# Patient Record
Sex: Male | Born: 1990 | Race: White | Hispanic: No | Marital: Single | Smoking: Current every day smoker
Health system: Southern US, Community
[De-identification: ages and names within clinical notes are randomized; demographics above are authoritative.]

## PROBLEM LIST (undated history)

## (undated) ENCOUNTER — Ambulatory Visit (HOSPITAL_COMMUNITY): Admission: EM | Payer: MEDICAID

## (undated) ENCOUNTER — Ambulatory Visit (HOSPITAL_COMMUNITY): Payer: 59

## (undated) DIAGNOSIS — K2920 Alcoholic gastritis without bleeding: Secondary | ICD-10-CM

## (undated) DIAGNOSIS — F329 Major depressive disorder, single episode, unspecified: Secondary | ICD-10-CM

## (undated) DIAGNOSIS — R443 Hallucinations, unspecified: Secondary | ICD-10-CM

## (undated) DIAGNOSIS — F32A Depression, unspecified: Secondary | ICD-10-CM

## (undated) DIAGNOSIS — F1010 Alcohol abuse, uncomplicated: Secondary | ICD-10-CM

## (undated) DIAGNOSIS — F259 Schizoaffective disorder, unspecified: Secondary | ICD-10-CM

## (undated) DIAGNOSIS — F319 Bipolar disorder, unspecified: Secondary | ICD-10-CM

## (undated) DIAGNOSIS — F1910 Other psychoactive substance abuse, uncomplicated: Secondary | ICD-10-CM

## (undated) DIAGNOSIS — K047 Periapical abscess without sinus: Secondary | ICD-10-CM

## (undated) DIAGNOSIS — I10 Essential (primary) hypertension: Secondary | ICD-10-CM

---
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
## 2012-10-10 ENCOUNTER — Encounter (HOSPITAL_COMMUNITY): Payer: Self-pay | Admitting: *Deleted

## 2012-10-10 ENCOUNTER — Emergency Department (HOSPITAL_COMMUNITY)
Admission: EM | Admit: 2012-10-10 | Discharge: 2012-10-10 | Disposition: A | Discharge status: Home / Self Care | Payer: Self-pay | Attending: Emergency Medicine | Admitting: Emergency Medicine

## 2012-10-10 DIAGNOSIS — K047 Periapical abscess without sinus: Secondary | ICD-10-CM | POA: Insufficient documentation

## 2012-10-10 DIAGNOSIS — F17200 Nicotine dependence, unspecified, uncomplicated: Secondary | ICD-10-CM | POA: Insufficient documentation

## 2012-10-10 MED ORDER — PENICILLIN V POTASSIUM 500 MG PO TABS: 500.0000 mg | ORAL_TABLET | Freq: Three times a day (TID) | ORAL | Status: DC

## 2012-10-10 NOTE — ED Notes (Addendum)
Left side of upper lip hard and swollen for 2-3 days. Airway patent. A&Ox4, ambulatory, stable. Pt reports hx of abscesses to gums on both sides of mouth.

## 2012-10-10 NOTE — ED Notes (Signed)
Reports having left upper lip swelling x 2 days, thinks its related to dental abscess. Airway is intact.

## 2012-10-10 NOTE — ED Provider Notes (Signed)
Medical screening examination/treatment/procedure(s) were performed by non-physician practitioner and as supervising physician I was immediately available for consultation/collaboration.  [name]. Rubin Payor, MD 10/10/12 [phone]

## 2012-10-10 NOTE — ED Provider Notes (Signed)
History   This chart was scribed for non-physician practitioner working with Mark Hammond. Mark Payor, MD by Mark Hammond, ED Scribe. This patient was seen in room TR08C/TR08C and the patient's care was started at 3:06 PM.    CSN: [id number]  Arrival date & time 10/10/12  1349   First MD Initiated Contact with Patient 10/10/12 1501      Chief Complaint  Patient presents with  . Oral Swelling    The history is provided by the patient. No language interpreter was used.   Mark Hammond is a 22 y.o. male who presents to the Emergency Department complaining of 2.5 days of upper lip swelling with one month of recurrent "abscess bubbles" that have previously been drained but recently they have not been able to drain.  Pt has not seen a dentist and has not tried any antibiotics.  Pt denies constant pain but states area is tender to touch.  No fever.  Pt has Codeine allergy.  Pt is a current everyday smoker but denies alcohol use.   History reviewed. No pertinent past medical history.  History reviewed. No pertinent past surgical history.  History reviewed. No pertinent family history.  History  Substance Use Topics  . Smoking status: Current Every Day Smoker    Types: Cigarettes  . Smokeless tobacco: Not on file  . Alcohol Use: No      Review of Systems  Constitutional: Negative for fever.  HENT: Positive for dental problem.     Allergies  Codeine  Home Medications  No current outpatient prescriptions on file.  BP 118/68  Pulse 73  Temp 97.8 F (36.6 C) (Oral)  Resp 18  SpO2 100%  Physical Exam  Nursing note and vitals reviewed. Constitutional: He is oriented to person, place, and time. He appears well-developed and well-nourished. No distress.  HENT:  Head: Normocephalic and atraumatic.  Mouth/Throat:         Mild swelling to upper lip, very poor dentition throughout, upper left rear molar has multiple carries, no signs of peritonsillar abscess, ludwig's angina.  Airway is  intact.  Eyes: Conjunctivae normal are normal.  Neck: Neck supple. No tracheal deviation present.  Cardiovascular: Normal rate.   Pulmonary/Chest: Effort normal. No respiratory distress.  Musculoskeletal: Normal range of motion.  Neurological: He is alert and oriented to person, place, and time.  Skin: Skin is warm and dry.  Psychiatric: He has a normal mood and affect. His behavior is normal.    ED Course  Procedures (including critical care time) DIAGNOSTIC STUDIES: Oxygen Saturation is 100% on room air, normal by my interpretation.    COORDINATION OF CARE: 3:09 PM- Patient informed of clinical course, understands medical decision-making process, and agrees with plan.  Discussed prescribing penicillin here and using ibuprofen/tylenol for pain management.  Discussed follow-up with dentist.      1. Dental abscess       MDM    I personally performed the services described in this documentation, which was scribed in my presence. The recorded information has been reviewed and is accurate.         Roxy Horseman, PA-C 10/10/12 2107

## 2012-11-07 ENCOUNTER — Encounter (HOSPITAL_COMMUNITY): Payer: Self-pay

## 2012-11-07 ENCOUNTER — Emergency Department (HOSPITAL_COMMUNITY)
Admission: EM | Admit: 2012-11-07 | Discharge: 2012-11-07 | Disposition: A | Discharge status: Home / Self Care | Payer: Self-pay | Attending: Emergency Medicine | Admitting: Emergency Medicine

## 2012-11-07 DIAGNOSIS — F17200 Nicotine dependence, unspecified, uncomplicated: Secondary | ICD-10-CM | POA: Insufficient documentation

## 2012-11-07 DIAGNOSIS — R599 Enlarged lymph nodes, unspecified: Secondary | ICD-10-CM | POA: Insufficient documentation

## 2012-11-07 DIAGNOSIS — R590 Localized enlarged lymph nodes: Secondary | ICD-10-CM

## 2012-11-07 DIAGNOSIS — K029 Dental caries, unspecified: Secondary | ICD-10-CM | POA: Insufficient documentation

## 2012-11-07 HISTORY — DX: Periapical abscess without sinus: K04.7

## 2012-11-07 MED ORDER — IBUPROFEN 400 MG PO TABS
600.0000 mg | ORAL_TABLET | Freq: Once | ORAL | Status: AC
  Administered 2012-11-07: 600 mg via ORAL
  Filled 2012-11-07: qty 2

## 2012-11-07 NOTE — ED Provider Notes (Signed)
History     CSN: [id number]  Arrival date & time 11/07/12  0118   First MD Initiated Contact with Patient 11/07/12 0130      Chief Complaint  Patient presents with  . Lymphadenopathy    (Consider location/radiation/quality/duration/timing/severity/associated sxs/prior treatment) HPIAdam Hammond is a 22 y.o. male presents to the emergency department for a small "bump" on the underside of his mandible. Patient was seen here in the ER 10/10/12 for a dental abscess and completed a course of penicillin. Patient denies any shortness of breath, difficulty breathing, swelling in his tongue or throat. Patient says he has mild sore throat when he swallows, and is mild, nonradiating irritation when he pushes on the bump under his jaw. He has no other medical history. He says he's not been able to followup with a dentist due to cost. Patient has not taken anything for the mild irritation    Past Medical History  Diagnosis Date  . Dental abscess     History reviewed. No pertinent past surgical history.  No family history on file.  History  Substance Use Topics  . Smoking status: Current Every Day Smoker    Types: Cigarettes  . Smokeless tobacco: Not on file  . Alcohol Use: No      Review of Systems At least 10pt or greater review of systems completed and are negative except where specified in the HPI.  Allergies  Codeine  Home Medications  No current outpatient prescriptions on file.  BP 120/69  Pulse 102  Temp 98.7 F (37.1 C) (Oral)  Resp 15  SpO2 98%  Physical Exam  Nursing notes reviewed.  Electronic medical record reviewed. VITAL SIGNS:   Filed Vitals:   11/07/12 0126  BP: 120/69  Pulse: 102  Temp: 98.7 F (37.1 C)  TempSrc: Oral  Resp: 15  SpO2: 98%   CONSTITUTIONAL: Awake, oriented, appears non-toxic. Smells of marijuana HENT: Atraumatic, normocephalic, oral mucosa pink and moist, airway patent. Dental caries. #30 is broken off at the gumline, no swelling of  the airway, no abscess, no Ludwig's angina, no RPA, no PTA. Nares patent without drainage. External ears normal. EYES: Conjunctiva clear, EOMI, PERRLA NECK: Trachea midline, non-tender, supple. Mild mostly right-sided submandibular shotty, mildly tender lymphadenopathy CARDIOVASCULAR: Normal heart rate, Normal rhythm, No murmurs, rubs, gallops PULMONARY/CHEST: Clear to auscultation, no rhonchi, wheezes, or rales. Symmetrical breath sounds. Non-tender. ABDOMINAL: Non-distended, soft, non-tender - no rebound or guarding.  BS normal. NEUROLOGIC: Non-focal, moving all four extremities, no gross sensory or motor deficits. EXTREMITIES: No clubbing, cyanosis, or edema SKIN: Warm, Dry, No erythema, No rash  ED Course  Procedures (including critical care time)  Labs Reviewed - No data to display No results found.   1. Lymphadenopathy, submandibular   2. Dental caries       MDM  Mark Hammond is a 22 y.o. male presents with some shotty lymphadenopathy in the submandibular nodes, patient has poor dentition and needs some dental work. Does not appear to have an abscess at this time, he just completed a course of penicillin-I. do not think he needs further antibiotic treatment. Besides a mildly elevated pulse of 102-patient's vital signs are within normal limits, he is afebrile and nontoxic.  Does not appear to have any severe or serious ENT infection. Patient will be given resources to both the [facility] as well as mother private dental clinic that operates for a reduced cost.  Patient to use ibuprofen as needed for discomfort, he says his  pain is mild and has not taken anything for it.  I explained the diagnosis and have given explicit precautions to return to the ER including difficulty breathing, mouth swelling, throat or tongue swelling, fevers or chills or any other new or worsening symptoms. The patient understands and accepts the medical plan as it's been dictated and I have  answered their questions. Discharge instructions concerning home care given.  The patient is STABLE and is discharged to home in good condition.         Jones Skene, MD 11/07/12 [phone]

## 2012-11-07 NOTE — ED Notes (Signed)
Pt presents with c/o a "knot" to left neck (?swollen lymph node) x 1 day. Was seen here 10/10/12 for dental abscess. Rx PCN in which pt stated he completed. Pt endorses " a little bit" of neck pain. No fevers, sweats or chills.

## 2013-02-03 ENCOUNTER — Encounter (HOSPITAL_COMMUNITY): Payer: Self-pay | Admitting: *Deleted

## 2013-02-03 ENCOUNTER — Emergency Department (HOSPITAL_COMMUNITY): Payer: Self-pay

## 2013-02-03 ENCOUNTER — Emergency Department (HOSPITAL_COMMUNITY)
Admission: EM | Admit: 2013-02-03 | Discharge: 2013-02-03 | Disposition: A | Discharge status: Home / Self Care | Payer: Self-pay | Attending: Emergency Medicine | Admitting: Emergency Medicine

## 2013-02-03 DIAGNOSIS — R0789 Other chest pain: Secondary | ICD-10-CM | POA: Insufficient documentation

## 2013-02-03 DIAGNOSIS — J40 Bronchitis, not specified as acute or chronic: Secondary | ICD-10-CM

## 2013-02-03 DIAGNOSIS — F17200 Nicotine dependence, unspecified, uncomplicated: Secondary | ICD-10-CM | POA: Insufficient documentation

## 2013-02-03 DIAGNOSIS — R0602 Shortness of breath: Secondary | ICD-10-CM | POA: Insufficient documentation

## 2013-02-03 DIAGNOSIS — Z8719 Personal history of other diseases of the digestive system: Secondary | ICD-10-CM | POA: Insufficient documentation

## 2013-02-03 MED ORDER — ALBUTEROL SULFATE HFA 108 (90 BASE) MCG/ACT IN AERS
2.0000 | INHALATION_SPRAY | RESPIRATORY_TRACT | Status: DC | PRN
  Administered 2013-02-03: 2 via RESPIRATORY_TRACT
  Filled 2013-02-03: qty 6.7

## 2013-02-03 NOTE — ED Provider Notes (Signed)
History  This chart was scribed for non-physician practitioner Renne Crigler, PA-C working with Nelia Shi, MD, by Candelaria Stagers, ED Scribe. This patient was seen in room WTR5/WTR5 and the patient's care was started at 5:15 PM   CSN: [id number]  Arrival date & time 02/03/13  1647   First MD Initiated Contact with Patient 02/03/13 1708      Chief Complaint  Patient presents with  . Cough  . Chest Pain  . Shortness of Breath     The history is provided by the patient. No language interpreter was used.   Mark Hammond is a 22 y.o. male who presents to the Emergency Department complaining of sudden onset of intermittent chest pain that started a little over a week ago to the left chest and mid chest.  Pt has also experienced SOB.  Pain moves around and is in different places at different times. He has no h/o asthma.  Pt smokes and has experienced a cough related to smoking.  He has taken nothing for the pain.  Nothing seems to make the sx better or worse.  He is anxious about what it could be. He denies leg swelling, recent travel, or recent surgery.  He has no h/o blood clots.  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 Cocaine does not seem to bring on the pain. Pain is not made worse with deep breathing. The onset of this condition was acute. The course is intermittent. Aggravating factors: none. Alleviating factors: none.     Past Medical History  Diagnosis Date  . Dental abscess     History reviewed. No pertinent past surgical history.  No family history on file.  History  Substance Use Topics  . Smoking status: Current Every Day Smoker    Types: Cigarettes  . Smokeless tobacco: Never Used  . Alcohol Use: No      Review of Systems  Constitutional: Negative for fever and diaphoresis.  HENT: Negative for neck pain.   Eyes: Negative for redness.  Respiratory: Positive for cough (associated with smoking) and shortness of breath.    Cardiovascular: Positive for chest pain. Negative for palpitations and leg swelling.  Gastrointestinal: Negative for nausea, vomiting and abdominal pain.  Genitourinary: Negative for dysuria.  Musculoskeletal: Negative for back pain.  Skin: Negative for rash.  Neurological: Negative for syncope and light-headedness.    Allergies  Codeine  Home Medications  No current outpatient prescriptions on file.  BP 116/70  Pulse 78  Temp(Src) 98.5 F (36.9 C) (Oral)  Resp 16  SpO2 97%  Physical Exam  Nursing note and vitals reviewed. Constitutional: He is oriented to person, place, and time. He appears well-developed and well-nourished. No distress.  HENT:  Head: Normocephalic and atraumatic.  Mouth/Throat: Mucous membranes are normal. Mucous membranes are not dry.  Eyes: Conjunctivae and EOM are normal.  Neck: Trachea normal and normal range of motion. Neck supple. Normal carotid pulses and no JVD present. No muscular tenderness present. Carotid bruit is not present. No tracheal deviation present.  Cardiovascular: Normal rate, regular rhythm, S1 normal, S2 normal, normal heart sounds and intact distal pulses.  Exam reveals no distant heart sounds and no decreased pulses.   No murmur heard. Pulmonary/Chest: Effort normal. No respiratory distress. He has wheezes (mild expiratory). He has no rales. He exhibits no tenderness.  Abdominal: Soft. Normal aorta and bowel sounds are normal. There is no tenderness. There is no rebound and no guarding.  Musculoskeletal: Normal range of motion. He exhibits no edema.  Neurological: He is alert and oriented to person, place, and time.  Skin: Skin is warm and dry. He is not diaphoretic. No cyanosis. No pallor.  Psychiatric: He has a normal mood and affect. His behavior is normal.    ED Course  Procedures   DIAGNOSTIC STUDIES: Oxygen Saturation is 97% on room air, normal by my interpretation.    COORDINATION OF CARE:  5:19 PM Discussed course  of care with pt which includes chest xray.  Pt understands and agrees.    Labs Reviewed - No data to display Dg Chest 2 View  02/03/2013  *RADIOLOGY REPORT*  Clinical Data: Left chest pain.  CHEST - 2 VIEW  Comparison: None.  Findings: Heart and mediastinal contours are within normal limits. No focal opacities or effusions.  No acute bony abnormality.  Mild hyperinflation and peribronchial thickening.  IMPRESSION:  Mild bronchitic changes.   Original Report Authenticated By: Charlett Nose, M.D.      1. Bronchitis      Date: 02/03/2013  Rate: 71  Rhythm: normal sinus rhythm  QRS Axis: normal  Intervals: normal  ST/T Wave abnormalities: normal  Conduction Disutrbances:none  Narrative Interpretation:   Old EKG Reviewed: none available  Patient seen and examined. Work-up initiated.   Vital signs reviewed and are as follows: Filed Vitals:   02/03/13 1919  BP: 121/74  Pulse: 67  Temp:   Resp: 18   Results reviewed by myself, patient informed.   Counseled on neg side effects of cocaine use and cardiac risks. Pt urged to stop.   Patient was counseled to return with severe chest pain, especially if the pain is crushing or pressure-like and spreads to the arms, back, neck, or jaw, or if they have sweating, nausea, or shortness of breath with the pain. They were encouraged to call 911 with these symptoms.   They were also told to return if their chest pain gets worse and does not go away with rest, they have an attack of chest pain lasting longer than usual despite rest and treatment with the medications their caregiver has prescribed, if they wake from sleep with chest pain or shortness of breath, if they feel dizzy or faint, if they have chest pain not typical of their usual pain, or if they have any other emergent concerns regarding their health.  The patient verbalized understanding and agreed.   Patient counseled on use of albuterol HFA.  Told to use 1-2 puffs q 4 hours as needed for  SOB.    MDM  Chest pain: likely 2/2 bronchitis, smoking. Pain is atypical for cardiac. I do not feel this is cocaine-induced given details of history. EKG reassuring. No elevation in troponin in patient that is nearly 24 hours out from last use of cocaine. Will treat for bronchitis. Patient appears well, non-toxic, no respiratory distress. Patient counseled to d/c substance use.   I personally performed the services described in this documentation, which was scribed in my presence. The recorded information has been reviewed and is accurate.       Renne Crigler, PA-C 02/03/13 2005

## 2013-02-03 NOTE — ED Notes (Signed)
Pt states for the past week and half has had intermittent sharp chest pains, states switches sides at times, has had some cough and congestion and shortness of breath, denies n/v, denies dizziness.

## 2013-02-04 NOTE — ED Provider Notes (Signed)
Medical screening examination/treatment/procedure(s) were performed by non-physician practitioner and as supervising physician I was immediately available for consultation/collaboration.   Nelia Shi, MD 02/04/13 [phone]

## 2014-05-14 ENCOUNTER — Encounter (HOSPITAL_COMMUNITY): Payer: Self-pay | Admitting: Emergency Medicine

## 2014-05-14 ENCOUNTER — Emergency Department (HOSPITAL_COMMUNITY)
Admission: EM | Admit: 2014-05-14 | Discharge: 2014-05-14 | Disposition: A | Discharge status: Home / Self Care | Payer: Self-pay | Attending: Emergency Medicine | Admitting: Emergency Medicine

## 2014-05-14 DIAGNOSIS — F17200 Nicotine dependence, unspecified, uncomplicated: Secondary | ICD-10-CM | POA: Insufficient documentation

## 2014-05-14 DIAGNOSIS — Z8719 Personal history of other diseases of the digestive system: Secondary | ICD-10-CM | POA: Insufficient documentation

## 2014-05-14 DIAGNOSIS — R21 Rash and other nonspecific skin eruption: Secondary | ICD-10-CM | POA: Insufficient documentation

## 2014-05-14 MED ORDER — HYDROCORTISONE 2.5 % EX LOTN: TOPICAL_LOTION | Freq: Two times a day (BID) | CUTANEOUS | Status: DC

## 2014-05-14 NOTE — ED Provider Notes (Signed)
CSN: [id number]     Arrival date & time 05/14/14  1656 History  This chart was scribed for a non-physician practitioner, Santiago GladHeather Amiliana Foutz, PA-C, working with Toy CookeyMegan Docherty, MD by Julian HyMorgan Graham, ED Scribe. The patient was seen in WTR7/WTR7. The patient's care was started at 6:36 PM.    Chief Complaint  Patient presents with  . Rash   The history is provided by the patient. No language interpreter was used.   HPI Comments: Mark Hammond is a 23 y.o. male who presents to the Emergency Department complaining of new, gradually worsening rash in the axilla bilaterally onset 3 days ago. Pt reports associated itching, burning when touched, and mild redness at the rash sites. Pt states this the first time he's had these symptoms. Pt reports he switched to fragrance free laundry detergent, fragrance free soap and has stopped using deodorant to relieve his symptoms with no resolve. Pt reports he had recently switched to using Old Spice deodorant before his symptoms began. Pt reports using Lamisil Defense and Lotrimin Ultra with no relief. Pt denies drainage from the rash. Pt also denies fever, chills, nausea or vomiting.   Past Medical History  Diagnosis Date  . Dental abscess    History reviewed. No pertinent past surgical history. No family history on file. History  Substance Use Topics  . Smoking status: Current Every Day Smoker    Types: Cigarettes  . Smokeless tobacco: Never Used  . Alcohol Use: No    Review of Systems  Constitutional: Negative for chills.  Neurological: Negative for numbness.   Allergies  Codeine  Home Medications   Prior to Admission medications   Not on File   Triage Vitals: BP 102/63  Pulse 92  Temp(Src) 98.5 F (36.9 C) (Oral)  Resp 16  SpO2 100% Physical Exam  Nursing note and vitals reviewed. Constitutional: He appears well-developed and well-nourished. No distress.  HENT:  Head: Normocephalic and atraumatic.  Mouth/Throat: Oropharynx is clear and  moist.  Neck: Normal range of motion. Neck supple.  Cardiovascular: Normal rate, regular rhythm and normal heart sounds.   Pulmonary/Chest: Effort normal and breath sounds normal. He has no wheezes.  Musculoskeletal: Normal range of motion. He exhibits no edema.  Neurological: He is alert.  Skin: Rash noted.  Erythematous, papular, well-circumcised of the axilla bilaterally    ED Course  Procedures (including critical care time) DIAGNOSTIC STUDIES: Oxygen Saturation is 100% on RA, normal by my interpretation.    COORDINATION OF CARE: 6:43 PM- Patient informed of current plan for treatment and evaluation and agrees with plan at this time.  MDM   Final diagnoses:  None   Patient with an erythematous papular rash of the axilla bilaterally.  Suspect Contact Dermatitis.  Patient given Rx for Hydrocortisone and instructed to stop using the deodorant    Santiago GladHeather Dameir Gentzler, PA-C 05/14/14 2017

## 2014-05-14 NOTE — ED Notes (Signed)
Per pt/friend-has rash under both arms-used antifungal spray and now it is worse

## 2014-05-14 NOTE — Discharge Instructions (Signed)

## 2014-05-15 NOTE — ED Provider Notes (Signed)
Medical screening examination/treatment/procedure(s) were performed by non-physician practitioner and as supervising physician I was immediately available for consultation/collaboration.   Megan Docherty, MD 05/15/14 0005 

## 2015-01-17 ENCOUNTER — Emergency Department (HOSPITAL_COMMUNITY)
Admission: EM | Admit: 2015-01-17 | Discharge: 2015-01-17 | Disposition: A | Discharge status: Home / Self Care | Payer: Self-pay | Attending: Emergency Medicine | Admitting: Emergency Medicine

## 2015-01-17 ENCOUNTER — Encounter (HOSPITAL_COMMUNITY): Payer: Self-pay | Admitting: *Deleted

## 2015-01-17 DIAGNOSIS — S30850A Superficial foreign body of lower back and pelvis, initial encounter: Secondary | ICD-10-CM | POA: Insufficient documentation

## 2015-01-17 DIAGNOSIS — Z8719 Personal history of other diseases of the digestive system: Secondary | ICD-10-CM | POA: Insufficient documentation

## 2015-01-17 DIAGNOSIS — Y999 Unspecified external cause status: Secondary | ICD-10-CM | POA: Insufficient documentation

## 2015-01-17 DIAGNOSIS — Z7982 Long term (current) use of aspirin: Secondary | ICD-10-CM | POA: Insufficient documentation

## 2015-01-17 DIAGNOSIS — Y929 Unspecified place or not applicable: Secondary | ICD-10-CM | POA: Insufficient documentation

## 2015-01-17 DIAGNOSIS — Y939 Activity, unspecified: Secondary | ICD-10-CM | POA: Insufficient documentation

## 2015-01-17 DIAGNOSIS — W458XXA Other foreign body or object entering through skin, initial encounter: Secondary | ICD-10-CM | POA: Insufficient documentation

## 2015-01-17 DIAGNOSIS — Z720 Tobacco use: Secondary | ICD-10-CM | POA: Insufficient documentation

## 2015-01-17 DIAGNOSIS — T148XXA Other injury of unspecified body region, initial encounter: Secondary | ICD-10-CM

## 2015-01-17 MED ORDER — BACITRACIN ZINC 500 UNIT/GM EX OINT: 1.0000 "application " | TOPICAL_OINTMENT | Freq: Two times a day (BID) | CUTANEOUS | Status: DC

## 2015-01-17 MED ORDER — LIDOCAINE-EPINEPHRINE (PF) 2 %-1:200000 IJ SOLN
10.0000 mL | Freq: Once | INTRAMUSCULAR | Status: AC
  Administered 2015-01-17: 10 mL
  Filled 2015-01-17: qty 20

## 2015-01-17 NOTE — Discharge Instructions (Signed)
Sliver Removal °You have had a sliver (splinter) removed. This has caused a wound that extends through some or all layers of the skin and possibly into the subcutaneous tissue. This is the tissue just beneath the skin. Because these wounds can not be cleaned well, it is necessary to watch closely for infection. °AFTER THE PROCEDURE  °If a cut (incision) was necessary to remove this, it may have been repaired for you by your caregiver either with suturing, stapling, or adhesive strips. These keep together the skin edges and allow better and faster healing. °HOME CARE INSTRUCTIONS  °· A dressing may have been applied. This may be changed once per day or as instructed. If the dressing sticks, it may be soaked off with a gauze pad or clean cloth that has been dampened with soapy water or hydrogen peroxide. °· It is difficult to remove all slivers or foreign bodies as they may break or splinter into smaller pieces. Be aware that your body will work to remove the foreign substance. That is, the foreign body may work itself out of the wound. That is normal. °· Watch for signs of infection and notify your caregiver if you suspect a sliver or foreign body remains in the wound. °· You may have received a recommendation to follow up with your physician or a specialist. It is very important to call for or keep follow-up appointments in order to avoid infection or other complications. °· Only take over-the-counter or prescription medicines for pain, discomfort, or fever as directed by your caregiver. °· If antibiotics were prescribed, be sure to finish all of the medicine. °If you did not receive a tetanus shot today because you did not recall when your last one was given, check with your caregiver in the next day or two during follow up to determine if one is needed. °SEEK MEDICAL CARE IF:  °· The area around the wound has new or worsening redness or tenderness. °· Pus is coming from the wound °· There is a foul smell from the  wound or dressing °· The edges of a wound that had been repaired break open °SEEK IMMEDIATE MEDICAL CARE IF:  °· Red streaks are coming from the wound °· An unexplained oral temperature above 102° F (38.9° C) develops. °Document Released: 09/16/2000 Document Revised: 12/12/2011 Document Reviewed: 05/05/2008 °Exit[employer]® Patient Information ©2015 [employer]. This information is not intended to replace advice given to you by your health care provider. Make sure you discuss any questions you have with your health care provider. ° °Wood Splinters °Wood splinters need to be removed because they can cause skin irritation and infection. If they are close to the surface, splinters can usually be removed easily. Deep splinters may be hard to locate and need treatment by a surgeon. °SPLINTER REMOVAL °Removal of splinters by your caregiver is considered a surgical procedure.  °· The area is carefully cleaned. You may require a small amount of anesthesia (medicine injected near the splinter to numb the tissue and lessen pain). After the splinter is removed, the area will be cleaned again. A bandage is applied. °· If your splinter is under a fingernail or toenail, then a small section of the nail may need to be removed. As long as the splinter did not extend to the base of the nail, the nail usually grows back normally. °· A splinter that is deeper, more contaminated, or that gets near a structure such as a bone, nerve or blood vessel may need to be   removed by a surgeon. °· You may need special X-rays or scans if the splinter is hard to locate. °· Every attempt is made to remove the entire splinter. However, small particles may remain. Tell your caregiver if you feel that a part of the splinter was left behind. °HOME CARE INSTRUCTIONS  °· Keep the injured area high up (elevated). °· Use the injured area as little as possible. °· Keep the injured area clean and dry. Follow any directions from your caregiver. °· Keep any  follow-up or wound check appointments. °You might need a tetanus shot now if: °· You have no idea when you had the last one. °· You have never had a tetanus shot before. °· The injured area had dirt in it. °Even if you have already removed the splinter, call your caregiver to get a tetanus shot if you need one.  °If you need a tetanus shot, and you decide not to get one, there is a rare chance of getting tetanus. Sickness from tetanus can be serious. If you did get a tetanus shot, your arm may swell, get red and warm to the touch at the shot site. This is common and not a problem. °SEEK MEDICAL CARE IF:  °· A splinter has been removed, but you are not better in a day or two. °· You develop a temperature. °· Signs of infection develop such as: °¨ Redness, swelling or pus around the wound. °¨ Red streaks spreading back from your wound towards your body. °Document Released: 10/27/2004 Document Revised: 02/03/2014 Document Reviewed: 09/29/2008 °ExitCare® Patient Information ©2015 [employer]. This information is not intended to replace advice given to you by your health care provider. Make sure you discuss any questions you have with your health care provider. ° °

## 2015-01-17 NOTE — ED Notes (Signed)
Pt. Left with all belongings and refused wheelchair 
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
## 2015-01-17 NOTE — ED Notes (Signed)
Suture cart at the bedside.  
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
## 2015-01-27 ENCOUNTER — Emergency Department (HOSPITAL_COMMUNITY): Payer: Self-pay

## 2015-01-27 ENCOUNTER — Encounter (HOSPITAL_COMMUNITY): Payer: Self-pay | Admitting: Family Medicine

## 2015-01-27 ENCOUNTER — Emergency Department (HOSPITAL_COMMUNITY)
Admission: EM | Admit: 2015-01-27 | Discharge: 2015-01-27 | Disposition: A | Discharge status: Home / Self Care | Payer: Self-pay | Attending: Emergency Medicine | Admitting: Emergency Medicine

## 2015-01-27 DIAGNOSIS — Z8719 Personal history of other diseases of the digestive system: Secondary | ICD-10-CM | POA: Insufficient documentation

## 2015-01-27 DIAGNOSIS — F1210 Cannabis abuse, uncomplicated: Secondary | ICD-10-CM | POA: Insufficient documentation

## 2015-01-27 DIAGNOSIS — Z7952 Long term (current) use of systemic steroids: Secondary | ICD-10-CM | POA: Insufficient documentation

## 2015-01-27 DIAGNOSIS — F1510 Other stimulant abuse, uncomplicated: Secondary | ICD-10-CM | POA: Insufficient documentation

## 2015-01-27 DIAGNOSIS — Z720 Tobacco use: Secondary | ICD-10-CM | POA: Insufficient documentation

## 2015-01-27 DIAGNOSIS — R079 Chest pain, unspecified: Secondary | ICD-10-CM | POA: Insufficient documentation

## 2015-01-27 DIAGNOSIS — Z792 Long term (current) use of antibiotics: Secondary | ICD-10-CM | POA: Insufficient documentation

## 2015-01-27 DIAGNOSIS — Z7982 Long term (current) use of aspirin: Secondary | ICD-10-CM | POA: Insufficient documentation

## 2015-01-27 LAB — CBC WITH DIFFERENTIAL/PLATELET
BASOS ABS: 0.1 10*3/uL (ref 0.0–0.1)
Basophils Relative: 1 % (ref 0–1)
EOS ABS: 0.1 10*3/uL (ref 0.0–0.7)
EOS PCT: 1 % (ref 0–5)
HCT: 42.4 % (ref 39.0–52.0)
Hemoglobin: 15 g/dL (ref 13.0–17.0)
LYMPHS ABS: 2.3 10*3/uL (ref 0.7–4.0)
LYMPHS PCT: 27 % (ref 12–46)
MCH: 31.3 pg (ref 26.0–34.0)
MCHC: 35.4 g/dL (ref 30.0–36.0)
MCV: 88.3 fL (ref 78.0–100.0)
MONO ABS: 0.6 10*3/uL (ref 0.1–1.0)
MONOS PCT: 8 % (ref 3–12)
NEUTROS PCT: 63 % (ref 43–77)
Neutro Abs: 5.4 10*3/uL (ref 1.7–7.7)
Platelets: 244 10*3/uL (ref 150–400)
RBC: 4.8 MIL/uL (ref 4.22–5.81)
RDW: 13 % (ref 11.5–15.5)
WBC: 8.5 10*3/uL (ref 4.0–10.5)

## 2015-01-27 LAB — RAPID URINE DRUG SCREEN, HOSP PERFORMED
AMPHETAMINES: POSITIVE — AB
BARBITURATES: NOT DETECTED
Benzodiazepines: NOT DETECTED
Cocaine: NOT DETECTED
Opiates: NOT DETECTED
Tetrahydrocannabinol: POSITIVE — AB

## 2015-01-27 LAB — ETHANOL: Alcohol, Ethyl (B): <5 mg/dL (ref 0–9)

## 2015-01-27 LAB — BASIC METABOLIC PANEL
Anion gap: 3 — ABNORMAL LOW (ref 5–15)
BUN: 9 mg/dL (ref 6–23)
CALCIUM: 8.4 mg/dL (ref 8.4–10.5)
CO2: 26 mmol/L (ref 19–32)
CREATININE: 0.69 mg/dL (ref 0.50–1.35)
Chloride: 105 mmol/L (ref 96–112)
GLUCOSE: 110 mg/dL — AB (ref 70–99)
Potassium: 3.6 mmol/L (ref 3.5–5.1)
SODIUM: 134 mmol/L — AB (ref 135–145)

## 2015-01-27 LAB — I-STAT TROPONIN, ED: TROPONIN I, POC: 0 ng/mL (ref 0.00–0.08)

## 2015-01-27 NOTE — ED Notes (Signed)
MD at bedside. 

## 2015-01-27 NOTE — ED Notes (Signed)
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 Patient was given VERSED 5mg  IV enroute to help calm patient down. Patient denies SI/HI. Complains of chest pain with a heart as high as 160bpm.

## 2015-01-27 NOTE — ED Notes (Signed)
Bed: RESB Expected date:  Expected time:  Means of arrival:  Comments: EMS 13M "Tripping on Meth"

## 2015-01-27 NOTE — Discharge Instructions (Signed)
Chest Pain (Nonspecific) °It is often hard to give a specific diagnosis for the cause of chest pain. There is always a chance that your pain could be related to something serious, such as a heart attack or a blood clot in the lungs. You need to follow up with your health care provider for further evaluation. °CAUSES  °· Heartburn. °· Pneumonia or bronchitis. °· Anxiety or stress. °· Inflammation around your heart (pericarditis) or lung (pleuritis or pleurisy). °· A blood clot in the lung. °· A collapsed lung (pneumothorax). It can develop suddenly on its own (spontaneous pneumothorax) or from trauma to the chest. °· Shingles infection (herpes zoster virus). °The chest wall is composed of bones, muscles, and cartilage. Any of these can be the source of the pain. °· The bones can be bruised by injury. °· The muscles or cartilage can be strained by coughing or overwork. °· The cartilage can be affected by inflammation and become sore (costochondritis). °DIAGNOSIS  °Lab tests or other studies may be needed to find the cause of your pain. Your health care provider may have you take a test called an ambulatory electrocardiogram (ECG). An ECG records your heartbeat patterns over a 24-hour period. You may also have other tests, such as: °· Transthoracic echocardiogram (TTE). During echocardiography, sound waves are used to evaluate how blood flows through your heart. °· Transesophageal echocardiogram (TEE). °· Cardiac monitoring. This allows your health care provider to monitor your heart rate and rhythm in real time. °· Holter monitor. This is a portable device that records your heartbeat and can help diagnose heart arrhythmias. It allows your health care provider to track your heart activity for several days, if needed. °· Stress tests by exercise or by giving medicine that makes the heart beat faster. °TREATMENT  °· Treatment depends on what may be causing your chest pain. Treatment may include: °¨ Acid blockers for  heartburn. °¨ Anti-inflammatory medicine. °¨ Pain medicine for inflammatory conditions. °¨ Antibiotics if an infection is present. °· You may be advised to change lifestyle habits. This includes stopping smoking and avoiding alcohol, caffeine, and chocolate. °· You may be advised to keep your head raised (elevated) when sleeping. This reduces the chance of acid going backward from your stomach into your esophagus. °Most of the time, nonspecific chest pain will improve within 2-3 days with rest and mild pain medicine.  °HOME CARE INSTRUCTIONS  °· If antibiotics were prescribed, take them as directed. Finish them even if you start to feel better. °· For the next few days, avoid physical activities that bring on chest pain. Continue physical activities as directed. °· Do not use any tobacco products, including cigarettes, chewing tobacco, or electronic cigarettes. °· Avoid drinking alcohol. °· Only take medicine as directed by your health care provider. °· Follow your health care provider's suggestions for further testing if your chest pain does not go away. °· Keep any follow-up appointments you made. If you do not go to an appointment, you could develop lasting (chronic) problems with pain. If there is any problem keeping an appointment, call to reschedule. °SEEK MEDICAL CARE IF:  °· Your chest pain does not go away, even after treatment. °· You have a rash with blisters on your chest. °· You have a fever. °SEEK IMMEDIATE MEDICAL CARE IF:  °· You have increased chest pain or pain that spreads to your arm, neck, jaw, back, or abdomen. °· You have shortness of breath. °· You have an increasing cough, or you cough   up blood.  You have severe back or abdominal pain.  You feel nauseous or vomit.  You have severe weakness.  You faint.  You have chills. This is an emergency. Do not wait to see if the pain will go away. Get medical help at once. Call your local emergency services (911 in U.[place].). Do not drive  yourself to the hospital. MAKE SURE YOU:   Understand these instructions.  Will watch your condition.  Will get help right away if you are not doing well or get worse. Document Released: 06/29/2005 Document Revised: 09/24/2013 Document Reviewed: 04/24/2008 [facility] Patient Information 2015 [place], [place]. This information is not intended to replace advice given to you by your health care provider. Make sure you discuss any questions you have with your health care provider. Stimulant Use Disorder-Amphetamines  Amphetamines are one of a group of powerful drugs known as stimulants. Amphetamines have a number of medical uses, including the treatment of a daytime sleepiness disorder due to narcolepsy or sleep apnea, attention deficit hyperactivity disorder, and chronic fatigue syndrome. However, amphetamines also are often misused because of the effects they produce. These effects include:  A feeling of extreme pleasure (euphoria).  Alertness.  Increased attention.  High energy.  Loss of appetite for weight loss. Common street names for these drugs include speed and crank. Amphetamines are taken by mouth, crushed and snorted, or dissolved in water and injected. Stimulants are addictive because they activate regions of the brain that are responsible for producing both the pleasurable sensation of "reward" and psychological dependence. Together, these actions account for loss of control and the rapid development of drug dependence. This means you will become ill without the drug (withdrawal) and need to keep using it to function.  Stimulant use disorder is use of stimulants that disrupts your daily life. It disrupts relationships with family and friends and how you do your job. Amphetamines increase blood pressure and heart rate. Use can lead to heart attack or stroke. Use can also cause death from irregular heart rate, seizures, or dangerously high body temperature. SIGNS AND SYMPTOMS  Symptoms  of stimulant use disorder with amphetamines include:  Use of amphetamines in larger amounts or over a longer period than intended.  Unsuccessful attempts to cut down or control amphetamine use.  A lot of time spent obtaining, using, or recovering from the effects of amphetamines.  A strong desire or urge to use amphetamines (craving).  Continued use of amphetamines in spite of major problems at work, school, or home because of use.  Continued use of amphetamines in spite of relationship problems because of use.  Giving up or cutting down on important life activities because of amphetamine use.  Use of amphetamines over and over in situations when it is physically hazardous, such as driving a car.  Continued use of amphetamines in spite of a physical problem that is likely related to amphetamine use. Physical problems can include:  Unintended weight loss.  High blood pressure.  Chest pain.  Infections such as human immunodeficiency virus and hepatitis (from injecting amphetamines).  Continued use of amphetamines in spite of mental problems that are likely related to use. Mental problems can include:  Anxiety.  Sleep problems.  Schizophrenia-like symptoms.  Depression.  Bipolar mood swings.  Violent behavior.  Need to use more and more amphetamines to get the same effect, or lessened effect over time with use of the same amount (tolerance).  Having withdrawal symptoms when amphetamine use is stopped, or using amphetamines  to reduce or avoid withdrawal symptoms. Withdrawal symptoms include:  Depressed mood.  Low energy or restlessness.  Bad dreams.  Too little or too much sleep.  Increased appetite. DIAGNOSIS  Stimulant use disorder is diagnosed by your health care provider. You may be asked questions about your amphetamine use and how it affects your life. A physical exam may be done. A drug screen may be ordered. You may be referred to a mental health  professional. The diagnosis of stimulant use disorder requires two or more symptoms within 12 months. The type of stimulant use disorder you have depends on the number of signs and symptoms you have. The type may be:  Mild. Two or three signs and symptoms.  Moderate. Four or five signs and symptoms.  Severe. Six or more signs and symptoms. TREATMENT  The treatment for most problems related to stimulant use disorder with amphetamines may be divided into two types:  Short-term medical treatment. This helps to preserve life and prevent or minimize damage from physical or mental problems related to use.  Long-term substance abuse treatment. This focuses on recovery from use disorder. It is provided by mental health professionals who have training in substance use disorders. It is usually a combination of counseling, support groups, and nonaddictive medicines that can reduce cravings or block the effects of amphetamines. HOME CARE INSTRUCTIONS   Take medicines only as directed by your health care provider.  Identify the people and activities that trigger your amphetamine use and avoid them.  Keep all follow-up visits as directed by your health care provider. SEEK MEDICAL CARE IF:  Your symptoms get worse or you relapse.  You are not able to take medicines as directed. SEEK IMMEDIATE MEDICAL CARE IF:   You have serious thoughts about hurting yourself or others.  You have a seizure, chest pain, sudden weakness, or loss of speech or vision. FOR MORE INFORMATION  National Institute on Drug Abuse: http://www.price-smith.com/  Substance Abuse and Mental Health Services Administration: SkateOasis.com.pt Document Released: 09/13/2001 Document Revised: 02/03/2014 Document Reviewed: 10/02/2013 [facility] Patient Information 2015 [place], [place]. This information is not intended to replace advice given to you by your health care provider. Make sure you discuss any questions you have with your health care  provider.   Emergency Department Resource Guide 1) Find a Doctor and Pay Out of Pocket Although you won't have to find out who is covered by your insurance plan, it is a good idea to ask around and get recommendations. You will then need to call the office and see if the doctor you have chosen will accept you as a new patient and what types of options they offer for patients who are self-pay. Some doctors offer discounts or will set up payment plans for their patients who do not have insurance, but you will need to ask so you aren't surprised when you get to your appointment.  2) Contact Your Local Health Department Not all health departments have doctors that can see patients for sick visits, but many do, so it is worth a call to see if yours does. If you don't know where your local health department is, you can check in your phone book. The CDC also has a tool to help you locate your state's health department, and many state websites also have listings of all of their local health departments.  3) Find a Walk-in Clinic If your illness is not likely to be very severe or complicated, you may want to try a walk  in clinic. These are popping up all over the country in pharmacies, drugstores, and shopping centers. They're usually staffed by nurse practitioners or physician assistants that have been trained to treat common illnesses and complaints. They're usually fairly quick and inexpensive. However, if you have serious medical issues or chronic medical problems, these are probably not your best option.  No Primary Care Doctor: - Call Health Connect at  [phone] - they can help you locate a primary care doctor that  accepts your insurance, provides certain services, etc. - Physician Referral Service- [phone]  Chronic Pain Problems: Organization         Address  Phone   Notes  [facility]  [phone] Patients need to be referred by their primary care doctor.    Medication Assistance: Organization         Address  Phone   Notes  [facility] Medication [facility] [address] [place] [place], [place] [address] [phone] --Must be a resident of [facility] -- Must have NO insurance coverage whatsoever (no Medicaid/ Medicare, etc.) -- The pt. MUST have a primary care doctor that directs their care regularly and follows them in the community   Med[facility]ist  [phone]   [employer]  [phone]    Agencies that provide inexpensive medical care: Organization         Address  Phone   Notes  [facility]  [phone]   [facility]    [phone]   [facility] [address] [place], [place] [address] [phone]   [facility] [address] [place] [phone]   Planned Parenthood    [phone]   [facility]    [phone]   [facility]  [address], [place] Phone:  [phone], Fax:  [phone] Hours of Operation:  9 am - 6 pm, M-F.  Also accepts Medicaid/Medicare and self-pay.  [facility] for Children  [address], [place] Phone: [phone], Fax: [phone]. Hours of Operation:  8:30 am - 5:30 pm, M-F.  Also accepts Medicaid and self-pay.  [facility] [place] [address] [place] Phone: [phone]   [facility] [address] Natasha Bence [place], [place] [phone], Ext. [phone] Mondays & Thursdays: 7-9 AM.  First 15 patients are seen on a first come, first serve basis.    Medicaid-accepting [facility] Providers:  Organization         Address  Phone   Notes  [facility] [address], [place] [phone] Also accepts self-pay patients.  [facility] [address] Laurell Josephs [place], [place]  [phone]   [facility] [address], [place] [phone]   [facility] [address] [place] [phone]   Renaye Rakers [address], [place]   [phone] Only accepts [place] patients after they have their name applied to their card.   Self-Pay (no insurance) in [facility]:  Organization         Address  Phone   Notes  Sickle Cell Patients, [facility] [address] [place], [place] [phone]   [facility] Care [address] [place], [place] [phone]   [facility] [place]  [address], [place] [phone]   [facility]/Dr. Osei-Bonsu  [address] [place] [address], [place] or [address], [place] [phone] Phone number for both [place] and [place] locations is the same.  [facility] [address], [place] [phone]) [phone]   [facility] [place] [address] [place] [address], [place] or [address] [phone]) [phone]   [facility] [address], [place] [phone]) [phone], phone; [phone], fax Sees patients 1st and 3rd Saturday of every month.  Must not qualify for public or private insurance (i.e. Medicaid, Medicare, [place] Health Choice, Veterans' Benefits)  Household income should be no more than 200% of the poverty level The clinic cannot treat you if you are pregnant or think you are pregnant  Sexually transmitted diseases are not treated at the clinic.    Dental Care: Organization         Address  Phone  Notes  [facility] [address], [place] [phone] Accepts children up to age 24 who are enrolled in [place] or [place] Health Choice; pregnant women with a Medicaid card; and children who have applied for Medicaid or [place] Health Choice, but were declined, whose parents can pay a reduced fee at time of service.    [facility] [place]  [address], [place] [phone] Accepts children up to age 24 who are enrolled in [place] or [place] Health Choice; pregnant women with a Medicaid card; and children who have applied for Medicaid or [place] Health Choice, but were declined, whose parents can pay a reduced fee at time of service.  [facility]  [address], [place] [phone] Patients are seen by appointment only. Walk-ins are not accepted. [facility] will see patients 418 years of age and older. Monday - Tuesday (8am-5pm) Most Wednesdays (8:30-5pm) $30 per visit, cash only  [facility]  [address], [facility] [phone] Patients are seen by appointment only. Walk-ins are not accepted. [facility] will see patients 24 years of age and older. One Wednesday Evening (Monthly: Volunteer Based).  $30 per visit, cash only  [employer] [facility]  [phone]) [phone] for adults; Children under age 834, call Graduate Pediatric Dentistry at [phone]. Children aged 274-14, please call [phone]) [phone] to request a pediatric application.  Dental services are provided in all areas of dental care including fillings, crowns and bridges, complete and partial dentures, implants, gum treatment, root canals, and extractions. Preventive care is also provided. Treatment is provided to both adults and children. Patients are selected via a lottery and there is often a waiting list.   [facility] [address], [place]  [phone] [email or web address]   [facility] [address], [place], [place] [phone], Ext. [phone] Second and Fourth Thursday of each month, opens at 6:30 AM; Clinic ends at 9 AM.  Patients are seen on a first-come first-served basis, and a limited number are seen during each clinic.   [facility]  [address], [place], [place] [phone])  [phone]   Eligibility Requirements You must have lived in [place], [place], or [place] counties for at least the last three months.   You cannot be eligible for state or federal sponsored healthcare  insurance, including [employer], [place], or [employer].   You generally cannot be eligible for healthcare insurance through your employer.    How to apply: Eligibility screenings are held every Tuesday and Wednesday afternoon from 1:00 pm until 4:00 pm. You do not need an appointment for the interview!  [facility] [address] [place], [place] [phone]   [facility] Health Department  [phone]   [facility] Health Department  [phone]   [facility]  [phone]    Behavioral Health Resources in the Community: Intensive Outpatient Programs Organization         Address  Phone  Notes  [facility] Services [address], [place], [place] [phone]   [facility] [address], [place], [place] [phone]   [facility] [address], [place], [place]  [phone]   [facility] Mental Health [address]  [place], [place] [phone] or [phone]   Substance Abuse Resources Organization         Address  Phone  Notes  [facility]  [phone]   [facility]  [phone]   [facility] [place]  [phone]   Floydene Flock  [phone]   Residential & Outpatient Substance Abuse Program  [phone]   Psychological Services Organization         Address  Phone  Notes  [facility] Behavioral Health  [phone]   [facility] Services  [phone]   [facility] Mental Health [address] [place] [phone] or [phone]    Mobile Crisis Teams Organization         Address  Phone  Notes  [facility], Mobile Crisis Care Unit  [phone]   [facility]  [address] [place], [place] [phone]   Doristine Locks [address] [place] [phone]    Self-Help/Support Groups Organization         Address  Phone             Notes  [facility] [place] - variety of support groups  [phone]- I7437963 Call for more information  Narcotics Anonymous (NA), [facility] [address], [employer] [place]  2 meetings at this location   Statistician         Address  Phone  Notes  [facility] [address],    [place]  [phone]   [facility]  [address] [place] [address], [place], [place] [phone]   [facility] [address] [place], [place] [phone] Admissions: 8am-3pm M-F  [facility] [address],    [place], [place] [phone]   [facility] [address] [place], [place], [place] [phone]   [facility] [address],  [place], [place] [phone]   [facility] - Intensive Outpatient [address] Laurell Josephs [address], [place], [place] [phone]   [facility] ([facility] [address]  [place], [place] [phone] or [phone]   [facility] ([facility]) [address], [place], [place] [phone] Accepts Medicaid  [facility] [address],  [place] [phone] Substance Abuse/Addiction Treatment   [facility] Organization         Address  Phone  Notes  [facility]  [phone]   Angie Fava, PhD [address] [place], [place]   [phone] or [phone])  [phone]   [facility]   [address] [place], [place] [phone]   [facility] Recovery [address] [place], [place] [phone] Insurance/Medicaid/sponsorship through [facility] and Families [address]                                    [place], [place] [phone]  Therapy/tele-psych/case  [facility] [address]   [place], [place] [phone]    Dr. Lolly Mustache  [phone]   [facility]  [employer] [facility] Dept. 1) [address] [place] 2) [address], [place] 3)  [address], [place] [phone]  [phone]   [facility] Child Abuse Hotline 5038032972 or [phone] (After Hours)

## 2015-01-27 NOTE — ED Provider Notes (Signed)
CSN: [id number]     Arrival date & time 01/27/15  0308 History   First MD Initiated Contact with Patient 01/27/15 0310     Chief Complaint  Patient presents with  . Drug Problem     (Consider location/radiation/quality/duration/timing/severity/associated sxs/prior Treatment) Patient is a 24 y.o. male presenting with chest pain.  Chest Pain Pain location:  Substernal area Pain quality: sharp   Pain radiates to:  Does not radiate Pain radiates to the back: no   Pain severity:  Moderate Onset quality:  Gradual Duration:  5 hours Timing:  Constant Progression:  Unchanged Chronicity:  New Context comment:  After using crystal meth Relieved by:  Nothing Worsened by:  Nothing tried Associated symptoms: no abdominal pain, no nausea, no shortness of breath and not vomiting     Past Medical History  Diagnosis Date  . Dental abscess    History reviewed. No pertinent past surgical history. History reviewed. No pertinent family history. History  Substance Use Topics  . Smoking status: Current Every Day Smoker -- 1.00 packs/day    Types: Cigarettes  . Smokeless tobacco: Never Used  . Alcohol Use: Yes     Comment: Drinks 3-4 times a week. Last drink: Last night    Review of Systems  Respiratory: Negative for shortness of breath.   Cardiovascular: Positive for chest pain.  Gastrointestinal: Negative for nausea, vomiting and abdominal pain.  All other systems reviewed and are negative.     Allergies  Codeine  Home Medications   Prior to Admission medications   Medication Sig Start Date End Date Taking? Authorizing Provider  aspirin 325 MG tablet Take 650 mg by mouth every 6 (six) hours as needed for headache.    Historical Provider, MD  bacitracin ointment Apply 1 application topically 2 (two) times daily. Patient not taking: Reported on 01/27/2015 01/17/15   Antony Madura, PA-C  hydrocortisone 2.5 % lotion Apply topically 2 (two) times daily. Patient not taking: Reported on  01/17/2015 05/14/14   Heather Laisure, PA-C   BP 139/76 mmHg  Pulse 100  Temp(Src) 98.8 F (37.1 C) (Oral)  Resp 18  Ht  (1.626 m)  Wt 135 lb (61.236 kg)  BMI 23.16 kg/m2  SpO2 100% Physical Exam  Constitutional: He is oriented to person, place, and time. He appears well-developed and well-nourished.  HENT:  Head: Normocephalic and atraumatic.  Eyes: Conjunctivae and EOM are normal.  Neck: Normal range of motion. Neck supple.  Cardiovascular: Normal rate, regular rhythm and normal heart sounds.   Pulmonary/Chest: Effort normal and breath sounds normal. No respiratory distress.  Abdominal: He exhibits no distension. There is no tenderness. There is no rebound and no guarding.  Musculoskeletal: Normal range of motion.  Neurological: He is alert and oriented to person, place, and time.  Skin: Skin is warm and dry.  Vitals reviewed.   ED Course  Procedures (including critical care time) Labs Review Labs Reviewed  BASIC METABOLIC PANEL - Abnormal; Notable for the following:    Sodium 134 (*)    Glucose, Bld 110 (*)    Anion gap 3 (*)    All other components within normal limits  URINE RAPID DRUG SCREEN (HOSP PERFORMED) - Abnormal; Notable for the following:    Amphetamines POSITIVE (*)    Tetrahydrocannabinol POSITIVE (*)    All other components within normal limits  CBC WITH DIFFERENTIAL/PLATELET  ETHANOL  Rosezena Sensor, ED    Imaging Review Dg Chest 2 View  01/27/2015   CLINICAL  DATA:  Acute chest pain.  EXAM: CHEST  2 VIEW  COMPARISON:  02/03/2013  FINDINGS: The cardiomediastinal silhouette is unremarkable.  There is no evidence of focal airspace disease, pulmonary edema, suspicious pulmonary nodule/mass, pleural effusion, or pneumothorax. No acute bony abnormalities are identified.  IMPRESSION: No active cardiopulmonary disease.   Electronically Signed   By: Harmon PierJeffrey  Hu M.D.   On: 01/27/2015 04:36     EKG Interpretation   Date/Time:  Tuesday January 27 2015  03:29:52 EDT Ventricular Rate:  120 PR Interval:  163 QRS Duration: 81 QT Interval:  322 QTC Calculation: 455 R Axis:   71 Text Interpretation:  Sinus tachycardia Borderline T wave abnormalities  SINCE LAST TRACING HEART RATE HAS INCREASED Confirmed by Mirian MoGentry, Matthew  [phone] on 01/27/2015 4:42:46 AM      MDM   Final diagnoses:  Chest pain, unspecified chest pain type  Methamphetamine abuse    24 y.o. male without pertinent PMH presents with chest pain after using meth.  On arrival pt with vitals and physical exam as above.  Pt intoxicated.    Wu unremarkable.  Pt sobered, denies HI/SI, hallucinations.  No further pain.  HR normalized.  Given resources to quit meth and mj.  No etoh abuse.  He agrees with plan to fu as outpt.    I have reviewed all laboratory and imaging studies if ordered as above  1. Chest pain, unspecified chest pain type   2. Methamphetamine abuse         Mirian MoMatthew Gentry, MD 01/27/15 [phone]

## 2015-01-27 NOTE — ED Notes (Signed)
Pt denies SI and HI

## 2015-01-27 NOTE — ED Notes (Signed)
Patient transported to X-ray 

## 2015-01-27 NOTE — ED Notes (Signed)
Pt states he took one shot of vodka, smoke some Marijuana, and took crystal meth

## 2017-01-31 ENCOUNTER — Encounter (HOSPITAL_COMMUNITY): Payer: Self-pay | Admitting: Emergency Medicine

## 2017-01-31 ENCOUNTER — Emergency Department (HOSPITAL_COMMUNITY)
Admission: EM | Admit: 2017-01-31 | Discharge: 2017-01-31 | Disposition: A | Discharge status: Home / Self Care | Payer: Self-pay | Attending: Emergency Medicine | Admitting: Emergency Medicine

## 2017-01-31 DIAGNOSIS — F1910 Other psychoactive substance abuse, uncomplicated: Secondary | ICD-10-CM | POA: Insufficient documentation

## 2017-01-31 DIAGNOSIS — F17210 Nicotine dependence, cigarettes, uncomplicated: Secondary | ICD-10-CM | POA: Insufficient documentation

## 2017-01-31 DIAGNOSIS — Z7982 Long term (current) use of aspirin: Secondary | ICD-10-CM | POA: Insufficient documentation

## 2017-01-31 DIAGNOSIS — Z79899 Other long term (current) drug therapy: Secondary | ICD-10-CM | POA: Insufficient documentation

## 2017-01-31 LAB — COMPREHENSIVE METABOLIC PANEL
ALBUMIN: 4.4 g/dL (ref 3.5–5.0)
ALT: 25 U/L (ref 17–63)
AST: 28 U/L (ref 15–41)
Alkaline Phosphatase: 85 U/L (ref 38–126)
Anion gap: 10 (ref 5–15)
BILIRUBIN TOTAL: 0.7 mg/dL (ref 0.3–1.2)
BUN: 6 mg/dL (ref 6–20)
CHLORIDE: 102 mmol/L (ref 101–111)
CO2: 29 mmol/L (ref 22–32)
Calcium: 9.5 mg/dL (ref 8.9–10.3)
Creatinine, Ser: 0.74 mg/dL (ref 0.61–1.24)
GFR calc Af Amer: >60 mL/min (ref >60)
GFR calc non Af Amer: >60 mL/min (ref >60)
GLUCOSE: 101 mg/dL — AB (ref 65–99)
POTASSIUM: 3.3 mmol/L — AB (ref 3.5–5.1)
Sodium: 141 mmol/L (ref 135–145)
Total Protein: 7.2 g/dL (ref 6.5–8.1)

## 2017-01-31 LAB — ETHANOL: Alcohol, Ethyl (B): <5 mg/dL (ref <5)

## 2017-01-31 LAB — CBC WITH DIFFERENTIAL/PLATELET
Basophils Absolute: 0.1 10*3/uL (ref 0.0–0.1)
Basophils Relative: 1 %
Eosinophils Absolute: 0.1 10*3/uL (ref 0.0–0.7)
Eosinophils Relative: 1 %
HCT: 44.2 % (ref 39.0–52.0)
HEMOGLOBIN: 15.6 g/dL (ref 13.0–17.0)
Lymphocytes Relative: 19 %
Lymphs Abs: 2.3 10*3/uL (ref 0.7–4.0)
MCH: 31.2 pg (ref 26.0–34.0)
MCHC: 35.3 g/dL (ref 30.0–36.0)
MCV: 88.4 fL (ref 78.0–100.0)
MONO ABS: 0.7 10*3/uL (ref 0.1–1.0)
MONOS PCT: 5 %
NEUTROS ABS: 8.9 10*3/uL — AB (ref 1.7–7.7)
Neutrophils Relative %: 74 %
Platelets: 264 10*3/uL (ref 150–400)
RBC: 5 MIL/uL (ref 4.22–5.81)
RDW: 12.9 % (ref 11.5–15.5)
WBC: 12.1 10*3/uL — ABNORMAL HIGH (ref 4.0–10.5)

## 2017-01-31 LAB — RAPID URINE DRUG SCREEN, HOSP PERFORMED
AMPHETAMINES: POSITIVE — AB
BARBITURATES: NOT DETECTED
Benzodiazepines: NOT DETECTED
Cocaine: NOT DETECTED
OPIATES: NOT DETECTED
TETRAHYDROCANNABINOL: POSITIVE — AB

## 2017-01-31 LAB — URINALYSIS, ROUTINE W REFLEX MICROSCOPIC
BILIRUBIN URINE: NEGATIVE
Glucose, UA: NEGATIVE mg/dL
HGB URINE DIPSTICK: NEGATIVE
Ketones, ur: NEGATIVE mg/dL
Leukocytes, UA: NEGATIVE
Nitrite: NEGATIVE
PH: 7 (ref 5.0–8.0)
Protein, ur: NEGATIVE mg/dL
SPECIFIC GRAVITY, URINE: 1.002 — AB (ref 1.005–1.030)

## 2017-01-31 LAB — LIPASE, BLOOD: Lipase: 25 U/L (ref 11–51)

## 2017-01-31 MED ORDER — ONDANSETRON HCL 4 MG/2ML IJ SOLN
4.0000 mg | Freq: Once | INTRAMUSCULAR | Status: AC
  Administered 2017-01-31: 4 mg via INTRAVENOUS
  Filled 2017-01-31: qty 2

## 2017-01-31 MED ORDER — SODIUM CHLORIDE 0.9 % IV BOLUS (SEPSIS)
1000.0000 mL | Freq: Once | INTRAVENOUS | Status: AC
  Administered 2017-01-31: 1000 mL via INTRAVENOUS

## 2017-01-31 NOTE — ED Triage Notes (Signed)
Pt brought in by EMS after pt has been using meth for several years but states he has been on a binge for the past week and a half  Pt states he has been drinking and smoking marijuana as well  Pt states he has headache, abd pain, nausea, and vomiting  Last used meth last night around 11pm and last drank about 4pm yesterday  Pt states he needs treatment for his sxs and wants to look into detox

## 2017-01-31 NOTE — ED Notes (Signed)
Patient is alert and oriented x3.  He was given DC instructions and follow up visit instructions.  Patient gave verbal understanding.  He was DC ambulatory under his own power to home.  V/S stable.  He was not showing any signs of distress on DC 

## 2017-01-31 NOTE — Discharge Instructions (Signed)
Follow-up with drug and alcohol treatment centers. The resource guide for these facilities as been provided in this discharge summary for you to call and make these arrangements.

## 2017-01-31 NOTE — ED Provider Notes (Signed)
[facility]-EMERGENCY DEPT Provider Note   CSN: [id number] Arrival date & time: 01/31/17  0301     History   Chief Complaint Chief Complaint  Patient presents with  . Addiction Problem    HPI Mark Hammond is a 26 y.o. male.  Patient is a 26 year old with no significant past medical history. He presents for evaluation of headache, nausea, abdominal pain, body aches, chills, and feeling sweaty. This is been ongoing for the past several days. He has a history of methamphetamine abuse. He also reports using marijuana and alcohol. He believes he may be withdrawing and is requesting information regarding detox and treatment for substance abuse.   The history is provided by the patient.    Past Medical History:  Diagnosis Date  . Dental abscess     There are no active problems to display for this patient.   History reviewed. No pertinent surgical history.     Home Medications    Prior to Admission medications   Medication Sig Start Date End Date Taking? Authorizing Provider  aspirin 325 MG tablet Take 650 mg by mouth every 6 (six) hours as needed for headache.    Historical Provider, MD  bacitracin ointment Apply 1 application topically 2 (two) times daily. Patient not taking: Reported on 01/27/2015 01/17/15   Antony Madura, PA-C  hydrocortisone 2.5 % lotion Apply topically 2 (two) times daily. Patient not taking: Reported on 01/17/2015 05/14/14   Santiago Glad, PA-C    Family History Family History  Problem Relation Age of Onset  . Stroke Other     Social History Social History  Substance Use Topics  . Smoking status: Current Every Day Smoker    Packs/day: 1.00    Types: Cigarettes  . Smokeless tobacco: Never Used  . Alcohol use Yes     Comment: Drinks 3-4 times a week. Last drink: Last night     Allergies   Codeine   Review of Systems Review of Systems  All other systems reviewed and are negative.    Physical Exam Updated Vital Signs BP (!) 143/91 (BP  Location: Right Arm)   Pulse (!) 117   Temp 97.8 F (36.6 C) (Oral)   Resp 18   Ht  (1.651 m)   Wt 125 lb (56.7 kg)   SpO2 100%   BMI 20.80 kg/m   Physical Exam  Constitutional: He is oriented to person, place, and time. He appears well-developed and well-nourished. No distress.  HENT:  Head: Normocephalic and atraumatic.  Mouth/Throat: Oropharynx is clear and moist.  Eyes: EOM are normal. Pupils are equal, round, and reactive to light.  Neck: Normal range of motion. Neck supple.  Cardiovascular: Normal rate and regular rhythm.  Exam reveals no friction rub.   No murmur heard. Pulmonary/Chest: Effort normal and breath sounds normal. No respiratory distress. He has no wheezes. He has no rales.  Abdominal: Soft. Bowel sounds are normal. He exhibits no distension. There is no tenderness.  Musculoskeletal: Normal range of motion. He exhibits no edema.  Neurological: He is alert and oriented to person, place, and time. No cranial nerve deficit. He exhibits normal muscle tone. Coordination normal.  Skin: Skin is warm and dry. He is not diaphoretic.  Nursing note and vitals reviewed.    ED Treatments / Results  Labs (all labs ordered are listed, but only abnormal results are displayed) Labs Reviewed  COMPREHENSIVE METABOLIC PANEL  LIPASE, BLOOD  CBC WITH DIFFERENTIAL/PLATELET  ETHANOL  URINALYSIS, ROUTINE W REFLEX MICROSCOPIC  RAPID URINE DRUG SCREEN, HOSP PERFORMED    EKG  EKG Interpretation None       Radiology No results found.  Procedures Procedures (including critical care time)  Medications Ordered in ED Medications  sodium chloride 0.9 % bolus 1,000 mL (not administered)  ondansetron (ZOFRAN) injection 4 mg (not administered)     Initial Impression / Assessment and Plan / ED Course  I have reviewed the triage vital signs and the nursing notes.  Pertinent labs & imaging results that were available during my care of the patient were reviewed by me  and considered in my medical decision making (see chart for details).  Patient presents here with multiple complaints that he attributes to possible drug withdrawal. He admits to daily use of amphetamines and marijuana, as well as alcohol.  Workup was performed to rule out a medical cause of these symptoms. His laboratory studies are all essentially unremarkable and urinalysis is here. He does have a positive drug screen for amphetamines and marijuana.  I see no indication for medical admission and believe he is medically cleared for outpatient drug treatment. He will be given the resource guide for substance abuse.  Final Clinical Impressions(s) / ED Diagnoses   Final diagnoses:  None    New Prescriptions New Prescriptions   No medications on file     Geoffery Lyons, MD 01/31/17 [phone]

## 2017-03-14 ENCOUNTER — Emergency Department (HOSPITAL_COMMUNITY)
Admission: EM | Admit: 2017-03-14 | Discharge: 2017-03-14 | Disposition: A | Discharge status: Home / Self Care | Payer: Self-pay | Attending: Emergency Medicine | Admitting: Emergency Medicine

## 2017-03-14 ENCOUNTER — Encounter (HOSPITAL_COMMUNITY): Payer: Self-pay

## 2017-03-14 DIAGNOSIS — K0889 Other specified disorders of teeth and supporting structures: Secondary | ICD-10-CM | POA: Insufficient documentation

## 2017-03-14 DIAGNOSIS — F17210 Nicotine dependence, cigarettes, uncomplicated: Secondary | ICD-10-CM | POA: Insufficient documentation

## 2017-03-14 DIAGNOSIS — Z79899 Other long term (current) drug therapy: Secondary | ICD-10-CM | POA: Insufficient documentation

## 2017-03-14 MED ORDER — PENICILLIN V POTASSIUM 500 MG PO TABS: 500.0000 mg | ORAL_TABLET | Freq: Two times a day (BID) | ORAL | 0 refills | Status: DC

## 2017-03-14 MED ORDER — TRAMADOL HCL 50 MG PO TABS: 50.0000 mg | ORAL_TABLET | Freq: Four times a day (QID) | ORAL | 0 refills | Status: DC | PRN

## 2017-03-14 NOTE — ED Provider Notes (Signed)
[facility]-EMERGENCY DEPT Provider Note   CSN: [id number] Arrival date & time: 03/14/17  1840  By signing my name below, I, Diona Browner, attest that this documentation has been prepared under the direction and in the presence of Melburn Hake, PA-C. Electronically Signed: Diona Browner, ED Scribe. 03/14/17. 7:11 PM.  History   Chief Complaint Chief Complaint  Patient presents with  . Dental Pain    HPI Mark Hammond is a 26 y.o. male who presents to the Emergency Department complaining of intermittent diffuse left sided dental pain for the last couple of weeks. Pt reports having dental issues for the last couple of years. Associated sx include swelling. Can open mouth without difficulty. He has taken aleve and ibuprofen without relief. Pt denies fever, neck or facial swelling, vomiting, and drainage.   The history is provided by the patient. No language interpreter was used.    Past Medical History:  Diagnosis Date  . Dental abscess     There are no active problems to display for this patient.   History reviewed. No pertinent surgical history.     Home Medications    Prior to Admission medications   Medication Sig Start Date End Date Taking? Authorizing Provider  bacitracin ointment Apply 1 application topically 2 (two) times daily. Patient not taking: Reported on 01/27/2015 01/17/15   Antony Madura, PA-C  hydrocortisone 2.5 % lotion Apply topically 2 (two) times daily. Patient not taking: Reported on 01/17/2015 05/14/14   Santiago Glad, PA-C  penicillin v potassium (VEETID) 500 MG tablet Take 1 tablet (500 mg total) by mouth 2 (two) times daily. 03/14/17   Barrett Henle, PA-C  traMADol (ULTRAM) 50 MG tablet Take 1 tablet (50 mg total) by mouth every 6 (six) hours as needed. 03/14/17   Barrett Henle, PA-C    Family History Family History  Problem Relation Age of Onset  . Stroke Other     Social History Social History  Substance Use Topics  .  Smoking status: Current Every Day Smoker    Packs/day: 1.00    Types: Cigarettes  . Smokeless tobacco: Never Used  . Alcohol use Yes     Comment: Drinks 3-4 times a week. Last drink: Last night     Allergies   Codeine   Review of Systems Review of Systems  Constitutional: Negative for fever.  HENT: Positive for dental problem. Negative for facial swelling.        +Mouth swelling.  Gastrointestinal: Negative for vomiting.     Physical Exam Updated Vital Signs BP (!) 145/95 (BP Location: Right Arm)   Pulse 98   Temp 98.7 F (37.1 C) (Oral)   Resp 19   Ht 5\' 5"  (1.651 m)   Wt 58.4 kg (128 lb 12.8 oz)   SpO2 100%   BMI 21.43 kg/m   Physical Exam  Constitutional: He is oriented to person, place, and time. He appears well-developed and well-nourished.  HENT:  Head: Normocephalic and atraumatic.  Mouth/Throat: Uvula is midline, oropharynx is clear and moist and mucous membranes are normal. No oropharyngeal exudate, posterior oropharyngeal edema, posterior oropharyngeal erythema or tonsillar abscesses. No tonsillar exudate.    Very poor dentition throughout with multiple decaying teeth with large cavities. No facial or neck swelling. No trismus or drooling. Pt tolerates secretion.   Eyes: Conjunctivae and EOM are normal. Right eye exhibits no discharge. Left eye exhibits no discharge. No scleral icterus.  Neck: Normal range of motion. Neck supple.  Cardiovascular: Normal rate, regular  rhythm, normal heart sounds and intact distal pulses.   Pulmonary/Chest: Effort normal.  Neurological: He is alert and oriented to person, place, and time.  Skin: Skin is warm and dry.  Nursing note and vitals reviewed.    ED Treatments / Results  DIAGNOSTIC STUDIES: Oxygen Saturation is 100% on RA, normal by my interpretation.   COORDINATION OF CARE: 7:11 PM-Discussed next steps with pt. Pt verbalized understanding and is agreeable with the plan.   Labs (all labs ordered are  listed, but only abnormal results are displayed) Labs Reviewed - No data to display  EKG  EKG Interpretation None       Radiology No results found.  Procedures Procedures (including critical care time)  Medications Ordered in ED Medications - No data to display   Initial Impression / Assessment and Plan / ED Course  I have reviewed the triage vital signs and the nursing notes.  Pertinent labs & imaging results that were available during my care of the patient were reviewed by me and considered in my medical decision making (see chart for details).     Patient with toothache.  No gross abscess.  Exam unconcerning for Ludwig's angina or spread of infection.  Will treat with penicillin and pain medicine.  Urged patient to follow-up with dentist.     Final Clinical Impressions(s) / ED Diagnoses   Final diagnoses:  Pain, dental    New Prescriptions New Prescriptions   PENICILLIN V POTASSIUM (VEETID) 500 MG TABLET    Take 1 tablet (500 mg total) by mouth 2 (two) times daily.   TRAMADOL (ULTRAM) 50 MG TABLET    Take 1 tablet (50 mg total) by mouth every 6 (six) hours as needed.   I personally performed the services described in this documentation, which was scribed in my presence. The recorded information has been reviewed and is accurate.     Barrett Henleadeau, Shereka Lafortune Elizabeth, PA-C 03/14/17 1929    Mancel BaleWentz, Elliott, MD 03/14/17 [phone]

## 2017-03-14 NOTE — Discharge Instructions (Signed)
Take medications as prescribed. You may also take 600 mg ibuprofen 4 times daily as needed for pain relief. I recommend eating prior to taking ibuprofen to prevent gastrointestinal side effects. You may apply ice to affected area for 15-20 minutes 3-4 times daily to help with pain. °Follow-up with one of the dental clinics listed below for further management of your dental pain. °Return to the emergency department if symptoms worsen or new onset of fever, headache, neck stiffness, facial/neck swelling, unable to open jaw, unable to swallow resulting in drooling, difficulty breathing, drainage, unable to tolerate fluids.  ° °[employer] °[employer] °[facility] °[address] °[place], [place] [address] °Phone [phone] ° °[facility] [employer] [facility] [place], [place], exemplifies the [employer] School?s vision to improve the health and quality of life of all [place] by creating leaders with a passion to care for the underserved and by leading the nation in community-based, service learning oral health education. ° °We are committed to offering comprehensive general dental services for adults, children and special needs patients in a safe, caring and professional setting. ° ° °Appointments: Our clinic is open Monday through Friday 8:00 a.m. until 5:00 p.m. The amount of time scheduled for an appointment depends on the patient?s specific needs. We ask that you keep your appointed time for care or provide 24-hour notice of all appointment changes. Parents or legal guardians must accompany minor children. °  °Payment for Services: Medicaid and other insurance plans are welcome. Payment for services is due when services are rendered and may be made by cash or credit card. If you have dental insurance, we will assist you with your claim submission. °   °Emergencies:   Emergency services will be provided Monday through Friday on a walk-in basis.  Please arrive early for emergency services. After hours emergency services will be provided for patients of record as required. °  °Services:  °Comprehensive General Dentistry °Children?s Dentistry °Oral Surgery - Extractions °Root Canals °Sealants and Tooth Colored Fillings °Crowns and Bridges °Dentures and Partial Dentures °Implant Services °Periodontal Services and Cleanings °Cosmetic Tooth Whitening °Digital Radiography °3-D/[facility] Imaging  °

## 2017-03-14 NOTE — ED Triage Notes (Signed)
Patient presents with intermittent, diffuse dental pain, starting "years ago." Patient reports the pain "moves from the right side of my mouth to the left side of my mouth." Patient reports pain primarily to bilateral lower molars. Patient reports approx 30 min ago, taking an "aleve and crushing it up and putting it on my back left molar to stop it from hurting." Patient denies seeing a dentist.  Patient denies fevers. Patient reports being able to chew and swallow food without difficulty.

## 2017-04-26 ENCOUNTER — Emergency Department (HOSPITAL_COMMUNITY): Payer: Self-pay

## 2017-04-26 ENCOUNTER — Encounter (HOSPITAL_COMMUNITY): Payer: Self-pay

## 2017-04-26 ENCOUNTER — Emergency Department (HOSPITAL_COMMUNITY)
Admission: EM | Admit: 2017-04-26 | Discharge: 2017-04-26 | Disposition: A | Discharge status: Home / Self Care | Payer: Self-pay | Attending: Emergency Medicine | Admitting: Emergency Medicine

## 2017-04-26 DIAGNOSIS — N50811 Right testicular pain: Secondary | ICD-10-CM

## 2017-04-26 DIAGNOSIS — F17210 Nicotine dependence, cigarettes, uncomplicated: Secondary | ICD-10-CM | POA: Insufficient documentation

## 2017-04-26 DIAGNOSIS — Z79899 Other long term (current) drug therapy: Secondary | ICD-10-CM | POA: Insufficient documentation

## 2017-04-26 LAB — URINALYSIS, ROUTINE W REFLEX MICROSCOPIC
BACTERIA UA: NONE SEEN
Bilirubin Urine: NEGATIVE
Glucose, UA: NEGATIVE mg/dL
HGB URINE DIPSTICK: NEGATIVE
Ketones, ur: NEGATIVE mg/dL
LEUKOCYTES UA: NEGATIVE
NITRITE: NEGATIVE
PH: 7 (ref 5.0–8.0)
Protein, ur: NEGATIVE mg/dL
SPECIFIC GRAVITY, URINE: 1.003 — AB (ref 1.005–1.030)
SQUAMOUS EPITHELIAL / LPF: NONE SEEN
WBC, UA: NONE SEEN WBC/hpf (ref 0–5)

## 2017-04-26 MED ORDER — LIDOCAINE HCL 1 % IJ SOLN
INTRAMUSCULAR | Status: AC
  Administered 2017-04-26: 20 mL
  Filled 2017-04-26: qty 20

## 2017-04-26 MED ORDER — KETOROLAC TROMETHAMINE 30 MG/ML IJ SOLN
30.0000 mg | Freq: Once | INTRAMUSCULAR | Status: AC
  Administered 2017-04-26: 30 mg via INTRAMUSCULAR
  Filled 2017-04-26: qty 1

## 2017-04-26 MED ORDER — AZITHROMYCIN 250 MG PO TABS
1000.0000 mg | ORAL_TABLET | Freq: Once | ORAL | Status: AC
  Administered 2017-04-26: 1000 mg via ORAL
  Filled 2017-04-26: qty 4

## 2017-04-26 MED ORDER — CEFTRIAXONE SODIUM 250 MG IJ SOLR
250.0000 mg | Freq: Once | INTRAMUSCULAR | Status: AC
  Administered 2017-04-26: 250 mg via INTRAMUSCULAR
  Filled 2017-04-26: qty 250

## 2017-04-26 MED ORDER — IBUPROFEN 600 MG PO TABS: 600.0000 mg | ORAL_TABLET | Freq: Four times a day (QID) | ORAL | 0 refills | Status: DC | PRN

## 2017-04-26 NOTE — Discharge Instructions (Signed)
Please read and follow all provided instructions.  Your diagnoses today include:  1. Right testicular pain     Tests performed today include: Vital signs. See below for your results today.   Medications prescribed:  Take as prescribed   Home care instructions:  Follow any educational materials contained in this packet.  Follow-up instructions: Please follow-up with Urology for further evaluation of symptoms and treatment   Return instructions:  Please return to the Emergency Department if you do not get better, if you get worse, or new symptoms OR  - Fever (temperature greater than 101.60F)  - Bleeding that does not stop with holding pressure to the area    -Severe pain (please note that you may be more sore the day after your accident)  - Chest Pain  - Difficulty breathing  - Severe nausea or vomiting  - Inability to tolerate food and liquids  - Passing out  - Skin becoming red around your wounds  - Change in mental status (confusion or lethargy)  - New numbness or weakness    Please return if you have any other emergent concerns.  Additional Information:  Your vital signs today were: BP 119/78 (BP Location: Left Arm)    Pulse 83    Temp 97.6 F (36.4 C) (Oral)    Resp 18    SpO2 99%  If your blood pressure (BP) was elevated above 135/85 this visit, please have this repeated by your doctor within one month. ---------------

## 2017-04-26 NOTE — ED Triage Notes (Signed)
Pt complains of an acute sharp pain through his right testicle last night about 2300 He states that now it's a constant ache When the pain first happened he had urinary hesitation Pt denies any discharge

## 2017-04-26 NOTE — ED Notes (Signed)
Bed: WTR5 Expected date:  Expected time:  Means of arrival:  Comments: 

## 2017-04-26 NOTE — ED Provider Notes (Signed)
[facility]-EMERGENCY DEPT Provider Note   CSN: [id number] Arrival date & time: 04/26/17  0405     History   Chief Complaint Chief Complaint  Patient presents with  . Testicle Pain    HPI Mark Hammond is a 26 y.o. male.  HPI  26 y.o. male, presents to the Emergency Department today due to sudden onset right testicular pain around 2300. States pain sharp sensation and rates 5/10 intermittently. Rates pain 2/10 currently. No swelling. Worse with sitting. Improved with standing. No penile discharge. No dysuria. Pt sexually active with one partner. No fevers. No abdominal pain. No other symptoms noted. .   Past Medical History:  Diagnosis Date  . Dental abscess     There are no active problems to display for this patient.   History reviewed. No pertinent surgical history.     Home Medications    Prior to Admission medications   Medication Sig Start Date End Date Taking? Authorizing Provider  bacitracin ointment Apply 1 application topically 2 (two) times daily. Patient not taking: Reported on 01/27/2015 01/17/15   Antony MaduraHumes, Kelly, PA-C  hydrocortisone 2.5 % lotion Apply topically 2 (two) times daily. Patient not taking: Reported on 01/17/2015 05/14/14   Santiago GladLaisure, Heather, PA-C  penicillin v potassium (VEETID) 500 MG tablet Take 1 tablet (500 mg total) by mouth 2 (two) times daily. 03/14/17   Barrett HenleNadeau, Nicole Elizabeth, PA-C  traMADol (ULTRAM) 50 MG tablet Take 1 tablet (50 mg total) by mouth every 6 (six) hours as needed. 03/14/17   Barrett HenleNadeau, Nicole Elizabeth, PA-C    Family History Family History  Problem Relation Age of Onset  . Stroke Other     Social History Social History  Substance Use Topics  . Smoking status: Current Every Day Smoker    Packs/day: 1.00    Types: Cigarettes  . Smokeless tobacco: Never Used  . Alcohol use Yes     Comment: Drinks 3-4 times a week. Last drink: Last night     Allergies   Codeine   Review of Systems Review of Systems ROS reviewed and all  are negative for acute change except as noted in the HPI.  Physical Exam Updated Vital Signs BP 119/78 (BP Location: Left Arm)   Pulse 83   Temp 97.6 F (36.4 C) (Oral)   Resp 18   SpO2 99%   Physical Exam  Constitutional: He is oriented to person, place, and time. Vital signs are normal. He appears well-developed and well-nourished.  NAD  HENT:  Head: Normocephalic and atraumatic.  Right Ear: Hearing normal.  Left Ear: Hearing normal.  Eyes: Pupils are equal, round, and reactive to light. Conjunctivae and EOM are normal.  Neck: Normal range of motion. Neck supple.  Cardiovascular: Normal rate, regular rhythm, normal heart sounds and intact distal pulses.   Pulmonary/Chest: Effort normal.  Abdominal: Soft. Bowel sounds are normal. There is no tenderness. There is no rigidity, no rebound, no guarding, no CVA tenderness, no tenderness at McBurney's point and negative Murphy's sign. Hernia confirmed negative in the right inguinal area and confirmed negative in the left inguinal area.  Genitourinary: Cremasteric reflex is present. Right testis shows tenderness. Right testis shows no swelling. Left testis shows no swelling and no tenderness. No penile erythema or penile tenderness. No discharge found.  Genitourinary Comments: Chaperone present. Right testicle without obvious swelling. Cremasteric reflex present. TTP below right testicle along epididymis. No palpable hernia.     Musculoskeletal: Normal range of motion.  Neurological: He is alert and  oriented to person, place, and time.  Skin: Skin is warm and dry.  Psychiatric: He has a normal mood and affect. His speech is normal and behavior is normal. Thought content normal.  Nursing note and vitals reviewed.  ED Treatments / Results  Labs (all labs ordered are listed, but only abnormal results are displayed) Labs Reviewed  URINALYSIS, ROUTINE W REFLEX MICROSCOPIC - Abnormal; Notable for the following:       Result Value   Color,  Urine STRAW (*)    Specific Gravity, Urine 1.003 (*)    All other components within normal limits  GC/CHLAMYDIA PROBE AMP ([place]) NOT [facility]   EKG  EKG Interpretation None      Radiology No results found.  Procedures Procedures (including critical care time)  Medications Ordered in ED Medications  cefTRIAXone (ROCEPHIN) injection 250 mg (not administered)  azithromycin (ZITHROMAX) tablet 1,000 mg (not administered)  ketorolac (TORADOL) 30 MG/ML injection 30 mg (30 mg Intramuscular Given 04/26/17 0549)    Initial Impression / Assessment and Plan / ED Course  I have reviewed the triage vital signs and the nursing notes.  Pertinent labs & imaging results that were available during my care of the patient were reviewed by me and considered in my medical decision making (see chart for details).  Final Clinical Impressions(s) / ED Diagnoses  {I have reviewed and evaluated the relevant laboratory values. {I have reviewed and evaluated the relevant imaging studies.  {I have reviewed the relevant previous healthcare records.  {I obtained HPI from historian.   ED Course:  Assessment: Pt is a 26 y.o. male presents to the Emergency Department today due to sudden onset right testicular pain around 2300. States pain sharp sensation and rates 5/10 intermittently. Rates pain 2/10 currently. No swelling. Worse with sitting. Improved with standing. No penile discharge. No dysuria. Pt sexually active with one partner. No fevers. No abdominal pain.  On exam, pt in NAD. Nontoxic/nonseptic appearing. VSS. Afebrile. Lungs CTA. Heart RRR. Abdomen nontender soft. GU exam Right testicle without obvious swelling. Cremasteric reflex present. TTP below right testicle along epididymis. No palpable hernia. Concern for torsion, but less likely. US unremarkable. No torsion. No epidydymitis. UA unremarkable. GC obtained. Given analgesa in ED. Plan is to DC home with follow up to Urology. Given Rocephin and  Azithro in ED for STI prophylaxis. Discussed with supervising physician who agrees. Counseled to avoid sexual contact x 1 week. Will DC home with Doxy. At time of discharge, Patient is in no acute distress. Vital Signs are stable. Patient is able to ambulate. Patient able to tolerate PO.   Disposition/Plan:  DC Home Additional Verbal discharge instructions given and discussed with patient.  Pt Instructed to f/u with Urology in the next week for evaluation and treatment of symptoms. Return precautions given Pt acknowledges and agrees with plan  Supervising Physician Nicanor AlconPalumbo, April, MD  Final diagnoses:  Right testicular pain    New Prescriptions New Prescriptions   No medications on file     Wilber BihariMohr, Laken Rog, PA-C 04/26/17 [id number]    Palumbo, April, MD 04/27/17 [id number]

## 2017-05-02 ENCOUNTER — Other Ambulatory Visit: Payer: Self-pay

## 2017-05-02 ENCOUNTER — Encounter (HOSPITAL_COMMUNITY): Payer: Self-pay | Admitting: Emergency Medicine

## 2017-05-02 ENCOUNTER — Emergency Department (HOSPITAL_COMMUNITY)
Admission: EM | Admit: 2017-05-02 | Discharge: 2017-05-02 | Disposition: A | Discharge status: Home / Self Care | Payer: Self-pay | Attending: Emergency Medicine | Admitting: Emergency Medicine

## 2017-05-02 ENCOUNTER — Emergency Department (HOSPITAL_COMMUNITY): Payer: Self-pay

## 2017-05-02 DIAGNOSIS — F17210 Nicotine dependence, cigarettes, uncomplicated: Secondary | ICD-10-CM | POA: Insufficient documentation

## 2017-05-02 DIAGNOSIS — R0789 Other chest pain: Secondary | ICD-10-CM | POA: Insufficient documentation

## 2017-05-02 LAB — BASIC METABOLIC PANEL
ANION GAP: 7 (ref 5–15)
BUN: 10 mg/dL (ref 6–20)
CALCIUM: 9.5 mg/dL (ref 8.9–10.3)
CHLORIDE: 103 mmol/L (ref 101–111)
CO2: 29 mmol/L (ref 22–32)
CREATININE: 0.84 mg/dL (ref 0.61–1.24)
Glucose, Bld: 105 mg/dL — ABNORMAL HIGH (ref 65–99)
Potassium: 3.6 mmol/L (ref 3.5–5.1)
Sodium: 139 mmol/L (ref 135–145)

## 2017-05-02 LAB — CBC
HEMATOCRIT: 44.9 % (ref 39.0–52.0)
Hemoglobin: 15.8 g/dL (ref 13.0–17.0)
MCH: 30.7 pg (ref 26.0–34.0)
MCHC: 35.2 g/dL (ref 30.0–36.0)
MCV: 87.4 fL (ref 78.0–100.0)
PLATELETS: 262 10*3/uL (ref 150–400)
RBC: 5.14 MIL/uL (ref 4.22–5.81)
RDW: 12.5 % (ref 11.5–15.5)
WBC: 11.9 10*3/uL — ABNORMAL HIGH (ref 4.0–10.5)

## 2017-05-02 LAB — I-STAT TROPONIN, ED: Troponin i, poc: 0 ng/mL (ref 0.00–0.08)

## 2017-05-02 NOTE — ED Notes (Signed)
Pt departed in NAD, refused use of wheelchair.  

## 2017-05-02 NOTE — ED Notes (Signed)
ED Provider at bedside. 

## 2017-05-02 NOTE — ED Triage Notes (Signed)
Pt reports that he is experiencing left sided chest pain.  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 Pt reports he has been up for over 24 hours.    Denies n/v/d/sob/weakenss and such.

## 2017-05-02 NOTE — Discharge Instructions (Signed)
You can take ibuprofen 400 mg 4 times a day as needed for pain. Recheck if you get pain that is constant and lasts more then 30 minutes at at time, or if you get short of breath. If you want help with substance abuse, look at the resource guide.

## 2017-05-02 NOTE — ED Provider Notes (Signed)
[facility]-EMERGENCY DEPT Provider Note   CSN: [id number] Arrival date & time: 05/02/17  0012  Time seen 05:25 AM   History   Chief Complaint Chief Complaint  Patient presents with  . Chest Pain    HPI Mark Hammond is a 26 y.o. male.  HPI  patient presents emergency department complaining of chest pain that started about 4 days ago. He points to the left lateral chest wall area as to the location. He states the pain is intermittent and only last a few seconds. He states sometimes he has shortness of breath with it. He denies cough, fever, or known fall or injury. He states he's not having pain currently however the last time was about 10 minutes ago. Please note the patient states he wants to sleep and not be bothered. He denies street drug use to me however he did report to triage that he has been using meth and polys and has been drinking. Patient was offered mental health evaluation for substance abuse however he refused. He states he doesn't need help.  PCP none  Past Medical History:  Diagnosis Date  . Dental abscess     There are no active problems to display for this patient.   History reviewed. No pertinent surgical history.     Home Medications    Prior to Admission medications   Medication Sig Start Date End Date Taking? Authorizing Provider  bacitracin ointment Apply 1 application topically 2 (two) times daily. Patient not taking: Reported on 01/27/2015 01/17/15   Antony Madura, PA-C  hydrocortisone 2.5 % lotion Apply topically 2 (two) times daily. Patient not taking: Reported on 01/17/2015 05/14/14   Santiago Glad, PA-C  ibuprofen (ADVIL,MOTRIN) 600 MG tablet Take 1 tablet (600 mg total) by mouth every 6 (six) hours as needed. Patient not taking: Reported on 05/02/2017 04/26/17   Audry Pili, PA-C  penicillin v potassium (VEETID) 500 MG tablet Take 1 tablet (500 mg total) by mouth 2 (two) times daily. Patient not taking: Reported on 04/26/2017 03/14/17   Barrett Henle, PA-C  traMADol (ULTRAM) 50 MG tablet Take 1 tablet (50 mg total) by mouth every 6 (six) hours as needed. Patient not taking: Reported on 04/26/2017 03/14/17   Barrett Henle, PA-C    Family History Family History  Problem Relation Age of Onset  . Stroke Other     Social History Social History  Substance Use Topics  . Smoking status: Current Every Day Smoker    Packs/day: 1.00    Types: Cigarettes  . Smokeless tobacco: Never Used  . Alcohol use Yes     Comment: Drinks 3-4 times a week. Last drink: Last night  + street drugs   Allergies   Codeine   Review of Systems Review of Systems  All other systems reviewed and are negative.    Physical Exam Updated Vital Signs BP 117/77   Pulse 61   Temp 98.6 F (37 C) (Oral)   Resp 18   Ht 5\' 4"  (1.626 m)   Wt 58.1 kg (128 lb)   SpO2 100%   BMI 21.97 kg/m   Vital signs normal    Physical Exam  Constitutional: He is oriented to person, place, and time. He appears well-developed and well-nourished.  Non-toxic appearance. He does not appear ill. No distress.  HENT:  Head: Normocephalic and atraumatic.  Right Ear: External ear normal.  Left Ear: External ear normal.  Nose: Nose normal. No mucosal edema or rhinorrhea.  Mouth/Throat: Oropharynx is  clear and moist and mucous membranes are normal. No dental abscesses or uvula swelling.  Eyes: Pupils are equal, round, and reactive to light. Conjunctivae and EOM are normal.  Neck: Normal range of motion and full passive range of motion without pain. Neck supple.  Cardiovascular: Normal rate, regular rhythm and normal heart sounds.  Exam reveals no gallop and no friction rub.   No murmur heard. Pulmonary/Chest: Effort normal and breath sounds normal. No respiratory distress. He has no wheezes. He has no rhonchi. He has no rales. He exhibits no tenderness and no crepitus.    Area of pain  Abdominal: Soft. Normal appearance and bowel sounds are normal. He  exhibits no distension. There is no tenderness. There is no rebound and no guarding.  Musculoskeletal: Normal range of motion. He exhibits no edema or tenderness.  Moves all extremities well.   Neurological: He is alert and oriented to person, place, and time. He has normal strength. No cranial nerve deficit.  Skin: Skin is warm, dry and intact. No rash noted. No erythema. No pallor.  Psychiatric: He has a normal mood and affect. His speech is normal and behavior is normal. His mood appears not anxious.  Nursing note and vitals reviewed.    ED Treatments / Results  Labs (all labs ordered are listed, but only abnormal results are displayed) Results for orders placed or performed during the hospital encounter of 05/02/17  Basic metabolic panel  Result Value Ref Range   Sodium 139 135 - 145 mmol/L   Potassium 3.6 3.5 - 5.1 mmol/L   Chloride 103 101 - 111 mmol/L   CO2 29 22 - 32 mmol/L   Glucose, Bld 105 (H) 65 - 99 mg/dL   BUN 10 6 - 20 mg/dL   Creatinine, Ser 1.610.84 0.61 - 1.24 mg/dL   Calcium 9.5 8.9 - 09.610.3 mg/dL   GFR calc non Af Amer >60 >60 mL/min   GFR calc Af Amer >60 >60 mL/min   Anion gap 7 5 - 15  CBC  Result Value Ref Range   WBC 11.9 (H) 4.0 - 10.5 K/uL   RBC 5.14 4.22 - 5.81 MIL/uL   Hemoglobin 15.8 13.0 - 17.0 g/dL   HCT 04.544.9 40.939.0 - 81.152.0 %   MCV 87.4 78.0 - 100.0 fL   MCH 30.7 26.0 - 34.0 pg   MCHC 35.2 30.0 - 36.0 g/dL   RDW 91.412.5 78.211.5 - 95.615.5 %   Platelets 262 150 - 400 K/uL  I-stat troponin, ED  Result Value Ref Range   Troponin i, poc 0.00 0.00 - 0.08 ng/mL   Comment 3            Laboratory interpretation all normal          EKG  EKG Interpretation  Date/Time:  Tuesday May 02 2017 00:11:10 EDT Ventricular Rate:  102 PR Interval:  132 QRS Duration: 86 QT Interval:  342 QTC Calculation: 445 R Axis:   84 Text Interpretation:  Sinus tachycardia Otherwise normal ECG No significant change since last tracing 27 Jan 2015 Confirmed by Devoria AlbeKnapp, Kaysee Hergert  ([id number]) on 05/02/2017 5:31:23 AM       Radiology Dg Chest 2 View  Result Date: 05/02/2017 CLINICAL DATA:  26 year old male with left-sided chest pain. EXAM: CHEST  2 VIEW COMPARISON:  Chest radiograph dated 01/27/2015 FINDINGS: The heart size and mediastinal contours are within normal limits. Both lungs are clear. The visualized skeletal structures are unremarkable. IMPRESSION: No active cardiopulmonary disease. Electronically Signed  By: Elgie CollardArash  Radparvar M.D.   On: 05/02/2017 00:47     [place] Scrotum  [place] Art/ven Flow Abd Pelv Doppler  Result Date: 04/26/2017 CLINICAL DATA:  Acute right testicle painIMPRESSION: Normal scrotal contents. No testicular mass or torsion. No abnormal scrotal fluid collections. Electronically Signed   By: Ellery Plunkaniel R Mitchell M.D.   On: 04/26/2017 06:40   Procedures Procedures (including critical care time)  Medications Ordered in ED Medications - No data to display   Initial Impression / Assessment and Plan / ED Course  I have reviewed the triage vital signs and the nursing notes.  Pertinent labs & imaging results that were available during my care of the patient were reviewed by me and considered in my medical decision making (see chart for details).  Patient is having very atypical chest pain that is very fleeting in nature. He was advised to be rechecked if he gets constant pain, fever, or shortness of breath.   Final Clinical Impressions(s) / ED Diagnoses   Final diagnoses:  Atypical chest pain    New Prescriptions OTC ibuprofen  Plan discharge  Devoria AlbeIva Whittaker Lenis, MD, Concha PyoFACEP    Josian Lanese, MD 05/02/17 [phone]

## 2017-09-13 ENCOUNTER — Emergency Department (HOSPITAL_COMMUNITY)
Admission: EM | Admit: 2017-09-13 | Discharge: 2017-09-13 | Disposition: A | Discharge status: Home / Self Care | Payer: Self-pay | Attending: Emergency Medicine | Admitting: Emergency Medicine

## 2017-09-13 ENCOUNTER — Encounter (HOSPITAL_COMMUNITY): Payer: Self-pay

## 2017-09-13 ENCOUNTER — Other Ambulatory Visit: Payer: Self-pay

## 2017-09-13 DIAGNOSIS — K644 Residual hemorrhoidal skin tags: Secondary | ICD-10-CM | POA: Insufficient documentation

## 2017-09-13 DIAGNOSIS — F17210 Nicotine dependence, cigarettes, uncomplicated: Secondary | ICD-10-CM | POA: Insufficient documentation

## 2017-09-13 DIAGNOSIS — Z885 Allergy status to narcotic agent status: Secondary | ICD-10-CM | POA: Insufficient documentation

## 2017-09-13 MED ORDER — TRAMADOL HCL 50 MG PO TABS: 50.0000 mg | ORAL_TABLET | Freq: Four times a day (QID) | ORAL | 0 refills | Status: DC | PRN

## 2017-09-13 MED ORDER — HYDROCORTISONE 2.5 % RE CREA: TOPICAL_CREAM | RECTAL | 0 refills | Status: DC

## 2017-09-13 NOTE — ED Provider Notes (Signed)
[facility]   CSN: [id number] Arrival date & time: 09/13/17  1121     History   Chief Complaint Chief Complaint  Patient presents with  . Abscess    HPI Mark Hammond is a 26 y.o. male.  HPI   26 year old male presents today with a 4-day history of rectal pain.  Patient notes that after trying to have a hard bowel movement the day after he noted a lump to his right rectum.  He notes exquisite pain and tenderness to palpation.  He denies any fever, chills, nausea or vomiting.  Patient reports a history of the same.  Patient reports using aspirin without significant improvement in his symptoms.  Patient notes the size of the bump has improved over the last several days.  Past Medical History:  Diagnosis Date  . Dental abscess     There are no active problems to display for this patient.   History reviewed. No pertinent surgical history.     Home Medications    Prior to Admission medications   Medication Sig Start Date End Date Taking? Authorizing Provider  bacitracin ointment Apply 1 application topically 2 (two) times daily. Patient not taking: Reported on 01/27/2015 01/17/15   Antony MaduraHumes, Kelly, PA-C  hydrocortisone (ANUSOL-HC) 2.5 % rectal cream Apply rectally 2 times daily 09/13/17   Najee Manninen, Tinnie GensJeffrey, PA-C  hydrocortisone 2.5 % lotion Apply topically 2 (two) times daily. Patient not taking: Reported on 01/17/2015 05/14/14   Santiago GladLaisure, Heather, PA-C  ibuprofen (ADVIL,MOTRIN) 600 MG tablet Take 1 tablet (600 mg total) by mouth every 6 (six) hours as needed. Patient not taking: Reported on 05/02/2017 04/26/17   Audry PiliMohr, Tyler, PA-C  penicillin v potassium (VEETID) 500 MG tablet Take 1 tablet (500 mg total) by mouth 2 (two) times daily. Patient not taking: Reported on 04/26/2017 03/14/17   Barrett HenleNadeau, Nicole Elizabeth, PA-C  traMADol (ULTRAM) 50 MG tablet Take 1 tablet (50 mg total) by mouth every 6 (six) hours as needed. 09/13/17   Eyvonne MechanicHedges,  Dion Parrow, PA-C    Family History Family History  Problem Relation Age of Onset  . Stroke Other     Social History Social History   Tobacco Use  . Smoking status: Current Every Day Smoker    Packs/day: 1.00    Types: Cigarettes  . Smokeless tobacco: Never Used  Substance Use Topics  . Alcohol use: Yes    Comment: Drinks 3-4 times a week. Last drink: 2 days  . Drug use: Yes    Types: Marijuana, Methamphetamines    Comment: last used meth 1 week ago     Allergies   Codeine   Review of Systems Review of Systems  All other systems reviewed and are negative.    Physical Exam Updated Vital Signs BP (!) 150/94 (BP Location: Right Arm)   Pulse 77   Temp 99.2 F (37.3 C) (Oral)   Resp 16   Ht 5\' 5"  (1.651 m)   Wt 59 kg (130 lb)   SpO2 99%   BMI 21.63 kg/m   Physical Exam  Constitutional: He is oriented to person, place, and time. He appears well-developed and well-nourished.  HENT:  Head: Normocephalic and atraumatic.  Eyes: Conjunctivae are normal. Pupils are equal, round, and reactive to light. Right eye exhibits no discharge. Left eye exhibits no discharge. No scleral icterus.  Neck: Normal range of motion. No JVD present. No tracheal deviation present.  Pulmonary/Chest: Effort normal. No stridor.  Genitourinary:  Genitourinary  Comments: 0.5 cm hemorrhoid to the 5 o'clock position of the rectum; no surrounding redness or erythema is no internal hemorrhoids, masses or tenderness  Neurological: He is alert and oriented to person, place, and time. Coordination normal.  Psychiatric: He has a normal mood and affect. His behavior is normal. Judgment and thought content normal.  Nursing note and vitals reviewed.    ED Treatments / Results  Labs (all labs ordered are listed, but only abnormal results are displayed) Labs Reviewed - No data to display  EKG  EKG Interpretation None       Radiology No results found.  Procedures Procedures (including  critical care time)  Medications Ordered in ED Medications - No data to display   Initial Impression / Assessment and Plan / ED Course  I have reviewed the triage vital signs and the nursing notes.  Pertinent labs & imaging results that were available during my care of the patient were reviewed by me and considered in my medical decision making (see chart for details).      Final Clinical Impressions(s) / ED Diagnoses   Final diagnoses:  External hemorrhoid    Labs:   Imaging:  Consults:  Therapeutics:  Discharge Meds:   Assessment/Plan: Patient's presentation is most consistent with external hemorrhoid.  No signs of abscess or infection, no comp gating features.  Patient reports symptoms are improving.  He will be given a short course of pain medication, Anusol, encouraged to return immediately if symptoms worsen, follow-up with general surgery if they persist.      ED Discharge Orders        Ordered    hydrocortisone (ANUSOL-HC) 2.5 % rectal cream     09/13/17 1255    traMADol (ULTRAM) 50 MG tablet  Every 6 hours PRN     09/13/17 1255       Eyvonne MechanicHedges, Janelle Spellman, PA-C 09/13/17 1256    Bethann BerkshireZammit, Joseph, MD 09/14/17 1035

## 2017-09-13 NOTE — Discharge Instructions (Signed)
Please read attached information. If you experience any new or worsening signs or symptoms please return to the emergency room for evaluation. Please follow-up with your primary care provider or specialist as discussed. Please use medication prescribed only as directed and discontinue taking if you have any concerning signs or symptoms.   °

## 2017-09-13 NOTE — ED Triage Notes (Signed)
PT reports abscess to left of rectum x 4 days. Pt denies drainage, or known fever at home but reports chills. NAD VSS

## 2019-02-22 ENCOUNTER — Emergency Department (HOSPITAL_COMMUNITY): Payer: Self-pay

## 2019-02-22 ENCOUNTER — Other Ambulatory Visit: Payer: Self-pay

## 2019-02-22 ENCOUNTER — Emergency Department (HOSPITAL_COMMUNITY)
Admission: EM | Admit: 2019-02-22 | Discharge: 2019-02-22 | Disposition: A | Discharge status: Other Acute Inpatient Hospital | Payer: Self-pay | Attending: Emergency Medicine | Admitting: Emergency Medicine

## 2019-02-22 ENCOUNTER — Encounter (HOSPITAL_COMMUNITY): Payer: Self-pay | Admitting: Emergency Medicine

## 2019-02-22 DIAGNOSIS — T2102XA Burn of unspecified degree of abdominal wall, initial encounter: Secondary | ICD-10-CM | POA: Insufficient documentation

## 2019-02-22 DIAGNOSIS — R440 Auditory hallucinations: Secondary | ICD-10-CM | POA: Insufficient documentation

## 2019-02-22 DIAGNOSIS — Z23 Encounter for immunization: Secondary | ICD-10-CM | POA: Insufficient documentation

## 2019-02-22 DIAGNOSIS — R441 Visual hallucinations: Secondary | ICD-10-CM | POA: Insufficient documentation

## 2019-02-22 DIAGNOSIS — X76XXXA Intentional self-harm by smoke, fire and flames, initial encounter: Secondary | ICD-10-CM | POA: Insufficient documentation

## 2019-02-22 DIAGNOSIS — R45851 Suicidal ideations: Secondary | ICD-10-CM | POA: Insufficient documentation

## 2019-02-22 DIAGNOSIS — Z915 Personal history of self-harm: Secondary | ICD-10-CM | POA: Insufficient documentation

## 2019-02-22 DIAGNOSIS — T300 Burn of unspecified body region, unspecified degree: Secondary | ICD-10-CM

## 2019-02-22 DIAGNOSIS — F17210 Nicotine dependence, cigarettes, uncomplicated: Secondary | ICD-10-CM | POA: Insufficient documentation

## 2019-02-22 DIAGNOSIS — F1420 Cocaine dependence, uncomplicated: Secondary | ICD-10-CM | POA: Insufficient documentation

## 2019-02-22 DIAGNOSIS — F1220 Cannabis dependence, uncomplicated: Secondary | ICD-10-CM | POA: Insufficient documentation

## 2019-02-22 DIAGNOSIS — IMO0002 Reserved for concepts with insufficient information to code with codable children: Secondary | ICD-10-CM

## 2019-02-22 DIAGNOSIS — Y999 Unspecified external cause status: Secondary | ICD-10-CM | POA: Insufficient documentation

## 2019-02-22 DIAGNOSIS — Y9389 Activity, other specified: Secondary | ICD-10-CM | POA: Insufficient documentation

## 2019-02-22 DIAGNOSIS — Y929 Unspecified place or not applicable: Secondary | ICD-10-CM | POA: Insufficient documentation

## 2019-02-22 DIAGNOSIS — F1910 Other psychoactive substance abuse, uncomplicated: Secondary | ICD-10-CM

## 2019-02-22 DIAGNOSIS — R1032 Left lower quadrant pain: Secondary | ICD-10-CM | POA: Insufficient documentation

## 2019-02-22 LAB — CBC WITH DIFFERENTIAL/PLATELET
Abs Immature Granulocytes: 0.03 10*3/uL (ref 0.00–0.07)
Basophils Absolute: 0.1 10*3/uL (ref 0.0–0.1)
Basophils Relative: 1 %
Eosinophils Absolute: 0 10*3/uL (ref 0.0–0.5)
Eosinophils Relative: 0 %
HCT: 45.7 % (ref 39.0–52.0)
Hemoglobin: 16 g/dL (ref 13.0–17.0)
Immature Granulocytes: 0 %
Lymphocytes Relative: 24 %
Lymphs Abs: 2.5 10*3/uL (ref 0.7–4.0)
MCH: 32.2 pg (ref 26.0–34.0)
MCHC: 35 g/dL (ref 30.0–36.0)
MCV: 92 fL (ref 80.0–100.0)
Monocytes Absolute: 0.8 10*3/uL (ref 0.1–1.0)
Monocytes Relative: 8 %
Neutro Abs: 6.7 10*3/uL (ref 1.7–7.7)
Neutrophils Relative %: 67 %
Platelets: 247 10*3/uL (ref 150–400)
RBC: 4.97 MIL/uL (ref 4.22–5.81)
RDW: 12.7 % (ref 11.5–15.5)
WBC: 10 10*3/uL (ref 4.0–10.5)
nRBC: 0 % (ref 0.0–0.2)

## 2019-02-22 LAB — ACETAMINOPHEN LEVEL: Acetaminophen (Tylenol), Serum: <10 ug/mL — ABNORMAL LOW (ref 10–30)

## 2019-02-22 LAB — RAPID URINE DRUG SCREEN, HOSP PERFORMED
Amphetamines: NOT DETECTED
Barbiturates: NOT DETECTED
Benzodiazepines: NOT DETECTED
Cocaine: POSITIVE — AB
Opiates: NOT DETECTED
Tetrahydrocannabinol: POSITIVE — AB

## 2019-02-22 LAB — URINALYSIS, ROUTINE W REFLEX MICROSCOPIC
Bilirubin Urine: NEGATIVE
Glucose, UA: NEGATIVE mg/dL
Hgb urine dipstick: NEGATIVE
Ketones, ur: 20 mg/dL — AB
Leukocytes,Ua: NEGATIVE
Nitrite: NEGATIVE
Protein, ur: NEGATIVE mg/dL
Specific Gravity, Urine: 1.021 (ref 1.005–1.030)
pH: 6 (ref 5.0–8.0)

## 2019-02-22 LAB — ETHANOL: Alcohol, Ethyl (B): <10 mg/dL (ref <10)

## 2019-02-22 LAB — BASIC METABOLIC PANEL
Anion gap: 11 (ref 5–15)
BUN: 9 mg/dL (ref 6–20)
CO2: 24 mmol/L (ref 22–32)
Calcium: 9 mg/dL (ref 8.9–10.3)
Chloride: 103 mmol/L (ref 98–111)
Creatinine, Ser: 0.78 mg/dL (ref 0.61–1.24)
GFR calc Af Amer: >60 mL/min (ref >60)
GFR calc non Af Amer: >60 mL/min (ref >60)
Glucose, Bld: 105 mg/dL — ABNORMAL HIGH (ref 70–99)
Potassium: 3.2 mmol/L — ABNORMAL LOW (ref 3.5–5.1)
Sodium: 138 mmol/L (ref 135–145)

## 2019-02-22 LAB — SALICYLATE LEVEL: Salicylate Lvl: <7 mg/dL (ref 2.8–30.0)

## 2019-02-22 MED ORDER — POTASSIUM CHLORIDE CRYS ER 20 MEQ PO TBCR
40.0000 meq | EXTENDED_RELEASE_TABLET | Freq: Once | ORAL | Status: AC
  Administered 2019-02-22: 40 meq via ORAL
  Filled 2019-02-22: qty 2

## 2019-02-22 MED ORDER — BACITRACIN ZINC 500 UNIT/GM EX OINT
TOPICAL_OINTMENT | Freq: Two times a day (BID) | CUTANEOUS | Status: DC
  Administered 2019-02-22: 1 via TOPICAL
  Filled 2019-02-22: qty 0.9

## 2019-02-22 MED ORDER — TETANUS-DIPHTH-ACELL PERTUSSIS 5-2.5-18.5 LF-MCG/0.5 IM SUSP
0.5000 mL | Freq: Once | INTRAMUSCULAR | Status: AC
  Administered 2019-02-22: 0.5 mL via INTRAMUSCULAR
  Filled 2019-02-22: qty 0.5

## 2019-02-22 MED ORDER — BACITRACIN ZINC 500 UNIT/GM EX OINT: 1.0000 "application " | TOPICAL_OINTMENT | Freq: Two times a day (BID) | CUTANEOUS | 0 refills | Status: DC

## 2019-02-22 NOTE — ED Notes (Signed)
Pt alert and oriented. Pt denies any pain or discomfort at this time . Pt denies SI and HI. Pt denies c/o of Auditory and visual hallucinations. Pt reports that the hallucinations has stopped and gets really bad when he out in the streets. Pt safe will continue to monitor.
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
## 2019-02-22 NOTE — ED Notes (Signed)
Pt changing into paper scrubs

## 2019-02-22 NOTE — ED Notes (Signed)
Bed: [facility] Expected date:  Expected time:  Means of arrival:  Comments: 

## 2019-02-22 NOTE — Progress Notes (Signed)
Telepsych cart not working, ED attempting to locate iPad to complete assessment.  Princess Bruins, MSW, LCSW Therapeutic Triage Specialist  [phone]

## 2019-02-22 NOTE — ED Notes (Signed)
ED Provider at bedside. 

## 2019-02-22 NOTE — Discharge Instructions (Addendum)
Apply bacitracin twice daily to the wounds on your abdomen.   You may take tylenol and motrin for the pain in your back and for your jaw pain. The CT scan of you face did not show any fractures to the bones in your face.   Please follow-up with the resources that were provided for you to discharge paperwork.  Return the emergency department for new or worsening symptoms including suicidal thoughts, homicidal thoughts, hallucinations or any signs of infection to the wounds on your stomach.

## 2019-02-22 NOTE — Progress Notes (Signed)
Mark Conn, NP states the pt does not meet criteria for inpt tx. Pt has been recommended to follow up with OPT providers in order to establish ongoing MH treatment and SA treatment. TTS faxed resources to [phone] as requested. EDP Couture, Cortni S, PA-C and [place], [place] L, RN have been advised and in agreement with disposition.   Princess Bruins, MSW, LCSW Therapeutic Triage Specialist  [phone]
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
## 2019-02-22 NOTE — ED Notes (Signed)
Pt alert and oriented. Pt discharge instructions reviewed with patient. Pt verbalizes an understanding of instructions. Pt calm and cooperative. Pt walked out to the main ed waiting area.

## 2019-02-22 NOTE — BH Assessment (Addendum)
[facility] Assessment Progress Note Consult was noted at 1436. This writer attempted several times to contact [facility] staff to assist with putting tele-psych in patient's room.  Attempts to contact staff were not successful until 15:30. Tele-psych was noted at that time not to be connecting (message continued to say call ended although call never was connected). This Clinical research associate contacted IT to report issue at 15:45 at which time while IT was on the phone, machine connected without picture. This Clinical research associate informed IT that another office would be utilized to try to attempt. This Clinical research associate and [facility] NP attempted several times from provider office unsuccessful. This Clinical research associate contacted IT back at 1610 hours to report issue. The camera is not turing on.  IT contacted.  Incident number [id number].
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
## 2019-03-03 ENCOUNTER — Emergency Department (HOSPITAL_COMMUNITY): Payer: Self-pay

## 2019-03-03 ENCOUNTER — Other Ambulatory Visit: Payer: Self-pay

## 2019-03-03 ENCOUNTER — Encounter (HOSPITAL_COMMUNITY): Payer: Self-pay | Admitting: Emergency Medicine

## 2019-03-03 ENCOUNTER — Emergency Department (HOSPITAL_COMMUNITY)
Admission: EM | Admit: 2019-03-03 | Discharge: 2019-03-03 | Disposition: A | Discharge status: Home / Self Care | Payer: Self-pay | Attending: Emergency Medicine | Admitting: Emergency Medicine

## 2019-03-03 DIAGNOSIS — F17210 Nicotine dependence, cigarettes, uncomplicated: Secondary | ICD-10-CM | POA: Insufficient documentation

## 2019-03-03 DIAGNOSIS — S022XXA Fracture of nasal bones, initial encounter for closed fracture: Secondary | ICD-10-CM | POA: Insufficient documentation

## 2019-03-03 DIAGNOSIS — Y929 Unspecified place or not applicable: Secondary | ICD-10-CM | POA: Insufficient documentation

## 2019-03-03 DIAGNOSIS — Y999 Unspecified external cause status: Secondary | ICD-10-CM | POA: Insufficient documentation

## 2019-03-03 DIAGNOSIS — R110 Nausea: Secondary | ICD-10-CM | POA: Insufficient documentation

## 2019-03-03 DIAGNOSIS — Y939 Activity, unspecified: Secondary | ICD-10-CM | POA: Insufficient documentation

## 2019-03-03 LAB — CBC WITH DIFFERENTIAL/PLATELET
Abs Immature Granulocytes: 0.03 10*3/uL (ref 0.00–0.07)
Basophils Absolute: 0.1 10*3/uL (ref 0.0–0.1)
Basophils Relative: 1 %
Eosinophils Absolute: 0.1 10*3/uL (ref 0.0–0.5)
Eosinophils Relative: 1 %
HCT: 46.8 % (ref 39.0–52.0)
Hemoglobin: 16.1 g/dL (ref 13.0–17.0)
Immature Granulocytes: 0 %
Lymphocytes Relative: 31 %
Lymphs Abs: 3.1 10*3/uL (ref 0.7–4.0)
MCH: 31.2 pg (ref 26.0–34.0)
MCHC: 34.4 g/dL (ref 30.0–36.0)
MCV: 90.7 fL (ref 80.0–100.0)
Monocytes Absolute: 0.7 10*3/uL (ref 0.1–1.0)
Monocytes Relative: 7 %
Neutro Abs: 5.9 10*3/uL (ref 1.7–7.7)
Neutrophils Relative %: 60 %
Platelets: 294 10*3/uL (ref 150–400)
RBC: 5.16 MIL/uL (ref 4.22–5.81)
RDW: 12.5 % (ref 11.5–15.5)
WBC: 9.8 10*3/uL (ref 4.0–10.5)
nRBC: 0 % (ref 0.0–0.2)

## 2019-03-03 LAB — COMPREHENSIVE METABOLIC PANEL
ALT: 37 U/L (ref 0–44)
AST: 43 U/L — ABNORMAL HIGH (ref 15–41)
Albumin: 4.3 g/dL (ref 3.5–5.0)
Alkaline Phosphatase: 93 U/L (ref 38–126)
Anion gap: 10 (ref 5–15)
BUN: 5 mg/dL — ABNORMAL LOW (ref 6–20)
CO2: 25 mmol/L (ref 22–32)
Calcium: 8.7 mg/dL — ABNORMAL LOW (ref 8.9–10.3)
Chloride: 105 mmol/L (ref 98–111)
Creatinine, Ser: 0.7 mg/dL (ref 0.61–1.24)
GFR calc Af Amer: >60 mL/min (ref >60)
GFR calc non Af Amer: >60 mL/min (ref >60)
Glucose, Bld: 149 mg/dL — ABNORMAL HIGH (ref 70–99)
Potassium: 3.5 mmol/L (ref 3.5–5.1)
Sodium: 140 mmol/L (ref 135–145)
Total Bilirubin: 0.6 mg/dL (ref 0.3–1.2)
Total Protein: 7.5 g/dL (ref 6.5–8.1)

## 2019-03-03 LAB — URINALYSIS, ROUTINE W REFLEX MICROSCOPIC
Bilirubin Urine: NEGATIVE
Glucose, UA: NEGATIVE mg/dL
Hgb urine dipstick: NEGATIVE
Ketones, ur: NEGATIVE mg/dL
Leukocytes,Ua: NEGATIVE
Nitrite: NEGATIVE
Protein, ur: NEGATIVE mg/dL
Specific Gravity, Urine: 1.014 (ref 1.005–1.030)
pH: 9 — ABNORMAL HIGH (ref 5.0–8.0)

## 2019-03-03 LAB — LIPASE, BLOOD: Lipase: 24 U/L (ref 11–51)

## 2019-03-03 MED ORDER — SODIUM CHLORIDE 0.9% FLUSH
3.0000 mL | Freq: Once | INTRAVENOUS | Status: AC
  Administered 2019-03-03: 3 mL via INTRAVENOUS

## 2019-03-03 MED ORDER — ONDANSETRON 4 MG PO TBDP
4.0000 mg | ORAL_TABLET | Freq: Once | ORAL | Status: AC | PRN
  Administered 2019-03-03: 4 mg via ORAL
  Filled 2019-03-03: qty 1

## 2019-03-03 MED ORDER — ONDANSETRON HCL 4 MG/2ML IJ SOLN
4.0000 mg | Freq: Once | INTRAMUSCULAR | Status: AC
  Administered 2019-03-03: 4 mg via INTRAVENOUS
  Filled 2019-03-03: qty 2

## 2019-03-03 MED ORDER — SODIUM CHLORIDE 0.9 % IV BOLUS
1000.0000 mL | Freq: Once | INTRAVENOUS | Status: AC
  Administered 2019-03-03: 1000 mL via INTRAVENOUS

## 2019-03-03 MED ORDER — ONDANSETRON 8 MG PO TBDP: 8.0000 mg | ORAL_TABLET | Freq: Three times a day (TID) | ORAL | 0 refills | Status: DC | PRN

## 2019-03-03 NOTE — ED Notes (Signed)
ED Provider at bedside. 
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
## 2019-03-05 ENCOUNTER — Emergency Department (HOSPITAL_COMMUNITY): Payer: Self-pay

## 2019-03-05 ENCOUNTER — Emergency Department (HOSPITAL_COMMUNITY)
Admission: EM | Admit: 2019-03-05 | Discharge: 2019-03-06 | Disposition: A | Discharge status: Other Acute Inpatient Hospital | Payer: Self-pay | Attending: Emergency Medicine | Admitting: Emergency Medicine

## 2019-03-05 ENCOUNTER — Other Ambulatory Visit: Payer: Self-pay

## 2019-03-05 ENCOUNTER — Encounter (HOSPITAL_COMMUNITY): Payer: Self-pay

## 2019-03-05 DIAGNOSIS — R45851 Suicidal ideations: Secondary | ICD-10-CM | POA: Insufficient documentation

## 2019-03-05 DIAGNOSIS — R443 Hallucinations, unspecified: Secondary | ICD-10-CM

## 2019-03-05 DIAGNOSIS — R440 Auditory hallucinations: Secondary | ICD-10-CM | POA: Insufficient documentation

## 2019-03-05 DIAGNOSIS — F1010 Alcohol abuse, uncomplicated: Secondary | ICD-10-CM | POA: Insufficient documentation

## 2019-03-05 DIAGNOSIS — F332 Major depressive disorder, recurrent severe without psychotic features: Secondary | ICD-10-CM | POA: Insufficient documentation

## 2019-03-05 DIAGNOSIS — R509 Fever, unspecified: Secondary | ICD-10-CM | POA: Insufficient documentation

## 2019-03-05 DIAGNOSIS — Z20828 Contact with and (suspected) exposure to other viral communicable diseases: Secondary | ICD-10-CM | POA: Insufficient documentation

## 2019-03-05 DIAGNOSIS — F17210 Nicotine dependence, cigarettes, uncomplicated: Secondary | ICD-10-CM | POA: Insufficient documentation

## 2019-03-05 LAB — CBC
HCT: 47.3 % (ref 39.0–52.0)
Hemoglobin: 16.6 g/dL (ref 13.0–17.0)
MCH: 31.9 pg (ref 26.0–34.0)
MCHC: 35.1 g/dL (ref 30.0–36.0)
MCV: 91 fL (ref 80.0–100.0)
Platelets: 390 10*3/uL (ref 150–400)
RBC: 5.2 MIL/uL (ref 4.22–5.81)
RDW: 12.5 % (ref 11.5–15.5)
WBC: 14.9 10*3/uL — ABNORMAL HIGH (ref 4.0–10.5)
nRBC: 0 % (ref 0.0–0.2)

## 2019-03-05 LAB — ACETAMINOPHEN LEVEL: Acetaminophen (Tylenol), Serum: <10 ug/mL — ABNORMAL LOW (ref 10–30)

## 2019-03-05 LAB — COMPREHENSIVE METABOLIC PANEL
ALT: 33 U/L (ref 0–44)
AST: 46 U/L — ABNORMAL HIGH (ref 15–41)
Albumin: 4.3 g/dL (ref 3.5–5.0)
Alkaline Phosphatase: 100 U/L (ref 38–126)
Anion gap: 18 — ABNORMAL HIGH (ref 5–15)
BUN: 5 mg/dL — ABNORMAL LOW (ref 6–20)
CO2: 18 mmol/L — ABNORMAL LOW (ref 22–32)
Calcium: 8.8 mg/dL — ABNORMAL LOW (ref 8.9–10.3)
Chloride: 101 mmol/L (ref 98–111)
Creatinine, Ser: 1.06 mg/dL (ref 0.61–1.24)
GFR calc Af Amer: >60 mL/min (ref >60)
GFR calc non Af Amer: >60 mL/min (ref >60)
Glucose, Bld: 122 mg/dL — ABNORMAL HIGH (ref 70–99)
Potassium: 3.1 mmol/L — ABNORMAL LOW (ref 3.5–5.1)
Sodium: 137 mmol/L (ref 135–145)
Total Bilirubin: 1 mg/dL (ref 0.3–1.2)
Total Protein: 7.8 g/dL (ref 6.5–8.1)

## 2019-03-05 LAB — ETHANOL: Alcohol, Ethyl (B): 171 mg/dL — ABNORMAL HIGH (ref <10)

## 2019-03-05 LAB — SALICYLATE LEVEL: Salicylate Lvl: <7 mg/dL (ref 2.8–30.0)

## 2019-03-05 MED ORDER — ZIPRASIDONE MESYLATE 20 MG IM SOLR
20.0000 mg | Freq: Once | INTRAMUSCULAR | Status: AC
  Administered 2019-03-05: 20 mg via INTRAMUSCULAR
  Filled 2019-03-05: qty 20

## 2019-03-05 MED ORDER — SODIUM CHLORIDE 0.9 % IV BOLUS
1000.0000 mL | Freq: Once | INTRAVENOUS | Status: AC
  Administered 2019-03-06: 1000 mL via INTRAVENOUS

## 2019-03-05 NOTE — ED Notes (Signed)
IVC paperwork filled out by [facility] and Jeanette Caprice, PA faxed to magistrate by Diplomatic Services operational officer at this time
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
## 2019-03-05 NOTE — ED Notes (Signed)
Verbal order received for 4 pt restraints per [place], [name]
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
## 2019-03-05 NOTE — ED Notes (Signed)
Per staffing, sitter available at 2300

## 2019-03-05 NOTE — ED Notes (Addendum)
Original IVC paperwork placed in red folder, three copies on pt folder

## 2019-03-05 NOTE — ED Notes (Signed)
XR at bedside

## 2019-03-05 NOTE — ED Notes (Signed)
Sitter present at bedside.
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
## 2019-03-05 NOTE — ED Notes (Addendum)
Pt changed into purple scrubs, belongings removed ( 1 bag), pt wanded by security, valuables with security

## 2019-03-06 ENCOUNTER — Inpatient Hospital Stay (HOSPITAL_COMMUNITY)
Admission: AD | Admit: 2019-03-06 | Discharge: 2019-03-09 | DRG: 885 | Disposition: A | Discharge status: Home / Self Care | Payer: No Typology Code available for payment source | Source: Intra-hospital | Attending: Psychiatry | Admitting: Psychiatry

## 2019-03-06 ENCOUNTER — Encounter (HOSPITAL_COMMUNITY): Payer: Self-pay

## 2019-03-06 ENCOUNTER — Other Ambulatory Visit: Payer: Self-pay

## 2019-03-06 ENCOUNTER — Other Ambulatory Visit: Payer: Self-pay | Admitting: Behavioral Health

## 2019-03-06 DIAGNOSIS — F17210 Nicotine dependence, cigarettes, uncomplicated: Secondary | ICD-10-CM | POA: Diagnosis present

## 2019-03-06 DIAGNOSIS — F1210 Cannabis abuse, uncomplicated: Secondary | ICD-10-CM | POA: Diagnosis present

## 2019-03-06 DIAGNOSIS — F1420 Cocaine dependence, uncomplicated: Secondary | ICD-10-CM | POA: Diagnosis present

## 2019-03-06 DIAGNOSIS — Z915 Personal history of self-harm: Secondary | ICD-10-CM

## 2019-03-06 DIAGNOSIS — S0031XA Abrasion of nose, initial encounter: Secondary | ICD-10-CM | POA: Diagnosis present

## 2019-03-06 DIAGNOSIS — R45851 Suicidal ideations: Secondary | ICD-10-CM | POA: Diagnosis present

## 2019-03-06 DIAGNOSIS — F329 Major depressive disorder, single episode, unspecified: Secondary | ICD-10-CM | POA: Diagnosis not present

## 2019-03-06 DIAGNOSIS — F1020 Alcohol dependence, uncomplicated: Secondary | ICD-10-CM | POA: Diagnosis present

## 2019-03-06 DIAGNOSIS — Y906 Blood alcohol level of 120-199 mg/100 ml: Secondary | ICD-10-CM | POA: Diagnosis present

## 2019-03-06 DIAGNOSIS — F333 Major depressive disorder, recurrent, severe with psychotic symptoms: Secondary | ICD-10-CM | POA: Diagnosis present

## 2019-03-06 DIAGNOSIS — Z885 Allergy status to narcotic agent status: Secondary | ICD-10-CM

## 2019-03-06 LAB — BASIC METABOLIC PANEL
Anion gap: 11 (ref 5–15)
BUN: <5 mg/dL — ABNORMAL LOW (ref 6–20)
CO2: 24 mmol/L (ref 22–32)
Calcium: 7.9 mg/dL — ABNORMAL LOW (ref 8.9–10.3)
Chloride: 103 mmol/L (ref 98–111)
Creatinine, Ser: 0.77 mg/dL (ref 0.61–1.24)
GFR calc Af Amer: >60 mL/min (ref >60)
GFR calc non Af Amer: >60 mL/min (ref >60)
Glucose, Bld: 132 mg/dL — ABNORMAL HIGH (ref 70–99)
Potassium: 3.2 mmol/L — ABNORMAL LOW (ref 3.5–5.1)
Sodium: 138 mmol/L (ref 135–145)

## 2019-03-06 LAB — URINALYSIS, ROUTINE W REFLEX MICROSCOPIC
Bilirubin Urine: NEGATIVE
Glucose, UA: NEGATIVE mg/dL
Hgb urine dipstick: NEGATIVE
Ketones, ur: 20 mg/dL — AB
Leukocytes,Ua: NEGATIVE
Nitrite: NEGATIVE
Protein, ur: NEGATIVE mg/dL
Specific Gravity, Urine: 1.012 (ref 1.005–1.030)
pH: 5 (ref 5.0–8.0)

## 2019-03-06 LAB — LACTIC ACID, PLASMA: Lactic Acid, Venous: 1.9 mmol/L (ref 0.5–1.9)

## 2019-03-06 LAB — SARS CORONAVIRUS 2 BY RT PCR (HOSPITAL ORDER, PERFORMED IN ~~LOC~~ HOSPITAL LAB): SARS Coronavirus 2: NEGATIVE

## 2019-03-06 LAB — RAPID URINE DRUG SCREEN, HOSP PERFORMED
Amphetamines: NOT DETECTED
Barbiturates: NOT DETECTED
Benzodiazepines: NOT DETECTED
Cocaine: POSITIVE — AB
Opiates: NOT DETECTED
Tetrahydrocannabinol: POSITIVE — AB

## 2019-03-06 LAB — OSMOLALITY: Osmolality: 319 mOsm/kg — ABNORMAL HIGH (ref 275–295)

## 2019-03-06 MED ORDER — CHLORDIAZEPOXIDE HCL 25 MG PO CAPS
25.0000 mg | ORAL_CAPSULE | Freq: Every day | ORAL | Status: AC
  Administered 2019-03-09: 07:00:00 25 mg via ORAL
  Filled 2019-03-06: qty 1

## 2019-03-06 MED ORDER — ACETAMINOPHEN 325 MG PO TABS
650.0000 mg | ORAL_TABLET | Freq: Four times a day (QID) | ORAL | Status: DC | PRN
  Administered 2019-03-06 – 2019-03-09 (×3): 650 mg via ORAL
  Filled 2019-03-06 (×3): qty 2

## 2019-03-06 MED ORDER — LOPERAMIDE HCL 2 MG PO CAPS: 2.0000 mg | ORAL_CAPSULE | ORAL | Status: DC | PRN

## 2019-03-06 MED ORDER — CHLORDIAZEPOXIDE HCL 25 MG PO CAPS: 25.0000 mg | ORAL_CAPSULE | Freq: Four times a day (QID) | ORAL | Status: DC | PRN

## 2019-03-06 MED ORDER — VITAMIN B-1 100 MG PO TABS
100.0000 mg | ORAL_TABLET | Freq: Every day | ORAL | Status: DC
  Administered 2019-03-07 – 2019-03-09 (×3): 100 mg via ORAL
  Filled 2019-03-06 (×5): qty 1

## 2019-03-06 MED ORDER — HYDROXYZINE HCL 25 MG PO TABS
25.0000 mg | ORAL_TABLET | Freq: Four times a day (QID) | ORAL | Status: DC | PRN
  Administered 2019-03-07: 25 mg via ORAL

## 2019-03-06 MED ORDER — CHLORDIAZEPOXIDE HCL 25 MG PO CAPS
25.0000 mg | ORAL_CAPSULE | Freq: Four times a day (QID) | ORAL | Status: DC | PRN
  Administered 2019-03-06: 25 mg via ORAL
  Filled 2019-03-06: qty 1

## 2019-03-06 MED ORDER — ONDANSETRON 4 MG PO TBDP
4.0000 mg | ORAL_TABLET | Freq: Four times a day (QID) | ORAL | Status: DC | PRN
  Administered 2019-03-07: 4 mg via ORAL
  Filled 2019-03-06: qty 1

## 2019-03-06 MED ORDER — THIAMINE HCL 100 MG/ML IJ SOLN: 100.0000 mg | Freq: Once | INTRAMUSCULAR | Status: DC

## 2019-03-06 MED ORDER — ACETAMINOPHEN 650 MG RE SUPP
650.0000 mg | Freq: Once | RECTAL | Status: AC
  Administered 2019-03-06: 01:00:00 650 mg via RECTAL
  Filled 2019-03-06: qty 1

## 2019-03-06 MED ORDER — CHLORDIAZEPOXIDE HCL 25 MG PO CAPS
25.0000 mg | ORAL_CAPSULE | ORAL | Status: DC
  Administered 2019-03-08: 25 mg via ORAL
  Filled 2019-03-06 (×2): qty 1

## 2019-03-06 MED ORDER — HYDROXYZINE HCL 25 MG PO TABS
25.0000 mg | ORAL_TABLET | Freq: Four times a day (QID) | ORAL | Status: DC | PRN
  Administered 2019-03-08: 25 mg via ORAL
  Filled 2019-03-06 (×3): qty 1
  Filled 2019-03-06: qty 10

## 2019-03-06 MED ORDER — SODIUM CHLORIDE 0.9 % IV BOLUS
1000.0000 mL | Freq: Once | INTRAVENOUS | Status: AC
  Administered 2019-03-06: 1000 mL via INTRAVENOUS

## 2019-03-06 MED ORDER — FLUOXETINE HCL 20 MG PO CAPS
20.0000 mg | ORAL_CAPSULE | Freq: Every day | ORAL | Status: DC
  Administered 2019-03-06 – 2019-03-09 (×4): 20 mg via ORAL
  Filled 2019-03-06 (×2): qty 7
  Filled 2019-03-06 (×4): qty 1

## 2019-03-06 MED ORDER — POTASSIUM CHLORIDE CRYS ER 20 MEQ PO TBCR
40.0000 meq | EXTENDED_RELEASE_TABLET | Freq: Once | ORAL | Status: AC
  Administered 2019-03-06: 10:00:00 40 meq via ORAL
  Filled 2019-03-06: qty 2

## 2019-03-06 MED ORDER — CHLORDIAZEPOXIDE HCL 25 MG PO CAPS
25.0000 mg | ORAL_CAPSULE | Freq: Four times a day (QID) | ORAL | Status: AC
  Administered 2019-03-06: 25 mg via ORAL
  Filled 2019-03-06: qty 1

## 2019-03-06 MED ORDER — CARIPRAZINE HCL 1.5 MG PO CAPS
1.5000 mg | ORAL_CAPSULE | Freq: Every day | ORAL | Status: DC
  Administered 2019-03-06 – 2019-03-09 (×4): 1.5 mg via ORAL
  Filled 2019-03-06 (×5): qty 1
  Filled 2019-03-06 (×2): qty 7

## 2019-03-06 MED ORDER — VITAMIN B-1 100 MG PO TABS
100.0000 mg | ORAL_TABLET | Freq: Every day | ORAL | Status: DC
  Administered 2019-03-06: 100 mg via ORAL
  Filled 2019-03-06 (×3): qty 1

## 2019-03-06 MED ORDER — FOLIC ACID 1 MG PO TABS
1.0000 mg | ORAL_TABLET | Freq: Every day | ORAL | Status: DC
  Administered 2019-03-06 – 2019-03-09 (×4): 1 mg via ORAL
  Filled 2019-03-06 (×7): qty 1

## 2019-03-06 MED ORDER — TRAZODONE HCL 50 MG PO TABS
50.0000 mg | ORAL_TABLET | Freq: Every evening | ORAL | Status: DC | PRN
  Administered 2019-03-06 – 2019-03-07 (×2): 50 mg via ORAL
  Filled 2019-03-06 (×2): qty 1
  Filled 2019-03-06: qty 7
  Filled 2019-03-06: qty 1

## 2019-03-06 MED ORDER — ADULT MULTIVITAMIN W/MINERALS CH
1.0000 | ORAL_TABLET | Freq: Every day | ORAL | Status: DC
  Administered 2019-03-06 – 2019-03-09 (×4): 1 via ORAL
  Filled 2019-03-06 (×6): qty 1

## 2019-03-06 MED ORDER — CHLORDIAZEPOXIDE HCL 25 MG PO CAPS
25.0000 mg | ORAL_CAPSULE | Freq: Three times a day (TID) | ORAL | Status: AC
  Administered 2019-03-07 (×2): 25 mg via ORAL
  Filled 2019-03-06 (×2): qty 1

## 2019-03-06 MED ORDER — ALUM & MAG HYDROXIDE-SIMETH 200-200-20 MG/5ML PO SUSP: 30.0000 mL | ORAL | Status: DC | PRN

## 2019-03-06 MED ORDER — ACETAMINOPHEN 650 MG RE SUPP: 650.0000 mg | Freq: Once | RECTAL | Status: DC

## 2019-03-06 MED ORDER — ACETAMINOPHEN 325 MG PO TABS
650.0000 mg | ORAL_TABLET | Freq: Once | ORAL | Status: AC
  Administered 2019-03-06: 650 mg via ORAL
  Filled 2019-03-06: qty 2
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
## 2019-03-06 NOTE — Progress Notes (Signed)
Pt accepted to [facility], Bed 306-2  Denzil Magnuson, NP is the accepting provider.  Landry Mellow, MD is the attending provider.  Call report to  [phone]  @ [facility] ED notified.   Pt is Voluntary.  Pt may be transported by [facility]  Pt scheduled  to arrive at [facility] as soon as transport can be arranged.
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
## 2019-03-06 NOTE — ED Notes (Signed)
[employer] at bedside for transport.  Belongings and paperwork given to officers.
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
## 2019-03-06 NOTE — Tx Team (Signed)
Initial Treatment Plan 03/06/2019 5:37 PM Mark Hammond HER:[id number]    PATIENT STRESSORS: Medication change or noncompliance Substance abuse   PATIENT STRENGTHS: Communication skills General fund of knowledge   PATIENT IDENTIFIED PROBLEMS: I need help with my                      DISCHARGE CRITERIA:  Improved stabilization in mood, thinking, and/or behavior  PRELIMINARY DISCHARGE PLAN: Return to previous living arrangement  PATIENT/FAMILY INVOLVEMENT: This treatment plan has been presented to and reviewed with the patient, Mark Hammond, and/or family member. The patient and family have been given the opportunity to ask questions and make suggestions.  Jerrye Bushy, RN 03/06/2019, 5:37 PM

## 2019-03-06 NOTE — BH Assessment (Signed)
Tele Assessment Note   Patient Name: Mark Hammond MRN: [id number] Referring Physician: Dr. Charm Barges Location of Patient: [facility] Location of Provider: Behavioral Health TTS Department  Rachit Fladger is an 28 y.o. male. Pt reports SI with a plan to either cut his throat or overdose. Pt denies HI and AVH. Pt reports 1 previous SI attempt last week. Pt reports 1 previous hospitalization when "I was a kid." Pt reports polysubstance about. Per Pt he uses crack cocaine, alcohol, and prescription drugs. Pt denies current outpatient treatment.   Garlan Fillers, NP recommends inpatient treatment.   Diagnosis:  F33.2; polysubstance abuse  Past Medical History:  Past Medical History:  Diagnosis Date  . Dental abscess     History reviewed. No pertinent surgical history.  Family History:  Family History  Problem Relation Age of Onset  . Stroke Other     Social History:  reports that he has been smoking cigarettes. He has been smoking about 1.00 pack per day. He has never used smokeless tobacco. He reports current alcohol use. He reports current drug use. Drugs: Marijuana and Methamphetamines.  Additional Social History:  Alcohol / Drug Use Pain Medications: please see mar Prescriptions: please see mar Over the Counter: please see mar History of alcohol / drug use?: Yes Longest period of sobriety (when/how long): unknown Negative Consequences of Use: Financial, Legal, Personal relationships, Work / School Substance #1 Name of Substance 1: crack cocaine 1 - Age of First Use: unknown 1 - Amount (size/oz): unknown 1 - Frequency: unknown 1 - Duration: unknown 1 - Last Use / Amount: unknown Substance #2 Name of Substance 2: alcohol 2 - Age of First Use: unknown 2 - Amount (size/oz): unknown 2 - Frequency: unknown 2 - Duration: unknown 2 - Last Use / Amount: unknown  CIWA: CIWA-Ar BP: 131/79 Pulse Rate: 95 COWS:    Allergies:  Allergies  Allergen Reactions  . Codeine Other (See Comments)     Patient states parents told him he was allergic at a young age.    Home Medications: (Not in a hospital admission)   OB/GYN Status:  No LMP for male patient.  General Assessment Data Location of Assessment: [facility] ED TTS Assessment: In system Is this a Tele or Face-to-Face Assessment?: Tele Assessment Is this an Initial Assessment or a Re-assessment for this encounter?: Initial Assessment Patient Accompanied by:: N/A Language Other than English: No Living Arrangements: Other (Comment) What gender do you identify as?: Male Marital status: Single Maiden name: NA Pregnancy Status: No Living Arrangements: Spouse/significant other Can pt return to current living arrangement?: Yes Admission Status: Involuntary Petitioner: ED Attending Is patient capable of signing voluntary admission?: No Referral Source: Self/Family/Friend Insurance type: NA     Crisis Care Plan Living Arrangements: Spouse/significant other Legal Guardian: Other:(self) Name of Psychiatrist: NA Name of Therapist: NA  Education Status Is patient currently in school?: No Is the patient employed, unemployed or receiving disability?: Unemployed  Risk to self with the past 6 months Suicidal Ideation: Yes-Currently Present Has patient been a risk to self within the past 6 months prior to admission? : No Suicidal Intent: Yes-Currently Present Has patient had any suicidal intent within the past 6 months prior to admission? : No Is patient at risk for suicide?: Yes Suicidal Plan?: Yes-Currently Present Has patient had any suicidal plan within the past 6 months prior to admission? : No Specify Current Suicidal Plan: to cut his throat or overdose Access to Means: Yes Specify Access to Suicidal Means: access to medication  and knife What has been your use of drugs/alcohol within the last 12 months?: cocaine, alcohol Previous Attempts/Gestures: Yes How many times?: 1 Other Self Harm Risks: NA Triggers for Past  Attempts: None known Intentional Self Injurious Behavior: None Comment - Self Injurious Behavior: NA Family Suicide History: No Recent stressful life event(s): (SA) Persecutory voices/beliefs?: No Depression: Yes Depression Symptoms: Tearfulness, Isolating, Loss of interest in usual pleasures, Feeling worthless/self pity, Feeling angry/irritable Substance abuse history and/or treatment for substance abuse?: Yes Suicide prevention information given to non-admitted patients: Not applicable  Risk to Others within the past 6 months Homicidal Ideation: No Does patient have any lifetime risk of violence toward others beyond the six months prior to admission? : No Thoughts of Harm to Others: No Current Homicidal Intent: No Current Homicidal Plan: No Access to Homicidal Means: No Identified Victim: NA History of harm to others?: No Assessment of Violence: None Noted Violent Behavior Description: NA Does patient have access to weapons?: No Criminal Charges Pending?: Yes Describe Pending Criminal Charges: trespassing Does patient have a court date: Yes Court Date: 03/11/19 Is patient on probation?: No  Psychosis Hallucinations: None noted Delusions: None noted  Mental Status Report Appearance/Hygiene: Unremarkable Eye Contact: Fair Motor Activity: Freedom of movement Speech: Logical/coherent Level of Consciousness: Alert Mood: Depressed Affect: Depressed Anxiety Level: None Thought Processes: Relevant, Coherent Judgement: Impaired Orientation: Person, Place, Time, Situation Obsessive Compulsive Thoughts/Behaviors: None  Cognitive Functioning Concentration: Normal Memory: Recent Intact, Remote Intact Is patient IDD: No Insight: Fair Impulse Control: Fair Appetite: Fair Have you had any weight changes? : No Change Sleep: No Change Total Hours of Sleep: 8 Vegetative Symptoms: None  ADLScreening [facility] Assessment Services) Patient's cognitive ability adequate to safely  complete daily activities?: Yes Patient able to express need for assistance with ADLs?: Yes Independently performs ADLs?: Yes (appropriate for developmental age)  Prior Inpatient Therapy Prior Inpatient Therapy: Yes Prior Therapy Dates: unknown Prior Therapy [place]ilty/[place] Reason for Treatment: substance abuse  Prior Outpatient Therapy Prior Outpatient Therapy: No Does patient have an ACCT team?: No Does patient have Intensive In-House Services?  : No Does patient have [facility] services? : No Does patient have P4CC services?: No  ADL Screening (condition at time of admission) Patient's cognitive ability adequate to safely complete daily activities?: Yes Is the patient deaf or have difficulty hearing?: No Does the patient have difficulty seeing, even when wearing glasses/contacts?: No Does the patient have difficulty concentrating, remembering, or making decisions?: No Patient able to express need for assistance with ADLs?: Yes Independently performs ADLs?: Yes (appropriate for developmental age) Does the patient have difficulty walking or climbing stairs?: No       Abuse/Neglect Assessment (Assessment to be complete while patient is alone) Abuse/Neglect Assessment Can Be Completed: Yes Physical Abuse: Denies Verbal Abuse: Denies Sexual Abuse: Denies Exploitation of patient/patient's resources: Denies     Merchant navy officerAdvance Directives (For Healthcare) Does Patient Have a Medical Advance Directive?: No Would patient like information on creating a medical advance directive?: No - Patient declined          Disposition:  Disposition Initial Assessment Completed for this Encounter: Yes  This service was provided via telemedicine using a 2-way, interactive audio and video technology.  Names of all persons participating in this telemedicine service and their role in this encounter. Name: [id number] Role: NP  Name:  Role:   Name:  Role:   Name:  Role:      Emmit PomfretLevette,Jaianna Nicoll D 03/06/2019 7:33 AM

## 2019-03-06 NOTE — Progress Notes (Signed)
[facility] Group Notes:  (Nursing/MHT/Case Management/Adjunct)  Date:  03/06/2019  Time:  2030  Type of Therapy:  wrap up group  Participation Level:  Active  Participation Quality:  Appropriate, Attentive, Sharing and Supportive  Affect:  Blunted and Depressed  Cognitive:  Appropriate  Insight:  Improving  Engagement in Group:  Engaged  Modes of Intervention:  Clarification, Education and Support  Summary of Progress/Problems:Pt said that it is good that he woke today and he is grateful for his life. Pt reports wanting to change his decision making and his faith.   Johann Capers S 03/06/2019, 11:24 PM

## 2019-03-06 NOTE — ED Notes (Signed)
Breakfast ordered 
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
## 2019-03-06 NOTE — ED Notes (Signed)
Restraints removed, patient cooperative and calm.
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
## 2019-03-07 LAB — LIPID PANEL
Cholesterol: 131 mg/dL (ref 0–200)
HDL: 66 mg/dL (ref >40)
LDL Cholesterol: 41 mg/dL (ref 0–99)
Total CHOL/HDL Ratio: 2 RATIO
Triglycerides: 119 mg/dL (ref <150)
VLDL: 24 mg/dL (ref 0–40)

## 2019-03-07 LAB — TSH: TSH: 0.529 u[IU]/mL (ref 0.350–4.500)

## 2019-03-07 LAB — HEMOGLOBIN A1C
Hgb A1c MFr Bld: 5.1 % (ref 4.8–5.6)
Mean Plasma Glucose: 99.67 mg/dL

## 2019-03-07 NOTE — Progress Notes (Signed)
Patient ID: Mark Hammond, male   DOB: 1991/09/12, 28 y.o.   MRN: [id number]   [facility] LCSW Group Therapy Note  Date/Time: 03/07/2019 @ 1:30pm  Type of Therapy/Topic:  Group Therapy:  Feelings about Diagnosis  Participation Level:  Did Not Attend   Mood: Did not attend   Description of Group:    This group will allow patients to explore their thoughts and feelings about diagnoses they have received. Patients will be guided to explore their level of understanding and acceptance of these diagnoses. Facilitator will encourage patients to process their thoughts and feelings about the reactions of others to their diagnosis, and will guide patients in identifying ways to discuss their diagnosis with significant others in their lives. This group will be process-oriented, with patients participating in exploration of their own experiences as well as giving and receiving support and challenge from other group members.   Therapeutic Goals: 1. Patient will demonstrate understanding of diagnosis as evidence by identifying two or more symptoms of the disorder:  2. Patient will be able to express two feelings regarding the diagnosis 3. Patient will demonstrate ability to communicate their needs through discussion and/or role plays  Summary of Patient Progress:   Pt did not attend.     Therapeutic Modalities:   Cognitive Behavioral Therapy Brief Therapy Feelings Identification   Stephannie Peters, LCSW

## 2019-03-07 NOTE — Progress Notes (Signed)
D: Patient denies SI, HI or visual hallucinations this evening. Patient presents as animated and anxious but pleasant and cooperative.  Pt. States that he has had a good day but became tearful when speaking about his "dreams" from last night.  Pt. States that they are scary, vivid and he believes are related to his "religious beliefs".  Pt. States that he also experiences auditory hallucinations "all the time" but states that they are "positive messages". Pt. Is visualized in the dayroom with some interaction.     A: Patient given emotional support from RN. Patient encouraged to come to staff with concerns and/or questions. Patient's medication routine continued. Patient's orders and plan of care reviewed.   R: Patient remains appropriate and cooperative. Will continue to monitor patient q15 minutes for safety.

## 2019-03-07 NOTE — Progress Notes (Signed)
[facility] MD Progress Note  03/07/2019 9:23 AM Mark Hammond  MRN:  [id number] Subjective:  Patient is in bed no eye contact and minimally engaged in the interview process but tells me that he is not overmedicated and believes this medications and in particular, his detox medications are adequate.  At any rate he is less engaged today his affect is constricted he denies wanting to harm self or others he denies cravings tremors or withdrawals, he may be simply crashing after his binging on cocaine/cannabis/alcohol Principal Problem: Alcohol and cocaine dependence he is reporting depressive symptoms Diagnosis: Active Problems:   MDD (major depressive disorder)  Total Time spent with patient: 20 minutes  Past Psychiatric History: as above  Past Medical History:  Past Medical History:  Diagnosis Date  . Dental abscess    History reviewed. No pertinent surgical history. Family History:  Family History  Problem Relation Age of Onset  . Stroke Other    Family Psychiatric  History: neg Social History:  Social History   Substance and Sexual Activity  Alcohol Use Yes   Comment: Drinks 3-4 times a week. Last drink: 2 days     Social History   Substance and Sexual Activity  Drug Use Yes  . Types: Marijuana, Methamphetamines   Comment: last used meth 1 week ago    Social History   Socioeconomic History  . Marital status: Single    Spouse name: Not on file  . Number of children: Not on file  . Years of education: Not on file  . Highest education level: Not on file  Occupational History  . Not on file  Social Needs  . Financial resource strain: Not on file  . Food insecurity:    Worry: Not on file    Inability: Not on file  . Transportation needs:    Medical: Not on file    Non-medical: Not on file  Tobacco Use  . Smoking status: Current Every Day Smoker    Packs/day: 1.00    Types: Cigarettes  . Smokeless tobacco: Never Used  Substance and Sexual Activity  . Alcohol use: Yes    Comment: Drinks 3-4 times a week. Last drink: 2 days  . Drug use: Yes    Types: Marijuana, Methamphetamines    Comment: last used meth 1 week ago  . Sexual activity: Not on file  Lifestyle  . Physical activity:    Days per week: Not on file    Minutes per session: Not on file  . Stress: Not on file  Relationships  . Social connections:    Talks on phone: Not on file    Gets together: Not on file    Attends religious service: Not on file    Active member of club or organization: Not on file    Attends meetings of clubs or organizations: Not on file    Relationship status: Not on file  Other Topics Concern  . Not on file  Social History Narrative  . Not on file   Additional Social History:                         Sleep: Good  Appetite:  Good  Current Medications: Current Facility-Administered Medications  Medication Dose Route Frequency Provider Last Rate Last Dose  . acetaminophen (TYLENOL) tablet 650 mg  650 mg Oral Q6H PRN Antonieta Pert, MD   650 mg at 03/06/19 2138  . alum & mag hydroxide-simeth (MAALOX/MYLANTA) 200-200-20 MG/5ML  suspension 30 mL  30 mL Oral Q4H PRN Antonieta Pert, MD      . cariprazine Leafy Kindle) capsule 1.5 mg  1.5 mg Oral Daily Malvin Johns, MD   1.5 mg at 03/07/19 0752  . chlordiazePOXIDE (LIBRIUM) capsule 25 mg  25 mg Oral Q6H PRN Malvin Johns, MD      . chlordiazePOXIDE (LIBRIUM) capsule 25 mg  25 mg Oral TID Malvin Johns, MD   25 mg at 03/07/19 0752   Followed by  . [START ON 03/08/2019] chlordiazePOXIDE (LIBRIUM) capsule 25 mg  25 mg Oral Nicki Guadalajara, MD       Followed by  . [START ON 03/09/2019] chlordiazePOXIDE (LIBRIUM) capsule 25 mg  25 mg Oral Daily Malvin Johns, MD      . FLUoxetine (PROZAC) capsule 20 mg  20 mg Oral Daily Malvin Johns, MD   20 mg at 03/07/19 0752  . folic acid (FOLVITE) tablet 1 mg  1 mg Oral Daily Antonieta Pert, MD   1 mg at 03/07/19 1610  . hydrOXYzine (ATARAX/VISTARIL) tablet 25 mg  25 mg  Oral Q6H PRN Antonieta Pert, MD      . hydrOXYzine (ATARAX/VISTARIL) tablet 25 mg  25 mg Oral Q6H PRN Malvin Johns, MD      . loperamide (IMODIUM) capsule 2-4 mg  2-4 mg Oral PRN Malvin Johns, MD      . multivitamin with minerals tablet 1 tablet  1 tablet Oral Daily Malvin Johns, MD   1 tablet at 03/07/19 [phone]  . ondansetron (ZOFRAN-ODT) disintegrating tablet 4 mg  4 mg Oral Q6H PRN Malvin Johns, MD      . thiamine (B-1) injection 100 mg  100 mg Intramuscular Once Malvin Johns, MD      . thiamine (VITAMIN B-1) tablet 100 mg  100 mg Oral Daily Malvin Johns, MD   100 mg at 03/07/19 0752  . traZODone (DESYREL) tablet 50 mg  50 mg Oral QHS PRN Antonieta Pert, MD   50 mg at 03/06/19 2137    Lab Results:  Results for orders placed or performed during the hospital encounter of 03/05/19 (from the past 48 hour(s))  Comprehensive metabolic panel     Status: Abnormal   Collection Time: 03/05/19 10:02 PM  Result Value Ref Range   Sodium 137 135 - 145 mmol/L   Potassium 3.1 (L) 3.5 - 5.1 mmol/L   Chloride 101 98 - 111 mmol/L   CO2 18 (L) 22 - 32 mmol/L   Glucose, Bld 122 (H) 70 - 99 mg/dL   BUN 5 (L) 6 - 20 mg/dL   Creatinine, Ser 5.40 0.61 - 1.24 mg/dL   Calcium 8.8 (L) 8.9 - 10.3 mg/dL   Total Protein 7.8 6.5 - 8.1 g/dL   Albumin 4.3 3.5 - 5.0 g/dL   AST 46 (H) 15 - 41 U/L   ALT 33 0 - 44 U/L   Alkaline Phosphatase 100 38 - 126 U/L   Total Bilirubin 1.0 0.3 - 1.2 mg/dL   GFR calc non Af Amer >60 >60 mL/min   GFR calc Af Amer >60 >60 mL/min   Anion gap 18 (H) 5 - 15    Comment: Performed at [facility], [address] [place], [place] [address]  Ethanol     Status: Abnormal   Collection Time: 03/05/19 10:02 PM  Result Value Ref Range   Alcohol, Ethyl (B) 171 (H) <10 mg/dL    Comment: (NOTE) Lowest detectable limit  for serum alcohol is 10 mg/dL. For medical purposes only. Performed at [facility], [address] [place], [place] [address]   Salicylate level      Status: None   Collection Time: 03/05/19 10:02 PM  Result Value Ref Range   Salicylate Lvl <7.0 2.8 - 30.0 mg/dL    Comment: Performed at [facility], [address] [place], [place] [address]  Acetaminophen level     Status: Abnormal   Collection Time: 03/05/19 10:02 PM  Result Value Ref Range   Acetaminophen (Tylenol), Serum <10 (L) 10 - 30 ug/mL    Comment: (NOTE) Therapeutic concentrations vary significantly. A range of 10-30 ug/mL  may be an effective concentration for many patients. However, some  are best treated at concentrations outside of this range. Acetaminophen concentrations >150 ug/mL at 4 hours after ingestion  and >50 ug/mL at 12 hours after ingestion are often associated with  toxic reactions. Performed at [facility], [address] [place], [place] [address]   cbc     Status: Abnormal   Collection Time: 03/05/19 10:02 PM  Result Value Ref Range   WBC 14.9 (H) 4.0 - 10.5 K/uL   RBC 5.20 4.22 - 5.81 MIL/uL   Hemoglobin 16.6 13.0 - 17.0 g/dL   HCT 91.4 78.2 - 95.6 %   MCV 91.0 80.0 - 100.0 fL   MCH 31.9 26.0 - 34.0 pg   MCHC 35.1 30.0 - 36.0 g/dL   RDW 21.3 08.6 - 57.8 %   Platelets 390 150 - 400 K/uL   nRBC 0.0 0.0 - 0.2 %    Comment: Performed at [facility], [address] [place], [place] [address]  Lactic acid, plasma     Status: None   Collection Time: 03/05/19 11:05 PM  Result Value Ref Range   Lactic Acid, Venous 1.9 0.5 - 1.9 mmol/L    Comment: Performed at [facility], [address], [place], [place] [address]  SARS Coronavirus 2 (CEPHEID - Performed in [facility] hospital lab), Hosp Order     Status: None   Collection Time: 03/05/19 11:16 PM  Result Value Ref Range   SARS Coronavirus 2 NEGATIVE NEGATIVE    Comment: (NOTE) If result is NEGATIVE SARS-CoV-2 target nucleic acids are NOT DETECTED. The SARS-CoV-2 RNA is generally detectable in upper and lower  respiratory specimens during the acute phase of  infection. The lowest  concentration of SARS-CoV-2 viral copies this assay can detect is 250  copies / mL. A negative result does not preclude SARS-CoV-2 infection  and should not be used as the sole basis for treatment or other  patient management decisions.  A negative result may occur with  improper specimen collection / handling, submission of specimen other  than nasopharyngeal swab, presence of viral mutation(s) within the  areas targeted by this assay, and inadequate number of viral copies  (<250 copies / mL). A negative result must be combined with clinical  observations, patient history, and epidemiological information. If result is POSITIVE SARS-CoV-2 target nucleic acids are DETECTED. The SARS-CoV-2 RNA is generally detectable in upper and lower  respiratory specimens dur ing the acute phase of infection.  Positive  results are indicative of active infection with SARS-CoV-2.  Clinical  correlation with patient history and other diagnostic information is  necessary to determine patient infection status.  Positive results do  not rule out bacterial infection or co-infection with other viruses. If result  is PRESUMPTIVE POSTIVE SARS-CoV-2 nucleic acids MAY BE PRESENT.   A presumptive positive result was obtained on the submitted specimen  and confirmed on repeat testing.  While 2019 novel coronavirus  (SARS-CoV-2) nucleic acids may be present in the submitted sample  additional confirmatory testing may be necessary for epidemiological  and / or clinical management purposes  to differentiate between  SARS-CoV-2 and other Sarbecovirus currently known to infect humans.  If clinically indicated additional testing with an alternate test  methodology [phone]([id number]) is advised. The SARS-CoV-2 RNA is generally  detectable in upper and lower respiratory sp ecimens during the acute  phase of infection. The expected result is Negative. Fact Sheet for Patients:   BoilerBrush.com.cyhttps://www.fda.gov/media/136312/download Fact Sheet for Healthcare Providers: https://Mark.com/https://www.fda.gov/media/136313/download This test is not yet approved or cleared by the [place] FDA and has been authorized for detection and/or diagnosis of SARS-CoV-2 by FDA under an Emergency Use Authorization (EUA).  This EUA will remain in effect (meaning this test can be used) for the duration of the COVID-19 declaration under Section 564(b)(1) of the Act, 21 U.S.C. section 360bbb-3(b)(1), unless the authorization is terminated or revoked sooner. Performed at [facility], [address] [place], [place] [address]   Osmolality     Status: Abnormal   Collection Time: 03/05/19 11:30 PM  Result Value Ref Range   Osmolality 319 (H) 275 - 295 mOsm/kg    Comment: Performed at [facility], [address] [place], [place] [address]  Rapid urine drug screen (hospital performed)     Status: Abnormal   Collection Time: 03/06/19  2:44 AM  Result Value Ref Range   Opiates NONE DETECTED NONE DETECTED   Cocaine POSITIVE (A) NONE DETECTED   Benzodiazepines NONE DETECTED NONE DETECTED   Amphetamines NONE DETECTED NONE DETECTED   Tetrahydrocannabinol POSITIVE (A) NONE DETECTED   Barbiturates NONE DETECTED NONE DETECTED    Comment: (NOTE) DRUG SCREEN FOR MEDICAL PURPOSES ONLY.  IF CONFIRMATION IS NEEDED FOR ANY PURPOSE, NOTIFY LAB WITHIN 5 DAYS. LOWEST DETECTABLE LIMITS FOR URINE DRUG SCREEN Drug Class                     Cutoff (ng/mL) Amphetamine and metabolites    1000 Barbiturate and metabolites    200 Benzodiazepine                 200 Tricyclics and metabolites     300 Opiates and metabolites        300 Cocaine and metabolites        300 THC                            50 Performed at [facility], [address] [place], [place] [address]   Basic metabolic panel     Status: Abnormal   Collection Time: 03/06/19  5:10 AM  Result Value Ref Range   Sodium 138 135 - 145  mmol/L   Potassium 3.2 (L) 3.5 - 5.1 mmol/L   Chloride 103 98 - 111 mmol/L   CO2 24 22 - 32 mmol/L   Glucose, Bld 132 (H) 70 - 99 mg/dL   BUN <5 (L) 6 - 20 mg/dL   Creatinine, Ser 2.950.77 0.61 - 1.24 mg/dL   Calcium 7.9 (L) 8.9 - 10.3 mg/dL   GFR calc non Af Amer >60 >60 mL/min   GFR calc Af Amer >60 >60 mL/min   Anion  gap 11 5 - 15    Comment: Performed at [facility], [address] [place], [place] [address]    Blood Alcohol level:  Lab Results  Component Value Date   ETH 171 (H) 03/05/2019   ETH <10 02/22/2019    Metabolic Disorder Labs: No results found for: HGBA1C, MPG No results found for: PROLACTIN No results found for: CHOL, TRIG, HDL, CHOLHDL, VLDL, LDLCALC  Physical Findings: AIMS: Facial and Oral Movements Muscles of Facial Expression: None, normal Lips and Perioral Area: None, normal Jaw: None, normal Tongue: None, normal,Extremity Movements Upper (arms, wrists, hands, fingers): None, normal Lower (legs, knees, ankles, toes): None, normal, Trunk Movements Neck, shoulders, hips: None, normal, Overall Severity Severity of abnormal movements (highest score from questions above): None, normal Incapacitation due to abnormal movements: None, normal Patient's awareness of abnormal movements (rate only patient's report): No Awareness, Dental Status Current problems with teeth and/or dentures?: Yes Does patient usually wear dentures?: No  CIWA:  CIWA-Ar Total: 0 COWS:     Musculoskeletal: Strength & Muscle Tone: within normal limits Gait & Station: normal Patient leans: N/A  Psychiatric Specialty Exam: Physical Exam  ROS  Blood pressure (!) 135/99, pulse 70, temperature 98.7 F (37.1 C), temperature source Oral, resp. rate 18, height 5\' 5"  (1.651 m), weight 61.2 kg, SpO2 100 %.Body mass index is 22.47 kg/m.  General Appearance: Casual  Eye Contact:  Absent  Speech:  Garbled and Slurred  Volume:  Decreased  Mood:  Dysphoric  Affect:  Congruent and  Constricted  Thought Process:  Linear and Descriptions of Associations: Tangential  Orientation:  Full (Time, Place, and Person)  Thought Content:  Logical and Tangential  Suicidal Thoughts:  No  Homicidal Thoughts:  No  Memory:  Immediate;   Fair  Judgement:  Fair  Insight:  Fair  Psychomotor Activity:  Normal  Concentration:  Concentration: Fair  Recall:  [employer] of Knowledge:  Fair  Language:  Fair  Akathisia:  Negative  Handed:  Right  AIMS (if indicated):     Assets:  Desire for Improvement  ADL's:  Intact  Cognition:  WNL  Sleep:  Number of Hours: 3.75     Treatment Plan Summary: Daily contact with patient to assess and evaluate symptoms and progress in treatment, Medication management and Plan Continue current cognitive and rehab based therapies no change in detox regimen or antidepressants  Laurita Peron, MD 03/07/2019, 9:23 AM

## 2019-03-07 NOTE — Plan of Care (Signed)
Progress note  Pt found in bed; compliant with medication administration. Pt denies any physical problems or pain, but does appear anxious and depressed. Pt provided support and encouragement. Pt denies si/hi/ah/vh and verbally agrees to approach staff if these become apparent or before harming himself/others while at [facility]. Pt given medication per protocol and standing orders. Q31m safety checks implemented and continued. Pt safe on the unit. Will continue to monitor.   Pt progressing in the following metrics  Problem: Education: Goal: Knowledge of [place] General Education information/materials will improve Outcome: Progressing Goal: Emotional status will improve Outcome: Progressing Goal: Mental status will improve Outcome: Progressing Goal: Verbalization of understanding the information provided will improve Outcome: Progressing

## 2019-03-08 MED ORDER — TRAZODONE HCL 100 MG PO TABS
100.0000 mg | ORAL_TABLET | Freq: Every evening | ORAL | Status: DC | PRN
  Administered 2019-03-08: 100 mg via ORAL
  Filled 2019-03-08: qty 1

## 2019-03-08 MED ORDER — CLONIDINE HCL 0.1 MG PO TABS
0.1000 mg | ORAL_TABLET | Freq: Two times a day (BID) | ORAL | Status: DC
  Administered 2019-03-08 – 2019-03-09 (×2): 0.1 mg via ORAL
  Filled 2019-03-08 (×6): qty 1

## 2019-03-08 NOTE — Progress Notes (Signed)
[facility] MD Progress Note  03/08/2019 11:41 AM Mark Hammond  MRN:  [id number] Subjective:   Patient is in bed does not really engage much still but he denies current thoughts of harming himself he thinks the meds are helpful reports no cravings tremors or withdrawal symptoms does however request discharge tomorrow I think that is possible but we will continue current regimen until then Principal Problem: Dependence on alcohol cocaine abuse cannabis abuse related instability Diagnosis: Active Problems:   MDD (major depressive disorder)  Total Time spent with patient: 20 minutes  Past Medical History:  Past Medical History:  Diagnosis Date  . Dental abscess    History reviewed. No pertinent surgical history. Family History:  Family History  Problem Relation Age of Onset  . Stroke Other    Family Psychiatric  History: neg Social History:  Social History   Substance and Sexual Activity  Alcohol Use Yes   Comment: Drinks 3-4 times a week. Last drink: 2 days     Social History   Substance and Sexual Activity  Drug Use Yes  . Types: Marijuana, Methamphetamines   Comment: last used meth 1 week ago    Social History   Socioeconomic History  . Marital status: Single    Spouse name: Not on file  . Number of children: Not on file  . Years of education: Not on file  . Highest education level: Not on file  Occupational History  . Not on file  Social Needs  . Financial resource strain: Not on file  . Food insecurity:    Worry: Not on file    Inability: Not on file  . Transportation needs:    Medical: Not on file    Non-medical: Not on file  Tobacco Use  . Smoking status: Current Every Day Smoker    Packs/day: 1.00    Types: Cigarettes  . Smokeless tobacco: Never Used  Substance and Sexual Activity  . Alcohol use: Yes    Comment: Drinks 3-4 times a week. Last drink: 2 days  . Drug use: Yes    Types: Marijuana, Methamphetamines    Comment: last used meth 1 week ago  . Sexual  activity: Not on file  Lifestyle  . Physical activity:    Days per week: Not on file    Minutes per session: Not on file  . Stress: Not on file  Relationships  . Social connections:    Talks on phone: Not on file    Gets together: Not on file    Attends religious service: Not on file    Active member of club or organization: Not on file    Attends meetings of clubs or organizations: Not on file    Relationship status: Not on file  Other Topics Concern  . Not on file  Social History Narrative  . Not on file   Additional Social History:                         Sleep: Good  Appetite:  Good  Current Medications: Current Facility-Administered Medications  Medication Dose Route Frequency Provider Last Rate Last Dose  . acetaminophen (TYLENOL) tablet 650 mg  650 mg Oral Q6H PRN Antonieta Pert, MD   650 mg at 03/06/19 2138  . alum & mag hydroxide-simeth (MAALOX/MYLANTA) 200-200-20 MG/5ML suspension 30 mL  30 mL Oral Q4H PRN Antonieta Pert, MD      . cariprazine (VRAYLAR) capsule 1.5 mg  1.5  mg Oral Daily Malvin Johns, MD   1.5 mg at 03/08/19 4481  . chlordiazePOXIDE (LIBRIUM) capsule 25 mg  25 mg Oral Q6H PRN Malvin Johns, MD      . chlordiazePOXIDE (LIBRIUM) capsule 25 mg  25 mg Oral Nicki Guadalajara, MD   25 mg at 03/08/19 8563   Followed by  . [START ON 03/09/2019] chlordiazePOXIDE (LIBRIUM) capsule 25 mg  25 mg Oral Daily Malvin Johns, MD      . cloNIDine (CATAPRES) tablet 0.1 mg  0.1 mg Oral BID Malvin Johns, MD      . FLUoxetine (PROZAC) capsule 20 mg  20 mg Oral Daily Malvin Johns, MD   20 mg at 03/08/19 1497  . folic acid (FOLVITE) tablet 1 mg  1 mg Oral Daily Antonieta Pert, MD   1 mg at 03/08/19 0263  . hydrOXYzine (ATARAX/VISTARIL) tablet 25 mg  25 mg Oral Q6H PRN Antonieta Pert, MD      . loperamide (IMODIUM) capsule 2-4 mg  2-4 mg Oral PRN Malvin Johns, MD      . multivitamin with minerals tablet 1 tablet  1 tablet Oral Daily Malvin Johns, MD    1 tablet at 03/08/19 [phone]  . ondansetron (ZOFRAN-ODT) disintegrating tablet 4 mg  4 mg Oral Q6H PRN Malvin Johns, MD   4 mg at 03/07/19 1755  . thiamine (B-1) injection 100 mg  100 mg Intramuscular Once Malvin Johns, MD      . thiamine (VITAMIN B-1) tablet 100 mg  100 mg Oral Daily Malvin Johns, MD   100 mg at 03/08/19 8502  . traZODone (DESYREL) tablet 50 mg  50 mg Oral QHS PRN Antonieta Pert, MD   50 mg at 03/07/19 2206    Lab Results:  Results for orders placed or performed during the hospital encounter of 03/06/19 (from the past 48 hour(s))  Hemoglobin A1c     Status: None   Collection Time: 03/07/19  6:40 PM  Result Value Ref Range   Hgb A1c MFr Bld 5.1 4.8 - 5.6 %    Comment: (NOTE) Pre diabetes:          5.7%-6.4% Diabetes:              >6.4% Glycemic control for   <7.0% adults with diabetes    Mean Plasma Glucose 99.67 mg/dL    Comment: Performed at [facility], [address], [place], [place] [address]  Lipid panel     Status: None   Collection Time: 03/07/19  6:40 PM  Result Value Ref Range   Cholesterol 131 0 - 200 mg/dL   Triglycerides 878 <676 mg/dL   HDL 66 >72 mg/dL   Total CHOL/HDL Ratio 2.0 RATIO   VLDL 24 0 - 40 mg/dL   LDL Cholesterol 41 0 - 99 mg/dL    Comment:        Total Cholesterol/HDL:CHD Risk Coronary Heart Disease Risk Table                     Men   Women  1/2 Average Risk   3.4   3.3  Average Risk       5.0   4.4  2 X Average Risk   9.6   7.1  3 X Average Risk  23.4   11.0        Use the calculated Patient Ratio above and the CHD Risk Table to determine the patient's CHD Risk.  ATP III CLASSIFICATION (LDL):  <100     mg/dL   Optimal  [phone]  mg/dL   Near or Above                    Optimal  130-159  mg/dL   Borderline  [phone]  mg/dL   High  >811>190     mg/dL   Very High Performed at [facility], [address] [place], [place] [address]   TSH     Status: None   Collection Time: 03/07/19  6:40 PM   Result Value Ref Range   TSH 0.529 0.350 - 4.500 uIU/mL    Comment: Performed by a 3rd Generation assay with a functional sensitivity of <=0.01 uIU/mL. Performed at [facility], [address] [place], [place] [address]     Blood Alcohol level:  Lab Results  Component Value Date   ETH 171 (H) 03/05/2019   ETH <10 02/22/2019    Metabolic Disorder Labs: Lab Results  Component Value Date   HGBA1C 5.1 03/07/2019   MPG 99.67 03/07/2019   No results found for: PROLACTIN Lab Results  Component Value Date   CHOL 131 03/07/2019   TRIG 119 03/07/2019   HDL 66 03/07/2019   CHOLHDL 2.0 03/07/2019   VLDL 24 03/07/2019   LDLCALC 41 03/07/2019    Physical Findings: AIMS: Facial and Oral Movements Muscles of Facial Expression: None, normal Lips and Perioral Area: None, normal Jaw: None, normal Tongue: None, normal,Extremity Movements Upper (arms, wrists, hands, fingers): None, normal Lower (legs, knees, ankles, toes): None, normal, Trunk Movements Neck, shoulders, hips: None, normal, Overall Severity Severity of abnormal movements (highest score from questions above): None, normal Incapacitation due to abnormal movements: None, normal Patient's awareness of abnormal movements (rate only patient's report): No Awareness, Dental Status Current problems with teeth and/or dentures?: Yes Does patient usually wear dentures?: No  CIWA:  CIWA-Ar Total: 1 COWS:     Musculoskeletal: Strength & Muscle Tone: within normal limits Gait & Station: normal Patient leans: N/A  Psychiatric Specialty Exam: Physical Exam  ROS  Blood pressure (!) 144/127, pulse 70, temperature 98.1 F (36.7 C), temperature source Oral, resp. rate 16, height 5\' 5"  (1.651 m), weight 61.2 kg, SpO2 100 %.Body mass index is 22.47 kg/m.  General Appearance: Disheveled  Eye Contact:  Minimal  Speech:  Slow  Volume:  Decreased  Mood:  Dysphoric  Affect:  Congruent  Thought Process:  Goal  Directed  Orientation:  Full (Time, Place, and Person)  Thought Content:  Tangential  Suicidal Thoughts:  No  Homicidal Thoughts:  No  Memory:  Immediate;   Good  Judgement:  Fair  Insight:  Fair  Psychomotor Activity:  EPS-neg  Concentration:  Concentration: Fair  Recall:  Fair  Fund of Knowledge:  Fair  Language:  Fair  Akathisia:  Negative  Handed:  Right  AIMS (if indicated):     Assets:  Physical Health Social Support  ADL's:  Intact  Cognition:  WNL  Sleep:  Number of Hours: 6     Treatment Plan Summary: Daily contact with patient to assess and evaluate symptoms and progress in treatment and Medication management continue current detox regimen probable discharge Saturday no change in meds or precautions, patient does not really engage much in cognitive therapy offers is little to work with but again no acute dangerousness  Lilton Pare, MD 03/08/2019, 11:41 AM

## 2019-03-08 NOTE — Plan of Care (Signed)
  Problem: Education: Goal: Knowledge of [facility] Education information/materials will improve Outcome: Progressing   Problem: Activity: Goal: Interest or engagement in activities will improve Outcome: Progressing   Problem: Health Behavior/Discharge Planning: Goal: Compliance with treatment plan for underlying cause of condition will improve Outcome: Progressing   Problem: Physical Regulation: Goal: Ability to maintain clinical measurements within normal limits will improve Outcome: Progressing   Problem: Safety: Goal: Periods of time without injury will increase Outcome: Progressing   
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
## 2019-03-08 NOTE — Progress Notes (Signed)
D: Pt was in dayroom upon initial approach.  Pt presents with depressed, anxious affect and mood.  Pt reports passive SI without a plan.  He verbally contracts for safety.  He reports AH, stating he is "still hearing voices, non-stop chat when I lay down."  Pt complains of jaw pain of 6/10.  Pt has been visible in milieu with few peer interactions.  Pt reports he had difficulty sleeping last night.   A: Introduced self to pt.  Met with pt 1:1.  Actively listened to pt and offered support and encouragement.  Medications offered per order.  PRN medication administered for sleep, pain, and anxiety.  Q15 minute safety checks in place.  R: Pt is safe on the unit.  Pt is compliant with medications except for Librium, which he declined to take.  Pt verbally contracts for safety.  Will continue to monitor and assess.

## 2019-03-08 NOTE — Progress Notes (Signed)
Recreation Therapy Notes  Date:  6.5.20 Time: 0930 Location: [address] Dayroom  Group Topic: Stress Management  Goal Area(s) Addresses:  Patient will identify positive stress management techniques. Patient will identify benefits of using stress management post d/c.  Intervention: Stress Management  Activity :  Meditation.  LRT introduced the stress management technique of meditation.  LRT played a meditation that guided patients on being resilient in the face of adversity.  Education:  Stress Management, Discharge Planning.   Education Outcome: Acknowledges Education  Clinical Observations/Feedback:  Pt did not attend group session.    Caroll Rancher, LRT/CTRS         Caroll Rancher A 03/08/2019 11:49 AM

## 2019-03-09 DIAGNOSIS — F329 Major depressive disorder, single episode, unspecified: Secondary | ICD-10-CM

## 2019-03-09 MED ORDER — FLUOXETINE HCL 20 MG PO CAPS: 20.0000 mg | ORAL_CAPSULE | Freq: Every day | ORAL | 0 refills | Status: DC

## 2019-03-09 MED ORDER — TRAZODONE HCL 100 MG PO TABS
100.0000 mg | ORAL_TABLET | Freq: Every evening | ORAL | Status: DC | PRN
  Filled 2019-03-09: qty 7

## 2019-03-09 MED ORDER — TRAZODONE HCL 100 MG PO TABS: 100.0000 mg | ORAL_TABLET | Freq: Every evening | ORAL | 0 refills | Status: DC | PRN

## 2019-03-09 MED ORDER — CARIPRAZINE HCL 1.5 MG PO CAPS: 1.5000 mg | ORAL_CAPSULE | Freq: Every day | ORAL | 0 refills | Status: DC

## 2019-03-09 MED ORDER — HYDROXYZINE HCL 25 MG PO TABS: 25.0000 mg | ORAL_TABLET | Freq: Four times a day (QID) | ORAL | 0 refills | Status: DC | PRN

## 2019-03-09 NOTE — [facility] Group Notes (Signed)
[facility] Group Notes:  (Nursing/MHT/Case Management/Adjunct)  Date:  03/09/2019  Time: 200 PM Type of Therapy:  Nurse Education  Participation Level:  Did Not Attend  Waymond Cera 03/09/2019, 6:12 PM

## 2019-03-09 NOTE — [facility] Group Notes (Signed)
[facility] LCSW Group Therapy Note  03/09/2019  10:00-11:00AM  Type of Therapy and Topic:  Group Therapy - Accepting We Are All Damaged People  Participation Level:  Active   Description of Group:  Patients in this group were asked to share whether they feel that they are "damaged" and explain their responses.  A song entitled "Damaged People" was then played, followed by a discussion of the relevance/relatedness of this song to each patient.   The conclusion of the group was that our goal as humans does not need to be perfection, but rather growth.  Insights among group members were shared, including that it is easy to point the fingers at others as being damaged, but actually we need to realize that we also are flawed humans with problems to overcome.  The group concluded with an emphasis on how this is ultimately a message of hope that we face struggles like every other person in the world, and that we are not alone.  Therapeutic Goals: 1)  introduce the concept of pain and hardship being universal  2)  connect emotionally to a musical message and to other group members  3)  identify the patient's current beliefs about their own broken methods of resolving their life problems to date, specifically related to this hospitalization  4)  allow time and space for patients to vent their pain and receive support from other patients  5)  elicit hope that arises from realizing we are not alone in our human struggles   Summary of Patient Progress:  The patient expressed that he does feel he is "damaged," because he relies so heavily on other people's opinions and tries to please other people.  He stated that is not going to change, and that he will continue to try to please everyone.  We briefly discussed how it is potentially unhealthy to do what everyone wants, because the things they ask could be harmful.  He talked about relying on God a great deal.  Therapeutic Modalities:   Motivational  Interviewing Activity  Maretta Los  03/09/2019 11:13 AM

## 2019-03-09 NOTE — Progress Notes (Signed)
[facility] NOVEL CORONAVIRUS (COVID-19) DAILY CHECK-OFF SYMPTOMS - answer yes or no to each - every day NO YES  Have you had a fever in the past 24 hours?  . Fever (Temp > 37.80C / 100F) X   Have you had any of these symptoms in the past 24 hours? . New Cough .  Sore Throat  .  Shortness of Breath .  Difficulty Breathing .  Unexplained Body Aches   X   Have you had any one of these symptoms in the past 24 hours not related to allergies?   . Runny Nose .  Nasal Congestion .  Sneezing   X   If you have had runny nose, nasal congestion, sneezing in the past 24 hours, has it worsened?  X   EXPOSURES - check yes or no X   Have you traveled outside the state in the past 14 days?  X   Have you been in contact with someone with a confirmed diagnosis of COVID-19 or PUI in the past 14 days without wearing appropriate PPE?  X   Have you been living in the same home as a person with confirmed diagnosis of COVID-19 or a PUI (household contact)?    X   Have you been diagnosed with COVID-19?    X              What to do next: Answered NO to all: Answered YES to anything:   Proceed with unit schedule Follow the [facility] Inpatient Flowsheet.   

## 2019-03-09 NOTE — [facility] Suicide Risk Assessment (Signed)
[facility] Discharge Suicide Risk Assessment   Principal Problem: <principal problem not specified> Discharge Diagnoses: Active Problems:   MDD (major depressive disorder)   Total Time spent with patient: 15 minutes  Musculoskeletal: Strength & Muscle Tone: within normal limits Gait & Station: normal Patient leans: N/A  Psychiatric Specialty Exam: Review of Systems  All other systems reviewed and are negative.   Blood pressure 121/78, pulse 64, temperature 98.4 F (36.9 C), temperature source Oral, resp. rate 16, height 5\' 5"  (1.651 m), weight 61.2 kg, SpO2 100 %.Body mass index is 22.47 kg/m.  General Appearance: Casual  Eye Contact::  Good  Speech:  Normal Rate409  Volume:  Normal  Mood:  Euthymic  Affect:  Congruent  Thought Process:  Coherent and Descriptions of Associations: Intact  Orientation:  Full (Time, Place, and Person)  Thought Content:  Logical  Suicidal Thoughts:  No  Homicidal Thoughts:  No  Memory:  Immediate;   Fair Recent;   Fair Remote;   Fair  Judgement:  Fair  Insight:  Fair  Psychomotor Activity:  Normal  Concentration:  Good  Recall:  [employer] of Knowledge:Fair  Language: Fair  Akathisia:  Negative  Handed:  Right  AIMS (if indicated):     Assets:  Desire for Improvement Resilience  Sleep:  Number of Hours: 6.25  Cognition: WNL  ADL's:  Intact   Mental Status Per Nursing Assessment::   On Admission:  Suicidal ideation indicated by patient  Demographic Factors:  Male, Low socioeconomic status and Unemployed  Loss Factors: NA  Historical Factors: Impulsivity  Risk Reduction Factors:   Positive coping skills or problem solving skills  Continued Clinical Symptoms:  Depression:   Comorbid alcohol abuse/dependence Impulsivity Alcohol/Substance Abuse/Dependencies  Cognitive Features That Contribute To Risk:  None    Suicide Risk:  Minimal: No identifiable suicidal ideation.  Patients presenting with no risk factors but with  morbid ruminations; may be classified as minimal risk based on the severity of the depressive symptoms  Follow-up Information    [facility] Follow up on 03/13/2019.   Why:  Hospital follow up appointment is Wednesday, 6/10 a 1:30p. Please wear a mask during your appointment.   Contact information: [address] [place] [address] [phone]           Plan Of Care/Follow-up recommendations:  Activity:  ad lib  Sharma Covert, MD 03/09/2019, 11:15 AM
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
## 2019-03-09 NOTE — Progress Notes (Signed)
Pt is adamant that he is ready to go home and " get back to my life". He completed his daily assessment and on htis he wrote he denied SI today and he rated his,depression, hopelessness and anxiety " 4/5/4/", respectively. He is given his dc instrucitons by this Probation officer, they are reviewed with him and he states verbal understanding and willingness to comply. He is given a [place] bus ticket for transportation home. He is given cc of dc instrucitons ( SRA, AVS, SSP and transition record) as welll as 30 day prescription NRF for his continuing meds and 7day supply of meds from pharmacy. Alll belonigiings inhis locker are returned to him and he si escorted to bdg entrance ambulatoryand shown how to find bus station. Pt dc'd per MD order.

## 2019-03-09 NOTE — Progress Notes (Signed)
  [facility] Adult Case Management Discharge Plan :  Will you be returning to the same living situation after discharge:  No.  Going to stay with a friend in a hotel At discharge, do you have transportation home?: No.  Bus pass provided Do you have the ability to pay for your medications: No.  Assistance needed from community organization  Release of information consent forms completed and turned in to Medical Records by CSW.   Patient to Follow up at: Follow-up Information    [facility] Follow up on 03/13/2019.   Why:  Hospital follow up appointment is Wednesday, 6/10 a 1:30p. Please wear a mask during your appointment.   Contact information: [address] [place] [address] [phone]           Next level of care provider has access to [place] and Suicide Prevention discussed: Yes,  with girlfriend  Have you used any form of tobacco in the last 30 days? (Cigarettes, Smokeless Tobacco, Cigars, and/or Pipes): No  Has patient been referred to the Quitline?: N/A patient is not a smoker  Patient has been referred for addiction treatment: Yes  Maretta Los, LCSW 03/09/2019, 11:32 AM

## 2019-03-09 NOTE — [facility] Suicide Risk Assessment (Signed)
[facility] INPATIENT:  Family/Significant Other Suicide Prevention Education  Suicide Prevention Education:  Education Completed; [place] [phone],  (name of family member/significant other) has been identified by the patient as the family member/significant other with whom the patient will be residing, and identified as the person(s) who will aid the patient in the event of a mental health crisis (suicidal ideations/suicide attempt).  With written consent from the patient, the family member/significant other has been provided the following suicide prevention education, prior to the and/or following the discharge of the patient.  Girlfriend stated she is very familiar with the information included in the typical suicide prevention education phone call, because she has gone through these situations herself and has gone through them with patient in the last 2 years they have been together.  She interrupted several times to say she already knew this.  CSW emphasized the information about proactively asking patient if he is feeling like hurting himself in the even that she starts to see warning signs again.  She stated patient will not be living with her at discharge, but will be coming to get his possessions.  The suicide prevention education provided includes the following:  Suicide risk factors  Suicide prevention and interventions  National Suicide Hotline telephone number  [facility] assessment telephone number  [facility] Emergency Assistance [place] and/or Residential Mobile Crisis Unit telephone number  Request made of family/significant other to:  Remove weapons (e.g., guns, rifles, knives), all items previously/currently identified as safety concern.    Remove drugs/medications (over-the-counter, prescriptions, illicit drugs), all items previously/currently identified as a safety concern.  The family member/significant other verbalizes understanding  of the suicide prevention education information provided.  The family member/significant other agrees to remove the items of safety concern listed above.  Berlin Hun Grossman-Orr 03/09/2019, 11:27 AM

## 2019-03-11 ENCOUNTER — Encounter (HOSPITAL_COMMUNITY): Payer: Self-pay | Admitting: Emergency Medicine

## 2019-03-11 ENCOUNTER — Emergency Department (HOSPITAL_COMMUNITY)
Admission: EM | Admit: 2019-03-11 | Discharge: 2019-03-11 | Disposition: A | Discharge status: Home / Self Care | Payer: Self-pay | Attending: Emergency Medicine | Admitting: Emergency Medicine

## 2019-03-11 ENCOUNTER — Emergency Department (HOSPITAL_COMMUNITY): Admission: EM | Admit: 2019-03-11 | Discharge: 2019-03-11 | Discharge status: Against Medical Advice | Payer: Self-pay

## 2019-03-11 ENCOUNTER — Other Ambulatory Visit: Payer: Self-pay

## 2019-03-11 DIAGNOSIS — F17210 Nicotine dependence, cigarettes, uncomplicated: Secondary | ICD-10-CM | POA: Insufficient documentation

## 2019-03-11 DIAGNOSIS — Z760 Encounter for issue of repeat prescription: Secondary | ICD-10-CM | POA: Insufficient documentation

## 2019-03-11 MED ORDER — CARIPRAZINE HCL 1.5 MG PO CAPS: 1.5000 mg | ORAL_CAPSULE | Freq: Every day | ORAL | 0 refills | Status: DC

## 2019-03-11 MED ORDER — FLUOXETINE HCL 20 MG PO CAPS: 20.0000 mg | ORAL_CAPSULE | Freq: Every day | ORAL | 0 refills | Status: DC

## 2019-03-11 MED ORDER — TRAZODONE HCL 100 MG PO TABS: 100.0000 mg | ORAL_TABLET | Freq: Every evening | ORAL | 0 refills | Status: DC | PRN

## 2019-03-11 MED ORDER — HYDROXYZINE HCL 25 MG PO TABS: 25.0000 mg | ORAL_TABLET | Freq: Four times a day (QID) | ORAL | 0 refills | Status: DC | PRN

## 2019-03-11 NOTE — ED Notes (Signed)
Bed: [facility] Expected date:  Expected time:  Means of arrival:  Comments: 

## 2019-03-11 NOTE — ED Notes (Signed)
Writer called pt to be triaged without response.  Writer noticed pt is laying across chair sleeping in lobby.  Writer went to lobby to try to wake patient to bring him into triage.  Pt answered when tapped on foot and name called, but pt did not move.  RN notified.
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
## 2019-03-11 NOTE — Discharge Instructions (Addendum)
Please pick up your prescriptions at the pharmacy listed on your discharge paperwork.   Please follow up with [facility].   Return to the ER for new or worsening symptoms.

## 2019-03-11 NOTE — ED Triage Notes (Signed)
Pt presents for medication refill but would not disclose what medication needed refilling.

## 2019-03-18 ENCOUNTER — Emergency Department (HOSPITAL_COMMUNITY)
Admission: EM | Admit: 2019-03-18 | Discharge: 2019-03-18 | Disposition: A | Discharge status: Home / Self Care | Payer: Self-pay | Attending: Emergency Medicine | Admitting: Emergency Medicine

## 2019-03-18 ENCOUNTER — Encounter (HOSPITAL_COMMUNITY): Payer: Self-pay

## 2019-03-18 ENCOUNTER — Other Ambulatory Visit: Payer: Self-pay

## 2019-03-18 DIAGNOSIS — F250 Schizoaffective disorder, bipolar type: Secondary | ICD-10-CM | POA: Insufficient documentation

## 2019-03-18 DIAGNOSIS — F10929 Alcohol use, unspecified with intoxication, unspecified: Secondary | ICD-10-CM | POA: Insufficient documentation

## 2019-03-18 DIAGNOSIS — F1310 Sedative, hypnotic or anxiolytic abuse, uncomplicated: Secondary | ICD-10-CM | POA: Diagnosis present

## 2019-03-18 DIAGNOSIS — Y906 Blood alcohol level of 120-199 mg/100 ml: Secondary | ICD-10-CM | POA: Insufficient documentation

## 2019-03-18 DIAGNOSIS — E876 Hypokalemia: Secondary | ICD-10-CM | POA: Insufficient documentation

## 2019-03-18 DIAGNOSIS — R440 Auditory hallucinations: Secondary | ICD-10-CM

## 2019-03-18 DIAGNOSIS — F1419 Cocaine abuse with unspecified cocaine-induced disorder: Secondary | ICD-10-CM | POA: Insufficient documentation

## 2019-03-18 HISTORY — DX: Schizoaffective disorder, unspecified: F25.9

## 2019-03-18 HISTORY — DX: Depression, unspecified: F32.A

## 2019-03-18 HISTORY — DX: Bipolar disorder, unspecified: F31.9

## 2019-03-18 LAB — COMPREHENSIVE METABOLIC PANEL
ALT: 32 U/L (ref 0–44)
AST: 47 U/L — ABNORMAL HIGH (ref 15–41)
Albumin: 3.7 g/dL (ref 3.5–5.0)
Alkaline Phosphatase: 86 U/L (ref 38–126)
Anion gap: 11 (ref 5–15)
BUN: 6 mg/dL (ref 6–20)
CO2: 24 mmol/L (ref 22–32)
Calcium: 8.6 mg/dL — ABNORMAL LOW (ref 8.9–10.3)
Chloride: 106 mmol/L (ref 98–111)
Creatinine, Ser: 0.78 mg/dL (ref 0.61–1.24)
GFR calc Af Amer: >60 mL/min (ref >60)
GFR calc non Af Amer: >60 mL/min (ref >60)
Glucose, Bld: 125 mg/dL — ABNORMAL HIGH (ref 70–99)
Potassium: 3 mmol/L — ABNORMAL LOW (ref 3.5–5.1)
Sodium: 141 mmol/L (ref 135–145)
Total Bilirubin: 0.4 mg/dL (ref 0.3–1.2)
Total Protein: 7 g/dL (ref 6.5–8.1)

## 2019-03-18 LAB — RAPID URINE DRUG SCREEN, HOSP PERFORMED
Amphetamines: NOT DETECTED
Barbiturates: NOT DETECTED
Benzodiazepines: POSITIVE — AB
Cocaine: POSITIVE — AB
Opiates: NOT DETECTED
Tetrahydrocannabinol: NOT DETECTED

## 2019-03-18 LAB — CBC WITH DIFFERENTIAL/PLATELET
Abs Immature Granulocytes: 0.04 10*3/uL (ref 0.00–0.07)
Basophils Absolute: 0.1 10*3/uL (ref 0.0–0.1)
Basophils Relative: 1 %
Eosinophils Absolute: 0.1 10*3/uL (ref 0.0–0.5)
Eosinophils Relative: 1 %
HCT: 44.3 % (ref 39.0–52.0)
Hemoglobin: 15.1 g/dL (ref 13.0–17.0)
Immature Granulocytes: 0 %
Lymphocytes Relative: 27 %
Lymphs Abs: 2.8 10*3/uL (ref 0.7–4.0)
MCH: 32 pg (ref 26.0–34.0)
MCHC: 34.1 g/dL (ref 30.0–36.0)
MCV: 93.9 fL (ref 80.0–100.0)
Monocytes Absolute: 0.7 10*3/uL (ref 0.1–1.0)
Monocytes Relative: 7 %
Neutro Abs: 6.6 10*3/uL (ref 1.7–7.7)
Neutrophils Relative %: 64 %
Platelets: 296 10*3/uL (ref 150–400)
RBC: 4.72 MIL/uL (ref 4.22–5.81)
RDW: 13.1 % (ref 11.5–15.5)
WBC: 10.3 10*3/uL (ref 4.0–10.5)
nRBC: 0 % (ref 0.0–0.2)

## 2019-03-18 LAB — ACETAMINOPHEN LEVEL: Acetaminophen (Tylenol), Serum: <10 ug/mL — ABNORMAL LOW (ref 10–30)

## 2019-03-18 LAB — LIPASE, BLOOD: Lipase: 29 U/L (ref 11–51)

## 2019-03-18 LAB — ETHANOL: Alcohol, Ethyl (B): 181 mg/dL — ABNORMAL HIGH (ref <10)

## 2019-03-18 MED ORDER — DIPHENHYDRAMINE HCL 50 MG/ML IJ SOLN
50.0000 mg | Freq: Once | INTRAMUSCULAR | Status: AC
  Administered 2019-03-18: 50 mg via INTRAMUSCULAR
  Filled 2019-03-18: qty 1

## 2019-03-18 MED ORDER — STERILE WATER FOR INJECTION IJ SOLN
INTRAMUSCULAR | Status: AC
  Administered 2019-03-18: 02:00:00
  Filled 2019-03-18: qty 10

## 2019-03-18 MED ORDER — ACETAMINOPHEN 325 MG PO TABS: 650.0000 mg | ORAL_TABLET | ORAL | Status: DC | PRN

## 2019-03-18 MED ORDER — NICOTINE 21 MG/24HR TD PT24
21.0000 mg | MEDICATED_PATCH | Freq: Every day | TRANSDERMAL | Status: DC
  Administered 2019-03-18: 21 mg via TRANSDERMAL
  Filled 2019-03-18: qty 1

## 2019-03-18 MED ORDER — CARIPRAZINE HCL 1.5 MG PO CAPS
1.5000 mg | ORAL_CAPSULE | Freq: Every day | ORAL | Status: DC
  Administered 2019-03-18: 1.5 mg via ORAL
  Filled 2019-03-18: qty 1

## 2019-03-18 MED ORDER — ZIPRASIDONE MESYLATE 20 MG IM SOLR
20.0000 mg | Freq: Once | INTRAMUSCULAR | Status: AC
  Administered 2019-03-18: 20 mg via INTRAMUSCULAR
  Filled 2019-03-18: qty 20

## 2019-03-18 MED ORDER — FLUOXETINE HCL 20 MG PO CAPS
20.0000 mg | ORAL_CAPSULE | Freq: Every day | ORAL | Status: DC
  Administered 2019-03-18: 20 mg via ORAL
  Filled 2019-03-18: qty 1

## 2019-03-18 MED ORDER — TRAZODONE HCL 100 MG PO TABS: 100.0000 mg | ORAL_TABLET | Freq: Every evening | ORAL | Status: DC | PRN

## 2019-03-18 MED ORDER — ALUM & MAG HYDROXIDE-SIMETH 200-200-20 MG/5ML PO SUSP: 30.0000 mL | Freq: Four times a day (QID) | ORAL | Status: DC | PRN

## 2019-03-18 MED ORDER — POTASSIUM CHLORIDE CRYS ER 20 MEQ PO TBCR
40.0000 meq | EXTENDED_RELEASE_TABLET | Freq: Every day | ORAL | Status: DC
  Administered 2019-03-18: 40 meq via ORAL
  Filled 2019-03-18: qty 2

## 2019-03-18 MED ORDER — LORAZEPAM 2 MG/ML IJ SOLN
2.0000 mg | Freq: Once | INTRAMUSCULAR | Status: AC
  Administered 2019-03-18: 2 mg via INTRAMUSCULAR
  Filled 2019-03-18: qty 1

## 2019-03-18 MED ORDER — HYDROXYZINE HCL 25 MG PO TABS: 25.0000 mg | ORAL_TABLET | Freq: Four times a day (QID) | ORAL | Status: DC | PRN

## 2019-03-18 NOTE — ED Notes (Signed)
Pt ambulatory without difficulty at discharge 

## 2019-03-18 NOTE — ED Notes (Signed)
Pt asleep at this time

## 2019-03-18 NOTE — BH Assessment (Signed)
Received TTS consult request. Per RN, Pt received medication and is currently unable to participate in assessment. [facility] will call TTS at [phone] when Pt is ready.   Evelena Peat, [facility], [facility], [facility] Triage Specialist [phone]
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
## 2019-03-18 NOTE — ED Notes (Signed)
When asked how the pt was feeling he responded, "like crap, they're still talking to me."

## 2019-03-18 NOTE — BH Assessment (Signed)
[facility] Assessment Progress Note  Per Jacqueline Norman, DO, this pt does not require psychiatric hospitalization at this time.  [facility] is [facility] be discharged from [facility] with recommendation to follow up with [facility].  This has been included in pt's discharge instructions.  Pt's nurse has been notified.  Xena Propst, MA Triage Specialist [phone]     
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
## 2019-03-18 NOTE — Discharge Instructions (Signed)
For your mental health needs, you are advised to follow up with [facility].  Call them at your earliest opportunity to schedule an intake appointment: ° °     [facility] °     [address] °     [place], [place] [address] °     [phone] °     Crisis number: [phone] °

## 2019-03-18 NOTE — Patient Outreach (Signed)
ED Peer Support Specialist Patient Intake (Complete at intake & 30-60 Day Follow-up)  Name: Mark Hammond  MRN: [id number]  Age: 28 y.o.   Date of Admission: 03/18/2019  Intake: Initial Comments:      Primary Reason Admitted:Alcoholic intoxication with complication ([place])   Lab values: Alcohol/ETOH: Positive Positive UDS? No Amphetamines: No Barbiturates: No Benzodiazepines: No Cocaine: No Opiates: No Cannabinoids: No  Demographic information: Gender: Male Ethnicity: Other (comment)([place]) Marital Status: Single Insurance Status: Uninsured/Self-pay Ecologist (Work Neurosurgeon, Physicist, medical, etc.: No Lives with: Alone Living situation: Homeless  Reported Patient History: Patient reported health conditions: Depression, Bipolar disorder Patient aware of HIV and hepatitis status: No  In past year, has patient visited ED for any reason? No  Number of ED visits:    Reason(s) for visit:    In past year, has patient been hospitalized for any reason? No  Number of hospitalizations:    Reason(s) for hospitalization:    In past year, has patient been arrested? Yes  Number of arrests:    Reason(s) for arrest: distruction of property  In past year, has patient been incarcerated? No  Number of incarcerations:    Reason(s) for incarceration:    In past year, has patient received medication-assisted treatment? No  In past year, patient received the following treatments: Other (comment)([facility])  In past year, has patient received any harm reduction services? No  Did this include any of the following?    In past year, has patient received care from a mental health provider for diagnosis other than SUD? No  In past year, is this first time patient has overdosed? No  Number of past overdoses:    In past year, is this first time patient has been hospitalized for an overdose? No  Number of hospitalizations for overdose(s):     Is patient currently receiving treatment for a mental health diagnosis? No  Patient reports experiencing difficulty participating in SUD treatment: No    Most important reason(s) for this difficulty?    Has patient received prior services for treatment? No  In past, patient has received services from following agencies:    Plan of Care:  Suggested follow up at these agencies/treatment centers:    Other information: CPSS met with Pt and was made aware that Pt wants to go to [facility]. CPSS was made aware that Pt has been to [facility] previously an that he feels that it was useful for him. Pt stated that he wants to get stable and be able to stop drinking. CPSS talked with Pt about attending a treatment facility and attending AA groups Pt stated that he wants to be placed in [facility].      Aaron Edelman [place] Hippert, [place]  03/18/2019 12:42 PM
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
## 2019-03-18 NOTE — ED Notes (Addendum)
Pt's shoulders have a rash that is red and bumpy.  Pt states he has slept on concrete the past few nights, and not in any grassy or wooded areas.  MD made aware

## 2019-04-02 ENCOUNTER — Other Ambulatory Visit: Payer: Self-pay

## 2019-04-02 ENCOUNTER — Emergency Department (HOSPITAL_COMMUNITY)
Admission: EM | Admit: 2019-04-02 | Discharge: 2019-04-04 | Disposition: A | Discharge status: Home / Self Care | Payer: Self-pay | Attending: Emergency Medicine | Admitting: Emergency Medicine

## 2019-04-02 ENCOUNTER — Emergency Department (HOSPITAL_COMMUNITY): Payer: Self-pay

## 2019-04-02 ENCOUNTER — Encounter (HOSPITAL_COMMUNITY): Payer: Self-pay

## 2019-04-02 DIAGNOSIS — F251 Schizoaffective disorder, depressive type: Secondary | ICD-10-CM | POA: Insufficient documentation

## 2019-04-02 DIAGNOSIS — F17210 Nicotine dependence, cigarettes, uncomplicated: Secondary | ICD-10-CM | POA: Insufficient documentation

## 2019-04-02 DIAGNOSIS — R45851 Suicidal ideations: Secondary | ICD-10-CM | POA: Insufficient documentation

## 2019-04-02 DIAGNOSIS — Z20828 Contact with and (suspected) exposure to other viral communicable diseases: Secondary | ICD-10-CM | POA: Insufficient documentation

## 2019-04-02 LAB — COMPREHENSIVE METABOLIC PANEL
ALT: 32 U/L (ref 0–44)
AST: 43 U/L — ABNORMAL HIGH (ref 15–41)
Albumin: 3.2 g/dL — ABNORMAL LOW (ref 3.5–5.0)
Alkaline Phosphatase: 73 U/L (ref 38–126)
Anion gap: 11 (ref 5–15)
BUN: 6 mg/dL (ref 6–20)
CO2: 25 mmol/L (ref 22–32)
Calcium: 7.9 mg/dL — ABNORMAL LOW (ref 8.9–10.3)
Chloride: 108 mmol/L (ref 98–111)
Creatinine, Ser: 0.81 mg/dL (ref 0.61–1.24)
GFR calc Af Amer: >60 mL/min (ref >60)
GFR calc non Af Amer: >60 mL/min (ref >60)
Glucose, Bld: 104 mg/dL — ABNORMAL HIGH (ref 70–99)
Potassium: 3.4 mmol/L — ABNORMAL LOW (ref 3.5–5.1)
Sodium: 144 mmol/L (ref 135–145)
Total Bilirubin: 0.6 mg/dL (ref 0.3–1.2)
Total Protein: 6 g/dL — ABNORMAL LOW (ref 6.5–8.1)

## 2019-04-02 LAB — CBC
HCT: 43.3 % (ref 39.0–52.0)
Hemoglobin: 14.5 g/dL (ref 13.0–17.0)
MCH: 31.3 pg (ref 26.0–34.0)
MCHC: 33.5 g/dL (ref 30.0–36.0)
MCV: 93.5 fL (ref 80.0–100.0)
Platelets: 262 10*3/uL (ref 150–400)
RBC: 4.63 MIL/uL (ref 4.22–5.81)
RDW: 13.9 % (ref 11.5–15.5)
WBC: 6.7 10*3/uL (ref 4.0–10.5)
nRBC: 0 % (ref 0.0–0.2)

## 2019-04-02 LAB — ACETAMINOPHEN LEVEL: Acetaminophen (Tylenol), Serum: <10 ug/mL — ABNORMAL LOW (ref 10–30)

## 2019-04-02 LAB — ETHANOL: Alcohol, Ethyl (B): 231 mg/dL — ABNORMAL HIGH (ref <10)

## 2019-04-02 LAB — RAPID URINE DRUG SCREEN, HOSP PERFORMED
Amphetamines: NOT DETECTED
Barbiturates: NOT DETECTED
Benzodiazepines: POSITIVE — AB
Cocaine: POSITIVE — AB
Opiates: NOT DETECTED
Tetrahydrocannabinol: POSITIVE — AB

## 2019-04-02 LAB — SALICYLATE LEVEL: Salicylate Lvl: <7 mg/dL (ref 2.8–30.0)
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
## 2019-04-02 NOTE — ED Notes (Signed)
IVC paperwork - Copy faxed to [facility] - Copy sent to Medical Records - ALL 3 sets on clipboard.

## 2019-04-02 NOTE — ED Notes (Signed)
Pt wanded and cleared by security.  
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
## 2019-04-02 NOTE — ED Notes (Signed)
Snack provided

## 2019-04-02 NOTE — ED Notes (Signed)
Offered pt snack, declined by pt 

## 2019-04-02 NOTE — ED Notes (Signed)
Ordered bfast 

## 2019-04-02 NOTE — ED Provider Notes (Signed)
[place] EMERGENCY DEPARTMENT Provider Note   CSN: [id number] Arrival date & time: 04/02/19  0050     History   Chief Complaint Chief Complaint  Patient presents with  . Suicidal    HPI Mark Hammond is a 28 y.o. male.     Patient with past medical history remarkable for bipolar, schizoaffective, presents to the emergency department after having been sedated by EMS and brought in by [facility] for banging his head on the ground and on the police car as well as gesturing like he was going to slit his throat.  Level 5 caveat applies secondary to psychiatric condition.  Given versed and haldol by EMS.  The history is provided by the patient. No language interpreter was used.    Past Medical History:  Diagnosis Date  . Bipolar 1 disorder ([place])   . Dental abscess   . Depression   . Schizo affective schizophrenia [facility])     Patient Active Problem List   Diagnosis Date Noted  . 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 No pertinent surgical history.      Home Medications    Prior to Admission medications   Medication Sig Start Date End Date Taking? Authorizing Provider  cariprazine (VRAYLAR) capsule Take 1 capsule (1.5 mg total) by mouth daily. Patient not taking: Reported on 03/18/2019 03/11/19   Couture, Cortni S, PA-C  FLUoxetine (PROZAC) 20 MG capsule Take 1 capsule (20 mg total) by mouth daily. Patient not taking: Reported on 03/18/2019 03/11/19   Couture, Cortni S, PA-C  hydrOXYzine (ATARAX/VISTARIL) 25 MG tablet Take 1 tablet (25 mg total) by mouth every 6 (six) hours as needed for anxiety. Patient not taking: Reported on 03/18/2019 03/11/19   Couture, Cortni S, PA-C  traZODone (DESYREL) 100 MG tablet Take 1 tablet (100 mg total) by mouth at bedtime as needed and may repeat dose one time if needed for sleep. Patient not taking: Reported on 03/18/2019 03/11/19   Couture, Cortni S, PA-C    Family History Family History   Problem Relation Age of Onset  . Stroke Other     Social History Social History   Tobacco Use  . Smoking status: Current Every Day Smoker    Packs/day: 1.00    Types: Cigarettes  . Smokeless tobacco: Never Used  Substance Use Topics  . Alcohol use: Yes    Comment: Drinks 3-4 times a week. Last drink: 2 days  . Drug use: Yes    Types: Marijuana, Methamphetamines    Comment: last used meth 1 week ago     Allergies   Codeine   Review of Systems Review of Systems  Unable to perform ROS: Psychiatric disorder     Physical Exam Updated Vital Signs BP (!) 100/58   Pulse 83   Temp 98.8 F (37.1 C) (Oral)   Resp 15   SpO2 98%   Physical Exam Vitals signs and nursing note reviewed.  Constitutional:      Appearance: He is well-developed.  HENT:     Head: Normocephalic and atraumatic.  Eyes:     Conjunctiva/sclera: Conjunctivae normal.  Neck:     Musculoskeletal: Neck supple.  Cardiovascular:     Rate and Rhythm: Normal rate and regular rhythm.     Heart sounds: No murmur.  Pulmonary:     Effort: Pulmonary effort is normal. No respiratory distress.     Breath sounds: Normal breath sounds.  Abdominal:     Palpations:  Abdomen is soft.     Tenderness: There is no abdominal tenderness.  Skin:    General: Skin is warm and dry.  Neurological:     Comments: Unable to assess, sedated  Psychiatric:     Comments: Unable to assess, sedated      ED Treatments / Results  Labs (all labs ordered are listed, but only abnormal results are displayed) Labs Reviewed  RAPID URINE DRUG SCREEN, HOSP PERFORMED - Abnormal; Notable for the following components:      Result Value   Cocaine POSITIVE (*)    Benzodiazepines POSITIVE (*)    Tetrahydrocannabinol POSITIVE (*)    All other components within normal limits  ETHANOL - Abnormal; Notable for the following components:   Alcohol, Ethyl (B) 231 (*)    All other components within normal limits  ACETAMINOPHEN LEVEL -  Abnormal; Notable for the following components:   Acetaminophen (Tylenol), Serum <10 (*)    All other components within normal limits  COMPREHENSIVE METABOLIC PANEL - Abnormal; Notable for the following components:   Potassium 3.4 (*)    Glucose, Bld 104 (*)    Calcium 7.9 (*)    Total Protein 6.0 (*)    Albumin 3.2 (*)    AST 43 (*)    All other components within normal limits  SALICYLATE LEVEL  CBC    EKG    Radiology Ct Head Wo Contrast  Result Date: 04/02/2019 CLINICAL DATA:  28 y/o M; Head trauma, minor, GCS>=13, low clinical risk, initial exam. EXAM: CT HEAD WITHOUT CONTRAST TECHNIQUE: Contiguous axial images were obtained from the base of the skull through the vertex without intravenous contrast. COMPARISON:  None. FINDINGS: Brain: No evidence of acute infarction, hemorrhage, hydrocephalus, extra-axial collection or mass lesion/mass effect. Vascular: No hyperdense vessel or unexpected calcification. Skull: Chronic fractures of the nasal bones with mild leftward displacement. No calvarial fracture Sinuses/Orbits: No acute finding. Other: None. IMPRESSION: Negative CT of the head. Electronically Signed   By: Mitzi HansenLance  Furusawa-Stratton M.D.   On: 04/02/2019 02:15    Procedures Procedures (including critical care time)  Medications Ordered in ED Medications - No data to display   Initial Impression / Assessment and Plan / ED Course  I have reviewed the triage vital signs and the nursing notes.  Pertinent labs & imaging results that were available during my care of the patient were reviewed by me and considered in my medical decision making (see chart for details).       Patient brought in by [employer].  Found hitting head on ground and police care.  Gesturing that he was going to slit his throat.  Given Versed and Haldol by EMS.  ETOH on board.    TTS consult pending.  Final Clinical Impressions(s) / ED Diagnoses   Final diagnoses:  None    ED Discharge Orders    None        Roxy HorsemanBrowning, Yousuf Ager, PA-C 04/02/19 0604    Ward, Layla MawKristen N, DO 04/02/19 [id number]

## 2019-04-02 NOTE — ED Notes (Signed)
Pt taking po fluids and tolerating well. Walking in room. Calm and cooperative but states. The schizophrenia is messig with me.Endorses hearing and seeing "things".

## 2019-04-02 NOTE — ED Notes (Signed)
Dinner tray ordered.

## 2019-04-02 NOTE — ED Notes (Signed)
Pt woke up, ambulated to the bathroom.  Calm and cooperative at this time.

## 2019-04-02 NOTE — Progress Notes (Signed)
Pt meets inpatient criteria per Mordecai Maes, NP. Referral information has been sent to the following hospitals for review:  [place]   [facility]  [place]  Disposition will continue to follow for inpatient placement needs.   Audree Camel, LCSW, [place] Disposition [facility]/TTS [phone]

## 2019-04-02 NOTE — ED Notes (Signed)
Called for an officer to come serve papers

## 2019-04-03 LAB — SARS CORONAVIRUS 2 BY RT PCR (HOSPITAL ORDER, PERFORMED IN ~~LOC~~ HOSPITAL LAB): SARS Coronavirus 2: NEGATIVE

## 2019-04-03 NOTE — ED Notes (Signed)
Diet was ordered for Lunch. 

## 2019-04-03 NOTE — ED Provider Notes (Signed)
Pt sleeping.  Rn and sitter report no concerns.    [name], [place] 04/03/19 1120    [name], MD 04/04/19 [phone]

## 2019-04-03 NOTE — ED Notes (Signed)
Patient given a snack and drink. 

## 2019-04-03 NOTE — ED Notes (Signed)
Patient given lunch tray and is going to take a shower

## 2019-04-03 NOTE — ED Notes (Signed)
Pt now had 2 phone calls today.

## 2019-04-04 ENCOUNTER — Other Ambulatory Visit: Payer: Self-pay

## 2019-04-04 DIAGNOSIS — R440 Auditory hallucinations: Secondary | ICD-10-CM

## 2019-04-04 DIAGNOSIS — R45851 Suicidal ideations: Secondary | ICD-10-CM

## 2019-04-04 DIAGNOSIS — F19188 Other psychoactive substance abuse with other psychoactive substance-induced disorder: Secondary | ICD-10-CM

## 2019-04-04 DIAGNOSIS — R441 Visual hallucinations: Secondary | ICD-10-CM

## 2019-04-04 MED ORDER — HYDROXYZINE HCL 25 MG PO TABS: 25.0000 mg | ORAL_TABLET | Freq: Four times a day (QID) | ORAL | 0 refills | Status: DC | PRN

## 2019-04-04 MED ORDER — CARIPRAZINE HCL 1.5 MG PO CAPS: 1.5000 mg | ORAL_CAPSULE | Freq: Every day | ORAL | 0 refills | Status: DC

## 2019-04-04 MED ORDER — FLUOXETINE HCL 20 MG PO CAPS: 20.0000 mg | ORAL_CAPSULE | Freq: Every day | ORAL | 0 refills | Status: DC

## 2019-04-04 MED ORDER — TRAZODONE HCL 100 MG PO TABS: 100.0000 mg | ORAL_TABLET | Freq: Every evening | ORAL | 0 refills | Status: DC | PRN

## 2019-04-04 NOTE — Discharge Instructions (Signed)
Resume taking your medications as prescribed.  Please follow-up with [facility] for further evaluation and management of your mental health.  Please return to the emergency department if you develop any new or worsening symptoms including active suicidal thoughts.

## 2019-04-04 NOTE — ED Notes (Signed)
bfast ordered 

## 2019-04-04 NOTE — ED Notes (Signed)
Re-TTS being performed.  

## 2019-04-04 NOTE — Consult Note (Signed)
Telepsych Consultation   Reason for Consult:  SI and AVH Referring Physician:  EDP  [facility]  [phone] [facility]  Patient Identification: Mark Hammond MRN:  [id number] Principal Diagnosis: <principal problem not specified> Diagnosis:  Active Problems:   * No active hospital problems. *   Total Time spent with patient: 15 minutes  Subjective: " I feel much better now."   HPI: Mark Rudddam Parekh is a 28 y.o. male patient requiring psychiatric consultation following reports of SI and AVH per chart review. Patient was taken to the ED after being found at the corner store by police. Per chart review, police found him beating his head against the wall & burning self with cigarette to chest. Patient states he vaguely remember the incident. He did acknowledge that he was having SI and experiencing AVH. [facility]. He does have a history of alcohol, marijuana, and methamphetamine abuse per chart review. He admits to abusing these substances and his UDS was positive for cocaine and THC on 04/02/2019. During this evaluation, patient is alert and oriented x3, calm and cooperative. He denies SI, HI or AVH. He states that he did not picked up his discharge medications [facility] so he has been off his medications for several weeks. He was discharged on Vraylar 1.5mg  and Prozac 20 mg without adverse effects and I discussed the need to re-start these medications. He has agreed although stated he needed a prescription. I spoke with ED provider Wilford CornerAlexander Law and recommended a prescription for his home medications be provided. Patient states [facility] and I have encouraged this follow-up/ At this time, patient is being psychiatrically cleared.      Past Psychiatric History: Bipolar, depression, schizoaffective disorder, polysubstance abuse.   Risk to Self: Suicidal Ideation: Yes-Currently  Present Suicidal Intent: Yes-Currently Present("Failed attempts") Is patient at risk for suicide?: Yes Suicidal Plan?: Yes-Currently Present Specify Current Suicidal Plan: cut myself Access to Means: Yes What has been your use of drugs/alcohol within the last 12 months?: daily How many times?: 2 Other Self Harm Risks: f support, homeless Triggers for Past Attempts: None known Intentional Self Injurious Behavior: Cutting, Burning, Bruising Risk to Others: Homicidal Ideation: No Thoughts of Harm to Others: No-Not Currently Present/Within Last 6 Months Current Homicidal Intent: No Current Homicidal Plan: No Access to Homicidal Means: No Identified Victim: none History of harm to others?: No Assessment of Violence: None Noted Violent Behavior Description: none noted Does patient have access to weapons?: No Criminal Charges Pending?: Yes Describe Pending Criminal Charges: (destruction of property & tresspassing) Does patient have a court date: Yes Court Date: 04/26/19 Prior Inpatient Therapy: Prior Inpatient Therapy: Yes Prior Therapy Dates: 03/2019 [facility] Reason for Treatment: mental health Prior Outpatient Therapy: Prior Outpatient Therapy: No Does patient have an ACCT team?: No Does patient have Intensive In-House Services?  : No Does patient have [facility] services? : No Does patient have P4CC services?: No  Past Medical History:  Past Medical History:  Diagnosis Date  . Bipolar 1 disorder (HCC)   . Dental abscess   . Depression   . Schizo affective schizophrenia (HCC)    History reviewed. No pertinent surgical history. Family History:  Family History  Problem Relation Age of Onset  . Stroke Other    Family Psychiatric  History: States depression runs in the family Social History:  Social History   Substance and  Sexual Activity  Alcohol Use Yes   Comment: Drinks 3-4 times a week. Last drink: 2 days     Social History   Substance  and Sexual Activity  Drug Use Yes  . Types: Marijuana, Methamphetamines   Comment: last used meth 1 week ago    Social History   Socioeconomic History  . Marital status: Single    Spouse name: Not on file  . Number of children: Not on file  . Years of education: Not on file  . Highest education level: Not on file  Occupational History  . Not on file  Social Needs  . Financial resource strain: Not on file  . Food insecurity    Worry: Not on file    Inability: Not on file  . Transportation needs    Medical: Not on file    Non-medical: Not on file  Tobacco Use  . Smoking status: Current Every Day Smoker    Packs/day: 1.00    Types: Cigarettes  . Smokeless tobacco: Never Used  Substance and Sexual Activity  . Alcohol use: Yes    Comment: Drinks 3-4 times a week. Last drink: 2 days  . Drug use: Yes    Types: Marijuana, Methamphetamines    Comment: last used meth 1 week ago  . Sexual activity: Not on file  Lifestyle  . Physical activity    Days per week: Not on file    Minutes per session: Not on file  . Stress: Not on file  Relationships  . Social Musicianconnections    Talks on phone: Not on file    Gets together: Not on file    Attends religious service: Not on file    Active member of club or organization: Not on file    Attends meetings of clubs or organizations: Not on file    Relationship status: Not on file  Other Topics Concern  . Not on file  Social History Narrative  . Not on file   Additional Social History:    Allergies:   Allergies  Allergen Reactions  . Codeine Other (See Comments)    Patient states parents told him he was allergic at a young age.    Labs:  Results for orders placed or performed during the hospital encounter of 04/02/19 (from the past 48 hour(s))  SARS Coronavirus 2 (CEPHEID - Performed in [facility] hospital lab), Hosp Order     Status: None   Collection Time: 04/02/19 11:54 PM   Specimen: Nasopharyngeal Swab  Result Value Ref  Range   SARS Coronavirus 2 NEGATIVE NEGATIVE    Comment: (NOTE) If result is NEGATIVE SARS-CoV-2 target nucleic acids are NOT DETECTED. The SARS-CoV-2 RNA is generally detectable in upper and lower  respiratory specimens during the acute phase of infection. The lowest  concentration of SARS-CoV-2 viral copies this assay can detect is 250  copies / mL. A negative result does not preclude SARS-CoV-2 infection  and should not be used as the sole basis for treatment or other  patient management decisions.  A negative result may occur with  improper specimen collection / handling, submission of specimen other  than nasopharyngeal swab, presence of viral mutation(s) within the  areas targeted by this assay, and inadequate number of viral copies  (<250 copies / mL). A negative result must be combined with clinical  observations, patient history, and epidemiological information. If result is POSITIVE SARS-CoV-2 target nucleic acids are DETECTED. The SARS-CoV-2 RNA is generally detectable in upper and  lower  respiratory specimens dur ing the acute phase of infection.  Positive  results are indicative of active infection with SARS-CoV-2.  Clinical  correlation with patient history and other diagnostic information is  necessary to determine patient infection status.  Positive results do  not rule out bacterial infection or co-infection with other viruses. If result is PRESUMPTIVE POSTIVE SARS-CoV-2 nucleic acids MAY BE PRESENT.   A presumptive positive result was obtained on the submitted specimen  and confirmed on repeat testing.  While 2019 novel coronavirus  (SARS-CoV-2) nucleic acids may be present in the submitted sample  additional confirmatory testing may be necessary for epidemiological  and / or clinical management purposes  to differentiate between  SARS-CoV-2 and other Sarbecovirus currently known to infect humans.  If clinically indicated additional testing with an alternate test   methodology [id number]) is advised. The SARS-CoV-2 RNA is generally  detectable in upper and lower respiratory sp ecimens during the acute  phase of infection. The expected result is Negative. Fact Sheet for Patients:  StrictlyIdeas.no Fact Sheet for Healthcare Providers: BankingDealers.co.za This test is not yet approved or cleared by the [place] FDA and has been authorized for detection and/or diagnosis of SARS-CoV-2 by FDA under an Emergency Use Authorization (EUA).  This EUA will remain in effect (meaning this test can be used) for the duration of the COVID-19 declaration under Section 564(b)(1) of the Act, 21 U.S.C. section 360bbb-3(b)(1), unless the authorization is terminated or revoked sooner. Performed at [facility], [place] [address] [place], [place] [address]     Medications:  No current facility-administered medications for this encounter.    Current Outpatient Medications  Medication Sig Dispense Refill  . cariprazine (VRAYLAR) capsule Take 1 capsule (1.5 mg total) by mouth daily. (Patient not taking: Reported on 03/18/2019) 30 capsule 0  . FLUoxetine (PROZAC) 20 MG capsule Take 1 capsule (20 mg total) by mouth daily. (Patient not taking: Reported on 03/18/2019) 30 capsule 0  . hydrOXYzine (ATARAX/VISTARIL) 25 MG tablet Take 1 tablet (25 mg total) by mouth every 6 (six) hours as needed for anxiety. (Patient not taking: Reported on 03/18/2019) 30 tablet 0  . traZODone (DESYREL) 100 MG tablet Take 1 tablet (100 mg total) by mouth at bedtime as needed and may repeat dose one time if needed for sleep. (Patient not taking: Reported on 03/18/2019) 30 tablet 0    Musculoskeletal: Unable to access as evaluation via telepsych   Psychiatric Specialty Exam: Physical Exam  Nursing note and vitals reviewed. Constitutional: He is oriented to person, place, and time.  Neurological: He is alert and oriented to person, place, and  time.    Review of Systems  Psychiatric/Behavioral: Positive for substance abuse. Negative for depression, hallucinations, memory loss and suicidal ideas. The patient is not nervous/anxious and does not have insomnia.   All other systems reviewed and are negative.   Blood pressure (!) 106/52, pulse (!) 52, temperature 98.6 F (37 C), resp. rate 18, SpO2 100 %.There is no height or weight on file to calculate BMI.  General Appearance: Fairly Groomed  Eye Contact:  Good  Speech:  Clear and Coherent and Normal Rate  Volume:  Normal  Mood:  " I feel better."  Affect:  Appropriate  Thought Process:  Coherent and Descriptions of Associations: Intact  Orientation:  Full (Time, Place, and Person)  Thought Content:  WDL  Suicidal Thoughts:  No  Homicidal Thoughts:  No  Memory:  Immediate;   Fair Recent;  Fair  Judgement:  Fair  Insight:  Fair  Psychomotor Activity:  Normal  Concentration:  Concentration: Fair and Attention Span: Fair  Recall:  F[employer]Fair  Fund of Knowledge:  Fair  Language:  Good  Akathisia:  Negative  Handed:  Right  AIMS (if indicated):     Assets:  Communication Skills Desire for Improvement Resilience  ADL's:  Intact  Cognition:  WNL  Sleep:        Treatment Plan Summary: Daily contact with patient to assess and evaluate symptoms and progress in treatment  Disposition: No evidence of imminent risk to self or others at present.   Patient does not meet criteria for psychiatric inpatient admission. Reccommended to continue follow-up with his outpatient team..  Recommended to restart medications as noted above.    EDP Wilford CornerAlexander Law updated on current disposition.   This service was provided via telemedicine using a 2-way, interactive audio and video technology.  Names of all persons participating in this telemedicine service and their role in this encounter. Name: Denzil MagnusonLaShunda Jeanann Balinski  Role: FNP-C  Name: Adair PatterAdam Holden Role: Patient    Denzil MagnusonLaShunda Bernardette Waldron, NP 04/04/2019  10:16 AM

## 2019-04-04 NOTE — ED Notes (Signed)
Pt calling family member/friend from phone at nurses' desk notifying of d/c.

## 2019-04-04 NOTE — ED Provider Notes (Signed)
Patient initially sleeping on my evaluation.  Reassessment by behavioral health today after initial evaluation recommending inpatient treatment.  10:06 AM I spoke with Mark Maes, NP who reports now patient is psychiatrically clear.  He denies SI, HI, AVH.  He does request refills of his home medications.  10:24 AM I spoke with patient and he states he is feeling better.  He denies any SI, HI.  He reports he is hearing some voices, however they are at a minimum.  Patient will be discharged with new prescriptions for his home medications.       Frederica Kuster, PA-C 04/04/19 1026    Lacretia Leigh, MD 04/07/19 [phone]

## 2019-04-04 NOTE — ED Notes (Addendum)
IVC rescinded by Dr Johnney Killian- Copy faxed to [place] sent to Medical Records - Original placed in folder for [employer]. ALL belongings - 2 labeled belongings bags & 1 valuables envelope - returned to pt. Pt signed verifying all items present. Pt given paper blue scrub top shirt as requested d/t no shirt.

## 2019-08-24 ENCOUNTER — Emergency Department (HOSPITAL_COMMUNITY): Payer: Self-pay

## 2019-08-24 ENCOUNTER — Emergency Department (HOSPITAL_COMMUNITY)
Admission: EM | Admit: 2019-08-24 | Discharge: 2019-08-24 | Disposition: A | Discharge status: Home / Self Care | Payer: Self-pay | Attending: Emergency Medicine | Admitting: Emergency Medicine

## 2019-08-24 ENCOUNTER — Encounter (HOSPITAL_COMMUNITY): Payer: Self-pay | Admitting: Emergency Medicine

## 2019-08-24 ENCOUNTER — Other Ambulatory Visit: Payer: Self-pay

## 2019-08-24 DIAGNOSIS — F1910 Other psychoactive substance abuse, uncomplicated: Secondary | ICD-10-CM

## 2019-08-24 DIAGNOSIS — Z20828 Contact with and (suspected) exposure to other viral communicable diseases: Secondary | ICD-10-CM | POA: Insufficient documentation

## 2019-08-24 DIAGNOSIS — F1020 Alcohol dependence, uncomplicated: Secondary | ICD-10-CM

## 2019-08-24 DIAGNOSIS — F1419 Cocaine abuse with unspecified cocaine-induced disorder: Secondary | ICD-10-CM | POA: Insufficient documentation

## 2019-08-24 DIAGNOSIS — F1520 Other stimulant dependence, uncomplicated: Secondary | ICD-10-CM

## 2019-08-24 DIAGNOSIS — F329 Major depressive disorder, single episode, unspecified: Secondary | ICD-10-CM

## 2019-08-24 DIAGNOSIS — Y929 Unspecified place or not applicable: Secondary | ICD-10-CM | POA: Insufficient documentation

## 2019-08-24 DIAGNOSIS — F32A Depression, unspecified: Secondary | ICD-10-CM

## 2019-08-24 DIAGNOSIS — Y939 Activity, unspecified: Secondary | ICD-10-CM | POA: Insufficient documentation

## 2019-08-24 DIAGNOSIS — F1010 Alcohol abuse, uncomplicated: Secondary | ICD-10-CM

## 2019-08-24 DIAGNOSIS — F10288 Alcohol dependence with other alcohol-induced disorder: Secondary | ICD-10-CM | POA: Insufficient documentation

## 2019-08-24 DIAGNOSIS — S0990XA Unspecified injury of head, initial encounter: Secondary | ICD-10-CM | POA: Insufficient documentation

## 2019-08-24 DIAGNOSIS — X58XXXA Exposure to other specified factors, initial encounter: Secondary | ICD-10-CM | POA: Insufficient documentation

## 2019-08-24 DIAGNOSIS — Y999 Unspecified external cause status: Secondary | ICD-10-CM | POA: Insufficient documentation

## 2019-08-24 DIAGNOSIS — F1310 Sedative, hypnotic or anxiolytic abuse, uncomplicated: Secondary | ICD-10-CM | POA: Diagnosis present

## 2019-08-24 DIAGNOSIS — R440 Auditory hallucinations: Secondary | ICD-10-CM

## 2019-08-24 DIAGNOSIS — F17210 Nicotine dependence, cigarettes, uncomplicated: Secondary | ICD-10-CM | POA: Insufficient documentation

## 2019-08-24 DIAGNOSIS — Z79899 Other long term (current) drug therapy: Secondary | ICD-10-CM | POA: Insufficient documentation

## 2019-08-24 LAB — CBC
HCT: 53.4 % — ABNORMAL HIGH (ref 39.0–52.0)
Hemoglobin: 18.4 g/dL — ABNORMAL HIGH (ref 13.0–17.0)
MCH: 31.3 pg (ref 26.0–34.0)
MCHC: 34.5 g/dL (ref 30.0–36.0)
MCV: 90.8 fL (ref 80.0–100.0)
Platelets: 288 10*3/uL (ref 150–400)
RBC: 5.88 MIL/uL — ABNORMAL HIGH (ref 4.22–5.81)
RDW: 13 % (ref 11.5–15.5)
WBC: 12.7 10*3/uL — ABNORMAL HIGH (ref 4.0–10.5)
nRBC: 0 % (ref 0.0–0.2)

## 2019-08-24 LAB — COMPREHENSIVE METABOLIC PANEL
ALT: 29 U/L (ref 0–44)
AST: 33 U/L (ref 15–41)
Albumin: 5 g/dL (ref 3.5–5.0)
Alkaline Phosphatase: 96 U/L (ref 38–126)
Anion gap: 13 (ref 5–15)
BUN: 8 mg/dL (ref 6–20)
CO2: 24 mmol/L (ref 22–32)
Calcium: 8.9 mg/dL (ref 8.9–10.3)
Chloride: 104 mmol/L (ref 98–111)
Creatinine, Ser: 0.78 mg/dL (ref 0.61–1.24)
GFR calc Af Amer: >60 mL/min (ref >60)
GFR calc non Af Amer: >60 mL/min (ref >60)
Glucose, Bld: 103 mg/dL — ABNORMAL HIGH (ref 70–99)
Potassium: 3.3 mmol/L — ABNORMAL LOW (ref 3.5–5.1)
Sodium: 141 mmol/L (ref 135–145)
Total Bilirubin: 0.7 mg/dL (ref 0.3–1.2)
Total Protein: 8.4 g/dL — ABNORMAL HIGH (ref 6.5–8.1)

## 2019-08-24 LAB — SALICYLATE LEVEL: Salicylate Lvl: <7 mg/dL (ref 2.8–30.0)

## 2019-08-24 LAB — ETHANOL: Alcohol, Ethyl (B): 245 mg/dL — ABNORMAL HIGH (ref <10)

## 2019-08-24 LAB — ACETAMINOPHEN LEVEL: Acetaminophen (Tylenol), Serum: <10 ug/mL — ABNORMAL LOW (ref 10–30)

## 2019-08-24 LAB — POC SARS CORONAVIRUS 2 AG -  ED: SARS Coronavirus 2 Ag: NEGATIVE

## 2019-08-24 LAB — LIPASE, BLOOD: Lipase: 19 U/L (ref 11–51)

## 2019-08-24 MED ORDER — LORAZEPAM 2 MG/ML IJ SOLN: 0.0000 mg | Freq: Two times a day (BID) | INTRAMUSCULAR | Status: DC

## 2019-08-24 MED ORDER — ONDANSETRON HCL 4 MG/2ML IJ SOLN
4.0000 mg | Freq: Once | INTRAMUSCULAR | Status: AC
  Administered 2019-08-24: 12:00:00 4 mg via INTRAVENOUS
  Filled 2019-08-24: qty 2

## 2019-08-24 MED ORDER — ONDANSETRON HCL 4 MG PO TABS: 4.0000 mg | ORAL_TABLET | Freq: Three times a day (TID) | ORAL | Status: DC | PRN

## 2019-08-24 MED ORDER — POTASSIUM CHLORIDE CRYS ER 20 MEQ PO TBCR
40.0000 meq | EXTENDED_RELEASE_TABLET | Freq: Once | ORAL | Status: AC
  Administered 2019-08-24: 40 meq via ORAL
  Filled 2019-08-24: qty 2

## 2019-08-24 MED ORDER — SODIUM CHLORIDE 0.9 % IV BOLUS
1000.0000 mL | Freq: Once | INTRAVENOUS | Status: AC
  Administered 2019-08-24: 06:00:00 1000 mL via INTRAVENOUS

## 2019-08-24 MED ORDER — HYDROXYZINE HCL 25 MG PO TABS: 25.0000 mg | ORAL_TABLET | Freq: Four times a day (QID) | ORAL | Status: DC | PRN

## 2019-08-24 MED ORDER — THIAMINE HCL 100 MG/ML IJ SOLN: 100.0000 mg | Freq: Every day | INTRAMUSCULAR | Status: DC

## 2019-08-24 MED ORDER — CARIPRAZINE HCL 1.5 MG PO CAPS
1.5000 mg | ORAL_CAPSULE | Freq: Every day | ORAL | Status: DC
  Administered 2019-08-24: 1.5 mg via ORAL
  Filled 2019-08-24: qty 1

## 2019-08-24 MED ORDER — LORAZEPAM 1 MG PO TABS: 0.0000 mg | ORAL_TABLET | Freq: Four times a day (QID) | ORAL | Status: DC

## 2019-08-24 MED ORDER — LORAZEPAM 1 MG PO TABS: 0.0000 mg | ORAL_TABLET | Freq: Two times a day (BID) | ORAL | Status: DC

## 2019-08-24 MED ORDER — ONDANSETRON HCL 4 MG/2ML IJ SOLN
4.0000 mg | Freq: Once | INTRAMUSCULAR | Status: AC
  Administered 2019-08-24: 4 mg via INTRAVENOUS
  Filled 2019-08-24: qty 2

## 2019-08-24 MED ORDER — FLUOXETINE HCL 20 MG PO CAPS
20.0000 mg | ORAL_CAPSULE | Freq: Every day | ORAL | Status: DC
  Administered 2019-08-24: 20 mg via ORAL
  Filled 2019-08-24: qty 1

## 2019-08-24 MED ORDER — TRAZODONE HCL 100 MG PO TABS: 100.0000 mg | ORAL_TABLET | Freq: Every evening | ORAL | Status: DC | PRN

## 2019-08-24 MED ORDER — LORAZEPAM 2 MG/ML IJ SOLN
0.0000 mg | Freq: Four times a day (QID) | INTRAMUSCULAR | Status: DC
  Administered 2019-08-24: 13:00:00 1 mg via INTRAVENOUS
  Filled 2019-08-24: qty 1

## 2019-08-24 MED ORDER — SODIUM CHLORIDE 0.9 % IV BOLUS
1000.0000 mL | Freq: Once | INTRAVENOUS | Status: AC
  Administered 2019-08-24: 12:00:00 1000 mL via INTRAVENOUS

## 2019-08-24 MED ORDER — VITAMIN B-1 100 MG PO TABS
100.0000 mg | ORAL_TABLET | Freq: Every day | ORAL | Status: DC
  Administered 2019-08-24: 100 mg via ORAL
  Filled 2019-08-24: qty 1

## 2019-08-24 MED ORDER — ONDANSETRON HCL 4 MG PO TABS: 4.0000 mg | ORAL_TABLET | Freq: Once | ORAL | Status: DC

## 2019-08-24 NOTE — ED Triage Notes (Signed)
Arrives via EMS, patient is loudly dry heaving in triage, he bit the inside of his lip so there is slight blood in his sputum he is dry heaving. Patient has been smoking crack and has schizophrenia.

## 2019-08-24 NOTE — ED Notes (Signed)
Patient stated he wanted to be admitted and checked out by psych. He says he hears voices in his head that tell him to drink too much alcohol, and to harm other people.

## 2019-08-24 NOTE — Patient Outreach (Signed)
[facility]PSS met with the patient in order to provide substance use recovery support and provide information for substance use recovery resources. Patient reports a history of poly substance use with alcohol and crack cocaine use. Patient is interested in getting connected to residential substance use treatment resources. CPSS talked to the patient about available substance use treatment resources in the [place] area as well as provided the patient with a residential substance use treatment center list. CPSS also provided the patient with additional substance use recovery resource information including NA meeting list, flier for the [facility] outpatient dual diagnosis treatment services, and CPSS contact information. CPSS strongly encouraged the patient to follow up with CPSS if needed for further help with [facility]PSS substance use recovery outreach services.
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
## 2019-08-24 NOTE — Discharge Instructions (Signed)
Return to the emergency department for any new or worsening symptoms.  Resource guide included in discharge papers. Please seek outpatient treatment for alcohol and drug abuse
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
## 2019-09-05 ENCOUNTER — Ambulatory Visit
Admission: EM | Admit: 2019-09-05 | Discharge: 2019-09-05 | Disposition: A | Discharge status: Home / Self Care | Payer: Self-pay | Attending: Physician Assistant | Admitting: Physician Assistant

## 2019-09-05 ENCOUNTER — Other Ambulatory Visit: Payer: Self-pay

## 2019-09-05 DIAGNOSIS — M545 Low back pain, unspecified: Secondary | ICD-10-CM

## 2019-09-05 DIAGNOSIS — R21 Rash and other nonspecific skin eruption: Secondary | ICD-10-CM

## 2019-09-05 LAB — POCT URINALYSIS DIP (MANUAL ENTRY)
Bilirubin, UA: NEGATIVE
Blood, UA: NEGATIVE
Glucose, UA: NEGATIVE mg/dL
Ketones, POC UA: NEGATIVE mg/dL
Leukocytes, UA: NEGATIVE
Nitrite, UA: NEGATIVE
Protein Ur, POC: NEGATIVE mg/dL
Spec Grav, UA: 1.02 (ref 1.010–1.025)
Urobilinogen, UA: 0.2 E.U./dL
pH, UA: 7 (ref 5.0–8.0)

## 2019-09-05 MED ORDER — CLOTRIMAZOLE-BETAMETHASONE 1-0.05 % EX CREA: TOPICAL_CREAM | CUTANEOUS | 1 refills | Status: DC

## 2019-09-05 NOTE — ED Triage Notes (Signed)
Pt c/o rash across upper chest and bilateral upper shoulder/arm areas x 2wks. Pt also c/o bilateral flank pain for over 2wks, worse when drinking lots of fluid and when laying on that side the pain is worse.

## 2019-09-05 NOTE — ED Provider Notes (Signed)
[facility]    CSN: [id number] Arrival date & time: 09/05/19  7673      History   Chief Complaint Chief Complaint  Patient presents with  . Rash    HPI Mark Hammond is a 28 y.o. male.   28 year old male comes in for multiple complaints.   1. 2 week history of rash to the upper chest, bilateral upper shoulder/arms. Area is itching in sensation, worse after hot shower. States first started on the right chest, but has spread. Denies spreading erythema, warmth, fever. Denies pain. Denies URI symptoms such as cough, congestion, sore throat. Has not tried anything for the symptoms.  2.  2-week history of bilateral flank pain.  This is worse when drinking lots of fluids, but also with movements.  Denies urinary frequency, dysuria, hematuria.  Denies fever, chills, body aches.  Denies abdominal pain, nausea, vomiting, diarrhea.  Has not tried anything for the symptoms.     Past Medical History:  Diagnosis Date  . Bipolar 1 disorder ([place])   . Dental abscess   . Depression   . Schizo affective schizophrenia [facility])     Patient Active Problem List   Diagnosis Date Noted  . Methamphetamine use disorder, severe ([place]) 08/24/2019  . Alcohol use disorder, severe, dependence ([place]) 08/24/2019  . 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 No pertinent surgical history.     Home Medications    Prior to Admission medications   Medication Sig Start Date End Date Taking? Authorizing Provider  clotrimazole-betamethasone (LOTRISONE) cream Apply to affected area 2 times daily for 14 days, or until rash resolves. 09/05/19   Tasia Catchings, Demesha Boorman V, PA-C  traZODone (DESYREL) 100 MG tablet Take 1 tablet (100 mg total) by mouth at bedtime as needed and may repeat dose one time if needed for sleep. 04/04/19   Frederica Kuster, PA-C    Family History Family History  Problem Relation Age of Onset  . Stroke Other     Social History Social History    Tobacco Use  . Smoking status: Current Every Day Smoker    Packs/day: 1.00    Types: Cigarettes  . Smokeless tobacco: Never Used  Substance Use Topics  . Alcohol use: Yes    Comment: Drinks 3-4 times a week. Last drink: 2 days  . Drug use: Not Currently    Comment: last used meth 1 week ago     Allergies   Codeine   Review of Systems Review of Systems  Reason unable to perform ROS: See HPI as above.     Physical Exam Triage Vital Signs ED Triage Vitals [09/05/19 0953]  Enc Vitals Group     BP 120/72     Pulse Rate 65     Resp 16     Temp 98.6 F (37 C)     Temp Source Oral     SpO2 98 %     Weight      Height      Head Circumference      Peak Flow      Pain Score 4     Pain Loc      Pain Edu?      Excl. in [place]?    No data found.  Updated Vital Signs BP 120/72 (BP Location: Left Arm)   Pulse 65   Temp 98.6 F (37 C) (Oral)   Resp 16   SpO2 98%   Physical Exam Constitutional:  General: He is not in acute distress.    Appearance: He is well-developed. He is not diaphoretic.  HENT:     Head: Normocephalic and atraumatic.  Eyes:     Conjunctiva/sclera: Conjunctivae normal.     Pupils: Pupils are equal, round, and reactive to light.  Cardiovascular:     Rate and Rhythm: Normal rate and regular rhythm.     Heart sounds: Normal heart sounds. No murmur. No friction rub. No gallop.   Pulmonary:     Effort: Pulmonary effort is normal. No accessory muscle usage or respiratory distress.     Breath sounds: Normal breath sounds. No stridor. No decreased breath sounds, wheezing, rhonchi or rales.  Chest:     Comments: Numerous circular rash with central clearing to bilateral chest, anterior upper shoulder. No erythema, warmth. No tenderness to palpation. No rash seen to the skin folds.  Musculoskeletal:     Comments: No tenderness on palpation of the spinous processes. Tenderness to palpation of bilateral lumbar back diffusely. Full range of motion of back  and hips. Strength normal and equal bilaterally. Sensation intact and equal bilaterally.  Skin:    General: Skin is warm and dry.  Neurological:     Mental Status: He is alert and oriented to person, place, and time.      UC Treatments / Results  Labs (all labs ordered are listed, but only abnormal results are displayed) Labs Reviewed  POCT URINALYSIS DIP (MANUAL ENTRY) - Normal    EKG   Radiology No results found.  Procedures Procedures (including critical care time)  Medications Ordered in UC Medications - No data to display  Initial Impression / Assessment and Plan / UC Course  I have reviewed the triage vital signs and the nursing notes.  Pertinent labs & imaging results that were available during my care of the patient were reviewed by me and considered in my medical decision making (see chart for details).    1. Rash ?tinea vs eczema.  At this time, patient to start Lotrisone as directed.  Avoid new hygiene product changes.  Return precautions given.  Patient expresses understanding and agrees to plan.  2.  Acute bilateral low back pain without sciatica No normal signs on exam.  Urine without infection, hematuria.  NSAIDs, ice compress, rest.  Return precautions given.  Patient expresses understanding and agrees to plan.  Final Clinical Impressions(s) / UC Diagnoses   Final diagnoses:  Rash  Acute bilateral low back pain without sciatica   ED Prescriptions    Medication Sig Dispense Auth. Provider   clotrimazole-betamethasone (LOTRISONE) cream Apply to affected area 2 times daily for 14 days, or until rash resolves. 45 g Belinda Fisher, PA-C     PDMP not reviewed this encounter.   Belinda Fisher, PA-C 09/05/19 1409

## 2019-09-05 NOTE — Discharge Instructions (Signed)
Start lotrisone as directed for the next 2 weeks. Avoid new hygiene product changes. Avoid itching, you can take over the counter allergy medicine if needed. Follow up with PCP for further evaluation if symptoms continues.  Urine without infection, blood. Start ibuprofen 800mg  three times a day for back pain. Follow up with PCP if symptoms not improving.

## 2019-10-07 ENCOUNTER — Other Ambulatory Visit: Payer: Self-pay

## 2019-10-07 ENCOUNTER — Emergency Department (HOSPITAL_COMMUNITY)
Admission: EM | Admit: 2019-10-07 | Discharge: 2019-10-07 | Disposition: A | Discharge status: Home / Self Care | Payer: Self-pay | Attending: Emergency Medicine | Admitting: Emergency Medicine

## 2019-10-07 ENCOUNTER — Encounter (HOSPITAL_COMMUNITY): Payer: Self-pay | Admitting: Emergency Medicine

## 2019-10-07 DIAGNOSIS — F259 Schizoaffective disorder, unspecified: Secondary | ICD-10-CM | POA: Insufficient documentation

## 2019-10-07 DIAGNOSIS — F1419 Cocaine abuse with unspecified cocaine-induced disorder: Secondary | ICD-10-CM | POA: Insufficient documentation

## 2019-10-07 DIAGNOSIS — F1520 Other stimulant dependence, uncomplicated: Secondary | ICD-10-CM | POA: Diagnosis present

## 2019-10-07 DIAGNOSIS — R45851 Suicidal ideations: Secondary | ICD-10-CM

## 2019-10-07 DIAGNOSIS — F1020 Alcohol dependence, uncomplicated: Secondary | ICD-10-CM | POA: Diagnosis present

## 2019-10-07 DIAGNOSIS — R440 Auditory hallucinations: Secondary | ICD-10-CM

## 2019-10-07 DIAGNOSIS — R4689 Other symptoms and signs involving appearance and behavior: Secondary | ICD-10-CM

## 2019-10-07 DIAGNOSIS — F17210 Nicotine dependence, cigarettes, uncomplicated: Secondary | ICD-10-CM | POA: Insufficient documentation

## 2019-10-07 DIAGNOSIS — Z20822 Contact with and (suspected) exposure to covid-19: Secondary | ICD-10-CM | POA: Insufficient documentation

## 2019-10-07 DIAGNOSIS — F1310 Sedative, hypnotic or anxiolytic abuse, uncomplicated: Secondary | ICD-10-CM | POA: Diagnosis present

## 2019-10-07 DIAGNOSIS — F329 Major depressive disorder, single episode, unspecified: Secondary | ICD-10-CM | POA: Diagnosis present

## 2019-10-07 DIAGNOSIS — Z23 Encounter for immunization: Secondary | ICD-10-CM | POA: Insufficient documentation

## 2019-10-07 LAB — COMPREHENSIVE METABOLIC PANEL
ALT: 24 U/L (ref 0–44)
AST: 31 U/L (ref 15–41)
Albumin: 4.3 g/dL (ref 3.5–5.0)
Alkaline Phosphatase: 90 U/L (ref 38–126)
Anion gap: 14 (ref 5–15)
BUN: 10 mg/dL (ref 6–20)
CO2: 23 mmol/L (ref 22–32)
Calcium: 8.5 mg/dL — ABNORMAL LOW (ref 8.9–10.3)
Chloride: 106 mmol/L (ref 98–111)
Creatinine, Ser: 0.78 mg/dL (ref 0.61–1.24)
GFR calc Af Amer: >60 mL/min (ref >60)
GFR calc non Af Amer: >60 mL/min (ref >60)
Glucose, Bld: 107 mg/dL — ABNORMAL HIGH (ref 70–99)
Potassium: 3.5 mmol/L (ref 3.5–5.1)
Sodium: 143 mmol/L (ref 135–145)
Total Bilirubin: 0.8 mg/dL (ref 0.3–1.2)
Total Protein: 7.6 g/dL (ref 6.5–8.1)

## 2019-10-07 LAB — RAPID URINE DRUG SCREEN, HOSP PERFORMED
Amphetamines: NOT DETECTED
Barbiturates: NOT DETECTED
Benzodiazepines: NOT DETECTED
Cocaine: POSITIVE — AB
Opiates: NOT DETECTED
Tetrahydrocannabinol: POSITIVE — AB

## 2019-10-07 LAB — CBC WITH DIFFERENTIAL/PLATELET
Abs Immature Granulocytes: 0.1 10*3/uL — ABNORMAL HIGH (ref 0.00–0.07)
Basophils Absolute: 0.1 10*3/uL (ref 0.0–0.1)
Basophils Relative: 1 %
Eosinophils Absolute: 0 10*3/uL (ref 0.0–0.5)
Eosinophils Relative: 0 %
HCT: 47 % (ref 39.0–52.0)
Hemoglobin: 16.3 g/dL (ref 13.0–17.0)
Immature Granulocytes: 1 %
Lymphocytes Relative: 25 %
Lymphs Abs: 2.5 10*3/uL (ref 0.7–4.0)
MCH: 31.9 pg (ref 26.0–34.0)
MCHC: 34.7 g/dL (ref 30.0–36.0)
MCV: 92 fL (ref 80.0–100.0)
Monocytes Absolute: 0.6 10*3/uL (ref 0.1–1.0)
Monocytes Relative: 6 %
Neutro Abs: 6.6 10*3/uL (ref 1.7–7.7)
Neutrophils Relative %: 67 %
Platelets: 261 10*3/uL (ref 150–400)
RBC: 5.11 MIL/uL (ref 4.22–5.81)
RDW: 12.4 % (ref 11.5–15.5)
WBC: 9.9 10*3/uL (ref 4.0–10.5)
nRBC: 0 % (ref 0.0–0.2)

## 2019-10-07 LAB — ACETAMINOPHEN LEVEL: Acetaminophen (Tylenol), Serum: <10 ug/mL — ABNORMAL LOW (ref 10–30)

## 2019-10-07 LAB — SALICYLATE LEVEL: Salicylate Lvl: <7 mg/dL — ABNORMAL LOW (ref 7.0–30.0)

## 2019-10-07 LAB — ETHANOL: Alcohol, Ethyl (B): 208 mg/dL — ABNORMAL HIGH (ref <10)

## 2019-10-07 LAB — CBG MONITORING, ED: Glucose-Capillary: 114 mg/dL — ABNORMAL HIGH (ref 70–99)

## 2019-10-07 LAB — SARS CORONAVIRUS 2 (TAT 6-24 HRS): SARS Coronavirus 2: NEGATIVE

## 2019-10-07 MED ORDER — TETANUS-DIPHTH-ACELL PERTUSSIS 5-2.5-18.5 LF-MCG/0.5 IM SUSP
0.5000 mL | Freq: Once | INTRAMUSCULAR | Status: AC
  Administered 2019-10-07: 0.5 mL via INTRAMUSCULAR
  Filled 2019-10-07: qty 0.5
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
## 2019-10-07 NOTE — ED Notes (Signed)
TTS clinician attempted to contact telemachine multiple times after speaking with nurse, no answer. Called Quitta, Consulting civil engineer, informed clinician to move to next patient. TTS Clinician will attempt assessment at later time.
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
## 2019-10-07 NOTE — Discharge Instructions (Signed)
For your behavioral health needs you are advised to follow up with [facility].  New patients are seen at their walk-in clinic.  Walk-in hours are Monday - Friday from 8:30 am - 12:00 pm, and from 1:00 pm - 2:30 pm.  Walk-in patients are seen on a first come, first served basis, so try to arrive as early as possible for the best chance of being seen the same day:       [facility]      [address]      [place], [place] [address]      [phone] 

## 2019-10-07 NOTE — BH Assessment (Signed)
BHH Assessment Progress Note  Per Nelly Rout, MD, this pt does not require psychiatric hospitalization at this time.  Pt is to be discharged from [facility] with recommendation to follow up with [facility].  This has been included in pt's discharge instructions.  Pt would also benefit from seeing Peer Support Specialists; they will be asked to speak to pt.  Pt's nurse has been notified.  Doylene Canning, MA Triage Specialist [phone]

## 2019-10-07 NOTE — BHH Suicide Risk Assessment (Cosign Needed)
Suicide Risk Assessment  Discharge Assessment   [facility] Discharge Suicide Risk Assessment   Principal Problem: Cocaine abuse with cocaine-induced disorder [facility]) Discharge Diagnoses: Principal Problem:   Cocaine abuse with cocaine-induced disorder ([place]) Active Problems:   MDD (major depressive disorder)   Methamphetamine use disorder, severe (HCC)   Alcohol use disorder, severe, dependence ([place])   Total Time spent with patient: 30 minutes  Musculoskeletal: Strength & Muscle Tone: within normal limits Gait & Station: normal Patient leans: N/A  Psychiatric Specialty Exam:   Blood pressure 104/67, pulse 61, temperature 97.6 F (36.4 C), temperature source Oral, resp. rate 16, height 5\' 5"  (1.651 m), weight 62.6 kg, SpO2 92 %.Body mass index is 22.96 kg/m.  General Appearance: Casual  Eye Contact::  Good  Speech:  Clear and Coherent and Normal Rate409  Volume:  Normal  Mood:  "Good. Better." Appropriate  Affect:  Appropriate and Congruent  Thought Process:  Coherent, Goal Directed and Descriptions of Associations: Intact  Orientation:  Full (Time, Place, and Person)  Thought Content:  WDL  Suicidal Thoughts:  No  Homicidal Thoughts:  No  Memory:  Immediate;   Good Recent;   Good  Judgement:  Intact  Insight:  Present  Psychomotor Activity:  Normal  Concentration:  Good  Recall:  Good  Fund of Knowledge:Good  Language: Good  Akathisia:  No  Handed:  Right  AIMS (if indicated):     Assets:  Communication Skills Desire for Improvement Resilience Social Support  Sleep:     Cognition: WNL  ADL's:  Intact   Mental Status Per Nursing Assessment::   On Admission:    Mark Hammond, 29 y.o., male patient seen via tele psych by this provider, Dr. Dwyane Dee; and chart reviewed on 10/07/19.  On evaluation Mark Hammond reports that he is feeling better this morning.  States he was intoxicated last night and that he does have a drinking problem and would like rehab information and  outpatient substance use services.  Discussed with patient referring to peers support to assist with resources understanding voiced.  Patient denies suicidal/self-harm/homicidal ideation, psychosis, paranoia.  Patient states he is living in [place] with his family.  During evaluation Mark Hammond is alert/oriented x 4; calm/cooperative; and mood is congruent with affect.  He does not appear to be responding to internal/external stimuli or delusional thoughts.  Patient denies suicidal/self-harm/homicidal ideation, psychosis, and paranoia.  Patient answered question appropriately.     Demographic Factors:  Male and Caucasian  Loss Factors: NA  Historical Factors: NA  Risk Reduction Factors:   Religious beliefs about death and Living with another person, especially a relative  Continued Clinical Symptoms:  Alcohol/Substance Abuse/Dependencies  Cognitive Features That Contribute To Risk:  None    Suicide Risk:  Minimal: No identifiable suicidal ideation.  Patients presenting with no risk factors but with morbid ruminations; may be classified as minimal risk based on the severity of the depressive symptoms    Plan Of Care/Follow-up recommendations:  Activity:  As tolerated Diet:  Heart healthy    Discharge Instructions     For your behavioral health needs you are advised to follow up with [facility] [place].  New patients are seen at their walk-in clinic.  Walk-in hours are Monday - Friday from 8:30 am - 12:00 pm, and from 1:00 pm - 2:30 pm.  Walk-in patients are seen on a first come, first served basis, so try to arrive as early as possible for the best chance of being  seen the same day:       [facility]      [address]      [place], [place] [address]      [phone]    Disposition: Patient psychiatrically cleared No evidence of imminent risk to self or others at present.   Patient does not meet criteria for psychiatric inpatient  admission. Supportive therapy provided about ongoing stressors. Discussed crisis plan, support from social network, calling 911, coming to the Emergency Department, and calling Suicide Hotline.   Baneen Wieseler, NP 10/07/2019, 10:10 AM

## 2019-10-22 ENCOUNTER — Ambulatory Visit: Admission: EM | Admit: 2019-10-22 | Discharge: 2019-10-22 | Discharge status: Absent without leave | Payer: Self-pay

## 2019-10-22 ENCOUNTER — Encounter (HOSPITAL_COMMUNITY): Payer: Self-pay | Admitting: Emergency Medicine

## 2019-10-22 ENCOUNTER — Other Ambulatory Visit: Payer: Self-pay

## 2019-10-22 ENCOUNTER — Emergency Department (HOSPITAL_COMMUNITY)
Admission: EM | Admit: 2019-10-22 | Discharge: 2019-10-22 | Discharge status: Against Medical Advice | Payer: Self-pay | Attending: Emergency Medicine | Admitting: Emergency Medicine

## 2019-10-22 DIAGNOSIS — Z5321 Procedure and treatment not carried out due to patient leaving prior to being seen by health care provider: Secondary | ICD-10-CM | POA: Insufficient documentation

## 2019-10-22 NOTE — ED Triage Notes (Signed)
Pt states that he wants to go bc of the wait time. Pt gave stickers back to this RN.

## 2019-10-22 NOTE — ED Triage Notes (Signed)
Pt reports been doing drugs and drinking and not taking care of himself like he should. Reports that having back pains, bilat leg pains, needs medications refilled. Reports had sob after doing 1.2 gram of meth during the night.

## 2019-10-24 ENCOUNTER — Encounter (HOSPITAL_COMMUNITY): Payer: Self-pay | Admitting: Emergency Medicine

## 2019-10-24 ENCOUNTER — Other Ambulatory Visit: Payer: Self-pay

## 2019-10-24 ENCOUNTER — Emergency Department (HOSPITAL_COMMUNITY)
Admission: EM | Admit: 2019-10-24 | Discharge: 2019-10-24 | Disposition: A | Discharge status: Home / Self Care | Payer: Self-pay | Attending: Emergency Medicine | Admitting: Emergency Medicine

## 2019-10-24 DIAGNOSIS — F1914 Other psychoactive substance abuse with psychoactive substance-induced mood disorder: Secondary | ICD-10-CM

## 2019-10-24 DIAGNOSIS — F17210 Nicotine dependence, cigarettes, uncomplicated: Secondary | ICD-10-CM | POA: Insufficient documentation

## 2019-10-24 DIAGNOSIS — F1994 Other psychoactive substance use, unspecified with psychoactive substance-induced mood disorder: Secondary | ICD-10-CM | POA: Diagnosis present

## 2019-10-24 DIAGNOSIS — F259 Schizoaffective disorder, unspecified: Secondary | ICD-10-CM | POA: Insufficient documentation

## 2019-10-24 DIAGNOSIS — Z046 Encounter for general psychiatric examination, requested by authority: Secondary | ICD-10-CM | POA: Insufficient documentation

## 2019-10-24 DIAGNOSIS — Z20822 Contact with and (suspected) exposure to covid-19: Secondary | ICD-10-CM | POA: Insufficient documentation

## 2019-10-24 DIAGNOSIS — R45851 Suicidal ideations: Secondary | ICD-10-CM

## 2019-10-24 LAB — COMPREHENSIVE METABOLIC PANEL WITH GFR
ALT: 57 U/L — ABNORMAL HIGH (ref 0–44)
AST: 86 U/L — ABNORMAL HIGH (ref 15–41)
Albumin: 4.4 g/dL (ref 3.5–5.0)
Alkaline Phosphatase: 98 U/L (ref 38–126)
Anion gap: 13 (ref 5–15)
BUN: 8 mg/dL (ref 6–20)
CO2: 25 mmol/L (ref 22–32)
Calcium: 9.1 mg/dL (ref 8.9–10.3)
Chloride: 101 mmol/L (ref 98–111)
Creatinine, Ser: 0.85 mg/dL (ref 0.61–1.24)
GFR calc Af Amer: >60 mL/min
GFR calc non Af Amer: >60 mL/min
Glucose, Bld: 105 mg/dL — ABNORMAL HIGH (ref 70–99)
Potassium: 3.6 mmol/L (ref 3.5–5.1)
Sodium: 139 mmol/L (ref 135–145)
Total Bilirubin: 0.9 mg/dL (ref 0.3–1.2)
Total Protein: 7.6 g/dL (ref 6.5–8.1)

## 2019-10-24 LAB — RAPID URINE DRUG SCREEN, HOSP PERFORMED
Amphetamines: NOT DETECTED
Barbiturates: NOT DETECTED
Benzodiazepines: NOT DETECTED
Cocaine: POSITIVE — AB
Opiates: NOT DETECTED
Tetrahydrocannabinol: POSITIVE — AB

## 2019-10-24 LAB — CBC
HCT: 48.6 % (ref 39.0–52.0)
Hemoglobin: 17.6 g/dL — ABNORMAL HIGH (ref 13.0–17.0)
MCH: 32.7 pg (ref 26.0–34.0)
MCHC: 36.2 g/dL — ABNORMAL HIGH (ref 30.0–36.0)
MCV: 90.3 fL (ref 80.0–100.0)
Platelets: 260 10*3/uL (ref 150–400)
RBC: 5.38 MIL/uL (ref 4.22–5.81)
RDW: 12.7 % (ref 11.5–15.5)
WBC: 10.9 10*3/uL — ABNORMAL HIGH (ref 4.0–10.5)
nRBC: 0 % (ref 0.0–0.2)

## 2019-10-24 LAB — ETHANOL: Alcohol, Ethyl (B): 126 mg/dL — ABNORMAL HIGH (ref <10)

## 2019-10-24 LAB — ACETAMINOPHEN LEVEL: Acetaminophen (Tylenol), Serum: <10 ug/mL — ABNORMAL LOW (ref 10–30)

## 2019-10-24 LAB — RESPIRATORY PANEL BY RT PCR (FLU A&B, COVID)
Influenza A by PCR: NEGATIVE
Influenza B by PCR: NEGATIVE
SARS Coronavirus 2 by RT PCR: NEGATIVE

## 2019-10-24 LAB — SALICYLATE LEVEL: Salicylate Lvl: <7 mg/dL — ABNORMAL LOW (ref 7.0–30.0)

## 2019-10-24 MED ORDER — OLANZAPINE 5 MG PO TBDP
10.0000 mg | ORAL_TABLET | Freq: Three times a day (TID) | ORAL | Status: DC | PRN
  Filled 2019-10-24: qty 2

## 2019-10-24 MED ORDER — ZIPRASIDONE MESYLATE 20 MG IM SOLR: 20.0000 mg | INTRAMUSCULAR | Status: DC | PRN

## 2019-10-24 NOTE — Progress Notes (Signed)
Pt has been psychiatrically cleared per Reola Calkins, NP. Pt is requesting residential substance abuse treatment. He is not interested in [facility] as it's, "too far away". CSW contacted [facility] in [place]. He is now on their residential waiting list and will need to call them daily to check on bed availability. CSW also contacted [facility] in [place], [place]. This agency has a month long waiting list for their residential program. Pt's referral information was provided.  Information on these agencies, including [facility], was faxed to [facility] ED for pt to review.   Wells Guiles, LCSW, LCAS Disposition CSW [facility] BHH/TTS [phone]

## 2019-10-24 NOTE — ED Notes (Signed)
TTS at present in room 48, ambulatory to room

## 2019-10-24 NOTE — ED Notes (Signed)
Lunch Tray Ordered @ 1033. 

## 2019-10-24 NOTE — Discharge Instructions (Addendum)
Follow-up per behavioral health recommendations. 

## 2019-10-24 NOTE — Consult Note (Signed)
Telepsych Consultation   Reason for Consult:  Polysubstance abuse Referring Physician:  EDP Location of Patient:  Location of Provider: Behavioral Health TTS Department  Patient Identification: Noland Pizano MRN:  [id number] Principal Diagnosis: Substance induced mood disorder (HCC) Diagnosis:  Principal Problem:   Substance induced mood disorder (HCC)   Total Time spent with patient: 30 minutes  Subjective:   Sawyer Mentzer is a 29 y.o. male patient reports that he came to the emergency room because he feels that his substance abuse is getting out of control when asked taking over him.  He reports that he has been using meth, heroin, cocaine, marijuana, and alcohol.  Patient reports that he knows that he needs to get help and he feels that his substance use makes him feel unsafe about himself avoiding drugs when he leaves.  Patient states that the major concern for him is to get into a residential treatment facility to get the help that he needs.  Patient states that currently he is homeless.  Patient denies any suicidal or homicidal ideations and denies any hallucinations at the time.  HPI: Per EDP:  29 y.o. male.Patient presents to the emergency department for evaluation of suicidal ideation.  Patient endorses feeling unsafe at home.  A reports polysubstance drug abuse including alcohol abuse.  He feels that he is unsafe because he has been contemplating overdosing  Patient is seen by this provider via telepsych.  Patient is pleasant, calm, and cooperative.  Patient does not appear to be depressed nor agitated.  Patient does appear to be slightly anxious during the evaluation.  Patient has continued to deny any suicidal or homicidal ideations and denies any hallucinations.  Patient continues to have concerns about his substance use and is requesting treatment for it.  Patient was offered during rescue mission and patient refused and stated that it was too far away.  TTS social worker is working on  assisting with placement to other facilities but most of them have reported back as being full or having to be placed on a waiting list.  At this time the patient does not meet inpatient psychiatric treatment criteria and is psychiatrically cleared.  If TTS social worker does not find placement for this patient directly from the hospital will provide the patient with outpatient resources for treatment.  I have consulted with Dr. Lucianne Muss.  I have notified Dr. Jodi Mourning about the recommendations for discharge and follow-up appointments.  Past Psychiatric History: Polysubstance abuse, MDD  Risk to Self: Suicidal Ideation: Yes-Currently Present Suicidal Intent: Yes-Currently Present Is patient at risk for suicide?: Yes Suicidal Plan?: Yes-Currently Present Specify Current Suicidal Plan: overdose Access to Means: Yes Specify Access to Suicidal Means: drugs What has been your use of drugs/alcohol within the last 12 months?: meth, cocaine, heroin, cannabis How many times?: (pt denies) Triggers for Past Attempts: Hallucinations Intentional Self Injurious Behavior: Cutting Comment - Self Injurious Behavior: pt reports cutting himself on his stomach 1 month ago Risk to Others: Homicidal Ideation: No-Not Currently/Within Last 6 Months Thoughts of Harm to Others: No-Not Currently Present/Within Last 6 Months Current Homicidal Intent: No-Not Currently/Within Last 6 Months Current Homicidal Plan: No Access to Homicidal Means: No History of harm to others?: No Assessment of Violence: None Noted Does patient have access to weapons?: No Criminal Charges Pending?: No Does patient have a court date: Yes Court Date: 11/01/19 Prior Inpatient Therapy: Prior Inpatient Therapy: Yes Prior Therapy Dates: 2020 Prior Therapy Facilty/Provider(s): unknown Reason for Treatment: MH Prior Outpatient Therapy:  Prior Outpatient Therapy: No Does patient have an ACCT team?: No Does patient have Intensive In-House Services?  :  No Does patient have [facility] services? : No Does patient have P4CC services?: No  Past Medical History:  Past Medical History:  Diagnosis Date  . Bipolar 1 disorder (HCC)   . Dental abscess   . Depression   . Schizo affective schizophrenia (HCC)    History reviewed. No pertinent surgical history. Family History:  Family History  Problem Relation Age of Onset  . Stroke Other    Family Psychiatric  History: None reported Social History:  Social History   Substance and Sexual Activity  Alcohol Use Yes   Comment: Drinks 3-4 times a week. Last drink: 2 days     Social History   Substance and Sexual Activity  Drug Use Not Currently   Comment: last used meth 1 week ago    Social History   Socioeconomic History  . Marital status: Single    Spouse name: Not on file  . Number of children: Not on file  . Years of education: Not on file  . Highest education level: Not on file  Occupational History  . Not on file  Tobacco Use  . Smoking status: Current Every Day Smoker    Packs/day: 1.00    Types: Cigarettes  . Smokeless tobacco: Never Used  Substance and Sexual Activity  . Alcohol use: Yes    Comment: Drinks 3-4 times a week. Last drink: 2 days  . Drug use: Not Currently    Comment: last used meth 1 week ago  . Sexual activity: Not on file  Other Topics Concern  . Not on file  Social History Narrative  . Not on file   Social Determinants of Health   Financial Resource Strain:   . Difficulty of Paying Living Expenses: Not on file  Food Insecurity:   . Worried About Programme researcher, broadcasting/film/videounning Out of Food in the Last Year: Not on file  . Ran Out of Food in the Last Year: Not on file  Transportation Needs:   . Lack of Transportation (Medical): Not on file  . Lack of Transportation (Non-Medical): Not on file  Physical Activity:   . Days of Exercise per Week: Not on file  . Minutes of Exercise per Session: Not on file  Stress:   . Feeling of Stress : Not on file  Social  Connections:   . Frequency of Communication with Friends and Family: Not on file  . Frequency of Social Gatherings with Friends and Family: Not on file  . Attends Religious Services: Not on file  . Active Member of Clubs or Organizations: Not on file  . Attends BankerClub or Organization Meetings: Not on file  . Marital Status: Not on file   Additional Social History:    Allergies:   Allergies  Allergen Reactions  . Codeine Other (See Comments)    Patient states parents told him he was allergic at a young age.    Labs:  Results for orders placed or performed during the hospital encounter of 10/24/19 (from the past 48 hour(s))  Rapid urine drug screen (hospital performed)     Status: Abnormal   Collection Time: 10/24/19 12:33 AM  Result Value Ref Range   Opiates NONE DETECTED NONE DETECTED   Cocaine POSITIVE (A) NONE DETECTED   Benzodiazepines NONE DETECTED NONE DETECTED   Amphetamines NONE DETECTED NONE DETECTED   Tetrahydrocannabinol POSITIVE (A) NONE DETECTED   Barbiturates NONE DETECTED NONE  DETECTED    Comment: (NOTE) DRUG SCREEN FOR MEDICAL PURPOSES ONLY.  IF CONFIRMATION IS NEEDED FOR ANY PURPOSE, NOTIFY LAB WITHIN 5 DAYS. LOWEST DETECTABLE LIMITS FOR URINE DRUG SCREEN Drug Class                     Cutoff (ng/mL) Amphetamine and metabolites    1000 Barbiturate and metabolites    200 Benzodiazepine                 200 Tricyclics and metabolites     300 Opiates and metabolites        300 Cocaine and metabolites        300 THC                            50 Performed at [facility], [address] [place], [place] [address]   Comprehensive metabolic panel     Status: Abnormal   Collection Time: 10/24/19 12:37 AM  Result Value Ref Range   Sodium 139 135 - 145 mmol/L   Potassium 3.6 3.5 - 5.1 mmol/L   Chloride 101 98 - 111 mmol/L   CO2 25 22 - 32 mmol/L   Glucose, Bld 105 (H) 70 - 99 mg/dL   BUN 8 6 - 20 mg/dL   Creatinine, Ser 6.78 0.61 - 1.24 mg/dL    Calcium 9.1 8.9 - 93.8 mg/dL   Total Protein 7.6 6.5 - 8.1 g/dL   Albumin 4.4 3.5 - 5.0 g/dL   AST 86 (H) 15 - 41 U/L   ALT 57 (H) 0 - 44 U/L   Alkaline Phosphatase 98 38 - 126 U/L   Total Bilirubin 0.9 0.3 - 1.2 mg/dL   GFR calc non Af Amer >60 >60 mL/min   GFR calc Af Amer >60 >60 mL/min   Anion gap 13 5 - 15    Comment: Performed at [facility], [address] [place], [place] [address]  Ethanol     Status: Abnormal   Collection Time: 10/24/19 12:37 AM  Result Value Ref Range   Alcohol, Ethyl (B) 126 (H) <10 mg/dL    Comment: (NOTE) Lowest detectable limit for serum alcohol is 10 mg/dL. For medical purposes only. Performed at [facility], [address] [place], [place] [address]   Salicylate level     Status: Abnormal   Collection Time: 10/24/19 12:37 AM  Result Value Ref Range   Salicylate Lvl <7.0 (L) 7.0 - 30.0 mg/dL    Comment: Performed at [facility], [address], [place], [place] [address]  Acetaminophen level     Status: Abnormal   Collection Time: 10/24/19 12:37 AM  Result Value Ref Range   Acetaminophen (Tylenol), Serum <10 (L) 10 - 30 ug/mL    Comment: (NOTE) Therapeutic concentrations vary significantly. A range of 10-30 ug/mL  may be an effective concentration for many patients. However, some  are best treated at concentrations outside of this range. Acetaminophen concentrations >150 ug/mL at 4 hours after ingestion  and >50 ug/mL at 12 hours after ingestion are often associated with  toxic reactions. Performed at [facility], [address] [place], [place] [address]   cbc     Status: Abnormal   Collection Time: 10/24/19 12:37 AM  Result Value Ref Range   WBC 10.9 (H) 4.0 - 10.5 K/uL   RBC 5.38 4.22 - 5.81 MIL/uL  Hemoglobin 17.6 (H) 13.0 - 17.0 g/dL   HCT 48.6 39.0 - 52.0 %   MCV 90.3 80.0 - 100.0 fL   MCH 32.7 26.0 - 34.0 pg   MCHC 36.2 (H) 30.0 - 36.0 g/dL   RDW 12.7 11.5 - 15.5 %   Platelets 260 150 - 400 K/uL    nRBC 0.0 0.0 - 0.2 %    Comment: Performed at [facility] [address] [place], [place] [address]  Respiratory Panel by RT PCR (Flu A&B, Covid) - Nasopharyngeal Swab     Status: None   Collection Time: 10/24/19  6:07 AM   Specimen: Nasopharyngeal Swab  Result Value Ref Range   SARS Coronavirus 2 by RT PCR NEGATIVE NEGATIVE    Comment: (NOTE) SARS-CoV-2 target nucleic acids are NOT DETECTED. The SARS-CoV-2 RNA is generally detectable in upper respiratoy specimens during the acute phase of infection. The lowest concentration of SARS-CoV-2 viral copies this assay can detect is 131 copies/mL. A negative result does not preclude SARS-Cov-2 infection and should not be used as the sole basis for treatment or other patient management decisions. A negative result may occur with  improper specimen collection/handling, submission of specimen other than nasopharyngeal swab, presence of viral mutation(s) within the areas targeted by this assay, and inadequate number of viral copies (<131 copies/mL). A negative result must be combined with clinical observations, patient history, and epidemiological information. The expected result is Negative. Fact Sheet for Patients:  PinkCheek.be Fact Sheet for Healthcare Providers:  GravelBags.it This test is not yet ap proved or cleared by the [place] FDA and  has been authorized for detection and/or diagnosis of SARS-CoV-2 by FDA under an Emergency Use Authorization (EUA). This EUA will remain  in effect (meaning this test can be used) for the duration of the COVID-19 declaration under Section 564(b)(1) of the Act, 21 U.S.C. section 360bbb-3(b)(1), unless the authorization is terminated or revoked sooner.    Influenza A by PCR NEGATIVE NEGATIVE   Influenza B by PCR NEGATIVE NEGATIVE    Comment: (NOTE) The Xpert Xpress SARS-CoV-2/FLU/RSV assay is intended as an aid in  the  diagnosis of influenza from Nasopharyngeal swab specimens and  should not be used as a sole basis for treatment. Nasal washings and  aspirates are unacceptable for Xpert Xpress SARS-CoV-2/FLU/RSV  testing. Fact Sheet for Patients: PinkCheek.be Fact Sheet for Healthcare Providers: GravelBags.it This test is not yet approved or cleared by the [place] FDA and  has been authorized for detection and/or diagnosis of SARS-CoV-2 by  FDA under an Emergency Use Authorization (EUA). This EUA will remain  in effect (meaning this test can be used) for the duration of the  Covid-19 declaration under Section 564(b)(1) of the Act, 21  U.S.C. section 360bbb-3(b)(1), unless the authorization is  terminated or revoked. Performed at [facility], [address], [place], [place] [address]     Medications:  Current Facility-Administered Medications  Medication Dose Route Frequency Provider Last Rate Last Admin  . OLANZapine zydis (ZYPREXA) disintegrating tablet 10 mg  10 mg Oral Q8H PRN Pollina, Gwenyth Allegra, MD       And  . ziprasidone (GEODON) injection 20 mg  20 mg Intramuscular PRN Pollina, Gwenyth Allegra, MD       Current Outpatient Medications  Medication Sig Dispense Refill  . traZODone (DESYREL) 100 MG tablet Take 1 tablet (100 mg total) by mouth at bedtime as needed and may repeat dose one time if needed  for sleep. (Patient not taking: Reported on 10/24/2019) 30 tablet 0    Musculoskeletal: Strength & Muscle Tone: within normal limits Gait & Station: normal Patient leans: N/A  Psychiatric Specialty Exam: Physical Exam  Nursing note and vitals reviewed. Constitutional: He is oriented to person, place, and time. He appears well-developed and well-nourished.  Cardiovascular: Normal rate.  Respiratory: Effort normal.  Musculoskeletal:        General: Normal range of motion.  Neurological: He is alert and oriented to  person, place, and time.  Skin: Skin is warm.    Review of Systems  Constitutional: Negative.   HENT: Negative.   Eyes: Negative.   Respiratory: Negative.   Cardiovascular: Negative.   Gastrointestinal: Negative.   Genitourinary: Negative.   Musculoskeletal: Negative.   Skin: Negative.   Neurological: Negative.   Psychiatric/Behavioral: The patient is nervous/anxious.     Blood pressure 124/87, pulse (!) 108, temperature 98.5 F (36.9 C), temperature source Oral, resp. rate 18, SpO2 97 %.There is no height or weight on file to calculate BMI.  General Appearance: Casual  Eye Contact:  Good  Speech:  Clear and Coherent and Normal Rate  Volume:  Normal  Mood:  Anxious  Affect:  Congruent  Thought Process:  Coherent and Descriptions of Associations: Intact  Orientation:  Full (Time, Place, and Person)  Thought Content:  WDL  Suicidal Thoughts:  No  Homicidal Thoughts:  No  Memory:  Immediate;   Good Recent;   Good Remote;   Good  Judgement:  Fair  Insight:  Fair  Psychomotor Activity:  Normal  Concentration:  Concentration: Good  Recall:  Good  Fund of Knowledge:  Good  Language:  Good  Akathisia:  No  Handed:  Right  AIMS (if indicated):     Assets:  Communication Skills Desire for Improvement Physical Health  ADL's:  Intact  Cognition:  WNL  Sleep:        Treatment Plan Summary: Offered [employer] and patient refused  TTS staff provided other options for residential substance abuse treatment  Disposition: No evidence of imminent risk to self or others at present.   Patient does not meet criteria for psychiatric inpatient admission.  This service was provided via telemedicine using a 2-way, interactive audio and video technology.  Names of all persons participating in this telemedicine service and their role in this encounter. Name: [name] Role: Patient  Name: Reola Calkins NP Role: Provider  Name:  Role:   Name:  Role:     Maryfrances Bunnell, FNP 10/24/2019 1:17 PM

## 2019-10-24 NOTE — ED Provider Notes (Signed)
[name] EMERGENCY DEPARTMENT Provider Note   CSN: [id number] Arrival date & time: 10/24/19  0021     History Chief Complaint  Patient presents with  . Suicidal    Mark Hammond is a 29 y.o. male.  Patient presents to the emergency department for evaluation of suicidal ideation.  Patient endorses feeling unsafe at home.  A reports polysubstance drug abuse including alcohol abuse.  He feels that he is unsafe because he has been contemplating overdosing.        Past Medical History:  Diagnosis Date  . Bipolar 1 disorder (HCC)   . Dental abscess   . Depression   . Schizo affective schizophrenia [facility])     Patient Active Problem List   Diagnosis Date Noted  . 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 Alcohol use disorder, severe, dependence (HCC) 08/24/2019  . 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 MDD (major depressive disorder) 03/06/2019    History reviewed. No pertinent surgical history.     Family History  Problem Relation Age of Onset  . Stroke Other     Social History   Tobacco Use  . Smoking status: Current Every Day Smoker    Packs/day: 1.00    Types: Cigarettes  . Smokeless tobacco: Never Used  Substance Use Topics  . Alcohol use: Yes    Comment: Drinks 3-4 times a week. Last drink: 2 days  . Drug use: Not Currently    Comment: last used meth 1 week ago    Home Medications Prior to Admission medications   Medication Sig Start Date End Date Taking? Authorizing Provider  traZODone (DESYREL) 100 MG tablet Take 1 tablet (100 mg total) by mouth at bedtime as needed and may repeat dose one time if needed for sleep. 04/04/19   Emi Holes, PA-C    Allergies    Codeine  Review of Systems   Review of Systems  Psychiatric/Behavioral: Positive for suicidal ideas.  All other systems reviewed and are negative.   Physical Exam Updated Vital Signs BP 124/87 (BP Location: Right Arm)    Pulse (!) 108   Temp 98.5 F (36.9 C) (Oral)   Resp 18   SpO2 97%   Physical Exam Vitals and nursing note reviewed.  Constitutional:      General: He is not in acute distress.    Appearance: Normal appearance. He is well-developed.  HENT:     Head: Normocephalic and atraumatic.     Right Ear: Hearing normal.     Left Ear: Hearing normal.     Nose: Nose normal.  Eyes:     Conjunctiva/sclera: Conjunctivae normal.     Pupils: Pupils are equal, round, and reactive to light.  Cardiovascular:     Rate and Rhythm: Regular rhythm.     Heart sounds: S1 normal and S2 normal. No murmur. No friction rub. No gallop.   Pulmonary:     Effort: Pulmonary effort is normal. No respiratory distress.     Breath sounds: Normal breath sounds.  Chest:     Chest wall: No tenderness.  Abdominal:     General: Bowel sounds are normal.     Palpations: Abdomen is soft.     Tenderness: There is no abdominal tenderness. There is no guarding or rebound. Negative signs include Murphy's sign and McBurney's sign.     Hernia: No hernia is present.  Musculoskeletal:        General: Normal range of motion.  Cervical back: Normal range of motion and neck supple.  Skin:    General: Skin is warm and dry.     Findings: No rash.  Neurological:     Mental Status: He is alert and oriented to person, place, and time.     GCS: GCS eye subscore is 4. GCS verbal subscore is 5. GCS motor subscore is 6.     Cranial Nerves: No cranial nerve deficit.     Sensory: No sensory deficit.     Coordination: Coordination normal.  Psychiatric:        Speech: Speech normal.        Behavior: Behavior is withdrawn.        Thought Content: Thought content includes suicidal ideation.     ED Results / Procedures / Treatments   Labs (all labs ordered are listed, but only abnormal results are displayed) Labs Reviewed  COMPREHENSIVE METABOLIC PANEL - Abnormal; Notable for the following components:      Result Value   Glucose,  Bld 105 (*)    AST 86 (*)    ALT 57 (*)    All other components within normal limits  ETHANOL - Abnormal; Notable for the following components:   Alcohol, Ethyl (B) 126 (*)    All other components within normal limits  SALICYLATE LEVEL - Abnormal; Notable for the following components:   Salicylate Lvl <8.6 (*)    All other components within normal limits  ACETAMINOPHEN LEVEL - Abnormal; Notable for the following components:   Acetaminophen (Tylenol), Serum <10 (*)    All other components within normal limits  CBC - Abnormal; Notable for the following components:   WBC 10.9 (*)    Hemoglobin 17.6 (*)    MCHC 36.2 (*)    All other components within normal limits  RAPID URINE DRUG SCREEN, HOSP PERFORMED - Abnormal; Notable for the following components:   Cocaine POSITIVE (*)    Tetrahydrocannabinol POSITIVE (*)    All other components within normal limits  RESPIRATORY PANEL BY RT PCR (FLU A&B, COVID)    EKG None  Radiology No results found.  Procedures Procedures (including critical care time)  Medications Ordered in ED Medications - No data to display  ED Course  I have reviewed the triage vital signs and the nursing notes.  Pertinent labs & imaging results that were available during my care of the patient were reviewed by me and considered in my medical decision making (see chart for details).    MDM Rules/Calculators/A&P                      Patient with history of schizoaffective schizophrenia and polysubstance drug abuse presents to the emergency department stating that he is a behavioral health evaluation because of hallucinations and suicidal ideation.  Patient will require psychiatric eval. Final Clinical Impression(s) / ED Diagnoses Final diagnoses:  Suicidal ideation    Rx / DC Orders ED Discharge Orders    None       Donnalyn Juran, Gwenyth Allegra, MD 10/24/19 [phone]

## 2019-10-24 NOTE — ED Notes (Signed)
After TTS- pt stated that he did not want to go to [facility] when it was offered as a possibility for drig treatment. Pt states he has a court date on 11/01/2019

## 2019-10-24 NOTE — ED Notes (Signed)
Pt's belongings placed in locker #4 in purple.
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
## 2019-10-24 NOTE — ED Triage Notes (Signed)
Patient reports suicidal ideation with hallucinations this week , he did not disclose his plan of suicide at triage ,  patient added low back pain / body aches this evening .

## 2019-10-27 ENCOUNTER — Emergency Department (HOSPITAL_COMMUNITY)
Admission: EM | Admit: 2019-10-27 | Discharge: 2019-10-28 | Disposition: A | Discharge status: Other Acute Inpatient Hospital | Payer: Self-pay | Attending: Emergency Medicine | Admitting: Emergency Medicine

## 2019-10-27 ENCOUNTER — Other Ambulatory Visit: Payer: Self-pay

## 2019-10-27 ENCOUNTER — Encounter (HOSPITAL_COMMUNITY): Payer: Self-pay | Admitting: Emergency Medicine

## 2019-10-27 DIAGNOSIS — Z20822 Contact with and (suspected) exposure to covid-19: Secondary | ICD-10-CM | POA: Insufficient documentation

## 2019-10-27 DIAGNOSIS — F1020 Alcohol dependence, uncomplicated: Secondary | ICD-10-CM | POA: Insufficient documentation

## 2019-10-27 DIAGNOSIS — F250 Schizoaffective disorder, bipolar type: Secondary | ICD-10-CM | POA: Insufficient documentation

## 2019-10-27 DIAGNOSIS — F1420 Cocaine dependence, uncomplicated: Secondary | ICD-10-CM | POA: Insufficient documentation

## 2019-10-27 DIAGNOSIS — F17210 Nicotine dependence, cigarettes, uncomplicated: Secondary | ICD-10-CM | POA: Insufficient documentation

## 2019-10-27 DIAGNOSIS — T50992A Poisoning by other drugs, medicaments and biological substances, intentional self-harm, initial encounter: Secondary | ICD-10-CM | POA: Insufficient documentation

## 2019-10-27 DIAGNOSIS — T50902A Poisoning by unspecified drugs, medicaments and biological substances, intentional self-harm, initial encounter: Secondary | ICD-10-CM

## 2019-10-27 DIAGNOSIS — F1220 Cannabis dependence, uncomplicated: Secondary | ICD-10-CM | POA: Insufficient documentation

## 2019-10-27 LAB — RAPID URINE DRUG SCREEN, HOSP PERFORMED
Amphetamines: NOT DETECTED
Barbiturates: NOT DETECTED
Benzodiazepines: NOT DETECTED
Cocaine: POSITIVE — AB
Opiates: NOT DETECTED
Tetrahydrocannabinol: NOT DETECTED

## 2019-10-27 LAB — RESPIRATORY PANEL BY RT PCR (FLU A&B, COVID)
Influenza A by PCR: NEGATIVE
Influenza B by PCR: NEGATIVE
SARS Coronavirus 2 by RT PCR: NEGATIVE

## 2019-10-27 LAB — POC SARS CORONAVIRUS 2 AG -  ED: SARS Coronavirus 2 Ag: NEGATIVE

## 2019-10-27 MED ORDER — NALOXONE HCL 2 MG/2ML IJ SOSY
1.0000 mg | PREFILLED_SYRINGE | Freq: Once | INTRAMUSCULAR | Status: AC
  Administered 2019-10-27: 1 mg via INTRAVENOUS
  Filled 2019-10-27: qty 2

## 2019-10-27 NOTE — Progress Notes (Signed)
I revisited pt after taking care of another matter. He said he remembers taking something that he described as a 1/2 of 10 of cocaine. He said the next thing he knew he woke up after ambulance was called and he was told he passed out. He said he didn't want to live and when in further discussion, he said he doesn't thinks he doesn't want to live until something happens him like last night. He said he happened quickly. He said he wants to get help. He said he thinks he can leave the drugs alone but he has to have alcohol which leads to other wanting more drugs. He said he didn't want to live after he and his girlfriend broke up a couple weeks ago. Pt said he looks at [place] (hospital staff) and see that we have it all together and says how easy it is for [place] and he said he gets a little jealous. We talked about how his life could be w/o drugs or alcohol and that it is possible for him to do.  He said he doesn't have a place to live and he doesn't family and that it is a little scary.  Pt was appreciative of visit and encouragement. I invited him to ask for a Chaplain if we can provide further assistance. Please page [phone] if needed. Chaplain Elmarie Shiley, [facility]   10/27/19 1856  Clinical Encounter Type  Visited With Patient

## 2019-10-27 NOTE — ED Notes (Signed)
Pt provided sprite and sandwich. Pt promptly had an episode of emesis.

## 2019-10-27 NOTE — ED Triage Notes (Addendum)
Pt BIB [facility] for drug overdose reported by bystanders. Pt was found to be unresponsive with pinpoint pupils and agonal breathing per EMS on arrival. Pt is homeless and was seen by a friend to be stumbling and unresponsive. Pt states that he uses crack and ETOH. EMS gave 1 mg Narcan IN and pt became responsive with emesis. Pt was given 4 mg Zofran IV for nausea. Pt arrives sleepy with vomiting. He is in no distress.
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
## 2019-10-27 NOTE — ED Triage Notes (Signed)
Pt O2 dropped to 88%. Pt awakened and sats returned to 95% RA. Dr. Freida Busman notified

## 2019-10-27 NOTE — ED Provider Notes (Signed)
[facility] DEPT Provider Note   CSN: [id number] Arrival date & time: 10/27/19  1628     History Chief Complaint  Patient presents with  . Drug Overdose    Mark Hammond is a 29 y.o. male.  29 year old male presents with emesis after receiving Narcan.  Patient has a long history of polysubstance abuse and admits to drinking alcohol, use methamphetamines.  Was found today with staggering gait and pinpoint pupils.  EMS was called and gave patient Narcan which greatly improve the patient's level of consciousness.  He then had emesis afterwards.  Patient states at this time feels tremulous and has body aches.  No abdominal discomfort.  No fever or chills.        Past Medical History:  Diagnosis Date  . Bipolar 1 disorder ([place])   . Dental abscess   . Depression   . Schizo affective schizophrenia [facility])     Patient Active Problem List   Diagnosis Date Noted  . Substance induced mood disorder ([place]) 10/24/2019  . Methamphetamine use disorder, severe ([place]) 08/24/2019  . Alcohol use disorder, severe, dependence ([place]) 08/24/2019  . 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 MDD (major depressive disorder) 03/06/2019    No past surgical history on file.     Family History  Problem Relation Age of Onset  . Stroke Other     Social History   Tobacco Use  . Smoking status: Current Every Day Smoker    Packs/day: 1.00    Types: Cigarettes  . Smokeless tobacco: Never Used  Substance Use Topics  . Alcohol use: Yes    Comment: Drinks 3-4 times a week. Last drink: 2 days  . Drug use: Not Currently    Comment: last used meth 1 week ago    Home Medications Prior to Admission medications   Medication Sig Start Date End Date Taking? Authorizing Provider  traZODone (DESYREL) 100 MG tablet Take 1 tablet (100 mg total) by mouth at bedtime as needed and may repeat dose one time if needed for sleep. Patient not taking: Reported on  10/24/2019 04/04/19   Frederica Kuster, PA-C    Allergies    Codeine  Review of Systems   Review of Systems  All other systems reviewed and are negative.   Physical Exam Updated Vital Signs BP 117/78 (BP Location: Right Arm)   Pulse 88   Temp 97.8 F (36.6 C) (Oral)   Resp 16   SpO2 95%   Physical Exam Vitals and nursing note reviewed.  Constitutional:      General: He is not in acute distress.    Appearance: Normal appearance. He is well-developed. He is not toxic-appearing.  HENT:     Head: Normocephalic and atraumatic.  Eyes:     General: Lids are normal.     Conjunctiva/sclera: Conjunctivae normal.     Pupils: Pupils are equal, round, and reactive to light.  Neck:     Thyroid: No thyroid mass.     Trachea: No tracheal deviation.  Cardiovascular:     Rate and Rhythm: Normal rate and regular rhythm.     Heart sounds: Normal heart sounds. No murmur. No gallop.   Pulmonary:     Effort: Pulmonary effort is normal. No respiratory distress.     Breath sounds: Normal breath sounds. No stridor. No decreased breath sounds, wheezing, rhonchi or rales.  Abdominal:     General: Bowel sounds are normal. There is no distension.  Palpations: Abdomen is soft.     Tenderness: There is no abdominal tenderness. There is no rebound.  Musculoskeletal:        General: No tenderness. Normal range of motion.     Cervical back: Normal range of motion and neck supple.  Skin:    General: Skin is warm and dry.     Findings: No abrasion or rash.  Neurological:     Mental Status: He is alert and oriented to person, place, and time.     GCS: GCS eye subscore is 4. GCS verbal subscore is 5. GCS motor subscore is 6.     Cranial Nerves: No cranial nerve deficit.     Sensory: No sensory deficit.  Psychiatric:        Speech: Speech normal.        Behavior: Behavior normal.     ED Results / Procedures / Treatments   Labs (all labs ordered are listed, but only abnormal results are  displayed) Labs Reviewed - No data to display  EKG None  Radiology No results found.  Procedures Procedures (including critical care time)  Medications Ordered in ED Medications - No data to display  ED Course  I have reviewed the triage vital signs and the nursing notes.  Pertinent labs & imaging results that were available during my care of the patient were reviewed by me and considered in my medical decision making (see chart for details).    MDM Rules/Calculators/A&P                     Patient required additional dose of Narcan here and he is now awake and alert x4.  Patient with questionable suicidal ideations as patient expresses harm to hurt himself without definitive plan.  Patient states he is very frustrated with his substance abuse.  Suspect there is secondary gain here.  We will have TTS see.  8:42 PM Patient seen by TTS and they accepted the patient in transfer over to behavioral health pending Covid test as well as a UDS Final Clinical Impression(s) / ED Diagnoses Final diagnoses:  None    Rx / DC Orders ED Discharge Orders    None       Lorre Nick, MD 10/27/19 2042

## 2019-10-27 NOTE — Progress Notes (Signed)
Pt was lying down w/his jacket over his eyes when I saw him in the hallway. He said he wants help for his addiction. When asked he said he had not spoken with a doctor yet. He said he just wants help. I told him I would let staff know he wants help.  Please page if additional assistance is needed. Chaplain Elmarie Shiley, MDiv   10/27/19 1800  Clinical Encounter Type  Visited With Patient

## 2019-10-28 ENCOUNTER — Encounter (HOSPITAL_COMMUNITY): Payer: Self-pay | Admitting: Psychiatry

## 2019-10-28 ENCOUNTER — Inpatient Hospital Stay (HOSPITAL_COMMUNITY)
Admission: AD | Admit: 2019-10-28 | Discharge: 2019-10-30 | DRG: 885 | Disposition: A | Discharge status: Home / Self Care | Payer: Federal, State, Local not specified - Other | Source: Intra-hospital | Attending: Psychiatry | Admitting: Psychiatry

## 2019-10-28 DIAGNOSIS — R945 Abnormal results of liver function studies: Secondary | ICD-10-CM | POA: Diagnosis present

## 2019-10-28 DIAGNOSIS — Z653 Problems related to other legal circumstances: Secondary | ICD-10-CM

## 2019-10-28 DIAGNOSIS — F1120 Opioid dependence, uncomplicated: Secondary | ICD-10-CM | POA: Diagnosis not present

## 2019-10-28 DIAGNOSIS — F250 Schizoaffective disorder, bipolar type: Principal | ICD-10-CM | POA: Diagnosis present

## 2019-10-28 DIAGNOSIS — Z818 Family history of other mental and behavioral disorders: Secondary | ICD-10-CM | POA: Diagnosis not present

## 2019-10-28 DIAGNOSIS — Z560 Unemployment, unspecified: Secondary | ICD-10-CM

## 2019-10-28 DIAGNOSIS — F1920 Other psychoactive substance dependence, uncomplicated: Secondary | ICD-10-CM

## 2019-10-28 DIAGNOSIS — Z823 Family history of stroke: Secondary | ICD-10-CM

## 2019-10-28 DIAGNOSIS — F17210 Nicotine dependence, cigarettes, uncomplicated: Secondary | ICD-10-CM | POA: Diagnosis present

## 2019-10-28 DIAGNOSIS — F1123 Opioid dependence with withdrawal: Secondary | ICD-10-CM | POA: Diagnosis present

## 2019-10-28 DIAGNOSIS — F1994 Other psychoactive substance use, unspecified with psychoactive substance-induced mood disorder: Secondary | ICD-10-CM | POA: Diagnosis not present

## 2019-10-28 DIAGNOSIS — Z885 Allergy status to narcotic agent status: Secondary | ICD-10-CM | POA: Diagnosis not present

## 2019-10-28 DIAGNOSIS — F5105 Insomnia due to other mental disorder: Secondary | ICD-10-CM | POA: Diagnosis present

## 2019-10-28 DIAGNOSIS — R45851 Suicidal ideations: Secondary | ICD-10-CM | POA: Diagnosis present

## 2019-10-28 DIAGNOSIS — Z590 Homelessness: Secondary | ICD-10-CM

## 2019-10-28 MED ORDER — CARIPRAZINE HCL 1.5 MG PO CAPS
1.5000 mg | ORAL_CAPSULE | Freq: Every day | ORAL | Status: DC
  Administered 2019-10-29: 1.5 mg via ORAL
  Filled 2019-10-28 (×2): qty 1

## 2019-10-28 MED ORDER — ADULT MULTIVITAMIN W/MINERALS CH
1.0000 | ORAL_TABLET | Freq: Every day | ORAL | Status: DC
  Administered 2019-10-28 – 2019-10-30 (×3): 1 via ORAL
  Filled 2019-10-28 (×4): qty 1

## 2019-10-28 MED ORDER — ADULT MULTIVITAMIN W/MINERALS CH
1.0000 | ORAL_TABLET | Freq: Every day | ORAL | Status: DC
  Administered 2019-10-28: 1 via ORAL
  Filled 2019-10-28 (×3): qty 1

## 2019-10-28 MED ORDER — ENSURE ENLIVE PO LIQD
237.0000 mL | Freq: Two times a day (BID) | ORAL | Status: DC
  Administered 2019-10-28 – 2019-10-30 (×3): 237 mL via ORAL

## 2019-10-28 MED ORDER — TRAZODONE HCL 50 MG PO TABS
50.0000 mg | ORAL_TABLET | Freq: Every evening | ORAL | Status: DC | PRN
  Administered 2019-10-28: 50 mg via ORAL
  Filled 2019-10-28: qty 1

## 2019-10-28 MED ORDER — ALUM & MAG HYDROXIDE-SIMETH 200-200-20 MG/5ML PO SUSP: 30.0000 mL | ORAL | Status: DC | PRN

## 2019-10-28 MED ORDER — HYDROXYZINE HCL 25 MG PO TABS: 25.0000 mg | ORAL_TABLET | Freq: Three times a day (TID) | ORAL | Status: DC | PRN

## 2019-10-28 MED ORDER — THIAMINE HCL 100 MG PO TABS
100.0000 mg | ORAL_TABLET | Freq: Every day | ORAL | Status: DC
  Administered 2019-10-29 – 2019-10-30 (×2): 100 mg via ORAL
  Filled 2019-10-28 (×3): qty 1

## 2019-10-28 MED ORDER — LORAZEPAM 1 MG PO TABS: 1.0000 mg | ORAL_TABLET | Freq: Four times a day (QID) | ORAL | Status: DC | PRN

## 2019-10-28 MED ORDER — THIAMINE HCL 100 MG/ML IJ SOLN
100.0000 mg | Freq: Once | INTRAMUSCULAR | Status: AC
  Administered 2019-10-28: 100 mg via INTRAMUSCULAR
  Filled 2019-10-28: qty 2

## 2019-10-28 MED ORDER — HYDROXYZINE HCL 25 MG PO TABS
25.0000 mg | ORAL_TABLET | Freq: Four times a day (QID) | ORAL | Status: DC | PRN
  Filled 2019-10-28: qty 1

## 2019-10-28 MED ORDER — FLUOXETINE HCL 10 MG PO CAPS
10.0000 mg | ORAL_CAPSULE | Freq: Every day | ORAL | Status: DC
  Administered 2019-10-28 – 2019-10-30 (×3): 10 mg via ORAL
  Filled 2019-10-28 (×4): qty 1

## 2019-10-28 MED ORDER — LOPERAMIDE HCL 2 MG PO CAPS: 2.0000 mg | ORAL_CAPSULE | ORAL | Status: DC | PRN

## 2019-10-28 MED ORDER — TRAZODONE HCL 100 MG PO TABS: 100.0000 mg | ORAL_TABLET | Freq: Every evening | ORAL | Status: DC | PRN

## 2019-10-28 MED ORDER — ONDANSETRON 4 MG PO TBDP: 4.0000 mg | ORAL_TABLET | Freq: Four times a day (QID) | ORAL | Status: DC | PRN

## 2019-10-28 MED ORDER — MAGNESIUM HYDROXIDE 400 MG/5ML PO SUSP: 30.0000 mL | Freq: Every day | ORAL | Status: DC | PRN

## 2019-10-28 MED ORDER — ACETAMINOPHEN 325 MG PO TABS: 650.0000 mg | ORAL_TABLET | Freq: Four times a day (QID) | ORAL | Status: DC | PRN

## 2019-10-28 NOTE — BHH Group Notes (Signed)
LCSW Group Therapy Note 10/28/2019 1:47 PM  Type of Therapy and Topic: Group Therapy: Overcoming Obstacles  Participation Level: Did Not Attend  Description of Group:  In this group patients will be encouraged to explore what they see as obstacles to their own wellness and recovery. They will be guided to discuss their thoughts, feelings, and behaviors related to these obstacles. The group will process together ways to cope with barriers, with attention given to specific choices patients can make. Each patient will be challenged to identify changes they are motivated to make in order to overcome their obstacles. This group will be process-oriented, with patients participating in exploration of their own experiences as well as giving and receiving support and challenge from other group members.  Therapeutic Goals: 1. Patient will identify personal and current obstacles as they relate to admission. 2. Patient will identify barriers that currently interfere with their wellness or overcoming obstacles.  3. Patient will identify feelings, thought process and behaviors related to these barriers. 4. Patient will identify two changes they are willing to make to overcome these obstacles:   Summary of Patient Progress  Invited, chose not to attend.   Therapeutic Modalities:  Cognitive Behavioral Therapy Solution Focused Therapy Motivational Interviewing Relapse Prevention Therapy   Alcario Drought Clinical Social Worker

## 2019-10-28 NOTE — Progress Notes (Signed)
Admission Note:  Patient alert, oriented and ambulatory on admission. Patient admitted for SI with a plan. Patient denies SI/HI on admission. Patient voices AH but not where he is frighten. Patient admission completed. Patient oriented to unit and unit rules. All consents signed. Patient and belongings searched and no contraband found. Patient skin assessed and there was noted bruising to his right forehead and bruising and redness to  bilateral arms. Patient has multiple tattoos and skin is intact. Patient given a meal and fluids. Patient provided support and encouragement. Q 15 minute checks in progress and patient remains safe on unit.

## 2019-10-28 NOTE — [facility] Suicide Risk Assessment (Signed)
[facility] Admission Suicide Risk Assessment   Nursing information obtained from:  Patient Demographic factors:  Male, Unemployed Current Mental Status:  Self-harm thoughts Loss Factors:  Financial problems / change in socioeconomic status Historical Factors:  Prior suicide attempts Risk Reduction Factors:  Living with another person, especially a relative  Total Time spent with patient: 45 minutes Principal Problem: Polysubstance Dependence, Substance Induced Mood Disorder, Substance Induced Psychosis Diagnosis:  Active Problems:   Schizoaffective disorder, bipolar type (HCC)  Subjective Data:   Continued Clinical Symptoms:    The "Alcohol Use Disorders Identification Test", Guidelines for Use in Primary Care, Second Edition.  World Pharmacologist [facility]). Score between 0-7:  no or low risk or alcohol related problems. Score between 8-15:  moderate risk of alcohol related problems. Score between 16-19:  high risk of alcohol related problems. Score 20 or above:  warrants further diagnostic evaluation for alcohol dependence and treatment.   CLINICAL FACTORS:  21, single, homeless, currently unemployed . Reports upcoming court date later this month. Patient had presented to ED on 1/21 reporting worsening substance abuse. At the time was medically cleared and discharged. Returned to ED via EMS after being found unresponsive by bystanders , with agonal breathing and pinpoint pupils, requiring Narcan.  Patient states he has fragmented memories of event and states " last thing I remember was hitting the drug". He reported feeling suicidal recently, with thoughts of overdosing on drugs. He reports he has been feeling depressed over the last few weeks.Reports neuro-vegetative symptoms to include poor sleep, poor appetite, low energy level. He also reports intermittent auditory hallucinations ( hears voice telling him things such as  " do more drugs", " take a shower") , and fleeting visual  hallucinations he describes as shadows . History of prior psychiatric admissions, most recently here at [facility] in June 2020, at the time was admitted for depression, suicidal ideations, substance abuse, was discharged on Vraylar, Prozac, Trazodone. States he has been off his psychiatric medications for about two months  Denies history of suicidal attempts. Attributes depression at least in part to substance abuse . He reports history of substance use disorder. States that his substance of choice had been cocaine , but more recently has been using heroin, methamphetamine intermittently . He also reports he has been drinking regularly/daily , and that alcohol intake has tended to increase over recent weeks. Has been drinking up to 3-4 40 ounce beers per day.  Last used 2 days ago.  1/21 BAL 126, UDS positive for cocaine Denies medical illnesses, Allergic to Codeine , smokes 1/2 PPD  . Denies history of WDL ( or other) seizures .   Dx- Polysubstance Dependence ( Cocaine, Alcohol , Opiates ). 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 Patient reports that medications he had been prescribed at discharge during his prior admission in June were helpful and well tolerated , without side effects ( Vraylar, Trazodone, Prozac).  Start Vraylar 1.5 mgrs QDAY, Prozac 10 mgrs QDAY, Trazodone 50 mgrs QHS PRN  Start Ativan PRN for alcohol WDL symptoms as per CIWA protocol  He is currently not endorsing symptoms of opiate WDL and reports was not using heroin daily. 1/24 UDS negative for opiates . Will not start opiate WDL protocol at this time.  Check HgbA1C, Lipid Panel. EKG to monitor QTc     Musculoskeletal: Strength & Muscle Tone: within normal limits Gait & Station: normal Patient leans:  N/A  Psychiatric Specialty Exam: Physical Exam  Review of Systems no headache, no chest pain, no shortness of breath,  no cough, no vomiting   Blood pressure 122/77, pulse (!) 57, height 5\' 5"  (1.651 m), weight 60.1 kg.Body mass index is 22.05 kg/m.  General Appearance: Fairly Groomed  Eye Contact:  Fair  Speech:  Normal Rate  Volume:  Normal  Mood:  reports mood is improving but still feels depressed, anxious  Affect:  congruent , vaguely anxious  Thought Process:  Linear and Descriptions of Associations: Intact  Orientation:  Full (Time, Place, and Person)  Thought Content:  reports recent but no current hallucinations. Currently does not appear internally preoccupied, no delusions are currently expressed   Suicidal Thoughts:  No denies suicidal or self injurious ideations at this time and contracts for safety on unit , denies homicidal or violent ideations  Homicidal Thoughts:  No  Memory:  recent and remote grossly intact   Judgement:  Fair  Insight:  Fair  Psychomotor Activity:  Normal no current tremors or diaphoresis, presents calm and in no acute distress at this time  Concentration:  Concentration: Good and Attention Span: Good  Recall:  Good  Fund of Knowledge:  Good  Language:  Good  Akathisia:  Negative  Handed:  Right  AIMS (if indicated):     Assets:  Communication Skills Desire for Improvement Resilience  ADL's:  Intact  Cognition:  WNL  Sleep:  Number of Hours: 2.75      COGNITIVE FEATURES THAT CONTRIBUTE TO RISK:  Closed-mindedness and Loss of executive function    SUICIDE RISK:   Moderate:  Frequent suicidal ideation with limited intensity, and duration, some specificity in terms of plans, no associated intent, good self-control, limited dysphoria/symptomatology, some risk factors present, and identifiable protective factors, including available and accessible social support.  PLAN OF CARE: Patient will be admitted to inpatient psychiatric unit for stabilization and safety. Will provide and encourage milieu participation. Provide medication management and maked adjustments  as needed. Will order medication to address withdrawal symptoms if needed .  Will follow daily.    I certify that inpatient services furnished can reasonably be expected to improve the patient's condition.   , MD 10/28/2019, 2:54 PM

## 2019-10-28 NOTE — Progress Notes (Signed)
   10/28/19 2149  Psych Admission Type (Psych Patients Only)  Admission Status Voluntary  Psychosocial Assessment  Patient Complaints Anxiety;Substance abuse  Eye Contact Fair  Facial Expression Anxious  Affect Anxious  Speech Logical/coherent  Interaction Assertive  Motor Activity Other (Comment) (WNL)  Appearance/Hygiene Unremarkable  Behavior Characteristics Cooperative;Anxious  Mood Anxious  Aggressive Behavior  Effect No apparent injury  Thought Process  Coherency WDL  Content WDL  Delusions None reported or observed  Perception WDL  Hallucination None reported or observed  Judgment WDL  Confusion None  Danger to Self  Current suicidal ideation? Denies  Agreement Not to Harm Self Yes  Description of Agreement verbal  Danger to Others  Danger to Others None reported or observed   Pt denies SI, HI, AVH and pain. Pt contracts for safety.

## 2019-10-28 NOTE — Progress Notes (Signed)
NUTRITION ASSESSMENT  Pt identified as at risk on the Malnutrition Screen Tool  INTERVENTION: 1. Supplements: Ensure Enlive po BID, each supplement provides 350 kcal and 20 grams of protein 2. Multivitamin with minerals daily  NUTRITION DIAGNOSIS: Unintentional weight loss related to sub-optimal intake as evidenced by pt report.   Goal: Pt to meet >/= 90% of their estimated nutrition needs.  Monitor:  PO intake  Assessment:  Pt admitted with substance abuse. PTA pt reports using ETOH, crack cocaine, methamphetamines and heroin. Pt has poor appetite. Per weight records, pt has lost 13 lbs since 08/24/19 (8% wt loss x 2 months, significant for time frame).  RD to order Ensure supplements and daily MVI.  Height: Ht Readings from Last 1 Encounters:  10/28/19 5\' 5"  (1.651 m)    Weight: Wt Readings from Last 1 Encounters:  10/28/19 60.1 kg    Weight Hx: Wt Readings from Last 10 Encounters:  10/28/19 60.1 kg  10/07/19 62.6 kg  08/24/19 65.8 kg  03/18/19 59 kg  03/11/19 61.2 kg  03/06/19 61.2 kg  02/22/19 61.2 kg  09/13/17 59 kg  05/02/17 58.1 kg  03/14/17 58.4 kg    BMI:  Body mass index is 22.05 kg/m. Pt meets criteria for normal based on current BMI.  Estimated Nutritional Needs: Kcal: 25-30 kcal/kg Protein: > 1 gram protein/kg Fluid: 1 ml/kcal  Diet Order:  Diet Order            Diet regular Room service appropriate? Yes; Fluid consistency: Thin  Diet effective now             Pt is also offered choice of unit snacks mid-morning and mid-afternoon.  Pt is eating as desired.   Lab results and medications reviewed.   05/14/17, MS, RD, LDN Inpatient Clinical Dietitian Pager: [phone] After Hours Pager: [phone]

## 2019-10-28 NOTE — H&P (Addendum)
Psychiatric Admission Assessment Adult  Patient Identification: Mark Hammond MRN:  [id number] Date of Evaluation:  10/28/2019 Chief Complaint:  Schizoaffective disorder, bipolar type ([place]) [F25.0] Principal Diagnosis: <principal problem not specified> Diagnosis:  Active Problems:   Schizoaffective disorder, bipolar type ([place])   Polysubstance (including opioids) dependence, daily use ([place])  History of Present Illness:  From MD's admission SRA: 28, single, homeless, currently unemployed . Reports upcoming court date later this month. Patient had presented to ED on 1/21 reporting worsening substance abuse. At the time was medically cleared and discharged. Returned to ED via EMS after being found unresponsive by bystanders, with agonal breathing and pinpoint pupils, requiring Narcan.  Patient states he has fragmented memories of event and states " last thing I remember was hitting the drug". He reported feeling suicidal recently, with thoughts of overdosing on drugs. He reports he has been feeling depressed over the last few weeks. Reports neuro-vegetative symptoms to include poor sleep, poor appetite, low energy level. He also reports intermittent auditory hallucinations ( hears voice telling him things such as  " do more drugs", " take a shower") , and fleeting visual hallucinations he describes as shadows . He reports history of substance use disorder. States that his substance of choice had been cocaine , but more recently has been using heroin, methamphetamine intermittently . He also reports he has been drinking regularly/daily , and that alcohol intake has tended to increase over recent weeks. Has been drinking up to 3-4 40 ounce beers per day.  Last used 2 days ago.  1/21 BAL 126, UDS positive for cocaine Denies medical illnesses, Allergic to Codeine , smokes 1/2 PPD  . Denies history of WDL ( or other) seizures .   Associated Signs/Symptoms: Depression Symptoms:  depressed  mood, anhedonia, insomnia, fatigue, suicidal thoughts with specific plan, decreased appetite, (Hypo) Manic Symptoms:  denies Anxiety Symptoms:  Excessive Worry, Psychotic Symptoms:  Hallucinations: Auditory Command:  to do drugs PTSD Symptoms: Negative Total Time spent with patient: 30 minutes  Past Psychiatric History: History of prior psychiatric admissions, most recently here at [facility] in June 2020, at the time was admitted for depression, suicidal ideations, substance abuse, was discharged on Vraylar, Prozac, Trazodone. States he has been off his psychiatric medications for about two months. Denies history of suicidal attempts. Attributes depression at least in part to substance abuse .  Is the patient at risk to self? Yes.    Has the patient been a risk to self in the past 6 months? No.  Has the patient been a risk to self within the distant past? Yes.    Is the patient a risk to others? No.  Has the patient been a risk to others in the past 6 months? No.  Has the patient been a risk to others within the distant past? No.   Prior Inpatient Therapy:   Prior Outpatient Therapy:    Alcohol Screening:   Substance Abuse History in the last 12 months:  Yes.   Consequences of Substance Abuse: Medical Consequences:  overdose requiring Narcan Legal Consequences:  multiple charges (destruction of property, trespassing, open container) with upcoming court date on 29th Family Consequences:  thrown out by girlfriend- homelessness Withdrawal Symptoms:   Cramps Tremors Previous Psychotropic Medications: Yes  Psychological Evaluations: No  Past Medical History:  Past Medical History:  Diagnosis Date  . Bipolar 1 disorder ([place])   . Dental abscess   . Depression   . Schizo affective schizophrenia ([place])    History  reviewed. No pertinent surgical history. Family History:  Family History  Problem Relation Age of Onset  . Stroke Other    Family Psychiatric  History: Family history of  depression. Tobacco Screening:   Social History:  Social History   Substance and Sexual Activity  Alcohol Use Yes   Comment: Drinks 3-4 times a week. Last drink: 2 days     Social History   Substance and Sexual Activity  Drug Use Not Currently   Comment: last used meth 1 week ago    Additional Social History:                           Allergies:   Allergies  Allergen Reactions  . Codeine Other (See Comments)    Patient states parents told him he was allergic at a young age.   Lab Results:  Results for orders placed or performed during the hospital encounter of 10/27/19 (from the past 48 hour(s))  Rapid urine drug screen (hospital performed)     Status: Abnormal   Collection Time: 10/27/19  8:42 PM  Result Value Ref Range   Opiates NONE DETECTED NONE DETECTED   Cocaine POSITIVE (A) NONE DETECTED   Benzodiazepines NONE DETECTED NONE DETECTED   Amphetamines NONE DETECTED NONE DETECTED   Tetrahydrocannabinol NONE DETECTED NONE DETECTED   Barbiturates NONE DETECTED NONE DETECTED    Comment: (NOTE) DRUG SCREEN FOR MEDICAL PURPOSES ONLY.  IF CONFIRMATION IS NEEDED FOR ANY PURPOSE, NOTIFY LAB WITHIN 5 DAYS. LOWEST DETECTABLE LIMITS FOR URINE DRUG SCREEN Drug Class                     Cutoff (ng/mL) Amphetamine and metabolites    1000 Barbiturate and metabolites    200 Benzodiazepine                 885 Tricyclics and metabolites     300 Opiates and metabolites        300 Cocaine and metabolites        300 THC                            50 Performed at [facility], [place] [address], [place], [place] [address]   Respiratory Panel by RT PCR (Flu A&B, Covid) - Nasopharyngeal Swab     Status: None   Collection Time: 10/27/19  9:05 PM   Specimen: Nasopharyngeal Swab  Result Value Ref Range   SARS Coronavirus 2 by RT PCR NEGATIVE NEGATIVE    Comment: (NOTE) SARS-CoV-2 target nucleic acids are NOT DETECTED. The SARS-CoV-2 RNA is generally  detectable in upper respiratoy specimens during the acute phase of infection. The lowest concentration of SARS-CoV-2 viral copies this assay can detect is 131 copies/mL. A negative result does not preclude SARS-Cov-2 infection and should not be used as the sole basis for treatment or other patient management decisions. A negative result may occur with  improper specimen collection/handling, submission of specimen other than nasopharyngeal swab, presence of viral mutation(s) within the areas targeted by this assay, and inadequate number of viral copies (<131 copies/mL). A negative result must be combined with clinical observations, patient history, and epidemiological information. The expected result is Negative. Fact Sheet for Patients:  PinkCheek.be Fact Sheet for Healthcare Providers:  GravelBags.it This test is not yet ap proved or cleared by the [place] and  has been authorized for  detection and/or diagnosis of SARS-CoV-2 by FDA under an Emergency Use Authorization (EUA). This EUA will remain  in effect (meaning this test can be used) for the duration of the COVID-19 declaration under Section 564(b)(1) of the Act, 21 U.S.C. section 360bbb-3(b)(1), unless the authorization is terminated or revoked sooner.    Influenza A by PCR NEGATIVE NEGATIVE   Influenza B by PCR NEGATIVE NEGATIVE    Comment: (NOTE) The Xpert Xpress SARS-CoV-2/FLU/RSV assay is intended as an aid in  the diagnosis of influenza from Nasopharyngeal swab specimens and  should not be used as a sole basis for treatment. Nasal washings and  aspirates are unacceptable for Xpert Xpress SARS-CoV-2/FLU/RSV  testing. Fact Sheet for Patients: PinkCheek.be Fact Sheet for Healthcare Providers: GravelBags.it This test is not yet approved or cleared by the [place] FDA and  has been authorized for  detection and/or diagnosis of SARS-CoV-2 by  FDA under an Emergency Use Authorization (EUA). This EUA will remain  in effect (meaning this test can be used) for the duration of the  Covid-19 declaration under Section 564(b)(1) of the Act, 21  U.S.C. section 360bbb-3(b)(1), unless the authorization is  terminated or revoked. Performed at [facility], [place] [address], [place], [place] [address]   POC SARS Coronavirus 2 Ag-ED - Nasal Swab (BD Veritor Kit)     Status: None   Collection Time: 10/27/19  9:28 PM  Result Value Ref Range   SARS Coronavirus 2 Ag NEGATIVE NEGATIVE    Comment: (NOTE) SARS-CoV-2 antigen NOT DETECTED.  Negative results are presumptive.  Negative results do not preclude SARS-CoV-2 infection and should not be used as the sole basis for treatment or other patient management decisions, including infection  control decisions, particularly in the presence of clinical signs and  symptoms consistent with COVID-19, or in those who have been in contact with the virus.  Negative results must be combined with clinical observations, patient history, and epidemiological information. The expected result is Negative. Fact Sheet for Patients: PodPark.tn Fact Sheet for Healthcare Providers: GiftContent.is This test is not yet approved or cleared by the [place] FDA and  has been authorized for detection and/or diagnosis of SARS-CoV-2 by FDA under an Emergency Use Authorization (EUA).  This EUA will remain in effect (meaning this test can be used) for the duration of  the COVID-19 de claration under Section 564(b)(1) of the Act, 21 U.S.C. section 360bbb-3(b)(1), unless the authorization is terminated or revoked sooner.     Blood Alcohol level:  Lab Results  Component Value Date   ETH 126 (H) 10/24/2019   ETH 208 (H) 19/14/7829    Metabolic Disorder Labs:  Lab Results  Component Value Date    HGBA1C 5.1 03/07/2019   MPG 99.67 03/07/2019   No results found for: PROLACTIN Lab Results  Component Value Date   CHOL 131 03/07/2019   TRIG 119 03/07/2019   HDL 66 03/07/2019   CHOLHDL 2.0 03/07/2019   VLDL 24 03/07/2019   LDLCALC 41 03/07/2019    Current Medications: Current Facility-Administered Medications  Medication Dose Route Frequency Provider Last Rate Last Admin  . alum & mag hydroxide-simeth (MAALOX/MYLANTA) 200-200-20 MG/5ML suspension 30 mL  30 mL Oral Q4H PRN Deloria Lair, NP      . Derrill Memo ON 10/29/2019] cariprazine (VRAYLAR) capsule 1.5 mg  1.5 mg Oral Daily Jeyda Siebel A, MD      . feeding supplement (ENSURE ENLIVE) (ENSURE ENLIVE) liquid 237 mL  237 mL Oral  BID BM Zamaya Rapaport, Myer Peer, MD   237 mL at 10/28/19 1018  . FLUoxetine (PROZAC) capsule 10 mg  10 mg Oral Daily Wassim Kirksey, Myer Peer, MD      . hydrOXYzine (ATARAX/VISTARIL) tablet 25 mg  25 mg Oral Q6H PRN Onofrio Klemp, Myer Peer, MD      . LORazepam (ATIVAN) tablet 1 mg  1 mg Oral Q6H PRN Kayleigh Broadwell, Myer Peer, MD      . magnesium hydroxide (MILK OF MAGNESIA) suspension 30 mL  30 mL Oral Daily PRN Deloria Lair, NP      . multivitamin with minerals tablet 1 tablet  1 tablet Oral Daily Guage Efferson A, MD      . thiamine (B-1) injection 100 mg  100 mg Intramuscular Once Sorin Frimpong, Myer Peer, MD      . Derrill Memo ON 10/29/2019] thiamine tablet 100 mg  100 mg Oral Daily Philmore Lepore, Myer Peer, MD      . traZODone (DESYREL) tablet 50 mg  50 mg Oral QHS PRN,MR X 1 Marrissa Dai, Myer Peer, MD       PTA Medications: Medications Prior to Admission  Medication Sig Dispense Refill Last Dose  . traZODone (DESYREL) 100 MG tablet Take 1 tablet (100 mg total) by mouth at bedtime as needed and may repeat dose one time if needed for sleep. (Patient not taking: Reported on 10/24/2019) 30 tablet 0     Musculoskeletal: Strength & Muscle Tone: within normal limits Gait & Station: normal Patient leans: N/A  Psychiatric Specialty Exam: Physical  Exam  Nursing note and vitals reviewed. Constitutional: He is oriented to person, place, and time. He appears well-developed and well-nourished.  Cardiovascular: Normal rate.  Respiratory: Effort normal.  Neurological: He is alert and oriented to person, place, and time.    Review of Systems  Constitutional: Negative.   Respiratory: Negative for cough and shortness of breath.   Cardiovascular: Negative for chest pain.  Gastrointestinal: Positive for nausea. Negative for diarrhea and vomiting.  Neurological: Negative for tremors and headaches.  Psychiatric/Behavioral: Positive for dysphoric mood, hallucinations and sleep disturbance. Negative for agitation, behavioral problems, self-injury and suicidal ideas. The patient is nervous/anxious. The patient is not hyperactive.     Blood pressure 122/77, pulse (!) 57, height '5\' 5"'  (1.651 m), weight 60.1 kg.Body mass index is 22.05 kg/m.  General Appearance: Fairly Groomed  Eye Contact:  Fair  Speech:  Normal Rate  Volume:  Normal  Mood:  Anxious  Affect:  Congruent  Thought Process:  Coherent  Orientation:  Full (Time, Place, and Person)  Thought Content:  Logical  Suicidal Thoughts:  No  Homicidal Thoughts:  No  Memory:  Immediate;   Fair Recent;   Fair Remote;   Fair  Judgement:  Fair  Insight:  Fair  Psychomotor Activity:  Normal  Concentration:  Concentration: Fair and Attention Span: Fair  Recall:  [employer] of Knowledge:  Fair  Language:  Fair  Akathisia:  No  Handed:  Right  AIMS (if indicated):     Assets:  Communication Skills Desire for Improvement Resilience Social Support  ADL's:  Intact  Cognition:  WNL  Sleep:  Number of Hours: 2.75    Treatment Plan Summary: Daily contact with patient to assess and evaluate symptoms and progress in treatment and Medication management   Inpatient hospitalization.  See MD's admission SRA for medication management.  Patient will participate in the therapeutic group  milieu.  Discharge disposition in progress.   Observation Level/Precautions:  15  minute checks  Laboratory:   a1c lipid panel TSH  Psychotherapy:  Group therapy  Medications:  See MAR  Consultations:  PRN  Discharge Concerns:  Safety and stabilization  Estimated LOS: 3-5 days  Other:     Physician Treatment Plan for Primary Diagnosis: <principal problem not specified> Long Term Goal(s): Improvement in symptoms so as ready for discharge  Short Term Goals: Ability to identify changes in lifestyle to reduce recurrence of condition will improve, Ability to verbalize feelings will improve and Ability to disclose and discuss suicidal ideas  Physician Treatment Plan for Secondary Diagnosis: Active Problems:   Schizoaffective disorder, bipolar type ([facility])   Polysubstance (including opioids) dependence, daily use ([place])  Long Term Goal(s): Improvement in symptoms so as ready for discharge  Short Term Goals: Ability to demonstrate self-control will improve and Ability to identify and develop effective coping behaviors will improve  I certify that inpatient services furnished can reasonably be expected to improve the patient's condition.    Connye Burkitt, NP 1/25/20213:53 PM   I have discussed case with NP and have met with patient  Agree with NP note and assessment  28, single, homeless, currently unemployed . Reports upcoming court date later this month. Patient had presented to ED on 1/21 reporting worsening substance abuse. At the time was medically cleared and discharged. Returned to ED via EMS after being found unresponsive by bystanders , with agonal breathing and pinpoint pupils, requiring Narcan.  Patient states he has fragmented memories of event and states " last thing I remember was hitting the drug". He reported feeling suicidal recently, with thoughts of overdosing on drugs. He reports he has been feeling depressed over the last few weeks.Reports neuro-vegetative symptoms to  include poor sleep, poor appetite, low energy level. He also reports intermittent auditory hallucinations ( hears voice telling him things such as  " do more drugs", " take a shower") , and fleeting visual hallucinations he describes as shadows . History of prior psychiatric admissions, most recently here at [facility] in June 2020, at the time was admitted for depression, suicidal ideations, substance abuse, was discharged on Vraylar, Prozac, Trazodone. States he has been off his psychiatric medications for about two months  Denies history of suicidal attempts. Attributes depression at least in part to substance abuse . He reports history of substance use disorder. States that his substance of choice had been cocaine , but more recently has been using heroin, methamphetamine intermittently . He also reports he has been drinking regularly/daily , and that alcohol intake has tended to increase over recent weeks. Has been drinking up to 3-4 40 ounce beers per day.  Last used 2 days ago.  1/21 BAL 126, UDS positive for cocaine Denies medical illnesses, Allergic to Codeine , smokes 1/2 PPD  . Denies history of WDL ( or other) seizures .   Dx- Polysubstance Dependence ( Cocaine, Alcohol , Opiates ). 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 Patient reports that medications he had been prescribed at discharge during his prior admission in June were helpful and well tolerated , without side effects ( Vraylar, Trazodone, Prozac).  Start Vraylar 1.5 mgrs QDAY, Prozac 10 mgrs QDAY, Trazodone 50 mgrs QHS PRN  Start Ativan PRN for alcohol WDL symptoms as per CIWA protocol  He is currently not endorsing symptoms of opiate WDL and reports was not using heroin daily. 1/24 UDS negative for opiates .  Will not start opiate WDL protocol at this time.  Check HgbA1C, Lipid Panel. EKG to monitor QTc

## 2019-10-28 NOTE — Progress Notes (Signed)
Recreation Therapy Notes  Date:  1.25.21 Time: 0930 Location: 300 Hall Group Room  Group Topic: Stress Management  Goal Area(s) Addresses:  Patient will identify positive stress management techniques. Patient will identify benefits of using stress management post d/c.  Intervention: Stress Management  Activity :  Meditation.  LRT played a meditation that focused on the making most of your day and exploring the possibilities each moment holds.  Patients were to listen and follow along as meditation played to engage in group.   Education:  Stress Management, Discharge Planning.   Education Outcome: Acknowledges Education  Clinical Observations/Feedback: Pt did not attend group activity.    Caroll Rancher, LRT/CTRS         Caroll Rancher A 10/28/2019 10:49 AM

## 2019-10-28 NOTE — ED Notes (Signed)
This RN attempted to call report x1  

## 2019-10-28 NOTE — Tx Team (Signed)
Interdisciplinary Treatment and Diagnostic Plan Update  10/28/2019 Time of Session: 9:20am Mark Hammond MRN: [id number]  Principal Diagnosis: <principal problem not specified>  Secondary Diagnoses: Active Problems:   Schizoaffective disorder, bipolar type ([place])   Current Medications:  Current Facility-Administered Medications  Medication Dose Route Frequency Provider Last Rate Last Admin  . acetaminophen (TYLENOL) tablet 650 mg  650 mg Oral Q6H PRN Dixon, [name] M, NP      . alum & mag hydroxide-simeth (MAALOX/MYLANTA) 200-200-20 MG/5ML suspension 30 mL  30 mL Oral Q4H PRN Dixon, [name] M, NP      . feeding supplement (ENSURE ENLIVE) (ENSURE ENLIVE) liquid 237 mL  237 mL Oral BID BM Cobos, Myer Peer, MD   237 mL at 10/28/19 1018  . hydrOXYzine (ATARAX/VISTARIL) tablet 25 mg  25 mg Oral TID PRN Deloria Lair, NP      . magnesium hydroxide (MILK OF MAGNESIA) suspension 30 mL  30 mL Oral Daily PRN Deloria Lair, NP      . multivitamin with minerals tablet 1 tablet  1 tablet Oral Daily Cobos, Myer Peer, MD   1 tablet at 10/28/19 1000  . traZODone (DESYREL) tablet 100 mg  100 mg Oral QHS PRN,MR X 1 Dixon, [name] M, NP       PTA Medications: Medications Prior to Admission  Medication Sig Dispense Refill Last Dose  . traZODone (DESYREL) 100 MG tablet Take 1 tablet (100 mg total) by mouth at bedtime as needed and may repeat dose one time if needed for sleep. (Patient not taking: Reported on 10/24/2019) 30 tablet 0     Patient Stressors: Financial difficulties Occupational concerns Substance abuse  Patient Strengths: Capable of independent living General fund of knowledge Motivation for treatment/growth  Treatment Modalities: Medication Management, Group therapy, Case management,  1 to 1 session with clinician, Psychoeducation, Recreational therapy.   Physician Treatment Plan for Primary Diagnosis: <principal problem not specified> Long Term Goal(s):     Short Term Goals:     Medication Management: Evaluate patient's response, side effects, and tolerance of medication regimen.  Therapeutic Interventions: 1 to 1 sessions, Unit Group sessions and Medication administration.  Evaluation of Outcomes: Not Met  Physician Treatment Plan for Secondary Diagnosis: Active Problems:   Schizoaffective disorder, bipolar type ([place])  Long Term Goal(s):     Short Term Goals:       Medication Management: Evaluate patient's response, side effects, and tolerance of medication regimen.  Therapeutic Interventions: 1 to 1 sessions, Unit Group sessions and Medication administration.  Evaluation of Outcomes: Not Met   RN Treatment Plan for Primary Diagnosis: <principal problem not specified> Long Term Goal(s): Knowledge of disease and therapeutic regimen to maintain health will improve  Short Term Goals: Ability to demonstrate self-control, Ability to participate in decision making will improve, Ability to verbalize feelings will improve, Ability to disclose and discuss suicidal ideas, Ability to identify and develop effective coping behaviors will improve and Compliance with prescribed medications will improve  Medication Management: RN will administer medications as ordered by provider, will assess and evaluate patient's response and provide education to patient for prescribed medication. RN will report any adverse and/or side effects to prescribing provider.  Therapeutic Interventions: 1 on 1 counseling sessions, Psychoeducation, Medication administration, Evaluate responses to treatment, Monitor vital signs and CBGs as ordered, Perform/monitor CIWA, COWS, AIMS and Fall Risk screenings as ordered, Perform wound care treatments as ordered.  Evaluation of Outcomes: Not Met   LCSW Treatment Plan for Primary Diagnosis: <principal  problem not specified> Long Term Goal(s): Safe transition to appropriate next level of care at discharge, Engage patient in therapeutic group  addressing interpersonal concerns.  Short Term Goals: Engage patient in aftercare planning with referrals and resources  Therapeutic Interventions: Assess for all discharge needs, 1 to 1 time with Social worker, Explore available resources and support systems, Assess for adequacy in community support network, Educate family and significant other(s) on suicide prevention, Complete Psychosocial Assessment, Interpersonal group therapy.  Evaluation of Outcomes: Not Met   Progress in Treatment: Attending groups: No. New to unit  Participating in groups: No. Taking medication as prescribed: Yes. Toleration medication: Yes. Family/Significant other contact made: No, will contact:  if patient consents to collateral contacts Patient understands diagnosis: Yes. Discussing patient identified problems/goals with staff: Yes. Medical problems stabilized or resolved: Yes. Denies suicidal/homicidal ideation: Yes. Issues/concerns per patient self-inventory: No. Other:   New problem(s) identified: None   New Short Term/Long Term Goal(s): Detox, medication stabilization, elimination of SI thoughts, development of comprehensive mental wellness plan.    Patient Goals:  "to detox and get the proper meds"  Discharge Plan or Barriers: Patient recently admitted. CSW will continue to follow and assess for appropriate referrals and possible discharge planning.    Reason for Continuation of Hospitalization: Anxiety Depression Medication stabilization Suicidal ideation Withdrawal symptoms  Estimated Length of Stay: 3-5 days   Attendees: Patient: Mark Hammond  10/28/2019 10:26 AM  Physician: Dr. Neita Garnet, MD 10/28/2019 10:26 AM  Nursing: Vladimir Faster.Viona Gilmore, RN 10/28/2019 10:26 AM  RN Care Manager: 10/28/2019 10:26 AM  Social Worker: Radonna Ricker, LCSW 10/28/2019 10:26 AM  Recreational Therapist:  10/28/2019 10:26 AM  Other: Harriett Sine, NP 10/28/2019 10:26 AM  Other:  10/28/2019 10:26 AM  Other: 10/28/2019  10:26 AM    Scribe for Treatment Team: Marylee Floras, [place] 10/28/2019 10:26 AM

## 2019-10-28 NOTE — Tx Team (Signed)
Initial Treatment Plan 10/28/2019 4:34 AM [name] SMM:[id number]    PATIENT STRESSORS: Financial difficulties Occupational concerns Substance abuse   PATIENT STRENGTHS: Capable of independent living General fund of knowledge Motivation for treatment/growth   PATIENT IDENTIFIED PROBLEMS: "Suicide"  :anxiety"  "depression"                 DISCHARGE CRITERIA:  Adequate post-discharge living arrangements Improved stabilization in mood, thinking, and/or behavior Motivation to continue treatment in a less acute level of care  PRELIMINARY DISCHARGE PLAN: Outpatient therapy  PATIENT/FAMILY INVOLVEMENT: This treatment plan has been presented to and reviewed with the patient, Sloan Takagi.  The patient and family have been given the opportunity to ask questions and make suggestions.  Curly Rim, RN 10/28/2019, 4:34 AM

## 2019-10-29 DIAGNOSIS — F250 Schizoaffective disorder, bipolar type: Principal | ICD-10-CM

## 2019-10-29 LAB — HEMOGLOBIN A1C
Hgb A1c MFr Bld: 5.1 % (ref 4.8–5.6)
Mean Plasma Glucose: 99.67 mg/dL

## 2019-10-29 LAB — TSH: TSH: 1.1 u[IU]/mL (ref 0.350–4.500)

## 2019-10-29 LAB — LIPID PANEL
Cholesterol: 134 mg/dL (ref 0–200)
HDL: 60 mg/dL (ref >40)
LDL Cholesterol: 53 mg/dL (ref 0–99)
Total CHOL/HDL Ratio: 2.2 RATIO
Triglycerides: 107 mg/dL (ref <150)
VLDL: 21 mg/dL (ref 0–40)

## 2019-10-29 MED ORDER — CLONIDINE HCL 0.1 MG PO TABS: 0.1000 mg | ORAL_TABLET | Freq: Every day | ORAL | Status: DC

## 2019-10-29 MED ORDER — DICYCLOMINE HCL 20 MG PO TABS: 20.0000 mg | ORAL_TABLET | Freq: Four times a day (QID) | ORAL | Status: DC | PRN

## 2019-10-29 MED ORDER — QUETIAPINE FUMARATE 100 MG PO TABS
100.0000 mg | ORAL_TABLET | Freq: Every day | ORAL | Status: DC
  Administered 2019-10-29: 100 mg via ORAL
  Filled 2019-10-29 (×2): qty 1

## 2019-10-29 MED ORDER — ONDANSETRON 4 MG PO TBDP: 8.0000 mg | ORAL_TABLET | Freq: Three times a day (TID) | ORAL | Status: DC | PRN

## 2019-10-29 MED ORDER — CLONIDINE HCL 0.1 MG PO TABS
0.1000 mg | ORAL_TABLET | ORAL | Status: DC
  Filled 2019-10-29: qty 1

## 2019-10-29 MED ORDER — NAPROXEN 500 MG PO TABS: 500.0000 mg | ORAL_TABLET | Freq: Two times a day (BID) | ORAL | Status: DC | PRN

## 2019-10-29 MED ORDER — CLONIDINE HCL 0.1 MG PO TABS
0.1000 mg | ORAL_TABLET | Freq: Four times a day (QID) | ORAL | Status: DC
  Administered 2019-10-29 – 2019-10-30 (×2): 0.1 mg via ORAL
  Filled 2019-10-29 (×8): qty 1

## 2019-10-29 MED ORDER — LOPERAMIDE HCL 2 MG PO CAPS: 2.0000 mg | ORAL_CAPSULE | ORAL | Status: DC | PRN

## 2019-10-29 MED ORDER — METHOCARBAMOL 500 MG PO TABS: 500.0000 mg | ORAL_TABLET | Freq: Three times a day (TID) | ORAL | Status: DC | PRN

## 2019-10-29 NOTE — Progress Notes (Signed)
Adult Psychoeducational Group Note  Date:  10/29/2019 Time:  9:26 PM  Group Topic/Focus:  Wrap-Up Group:   The focus of this group is to help patients review their daily goal of treatment and discuss progress on daily workbooks.  Participation Level:  Active  Participation Quality:  Appropriate  Affect:  Appropriate  Cognitive:  Appropriate  Insight: Appropriate  Engagement in Group:  Engaged  Modes of Intervention:  Discussion  Additional Comments:  Patient attended group and said that  His day was a 10 out of 10. His goal for to was to discuss his discharge plan with the physician and he did.   Jeral Zick W Darriona Dehaas 10/29/2019, 9:26 PM

## 2019-10-29 NOTE — Progress Notes (Signed)
Patient would like to talk to MD tomorrow 10/30/2019 about discharge home.  Patient stated he is no longer having withdrawal symptoms.  Has court date on Friday 11/01/2019.

## 2019-10-29 NOTE — Progress Notes (Signed)
[facility] MD Progress Note  10/29/2019 1:16 PM Zeeshan Korte  MRN:  [id number] Subjective: Patient is a 29 year old male who presented to the [facility] emergency department on 10/24/2019 with a complaint that addiction was taking over his life.  He had developed suicidal ideation secondary to this and was requesting admission for treatment.  Objective: Patient is seen and examined.  Patient is a 29 year old male with the above-stated past psychiatric history who is seen in follow-up.  He reported feeling better today.  He stated he was not having auditory hallucinations.  He stated his withdrawal symptoms were decreasing.  He denied any suicidal ideation.  He also has a previous psychiatric history significant for schizoaffective disorder; bipolar type.  He is currently on the clonidine detox protocol, fluoxetine, lorazepam for withdrawal symptoms, Vraylar and trazodone.  The patient stated that when he was discharged from the hospital before on Vraylar he was unable to afford this medication, and we discussed possible switching of these medicines.  His vital signs are stable, he is afebrile.  His most recent CIWA was 0 this morning.  He slept 5.25 hours last night.  Review of his laboratories showed elevated liver function enzymes with an AST of 86 and an ALT of 57.  His white blood cell count was mildly elevated on admission at 10.9.  His blood alcohol on admission was 126, cocaine was positive.  No other drugs were in his system.  TSH was normal, hemoglobin A1c was normal.  His EKG showed a sinus bradycardia with normal QTc interval.  Principal Problem: <principal problem not specified> Diagnosis: Active Problems:   Schizoaffective disorder, bipolar type (HCC)   Polysubstance (including opioids) dependence, daily use (HCC)  Total Time spent with patient: 20 minutes  Past Psychiatric History: See admission H&P  Past Medical History:  Past Medical History:  Diagnosis Date  . Bipolar 1  disorder ([place])   . Dental abscess   . Depression   . Schizo affective schizophrenia ([place])    History reviewed. No pertinent surgical history. Family History:  Family History  Problem Relation Age of Onset  . Stroke Other    Family Psychiatric  History: See admission H&P Social History:  Social History   Substance and Sexual Activity  Alcohol Use Yes   Comment: Drinks 3-4 times a week. Last drink: 2 days     Social History   Substance and Sexual Activity  Drug Use Not Currently   Comment: last used meth 1 week ago    Social History   Socioeconomic History  . Marital status: Single    Spouse name: Not on file  . Number of children: Not on file  . Years of education: Not on file  . Highest education level: Not on file  Occupational History  . Not on file  Tobacco Use  . Smoking status: Current Every Day Smoker    Packs/day: 1.00    Types: Cigarettes  . Smokeless tobacco: Never Used  Substance and Sexual Activity  . Alcohol use: Yes    Comment: Drinks 3-4 times a week. Last drink: 2 days  . Drug use: Not Currently    Comment: last used meth 1 week ago  . Sexual activity: Not on file  Other Topics Concern  . Not on file  Social History Narrative  . Not on file   Social Determinants of Health   Financial Resource Strain:   . Difficulty of Paying Living Expenses: Not on file  Food Insecurity:   .  Worried About Programme researcher, broadcasting/film/video in the Last Year: Not on file  . Ran Out of Food in the Last Year: Not on file  Transportation Needs:   . Lack of Transportation (Medical): Not on file  . Lack of Transportation (Non-Medical): Not on file  Physical Activity:   . Days of Exercise per Week: Not on file  . Minutes of Exercise per Session: Not on file  Stress:   . Feeling of Stress : Not on file  Social Connections:   . Frequency of Communication with Friends and Family: Not on file  . Frequency of Social Gatherings with Friends and Family: Not on file  . Attends  Religious Services: Not on file  . Active Member of Clubs or Organizations: Not on file  . Attends Banker Meetings: Not on file  . Marital Status: Not on file   Additional Social History:                         Sleep: Good  Appetite:  Fair  Current Medications: Current Facility-Administered Medications  Medication Dose Route Frequency Provider Last Rate Last Admin  . alum & mag hydroxide-simeth (MAALOX/MYLANTA) 200-200-20 MG/5ML suspension 30 mL  30 mL Oral Q4H PRN Dixon, [name] M, NP      . cloNIDine (CATAPRES) tablet 0.1 mg  0.1 mg Oral QID Antonieta Pert, MD       Followed by  . [START ON 10/31/2019] cloNIDine (CATAPRES) tablet 0.1 mg  0.1 mg Oral BH-qamhs Yolani Vo, Marlane Mingle, MD       Followed by  . [START ON 11/02/2019] cloNIDine (CATAPRES) tablet 0.1 mg  0.1 mg Oral QAC breakfast Antonieta Pert, MD      . dicyclomine (BENTYL) tablet 20 mg  20 mg Oral Q6H PRN Antonieta Pert, MD      . feeding supplement (ENSURE ENLIVE) (ENSURE ENLIVE) liquid 237 mL  237 mL Oral BID BM Cobos, Rockey Situ, MD   237 mL at 10/28/19 1500  . FLUoxetine (PROZAC) capsule 10 mg  10 mg Oral Daily Cobos, Rockey Situ, MD   10 mg at 10/29/19 0753  . hydrOXYzine (ATARAX/VISTARIL) tablet 25 mg  25 mg Oral Q6H PRN Cobos, Rockey Situ, MD      . loperamide (IMODIUM) capsule 2-4 mg  2-4 mg Oral PRN Antonieta Pert, MD      . LORazepam (ATIVAN) tablet 1 mg  1 mg Oral Q6H PRN Cobos, Rockey Situ, MD      . magnesium hydroxide (MILK OF MAGNESIA) suspension 30 mL  30 mL Oral Daily PRN Dixon, [name] M, NP      . methocarbamol (ROBAXIN) tablet 500 mg  500 mg Oral Q8H PRN Antonieta Pert, MD      . multivitamin with minerals tablet 1 tablet  1 tablet Oral Daily Cobos, Rockey Situ, MD   1 tablet at 10/29/19 0753  . naproxen (NAPROSYN) tablet 500 mg  500 mg Oral BID PRN Antonieta Pert, MD      . ondansetron (ZOFRAN-ODT) disintegrating tablet 8 mg  8 mg Oral Q8H PRN Antonieta Pert,  MD      . QUEtiapine (SEROQUEL) tablet 100 mg  100 mg Oral QHS Antonieta Pert, MD      . thiamine tablet 100 mg  100 mg Oral Daily Cobos, Rockey Situ, MD   100 mg at 10/29/19 0753  . traZODone (DESYREL) tablet 50 mg  50  mg Oral QHS PRN,MR X 1 Cobos, Rockey Situ, MD   50 mg at 10/28/19 2047    Lab Results:  Results for orders placed or performed during the hospital encounter of 10/28/19 (from the past 48 hour(s))  Lipid panel     Status: None   Collection Time: 10/29/19  6:27 AM  Result Value Ref Range   Cholesterol 134 0 - 200 mg/dL   Triglycerides 347 <425 mg/dL   HDL 60 >95 mg/dL   Total CHOL/HDL Ratio 2.2 RATIO   VLDL 21 0 - 40 mg/dL   LDL Cholesterol 53 0 - 99 mg/dL    Comment:        Total Cholesterol/HDL:CHD Risk Coronary Heart Disease Risk Table                     Men   Women  1/2 Average Risk   3.4   3.3  Average Risk       5.0   4.4  2 X Average Risk   9.6   7.1  3 X Average Risk  23.4   11.0        Use the calculated Patient Ratio above and the CHD Risk Table to determine the patient's CHD Risk.        ATP III CLASSIFICATION (LDL):  <100     mg/dL   Optimal  638-756  mg/dL   Near or Above                    Optimal  130-159  mg/dL   Borderline  433-295  mg/dL   High  >188     mg/dL   Very High Performed at [facility], [address] [place], [place] [address]   Hemoglobin A1c     Status: None   Collection Time: 10/29/19  6:27 AM  Result Value Ref Range   Hgb A1c MFr Bld 5.1 4.8 - 5.6 %    Comment: (NOTE) Pre diabetes:          5.7%-6.4% Diabetes:              >6.4% Glycemic control for   <7.0% adults with diabetes    Mean Plasma Glucose 99.67 mg/dL    Comment: Performed at [facility], [address] [place], [place] [address]  TSH     Status: None   Collection Time: 10/29/19  6:27 AM  Result Value Ref Range   TSH 1.100 0.350 - 4.500 uIU/mL    Comment: Performed by a 3rd Generation assay with a functional sensitivity of  <=0.01 uIU/mL. Performed at [facility], [address], [place], [place] [address]     Blood Alcohol level:  Lab Results  Component Value Date   ETH 126 (H) 10/24/2019   ETH 208 (H) 10/07/2019    Metabolic Disorder Labs: Lab Results  Component Value Date   HGBA1C 5.1 10/29/2019   MPG 99.67 10/29/2019   MPG 99.67 03/07/2019   No results found for: PROLACTIN Lab Results  Component Value Date   CHOL 134 10/29/2019   TRIG 107 10/29/2019   HDL 60 10/29/2019   CHOLHDL 2.2 10/29/2019   VLDL 21 10/29/2019   LDLCALC 53 10/29/2019   LDLCALC 41 03/07/2019    Physical Findings: AIMS:  , ,  ,  ,    CIWA:  CIWA-Ar Total: 0 COWS:     Musculoskeletal: Strength & Muscle Tone: within normal limits  Gait & Station: normal Patient leans: N/A  Psychiatric Specialty Exam: Physical Exam  Nursing note and vitals reviewed. Constitutional: He is oriented to person, place, and time. He appears well-developed and well-nourished.  HENT:  Head: Normocephalic and atraumatic.  Respiratory: Effort normal.  Neurological: He is alert and oriented to person, place, and time.    Review of Systems  Blood pressure (!) 117/93, pulse 63, height 5\' 5"  (1.651 m), weight 60.1 kg.Body mass index is 22.05 kg/m.  General Appearance: Disheveled  Eye Contact:  Fair  Speech:  Normal Rate  Volume:  Normal  Mood:  Anxious  Affect:  Congruent  Thought Process:  Coherent and Descriptions of Associations: Intact  Orientation:  Full (Time, Place, and Person)  Thought Content:  Logical  Suicidal Thoughts:  No  Homicidal Thoughts:  No  Memory:  Immediate;   Fair Recent;   Fair Remote;   Fair  Judgement:  Intact  Insight:  Fair  Psychomotor Activity:  Increased  Concentration:  Concentration: Fair and Attention Span: Fair  Recall:  of Knowledge:  Fair  Language:  Fair  Akathisia:  Negative  Handed:  Right  AIMS (if indicated):     Assets:  Desire for  Improvement Resilience  ADL's:  Intact  Cognition:  WNL  Sleep:  Number of Hours: 5.25     Treatment Plan Summary: Daily contact with patient to assess and evaluate symptoms and progress in treatment, Medication management and Plan : Patient is seen and examined.  Patient is a 29 year old male with the above-stated past psychiatric history who is seen in follow-up.  Diagnosis: #1 schizoaffective disorder; bipolar type, #2 polysubstance dependence, #3 abnormal liver function enzymes  Patient is seen in follow-up.  He is slowly improving.  He did admit that trying to get 26 was very difficult for him, and we are going to switch him to Seroquel.  Given his sleep disturbance we will start at 100 mg p.o. nightly and titrate during the course the hospitalization.  No other changes in his medications at this time.  With regard to his substance abuse treatment he is not requesting any discharge follow-up at this point.  His plan is to return to his girlfriend's after discharge.  1.  Continue clonidine detox protocol. 2.  Continue fluoxetine 10 mg p.o. daily for depression and anxiety. 3.  Continue hydroxyzine 25 mg p.o. every 6 hours as needed anxiety. 4.  Continue lorazepam 1 mg p.o. every 6 hours as needed a CIWA greater than 10. 5.  Stop Vraylar. 6.  Start Seroquel 100 mg p.o. nightly and titrate for psychosis and mood stability. 7.  Continue thiamine 100 mg p.o. daily for nutritional supplementation. 8.  Continue trazodone 50 mg p.o. nightly as needed insomnia. 9.  Repeat liver function enzymes in a.m. tomorrow. 10.  Disposition planning-in progress.  Leafy Kindle, MD 10/29/2019, 1:16 PM

## 2019-10-29 NOTE — Progress Notes (Signed)
Patient denies SI, HI and AVH this shift.  Patient reports a desire to go into long term rehab for his cocaine use.  Patient had no incidents of behavioral dyscontrol this shift.   Assess patient for safety.  Offer medications as prescribed, engage patient in 1:1 staff talks.   Continue to monitor as planned.  Patient able to contract for safety.

## 2019-10-29 NOTE — Progress Notes (Signed)
Recreation Therapy Notes  Animal-Assisted Activity (AAA) Program Checklist/Progress Notes Patient Eligibility Criteria Checklist & Daily Group note for Rec Tx Intervention  Date: 1.26.21 Time: 1430 Location: [address] Dayroom   AAA/T Program Assumption of Risk Form signed by Patient/ or Parent Legal Guardian  YES   Patient is free of allergies or sever asthma  YES   Patient reports no fear of animals  YES   Patient reports no history of cruelty to animals  YES   Patient understands his/her participation is voluntary  YES  Patient washes hands before animal contact  YES   Patient washes hands after animal contact  YES    Education: Hand Washing, Appropriate Animal Interaction   Education Outcome: Acknowledges understanding/In group clarification offered/Needs additional education.   Clinical Observations/Feedback: Pt did not attend group activity.    Mark Hammond, LRT/CTRS         Mark Hammond 10/29/2019 3:29 PM 

## 2019-10-29 NOTE — Progress Notes (Signed)
   10/29/19 2138  Psych Admission Type (Psych Patients Only)  Admission Status Voluntary  Psychosocial Assessment  Patient Complaints Anxiety  Eye Contact Fair  Facial Expression Anxious  Affect Anxious  Speech Logical/coherent  Interaction Assertive  Motor Activity Other (Comment) (WNL)  Appearance/Hygiene Unremarkable  Behavior Characteristics Cooperative;Anxious  Aggressive Behavior  Effect No apparent injury  Thought Process  Coherency WDL  Content WDL  Delusions None reported or observed  Perception WDL  Hallucination None reported or observed  Judgment WDL  Confusion None  Danger to Self  Current suicidal ideation? Denies  Agreement Not to Harm Self Yes  Description of Agreement verbal  Danger to Others  Danger to Others None reported or observed   Pt denies SI, HI, AVH, pain. Started on clonidine protocol. Denies any withdrawal symptoms. Educated on clonidine and how it relates to HR and blood pressure. Pt contracts for safety.
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
## 2019-10-29 NOTE — [facility] Suicide Risk Assessment (Signed)
[facility] INPATIENT:  Family/Significant Other Suicide Prevention Education  Suicide Prevention Education:  Patient Refusal for Family/Significant Other Suicide Prevention Education: The patient Mark Hammond has refused to provide written consent for family/significant other to be provided Family/Significant Other Suicide Prevention Education during admission and/or prior to discharge.  Physician notified.  SPE completed with patient, as patient refused to consent to family contact. SPI pamphlet provided to pt and pt was encouraged to share information with support network, ask questions, and talk about any concerns relating to SPE. Patient denies access to guns/firearms and verbalized understanding of information provided. Mobile Crisis information also provided to patient.    [name] 10/29/2019, 11:23 AM

## 2019-10-30 MED ORDER — ADULT MULTIVITAMIN W/MINERALS CH: 1.0000 | ORAL_TABLET | Freq: Every day | ORAL | Status: DC

## 2019-10-30 MED ORDER — QUETIAPINE FUMARATE 200 MG PO TABS
200.0000 mg | ORAL_TABLET | Freq: Every day | ORAL | Status: DC
  Filled 2019-10-30: qty 1

## 2019-10-30 MED ORDER — FLUOXETINE HCL 10 MG PO CAPS: 10.0000 mg | ORAL_CAPSULE | Freq: Every day | ORAL | 0 refills | Status: DC

## 2019-10-30 MED ORDER — QUETIAPINE FUMARATE 200 MG PO TABS: 200.0000 mg | ORAL_TABLET | Freq: Every day | ORAL | 0 refills | Status: DC

## 2019-10-30 NOTE — Discharge Summary (Signed)
Physician Discharge Summary Note  Patient:  Mark Hammond is an 29 y.o., male MRN:  [id number] DOB:  08/03/1991 Patient phone:  [phone] (home)  Patient address:   [place] [address],  Total Time spent with patient: 15 minutes  Date of Admission:  10/28/2019 Date of Discharge: 10/30/19  Reason for Admission: drug overdose  Principal Problem: <principal problem not specified> Discharge Diagnoses: Active Problems:   Schizoaffective disorder, bipolar type (HCC)   Polysubstance (including opioids) dependence, daily use [facility])   Past Psychiatric History: History of prior psychiatric admissions, most recently here at [facility] in June 2020, at the time was admitted for depression, suicidal ideations, substance abuse, was discharged on Vraylar, Prozac, Trazodone. States he has been off his psychiatric medications for about two months. Denies history of suicidal attempts. Attributes depression at least in part to substance abuse .  Past Medical History:  Past Medical History:  Diagnosis Date  . Bipolar 1 disorder ([place])   . Dental abscess   . Depression   . Schizo affective schizophrenia ([place])    History reviewed. No pertinent surgical history. Family History:  Family History  Problem Relation Age of Onset  . Stroke Other    Family Psychiatric  History: Family history of depression. Social History:  Social History   Substance and Sexual Activity  Alcohol Use Yes   Comment: Drinks 3-4 times a week. Last drink: 2 days     Social History   Substance and Sexual Activity  Drug Use Not Currently   Comment: last used meth 1 week ago    Social History   Socioeconomic History  . Marital status: Single    Spouse name: Not on file  . Number of children: Not on file  . Years of education: Not on file  . Highest education level: Not on file  Occupational History  . Not on file  Tobacco Use  . Smoking status: Current Every Day Smoker    Packs/day: 1.00     Types: Cigarettes  . Smokeless tobacco: Never Used  Substance and Sexual Activity  . Alcohol use: Yes    Comment: Drinks 3-4 times a week. Last drink: 2 days  . Drug use: Not Currently    Comment: last used meth 1 week ago  . Sexual activity: Not on file  Other Topics Concern  . Not on file  Social History Narrative  . Not on file   Social Determinants of Health   Financial Resource Strain:   . Difficulty of Paying Living Expenses: Not on file  Food Insecurity:   . Worried About Charity fundraiser in the Last Year: Not on file  . Ran Out of Food in the Last Year: Not on file  Transportation Needs:   . Lack of Transportation (Medical): Not on file  . Lack of Transportation (Non-Medical): Not on file  Physical Activity:   . Days of Exercise per Week: Not on file  . Minutes of Exercise per Session: Not on file  Stress:   . Feeling of Stress : Not on file  Social Connections:   . Frequency of Communication with Friends and Family: Not on file  . Frequency of Social Gatherings with Friends and Family: Not on file  . Attends Religious Services: Not on file  . Active Member of Clubs or Organizations: Not on file  . Attends Archivist Meetings: Not on file  . Marital Status: Not on [facility]  Course:  From MD's admission SRA: 28, single, homeless, currently unemployed . Reports upcoming court date later this month. Patient had presented to ED on 1/21 reporting worsening substance abuse. At the time was medically cleared and discharged. Returned to ED via EMS after being found unresponsive by bystanders, with agonal breathing and pinpoint pupils, requiring Narcan. Patient states he has fragmented memories of event and states " last thing I remember was hitting the drug". He reported feeling suicidal recently, with thoughts of overdosing on drugs. He reports he has been feeling depressed over the last few weeks. Reports neuro-vegetative symptoms to include poor sleep,  poor appetite, low energy level. He also reports intermittent auditory hallucinations ( hears voice telling him things such as " do more drugs", " take a shower") , and fleeting visual hallucinations he describes as shadows . He reports history of substance use disorder. States that his substance of choice had been cocaine , but more recently has been using heroin, methamphetamineintermittently. He also reports he has been drinking regularly/daily , and that alcohol intake has tended to increase over recent weeks. Has been drinking up to 3-4 40 ounce beers per day. Last used 2 days ago. 1/21 BAL 126, UDS positive for cocaine. Denies medical illnesses, Allergic to Codeine , smokes 1/2 PPD . Denies history of WDL ( or other) seizures .   Mr. Mark Hammond was admitted after drug overdose as described above. He reported suicidal ideation on admission. UDS positive for cocaine and THC. Admission BAL 126. He remained on the [facility] unit for two days. CIWA protocol was started with Ativan PRN CIWA>10 for alcohol withdrawal. Clonidine COWS protocol was started for opioid withdrawal. Prozac and Seroquel were started. He participated in group therapy on the unit. He responded well to treatment with no adverse effects reported. He has shown improved mood, affect, sleep, and interaction. He is requesting discharge. He denies any SI/HI/AVH and contracts for safety. Denies withdrawal symptoms. He is discharging on the medications listed below. He declines referrals for follow-up. Patient is provided with prescriptions and medication samples upon discharge. He is discharging home with bus pass.   Physical Findings: AIMS:  , ,  ,  ,    CIWA:  CIWA-Ar Total: 1 COWS:  COWS Total Score: 1  Musculoskeletal: Strength & Muscle Tone: within normal limits Gait & Station: normal Patient leans: N/A  Psychiatric Specialty Exam: Physical Exam  Nursing note and vitals reviewed. Constitutional: He is oriented to person, place, and  time. He appears well-developed and well-nourished.  Cardiovascular: Normal rate.  Respiratory: Effort normal.  Neurological: He is alert and oriented to person, place, and time.    Review of Systems  Constitutional: Negative.   Respiratory: Negative for shortness of breath and stridor.   Gastrointestinal: Negative for nausea and vomiting.  Neurological: Negative for tremors and headaches.  Psychiatric/Behavioral: Negative for agitation, behavioral problems, dysphoric mood, hallucinations, self-injury, sleep disturbance and suicidal ideas. The patient is not nervous/anxious and is not hyperactive.     Blood pressure 116/78, pulse 61, temperature (!) 97.5 F (36.4 C), height 5\' 5"  (1.651 m), weight 60.1 kg, SpO2 100 %.Body mass index is 22.05 kg/m.  See MD's discharge SRA       Has this patient used any form of tobacco in the last 30 days? (Cigarettes, Smokeless Tobacco, Cigars, and/or Pipes)  No  Blood Alcohol level:  Lab Results  Component Value Date   ETH 126 (H) 10/24/2019   ETH 208 (H) 10/07/2019  Metabolic Disorder Labs:  Lab Results  Component Value Date   HGBA1C 5.1 10/29/2019   MPG 99.67 10/29/2019   MPG 99.67 03/07/2019   No results found for: PROLACTIN Lab Results  Component Value Date   CHOL 134 10/29/2019   TRIG 107 10/29/2019   HDL 60 10/29/2019   CHOLHDL 2.2 10/29/2019   VLDL 21 10/29/2019   LDLCALC 53 10/29/2019   LDLCALC 41 03/07/2019    See Psychiatric Specialty Exam and Suicide Risk Assessment completed by Attending Physician prior to discharge.  Discharge destination:  Home  Is patient on multiple antipsychotic therapies at discharge:  No   Has Patient had three or more failed trials of antipsychotic monotherapy by history:  No  Recommended Plan for Multiple Antipsychotic Therapies: NA  Discharge Instructions    Discharge instructions   Complete by: As directed    Patient is instructed to take all prescribed medications as  recommended. Report any side effects or adverse reactions to your outpatient psychiatrist. Patient is instructed to abstain from alcohol and illegal drugs while on prescription medications. In the event of worsening symptoms, patient is instructed to call the crisis hotline, 911, or go to the nearest emergency department for evaluation and treatment.     Allergies as of 10/30/2019      Reactions   Codeine Other (See Comments)   Patient states parents told him he was allergic at a young age.      Medication List    STOP taking these medications   traZODone 100 MG tablet Commonly known as: DESYREL     TAKE these medications     Indication  FLUoxetine 10 MG capsule Commonly known as: PROZAC Take 1 capsule (10 mg total) by mouth daily. Start taking on: October 31, 2019  Indication: Depression   multivitamin with minerals Tabs tablet Take 1 tablet by mouth daily. Start taking on: October 31, 2019  Indication: Supplementation   QUEtiapine 200 MG tablet Commonly known as: SEROQUEL Take 1 tablet (200 mg total) by mouth at bedtime.  Indication: Psychosis      Follow-up Information    PATIENT REFUSED/DECLINED REFERRALS Follow up.   Why: PATIENT REFUSED/DECLINED REFERRALS Contact information: PATIENT REFUSED/DECLINED REFERRALS          Follow-up recommendations: Activity as tolerated. Diet as recommended by primary care physician. Keep all scheduled follow-up appointments as recommended.   Comments:   Patient is instructed to take all prescribed medications as recommended. Report any side effects or adverse reactions to your outpatient psychiatrist. Patient is instructed to abstain from alcohol and illegal drugs while on prescription medications. In the event of worsening symptoms, patient is instructed to call the crisis hotline, 911, or go to the nearest emergency department for evaluation and treatment.  Signed: Aldean Baker, NP 10/30/2019, 9:53 AM

## 2019-10-30 NOTE — Plan of Care (Signed)
Discharge note  Patient verbalizes readiness for discharge. Follow up plan explained, AVS, Transition record and SRA given. Prescriptions and teaching provided. Belongings returned and signed for. Suicide safety plan completed and signed. Patient verbalizes understanding. Patient denies SI/HI and assures this Clinical research associate they will seek assistance should that change. Patient discharged to lobby where pt was instructed on where to get on city bus. Bus passes provided. Problem: Education: Goal: Ability to state activities that reduce stress will improve Outcome: Adequate for Discharge   Problem: Coping: Goal: Ability to identify and develop effective coping behavior will improve Outcome: Adequate for Discharge   Problem: Self-Concept: Goal: Ability to identify factors that promote anxiety will improve Outcome: Adequate for Discharge   Problem: Education: Goal: Knowledge of the prescribed therapeutic regimen will improve Outcome: Adequate for Discharge   Problem: Coping: Goal: Coping ability will improve Outcome: Adequate for Discharge Goal: Will verbalize feelings Outcome: Adequate for Discharge   Problem: Health Behavior/Discharge Planning: Goal: Ability to make decisions will improve Outcome: Adequate for Discharge Goal: Compliance with therapeutic regimen will improve Outcome: Adequate for Discharge   Problem: Role Relationship: Goal: Will demonstrate positive changes in social behaviors and relationships Outcome: Adequate for Discharge   Problem: Safety: Goal: Ability to disclose and discuss suicidal ideas will improve Outcome: Adequate for Discharge   Problem: Education: Goal: Knowledge of [facility] Education information/materials will improve Outcome: Adequate for Discharge Goal: Emotional status will improve Outcome: Adequate for Discharge Goal: Mental status will improve Outcome: Adequate for Discharge Goal: Verbalization of understanding the information  provided will improve Outcome: Adequate for Discharge   Problem: Activity: Goal: Interest or engagement in activities will improve Outcome: Adequate for Discharge Goal: Sleeping patterns will improve Outcome: Adequate for Discharge   Problem: Coping: Goal: Ability to verbalize frustrations and anger appropriately will improve Outcome: Adequate for Discharge Goal: Ability to demonstrate self-control will improve Outcome: Adequate for Discharge   Problem: Health Behavior/Discharge Planning: Goal: Identification of resources available to assist in meeting health care needs will improve Outcome: Adequate for Discharge Goal: Compliance with treatment plan for underlying cause of condition will improve Outcome: Adequate for Discharge   Problem: Physical Regulation: Goal: Ability to maintain clinical measurements within normal limits will improve Outcome: Adequate for Discharge   Problem: Safety: Goal: Periods of time without injury will increase Outcome: Adequate for Discharge   Problem: Education: Goal: Ability to make informed decisions regarding treatment will improve Outcome: Adequate for Discharge   Problem: Health Behavior/Discharge Planning: Goal: Identification of resources available to assist in meeting health care needs will improve Outcome: Adequate for Discharge   Problem: Self-Concept: Goal: Ability to disclose and discuss suicidal ideas will improve Outcome: Adequate for Discharge Goal: Will verbalize positive feelings about self Outcome: Adequate for Discharge

## 2019-10-30 NOTE — Progress Notes (Signed)
  [facility] Adult Case Management Discharge Plan :  Will you be returning to the same living situation after discharge:  Yes,  patient reports he is returning home with his girlfriend At discharge, do you have transportation home?: Yes,  patient requested bus passes Do you have the ability to pay for your medications: No.  Release of information consent forms completed and in the chart;  Patient's signature needed at discharge.  Patient to Follow up at: Follow-up Information    PATIENT REFUSED/DECLINED REFERRALS Follow up.   Why: PATIENT REFUSED/DECLINED REFERRALS Contact information: PATIENT REFUSED/DECLINED REFERRALS          Next level of care provider has access to [facility] Link:yes  Safety Planning and Suicide Prevention discussed: Yes,  with the patient     Has patient been referred to the Quitline?: Patient refused referral  Patient has been referred for addiction treatment: Pt. refused referral  [name], LCSWA 10/30/2019, 9:35 AM

## 2019-10-30 NOTE — [facility] Suicide Risk Assessment (Signed)
[facility] Discharge Suicide Risk Assessment   Principal Problem: <principal problem not specified> Discharge Diagnoses: Active Problems:   Schizoaffective disorder, bipolar type (HCC)   Polysubstance (including opioids) dependence, daily use (HCC)   Total Time spent with patient: 15 minutes  Musculoskeletal: Strength & Muscle Tone: within normal limits Gait & Station: normal Patient leans: N/A  Psychiatric Specialty Exam: Review of Systems  All other systems reviewed and are negative.   Blood pressure 116/78, pulse 61, temperature (!) 97.5 F (36.4 C), height 5\' 5"  (1.651 m), weight 60.1 kg, SpO2 100 %.Body mass index is 22.05 kg/m.  General Appearance: Casual  Eye Contact::  Fair  Speech:  Normal Rate409  Volume:  Normal  Mood:  Euthymic  Affect:  Congruent  Thought Process:  Coherent and Descriptions of Associations: Intact  Orientation:  Full (Time, Place, and Person)  Thought Content:  Logical  Suicidal Thoughts:  No  Homicidal Thoughts:  No  Memory:  Immediate;   Fair Recent;   Fair Remote;   Fair  Judgement:  Intact  Insight:  Fair  Psychomotor Activity:  Normal  Concentration:  Fair  Recall:  [phone] of Knowledge:Good  Language: Good  Akathisia:  Negative  Handed:  Right  AIMS (if indicated):     Assets:  Desire for Improvement Resilience  Sleep:  Number of Hours: 6.75  Cognition: WNL  ADL's:  Intact   Mental Status Per Nursing Assessment::   On Admission:  Self-harm thoughts  Demographic Factors:  Male, Caucasian, Low socioeconomic status and Unemployed  Loss Factors: NA  Historical Factors: Impulsivity  Risk Reduction Factors:   NA  Continued Clinical Symptoms:  Bipolar Disorder:   Mixed State Alcohol/Substance Abuse/Dependencies  Cognitive Features That Contribute To Risk:  None    Suicide Risk:  Minimal: No identifiable suicidal ideation.  Patients presenting with no risk factors but with morbid ruminations; may be classified as  minimal risk based on the severity of the depressive symptoms    Plan Of Care/Follow-up recommendations:  Activity:  ad lib  [phone], MD 10/30/2019, 9:02 AM

## 2019-11-07 ENCOUNTER — Other Ambulatory Visit: Payer: Self-pay

## 2019-11-07 ENCOUNTER — Emergency Department (HOSPITAL_COMMUNITY)
Admission: EM | Admit: 2019-11-07 | Discharge: 2019-11-07 | Disposition: A | Discharge status: Home / Self Care | Payer: Self-pay | Attending: Emergency Medicine | Admitting: Emergency Medicine

## 2019-11-07 ENCOUNTER — Encounter (HOSPITAL_COMMUNITY): Payer: Self-pay | Admitting: Emergency Medicine

## 2019-11-07 ENCOUNTER — Emergency Department (HOSPITAL_COMMUNITY): Payer: Self-pay

## 2019-11-07 DIAGNOSIS — F319 Bipolar disorder, unspecified: Secondary | ICD-10-CM | POA: Insufficient documentation

## 2019-11-07 DIAGNOSIS — Z79899 Other long term (current) drug therapy: Secondary | ICD-10-CM | POA: Insufficient documentation

## 2019-11-07 DIAGNOSIS — F259 Schizoaffective disorder, unspecified: Secondary | ICD-10-CM | POA: Insufficient documentation

## 2019-11-07 DIAGNOSIS — F17210 Nicotine dependence, cigarettes, uncomplicated: Secondary | ICD-10-CM | POA: Insufficient documentation

## 2019-11-07 DIAGNOSIS — F1910 Other psychoactive substance abuse, uncomplicated: Secondary | ICD-10-CM | POA: Insufficient documentation

## 2019-11-07 DIAGNOSIS — R079 Chest pain, unspecified: Secondary | ICD-10-CM | POA: Insufficient documentation

## 2019-11-07 LAB — CBC
HCT: 46.9 % (ref 39.0–52.0)
Hemoglobin: 16.5 g/dL (ref 13.0–17.0)
MCH: 32.6 pg (ref 26.0–34.0)
MCHC: 35.2 g/dL (ref 30.0–36.0)
MCV: 92.7 fL (ref 80.0–100.0)
Platelets: 277 10*3/uL (ref 150–400)
RBC: 5.06 MIL/uL (ref 4.22–5.81)
RDW: 13 % (ref 11.5–15.5)
WBC: 10.3 10*3/uL (ref 4.0–10.5)
nRBC: 0 % (ref 0.0–0.2)

## 2019-11-07 LAB — RAPID URINE DRUG SCREEN, HOSP PERFORMED
Amphetamines: POSITIVE — AB
Barbiturates: NOT DETECTED
Benzodiazepines: NOT DETECTED
Cocaine: POSITIVE — AB
Opiates: NOT DETECTED
Tetrahydrocannabinol: POSITIVE — AB

## 2019-11-07 LAB — TROPONIN I (HIGH SENSITIVITY)
Troponin I (High Sensitivity): <2 ng/L (ref <18)
Troponin I (High Sensitivity): <2 ng/L (ref <18)

## 2019-11-07 LAB — BASIC METABOLIC PANEL
Anion gap: 13 (ref 5–15)
BUN: 6 mg/dL (ref 6–20)
CO2: 24 mmol/L (ref 22–32)
Calcium: 8.6 mg/dL — ABNORMAL LOW (ref 8.9–10.3)
Chloride: 99 mmol/L (ref 98–111)
Creatinine, Ser: 0.81 mg/dL (ref 0.61–1.24)
GFR calc Af Amer: >60 mL/min (ref >60)
GFR calc non Af Amer: >60 mL/min (ref >60)
Glucose, Bld: 95 mg/dL (ref 70–99)
Potassium: 3.6 mmol/L (ref 3.5–5.1)
Sodium: 136 mmol/L (ref 135–145)

## 2019-11-07 LAB — ETHANOL: Alcohol, Ethyl (B): <10 mg/dL (ref <10)

## 2019-11-07 LAB — D-DIMER, QUANTITATIVE: D-Dimer, Quant: 0.56 ug/mL-FEU — ABNORMAL HIGH (ref 0.00–0.50)

## 2019-11-07 MED ORDER — IOHEXOL 350 MG/ML SOLN
100.0000 mL | Freq: Once | INTRAVENOUS | Status: AC | PRN
  Administered 2019-11-07: 54 mL via INTRAVENOUS

## 2019-11-07 MED ORDER — SODIUM CHLORIDE (PF) 0.9 % IJ SOLN
INTRAMUSCULAR | Status: AC
  Filled 2019-11-07: qty 50

## 2019-11-07 NOTE — ED Notes (Signed)
This RN went to take VS on patient but could not find patient in room. All of patient's belongings including cell phone and clothes were missing from room. Security notified that patient may have left and has IV intact. MD aware.

## 2019-11-07 NOTE — Discharge Instructions (Signed)
For your behavioral health needs you are advised to follow up with [facility].  New patients are seen at their walk-in clinic.  Walk-in hours are Monday - Friday from 8:30 am - 12:00 pm, and from 1:00 pm - 2:30 pm.  Walk-in patients are seen on a first come, first served basis, so try to arrive as early as possible for the best chance of being seen the same day:       [facility]      [address]      [place], [place] [address]      [phone] 

## 2019-11-07 NOTE — ED Notes (Signed)
Patient transported to CT 

## 2019-11-07 NOTE — Patient Outreach (Signed)
[facility] tried meeting with the patient in order to provide substance use recovery support and provide information for substance use recovery resources, but [facility] was unable to locate the patient with the patient not being in his room. CPSS left information for substance use recovery resources at beside including a residential/outpatient substance use treatment center list, online/in-person NA meeting list, [facility] vacancy list/flier detailing [facility], and [facility]. [facility] also provided written instructions on how to follow up with [facility] if needed for further help with getting connected to substance use recovery resources. 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 UDS was positive for cocaine, amphetamines, and THC. EtOH test was negative. Per TTS assessment on 11/07/19, patient denied active THC and cocaine use even though the patient test positive on his UDS for cocaine and THC.

## 2019-11-07 NOTE — BH Assessment (Signed)
Assessment Note  Mark Hammond is an 29 y.o. male. Patient with history of Bipolar I Disorder, Depression, Schizoaffective/Schizophrenia presented to [facility] with complaints of chest pain, substance use, HI, and hallucinations. Counselor attempted to assess him but he refused stating, "I'm not answering any [place] questions". Patient grabbed cell phone and proceeded to play on the phone ignoring this counselor. Several additional attempts were made to assess patient but he continued to remain on his phone ignoring the counselor. ED staff was called and asked to remove cell phone in hopes that patient would participate in the assessment. Cell phone was never removed. However, counselor attempted for a second time and he agreed to participate in the assessment. Patient denied SI. Denied history of SI. However, last assessment from January indicated 1 attempt-overdose. Counselor asked patient about this attempt and he then admitted that he tried to hurt himself. He refused to provide what triggered the previous attempt. Notes indicated "conflict with girlfriend. He denies self mutilating behaviors. He denies current HI. However, admits to making homicidal statements upon arrival. States, "I only said it because people were getting on my nerves". He refused to identify who the person is or was that was getting on his nerves stating, "That doesn't matter". He refused having a plan to harm the persons getting on his nerves earlier. He reported having hallucinations upon arrival but denies current symptoms. States that he was hallucinating due to no sleep in 72 hours. He does not recall the details of his hallucinations stating, "I was out of it". Patient admits to methamphetamine use; last use was 10/31/2019. He was positive for THC and cocaine but denies use. He was unable to explain how these drugs may have entered his system. He denies experiencing insomnia in the past and does not know what may have triggered his insomnia.  Appetite is good. He denies having an outpatient provider. He does report multiple admissions to [facility] for psychotic symptoms, SI, and substance use. He has never participated in a substance abuse specific program. No support system reported. Lives with a friend and states he is able to return back to that residence.   Diagnosis: Bipolar I Disorder, Depression, Schizoaffective/Schizophrenia  Past Medical History:  Past Medical History:  Diagnosis Date  . Bipolar 1 disorder ([place])   . Dental abscess   . Depression   . Schizo affective schizophrenia ([place])     History reviewed. No pertinent surgical history.  Family History:  Family History  Problem Relation Age of Onset  . Stroke Other     Social History:  reports that he has been smoking cigarettes. He has been smoking about 1.00 pack per day. He has never used smokeless tobacco. He reports current alcohol use. He reports previous drug use.  Additional Social History:  Alcohol / Drug Use Pain Medications: SEE MAR Prescriptions: SEE MAR Over the Counter: SEE MAR History of alcohol / drug use?: Yes Longest period of sobriety (when/how long): 2 years Withdrawal Symptoms: Irritability Substance #1 Name of Substance 1: Methamphetamine 1 - Age of First Use: 16 1 - Amount (size/oz): 25 grams 1 - Frequency: daily 1 - Duration: on-going 1 - Last Use / Amount: 10/31/2019 Substance #2 Name of Substance 2: Positive for Cocaine 2 - Age of First Use: denies use 2 - Amount (size/oz): denies use 2 - Frequency: denies use 2 - Duration: denies use 2 - Last Use / Amount: denies use Substance #3 Name of Substance 3: positive for THC 3 - Age of  First Use: denies use 3 - Amount (size/oz): denies use 3 - Frequency: denies use 3 - Duration: denies use 3 - Last Use / Amount: denies use  CIWA: CIWA-Ar BP: (!) 120/93 Pulse Rate: (!) 102 COWS:    Allergies:  Allergies  Allergen Reactions  . Codeine Other (See Comments)    Patient states  parents told him he was allergic at a young age.    Home Medications: (Not in a hospital admission)   OB/GYN Status:  No LMP for male patient.  General Assessment Data Assessment unable to be completed: Yes Location of Assessment: [facility] ED TTS Assessment: In system Is this a Tele or Face-to-Face Assessment?: Tele Assessment Is this an Initial Assessment or a Re-assessment for this encounter?: Initial Assessment Patient Accompanied by:: (brought to [facility] by paramedics (called by patient)) Language Other than English: No(English) Living Arrangements: Other (Comment)("living with someone") What gender do you identify as?: Male Marital status: Single Maiden name: (n/a) Pregnancy Status: No Living Arrangements: Other (Comment)(lives with a friend) Can pt return to current living arrangement?: Yes Is patient capable of signing voluntary admission?: Yes Referral Source: Self/Family/Friend Insurance type: (Self pay )     Crisis Care Plan Living Arrangements: Other (Comment)(lives with a friend) Name of Psychiatrist: (no psychiatrist ) Name of Therapist: (no therapist )  Education Status Is patient currently in school?: No(highest level of education is 10th grade) Is the patient employed, unemployed or receiving disability?: Unemployed  Risk to self with the past 6 months Suicidal Ideation: No Has patient been a risk to self within the past 6 months prior to admission? : No Suicidal Intent: No Has patient had any suicidal intent within the past 6 months prior to admission? : No Is patient at risk for suicide?: No Suicidal Plan?: No Has patient had any suicidal plan within the past 6 months prior to admission? : No Specify Current Suicidal Plan: (patient denies ) Access to Means: No Specify Access to Suicidal Means: (patient denies ) What has been your use of drugs/alcohol within the last 12 months?: (methaphetamine ) Previous Attempts/Gestures: Yes How many times?: (1 prior  attempt-overdose) Other Self Harm Risks: (patient denies ) Triggers for Past Attempts: Other (Comment)(hallucinations noted; patient denies this ) Intentional Self Injurious Behavior: None Comment - Self Injurious Behavior: (patient denies ) Family Suicide History: No Recent stressful life event(s): Other (Comment)("Everybody is stressing me out") Persecutory voices/beliefs?: No Depression: No(denies ) Depression Symptoms: (denies symptoms ) Substance abuse history and/or treatment for substance abuse?: Yes("I've only been to [facility]") Suicide prevention information given to non-admitted patients: Not applicable  Risk to Others within the past 6 months Homicidal Ideation: Yes-Currently Present("People kept on testing me") Does patient have any lifetime risk of violence toward others beyond the six months prior to admission? : No Thoughts of Harm to Others: No-Not Currently Present/Within Last 6 Months Comment - Thoughts of Harm to Others: ("When I came in I was upset with someone else") Current Homicidal Intent: No-Not Currently/Within Last 6 Months Current Homicidal Plan: No Access to Homicidal Means: No Identified Victim: ("Someone", "The person is not relevant") History of harm to others?: No Assessment of Violence: None Noted Violent Behavior Description: (currently cooperative but aggitated ) Does patient have access to weapons?: No Criminal Charges Pending?: Yes Describe Pending Criminal Charges: ("I'm not sure"; Prior notes indicate treaspassing charges) Does patient have a court date: Yes Court Date: (11/01/2019) Is patient on probation?: No  Psychosis Hallucinations: Auditory(patient denies stating that  he was experiencing AVH's) Delusions: None noted  Mental Status Report Appearance/Hygiene: Unremarkable Eye Contact: Poor Motor Activity: Restlessness Speech: Logical/coherent Level of Consciousness: Alert Mood: Anxious Affect: Anxious, Appropriate to  circumstance, Sad, Sullen Anxiety Level: None Thought Processes: Coherent, Relevant Judgement: Unimpaired Orientation: Person, Place, Time, Situation, Appropriate for developmental age Obsessive Compulsive Thoughts/Behaviors: None  Cognitive Functioning Concentration: Normal Memory: Recent Intact, Remote Intact Is patient IDD: No Insight: Fair Impulse Control: Fair Appetite: Poor("I'm starving") Have you had any weight changes? : No Change Sleep: No Change Total Hours of Sleep: (patient reports 9 hours of sleep per night) Vegetative Symptoms: None  ADLScreening [facility] Assessment Services) Patient's cognitive ability adequate to safely complete daily activities?: Yes Patient able to express need for assistance with ADLs?: No Independently performs ADLs?: No  Prior Inpatient Therapy Prior Inpatient Therapy: Yes Prior Therapy Dates: 03/2019, multiple admits Prior Therapy Facilty/Provider(s): [facility] and other facilities Reason for Treatment: schizophrenia and SA  Prior Outpatient Therapy Prior Outpatient Therapy: No Does patient have an ACCT team?: No Does patient have Intensive In-House Services?  : No Does patient have [facility] services? : No Does patient have P4CC services?: No  ADL Screening (condition at time of admission) Patient's cognitive ability adequate to safely complete daily activities?: Yes Is the patient deaf or have difficulty hearing?: No Does the patient have difficulty seeing, even when wearing glasses/contacts?: No Does the patient have difficulty concentrating, remembering, or making decisions?: No Patient able to express need for assistance with ADLs?: No Does the patient have difficulty dressing or bathing?: No Independently performs ADLs?: No Does the patient have difficulty walking or climbing stairs?: No Weakness of Legs: None Weakness of Arms/Hands: None     Therapy Consults (therapy consults require a physician order) PT Evaluation Needed:  No OT Evalulation Needed: No SLP Evaluation Needed: No       Advance Directives (For Healthcare) Does Patient Have a Medical Advance Directive?: No          Disposition: Discussed clinicals with Berneice Heinrich, NP and patient is psych cleared and ok to discharge as he denies SI, HI, and AVH's. Inetta Fermo recommends that patient follow up with peer support prior to discharge to discuss substance use referrals/treatment options. Counselor attempted to notify patient's nurse but able to reach her. Notified EDP Coy Saunas) and discussed details or patient's discharge.  Disposition Initial Assessment Completed for this Encounter: Yes  On Site Evaluation by:   Reviewed with Physician:    Melynda Ripple 11/07/2019 11:01 AM

## 2019-11-07 NOTE — ED Triage Notes (Signed)
Pt arriving via EMS for chest pain and shortness of breath. Pt states "I smoked crack and meth and I think there's too much of it crystallized in my lungs." Pt not presenting with any signs of respiratory distress at this time.
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
## 2019-11-07 NOTE — BH Assessment (Signed)
[facility] Assessment Progress Note  Per Berneice Heinrich, FNP, this pt does not require psychiatric hospitalization at this time.  Pt is to be discharged from [facility] with recommendation to follow up with [facility] [place].  This has been included in pt's discharge instructions.  Pt would also benefit from seeing Peer Support Specialists, and a peer support consult has been ordered for pt.  Pt's nurse has been notified.  Doylene Canning, MA Triage Specialist [phone]

## 2019-11-10 ENCOUNTER — Other Ambulatory Visit: Payer: Self-pay

## 2019-11-10 ENCOUNTER — Emergency Department (HOSPITAL_COMMUNITY)
Admission: EM | Admit: 2019-11-10 | Discharge: 2019-11-11 | Disposition: A | Discharge status: Home / Self Care | Payer: Self-pay | Attending: Emergency Medicine | Admitting: Emergency Medicine

## 2019-11-10 ENCOUNTER — Encounter (HOSPITAL_COMMUNITY): Payer: Self-pay

## 2019-11-10 DIAGNOSIS — R440 Auditory hallucinations: Secondary | ICD-10-CM | POA: Insufficient documentation

## 2019-11-10 DIAGNOSIS — F250 Schizoaffective disorder, bipolar type: Secondary | ICD-10-CM | POA: Diagnosis present

## 2019-11-10 DIAGNOSIS — F1120 Opioid dependence, uncomplicated: Secondary | ICD-10-CM | POA: Diagnosis present

## 2019-11-10 DIAGNOSIS — R451 Restlessness and agitation: Secondary | ICD-10-CM | POA: Insufficient documentation

## 2019-11-10 DIAGNOSIS — F1994 Other psychoactive substance use, unspecified with psychoactive substance-induced mood disorder: Secondary | ICD-10-CM | POA: Diagnosis present

## 2019-11-10 DIAGNOSIS — F1020 Alcohol dependence, uncomplicated: Secondary | ICD-10-CM | POA: Diagnosis present

## 2019-11-10 DIAGNOSIS — F1920 Other psychoactive substance dependence, uncomplicated: Secondary | ICD-10-CM | POA: Diagnosis present

## 2019-11-10 DIAGNOSIS — Z79899 Other long term (current) drug therapy: Secondary | ICD-10-CM | POA: Insufficient documentation

## 2019-11-10 DIAGNOSIS — F15921 Other stimulant use, unspecified with intoxication delirium: Secondary | ICD-10-CM | POA: Insufficient documentation

## 2019-11-10 DIAGNOSIS — F17210 Nicotine dependence, cigarettes, uncomplicated: Secondary | ICD-10-CM | POA: Insufficient documentation

## 2019-11-10 DIAGNOSIS — Z20822 Contact with and (suspected) exposure to covid-19: Secondary | ICD-10-CM | POA: Insufficient documentation

## 2019-11-10 LAB — COMPREHENSIVE METABOLIC PANEL
ALT: 40 U/L (ref 0–44)
ALT: 30 U/L (ref 0–44)
AST: 60 U/L — ABNORMAL HIGH (ref 15–41)
AST: 47 U/L — ABNORMAL HIGH (ref 15–41)
Albumin: 4.4 g/dL (ref 3.5–5.0)
Albumin: 3 g/dL — ABNORMAL LOW (ref 3.5–5.0)
Alkaline Phosphatase: 113 U/L (ref 38–126)
Alkaline Phosphatase: 77 U/L (ref 38–126)
Anion gap: 19 — ABNORMAL HIGH (ref 5–15)
Anion gap: 12 (ref 5–15)
BUN: 13 mg/dL (ref 6–20)
BUN: 8 mg/dL (ref 6–20)
CO2: 20 mmol/L — ABNORMAL LOW (ref 22–32)
CO2: 21 mmol/L — ABNORMAL LOW (ref 22–32)
Calcium: 9 mg/dL (ref 8.9–10.3)
Calcium: 7.6 mg/dL — ABNORMAL LOW (ref 8.9–10.3)
Chloride: 98 mmol/L (ref 98–111)
Chloride: 104 mmol/L (ref 98–111)
Creatinine, Ser: 0.91 mg/dL (ref 0.61–1.24)
Creatinine, Ser: 0.69 mg/dL (ref 0.61–1.24)
GFR calc Af Amer: >60 mL/min (ref >60)
GFR calc Af Amer: >60 mL/min (ref >60)
GFR calc non Af Amer: >60 mL/min (ref >60)
GFR calc non Af Amer: >60 mL/min (ref >60)
Glucose, Bld: 117 mg/dL — ABNORMAL HIGH (ref 70–99)
Glucose, Bld: 68 mg/dL — ABNORMAL LOW (ref 70–99)
Potassium: 3 mmol/L — ABNORMAL LOW (ref 3.5–5.1)
Potassium: 3.4 mmol/L — ABNORMAL LOW (ref 3.5–5.1)
Sodium: 137 mmol/L (ref 135–145)
Sodium: 137 mmol/L (ref 135–145)
Total Bilirubin: 0.7 mg/dL (ref 0.3–1.2)
Total Bilirubin: 1.6 mg/dL — ABNORMAL HIGH (ref 0.3–1.2)
Total Protein: 8.2 g/dL — ABNORMAL HIGH (ref 6.5–8.1)
Total Protein: 6 g/dL — ABNORMAL LOW (ref 6.5–8.1)

## 2019-11-10 LAB — CBC WITH DIFFERENTIAL/PLATELET
Abs Immature Granulocytes: 0.03 10*3/uL (ref 0.00–0.07)
Abs Immature Granulocytes: 0.02 10*3/uL (ref 0.00–0.07)
Basophils Absolute: 0.1 10*3/uL (ref 0.0–0.1)
Basophils Absolute: 0.1 10*3/uL (ref 0.0–0.1)
Basophils Relative: 2 %
Basophils Relative: 1 %
Eosinophils Absolute: 0 10*3/uL (ref 0.0–0.5)
Eosinophils Absolute: 0.1 10*3/uL (ref 0.0–0.5)
Eosinophils Relative: 0 %
Eosinophils Relative: 1 %
HCT: 48.8 % (ref 39.0–52.0)
HCT: 41.6 % (ref 39.0–52.0)
Hemoglobin: 17.1 g/dL — ABNORMAL HIGH (ref 13.0–17.0)
Hemoglobin: 14.2 g/dL (ref 13.0–17.0)
Immature Granulocytes: 0 %
Immature Granulocytes: 0 %
Lymphocytes Relative: 13 %
Lymphocytes Relative: 30 %
Lymphs Abs: 1.1 10*3/uL (ref 0.7–4.0)
Lymphs Abs: 1.6 10*3/uL (ref 0.7–4.0)
MCH: 32.1 pg (ref 26.0–34.0)
MCH: 32.6 pg (ref 26.0–34.0)
MCHC: 35 g/dL (ref 30.0–36.0)
MCHC: 34.1 g/dL (ref 30.0–36.0)
MCV: 91.6 fL (ref 80.0–100.0)
MCV: 95.4 fL (ref 80.0–100.0)
Monocytes Absolute: 0.6 10*3/uL (ref 0.1–1.0)
Monocytes Absolute: 0.4 10*3/uL (ref 0.1–1.0)
Monocytes Relative: 7 %
Monocytes Relative: 7 %
Neutro Abs: 6.2 10*3/uL (ref 1.7–7.7)
Neutro Abs: 3.1 10*3/uL (ref 1.7–7.7)
Neutrophils Relative %: 78 %
Neutrophils Relative %: 61 %
Platelets: 308 10*3/uL (ref 150–400)
Platelets: 190 10*3/uL (ref 150–400)
RBC: 5.33 MIL/uL (ref 4.22–5.81)
RBC: 4.36 MIL/uL (ref 4.22–5.81)
RDW: 12.4 % (ref 11.5–15.5)
RDW: 12.9 % (ref 11.5–15.5)
WBC: 7.9 10*3/uL (ref 4.0–10.5)
WBC: 5.1 10*3/uL (ref 4.0–10.5)
nRBC: 0 % (ref 0.0–0.2)
nRBC: 0 % (ref 0.0–0.2)

## 2019-11-10 LAB — RAPID URINE DRUG SCREEN, HOSP PERFORMED
Amphetamines: POSITIVE — AB
Barbiturates: NOT DETECTED
Benzodiazepines: NOT DETECTED
Cocaine: POSITIVE — AB
Opiates: NOT DETECTED
Tetrahydrocannabinol: POSITIVE — AB

## 2019-11-10 LAB — CK: Total CK: 412 U/L — ABNORMAL HIGH (ref 49–397)

## 2019-11-10 LAB — ETHANOL: Alcohol, Ethyl (B): 14 mg/dL — ABNORMAL HIGH (ref <10)

## 2019-11-10 LAB — RESPIRATORY PANEL BY RT PCR (FLU A&B, COVID)
Influenza A by PCR: NEGATIVE
Influenza B by PCR: NEGATIVE
SARS Coronavirus 2 by RT PCR: NEGATIVE

## 2019-11-10 LAB — ACETAMINOPHEN LEVEL: Acetaminophen (Tylenol), Serum: <10 ug/mL — ABNORMAL LOW (ref 10–30)

## 2019-11-10 LAB — SALICYLATE LEVEL: Salicylate Lvl: <7 mg/dL — ABNORMAL LOW (ref 7.0–30.0)

## 2019-11-10 MED ORDER — LORAZEPAM 1 MG PO TABS: 0.0000 mg | ORAL_TABLET | Freq: Four times a day (QID) | ORAL | Status: DC

## 2019-11-10 MED ORDER — NICOTINE 21 MG/24HR TD PT24
21.0000 mg | MEDICATED_PATCH | Freq: Every day | TRANSDERMAL | Status: DC
  Administered 2019-11-10: 21 mg via TRANSDERMAL
  Filled 2019-11-10: qty 1

## 2019-11-10 MED ORDER — DIPHENHYDRAMINE HCL 50 MG/ML IJ SOLN
50.0000 mg | Freq: Once | INTRAMUSCULAR | Status: AC
  Administered 2019-11-10: 50 mg via INTRAVENOUS
  Filled 2019-11-10: qty 1

## 2019-11-10 MED ORDER — LORAZEPAM 2 MG/ML IJ SOLN
2.0000 mg | Freq: Once | INTRAMUSCULAR | Status: AC
  Administered 2019-11-10: 2 mg via INTRAVENOUS

## 2019-11-10 MED ORDER — LORAZEPAM 2 MG/ML IJ SOLN: 0.0000 mg | Freq: Two times a day (BID) | INTRAMUSCULAR | Status: DC

## 2019-11-10 MED ORDER — OLANZAPINE 10 MG PO TBDP
10.0000 mg | ORAL_TABLET | Freq: Once | ORAL | Status: AC
  Administered 2019-11-10: 10 mg via ORAL
  Filled 2019-11-10: qty 1

## 2019-11-10 MED ORDER — LORAZEPAM 2 MG/ML IJ SOLN
0.0000 mg | Freq: Four times a day (QID) | INTRAMUSCULAR | Status: DC
  Administered 2019-11-10: 2 mg via INTRAVENOUS
  Administered 2019-11-10: 4 mg via INTRAVENOUS
  Filled 2019-11-10 (×2): qty 1

## 2019-11-10 MED ORDER — POTASSIUM CHLORIDE CRYS ER 20 MEQ PO TBCR
40.0000 meq | EXTENDED_RELEASE_TABLET | Freq: Once | ORAL | Status: AC
  Administered 2019-11-10: 40 meq via ORAL
  Filled 2019-11-10: qty 2

## 2019-11-10 MED ORDER — SODIUM CHLORIDE 0.9 % IV BOLUS (SEPSIS)
1000.0000 mL | Freq: Once | INTRAVENOUS | Status: AC
  Administered 2019-11-10: 1000 mL via INTRAVENOUS

## 2019-11-10 MED ORDER — SODIUM CHLORIDE 0.9 % IV BOLUS
1000.0000 mL | Freq: Once | INTRAVENOUS | Status: AC
  Administered 2019-11-10: 1000 mL via INTRAVENOUS

## 2019-11-10 MED ORDER — THIAMINE HCL 100 MG PO TABS: 100.0000 mg | ORAL_TABLET | Freq: Every day | ORAL | Status: DC

## 2019-11-10 MED ORDER — LORAZEPAM 1 MG PO TABS: 0.0000 mg | ORAL_TABLET | Freq: Two times a day (BID) | ORAL | Status: DC

## 2019-11-10 MED ORDER — LORAZEPAM 2 MG/ML IJ SOLN
INTRAMUSCULAR | Status: AC
  Filled 2019-11-10: qty 2

## 2019-11-10 MED ORDER — THIAMINE HCL 100 MG/ML IJ SOLN
100.0000 mg | Freq: Every day | INTRAMUSCULAR | Status: DC
  Administered 2019-11-10: 100 mg via INTRAVENOUS
  Filled 2019-11-10: qty 2

## 2019-11-10 NOTE — ED Notes (Signed)
He remains very agitated and drowsy. His scratching behaviors have ceased, however, after the Benadryl. I am utterly unable to keep monitor leads on him. His IV remains in; and I also cannot consistently maintain Pulse ox. Positioning d/t his restlessness and constant machinations.

## 2019-11-10 NOTE — ED Provider Notes (Cosign Needed)
[facility] DEPT Provider Note   CSN: [id number] Arrival date & time: 11/10/19  0815     History Chief Complaint  Patient presents with  . Hallucinations    Mark Hammond is a 29 y.o. male.  HPI      Mark Hammond is a 29 y.o. male, with a history of bipolar and schizoaffective schizophrenia, presenting to the ED with concern for psychiatric abnormality. PD at the bedside states EMS was called for the patient [place] and seemingly talking to himself.  When the patient was brought into the back of the ambulance, he became agitated, EMS called for PD emergently, patient ran out of the back of ambulance while the ambulance was still stationary.  He was picked up a short time later by PD.  Patient at times will admit to auditory hallucinations.  He states he is hearing voices telling him to "get high and use drugs."  Other times, he denies auditory hallucinations.  He is able to recount the event in the back of the ambulance.  He states, "I just started freaking out from all these drugs and got out of there as fast as I could."   Plan: Patient endorses occasional suicidal ideations.  He thinks about overdosing on drugs.  Support system: Denies reliable support system.  Previous Attempts: Endorses previous attempts with overdose.  Drug/alcohol use:  He has been using methamphetamine and crack cocaine for several days.  He states he drinks heavily, but will not quantify.  "I drink a lot and I drink every day.  I drink what ever I can get my hands on."  Medication compliance: He has not taken his medications for the past week and a half at least stating when he uses drugs he does not take his medications.  Medication changes: N/A  Physical complaints: Denies any physical complaints.      Past Medical History:  Diagnosis Date  . Bipolar 1 disorder ([place])   . Dental abscess   . Depression   . Schizo affective  schizophrenia [facility])     Patient Active Problem List   Diagnosis Date Noted  . Schizoaffective disorder, bipolar type ([place]) 10/28/2019  . Polysubstance (including opioids) dependence, daily use ([place])   . Substance induced mood disorder ([place]) 10/24/2019  . Methamphetamine use disorder, severe ([place]) 08/24/2019  . Alcohol use disorder, severe, dependence ([place]) 08/24/2019  . 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 MDD (major depressive disorder) 03/06/2019    No past surgical history on file.     Family History  Problem Relation Age of Onset  . Stroke Other     Social History   Tobacco Use  . Smoking status: Current Every Day Smoker    Packs/day: 1.00    Types: Cigarettes  . Smokeless tobacco: Never Used  Substance Use Topics  . Alcohol use: Yes    Comment: Drinks 3-4 times a week. Last drink: this AM  . Drug use: Yes    Frequency: 7.0 times per week    Types: "Crack" cocaine, Heroin, MDMA (Ecstacy), Marijuana    Comment: UDS not available at time of assessment    Home Medications Prior to Admission medications   Medication Sig Start Date End Date Taking? Authorizing Provider  FLUoxetine (PROZAC) 10 MG capsule Take 1 capsule (10 mg total) by mouth daily. 10/31/19   Connye Burkitt, NP  Multiple Vitamin (MULTIVITAMIN WITH MINERALS) TABS tablet Take 1 tablet  by mouth daily. Patient not taking: Reported on 11/07/2019 10/31/19   Aldean Baker, NP  QUEtiapine (SEROQUEL) 200 MG tablet Take 1 tablet (200 mg total) by mouth at bedtime. 10/30/19   Aldean Baker, NP    Allergies    Codeine  Review of Systems   Review of Systems  Constitutional: Negative for chills, diaphoresis and fever.  Respiratory: Negative for cough and shortness of breath.   Cardiovascular: Negative for chest pain.  Gastrointestinal: Negative for abdominal pain, diarrhea, nausea and vomiting.  Neurological: Negative for weakness and numbness.  Psychiatric/Behavioral: Positive for  agitation, behavioral problems, hallucinations and suicidal ideas.  All other systems reviewed and are negative.   Physical Exam Updated Vital Signs BP (!) 154/103 (BP Location: Right Arm)   Pulse (!) 131   Temp 98.1 F (36.7 C) (Oral)   Resp (!) 23   SpO2 100%   Physical Exam Vitals and nursing note reviewed.  Constitutional:      General: He is not in acute distress.    Appearance: He is well-developed. He is not diaphoretic.  HENT:     Head: Normocephalic and atraumatic.     Mouth/Throat:     Mouth: Mucous membranes are moist.     Pharynx: Oropharynx is clear.  Eyes:     Conjunctiva/sclera: Conjunctivae normal.  Cardiovascular:     Rate and Rhythm: Regular rhythm. Tachycardia present.     Pulses: Normal pulses.          Radial pulses are 2+ on the right side and 2+ on the left side.       Posterior tibial pulses are 2+ on the right side and 2+ on the left side.     Heart sounds: Normal heart sounds.     Comments: Tactile temperature in the extremities appropriate and equal bilaterally. Pulmonary:     Effort: Pulmonary effort is normal. No respiratory distress.     Breath sounds: Normal breath sounds.  Abdominal:     Palpations: Abdomen is soft.     Tenderness: There is no abdominal tenderness. There is no guarding.  Musculoskeletal:     Cervical back: Neck supple.     Right lower leg: No edema.     Left lower leg: No edema.     Comments: Overall trauma exam performed without any abnormalities noted other than those mentioned.  Lymphadenopathy:     Cervical: No cervical adenopathy.  Skin:    General: Skin is warm and dry.     Capillary Refill: Capillary refill takes less than 2 seconds.  Neurological:     Mental Status: He is alert.     Comments: Patient in handcuffs at presentation. Sensation light touch grossly intact in both hands throughout the fingers  Psychiatric:        Attention and Perception: He perceives auditory hallucinations.        Speech: Speech  normal.        Behavior: Behavior normal.     Comments: Patient appears to be responding to internal stimuli.  During our conversation, he will stop talking to me, turn his head away and down, and speak as if he is talking to someone else. "They will take care of Waldemar." "Yes, I trust him."      ED Results / Procedures / Treatments   Labs (all labs ordered are listed, but only abnormal results are displayed) Labs Reviewed  COMPREHENSIVE METABOLIC PANEL - Abnormal; Notable for the following components:  Result Value   Potassium 3.0 (*)    CO2 20 (*)    Glucose, Bld 117 (*)    Total Protein 8.2 (*)    AST 60 (*)    Anion gap 19 (*)    All other components within normal limits  ETHANOL - Abnormal; Notable for the following components:   Alcohol, Ethyl (B) 14 (*)    All other components within normal limits  RAPID URINE DRUG SCREEN, HOSP PERFORMED - Abnormal; Notable for the following components:   Cocaine POSITIVE (*)    Amphetamines POSITIVE (*)    Tetrahydrocannabinol POSITIVE (*)    All other components within normal limits  CBC WITH DIFFERENTIAL/PLATELET - Abnormal; Notable for the following components:   Hemoglobin 17.1 (*)    All other components within normal limits  SALICYLATE LEVEL - Abnormal; Notable for the following components:   Salicylate Lvl <7.0 (*)    All other components within normal limits  ACETAMINOPHEN LEVEL - Abnormal; Notable for the following components:   Acetaminophen (Tylenol), Serum <10 (*)    All other components within normal limits  RESPIRATORY PANEL BY RT PCR (FLU A&B, COVID)  CK    EKG EKG Interpretation  Date/Time:  Sunday November 10 2019 08:27:17 EST Ventricular Rate:  127 PR Interval:    QRS Duration: 83 QT Interval:  319 QTC Calculation: 464 R Axis:   76 Text Interpretation: Sinus tachycardia Borderline T wave abnormalities Confirmed by Kristine Royal [phone]) on 11/10/2019 9:24:33 AM   Radiology No results  found.  Procedures .Critical Care Performed by: Anselm Pancoast, PA-C Authorized by: Anselm Pancoast, PA-C   Critical care provider statement:    Critical care time (minutes):  45   Critical care time was exclusive of:  Separately billable procedures and treating other patients   Critical care was necessary to treat or prevent imminent or life-threatening deterioration of the following conditions: Stimulant intoxication; psychosis.   Critical care was time spent personally by me on the following activities:  Ordering and performing treatments and interventions, ordering and review of laboratory studies, pulse oximetry, re-evaluation of patient's condition, review of old charts, development of treatment plan with patient or surrogate, discussions with consultants, evaluation of patient's response to treatment, examination of patient and obtaining history from patient or surrogate   I assumed direction of critical care for this patient from another provider in my specialty: no     (including critical care time)  Medications Ordered in ED Medications  nicotine (NICODERM CQ - dosed in mg/24 hours) patch 21 mg (21 mg Transdermal Patch Applied 11/10/19 0853)  LORazepam (ATIVAN) injection 0-4 mg (4 mg Intravenous Not Given 11/10/19 1357)    Or  LORazepam (ATIVAN) tablet 0-4 mg ( Oral See Alternative 11/10/19 1357)  LORazepam (ATIVAN) injection 0-4 mg (has no administration in time range)    Or  LORazepam (ATIVAN) tablet 0-4 mg (has no administration in time range)  thiamine tablet 100 mg ( Oral See Alternative 11/10/19 1146)    Or  thiamine (B-1) injection 100 mg (100 mg Intravenous Given 11/10/19 1146)  sodium chloride 0.9 % bolus 1,000 mL (0 mLs Intravenous Stopped 11/10/19 1140)  OLANZapine zydis (ZYPREXA) disintegrating tablet 10 mg (10 mg Oral Given 11/10/19 0853)  LORazepam (ATIVAN) injection 2 mg (2 mg Intravenous Given 11/10/19 0954)  potassium chloride SA (KLOR-CON) CR tablet 40 mEq (40 mEq Oral Given  11/10/19 1041)  diphenhydrAMINE (BENADRYL) injection 50 mg (50 mg Intravenous Given 11/10/19 1041)  ED Course  I have reviewed the triage vital signs and the nursing notes.  Pertinent labs & imaging results that were available during my care of the patient were reviewed by me and considered in my medical decision making (see chart for details).  Clinical Course as of Nov 10 1547  Wynelle Link Nov 10, 2019  4580 Patient appears to be more agitated.  Fidgeting and changing positions in the bed frequently.  Restless in his movements, quickly touching things around him.   [SJ]  1033 Patient seems to be itching his skin and restless. No noted hives, respiratory distress, swelling.   [SJ]    Clinical Course User Index [SJ] Vadis Slabach, Hillard Danker, PA-C   MDM Rules/Calculators/A&P                      Patient presents with agitation.  He has a history of schizophrenia and has been noncompliant with his medications.  He has also been abusing multiple stimulant substances. He was shown to have little regard for his own safety. Due to his level of agitation and this disregard for his own safety, it was necessary to administer medications to keep the patient calm. Patient was also placed under IVC due to being a danger to himself and possibly to others. Initial creatinine normal. Low suspicion for rhabdomyolysis. Psych team evaluated the patient and recommended observation in the ED until sober.  Plan for reevaluation tomorrow (2/8) morning.  Findings and plan of care discussed with Kristine Royal, MD. Dr. Rodena Medin personally evaluated and examined this patient.  End of shift patient care handoff report given to Ronnie Doss, PA-C. Plan: Continue to reevaluate patient, using sedation medications as needed.  Check periodic labs.  Vitals:   11/10/19 1115 11/10/19 1156 11/10/19 1209 11/10/19 1320  BP:  (!) 146/124 (!) 152/99 (!) 141/112  Pulse: (!) 111   98  Resp:    (!) 23  Temp:      TempSrc:      SpO2: 100%    98%     Final Clinical Impression(s) / ED Diagnoses Final diagnoses:  Auditory hallucination  Agitation  Stimulant intoxication with delirium [facility])    Rx / DC Orders ED Discharge Orders    None       Anselm Pancoast, PA-C 11/10/19 1556

## 2019-11-10 NOTE — ED Notes (Signed)
Even though he is resting quietly, he remains very fidgety; and thus far whenever we attempt to release either of his soft restraints, he pulls at IV and monitor equipment.

## 2019-11-10 NOTE — BH Assessment (Signed)
Tele Assessment Note   Patient Name: Mark Hammond MRN: [id number] Referring Physician: Arlean Hopping, PA-C Location of Patient: [facility] Location of Provider: [place] is a 29 y.o. male who presented to [facility] (transported by police, voluntary basis) due to altered mental status and bizarre behavior.  Pt lives in [place] with a woman who is described as his ex-girlfriend.  He is unemployed, and he is not followed by an outpatient psychiatrist.  Pt was last assessed by TTS on 11/07/19.  At that time, Pt presented to [facility] with complaint of substance use, hallucination, and homicidal ideation.  Pt was discharged.  Today is Pt's fifth presentation to the ED this year.  [place] acting in a bizarre manner -- dancing and talking to himself.  EMS was called.  Pt became agitated and was transported in cuffs to the ED by [employer].  Per hospital notes, Pt told EMS staff that he thought about suicide by overdose, and that he was experiencing hallucination.  Pt was assessed by TTS and reported that he was not suicidal.  Instead, ''I'm really high.''  Pt reported that within the last 24 hours, he has ingested unknown quantities of crack cocaine, marijuana,meth, and alcohol.  Pt stated also that he is trying to obtain heroin because ''I want to keep getting high.''  Pt denied homicidal ideation, hallucination, and self-injurious behavior.  Pt endorsed numerous past suicide attempts in the past.  When asked why he got high, Pt reported that he had conflict with his ex-girlfriend.  Per history, Pt has a diagnosis of Schizoaffective Disorder, Bipolar I type.  During assessment, Pt presented as alert and oriented.  He had good eye contact.  Demeanor was hyperactive/restless.  Pt was dressed in street clothes, and he appeared disheveled.  Pt's mood and affect were preoccupied.  Pt's speech was rapid, but otherwise normal in rhythm and volume.  Thought processes were rapid,  and thought content was circumstantial. Pt's memory and concentration were fair.  Insight, judgment, and impulse control were poor.  Consulted with Berneta Levins, NP, who recommended that Pt remain in ED, sober, be observed, AM evaluation.  Diagnosis: Schizoaffective Disorder, Bipolar I type; Polysubstance Use  Past Medical History:  Past Medical History:  Diagnosis Date  . Bipolar 1 disorder ([place])   . Dental abscess   . Depression   . Schizo affective schizophrenia ([place])     No past surgical history on file.  Family History:  Family History  Problem Relation Age of Onset  . Stroke Other     Social History:  reports that he has been smoking cigarettes. He has been smoking about 1.00 pack per day. He has never used smokeless tobacco. He reports current alcohol use. He reports current drug use. Frequency: 7.00 times per week. Drugs: "Crack" cocaine, Heroin, MDMA (Ecstacy), and Marijuana.  Additional Social History:  Alcohol / Drug Use Pain Medications: See MAR Prescriptions: See MAR Over the Counter: See MAR History of alcohol / drug use?: Yes Substance #1 Name of Substance 1: Crack cocaine 1 - Amount (size/oz): Varied 1 - Duration: Ongoing 1 - Last Use / Amount: 11/10/19 Substance #2 Name of Substance 2: Meth 2 - Amount (size/oz): Varied 2 - Duration: Ongoing 2 - Last Use / Amount: 11/10/19 Substance #3 Name of Substance 3: Marijuana 3 - Amount (size/oz): VAried 3 - Frequency: Daily 3 - Duration: Ongoing 3 - Last Use / Amount: 11/10/19 Substance #4 Name of  Substance 4: Heroin 4 - Last Use / Amount: Not sure Substance #5 Name of Substance 5: Alcohol 5 - Amount (size/oz): Varied 5 - Frequency: Daily 5 - Duration: Ongoing 5 - Last Use / Amount: 11/10/19  CIWA: CIWA-Ar BP: (!) 149/96 Pulse Rate: (!) 113 Nausea and Vomiting: no nausea and no vomiting Tactile Disturbances: very mild itching, pins and needles, burning or numbness Tremor: not visible, but can be felt  fingertip to fingertip Auditory Disturbances: continuous hallucinations Paroxysmal Sweats: two Visual Disturbances: not present Anxiety: three Headache, Fullness in Head: none present Agitation: six Orientation and Clouding of Sensorium: cannot do serial additions or is uncertain about date CIWA-Ar Total: 21 COWS:    Allergies:  Allergies  Allergen Reactions  . Codeine Other (See Comments)    Patient states parents told him he was allergic at a young age.    Home Medications: (Not in a hospital admission)   OB/GYN Status:  No LMP for male patient.  General Assessment Data Location of Assessment: [facility] TTS Assessment: In system Is this a Tele or Face-to-Face Assessment?: Tele Assessment Is this an Initial Assessment or a Re-assessment for this encounter?: Initial Assessment Patient Accompanied by:: N/A(Transported by police) Language Other than English: No Living Arrangements: Other (Comment) What gender do you identify as?: Male Marital status: Long term relationship Pregnancy Status: No Living Arrangements: Other (Comment)(Lives with on-again/off-again girlfriend) Can pt return to current living arrangement?: Yes Admission Status: Voluntary Is patient capable of signing voluntary admission?: Yes Referral Source: Other([employer]) Insurance type: None     Crisis Care Plan Living Arrangements: Other (Comment)(Lives with on-again/off-again girlfriend) Name of Psychiatrist: None Name of Therapist: None  Education Status Is patient currently in school?: No Is the patient employed, unemployed or receiving disability?: Unemployed  Risk to self with the past 6 months Suicidal Ideation: No(Pt denied, but see notes) Has patient been a risk to self within the past 6 months prior to admission? : No Suicidal Intent: No Has patient had any suicidal intent within the past 6 months prior to admission? : No Is patient at risk for suicide?: No Suicidal Plan?: No(Pt denied, but see  notes) Has patient had any suicidal plan within the past 6 months prior to admission? : No Specify Current Suicidal Plan: Pt told EMS he would overdose Access to Means: Yes Specify Access to Suicidal Means: Street drugs What has been your use of drugs/alcohol within the last 12 months?: Crack, alcohol, marijuana, meth, ecstasy Previous Attempts/Gestures: Yes How many times?: (''Lots of time'') Triggers for Past Attempts: Other (Comment)(Hallucinations) Intentional Self Injurious Behavior: None Family Suicide History: No Recent stressful life event(s): Conflict (Comment)(Conflict with ex-girlfriend) Persecutory voices/beliefs?: No Depression: No Substance abuse history and/or treatment for substance abuse?: Yes Suicide prevention information given to non-admitted patients: Not applicable  Risk to Others within the past 6 months Homicidal Ideation: No-Not Currently/Within Last 6 Months Does patient have any lifetime risk of violence toward others beyond the six months prior to admission? : No Thoughts of Harm to Others: No-Not Currently Present/Within Last 6 Months Current Homicidal Intent: No-Not Currently/Within Last 6 Months Current Homicidal Plan: No Access to Homicidal Means: No History of harm to others?: No Assessment of Violence: None Noted Criminal Charges Pending?: Yes Describe Pending Criminal Charges: Possession, 2nd degree trespass, injury to personal property Does patient have a court date: Yes Court Date: 11/14/19 Is patient on probation?: No  Psychosis Hallucinations: None noted(Pt denied, but see notes) Delusions: None noted  Mental Status  Report Appearance/Hygiene: Disheveled, Other (Comment)(Street clothes) Eye Contact: Good Motor Activity: Freedom of movement, Hyperactivity Speech: Logical/coherent Level of Consciousness: Alert Mood: Preoccupied Affect: Preoccupied Anxiety Level: Minimal Thought Processes: Circumstantial Judgement:  Impaired Orientation: Person, Place, Time, Situation Obsessive Compulsive Thoughts/Behaviors: None  Cognitive Functioning Concentration: Normal Is patient IDD: No Insight: Poor Impulse Control: Poor Appetite: Poor Have you had any weight changes? : No Change Sleep: Unable to Assess Vegetative Symptoms: None  ADLScreening [facility] Assessment Services) Patient's cognitive ability adequate to safely complete daily activities?: Yes Patient able to express need for assistance with ADLs?: Yes Independently performs ADLs?: Yes (appropriate for developmental age)  Prior Inpatient Therapy Prior Inpatient Therapy: Yes Prior Therapy Dates: 03/2019, multiple admits Prior Therapy Facilty/Provider(s): [facility] and other facilities Reason for Treatment: schizophrenia and SA  Prior Outpatient Therapy Prior Outpatient Therapy: No Does patient have an ACCT team?: No Does patient have Intensive In-House Services?  : No Does patient have [facility] services? : No Does patient have P4CC services?: No  ADL Screening (condition at time of admission) Patient's cognitive ability adequate to safely complete daily activities?: Yes Is the patient deaf or have difficulty hearing?: No Does the patient have difficulty seeing, even when wearing glasses/contacts?: No Patient able to express need for assistance with ADLs?: Yes Does the patient have difficulty dressing or bathing?: No Independently performs ADLs?: Yes (appropriate for developmental age) Does the patient have difficulty walking or climbing stairs?: No Weakness of Legs: None Weakness of Arms/Hands: None  Home Assistive Devices/Equipment Home Assistive Devices/Equipment: None  Therapy Consults (therapy consults require a physician order) PT Evaluation Needed: No OT Evalulation Needed: No SLP Evaluation Needed: No Abuse/Neglect Assessment (Assessment to be complete while patient is alone) Abuse/Neglect Assessment Can Be Completed: Yes Physical  Abuse: Denies Verbal Abuse: Denies Sexual Abuse: Denies Exploitation of patient/patient's resources: Denies Self-Neglect: Denies Values / Beliefs Cultural Requests During Hospitalization: None Spiritual Requests During Hospitalization: None Consults Spiritual Care Consult Needed: No Transition of Care Team Consult Needed: No Advance Directives (For Healthcare) Does Patient Have a Medical Advance Directive?: No Would patient like information on creating a medical advance directive?: No - Patient declined          Disposition:  Disposition Initial Assessment Completed for this Encounter: Yes Patient referred to: Other (Comment)(Remain in ED, sober, AM eval)  This service was provided via telemedicine using a 2-way, interactive audio and video technology.  Names of all persons participating in this telemedicine service and their role in this encounter. Name: Malin Cervini Role: Pt             Earline Mayotte 11/10/2019 9:53 AM

## 2019-11-10 NOTE — ED Provider Notes (Signed)
  Physical Exam  BP 134/69   Pulse 84   Temp 98.1 F (36.7 C) (Oral)   Resp 17   SpO2 100%   Physical Exam Vitals and nursing note reviewed.  Constitutional:      Appearance: Normal appearance.  HENT:     Head: Normocephalic.  Eyes:     Conjunctiva/sclera: Conjunctivae normal.  Pulmonary:     Effort: Pulmonary effort is normal.  Skin:    General: Skin is dry.  Neurological:     Mental Status: He is alert.     ED Course/Procedures   Clinical Course as of Nov 09 2242  Wynelle Link Nov 10, 2019  1660 Patient appears to be more agitated.  Fidgeting and changing positions in the bed frequently.  Restless in his movements, quickly touching things around him.   [SJ]  1033 Patient seems to be itching his skin and restless. No noted hives, respiratory distress, swelling.   [SJ]  1920 Patient continues to be observed. Sleeping soundly, normal vitals. Did have very slightly elevated CK to 412. Will order repeat labs and fluid bolus at this time.    [KM]    Clinical Course User Index [KM] Arlyn Dunning, PA-C [SJ] Anselm Pancoast, PA-C    Procedures  MDM  Patient care assumed from [name] ue to change of shift.  Please see his note for complete HPI and work-up.  Briefly, patient is a 29 year old male presenting to the emergency department for polysubstance abuse with multiple drugs on board picked up at a bus station by police for hallucinations and unruly behavior.  Patient has been uncooperative with active hallucinations and is deemed to be a threat to himself and others so he was IVC in place in restraints.  Patient has ongoing order for Ativan at this time.  Lab work-up has been unremarkable.  Patient was seen and evaluated by behavioral health who would like to monitor the patient until the morning and reevaluate at that time. When I exam the patient he is sleeping soundly.   Patient remaining cooperative. Has not had ativan since around 330 pm today. Will d/c ativan at this time  so he is not sedated for [facility] reassessment in the morning. Will sign out care to overnight PA in case he becomes more agitated.      Jeral Pinch 11/10/19 2249    Terrilee Files, MD 11/12/19 0900

## 2019-11-10 NOTE — ED Notes (Signed)
He remains very agitated, but in no distress. 2nd Ativan dose given. He capably uses TV remote; and is oriented to his name & situation.

## 2019-11-10 NOTE — ED Triage Notes (Signed)
He was seen to be acting erratically at a bus stop and EMS were called. He tells [place] he is "hearing things; and I have asked God to stop th e voices, but the voices won't stop" [sic]. He arrives asake and alert with two [employer] officers in handcuffs.

## 2019-11-10 NOTE — ED Notes (Signed)
He remains agitated, his handcuffs were removed and police have left.

## 2019-11-11 ENCOUNTER — Encounter (HOSPITAL_COMMUNITY): Payer: Self-pay | Admitting: Registered Nurse

## 2019-11-11 NOTE — ED Notes (Signed)
Patient requesting to leave. Patient continue to come out of his room.   Redirected patient back to his room.   Waiting for IVC papers to be resent.

## 2019-11-11 NOTE — BHH Suicide Risk Assessment (Cosign Needed Addendum)
Suicide Risk Assessment  Discharge Assessment   [facility] Discharge Suicide Risk Assessment   Principal Problem: Substance induced mood disorder (HCC) Discharge Diagnoses: Principal Problem:   Substance induced mood disorder (HCC) Active Problems:   Alcohol use disorder, severe, dependence (HCC)   Schizoaffective disorder, bipolar type (HCC)   Polysubstance (including opioids) dependence, daily use (HCC)   Total Time spent with patient: 30 minutes  Musculoskeletal: Strength & Muscle Tone: within normal limits Gait & Station: normal Patient leans: N/A  Psychiatric Specialty Exam:   Blood pressure 126/67, pulse 88, temperature 98.1 F (36.7 C), temperature source Oral, resp. rate 17, SpO2 98 %.There is no height or weight on file to calculate BMI.  General Appearance: Casual  Eye Contact::  Good  Speech:  Clear and Coherent and Normal Rate409  Volume:  Normal  Mood:  "Better."  Appropriate  Affect:  Appropriate and Congruent  Thought Process:  Coherent, Goal Directed and Descriptions of Associations: Intact  Orientation:  Full (Time, Place, and Person)  Thought Content:  WDL  Suicidal Thoughts:  No  Homicidal Thoughts:  No  Memory:  Immediate;   Good Recent;   Good  Judgement:  Intact  Insight:  Present  Psychomotor Activity:  Normal  Concentration:  Good  Recall:  Good  Fund of Knowledge:Good  Language: Good  Akathisia:  No  Handed:  Right  AIMS (if indicated):     Assets:  Communication Skills Desire for Improvement Housing  Sleep:     Cognition: WNL  ADL's:  Intact   Mental Status Per Nursing Assessment::   On Admission:    Mark Hammond, 29 y.o., male patient seen via tele psych by this provider, Dr. Lucianne Muss; and chart reviewed on 11/11/19.  On evaluation Mark Hammond reports that he is feeling better this morning.  Patient states that he was brought to the hospital because e he was intoxicated at the bus station.  Patient states that he is interested in outpatient  substance use services; but has a court date this morning that he needs to get to.  Patient denies suicidal/self-harm/homicidal ideation, psychosis, and paranoia.   During evaluation Mark Hammond is alert/oriented x 4; calm/cooperative; and mood is congruent with affect.  He does not appear to be responding to internal/external stimuli or delusional thoughts.  Patient denies suicidal/self-harm/homicidal ideation, psychosis, and paranoia.  Patient answered question appropriately.  Peer support order for referral and resources for community services and rehab facilities.      Demographic Factors:  Male and Caucasian  Loss Factors: NA  Historical Factors: NA  Risk Reduction Factors:   Religious beliefs about death  Continued Clinical Symptoms:  Alcohol/Substance Abuse/Dependencies  Cognitive Features That Contribute To Risk:  None    Suicide Risk:  Minimal: No identifiable suicidal ideation.  Patients presenting with no risk factors but with morbid ruminations; may be classified as minimal risk based on the severity of the depressive symptoms  Plan Of Care/Follow-up recommendations:  Activity:  As tolerated Diet:  Heart healthy   Disposition:  Patient psychiatrically cleared No evidence of imminent risk to self or others at present.   Patient does not meet criteria for psychiatric inpatient admission. Supportive therapy provided about ongoing stressors. Discussed crisis plan, support from social network, calling 911, coming to the Emergency Department, and calling Suicide Hotline.  Mark Pullman, NP 11/11/2019, 10:08 AM

## 2019-11-11 NOTE — ED Notes (Signed)
Patient is being discharged. Patient needs IVC papers resent before able to leave.

## 2019-11-11 NOTE — ED Notes (Signed)
Patient given belongings (2 bags) and bus pass.

## 2019-11-11 NOTE — ED Notes (Signed)
1:1 sitter orders placed for patient.

## 2019-11-11 NOTE — ED Notes (Signed)
Patient given water and sandwich per request. Covered with warm blankets

## 2019-11-15 ENCOUNTER — Inpatient Hospital Stay (HOSPITAL_COMMUNITY)
Admission: AD | Admit: 2019-11-15 | Discharge: 2019-11-18 | DRG: 885 | Disposition: A | Discharge status: Home / Self Care | Payer: Federal, State, Local not specified - Other | Attending: Psychiatry | Admitting: Psychiatry

## 2019-11-15 ENCOUNTER — Encounter (HOSPITAL_COMMUNITY): Payer: Self-pay | Admitting: Psychiatric/Mental Health

## 2019-11-15 ENCOUNTER — Other Ambulatory Visit: Payer: Self-pay

## 2019-11-15 DIAGNOSIS — Z560 Unemployment, unspecified: Secondary | ICD-10-CM

## 2019-11-15 DIAGNOSIS — R45850 Homicidal ideations: Secondary | ICD-10-CM | POA: Diagnosis present

## 2019-11-15 DIAGNOSIS — Z20822 Contact with and (suspected) exposure to covid-19: Secondary | ICD-10-CM | POA: Diagnosis present

## 2019-11-15 DIAGNOSIS — F17210 Nicotine dependence, cigarettes, uncomplicated: Secondary | ICD-10-CM | POA: Diagnosis present

## 2019-11-15 DIAGNOSIS — R45851 Suicidal ideations: Secondary | ICD-10-CM | POA: Diagnosis present

## 2019-11-15 DIAGNOSIS — R748 Abnormal levels of other serum enzymes: Secondary | ICD-10-CM | POA: Diagnosis present

## 2019-11-15 DIAGNOSIS — F250 Schizoaffective disorder, bipolar type: Secondary | ICD-10-CM | POA: Diagnosis present

## 2019-11-15 DIAGNOSIS — Z9114 Patient's other noncompliance with medication regimen: Secondary | ICD-10-CM | POA: Diagnosis not present

## 2019-11-15 DIAGNOSIS — Z823 Family history of stroke: Secondary | ICD-10-CM

## 2019-11-15 DIAGNOSIS — Z915 Personal history of self-harm: Secondary | ICD-10-CM | POA: Diagnosis not present

## 2019-11-15 DIAGNOSIS — F209 Schizophrenia, unspecified: Secondary | ICD-10-CM | POA: Diagnosis present

## 2019-11-15 DIAGNOSIS — Z79899 Other long term (current) drug therapy: Secondary | ICD-10-CM

## 2019-11-15 DIAGNOSIS — F1510 Other stimulant abuse, uncomplicated: Secondary | ICD-10-CM | POA: Diagnosis present

## 2019-11-15 DIAGNOSIS — F200 Paranoid schizophrenia: Secondary | ICD-10-CM

## 2019-11-15 DIAGNOSIS — Z590 Homelessness: Secondary | ICD-10-CM | POA: Diagnosis not present

## 2019-11-15 DIAGNOSIS — Z885 Allergy status to narcotic agent status: Secondary | ICD-10-CM

## 2019-11-15 LAB — RESPIRATORY PANEL BY RT PCR (FLU A&B, COVID)
Influenza A by PCR: NEGATIVE
Influenza B by PCR: NEGATIVE
SARS Coronavirus 2 by RT PCR: NEGATIVE

## 2019-11-15 MED ORDER — MAGNESIUM HYDROXIDE 400 MG/5ML PO SUSP: 30.0000 mL | Freq: Every day | ORAL | Status: DC | PRN

## 2019-11-15 MED ORDER — ALUM & MAG HYDROXIDE-SIMETH 200-200-20 MG/5ML PO SUSP
30.0000 mL | ORAL | Status: DC | PRN
  Administered 2019-11-16: 30 mL via ORAL

## 2019-11-15 MED ORDER — BENZTROPINE MESYLATE 0.5 MG PO TABS
0.5000 mg | ORAL_TABLET | Freq: Two times a day (BID) | ORAL | Status: DC
  Administered 2019-11-16 – 2019-11-18 (×5): 0.5 mg via ORAL
  Filled 2019-11-15 (×3): qty 1
  Filled 2019-11-15 (×3): qty 14
  Filled 2019-11-15 (×2): qty 1
  Filled 2019-11-15: qty 14
  Filled 2019-11-15 (×3): qty 1

## 2019-11-15 MED ORDER — GABAPENTIN 300 MG PO CAPS
300.0000 mg | ORAL_CAPSULE | Freq: Three times a day (TID) | ORAL | Status: DC
  Administered 2019-11-15 – 2019-11-18 (×9): 300 mg via ORAL
  Filled 2019-11-15 (×4): qty 1
  Filled 2019-11-15: qty 21
  Filled 2019-11-15 (×3): qty 1
  Filled 2019-11-15: qty 21
  Filled 2019-11-15 (×3): qty 1
  Filled 2019-11-15: qty 21
  Filled 2019-11-15 (×2): qty 1
  Filled 2019-11-15 (×3): qty 21

## 2019-11-15 MED ORDER — HYDROXYZINE HCL 25 MG PO TABS
25.0000 mg | ORAL_TABLET | Freq: Three times a day (TID) | ORAL | Status: DC | PRN
  Administered 2019-11-16: 25 mg via ORAL
  Filled 2019-11-15 (×2): qty 1

## 2019-11-15 MED ORDER — RISPERIDONE 2 MG PO TABS
2.0000 mg | ORAL_TABLET | Freq: Two times a day (BID) | ORAL | Status: DC
  Filled 2019-11-15 (×2): qty 1

## 2019-11-15 MED ORDER — RISPERIDONE 3 MG PO TABS
3.0000 mg | ORAL_TABLET | Freq: Two times a day (BID) | ORAL | Status: DC
  Administered 2019-11-15 – 2019-11-18 (×6): 3 mg via ORAL
  Filled 2019-11-15 (×2): qty 1
  Filled 2019-11-15: qty 14
  Filled 2019-11-15: qty 1
  Filled 2019-11-15: qty 14
  Filled 2019-11-15 (×5): qty 1
  Filled 2019-11-15 (×2): qty 14
  Filled 2019-11-15: qty 1

## 2019-11-15 MED ORDER — TRAZODONE HCL 100 MG PO TABS
200.0000 mg | ORAL_TABLET | Freq: Every evening | ORAL | Status: DC | PRN
  Administered 2019-11-16 – 2019-11-17 (×2): 200 mg via ORAL
  Filled 2019-11-15 (×3): qty 2

## 2019-11-15 MED ORDER — ACETAMINOPHEN 325 MG PO TABS
650.0000 mg | ORAL_TABLET | Freq: Four times a day (QID) | ORAL | Status: DC | PRN
  Administered 2019-11-18: 650 mg via ORAL
  Filled 2019-11-15 (×2): qty 2

## 2019-11-15 NOTE — Progress Notes (Signed)
[place] NOVEL CORONAVIRUS (COVID-19) DAILY CHECK-OFF SYMPTOMS - answer yes or no to each - every day NO YES  Have you had a fever in the past 24 hours?  . Fever (Temp > 37.80C / 100F) X   Have you had any of these symptoms in the past 24 hours? . New Cough .  Sore Throat  .  Shortness of Breath .  Difficulty Breathing .  Unexplained Body Aches   X   Have you had any one of these symptoms in the past 24 hours not related to allergies?   . Runny Nose .  Nasal Congestion .  Sneezing   X   If you have had runny nose, nasal congestion, sneezing in the past 24 hours, has it worsened?  X   EXPOSURES - check yes or no X   Have you traveled outside the state in the past 14 days?  X   Have you been in contact with someone with a confirmed diagnosis of COVID-19 or PUI in the past 14 days without wearing appropriate PPE?  X   Have you been living in the same home as a person with confirmed diagnosis of COVID-19 or a PUI (household contact)?    X   Have you been diagnosed with COVID-19?    X              What to do next: Answered NO to all: Answered YES to anything:   Proceed with unit schedule Follow the [facility] Inpatient Flowsheet.   Samarah Hogle K. Keni Wafer MSN, RN, [facility] [phone] 

## 2019-11-15 NOTE — BH Assessment (Addendum)
[facility] Assessment Progress Note  Per Mark Hammond, this voluntary pt requires psychiatric hospitalization at this time.  Mark Hammond has assigned pt to [facility] Rm 500-1.  Pt's nurse, Mark Hammond, has been notified.  Mark Hammond, [place] [facility]al Health Coordinator [phone]

## 2019-11-15 NOTE — BHH Suicide Risk Assessment (Signed)
[facility] Admission Suicide Risk Assessment   Nursing information obtained from:  Patient Demographic factors:  Male, Adolescent or young adult, Caucasian, Low socioeconomic status, Unemployed Current Mental Status:  Suicidal ideation indicated by patient Loss Factors:  Decrease in vocational status, Financial problems / change in socioeconomic status Historical Factors:  Prior suicide attempts Risk Reduction Factors:  Living with another person, especially a relative  Total Time spent with patient: 45 minutes Principal Problem: Schizoaffective disorder, bipolar type (HCC) Diagnosis:  Principal Problem:   Schizoaffective disorder, bipolar type (HCC) Active Problems:   Schizophrenia (HCC)  Subjective Data:  This is the latest of multiple admissions/healthcare system encounters for [place], he is 47 and has a chronic substance abuse history which has induced a chronic psychotic disorder.  He presented on 2/7 under petition but was released on 2/8, only to present again on 2/12 with a cluster of psychotic symptoms and self-harm/multiple self-inflicted wounds on his arms.  He states it is "religious" reports recent auditory hallucinations, reports seeing "crosses pentagrams and the #6 over and over"  When asked what drugs the patient is abusing he states "everything but heroin" and on the 2/7 presentation drug screen reflects abuse of methamphetamines, cocaine, and cannabis.  Blood alcohol level on 2/7 was 14-  Patient continues to express delusional believes states he cut himself because he is "religious". Continued Clinical Symptoms:  Alcohol Use Disorder Identification Test Final Score (AUDIT): 13 The "Alcohol Use Disorders Identification Test", Guidelines for Use in Primary Care, Second Edition.  World Science writer [facility]). Score between 0-7:  no or low risk or alcohol related problems. Score between 8-15:  moderate risk of alcohol related problems. Score between 16-19:  high risk of alcohol  related problems. Score 20 or above:  warrants further diagnostic evaluation for alcohol dependence and treatment.   CLINICAL FACTORS:   Alcohol/Substance Abuse/Dependencies   Musculoskeletal: Strength & Muscle Tone: within normal limits Gait & Station: normal Patient leans: N/A  Psychiatric Specialty Exam: Physical Exam  Nursing note and vitals reviewed. Constitutional: He appears well-developed and well-nourished.  Cardiovascular: Normal rate and regular rhythm.   multiple superficial cuts on arms/forearms  Review of Systems  Constitutional: Negative.   Eyes: Negative.   Respiratory: Negative.   Cardiovascular: Negative.   Endocrine: Negative.   Genitourinary: Negative.   Neurological: Negative.     Blood pressure 122/79, pulse 93, temperature 98.2 F (36.8 C), temperature source Oral, resp. rate 16, SpO2 97 %.There is no height or weight on file to calculate BMI.  General Appearance: Casual and Disheveled  Eye Contact:  Poor  Speech:  Normal Rate  Volume:  Decreased  Mood:  Dysphoric  Affect:  Constricted  Thought Process:  Irrelevant and Descriptions of Associations: Circumstantial  Orientation:  Full (Time, Place, and Person)  Thought Content:  Illogical, Delusions and Hallucinations: Auditory Visual  Suicidal Thoughts:  Yes.  without intent/plan based on recent actions but denies current thoughts plans or intent now and can contract for safety and despite psychosis understands what it means to contract for safety  Homicidal Thoughts:  No  Memory:  Immediate;   Poor Recent;   Fair Remote;   Fair  Judgement:  Fair  Insight:  Shallow  Psychomotor Activity:  Normal  Concentration:  Concentration: Good and Attention Span: Fair  Recall:  Good but clearly when impaired has blackouts  Fund of Knowledge:  Fair  Language:  Fair  Akathisia:  Negative  Handed:  Right  AIMS (if indicated):  Assets:  Physical Health Resilience  ADL's: Not currently impaired to the  degree of compromise  Cognition:  WNL  Sleep:       COGNITIVE FEATURES THAT CONTRIBUTE TO RISK:  Loss of executive function    SUICIDE RISK:   Moderate:  Frequent suicidal ideation with limited intensity, and duration, some specificity in terms of plans, no associated intent, good self-control, limited dysphoria/symptomatology, some risk factors present, and identifiable protective factors, including available and accessible social support.  PLAN OF CARE: See eval  I certify that inpatient services furnished can reasonably be expected to improve the patient's condition.   [name], MD 11/15/2019, 3:47 PM

## 2019-11-15 NOTE — H&P (Signed)
[facility] Provider Admission PAA/H&P  Patient Identification: Mark Hammond MRN:  [id number] Date of Evaluation:  11/15/2019 Chief Complaint:  schitzophernia Principal Diagnosis: Schizoaffective disorder, bipolar type ([place]) Diagnosis:  Principal Problem:   Schizoaffective disorder, bipolar type ([place])  History of Present Illness: Mark Hammond, 29 y.o., male patient seen face to face by this provider, and chart reviewed on 11/15/19.  On evaluation Mark Hammond reports that has been hearing voices and seeing things. He states the voices are saying "really bad things to him". Currently he states he is suicidal and homicidal with no particular plan.  He also states that he has multiple personalities and that they are beginning to get out of control. He admits to drug use within the past 24 hours, ie, crack and alcohol. He states he does every drug but heroin.  Patient states he was here 2 weeks ago and stayed for about 4 days. Upon discharge he was to follow-up with outpatient care, which he denies doing. He is disheveled in appearance and states that he is hungry. Patient has asked for breakfast.   During evaluation Mark Hammond is alert/oriented x 4; depressed/calm/anxious and cooperative; and mood is congruent with affect.  He does not appear to be responding to internal/external stimuli or delusional thoughts, but endorses that he has them regularly.  Patient endorses suicidal/self and harm/homicidal ideation.  Patient appears paranoid as he states he cannot stop the voices in his head.  Associated Signs/Symptoms: Depression Symptoms:  depressed mood, hopelessness, suicidal thoughts with specific plan, (Hypo) Manic Symptoms:  Delusions, Hallucinations, Impulsivity, Anxiety Symptoms:  na Psychotic Symptoms:  Hallucinations: Auditory Visual PTSD Symptoms: NA Total Time spent with patient: 45 minutes  Past Psychiatric History: hx of schizophrenia  Is the patient at risk to self? Yes.     Has the patient been a risk to self in the past 6 months? Yes.    Has the patient been a risk to self within the distant past? Yes.    Is the patient a risk to others? Yes.    Has the patient been a risk to others in the past 6 months? Yes.    Has the patient been a risk to others within the distant past? Yes.     Prior Inpatient Therapy:   Prior Outpatient Therapy:    Alcohol Screening:   Substance Abuse History in the last 12 months:  Yes.   Consequences of Substance Abuse: NA Previous Psychotropic Medications: Yes  Psychological Evaluations: Yes  Past Medical History:  Past Medical History:  Diagnosis Date  . Bipolar 1 disorder ([place])   . Dental abscess   . Depression   . Schizo affective schizophrenia ([place])    No past surgical history on file. Family History:  Family History  Problem Relation Age of Onset  . Stroke Other    Family Psychiatric History: unknown Tobacco Screening:   Social History:  Social History   Substance and Sexual Activity  Alcohol Use Yes   Comment: Drinks 3-4 times a week. Last drink: this AM     Social History   Substance and Sexual Activity  Drug Use Yes  . Frequency: 7.0 times per week  . Types: "Crack" cocaine, Heroin, MDMA (Ecstacy), Marijuana   Comment: UDS not available at time of assessment    Additional Social History: Marital status: Long term relationship    Pain Medications: see MAR Prescriptions: see MAR Over the Counter: see MAR  Allergies:   Allergies  Allergen Reactions  . Codeine Other (See Comments)    Patient states parents told him he was allergic at a young age.   Lab Results: No results found for this or any previous visit (from the past 48 hour(s)).  Blood Alcohol level:  Lab Results  Component Value Date   ETH 14 (H) 11/10/2019   ETH <10 11/07/2019    Metabolic Disorder Labs:  Lab Results  Component Value Date   HGBA1C 5.1 10/29/2019   MPG 99.67 10/29/2019   MPG 99.67  03/07/2019   No results found for: PROLACTIN Lab Results  Component Value Date   CHOL 134 10/29/2019   TRIG 107 10/29/2019   HDL 60 10/29/2019   CHOLHDL 2.2 10/29/2019   VLDL 21 10/29/2019   LDLCALC 53 10/29/2019   LDLCALC 41 03/07/2019    Current Medications: Current Outpatient Medications  Medication Sig Dispense Refill  . FLUoxetine (PROZAC) 10 MG capsule Take 1 capsule (10 mg total) by mouth daily. 30 capsule 0  . Multiple Vitamin (MULTIVITAMIN WITH MINERALS) TABS tablet Take 1 tablet by mouth daily. (Patient not taking: Reported on 11/07/2019)    . QUEtiapine (SEROQUEL) 200 MG tablet Take 1 tablet (200 mg total) by mouth at bedtime. 30 tablet 0   No current facility-administered medications for this encounter.   PTA Medications: (Not in a hospital admission)   Musculoskeletal: Strength & Muscle Tone: within normal limits Gait & Station: normal Patient leans: N/A  Psychiatric Specialty Exam: Physical Exam  Nursing note and vitals reviewed. Constitutional: He appears well-developed.  HENT:  Head: Normocephalic.  Respiratory: Effort normal.  Musculoskeletal:        General: Normal range of motion.     Cervical back: Normal range of motion.  Neurological: He is alert.  Skin: Skin is warm and dry.  Fresh cuts on his left forearm  Psychiatric: His speech is normal. His mood appears anxious. He is actively hallucinating. Cognition and memory are normal. He expresses impulsivity and inappropriate judgment. He exhibits a depressed mood. He expresses homicidal and suicidal ideation.    Review of Systems  Skin: Positive for wound.  Psychiatric/Behavioral: Positive for hallucinations, self-injury and suicidal ideas.  All other systems reviewed and are negative.   There were no vitals taken for this visit.There is no height or weight on file to calculate BMI.  General Appearance: Disheveled  Eye Contact:  Fair  Speech:  Normal Rate  Volume:  Normal  Mood:  Anxious and  Dysphoric  Affect:  Congruent  Thought Process:  Disorganized and Descriptions of Associations: Tangential  Orientation:  Full (Time, Place, and Person)  Thought Content:  Illogical and Delusions  Suicidal Thoughts:  Yes.  with intent/plan  Homicidal Thoughts:  Yes.  without intent/plan  Memory:  NA  Judgement:  Impaired  Insight:  Lacking  Psychomotor Activity:  Normal  Concentration:  Concentration: Fair  Recall:  Fair  Fund of Knowledge:  Poor  Language:  Fair  Akathisia:  NA  Handed:  Right  AIMS (if indicated):     Assets:  Desire for Improvement  ADL's:  Intact  Cognition:  WNL  Sleep:         Treatment Plan Summary: Daily contact with patient to assess and evaluate symptoms and progress in treatment, Medication management and Plan reassessment in the am to determine appropriateness for inpatient hospitalizaton  Observation Level/Precautions:  15 minute checks Laboratory:  CBC Chemistry Profile Folic Acid GGT HbAIC HCG UDS  UA Psychotherapy:   Medications:  To be determined by inpatient psychiatrist Consultations:  Social work consult  Discharge Concerns:  Patient may have difficulty obtaining medications due to homeless state of being  Estimated LOS: TBD Other:      Jearld Lesch, NP 2/12/20216:25 AM

## 2019-11-15 NOTE — Discharge Summary (Signed)
Patient d/c from [facility] observation and admitted to [facility] inpatient [address] hall

## 2019-11-15 NOTE — BH Assessment (Signed)
Assessment Note  Mark Hammond is an 29 y.o. male. Pt presents to [facility] as a walk in voluntarily unaccompanied for suicidal thoughts and hallucinations. Pt states he is having repetitive SI thoughts and HI thoughts. Pt states that he thought about overdosing today and has had SI attempts in the past. Pt states he constantly thinks about religion, and has hallucinations of seeing the number "666" as well as crosses. 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 Pt states that he hears voices as well and this is constant and ongoing for him. Pt states that AVH are not commanding him to do anything. Pt was last assessed and admitted to [facility] on 11/10/19 for SI and hallucinations. Pt has a history of presenting to the hospital multiple times for similar presentation, this will be his sixth assessment in 2021. Pt currently reports he last used drugs around 9pm, states he used crack and drank a few beers. During assessment pt presented with strong body odor of alcohol. Per pt chart history, pt has abused other drugs such as cocaine, meth and marijuana. Pt currently states that he also engaged in self injurious behaviors 2 days ago, by cutting himself. Pt presents with visible superficial cuts on his left forearm. Pt endorsed the following symptoms of depression such as hopelessness, worthlessness, anxiety and irritability. Pt reports getting 7 to 9 hours a sleep night but a poor appetite. Pt currently reports he is homeless and lives with his girlfriend on and off over the last few years. 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 Pt reports his current stressor as having relationship issues with his girlfriend no other stressors. Pt denies any experience of trauma/abuse. Pt reports he is taking Prozac and Seroquel but last took these medications 1 week ago. Pt reports he does not have a provider and was prescribed medications from last hospital admission.   During  assessment, Pt presented as quiet but awake, he had body odor of alcohol.  He had fair eye contact.  Demeanor was hyperactive/restless.  Pt was dressed in street clothes, and he appeared disheveled.  Pt's mood and affect were preoccupied.  Pt's speech was rapid, but otherwise normal in rhythm and volume.  Thought processes were rapid, and thought content was  a little coherent and circumstantial. Pt's memory and concentration were fair.  Insight, judgment, and impulse control were poor.     Diagnosis: Schizoaffective Disorder, Bipolar I type; Polysubstance Use    Past Medical History:  Past Medical History:  Diagnosis Date  . Bipolar 1 disorder ([place])   . Dental abscess   . Depression   . Schizo affective schizophrenia ([place])     No past surgical history on file.  Family History:  Family History  Problem Relation Age of Onset  . Stroke Other     Social History:  reports that he has been smoking cigarettes. He has been smoking about 1.00 pack per day. He has never used smokeless tobacco. He reports current alcohol use. He reports current drug use. Frequency: 7.00 times per week. Drugs: "Crack" cocaine, Heroin, MDMA (Ecstacy), and Marijuana.  Additional Social History:  Alcohol / Drug Use Pain Medications: see MAR Prescriptions: see MAR Over the Counter: see MAR  CIWA:   COWS:    Allergies:  Allergies  Allergen Reactions  . Codeine Other (See Comments)    Patient states parents told him he was allergic at a young age.  Home Medications: (Not in a hospital admission)   OB/GYN Status:  No LMP for male patient.  General Assessment Data Location of Assessment: [facility] Services TTS Assessment: In system Is this a Tele or Face-to-Face Assessment?: Face-to-Face Is this an Initial Assessment or a Re-assessment for this encounter?: Initial Assessment Patient Accompanied by:: N/A Language Other than English: No Living Arrangements: Other (Comment) What gender do you  identify as?: Male Marital status: Long term relationship             Disposition: Mark Hammond, Dixon, FNP recommends overnight observation, reassess in the morning.     On Site Evaluation by:  Mark Hammond, Mark Hammond Reviewed with Physician:  Mark Liner, FNP  Mark Hammond 11/15/2019 6:32 AM

## 2019-11-15 NOTE — H&P (Signed)
Psychiatric Admission Assessment Adult  Patient Identification: Mark Hammond MRN:  [id number] Date of Evaluation:  11/15/2019 Chief Complaint:  Schizophrenia ([place]) [F20.9] Principal Diagnosis: Schizoaffective disorder, bipolar type ([place]) Diagnosis:  Principal Problem:   Schizoaffective disorder, bipolar type ([place]) Active Problems:   Schizophrenia ([place])  History of Present Illness:   This is the latest of multiple admissions/healthcare system encounters for Mark Hammond, he is 29 and has a chronic substance abuse history which has induced a chronic psychotic disorder.  He presented on 2/7 under petition but was released on 2/8, only to present again on 2/12 with a cluster of psychotic symptoms and self-harm/multiple self-inflicted wounds on his arms.  He states it is "religious" reports recent auditory hallucinations, reports seeing "crosses pentagrams and the #6 over and over"  When asked what drugs the patient is abusing he states "everything but heroin" and on the 2/7 presentation drug screen reflects abuse of methamphetamines, cocaine, and cannabis.  Blood alcohol level on 2/7 was 14-  Patient continues to express delusional believes states he cut himself because he is "religious".  According to the assessment note of earlier this a.m.:  Mark Hammond is an 29 y.o. male. Pt presents to [facility] as a walk in voluntarily unaccompanied for suicidal thoughts and hallucinations. Pt states he is having repetitive SI thoughts and HI thoughts. Pt states that he thought about overdosing today and has had SI attempts in the past. Pt states he constantly thinks about religion, and has hallucinations of seeing the number "666" as well as crosses. 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 Pt states that he hears voices as well and this is constant and ongoing for him. Pt states that AVH are not commanding him to do anything. Pt was last assessed and admitted to [facility] on  11/10/19 for SI and hallucinations. Pt has a history of presenting to the hospital multiple times for similar presentation, this will be his sixth assessment in 2021. Pt currently reports he last used drugs around 9pm, states he used crack and drank a few beers. During assessment pt presented with strong body odor of alcohol. Per pt chart history, pt has abused other drugs such as cocaine, meth and marijuana. Pt currently states that he also engaged in self injurious behaviors 2 days ago, by cutting himself. Pt presents with visible superficial cuts on his left forearm. Pt endorsed the following symptoms of depression such as hopelessness, worthlessness, anxiety and irritability. Pt reports getting 7 to 9 hours a sleep night but a poor appetite. Pt currently reports he is homeless and lives with his girlfriend on and off over the last few years. 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 Pt reports his current stressor as having relationship issues with his girlfriend no other stressors. Pt denies any experience of trauma/abuse. Pt reports he is taking Prozac and Seroquel but last took these medications 1 week ago. Pt reports he does not have a provider and was prescribed medications from last hospital admission.   During assessment, Pt presented as quiet but awake, he had body odor of alcohol. He had fair eye contact. Demeanor was hyperactive/restless. Pt was dressed in street clothes, and he appeared disheveled. Pt's mood and affect were preoccupied. Pt's speech was rapid, but otherwise normal in rhythm and volume. Thought processes were rapid, and thought content was  a little coherent and circumstantial. Pt's memory and concentration were fair. Insight, judgment, and impulse control  were poor.    Diagnosis: Schizoaffective Disorder, Bipolar I type; Polysubstance Use    Associated Signs/Symptoms: Depression Symptoms:  difficulty concentrating, (Hypo) Manic Symptoms:   Delusions, Distractibility, Anxiety Symptoms:  Excessive Worry, Psychotic Symptoms:  Delusions, Hallucinations: Auditory Visual PTSD Symptoms: NA Total Time spent with patient: 45 minutes  Past Psychiatric History: Patient has an extensive substance abuse history leading to chronic self-harm/overdose/and now he has progressed to a more permanent type of psychosis as it appears-his HPI from January 25 of this year reads as follows History of Present Illness:  From MD's admission SRA: 28, single, homeless, currently unemployed . Reports upcoming court date later this month. Patient had presented to ED on 1/21 reporting worsening substance abuse. At the time was medically cleared and discharged. Returned to ED via EMS after being found unresponsive by bystanders, with agonal breathing and pinpoint pupils, requiring Narcan. Patient states he has fragmented memories of event and states " last thing I remember was hitting the drug". Is the patient at risk to self? Yes.    Has the patient been a risk to self in the past 6 months? Yes.    Has the patient been a risk to self within the distant past? Yes.    Is the patient a risk to others? No.  Has the patient been a risk to others in the past 6 months? No.  Has the patient been a risk to others within the distant past? No.   Prior Inpatient Therapy: Prior Inpatient Therapy: Yes Prior Therapy Dates: 03/2019, multiple admits Prior Therapy Facilty/Provider(s): [facility] and other facilities Reason for Treatment: schizophrenia and SA Prior Outpatient Therapy: Prior Outpatient Therapy: No Does patient have an ACCT team?: No Does patient have Intensive In-House Services?  : No Does patient have [facility] services? : No Does patient have P4CC services?: No  Alcohol Screening:   Substance Abuse History in the last 12 months:  Yes.   Consequences of Substance Abuse: Medical Consequences:  Induction of psychosis that I believe is now  permanent Blackouts:  Little recall of his impairment Previous Psychotropic Medications: Yes  Psychological Evaluations: No  Past Medical History:  Past Medical History:  Diagnosis Date  . Bipolar 1 disorder ([facility])   . Dental abscess   . Depression   . Schizo affective schizophrenia ([facility])    History reviewed. No pertinent surgical history. Family History:  Family History  Problem Relation Age of Onset  . Stroke Other    Family Psychiatric  History: Patient did not elaborate but chart indicates a possible history of depression in the family Tobacco Screening:   Social History:  Social History   Substance and Sexual Activity  Alcohol Use Yes   Comment: Drinks 3-4 times a week. Last drink: this AM     Social History   Substance and Sexual Activity  Drug Use Yes  . Frequency: 7.0 times per week  . Types: "Crack" cocaine, Heroin, MDMA (Ecstacy), Marijuana   Comment: UDS not available at time of assessment    Additional Social History: Marital status: Long term relationship    Pain Medications: see MAR Prescriptions: see MAR Over the Counter: see MAR                    Allergies:   Allergies  Allergen Reactions  . Codeine Other (See Comments)    Patient states parents told him he was allergic at a young age.   Lab Results:  Results for orders placed or  performed during the hospital encounter of 11/15/19 (from the past 48 hour(s))  Respiratory Panel by RT PCR (Flu A&B, Covid) - Nasopharyngeal Swab     Status: None   Collection Time: 11/15/19  6:26 AM   Specimen: Nasopharyngeal Swab  Result Value Ref Range   SARS Coronavirus 2 by RT PCR NEGATIVE NEGATIVE    Comment: (NOTE) SARS-CoV-2 target nucleic acids are NOT DETECTED. The SARS-CoV-2 RNA is generally detectable in upper respiratoy specimens during the acute phase of infection. The lowest concentration of SARS-CoV-2 viral copies this assay can detect is 131 copies/mL. A negative result does not preclude  SARS-Cov-2 infection and should not be used as the sole basis for treatment or other patient management decisions. A negative result may occur with  improper specimen collection/handling, submission of specimen other than nasopharyngeal swab, presence of viral mutation(s) within the areas targeted by this assay, and inadequate number of viral copies (<131 copies/mL). A negative result must be combined with clinical observations, patient history, and epidemiological information. The expected result is Negative. Fact Sheet for Patients:  https://www.moore.com/ Fact Sheet for Healthcare Providers:  https://www.young.biz/ This test is not yet ap proved or cleared by the [place] FDA and  has been authorized for detection and/or diagnosis of SARS-CoV-2 by FDA under an Emergency Use Authorization (EUA). This EUA will remain  in effect (meaning this test can be used) for the duration of the COVID-19 declaration under Section 564(b)(1) of the Act, 21 U.S.C. section 360bbb-3(b)(1), unless the authorization is terminated or revoked sooner.    Influenza A by PCR NEGATIVE NEGATIVE   Influenza B by PCR NEGATIVE NEGATIVE    Comment: (NOTE) The Xpert Xpress SARS-CoV-2/FLU/RSV assay is intended as an aid in  the diagnosis of influenza from Nasopharyngeal swab specimens and  should not be used as a sole basis for treatment. Nasal washings and  aspirates are unacceptable for Xpert Xpress SARS-CoV-2/FLU/RSV  testing. Fact Sheet for Patients: https://www.moore.com/ Fact Sheet for Healthcare Providers: https://www.young.biz/ This test is not yet approved or cleared by the [place] FDA and  has been authorized for detection and/or diagnosis of SARS-CoV-2 by  FDA under an Emergency Use Authorization (EUA). This EUA will remain  in effect (meaning this test can be used) for the duration of the  Covid-19 declaration  under Section 564(b)(1) of the Act, 21  U.S.C. section 360bbb-3(b)(1), unless the authorization is  terminated or revoked. Performed at [facility], [address] [place], [place] [address]     Blood Alcohol level:  Lab Results  Component Value Date   ETH 14 (H) 11/10/2019   ETH <10 11/07/2019    Metabolic Disorder Labs:  Lab Results  Component Value Date   HGBA1C 5.1 10/29/2019   MPG 99.67 10/29/2019   MPG 99.67 03/07/2019   No results found for: PROLACTIN Lab Results  Component Value Date   CHOL 134 10/29/2019   TRIG 107 10/29/2019   HDL 60 10/29/2019   CHOLHDL 2.2 10/29/2019   VLDL 21 10/29/2019   LDLCALC 53 10/29/2019   LDLCALC 41 03/07/2019    Current Medications: Current Facility-Administered Medications  Medication Dose Route Frequency Provider Last Rate Last Admin  . acetaminophen (TYLENOL) tablet 650 mg  650 mg Oral Q6H PRN Dixon, [name] M, NP      . alum & mag hydroxide-simeth (MAALOX/MYLANTA) 200-200-20 MG/5ML suspension 30 mL  30 mL Oral Q4H PRN Dixon, Elray Buba, NP      . benztropine (COGENTIN) tablet  0.5 mg  0.5 mg Oral BID Malvin Johns, MD      . gabapentin (NEURONTIN) capsule 300 mg  300 mg Oral TID Malvin Johns, MD   300 mg at 11/15/19 1315  . hydrOXYzine (ATARAX/VISTARIL) tablet 25 mg  25 mg Oral TID PRN Jearld Lesch, NP      . magnesium hydroxide (MILK OF MAGNESIA) suspension 30 mL  30 mL Oral Daily PRN Durwin Nora, [name] M, NP      . risperiDONE (RISPERDAL) tablet 2 mg  2 mg Oral BID Malvin Johns, MD       PTA Medications: Medications Prior to Admission  Medication Sig Dispense Refill Last Dose  . acetaminophen (TYLENOL) 325 MG tablet Take 650 mg by mouth every 6 (six) hours as needed for mild pain or headache.     Marland Kitchen FLUoxetine (PROZAC) 10 MG capsule Take 1 capsule (10 mg total) by mouth daily. (Patient not taking: Reported on 11/15/2019) 30 capsule 0 Not Taking at Unknown time  . Multiple Vitamin (MULTIVITAMIN WITH MINERALS)  TABS tablet Take 1 tablet by mouth daily. (Patient not taking: Reported on 11/07/2019)     . QUEtiapine (SEROQUEL) 200 MG tablet Take 1 tablet (200 mg total) by mouth at bedtime. (Patient not taking: Reported on 11/15/2019) 30 tablet 0 Not Taking at Unknown time    Musculoskeletal: Strength & Muscle Tone: within normal limits Gait & Station: normal Patient leans: N/A  Psychiatric Specialty Exam: Physical Exam  Nursing note and vitals reviewed. Constitutional: He appears well-developed and well-nourished.  Cardiovascular: Normal rate and regular rhythm.   multiple superficial cuts on arms/forearms  Review of Systems  Constitutional: Negative.   Eyes: Negative.   Respiratory: Negative.   Cardiovascular: Negative.   Endocrine: Negative.   Genitourinary: Negative.   Neurological: Negative.     Blood pressure 122/79, pulse 93, temperature 98.2 F (36.8 C), temperature source Oral, resp. rate 16, SpO2 97 %.There is no height or weight on file to calculate BMI.  General Appearance: Casual and Disheveled  Eye Contact:  Poor  Speech:  Normal Rate  Volume:  Decreased  Mood:  Dysphoric  Affect:  Constricted  Thought Process:  Irrelevant and Descriptions of Associations: Circumstantial  Orientation:  Full (Time, Place, and Person)  Thought Content:  Illogical, Delusions and Hallucinations: Auditory Visual  Suicidal Thoughts:  Yes.  without intent/plan based on recent actions but denies current thoughts plans or intent now and can contract for safety and despite psychosis understands what it means to contract for safety  Homicidal Thoughts:  No  Memory:  Immediate;   Poor Recent;   Fair Remote;   Fair  Judgement:  Fair  Insight:  Shallow  Psychomotor Activity:  Normal  Concentration:  Concentration: Good and Attention Span: Fair  Recall:  Good but clearly when impaired has blackouts  Fund of Knowledge:  Fair  Language:  Fair  Akathisia:  Negative  Handed:  Right  AIMS (if  indicated):     Assets:  Physical Health Resilience  ADL's: Not currently impaired to the degree of compromise  Cognition:  WNL  Sleep:       Treatment Plan Summary: Daily contact with patient to assess and evaluate symptoms and progress in treatment and Medication management  Observation Level/Precautions:  15 minute checks  Laboratory:  UDS  Psychotherapy: Reality based rehab based  Medications: Begin antipsychotic medication  Consultations:    Discharge Concerns: Longer term sobriety clearly needs rehab/concern over the induction of a permanent psychotic  disorder due to substance use  Estimated LOS: 7-10  Other:   Schizoaffective, bipolar type with psychosis and affect of instability probably secondary to chronic substance use involving amphetamines/cannabis chronically and cocaine.  Patient states he will generally take any drug that is available to him.    Physician Treatment Plan for Primary Diagnosis: Schizoaffective disorder, bipolar type (HCC) Long Term Goal(s): Improvement in symptoms so as ready for discharge  Short Term Goals: Ability to verbalize feelings will improve, Ability to disclose and discuss suicidal ideas, Ability to demonstrate self-control will improve, Ability to identify and develop effective coping behaviors will improve and Ability to maintain clinical measurements within normal limits will improve  Physician Treatment Plan for Secondary Diagnosis: Principal Problem:   Schizoaffective disorder, bipolar type (HCC) Active Problems:   Schizophrenia (HCC)  Long Term Goal(s): Improvement in symptoms so as ready for discharge  Short Term Goals: Ability to disclose and discuss suicidal ideas, Ability to demonstrate self-control will improve, Ability to identify and develop effective coping behaviors will improve, Ability to maintain clinical measurements within normal limits will improve and Compliance with prescribed medications will improve  I certify that  inpatient services furnished can reasonably be expected to improve the patient's condition.    Malvin Johns, MD 2/12/20213:38 PM

## 2019-11-16 DIAGNOSIS — F250 Schizoaffective disorder, bipolar type: Principal | ICD-10-CM

## 2019-11-16 LAB — CBC
HCT: 48.6 % (ref 39.0–52.0)
Hemoglobin: 16.3 g/dL (ref 13.0–17.0)
MCH: 32 pg (ref 26.0–34.0)
MCHC: 33.5 g/dL (ref 30.0–36.0)
MCV: 95.5 fL (ref 80.0–100.0)
Platelets: 260 10*3/uL (ref 150–400)
RBC: 5.09 MIL/uL (ref 4.22–5.81)
RDW: 13.1 % (ref 11.5–15.5)
WBC: 5 10*3/uL (ref 4.0–10.5)
nRBC: 0 % (ref 0.0–0.2)

## 2019-11-16 LAB — HEMOGLOBIN A1C
Hgb A1c MFr Bld: 5.3 % (ref 4.8–5.6)
Mean Plasma Glucose: 105.41 mg/dL

## 2019-11-16 LAB — LIPID PANEL
Cholesterol: 144 mg/dL (ref 0–200)
HDL: 67 mg/dL (ref >40)
LDL Cholesterol: 62 mg/dL (ref 0–99)
Total CHOL/HDL Ratio: 2.1 RATIO
Triglycerides: 74 mg/dL (ref <150)
VLDL: 15 mg/dL (ref 0–40)

## 2019-11-16 LAB — MAGNESIUM: Magnesium: 2 mg/dL (ref 1.7–2.4)

## 2019-11-16 LAB — HEPATIC FUNCTION PANEL
ALT: 43 U/L (ref 0–44)
AST: 40 U/L (ref 15–41)
Albumin: 3.4 g/dL — ABNORMAL LOW (ref 3.5–5.0)
Alkaline Phosphatase: 85 U/L (ref 38–126)
Bilirubin, Direct: 0.1 mg/dL (ref 0.0–0.2)
Indirect Bilirubin: 0.8 mg/dL (ref 0.3–0.9)
Total Bilirubin: 0.9 mg/dL (ref 0.3–1.2)
Total Protein: 6.7 g/dL (ref 6.5–8.1)

## 2019-11-16 LAB — COMPREHENSIVE METABOLIC PANEL
ALT: 43 U/L (ref 0–44)
AST: 39 U/L (ref 15–41)
Albumin: 3.5 g/dL (ref 3.5–5.0)
Alkaline Phosphatase: 86 U/L (ref 38–126)
Anion gap: 8 (ref 5–15)
BUN: 7 mg/dL (ref 6–20)
CO2: 27 mmol/L (ref 22–32)
Calcium: 8.8 mg/dL — ABNORMAL LOW (ref 8.9–10.3)
Chloride: 104 mmol/L (ref 98–111)
Creatinine, Ser: 0.67 mg/dL (ref 0.61–1.24)
GFR calc Af Amer: >60 mL/min (ref >60)
GFR calc non Af Amer: >60 mL/min (ref >60)
Glucose, Bld: 106 mg/dL — ABNORMAL HIGH (ref 70–99)
Potassium: 3.9 mmol/L (ref 3.5–5.1)
Sodium: 139 mmol/L (ref 135–145)
Total Bilirubin: 0.9 mg/dL (ref 0.3–1.2)
Total Protein: 6.7 g/dL (ref 6.5–8.1)

## 2019-11-16 LAB — ETHANOL: Alcohol, Ethyl (B): <10 mg/dL (ref <10)

## 2019-11-16 LAB — TSH: TSH: 0.595 u[IU]/mL (ref 0.350–4.500)

## 2019-11-16 NOTE — [facility] Suicide Risk Assessment (Signed)
[facility] INPATIENT:  Family/Significant Other Suicide Prevention Education  Suicide Prevention Education:  Patient Refusal for Family/Significant Other Suicide Prevention Education: The patient Mark Hammond has refused to provide written consent for family/significant other to be provided Family/Significant Other Suicide Prevention Education during admission and/or prior to discharge.  Physician notified.  When asked for permission to talk to someone in his life about safety information, patient stated, "Ah hell no" and became quite agitated.  [name] 11/16/2019, 4:33 PM

## 2019-11-16 NOTE — Progress Notes (Signed)
   11/16/19 0605  Psych Admission Type (Psych Patients Only)  Admission Status Voluntary  Psychosocial Assessment  Patient Complaints Anxiety;Depression  Eye Contact Fair  Facial Expression Flat  Affect Preoccupied  Speech Logical/coherent  Interaction Assertive  Motor Activity Slow  Appearance/Hygiene Unremarkable;In hospital gown  Behavior Characteristics Cooperative  Mood Depressed;Preoccupied  Aggressive Behavior  Effect Self-harm (nothing new)  Thought Process  Coherency WDL  Content WDL  Delusions Religious (sees crosses in tne floor and 6s everywhere)  Perception Hallucinations  Hallucination Visual  Judgment Impaired  Confusion None  Danger to Self  Current suicidal ideation? Denies  Self-Injurious Behavior Some self-injurious ideation observed or expressed.  No lethal plan expressed   Danger to Others  Danger to Others None reported or observed   Pt awake and in the dayroom this morning. Sleep the entire shift. Pt denies SI, HI. States he has pain in his lower back. Pt endorses VH of a religious nature, saying that he sees crosses in the patterns of the floor and 6s everywhere. Pt has various cuts in his right forearm from four days ago.

## 2019-11-16 NOTE — [facility] Group Notes (Signed)
Adult Psychoeducational Group Note  Date:  11/16/2019 Time:  12:08 PM  Group Topic/Focus:  Goals Group:   The focus of this group is to help patients establish daily goals to achieve during treatment and discuss how the patient can incorporate goal setting into their daily lives to aide in recovery.  Participation Level:  Did Not Attend   Dione Housekeeper 11/16/2019, 12:08 PM

## 2019-11-16 NOTE — Progress Notes (Signed)
[facility] MD Progress Note  11/16/2019 9:42 AM Mark Hammond  MRN:  [id number] Subjective: Patient is a 29 year old male with a past psychiatric history significant for schizoaffective disorder who was admitted on 11/15/2019 secondary to psychosis, auditory hallucinations, and a history of use disorders including methamphetamines, cocaine and cannabis.  Objective: Patient is seen and examined.  Patient is a 29 year old male with the above-stated past psychiatric history who is seen in follow-up.  Patient was readmitted to the hospital secondary to continued substance abuse and noncompliance with his medications.  This morning he stated that the reason why he gets so angry and irritable is because a "the sixes everywhere, and the pentagrams".  We discussed the methamphetamines and other drugs that may be worsening his psychotic symptoms, and he stated "they do not start showing up until I am, and off of the drugs".  He also admitted that he has a court date on the 18th.  He stated that his biggest problem is that he gets angry so easily.  Review of his admission laboratories revealed his drug screen positive for amphetamines, cocaine as well as marijuana.  His EKG showed a sinus tachycardia with a normal QTc interval.  TSH was low normal at 0.595.  Blood alcohol and salicylate as well as acetaminophen were all negative.  His lipid panel, CBC were essentially normal.  His chemistries were all essentially normal.  He did have an elevated CK at 412.  His troponin had previously been less than 2 on 2/4.  He also had a D-dimer on 2/4 that was 0.56.  Because of these abnormalities he did have a CT angio of the chest done.  It was negative for pulmonary emboli.  There is also evidence of bronchitis versus bronchiolitis.  His vital signs are stable, he is afebrile.  He slept 8 hours last night.  His current medications include Cogentin, gabapentin, hydroxyzine, Risperdal and trazodone.  He denied suicidal or homicidal  ideation.  Principal Problem: Schizoaffective disorder, bipolar type (HCC) Diagnosis: Principal Problem:   Schizoaffective disorder, bipolar type (HCC) Active Problems:   Schizophrenia (HCC)  Total Time spent with patient: 20 minutes  Past Psychiatric History: See admission H&P  Past Medical History:  Past Medical History:  Diagnosis Date  . Bipolar 1 disorder (HCC)   . Dental abscess   . Depression   . Schizo affective schizophrenia (HCC)    History reviewed. No pertinent surgical history. Family History:  Family History  Problem Relation Age of Onset  . Stroke Other    Family Psychiatric  History: See admission H&P Social History:  Social History   Substance and Sexual Activity  Alcohol Use Yes   Comment: Drinks 3-4 times a week. Last drink: this AM     Social History   Substance and Sexual Activity  Drug Use Yes  . Frequency: 7.0 times per week  . Types: "Crack" cocaine, Heroin, MDMA (Ecstacy), Marijuana   Comment: UDS not available at time of assessment    Social History   Socioeconomic History  . Marital status: Significant Other    Spouse name: Not on file  . Number of children: Not on file  . Years of education: Not on file  . Highest education level: Not on file  Occupational History  . Not on file  Tobacco Use  . Smoking status: Current Every Day Smoker    Packs/day: 1.00    Types: Cigarettes  . Smokeless tobacco: Never Used  Substance and Sexual Activity  . Alcohol  use: Yes    Comment: Drinks 3-4 times a week. Last drink: this AM  . Drug use: Yes    Frequency: 7.0 times per week    Types: "Crack" cocaine, Heroin, MDMA (Ecstacy), Marijuana    Comment: UDS not available at time of assessment  . Sexual activity: Yes  Other Topics Concern  . Not on file  Social History Narrative   Pt lives in [place] with ex-girlfriend.  Pt is not followed by an outpatient psychiatric provider.   Social Determinants of Health   Financial Resource Strain:    . Difficulty of Paying Living Expenses: Not on file  Food Insecurity:   . Worried About Programme researcher, broadcasting/film/video in the Last Year: Not on file  . Ran Out of Food in the Last Year: Not on file  Transportation Needs:   . Lack of Transportation (Medical): Not on file  . Lack of Transportation (Non-Medical): Not on file  Physical Activity:   . Days of Exercise per Week: Not on file  . Minutes of Exercise per Session: Not on file  Stress:   . Feeling of Stress : Not on file  Social Connections:   . Frequency of Communication with Friends and Family: Not on file  . Frequency of Social Gatherings with Friends and Family: Not on file  . Attends Religious Services: Not on file  . Active Member of Clubs or Organizations: Not on file  . Attends Banker Meetings: Not on file  . Marital Status: Not on file   Additional Social History:    Pain Medications: see MAR Prescriptions: see MAR Over the Counter: see MAR                    Sleep: Good  Appetite:  Good  Current Medications: Current Facility-Administered Medications  Medication Dose Route Frequency Provider Last Rate Last Admin  . acetaminophen (TYLENOL) tablet 650 mg  650 mg Oral Q6H PRN Dixon, [name] M, NP      . alum & mag hydroxide-simeth (MAALOX/MYLANTA) 200-200-20 MG/5ML suspension 30 mL  30 mL Oral Q4H PRN Dixon, [name] M, NP      . benztropine (COGENTIN) tablet 0.5 mg  0.5 mg Oral BID Malvin Johns, MD   0.5 mg at 11/16/19 0831  . gabapentin (NEURONTIN) capsule 300 mg  300 mg Oral TID Malvin Johns, MD   300 mg at 11/16/19 9735  . hydrOXYzine (ATARAX/VISTARIL) tablet 25 mg  25 mg Oral TID PRN Jearld Lesch, NP      . magnesium hydroxide (MILK OF MAGNESIA) suspension 30 mL  30 mL Oral Daily PRN Jearld Lesch, NP      . risperiDONE (RISPERDAL) tablet 3 mg  3 mg Oral BID Malvin Johns, MD   3 mg at 11/16/19 0831  . traZODone (DESYREL) tablet 200 mg  200 mg Oral QHS PRN Malvin Johns, MD        Lab  Results:  Results for orders placed or performed during the hospital encounter of 11/15/19 (from the past 48 hour(s))  Respiratory Panel by RT PCR (Flu A&B, Covid) - Nasopharyngeal Swab     Status: None   Collection Time: 11/15/19  6:26 AM   Specimen: Nasopharyngeal Swab  Result Value Ref Range   SARS Coronavirus 2 by RT PCR NEGATIVE NEGATIVE    Comment: (NOTE) SARS-CoV-2 target nucleic acids are NOT DETECTED. The SARS-CoV-2 RNA is generally detectable in upper respiratoy specimens during the acute phase of infection.  The lowest concentration of SARS-CoV-2 viral copies this assay can detect is 131 copies/mL. A negative result does not preclude SARS-Cov-2 infection and should not be used as the sole basis for treatment or other patient management decisions. A negative result may occur with  improper specimen collection/handling, submission of specimen other than nasopharyngeal swab, presence of viral mutation(s) within the areas targeted by this assay, and inadequate number of viral copies (<131 copies/mL). A negative result must be combined with clinical observations, patient history, and epidemiological information. The expected result is Negative. Fact Sheet for Patients:  https://www.moore.com/ Fact Sheet for Healthcare Providers:  https://www.young.biz/ This test is not yet ap proved or cleared by the [place] FDA and  has been authorized for detection and/or diagnosis of SARS-CoV-2 by FDA under an Emergency Use Authorization (EUA). This EUA will remain  in effect (meaning this test can be used) for the duration of the COVID-19 declaration under Section 564(b)(1) of the Act, 21 U.S.C. section 360bbb-3(b)(1), unless the authorization is terminated or revoked sooner.    Influenza A by PCR NEGATIVE NEGATIVE   Influenza B by PCR NEGATIVE NEGATIVE    Comment: (NOTE) The Xpert Xpress SARS-CoV-2/FLU/RSV assay is intended as an aid in  the  diagnosis of influenza from Nasopharyngeal swab specimens and  should not be used as a sole basis for treatment. Nasal washings and  aspirates are unacceptable for Xpert Xpress SARS-CoV-2/FLU/RSV  testing. Fact Sheet for Patients: https://www.moore.com/ Fact Sheet for Healthcare Providers: https://www.young.biz/ This test is not yet approved or cleared by the [place] FDA and  has been authorized for detection and/or diagnosis of SARS-CoV-2 by  FDA under an Emergency Use Authorization (EUA). This EUA will remain  in effect (meaning this test can be used) for the duration of the  Covid-19 declaration under Section 564(b)(1) of the Act, 21  U.S.C. section 360bbb-3(b)(1), unless the authorization is  terminated or revoked. Performed at [facility], [address] [place], [place] [address]   CBC     Status: None   Collection Time: 11/16/19  6:28 AM  Result Value Ref Range   WBC 5.0 4.0 - 10.5 K/uL   RBC 5.09 4.22 - 5.81 MIL/uL   Hemoglobin 16.3 13.0 - 17.0 g/dL   HCT 16.0 73.7 - 10.6 %   MCV 95.5 80.0 - 100.0 fL   MCH 32.0 26.0 - 34.0 pg   MCHC 33.5 30.0 - 36.0 g/dL   RDW 26.9 48.5 - 46.2 %   Platelets 260 150 - 400 K/uL   nRBC 0.0 0.0 - 0.2 %    Comment: Performed at [facility], [address], [place], [place] [address]  Comprehensive metabolic panel     Status: Abnormal   Collection Time: 11/16/19  6:28 AM  Result Value Ref Range   Sodium 139 135 - 145 mmol/L   Potassium 3.9 3.5 - 5.1 mmol/L   Chloride 104 98 - 111 mmol/L   CO2 27 22 - 32 mmol/L   Glucose, Bld 106 (H) 70 - 99 mg/dL   BUN 7 6 - 20 mg/dL   Creatinine, Ser 0.93 0.61 - 1.24 mg/dL   Calcium 8.8 (L) 8.9 - 10.3 mg/dL   Total Protein 6.7 6.5 - 8.1 g/dL   Albumin 3.5 3.5 - 5.0 g/dL   AST 39 15 - 41 U/L   ALT 43 0 - 44 U/L   Alkaline Phosphatase 86 38 - 126 U/L   Total Bilirubin 0.9 0.3 -  1.2 mg/dL   GFR calc non Af Amer >60  >60 mL/min   GFR calc Af Amer >60 >60 mL/min   Anion gap 8 5 - 15    Comment: Performed at [facility], [address], [place], [place] [address]  Magnesium     Status: None   Collection Time: 11/16/19  6:28 AM  Result Value Ref Range   Magnesium 2.0 1.7 - 2.4 mg/dL    Comment: Performed at [facility], [address] [place], [place] [address]  Ethanol     Status: None   Collection Time: 11/16/19  6:28 AM  Result Value Ref Range   Alcohol, Ethyl (B) <10 <10 mg/dL    Comment: (NOTE) Lowest detectable limit for serum alcohol is 10 mg/dL. For medical purposes only. Performed at [facility], [address], [place], [place] [address]   Lipid panel     Status: None   Collection Time: 11/16/19  6:28 AM  Result Value Ref Range   Cholesterol 144 0 - 200 mg/dL   Triglycerides 74 <286 mg/dL   HDL 67 >38 mg/dL   Total CHOL/HDL Ratio 2.1 RATIO   VLDL 15 0 - 40 mg/dL   LDL Cholesterol 62 0 - 99 mg/dL    Comment:        Total Cholesterol/HDL:CHD Risk Coronary Heart Disease Risk Table                     Men   Women  1/2 Average Risk   3.4   3.3  Average Risk       5.0   4.4  2 X Average Risk   9.6   7.1  3 X Average Risk  23.4   11.0        Use the calculated Patient Ratio above and the CHD Risk Table to determine the patient's CHD Risk.        ATP III CLASSIFICATION (LDL):  <100     mg/dL   Optimal  177-116  mg/dL   Near or Above                    Optimal  130-159  mg/dL   Borderline  579-038  mg/dL   High  >333     mg/dL   Very High Performed at [facility], [address], [place], [place] [address]   Hepatic function panel     Status: Abnormal   Collection Time: 11/16/19  6:28 AM  Result Value Ref Range   Total Protein 6.7 6.5 - 8.1 g/dL   Albumin 3.4 (L) 3.5 - 5.0 g/dL   AST 40 15 - 41 U/L   ALT 43 0 - 44 U/L   Alkaline Phosphatase 85 38 - 126 U/L   Total Bilirubin 0.9 0.3 - 1.2 mg/dL    Bilirubin, Direct 0.1 0.0 - 0.2 mg/dL   Indirect Bilirubin 0.8 0.3 - 0.9 mg/dL    Comment: Performed at [facility], [address] [place], [place] [address]  TSH     Status: None   Collection Time: 11/16/19  6:28 AM  Result Value Ref Range   TSH 0.595 0.350 - 4.500 uIU/mL    Comment: Performed by a 3rd Generation assay with a functional sensitivity of <=0.01 uIU/mL. Performed at [facility], [address] [place], [place] [address]     Blood Alcohol level:  Lab Results  Component Value Date   ETH <10 11/16/2019   ETH 14 (H) 53/29/9242    Metabolic Disorder Labs: Lab Results  Component Value Date   HGBA1C 5.1 10/29/2019   MPG 99.67 10/29/2019   MPG 99.67 03/07/2019   No results found for: PROLACTIN Lab Results  Component Value Date   CHOL 144 11/16/2019   TRIG 74 11/16/2019   HDL 67 11/16/2019   CHOLHDL 2.1 11/16/2019   VLDL 15 11/16/2019   LDLCALC 62 11/16/2019   LDLCALC 53 10/29/2019    Physical Findings: AIMS:  , ,  ,  ,    CIWA:    COWS:     Musculoskeletal: Strength & Muscle Tone: within normal limits Gait & Station: normal Patient leans: N/A  Psychiatric Specialty Exam: Physical Exam  Nursing note and vitals reviewed. Constitutional: He is oriented to person, place, and time. He appears well-developed and well-nourished.  HENT:  Head: Normocephalic and atraumatic.  Respiratory: Effort normal.  Neurological: He is alert and oriented to person, place, and time.    Review of Systems  Blood pressure 112/85, pulse 93, temperature 97.8 F (36.6 C), temperature source Oral, resp. rate 16, SpO2 97 %.There is no height or weight on file to calculate BMI.  General Appearance: Disheveled  Eye Contact:  Fair  Speech:  Normal Rate  Volume:  Normal  Mood:  Dysphoric and Irritable  Affect:  Congruent  Thought Process:  Coherent and Descriptions of Associations: Loose  Orientation:  Full (Time, Place, and Person)   Thought Content:  Delusions, Hallucinations: Visual and Paranoid Ideation  Suicidal Thoughts:  No  Homicidal Thoughts:  No  Memory:  Immediate;   Fair Recent;   Fair Remote;   Fair  Judgement:  Impaired  Insight:  Lacking  Psychomotor Activity:  Increased  Concentration:  Concentration: Fair and Attention Span: Fair  Recall:  [employer] of Knowledge:  Fair  Language:  Good  Akathisia:  Negative  Handed:  Right  AIMS (if indicated):     Assets:  Desire for Improvement Resilience  ADL's:  Intact  Cognition:  WNL  Sleep:  Number of Hours: 8     Treatment Plan Summary: Daily contact with patient to assess and evaluate symptoms and progress in treatment, Medication management and Plan : Patient is seen and examined.  Patient is a 29 year old male with the above-stated past psychiatric history who is seen in follow-up.   Diagnosis: #1 schizoaffective disorder; bipolar type versus substance-induced psychotic disorder, #2 methamphetamine dependence, #3 cocaine dependence, #4 marijuana dependence  Patient is seen in follow-up.  He looks a little bit better than what was described on admission.  He continues to have psychotic delusions, and I am assuming visual hallucinations because of his descriptions of the number 6's and pentagrams that he continues to report he sees.  He remains on Risperdal 3 mg p.o. twice daily.  I am not going to change that.  He does describe episodes of significant irritability.  I will increase his gabapentin to 400 mg p.o. 3 times daily.  Hopefully that will decrease the risk of any agitation while he is on the unit.  He has had a major work-up in the past because of shortness of breath and chest pain, and most of that has been negative.  His other laboratories appear to be normal.  No other changes in his medications today.  1.  Continue Cogentin 0.5 mg p.o. twice daily for side effects of medication. 2.  Increase  gabapentin to 400 mg p.o. 3 times daily for  irritability, anxiety and chronic pain. 3.  Continue Risperdal 3 mg p.o. twice daily for psychosis. 4.  Continue trazodone 200 mg p.o. nightly as needed insomnia. 5.  Disposition planning-in progress.  Antonieta PertGreg Lawson Murlean Seelye, MD 11/16/2019, 9:42 AM

## 2019-11-16 NOTE — [facility] Group Notes (Signed)
[facility] Group Notes: (Clinical Social Work)   11/16/2019      Type of Therapy:  Group Therapy   Participation Level:  Did Not Attend - was invited both individually by MHT and by overhead announcement, chose not to attend.   Ambrose Mantle, LCSW 11/16/2019, 12:07 PM
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
## 2019-11-16 NOTE — Progress Notes (Signed)
   11/16/19 1300  Psych Admission Type (Psych Patients Only)  Admission Status Voluntary  Psychosocial Assessment  Patient Complaints None  Eye Contact Fair  Facial Expression Flat  Affect Preoccupied  Speech Logical/coherent  Interaction Assertive  Motor Activity Slow  Appearance/Hygiene Unremarkable  Behavior Characteristics Cooperative  Aggressive Behavior  Type of Behavior Verbal  Effect Self-harm (nothing new)  Thought Process  Coherency WDL  Content WDL  Delusions Religious (sees crosses in tne floor and 6s everywhere)  Perception Hallucinations  Hallucination Visual  Judgment Impaired  Confusion None  Danger to Self  Current suicidal ideation? Denies  Self-Injurious Behavior Some self-injurious ideation observed or expressed.  No lethal plan expressed   Agreement Not to Harm Self Yes  Description of Agreement Verbally contracts for safety  Danger to Others  Danger to Others None reported or observed

## 2019-11-17 LAB — PROLACTIN: Prolactin: 31.4 ng/mL — ABNORMAL HIGH (ref 4.0–15.2)

## 2019-11-17 NOTE — Progress Notes (Signed)
   11/17/19 2030  COVID-19 Daily Checkoff  Have you had a fever (temp > 37.80C/100F)  in the past 24 hours?  No  If you have had runny nose, nasal congestion, sneezing in the past 24 hours, has it worsened? No  COVID-19 EXPOSURE  Have you traveled outside the state in the past 14 days? No  Have you been in contact with someone with a confirmed diagnosis of COVID-19 or PUI in the past 14 days without wearing appropriate PPE? No  Have you been living in the same home as a person with confirmed diagnosis of COVID-19 or a PUI (household contact)? No  Have you been diagnosed with COVID-19? No

## 2019-11-17 NOTE — [facility] Group Notes (Signed)
Adult Psychoeducational Group Note  Date:  11/17/2019 Time:  11:31 AM  Group Topic/Focus:  Progressive Relaxation Teaching deep breathing and to tense and relax muscle groups,while using uided imagery,  Participation Level:  Did Not Attend   Mark Hammond 11/17/2019, 11:31 AM

## 2019-11-17 NOTE — Progress Notes (Signed)
   11/17/19 1400  Psych Admission Type (Psych Patients Only)  Admission Status Voluntary  Psychosocial Assessment  Patient Complaints None  Eye Contact Fair  Facial Expression Flat  Affect Preoccupied  Speech Logical/coherent  Interaction Assertive  Motor Activity Slow  Appearance/Hygiene Unremarkable  Behavior Characteristics Cooperative  Mood Sad;Pleasant  Aggressive Behavior  Targets Self;Other (Comment) (C/O SI & HI)  Type of Behavior Verbal  Effect Self-harm (nothing new)  Thought Process  Coherency WDL  Content WDL  Delusions Religious (sees crosses in tne floor and 6s everywhere)  Perception Hallucinations  Hallucination Visual  Judgment Impaired  Confusion None  Danger to Self  Current suicidal ideation? Denies  Self-Injurious Behavior Some self-injurious ideation observed or expressed.  No lethal plan expressed   Agreement Not to Harm Self Yes  Description of Agreement Verbally contracts for safety  Danger to Others  Danger to Others None reported or observed

## 2019-11-17 NOTE — [facility] Group Notes (Signed)
n

## 2019-11-17 NOTE — Progress Notes (Signed)
   11/17/19 2030  Psych Admission Type (Psych Patients Only)  Admission Status Voluntary  Psychosocial Assessment  Patient Complaints None  Eye Contact Fair  Facial Expression Flat  Affect Preoccupied  Speech Logical/coherent  Interaction Assertive  Motor Activity Slow  Appearance/Hygiene In hospital gown  Behavior Characteristics Cooperative  Mood Depressed;Pleasant  Aggressive Behavior  Targets Self;Other (Comment) (C/O SI & HI)  Type of Behavior Verbal  Effect Self-harm (nothing new)  Thought Process  Coherency WDL  Content WDL  Delusions None reported or observed (sees crosses in tne floor and 6s everywhere)  Perception WDL  Hallucination None reported or observed  Judgment Poor  Confusion None  Danger to Self  Current suicidal ideation? Denies  Self-Injurious Behavior Some self-injurious ideation observed or expressed.  No lethal plan expressed   Agreement Not to Harm Self Yes  Description of Agreement Verbally contracts for safety  Danger to Others  Danger to Others None reported or observed

## 2019-11-17 NOTE — Progress Notes (Addendum)
Adult Psychoeducational Group Note  Date:  11/17/2019 Time:  9:20 PM  Group Topic/Focus:  Wrap-Up Group:   The focus of this group is to help patients review their daily goal of treatment and discuss progress on daily workbooks.  Participation Level:  Active  Participation Quality:  Appropriate  Affect:  Appropriate  Cognitive:  Appropriate  Insight: Appropriate  Engagement in Group:  Engaged  Modes of Intervention:  Discussion  Additional Comments:  Patient attended group and said that his day was a 9.  His goal for today was to be discharged but that did not occurred. Patient states that he isn't SI nor HI and he will notified staff if he starts having those thoughts.                   Aryanna Shaver W Novalie Leamy 11/17/2019, 9:20 PM

## 2019-11-17 NOTE — Progress Notes (Signed)
[facility] MD Progress Note  11/17/2019 11:45 AM Mark Hammond  MRN:  [id number] Subjective:  Patient is a 29 year old male with a past psychiatric history significant for schizoaffective disorder who was admitted on 11/15/2019 secondary to psychosis, auditory hallucinations, and a history of use disorders including methamphetamines, cocaine and cannabis.  Objective: Patient is seen and examined.  Patient is a 28 year old male with the above-stated past psychiatric history who is seen in follow-up.  His mood is little bit better today.  He is less focused on the devil and the other issues with his delusional thinking.  He is able to smile and engage.  He denied suicidal or homicidal ideation.  He denied auditory or visual hallucinations.  His vital signs are stable, he is afebrile.  He slept 7 hours last night.  He denied any side effects to his current medications.  Review of his labs which were obtained on 2/13 showed essentially normal electrolytes.  Normal liver function enzymes.  His lipid panel was normal.  CBC was normal.  He denied any side effects to his current medications.  Principal Problem: Schizoaffective disorder, bipolar type (HCC) Diagnosis: Principal Problem:   Schizoaffective disorder, bipolar type (HCC) Active Problems:   Schizophrenia (HCC)  Total Time spent with patient: 15 minutes  Past Psychiatric History: See admission H&P  Past Medical History:  Past Medical History:  Diagnosis Date  . Bipolar 1 disorder (HCC)   . Dental abscess   . Depression   . Schizo affective schizophrenia (HCC)    History reviewed. No pertinent surgical history. Family History:  Family History  Problem Relation Age of Onset  . Stroke Other    Family Psychiatric  History: See admission H&P Social History:  Social History   Substance and Sexual Activity  Alcohol Use Yes   Comment: Drinks 3-4 times a week. Last drink: this AM     Social History   Substance and Sexual Activity  Drug Use Yes   . Frequency: 7.0 times per week  . Types: "Crack" cocaine, Heroin, MDMA (Ecstacy), Marijuana   Comment: UDS not available at time of assessment    Social History   Socioeconomic History  . Marital status: Significant Other    Spouse name: Not on file  . Number of children: Not on file  . Years of education: Not on file  . Highest education level: Not on file  Occupational History  . Not on file  Tobacco Use  . Smoking status: Current Every Day Smoker    Packs/day: 1.00    Types: Cigarettes  . Smokeless tobacco: Never Used  Substance and Sexual Activity  . Alcohol use: Yes    Comment: Drinks 3-4 times a week. Last drink: this AM  . Drug use: Yes    Frequency: 7.0 times per week    Types: "Crack" cocaine, Heroin, MDMA (Ecstacy), Marijuana    Comment: UDS not available at time of assessment  . Sexual activity: Yes  Other Topics Concern  . Not on file  Social History Narrative   Pt lives in [place] with ex-girlfriend.  Pt is not followed by an outpatient psychiatric provider.   Social Determinants of Health   Financial Resource Strain:   . Difficulty of Paying Living Expenses: Not on file  Food Insecurity:   . Worried About Programme researcher, broadcasting/film/video in the Last Year: Not on file  . Ran Out of Food in the Last Year: Not on file  Transportation Needs:   . Lack of Transportation (  Medical): Not on file  . Lack of Transportation (Non-Medical): Not on file  Physical Activity:   . Days of Exercise per Week: Not on file  . Minutes of Exercise per Session: Not on file  Stress:   . Feeling of Stress : Not on file  Social Connections:   . Frequency of Communication with Friends and Family: Not on file  . Frequency of Social Gatherings with Friends and Family: Not on file  . Attends Religious Services: Not on file  . Active Member of Clubs or Organizations: Not on file  . Attends Banker Meetings: Not on file  . Marital Status: Not on file   Additional Social  History:    Pain Medications: see MAR Prescriptions: see MAR Over the Counter: see MAR                    Sleep: Good  Appetite:  Good  Current Medications: Current Facility-Administered Medications  Medication Dose Route Frequency Provider Last Rate Last Admin  . acetaminophen (TYLENOL) tablet 650 mg  650 mg Oral Q6H PRN Dixon, Elray Buba, NP      . alum & mag hydroxide-simeth (MAALOX/MYLANTA) 200-200-20 MG/5ML suspension 30 mL  30 mL Oral Q4H PRN Jearld Lesch, NP   30 mL at 11/16/19 2008  . benztropine (COGENTIN) tablet 0.5 mg  0.5 mg Oral BID Malvin Johns, MD   0.5 mg at 11/17/19 0843  . gabapentin (NEURONTIN) capsule 300 mg  300 mg Oral TID Malvin Johns, MD   300 mg at 11/17/19 0843  . hydrOXYzine (ATARAX/VISTARIL) tablet 25 mg  25 mg Oral TID PRN Jearld Lesch, NP   25 mg at 11/16/19 2049  . magnesium hydroxide (MILK OF MAGNESIA) suspension 30 mL  30 mL Oral Daily PRN Dixon, [name] M, NP      . risperiDONE (RISPERDAL) tablet 3 mg  3 mg Oral BID Malvin Johns, MD   3 mg at 11/17/19 0843  . traZODone (DESYREL) tablet 200 mg  200 mg Oral QHS PRN Malvin Johns, MD   200 mg at 11/16/19 2049    Lab Results:  Results for orders placed or performed during the hospital encounter of 11/15/19 (from the past 48 hour(s))  CBC     Status: None   Collection Time: 11/16/19  6:28 AM  Result Value Ref Range   WBC 5.0 4.0 - 10.5 K/uL   RBC 5.09 4.22 - 5.81 MIL/uL   Hemoglobin 16.3 13.0 - 17.0 g/dL   HCT 78.2 42.3 - 53.6 %   MCV 95.5 80.0 - 100.0 fL   MCH 32.0 26.0 - 34.0 pg   MCHC 33.5 30.0 - 36.0 g/dL   RDW 14.4 31.5 - 40.0 %   Platelets 260 150 - 400 K/uL   nRBC 0.0 0.0 - 0.2 %    Comment: Performed at [facility], [address] [place], [place] [address]  Comprehensive metabolic panel     Status: Abnormal   Collection Time: 11/16/19  6:28 AM  Result Value Ref Range   Sodium 139 135 - 145 mmol/L   Potassium 3.9 3.5 - 5.1 mmol/L   Chloride 104 98 -  111 mmol/L   CO2 27 22 - 32 mmol/L   Glucose, Bld 106 (H) 70 - 99 mg/dL   BUN 7 6 - 20 mg/dL   Creatinine, Ser 9.50 0.61 - 1.24 mg/dL   Calcium 8.8 (L) 8.9 - 10.3 mg/dL   Total Protein  6.7 6.5 - 8.1 g/dL   Albumin 3.5 3.5 - 5.0 g/dL   AST 39 15 - 41 U/L   ALT 43 0 - 44 U/L   Alkaline Phosphatase 86 38 - 126 U/L   Total Bilirubin 0.9 0.3 - 1.2 mg/dL   GFR calc non Af Amer >60 >60 mL/min   GFR calc Af Amer >60 >60 mL/min   Anion gap 8 5 - 15    Comment: Performed at [facility], [address] [place], [place] [address]  Hemoglobin A1c     Status: None   Collection Time: 11/16/19  6:28 AM  Result Value Ref Range   Hgb A1c MFr Bld 5.3 4.8 - 5.6 %    Comment: (NOTE) Pre diabetes:          5.7%-6.4% Diabetes:              >6.4% Glycemic control for   <7.0% adults with diabetes    Mean Plasma Glucose 105.41 mg/dL    Comment: Performed at [facility], [address] [place], [place] [address]  Magnesium     Status: None   Collection Time: 11/16/19  6:28 AM  Result Value Ref Range   Magnesium 2.0 1.7 - 2.4 mg/dL    Comment: Performed at [facility], [address], [place], [place] [address]  Ethanol     Status: None   Collection Time: 11/16/19  6:28 AM  Result Value Ref Range   Alcohol, Ethyl (B) <10 <10 mg/dL    Comment: (NOTE) Lowest detectable limit for serum alcohol is 10 mg/dL. For medical purposes only. Performed at [facility], [address], [place], [place] [address]   Lipid panel     Status: None   Collection Time: 11/16/19  6:28 AM  Result Value Ref Range   Cholesterol 144 0 - 200 mg/dL   Triglycerides 74 <376 mg/dL   HDL 67 >28 mg/dL   Total CHOL/HDL Ratio 2.1 RATIO   VLDL 15 0 - 40 mg/dL   LDL Cholesterol 62 0 - 99 mg/dL    Comment:        Total Cholesterol/HDL:CHD Risk Coronary Heart Disease Risk Table                     Men   Women  1/2 Average Risk   3.4   3.3  Average Risk        5.0   4.4  2 X Average Risk   9.6   7.1  3 X Average Risk  23.4   11.0        Use the calculated Patient Ratio above and the CHD Risk Table to determine the patient's CHD Risk.        ATP III CLASSIFICATION (LDL):  <100     mg/dL   Optimal  315-176  mg/dL   Near or Above                    Optimal  130-159  mg/dL   Borderline  160-737  mg/dL   High  >106     mg/dL   Very High Performed at [facility], [address], [place], [place] [address]   Hepatic function panel     Status: Abnormal   Collection Time: 11/16/19  6:28 AM  Result Value Ref Range   Total Protein 6.7 6.5 - 8.1 g/dL   Albumin 3.4 (  L) 3.5 - 5.0 g/dL   AST 40 15 - 41 U/L   ALT 43 0 - 44 U/L   Alkaline Phosphatase 85 38 - 126 U/L   Total Bilirubin 0.9 0.3 - 1.2 mg/dL   Bilirubin, Direct 0.1 0.0 - 0.2 mg/dL   Indirect Bilirubin 0.8 0.3 - 0.9 mg/dL    Comment: Performed at [facility], [address], [place], [place] [address]  TSH     Status: None   Collection Time: 11/16/19  6:28 AM  Result Value Ref Range   TSH 0.595 0.350 - 4.500 uIU/mL    Comment: Performed by a 3rd Generation assay with a functional sensitivity of <=0.01 uIU/mL. Performed at [facility], [address], [place], [place] [address]   Prolactin     Status: Abnormal   Collection Time: 11/16/19  6:28 AM  Result Value Ref Range   Prolactin 31.4 (H) 4.0 - 15.2 ng/mL    Comment: (NOTE) Performed At: [facility] [address] [place], [place] [id number] Jolene Schimke MD OI:[id number]     Blood Alcohol level:  Lab Results  Component Value Date   ETH <10 11/16/2019   ETH 14 (H) 11/10/2019    Metabolic Disorder Labs: Lab Results  Component Value Date   HGBA1C 5.3 11/16/2019   MPG 105.41 11/16/2019   MPG 99.67 10/29/2019   Lab Results  Component Value Date   PROLACTIN 31.4 (H) 11/16/2019   Lab Results  Component Value Date   CHOL 144 11/16/2019   TRIG 74  11/16/2019   HDL 67 11/16/2019   CHOLHDL 2.1 11/16/2019   VLDL 15 11/16/2019   LDLCALC 62 11/16/2019   LDLCALC 53 10/29/2019    Physical Findings: AIMS:  , ,  ,  ,    CIWA:    COWS:     Musculoskeletal: Strength & Muscle Tone: within normal limits Gait & Station: normal Patient leans: N/A  Psychiatric Specialty Exam: Physical Exam  Nursing note and vitals reviewed. Constitutional: He is oriented to person, place, and time. He appears well-developed and well-nourished.  HENT:  Head: Normocephalic and atraumatic.  Respiratory: Effort normal.  Neurological: He is alert and oriented to person, place, and time.    Review of Systems  Blood pressure 110/81, pulse 94, temperature 98 F (36.7 C), temperature source Oral, resp. rate 16, SpO2 97 %.There is no height or weight on file to calculate BMI.  General Appearance: Disheveled  Eye Contact:  Fair  Speech:  Normal Rate  Volume:  Normal  Mood:  Dysphoric  Affect:  Congruent  Thought Process:  Goal Directed and Descriptions of Associations: Loose  Orientation:  Full (Time, Place, and Person)  Thought Content:  Delusions and Paranoid Ideation  Suicidal Thoughts:  No  Homicidal Thoughts:  No  Memory:  Immediate;   Fair Recent;   Fair Remote;   Fair  Judgement:  Intact  Insight:  Fair  Psychomotor Activity:  Normal  Concentration:  Concentration: Fair and Attention Span: Fair  Recall:  [employer] of Knowledge:  Fair  Language:  Good  Akathisia:  Negative  Handed:  Right  AIMS (if indicated):     Assets:  Desire for Improvement Resilience  ADL's:  Intact  Cognition:  WNL  Sleep:  Number of Hours: 7     Treatment Plan Summary: Daily contact with patient to assess and evaluate symptoms and progress in treatment, Medication management and Plan : Patient is seen and examined.  Patient is a 29 year old male with the above-stated past psychiatric history who is seen in follow-up.   Diagnosis: #1 schizoaffective  disorder; bipolar type versus substance-induced psychotic disorder, #2 methamphetamine dependence, #3 cocaine dependence, #4 marijuana dependence  Patient is seen in follow-up.  He appears to be slowly improving.  He is less focused on his delusions about the devil.  It appears to be less disturbing to him.  The increase in the gabapentin seems to have decreased some of his irritability and anxiety.  He continues on the Risperdal 3 mg p.o. twice daily.  No changes in his medications, and hopefully he will continue to improve.  1.  Continue Cogentin 0.5 mg p.o. twice daily for side effects of medication. 2.  Continue gabapentin 400 mg p.o. 3 times daily for irritability, anxiety and chronic pain. 3.  Continue Risperdal 3 mg p.o. twice daily for psychosis. 4.  Continue trazodone 200 mg p.o. nightly as needed insomnia. 5.  Disposition planning-in progress.  Sharma Covert, MD 11/17/2019, 11:45 AM

## 2019-11-18 MED ORDER — ENSURE ENLIVE PO LIQD: 237.0000 mL | ORAL | Status: DC

## 2019-11-18 MED ORDER — QUETIAPINE FUMARATE 200 MG PO TABS: 200.0000 mg | ORAL_TABLET | Freq: Every day | ORAL | 1 refills | Status: DC

## 2019-11-18 MED ORDER — RISPERIDONE 3 MG PO TABS: 3.0000 mg | ORAL_TABLET | Freq: Two times a day (BID) | ORAL | 2 refills | Status: DC

## 2019-11-18 MED ORDER — ADULT MULTIVITAMIN W/MINERALS CH
1.0000 | ORAL_TABLET | Freq: Every day | ORAL | Status: DC
  Filled 2019-11-18 (×3): qty 1

## 2019-11-18 MED ORDER — GABAPENTIN 300 MG PO CAPS: 300.0000 mg | ORAL_CAPSULE | Freq: Three times a day (TID) | ORAL | 2 refills | Status: DC

## 2019-11-18 MED ORDER — QUETIAPINE FUMARATE 200 MG PO TABS: 200.0000 mg | ORAL_TABLET | Freq: Every day | ORAL | Status: DC

## 2019-11-18 MED ORDER — BENZTROPINE MESYLATE 0.5 MG PO TABS: 0.5000 mg | ORAL_TABLET | Freq: Two times a day (BID) | ORAL | 2 refills | Status: DC

## 2019-11-18 NOTE — Progress Notes (Signed)
  [facility] Adult Case Management Discharge Plan :  Will you be returning to the same living situation after discharge:  Yes,  home At discharge, do you have transportation home?: Yes,  Bus passes Do you have the ability to pay for your medications: No.  Release of information consent forms completed and in the chart;  Patient's signature needed at discharge.  Patient to Follow up at: Follow-up Information    Patient declines aftercare services. Follow up.           Next level of care provider has access to [facility] Link:no  Safety Planning and Suicide Prevention discussed: No.; Patient declined SPE; with pt     Has patient been referred to the Quitline?: Patient refused referral  Patient has been referred for addiction treatment: Pt. refused referral  Delphia Grates, LCSW 11/18/2019, 9:45 AM

## 2019-11-18 NOTE — Tx Team (Signed)
Interdisciplinary Treatment and Diagnostic Plan Update  11/18/2019 Time of Session: 11:10am Akash Winski MRN: [id number]  Principal Diagnosis: Schizoaffective disorder, bipolar type [facility])  Secondary Diagnoses: Principal Problem:   Schizoaffective disorder, bipolar type ([place]) Active Problems:   Schizophrenia ([place])   Current Medications:  Current Facility-Administered Medications  Medication Dose Route Frequency Provider Last Rate Last Admin  . acetaminophen (TYLENOL) tablet 650 mg  650 mg Oral Q6H PRN Deloria Lair, NP   650 mg at 11/18/19 0024  . alum & mag hydroxide-simeth (MAALOX/MYLANTA) 200-200-20 MG/5ML suspension 30 mL  30 mL Oral Q4H PRN Deloria Lair, NP   30 mL at 11/16/19 2008  . benztropine (COGENTIN) tablet 0.5 mg  0.5 mg Oral BID [name], MD   0.5 mg at 11/18/19 0913  . feeding supplement (ENSURE ENLIVE) (ENSURE ENLIVE) liquid 237 mL  237 mL Oral Q24H Hampton Abbot, MD      . gabapentin (NEURONTIN) capsule 300 mg  300 mg Oral TID [name], MD   300 mg at 11/18/19 0913  . hydrOXYzine (ATARAX/VISTARIL) tablet 25 mg  25 mg Oral TID PRN Deloria Lair, NP   25 mg at 11/16/19 2049  . magnesium hydroxide (MILK OF MAGNESIA) suspension 30 mL  30 mL Oral Daily PRN Deloria Lair, NP      . multivitamin with minerals tablet 1 tablet  1 tablet Oral Daily Hampton Abbot, MD      . risperiDONE (RISPERDAL) tablet 3 mg  3 mg Oral BID [name], MD   3 mg at 11/18/19 0913  . traZODone (DESYREL) tablet 200 mg  200 mg Oral QHS PRN [name], MD   200 mg at 11/17/19 2029   PTA Medications: Medications Prior to Admission  Medication Sig Dispense Refill Last Dose  . acetaminophen (TYLENOL) 325 MG tablet Take 650 mg by mouth every 6 (six) hours as needed for mild pain or headache.     Marland Kitchen FLUoxetine (PROZAC) 10 MG capsule Take 1 capsule (10 mg total) by mouth daily. (Patient not taking: Reported on 11/15/2019) 30 capsule 0 Not Taking at Unknown time  . Multiple Vitamin  (MULTIVITAMIN WITH MINERALS) TABS tablet Take 1 tablet by mouth daily. (Patient not taking: Reported on 11/07/2019)     . [DISCONTINUED] QUEtiapine (SEROQUEL) 200 MG tablet Take 1 tablet (200 mg total) by mouth at bedtime. (Patient not taking: Reported on 11/15/2019) 30 tablet 0 Not Taking at Unknown time    Patient Stressors:    Patient Strengths:    Treatment Modalities: Medication Management, Group therapy, Case management,  1 to 1 session with clinician, Psychoeducation, Recreational therapy.   Physician Treatment Plan for Primary Diagnosis: Schizoaffective disorder, bipolar type ([place]) Long Term Goal(s): Improvement in symptoms so as ready for discharge Improvement in symptoms so as ready for discharge   Short Term Goals: Ability to verbalize feelings will improve Ability to disclose and discuss suicidal ideas Ability to demonstrate self-control will improve Ability to identify and develop effective coping behaviors will improve Ability to maintain clinical measurements within normal limits will improve Ability to disclose and discuss suicidal ideas Ability to demonstrate self-control will improve Ability to identify and develop effective coping behaviors will improve Ability to maintain clinical measurements within normal limits will improve Compliance with prescribed medications will improve  Medication Management: Evaluate patient's response, side effects, and tolerance of medication regimen.  Therapeutic Interventions: 1 to 1 sessions, Unit Group sessions and Medication administration.  Evaluation of Outcomes: Adequate for Discharge  Physician Treatment Plan for Secondary Diagnosis: Principal Problem:   Schizoaffective disorder, bipolar type (HCC) Active Problems:   Schizophrenia (H[facility])  Long Term Goal(s): Improvement in symptoms so as ready for discharge Improvement in symptoms so as ready for discharge   Short Term Goals: Ability to verbalize feelings will  improve Ability to disclose and discuss suicidal ideas Ability to demonstrate self-control will improve Ability to identify and develop effective coping behaviors will improve Ability to maintain clinical measurements within normal limits will improve Ability to disclose and discuss suicidal ideas Ability to demonstrate self-control will improve Ability to identify and develop effective coping behaviors will improve Ability to maintain clinical measurements within normal limits will improve Compliance with prescribed medications will improve     Medication Management: Evaluate patient's response, side effects, and tolerance of medication regimen.  Therapeutic Interventions: 1 to 1 sessions, Unit Group sessions and Medication administration.  Evaluation of Outcomes: Adequate for Discharge   RN Treatment Plan for Primary Diagnosis: Schizoaffective disorder, bipolar type (HCC) Long Term Goal(s): Knowledge of disease and therapeutic regimen to maintain health will improve  Short Term Goals: Ability to participate in decision making will improve, Ability to verbalize feelings will improve, Ability to disclose and discuss suicidal ideas, Ability to identify and develop effective coping behaviors will improve and Compliance with prescribed medications will improve  Medication Management: RN will administer medications as ordered by provider, will assess and evaluate patient's response and provide education to patient for prescribed medication. RN will report any adverse and/or side effects to prescribing provider.  Therapeutic Interventions: 1 on 1 counseling sessions, Psychoeducation, Medication administration, Evaluate responses to treatment, Monitor vital signs and CBGs as ordered, Perform/monitor CIWA, COWS, AIMS and Fall Risk screenings as ordered, Perform wound care treatments as ordered.  Evaluation of Outcomes: Adequate for Discharge   LCSW Treatment Plan for Primary Diagnosis:  Schizoaffective disorder, bipolar type (HCC) Long Term Goal(s): Safe transition to appropriate next level of care at discharge, Engage patient in therapeutic group addressing interpersonal concerns.  Short Term Goals: Engage patient in aftercare planning with referrals and resources and Increase skills for wellness and recovery  Therapeutic Interventions: Assess for all discharge needs, 1 to 1 time with Social worker, Explore available resources and support systems, Assess for adequacy in community support network, Educate family and significant other(s) on suicide prevention, Complete Psychosocial Assessment, Interpersonal group therapy.  Evaluation of Outcomes: Adequate for Discharge   Progress in Treatment: Attending groups: No. Participating in groups: No. Taking medication as prescribed: Yes. Toleration medication: Yes. Family/Significant other contact made: No, will contact:  Pt declined SPE; with pt Patient understands diagnosis: Yes. Discussing patient identified problems/goals with staff: Yes. Medical problems stabilized or resolved: Yes. Denies suicidal/homicidal ideation: Yes. Issues/concerns per patient self-inventory: No. Other:   New problem(s) identified: No, Describe:  None  New Short Term/Long Term Goal(s): Medication stabilization, elimination of SI thoughts, and development of a comprehensive mental wellness plan.   Patient Goals:  Patient discharged before able to obtain a goal/treatment team.   Discharge Plan or Barriers: Patient discharged home and denied wanting any aftercare follow up.   Reason for Continuation of Hospitalization: Patient is discharging today.   Estimated Length of Stay: Patient is discharging today.   Attendees: Patient: 11/18/2019  Physician:  11/18/2019   Nursing:  11/18/2019   RN Care Manager: 11/18/2019   Social Worker: Stephannie Peters, LCSW 11/18/2019   Recreational Therapist:  11/18/2019   Other:  11/18/2019  Other:  11/18/2019  Other: 11/18/2019     Scribe for Treatment Team: Delphia Grates, LCSW 11/18/2019 11:34 AM

## 2019-11-18 NOTE — [facility] Suicide Risk Assessment (Signed)
[facility] Discharge Suicide Risk Assessment   Principal Problem: Schizoaffective disorder, bipolar type [facility]) Discharge Diagnoses: Principal Problem:   Schizoaffective disorder, bipolar type (HCC) Active Problems:   Schizophrenia (HCC)   Total Time spent with patient: 45 minutes  Musculoskeletal: Strength & Muscle Tone: within normal limits Gait & Station: normal Patient leans: N/A  Psychiatric Specialty Exam: Review of Systems  Blood pressure 139/83, pulse 81, temperature 98 F (36.7 C), resp. rate 16, SpO2 99 %.There is no height or weight on file to calculate BMI.  General Appearance: Disheveled  Eye Contact::  Good  Speech:  Clear and Coherent409  Volume:  Decreased  Mood:  Euthymic  Affect:  Constricted  Thought Process:  Coherent and Goal Directed  Orientation:  Full (Time, Place, and Person)  Thought Content:  No delusional believes reported no hallucinations  Suicidal Thoughts:  No  Homicidal Thoughts:  No  Memory:  Immediate;   Fair Recent;   Fair Remote;   Fair  Judgement:  Fair  Insight:  Fair  Psychomotor Activity:  Normal  Concentration:  Fair  Recall:  [employer] of Knowledge:Fair  Language: Fair  Akathisia:  Negative  Handed:  Right  AIMS (if indicated):     Assets:  Communication Skills Desire for Improvement  Sleep:  Number of Hours: 7  Cognition: WNL  ADL's:  Intact   Mental Status Per Nursing Assessment::   On Admission:  Suicidal ideation indicated by patient  Demographic Factors:  Male, Caucasian and Low socioeconomic status  Loss Factors: Decrease in vocational status  Historical Factors: Impulsivity  Risk Reduction Factors:   Positive social support and Positive therapeutic relationship  Continued Clinical Symptoms:  Alcohol/Substance Abuse/Dependencies  Cognitive Features That Contribute To Risk:  Polarized thinking    Suicide Risk:  Minimal: No identifiable suicidal ideation.  Patients presenting with no risk factors but with  morbid ruminations; may be classified as minimal risk based on the severity of the depressive symptoms  Greatest risk to this patient would be a nonintentional overdose  Plan Of Care/Follow-up recommendations:  Activity:  full  Payslee Bateson, MD 11/18/2019, 8:07 AM

## 2019-11-18 NOTE — Progress Notes (Signed)
NUTRITION ASSESSMENT RD working remotely.   Pt identified as at risk on the Malnutrition Screen Tool  INTERVENTION: - weigh patient today.  - will order Ensure Enlive once/day, each supplement provides 350 kcal and 20 grams of protein. - will order daily multivitamin with minerals.   NUTRITION DIAGNOSIS: Unintentional weight loss related to sub-optimal intake as evidenced by pt report.   Goal: Pt to meet >/= 90% of their estimated nutrition needs.  Monitor:  PO intake  Assessment:  Patient admitted for auditory hallucinations and drug use/abuse. Notes indicate patient has been paranoid. Patient was admitted to the hospital prior to d/c on 2/12 to [facility].   Per chart review, patient was last weighed on 2/4 at which time he weighed 135 lb. Weight on 1/4 was 138 lb. This indicates 3 lb weight loss (2.2% body weight) in 1 month; not significant for time frame.    29 y.o. male  Height: Ht Readings from Last 1 Encounters:  11/07/19 5\' 5"  (1.651 m)    Weight: Wt Readings from Last 1 Encounters:  11/07/19 61.2 kg    Weight Hx: Wt Readings from Last 10 Encounters:  11/07/19 61.2 kg  10/28/19 60.1 kg  10/07/19 62.6 kg  08/24/19 65.8 kg  03/18/19 59 kg  03/11/19 61.2 kg  03/06/19 61.2 kg  02/22/19 61.2 kg  09/13/17 59 kg  05/02/17 58.1 kg     Estimated Nutritional Needs: Kcal: 25-30 kcal/kg Protein: > 1 gram protein/kg Fluid: 1 ml/kcal  Diet Order:  Diet Order            Diet regular Room service appropriate? Yes; Fluid consistency: Thin  Diet effective now             Pt is also offered choice of unit snacks mid-morning and mid-afternoon.  Pt is eating as desired.   Lab results and medications reviewed.     05/04/17, MS, RD, LDN, CNSC Inpatient Clinical Dietitian RD pager # available in [facility]  After hours/weekend pager # available in [facility]

## 2019-11-18 NOTE — Discharge Summary (Signed)
Physician Discharge Summary Note  Patient:  Mark Hammond is an 29 y.o., male MRN:  [id number] DOB:  1991-01-17 Patient phone:  [phone] (home)  Patient address:   [place] [address],  Total Time spent with patient: 45 minutes  Date of Admission:  11/15/2019 Date of Discharge: 11/18/2019  Reason for Admission:    History of Present Illness:  This is the latest of multiple admissions/healthcare system encounters for Mark Hammond, he is 76 and has a chronic substance abuse history which has induced a chronic psychotic disorder.  He presented on 2/7 under petition but was released on 2/8, only to present again on 2/12 with a cluster of psychotic symptoms and self-harm/multiple self-inflicted wounds on his arms.  He states it is "religious" reports recent auditory hallucinations, reports seeing "crosses pentagrams and the #6 over and over"  When asked what drugs the patient is abusing he states "everything but heroin" and on the 2/7 presentation drug screen reflects abuse of methamphetamines, cocaine, and cannabis.  Blood alcohol level on 2/7 was 14-  Patient continues to express delusional believes states he cut himself because he is "religious".  According to the assessment note of earlier this a.m.:  Mark Hammond an 28 y.o.male.Pt presents to [facility] as a walk in voluntarily unaccompanied for suicidal thoughts and hallucinations. Pt states he is having repetitive SI thoughts and HI thoughts. Pt states that he thought about overdosing today and has had SI attempts in the past. Pt states he constantly thinks about religion, and has hallucinations of seeing the number "666" as well as crosses. 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 Pt states that he hears voices as well and this is constant and ongoing for him. Pt states that AVH are not commanding him to do anything. Pt was last assessed and admitted to [facility] on 11/10/19 for SI and  hallucinations. Pt has a history of presenting to the hospital multiple times for similar presentation, this will be his sixth assessment in 2021. Pt currently reports he last used drugs around 9pm, states he used crack and drank a few beers. During assessment pt presented with strong body odor of alcohol. Per pt chart history, pt has abused other drugs such as cocaine, meth and marijuana. Pt currently states that he also engaged in self injurious behaviors 2 days ago, by cutting himself. Pt presents with visible superficial cuts on his left forearm. Pt endorsed the following symptoms of depression such as hopelessness, worthlessness, anxiety and irritability. Pt reports getting 7 to 9 hours a sleep night but a poor appetite. Pt currently reports he is homeless and lives with his girlfriend on and off over the last few years. 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 Pt reports his current stressor as having relationship issues with his girlfriend no other stressors. Pt denies any experience of trauma/abuse. Pt reports he is taking Prozac and Seroquel but last took these medications 1 week ago. Pt reports he does not have a provider and was prescribed medications from last hospital admission.  During assessment, Pt presented asquiet but awake, he had body odor of alcohol. He hadfaireye contact. Demeanor was hyperactive/restless. Pt was dressed in street clothes, and he appeared disheveled. Pt's mood and affect were preoccupied. Pt's speech was rapid, but otherwise normal in rhythm and volume. Thought processes were rapid, and thought content wasa little coherent andcircumstantial. Pt's memory and concentration were fair. Insight, judgment, and impulse control  were poor.   Diagnosis:Schizoaffective Disorder, Bipolar I type; Polysubstance Use  Principal Problem: Schizoaffective disorder, bipolar type [facility]) Discharge Diagnoses: Principal Problem:   Schizoaffective disorder,  bipolar type (HCC) Active Problems:   Schizophrenia (HCC)   Past Psychiatric History: As discussed prior similar presentations we believe that his chronic substance abuse has led to a permanent psychotic disorder of a schizoaffective type  Past Medical History:  Past Medical History:  Diagnosis Date  . Bipolar 1 disorder (HCC)   . Dental abscess   . Depression   . Schizo affective schizophrenia (HCC)    History reviewed. No pertinent surgical history. Family History:  Family History  Problem Relation Age of Onset  . Stroke Other    Family Psychiatric  History: Shared no new data at the point of discharge Social History:  Social History   Substance and Sexual Activity  Alcohol Use Yes   Comment: Drinks 3-4 times a week. Last drink: this AM     Social History   Substance and Sexual Activity  Drug Use Yes  . Frequency: 7.0 times per week  . Types: "Crack" cocaine, Heroin, MDMA (Ecstacy), Marijuana   Comment: UDS not available at time of assessment    Social History   Socioeconomic History  . Marital status: Significant Other    Spouse name: Not on file  . Number of children: Not on file  . Years of education: Not on file  . Highest education level: Not on file  Occupational History  . Not on file  Tobacco Use  . Smoking status: Current Every Day Smoker    Packs/day: 1.00    Types: Cigarettes  . Smokeless tobacco: Never Used  Substance and Sexual Activity  . Alcohol use: Yes    Comment: Drinks 3-4 times a week. Last drink: this AM  . Drug use: Yes    Frequency: 7.0 times per week    Types: "Crack" cocaine, Heroin, MDMA (Ecstacy), Marijuana    Comment: UDS not available at time of assessment  . Sexual activity: Yes  Other Topics Concern  . Not on file  Social History Narrative   Pt lives in [place] with ex-girlfriend.  Pt is not followed by an outpatient psychiatric provider.   Social Determinants of Health   Financial Resource Strain:   . Difficulty  of Paying Living Expenses: Not on file  Food Insecurity:   . Worried About Programme researcher, broadcasting/film/video in the Last Year: Not on file  . Ran Out of Food in the Last Year: Not on file  Transportation Needs:   . Lack of Transportation (Medical): Not on file  . Lack of Transportation (Non-Medical): Not on file  Physical Activity:   . Days of Exercise per Week: Not on file  . Minutes of Exercise per Session: Not on file  Stress:   . Feeling of Stress : Not on file  Social Connections:   . Frequency of Communication with Friends and Family: Not on file  . Frequency of Social Gatherings with Friends and Family: Not on file  . Attends Religious Services: Not on file  . Active Member of Clubs or Organizations: Not on file  . Attends Banker Meetings: Not on file  . Marital Status: Not on file    Hospital Course:   Mark Hammond was admitted under routine precautions he was started on Risperdal for the acute psychosis, given gabapentin to help with withdrawal symptoms, and monitored under 15-minute checks. He did participate somewhat better individually  than in groups he had no significant withdrawal symptoms. By the date of the 13th the patient was able to articulate that his psychotic symptoms only showed up "when he was doing drugs" and absent drugs he seem to recalibrate very quickly with the medications given.  By the date of the 14th he was described as less focused on previously expressed delusional material related to "the devil"  By the date of the 15th I thought he was baseline he was noted to be alert fully oriented except to exact date he said it was the 14th when it was the 15th, but he expressed no delusional material had no thoughts of harming self or others and reported no hallucinations.  He rejected long-acting injectable he rejects rehabilitation we will offered 1 more time and once again attempt to get him in some type of meaningful rehab at least as an  outpatient  Musculoskeletal: Strength & Muscle Tone: within normal limits Gait & Station: normal Patient leans: N/A  Psychiatric Specialty Exam: Review of Systems  Blood pressure 139/83, pulse 81, temperature 98 F (36.7 C), resp. rate 16, SpO2 99 %.There is no height or weight on file to calculate BMI.  General Appearance: Disheveled  Eye Contact::  Good  Speech:  Clear and Coherent409  Volume:  Decreased  Mood:  Euthymic  Affect:  Constricted  Thought Process:  Coherent and Goal Directed  Orientation:  Full (Time, Place, and Person)  Thought Content:  No delusional believes reported no hallucinations  Suicidal Thoughts:  No  Homicidal Thoughts:  No  Memory:  Immediate;   Fair Recent;   Fair Remote;   Fair  Judgement:  Fair  Insight:  Fair  Psychomotor Activity:  Normal  Concentration:  Fair  Recall:  Fair  Fund of Knowledge:Fair  Language: Fair  Akathisia:  Negative  Handed:  Right  AIMS (if indicated):     Assets:  Communication Skills Desire for Improvement  Sleep:  Number of Hours: 7  Cognition: WNL  ADL's:  Intact      Has this patient used any form of tobacco in the last 30 days? (Cigarettes, Smokeless Tobacco, Cigars, and/or Pipes) Yes, No  Blood Alcohol level:  Lab Results  Component Value Date   ETH <10 11/16/2019   ETH 14 (H) 11/10/2019    Metabolic Disorder Labs:  Lab Results  Component Value Date   HGBA1C 5.3 11/16/2019   MPG 105.41 11/16/2019   MPG 99.67 10/29/2019   Lab Results  Component Value Date   PROLACTIN 31.4 (H) 11/16/2019   Lab Results  Component Value Date   CHOL 144 11/16/2019   TRIG 74 11/16/2019   HDL 67 11/16/2019   CHOLHDL 2.1 11/16/2019   VLDL 15 11/16/2019   LDLCALC 62 11/16/2019   LDLCALC 53 10/29/2019    See Psychiatric Specialty Exam and Suicide Risk Assessment completed by Attending Physician prior to discharge.  Discharge destination:  Home  Is patient on multiple antipsychotic therapies at discharge:   No   Has Patient had three or more failed trials of antipsychotic monotherapy by history:  No  Recommended Plan for Multiple Antipsychotic Therapies: NA   Allergies as of 11/18/2019      Reactions   Codeine Other (See Comments)   Patient states parents told him he was allergic at a young age.      Medication List    STOP taking these medications   acetaminophen 325 MG tablet Commonly known as: TYLENOL   FLUoxetine 10  MG capsule Commonly known as: PROZAC   multivitamin with minerals Tabs tablet     TAKE these medications     Indication  benztropine 0.5 MG tablet Commonly known as: COGENTIN Take 1 tablet (0.5 mg total) by mouth 2 (two) times daily.  Indication: Extrapyramidal Reaction caused by Medications   gabapentin 300 MG capsule Commonly known as: NEURONTIN Take 1 capsule (300 mg total) by mouth 3 (three) times daily.  Indication: Restless Leg Syndrome   QUEtiapine 200 MG tablet Commonly known as: SEROQUEL Take 1 tablet (200 mg total) by mouth at bedtime.  Indication: Depressive Phase of Manic-Depression, Psychosis   risperiDONE 3 MG tablet Commonly known as: RISPERDAL Take 1 tablet (3 mg total) by mouth 2 (two) times daily.  Indication: Hypomanic Episode of Bipolar Disorder        Follow-up recommendations:  Activity:  full  Comments: As discussed in the suicide risk assessment the greatest risk to this patient would be a nonintentional overdose  SignedMalvin Johns, MD 11/18/2019, 8:09 AM

## 2019-11-18 NOTE — Progress Notes (Signed)
Patient ID: Mark Hammond, male   DOB: 08-16-1991, 29 y.o.   MRN: [id number] Patient discharged to home/self care in the on his own accord.  Patient denies SI, HI an AVH upon discharge.  Patient reported a decrease in symptoms which proceeded admission.  Patient acknowledged understanding of discharge instructions and receipt of personal belongings.

## 2019-11-18 NOTE — Progress Notes (Signed)
Recreation Therapy Notes  Date: 2.15.21 Time: 1000 Location: 500 Hall Dayroom  Group Topic: Coping Skills  Goal Area(s) Addresses:  Patient will identify positive coping skills. Patient will identify benefit of using positive coping skills.  Behavioral Response: Engaged  Intervention: Magazines, Holiday representative paper, scissors, glue sticks  Activity: Coping Collage.  Patients were to use magazine to identify pictures of coping skills that could be used for diversions, social, cognitive, tension releasers and physical.  Patients were to find at least two positive coping skills for each.   Education: Pharmacologist, Building control surveyor.   Education Outcome: Acknowledges understanding/In group clarification offered/Needs additional education.   Clinical Observations/Feedback: Pt was quiet but engaged.  Pt worked on collage until he left for discharge.      Caroll Rancher, LRT/CTRS    Caroll Rancher A 11/18/2019 11:43 AM

## 2020-02-02 ENCOUNTER — Emergency Department (HOSPITAL_COMMUNITY)
Admission: EM | Admit: 2020-02-02 | Discharge: 2020-02-03 | Disposition: A | Discharge status: Home / Self Care | Payer: Self-pay | Attending: Emergency Medicine | Admitting: Emergency Medicine

## 2020-02-02 DIAGNOSIS — R451 Restlessness and agitation: Secondary | ICD-10-CM | POA: Insufficient documentation

## 2020-02-02 DIAGNOSIS — F1910 Other psychoactive substance abuse, uncomplicated: Secondary | ICD-10-CM | POA: Insufficient documentation

## 2020-02-02 DIAGNOSIS — R1013 Epigastric pain: Secondary | ICD-10-CM | POA: Insufficient documentation

## 2020-02-02 DIAGNOSIS — R443 Hallucinations, unspecified: Secondary | ICD-10-CM

## 2020-02-02 DIAGNOSIS — Z046 Encounter for general psychiatric examination, requested by authority: Secondary | ICD-10-CM | POA: Insufficient documentation

## 2020-02-02 DIAGNOSIS — F1994 Other psychoactive substance use, unspecified with psychoactive substance-induced mood disorder: Secondary | ICD-10-CM | POA: Diagnosis present

## 2020-02-02 DIAGNOSIS — R45851 Suicidal ideations: Secondary | ICD-10-CM | POA: Insufficient documentation

## 2020-02-02 DIAGNOSIS — F250 Schizoaffective disorder, bipolar type: Secondary | ICD-10-CM | POA: Diagnosis present

## 2020-02-02 DIAGNOSIS — F1020 Alcohol dependence, uncomplicated: Secondary | ICD-10-CM | POA: Diagnosis present

## 2020-02-02 DIAGNOSIS — R112 Nausea with vomiting, unspecified: Secondary | ICD-10-CM | POA: Insufficient documentation

## 2020-02-02 DIAGNOSIS — F17210 Nicotine dependence, cigarettes, uncomplicated: Secondary | ICD-10-CM | POA: Insufficient documentation

## 2020-02-02 DIAGNOSIS — Z20822 Contact with and (suspected) exposure to covid-19: Secondary | ICD-10-CM | POA: Insufficient documentation

## 2020-02-02 DIAGNOSIS — F1120 Opioid dependence, uncomplicated: Secondary | ICD-10-CM | POA: Diagnosis present

## 2020-02-02 DIAGNOSIS — Z9114 Patient's other noncompliance with medication regimen: Secondary | ICD-10-CM | POA: Insufficient documentation

## 2020-02-02 DIAGNOSIS — Z79899 Other long term (current) drug therapy: Secondary | ICD-10-CM | POA: Insufficient documentation

## 2020-02-02 DIAGNOSIS — F1920 Other psychoactive substance dependence, uncomplicated: Secondary | ICD-10-CM | POA: Diagnosis present

## 2020-02-02 LAB — CBC WITH DIFFERENTIAL/PLATELET
Abs Immature Granulocytes: 0.02 10*3/uL (ref 0.00–0.07)
Basophils Absolute: 0.1 10*3/uL (ref 0.0–0.1)
Basophils Relative: 1 %
Eosinophils Absolute: 0 10*3/uL (ref 0.0–0.5)
Eosinophils Relative: 0 %
HCT: 46.3 % (ref 39.0–52.0)
Hemoglobin: 16.2 g/dL (ref 13.0–17.0)
Immature Granulocytes: 0 %
Lymphocytes Relative: 11 %
Lymphs Abs: 1.2 10*3/uL (ref 0.7–4.0)
MCH: 32.5 pg (ref 26.0–34.0)
MCHC: 35 g/dL (ref 30.0–36.0)
MCV: 92.8 fL (ref 80.0–100.0)
Monocytes Absolute: 0.7 10*3/uL (ref 0.1–1.0)
Monocytes Relative: 6 %
Neutro Abs: 8.5 10*3/uL — ABNORMAL HIGH (ref 1.7–7.7)
Neutrophils Relative %: 82 %
Platelets: 266 10*3/uL (ref 150–400)
RBC: 4.99 MIL/uL (ref 4.22–5.81)
RDW: 13.4 % (ref 11.5–15.5)
WBC: 10.4 10*3/uL (ref 4.0–10.5)
nRBC: 0 % (ref 0.0–0.2)

## 2020-02-02 LAB — RAPID URINE DRUG SCREEN, HOSP PERFORMED
Amphetamines: NOT DETECTED
Barbiturates: NOT DETECTED
Benzodiazepines: NOT DETECTED
Cocaine: POSITIVE — AB
Opiates: NOT DETECTED
Tetrahydrocannabinol: POSITIVE — AB

## 2020-02-02 LAB — COMPREHENSIVE METABOLIC PANEL
ALT: 69 U/L — ABNORMAL HIGH (ref 0–44)
AST: 104 U/L — ABNORMAL HIGH (ref 15–41)
Albumin: 4.4 g/dL (ref 3.5–5.0)
Alkaline Phosphatase: 94 U/L (ref 38–126)
Anion gap: 12 (ref 5–15)
BUN: 9 mg/dL (ref 6–20)
CO2: 28 mmol/L (ref 22–32)
Calcium: 8.7 mg/dL — ABNORMAL LOW (ref 8.9–10.3)
Chloride: 98 mmol/L (ref 98–111)
Creatinine, Ser: 0.72 mg/dL (ref 0.61–1.24)
GFR calc Af Amer: >60 mL/min (ref >60)
GFR calc non Af Amer: >60 mL/min (ref >60)
Glucose, Bld: 112 mg/dL — ABNORMAL HIGH (ref 70–99)
Potassium: 3.1 mmol/L — ABNORMAL LOW (ref 3.5–5.1)
Sodium: 138 mmol/L (ref 135–145)
Total Bilirubin: 1.2 mg/dL (ref 0.3–1.2)
Total Protein: 7.5 g/dL (ref 6.5–8.1)

## 2020-02-02 LAB — RESPIRATORY PANEL BY RT PCR (FLU A&B, COVID)
Influenza A by PCR: NEGATIVE
Influenza B by PCR: NEGATIVE
SARS Coronavirus 2 by RT PCR: NEGATIVE

## 2020-02-02 LAB — LIPASE, BLOOD: Lipase: 17 U/L (ref 11–51)

## 2020-02-02 LAB — SALICYLATE LEVEL: Salicylate Lvl: <7 mg/dL — ABNORMAL LOW (ref 7.0–30.0)

## 2020-02-02 LAB — ACETAMINOPHEN LEVEL: Acetaminophen (Tylenol), Serum: <10 ug/mL — ABNORMAL LOW (ref 10–30)

## 2020-02-02 LAB — ETHANOL: Alcohol, Ethyl (B): 46 mg/dL — ABNORMAL HIGH (ref <10)

## 2020-02-02 MED ORDER — POTASSIUM CHLORIDE CRYS ER 20 MEQ PO TBCR
40.0000 meq | EXTENDED_RELEASE_TABLET | Freq: Once | ORAL | Status: AC
  Administered 2020-02-02: 40 meq via ORAL
  Filled 2020-02-02: qty 2

## 2020-02-02 MED ORDER — NICOTINE 21 MG/24HR TD PT24
21.0000 mg | MEDICATED_PATCH | Freq: Every day | TRANSDERMAL | Status: DC
  Administered 2020-02-03: 21 mg via TRANSDERMAL
  Filled 2020-02-02: qty 1

## 2020-02-02 MED ORDER — GABAPENTIN 300 MG PO CAPS
300.0000 mg | ORAL_CAPSULE | Freq: Three times a day (TID) | ORAL | Status: DC
  Administered 2020-02-02 – 2020-02-03 (×2): 300 mg via ORAL
  Filled 2020-02-02 (×2): qty 1

## 2020-02-02 MED ORDER — ALUM & MAG HYDROXIDE-SIMETH 200-200-20 MG/5ML PO SUSP
30.0000 mL | Freq: Once | ORAL | Status: AC
  Administered 2020-02-02: 30 mL via ORAL
  Filled 2020-02-02: qty 30

## 2020-02-02 MED ORDER — ONDANSETRON 4 MG PO TBDP
4.0000 mg | ORAL_TABLET | Freq: Once | ORAL | Status: AC
  Administered 2020-02-02: 4 mg via ORAL
  Filled 2020-02-02: qty 1

## 2020-02-02 MED ORDER — RISPERIDONE 2 MG PO TABS
3.0000 mg | ORAL_TABLET | Freq: Two times a day (BID) | ORAL | Status: DC
  Administered 2020-02-02 – 2020-02-03 (×2): 3 mg via ORAL
  Filled 2020-02-02 (×2): qty 1

## 2020-02-02 MED ORDER — QUETIAPINE FUMARATE 100 MG PO TABS
200.0000 mg | ORAL_TABLET | Freq: Every day | ORAL | Status: DC
  Administered 2020-02-02: 200 mg via ORAL
  Filled 2020-02-02: qty 2

## 2020-02-02 MED ORDER — ACETAMINOPHEN 325 MG PO TABS
650.0000 mg | ORAL_TABLET | ORAL | Status: DC | PRN
  Administered 2020-02-02: 650 mg via ORAL
  Filled 2020-02-02: qty 2

## 2020-02-02 MED ORDER — BENZTROPINE MESYLATE 0.5 MG PO TABS
0.5000 mg | ORAL_TABLET | Freq: Two times a day (BID) | ORAL | Status: DC
  Administered 2020-02-02 – 2020-02-03 (×2): 0.5 mg via ORAL
  Filled 2020-02-02 (×2): qty 1

## 2020-02-02 NOTE — ED Provider Notes (Signed)
Screening labs overall unremarkable besides mild hypokalemia and elevated LFTs (likely ETOH/drugs). Cocaine positive. Will consult tts  The patient has been placed in psychiatric observation due to the need to provide a safe environment for the patient while obtaining psychiatric consultation and evaluation, as well as ongoing medical and medication management to treat the patient's condition.  The patient has not been placed under full IVC at this time.    Pricilla Loveless, MD 02/02/20 2257

## 2020-02-02 NOTE — ED Provider Notes (Signed)
[facility]-EMERGENCY DEPT Provider Note   CSN: [id number] Arrival date & time: 02/02/20  2112     History No chief complaint on file.   Mark Hammond is a 29 y.o. male.  29 yo M with a chief complaints of increasing hallucinations and concern for self-harm.  Patient states that he has been hallucinating recently and they have been telling him to harm himself and has been having thoughts of committing suicide and is becoming more and more likely to try and follow through with them.  He also states that he has been abusing drugs.  He is supposed to be taking medications for his mental health but has not because he never got them filled.  He is also been having epigastric abdominal pain and nausea and vomiting for about 6 hours.  The history is provided by the patient.  Illness Severity:  Moderate Onset quality:  Gradual Duration:  2 days Timing:  Constant Progression:  Worsening Chronicity:  New Associated symptoms: abdominal pain (epigastric), nausea and vomiting   Associated symptoms: no chest pain, no congestion, no diarrhea, no fever, no headaches, no myalgias, no rash and no shortness of breath        Past Medical History:  Diagnosis Date  . Bipolar 1 disorder (HCC)   . Dental abscess   . Depression   . Schizo affective schizophrenia [facility])     Patient Active Problem List   Diagnosis Date Noted  . Schizophrenia (HCC) 11/15/2019  . Schizoaffective disorder, bipolar type (HCC) 10/28/2019  . Polysubstance (including opioids) dependence, daily use (HCC)   . Substance induced mood disorder (HCC) 10/24/2019  . 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 Alcohol use disorder, severe, dependence (HCC) 08/24/2019  . 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 MDD (major depressive disorder) 03/06/2019    No past surgical history on file.     Family History  Problem Relation Age of Onset  . Stroke Other     Social  History   Tobacco Use  . Smoking status: Current Every Day Smoker    Packs/day: 1.00    Types: Cigarettes  . Smokeless tobacco: Never Used  Substance Use Topics  . Alcohol use: Yes    Comment: Drinks 3-4 times a week. Last drink: this AM  . Drug use: Yes    Frequency: 7.0 times per week    Types: "Crack" cocaine, Heroin, MDMA (Ecstacy), Marijuana    Comment: UDS not available at time of assessment    Home Medications Prior to Admission medications   Medication Sig Start Date End Date Taking? Authorizing Provider  benztropine (COGENTIN) 0.5 MG tablet Take 1 tablet (0.5 mg total) by mouth 2 (two) times daily. 11/18/19   Malvin Johns, MD  gabapentin (NEURONTIN) 300 MG capsule Take 1 capsule (300 mg total) by mouth 3 (three) times daily. 11/18/19   Malvin Johns, MD  QUEtiapine (SEROQUEL) 200 MG tablet Take 1 tablet (200 mg total) by mouth at bedtime. 11/18/19   Malvin Johns, MD  risperiDONE (RISPERDAL) 3 MG tablet Take 1 tablet (3 mg total) by mouth 2 (two) times daily. 11/18/19   Malvin Johns, MD    Allergies    Codeine  Review of Systems   Review of Systems  Constitutional: Negative for chills and fever.  HENT: Negative for congestion and facial swelling.   Eyes: Negative for discharge and visual disturbance.  Respiratory: Negative for shortness of breath.   Cardiovascular: Negative for chest pain and  palpitations.  Gastrointestinal: Positive for abdominal pain (epigastric), nausea and vomiting. Negative for diarrhea.  Musculoskeletal: Negative for arthralgias and myalgias.  Skin: Negative for color change and rash.  Neurological: Negative for tremors, syncope and headaches.  Psychiatric/Behavioral: Positive for agitation, hallucinations and suicidal ideas. Negative for confusion and dysphoric mood.    Physical Exam Updated Vital Signs BP (!) 137/96 (BP Location: Left Arm)   Pulse 72   Temp 98.4 F (36.9 C) (Oral)   Resp 16   SpO2 95%   Physical Exam Vitals and nursing  note reviewed.  Constitutional:      Appearance: He is well-developed.  HENT:     Head: Normocephalic and atraumatic.  Eyes:     Pupils: Pupils are equal, round, and reactive to light.  Neck:     Vascular: No JVD.  Cardiovascular:     Rate and Rhythm: Normal rate and regular rhythm.     Heart sounds: No murmur. No friction rub. No gallop.   Pulmonary:     Effort: No respiratory distress.     Breath sounds: No wheezing.  Abdominal:     General: There is no distension.     Tenderness: There is no abdominal tenderness. There is no guarding or rebound.     Comments: Mild epigastric pain though distractible.  Musculoskeletal:        General: Normal range of motion.     Cervical back: Normal range of motion and neck supple.  Skin:    Coloration: Skin is not pale.     Findings: No rash.  Neurological:     Mental Status: He is alert and oriented to person, place, and time.  Psychiatric:        Behavior: Behavior normal.     ED Results / Procedures / Treatments   Labs (all labs ordered are listed, but only abnormal results are displayed) Labs Reviewed  RESPIRATORY PANEL BY RT PCR (FLU A&B, COVID)  COMPREHENSIVE METABOLIC PANEL  ETHANOL  RAPID URINE DRUG SCREEN, HOSP PERFORMED  CBC WITH DIFFERENTIAL/PLATELET  ACETAMINOPHEN LEVEL  SALICYLATE LEVEL  LIPASE, BLOOD    EKG None  Radiology No results found.  Procedures Procedures (including critical care time)  Medications Ordered in ED Medications  ondansetron (ZOFRAN-ODT) disintegrating tablet 4 mg (has no administration in time range)  alum & mag hydroxide-simeth (MAALOX/MYLANTA) 200-200-20 MG/5ML suspension 30 mL (has no administration in time range)    ED Course  I have reviewed the triage vital signs and the nursing notes.  Pertinent labs & imaging results that were available during my care of the patient were reviewed by me and considered in my medical decision making (see chart for details).    MDM  Rules/Calculators/A&P                      28 yo M with a chief complaints of hallucinations and suicidal ideation and nausea vomiting and epigastric pain.  He has benign abdominal exam for me.  Will obtain abdominal labs though I do not think that imaging is warranted currently.  Will give Zofran and an oral trial.  Signed out to Dr. Gwenlyn Fudge please see his note for further details of care in the ED.  Plan will be for evaluation of labs for medical clearance and then consulting TTS.  The patients results and plan were reviewed and discussed.   Any x-rays performed were independently reviewed by myself.   Differential diagnosis were considered with the presenting HPI.  Medications  ondansetron (ZOFRAN-ODT) disintegrating tablet 4 mg (has no administration in time range)  alum & mag hydroxide-simeth (MAALOX/MYLANTA) 200-200-20 MG/5ML suspension 30 mL (has no administration in time range)    Vitals:   02/02/20 2130  BP: (!) 137/96  Pulse: 72  Resp: 16  Temp: 98.4 F (36.9 C)  TempSrc: Oral  SpO2: 95%    Final diagnoses:  Hallucinations  Drug abuse (HCC)  Epigastric abdominal pain      Final Clinical Impression(s) / ED Diagnoses Final diagnoses:  Hallucinations  Drug abuse ([place])  Epigastric abdominal pain    Rx / DC Orders ED Discharge Orders    None       Deno Etienne, DO 02/02/20 2150

## 2020-02-02 NOTE — ED Notes (Signed)
Patients clothing placed in belonging bag, labeled with stickers. Pt currently dressed in gown.

## 2020-02-02 NOTE — ED Triage Notes (Signed)
PER EMS: Pt is coming from a gas station with c/o nausea and vomiting. Pt has been vomiting continuously for the past 6 hours. States he is unable to keep anything down. PT has been hearing voices and having suicidal thoughts. Admitted to taking heroine, marijuana, crack, and alcohol intake about 7 hours ago. Hx SI, schizophrenia, bipolar disorder, and does not take any of his medications.   EMS VITALS: BP 142/90 HR 99 RR 18 SPO2 100% RA TEMP 98.7 CBG 144

## 2020-02-03 ENCOUNTER — Encounter (HOSPITAL_COMMUNITY): Payer: Self-pay | Admitting: Registered Nurse

## 2020-02-03 NOTE — BH Assessment (Signed)
BHH Assessment Progress Note  Per Shuvon Rankin, FNP, this pt does not require psychiatric hospitalization at this time.  Pt is to be discharged from [facility] with recommendation to follow up with [facility] [place].  This has been included in pt's discharge instructions.  Pt would also benefit from seeing Peer Support Specialists, and a peer support consult has been ordered for pt.  Pt's nurse, Florentina Addison, has been notified.  Doylene Canning, MA Triage Specialist [phone]
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
## 2020-02-03 NOTE — BHH Suicide Risk Assessment (Cosign Needed)
Suicide Risk Assessment  Discharge Assessment   [facility] Discharge Suicide Risk Assessment   Principal Problem: Substance induced mood disorder ([place]) Discharge Diagnoses: Principal Problem:   Substance induced mood disorder ([place]) Active Problems:   Alcohol use disorder, severe, dependence (HCC)   Schizoaffective disorder, bipolar type ([place])   Polysubstance (including opioids) dependence, daily use ([place])   Total Time spent with patient: 30 minutes  Musculoskeletal: Strength & Muscle Tone: within normal limits Gait & Station: normal Patient leans: N/A  Psychiatric Specialty Exam:   Blood pressure 115/75, pulse 72, temperature 98.4 F (36.9 C), temperature source Oral, resp. rate 15, SpO2 96 %.There is no height or weight on file to calculate BMI.  General Appearance: Casual  Eye Contact::  Good  Speech:  Clear and Coherent and Normal Rate409  Volume:  Normal  Mood:  "Tired"  Affect:  Appropriate and Congruent  Thought Process:  Coherent, Goal Directed and Descriptions of Associations: Intact  Orientation:  Full (Time, Place, and Person)  Thought Content:  WDL  Suicidal Thoughts:  No  Homicidal Thoughts:  No  Memory:  Immediate;   Good Recent;   Good Remote;   Good  Judgement:  Intact  Insight:  Present  Psychomotor Activity:  Normal  Concentration:  Good  Recall:  Good  Fund of Knowledge:Good  Language: Good  Akathisia:  No  Handed:  Right  AIMS (if indicated):     Assets:  Communication Skills Desire for Improvement Housing Social Support  Sleep:     Cognition: WNL  ADL's:  Intact   Mental Status Per Nursing Assessment::   On Admission:    Marcy Salvo, 29 y.o., male patient seen via tele psych by this provider, Dr. Dwyane Dee; and chart reviewed on 02/03/20.  On evaluation Semaje Kinker states "I'm tired of having to tell the same thing over, over, and over.  I came to the hospital last night cause I was upset that I had done illegal substances"  Would not elaborate of  what substance he had done.  Patient states that he is upset because he was given Seroquel and that is not one of his home medication.  Patient states that he wants to go to a short term rehab facility that is close by.  "I'm not [place] go out of town and have trouble getting back."  States that he lives with his girlfriend who is supportive; "doesn't do drugs or nothing."  Sates that drug use is his main problem; but also has anxiety, depression, and "events of psychosis"  Denies psychosis at this current time.  During evaluation Rook Maue is alert/oriented x 4; calm/cooperative; and mood is congruent with affect.  He does not appear to be responding to internal/external stimuli or delusional thoughts.  Patient denies suicidal/self-harm/homicidal ideation, psychosis, and paranoia.  Patient answered question appropriately.  Peer support consult ordered to assist patient with rehab services and referrals.       Demographic Factors:  Male and Caucasian  Loss Factors: NA  Historical Factors: Impulsivity  Risk Reduction Factors:   Religious beliefs about death, Living with another person, especially a relative and Positive social support  Continued Clinical Symptoms:  Alcohol/Substance Abuse/Dependencies  Cognitive Features That Contribute To Risk:  None    Suicide Risk:  Minimal: No identifiable suicidal ideation.  Patients presenting with no risk factors but with morbid ruminations; may be classified as minimal risk based on the severity of the depressive symptoms    Plan Of Care/Follow-up recommendations:  Activity:  As tolerated Diet:  Heart healthy     Discharge Instructions     For your behavioral health needs you are advised to follow up with [facility] [place].  New patients are seen at their walk-in clinic.  Walk-in hours are Monday - Friday from 8:30 am - 12:00 pm, and from 1:00 pm - 2:30 pm.  Walk-in patients are seen on a first come, first served basis, so try  to arrive as early as possible for the best chance of being seen the same day:       [facility] [place]      [address]      [place], [place] [address]      [phone]    Disposition:  Psychiatrically cleared No evidence of imminent risk to self or others at present.   Patient does not meet criteria for psychiatric inpatient admission. Supportive therapy provided about ongoing stressors. Discussed crisis plan, support from social network, calling 911, coming to the Emergency Department, and calling Suicide Hotline.  Nkenge Sonntag, NP 02/03/2020, 11:38 AM

## 2020-02-03 NOTE — Discharge Instructions (Signed)
For your behavioral health needs you are advised to follow up with [facility].  New patients are seen at their walk-in clinic.  Walk-in hours are Monday - Friday from 8:30 am - 12:00 pm, and from 1:00 pm - 2:30 pm.  Walk-in patients are seen on a first come, first served basis, so try to arrive as early as possible for the best chance of being seen the same day:       [facility]      [address]      [place], [place] [address]      [phone] 

## 2020-02-03 NOTE — Patient Outreach (Signed)
ED Peer Support Specialist Patient Intake (Complete at intake & 30-60 Day Follow-up)  Name: Mark Hammond  MRN: [id number]  Age: 29 y.o.   Date of Admission: 02/03/2020  Intake: Initial Comments:      Primary Reason Admitted: Hallucinations   Lab values: Alcohol/ETOH: Positive Positive UDS?   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 Yes  Number of ED visits:    Reason(s) for visit:    In past year, has patient been hospitalized for any reason? No  Number of hospitalizations:    Reason(s) for hospitalization:    In past year, has patient been arrested? No  Number of arrests:    Reason(s) for arrest:    In past year, has patient been incarcerated? No  Number of incarcerations:    Reason(s) for incarceration:    In past year, has patient received medication-assisted treatment? No  In past year, patient received the following treatments: Other (comment)  In past year, has patient received any harm reduction services? No  Did this include any of the following?    In past year, has patient received care from a mental health provider for diagnosis other than SUD? No  In past year, is this first time patient has overdosed? No  Number of past overdoses:    In past year, is this first time patient has been hospitalized for an overdose? No  Number of hospitalizations for overdose(s):    Is patient  currently receiving treatment for a mental health diagnosis? No  Patient reports experiencing difficulty participating in SUD treatment: No    Most important reason(s) for this difficulty?    Has patient received prior services for treatment? No  In past, patient has received services from following agencies:    Plan of Care:  Suggested follow up at these agencies/treatment centers: Other (comment)([facility])  Other information:  CPSS met with Pt an was able to gain information to better assist Pt. CPSS was made aware that Pt had been seeking help an has not been able to get into any facility. CPSS discussed a few options that Pt maybe able to benefit from there services. CPSS was made aware that Pt does not have Identification an will not be accepted because of that. CPSS made Pt aware that if he finds it when arrive at home that he still can get himself into facility after discharged from ER.    Aaron Edelman Kohl Polinsky, CPSS  02/03/2020 1:54 PM

## 2020-06-12 ENCOUNTER — Ambulatory Visit (INDEPENDENT_AMBULATORY_CARE_PROVIDER_SITE_OTHER): Payer: No Payment, Other | Admitting: Behavioral Health

## 2020-06-12 ENCOUNTER — Other Ambulatory Visit: Payer: Self-pay

## 2020-06-12 DIAGNOSIS — F250 Schizoaffective disorder, bipolar type: Secondary | ICD-10-CM

## 2020-06-18 ENCOUNTER — Encounter (HOSPITAL_COMMUNITY): Payer: No Payment, Other | Admitting: Psychiatry
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
## 2020-06-29 ENCOUNTER — Encounter (HOSPITAL_COMMUNITY): Payer: No Payment, Other | Admitting: Psychiatry

## 2020-06-29 ENCOUNTER — Other Ambulatory Visit: Payer: Self-pay

## 2020-07-02 ENCOUNTER — Encounter (HOSPITAL_COMMUNITY): Payer: Self-pay

## 2020-07-02 ENCOUNTER — Encounter (HOSPITAL_COMMUNITY): Payer: No Payment, Other | Admitting: Psychiatry
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
## 2020-07-23 ENCOUNTER — Ambulatory Visit (HOSPITAL_COMMUNITY): Payer: No Payment, Other | Admitting: Licensed Clinical Social Worker

## 2020-08-19 ENCOUNTER — Encounter (HOSPITAL_COMMUNITY): Payer: No Payment, Other | Admitting: Psychiatry

## 2021-04-16 ENCOUNTER — Emergency Department (HOSPITAL_COMMUNITY)
Admission: EM | Admit: 2021-04-16 | Discharge: 2021-04-16 | Payer: Self-pay | Attending: Emergency Medicine | Admitting: Emergency Medicine

## 2021-04-16 ENCOUNTER — Emergency Department (HOSPITAL_COMMUNITY): Payer: Self-pay

## 2021-04-16 DIAGNOSIS — R000 Tachycardia, unspecified: Secondary | ICD-10-CM | POA: Insufficient documentation

## 2021-04-16 DIAGNOSIS — S6991XA Unspecified injury of right wrist, hand and finger(s), initial encounter: Secondary | ICD-10-CM | POA: Diagnosis present

## 2021-04-16 DIAGNOSIS — F17210 Nicotine dependence, cigarettes, uncomplicated: Secondary | ICD-10-CM | POA: Diagnosis not present

## 2021-04-16 DIAGNOSIS — S51812A Laceration without foreign body of left forearm, initial encounter: Secondary | ICD-10-CM | POA: Insufficient documentation

## 2021-04-16 DIAGNOSIS — S61216A Laceration without foreign body of right little finger without damage to nail, initial encounter: Secondary | ICD-10-CM | POA: Diagnosis not present

## 2021-04-16 MED ORDER — BACITRACIN ZINC 500 UNIT/GM EX OINT
TOPICAL_OINTMENT | Freq: Once | CUTANEOUS | Status: DC
  Filled 2021-04-16: qty 0.9

## 2021-04-16 MED ORDER — LIDOCAINE-EPINEPHRINE 1 %-1:100000 IJ SOLN
10.0000 mL | Freq: Once | INTRAMUSCULAR | Status: AC
  Administered 2021-04-16: 10 mL via INTRADERMAL
  Filled 2021-04-16: qty 1
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
## 2021-06-09 ENCOUNTER — Ambulatory Visit: Payer: Self-pay | Admitting: Internal Medicine

## 2021-10-11 ENCOUNTER — Encounter (HOSPITAL_COMMUNITY): Payer: Self-pay | Admitting: *Deleted

## 2021-10-11 ENCOUNTER — Other Ambulatory Visit: Payer: Self-pay

## 2021-10-11 ENCOUNTER — Ambulatory Visit (HOSPITAL_COMMUNITY)
Admission: EM | Admit: 2021-10-11 | Discharge: 2021-10-11 | Disposition: A | Discharge status: Home / Self Care | Payer: Self-pay | Attending: Family Medicine | Admitting: Family Medicine

## 2021-10-11 DIAGNOSIS — S30812A Abrasion of penis, initial encounter: Secondary | ICD-10-CM

## 2021-10-11 MED ORDER — MUPIROCIN 2 % EX OINT: 1.0000 "application " | TOPICAL_OINTMENT | Freq: Two times a day (BID) | CUTANEOUS | 0 refills | Status: DC

## 2021-10-11 NOTE — Discharge Instructions (Addendum)
Clean area with warm soapy water gently twice daily and put new mupirocin ointment on it till it's healed.
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
## 2021-10-13 LAB — HSV CULTURE AND TYPING

## 2022-02-11 ENCOUNTER — Emergency Department (HOSPITAL_COMMUNITY): Payer: 59

## 2022-02-11 ENCOUNTER — Other Ambulatory Visit: Payer: Self-pay

## 2022-02-11 ENCOUNTER — Encounter (HOSPITAL_COMMUNITY): Payer: Self-pay

## 2022-02-11 ENCOUNTER — Emergency Department (HOSPITAL_COMMUNITY)
Admission: EM | Admit: 2022-02-11 | Discharge: 2022-02-11 | Disposition: A | Discharge status: Home / Self Care | Payer: 59 | Attending: Emergency Medicine | Admitting: Emergency Medicine

## 2022-02-11 DIAGNOSIS — S50811A Abrasion of right forearm, initial encounter: Secondary | ICD-10-CM | POA: Insufficient documentation

## 2022-02-11 DIAGNOSIS — S299XXA Unspecified injury of thorax, initial encounter: Secondary | ICD-10-CM | POA: Diagnosis present

## 2022-02-11 DIAGNOSIS — S30811A Abrasion of abdominal wall, initial encounter: Secondary | ICD-10-CM | POA: Diagnosis not present

## 2022-02-11 DIAGNOSIS — S0003XA Contusion of scalp, initial encounter: Secondary | ICD-10-CM | POA: Diagnosis not present

## 2022-02-11 DIAGNOSIS — K2920 Alcoholic gastritis without bleeding: Secondary | ICD-10-CM | POA: Insufficient documentation

## 2022-02-11 DIAGNOSIS — R000 Tachycardia, unspecified: Secondary | ICD-10-CM | POA: Insufficient documentation

## 2022-02-11 DIAGNOSIS — F1010 Alcohol abuse, uncomplicated: Secondary | ICD-10-CM | POA: Insufficient documentation

## 2022-02-11 DIAGNOSIS — F1090 Alcohol use, unspecified, uncomplicated: Secondary | ICD-10-CM

## 2022-02-11 DIAGNOSIS — S80212A Abrasion, left knee, initial encounter: Secondary | ICD-10-CM | POA: Insufficient documentation

## 2022-02-11 DIAGNOSIS — Y906 Blood alcohol level of 120-199 mg/100 ml: Secondary | ICD-10-CM | POA: Diagnosis not present

## 2022-02-11 DIAGNOSIS — S2231XA Fracture of one rib, right side, initial encounter for closed fracture: Secondary | ICD-10-CM | POA: Insufficient documentation

## 2022-02-11 DIAGNOSIS — M542 Cervicalgia: Secondary | ICD-10-CM | POA: Diagnosis not present

## 2022-02-11 DIAGNOSIS — R7401 Elevation of levels of liver transaminase levels: Secondary | ICD-10-CM | POA: Insufficient documentation

## 2022-02-11 LAB — CBC WITH DIFFERENTIAL/PLATELET
Abs Immature Granulocytes: 0.03 10*3/uL (ref 0.00–0.07)
Basophils Absolute: 0 10*3/uL (ref 0.0–0.1)
Basophils Relative: 0 %
Eosinophils Absolute: 0 10*3/uL (ref 0.0–0.5)
Eosinophils Relative: 0 %
HCT: 46.2 % (ref 39.0–52.0)
Hemoglobin: 16.6 g/dL (ref 13.0–17.0)
Immature Granulocytes: 0 %
Lymphocytes Relative: 18 %
Lymphs Abs: 2 10*3/uL (ref 0.7–4.0)
MCH: 33.8 pg (ref 26.0–34.0)
MCHC: 35.9 g/dL (ref 30.0–36.0)
MCV: 94.1 fL (ref 80.0–100.0)
Monocytes Absolute: 0.9 10*3/uL (ref 0.1–1.0)
Monocytes Relative: 8 %
Neutro Abs: 8.3 10*3/uL — ABNORMAL HIGH (ref 1.7–7.7)
Neutrophils Relative %: 74 %
Platelets: 190 10*3/uL (ref 150–400)
RBC: 4.91 MIL/uL (ref 4.22–5.81)
RDW: 13.2 % (ref 11.5–15.5)
WBC: 11.3 10*3/uL — ABNORMAL HIGH (ref 4.0–10.5)
nRBC: 0 % (ref 0.0–0.2)

## 2022-02-11 LAB — COMPREHENSIVE METABOLIC PANEL
ALT: 385 U/L — ABNORMAL HIGH (ref 0–44)
AST: 410 U/L — ABNORMAL HIGH (ref 15–41)
Albumin: 4 g/dL (ref 3.5–5.0)
Alkaline Phosphatase: 95 U/L (ref 38–126)
Anion gap: 10 (ref 5–15)
BUN: 6 mg/dL (ref 6–20)
CO2: 22 mmol/L (ref 22–32)
Calcium: 8.6 mg/dL — ABNORMAL LOW (ref 8.9–10.3)
Chloride: 108 mmol/L (ref 98–111)
Creatinine, Ser: 0.84 mg/dL (ref 0.61–1.24)
GFR, Estimated: >60 mL/min (ref >60)
Glucose, Bld: 97 mg/dL (ref 70–99)
Potassium: 3.5 mmol/L (ref 3.5–5.1)
Sodium: 140 mmol/L (ref 135–145)
Total Bilirubin: 1.2 mg/dL (ref 0.3–1.2)
Total Protein: 8.2 g/dL — ABNORMAL HIGH (ref 6.5–8.1)

## 2022-02-11 LAB — SALICYLATE LEVEL: Salicylate Lvl: <7 mg/dL — ABNORMAL LOW (ref 7.0–30.0)

## 2022-02-11 LAB — ETHANOL: Alcohol, Ethyl (B): 198 mg/dL — ABNORMAL HIGH (ref <10)

## 2022-02-11 LAB — ACETAMINOPHEN LEVEL: Acetaminophen (Tylenol), Serum: <10 ug/mL — ABNORMAL LOW (ref 10–30)

## 2022-02-11 LAB — LIPASE, BLOOD: Lipase: 31 U/L (ref 11–51)

## 2022-02-11 MED ORDER — FAMOTIDINE 20 MG PO TABS: 20.0000 mg | ORAL_TABLET | Freq: Two times a day (BID) | ORAL | 0 refills | Status: DC

## 2022-02-11 MED ORDER — KETOROLAC TROMETHAMINE 15 MG/ML IJ SOLN
15.0000 mg | Freq: Once | INTRAMUSCULAR | Status: AC
  Administered 2022-02-11: 15 mg via INTRAVENOUS
  Filled 2022-02-11: qty 1

## 2022-02-11 MED ORDER — SODIUM CHLORIDE (PF) 0.9 % IJ SOLN
INTRAMUSCULAR | Status: AC
  Filled 2022-02-11: qty 50

## 2022-02-11 MED ORDER — IOHEXOL 300 MG/ML  SOLN
100.0000 mL | Freq: Once | INTRAMUSCULAR | Status: AC | PRN
  Administered 2022-02-11: 100 mL via INTRAVENOUS

## 2022-02-11 MED ORDER — ONDANSETRON HCL 4 MG PO TABS: 4.0000 mg | ORAL_TABLET | Freq: Four times a day (QID) | ORAL | 0 refills | Status: DC
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
## 2022-02-11 NOTE — ED Notes (Signed)
Patient asking to smoke a cigarette. Patient told that he will not be able to do that or he can be discharged. Patient said "Yall fuc*ing better not discharge me like this, my head is open."  ?

## 2022-02-11 NOTE — Discharge Instructions (Addendum)
You will need to follow up with your family doctor in the next week to have your liver enzymes rechecked.   ?
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
## 2023-01-13 ENCOUNTER — Other Ambulatory Visit: Payer: Self-pay

## 2023-01-13 ENCOUNTER — Inpatient Hospital Stay (HOSPITAL_COMMUNITY)
Admission: AD | Admit: 2023-01-13 | Discharge: 2023-01-23 | DRG: 885 | Disposition: A | Discharge status: Home / Self Care | Payer: 59 | Source: Intra-hospital | Attending: Psychiatry | Admitting: Psychiatry

## 2023-01-13 ENCOUNTER — Encounter (HOSPITAL_COMMUNITY): Payer: Self-pay

## 2023-01-13 ENCOUNTER — Emergency Department (HOSPITAL_COMMUNITY)
Admission: EM | Admit: 2023-01-13 | Discharge: 2023-01-13 | Disposition: A | Discharge status: Other Acute Inpatient Hospital | Payer: 59 | Attending: Emergency Medicine | Admitting: Emergency Medicine

## 2023-01-13 DIAGNOSIS — S30811A Abrasion of abdominal wall, initial encounter: Secondary | ICD-10-CM | POA: Diagnosis not present

## 2023-01-13 DIAGNOSIS — Z5941 Food insecurity: Secondary | ICD-10-CM

## 2023-01-13 DIAGNOSIS — S20319A Abrasion of unspecified front wall of thorax, initial encounter: Secondary | ICD-10-CM | POA: Diagnosis not present

## 2023-01-13 DIAGNOSIS — Z9152 Personal history of nonsuicidal self-harm: Secondary | ICD-10-CM | POA: Diagnosis not present

## 2023-01-13 DIAGNOSIS — F200 Paranoid schizophrenia: Secondary | ICD-10-CM | POA: Diagnosis not present

## 2023-01-13 DIAGNOSIS — Z5982 Transportation insecurity: Secondary | ICD-10-CM

## 2023-01-13 DIAGNOSIS — Z87828 Personal history of other (healed) physical injury and trauma: Secondary | ICD-10-CM | POA: Diagnosis not present

## 2023-01-13 DIAGNOSIS — F1520 Other stimulant dependence, uncomplicated: Secondary | ICD-10-CM | POA: Diagnosis present

## 2023-01-13 DIAGNOSIS — F1910 Other psychoactive substance abuse, uncomplicated: Secondary | ICD-10-CM | POA: Diagnosis not present

## 2023-01-13 DIAGNOSIS — F17210 Nicotine dependence, cigarettes, uncomplicated: Secondary | ICD-10-CM | POA: Diagnosis present

## 2023-01-13 DIAGNOSIS — F1920 Other psychoactive substance dependence, uncomplicated: Secondary | ICD-10-CM | POA: Diagnosis present

## 2023-01-13 DIAGNOSIS — F1924 Other psychoactive substance dependence with psychoactive substance-induced mood disorder: Secondary | ICD-10-CM | POA: Insufficient documentation

## 2023-01-13 DIAGNOSIS — G4700 Insomnia, unspecified: Secondary | ICD-10-CM | POA: Diagnosis present

## 2023-01-13 DIAGNOSIS — F1994 Other psychoactive substance use, unspecified with psychoactive substance-induced mood disorder: Secondary | ICD-10-CM | POA: Diagnosis present

## 2023-01-13 DIAGNOSIS — S299XXA Unspecified injury of thorax, initial encounter: Secondary | ICD-10-CM | POA: Diagnosis not present

## 2023-01-13 DIAGNOSIS — F319 Bipolar disorder, unspecified: Secondary | ICD-10-CM | POA: Diagnosis not present

## 2023-01-13 DIAGNOSIS — Z20822 Contact with and (suspected) exposure to covid-19: Secondary | ICD-10-CM | POA: Diagnosis not present

## 2023-01-13 DIAGNOSIS — Z79899 Other long term (current) drug therapy: Secondary | ICD-10-CM

## 2023-01-13 DIAGNOSIS — F258 Other schizoaffective disorders: Secondary | ICD-10-CM | POA: Diagnosis not present

## 2023-01-13 DIAGNOSIS — F17290 Nicotine dependence, other tobacco product, uncomplicated: Secondary | ICD-10-CM | POA: Diagnosis present

## 2023-01-13 DIAGNOSIS — F1020 Alcohol dependence, uncomplicated: Secondary | ICD-10-CM | POA: Diagnosis not present

## 2023-01-13 DIAGNOSIS — F1410 Cocaine abuse, uncomplicated: Secondary | ICD-10-CM | POA: Diagnosis not present

## 2023-01-13 DIAGNOSIS — Z5900 Homelessness unspecified: Secondary | ICD-10-CM | POA: Insufficient documentation

## 2023-01-13 DIAGNOSIS — R45850 Homicidal ideations: Secondary | ICD-10-CM | POA: Diagnosis not present

## 2023-01-13 DIAGNOSIS — F259 Schizoaffective disorder, unspecified: Secondary | ICD-10-CM | POA: Diagnosis not present

## 2023-01-13 DIAGNOSIS — F411 Generalized anxiety disorder: Secondary | ICD-10-CM | POA: Diagnosis present

## 2023-01-13 DIAGNOSIS — F1120 Opioid dependence, uncomplicated: Secondary | ICD-10-CM | POA: Diagnosis present

## 2023-01-13 DIAGNOSIS — F250 Schizoaffective disorder, bipolar type: Secondary | ICD-10-CM | POA: Diagnosis not present

## 2023-01-13 DIAGNOSIS — Z91199 Patient's noncompliance with other medical treatment and regimen due to unspecified reason: Secondary | ICD-10-CM | POA: Diagnosis not present

## 2023-01-13 DIAGNOSIS — R45851 Suicidal ideations: Secondary | ICD-10-CM | POA: Insufficient documentation

## 2023-01-13 DIAGNOSIS — F1419 Cocaine abuse with unspecified cocaine-induced disorder: Secondary | ICD-10-CM | POA: Diagnosis present

## 2023-01-13 DIAGNOSIS — Z885 Allergy status to narcotic agent status: Secondary | ICD-10-CM

## 2023-01-13 DIAGNOSIS — F2089 Other schizophrenia: Secondary | ICD-10-CM

## 2023-01-13 DIAGNOSIS — R9431 Abnormal electrocardiogram [ECG] [EKG]: Secondary | ICD-10-CM | POA: Diagnosis not present

## 2023-01-13 DIAGNOSIS — F209 Schizophrenia, unspecified: Secondary | ICD-10-CM | POA: Diagnosis not present

## 2023-01-13 DIAGNOSIS — Y906 Blood alcohol level of 120-199 mg/100 ml: Secondary | ICD-10-CM | POA: Diagnosis not present

## 2023-01-13 DIAGNOSIS — E559 Vitamin D deficiency, unspecified: Secondary | ICD-10-CM | POA: Diagnosis present

## 2023-01-13 DIAGNOSIS — X789XXA Intentional self-harm by unspecified sharp object, initial encounter: Secondary | ICD-10-CM | POA: Diagnosis not present

## 2023-01-13 DIAGNOSIS — F1310 Sedative, hypnotic or anxiolytic abuse, uncomplicated: Secondary | ICD-10-CM | POA: Diagnosis present

## 2023-01-13 HISTORY — DX: Other psychoactive substance abuse, uncomplicated: F19.10

## 2023-01-13 HISTORY — DX: Alcohol abuse, uncomplicated: F10.10

## 2023-01-13 HISTORY — DX: Alcoholic gastritis without bleeding: K29.20

## 2023-01-13 HISTORY — DX: Hallucinations, unspecified: R44.3

## 2023-01-13 LAB — CBC WITH DIFFERENTIAL/PLATELET
Abs Immature Granulocytes: 0.03 10*3/uL (ref 0.00–0.07)
Basophils Absolute: 0.1 10*3/uL (ref 0.0–0.1)
Basophils Relative: 1 %
Eosinophils Absolute: 0.1 10*3/uL (ref 0.0–0.5)
Eosinophils Relative: 1 %
HCT: 44 % (ref 39.0–52.0)
Hemoglobin: 15.7 g/dL (ref 13.0–17.0)
Immature Granulocytes: 0 %
Lymphocytes Relative: 35 %
Lymphs Abs: 3.1 10*3/uL (ref 0.7–4.0)
MCH: 32.8 pg (ref 26.0–34.0)
MCHC: 35.7 g/dL (ref 30.0–36.0)
MCV: 91.9 fL (ref 80.0–100.0)
Monocytes Absolute: 0.7 10*3/uL (ref 0.1–1.0)
Monocytes Relative: 8 %
Neutro Abs: 4.8 10*3/uL (ref 1.7–7.7)
Neutrophils Relative %: 55 %
Platelets: 239 10*3/uL (ref 150–400)
RBC: 4.79 MIL/uL (ref 4.22–5.81)
RDW: 12.5 % (ref 11.5–15.5)
WBC: 8.7 10*3/uL (ref 4.0–10.5)
nRBC: 0 % (ref 0.0–0.2)

## 2023-01-13 LAB — COMPREHENSIVE METABOLIC PANEL
ALT: 247 U/L — ABNORMAL HIGH (ref 0–44)
AST: 405 U/L — ABNORMAL HIGH (ref 15–41)
Albumin: 4.1 g/dL (ref 3.5–5.0)
Alkaline Phosphatase: 120 U/L (ref 38–126)
Anion gap: 11 (ref 5–15)
BUN: 8 mg/dL (ref 6–20)
CO2: 24 mmol/L (ref 22–32)
Calcium: 8.3 mg/dL — ABNORMAL LOW (ref 8.9–10.3)
Chloride: 101 mmol/L (ref 98–111)
Creatinine, Ser: 0.66 mg/dL (ref 0.61–1.24)
GFR, Estimated: >60 mL/min (ref >60)
Glucose, Bld: 130 mg/dL — ABNORMAL HIGH (ref 70–99)
Potassium: 3.2 mmol/L — ABNORMAL LOW (ref 3.5–5.1)
Sodium: 136 mmol/L (ref 135–145)
Total Bilirubin: 1 mg/dL (ref 0.3–1.2)
Total Protein: 8.2 g/dL — ABNORMAL HIGH (ref 6.5–8.1)

## 2023-01-13 LAB — RAPID URINE DRUG SCREEN, HOSP PERFORMED
Amphetamines: POSITIVE — AB
Barbiturates: NOT DETECTED
Benzodiazepines: NOT DETECTED
Cocaine: POSITIVE — AB
Opiates: NOT DETECTED
Tetrahydrocannabinol: POSITIVE — AB

## 2023-01-13 LAB — LIPID PANEL
Cholesterol: 138 mg/dL (ref 0–200)
HDL: 63 mg/dL (ref >40)
LDL Cholesterol: 58 mg/dL (ref 0–99)
Total CHOL/HDL Ratio: 2.2 RATIO
Triglycerides: 84 mg/dL (ref <150)
VLDL: 17 mg/dL (ref 0–40)

## 2023-01-13 LAB — HEMOGLOBIN A1C
Hgb A1c MFr Bld: 5.5 % (ref 4.8–5.6)
Mean Plasma Glucose: 111.15 mg/dL

## 2023-01-13 LAB — TSH: TSH: 0.745 u[IU]/mL (ref 0.350–4.500)

## 2023-01-13 LAB — SARS CORONAVIRUS 2 BY RT PCR: SARS Coronavirus 2 by RT PCR: NEGATIVE

## 2023-01-13 LAB — ETHANOL: Alcohol, Ethyl (B): 163 mg/dL — ABNORMAL HIGH (ref <10)

## 2023-01-13 MED ORDER — LORAZEPAM 1 MG PO TABS: 1.0000 mg | ORAL_TABLET | Freq: Four times a day (QID) | ORAL | Status: DC | PRN

## 2023-01-13 MED ORDER — ONDANSETRON 4 MG PO TBDP: 4.0000 mg | ORAL_TABLET | Freq: Four times a day (QID) | ORAL | Status: DC | PRN

## 2023-01-13 MED ORDER — LORAZEPAM 1 MG PO TABS
2.0000 mg | ORAL_TABLET | Freq: Three times a day (TID) | ORAL | Status: DC | PRN
  Administered 2023-01-18: 2 mg via ORAL
  Filled 2023-01-13: qty 2

## 2023-01-13 MED ORDER — ADULT MULTIVITAMIN W/MINERALS CH
1.0000 | ORAL_TABLET | Freq: Every day | ORAL | Status: DC
  Administered 2023-01-13: 1 via ORAL
  Filled 2023-01-13: qty 1

## 2023-01-13 MED ORDER — HYDROXYZINE HCL 25 MG PO TABS
25.0000 mg | ORAL_TABLET | Freq: Three times a day (TID) | ORAL | Status: DC | PRN
  Administered 2023-01-13 – 2023-01-22 (×4): 25 mg via ORAL
  Filled 2023-01-13 (×4): qty 1

## 2023-01-13 MED ORDER — ONDANSETRON 4 MG PO TBDP
4.0000 mg | ORAL_TABLET | Freq: Four times a day (QID) | ORAL | Status: AC | PRN
  Administered 2023-01-14: 4 mg via ORAL
  Filled 2023-01-13: qty 1

## 2023-01-13 MED ORDER — LORAZEPAM 2 MG/ML IJ SOLN: 2.0000 mg | Freq: Three times a day (TID) | INTRAMUSCULAR | Status: DC | PRN

## 2023-01-13 MED ORDER — LOPERAMIDE HCL 2 MG PO CAPS: 2.0000 mg | ORAL_CAPSULE | ORAL | Status: DC | PRN

## 2023-01-13 MED ORDER — ALUM & MAG HYDROXIDE-SIMETH 200-200-20 MG/5ML PO SUSP: 30.0000 mL | ORAL | Status: DC | PRN

## 2023-01-13 MED ORDER — DIPHENHYDRAMINE HCL 50 MG/ML IJ SOLN: 50.0000 mg | Freq: Three times a day (TID) | INTRAMUSCULAR | Status: DC | PRN

## 2023-01-13 MED ORDER — LORAZEPAM 1 MG PO TABS: 1.0000 mg | ORAL_TABLET | ORAL | Status: DC | PRN

## 2023-01-13 MED ORDER — OLANZAPINE 10 MG PO TBDP
10.0000 mg | ORAL_TABLET | Freq: Every day | ORAL | Status: DC
  Administered 2023-01-13: 10 mg via ORAL
  Filled 2023-01-13: qty 1

## 2023-01-13 MED ORDER — MAGNESIUM HYDROXIDE 400 MG/5ML PO SUSP
30.0000 mL | Freq: Every day | ORAL | Status: DC | PRN
  Administered 2023-01-20: 30 mL via ORAL
  Filled 2023-01-13: qty 30

## 2023-01-13 MED ORDER — ACETAMINOPHEN 325 MG PO TABS
650.0000 mg | ORAL_TABLET | Freq: Four times a day (QID) | ORAL | Status: DC | PRN
  Administered 2023-01-15 – 2023-01-22 (×5): 650 mg via ORAL
  Filled 2023-01-13 (×5): qty 2

## 2023-01-13 MED ORDER — VITAMIN B-1 100 MG PO TABS
100.0000 mg | ORAL_TABLET | Freq: Every day | ORAL | Status: DC
  Administered 2023-01-14 – 2023-01-23 (×10): 100 mg via ORAL
  Filled 2023-01-13 (×13): qty 1

## 2023-01-13 MED ORDER — DIPHENHYDRAMINE HCL 25 MG PO CAPS
50.0000 mg | ORAL_CAPSULE | Freq: Three times a day (TID) | ORAL | Status: DC | PRN
  Administered 2023-01-16 – 2023-01-18 (×2): 50 mg via ORAL
  Filled 2023-01-13 (×2): qty 2

## 2023-01-13 MED ORDER — RISPERIDONE 0.5 MG PO TBDP
2.0000 mg | ORAL_TABLET | Freq: Three times a day (TID) | ORAL | Status: DC | PRN
Start: 2023-01-13 — End: 2023-01-13

## 2023-01-13 MED ORDER — THIAMINE MONONITRATE 100 MG PO TABS: 100.0000 mg | ORAL_TABLET | Freq: Every day | ORAL | Status: DC

## 2023-01-13 MED ORDER — ADULT MULTIVITAMIN W/MINERALS CH
1.0000 | ORAL_TABLET | Freq: Every day | ORAL | Status: DC
  Administered 2023-01-14 – 2023-01-23 (×10): 1 via ORAL
  Filled 2023-01-13 (×13): qty 1

## 2023-01-13 MED ORDER — HYDROXYZINE HCL 25 MG PO TABS: 25.0000 mg | ORAL_TABLET | Freq: Four times a day (QID) | ORAL | Status: DC | PRN

## 2023-01-13 MED ORDER — OLANZAPINE 10 MG PO TBDP
10.0000 mg | ORAL_TABLET | Freq: Every day | ORAL | Status: DC
  Administered 2023-01-13 – 2023-01-14 (×2): 10 mg via ORAL
  Filled 2023-01-13 (×6): qty 1

## 2023-01-13 MED ORDER — HALOPERIDOL LACTATE 5 MG/ML IJ SOLN: 5.0000 mg | Freq: Three times a day (TID) | INTRAMUSCULAR | Status: DC | PRN

## 2023-01-13 MED ORDER — TRAZODONE HCL 50 MG PO TABS
50.0000 mg | ORAL_TABLET | Freq: Every evening | ORAL | Status: DC | PRN
  Administered 2023-01-13: 50 mg via ORAL
  Filled 2023-01-13: qty 1

## 2023-01-13 MED ORDER — LOPERAMIDE HCL 2 MG PO CAPS: 2.0000 mg | ORAL_CAPSULE | ORAL | Status: AC | PRN

## 2023-01-13 MED ORDER — HALOPERIDOL 5 MG PO TABS
5.0000 mg | ORAL_TABLET | Freq: Three times a day (TID) | ORAL | Status: DC | PRN
  Administered 2023-01-16 – 2023-01-18 (×2): 5 mg via ORAL
  Filled 2023-01-13 (×2): qty 1

## 2023-01-13 MED ORDER — ZIPRASIDONE MESYLATE 20 MG IM SOLR: 20.0000 mg | INTRAMUSCULAR | Status: DC | PRN

## 2023-01-13 MED ORDER — THIAMINE HCL 100 MG/ML IJ SOLN
100.0000 mg | Freq: Once | INTRAMUSCULAR | Status: AC
  Administered 2023-01-13: 100 mg via INTRAMUSCULAR
  Filled 2023-01-13: qty 2

## 2023-01-13 NOTE — Progress Notes (Signed)
Patient ID: Mark Hammond, male   DOB: May 25, 1991, 32 y.o.   MRN: [id number]   Initial Treatment Plan 01/13/2023 6:28 PM [name] TJQ:[id number]    PATIENT STRESSORS: Financial difficulties   Loss of relationship/significant other   Marital or family conflict   Medication change or noncompliance   Substance abuse     PATIENT STRENGTHS: Capable of independent living  Communication skills  General fund of knowledge  Motivation for treatment/growth  Religious Affiliation    PATIENT IDENTIFIED PROBLEMS: Substance abuse  Depression  Suicidal ideation  Self harm behaviors/thoughts               DISCHARGE CRITERIA:  Improved stabilization in mood, thinking, and/or behavior Reduction of life-threatening or endangering symptoms to within safe limits Verbal commitment to aftercare and medication compliance Withdrawal symptoms are absent or subacute and managed without 24-hour nursing intervention  PRELIMINARY DISCHARGE PLAN: Attend aftercare/continuing care group Attend 12-step recovery group Outpatient therapy Return to previous living arrangement  PATIENT/FAMILY INVOLVEMENT: This treatment plan has been presented to and reviewed with the patient, [name], and/or family member.  The patient and family have been given the opportunity to ask questions and make suggestions.  Tania Ade, RN 01/13/2023, 6:28 PM

## 2023-01-13 NOTE — ED Notes (Signed)
Patient off unit to [facility] per provider. Patient alert, calm, cooperative, no s/s of distress. Patient discharge information given to [facility] for facility  Patient ambulatory off unit, escorted by RN. Patient transported by [employer]
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
## 2023-01-13 NOTE — Plan of Care (Signed)
  Problem: Activity: Goal: Will verbalize the importance of balancing activity with adequate rest periods Outcome: Progressing   Problem: Education: Goal: Will be free of psychotic symptoms Outcome: Progressing Goal: Knowledge of the prescribed therapeutic regimen will improve Outcome: Progressing   Problem: Coping: Goal: Will verbalize feelings Outcome: Progressing

## 2023-01-13 NOTE — Progress Notes (Signed)
LCSW Progress Note  [id number]   Javoni Desantos  01/13/2023  11:06 AM  Description:   Inpatient Psychiatric Referral  Patient was recommended inpatient per [facility], PMHNP. There are no available beds at [facility] Patient was referred to the following facilities:   Destination  Service Provider Address Phone Fax  [facility]  [address], [place] [address] [phone]  [facility]  [address] [place] [address] [phone]  [facility] [address]  [address], [place] [address] [phone]  [facility]  [address] [place] [address] [phone]  [facility]  [address] [place] [address] [phone]  [facility]  [phone] [place] [address] [phone]  [facility]  [address] [place], [place] [address] [phone]  [facility]  [address] [place] [address] [phone]  [facility]  [address], [place] [address] [phone]  [facility]  [address] [place] [address] [phone]  [facility]  [address], [place] [address] [phone]  [facility]  [address], [place] [address] [phone]  [facility]  [address] [place] [address] [phone]  [facility]  [address], [place] [address] [phone]  [facility]  [address], [place] [address] [phone]  [facility]  [address] [place] [address] [phone]   [facility]  [address] [place] [address] [phone]  [facility]  [address] [place] [address] [phone]  [facility]  [address] [place] [address] [phone]  [facility]  [address], [place] [address] [phone]  [facility]  [address] [place], [place] [address] [phone]  [facility]  [address], [place] [address] [phone]  [facility]  [address] [place], [place] [address] [phone]  [facility]  [address] [place] [address] [phone]  [facility]  [address] [place] [address] [phone]    Situation ongoing, CSW to continue following and update chart as more information becomes available.      Cathie Beams, [place]  01/13/2023 11:06 AM

## 2023-01-13 NOTE — ED Notes (Signed)
Pt changed out into burgundy scrubs. Wanded by security. One belongings bag with clothing and a blanket in cabinet behind 9-12 station.

## 2023-01-13 NOTE — Group Note (Signed)
Date:  01/13/2023 Time:  9:06 PM  Group Topic/Focus:  Wrap-Up Group:   The focus of this group is to help patients review their daily goal of treatment and discuss progress on daily workbooks.    Participation Level:  Did Not Attend   Mark Hammond 01/13/2023, 9:06 PM  

## 2023-01-13 NOTE — Progress Notes (Signed)
Pt was accepted to [facility] TODAY 01/13/2023. Bed assignment: 507-2  Pt meets inpatient criteria per Alona Bene, PMHNP  Attending Physician will be Phineas Inches, MD  Report can be called to: - Adult unit: [phone]  Pt can arrive after 1630  Care Team Notified: Alona Bene, PMHNP, [facility] Rosey Bath, RN, Lum Babe, RN, [place], [place], and Dossie Arbour, RN  [place], [place]  01/13/2023 4:08 PM

## 2023-01-13 NOTE — ED Provider Notes (Signed)
[place] EM[facility]GENC[facility] DEPARTMENT AT [facility] Provider Note   CSN: [id number] Arrival date & time: 01/13/23  0335     History  Chief Complaint  Patient presents with   Mental Health Problem    Mark Hammond is a 32 y.o. male.  HPI     This is a 32 year old male who presents with police with concerns for SI and schizophrenia.  He was found after receiving a phone call that he was drinking water off of the street.  He is newly homeless.  He reports ongoing hallucinations.  He also reports that he feels like he may hurt himself.  He does not have a plan and is vague but states that if he had the opportunity he would do it.  He reports cocaine abuse and alcohol abuse.  Denies any drinking tonight.  Reports history of self injury.  Has multiple cuts across the abdomen.  Home Medications Prior to Admission medications   Medication Sig Start Date End Date Taking? Authorizing Provider  famotidine (PEPCID) 20 MG tablet Take 1 tablet (20 mg total) by mouth 2 (two) times daily. 02/11/22   Tilden Fossa, MD  ondansetron (ZOFRAN) 4 MG tablet Take 1 tablet (4 mg total) by mouth every 6 (six) hours. 02/11/22   Tilden Fossa, MD      Allergies    Codeine    Review of Systems   Review of Systems  Psychiatric/Behavioral:  Positive for hallucinations, self-injury and suicidal ideas.   All other systems reviewed and are negative.   Physical Exam Updated Vital Signs BP 138/88 (BP Location: Right Arm)   Pulse (!) 110   Temp 98.1 F (36.7 C) (Oral)   Resp (!) 22   Ht 1.651 m (5\' 5" )   Wt 59 kg   SpO2 99%   BMI 21.63 kg/m  Physical Exam Vitals and nursing note reviewed.  Constitutional:      Appearance: He is well-developed.     Comments: Agitated but cooperative, tearful  HENT:     Head: Normocephalic and atraumatic.  Eyes:     Pupils: Pupils are equal, round, and reactive to light.  Cardiovascular:     Rate and Rhythm: Normal rate and regular rhythm.  Pulmonary:      Effort: Pulmonary effort is normal. No respiratory distress.  Abdominal:     Palpations: Abdomen is soft.  Musculoskeletal:     Cervical back: Neck supple.  Lymphadenopathy:     Cervical: No cervical adenopathy.  Skin:    General: Skin is warm and dry.     Comments: Multiple abrasions over the chest and abdomen including intentional numeric and alphabetical abbreviations  Neurological:     Mental Status: He is alert and oriented to person, place, and time.  Psychiatric:     Comments: Tearful, labile     ED Results / Procedures / Treatments   Labs (all labs ordered are listed, but only abnormal results are displayed) Labs Reviewed  COMPREHENSIVE METABOLIC PANEL - Abnormal; Notable for the following components:      Result Value   Potassium 3.2 (*)    Glucose, Bld 130 (*)    Calcium 8.3 (*)    Total Protein 8.2 (*)    AST 405 (*)    ALT 247 (*)    All other components within normal limits  ETHANOL - Abnormal; Notable for the following components:   Alcohol, Ethyl (B) 163 (*)    All other components within normal limits  RAPID  URINE DRUG SCREEN, HOSP PERFORMED - Abnormal; Notable for the following components:   Cocaine POSITIVE (*)    Amphetamines POSITIVE (*)    Tetrahydrocannabinol POSITIVE (*)    All other components within normal limits  CBC WITH DIFFERENTIAL/PLATELET    EKG None  Radiology No results found.  Procedures Procedures    Medications Ordered in ED Medications  OLANZapine zydis (ZYPREXA) disintegrating tablet 10 mg (has no administration in time range)    ED Course/ Medical Decision Making/ A&P                             Medical Decision Making Amount and/or Complexity of Data Reviewed Labs: ordered.  Risk Prescription drug management.   This patient presents to the ED for concern of multiple complaints, SI, polysubstance abuse, schizophrenia, this involves an extensive number of treatment options, and is a complaint that carries with  it a high risk of complications and morbidity.  I considered the following differential and admission for this acute, potentially life threatening condition.  The differential diagnosis includes acute psychosis, unmanaged schizophrenia, SI, polysubstance induced mood disorder, acute intoxication  MDM:    This is a 32 year old male who presents with SI, polysubstance abuse, hallucinations.  He is agitated appearing but cooperative.  He is here voluntarily.  Is very tearful.  Recently homeless.  Reports SI without a concrete plan.  Reports polysubstance abuse and is no longer on his psychiatric medications.  Patient was given a dose of Zyprexa.  Labs obtained and reviewed.  Positive for cocaine, marijuana, methamphetamines.  EtOH is also positive.  Otherwise patient is medically cleared for TTS evaluation.  (Labs, imaging, consults)  Labs: I Ordered, and personally interpreted labs.  The pertinent results include: CBC, BMP, UDS, EtOH  Imaging Studies ordered: I ordered imaging studies including N/A I independently visualized and interpreted imaging. I agree with the radiologist interpretation  Additional history obtained from chart review.  External records from outside source obtained and reviewed including prior evaluations  Cardiac Monitoring: The patient was not maintained on a cardiac monitor.  If on the cardiac monitor, I personally viewed and interpreted the cardiac monitored which showed an underlying rhythm of: N/A  Reevaluation: After the interventions noted above, I reevaluated the patient and found that they have :stayed the same  Social Determinants of Health:  homeless  Disposition: TTS evaluation pending  Co morbidities that complicate the patient evaluation  Past Medical History:  Diagnosis Date   Alcoholic gastritis    Bipolar 1 disorder    Dental abscess    Depression    Drug abuse    ETOH abuse    Hallucination    Schizo affective schizophrenia       Medicines Meds ordered this encounter  Medications   OLANZapine zydis (ZYPREXA) disintegrating tablet 10 mg    I have reviewed the patients home medicines and have made adjustments as needed  Problem List / ED Course: Problem List Items Addressed This Visit       Other   Schizophrenia - Primary   Other Visit Diagnoses     Polysubstance abuse       Suicidal ideation                       Final Clinical Impression(s) / ED Diagnoses Final diagnoses:  Other schizophrenia  Polysubstance abuse  Suicidal ideation    Rx / DC Orders ED Discharge Orders  None         Shon Baton, MD 01/13/23 [id number]

## 2023-01-13 NOTE — ED Notes (Signed)
TTS in progress 

## 2023-01-13 NOTE — ED Triage Notes (Signed)
Pt brought in by [employer] after bystanders called in for concern as pt was seen drinking water from the street. [employer] reports pt seems paranoid and emotionally upset. Pt reports SI thoughts, but no plan to act on. Pt has self inflicted cuts on his abd and arms in healing stages. Pt reports hearing voices that are telling him that he needs help. Pt has drank alcohol tonight and using crack-cocaine. Pt reports newly homeless within the past 2 days, pt clothes dirty and no shoes with pt. Pt is visually upset and crying.

## 2023-01-13 NOTE — Group Note (Signed)
Date:  01/13/2023 Time:  6:54 PM  Group Topic/Focus:  Recovery Goals:   The focus of this group is to identify appropriate goals for recovery and establish a plan to achieve them.    Participation Level:  Did Not Attend group because he had not been admitted.at the time of the group.    Enza Shone W Lawyer [place] 01/13/2023, 6:54 PM

## 2023-01-13 NOTE — ED Notes (Signed)
Patient to room 29. Patient ambulated to room . Patient alert and cooperative. Patient oriented to unit and room.

## 2023-01-13 NOTE — Consult Note (Signed)
[facility] ED ASSESSMENT   Reason for Consult:   Referring Physician:   Patient Identification: Mark Hammond MRN:  [id number] ED Chief Complaint: Schizoaffective disorder, bipolar type  Diagnosis:  Principal Problem:   Schizoaffective disorder, bipolar type Active Problems:   Substance induced mood disorder   Polysubstance (including opioids) dependence, daily use   ED Assessment Time Calculation: Start Time: 0920 Stop Time: 0942 Total Time in Minutes (Assessment Completion): 22   HPI: Per Triage Note: Pt brought in by [employer] after bystanders called in for concern as pt was seen drinking water from the street. [employer] reports pt seems paranoid and emotionally upset. Pt reports SI thoughts, but no plan to act on. Pt has self inflicted cuts on his abd and arms in healing stages. Pt reports hearing voices that are telling him that he needs help. Pt has drank alcohol tonight and using crack-cocaine. Pt reports newly homeless within the past 2 days, pt clothes dirty and no shoes with pt. Pt is visually upset and crying.    Subjective: Mark Hammond, 32 y.o., male patient seen face to face by this provider, consulted with Dr. Lucianne Muss; and chart reviewed on 01/13/23.  On evaluation Mark Hammond reports that he is here due to being recently homeless, patient states that he was living with his ex-girlfriend, and because of using drugs, he started feeling out of her house and she put him out of her home.  Patient endorses SI, states he has no plan, patient also endorses HI, not saying who he wants to harm, and with no plan, patient also endorses AVH, stating " does not matter what I see or hear."  Patient appears very irritable and anxious while talking with provider, he gives brief answers to all questions and appears uninterested in participating or engaging with psychiatric team.  Patient endorses using "everything "but mainly crack/cocaine.  Patient UDS positive for amphetamines, cocaine, and THC. Patient BAL 163. Patient  states he has no support system.   During evaluation Mark Hammond is laying in bed in no acute distress.  He is alert, oriented x 3, calm, cooperative and attentive. His mood is irritable  with congruent affect. He has normal speech, and behavior.  Objectively there is no evidence of psychosis/mania or delusional thinking.  Patient is able to converse coherently, goal directed thoughts, no distractibility, or pre-occupation.  Patient endorses SI/HI, with no plan, and endorses AVH, but unable to tell provider what he is hearing or seeing at this time.  Patient denies psychosis, and paranoia.  Per chart review patient [facility] until October 30, 2019.  Patient has a long history of polysubstance abuse, and alcohol.  Per labs results patient is hypokalemic is 3.2, decreased calcium 8.3, and increased liver enzymes.  Place order for EKG, TSH, A1c, and lipid panel.   Past Psychiatric History: Schizoaffective disorder, bipolar type (HCC)   Risk to Self: Passive SI  Risk to Others:  Passive HI  Prior Inpatient Therapy:  Yes  Prior Outpatient Therapy:  No   [place] Scale:  Flowsheet Row ED from 01/13/2023 in [facility] Emergency Department at [facility] ED from 02/11/2022 in [facility] Emergency Department at [facility] ED from 10/11/2021 in [facility] at [facility] RISK CATEGORY High Risk Low Risk No Risk       AIMS:  , , ,  ,   ASAM:    Substance Abuse:  Alcohol /  Drug Use Pain Medications: None Prescriptions: None Over the Counter: Tylenol, benedryl History of alcohol / drug use?: Yes  Past Medical History:  Past Medical History:  Diagnosis Date   Alcoholic gastritis    Bipolar 1 disorder    Dental abscess    Depression    Drug abuse    ETOH abuse    Hallucination    Schizo affective schizophrenia    History reviewed. No pertinent surgical history. Family History:  Family History   Problem Relation Age of Onset   Stroke Other    Social History:  Social History   Substance and Sexual Activity  Alcohol Use Yes     Social History   Substance and Sexual Activity  Drug Use Yes   Frequency: 7.0 times per week   Types: "Crack" cocaine, Heroin, MDMA (Ecstacy), Marijuana, Cocaine    Social History   Socioeconomic History   Marital status: Single    Spouse name: Not on file   Number of children: Not on file   Years of education: Not on file   Highest education level: Not on file  Occupational History   Not on file  Tobacco Use   Smoking status: Every Day    Packs/day: 1    Types: Cigarettes   Smokeless tobacco: Never  Vaping Use   Vaping Use: Every day   Substances: THC  Substance and Sexual Activity   Alcohol use: Yes   Drug use: Yes    Frequency: 7.0 times per week    Types: "Crack" cocaine, Heroin, MDMA (Ecstacy), Marijuana, Cocaine   Sexual activity: Yes  Other Topics Concern   Not on file  Social History Narrative   Pt lives in [place] with ex-girlfriend.  Pt is not followed by an outpatient psychiatric provider.   Social Determinants of Health   Financial Resource Strain: Not on file  Food Insecurity: Not on file  Transportation Needs: Not on file  Physical Activity: Not on file  Stress: Not on file  Social Connections: Not on file      Allergies:   Allergies  Allergen Reactions   Codeine Other (See Comments)    Patient states parents told him he was allergic at a young age.    Labs:  Results for orders placed or performed during the hospital encounter of 01/13/23 (from the past 48 hour(s))  CBC with Differential     Status: None   Collection Time: 01/13/23  4:09 AM  Result Value Ref Range   WBC 8.7 4.0 - 10.5 K/uL   RBC 4.79 4.22 - 5.81 MIL/uL   Hemoglobin 15.7 13.0 - 17.0 g/dL   HCT 16.1 09.6 - 04.5 %   MCV 91.9 80.0 - 100.0 fL   MCH 32.8 26.0 - 34.0 pg   MCHC 35.7 30.0 - 36.0 g/dL   RDW 40.9 81.1 - 91.4 %    Platelets 239 150 - 400 K/uL   nRBC 0.0 0.0 - 0.2 %   Neutrophils Relative % 55 %   Neutro Abs 4.8 1.7 - 7.7 K/uL   Lymphocytes Relative 35 %   Lymphs Abs 3.1 0.7 - 4.0 K/uL   Monocytes Relative 8 %   Monocytes Absolute 0.7 0.1 - 1.0 K/uL   Eosinophils Relative 1 %   Eosinophils Absolute 0.1 0.0 - 0.5 K/uL   Basophils Relative 1 %   Basophils Absolute 0.1 0.0 - 0.1 K/uL   Immature Granulocytes 0 %   Abs Immature Granulocytes 0.03 0.00 - 0.07 K/uL  Comment: Performed at [facility], [address], [place], [place] [address]  Comprehensive metabolic panel     Status: Abnormal   Collection Time: 01/13/23  4:09 AM  Result Value Ref Range   Sodium 136 135 - 145 mmol/L   Potassium 3.2 (L) 3.5 - 5.1 mmol/L   Chloride 101 98 - 111 mmol/L   CO2 24 22 - 32 mmol/L   Glucose, Bld 130 (H) 70 - 99 mg/dL    Comment: Glucose reference range applies only to samples taken after fasting for at least 8 hours.   BUN 8 6 - 20 mg/dL   Creatinine, Ser 2.95 0.61 - 1.24 mg/dL   Calcium 8.3 (L) 8.9 - 10.3 mg/dL   Total Protein 8.2 (H) 6.5 - 8.1 g/dL   Albumin 4.1 3.5 - 5.0 g/dL   AST 621 (H) 15 - 41 U/L   ALT 247 (H) 0 - 44 U/L   Alkaline Phosphatase 120 38 - 126 U/L   Total Bilirubin 1.0 0.3 - 1.2 mg/dL   GFR, Estimated >30 >86 mL/min    Comment: (NOTE) Calculated using the CKD-EPI Creatinine Equation (2021)    Anion gap 11 5 - 15    Comment: Performed at [facility], [address] [place], [place] [address]  Ethanol     Status: Abnormal   Collection Time: 01/13/23  4:09 AM  Result Value Ref Range   Alcohol, Ethyl (B) 163 (H) <10 mg/dL    Comment: (NOTE) Lowest detectable limit for serum alcohol is 10 mg/dL.  For medical purposes only. Performed at [facility], [address] [place], [place] [address]   Rapid urine drug screen (hospital performed)     Status: Abnormal   Collection Time: 01/13/23  4:09 AM  Result Value Ref  Range   Opiates NONE DETECTED NONE DETECTED   Cocaine POSITIVE (A) NONE DETECTED   Benzodiazepines NONE DETECTED NONE DETECTED   Amphetamines POSITIVE (A) NONE DETECTED   Tetrahydrocannabinol POSITIVE (A) NONE DETECTED   Barbiturates NONE DETECTED NONE DETECTED    Comment: (NOTE) DRUG SCREEN FOR MEDICAL PURPOSES ONLY.  IF CONFIRMATION IS NEEDED FOR ANY PURPOSE, NOTIFY LAB WITHIN 5 DAYS.  LOWEST DETECTABLE LIMITS FOR URINE DRUG SCREEN Drug Class                     Cutoff (ng/mL) Amphetamine and metabolites    1000 Barbiturate and metabolites    200 Benzodiazepine                 200 Opiates and metabolites        300 Cocaine and metabolites        300 THC                            50 Performed at [facility], [address] [place], [place] [address]     Current Facility-Administered Medications  Medication Dose Route Frequency Provider Last Rate Last Admin   hydrOXYzine (ATARAX) tablet 25 mg  25 mg Oral Q6H PRN Motley-Mangrum, Jalisha Enneking A, PMHNP       loperamide (IMODIUM) capsule 2-4 mg  2-4 mg Oral PRN Motley-Mangrum, Zeynep Fantroy A, PMHNP       LORazepam (ATIVAN) tablet 1 mg  1 mg Oral Q6H PRN Motley-Mangrum, Evea Sheek A, PMHNP       multivitamin with minerals tablet 1 tablet  1 tablet Oral Daily  Motley-Mangrum, Geralynn Ochs A, PMHNP       OLANZapine zydis (ZYPREXA) disintegrating tablet 10 mg  10 mg Oral QHS Horton, Mayer Masker, MD   10 mg at 01/13/23 0529   ondansetron (ZOFRAN-ODT) disintegrating tablet 4 mg  4 mg Oral Q6H PRN Motley-Mangrum, Branton Einstein A, PMHNP       thiamine (VITAMIN B1) injection 100 mg  100 mg Intramuscular Once Motley-Mangrum, Ezra Sites, PMHNP       [START ON 01/14/2023] thiamine (VITAMIN B1) tablet 100 mg  100 mg Oral Daily Motley-Mangrum, Aariah Godette A, PMHNP       Current Outpatient Medications  Medication Sig Dispense Refill   famotidine (PEPCID) 20 MG tablet Take 1 tablet (20 mg total) by mouth 2 (two) times daily. 30 tablet 0   ondansetron (ZOFRAN) 4  MG tablet Take 1 tablet (4 mg total) by mouth every 6 (six) hours. 12 tablet 0    Musculoskeletal:  Patient observed resting in bed.    Psychiatric Specialty Exam: Presentation  Mark Appearance:  Disheveled  Eye Contact: Fleeting  Speech: Clear and Coherent  Speech Volume: Normal  Handedness:No data recorded  Mood and Affect  Mood: Anxious; Irritable  Affect: Labile   Thought Process  Thought Processes: Coherent  Descriptions of Associations:Intact  Orientation:Full (Time, Place and Person)  Thought Content:WDL  History of Schizophrenia/Schizoaffective disorder:Yes  Duration of Psychotic Symptoms:Greater than six months  Hallucinations:Hallucinations: Auditory; Visual  Ideas of Reference:None  Suicidal Thoughts:Suicidal Thoughts: Yes, Passive SI Passive Intent and/or Plan: Without Plan  Homicidal Thoughts:Homicidal Thoughts: No   Sensorium  Memory: Immediate Fair; Recent Fair  Judgment: Poor  Insight: Poor   Executive Functions  Concentration: Poor  Attention Span: Fair  Recall: Fair  Fund of Knowledge: Good  Language: Good   Psychomotor Activity  Psychomotor Activity: Psychomotor Activity: Normal   Assets  Assets: Communication Skills; Physical Health    Sleep  Sleep: Sleep: Poor   Physical Exam: Physical Exam Vitals and nursing note reviewed. Exam conducted with a chaperone present.  HENT:     Nose: Nose normal.  Musculoskeletal:        Mark: Normal range of motion.     Cervical back: Normal range of motion.  Neurological:     Mental Status: He is alert.  Psychiatric:        Attention and Perception: Attention normal.        Mood and Affect: Mood is anxious. Affect is labile.        Speech: Speech normal.        Behavior: Behavior is agitated. Behavior is cooperative.        Thought Content: Thought content includes homicidal and suicidal ideation.        Cognition and Memory: Memory normal.  Cognition is impaired.        Judgment: Judgment is impulsive.    Review of Systems  Constitutional: Negative.   HENT: Negative.    Psychiatric/Behavioral:  Positive for hallucinations, substance abuse and suicidal ideas.    Blood pressure 127/79, pulse (!) 102, temperature 99 F (37.2 C), temperature source Oral, resp. rate 18, height 5\' 5"  (1.651 m), weight 59 kg, SpO2 97 %. Body mass index is 21.63 kg/m.   Medical Decision Making: Patient is a 32 year old male with the above-stated past psychiatric history who is seen in follow-up.  Diagnosis: #1 schizoaffective disorder; bipolar type versus substance-induced psychotic disorder, #2 methamphetamine dependence, #3 cocaine dependence, #4 marijuana dependence Patient case review and discussed with Dr. Lucianne Muss. Patient  needs inpatient psychiatric admission for stabilization and treatment.    Disposition: Recommend psychiatric Inpatient admission when medically cleared.  Nivan Melendrez MOTLEY-MANGRUM, PMHNP 01/13/2023 10:25 AM

## 2023-01-13 NOTE — Progress Notes (Signed)
D- Patient alert and oriented. Denies SI, HI, AVH, and pain. Patient reports that his last drink was 01/12/23 when he drank (4) 40's. States that he typically drinks 4-5 40's per day. CIWA assessed 9. Blisters on toes assessed. Patient instructed to take a shower to cleanse feet since they were soiled. Patient complied. Provider notified.   A- Scheduled medication administered to patient along with PRN trazodone and hydroxyzine, per MAR. Support and encouragement provided.  Routine safety checks conducted every 15 minutes.  Patient informed to notify staff with problems or concerns.  R- No adverse drug reactions noted. Patient contracts for safety at this time. Patient compliant with medications and treatment plan. Patient receptive, calm, and cooperative. Patient interacts well with others on the unit.  Patient remains safe at this time.

## 2023-01-13 NOTE — Progress Notes (Signed)
Admission note: Patient is a 32 year old male,  who was voluntarily admitted from [facility] for bizarre behavior, alcohol abuse,  paranoia and polysubstance abuse.  According to the report received from ED nurse, Patient was found on the street drinking, and intoxicated. [employer] was called to bring him to the ED. Patient UDS was positive for crack cocaine, amphetamine, and mariajuana. Patient arrived to the unit by safe transport, he is alert and oriented X's 4 on arrival to the unit and was able to answer interview questions. Patient states he drinks 5, 42 ounces of beer everyday, he states "I smokes two parks of cigarette per day, that's why my mouth looks like this." Patient lips have some dry bruise marks. Patient refused nicotine patch when offered by nurse for smoking cessation. Patient denies SI/HI/AH, but endorses visual hallucination seeing religious things. Patient states he lives in the house with his ex-girl friend,and he would like to work on his addiction, and religious belief. Patient states his stressor as, his addiction and life in general. Pt has been oriented to unit, room and routine. Information packet given to patient and safety information discussed with him.  Admission INP armband ID verified with patient, and in place. Fall risk assessment completed with Patient and he verbalized understanding of risks associated with falls. No contraband found during skin assessment, patient has tattoo on bilateral hands and arms, self inflicted cut noted all over his abdomen. Q 15 minutes safety observation in place. Staff will continue to provide support to patient.

## 2023-01-14 ENCOUNTER — Encounter (HOSPITAL_COMMUNITY): Payer: Self-pay | Admitting: Psychiatry

## 2023-01-14 DIAGNOSIS — F259 Schizoaffective disorder, unspecified: Secondary | ICD-10-CM | POA: Diagnosis not present

## 2023-01-14 DIAGNOSIS — F250 Schizoaffective disorder, bipolar type: Secondary | ICD-10-CM | POA: Diagnosis not present

## 2023-01-14 DIAGNOSIS — G4700 Insomnia, unspecified: Secondary | ICD-10-CM | POA: Diagnosis present

## 2023-01-14 DIAGNOSIS — F411 Generalized anxiety disorder: Secondary | ICD-10-CM | POA: Diagnosis present

## 2023-01-14 LAB — LIPID PANEL
Cholesterol: 126 mg/dL (ref 0–200)
HDL: 56 mg/dL (ref >40)
LDL Cholesterol: 57 mg/dL (ref 0–99)
Total CHOL/HDL Ratio: 2.3 RATIO
Triglycerides: 65 mg/dL (ref <150)
VLDL: 13 mg/dL (ref 0–40)

## 2023-01-14 LAB — HEMOGLOBIN A1C
Hgb A1c MFr Bld: 5.7 % — ABNORMAL HIGH (ref 4.8–5.6)
Mean Plasma Glucose: 116.89 mg/dL

## 2023-01-14 LAB — TSH: TSH: 1.134 u[IU]/mL (ref 0.350–4.500)

## 2023-01-14 MED ORDER — OLANZAPINE 5 MG PO TBDP
5.0000 mg | ORAL_TABLET | Freq: Every day | ORAL | Status: DC
  Administered 2023-01-15: 5 mg via ORAL
  Filled 2023-01-14 (×3): qty 1

## 2023-01-14 MED ORDER — LORAZEPAM 1 MG PO TABS
1.0000 mg | ORAL_TABLET | Freq: Every day | ORAL | Status: AC
  Administered 2023-01-17: 1 mg via ORAL
  Filled 2023-01-14: qty 1

## 2023-01-14 MED ORDER — LOPERAMIDE HCL 2 MG PO CAPS: 2.0000 mg | ORAL_CAPSULE | ORAL | Status: AC | PRN

## 2023-01-14 MED ORDER — LORAZEPAM 1 MG PO TABS
1.0000 mg | ORAL_TABLET | Freq: Two times a day (BID) | ORAL | Status: AC
  Administered 2023-01-16 (×2): 1 mg via ORAL
  Filled 2023-01-14 (×2): qty 1

## 2023-01-14 MED ORDER — LORAZEPAM 1 MG PO TABS
1.0000 mg | ORAL_TABLET | Freq: Four times a day (QID) | ORAL | Status: AC
  Administered 2023-01-14 (×4): 1 mg via ORAL
  Filled 2023-01-14 (×4): qty 1

## 2023-01-14 MED ORDER — POTASSIUM CHLORIDE CRYS ER 20 MEQ PO TBCR
40.0000 meq | EXTENDED_RELEASE_TABLET | ORAL | Status: AC
  Administered 2023-01-14 (×2): 40 meq via ORAL
  Filled 2023-01-14 (×4): qty 2

## 2023-01-14 MED ORDER — LORAZEPAM 1 MG PO TABS
1.0000 mg | ORAL_TABLET | Freq: Three times a day (TID) | ORAL | Status: AC
  Administered 2023-01-15 (×3): 1 mg via ORAL
  Filled 2023-01-14 (×3): qty 1

## 2023-01-14 MED ORDER — VITAMIN B-1 100 MG PO TABS
100.0000 mg | ORAL_TABLET | Freq: Every day | ORAL | Status: DC
  Filled 2023-01-14 (×2): qty 1

## 2023-01-14 MED ORDER — CLONIDINE HCL 0.1 MG PO TABS
0.1000 mg | ORAL_TABLET | Freq: Once | ORAL | Status: AC
  Administered 2023-01-14: 0.1 mg via ORAL
  Filled 2023-01-14 (×2): qty 1

## 2023-01-14 NOTE — [facility] Suicide Risk Assessment (Signed)
Suicide Risk Assessment  Admission Assessment    [facility] Admission Suicide Risk Assessment   Nursing information obtained from:  Patient Demographic factors:  Male, Caucasian, Low socioeconomic status Current Mental Status:  Suicidal ideation indicated by others, Suicidal ideation indicated by patient, Self-harm behaviors, Self-harm thoughts Loss Factors:  NA Historical Factors:  Impulsivity Risk Reduction Factors:  Living with another person, especially a relative  Total Time spent with patient: 1.5 hours Principal Problem: Schizoaffective disorder Diagnosis:  Principal Problem:   Schizoaffective disorder Active Problems:   Cocaine abuse with cocaine-induced disorder   Alcohol use disorder, severe, dependence   Anxiety state   Insomnia  Subjective Data: :"I had a psychiatric break."   Continued Clinical Symptoms: Psychosis and depressive symptoms in need of continuous hospitalization and treatment. Alcohol Use Disorder Identification Test Final Score (AUDIT): 26 The "Alcohol Use Disorders Identification Test", Guidelines for Use in Primary Care, Second Edition.  World Science writer [facility]). Score between 0-7:  no or low risk or alcohol related problems. Score between 8-15:  moderate risk of alcohol related problems. Score between 16-19:  high risk of alcohol related problems. Score 20 or above:  warrants further diagnostic evaluation for alcohol dependence and treatment.  CLINICAL FACTORS:   Depression:   Anhedonia Comorbid alcohol abuse/dependence Hopelessness Impulsivity Insomnia Severe Alcohol/Substance Abuse/Dependencies More than one psychiatric diagnosis Previous Psychiatric Diagnoses and Treatments  Musculoskeletal: Strength & Muscle Tone: within normal limits Gait & Station: normal Patient leans: N/A  Psychiatric Specialty Exam:  Presentation  General Appearance:  Disheveled  Eye Contact: Fair  Speech: Clear and Coherent  Speech  Volume: Normal  Handedness: Right   Mood and Affect  Mood: Depressed  Affect: Congruent   Thought Process  Thought Processes: Coherent  Descriptions of Associations:Intact  Orientation:Full (Time, Place and Person)  Thought Content:Logical  History of Schizophrenia/Schizoaffective disorder:No  Duration of Psychotic Symptoms:Greater than six months  Hallucinations:Hallucinations: Auditory Description of Auditory Hallucinations: voices commanding in nature  Ideas of Reference:Paranoia  Suicidal Thoughts:Suicidal Thoughts: No SI Passive Intent and/or Plan: Without Plan  Homicidal Thoughts:Homicidal Thoughts: No   Sensorium  Memory: Immediate Good  Judgment: Poor  Insight: Poor   Executive Functions  Concentration: Fair  Attention Span: Fair  Recall: Poor  Fund of Knowledge: Poor  Language: Fair   Lexicographer Activity: Psychomotor Activity: Normal   Assets  Assets: Resilience   Sleep  Sleep: Sleep: Poor    Physical Exam: Physical Exam Review of Systems  Constitutional: Negative.   Eyes: Negative.   Respiratory: Negative.    Cardiovascular: Negative.   Gastrointestinal:  Negative for heartburn.  Genitourinary: Negative.   Musculoskeletal: Negative.   Skin: Negative.   Neurological:  Negative for dizziness.  Psychiatric/Behavioral:  Positive for depression, hallucinations and substance abuse. Negative for memory loss and suicidal ideas. The patient is nervous/anxious and has insomnia.    Blood pressure 112/75, pulse 68, temperature 98.5 F (36.9 C), temperature source Oral, resp. rate 20, height 5\' 4"  (1.626 m), weight 56.5 kg, SpO2 97 %. Body mass index is 21.39 kg/m.   COGNITIVE FEATURES THAT CONTRIBUTE TO RISK:  None    SUICIDE RISK:   Mild:  Suicidal ideation of limited frequency, intensity, duration, and specificity.  There are no identifiable plans, no associated intent, mild dysphoria and  related symptoms, good self-control (both objective and subjective assessment), few other risk factors, and identifiable protective factors, including available and accessible social support.  PLAN OF CARE: See H & P  I  certify that inpatient services furnished can reasonably be expected to improve the patient's condition.   Starleen Blue, NP 01/14/2023, 6:29 PM
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
## 2023-01-14 NOTE — Progress Notes (Signed)
Adult Psychoeducational Group Note  Date:  01/14/2023 Time:  8:36 PM  Group Topic/Focus:  Wrap-Up Group:   The focus of this group is to help patients review their daily goal of treatment and discuss progress on daily workbooks.  Participation Level:  Did Not Attend  Participation Quality:   Did Not Attend  Affect:   Did Not Attend  Cognitive:   Did Not Attend  Insight: None  Engagement in Group:   Did Not Attend  Modes of Intervention:   Did Not Attend  Additional Comments:  Pt was encouraged to attend wrap up group but did not attend.  Felipa Furnace 01/14/2023, 8:36 PM

## 2023-01-14 NOTE — Group Note (Signed)
Date:  01/14/2023 Time:  10:51 AM  Group Topic/Focus:  Making Healthy Choices:   The focus of this group is to help patients identify negative/unhealthy choices they were using prior to admission and identify positive/healthier coping strategies to replace them upon discharge.    Participation Level:  Did Not Attend   Fielding Mault W Warner [place] 01/14/2023, 10:51 AM  

## 2023-01-14 NOTE — Progress Notes (Signed)
   01/14/23 0601  15 Minute Checks  Location Bedroom  Visual Appearance Calm  Behavior Sleeping  Sleep (Behavioral Health Patients Only)  Calculate sleep? (Click Yes once per 24 hr at 0600 safety check) Yes  Documented sleep last 24 hours 8.25

## 2023-01-14 NOTE — Group Note (Signed)
Date:  01/14/2023 Time:  10:07 AM  Group Topic/Focus:  Goals Group:   The focus of this group is to help patients establish daily goals to achieve during treatment and discuss how the patient can incorporate goal setting into their daily lives to aide in recovery. Orientation:   The focus of this group is to educate the patient on the purpose and policies of crisis stabilization and provide a format to answer questions about their admission.  The group details unit policies and expectations of patients while admitted.    Participation Level:  Did Not Attend   Mark Hammond 01/14/2023, 10:07 AM

## 2023-01-14 NOTE — Progress Notes (Signed)
Pt has been in bed all day. Pt tolerating food and fluid. Pt does endorse nausea, no vomiting noted. CIWA 5. Pt endorses auditory and visual hallucinations. Medication compliant. Hydration encouraged. Group attendance encouraged. Q 15 minute checks ongoing

## 2023-01-14 NOTE — Progress Notes (Signed)
CIWA NOTE: No s/s of distress, pain, and/or discomfort noted at this time. Pt denies any ideations. Pt admits to hearing voices and states, "I try to ignore them." Writer asked Patient to please let her know if at any point Pt receives directives from voices and/or feels overwhelmed. Pt verbalized agreement and compliance.

## 2023-01-14 NOTE — Progress Notes (Signed)
   01/14/23 0625  Vital Signs  Pulse Rate 85  BP (!) 158/88  BP Location Left Arm  BP Method Automatic  Patient Position (if appropriate) Standing   CIWA score 12. Provider notified. Clonidine and prn hydroxyzine administered, per MAR. No adverse drug reactions noted. Patient remains safe at this time.

## 2023-01-15 DIAGNOSIS — F250 Schizoaffective disorder, bipolar type: Secondary | ICD-10-CM | POA: Diagnosis not present

## 2023-01-15 DIAGNOSIS — F259 Schizoaffective disorder, unspecified: Secondary | ICD-10-CM | POA: Diagnosis not present

## 2023-01-15 LAB — COMPREHENSIVE METABOLIC PANEL
ALT: 211 U/L — ABNORMAL HIGH (ref 0–44)
AST: 288 U/L — ABNORMAL HIGH (ref 15–41)
Albumin: 3.7 g/dL (ref 3.5–5.0)
Alkaline Phosphatase: 115 U/L (ref 38–126)
Anion gap: 8 (ref 5–15)
BUN: 10 mg/dL (ref 6–20)
CO2: 26 mmol/L (ref 22–32)
Calcium: 8.9 mg/dL (ref 8.9–10.3)
Chloride: 105 mmol/L (ref 98–111)
Creatinine, Ser: 0.62 mg/dL (ref 0.61–1.24)
GFR, Estimated: >60 mL/min (ref >60)
Glucose, Bld: 126 mg/dL — ABNORMAL HIGH (ref 70–99)
Potassium: 3.7 mmol/L (ref 3.5–5.1)
Sodium: 139 mmol/L (ref 135–145)
Total Bilirubin: 0.8 mg/dL (ref 0.3–1.2)
Total Protein: 7.8 g/dL (ref 6.5–8.1)

## 2023-01-15 LAB — VITAMIN D 25 HYDROXY (VIT D DEFICIENCY, FRACTURES): Vit D, 25-Hydroxy: 26.86 ng/mL — ABNORMAL LOW (ref 30–100)

## 2023-01-15 MED ORDER — WHITE PETROLATUM EX OINT
TOPICAL_OINTMENT | CUTANEOUS | Status: AC
  Filled 2023-01-15: qty 5

## 2023-01-15 MED ORDER — VITAMIN D 25 MCG (1000 UNIT) PO TABS
1000.0000 [IU] | ORAL_TABLET | Freq: Every day | ORAL | Status: DC
  Administered 2023-01-15 – 2023-01-22 (×8): 1000 [IU] via ORAL
  Filled 2023-01-15 (×11): qty 1

## 2023-01-15 MED ORDER — OLANZAPINE 10 MG PO TBDP
10.0000 mg | ORAL_TABLET | Freq: Two times a day (BID) | ORAL | Status: DC
  Administered 2023-01-15 – 2023-01-23 (×16): 10 mg via ORAL
  Filled 2023-01-15 (×22): qty 1

## 2023-01-15 MED ORDER — MENTHOL 3 MG MT LOZG: 1.0000 | LOZENGE | OROMUCOSAL | Status: DC | PRN

## 2023-01-15 MED ORDER — TRAZODONE HCL 100 MG PO TABS
100.0000 mg | ORAL_TABLET | Freq: Every day | ORAL | Status: DC
  Administered 2023-01-15 – 2023-01-17 (×3): 100 mg via ORAL
  Filled 2023-01-15 (×7): qty 1

## 2023-01-15 NOTE — Group Note (Signed)
Date:  01/15/2023 Time:  8:55 PM  Group Topic/Focus:  Wrap-Up Group:   The focus of this group is to help patients review their daily goal of treatment and discuss progress on daily workbooks.    Participation Level:  Active  Participation Quality:  Appropriate  Affect:  Appropriate  Cognitive:  Appropriate  Insight: Appropriate  Engagement in Group:  Engaged  Modes of Intervention:  Education and Exploration  Additional Comments:  Patient attended and participated in group tonight. He reports that today was significant because he attended groups which was good. His smoking cough is getting better.  Lita Mains [facility] 01/15/2023, 8:55 PM

## 2023-01-15 NOTE — Progress Notes (Signed)
Pt in room most of the day. Pt does remain medication compliant. Pt attended some meals. Pt endorses withdrawal symptoms including sensitivity to sounds and lights. Pt endorses auditory hallucinations. Pt presents with tangential speech. Fluids encouraged. Group attendance encouraged. Q 15  minute checks ongoing.

## 2023-01-15 NOTE — Progress Notes (Signed)
CIWA NOTE: Patient is asleep in bed, resting well. No s/s of distress and/or discomfort noted.

## 2023-01-15 NOTE — Progress Notes (Signed)
[facility] MD Progress Note  01/15/2023 5:37 PM Melchizedek Espinola  MRN:  [id number] Principal Problem: Schizoaffective disorder Diagnosis: Principal Problem:   Schizoaffective disorder Active Problems:   Cocaine abuse with cocaine-induced disorder   Alcohol use disorder, severe, dependence   Anxiety state   Insomnia  Reason for Admission: Dayshon Roback is a 32 yo male with prior mental health diagnoses of paranoid schizophrenia, cocaine use d/o & alcohol use d/o who was taken to the [facility] ER on 4/12 after bystanders called 911 to report that pt was exhibiting concerning and, bizarre behaviors such as drinking water from the potholes on the street. While at the ER,pt reported +SI, denied having a plan, endorsed +AH & alcohol and cocaine use. He was observed to have self inflicted cuts to his abdomen and b/l arms. Pt was transferred and admitted to this [name] voluntarily for treatment and stabilization of his mental status.   24 hr chart review: Sleep Hours last night: None documented, reports sleep was fair last night  Nursing Concerns: none  Behavioral episodes in the past 24 WUJ:WJXB  Medication Compliance: compliant  Vital Signs in the past 24 hrs: WNL PRN Medications in the past 24 hrs: None   Patient assessment note: Mood remains depressed, anxious, and irritable today. Patient reports feeling slightly better, he denies SI, denies HI, endorses auditory hallucinations of voices which he last heard earlier today morning, states he is unable to make out what the voices were saying.  He states that the voices are multiple, and internal and external.  He reports visual hallucinations of demons and black figures flying around, states that the last time he saw the figures was earlier today morning.  He continues to report feeling like other people know what he is thinking, states that he feels as though other people are out to harm him, reports feeling safe on the unit.  He reports that other people are  able to withdraw thoughts from his brain and put some thoughts back into his brain.  He states that all type of thoughts can be withdrawn from his brain and all types of thoughts can be inserted into his brain.  Patient is observed to be talking to himself, seems to be responding to some type of stimuli while talking to Clinical research associate.  Patient reports that his sleep quality last night was fair, he reports a good appetite, and is observed to be hacking and coughing out into a cup during encounter.  He reports that he acquired a cough while using drugs, and asking for lozenges.  He reports that he is tolerating all of his medications well, denies medication related side effects.  Denies being in any physical pain, but complains of a sore throat.  Patient is currently on Zyprexa 10 mg nightly, and 5 mg in the mornings.  We are increasing Zyprexa to 10 mg twice daily for management of psychosis and mood stabilization.  Ordering throat lozenges for cough.  We will increase trazodone to 100 mg and schedule it for sleep.  We will continue other medications as listed below.  Labs reviewed: Potassium level is now within normal limits at 3.7 after the patient received the 2 doses of 40 mEq yesterday.  LFTs are trending downwards.  Vitamin D level slightly low at 26.86.  We will supplement with T3 daily.  We will continue to follow.  Total Time spent with patient: 45 minutes  Past Psychiatric History: See H & P  Past Medical History:  Past  Medical History:  Diagnosis Date   Alcoholic gastritis    Bipolar 1 disorder    Dental abscess    Depression    Drug abuse    ETOH abuse    Hallucination    Schizo affective schizophrenia    No past surgical history on file. Family History:  Family History  Problem Relation Age of Onset   Stroke Other    Family Psychiatric  History: See H & P Social History:  Social History   Substance and Sexual Activity  Alcohol Use Yes     Social History   Substance and  Sexual Activity  Drug Use Yes   Frequency: 7.0 times per week   Types: "Crack" cocaine, Heroin, MDMA (Ecstacy), Marijuana, Cocaine    Social History   Socioeconomic History   Marital status: Single    Spouse name: Not on file   Number of children: Not on file   Years of education: Not on file   Highest education level: Not on file  Occupational History   Not on file  Tobacco Use   Smoking status: Every Day    Packs/day: 1    Types: Cigarettes   Smokeless tobacco: Never  Vaping Use   Vaping Use: Every day   Substances: THC  Substance and Sexual Activity   Alcohol use: Yes   Drug use: Yes    Frequency: 7.0 times per week    Types: "Crack" cocaine, Heroin, MDMA (Ecstacy), Marijuana, Cocaine   Sexual activity: Yes  Other Topics Concern   Not on file  Social History Narrative   Pt lives in [place] with ex-girlfriend.  Pt is not followed by an outpatient psychiatric provider.   Social Determinants of Health   Financial Resource Strain: Not on file  Food Insecurity: Food Insecurity Present (01/13/2023)   Hunger Vital Sign    Worried About Running Out of Food in the Last Year: Often true    Ran Out of Food in the Last Year: Sometimes true  Transportation Needs: Unmet Transportation Needs (01/13/2023)   PRAPARE - Administrator, Civil Service (Medical): Yes    Lack of Transportation (Non-Medical): Yes  Physical Activity: Not on file  Stress: Not on file  Social Connections: Not on file   Sleep: Fair  Appetite:  Good  Current Medications: Current Facility-Administered Medications  Medication Dose Route Frequency Provider Last Rate Last Admin   acetaminophen (TYLENOL) tablet 650 mg  650 mg Oral Q6H PRN Motley-Mangrum, Jadeka A, PMHNP       alum & mag hydroxide-simeth (MAALOX/MYLANTA) 200-200-20 MG/5ML suspension 30 mL  30 mL Oral Q4H PRN Motley-Mangrum, Jadeka A, PMHNP       cholecalciferol (VITAMIN D3) 25 MCG (1000 UNIT) tablet 1,000 Units  1,000 Units Oral  Daily Zakiya Sporrer, NP       diphenhydrAMINE (BENADRYL) capsule 50 mg  50 mg Oral TID PRN Motley-Mangrum, Jadeka A, PMHNP       Or   diphenhydrAMINE (BENADRYL) injection 50 mg  50 mg Intramuscular TID PRN Motley-Mangrum, Jadeka A, PMHNP       haloperidol (HALDOL) tablet 5 mg  5 mg Oral TID PRN Motley-Mangrum, Jadeka A, PMHNP       Or   haloperidol lactate (HALDOL) injection 5 mg  5 mg Intramuscular TID PRN Motley-Mangrum, Jadeka A, PMHNP       hydrOXYzine (ATARAX) tablet 25 mg  25 mg Oral TID PRN Motley-Mangrum, Jadeka A, PMHNP   25 mg at 01/14/23 [phone]  loperamide (IMODIUM) capsule 2-4 mg  2-4 mg Oral PRN Motley-Mangrum, Jadeka A, PMHNP       loperamide (IMODIUM) capsule 2-4 mg  2-4 mg Oral PRN Yasmin Dibello, NP       LORazepam (ATIVAN) tablet 2 mg  2 mg Oral TID PRN Motley-Mangrum, Geralynn Ochs A, PMHNP       Or   LORazepam (ATIVAN) injection 2 mg  2 mg Intramuscular TID PRN Motley-Mangrum, Geralynn Ochs A, PMHNP       LORazepam (ATIVAN) tablet 1 mg  1 mg Oral TID Starleen Blue, NP   1 mg at 01/15/23 1300   Followed by   Melene Muller ON 01/16/2023] LORazepam (ATIVAN) tablet 1 mg  1 mg Oral BID Starleen Blue, NP       Followed by   Melene Muller ON 01/17/2023] LORazepam (ATIVAN) tablet 1 mg  1 mg Oral Daily Aayra Hornbaker, NP       magnesium hydroxide (MILK OF MAGNESIA) suspension 30 mL  30 mL Oral Daily PRN Motley-Mangrum, Jadeka A, PMHNP       menthol-cetylpyridinium (CEPACOL) lozenge 3 mg  1 lozenge Oral PRN Starleen Blue, NP       multivitamin with minerals tablet 1 tablet  1 tablet Oral Daily Motley-Mangrum, Jadeka A, PMHNP   1 tablet at 01/15/23 0806   OLANZapine zydis (ZYPREXA) disintegrating tablet 10 mg  10 mg Oral BID Starleen Blue, NP       ondansetron (ZOFRAN-ODT) disintegrating tablet 4 mg  4 mg Oral Q6H PRN Motley-Mangrum, Jadeka A, PMHNP   4 mg at 01/14/23 1327   thiamine (Vitamin B-1) tablet 100 mg  100 mg Oral Daily Motley-Mangrum, Jadeka A, PMHNP   100 mg at 01/15/23 0806   traZODone (DESYREL)  tablet 100 mg  100 mg Oral QHS Starleen Blue, NP        Lab Results:  Results for orders placed or performed during the hospital encounter of 01/13/23 (from the past 48 hour(s))  Hemoglobin A1c     Status: Abnormal   Collection Time: 01/14/23  6:17 AM  Result Value Ref Range   Hgb A1c MFr Bld 5.7 (H) 4.8 - 5.6 %    Comment: (NOTE) Pre diabetes:          5.7%-6.4%  Diabetes:              >6.4%  Glycemic control for   <7.0% adults with diabetes    Mean Plasma Glucose 116.89 mg/dL    Comment: Performed at [facility], [address], [place], [place] [address]  Lipid panel     Status: None   Collection Time: 01/14/23  6:17 AM  Result Value Ref Range   Cholesterol 126 0 - 200 mg/dL   Triglycerides 65 <604 mg/dL   HDL 56 >54 mg/dL   Total CHOL/HDL Ratio 2.3 RATIO   VLDL 13 0 - 40 mg/dL   LDL Cholesterol 57 0 - 99 mg/dL    Comment:        Total Cholesterol/HDL:CHD Risk Coronary Heart Disease Risk Table                     Men   Women  1/2 Average Risk   3.4   3.3  Average Risk       5.0   4.4  2 X Average Risk   9.6   7.1  3 X Average Risk  23.4   11.0        Use the  calculated Patient Ratio above and the CHD Risk Table to determine the patient's CHD Risk.        ATP III CLASSIFICATION (LDL):  <100     mg/dL   Optimal  161-096  mg/dL   Near or Above                    Optimal  130-159  mg/dL   Borderline  045-409  mg/dL   High  >811     mg/dL   Very High Performed at [facility], [address] [place], [place] [address]   TSH     Status: None   Collection Time: 01/14/23  6:17 AM  Result Value Ref Range   TSH 1.134 0.350 - 4.500 uIU/mL    Comment: Performed by a 3rd Generation assay with a functional sensitivity of <=0.01 uIU/mL. Performed at [facility], [address] [place] [address] [place], [place] [address]   VITAMIN D 25 Hydroxy (Vit-D Deficiency, Fractures)     Status: Abnormal   Collection Time: 01/15/23  8:04 AM  Result  Value Ref Range   Vit D, 25-Hydroxy 26.86 (L) 30 - 100 ng/mL    Comment: (NOTE) Vitamin D deficiency has been defined by the Institute of Medicine  and an Endocrine Society practice guideline as a level of serum 25-OH  vitamin D less than 20 ng/mL (1,2). The Endocrine Society went on to  further define vitamin D insufficiency as a level between 21 and 29  ng/mL (2).  1. IOM (Institute of Medicine). 2010. Dietary reference intakes for  calcium and D. [place]: [employer]. 2. Holick MF, Binkley Rockham, Bischoff-Ferrari HA, et al. Evaluation,  treatment, and prevention of vitamin D deficiency: an Endocrine  Society clinical practice guideline, JCEM. 2011 Jul; 96(7): 1911-30.  Performed at [facility], [address] [place], [place] [address]   Comprehensive metabolic panel     Status: Abnormal   Collection Time: 01/15/23  8:04 AM  Result Value Ref Range   Sodium 139 135 - 145 mmol/L   Potassium 3.7 3.5 - 5.1 mmol/L   Chloride 105 98 - 111 mmol/L   CO2 26 22 - 32 mmol/L   Glucose, Bld 126 (H) 70 - 99 mg/dL    Comment: Glucose reference range applies only to samples taken after fasting for at least 8 hours.   BUN 10 6 - 20 mg/dL   Creatinine, Ser 5.78 0.61 - 1.24 mg/dL   Calcium 8.9 8.9 - 46.9 mg/dL   Total Protein 7.8 6.5 - 8.1 g/dL   Albumin 3.7 3.5 - 5.0 g/dL   AST 629 (H) 15 - 41 U/L   ALT 211 (H) 0 - 44 U/L   Alkaline Phosphatase 115 38 - 126 U/L   Total Bilirubin 0.8 0.3 - 1.2 mg/dL   GFR, Estimated >52 >84 mL/min    Comment: (NOTE) Calculated using the CKD-EPI Creatinine Equation (2021)    Anion gap 8 5 - 15    Comment: Performed at [facility], [address] [place], [place] [address]    Blood Alcohol level:  Lab Results  Component Value Date   ETH 163 (H) 01/13/2023   ETH 198 (H) 02/11/2022    Metabolic Disorder Labs: Lab Results  Component Value Date   HGBA1C 5.7 (H) 01/14/2023   MPG 116.89 01/14/2023   MPG  111.15 01/13/2023   Lab Results  Component Value Date   PROLACTIN  31.4 (H) 11/16/2019   Lab Results  Component Value Date   CHOL 126 01/14/2023   TRIG 65 01/14/2023   HDL 56 01/14/2023   CHOLHDL 2.3 01/14/2023   VLDL 13 01/14/2023   LDLCALC 57 01/14/2023   LDLCALC 58 01/13/2023    Physical Findings: AIMS:  , ,  ,  ,    CIWA:  CIWA-Ar Total: 3 COWS:     Musculoskeletal: Strength & Muscle Tone: within normal limits Gait & Station: normal Patient leans: N/A  Psychiatric Specialty Exam:  Presentation  General Appearance:  Appropriate for Environment; Fairly Groomed  Eye Contact: Good  Speech: Clear and Coherent  Speech Volume: Normal  Handedness: Right   Mood and Affect  Mood: Depressed; Anxious; Irritable  Affect: Congruent   Thought Process  Thought Processes: Coherent  Descriptions of Associations:Intact  Orientation:Full (Time, Place and Person)  Thought Content:Logical  History of Schizophrenia/Schizoaffective disorder:Yes  Duration of Psychotic Symptoms:Greater than six months  Hallucinations:Hallucinations: Auditory Description of Auditory Hallucinations: voices commanding in nature  Ideas of Reference:Paranoia  Suicidal Thoughts:Suicidal Thoughts: No  Homicidal Thoughts:Homicidal Thoughts: No   Sensorium  Memory: Immediate Good  Judgment: Poor  Insight: Poor   Executive Functions  Concentration: Poor  Attention Span: Poor  Recall: Poor  Fund of Knowledge: Poor  Language: Fair   Psychomotor Activity  Psychomotor Activity: Psychomotor Activity: Normal   Assets  Assets: Resilience   Sleep  Sleep: Sleep: Fair    Physical Exam: Physical Exam Constitutional:      Appearance: Normal appearance.  HENT:     Head: Normocephalic.     Nose: Nose normal. No congestion.  Eyes:     Pupils: Pupils are equal, round, and reactive to light.  Pulmonary:     Effort: Pulmonary effort is normal.   Musculoskeletal:        General: Normal range of motion.     Cervical back: Normal range of motion.  Neurological:     Mental Status: He is alert and oriented to person, place, and time.    Review of Systems  Constitutional:  Negative for fever.  HENT:  Negative for hearing loss.   Eyes:  Negative for blurred vision.  Respiratory:  Negative for cough.   Cardiovascular:  Negative for chest pain.  Gastrointestinal:  Negative for heartburn.  Genitourinary:  Negative for dysuria.  Musculoskeletal:  Negative for myalgias.  Skin:  Negative for rash.  Neurological:  Negative for dizziness.  Psychiatric/Behavioral:  Positive for depression, hallucinations and substance abuse. Negative for memory loss and suicidal ideas. The patient is nervous/anxious and has insomnia.    Blood pressure 119/83, pulse 84, temperature 98.5 F (36.9 C), temperature source Oral, resp. rate 20, height 5\' 4"  (1.626 m), weight 56.5 kg, SpO2 100 %. Body mass index is 21.39 kg/m.  Treatment Plan Summary: Daily contact with patient to assess and evaluate symptoms and progress in treatment and Medication management   Observation Level/Precautions:  15 minute checks  Laboratory:  Labs reviewed: Potassium level on admission was 3.2, ordered 40 mEq x 2 doses, and repeated level and it is now WNL.  LFTs are also elevated, repeated to see and trending downwards.  This is most likely related to chronic alcoholism.  Psychotherapy:  Unit Group sessions  Medications:  See [facility]  Consultations:  To be determined   Discharge Concerns:  Safety, medication compliance, mood stability  Estimated LOS: 5-7 days  Other:  N/A    PLAN Safety and Monitoring: Voluntary admission to  inpatient psychiatric unit for safety, stabilization and treatment Daily contact with patient to assess and evaluate symptoms and progress in treatment Patient's case to be discussed in multi-disciplinary team meeting Observation Level : q15 minute  checks Vital signs: q12 hours Precautions: Safety    Long Term Goal(s): Improvement in symptoms so as ready for discharge   Short Term Goals: Ability to identify changes in lifestyle to reduce recurrence of condition will improve, Ability to verbalize feelings will improve, Ability to disclose and discuss suicidal ideas, Ability to demonstrate self-control will improve, Ability to identify and develop effective coping behaviors will improve, Ability to maintain clinical measurements within normal limits will improve, Compliance with prescribed medications will improve, and Ability to identify triggers associated with substance abuse/mental health issues will improve   Diagnoses:  Principal Problem:   Schizoaffective disorder Active Problems:   Cocaine abuse with cocaine-induced disorder   Alcohol use disorder, severe, dependence   Anxiety state   Insomnia   Medications -Increase Zyprexa to 10 mg BID for psychosis/mood stabilization  -Start Vitamin D3 daily for vit D deficiency -Continue Ativan detox protocol as per Mar with Ciwas every 6 hours-See the mar for complete order -Increase trazodone to 100 mg nightly for sleep -Continue agitation protocol: Ativan/Haldol/Benadryl every 6 hours as needed-See AR for complete order    PRNS -Continue Hydroxyzine 25 mg every 6 hours PRN -Continue Tylenol 650 mg every 6 hours PRN for mild pain -Continue Maalox 30 mg every 4 hrs PRN for indigestion -Continue Imodium 2-4 mg as needed for diarrhea -Continue Milk of Magnesia as needed every 6 hrs for constipation -Continue Zofran disintegrating tabs every 6 hrs PRN for nausea   Discharge Planning: Social work and case management to assist with discharge planning and identification of hospital follow-up needs prior to discharge Estimated LOS: 5-7 days Discharge Concerns: Need to establish a safety plan; Medication compliance and effectiveness Discharge Goals: Return home with outpatient referrals  for mental health follow-up including medication management/psychotherapy    Starleen Blue, NP 01/15/2023, 5:37 PM

## 2023-01-15 NOTE — Progress Notes (Signed)
Pt is A&OX4, calm, denies suicidal ideations, denies homicidal ideations, admits to auditory hallucinations and denies visual hallucinations. Pt reports "ignoring the voices." Pt verbally agrees to approach staff if these become more apparent/prominent and before harming self or others. Pt denies experiencing nightmares. Mood and affect are congruent. Pt appetite is ok. No complaints of anxiety, distress, pain and/or discomfort at this time. Pt's memory appears to be grossly intact, and Pt hasn't displayed any injurious behaviors. Pt is medication compliant. There's no evidence of suicidal intent. Psychomotor activity was WNL. No s/s of Parkinson, Dystonia, Akathisia and/or Tardive Dyskinesia noted.

## 2023-01-15 NOTE — Progress Notes (Signed)
CIWA NOTE: No s/s of pain, withdrawals, and/or discomfort. Pt remains calm and cooperative.

## 2023-01-16 ENCOUNTER — Encounter (HOSPITAL_COMMUNITY): Payer: Self-pay

## 2023-01-16 DIAGNOSIS — F259 Schizoaffective disorder, unspecified: Secondary | ICD-10-CM | POA: Diagnosis not present

## 2023-01-16 DIAGNOSIS — F250 Schizoaffective disorder, bipolar type: Secondary | ICD-10-CM | POA: Diagnosis not present

## 2023-01-16 MED ORDER — HALOPERIDOL 5 MG PO TABS
5.0000 mg | ORAL_TABLET | Freq: Three times a day (TID) | ORAL | Status: DC
  Administered 2023-01-16 – 2023-01-18 (×5): 5 mg via ORAL
  Filled 2023-01-16 (×10): qty 1

## 2023-01-16 MED ORDER — BENZTROPINE MESYLATE 0.5 MG PO TABS
0.5000 mg | ORAL_TABLET | Freq: Two times a day (BID) | ORAL | Status: DC
  Administered 2023-01-16 – 2023-01-23 (×14): 0.5 mg via ORAL
  Filled 2023-01-16 (×20): qty 1

## 2023-01-16 NOTE — Progress Notes (Signed)
   01/16/23 2038  Psych Admission Type (Psych Patients Only)  Admission Status Voluntary  Psychosocial Assessment  Patient Complaints Anxiety  Eye Contact Fair  Facial Expression Anxious  Affect Anxious  Speech Soft  Interaction Assertive  Motor Activity Other (Comment) (WDL)  Appearance/Hygiene In scrubs  Behavior Characteristics Cooperative  Mood Anxious  Thought Process  Coherency WDL  Content WDL  Delusions None reported or observed  Perception WDL  Hallucination None reported or observed  Judgment Poor  Confusion None  Danger to Self  Current suicidal ideation? Denies  Agreement Not to Harm Self Yes  Description of Agreement verbal  Danger to Others  Danger to Others None reported or observed   Pt reports mild generalized anxiety, denies SI/HI/AVH. Pt is med compliant and cooperative, attended group this evening. Pt reports soreness in toes with walking, given prn tylenol as requested.

## 2023-01-16 NOTE — BH IP Treatment Plan (Signed)
Interdisciplinary Treatment and Diagnostic Plan Update  01/16/2023 Time of Session: 0830 [place] MRN: [id number]  Principal Diagnosis: Schizoaffective disorder  Secondary Diagnoses: Principal Problem:   Schizoaffective disorder Active Problems:   Cocaine abuse with cocaine-induced disorder   Alcohol use disorder, severe, dependence   Anxiety state   Insomnia   Current Medications:  Current Facility-Administered Medications  Medication Dose Route Frequency Provider Last Rate Last Admin   acetaminophen (TYLENOL) tablet 650 mg  650 mg Oral Q6H PRN Motley-Mangrum, Jadeka A, PMHNP   650 mg at 01/15/23 2041   alum & mag hydroxide-simeth (MAALOX/MYLANTA) 200-200-20 MG/5ML suspension 30 mL  30 mL Oral Q4H PRN Motley-Mangrum, Jadeka A, PMHNP       cholecalciferol (VITAMIN D3) 25 MCG (1000 UNIT) tablet 1,000 Units  1,000 Units Oral Daily Starleen Blue, NP   1,000 Units at 01/15/23 2041   diphenhydrAMINE (BENADRYL) capsule 50 mg  50 mg Oral TID PRN Motley-Mangrum, Jadeka A, PMHNP   50 mg at 01/16/23 4098   Or   diphenhydrAMINE (BENADRYL) injection 50 mg  50 mg Intramuscular TID PRN Motley-Mangrum, Jadeka A, PMHNP       haloperidol (HALDOL) tablet 5 mg  5 mg Oral TID PRN Motley-Mangrum, Geralynn Ochs A, PMHNP   5 mg at 01/16/23 1191   Or   haloperidol lactate (HALDOL) injection 5 mg  5 mg Intramuscular TID PRN Motley-Mangrum, Geralynn Ochs A, PMHNP       hydrOXYzine (ATARAX) tablet 25 mg  25 mg Oral TID PRN Motley-Mangrum, Jadeka A, PMHNP   25 mg at 01/14/23 4782   loperamide (IMODIUM) capsule 2-4 mg  2-4 mg Oral PRN Starleen Blue, NP       LORazepam (ATIVAN) tablet 2 mg  2 mg Oral TID PRN Motley-Mangrum, Geralynn Ochs A, PMHNP       Or   LORazepam (ATIVAN) injection 2 mg  2 mg Intramuscular TID PRN Motley-Mangrum, Geralynn Ochs A, PMHNP       LORazepam (ATIVAN) tablet 1 mg  1 mg Oral BID Starleen Blue, NP   1 mg at 01/16/23 0827   Followed by   Melene Muller ON 01/17/2023] LORazepam (ATIVAN) tablet 1 mg  1 mg Oral Daily  Nkwenti, Doris, NP       magnesium hydroxide (MILK OF MAGNESIA) suspension 30 mL  30 mL Oral Daily PRN Motley-Mangrum, Jadeka A, PMHNP       menthol-cetylpyridinium (CEPACOL) lozenge 3 mg  1 lozenge Oral PRN Nkwenti, Doris, NP       multivitamin with minerals tablet 1 tablet  1 tablet Oral Daily Motley-Mangrum, Jadeka A, PMHNP   1 tablet at 01/16/23 0828   OLANZapine zydis (ZYPREXA) disintegrating tablet 10 mg  10 mg Oral BID Starleen Blue, NP   10 mg at 01/16/23 9562   thiamine (Vitamin B-1) tablet 100 mg  100 mg Oral Daily Motley-Mangrum, Jadeka A, PMHNP   100 mg at 01/16/23 0827   traZODone (DESYREL) tablet 100 mg  100 mg Oral QHS Nkwenti, Doris, NP   100 mg at 01/15/23 2041   PTA Medications: No medications prior to admission.    Patient Stressors: Financial difficulties   Loss of relationship/significant other   Marital or family conflict   Medication change or noncompliance   Substance abuse    Patient Strengths: Capable of independent living  Forensic psychologist fund of knowledge  Motivation for treatment/growth  Religious Affiliation   Treatment Modalities: Medication Management, Group therapy, Case management,  1 to 1 session with clinician, Psychoeducation, Recreational  therapy.   Physician Treatment Plan for Primary Diagnosis: Schizoaffective disorder Long Term Goal(s): Improvement in symptoms so as ready for discharge   Short Term Goals: Ability to identify changes in lifestyle to reduce recurrence of condition will improve Ability to verbalize feelings will improve Ability to disclose and discuss suicidal ideas Ability to demonstrate self-control will improve Ability to identify and develop effective coping behaviors will improve Ability to maintain clinical measurements within normal limits will improve Compliance with prescribed medications will improve Ability to identify triggers associated with substance abuse/mental health issues will  improve  Medication Management: Evaluate patient's response, side effects, and tolerance of medication regimen.  Therapeutic Interventions: 1 to 1 sessions, Unit Group sessions and Medication administration.  Evaluation of Outcomes: Progressing  Physician Treatment Plan for Secondary Diagnosis: Principal Problem:   Schizoaffective disorder Active Problems:   Cocaine abuse with cocaine-induced disorder   Alcohol use disorder, severe, dependence   Anxiety state   Insomnia  Long Term Goal(s): Improvement in symptoms so as ready for discharge   Short Term Goals: Ability to identify changes in lifestyle to reduce recurrence of condition will improve Ability to verbalize feelings will improve Ability to disclose and discuss suicidal ideas Ability to demonstrate self-control will improve Ability to identify and develop effective coping behaviors will improve Ability to maintain clinical measurements within normal limits will improve Compliance with prescribed medications will improve Ability to identify triggers associated with substance abuse/mental health issues will improve     Medication Management: Evaluate patient's response, side effects, and tolerance of medication regimen.  Therapeutic Interventions: 1 to 1 sessions, Unit Group sessions and Medication administration.  Evaluation of Outcomes: Progressing   RN Treatment Plan for Primary Diagnosis: Schizoaffective disorder Long Term Goal(s): Knowledge of disease and therapeutic regimen to maintain health will improve  Short Term Goals: Ability to remain free from injury will improve, Ability to verbalize frustration and anger appropriately will improve, Ability to demonstrate self-control, Ability to participate in decision making will improve, Ability to verbalize feelings will improve, Ability to disclose and discuss suicidal ideas, Ability to identify and develop effective coping behaviors will improve, and Compliance with  prescribed medications will improve  Medication Management: RN will administer medications as ordered by provider, will assess and evaluate patient's response and provide education to patient for prescribed medication. RN will report any adverse and/or side effects to prescribing provider.  Therapeutic Interventions: 1 on 1 counseling sessions, Psychoeducation, Medication administration, Evaluate responses to treatment, Monitor vital signs and CBGs as ordered, Perform/monitor CIWA, COWS, AIMS and Fall Risk screenings as ordered, Perform wound care treatments as ordered.  Evaluation of Outcomes: Progressing   LCSW Treatment Plan for Primary Diagnosis: Schizoaffective disorder Long Term Goal(s): Safe transition to appropriate next level of care at discharge, Engage patient in therapeutic group addressing interpersonal concerns.  Short Term Goals: Engage patient in aftercare planning with referrals and resources, Increase social support, Increase ability to appropriately verbalize feelings, Increase emotional regulation, Facilitate acceptance of mental health diagnosis and concerns, Facilitate patient progression through stages of change regarding substance use diagnoses and concerns, Identify triggers associated with mental health/substance abuse issues, and Increase skills for wellness and recovery  Therapeutic Interventions: Assess for all discharge needs, 1 to 1 time with Social worker, Explore available resources and support systems, Assess for adequacy in community support network, Educate family and significant other(s) on suicide prevention, Complete Psychosocial Assessment, Interpersonal group therapy.  Evaluation of Outcomes: Progressing   Progress in Treatment: Attending groups: Yes.  Participating in groups: Yes. Taking medication as prescribed: Yes. Toleration medication: Yes. Family/Significant other contact made: No, will contact:  Patient declined consent  Patient understands  diagnosis: Yes. Discussing patient identified problems/goals with staff: Yes. Medical problems stabilized or resolved: Yes. Denies suicidal/homicidal ideation: No. Issues/concerns per patient self-inventory: Yes. Other: none  New problem(s) identified: No, Describe:  none  New Short Term/Long Term Goal(s): Patient to work towards detox, elimination of symptoms of psychosis, medication management for mood stabilization; elimination of SI thoughts; development of comprehensive mental wellness/sobriety plan.  Patient Goals:  Patient states their goal for treatment is to "Detox and figure out triggers ."  Discharge Plan or Barriers: Patient is homeless, CSW to pursue residential SUD treatment.  Reason for Continuation of Hospitalization: Homicidal ideation Medication stabilization Other; describe psychotic features   Estimated Length of Stay:  Last 3 [place] Suicide Severity Risk Score: Flowsheet Row [facility] (Current) [facility] Most recent reading at 01/13/2023  5:50 PM ED from 01/13/2023 in [facility] at [facility] Most recent reading at 01/13/2023  3:47 AM ED from 02/11/2022 in [facility] Department at [facility] Most recent reading at 02/11/2022  1:11 AM  C-SSRS RISK CATEGORY Moderate Risk High Risk Low Risk       Last PHQ 2/9 Scores:     No data to display          Scribe for Treatment Team: Almedia Balls 01/16/2023 3:41 PM

## 2023-01-16 NOTE — Progress Notes (Deleted)
   01/15/23 1940  Psych Admission Type (Psych Patients Only)  Admission Status Voluntary  Psychosocial Assessment  Patient Complaints Anxiety;Substance abuse  Eye Contact Fair  Facial Expression Anxious  Affect Apathetic  Speech Soft  Interaction Assertive  Motor Activity Other (Comment) (WDL)  Appearance/Hygiene Poor hygiene  Behavior Characteristics Cooperative;Anxious  Mood Anxious  Thought Process  Coherency Circumstantial  Content WDL  Delusions None reported or observed  Perception WDL  Hallucination Auditory  Judgment Impaired  Confusion None  Danger to Self  Current suicidal ideation? Denies  Agreement Not to Harm Self Yes  Description of Agreement Verbal  Danger to Others  Danger to Others None reported or observed

## 2023-01-16 NOTE — [facility] Group Notes (Signed)
Adult Psychoeducational Group Note  Date:  01/16/2023 Time:  9:51 PM  Group Topic/Focus:  Wrap-Up Group:   The focus of this group is to help patients review their daily goal of treatment and discuss progress on daily workbooks.  Participation Level:  Active  Participation Quality:  Appropriate  Affect:  Appropriate  Cognitive:  Appropriate  Insight: Appropriate  Engagement in Group:  Engaged  Modes of Intervention:  Discussion  Additional Comments:   Pt states that he's had a good day and has been more social, called a few of his loved ones and tried to set himself up at a rehab facility. Pt endorsed anxiety at a 3/10 and depression at a 9/10, and pain in his neck and toes. Pt encouraged to tell his nurse.   Vevelyn Pat 01/16/2023, 9:51 PM

## 2023-01-16 NOTE — Progress Notes (Signed)
   01/16/23 1351  Psych Admission Type (Psych Patients Only)  Admission Status Voluntary  Psychosocial Assessment  Patient Complaints Anxiety;Substance abuse  Eye Contact Fair  Facial Expression Anxious  Affect Anxious  Speech Soft  Interaction Assertive  Motor Activity Other (Comment) (WNL)  Appearance/Hygiene Poor hygiene  Behavior Characteristics Agitated;Anxious  Mood Anxious  Thought Process  Coherency Circumstantial;Tangential  Content Blaming others  Delusions None reported or observed  Perception Hallucinations  Hallucination Auditory;Visual  Judgment Impaired  Confusion None  Danger to Self  Current suicidal ideation? Denies  Agreement Not to Harm Self Yes  Description of Agreement verbal  Danger to Others  Danger to Others None reported or observed

## 2023-01-16 NOTE — Plan of Care (Signed)
  Problem: Coping: Goal: Level of anxiety will decrease Outcome: Progressing   Problem: Activity: Goal: Will verbalize the importance of balancing activity with adequate rest periods Outcome: Progressing   Problem: Education: Goal: Knowledge of the prescribed therapeutic regimen will improve Outcome: Progressing

## 2023-01-16 NOTE — Progress Notes (Signed)
   01/15/23 1940  Psych Admission Type (Psych Patients Only)  Admission Status Voluntary  Psychosocial Assessment  Patient Complaints Anxiety;Substance abuse  Eye Contact Fair  Facial Expression Anxious  Affect Anxious  Speech Soft  Interaction Assertive  Motor Activity Other (Comment) (WDL)  Appearance/Hygiene Poor hygiene  Behavior Characteristics Cooperative;Anxious  Mood Anxious  Thought Process  Coherency Circumstantial  Content WDL  Delusions None reported or observed  Perception WDL  Hallucination Auditory  Judgment Impaired  Confusion None  Danger to Self  Current suicidal ideation? Denies  Agreement Not to Harm Self Yes  Description of Agreement Verbal  Danger to Others  Danger to Others None reported or observed

## 2023-01-16 NOTE — [facility] Suicide Risk Assessment (Signed)
[facility] INPATIENT:  Family/Significant Other Suicide Prevention Education  Suicide Prevention Education:  Patient Refusal for Family/Significant Other Suicide Prevention Education: The patient Mark Hammond has refused to provide written consent for family/significant other to be provided Family/Significant Other Suicide Prevention Education during admission and/or prior to discharge.  Physician notified.  Corky Crafts 01/16/2023, 3:46 PM

## 2023-01-16 NOTE — Progress Notes (Signed)
Adult Psychoeducational Group Note  Date:  01/16/2023 Time:  4:35 PM  Group Topic/Focus:  Goals Group:   The focus of this group is to help patients establish daily goals to achieve during treatment and discuss how the patient can incorporate goal setting into their daily lives to aide in recovery. Orientation:   The focus of this group is to educate the patient on the purpose and policies of crisis stabilization and provide a format to answer questions about their admission.  The group details unit policies and expectations of patients while admitted.  Participation Level:  Did Not Attend  Participation Quality:    Affect:    Cognitive:    Insight:   Engagement in Group:    Modes of Intervention:    Additional Comments:  Pt did not attend group.  Sheran Lawless 01/16/2023, 4:35 PM

## 2023-01-16 NOTE — Progress Notes (Signed)
   01/16/23 0602  15 Minute Checks  Location Bedroom  Visual Appearance Calm  Behavior Sleeping  Sleep (Behavioral Health Patients Only)  Calculate sleep? (Click Yes once per 24 hr at 0600 safety check) Yes  Documented sleep last 24 hours 8.75

## 2023-01-16 NOTE — Group Note (Signed)
Recreation Therapy Group Note   Group Topic:Team Building  Group Date: 01/16/2023 Start Time: 1011 End Time: 1054 Facilitators: Joi Leyva-McCall, LRT,CTRS Location: [address] Dayroom   Goal Area(s) Addresses:  Patient will effectively work with peer towards shared goal.  Patient will identify skills used to make activity successful.  Patient will identify how skills used during activity can be used to reach post d/c goals.   Group Description: Straw Bridge. In teams of 3-5, patients were given 15 plastic drinking straws and an equal length of masking tape. Using the materials provided, patients were instructed to build a free standing bridge-like structure to suspend an everyday item (ex: puzzle box) off of the floor or table surface. All materials were required to be used by the team in their design. LRT facilitated post-activity discussion reviewing team process. Patients were encouraged to reflect how the skills used in this activity can be generalized to daily life post discharge.    Affect/Mood: N/A   Participation Level: Did not attend    Clinical Observations/Individualized Feedback:     Plan: Continue to engage patient in RT group sessions 2-3x/week.   Axiel Fjeld-McCall, LRT,CTRS 01/16/2023 1:43 PM

## 2023-01-16 NOTE — Progress Notes (Addendum)
[facility] MD Progress Note  01/16/2023 5:17 PM Mark Hammond  MRN:  [id number] Principal Problem: Schizoaffective disorder Diagnosis: Principal Problem:   Schizoaffective disorder Active Problems:   Cocaine abuse with cocaine-induced disorder   Alcohol use disorder, severe, dependence   Anxiety state   Insomnia  Reason for Admission: Mark Hammond is a 32 yo male with prior mental health diagnoses of paranoid schizophrenia, cocaine use d/o & alcohol use d/o who was taken to the [facility] ER on 4/12 after bystanders called 911 to report that pt was exhibiting concerning and, bizarre behaviors such as drinking water from the potholes on the street. While at the ER,pt reported +SI, denied having a plan, endorsed +AH & alcohol and cocaine use. He was observed to have self inflicted cuts to his abdomen and b/l arms. Pt was transferred and admitted to this [facility] voluntarily for treatment and stabilization of his mental status.   24 hr chart review: Sleep Hours last night: 8.75 hrs as per nursing documentation Nursing Concerns: none  Behavioral episodes in the past 24 hrs: Irritability, agitation, given agitation protocol medications at 0828 earlier today morning. Medication Compliance: compliant  Vital Signs in the past 24 hrs: SBP with some elevations btn 140s and 150s PRN Medications in the past 24 hrs: Haldol, Benadryl, Ativan as per AR.  Patient assessment note: Assessment today remains unchanged from yesterday's assessment, patient continues to state that he is hearing voices, he reports being unsure what the voices are saying, he denies visual hallucinations, endorses SI/HI; denies any intent or plan to harm any one or himself, contracts for safety on the unit, states that he will not harm any one here on the unit or himself. He presents with thought insertion & thought broadcasting; states that other people know what he is thinking, and that he gets special messages from electronic devices, and  people are able to put thoughts into his brain and take some thoughts out of his brain. Pt is observed to be talking to himself when during this encounter, and when asked who he is talking to, he states: "the voices".   He reports a fair sleep quality last night, reports a good appetite, denies being in any physical pain. No TD/EPS type symptoms found on assessment, and pt denies any feelings of stiffness. AIMS: 0. Zyprexa was increased to 10 mg BID yesterday, but psychosis is persistent. 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 We are adding Haldol 5 mg TID for psychosis and mood stabilization, and will continue other medications as listed below.  Total Time spent with patient: 45 minutes  Past Psychiatric History: See H & P  Past Medical History:  Past Medical History:  Diagnosis Date   Alcoholic gastritis    Bipolar 1 disorder    Dental abscess    Depression    Drug abuse    ETOH abuse    Hallucination    Schizo affective schizophrenia    No past surgical history on file. Family History:  Family History  Problem Relation Age of Onset   Stroke Other    Family Psychiatric  History: See H & P Social History:  Social History   Substance and Sexual Activity  Alcohol Use Yes     Social History   Substance and Sexual Activity  Drug Use Yes   Frequency: 7.0 times per week   Types: "Crack" cocaine, Heroin, MDMA (Ecstacy), Marijuana,  Cocaine    Social History   Socioeconomic History   Marital status: Single    Spouse name: Not on file   Number of children: Not on file   Years of education: Not on file   Highest education level: Not on file  Occupational History   Not on file  Tobacco Use   Smoking status: Every Day    Packs/day: 1    Types: Cigarettes   Smokeless tobacco: Never  Vaping Use   Vaping Use: Every day   Substances: THC  Substance and Sexual Activity   Alcohol use:  Yes   Drug use: Yes    Frequency: 7.0 times per week    Types: "Crack" cocaine, Heroin, MDMA (Ecstacy), Marijuana, Cocaine   Sexual activity: Yes  Other Topics Concern   Not on file  Social History Narrative   Pt lives in [place] with ex-girlfriend.  Pt is not followed by an outpatient psychiatric provider.   Social Determinants of Health   Financial Resource Strain: Not on file  Food Insecurity: Food Insecurity Present (01/13/2023)   Hunger Vital Sign    Worried About Running Out of Food in the Last Year: Often true    Ran Out of Food in the Last Year: Sometimes true  Transportation Needs: Unmet Transportation Needs (01/13/2023)   PRAPARE - Administrator, Civil Service (Medical): Yes    Lack of Transportation (Non-Medical): Yes  Physical Activity: Not on file  Stress: Not on file  Social Connections: Not on file   Sleep: Fair  Appetite:  Good  Current Medications: Current Facility-Administered Medications  Medication Dose Route Frequency Provider Last Rate Last Admin   acetaminophen (TYLENOL) tablet 650 mg  650 mg Oral Q6H PRN Motley-Mangrum, Jadeka A, PMHNP   650 mg at 01/15/23 2041   alum & mag hydroxide-simeth (MAALOX/MYLANTA) 200-200-20 MG/5ML suspension 30 mL  30 mL Oral Q4H PRN Motley-Mangrum, Jadeka A, PMHNP       benztropine (COGENTIN) tablet 0.5 mg  0.5 mg Oral BID Iolanda Folson, NP       cholecalciferol (VITAMIN D3) 25 MCG (1000 UNIT) tablet 1,000 Units  1,000 Units Oral Daily Starleen Blue, NP   1,000 Units at 01/15/23 2041   diphenhydrAMINE (BENADRYL) capsule 50 mg  50 mg Oral TID PRN Motley-Mangrum, Jadeka A, PMHNP   50 mg at 01/16/23 1610   Or   diphenhydrAMINE (BENADRYL) injection 50 mg  50 mg Intramuscular TID PRN Motley-Mangrum, Jadeka A, PMHNP       haloperidol (HALDOL) tablet 5 mg  5 mg Oral TID PRN Motley-Mangrum, Geralynn Ochs A, PMHNP   5 mg at 01/16/23 9604   Or   haloperidol lactate (HALDOL) injection 5 mg  5 mg Intramuscular TID PRN  Motley-Mangrum, Jadeka A, PMHNP       haloperidol (HALDOL) tablet 5 mg  5 mg Oral TID WC Shamarie Call, NP       hydrOXYzine (ATARAX) tablet 25 mg  25 mg Oral TID PRN Motley-Mangrum, Jadeka A, PMHNP   25 mg at 01/14/23 5409   loperamide (IMODIUM) capsule 2-4 mg  2-4 mg Oral PRN Starleen Blue, NP       LORazepam (ATIVAN) tablet 2 mg  2 mg Oral TID PRN Motley-Mangrum, Jadeka A, PMHNP       Or   LORazepam (ATIVAN) injection 2 mg  2 mg Intramuscular TID PRN Motley-Mangrum, Jadeka A, PMHNP       [START ON 01/17/2023] LORazepam (ATIVAN) tablet 1 mg  1  mg Oral Daily Yader Criger, NP       magnesium hydroxide (MILK OF MAGNESIA) suspension 30 mL  30 mL Oral Daily PRN Motley-Mangrum, Jadeka A, PMHNP       menthol-cetylpyridinium (CEPACOL) lozenge 3 mg  1 lozenge Oral PRN Grantham Hippert, NP       multivitamin with minerals tablet 1 tablet  1 tablet Oral Daily Motley-Mangrum, Jadeka A, PMHNP   1 tablet at 01/16/23 0828   OLANZapine zydis (ZYPREXA) disintegrating tablet 10 mg  10 mg Oral BID Starleen Blue, NP   10 mg at 01/16/23 1610   thiamine (Vitamin B-1) tablet 100 mg  100 mg Oral Daily Motley-Mangrum, Jadeka A, PMHNP   100 mg at 01/16/23 0827   traZODone (DESYREL) tablet 100 mg  100 mg Oral QHS Starleen Blue, NP   100 mg at 01/15/23 2041    Lab Results:  Results for orders placed or performed during the hospital encounter of 01/13/23 (from the past 48 hour(s))  VITAMIN D 25 Hydroxy (Vit-D Deficiency, Fractures)     Status: Abnormal   Collection Time: 01/15/23  8:04 AM  Result Value Ref Range   Vit D, 25-Hydroxy 26.86 (L) 30 - 100 ng/mL    Comment: (NOTE) Vitamin D deficiency has been defined by the Institute of Medicine  and an Endocrine Society practice guideline as a level of serum 25-OH  vitamin D less than 20 ng/mL (1,2). The Endocrine Society went on to  further define vitamin D insufficiency as a level between 21 and 29  ng/mL (2).  1. IOM (Institute of Medicine). 2010. Dietary  reference intakes for  calcium and D. [place]: [employer]. 2. Holick MF, Binkley [place], Bischoff-Ferrari HA, et al. Evaluation,  treatment, and prevention of vitamin D deficiency: an Endocrine  Society clinical practice guideline, JCEM. 2011 Jul; 96(7): 1911-30.  Performed at [facility], [address] [place], [place] [address]   Comprehensive metabolic panel     Status: Abnormal   Collection Time: 01/15/23  8:04 AM  Result Value Ref Range   Sodium 139 135 - 145 mmol/L   Potassium 3.7 3.5 - 5.1 mmol/L   Chloride 105 98 - 111 mmol/L   CO2 26 22 - 32 mmol/L   Glucose, Bld 126 (H) 70 - 99 mg/dL    Comment: Glucose reference range applies only to samples taken after fasting for at least 8 hours.   BUN 10 6 - 20 mg/dL   Creatinine, Ser 4.09 0.61 - 1.24 mg/dL   Calcium 8.9 8.9 - 81.1 mg/dL   Total Protein 7.8 6.5 - 8.1 g/dL   Albumin 3.7 3.5 - 5.0 g/dL   AST 914 (H) 15 - 41 U/L   ALT 211 (H) 0 - 44 U/L   Alkaline Phosphatase 115 38 - 126 U/L   Total Bilirubin 0.8 0.3 - 1.2 mg/dL   GFR, Estimated >78 >29 mL/min    Comment: (NOTE) Calculated using the CKD-EPI Creatinine Equation (2021)    Anion gap 8 5 - 15    Comment: Performed at [facility], [address] [place], [place] [address]    Blood Alcohol level:  Lab Results  Component Value Date   ETH 163 (H) 01/13/2023   ETH 198 (H) 02/11/2022    Metabolic Disorder Labs: Lab Results  Component Value Date   HGBA1C 5.7 (H) 01/14/2023   MPG 116.89 01/14/2023   MPG 111.15 01/13/2023   Lab Results  Component  Value Date   PROLACTIN 31.4 (H) 11/16/2019   Lab Results  Component Value Date   CHOL 126 01/14/2023   TRIG 65 01/14/2023   HDL 56 01/14/2023   CHOLHDL 2.3 01/14/2023   VLDL 13 01/14/2023   LDLCALC 57 01/14/2023   LDLCALC 58 01/13/2023    Physical Findings: AIMS:  , ,  ,  ,    CIWA:  CIWA-Ar Total: 0 COWS:     Musculoskeletal: Strength & Muscle Tone:  within normal limits Gait & Station: normal Patient leans: N/A  Psychiatric Specialty Exam:  Presentation  General Appearance:  Appropriate for Environment; Fairly Groomed  Eye Contact: Good  Speech: Clear and Coherent  Speech Volume: Normal  Handedness: Right   Mood and Affect  Mood: Depressed; Anxious; Irritable  Affect: Congruent   Thought Process  Thought Processes: Coherent  Descriptions of Associations:Intact  Orientation:Full (Time, Place and Person)  Thought Content:Logical  History of Schizophrenia/Schizoaffective disorder:Yes  Duration of Psychotic Symptoms:Greater than six months  Hallucinations:Hallucinations: Auditory  Ideas of Reference:Paranoia  Suicidal Thoughts:Suicidal Thoughts: No  Homicidal Thoughts:Homicidal Thoughts: No   Sensorium  Memory: Immediate Good  Judgment: Poor  Insight: Poor   Executive Functions  Concentration: Poor  Attention Span: Poor  Recall: Poor  Fund of Knowledge: Poor  Language: Fair   Psychomotor Activity  Psychomotor Activity: Psychomotor Activity: Normal   Assets  Assets: Resilience   Sleep  Sleep: Sleep: Fair    Physical Exam: Physical Exam Constitutional:      Appearance: Normal appearance.  HENT:     Head: Normocephalic.     Nose: Nose normal. No congestion.  Eyes:     Pupils: Pupils are equal, round, and reactive to light.  Pulmonary:     Effort: Pulmonary effort is normal.  Musculoskeletal:        General: Normal range of motion.     Cervical back: Normal range of motion.  Neurological:     Mental Status: He is alert and oriented to person, place, and time.    Review of Systems  Constitutional:  Negative for fever.  HENT:  Negative for hearing loss.   Eyes:  Negative for blurred vision.  Respiratory:  Negative for cough.   Cardiovascular:  Negative for chest pain.  Gastrointestinal:  Negative for heartburn.  Genitourinary:  Negative for dysuria.   Musculoskeletal:  Negative for myalgias.  Skin:  Negative for rash.  Neurological:  Negative for dizziness.  Psychiatric/Behavioral:  Positive for depression, hallucinations and substance abuse. Negative for memory loss and suicidal ideas. The patient is nervous/anxious and has insomnia.    Blood pressure (!) 150/84, pulse 85, temperature 98 F (36.7 C), temperature source Oral, resp. rate 20, height 5\' 4"  (1.626 m), weight 56.5 kg, SpO2 99 %. Body mass index is 21.39 kg/m.  Treatment Plan Summary: Daily contact with patient to assess and evaluate symptoms and progress in treatment and Medication management   Observation Level/Precautions:  15 minute checks  Laboratory:  Labs reviewed: Potassium level on admission was 3.2, ordered 40 mEq x 2 doses, and repeated level and it is now WNL.  LFTs are also elevated, repeated to see and trending downwards.  This is most likely related to chronic alcoholism.  Psychotherapy:  Unit Group sessions  Medications:  See [facility]  Consultations:  To be determined   Discharge Concerns:  Safety, medication compliance, mood stability  Estimated LOS: 5-7 days  Other:  N/A    PLAN Safety and Monitoring: Voluntary admission to inpatient  psychiatric unit for safety, stabilization and treatment Daily contact with patient to assess and evaluate symptoms and progress in treatment Patient's case to be discussed in multi-disciplinary team meeting Observation Level : q15 minute checks Vital signs: q12 hours Precautions: Safety    Long Term Goal(s): Improvement in symptoms so as ready for discharge   Short Term Goals: Ability to identify changes in lifestyle to reduce recurrence of condition will improve, Ability to verbalize feelings will improve, Ability to disclose and discuss suicidal ideas, Ability to demonstrate self-control will improve, Ability to identify and develop effective coping behaviors will improve, Ability to maintain clinical measurements within  normal limits will improve, Compliance with prescribed medications will improve, and Ability to identify triggers associated with substance abuse/mental health issues will improve   Diagnoses:  Principal Problem:   Schizoaffective disorder Active Problems:   Cocaine abuse with cocaine-induced disorder   Alcohol use disorder, severe, dependence   Anxiety state   Insomnia   Medications -Start Haldol 5 mg TID for psychosis  -Start Cogentin 0.5 mg BID for EPS prophylaxis -Continue Zyprexa to 10 mg BID for psychosis/mood stabilization  -Continue Vitamin D3 daily for vit D deficiency -Continue Ativan detox protocol as per Mar with [place] every 6 hours-See the mar for complete order -Continue trazodone 100 mg nightly for sleep -Continue agitation protocol: Ativan/Haldol/Benadryl every 6 hours as needed-See AR for complete order    PRNS -Continue Hydroxyzine 25 mg every 6 hours PRN -Continue Tylenol 650 mg every 6 hours PRN for mild pain -Continue Maalox 30 mg every 4 hrs PRN for indigestion -Continue Imodium 2-4 mg as needed for diarrhea -Continue Milk of Magnesia as needed every 6 hrs for constipation -Continue Zofran disintegrating tabs every 6 hrs PRN for nausea   Discharge Planning: Social work and case management to assist with discharge planning and identification of hospital follow-up needs prior to discharge Estimated LOS: 5-7 days Discharge Concerns: Need to establish a safety plan; Medication compliance and effectiveness Discharge Goals: Return home with outpatient referrals for mental health follow-up including medication management/psychotherapy    Starleen Blue, NP 01/16/2023, 5:17 PMPatient ID: Mark Hammond, male   DOB: [birth year], 32 y.o.   MRN: [id number]

## 2023-01-17 ENCOUNTER — Encounter (HOSPITAL_COMMUNITY): Payer: Self-pay | Admitting: Psychiatry

## 2023-01-17 DIAGNOSIS — F259 Schizoaffective disorder, unspecified: Secondary | ICD-10-CM | POA: Diagnosis not present

## 2023-01-17 DIAGNOSIS — F250 Schizoaffective disorder, bipolar type: Secondary | ICD-10-CM | POA: Diagnosis not present

## 2023-01-17 MED ORDER — ENSURE ENLIVE PO LIQD
237.0000 mL | Freq: Two times a day (BID) | ORAL | Status: DC
  Administered 2023-01-17 – 2023-01-22 (×10): 237 mL via ORAL
  Filled 2023-01-17 (×16): qty 237

## 2023-01-17 NOTE — Progress Notes (Signed)
Adult Psychoeducational Group Note  Date:  01/17/2023 Time:  9:46 AM  Group Topic/Focus:  Goals Group:   The focus of this group is to help patients establish daily goals to achieve during treatment and discuss how the patient can incorporate goal setting into their daily lives to aide in recovery. Orientation:   The focus of this group is to educate the patient on the purpose and policies of crisis stabilization and provide a format to answer questions about their admission.  The group details unit policies and expectations of patients while admitted.  Participation Level:  Active  Participation Quality:  Appropriate  Affect:  Appropriate  Cognitive:  Appropriate  Insight: Appropriate  Engagement in Group:  Engaged  Modes of Intervention:  Discussion  Additional Comments:  Pt attended the goals/orientation group and remained appropriate and engaged throughout the duration of the group.   Sheran Lawless 01/17/2023, 9:46 AM

## 2023-01-17 NOTE — Progress Notes (Signed)
[facility] MD Progress Note  01/17/2023 7:13 PM Mandeep Kiser  MRN:  [id number] Principal Problem: Schizoaffective disorder Diagnosis: Principal Problem:   Schizoaffective disorder Active Problems:   Cocaine abuse with cocaine-induced disorder   Alcohol use disorder, severe, dependence   Anxiety state   Insomnia  Reason for Admission: Jayko Voorhees is a 32 yo male with prior mental health diagnoses of paranoid schizophrenia, cocaine use d/o & alcohol use d/o who was taken to the [facility] ER on 4/12 after bystanders called 911 to report that pt was exhibiting concerning and, bizarre behaviors such as drinking water from the potholes on the street. While at the ER,pt reported +SI, denied having a plan, endorsed +AH & alcohol and cocaine use. He was observed to have self inflicted cuts to his abdomen and b/l arms. Pt was transferred and admitted to this [facility] voluntarily for treatment and stabilization of his mental status.   24 hr chart review: Sleep Hours last night: none documented Nursing Concerns: none  Behavioral episodes in the past 24 hrs: Irritability, this evening with some paranoia, medicated with Hydroxyzine 25 mg for anxiety Medication Compliance: compliant  Vital Signs in the past 24 hrs: WNL PRN Medications in the past 24 hrs: Haldol, Benadryl.  Patient assessment note: Today, pt is continues to present with psychosis, but to a lesser extent as compared to yesterday. He has been calmer overall and less irritable as compared to the past couple of days. He continues to present with some illogical thoughts, but is less labile. He reports that the last time he had auditory hallucinations was earlier today morning, and states the voice was saying "Get some rest". He is not observed to be responding to some stimuli as much as he was yesterday, even though occasionally, he is still observed to be talking to himself during assessment. He continues to report that he gets special messages from  electronics even though he is not clear on what types of messages he gets from [employer], dispensing.   Pt reports a good appetite, reports a poor sleep quality last night, denies medication related side effects, reports having a sore throat, but has Cepacol lozenges ordered.   Patient was initially on one antipsychotic (Zyprexa), which was on its own, ineffective in management of his psychosis, and it became necessary to add a second antipsychotic yesterday (Haldol 5 mg TID). We weill further increase Haldol tomorrow morning to 10 mg BID for management of psychosis and mood stabilization, and will continue other medications as listed below. No TD/EPS type symptoms found on assessment, and pt denies any feelings of stiffness. AIMS: 0.   Total Time spent with patient: 45 minutes  Past Psychiatric History: See H & P  Past Medical History:  Past Medical History:  Diagnosis Date   Alcoholic gastritis    Bipolar 1 disorder    Dental abscess    Depression    Drug abuse    ETOH abuse    Hallucination    Schizo affective schizophrenia    No past surgical history on file. Family History:  Family History  Problem Relation Age of Onset   Stroke Other    Family Psychiatric  History: See H & P Social History:  Social History   Substance and Sexual Activity  Alcohol Use Yes   Comment: 4x 40oz beers daily     Social History   Substance and Sexual Activity  Drug Use Yes   Frequency: 7.0 times per week   Types: "Crack" cocaine,  Heroin, MDMA (Ecstacy), Marijuana, Cocaine   Comment: occasional stimulant use    Social History   Socioeconomic History   Marital status: Single    Spouse name: Not on file   Number of children: Not on file   Years of education: Not on file   Highest education level: Not on file  Occupational History   Not on file  Tobacco Use   Smoking status: Every Day    Packs/day: 1    Types: Cigarettes   Smokeless tobacco: Never  Vaping Use   Vaping Use: Every day    Substances: THC  Substance and Sexual Activity   Alcohol use: Yes    Comment: 4x 40oz beers daily   Drug use: Yes    Frequency: 7.0 times per week    Types: "Crack" cocaine, Heroin, MDMA (Ecstacy), Marijuana, Cocaine    Comment: occasional stimulant use   Sexual activity: Yes  Other Topics Concern   Not on file  Social History Narrative   Pt lives in [place] with ex-girlfriend.  Pt is not followed by an outpatient psychiatric provider.   Social Determinants of Health   Financial Resource Strain: Not on file  Food Insecurity: Food Insecurity Present (01/13/2023)   Hunger Vital Sign    Worried About Running Out of Food in the Last Year: Often true    Ran Out of Food in the Last Year: Sometimes true  Transportation Needs: Unmet Transportation Needs (01/13/2023)   PRAPARE - Administrator, Civil Service (Medical): Yes    Lack of Transportation (Non-Medical): Yes  Physical Activity: Not on file  Stress: Not on file  Social Connections: Not on file   Sleep: Fair  Appetite:  Good  Current Medications: Current Facility-Administered Medications  Medication Dose Route Frequency Provider Last Rate Last Admin   acetaminophen (TYLENOL) tablet 650 mg  650 mg Oral Q6H PRN Motley-Mangrum, Jadeka A, PMHNP   650 mg at 01/17/23 1306   alum & mag hydroxide-simeth (MAALOX/MYLANTA) 200-200-20 MG/5ML suspension 30 mL  30 mL Oral Q4H PRN Motley-Mangrum, Jadeka A, PMHNP       benztropine (COGENTIN) tablet 0.5 mg  0.5 mg Oral BID Lendell Gallick, NP   0.5 mg at 01/17/23 1610   cholecalciferol (VITAMIN D3) 25 MCG (1000 UNIT) tablet 1,000 Units  1,000 Units Oral Daily Kairav Russomanno, Tyler Aas, NP   1,000 Units at 01/16/23 2051   diphenhydrAMINE (BENADRYL) capsule 50 mg  50 mg Oral TID PRN Motley-Mangrum, Jadeka A, PMHNP   50 mg at 01/16/23 9604   Or   diphenhydrAMINE (BENADRYL) injection 50 mg  50 mg Intramuscular TID PRN Motley-Mangrum, Jadeka A, PMHNP       feeding supplement (ENSURE ENLIVE /  ENSURE PLUS) liquid 237 mL  237 mL Oral BID BM Goli, Veeraindar, MD   237 mL at 01/17/23 1532   haloperidol (HALDOL) tablet 5 mg  5 mg Oral TID PRN Motley-Mangrum, Jadeka A, PMHNP   5 mg at 01/16/23 5409   Or   haloperidol lactate (HALDOL) injection 5 mg  5 mg Intramuscular TID PRN Motley-Mangrum, Jadeka A, PMHNP       haloperidol (HALDOL) tablet 5 mg  5 mg Oral TID WC Sallie Maker, NP   5 mg at 01/17/23 1532   hydrOXYzine (ATARAX) tablet 25 mg  25 mg Oral TID PRN Motley-Mangrum, Jadeka A, PMHNP   25 mg at 01/17/23 1746   LORazepam (ATIVAN) tablet 2 mg  2 mg Oral TID PRN Motley-Mangrum, Ezra Sites, PMHNP  Or   LORazepam (ATIVAN) injection 2 mg  2 mg Intramuscular TID PRN Motley-Mangrum, Jadeka A, PMHNP       magnesium hydroxide (MILK OF MAGNESIA) suspension 30 mL  30 mL Oral Daily PRN Motley-Mangrum, Jadeka A, PMHNP       menthol-cetylpyridinium (CEPACOL) lozenge 3 mg  1 lozenge Oral PRN Loden Laurent, NP       multivitamin with minerals tablet 1 tablet  1 tablet Oral Daily Motley-Mangrum, Jadeka A, PMHNP   1 tablet at 01/17/23 0812   OLANZapine zydis (ZYPREXA) disintegrating tablet 10 mg  10 mg Oral BID Starleen Blue, NP   10 mg at 01/17/23 1610   thiamine (Vitamin B-1) tablet 100 mg  100 mg Oral Daily Motley-Mangrum, Jadeka A, PMHNP   100 mg at 01/17/23 0811   traZODone (DESYREL) tablet 100 mg  100 mg Oral QHS Starleen Blue, NP   100 mg at 01/16/23 2051    Lab Results:  No results found for this or any previous visit (from the past 48 hour(s)).   Blood Alcohol level:  Lab Results  Component Value Date   ETH 163 (H) 01/13/2023   ETH 198 (H) 02/11/2022    Metabolic Disorder Labs: Lab Results  Component Value Date   HGBA1C 5.7 (H) 01/14/2023   MPG 116.89 01/14/2023   MPG 111.15 01/13/2023   Lab Results  Component Value Date   PROLACTIN 31.4 (H) 11/16/2019   Lab Results  Component Value Date   CHOL 126 01/14/2023   TRIG 65 01/14/2023   HDL 56 01/14/2023   CHOLHDL 2.3  01/14/2023   VLDL 13 01/14/2023   LDLCALC 57 01/14/2023   LDLCALC 58 01/13/2023    Physical Findings: AIMS: Facial and Oral Movements Muscles of Facial Expression: None, normal Lips and Perioral Area: None, normal Jaw: None, normal Tongue: None, normal,Extremity Movements Upper (arms, wrists, hands, fingers): None, normal Lower (legs, knees, ankles, toes): None, normal, Trunk Movements Neck, shoulders, hips: None, normal, Overall Severity Severity of abnormal movements (highest score from questions above): None, normal Incapacitation due to abnormal movements: None, normal Patient's awareness of abnormal movements (rate only patient's report): No Awareness, Dental Status Current problems with teeth and/or dentures?: No Does patient usually wear dentures?: No  CIWA:  CIWA-Ar Total: 1 COWS:     Musculoskeletal: Strength & Muscle Tone: within normal limits Gait & Station: normal Patient leans: N/A  Psychiatric Specialty Exam:  Presentation  General Appearance:  Disheveled  Eye Contact: Fair  Speech: Clear and Coherent  Speech Volume: Normal  Handedness: Right   Mood and Affect  Mood: Depressed; Anxious  Affect: Congruent   Thought Process  Thought Processes: Coherent  Descriptions of Associations:Intact  Orientation:Partial  Thought Content:Illogical; Paranoid Ideation  History of Schizophrenia/Schizoaffective disorder:Yes  Duration of Psychotic Symptoms:Greater than six months  Hallucinations:Hallucinations: None   Ideas of Reference:Paranoia; Delusions; Percusatory  Suicidal Thoughts:Suicidal Thoughts: No   Homicidal Thoughts:Homicidal Thoughts: No    Sensorium  Memory: Recent Poor; Remote Poor  Judgment: Poor  Insight: Poor   Executive Functions  Concentration: Poor  Attention Span: Poor  Recall: Poor  Fund of Knowledge: Poor  Language: Fair   Psychomotor Activity  Psychomotor Activity: Psychomotor  Activity: Normal    Assets  Assets: Resilience   Sleep  Sleep: Sleep: Fair     Physical Exam: Physical Exam Constitutional:      Appearance: Normal appearance.  HENT:     Head: Normocephalic.     Nose: Nose normal. No congestion.  Eyes:     Pupils: Pupils are equal, round, and reactive to light.  Pulmonary:     Effort: Pulmonary effort is normal.  Musculoskeletal:        General: Normal range of motion.     Cervical back: Normal range of motion.  Neurological:     Mental Status: He is alert and oriented to person, place, and time.    Review of Systems  Constitutional:  Negative for fever.  HENT:  Negative for hearing loss.   Eyes:  Negative for blurred vision.  Respiratory:  Negative for cough.   Cardiovascular:  Negative for chest pain.  Gastrointestinal:  Negative for heartburn.  Genitourinary:  Negative for dysuria.  Musculoskeletal:  Negative for myalgias.  Skin:  Negative for rash.  Neurological:  Negative for dizziness.  Psychiatric/Behavioral:  Positive for depression, hallucinations and substance abuse. Negative for memory loss and suicidal ideas. The patient is nervous/anxious and has insomnia.    Blood pressure 127/80, pulse 84, temperature 98.3 F (36.8 C), temperature source Oral, resp. rate 18, height 5\' 4"  (1.626 m), weight 56.5 kg, SpO2 100 %. Body mass index is 21.39 kg/m.  Treatment Plan Summary: Daily contact with patient to assess and evaluate symptoms and progress in treatment and Medication management   Observation Level/Precautions:  15 minute checks  Laboratory:  Labs reviewed: Potassium level on admission was 3.2, ordered 40 mEq x 2 doses, and repeated level and it is now WNL.  LFTs are also elevated, repeated to see and trending downwards.  This is most likely related to chronic alcoholism.  Psychotherapy:  Unit Group sessions  Medications:  See [facility]  Consultations:  To be determined   Discharge Concerns:  Safety, medication  compliance, mood stability  Estimated LOS: 5-7 days  Other:  N/A    PLAN Safety and Monitoring: Voluntary admission to inpatient psychiatric unit for safety, stabilization and treatment Daily contact with patient to assess and evaluate symptoms and progress in treatment Patient's case to be discussed in multi-disciplinary team meeting Observation Level : q15 minute checks Vital signs: q12 hours Precautions: Safety    Long Term Goal(s): Improvement in symptoms so as ready for discharge   Short Term Goals: Ability to identify changes in lifestyle to reduce recurrence of condition will improve, Ability to verbalize feelings will improve, Ability to disclose and discuss suicidal ideas, Ability to demonstrate self-control will improve, Ability to identify and develop effective coping behaviors will improve, Ability to maintain clinical measurements within normal limits will improve, Compliance with prescribed medications will improve, and Ability to identify triggers associated with substance abuse/mental health issues will improve   Diagnoses:  Principal Problem:   Schizoaffective disorder Active Problems:   Cocaine abuse with cocaine-induced disorder   Alcohol use disorder, severe, dependence   Anxiety state   Insomnia   Medications -Increase Haldol to 10 mg BID for psychosis  -Continue Cogentin 0.5 mg BID for EPS prophylaxis -Continue Zyprexa to 10 mg BID for psychosis/mood stabilization  -Continue Vitamin D3 daily for vit D deficiency -Continue Ativan detox protocol as per Mar with Ciwas every 6 hours-See the mar for complete order -Continue trazodone 100 mg nightly for sleep -Continue agitation protocol: Ativan/Haldol/Benadryl every 6 hours as needed-See AR for complete order    PRNS -Continue Hydroxyzine 25 mg every 6 hours PRN -Continue Tylenol 650 mg every 6 hours PRN for mild pain -Continue Maalox 30 mg every 4 hrs PRN for indigestion -Continue Imodium 2-4 mg as needed for  diarrhea -  Continue Milk of Magnesia as needed every 6 hrs for constipation -Continue Zofran disintegrating tabs every 6 hrs PRN for nausea   Discharge Planning: Social work and case management to assist with discharge planning and identification of hospital follow-up needs prior to discharge Estimated LOS: 5-7 days Discharge Concerns: Need to establish a safety plan; Medication compliance and effectiveness Discharge Goals: Return home with outpatient referrals for mental health follow-up including medication management/psychotherapy   Starleen Blue, NP 01/17/2023, 7:13 PMPatient ID: [name], male   DOB: Feb 07, 1991, 32 y.o.   MRN: [id number] Patient ID: Jhonatan Lomeli, male   DOB: June 06, 1991, 32 y.o.   MRN: [id number]

## 2023-01-17 NOTE — Progress Notes (Signed)
NUTRITION ASSESSMENT  Pt identified as at risk on the Malnutrition Screen Tool  INTERVENTION: 1. Supplements: Ensure Plus High Protein po BID, each supplement provides 350 kcal and 20 grams of protein.   NUTRITION DIAGNOSIS: Unintentional weight loss related to sub-optimal intake as evidenced by pt report.   Goal: Pt to meet >/= 90% of their estimated nutrition needs.  Monitor:  PO intake  Assessment:  Pt admitted for psychosis, depression and substance abuse (cocaine, alcohol, heroin).  UDS+ amphetamines, cocaine and THC.  Patient receiving Vitamin D d/t deficiency. Suspect poor quality diet PTA given substance abuse. On a daily MVI and thiamine. Per weight records, pt has lost 30 lbs since 02/11/22 (19% wt loss x 11 months, significant for time frame). Will order Ensure supplements given significant weight loss.   Height: Ht Readings from Last 1 Encounters:  01/13/23  (1.626 m)    Weight: Wt Readings from Last 1 Encounters:  01/13/23 56.5 kg    Weight Hx: Wt Readings from Last 10 Encounters:  01/13/23 56.5 kg  01/13/23 59 kg  02/11/22 70.3 kg  11/07/19 61.2 kg  10/28/19 60.1 kg  10/07/19 62.6 kg  08/24/19 65.8 kg  03/18/19 59 kg  03/11/19 61.2 kg  03/06/19 61.2 kg    BMI:  Body mass index is 21.39 kg/m. Pt meets criteria for normal based on current BMI.  Estimated Nutritional Needs: Kcal: 25-30 kcal/kg Protein: > 1 gram protein/kg Fluid: 1 ml/kcal  Diet Order:  Diet Order             Diet regular Room service appropriate? Yes; Fluid consistency: Thin  Diet effective now                  Pt is also offered choice of unit snacks mid-morning and mid-afternoon.     Lab results and medications reviewed.   Tilda Franco, MS, RD, LDN Inpatient Clinical Dietitian Contact information available via Amion

## 2023-01-17 NOTE — Group Note (Signed)
Recreation Therapy Group Note   Group Topic:Self-Esteem  Group Date: 01/17/2023 Start Time: 1039 End Time: 1140 Facilitators: Natalya Domzalski-McCall, LRT,CTRS Location: [address] Dayroom   Goal Area(s) Addresses:  Patient will be able to define self esteem. Patient will successfully share how they see themselves. Patient will identify how self esteem can be helpful post d/c.     Group Description:  Mask Off.  LRT and patients discussed the importance of having a positive outlook and overview of ones self.  LRT and patients also discussed how the opinions of others can affect how we feel about ourselves.  Patients were then given blank faces of their choice.  Patients were to create and decorate the mask how they see themselves.  Patients were to highlight all the positive qualities using words and images they draw.    Affect/Mood: N/A   Participation Level: Did not attend    Clinical Observations/Individualized Feedback:     Plan: Continue to engage patient in RT group sessions 2-3x/week.   Sheleen Conchas-McCall, LRT,CTRS  01/17/2023 1:42 PM

## 2023-01-17 NOTE — Progress Notes (Signed)
   01/17/23 2015  Psych Admission Type (Psych Patients Only)  Admission Status Voluntary  Psychosocial Assessment  Patient Complaints None  Eye Contact Fair  Facial Expression Other (Comment) (unremarkable)  Affect Appropriate to circumstance  Speech Logical/coherent  Interaction Assertive  Motor Activity Other (Comment) (WDL)  Appearance/Hygiene In scrubs  Behavior Characteristics Cooperative  Mood Pleasant  Thought Process  Coherency WDL  Content WDL  Delusions None reported or observed  Perception WDL  Hallucination None reported or observed  Judgment Poor  Confusion None  Danger to Self  Current suicidal ideation? Denies  Agreement Not to Harm Self Yes  Description of Agreement verbal  Danger to Others  Danger to Others None reported or observed   Pt pleasant upon assessment. Denies SI/HI/AVH. Reports AVH on dayshift but denies AVH currently. Denies SI/HI.

## 2023-01-17 NOTE — Group Note (Signed)
Date:  01/17/2023 Time:  8:51 PM  Group Topic/Focus:  Wrap-Up Group:   The focus of this group is to help patients review their daily goal of treatment and discuss progress on daily workbooks.    Participation Level:  Active  Participation Quality:  Appropriate  Affect:  Appropriate  Cognitive:  Appropriate  Insight: Appropriate  Engagement in Group:  Engaged  Modes of Intervention:  Education and Exploration  Additional Comments:  Patient attended and participated in group tonight. He reports that today was significant for him because he realized that he has a support system here and also when he leave from here.  Lita Mains [facility] 01/17/2023, 8:51 PM
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
## 2023-01-17 NOTE — Progress Notes (Signed)
Pt visible in milieu at intervals during shift. Presents animated but fidgety, anxious on interactions. Denies SI, HI and AH on initial assessment "I'm fine right now but last night I saw a tall shadow figure in my room". Out of his room this evening, paranoid and tearful "I'm having all these negative thoughts about death. I feel like people are about to kill me. The devil is messing with my head". PRN Vistaril 50 mg given at 1746 for anxiety with desired effect when reassessed at 1845. Pt also received PRN Tylenol 650 mg PO earlier this shift at 1306 for complaints of headache 6/10 and he reported relief when reassessed. Emotional support, encouragement and reassurance offered. Safety checks maintained at Q 15 minutes intervals without incident. Pt educated and updated on current treatment regimen. Tolerates meals, medications and fluids well. Remains safe on and off milieu.

## 2023-01-18 DIAGNOSIS — F250 Schizoaffective disorder, bipolar type: Secondary | ICD-10-CM | POA: Diagnosis not present

## 2023-01-18 DIAGNOSIS — F259 Schizoaffective disorder, unspecified: Secondary | ICD-10-CM | POA: Diagnosis not present

## 2023-01-18 MED ORDER — HALOPERIDOL LACTATE 2 MG/ML PO CONC
10.0000 mg | Freq: Two times a day (BID) | ORAL | Status: DC
  Administered 2023-01-18 – 2023-01-23 (×10): 10 mg via ORAL
  Filled 2023-01-18 (×14): qty 5

## 2023-01-18 MED ORDER — TRAZODONE HCL 150 MG PO TABS
150.0000 mg | ORAL_TABLET | Freq: Every day | ORAL | Status: DC
  Administered 2023-01-18 – 2023-01-22 (×5): 150 mg via ORAL
  Filled 2023-01-18 (×8): qty 1

## 2023-01-18 MED ORDER — HALOPERIDOL LACTATE 2 MG/ML PO CONC
5.0000 mg | Freq: Once | ORAL | Status: AC
  Administered 2023-01-18: 5 mg via ORAL
  Filled 2023-01-18: qty 5

## 2023-01-18 MED ORDER — HALOPERIDOL 5 MG PO TABS
10.0000 mg | ORAL_TABLET | Freq: Two times a day (BID) | ORAL | Status: DC
  Filled 2023-01-18 (×3): qty 2

## 2023-01-18 NOTE — Group Note (Signed)
Recreation Therapy Group Note   Group Topic:Personal Development  Group Date: 01/18/2023 Start Time: 1011 End Time: 1051 Facilitators: Uchechukwu Dhawan-McCall, LRT,CTRS Location: [address] Dayroom   Goal Area(s) Addresses:  Patient will successfully identify positive attributes about themselves.  Patient will identify healthy ways to increase personal development. Patient will acknowledge benefit(s) of developing their character.   Group Description:  [place].  LRT and patients played a game in the same style as [place].  Except in this game, each block was a different color (red, orange, green and blue).  Each colored corresponds with a question.  Which ever color patient picked, they were asked a question that matched the color.    Affect/Mood: N/A   Participation Level: Did not attend    Clinical Observations/Individualized Feedback:    Plan: Continue to engage patient in RT group sessions 2-3x/week.   Sharay Bellissimo-McCall, LRT,CTRS 01/18/2023 1:10 PM

## 2023-01-18 NOTE — Group Note (Signed)
Date:  01/18/2023 Time:  9:21 AM  Group Topic/Focus:  Goals Group:   The focus of this group is to help patients establish daily goals to achieve during treatment and discuss how the patient can incorporate goal setting into their daily lives to aide in recovery. Orientation:   The focus of this group is to educate the patient on the purpose and policies of crisis stabilization and provide a format to answer questions about their admission.  The group details unit policies and expectations of patients while admitted.    Participation Level:  Did Not Attend   Donell Beers 01/18/2023, 9:21 AM

## 2023-01-18 NOTE — Progress Notes (Signed)
[facility] [place] MD Progress Note  01/18/2023 5:46 PM Mark Hammond  MRN:  [id number] Principal Problem: Schizoaffective disorder Diagnosis: Principal Problem:   Schizoaffective disorder Active Problems:   Cocaine abuse with cocaine-induced disorder   Alcohol use disorder, severe, dependence   Anxiety state   Insomnia  Reason for Admission: Mark Hammond is a 32 yo male with prior mental health diagnoses of paranoid schizophrenia, cocaine use d/o & alcohol use d/o who was taken to the [facility] ER on 4/12 after bystanders called 911 to report that pt was exhibiting concerning and, bizarre behaviors such as drinking water from the potholes on the street. While at the ER,pt reported +SI, denied having a plan, endorsed +AH & alcohol and cocaine use. He was observed to have self inflicted cuts to his abdomen and b/l arms. Pt was transferred and admitted to this [facility] voluntarily for treatment and stabilization of his mental status.   24 hr chart review: Sleep Hours last night: none documented, reports poor sleep Nursing Concerns: reports of irritability yesterday evening  Behavioral episodes in the past 24 hrs: Irritability yesterday evening with some paranoia, medicated with Hydroxyzine 25 mg for anxiety Medication Compliance: compliant  Vital Signs in the past 24 hrs: WNL PRN Medications in the past 24 hrs: Haldol, Benadryl, Ativan.  Patient assessment note: Overall presentation today is much better than sayings admission, but patient continues to present with psychosis; he is reporting auditory hallucinations, stated earlier today morning that he was hearing voices, unsure what they were saying, also verbalized that he was having visual hallucinations looked outside of the window and stated that he was seeing numbers in the trees.  Patient also verbalized homicidal ideations towards the maintenance people that were coming to his room to take out the malfunctioning air conditioning unit.  He was able to  verbally contract for safety on the unit.  He denied SI, but also continued to report feeling paranoid, stated that he felt as though random people were out to hurt him.  He reported that his sleep quality last night was poor, reports a good appetite, denies medication related side effects, other than a dry mouth.  Reports that he is moving his bowels well, with the last bowel movement being earlier today morning.  Patient's assigned RN provided him with a pitcher full of water and he was encouraged to come for refills.  Vaseline also provided for him to use and he sleeps.   No TD/EPS type symptoms found on assessment, and pt denies any feelings of stiffness. AIMS: 0.  Patient's Haldol increased to 10 mg twice daily, and switched to Haldol solution for concerns of cheeking.  We are also continuing Zyprexa 10 mg twice daily.  Increase trazodone to 150 mg nightly for sleep.we will continue to monitor.  Patient has been denied into it with the [facility] treatment center due to persistent psychosis.  He has indicated that he has somebody with whom he can stay.  We will revisit discharge planning tomorrow with treatment team.  Total Time spent with patient: 45 minutes  Past Psychiatric History: See H & P  Past Medical History:  Past Medical History:  Diagnosis Date   Alcoholic gastritis    Bipolar 1 disorder    Dental abscess    Depression    Drug abuse    ETOH abuse    Hallucination    Schizo affective schizophrenia    No past surgical history on file. Family History:  Family History  Problem Relation  Age of Onset   Stroke Other    Family Psychiatric  History: See H & P Social History:  Social History   Substance and Sexual Activity  Alcohol Use Yes   Comment: 4x 40oz beers daily     Social History   Substance and Sexual Activity  Drug Use Yes   Frequency: 7.0 times per week   Types: "Crack" cocaine, Heroin, MDMA (Ecstacy), Marijuana, Cocaine   Comment: occasional stimulant use     Social History   Socioeconomic History   Marital status: Single    Spouse name: Not on file   Number of children: Not on file   Years of education: Not on file   Highest education level: Not on file  Occupational History   Not on file  Tobacco Use   Smoking status: Every Day    Packs/day: 1    Types: Cigarettes   Smokeless tobacco: Never  Vaping Use   Vaping Use: Every day   Substances: THC  Substance and Sexual Activity   Alcohol use: Yes    Comment: 4x 40oz beers daily   Drug use: Yes    Frequency: 7.0 times per week    Types: "Crack" cocaine, Heroin, MDMA (Ecstacy), Marijuana, Cocaine    Comment: occasional stimulant use   Sexual activity: Yes  Other Topics Concern   Not on file  Social History Narrative   Pt lives in [place] with ex-girlfriend.  Pt is not followed by an outpatient psychiatric provider.   Social Determinants of Health   Financial Resource Strain: Not on file  Food Insecurity: Food Insecurity Present (01/13/2023)   Hunger Vital Sign    Worried About Running Out of Food in the Last Year: Often true    Ran Out of Food in the Last Year: Sometimes true  Transportation Needs: Unmet Transportation Needs (01/13/2023)   PRAPARE - Administrator, Civil Service (Medical): Yes    Lack of Transportation (Non-Medical): Yes  Physical Activity: Not on file  Stress: Not on file  Social Connections: Not on file   Sleep: Fair  Appetite:  Good  Current Medications: Current Facility-Administered Medications  Medication Dose Route Frequency Provider Last Rate Last Admin   acetaminophen (TYLENOL) tablet 650 mg  650 mg Oral Q6H PRN Motley-Mangrum, Jadeka A, PMHNP   650 mg at 01/17/23 1306   alum & mag hydroxide-simeth (MAALOX/MYLANTA) 200-200-20 MG/5ML suspension 30 mL  30 mL Oral Q4H PRN Motley-Mangrum, Jadeka A, PMHNP       benztropine (COGENTIN) tablet 0.5 mg  0.5 mg Oral BID Starleen Blue, NP   0.5 mg at 01/18/23 0804   cholecalciferol  (VITAMIN D3) 25 MCG (1000 UNIT) tablet 1,000 Units  1,000 Units Oral Daily Starleen Blue, NP   1,000 Units at 01/17/23 2050   diphenhydrAMINE (BENADRYL) capsule 50 mg  50 mg Oral TID PRN Motley-Mangrum, Jadeka A, PMHNP   50 mg at 01/18/23 1105   Or   diphenhydrAMINE (BENADRYL) injection 50 mg  50 mg Intramuscular TID PRN Motley-Mangrum, Jadeka A, PMHNP       feeding supplement (ENSURE ENLIVE / ENSURE PLUS) liquid 237 mL  237 mL Oral BID BM Goli, Veeraindar, MD   237 mL at 01/18/23 1436   haloperidol (HALDOL) 2 MG/ML solution 10 mg  10 mg Oral BID Jamara Vary, NP       haloperidol (HALDOL) tablet 5 mg  5 mg Oral TID PRN Motley-Mangrum, Jadeka A, PMHNP   5 mg at 01/18/23 1105  Or   haloperidol lactate (HALDOL) injection 5 mg  5 mg Intramuscular TID PRN Motley-Mangrum, Geralynn Ochs A, PMHNP       hydrOXYzine (ATARAX) tablet 25 mg  25 mg Oral TID PRN Motley-Mangrum, Geralynn Ochs A, PMHNP   25 mg at 01/17/23 1746   LORazepam (ATIVAN) tablet 2 mg  2 mg Oral TID PRN Motley-Mangrum, Geralynn Ochs A, PMHNP   2 mg at 01/18/23 1105   Or   LORazepam (ATIVAN) injection 2 mg  2 mg Intramuscular TID PRN Motley-Mangrum, Jadeka A, PMHNP       magnesium hydroxide (MILK OF MAGNESIA) suspension 30 mL  30 mL Oral Daily PRN Motley-Mangrum, Jadeka A, PMHNP       menthol-cetylpyridinium (CEPACOL) lozenge 3 mg  1 lozenge Oral PRN Avion Patella, NP       multivitamin with minerals tablet 1 tablet  1 tablet Oral Daily Motley-Mangrum, Jadeka A, PMHNP   1 tablet at 01/18/23 0804   OLANZapine zydis (ZYPREXA) disintegrating tablet 10 mg  10 mg Oral BID Starleen Blue, NP   10 mg at 01/18/23 0804   thiamine (Vitamin B-1) tablet 100 mg  100 mg Oral Daily Motley-Mangrum, Jadeka A, PMHNP   100 mg at 01/18/23 0804   traZODone (DESYREL) tablet 150 mg  150 mg Oral QHS Alva Broxson, NP        Lab Results:  No results found for this or any previous visit (from the past 48 hour(s)).   Blood Alcohol level:  Lab Results  Component Value Date    ETH 163 (H) 01/13/2023   ETH 198 (H) 02/11/2022    Metabolic Disorder Labs: Lab Results  Component Value Date   HGBA1C 5.7 (H) 01/14/2023   MPG 116.89 01/14/2023   MPG 111.15 01/13/2023   Lab Results  Component Value Date   PROLACTIN 31.4 (H) 11/16/2019   Lab Results  Component Value Date   CHOL 126 01/14/2023   TRIG 65 01/14/2023   HDL 56 01/14/2023   CHOLHDL 2.3 01/14/2023   VLDL 13 01/14/2023   LDLCALC 57 01/14/2023   LDLCALC 58 01/13/2023    Physical Findings: AIMS: Facial and Oral Movements Muscles of Facial Expression: None, normal Lips and Perioral Area: None, normal Jaw: None, normal Tongue: None, normal,Extremity Movements Upper (arms, wrists, hands, fingers): None, normal Lower (legs, knees, ankles, toes): None, normal, Trunk Movements Neck, shoulders, hips: None, normal, Overall Severity Severity of abnormal movements (highest score from questions above): None, normal Incapacitation due to abnormal movements: None, normal Patient's awareness of abnormal movements (rate only patient's report): No Awareness, Dental Status Current problems with teeth and/or dentures?: No Does patient usually wear dentures?: No  CIWA:  CIWA-Ar Total: 2 COWS:     Musculoskeletal: Strength & Muscle Tone: within normal limits Gait & Station: normal Patient leans: N/A  Psychiatric Specialty Exam:  Presentation  General Appearance:  Fairly Groomed  Eye Contact: Fair  Speech: Clear and Coherent  Speech Volume: Normal  Handedness: Right   Mood and Affect  Mood: Anxious  Affect: Congruent   Thought Process  Thought Processes: Coherent  Descriptions of Associations:Intact  Orientation:Partial  Thought Content:Illogical  History of Schizophrenia/Schizoaffective disorder:Yes  Duration of Psychotic Symptoms:Greater than six months  Hallucinations:Hallucinations: Auditory; Visual   Ideas of Reference:Paranoia; Delusions  Suicidal  Thoughts:Suicidal Thoughts: No   Homicidal Thoughts:Homicidal Thoughts: Yes, Passive HI Passive Intent and/or Plan: Without Intent; Without Plan (contracts for safety)    Sensorium  Memory: Immediate Good  Judgment: Poor  Insight: Poor  Executive Functions  Concentration: Poor  Attention Span: Fair  Recall: Poor  Fund of Knowledge: Poor  Language: Fair   Lexicographer Activity: Psychomotor Activity: Normal    Assets  Assets: Resilience   Sleep  Sleep: Sleep: Poor     Physical Exam: Physical Exam Constitutional:      Appearance: Normal appearance.  HENT:     Head: Normocephalic.     Nose: Nose normal. No congestion.  Eyes:     Pupils: Pupils are equal, round, and reactive to light.  Pulmonary:     Effort: Pulmonary effort is normal.  Musculoskeletal:        General: Normal range of motion.     Cervical back: Normal range of motion.  Neurological:     Mental Status: He is alert and oriented to person, place, and time.    Review of Systems  Constitutional:  Negative for fever.  HENT:  Negative for hearing loss.   Eyes:  Negative for blurred vision.  Respiratory:  Negative for cough.   Cardiovascular:  Negative for chest pain.  Gastrointestinal:  Negative for heartburn.  Genitourinary:  Negative for dysuria.  Musculoskeletal:  Negative for myalgias.  Skin:  Negative for rash.  Neurological:  Negative for dizziness.  Psychiatric/Behavioral:  Positive for depression, hallucinations and substance abuse. Negative for memory loss and suicidal ideas. The patient is nervous/anxious and has insomnia.    Blood pressure (!) 137/90, pulse (!) 102, temperature 98.5 F (36.9 C), temperature source Oral, resp. rate 16, height  (1.626 m), weight 56.5 kg, SpO2 99 %. Body mass index is 21.39 kg/m.  Treatment Plan Summary: Daily contact with patient to assess and evaluate symptoms and progress in treatment and Medication  management   Observation Level/Precautions:  15 minute checks  Laboratory:  Labs reviewed: Potassium level on admission was 3.2, ordered 40 mEq x 2 doses, and repeated level and it is now WNL.  LFTs are also elevated, repeated to see and trending downwards.  This is most likely related to chronic alcoholism.  Psychotherapy:  Unit Group sessions  Medications:  See [facility]  Consultations:  To be determined   Discharge Concerns:  Safety, medication compliance, mood stability  Estimated LOS: 5-7 days  Other:  N/A    PLAN Safety and Monitoring: Voluntary admission to inpatient psychiatric unit for safety, stabilization and treatment Daily contact with patient to assess and evaluate symptoms and progress in treatment Patient's case to be discussed in multi-disciplinary team meeting Observation Level : q15 minute checks Vital signs: q12 hours Precautions: Safety    Long Term Goal(s): Improvement in symptoms so as ready for discharge   Short Term Goals: Ability to identify changes in lifestyle to reduce recurrence of condition will improve, Ability to verbalize feelings will improve, Ability to disclose and discuss suicidal ideas, Ability to demonstrate self-control will improve, Ability to identify and develop effective coping behaviors will improve, Ability to maintain clinical measurements within normal limits will improve, Compliance with prescribed medications will improve, and Ability to identify triggers associated with substance abuse/mental health issues will improve   Diagnoses:  Principal Problem:   Schizoaffective disorder Active Problems:   Cocaine abuse with cocaine-induced disorder   Alcohol use disorder, severe, dependence   Anxiety state   Insomnia   Medications -Increase Haldol to 10 mg BID for psychosis & switch to Haldol solution due to concerns for med cheeking   -Continue Cogentin 0.5 mg BID for EPS prophylaxis -Continue Zyprexa to 10  mg BID for psychosis/mood  stabilization  -Continue Vitamin D3 daily for vit D deficiency -Continue Ativan detox protocol as per Mar with [facility] every 6 hours-See the mar for complete order -Increase trazodone 150 mg nightly for sleep -Continue agitation protocol: Ativan/Haldol/Benadryl every 6 hours as needed-See AR for complete order    PRNS -Continue Hydroxyzine 25 mg every 6 hours PRN -Continue Tylenol 650 mg every 6 hours PRN for mild pain -Continue Maalox 30 mg every 4 hrs PRN for indigestion -Continue Imodium 2-4 mg as needed for diarrhea -Continue Milk of Magnesia as needed every 6 hrs for constipation -Continue Zofran disintegrating tabs every 6 hrs PRN for nausea   Discharge Planning: Social work and case management to assist with discharge planning and identification of hospital follow-up needs prior to discharge Estimated LOS: 5-7 days Discharge Concerns: Need to establish a safety plan; Medication compliance and effectiveness Discharge Goals: Return home with outpatient referrals for mental health follow-up including medication management/psychotherapy   Starleen Blue, NP 01/18/2023, 5:46 PMPatient ID: [name], male   DOB: 31-Mar-1991, 32 y.o.   MRN: [id number] Patient ID: Santonio Speakman, male   DOB: 1991/03/22, 32 y.o.   MRN: [id number] Patient ID: Ettore Trebilcock, male   DOB: 09-02-1991, 32 y.o.   MRN: [id number]

## 2023-01-18 NOTE — Plan of Care (Signed)
  Problem: Education: Goal: Knowledge of General Education information will improve Description: Including pain rating scale, medication(s)/side effects and non-pharmacologic comfort measures Outcome: Progressing   Problem: Coping: Goal: Level of anxiety will decrease Outcome: Progressing   Problem: Safety: Goal: Ability to remain free from injury will improve Outcome: Progressing   Problem: Education: Goal: Knowledge of the prescribed therapeutic regimen will improve Outcome: Progressing   Problem: Coping: Goal: Will verbalize feelings Outcome: Progressing   Problem: Role Relationship: Goal: Ability to interact with others will improve Outcome: Progressing

## 2023-01-18 NOTE — Progress Notes (Signed)
Pt observed in hall at medication window on initial interactions. Denies SI, HI, VH and pain when assessed "not right now".  However, he continues to endorse +AH "I still hear the voices but nothing I can't handle. I think I need my medicines to be tweak little bit. Klonopin helps me with the voices". Reports poor sleep last night with with good appetite. Guarded, isolative to his room at intervals this shift. Required multiple prompts to attend scheduled groups. Pt received agitation protocol earlier this shift at 1105 for stating "The voices are telling me to hurt him" pointing to the maintenance man in his room while replacing his [facility] unit. Noted with increased restlessness and irritability at the time. Calmer, in room when reassessed at 1205. Educated about  his request for Klonopin with his underlying polysubstance abuse. Support, encouragement and reassurance offered. Safety checks maintained at Q 15 minutes intervals without incident. Pt tolerated meals, medications and fluids well.

## 2023-01-18 NOTE — [facility] Group Notes (Signed)
Pt was encouraged but refused to attend group discussion  

## 2023-01-19 DIAGNOSIS — F259 Schizoaffective disorder, unspecified: Secondary | ICD-10-CM | POA: Diagnosis not present

## 2023-01-19 DIAGNOSIS — F250 Schizoaffective disorder, bipolar type: Secondary | ICD-10-CM | POA: Diagnosis not present

## 2023-01-19 NOTE — Group Note (Signed)
Occupational Therapy Group Note  Group Topic: Sleep Hygiene  Group Date: 01/19/2023 Start Time: 1405 End Time: 1435 Facilitators: Ted Mcalpine, OT   Group Description: Group encouraged increased participation and engagement through topic focused on sleep hygiene. Patients reflected on the quality of sleep they typically receive and identified areas that need improvement. Group was given background information on sleep and sleep hygiene, including common sleep disorders. Group members also received information on how to improve one's sleep and introduced a sleep diary as a tool that can be utilized to track sleep quality over a length of time. Group session ended with patients identifying one or more strategies they could utilize or implement into their sleep routine in order to improve overall sleep quality.        Therapeutic Goal(s):  Identify one or more strategies to improve overall sleep hygiene  Identify one or more areas of sleep that are negatively impacted (sleep too much, too little, etc)     Participation Level: Engaged   Participation Quality: Independent   Behavior: Appropriate   Speech/Thought Process: Loose association    Affect/Mood: Flat   Insight: Limited   Judgement: Limited   Individualization: pt was engaged in their participation of group discussion/activity. New skills were identified  Modes of Intervention: Education  Patient Response to Interventions:  Attentive   Plan: Continue to engage patient in OT groups 2 - 3x/week.  01/19/2023  Ted Mcalpine, OT Kerrin Champagne, OT

## 2023-01-19 NOTE — Progress Notes (Signed)
   01/19/23 1610  15 Minute Checks  Location Dayroom  Visual Appearance Calm  Behavior Composed  Sleep (Behavioral Health Patients Only)  Calculate sleep? (Click Yes once per 24 hr at 0600 safety check) Yes  Documented sleep last 24 hours 14.25

## 2023-01-19 NOTE — Progress Notes (Signed)
Pt denies withdrawal symptoms today. Pt reports anxiety is tolerable this afternoon. Pt endorses auditory hallucinations. Pt reports voices are derogatory "and annoying". Pt denies SI/HI/self harm thoughts. Pt attended some groups. Q 15 minute checks ongoing.

## 2023-01-19 NOTE — Progress Notes (Signed)
[facility] MD Progress Note  01/19/2023 4:39 PM Mark Hammond  MRN:  [id number] Principal Problem: Schizoaffective disorder Diagnosis: Principal Problem:   Schizoaffective disorder Active Problems:   Cocaine abuse with cocaine-induced disorder   Alcohol use disorder, severe, dependence   Anxiety state   Insomnia  Reason for Admission: Mark Hammond is a 32 yo male with prior mental health diagnoses of paranoid schizophrenia, cocaine use d/o & alcohol use d/o who was taken to the [facility] ER on 4/12 after bystanders called 911 to report that pt was exhibiting concerning and, bizarre behaviors such as drinking water from the potholes on the street. While at the ER,pt reported +SI, denied having a plan, endorsed +AH & alcohol and cocaine use. He was observed to have self inflicted cuts to his abdomen and b/l arms. Pt was transferred and admitted to this [facility] voluntarily for treatment and stabilization of his mental status.   24 hr chart review: Sleep Hours last night: 14.25 hrs Nursing Concerns:  irritability Behavioral episodes in the past 24 hrs: Seeking Klonopin from nursing staff Medication Compliance: compliant  Vital Signs in the past 24 hrs: WNL PRN Medications in the past 24 hrs: Haldol, Benadryl, Ativan.  Patient assessment note: During assessment, pt denied SI, denies HI, denied AVH, and denied paranoia and denied 1st rank symptoms. He reported that the increase in the dose of his Trazodone to 150 mg was helpful and that he was able to sleep well last night. He reported a good appetite, denied medication related side effects and denied being in any physical pain.  Patient later approached his assigned RN to state that he was hearing voices, and did not seem to be responding to any internal/external stimuli.  He seems to be seeking medications that make him feel euphoric. He asked his nurse yesterday specifically for Klonopin and stated that he likes the way it makes him feel. Patient is  currently on 2 antipsychotics since one was not sufficient in the management of his psychosis. Psychosis was persistent with one antipsychotic.  We are continuing medications as listed below (Zyprexa 10 mg BID and Haldol 10 mg BID), and others as listed below. We will consider discharging pt Monday if psychosis clears, and there are f/u appointments.  Total Time spent with patient: 45 minutes  Past Psychiatric History: See H & P  Past Medical History:  Past Medical History:  Diagnosis Date   Alcoholic gastritis    Bipolar 1 disorder    Dental abscess    Depression    Drug abuse    ETOH abuse    Hallucination    Schizo affective schizophrenia    No past surgical history on file. Family History:  Family History  Problem Relation Age of Onset   Stroke Other    Family Psychiatric  History: See H & P Social History:  Social History   Substance and Sexual Activity  Alcohol Use Yes   Comment: 4x 40oz beers daily     Social History   Substance and Sexual Activity  Drug Use Yes   Frequency: 7.0 times per week   Types: "Crack" cocaine, Heroin, MDMA (Ecstacy), Marijuana, Cocaine   Comment: occasional stimulant use    Social History   Socioeconomic History   Marital status: Single    Spouse name: Not on file   Number of children: Not on file   Years of education: Not on file   Highest education level: Not on file  Occupational History  Not on file  Tobacco Use   Smoking status: Every Day    Packs/day: 1    Types: Cigarettes   Smokeless tobacco: Never  Vaping Use   Vaping Use: Every day   Substances: THC  Substance and Sexual Activity   Alcohol use: Yes    Comment: 4x 40oz beers daily   Drug use: Yes    Frequency: 7.0 times per week    Types: "Crack" cocaine, Heroin, MDMA (Ecstacy), Marijuana, Cocaine    Comment: occasional stimulant use   Sexual activity: Yes  Other Topics Concern   Not on file  Social History Narrative   Pt lives in [place] with  ex-girlfriend.  Pt is not followed by an outpatient psychiatric provider.   Social Determinants of Health   Financial Resource Strain: Not on file  Food Insecurity: Food Insecurity Present (01/13/2023)   Hunger Vital Sign    Worried About Running Out of Food in the Last Year: Often true    Ran Out of Food in the Last Year: Sometimes true  Transportation Needs: Unmet Transportation Needs (01/13/2023)   PRAPARE - Administrator, Civil Service (Medical): Yes    Lack of Transportation (Non-Medical): Yes  Physical Activity: Not on file  Stress: Not on file  Social Connections: Not on file   Sleep: Fair  Appetite:  Good  Current Medications: Current Facility-Administered Medications  Medication Dose Route Frequency Provider Last Rate Last Admin   acetaminophen (TYLENOL) tablet 650 mg  650 mg Oral Q6H PRN Motley-Mangrum, Jadeka A, PMHNP   650 mg at 01/18/23 2104   alum & mag hydroxide-simeth (MAALOX/MYLANTA) 200-200-20 MG/5ML suspension 30 mL  30 mL Oral Q4H PRN Motley-Mangrum, Jadeka A, PMHNP       benztropine (COGENTIN) tablet 0.5 mg  0.5 mg Oral BID Starleen Blue, NP   0.5 mg at 01/19/23 0850   cholecalciferol (VITAMIN D3) 25 MCG (1000 UNIT) tablet 1,000 Units  1,000 Units Oral Daily Starleen Blue, NP   1,000 Units at 01/18/23 2102   diphenhydrAMINE (BENADRYL) capsule 50 mg  50 mg Oral TID PRN Motley-Mangrum, Jadeka A, PMHNP   50 mg at 01/18/23 1105   Or   diphenhydrAMINE (BENADRYL) injection 50 mg  50 mg Intramuscular TID PRN Motley-Mangrum, Jadeka A, PMHNP       feeding supplement (ENSURE ENLIVE / ENSURE PLUS) liquid 237 mL  237 mL Oral BID BM Rex Kras, MD   237 mL at 01/19/23 1404   haloperidol (HALDOL) 2 MG/ML solution 10 mg  10 mg Oral BID Starleen Blue, NP   10 mg at 01/19/23 0846   haloperidol (HALDOL) tablet 5 mg  5 mg Oral TID PRN Motley-Mangrum, Jadeka A, PMHNP   5 mg at 01/18/23 1105   Or   haloperidol lactate (HALDOL) injection 5 mg  5 mg Intramuscular  TID PRN Motley-Mangrum, Geralynn Ochs A, PMHNP       hydrOXYzine (ATARAX) tablet 25 mg  25 mg Oral TID PRN Motley-Mangrum, Jadeka A, PMHNP   25 mg at 01/17/23 1746   magnesium hydroxide (MILK OF MAGNESIA) suspension 30 mL  30 mL Oral Daily PRN Motley-Mangrum, Jadeka A, PMHNP       menthol-cetylpyridinium (CEPACOL) lozenge 3 mg  1 lozenge Oral PRN Sirinity Outland, NP       multivitamin with minerals tablet 1 tablet  1 tablet Oral Daily Motley-Mangrum, Jadeka A, PMHNP   1 tablet at 01/19/23 0848   OLANZapine zydis (ZYPREXA) disintegrating tablet 10 mg  10  mg Oral BID Starleen Blue, NP   10 mg at 01/19/23 0849   thiamine (Vitamin B-1) tablet 100 mg  100 mg Oral Daily Motley-Mangrum, Jadeka A, PMHNP   100 mg at 01/19/23 0848   traZODone (DESYREL) tablet 150 mg  150 mg Oral QHS Starleen Blue, NP   150 mg at 01/18/23 2102    Lab Results:  No results found for this or any previous visit (from the past 48 hour(s)).   Blood Alcohol level:  Lab Results  Component Value Date   ETH 163 (H) 01/13/2023   ETH 198 (H) 02/11/2022    Metabolic Disorder Labs: Lab Results  Component Value Date   HGBA1C 5.7 (H) 01/14/2023   MPG 116.89 01/14/2023   MPG 111.15 01/13/2023   Lab Results  Component Value Date   PROLACTIN 31.4 (H) 11/16/2019   Lab Results  Component Value Date   CHOL 126 01/14/2023   TRIG 65 01/14/2023   HDL 56 01/14/2023   CHOLHDL 2.3 01/14/2023   VLDL 13 01/14/2023   LDLCALC 57 01/14/2023   LDLCALC 58 01/13/2023    Physical Findings: AIMS: Facial and Oral Movements Muscles of Facial Expression: None, normal Lips and Perioral Area: None, normal Jaw: None, normal Tongue: None, normal,Extremity Movements Upper (arms, wrists, hands, fingers): None, normal Lower (legs, knees, ankles, toes): None, normal, Trunk Movements Neck, shoulders, hips: None, normal, Overall Severity Severity of abnormal movements (highest score from questions above): None, normal Incapacitation due to  abnormal movements: None, normal Patient's awareness of abnormal movements (rate only patient's report): No Awareness, Dental Status Current problems with teeth and/or dentures?: No Does patient usually wear dentures?: No  CIWA:  CIWA-Ar Total: 0 COWS:     Musculoskeletal: Strength & Muscle Tone: within normal limits Gait & Station: normal Patient leans: N/A  Psychiatric Specialty Exam:  Presentation  General Appearance:  Appropriate for Environment; Fairly Groomed  Eye Contact: Fair  Speech: Clear and Coherent  Speech Volume: Normal  Handedness: Right   Mood and Affect  Mood: Anxious  Affect: Congruent   Thought Process  Thought Processes: Coherent  Descriptions of Associations:Intact  Orientation:Full (Time, Place and Person)  Thought Content:Logical  History of Schizophrenia/Schizoaffective disorder:Yes  Duration of Psychotic Symptoms:Greater than six months  Hallucinations:Hallucinations: Auditory   Ideas of Reference:None  Suicidal Thoughts:Suicidal Thoughts: No   Homicidal Thoughts:Homicidal Thoughts: No HI Passive Intent and/or Plan: Without Intent; Without Plan Air traffic controller for safety)    Sensorium  Memory: Immediate Good  Judgment: Poor  Insight: Poor   Executive Functions  Concentration: Poor  Attention Span: Poor  Recall: [employer] of Knowledge: Fair  Language: Fair   Psychomotor Activity  Psychomotor Activity: Psychomotor Activity: Normal    Assets  Assets: Resilience   Sleep  Sleep: Sleep: Good     Physical Exam: Physical Exam Constitutional:      Appearance: Normal appearance.  HENT:     Head: Normocephalic.     Nose: Nose normal. No congestion.  Eyes:     Pupils: Pupils are equal, round, and reactive to light.  Pulmonary:     Effort: Pulmonary effort is normal.  Musculoskeletal:        General: Normal range of motion.     Cervical back: Normal range of motion.  Neurological:      Mental Status: He is alert and oriented to person, place, and time.    Review of Systems  Constitutional:  Negative for fever.  HENT:  Negative for hearing loss.  Eyes:  Negative for blurred vision.  Respiratory:  Negative for cough.   Cardiovascular:  Negative for chest pain.  Gastrointestinal:  Negative for heartburn.  Genitourinary:  Negative for dysuria.  Musculoskeletal:  Negative for myalgias.  Skin:  Negative for rash.  Neurological:  Negative for dizziness.  Psychiatric/Behavioral:  Positive for depression, hallucinations and substance abuse. Negative for memory loss and suicidal ideas. The patient is nervous/anxious and has insomnia.    Blood pressure 128/86, pulse (!) 112, temperature 97.9 F (36.6 C), temperature source Oral, resp. rate 16, height  (1.626 m), weight 56.5 kg, SpO2 99 %. Body mass index is 21.39 kg/m.  Treatment Plan Summary: Daily contact with patient to assess and evaluate symptoms and progress in treatment and Medication management   Observation Level/Precautions:  15 minute checks  Laboratory:  Labs reviewed: Potassium level on admission was 3.2, ordered 40 mEq x 2 doses, and repeated level and it is now WNL.  LFTs are also elevated, repeated to see and trending downwards.  This is most likely related to chronic alcoholism.  Psychotherapy:  Unit Group sessions  Medications:  See [facility]  Consultations:  To be determined   Discharge Concerns:  Safety, medication compliance, mood stability  Estimated LOS: 5-7 days  Other:  N/A    PLAN Safety and Monitoring: Voluntary admission to inpatient psychiatric unit for safety, stabilization and treatment Daily contact with patient to assess and evaluate symptoms and progress in treatment Patient's case to be discussed in multi-disciplinary team meeting Observation Level : q15 minute checks Vital signs: q12 hours Precautions: Safety    Long Term Goal(s): Improvement in symptoms so as ready for discharge    Short Term Goals: Ability to identify changes in lifestyle to reduce recurrence of condition will improve, Ability to verbalize feelings will improve, Ability to disclose and discuss suicidal ideas, Ability to demonstrate self-control will improve, Ability to identify and develop effective coping behaviors will improve, Ability to maintain clinical measurements within normal limits will improve, Compliance with prescribed medications will improve, and Ability to identify triggers associated with substance abuse/mental health issues will improve   Diagnoses:  Principal Problem:   Schizoaffective disorder Active Problems:   Cocaine abuse with cocaine-induced disorder   Alcohol use disorder, severe, dependence   Anxiety state   Insomnia   Medications -Continue Haldol to 10 mg BID for psychosis (switched to Haldol solution due to concerns for med cheeking )  -Continue Cogentin 0.5 mg BID for EPS prophylaxis -Continue Zyprexa to 10 mg BID for psychosis/mood stabilization  -Continue Vitamin D3 daily for vit D deficiency -Ativan detox protocol completed -Continue trazodone 150 mg nightly for sleep -Continue agitation protocol: Ativan/Haldol/Benadryl every 6 hours as needed-See AR for complete order    PRNS -Continue Hydroxyzine 25 mg every 6 hours PRN -Continue Tylenol 650 mg every 6 hours PRN for mild pain -Continue Maalox 30 mg every 4 hrs PRN for indigestion -Continue Imodium 2-4 mg as needed for diarrhea -Continue Milk of Magnesia as needed every 6 hrs for constipation -Continue Zofran disintegrating tabs every 6 hrs PRN for nausea   Discharge Planning: Social work and case management to assist with discharge planning and identification of hospital follow-up needs prior to discharge Estimated LOS: 5-7 days Discharge Concerns: Need to establish a safety plan; Medication compliance and effectiveness Discharge Goals: Return home with outpatient referrals for mental health follow-up  including medication management/psychotherapy   Starleen Blue, NP 01/19/2023, 4:39 PMPatient ID: [name], male  DOB: 1991-07-06, 31 y.o.   MRN: [id number] Patient ID: Anita Mcadory, male   DOB: Sep 08, 1991, 32 y.o.   MRN: [id number] Patient ID: Aziah Brostrom, male   DOB: May 21, 1991, 32 y.o.   MRN: [id number] Patient ID: Zakaria Sedor, male   DOB: [birth year], 32 y.o.   MRN: [id number]

## 2023-01-19 NOTE — Progress Notes (Signed)
   01/18/23 2300  Psych Admission Type (Psych Patients Only)  Admission Status Voluntary  Psychosocial Assessment  Patient Complaints Anxiety;Depression  Eye Contact Fair  Facial Expression Animated  Affect Appropriate to circumstance  Speech Logical/coherent  Interaction Assertive  Motor Activity Other (Comment)  Appearance/Hygiene In scrubs  Behavior Characteristics Cooperative  Mood Pleasant  Aggressive Behavior  Effect No apparent injury  Thought Process  Coherency WDL  Content WDL  Delusions None reported or observed  Perception WDL  Hallucination None reported or observed  Judgment Poor  Confusion None  Danger to Self  Current suicidal ideation? Denies  Agreement Not to Harm Self Yes  Description of Agreement Verbal  Danger to Others  Danger to Others None reported or observed

## 2023-01-19 NOTE — Progress Notes (Signed)
   01/19/23 1100  Spiritual Encounters  Type of Visit Initial  Care provided to: Patient  Referral source Chaplain team;Chaplain assessment  Reason for visit Urgent spiritual support  OnCall Visit No  Spiritual Framework  Presenting Themes Meaning/purpose/sources of inspiration;Values and beliefs;Coping tools  Patient Stress Factors Family relationships;Health changes;Loss of control  Family Stress Factors None identified  Interventions  Spiritual Care Interventions Made Reflective listening;Compassionate presence;Normalization of emotions;Established relationship of care and support   Chaplain met with Mark Hammond today for a brief introductory meeting.  His spiritual distress in none at this time. Spoke of his family support and the origin of his name and the names of his family.  Spoke of spiritual support that he desires at this time. Chaplain provided support.

## 2023-01-19 NOTE — Group Note (Signed)
Date:  01/19/2023 Time:  8:44 PM  Group Topic/Focus:  Wrap-Up Group:   The focus of this group is to help patients review their daily goal of treatment and discuss progress on daily workbooks.    Participation Level:  Did Not Attend  Scot Dock 01/19/2023, 8:44 PM

## 2023-01-19 NOTE — Group Note (Signed)
Recreation Therapy Group Note   Group Topic:Leisure Education  Group Date: 01/19/2023 Start Time: 1000 End Time: 1036 Facilitators: Ely Ballen-McCall, LRT,CTRS Location: 500 [address] Dayroom   Goal Area(s) Addresses:  Patient will successfully identify positive leisure and recreation activities.  Patient will acknowledge benefits of participation in healthy leisure activities post discharge.  Patient will actively work with peers toward a shared goal.   Group Description: Pictionary. In groups of 5-7, patients took turns trying to guess the picture being drawn on the board by their teammate.  If the team guessed the correct answer, they won a point.  If the team guessed wrong, the other team got a chance to steal the point. After several rounds of game play, the team with the most points were declared winners. Post-activity discussion reviewed benefits of positive recreation outlets: reducing stress, improving coping mechanisms, increasing self-esteem, and building larger support systems.   Affect/Mood: Appropriate   Participation Level: Engaged   Participation Quality: Independent   Behavior: Appropriate   Speech/Thought Process: Focused   Insight: Good   Judgement: Good   Modes of Intervention: Competitive Play   Patient Response to Interventions:  Engaged   Education Outcome:  In group clarification offered    Clinical Observations/Individualized Feedback: Pt was bright and engaged during group session.  Pt interacted well with peers.  Pt was focused on guessing the pictures being drawn on the board.  Pt did express he didn't like to lose but remained positive when he didn't guess a picture correctly.  Pt was appropriate throughout group session.    Plan: Continue to engage patient in RT group sessions 2-3x/week.   Jalayiah Bibian-McCall, LRT,CTRS  01/19/2023 12:18 PM

## 2023-01-19 NOTE — Group Note (Signed)
Date:  01/19/2023 Time:  6:49 PM  Group Topic/Focus:  Goals Group:   The focus of this group is to help patients establish daily goals to achieve during treatment and discuss how the patient can incorporate goal setting into their daily lives to aide in recovery. Orientation:   The focus of this group is to educate the patient on the purpose and policies of crisis stabilization and provide a format to answer questions about their admission.  The group details unit policies and expectations of patients while admitted.    Participation Level:  Active  Participation Quality:  Appropriate  Affect:  Appropriate  Cognitive:  Appropriate  Insight: Appropriate  Engagement in Group:  Engaged  Modes of Intervention:  Activity, Orientation, and Rapport Building  Additional Comments:   Pt attended and actively participated in the Orientation/Goals group. Pt's personal goal is to discharge from treatment.  Mark Hammond 01/19/2023, 6:49 PM

## 2023-01-20 ENCOUNTER — Encounter (HOSPITAL_COMMUNITY): Payer: Self-pay

## 2023-01-20 DIAGNOSIS — F258 Other schizoaffective disorders: Secondary | ICD-10-CM | POA: Diagnosis not present

## 2023-01-20 NOTE — BH IP Treatment Plan (Signed)
Interdisciplinary Treatment and Diagnostic Plan Update  01/20/2023 Time of Session: 1200 Mark Hammond MRN: [id number]  Principal Diagnosis: Schizoaffective disorder  Secondary Diagnoses: Principal Problem:   Schizoaffective disorder Active Problems:   Cocaine abuse with cocaine-induced disorder   Alcohol use disorder, severe, dependence   Anxiety state   Insomnia   Current Medications:  Current Facility-Administered Medications  Medication Dose Route Frequency Provider Last Rate Last Admin   acetaminophen (TYLENOL) tablet 650 mg  650 mg Oral Q6H PRN Motley-Mangrum, Jadeka A, PMHNP   650 mg at 01/18/23 2104   alum & mag hydroxide-simeth (MAALOX/MYLANTA) 200-200-20 MG/5ML suspension 30 mL  30 mL Oral Q4H PRN Motley-Mangrum, Jadeka A, PMHNP       benztropine (COGENTIN) tablet 0.5 mg  0.5 mg Oral BID Starleen Blue, NP   0.5 mg at 01/20/23 0834   cholecalciferol (VITAMIN D3) 25 MCG (1000 UNIT) tablet 1,000 Units  1,000 Units Oral Daily Starleen Blue, NP   1,000 Units at 01/19/23 2111   diphenhydrAMINE (BENADRYL) capsule 50 mg  50 mg Oral TID PRN Motley-Mangrum, Jadeka A, PMHNP   50 mg at 01/18/23 1105   Or   diphenhydrAMINE (BENADRYL) injection 50 mg  50 mg Intramuscular TID PRN Motley-Mangrum, Jadeka A, PMHNP       feeding supplement (ENSURE ENLIVE / ENSURE PLUS) liquid 237 mL  237 mL Oral BID BM Goli, Veeraindar, MD   237 mL at 01/20/23 1314   haloperidol (HALDOL) 2 MG/ML solution 10 mg  10 mg Oral BID Starleen Blue, NP   10 mg at 01/20/23 0835   haloperidol (HALDOL) tablet 5 mg  5 mg Oral TID PRN Motley-Mangrum, Jadeka A, PMHNP   5 mg at 01/18/23 1105   Or   haloperidol lactate (HALDOL) injection 5 mg  5 mg Intramuscular TID PRN Motley-Mangrum, Geralynn Ochs A, PMHNP       hydrOXYzine (ATARAX) tablet 25 mg  25 mg Oral TID PRN Motley-Mangrum, Jadeka A, PMHNP   25 mg at 01/17/23 1746   magnesium hydroxide (MILK OF MAGNESIA) suspension 30 mL  30 mL Oral Daily PRN Motley-Mangrum, Jadeka A, PMHNP    30 mL at 01/20/23 1315   menthol-cetylpyridinium (CEPACOL) lozenge 3 mg  1 lozenge Oral PRN Starleen Blue, NP       multivitamin with minerals tablet 1 tablet  1 tablet Oral Daily Motley-Mangrum, Jadeka A, PMHNP   1 tablet at 01/20/23 0834   OLANZapine zydis (ZYPREXA) disintegrating tablet 10 mg  10 mg Oral BID Starleen Blue, NP   10 mg at 01/20/23 0834   thiamine (Vitamin B-1) tablet 100 mg  100 mg Oral Daily Motley-Mangrum, Jadeka A, PMHNP   100 mg at 01/20/23 0834   traZODone (DESYREL) tablet 150 mg  150 mg Oral QHS Starleen Blue, NP   150 mg at 01/19/23 2111   PTA Medications: No medications prior to admission.    Patient Stressors: Financial difficulties   Loss of relationship/significant other   Marital or family conflict   Medication change or noncompliance   Substance abuse    Patient Strengths: Capable of independent living  Forensic psychologist fund of knowledge  Motivation for treatment/growth  Religious Affiliation   Treatment Modalities: Medication Management, Group therapy, Case management,  1 to 1 session with clinician, Psychoeducation, Recreational therapy.   Physician Treatment Plan for Primary Diagnosis: Schizoaffective disorder Long Term Goal(s): Improvement in symptoms so as ready for discharge   Short Term Goals: Ability to identify changes in lifestyle to reduce  recurrence of condition will improve Ability to verbalize feelings will improve Ability to disclose and discuss suicidal ideas Ability to demonstrate self-control will improve Ability to identify and develop effective coping behaviors will improve Ability to maintain clinical measurements within normal limits will improve Compliance with prescribed medications will improve Ability to identify triggers associated with substance abuse/mental health issues will improve  Medication Management: Evaluate patient's response, side effects, and tolerance of medication regimen.  Therapeutic  Interventions: 1 to 1 sessions, Unit Group sessions and Medication administration.  Evaluation of Outcomes: Progressing  Physician Treatment Plan for Secondary Diagnosis: Principal Problem:   Schizoaffective disorder Active Problems:   Cocaine abuse with cocaine-induced disorder   Alcohol use disorder, severe, dependence   Anxiety state   Insomnia  Long Term Goal(s): Improvement in symptoms so as ready for discharge   Short Term Goals: Ability to identify changes in lifestyle to reduce recurrence of condition will improve Ability to verbalize feelings will improve Ability to disclose and discuss suicidal ideas Ability to demonstrate self-control will improve Ability to identify and develop effective coping behaviors will improve Ability to maintain clinical measurements within normal limits will improve Compliance with prescribed medications will improve Ability to identify triggers associated with substance abuse/mental health issues will improve     Medication Management: Evaluate patient's response, side effects, and tolerance of medication regimen.  Therapeutic Interventions: 1 to 1 sessions, Unit Group sessions and Medication administration.  Evaluation of Outcomes: Progressing   RN Treatment Plan for Primary Diagnosis: Schizoaffective disorder Long Term Goal(s): Knowledge of disease and therapeutic regimen to maintain health will improve  Short Term Goals: Ability to remain free from injury will improve, Ability to verbalize frustration and anger appropriately will improve, Ability to demonstrate self-control, Ability to participate in decision making will improve, Ability to verbalize feelings will improve, Ability to disclose and discuss suicidal ideas, Ability to identify and develop effective coping behaviors will improve, and Compliance with prescribed medications will improve  Medication Management: RN will administer medications as ordered by provider, will assess and  evaluate patient's response and provide education to patient for prescribed medication. RN will report any adverse and/or side effects to prescribing provider.  Therapeutic Interventions: 1 on 1 counseling sessions, Psychoeducation, Medication administration, Evaluate responses to treatment, Monitor vital signs and CBGs as ordered, Perform/monitor CIWA, COWS, AIMS and Fall Risk screenings as ordered, Perform wound care treatments as ordered.  Evaluation of Outcomes: Progressing   LCSW Treatment Plan for Primary Diagnosis: Schizoaffective disorder Long Term Goal(s): Safe transition to appropriate next level of care at discharge, Engage patient in therapeutic group addressing interpersonal concerns.  Short Term Goals: Engage patient in aftercare planning with referrals and resources, Increase social support, Increase ability to appropriately verbalize feelings, Increase emotional regulation, Facilitate acceptance of mental health diagnosis and concerns, Facilitate patient progression through stages of change regarding substance use diagnoses and concerns, Identify triggers associated with mental health/substance abuse issues, and Increase skills for wellness and recovery  Therapeutic Interventions: Assess for all discharge needs, 1 to 1 time with Social worker, Explore available resources and support systems, Assess for adequacy in community support network, Educate family and significant other(s) on suicide prevention, Complete Psychosocial Assessment, Interpersonal group therapy.  Evaluation of Outcomes: Progressing   Progress in Treatment: Attending groups: Yes. Participating in groups: Yes. Taking medication as prescribed: Yes. Toleration medication: Yes. Family/Significant other contact made: N Declined Patient understands diagnosis: Yes. Discussing patient identified problems/goals with staff: Yes. Medical problems stabilized or resolved: Yes.  Denies suicidal/homicidal ideation:  Yes. Issues/concerns per patient self-inventory: Yes. Other: N/A  New problem(s) identified: No, Describe:  None Reported  New Short Term/Long Term Goal(s): ): medication stabilization, elimination of SI thoughts, development of comprehensive mental wellness plan.   Patient Goals:  Coping Skills  Discharge Plan or Barriers: Schedule follow up appointments  Reason for Continuation of Hospitalization: Delusions  Medication stabilization Suicidal ideation Withdrawal symptoms  Estimated Length of Stay: 3-7 Days  Last 3 [place] Suicide Severity Risk Score: Flowsheet Row [facility] (Current[facility] Most recent reading at 01/13/2023  5:50 PM ED from 01/13/2023 in [facility] at [facility] Most recent reading at 01/13/2023  3:47 AM ED from 02/11/2022 in [facility] Department at [facility] Most recent reading at 02/11/2022  1:11 AM  C-SSRS RISK CATEGORY Moderate Risk High Risk Low Risk       Last PHQ 2/9 Scores: detox, medication management for mood stabilization; elimination of SI thoughts; development of comprehensive mental wellness/sobriety plan  Scribe for Treatment Team: Ane Payment, LCSW 01/20/2023 4:08 PM

## 2023-01-20 NOTE — Progress Notes (Signed)
[facility] MD Progress Note  01/20/2023 1:04 PM Mark Hammond  MRN:  [id number] Principal Problem: Schizoaffective disorder Diagnosis: Principal Problem:   Schizoaffective disorder Active Problems:   Cocaine abuse with cocaine-induced disorder   Alcohol use disorder, severe, dependence   Anxiety state   Insomnia  Reason for Admission: Mark Hammond is a 32 yo male with past psychiatric history of paranoid schizophrenia, cocaine use disorder, alcohol use disorder, anxiety, methamphetamine use disorder severe, substance induced mood disorder, polysubstance dependence daily use who initially presented to [facility] ED on 4/12 after bystanders called 911 to report that pt was exhibiting concerning and, bizarre behaviors such as drinking water from the potholes on the street. While at the ER, pt reported +SI, denied having a plan, endorsed +AH & alcohol and cocaine use. He was observed to have self inflicted cuts to his abdomen and b/l arms. He was transferred to [facility] 01/13/23 voluntarily for further treatment and stabilization of his mental status.   24 hr chart review: Staff report patient has been intermittently visible in milieu spending a lot of time in his room. Minimal attendance in unit groups and activities. No observations noted of patient responding to any external/internal stimuli. Staff note patient has been medication compliant expressed concern for possible medication seeking behaviors. PRNs include Acetaminophen 650 mg for mild pain. Slept overnight 9.25 hours.   Patient assessment note: Patient observed laying in bed where he was easily aroused. He presented alert and oriented. Calm and cooperative. Euthymic mood; blunt, congruent affect. He reports feeling 'better' and continues to report poor sleep stating he 'tossed and turned' all night. Staff report patient slept 9.25 hours. He denies any 'hallucinations' but reports hearing 'mumbling' that he describes as chronic, denies any commands from  the mumbling. He denies any SI/HI/AVH. Contracts for safety. Endorses 'good' appetite, denies medication related side effects.  Case was discussed with attending MD Abbott Pao; treatment plan noted below.   Total Time spent with patient: 45 minutes  Past Psychiatric History: See H & P  Past Medical History:  Past Medical History:  Diagnosis Date   Alcoholic gastritis    Bipolar 1 disorder    Dental abscess    Depression    Drug abuse    ETOH abuse    Hallucination    Schizo affective schizophrenia    No past surgical history on file. Family History:  Family History  Problem Relation Age of Onset   Stroke Other    Family Psychiatric  History: See H & P Social History:  Social History   Substance and Sexual Activity  Alcohol Use Yes   Comment: 4x 40oz beers daily     Social History   Substance and Sexual Activity  Drug Use Yes   Frequency: 7.0 times per week   Types: "Crack" cocaine, Heroin, MDMA (Ecstacy), Marijuana, Cocaine   Comment: occasional stimulant use    Social History   Socioeconomic History   Marital status: Single    Spouse name: Not on file   Number of children: Not on file   Years of education: Not on file   Highest education level: Not on file  Occupational History   Not on file  Tobacco Use   Smoking status: Every Day    Packs/day: 1    Types: Cigarettes   Smokeless tobacco: Never  Vaping Use   Vaping Use: Every day   Substances: THC  Substance and Sexual Activity   Alcohol use: Yes    Comment: 4x  40oz beers daily   Drug use: Yes    Frequency: 7.0 times per week    Types: "Crack" cocaine, Heroin, MDMA (Ecstacy), Marijuana, Cocaine    Comment: occasional stimulant use   Sexual activity: Yes  Other Topics Concern   Not on file  Social History Narrative   Pt lives in [place] with ex-girlfriend.  Pt is not followed by an outpatient psychiatric provider.   Social Determinants of Health   Financial Resource Strain: Not on file  Food  Insecurity: Food Insecurity Present (01/13/2023)   Hunger Vital Sign    Worried About Running Out of Food in the Last Year: Often true    Ran Out of Food in the Last Year: Sometimes true  Transportation Needs: Unmet Transportation Needs (01/13/2023)   PRAPARE - Administrator, Civil Service (Medical): Yes    Lack of Transportation (Non-Medical): Yes  Physical Activity: Not on file  Stress: Not on file  Social Connections: Not on file   Sleep: Fair  Appetite:  Good  Current Medications: Current Facility-Administered Medications  Medication Dose Route Frequency Provider Last Rate Last Admin   acetaminophen (TYLENOL) tablet 650 mg  650 mg Oral Q6H PRN Motley-Mangrum, Jadeka A, PMHNP   650 mg at 01/18/23 2104   alum & mag hydroxide-simeth (MAALOX/MYLANTA) 200-200-20 MG/5ML suspension 30 mL  30 mL Oral Q4H PRN Motley-Mangrum, Jadeka A, PMHNP       benztropine (COGENTIN) tablet 0.5 mg  0.5 mg Oral BID Starleen Blue, NP   0.5 mg at 01/20/23 0834   cholecalciferol (VITAMIN D3) 25 MCG (1000 UNIT) tablet 1,000 Units  1,000 Units Oral Daily Starleen Blue, NP   1,000 Units at 01/19/23 2111   diphenhydrAMINE (BENADRYL) capsule 50 mg  50 mg Oral TID PRN Motley-Mangrum, Jadeka A, PMHNP   50 mg at 01/18/23 1105   Or   diphenhydrAMINE (BENADRYL) injection 50 mg  50 mg Intramuscular TID PRN Motley-Mangrum, Jadeka A, PMHNP       feeding supplement (ENSURE ENLIVE / ENSURE PLUS) liquid 237 mL  237 mL Oral BID BM Rex Kras, MD   237 mL at 01/20/23 1000   haloperidol (HALDOL) 2 MG/ML solution 10 mg  10 mg Oral BID Starleen Blue, NP   10 mg at 01/20/23 0981   haloperidol (HALDOL) tablet 5 mg  5 mg Oral TID PRN Motley-Mangrum, Jadeka A, PMHNP   5 mg at 01/18/23 1105   Or   haloperidol lactate (HALDOL) injection 5 mg  5 mg Intramuscular TID PRN Motley-Mangrum, Geralynn Ochs A, PMHNP       hydrOXYzine (ATARAX) tablet 25 mg  25 mg Oral TID PRN Motley-Mangrum, Jadeka A, PMHNP   25 mg at 01/17/23 1746    magnesium hydroxide (MILK OF MAGNESIA) suspension 30 mL  30 mL Oral Daily PRN Motley-Mangrum, Jadeka A, PMHNP       menthol-cetylpyridinium (CEPACOL) lozenge 3 mg  1 lozenge Oral PRN Nkwenti, Doris, NP       multivitamin with minerals tablet 1 tablet  1 tablet Oral Daily Motley-Mangrum, Jadeka A, PMHNP   1 tablet at 01/20/23 0834   OLANZapine zydis (ZYPREXA) disintegrating tablet 10 mg  10 mg Oral BID Starleen Blue, NP   10 mg at 01/20/23 0834   thiamine (Vitamin B-1) tablet 100 mg  100 mg Oral Daily Motley-Mangrum, Jadeka A, PMHNP   100 mg at 01/20/23 0834   traZODone (DESYREL) tablet 150 mg  150 mg Oral QHS Starleen Blue, NP   150  mg at 01/19/23 2111    Lab Results:  No results found for this or any previous visit (from the past 48 hour(s)).   Blood Alcohol level:  Lab Results  Component Value Date   ETH 163 (H) 01/13/2023   ETH 198 (H) 02/11/2022    Metabolic Disorder Labs: Lab Results  Component Value Date   HGBA1C 5.7 (H) 01/14/2023   MPG 116.89 01/14/2023   MPG 111.15 01/13/2023   Lab Results  Component Value Date   PROLACTIN 31.4 (H) 11/16/2019   Lab Results  Component Value Date   CHOL 126 01/14/2023   TRIG 65 01/14/2023   HDL 56 01/14/2023   CHOLHDL 2.3 01/14/2023   VLDL 13 01/14/2023   LDLCALC 57 01/14/2023   LDLCALC 58 01/13/2023    Physical Findings: AIMS: Facial and Oral Movements Muscles of Facial Expression: None, normal Lips and Perioral Area: None, normal Jaw: None, normal Tongue: None, normal,Extremity Movements Upper (arms, wrists, hands, fingers): None, normal Lower (legs, knees, ankles, toes): None, normal, Trunk Movements Neck, shoulders, hips: None, normal, Overall Severity Severity of abnormal movements (highest score from questions above): None, normal Incapacitation due to abnormal movements: None, normal Patient's awareness of abnormal movements (rate only patient's report): No Awareness, Dental Status Current problems with teeth  and/or dentures?: No Does patient usually wear dentures?: No  CIWA:  CIWA-Ar Total: 0 COWS:     Musculoskeletal: Strength & Muscle Tone: within normal limits Gait & Station: normal Patient leans: N/A  Psychiatric Specialty Exam:  Presentation  General Appearance:  Appropriate for Environment; Casual  Eye Contact: Fair  Speech: Clear and Coherent  Speech Volume: Normal  Handedness: Right   Mood and Affect  Mood: Euthymic  Affect: Congruent   Thought Process  Thought Processes: Coherent  Descriptions of Associations:Intact  Orientation:Full (Time, Place and Person)  Thought Content:Logical  History of Schizophrenia/Schizoaffective disorder:Yes  Duration of Psychotic Symptoms:Greater than six months  Hallucinations:Hallucinations: Other (comment) Description of Auditory Hallucinations: 'mumbling'   Ideas of Reference:None  Suicidal Thoughts:Suicidal Thoughts: No   Homicidal Thoughts:Homicidal Thoughts: No    Sensorium  Memory: Immediate Good; Recent Good  Judgment: Fair  Insight: Shallow   Executive Functions  Concentration: Poor  Attention Span: Fair  Recall: [employer] of Knowledge: Fair  Language: Fair   Psychomotor Activity  Psychomotor Activity: Psychomotor Activity: Normal    Assets  Assets: Resilience; Physical Health   Sleep  Sleep: Sleep: Good     Physical Exam: Physical Exam Constitutional:      Appearance: Normal appearance.  HENT:     Head: Normocephalic.     Nose: Nose normal. No congestion.  Eyes:     Pupils: Pupils are equal, round, and reactive to light.  Pulmonary:     Effort: Pulmonary effort is normal.  Musculoskeletal:        General: Normal range of motion.     Cervical back: Normal range of motion.  Neurological:     Mental Status: He is alert and oriented to person, place, and time.  Psychiatric:        Attention and Perception: Attention normal. He perceives auditory  hallucinations. He does not perceive visual hallucinations.        Mood and Affect: Affect is blunt.        Speech: Speech normal.        Behavior: Behavior is withdrawn. Behavior is cooperative.        Thought Content: Thought content normal. Thought content is not  paranoid or delusional. Thought content does not include homicidal or suicidal ideation. Thought content does not include homicidal or suicidal plan.        Cognition and Memory: Cognition and memory normal.        Judgment: Judgment normal.    Review of Systems  Constitutional:  Negative for fever.  HENT:  Negative for hearing loss.   Eyes:  Negative for blurred vision.  Respiratory:  Negative for cough.   Cardiovascular:  Negative for chest pain.  Gastrointestinal:  Negative for heartburn.  Genitourinary:  Negative for dysuria.  Musculoskeletal:  Negative for myalgias.  Skin:  Negative for rash.  Neurological:  Negative for dizziness.  Psychiatric/Behavioral:  Positive for hallucinations and substance abuse. Negative for memory loss and suicidal ideas.    Blood pressure 125/84, pulse (!) 114, temperature 98.5 F (36.9 C), temperature source Oral, resp. rate 14, height  (1.626 m), weight 56.5 kg, SpO2 99 %. Body mass index is 21.39 kg/m.  Treatment Plan Summary: Daily contact with patient to assess and evaluate symptoms and progress in treatment and Medication management   Observation Level/Precautions:  15 minute checks  Laboratory:  Labs reviewed: Potassium level on admission was 3.2, ordered 40 mEq x 2 doses, and repeated level and it is now WNL.  LFTs are also elevated, repeated to see and trending downwards.  This is most likely related to chronic alcoholism.  Psychotherapy:  Unit Group sessions  Medications:  See [facility]  Consultations:  To be determined   Discharge Concerns:  Safety, medication compliance, mood stability  Estimated LOS: 5-7 days  Other:  N/A    PLAN Safety and Monitoring: Voluntary  admission to inpatient psychiatric unit for safety, stabilization and treatment Daily contact with patient to assess and evaluate symptoms and progress in treatment Patient's case to be discussed in multi-disciplinary team meeting Observation Level : q15 minute checks Vital signs: q12 hours Precautions: Safety    Long Term Goal(s): Improvement in symptoms so as ready for discharge   Short Term Goals: Ability to identify changes in lifestyle to reduce recurrence of condition will improve, Ability to verbalize feelings will improve, Ability to disclose and discuss suicidal ideas, Ability to demonstrate self-control will improve, Ability to identify and develop effective coping behaviors will improve, Ability to maintain clinical measurements within normal limits will improve, Compliance with prescribed medications will improve, and Ability to identify triggers associated with substance abuse/mental health issues will improve   Diagnoses:  Principal Problem:   Schizoaffective disorder Active Problems:   Cocaine abuse with cocaine-induced disorder   Alcohol use disorder, severe, dependence   Anxiety state   Insomnia   Medications -Continue:  - Haldol to 10 mg BID for psychosis (switched to Haldol solution due to concerns for med cheeking )  - Cogentin 0.5 mg BID for EPS prophylaxis - Zyprexa to 10 mg BID for psychosis/mood stabilization  - Vitamin D3 daily for vit D deficiency  -Ativan Detox Protocol:  completed - Trazodone 150 mg nightly for sleep  -Agitation protocol: Ativan 2 mg IM/PO, Haldol 5 mg IM/PO, Benadryl 50 IM/PO every 6 hours as needed-See MAR for complete order    PRNs: Continue:  - Hydroxyzine 25 mg every 6 hours PRN -Tylenol 650 mg every 6 hours PRN for mild pain - Maalox 30 mg every 4 hrs PRN for indigestion - Imodium 2-4 mg as needed for diarrhea - Milk of Magnesia as needed every 6 hrs for constipation - Zofran disintegrating tabs every 6  hrs PRN for nausea    Discharge Planning: Social work and case management to assist with discharge planning and identification of hospital follow-up needs prior to discharge Estimated LOS: 5-7 days Discharge Concerns: Need to establish a safety plan; Medication compliance and effectiveness Discharge Goals: Return home with outpatient referrals for mental health follow-up including medication management/psychotherapy   Loletta Parish, NP 01/20/2023, 1:04 PM  Patient ID: [name], male   DOB: 08-May-1991, 32 y.o.   MRN: [id number]

## 2023-01-20 NOTE — Progress Notes (Addendum)
Pt reported he slept well last night with good appetite. Denies SI, HI, AVH and pain when assessed. Noted to be defensive, demanding, verbally abusive, cursing at staff when informed of room lock out order post medications administration "This is fucking bullshit. Are you going to lock their rooms too, not everybody come in here for groups or take their meds. Fuck y'all doctor's order. I will sit here because I don't have no fucking choice now". Apologetic to writer this afternoon for his outburst at the beginning of shift. MOM given for complain of constipation without result thus far. Support, encouragement and reassurance offered. Safety checks maintained at Q 15 minutes intervals. Tolerated meals, fluids and medications well. Off unit to courtyard and cafeteria for scheduled meals; returned without issues.

## 2023-01-20 NOTE — Progress Notes (Signed)
   01/20/23 0630  15 Minute Checks  Location Bedroom  Visual Appearance Calm  Behavior Composed  Sleep (Behavioral Health Patients Only)  Calculate sleep? (Click Yes once per 24 hr at 0600 safety check) Yes  Documented sleep last 24 hours 9

## 2023-01-20 NOTE — Progress Notes (Signed)
   01/20/23 2100  Psych Admission Type (Psych Patients Only)  Admission Status Voluntary  Psychosocial Assessment  Patient Complaints None  Eye Contact Fair  Facial Expression Anxious  Affect Blunted  Speech Logical/coherent  Interaction Guarded;Minimal  Motor Activity Fidgety  Appearance/Hygiene In scrubs  Behavior Characteristics Appropriate to situation  Mood Labile  Aggressive Behavior  Effect No apparent injury  Thought Process  Coherency WDL  Content Blaming others  Delusions None reported or observed  Perception Hallucinations  Hallucination Visual;Auditory  Judgment Limited  Confusion None  Danger to Self  Current suicidal ideation? Denies  Agreement Not to Harm Self Yes  Description of Agreement Verbal contract  Danger to Others  Danger to Others None reported or observed

## 2023-01-20 NOTE — Group Note (Signed)
Date:  01/20/2023 Time:  2:02 PM  Group Topic/Focus:  Dimensions of Wellness:   The focus of this group is to introduce the topic of wellness and discuss the role each dimension of wellness plays in total health.    Participation Level:  Active  Participation Quality:  Appropriate  Affect:  Appropriate  Cognitive:  Appropriate  Insight: Appropriate  Engagement in Group:  Engaged  Modes of Intervention:  Clarification and Discussion  Additional Comments: Patient attended group and participated.   Marrian Bells W Alzina Golda 01/20/2023, 2:02 PM

## 2023-01-20 NOTE — Group Note (Signed)
Date:  01/20/2023 Time:  11:57 AM  Group Topic/Focus:  Goals Group:   The focus of this group is to help patients establish daily goals to achieve during treatment and discuss how the patient can incorporate goal setting into their daily lives to aide in recovery. Orientation:   The focus of this group is to educate the patient on the purpose and policies of crisis stabilization and provide a format to answer questions about their admission.  The group details unit policies and expectations of patients while admitted.    Participation Level:  Did Not Attend   Mark Hammond 01/20/2023, 11:57 AM

## 2023-01-20 NOTE — Group Note (Signed)
Recreation Therapy Group Note   Group Topic:Problem Solving  Group Date: 01/20/2023 Start Time: 1005 End Time: 1026 Facilitators: Jannine Abreu-McCall, LRT,CTRS Location: [address] Dayroom   Goal Area(s) Addresses:  Patient will effectively work with peer towards shared goal.  Patient will identify skills used to make activity successful.  Patient will identify how skills used during activity can be applied to reach post d/c goals.    Group Description: [employer]. In teams of 5-6, patients were given 11 craft pipe cleaners. Using the materials provided, patients were instructed to compete again the opposing team(s) to build the tallest free-standing structure from floor level. The activity was timed; difficulty increased by Clinical research associate as Production designer, theatre/television/film continued.  Systematically resources were removed with additional directions for example, placing one arm behind their back, working in silence, and shape stipulations. LRT facilitated post-activity discussion reviewing team processes and necessary communication skills involved in completion. Patients were encouraged to reflect how the skills utilized, or not utilized, in this activity can be incorporated to positively impact support systems post discharge.   Affect/Mood: N/A   Participation Level: Did not attend    Clinical Observations/Individualized Feedback:      Plan: Continue to engage patient in RT group sessions 2-3x/week.   Tori Dattilio-McCall, LRT,CTRS 01/20/2023 10:51 AM

## 2023-01-20 NOTE — Progress Notes (Signed)
   01/20/23 0000  Psych Admission Type (Psych Patients Only)  Admission Status Voluntary  Psychosocial Assessment  Patient Complaints None  Eye Contact Fair  Facial Expression Animated  Affect Appropriate to circumstance  Speech Logical/coherent  Interaction Assertive  Motor Activity Other (Comment)  Appearance/Hygiene In scrubs  Behavior Characteristics Cooperative;Calm  Mood Pleasant  Aggressive Behavior  Effect No apparent injury  Thought Process  Coherency WDL  Content WDL  Perception Hallucinations  Hallucination Auditory  Judgment Limited  Confusion None  Danger to Self  Current suicidal ideation? Denies  Agreement Not to Harm Self Yes  Description of Agreement Verbal  Danger to Others  Danger to Others None reported or observed

## 2023-01-20 NOTE — Plan of Care (Signed)
  Problem: Education: Goal: Knowledge of General Education information will improve Description: Including pain rating scale, medication(s)/side effects and non-pharmacologic comfort measures Outcome: Progressing   Problem: Health Behavior/Discharge Planning: Goal: Ability to manage health-related needs will improve Outcome: Progressing   Problem: Safety: Goal: Ability to remain free from injury will improve Outcome: Progressing   Problem: Education: Goal: Knowledge of the prescribed therapeutic regimen will improve Outcome: Progressing   Problem: Safety: Goal: Ability to redirect hostility and anger into socially appropriate behaviors will improve Outcome: Progressing

## 2023-01-20 NOTE — Plan of Care (Signed)
  Problem: Education: Goal: Knowledge of General Education information will improve Description: Including pain rating scale, medication(s)/side effects and non-pharmacologic comfort measures Outcome: Progressing   Problem: Coping: Goal: Level of anxiety will decrease Outcome: Progressing   Problem: Education: Goal: Will be free of psychotic symptoms Outcome: Progressing Goal: Knowledge of the prescribed therapeutic regimen will improve Outcome: Progressing   Problem: Coping: Goal: Coping ability will improve Outcome: Progressing

## 2023-01-20 NOTE — [facility] Group Notes (Signed)
Spiritual care group facilitated by Chaplain Katy Olanda Downie, B[facility]  Group focused on topic of strength. Group members reflected on what thoughts and feelings emerge when they hear this topic. They then engaged in facilitated dialog around how strength is present in their lives. This dialog focused on representing what strength had been to them in their lives (images and patterns given) and what they saw as helpful in their life now (what they needed / wanted).  Activity drew on narrative framework.  Patient Progress: Did not attend.  

## 2023-01-20 NOTE — Group Note (Signed)
Date:  01/20/2023 Time:  9:08 PM  Group Topic/Focus:  Wrap-Up Group:   The focus of this group is to help patients review their daily goal of treatment and discuss progress on daily workbooks.    Participation Level:  Active  Participation Quality:  Appropriate  Affect:  Appropriate  Cognitive:  Appropriate  Insight: Appropriate  Engagement in Group:  Engaged  Modes of Intervention:  Education and Exploration  Additional Comments:  Patient attended and participated in group tonight. He reports that the best thing that happen for him today that he eat well.  Lita Mains [facility] 01/20/2023, 9:08 PM

## 2023-01-20 NOTE — Progress Notes (Signed)
Patient endorses visual hallucinations, but stated "I can't recall what I am seeing, but I know I'm seeing something."  Patient rated his anxiety 5/10, depression 2/10, denies pain, SI, HI, and AH. Patient receives scheduled medications and tolerates well, no side effects reported, support  and encouragement provided, and able to verbally contract for safety. Patient remains safe on the unit, Q 15 minutes safety checks maintained.

## 2023-01-21 DIAGNOSIS — F250 Schizoaffective disorder, bipolar type: Secondary | ICD-10-CM | POA: Diagnosis not present

## 2023-01-21 DIAGNOSIS — F259 Schizoaffective disorder, unspecified: Secondary | ICD-10-CM | POA: Diagnosis not present

## 2023-01-21 NOTE — Progress Notes (Signed)
[facility] MD Progress Note  01/21/2023 2:57 PM Mark Hammond  MRN:  [id number] Principal Problem: Schizoaffective disorder Diagnosis: Principal Problem:   Schizoaffective disorder Active Problems:   Cocaine abuse with cocaine-induced disorder   Alcohol use disorder, severe, dependence   Anxiety state   Insomnia  Reason for Admission: Mark Hammond is a 32 yo male with prior mental health diagnoses of paranoid schizophrenia, cocaine use d/o & alcohol use d/o who was taken to the [facility] ER on 4/12 after bystanders called 911 to report that pt was exhibiting concerning and, bizarre behaviors such as drinking water from the potholes on the street. While at the ER,pt reported +SI, denied having a plan, endorsed +AH & alcohol and cocaine use. He was observed to have self inflicted cuts to his abdomen and b/l arms. Pt was transferred and admitted to this [facility] voluntarily for treatment and stabilization of his mental status.   24 hr chart review: Sleep Hours last night: 11.25 hrs Nursing Concerns:  reported visual hallucinations last night to nursing. Behavioral episodes in the past 24 hrs: Cussing at staff yesterday evening due to room lock out order, later apologetic to RN. Medication Compliance: compliant  Vital Signs in the past 24 hrs: WNL PRN Medications in the past 24 hrs: none  Patient assessment note: Pt reports +AH during encounter today, stated that he was hearing "murmuring" in his room, denies SI, denies HI, denies VH, denies paranoia and denies first rank symptoms. Mood has significantly improved as per both subjective and objective assessments since admission. Pt reports a good appetite, states that he was able to fall asleep easily, but had some trouble staying asleep.   Pt is currently on Trazodone 150 mg along with Zyprexa 10 mg BID and Haldol 10 mg BID. We are keeping medications as above, and will plan to discharge him on Monday, 4/22. Pt will be notified to make arrangements for  where to reside after discharge, otherwise we will discharge to the [facility]. Tentative discharge date is Monday if f/u appointments have been made. He denies being in any physical pain today. There are concerns from nursing and writer that pt might be over reporting his symptoms in an effort of secondary gains of shelter and food by remaining hospitalized. No TD/EPS type symptoms found on assessment, and pt denies any feelings of stiffness. AIMS: 0.   Total Time spent with patient: 45 minutes  Past Psychiatric History: See H & P  Past Medical History:  Past Medical History:  Diagnosis Date   Alcoholic gastritis    Bipolar 1 disorder    Dental abscess    Depression    Drug abuse    ETOH abuse    Hallucination    Schizo affective schizophrenia    No past surgical history on file. Family History:  Family History  Problem Relation Age of Onset   Stroke Other    Family Psychiatric  History: See H & P Social History:  Social History   Substance and Sexual Activity  Alcohol Use Yes   Comment: 4x 40oz beers daily     Social History   Substance and Sexual Activity  Drug Use Yes   Frequency: 7.0 times per week   Types: "Crack" cocaine, Heroin, MDMA (Ecstacy), Marijuana, Cocaine   Comment: occasional stimulant use    Social History   Socioeconomic History   Marital status: Single    Spouse name: Not on file   Number of children: Not on file  Years of education: Not on file   Highest education level: Not on file  Occupational History   Not on file  Tobacco Use   Smoking status: Every Day    Packs/day: 1    Types: Cigarettes   Smokeless tobacco: Never  Vaping Use   Vaping Use: Every day   Substances: THC  Substance and Sexual Activity   Alcohol use: Yes    Comment: 4x 40oz beers daily   Drug use: Yes    Frequency: 7.0 times per week    Types: "Crack" cocaine, Heroin, MDMA (Ecstacy), Marijuana, Cocaine    Comment: occasional stimulant use   Sexual activity:  Yes  Other Topics Concern   Not on file  Social History Narrative   Pt lives in [place] with ex-girlfriend.  Pt is not followed by an outpatient psychiatric provider.   Social Determinants of Health   Financial Resource Strain: Not on file  Food Insecurity: Food Insecurity Present (01/13/2023)   Hunger Vital Sign    Worried About Running Out of Food in the Last Year: Often true    Ran Out of Food in the Last Year: Sometimes true  Transportation Needs: Unmet Transportation Needs (01/13/2023)   PRAPARE - Administrator, Civil Service (Medical): Yes    Lack of Transportation (Non-Medical): Yes  Physical Activity: Not on file  Stress: Not on file  Social Connections: Not on file   Sleep: Fair  Appetite:  Good  Current Medications: Current Facility-Administered Medications  Medication Dose Route Frequency Provider Last Rate Last Admin   acetaminophen (TYLENOL) tablet 650 mg  650 mg Oral Q6H PRN Motley-Mangrum, Jadeka A, PMHNP   650 mg at 01/18/23 2104   alum & mag hydroxide-simeth (MAALOX/MYLANTA) 200-200-20 MG/5ML suspension 30 mL  30 mL Oral Q4H PRN Motley-Mangrum, Jadeka A, PMHNP       benztropine (COGENTIN) tablet 0.5 mg  0.5 mg Oral BID Gladyse Corvin, NP   0.5 mg at 01/21/23 1191   cholecalciferol (VITAMIN D3) 25 MCG (1000 UNIT) tablet 1,000 Units  1,000 Units Oral Daily Starleen Blue, NP   1,000 Units at 01/20/23 2048   diphenhydrAMINE (BENADRYL) capsule 50 mg  50 mg Oral TID PRN Motley-Mangrum, Jadeka A, PMHNP   50 mg at 01/18/23 1105   Or   diphenhydrAMINE (BENADRYL) injection 50 mg  50 mg Intramuscular TID PRN Motley-Mangrum, Jadeka A, PMHNP       feeding supplement (ENSURE ENLIVE / ENSURE PLUS) liquid 237 mL  237 mL Oral BID BM Goli, Veeraindar, MD   237 mL at 01/21/23 4782   haloperidol (HALDOL) 2 MG/ML solution 10 mg  10 mg Oral BID Starleen Blue, NP   10 mg at 01/21/23 9562   haloperidol (HALDOL) tablet 5 mg  5 mg Oral TID PRN Motley-Mangrum, Jadeka A,  PMHNP   5 mg at 01/18/23 1105   Or   haloperidol lactate (HALDOL) injection 5 mg  5 mg Intramuscular TID PRN Motley-Mangrum, Jadeka A, PMHNP       hydrOXYzine (ATARAX) tablet 25 mg  25 mg Oral TID PRN Motley-Mangrum, Jadeka A, PMHNP   25 mg at 01/17/23 1746   magnesium hydroxide (MILK OF MAGNESIA) suspension 30 mL  30 mL Oral Daily PRN Motley-Mangrum, Jadeka A, PMHNP   30 mL at 01/20/23 1315   menthol-cetylpyridinium (CEPACOL) lozenge 3 mg  1 lozenge Oral PRN Starleen Blue, NP       multivitamin with minerals tablet 1 tablet  1 tablet Oral Daily  Motley-Mangrum, Ezra Sites, PMHNP   1 tablet at 01/21/23 0807   OLANZapine zydis (ZYPREXA) disintegrating tablet 10 mg  10 mg Oral BID Starleen Blue, NP   10 mg at 01/21/23 4132   thiamine (Vitamin B-1) tablet 100 mg  100 mg Oral Daily Motley-Mangrum, Jadeka A, PMHNP   100 mg at 01/21/23 4401   traZODone (DESYREL) tablet 150 mg  150 mg Oral QHS Starleen Blue, NP   150 mg at 01/20/23 2048    Lab Results:  No results found for this or any previous visit (from the past 48 hour(s)).   Blood Alcohol level:  Lab Results  Component Value Date   ETH 163 (H) 01/13/2023   ETH 198 (H) 02/11/2022    Metabolic Disorder Labs: Lab Results  Component Value Date   HGBA1C 5.7 (H) 01/14/2023   MPG 116.89 01/14/2023   MPG 111.15 01/13/2023   Lab Results  Component Value Date   PROLACTIN 31.4 (H) 11/16/2019   Lab Results  Component Value Date   CHOL 126 01/14/2023   TRIG 65 01/14/2023   HDL 56 01/14/2023   CHOLHDL 2.3 01/14/2023   VLDL 13 01/14/2023   LDLCALC 57 01/14/2023   LDLCALC 58 01/13/2023    Physical Findings: AIMS: Facial and Oral Movements Muscles of Facial Expression: None, normal Lips and Perioral Area: None, normal Jaw: None, normal Tongue: None, normal,Extremity Movements Upper (arms, wrists, hands, fingers): None, normal Lower (legs, knees, ankles, toes): None, normal, Trunk Movements Neck, shoulders, hips: None, normal,  Overall Severity Severity of abnormal movements (highest score from questions above): None, normal Incapacitation due to abnormal movements: None, normal Patient's awareness of abnormal movements (rate only patient's report): No Awareness, Dental Status Current problems with teeth and/or dentures?: No Does patient usually wear dentures?: No  CIWA:  CIWA-Ar Total: 0 COWS:     Musculoskeletal: Strength & Muscle Tone: within normal limits Gait & Station: normal Patient leans: N/A  Psychiatric Specialty Exam:  Presentation  General Appearance:  Appropriate for Environment; Fairly Groomed  Eye Contact: Good  Speech: Clear and Coherent  Speech Volume: Normal  Handedness: Right   Mood and Affect  Mood: Depressed  Affect: Congruent   Thought Process  Thought Processes: Coherent  Descriptions of Associations:Intact  Orientation:Full (Time, Place and Person)  Thought Content:Logical  History of Schizophrenia/Schizoaffective disorder:Yes  Duration of Psychotic Symptoms:Greater than six months  Hallucinations:Hallucinations: Auditory Description of Auditory Hallucinations: 'mumbling'   Ideas of Reference:None  Suicidal Thoughts:Suicidal Thoughts: No   Homicidal Thoughts:Homicidal Thoughts: No    Sensorium  Memory: Immediate Good  Judgment: Fair  Insight: Fair   Chartered certified accountant: Fair  Attention Span: Fair  Recall: Fair  Fund of Knowledge: Fair  Language: Fair   Psychomotor Activity  Psychomotor Activity: Psychomotor Activity: Normal    Assets  Assets: Manufacturing systems engineer; Resilience   Sleep  Sleep: Sleep: Fair     Physical Exam: Physical Exam Constitutional:      Appearance: Normal appearance.  HENT:     Head: Normocephalic.     Nose: Nose normal. No congestion.  Eyes:     Pupils: Pupils are equal, round, and reactive to light.  Pulmonary:     Effort: Pulmonary effort is normal.   Musculoskeletal:        General: Normal range of motion.     Cervical back: Normal range of motion.  Neurological:     Mental Status: He is alert and oriented to person, place, and time.  Review of Systems  Constitutional:  Negative for fever.  HENT:  Negative for hearing loss.   Eyes:  Negative for blurred vision.  Respiratory:  Negative for cough.   Cardiovascular:  Negative for chest pain.  Gastrointestinal:  Negative for heartburn.  Genitourinary:  Negative for dysuria.  Musculoskeletal:  Negative for myalgias.  Skin:  Negative for rash.  Neurological:  Negative for dizziness.  Psychiatric/Behavioral:  Positive for depression, hallucinations and substance abuse. Negative for memory loss and suicidal ideas. The patient is nervous/anxious and has insomnia.    Blood pressure 121/80, pulse 100, temperature 98.6 F (37 C), temperature source Oral, resp. rate 14, height 5\' 4"  (1.626 m), weight 56.5 kg, SpO2 98 %. Body mass index is 21.39 kg/m.  Treatment Plan Summary: Daily contact with patient to assess and evaluate symptoms and progress in treatment and Medication management   Observation Level/Precautions:  15 minute checks  Laboratory:  Labs reviewed: Potassium level on admission was 3.2, ordered 40 mEq x 2 doses, and repeated level and it is now WNL.  LFTs are also elevated, repeated to see and trending downwards.  This is most likely related to chronic alcoholism.  Psychotherapy:  Unit Group sessions  Medications:  See [facility]  Consultations:  To be determined   Discharge Concerns:  Safety, medication compliance, mood stability  Estimated LOS: 5-7 days  Other:  N/A    PLAN Safety and Monitoring: Voluntary admission to inpatient psychiatric unit for safety, stabilization and treatment Daily contact with patient to assess and evaluate symptoms and progress in treatment Patient's case to be discussed in multi-disciplinary team meeting Observation Level : q15 minute  checks Vital signs: q12 hours Precautions: Safety    Long Term Goal(s): Improvement in symptoms so as ready for discharge   Short Term Goals: Ability to identify changes in lifestyle to reduce recurrence of condition will improve, Ability to verbalize feelings will improve, Ability to disclose and discuss suicidal ideas, Ability to demonstrate self-control will improve, Ability to identify and develop effective coping behaviors will improve, Ability to maintain clinical measurements within normal limits will improve, Compliance with prescribed medications will improve, and Ability to identify triggers associated with substance abuse/mental health issues will improve   Diagnoses:  Principal Problem:   Schizoaffective disorder Active Problems:   Cocaine abuse with cocaine-induced disorder   Alcohol use disorder, severe, dependence   Anxiety state   Insomnia   Medications -Continue Haldol to 10 mg BID for psychosis (switched to Haldol solution due to concerns for med cheeking )  -Continue Cogentin 0.5 mg BID for EPS prophylaxis -Continue Zyprexa to 10 mg BID for psychosis/mood stabilization  -Continue Vitamin D3 daily for vit D deficiency -Ativan detox protocol completed -Continue trazodone 150 mg nightly for sleep -Continue agitation protocol: Ativan/Haldol/Benadryl every 6 hours as needed-See AR for complete order    PRNS -Continue Hydroxyzine 25 mg every 6 hours PRN -Continue Tylenol 650 mg every 6 hours PRN for mild pain -Continue Maalox 30 mg every 4 hrs PRN for indigestion -Continue Imodium 2-4 mg as needed for diarrhea -Continue Milk of Magnesia as needed every 6 hrs for constipation -Continue Zofran disintegrating tabs every 6 hrs PRN for nausea   Discharge Planning: Social work and case management to assist with discharge planning and identification of hospital follow-up needs prior to discharge Estimated LOS: 5-7 days Discharge Concerns: Need to establish a safety plan;  Medication compliance and effectiveness Discharge Goals: Return home with outpatient referrals for mental health follow-up including  medication management/psychotherapy   Starleen Blue, NP 01/21/2023, 2:57 PMPatient ID: [name], male   DOB: 02/24/1991, 32 y.o.   MRN: [id number] Patient ID: Sophie Quiles, male   DOB: May 31, 1991, 32 y.o.   MRN: [id number] Patient ID: Victoriano Campion, male   DOB: Dec 26, 1990, 32 y.o.   MRN: [id number] Patient ID: Tamel Abel, male   DOB: 09-04-1991, 32 y.o.   MRN: [id number] Patient ID: Shaquill Iseman, male   DOB: 1991/05/10, 32 y.o.   MRN: [id number]

## 2023-01-21 NOTE — Group Note (Signed)
Date:  01/21/2023 Time:  9:11 PM  Group Topic/Focus:  Wrap-Up Group:   The focus of this group is to help patients review their daily goal of treatment and discuss progress on daily workbooks.    Participation Level:  Active  Participation Quality:  Appropriate  Affect:  Appropriate  Cognitive:  Appropriate  Insight: Appropriate  Engagement in Group:  Engaged  Modes of Intervention:  Education and Exploration  Additional Comments:  Patient attended and participated in group tonight. He reports thaat he love that he is a listening ear and love making otherpeople smile.  Lita Mains [facility] 01/21/2023, 9:11 PM

## 2023-01-21 NOTE — Progress Notes (Signed)
   01/21/23 0600  15 Minute Checks  Location Bedroom  Visual Appearance Calm  Behavior Sleeping  Sleep (Behavioral Health Patients Only)  Calculate sleep? (Click Yes once per 24 hr at 0600 safety check) Yes  Documented sleep last 24 hours 11.25

## 2023-01-22 ENCOUNTER — Encounter (HOSPITAL_COMMUNITY): Payer: Self-pay | Admitting: Psychiatry

## 2023-01-22 DIAGNOSIS — F258 Other schizoaffective disorders: Secondary | ICD-10-CM

## 2023-01-22 NOTE — Progress Notes (Signed)
   01/22/23 2114  Psych Admission Type (Psych Patients Only)  Admission Status Voluntary  Psychosocial Assessment  Patient Complaints None  Eye Contact Fair  Facial Expression Animated  Affect Appropriate to circumstance  Speech Logical/coherent  Interaction Minimal  Motor Activity Other (Comment)  Appearance/Hygiene In scrubs  Behavior Characteristics Cooperative  Mood Pleasant  Thought Process  Coherency WDL  Content WDL  Delusions None reported or observed  Perception Hallucinations  Hallucination Auditory;Visual (pt reports voices are muffled now)  Judgment Poor  Confusion None  Danger to Self  Current suicidal ideation? Denies  Agreement Not to Harm Self Yes  Description of Agreement verbally contracts  Danger to Others  Danger to Others None reported or observed

## 2023-01-22 NOTE — [facility] Group Notes (Signed)
Adult Psychoeducational Group Note  Date:  01/22/2023 Time:  9:01 PM  Group Topic/Focus:  Wrap-Up Group:   The focus of this group is to help patients review their daily goal of treatment and discuss progress on daily workbooks.  Participation Level:  Active  Participation Quality:  Attentive  Affect:  Appropriate  Cognitive:  Appropriate  Insight: Good  Engagement in Group:  Engaged  Modes of Intervention:  Discussion  Additional Comments:   Pt states that he's had a good day but is still trying to get used to going down to meals during the day. Pt offered verbal encouragement by Clinical research associate. Pt states he's struggling with finding a proper routine and incorporate exercise. Pt endorsed anxiety at a 4/10, depression at a 6/10 (without a specific reason) and states that he hears muffled voices at random times throughout the day. Pt uses physical activity and drawing as coping skills.  Vevelyn Pat 01/22/2023, 9:01 PM

## 2023-01-22 NOTE — Progress Notes (Signed)
[facility] MD Progress Note  01/22/2023 8:35 AM Mark Hammond  MRN:  [id number] Principal Problem: Schizoaffective disorder Diagnosis: Principal Problem:   Schizoaffective disorder Active Problems:   Cocaine abuse with cocaine-induced disorder   Alcohol use disorder, severe, dependence   Anxiety state   Insomnia  Reason for Admission: Mark Hammond is a 32 yo male with past psychiatric history of paranoid schizophrenia, cocaine use disorder, alcohol use disorder, anxiety, methamphetamine use disorder severe, substance induced mood disorder, polysubstance dependence daily use who initially presented to [facility] ED on 4/12 after bystanders called 911 to report that pt was exhibiting concerning and, bizarre behaviors such as drinking water from the potholes on the street. While at the ER, pt reported +SI, denied having a plan, endorsed +AH & alcohol and cocaine use. He was observed to have self inflicted cuts to his abdomen and b/l arms. He was transferred to [facility] 01/13/23 voluntarily for further treatment and stabilization of his mental status.   24 hr chart review: Staff report patient has been intermittently visible in milieu spending a lot of time in his room. Minimal attendance in unit groups and activities. No observations noted of patient responding to any external/internal stimuli. Staff note patient has been medication compliant expressed concern for possible medication seeking behaviors. PRNs include Acetaminophen 650 mg for mild pain. Slept overnight 9.25 hours. Concern for overpresentation of symptoms. Plan to discharge on 2 anti-psychotics this week; titrate off one on outpatient.   Patient assessment note: Patient observed laying in bed where he was easily aroused. He presented alert and oriented. Calm and cooperative. Dysphoric mood; blunt, congruent affect that brightened slightly on approach. He reports feeling 'better' and asked provider to have staff 'call [place] and tell them I'm not  hearing any voices anymore, only mumbling so I'm not hallucinating'. Provider discussed quality of 'mumbles' that he described as being chronic and explained that those are considered auditory hallucinations. He reports eating and sleeping 'good'. States he is 'ready for discharge' and inquired if he could 'go tomorrow'. He denies any SI/HI/AVH or side effects of medications. As provider was talking with him he was observed putting on remainder of his clothes stating plan to take a shower. He denies any safety concerns and Contracts for safety. Social work continues to work on Engineering geologist. Case was discussed with attending MD Mark Hammond; treatment plan noted below.   Total Time spent with patient: 45 minutes  Past Psychiatric History: See H & P  Past Medical History:  Past Medical History:  Diagnosis Date   Alcoholic gastritis    Bipolar 1 disorder    Dental abscess    Depression    Drug abuse    ETOH abuse    Hallucination    Schizo affective schizophrenia    No past surgical history on file. Family History:  Family History  Problem Relation Age of Onset   Stroke Other    Family Psychiatric  History: See H & P Social History:  Social History   Substance and Sexual Activity  Alcohol Use Yes   Comment: 4x 40oz beers daily     Social History   Substance and Sexual Activity  Drug Use Yes   Frequency: 7.0 times per week   Types: "Crack" cocaine, Heroin, MDMA (Ecstacy), Marijuana, Cocaine   Comment: occasional stimulant use    Social History   Socioeconomic History   Marital status: Single    Spouse name: Not on file   Number of children: Not on file  Years of education: Not on file   Highest education level: Not on file  Occupational History   Not on file  Tobacco Use   Smoking status: Every Day    Packs/day: 1    Types: Cigarettes   Smokeless tobacco: Never  Vaping Use   Vaping Use: Every day   Substances: THC  Substance and Sexual Activity   Alcohol use: Yes     Comment: 4x 40oz beers daily   Drug use: Yes    Frequency: 7.0 times per week    Types: "Crack" cocaine, Heroin, MDMA (Ecstacy), Marijuana, Cocaine    Comment: occasional stimulant use   Sexual activity: Yes  Other Topics Concern   Not on file  Social History Narrative   Pt lives in [place] with ex-girlfriend.  Pt is not followed by an outpatient psychiatric provider.   Social Determinants of Health   Financial Resource Strain: Not on file  Food Insecurity: Food Insecurity Present (01/13/2023)   Hunger Vital Sign    Worried About Running Out of Food in the Last Year: Often true    Ran Out of Food in the Last Year: Sometimes true  Transportation Needs: Unmet Transportation Needs (01/13/2023)   PRAPARE - Administrator, Civil Service (Medical): Yes    Lack of Transportation (Non-Medical): Yes  Physical Activity: Not on file  Stress: Not on file  Social Connections: Not on file   Sleep: Fair  Appetite:  Good  Current Medications: Current Facility-Administered Medications  Medication Dose Route Frequency Provider Last Rate Last Admin   acetaminophen (TYLENOL) tablet 650 mg  650 mg Oral Q6H PRN Motley-Mangrum, Jadeka A, PMHNP   650 mg at 01/18/23 2104   alum & mag hydroxide-simeth (MAALOX/MYLANTA) 200-200-20 MG/5ML suspension 30 mL  30 mL Oral Q4H PRN Motley-Mangrum, Jadeka A, PMHNP       benztropine (COGENTIN) tablet 0.5 mg  0.5 mg Oral BID Starleen Blue, NP   0.5 mg at 01/22/23 0731   cholecalciferol (VITAMIN D3) 25 MCG (1000 UNIT) tablet 1,000 Units  1,000 Units Oral Daily Starleen Blue, NP   1,000 Units at 01/21/23 2118   diphenhydrAMINE (BENADRYL) capsule 50 mg  50 mg Oral TID PRN Motley-Mangrum, Jadeka A, PMHNP   50 mg at 01/18/23 1105   Or   diphenhydrAMINE (BENADRYL) injection 50 mg  50 mg Intramuscular TID PRN Motley-Mangrum, Jadeka A, PMHNP       feeding supplement (ENSURE ENLIVE / ENSURE PLUS) liquid 237 mL  237 mL Oral BID BM Goli, Veeraindar, MD   237 mL  at 01/21/23 1417   haloperidol (HALDOL) 2 MG/ML solution 10 mg  10 mg Oral BID Starleen Blue, NP   10 mg at 01/22/23 0732   haloperidol (HALDOL) tablet 5 mg  5 mg Oral TID PRN Motley-Mangrum, Jadeka A, PMHNP   5 mg at 01/18/23 1105   Or   haloperidol lactate (HALDOL) injection 5 mg  5 mg Intramuscular TID PRN Motley-Mangrum, Jadeka A, PMHNP       hydrOXYzine (ATARAX) tablet 25 mg  25 mg Oral TID PRN Motley-Mangrum, Jadeka A, PMHNP   25 mg at 01/17/23 1746   magnesium hydroxide (MILK OF MAGNESIA) suspension 30 mL  30 mL Oral Daily PRN Motley-Mangrum, Jadeka A, PMHNP   30 mL at 01/20/23 1315   menthol-cetylpyridinium (CEPACOL) lozenge 3 mg  1 lozenge Oral PRN Starleen Blue, NP       multivitamin with minerals tablet 1 tablet  1 tablet Oral Daily  Motley-Mangrum, Ezra Sites, PMHNP   1 tablet at 01/22/23 0731   OLANZapine zydis (ZYPREXA) disintegrating tablet 10 mg  10 mg Oral BID Starleen Blue, NP   10 mg at 01/22/23 0731   thiamine (Vitamin B-1) tablet 100 mg  100 mg Oral Daily Motley-Mangrum, Jadeka A, PMHNP   100 mg at 01/22/23 0731   traZODone (DESYREL) tablet 150 mg  150 mg Oral QHS Starleen Blue, NP   150 mg at 01/21/23 2118    Lab Results:  No results found for this or any previous visit (from the past 48 hour(s)).   Blood Alcohol level:  Lab Results  Component Value Date   ETH 163 (H) 01/13/2023   ETH 198 (H) 02/11/2022    Metabolic Disorder Labs: Lab Results  Component Value Date   HGBA1C 5.7 (H) 01/14/2023   MPG 116.89 01/14/2023   MPG 111.15 01/13/2023   Lab Results  Component Value Date   PROLACTIN 31.4 (H) 11/16/2019   Lab Results  Component Value Date   CHOL 126 01/14/2023   TRIG 65 01/14/2023   HDL 56 01/14/2023   CHOLHDL 2.3 01/14/2023   VLDL 13 01/14/2023   LDLCALC 57 01/14/2023   LDLCALC 58 01/13/2023    Physical Findings: AIMS: Facial and Oral Movements Muscles of Facial Expression: None, normal Lips and Perioral Area: None, normal Jaw: None,  normal Tongue: None, normal,Extremity Movements Upper (arms, wrists, hands, fingers): None, normal Lower (legs, knees, ankles, toes): None, normal, Trunk Movements Neck, shoulders, hips: None, normal, Overall Severity Severity of abnormal movements (highest score from questions above): None, normal Incapacitation due to abnormal movements: None, normal Patient's awareness of abnormal movements (rate only patient's report): No Awareness, Dental Status Current problems with teeth and/or dentures?: No Does patient usually wear dentures?: No  CIWA:  CIWA-Ar Total: 0 COWS:     Musculoskeletal: Strength & Muscle Tone: within normal limits Gait & Station: normal Patient leans: N/A  Psychiatric Specialty Exam:  Presentation  General Appearance:  Appropriate for Environment; Fairly Groomed  Eye Contact: Good  Speech: Clear and Coherent  Speech Volume: Normal  Handedness: Right   Mood and Affect  Mood: Depressed  Affect: Congruent   Thought Process  Thought Processes: Coherent  Descriptions of Associations:Intact  Orientation:Full (Time, Place and Person)  Thought Content:Logical  History of Schizophrenia/Schizoaffective disorder:Yes  Duration of Psychotic Symptoms:Greater than six months  Hallucinations:Hallucinations: Auditory   Ideas of Reference:None  Suicidal Thoughts:Suicidal Thoughts: No   Homicidal Thoughts:Homicidal Thoughts: No    Sensorium  Memory: Immediate Good  Judgment: Fair  Insight: Fair   Chartered certified accountant: Fair  Attention Span: Fair  Recall: Fair  Fund of Knowledge: Fair  Language: Fair   Psychomotor Activity  Psychomotor Activity: Psychomotor Activity: Normal    Assets  Assets: Manufacturing systems engineer; Resilience   Sleep  Sleep: Sleep: Fair     Physical Exam: Physical Exam Constitutional:      Appearance: Normal appearance.  HENT:     Head: Normocephalic.     Nose: Nose  normal. No congestion.  Eyes:     Pupils: Pupils are equal, round, and reactive to light.  Pulmonary:     Effort: Pulmonary effort is normal.  Musculoskeletal:        General: Normal range of motion.     Cervical back: Normal range of motion.  Neurological:     Mental Status: He is alert and oriented to person, place, and time.  Psychiatric:  Attention and Perception: Attention normal. He perceives auditory hallucinations. He does not perceive visual hallucinations.        Mood and Affect: Affect is blunt.        Speech: Speech normal.        Behavior: Behavior is withdrawn. Behavior is cooperative.        Thought Content: Thought content normal. Thought content is not paranoid or delusional. Thought content does not include homicidal or suicidal ideation. Thought content does not include homicidal or suicidal plan.        Cognition and Memory: Cognition and memory normal.        Judgment: Judgment normal.    Review of Systems  Constitutional:  Negative for fever.  HENT:  Negative for hearing loss.   Eyes:  Negative for blurred vision.  Respiratory:  Negative for cough.   Cardiovascular:  Negative for chest pain.  Gastrointestinal:  Negative for heartburn.  Genitourinary:  Negative for dysuria.  Musculoskeletal:  Negative for myalgias.  Skin:  Negative for rash.  Neurological:  Negative for dizziness.  Psychiatric/Behavioral:  Positive for hallucinations and substance abuse. Negative for memory loss and suicidal ideas.    Blood pressure 126/82, pulse 84, temperature 97.7 F (36.5 C), temperature source Oral, resp. rate 14, height 5\' 4"  (1.626 m), weight 56.5 kg, SpO2 99 %. Body mass index is 21.39 kg/m.  Treatment Plan Summary: Daily contact with patient to assess and evaluate symptoms and progress in treatment and Medication management   Observation Level/Precautions:  15 minute checks  Laboratory:  Labs reviewed: Potassium level on admission was 3.2, ordered 40 mEq x  2 doses, and repeated level and it is now WNL.  LFTs are also elevated, repeated to see and trending downwards.  This is most likely related to chronic alcoholism.  Psychotherapy:  Unit Group sessions  Medications:  See [facility]  Consultations:  To be determined   Discharge Concerns:  Safety, medication compliance, mood stability  Estimated LOS: 5-7 days  Other:  N/A    PLAN Safety and Monitoring: Voluntary admission to inpatient psychiatric unit for safety, stabilization and treatment Daily contact with patient to assess and evaluate symptoms and progress in treatment Patient's case to be discussed in multi-disciplinary team meeting Observation Level : q15 minute checks Vital signs: q12 hours Precautions: Safety    Long Term Goal(s): Improvement in symptoms so as ready for discharge   Short Term Goals: Ability to identify changes in lifestyle to reduce recurrence of condition will improve, Ability to verbalize feelings will improve, Ability to disclose and discuss suicidal ideas, Ability to demonstrate self-control will improve, Ability to identify and develop effective coping behaviors will improve, Ability to maintain clinical measurements within normal limits will improve, Compliance with prescribed medications will improve, and Ability to identify triggers associated with substance abuse/mental health issues will improve   Diagnoses:  Principal Problem:   Schizoaffective disorder Active Problems:   Cocaine abuse with cocaine-induced disorder   Alcohol use disorder, severe, dependence   Anxiety state   Insomnia   Medications -Continue:  - Haldol to 10 mg BID for psychosis (switched to Haldol solution due to concerns for med cheeking )  - Cogentin 0.5 mg BID for EPS prophylaxis - Zyprexa to 10 mg BID for psychosis/mood stabilization  - Vitamin D3 daily for vit D deficiency  -Ativan Detox Protocol:  completed - Trazodone 150 mg nightly for sleep  -Agitation protocol: Ativan 2 mg  IM/PO, Haldol 5 mg IM/PO, Benadryl 50 IM/PO  every 6 hours as needed-See MAR for complete order    PRNs: Continue:  - Hydroxyzine 25 mg every 6 hours PRN -Tylenol 650 mg every 6 hours PRN for mild pain - Maalox 30 mg every 4 hrs PRN for indigestion - Imodium 2-4 mg as needed for diarrhea - Milk of Magnesia as needed every 6 hrs for constipation - Zofran disintegrating tabs every 6 hrs PRN for nausea   Discharge Planning: Social work and case management to assist with discharge planning and identification of hospital follow-up needs prior to discharge Estimated LOS: 5-7 days Discharge Concerns: Need to establish a safety plan; Medication compliance and effectiveness Discharge Goals: Return home with outpatient referrals for mental health follow-up including medication management/psychotherapy   Loletta Parish, NP 01/22/2023, 8:35 AM  Patient ID: Mark Hammond, male   DOB: [birth year], 32 y.o.   MRN: [id number]

## 2023-01-22 NOTE — Progress Notes (Signed)
Pt is A&OX4, calm, flat, denies suicidal ideations, denies homicidal ideations, denies auditory hallucinations and denies visual hallucinations. Pt verbally agrees to approach staff if these become apparent and before harming self or others. Pt denies experiencing nightmares. Mood and affect are congruent. Pt appetite is ok. No complaints of anxiety, distress, pain and/or discomfort at this time. Pt's memory appears to be grossly intact, and Pt hasn't displayed any injurious behaviors. Pt is medication compliant. There's no evidence of suicidal intent. Psychomotor activity was WNL. No s/s of Parkinson, Dystonia, Akathisia and/or Tardive Dyskinesia noted.   

## 2023-01-22 NOTE — Progress Notes (Signed)
   01/22/23 0900  Charting Type  Charting Type Shift assessment  Safety Check Verification  Has the RN verified the 15 minute safety check completion? Yes  Neurological  Neuro (WDL) WDL  HEENT  HEENT (WDL) X  Respiratory  Respiratory (WDL) WDL  Cardiac  Cardiac (WDL) WDL  Vascular  Vascular (WDL) WDL  Integumentary  Integumentary (WDL) X  Braden Scale (Ages 8 and up)  Sensory Perceptions 4  Moisture 4  Activity 4  Mobility 4  Nutrition 4  Friction and Shear 3  Braden Scale Score 23  Musculoskeletal  Musculoskeletal (WDL) WDL  Gastrointestinal  Gastrointestinal (WDL) WDL  Stool Characteristics  Bowel Incontinence No  GU Assessment  Genitourinary (WDL) WDL  Urine Characteristics  Urinary Incontinence No  Neurological  Level of Consciousness Alert   Patient remains guarded to spends most of his time in his room. He stated that too many people in the day room increases his hallucinations.

## 2023-01-22 NOTE — [facility] Group Notes (Signed)
Adult Psychoeducational Group Note  Date:  01/22/2023 Time:  6:50 PM  Group Topic/Focus:  Goals Group:   The focus of this group is to help patients establish daily goals to achieve during treatment and discuss how the patient can incorporate goal setting into their daily lives to aide in recovery. Orientation:   The focus of this group is to educate the patient on the purpose and policies of crisis stabilization and provide a format to answer questions about their admission.  The group details unit policies and expectations of patients while admitted.  Participation Level:  Did Not Attend  Participation Quality:    Affect:    Cognitive:    Insight:   Engagement in Group:    Modes of Intervention:    Additional Comments:    Sheran Lawless 01/22/2023, 6:50 PM

## 2023-01-23 DIAGNOSIS — F250 Schizoaffective disorder, bipolar type: Secondary | ICD-10-CM | POA: Diagnosis not present

## 2023-01-23 DIAGNOSIS — F259 Schizoaffective disorder, unspecified: Secondary | ICD-10-CM | POA: Diagnosis not present

## 2023-01-23 MED ORDER — OLANZAPINE 10 MG PO TBDP: 10.0000 mg | ORAL_TABLET | Freq: Two times a day (BID) | ORAL | 0 refills | Status: DC

## 2023-01-23 MED ORDER — BENZTROPINE MESYLATE 0.5 MG PO TABS: 0.5000 mg | ORAL_TABLET | Freq: Two times a day (BID) | ORAL | 0 refills | Status: DC

## 2023-01-23 MED ORDER — HYDROXYZINE HCL 25 MG PO TABS: 25.0000 mg | ORAL_TABLET | Freq: Three times a day (TID) | ORAL | 0 refills | Status: DC | PRN

## 2023-01-23 MED ORDER — TRAZODONE HCL 150 MG PO TABS: 150.0000 mg | ORAL_TABLET | Freq: Every day | ORAL | 0 refills | Status: DC

## 2023-01-23 MED ORDER — VITAMIN D3 25 MCG PO TABS: 1000.0000 [IU] | ORAL_TABLET | Freq: Every day | ORAL | 0 refills | Status: DC

## 2023-01-23 MED ORDER — HALOPERIDOL 10 MG PO TABS: 10.0000 mg | ORAL_TABLET | Freq: Two times a day (BID) | ORAL | 0 refills | Status: DC

## 2023-01-23 NOTE — Progress Notes (Signed)
Recreation Therapy Notes  INPATIENT RECREATION TR PLAN  Patient Details Name: Mark Hammond MRN: [id number] DOB: [birth year] Today's Date: 01/23/2023  Rec Therapy Plan Is patient appropriate for Therapeutic Recreation?: Yes Treatment times per week: about 3 days Estimated Length of Stay: 5-7 days TR Treatment/Interventions: Group participation (Comment)  Discharge Criteria Pt will be discharged from therapy if:: Discharged Treatment plan/goals/alternatives discussed and agreed upon by:: Patient/family  Discharge Summary Short term goals set: See patient care plan Short term goals met: Complete Progress toward goals comments: Groups attended Which groups?: Coping skills, Leisure education Reason goals not met: None Therapeutic equipment acquired: N/A Reason patient discharged from therapy: Discharge from hospital Pt/family agrees with progress & goals achieved: Yes Date patient discharged from therapy: 01/23/23   Timiya Howells-McCall, LRT,CTRS Luigi Stuckey A Kacee Koren-McCall 01/23/2023, 12:27 PM

## 2023-01-23 NOTE — Progress Notes (Signed)
   01/23/23 0557  15 Minute Checks  Location Bedroom  Visual Appearance Calm  Behavior Sleeping  Sleep (Behavioral Health Patients Only)  Calculate sleep? (Click Yes once per 24 hr at 0600 safety check) Yes  Documented sleep last 24 hours 10.5

## 2023-01-23 NOTE — Discharge Summary (Signed)
Physician Discharge Summary Note  Patient:  Mark Hammond is an 32 y.o., male MRN:  [id number] DOB:  10-04-1990 Patient phone:  [phone] (home)  Patient address:   [address] [place] [address],  Total Time spent with patient: 45 minutes  Date of Admission:  01/13/2023 Date of Discharge: 01/23/2023  Reason for Admission: Mark Hammond is a 32 yo male with past psychiatric history of paranoid schizophrenia, cocaine use disorder, alcohol use disorder, anxiety, methamphetamine use disorder severe, substance induced mood disorder, polysubstance dependence daily use who initially presented to [facility] ED on 4/12 after bystanders called 911 to report that pt was exhibiting concerning and, bizarre behaviors such as drinking water from the potholes on the street. While at the ER, pt reported +SI, denied having a plan, endorsed +AH & alcohol and cocaine use. He was observed to have self inflicted cuts to his abdomen and b/l arms. He was transferred to [facility] 01/13/23 voluntarily for further treatment and stabilization of his mental status.    Principal Problem: Schizoaffective disorder Discharge Diagnoses: Principal Problem:   Schizoaffective disorder Active Problems:   Cocaine abuse with cocaine-induced disorder   Alcohol use disorder, severe, dependence   Anxiety state   Insomnia  Past Psychiatric History: See H and P  Past Medical History:  Past Medical History:  Diagnosis Date   Alcoholic gastritis    Bipolar 1 disorder    Dental abscess    Depression    Drug abuse    ETOH abuse    Hallucination    Schizo affective schizophrenia    History reviewed. No pertinent surgical history. Family History:  Family History  Problem Relation Age of Onset   Stroke Other    Family Psychiatric  History: H & P Social History:  Social History   Substance and Sexual Activity  Alcohol Use Yes   Comment: 4x 40oz beers daily     Social History   Substance and Sexual Activity  Drug  Use Yes   Frequency: 7.0 times per week   Types: "Crack" cocaine, Heroin, MDMA (Ecstacy), Marijuana, Cocaine   Comment: occasional stimulant use    Social History   Socioeconomic History   Marital status: Single    Spouse name: Not on file   Number of children: Not on file   Years of education: Not on file   Highest education level: Not on file  Occupational History   Not on file  Tobacco Use   Smoking status: Every Day    Packs/day: 1    Types: Cigarettes   Smokeless tobacco: Never  Vaping Use   Vaping Use: Every day   Substances: THC  Substance and Sexual Activity   Alcohol use: Yes    Comment: 4x 40oz beers daily   Drug use: Yes    Frequency: 7.0 times per week    Types: "Crack" cocaine, Heroin, MDMA (Ecstacy), Marijuana, Cocaine    Comment: occasional stimulant use   Sexual activity: Yes  Other Topics Concern   Not on file  Social History Narrative   Pt lives in [place] with ex-girlfriend.  Pt is not followed by an outpatient psychiatric provider.   Social Determinants of Health   Financial Resource Strain: Not on file  Food Insecurity: Food Insecurity Present (01/13/2023)   Hunger Vital Sign    Worried About Running Out of Food in the Last Year: Often true    Ran Out of Food in the Last Year: Sometimes true  Transportation Needs: Unmet Transportation  Needs (01/13/2023)   PRAPARE - Administrator, Civil Service (Medical): Yes    Lack of Transportation (Non-Medical): Yes  Physical Activity: Not on file  Stress: Not on file  Social Connections: Not on file    Hospital Course:   During the patient's hospitalization, patient had extensive initial psychiatric evaluation, and follow-up psychiatric evaluations every day. Psychiatric diagnoses provided upon initial assessment are as listed above.   Patient's psychiatric medications were adjusted on admission as follows: -Continue Zyprexa 10 mg nightly for psychosis and start Zyprexa 5 mg in the mornings  for psychosis -Start Ativan detox protocol as per Mar with [facility] every 6 hours-See the mar for complete order -Continue trazodone 50 mg nightly for sleep as needed -Continue agitation protocol: Ativan/Haldol/Benadryl every 6 hours as needed-See AR for complete order   During the hospitalization, other adjustments were made to the patient's psychiatric medication regimen. Ativan detox protocol completed. Medications at discharge are as follows:   -Continue Haldol tabs 10 mg BID for psychosis (switched to Haldol solution due to concerns for med cheeking, but switched back to tablets prior to discharge)  -Continue Cogentin 0.5 mg BID for EPS prophylaxis   -Continue Zyprexa to 10 mg BID for psychosis/mood stabilization   During the course of this hospitalization, Patient's psychosis was resistant to one antipsychotic, and was persistent on Zyprexa alone. It became necessary to add 2nd antipsychotic (Haldol) for management of psychosis. Psychosis resolved with the addition of Haldol. It is our recommendation for outpatient provider to continue to monitor pt's symptoms, and determine when to gradually taper down to one antipsychotic. Pt has been educated on this as well and has verbalized understanding. He has also been educated that his psychosis is most likely related to his substance abuse.   -Continue Vitamin D3 daily for vit D deficiency -Continue trazodone 150 mg nightly for sleep  ANTIPSYCHOTIC CONSENT : We discussed the risks, benefits, side effects, and alternatives to THE PRESCRIBED ANTIPSYCHOTIC, including but not limited to, the risk of fatigue, sedation, metabolic syndrome, weight gain, movement abnormalities such as tremor & cogwheeling & tardive dyskinesia, temperature sensitivity, photosensitivity, blood pressure changes, heart rhythm effects, potential for medication interactions, and to not take these medications with alcohol or illicit drugs; informed consent was obtained. We discussed  the necessity for routine monitoring including rating scales of abnormal movements, blood work, and ekgs, while the patient is prescribed antipsychotic medication.    Patient's care was discussed during the interdisciplinary team meeting every day during the hospitalization. The patient denies having side effects to prescribed psychiatric medication. Gradually, patient started adjusting to milieu. The patient was evaluated each day by a clinical provider to ascertain response to treatment. Improvement was noted by the patient's report of decreasing symptoms, improved sleep and appetite, affect, medication tolerance, behavior, and participation in unit programming.  Patient was asked each day to complete a self inventory noting mood, mental status, pain, new symptoms, anxiety and concerns. Symptoms were reported as significantly decreased or resolved completely by discharge.    On day of discharge, the patient reports that their mood is stable. The patient denied having suicidal thoughts for more than 48 hours prior to discharge.  Patient denies having homicidal thoughts.  Patient denies having auditory hallucinations.  Patient denies any visual hallucinations or other symptoms of psychosis. The patient was motivated to continue taking medication with a goal of continued improvement in mental health.    The patient reports their target psychiatric symptoms of depression,  anxiety, insomnia, substance use withdrawal symptoms and psychosis responded well to the psychiatric medications, and the patient reports overall benefit from this psychiatric hospitalization. Supportive psychotherapy was provided to the patient. The patient also participated in regular group therapy while hospitalized. Coping skills, problem solving as well as relaxation therapies were also part of the unit programming.   Labs were reviewed with the patient, and abnormal results were discussed with the patient.   The patient is able to  verbalize their individual safety plan to this provider.   # It is recommended to the patient to continue psychiatric medications as prescribed, after discharge from the hospital.     # It is recommended to the patient to follow up with your outpatient psychiatric provider and PCP.   # It was discussed with the patient, the impact of alcohol, drugs, tobacco have been there overall psychiatric and medical wellbeing, and total abstinence from substance use was recommended the patient.ed.   # Prescriptions provided or sent directly to preferred pharmacy at discharge. Patient agreeable to plan. Given opportunity to ask questions. Appears to feel comfortable with discharge.    # In the event of worsening symptoms, the patient is instructed to call the crisis hotline, 911 and or go to the nearest ED for appropriate evaluation and treatment of symptoms. To follow-up with primary care provider for other medical issues, concerns and or health care needs   # Patient was discharged back to his GF's home with a plan to follow up as noted below.    Total Time spent with patient: 45 minutes Physical Findings: AIMS: Facial and Oral Movements Muscles of Facial Expression: None, normal Lips and Perioral Area: None, normal Jaw: None, normal Tongue: None, normal,Extremity Movements Upper (arms, wrists, hands, fingers): None, normal Lower (legs, knees, ankles, toes): None, normal, Trunk Movements Neck, shoulders, hips: None, normal, Overall Severity Severity of abnormal movements (highest score from questions above): None, normal Incapacitation due to abnormal movements: None, normal Patient's awareness of abnormal movements (rate only patient's report): No Awareness, Dental Status Current problems with teeth and/or dentures?: No Does patient usually wear dentures?: No  CIWA:  CIWA-Ar Total: 0 COWS:  0  Musculoskeletal: Strength & Muscle Tone: within normal limits Gait & Station: normal Patient leans:  N/A  Psychiatric Specialty Exam:  Presentation  General Appearance:  Appropriate for Environment; Fairly Groomed  Eye Contact: Fair  Speech: Clear and Coherent  Speech Volume: Normal  Handedness: Right   Mood and Affect  Mood: Euthymic  Affect: Congruent   Thought Process  Thought Processes: Coherent  Descriptions of Associations:Intact  Orientation:Full (Time, Place and Person)  Thought Content:Logical  History of Schizophrenia/Schizoaffective disorder:No  Duration of Psychotic Symptoms:Greater than six months  Hallucinations:Hallucinations: None Description of Auditory Hallucinations: mumbling  Ideas of Reference:None  Suicidal Thoughts:Suicidal Thoughts: No  Homicidal Thoughts:Homicidal Thoughts: No   Sensorium  Memory: Immediate Good  Judgment: Fair  Insight: Fair   Chartered certified accountant: Fair  Attention Span: Fair  Recall: [employer] of Knowledge: Fair  Language: Fair   Psychomotor Activity  Psychomotor Activity: Psychomotor Activity: Normal   Assets  Assets: Communication Skills   Sleep  Sleep: Sleep: Fair    Physical Exam: Physical Exam Review of Systems  Constitutional:  Negative for fever.  HENT:  Negative for hearing loss.   Eyes:  Negative for blurred vision.  Respiratory:  Negative for cough.   Cardiovascular:  Negative for chest pain.  Gastrointestinal:  Negative for heartburn.  Genitourinary:  Negative for dysuria.  Musculoskeletal:  Negative for myalgias.  Skin:  Negative for rash.  Neurological:  Positive for dizziness.  Psychiatric/Behavioral:  Positive for depression (resolving) and substance abuse (Educated on the need for cessation and on the negative impacts of substance use on his mental health, and verbalized understanding). Negative for hallucinations, memory loss and suicidal ideas. The patient is nervous/anxious (resolving) and has insomnia (resolving).    Blood  pressure 124/80, pulse 74, temperature 98.4 F (36.9 C), temperature source Oral, resp. rate 14, height  (1.626 m), weight 56.5 kg, SpO2 99 %. Body mass index is 21.39 kg/m.   Social History   Tobacco Use  Smoking Status Every Day   Packs/day: 1   Types: Cigarettes  Smokeless Tobacco Never   Tobacco Cessation:  A prescription for an FDA-approved tobacco cessation medication was offered at discharge and the patient refused-He stated that he does not intent to quit smoking cigarettes after discharge.  Blood Alcohol level:  Lab Results  Component Value Date   ETH 163 (H) 01/13/2023   ETH 198 (H) 02/11/2022   Metabolic Disorder Labs:  Lab Results  Component Value Date   HGBA1C 5.7 (H) 01/14/2023   MPG 116.89 01/14/2023   MPG 111.15 01/13/2023   Lab Results  Component Value Date   PROLACTIN 31.4 (H) 11/16/2019   Lab Results  Component Value Date   CHOL 126 01/14/2023   TRIG 65 01/14/2023   HDL 56 01/14/2023   CHOLHDL 2.3 01/14/2023   VLDL 13 01/14/2023   LDLCALC 57 01/14/2023   LDLCALC 58 01/13/2023   See Psychiatric Specialty Exam and Suicide Risk Assessment completed by Attending Physician prior to discharge.  Discharge destination:  Home  Is patient on multiple antipsychotic therapies at discharge:  No   Has Patient had three or more failed trials of antipsychotic monotherapy by history:  No  Recommended Plan for Multiple Antipsychotic Therapies: Taper to monotherapy as described:  O Outpatient mental health provider to monitor patient on the two antipsychotics, and determine when to start tapering. Recommended to taper down to one antipsychotic, as pt's psychosis is most likely being amplified by polysubstance abuse.   Allergies as of 01/23/2023       Reactions   Codeine Other (See Comments)   Patient states parents told him he was allergic at a young age.        Medication List     TAKE these medications      Indication  benztropine 0.5 MG  tablet Commonly known as: COGENTIN Take 1 tablet (0.5 mg total) by mouth 2 (two) times daily.  Indication: EPS prophylaxis   haloperidol 10 MG tablet Commonly known as: HALDOL Take 1 tablet (10 mg total) by mouth 2 (two) times daily.  Indication: mood stabilization   hydrOXYzine 25 MG tablet Commonly known as: ATARAX Take 1 tablet (25 mg total) by mouth 3 (three) times daily as needed for anxiety.  Indication: Feeling Anxious   OLANZapine zydis 10 MG disintegrating tablet Commonly known as: ZYPREXA Take 1 tablet (10 mg total) by mouth 2 (two) times daily.  Indication: psychosis/mood stabilization   traZODone 150 MG tablet Commonly known as: DESYREL Take 1 tablet (150 mg total) by mouth at bedtime.  Indication: Trouble Sleeping   vitamin D3 25 MCG tablet Commonly known as: CHOLECALCIFEROL Take 1 tablet (1,000 Units total) by mouth daily.  Indication: Vitamin D Deficiency        Follow-up Information     [facility] Go to.   Why: You have an in person hospital follow up appt on May 13th at 2pm for ongoing medication manaagement. Contact information: [address] [place] [address] [phone]         Center[facility]. Call.   Why: If interested in therapy services, please call to schedule an appointment for therapy services. Contact information: [address] [place], [place] [address] [phone]                Signed: Starleen Blue, NP 01/23/2023, 1:13 PM

## 2023-01-23 NOTE — Progress Notes (Signed)
Pt discharged to lobby. Pt was stable and appreciative at that time. All papers and prescriptions were given and valuables returned. Verbal understanding expressed. Denies SI/HI and A/VH. Pt given opportunity to express concerns and ask questions.  

## 2023-01-23 NOTE — Group Note (Signed)
Recreation Therapy Group Note   Group Topic:Coping Skills  Group Date: 01/23/2023 Start Time: 1022 End Time: 1105 Facilitators: Erikson Danzy-McCall, LRT,CTRS Location: [address] Dayroom   Goal Area(s) Addresses: Patient will define what a coping skill is. Patient will work to create a list of healthy coping skills beginning with each letter of the alphabet. Patient will successfully identify positive coping skills they can use post d/c.  Patient will acknowledge benefit(s) of using learned coping skills post d/c.  Group Description: Coping A to Z. Patient asked to identify what a coping skill is and when they use them. Patients with Clinical research associate discussed healthy versus unhealthy coping skills. Next patients were given a blank worksheet titled "Coping Skills A-Z" . Patients were instructed to come up with at least one positive coping skill per letter of the alphabet, addressing a specific challenge (ex: substance abuse, anger, anxiety, isolation, family, finances) patients were given 15 minutes to brainstorm, before ideas were presented to the large group. Patients and LRT debriefed on the importance of coping skill selection based on situation and back-up plans when a skill tried is not effective. At the end of group, patients were given an handout of alphabetized strategies to keep for future reference.   Affect/Mood: Appropriate   Participation Level: Engaged   Participation Quality: Independent   Behavior: Appropriate   Speech/Thought Process: Focused   Insight: Good   Judgement: Good   Modes of Intervention: Worksheet   Patient Response to Interventions:  Engaged   Education Outcome:  In group clarification offered    Clinical Observations/Individualized Feedback: Pt was bright and engaged throughout group.  Pt asked for clarification when needed.  Pt was attentive and appropriate.  Pt had to come up with coping skills for substance abuse.  Pt was able to identify  incorporate love, help others, flight over life (leave when others are doing drugs), express yourself, draw, question yourself, overcome obstacles, understand feelings, writing, exit the scene and zzzz for sleep.  Pt was also attentive throughout group session.    Plan: Continue to engage patient in RT group sessions 2-3x/week.   Lanae Federer-McCall, LRT,CTRS 01/23/2023 11:47 AM

## 2023-01-23 NOTE — Transportation (Signed)
01/23/2023  Mark Hammond DOB: 12-15-1990 MRN: [id number]   RIDER WAIVER AND RELEASE OF LIABILITY  For the purposes of helping with transportation needs, [facility] partners with outside transportation providers (taxi companies, [place], Catering manager.) to give [employer] patients or other approved people the choice of on-demand rides [employer]") to our buildings for non-emergency visits.  By using [employer], I, the person signing this document, on behalf of myself and/or any legal minors (in my care using the [employer]), agree:  Science writer given to me are supplied by independent, outside transportation providers who do not work for, or have any affiliation with, [employer]. [place] is not a transportation company. [facility] has no control over the quality or safety of the rides I get using [employer]. [place] has no control over whether any outside ride will happen on time or not. [facility] gives no guarantee on the reliability, quality, safety, or availability on any rides, or that no mistakes will happen. I know and accept that traveling by vehicle (car, truck, [employer]U, Zenaida Niece, bus, taxi, etc.) has risks of serious injuries such as disability, being paralyzed, and death. I know and agree the risk of using [employer] is mine alone, and not [employer]. Transport Services are provided "as is" and as are available. The transportation providers are in charge for all inspections and care of the vehicles used to provide these rides. I agree not to take legal action against [facility], its agents, employees, officers, directors, representatives, insurers, attorneys, assigns, successors, subsidiaries, and affiliates at any time for any reasons related directly or indirectly to using [employer]. I also agree not to take legal action against [facility] or its affiliates for any injury, death, or damage to property caused by or related to using  [employer]. I have read this Waiver and Release of Liability, and I understand the terms used in it and their legal meaning. This Waiver is freely and voluntarily given with the understanding that my right (or any legal minors) to legal action against  relating to [employer] is knowingly given up to use these services.   I attest that I read the Ride Waiver and Release of Liability to Mark Hammond, gave Mr. Matusek the opportunity to ask questions and answered the questions asked (if any). I affirm that Mark Hammond then provided consent for assistance with transportation.

## 2023-01-23 NOTE — Plan of Care (Signed)
Patient was able to identify positive replacements for unhealthy/harmful habits at completion of recreation therapy group sessions.   Mark Hammond, LRT,CTRS

## 2023-01-23 NOTE — BHH Suicide Risk Assessment (Signed)
Suicide Risk Assessment  Discharge Assessment    [facility] Discharge Suicide Risk Assessment   Principal Problem: Schizoaffective disorder Discharge Diagnoses: Principal Problem:   Schizoaffective disorder Active Problems:   Cocaine abuse with cocaine-induced disorder   Alcohol use disorder, severe, dependence   Anxiety state   Insomnia  Reason for Admission: Mark Hammond is a 32 yo male with past psychiatric history of paranoid schizophrenia, cocaine use disorder, alcohol use disorder, anxiety, methamphetamine use disorder severe, substance induced mood disorder, polysubstance dependence daily use who initially presented to [facility] ED on 4/12 after bystanders called 911 to report that pt was exhibiting concerning and, bizarre behaviors such as drinking water from the potholes on the street. While at the ER, pt reported +SI, denied having a plan, endorsed +AH & alcohol and cocaine use. He was observed to have self inflicted cuts to his abdomen and b/l arms. He was transferred to [facility] 01/13/23 voluntarily for further treatment and stabilization of his mental status.   Hospital Course: During the patient's hospitalization, patient had extensive initial psychiatric evaluation, and follow-up psychiatric evaluations every day. Psychiatric diagnoses provided upon initial assessment are as listed above.  Patient's psychiatric medications were adjusted on admission as follows: -Continue Zyprexa 10 mg nightly for psychosis and start Zyprexa 5 mg in the mornings for psychosis -Start Ativan detox protocol as per Mar with Ciwas every 6 hours-See the mar for complete order -Continue trazodone 50 mg nightly for sleep as needed -Continue agitation protocol: Ativan/Haldol/Benadryl every 6 hours as needed-See AR for complete order   During the hospitalization, other adjustments were made to the patient's psychiatric medication regimen. Ativan detox protocol completed. Medications at discharge are as  follows:  -Continue Haldol tabs 10 mg BID for psychosis (switched to Haldol solution due to concerns for med cheeking, but switched back to tablets prior to discharge)  -Continue Cogentin 0.5 mg BID for EPS prophylaxis  -Continue Zyprexa to 10 mg BID for psychosis/mood stabilization  During the course of this hospitalization, Patient's psychosis was resistant to one antipsychotic, and was persistent on Zyprexa alone. It became necessary to add 2nd antipsychotic (Haldol) for management of psychosis. Psychosis resolved with the addition of Haldol. It is our recommendation for outpatient provider to continue to monitor pt's symptoms, and determine when to gradually taper down to one antipsychotic. Pt has been educated on this as well and has verbalized understanding. He has also been educated that his psychosis is most likely related to his substance abuse.  -Continue Vitamin D3 daily for vit D deficiency -Continue trazodone 150 mg nightly for sleep  Patient's care was discussed during the interdisciplinary team meeting every day during the hospitalization. The patient denies having side effects to prescribed psychiatric medication. Gradually, patient started adjusting to milieu. The patient was evaluated each day by a clinical provider to ascertain response to treatment. Improvement was noted by the patient's report of decreasing symptoms, improved sleep and appetite, affect, medication tolerance, behavior, and participation in unit programming.  Patient was asked each day to complete a self inventory noting mood, mental status, pain, new symptoms, anxiety and concerns. Symptoms were reported as significantly decreased or resolved completely by discharge.   On day of discharge, the patient reports that their mood is stable. The patient denied having suicidal thoughts for more than 48 hours prior to discharge.  Patient denies having homicidal thoughts.  Patient denies having auditory hallucinations.   Patient denies any visual hallucinations or other symptoms of psychosis. The patient was  motivated to continue taking medication with a goal of continued improvement in mental health.   The patient reports their target psychiatric symptoms of depression, anxiety, insomnia, substance use withdrawal symptoms and psychosis responded well to the psychiatric medications, and the patient reports overall benefit from this psychiatric hospitalization. Supportive psychotherapy was provided to the patient. The patient also participated in regular group therapy while hospitalized. Coping skills, problem solving as well as relaxation therapies were also part of the unit programming.  Labs were reviewed with the patient, and abnormal results were discussed with the patient.  The patient is able to verbalize their individual safety plan to this provider.  # It is recommended to the patient to continue psychiatric medications as prescribed, after discharge from the hospital.    # It is recommended to the patient to follow up with your outpatient psychiatric provider and PCP.  # It was discussed with the patient, the impact of alcohol, drugs, tobacco have been there overall psychiatric and medical wellbeing, and total abstinence from substance use was recommended the patient.ed.  # Prescriptions provided or sent directly to preferred pharmacy at discharge. Patient agreeable to plan. Given opportunity to ask questions. Appears to feel comfortable with discharge.    # In the event of worsening symptoms, the patient is instructed to call the crisis hotline, 911 and or go to the nearest ED for appropriate evaluation and treatment of symptoms. To follow-up with primary care provider for other medical issues, concerns and or health care needs  # Patient was discharged back to his GF's home with a plan to follow up as noted below.   Total Time spent with patient: 45 minutes  Musculoskeletal: Strength & Muscle Tone:  within normal limits Gait & Station: normal Patient leans: N/A  Psychiatric Specialty Exam  Presentation  General Appearance:  Appropriate for Environment; Fairly Groomed  Eye Contact: Fair  Speech: Clear and Coherent  Speech Volume: Normal  Handedness: Right   Mood and Affect  Mood: Euthymic  Duration of Depression Symptoms: Greater than two weeks  Affect: Congruent   Thought Process  Thought Processes: Coherent  Descriptions of Associations:Intact  Orientation:Full (Time, Place and Person)  Thought Content:Logical  History of Schizophrenia/Schizoaffective disorder:No  Duration of Psychotic Symptoms:Greater than six months  Hallucinations:Hallucinations: None Description of Auditory Hallucinations: mumbling  Ideas of Reference:None  Suicidal Thoughts:Suicidal Thoughts: No  Homicidal Thoughts:Homicidal Thoughts: No   Sensorium  Memory: Immediate Good  Judgment: Fair  Insight: Fair   Chartered certified accountant: Fair  Attention Span: Fair  Recall: [employer] of Knowledge: Fair  Language: Fair   Psychomotor Activity  Psychomotor Activity: Psychomotor Activity: Normal   Assets  Assets: Communication Skills   Sleep  Sleep: Sleep: Fair   Physical Exam: Physical Exam Constitutional:      Appearance: Normal appearance.  HENT:     Nose: Nose normal. No congestion or rhinorrhea.  Eyes:     Pupils: Pupils are equal, round, and reactive to light.  Musculoskeletal:        General: Normal range of motion.     Cervical back: Normal range of motion.  Neurological:     Mental Status: He is oriented to person, place, and time.    Review of Systems  Constitutional:  Negative for fever.  HENT:  Negative for hearing loss.   Eyes:  Negative for blurred vision.  Respiratory:  Negative for cough.   Cardiovascular:  Negative for chest pain.  Gastrointestinal:  Negative for heartburn.  Genitourinary:  Negative for  dysuria.  Musculoskeletal:  Negative for myalgias.  Skin:  Negative for rash.  Neurological:  Negative for dizziness.  Psychiatric/Behavioral:  Positive for depression (Denies SI/HI/AVH, no evidence of psychosis and verbally contracts for safety outside of [facility]) and substance abuse. Negative for hallucinations, memory loss and suicidal ideas. The patient is nervous/anxious (resolving) and has insomnia (resolving).    Blood pressure 124/80, pulse 74, temperature 98.4 F (36.9 C), temperature source Oral, resp. rate 14, height  (1.626 m), weight 56.5 kg, SpO2 99 %. Body mass index is 21.39 kg/m.  Mental Status Per Nursing Assessment::   On Admission:  Suicidal ideation indicated by others, Suicidal ideation indicated by patient, Self-harm behaviors, Self-harm thoughts  Demographic Factors:  Male, Low socioeconomic status, and Unemployed  Loss Factors: Financial problems/change in socioeconomic status  Historical Factors: Family history of mental illness or substance abuse  Risk Reduction Factors:   NA  Continued Clinical Symptoms:  Alcohol/Substance Abuse/Dependencies  Cognitive Features That Contribute To Risk:  None    Suicide Risk:  Mild:  There are no identifiable suicide plans, no associated intent, mild dysphoria and related symptoms, good self-control (both objective and subjective assessment), few other risk factors, and identifiable protective factors, including available and accessible social support.   Plan Of Care/Follow-up recommendations:  Go to: [facility]  [address] [place] [address]  You have an in person hospital follow up appt on May 13th at 2pm for ongoing medication manaagement.   And  Call: Center, [facility]  [address] [place], [place] [address]  [phone]  If interested in therapy services, please call to schedule an appointment for therapy services.   Starleen Blue, NP 01/23/2023, 10:06 AM

## 2023-01-23 NOTE — Progress Notes (Signed)
  [facility] Adult Case Management Discharge Plan :  Will you be returning to the same living situation after discharge:  Yes,  shelter At discharge, do you have transportation home?: Yes,  taxi provided by hospital  Do you have the ability to pay for your medications: Yes,  insurance   Release of information consent forms completed and in the chart;  Patient's signature needed at discharge.  Patient to Follow up at:  Follow-up Information     [facility] Go to.   Why: You have an in person hospital follow up appt on May 13th at 2pm for ongoing medication manaagement. Contact information: [address] [place] [address] [phone]         Center, [facility]. Call.   Why: If interested in therapy services, please call to schedule an appointment for therapy services. Contact information: [address] [place], [place] [address] [phone]                 Next level of care provider has access to [facility] Link:no  Safety Planning and Suicide Prevention discussed: Yes,  done with patient, patient declined consents to speak to social support. Dr. notified     Has patient been referred to the Quitline?: Patient refused referral  Patient has been referred for addiction treatment: Pt. refused referral  Ophelia Sipe E Preslei Blakley, LCSW 01/23/2023, 10:04 AM

## 2023-01-25 ENCOUNTER — Ambulatory Visit (HOSPITAL_COMMUNITY)
Admission: EM | Admit: 2023-01-25 | Discharge: 2023-01-25 | Disposition: A | Discharge status: Home / Self Care | Payer: 59

## 2023-01-25 DIAGNOSIS — R4589 Other symptoms and signs involving emotional state: Secondary | ICD-10-CM

## 2023-01-25 DIAGNOSIS — Z765 Malingerer [conscious simulation]: Secondary | ICD-10-CM | POA: Diagnosis not present

## 2023-01-25 DIAGNOSIS — F1010 Alcohol abuse, uncomplicated: Secondary | ICD-10-CM

## 2023-01-25 DIAGNOSIS — Z5900 Homelessness unspecified: Secondary | ICD-10-CM

## 2023-01-25 NOTE — Discharge Instructions (Signed)
F/u with outpatient resources  ?

## 2023-01-25 NOTE — ED Provider Notes (Signed)
Behavioral Health Urgent Care Medical Screening Exam  Patient Name: Mark Hammond MRN: [id number] Date of Evaluation: 01/25/23 Chief Complaint:   Diagnosis:  Final diagnoses:  Homelessness  Alcohol abuse  Anxious appearance  Malingering    History of Present illness: Mark Hammond is a 32 y.o. male. With a history of schizophrenia, depression, bipolar disorder and polysubstance abuse.  Presented to [facility] via [employer] voluntarily.  Patient stated he had a friend called the police to bring him here.  Review of patient records show patient was discharge 2 days ago from [facility] after he was admitted on 01/13/23.  Per the patient he was out drinking tonight patient stated he has nowhere to go he is homeless and he is looking for [place] to help him take care of himself. Per the patient I need help because I can't  take care of myself. Scientist, research (medical) discussed with patient the need for him to be able to take care of himself or try to get into the men shelter.  Face-to-face observation of patient, patient is alert and oriented x 4, speech is clear, maintained fair eye contact.  Patient denies SI, HI, AVH or paranoia.  Patient reports he was drinking prior to coming in.  Patient reports he is homeless.  Writer provide patient with outpatient resources upon discharge to [employer] and outpatient services.  Patient reports he does have a prescription that he was given 2 days ago but he has not picked up the medicine just yet. At this time patient does not appear to be influence by external or internal stimuli.    Recommend discharge the patient to follow-up with information that was given to him 2 days ago and also new examination given to him tonight  Flowsheet Row ED from 01/25/2023 in [facility] Most recent reading at 01/25/2023  4:08 AM [facility] ([facility] Most recent reading at 01/13/2023  5:50 PM ED from 01/13/2023 in [facility]  Emergency Department at [facility] Most recent reading at 01/13/2023  3:47 AM  C-SSRS RISK CATEGORY No Risk Moderate Risk High Risk       Psychiatric Specialty Exam  Presentation  General Appearance:Casual  Eye Contact:Fair  Speech:Clear and Coherent  Speech Volume:Normal  Handedness:Right   Mood and Affect  Mood: Euthymic  Affect: Congruent   Thought Process  Thought Processes: Coherent  Descriptions of Associations:Circumstantial  Orientation:Full (Time, Place and Person)  Thought Content:Logical  Diagnosis of Schizophrenia or Schizoaffective disorder in past: Yes  Duration of Psychotic Symptoms: Greater than six months  Hallucinations:None mumbling  Ideas of Reference:None  Suicidal Thoughts:No Without Plan  Homicidal Thoughts:No Without Intent; Without Plan Air traffic controller for safety)   Sensorium  Memory: Immediate Fair  Judgment: Fair  Insight: Poor   Executive Functions  Concentration: Fair  Attention Span: Fair  Recall: [employer] of Knowledge: Fair  Language: Fair   Psychomotor Activity  Psychomotor Activity: Normal   Assets  Assets: Desire for Improvement   Sleep  Sleep: Fair  Number of hours: No data recorded  Physical Exam: Physical Exam HENT:     Head: Normocephalic.     Nose: Nose normal.  Cardiovascular:     Rate and Rhythm: Tachycardia present.  Pulmonary:     Effort: Pulmonary effort is normal.  Musculoskeletal:        General: Normal range of motion.     Cervical back: Normal range of motion.  Neurological:  General: No focal deficit present.     Mental Status: He is alert.  Psychiatric:        Mood and Affect: Mood normal.        Behavior: Behavior normal.    Review of Systems  Constitutional: Negative.   HENT: Negative.    Eyes: Negative.   Respiratory: Negative.    Cardiovascular: Negative.   Gastrointestinal: Negative.   Genitourinary: Negative.   Musculoskeletal:  Negative.   Skin: Negative.   Neurological: Negative.   Psychiatric/Behavioral:  Positive for substance abuse. The patient is nervous/anxious.    Blood pressure (!) 146/108, pulse (!) 104, temperature 98.3 F (36.8 C), temperature source Oral, resp. rate 20, SpO2 99 %. There is no height or weight on file to calculate BMI.  Musculoskeletal: Strength & Muscle Tone: within normal limits Gait & Station: normal Patient leans: N/A   [facility] MSE Discharge Disposition for Follow up and Recommendations: Based on my evaluation the patient does not appear to have an emergency medical condition and can be discharged with resources and follow up care in outpatient services for Medication Management and Individual Therapy   Sindy Guadeloupe, NP 01/25/2023, 5:44 AM

## 2023-01-25 NOTE — Progress Notes (Signed)
   01/25/23 0351  [facility] Triage Screening (Walk-ins at [facility] only)  How Did You Hear About [place]? Self  What Is the Reason for Your Visit/Call Today? Pt is a 32 yo male who presents to [facility] voluntarily requesting help "because he is unable to care for himself." Pt reports he was just released from [facility] two days ago and he is unable to care for himself. Pt denies SI, HI, and AVH. Pt reports drinking ETOH earlier tonight but is unable to recall how much he had to drink. Pt reports is currently no on any medication or currently seeing a therapist.  How Long Has This Been Causing You Problems? <Week  Have You Recently Had Any Thoughts About Hurting Yourself? No  Are You Planning to Commit Suicide/Harm Yourself At This time? No  Have you Recently Had Thoughts About Hurting Someone [name]? No  Are You Planning To Harm Someone At This Time? No  Explanation: n/a  Are you currently experiencing any auditory, visual or other hallucinations? No  Have You Used Any Alcohol or Drugs in the Past 24 Hours? Yes  How long ago did you use Drugs or Alcohol? Pt reports drinking earlier tonight.  What Did You Use and How Much? Pt was unable to recall how much he had to drink.  Do you have any current medical co-morbidities that require immediate attention? No  Clinician description of patient physical appearance/behavior: Pt was casually dressed and adequately groomed. Pt was cooperative. Pt's speech, movement and thought content were within normal limits. Pt's mood was anxious but with a flat affect. Pt was oriented x 4.  What Do You Feel Would Help You the Most Today? Treatment for Depression or other mood problem;Financial Resources;Housing Assistance;Medication(s)  If access to [facility] was not available, would you have sought care in the Emergency Department? Yes  Determination of Need Routine (7 days)  Options For Referral Outpatient Therapy    Flowsheet Row ED from 01/25/2023 in [facility] Most recent reading at 01/25/2023  4:08 AM Admission (Discharged) [facility] Most recent reading at 01/13/2023  5:50 PM ED from 01/13/2023 in [facility] Emergency Department at [facility] Most recent reading at 01/13/2023  3:47 AM  C-SSRS RISK CATEGORY No Risk Moderate Risk High Risk

## 2023-02-23 ENCOUNTER — Inpatient Hospital Stay (HOSPITAL_COMMUNITY)
Admission: AD | Admit: 2023-02-23 | Discharge: 2023-03-01 | DRG: 897 | Disposition: A | Discharge status: Home / Self Care | Payer: 59 | Source: Intra-hospital | Attending: Psychiatry | Admitting: Psychiatry

## 2023-02-23 ENCOUNTER — Emergency Department (HOSPITAL_COMMUNITY): Payer: 59

## 2023-02-23 ENCOUNTER — Other Ambulatory Visit: Payer: Self-pay

## 2023-02-23 ENCOUNTER — Encounter (HOSPITAL_COMMUNITY): Payer: Self-pay | Admitting: Psychiatry

## 2023-02-23 ENCOUNTER — Encounter (HOSPITAL_COMMUNITY): Payer: Self-pay

## 2023-02-23 ENCOUNTER — Emergency Department (HOSPITAL_COMMUNITY)
Admission: EM | Admit: 2023-02-23 | Discharge: 2023-02-23 | Disposition: A | Discharge status: Other Acute Inpatient Hospital | Payer: 59 | Attending: Emergency Medicine | Admitting: Emergency Medicine

## 2023-02-23 DIAGNOSIS — R4182 Altered mental status, unspecified: Secondary | ICD-10-CM | POA: Diagnosis not present

## 2023-02-23 DIAGNOSIS — S41102A Unspecified open wound of left upper arm, initial encounter: Secondary | ICD-10-CM | POA: Insufficient documentation

## 2023-02-23 DIAGNOSIS — Z5941 Food insecurity: Secondary | ICD-10-CM

## 2023-02-23 DIAGNOSIS — Y905 Blood alcohol level of 100-119 mg/100 ml: Secondary | ICD-10-CM | POA: Diagnosis not present

## 2023-02-23 DIAGNOSIS — F1994 Other psychoactive substance use, unspecified with psychoactive substance-induced mood disorder: Secondary | ICD-10-CM | POA: Diagnosis not present

## 2023-02-23 DIAGNOSIS — F1920 Other psychoactive substance dependence, uncomplicated: Secondary | ICD-10-CM | POA: Diagnosis present

## 2023-02-23 DIAGNOSIS — X780XXA Intentional self-harm by sharp glass, initial encounter: Secondary | ICD-10-CM | POA: Diagnosis not present

## 2023-02-23 DIAGNOSIS — B1920 Unspecified viral hepatitis C without hepatic coma: Secondary | ICD-10-CM | POA: Insufficient documentation

## 2023-02-23 DIAGNOSIS — F15159 Other stimulant abuse with stimulant-induced psychotic disorder, unspecified: Principal | ICD-10-CM | POA: Diagnosis present

## 2023-02-23 DIAGNOSIS — R9431 Abnormal electrocardiogram [ECG] [EKG]: Secondary | ICD-10-CM | POA: Diagnosis not present

## 2023-02-23 DIAGNOSIS — Z79899 Other long term (current) drug therapy: Secondary | ICD-10-CM | POA: Diagnosis not present

## 2023-02-23 DIAGNOSIS — R45851 Suicidal ideations: Secondary | ICD-10-CM | POA: Diagnosis not present

## 2023-02-23 DIAGNOSIS — F17210 Nicotine dependence, cigarettes, uncomplicated: Secondary | ICD-10-CM | POA: Diagnosis present

## 2023-02-23 DIAGNOSIS — S4992XA Unspecified injury of left shoulder and upper arm, initial encounter: Secondary | ICD-10-CM | POA: Diagnosis not present

## 2023-02-23 DIAGNOSIS — R442 Other hallucinations: Secondary | ICD-10-CM | POA: Diagnosis not present

## 2023-02-23 DIAGNOSIS — R45850 Homicidal ideations: Secondary | ICD-10-CM | POA: Diagnosis present

## 2023-02-23 DIAGNOSIS — S0990XA Unspecified injury of head, initial encounter: Secondary | ICD-10-CM | POA: Diagnosis not present

## 2023-02-23 DIAGNOSIS — R509 Fever, unspecified: Secondary | ICD-10-CM | POA: Diagnosis not present

## 2023-02-23 DIAGNOSIS — Z5900 Homelessness unspecified: Secondary | ICD-10-CM | POA: Diagnosis not present

## 2023-02-23 DIAGNOSIS — F1924 Other psychoactive substance dependence with psychoactive substance-induced mood disorder: Secondary | ICD-10-CM | POA: Insufficient documentation

## 2023-02-23 DIAGNOSIS — Z5982 Transportation insecurity: Secondary | ICD-10-CM

## 2023-02-23 DIAGNOSIS — F1120 Opioid dependence, uncomplicated: Secondary | ICD-10-CM | POA: Diagnosis present

## 2023-02-23 DIAGNOSIS — F29 Unspecified psychosis not due to a substance or known physiological condition: Secondary | ICD-10-CM | POA: Diagnosis not present

## 2023-02-23 LAB — CBC WITH DIFFERENTIAL/PLATELET
Abs Immature Granulocytes: 0.03 10*3/uL (ref 0.00–0.07)
Basophils Absolute: 0.1 10*3/uL (ref 0.0–0.1)
Basophils Relative: 1 %
Eosinophils Absolute: 0.1 10*3/uL (ref 0.0–0.5)
Eosinophils Relative: 1 %
HCT: 40.5 % (ref 39.0–52.0)
Hemoglobin: 13.9 g/dL (ref 13.0–17.0)
Immature Granulocytes: 0 %
Lymphocytes Relative: 31 %
Lymphs Abs: 2.9 10*3/uL (ref 0.7–4.0)
MCH: 33.2 pg (ref 26.0–34.0)
MCHC: 34.3 g/dL (ref 30.0–36.0)
MCV: 96.7 fL (ref 80.0–100.0)
Monocytes Absolute: 0.5 10*3/uL (ref 0.1–1.0)
Monocytes Relative: 6 %
Neutro Abs: 5.6 10*3/uL (ref 1.7–7.7)
Neutrophils Relative %: 61 %
Platelets: 227 10*3/uL (ref 150–400)
RBC: 4.19 MIL/uL — ABNORMAL LOW (ref 4.22–5.81)
RDW: 14.3 % (ref 11.5–15.5)
WBC: 9.3 10*3/uL (ref 4.0–10.5)
nRBC: 0 % (ref 0.0–0.2)

## 2023-02-23 LAB — RAPID URINE DRUG SCREEN, HOSP PERFORMED
Amphetamines: NOT DETECTED
Barbiturates: NOT DETECTED
Benzodiazepines: NOT DETECTED
Cocaine: POSITIVE — AB
Opiates: NOT DETECTED
Tetrahydrocannabinol: POSITIVE — AB

## 2023-02-23 LAB — SALICYLATE LEVEL: Salicylate Lvl: <7 mg/dL — ABNORMAL LOW (ref 7.0–30.0)

## 2023-02-23 LAB — ETHANOL: Alcohol, Ethyl (B): 107 mg/dL — ABNORMAL HIGH (ref <10)

## 2023-02-23 LAB — COMPREHENSIVE METABOLIC PANEL
ALT: 138 U/L — ABNORMAL HIGH (ref 0–44)
AST: 168 U/L — ABNORMAL HIGH (ref 15–41)
Albumin: 3.3 g/dL — ABNORMAL LOW (ref 3.5–5.0)
Alkaline Phosphatase: 127 U/L — ABNORMAL HIGH (ref 38–126)
Anion gap: 11 (ref 5–15)
BUN: 9 mg/dL (ref 6–20)
CO2: 24 mmol/L (ref 22–32)
Calcium: 8.2 mg/dL — ABNORMAL LOW (ref 8.9–10.3)
Chloride: 103 mmol/L (ref 98–111)
Creatinine, Ser: 0.75 mg/dL (ref 0.61–1.24)
GFR, Estimated: >60 mL/min (ref >60)
Glucose, Bld: 115 mg/dL — ABNORMAL HIGH (ref 70–99)
Potassium: 3.1 mmol/L — ABNORMAL LOW (ref 3.5–5.1)
Sodium: 138 mmol/L (ref 135–145)
Total Bilirubin: 0.6 mg/dL (ref 0.3–1.2)
Total Protein: 7.6 g/dL (ref 6.5–8.1)

## 2023-02-23 LAB — ACETAMINOPHEN LEVEL: Acetaminophen (Tylenol), Serum: <10 ug/mL — ABNORMAL LOW (ref 10–30)

## 2023-02-23 MED ORDER — NICOTINE 21 MG/24HR TD PT24
21.0000 mg | MEDICATED_PATCH | Freq: Every day | TRANSDERMAL | Status: DC
  Administered 2023-02-24: 21 mg via TRANSDERMAL
  Filled 2023-02-23 (×7): qty 1

## 2023-02-23 MED ORDER — TRAZODONE HCL 50 MG PO TABS
50.0000 mg | ORAL_TABLET | Freq: Every evening | ORAL | Status: DC | PRN
  Administered 2023-02-23 – 2023-02-28 (×6): 50 mg via ORAL
  Filled 2023-02-23 (×6): qty 1

## 2023-02-23 MED ORDER — LORAZEPAM 1 MG PO TABS
1.0000 mg | ORAL_TABLET | ORAL | Status: DC | PRN
  Filled 2023-02-23: qty 1

## 2023-02-23 MED ORDER — ZIPRASIDONE MESYLATE 20 MG IM SOLR: 20.0000 mg | INTRAMUSCULAR | Status: DC | PRN

## 2023-02-23 MED ORDER — HYDROXYZINE HCL 25 MG PO TABS: 25.0000 mg | ORAL_TABLET | Freq: Three times a day (TID) | ORAL | Status: DC | PRN

## 2023-02-23 MED ORDER — LORAZEPAM 2 MG/ML IJ SOLN: 2.0000 mg | Freq: Three times a day (TID) | INTRAMUSCULAR | Status: DC | PRN

## 2023-02-23 MED ORDER — ALUM & MAG HYDROXIDE-SIMETH 200-200-20 MG/5ML PO SUSP: 30.0000 mL | ORAL | Status: DC | PRN

## 2023-02-23 MED ORDER — RISPERIDONE 0.5 MG PO TBDP: 2.0000 mg | ORAL_TABLET | Freq: Three times a day (TID) | ORAL | Status: DC | PRN

## 2023-02-23 MED ORDER — DIPHENHYDRAMINE HCL 25 MG PO CAPS: 50.0000 mg | ORAL_CAPSULE | Freq: Three times a day (TID) | ORAL | Status: DC | PRN

## 2023-02-23 MED ORDER — POTASSIUM CHLORIDE CRYS ER 20 MEQ PO TBCR
40.0000 meq | EXTENDED_RELEASE_TABLET | Freq: Once | ORAL | Status: AC
  Administered 2023-02-23: 40 meq via ORAL
  Filled 2023-02-23: qty 2

## 2023-02-23 MED ORDER — OLANZAPINE 5 MG PO TABS: 5.0000 mg | ORAL_TABLET | Freq: Every day | ORAL | Status: DC

## 2023-02-23 MED ORDER — MAGNESIUM HYDROXIDE 400 MG/5ML PO SUSP: 30.0000 mL | Freq: Every day | ORAL | Status: DC | PRN

## 2023-02-23 MED ORDER — ACETAMINOPHEN 325 MG PO TABS: 650.0000 mg | ORAL_TABLET | Freq: Four times a day (QID) | ORAL | Status: DC | PRN

## 2023-02-23 MED ORDER — HYDROXYZINE HCL 25 MG PO TABS
25.0000 mg | ORAL_TABLET | Freq: Three times a day (TID) | ORAL | Status: DC | PRN
  Administered 2023-02-23 – 2023-02-28 (×7): 25 mg via ORAL
  Filled 2023-02-23 (×7): qty 1

## 2023-02-23 MED ORDER — OLANZAPINE 5 MG PO TABS
5.0000 mg | ORAL_TABLET | Freq: Every day | ORAL | Status: DC
  Administered 2023-02-23 – 2023-02-28 (×6): 5 mg via ORAL
  Filled 2023-02-23 (×10): qty 1

## 2023-02-23 MED ORDER — DIPHENHYDRAMINE HCL 50 MG/ML IJ SOLN: 50.0000 mg | Freq: Three times a day (TID) | INTRAMUSCULAR | Status: DC | PRN

## 2023-02-23 MED ORDER — HALOPERIDOL 5 MG PO TABS: 5.0000 mg | ORAL_TABLET | Freq: Three times a day (TID) | ORAL | Status: DC | PRN

## 2023-02-23 MED ORDER — HALOPERIDOL LACTATE 5 MG/ML IJ SOLN: 5.0000 mg | Freq: Three times a day (TID) | INTRAMUSCULAR | Status: DC | PRN

## 2023-02-23 MED ORDER — LORAZEPAM 1 MG PO TABS: 2.0000 mg | ORAL_TABLET | Freq: Three times a day (TID) | ORAL | Status: DC | PRN

## 2023-02-23 NOTE — Progress Notes (Signed)
Pt was accepted to [facility] TODAY 02/23/2023. Bed assignment: 503-2  Pt meets inpatient criteria per Alona Bene, PMHNP  Attending Physician will be Phineas Inches, MD  Report can be called to: - Adult unit: [phone]  Pt can arrive after 1 PM  Care Team Notified: [facility] [place], RN, Lum Babe, RN, and [place], PMHNP  [place], [place]  02/23/2023 12:05 PM
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
## 2023-02-23 NOTE — Consult Note (Signed)
[facility] ED ASSESSMENT   Reason for Consult:  Psych Consult Referring Physician:  Dr. Madilyn Hook  Patient Identification: Pedram Castor MRN:  [id number] ED Chief Complaint: <principal problem not specified>  Diagnosis:  Active Problems:   Substance induced mood disorder (HCC)   Polysubstance (including opioids) dependence, daily use [facility])   ED Assessment Time Calculation: Start Time: 0930 Stop Time: 1000 Total Time in Minutes (Assessment Completion): 30   Per Triage Note: BIBA from [facility] for attempted suicide by cutting left wrist with glass bottle, has auditory hallucinations, smoked crack and threw away crack pipe in sharps container and 2 cigarettes from [facility] for attempted suicide by cutting left wrist with glass bottle, has auditory hallucinations, smoked crack and threw away crack pipe in sharps container and 2 cigarettes   [name], 32 y.o., male patient seen face to face by this provider, consulted with Dr. Lucianne Muss; and chart reviewed on 02/23/23.  On evaluation Labib Bujnowski reports that he is here due to suicidal thoughts and cutting his left wrist, per EDP no evidence of long bone fracture or secondary skin infection. Patient currently denies SI/HI/AVH, states his appetite and sleep are poor, due to living on streets and drug usage.  Patient endorses using crack/cocaine/methamphetamine, pills, THC, EtOH, and occasionally fentanyl.  Patient states that he used the illicit substances all but has no yesterday.  Patient states that he has been using these substances for a long time.  Patient has a previous history of inpatient admission to the behavioral health Hospital on January 13, 2023,, states that he was started on medications but unsure what they were the only medication that he does remember taking was trazodone for sleep, states that he does not take any of his medications at all at this time, never went to pick up his prescription after he was discharged from behavioral health. Patient UDS  positive for cocaine, and THC. Patient BAL 107. Patient states he has no support system.    During evaluation Nicole Mcgovern is laying in bed in no acute distress.  He is alert, oriented x 3, calm, cooperative and attentive. His mood is euthymic with congruent/ flat affect. He has normal speech, and behavior. Patient appeared to be having some auditory hallucinations, while provider was in the room, he bean talking with himself and answering questions, he hollered " well speak up then." Speaking to himself.  Patient denies psychosis, and paranoia.  Per chart review patient last admission was to behavioral health Hospital from October 28, 2019 until October 30, 2019.  Patient has a long history of polysubstance abuse, and alcohol. Will recommend inpatient admission.   Past Psychiatric History: Schizoaffective disorder, bipolar type (HCC)   Risk to Self or Others: Risk to Self: Passive SI  Risk to Others:  No  Prior Inpatient Therapy:  Yes  Prior Outpatient Therapy:  No   [place] Scale:  Flowsheet Row ED from 02/23/2023 in [facility] Emergency Department at [facility] ED from 01/25/2023 in [facility] Ad[facility] (Discharged[facility]  C-SSRS RISK CATEGORY High Risk No Risk Moderate Risk       AIMS:  , , ,  ,   ASAM:    Substance Abuse:     Past Medical History:  Past Medical History:  Diagnosis Date   Alcoholic gastritis    Bipolar 1 disorder (HCC)    Dental abscess    Depression    Drug abuse (HCC)  ETOH abuse    Hallucination    Schizo affective schizophrenia (HCC)    History reviewed. No pertinent surgical history. Family History:  Family History  Problem Relation Age of Onset   Stroke Other    Social History:  Social History   Substance and Sexual Activity  Alcohol Use Yes   Comment: 4x 40oz beers daily     Social History   Substance and Sexual Activity  Drug Use Yes    Frequency: 7.0 times per week   Types: "Crack" cocaine, Heroin, MDMA (Ecstacy), Marijuana, Cocaine   Comment: occasional stimulant use    Social History   Socioeconomic History   Marital status: Single    Spouse name: Not on file   Number of children: Not on file   Years of education: Not on file   Highest education level: Not on file  Occupational History   Not on file  Tobacco Use   Smoking status: Every Day    Packs/day: 1    Types: Cigarettes   Smokeless tobacco: Never  Vaping Use   Vaping Use: Every day   Substances: THC  Substance and Sexual Activity   Alcohol use: Yes    Comment: 4x 40oz beers daily   Drug use: Yes    Frequency: 7.0 times per week    Types: "Crack" cocaine, Heroin, MDMA (Ecstacy), Marijuana, Cocaine    Comment: occasional stimulant use   Sexual activity: Yes  Other Topics Concern   Not on file  Social History Narrative   Pt lives in [place] with ex-girlfriend.  Pt is not followed by an outpatient psychiatric provider.   Social Determinants of Health   Financial Resource Strain: Not on file  Food Insecurity: Food Insecurity Present (01/13/2023)   Hunger Vital Sign    Worried About Running Out of Food in the Last Year: Often true    Ran Out of Food in the Last Year: Sometimes true  Transportation Needs: Unmet Transportation Needs (01/13/2023)   PRAPARE - Administrator, Civil Service (Medical): Yes    Lack of Transportation (Non-Medical): Yes  Physical Activity: Not on file  Stress: Not on file  Social Connections: Not on file   Additional Social History:    Allergies:   Allergies  Allergen Reactions   Codeine Other (See Comments)    Patient states parents told him he was allergic at a young age.    Labs:  Results for orders placed or performed during the hospital encounter of 02/23/23 (from the past 48 hour(s))  Acetaminophen level     Status: Abnormal   Collection Time: 02/23/23  3:00 AM  Result Value Ref Range    Acetaminophen (Tylenol), Serum <10 (L) 10 - 30 ug/mL    Comment: (NOTE) Therapeutic concentrations vary significantly. A range of 10-30 ug/mL  may be an effective concentration for many patients. However, some  are best treated at concentrations outside of this range. Acetaminophen concentrations >150 ug/mL at 4 hours after ingestion  and >50 ug/mL at 12 hours after ingestion are often associated with  toxic reactions.  Performed at [facility], [address], [place], [place] [address]   Comprehensive metabolic panel     Status: Abnormal   Collection Time: 02/23/23  3:00 AM  Result Value Ref Range   Sodium 138 135 - 145 mmol/L   Potassium 3.1 (L) 3.5 - 5.1 mmol/L   Chloride 103 98 - 111 mmol/L   CO2 24 22 - 32 mmol/L  Glucose, Bld 115 (H) 70 - 99 mg/dL    Comment: Glucose reference range applies only to samples taken after fasting for at least 8 hours.   BUN 9 6 - 20 mg/dL   Creatinine, Ser 9.62 0.61 - 1.24 mg/dL   Calcium 8.2 (L) 8.9 - 10.3 mg/dL   Total Protein 7.6 6.5 - 8.1 g/dL   Albumin 3.3 (L) 3.5 - 5.0 g/dL   AST 952 (H) 15 - 41 U/L   ALT 138 (H) 0 - 44 U/L   Alkaline Phosphatase 127 (H) 38 - 126 U/L   Total Bilirubin 0.6 0.3 - 1.2 mg/dL   GFR, Estimated >84 >13 mL/min    Comment: (NOTE) Calculated using the CKD-EPI Creatinine Equation (2021)    Anion gap 11 5 - 15    Comment: Performed at [facility], [address] [place], [place] [address]  Ethanol     Status: Abnormal   Collection Time: 02/23/23  3:00 AM  Result Value Ref Range   Alcohol, Ethyl (B) 107 (H) <10 mg/dL    Comment: (NOTE) Lowest detectable limit for serum alcohol is 10 mg/dL.  For medical purposes only. Performed at [facility], [address], [place], [place] [address]   CBC with Differential     Status: Abnormal   Collection Time: 02/23/23  3:00 AM  Result Value Ref Range   WBC 9.3 4.0 - 10.5 K/uL   RBC 4.19 (L) 4.22 - 5.81  MIL/uL   Hemoglobin 13.9 13.0 - 17.0 g/dL   HCT 36.6 44.0 - 34.7 %   MCV 96.7 80.0 - 100.0 fL   MCH 33.2 26.0 - 34.0 pg   MCHC 34.3 30.0 - 36.0 g/dL   RDW 42.5 95.6 - 38.7 %   Platelets 227 150 - 400 K/uL   nRBC 0.0 0.0 - 0.2 %   Neutrophils Relative % 61 %   Neutro Abs 5.6 1.7 - 7.7 K/uL   Lymphocytes Relative 31 %   Lymphs Abs 2.9 0.7 - 4.0 K/uL   Monocytes Relative 6 %   Monocytes Absolute 0.5 0.1 - 1.0 K/uL   Eosinophils Relative 1 %   Eosinophils Absolute 0.1 0.0 - 0.5 K/uL   Basophils Relative 1 %   Basophils Absolute 0.1 0.0 - 0.1 K/uL   Immature Granulocytes 0 %   Abs Immature Granulocytes 0.03 0.00 - 0.07 K/uL    Comment: Performed at [facility], [address] [place], [place] [address]  Salicylate level     Status: Abnormal   Collection Time: 02/23/23  3:00 AM  Result Value Ref Range   Salicylate Lvl <7.0 (L) 7.0 - 30.0 mg/dL    Comment: Performed at [facility], [address] [place], [place] [address]  Urine rapid drug screen (hosp performed)     Status: Abnormal   Collection Time: 02/23/23  3:00 AM  Result Value Ref Range   Opiates NONE DETECTED NONE DETECTED   Cocaine POSITIVE (A) NONE DETECTED   Benzodiazepines NONE DETECTED NONE DETECTED   Amphetamines NONE DETECTED NONE DETECTED   Tetrahydrocannabinol POSITIVE (A) NONE DETECTED   Barbiturates NONE DETECTED NONE DETECTED    Comment: (NOTE) DRUG SCREEN FOR MEDICAL PURPOSES ONLY.  IF CONFIRMATION IS NEEDED FOR ANY PURPOSE, NOTIFY LAB WITHIN 5 DAYS.  LOWEST DETECTABLE LIMITS FOR URINE DRUG SCREEN Drug Class  Cutoff (ng/mL) Amphetamine and metabolites    1000 Barbiturate and metabolites    200 Benzodiazepine                 200 Opiates and metabolites        300 Cocaine and metabolites        300 THC                            50 Performed at [facility], [address] [place], [place] [address]     Current  Facility-Administered Medications  Medication Dose Route Frequency Provider Last Rate Last Admin   risperiDONE (RISPERDAL M-TABS) disintegrating tablet 2 mg  2 mg Oral Q8H PRN Tilden Fossa, MD       And   LORazepam (ATIVAN) tablet 1 mg  1 mg Oral PRN Tilden Fossa, MD       And   ziprasidone (GEODON) injection 20 mg  20 mg Intramuscular PRN Tilden Fossa, MD       Current Outpatient Medications  Medication Sig Dispense Refill   benztropine (COGENTIN) 0.5 MG tablet Take 1 tablet (0.5 mg total) by mouth 2 (two) times daily. (Patient not taking: Reported on 02/23/2023) 60 tablet 0   cholecalciferol (CHOLECALCIFEROL) 25 MCG tablet Take 1 tablet (1,000 Units total) by mouth daily. (Patient not taking: Reported on 02/23/2023) 30 tablet 0   haloperidol (HALDOL) 10 MG tablet Take 1 tablet (10 mg total) by mouth 2 (two) times daily. (Patient not taking: Reported on 02/23/2023) 60 tablet 0   hydrOXYzine (ATARAX) 25 MG tablet Take 1 tablet (25 mg total) by mouth 3 (three) times daily as needed for anxiety. (Patient not taking: Reported on 02/23/2023) 30 tablet 0   OLANZapine zydis (ZYPREXA) 10 MG disintegrating tablet Take 1 tablet (10 mg total) by mouth 2 (two) times daily. (Patient not taking: Reported on 02/23/2023) 60 tablet 0   traZODone (DESYREL) 150 MG tablet Take 1 tablet (150 mg total) by mouth at bedtime. (Patient not taking: Reported on 02/23/2023) 30 tablet 0    Musculoskeletal: Strength & Muscle Tone: within normal limits Gait & Station: normal Patient leans: N/A   Psychiatric Specialty Exam: Presentation  General Appearance:  Disheveled  Eye Contact: Good  Speech: Clear and Coherent  Speech Volume: Normal  Handedness: Right   Mood and Affect  Mood: Anxious  Affect: Depressed; Flat   Thought Process  Thought Processes: Coherent  Descriptions of Associations:Intact  Orientation:Full (Time, Place and Person)  Thought Content:Logical  History of  Schizophrenia/Schizoaffective disorder:Yes  Duration of Psychotic Symptoms:Greater than six months  Hallucinations:Hallucinations: Auditory  Ideas of Reference:None  Suicidal Thoughts:Suicidal Thoughts: Yes, Active SI Active Intent and/or Plan: With Plan  Homicidal Thoughts:Homicidal Thoughts: No   Sensorium  Memory: Immediate Good; Recent Good  Judgment: Poor  Insight: Poor   Executive Functions  Concentration: Fair  Attention Span: Fair  Recall: Fair  Fund of Knowledge: Fair  Language: Fair   Psychomotor Activity  Psychomotor Activity: Psychomotor Activity: Normal   Assets  Assets: Manufacturing systems engineer; Social Support    Sleep  Sleep: Sleep: Poor   Physical Exam: Physical Exam Vitals and nursing note reviewed. Exam conducted with a chaperone present.  Musculoskeletal:        General: Normal range of motion.     Comments: Patient has a bandage on right arm, due to self harm, no broken bones or infection.   Neurological:  Mental Status: He is alert.  Psychiatric:        Attention and Perception: Attention normal.        Mood and Affect: Mood normal.        Speech: Speech normal.        Behavior: Behavior is cooperative.        Thought Content: Thought content includes suicidal ideation.        Cognition and Memory: Memory normal.        Judgment: Judgment is inappropriate.    Review of Systems  Psychiatric/Behavioral:  Positive for depression, substance abuse and suicidal ideas.    Blood pressure 134/85, pulse 91, temperature 98.5 F (36.9 C), temperature source Oral, resp. rate 20, height 5\' 4"  (1.626 m), weight 56.7 kg, SpO2 100 %. Body mass index is 21.46 kg/m.   Medical Decision Making: Patient is a 32 year old male with the above-stated past psychiatric history who is seen in follow-up.  Diagnosis: #1 schizoaffective disorder; bipolar type versus substance-induced psychotic disorder, #2 methamphetamine dependence, #3  cocaine dependence, #4 marijuana dependence Patient case review and discussed with Dr. Lucianne Muss. Patient needs inpatient psychiatric admission for stabilization and treatment.      Disposition: Recommend psychiatric Inpatient admission.  Patient may review at behavioral health Hospital, pending voluntary consent form.      Makiah Clauson MOTLEY-MANGRUM, PMHNP 02/23/2023 10:52 AM

## 2023-02-23 NOTE — Progress Notes (Signed)
Adult Psychoeducational Group Note  Date:  02/23/2023 Time:  8:54 PM  Group Topic/Focus:  Wrap-Up Group:   The focus of this group is to help patients review their daily goal of treatment and discuss progress on daily workbooks.  Participation Level:  Active  Participation Quality:  Attentive  Affect:  Appropriate  Cognitive:  Appropriate  Insight: Appropriate  Engagement in Group:  Engaged  Modes of Intervention:  Support  Additional Comments:  Pt attend group today. Pt stated that he is willing to work on communicating better with parents. Pt stated that he had a great day today. Pt took notes on how he can better in his parents household such as, eating food that is not appealing to him by changing it in his liking.    Mark Hammond 02/23/2023, 8:54 PM

## 2023-02-23 NOTE — ED Notes (Signed)
Patient discharged off to [facility].  Patient alert, calm, cooperative, no s/s of distress at this time. Patient discharge information and belongings given to patient. Patient ambulatory off unit, escorted by NT. Patient transported by [employer].

## 2023-02-23 NOTE — ED Triage Notes (Signed)
BIBA from [facility] for attempted suicide by cutting left wrist with glass bottle, has auditory hallucinations, smoked crack and threw away crack pipe in sharps container and 2 cigarettes,
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
## 2023-02-23 NOTE — Tx Team (Signed)
Initial Treatment Plan 02/23/2023 3:26 PM [name] BJY:[id number]    PATIENT STRESSORS: Financial difficulties   Substance abuse     PATIENT STRENGTHS: Communication skills  Motivation for treatment/growth    PATIENT IDENTIFIED PROBLEMS: Substance Abuse  S.I                   DISCHARGE CRITERIA:  Verbal commitment to aftercare and medication compliance  PRELIMINARY DISCHARGE PLAN: Placement in alternative living arrangements  PATIENT/FAMILY INVOLVEMENT: This treatment plan has been presented to and reviewed with the patient, Mark Hammond, and/or family member, .  The patient and family have been given the opportunity to ask questions and make suggestions.  Guadlupe Spanish, RN 02/23/2023, 3:26 PM

## 2023-02-23 NOTE — ED Notes (Signed)
Pt laying in bed in no obvious distress. Pt asked for something to drink  and it was provided. Pt is laying in bed resting at this time.

## 2023-02-23 NOTE — ED Notes (Addendum)
Pt was changed into purple scrubs and belongings were placed in the nurses station cabinet on the 19-22 side.

## 2023-02-23 NOTE — ED Notes (Signed)
Pt walked to safe transport at ED entrance for transport over to [facility].

## 2023-02-24 ENCOUNTER — Encounter (HOSPITAL_COMMUNITY): Payer: Self-pay

## 2023-02-24 ENCOUNTER — Encounter (HOSPITAL_COMMUNITY): Payer: Self-pay | Admitting: Psychiatry

## 2023-02-24 LAB — LIPID PANEL
Cholesterol: 147 mg/dL (ref 0–200)
HDL: 42 mg/dL (ref >40)
LDL Cholesterol: 86 mg/dL (ref 0–99)
Total CHOL/HDL Ratio: 3.5 RATIO
Triglycerides: 96 mg/dL (ref <150)
VLDL: 19 mg/dL (ref 0–40)

## 2023-02-24 MED ORDER — POTASSIUM CHLORIDE 20 MEQ PO PACK
40.0000 meq | PACK | Freq: Once | ORAL | Status: AC
  Administered 2023-02-24: 40 meq via ORAL
  Filled 2023-02-24 (×2): qty 2

## 2023-02-24 NOTE — BHH Suicide Risk Assessment (Signed)
Suicide Risk Assessment  Admission Assessment    [facility] Admission Suicide Risk Assessment   Nursing information obtained from:  Patient Demographic factors:  Male, Caucasian, Low socioeconomic status, Living alone Current Mental Status:  Suicidal ideation indicated by patient Loss Factors:  Financial problems / change in socioeconomic status Historical Factors:  Impulsivity, Prior suicide attempts Risk Reduction Factors:  NA  Total Time spent with patient: 45 min Principal Problem: Substance induced mood disorder (HCC) Diagnosis:  Principal Problem:   Substance induced mood disorder (HCC)   Subjective Data:  The patient is a 32 year old male with a past psychiatric history of schizoaffective disorder, diagnosis made at a recent stay at the behavioral hospital in April of this year.  At that time the patient presented with bizarre behavior, urine drug screen positive for methamphetamine and THC.  On the present occasion, the patient was brought to the emergency department by ambulance after cutting his arm at [facility].  The patient is admitted to behavioral health hospital on a voluntary basis.   On my interview today, the patient is irritable.  His thought process is linear and logical.  Limited information could be gathered, because most of the patient's responses are either sarcastic or angry retorts.  He is able to state that his goal for hospitalization is to "get back on medication and go to rehab".  LCSW notified of his desire to go to rehab.  The patient is unable to state what medications he is taking.  Discussed with him that he is taking Zyprexa currently that he is amenable to continuing the medication.  Patient refuses for me to contact anyone who knows him outside of the hospital.  He denies experiencing present suicidal thoughts and contracts for safety.  He denies having any major medical conditions.     As the conversation progressed, the patient became more irritable and would  not answer questions.  The patient interview was ended early because of futility.    Continued Clinical Symptoms:  Alcohol Use Disorder Identification Test Final Score (AUDIT): 26 The "Alcohol Use Disorders Identification Test", Guidelines for Use in Primary Care, Second Edition.  World Science writer [facility]). Score between 0-7:  no or low risk or alcohol related problems. Score between 8-15:  moderate risk of alcohol related problems. Score between 16-19:  high risk of alcohol related problems. Score 20 or above:  warrants further diagnostic evaluation for alcohol dependence and treatment.   CLINICAL FACTORS:  Substance-induced mood disorder  Psychiatric Specialty Exam: Physical Exam Constitutional:      Appearance: the patient is not toxic-appearing.  Pulmonary:     Effort: Pulmonary effort is normal.  Neurological:     General: No focal deficit present.     Mental Status: the patient is alert and oriented to person, place, and time.   Review of Systems  Respiratory:  Negative for shortness of breath.   Cardiovascular:  Negative for chest pain.  Gastrointestinal:  Negative for abdominal pain, constipation, diarrhea, nausea and vomiting.  Neurological:  Negative for headaches.      BP (!) 128/91   Pulse 83   Temp (!) 101.2 F (38.4 C) (Oral)   Resp 18   Ht 5\' 2"  (1.575 m)   Wt 54.9 kg   SpO2 97%   BMI 22.13 kg/m   General Appearance: Fairly Groomed  Eye Contact:  Good  Speech:  Clear and Coherent  Volume:  Normal  Mood: Euthymic  Affect: irritable  Thought Process:  Coherent  Orientation:  Full (Time, Place, and Person)  Thought Content: Logical   Suicidal Thoughts:  No  Homicidal Thoughts:  No  Memory:  Immediate;   Good  Judgement:  poor  Insight:  poor  Psychomotor Activity:  Normal  Concentration:  Concentration: Good  Recall:  Good  Fund of Knowledge: Good  Language: Good  Akathisia:  No  Handed:  not assessed  AIMS (if indicated): not done   Assets:  Communication Skills Desire for Improvement Financial Resources/Insurance Housing Leisure Time Physical Health  ADL's:  Intact  Cognition: WNL  Sleep:  Fair      COGNITIVE FEATURES THAT CONTRIBUTE TO RISK:  None    SUICIDE RISK:  Moderate: Frequent suicidal ideation with limited intensity, and duration, some specificity in terms of plans, no associated intent, good self-control, limited dysphoria/symptomatology, some risk factors present, and identifiable protective factors, including available and accessible social support.  PLAN OF CARE: See H and P  I certify that inpatient services furnished can reasonably be expected to improve the patient's condition.   Carlyn Reichert, MD PGY-2

## 2023-02-24 NOTE — Group Note (Signed)
Date:  02/24/2023 Time:  11:24 AM  Group Topic/Focus:  Goals Group:   The focus of this group is to help patients establish daily goals to achieve during treatment and discuss how the patient can incorporate goal setting into their daily lives to aide in recovery. Orientation:   The focus of this group is to educate the patient on the purpose and policies of crisis stabilization and provide a format to answer questions about their admission.  The group details unit policies and expectations of patients while admitted.    Participation Level:  Did Not Attend   Mark Hammond 02/24/2023, 11:24 AM

## 2023-02-24 NOTE — BH IP Treatment Plan (Signed)
Interdisciplinary Treatment and Diagnostic Plan   02/24/2023 Time of Session: 1030 Mark Hammond MRN: [id number]  Principal Diagnosis: Substance induced mood disorder (HCC)  Secondary Diagnoses: Principal Problem:   Substance induced mood disorder (HCC)   Current Medications:  Current Facility-Administered Medications  Medication Dose Route Frequency Provider Last Rate Last Admin   acetaminophen (TYLENOL) tablet 650 mg  650 mg Oral Q6H PRN Lenox Ponds, NP       alum & mag hydroxide-simeth (MAALOX/MYLANTA) 200-200-20 MG/5ML suspension 30 mL  30 mL Oral Q4H PRN Lenox Ponds, NP       diphenhydrAMINE (BENADRYL) capsule 50 mg  50 mg Oral TID PRN Lenox Ponds, NP       Or   diphenhydrAMINE (BENADRYL) injection 50 mg  50 mg Intramuscular TID PRN Lenox Ponds, NP       haloperidol (HALDOL) tablet 5 mg  5 mg Oral TID PRN Lenox Ponds, NP       Or   haloperidol lactate (HALDOL) injection 5 mg  5 mg Intramuscular TID PRN Lenox Ponds, NP       hydrOXYzine (ATARAX) tablet 25 mg  25 mg Oral TID PRN Lenox Ponds, NP   25 mg at 02/24/23 0837   LORazepam (ATIVAN) tablet 2 mg  2 mg Oral TID PRN Lenox Ponds, NP       Or   LORazepam (ATIVAN) injection 2 mg  2 mg Intramuscular TID PRN Lenox Ponds, NP       magnesium hydroxide (MILK OF MAGNESIA) suspension 30 mL  30 mL Oral Daily PRN Lenox Ponds, NP       nicotine (NICODERM CQ - dosed in mg/24 hours) patch 21 mg  21 mg Transdermal Q0600 Lenox Ponds, NP   21 mg at 02/24/23 0837   OLANZapine (ZYPREXA) tablet 5 mg  5 mg Oral QHS Lenox Ponds, NP   5 mg at 02/23/23 2100   traZODone (DESYREL) tablet 50 mg  50 mg Oral QHS PRN Lenox Ponds, NP   50 mg at 02/23/23 2100   PTA Medications: Medications Prior to Admission  Medication Sig Dispense Refill Last Dose   benztropine (COGENTIN) 0.5 MG tablet Take 1 tablet (0.5 mg total) by mouth 2 (two) times daily. (Patient not taking: Reported on 02/23/2023) 60  tablet 0    cholecalciferol (CHOLECALCIFEROL) 25 MCG tablet Take 1 tablet (1,000 Units total) by mouth daily. (Patient not taking: Reported on 02/23/2023) 30 tablet 0    haloperidol (HALDOL) 10 MG tablet Take 1 tablet (10 mg total) by mouth 2 (two) times daily. (Patient not taking: Reported on 02/23/2023) 60 tablet 0    hydrOXYzine (ATARAX) 25 MG tablet Take 1 tablet (25 mg total) by mouth 3 (three) times daily as needed for anxiety. (Patient not taking: Reported on 02/23/2023) 30 tablet 0    OLANZapine zydis (ZYPREXA) 10 MG disintegrating tablet Take 1 tablet (10 mg total) by mouth 2 (two) times daily. (Patient not taking: Reported on 02/23/2023) 60 tablet 0    traZODone (DESYREL) 150 MG tablet Take 1 tablet (150 mg total) by mouth at bedtime. (Patient not taking: Reported on 02/23/2023) 30 tablet 0     Patient Stressors: Financial difficulties   Substance abuse    Patient Strengths: Printmaker for treatment/growth   Treatment Modalities: Medication Management, Group therapy, Case management,  1 to 1 session with clinician, Psychoeducation, Recreational therapy.   Physician Treatment Plan for  Primary Diagnosis: Substance induced mood disorder (HCC) Long Term Goal(s):     Short Term Goals:    Medication Management: Evaluate patient's response, side effects, and tolerance of medication regimen.  Therapeutic Interventions: 1 to 1 sessions, Unit Group sessions and Medication administration.  Evaluation of Outcomes: Progressing  Physician Treatment Plan for Secondary Diagnosis: Principal Problem:   Substance induced mood disorder (HCC)  Long Term Goal(s):     Short Term Goals:       Medication Management: Evaluate patient's response, side effects, and tolerance of medication regimen.  Therapeutic Interventions: 1 to 1 sessions, Unit Group sessions and Medication administration.  Evaluation of Outcomes: Progressing   RN Treatment Plan for Primary Diagnosis:  Substance induced mood disorder (HCC) Long Term Goal(s): Knowledge of disease and therapeutic regimen to maintain health will improve  Short Term Goals: Ability to remain free from injury will improve, Ability to verbalize frustration and anger appropriately will improve, Ability to demonstrate self-control, Ability to participate in decision making will improve, Ability to verbalize feelings will improve, Ability to disclose and discuss suicidal ideas, Ability to identify and develop effective coping behaviors will improve, and Compliance with prescribed medications will improve  Medication Management: RN will administer medications as ordered by provider, will assess and evaluate patient's response and provide education to patient for prescribed medication. RN will report any adverse and/or side effects to prescribing provider.  Therapeutic Interventions: 1 on 1 counseling sessions, Psychoeducation, Medication administration, Evaluate responses to treatment, Monitor vital signs and CBGs as ordered, Perform/monitor CIWA, COWS, AIMS and Fall Risk screenings as ordered, Perform wound care treatments as ordered.  Evaluation of Outcomes: Progressing   LCSW Treatment Plan for Primary Diagnosis: Substance induced mood disorder (HCC) Long Term Goal(s): Safe transition to appropriate next level of care at discharge, Engage patient in therapeutic group addressing interpersonal concerns.  Short Term Goals: Engage patient in aftercare planning with referrals and resources, Increase social support, Increase ability to appropriately verbalize feelings, Increase emotional regulation, Facilitate acceptance of mental health diagnosis and concerns, Facilitate patient progression through stages of change regarding substance use diagnoses and concerns, Identify triggers associated with mental health/substance abuse issues, and Increase skills for wellness and recovery  Therapeutic Interventions: Assess for all  discharge needs, 1 to 1 time with Social worker, Explore available resources and support systems, Assess for adequacy in community support network, Educate family and significant other(s) on suicide prevention, Complete Psychosocial Assessment, Interpersonal group therapy.  Evaluation of Outcomes: Progressing   Progress in Treatment: Attending groups: Yes. Participating in groups: Yes. Taking medication as prescribed: Yes. Toleration medication: Yes. Family/Significant other contact made: No, will contact:  Family Member Patient understands diagnosis: Yes. Discussing patient identified problems/goals with staff: Yes. Medical problems stabilized or resolved: Yes. Denies suicidal/homicidal ideation: Yes. Issues/concerns per patient self-inventory: Yes. Other: N/A  New problem(s) identified: No, Describe:  None Reported  New Short Term/Long Term Goal(s):medication stabilization, elimination of SI thoughts, development of comprehensive mental wellness plan.      Patient Goals:  Medication Stabilization  Discharge Plan or Barriers: Patient recently admitted. CSW will continue to follow and assess for appropriate referrals and possible discharge planning.      Reason for Continuation of Hospitalization: Depression Medication stabilization Withdrawal symptoms  Estimated Length of Stay: 3-7 Days  Last 3 [place] Suicide Severity Risk Score: Flowsheet Row [facility] Most recent reading at 02/23/2023  3:00 PM ED from 02/23/2023 in [facility] Emergency  [facility] at [facility] Most recent reading at 02/23/2023  2:31 AM ED from 01/25/2023 in [facility] Most recent reading at 01/25/2023  4:08 AM  C-SSRS RISK CATEGORY High Risk High Risk No Risk       Last PHQ 2/9 Scores:     No data to display         detox, medication management for mood stabilization; elimination of SI  thoughts; development of comprehensive mental wellness/sobriety plan    Scribe for Treatment Team: Ane Payment, LCSW 02/24/2023 1:39 PM

## 2023-02-24 NOTE — BHH Group Notes (Signed)
Spirituality group facilitated by Chaplain Katy Bernestine Holsapple, BCC.   Group Description: Group focused on topic of hope. Patients participated in facilitated discussion around topic, connecting with one another around experiences and definitions for hope. Group members engaged with visual explorer photos, reflecting on what hope looks like for them today. Group engaged in discussion around how their definitions of hope are present today in hospital.   Modalities: Psycho-social ed, Adlerian, Narrative, MI   Patient Progress: Did not attend.  

## 2023-02-24 NOTE — H&P (Signed)
Psychiatric Admission Assessment Adult  Patient Identification: Mark Hammond MRN:  [id number] Date of Evaluation:  02/24/2023 Chief Complaint:  Substance induced mood disorder (HCC) [F19.94] Principal Diagnosis: Substance induced mood disorder (HCC) Diagnosis:  Principal Problem:   Substance induced mood disorder (HCC)  History of Present Illness: The patient is a 32 year old male with a past psychiatric history of schizoaffective disorder, diagnosis made at a recent stay at the behavioral hospital in April of this year.  At that time the patient presented with bizarre behavior, urine drug screen positive for methamphetamine and THC.  On the present occasion, the patient was brought to the emergency department by ambulance after cutting his arm at [facility].  The patient is admitted to behavioral health hospital on a voluntary basis.  On my interview today, the patient is irritable.  His thought process is linear and logical.  Limited information could be gathered, because most of the patient's responses are either sarcastic or angry retorts.  He is able to state that his goal for hospitalization is to "get back on medication and go to rehab".  LCSW notified of his desire to go to rehab.  The patient is unable to state what medications he is taking.  Discussed with him that he is taking Zyprexa currently that he is amenable to continuing the medication.  Patient refuses for me to contact anyone who knows him outside of the hospital.  He denies experiencing present suicidal thoughts and contracts for safety.  He denies having any major medical conditions.    As the conversation progressed, the patient became more irritable and would not answer questions.  The patient interview was ended early because of futility.   Total Time spent with patient: 20 min  Past Psychiatric History: as above  Is the patient at risk to self? yes Has the patient been a risk to self in the past 6 months? yes Has the  patient been a risk to self within the distant past? no Is the patient a risk to others? no Has the patient been a risk to others in the past 6 months? no Has the patient been a risk to others within the distant past? no  [place] Scale:  Flowsheet Row [facility]) [facility] Most recent reading at 02/23/2023  3:00 PM ED from 02/23/2023 in [facility] Emergency Department [facility] Most recent reading at 02/23/2023  2:31 AM ED from 01/25/2023 in [facility] Most recent reading at 01/25/2023  4:08 AM  C-SSRS RISK CATEGORY High Risk High Risk No Risk        Prior Inpatient Therapy: yes Prior Outpatient Therapy: yes  Alcohol Screening:  Patient refused Alcohol Screening Tool: Yes 1. How often do you have a drink containing alcohol?: 4 or more times a week 2. How many drinks containing alcohol do you have on a typical day when you are drinking?: 1 or 2 3. How often do you have six or more drinks on one occasion?: Weekly AUDIT-C Score: 7 4. How often during the last year have you found that you were not able to stop drinking once you had started?: Weekly 5. How often during the last year have you failed to do what was normally expected from you because of drinking?: Weekly 6. How often during the last year have you needed a first drink in the morning to get yourself going after a heavy drinking session?: Weekly 7. How often during the  last year have you had a feeling of guilt of remorse after drinking?: Daily or almost daily 8. How often during the last year have you been unable to remember what happened the night before because you had been drinking?: Monthly 9. Have you or someone else been injured as a result of your drinking?: No 10. Has a relative or friend or a doctor or another health worker been concerned about your drinking or suggested you cut down?: Yes, during the last year Alcohol  Use Disorder Identification Test Final Score (AUDIT): 26 Alcohol Brief Interventions/Follow-up: Alcohol education/Brief advice Substance Abuse History in the last 12 months:  yes Consequences of Substance Abuse: negative Previous Psychotropic Medications: yes Psychological Evaluations: yes Past Medical History:  Past Medical History:  Diagnosis Date   Alcoholic gastritis    Bipolar 1 disorder (HCC)    Dental abscess    Depression    Drug abuse (HCC)    ETOH abuse    Hallucination    Schizo affective schizophrenia (HCC)     History reviewed. No pertinent surgical history. Family History:  Family History  Problem Relation Age of Onset   Stroke Other    Family Psychiatric  History: none pertinent Tobacco Screening:  Social History   Tobacco Use  Smoking Status Every Day   Packs/day: 1   Types: Cigarettes  Smokeless Tobacco Never    BH Tobacco Counseling     Are you interested in Tobacco Cessation Medications?  N/A, patient does not use tobacco products Counseled patient on smoking cessation:  N/A, patient does not use tobacco products Reason Tobacco Screening Not Completed: Patient Refused Screening       Social History:  Social History   Substance and Sexual Activity  Alcohol Use Yes   Comment: 4-5x 40oz beers daily     Social History   Substance and Sexual Activity  Drug Use Yes   Frequency: 7.0 times per week   Types: "Crack" cocaine, Heroin, MDMA (Ecstacy), Marijuana, Cocaine   Comment: frequent stimulant use    Additional Social History: Marital status: Single Are you sexually active?: Yes What is your sexual orientation?: Straight Has your sexual activity been affected by drugs, alcohol, medication, or emotional stress?: Denies Does patient have children?: No                         Allergies:   Allergies  Allergen Reactions   Codeine Other (See Comments)    Patient states parents told him he was allergic at a young age.   Lab  Results:  Results for orders placed or performed during the hospital encounter of 02/23/23 (from the past 48 hour(s))  Lipid panel     Status: None   Collection Time: 02/24/23  6:27 AM  Result Value Ref Range   Cholesterol 147 0 - 200 mg/dL   Triglycerides 96 <161 mg/dL   HDL 42 >09 mg/dL   Total CHOL/HDL Ratio 3.5 RATIO   VLDL 19 0 - 40 mg/dL   LDL Cholesterol 86 0 - 99 mg/dL    Comment:        Total Cholesterol/HDL:CHD Risk Coronary Heart Disease Risk Table                     Men   Women  1/2 Average Risk   3.4   3.3  Average Risk       5.0   4.4  2 X Average Risk  9.6   7.1  3 X Average Risk  23.4   11.0        Use the calculated Patient Ratio above and the CHD Risk Table to determine the patient's CHD Risk.        ATP III CLASSIFICATION (LDL):  <100     mg/dL   Optimal  161-096  mg/dL   Near or Above                    Optimal  130-159  mg/dL   Borderline  045-409  mg/dL   High  >811     mg/dL   Very High Performed at [facility], [address] [place], [place] [address]     Blood Alcohol level:  Lab Results  Component Value Date   ETH 107 (H) 02/23/2023   ETH 163 (H) 01/13/2023    Metabolic Disorder Labs:  Lab Results  Component Value Date   HGBA1C 5.7 (H) 01/14/2023   MPG 116.89 01/14/2023   MPG 111.15 01/13/2023   Lab Results  Component Value Date   PROLACTIN 31.4 (H) 11/16/2019   Lab Results  Component Value Date   CHOL 147 02/24/2023   TRIG 96 02/24/2023   HDL 42 02/24/2023   CHOLHDL 3.5 02/24/2023   VLDL 19 02/24/2023   LDLCALC 86 02/24/2023   LDLCALC 57 01/14/2023    Current Medications: Current Facility-Administered Medications  Medication Dose Route Frequency Provider Last Rate Last Admin   acetaminophen (TYLENOL) tablet 650 mg  650 mg Oral Q6H PRN Lenox Ponds, NP       alum & mag hydroxide-simeth (MAALOX/MYLANTA) 200-200-20 MG/5ML suspension 30 mL  30 mL Oral Q4H PRN Lenox Ponds, NP        diphenhydrAMINE (BENADRYL) capsule 50 mg  50 mg Oral TID PRN Lenox Ponds, NP       Or   diphenhydrAMINE (BENADRYL) injection 50 mg  50 mg Intramuscular TID PRN Lenox Ponds, NP       haloperidol (HALDOL) tablet 5 mg  5 mg Oral TID PRN Lenox Ponds, NP       Or   haloperidol lactate (HALDOL) injection 5 mg  5 mg Intramuscular TID PRN Lenox Ponds, NP       hydrOXYzine (ATARAX) tablet 25 mg  25 mg Oral TID PRN Lenox Ponds, NP   25 mg at 02/24/23 0837   LORazepam (ATIVAN) tablet 2 mg  2 mg Oral TID PRN Lenox Ponds, NP       Or   LORazepam (ATIVAN) injection 2 mg  2 mg Intramuscular TID PRN Lenox Ponds, NP       magnesium hydroxide (MILK OF MAGNESIA) suspension 30 mL  30 mL Oral Daily PRN Lenox Ponds, NP       nicotine (NICODERM CQ - dosed in mg/24 hours) patch 21 mg  21 mg Transdermal Q0600 Lenox Ponds, NP   21 mg at 02/24/23 0837   OLANZapine (ZYPREXA) tablet 5 mg  5 mg Oral QHS Lenox Ponds, NP   5 mg at 02/23/23 2100   traZODone (DESYREL) tablet 50 mg  50 mg Oral QHS PRN Lenox Ponds, NP   50 mg at 02/23/23 2100   PTA Medications: Medications Prior to Admission  Medication Sig Dispense Refill Last Dose   benztropine (COGENTIN) 0.5 MG tablet Take 1 tablet (0.5 mg total) by mouth 2 (two) times daily. (Patient not  taking: Reported on 02/23/2023) 60 tablet 0    cholecalciferol (CHOLECALCIFEROL) 25 MCG tablet Take 1 tablet (1,000 Units total) by mouth daily. (Patient not taking: Reported on 02/23/2023) 30 tablet 0    haloperidol (HALDOL) 10 MG tablet Take 1 tablet (10 mg total) by mouth 2 (two) times daily. (Patient not taking: Reported on 02/23/2023) 60 tablet 0    hydrOXYzine (ATARAX) 25 MG tablet Take 1 tablet (25 mg total) by mouth 3 (three) times daily as needed for anxiety. (Patient not taking: Reported on 02/23/2023) 30 tablet 0    OLANZapine zydis (ZYPREXA) 10 MG disintegrating tablet Take 1 tablet (10 mg total) by mouth 2 (two) times daily.  (Patient not taking: Reported on 02/23/2023) 60 tablet 0    traZODone (DESYREL) 150 MG tablet Take 1 tablet (150 mg total) by mouth at bedtime. (Patient not taking: Reported on 02/23/2023) 30 tablet 0     Musculoskeletal: Strength & Muscle Tone: normal Gait & Station: normal Patient leans: NA   Psychiatric Specialty Exam: Physical Exam Constitutional:      Appearance: the patient is not toxic-appearing.  Pulmonary:     Effort: Pulmonary effort is normal.  Neurological:     General: No focal deficit present.     Mental Status: the patient is alert and oriented to person, place, and time.   Review of Systems  Respiratory:  Negative for shortness of breath.   Cardiovascular:  Negative for chest pain.  Gastrointestinal:  Negative for abdominal pain, constipation, diarrhea, nausea and vomiting.  Neurological:  Negative for headaches.      BP 127/78 (BP Location: Right Arm)   Pulse (!) 108   Temp 99.7 F (37.6 C) (Oral)   Resp 18   Ht 5\' 2"  (1.575 m)   Wt 54.9 kg   SpO2 98%   BMI 22.13 kg/m   General Appearance: Fairly Groomed  Eye Contact:  Good  Speech:  Clear and Coherent  Volume:  Normal  Mood:  Euthymic  Affect:  angry, irritable  Thought Process:  Coherent  Orientation:  Full (Time, Place, and Person)  Thought Content: Logical   Suicidal Thoughts:  No  Homicidal Thoughts:  No  Memory:  Immediate;   Good  Judgement:  poor  Insight:  poor  Psychomotor Activity:  Normal  Concentration:  Concentration: Good  Recall:  Good  Fund of Knowledge: Good  Language: Good  Akathisia:  No  Handed:  not assessed  AIMS (if indicated): not done  Assets:  Communication Skills Desire for Improvement Financial Resources/Insurance Housing Leisure Time Physical Health  ADL's:  Intact  Cognition: WNL  Sleep:  Fair    Treatment Plan Summary: Daily contact with patient to assess and evaluate symptoms and progress in treatment and Medication management  Physician Treatment  Plan for Primary Diagnosis: Substance induced mood disorder (HCC) Long Term Goal(s): Improvement in symptoms so as ready for discharge  Short Term Goals: Ability to identify changes in lifestyle to reduce recurrence of condition will improve  Physician Treatment Plan for Secondary Diagnosis: Principal Problem:   Substance induced mood disorder (HCC)   Long Term Goal(s): Improvement in symptoms so as ready for discharge  Short Term Goals: Ability to verbalize feelings will improve  ASSESSMENT:   Diagnoses / Active Problems: Substance-induced mood disorder   PLAN: Safety and Monitoring:  -- Voluntary admission to inpatient psychiatric unit for safety, stabilization and treatment  -- Daily contact with patient to assess and evaluate symptoms and progress in treatment  --  Patient's case to be discussed in multi-disciplinary team meeting  -- Observation Level : q15 minute checks  -- Vital signs:  q12 hours  -- Precautions: suicide, elopement, and assault  2. Psychiatric Diagnoses and Treatment:  Continue Zyprexa 5 mg nightly for mood stabilization and previous history of schizoaffective disorder - The patient is not currently psychotic, no need to increase antipsychotic medication    3. Medical Issues Being Addressed:   Labs review, notable for potassium of 3.1 and transaminitis, will check hep C and replace potassium and recheck  -CIWA protocol for 3 days because of elevated BAL on admission, patient unable to answer questions regarding withdrawal symptoms   Tobacco Use Disorder  -- Nicotine patch 21mg /24 hours ordered  -- Smoking cessation encouraged  4. Discharge Planning:   -- Social work and case management to assist with discharge planning and identification of hospital follow-up needs prior to discharge  -- Estimated LOS: 5-7 days  -- Discharge Concerns: Need to establish a safety plan; Medication compliance and effectiveness  -- Discharge Goals: Return home with  outpatient referrals for mental health follow-up including medication management/psychotherapy - Patient request residential rehab placement   I certify that inpatient services furnished can reasonably be expected to improve the patient's condition.    Carlyn Reichert, MD PGY-2

## 2023-02-24 NOTE — Progress Notes (Signed)
   02/24/23 1610  15 Minute Checks  Location Bedroom  Visual Appearance Calm  Behavior Sleeping  Sleep (Behavioral Health Patients Only)  Calculate sleep? (Click Yes once per 24 hr at 0600 safety check) Yes  Documented sleep last 24 hours 9

## 2023-02-24 NOTE — Progress Notes (Signed)
Did not attend group 
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
## 2023-02-24 NOTE — Progress Notes (Addendum)
Patient denies SI, HI and AVH this morning but states "too busy sleeping" to experience them. Patient states he is feeling "bad, dizzy and drained" from "coming down from [substances]." Patient has been isolated in room for the majority of the shift.  Patient left room to go to dinner and came to the med window and interacted pleasantly with this Clinical research associate. Patient was observed to be responding to internal stimuli. Patient endorses auditory hallucinations but "can't make out what they're saying." Safety is maintained with 15 minute checks  02/24/23 1100  Psych Admission Type (Psych Patients Only)  Admission Status Voluntary  Psychosocial Assessment  Patient Complaints Irritability;Isolation  Eye Contact Brief  Facial Expression Angry;Anxious  Affect Angry;Anxious  Speech Logical/coherent;Argumentative  Interaction Assertive  Motor Activity Other (Comment) (WDL)  Appearance/Hygiene In scrubs  Behavior Characteristics Irritable  Mood Angry  Thought Process  Coherency WDL  Content WDL  Delusions None reported or observed  Perception WDL  Hallucination None reported or observed  Judgment WDL  Confusion None  Danger to Self  Current suicidal ideation? Denies  Agreement Not to Harm Self Yes  Description of Agreement verbal  Danger to Others  Danger to Others None reported or observed

## 2023-02-24 NOTE — BHH Group Notes (Signed)
Adult Psychoeducational Group Note  Date:  02/24/2023 Time:  8:33 PM  Group Topic/Focus:  Wrap-Up Group:   The focus of this group is to help patients review their daily goal of treatment and discuss progress on daily workbooks.  Participation Level:  Did Not Attend  Participation Quality:    Affect:    Cognitive:    Insight:   Engagement in Group:    Modes of Intervention:    Additional Comments:    Osa Craver 02/24/2023, 8:33 PM

## 2023-02-24 NOTE — BHH Suicide Risk Assessment (Signed)
[facility] INPATIENT:  Family/Significant Other Suicide Prevention Education  Suicide Prevention Education:  Patient Refusal for Family/Significant Other Suicide Prevention Education: The patient Mark Hammond has refused to provide written consent for family/significant other to be provided Family/Significant Other Suicide Prevention Education during admission and/or prior to discharge.  Physician notified.  Jacqulyn Ducking [facility] 02/24/2023, 11:08 AM

## 2023-02-25 LAB — HEPATITIS PANEL, ACUTE
HCV Ab: REACTIVE — AB
Hep A IgM: NONREACTIVE
Hep B C IgM: NONREACTIVE
Hepatitis B Surface Ag: NONREACTIVE

## 2023-02-25 LAB — HEMOGLOBIN A1C
Hgb A1c MFr Bld: 5.5 % (ref 4.8–5.6)
Mean Plasma Glucose: 111 mg/dL

## 2023-02-25 LAB — POTASSIUM: Potassium: 4.4 mmol/L (ref 3.5–5.1)

## 2023-02-25 MED ORDER — CHOLECALCIFEROL 10 MCG (400 UNIT) PO TABS
400.0000 [IU] | ORAL_TABLET | Freq: Every day | ORAL | Status: DC
  Administered 2023-02-25 – 2023-02-28 (×4): 400 [IU] via ORAL
  Filled 2023-02-25 (×6): qty 1

## 2023-02-25 MED ORDER — VITAMIN B-1 100 MG PO TABS
100.0000 mg | ORAL_TABLET | Freq: Every day | ORAL | Status: DC
  Administered 2023-02-25 – 2023-02-28 (×4): 100 mg via ORAL
  Filled 2023-02-25 (×8): qty 1

## 2023-02-25 MED ORDER — POLYETHYLENE GLYCOL 3350 17 G PO PACK
17.0000 g | PACK | Freq: Every day | ORAL | Status: DC
  Administered 2023-02-25 – 2023-02-27 (×3): 17 g via ORAL
  Filled 2023-02-25 (×6): qty 1

## 2023-02-25 NOTE — Progress Notes (Signed)
Pt is A&OX4, calm, isolative, denies suicidal ideations, denies homicidal ideations, denies auditory hallucinations and denies visual hallucinations. Pt verbally agrees to approach staff if these become apparent and before harming self or others. Pt denies experiencing nightmares. Mood and affect are congruent. Pt appetite is ok. No complaints of anxiety, distress, pain and/or discomfort at this time. Pt's memory appears to be grossly intact, and Pt hasn't displayed any injurious behaviors. Pt is medication compliant. There's no evidence of suicidal intent. Psychomotor activity was WNL. No s/s of Parkinson, Dystonia, Akathisia and/or Tardive Dyskinesia noted.   

## 2023-02-25 NOTE — Progress Notes (Signed)
   02/25/23 0600  15 Minute Checks  Location Bedroom  Visual Appearance Calm  Behavior Sleeping  Sleep (Behavioral Health Patients Only)  Calculate sleep? (Click Yes once per 24 hr at 0600 safety check) Yes  Documented sleep last 24 hours 13.25
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
## 2023-02-25 NOTE — Group Note (Signed)
Date:  02/25/2023 Time:  9:08 PM  Group Topic/Focus:  Wrap-Up Group:   The focus of this group is to help patients review their daily goal of treatment and discuss progress on daily workbooks.    Participation Level:  Did Not Attend  Scot Dock 02/25/2023, 9:08 PM

## 2023-02-26 NOTE — Progress Notes (Signed)
   02/26/23 1000  Psych Admission Type (Psych Patients Only)  Admission Status Voluntary  Psychosocial Assessment  Patient Complaints Isolation;Anxiety  Eye Contact Fair  Facial Expression Anxious  Affect Anxious  Speech Logical/coherent  Interaction Assertive  Motor Activity Fidgety  Appearance/Hygiene In scrubs  Behavior Characteristics Anxious  Mood Anxious  Thought Process  Coherency WDL  Content WDL  Delusions None reported or observed  Perception WDL  Hallucination None reported or observed  Judgment WDL  Confusion None  Danger to Self  Current suicidal ideation? Denies  Agreement Not to Harm Self Yes  Description of Agreement verbal  Danger to Others  Danger to Others None reported or observed

## 2023-02-26 NOTE — [facility] Group Notes (Signed)
Adult Psychoeducational Group Note  Date:  02/26/2023 Time:  9:18 PM  Group Topic/Focus:  Wrap-Up Group:   The focus of this group is to help patients review their daily goal of treatment and discuss progress on daily workbooks.  Participation Level:  Did Not Attend  Participation Quality:   Did not attend  Affect:   Did not attend  Cognitive:   Did not attend  Insight: None  Engagement in Group:   Did not attend  Modes of Intervention:   Did not attend  Additional Comments:  Did not attend  [name] 02/26/2023, 9:18 PM

## 2023-02-26 NOTE — Progress Notes (Signed)
   02/25/23 2111  Psych Admission Type (Psych Patients Only)  Admission Status Voluntary  Psychosocial Assessment  Patient Complaints Anxiety  Eye Contact Fair  Facial Expression Anxious  Affect Anxious  Speech Logical/coherent  Interaction Assertive  Motor Activity Fidgety  Appearance/Hygiene In scrubs  Behavior Characteristics Anxious  Mood Pleasant;Anxious  Thought Process  Coherency WDL  Content WDL  Delusions None reported or observed  Perception WDL  Hallucination None reported or observed  Judgment WDL  Confusion None  Danger to Self  Current suicidal ideation? Denies  Danger to Others  Danger to Others None reported or observed

## 2023-02-26 NOTE — Progress Notes (Signed)
[facility] MD Progress Note  Date: 02/26/2023 2:26 PM Name: Mark Hammond  DOB: 12-10-1990 MRN: [id number] Unit: [id number]  CC: "bizarre behavior & cutting arm at [employer]"  REASON FOR ADMISSION   Mark Hammond is a 32 y.o. 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  Principal Problem: Substance induced mood disorder (HCC) Diagnosis: Principal Problem:   Substance induced mood disorder (HCC)  CHART REVIEW FROM LAST 24 HOURS   Adherent to scheduled meds: yes  Agitation PRNs: none Per nursing/CSW staff: no behavioral concerns, anxious early 5/26 AM relieved with vistaril, AH last night Groups: 0/1 Documented sleep last 24 hours: 8.75   SUBJECTIVE  Patient was initially seen asleep in bed mid morning, no acute distress, awoken easily. Patient was irritable and sarcastic during evaluation.  Denied side effects to Zyprexa. Informed patient of reactive HCV Ab. Patient does not remember if he has ever gone diagnosis with HCV infection, ever gotten the vaccine, never been treated for it.  Discussed with patients that there will be additional test for confirmation.  Otherwise he had no other questions or concerns.  Mood:Dysphoric, Irritable Sleep:Fair, energy is improving Appetite: Fair, improving  SI:No (Denied active or passive SI) HI:No (denied active or passive HI) WUJ:WJXBJY, Auditory (Initially denied AVH, but then became frustrated then said he was experiencing AVH. Unable to describe any specifics and became irritible when asked to describe. "Voices telling to fuck shit up, i'm a piece of shit, you're a  piece of shit" and more)  Review of Systems  Constitutional:  Positive for malaise/fatigue.  Respiratory:  Negative for shortness of breath.   Cardiovascular:  Negative for chest pain.  Gastrointestinal:  Positive for constipation. Negative for abdominal pain, diarrhea, nausea and vomiting.  Musculoskeletal:  Negative for myalgias.  Neurological:  Negative for dizziness and headaches.   HISTORY  Past Psychiatric History:  Diagnoses: Paranoid schizophrenia, cocaine use disorder, alcohol use disorder, substance induced mood d/o, substance induced psychosis, stimulant use d/o Inpatient treatment:  10/28/2019 @ this [facility], 11/15/2019 @ this [facility], 03/06/2019 @ this [facility], 01/14/2023 @ this [facility] Multiple ER visits related to suicidal ideations and substance use.  Suicide: Reports an overdose on pills in the past, but unable to recall when.  Homicide/aggression:  Chart rev - aggressive in the past related to arguments over alcohol and drugs, denies any legal charges related to this.  Chart rev - blacked out and almost killed someone, states it was a random stranger, states that he was having homicidal ideations towards everybody at the time.  Medication history:  Chart rev - trials of Vraylar in 2020, Prozac 10 mg in 2021, Seroquel 200 mg in 2021, hydroxyzine, trazodone, Risperdal 3 mg twice daily in 2021. Patient unable to recall if this medications were ever helpful, seems not to have been compliant outside of the hospital environment.  Medication compliance: poor Psychotherapy: none Current Psychiatrist: none Current therapist: none  Past Medical/Surgical History:  Medical Diagnoses: denied Prior Hosp: denied Prior Surgeries/Trauma: denied Concussions/Head Trauma/LOC: head trauma where he he hit his head in the past with a "[employer]", unsure if he had a concussion or not.  States that  he did not require hospitalization.  Unsure when this happened.  Seizures: denied PCP: Patient, No Pcp Per   Allergies: Codeine   Family Psychiatric History:  Medical: denied BiPD: unsure SCzA/SCZ: unsure Psych Rx: unsure Suicide: denied Homicide: denied Inpatient psych: unsure Substance use: mom - etoh  Social History:  Housing: housing instability - unhoused Finances: in april 2024 - cook and Oceanographer at [employer] Marital Status: single Support: denied Children: none Education: 9th grade Guns/Weapons: denied Legal: denied Sexual orientation: heterosexual, sexually active Per chart rev - Born and raised in [place], states that he is an only child, his mother is deceased, he has no contact with his father whom he refers to as "a piece of shit.   Substance Use History: Alcohol:  reports current alcohol use. Started alcohol use at age 34 years old, currently Drinks four 42 ounces of beer daily Nicotine:  reports that he has been smoking cigarettes. He has been smoking an average of 1 pack per day. He has never used smokeless tobacco. Marijuana: Yes, per UDS Stimulants:  Cocaine: Started using between ages 86 to 68 years old, currently uses 2 g daily Methamphetamines - 2x / lifetime  Opiates: fentanyl occasionally  Sedative/hypnotics: denied Hallucinogens: denied H/O seizure: Denies any history of seizures related to alcohol use, reports multiple blackouts in the past related to alcohol use.  Reports a history of tremors related to alcohol use, states that he has felt as though he needed alcohol first thing in the morning several times in the past, related to withdrawal symptoms.  H/O DT: denied H/O Detox / Rehab: denied  Past Medical History:  Past Medical History:  Diagnosis Date   Alcoholic gastritis    Bipolar 1 disorder (HCC)    Dental abscess    Depression    Drug abuse (HCC)    ETOH abuse    Hallucination    Schizo affective schizophrenia (HCC)     History reviewed. No pertinent surgical history. Family History:  Family History  Problem  Relation Age of Onset   Stroke Other    Social History:  Social History   Substance and Sexual Activity  Alcohol Use Yes   Comment: 4-5x 40oz beers daily     Social History   Substance and Sexual Activity  Drug Use Yes   Frequency: 7.0 times per week   Types: "Crack" cocaine, Heroin, MDMA (Ecstacy), Marijuana, Cocaine   Comment: frequent stimulant use    Social History   Socioeconomic History   Marital status: Single    Spouse name: Not on file   Number of children: Not on file   Years of education: Not on file   Highest education level: Not on file  Occupational History   Not on file  Tobacco Use   Smoking status: Every Day    Packs/day: 1    Types: Cigarettes   Smokeless tobacco: Never  Vaping Use   Vaping Use: Every day   Substances: THC  Substance and Sexual Activity   Alcohol use: Yes    Comment: 4-5x 40oz beers daily   Drug use: Yes    Frequency: 7.0 times per week    Types: "Crack" cocaine, Heroin, MDMA (Ecstacy), Marijuana, Cocaine    Comment: frequent stimulant use   Sexual activity: Yes  Other Topics Concern   Not on file  Social History Narrative   Pt lives in [place] with ex-girlfriend.  Pt is not followed by an outpatient psychiatric provider.   Social  Determinants of Health   Financial Resource Strain: Not on file  Food Insecurity: Food Insecurity Present (02/23/2023)   Hunger Vital Sign    Worried About Running Out of Food in the Last Year: Often true    Ran Out of Food in the Last Year: Sometimes true  Transportation Needs: Unmet Transportation Needs (02/23/2023)   PRAPARE - Administrator, Civil Service (Medical): Yes    Lack of Transportation (Non-Medical): Yes  Physical Activity: Not on file  Stress: Not on file  Social Connections: Not on file    Current Medications: Current Facility-Administered Medications  Medication Dose Route Frequency Provider Last Rate Last Admin   acetaminophen (TYLENOL) tablet 650 mg  650 mg  Oral Q6H PRN Lenox Ponds, NP       alum & mag hydroxide-simeth (MAALOX/MYLANTA) 200-200-20 MG/5ML suspension 30 mL  30 mL Oral Q4H PRN Lenox Ponds, NP       cholecalciferol (VITAMIN D3) 10 MCG (400 UNIT) tablet 400 Units  400 Units Oral Daily Princess Bruins, DO   400 Units at 02/26/23 1610   diphenhydrAMINE (BENADRYL) capsule 50 mg  50 mg Oral TID PRN Lenox Ponds, NP       Or   diphenhydrAMINE (BENADRYL) injection 50 mg  50 mg Intramuscular TID PRN Lenox Ponds, NP       haloperidol (HALDOL) tablet 5 mg  5 mg Oral TID PRN Lenox Ponds, NP       Or   haloperidol lactate (HALDOL) injection 5 mg  5 mg Intramuscular TID PRN Lenox Ponds, NP       hydrOXYzine (ATARAX) tablet 25 mg  25 mg Oral TID PRN Lenox Ponds, NP   25 mg at 02/26/23 9604   LORazepam (ATIVAN) tablet 2 mg  2 mg Oral TID PRN Lenox Ponds, NP       Or   LORazepam (ATIVAN) injection 2 mg  2 mg Intramuscular TID PRN Lenox Ponds, NP       magnesium hydroxide (MILK OF MAGNESIA) suspension 30 mL  30 mL Oral Daily PRN Lenox Ponds, NP       nicotine (NICODERM CQ - dosed in mg/24 hours) patch 21 mg  21 mg Transdermal Q0600 Lenox Ponds, NP   21 mg at 02/24/23 0837   OLANZapine (ZYPREXA) tablet 5 mg  5 mg Oral QHS Lenox Ponds, NP   5 mg at 02/25/23 2111   polyethylene glycol (MIRALAX / GLYCOLAX) packet 17 g  17 g Oral Q1200 Princess Bruins, DO   17 g at 02/26/23 1154   thiamine (Vitamin B-1) tablet 100 mg  100 mg Oral Daily Princess Bruins, DO   100 mg at 02/26/23 0807   traZODone (DESYREL) tablet 50 mg  50 mg Oral QHS PRN Lenox Ponds, NP   50 mg at 02/25/23 2111    OBJECTIVE  BP 123/81 (BP Location: Left Arm)   Pulse 89   Temp 98.4 F (36.9 C) (Oral)   Resp 18   Ht 5\' 2"  (1.575 m)   Wt 54.9 kg   SpO2 100%   BMI 22.13 kg/m   Physical Exam Physical Exam Vitals and nursing note reviewed.  Constitutional:      General: He is not in acute distress.    Appearance: He is not  ill-appearing, toxic-appearing or diaphoretic.  HENT:     Head: Normocephalic.  Pulmonary:     Effort: Pulmonary effort is  normal. No respiratory distress.  Neurological:     Mental Status: He is alert and oriented to person, place, and time.     AIMS:   ,  ,  ,  ,     CIWA:CIWA-Ar Total: 4  Psychiatric Specialty Exam: Presentation  General Appearance:Disheveled Eye Contact:Fair Speech:Clear and Coherent, Normal Rate Volume:Decreased Handedness:Right  Mood and Affect  Mood:Dysphoric, Irritable Affect:Appropriate, Congruent, Restricted  Thought Process  Thought Process:Goal Directed Descriptions of Associations:Circumstantial  Thought Content Suicidal Thoughts:No (Denied active or passive SI) Homicidal Thoughts:No (denied active or passive HI) Hallucinations:Visual, Auditory (Initially denied AVH, but then became frustrated then said he was experiencing AVH. Unable to describe any specifics and became irritible when asked to describe. "Voices telling to fuck shit up, i'm a piece of shit, you're a piece of shit" and more) Ideas of Reference:None Thought Content:Rumination, Perseveration, Scattered  Sensorium  Memory:Immediate Fair Judgment:Impaired Insight:Shallow  Executive Functions  Orientation:Full (Time, Place and Person) Language:Good Concentration:Good Attention:Good Recall:Good Fund of Knowledge:Good  Psychomotor Activity  Psychomotor Activity:Psychomotor Activity: Normal  Assets  Assets:Communication Skills, Desire for Improvement, Leisure Time, Talents/Skills  Sleep  Quality:Fair Documented sleep last 24 hours: 8.75   Lab Results:  Results for orders placed or performed during the hospital encounter of 02/23/23 (from the past 48 hour(s))  Hepatitis panel, acute     Status: Abnormal   Collection Time: 02/25/23  6:26 AM  Result Value Ref Range   Hepatitis B Surface Ag NON REACTIVE NON REACTIVE   HCV Ab Reactive (A) NON REACTIVE    Comment:  (NOTE) The CDC recommends that a Reactive HCV antibody result be followed up  with a HCV Nucleic Acid Amplification test.     Hep A IgM NON REACTIVE NON REACTIVE   Hep B C IgM NON REACTIVE NON REACTIVE    Comment: Performed at [facility], [address], [place], [place] [address]  Potassium     Status: None   Collection Time: 02/25/23  6:26 AM  Result Value Ref Range   Potassium 4.4 3.5 - 5.1 mmol/L    Comment: Performed at [facility], [address], [place], [place] [address]   Blood Alcohol level:  Lab Results  Component Value Date   ETH 107 (H) 02/23/2023   ETH 163 (H) 01/13/2023   Metabolic Disorder Labs: Lab Results  Component Value Date   HGBA1C 5.5 02/24/2023   MPG 111 02/24/2023   MPG 116.89 01/14/2023   Lab Results  Component Value Date   PROLACTIN 31.4 (H) 11/16/2019   Lab Results  Component Value Date   CHOL 147 02/24/2023   TRIG 96 02/24/2023   HDL 42 02/24/2023   CHOLHDL 3.5 02/24/2023   VLDL 19 02/24/2023   LDLCALC 86 02/24/2023   LDLCALC 57 01/14/2023   ASSESSMENT / PLAN  Diagnoses / Active Problems: Principal Problem:   Substance induced mood disorder (HCC)   Safety and Monitoring: Voluntary admission to inpatient psychiatric unit for safety, stabilization and treatment     2. Psychiatric Diagnoses and Treatment:   Substance induced mood d/o  Substance induced psychosis vs schizoaffective d/o bipolar type Sxs of "bizarre" behaviors and attempted suicide by cutting. Was not psychotic on admission. He is experiencing non-psychotic hallucinations of vague shadows, able to reality test Continued home zyprexa 5 mg qHS  AUD, severe (no sz or DT) BAL 107. Transaminitis per below.  CIWA with ativan PRN per protocol with thiamine  Stimulant use d/o  Cannabis use  d/o UDS + cocaine, THC. Remote IVDU, and declined HIV, RPR. Continued comfort PRNs Encouraged cessation   Tobacco use d/o NRTs Encouraged cessation    3. Medical Issues Being Addressed:  Transaminitis  ? R/O Chronic Hep C Down trending since April.  Alk phos elevated.  R/o hepatitis given history of substance use per above. Hep C Ab Reactive, reported a remote IVDU. ORDERED HCV quant ORDERED CMP  Mild Vitamin D insufficiency Continued vit D3 daily x3 months Recommend outpatient repeat vit D level in 63mo, ~Aug 2024  4. Discharge Planning:  Tentative Date: TBA  Location: TBA - requested rehab  Treatment Plan Summary: I certify that inpatient services furnished can reasonably be expected to improve the patient's condition.   Daily contact with patient to assess and evaluate symptoms and progress in treatment and Medication management Daily contact with patient to assess and evaluate symptoms and progress in treatment Patient's case to be discussed in multi-disciplinary team meeting Observation Level: q15 minute checks  Vital signs: q12 hours Precautions: suicide, elopement, and assault The risks/benefits/side-effects/alternatives to this medication were discussed in detail with the patient and time was given for questions. The patient consents to medication trial. The patient consents to medication trial. FDA black box warnings, if present, were discussed. Metabolic profile and EKG monitoring obtained while on an atypical antipsychotic  Encouraged patient to participate in unit milieu and in scheduled group therapies  Short Term Goals: Ability to identify changes in lifestyle to reduce recurrence of condition will improve, Ability to verbalize feelings will improve, Ability to disclose and discuss suicidal ideas, Ability to demonstrate self-control will improve, Ability to identify and develop effective coping behaviors will improve, Ability to maintain clinical measurements within normal limits will improve, Compliance with prescribed medications will improve, and Ability to identify triggers associated with substance abuse/mental  health issues will improve Long Term Goals: Improvement in symptoms so as ready for discharge Social work and case management to assist with discharge planning and identification of hospital follow-up needs prior to discharge Estimated LOS: 5-7 days Discharge Concerns: Need to establish a safety plan; Medication compliance and effectiveness Discharge Goals: Return home with outpatient referrals for mental health follow-up including medication management/psychotherapy  Total Time spent with patient:  Patient's case was discussed with Attending, see attestation for more information.   Signed: Princess Bruins, DO Psychiatry Resident, PGY-2 [facility]  [address] [place], [place] [address] Ph: [phone] Fax: [phone]

## 2023-02-26 NOTE — Plan of Care (Signed)
  Problem: Education: Goal: Emotional status will improve Outcome: Progressing   Problem: Coping: Goal: Ability to demonstrate self-control will improve Outcome: Progressing   

## 2023-02-26 NOTE — Progress Notes (Signed)
   02/26/23 0544  15 Minute Checks  Location Bedroom  Visual Appearance Calm  Behavior Sleeping  Sleep (Behavioral Health Patients Only)  Calculate sleep? (Click Yes once per 24 hr at 0600 safety check) Yes  Documented sleep last 24 hours 8.75

## 2023-02-27 LAB — COMPREHENSIVE METABOLIC PANEL
ALT: 193 U/L — ABNORMAL HIGH (ref 0–44)
AST: 168 U/L — ABNORMAL HIGH (ref 15–41)
Albumin: 2.9 g/dL — ABNORMAL LOW (ref 3.5–5.0)
Alkaline Phosphatase: 97 U/L (ref 38–126)
Anion gap: 8 (ref 5–15)
BUN: 9 mg/dL (ref 6–20)
CO2: 28 mmol/L (ref 22–32)
Calcium: 8.4 mg/dL — ABNORMAL LOW (ref 8.9–10.3)
Chloride: 102 mmol/L (ref 98–111)
Creatinine, Ser: 0.75 mg/dL (ref 0.61–1.24)
GFR, Estimated: >60 mL/min (ref >60)
Glucose, Bld: 96 mg/dL (ref 70–99)
Potassium: 4.1 mmol/L (ref 3.5–5.1)
Sodium: 138 mmol/L (ref 135–145)
Total Bilirubin: 0.5 mg/dL (ref 0.3–1.2)
Total Protein: 7.1 g/dL (ref 6.5–8.1)
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
## 2023-02-27 NOTE — Progress Notes (Signed)
   02/27/23 0600  15 Minute Checks  Location Bedroom  Visual Appearance Calm  Behavior Composed  Sleep (Behavioral Health Patients Only)  Calculate sleep? (Click Yes once per 24 hr at 0600 safety check) Yes  Documented sleep last 24 hours 7.25

## 2023-02-27 NOTE — BHH Group Notes (Signed)
Adult Psychoeducational Group Note  Date:  02/27/2023 Time:  6:14 PM  Group Topic/Focus:  Goals Group:   The focus of this group is to help patients establish daily goals to achieve during treatment and discuss how the patient can incorporate goal setting into their daily lives to aide in recovery. Orientation:   The focus of this group is to educate the patient on the purpose and policies of crisis stabilization and provide a format to answer questions about their admission.  The group details unit policies and expectations of patients while admitted.  Participation Level:  Did Not Attend  Participation Quality:    Affect:    Cognitive:    Insight:   Engagement in Group:    Modes of Intervention:    Additional Comments:    Sheran Lawless 02/27/2023, 6:14 PM

## 2023-02-27 NOTE — Progress Notes (Signed)
   02/27/23 0829  Psych Admission Type (Psych Patients Only)  Admission Status Voluntary  Psychosocial Assessment  Patient Complaints None  Eye Contact Fair  Facial Expression Flat  Affect Flat  Speech Logical/coherent  Interaction Assertive  Motor Activity Other (Comment) (WDL)  Appearance/Hygiene In scrubs  Behavior Characteristics Cooperative  Mood Pleasant  Thought Process  Coherency WDL  Content WDL  Delusions None reported or observed  Perception WDL  Hallucination Auditory  Judgment Poor  Confusion None  Danger to Self  Current suicidal ideation? Denies  Self-Injurious Behavior No self-injurious ideation or behavior indicators observed or expressed   Agreement Not to Harm Self Yes  Description of Agreement verbal  Danger to Others  Danger to Others None reported or observed   Pt reports hearing voices.

## 2023-02-27 NOTE — BHH Group Notes (Signed)
Adult Psychoeducational Group Note  Date:  02/27/2023 Time:  9:07 PM  Group Topic/Focus:  Wrap-Up Group:   The focus of this group is to help patients review their daily goal of treatment and discuss progress on daily workbooks.  Participation Level:  Did Not Attend  Participation Quality:   Did Not Attend  Affect:   Did Not Attend  Cognitive:   Did Not Attend  Insight: None  Engagement in Group:   Did Not Attend  Modes of Intervention:   Did Not Attend  Additional Comments:  Pt was encouraged to attend wrap up group but did not attend.  Felipa Furnace 02/27/2023, 9:07 PM

## 2023-02-27 NOTE — Group Note (Signed)
Recreation Therapy Group Note   Group Topic:Other  Group Date: 02/27/2023 Start Time: 1030 End Time: 1106 Facilitators: Gunnar Hereford-McCall, LRT,CTRS Location: [address] Day[address]   Goal Area(s) Addresses:  Patient will work together to answer trivia questions.   Patient will be respectful of others throughout activity.  Group Description: Music Trivia.  Patients were partnered up compete in activity.  LRT read trivia questions that covered different styles, types and genres of music.  The team with the highest score wins the game.    Affect/Mood: N/A   Participation Level: Did not attend    Clinical Observations/Individualized Feedback:     Plan: Continue to engage patient in RT group sessions 2-3x/week.   Nikos Anglemyer-McCall, LRT,CTRS 02/27/2023 12:57 PM

## 2023-02-27 NOTE — Progress Notes (Signed)
   02/27/23 2015  Psych Admission Type (Psych Patients Only)  Admission Status Voluntary  Psychosocial Assessment  Patient Complaints None  Eye Contact Fair  Facial Expression Flat  Affect Flat  Speech Logical/coherent  Interaction Assertive  Motor Activity Slow  Appearance/Hygiene In scrubs  Behavior Characteristics Cooperative  Mood Suspicious;Pleasant  Aggressive Behavior  Effect No apparent injury  Thought Process  Coherency WDL  Content WDL  Delusions WDL  Perception Hallucinations  Hallucination Auditory  Judgment Impaired  Confusion None  Danger to Self  Current suicidal ideation? Denies  Danger to Others  Danger to Others None reported or observed

## 2023-02-28 LAB — HCV RNA DIAGNOSIS, NAA

## 2023-02-28 LAB — HCV RNA, QUANTITATIVE REFLEX
HCV RNA - Quantitation: 6550000 IU/mL
HCV RNA - log10: 6.816 log10 IU/mL

## 2023-02-28 NOTE — BHH Group Notes (Signed)
Adult Psychoeducational Group Note  Date:  02/28/2023 Time:  9:10 PM  Group Topic/Focus:  Wrap-Up Group:   The focus of this group is to help patients review their daily goal of treatment and discuss progress on daily workbooks.  Participation Level:  Did Not Attend  Participation Quality:   Did not attend  Affect:   Did not attend  Cognitive:   Did not attend  Insight: None  Engagement in Group:   Did not attend  Modes of Intervention:   Did not attend  Additional Comments:  Did not attend  [name] 02/28/2023, 9:10 PM

## 2023-02-28 NOTE — Progress Notes (Signed)
   02/28/23 2015  Psych Admission Type (Psych Patients Only)  Admission Status Voluntary  Psychosocial Assessment  Patient Complaints Anxiety  Eye Contact Fair  Facial Expression Flat  Affect Flat  Speech Logical/coherent  Interaction Assertive  Motor Activity Slow  Appearance/Hygiene In scrubs  Behavior Characteristics Cooperative  Mood Suspicious  Aggressive Behavior  Effect No apparent injury  Thought Process  Coherency WDL  Content WDL  Delusions WDL  Perception Hallucinations  Hallucination Auditory  Judgment Impaired  Confusion None  Danger to Self  Current suicidal ideation? Denies  Danger to Others  Danger to Others None reported or observed

## 2023-02-28 NOTE — Group Note (Signed)
Recreation Therapy Group Note   Group Topic:Self-Esteem  Group Date: 02/28/2023 Start Time: 1036 End Time: 1110 Facilitators: Khris Jansson-McCall, LRT,CTRS Location: [address] Dayroom   Goal Area(s) Addresses:  Patient will identify and write at least one positive trait about themself. Patient will successfully identify influential people in their life and why they admire them. Patient will acknowledge the benefit of healthy self-esteem. Patient will endorse understanding of ways to increase self-esteem.   Group Description: LRT began group session with open dialogue asking the patients to define self-esteem and verbally identify positive qualities and traits people may possess. Patients were then instructed to design a personalized license plate, with words and drawings, representing at least 3 positive things about themselves. Pts were encouraged to include favorites, things they are proud of, what they enjoy doing, and dreams for their future. If a patient had a life motto or a meaningful phase that expressed their life values, pt's were asked to incorporate that into their design as well. Patients were given the opportunity to share their completed work with the group.   Affect/Mood: N/A   Participation Level: Did not attend    Clinical Observations/Individualized Feedback:     Plan: Continue to engage patient in RT group sessions 2-3x/week.   Briah Nary-McCall, LRT,CTRS 02/28/2023 1:13 PM

## 2023-02-28 NOTE — BHH Group Notes (Signed)
Adult Psychoeducational Group Note  Date:  02/28/2023 Time:  6:30 PM  Group Topic/Focus:  Goals Group:   The focus of this group is to help patients establish daily goals to achieve during treatment and discuss how the patient can incorporate goal setting into their daily lives to aide in recovery. Orientation:   The focus of this group is to educate the patient on the purpose and policies of crisis stabilization and provide a format to answer questions about their admission.  The group details unit policies and expectations of patients while admitted.  Participation Level:  Did Not Attend  Participation Quality:    Affect:    Cognitive:    Insight:   Engagement in Group:    Modes of Intervention:    Additional Comments:    Sheran Lawless 02/28/2023, 6:30 PM
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
## 2023-02-28 NOTE — Progress Notes (Signed)
   02/28/23 1100  Psych Admission Type (Psych Patients Only)  Admission Status Voluntary  Psychosocial Assessment  Patient Complaints Anxiety  Eye Contact Fair  Facial Expression Flat  Affect Flat  Speech Logical/coherent  Interaction Assertive  Motor Activity Slow  Appearance/Hygiene In scrubs  Behavior Characteristics Cooperative  Mood Suspicious  Thought Process  Coherency WDL  Content WDL  Delusions WDL  Perception Hallucinations  Hallucination Auditory  Judgment Impaired  Confusion None  Danger to Self  Current suicidal ideation? Denies  Danger to Others  Danger to Others None reported or observed   Dar Note: Patient presents with anxious affect and mood.  Medication given as prescribed.  Patient is withdrawn and isolative to his room.  Routine safety checks maintained.  Patient  is safe on the unit.  Refused to attend group when invited.  No interaction with staff or peers.

## 2023-03-01 DIAGNOSIS — B1920 Unspecified viral hepatitis C without hepatic coma: Secondary | ICD-10-CM | POA: Insufficient documentation

## 2023-03-01 DIAGNOSIS — F1994 Other psychoactive substance use, unspecified with psychoactive substance-induced mood disorder: Secondary | ICD-10-CM

## 2023-03-01 MED ORDER — OLANZAPINE 5 MG PO TABS: 5.0000 mg | ORAL_TABLET | Freq: Every day | ORAL | 0 refills | Status: DC

## 2023-03-01 NOTE — Progress Notes (Signed)
  [facility] Adult Case Management Discharge Plan :  Will you be returning to the same living situation after discharge:  Yes,  shelter At discharge, do you have transportation home?: Yes,  taxi from hospital Do you have the ability to pay for your medications: Yes,  insurance   Release of information consent forms completed and in the chart;  Patient's signature needed at discharge.  Patient to Follow up at:  Follow-up Information     Services, Alcohol And Drug Follow up.   Specialty: Behavioral Health Why: CHANGE OF ADDRESS:  [address] [place], [place] [address]  Please go to this provider at above address Monday through Friday from 7:00am-9:30am to get assessed and connected to appropriate substance use treatment programs. Contact information: [address] [place] [address] [phone]         [facility]. Go to.   Why: You have a hospital follow up appointment on May 30 at 9:30am. You will be seeing Bertram Denver. Contact information: [address] [place] [address] [phone]        [facility] Go to.   Why: You have a hospital follow up appointment on June 18th at 3pm.  This appointment will be in person. Contact information: [address] [place] [address] [phone]                 Next level of care provider has access to [facility] Link:no  Safety Planning and Suicide Prevention discussed: No. Patient declined consents      Has patient been referred to the Quitline?: Patient refused referral for treatment  Patient has been referred for addiction treatment: Yes, referral information given but appointment not made Alcohol and Drug Services,  they do not make appts, only have walk in hours for initial assessment. (list facility).  Zabdiel Dripps E Hisayo Delossantos, LCSW 03/01/2023, 9:58 AM

## 2023-03-01 NOTE — Progress Notes (Signed)
  [facility] Adult Case Management Discharge Plan :  Will you be returning to the same living situation after discharge:  Yes,  [facility] At discharge, do you have transportation home?: Yes,  taxi provided by hosptital Do you have the ability to pay for your medications: Yes,  insurance  Release of information consent forms completed and in the chart;  Patient's signature needed at discharge.  Patient to Follow up at:  Follow-up Information     Services, Alcohol And Drug Follow up.   Specialty: Behavioral Health Why: CHANGE OF ADDRESS:  [address] [place], [place] [address]  Please go to this provider at above address Monday through Friday from 7:00am-9:30am to get assessed and connected to appropriate substance use treatment programs. Contact information: [address] [place] [address] [phone]         [facility]. Go to.   Why: You have a hospital follow up appointment on May 30 at 9:30am. You will be seeing Bertram Denver. Contact information: [address] [place] [address] [phone]        [facility] Go to.   Why: You have a hospital follow up appointment on June 18th at 3pm.  This appointment will be in person. Contact information: [address] [place] [address] [phone]                 Next level of care provider has access to [facility] Link:no  Safety Planning and Suicide Prevention discussed: Yes,  with patient, patient declined consents      Has patient been referred to the Quitline?: Patient refused referral for treatment  Patient has been referred for addiction treatment: Yes, referral information given but appointment not made provided alcohol and drug services walk in hours as they don't provide appointments.  (list facility).  Lorane Cousar E Jennavieve Arrick, LCSW 03/01/2023, 11:41 AM

## 2023-03-01 NOTE — Transportation (Signed)
03/01/2023  Mark Hammond DOB: 04/02/1991 MRN: [id number]   RIDER WAIVER AND RELEASE OF LIABILITY  For the purposes of helping with transportation needs, [facility] partners with outside transportation providers (taxi companies, [place], Catering manager.) to give [employer] patients or other approved people the choice of on-demand rides [employer]") to our buildings for non-emergency visits.  By using [employer], I, the person signing this document, on behalf of myself and/or any legal minors (in my care using the [employer]), agree:  Science writer given to me are supplied by independent, outside transportation providers who do not work for, or have any affiliation with, [employer]. [place] is not a transportation company. [facility] has no control over the quality or safety of the rides I get using [employer]. [facility] has no control over whether any outside ride will happen on time or not. [place] gives no guarantee on the reliability, quality, safety, or availability on any rides, or that no mistakes will happen. I know and accept that traveling by vehicle (car, truck, SVU, Zenaida Niece, bus, taxi, etc.) has risks of serious injuries such as disability, being paralyzed, and death. I know and agree the risk of using [employer] is mine alone, and not [employer]. Transport Services are provided "as is" and as are available. The transportation providers are in charge for all inspections and care of the vehicles used to provide these rides. I agree not to take legal action against [facility], its agents, employees, officers, directors, representatives, insurers, attorneys, assigns, successors, subsidiaries, and affiliates at any time for any reasons related directly or indirectly to using [employer]. I also agree not to take legal action against [facility] or its affiliates for any injury, death, or damage to property caused by or related to using  [employer]. I have read this Waiver and Release of Liability, and I understand the terms used in it and their legal meaning. This Waiver is freely and voluntarily given with the understanding that my right (or any legal minors) to legal action against [place] relating to [employer] is knowingly given up to use these services.   I attest that I read the Ride Waiver and Release of Liability to Mark Hammond, gave Mr. Cassani the opportunity to ask questions and answered the questions asked (if any). I affirm that Mark Hammond then provided consent for assistance with transportation.

## 2023-03-01 NOTE — Group Note (Signed)
Recreation Therapy Group Note   Group Topic:Stress Management  Group Date: 03/01/2023 Start Time: 1030 End Time: 1055 Facilitators: Alishba Naples-McCall, LRT,CTRS Location: [address] Dayroom   Goal Area(s) Addresses:  Patient will identify positive stress management techniques. Patient will identify benefits of using stress management post d/c.  Group Description: Meditation.  LRT and patients discussed the importance of stress management.  Patients also discussed how mental, physical and the spiritual elements work together to bring balance to the body.  LRT then played a morning meditation that helped to get individuals focused on their day and get them in the mental space to face whatever may come.    Affect/Mood: N/A   Participation Level: Did not attend    Clinical Observations/Individualized Feedback:     Plan: Continue to engage patient in RT group sessions 2-3x/week.   Demerius Podolak-McCall, LRT,CTRS 03/01/2023 11:28 AM

## 2023-03-01 NOTE — Discharge Summary (Signed)
Physician Discharge Summary Note  Patient:  Mark Hammond is an 32 y.o., male MRN:  [id number] DOB:  11/25/1990 Patient phone:  [phone] (home)  Patient address:   [place] [address],  Total Time spent with patient: 15 min  Date of Admission:  02/23/2023 Date of Discharge: 03/01/2023  Reason for Admission:   The patient is a 32 year old male with a past psychiatric history of schizoaffective disorder, diagnosis made at a recent stay at the behavioral hospital in April of this year. At that time the patient presented with bizarre behavior, urine drug screen positive for methamphetamine and THC. On the present occasion, the patient was brought to the emergency department by ambulance after cutting his arm at [facility]. The patient is admitted to behavioral health hospital on a voluntary basis.   Principal Problem: Substance induced mood disorder (HCC) Discharge Diagnoses: Principal Problem:   Substance induced mood disorder (HCC) Active Problems:   Hepatitis C    Past Psychiatric History: See H and P  Past Medical History:  Past Medical History:  Diagnosis Date   Alcoholic gastritis    Bipolar 1 disorder (HCC)    Dental abscess    Depression    Drug abuse (HCC)    ETOH abuse    Hallucination    Schizo affective schizophrenia (HCC)    History reviewed. No pertinent surgical history. Family History:  Family History  Problem Relation Age of Onset   Stroke Other    Family Psychiatric  History: See H and P Social History:  Social History   Substance and Sexual Activity  Alcohol Use Yes   Comment: 4-5x 40oz beers daily     Social History   Substance and Sexual Activity  Drug Use Yes   Frequency: 7.0 times per week   Types: "Crack" cocaine, Heroin, MDMA (Ecstacy), Marijuana, Cocaine   Comment: frequent stimulant use    Social History   Socioeconomic History   Marital status: Single    Spouse name: Not on file   Number of children: Not on file   Years of  education: Not on file   Highest education level: Not on file  Occupational History   Not on file  Tobacco Use   Smoking status: Every Day    Packs/day: 1    Types: Cigarettes   Smokeless tobacco: Never  Vaping Use   Vaping Use: Every day   Substances: THC  Substance and Sexual Activity   Alcohol use: Yes    Comment: 4-5x 40oz beers daily   Drug use: Yes    Frequency: 7.0 times per week    Types: "Crack" cocaine, Heroin, MDMA (Ecstacy), Marijuana, Cocaine    Comment: frequent stimulant use   Sexual activity: Yes  Other Topics Concern   Not on file  Social History Narrative   Pt lives in [place] with ex-girlfriend.  Pt is not followed by an outpatient psychiatric provider.   Social Determinants of Health   Financial Resource Strain: Not on file  Food Insecurity: Food Insecurity Present (02/23/2023)   Hunger Vital Sign    Worried About Running Out of Food in the Last Year: Often true    Ran Out of Food in the Last Year: Sometimes true  Transportation Needs: Unmet Transportation Needs (02/23/2023)   PRAPARE - Administrator, Civil Service (Medical): Yes    Lack of Transportation (Non-Medical): Yes  Physical Activity: Not on file  Stress: Not on file  Social Connections: Not on file  Hospital Course:   During the patient's hospitalization, patient had extensive initial psychiatric evaluation, and follow-up psychiatric evaluations every day.   Upon evaluation, psychiatric diagnoses were given as follows:  Substance-induced mood disorder   Patient's psychiatric medications were adjusted on admission:  Continue Zyprexa 5 mg nightly   During the hospitalization, other adjustments were made to the patient's psychiatric medication regimen:  No further changes were made    Gradually, patient started adjusting to milieu.   Patient's care was discussed during the interdisciplinary team meeting every day during the hospitalization.   The patient denied having  side effects to prescribed psychiatric medication.   The patient reports their target psychiatric symptoms of depression and hallucinations responded well to the psychiatric medications, and the patient reports overall benefit other psychiatric hospitalization. Supportive psychotherapy was provided to the patient. The patient also participated in regular group therapy while admitted.    Labs were reviewed with the patient, and abnormal results were discussed with the patient.  Hepatitis C antibody was positive with hep C RNA greater than 6 million.  Primary care follow-up was arranged for the patient.  I discussed this results in detail with the patient and told him the importance of follow-up.  He expressed understanding and agreed to follow-up.   The patient denied having suicidal thoughts more than 48 hours prior to discharge.  Patient denies having homicidal thoughts.  Patient denies having auditory hallucinations.  Patient denies any visual hallucinations.  Patient denies having paranoid thoughts.   The patient is able to verbalize their individual safety plan to this provider.   It is recommended to the patient to continue psychiatric medications as prescribed, after discharge from the hospital.     It is recommended to the patient to follow up with your outpatient psychiatric provider and PCP.   Discussed with the patient, the impact of alcohol, drugs, tobacco have been there overall psychiatric and medical wellbeing, and total abstinence from substance use was recommended the patient.  The patient initially expressed interest in substance use treatment.  Unfortunately, he later refused to go to a residential rehab or seek substance use treatment.    Physical Findings: AIMS:  Facial and Oral Movements Muscles of Facial Expression: None, normal Lips and Perioral Area: None, normal Jaw: None, normal Tongue: None, normal, Extremity Movements Upper (arms, wrists, hands, fingers): None,  normal Lower (legs, knees, ankles, toes): None, normal,  Trunk Movements Neck, shoulders, hips: None, normal,  Overall Severity Severity of abnormal movements (highest score from questions above): None, normal Incapacitation due to abnormal movements: None, normal Patient's awareness of abnormal movements (rate only patient's report): No Awareness,  Dental Status Current problems with teeth and/or dentures?: No Does patient usually wear dentures?: No   Psychiatric Specialty Exam: Physical Exam Constitutional:      Appearance: the patient is not toxic-appearing.  Pulmonary:     Effort: Pulmonary effort is normal.  Neurological:     General: No focal deficit present.     Mental Status: the patient is alert and oriented to person, place, and time.   Review of Systems  Respiratory:  Negative for shortness of breath.   Cardiovascular:  Negative for chest pain.  Gastrointestinal:  Negative for abdominal pain, constipation, diarrhea, nausea and vomiting.  Neurological:  Negative for headaches.      BP 110/75 (BP Location: Right Arm)   Pulse 80   Temp 98.2 F (36.8 C) (Oral)   Resp 13   Ht 5\' 2"  (1.575 m)  Wt 54.9 kg   SpO2 99%   BMI 22.13 kg/m   General Appearance: Fairly Groomed  Eye Contact:  Good  Speech:  Clear and Coherent  Volume:  Normal  Mood:  Euthymic  Affect:  Congruent  Thought Process:  Coherent  Orientation:  Full (Time, Place, and Person)  Thought Content: Logical   Suicidal Thoughts:  No  Homicidal Thoughts:  No  Memory:  Immediate;   Good  Judgement:  fair  Insight:  poor  Psychomotor Activity:  Normal  Concentration:  Concentration: Good  Recall:  Good  Fund of Knowledge: Good  Language: Good  Akathisia:  No  Handed:  not assessed  AIMS (if indicated): not done  Assets:  Communication Skills Desire for Improvement Financial Resources/Insurance Housing Leisure Time Physical Health  ADL's:  Intact  Cognition: WNL  Sleep:  Fair       Social History   Tobacco Use  Smoking Status Every Day   Packs/day: 1   Types: Cigarettes  Smokeless Tobacco Never   Tobacco Cessation:  N/A, patient does not currently use tobacco products   Blood Alcohol level:  Lab Results  Component Value Date   ETH 107 (H) 02/23/2023   ETH 163 (H) 01/13/2023    Metabolic Disorder Labs:  Lab Results  Component Value Date   HGBA1C 5.5 02/24/2023   MPG 111 02/24/2023   MPG 116.89 01/14/2023   Lab Results  Component Value Date   PROLACTIN 31.4 (H) 11/16/2019   Lab Results  Component Value Date   CHOL 147 02/24/2023   TRIG 96 02/24/2023   HDL 42 02/24/2023   CHOLHDL 3.5 02/24/2023   VLDL 19 02/24/2023   LDLCALC 86 02/24/2023   LDLCALC 57 01/14/2023    See Psychiatric Specialty Exam and Suicide Risk Assessment completed by Attending Physician prior to discharge.  Discharge destination: self-care  Is patient on multiple antipsychotic therapies at discharge:  no Has Patient had three or more failed trials of antipsychotic monotherapy by history:  no  Recommended Plan for Multiple Antipsychotic Therapies: NA  Discharge Instructions     Diet - low sodium heart healthy   Complete by: As directed    Increase activity slowly   Complete by: As directed       Allergies as of 03/01/2023       Reactions   Codeine Other (See Comments)   Patient states parents told him he was allergic at a young age.        Medication List     STOP taking these medications    benztropine 0.5 MG tablet Commonly known as: COGENTIN   haloperidol 10 MG tablet Commonly known as: HALDOL   OLANZapine zydis 10 MG disintegrating tablet Commonly known as: ZYPREXA Replaced by: OLANZapine 5 MG tablet       TAKE these medications      Indication  hydrOXYzine 25 MG tablet Commonly known as: ATARAX Take 1 tablet (25 mg total) by mouth 3 (three) times daily as needed for anxiety.  Indication: Feeling Anxious   OLANZapine 5 MG  tablet Commonly known as: ZYPREXA Take 1 tablet (5 mg total) by mouth at bedtime. Replaces: OLANZapine zydis 10 MG disintegrating tablet  Indication: substance abuse psychosis   traZODone 150 MG tablet Commonly known as: DESYREL Take 1 tablet (150 mg total) by mouth at bedtime.  Indication: Trouble Sleeping   vitamin D3 25 MCG tablet Commonly known as: CHOLECALCIFEROL Take 1 tablet (1,000 Units total) by mouth daily.  Indication: Vitamin D Deficiency        Follow-up Information     Services, Alcohol And Drug Follow up.   Specialty: Behavioral Health Why: CHANGE OF ADDRESS:  [address] [place], [place] [address]  Please go to this provider at above address Monday through Friday from 7:00am-9:30am to get assessed and connected to appropriate substance use treatment programs. Contact information: [address] [place] [address] [phone]         [facility]. Go to.   Why: You have a hospital follow up appointment on May 30 at 9:30am. You will be seeing Bertram Denver. Contact information: [address] [place] [address] [phone]        [facility] Go to.   Why: You have a hospital follow up appointment on June 18th at 3pm.  This appointment will be in person. Contact information: [address] [place] [address] [phone]                 Follow-up recommendations:  Activity as tolerated. Diet as recommended by PCP. Keep all scheduled follow-up appointments as recommended.  Patient is instructed to take all prescribed medications as recommended. Report any side effects or adverse reactions to your outpatient psychiatrist. Patient is instructed to abstain from alcohol and illegal drugs while on prescription medications. In the event of worsening symptoms, patient is instructed to call the crisis hotline, 911, or go to the nearest emergency  department for evaluation and treatment.  Prescriptions given at discharge. Patient agreeable to plan. Given opportunity to ask questions. Appears to feel comfortable with discharge.  Patient is also instructed prior to discharge to: Take all medications as prescribed by mental healthcare provider. Report any adverse effects and or reactions from the medicines to outpatient provider promptly. Patient has been instructed & cautioned: To not engage in alcohol and or illegal drug use while on prescription medicines. In the event of worsening symptoms,  patient is instructed to call the crisis hotline, 911 and or go to the nearest ED for appropriate evaluation and treatment of symptoms. To follow-up with primary care provider for other medical issues, concerns and or health care needs  The patient was evaluated each day by a clinical provider to ascertain response to treatment. Improvement was noted by the patient's report of decreasing symptoms, improved sleep and appetite, affect, medication tolerance, behavior, and participation in unit programming.  Patient was asked each day to complete a self inventory noting mood, mental status, pain, new symptoms, anxiety and concerns.  Patient responded well to medication and being in a therapeutic and supportive environment. Positive and appropriate behavior was noted and the patient was motivated for recovery. The patient worked closely with the treatment team and case manager to develop a discharge plan with appropriate goals. Coping skills, problem solving as well as relaxation therapies were also part of the unit programming.  By the day of discharge patient was in much improved condition than upon admission.  Symptoms were reported as significantly decreased or resolved completely. The patient was motivated to continue taking medication with a goal of continued improvement in mental health.    Comments:  As above  Signed: Carlyn Reichert, MD PGY-2

## 2023-03-01 NOTE — BHH Suicide Risk Assessment (Signed)
[facility] Discharge Suicide Risk Assessment   Principal Problem: Substance induced mood disorder [facility]) Discharge Diagnoses: Principal Problem:   Substance induced mood disorder (HCC)    Total Time spent with patient: 15 min   During the patient's hospitalization, patient had extensive initial psychiatric evaluation, and follow-up psychiatric evaluations every day.  Upon evaluation, psychiatric diagnoses were given as follows:  Substance-induced mood disorder  Patient's psychiatric medications were adjusted on admission:  Continue Zyprexa 5 mg nightly  During the hospitalization, other adjustments were made to the patient's psychiatric medication regimen:  No further changes were made   Gradually, patient started adjusting to milieu.   Patient's care was discussed during the interdisciplinary team meeting every day during the hospitalization.  The patient denied having side effects to prescribed psychiatric medication.  The patient reports their target psychiatric symptoms of depression and hallucinations responded well to the psychiatric medications, and the patient reports overall benefit other psychiatric hospitalization. Supportive psychotherapy was provided to the patient. The patient also participated in regular group therapy while admitted.   Labs were reviewed with the patient, and abnormal results were discussed with the patient.  Hepatitis C antibody was positive with hep C RNA greater than 6 million.  Primary care follow-up was arranged for the patient.  I discussed this results in detail with the patient and told him the importance of follow-up.  He expressed understanding and agreed to follow-up.  The patient denied having suicidal thoughts more than 48 hours prior to discharge.  Patient denies having homicidal thoughts.  Patient denies having auditory hallucinations.  Patient denies any visual hallucinations.  Patient denies having paranoid thoughts.  The patient is able to  verbalize their individual safety plan to this provider.  It is recommended to the patient to continue psychiatric medications as prescribed, after discharge from the hospital.    It is recommended to the patient to follow up with your outpatient psychiatric provider and PCP.  Discussed with the patient, the impact of alcohol, drugs, tobacco have been there overall psychiatric and medical wellbeing, and total abstinence from substance use was recommended the patient.    Psychiatric Specialty Exam: Physical Exam Constitutional:      Appearance: the patient is not toxic-appearing.  Pulmonary:     Effort: Pulmonary effort is normal.  Neurological:     General: No focal deficit present.     Mental Status: the patient is alert and oriented to person, place, and time.   Review of Systems  Respiratory:  Negative for shortness of breath.   Cardiovascular:  Negative for chest pain.  Gastrointestinal:  Negative for abdominal pain, constipation, diarrhea, nausea and vomiting.  Neurological:  Negative for headaches.      BP 110/75 (BP Location: Right Arm)   Pulse 80   Temp 98.2 F (36.8 C) (Oral)   Resp 13   Ht 5\' 2"  (1.575 m)   Wt 54.9 kg   SpO2 99%   BMI 22.13 kg/m   General Appearance: Fairly Groomed  Eye Contact:  Good  Speech:  Clear and Coherent  Volume:  Normal  Mood:  Euthymic  Affect:  Congruent  Thought Process:  Coherent  Orientation:  Full (Time, Place, and Person)  Thought Content: Logical   Suicidal Thoughts:  No  Homicidal Thoughts:  No  Memory:  Immediate;   Good  Judgement:  fair  Insight:  poor  Psychomotor Activity:  Normal  Concentration:  Concentration: Good  Recall:  Good  Fund of Knowledge: Good  Language: Good  Akathisia:  No  Handed:  not assessed  AIMS (if indicated): not done  Assets:  Communication Skills Desire for Improvement Financial Resources/Insurance Housing Leisure Time Physical Health  ADL's:  Intact  Cognition: WNL  Sleep:   Fair     Mental Status Per Nursing Assessment::   On Admission:  Suicidal ideation indicated by patient  Demographic Factors:  Unemployed  Loss Factors: NA  Historical Factors: NA  Risk Reduction Factors:   Positive social support Coping skills Good therapeutic relationship  Continued Clinical Symptoms:  Depression  Cognitive Features That Contribute To Risk:  None  Suicide Risk:  Mild: Suicidal ideation of limited frequency, intensity, duration, and specificity.  There are no identifiable plans, no associated intent, mild dysphoria and related symptoms, good self-control (both objective and subjective assessment), few other risk factors, and identifiable protective factors, including available and accessible social support.   Follow-up Information     Services, Alcohol And Drug Follow up.   Specialty: Behavioral Health Why: CHANGE OF ADDRESS:  [address] [place], [place] [address]  Please go to this provider at above address Monday through Friday from 7:00am-9:30am to get assessed and connected to appropriate substance use treatment programs. Contact information: [address] [place] [address] [phone]         [facility]. Go to.   Why: You have a hospital follow up appointment on May 30 at 9:30am. You will be seeing Bertram Denver. Contact information: [address] [place] [address] [phone]        [facility] Go to.   Why: You have a hospital follow up appointment on June 18th at 3pm.  This appointment will be in person. Contact information: [address] [place] [address] [phone]                 Plan Of Care/Follow-up recommendations:  Activity as tolerated. Diet as recommended by PCP. Keep all scheduled follow-up appointments as recommended.  Patient is instructed to take all prescribed medications as recommended.  Report any side effects or adverse reactions to your outpatient psychiatrist. Patient is instructed to abstain from alcohol and illegal drugs while on prescription medications. In the event of worsening symptoms, patient is instructed to call the crisis hotline, 911, or go to the nearest emergency department for evaluation and treatment.  Prescriptions given at discharge. Patient agreeable to plan. Given opportunity to ask questions. Appears to feel comfortable with discharge.  Patient is also instructed prior to discharge to: Take all medications as prescribed by mental healthcare provider. Report any adverse effects and or reactions from the medicines to outpatient provider promptly. Patient has been instructed & cautioned: To not engage in alcohol and or illegal drug use while on prescription medicines. In the event of worsening symptoms,  patient is instructed to call the crisis hotline, 911 and or go to the nearest ED for appropriate evaluation and treatment of symptoms. To follow-up with primary care provider for other medical issues, concerns and or health care needs  The patient was evaluated each day by a clinical provider to ascertain response to treatment. Improvement was noted by the patient's report of decreasing symptoms, improved sleep and appetite, affect, medication tolerance, behavior, and participation in unit programming.  Patient was asked each day to complete a self inventory noting mood, mental status, pain, new symptoms, anxiety and concerns.  Patient responded well to medication  and being in a therapeutic and supportive environment. Positive and appropriate behavior was noted and the patient was motivated for recovery. The patient worked closely with the treatment team and case manager to develop a discharge plan with appropriate goals. Coping skills, problem solving as well as relaxation therapies were also part of the unit programming.  By the day of discharge patient was in much  improved condition than upon admission.  Symptoms were reported as significantly decreased or resolved completely. The patient was motivated to continue taking medication with a goal of continued improvement in mental health.     Carlyn Reichert, MD PGY-2

## 2023-03-01 NOTE — Progress Notes (Signed)
Patient ID: Mark Hammond, male   DOB: [birth year], 32 y.o.   MRN: [id number]   Pt ambulatory, alert, and oriented X4 on and off the unit. Education, support, and encouragement provided. Discharge summary/AVS, prescriptions, medications, and follow up appointments reviewed with pt and a copy of the AVS was given to pt. Medications "next dose" was also reviewed with pt and marked/notated on pt's med list on AVS. Suicide safety plan completed, reviewed with this RN, given to the patient, and a copy was placed in the chart. Suicide prevention resources also provided. Pt's belongings in locker #2 returned and belongings sheet signed. Pt denies SI/HI (plan and intention), and AVH. Pt denies any concerns at this time. Pt discharged to lobby.to [employer] taxi for transportation to his destination.

## 2023-03-01 NOTE — Progress Notes (Signed)
Transportation Needs: Unmet Transportation Needs (02/23/2023)   PRAPARE - Administrator, Civil Service (Medical): Yes    Lack of Transportation (Non-Medical): Yes     CSW provided taxi voucher for transportation needs.

## 2023-03-02 ENCOUNTER — Inpatient Hospital Stay: Payer: 59 | Admitting: Nurse Practitioner

## 2023-03-05 ENCOUNTER — Emergency Department (HOSPITAL_COMMUNITY)
Admission: EM | Admit: 2023-03-05 | Discharge: 2023-03-05 | Disposition: A | Discharge status: Home / Self Care | Payer: 59 | Attending: Emergency Medicine | Admitting: Emergency Medicine

## 2023-03-05 ENCOUNTER — Encounter (HOSPITAL_COMMUNITY): Payer: Self-pay | Admitting: Emergency Medicine

## 2023-03-05 ENCOUNTER — Emergency Department (HOSPITAL_COMMUNITY): Payer: 59

## 2023-03-05 ENCOUNTER — Other Ambulatory Visit: Payer: Self-pay

## 2023-03-05 DIAGNOSIS — M25511 Pain in right shoulder: Secondary | ICD-10-CM | POA: Insufficient documentation

## 2023-03-05 DIAGNOSIS — E876 Hypokalemia: Secondary | ICD-10-CM | POA: Insufficient documentation

## 2023-03-05 DIAGNOSIS — S0990XA Unspecified injury of head, initial encounter: Secondary | ICD-10-CM | POA: Insufficient documentation

## 2023-03-05 DIAGNOSIS — F1290 Cannabis use, unspecified, uncomplicated: Secondary | ICD-10-CM | POA: Diagnosis not present

## 2023-03-05 DIAGNOSIS — W19XXXA Unspecified fall, initial encounter: Secondary | ICD-10-CM | POA: Diagnosis not present

## 2023-03-05 DIAGNOSIS — S0003XA Contusion of scalp, initial encounter: Secondary | ICD-10-CM | POA: Diagnosis not present

## 2023-03-05 DIAGNOSIS — Z0471 Encounter for examination and observation following alleged adult physical abuse: Secondary | ICD-10-CM | POA: Diagnosis not present

## 2023-03-05 DIAGNOSIS — F10929 Alcohol use, unspecified with intoxication, unspecified: Secondary | ICD-10-CM | POA: Diagnosis not present

## 2023-03-05 DIAGNOSIS — F10129 Alcohol abuse with intoxication, unspecified: Secondary | ICD-10-CM | POA: Diagnosis not present

## 2023-03-05 DIAGNOSIS — F1490 Cocaine use, unspecified, uncomplicated: Secondary | ICD-10-CM | POA: Insufficient documentation

## 2023-03-05 DIAGNOSIS — R079 Chest pain, unspecified: Secondary | ICD-10-CM | POA: Diagnosis not present

## 2023-03-05 DIAGNOSIS — F10920 Alcohol use, unspecified with intoxication, uncomplicated: Secondary | ICD-10-CM | POA: Diagnosis not present

## 2023-03-05 DIAGNOSIS — Y906 Blood alcohol level of 120-199 mg/100 ml: Secondary | ICD-10-CM | POA: Diagnosis not present

## 2023-03-05 DIAGNOSIS — R000 Tachycardia, unspecified: Secondary | ICD-10-CM | POA: Diagnosis not present

## 2023-03-05 DIAGNOSIS — Z043 Encounter for examination and observation following other accident: Secondary | ICD-10-CM | POA: Diagnosis not present

## 2023-03-05 LAB — ETHANOL: Alcohol, Ethyl (B): 171 mg/dL — ABNORMAL HIGH (ref <10)

## 2023-03-05 LAB — COMPREHENSIVE METABOLIC PANEL
ALT: 261 U/L — ABNORMAL HIGH (ref 0–44)
AST: 287 U/L — ABNORMAL HIGH (ref 15–41)
Albumin: 3.1 g/dL — ABNORMAL LOW (ref 3.5–5.0)
Alkaline Phosphatase: 140 U/L — ABNORMAL HIGH (ref 38–126)
Anion gap: 11 (ref 5–15)
BUN: 10 mg/dL (ref 6–20)
CO2: 24 mmol/L (ref 22–32)
Calcium: 8.1 mg/dL — ABNORMAL LOW (ref 8.9–10.3)
Chloride: 102 mmol/L (ref 98–111)
Creatinine, Ser: 0.78 mg/dL (ref 0.61–1.24)
GFR, Estimated: >60 mL/min (ref >60)
Glucose, Bld: 209 mg/dL — ABNORMAL HIGH (ref 70–99)
Potassium: 3.2 mmol/L — ABNORMAL LOW (ref 3.5–5.1)
Sodium: 137 mmol/L (ref 135–145)
Total Bilirubin: 0.7 mg/dL (ref 0.3–1.2)
Total Protein: 7 g/dL (ref 6.5–8.1)

## 2023-03-05 LAB — RAPID URINE DRUG SCREEN, HOSP PERFORMED
Amphetamines: NOT DETECTED
Barbiturates: NOT DETECTED
Benzodiazepines: POSITIVE — AB
Cocaine: POSITIVE — AB
Opiates: NOT DETECTED
Tetrahydrocannabinol: POSITIVE — AB

## 2023-03-05 LAB — CBC
HCT: 40.4 % (ref 39.0–52.0)
Hemoglobin: 13.9 g/dL (ref 13.0–17.0)
MCH: 33.1 pg (ref 26.0–34.0)
MCHC: 34.4 g/dL (ref 30.0–36.0)
MCV: 96.2 fL (ref 80.0–100.0)
Platelets: 392 10*3/uL (ref 150–400)
RBC: 4.2 MIL/uL — ABNORMAL LOW (ref 4.22–5.81)
RDW: 13.8 % (ref 11.5–15.5)
WBC: 11.4 10*3/uL — ABNORMAL HIGH (ref 4.0–10.5)
nRBC: 0 % (ref 0.0–0.2)
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
## 2023-03-05 NOTE — Discharge Instructions (Signed)
Your scans looked good.  No broken bones.  Please follow-up with your regular doctor.
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
## 2023-03-05 NOTE — ED Notes (Signed)
Patient still refusing c-collar at this time.
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
## 2023-03-05 NOTE — ED Notes (Signed)
Patient refused to wear C- collar , states " I'm OK , I can move my neck ".

## 2023-05-12 ENCOUNTER — Emergency Department (HOSPITAL_COMMUNITY)
Admission: EM | Admit: 2023-05-12 | Discharge: 2023-05-12 | Disposition: A | Discharge status: Home / Self Care | Payer: 59 | Attending: Emergency Medicine | Admitting: Emergency Medicine

## 2023-05-12 ENCOUNTER — Other Ambulatory Visit: Payer: Self-pay

## 2023-05-12 DIAGNOSIS — F1010 Alcohol abuse, uncomplicated: Secondary | ICD-10-CM | POA: Diagnosis not present

## 2023-05-12 DIAGNOSIS — E876 Hypokalemia: Secondary | ICD-10-CM

## 2023-05-12 DIAGNOSIS — F10929 Alcohol use, unspecified with intoxication, unspecified: Secondary | ICD-10-CM | POA: Diagnosis not present

## 2023-05-12 DIAGNOSIS — R000 Tachycardia, unspecified: Secondary | ICD-10-CM | POA: Diagnosis not present

## 2023-05-12 DIAGNOSIS — Z79899 Other long term (current) drug therapy: Secondary | ICD-10-CM | POA: Diagnosis not present

## 2023-05-12 DIAGNOSIS — F1910 Other psychoactive substance abuse, uncomplicated: Secondary | ICD-10-CM | POA: Diagnosis not present

## 2023-05-12 DIAGNOSIS — R110 Nausea: Secondary | ICD-10-CM | POA: Diagnosis not present

## 2023-05-12 DIAGNOSIS — Z20822 Contact with and (suspected) exposure to covid-19: Secondary | ICD-10-CM | POA: Diagnosis not present

## 2023-05-12 DIAGNOSIS — F29 Unspecified psychosis not due to a substance or known physiological condition: Secondary | ICD-10-CM | POA: Diagnosis not present

## 2023-05-12 DIAGNOSIS — F19939 Other psychoactive substance use, unspecified with withdrawal, unspecified: Secondary | ICD-10-CM | POA: Diagnosis not present

## 2023-05-12 DIAGNOSIS — R252 Cramp and spasm: Secondary | ICD-10-CM | POA: Diagnosis not present

## 2023-05-12 LAB — CBC WITH DIFFERENTIAL/PLATELET
Abs Immature Granulocytes: 0.03 10*3/uL (ref 0.00–0.07)
Basophils Absolute: 0.1 10*3/uL (ref 0.0–0.1)
Basophils Relative: 1 %
Eosinophils Absolute: 0.1 10*3/uL (ref 0.0–0.5)
Eosinophils Relative: 1 %
HCT: 45.3 % (ref 39.0–52.0)
Hemoglobin: 15.7 g/dL (ref 13.0–17.0)
Immature Granulocytes: 0 %
Lymphocytes Relative: 17 %
Lymphs Abs: 1.1 10*3/uL (ref 0.7–4.0)
MCH: 32.9 pg (ref 26.0–34.0)
MCHC: 34.7 g/dL (ref 30.0–36.0)
MCV: 95 fL (ref 80.0–100.0)
Monocytes Absolute: 0.5 10*3/uL (ref 0.1–1.0)
Monocytes Relative: 8 %
Neutro Abs: 5 10*3/uL (ref 1.7–7.7)
Neutrophils Relative %: 73 %
Platelets: 194 10*3/uL (ref 150–400)
RBC: 4.77 MIL/uL (ref 4.22–5.81)
RDW: 13 % (ref 11.5–15.5)
WBC: 6.8 10*3/uL (ref 4.0–10.5)
nRBC: 0 % (ref 0.0–0.2)

## 2023-05-12 LAB — RAPID URINE DRUG SCREEN, HOSP PERFORMED
Amphetamines: POSITIVE — AB
Barbiturates: NOT DETECTED
Benzodiazepines: NOT DETECTED
Cocaine: POSITIVE — AB
Opiates: NOT DETECTED
Tetrahydrocannabinol: NOT DETECTED

## 2023-05-12 LAB — COMPREHENSIVE METABOLIC PANEL
ALT: 436 U/L — ABNORMAL HIGH (ref 0–44)
AST: 566 U/L — ABNORMAL HIGH (ref 15–41)
Albumin: 3.6 g/dL (ref 3.5–5.0)
Alkaline Phosphatase: 127 U/L — ABNORMAL HIGH (ref 38–126)
Anion gap: 15 (ref 5–15)
BUN: 7 mg/dL (ref 6–20)
CO2: 21 mmol/L — ABNORMAL LOW (ref 22–32)
Calcium: 8.7 mg/dL — ABNORMAL LOW (ref 8.9–10.3)
Chloride: 97 mmol/L — ABNORMAL LOW (ref 98–111)
Creatinine, Ser: 0.61 mg/dL (ref 0.61–1.24)
GFR, Estimated: >60 mL/min (ref >60)
Glucose, Bld: 101 mg/dL — ABNORMAL HIGH (ref 70–99)
Potassium: 3.1 mmol/L — ABNORMAL LOW (ref 3.5–5.1)
Sodium: 133 mmol/L — ABNORMAL LOW (ref 135–145)
Total Bilirubin: 1.3 mg/dL — ABNORMAL HIGH (ref 0.3–1.2)
Total Protein: 8.1 g/dL (ref 6.5–8.1)

## 2023-05-12 LAB — SARS CORONAVIRUS 2 BY RT PCR: SARS Coronavirus 2 by RT PCR: NEGATIVE

## 2023-05-12 LAB — ETHANOL: Alcohol, Ethyl (B): <10 mg/dL (ref <10)

## 2023-05-12 LAB — CK: Total CK: 438 U/L — ABNORMAL HIGH (ref 49–397)

## 2023-05-12 MED ORDER — POTASSIUM CHLORIDE CRYS ER 20 MEQ PO TBCR
40.0000 meq | EXTENDED_RELEASE_TABLET | Freq: Once | ORAL | Status: AC
  Administered 2023-05-12: 40 meq via ORAL
  Filled 2023-05-12: qty 2

## 2023-05-12 MED ORDER — THIAMINE HCL 100 MG/ML IJ SOLN
100.0000 mg | Freq: Once | INTRAMUSCULAR | Status: AC
  Administered 2023-05-12: 100 mg via INTRAVENOUS
  Filled 2023-05-12: qty 2

## 2023-05-12 MED ORDER — ADULT MULTIVITAMIN W/MINERALS CH
1.0000 | ORAL_TABLET | Freq: Once | ORAL | Status: AC
  Administered 2023-05-12: 1 via ORAL
  Filled 2023-05-12: qty 1

## 2023-05-12 MED ORDER — POTASSIUM CHLORIDE CRYS ER 20 MEQ PO TBCR: 20.0000 meq | EXTENDED_RELEASE_TABLET | Freq: Every day | ORAL | 0 refills | Status: DC

## 2023-05-12 MED ORDER — ONDANSETRON HCL 4 MG/2ML IJ SOLN
4.0000 mg | Freq: Once | INTRAMUSCULAR | Status: AC
  Administered 2023-05-12: 4 mg via INTRAVENOUS
  Filled 2023-05-12: qty 2

## 2023-05-12 MED ORDER — LACTATED RINGERS IV BOLUS
1000.0000 mL | Freq: Once | INTRAVENOUS | Status: AC
  Administered 2023-05-12: 1000 mL via INTRAVENOUS

## 2023-05-12 MED ORDER — FOLIC ACID 1 MG PO TABS
1.0000 mg | ORAL_TABLET | Freq: Once | ORAL | Status: AC
  Administered 2023-05-12: 1 mg via ORAL
  Filled 2023-05-12: qty 1

## 2023-05-12 MED ORDER — SODIUM CHLORIDE 0.9 % IV BOLUS: 1000.0000 mL | Freq: Once | INTRAVENOUS | Status: DC

## 2023-05-12 NOTE — ED Provider Notes (Signed)
[place] [facility] AT [facility] Provider Note   CSN: [id number] Arrival date & time: 05/12/23  0741     History  Chief Complaint  Patient presents with   Alcohol Intoxication   Addiction Problem    Mark Hammond is a 32 y.o. male with history of cocaine use disorder, methamphetamine use disorder, alcohol use disorder, substance-induced mood disorders including major depressive disorder, schizoaffective disorder, presents with concern for nausea, body aches, leg cramping that started this morning.  He reports he is methamphetamine, crack cocaine, four 40 ounce beers, about 3 energy drinks last night.  He has been drinking about three 40's a day. He also took 1 Tylenol pill.  This morning he started experiencing the leg cramping, body aches, nausea and feels it is due to the substances he used last night.  Denies any tremors, hallucinations, sweating, fevers, chills, cough, shortness of breath.  Denies any sick contacts   Reports he is supposed to be on medication for his mood disorders, but has not taken these medications since his hospitalization in May.   Alcohol Intoxication       Home Medications Prior to Admission medications   Medication Sig Start Date End Date Taking? Authorizing Provider  ibuprofen (ADVIL) 200 MG tablet Take 200 mg by mouth as needed for moderate pain or headache.   Yes [provider]  potassium chloride SA (KLOR-CON M) 20 MEQ tablet Take 1 tablet (20 mEq total) by mouth daily for 7 days. 05/12/23 05/19/23 Yes Arabella Merles, PA-C      Allergies    Codeine    Review of Systems   Review of Systems  Constitutional:  Negative for chills, diaphoresis and fever.  Psychiatric/Behavioral:  Negative for hallucinations.     Physical Exam Updated Vital Signs BP (!) 140/70 (BP Location: Right Arm)   Pulse 90   Temp 98.4 F (36.9 C) (Oral)   Resp 17   Ht 5\' 3"  (1.6 m)   Wt 56.7 kg   SpO2 99%   BMI 22.14 kg/m  Physical  Exam Vitals and nursing note reviewed.  Constitutional:      General: He is not in acute distress.    Appearance: He is well-developed.     Comments: Overall well-appearing No tremors, no diaphoresis  HENT:     Head: Normocephalic and atraumatic.     Mouth/Throat:     Mouth: Mucous membranes are dry.  Eyes:     Conjunctiva/sclera: Conjunctivae normal.     Pupils: Pupils are equal, round, and reactive to light.  Cardiovascular:     Rate and Rhythm: Normal rate and regular rhythm.     Heart sounds: No murmur heard.    Comments: 2+ dorsalis pedis and posterior tibialis pulse Pulmonary:     Effort: Pulmonary effort is normal. No respiratory distress.     Breath sounds: Normal breath sounds.  Abdominal:     Palpations: Abdomen is soft.     Tenderness: There is no abdominal tenderness.  Musculoskeletal:        General: No swelling.     Cervical back: Neck supple.     Comments: No edema, erythema of the calves bilaterally No tenderness to palpation of the calves bilaterally  Skin:    General: Skin is warm and dry.     Capillary Refill: Capillary refill takes less than 2 seconds.     Comments: No erythema, edema, ecchymoses of the lower extremities bilaterally  Neurological:     General:  No focal deficit present.     Mental Status: He is alert.     Gait: Gait normal.     Comments: No hallucinations  Psychiatric:        Mood and Affect: Mood normal.     ED Results / Procedures / Treatments   Labs (all labs ordered are listed, but only abnormal results are displayed) Labs Reviewed  COMPREHENSIVE METABOLIC PANEL - Abnormal; Notable for the following components:      Result Value   Sodium 133 (*)    Potassium 3.1 (*)    Chloride 97 (*)    CO2 21 (*)    Glucose, Bld 101 (*)    Calcium 8.7 (*)    AST 566 (*)    ALT 436 (*)    Alkaline Phosphatase 127 (*)    Total Bilirubin 1.3 (*)    All other components within normal limits  CK - Abnormal; Notable for the following  components:   Total CK 438 (*)    All other components within normal limits  SARS CORONAVIRUS 2 BY RT PCR  ETHANOL  CBC WITH DIFFERENTIAL/PLATELET  RAPID URINE DRUG SCREEN, HOSP PERFORMED    EKG None  Radiology No results found.  Procedures Procedures    Medications Ordered in ED Medications  ondansetron (ZOFRAN) injection 4 mg (4 mg Intravenous Given 05/12/23 0828)  thiamine (VITAMIN B1) injection 100 mg (100 mg Intravenous Given 05/12/23 0831)  folic acid (FOLVITE) tablet 1 mg (1 mg Oral Given 05/12/23 0943)  multivitamin with minerals tablet 1 tablet (1 tablet Oral Given 05/12/23 0826)  lactated ringers bolus 1,000 mL (0 mLs Intravenous Stopped 05/12/23 0944)  potassium chloride SA (KLOR-CON M) CR tablet 40 mEq (40 mEq Oral Given 05/12/23 1006)    ED Course/ Medical Decision Making/ A&P                                 Medical Decision Making Amount and/or Complexity of Data Reviewed Labs: ordered.  Risk Prescription drug management.   32 y.o. male with pertinent past medical history of cocaine use disorder, methamphetamine use disorder, alcohol use disorder, substance-induced mood disorders including major depressive disorder, schizoaffective disorder presents to the ED for concern of nausea, body aches that started this morning  Differential diagnosis includes but is not limited to alcohol withdrawal, substance use disorder, rhabdomyolysis, COVID, electrolyte abnormality, vitamin deficiency, pancreatitis  ED Course:  Patient overall well-appearing, vital signs stable except for mildly elevated blood pressure of 140/70.  He is not diaphoretic, hypotensive, diaphoretic, anxious appearing.  CIWA score of 1.  Given report of bodyaches and nausea, COVID test ordered.  Also obtain CK to evaluate for possible rhabdomyolysis, although feel this is less likely.  He denies injecting any drugs, no indication for hepatitis B screening at this time.  He was given bolus of LR, thiamine,  folic acid, multivitamin here. Patient CMP shows elevation in LFTs, suspect this is due alcohol use given AST predominance.   He denies any abdominal pain or vomiting that would warrant further workup for pancreatitis.  His CK was elevated here today at 438, suspect this is due to dehydration.  He was given a bolus of LR.  He was seen to have a low potassium of 3.1, 40 mEq oral K given here today prior to discharge.  Suspect this may be contributing to muscle cramps along with dehydration from alcohol use.  COVID-negative, alcohol  level undetectable at this time.   Patient appropriate for discharge home.  We talked about following up with [facility]  as soon as possible and taking prescribed potassium supplementation at home.  Impression: Hypokalemia Substance use disorder  Disposition:  The patient was discharged home with instructions to follow up with [facility] as soon as possible to discuss his substance use disorder.  Contact information for them provided.  Take potassium supplementation as prescribed Return precautions given.  Lab Tests: I Ordered, and personally interpreted labs.  The pertinent results include:   CBC with potassium at 3.1, elevated AST at 566, ALT at 436, alk phos 127, bilirubin 1.3 CK elevated at 438 COVID-negative Serum alcohol level undetectable   Cardiac Monitoring: / EKG: The patient was maintained on a cardiac monitor.  I personally viewed and interpreted the cardiac monitored which showed an underlying rhythm of: Normal sinus rhythm  External records from outside source obtained and reviewed including Previous hospital stay from May 2024   Co morbidities that complicate the patient evaluation  cocaine use disorder, methamphetamine use disorder, alcohol use disorder, substance-induced mood disorders including major depressive disorder, schizoaffective disorder  Social Determinants of Health:  Substance use disorder, transportation difficulties               Final Clinical Impression(s) / ED Diagnoses Final diagnoses:  Polysubstance abuse (HCC)  Hypokalemia    Rx / DC Orders ED Discharge Orders          Ordered    potassium chloride SA (KLOR-CON M) 20 MEQ tablet  Daily        05/12/23 1001              Arabella Merles, PA-C 05/12/23 1008    Laurence Spates, MD 05/14/23 [phone]

## 2023-05-12 NOTE — Discharge Instructions (Addendum)
Please go to the behavioral health center listed below as needed for treatment for your substance use disorder. They are open 24/7. You can go at any time.  Your potassium was low here today. You have been prescribed a potassium supplement to take.  Please take this as prescribed.  Please return if you have tremors, hallucinations, fevers, any other new or concerning symptoms.

## 2023-05-12 NOTE — ED Notes (Signed)
CK added on by Caryn Bee in lab
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
## 2023-05-28 ENCOUNTER — Emergency Department (HOSPITAL_COMMUNITY)
Admission: EM | Admit: 2023-05-28 | Discharge: 2023-05-28 | Disposition: A | Discharge status: Home / Self Care | Payer: 59 | Attending: Emergency Medicine | Admitting: Emergency Medicine

## 2023-05-28 ENCOUNTER — Other Ambulatory Visit: Payer: Self-pay

## 2023-05-28 ENCOUNTER — Emergency Department (HOSPITAL_COMMUNITY): Payer: 59

## 2023-05-28 ENCOUNTER — Encounter (HOSPITAL_COMMUNITY): Payer: Self-pay | Admitting: Emergency Medicine

## 2023-05-28 DIAGNOSIS — S0990XA Unspecified injury of head, initial encounter: Secondary | ICD-10-CM | POA: Diagnosis not present

## 2023-05-28 DIAGNOSIS — R58 Hemorrhage, not elsewhere classified: Secondary | ICD-10-CM | POA: Diagnosis not present

## 2023-05-28 DIAGNOSIS — S301XXA Contusion of abdominal wall, initial encounter: Secondary | ICD-10-CM | POA: Diagnosis not present

## 2023-05-28 DIAGNOSIS — R079 Chest pain, unspecified: Secondary | ICD-10-CM | POA: Insufficient documentation

## 2023-05-28 DIAGNOSIS — S0101XA Laceration without foreign body of scalp, initial encounter: Secondary | ICD-10-CM | POA: Insufficient documentation

## 2023-05-28 DIAGNOSIS — R932 Abnormal findings on diagnostic imaging of liver and biliary tract: Secondary | ICD-10-CM | POA: Diagnosis not present

## 2023-05-28 DIAGNOSIS — R599 Enlarged lymph nodes, unspecified: Secondary | ICD-10-CM | POA: Diagnosis not present

## 2023-05-28 DIAGNOSIS — R109 Unspecified abdominal pain: Secondary | ICD-10-CM | POA: Diagnosis not present

## 2023-05-28 DIAGNOSIS — F10929 Alcohol use, unspecified with intoxication, unspecified: Secondary | ICD-10-CM | POA: Diagnosis not present

## 2023-05-28 DIAGNOSIS — G4489 Other headache syndrome: Secondary | ICD-10-CM | POA: Diagnosis not present

## 2023-05-28 DIAGNOSIS — R519 Headache, unspecified: Secondary | ICD-10-CM | POA: Diagnosis not present

## 2023-05-28 DIAGNOSIS — S199XXA Unspecified injury of neck, initial encounter: Secondary | ICD-10-CM | POA: Diagnosis not present

## 2023-05-28 DIAGNOSIS — S2020XA Contusion of thorax, unspecified, initial encounter: Secondary | ICD-10-CM | POA: Diagnosis not present

## 2023-05-28 DIAGNOSIS — Z79899 Other long term (current) drug therapy: Secondary | ICD-10-CM | POA: Insufficient documentation

## 2023-05-28 LAB — CBC WITH DIFFERENTIAL/PLATELET
Abs Immature Granulocytes: 0.04 10*3/uL (ref 0.00–0.07)
Basophils Absolute: 0.1 10*3/uL (ref 0.0–0.1)
Basophils Relative: 1 %
Eosinophils Absolute: 0.1 10*3/uL (ref 0.0–0.5)
Eosinophils Relative: 1 %
HCT: 41.5 % (ref 39.0–52.0)
Hemoglobin: 14.3 g/dL (ref 13.0–17.0)
Immature Granulocytes: 0 %
Lymphocytes Relative: 27 %
Lymphs Abs: 3 10*3/uL (ref 0.7–4.0)
MCH: 34.2 pg — ABNORMAL HIGH (ref 26.0–34.0)
MCHC: 34.5 g/dL (ref 30.0–36.0)
MCV: 99.3 fL (ref 80.0–100.0)
Monocytes Absolute: 0.7 10*3/uL (ref 0.1–1.0)
Monocytes Relative: 6 %
Neutro Abs: 7.3 10*3/uL (ref 1.7–7.7)
Neutrophils Relative %: 65 %
Platelets: 244 10*3/uL (ref 150–400)
RBC: 4.18 MIL/uL — ABNORMAL LOW (ref 4.22–5.81)
RDW: 13.2 % (ref 11.5–15.5)
WBC: 11.2 10*3/uL — ABNORMAL HIGH (ref 4.0–10.5)
nRBC: 0 % (ref 0.0–0.2)

## 2023-05-28 LAB — BASIC METABOLIC PANEL
Anion gap: 11 (ref 5–15)
BUN: 6 mg/dL (ref 6–20)
CO2: 22 mmol/L (ref 22–32)
Calcium: 8 mg/dL — ABNORMAL LOW (ref 8.9–10.3)
Chloride: 106 mmol/L (ref 98–111)
Creatinine, Ser: 0.7 mg/dL (ref 0.61–1.24)
GFR, Estimated: >60 mL/min (ref >60)
Glucose, Bld: 100 mg/dL — ABNORMAL HIGH (ref 70–99)
Potassium: 3.1 mmol/L — ABNORMAL LOW (ref 3.5–5.1)
Sodium: 139 mmol/L (ref 135–145)

## 2023-05-28 LAB — RAPID URINE DRUG SCREEN, HOSP PERFORMED
Amphetamines: POSITIVE — AB
Barbiturates: NOT DETECTED
Benzodiazepines: NOT DETECTED
Cocaine: POSITIVE — AB
Opiates: POSITIVE — AB
Tetrahydrocannabinol: POSITIVE — AB

## 2023-05-28 LAB — ETHANOL: Alcohol, Ethyl (B): 279 mg/dL — ABNORMAL HIGH (ref <10)

## 2023-05-28 MED ORDER — IOHEXOL 350 MG/ML SOLN
75.0000 mL | Freq: Once | INTRAVENOUS | Status: AC | PRN
  Administered 2023-05-28: 75 mL via INTRAVENOUS

## 2023-05-28 MED ORDER — LIDOCAINE-EPINEPHRINE-TETRACAINE (LET) TOPICAL GEL
3.0000 mL | Freq: Once | TOPICAL | Status: AC
  Administered 2023-05-28: 3 mL via TOPICAL
  Filled 2023-05-28: qty 3

## 2023-05-28 MED ORDER — POTASSIUM CHLORIDE CRYS ER 20 MEQ PO TBCR
40.0000 meq | EXTENDED_RELEASE_TABLET | Freq: Once | ORAL | Status: AC
  Administered 2023-05-28: 40 meq via ORAL
  Filled 2023-05-28: qty 2
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
## 2023-05-28 NOTE — ED Notes (Signed)
Patient transported to CT with RN 
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
## 2023-06-01 ENCOUNTER — Other Ambulatory Visit: Payer: Self-pay

## 2023-06-01 ENCOUNTER — Encounter (HOSPITAL_COMMUNITY): Payer: Self-pay

## 2023-06-01 ENCOUNTER — Emergency Department (HOSPITAL_COMMUNITY)
Admission: EM | Admit: 2023-06-01 | Discharge: 2023-06-02 | Disposition: A | Discharge status: Home / Self Care | Payer: 59 | Attending: Emergency Medicine | Admitting: Emergency Medicine

## 2023-06-01 DIAGNOSIS — F1090 Alcohol use, unspecified, uncomplicated: Secondary | ICD-10-CM | POA: Diagnosis not present

## 2023-06-01 DIAGNOSIS — R0902 Hypoxemia: Secondary | ICD-10-CM | POA: Diagnosis not present

## 2023-06-01 DIAGNOSIS — F10920 Alcohol use, unspecified with intoxication, uncomplicated: Secondary | ICD-10-CM | POA: Diagnosis not present

## 2023-06-01 DIAGNOSIS — R1030 Lower abdominal pain, unspecified: Secondary | ICD-10-CM | POA: Diagnosis not present

## 2023-06-01 DIAGNOSIS — F10129 Alcohol abuse with intoxication, unspecified: Secondary | ICD-10-CM | POA: Diagnosis not present

## 2023-06-01 DIAGNOSIS — R1084 Generalized abdominal pain: Secondary | ICD-10-CM | POA: Diagnosis not present

## 2023-06-01 NOTE — ED Triage Notes (Signed)
[place] [facility] [place] [facility] [place]. Heavily intoxicated.

## 2023-06-01 NOTE — ED Notes (Signed)
Attempted to obtain oral and axillary temp, however pt will not hold thermometer under tongue and will not hold arm in place long enough to get an temperature reading. Triage RN notified.
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
## 2023-06-02 NOTE — Discharge Instructions (Signed)
Plenty of fluids.  Follow-up with your family doctor if any problem.

## 2023-06-02 NOTE — ED Provider Notes (Signed)
  [place] EMERGENCY DEPARTMENT AT [facility] Provider Note   CSN: [id number] Arrival date & time: 06/01/23  2336     History {Add pertinent medical, surgical, social history, OB history to HPI:1} Chief Complaint  Patient presents with   Alcohol Intoxication    Mark Hammond is a 32 y.o. male.  Patient states that he has been drinking a lot.  He slept in the hospital for approximately 8 hours.  Now is awake.  Has a couple staples in his head that will be removed.  He has no other complaints   Alcohol Intoxication       Home Medications Prior to Admission medications   Medication Sig Start Date End Date Taking? Authorizing Provider  ibuprofen (ADVIL) 200 MG tablet Take 200 mg by mouth as needed for moderate pain or headache.    [provider]  potassium chloride SA (KLOR-CON M) 20 MEQ tablet Take 1 tablet (20 mEq total) by mouth daily for 7 days. 05/12/23 05/19/23  Arabella Merles, PA-C      Allergies    Codeine    Review of Systems   Review of Systems  Physical Exam Updated Vital Signs BP 116/71 (BP Location: Left Arm)   Pulse 72   Resp 16   SpO2 96%  Physical Exam  ED Results / Procedures / Treatments   Labs (all labs ordered are listed, but only abnormal results are displayed) Labs Reviewed - No data to display  EKG None  Radiology No results found.  Procedures Procedures  {Document cardiac monitor, telemetry assessment procedure when appropriate:1}  Medications Ordered in ED Medications - No data to display  ED Course/ Medical Decision Making/ A&P   {   Click here for ABCD2, HEART and other calculatorsREFRESH Note before signing :1}                              Medical Decision Making  Alcohol abuse and healed laceration to scalp.  Patient will be discharged home and will follow-up as needed  {Document critical care time when appropriate:1} {Document review of labs and clinical decision tools ie heart score, Chads2Vasc2  etc:1}  {Document your independent review of radiology images, and any outside records:1} {Document your discussion with family members, caretakers, and with consultants:1} {Document social determinants of health affecting pt's care:1} {Document your decision making why or why not admission, treatments were needed:1} Final Clinical Impression(s) / ED Diagnoses Final diagnoses:  Alcoholic intoxication without complication (HCC)    Rx / DC Orders ED Discharge Orders     None

## 2023-06-02 NOTE — ED Notes (Signed)
Ambulatory to restroom

## 2023-06-03 ENCOUNTER — Other Ambulatory Visit: Payer: Self-pay

## 2023-06-03 ENCOUNTER — Ambulatory Visit (HOSPITAL_COMMUNITY)
Admission: EM | Admit: 2023-06-03 | Discharge: 2023-06-05 | Disposition: A | Discharge status: Home / Self Care | Payer: 59 | Attending: Psychiatry | Admitting: Psychiatry

## 2023-06-03 DIAGNOSIS — Z5901 Sheltered homelessness: Secondary | ICD-10-CM | POA: Diagnosis not present

## 2023-06-03 DIAGNOSIS — F1994 Other psychoactive substance use, unspecified with psychoactive substance-induced mood disorder: Secondary | ICD-10-CM | POA: Diagnosis not present

## 2023-06-03 DIAGNOSIS — T43506A Underdosing of unspecified antipsychotics and neuroleptics, initial encounter: Secondary | ICD-10-CM | POA: Diagnosis not present

## 2023-06-03 DIAGNOSIS — Z91128 Patient's intentional underdosing of medication regimen for other reason: Secondary | ICD-10-CM | POA: Diagnosis not present

## 2023-06-03 DIAGNOSIS — R45851 Suicidal ideations: Secondary | ICD-10-CM | POA: Insufficient documentation

## 2023-06-03 DIAGNOSIS — Y908 Blood alcohol level of 240 mg/100 ml or more: Secondary | ICD-10-CM | POA: Insufficient documentation

## 2023-06-03 DIAGNOSIS — F17210 Nicotine dependence, cigarettes, uncomplicated: Secondary | ICD-10-CM | POA: Insufficient documentation

## 2023-06-03 DIAGNOSIS — F1490 Cocaine use, unspecified, uncomplicated: Secondary | ICD-10-CM | POA: Insufficient documentation

## 2023-06-03 DIAGNOSIS — F259 Schizoaffective disorder, unspecified: Secondary | ICD-10-CM | POA: Insufficient documentation

## 2023-06-03 DIAGNOSIS — F1024 Alcohol dependence with alcohol-induced mood disorder: Secondary | ICD-10-CM | POA: Insufficient documentation

## 2023-06-03 DIAGNOSIS — F1190 Opioid use, unspecified, uncomplicated: Secondary | ICD-10-CM | POA: Diagnosis not present

## 2023-06-03 DIAGNOSIS — Z9152 Personal history of nonsuicidal self-harm: Secondary | ICD-10-CM | POA: Insufficient documentation

## 2023-06-03 DIAGNOSIS — F1910 Other psychoactive substance abuse, uncomplicated: Secondary | ICD-10-CM | POA: Diagnosis not present

## 2023-06-03 DIAGNOSIS — R442 Other hallucinations: Secondary | ICD-10-CM | POA: Diagnosis not present

## 2023-06-03 LAB — CBC WITH DIFFERENTIAL/PLATELET
Abs Immature Granulocytes: 0.02 10*3/uL (ref 0.00–0.07)
Basophils Absolute: 0.1 10*3/uL (ref 0.0–0.1)
Basophils Relative: 2 %
Eosinophils Absolute: 0.1 10*3/uL (ref 0.0–0.5)
Eosinophils Relative: 2 %
HCT: 38 % — ABNORMAL LOW (ref 39.0–52.0)
Hemoglobin: 13.3 g/dL (ref 13.0–17.0)
Immature Granulocytes: 0 %
Lymphocytes Relative: 40 %
Lymphs Abs: 2.6 10*3/uL (ref 0.7–4.0)
MCH: 34.3 pg — ABNORMAL HIGH (ref 26.0–34.0)
MCHC: 35 g/dL (ref 30.0–36.0)
MCV: 97.9 fL (ref 80.0–100.0)
Monocytes Absolute: 0.4 10*3/uL (ref 0.1–1.0)
Monocytes Relative: 7 %
Neutro Abs: 3.2 10*3/uL (ref 1.7–7.7)
Neutrophils Relative %: 49 %
Platelets: 233 10*3/uL (ref 150–400)
RBC: 3.88 MIL/uL — ABNORMAL LOW (ref 4.22–5.81)
RDW: 13 % (ref 11.5–15.5)
WBC: 6.4 10*3/uL (ref 4.0–10.5)
nRBC: 0 % (ref 0.0–0.2)

## 2023-06-03 LAB — COMPREHENSIVE METABOLIC PANEL
ALT: 186 U/L — ABNORMAL HIGH (ref 0–44)
AST: 258 U/L — ABNORMAL HIGH (ref 15–41)
Albumin: 3.2 g/dL — ABNORMAL LOW (ref 3.5–5.0)
Alkaline Phosphatase: 107 U/L (ref 38–126)
Anion gap: 10 (ref 5–15)
BUN: 5 mg/dL — ABNORMAL LOW (ref 6–20)
CO2: 26 mmol/L (ref 22–32)
Calcium: 8.3 mg/dL — ABNORMAL LOW (ref 8.9–10.3)
Chloride: 104 mmol/L (ref 98–111)
Creatinine, Ser: 1.04 mg/dL (ref 0.61–1.24)
GFR, Estimated: >60 mL/min (ref >60)
Glucose, Bld: 112 mg/dL — ABNORMAL HIGH (ref 70–99)
Potassium: 3.4 mmol/L — ABNORMAL LOW (ref 3.5–5.1)
Sodium: 140 mmol/L (ref 135–145)
Total Bilirubin: 0.8 mg/dL (ref 0.3–1.2)
Total Protein: 7 g/dL (ref 6.5–8.1)

## 2023-06-03 LAB — POCT URINE DRUG SCREEN - MANUAL ENTRY (I-SCREEN)
POC Amphetamine UR: NOT DETECTED
POC Buprenorphine (BUP): NOT DETECTED
POC Cocaine UR: POSITIVE — AB
POC Marijuana UR: POSITIVE — AB
POC Methadone UR: NOT DETECTED
POC Methamphetamine UR: NOT DETECTED
POC Morphine: POSITIVE — AB
POC Oxazepam (BZO): NOT DETECTED
POC Oxycodone UR: NOT DETECTED
POC Secobarbital (BAR): NOT DETECTED

## 2023-06-03 LAB — ETHANOL: Alcohol, Ethyl (B): 285 mg/dL — ABNORMAL HIGH (ref <10)

## 2023-06-03 MED ORDER — LORAZEPAM 1 MG PO TABS: 1.0000 mg | ORAL_TABLET | Freq: Four times a day (QID) | ORAL | Status: DC | PRN

## 2023-06-03 MED ORDER — MAGNESIUM HYDROXIDE 400 MG/5ML PO SUSP: 30.0000 mL | Freq: Every day | ORAL | Status: DC | PRN

## 2023-06-03 MED ORDER — LOPERAMIDE HCL 2 MG PO CAPS: 2.0000 mg | ORAL_CAPSULE | ORAL | Status: DC | PRN

## 2023-06-03 MED ORDER — NAPROXEN 500 MG PO TABS: 500.0000 mg | ORAL_TABLET | Freq: Two times a day (BID) | ORAL | Status: DC | PRN

## 2023-06-03 MED ORDER — DICYCLOMINE HCL 20 MG PO TABS: 20.0000 mg | ORAL_TABLET | Freq: Four times a day (QID) | ORAL | Status: DC | PRN

## 2023-06-03 MED ORDER — THIAMINE HCL 100 MG/ML IJ SOLN
100.0000 mg | Freq: Once | INTRAMUSCULAR | Status: AC
  Administered 2023-06-03: 100 mg via INTRAMUSCULAR
  Filled 2023-06-03: qty 2

## 2023-06-03 MED ORDER — ONDANSETRON 4 MG PO TBDP: 4.0000 mg | ORAL_TABLET | Freq: Four times a day (QID) | ORAL | Status: DC | PRN

## 2023-06-03 MED ORDER — ADULT MULTIVITAMIN W/MINERALS CH
1.0000 | ORAL_TABLET | Freq: Every day | ORAL | Status: DC
  Administered 2023-06-03 – 2023-06-05 (×3): 1 via ORAL
  Filled 2023-06-03 (×3): qty 1

## 2023-06-03 MED ORDER — HYDROXYZINE HCL 25 MG PO TABS
25.0000 mg | ORAL_TABLET | Freq: Four times a day (QID) | ORAL | Status: DC | PRN
  Administered 2023-06-03 – 2023-06-04 (×2): 25 mg via ORAL
  Filled 2023-06-03 (×2): qty 1

## 2023-06-03 MED ORDER — IBUPROFEN 600 MG PO TABS: 600.0000 mg | ORAL_TABLET | Freq: Four times a day (QID) | ORAL | Status: DC | PRN

## 2023-06-03 MED ORDER — OLANZAPINE 5 MG PO TBDP
5.0000 mg | ORAL_TABLET | Freq: Every day | ORAL | Status: DC
  Administered 2023-06-03: 5 mg via ORAL
  Filled 2023-06-03: qty 1

## 2023-06-03 MED ORDER — THIAMINE MONONITRATE 100 MG PO TABS
100.0000 mg | ORAL_TABLET | Freq: Every day | ORAL | Status: DC
  Administered 2023-06-04 – 2023-06-05 (×2): 100 mg via ORAL
  Filled 2023-06-03 (×2): qty 1

## 2023-06-03 MED ORDER — TRAZODONE HCL 50 MG PO TABS
50.0000 mg | ORAL_TABLET | Freq: Every evening | ORAL | Status: DC | PRN
  Administered 2023-06-03 – 2023-06-04 (×2): 50 mg via ORAL
  Filled 2023-06-03 (×2): qty 1

## 2023-06-03 MED ORDER — ALUM & MAG HYDROXIDE-SIMETH 200-200-20 MG/5ML PO SUSP: 30.0000 mL | ORAL | Status: DC | PRN

## 2023-06-03 MED ORDER — OLANZAPINE 5 MG PO TBDP
5.0000 mg | ORAL_TABLET | Freq: Two times a day (BID) | ORAL | Status: DC
  Administered 2023-06-03 – 2023-06-05 (×4): 5 mg via ORAL
  Filled 2023-06-03 (×3): qty 1

## 2023-06-03 MED ORDER — HYDROXYZINE HCL 25 MG PO TABS: 25.0000 mg | ORAL_TABLET | Freq: Four times a day (QID) | ORAL | Status: DC | PRN

## 2023-06-03 MED ORDER — HYDROXYZINE HCL 25 MG PO TABS: 25.0000 mg | ORAL_TABLET | Freq: Three times a day (TID) | ORAL | Status: DC | PRN

## 2023-06-03 MED ORDER — METHOCARBAMOL 500 MG PO TABS
500.0000 mg | ORAL_TABLET | Freq: Three times a day (TID) | ORAL | Status: DC | PRN
  Administered 2023-06-03: 500 mg via ORAL
  Filled 2023-06-03: qty 1

## 2023-06-03 MED ORDER — ACETAMINOPHEN 325 MG PO TABS
650.0000 mg | ORAL_TABLET | Freq: Four times a day (QID) | ORAL | Status: DC | PRN
  Administered 2023-06-03: 650 mg via ORAL
  Filled 2023-06-03: qty 2

## 2023-06-03 NOTE — ED Notes (Signed)
Pt was given dinner and 4 juice, he needs to drink some water
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
## 2023-06-03 NOTE — ED Notes (Signed)
Patient  sleeping in no acute stress. RR even and unlabored .Environment secured .Will continue to monitor for safely. 
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
## 2023-06-03 NOTE — Progress Notes (Signed)
LCSW Progress Note  [id number]   Mark Hammond  06/03/2023  4:40 PM    Inpatient Behavioral Health Placement  Pt meets inpatient criteria per Vernard Gambles, NP. There are no available beds within [facility] system per [facility] Antoinette Cillo, RN. Referral was sent to the following facilities;   Destination  Service Provider Address Phone [facility]  [address] [place], [place] [address] [phone]  [facility]  [address], [place] [address] [phone]  [facility] [address]  [place] [address] [phone]  [facility]  [facility] [place]egino Bellow [place] [address] [phone]  [facility]  [address] [place] [address] [phone]  [facility] [address]  [address] [place] [address] [phone]  [facility]  [address] [place], [place] [address] [phone]  [facility]  [address], [place] [address] [phone]  [facility]  [address] [place] [address] [phone]  [facility]  [address], [place] [address] [phone]  [facility]  [address] [place] [address] [phone]  [facility] EFAX  [address] [name] [place] [phone]  [facility]  [address], [place] [address] [phone]  [facility]  [address], [place] [address] [phone]  [facility]  [address], [place] [address] [phone]  [facility]  [address] [place] [address] [phone]  [facility]  [address], [place] [address] [phone]  [facility]  [address] [place], [place] [address] [phone]  [facility]  [address], [place] [address] [phone]  [facility] [place]  [place] [phone]  [facility]  [address] [place] [address] [phone]  [facility]  [address] [place] [address] [phone]  [facility]  [address] [place], [place] [address] [phone]  [facility]  [address] [place] [address] [phone]  [facility]  [address] [place] [address] [phone]  [facility]  [address] [place] [address] [phone]  [facility]  [address], [place] [address] [phone]  [facility]  [place] [phone]  [facility]  [address] [place] [address] [phone]    Situation ongoing,  CSW will follow up.    Maryjean Ka, MSW, [facility] 06/03/2023 4:40 PM

## 2023-06-03 NOTE — ED Provider Notes (Signed)
Behavioral Health Progress Note  Date and Time: 06/03/2023 5:19 PM Name: Mark Hammond MRN:  [id number]  Subjective:  ***  Diagnosis:  Final diagnoses:  None    Total Time spent with patient: {Time; 15 min - 8 hours:17441}  Past Psychiatric History: *** Past Medical History: *** Family History: *** Family Psychiatric  History: *** Social History: ***  Additional Social History:    Pain Medications: None Prescriptions: None Over the Counter: Tylenol, benedryl History of alcohol / drug use?: Yes Longest period of sobriety (when/how long): Unable to Quantify Negative Consequences of Use: Financial, Legal, Personal relationships, Work / School Withdrawal Symptoms: Irritability Name of Substance 1: Alcohol 1 - Age of First Use: unknown 1 - Amount (size/oz): "a beer" 1 - Frequency: daily 1 - Last Use / Amount: "last night" - 06/02/23 Name of Substance 2: cocaine 2 - Age of First Use: unknown 2 - Amount (size/oz): unknown 2 - Frequency: 2-3 times a week 2 - Last Use / Amount: 06/02/23                Sleep: {BHH GOOD/FAIR/POOR:22877}  Appetite:  {BHH GOOD/FAIR/POOR:22877}  Current Medications:  Current Facility-Administered Medications  Medication Dose Route Frequency Provider Last Rate Last Admin   acetaminophen (TYLENOL) tablet 650 mg  650 mg Oral Q6H PRN Ajibola, Ene A, NP       alum & mag hydroxide-simeth (MAALOX/MYLANTA) 200-200-20 MG/5ML suspension 30 mL  30 mL Oral Q4H PRN Ajibola, Ene A, NP       dicyclomine (BENTYL) tablet 20 mg  20 mg Oral Q6H PRN Ajibola, Ene A, NP       hydrOXYzine (ATARAX) tablet 25 mg  25 mg Oral Q6H PRN Ardis Hughs, NP       loperamide (IMODIUM) capsule 2-4 mg  2-4 mg Oral PRN Ajibola, Ene A, NP       LORazepam (ATIVAN) tablet 1 mg  1 mg Oral Q6H PRN Ardis Hughs, NP       magnesium hydroxide (MILK OF MAGNESIA) suspension 30 mL  30 mL Oral Daily PRN Ajibola, Ene A, NP       methocarbamol (ROBAXIN) tablet 500 mg  500 mg Oral  Q8H PRN Ajibola, Ene A, NP       multivitamin with minerals tablet 1 tablet  1 tablet Oral Daily Ardis Hughs, NP   1 tablet at 06/03/23 1002   naproxen (NAPROSYN) tablet 500 mg  500 mg Oral BID PRN Ajibola, Ene A, NP       OLANZapine zydis (ZYPREXA) disintegrating tablet 5 mg  5 mg Oral BID Ardis Hughs, NP       ondansetron (ZOFRAN-ODT) disintegrating tablet 4 mg  4 mg Oral Q6H PRN Ajibola, Ene A, NP       [START ON 06/04/2023] thiamine (VITAMIN B1) tablet 100 mg  100 mg Oral Daily Vernard Gambles H, NP       traZODone (DESYREL) tablet 50 mg  50 mg Oral QHS PRN Ajibola, Ene A, NP       No current outpatient medications on file.    Labs  Lab Results:  Admission on 06/03/2023  Component Date Value Ref Range Status   WBC 06/03/2023 6.4  4.0 - 10.5 K/uL Final   RBC 06/03/2023 3.88 (L)  4.22 - 5.81 MIL/uL Final   Hemoglobin 06/03/2023 13.3  13.0 - 17.0 g/dL Final   HCT 60/63/0160 38.0 (L)  39.0 - 52.0 % Final   MCV 06/03/2023 97.9  80.0 - 100.0 fL Final   MCH 06/03/2023 34.3 (H)  26.0 - 34.0 pg Final   MCHC 06/03/2023 35.0  30.0 - 36.0 g/dL Final   RDW 40/98/1191 13.0  11.5 - 15.5 % Final   Platelets 06/03/2023 233  150 - 400 K/uL Final   nRBC 06/03/2023 0.0  0.0 - 0.2 % Final   Neutrophils Relative % 06/03/2023 49  % Final   Neutro Abs 06/03/2023 3.2  1.7 - 7.7 K/uL Final   Lymphocytes Relative 06/03/2023 40  % Final   Lymphs Abs 06/03/2023 2.6  0.7 - 4.0 K/uL Final   Monocytes Relative 06/03/2023 7  % Final   Monocytes Absolute 06/03/2023 0.4  0.1 - 1.0 K/uL Final   Eosinophils Relative 06/03/2023 2  % Final   Eosinophils Absolute 06/03/2023 0.1  0.0 - 0.5 K/uL Final   Basophils Relative 06/03/2023 2  % Final   Basophils Absolute 06/03/2023 0.1  0.0 - 0.1 K/uL Final   Immature Granulocytes 06/03/2023 0  % Final   Abs Immature Granulocytes 06/03/2023 0.02  0.00 - 0.07 K/uL Final   Performed at [facility], [address] [place], [place] [address]   Sodium  06/03/2023 140  135 - 145 mmol/L Final   Potassium 06/03/2023 3.4 (L)  3.5 - 5.1 mmol/L Final   Chloride 06/03/2023 104  98 - 111 mmol/L Final   CO2 06/03/2023 26  22 - 32 mmol/L Final   Glucose, Bld 06/03/2023 112 (H)  70 - 99 mg/dL Final   Glucose reference range applies only to samples taken after fasting for at least 8 hours.   BUN 06/03/2023 5 (L)  6 - 20 mg/dL Final   Creatinine, Ser 06/03/2023 1.04  0.61 - 1.24 mg/dL Final   Calcium 56/21/3086 8.3 (L)  8.9 - 10.3 mg/dL Final   Total Protein 57/84/6962 7.0  6.5 - 8.1 g/dL Final   Albumin 95/28/4132 3.2 (L)  3.5 - 5.0 g/dL Final   AST 44/10/270 258 (H)  15 - 41 U/L Final   ALT 06/03/2023 186 (H)  0 - 44 U/L Final   Alkaline Phosphatase 06/03/2023 107  38 - 126 U/L Final   Total Bilirubin 06/03/2023 0.8  0.3 - 1.2 mg/dL Final   GFR, Estimated 06/03/2023 >60  >60 mL/min Final   Comment: (NOTE) Calculated using the CKD-EPI Creatinine Equation (2021)    Anion gap 06/03/2023 10  5 - 15 Final   Performed at [facility], [address] [place], [place] [address]   Alcohol, Ethyl (B) 06/03/2023 285 (H)  <10 mg/dL Final   Comment: (NOTE) Lowest detectable limit for serum alcohol is 10 mg/dL.  For medical purposes only. Performed at [facility], [address] [place], [place] [address]    POC Amphetamine UR 06/03/2023 None Detected  NONE DETECTED (Cut Off Level 1000 ng/mL) Final   POC Secobarbital (BAR) 06/03/2023 None Detected  NONE DETECTED (Cut Off Level 300 ng/mL) Final   POC Buprenorphine (BUP) 06/03/2023 None Detected  NONE DETECTED (Cut Off Level 10 ng/mL) Final   POC Oxazepam (BZO) 06/03/2023 None Detected  NONE DETECTED (Cut Off Level 300 ng/mL) Final   POC Cocaine UR 06/03/2023 Positive (A)  NONE DETECTED (Cut Off Level 300 ng/mL) Final   POC Methamphetamine UR 06/03/2023 None Detected  NONE DETECTED (Cut Off Level 1000 ng/mL) Final   POC Morphine 06/03/2023 Positive (A)  NONE DETECTED (Cut Off Level 300  ng/mL) Final  POC Methadone UR 06/03/2023 None Detected  NONE DETECTED (Cut Off Level 300 ng/mL) Final   POC Oxycodone UR 06/03/2023 None Detected  NONE DETECTED (Cut Off Level 100 ng/mL) Final   POC Marijuana UR 06/03/2023 Positive (A)  NONE DETECTED (Cut Off Level 50 ng/mL) Final  Admission on 05/28/2023, Discharged on 05/28/2023  Component Date Value Ref Range Status   WBC 05/28/2023 11.2 (H)  4.0 - 10.5 K/uL Final   RBC 05/28/2023 4.18 (L)  4.22 - 5.81 MIL/uL Final   Hemoglobin 05/28/2023 14.3  13.0 - 17.0 g/dL Final   HCT 40/98/1191 41.5  39.0 - 52.0 % Final   MCV 05/28/2023 99.3  80.0 - 100.0 fL Final   MCH 05/28/2023 34.2 (H)  26.0 - 34.0 pg Final   MCHC 05/28/2023 34.5  30.0 - 36.0 g/dL Final   RDW 47/82/9562 13.2  11.5 - 15.5 % Final   Platelets 05/28/2023 244  150 - 400 K/uL Final   nRBC 05/28/2023 0.0  0.0 - 0.2 % Final   Neutrophils Relative % 05/28/2023 65  % Final   Neutro Abs 05/28/2023 7.3  1.7 - 7.7 K/uL Final   Lymphocytes Relative 05/28/2023 27  % Final   Lymphs Abs 05/28/2023 3.0  0.7 - 4.0 K/uL Final   Monocytes Relative 05/28/2023 6  % Final   Monocytes Absolute 05/28/2023 0.7  0.1 - 1.0 K/uL Final   Eosinophils Relative 05/28/2023 1  % Final   Eosinophils Absolute 05/28/2023 0.1  0.0 - 0.5 K/uL Final   Basophils Relative 05/28/2023 1  % Final   Basophils Absolute 05/28/2023 0.1  0.0 - 0.1 K/uL Final   Immature Granulocytes 05/28/2023 0  % Final   Abs Immature Granulocytes 05/28/2023 0.04  0.00 - 0.07 K/uL Final   Performed at [facility], [address], [place], [place] [address]   Sodium 05/28/2023 139  135 - 145 mmol/L Final   Potassium 05/28/2023 3.1 (L)  3.5 - 5.1 mmol/L Final   Chloride 05/28/2023 106  98 - 111 mmol/L Final   CO2 05/28/2023 22  22 - 32 mmol/L Final   Glucose, Bld 05/28/2023 100 (H)  70 - 99 mg/dL Final   Glucose reference range applies only to samples taken after fasting for at least 8 hours.   BUN 05/28/2023 6  6 - 20 mg/dL  Final   Creatinine, Ser 05/28/2023 0.70  0.61 - 1.24 mg/dL Final   Calcium 57/84/6962 8.0 (L)  8.9 - 10.3 mg/dL Final   GFR, Estimated 05/28/2023 >60  >60 mL/min Final   Comment: (NOTE) Calculated using the CKD-EPI Creatinine Equation (2021)    Anion gap 05/28/2023 11  5 - 15 Final   Performed at [facility], [address], [place], [place] [address]   Alcohol, Ethyl (B) 05/28/2023 279 (H)  <10 mg/dL Final   Comment: (NOTE) Lowest detectable limit for serum alcohol is 10 mg/dL.  For medical purposes only. Performed at [facility], [address] [place], [place] [address]    Opiates 05/28/2023 POSITIVE (A)  NONE DETECTED Final   Cocaine 05/28/2023 POSITIVE (A)  NONE DETECTED Final   Benzodiazepines 05/28/2023 NONE DETECTED  NONE DETECTED Final   Amphetamines 05/28/2023 POSITIVE (A)  NONE DETECTED Final   Tetrahydrocannabinol 05/28/2023 POSITIVE (A)  NONE DETECTED Final   Barbiturates 05/28/2023 NONE DETECTED  NONE DETECTED Final   Comment: (NOTE) DRUG SCREEN FOR MEDICAL PURPOSES ONLY.  IF CONFIRMATION IS NEEDED FOR ANY PURPOSE,  NOTIFY LAB WITHIN 5 DAYS.  LOWEST DETECTABLE LIMITS FOR URINE DRUG SCREEN Drug Class                     Cutoff (ng/mL) Amphetamine and metabolites    1000 Barbiturate and metabolites    200 Benzodiazepine                 200 Opiates and metabolites        300 Cocaine and metabolites        300 THC                            50 Performed at [facility], [address], [place], [place] [address]   Admission on 05/12/2023, Discharged on 05/12/2023  Component Date Value Ref Range Status   Sodium 05/12/2023 133 (L)  135 - 145 mmol/L Final   Potassium 05/12/2023 3.1 (L)  3.5 - 5.1 mmol/L Final   Chloride 05/12/2023 97 (L)  98 - 111 mmol/L Final   CO2 05/12/2023 21 (L)  22 - 32 mmol/L Final   Glucose, Bld 05/12/2023 101 (H)  70 - 99 mg/dL Final   Glucose reference range applies only to samples taken after fasting for at least 8  hours.   BUN 05/12/2023 7  6 - 20 mg/dL Final   Creatinine, Ser 05/12/2023 0.61  0.61 - 1.24 mg/dL Final   Calcium 60/45/4098 8.7 (L)  8.9 - 10.3 mg/dL Final   Total Protein 11/91/4782 8.1  6.5 - 8.1 g/dL Final   Albumin 95/62/1308 3.6  3.5 - 5.0 g/dL Final   AST 65/78/4696 566 (H)  15 - 41 U/L Final   ALT 05/12/2023 436 (H)  0 - 44 U/L Final   Alkaline Phosphatase 05/12/2023 127 (H)  38 - 126 U/L Final   Total Bilirubin 05/12/2023 1.3 (H)  0.3 - 1.2 mg/dL Final   GFR, Estimated 05/12/2023 >60  >60 mL/min Final   Comment: (NOTE) Calculated using the CKD-EPI Creatinine Equation (2021)    Anion gap 05/12/2023 15  5 - 15 Final   Performed at [facility], [address], [place], [place] [address]   Alcohol, Ethyl (B) 05/12/2023 <10  <10 mg/dL Final   Comment: (NOTE) Lowest detectable limit for serum alcohol is 10 mg/dL.  For medical purposes only. Performed at [facility], [address] [place], [place] [address]    Opiates 05/12/2023 NONE DETECTED  NONE DETECTED Final   Cocaine 05/12/2023 POSITIVE (A)  NONE DETECTED Final   Benzodiazepines 05/12/2023 NONE DETECTED  NONE DETECTED Final   Amphetamines 05/12/2023 POSITIVE (A)  NONE DETECTED Final   Tetrahydrocannabinol 05/12/2023 NONE DETECTED  NONE DETECTED Final   Barbiturates 05/12/2023 NONE DETECTED  NONE DETECTED Final   Comment: (NOTE) DRUG SCREEN FOR MEDICAL PURPOSES ONLY.  IF CONFIRMATION IS NEEDED FOR ANY PURPOSE, NOTIFY LAB WITHIN 5 DAYS.  LOWEST DETECTABLE LIMITS FOR URINE DRUG SCREEN Drug Class                     Cutoff (ng/mL) Amphetamine and metabolites    1000 Barbiturate and metabolites    200 Benzodiazepine                 200 Opiates and metabolites        300 Cocaine and metabolites        300 THC  50 Performed at [facility], [address], [place], [place] [address]    WBC 05/12/2023 6.8  4.0 - 10.5 K/uL Final   RBC 05/12/2023 4.77  4.22 - 5.81 MIL/uL  Final   Hemoglobin 05/12/2023 15.7  13.0 - 17.0 g/dL Final   HCT 60/45/4098 45.3  39.0 - 52.0 % Final   MCV 05/12/2023 95.0  80.0 - 100.0 fL Final   MCH 05/12/2023 32.9  26.0 - 34.0 pg Final   MCHC 05/12/2023 34.7  30.0 - 36.0 g/dL Final   RDW 11/91/4782 13.0  11.5 - 15.5 % Final   Platelets 05/12/2023 194  150 - 400 K/uL Final   nRBC 05/12/2023 0.0  0.0 - 0.2 % Final   Neutrophils Relative % 05/12/2023 73  % Final   Neutro Abs 05/12/2023 5.0  1.7 - 7.7 K/uL Final   Lymphocytes Relative 05/12/2023 17  % Final   Lymphs Abs 05/12/2023 1.1  0.7 - 4.0 K/uL Final   Monocytes Relative 05/12/2023 8  % Final   Monocytes Absolute 05/12/2023 0.5  0.1 - 1.0 K/uL Final   Eosinophils Relative 05/12/2023 1  % Final   Eosinophils Absolute 05/12/2023 0.1  0.0 - 0.5 K/uL Final   Basophils Relative 05/12/2023 1  % Final   Basophils Absolute 05/12/2023 0.1  0.0 - 0.1 K/uL Final   Immature Granulocytes 05/12/2023 0  % Final   Abs Immature Granulocytes 05/12/2023 0.03  0.00 - 0.07 K/uL Final   Performed at [facility], [address] [place], [place] [address]   SARS Coronavirus 2 by RT PCR 05/12/2023 NEGATIVE  NEGATIVE Final   Performed at [facility], [address], [place], [place] [address]   Total CK 05/12/2023 438 (H)  49 - 397 U/L Final   Performed at [facility], [address] [place], [place] [address]  Admission on 03/05/2023, Discharged on 03/05/2023  Component Date Value Ref Range Status   Sodium 03/05/2023 137  135 - 145 mmol/L Final   Potassium 03/05/2023 3.2 (L)  3.5 - 5.1 mmol/L Final   Chloride 03/05/2023 102  98 - 111 mmol/L Final   CO2 03/05/2023 24  22 - 32 mmol/L Final   Glucose, Bld 03/05/2023 209 (H)  70 - 99 mg/dL Final   Glucose reference range applies only to samples taken after fasting for at least 8 hours.   BUN 03/05/2023 10  6 - 20 mg/dL Final   Creatinine, Ser 03/05/2023 0.78  0.61 - 1.24 mg/dL Final   Calcium 62/95/2841 8.1 (L)  8.9 - 10.3 mg/dL  Final   Total Protein 03/05/2023 7.0  6.5 - 8.1 g/dL Final   Albumin 32/44/0102 3.1 (L)  3.5 - 5.0 g/dL Final   AST 72/53/6644 287 (H)  15 - 41 U/L Final   ALT 03/05/2023 261 (H)  0 - 44 U/L Final   Alkaline Phosphatase 03/05/2023 140 (H)  38 - 126 U/L Final   Total Bilirubin 03/05/2023 0.7  0.3 - 1.2 mg/dL Final   GFR, Estimated 03/05/2023 >60  >60 mL/min Final   Comment: (NOTE) Calculated using the CKD-EPI Creatinine Equation (2021)    Anion gap 03/05/2023 11  5 - 15 Final   Performed at [facility], [address], [place], [place] [address]   Alcohol, Ethyl (B) 03/05/2023 171 (H)  <10 mg/dL Final   Comment: (NOTE) Lowest detectable limit for serum alcohol is 10 mg/dL.  For medical purposes  only. Performed at [facility], [address], [place], [place] [address]    WBC 03/05/2023 11.4 (H)  4.0 - 10.5 K/uL Final   RBC 03/05/2023 4.20 (L)  4.22 - 5.81 MIL/uL Final   Hemoglobin 03/05/2023 13.9  13.0 - 17.0 g/dL Final   HCT 88/41/6606 40.4  39.0 - 52.0 % Final   MCV 03/05/2023 96.2  80.0 - 100.0 fL Final   MCH 03/05/2023 33.1  26.0 - 34.0 pg Final   MCHC 03/05/2023 34.4  30.0 - 36.0 g/dL Final   RDW 30/16/0109 13.8  11.5 - 15.5 % Final   Platelets 03/05/2023 392  150 - 400 K/uL Final   nRBC 03/05/2023 0.0  0.0 - 0.2 % Final   Performed at [facility], [address] [place], [place] [address]   Opiates 03/05/2023 NONE DETECTED  NONE DETECTED Final   Cocaine 03/05/2023 POSITIVE (A)  NONE DETECTED Final   Benzodiazepines 03/05/2023 POSITIVE (A)  NONE DETECTED Final   Amphetamines 03/05/2023 NONE DETECTED  NONE DETECTED Final   Tetrahydrocannabinol 03/05/2023 POSITIVE (A)  NONE DETECTED Final   Barbiturates 03/05/2023 NONE DETECTED  NONE DETECTED Final   Comment: (NOTE) DRUG SCREEN FOR MEDICAL PURPOSES ONLY.  IF CONFIRMATION IS NEEDED FOR ANY PURPOSE, NOTIFY LAB WITHIN 5 DAYS.  LOWEST DETECTABLE LIMITS FOR URINE DRUG SCREEN Drug Class                      Cutoff (ng/mL) Amphetamine and metabolites    1000 Barbiturate and metabolites    200 Benzodiazepine                 200 Opiates and metabolites        300 Cocaine and metabolites        300 THC                            50 Performed at [facility], [address] [place], [place] [address]   Admission on 02/23/2023, Discharged on 03/01/2023  Component Date Value Ref Range Status   Cholesterol 02/24/2023 147  0 - 200 mg/dL Final   Triglycerides 25/42/7062 96  <150 mg/dL Final   HDL 37/62/8315 42  >40 mg/dL Final   Total CHOL/HDL Ratio 02/24/2023 3.5  RATIO Final   VLDL 02/24/2023 19  0 - 40 mg/dL Final   LDL Cholesterol 02/24/2023 86  0 - 99 mg/dL Final   Comment:        Total Cholesterol/HDL:CHD Risk Coronary Heart Disease Risk Table                     Men   Women  1/2 Average Risk   3.4   3.3  Average Risk       5.0   4.4  2 X Average Risk   9.6   7.1  3 X Average Risk  23.4   11.0        Use the calculated Patient Ratio above and the CHD Risk Table to determine the patient's CHD Risk.        ATP III CLASSIFICATION (LDL):  <100     mg/dL   Optimal  176-160  mg/dL   Near or Above                    Optimal  130-159  mg/dL   Borderline  737-106  mg/dL   High  >562     mg/dL   Very High Performed at [facility], [address] [place], [place] [address]    Hgb A1c MFr Bld 02/24/2023 5.5  4.8 - 5.6 % Final   Comment: (NOTE)         Prediabetes: 5.7 - 6.4         Diabetes: >6.4         Glycemic control for adults with diabetes: <7.0    Mean Plasma Glucose 02/24/2023 111  mg/dL Final   Comment: (NOTE) Performed At: [facility] [address] [place], [place] [id number] Jolene Schimke MD [id number]    Hepatitis B Surface Ag 02/25/2023 NON REACTIVE  NON REACTIVE Final   HCV Ab 02/25/2023 Reactive (A)  NON REACTIVE Final   Comment: (NOTE) The CDC recommends that a Reactive HCV antibody result be followed up  with a HCV  Nucleic Acid Amplification test.     Hep A IgM 02/25/2023 NON REACTIVE  NON REACTIVE Final   Hep B C IgM 02/25/2023 NON REACTIVE  NON REACTIVE Final   Performed at [facility], [address], [place], [place] [address]   Potassium 02/25/2023 4.4  3.5 - 5.1 mmol/L Final   Performed at [facility], [address], [place], [place] [address]   HCV RNA, Quantitation 02/27/2023 See Final Results  IU/mL Final   Test Information: 02/27/2023 Comment   Final   Comment: (NOTE) The quantitative range of this assay is 15 IU/mL to 100 million IU/mL. Performed At: [facility] [address] [place], [place] [id number] Jolene Schimke MD LO:[id number]    Sodium 02/27/2023 138  135 - 145 mmol/L Final   Potassium 02/27/2023 4.1  3.5 - 5.1 mmol/L Final   Chloride 02/27/2023 102  98 - 111 mmol/L Final   CO2 02/27/2023 28  22 - 32 mmol/L Final   Glucose, Bld 02/27/2023 96  70 - 99 mg/dL Final   Glucose reference range applies only to samples taken after fasting for at least 8 hours.   BUN 02/27/2023 9  6 - 20 mg/dL Final   Creatinine, Ser 02/27/2023 0.75  0.61 - 1.24 mg/dL Final   Calcium 88/41/6606 8.4 (L)  8.9 - 10.3 mg/dL Final   Total Protein 30/16/0109 7.1  6.5 - 8.1 g/dL Final   Albumin 32/35/5732 2.9 (L)  3.5 - 5.0 g/dL Final   AST 20/25/4270 168 (H)  15 - 41 U/L Final   ALT 02/27/2023 193 (H)  0 - 44 U/L Final   Alkaline Phosphatase 02/27/2023 97  38 - 126 U/L Final   Total Bilirubin 02/27/2023 0.5  0.3 - 1.2 mg/dL Final   GFR, Estimated 02/27/2023 >60  >60 mL/min Final   Comment: (NOTE) Calculated using the CKD-EPI Creatinine Equation (2021)    Anion gap 02/27/2023 8  5 - 15 Final   Performed at [facility], [address], [place], [place] [address]   HCV RNA - Quantitation 02/27/2023 6,550,000  IU/mL Final   Comment: (NOTE)                HCV RNA detected HCV RNA viral loads >/= 25 IU/mL indicate current HCV infection.    HCV  RNA - log10 02/27/2023 6.816  log10 IU/mL Final   Comment: (NOTE) Performed At: [facility] [address] [place], [place] [id number] Jolene Schimke MD YW:[id number]   Admission on 02/23/2023, Discharged on 02/23/2023  Component Date Value Ref Range Status   Acetaminophen (Tylenol), Serum 02/23/2023 <10 (L)  10 - 30 ug/mL Final   Comment: (NOTE) Therapeutic concentrations vary significantly. A range of 10-30 ug/mL  may be an effective concentration for many patients. However, some  are best treated at concentrations outside of this range. Acetaminophen concentrations >150 ug/mL at 4 hours after ingestion  and >50 ug/mL at 12 hours after ingestion are often associated with  toxic reactions.  Performed at [facility], [address], [place], [place] [address]    Sodium 02/23/2023 138  135 - 145 mmol/L Final   Potassium 02/23/2023 3.1 (L)  3.5 - 5.1 mmol/L Final   Chloride 02/23/2023 103  98 - 111 mmol/L Final   CO2 02/23/2023 24  22 - 32 mmol/L Final   Glucose, Bld 02/23/2023 115 (H)  70 - 99 mg/dL Final   Glucose reference range applies only to samples taken after fasting for at least 8 hours.   BUN 02/23/2023 9  6 - 20 mg/dL Final   Creatinine, Ser 02/23/2023 0.75  0.61 - 1.24 mg/dL Final   Calcium 29/56/2130 8.2 (L)  8.9 - 10.3 mg/dL Final   Total Protein 86/57/8469 7.6  6.5 - 8.1 g/dL Final   Albumin 62/95/2841 3.3 (L)  3.5 - 5.0 g/dL Final   AST 32/44/0102 168 (H)  15 - 41 U/L Final   ALT 02/23/2023 138 (H)  0 - 44 U/L Final   Alkaline Phosphatase 02/23/2023 127 (H)  38 - 126 U/L Final   Total Bilirubin 02/23/2023 0.6  0.3 - 1.2 mg/dL Final   GFR, Estimated 02/23/2023 >60  >60 mL/min Final   Comment: (NOTE) Calculated using the CKD-EPI Creatinine Equation (2021)    Anion gap 02/23/2023 11  5 - 15 Final   Performed at [facility], [address] [place], [place] [address]   Alcohol, Ethyl (B) 02/23/2023 107 (H)  <10 mg/dL  Final   Comment: (NOTE) Lowest detectable limit for serum alcohol is 10 mg/dL.  For medical purposes only. Performed at [facility], [address] [place], [place] [address]    WBC 02/23/2023 9.3  4.0 - 10.5 K/uL Final   RBC 02/23/2023 4.19 (L)  4.22 - 5.81 MIL/uL Final   Hemoglobin 02/23/2023 13.9  13.0 - 17.0 g/dL Final   HCT 47/42/5956 40.5  39.0 - 52.0 % Final   MCV 02/23/2023 96.7  80.0 - 100.0 fL Final   MCH 02/23/2023 33.2  26.0 - 34.0 pg Final   MCHC 02/23/2023 34.3  30.0 - 36.0 g/dL Final   RDW 38/75/6433 14.3  11.5 - 15.5 % Final   Platelets 02/23/2023 227  150 - 400 K/uL Final   nRBC 02/23/2023 0.0  0.0 - 0.2 % Final   Neutrophils Relative % 02/23/2023 61  % Final   Neutro Abs 02/23/2023 5.6  1.7 - 7.7 K/uL Final   Lymphocytes Relative 02/23/2023 31  % Final   Lymphs Abs 02/23/2023 2.9  0.7 - 4.0 K/uL Final   Monocytes Relative 02/23/2023 6  % Final   Monocytes Absolute 02/23/2023 0.5  0.1 - 1.0 K/uL Final   Eosinophils Relative 02/23/2023 1  % Final   Eosinophils Absolute 02/23/2023 0.1  0.0 - 0.5 K/uL Final   Basophils Relative 02/23/2023 1  % Final   Basophils Absolute 02/23/2023 0.1  0.0 - 0.1 K/uL Final   Immature Granulocytes 02/23/2023 0  % Final   Abs Immature  Granulocytes 02/23/2023 0.03  0.00 - 0.07 K/uL Final   Performed at [facility], [address], [place], [place] [address]   Salicylate Lvl 02/23/2023 <7.0 (L)  7.0 - 30.0 mg/dL Final   Performed at [facility], [address], [place], [place] [address]   Opiates 02/23/2023 NONE DETECTED  NONE DETECTED Final   Cocaine 02/23/2023 POSITIVE (A)  NONE DETECTED Final   Benzodiazepines 02/23/2023 NONE DETECTED  NONE DETECTED Final   Amphetamines 02/23/2023 NONE DETECTED  NONE DETECTED Final   Tetrahydrocannabinol 02/23/2023 POSITIVE (A)  NONE DETECTED Final   Barbiturates 02/23/2023 NONE DETECTED  NONE DETECTED Final   Comment: (NOTE) DRUG  SCREEN FOR MEDICAL PURPOSES ONLY.  IF CONFIRMATION IS NEEDED FOR ANY PURPOSE, NOTIFY LAB WITHIN 5 DAYS.  LOWEST DETECTABLE LIMITS FOR URINE DRUG SCREEN Drug Class                     Cutoff (ng/mL) Amphetamine and metabolites    1000 Barbiturate and metabolites    200 Benzodiazepine                 200 Opiates and metabolites        300 Cocaine and metabolites        300 THC                            50 Performed at [facility], [address] [place], [place] [address]   Admission on 01/13/2023, Discharged on 01/23/2023  Component Date Value Ref Range Status   Hgb A1c MFr Bld 01/14/2023 5.7 (H)  4.8 - 5.6 % Final   Comment: (NOTE) Pre diabetes:          5.7%-6.4%  Diabetes:              >6.4%  Glycemic control for   <7.0% adults with diabetes    Mean Plasma Glucose 01/14/2023 116.89  mg/dL Final   Performed at [facility], [address] [place], [place] [address]   Cholesterol 01/14/2023 126  0 - 200 mg/dL Final   Triglycerides 96/29/5284 65  <150 mg/dL Final   HDL 13/24/4010 56  >40 mg/dL Final   Total CHOL/HDL Ratio 01/14/2023 2.3  RATIO Final   VLDL 01/14/2023 13  0 - 40 mg/dL Final   LDL Cholesterol 01/14/2023 57  0 - 99 mg/dL Final   Comment:        Total Cholesterol/HDL:CHD Risk Coronary Heart Disease Risk Table                     Men   Women  1/2 Average Risk   3.4   3.3  Average Risk       5.0   4.4  2 X Average Risk   9.6   7.1  3 X Average Risk  23.4   11.0        Use the calculated Patient Ratio above and the CHD Risk Table to determine the patient's CHD Risk.        ATP III CLASSIFICATION (LDL):  <100     mg/dL   Optimal  272-536  mg/dL   Near or Above                    Optimal  130-159  mg/dL   Borderline  644-034  mg/dL   High  >742  mg/dL   Very High Performed at [facility], [address] [place], [place] [address]    TSH 01/14/2023 1.134  0.350 - 4.500 uIU/mL Final   Comment:  Performed by a 3rd Generation assay with a functional sensitivity of <=0.01 uIU/mL. Performed at [facility], [address], [place], [place] [address]    Vit D, 25-Hydroxy 01/15/2023 26.86 (L)  30 - 100 ng/mL Final   Comment: (NOTE) Vitamin D deficiency has been defined by the Institute of Medicine  and an Endocrine Society practice guideline as a level of serum 25-OH  vitamin D less than 20 ng/mL (1,2). The Endocrine Society went on to  further define vitamin D insufficiency as a level between 21 and 29  ng/mL (2).  1. IOM (Institute of Medicine). 2010. Dietary reference intakes for  calcium and D. [place]: [employer]. 2. Holick MF, Binkley [place], Bischoff-Ferrari HA, et al. Evaluation,  treatment, and prevention of vitamin D deficiency: an Endocrine  Society clinical practice guideline, JCEM. 2011 Jul; 96(7): 1911-30.  Performed at [facility], [address] [place], [place] [address]    Sodium 01/15/2023 139  135 - 145 mmol/L Final   Potassium 01/15/2023 3.7  3.5 - 5.1 mmol/L Final   Chloride 01/15/2023 105  98 - 111 mmol/L Final   CO2 01/15/2023 26  22 - 32 mmol/L Final   Glucose, Bld 01/15/2023 126 (H)  70 - 99 mg/dL Final   Glucose reference range applies only to samples taken after fasting for at least 8 hours.   BUN 01/15/2023 10  6 - 20 mg/dL Final   Creatinine, Ser 01/15/2023 0.62  0.61 - 1.24 mg/dL Final   Calcium 91/47/8295 8.9  8.9 - 10.3 mg/dL Final   Total Protein 62/13/0865 7.8  6.5 - 8.1 g/dL Final   Albumin 78/46/9629 3.7  3.5 - 5.0 g/dL Final   AST 52/84/1324 288 (H)  15 - 41 U/L Final   ALT 01/15/2023 211 (H)  0 - 44 U/L Final   Alkaline Phosphatase 01/15/2023 115  38 - 126 U/L Final   Total Bilirubin 01/15/2023 0.8  0.3 - 1.2 mg/dL Final   GFR, Estimated 01/15/2023 >60  >60 mL/min Final   Comment: (NOTE) Calculated using the CKD-EPI Creatinine Equation (2021)    Anion gap 01/15/2023 8  5 - 15 Final    Performed at [facility], [address] [place], [place] [address]  Admission on 01/13/2023, Discharged on 01/13/2023  Component Date Value Ref Range Status   WBC 01/13/2023 8.7  4.0 - 10.5 K/uL Final   RBC 01/13/2023 4.79  4.22 - 5.81 MIL/uL Final   Hemoglobin 01/13/2023 15.7  13.0 - 17.0 g/dL Final   HCT 72/53/6644 44.0  39.0 - 52.0 % Final   MCV 01/13/2023 91.9  80.0 - 100.0 fL Final   MCH 01/13/2023 32.8  26.0 - 34.0 pg Final   MCHC 01/13/2023 35.7  30.0 - 36.0 g/dL Final   RDW 03/47/4259 12.5  11.5 - 15.5 % Final   Platelets 01/13/2023 239  150 - 400 K/uL Final   nRBC 01/13/2023 0.0  0.0 - 0.2 % Final   Neutrophils Relative % 01/13/2023 55  % Final   Neutro Abs 01/13/2023 4.8  1.7 - 7.7 K/uL Final   Lymphocytes Relative 01/13/2023 35  % Final   Lymphs Abs 01/13/2023 3.1  0.7 - 4.0 K/uL Final   Monocytes Relative 01/13/2023 8  %  Final   Monocytes Absolute 01/13/2023 0.7  0.1 - 1.0 K/uL Final   Eosinophils Relative 01/13/2023 1  % Final   Eosinophils Absolute 01/13/2023 0.1  0.0 - 0.5 K/uL Final   Basophils Relative 01/13/2023 1  % Final   Basophils Absolute 01/13/2023 0.1  0.0 - 0.1 K/uL Final   Immature Granulocytes 01/13/2023 0  % Final   Abs Immature Granulocytes 01/13/2023 0.03  0.00 - 0.07 K/uL Final   Performed at [facility], [address] [place], [place] [address]   Sodium 01/13/2023 136  135 - 145 mmol/L Final   Potassium 01/13/2023 3.2 (L)  3.5 - 5.1 mmol/L Final   Chloride 01/13/2023 101  98 - 111 mmol/L Final   CO2 01/13/2023 24  22 - 32 mmol/L Final   Glucose, Bld 01/13/2023 130 (H)  70 - 99 mg/dL Final   Glucose reference range applies only to samples taken after fasting for at least 8 hours.   BUN 01/13/2023 8  6 - 20 mg/dL Final   Creatinine, Ser 01/13/2023 0.66  0.61 - 1.24 mg/dL Final   Calcium 60/45/4098 8.3 (L)  8.9 - 10.3 mg/dL Final   Total Protein 11/91/4782 8.2 (H)  6.5 - 8.1 g/dL Final   Albumin 95/62/1308  4.1  3.5 - 5.0 g/dL Final   AST 65/78/4696 405 (H)  15 - 41 U/L Final   ALT 01/13/2023 247 (H)  0 - 44 U/L Final   Alkaline Phosphatase 01/13/2023 120  38 - 126 U/L Final   Total Bilirubin 01/13/2023 1.0  0.3 - 1.2 mg/dL Final   GFR, Estimated 01/13/2023 >60  >60 mL/min Final   Comment: (NOTE) Calculated using the CKD-EPI Creatinine Equation (2021)    Anion gap 01/13/2023 11  5 - 15 Final   Performed at [facility], [address], [place], [place] [address]   Alcohol, Ethyl (B) 01/13/2023 163 (H)  <10 mg/dL Final   Comment: (NOTE) Lowest detectable limit for serum alcohol is 10 mg/dL.  For medical purposes only. Performed at [facility], [address] [place], [place] [address]    Opiates 01/13/2023 NONE DETECTED  NONE DETECTED Final   Cocaine 01/13/2023 POSITIVE (A)  NONE DETECTED Final   Benzodiazepines 01/13/2023 NONE DETECTED  NONE DETECTED Final   Amphetamines 01/13/2023 POSITIVE (A)  NONE DETECTED Final   Tetrahydrocannabinol 01/13/2023 POSITIVE (A)  NONE DETECTED Final   Barbiturates 01/13/2023 NONE DETECTED  NONE DETECTED Final   Comment: (NOTE) DRUG SCREEN FOR MEDICAL PURPOSES ONLY.  IF CONFIRMATION IS NEEDED FOR ANY PURPOSE, NOTIFY LAB WITHIN 5 DAYS.  LOWEST DETECTABLE LIMITS FOR URINE DRUG SCREEN Drug Class                     Cutoff (ng/mL) Amphetamine and metabolites    1000 Barbiturate and metabolites    200 Benzodiazepine                 200 Opiates and metabolites        300 Cocaine and metabolites        300 THC                            50 Performed at [facility], [address] [place], [place] [address]    SARS Coronavirus 2 by RT PCR 01/13/2023 NEGATIVE  NEGATIVE Final   Comment: (NOTE) SARS-CoV-2 target nucleic acids  are NOT DETECTED.  The SARS-CoV-2 RNA is generally detectable in upper and lower respiratory specimens during the acute phase of infection. The lowest concentration of  SARS-CoV-2 viral copies this assay can detect is 250 copies / mL. A negative result does not preclude SARS-CoV-2 infection and should not be used as the sole basis for treatment or other patient management decisions.  A negative result may occur with improper specimen collection / handling, submission of specimen other than nasopharyngeal swab, presence of viral mutation(s) within the areas targeted by this assay, and inadequate number of viral copies (<250 copies / mL). A negative result must be combined with clinical observations, patient history, and epidemiological information.  Fact Sheet for Patients:   RoadLapTop.co.za  Fact Sheet for Healthcare Providers: http://kim-miller.com/  This test is not yet approved or                           cleared by the [place] FDA and has been authorized for detection and/or diagnosis of SARS-CoV-2 by FDA under an Emergency Use Authorization (EUA).  This EUA will remain in effect (meaning this test can be used) for the duration of the COVID-19 declaration under Section 564(b)(1) of the Act, 21 U.S.C. section 360bbb-3(b)(1), unless the authorization is terminated or revoked sooner.  Performed at [facility], [address], [place], [place] [address]    TSH 01/13/2023 0.745  0.350 - 4.500 uIU/mL Final   Comment: Performed by a 3rd Generation assay with a functional sensitivity of <=0.01 uIU/mL. Performed at [facility], [address], [place], [place] [address]    Hgb A1c MFr Bld 01/13/2023 5.5  4.8 - 5.6 % Final   Comment: (NOTE) Pre diabetes:          5.7%-6.4%  Diabetes:              >6.4%  Glycemic control for   <7.0% adults with diabetes    Mean Plasma Glucose 01/13/2023 111.15  mg/dL Final   Performed at [facility], [address], [place], [place] [address]   Cholesterol 01/13/2023 138  0 - 200 mg/dL Final   Triglycerides  01/13/2023 84  <150 mg/dL Final   HDL 85/46/2703 63  >40 mg/dL Final   Total CHOL/HDL Ratio 01/13/2023 2.2  RATIO Final   VLDL 01/13/2023 17  0 - 40 mg/dL Final   LDL Cholesterol 01/13/2023 58  0 - 99 mg/dL Final   Comment:        Total Cholesterol/HDL:CHD Risk Coronary Heart Disease Risk Table                     Men   Women  1/2 Average Risk   3.4   3.3  Average Risk       5.0   4.4  2 X Average Risk   9.6   7.1  3 X Average Risk  23.4   11.0        Use the calculated Patient Ratio above and the CHD Risk Table to determine the patient's CHD Risk.        ATP III CLASSIFICATION (LDL):  <100     mg/dL   Optimal  500-938  mg/dL   Near or Above                    Optimal  130-159  mg/dL   Borderline  182-993  mg/dL  High  >190     mg/dL   Very High Performed at [facility], [address], [place], [place] [address]     Blood Alcohol level:  Lab Results  Component Value Date   ETH 285 (H) 06/03/2023   ETH 279 (H) 05/28/2023    Metabolic Disorder Labs: Lab Results  Component Value Date   HGBA1C 5.5 02/24/2023   MPG 111 02/24/2023   MPG 116.89 01/14/2023   Lab Results  Component Value Date   PROLACTIN 31.4 (H) 11/16/2019   Lab Results  Component Value Date   CHOL 147 02/24/2023   TRIG 96 02/24/2023   HDL 42 02/24/2023   CHOLHDL 3.5 02/24/2023   VLDL 19 02/24/2023   LDLCALC 86 02/24/2023   LDLCALC 57 01/14/2023    Therapeutic Lab Levels: No results found for: "LITHIUM" No results found for: "VALPROATE" No results found for: "CBMZ"  Physical Findings   AIMS    Flowsheet Row [facility]) [facility] Ad[facility] (Discharged) [facility] Ad[facility] (Discharged[facility]  AIMS Total Score 0 0 0      AUDIT    Flowsheet Row Ad[facility]) [facility] Admission (Discharged) [facility] Admission (Discharged) from OP [facility] Ad[facility] (Discharged[facility]  Alcohol Use Disorder Identification Test Final Score (AUDIT) 26 26 13  37      Flowsheet Row ED from 06/03/2023 in [facility] ED from 06/01/2023 in [facility] at [facility] ED from 05/28/2023 in [facility] at [facility]  C-SSRS RISK CATEGORY High Risk No Risk No Risk        Musculoskeletal  Strength & Muscle Tone: {desc; muscle tone:32375} Gait & Station: {PE GAIT ED BJYN:82956} Patient leans: {Patient Leans:[id number]}  Psychiatric Specialty Exam  Presentation  General Appearance:  Disheveled  Eye Contact: Poor  Speech: Other (comment) (loud, incoherent)  Speech Volume: Increased  Handedness: Right   Mood and Affect  Mood: Irritable  Affect: Congruent   Thought Process  Thought Processes: Linear  Descriptions of Associations:Tangential  Orientation:Partial  Thought Content:Scattered; Tangential  Diagnosis of Schizophrenia or Schizoaffective disorder in past: Yes    Hallucinations:Hallucinations: Auditory; Visual  Ideas of Reference:None  Suicidal Thoughts:Suicidal Thoughts: Yes, Active SI Active Intent and/or Plan: With Plan  Homicidal Thoughts:Homicidal Thoughts: No   Sensorium  Memory: Immediate Fair; Recent Poor; Remote Poor  Judgment: Poor  Insight: Fair   Chartered certified accountant: Fair  Attention Span: Fair  Recall: [employer] of Knowledge: Fair  Language: Fair   Psychomotor Activity  Psychomotor Activity: Psychomotor Activity: Normal   Assets  Assets: Manufacturing systems engineer; Desire for  Improvement   Sleep  Sleep: Sleep: Fair   Nutritional Assessment (For OBS and [facility]BC admissions only) Has the patient had a weight loss or gain of 10 pounds or more in the last 3 months?: No Has the patient had a decrease in food intake/or appetite?: No Does the patient have dental problems?: No Does the patient have eating habits or behaviors that may be indicators of an eating disorder including binging or inducing vomiting?: No Has the patient recently lost weight without trying?: 0 Has the patient  been eating poorly because of a decreased appetite?: 0 Malnutrition Screening Tool Score: 0    Physical Exam  Physical Exam ROS Blood pressure 138/86, pulse 91, temperature 98 F (36.7 C), temperature source Oral, resp. rate 18, SpO2 100%. There is no height or weight on file to calculate BMI.  Treatment Plan Summary: {CHL [facility] MD [place]. WUJW:[id number]}  Ardis Hughs, NP 06/03/2023 5:19 PM

## 2023-06-03 NOTE — ED Notes (Signed)
Patient was provided lunch.

## 2023-06-03 NOTE — BH Assessment (Signed)
Comprehensive Clinical Assessment (CCA) Note   06/03/2023 Mark Hammond [id number]  Disposition: Abbie Sons, NP recommends continuous observation and is to be reassessed by AM provider.  The patient demonstrates the following risk factors for suicide: Chronic risk factors for suicide include: previous self-harm   . Acute risk factors for suicide include: unemployment. Protective factors for this patient include: hope for the future. Considering these factors, the overall suicide risk at this point appears to be high. Patient is not appropriate for outpatient follow up.     Patient presents to [facility] voluntarily by gpd. Patient reports being downtown and noone would let him use his phone to call EMS so he hopped on top of a police car to get help. Patient reports visual and auditory hallucinations. Patient reports passive SI with no plan. Patient does report past self harm. Patient denies HI at this time. Patient reports being homeless and staying at the [facility]. Patient does report frequent alcohol use. Patient reports prescription medications for hallucinations but does not take them. Patient is urgent.  Pt was unkempt with different writings with a black marker on his arms and stomach. Pt appeared intoxicated with pressured speech and delayed motor behavior. Eye contact is poor. Pt's mood is depressed, and affect is flat. Thought process is incoherent.  Pt's insight is fair and judgement is poor. There is an indication pt is currently responding to internal stimuli and experiencing delusional thought content. Pt was irritable throughout assessment.   Chief Complaint:  Chief Complaint  Patient presents with   Hallucinations   homelessness   Visit Diagnosis:  Schizoaffective Disorder     CCA Screening, Triage and Referral (STR)  Patient Reported Information How did you hear about [place]? Legal System  What Is the Reason for Your Visit/Call Today? Patient presents to [facility] voluntarily by gpd. Patient  reports being downtown and noone would let him use his phone to call EMS so he hopped on top of a police car to get help. Patient reports visual and auditory hallucinations. Patient reports passive SI with no plan. Patient does report past self harm. Patient denies HI at this time. Patient reports being homeless and staying at the [facility]. Patient does report frequent alcohol use. Patient reports prescription medications for hallucinations but does not take them. Patient is urgent.  How Long Has This Been Causing You Problems? > than 6 months  What Do You Feel Would Help You the Most Today? Treatment for Depression or other mood problem; Alcohol or Drug Use Treatment; Medication(s)   Have You Recently Had Any Thoughts About Hurting Yourself? Yes  Are You Planning to Commit Suicide/Harm Yourself At This time? No   Flowsheet Row ED from 06/03/2023 in [facility] ED from 06/01/2023 in [facility] Department at [facility] ED from 05/28/2023 in [facility] at [facility]  C-SSRS RISK CATEGORY High Risk No Risk No Risk       Have you Recently Had Thoughts About Hurting Someone [name]? No  Are You Planning to Harm Someone at This Time? No  Explanation: Pt denies HI   Have You Used Any Alcohol or Drugs in the Past 24 Hours? Yes  What Did You Use and How Much? Alcohol/ unable to report an amount if consumption   Do You Currently Have a Therapist/Psychiatrist? No  Name of Therapist/Psychiatrist: Name of Therapist/Psychiatrist: Pt denies any outpatient services.   Have You Been Recently Discharged From Any Office Practice or Programs?  No  Explanation of Discharge From Practice/Program: n/a     CCA Screening Triage Referral Assessment Type of Contact: Face-to-Face  Telemedicine Service Delivery:   Is this Initial or Reassessment?   Date Telepsych consult ordered in [facility]:    Time Telepsych consult ordered in [facility]:     Location of Assessment: [facility] Assessment Services  Provider Location: [facility] Assessment Services   Collateral Involvement: none   Does Patient Have a Automotive engineer Guardian? No  Legal Guardian Contact Information: n/a  Copy of Legal Guardianship Form: -- (n/a)  Legal Guardian Notified of Arrival: -- (n/a)  Legal Guardian Notified of Pending Discharge: -- (n/a)  If Minor and Not Living with Parent(s), Who has Custody? n/a  Is CPS involved or ever been involved? Never  Is APS involved or ever been involved? Never   Patient Determined To Be At Risk for Harm To Self or Others Based on Review of Patient Reported Information or Presenting Complaint? Yes, for Self-Harm  Method: No Plan  Availability of Means: No access or NA  Intent: Vague intent or NA  Notification Required: No need or identified person  Additional Information for Danger to Others Potential: Active psychosis (n/a)  Additional Comments for Danger to Others Potential: n/a  Are There Guns or Other Weapons in Your Home? No  Types of Guns/Weapons: Pt denies access to weapons/ guns  Are These Weapons Safely Secured?                            No  Who Could Verify You Are Able To Have These Secured: Pt denies access to weapons/ guns  Do You Have any Outstanding Charges, Pending Court Dates, Parole/Probation? Pt denies any pending legal charges  Contacted To Inform of Risk of Harm To Self or Others: -- (n/a)    Does Patient Present under Involuntary Commitment? No    [place] of Residence: [place]   Patient Currently Receiving the Following Services: Not Receiving Services   Determination of Need: Urgent (48 hours)   Options For Referral: Mobile Crisis; Medication Management; Inpatient Hospitalization     CCA Biopsychosocial Patient Reported Schizophrenia/Schizoaffective Diagnosis in Past: Yes   Strengths: Pt is motivated to get treatment   Mental Health Symptoms Depression:    Difficulty Concentrating; Change in energy/activity; Hopelessness; Sleep (too much or little); Irritability   Duration of Depressive symptoms:    Mania:   Increased Energy; Racing thoughts; Recklessness; Irritability   Anxiety:    Difficulty concentrating; Sleep; Tension; Worrying   Psychosis:   Hallucinations   Duration of Psychotic symptoms:    Trauma:   N/A   Obsessions:   N/A   Compulsions:   None   Inattention:   None   Hyperactivity/Impulsivity:   Fidgets with hands/feet; Feeling of restlessness   Oppositional/Defiant Behaviors:   None   Emotional Irregularity:   Chronic feelings of emptiness; Transient, stress-related paranoia/disassociation   Other Mood/Personality Symptoms:   None Reported    Mental Status Exam Appearance and self-care  Stature:   Small   Weight:   Average weight   Clothing:   Casual (Scrubs)   Grooming:   Normal   Cosmetic use:   None   Posture/gait:   Normal   Motor activity:   Restless; Agitated   Sensorium  Attention:   Distractible   Concentration:   Anxiety interferes   Orientation:   Situation; Place; Person; Object   Recall/memory:  Defective in Short-term   Affect and Mood  Affect:   Anxious   Mood:   Anxious   Relating  Eye contact:   Normal   Facial expression:   Anxious; Responsive   Attitude toward examiner:   Argumentative; Defensive; Irritable; Threatening   Thought and Language  Speech flow:  Flight of Ideas   Thought content:   Appropriate to Mood and Circumstances   Preoccupation:   None   Hallucinations:   Auditory; Command (Comment); Visual (Voices telling him to harm himself.)   Organization:   Coherent; Goal-directed; Development worker, international aid of Knowledge:   Fair   Intelligence:   Average   Abstraction:   Popular   Judgement:   Impaired   Reality Testing:   Adequate   Insight:   Fair   Decision Making:   Impulsive   Social  Functioning  Social Maturity:   Impulsive   Social Judgement:   Heedless; Impropriety   Stress  Stressors:   Housing; Office manager Ability:   Exhausted; Deficient supports   Skill Deficits:   None   Supports:   Family     Religion: Religion/Spirituality Are You A Religious Person?: No How Might This Affect Treatment?: Will not affect treatment  Leisure/Recreation: Leisure / Recreation Do You Have Hobbies?: No Leisure and Hobbies: n/a  Exercise/Diet: Exercise/Diet Do You Exercise?: No Have You Gained or Lost A Significant Amount of Weight in the Past Six Months?: No Do You Follow a Special Diet?: No Do You Have Any Trouble Sleeping?: No Explanation of Sleeping Difficulties: n/a   CCA Employment/Education Employment/Work Situation: Employment / Work Situation Employment Situation: Unemployed Patient's Job has Been Impacted by Current Illness: No Has Patient ever Been in Equities trader?: No  Education: Education Last Grade Completed: 10 Did You Product manager?: No Did You Have An Individualized Education Program (IIEP): No Did You Have Any Difficulty At [employer]?: No Patient's Education Has Been Impacted by Current Illness: No   CCA Family/Childhood History Family and Relationship History: Family history Marital status: Single Does patient have children?: No  Childhood History:  Childhood History By whom was/is the patient raised?: Grandparents Did patient suffer any verbal/emotional/physical/sexual abuse as a child?: No Did patient suffer from severe childhood neglect?: No Has patient ever been sexually abused/assaulted/raped as an adolescent or adult?: No Was the patient ever a victim of a crime or a disaster?: No Witnessed domestic violence?: No Has patient been affected by domestic violence as an adult?: No       CCA Substance Use Alcohol/Drug Use: Alcohol / Drug Use Pain Medications: None Prescriptions: None Over the Counter:  Tylenol, benedryl History of alcohol / drug use?: Yes Longest period of sobriety (when/how long): Unable to Quantify Negative Consequences of Use: Financial, Legal, Personal relationships, Work / School Withdrawal Symptoms: Irritability Substance #1 Name of Substance 1: Alcohol 1 - Age of First Use: unknown 1 - Amount (size/oz): "a beer" 1 - Frequency: daily 1 - Last Use / Amount: "last night" - 06/02/23 Substance #2 Name of Substance 2: cocaine 2 - Age of First Use: unknown 2 - Amount (size/oz): unknown 2 - Frequency: 2-3 times a week 2 - Last Use / Amount: 06/02/23                     ASAM's:  Six Dimensions of Multidimensional Assessment  Dimension 1:  Acute Intoxication and/or Withdrawal Potential:      Dimension 2:  Biomedical Conditions and Complications:      Dimension 3:  Emotional, Behavioral, or Cognitive Conditions and Complications:     Dimension 4:  Readiness to Change:     Dimension 5:  Relapse, Continued use, or Continued Problem Potential:     Dimension 6:  Recovery/Living Environment:     ASAM Severity Score:    ASAM Recommended Level of Treatment: ASAM Recommended Level of Treatment: Level II Intensive Outpatient Treatment   Substance use Disorder (SUD) Substance Use Disorder (SUD)  Checklist Symptoms of Substance Use: Continued use despite having a persistent/recurrent physical/psychological problem caused/exacerbated by use, Continued use despite persistent or recurrent social, interpersonal problems, caused or exacerbated by use  Recommendations for Services/Supports/Treatments: Recommendations for Services/Supports/Treatments Recommendations For Services/Supports/Treatments: Medication Management, Inpatient Hospitalization, Individual Therapy (Patient declined outpatient therapy at this time)  Discharge Disposition:    DSM5 Diagnoses: Patient Active Problem List   Diagnosis Date Noted   Hepatitis C 03/01/2023   Anxiety state 01/14/2023    Insomnia 01/14/2023   Schizoaffective disorder (HCC) 01/13/2023   Schizophrenia (HCC) 11/15/2019   Polysubstance (including opioids) dependence, daily use (HCC)    Substance induced mood disorder (HCC) 10/24/2019   Methamphetamine use disorder, 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   Alcohol use disorder, severe, dependence (HCC) 08/24/2019   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

## 2023-06-03 NOTE — ED Notes (Addendum)
Patient was giving tylenol for fever. Will continue to monitor. Showing no s/s of a fever. Rn will notify oncoming shift to recheck temp

## 2023-06-03 NOTE — ED Notes (Signed)
Pt alert/oriented in recliner bed. Denies SI/HI but reports AVH. HE does not explain what he is hearing or seeing but states "its there". Denies c/o pain. He does endorse mild anxiety, but contracts for safety. No noted distress. Will continue to monitor for safety

## 2023-06-03 NOTE — ED Provider Notes (Signed)
[facility] Continuous Assessment Admission H&P  Date: 06/03/23 Patient Name: Mark Hammond MRN: [id number] Chief Complaint: hallunication  Diagnoses:  Final diagnoses:  None    HPI: Mark Hammond is a 32 year old male with a psychiatric history of polysubstance abuse and schizoaffective disorder.  Patient was brought to the [facility] voluntarily by law enforcement due to hallucinations.   The patient was evaluated in person, and his chart was reviewed by the nurse practitioner. During the assessment, the patient was found sleeping in the assessment room and  woke up after several verbal prompts. He displayed significant irritability and appears to be responding to internal stimuli. He was noted to be arguing, using profane language, and speaking loudly to people that were not present in the room and this Nurse practitioner could not see. He reported, "The voices in my head are bothering me because they are on their periods," and mentioned experiencing both visual and auditory hallucinations. He described auditory hallucinations "voices are telling me to kill myself" and showed several scars from previous self-harming/suicide attempts. He admits to experiencing suicidal ideation but refused to share if he has suicidal plan or intention to harm himself. He could not provide details about the visual hallucinations he is experiencing. The patient admitted to not taking his psychiatric medication since his last hospitalization at [facility].  He says he used crack cocaine, fentanyl, and heroin about 3-5 days ago. He reports drinking 1 beer per day.   He appeared drowsy, oriented to himself, and seemed intoxicated. His speech was tangential, loud volume, and moderate pace. He had poor eye contact. His mood was irritable with a congruent affect, and he appeared to be under the influence of substances while also responding to internal stimuli. He had difficulty engaging in the  assessment.  Total Time spent with patient: 30 minutes  Musculoskeletal  Strength & Muscle Tone: within normal limits Gait & Station: normal Patient leans: Right  Psychiatric Specialty Exam  Presentation General Appearance:  Disheveled  Eye Contact: Poor  Speech: Other (comment) (loud, incoherent)  Speech Volume: Increased  Handedness: Right   Mood and Affect  Mood: Irritable  Affect: Congruent   Thought Process  Thought Processes: Linear  Descriptions of Associations:Tangential  Orientation:Partial  Thought Content:Scattered; Tangential  Diagnosis of Schizophrenia or Schizoaffective disorder in past: Yes   Hallucinations:Hallucinations: Auditory; Visual  Ideas of Reference:None  Suicidal Thoughts:Suicidal Thoughts: Yes, Active SI Active Intent and/or Plan: With Plan  Homicidal Thoughts:Homicidal Thoughts: No   Sensorium  Memory: Immediate Fair; Recent Poor; Remote Poor  Judgment: Poor  Insight: Fair   Chartered certified accountant: Fair  Attention Span: Fair  Recall: [employer] of Knowledge: Fair  Language: Fair   Psychomotor Activity  Psychomotor Activity: Psychomotor Activity: Normal   Assets  Assets: Manufacturing systems engineer; Desire for Improvement   Sleep  Sleep: Sleep: Fair   Nutritional Assessment (For OBS and FBC admissions only) Has the patient had a weight loss or gain of 10 pounds or more in the last 3 months?: No Has the patient had a decrease in food intake/or appetite?: No Does the patient have dental problems?: No Does the patient have eating habits or behaviors that may be indicators of an eating disorder including binging or inducing vomiting?: No Has the patient recently lost weight without trying?: 0 Has the patient been eating poorly because of a decreased appetite?: 0 Malnutrition Screening Tool Score: 0    Physical Exam Vitals and nursing note reviewed.  Constitutional:  General: He is not in acute distress.    Appearance: He is well-developed. He is not ill-appearing.  HENT:     Head: Normocephalic and atraumatic.  Eyes:     Conjunctiva/sclera: Conjunctivae normal.  Cardiovascular:     Rate and Rhythm: Normal rate.  Pulmonary:     Effort: Pulmonary effort is normal.  Musculoskeletal:     Cervical back: Neck supple.  Skin:    General: Skin is warm and dry.  Neurological:     Mental Status: He is alert and oriented to person, place, and time.  Psychiatric:        Attention and Perception: Attention and perception normal.        Mood and Affect: Angry: irritable.        Speech: Speech is tangential (increased volume, tangential).        Behavior: Behavior is aggressive.        Thought Content: Thought content includes suicidal ideation.    Review of Systems  Constitutional: Negative.   HENT: Negative.    Eyes: Negative.   Respiratory: Negative.    Cardiovascular: Negative.   Gastrointestinal: Negative.   Genitourinary: Negative.   Musculoskeletal: Negative.   Skin: Negative.   Neurological: Negative.   Endo/Heme/Allergies: Negative.   Psychiatric/Behavioral:  Positive for hallucinations, substance abuse and suicidal ideas.     Blood pressure 122/80, pulse 88, temperature 98 F (36.7 C), temperature source Oral, resp. rate 18, SpO2 100%. There is no height or weight on file to calculate BMI.  Past Psychiatric History:    Is the patient at risk to self? Yes  Has the patient been a risk to self in the past 6 months? Yes .    Has the patient been a risk to self within the distant past? Yes   Is the patient a risk to others? Yes   Has the patient been a risk to others in the past 6 months? No   Has the patient been a risk to others within the distant past? Yes   Past Medical History: Polysubstance abuse, suicidal ideation, schizoaffective disorder.   Family History:  Family History  Problem Relation Age of Onset   Stroke Other       Social History:  Social History   Tobacco Use   Smoking status: Every Day    Current packs/day: 1.00    Types: Cigarettes   Smokeless tobacco: Never  Vaping Use   Vaping status: Every Day   Substances: THC  Substance Use Topics   Alcohol use: Yes    Comment: 4-5x 40oz beers daily   Drug use: Yes    Frequency: 7.0 times per week    Types: "Crack" cocaine, Heroin, MDMA (Ecstacy), Marijuana, Cocaine    Comment: frequent stimulant use     Last Labs:  Admission on 05/28/2023, Discharged on 05/28/2023  Component Date Value Ref Range Status   WBC 05/28/2023 11.2 (H)  4.0 - 10.5 K/uL Final   RBC 05/28/2023 4.18 (L)  4.22 - 5.81 MIL/uL Final   Hemoglobin 05/28/2023 14.3  13.0 - 17.0 g/dL Final   HCT 13/05/6577 41.5  39.0 - 52.0 % Final   MCV 05/28/2023 99.3  80.0 - 100.0 fL Final   MCH 05/28/2023 34.2 (H)  26.0 - 34.0 pg Final   MCHC 05/28/2023 34.5  30.0 - 36.0 g/dL Final   RDW 46/96/2952 13.2  11.5 - 15.5 % Final   Platelets 05/28/2023 244  150 - 400 K/uL Final   nRBC  05/28/2023 0.0  0.0 - 0.2 % Final   Neutrophils Relative % 05/28/2023 65  % Final   Neutro Abs 05/28/2023 7.3  1.7 - 7.7 K/uL Final   Lymphocytes Relative 05/28/2023 27  % Final   Lymphs Abs 05/28/2023 3.0  0.7 - 4.0 K/uL Final   Monocytes Relative 05/28/2023 6  % Final   Monocytes Absolute 05/28/2023 0.7  0.1 - 1.0 K/uL Final   Eosinophils Relative 05/28/2023 1  % Final   Eosinophils Absolute 05/28/2023 0.1  0.0 - 0.5 K/uL Final   Basophils Relative 05/28/2023 1  % Final   Basophils Absolute 05/28/2023 0.1  0.0 - 0.1 K/uL Final   Immature Granulocytes 05/28/2023 0  % Final   Abs Immature Granulocytes 05/28/2023 0.04  0.00 - 0.07 K/uL Final   Performed at [facility], [address] [place], [place] [address]   Sodium 05/28/2023 139  135 - 145 mmol/L Final   Potassium 05/28/2023 3.1 (L)  3.5 - 5.1 mmol/L Final   Chloride 05/28/2023 106  98 - 111 mmol/L Final   CO2 05/28/2023 22  22 - 32 mmol/L  Final   Glucose, Bld 05/28/2023 100 (H)  70 - 99 mg/dL Final   Glucose reference range applies only to samples taken after fasting for at least 8 hours.   BUN 05/28/2023 6  6 - 20 mg/dL Final   Creatinine, Ser 05/28/2023 0.70  0.61 - 1.24 mg/dL Final   Calcium 60/45/4098 8.0 (L)  8.9 - 10.3 mg/dL Final   GFR, Estimated 05/28/2023 >60  >60 mL/min Final   Comment: (NOTE) Calculated using the CKD-EPI Creatinine Equation (2021)    Anion gap 05/28/2023 11  5 - 15 Final   Performed at [facility], [address] [place], [place] [address]   Alcohol, Ethyl (B) 05/28/2023 279 (H)  <10 mg/dL Final   Comment: (NOTE) Lowest detectable limit for serum alcohol is 10 mg/dL.  For medical purposes only. Performed at [facility], [address], [place], [place] [address]    Opiates 05/28/2023 POSITIVE (A)  NONE DETECTED Final   Cocaine 05/28/2023 POSITIVE (A)  NONE DETECTED Final   Benzodiazepines 05/28/2023 NONE DETECTED  NONE DETECTED Final   Amphetamines 05/28/2023 POSITIVE (A)  NONE DETECTED Final   Tetrahydrocannabinol 05/28/2023 POSITIVE (A)  NONE DETECTED Final   Barbiturates 05/28/2023 NONE DETECTED  NONE DETECTED Final   Comment: (NOTE) DRUG SCREEN FOR MEDICAL PURPOSES ONLY.  IF CONFIRMATION IS NEEDED FOR ANY PURPOSE, NOTIFY LAB WITHIN 5 DAYS.  LOWEST DETECTABLE LIMITS FOR URINE DRUG SCREEN Drug Class                     Cutoff (ng/mL) Amphetamine and metabolites    1000 Barbiturate and metabolites    200 Benzodiazepine                 200 Opiates and metabolites        300 Cocaine and metabolites        300 THC                            50 Performed at [facility], [address], [place], [place] [address]   Admission on 05/12/2023, Discharged on 05/12/2023  Component Date Value Ref Range Status   Sodium 05/12/2023 133 (L)  135 - 145 mmol/L Final   Potassium 05/12/2023 3.1 (L)  3.5 -  5.1 mmol/L Final   Chloride 05/12/2023 97 (L)  98 - 111 mmol/L Final    CO2 05/12/2023 21 (L)  22 - 32 mmol/L Final   Glucose, Bld 05/12/2023 101 (H)  70 - 99 mg/dL Final   Glucose reference range applies only to samples taken after fasting for at least 8 hours.   BUN 05/12/2023 7  6 - 20 mg/dL Final   Creatinine, Ser 05/12/2023 0.61  0.61 - 1.24 mg/dL Final   Calcium 16/07/9603 8.7 (L)  8.9 - 10.3 mg/dL Final   Total Protein 54/06/8118 8.1  6.5 - 8.1 g/dL Final   Albumin 14/78/2956 3.6  3.5 - 5.0 g/dL Final   AST 21/30/8657 566 (H)  15 - 41 U/L Final   ALT 05/12/2023 436 (H)  0 - 44 U/L Final   Alkaline Phosphatase 05/12/2023 127 (H)  38 - 126 U/L Final   Total Bilirubin 05/12/2023 1.3 (H)  0.3 - 1.2 mg/dL Final   GFR, Estimated 05/12/2023 >60  >60 mL/min Final   Comment: (NOTE) Calculated using the CKD-EPI Creatinine Equation (2021)    Anion gap 05/12/2023 15  5 - 15 Final   Performed at [facility], [address] [place], [place] [address]   Alcohol, Ethyl (B) 05/12/2023 <10  <10 mg/dL Final   Comment: (NOTE) Lowest detectable limit for serum alcohol is 10 mg/dL.  For medical purposes only. Performed at [facility], [address] [place], [place] [address]    Opiates 05/12/2023 NONE DETECTED  NONE DETECTED Final   Cocaine 05/12/2023 POSITIVE (A)  NONE DETECTED Final   Benzodiazepines 05/12/2023 NONE DETECTED  NONE DETECTED Final   Amphetamines 05/12/2023 POSITIVE (A)  NONE DETECTED Final   Tetrahydrocannabinol 05/12/2023 NONE DETECTED  NONE DETECTED Final   Barbiturates 05/12/2023 NONE DETECTED  NONE DETECTED Final   Comment: (NOTE) DRUG SCREEN FOR MEDICAL PURPOSES ONLY.  IF CONFIRMATION IS NEEDED FOR ANY PURPOSE, NOTIFY LAB WITHIN 5 DAYS.  LOWEST DETECTABLE LIMITS FOR URINE DRUG SCREEN Drug Class                     Cutoff (ng/mL) Amphetamine and metabolites    1000 Barbiturate and metabolites    200 Benzodiazepine                 200 Opiates and metabolites        300 Cocaine and metabolites        300 THC                             50 Performed at [facility], [address], [place], [place] [address]    WBC 05/12/2023 6.8  4.0 - 10.5 K/uL Final   RBC 05/12/2023 4.77  4.22 - 5.81 MIL/uL Final   Hemoglobin 05/12/2023 15.7  13.0 - 17.0 g/dL Final   HCT 40/07/2724 45.3  39.0 - 52.0 % Final   MCV 05/12/2023 95.0  80.0 - 100.0 fL Final   MCH 05/12/2023 32.9  26.0 - 34.0 pg Final   MCHC 05/12/2023 34.7  30.0 - 36.0 g/dL Final   RDW 36/64/4034 13.0  11.5 - 15.5 % Final   Platelets 05/12/2023 194  150 - 400 K/uL Final   nRBC 05/12/2023 0.0  0.0 - 0.2 % Final   Neutrophils Relative % 05/12/2023 73  % Final   Neutro Abs 05/12/2023 5.0  1.7 - 7.7  K/uL Final   Lymphocytes Relative 05/12/2023 17  % Final   Lymphs Abs 05/12/2023 1.1  0.7 - 4.0 K/uL Final   Monocytes Relative 05/12/2023 8  % Final   Monocytes Absolute 05/12/2023 0.5  0.1 - 1.0 K/uL Final   Eosinophils Relative 05/12/2023 1  % Final   Eosinophils Absolute 05/12/2023 0.1  0.0 - 0.5 K/uL Final   Basophils Relative 05/12/2023 1  % Final   Basophils Absolute 05/12/2023 0.1  0.0 - 0.1 K/uL Final   Immature Granulocytes 05/12/2023 0  % Final   Abs Immature Granulocytes 05/12/2023 0.03  0.00 - 0.07 K/uL Final   Performed at [facility], [address], [place], [place] [address]   SARS Coronavirus 2 by RT PCR 05/12/2023 NEGATIVE  NEGATIVE Final   Performed at [facility], [address] [place], [place] [address]   Total CK 05/12/2023 438 (H)  49 - 397 U/L Final   Performed at [facility], [address] [place], [place] [address]  Admission on 03/05/2023, Discharged on 03/05/2023  Component Date Value Ref Range Status   Sodium 03/05/2023 137  135 - 145 mmol/L Final   Potassium 03/05/2023 3.2 (L)  3.5 - 5.1 mmol/L Final   Chloride 03/05/2023 102  98 - 111 mmol/L Final   CO2 03/05/2023 24  22 - 32 mmol/L Final   Glucose, Bld 03/05/2023 209 (H)  70 - 99 mg/dL Final   Glucose reference range applies only to samples  taken after fasting for at least 8 hours.   BUN 03/05/2023 10  6 - 20 mg/dL Final   Creatinine, Ser 03/05/2023 0.78  0.61 - 1.24 mg/dL Final   Calcium 91/47/8295 8.1 (L)  8.9 - 10.3 mg/dL Final   Total Protein 62/13/0865 7.0  6.5 - 8.1 g/dL Final   Albumin 78/46/9629 3.1 (L)  3.5 - 5.0 g/dL Final   AST 52/84/1324 287 (H)  15 - 41 U/L Final   ALT 03/05/2023 261 (H)  0 - 44 U/L Final   Alkaline Phosphatase 03/05/2023 140 (H)  38 - 126 U/L Final   Total Bilirubin 03/05/2023 0.7  0.3 - 1.2 mg/dL Final   GFR, Estimated 03/05/2023 >60  >60 mL/min Final   Comment: (NOTE) Calculated using the CKD-EPI Creatinine Equation (2021)    Anion gap 03/05/2023 11  5 - 15 Final   Performed at [facility], [address] [place], [place] [address]   Alcohol, Ethyl (B) 03/05/2023 171 (H)  <10 mg/dL Final   Comment: (NOTE) Lowest detectable limit for serum alcohol is 10 mg/dL.  For medical purposes only. Performed at [facility], [address] [place], [place] [address]    WBC 03/05/2023 11.4 (H)  4.0 - 10.5 K/uL Final   RBC 03/05/2023 4.20 (L)  4.22 - 5.81 MIL/uL Final   Hemoglobin 03/05/2023 13.9  13.0 - 17.0 g/dL Final   HCT 64/40/3474 40.4  39.0 - 52.0 % Final   MCV 03/05/2023 96.2  80.0 - 100.0 fL Final   MCH 03/05/2023 33.1  26.0 - 34.0 pg Final   MCHC 03/05/2023 34.4  30.0 - 36.0 g/dL Final   RDW 25/95/6387 13.8  11.5 - 15.5 % Final   Platelets 03/05/2023 392  150 - 400 K/uL Final   nRBC 03/05/2023 0.0  0.0 - 0.2 % Final   Performed at [facility], [address], [place], [place] [address]   Opiates 03/05/2023  NONE DETECTED  NONE DETECTED Final   Cocaine 03/05/2023 POSITIVE (A)  NONE DETECTED Final   Benzodiazepines 03/05/2023 POSITIVE (A)  NONE DETECTED Final   Amphetamines 03/05/2023 NONE DETECTED  NONE DETECTED Final   Tetrahydrocannabinol 03/05/2023 POSITIVE (A)  NONE DETECTED Final   Barbiturates 03/05/2023 NONE DETECTED  NONE DETECTED Final   Comment:  (NOTE) DRUG SCREEN FOR MEDICAL PURPOSES ONLY.  IF CONFIRMATION IS NEEDED FOR ANY PURPOSE, NOTIFY LAB WITHIN 5 DAYS.  LOWEST DETECTABLE LIMITS FOR URINE DRUG SCREEN Drug Class                     Cutoff (ng/mL) Amphetamine and metabolites    1000 Barbiturate and metabolites    200 Benzodiazepine                 200 Opiates and metabolites        300 Cocaine and metabolites        300 THC                            50 Performed at [facility], [address], [place], [place] [address]   Admission on 02/23/2023, Discharged on 03/01/2023  Component Date Value Ref Range Status   Cholesterol 02/24/2023 147  0 - 200 mg/dL Final   Triglycerides 46/96/2952 96  <150 mg/dL Final   HDL 84/13/2440 42  >40 mg/dL Final   Total CHOL/HDL Ratio 02/24/2023 3.5  RATIO Final   VLDL 02/24/2023 19  0 - 40 mg/dL Final   LDL Cholesterol 02/24/2023 86  0 - 99 mg/dL Final   Comment:        Total Cholesterol/HDL:CHD Risk Coronary Heart Disease Risk Table                     Men   Women  1/2 Average Risk   3.4   3.3  Average Risk       5.0   4.4  2 X Average Risk   9.6   7.1  3 X Average Risk  23.4   11.0        Use the calculated Patient Ratio above and the CHD Risk Table to determine the patient's CHD Risk.        ATP III CLASSIFICATION (LDL):  <100     mg/dL   Optimal  102-725  mg/dL   Near or Above                    Optimal  130-159  mg/dL   Borderline  366-440  mg/dL   High  >347     mg/dL   Very High Performed at [facility], [address], [place], [place] [address]    Hgb A1c MFr Bld 02/24/2023 5.5  4.8 - 5.6 % Final   Comment: (NOTE)         Prediabetes: 5.7 - 6.4         Diabetes: >6.4         Glycemic control for adults with diabetes: <7.0    Mean Plasma Glucose 02/24/2023 111  mg/dL Final   Comment: (NOTE) Performed At: [facility] [address] [place], [place] [id number] Jolene Schimke MD IR:[id number]    Hepatitis B Surface Ag  02/25/2023 NON REACTIVE  NON REACTIVE Final   HCV Ab 02/25/2023 Reactive (A)  NON REACTIVE Final   Comment: (NOTE) The  CDC recommends that a Reactive HCV antibody result be followed up  with a HCV Nucleic Acid Amplification test.     Hep A IgM 02/25/2023 NON REACTIVE  NON REACTIVE Final   Hep B C IgM 02/25/2023 NON REACTIVE  NON REACTIVE Final   Performed at [facility], [address], [place], [place] [address]   Potassium 02/25/2023 4.4  3.5 - 5.1 mmol/L Final   Performed at [facility], [address] [place], [place] [address]   HCV RNA, Quantitation 02/27/2023 See Final Results  IU/mL Final   Test Information: 02/27/2023 Comment   Final   Comment: (NOTE) The quantitative range of this assay is 15 IU/mL to 100 million IU/mL. Performed At: [facility] [address] [place], [place] [id number] Jolene Schimke MD EX:[id number]    Sodium 02/27/2023 138  135 - 145 mmol/L Final   Potassium 02/27/2023 4.1  3.5 - 5.1 mmol/L Final   Chloride 02/27/2023 102  98 - 111 mmol/L Final   CO2 02/27/2023 28  22 - 32 mmol/L Final   Glucose, Bld 02/27/2023 96  70 - 99 mg/dL Final   Glucose reference range applies only to samples taken after fasting for at least 8 hours.   BUN 02/27/2023 9  6 - 20 mg/dL Final   Creatinine, Ser 02/27/2023 0.75  0.61 - 1.24 mg/dL Final   Calcium 07/30/2535 8.4 (L)  8.9 - 10.3 mg/dL Final   Total Protein 64/40/3474 7.1  6.5 - 8.1 g/dL Final   Albumin 25/95/6387 2.9 (L)  3.5 - 5.0 g/dL Final   AST 56/43/3295 168 (H)  15 - 41 U/L Final   ALT 02/27/2023 193 (H)  0 - 44 U/L Final   Alkaline Phosphatase 02/27/2023 97  38 - 126 U/L Final   Total Bilirubin 02/27/2023 0.5  0.3 - 1.2 mg/dL Final   GFR, Estimated 02/27/2023 >60  >60 mL/min Final   Comment: (NOTE) Calculated using the CKD-EPI Creatinine Equation (2021)    Anion gap 02/27/2023 8  5 - 15 Final   Performed at [facility], [address]  [place], [place] [address]   HCV RNA - Quantitation 02/27/2023 6,550,000  IU/mL Final   Comment: (NOTE)                HCV RNA detected HCV RNA viral loads >/= 25 IU/mL indicate current HCV infection.    HCV RNA - log10 02/27/2023 6.816  log10 IU/mL Final   Comment: (NOTE) Performed At: [facility] [address] [place], [place] [id number] Jolene Schimke MD FU:[id number]   Admission on 02/23/2023, Discharged on 02/23/2023  Component Date Value Ref Range Status   Acetaminophen (Tylenol), Serum 02/23/2023 <10 (L)  10 - 30 ug/mL Final   Comment: (NOTE) Therapeutic concentrations vary significantly. A range of 10-30 ug/mL  may be an effective concentration for many patients. However, some  are best treated at concentrations outside of this range. Acetaminophen concentrations >150 ug/mL at 4 hours after ingestion  and >50 ug/mL at 12 hours after ingestion are often associated with  toxic reactions.  Performed at [facility], [address], [place], [place] [address]    Sodium 02/23/2023 138  135 - 145 mmol/L Final   Potassium 02/23/2023 3.1 (L)  3.5 - 5.1 mmol/L Final   Chloride 02/23/2023 103  98 - 111 mmol/L Final   CO2 02/23/2023 24  22 - 32 mmol/L Final   Glucose, Bld 02/23/2023 115 (  H)  70 - 99 mg/dL Final   Glucose reference range applies only to samples taken after fasting for at least 8 hours.   BUN 02/23/2023 9  6 - 20 mg/dL Final   Creatinine, Ser 02/23/2023 0.75  0.61 - 1.24 mg/dL Final   Calcium 16/07/9603 8.2 (L)  8.9 - 10.3 mg/dL Final   Total Protein 54/06/8118 7.6  6.5 - 8.1 g/dL Final   Albumin 14/78/2956 3.3 (L)  3.5 - 5.0 g/dL Final   AST 21/30/8657 168 (H)  15 - 41 U/L Final   ALT 02/23/2023 138 (H)  0 - 44 U/L Final   Alkaline Phosphatase 02/23/2023 127 (H)  38 - 126 U/L Final   Total Bilirubin 02/23/2023 0.6  0.3 - 1.2 mg/dL Final   GFR, Estimated 02/23/2023 >60  >60 mL/min Final   Comment: (NOTE) Calculated using the CKD-EPI  Creatinine Equation (2021)    Anion gap 02/23/2023 11  5 - 15 Final   Performed at [facility], [address] [place], [place] [address]   Alcohol, Ethyl (B) 02/23/2023 107 (H)  <10 mg/dL Final   Comment: (NOTE) Lowest detectable limit for serum alcohol is 10 mg/dL.  For medical purposes only. Performed at [facility], [address] [place], [place] [address]    WBC 02/23/2023 9.3  4.0 - 10.5 K/uL Final   RBC 02/23/2023 4.19 (L)  4.22 - 5.81 MIL/uL Final   Hemoglobin 02/23/2023 13.9  13.0 - 17.0 g/dL Final   HCT 41/32/4401 40.5  39.0 - 52.0 % Final   MCV 02/23/2023 96.7  80.0 - 100.0 fL Final   MCH 02/23/2023 33.2  26.0 - 34.0 pg Final   MCHC 02/23/2023 34.3  30.0 - 36.0 g/dL Final   RDW 02/72/5366 14.3  11.5 - 15.5 % Final   Platelets 02/23/2023 227  150 - 400 K/uL Final   nRBC 02/23/2023 0.0  0.0 - 0.2 % Final   Neutrophils Relative % 02/23/2023 61  % Final   Neutro Abs 02/23/2023 5.6  1.7 - 7.7 K/uL Final   Lymphocytes Relative 02/23/2023 31  % Final   Lymphs Abs 02/23/2023 2.9  0.7 - 4.0 K/uL Final   Monocytes Relative 02/23/2023 6  % Final   Monocytes Absolute 02/23/2023 0.5  0.1 - 1.0 K/uL Final   Eosinophils Relative 02/23/2023 1  % Final   Eosinophils Absolute 02/23/2023 0.1  0.0 - 0.5 K/uL Final   Basophils Relative 02/23/2023 1  % Final   Basophils Absolute 02/23/2023 0.1  0.0 - 0.1 K/uL Final   Immature Granulocytes 02/23/2023 0  % Final   Abs Immature Granulocytes 02/23/2023 0.03  0.00 - 0.07 K/uL Final   Performed at [facility], [address] [place], [place] [address]   Salicylate Lvl 02/23/2023 <7.0 (L)  7.0 - 30.0 mg/dL Final   Performed at [facility], [address] [place], [place] [address]   Opiates 02/23/2023 NONE DETECTED  NONE DETECTED Final   Cocaine 02/23/2023 POSITIVE (A)  NONE DETECTED Final   Benzodiazepines 02/23/2023 NONE DETECTED  NONE DETECTED Final    Amphetamines 02/23/2023 NONE DETECTED  NONE DETECTED Final   Tetrahydrocannabinol 02/23/2023 POSITIVE (A)  NONE DETECTED Final   Barbiturates 02/23/2023 NONE DETECTED  NONE DETECTED Final   Comment: (NOTE) DRUG SCREEN FOR MEDICAL PURPOSES ONLY.  IF CONFIRMATION IS NEEDED FOR ANY PURPOSE, NOTIFY LAB WITHIN 5 DAYS.  LOWEST DETECTABLE LIMITS FOR URINE DRUG SCREEN Drug  Class                     Cutoff (ng/mL) Amphetamine and metabolites    1000 Barbiturate and metabolites    200 Benzodiazepine                 200 Opiates and metabolites        300 Cocaine and metabolites        300 THC                            50 [facility]formed at [facility] [facility], [address] [place], [place] [address]   Admission on 01/13/2023, Discharged on 01/23/2023  Component Date Value Ref Range Status   Hgb A1c MFr Bld 01/14/2023 5.7 (H)  4.8 - 5.6 % Final   Comment: (NOTE) Pre diabetes:          5.7%-6.4%  Diabetes:              >6.4%  Glycemic control for   <7.0% adults with diabetes    Mean Plasma Glucose 01/14/2023 116.89  mg/dL Final   Performed at [facility], [address], [place], [place] [address]   Cholesterol 01/14/2023 126  0 - 200 mg/dL Final   Triglycerides 47/82/9562 65  <150 mg/dL Final   HDL 13/05/6577 56  >40 mg/dL Final   Total CHOL/HDL Ratio 01/14/2023 2.3  RATIO Final   VLDL 01/14/2023 13  0 - 40 mg/dL Final   LDL Cholesterol 01/14/2023 57  0 - 99 mg/dL Final   Comment:        Total Cholesterol/HDL:CHD Risk Coronary Heart Disease Risk Table                     Men   Women  1/2 Average Risk   3.4   3.3  Average Risk       5.0   4.4  2 X Average Risk   9.6   7.1  3 X Average Risk  23.4   11.0        Use the calculated Patient Ratio above and the CHD Risk Table to determine the patient's CHD Risk.        ATP III CLASSIFICATION (LDL):  <100     mg/dL   Optimal  469-629  mg/dL   Near or Above                    Optimal  130-159  mg/dL   Borderline   528-413  mg/dL   High  >244     mg/dL   Very High Performed at [facility], [address] [place], [place] [address]    TSH 01/14/2023 1.134  0.350 - 4.500 uIU/mL Final   Comment: Performed by a 3rd Generation assay with a functional sensitivity of <=0.01 uIU/mL. Performed at [facility], [address], [place], [place] [address]    Vit D, 25-Hydroxy 01/15/2023 26.86 (L)  30 - 100 ng/mL Final   Comment: (NOTE) Vitamin D deficiency has been defined by the Institute of Medicine  and an Endocrine Society practice guideline as a level of serum 25-OH  vitamin D less than 20 ng/mL (1,2). The Endocrine Society went on to  further define vitamin D insufficiency as a level between 21 and 29  ng/mL (2).  1. IOM (Institute of Medicine). 2010. Dietary  reference intakes for  calcium and D. [place]: [employer]. 2. Holick MF, Binkley , Bischoff-Ferrari HA, et al. Evaluation,  treatment, and prevention of vitamin D deficiency: an Endocrine  Society clinical practice guideline, JCEM. 2011 Jul; 96(7): 1911-30.  Performed at [facility], [address], [place], [place] [address]    Sodium 01/15/2023 139  135 - 145 mmol/L Final   Potassium 01/15/2023 3.7  3.5 - 5.1 mmol/L Final   Chloride 01/15/2023 105  98 - 111 mmol/L Final   CO2 01/15/2023 26  22 - 32 mmol/L Final   Glucose, Bld 01/15/2023 126 (H)  70 - 99 mg/dL Final   Glucose reference range applies only to samples taken after fasting for at least 8 hours.   BUN 01/15/2023 10  6 - 20 mg/dL Final   Creatinine, Ser 01/15/2023 0.62  0.61 - 1.24 mg/dL Final   Calcium 91/47/8295 8.9  8.9 - 10.3 mg/dL Final   Total Protein 62/13/0865 7.8  6.5 - 8.1 g/dL Final   Albumin 78/46/9629 3.7  3.5 - 5.0 g/dL Final   AST 52/84/1324 288 (H)  15 - 41 U/L Final   ALT 01/15/2023 211 (H)  0 - 44 U/L Final   Alkaline Phosphatase 01/15/2023 115  38 - 126 U/L Final   Total Bilirubin  01/15/2023 0.8  0.3 - 1.2 mg/dL Final   GFR, Estimated 01/15/2023 >60  >60 mL/min Final   Comment: (NOTE) Calculated using the CKD-EPI Creatinine Equation (2021)    Anion gap 01/15/2023 8  5 - 15 Final   Performed at [facility], [address], [place], [place] [address]  Admission on 01/13/2023, Discharged on 01/13/2023  Component Date Value Ref Range Status   WBC 01/13/2023 8.7  4.0 - 10.5 K/uL Final   RBC 01/13/2023 4.79  4.22 - 5.81 MIL/uL Final   Hemoglobin 01/13/2023 15.7  13.0 - 17.0 g/dL Final   HCT 72/53/6644 44.0  39.0 - 52.0 % Final   MCV 01/13/2023 91.9  80.0 - 100.0 fL Final   MCH 01/13/2023 32.8  26.0 - 34.0 pg Final   MCHC 01/13/2023 35.7  30.0 - 36.0 g/dL Final   RDW 03/47/4259 12.5  11.5 - 15.5 % Final   Platelets 01/13/2023 239  150 - 400 K/uL Final   nRBC 01/13/2023 0.0  0.0 - 0.2 % Final   Neutrophils Relative % 01/13/2023 55  % Final   Neutro Abs 01/13/2023 4.8  1.7 - 7.7 K/uL Final   Lymphocytes Relative 01/13/2023 35  % Final   Lymphs Abs 01/13/2023 3.1  0.7 - 4.0 K/uL Final   Monocytes Relative 01/13/2023 8  % Final   Monocytes Absolute 01/13/2023 0.7  0.1 - 1.0 K/uL Final   Eosinophils Relative 01/13/2023 1  % Final   Eosinophils Absolute 01/13/2023 0.1  0.0 - 0.5 K/uL Final   Basophils Relative 01/13/2023 1  % Final   Basophils Absolute 01/13/2023 0.1  0.0 - 0.1 K/uL Final   Immature Granulocytes 01/13/2023 0  % Final   Abs Immature Granulocytes 01/13/2023 0.03  0.00 - 0.07 K/uL Final   Performed at [facility], [address], [place], [place] [address]   Sodium 01/13/2023 136  135 - 145 mmol/L Final   Potassium 01/13/2023 3.2 (L)  3.5 - 5.1 mmol/L Final   Chloride 01/13/2023 101  98 - 111 mmol/L Final   CO2 01/13/2023 24  22 - 32 mmol/L Final  Glucose, Bld 01/13/2023 130 (H)  70 - 99 mg/dL Final   Glucose reference range applies only to samples taken after fasting for at least 8 hours.   BUN 01/13/2023 8  6 -  20 mg/dL Final   Creatinine, Ser 01/13/2023 0.66  0.61 - 1.24 mg/dL Final   Calcium 86/57/8469 8.3 (L)  8.9 - 10.3 mg/dL Final   Total Protein 62/95/2841 8.2 (H)  6.5 - 8.1 g/dL Final   Albumin 32/44/0102 4.1  3.5 - 5.0 g/dL Final   AST 72/53/6644 405 (H)  15 - 41 U/L Final   ALT 01/13/2023 247 (H)  0 - 44 U/L Final   Alkaline Phosphatase 01/13/2023 120  38 - 126 U/L Final   Total Bilirubin 01/13/2023 1.0  0.3 - 1.2 mg/dL Final   GFR, Estimated 01/13/2023 >60  >60 mL/min Final   Comment: (NOTE) Calculated using the CKD-EPI Creatinine Equation (2021)    Anion gap 01/13/2023 11  5 - 15 Final   Performed at [facility], [address] [place], [place] [address]   Alcohol, Ethyl (B) 01/13/2023 163 (H)  <10 mg/dL Final   Comment: (NOTE) Lowest detectable limit for serum alcohol is 10 mg/dL.  For medical purposes only. Performed at [facility], [address], [place], [place] [address]    Opiates 01/13/2023 NONE DETECTED  NONE DETECTED Final   Cocaine 01/13/2023 POSITIVE (A)  NONE DETECTED Final   Benzodiazepines 01/13/2023 NONE DETECTED  NONE DETECTED Final   Amphetamines 01/13/2023 POSITIVE (A)  NONE DETECTED Final   Tetrahydrocannabinol 01/13/2023 POSITIVE (A)  NONE DETECTED Final   Barbiturates 01/13/2023 NONE DETECTED  NONE DETECTED Final   Comment: (NOTE) DRUG SCREEN FOR MEDICAL PURPOSES ONLY.  IF CONFIRMATION IS NEEDED FOR ANY PURPOSE, NOTIFY LAB WITHIN 5 DAYS.  LOWEST DETECTABLE LIMITS FOR URINE DRUG SCREEN Drug Class                     Cutoff (ng/mL) Amphetamine and metabolites    1000 Barbiturate and metabolites    200 Benzodiazepine                 200 Opiates and metabolites        300 Cocaine and metabolites        300 THC                            50 Performed at [facility], [address] [place], [place] [address]    SARS Coronavirus 2 by RT PCR 01/13/2023 NEGATIVE  NEGATIVE Final   Comment:  (NOTE) SARS-CoV-2 target nucleic acids are NOT DETECTED.  The SARS-CoV-2 RNA is generally detectable in upper and lower respiratory specimens during the acute phase of infection. The lowest concentration of SARS-CoV-2 viral copies this assay can detect is 250 copies / mL. A negative result does not preclude SARS-CoV-2 infection and should not be used as the sole basis for treatment or other patient management decisions.  A negative result may occur with improper specimen collection / handling, submission of specimen other than nasopharyngeal swab, presence of viral mutation(s) within the areas targeted by this assay, and inadequate number of viral copies (<250 copies / mL). A negative result must be combined with clinical observations, patient history, and epidemiological information.  Fact Sheet for Patients:   RoadLapTop.co.za  Fact Sheet for Healthcare Providers: http://kim-miller.com/  This test is not yet approved  or                           cleared by the [place] and has been authorized for detection and/or diagnosis of SARS-CoV-2 by FDA under an Emergency Use Authorization (EUA).  This EUA will remain in effect (meaning this test can be used) for the duration of the COVID-19 declaration under Section 564(b)(1) of the Act, 21 U.S.C. section 360bbb-3(b)(1), unless the authorization is terminated or revoked sooner.  Performed at [facility], [address] [place], [place] [address]    TSH 01/13/2023 0.745  0.350 - 4.500 uIU/mL Final   Comment: Performed by a 3rd Generation assay with a functional sensitivity of <=0.01 uIU/mL. Performed at  [facility], [address] [place], [place] [address]    Hgb A1c MFr Bld 01/13/2023 5.5  4.8 - 5.6 % Final   Comment: (NOTE) Pre diabetes:          5.7%-6.4%  Diabetes:              >6.4%  Glycemic control for   <7.0% adults with  diabetes    Mean Plasma Glucose 01/13/2023 111.15  mg/dL Final   Performed at [facility], [address], [place], [place] [address]   Cholesterol 01/13/2023 138  0 - 200 mg/dL Final   Triglycerides 91/47/8295 84  <150 mg/dL Final   HDL 62/13/0865 63  >40 mg/dL Final   Total CHOL/HDL Ratio 01/13/2023 2.2  RATIO Final   VLDL 01/13/2023 17  0 - 40 mg/dL Final   LDL Cholesterol 01/13/2023 58  0 - 99 mg/dL Final   Comment:        Total Cholesterol/HDL:CHD Risk Coronary Heart Disease Risk Table                     Men   Women  1/2 Average Risk   3.4   3.3  Average Risk       5.0   4.4  2 X Average Risk   9.6   7.1  3 X Average Risk  23.4   11.0        Use the calculated Patient Ratio above and the CHD Risk Table to determine the patient's CHD Risk.        ATP III CLASSIFICATION (LDL):  <100     mg/dL   Optimal  784-696  mg/dL   Near or Above                    Optimal  130-159  mg/dL   Borderline  295-284  mg/dL   High  >132     mg/dL   Very High Performed at [facility], [address], [place], [place] [address]     Allergies: Codeine  Medications:  Facility Ordered Medications  Medication   acetaminophen (TYLENOL) tablet 650 mg   alum & mag hydroxide-simeth (MAALOX/MYLANTA) 200-200-20 MG/5ML suspension 30 mL   magnesium hydroxide (MILK OF MAGNESIA) suspension 30 mL   traZODone (DESYREL) tablet 50 mg   dicyclomine (BENTYL) tablet 20 mg   hydrOXYzine (ATARAX) tablet 25 mg   loperamide (IMODIUM) capsule 2-4 mg   methocarbamol (ROBAXIN) tablet 500 mg   naproxen (NAPROSYN) tablet 500 mg   ondansetron (ZOFRAN-ODT) disintegrating tablet 4 mg   PTA Medications  Medication Sig   ibuprofen (ADVIL) 200 MG tablet Take 200 mg by mouth  as needed for moderate pain or headache.   potassium chloride SA (KLOR-CON M) 20 MEQ tablet Take 1 tablet (20 mEq total) by mouth daily for 7 days.      Medical Decision Making  Patient will be admitted to [facility]  for continuous assessment will follow up by psychiatry. Discussed restarting Zyprexa 5mg /day for hallucintaion and patient is agreeable.  -Zyprexa 5mg /day Initiate clonidine withdrawal protocol -clonidine taper -hydroxyzine 25 mg PO every 6 hours prn for anxiety -ondansetron 4 mg ODT every 6 hours prn nausea/vomiting -naproxen 500 mg BID prn for pain -dicyclomine 20 mg PO every 6 hours prn abdominal cramping -loperamide 2-4 mg capsule prn diarrhea -methocarbamol 500 mg PO every 8 hours prn spasms     Recommendations  Based on my evaluation the patient does not appear to have an emergency medical condition.  Maricela Bo, NP 06/03/23  4:38 AM

## 2023-06-03 NOTE — ED Notes (Signed)
Patient in milieu. Environment is secured. Will continue to monitor for safety. 

## 2023-06-03 NOTE — ED Notes (Signed)
Notified provider that patients temp was 99.9 . It was rechecked went up to 100.2 . Rn is going to give medication for the fever.

## 2023-06-03 NOTE — ED Notes (Signed)
Pt A&O x 4, no distress noted, presents with passive SI no plan noted.  Pt has numerous marker markings to body.  Denies HI.  Monitoring for safety.

## 2023-06-03 NOTE — Progress Notes (Signed)
   06/03/23 0317  [facility] Triage Screening (Walk-ins at [facility] only)  How Did You Hear About [place]? Legal System  What Is the Reason for Your Visit/Call Today? Patient presents to [facility] voluntarily by gpd. Patient reports being downtown and noone would let him use his phone to call EMS so he hopped on top of a police car to get help. Patient reports visual and auditory hallucinations. Patient reports passive SI with no plan. Patient does report past self harm. Patient denies HI at this time. Patient reports being homeless and staying at the [facility]. Patient does report frequent alcohol use. Patient reports prescription medications for hallucinations but does not take them. Patient is urgent.  How Long Has This Been Causing You Problems? > than 6 months  Have You Recently Had Any Thoughts About Hurting Yourself? Yes  How long ago did you have thoughts about hurting yourself? frequently  Are You Planning to Commit Suicide/Harm Yourself At This time? No  Have you Recently Had Thoughts About Hurting Someone [name]? No  Are you currently experiencing any auditory, visual or other hallucinations? Yes  Please explain the hallucinations you are currently experiencing: Auditroy and visual  Have You Used Any Alcohol or Drugs in the Past 24 Hours? Yes  How long ago did you use Drugs or Alcohol? Yesterday night at 10pm  What Did You Use and How Much? Alcohol/ unable to report an amount if consumption  Do you have any current medical co-morbidities that require immediate attention? No  Clinician description of patient physical appearance/behavior: Patient is farily groomed with sharpie writing on is arms/body. Patient is intoxiated however is calm and cooperative.  What Do You Feel Would Help You the Most Today? Treatment for Depression or other mood problem;Alcohol or Drug Use Treatment;Medication(s)  If access to [facility] was not available, would you have sought care in the Emergency Department? Yes  Determination of  Need Urgent (48 hours)  Options For Referral Mobile Crisis;Medication Management

## 2023-06-03 NOTE — ED Notes (Signed)
Pharmacy states that it is ok to give the regular 650mg  tylenol . Patient also states that he takes regular tylenol with out any issues.

## 2023-06-04 DIAGNOSIS — F1994 Other psychoactive substance use, unspecified with psychoactive substance-induced mood disorder: Secondary | ICD-10-CM | POA: Diagnosis not present

## 2023-06-04 NOTE — ED Notes (Signed)
Patient  sleeping in no acute stress. RR even and unlabored .Environment secured .Will continue to monitor for safely. 

## 2023-06-04 NOTE — ED Notes (Signed)
Patient in milieu. Environment is secured. Will continue to monitor for safety. 

## 2023-06-04 NOTE — ED Notes (Signed)
Pt observed/assessed in recliner sleeping. RR even and unlabored, appearing in no noted distress. Environmental check complete, will continue to monitor for safety 

## 2023-06-04 NOTE — ED Notes (Signed)
Patient a&o x3. Denies SI/HI/ endorses AVH,Denies withdrawal sx now..Denies intent or plan  to harm self or others. Routine conducted according to faculty protocol .Encourge patient to notify  staff with any needs or concerns. Patient verbalized agreement and understaffing.Will continue to monitor for safety.

## 2023-06-04 NOTE — ED Provider Notes (Signed)
Behavioral Health Progress Note  Date and Time: 06/04/2023 1245pm Name: Mark Hammond MRN:  [id number]   HPI: Mark Hammond is a 32 year old male with a psychiatric history of polysubstance abuse and schizoaffective disorder.  Patient was brought to the [facility] voluntarily by law enforcement on 06/03/2023 due to hallucinations.  Patient was admitted to the continuous assessment unit for overnight observation.   Mark Hammond, 32 y.o., male patient seen face to face by this provider and chart reviewed on 06/04/2023  Subjective  On evaluation Mark Hammond is observed laying in his bed awake.  He continues to endorse depression and passive suicidal ideations.  States that he is feeling better overall.  His thought process is more logical today.  He denies any concerns with appetite or sleep while he has been on the unit.  He has been compliant with medications.  He has required no as needed medications for agitation.  He is denying any homicidal ideations.  He continues to endorse auditory hallucinations but states they are improved.  He continues to hear his own voice and voices of other people.  He is denying any visual hallucinations.  He does not appear to be responding to internal/external stimuli.  Patient continues to be recommended for inpatient psychiatric admission.  Patient has been faxed out  Diagnosis:  Final diagnoses:  Substance induced mood disorder (HCC)  Schizoaffective disorder, unspecified type (HCC)    Total Time spent with patient: 20 minutes  Past Psychiatric History: see H&P Past Medical History:  see H&P Family History:  see H&P Family Psychiatric  History:  see H&P Social History:  see H&P  Additional Social History:    Pain Medications: None Prescriptions: None Over the Counter: Tylenol, benedryl History of alcohol / drug use?: Yes Longest period of sobriety (when/how long): Unable to Quantify Negative Consequences of Use: Financial, Legal, Personal relationships,  Work / School Withdrawal Symptoms: Irritability Name of Substance 1: Alcohol 1 - Age of First Use: unknown 1 - Amount (size/oz): "a beer" 1 - Frequency: daily 1 - Last Use / Amount: "last night" - 06/02/23 Name of Substance 2: cocaine 2 - Age of First Use: unknown 2 - Amount (size/oz): unknown 2 - Frequency: 2-3 times a week 2 - Last Use / Amount: 06/02/23                Sleep: Good  Appetite:  Fair  Current Medications:  Current Facility-Administered Medications  Medication Dose Route Frequency Provider Last Rate Last Admin   acetaminophen (TYLENOL) tablet 650 mg  650 mg Oral Q6H PRN Ajibola, Ene A, NP   650 mg at 06/03/23 1848   alum & mag hydroxide-simeth (MAALOX/MYLANTA) 200-200-20 MG/5ML suspension 30 mL  30 mL Oral Q4H PRN Ajibola, Ene A, NP       dicyclomine (BENTYL) tablet 20 mg  20 mg Oral Q6H PRN Ajibola, Ene A, NP       hydrOXYzine (ATARAX) tablet 25 mg  25 mg Oral Q6H PRN Ardis Hughs, NP   25 mg at 06/03/23 2124   ibuprofen (ADVIL) tablet 600 mg  600 mg Oral Q6H PRN Oneta Rack, NP       loperamide (IMODIUM) capsule 2-4 mg  2-4 mg Oral PRN Ajibola, Ene A, NP       LORazepam (ATIVAN) tablet 1 mg  1 mg Oral Q6H PRN Ardis Hughs, NP       magnesium hydroxide (MILK OF MAGNESIA) suspension 30 mL  30 mL Oral Daily PRN  Ajibola, Ene A, NP       methocarbamol (ROBAXIN) tablet 500 mg  500 mg Oral Q8H PRN Ajibola, Ene A, NP   500 mg at 06/03/23 2124   multivitamin with minerals tablet 1 tablet  1 tablet Oral Daily Ardis Hughs, NP   1 tablet at 06/04/23 0959   OLANZapine zydis (ZYPREXA) disintegrating tablet 5 mg  5 mg Oral BID Ardis Hughs, NP   5 mg at 06/04/23 0959   ondansetron (ZOFRAN-ODT) disintegrating tablet 4 mg  4 mg Oral Q6H PRN Ajibola, Ene A, NP       thiamine (VITAMIN B1) tablet 100 mg  100 mg Oral Daily Vernard Gambles H, NP   100 mg at 06/04/23 1000   traZODone (DESYREL) tablet 50 mg  50 mg Oral QHS PRN Ajibola, Ene A, NP   50 mg at  06/03/23 2124   No current outpatient medications on file.    Labs  Lab Results:  Admission on 06/03/2023  Component Date Value Ref Range Status   WBC 06/03/2023 6.4  4.0 - 10.5 K/uL Final   RBC 06/03/2023 3.88 (L)  4.22 - 5.81 MIL/uL Final   Hemoglobin 06/03/2023 13.3  13.0 - 17.0 g/dL Final   HCT 09/81/1914 38.0 (L)  39.0 - 52.0 % Final   MCV 06/03/2023 97.9  80.0 - 100.0 fL Final   MCH 06/03/2023 34.3 (H)  26.0 - 34.0 pg Final   MCHC 06/03/2023 35.0  30.0 - 36.0 g/dL Final   RDW 78/29/5621 13.0  11.5 - 15.5 % Final   Platelets 06/03/2023 233  150 - 400 K/uL Final   nRBC 06/03/2023 0.0  0.0 - 0.2 % Final   Neutrophils Relative % 06/03/2023 49  % Final   Neutro Abs 06/03/2023 3.2  1.7 - 7.7 K/uL Final   Lymphocytes Relative 06/03/2023 40  % Final   Lymphs Abs 06/03/2023 2.6  0.7 - 4.0 K/uL Final   Monocytes Relative 06/03/2023 7  % Final   Monocytes Absolute 06/03/2023 0.4  0.1 - 1.0 K/uL Final   Eosinophils Relative 06/03/2023 2  % Final   Eosinophils Absolute 06/03/2023 0.1  0.0 - 0.5 K/uL Final   Basophils Relative 06/03/2023 2  % Final   Basophils Absolute 06/03/2023 0.1  0.0 - 0.1 K/uL Final   Immature Granulocytes 06/03/2023 0  % Final   Abs Immature Granulocytes 06/03/2023 0.02  0.00 - 0.07 K/uL Final   Performed at [facility], [address], [place], [place] [address]   Sodium 06/03/2023 140  135 - 145 mmol/L Final   Potassium 06/03/2023 3.4 (L)  3.5 - 5.1 mmol/L Final   Chloride 06/03/2023 104  98 - 111 mmol/L Final   CO2 06/03/2023 26  22 - 32 mmol/L Final   Glucose, Bld 06/03/2023 112 (H)  70 - 99 mg/dL Final   Glucose reference range applies only to samples taken after fasting for at least 8 hours.   BUN 06/03/2023 5 (L)  6 - 20 mg/dL Final   Creatinine, Ser 06/03/2023 1.04  0.61 - 1.24 mg/dL Final   Calcium 78/46/9629 8.3 (L)  8.9 - 10.3 mg/dL Final   Total Protein 52/84/1324 7.0  6.5 - 8.1 g/dL Final   Albumin 40/07/2724 3.2 (L)  3.5 - 5.0 g/dL Final    AST 36/64/4034 258 (H)  15 - 41 U/L Final   ALT 06/03/2023 186 (H)  0 - 44 U/L Final   Alkaline Phosphatase 06/03/2023 107  38 - 126 U/L Final   Total Bilirubin 06/03/2023 0.8  0.3 - 1.2 mg/dL Final   GFR, Estimated 06/03/2023 >60  >60 mL/min Final   Comment: (NOTE) Calculated using the CKD-EPI Creatinine Equation (2021)    Anion gap 06/03/2023 10  5 - 15 Final   Performed at [facility], [address], [place], [place] [address]   Alcohol, Ethyl (B) 06/03/2023 285 (H)  <10 mg/dL Final   Comment: (NOTE) Lowest detectable limit for serum alcohol is 10 mg/dL.  For medical purposes only. Performed at [facility], [address] [place], [place] [address]    POC Amphetamine UR 06/03/2023 None Detected  NONE DETECTED (Cut Off Level 1000 ng/mL) Final   POC Secobarbital (BAR) 06/03/2023 None Detected  NONE DETECTED (Cut Off Level 300 ng/mL) Final   POC Buprenorphine (BUP) 06/03/2023 None Detected  NONE DETECTED (Cut Off Level 10 ng/mL) Final   POC Oxazepam (BZO) 06/03/2023 None Detected  NONE DETECTED (Cut Off Level 300 ng/mL) Final   POC Cocaine UR 06/03/2023 Positive (A)  NONE DETECTED (Cut Off Level 300 ng/mL) Final   POC Methamphetamine UR 06/03/2023 None Detected  NONE DETECTED (Cut Off Level 1000 ng/mL) Final   POC Morphine 06/03/2023 Positive (A)  NONE DETECTED (Cut Off Level 300 ng/mL) Final   POC Methadone UR 06/03/2023 None Detected  NONE DETECTED (Cut Off Level 300 ng/mL) Final   POC Oxycodone UR 06/03/2023 None Detected  NONE DETECTED (Cut Off Level 100 ng/mL) Final   POC Marijuana UR 06/03/2023 Positive (A)  NONE DETECTED (Cut Off Level 50 ng/mL) Final  Admission on 05/28/2023, Discharged on 05/28/2023  Component Date Value Ref Range Status   WBC 05/28/2023 11.2 (H)  4.0 - 10.5 K/uL Final   RBC 05/28/2023 4.18 (L)  4.22 - 5.81 MIL/uL Final   Hemoglobin 05/28/2023 14.3  13.0 - 17.0 g/dL Final   HCT 09/81/1914 41.5  39.0 - 52.0 % Final   MCV 05/28/2023 99.3   80.0 - 100.0 fL Final   MCH 05/28/2023 34.2 (H)  26.0 - 34.0 pg Final   MCHC 05/28/2023 34.5  30.0 - 36.0 g/dL Final   RDW 78/29/5621 13.2  11.5 - 15.5 % Final   Platelets 05/28/2023 244  150 - 400 K/uL Final   nRBC 05/28/2023 0.0  0.0 - 0.2 % Final   Neutrophils Relative % 05/28/2023 65  % Final   Neutro Abs 05/28/2023 7.3  1.7 - 7.7 K/uL Final   Lymphocytes Relative 05/28/2023 27  % Final   Lymphs Abs 05/28/2023 3.0  0.7 - 4.0 K/uL Final   Monocytes Relative 05/28/2023 6  % Final   Monocytes Absolute 05/28/2023 0.7  0.1 - 1.0 K/uL Final   Eosinophils Relative 05/28/2023 1  % Final   Eosinophils Absolute 05/28/2023 0.1  0.0 - 0.5 K/uL Final   Basophils Relative 05/28/2023 1  % Final   Basophils Absolute 05/28/2023 0.1  0.0 - 0.1 K/uL Final   Immature Granulocytes 05/28/2023 0  % Final   Abs Immature Granulocytes 05/28/2023 0.04  0.00 - 0.07 K/uL Final   Performed at [facility], [address], [place], [place] [address]   Sodium 05/28/2023 139  135 - 145 mmol/L Final   Potassium 05/28/2023 3.1 (L)  3.5 - 5.1 mmol/L Final   Chloride 05/28/2023 106  98 - 111 mmol/L Final   CO2 05/28/2023 22  22 - 32 mmol/L Final   Glucose, Bld 05/28/2023 100 (  H)  70 - 99 mg/dL Final   Glucose reference range applies only to samples taken after fasting for at least 8 hours.   BUN 05/28/2023 6  6 - 20 mg/dL Final   Creatinine, Ser 05/28/2023 0.70  0.61 - 1.24 mg/dL Final   Calcium 16/07/9603 8.0 (L)  8.9 - 10.3 mg/dL Final   GFR, Estimated 05/28/2023 >60  >60 mL/min Final   Comment: (NOTE) Calculated using the CKD-EPI Creatinine Equation (2021)    Anion gap 05/28/2023 11  5 - 15 Final   Performed at [facility], [address] [place], [place] [address]   Alcohol, Ethyl (B) 05/28/2023 279 (H)  <10 mg/dL Final   Comment: (NOTE) Lowest detectable limit for serum alcohol is 10 mg/dL.  For medical purposes only. Performed at [facility], [address] [place],  [place] [address]    Opiates 05/28/2023 POSITIVE (A)  NONE DETECTED Final   Cocaine 05/28/2023 POSITIVE (A)  NONE DETECTED Final   Benzodiazepines 05/28/2023 NONE DETECTED  NONE DETECTED Final   Amphetamines 05/28/2023 POSITIVE (A)  NONE DETECTED Final   Tetrahydrocannabinol 05/28/2023 POSITIVE (A)  NONE DETECTED Final   Barbiturates 05/28/2023 NONE DETECTED  NONE DETECTED Final   Comment: (NOTE) DRUG SCREEN FOR MEDICAL PURPOSES ONLY.  IF CONFIRMATION IS NEEDED FOR ANY PURPOSE, NOTIFY LAB WITHIN 5 DAYS.  LOWEST DETECTABLE LIMITS FOR URINE DRUG SCREEN Drug Class                     Cutoff (ng/mL) Amphetamine and metabolites    1000 Barbiturate and metabolites    200 Benzodiazepine                 200 Opiates and metabolites        300 Cocaine and metabolites        300 THC                            50 Performed at [facility], [address] [place], [place] [address]   Admission on 05/12/2023, Discharged on 05/12/2023  Component Date Value Ref Range Status   Sodium 05/12/2023 133 (L)  135 - 145 mmol/L Final   Potassium 05/12/2023 3.1 (L)  3.5 - 5.1 mmol/L Final   Chloride 05/12/2023 97 (L)  98 - 111 mmol/L Final   CO2 05/12/2023 21 (L)  22 - 32 mmol/L Final   Glucose, Bld 05/12/2023 101 (H)  70 - 99 mg/dL Final   Glucose reference range applies only to samples taken after fasting for at least 8 hours.   BUN 05/12/2023 7  6 - 20 mg/dL Final   Creatinine, Ser 05/12/2023 0.61  0.61 - 1.24 mg/dL Final   Calcium 62/13/0865 8.7 (L)  8.9 - 10.3 mg/dL Final   Total Protein 78/46/9629 8.1  6.5 - 8.1 g/dL Final   Albumin 52/84/1324 3.6  3.5 - 5.0 g/dL Final   AST 40/07/2724 566 (H)  15 - 41 U/L Final   ALT 05/12/2023 436 (H)  0 - 44 U/L Final   Alkaline Phosphatase 05/12/2023 127 (H)  38 - 126 U/L Final   Total Bilirubin 05/12/2023 1.3 (H)  0.3 - 1.2 mg/dL Final   GFR, Estimated 05/12/2023 >60  >60 mL/min Final   Comment: (NOTE) Calculated using the CKD-EPI Creatinine Equation  (2021)    Anion gap 05/12/2023 15  5 - 15 Final  Performed at [facility], [address] [place], [place] [address]   Alcohol, Ethyl (B) 05/12/2023 <10  <10 mg/dL Final   Comment: (NOTE) Lowest detectable limit for serum alcohol is 10 mg/dL.  For medical purposes only. Performed at [facility], [address], [place], [place] [address]    Opiates 05/12/2023 NONE DETECTED  NONE DETECTED Final   Cocaine 05/12/2023 POSITIVE (A)  NONE DETECTED Final   Benzodiazepines 05/12/2023 NONE DETECTED  NONE DETECTED Final   Amphetamines 05/12/2023 POSITIVE (A)  NONE DETECTED Final   Tetrahydrocannabinol 05/12/2023 NONE DETECTED  NONE DETECTED Final   Barbiturates 05/12/2023 NONE DETECTED  NONE DETECTED Final   Comment: (NOTE) DRUG SCREEN FOR MEDICAL PURPOSES ONLY.  IF CONFIRMATION IS NEEDED FOR ANY PURPOSE, NOTIFY LAB WITHIN 5 DAYS.  LOWEST DETECTABLE LIMITS FOR URINE DRUG SCREEN Drug Class                     Cutoff (ng/mL) Amphetamine and metabolites    1000 Barbiturate and metabolites    200 Benzodiazepine                 200 Opiates and metabolites        300 Cocaine and metabolites        300 THC                            50 Performed at [facility], [address] [place], [place] [address]    WBC 05/12/2023 6.8  4.0 - 10.5 K/uL Final   RBC 05/12/2023 4.77  4.22 - 5.81 MIL/uL Final   Hemoglobin 05/12/2023 15.7  13.0 - 17.0 g/dL Final   HCT 21/30/8657 45.3  39.0 - 52.0 % Final   MCV 05/12/2023 95.0  80.0 - 100.0 fL Final   MCH 05/12/2023 32.9  26.0 - 34.0 pg Final   MCHC 05/12/2023 34.7  30.0 - 36.0 g/dL Final   RDW 84/69/6295 13.0  11.5 - 15.5 % Final   Platelets 05/12/2023 194  150 - 400 K/uL Final   nRBC 05/12/2023 0.0  0.0 - 0.2 % Final   Neutrophils Relative % 05/12/2023 73  % Final   Neutro Abs 05/12/2023 5.0  1.7 - 7.7 K/uL Final   Lymphocytes Relative 05/12/2023 17  % Final   Lymphs Abs 05/12/2023 1.1  0.7 - 4.0 K/uL Final   Monocytes  Relative 05/12/2023 8  % Final   Monocytes Absolute 05/12/2023 0.5  0.1 - 1.0 K/uL Final   Eosinophils Relative 05/12/2023 1  % Final   Eosinophils Absolute 05/12/2023 0.1  0.0 - 0.5 K/uL Final   Basophils Relative 05/12/2023 1  % Final   Basophils Absolute 05/12/2023 0.1  0.0 - 0.1 K/uL Final   Immature Granulocytes 05/12/2023 0  % Final   Abs Immature Granulocytes 05/12/2023 0.03  0.00 - 0.07 K/uL Final   Performed at [facility], [address], [place], [place] [address]   SARS Coronavirus 2 by RT PCR 05/12/2023 NEGATIVE  NEGATIVE Final   Performed at [facility], [address] [place], [place] [address]   Total CK 05/12/2023 438 (H)  49 - 397 U/L Final   Performed at [facility], [address] [place], [place] [address]  Admission on 03/05/2023, Discharged on 03/05/2023  Component Date Value Ref Range Status   Sodium 03/05/2023 137  135 -  145 mmol/L Final   Potassium 03/05/2023 3.2 (L)  3.5 - 5.1 mmol/L Final   Chloride 03/05/2023 102  98 - 111 mmol/L Final   CO2 03/05/2023 24  22 - 32 mmol/L Final   Glucose, Bld 03/05/2023 209 (H)  70 - 99 mg/dL Final   Glucose reference range applies only to samples taken after fasting for at least 8 hours.   BUN 03/05/2023 10  6 - 20 mg/dL Final   Creatinine, Ser 03/05/2023 0.78  0.61 - 1.24 mg/dL Final   Calcium 82/95/6213 8.1 (L)  8.9 - 10.3 mg/dL Final   Total Protein 08/65/7846 7.0  6.5 - 8.1 g/dL Final   Albumin 96/29/5284 3.1 (L)  3.5 - 5.0 g/dL Final   AST 13/24/4010 287 (H)  15 - 41 U/L Final   ALT 03/05/2023 261 (H)  0 - 44 U/L Final   Alkaline Phosphatase 03/05/2023 140 (H)  38 - 126 U/L Final   Total Bilirubin 03/05/2023 0.7  0.3 - 1.2 mg/dL Final   GFR, Estimated 03/05/2023 >60  >60 mL/min Final   Comment: (NOTE) Calculated using the CKD-EPI Creatinine Equation (2021)    Anion gap 03/05/2023 11  5 - 15 Final   Performed at [facility], [address] [place], [place] [address]   Alcohol, Ethyl  (B) 03/05/2023 171 (H)  <10 mg/dL Final   Comment: (NOTE) Lowest detectable limit for serum alcohol is 10 mg/dL.  For medical purposes only. Performed at [name], [address] [place], [place] [address]    WBC 03/05/2023 11.4 (H)  4.0 - 10.5 K/uL Final   RBC 03/05/2023 4.20 (L)  4.22 - 5.81 MIL/uL Final   Hemoglobin 03/05/2023 13.9  13.0 - 17.0 g/dL Final   HCT 34/74/2595 40.4  39.0 - 52.0 % Final   MCV 03/05/2023 96.2  80.0 - 100.0 fL Final   MCH 03/05/2023 33.1  26.0 - 34.0 pg Final   MCHC 03/05/2023 34.4  30.0 - 36.0 g/dL Final   RDW 63/87/5643 13.8  11.5 - 15.5 % Final   Platelets 03/05/2023 392  150 - 400 K/uL Final   nRBC 03/05/2023 0.0  0.0 - 0.2 % Final   Performed at [facility], [address], [place], [place] [address]   Opiates 03/05/2023 NONE DETECTED  NONE DETECTED Final   Cocaine 03/05/2023 POSITIVE (A)  NONE DETECTED Final   Benzodiazepines 03/05/2023 POSITIVE (A)  NONE DETECTED Final   Amphetamines 03/05/2023 NONE DETECTED  NONE DETECTED Final   Tetrahydrocannabinol 03/05/2023 POSITIVE (A)  NONE DETECTED Final   Barbiturates 03/05/2023 NONE DETECTED  NONE DETECTED Final   Comment: (NOTE) DRUG SCREEN FOR MEDICAL PURPOSES ONLY.  IF CONFIRMATION IS NEEDED FOR ANY PURPOSE, NOTIFY LAB WITHIN 5 DAYS.  LOWEST DETECTABLE LIMITS FOR URINE DRUG SCREEN Drug Class                     Cutoff (ng/mL) Amphetamine and metabolites    1000 Barbiturate and metabolites    200 Benzodiazepine                 200 Opiates and metabolites        300 Cocaine and metabolites        300 THC                            50 Performed at [facility], [address] [place], [place] [address]   Admission on 02/23/2023, Discharged on 03/01/2023  Component Date Value Ref Range Status   Cholesterol 02/24/2023 147  0 - 200 mg/dL Final   Triglycerides 09/81/1914 96  <150 mg/dL Final   HDL 78/29/5621 42  >40 mg/dL Final   Total CHOL/HDL Ratio 02/24/2023 3.5   RATIO Final   VLDL 02/24/2023 19  0 - 40 mg/dL Final   LDL Cholesterol 02/24/2023 86  0 - 99 mg/dL Final   Comment:        Total Cholesterol/HDL:CHD Risk Coronary Heart Disease Risk Table                     Men   Women  1/2 Average Risk   3.4   3.3  Average Risk       5.0   4.4  2 X Average Risk   9.6   7.1  3 X Average Risk  23.4   11.0        Use the calculated Patient Ratio above and the CHD Risk Table to determine the patient's CHD Risk.        ATP III CLASSIFICATION (LDL):  <100     mg/dL   Optimal  308-657  mg/dL   Near or Above                    Optimal  130-159  mg/dL   Borderline  846-962  mg/dL   High  >952     mg/dL   Very High Performed at [facility], [address] [place], [place] [address]    Hgb A1c MFr Bld 02/24/2023 5.5  4.8 - 5.6 % Final   Comment: (NOTE)         Prediabetes: 5.7 - 6.4         Diabetes: >6.4         Glycemic control for adults with diabetes: <7.0    Mean Plasma Glucose 02/24/2023 111  mg/dL Final   Comment: (NOTE) Performed At: [facility] [address] [place], [place] [id number] Jolene Schimke MD DG:[id number]    Hepatitis B Surface Ag 02/25/2023 NON REACTIVE  NON REACTIVE Final   HCV Ab 02/25/2023 Reactive (A)  NON REACTIVE Final   Comment: (NOTE) The CDC recommends that a Reactive HCV antibody result be followed up  with a HCV Nucleic Acid Amplification test.     Hep A IgM 02/25/2023 NON REACTIVE  NON REACTIVE Final   Hep B C IgM 02/25/2023 NON REACTIVE  NON REACTIVE Final   Performed at [facility], [address], [place], [place] [address]   Potassium 02/25/2023 4.4  3.5 - 5.1 mmol/L Final   Performed at [facility], [address], [place], [place] [address]   HCV RNA, Quantitation 02/27/2023 See Final Results  IU/mL Final   Test Information: 02/27/2023 Comment   Final   Comment: (NOTE) The quantitative range of this assay is 15 IU/mL to 100  million IU/mL. Performed At: [facility] [address] [place], [place] [id number] Jolene Schimke MD YS:[id number]    Sodium 02/27/2023 138  135 - 145 mmol/L Final   Potassium 02/27/2023 4.1  3.5 - 5.1 mmol/L Final   Chloride 02/27/2023 102  98 - 111 mmol/L Final   CO2 02/27/2023 28  22 - 32 mmol/L Final   Glucose, Bld 02/27/2023 96  70 - 99 mg/dL Final   Glucose reference  range applies only to samples taken after fasting for at least 8 hours.   BUN 02/27/2023 9  6 - 20 mg/dL Final   Creatinine, Ser 02/27/2023 0.75  0.61 - 1.24 mg/dL Final   Calcium 40/98/1191 8.4 (L)  8.9 - 10.3 mg/dL Final   Total Protein 47/82/9562 7.1  6.5 - 8.1 g/dL Final   Albumin 13/05/6577 2.9 (L)  3.5 - 5.0 g/dL Final   AST 46/96/2952 168 (H)  15 - 41 U/L Final   ALT 02/27/2023 193 (H)  0 - 44 U/L Final   Alkaline Phosphatase 02/27/2023 97  38 - 126 U/L Final   Total Bilirubin 02/27/2023 0.5  0.3 - 1.2 mg/dL Final   GFR, Estimated 02/27/2023 >60  >60 mL/min Final   Comment: (NOTE) Calculated using the CKD-EPI Creatinine Equation (2021)    Anion gap 02/27/2023 8  5 - 15 Final   Performed at [facility], [address], [place], [place] [address]   HCV RNA - Quantitation 02/27/2023 6,550,000  IU/mL Final   Comment: (NOTE)                HCV RNA detected HCV RNA viral loads >/= 25 IU/mL indicate current HCV infection.    HCV RNA - log10 02/27/2023 6.816  log10 IU/mL Final   Comment: (NOTE) Performed At: [facility] [address] [place], [place] [id number] Jolene Schimke MD DG:[id number]   Admission on 02/23/2023, Discharged on 02/23/2023  Component Date Value Ref Range Status   Acetaminophen (Tylenol), Serum 02/23/2023 <10 (L)  10 - 30 ug/mL Final   Comment: (NOTE) Therapeutic concentrations vary significantly. A range of 10-30 ug/mL  may be an effective concentration for many patients. However, some  are best treated at concentrations outside of this  range. Acetaminophen concentrations >150 ug/mL at 4 hours after ingestion  and >50 ug/mL at 12 hours after ingestion are often associated with  toxic reactions.  Performed at [facility], [address], [place], [place] [address]    Sodium 02/23/2023 138  135 - 145 mmol/L Final   Potassium 02/23/2023 3.1 (L)  3.5 - 5.1 mmol/L Final   Chloride 02/23/2023 103  98 - 111 mmol/L Final   CO2 02/23/2023 24  22 - 32 mmol/L Final   Glucose, Bld 02/23/2023 115 (H)  70 - 99 mg/dL Final   Glucose reference range applies only to samples taken after fasting for at least 8 hours.   BUN 02/23/2023 9  6 - 20 mg/dL Final   Creatinine, Ser 02/23/2023 0.75  0.61 - 1.24 mg/dL Final   Calcium 75/64/3329 8.2 (L)  8.9 - 10.3 mg/dL Final   Total Protein 51/88/4166 7.6  6.5 - 8.1 g/dL Final   Albumin 04/01/1600 3.3 (L)  3.5 - 5.0 g/dL Final   AST 09/32/3557 168 (H)  15 - 41 U/L Final   ALT 02/23/2023 138 (H)  0 - 44 U/L Final   Alkaline Phosphatase 02/23/2023 127 (H)  38 - 126 U/L Final   Total Bilirubin 02/23/2023 0.6  0.3 - 1.2 mg/dL Final   GFR, Estimated 02/23/2023 >60  >60 mL/min Final   Comment: (NOTE) Calculated using the CKD-EPI Creatinine Equation (2021)    Anion gap 02/23/2023 11  5 - 15 Final   Performed at [facility], [address] [place], [place] [address]   Alcohol, Ethyl (B) 02/23/2023 107 (H)  <10 mg/dL Final   Comment: (NOTE) Lowest detectable limit for serum  alcohol is 10 mg/dL.  For medical purposes only. Performed at [facility], [address], [place], [place] [address]    WBC 02/23/2023 9.3  4.0 - 10.5 K/uL Final   RBC 02/23/2023 4.19 (L)  4.22 - 5.81 MIL/uL Final   Hemoglobin 02/23/2023 13.9  13.0 - 17.0 g/dL Final   HCT 82/95/6213 40.5  39.0 - 52.0 % Final   MCV 02/23/2023 96.7  80.0 - 100.0 fL Final   MCH 02/23/2023 33.2  26.0 - 34.0 pg Final   MCHC 02/23/2023 34.3  30.0 - 36.0 g/dL Final   RDW 08/65/7846  14.3  11.5 - 15.5 % Final   Platelets 02/23/2023 227  150 - 400 K/uL Final   nRBC 02/23/2023 0.0  0.0 - 0.2 % Final   Neutrophils Relative % 02/23/2023 61  % Final   Neutro Abs 02/23/2023 5.6  1.7 - 7.7 K/uL Final   Lymphocytes Relative 02/23/2023 31  % Final   Lymphs Abs 02/23/2023 2.9  0.7 - 4.0 K/uL Final   Monocytes Relative 02/23/2023 6  % Final   Monocytes Absolute 02/23/2023 0.5  0.1 - 1.0 K/uL Final   Eosinophils Relative 02/23/2023 1  % Final   Eosinophils Absolute 02/23/2023 0.1  0.0 - 0.5 K/uL Final   Basophils Relative 02/23/2023 1  % Final   Basophils Absolute 02/23/2023 0.1  0.0 - 0.1 K/uL Final   Immature Granulocytes 02/23/2023 0  % Final   Abs Immature Granulocytes 02/23/2023 0.03  0.00 - 0.07 K/uL Final   Performed at [facility], [address], [place], [place] [address]   Salicylate Lvl 02/23/2023 <7.0 (L)  7.0 - 30.0 mg/dL Final   Performed at [facility], [address], [place], [place] [address]   Opiates 02/23/2023 NONE DETECTED  NONE DETECTED Final   Cocaine 02/23/2023 POSITIVE (A)  NONE DETECTED Final   Benzodiazepines 02/23/2023 NONE DETECTED  NONE DETECTED Final   Amphetamines 02/23/2023 NONE DETECTED  NONE DETECTED Final   Tetrahydrocannabinol 02/23/2023 POSITIVE (A)  NONE DETECTED Final   Barbiturates 02/23/2023 NONE DETECTED  NONE DETECTED Final   Comment: (NOTE) DRUG SCREEN FOR MEDICAL PURPOSES ONLY.  IF CONFIRMATION IS NEEDED FOR ANY PURPOSE, NOTIFY LAB WITHIN 5 DAYS.  LOWEST DETECTABLE LIMITS FOR URINE DRUG SCREEN Drug Class                     Cutoff (ng/mL) Amphetamine and metabolites    1000 Barbiturate and metabolites    200 Benzodiazepine                 200 Opiates and metabolites        300 Cocaine and metabolites        300 THC                            50 Performed at [facility], [address] [place], [place] [address]   Admission on 01/13/2023, Discharged on  01/23/2023  Component Date Value Ref Range Status   Hgb A1c MFr Bld 01/14/2023 5.7 (H)  4.8 - 5.6 % Final   Comment: (NOTE) Pre diabetes:          5.7%-6.4%  Diabetes:              >6.4%  Glycemic control for   <7.0% adults with diabetes    Mean Plasma Glucose 01/14/2023 116.89  mg/dL  Final   Performed at [facility], [address], [place], [place] [address]   Cholesterol 01/14/2023 126  0 - 200 mg/dL Final   Triglycerides 91/47/8295 65  <150 mg/dL Final   HDL 62/13/0865 56  >40 mg/dL Final   Total CHOL/HDL Ratio 01/14/2023 2.3  RATIO Final   VLDL 01/14/2023 13  0 - 40 mg/dL Final   LDL Cholesterol 01/14/2023 57  0 - 99 mg/dL Final   Comment:        Total Cholesterol/HDL:CHD Risk Coronary Heart Disease Risk Table                     Men   Women  1/2 Average Risk   3.4   3.3  Average Risk       5.0   4.4  2 X Average Risk   9.6   7.1  3 X Average Risk  23.4   11.0        Use the calculated Patient Ratio above and the CHD Risk Table to determine the patient's CHD Risk.        ATP III CLASSIFICATION (LDL):  <100     mg/dL   Optimal  784-696  mg/dL   Near or Above                    Optimal  130-159  mg/dL   Borderline  295-284  mg/dL   High  >132     mg/dL   Very High Performed at [facility], [address] [place], [place] [address]    TSH 01/14/2023 1.134  0.350 - 4.500 uIU/mL Final   Comment: Performed by a 3rd Generation assay with a functional sensitivity of <=0.01 uIU/mL. Performed at [facility], [address] [place], [place] [address]    Vit D, 25-Hydroxy 01/15/2023 26.86 (L)  30 - 100 ng/mL Final   Comment: (NOTE) Vitamin D deficiency has been defined by the Institute of Medicine  and an Endocrine Society practice guideline as a level of serum 25-OH  vitamin D less than 20 ng/mL (1,2). The Endocrine Society went on to  further define vitamin D insufficiency as a level between 21 and 29  ng/mL (2).  1. IOM  (Institute of Medicine). 2010. Dietary reference intakes for  calcium and D. [place]: [employer]. 2. Holick MF, Binkley Bossier, Bischoff-Ferrari HA, et al. Evaluation,  treatment, and prevention of vitamin D deficiency: an Endocrine  Society clinical practice guideline, JCEM. 2011 Jul; 96(7): 1911-30.  Performed at [facility], [address] [place], [place] [address]    Sodium 01/15/2023 139  135 - 145 mmol/L Final   Potassium 01/15/2023 3.7  3.5 - 5.1 mmol/L Final   Chloride 01/15/2023 105  98 - 111 mmol/L Final   CO2 01/15/2023 26  22 - 32 mmol/L Final   Glucose, Bld 01/15/2023 126 (H)  70 - 99 mg/dL Final   Glucose reference range applies only to samples taken after fasting for at least 8 hours.   BUN 01/15/2023 10  6 - 20 mg/dL Final   Creatinine, Ser 01/15/2023 0.62  0.61 - 1.24 mg/dL Final   Calcium 34/74/2595 8.9  8.9 - 10.3 mg/dL Final   Total Protein 63/87/5643 7.8  6.5 - 8.1 g/dL Final   Albumin 32/95/1884 3.7  3.5 - 5.0 g/dL Final   AST 16/60/6301 288 (H)  15 - 41 U/L Final  ALT 01/15/2023 211 (H)  0 - 44 U/L Final   Alkaline Phosphatase 01/15/2023 115  38 - 126 U/L Final   Total Bilirubin 01/15/2023 0.8  0.3 - 1.2 mg/dL Final   GFR, Estimated 01/15/2023 >60  >60 mL/min Final   Comment: (NOTE) Calculated using the CKD-EPI Creatinine Equation (2021)    Anion gap 01/15/2023 8  5 - 15 Final   Performed at [facility], [address] [place], [place] [address]  Admission on 01/13/2023, Discharged on 01/13/2023  Component Date Value Ref Range Status   WBC 01/13/2023 8.7  4.0 - 10.5 K/uL Final   RBC 01/13/2023 4.79  4.22 - 5.81 MIL/uL Final   Hemoglobin 01/13/2023 15.7  13.0 - 17.0 g/dL Final   HCT 47/42/5956 44.0  39.0 - 52.0 % Final   MCV 01/13/2023 91.9  80.0 - 100.0 fL Final   MCH 01/13/2023 32.8  26.0 - 34.0 pg Final   MCHC 01/13/2023 35.7  30.0 - 36.0 g/dL Final   RDW 38/75/6433 12.5  11.5 - 15.5 % Final    Platelets 01/13/2023 239  150 - 400 K/uL Final   nRBC 01/13/2023 0.0  0.0 - 0.2 % Final   Neutrophils Relative % 01/13/2023 55  % Final   Neutro Abs 01/13/2023 4.8  1.7 - 7.7 K/uL Final   Lymphocytes Relative 01/13/2023 35  % Final   Lymphs Abs 01/13/2023 3.1  0.7 - 4.0 K/uL Final   Monocytes Relative 01/13/2023 8  % Final   Monocytes Absolute 01/13/2023 0.7  0.1 - 1.0 K/uL Final   Eosinophils Relative 01/13/2023 1  % Final   Eosinophils Absolute 01/13/2023 0.1  0.0 - 0.5 K/uL Final   Basophils Relative 01/13/2023 1  % Final   Basophils Absolute 01/13/2023 0.1  0.0 - 0.1 K/uL Final   Immature Granulocytes 01/13/2023 0  % Final   Abs Immature Granulocytes 01/13/2023 0.03  0.00 - 0.07 K/uL Final   Performed at [facility], [address] [place], [place] [address]   Sodium 01/13/2023 136  135 - 145 mmol/L Final   Potassium 01/13/2023 3.2 (L)  3.5 - 5.1 mmol/L Final   Chloride 01/13/2023 101  98 - 111 mmol/L Final   CO2 01/13/2023 24  22 - 32 mmol/L Final   Glucose, Bld 01/13/2023 130 (H)  70 - 99 mg/dL Final   Glucose reference range applies only to samples taken after fasting for at least 8 hours.   BUN 01/13/2023 8  6 - 20 mg/dL Final   Creatinine, Ser 01/13/2023 0.66  0.61 - 1.24 mg/dL Final   Calcium 84/16/6063 8.3 (L)  8.9 - 10.3 mg/dL Final   Total Protein 01/60/1093 8.2 (H)  6.5 - 8.1 g/dL Final   Albumin 23/55/7322 4.1  3.5 - 5.0 g/dL Final   AST 02/54/2706 405 (H)  15 - 41 U/L Final   ALT 01/13/2023 247 (H)  0 - 44 U/L Final   Alkaline Phosphatase 01/13/2023 120  38 - 126 U/L Final   Total Bilirubin 01/13/2023 1.0  0.3 - 1.2 mg/dL Final   GFR, Estimated 01/13/2023 >60  >60 mL/min Final   Comment: (NOTE) Calculated using the CKD-EPI Creatinine Equation (2021)    Anion gap 01/13/2023 11  5 - 15 Final   Performed at [facility], [address] [place], [place] [address]   Alcohol, Ethyl (B) 01/13/2023 163 (H)  <10 mg/dL Final    Comment: (NOTE) Lowest detectable limit  for serum alcohol is 10 mg/dL.  For medical purposes only. Performed at [facility], [address], [place], [place] [address]    Opiates 01/13/2023 NONE DETECTED  NONE DETECTED Final   Cocaine 01/13/2023 POSITIVE (A)  NONE DETECTED Final   Benzodiazepines 01/13/2023 NONE DETECTED  NONE DETECTED Final   Amphetamines 01/13/2023 POSITIVE (A)  NONE DETECTED Final   Tetrahydrocannabinol 01/13/2023 POSITIVE (A)  NONE DETECTED Final   Barbiturates 01/13/2023 NONE DETECTED  NONE DETECTED Final   Comment: (NOTE) DRUG SCREEN FOR MEDICAL PURPOSES ONLY.  IF CONFIRMATION IS NEEDED FOR ANY PURPOSE, NOTIFY LAB WITHIN 5 DAYS.  LOWEST DETECTABLE LIMITS FOR URINE DRUG SCREEN Drug Class                     Cutoff (ng/mL) Amphetamine and metabolites    1000 Barbiturate and metabolites    200 Benzodiazepine                 200 Opiates and metabolites        300 Cocaine and metabolites        300 THC                            50 Performed at [facility], [address] [place], [place] [address]    SARS Coronavirus 2 by RT PCR 01/13/2023 NEGATIVE  NEGATIVE Final   Comment: (NOTE) SARS-CoV-2 target nucleic acids are NOT DETECTED.  The SARS-CoV-2 RNA is generally detectable in upper and lower respiratory specimens during the acute phase of infection. The lowest concentration of SARS-CoV-2 viral copies this assay can detect is 250 copies / mL. A negative result does not preclude SARS-CoV-2 infection and should not be used as the sole basis for treatment or other patient management decisions.  A negative result may occur with improper specimen collection / handling, submission of specimen other than nasopharyngeal swab, presence of viral mutation(s) within the areas targeted by this assay, and inadequate number of viral copies (<250 copies / mL). A negative result must be combined with clinical observations,  patient history, and epidemiological information.  Fact Sheet for Patients:   RoadLapTop.co.za  Fact Sheet for Healthcare Providers: http://kim-miller.com/  This test is not yet approved or                           cleared by the [place] FDA and has been authorized for detection and/or diagnosis of SARS-CoV-2 by FDA under an Emergency Use Authorization (EUA).  This EUA will remain in effect (meaning this test can be used) for the duration of the COVID-19 declaration under Section 564(b)(1) of the Act, 21 U.S.C. section 360bbb-3(b)(1), unless the authorization is terminated or revoked sooner.  Performed at [facility], [address], [place], [place] [address]    TSH 01/13/2023 0.745  0.350 - 4.500 uIU/mL Final   Comment: Performed by a 3rd Generation assay with a functional sensitivity of <=0.01 uIU/mL. Performed at [facility], [address] [place], [place] [address]    Hgb A1c MFr Bld 01/13/2023 5.5  4.8 - 5.6 % Final   Comment: (NOTE) Pre diabetes:          5.7%-6.4%  Diabetes:              >6.4%  Glycemic control for   <7.0% adults with diabetes  Mean Plasma Glucose 01/13/2023 111.15  mg/dL Final   Performed at [facility], [address] [place], [place] [address]   Cholesterol 01/13/2023 138  0 - 200 mg/dL Final   Triglycerides 36/64/4034 84  <150 mg/dL Final   HDL 74/25/9563 63  >40 mg/dL Final   Total CHOL/HDL Ratio 01/13/2023 2.2  RATIO Final   VLDL 01/13/2023 17  0 - 40 mg/dL Final   LDL Cholesterol 01/13/2023 58  0 - 99 mg/dL Final   Comment:        Total Cholesterol/HDL:CHD Risk Coronary Heart Disease Risk Table                     Men   Women  1/2 Average Risk   3.4   3.3  Average Risk       5.0   4.4  2 X Average Risk   9.6   7.1  3 X Average Risk  23.4   11.0        Use the calculated Patient Ratio above and the CHD Risk Table to determine the  patient's CHD Risk.        ATP III CLASSIFICATION (LDL):  <100     mg/dL   Optimal  875-643  mg/dL   Near or Above                    Optimal  130-159  mg/dL   Borderline  329-518  mg/dL   High  >841     mg/dL   Very High Performed at [facility], [address] [place], [place] [address]     Blood Alcohol level:  Lab Results  Component Value Date   ETH 285 (H) 06/03/2023   ETH 279 (H) 05/28/2023    Metabolic Disorder Labs: Lab Results  Component Value Date   HGBA1C 5.5 02/24/2023   MPG 111 02/24/2023   MPG 116.89 01/14/2023   Lab Results  Component Value Date   PROLACTIN 31.4 (H) 11/16/2019   Lab Results  Component Value Date   CHOL 147 02/24/2023   TRIG 96 02/24/2023   HDL 42 02/24/2023   CHOLHDL 3.5 02/24/2023   VLDL 19 02/24/2023   LDLCALC 86 02/24/2023   LDLCALC 57 01/14/2023    Therapeutic Lab Levels: No results found for: "LITHIUM" No results found for: "VALPROATE" No results found for: "CBMZ"  Physical Findings   AIMS    Flowsheet Row [facility]) [facility] Ad[facility] (Discharged) [facility] Ad[facility] (Discharged[facility]  AIMS Total Score 0 0 0      AUDIT    Flowsheet Row Ad[facility]) [facility] Admission (Discharged) [facility] Admission (Discharged) from OP [facility] Ad[facility] (Discharged[facility]  Alcohol Use Disorder Identification Test Final Score (AUDIT) 26 26 13  37      Flowsheet Row ED from 06/03/2023 in [facility] from 06/01/2023 in [facility] Department at [facility] ED  from 05/28/2023 in [facility] at [facility]  C-SSRS RISK CATEGORY High Risk No Risk No Risk        Musculoskeletal  Strength & Muscle  Tone: within normal limits Gait & Station: normal Patient leans: N/A  Psychiatric Specialty Exam  Presentation  General Appearance:  Disheveled  Eye Contact: Fair  Speech: Clear and Coherent; Normal Rate  Speech Volume: Normal  Handedness: Right   Mood and Affect  Mood: Depressed  Affect: Congruent   Thought Process  Thought Processes: Coherent  Descriptions of Associations:Intact  Orientation:Full (Time, Place and Person)  Thought Content:Logical  Diagnosis of Schizophrenia or Schizoaffective disorder in past: Yes    Hallucinations:Hallucinations: Auditory Description of Auditory Hallucinations: whispers and voices Description of Visual Hallucinations: denies any today  Ideas of Reference:None  Suicidal Thoughts:Suicidal Thoughts: Yes, Passive SI Active Intent and/or Plan: Without Intent  Homicidal Thoughts:Homicidal Thoughts: No   Sensorium  Memory: Immediate Fair; Recent Fair; Remote Fair  Judgment: Poor  Insight: Poor   Executive Functions  Concentration: Fair  Attention Span: Fair  Recall: [employer] of Knowledge: Fair  Language: Fair   Psychomotor Activity  Psychomotor Activity: Psychomotor Activity: Normal   Assets  Assets: Manufacturing systems engineer; Desire for Improvement; Physical Health; Resilience   Sleep  Sleep: Sleep: Good   Nutritional Assessment (For OBS and FBC admissions only) Has the patient had a weight loss or gain of 10 pounds or more in the last 3 months?: No Has the patient had a decrease in food intake/or appetite?: No Does the patient have dental problems?: No Does the patient have eating habits or behaviors that may be indicators of an eating disorder including binging or inducing vomiting?: No Has the patient recently lost  weight without trying?: 0 Has the patient been eating poorly because of a decreased appetite?: 0 Malnutrition Screening Tool Score: 0    Physical Exam  Physical Exam Vitals and nursing note reviewed.  Constitutional:      Appearance: Normal appearance.  Eyes:     General:        Right eye: No discharge.        Left eye: No discharge.  Cardiovascular:     Rate and Rhythm: Normal rate.  Pulmonary:     Effort: No respiratory distress.  Musculoskeletal:        General: Normal range of motion.     Cervical back: Normal range of motion.  Neurological:     Mental Status: He is alert and oriented to person, place, and time.  Psychiatric:        Attention and Perception: Attention normal. He perceives auditory and visual hallucinations.        Mood and Affect: Mood is anxious and depressed.        Speech: Speech normal.        Behavior: Behavior is cooperative.        Thought Content: Thought content is paranoid. Thought content includes suicidal ideation. Thought content includes suicidal plan.        Cognition and Memory: Cognition normal.        Judgment: Judgment is impulsive.    Review of Systems  Constitutional: Negative.   HENT: Negative.    Eyes: Negative.   Respiratory: Negative.    Cardiovascular: Negative.   Genitourinary: Negative.   Musculoskeletal: Negative.   Skin: Negative.   Neurological: Negative.   Psychiatric/Behavioral:  Positive for depression, hallucinations, substance abuse and suicidal ideas. The patient is nervous/anxious.    Blood pressure 139/84, pulse 73, temperature 97.8 F (36.6 C), temperature source Oral, resp. rate 18, SpO2 99%. There is no height or weight on file to calculate BMI.  Treatment  Plan Summary: Daily contact with patient to assess and evaluate symptoms and progress in treatment and Medication management  Patient recommended for inpatient psychiatric admission.  [facility] has no bed availability at this time.  Social work  notified and patient will be faxed out.  Continue CIWA protocol with Ativan 1 mg every 6 hour for CIWA score greater than 10 Continue COWS protocol    06/04/2023 1245 pm

## 2023-06-05 DIAGNOSIS — F1994 Other psychoactive substance use, unspecified with psychoactive substance-induced mood disorder: Secondary | ICD-10-CM | POA: Diagnosis not present

## 2023-06-05 DIAGNOSIS — F259 Schizoaffective disorder, unspecified: Secondary | ICD-10-CM

## 2023-06-05 MED ORDER — OLANZAPINE 5 MG PO TBDP: 5.0000 mg | ORAL_TABLET | Freq: Two times a day (BID) | ORAL | 0 refills | Status: DC

## 2023-06-05 NOTE — ED Notes (Signed)
 Patient alert and oriented x 3. Denies SI/HI/ endorse AVH. Denies intent or plan to harm self or others. Routine conducted according to faculty protocol. Encourage patient to notify staff with any needs or concerns. Patient verbalized agreement and understanding. Will continue to monitor for safety.

## 2023-06-05 NOTE — ED Notes (Signed)
Patient A&O x 4, ambulatory. Patient discharged in no acute distress. Patient denied SI/HI, A/VH upon discharge. Patient verbalized understanding of all discharge instructions explained by staff, to include follow up appointments,rx's  Pt belongings returned to patient from locker escorted to lobby via staff for transport to destination. Safety maintained.

## 2023-06-05 NOTE — ED Notes (Signed)
Patient alert and oriented x 4. Denies SI, HI, AVH. Patient took her medications. Patient denies S/S of alcohol withdrawal. Patient now resting quietly in bed with eyes closed, Respirations equal and unlabored, skin warm and dry, NAD. Routine safety checks conducted according to facility protocol. Will continue to monitor for safety.

## 2023-06-05 NOTE — ED Provider Notes (Signed)
FBC/OBS ASAP Discharge Summary  Date and Time: 06/05/2023 10:15 AM  Name: Mark Hammond  MRN:  [id number]   Discharge Diagnoses:  Final diagnoses:  Substance induced mood disorder (HCC)  Schizoaffective disorder, unspecified type (HCC)    Subjective: "I need a bus pass"  Stay Summary:   Mark Hammond is a 32 year old male admitted to [facility] on 9/31/2024 secondary to hallucinations.   Per Chart review, patient presented with a hx of polysubstance abuse and Schizoaffective disorder. Patient was evaluated face to face by a provider and " During the assessment, the patient was found sleeping in the assessment room and  woke up after several verbal prompts. He displayed significant irritability and appears to be responding to internal stimuli. He was noted to be arguing, using profane language, and speaking loudly to people that were not present in the room and this Nurse practitioner could not see. He reported, "The voices in my head are bothering me because they are on their periods," and mentioned experiencing both visual and auditory hallucinations. He described auditory hallucinations "voices are telling me to kill myself" and showed several scars from previous self-harming/suicide attempts. He admits to experiencing suicidal ideation but refused to share if he has suicidal plan or intention to harm himself. He could not provide details about the visual hallucinations he is experiencing. The patient admitted to not taking his psychiatric medication since his last hospitalization at [facility].  He says he used crack cocaine, fentanyl, and heroin about 3-5 days ago. He reports drinking 1 beer per day.   Patient was admitted to observation unit; medications and labs ordered. He was started on Olanzapine and has  been responding well. CIWA protocol was initiated and patient has not experienced any complications. This morning, patient stayed asleep and has just gotten up. Per chart  review again, pt has a hx of Schizophrenia, depression and polysubstance abuse.    Assessment: 32 year old male sitting in bed. He appears disheveled with minimal eye contact. He is alert and oriented x 4. Patient is guarded and states "I need a bus pass".  Patient has poor hygiene. He denies SI/HI/AVH and states "I am fine, I am ready to go, I will be living at my friend's house". His thought process is organized and goal-directed. He does not appear to be preoccupied. He does not appear to be responding to internal stimuli.  He admits that he was having hallucinations "but I am good now, I have a place go stay at".  Patient reports that his appetite is good. Reports that he has been sleeping well. He reports not having a psychiatrist, nor a therapist.  He is interesting in starting services at [facility]. He is restless and impatient, avoiding long conversations.   Patient admits to using drugs and alcohol. He reports that he drinks beer almost every day and unable to specify the drugs that he uses He denies current medical/physical acuity. Denies pain. Denies headache/dizziness. Denies respiratory problems. Denies muscle/joint pain. Denies chest/back pain. Denies abdominal discomfort. Denies nausea/vomiting. Reports that his appetite is good and has been sleeping well.  He admits that he was not taking his medications but now willing to stay on medications and therapy. He does not want to discuss about substance abuse treatment.   Patient is currently homeless. Was staying at [facility] but got into an argument with peers and had to leave. He reports that he has another housing arrangement  and requests a bus pass to  be able to get to his new address. He is willing to return to [facility] services for medication management and therapy.    Total Time spent with patient: 30 minutes  Past Psychiatric History: MDD, Schizophrenia, Polysubstance abuse Past Medical History: Hep C Family History: NA Family  Psychiatric History: NA Social History: Homeless, no support system Tobacco Cessation:  Prescription not provided because: patient refused  Current Medications:  Current Facility-Administered Medications  Medication Dose Route Frequency Provider Last Rate Last Admin   acetaminophen (TYLENOL) tablet 650 mg  650 mg Oral Q6H PRN Ajibola, Ene A, NP   650 mg at 06/03/23 1848   alum & mag hydroxide-simeth (MAALOX/MYLANTA) 200-200-20 MG/5ML suspension 30 mL  30 mL Oral Q4H PRN Ajibola, Ene A, NP       dicyclomine (BENTYL) tablet 20 mg  20 mg Oral Q6H PRN Ajibola, Ene A, NP       hydrOXYzine (ATARAX) tablet 25 mg  25 mg Oral Q6H PRN Ardis Hughs, NP   25 mg at 06/04/23 2134   ibuprofen (ADVIL) tablet 600 mg  600 mg Oral Q6H PRN Oneta Rack, NP       loperamide (IMODIUM) capsule 2-4 mg  2-4 mg Oral PRN Ajibola, Ene A, NP       LORazepam (ATIVAN) tablet 1 mg  1 mg Oral Q6H PRN Ardis Hughs, NP       magnesium hydroxide (MILK OF MAGNESIA) suspension 30 mL  30 mL Oral Daily PRN Ajibola, Ene A, NP       methocarbamol (ROBAXIN) tablet 500 mg  500 mg Oral Q8H PRN Ajibola, Ene A, NP   500 mg at 06/03/23 2124   multivitamin with minerals tablet 1 tablet  1 tablet Oral Daily Ardis Hughs, NP   1 tablet at 06/05/23 0933   OLANZapine zydis (ZYPREXA) disintegrating tablet 5 mg  5 mg Oral BID Ardis Hughs, NP   5 mg at 06/05/23 0933   ondansetron (ZOFRAN-ODT) disintegrating tablet 4 mg  4 mg Oral Q6H PRN Ajibola, Ene A, NP       thiamine (VITAMIN B1) tablet 100 mg  100 mg Oral Daily Vernard Gambles H, NP   100 mg at 06/05/23 0932   traZODone (DESYREL) tablet 50 mg  50 mg Oral QHS PRN Ajibola, Ene A, NP   50 mg at 06/04/23 2134   No current outpatient medications on file.    PTA Medications:  Facility Ordered Medications  Medication   acetaminophen (TYLENOL) tablet 650 mg   alum & mag hydroxide-simeth (MAALOX/MYLANTA) 200-200-20 MG/5ML suspension 30 mL   magnesium hydroxide (MILK  OF MAGNESIA) suspension 30 mL   traZODone (DESYREL) tablet 50 mg   dicyclomine (BENTYL) tablet 20 mg   loperamide (IMODIUM) capsule 2-4 mg   methocarbamol (ROBAXIN) tablet 500 mg   ondansetron (ZOFRAN-ODT) disintegrating tablet 4 mg   OLANZapine zydis (ZYPREXA) disintegrating tablet 5 mg   [COMPLETED] thiamine (VITAMIN B1) injection 100 mg   thiamine (VITAMIN B1) tablet 100 mg   multivitamin with minerals tablet 1 tablet   LORazepam (ATIVAN) tablet 1 mg   hydrOXYzine (ATARAX) tablet 25 mg   ibuprofen (ADVIL) tablet 600 mg       06/05/2023   10:14 AM  Depression screen PHQ 2/9  Decreased Interest 0  Down, Depressed, Hopeless 0  PHQ - 2 Score 0    Flowsheet Row ED from 06[facility]03/2023 in [facility] ED from 06/01/2023 in [facility] Emergency  Department at [facility] ED from 05/28/2023 in [facility] Emergency Department at [facility]  C-SSRS RISK CATEGORY High Risk No Risk No Risk       Musculoskeletal  Strength & Muscle Tone: within normal limits Gait & Station: normal Patient leans: N/A  Psychiatric Specialty Exam  Presentation  General Appearance:  Disheveled  Eye Contact: Fair  Speech: Clear and Coherent  Speech Volume: Normal  Handedness: Right   Mood and Affect  Mood: Euthymic  Affect: Appropriate; Congruent   Thought Process  Thought Processes: Coherent  Descriptions of Associations:Intact  Orientation:Full (Time, Place and Person)  Thought Content:Logical  Diagnosis of Schizophrenia or Schizoaffective disorder in past: Yes  Duration of Psychotic Symptoms: Greater than six months   Hallucinations:Hallucinations: None Description of Auditory Hallucinations: whispers and voices Description of Visual Hallucinations: denies any today  Ideas of Reference:None  Suicidal Thoughts:Suicidal Thoughts: No SI Active Intent and/or Plan: Without Intent  Homicidal Thoughts:Homicidal Thoughts:  No   Sensorium  Memory: Immediate Fair; Recent Fair; Remote Fair  Judgment: Fair  Insight: Fair   Chartered certified accountant: Fair  Attention Span: Fair  Recall: [employer] of Knowledge: Fair  Language: Fair   Psychomotor Activity  Psychomotor Activity: Psychomotor Activity: Normal   Assets  Assets: Communication Skills; Desire for Improvement; Physical Health   Sleep  Sleep: Sleep: Good Number of Hours of Sleep: 9   Nutritional Assessment (For OBS and FBC admissions only) Has the patient had a weight loss or gain of 10 pounds or more in the last 3 months?: No Has the patient had a decrease in food intake/or appetite?: No Does the patient have dental problems?: No Does the patient have eating habits or behaviors that may be indicators of an eating disorder including binging or inducing vomiting?: No Has the patient recently lost weight without trying?: 0 Has the patient been eating poorly because of a decreased appetite?: 0 Malnutrition Screening Tool Score: 0    Physical Exam  Physical Exam Vitals and nursing note reviewed.  HENT:     Head: Normocephalic and atraumatic.     Right Ear: Tympanic membrane normal.     Left Ear: Tympanic membrane normal.     Mouth/Throat:     Mouth: Mucous membranes are moist.  Eyes:     Extraocular Movements: Extraocular movements intact.     Pupils: Pupils are equal, round, and reactive to light.  Cardiovascular:     Rate and Rhythm: Normal rate.     Pulses: Normal pulses.  Musculoskeletal:        General: Normal range of motion.     Cervical back: Normal range of motion and neck supple.  Skin:    General: Skin is warm.  Neurological:     General: No focal deficit present.     Mental Status: He is alert and oriented to person, place, and time.    Review of Systems  Constitutional: Negative.   HENT: Negative.    Eyes: Negative.   Respiratory: Negative.    Gastrointestinal: Negative.    Genitourinary: Negative.   Musculoskeletal: Negative.   Skin: Negative.   Neurological: Negative.   Endo/Heme/Allergies: Negative.   Psychiatric/Behavioral:  Positive for depression and substance abuse.    Blood pressure 121/88, pulse 60, temperature 98.4 F (36.9 C), temperature source Oral, resp. rate 18, SpO2 99%. There is no height or weight on file to calculate BMI.  Demographic Factors:  Low socioeconomic status and Unemployed  Loss Factors:  Financial problems/change in socioeconomic status  Historical Factors: Impulsivity  Risk Reduction Factors:   NA  Continued Clinical Symptoms:  Depression:   Impulsivity  Cognitive Features That Contribute To Risk:  None    Suicide Risk:  Minimal: No identifiable suicidal ideation.  Patients presenting with no risk factors but with morbid ruminations; may be classified as minimal risk based on the severity of the depressive symptoms  Plan Of Care/Follow-up recommendations:  Activity:  As tolerated  Disposition: Discharge with recommendations to follow up at [facility] for medication management and therapy.   Olin Pia, NP 06/05/2023, 10:15 AM

## 2023-06-09 ENCOUNTER — Emergency Department (HOSPITAL_COMMUNITY)
Admission: EM | Admit: 2023-06-09 | Discharge: 2023-06-09 | Disposition: A | Discharge status: Home / Self Care | Payer: 59 | Attending: Emergency Medicine | Admitting: Emergency Medicine

## 2023-06-09 ENCOUNTER — Emergency Department (HOSPITAL_COMMUNITY)
Admission: EM | Admit: 2023-06-09 | Discharge: 2023-06-10 | Disposition: A | Discharge status: Home / Self Care | Payer: 59 | Source: Home / Self Care | Attending: Emergency Medicine | Admitting: Emergency Medicine

## 2023-06-09 ENCOUNTER — Encounter (HOSPITAL_COMMUNITY): Payer: Self-pay | Admitting: Emergency Medicine

## 2023-06-09 ENCOUNTER — Other Ambulatory Visit: Payer: Self-pay

## 2023-06-09 DIAGNOSIS — R112 Nausea with vomiting, unspecified: Secondary | ICD-10-CM | POA: Insufficient documentation

## 2023-06-09 DIAGNOSIS — R45851 Suicidal ideations: Secondary | ICD-10-CM | POA: Insufficient documentation

## 2023-06-09 DIAGNOSIS — R945 Abnormal results of liver function studies: Secondary | ICD-10-CM | POA: Insufficient documentation

## 2023-06-09 DIAGNOSIS — R442 Other hallucinations: Secondary | ICD-10-CM | POA: Diagnosis not present

## 2023-06-09 DIAGNOSIS — Z5900 Homelessness unspecified: Secondary | ICD-10-CM | POA: Diagnosis not present

## 2023-06-09 DIAGNOSIS — F29 Unspecified psychosis not due to a substance or known physiological condition: Secondary | ICD-10-CM | POA: Diagnosis not present

## 2023-06-09 DIAGNOSIS — F1910 Other psychoactive substance abuse, uncomplicated: Secondary | ICD-10-CM | POA: Diagnosis not present

## 2023-06-09 DIAGNOSIS — R1084 Generalized abdominal pain: Secondary | ICD-10-CM | POA: Insufficient documentation

## 2023-06-09 DIAGNOSIS — R7989 Other specified abnormal findings of blood chemistry: Secondary | ICD-10-CM

## 2023-06-09 DIAGNOSIS — F1420 Cocaine dependence, uncomplicated: Secondary | ICD-10-CM | POA: Diagnosis not present

## 2023-06-09 DIAGNOSIS — G4489 Other headache syndrome: Secondary | ICD-10-CM | POA: Diagnosis not present

## 2023-06-09 DIAGNOSIS — F1220 Cannabis dependence, uncomplicated: Secondary | ICD-10-CM | POA: Diagnosis not present

## 2023-06-09 DIAGNOSIS — F209 Schizophrenia, unspecified: Secondary | ICD-10-CM | POA: Insufficient documentation

## 2023-06-09 DIAGNOSIS — F1990 Other psychoactive substance use, unspecified, uncomplicated: Secondary | ICD-10-CM

## 2023-06-09 DIAGNOSIS — R457 State of emotional shock and stress, unspecified: Secondary | ICD-10-CM | POA: Diagnosis not present

## 2023-06-09 LAB — CBC
HCT: 42.1 % (ref 39.0–52.0)
Hemoglobin: 14.7 g/dL (ref 13.0–17.0)
MCH: 34.2 pg — ABNORMAL HIGH (ref 26.0–34.0)
MCHC: 34.9 g/dL (ref 30.0–36.0)
MCV: 97.9 fL (ref 80.0–100.0)
Platelets: 315 10*3/uL (ref 150–400)
RBC: 4.3 MIL/uL (ref 4.22–5.81)
RDW: 12.8 % (ref 11.5–15.5)
WBC: 10.3 10*3/uL (ref 4.0–10.5)
nRBC: 0 % (ref 0.0–0.2)

## 2023-06-09 LAB — RAPID URINE DRUG SCREEN, HOSP PERFORMED
Amphetamines: NOT DETECTED
Barbiturates: NOT DETECTED
Benzodiazepines: NOT DETECTED
Cocaine: POSITIVE — AB
Opiates: NOT DETECTED
Tetrahydrocannabinol: POSITIVE — AB

## 2023-06-09 LAB — URINALYSIS, ROUTINE W REFLEX MICROSCOPIC
Bilirubin Urine: NEGATIVE
Glucose, UA: NEGATIVE mg/dL
Hgb urine dipstick: NEGATIVE
Ketones, ur: NEGATIVE mg/dL
Leukocytes,Ua: NEGATIVE
Nitrite: NEGATIVE
Protein, ur: NEGATIVE mg/dL
Specific Gravity, Urine: 1.016 (ref 1.005–1.030)
pH: 8 (ref 5.0–8.0)

## 2023-06-09 LAB — COMPREHENSIVE METABOLIC PANEL
ALT: 176 U/L — ABNORMAL HIGH (ref 0–44)
AST: 222 U/L — ABNORMAL HIGH (ref 15–41)
Albumin: 3.6 g/dL (ref 3.5–5.0)
Alkaline Phosphatase: 183 U/L — ABNORMAL HIGH (ref 38–126)
Anion gap: 12 (ref 5–15)
BUN: 14 mg/dL (ref 6–20)
CO2: 25 mmol/L (ref 22–32)
Calcium: 8.6 mg/dL — ABNORMAL LOW (ref 8.9–10.3)
Chloride: 100 mmol/L (ref 98–111)
Creatinine, Ser: 0.75 mg/dL (ref 0.61–1.24)
GFR, Estimated: >60 mL/min (ref >60)
Glucose, Bld: 140 mg/dL — ABNORMAL HIGH (ref 70–99)
Potassium: 3.6 mmol/L (ref 3.5–5.1)
Sodium: 137 mmol/L (ref 135–145)
Total Bilirubin: 0.7 mg/dL (ref 0.3–1.2)
Total Protein: 8.4 g/dL — ABNORMAL HIGH (ref 6.5–8.1)

## 2023-06-09 LAB — LIPASE, BLOOD: Lipase: 38 U/L (ref 11–51)

## 2023-06-09 MED ORDER — OLANZAPINE 5 MG PO TBDP: 5.0000 mg | ORAL_TABLET | Freq: Two times a day (BID) | ORAL | 0 refills | Status: DC

## 2023-06-09 NOTE — ED Triage Notes (Signed)
Patient brought in by EMS for auditory hallucinations. Patient reports HX of schizophrenia and states he has not taken his medications. He also c/o migraines and pressure to crown of head from head injury that he was seen and treated for. He c/o 4/10 pain to head state he had SI without a plan and he is having auditory hallucinations that are not telling him to do anything dangerous.  138/82 97% RA 111 19

## 2023-06-09 NOTE — Care Management (Addendum)
Transition of Care [facility]) - Emergency Department Mini Assessment   Patient Details  Name: Mark Hammond MRN: [id number] Date of Birth: January 09, 1991  Transition of Care [facility]) CM/SW Contact:    Lavenia Atlas, RN Phone Number: 06/09/2023, 1:23 PM   Clinical Narrative: Received TOC consult for medication assistance. Per chart review patient has Development worker, community on file. Patient has insurance provider that [facility]. This RNCM spoke with [facility] [phone] who reports there is a cost with [facility]. This RNCM spoke with patient at bedside who reports he can not afford his medication. "He just need to [facility] to [facility] them [facility]." This RNCM advised patient his [facility] is [facility]. Patient reports he [facility] [address] [facility] [place]. Notified MD of [facility] [address]  Transportation at discharge: bus pass.  No additional TOC needs at this time.    ED Mini Assessment: What brought you to the Emergency Department? : Patient reports HX of schizophrenia and states he has not taken his medications  Barriers to Discharge: No Barriers Identified  Barrier interventions: medication review  Means of departure: Public Transportation  Interventions which prevented an admission or readmission: Medication Review    Patient Contact and Communications Key Contact 1: [employer]   Spoke with: Erinn Contact Date: 06/09/23,   Contact time: 1323      Patient states their goals for this hospitalization and ongoing recovery are:: Assistance with medications CMS Medicare.gov Compare Post Acute Care list provided to:: Patient Choice offered to / list presented to : Patient  Admission diagnosis:  SI Head Inj Patient Active Problem List   Diagnosis Date Noted   Hepatitis C 03/01/2023   Anxiety state 01/14/2023   Insomnia 01/14/2023   Schizoaffective  disorder (HCC) 01/13/2023   Schizophrenia (HCC) 11/15/2019   Polysubstance (including opioids) dependence, daily use (HCC)    Substance induced mood disorder (HCC) 10/24/2019   Methamphetamine use disorder, 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   Alcohol use disorder, severe, dependence (HCC) 08/24/2019   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

## 2023-06-09 NOTE — ED Provider Notes (Signed)
  [place] EMERGENCY DEPARTMENT AT [facility] Provider Note   CSN: [id number] Arrival date & time: 06/09/23  1031     History {Add pertinent medical, surgical, social history, OB history to HPI:1} Chief Complaint  Patient presents with   Hallucinations   Head Injury    Mark Hammond is a 32 y.o. male.  HPI    32 year old male comes in with chief complaint of hallucinations Home Medications Prior to Admission medications   Medication Sig Start Date End Date Taking? Authorizing Provider  OLANZapine zydis (ZYPREXA) 5 MG disintegrating tablet Take 1 tablet (5 mg total) by mouth 2 (two) times daily. 06/09/23   Derwood Kaplan, MD      Allergies    Codeine    Review of Systems   Review of Systems  Physical Exam Updated Vital Signs BP 125/89   Pulse 81   Temp 98.1 F (36.7 C) (Oral)   Resp 14   Ht 5\' 3"  (1.6 m)   Wt 59 kg   SpO2 99%   BMI 23.03 kg/m  Physical Exam  ED Results / Procedures / Treatments   Labs (all labs ordered are listed, but only abnormal results are displayed) Labs Reviewed  RAPID URINE DRUG SCREEN, HOSP PERFORMED - Abnormal; Notable for the following components:      Result Value   Cocaine POSITIVE (*)    Tetrahydrocannabinol POSITIVE (*)    All other components within normal limits  URINALYSIS, ROUTINE W REFLEX MICROSCOPIC    EKG None  Radiology No results found.  Procedures Procedures  {Document cardiac monitor, telemetry assessment procedure when appropriate:1}  Medications Ordered in ED Medications - No data to display  ED Course/ Medical Decision Making/ A&P   {   Click here for ABCD2, HEART and other calculatorsREFRESH Note before signing :1}                              Medical Decision Making Amount and/or Complexity of Data Reviewed Labs: ordered.  Risk Prescription drug management.   ***  {Document critical care time when appropriate:1} {Document review of labs and clinical decision tools ie heart  score, Chads2Vasc2 etc:1}  {Document your independent review of radiology images, and any outside records:1} {Document your discussion with family members, caretakers, and with consultants:1} {Document social determinants of health affecting pt's care:1} {Document your decision making why or why not admission, treatments were needed:1} Final Clinical Impression(s) / ED Diagnoses Final diagnoses:  Schizophrenia, unspecified type (HCC)  Substance use disorder    Rx / DC Orders ED Discharge Orders          Ordered    OLANZapine zydis (ZYPREXA) 5 MG disintegrating tablet  2 times daily,   Status:  Discontinued        06/09/23 1324    OLANZapine zydis (ZYPREXA) 5 MG disintegrating tablet  2 times daily        06/09/23 1327

## 2023-06-09 NOTE — Discharge Instructions (Addendum)
[facility] [address]  This pharmacy should be able to afford you the medication at a lower price and [facility] given your insurance.  Please follow-up with behavioral health urgent care if your symptoms are worsening.  Substance Abuse Treatment Programs  Intensive Outpatient Programs [facility]     [address]      [place], [place]                   [phone]       [facility] [address] [place], [place] [phone]  [facility]     (Inpatient and outpatient)     [address]           [phone]    [facility] [phone] (Suboxone and Methadone)  [address]      [place], [place] [address]      [phone]       [address] [place], [place] [phone]  [facility] (Outpatient/Inpatient, [facility])    (insurance only) [phone]             [facility] (Groups & Residential) [place], [place] [phone]     [facility]     [address]     [place], [place]      [phone]       [facility] (for caregivers and family) [phone] [address] [place], [place] [phone]      Residential Treatment Programs [facility]      [address], [place], [place] [address]  [phone]       [facility] [address] [place], [place] [address] [phone]  [facility] [place]        [phone]       [facility] [phone]  [facility] ([facility]             [address]                                         [place], [place]                                                [phone] or [phone]                               [facility] [address] [place], [address] [phone]  [facility]    [address]      [place], [place]     [phone]       [facility] [address] [place], [place] [phone]  [facility]   [address] [place], [place] [address]     [phone]      Admissions: 8am-3pm M-F  [facility] Services (RTS) [address] [place], [place] [phone]  [facility]: Residential Program [phone] Days)   [place], [place]      [phone] or [phone]     ADATC: [facility] [place], [place] (Walk in Hours over the weekend or by referral)  [facility] [address] [place], [place], [place] [address] [phone]  Crisis Mobile: [facility]:  [phone] (for crisis response 24 hours a day) [facility] Hotline:      [phone] Outpatient Psychiatry and Counseling  [facility]: Mobile Crisis Management 24 hours:  [phone]  [facility] of the [employer] sliding scale fee and walk in schedule: M-F 8am-12pm/1pm-3pm [address]  [place], [place] [address] [phone]  [facility] [address] [place], [place] [address] [phone]  [facility] ([facility])- new patient walk-in appointments available Monday - Friday 8am -3pm.          [address] [place], [place] [address] [phone] or crisis line- [phone]  [facility] Outpatient Services/ Intensive Outpatient Therapy Program [address] [place], [place] [address] [phone]  [facility] Health                  Crisis Services      [phone] [address]     [place], [place] [address]                 [facility]   [facility] [phone]. [address] [place], [place] [address]   [employer] of Care          [address]  [place], [place] [address]       [phone]  [facility] [address] [place], [place] [address] [phone]  [facility]    [address]    [place], [place] [address]     [phone]       Andee Poles, MD     [address]     [place] [address]     [phone]       [facility] [address] [place] [address]  [facility]     [address] [place], [place]      [phone]       [facility] Eulogio Ditch, MD [address] [place], [place] [address] [phone]  [facility]     [address]     [place], [place] [address]     [phone]       [facility] [address] [place], [place] [address] [phone] Resources for Temporary Residential Assistance/Crisis Centers  [facility] CENT[facility]) M-F 8am-3pm   [address] [place], [place] [address]   [phone] Services include: laundry, barbering, support groups, case management, phone  & computer access, showers, AA/NA mtgs, mental health/substance abuse nurse, job skills class, disability information, VA assistance, spiritual classes, etc.   HOMELESS SHELTERS  [facility]     [employer]   [address] [place]     [phone]              [employer] (women and children)       [address] [place], [place] [address] [phone] [email or web address] for application and process Application Required  [facility] [employer] [facility]   [address]    [place] [address]     [phone]                    [facility]  of Hope [address] [place], [place] [address] [phone] application appt.) Application Required  [facility] (women only)    [address]     [place], [place] [address]     [phone]      Intake starts 6pm daily Need valid ID, SSC, & Police report [employer] [address] [place],  [place] [phone] Application Required  [employer] (men only)     [address]      [place], [place]     [phone]       [facility] of the [place] (Pregnant women only) [address] [place], [place] [phone]  [facility]      [address]      [place], [place] [address]     [phone]             [facility] [address] [place], [place] [phone] 90 day commitment/SA/Application process  [facility](men only)     [address]     [place], [place]     [phone]       Check-in at [facility] [address] [place], [place] [address] [phone] Men/Women/Women and Children must be there by 7 pm  [facility] [place], [place] [phone]

## 2023-06-09 NOTE — ED Notes (Signed)
Labeled specimen container given to pt for U/A collection per MD order. [employer]

## 2023-06-09 NOTE — ED Triage Notes (Signed)
Pt arrives after drinking lots of wine and alcohol. Pt is now c/o nausea and vomiting.

## 2023-06-09 NOTE — ED Notes (Signed)
Pt changed out in blue scrubs due to not having any burgundy scrubs. Pt belongings was placed in cabinet 16-18 which contained boxers , shirt, pants and a dollar with change and black shoes.  Pt is currently laying on bed talking to himself.

## 2023-06-10 LAB — URINALYSIS, ROUTINE W REFLEX MICROSCOPIC
Bilirubin Urine: NEGATIVE
Glucose, UA: NEGATIVE mg/dL
Hgb urine dipstick: NEGATIVE
Ketones, ur: NEGATIVE mg/dL
Leukocytes,Ua: NEGATIVE
Nitrite: NEGATIVE
Protein, ur: NEGATIVE mg/dL
Specific Gravity, Urine: 1.01 (ref 1.005–1.030)
pH: 5 (ref 5.0–8.0)

## 2023-06-10 MED ORDER — OLANZAPINE 5 MG PO TBDP
5.0000 mg | ORAL_TABLET | Freq: Every day | ORAL | Status: DC
  Administered 2023-06-10: 5 mg via ORAL
  Filled 2023-06-10: qty 1

## 2023-06-10 NOTE — ED Provider Notes (Signed)
[place] EMERGENCY DEPARTMENT AT [facility] Provider Note   CSN: [id number] Arrival date & time: 06/09/23  2133     History  Chief Complaint  Patient presents with   Nausea   Emesis    Mark Hammond is a 32 y.o. male.  32 year old male presents with complaint of being out of his medications.  He states these were sent to [facility] but they will not be in until Monday.  He has not had his Zyprexa today.  He states that he suffers with delusions and psychosis.  Patient was asked about his triage complaint of vomiting and alcohol use.  He states that he drank alcohol earlier today and was vomiting but is no longer vomiting.  He is currently eating a sandwich and crackers.       Home Medications Prior to Admission medications   Medication Sig Start Date End Date Taking? Authorizing Provider  OLANZapine zydis (ZYPREXA) 5 MG disintegrating tablet Take 1 tablet (5 mg total) by mouth 2 (two) times daily. 06/09/23  Yes Derwood Kaplan, MD      Allergies    Codeine    Review of Systems   Review of Systems Negative except as per HPI Physical Exam Updated Vital Signs BP 125/87 (BP Location: Left Arm)   Pulse (!) 117   Temp 98.5 F (36.9 C) (Oral)   Resp 15   SpO2 97%  Physical Exam Vitals and nursing note reviewed.  Constitutional:      General: He is not in acute distress.    Appearance: He is well-developed. He is not diaphoretic.  HENT:     Head: Normocephalic and atraumatic.  Cardiovascular:     Rate and Rhythm: Normal rate and regular rhythm.     Heart sounds: Normal heart sounds.  Pulmonary:     Effort: Pulmonary effort is normal.     Breath sounds: Normal breath sounds.  Abdominal:     Palpations: Abdomen is soft.     Tenderness: There is no abdominal tenderness.  Skin:    General: Skin is warm and dry.     Findings: No erythema or rash.  Neurological:     Mental Status: He is alert and oriented to person, place, and time.  Psychiatric:        Behavior:  Behavior normal.     ED Results / Procedures / Treatments   Labs (all labs ordered are listed, but only abnormal results are displayed) Labs Reviewed  COMPREHENSIVE METABOLIC PANEL - Abnormal; Notable for the following components:      Result Value   Glucose, Bld 140 (*)    Calcium 8.6 (*)    Total Protein 8.4 (*)    AST 222 (*)    ALT 176 (*)    Alkaline Phosphatase 183 (*)    All other components within normal limits  CBC - Abnormal; Notable for the following components:   MCH 34.2 (*)    All other components within normal limits  LIPASE, BLOOD  URINALYSIS, ROUTINE W REFLEX MICROSCOPIC    EKG None  Radiology No results found.  Procedures Procedures    Medications Ordered in ED Medications  OLANZapine zydis (ZYPREXA) disintegrating tablet 5 mg (has no administration in time range)    ED Course/ Medical Decision Making/ A&P                                 Medical Decision Making  Amount and/or Complexity of Data Reviewed Labs: ordered.   32 year old male with vomiting and abdominal pain on arrival, this is since resolved he is having a [place] sandwich and crackers, no further vomiting.  His abdomen is soft and nontender.  Labs are reviewed, his AST and ALT are elevated, similar to prior on file.  Urine is normal.  Lipase normal.  CBC unremarkable.  Recommend patient follow-up with PCP, provided with information for [place] and wellness if he does not have a PCP.  He states that he has not had his Zyprexa because the pharmacy will not have it until tomorrow.  Patient is provided with dose in the ER today.  He is also given information for [place] and wellness as well as resources for outpatient substance use for alcohol cessation.        Final Clinical Impression(s) / ED Diagnoses Final diagnoses:  Nausea and vomiting, unspecified vomiting type  Generalized abdominal pain  Elevated LFTs    Rx / DC Orders ED Discharge Orders     None          Alden Hipp 06/10/23 Dorothyann Gibbs, April, MD 06/10/23 [phone]

## 2023-06-10 NOTE — Discharge Instructions (Signed)
Recommend follow up with behavioral health.  Take your medications as prescribed. Your liver numbers are elevated in your lab work today. This is likely because of alcohol use. Consider follow up with resource list for alcohol cessation.

## 2023-06-10 NOTE — ED Notes (Signed)
Pt instructed at this time that he is NPO until evaluated by provider however, pt has a sandwich box with a mostly gone sandwich in it and requesting a cola to drink. Pt states he is no longer nauseated at this time. Pt also instructed that a UA specimen is needed for his treatment, which pt has now provided.

## 2023-06-15 ENCOUNTER — Other Ambulatory Visit: Payer: Self-pay

## 2023-06-15 ENCOUNTER — Emergency Department (HOSPITAL_COMMUNITY): Payer: 59

## 2023-06-15 ENCOUNTER — Encounter (HOSPITAL_COMMUNITY): Payer: Self-pay | Admitting: *Deleted

## 2023-06-15 ENCOUNTER — Emergency Department (HOSPITAL_COMMUNITY)
Admission: EM | Admit: 2023-06-15 | Discharge: 2023-06-16 | Disposition: A | Discharge status: Home / Self Care | Payer: 59 | Attending: Emergency Medicine | Admitting: Emergency Medicine

## 2023-06-15 DIAGNOSIS — W19XXXA Unspecified fall, initial encounter: Secondary | ICD-10-CM

## 2023-06-15 DIAGNOSIS — R112 Nausea with vomiting, unspecified: Secondary | ICD-10-CM | POA: Diagnosis not present

## 2023-06-15 DIAGNOSIS — R1084 Generalized abdominal pain: Secondary | ICD-10-CM | POA: Diagnosis not present

## 2023-06-15 DIAGNOSIS — F10129 Alcohol abuse with intoxication, unspecified: Secondary | ICD-10-CM | POA: Diagnosis not present

## 2023-06-15 DIAGNOSIS — R1111 Vomiting without nausea: Secondary | ICD-10-CM | POA: Diagnosis not present

## 2023-06-15 DIAGNOSIS — Y908 Blood alcohol level of 240 mg/100 ml or more: Secondary | ICD-10-CM | POA: Insufficient documentation

## 2023-06-15 DIAGNOSIS — R4020 Unspecified coma: Secondary | ICD-10-CM | POA: Diagnosis not present

## 2023-06-15 DIAGNOSIS — Z043 Encounter for examination and observation following other accident: Secondary | ICD-10-CM | POA: Diagnosis not present

## 2023-06-15 DIAGNOSIS — R1010 Upper abdominal pain, unspecified: Secondary | ICD-10-CM | POA: Diagnosis not present

## 2023-06-15 DIAGNOSIS — R110 Nausea: Secondary | ICD-10-CM | POA: Diagnosis not present

## 2023-06-15 DIAGNOSIS — F10920 Alcohol use, unspecified with intoxication, uncomplicated: Secondary | ICD-10-CM

## 2023-06-15 DIAGNOSIS — R61 Generalized hyperhidrosis: Secondary | ICD-10-CM | POA: Diagnosis not present

## 2023-06-15 DIAGNOSIS — S0990XA Unspecified injury of head, initial encounter: Secondary | ICD-10-CM | POA: Diagnosis not present

## 2023-06-15 LAB — CBC WITH DIFFERENTIAL/PLATELET
Abs Immature Granulocytes: 0.01 10*3/uL (ref 0.00–0.07)
Basophils Absolute: 0.2 10*3/uL — ABNORMAL HIGH (ref 0.0–0.1)
Basophils Relative: 3 %
Eosinophils Absolute: 0.1 10*3/uL (ref 0.0–0.5)
Eosinophils Relative: 2 %
HCT: 46 % (ref 39.0–52.0)
Hemoglobin: 15.5 g/dL (ref 13.0–17.0)
Immature Granulocytes: 0 %
Lymphocytes Relative: 33 %
Lymphs Abs: 1.9 10*3/uL (ref 0.7–4.0)
MCH: 33.6 pg (ref 26.0–34.0)
MCHC: 33.7 g/dL (ref 30.0–36.0)
MCV: 99.8 fL (ref 80.0–100.0)
Monocytes Absolute: 0.4 10*3/uL (ref 0.1–1.0)
Monocytes Relative: 6 %
Neutro Abs: 3.3 10*3/uL (ref 1.7–7.7)
Neutrophils Relative %: 56 %
Platelets: 244 10*3/uL (ref 150–400)
RBC: 4.61 MIL/uL (ref 4.22–5.81)
RDW: 13.3 % (ref 11.5–15.5)
WBC: 5.8 10*3/uL (ref 4.0–10.5)
nRBC: 0 % (ref 0.0–0.2)

## 2023-06-15 LAB — COMPREHENSIVE METABOLIC PANEL
ALT: 205 U/L — ABNORMAL HIGH (ref 0–44)
AST: 296 U/L — ABNORMAL HIGH (ref 15–41)
Albumin: 3.6 g/dL (ref 3.5–5.0)
Alkaline Phosphatase: 145 U/L — ABNORMAL HIGH (ref 38–126)
Anion gap: 9 (ref 5–15)
BUN: 5 mg/dL — ABNORMAL LOW (ref 6–20)
CO2: 28 mmol/L (ref 22–32)
Calcium: 8.3 mg/dL — ABNORMAL LOW (ref 8.9–10.3)
Chloride: 103 mmol/L (ref 98–111)
Creatinine, Ser: 0.65 mg/dL (ref 0.61–1.24)
GFR, Estimated: >60 mL/min (ref >60)
Glucose, Bld: 108 mg/dL — ABNORMAL HIGH (ref 70–99)
Potassium: 3.8 mmol/L (ref 3.5–5.1)
Sodium: 140 mmol/L (ref 135–145)
Total Bilirubin: 0.4 mg/dL (ref 0.3–1.2)
Total Protein: 8.6 g/dL — ABNORMAL HIGH (ref 6.5–8.1)

## 2023-06-15 LAB — ACETAMINOPHEN LEVEL: Acetaminophen (Tylenol), Serum: <10 ug/mL — ABNORMAL LOW (ref 10–30)

## 2023-06-15 LAB — ETHANOL: Alcohol, Ethyl (B): 288 mg/dL — ABNORMAL HIGH (ref <10)

## 2023-06-15 LAB — SALICYLATE LEVEL: Salicylate Lvl: <7 mg/dL — ABNORMAL LOW (ref 7.0–30.0)

## 2023-06-15 LAB — LIPASE, BLOOD: Lipase: 27 U/L (ref 11–51)

## 2023-06-15 NOTE — ED Provider Notes (Signed)
[place] EMERGENCY DEPARTMENT AT [facility] Provider Note   CSN: [id number] Arrival date & time: 06/15/23  1827    History  Chief Complaint  Patient presents with   Alcohol Intoxication   Abdominal Pain    Mark Hammond is a 32 y.o. male history of schizophrenia, polysubstance use, EtOH use, hepatitis C here for evaluation.  EMS called out for unresponsive adult.  Patient ANO x 4 with EMS.  Admits to some upper abdominal pain, nausea, vomiting and drinking alcohol.  When asked, she states "too much". Denies any intent at self harm. States he thinks he fell as well. Pain to right distal femur, tib/fib. Unsure if he hit his head. Denies SI, HI, AVH. States non compliant with psych meds  HPI     Home Medications Prior to Admission medications   Medication Sig Start Date End Date Taking? Authorizing Provider  OLANZapine zydis (ZYPREXA) 5 MG disintegrating tablet Take 1 tablet (5 mg total) by mouth 2 (two) times daily. 06/09/23   Derwood Kaplan, MD      Allergies    Codeine    Review of Systems   Review of Systems  Unable to perform ROS: Mental status change  Constitutional: Negative.   HENT: Negative.    Respiratory: Negative.    Cardiovascular: Negative.   Gastrointestinal:  Positive for abdominal pain, nausea and vomiting. Negative for abdominal distention, anal bleeding, blood in stool, constipation, diarrhea and rectal pain.  Genitourinary: Negative.   Musculoskeletal:        Right leg pain  Skin: Negative.   Neurological: Negative.   All other systems reviewed and are negative.   Physical Exam Updated Vital Signs BP 115/78 (BP Location: Right Arm)   Pulse (!) 54   Temp 97.6 F (36.4 C) (Oral)   Resp 16   Ht 5\' 3"  (1.6 m)   Wt 59.4 kg   SpO2 99%   BMI 23.18 kg/m  Physical Exam Vitals and nursing note reviewed.  Constitutional:      General: He is not in acute distress.    Appearance: He is well-developed. He is not ill-appearing,  toxic-appearing or diaphoretic.     Comments: Smells of alcohol  HENT:     Head: Normocephalic.  Eyes:     Pupils: Pupils are equal, round, and reactive to light.  Cardiovascular:     Rate and Rhythm: Normal rate and regular rhythm.     Heart sounds: Normal heart sounds.  Pulmonary:     Effort: Pulmonary effort is normal. No respiratory distress.     Breath sounds: Normal breath sounds.     Comments: Clear bil Abdominal:     General: Bowel sounds are normal. There is no distension.     Palpations: Abdomen is soft.     Tenderness: There is no abdominal tenderness.     Comments: Non tender, no rebound or guarding  Musculoskeletal:        General: Normal range of motion.     Cervical back: Normal range of motion and neck supple.     Comments: No midline C/T/L tenderness. Diffuse tenderness to RLE.  Skin:    General: Skin is warm and dry.     Capillary Refill: Capillary refill takes less than 2 seconds.  Neurological:     General: No focal deficit present.     Mental Status: He is alert and oriented to person, place, and time.     Comments: Sleepy, arousal to loud voice Spontaneously moves  all 4 extremities     ED Results / Procedures / Treatments   Labs (all labs ordered are listed, but only abnormal results are displayed) Labs Reviewed  CBC WITH DIFFERENTIAL/PLATELET - Abnormal; Notable for the following components:      Result Value   Basophils Absolute 0.2 (*)    All other components within normal limits  COMPREHENSIVE METABOLIC PANEL - Abnormal; Notable for the following components:   Glucose, Bld 108 (*)    BUN 5 (*)    Calcium 8.3 (*)    Total Protein 8.6 (*)    AST 296 (*)    ALT 205 (*)    Alkaline Phosphatase 145 (*)    All other components within normal limits  ETHANOL - Abnormal; Notable for the following components:   Alcohol, Ethyl (B) 288 (*)    All other components within normal limits  SALICYLATE LEVEL - Abnormal; Notable for the following components:    Salicylate Lvl <7.0 (*)    All other components within normal limits  ACETAMINOPHEN LEVEL - Abnormal; Notable for the following components:   Acetaminophen (Tylenol), Serum <10 (*)    All other components within normal limits  LIPASE, BLOOD  URINALYSIS, W/ REFLEX TO CULTURE (INFECTION SUSPECTED)  RAPID URINE DRUG SCREEN, HOSP PERFORMED    EKG None  Radiology DG Tibia/Fibula Right  Result Date: 06/15/2023 CLINICAL DATA:  Fall ETOH EXAM: RIGHT TIBIA AND FIBULA - 2 VIEW COMPARISON:  None Available. FINDINGS: There is no evidence of fracture or other focal bone lesions. Soft tissues are unremarkable. IMPRESSION: Negative. Electronically Signed   By: Jasmine Pang M.D.   On: 06/15/2023 21:11   DG Femur Min 2 Views Right  Result Date: 06/15/2023 CLINICAL DATA:  Fall ETOH EXAM: RIGHT FEMUR 2 VIEWS COMPARISON:  None Available. FINDINGS: No fracture or malalignment. 8.2 cm metallic nail overlying soft tissues of right hip. Adjacent 7.9 cm triangular density also overlying the soft tissues lateral to the right hip. IMPRESSION: 1. No acute osseous abnormality. 2. Metallic nail and triangular density overlying soft tissues of right hip, possible foreign bodies. Correlate with direct inspection Electronically Signed   By: Jasmine Pang M.D.   On: 06/15/2023 21:11   CT Cervical Spine Wo Contrast  Result Date: 06/15/2023 CLINICAL DATA:  Unresponsive found down ET 0 8 EXAM: CT CERVICAL SPINE WITHOUT CONTRAST TECHNIQUE: Multidetector CT imaging of the cervical spine was performed without intravenous contrast. Multiplanar CT image reconstructions were also generated. RADIATION DOSE REDUCTION: This exam was performed according to the departmental dose-optimization program which includes automated exposure control, adjustment of the mA and/or kV according to patient size and/or use of iterative reconstruction technique. COMPARISON:  CT 03/05/2023 FINDINGS: Alignment: Mild motion degradation. Straightening of  the cervical spine. Skull base and vertebrae: No acute fracture. No primary bone lesion or focal pathologic process. Soft tissues and spinal canal: No prevertebral fluid or swelling. No visible canal hematoma. Disc levels:  Within normal limits Upper chest: Negative. Other: None IMPRESSION: Mild motion degradation. Straightening of the cervical spine. No acute osseous abnormality. Electronically Signed   By: Jasmine Pang M.D.   On: 06/15/2023 21:09   CT HEAD WO CONTRAST ( )  Result Date: 06/15/2023 CLINICAL DATA:  Trauma EXAM: CT HEAD WITHOUT CONTRAST TECHNIQUE: Contiguous axial images were obtained from the base of the skull through the vertex without intravenous contrast. RADIATION DOSE REDUCTION: This exam was performed according to the departmental dose-optimization program which includes automated exposure control, adjustment of the  mA and/or kV according to patient size and/or use of iterative reconstruction technique. COMPARISON:  Head CT 05/28/2023 FINDINGS: Brain: No evidence of acute infarction, hemorrhage, hydrocephalus, extra-axial collection or mass lesion/mass effect. Vascular: No hyperdense vessel or unexpected calcification. Skull: Normal. Negative for fracture or focal lesion. Sinuses/Orbits: No acute finding. Other: None. IMPRESSION: No acute intracranial abnormality. Electronically Signed   By: Darliss Cheney M.D.   On: 06/15/2023 21:07    Procedures Procedures    Medications Ordered in ED Medications - No data to display  ED Course/ Medical Decision Making/ A&P   32 year old complex medical history including schizophrenia, polysubstance use, chronic EtOH use here for evaluation of being found down.  Patient admits to drinking alcohol.  He possibly fell, he is not sure states he had some nausea and vomiting.  Pain to right lower extremity.  Patient intermittently cooperative, states he "wants to sleep."  Will not remove clothing for detailed skin exam.  Plan on labs, imaging and  reassess  Labs and imaging personally viewed and interpreted:  CT head no significant abnormality Ct cervical no significant abnormality Xray right femur foreign object, correlate with exam>> patient had large piece of irregular shaped glass as well as large nail in the pockets of his pants.  He states he has this to "protect myself."  This was removed.  Handed to security and charge nurse Xray right tibfib no significant abnormality CBC without leukocytosis   Patient reassessed, ambulatory in room.  Requesting food and drink.  Pending labs.  Care transferred to oncoming provider who will follow-up on labs and dispo.                                 Medical Decision Making Amount and/or Complexity of Data Reviewed External Data Reviewed: labs, radiology and notes. Labs: ordered. Decision-making details documented in ED Course. Radiology: ordered and independent interpretation performed. Decision-making details documented in ED Course.  Risk OTC drugs. Prescription drug management. Diagnosis or treatment significantly limited by social determinants of health.         Final Clinical Impression(s) / ED Diagnoses Final diagnoses:  Alcoholic intoxication without complication (HCC)  Fall, initial encounter    Rx / DC Orders ED Discharge Orders     None         Mustapha Colson A, PA-C 06/15/23 2323    Loetta Rough, MD 06/16/23 1527

## 2023-06-15 NOTE — ED Triage Notes (Signed)
[place]. Pt a&O x 4 with EMS, Abd pain, N/V and ETOH. 126/86-80-95% RA -16-CBG 130

## 2023-06-15 NOTE — ED Notes (Signed)
Sharp piece of glass and ~3: nail given to security for safe keeping.

## 2023-06-16 LAB — URINALYSIS, W/ REFLEX TO CULTURE (INFECTION SUSPECTED)
Bacteria, UA: NONE SEEN
Bilirubin Urine: NEGATIVE
Glucose, UA: NEGATIVE mg/dL
Hgb urine dipstick: NEGATIVE
Ketones, ur: NEGATIVE mg/dL
Leukocytes,Ua: NEGATIVE
Nitrite: NEGATIVE
Protein, ur: NEGATIVE mg/dL
Specific Gravity, Urine: 1.015 (ref 1.005–1.030)
pH: 6 (ref 5.0–8.0)

## 2023-06-16 LAB — RAPID URINE DRUG SCREEN, HOSP PERFORMED
Amphetamines: NOT DETECTED
Barbiturates: NOT DETECTED
Benzodiazepines: NOT DETECTED
Cocaine: POSITIVE — AB
Opiates: NOT DETECTED
Tetrahydrocannabinol: POSITIVE — AB

## 2023-06-16 NOTE — ED Provider Notes (Signed)
Patient here with alcohol intoxication.  Labs reviewed and are reassuring.  Patient has been reassessed, and is in no acute distress.  Feel that he is stable for discharge home.   Roxy Horseman, PA-C 06/16/23 0507    Gilda Crease, MD 06/16/23 0700

## 2023-08-04 ENCOUNTER — Ambulatory Visit (HOSPITAL_COMMUNITY)
Admission: EM | Admit: 2023-08-04 | Discharge: 2023-08-07 | Disposition: A | Discharge status: Home / Self Care | Payer: 59 | Attending: Psychiatry | Admitting: Psychiatry

## 2023-08-04 DIAGNOSIS — Z5900 Homelessness unspecified: Secondary | ICD-10-CM | POA: Insufficient documentation

## 2023-08-04 DIAGNOSIS — F259 Schizoaffective disorder, unspecified: Secondary | ICD-10-CM | POA: Insufficient documentation

## 2023-08-04 DIAGNOSIS — F319 Bipolar disorder, unspecified: Secondary | ICD-10-CM | POA: Insufficient documentation

## 2023-08-04 DIAGNOSIS — F1190 Opioid use, unspecified, uncomplicated: Secondary | ICD-10-CM

## 2023-08-04 DIAGNOSIS — R9431 Abnormal electrocardiogram [ECG] [EKG]: Secondary | ICD-10-CM | POA: Insufficient documentation

## 2023-08-04 DIAGNOSIS — F1510 Other stimulant abuse, uncomplicated: Secondary | ICD-10-CM | POA: Diagnosis not present

## 2023-08-04 DIAGNOSIS — F1310 Sedative, hypnotic or anxiolytic abuse, uncomplicated: Secondary | ICD-10-CM | POA: Diagnosis not present

## 2023-08-04 DIAGNOSIS — F1290 Cannabis use, unspecified, uncomplicated: Secondary | ICD-10-CM

## 2023-08-04 DIAGNOSIS — F17210 Nicotine dependence, cigarettes, uncomplicated: Secondary | ICD-10-CM | POA: Diagnosis not present

## 2023-08-04 DIAGNOSIS — F1210 Cannabis abuse, uncomplicated: Secondary | ICD-10-CM | POA: Insufficient documentation

## 2023-08-04 DIAGNOSIS — F1090 Alcohol use, unspecified, uncomplicated: Secondary | ICD-10-CM | POA: Diagnosis not present

## 2023-08-04 DIAGNOSIS — T43506A Underdosing of unspecified antipsychotics and neuroleptics, initial encounter: Secondary | ICD-10-CM | POA: Diagnosis not present

## 2023-08-04 DIAGNOSIS — Z91120 Patient's intentional underdosing of medication regimen due to financial hardship: Secondary | ICD-10-CM | POA: Diagnosis not present

## 2023-08-04 DIAGNOSIS — F1010 Alcohol abuse, uncomplicated: Secondary | ICD-10-CM | POA: Insufficient documentation

## 2023-08-04 DIAGNOSIS — F1120 Opioid dependence, uncomplicated: Secondary | ICD-10-CM

## 2023-08-04 DIAGNOSIS — F1110 Opioid abuse, uncomplicated: Secondary | ICD-10-CM | POA: Diagnosis not present

## 2023-08-04 DIAGNOSIS — F17200 Nicotine dependence, unspecified, uncomplicated: Secondary | ICD-10-CM

## 2023-08-04 DIAGNOSIS — F1590 Other stimulant use, unspecified, uncomplicated: Secondary | ICD-10-CM

## 2023-08-04 LAB — CBC WITH DIFFERENTIAL/PLATELET
Abs Immature Granulocytes: 0.04 10*3/uL (ref 0.00–0.07)
Basophils Absolute: 0 10*3/uL (ref 0.0–0.1)
Basophils Relative: 1 %
Eosinophils Absolute: 0 10*3/uL (ref 0.0–0.5)
Eosinophils Relative: 0 %
HCT: 36.4 % — ABNORMAL LOW (ref 39.0–52.0)
Hemoglobin: 12.9 g/dL — ABNORMAL LOW (ref 13.0–17.0)
Immature Granulocytes: 1 %
Lymphocytes Relative: 16 %
Lymphs Abs: 0.9 10*3/uL (ref 0.7–4.0)
MCH: 32.8 pg (ref 26.0–34.0)
MCHC: 35.4 g/dL (ref 30.0–36.0)
MCV: 92.6 fL (ref 80.0–100.0)
Monocytes Absolute: 0.3 10*3/uL (ref 0.1–1.0)
Monocytes Relative: 6 %
Neutro Abs: 4.4 10*3/uL (ref 1.7–7.7)
Neutrophils Relative %: 76 %
Platelets: 147 10*3/uL — ABNORMAL LOW (ref 150–400)
RBC: 3.93 MIL/uL — ABNORMAL LOW (ref 4.22–5.81)
RDW: 12.7 % (ref 11.5–15.5)
WBC: 5.8 10*3/uL (ref 4.0–10.5)
nRBC: 0 % (ref 0.0–0.2)

## 2023-08-04 LAB — HEMOGLOBIN A1C
Hgb A1c MFr Bld: 5.4 % (ref 4.8–5.6)
Mean Plasma Glucose: 108.28 mg/dL

## 2023-08-04 LAB — HEPATITIS PANEL, ACUTE
HCV Ab: REACTIVE — AB
Hep A IgM: NONREACTIVE
Hep B C IgM: NONREACTIVE
Hepatitis B Surface Ag: NONREACTIVE

## 2023-08-04 LAB — POCT URINE DRUG SCREEN - MANUAL ENTRY (I-SCREEN)
POC Amphetamine UR: NOT DETECTED
POC Buprenorphine (BUP): NOT DETECTED
POC Cocaine UR: POSITIVE — AB
POC Marijuana UR: POSITIVE — AB
POC Methadone UR: NOT DETECTED
POC Methamphetamine UR: NOT DETECTED
POC Morphine: NOT DETECTED
POC Oxazepam (BZO): NOT DETECTED
POC Oxycodone UR: NOT DETECTED
POC Secobarbital (BAR): NOT DETECTED

## 2023-08-04 LAB — COMPREHENSIVE METABOLIC PANEL
ALT: 99 U/L — ABNORMAL HIGH (ref 0–44)
AST: 205 U/L — ABNORMAL HIGH (ref 15–41)
Albumin: 3.6 g/dL (ref 3.5–5.0)
Alkaline Phosphatase: 98 U/L (ref 38–126)
Anion gap: 15 (ref 5–15)
BUN: 5 mg/dL — ABNORMAL LOW (ref 6–20)
CO2: 24 mmol/L (ref 22–32)
Calcium: 8.2 mg/dL — ABNORMAL LOW (ref 8.9–10.3)
Chloride: 95 mmol/L — ABNORMAL LOW (ref 98–111)
Creatinine, Ser: 0.56 mg/dL — ABNORMAL LOW (ref 0.61–1.24)
GFR, Estimated: >60 mL/min (ref >60)
Glucose, Bld: 115 mg/dL — ABNORMAL HIGH (ref 70–99)
Potassium: 3 mmol/L — ABNORMAL LOW (ref 3.5–5.1)
Sodium: 134 mmol/L — ABNORMAL LOW (ref 135–145)
Total Bilirubin: 1 mg/dL (ref 0.3–1.2)
Total Protein: 7.7 g/dL (ref 6.5–8.1)

## 2023-08-04 LAB — RAPID HIV SCREEN (HIV 1/2 AB+AG)
HIV 1/2 Antibodies: NONREACTIVE
HIV-1 P24 Antigen - HIV24: NONREACTIVE

## 2023-08-04 LAB — TSH: TSH: 0.613 u[IU]/mL (ref 0.350–4.500)

## 2023-08-04 MED ORDER — CLONIDINE HCL 0.1 MG PO TABS
0.1000 mg | ORAL_TABLET | ORAL | Status: DC | PRN
  Administered 2023-08-04: 0.1 mg via ORAL
  Filled 2023-08-04: qty 1

## 2023-08-04 MED ORDER — DICYCLOMINE HCL 20 MG PO TABS: 20.0000 mg | ORAL_TABLET | Freq: Four times a day (QID) | ORAL | Status: DC | PRN

## 2023-08-04 MED ORDER — LORAZEPAM 1 MG PO TABS
1.0000 mg | ORAL_TABLET | Freq: Once | ORAL | Status: AC
  Administered 2023-08-07: 1 mg via ORAL
  Filled 2023-08-04: qty 1

## 2023-08-04 MED ORDER — ACETAMINOPHEN 325 MG PO TABS: 650.0000 mg | ORAL_TABLET | Freq: Four times a day (QID) | ORAL | Status: DC | PRN

## 2023-08-04 MED ORDER — ALUM & MAG HYDROXIDE-SIMETH 200-200-20 MG/5ML PO SUSP: 30.0000 mL | ORAL | Status: DC | PRN

## 2023-08-04 MED ORDER — BACLOFEN 10 MG PO TABS: 5.0000 mg | ORAL_TABLET | Freq: Three times a day (TID) | ORAL | Status: DC | PRN

## 2023-08-04 MED ORDER — ONDANSETRON HCL 4 MG PO TABS
8.0000 mg | ORAL_TABLET | Freq: Three times a day (TID) | ORAL | Status: DC | PRN
  Administered 2023-08-04 (×2): 8 mg via ORAL
  Filled 2023-08-04 (×2): qty 2

## 2023-08-04 MED ORDER — NICOTINE 21 MG/24HR TD PT24
21.0000 mg | MEDICATED_PATCH | Freq: Every day | TRANSDERMAL | Status: DC | PRN
Start: 2023-08-04 — End: 2023-08-07

## 2023-08-04 MED ORDER — NAPROXEN 500 MG PO TABS
500.0000 mg | ORAL_TABLET | Freq: Two times a day (BID) | ORAL | Status: DC | PRN
  Administered 2023-08-04: 500 mg via ORAL
  Filled 2023-08-04: qty 1

## 2023-08-04 MED ORDER — LORAZEPAM 1 MG PO TABS
1.0000 mg | ORAL_TABLET | Freq: Four times a day (QID) | ORAL | Status: AC
  Administered 2023-08-04 (×4): 1 mg via ORAL
  Filled 2023-08-04 (×2): qty 1
  Filled 2023-08-04: qty 2
  Filled 2023-08-04: qty 1

## 2023-08-04 MED ORDER — POTASSIUM CHLORIDE 20 MEQ PO PACK
20.0000 meq | PACK | Freq: Once | ORAL | Status: AC
  Administered 2023-08-04: 20 meq via ORAL
  Filled 2023-08-04: qty 1

## 2023-08-04 MED ORDER — NICOTINE POLACRILEX 2 MG MT GUM: 2.0000 mg | CHEWING_GUM | OROMUCOSAL | Status: DC | PRN

## 2023-08-04 MED ORDER — LORAZEPAM 1 MG PO TABS
1.0000 mg | ORAL_TABLET | Freq: Three times a day (TID) | ORAL | Status: AC
  Administered 2023-08-05 (×3): 1 mg via ORAL
  Filled 2023-08-04 (×3): qty 1

## 2023-08-04 MED ORDER — BISMUTH SUBSALICYLATE 262 MG PO CHEW: 524.0000 mg | CHEWABLE_TABLET | ORAL | Status: DC | PRN

## 2023-08-04 MED ORDER — POTASSIUM CHLORIDE CRYS ER 20 MEQ PO TBCR
60.0000 meq | EXTENDED_RELEASE_TABLET | Freq: Once | ORAL | Status: AC
  Administered 2023-08-04: 60 meq via ORAL
  Filled 2023-08-04: qty 3

## 2023-08-04 MED ORDER — MELATONIN 3 MG PO TABS
3.0000 mg | ORAL_TABLET | Freq: Every evening | ORAL | Status: DC | PRN
  Administered 2023-08-04 – 2023-08-06 (×3): 3 mg via ORAL
  Filled 2023-08-04 (×3): qty 1

## 2023-08-04 MED ORDER — SENNA 8.6 MG PO TABS: 1.0000 | ORAL_TABLET | Freq: Every evening | ORAL | Status: DC | PRN

## 2023-08-04 MED ORDER — HYDROXYZINE HCL 25 MG PO TABS
25.0000 mg | ORAL_TABLET | Freq: Three times a day (TID) | ORAL | Status: DC | PRN
  Administered 2023-08-04 – 2023-08-06 (×2): 25 mg via ORAL
  Filled 2023-08-04 (×2): qty 1

## 2023-08-04 MED ORDER — POTASSIUM CHLORIDE CRYS ER 20 MEQ PO TBCR: 20.0000 meq | EXTENDED_RELEASE_TABLET | Freq: Once | ORAL | Status: AC

## 2023-08-04 MED ORDER — LORAZEPAM 1 MG PO TABS: 1.0000 mg | ORAL_TABLET | Freq: Four times a day (QID) | ORAL | Status: DC | PRN

## 2023-08-04 MED ORDER — LOPERAMIDE HCL 2 MG PO CAPS: 2.0000 mg | ORAL_CAPSULE | ORAL | Status: AC | PRN

## 2023-08-04 MED ORDER — ADULT MULTIVITAMIN W/MINERALS CH
1.0000 | ORAL_TABLET | Freq: Every day | ORAL | Status: DC
  Administered 2023-08-04 – 2023-08-07 (×4): 1 via ORAL
  Filled 2023-08-04 (×4): qty 1

## 2023-08-04 MED ORDER — LORAZEPAM 1 MG PO TABS
1.0000 mg | ORAL_TABLET | Freq: Two times a day (BID) | ORAL | Status: AC
  Administered 2023-08-06 (×2): 1 mg via ORAL
  Filled 2023-08-04 (×2): qty 1

## 2023-08-04 MED ORDER — LORAZEPAM 2 MG/ML IJ SOLN: 1.0000 mg | Freq: Four times a day (QID) | INTRAMUSCULAR | Status: DC | PRN

## 2023-08-04 MED ORDER — POLYETHYLENE GLYCOL 3350 17 G PO PACK: 17.0000 g | PACK | Freq: Every day | ORAL | Status: DC | PRN

## 2023-08-04 MED ORDER — THIAMINE MONONITRATE 100 MG PO TABS
100.0000 mg | ORAL_TABLET | Freq: Every day | ORAL | Status: DC
  Administered 2023-08-04 – 2023-08-07 (×4): 100 mg via ORAL
  Filled 2023-08-04 (×4): qty 1

## 2023-08-04 MED ORDER — POTASSIUM CHLORIDE 20 MEQ PO PACK: 60.0000 meq | PACK | Freq: Once | ORAL | Status: AC

## 2023-08-04 NOTE — ED Notes (Signed)
Pt was provided dinner.

## 2023-08-04 NOTE — Progress Notes (Signed)
Pt is admitted to [facility] due to ETOH and substance abuse. Pt had emesis x2 in the assessment room. Pt is alert and oriented X3. Pt is ambulatory and is oriented to staff/unit. Pt was observed talking to self however denies SI/HI/AH, plan or intent. Pt endorses VH and reported seeing "shadows." Staff will monitor for pt's safety.

## 2023-08-04 NOTE — ED Notes (Addendum)
Manual BP obtaned of 152/92. Augusto Gamble, MD notified.

## 2023-08-04 NOTE — ED Notes (Addendum)
Patient resting in lounger. Calm, collected, no physical complaints at this time. Patient in no apparent acute distress. Environment secured. Safety checks in place per facility protocol.

## 2023-08-04 NOTE — Progress Notes (Signed)
   08/04/23 0820  [facility] Triage Screening (Walk-ins at [facility] only)  How Did You Hear About [place]? Self  What Is the Reason for Your Visit/Call Today? Mark Hammond is a 32 year old male who presents to [facility] voluntarily seeking detox for substance use. Pt reports he uses "everything". He reports fentanyl, cocaine, THC and alcohol use lastnight until about 2 hours ago. He reports snorting fentanyl and cocaine about 2 hours ago. He reports using "a lot" of alcohol lastnight but cannot recall the amount.Pt reports visual hallucinations this week, seeing flashes of light.  Pt reports being diagnosed with Bi-polar disorder, Schizophrenia, and MDD. He states he has been without medication for an unknown amount of time.  Pt reports passive SI this week but denies currently. He denies any plans or intent to harm himself. Pt denies HI and AVH currently. Pt is vomiting during triage, provided with a emesis bag and provider notified.Could not assess further due to vomiting.  How Long Has This Been Causing You Problems? > than 6 months  Have You Recently Had Any Thoughts About Hurting Yourself? Yes  How long ago did you have thoughts about hurting yourself? passive SI  Are You Planning to Commit Suicide/Harm Yourself At This time? No  Have you Recently Had Thoughts About Hurting Someone [name]? No  Are You Planning To Harm Someone At This Time? No  Are you currently experiencing any auditory, visual or other hallucinations? No  Have You Used Any Alcohol or Drugs in the Past 24 Hours? Yes  How long ago did you use Drugs or Alcohol? today  What Did You Use and How Much? fentanyl, cocaine, THC, alcohol, an unknown amount  Do you have any current medical co-morbidities that require immediate attention? No  Clinician description of patient physical appearance/behavior: disheveled, unkempt, vomiting  What Do You Feel Would Help You the Most Today? Alcohol or Drug Use Treatment  If access to [facility] was not available,  would you have sought care in the Emergency Department? No  Determination of Need Urgent (48 hours)  Options For Referral Facility-Based Crisis;ED Visit;BH Urgent Care

## 2023-08-04 NOTE — ED Notes (Signed)
Pt asleep at this hour. No apparent distress. RR even and unlabored. Monitored for safety.  

## 2023-08-04 NOTE — Progress Notes (Signed)
Pt here voluntarily for opioid and alcohol detox. Pt endorses sweats, generalized body aches, headache, nausea, nasal stuffiness, tremors. Pt VSS. CIWA=6 and COWS=8. Pt endorses poor sleep as well. Pt given PRNs as appropriate.

## 2023-08-04 NOTE — ED Notes (Signed)
Blood obtained with no complications. [facility] called for specimen pickup.

## 2023-08-04 NOTE — ED Provider Notes (Cosign Needed Addendum)
[facility] Continuous Assessment Admission H&P  Date: 08/04/23 Patient Name: Mark Hammond MRN: [id number] Chief Complaint: "I need help with my drug use"  Diagnoses:  Final diagnoses:  Schizoaffective disorder, unspecified type (HCC)  Alcohol use disorder  Sedative, hypnotic, or anxiolytic use disorder (HCC)  Stimulant use disorder  Opioid use disorder  Tobacco use disorder  Cannabis use disorder   HPI: Mark Hammond is a 32 y.o. male who is experiencing homelessness with a past psychiatric history of schizoaffective disorder and x5 prior inpatient psychiatric hospitalizations  and substance use history of alcohol use disorder, opioid use disorder, cannabis use disorder, stimulant use disorder, and tobacco use disorder who was admitted voluntarily as a direct admit presenting by self from the community to [facility]) (admitted on 08/04/2023, total  LOS: 0 days ) for assessment and treatment of substance abuse and risk of withdrawal symptoms.  Patient says he needs help with his drug use. He says he has been using "everything - alcohol, crack, heroin, fentanyl, weed, benzos, you name it." He reports becoming immediately homeless and resumed heavy drug use after he was discharged in September from inpatient psychiatric hospitalization.  I asked patient to quantify drug use, but he became irritated and dismissed the questions, saying, "I don't know man, I use a lot of it." He endorses prior IVDU.  He reports last drink was last night "around 11 or 12" and last used fentanyl and heroin this morning "around 2 hours ago."  He denies AVH. He denies SI and HI. He reports not taking any psychiatric medications.  Total Time spent with patient: 1 hour  Musculoskeletal  Strength & Muscle Tone: within normal limits Gait & Station: normal Patient leans: N/A  Psychiatric Specialty Exam  Presentation General Appearance: Disheveled .pse  Eye  Contact:Fair   Speech:Slurred   Speech Volume:Normal   Handedness:-- (not assessed)   Mood and Affect  Mood:-- ("I need help")   Affect:Congruent; Appropriate; Restricted (irritable)   Thought Process  Thought Processes:Goal Directed; Linear; Coherent   Descriptions of Associations:Intact   Orientation:Full (Time, Place and Person)   Thought Content:Logical; WDL  Diagnosis of Schizophrenia or Schizoaffective disorder in past: Yes    Hallucinations:Hallucinations: None   Ideas of Reference:None   Suicidal Thoughts:Suicidal Thoughts: No   Homicidal Thoughts:Homicidal Thoughts: No   Sensorium  Memory:Immediate Good; Recent Good; Remote Fair   Judgment:Poor   Insight:Poor   Executive Functions  Concentration:Good   Attention Span:Good   Recall:Poor   Fund of Knowledge:Fair   Language:Poor   Psychomotor Activity  Psychomotor Activity:Psychomotor Activity: Normal   Assets  Assets:Resilience   Sleep  Sleep:Sleep: -- (not assessed)   No data recorded  Physical Exam Vitals and nursing note reviewed.  HENT:     Head: Normocephalic and atraumatic.  Pulmonary:     Effort: Pulmonary effort is normal.  Musculoskeletal:     Cervical back: Normal range of motion.  Neurological:     General: No focal deficit present.     Mental Status: He is alert.    Review of Systems  Constitutional:        "I feel like crap"  Respiratory: Negative.    Cardiovascular: Negative.   Gastrointestinal: Negative.   Genitourinary: Negative.   Psychiatric/Behavioral:         Psychiatric subjective data addressed in PSE or HPI / daily subjective report   Blood pressure (!) 152/92, pulse (!) 101, temperature 98 F (36.7 C), temperature source Oral, resp.  rate 18, SpO2 98%. There is no height or weight on file to calculate BMI.  Past Psychiatric History: previously diagnosed with schizoaffective disorder   Is the patient at risk to self? Yes Has  the patient been a risk to self in the past 6 months? No Has the patient been a risk to self within the distant past? Yes Is the patient a risk to others? No Has the patient been a risk to others in the past 6 months? No Has the patient been a risk to others within the distant past? No  Past Medical History:      Past Medical History:  Diagnosis Date   Alcoholic gastritis     Bipolar 1 disorder (HCC)     Dental abscess     Depression     Drug abuse (HCC)     ETOH abuse     Hallucination     Schizo affective schizophrenia (HCC)          History reviewed. No pertinent surgical history.     Family History:       Family History  Problem Relation Age of Onset   Stroke Other           Additional Social History: Marital status: Single Are you sexually active?: Yes What is your sexual orientation?: Straight Has your sexual activity been affected by drugs, alcohol, medication, or emotional stress?: Denies Does patient have children?: No  Last Labs:     Latest Ref Rng & Units 06/15/2023   10:28 PM 06/09/2023   10:10 PM 06/03/2023    4:48 AM  CMP  Glucose 70 - 99 mg/dL 308  657  846   BUN 6 - 20 mg/dL 5  14  5    Creatinine 0.61 - 1.24 mg/dL 9.62  9.52  8.41   Sodium 135 - 145 mmol/L 140  137  140   Potassium 3.5 - 5.1 mmol/L 3.8  3.6  3.4   Chloride 98 - 111 mmol/L 103  100  104   CO2 22 - 32 mmol/L 28  25  26    Calcium 8.9 - 10.3 mg/dL 8.3  8.6  8.3   Total Protein 6.5 - 8.1 g/dL 8.6  8.4  7.0   Total Bilirubin 0.3 - 1.2 mg/dL 0.4  0.7  0.8   Alkaline Phos 38 - 126 U/L 145  183  107   AST 15 - 41 U/L 296  222  258   ALT 0 - 44 U/L 205  176  186   CBC    Component Value Date/Time   WBC 5.8 06/15/2023 2228   RBC 4.61 06/15/2023 2228   HGB 15.5 06/15/2023 2228   HCT 46.0 06/15/2023 2228   PLT 244 06/15/2023 2228   MCV 99.8 06/15/2023 2228   MCH 33.6 06/15/2023 2228   MCHC 33.7 06/15/2023 2228   RDW 13.3 06/15/2023 2228   LYMPHSABS 1.9 06/15/2023 2228   MONOABS  0.4 06/15/2023 2228   EOSABS 0.1 06/15/2023 2228   BASOSABS 0.2 (H) 06/15/2023 2228    Allergies: Codeine  PTA Medications: (Not in a hospital admission)   Medical Decision Making               -- CIWA with chlordiazepoxide taper and symptom-triggered lorazepam protocol              -- COWS with as needed clonidine protocol              -- Infectious disease  screening for high-risk exposures -- Patient in need of nicotine replacement; nicotine polacrilex (gum) and nicotine patch 21 mg / 24 hours ordered. Smoking cessation encouraged  PRN              -- start acetaminophen 650 mg every 6 hours as needed for mild to moderate pain, fever, and headaches              -- start hydroxyzine 25 mg three times a day as needed for anxiety              -- start bismuth subsalicylate 524 mg oral chewable tablet every 3 hours as needed for diarrhea / loose stools              -- start senna 8.6 mg oral at bedtime as needed and polyethylene glycol 17 g oral daily as needed for mild to moderate constipation              -- start ondansetron 8 mg every 8 hours as needed for nausea or vomiting              -- start aluminum-magnesium hydroxide + simethicone 30 mL every 4 hours as needed for heartburn or indigestion              -- start melatonin 3 mg bedtime as needed for insomnia -- Opiate withdrawal supportive care: as needed loperamide, naproxen, dicyclomine, and baclofen   -- As needed agitation protocol in-place   Recommendations  Based on my evaluation the patient does not appear to have an emergency medical condition.  Patient will benefit from overnight observation and provided supportive care for substance use withdrawal symptoms.  Augusto Gamble, MD 08/04/2023, 9:53 AM   Augusto Gamble, MD 08/04/23 [phone]

## 2023-08-04 NOTE — ED Notes (Signed)
 Pt was provided lunch

## 2023-08-05 LAB — RPR: RPR Ser Ql: NONREACTIVE

## 2023-08-05 NOTE — ED Notes (Signed)
Patient alert & oriented x4. Denies intent to harm self or others when asked. Denies A/VH. Patient denies any physical complaints when asked. No acute distress noted. Patient was very tired upon assessment but able to answer all questions appropriately. Support and encouragement provided. Routine safety checks conducted per facility protocol. Encouraged patient to notify staff if any thoughts of harm towards self or others arise. Patient verbalizes understanding and agreement.

## 2023-08-05 NOTE — ED Notes (Signed)
Pt asleep at this hour. No apparent distress. RR even and unlabored. Monitored for safety.  

## 2023-08-05 NOTE — ED Notes (Signed)
Patient observed resting quietly, eyes closed. Respirations equal and unlabored. Will continue to monitor for safety.  

## 2023-08-05 NOTE — ED Notes (Signed)
Pt provided breakfast.

## 2023-08-05 NOTE — ED Notes (Signed)
Patient resting in lounger with eyes closed, respirations even and unlabored. Patient in no apparent acute distress. Environment secured. Safety checks in place per facility protocol.

## 2023-08-05 NOTE — ED Notes (Signed)
Pt A&O x 4, no distress noted. Calm & cooperative.  Monitoring for safety. 

## 2023-08-05 NOTE — ED Notes (Signed)
Patient resting in lounger with eyes closed, respirations evan and unlabored. Patient in no apparent acute distress. Environment secured. Safety checks in place per facility protocol.

## 2023-08-05 NOTE — ED Provider Notes (Signed)
Behavioral Health Progress Note  Date and Time: 08/05/2023 9:13 AM Name: Mark Hammond MRN:  [id number]  Subjective:  Mark Hammond, 32 y.o., male patient seen face to face by this provider, consulted with Dr. Dairl Ponder; and chart reviewed on 08/05/23.  On evaluation Mark Hammond reports he wants help stopping his substance use.  He says he "uses everything" and gets irritated when asked to quantify his use. Patient says there was no follow up plan provided when he discharged from his September 2024 stay.  He says he did not continue his medications because he did not have money to buy them and he's homeless.  Patient states he just needs a place to stay and get clean.  He says he often has auditory and visual hallucinations, but he is not experiencing any at this moment. He denies suicidal or homicidal ideation.  During evaluation Mark Hammond is laying on the lounge chair in no acute distress.  He is alert, oriented x 4, calm, cooperative and attentive.  His mood is euthymic with congruent affect.  He has garbled speech, and normal behavior.  Objectively there is no evidence of psychosis/mania or delusional thinking.  Patient is able to converse coherently, goal directed thoughts, no distractibility, or pre-occupation.  He denies suicidal/self-harm/homicidal ideation, psychosis, and paranoia.  Patient answered questions appropriately.    Patient could benefit from detox and substance abuse treatment.  Diagnosis:  Final diagnoses:  Schizoaffective disorder, unspecified type (HCC)  Alcohol use disorder  Sedative, hypnotic, or anxiolytic use disorder (HCC)  Stimulant use disorder  Opioid use disorder  Tobacco use disorder  Cannabis use disorder    Total Time spent with patient: 20 minutes  Past Psychiatric History: Bipolar 1, MDD, Schizoaffective disorder, Polysubstance abuse, prior inpatient stay Past Medical History: Alcoholic gastritis and dental abscess Family History: None noted Family  Psychiatric  History: None noted Social History: Homeless  Additional Social History: Homeless                        Sleep: Good  Appetite:  Good  Current Medications:  Current Facility-Administered Medications  Medication Dose Route Frequency Provider Last Rate Last Admin   acetaminophen (TYLENOL) tablet 650 mg  650 mg Oral Q6H PRN Augusto Gamble, MD       alum & mag hydroxide-simeth (MAALOX/MYLANTA) 200-200-20 MG/5ML suspension 30 mL  30 mL Oral Q4H PRN Augusto Gamble, MD       baclofen (LIORESAL) tablet 5 mg  5 mg Oral Q8H PRN Augusto Gamble, MD       bismuth subsalicylate (PEPTO BISMOL) chewable tablet 524 mg  524 mg Oral Q3H PRN Augusto Gamble, MD       cloNIDine (CATAPRES) tablet 0.1 mg  0.1 mg Oral Q4H PRN Augusto Gamble, MD   0.1 mg at 08/04/23 2218   dicyclomine (BENTYL) tablet 20 mg  20 mg Oral Q6H PRN Augusto Gamble, MD       hydrOXYzine (ATARAX) tablet 25 mg  25 mg Oral TID PRN Augusto Gamble, MD   25 mg at 08/04/23 2218   loperamide (IMODIUM) capsule 2-4 mg  2-4 mg Oral PRN Augusto Gamble, MD       LORazepam (ATIVAN) tablet 1 mg  1 mg Oral Q6H PRN Augusto Gamble, MD       Or   LORazepam (ATIVAN) injection 1 mg  1 mg Intramuscular Q6H PRN Augusto Gamble, MD       LORazepam (ATIVAN) tablet 1 mg  1  mg Oral TID Augusto Gamble, MD       Followed by   Melene Muller ON 08/06/2023] LORazepam (ATIVAN) tablet 1 mg  1 mg Oral BID Augusto Gamble, MD       Followed by   Melene Muller ON 08/07/2023] LORazepam (ATIVAN) tablet 1 mg  1 mg Oral Once Augusto Gamble, MD       melatonin tablet 3 mg  3 mg Oral QHS PRN Augusto Gamble, MD   3 mg at 08/04/23 2218   thiamine (VITAMIN B1) tablet 100 mg  100 mg Oral Daily Augusto Gamble, MD   100 mg at 08/04/23 1610   And   multivitamin with minerals tablet 1 tablet  1 tablet Oral Daily Augusto Gamble, MD   1 tablet at 08/04/23 0949   naproxen (NAPROSYN) tablet 500 mg  500 mg Oral BID PRN Augusto Gamble, MD   500 mg at 08/04/23 2218   nicotine (NICODERM CQ - dosed in mg/24 hours) patch 21 mg  21 mg  Transdermal Daily PRN Augusto Gamble, MD       nicotine polacrilex (NICORETTE) gum 2 mg  2 mg Oral PRN Augusto Gamble, MD       ondansetron [facility]) tablet 8 mg  8 mg Oral Q8H PRN Augusto Gamble, MD   8 mg at 08/04/23 2218   polyethylene glycol (MIRALAX / GLYCOLAX) packet 17 g  17 g Oral Daily PRN Augusto Gamble, MD       senna Mancel Parsons) tablet 8.6 mg  1 tablet Oral QHS PRN Augusto Gamble, MD       Current Outpatient Medications  Medication Sig Dispense Refill   aspirin 325 MG tablet Take 650-1,300 mg by mouth every 6 (six) hours as needed (For pain).     OLANZapine zydis (ZYPREXA) 5 MG disintegrating tablet Take 1 tablet (5 mg total) by mouth 2 (two) times daily. (Patient not taking: Reported on 08/04/2023) 10 tablet 0    Labs  Lab Results:  Admission on 08/04/2023  Component Date Value Ref Range Status   WBC 08/04/2023 5.8  4.0 - 10.5 K/uL Final   RBC 08/04/2023 3.93 (L)  4.22 - 5.81 MIL/uL Final   Hemoglobin 08/04/2023 12.9 (L)  13.0 - 17.0 g/dL Final   HCT 96/01/5408 36.4 (L)  39.0 - 52.0 % Final   MCV 08/04/2023 92.6  80.0 - 100.0 fL Final   MCH 08/04/2023 32.8  26.0 - 34.0 pg Final   MCHC 08/04/2023 35.4  30.0 - 36.0 g/dL Final   RDW 81/19/1478 12.7  11.5 - 15.5 % Final   Platelets 08/04/2023 147 (L)  150 - 400 K/uL Final   Comment: SPECIMEN CHECKED FOR CLOTS REPEATED TO VERIFY PLATELET COUNT CONFIRMED BY SMEAR    nRBC 08/04/2023 0.0  0.0 - 0.2 % Final   Neutrophils Relative % 08/04/2023 76  % Final   Neutro Abs 08/04/2023 4.4  1.7 - 7.7 K/uL Final   Lymphocytes Relative 08/04/2023 16  % Final   Lymphs Abs 08/04/2023 0.9  0.7 - 4.0 K/uL Final   Monocytes Relative 08/04/2023 6  % Final   Monocytes Absolute 08/04/2023 0.3  0.1 - 1.0 K/uL Final   Eosinophils Relative 08/04/2023 0  % Final   Eosinophils Absolute 08/04/2023 0.0  0.0 - 0.5 K/uL Final   Basophils Relative 08/04/2023 1  % Final   Basophils Absolute 08/04/2023 0.0  0.0 - 0.1 K/uL Final   Immature Granulocytes 08/04/2023 1  %  Final   Abs Immature Granulocytes 08/04/2023  0.04  0.00 - 0.07 K/uL Final   Performed at [facility], [address], [place], [place] [address]   Sodium 08/04/2023 134 (L)  135 - 145 mmol/L Final   Potassium 08/04/2023 3.0 (L)  3.5 - 5.1 mmol/L Final   Chloride 08/04/2023 95 (L)  98 - 111 mmol/L Final   CO2 08/04/2023 24  22 - 32 mmol/L Final   Glucose, Bld 08/04/2023 115 (H)  70 - 99 mg/dL Final   Glucose reference range applies only to samples taken after fasting for at least 8 hours.   BUN 08/04/2023 5 (L)  6 - 20 mg/dL Final   Creatinine, Ser 08/04/2023 0.56 (L)  0.61 - 1.24 mg/dL Final   Calcium 16/60/6301 8.2 (L)  8.9 - 10.3 mg/dL Final   Total Protein 60/07/9322 7.7  6.5 - 8.1 g/dL Final   Albumin 55/73/2202 3.6  3.5 - 5.0 g/dL Final   AST 54/27/0623 205 (H)  15 - 41 U/L Final   ALT 08/04/2023 99 (H)  0 - 44 U/L Final   Alkaline Phosphatase 08/04/2023 98  38 - 126 U/L Final   Total Bilirubin 08/04/2023 1.0  0.3 - 1.2 mg/dL Final   GFR, Estimated 08/04/2023 >60  >60 mL/min Final   Comment: (NOTE) Calculated using the CKD-EPI Creatinine Equation (2021)    Anion gap 08/04/2023 15  5 - 15 Final   Performed at [facility], [address] [place], [place] [address]   Hgb A1c MFr Bld 08/04/2023 5.4  4.8 - 5.6 % Final   Comment: (NOTE) Pre diabetes:          5.7%-6.4%  Diabetes:              >6.4%  Glycemic control for   <7.0% adults with diabetes    Mean Plasma Glucose 08/04/2023 108.28  mg/dL Final   Performed at [facility], [address], [place], [place] [address]   TSH 08/04/2023 0.613  0.350 - 4.500 uIU/mL Final   Comment: Performed by a 3rd Generation assay with a functional sensitivity of <=0.01 uIU/mL. Performed at [facility], [address] [place], [place] [address]    HIV-1 P24 Antigen - HIV24 08/04/2023 NON REACTIVE  NON REACTIVE Final   Comment: (NOTE) Detection of p24 may be inhibited by biotin in the sample, causing false  negative results in acute infection.    HIV 1/2 Antibodies 08/04/2023 NON REACTIVE  NON REACTIVE Final   Interpretation (HIV Ag Ab) 08/04/2023 A non reactive test result means that HIV 1 or HIV 2 antibodies and HIV 1 p24 antigen were not detected in the specimen.   Final   Performed at [facility], [address] [place], [place] [address]   Hepatitis B Surface Ag 08/04/2023 NON REACTIVE  NON REACTIVE Final   HCV Ab 08/04/2023 Reactive (A)  NON REACTIVE Final   Comment: (NOTE) The CDC recommends that a Reactive HCV antibody result be followed up  with a HCV Nucleic Acid Amplification test.     Hep A IgM 08/04/2023 NON REACTIVE  NON REACTIVE Final   Hep B C IgM 08/04/2023 NON REACTIVE  NON REACTIVE Final   Performed at [facility], [address], [place], [place] [address]   POC Amphetamine UR 08/04/2023 None Detected  NONE DETECTED (Cut Off Level 1000 ng/mL) Final   POC Secobarbital (BAR) 08/04/2023 None Detected  NONE DETECTED (Cut Off Level 300 ng/mL)  Final   POC Buprenorphine (BUP) 08/04/2023 None Detected  NONE DETECTED (Cut Off Level 10 ng/mL) Final   POC Oxazepam (BZO) 08/04/2023 None Detected  NONE DETECTED (Cut Off Level 300 ng/mL) Final   POC Cocaine UR 08/04/2023 Positive (A)  NONE DETECTED (Cut Off Level 300 ng/mL) Final   POC Methamphetamine UR 08/04/2023 None Detected  NONE DETECTED (Cut Off Level 1000 ng/mL) Final   POC Morphine 08/04/2023 None Detected  NONE DETECTED (Cut Off Level 300 ng/mL) Final   POC Methadone UR 08/04/2023 None Detected  NONE DETECTED (Cut Off Level 300 ng/mL) Final   POC Oxycodone UR 08/04/2023 None Detected  NONE DETECTED (Cut Off Level 100 ng/mL) Final   POC Marijuana UR 08/04/2023 Positive (A)  NONE DETECTED (Cut Off Level 50 ng/mL) Final  Admission on 06/15/2023, Discharged on 06/16/2023  Component Date Value Ref Range Status   WBC 06/15/2023 5.8  4.0 - 10.5 K/uL Final   RBC 06/15/2023 4.61  4.22 - 5.81 MIL/uL Final   Hemoglobin  06/15/2023 15.5  13.0 - 17.0 g/dL Final   HCT 40/98/1191 46.0  39.0 - 52.0 % Final   MCV 06/15/2023 99.8  80.0 - 100.0 fL Final   MCH 06/15/2023 33.6  26.0 - 34.0 pg Final   MCHC 06/15/2023 33.7  30.0 - 36.0 g/dL Final   RDW 47/82/9562 13.3  11.5 - 15.5 % Final   Platelets 06/15/2023 244  150 - 400 K/uL Final   nRBC 06/15/2023 0.0  0.0 - 0.2 % Final   Neutrophils Relative % 06/15/2023 56  % Final   Neutro Abs 06/15/2023 3.3  1.7 - 7.7 K/uL Final   Lymphocytes Relative 06/15/2023 33  % Final   Lymphs Abs 06/15/2023 1.9  0.7 - 4.0 K/uL Final   Monocytes Relative 06/15/2023 6  % Final   Monocytes Absolute 06/15/2023 0.4  0.1 - 1.0 K/uL Final   Eosinophils Relative 06/15/2023 2  % Final   Eosinophils Absolute 06/15/2023 0.1  0.0 - 0.5 K/uL Final   Basophils Relative 06/15/2023 3  % Final   Basophils Absolute 06/15/2023 0.2 (H)  0.0 - 0.1 K/uL Final   Immature Granulocytes 06/15/2023 0  % Final   Abs Immature Granulocytes 06/15/2023 0.01  0.00 - 0.07 K/uL Final   Performed at [facility], [address] [place], [place] [address]   Sodium 06/15/2023 140  135 - 145 mmol/L Final   Potassium 06/15/2023 3.8  3.5 - 5.1 mmol/L Final   Chloride 06/15/2023 103  98 - 111 mmol/L Final   CO2 06/15/2023 28  22 - 32 mmol/L Final   Glucose, Bld 06/15/2023 108 (H)  70 - 99 mg/dL Final   Glucose reference range applies only to samples taken after fasting for at least 8 hours.   BUN 06/15/2023 5 (L)  6 - 20 mg/dL Final   Creatinine, Ser 06/15/2023 0.65  0.61 - 1.24 mg/dL Final   Calcium 57/84/6962 8.3 (L)  8.9 - 10.3 mg/dL Final   Total Protein 95/28/4132 8.6 (H)  6.5 - 8.1 g/dL Final   Albumin 44/10/270 3.6  3.5 - 5.0 g/dL Final   AST 53/66/4403 296 (H)  15 - 41 U/L Final   ALT 06/15/2023 205 (H)  0 - 44 U/L Final   Alkaline Phosphatase 06/15/2023 145 (H)  38 - 126 U/L Final   Total Bilirubin 06/15/2023 0.4  0.3 - 1.2 mg/dL Final   GFR, Estimated 06/15/2023 >60  >60 mL/min Final  Comment: (NOTE) Calculated using the CKD-EPI Creatinine Equation (2021)    Anion gap 06/15/2023 9  5 - 15 Final   Performed at [facility], [address] [place], [place] [address]   Lipase 06/15/2023 27  11 - 51 U/L Final   Performed at [facility], [address], [place], [place] [address]   Specimen Source 06/16/2023 URINE, CLEAN CATCH   Final   Color, Urine 06/16/2023 YELLOW  YELLOW Final   APPearance 06/16/2023 HAZY (A)  CLEAR Final   Specific Gravity, Urine 06/16/2023 1.015  1.005 - 1.030 Final   pH 06/16/2023 6.0  5.0 - 8.0 Final   Glucose, UA 06/16/2023 NEGATIVE  NEGATIVE mg/dL Final   Hgb urine dipstick 06/16/2023 NEGATIVE  NEGATIVE Final   Bilirubin Urine 06/16/2023 NEGATIVE  NEGATIVE Final   Ketones, ur 06/16/2023 NEGATIVE  NEGATIVE mg/dL Final   Protein, ur 09/81/1914 NEGATIVE  NEGATIVE mg/dL Final   Nitrite 78/29/5621 NEGATIVE  NEGATIVE Final   Leukocytes,Ua 06/16/2023 NEGATIVE  NEGATIVE Final   RBC / HPF 06/16/2023 0-5  0 - 5 RBC/hpf Final   WBC, UA 06/16/2023 0-5  0 - 5 WBC/hpf Final   Comment:        Reflex urine culture not performed if WBC <=10, OR if Squamous epithelial cells >5. If Squamous epithelial cells >5 suggest recollection.    Bacteria, UA 06/16/2023 NONE SEEN  NONE SEEN Final   Squamous Epithelial / HPF 06/16/2023 0-5  0 - 5 /HPF Final   Mucus 06/16/2023 PRESENT   Final   Performed at [facility], [address] [place], [place] [address]   Alcohol, Ethyl (B) 06/15/2023 288 (H)  <10 mg/dL Final   Comment: (NOTE) Lowest detectable limit for serum alcohol is 10 mg/dL.  For medical purposes only. Performed at [facility], [address] [place], [place] [address]    Salicylate Lvl 06/15/2023 <7.0 (L)  7.0 - 30.0 mg/dL Final   Performed at [facility], [address], [place], [place] [address]   Acetaminophen (Tylenol), Serum 06/15/2023 <10 (L)   10 - 30 ug/mL Final   Comment: (NOTE) Therapeutic concentrations vary significantly. A range of 10-30 ug/mL  may be an effective concentration for many patients. However, some  are best treated at concentrations outside of this range. Acetaminophen concentrations >150 ug/mL at 4 hours after ingestion  and >50 ug/mL at 12 hours after ingestion are often associated with  toxic reactions.  Performed at [facility], [address], [place], [place] [address]    Opiates 06/16/2023 NONE DETECTED  NONE DETECTED Final   Cocaine 06/16/2023 POSITIVE (A)  NONE DETECTED Final   Benzodiazepines 06/16/2023 NONE DETECTED  NONE DETECTED Final   Amphetamines 06/16/2023 NONE DETECTED  NONE DETECTED Final   Tetrahydrocannabinol 06/16/2023 POSITIVE (A)  NONE DETECTED Final   Barbiturates 06/16/2023 NONE DETECTED  NONE DETECTED Final   Comment: (NOTE) DRUG SCREEN FOR MEDICAL PURPOSES ONLY.  IF CONFIRMATION IS NEEDED FOR ANY PURPOSE, NOTIFY LAB WITHIN 5 DAYS.  LOWEST DETECTABLE LIMITS FOR URINE DRUG SCREEN Drug Class                     Cutoff (ng/mL) Amphetamine and metabolites    1000 Barbiturate and metabolites    200 Benzodiazepine                 200 Opiates and metabolites  300 Cocaine and metabolites        300 THC                            50 Performed at [facility], [address] [place], [place] [address]   Admission on 06/09/2023, Discharged on 06/10/2023  Component Date Value Ref Range Status   Lipase 06/09/2023 38  11 - 51 U/L Final   Performed at [facility], [address], [place], [place] [address]   Sodium 06/09/2023 137  135 - 145 mmol/L Final   Potassium 06/09/2023 3.6  3.5 - 5.1 mmol/L Final   Chloride 06/09/2023 100  98 - 111 mmol/L Final   CO2 06/09/2023 25  22 - 32 mmol/L Final   Glucose, Bld 06/09/2023 140 (H)  70 - 99 mg/dL Final   Glucose reference range applies only to samples taken after fasting  for at least 8 hours.   BUN 06/09/2023 14  6 - 20 mg/dL Final   Creatinine, Ser 06/09/2023 0.75  0.61 - 1.24 mg/dL Final   Calcium 09/81/1914 8.6 (L)  8.9 - 10.3 mg/dL Final   Total Protein 78/29/5621 8.4 (H)  6.5 - 8.1 g/dL Final   Albumin 30/86/5784 3.6  3.5 - 5.0 g/dL Final   AST 69/62/9528 222 (H)  15 - 41 U/L Final   ALT 06/09/2023 176 (H)  0 - 44 U/L Final   Alkaline Phosphatase 06/09/2023 183 (H)  38 - 126 U/L Final   Total Bilirubin 06/09/2023 0.7  0.3 - 1.2 mg/dL Final   GFR, Estimated 06/09/2023 >60  >60 mL/min Final   Comment: (NOTE) Calculated using the CKD-EPI Creatinine Equation (2021)    Anion gap 06/09/2023 12  5 - 15 Final   Performed at [facility], [address] [place], [place] [address]   WBC 06/09/2023 10.3  4.0 - 10.5 K/uL Final   RBC 06/09/2023 4.30  4.22 - 5.81 MIL/uL Final   Hemoglobin 06/09/2023 14.7  13.0 - 17.0 g/dL Final   HCT 40/07/2724 42.1  39.0 - 52.0 % Final   MCV 06/09/2023 97.9  80.0 - 100.0 fL Final   MCH 06/09/2023 34.2 (H)  26.0 - 34.0 pg Final   MCHC 06/09/2023 34.9  30.0 - 36.0 g/dL Final   RDW 36/64/4034 12.8  11.5 - 15.5 % Final   Platelets 06/09/2023 315  150 - 400 K/uL Final   nRBC 06/09/2023 0.0  0.0 - 0.2 % Final   Performed at [facility], [address] [place], [place] [address]   Color, Urine 06/10/2023 YELLOW  YELLOW Final   APPearance 06/10/2023 CLEAR  CLEAR Final   Specific Gravity, Urine 06/10/2023 1.010  1.005 - 1.030 Final   pH 06/10/2023 5.0  5.0 - 8.0 Final   Glucose, UA 06/10/2023 NEGATIVE  NEGATIVE mg/dL Final   Hgb urine dipstick 06/10/2023 NEGATIVE  NEGATIVE Final   Bilirubin Urine 06/10/2023 NEGATIVE  NEGATIVE Final   Ketones, ur 06/10/2023 NEGATIVE  NEGATIVE mg/dL Final   Protein, ur 56/38/7564 NEGATIVE  NEGATIVE mg/dL Final   Nitrite 33/29/5188 NEGATIVE  NEGATIVE Final   Leukocytes,Ua 06/10/2023 NEGATIVE  NEGATIVE Final   Performed at [facility], [address], [place], [place] [address]  Admission on 06/09/2023, Discharged on 06/09/2023  Component Date Value Ref Range Status   Opiates 06/09/2023 NONE DETECTED  NONE DETECTED Final   Cocaine 06/09/2023  POSITIVE (A)  NONE DETECTED Final   Benzodiazepines 06/09/2023 NONE DETECTED  NONE DETECTED Final   Amphetamines 06/09/2023 NONE DETECTED  NONE DETECTED Final   Tetrahydrocannabinol 06/09/2023 POSITIVE (A)  NONE DETECTED Final   Barbiturates 06/09/2023 NONE DETECTED  NONE DETECTED Final   Comment: (NOTE) DRUG SCREEN FOR MEDICAL PURPOSES ONLY.  IF CONFIRMATION IS NEEDED FOR ANY PURPOSE, NOTIFY LAB WITHIN 5 DAYS.  LOWEST DETECTABLE LIMITS FOR URINE DRUG SCREEN Drug Class                     Cutoff (ng/mL) Amphetamine and metabolites    1000 Barbiturate and metabolites    200 Benzodiazepine                 200 Opiates and metabolites        300 Cocaine and metabolites        300 THC                            50 Performed at [facility], [address] [place], [place] [address]    Color, Urine 06/09/2023 YELLOW  YELLOW Final   APPearance 06/09/2023 CLEAR  CLEAR Final   Specific Gravity, Urine 06/09/2023 1.016  1.005 - 1.030 Final   pH 06/09/2023 8.0  5.0 - 8.0 Final   Glucose, UA 06/09/2023 NEGATIVE  NEGATIVE mg/dL Final   Hgb urine dipstick 06/09/2023 NEGATIVE  NEGATIVE Final   Bilirubin Urine 06/09/2023 NEGATIVE  NEGATIVE Final   Ketones, ur 06/09/2023 NEGATIVE  NEGATIVE mg/dL Final   Protein, ur 19/14/7829 NEGATIVE  NEGATIVE mg/dL Final   Nitrite 56/21/3086 NEGATIVE  NEGATIVE Final   Leukocytes,Ua 06/09/2023 NEGATIVE  NEGATIVE Final   Performed at [facility], [address] [place], [place] [address]  Admission on 06/03/2023, Discharged on 06/05/2023  Component Date Value Ref Range Status   WBC 06/03/2023 6.4  4.0 - 10.5 K/uL Final   RBC 06/03/2023 3.88 (L)  4.22 - 5.81 MIL/uL Final   Hemoglobin 06/03/2023 13.3  13.0 - 17.0 g/dL  Final   HCT 96/29/5284 38.0 (L)  39.0 - 52.0 % Final   MCV 06/03/2023 97.9  80.0 - 100.0 fL Final   MCH 06/03/2023 34.3 (H)  26.0 - 34.0 pg Final   MCHC 06/03/2023 35.0  30.0 - 36.0 g/dL Final   RDW 13/24/4010 13.0  11.5 - 15.5 % Final   Platelets 06/03/2023 233  150 - 400 K/uL Final   nRBC 06/03/2023 0.0  0.0 - 0.2 % Final   Neutrophils Relative % 06/03/2023 49  % Final   Neutro Abs 06/03/2023 3.2  1.7 - 7.7 K/uL Final   Lymphocytes Relative 06/03/2023 40  % Final   Lymphs Abs 06/03/2023 2.6  0.7 - 4.0 K/uL Final   Monocytes Relative 06/03/2023 7  % Final   Monocytes Absolute 06/03/2023 0.4  0.1 - 1.0 K/uL Final   Eosinophils Relative 06/03/2023 2  % Final   Eosinophils Absolute 06/03/2023 0.1  0.0 - 0.5 K/uL Final   Basophils Relative 06/03/2023 2  % Final   Basophils Absolute 06/03/2023 0.1  0.0 - 0.1 K/uL Final   Immature Granulocytes 06/03/2023 0  % Final   Abs Immature Granulocytes 06/03/2023 0.02  0.00 - 0.07 K/uL Final   Performed at [facility], [address], [place], [place] [address]   Sodium 06/03/2023 140  135 - 145 mmol/L Final  Potassium 06/03/2023 3.4 (L)  3.5 - 5.1 mmol/L Final   Chloride 06/03/2023 104  98 - 111 mmol/L Final   CO2 06/03/2023 26  22 - 32 mmol/L Final   Glucose, Bld 06/03/2023 112 (H)  70 - 99 mg/dL Final   Glucose reference range applies only to samples taken after fasting for at least 8 hours.   BUN 06/03/2023 5 (L)  6 - 20 mg/dL Final   Creatinine, Ser 06/03/2023 1.04  0.61 - 1.24 mg/dL Final   Calcium 27/25/3664 8.3 (L)  8.9 - 10.3 mg/dL Final   Total Protein 40/34/7425 7.0  6.5 - 8.1 g/dL Final   Albumin 95/63/8756 3.2 (L)  3.5 - 5.0 g/dL Final   AST 43/32/9518 258 (H)  15 - 41 U/L Final   ALT 06/03/2023 186 (H)  0 - 44 U/L Final   Alkaline Phosphatase 06/03/2023 107  38 - 126 U/L Final   Total Bilirubin 06/03/2023 0.8  0.3 - 1.2 mg/dL Final   GFR, Estimated 06/03/2023 >60  >60 mL/min Final   Comment: (NOTE) Calculated using the  CKD-EPI Creatinine Equation (2021)    Anion gap 06/03/2023 10  5 - 15 Final   Performed at [facility], [address], [place], [place] [address]   Alcohol, Ethyl (B) 06/03/2023 285 (H)  <10 mg/dL Final   Comment: (NOTE) Lowest detectable limit for serum alcohol is 10 mg/dL.  For medical purposes only. Performed at [facility], [address], [place], [place] [address]    POC Amphetamine UR 06/03/2023 None Detected  NONE DETECTED (Cut Off Level 1000 ng/mL) Final   POC Secobarbital (BAR) 06/03/2023 None Detected  NONE DETECTED (Cut Off Level 300 ng/mL) Final   POC Buprenorphine (BUP) 06/03/2023 None Detected  NONE DETECTED (Cut Off Level 10 ng/mL) Final   POC Oxazepam (BZO) 06/03/2023 None Detected  NONE DETECTED (Cut Off Level 300 ng/mL) Final   POC Cocaine UR 06/03/2023 Positive (A)  NONE DETECTED (Cut Off Level 300 ng/mL) Final   POC Methamphetamine UR 06/03/2023 None Detected  NONE DETECTED (Cut Off Level 1000 ng/mL) Final   POC Morphine 06/03/2023 Positive (A)  NONE DETECTED (Cut Off Level 300 ng/mL) Final   POC Methadone UR 06/03/2023 None Detected  NONE DETECTED (Cut Off Level 300 ng/mL) Final   POC Oxycodone UR 06/03/2023 None Detected  NONE DETECTED (Cut Off Level 100 ng/mL) Final   POC Marijuana UR 06/03/2023 Positive (A)  NONE DETECTED (Cut Off Level 50 ng/mL) Final  Admission on 05/28/2023, Discharged on 05/28/2023  Component Date Value Ref Range Status   WBC 05/28/2023 11.2 (H)  4.0 - 10.5 K/uL Final   RBC 05/28/2023 4.18 (L)  4.22 - 5.81 MIL/uL Final   Hemoglobin 05/28/2023 14.3  13.0 - 17.0 g/dL Final   HCT 60/07/9322 41.5  39.0 - 52.0 % Final   MCV 05/28/2023 99.3  80.0 - 100.0 fL Final   MCH 05/28/2023 34.2 (H)  26.0 - 34.0 pg Final   MCHC 05/28/2023 34.5  30.0 - 36.0 g/dL Final   RDW 55/73/2202 13.2  11.5 - 15.5 % Final   Platelets 05/28/2023 244  150 - 400 K/uL Final   nRBC 05/28/2023 0.0  0.0 - 0.2 % Final   Neutrophils Relative % 05/28/2023 65  %  Final   Neutro Abs 05/28/2023 7.3  1.7 - 7.7 K/uL Final   Lymphocytes Relative 05/28/2023 27  % Final   Lymphs Abs 05/28/2023 3.0  0.7 -  4.0 K/uL Final   Monocytes Relative 05/28/2023 6  % Final   Monocytes Absolute 05/28/2023 0.7  0.1 - 1.0 K/uL Final   Eosinophils Relative 05/28/2023 1  % Final   Eosinophils Absolute 05/28/2023 0.1  0.0 - 0.5 K/uL Final   Basophils Relative 05/28/2023 1  % Final   Basophils Absolute 05/28/2023 0.1  0.0 - 0.1 K/uL Final   Immature Granulocytes 05/28/2023 0  % Final   Abs Immature Granulocytes 05/28/2023 0.04  0.00 - 0.07 K/uL Final   Performed at [facility], [address] [place], [place] [address]   Sodium 05/28/2023 139  135 - 145 mmol/L Final   Potassium 05/28/2023 3.1 (L)  3.5 - 5.1 mmol/L Final   Chloride 05/28/2023 106  98 - 111 mmol/L Final   CO2 05/28/2023 22  22 - 32 mmol/L Final   Glucose, Bld 05/28/2023 100 (H)  70 - 99 mg/dL Final   Glucose reference range applies only to samples taken after fasting for at least 8 hours.   BUN 05/28/2023 6  6 - 20 mg/dL Final   Creatinine, Ser 05/28/2023 0.70  0.61 - 1.24 mg/dL Final   Calcium 19/14/7829 8.0 (L)  8.9 - 10.3 mg/dL Final   GFR, Estimated 05/28/2023 >60  >60 mL/min Final   Comment: (NOTE) Calculated using the CKD-EPI Creatinine Equation (2021)    Anion gap 05/28/2023 11  5 - 15 Final   Performed at [facility], [address] [place], [place] [address]   Alcohol, Ethyl (B) 05/28/2023 279 (H)  <10 mg/dL Final   Comment: (NOTE) Lowest detectable limit for serum alcohol is 10 mg/dL.  For medical purposes only. Performed at [facility], [address] [place], [place] [address]    Opiates 05/28/2023 POSITIVE (A)  NONE DETECTED Final   Cocaine 05/28/2023 POSITIVE (A)  NONE DETECTED Final   Benzodiazepines 05/28/2023 NONE DETECTED  NONE DETECTED Final   Amphetamines 05/28/2023 POSITIVE (A)  NONE DETECTED Final   Tetrahydrocannabinol 05/28/2023 POSITIVE (A)  NONE  DETECTED Final   Barbiturates 05/28/2023 NONE DETECTED  NONE DETECTED Final   Comment: (NOTE) DRUG SCREEN FOR MEDICAL PURPOSES ONLY.  IF CONFIRMATION IS NEEDED FOR ANY PURPOSE, NOTIFY LAB WITHIN 5 DAYS.  LOWEST DETECTABLE LIMITS FOR URINE DRUG SCREEN Drug Class                     Cutoff (ng/mL) Amphetamine and metabolites    1000 Barbiturate and metabolites    200 Benzodiazepine                 200 Opiates and metabolites        300 Cocaine and metabolites        300 THC                            50 Performed at [facility], [address] [place], [place] [address]   Admission on 05/12/2023, Discharged on 05/12/2023  Component Date Value Ref Range Status   Sodium 05/12/2023 133 (L)  135 - 145 mmol/L Final   Potassium 05/12/2023 3.1 (L)  3.5 - 5.1 mmol/L Final   Chloride 05/12/2023 97 (L)  98 - 111 mmol/L Final   CO2 05/12/2023 21 (L)  22 - 32 mmol/L Final   Glucose, Bld 05/12/2023 101 (H)  70 - 99 mg/dL Final   Glucose reference range applies only  to samples taken after fasting for at least 8 hours.   BUN 05/12/2023 7  6 - 20 mg/dL Final   Creatinine, Ser 05/12/2023 0.61  0.61 - 1.24 mg/dL Final   Calcium 45/40/9811 8.7 (L)  8.9 - 10.3 mg/dL Final   Total Protein 91/47/8295 8.1  6.5 - 8.1 g/dL Final   Albumin 62/13/0865 3.6  3.5 - 5.0 g/dL Final   AST 78/46/9629 566 (H)  15 - 41 U/L Final   ALT 05/12/2023 436 (H)  0 - 44 U/L Final   Alkaline Phosphatase 05/12/2023 127 (H)  38 - 126 U/L Final   Total Bilirubin 05/12/2023 1.3 (H)  0.3 - 1.2 mg/dL Final   GFR, Estimated 05/12/2023 >60  >60 mL/min Final   Comment: (NOTE) Calculated using the CKD-EPI Creatinine Equation (2021)    Anion gap 05/12/2023 15  5 - 15 Final   Performed at [facility], [address], [place], [place] [address]   Alcohol, Ethyl (B) 05/12/2023 <10  <10 mg/dL Final   Comment: (NOTE) Lowest detectable limit for serum alcohol is 10 mg/dL.  For medical purposes only. Performed at [facility], [address] [place], [place] [address]    Opiates 05/12/2023 NONE DETECTED  NONE DETECTED Final   Cocaine 05/12/2023 POSITIVE (A)  NONE DETECTED Final   Benzodiazepines 05/12/2023 NONE DETECTED  NONE DETECTED Final   Amphetamines 05/12/2023 POSITIVE (A)  NONE DETECTED Final   Tetrahydrocannabinol 05/12/2023 NONE DETECTED  NONE DETECTED Final   Barbiturates 05/12/2023 NONE DETECTED  NONE DETECTED Final   Comment: (NOTE) DRUG SCREEN FOR MEDICAL PURPOSES ONLY.  IF CONFIRMATION IS NEEDED FOR ANY PURPOSE, NOTIFY LAB WITHIN 5 DAYS.  LOWEST DETECTABLE LIMITS FOR URINE DRUG SCREEN Drug Class                     Cutoff (ng/mL) Amphetamine and metabolites    1000 Barbiturate and metabolites    200 Benzodiazepine                 200 Opiates and metabolites        300 Cocaine and metabolites        300 THC                            50 Performed at [facility], [address], [place], [place] [address]    WBC 05/12/2023 6.8  4.0 - 10.5 K/uL Final   RBC 05/12/2023 4.77  4.22 - 5.81 MIL/uL Final   Hemoglobin 05/12/2023 15.7  13.0 - 17.0 g/dL Final   HCT 53/66/4403 45.3  39.0 - 52.0 % Final   MCV 05/12/2023 95.0  80.0 - 100.0 fL Final   MCH 05/12/2023 32.9  26.0 - 34.0 pg Final   MCHC 05/12/2023 34.7  30.0 - 36.0 g/dL Final   RDW 47/42/5956 13.0  11.5 - 15.5 % Final   Platelets 05/12/2023 194  150 - 400 K/uL Final   nRBC 05/12/2023 0.0  0.0 - 0.2 % Final   Neutrophils Relative % 05/12/2023 73  % Final   Neutro Abs 05/12/2023 5.0  1.7 - 7.7 K/uL Final   Lymphocytes Relative 05/12/2023 17  % Final   Lymphs Abs 05/12/2023 1.1  0.7 - 4.0 K/uL Final   Monocytes Relative 05/12/2023 8  % Final   Monocytes Absolute 05/12/2023 0.5  0.1 - 1.0 K/uL Final   Eosinophils Relative  05/12/2023 1  % Final   Eosinophils Absolute 05/12/2023 0.1  0.0 - 0.5 K/uL Final   Basophils Relative 05/12/2023 1  % Final   Basophils Absolute 05/12/2023 0.1  0.0 - 0.1 K/uL Final   Immature  Granulocytes 05/12/2023 0  % Final   Abs Immature Granulocytes 05/12/2023 0.03  0.00 - 0.07 K/uL Final   Performed at [facility], [address], [place], [place] [address]   SARS Coronavirus 2 by RT PCR 05/12/2023 NEGATIVE  NEGATIVE Final   Performed at [facility], [address] [place], [place] [address]   Total CK 05/12/2023 438 (H)  49 - 397 U/L Final   Performed at [facility], [address], [place], [place] [address]  Admission on 03/05/2023, Discharged on 03/05/2023  Component Date Value Ref Range Status   Sodium 03/05/2023 137  135 - 145 mmol/L Final   Potassium 03/05/2023 3.2 (L)  3.5 - 5.1 mmol/L Final   Chloride 03/05/2023 102  98 - 111 mmol/L Final   CO2 03/05/2023 24  22 - 32 mmol/L Final   Glucose, Bld 03/05/2023 209 (H)  70 - 99 mg/dL Final   Glucose reference range applies only to samples taken after fasting for at least 8 hours.   BUN 03/05/2023 10  6 - 20 mg/dL Final   Creatinine, Ser 03/05/2023 0.78  0.61 - 1.24 mg/dL Final   Calcium 91/47/8295 8.1 (L)  8.9 - 10.3 mg/dL Final   Total Protein 62/13/0865 7.0  6.5 - 8.1 g/dL Final   Albumin 78/46/9629 3.1 (L)  3.5 - 5.0 g/dL Final   AST 52/84/1324 287 (H)  15 - 41 U/L Final   ALT 03/05/2023 261 (H)  0 - 44 U/L Final   Alkaline Phosphatase 03/05/2023 140 (H)  38 - 126 U/L Final   Total Bilirubin 03/05/2023 0.7  0.3 - 1.2 mg/dL Final   GFR, Estimated 03/05/2023 >60  >60 mL/min Final   Comment: (NOTE) Calculated using the CKD-EPI Creatinine Equation (2021)    Anion gap 03/05/2023 11  5 - 15 Final   Performed at [facility], [address], [place], [place] [address]   Alcohol, Ethyl (B) 03/05/2023 171 (H)  <10 mg/dL Final   Comment: (NOTE) Lowest detectable limit for serum alcohol is 10 mg/dL.  For medical purposes only. Performed at [facility], [address], [place], [place] [address]    WBC 03/05/2023 11.4 (H)  4.0 - 10.5 K/uL Final   RBC 03/05/2023 4.20 (L)  4.22 -  5.81 MIL/uL Final   Hemoglobin 03/05/2023 13.9  13.0 - 17.0 g/dL Final   HCT 64/40/3474 40.4  39.0 - 52.0 % Final   MCV 03/05/2023 96.2  80.0 - 100.0 fL Final   MCH 03/05/2023 33.1  26.0 - 34.0 pg Final   MCHC 03/05/2023 34.4  30.0 - 36.0 g/dL Final   RDW 25/95/6387 13.8  11.5 - 15.5 % Final   Platelets 03/05/2023 392  150 - 400 K/uL Final   nRBC 03/05/2023 0.0  0.0 - 0.2 % Final   Performed at [facility], [address] [place], [place] [address]   Opiates 03/05/2023 NONE DETECTED  NONE DETECTED Final   Cocaine 03/05/2023 POSITIVE (A)  NONE DETECTED Final   Benzodiazepines 03/05/2023 POSITIVE (A)  NONE DETECTED Final   Amphetamines 03/05/2023 NONE DETECTED  NONE DETECTED Final   Tetrahydrocannabinol 03/05/2023 POSITIVE (A)  NONE DETECTED Final  Barbiturates 03/05/2023 NONE DETECTED  NONE DETECTED Final   Comment: (NOTE) DRUG SCREEN FOR MEDICAL PURPOSES ONLY.  IF CONFIRMATION IS NEEDED FOR ANY PURPOSE, NOTIFY LAB WITHIN 5 DAYS.  LOWEST DETECTABLE LIMITS FOR URINE DRUG SCREEN Drug Class                     Cutoff (ng/mL) Amphetamine and metabolites    1000 Barbiturate and metabolites    200 Benzodiazepine                 200 Opiates and metabolites        300 Cocaine and metabolites        300 THC                            50 Performed at [facility], [address] [place], [place] [address]   Admission on 02/23/2023, Discharged on 03/01/2023  Component Date Value Ref Range Status   Cholesterol 02/24/2023 147  0 - 200 mg/dL Final   Triglycerides 82/95/6213 96  <150 mg/dL Final   HDL 08/65/7846 42  >40 mg/dL Final   Total CHOL/HDL Ratio 02/24/2023 3.5  RATIO Final   VLDL 02/24/2023 19  0 - 40 mg/dL Final   LDL Cholesterol 02/24/2023 86  0 - 99 mg/dL Final   Comment:        Total Cholesterol/HDL:CHD Risk Coronary Heart Disease Risk Table                     Men   Women  1/2 Average Risk   3.4   3.3  Average Risk       5.0   4.4  2 X Average Risk   9.6    7.1  3 X Average Risk  23.4   11.0        Use the calculated Patient Ratio above and the CHD Risk Table to determine the patient's CHD Risk.        ATP III CLASSIFICATION (LDL):  <100     mg/dL   Optimal  962-952  mg/dL   Near or Above                    Optimal  130-159  mg/dL   Borderline  841-324  mg/dL   High  >401     mg/dL   Very High Performed at [facility], [address] [place], [place] [address]    Hgb A1c MFr Bld 02/24/2023 5.5  4.8 - 5.6 % Final   Comment: (NOTE)         Prediabetes: 5.7 - 6.4         Diabetes: >6.4         Glycemic control for adults with diabetes: <7.0    Mean Plasma Glucose 02/24/2023 111  mg/dL Final   Comment: (NOTE) Performed At: [facility] [address] [place], [place] [id number] Jolene Schimke MD QQ:[id number]    Hepatitis B Surface Ag 02/25/2023 NON REACTIVE  NON REACTIVE Final   HCV Ab 02/25/2023 Reactive (A)  NON REACTIVE Final   Comment: (NOTE) The CDC recommends that a Reactive HCV antibody result be followed up  with a HCV Nucleic Acid Amplification test.     Hep A IgM 02/25/2023 NON REACTIVE  NON REACTIVE Final   Hep B C IgM 02/25/2023 NON REACTIVE  NON REACTIVE Final   Performed  at [facility], [address] [place], [place] [address]   Potassium 02/25/2023 4.4  3.5 - 5.1 mmol/L Final   Performed at [facility], [address] [place], [place] [address]   HCV RNA, Quantitation 02/27/2023 See Final Results  IU/mL Final   Test Information: 02/27/2023 Comment   Final   Comment: (NOTE) The quantitative range of this assay is 15 IU/mL to 100 million IU/mL. Performed At: [facility] [address] [place], [place] [id number] Jolene Schimke MD QM:[id number]    Sodium 02/27/2023 138  135 - 145 mmol/L Final   Potassium 02/27/2023 4.1  3.5 - 5.1 mmol/L Final   Chloride 02/27/2023 102  98 - 111 mmol/L Final   CO2 02/27/2023 28  22 - 32 mmol/L Final    Glucose, Bld 02/27/2023 96  70 - 99 mg/dL Final   Glucose reference range applies only to samples taken after fasting for at least 8 hours.   BUN 02/27/2023 9  6 - 20 mg/dL Final   Creatinine, Ser 02/27/2023 0.75  0.61 - 1.24 mg/dL Final   Calcium 28/41/3244 8.4 (L)  8.9 - 10.3 mg/dL Final   Total Protein 10/05/7251 7.1  6.5 - 8.1 g/dL Final   Albumin 66/44/0347 2.9 (L)  3.5 - 5.0 g/dL Final   AST 42/59/5638 168 (H)  15 - 41 U/L Final   ALT 02/27/2023 193 (H)  0 - 44 U/L Final   Alkaline Phosphatase 02/27/2023 97  38 - 126 U/L Final   Total Bilirubin 02/27/2023 0.5  0.3 - 1.2 mg/dL Final   GFR, Estimated 02/27/2023 >60  >60 mL/min Final   Comment: (NOTE) Calculated using the CKD-EPI Creatinine Equation (2021)    Anion gap 02/27/2023 8  5 - 15 Final   Performed at [facility], [address] [place], [place] [address]   HCV RNA - Quantitation 02/27/2023 6,550,000  IU/mL Final   Comment: (NOTE)                HCV RNA detected HCV RNA viral loads >/= 25 IU/mL indicate current HCV infection.    HCV RNA - log10 02/27/2023 6.816  log10 IU/mL Final   Comment: (NOTE) Performed At: [facility] [address] [place] [address] [place], [place] [id number] Jolene Schimke MD YS:[id number]   Admission on 02/23/2023, Discharged on 02/23/2023  Component Date Value Ref Range Status   Acetaminophen (Tylenol), Serum 02/23/2023 <10 (L)  10 - 30 ug/mL Final   Comment: (NOTE) Therapeutic concentrations vary significantly. A range of 10-30 ug/mL  may be an effective concentration for many patients. However, some  are best treated at concentrations outside of this range. Acetaminophen concentrations >150 ug/mL at 4 hours after ingestion  and >50 ug/mL at 12 hours after ingestion are often associated with  toxic reactions.  Performed at [facility], [address] [place], [place] [address]    Sodium 02/23/2023 138  135 - 145 mmol/L Final   Potassium 02/23/2023 3.1  (L)  3.5 - 5.1 mmol/L Final   Chloride 02/23/2023 103  98 - 111 mmol/L Final   CO2 02/23/2023 24  22 - 32 mmol/L Final   Glucose, Bld 02/23/2023 115 (H)  70 - 99 mg/dL Final   Glucose reference range applies only to samples taken after fasting for at least 8 hours.   BUN 02/23/2023 9  6 - 20 mg/dL Final   Creatinine, Ser 02/23/2023 0.75  0.61 - 1.24 mg/dL Final  Calcium 02/23/2023 8.2 (L)  8.9 - 10.3 mg/dL Final   Total Protein 69/62/9528 7.6  6.5 - 8.1 g/dL Final   Albumin 41/32/4401 3.3 (L)  3.5 - 5.0 g/dL Final   AST 02/72/5366 168 (H)  15 - 41 U/L Final   ALT 02/23/2023 138 (H)  0 - 44 U/L Final   Alkaline Phosphatase 02/23/2023 127 (H)  38 - 126 U/L Final   Total Bilirubin 02/23/2023 0.6  0.3 - 1.2 mg/dL Final   GFR, Estimated 02/23/2023 >60  >60 mL/min Final   Comment: (NOTE) Calculated using the CKD-EPI Creatinine Equation (2021)    Anion gap 02/23/2023 11  5 - 15 Final   Performed at [facility], [address], [place], [place] [address]   Alcohol, Ethyl (B) 02/23/2023 107 (H)  <10 mg/dL Final   Comment: (NOTE) Lowest detectable limit for serum alcohol is 10 mg/dL.  For medical purposes only. Performed at [facility], [address], [place], [place] [address]    WBC 02/23/2023 9.3  4.0 - 10.5 K/uL Final   RBC 02/23/2023 4.19 (L)  4.22 - 5.81 MIL/uL Final   Hemoglobin 02/23/2023 13.9  13.0 - 17.0 g/dL Final   HCT 56/38/7564 40.5  39.0 - 52.0 % Final   MCV 02/23/2023 96.7  80.0 - 100.0 fL Final   MCH 02/23/2023 33.2  26.0 - 34.0 pg Final   MCHC 02/23/2023 34.3  30.0 - 36.0 g/dL Final   RDW 33/29/5188 14.3  11.5 - 15.5 % Final   Platelets 02/23/2023 227  150 - 400 K/uL Final   nRBC 02/23/2023 0.0  0.0 - 0.2 % Final   Neutrophils Relative % 02/23/2023 61  % Final   Neutro Abs 02/23/2023 5.6  1.7 - 7.7 K/uL Final   Lymphocytes Relative 02/23/2023 31  % Final   Lymphs Abs 02/23/2023 2.9  0.7 - 4.0 K/uL Final   Monocytes Relative  02/23/2023 6  % Final   Monocytes Absolute 02/23/2023 0.5  0.1 - 1.0 K/uL Final   Eosinophils Relative 02/23/2023 1  % Final   Eosinophils Absolute 02/23/2023 0.1  0.0 - 0.5 K/uL Final   Basophils Relative 02/23/2023 1  % Final   Basophils Absolute 02/23/2023 0.1  0.0 - 0.1 K/uL Final   Immature Granulocytes 02/23/2023 0  % Final   Abs Immature Granulocytes 02/23/2023 0.03  0.00 - 0.07 K/uL Final   Performed at [facility], [address], [place], [place] [address]   Salicylate Lvl 02/23/2023 <7.0 (L)  7.0 - 30.0 mg/dL Final   Performed at [facility], [address], [place], [place] [address]   Opiates 02/23/2023 NONE DETECTED  NONE DETECTED Final   Cocaine 02/23/2023 POSITIVE (A)  NONE DETECTED Final   Benzodiazepines 02/23/2023 NONE DETECTED  NONE DETECTED Final   Amphetamines 02/23/2023 NONE DETECTED  NONE DETECTED Final   Tetrahydrocannabinol 02/23/2023 POSITIVE (A)  NONE DETECTED Final   Barbiturates 02/23/2023 NONE DETECTED  NONE DETECTED Final   Comment: (NOTE) DRUG SCREEN FOR MEDICAL PURPOSES ONLY.  IF CONFIRMATION IS NEEDED FOR ANY PURPOSE, NOTIFY LAB WITHIN 5 DAYS.  LOWEST DETECTABLE LIMITS FOR URINE DRUG SCREEN Drug Class                     Cutoff (ng/mL) Amphetamine and metabolites    1000 Barbiturate and metabolites    200 Benzodiazepine  200 Opiates and metabolites        300 Cocaine and metabolites        300 THC                            50 Performed at [facility], [address], [place], [place] [address]   There may be more visits with results that are not included.    Blood Alcohol level:  Lab Results  Component Value Date   ETH 288 (H) 06/15/2023   ETH 285 (H) 06/03/2023    Metabolic Disorder Labs: Lab Results  Component Value Date   HGBA1C 5.4 08/04/2023   MPG 108.28 08/04/2023   MPG 111 02/24/2023   Lab Results  Component Value Date   PROLACTIN 31.4 (H)  11/16/2019   Lab Results  Component Value Date   CHOL 147 02/24/2023   TRIG 96 02/24/2023   HDL 42 02/24/2023   CHOLHDL 3.5 02/24/2023   VLDL 19 02/24/2023   LDLCALC 86 02/24/2023   LDLCALC 57 01/14/2023    Therapeutic Lab Levels: No results found for: "LITHIUM" No results found for: "VALPROATE" No results found for: "CBMZ"  Physical Findings   AIMS    Flowsheet Row [facility]) [facility] Ad[facility] (Discharged) [facility] Ad[facility] (Discharged) [facility]  AIMS Total Score 0 0 0      AUDIT    Flowsheet Row Ad[facility]) [facility] Admission (Discharged) [facility] Admission (Discharged) from OP [facility] Ad[facility] (Discharged) [facility]  Alcohol Use Disorder Identification Test Final Score (AUDIT) 26 26 13  37      PHQ2-9    Flowsheet Row ED from 06/03/2023 in [facility]  PHQ-2 Total Score 0      Flowsheet Row ED from 08/04/2023 in [facility] ED from 06/15/2023 in [facility] at [facility] ED from 06/09/2023 in [facility] at [facility]  C-SSRS RISK CATEGORY No Risk Error: Question 6 not populated Error: Q3, 4, or 5 should not be populated when Q2 is No        Musculoskeletal  Strength & Muscle Tone: within normal limits Gait & Station: normal Patient leans: N/A  Psychiatric Specialty Exam  Presentation  General Appearance:  Disheveled  Eye Contact: Fair  Speech: Slurred  Speech Volume: Normal  Handedness: Right   Mood and Affect   Mood: Euthymic  Affect: Congruent   Thought Process  Thought Processes: Goal Directed  Descriptions of Associations:Intact  Orientation:Full (Time, Place and Person)  Thought Content:WDL  Diagnosis of Schizophrenia or Schizoaffective disorder in past: Yes  Duration of Psychotic Symptoms: Greater than six months   Hallucinations:Hallucinations: None  Ideas of Reference:None  Suicidal Thoughts:Suicidal Thoughts: No  Homicidal Thoughts:Homicidal Thoughts: No   Sensorium  Memory: Immediate Good; Recent Good; Remote Good  Judgment: Impaired  Insight: Poor   Executive Functions  Concentration: Fair  Attention Span: Fair  Recall: Fair  Fund of Knowledge: Fair  Language: Fair   Psychomotor Activity  Psychomotor Activity: Psychomotor Activity: Normal   Assets  Assets: Leisure Time; Resilience   Sleep  Sleep: Sleep: Fair Number of Hours of Sleep: 6   No data recorded  Physical Exam  Physical Exam Vitals and nursing note reviewed.  Eyes:     Pupils: Pupils are equal, round, and reactive to light.  Pulmonary:     Effort: Pulmonary effort is normal.  Skin:    General: Skin is dry.  Neurological:     Mental Status: He is alert and oriented to person, place, and time.    Review of Systems  Psychiatric/Behavioral:  Positive for substance abuse.   All other systems reviewed and are negative.  Blood pressure (!) 106/59, pulse (!) 51, temperature 97.8 F (36.6 C), temperature source Oral, resp. rate 16, SpO2 97%. There is no height or weight on file to calculate BMI.  Treatment Plan Summary: Recommend detox and substance abuse treatment.   Thomes Lolling, NP 08/05/2023 9:13 AM

## 2023-08-05 NOTE — ED Notes (Signed)
Pt provided lunch.

## 2023-08-06 NOTE — ED Notes (Signed)
Patient resting with eyes closed. Respirations even and unlabored. No acute distress noted. Environment secured. Will continue to monitor for safety.

## 2023-08-06 NOTE — ED Provider Notes (Signed)
Behavioral Health Progress Note  Date and Time: 08/06/2023 10:02 AM Name: Mark Hammond MRN:  [id number]  Subjective:  Mark Hammond, 32 y.o., male patient seen face to face by this provider, consulted with Dr. Dairl Ponder; and chart reviewed on 08/06/23.  On evaluation Mark Hammond reports he still wants help stopping his substance use.  He continues to endorse he "uses everything" and gets irritated when asked to quantify his use.  Patient states he just needs a place to stay and get clean.  He says he often has auditory and visual hallucinations, but he is not experiencing any at this moment. He denies suicidal or homicidal ideation.   During evaluation Mark Hammond is laying on the lounge chair in no acute distress.  He is alert, oriented x 4, calm, cooperative and attentive.  His mood is euthymic with congruent affect.  He has garbled speech, and normal behavior.  Objectively there is no evidence of psychosis/mania or delusional thinking.  Patient is able to converse coherently, goal directed thoughts, no distractibility, or pre-occupation.  He denies suicidal/self-harm/homicidal ideation, psychosis, and paranoia.  Patient answered questions appropriately.     Patient could benefit from detox and substance abuse treatment. He continues to await placement.  Diagnosis:  Final diagnoses:  Schizoaffective disorder, unspecified type (HCC)  Alcohol use disorder  Sedative, hypnotic, or anxiolytic use disorder (HCC)  Stimulant use disorder  Opioid use disorder  Tobacco use disorder  Cannabis use disorder    Total Time spent with patient: 20 minutes  Past Psychiatric History: Bipolar 1, MDD, Schizoaffective disorder, Polysubstance abuse, prior inpatient stay  Past Medical History: Alcoholic gastritis and dental abscess  Family History: None noted Family Psychiatric  History: None noted  Additional Social History: Homeless                        Sleep: Good  Appetite:   Good  Current Medications:  Current Facility-Administered Medications  Medication Dose Route Frequency Provider Last Rate Last Admin   acetaminophen (TYLENOL) tablet 650 mg  650 mg Oral Q6H PRN Augusto Gamble, MD       alum & mag hydroxide-simeth (MAALOX/MYLANTA) 200-200-20 MG/5ML suspension 30 mL  30 mL Oral Q4H PRN Augusto Gamble, MD       baclofen (LIORESAL) tablet 5 mg  5 mg Oral Q8H PRN Augusto Gamble, MD       bismuth subsalicylate (PEPTO BISMOL) chewable tablet 524 mg  524 mg Oral Q3H PRN Augusto Gamble, MD       cloNIDine (CATAPRES) tablet 0.1 mg  0.1 mg Oral Q4H PRN Augusto Gamble, MD   0.1 mg at 08/04/23 2218   dicyclomine (BENTYL) tablet 20 mg  20 mg Oral Q6H PRN Augusto Gamble, MD       hydrOXYzine (ATARAX) tablet 25 mg  25 mg Oral TID PRN Augusto Gamble, MD   25 mg at 08/04/23 2218   LORazepam (ATIVAN) tablet 1 mg  1 mg Oral Q6H PRN Augusto Gamble, MD       Or   LORazepam (ATIVAN) injection 1 mg  1 mg Intramuscular Q6H PRN Augusto Gamble, MD       LORazepam (ATIVAN) tablet 1 mg  1 mg Oral BID Augusto Gamble, MD   1 mg at 08/06/23 0911   Followed by   Melene Muller ON 08/07/2023] LORazepam (ATIVAN) tablet 1 mg  1 mg Oral Once Augusto Gamble, MD       melatonin tablet 3 mg  3 mg  Oral QHS PRN Augusto Gamble, MD   3 mg at 08/05/23 2336   thiamine (VITAMIN B1) tablet 100 mg  100 mg Oral Daily Augusto Gamble, MD   100 mg at 08/06/23 0911   And   multivitamin with minerals tablet 1 tablet  1 tablet Oral Daily Augusto Gamble, MD   1 tablet at 08/06/23 0912   naproxen (NAPROSYN) tablet 500 mg  500 mg Oral BID PRN Augusto Gamble, MD   500 mg at 08/04/23 2218   nicotine (NICODERM CQ - dosed in mg/24 hours) patch 21 mg  21 mg Transdermal Daily PRN Augusto Gamble, MD       nicotine polacrilex (NICORETTE) gum 2 mg  2 mg Oral PRN Augusto Gamble, MD       ondansetron [facility]) tablet 8 mg  8 mg Oral Q8H PRN Augusto Gamble, MD   8 mg at 08/04/23 2218   polyethylene glycol (MIRALAX / GLYCOLAX) packet 17 g  17 g Oral Daily PRN Augusto Gamble, MD       senna  Mancel Parsons) tablet 8.6 mg  1 tablet Oral QHS PRN Augusto Gamble, MD       Current Outpatient Medications  Medication Sig Dispense Refill   aspirin 325 MG tablet Take 650-1,300 mg by mouth every 6 (six) hours as needed (For pain).     OLANZapine zydis (ZYPREXA) 5 MG disintegrating tablet Take 1 tablet (5 mg total) by mouth 2 (two) times daily. (Patient not taking: Reported on 08/04/2023) 10 tablet 0    Labs  Lab Results:  Admission on 08/04/2023  Component Date Value Ref Range Status   WBC 08/04/2023 5.8  4.0 - 10.5 K/uL Final   RBC 08/04/2023 3.93 (L)  4.22 - 5.81 MIL/uL Final   Hemoglobin 08/04/2023 12.9 (L)  13.0 - 17.0 g/dL Final   HCT 81/19/1478 36.4 (L)  39.0 - 52.0 % Final   MCV 08/04/2023 92.6  80.0 - 100.0 fL Final   MCH 08/04/2023 32.8  26.0 - 34.0 pg Final   MCHC 08/04/2023 35.4  30.0 - 36.0 g/dL Final   RDW 29/56/2130 12.7  11.5 - 15.5 % Final   Platelets 08/04/2023 147 (L)  150 - 400 K/uL Final   Comment: SPECIMEN CHECKED FOR CLOTS REPEATED TO VERIFY PLATELET COUNT CONFIRMED BY SMEAR    nRBC 08/04/2023 0.0  0.0 - 0.2 % Final   Neutrophils Relative % 08/04/2023 76  % Final   Neutro Abs 08/04/2023 4.4  1.7 - 7.7 K/uL Final   Lymphocytes Relative 08/04/2023 16  % Final   Lymphs Abs 08/04/2023 0.9  0.7 - 4.0 K/uL Final   Monocytes Relative 08/04/2023 6  % Final   Monocytes Absolute 08/04/2023 0.3  0.1 - 1.0 K/uL Final   Eosinophils Relative 08/04/2023 0  % Final   Eosinophils Absolute 08/04/2023 0.0  0.0 - 0.5 K/uL Final   Basophils Relative 08/04/2023 1  % Final   Basophils Absolute 08/04/2023 0.0  0.0 - 0.1 K/uL Final   Immature Granulocytes 08/04/2023 1  % Final   Abs Immature Granulocytes 08/04/2023 0.04  0.00 - 0.07 K/uL Final   Performed at [facility], [address] [place], [place] [address]   Sodium 08/04/2023 134 (L)  135 - 145 mmol/L Final   Potassium 08/04/2023 3.0 (L)  3.5 - 5.1 mmol/L Final   Chloride 08/04/2023 95 (L)  98 - 111 mmol/L Final   CO2  08/04/2023 24  22 - 32 mmol/L Final  Glucose, Bld 08/04/2023 115 (H)  70 - 99 mg/dL Final   Glucose reference range applies only to samples taken after fasting for at least 8 hours.   BUN 08/04/2023 5 (L)  6 - 20 mg/dL Final   Creatinine, Ser 08/04/2023 0.56 (L)  0.61 - 1.24 mg/dL Final   Calcium 91/47/8295 8.2 (L)  8.9 - 10.3 mg/dL Final   Total Protein 62/13/0865 7.7  6.5 - 8.1 g/dL Final   Albumin 78/46/9629 3.6  3.5 - 5.0 g/dL Final   AST 52/84/1324 205 (H)  15 - 41 U/L Final   ALT 08/04/2023 99 (H)  0 - 44 U/L Final   Alkaline Phosphatase 08/04/2023 98  38 - 126 U/L Final   Total Bilirubin 08/04/2023 1.0  0.3 - 1.2 mg/dL Final   GFR, Estimated 08/04/2023 >60  >60 mL/min Final   Comment: (NOTE) Calculated using the CKD-EPI Creatinine Equation (2021)    Anion gap 08/04/2023 15  5 - 15 Final   Performed at [facility], [address] [place], [place] [address]   Hgb A1c MFr Bld 08/04/2023 5.4  4.8 - 5.6 % Final   Comment: (NOTE) Pre diabetes:          5.7%-6.4%  Diabetes:              >6.4%  Glycemic control for   <7.0% adults with diabetes    Mean Plasma Glucose 08/04/2023 108.28  mg/dL Final   Performed at [facility], [address] [place], [place] [address]   TSH 08/04/2023 0.613  0.350 - 4.500 uIU/mL Final   Comment: Performed by a 3rd Generation assay with a functional sensitivity of <=0.01 uIU/mL. Performed at [facility], [address] [place], [place] [address]    RPR Ser Ql 08/04/2023 NON REACTIVE  NON REACTIVE Final   Performed at [facility], [address] [place], [place] [address]   HIV-1 P24 Antigen - HIV24 08/04/2023 NON REACTIVE  NON REACTIVE Final   Comment: (NOTE) Detection of p24 may be inhibited by biotin in the sample, causing false negative results in acute infection.    HIV 1/2 Antibodies 08/04/2023 NON REACTIVE  NON REACTIVE Final   Interpretation (HIV Ag Ab) 08/04/2023 A non reactive test result means that HIV 1  or HIV 2 antibodies and HIV 1 p24 antigen were not detected in the specimen.   Final   Performed at [facility], [address] [place], [place] [address]   Hepatitis B Surface Ag 08/04/2023 NON REACTIVE  NON REACTIVE Final   HCV Ab 08/04/2023 Reactive (A)  NON REACTIVE Final   Comment: (NOTE) The CDC recommends that a Reactive HCV antibody result be followed up  with a HCV Nucleic Acid Amplification test.     Hep A IgM 08/04/2023 NON REACTIVE  NON REACTIVE Final   Hep B C IgM 08/04/2023 NON REACTIVE  NON REACTIVE Final   Performed at [facility], [address] [place], [place] [address]   POC Amphetamine UR 08/04/2023 None Detected  NONE DETECTED (Cut Off Level 1000 ng/mL) Final   POC Secobarbital (BAR) 08/04/2023 None Detected  NONE DETECTED (Cut Off Level 300 ng/mL) Final   POC Buprenorphine (BUP) 08/04/2023 None Detected  NONE DETECTED (Cut Off Level 10 ng/mL) Final   POC Oxazepam (BZO) 08/04/2023 None Detected  NONE DETECTED (Cut Off Level 300 ng/mL) Final   POC Cocaine UR 08/04/2023 Positive (A)  NONE DETECTED (Cut Off Level 300 ng/mL) Final   POC Methamphetamine UR 08/04/2023 None Detected  NONE DETECTED (Cut Off Level 1000 ng/mL) Final   POC Morphine 08/04/2023 None Detected  NONE DETECTED (Cut Off Level 300 ng/mL) Final   POC Methadone UR 08/04/2023 None Detected  NONE DETECTED (Cut Off Level 300 ng/mL) Final   POC Oxycodone UR 08/04/2023 None Detected  NONE DETECTED (Cut Off Level 100 ng/mL) Final   POC Marijuana UR 08/04/2023 Positive (A)  NONE DETECTED (Cut Off Level 50 ng/mL) Final  Admission on 06/15/2023, Discharged on 06/16/2023  Component Date Value Ref Range Status   WBC 06/15/2023 5.8  4.0 - 10.5 K/uL Final   RBC 06/15/2023 4.61  4.22 - 5.81 MIL/uL Final   Hemoglobin 06/15/2023 15.5  13.0 - 17.0 g/dL Final   HCT 10/05/7251 46.0  39.0 - 52.0 % Final   MCV 06/15/2023 99.8  80.0 - 100.0 fL Final   MCH 06/15/2023 33.6  26.0 - 34.0 pg Final   MCHC  06/15/2023 33.7  30.0 - 36.0 g/dL Final   RDW 66/44/0347 13.3  11.5 - 15.5 % Final   Platelets 06/15/2023 244  150 - 400 K/uL Final   nRBC 06/15/2023 0.0  0.0 - 0.2 % Final   Neutrophils Relative % 06/15/2023 56  % Final   Neutro Abs 06/15/2023 3.3  1.7 - 7.7 K/uL Final   Lymphocytes Relative 06/15/2023 33  % Final   Lymphs Abs 06/15/2023 1.9  0.7 - 4.0 K/uL Final   Monocytes Relative 06/15/2023 6  % Final   Monocytes Absolute 06/15/2023 0.4  0.1 - 1.0 K/uL Final   Eosinophils Relative 06/15/2023 2  % Final   Eosinophils Absolute 06/15/2023 0.1  0.0 - 0.5 K/uL Final   Basophils Relative 06/15/2023 3  % Final   Basophils Absolute 06/15/2023 0.2 (H)  0.0 - 0.1 K/uL Final   Immature Granulocytes 06/15/2023 0  % Final   Abs Immature Granulocytes 06/15/2023 0.01  0.00 - 0.07 K/uL Final   Performed at [facility], [address] [place], [place] [address]   Sodium 06/15/2023 140  135 - 145 mmol/L Final   Potassium 06/15/2023 3.8  3.5 - 5.1 mmol/L Final   Chloride 06/15/2023 103  98 - 111 mmol/L Final   CO2 06/15/2023 28  22 - 32 mmol/L Final   Glucose, Bld 06/15/2023 108 (H)  70 - 99 mg/dL Final   Glucose reference range applies only to samples taken after fasting for at least 8 hours.   BUN 06/15/2023 5 (L)  6 - 20 mg/dL Final   Creatinine, Ser 06/15/2023 0.65  0.61 - 1.24 mg/dL Final   Calcium 63/87/5643 8.3 (L)  8.9 - 10.3 mg/dL Final   Total Protein 32/95/1884 8.6 (H)  6.5 - 8.1 g/dL Final   Albumin 16/60/6301 3.6  3.5 - 5.0 g/dL Final   AST 60/07/9322 296 (H)  15 - 41 U/L Final   ALT 06/15/2023 205 (H)  0 - 44 U/L Final   Alkaline Phosphatase 06/15/2023 145 (H)  38 - 126 U/L Final   Total Bilirubin 06/15/2023 0.4  0.3 - 1.2 mg/dL Final   GFR, Estimated 06/15/2023 >60  >60 mL/min Final   Comment: (NOTE) Calculated using the CKD-EPI Creatinine Equation (2021)    Anion gap 06/15/2023 9  5 - 15 Final   Performed at [facility], [address] [place], [place] [address]   Lipase 06/15/2023 27  11 -  51 U/L Final   Performed at [facility], [address] [place], [place] [address]   Specimen Source 06/16/2023 URINE, CLEAN CATCH   Final   Color, Urine 06/16/2023 YELLOW  YELLOW Final   APPearance 06/16/2023 HAZY (A)  CLEAR Final   Specific Gravity, Urine 06/16/2023 1.015  1.005 - 1.030 Final   pH 06/16/2023 6.0  5.0 - 8.0 Final   Glucose, UA 06/16/2023 NEGATIVE  NEGATIVE mg/dL Final   Hgb urine dipstick 06/16/2023 NEGATIVE  NEGATIVE Final   Bilirubin Urine 06/16/2023 NEGATIVE  NEGATIVE Final   Ketones, ur 06/16/2023 NEGATIVE  NEGATIVE mg/dL Final   Protein, ur 64/33/2951 NEGATIVE  NEGATIVE mg/dL Final   Nitrite 88/41/6606 NEGATIVE  NEGATIVE Final   Leukocytes,Ua 06/16/2023 NEGATIVE  NEGATIVE Final   RBC / HPF 06/16/2023 0-5  0 - 5 RBC/hpf Final   WBC, UA 06/16/2023 0-5  0 - 5 WBC/hpf Final   Comment:        Reflex urine culture not performed if WBC <=10, OR if Squamous epithelial cells >5. If Squamous epithelial cells >5 suggest recollection.    Bacteria, UA 06/16/2023 NONE SEEN  NONE SEEN Final   Squamous Epithelial / HPF 06/16/2023 0-5  0 - 5 /HPF Final   Mucus 06/16/2023 PRESENT   Final   Performed at [facility], [address], [place], [place] [address]   Alcohol, Ethyl (B) 06/15/2023 288 (H)  <10 mg/dL Final   Comment: (NOTE) Lowest detectable limit for serum alcohol is 10 mg/dL.  For medical purposes only. Performed at [facility], [address], [place], [place] [address]    Salicylate Lvl 06/15/2023 <7.0 (L)  7.0 - 30.0 mg/dL Final   Performed at [facility], [address] [place], [place] [address]   Acetaminophen (Tylenol), Serum 06/15/2023 <10 (L)  10 - 30 ug/mL Final   Comment: (NOTE) Therapeutic concentrations vary significantly. A range of 10-30 ug/mL  may be an effective concentration for many patients. However, some   are best treated at concentrations outside of this range. Acetaminophen concentrations >150 ug/mL at 4 hours after ingestion  and >50 ug/mL at 12 hours after ingestion are often associated with  toxic reactions.  Performed at [facility], [address], [place], [place] [address]    Opiates 06/16/2023 NONE DETECTED  NONE DETECTED Final   Cocaine 06/16/2023 POSITIVE (A)  NONE DETECTED Final   Benzodiazepines 06/16/2023 NONE DETECTED  NONE DETECTED Final   Amphetamines 06/16/2023 NONE DETECTED  NONE DETECTED Final   Tetrahydrocannabinol 06/16/2023 POSITIVE (A)  NONE DETECTED Final   Barbiturates 06/16/2023 NONE DETECTED  NONE DETECTED Final   Comment: (NOTE) DRUG SCREEN FOR MEDICAL PURPOSES ONLY.  IF CONFIRMATION IS NEEDED FOR ANY PURPOSE, NOTIFY LAB WITHIN 5 DAYS.  LOWEST DETECTABLE LIMITS FOR URINE DRUG SCREEN Drug Class                     Cutoff (ng/mL) Amphetamine and metabolites    1000 Barbiturate and metabolites    200 Benzodiazepine                 200 Opiates and metabolites        300 Cocaine and metabolites        300 THC                            50 Performed at  [facility], [address] [place], [place] [address]   Admission on 06/09/2023, Discharged on 06/10/2023  Component Date Value Ref Range Status   Lipase 06/09/2023 38  11 - 51 U/L Final   Performed at [facility], [address] [place], [place] [address]   Sodium 06/09/2023 137  135 - 145 mmol/L Final   Potassium 06/09/2023 3.6  3.5 - 5.1 mmol/L Final   Chloride 06/09/2023 100  98 - 111 mmol/L Final   CO2 06/09/2023 25  22 - 32 mmol/L Final   Glucose, Bld 06/09/2023 140 (H)  70 - 99 mg/dL Final   Glucose reference range applies only to samples taken after fasting for at least 8 hours.   BUN 06/09/2023 14  6 - 20 mg/dL Final   Creatinine, Ser 06/09/2023 0.75  0.61 - 1.24 mg/dL Final   Calcium 09/81/1914 8.6 (L)  8.9 - 10.3 mg/dL Final    Total Protein 06/09/2023 8.4 (H)  6.5 - 8.1 g/dL Final   Albumin 78/29/5621 3.6  3.5 - 5.0 g/dL Final   AST 30/86/5784 222 (H)  15 - 41 U/L Final   ALT 06/09/2023 176 (H)  0 - 44 U/L Final   Alkaline Phosphatase 06/09/2023 183 (H)  38 - 126 U/L Final   Total Bilirubin 06/09/2023 0.7  0.3 - 1.2 mg/dL Final   GFR, Estimated 06/09/2023 >60  >60 mL/min Final   Comment: (NOTE) Calculated using the CKD-EPI Creatinine Equation (2021)    Anion gap 06/09/2023 12  5 - 15 Final   Performed at [facility], [address], [place], [place] [address]   WBC 06/09/2023 10.3  4.0 - 10.5 K/uL Final   RBC 06/09/2023 4.30  4.22 - 5.81 MIL/uL Final   Hemoglobin 06/09/2023 14.7  13.0 - 17.0 g/dL Final   HCT 52/84/1324 42.1  39.0 - 52.0 % Final   MCV 06/09/2023 97.9  80.0 - 100.0 fL Final   MCH 06/09/2023 34.2 (H)  26.0 - 34.0 pg Final   MCHC 06/09/2023 34.9  30.0 - 36.0 g/dL Final   RDW 40/07/2724 12.8  11.5 - 15.5 % Final   Platelets 06/09/2023 315  150 - 400 K/uL Final   nRBC 06/09/2023 0.0  0.0 - 0.2 % Final   Performed at [facility], [address], [place], [place] [address]   Color, Urine 06/10/2023 YELLOW  YELLOW Final   APPearance 06/10/2023 CLEAR  CLEAR Final   Specific Gravity, Urine 06/10/2023 1.010  1.005 - 1.030 Final   pH 06/10/2023 5.0  5.0 - 8.0 Final   Glucose, UA 06/10/2023 NEGATIVE  NEGATIVE mg/dL Final   Hgb urine dipstick 06/10/2023 NEGATIVE  NEGATIVE Final   Bilirubin Urine 06/10/2023 NEGATIVE  NEGATIVE Final   Ketones, ur 06/10/2023 NEGATIVE  NEGATIVE mg/dL Final   Protein, ur 03/47/4259 NEGATIVE  NEGATIVE mg/dL Final   Nitrite 56/38/7564 NEGATIVE  NEGATIVE Final   Leukocytes,Ua 06/10/2023 NEGATIVE  NEGATIVE Final   Performed at [facility], [address] [place], [place] [address]  Admission on 06/09/2023, Discharged on 06/09/2023  Component Date Value Ref Range Status   Opiates 06/09/2023 NONE DETECTED  NONE  DETECTED Final   Cocaine 06/09/2023 POSITIVE (A)  NONE DETECTED Final   Benzodiazepines 06/09/2023 NONE DETECTED  NONE DETECTED Final   Amphetamines 06/09/2023 NONE DETECTED  NONE DETECTED Final   Tetrahydrocannabinol 06/09/2023 POSITIVE (A)  NONE DETECTED Final   Barbiturates 06/09/2023 NONE DETECTED  NONE DETECTED Final   Comment: (NOTE) DRUG SCREEN FOR MEDICAL PURPOSES ONLY.  IF CONFIRMATION IS NEEDED FOR ANY PURPOSE, NOTIFY LAB WITHIN 5 DAYS.  LOWEST DETECTABLE LIMITS FOR URINE DRUG SCREEN Drug Class                     Cutoff (ng/mL) Amphetamine and metabolites    1000 Barbiturate and metabolites    200 Benzodiazepine                 200 Opiates and metabolites        300 Cocaine and metabolites        300 THC                            50 Performed at [facility], [address] [place], [place] [address]    Color, Urine 06/09/2023 YELLOW  YELLOW Final   APPearance 06/09/2023 CLEAR  CLEAR Final   Specific Gravity, Urine 06/09/2023 1.016  1.005 - 1.030 Final   pH 06/09/2023 8.0  5.0 - 8.0 Final   Glucose, UA 06/09/2023 NEGATIVE  NEGATIVE mg/dL Final   Hgb urine dipstick 06/09/2023 NEGATIVE  NEGATIVE Final   Bilirubin Urine 06/09/2023 NEGATIVE  NEGATIVE Final   Ketones, ur 06/09/2023 NEGATIVE  NEGATIVE mg/dL Final   Protein, ur 60/45/4098 NEGATIVE  NEGATIVE mg/dL Final   Nitrite 11/91/4782 NEGATIVE  NEGATIVE Final   Leukocytes,Ua 06/09/2023 NEGATIVE  NEGATIVE Final   Performed at [facility], [address] [place], [place] [address]  Admission on 06/03/2023, Discharged on 06/05/2023  Component Date Value Ref Range Status   WBC 06/03/2023 6.4  4.0 - 10.5 K/uL Final   RBC 06/03/2023 3.88 (L)  4.22 - 5.81 MIL/uL Final   Hemoglobin 06/03/2023 13.3  13.0 - 17.0 g/dL Final   HCT 30/86/5784 38.0 (L)  39.0 - 52.0 % Final   MCV 06/03/2023 97.9  80.0 - 100.0 fL Final   MCH 06/03/2023 34.3 (H)  26.0 - 34.0 pg Final   MCHC 06/03/2023 35.0   30.0 - 36.0 g/dL Final   RDW 69/62/9528 13.0  11.5 - 15.5 % Final   Platelets 06/03/2023 233  150 - 400 K/uL Final   nRBC 06/03/2023 0.0  0.0 - 0.2 % Final   Neutrophils Relative % 06/03/2023 49  % Final   Neutro Abs 06/03/2023 3.2  1.7 - 7.7 K/uL Final   Lymphocytes Relative 06/03/2023 40  % Final   Lymphs Abs 06/03/2023 2.6  0.7 - 4.0 K/uL Final   Monocytes Relative 06/03/2023 7  % Final   Monocytes Absolute 06/03/2023 0.4  0.1 - 1.0 K/uL Final   Eosinophils Relative 06/03/2023 2  % Final   Eosinophils Absolute 06/03/2023 0.1  0.0 - 0.5 K/uL Final   Basophils Relative 06/03/2023 2  % Final   Basophils Absolute 06/03/2023 0.1  0.0 - 0.1 K/uL Final   Immature Granulocytes 06/03/2023 0  % Final   Abs Immature Granulocytes 06/03/2023 0.02  0.00 - 0.07 K/uL Final   Performed at [facility], [address], [place], [place] [address]   Sodium 06/03/2023 140  135 - 145 mmol/L Final   Potassium 06/03/2023 3.4 (L)  3.5 - 5.1 mmol/L Final   Chloride 06/03/2023 104  98 - 111 mmol/L Final   CO2 06/03/2023 26  22 - 32 mmol/L Final   Glucose, Bld 06/03/2023 112 (H)  70 -  99 mg/dL Final   Glucose reference range applies only to samples taken after fasting for at least 8 hours.   BUN 06/03/2023 5 (L)  6 - 20 mg/dL Final   Creatinine, Ser 06/03/2023 1.04  0.61 - 1.24 mg/dL Final   Calcium 16/07/9603 8.3 (L)  8.9 - 10.3 mg/dL Final   Total Protein 54/06/8118 7.0  6.5 - 8.1 g/dL Final   Albumin 14/78/2956 3.2 (L)  3.5 - 5.0 g/dL Final   AST 21/30/8657 258 (H)  15 - 41 U/L Final   ALT 06/03/2023 186 (H)  0 - 44 U/L Final   Alkaline Phosphatase 06/03/2023 107  38 - 126 U/L Final   Total Bilirubin 06/03/2023 0.8  0.3 - 1.2 mg/dL Final   GFR, Estimated 06/03/2023 >60  >60 mL/min Final   Comment: (NOTE) Calculated using the CKD-EPI Creatinine Equation (2021)    Anion gap 06/03/2023 10  5 - 15 Final   Performed at [facility], [address], [place], [place] [address]   Alcohol, Ethyl  (B) 06/03/2023 285 (H)  <10 mg/dL Final   Comment: (NOTE) Lowest detectable limit for serum alcohol is 10 mg/dL.  For medical purposes only. Performed at [facility], [address], [place], [place] [address]    POC Amphetamine UR 06/03/2023 None Detected  NONE DETECTED (Cut Off Level 1000 ng/mL) Final   POC Secobarbital (BAR) 06/03/2023 None Detected  NONE DETECTED (Cut Off Level 300 ng/mL) Final   POC Buprenorphine (BUP) 06/03/2023 None Detected  NONE DETECTED (Cut Off Level 10 ng/mL) Final   POC Oxazepam (BZO) 06/03/2023 None Detected  NONE DETECTED (Cut Off Level 300 ng/mL) Final   POC Cocaine UR 06/03/2023 Positive (A)  NONE DETECTED (Cut Off Level 300 ng/mL) Final   POC Methamphetamine UR 06/03/2023 None Detected  NONE DETECTED (Cut Off Level 1000 ng/mL) Final   POC Morphine 06/03/2023 Positive (A)  NONE DETECTED (Cut Off Level 300 ng/mL) Final   POC Methadone UR 06/03/2023 None Detected  NONE DETECTED (Cut Off Level 300 ng/mL) Final   POC Oxycodone UR 06/03/2023 None Detected  NONE DETECTED (Cut Off Level 100 ng/mL) Final   POC Marijuana UR 06/03/2023 Positive (A)  NONE DETECTED (Cut Off Level 50 ng/mL) Final  Admission on 05/28/2023, Discharged on 05/28/2023  Component Date Value Ref Range Status   WBC 05/28/2023 11.2 (H)  4.0 - 10.5 K/uL Final   RBC 05/28/2023 4.18 (L)  4.22 - 5.81 MIL/uL Final   Hemoglobin 05/28/2023 14.3  13.0 - 17.0 g/dL Final   HCT 41/32/4401 41.5  39.0 - 52.0 % Final   MCV 05/28/2023 99.3  80.0 - 100.0 fL Final   MCH 05/28/2023 34.2 (H)  26.0 - 34.0 pg Final   MCHC 05/28/2023 34.5  30.0 - 36.0 g/dL Final   RDW 02/72/5366 13.2  11.5 - 15.5 % Final   Platelets 05/28/2023 244  150 - 400 K/uL Final   nRBC 05/28/2023 0.0  0.0 - 0.2 % Final   Neutrophils Relative % 05/28/2023 65  % Final   Neutro Abs 05/28/2023 7.3  1.7 - 7.7 K/uL Final   Lymphocytes Relative 05/28/2023 27  % Final   Lymphs Abs 05/28/2023 3.0  0.7 - 4.0 K/uL Final   Monocytes  Relative 05/28/2023 6  % Final   Monocytes Absolute 05/28/2023 0.7  0.1 - 1.0 K/uL Final   Eosinophils Relative 05/28/2023 1  % Final   Eosinophils Absolute 05/28/2023 0.1  0.0 -  0.5 K/uL Final   Basophils Relative 05/28/2023 1  % Final   Basophils Absolute 05/28/2023 0.1  0.0 - 0.1 K/uL Final   Immature Granulocytes 05/28/2023 0  % Final   Abs Immature Granulocytes 05/28/2023 0.04  0.00 - 0.07 K/uL Final   Performed at [facility], [address] [place], [place] [address]   Sodium 05/28/2023 139  135 - 145 mmol/L Final   Potassium 05/28/2023 3.1 (L)  3.5 - 5.1 mmol/L Final   Chloride 05/28/2023 106  98 - 111 mmol/L Final   CO2 05/28/2023 22  22 - 32 mmol/L Final   Glucose, Bld 05/28/2023 100 (H)  70 - 99 mg/dL Final   Glucose reference range applies only to samples taken after fasting for at least 8 hours.   BUN 05/28/2023 6  6 - 20 mg/dL Final   Creatinine, Ser 05/28/2023 0.70  0.61 - 1.24 mg/dL Final   Calcium 08/65/7846 8.0 (L)  8.9 - 10.3 mg/dL Final   GFR, Estimated 05/28/2023 >60  >60 mL/min Final   Comment: (NOTE) Calculated using the CKD-EPI Creatinine Equation (2021)    Anion gap 05/28/2023 11  5 - 15 Final   Performed at [facility], [address], [place], [place] [address]   Alcohol, Ethyl (B) 05/28/2023 279 (H)  <10 mg/dL Final   Comment: (NOTE) Lowest detectable limit for serum alcohol is 10 mg/dL.  For medical purposes only. Performed at [facility], [address] [place], [place] [address]    Opiates 05/28/2023 POSITIVE (A)  NONE DETECTED Final   Cocaine 05/28/2023 POSITIVE (A)  NONE DETECTED Final   Benzodiazepines 05/28/2023 NONE DETECTED  NONE DETECTED Final   Amphetamines 05/28/2023 POSITIVE (A)  NONE DETECTED Final   Tetrahydrocannabinol 05/28/2023 POSITIVE (A)  NONE DETECTED Final   Barbiturates 05/28/2023 NONE DETECTED  NONE DETECTED Final   Comment: (NOTE) DRUG SCREEN FOR MEDICAL PURPOSES ONLY.  IF CONFIRMATION IS NEEDED FOR  ANY PURPOSE, NOTIFY LAB WITHIN 5 DAYS.  LOWEST DETECTABLE LIMITS FOR URINE DRUG SCREEN Drug Class                     Cutoff (ng/mL) Amphetamine and metabolites    1000 Barbiturate and metabolites    200 Benzodiazepine                 200 Opiates and metabolites        300 Cocaine and metabolites        300 THC                            50 Performed at [facility], [address] [place], [place] [address]   Admission on 05/12/2023, Discharged on 05/12/2023  Component Date Value Ref Range Status   Sodium 05/12/2023 133 (L)  135 - 145 mmol/L Final   Potassium 05/12/2023 3.1 (L)  3.5 - 5.1 mmol/L Final   Chloride 05/12/2023 97 (L)  98 - 111 mmol/L Final   CO2 05/12/2023 21 (L)  22 - 32 mmol/L Final   Glucose, Bld 05/12/2023 101 (H)  70 - 99 mg/dL Final   Glucose reference range applies only to samples taken after fasting for at least 8 hours.   BUN 05/12/2023 7  6 - 20 mg/dL Final   Creatinine, Ser 05/12/2023 0.61  0.61 - 1.24 mg/dL Final   Calcium 02/72/5366 8.7 (L)  8.9 -  10.3 mg/dL Final   Total Protein 40/98/1191 8.1  6.5 - 8.1 g/dL Final   Albumin 47/82/9562 3.6  3.5 - 5.0 g/dL Final   AST 13/05/6577 566 (H)  15 - 41 U/L Final   ALT 05/12/2023 436 (H)  0 - 44 U/L Final   Alkaline Phosphatase 05/12/2023 127 (H)  38 - 126 U/L Final   Total Bilirubin 05/12/2023 1.3 (H)  0.3 - 1.2 mg/dL Final   GFR, Estimated 05/12/2023 >60  >60 mL/min Final   Comment: (NOTE) Calculated using the CKD-EPI Creatinine Equation (2021)    Anion gap 05/12/2023 15  5 - 15 Final   Performed at [facility], [address] [place], [place] [address]   Alcohol, Ethyl (B) 05/12/2023 <10  <10 mg/dL Final   Comment: (NOTE) Lowest detectable limit for serum alcohol is 10 mg/dL.  For medical purposes only. Performed at [facility], [address] [place], [place] [address]    Opiates 05/12/2023 NONE DETECTED  NONE DETECTED Final   Cocaine 05/12/2023 POSITIVE (A)  NONE DETECTED  Final   Benzodiazepines 05/12/2023 NONE DETECTED  NONE DETECTED Final   Amphetamines 05/12/2023 POSITIVE (A)  NONE DETECTED Final   Tetrahydrocannabinol 05/12/2023 NONE DETECTED  NONE DETECTED Final   Barbiturates 05/12/2023 NONE DETECTED  NONE DETECTED Final   Comment: (NOTE) DRUG SCREEN FOR MEDICAL PURPOSES ONLY.  IF CONFIRMATION IS NEEDED FOR ANY PURPOSE, NOTIFY LAB WITHIN 5 DAYS.  LOWEST DETECTABLE LIMITS FOR URINE DRUG SCREEN Drug Class                     Cutoff (ng/mL) Amphetamine and metabolites    1000 Barbiturate and metabolites    200 Benzodiazepine                 200 Opiates and metabolites        300 Cocaine and metabolites        300 THC                            50 Performed at [facility], [address], [place], [place] [address]    WBC 05/12/2023 6.8  4.0 - 10.5 K/uL Final   RBC 05/12/2023 4.77  4.22 - 5.81 MIL/uL Final   Hemoglobin 05/12/2023 15.7  13.0 - 17.0 g/dL Final   HCT 10/05/7251 45.3  39.0 - 52.0 % Final   MCV 05/12/2023 95.0  80.0 - 100.0 fL Final   MCH 05/12/2023 32.9  26.0 - 34.0 pg Final   MCHC 05/12/2023 34.7  30.0 - 36.0 g/dL Final   RDW 66/44/0347 13.0  11.5 - 15.5 % Final   Platelets 05/12/2023 194  150 - 400 K/uL Final   nRBC 05/12/2023 0.0  0.0 - 0.2 % Final   Neutrophils Relative % 05/12/2023 73  % Final   Neutro Abs 05/12/2023 5.0  1.7 - 7.7 K/uL Final   Lymphocytes Relative 05/12/2023 17  % Final   Lymphs Abs 05/12/2023 1.1  0.7 - 4.0 K/uL Final   Monocytes Relative 05/12/2023 8  % Final   Monocytes Absolute 05/12/2023 0.5  0.1 - 1.0 K/uL Final   Eosinophils Relative 05/12/2023 1  % Final   Eosinophils Absolute 05/12/2023 0.1  0.0 - 0.5 K/uL Final   Basophils Relative 05/12/2023 1  % Final   Basophils Absolute 05/12/2023 0.1  0.0 - 0.1 K/uL Final   Immature Granulocytes  05/12/2023 0  % Final   Abs Immature Granulocytes 05/12/2023 0.03  0.00 - 0.07 K/uL Final   Performed at [facility], [address],  [place], [place] [address]   SARS Coronavirus 2 by RT PCR 05/12/2023 NEGATIVE  NEGATIVE Final   Performed at [facility], [address] [place], [place] [address]   Total CK 05/12/2023 438 (H)  49 - 397 U/L Final   Performed at [facility], [address], [place], [place] [address]  Admission on 03/05/2023, Discharged on 03/05/2023  Component Date Value Ref Range Status   Sodium 03/05/2023 137  135 - 145 mmol/L Final   Potassium 03/05/2023 3.2 (L)  3.5 - 5.1 mmol/L Final   Chloride 03/05/2023 102  98 - 111 mmol/L Final   CO2 03/05/2023 24  22 - 32 mmol/L Final   Glucose, Bld 03/05/2023 209 (H)  70 - 99 mg/dL Final   Glucose reference range applies only to samples taken after fasting for at least 8 hours.   BUN 03/05/2023 10  6 - 20 mg/dL Final   Creatinine, Ser 03/05/2023 0.78  0.61 - 1.24 mg/dL Final   Calcium 21/30/8657 8.1 (L)  8.9 - 10.3 mg/dL Final   Total Protein 84/69/6295 7.0  6.5 - 8.1 g/dL Final   Albumin 28/41/3244 3.1 (L)  3.5 - 5.0 g/dL Final   AST 10/05/7251 287 (H)  15 - 41 U/L Final   ALT 03/05/2023 261 (H)  0 - 44 U/L Final   Alkaline Phosphatase 03/05/2023 140 (H)  38 - 126 U/L Final   Total Bilirubin 03/05/2023 0.7  0.3 - 1.2 mg/dL Final   GFR, Estimated 03/05/2023 >60  >60 mL/min Final   Comment: (NOTE) Calculated using the CKD-EPI Creatinine Equation (2021)    Anion gap 03/05/2023 11  5 - 15 Final   Performed at [facility], [address] [place], [place] [address]   Alcohol, Ethyl (B) 03/05/2023 171 (H)  <10 mg/dL Final   Comment: (NOTE) Lowest detectable limit for serum alcohol is 10 mg/dL.  For medical purposes only. Performed at [facility], [address], [place], [place] [address]    WBC 03/05/2023 11.4 (H)  4.0 - 10.5 K/uL Final   RBC 03/05/2023 4.20 (L)  4.22 - 5.81 MIL/uL Final   Hemoglobin 03/05/2023 13.9  13.0 - 17.0 g/dL Final   HCT 59/56/3875 40.4  39.0 - 52.0 % Final   MCV 03/05/2023 96.2  80.0 - 100.0 fL Final    MCH 03/05/2023 33.1  26.0 - 34.0 pg Final   MCHC 03/05/2023 34.4  30.0 - 36.0 g/dL Final   RDW 64/33/2951 13.8  11.5 - 15.5 % Final   Platelets 03/05/2023 392  150 - 400 K/uL Final   nRBC 03/05/2023 0.0  0.0 - 0.2 % Final   Performed at [facility], [address], [place], [place] [address]   Opiates 03/05/2023 NONE DETECTED  NONE DETECTED Final   Cocaine 03/05/2023 POSITIVE (A)  NONE DETECTED Final   Benzodiazepines 03/05/2023 POSITIVE (A)  NONE DETECTED Final   Amphetamines 03/05/2023 NONE DETECTED  NONE DETECTED Final   Tetrahydrocannabinol 03/05/2023 POSITIVE (A)  NONE DETECTED Final   Barbiturates 03/05/2023 NONE DETECTED  NONE DETECTED Final   Comment: (NOTE) DRUG SCREEN FOR MEDICAL PURPOSES ONLY.  IF CONFIRMATION IS NEEDED FOR ANY PURPOSE, NOTIFY LAB WITHIN 5 DAYS.  LOWEST DETECTABLE LIMITS FOR URINE DRUG SCREEN Drug Class  Cutoff (ng/mL) Amphetamine and metabolites    1000 Barbiturate and metabolites    200 Benzodiazepine                 200 Opiates and metabolites        300 Cocaine and metabolites        300 THC                            50 Performed at [facility], [address], [place], [place] [address]   Admission on 02/23/2023, Discharged on 03/01/2023  Component Date Value Ref Range Status   Cholesterol 02/24/2023 147  0 - 200 mg/dL Final   Triglycerides 13/05/6577 96  <150 mg/dL Final   HDL 46/96/2952 42  >40 mg/dL Final   Total CHOL/HDL Ratio 02/24/2023 3.5  RATIO Final   VLDL 02/24/2023 19  0 - 40 mg/dL Final   LDL Cholesterol 02/24/2023 86  0 - 99 mg/dL Final   Comment:        Total Cholesterol/HDL:CHD Risk Coronary Heart Disease Risk Table                     Men   Women  1/2 Average Risk   3.4   3.3  Average Risk       5.0   4.4  2 X Average Risk   9.6   7.1  3 X Average Risk  23.4   11.0        Use the calculated Patient Ratio above and the CHD Risk Table to determine the patient's CHD Risk.        ATP III  CLASSIFICATION (LDL):  <100     mg/dL   Optimal  841-324  mg/dL   Near or Above                    Optimal  130-159  mg/dL   Borderline  401-027  mg/dL   High  >253     mg/dL   Very High Performed at [facility], [address], [place], [place] [address]    Hgb A1c MFr Bld 02/24/2023 5.5  4.8 - 5.6 % Final   Comment: (NOTE)         Prediabetes: 5.7 - 6.4         Diabetes: >6.4         Glycemic control for adults with diabetes: <7.0    Mean Plasma Glucose 02/24/2023 111  mg/dL Final   Comment: (NOTE) Performed At: [facility] [address] [place], [place] [id number] Jolene Schimke MD LO:[id number]    Hepatitis B Surface Ag 02/25/2023 NON REACTIVE  NON REACTIVE Final   HCV Ab 02/25/2023 Reactive (A)  NON REACTIVE Final   Comment: (NOTE) The CDC recommends that a Reactive HCV antibody result be followed up  with a HCV Nucleic Acid Amplification test.     Hep A IgM 02/25/2023 NON REACTIVE  NON REACTIVE Final   Hep B C IgM 02/25/2023 NON REACTIVE  NON REACTIVE Final   Performed at [facility], [address], [place], [place] [address]   Potassium 02/25/2023 4.4  3.5 - 5.1 mmol/L Final   Performed at [facility], [address] [place], [place] [address]   HCV RNA, Quantitation 02/27/2023 See Final Results  IU/mL Final   Test Information: 02/27/2023 Comment   Final  Comment: (NOTE) The quantitative range of this assay is 15 IU/mL to 100 million IU/mL. Performed At: [facility] [address] [place], [place] [id number] Jolene Schimke MD AO:[id number]    Sodium 02/27/2023 138  135 - 145 mmol/L Final   Potassium 02/27/2023 4.1  3.5 - 5.1 mmol/L Final   Chloride 02/27/2023 102  98 - 111 mmol/L Final   CO2 02/27/2023 28  22 - 32 mmol/L Final   Glucose, Bld 02/27/2023 96  70 - 99 mg/dL Final   Glucose reference range applies only to samples taken after fasting for at least 8 hours.   BUN 02/27/2023 9  6 - 20  mg/dL Final   Creatinine, Ser 02/27/2023 0.75  0.61 - 1.24 mg/dL Final   Calcium 96/29/5284 8.4 (L)  8.9 - 10.3 mg/dL Final   Total Protein 13/24/4010 7.1  6.5 - 8.1 g/dL Final   Albumin 27/25/3664 2.9 (L)  3.5 - 5.0 g/dL Final   AST 40/34/7425 168 (H)  15 - 41 U/L Final   ALT 02/27/2023 193 (H)  0 - 44 U/L Final   Alkaline Phosphatase 02/27/2023 97  38 - 126 U/L Final   Total Bilirubin 02/27/2023 0.5  0.3 - 1.2 mg/dL Final   GFR, Estimated 02/27/2023 >60  >60 mL/min Final   Comment: (NOTE) Calculated using the CKD-EPI Creatinine Equation (2021)    Anion gap 02/27/2023 8  5 - 15 Final   Performed at [facility], [address], [place], [place] [address]   HCV RNA - Quantitation 02/27/2023 6,550,000  IU/mL Final   Comment: (NOTE)                HCV RNA detected HCV RNA viral loads >/= 25 IU/mL indicate current HCV infection.    HCV RNA - log10 02/27/2023 6.816  log10 IU/mL Final   Comment: (NOTE) Performed At: [facility] [address] [place], [place] [id number] Jolene Schimke MD JO:[id number]   Admission on 02/23/2023, Discharged on 02/23/2023  Component Date Value Ref Range Status   Acetaminophen (Tylenol), Serum 02/23/2023 <10 (L)  10 - 30 ug/mL Final   Comment: (NOTE) Therapeutic concentrations vary significantly. A range of 10-30 ug/mL  may be an effective concentration for many patients. However, some  are best treated at concentrations outside of this range. Acetaminophen concentrations >150 ug/mL at 4 hours after ingestion  and >50 ug/mL at 12 hours after ingestion are often associated with  toxic reactions.  Performed at [facility], [address] [place], [place] [address]    Sodium 02/23/2023 138  135 - 145 mmol/L Final   Potassium 02/23/2023 3.1 (L)  3.5 - 5.1 mmol/L Final   Chloride 02/23/2023 103  98 - 111 mmol/L Final   CO2 02/23/2023 24  22 - 32 mmol/L Final   Glucose, Bld 02/23/2023 115 (H)  70 - 99  mg/dL Final   Glucose reference range applies only to samples taken after fasting for at least 8 hours.   BUN 02/23/2023 9  6 - 20 mg/dL Final   Creatinine, Ser 02/23/2023 0.75  0.61 - 1.24 mg/dL Final   Calcium 32/35/5732 8.2 (L)  8.9 - 10.3 mg/dL Final   Total Protein 20/25/4270 7.6  6.5 - 8.1 g/dL Final   Albumin 62/37/6283 3.3 (L)  3.5 - 5.0 g/dL Final   AST 15/17/6160 168 (H)  15 - 41 U/L Final   ALT 02/23/2023 138 (H)  0 - 44 U/L Final  Alkaline Phosphatase 02/23/2023 127 (H)  38 - 126 U/L Final   Total Bilirubin 02/23/2023 0.6  0.3 - 1.2 mg/dL Final   GFR, Estimated 02/23/2023 >60  >60 mL/min Final   Comment: (NOTE) Calculated using the CKD-EPI Creatinine Equation (2021)    Anion gap 02/23/2023 11  5 - 15 Final   Performed at [facility], [address] [place], [place] [address]   Alcohol, Ethyl (B) 02/23/2023 107 (H)  <10 mg/dL Final   Comment: (NOTE) Lowest detectable limit for serum alcohol is 10 mg/dL.  For medical purposes only. Performed at [facility], [address], [place], [place] [address]    WBC 02/23/2023 9.3  4.0 - 10.5 K/uL Final   RBC 02/23/2023 4.19 (L)  4.22 - 5.81 MIL/uL Final   Hemoglobin 02/23/2023 13.9  13.0 - 17.0 g/dL Final   HCT 65/78/4696 40.5  39.0 - 52.0 % Final   MCV 02/23/2023 96.7  80.0 - 100.0 fL Final   MCH 02/23/2023 33.2  26.0 - 34.0 pg Final   MCHC 02/23/2023 34.3  30.0 - 36.0 g/dL Final   RDW 29/52/8413 14.3  11.5 - 15.5 % Final   Platelets 02/23/2023 227  150 - 400 K/uL Final   nRBC 02/23/2023 0.0  0.0 - 0.2 % Final   Neutrophils Relative % 02/23/2023 61  % Final   Neutro Abs 02/23/2023 5.6  1.7 - 7.7 K/uL Final   Lymphocytes Relative 02/23/2023 31  % Final   Lymphs Abs 02/23/2023 2.9  0.7 - 4.0 K/uL Final   Monocytes Relative 02/23/2023 6  % Final   Monocytes Absolute 02/23/2023 0.5  0.1 - 1.0 K/uL Final   Eosinophils Relative 02/23/2023 1  % Final   Eosinophils Absolute 02/23/2023 0.1   0.0 - 0.5 K/uL Final   Basophils Relative 02/23/2023 1  % Final   Basophils Absolute 02/23/2023 0.1  0.0 - 0.1 K/uL Final   Immature Granulocytes 02/23/2023 0  % Final   Abs Immature Granulocytes 02/23/2023 0.03  0.00 - 0.07 K/uL Final   Performed at [facility], [address] [place], [place] [address]   Salicylate Lvl 02/23/2023 <7.0 (L)  7.0 - 30.0 mg/dL Final   Performed at [facility], [address], [place], [place] [address]   Opiates 02/23/2023 NONE DETECTED  NONE DETECTED Final   Cocaine 02/23/2023 POSITIVE (A)  NONE DETECTED Final   Benzodiazepines 02/23/2023 NONE DETECTED  NONE DETECTED Final   Amphetamines 02/23/2023 NONE DETECTED  NONE DETECTED Final   Tetrahydrocannabinol 02/23/2023 POSITIVE (A)  NONE DETECTED Final   Barbiturates 02/23/2023 NONE DETECTED  NONE DETECTED Final   Comment: (NOTE) DRUG SCREEN FOR MEDICAL PURPOSES ONLY.  IF CONFIRMATION IS NEEDED FOR ANY PURPOSE, NOTIFY LAB WITHIN 5 DAYS.  LOWEST DETECTABLE LIMITS FOR URINE DRUG SCREEN Drug Class                     Cutoff (ng/mL) Amphetamine and metabolites    1000 Barbiturate and metabolites    200 Benzodiazepine                 200 Opiates and metabolites        300 Cocaine and metabolites        300 THC                            50 Performed at [facility], [address], [place], [place] [address]   There may be more visits with results that are not included.    Blood Alcohol level:  Lab Results  Component Value Date   ETH 288 (H) 06/15/2023   ETH 285 (H) 06/03/2023    Metabolic Disorder Labs: Lab Results  Component Value Date   HGBA1C 5.4 08/04/2023   MPG 108.28 08/04/2023   MPG 111 02/24/2023   Lab Results  Component Value Date   PROLACTIN 31.4 (H) 11/16/2019   Lab Results  Component Value Date   CHOL 147 02/24/2023   TRIG 96 02/24/2023   HDL 42 02/24/2023   CHOLHDL 3.5 02/24/2023   VLDL 19 02/24/2023   LDLCALC  86 02/24/2023   LDLCALC 57 01/14/2023    Therapeutic Lab Levels: No results found for: "LITHIUM" No results found for: "VALPROATE" No results found for: "CBMZ"  Physical Findings   AIMS    Flowsheet Row [facility]) [facility] Ad[facility] (Discharged) [facility] Ad[facility] (Discharged) [facility]  AIMS Total Score 0 0 0      AUDIT    Flowsheet Row Ad[facility]) [facility] Admission (Discharged) [facility] Admission (Discharged) from OP [facility] Ad[facility] (Discharged[facility]  Alcohol Use Disorder Identification Test Final Score (AUDIT) 26 26 13  37      PHQ2-9    Flowsheet Row ED from 06/03/2023 in [facility]  PHQ-2 Total Score 0      Flowsheet Row ED from 08/04/2023 in [facility] ED from 06/15/2023 in [facility] at [facility] ED from 06/09/2023 in [facility] Department at [facility]  C-SSRS RISK CATEGORY No Risk Error: Question 6 not populated Error: Q3, 4, or 5 should not be populated when Q2 is No        Musculoskeletal  Strength & Muscle Tone: within normal limits Gait & Station: normal Patient leans: N/A  Psychiatric Specialty Exam  Presentation  General Appearance:  Disheveled  Eye Contact: Good  Speech: Slurred  Speech Volume: Normal  Handedness: Right   Mood and Affect  Mood: Euthymic  Affect: Congruent   Thought Process  Thought Processes: Goal Directed  Descriptions of Associations:Intact  Orientation:Full (Time, Place and  Person)  Thought Content:WDL  Diagnosis of Schizophrenia or Schizoaffective disorder in past: Yes  Duration of Psychotic Symptoms: Greater than six months   Hallucinations:Hallucinations: None  Ideas of Reference:None  Suicidal Thoughts:Suicidal Thoughts: No  Homicidal Thoughts:Homicidal Thoughts: No   Sensorium  Memory: Immediate Good; Recent Good; Remote Good  Judgment: Impaired  Insight: Poor   Executive Functions  Concentration: Fair  Attention Span: Fair  Recall: Fair  Fund of Knowledge: Fair  Language: Fair   Psychomotor Activity  Psychomotor Activity: Psychomotor Activity: Normal   Assets  Assets: Leisure Time; Desire for Improvement   Sleep  Sleep: Sleep: Good Number of Hours of Sleep: 8   No data recorded  Physical Exam  Physical Exam Vitals and nursing note reviewed.  Eyes:     Pupils: Pupils are equal, round, and reactive to light.  Pulmonary:     Effort: Pulmonary effort is normal.  Skin:  General: Skin is dry.  Neurological:     Mental Status: He is alert and oriented to person, place, and time.    Review of Systems  Psychiatric/Behavioral:  Positive for substance abuse.   All other systems reviewed and are negative.  Blood pressure 110/68, pulse 67, temperature 97.8 F (36.6 C), temperature source Oral, resp. rate 16, SpO2 98%. There is no height or weight on file to calculate BMI.  Treatment Plan Summary: Awaiting placement for detox and substance abuse treatment  Thomes Lolling, NP 08/06/2023 10:02 AM

## 2023-08-06 NOTE — ED Notes (Signed)
Pt using the telephone. No signs of distress noted. Will continue to monitor for safety.

## 2023-08-06 NOTE — ED Notes (Signed)
Patient A&Ox4. Denies intent to harm self/others when asked. Denies A/VH. Patient denies any physical complaints when asked. No acute distress noted. Support and encouragement provided. Routine safety checks conducted according to facility protocol. Encouraged patient to notify staff if thoughts of harm toward self or others arise. Endorses safety. Patient verbalized understanding and agreement. Will continue to monitor for safety.

## 2023-08-06 NOTE — ED Notes (Signed)
The patient's brother called to report that the patient had reached out to him, requesting that he come to pick him up. The brother expressed concerns, stating that the patient has several mental health issues and that he does not feel it is safe to take him back home.

## 2023-08-06 NOTE — ED Notes (Signed)
Pt in recliner bed, awake , watching tv. Denies S/S of withdrawal. Denies c/o pain. No noted distress. He is pleasant, answers questions with short answers. Will continue to monitor for safety

## 2023-08-06 NOTE — Progress Notes (Signed)
Pt gives consent to give information to his uncle Casimiro Needle "Kathlene November" [place] Knife if he were to call here.

## 2023-08-06 NOTE — ED Notes (Signed)
Pt toileted. Snack given, and fluids offered. No c/o at present. No signs of distress noted. Will continue to monitor for safety.

## 2023-08-06 NOTE — ED Notes (Signed)
Pt sleeping@this time breathing even and unlabored will continue to monitor for safety 

## 2023-08-07 LAB — GC/CHLAMYDIA PROBE AMP (~~LOC~~) NOT AT ARMC
Chlamydia: NEGATIVE
Comment: NEGATIVE
Comment: NORMAL
Neisseria Gonorrhea: NEGATIVE

## 2023-08-07 MED ORDER — NICOTINE POLACRILEX 2 MG MT GUM: 2.0000 mg | CHEWING_GUM | OROMUCOSAL | Status: DC | PRN

## 2023-08-07 MED ORDER — MELATONIN 3 MG PO TABS: 3.0000 mg | ORAL_TABLET | Freq: Every evening | ORAL | Status: DC | PRN

## 2023-08-07 MED ORDER — NICOTINE 21 MG/24HR TD PT24: 21.0000 mg | MEDICATED_PATCH | Freq: Every day | TRANSDERMAL | 0 refills | Status: DC | PRN

## 2023-08-07 MED ORDER — NALOXONE HCL 4 MG/0.1ML NA LIQD: 1.0000 | Freq: Once | NASAL | 0 refills | Status: AC

## 2023-08-07 NOTE — Progress Notes (Signed)
Pt is awake, alert and oriented x3. Pt did not voice any complaints of pain or discomfort. No signs of distress noted. Administered scheduled meds per order. Pt denies current SI/HI/AVH, plan or intent. Staff will monitor for pt's safety.

## 2023-08-07 NOTE — ED Provider Notes (Cosign Needed Addendum)
FBC/OBS ASAP Discharge Summary  Date: 08/07/23 Name: Mark Hammond MRN: [id number]  Discharge Diagnoses:  Final diagnoses:  Schizoaffective disorder, unspecified type (HCC)  Alcohol use disorder  Sedative, hypnotic, or anxiolytic use disorder (HCC)  Stimulant use disorder  Opioid use disorder  Tobacco use disorder  Cannabis use disorder    Mark Hammond is a 32 y.o. male who is experiencing homelessness with a past psychiatric history of schizoaffective disorder and x5 prior inpatient psychiatric hospitalizations  and substance use history of alcohol use disorder, opioid use disorder, cannabis use disorder, stimulant use disorder, and tobacco use disorder who was admitted voluntarily as a direct admit presenting by self from the community to [facility]) (admitted on 08/04/2023, total  LOS: 0 days ) for assessment and treatment of substance abuse and risk of withdrawal symptoms.   Subjective: Patient is not interested in residential placement. Patient denies having suicidal or homicidal thoughts. He is not hearing any voices others don't hear or seeing things others don't see. He denies experiencing any withdrawal symptoms.  Stay Summary: During the patient's hospitalization, patient had extensive initial psychiatric evaluation, and follow-up psychiatric evaluations every day.  The following meds were provided and managed during his stay. Scheduled Meds:  thiamine  100 mg Oral Daily   And   multivitamin with minerals  1 tablet Oral Daily   Continuous Infusions: PRN Meds:.acetaminophen, alum & mag hydroxide-simeth, baclofen, bismuth subsalicylate, cloNIDine, dicyclomine, hydrOXYzine, LORazepam **OR** LORazepam, melatonin, naproxen, nicotine, nicotine polacrilex, ondansetron, polyethylene glycol, senna  Patient's care was discussed during the interdisciplinary team meeting every day during the hospitalization.  The patient denies any side effects to prescribed  psychiatric medication.  Gradually, patient started adjusting to milieu. The patient was evaluated each day by a clinical provider to ascertain response to treatment. Improvement was noted by the patient's report of decreasing symptoms, improved sleep and appetite, affect, medication tolerance, behavior, and participation in unit programming.  Patient was asked each day to complete a self inventory noting mood, mental status, pain, new symptoms, anxiety and concerns.   Symptoms were reported as significantly decreased or resolved completely by discharge.  The patient reports that their mood is stable.  The patient denied having suicidal thoughts for more than 48 hours prior to discharge.  Patient denies having homicidal thoughts.  Patient denies having auditory hallucinations.  Patient denies any visual hallucinations or other symptoms of psychosis.  The patient was motivated to continue taking medication with a goal of continued improvement in mental health.   Symptoms were reported as significantly decreased or resolved completely by discharge.   On day of discharge, the patient reports that their mood is stable. The patient denied having suicidal thoughts for more than 48 hours prior to discharge.  Patient denies having homicidal thoughts.  Patient denies having auditory hallucinations.  Patient denies any visual hallucinations or other symptoms of psychosis. The patient was motivated to continue taking medication with a goal of continued improvement in mental health.   The patient reports their target psychiatric symptoms of substance withdrawal symptoms responded well to the psychiatric medications, and the patient reports overall benefit other psychiatric hospitalization. Supportive psychotherapy was provided to the patient. The patient also participated in regular group therapy while hospitalized. Coping skills, problem solving as well as relaxation therapies were also part of the unit  programming.  Labs were reviewed with the patient, and abnormal results were discussed with the patient.  The patient is able to verbalize their individual safety  plan to this provider.  # It is recommended to the patient to continue psychiatric medications as prescribed, after discharge from the hospital.    # It is recommended to the patient to follow up with your outpatient psychiatric provider and PCP.  # It was discussed with the patient, the impact of alcohol, drugs, tobacco have been there overall psychiatric and medical wellbeing, and total abstinence from substance use was recommended the patient.ed.  # Prescriptions provided or sent directly to preferred pharmacy at discharge. Patient agreeable to plan. Given opportunity to ask questions. Appears to feel comfortable with discharge.    # In the event of worsening symptoms, the patient is instructed to call the crisis hotline, 911 and or go to the nearest ED for appropriate evaluation and treatment of symptoms. To follow-up with primary care provider for other medical issues, concerns and or health care needs  # Patient was discharged to the community with plans to follow up provided in the discharge instructions.  On day of discharge patient is not suicidal or homicidal. He is not exhibiting bizarre behavior nor endorsing auditory or visual hallucinations. He is not exhibiting any life-threatening substance withdrawal symptoms. At present, there are no indications to hold patient involuntarily.  Total Time spent with patient: 30 minutes  Past Psychiatric History: previously diagnosed with schizoaffective disorder  Past Medical History:         Past Medical History:  Diagnosis Date   Alcoholic gastritis     Bipolar 1 disorder (HCC)     Dental abscess     Depression     Drug abuse (HCC)     ETOH abuse     Hallucination     Schizo affective schizophrenia (HCC)           History reviewed. No pertinent surgical history.        Family History:           Family History  Problem Relation Age of Onset   Stroke Other             Additional Social History: Marital status: Single Are you sexually active?: Yes What is your sexual orientation?: Straight Has your sexual activity been affected by drugs, alcohol, medication, or emotional stress?: Denies Does patient have children?: No Tobacco Cessation:  A prescription for an FDA-approved tobacco cessation medication provided at discharge  Current Medications:  Current Facility-Administered Medications  Medication Dose Route Frequency Provider Last Rate Last Admin   acetaminophen (TYLENOL) tablet 650 mg  650 mg Oral Q6H PRN Augusto Gamble, MD       alum & mag hydroxide-simeth (MAALOX/MYLANTA) 200-200-20 MG/5ML suspension 30 mL  30 mL Oral Q4H PRN Augusto Gamble, MD       baclofen (LIORESAL) tablet 5 mg  5 mg Oral Q8H PRN Augusto Gamble, MD       bismuth subsalicylate (PEPTO BISMOL) chewable tablet 524 mg  524 mg Oral Q3H PRN Augusto Gamble, MD       cloNIDine (CATAPRES) tablet 0.1 mg  0.1 mg Oral Q4H PRN Augusto Gamble, MD   0.1 mg at 08/04/23 2218   dicyclomine (BENTYL) tablet 20 mg  20 mg Oral Q6H PRN Augusto Gamble, MD       hydrOXYzine (ATARAX) tablet 25 mg  25 mg Oral TID PRN Augusto Gamble, MD   25 mg at 08/06/23 2147   LORazepam (ATIVAN) tablet 1 mg  1 mg Oral Q6H PRN Augusto Gamble, MD       Or  LORazepam (ATIVAN) injection 1 mg  1 mg Intramuscular Q6H PRN Augusto Gamble, MD       melatonin tablet 3 mg  3 mg Oral QHS PRN Augusto Gamble, MD   3 mg at 08/06/23 2146   thiamine (VITAMIN B1) tablet 100 mg  100 mg Oral Daily Augusto Gamble, MD   100 mg at 08/07/23 1308   And   multivitamin with minerals tablet 1 tablet  1 tablet Oral Daily Augusto Gamble, MD   1 tablet at 08/07/23 6578   naproxen (NAPROSYN) tablet 500 mg  500 mg Oral BID PRN Augusto Gamble, MD   500 mg at 08/04/23 2218   nicotine (NICODERM CQ - dosed in mg/24 hours) patch 21 mg  21 mg Transdermal Daily PRN Augusto Gamble, MD        nicotine polacrilex (NICORETTE) gum 2 mg  2 mg Oral PRN Augusto Gamble, MD       ondansetron [facility]) tablet 8 mg  8 mg Oral Q8H PRN Augusto Gamble, MD   8 mg at 08/04/23 2218   polyethylene glycol (MIRALAX / GLYCOLAX) packet 17 g  17 g Oral Daily PRN Augusto Gamble, MD       senna Mancel Parsons) tablet 8.6 mg  1 tablet Oral QHS PRN Augusto Gamble, MD       Current Outpatient Medications  Medication Sig Dispense Refill   melatonin 3 MG TABS tablet Take 1 tablet (3 mg total) by mouth at bedtime as needed.     naloxone (NARCAN) nasal spray 4 mg/0.1 mL Place 1 spray into the nose once for 1 dose. 1 spray into one nostril, then another spray may be given into the other nostril every 2 to 3 minutes until the recipient responds 1 each 0   nicotine (NICODERM CQ - DOSED IN MG/24 HOURS) 21 mg/24hr patch Place 1 patch (21 mg total) onto the skin daily as needed (nicotine cravings). 28 patch 0   nicotine polacrilex (NICORETTE) 2 MG gum Take 1 each (2 mg total) by mouth as needed for smoking cessation.      PTA Medications: (Not in a hospital admission)      06/05/2023   10:14 AM  Depression screen PHQ 2/9  Decreased Interest 0  Down, Depressed, Hopeless 0  PHQ - 2 Score 0    Flowsheet Row ED from 08/04/2023 in [facility] ED from 06/15/2023 in [facility] Department at [facility] ED from 06/09/2023 in [facility] Emergency Department at [facility]  C-SSRS RISK CATEGORY No Risk Error: Question 6 not populated Error: Q3, 4, or 5 should not be populated when Q2 is No       Musculoskeletal  Strength & Muscle Tone: within normal limits Gait & Station: normal Patient leans: N/A  Psychiatric Specialty Exam  Presentation General Appearance: Disheveled   Eye Contact:Good   Speech:Clear and Coherent; Normal Rate   Speech Volume:Normal   Handedness:Right   Mood and Affect  Mood:-- ("fine")   Affect:Appropriate; Congruent; Full Range   Thought  Process  Thought Processes:Coherent; Linear; Goal Directed   Descriptions of Associations:Intact   Orientation:Full (Time, Place and Person)   Thought Content:Logical; WDL   Diagnosis of Schizophrenia or Schizoaffective disorder in past: Yes    Hallucinations:Hallucinations: None   Ideas of Reference:None   Suicidal Thoughts:Suicidal Thoughts: No   Homicidal Thoughts:Homicidal Thoughts: No   Sensorium  Memory:Immediate Good; Recent Good; Remote Good   Judgment:Good   Insight:Poor   Executive  Functions  Concentration:Good   Attention Span:Good   Recall:Good   Fund of Knowledge:Good   Language:Good   Psychomotor Activity  Psychomotor Activity:Psychomotor Activity: Normal   Assets  Assets:Resilience   Sleep  Sleep:Sleep: Good Number of Hours of Sleep: 8   No data recorded  Physical Exam  Physical Exam Vitals and nursing note reviewed.  HENT:     Head: Normocephalic and atraumatic.  Pulmonary:     Effort: Pulmonary effort is normal.  Musculoskeletal:     Cervical back: Normal range of motion.  Neurological:     General: No focal deficit present.     Mental Status: He is alert.    Review of Systems  Constitutional: Negative.   Respiratory: Negative.    Cardiovascular: Negative.   Gastrointestinal: Negative.   Genitourinary: Negative.   Psychiatric/Behavioral:         Psychiatric subjective data addressed in PSE or HPI / daily subjective report   Blood pressure 125/77, pulse 66, temperature 98.1 F (36.7 C), resp. rate 18, SpO2 100%. There is no height or weight on file to calculate BMI.  Demographic Factors:  Male, Low socioeconomic status, and Unemployed  Loss Factors: NA  Historical Factors: NA  Risk Reduction Factors:   NA  Continued Clinical Symptoms:  More than one psychiatric diagnosis  Cognitive Features That Contribute To Risk:  Closed-mindedness    Suicide Risk:  Mild:  Suicidal ideation of limited  frequency, intensity, duration, and specificity.  There are no identifiable plans, no associated intent, mild dysphoria and related symptoms, good self-control (both objective and subjective assessment), few other risk factors, and identifiable protective factors, including available and accessible social support.  Plan Of Care/Follow-up recommendations:  Activity: as tolerated  Diet: heart healthy  Other: -Follow-up with your outpatient psychiatric provider -instructions on appointment date, time, and address (location) are provided to you in discharge paperwork.  -Take your psychiatric medications as prescribed at discharge - instructions are provided to you in the discharge paperwork  -Follow-up with outpatient primary care doctor and other specialists -for management of chronic medical disease, including: health maintenance checks  -Testing: Follow-up with outpatient provider for abnormal lab results: elevated LFTs, reactive HCV  -Recommend abstinence from alcohol, tobacco, and other illicit drug use at discharge.   -If your psychiatric symptoms recur, worsen, or if you have side effects to your psychiatric medications, call your outpatient psychiatric provider, 911, [phone] or go to the nearest emergency department.  -If suicidal thoughts recur, call your outpatient psychiatric provider, 911, [phone] or go to the nearest emergency department.  Disposition: home / self-care  Augusto Gamble, MD 08/07/2023, 10:21 AM

## 2023-08-07 NOTE — Progress Notes (Signed)
Pt is asleep. Respirations are even and unlabored. No signs of acute distress noted. Staff will monitor for pt's safety. 

## 2023-08-07 NOTE — Discharge Instructions (Addendum)
Dear Mark Hammond,  It was a pleasure to take care of you during your stay at [facility]) where you were treated for your substance use disorders.  While you were here, you were:  observed and cared for by our nurses and nursing assistants  treated with medications by your psychiatrists  evaluated with imaging / lab tests, and treated with medicines / procedures by your doctors  provided individual and group therapy by therapists  provided resources by our social workers and case managers  Please review the medication list provided to you at discharge and stop, start taking, or continue taking the medications listed there.  You should also follow-up with your primary care doctor, or start seeing one if you don't have one yet. If applicable, here are some scheduled follow-ups for you:    I recommend abstinence from alcohol, tobacco, and other illicit drug use.   If your psychiatric symptoms or suicidal thoughts recur, worsen, or if you have side effects to your psychiatric medications, call your outpatient psychiatric provider, 911, [phone] or go to the nearest emergency department.  Take care!  Signed: Augusto Gamble, MD 08/07/2023, 10:23 AM  Naloxone (Narcan) can help reverse an overdose when given to the victim quickly.  [place] offers free naloxone kits and instructions/training on its use.  Add naloxone to your first aid kit and you can help save a life. A prescription can be filled at your local pharmacy or free kits are provided by the county.  Pick up your free kit at the following locations:   [place]:  [facility], [address] [place] [address] [phone]) [facility] [address] [place] [address] [phone]) [facility] [address] [place] [address]  [place]: [facility] [address] [place] [address] ([phone]) [facility] [address] [place] [address] [phone])

## 2023-08-07 NOTE — ED Notes (Signed)
Patient received breakfast. 

## 2023-08-07 NOTE — ED Notes (Signed)
Pt observed/assessed in recliner sleeping. RR even and unlabored, appearing in no noted distress. Environmental check complete, will continue to monitor for safety 

## 2023-08-10 ENCOUNTER — Other Ambulatory Visit: Payer: Self-pay

## 2023-08-10 ENCOUNTER — Emergency Department (HOSPITAL_COMMUNITY)
Admission: EM | Admit: 2023-08-10 | Discharge: 2023-08-14 | Disposition: A | Discharge status: Home / Self Care | Payer: 59 | Attending: Emergency Medicine | Admitting: Emergency Medicine

## 2023-08-10 DIAGNOSIS — R1011 Right upper quadrant pain: Secondary | ICD-10-CM | POA: Diagnosis not present

## 2023-08-10 DIAGNOSIS — R440 Auditory hallucinations: Secondary | ICD-10-CM | POA: Diagnosis not present

## 2023-08-10 DIAGNOSIS — B182 Chronic viral hepatitis C: Secondary | ICD-10-CM

## 2023-08-10 DIAGNOSIS — Y906 Blood alcohol level of 120-199 mg/100 ml: Secondary | ICD-10-CM | POA: Diagnosis not present

## 2023-08-10 DIAGNOSIS — R6889 Other general symptoms and signs: Secondary | ICD-10-CM | POA: Diagnosis not present

## 2023-08-10 DIAGNOSIS — F319 Bipolar disorder, unspecified: Secondary | ICD-10-CM | POA: Diagnosis not present

## 2023-08-10 DIAGNOSIS — K047 Periapical abscess without sinus: Secondary | ICD-10-CM | POA: Diagnosis not present

## 2023-08-10 DIAGNOSIS — F1994 Other psychoactive substance use, unspecified with psychoactive substance-induced mood disorder: Secondary | ICD-10-CM | POA: Diagnosis not present

## 2023-08-10 DIAGNOSIS — M79661 Pain in right lower leg: Secondary | ICD-10-CM | POA: Insufficient documentation

## 2023-08-10 DIAGNOSIS — F1090 Alcohol use, unspecified, uncomplicated: Secondary | ICD-10-CM | POA: Diagnosis present

## 2023-08-10 DIAGNOSIS — F1120 Opioid dependence, uncomplicated: Secondary | ICD-10-CM | POA: Diagnosis not present

## 2023-08-10 DIAGNOSIS — R109 Unspecified abdominal pain: Secondary | ICD-10-CM | POA: Diagnosis not present

## 2023-08-10 DIAGNOSIS — F1920 Other psychoactive substance dependence, uncomplicated: Secondary | ICD-10-CM | POA: Diagnosis present

## 2023-08-10 DIAGNOSIS — R110 Nausea: Secondary | ICD-10-CM | POA: Insufficient documentation

## 2023-08-10 DIAGNOSIS — F1914 Other psychoactive substance abuse with psychoactive substance-induced mood disorder: Secondary | ICD-10-CM | POA: Diagnosis not present

## 2023-08-10 DIAGNOSIS — F1020 Alcohol dependence, uncomplicated: Secondary | ICD-10-CM | POA: Insufficient documentation

## 2023-08-10 DIAGNOSIS — F1910 Other psychoactive substance abuse, uncomplicated: Secondary | ICD-10-CM | POA: Diagnosis not present

## 2023-08-10 DIAGNOSIS — R9431 Abnormal electrocardiogram [ECG] [EKG]: Secondary | ICD-10-CM | POA: Diagnosis not present

## 2023-08-10 DIAGNOSIS — F259 Schizoaffective disorder, unspecified: Secondary | ICD-10-CM | POA: Diagnosis not present

## 2023-08-10 DIAGNOSIS — M79604 Pain in right leg: Secondary | ICD-10-CM

## 2023-08-10 DIAGNOSIS — K760 Fatty (change of) liver, not elsewhere classified: Secondary | ICD-10-CM | POA: Diagnosis not present

## 2023-08-10 LAB — RAPID URINE DRUG SCREEN, HOSP PERFORMED
Amphetamines: POSITIVE — AB
Barbiturates: NOT DETECTED
Benzodiazepines: NOT DETECTED
Cocaine: POSITIVE — AB
Opiates: NOT DETECTED
Tetrahydrocannabinol: POSITIVE — AB

## 2023-08-10 LAB — SALICYLATE LEVEL: Salicylate Lvl: <7 mg/dL — ABNORMAL LOW (ref 7.0–30.0)

## 2023-08-10 LAB — COMPREHENSIVE METABOLIC PANEL
ALT: 264 U/L — ABNORMAL HIGH (ref 0–44)
AST: 466 U/L — ABNORMAL HIGH (ref 15–41)
Albumin: 3.8 g/dL (ref 3.5–5.0)
Alkaline Phosphatase: 112 U/L (ref 38–126)
Anion gap: 12 (ref 5–15)
BUN: 11 mg/dL (ref 6–20)
CO2: 25 mmol/L (ref 22–32)
Calcium: 8.8 mg/dL — ABNORMAL LOW (ref 8.9–10.3)
Chloride: 100 mmol/L (ref 98–111)
Creatinine, Ser: 0.71 mg/dL (ref 0.61–1.24)
GFR, Estimated: >60 mL/min (ref >60)
Glucose, Bld: 109 mg/dL — ABNORMAL HIGH (ref 70–99)
Potassium: 3.4 mmol/L — ABNORMAL LOW (ref 3.5–5.1)
Sodium: 137 mmol/L (ref 135–145)
Total Bilirubin: 1.2 mg/dL — ABNORMAL HIGH (ref <1.2)
Total Protein: 7.9 g/dL (ref 6.5–8.1)

## 2023-08-10 LAB — CBC
HCT: 39.4 % (ref 39.0–52.0)
Hemoglobin: 13.8 g/dL (ref 13.0–17.0)
MCH: 33.4 pg (ref 26.0–34.0)
MCHC: 35 g/dL (ref 30.0–36.0)
MCV: 95.4 fL (ref 80.0–100.0)
Platelets: 226 10*3/uL (ref 150–400)
RBC: 4.13 MIL/uL — ABNORMAL LOW (ref 4.22–5.81)
RDW: 13.1 % (ref 11.5–15.5)
WBC: 8 10*3/uL (ref 4.0–10.5)
nRBC: 0 % (ref 0.0–0.2)

## 2023-08-10 LAB — ACETAMINOPHEN LEVEL: Acetaminophen (Tylenol), Serum: <10 ug/mL — ABNORMAL LOW (ref 10–30)

## 2023-08-10 LAB — ETHANOL: Alcohol, Ethyl (B): 144 mg/dL — ABNORMAL HIGH (ref <10)

## 2023-08-10 LAB — CK: Total CK: 461 U/L — ABNORMAL HIGH (ref 49–397)

## 2023-08-10 MED ORDER — ONDANSETRON 4 MG PO TBDP
4.0000 mg | ORAL_TABLET | Freq: Once | ORAL | Status: AC
Start: 2023-08-10 — End: 2023-08-10
  Administered 2023-08-10: 4 mg via ORAL
  Filled 2023-08-10: qty 1

## 2023-08-10 MED ORDER — LORAZEPAM 1 MG PO TABS: 1.0000 mg | ORAL_TABLET | ORAL | Status: DC | PRN

## 2023-08-10 MED ORDER — IBUPROFEN 200 MG PO TABS
600.0000 mg | ORAL_TABLET | Freq: Once | ORAL | Status: AC
Start: 2023-08-10 — End: 2023-08-10
  Administered 2023-08-10: 600 mg via ORAL
  Filled 2023-08-10: qty 3

## 2023-08-10 MED ORDER — RISPERIDONE 0.5 MG PO TBDP: 2.0000 mg | ORAL_TABLET | Freq: Three times a day (TID) | ORAL | Status: DC | PRN

## 2023-08-10 MED ORDER — HYDROXYZINE HCL 25 MG PO TABS
25.0000 mg | ORAL_TABLET | Freq: Three times a day (TID) | ORAL | Status: DC | PRN
  Administered 2023-08-10: 25 mg via ORAL
  Filled 2023-08-10: qty 1

## 2023-08-10 MED ORDER — ZIPRASIDONE MESYLATE 20 MG IM SOLR: 20.0000 mg | INTRAMUSCULAR | Status: DC | PRN

## 2023-08-10 MED ORDER — OLANZAPINE 5 MG PO TABS
5.0000 mg | ORAL_TABLET | Freq: Every day | ORAL | Status: DC
  Administered 2023-08-10 – 2023-08-13 (×4): 5 mg via ORAL
  Filled 2023-08-10 (×4): qty 1

## 2023-08-10 MED ORDER — TRAZODONE HCL 100 MG PO TABS
150.0000 mg | ORAL_TABLET | Freq: Every day | ORAL | Status: DC
  Administered 2023-08-10 – 2023-08-13 (×4): 150 mg via ORAL
  Filled 2023-08-10 (×4): qty 1

## 2023-08-10 MED ORDER — OLANZAPINE 5 MG PO TBDP: 5.0000 mg | ORAL_TABLET | Freq: Three times a day (TID) | ORAL | Status: DC | PRN

## 2023-08-10 NOTE — Consult Note (Signed)
[facility] ED ASSESSMENT   Reason for Consult:  auditory hallucinations, self inflicted injuries  Referring Physician:  Dr. Jearld Fenton Patient Identification: Mark Hammond MRN:  [id number] ED Chief Complaint: Substance induced mood disorder (HCC)  Diagnosis:  Principal Problem:   Substance induced mood disorder (HCC) Active Problems:   Polysubstance (including opioids) dependence, daily use (HCC)   Alcohol use disorder   ED Assessment Time Calculation: Start Time: 1200 Stop Time: 1230 Total Time in Minutes (Assessment Completion): 30   Subjective: Mark Hammond is a 32 y.o. male patient who is experiencing homelessness with a past psychiatric history of schizoaffective disorder and x5 prior inpatient psychiatric hospitalizations and substance use history of alcohol use disorder, opioid use disorder, cannabis use disorder, stimulant use disorder, and tobacco use disorder who was admitted voluntarily.   HPI:  Mark Hammond, 32 y.o., male patient seen face to face by this provider, consulted with Dr. Lucianne Muss; and chart reviewed on 08/10/23.  On evaluation Mark Hammond reports he wants help with psychosis and substance use.  He says he "uses everything" and gets irritated when asked to discuss what he uses and how often. Patient BAL is 144, and his UDS is positive for methamphetamines, cocaine, and thc. On assessment patient is very irritable and states he does not take his medications because he did not have money to buy them and he's homeless, and he does not like the way they make him feel.  Patient states he just needs a place to stay and get clean.  He says he often has auditory and visual hallucinations, but he is not experiencing any at this moment. He denies suicidal or homicidal ideation.   During evaluation Mark Hammond is laying down in the hospital bed and appears to be in no acute distress.  He is alert, oriented x 4, irritable but cooperative.  His mood is irritable with congruent affect.  He has garbled  speech, and normal behavior.  Objectively there is no evidence of psychosis/mania or delusional thinking.  Patient is able to converse coherently, goal directed thoughts, no distractibility, or pre-occupation.  He denies suicidal/self-harm/homicidal ideation, psychosis, and paranoia.      Risk to Self or Others: Risk to Self:  Yes Risk to Others:  No  Prior Inpatient Therapy:  Yes  Prior Outpatient Therapy:  No   [place] Scale:  Flowsheet Row ED from 08/10/2023 in [facility] Emergency Department at [facility] ED from 08/04/2023 in [facility] ED from 06/15/2023 in [facility] Emergency Department at [facility]  C-SSRS RISK CATEGORY High Risk No Risk Error: Question 6 not populated       AIMS:  , , ,  ,   ASAM:    Substance Abuse:     Past Medical History:  Past Medical History:  Diagnosis Date   Alcoholic gastritis    Bipolar 1 disorder (HCC)    Dental abscess    Depression    Drug abuse (HCC)    ETOH abuse    Hallucination    Schizo affective schizophrenia (HCC)    No past surgical history on file. Family History:  Family History  Problem Relation Age of Onset   Stroke Other    Social History:  Social History   Substance and Sexual Activity  Alcohol Use Yes   Comment: 4-5x 40oz beers daily     Social History   Substance and Sexual Activity  Drug Use Yes   Frequency: 7.0 times per week  Types: "Crack" cocaine, Heroin, MDMA (Ecstacy), Marijuana, Cocaine   Comment: frequent stimulant use    Social History   Socioeconomic History   Marital status: Single    Spouse name: Not on file   Number of children: Not on file   Years of education: Not on file   Highest education level: Not on file  Occupational History   Not on file  Tobacco Use   Smoking status: Every Day    Current packs/day: 1.00    Types: Cigarettes   Smokeless tobacco: Never  Vaping Use   Vaping status: Every Day   Substances: THC   Substance and Sexual Activity   Alcohol use: Yes    Comment: 4-5x 40oz beers daily   Drug use: Yes    Frequency: 7.0 times per week    Types: "Crack" cocaine, Heroin, MDMA (Ecstacy), Marijuana, Cocaine    Comment: frequent stimulant use   Sexual activity: Yes  Other Topics Concern   Not on file  Social History Narrative   Pt lives in [place] with ex-girlfriend.  Pt is not followed by an outpatient psychiatric provider.   Social Determinants of Health   Financial Resource Strain: Not on file  Food Insecurity: Food Insecurity Present (02/23/2023)   Hunger Vital Sign    Worried About Running Out of Food in the Last Year: Often true    Ran Out of Food in the Last Year: Sometimes true  Transportation Needs: Unmet Transportation Needs (02/23/2023)   PRAPARE - Administrator, Civil Service (Medical): Yes    Lack of Transportation (Non-Medical): Yes  Physical Activity: Not on file  Stress: Not on file  Social Connections: Not on file      Allergies:   Allergies  Allergen Reactions   Codeine Other (See Comments)    Patient states parents told him he was allergic at a young age.    Labs:  Results for orders placed or performed during the hospital encounter of 08/10/23 (from the past 48 hour(s))  Comprehensive metabolic panel     Status: Abnormal   Collection Time: 08/10/23  5:55 AM  Result Value Ref Range   Sodium 137 135 - 145 mmol/L   Potassium 3.4 (L) 3.5 - 5.1 mmol/L   Chloride 100 98 - 111 mmol/L   CO2 25 22 - 32 mmol/L   Glucose, Bld 109 (H) 70 - 99 mg/dL    Comment: Glucose reference range applies only to samples taken after fasting for at least 8 hours.   BUN 11 6 - 20 mg/dL   Creatinine, Ser 0.45 0.61 - 1.24 mg/dL   Calcium 8.8 (L) 8.9 - 10.3 mg/dL   Total Protein 7.9 6.5 - 8.1 g/dL   Albumin 3.8 3.5 - 5.0 g/dL   AST 409 (H) 15 - 41 U/L   ALT 264 (H) 0 - 44 U/L   Alkaline Phosphatase 112 38 - 126 U/L   Total Bilirubin 1.2 (H) <1.2 mg/dL   GFR,  Estimated >81 >19 mL/min    Comment: (NOTE) Calculated using the CKD-EPI Creatinine Equation (2021)    Anion gap 12 5 - 15    Comment: Performed at [facility], [address], [place], [place] [address]  Ethanol     Status: Abnormal   Collection Time: 08/10/23  5:55 AM  Result Value Ref Range   Alcohol, Ethyl (B) 144 (H) <10 mg/dL    Comment: (NOTE) Lowest detectable limit for serum alcohol is 10 mg/dL.  For medical purposes only. Performed at [facility], [address] [place], [place] [address]   Salicylate level     Status: Abnormal   Collection Time: 08/10/23  5:55 AM  Result Value Ref Range   Salicylate Lvl <7.0 (L) 7.0 - 30.0 mg/dL    Comment: Performed at [facility], [address] [place], [place] [address]  Acetaminophen level     Status: Abnormal   Collection Time: 08/10/23  5:55 AM  Result Value Ref Range   Acetaminophen (Tylenol), Serum <10 (L) 10 - 30 ug/mL    Comment: (NOTE) Therapeutic concentrations vary significantly. A range of 10-30 ug/mL  may be an effective concentration for many patients. However, some  are best treated at concentrations outside of this range. Acetaminophen concentrations >150 ug/mL at 4 hours after ingestion  and >50 ug/mL at 12 hours after ingestion are often associated with  toxic reactions.  Performed at [facility], [address], [place], [place] [address]   cbc     Status: Abnormal   Collection Time: 08/10/23  5:55 AM  Result Value Ref Range   WBC 8.0 4.0 - 10.5 K/uL   RBC 4.13 (L) 4.22 - 5.81 MIL/uL   Hemoglobin 13.8 13.0 - 17.0 g/dL   HCT 91.4 78.2 - 95.6 %   MCV 95.4 80.0 - 100.0 fL   MCH 33.4 26.0 - 34.0 pg   MCHC 35.0 30.0 - 36.0 g/dL   RDW 21.3 08.6 - 57.8 %   Platelets 226 150 - 400 K/uL   nRBC 0.0 0.0 - 0.2 %    Comment: Performed at [facility], [address], [place], [place] [address]  CK     Status:  Abnormal   Collection Time: 08/10/23  5:55 AM  Result Value Ref Range   Total CK 461 (H) 49 - 397 U/L    Comment: Performed at [facility], [address] [place], [place] [address]  Rapid urine drug screen (hospital performed)     Status: Abnormal   Collection Time: 08/10/23  7:39 AM  Result Value Ref Range   Opiates NONE DETECTED NONE DETECTED   Cocaine POSITIVE (A) NONE DETECTED   Benzodiazepines NONE DETECTED NONE DETECTED   Amphetamines POSITIVE (A) NONE DETECTED   Tetrahydrocannabinol POSITIVE (A) NONE DETECTED   Barbiturates NONE DETECTED NONE DETECTED    Comment: (NOTE) DRUG SCREEN FOR MEDICAL PURPOSES ONLY.  IF CONFIRMATION IS NEEDED FOR ANY PURPOSE, NOTIFY LAB WITHIN 5 DAYS.  LOWEST DETECTABLE LIMITS FOR URINE DRUG SCREEN Drug Class                     Cutoff (ng/mL) Amphetamine and metabolites    1000 Barbiturate and metabolites    200 Benzodiazepine                 200 Opiates and metabolites        300 Cocaine and metabolites        300 THC                            50 Performed at [facility], [address], [place], [place] [address]     Current Facility-Administered Medications  Medication Dose Route Frequency Provider Last Rate Last Admin   OLANZapine zydis (ZYPREXA) disintegrating tablet 5 mg  5 mg Oral Q8H PRN Motley-Mangrum, Ezra Sites, PMHNP  And   LORazepam (ATIVAN) tablet 1 mg  1 mg Oral PRN Motley-Mangrum, Shanera Meske A, PMHNP       And   ziprasidone (GEODON) injection 20 mg  20 mg Intramuscular PRN Motley-Mangrum, Louvenia Golomb A, PMHNP       Current Outpatient Medications  Medication Sig Dispense Refill   ADVIL 200 MG CAPS Take 200-400 mg by mouth every 6 (six) hours as needed (for pain or headaches).     melatonin 3 MG TABS tablet Take 1 tablet (3 mg total) by mouth at bedtime as needed. (Patient taking differently: Take 3 mg by mouth at bedtime as needed (for sleep).)     naloxone (NARCAN) nasal spray 4 mg/0.1  mL Place 1 spray into the nose once as needed (for an opioid crisis).     nicotine (NICODERM CQ - DOSED IN MG/24 HOURS) 21 mg/24hr patch Place 1 patch (21 mg total) onto the skin daily as needed (nicotine cravings). 28 patch 0   nicotine polacrilex (NICORETTE) 2 MG gum Take 1 each (2 mg total) by mouth as needed for smoking cessation.      Musculoskeletal:  Observed patient resting in bed    Psychiatric Specialty Exam: Presentation  General Appearance:  Disheveled  Eye Contact: Fleeting  Speech: Garbled; Slow  Speech Volume: Decreased  Handedness: Right   Mood and Affect  Mood: Irritable  Affect: Depressed; Flat   Thought Process  Thought Processes: Disorganized  Descriptions of Associations:Loose  Orientation:Partial  Thought Content:Scattered  History of Schizophrenia/Schizoaffective disorder:Yes  Duration of Psychotic Symptoms:Greater than six months  Hallucinations:Hallucinations: None  Ideas of Reference:None  Suicidal Thoughts:Suicidal Thoughts: No  Homicidal Thoughts:Homicidal Thoughts: No   Sensorium  Memory: Immediate Fair; Recent Fair  Judgment: Impaired  Insight: Poor   Executive Functions  Concentration: Poor  Attention Span: Poor  Recall: Poor  Fund of Knowledge: Poor  Language: Poor   Psychomotor Activity  Psychomotor Activity: Psychomotor Activity: Normal   Assets  Assets: Manufacturing systems engineer; Desire for Improvement; Social Support    Sleep  Sleep: Sleep: Fair   Physical Exam: Physical Exam Vitals and nursing note reviewed. Exam conducted with a chaperone present.  Neurological:     Mental Status: He is alert.  Psychiatric:        Attention and Perception: Attention normal.        Mood and Affect: Mood is anxious. Affect is labile.        Speech: Speech is slurred.        Behavior: Behavior is agitated.        Thought Content: Thought content normal.        Cognition and Memory: Cognition  is impaired.        Judgment: Judgment is impulsive and inappropriate.     Comments: Speech: garbled     Review of Systems  Constitutional: Negative.   Psychiatric/Behavioral:  Positive for substance abuse.    Blood pressure 113/61, pulse 86, temperature 98.7 F (37.1 C), temperature source Oral, resp. rate 15, height 5\' 4"  (1.626 m), weight 59 kg, SpO2 95%. Body mass index is 22.31 kg/m.   Medical Decision Making: Patient case review and discussed with Dr. Lucianne Muss. Patient needs inpatient psychiatric admission for stabilization and treatment.    Disposition: Recommend psychiatric Inpatient admission.  Alona Bene, PMHNP 08/10/2023 5:38 PM

## 2023-08-10 NOTE — ED Notes (Signed)
Pt ox sats dropped, adjusted the sensor and ox rebounded to 96%

## 2023-08-10 NOTE — ED Triage Notes (Signed)
Pt reports lower leg pain x1 day, has self inflicted cuts to right lower abdomen done yesterday. Last etoh 2 hrs pta, crack/cocaine 3hrs pta. Endorses meth, fentanyl use as well since dc. Does not take his psych meds. Vomiting, says it starts when he tries to eat.

## 2023-08-10 NOTE — Discharge Instructions (Addendum)
Please follow-up with [facility] gastroenterology about your hepatitis

## 2023-08-10 NOTE — ED Provider Notes (Signed)
[place] EMERGENCY DEPARTMENT AT [facility] Provider Note   CSN: [id number] Arrival date & time: 08/10/23  2536     History  Chief Complaint  Patient presents with   Leg Pain   Suicidal    Mark Hammond is a 32 y.o. male with PMH as listed below who [facility]inations/psychosis. He states he is hearing voices; they are not commanding in nature. Released from [facility] 4 days ago. Patient reports right lower leg pain x1 day, localized to anterior right lower leg. Also has self inflicted cuts to right lower abdomen done yesterday. Currently he denies SI/HI/VH. Last EtOH 2 hrs pta, crack cocaine 3hrs pta. Endorses meth, fentanyl use as well since dc. States he hasn't slept in >72 hours. Does not take his psych meds. Also endorses mild nausea. Patient is calm, cooperative, A&Ox4 on my assessment.   Past Medical History:  Diagnosis Date   Alcoholic gastritis    Bipolar 1 disorder (HCC)    Dental abscess    Depression    Drug abuse (HCC)    ETOH abuse    Hallucination    Schizo affective schizophrenia (HCC)        Home Medications Prior to Admission medications   Medication Sig Start Date End Date Taking? Authorizing Provider  melatonin 3 MG TABS tablet Take 1 tablet (3 mg total) by mouth at bedtime as needed. 08/07/23   Augusto Gamble, MD  nicotine (NICODERM CQ - DOSED IN MG/24 HOURS) 21 mg/24hr patch Place 1 patch (21 mg total) onto the skin daily as needed (nicotine cravings). 08/07/23   Augusto Gamble, MD  nicotine polacrilex (NICORETTE) 2 MG gum Take 1 each (2 mg total) by mouth as needed for smoking cessation. 08/07/23   Augusto Gamble, MD      Allergies    Codeine    Review of Systems   Review of Systems A 10 point review of systems was performed and is negative unless otherwise reported in HPI.  Physical Exam Updated Vital Signs BP 113/61   Pulse 86   Temp 98.7 F (37.1 C) (Oral)   Resp 15   Ht 5\' 4"  (1.626 m)   Wt 59 kg    SpO2 95%   BMI 22.31 kg/m  Physical Exam General: Normal appearing male, lying in bed.  HEENT: PERRLA, Sclera anicteric, MMM, trachea midline.  Cardiology: RRR, no murmurs/rubs/gallops. BL radial and DP pulses equal bilaterally.  Resp: Normal respiratory rate and effort. CTAB, no wheezes, rhonchi, crackles.  Abd: Soft, non-tender, non-distended. No rebound tenderness or guarding.  GU: Deferred. MSK: No peripheral edema or signs of trauma. Mild TTP to anterior right lower leg without any gross abnormalities. Intact ROM of R knee/ankle. Extremities without deformity or TTP. No cyanosis or clubbing. Skin: warm, dry. Neuro: A&Ox4, CNs II-XII grossly intact. MAEs. Sensation grossly intact.  Psych: Normal mood and affect.   ED Results / Procedures / Treatments   Labs (all labs ordered are listed, but only abnormal results are displayed) Labs Reviewed  COMPREHENSIVE METABOLIC PANEL - Abnormal; Notable for the following components:      Result Value   Potassium 3.4 (*)    Glucose, Bld 109 (*)    Calcium 8.8 (*)    AST 466 (*)    ALT 264 (*)    Total Bilirubin 1.2 (*)    All other components within normal limits  ETHANOL - Abnormal; Notable for the following components:   Alcohol, Ethyl (  B) 144 (*)    All other components within normal limits  SALICYLATE LEVEL - Abnormal; Notable for the following components:   Salicylate Lvl <7.0 (*)    All other components within normal limits  ACETAMINOPHEN LEVEL - Abnormal; Notable for the following components:   Acetaminophen (Tylenol), Serum <10 (*)    All other components within normal limits  CBC - Abnormal; Notable for the following components:   RBC 4.13 (*)    All other components within normal limits  RAPID URINE DRUG SCREEN, HOSP PERFORMED - Abnormal; Notable for the following components:   Cocaine POSITIVE (*)    Amphetamines POSITIVE (*)    Tetrahydrocannabinol POSITIVE (*)    All other components within normal limits  CK -  Abnormal; Notable for the following components:   Total CK 461 (*)    All other components within normal limits    EKG EKG Interpretation Date/Time:  Thursday August 10 2023 08:06:02 EST Ventricular Rate:  92 PR Interval:  129 QRS Duration:  86 QT Interval:  373 QTC Calculation: 462 R Axis:   65  Text Interpretation: Sinus rhythm RSR' in V1 or V2, probably normal variant Confirmed by Vivi Barrack [phone]) on 08/10/2023 8:20:07 AM  Radiology No results found.  Procedures Procedures    Medications Ordered in ED Medications  ibuprofen (ADVIL) tablet 600 mg (600 mg Oral Given 08/10/23 0919)  ondansetron (ZOFRAN-ODT) disintegrating tablet 4 mg (4 mg Oral Given 08/10/23 0919)    ED Course/ Medical Decision Making/ A&P                          Medical Decision Making Amount and/or Complexity of Data Reviewed Labs: ordered. Decision-making details documented in ED Course.  Risk OTC drugs. Prescription drug management.    This patient presents to the ED for concern of auditory hallucinations, c/f psychosis, this involves an extensive number of treatment options, and is a complaint that carries with it a high risk of complications and morbidity.  I considered the following differential and admission for this acute, potentially life threatening condition.   MDM:    Patient denies SI/HI at this time. Denies plan to harm himself. He is A&Ox4, not actively psychotic, and he is here voluntarily, do not believe he needs to be involuntarily committed at this time. He does report drug/alcohol use but is not clinically intoxicated at this time. He states he has not taken his psychiatric medications. He does not appear to be grossly  manic though he reports he hasn't slept, likely d/t stimulant use. He does report right lower leg pain, which he has reported in the past. No calf tenderness, swelling, or erythema to indicate DVT. He is well's criteria negative for DVT, no need for [place]. No  gross abnormalities, no problem with ambulation, very low c/f fracture/dislocation, believe XR would be low yield, and he had DG right tib/fib on 06/15/23 which was unremarkable with no focal bone lesions. Likely benign musculoskeletal or shin splints given location of pain. His labs do demonstrate worsening transaminitis, though he does have baseline transaminitis, and he has no RUQ TTP or fever to indicate cholecystitis/cholangitis. Does not report any tylenol intake, and tylenol level is negative. Likely this is due to alcohol intake.   Clinical Course as of 08/10/23 1210  Thu Aug 10, 2023  0706 ALT(!): 264 [HN]  0706 AST(!): 466 +transaminitis mildly worse than his baseline, in EtOH pattern.  [HN]  0706 EtOH  144, acetaminophen/salicylate negative [HN]  0819 Rapid urine drug screen (hospital performed)(!) +cocaine, meth, THC [HN]  1042 CK Total(!): 461 Not sufficiently elevated to represent rhabdomyolysis. [HN]  1042 Patient is medically cleared pending psych [HN]    Clinical Course User Index [HN] Loetta Rough, MD    Labs: I Ordered, and personally interpreted labs.  The pertinent results include:  those listed above   Additional history obtained from chart review.    Cardiac Monitoring: The patient was maintained on a cardiac monitor.  I personally viewed and interpreted the cardiac monitored which showed an underlying rhythm of: NSR  Social Determinants of Health: Lives independently  Disposition:  [facility]  Co morbidities that complicate the patient evaluation  Past Medical History:  Diagnosis Date   Alcoholic gastritis    Bipolar 1 disorder (HCC)    Dental abscess    Depression    Drug abuse (HCC)    ETOH abuse    Hallucination    Schizo affective schizophrenia (HCC)      Medicines Meds ordered this encounter  Medications   ibuprofen (ADVIL) tablet 600 mg   ondansetron (ZOFRAN-ODT) disintegrating tablet 4 mg    I have reviewed the patients home medicines and  have made adjustments as needed  Problem List / ED Course: Problem List Items Addressed This Visit   None Visit Diagnoses     Polysubstance abuse (HCC)    -  Primary   Right leg pain       Auditory hallucinations                       This note was created using dictation software, which may contain spelling or grammatical errors.    Loetta Rough, MD 08/10/23 [phone]

## 2023-08-10 NOTE — ED Triage Notes (Signed)
[place] [facility] [place]osis. Released from [facility] 4 days ago. Admits nto narcotic and etoh abuse. Also c/o right leg pain. Ambulatory in triage. VSS  130/70 Cbg 138 Hr 100

## 2023-08-11 ENCOUNTER — Inpatient Hospital Stay (HOSPITAL_COMMUNITY): Admission: EM | Admit: 2023-08-11 | Payer: 59 | Source: Intra-hospital | Admitting: Psychiatry

## 2023-08-11 ENCOUNTER — Emergency Department (HOSPITAL_COMMUNITY): Payer: 59

## 2023-08-11 DIAGNOSIS — K760 Fatty (change of) liver, not elsewhere classified: Secondary | ICD-10-CM | POA: Diagnosis not present

## 2023-08-11 DIAGNOSIS — R109 Unspecified abdominal pain: Secondary | ICD-10-CM | POA: Diagnosis not present

## 2023-08-11 LAB — URINE DRUGS OF ABUSE SCREEN W ALC, ROUTINE (REF LAB)
Amphetamines, Urine: NEGATIVE ng/mL
Barbiturate, Ur: NEGATIVE ng/mL
Benzodiazepine Quant, Ur: NEGATIVE ng/mL
Cannabinoid Quant, Ur: NEGATIVE ng/mL
Methadone Screen, Urine: NEGATIVE ng/mL
Opiate Quant, Ur: NEGATIVE ng/mL
Phencyclidine, Ur: NEGATIVE ng/mL
Propoxyphene, Urine: NEGATIVE ng/mL

## 2023-08-11 LAB — HEPATITIS PANEL, ACUTE
HCV Ab: REACTIVE — AB
Hep A IgM: NONREACTIVE
Hep B C IgM: NONREACTIVE
Hepatitis B Surface Ag: NONREACTIVE

## 2023-08-11 LAB — COCAINE CONF, UR
Benzoylecgonine GC/MS Conf: 35080 ng/mL
Cocaine Metab Quant, Ur: POSITIVE — AB

## 2023-08-11 LAB — ETHANOL CONFIRM, URINE: Ethanol, Ur - Confirmation: 0.099 %

## 2023-08-11 NOTE — ED Notes (Signed)
Pt speaking with TTS concerning placement options.

## 2023-08-11 NOTE — ED Provider Notes (Addendum)
Emergency Medicine Observation Re-evaluation Note  Mark Hammond is a 32 y.o. male, seen on rounds today.  Pt initially presented to the ED for complaints of Leg Pain and Suicidal Currently, the patient is in his room without complaint.  Physical Exam  BP 117/67 (BP Location: Right Arm)   Pulse (!) 55   Temp 98.1 F (36.7 C) (Oral)   Resp 14   Ht 5\' 4"  (1.626 m)   Wt 59 kg   SpO2 100%   BMI 22.31 kg/m  Physical Exam   ED Course / MDM  EKG:EKG Interpretation Date/Time:  Thursday August 10 2023 08:06:02 EST Ventricular Rate:  92 PR Interval:  129 QRS Duration:  86 QT Interval:  373 QTC Calculation: 462 R Axis:   65  Text Interpretation: Sinus rhythm RSR' in V1 or V2, probably normal variant Confirmed by Vivi Barrack [phone]) on 08/10/2023 8:20:07 AM  I have reviewed the labs performed to date as well as medications administered while in observation.  Recent changes in the last 24 hours include psychiatric evaluation.  Plan  Current plan is for inpatient placement.  Reassessment-2:30 PM.  Patient with transaminitis.  Likely due to alcohol use.  Psychiatry team is requesting further investigation.  Will order hepatitis panel, right upper quadrant ultrasound.   Anders Simmonds T, DO 08/11/23 0911    Anders Simmonds T, DO 08/11/23 1441

## 2023-08-11 NOTE — Consult Note (Signed)
Pt was accepted to [facility] ffor today but since his primary concern was for detox and he denied suicidal thoughts, psychiatry team felt he'd benefit from transfer to the facility based crisis center.  Unfortunately, d/t to patient's elevated LFTs, [facility] provider Dr. Enedina Finner did not feel he was appropriate for transfer and deferred further evaluation to ED provider.  Dr. Anders Simmonds,  and Louis Matte, RN apprised of above.

## 2023-08-11 NOTE — Progress Notes (Signed)
Pt meets inpatient BH per Alona Bene, PMHNP. This CSW requested [facility] Antoinette Cillo, RN to review. 1st CSW to follow up.   Maryjean Ka, MSW, LCSWA 08/11/2023 2:19 AM

## 2023-08-12 ENCOUNTER — Encounter (HOSPITAL_COMMUNITY): Payer: Self-pay

## 2023-08-12 DIAGNOSIS — F19151 Other psychoactive substance abuse with psychoactive substance-induced psychotic disorder with hallucinations: Secondary | ICD-10-CM | POA: Diagnosis not present

## 2023-08-12 DIAGNOSIS — M79661 Pain in right lower leg: Secondary | ICD-10-CM | POA: Diagnosis not present

## 2023-08-12 DIAGNOSIS — B182 Chronic viral hepatitis C: Secondary | ICD-10-CM | POA: Diagnosis not present

## 2023-08-12 DIAGNOSIS — F1994 Other psychoactive substance use, unspecified with psychoactive substance-induced mood disorder: Secondary | ICD-10-CM | POA: Diagnosis not present

## 2023-08-12 LAB — HEPATIC FUNCTION PANEL
ALT: 247 U/L — ABNORMAL HIGH (ref 0–44)
AST: 381 U/L — ABNORMAL HIGH (ref 15–41)
Albumin: 3.2 g/dL — ABNORMAL LOW (ref 3.5–5.0)
Alkaline Phosphatase: 97 U/L (ref 38–126)
Bilirubin, Direct: 0.2 mg/dL (ref 0.0–0.2)
Indirect Bilirubin: 0.8 mg/dL (ref 0.3–0.9)
Total Bilirubin: 1 mg/dL (ref <1.2)
Total Protein: 7.4 g/dL (ref 6.5–8.1)

## 2023-08-12 LAB — CK: Total CK: 41 U/L — ABNORMAL LOW (ref 49–397)

## 2023-08-12 LAB — AMMONIA: Ammonia: 48 umol/L — ABNORMAL HIGH (ref 9–35)

## 2023-08-12 NOTE — Progress Notes (Signed)
[facility] Psych ED Progress Note  08/12/2023 12:42 PM Mark Hammond  MRN:  [id number]   Subjective:  Male 32 years old who appears older than stated age was brought in by Ambulance from a gas station for AH/Psychosis.  Patient was discharged from [facility] four days prior.  On arrival CK was elevated as well as LFT. Today both labs were repeated this morning, CK drastically dropped to 41 but the LFT did not show much improvement.  Patient endorses suicide ideation with possible use of Alcohol or drugs to hurt himself.  At this time patient feels hopeless and helpless.  He understands the dangers associated with his use of Alcohol in reference to his LFT results.  He remains a danger to self and states his homelessness makes it difficult to stop drinking and using drugs.  He plans to enter long time Alcohol rehabilitation once he is detox is completed.  We will continue to seek bed placement while we offer Ativan as needed for alcohol withdrawal symptoms.  Patient denies HI/AVH this morning. Principal Problem: Substance induced mood disorder (HCC) Diagnosis:  Principal Problem:   Substance induced mood disorder (HCC) Active Problems:   Polysubstance (including opioids) dependence, daily use (HCC)   Alcohol use disorder   ED Assessment Time Calculation: Start Time: 1200 Stop Time: 1221 Total Time in Minutes (Assessment Completion): 21   Past Psychiatric History: see initial Psychiatry evaluation note  [place] Scale:  Flowsheet Row ED from 08/10/2023 in [facility] Emergency Department at [facility] ED from 08/04/2023 in [facility] ED from 06/15/2023 in [facility] Emergency Department at [facility]  C-SSRS RISK CATEGORY High Risk No Risk Error: Question 6 not populated       Past Medical History:  Past Medical History:  Diagnosis Date   Alcoholic gastritis    Bipolar 1 disorder (HCC)    Dental abscess    Depression    Drug abuse (HCC)    ETOH abuse     Hallucination    Schizo affective schizophrenia (HCC)    No past surgical history on file. Family History:  Family History  Problem Relation Age of Onset   Stroke Other    Family Psychiatric  History: see initial Psychiatry evaluation note Social History:  Social History   Substance and Sexual Activity  Alcohol Use Yes   Comment: 4-5x 40oz beers daily     Social History   Substance and Sexual Activity  Drug Use Yes   Frequency: 7.0 times per week   Types: "Crack" cocaine, Heroin, MDMA (Ecstacy), Marijuana, Cocaine   Comment: frequent stimulant use    Social History   Socioeconomic History   Marital status: Single    Spouse name: Not on file   Number of children: Not on file   Years of education: Not on file   Highest education level: Not on file  Occupational History   Not on file  Tobacco Use   Smoking status: Every Day    Current packs/day: 1.00    Types: Cigarettes   Smokeless tobacco: Never  Vaping Use   Vaping status: Every Day   Substances: THC  Substance and Sexual Activity   Alcohol use: Yes    Comment: 4-5x 40oz beers daily   Drug use: Yes    Frequency: 7.0 times per week    Types: "Crack" cocaine, Heroin, MDMA (Ecstacy), Marijuana, Cocaine    Comment: frequent stimulant use   Sexual activity: Yes  Other Topics Concern  Not on file  Social History Narrative   Pt lives in [place] with ex-girlfriend.  Pt is not followed by an outpatient psychiatric provider.   Social Determinants of Health   Financial Resource Strain: Not on file  Food Insecurity: Food Insecurity Present (02/23/2023)   Hunger Vital Sign    Worried About Running Out of Food in the Last Year: Often true    Ran Out of Food in the Last Year: Sometimes true  Transportation Needs: Unmet Transportation Needs (02/23/2023)   PRAPARE - Administrator, Civil Service (Medical): Yes    Lack of Transportation (Non-Medical): Yes  Physical Activity: Not on file  Stress: Not  on file  Social Connections: Not on file    Sleep: Negative  Appetite:  Negative  Current Medications: Current Facility-Administered Medications  Medication Dose Route Frequency Provider Last Rate Last Admin   hydrOXYzine (ATARAX) tablet 25 mg  25 mg Oral TID PRN Motley-Mangrum, Jadeka A, PMHNP   25 mg at 08/10/23 2146   risperiDONE (RISPERDAL M-TABS) disintegrating tablet 2 mg  2 mg Oral Q8H PRN Motley-Mangrum, Jadeka A, PMHNP       And   LORazepam (ATIVAN) tablet 1 mg  1 mg Oral PRN Motley-Mangrum, Jadeka A, PMHNP       And   ziprasidone (GEODON) injection 20 mg  20 mg Intramuscular PRN Motley-Mangrum, Jadeka A, PMHNP       OLANZapine (ZYPREXA) tablet 5 mg  5 mg Oral QHS Motley-Mangrum, Jadeka A, PMHNP   5 mg at 08/11/23 2111   traZODone (DESYREL) tablet 150 mg  150 mg Oral QHS Motley-Mangrum, Jadeka A, PMHNP   150 mg at 08/11/23 2111   Current Outpatient Medications  Medication Sig Dispense Refill   ADVIL 200 MG CAPS Take 200-400 mg by mouth every 6 (six) hours as needed (for pain or headaches).     melatonin 3 MG TABS tablet Take 1 tablet (3 mg total) by mouth at bedtime as needed. (Patient taking differently: Take 3 mg by mouth at bedtime as needed (for sleep).)     naloxone (NARCAN) nasal spray 4 mg/0.1 mL Place 1 spray into the nose once as needed (for an opioid crisis).     nicotine (NICODERM CQ - DOSED IN MG/24 HOURS) 21 mg/24hr patch Place 1 patch (21 mg total) onto the skin daily as needed (nicotine cravings). 28 patch 0   nicotine polacrilex (NICORETTE) 2 MG gum Take 1 each (2 mg total) by mouth as needed for smoking cessation.      Lab Results:  Results for orders placed or performed during the hospital encounter of 08/10/23 (from the past 48 hour(s))  Hepatitis panel, acute     Status: Abnormal   Collection Time: 08/11/23  4:03 PM  Result Value Ref Range   Hepatitis B Surface Ag NON REACTIVE NON REACTIVE   HCV Ab Reactive (A) NON REACTIVE    Comment: (NOTE) The CDC  recommends that a Reactive HCV antibody result be followed up  with a HCV Nucleic Acid Amplification test.     Hep A IgM NON REACTIVE NON REACTIVE   Hep B C IgM NON REACTIVE NON REACTIVE    Comment: Performed at [facility], [address], [place], [place] [address]  Hepatic function panel     Status: Abnormal   Collection Time: 08/12/23 10:15 AM  Result Value Ref Range   Total Protein 7.4 6.5 - 8.1 g/dL   Albumin 3.2 (L) 3.5 -  5.0 g/dL   AST 161 (H) 15 - 41 U/L   ALT 247 (H) 0 - 44 U/L   Alkaline Phosphatase 97 38 - 126 U/L   Total Bilirubin 1.0 <1.2 mg/dL   Bilirubin, Direct 0.2 0.0 - 0.2 mg/dL   Indirect Bilirubin 0.8 0.3 - 0.9 mg/dL    Comment: Performed at [facility], [address], [place], [place] [address]  CK     Status: Abnormal   Collection Time: 08/12/23 10:15 AM  Result Value Ref Range   Total CK 41 (L) 49 - 397 U/L    Comment: Performed at [facility], [address] [place], [place] [address]    Blood Alcohol level:  Lab Results  Component Value Date   ETH 144 (H) 08/10/2023   ETH 288 (H) 06/15/2023    Physical Findings:  CIWA:    COWS:     Musculoskeletal: Strength & Muscle Tone: within normal limits Gait & Station: normal Patient leans: Front  Psychiatric Specialty Exam:  Presentation  General Appearance:  Casual  Eye Contact: Good  Speech: Clear and Coherent  Speech Volume: Normal  Handedness: Right   Mood and Affect  Mood: Depressed  Affect: Congruent; Depressed   Thought Process  Thought Processes: Coherent; Goal Directed  Descriptions of Associations:Intact  Orientation:Full (Time, Place and Person)  Thought Content:Logical  History of Schizophrenia/Schizoaffective disorder:Yes  Duration of Psychotic Symptoms:Greater than six months  Hallucinations:Hallucinations: None  Ideas of Reference:None  Suicidal Thoughts:Suicidal Thoughts: Yes, Active SI Active Intent  and/or Plan: Without Plan  Homicidal Thoughts:Homicidal Thoughts: No   Sensorium  Memory: Immediate Good; Recent Good; Remote Good  Judgment: Fair  Insight: Good   Executive Functions  Concentration: Good  Attention Span: Good  Recall: Good  Fund of Knowledge: Good  Language: Good   Psychomotor Activity  Psychomotor Activity: Psychomotor Activity: Normal   Assets  Assets: Communication Skills; Desire for Improvement   Sleep  Sleep: Sleep: Fair    Physical Exam: Physical Exam Vitals and nursing note reviewed.  HENT:     Nose: Nose normal.  Cardiovascular:     Rate and Rhythm: Normal rate and regular rhythm.  Pulmonary:     Effort: Pulmonary effort is normal.  Musculoskeletal:        General: Normal range of motion.  Skin:    General: Skin is dry.  Neurological:     Mental Status: He is alert and oriented to person, place, and time.  Psychiatric:        Attention and Perception: Attention and perception normal.        Mood and Affect: Mood is anxious and depressed.        Speech: Speech normal.        Behavior: Behavior normal. Behavior is cooperative.        Thought Content: Thought content includes suicidal ideation. Thought content includes suicidal plan.        Cognition and Memory: Cognition and memory normal.        Judgment: Judgment is impulsive and inappropriate.    Review of Systems  Constitutional:  Positive for weight loss.  HENT: Negative.    Eyes: Negative.   Respiratory: Negative.    Cardiovascular: Negative.   Gastrointestinal: Negative.   Genitourinary: Negative.   Musculoskeletal: Negative.   Skin: Negative.   Neurological: Negative.   Endo/Heme/Allergies: Negative.   Psychiatric/Behavioral:  Positive for substance abuse. The patient is nervous/anxious.    Blood pressure 114/63, pulse  70, temperature 97.9 F (36.6 C), resp. rate 16, height 5\' 4"  (1.626 m), weight 59 kg, SpO2 100%. Body mass index is 22.31  kg/m.   Medical Decision Making: Patient remains suicidal with possible plan to use Alcohol or  any drug in the street to kill himself.  He feels hopeless and helpless because he is unable to stop using Alcohol and illicit drugs.  We will continue to seek bed placement at any facility with available bed.  Patient states that as long as he is homeless he is not going to be able to stop using drugs and Alcohol.  He is scheduled Ativan 1 mg daily as needed for anxiety and agitation, Olanzapine 5 mg at bed time for mood and Psychosis and Hydroxyzine 25 mg tid as needed for anxiety.  Earney Navy, NP-PMHNP-BC 08/12/2023, 12:42 PM

## 2023-08-12 NOTE — ED Provider Notes (Signed)
Emergency Medicine Observation Re-evaluation Note  Mark Hammond is a 32 y.o. male, seen on rounds today.  Pt initially presented to the ED for complaints of Leg Pain and Suicidal Currently, the patient is not having any acute complaints.  Physical Exam  BP 113/66 (BP Location: Right Arm)   Pulse 82   Temp 97.9 F (36.6 C) (Oral)   Resp 17   Ht 5\' 4"  (1.626 m)   Wt 59 kg   SpO2 100%   BMI 22.31 kg/m  Physical Exam General: Resting in stretcher comfortably, alert and oriented Abdomen: Soft nontender, nondistended, negative Murphy sign, no hepatomegaly palpated Neuro: No asterixis or tremor Psych: Calm and cooperative  ED Course / MDM  EKG:EKG Interpretation Date/Time:  Thursday August 10 2023 08:06:02 EST Ventricular Rate:  92 PR Interval:  129 QRS Duration:  86 QT Interval:  373 QTC Calculation: 462 R Axis:   65  Text Interpretation: Sinus rhythm RSR' in V1 or V2, probably normal variant Confirmed by Vivi Barrack [phone]) on 08/10/2023 8:20:07 AM  I have reviewed the labs performed to date as well as medications administered while in observation.  Recent changes in the last 24 hours include noted to have elevated LFTs.  This was thought to be due to alcoholic hepatitis/transaminitis had a right upper quadrant ultrasound without acute findings.  Did have hepatitis panel that was hepatitis C positive-I did discuss this with the patient and he is aware and knows that he needs to follow-up with GI I stressed the importance of this.  Since he is here with a psychiatric complaint we will go ahead and send an ammonia but I feel this is normal that he is medically cleared and will just need to follow-up with GI as an outpatient.  Plan  Current plan to send ammonia.  If normal, patient is medically cleared.  Will need to follow-up with GI as an outpatient for his hepatitis Laurence Aly, MD 08/12/23 1722

## 2023-08-12 NOTE — ED Notes (Signed)
Pt was given a sandwich and a drink.

## 2023-08-13 DIAGNOSIS — B182 Chronic viral hepatitis C: Secondary | ICD-10-CM | POA: Diagnosis not present

## 2023-08-13 DIAGNOSIS — F19151 Other psychoactive substance abuse with psychoactive substance-induced psychotic disorder with hallucinations: Secondary | ICD-10-CM | POA: Diagnosis not present

## 2023-08-13 DIAGNOSIS — M79661 Pain in right lower leg: Secondary | ICD-10-CM | POA: Diagnosis not present

## 2023-08-13 NOTE — Progress Notes (Signed)
Patient has been denied by [facility] due to no appropriate beds available. Patient meets [facility] inpatient criteria per Dahlia Byes, NP. Patient has been faxed out to the following facilities:   [facility]  [address] [place] [place] [address] [phone]  [facility]  [address] [place] [address] [phone]  [facility] [address]  [place] [address] [phone]  [facility]  [address] [place] [address] [phone]  [facility]  [address], [place] [address] [phone]  [facility]  [address], [place] [address] [phone]  [facility] Patient Placement  [facility]: [address], [place] [phone]  [facility]  [address] [place] [address] [phone]  [facility]  [address], [place] [address] [phone]  [facility]  [address], [place] [address] [phone]  [facility]  [address] [place] [address] [phone]  [facility]  [address] [facility] [address] [place] [address] [phone]  [facility]  [address] [place], [place] [address] [phone]  [facility]  [address] [place], [place] [address] [phone]  [facility]  [address] [place] [address] [phone]  [facility]  [address] [place] [address] [phone]  [facility]  [address] [place] [address] [phone]   Damita Dunnings, MSW, LCSW-A  10:55 AM  08/13/2023

## 2023-08-13 NOTE — Consult Note (Signed)
Patient has remained calm and cooperative.  He has been compliant with Medication.  He has been accepted at [facility] for tomorrow and will be transferred as soon as transportation is available.

## 2023-08-13 NOTE — Progress Notes (Signed)
[facility] LCSW Progress Note   08/13/2023    11:16 AM  Mark Hammond   [id number]   Type of Contact and Topic:  Psychiatric Bed Placement   Pt accepted to [facility]    Patient meets inpatient criteria per Dahlia Byes, NP  The attending provider will be Dr. Sofie Hartigan   Call report to [phone]  Suzanna Obey, Paramedic @ [facility] ED notified.     Pt scheduled  to arrive at [facility] AFTER 0900.    Damita Dunnings, MSW, LCSW-A  11:17 AM 08/13/2023

## 2023-08-13 NOTE — ED Provider Notes (Signed)
Emergency Medicine Observation Re-evaluation Note  Llewellyn Orsino is a 32 y.o. male, seen on rounds today.  Pt initially presented to the ED for complaints of Leg Pain and Suicidal Currently, the patient is resting.  Physical Exam  BP 116/64 (BP Location: Right Arm)   Pulse 68   Temp 97.6 F (36.4 C) (Axillary)   Resp 18   Ht 5\' 4"  (1.626 m)   Wt 59 kg   SpO2 98%   BMI 22.31 kg/m  Physical Exam Neurological:     Mental Status: He is alert.      ED Course / MDM  EKG:EKG Interpretation Date/Time:  Thursday August 10 2023 08:06:02 EST Ventricular Rate:  92 PR Interval:  129 QRS Duration:  86 QT Interval:  373 QTC Calculation: 462 R Axis:   65  Text Interpretation: Sinus rhythm RSR' in V1 or V2, probably normal variant Confirmed by Vivi Barrack [phone]) on 08/10/2023 8:20:07 AM  I have reviewed the labs performed to date as well as medications administered while in observation.  Recent changes in the last 24 hours include nothing. Ammonina clincally insignificant, patient awake and alert and medically cleared, can pursue outpatient GI followup  Plan  Current plan is for psych plan.    Eliab, Magill, DO 08/13/23 0272

## 2023-08-14 DIAGNOSIS — F19151 Other psychoactive substance abuse with psychoactive substance-induced psychotic disorder with hallucinations: Secondary | ICD-10-CM | POA: Diagnosis not present

## 2023-08-14 DIAGNOSIS — M79661 Pain in right lower leg: Secondary | ICD-10-CM | POA: Diagnosis not present

## 2023-08-14 DIAGNOSIS — R4588 Nonsuicidal self-harm: Secondary | ICD-10-CM | POA: Diagnosis not present

## 2023-08-14 DIAGNOSIS — F250 Schizoaffective disorder, bipolar type: Secondary | ICD-10-CM | POA: Diagnosis not present

## 2023-08-14 DIAGNOSIS — F209 Schizophrenia, unspecified: Secondary | ICD-10-CM | POA: Diagnosis not present

## 2023-08-14 DIAGNOSIS — F17210 Nicotine dependence, cigarettes, uncomplicated: Secondary | ICD-10-CM | POA: Diagnosis not present

## 2023-08-14 DIAGNOSIS — F10151 Alcohol abuse with alcohol-induced psychotic disorder with hallucinations: Secondary | ICD-10-CM | POA: Diagnosis not present

## 2023-08-14 DIAGNOSIS — B182 Chronic viral hepatitis C: Secondary | ICD-10-CM | POA: Diagnosis not present

## 2023-08-14 DIAGNOSIS — Z652 Problems related to release from prison: Secondary | ICD-10-CM | POA: Diagnosis not present

## 2023-08-14 DIAGNOSIS — F15151 Other stimulant abuse with stimulant-induced psychotic disorder with hallucinations: Secondary | ICD-10-CM | POA: Diagnosis not present

## 2023-08-14 DIAGNOSIS — F12151 Cannabis abuse with psychotic disorder with hallucinations: Secondary | ICD-10-CM | POA: Diagnosis not present

## 2023-08-14 DIAGNOSIS — Z91148 Patient's other noncompliance with medication regimen for other reason: Secondary | ICD-10-CM | POA: Diagnosis not present

## 2023-08-14 DIAGNOSIS — X789XXD Intentional self-harm by unspecified sharp object, subsequent encounter: Secondary | ICD-10-CM | POA: Diagnosis not present

## 2023-08-14 DIAGNOSIS — Z5902 Unsheltered homelessness: Secondary | ICD-10-CM | POA: Diagnosis not present

## 2023-08-14 DIAGNOSIS — S31113D Laceration without foreign body of abdominal wall, right lower quadrant without penetration into peritoneal cavity, subsequent encounter: Secondary | ICD-10-CM | POA: Diagnosis not present

## 2023-08-14 DIAGNOSIS — Y906 Blood alcohol level of 120-199 mg/100 ml: Secondary | ICD-10-CM | POA: Diagnosis not present

## 2023-08-14 DIAGNOSIS — F14151 Cocaine abuse with cocaine-induced psychotic disorder with hallucinations: Secondary | ICD-10-CM | POA: Diagnosis not present

## 2023-08-14 DIAGNOSIS — F11150 Opioid abuse with opioid-induced psychotic disorder with delusions: Secondary | ICD-10-CM | POA: Diagnosis not present

## 2023-08-14 NOTE — ED Notes (Signed)
No behavior issues with pt this shift. Pt compliant with medications as ordered. Pt provided update that he will be transferred after 0900 today to [facility] for further inpatient treatment.

## 2023-08-14 NOTE — ED Provider Notes (Signed)
Emergency Medicine Observation Re-evaluation Note  Mark Hammond is a 32 y.o. male, seen on rounds today.  Pt initially presented to the ED for complaints of Leg Pain and Suicidal Currently, the patient is sleeping.  Physical Exam  BP 107/62   Pulse (!) 49   Temp (!) 97.5 F (36.4 C) (Oral)   Resp 18   Ht 5\' 4"  (1.626 m)   Wt 59 kg   SpO2 98%   BMI 22.31 kg/m  Physical Exam General: calm, sleeping Cardiac: well-perfused Lungs: no resp distress Psych: calm, sleeping  ED Course / MDM  EKG:EKG Interpretation Date/Time:  Thursday August 10 2023 08:06:02 EST Ventricular Rate:  92 PR Interval:  129 QRS Duration:  86 QT Interval:  373 QTC Calculation: 462 R Axis:   65  Text Interpretation: Sinus rhythm RSR' in V1 or V2, probably normal variant Confirmed by Vivi Barrack [phone]) on 08/10/2023 8:20:07 AM  I have reviewed the labs performed to date as well as medications administered while in observation.  Recent changes in the last 24 hours include accepted [facility].  Plan  Current plan is for inpatient [facility].    Loetta Rough, MD 08/14/23 [phone]

## 2023-08-15 DIAGNOSIS — F209 Schizophrenia, unspecified: Secondary | ICD-10-CM | POA: Diagnosis not present

## 2023-08-16 DIAGNOSIS — B182 Chronic viral hepatitis C: Secondary | ICD-10-CM | POA: Diagnosis not present

## 2023-08-16 DIAGNOSIS — S31113D Laceration without foreign body of abdominal wall, right lower quadrant without penetration into peritoneal cavity, subsequent encounter: Secondary | ICD-10-CM | POA: Diagnosis not present

## 2023-08-16 DIAGNOSIS — F250 Schizoaffective disorder, bipolar type: Secondary | ICD-10-CM | POA: Diagnosis not present

## 2023-08-16 DIAGNOSIS — F14151 Cocaine abuse with cocaine-induced psychotic disorder with hallucinations: Secondary | ICD-10-CM | POA: Diagnosis not present

## 2023-08-16 DIAGNOSIS — F11150 Opioid abuse with opioid-induced psychotic disorder with delusions: Secondary | ICD-10-CM | POA: Diagnosis not present

## 2023-08-16 DIAGNOSIS — R4588 Nonsuicidal self-harm: Secondary | ICD-10-CM | POA: Diagnosis not present

## 2023-08-16 DIAGNOSIS — F10151 Alcohol abuse with alcohol-induced psychotic disorder with hallucinations: Secondary | ICD-10-CM | POA: Diagnosis not present

## 2023-08-16 DIAGNOSIS — X789XXD Intentional self-harm by unspecified sharp object, subsequent encounter: Secondary | ICD-10-CM | POA: Diagnosis not present

## 2023-08-16 DIAGNOSIS — F209 Schizophrenia, unspecified: Secondary | ICD-10-CM | POA: Diagnosis not present

## 2023-08-16 DIAGNOSIS — F12151 Cannabis abuse with psychotic disorder with hallucinations: Secondary | ICD-10-CM | POA: Diagnosis not present

## 2023-08-16 DIAGNOSIS — F15151 Other stimulant abuse with stimulant-induced psychotic disorder with hallucinations: Secondary | ICD-10-CM | POA: Diagnosis not present

## 2023-08-16 DIAGNOSIS — F17210 Nicotine dependence, cigarettes, uncomplicated: Secondary | ICD-10-CM | POA: Diagnosis not present

## 2023-08-17 DIAGNOSIS — F10151 Alcohol abuse with alcohol-induced psychotic disorder with hallucinations: Secondary | ICD-10-CM | POA: Diagnosis not present

## 2023-08-17 DIAGNOSIS — X789XXD Intentional self-harm by unspecified sharp object, subsequent encounter: Secondary | ICD-10-CM | POA: Diagnosis not present

## 2023-08-17 DIAGNOSIS — F14151 Cocaine abuse with cocaine-induced psychotic disorder with hallucinations: Secondary | ICD-10-CM | POA: Diagnosis not present

## 2023-08-17 DIAGNOSIS — F15151 Other stimulant abuse with stimulant-induced psychotic disorder with hallucinations: Secondary | ICD-10-CM | POA: Diagnosis not present

## 2023-08-17 DIAGNOSIS — F11150 Opioid abuse with opioid-induced psychotic disorder with delusions: Secondary | ICD-10-CM | POA: Diagnosis not present

## 2023-08-17 DIAGNOSIS — F209 Schizophrenia, unspecified: Secondary | ICD-10-CM | POA: Diagnosis not present

## 2023-08-17 DIAGNOSIS — S31113D Laceration without foreign body of abdominal wall, right lower quadrant without penetration into peritoneal cavity, subsequent encounter: Secondary | ICD-10-CM | POA: Diagnosis not present

## 2023-08-17 DIAGNOSIS — F12151 Cannabis abuse with psychotic disorder with hallucinations: Secondary | ICD-10-CM | POA: Diagnosis not present

## 2023-08-17 DIAGNOSIS — F17210 Nicotine dependence, cigarettes, uncomplicated: Secondary | ICD-10-CM | POA: Diagnosis not present

## 2023-08-17 DIAGNOSIS — F250 Schizoaffective disorder, bipolar type: Secondary | ICD-10-CM | POA: Diagnosis not present

## 2023-08-17 DIAGNOSIS — R4588 Nonsuicidal self-harm: Secondary | ICD-10-CM | POA: Diagnosis not present

## 2023-08-17 DIAGNOSIS — B182 Chronic viral hepatitis C: Secondary | ICD-10-CM | POA: Diagnosis not present

## 2023-08-21 DIAGNOSIS — R442 Other hallucinations: Secondary | ICD-10-CM | POA: Diagnosis not present

## 2023-08-21 DIAGNOSIS — F29 Unspecified psychosis not due to a substance or known physiological condition: Secondary | ICD-10-CM | POA: Diagnosis not present

## 2023-08-21 DIAGNOSIS — Y907 Blood alcohol level of 200-239 mg/100 ml: Secondary | ICD-10-CM | POA: Diagnosis not present

## 2023-08-21 DIAGNOSIS — F10120 Alcohol abuse with intoxication, uncomplicated: Secondary | ICD-10-CM | POA: Diagnosis not present

## 2023-08-21 DIAGNOSIS — R45851 Suicidal ideations: Secondary | ICD-10-CM | POA: Diagnosis not present

## 2023-08-23 DIAGNOSIS — S5002XA Contusion of left elbow, initial encounter: Secondary | ICD-10-CM | POA: Diagnosis not present

## 2023-08-23 DIAGNOSIS — S50312A Abrasion of left elbow, initial encounter: Secondary | ICD-10-CM | POA: Diagnosis not present

## 2023-08-23 DIAGNOSIS — R441 Visual hallucinations: Secondary | ICD-10-CM | POA: Diagnosis not present

## 2023-08-23 DIAGNOSIS — F1210 Cannabis abuse, uncomplicated: Secondary | ICD-10-CM | POA: Diagnosis not present

## 2023-08-23 DIAGNOSIS — F1110 Opioid abuse, uncomplicated: Secondary | ICD-10-CM | POA: Diagnosis not present

## 2023-08-23 DIAGNOSIS — M25541 Pain in joints of right hand: Secondary | ICD-10-CM | POA: Diagnosis not present

## 2023-08-23 DIAGNOSIS — R45851 Suicidal ideations: Secondary | ICD-10-CM | POA: Diagnosis not present

## 2023-08-23 DIAGNOSIS — R440 Auditory hallucinations: Secondary | ICD-10-CM | POA: Diagnosis not present

## 2023-08-23 DIAGNOSIS — S6992XA Unspecified injury of left wrist, hand and finger(s), initial encounter: Secondary | ICD-10-CM | POA: Diagnosis not present

## 2023-08-23 DIAGNOSIS — F29 Unspecified psychosis not due to a substance or known physiological condition: Secondary | ICD-10-CM | POA: Diagnosis not present

## 2023-08-23 DIAGNOSIS — R442 Other hallucinations: Secondary | ICD-10-CM | POA: Diagnosis not present

## 2023-08-23 DIAGNOSIS — F1410 Cocaine abuse, uncomplicated: Secondary | ICD-10-CM | POA: Diagnosis not present

## 2023-09-07 DIAGNOSIS — T402X1A Poisoning by other opioids, accidental (unintentional), initial encounter: Secondary | ICD-10-CM | POA: Diagnosis not present

## 2023-09-07 DIAGNOSIS — R42 Dizziness and giddiness: Secondary | ICD-10-CM | POA: Diagnosis not present

## 2023-09-07 DIAGNOSIS — T50904A Poisoning by unspecified drugs, medicaments and biological substances, undetermined, initial encounter: Secondary | ICD-10-CM | POA: Diagnosis not present

## 2023-09-07 DIAGNOSIS — R000 Tachycardia, unspecified: Secondary | ICD-10-CM | POA: Diagnosis not present

## 2023-09-07 DIAGNOSIS — R092 Respiratory arrest: Secondary | ICD-10-CM | POA: Diagnosis not present

## 2023-09-07 DIAGNOSIS — I10 Essential (primary) hypertension: Secondary | ICD-10-CM | POA: Diagnosis not present

## 2023-09-07 DIAGNOSIS — Z743 Need for continuous supervision: Secondary | ICD-10-CM | POA: Diagnosis not present

## 2023-09-10 DIAGNOSIS — T50904A Poisoning by unspecified drugs, medicaments and biological substances, undetermined, initial encounter: Secondary | ICD-10-CM | POA: Diagnosis not present

## 2023-09-10 DIAGNOSIS — F1020 Alcohol dependence, uncomplicated: Secondary | ICD-10-CM | POA: Diagnosis not present

## 2023-09-10 DIAGNOSIS — F259 Schizoaffective disorder, unspecified: Secondary | ICD-10-CM | POA: Diagnosis not present

## 2023-09-10 DIAGNOSIS — R000 Tachycardia, unspecified: Secondary | ICD-10-CM | POA: Diagnosis not present

## 2023-09-10 DIAGNOSIS — R110 Nausea: Secondary | ICD-10-CM | POA: Diagnosis not present

## 2023-09-10 DIAGNOSIS — I10 Essential (primary) hypertension: Secondary | ICD-10-CM | POA: Diagnosis not present

## 2023-09-10 DIAGNOSIS — F1120 Opioid dependence, uncomplicated: Secondary | ICD-10-CM | POA: Diagnosis not present

## 2023-09-11 DIAGNOSIS — F1120 Opioid dependence, uncomplicated: Secondary | ICD-10-CM | POA: Diagnosis not present

## 2023-09-11 DIAGNOSIS — F259 Schizoaffective disorder, unspecified: Secondary | ICD-10-CM | POA: Diagnosis not present

## 2023-09-11 DIAGNOSIS — F1020 Alcohol dependence, uncomplicated: Secondary | ICD-10-CM | POA: Diagnosis not present

## 2023-09-12 DIAGNOSIS — F259 Schizoaffective disorder, unspecified: Secondary | ICD-10-CM | POA: Diagnosis not present

## 2023-09-12 DIAGNOSIS — F1120 Opioid dependence, uncomplicated: Secondary | ICD-10-CM | POA: Diagnosis not present

## 2023-09-12 DIAGNOSIS — F1020 Alcohol dependence, uncomplicated: Secondary | ICD-10-CM | POA: Diagnosis not present

## 2023-09-17 DIAGNOSIS — Z5941 Food insecurity: Secondary | ICD-10-CM | POA: Diagnosis not present

## 2023-09-17 DIAGNOSIS — K625 Hemorrhage of anus and rectum: Secondary | ICD-10-CM | POA: Diagnosis not present

## 2023-09-17 DIAGNOSIS — F1410 Cocaine abuse, uncomplicated: Secondary | ICD-10-CM | POA: Diagnosis not present

## 2023-09-17 DIAGNOSIS — R1013 Epigastric pain: Secondary | ICD-10-CM | POA: Diagnosis not present

## 2023-09-17 DIAGNOSIS — K6389 Other specified diseases of intestine: Secondary | ICD-10-CM | POA: Diagnosis not present

## 2023-09-17 DIAGNOSIS — Z79899 Other long term (current) drug therapy: Secondary | ICD-10-CM | POA: Diagnosis not present

## 2023-09-17 DIAGNOSIS — R591 Generalized enlarged lymph nodes: Secondary | ICD-10-CM | POA: Diagnosis not present

## 2023-09-17 DIAGNOSIS — R1012 Left upper quadrant pain: Secondary | ICD-10-CM | POA: Diagnosis not present

## 2023-09-17 DIAGNOSIS — F1290 Cannabis use, unspecified, uncomplicated: Secondary | ICD-10-CM | POA: Diagnosis not present

## 2023-09-17 DIAGNOSIS — R7401 Elevation of levels of liver transaminase levels: Secondary | ICD-10-CM | POA: Diagnosis not present

## 2023-09-17 DIAGNOSIS — E876 Hypokalemia: Secondary | ICD-10-CM | POA: Diagnosis not present

## 2023-09-17 DIAGNOSIS — K921 Melena: Secondary | ICD-10-CM | POA: Diagnosis not present

## 2023-09-17 DIAGNOSIS — Z59811 Housing instability, housed, with risk of homelessness: Secondary | ICD-10-CM | POA: Diagnosis not present

## 2023-09-17 DIAGNOSIS — F259 Schizoaffective disorder, unspecified: Secondary | ICD-10-CM | POA: Diagnosis not present

## 2023-09-17 DIAGNOSIS — Z8619 Personal history of other infectious and parasitic diseases: Secondary | ICD-10-CM | POA: Diagnosis not present

## 2023-09-17 DIAGNOSIS — F17210 Nicotine dependence, cigarettes, uncomplicated: Secondary | ICD-10-CM | POA: Diagnosis not present

## 2023-09-17 DIAGNOSIS — F17290 Nicotine dependence, other tobacco product, uncomplicated: Secondary | ICD-10-CM | POA: Diagnosis not present

## 2023-09-17 DIAGNOSIS — Z885 Allergy status to narcotic agent status: Secondary | ICD-10-CM | POA: Diagnosis not present

## 2023-09-30 DIAGNOSIS — Z743 Need for continuous supervision: Secondary | ICD-10-CM | POA: Diagnosis not present

## 2023-09-30 DIAGNOSIS — T40412A Poisoning by fentanyl or fentanyl analogs, intentional self-harm, initial encounter: Secondary | ICD-10-CM | POA: Diagnosis not present

## 2023-09-30 DIAGNOSIS — Z5900 Homelessness unspecified: Secondary | ICD-10-CM | POA: Diagnosis not present

## 2023-09-30 DIAGNOSIS — F10920 Alcohol use, unspecified with intoxication, uncomplicated: Secondary | ICD-10-CM | POA: Diagnosis not present

## 2023-09-30 DIAGNOSIS — T887XXA Unspecified adverse effect of drug or medicament, initial encounter: Secondary | ICD-10-CM | POA: Diagnosis not present

## 2023-09-30 DIAGNOSIS — T50904A Poisoning by unspecified drugs, medicaments and biological substances, undetermined, initial encounter: Secondary | ICD-10-CM | POA: Diagnosis not present

## 2023-09-30 DIAGNOSIS — R569 Unspecified convulsions: Secondary | ICD-10-CM | POA: Diagnosis not present

## 2023-09-30 DIAGNOSIS — R0689 Other abnormalities of breathing: Secondary | ICD-10-CM | POA: Diagnosis not present

## 2023-10-01 DIAGNOSIS — F410 Panic disorder [episodic paroxysmal anxiety] without agoraphobia: Secondary | ICD-10-CM | POA: Diagnosis not present

## 2023-10-01 DIAGNOSIS — F10139 Alcohol abuse with withdrawal, unspecified: Secondary | ICD-10-CM | POA: Diagnosis not present

## 2023-10-01 DIAGNOSIS — T40412A Poisoning by fentanyl or fentanyl analogs, intentional self-harm, initial encounter: Secondary | ICD-10-CM | POA: Diagnosis not present

## 2023-10-01 DIAGNOSIS — F10932 Alcohol use, unspecified with withdrawal with perceptual disturbance: Secondary | ICD-10-CM | POA: Diagnosis not present

## 2023-10-01 DIAGNOSIS — R7401 Elevation of levels of liver transaminase levels: Secondary | ICD-10-CM | POA: Diagnosis not present

## 2023-10-01 DIAGNOSIS — Z79899 Other long term (current) drug therapy: Secondary | ICD-10-CM | POA: Diagnosis not present

## 2023-10-01 DIAGNOSIS — F314 Bipolar disorder, current episode depressed, severe, without psychotic features: Secondary | ICD-10-CM | POA: Diagnosis not present

## 2023-10-01 DIAGNOSIS — F1110 Opioid abuse, uncomplicated: Secondary | ICD-10-CM | POA: Diagnosis not present

## 2023-10-01 DIAGNOSIS — F319 Bipolar disorder, unspecified: Secondary | ICD-10-CM | POA: Diagnosis not present

## 2023-10-01 DIAGNOSIS — F3130 Bipolar disorder, current episode depressed, mild or moderate severity, unspecified: Secondary | ICD-10-CM | POA: Diagnosis not present

## 2023-10-01 DIAGNOSIS — B182 Chronic viral hepatitis C: Secondary | ICD-10-CM | POA: Diagnosis not present

## 2023-10-01 DIAGNOSIS — Y905 Blood alcohol level of 100-119 mg/100 ml: Secondary | ICD-10-CM | POA: Diagnosis not present

## 2023-10-01 DIAGNOSIS — F1910 Other psychoactive substance abuse, uncomplicated: Secondary | ICD-10-CM | POA: Diagnosis not present

## 2023-10-01 DIAGNOSIS — F259 Schizoaffective disorder, unspecified: Secondary | ICD-10-CM | POA: Diagnosis not present

## 2023-10-01 DIAGNOSIS — F1193 Opioid use, unspecified with withdrawal: Secondary | ICD-10-CM | POA: Diagnosis not present

## 2023-10-01 DIAGNOSIS — F1020 Alcohol dependence, uncomplicated: Secondary | ICD-10-CM | POA: Diagnosis not present

## 2023-10-01 DIAGNOSIS — Z5900 Homelessness unspecified: Secondary | ICD-10-CM | POA: Diagnosis not present

## 2023-10-01 DIAGNOSIS — R748 Abnormal levels of other serum enzymes: Secondary | ICD-10-CM | POA: Diagnosis not present

## 2023-10-01 DIAGNOSIS — Z885 Allergy status to narcotic agent status: Secondary | ICD-10-CM | POA: Diagnosis not present

## 2023-10-01 DIAGNOSIS — T40411A Poisoning by fentanyl or fentanyl analogs, accidental (unintentional), initial encounter: Secondary | ICD-10-CM | POA: Diagnosis not present

## 2023-10-01 DIAGNOSIS — F1120 Opioid dependence, uncomplicated: Secondary | ICD-10-CM | POA: Diagnosis not present

## 2023-10-01 DIAGNOSIS — F17290 Nicotine dependence, other tobacco product, uncomplicated: Secondary | ICD-10-CM | POA: Diagnosis not present

## 2023-10-01 DIAGNOSIS — T50902D Poisoning by unspecified drugs, medicaments and biological substances, intentional self-harm, subsequent encounter: Secondary | ICD-10-CM | POA: Diagnosis not present

## 2023-10-01 DIAGNOSIS — T1491XA Suicide attempt, initial encounter: Secondary | ICD-10-CM | POA: Diagnosis not present

## 2023-10-01 DIAGNOSIS — F17210 Nicotine dependence, cigarettes, uncomplicated: Secondary | ICD-10-CM | POA: Diagnosis not present

## 2023-10-01 DIAGNOSIS — F209 Schizophrenia, unspecified: Secondary | ICD-10-CM | POA: Diagnosis not present

## 2023-10-01 DIAGNOSIS — F332 Major depressive disorder, recurrent severe without psychotic features: Secondary | ICD-10-CM | POA: Diagnosis not present

## 2023-10-01 DIAGNOSIS — F10920 Alcohol use, unspecified with intoxication, uncomplicated: Secondary | ICD-10-CM | POA: Diagnosis not present

## 2023-10-01 DIAGNOSIS — Z560 Unemployment, unspecified: Secondary | ICD-10-CM | POA: Diagnosis not present

## 2023-10-01 DIAGNOSIS — F329 Major depressive disorder, single episode, unspecified: Secondary | ICD-10-CM | POA: Diagnosis not present

## 2023-10-01 DIAGNOSIS — R45851 Suicidal ideations: Secondary | ICD-10-CM | POA: Diagnosis not present

## 2023-10-01 DIAGNOSIS — Z9152 Personal history of nonsuicidal self-harm: Secondary | ICD-10-CM | POA: Diagnosis not present

## 2023-11-07 ENCOUNTER — Emergency Department (HOSPITAL_COMMUNITY)
Admission: EM | Admit: 2023-11-07 | Discharge: 2023-11-07 | Discharge status: Against Medical Advice | Payer: 59 | Source: Home / Self Care

## 2023-11-08 ENCOUNTER — Encounter (HOSPITAL_COMMUNITY): Payer: Self-pay

## 2023-11-08 ENCOUNTER — Other Ambulatory Visit: Payer: Self-pay

## 2023-11-08 ENCOUNTER — Emergency Department (HOSPITAL_COMMUNITY)
Admission: EM | Admit: 2023-11-08 | Discharge: 2023-11-08 | Disposition: A | Discharge status: Home / Self Care | Payer: 59 | Attending: Emergency Medicine | Admitting: Emergency Medicine

## 2023-11-08 DIAGNOSIS — M79652 Pain in left thigh: Secondary | ICD-10-CM | POA: Insufficient documentation

## 2023-11-08 MED ORDER — IBUPROFEN 800 MG PO TABS
800.0000 mg | ORAL_TABLET | Freq: Once | ORAL | Status: AC
  Administered 2023-11-08: 800 mg via ORAL
  Filled 2023-11-08: qty 1

## 2023-11-08 MED ORDER — METHOCARBAMOL 500 MG PO TABS: 500.0000 mg | ORAL_TABLET | Freq: Three times a day (TID) | ORAL | 0 refills | Status: DC | PRN

## 2023-11-08 MED ORDER — METHOCARBAMOL 500 MG PO TABS
500.0000 mg | ORAL_TABLET | Freq: Once | ORAL | Status: AC
  Administered 2023-11-08: 500 mg via ORAL
  Filled 2023-11-08: qty 1

## 2023-11-08 MED ORDER — ADVIL 200 MG PO CAPS: 200.0000 mg | ORAL_CAPSULE | Freq: Four times a day (QID) | ORAL | 0 refills | Status: DC | PRN

## 2023-11-08 NOTE — Discharge Instructions (Signed)
The medicines may help with the pain.  Follow-up with your primary care doctor.

## 2023-11-08 NOTE — ED Triage Notes (Signed)
 Pt to ED via [facility] c/o left hip/knee pain x 3 weeks. No known injury. Ambulatory  Last VS: 118/80, hr112, 97%RA  No medications given by EMS

## 2023-11-08 NOTE — ED Provider Notes (Signed)
 [place] EMERGENCY DEPARTMENT AT [facility] Provider Note   CSN: [id number] Arrival date & time: 11/08/23  1625     History  Chief Complaint  Patient presents with   Hip Pain    LEFT   Knee Pain    LEFT    Mark Hammond is a 32 y.o. male.   Hip Pain  Knee Pain Patient presents with left thigh pain.  Has had for a few weeks now.  States it happened when he went in jail.  No injury.  Has had it previously.  States usually Motrin  and muscle relaxers help.  No chest pain or trouble breathing.  No fevers.  Denies IV drug use but states he does use fentanyl .    Past Medical History:  Diagnosis Date   Alcoholic gastritis    Bipolar 1 disorder (HCC)    Dental abscess    Depression    Drug abuse (HCC)    ETOH abuse    Hallucination    Schizo affective schizophrenia (HCC)     Home Medications Prior to Admission medications   Medication Sig Start Date End Date Taking? Authorizing Provider  methocarbamol  (ROBAXIN ) 500 MG tablet Take 1 tablet (500 mg total) by mouth every 8 (eight) hours as needed. 11/08/23  Yes Patsey Lot, MD  ADVIL  200 MG CAPS Take 1-2 capsules (200-400 mg total) by mouth every 6 (six) hours as needed (for pain or headaches). 11/08/23   Patsey Lot, MD  melatonin 3 MG TABS tablet Take 1 tablet (3 mg total) by mouth at bedtime as needed. Patient taking differently: Take 3 mg by mouth at bedtime as needed (for sleep). 08/07/23   Waymond Sieving, MD  naloxone  (NARCAN ) nasal spray 4 mg/0.1 mL Place 1 spray into the nose once as needed (for an opioid crisis). 08/07/23   [provider]  nicotine  (NICODERM CQ  - DOSED IN MG/24 HOURS) 21 mg/24hr patch Place 1 patch (21 mg total) onto the skin daily as needed (nicotine  cravings). 08/07/23   Waymond Sieving, MD  nicotine  polacrilex (NICORETTE ) 2 MG gum Take 1 each (2 mg total) by mouth as needed for smoking cessation. 08/07/23   Waymond Sieving, MD      Allergies    Codeine    Review of Systems   Review  of Systems  Physical Exam Updated Vital Signs BP 109/67 (BP Location: Left Arm)   Pulse (!) 107   Temp 98.9 F (37.2 C) (Oral)   Resp 17   Ht 5' 4 (1.626 m)   Wt 66.7 kg   SpO2 100%   BMI 25.23 kg/m  Physical Exam Vitals and nursing note reviewed.  Cardiovascular:     Rate and Rhythm: Normal rate.  Abdominal:     Tenderness: There is no abdominal tenderness.  Musculoskeletal:     Comments: Some tenderness with movement at the left knee.  No swelling of the leg.  Neurological:     Mental Status: He is alert.     ED Results / Procedures / Treatments   Labs (all labs ordered are listed, but only abnormal results are displayed) Labs Reviewed - No data to display  EKG None  Radiology No results found.  Procedures Procedures    Medications Ordered in ED Medications  ibuprofen  (ADVIL ) tablet 800 mg (has no administration in time range)  methocarbamol  (ROBAXIN ) tablet 500 mg (has no administration in time range)    ED Course/ Medical Decision Making/ A&P  Medical Decision Making Risk OTC drugs. Prescription drug management.   Patient with somewhat acute on chronic left thigh pain.  Has used Motrin  and muscle relaxers before.  Reviewed previous imaging.  Reassuring at that time.  Do not think we need much more extensive workup and was given prescription for Motrin  and Robaxin .  Can follow-up as needed as an outpatient.        Final Clinical Impression(s) / ED Diagnoses Final diagnoses:  Pain of left thigh    Rx / DC Orders ED Discharge Orders          Ordered    ADVIL  200 MG CAPS  Every 6 hours PRN        11/08/23 1736    methocarbamol  (ROBAXIN ) 500 MG tablet  Every 8 hours PRN        11/08/23 1736              Patsey Lot, MD 11/08/23 1738

## 2023-11-08 NOTE — ED Notes (Signed)
 Pt d/c home per EDP order. Discharge summary reviewed, pt verbalizes understanding. Bus pass provided per request. NAD.

## 2023-11-09 ENCOUNTER — Encounter (HOSPITAL_COMMUNITY): Payer: Self-pay | Admitting: *Deleted

## 2023-11-09 ENCOUNTER — Emergency Department (HOSPITAL_COMMUNITY)
Admission: EM | Admit: 2023-11-09 | Discharge: 2023-11-09 | Disposition: A | Discharge status: Home / Self Care | Payer: 59 | Attending: Emergency Medicine | Admitting: Emergency Medicine

## 2023-11-09 ENCOUNTER — Other Ambulatory Visit: Payer: Self-pay

## 2023-11-09 DIAGNOSIS — R300 Dysuria: Secondary | ICD-10-CM | POA: Insufficient documentation

## 2023-11-09 DIAGNOSIS — M79605 Pain in left leg: Secondary | ICD-10-CM | POA: Insufficient documentation

## 2023-11-09 LAB — URINALYSIS, ROUTINE W REFLEX MICROSCOPIC
Bacteria, UA: NONE SEEN
Glucose, UA: NEGATIVE mg/dL
Hgb urine dipstick: NEGATIVE
Ketones, ur: NEGATIVE mg/dL
Leukocytes,Ua: NEGATIVE
Nitrite: NEGATIVE
Protein, ur: 30 mg/dL — AB
Specific Gravity, Urine: 1.036 — ABNORMAL HIGH (ref 1.005–1.030)
pH: 5 (ref 5.0–8.0)

## 2023-11-09 LAB — RAPID URINE DRUG SCREEN, HOSP PERFORMED
Amphetamines: NOT DETECTED
Barbiturates: NOT DETECTED
Benzodiazepines: POSITIVE — AB
Cocaine: POSITIVE — AB
Opiates: POSITIVE — AB
Tetrahydrocannabinol: POSITIVE — AB

## 2023-11-09 NOTE — ED Triage Notes (Signed)
 BIB [facility] from [facility] on [address] for BLE pain. Pinpoints to lateral thigh, L>R. Seen yesterday at [facility] for the same. Presents here for 2nd opinion. Denies sx other than pain. Positionally worse and better. Prescribed ibuprofen  and robaxin  yesterday and d/c'd. EMS mentions minimally orthostatic VS. Alert, NAD, calm, interactive. Rates pain 8/10.

## 2023-11-09 NOTE — ED Provider Notes (Signed)
 [place] EMERGENCY DEPARTMENT AT [facility] Provider Note   CSN: [id number] Arrival date & time: 11/09/23  1546     History  Chief Complaint  Patient presents with   Leg Pain    Mark Hammond is a 32 y.o. male.   Leg Pain Patient is a 33 year old male presents the ED today complaining of left leg pain.  Was previously seen yesterday and given Robaxin  which he says has helped the pain however presents because he wants a second opinion.  States that the pain came on all of a sudden.  Upon further questioning patient also endorses dysuria x 1.5 weeks.  Denies fever, pain, shortness of breath, abdominal pain, nausea, vomiting, diarrhea, lower extremity swelling.      Home Medications Prior to Admission medications   Medication Sig Start Date End Date Taking? Authorizing Provider  ADVIL  200 MG CAPS Take 1-2 capsules (200-400 mg total) by mouth every 6 (six) hours as needed (for pain or headaches). 11/08/23   Patsey Lot, MD  melatonin 3 MG TABS tablet Take 1 tablet (3 mg total) by mouth at bedtime as needed. Patient taking differently: Take 3 mg by mouth at bedtime as needed (for sleep). 08/07/23   Waymond Sieving, MD  methocarbamol  (ROBAXIN ) 500 MG tablet Take 1 tablet (500 mg total) by mouth every 8 (eight) hours as needed. 11/08/23   Patsey Lot, MD  naloxone  (NARCAN ) nasal spray 4 mg/0.1 mL Place 1 spray into the nose once as needed (for an opioid crisis). 08/07/23   [provider]  nicotine  (NICODERM CQ  - DOSED IN MG/24 HOURS) 21 mg/24hr patch Place 1 patch (21 mg total) onto the skin daily as needed (nicotine  cravings). 08/07/23   Waymond Sieving, MD  nicotine  polacrilex (NICORETTE ) 2 MG gum Take 1 each (2 mg total) by mouth as needed for smoking cessation. 08/07/23   Waymond Sieving, MD      Allergies    Codeine    Review of Systems   Review of Systems  Genitourinary:  Positive for dysuria.  Musculoskeletal:  Positive for myalgias.  All other systems  reviewed and are negative.   Physical Exam Updated Vital Signs BP 112/79 (BP Location: Left Arm)   Pulse (!) 116   Temp 99.1 F (37.3 C) (Oral)   Resp 18   Wt 66.7 kg   SpO2 97%   BMI 25.23 kg/m  Physical Exam Vitals and nursing note reviewed.  Constitutional:      Appearance: Normal appearance.  HENT:     Head: Normocephalic and atraumatic.  Eyes:     Extraocular Movements: Extraocular movements intact.     Conjunctiva/sclera: Conjunctivae normal.  Cardiovascular:     Rate and Rhythm: Normal rate and regular rhythm.     Pulses: Normal pulses.     Heart sounds: Normal heart sounds. No murmur heard.    No friction rub. No gallop.  Pulmonary:     Effort: Pulmonary effort is normal. No respiratory distress.     Breath sounds: Normal breath sounds.  Abdominal:     General: Abdomen is flat.     Palpations: Abdomen is soft.     Tenderness: There is no abdominal tenderness.  Musculoskeletal:        General: Tenderness (Mild pinpoint tenderness noted over the anterior aspect of left thigh.) present. No swelling, deformity or signs of injury. Normal range of motion.     Right lower leg: No edema.     Left lower leg: No  edema.  Skin:    General: Skin is warm and dry.     Coloration: Skin is not jaundiced or pale.     Findings: No bruising or erythema.  Neurological:     General: No focal deficit present.     Mental Status: He is alert. Mental status is at baseline.  Psychiatric:        Mood and Affect: Mood normal.     ED Results / Procedures / Treatments   Labs (all labs ordered are listed, but only abnormal results are displayed) Labs Reviewed  URINALYSIS, ROUTINE W REFLEX MICROSCOPIC - Abnormal; Notable for the following components:      Result Value   Color, Urine AMBER (*)    Specific Gravity, Urine 1.036 (*)    Bilirubin Urine SMALL (*)    Protein, ur 30 (*)    All other components within normal limits  RAPID URINE DRUG SCREEN, HOSP PERFORMED - Abnormal;  Notable for the following components:   Opiates POSITIVE (*)    Cocaine  POSITIVE (*)    Benzodiazepines POSITIVE (*)    Tetrahydrocannabinol POSITIVE (*)    All other components within normal limits    EKG None  Radiology No results found.  Procedures Procedures    Medications Ordered in ED Medications - No data to display  ED Course/ Medical Decision Making/ A&P                                 Medical Decision Making Amount and/or Complexity of Data Reviewed Labs: ordered.   This patient is a 33 year old male who presents to the ED for concern of bilateral thigh pain seen yesterday at [facility] and presents for second opinion.   Differential diagnoses prior to evaluation: The emergent differential diagnosis includes, but is not limited to, muscle strain, fracture, ligamentous injury, cellulitis, dermatitis, cauda equina syndrome, spinal abscess. This is not an exhaustive differential.   Past Medical History / Co-morbidities / Social History: MDD, sedative, hypnotic, anxiety lytic use disorder, methamphetamine disorder, alcohol disorder, polysubstance abuse, schizophrenia, schizoaffective disorder, hepatitis C, tobacco use disorder, cannabis use disorder, bipolar 1 disorder  Additional history: Chart reviewed. Pertinent results include:  Patient was seen yesterday for same pain.  Discharged with Robaxin  and Advil .  X-ray of right femur was done on 06/15/2023 which showed nail for triangular density overlying soft tissue of the right hip possible foreign body.   Lab Tests/Imaging studies: I personally interpreted labs/imaging and the pertinent results include:  UA shows elevated specific gravity 1.036, small bilirubin, protein 30. UDS was positive for opioids, cocaine , benzodiazepines, THC.  Considered imaging however do not believe is necessary at this time as no swelling, erythema, worsening tenderness noted.   Medications: No medications needed for this visit.   I have reviewed the patients home medicines and have made adjustments as needed.  ED Course:   Patient presents the ED with a 1.5-week history of left leg pain.  Says this happened all of a sudden and did not have any injury, trauma.  Has been seen previously yesterday and was given Robaxin  which she says has helped with the pain.  Has ambulated today without difficulty.  Upon further questioning patient says that his urine burns but thinks that it is due to him laying down for the leg pain.  Patient is afebrile, in no acute distress.  Pinpoint tenderness noted over the anterior aspect of the left  thigh.  No erythema, swelling, injury noted.  Exam is otherwise unremarkable.  UA was done and showed Small amount bilirubin but no sign of UTI and UDS showed positive for opioids, cocaine , THC, benzos.  Patient vitals had remained stable at the course of his time in the ED.  Patient was noted to have normal heart rate on reevaluation.  Recommend this patient continue to take pain medication previously prescribed at previous visit and to schedule a appointment with PCP, patient was provided information necessary to schedule this visit.  Low suspicion for any acute pathology or any emergent pathology at this time.  I believe this patient safe discharge at this time   Disposition: After consideration of the diagnostic results and the patients response to treatment, I feel that the patient benefit from discharge and treatment as above.   emergency department workup does not suggest an emergent condition requiring admission or immediate intervention beyond what has been performed at this time. The plan is: Continued pain management, monitor symptoms, schedule PCP visit, return for new or worsening symptoms. The patient is safe for discharge and has been instructed to return immediately for worsening symptoms, change in symptoms or any other concerns.  Final Clinical Impression(s) / ED Diagnoses Final  diagnoses:  Left leg pain    Rx / DC Orders ED Discharge Orders     None         Beola Terrall GORMAN DEVONNA 11/09/23 2015    Tegeler, Lonni PARAS, MD 11/09/23 2035

## 2023-11-09 NOTE — Discharge Instructions (Addendum)
 You were seen today for left leg pain.  Your physical exam and labs were both reassuring today.  Low suspicion for any infectious cause for this.  You are not experiencing any UTI causing the dysuria.  Recommend that you continue to monitor symptoms and follow-up with PCP if needed for further evaluation.  Especially with previous use history, recommend that you continue to be followed by primary care.  Continue to use pain medication previously prescribed to you including the muscle relaxer.  You can continue to use ibuprofen  as well  Take Ibuprofen  400mg  every 4-6 hours for pain or fever, not exceeding 3,200 mg per day as more than 3,200mg  can cause Stomach irritation, dizziness, kidney issues with long-term use.  Return to the ED if you begin experiencing worsening fever, numbness, weakness.

## 2023-11-19 ENCOUNTER — Other Ambulatory Visit: Payer: Self-pay

## 2023-11-19 ENCOUNTER — Emergency Department (HOSPITAL_COMMUNITY)
Admission: EM | Admit: 2023-11-19 | Discharge: 2023-11-20 | Disposition: A | Discharge status: Home / Self Care | Payer: MEDICAID | Attending: Emergency Medicine | Admitting: Emergency Medicine

## 2023-11-19 ENCOUNTER — Emergency Department (HOSPITAL_COMMUNITY): Payer: MEDICAID

## 2023-11-19 DIAGNOSIS — M79605 Pain in left leg: Secondary | ICD-10-CM | POA: Diagnosis present

## 2023-11-19 DIAGNOSIS — G8929 Other chronic pain: Secondary | ICD-10-CM | POA: Diagnosis not present

## 2023-11-19 LAB — CBC WITH DIFFERENTIAL/PLATELET
Abs Immature Granulocytes: 0.03 10*3/uL (ref 0.00–0.07)
Basophils Absolute: 0.1 10*3/uL (ref 0.0–0.1)
Basophils Relative: 1 %
Eosinophils Absolute: 0.1 10*3/uL (ref 0.0–0.5)
Eosinophils Relative: 1 %
HCT: 37.7 % — ABNORMAL LOW (ref 39.0–52.0)
Hemoglobin: 12.6 g/dL — ABNORMAL LOW (ref 13.0–17.0)
Immature Granulocytes: 0 %
Lymphocytes Relative: 22 %
Lymphs Abs: 1.9 10*3/uL (ref 0.7–4.0)
MCH: 31.5 pg (ref 26.0–34.0)
MCHC: 33.4 g/dL (ref 30.0–36.0)
MCV: 94.3 fL (ref 80.0–100.0)
Monocytes Absolute: 0.9 10*3/uL (ref 0.1–1.0)
Monocytes Relative: 11 %
Neutro Abs: 5.5 10*3/uL (ref 1.7–7.7)
Neutrophils Relative %: 65 %
Platelets: 267 10*3/uL (ref 150–400)
RBC: 4 MIL/uL — ABNORMAL LOW (ref 4.22–5.81)
RDW: 13.2 % (ref 11.5–15.5)
WBC: 8.4 10*3/uL (ref 4.0–10.5)
nRBC: 0 % (ref 0.0–0.2)

## 2023-11-19 LAB — BASIC METABOLIC PANEL
Anion gap: 9 (ref 5–15)
BUN: <5 mg/dL — ABNORMAL LOW (ref 6–20)
CO2: 26 mmol/L (ref 22–32)
Calcium: 8.2 mg/dL — ABNORMAL LOW (ref 8.9–10.3)
Chloride: 103 mmol/L (ref 98–111)
Creatinine, Ser: 0.5 mg/dL — ABNORMAL LOW (ref 0.61–1.24)
GFR, Estimated: >60 mL/min (ref >60)
Glucose, Bld: 123 mg/dL — ABNORMAL HIGH (ref 70–99)
Potassium: 3.4 mmol/L — ABNORMAL LOW (ref 3.5–5.1)
Sodium: 138 mmol/L (ref 135–145)

## 2023-11-19 LAB — I-STAT CHEM 8, ED
BUN: <3 mg/dL — ABNORMAL LOW (ref 6–20)
Calcium, Ion: 1.05 mmol/L — ABNORMAL LOW (ref 1.15–1.40)
Chloride: 101 mmol/L (ref 98–111)
Creatinine, Ser: 0.7 mg/dL (ref 0.61–1.24)
Glucose, Bld: 126 mg/dL — ABNORMAL HIGH (ref 70–99)
HCT: 38 % — ABNORMAL LOW (ref 39.0–52.0)
Hemoglobin: 12.9 g/dL — ABNORMAL LOW (ref 13.0–17.0)
Potassium: 3.5 mmol/L (ref 3.5–5.1)
Sodium: 141 mmol/L (ref 135–145)
TCO2: 27 mmol/L (ref 22–32)

## 2023-11-19 LAB — RESP PANEL BY RT-PCR (RSV, FLU A&B, COVID)  RVPGX2
Influenza A by PCR: NEGATIVE
Influenza B by PCR: NEGATIVE
Resp Syncytial Virus by PCR: NEGATIVE
SARS Coronavirus 2 by RT PCR: NEGATIVE

## 2023-11-19 LAB — SEDIMENTATION RATE: Sed Rate: 77 mm/h — ABNORMAL HIGH (ref 0–16)

## 2023-11-19 MED ORDER — HYDROCODONE-ACETAMINOPHEN 5-325 MG PO TABS
1.0000 | ORAL_TABLET | Freq: Once | ORAL | Status: AC
  Administered 2023-11-19: 1 via ORAL
  Filled 2023-11-19: qty 1

## 2023-11-19 MED ORDER — KETOROLAC TROMETHAMINE 15 MG/ML IJ SOLN
15.0000 mg | Freq: Once | INTRAMUSCULAR | Status: AC
  Administered 2023-11-20: 15 mg via INTRAVENOUS
  Filled 2023-11-19: qty 1

## 2023-11-19 MED ORDER — LORAZEPAM 2 MG/ML IJ SOLN
1.0000 mg | Freq: Once | INTRAMUSCULAR | Status: AC
  Administered 2023-11-19: 1 mg via INTRAVENOUS
  Filled 2023-11-19: qty 1

## 2023-11-19 MED ORDER — DEXAMETHASONE SODIUM PHOSPHATE 10 MG/ML IJ SOLN
10.0000 mg | Freq: Once | INTRAMUSCULAR | Status: AC
  Administered 2023-11-20: 10 mg via INTRAVENOUS
  Filled 2023-11-19: qty 1

## 2023-11-19 NOTE — ED Provider Notes (Incomplete)
 [place] EMERGENCY DEPARTMENT AT [facility] Provider Note   CSN: [id number] Arrival date & time: 11/19/23  1821     History {Add pertinent medical, surgical, social history, OB history to HPI:1} Chief Complaint  Patient presents with  . Leg Pain    Mark Hammond is a 33 y.o. male history of IV drug use last use last year, polysubstance abuse, hepatitis C C, bipolar 1 presented with left leg pain going on for the past month.  Patient been seen twice for this initially discharged.  Patient has tried opiates along with marijuana to no relief.  Patient states he has low back pain shooting down to his left knee and some saddle discomfort.  Patient denies any urinary or bowel incontinence.  Patient states that he cannot walk now due to the pain.  Patient still feels foot and move his leg.  Patient denies any fevers, chest pain, shortness of breath, abdominal pain, upper extremity symptoms.  Home Medications Prior to Admission medications   Medication Sig Start Date End Date Taking? Authorizing Provider  ADVIL 200 MG CAPS Take 1-2 capsules (200-400 mg total) by mouth every 6 (six) hours as needed (for pain or headaches). 11/08/23   Benjiman Core, MD  melatonin 3 MG TABS tablet Take 1 tablet (3 mg total) by mouth at bedtime as needed. Patient taking differently: Take 3 mg by mouth at bedtime as needed (for sleep). 08/07/23   Augusto Gamble, MD  methocarbamol (ROBAXIN) 500 MG tablet Take 1 tablet (500 mg total) by mouth every 8 (eight) hours as needed. 11/08/23   Benjiman Core, MD  naloxone [facility]) nasal spray 4 mg/0.1 mL Place 1 spray into the nose once as needed (for an opioid crisis). 08/07/23   [provider]  nicotine (NICODERM CQ - DOSED IN MG/24 HOURS) 21 mg/24hr patch Place 1 patch (21 mg total) onto the skin daily as needed (nicotine cravings). 08/07/23   Augusto Gamble, MD  nicotine polacrilex (NICORETTE) 2 MG gum Take 1 each (2 mg total) by mouth as needed for smoking  cessation. 08/07/23   Augusto Gamble, MD      Allergies    Codeine    Review of Systems   Review of Systems  Physical Exam Updated Vital Signs BP 127/75 (BP Location: Left Arm)   Pulse 100   Temp 97.9 F (36.6 C) (Oral)   Resp 16   SpO2 98%  Physical Exam Constitutional:      General: He is in acute distress.  Cardiovascular:     Rate and Rhythm: Normal rate and regular rhythm.     Pulses: Normal pulses.     Heart sounds: Normal heart sounds.  Pulmonary:     Effort: Pulmonary effort is normal. No respiratory distress.     Breath sounds: Normal breath sounds.  Abdominal:     Palpations: Abdomen is soft.     Tenderness: There is no abdominal tenderness. There is no guarding or rebound.  Musculoskeletal:     Comments: Tenderness to the lumbar midline without bony abnormalities Positive left-sided straight leg test 5, 5 left side hip flexion, knee extension/flexion, plantar/dorsiflexion  Skin:    General: Skin is warm and dry.     Capillary Refill: Capillary refill takes less than 2 seconds.     Comments: No signs of track marks  Neurological:     Mental Status: He is alert and oriented to person, place, and time.     Comments: Sensation intact  Psychiatric:  Mood and Affect: Mood normal.    ED Results / Procedures / Treatments   Labs (all labs ordered are listed, but only abnormal results are displayed) Labs Reviewed  CBC WITH DIFFERENTIAL/PLATELET - Abnormal; Notable for the following components:      Result Value   RBC 4.00 (*)    Hemoglobin 12.6 (*)    HCT 37.7 (*)    All other components within normal limits  I-STAT CHEM 8, ED - Abnormal; Notable for the following components:   BUN <3 (*)    Glucose, Bld 126 (*)    Calcium, Ion 1.05 (*)    Hemoglobin 12.9 (*)    HCT 38.0 (*)    All other components within normal limits  RESP PANEL BY RT-PCR (RSV, FLU A&B, COVID)  RVPGX2  BASIC METABOLIC PANEL  SEDIMENTATION RATE  C-REACTIVE PROTEIN  URINALYSIS,  ROUTINE W REFLEX MICROSCOPIC    EKG None  Radiology No results found.  Procedures Procedures  {Document cardiac monitor, telemetry assessment procedure when appropriate:1}  Medications Ordered in ED Medications  LORazepam (ATIVAN) injection 1 mg (has no administration in time range)  HYDROcodone-acetaminophen (NORCO/VICODIN) 5-325 MG per tablet 1 tablet (1 tablet Oral Given 11/19/23 2122)    ED Course/ Medical Decision Making/ A&P   {   Click here for ABCD2, HEART and other calculatorsREFRESH Note before signing :1}                              Medical Decision Making Amount and/or Complexity of Data Reviewed Labs: ordered. Radiology: ordered.  Risk Prescription drug management.   Mark Hammond 33 y.o. presented today for back pain. Working DDx that I considered at this time includes, but not limited to, MSK, underlying fracture, epidural hematoma/abscess, cauda equina syndrome, spinal stenosis, spinal malignancy, discitis, spinal infection, spondylitises/ spondylosis, conus medullaris, DDD of the back.  R/o DDx: *** : less likely due to history of present illness, physical exam, labs/imaging findings.  Review of prior external notes: 11/09/2023 ED  Unique Tests and My Interpretation:  CBC: Unremarkable I-STAT Chem-8: Unremarkable Respiratory panel: Negative CRP: ESR: 77 BMP: Unremarkable UA: pending UDS: pending MR w/wo contrast lumbar spine: pending  Social Determinants of Health: EtOH/Substance Abuse  Discussion with Independent Historian: None  Discussion of Management of Tests: None  Risk: Medium: prescription drug management  Risk Stratification Score: none  Staffed with Silverio Lay, MD  Plan: On exam patient was in acute distress with stable vitals.  Patient does have lumbar midline tenderness without tenderness elsewhere in the spine or abdominal tenderness.  Patient is having shooting pain down to his left knee and some saddle discomfort on the left side.   Patient has a positive straight leg test on the left side.  Patient has a history of IV drug use and so Given concern that he has been seen 3 times for this will obtain advanced imaging to rule out epidural abscess.  Labs will be ordered as well as pain meds.  Patient signed out to [place], [place].  Please review their note for the continuation of patient's care.  The plan at this point is follow-up on labs and imaging and dispo accordingly.  This chart was dictated using voice recognition software.  Despite best efforts to proofread,  errors can occur which can change the documentation meaning.   {Document critical care time when appropriate:1} {Document review of labs and clinical decision tools ie heart score, Chads2Vasc2 etc:1}  {  Document your independent review of radiology images, and any outside records:1} {Document your discussion with family members, caretakers, and with consultants:1} {Document social determinants of health affecting pt's care:1} {Document your decision making why or why not admission, treatments were needed:1} Final Clinical Impression(s) / ED Diagnoses Final diagnoses:  None    Rx / DC Orders ED Discharge Orders     None

## 2023-11-19 NOTE — ED Provider Notes (Signed)
 [place] EMERGENCY DEPARTMENT AT [facility] Provider Note   CSN: [id number] Arrival date & time: 11/19/23  1821     History {Add pertinent medical, surgical, social history, OB history to HPI:1} Chief Complaint  Patient presents with  . Leg Pain    Joann Kulpa is a 33 y.o. male history of IV drug use last use last year, polysubstance abuse, hepatitis C C, bipolar 1 presented with left leg pain going on for the past month.  Patient been seen twice for this initially discharged.  Patient has tried opiates along with marijuana to no relief.  Patient states he has low back pain shooting down to his left knee and some saddle discomfort.  Patient denies any urinary or bowel incontinence.  Patient states that he cannot walk now due to the pain.  Patient still feels foot and move his leg.  Patient denies any fevers, chest pain, shortness of breath, abdominal pain, upper extremity symptoms.  Home Medications Prior to Admission medications   Medication Sig Start Date End Date Taking? Authorizing Provider  ADVIL 200 MG CAPS Take 1-2 capsules (200-400 mg total) by mouth every 6 (six) hours as needed (for pain or headaches). 11/08/23   Benjiman Core, MD  melatonin 3 MG TABS tablet Take 1 tablet (3 mg total) by mouth at bedtime as needed. Patient taking differently: Take 3 mg by mouth at bedtime as needed (for sleep). 08/07/23   Augusto Gamble, MD  methocarbamol (ROBAXIN) 500 MG tablet Take 1 tablet (500 mg total) by mouth every 8 (eight) hours as needed. 11/08/23   Benjiman Core, MD  naloxone [facility]) nasal spray 4 mg/0.1 mL Place 1 spray into the nose once as needed (for an opioid crisis). 08/07/23   [provider]  nicotine (NICODERM CQ - DOSED IN MG/24 HOURS) 21 mg/24hr patch Place 1 patch (21 mg total) onto the skin daily as needed (nicotine cravings). 08/07/23   Augusto Gamble, MD  nicotine polacrilex (NICORETTE) 2 MG gum Take 1 each (2 mg total) by mouth as needed for smoking  cessation. 08/07/23   Augusto Gamble, MD      Allergies    Codeine    Review of Systems   Review of Systems  Physical Exam Updated Vital Signs BP 127/75 (BP Location: Left Arm)   Pulse 100   Temp 97.9 F (36.6 C) (Oral)   Resp 16   SpO2 98%  Physical Exam Constitutional:      General: He is in acute distress.  Cardiovascular:     Rate and Rhythm: Normal rate and regular rhythm.     Pulses: Normal pulses.     Heart sounds: Normal heart sounds.  Pulmonary:     Effort: Pulmonary effort is normal. No respiratory distress.     Breath sounds: Normal breath sounds.  Abdominal:     Palpations: Abdomen is soft.     Tenderness: There is no abdominal tenderness. There is no guarding or rebound.  Musculoskeletal:     Comments: Tenderness to the lumbar midline without bony abnormalities Positive left-sided straight leg test 5, 5 left side hip flexion, knee extension/flexion, plantar/dorsiflexion  Skin:    General: Skin is warm and dry.     Capillary Refill: Capillary refill takes less than 2 seconds.     Comments: No signs of track marks  Neurological:     Mental Status: He is alert and oriented to person, place, and time.     Comments: Sensation intact  Psychiatric:  Mood and Affect: Mood normal.    ED Results / Procedures / Treatments   Labs (all labs ordered are listed, but only abnormal results are displayed) Labs Reviewed  CBC WITH DIFFERENTIAL/PLATELET - Abnormal; Notable for the following components:      Result Value   RBC 4.00 (*)    Hemoglobin 12.6 (*)    HCT 37.7 (*)    All other components within normal limits  I-STAT CHEM 8, ED - Abnormal; Notable for the following components:   BUN <3 (*)    Glucose, Bld 126 (*)    Calcium, Ion 1.05 (*)    Hemoglobin 12.9 (*)    HCT 38.0 (*)    All other components within normal limits  RESP PANEL BY RT-PCR (RSV, FLU A&B, COVID)  RVPGX2  BASIC METABOLIC PANEL  SEDIMENTATION RATE  C-REACTIVE PROTEIN  URINALYSIS,  ROUTINE W REFLEX MICROSCOPIC    EKG None  Radiology No results found.  Procedures Procedures  {Document cardiac monitor, telemetry assessment procedure when appropriate:1}  Medications Ordered in ED Medications  LORazepam (ATIVAN) injection 1 mg (has no administration in time range)  HYDROcodone-acetaminophen (NORCO/VICODIN) 5-325 MG per tablet 1 tablet (1 tablet Oral Given 11/19/23 2122)    ED Course/ Medical Decision Making/ A&P   {   Click here for ABCD2, HEART and other calculatorsREFRESH Note before signing :1}                              Medical Decision Making Amount and/or Complexity of Data Reviewed Labs: ordered. Radiology: ordered.  Risk Prescription drug management.   [name] 33 y.o. presented today for back pain. Working DDx that I considered at this time includes, but not limited to, MSK, underlying fracture, epidural hematoma/abscess, cauda equina syndrome, spinal stenosis, spinal malignancy, discitis, spinal infection, spondylitises/ spondylosis, conus medullaris, DDD of the back.  R/o DDx: *** : less likely due to history of present illness, physical exam, labs/imaging findings.  Review of prior external notes: 11/09/2023 ED  Unique Tests and My Interpretation:  CBC: Unremarkable I-STAT Chem-8: Unremarkable Respiratory panel: Negative CRP: ESR: BMP: Unremarkable UA: UDS:  Social Determinants of Health: EtOH/Substance Abuse  Discussion with Independent Historian: None  Discussion of Management of Tests: {historian:29369}  Risk: {Risk:29370}  Risk Stratification Score: none  Staffed with Silverio Lay, MD  Plan: On exam patient was in acute distress with stable vitals.  Patient does have lumbar midline tenderness without tenderness elsewhere in the spine or abdominal tenderness.  Patient is having shooting pain down to his left knee and some saddle discomfort on the left side.  Patient has a positive straight leg test on the left side.  Patient  has a history of IV drug use and so Given concern that he has been seen 3 times for this will obtain advanced imaging to rule out epidural abscess.  Labs will be ordered as well as pain meds.  Patient was given return precautions. Patient stable for discharge at this time.  Patient verbalized understanding of plan.  This chart was dictated using voice recognition software.  Despite best efforts to proofread,  errors can occur which can change the documentation meaning.   {Document critical care time when appropriate:1} {Document review of labs and clinical decision tools ie heart score, Chads2Vasc2 etc:1}  {Document your independent review of radiology images, and any outside records:1} {Document your discussion with family members, caretakers, and with consultants:1} {Document social determinants of  health affecting pt's care:1} {Document your decision making why or why not admission, treatments were needed:1} Final Clinical Impression(s) / ED Diagnoses Final diagnoses:  None    Rx / DC Orders ED Discharge Orders     None

## 2023-11-19 NOTE — ED Triage Notes (Signed)
 [place]IB[place], c/o left side leg pain ongoing for one month. Pt was seen previously for the same and was unable to fill prescriptions. 190 palp 96% room air 105 HR 174 cbg

## 2023-11-20 DIAGNOSIS — M79605 Pain in left leg: Secondary | ICD-10-CM | POA: Diagnosis not present

## 2023-11-20 LAB — C-REACTIVE PROTEIN: CRP: 4.2 mg/dL — ABNORMAL HIGH (ref <1.0)

## 2023-11-20 MED ORDER — GADOBUTROL 1 MMOL/ML IV SOLN
6.0000 mL | Freq: Once | INTRAVENOUS | Status: AC | PRN
  Administered 2023-11-20: 6 mL via INTRAVENOUS

## 2023-11-20 NOTE — ED Provider Notes (Signed)
 1:53 AM Care assumed from [place], PA-C at shift change pending MRI. Complaining of chronic low back and LLE pain x 1 month. Seen in the ED previously for same.  MRI reviewed which is negative for acute pathology. No cord impingement, osteomyelitis, discitis, epidural abscess. Hx of IVDU, polysubstance abuse; no indication for narcotic Rx for pain. Has previously been prescribed NSAIDs and Robaxin. Patient encouraged to pick up these medications. Return precautions discussed and provided. Patient discharged in stable condition with no unaddressed concerns.   MR Lumbar Spine W Wo Contrast (assess for abscess, cord compression) Result Date: 11/20/2023 CLINICAL DATA:  Initial evaluation for acute low back pain, history of IVDU. EXAM: MRI LUMBAR SPINE WITHOUT AND WITH CONTRAST TECHNIQUE: Multiplanar and multiecho pulse sequences of the lumbar spine were obtained without and with intravenous contrast. CONTRAST:  6mL GADAVIST GADOBUTROL 1 MMOL/ML IV SOLN COMPARISON:  None Available. FINDINGS: Segmentation: Standard. Lowest well-formed disc space labeled the L5-S1 level. Alignment: Physiologic with preservation of the normal lumbar lordosis. No listhesis. Vertebrae: Vertebral body height maintained without acute or chronic fracture. Bone marrow signal intensity within normal limits. No worrisome osseous lesions. No evidence for osteomyelitis discitis or septic arthritis. Conus medullaris and cauda equina: Conus extends to the L1 level. Conus and cauda equina appear normal. Paraspinal and other soft tissues: Unremarkable. Disc levels: No significant disc pathology seen within the lumbar spine. Intervertebral discs are well hydrated with preserved disc height. No significant disc bulge or focal disc herniation. Minimal for age lower lumbar facet hypertrophy. No significant spinal stenosis. Foramina remain patent. No impingement. IMPRESSION: Normal MRI of the lumbar spine. No evidence for acute infection or other  abnormality. No significant disc pathology, stenosis, or evidence for neural impingement. Electronically Signed   By: Rise Mu M.D.   On: 11/20/2023 00:57      Antony Madura, PA-C 11/20/23 1610    Nira Conn, MD 11/20/23 [phone]

## 2023-11-20 NOTE — Discharge Instructions (Signed)
 Your MRI did not show any evidence of infection, spinal abscess, or impingement on your spinal cord or sciatic nerve.  We recommend that you take ibuprofen and Robaxin as previously prescribed to you.  Alternate ice and heat to areas of pain to limit inflammation and spasm.  You may also try topical magnesium cream which can be purchased over-the-counter.  Follow-up with a primary care doctor.  You may return for new or concerning symptoms.

## 2023-11-21 ENCOUNTER — Ambulatory Visit (HOSPITAL_COMMUNITY)
Admission: EM | Admit: 2023-11-21 | Discharge: 2023-11-21 | Disposition: A | Discharge status: Home / Self Care | Payer: 59

## 2023-11-21 ENCOUNTER — Other Ambulatory Visit: Payer: Self-pay

## 2023-11-21 ENCOUNTER — Emergency Department (HOSPITAL_BASED_OUTPATIENT_CLINIC_OR_DEPARTMENT_OTHER)
Admission: EM | Admit: 2023-11-21 | Discharge: 2023-11-21 | Disposition: A | Discharge status: Home / Self Care | Payer: 59 | Attending: Emergency Medicine | Admitting: Emergency Medicine

## 2023-11-21 ENCOUNTER — Encounter (HOSPITAL_BASED_OUTPATIENT_CLINIC_OR_DEPARTMENT_OTHER): Payer: Self-pay | Admitting: Emergency Medicine

## 2023-11-21 ENCOUNTER — Other Ambulatory Visit (HOSPITAL_COMMUNITY)
Admission: EM | Admit: 2023-11-21 | Discharge: 2023-11-27 | Disposition: A | Discharge status: Home / Self Care | Payer: 59 | Attending: Psychiatry | Admitting: Psychiatry

## 2023-11-21 DIAGNOSIS — F1910 Other psychoactive substance abuse, uncomplicated: Secondary | ICD-10-CM

## 2023-11-21 DIAGNOSIS — M79605 Pain in left leg: Secondary | ICD-10-CM | POA: Diagnosis not present

## 2023-11-21 DIAGNOSIS — Z91148 Patient's other noncompliance with medication regimen for other reason: Secondary | ICD-10-CM | POA: Diagnosis not present

## 2023-11-21 DIAGNOSIS — F250 Schizoaffective disorder, bipolar type: Secondary | ICD-10-CM | POA: Diagnosis not present

## 2023-11-21 DIAGNOSIS — F1010 Alcohol abuse, uncomplicated: Secondary | ICD-10-CM | POA: Diagnosis not present

## 2023-11-21 MED ORDER — CLONIDINE HCL 0.1 MG PO TABS
0.1000 mg | ORAL_TABLET | Freq: Four times a day (QID) | ORAL | Status: DC
  Administered 2023-11-21: 0.1 mg via ORAL
  Filled 2023-11-21: qty 1

## 2023-11-21 MED ORDER — MAGNESIUM HYDROXIDE 400 MG/5ML PO SUSP: 30.0000 mL | Freq: Every day | ORAL | Status: DC | PRN

## 2023-11-21 MED ORDER — LOPERAMIDE HCL 2 MG PO CAPS: 2.0000 mg | ORAL_CAPSULE | ORAL | Status: AC | PRN

## 2023-11-21 MED ORDER — DICYCLOMINE HCL 20 MG PO TABS
20.0000 mg | ORAL_TABLET | Freq: Four times a day (QID) | ORAL | Status: AC | PRN
  Administered 2023-11-22 – 2023-11-26 (×2): 20 mg via ORAL
  Filled 2023-11-21 (×2): qty 1

## 2023-11-21 MED ORDER — ALUM & MAG HYDROXIDE-SIMETH 200-200-20 MG/5ML PO SUSP: 30.0000 mL | ORAL | Status: DC | PRN

## 2023-11-21 MED ORDER — OLANZAPINE 10 MG IM SOLR: 10.0000 mg | Freq: Three times a day (TID) | INTRAMUSCULAR | Status: DC | PRN

## 2023-11-21 MED ORDER — HYDROXYZINE HCL 25 MG PO TABS
25.0000 mg | ORAL_TABLET | Freq: Four times a day (QID) | ORAL | Status: AC | PRN
  Administered 2023-11-23 – 2023-11-26 (×3): 25 mg via ORAL
  Filled 2023-11-21: qty 1
  Filled 2023-11-21: qty 20
  Filled 2023-11-21: qty 1
  Filled 2023-11-21: qty 15
  Filled 2023-11-21: qty 1

## 2023-11-21 MED ORDER — OLANZAPINE 10 MG IM SOLR: 5.0000 mg | Freq: Three times a day (TID) | INTRAMUSCULAR | Status: DC | PRN

## 2023-11-21 MED ORDER — DEXAMETHASONE SODIUM PHOSPHATE 10 MG/ML IJ SOLN
10.0000 mg | Freq: Once | INTRAMUSCULAR | Status: AC
  Administered 2023-11-21: 10 mg via INTRAMUSCULAR
  Filled 2023-11-21: qty 1

## 2023-11-21 MED ORDER — METHOCARBAMOL 500 MG PO TABS
500.0000 mg | ORAL_TABLET | Freq: Three times a day (TID) | ORAL | Status: AC | PRN
  Administered 2023-11-22 – 2023-11-26 (×8): 500 mg via ORAL
  Filled 2023-11-21 (×8): qty 1

## 2023-11-21 MED ORDER — CLONIDINE HCL 0.1 MG PO TABS: 0.1000 mg | ORAL_TABLET | Freq: Every day | ORAL | Status: DC

## 2023-11-21 MED ORDER — CLONIDINE HCL 0.1 MG PO TABS: 0.1000 mg | ORAL_TABLET | ORAL | Status: DC

## 2023-11-21 MED ORDER — NAPROXEN 500 MG PO TABS
500.0000 mg | ORAL_TABLET | Freq: Two times a day (BID) | ORAL | Status: AC | PRN
  Administered 2023-11-22 – 2023-11-26 (×9): 500 mg via ORAL
  Filled 2023-11-21 (×9): qty 1

## 2023-11-21 MED ORDER — HYDROCODONE-ACETAMINOPHEN 5-325 MG PO TABS
1.0000 | ORAL_TABLET | Freq: Once | ORAL | Status: DC
  Filled 2023-11-21: qty 1

## 2023-11-21 MED ORDER — IBUPROFEN 800 MG PO TABS
800.0000 mg | ORAL_TABLET | Freq: Once | ORAL | Status: AC
  Administered 2023-11-21: 800 mg via ORAL
  Filled 2023-11-21: qty 1

## 2023-11-21 MED ORDER — ACETAMINOPHEN 325 MG PO TABS
650.0000 mg | ORAL_TABLET | Freq: Four times a day (QID) | ORAL | Status: DC | PRN
  Administered 2023-11-23 – 2023-11-27 (×5): 650 mg via ORAL
  Filled 2023-11-21 (×5): qty 2

## 2023-11-21 MED ORDER — ONDANSETRON 4 MG PO TBDP: 4.0000 mg | ORAL_TABLET | Freq: Four times a day (QID) | ORAL | Status: AC | PRN

## 2023-11-21 MED ORDER — OLANZAPINE 5 MG PO TBDP: 5.0000 mg | ORAL_TABLET | Freq: Three times a day (TID) | ORAL | Status: DC | PRN

## 2023-11-21 NOTE — ED Provider Notes (Signed)
 [facility] AT [facility] Provider Note   CSN: [id number] Arrival date & time: 11/21/23  1046     History  Chief Complaint  Patient presents with   Leg Pain    Mark Hammond is a 33 y.o. male.  With a history of anxiety, depression, schizophrenia, polysubstance abuse with IV drug use reported 4 years ago presenting to the ED for evaluation of left leg pain.  States the symptoms began approximately 1 month ago.  Pain starts in the left lateral upper leg and radiates down to the lateral aspect of the knee.  Described as a spasm and tight pain.  He denies back pain.  No recent trauma.  No recent injection drug use, states he was febrile when he presented to the emergency department 2 days ago.  He denies any nausea or vomiting.  States he has been unable to pick up the medications because he is homeless.  He has been taking Tylenol with minimal improvement in his symptoms.  He denies numbness, weakness or tingling.  States weightbearing has been difficult today due to the pain.  He denies urinary or fecal incontinence.   Leg Pain      Home Medications Prior to Admission medications   Medication Sig Start Date End Date Taking? Authorizing Provider  ADVIL 200 MG CAPS Take 1-2 capsules (200-400 mg total) by mouth every 6 (six) hours as needed (for pain or headaches). 11/08/23   Benjiman Core, MD  melatonin 3 MG TABS tablet Take 1 tablet (3 mg total) by mouth at bedtime as needed. Patient taking differently: Take 3 mg by mouth at bedtime as needed (for sleep). 08/07/23   Augusto Gamble, MD  methocarbamol (ROBAXIN) 500 MG tablet Take 1 tablet (500 mg total) by mouth every 8 (eight) hours as needed. 11/08/23   Benjiman Core, MD  naloxone [facility]) nasal spray 4 mg/0.1 mL Place 1 spray into the nose once as needed (for an opioid crisis). 08/07/23   [provider]  nicotine (NICODERM CQ - DOSED IN MG/24 HOURS) 21 mg/24hr patch Place 1 patch (21 mg total) onto  the skin daily as needed (nicotine cravings). 08/07/23   Augusto Gamble, MD  nicotine polacrilex (NICORETTE) 2 MG gum Take 1 each (2 mg total) by mouth as needed for smoking cessation. 08/07/23   Augusto Gamble, MD      Allergies    Codeine    Review of Systems   Review of Systems  Musculoskeletal:  Positive for arthralgias and myalgias.  All other systems reviewed and are negative.   Physical Exam Updated Vital Signs BP 126/86   Pulse 96   Temp 98.3 F (36.8 C)   Resp 18   Ht 5\' 4"  (1.626 m)   Wt 66 kg   SpO2 100%   BMI 24.98 kg/m  Physical Exam Vitals and nursing note reviewed.  Constitutional:      General: He is not in acute distress.    Appearance: Normal appearance. He is normal weight. He is not ill-appearing.     Comments: Resting in bed lying on right side  HENT:     Head: Normocephalic and atraumatic.  Pulmonary:     Effort: Pulmonary effort is normal. No respiratory distress.  Abdominal:     General: Abdomen is flat.  Musculoskeletal:        General: Normal range of motion.     Cervical back: Neck supple.     Comments: Exquisite TTP to left lateral upper  leg without deformity or bruising.  No overlying erythema, fluctuance or induration.  No TTP to L-spine or lumbar musculature.  Hip strength 5 out of 5 in flexion, extension and abduction.  DP pulse 2+ bilaterally.  Sensation intact distally.  Compartments are soft.  Skin:    General: Skin is warm and dry.  Neurological:     Mental Status: He is alert and oriented to person, place, and time.  Psychiatric:        Mood and Affect: Mood normal.        Behavior: Behavior normal.     ED Results / Procedures / Treatments   Labs (all labs ordered are listed, but only abnormal results are displayed) Labs Reviewed - No data to display  EKG None  Radiology MR Lumbar Spine W Wo Contrast (assess for abscess, cord compression) Result Date: 11/20/2023 CLINICAL DATA:  Initial evaluation for acute low back pain,  history of IVDU. EXAM: MRI LUMBAR SPINE WITHOUT AND WITH CONTRAST TECHNIQUE: Multiplanar and multiecho pulse sequences of the lumbar spine were obtained without and with intravenous contrast. CONTRAST:  6mL GADAVIST GADOBUTROL 1 MMOL/ML IV SOLN COMPARISON:  None Available. FINDINGS: Segmentation: Standard. Lowest well-formed disc space labeled the L5-S1 level. Alignment: Physiologic with preservation of the normal lumbar lordosis. No listhesis. Vertebrae: Vertebral body height maintained without acute or chronic fracture. Bone marrow signal intensity within normal limits. No worrisome osseous lesions. No evidence for osteomyelitis discitis or septic arthritis. Conus medullaris and cauda equina: Conus extends to the L1 level. Conus and cauda equina appear normal. Paraspinal and other soft tissues: Unremarkable. Disc levels: No significant disc pathology seen within the lumbar spine. Intervertebral discs are well hydrated with preserved disc height. No significant disc bulge or focal disc herniation. Minimal for age lower lumbar facet hypertrophy. No significant spinal stenosis. Foramina remain patent. No impingement. IMPRESSION: Normal MRI of the lumbar spine. No evidence for acute infection or other abnormality. No significant disc pathology, stenosis, or evidence for neural impingement. Electronically Signed   By: Rise Mu M.D.   On: 11/20/2023 00:57    Procedures Procedures    Medications Ordered in ED Medications  dexamethasone (DECADRON) injection 10 mg (has no administration in time range)  HYDROcodone-acetaminophen (NORCO/VICODIN) 5-325 MG per tablet 1 tablet (has no administration in time range)    ED Course/ Medical Decision Making/ A&P                                 Medical Decision Making This patient presents to the ED for concern of left leg pain, this involves an extensive number of treatment options, and is a complaint that carries with it a high risk of complications and  morbidity.  The differential diagnosis includes fracture, strain, sprain, contusion, myositis, rhabdomyolysis, epidural abscess, cord compression syndrome, meralgia paresthetica  My initial workup includes symptom control  Additional history obtained from: Nursing notes from this visit. Previous records within EMR system ED visits for same on 11/08/2023, 11/09/2023, 11/19/2023.  Had an MRI lumbar spine on 11/19/2023 which was normal  33 year old male presenting to the ED for evaluation of left upper leg pain.  Symptoms have been present for the past month or so.  He has been seen multiple times for this, he has had reassuring workups.  Most recently, had an MRI of the lumbar spine with and without contrast 2 days ago which was negative for cord compression or epidural  abscess.  On exam, there is quite a bit of tenderness to palpation of the left upper outer leg.  No overlying skin changes.  Lower suspicion for infectious etiology.  Suspect musculoskeletal etiology.  Lower suspicion for myositis or rhabdomyolysis given localized nature of the symptoms.  He has been sent prescriptions for medications but has not been able to pick them up due to homelessness.  Will treat for potential neuropathic pain with dose of Decadron in the emergency department.  He was given resource guidance for financial assistance and strongly encouraged to fill his prescriptions.  He was given contact information for community health and wellness and encouraged to call to schedule follow-up appointment.  He was also requesting a taxi voucher and food on my initial examination, there may be a component of malingering.  He was given return precautions.  Stable at discharge.  At this time there does not appear to be any evidence of an acute emergency medical condition and the patient appears stable for discharge with appropriate outpatient follow up. Diagnosis was discussed with patient who verbalizes understanding of care plan and is  agreeable to discharge. I have discussed return precautions with patient who verbalizes understanding. Patient encouraged to follow-up with their PCP within 1 week. All questions answered.  Note: Portions of this report may have been transcribed using voice recognition software. Every effort was made to ensure accuracy; however, inadvertent computerized transcription errors may still be present.        Final Clinical Impression(s) / ED Diagnoses Final diagnoses:  Left leg pain    Rx / DC Orders ED Discharge Orders     None         Mora Bellman 11/21/23 1459    Rondel Baton, MD 11/21/23 2024

## 2023-11-21 NOTE — ED Notes (Signed)
 Discharge paperwork given and verbally understood.

## 2023-11-21 NOTE — BH Assessment (Signed)
 Comprehensive Clinical Assessment (CCA) Note  11/22/2023 Mark Hammond [id number]  Disposition: Sindy Guadeloupe, NP, recommends admission to [facility].   The patient demonstrates the following risk factors for suicide: Chronic risk factors for suicide include: psychiatric disorder of Bipolar, Major Depressive Disorder and Schizophrenia, substance use disorder, previous suicide attempts 12/24 attempted overdose, and previous self-harm cutting left arm and belly on last arm 2 weeks ago . Acute risk factors for suicide include: family or marital conflict, unemployment, social withdrawal/isolation, loss (financial, interpersonal, professional), and recent discharge from inpatient psychiatry. Protective factors for this patient include: responsibility to others (children, family), coping skills, and hope for the future. Considering these factors, the overall suicide risk at this point appears to be high. Patient is not appropriate for outpatient follow up.  Mark Hammond is a 33 year old male presenting as a voluntary walk-in to [facility] due to SI with plan to "jump off bridge or hop in front of car" and substance abuse treatment/detox. Patient has history of Bipolar, MDD, Schizophrenia and intentional overdose in December of 2024. Patient denied HI.   Patient admits to [facility] with plan. Patient reports that he currently uses 2.5 grams of crack cocaine daily and that he 1st used when he was 33 years old. Patient reports that he currently uses 3x 40 ounces daily and that he 1st used when he was 33 years old. Patient reported that he also currently uses "a hit or two" of methamphetamines daily and that he 1st used when he was 33 years old. Patient reports withdrawals of shakes and sweats. Patient reports auditory hallucinations of command voices telling him to "get high". Patient denied visual hallucinations. Patient reported history of cutting himself on the left arm and on his belly 2 weeks ago, stating "it was out of anger, I  hadn't thought it through". Patient reports worsening depressive symptoms. Patient reports 4 hours nightly sleep and poor appetite.   Patient does not have a psychiatrist or therapist. Patient is not prescribed any psych medications. Patient reported last psych hospitalization was Fall 2024 at [facility].   Patient resides with girlfriend and has no kids. Patient is currently unemployed. Patient denied access to guns. Patient was cooperative during assessment. Patient seeking treatment.    Chief Complaint:  Chief Complaint  Patient presents with   Addiction Problem   Suicidal Ideation   Visit Diagnosis:  Polysubstance abuse Alcohol abuse Major depressive disorder    CCA Screening, Triage and Referral (STR)  Patient Reported Information How did you hear about [place]? Self  What Is the Reason for Your Visit/Call Today? Pt presents to [facility] as a voluntary walk-in, unaccompanied with complaint of SI, with no plan or intent, AH and substance abuse treatment/detox. Pt reports using crack, alcohol and meth yesterday. Pt reports using crack and alcohol on a daily basis and only Meth ocassionally. Pt reports experiencing symptoms of shakes and cold sweats at this time. Pt reports history of  Bipolar, MDD, Schizophrenia and intentional overdose in December of 2024. Pt denies taking psychotropic medications at this time. Pt reports experiencing AH while sitting in the lobby, but denies at this moment during triage. Pt reports that he hears muffled sounds, voices telling him he should get high, etc. Pt currently denies HI,VH.  How Long Has This Been Causing You Problems? > than 6 months  What Do You Feel Would Help You the Most Today? Treatment for Depression or other mood problem; Alcohol or Drug Use Treatment   Have You Recently  Had Any Thoughts About Hurting Yourself? Yes  Are You Planning to Commit Suicide/Harm Yourself At This time? No   Flowsheet Row ED from 11/21/2023 in [facility] Most recent reading at 11/22/2023 12:50 AM ED from 11/21/2023 in [facility] Most recent reading at 11/21/2023  7:45 PM ED from 11/21/2023 in [facility] Emergency Department at [facility] Most recent reading at 11/21/2023 11:06 AM  C-SSRS RISK CATEGORY No Risk High Risk No Risk       Have you Recently Had Thoughts About Hurting Someone [name]? No  Are You Planning to Harm Someone at This Time? No  Explanation: n/a   Have You Used Any Alcohol or Drugs in the Past 24 Hours? Yes  How Long Ago Did You Use Drugs or Alcohol? No data recorded What Did You Use and How Much? THC, crack (2 1/2 grams) alcohol (3-40oz beers, fifth of liquor and 16oz of Mike's Hard Lemonade)   Do You Currently Have a Therapist/Psychiatrist? No  Name of Therapist/Psychiatrist:    Have You Been Recently Discharged From Any Office Practice or Programs? No  Explanation of Discharge From Practice/Program: n/a     CCA Screening Triage Referral Assessment Type of Contact: Face-to-Face  Telemedicine Service Delivery:   Is this Initial or Reassessment?   Date Telepsych consult ordered in [facility]:    Time Telepsych consult ordered in [facility]:    Location of Assessment: [facility] Assessment Services  Provider Location: [facility] Assessment Services   Collateral Involvement: none   Does Patient Have a Automotive engineer Guardian? No  Legal Guardian Contact Information: n/a  Copy of Legal Guardianship Form: -- (n/a)  Legal Guardian Notified of Arrival: -- (n/a)  Legal Guardian Notified of Pending Discharge: -- (n/a)  If Minor and Not Living with Parent(s), Who has Custody? n/a  Is CPS involved or ever been involved? Never  Is APS involved or ever been involved? Never   Patient Determined To Be At Risk for Harm To Self or Others Based on Review of Patient Reported Information or Presenting Complaint? Yes, for Self-Harm  Method: Plan with  intent and identified person  Availability of Means: In hand or used  Intent: Clearly intends on inflicting harm that could cause death  Notification Required: No need or identified person  Additional Information for Danger to Others Potential: -- (n/a)  Additional Comments for Danger to Others Potential: n/a  Are There Guns or Other Weapons in Your Home? Yes  Types of Guns/Weapons: n/a  Are These Weapons Safely Secured?                            -- (n/a)  Who Could Verify You Are Able To Have These Secured: n/a  Do You Have any Outstanding Charges, Pending Court Dates, Parole/Probation? none reported  Contacted To Inform of Risk of Harm To Self or Others: Other: Comment    Does Patient Present under Involuntary Commitment? No    [place] of Residence: [place]   Patient Currently Receiving the Following Services: Medication Management; Individual Therapy; SAIOP (Substance Abuse Intensive Outpatient Program   Determination of Need: Urgent (48 hours)   Options For Referral: Other: Comment; BH Urgent Care; Facility-Based Crisis; Chemical Dependency Intensive Outpatient Therapy (CDIOP); Outpatient Therapy     CCA Biopsychosocial Patient Reported Schizophrenia/Schizoaffective Diagnosis in Past: No   Strengths: Pt is motivated to get treatment   Mental Health Symptoms Depression:  Difficulty Concentrating; Change in energy/activity; Hopelessness; Sleep (too much or little); Irritability   Duration of Depressive symptoms:    Mania:  Racing thoughts; Recklessness   Anxiety:   Difficulty concentrating; Sleep; Tension; Worrying; Restlessness   Psychosis:  Hallucinations   Duration of Psychotic symptoms: Duration of Psychotic Symptoms: Less than six months   Trauma:  N/A   Obsessions:  N/A   Compulsions:  None   Inattention:  None   Hyperactivity/Impulsivity:  Fidgets with hands/feet; Feeling of restlessness   Oppositional/Defiant Behaviors:  None    Emotional Irregularity:  Chronic feelings of emptiness; Transient, stress-related paranoia/disassociation   Other Mood/Personality Symptoms:  None Reported    Mental Status Exam Appearance and self-care  Stature:  Small   Weight:  Average weight   Clothing:  Casual (Scrubs)   Grooming:  Normal   Cosmetic use:  None   Posture/gait:  Normal   Motor activity:  Not Remarkable   Sensorium  Attention:  Distractible   Concentration:  Normal   Orientation:  X5   Recall/memory:  Normal   Affect and Mood  Affect:  Appropriate; Depressed; Anxious   Mood:  Depressed; Anxious   Relating  Eye contact:  Normal   Facial expression:  Anxious; Responsive   Attitude toward examiner:  Cooperative   Thought and Language  Speech flow: Normal   Thought content:  Appropriate to Mood and Circumstances   Preoccupation:  None   Hallucinations:  Auditory; Command (Comment); Visual (Voices telling him to get high.)   Organization:  Patent examiner of Knowledge:  Average   Intelligence:  Average   Abstraction:  Normal   Judgement:  Impaired   Reality Testing:  Adequate   Insight:  Fair   Decision Making:  Impulsive   Social Functioning  Social Maturity:  Impulsive   Social Judgement:  Heedless; Impropriety   Stress  Stressors:  Housing; Surveyor, quantity; Relationship   Coping Ability:  Exhausted; Deficient supports; Overwhelmed   Skill Deficits:  None   Supports:  Family; Support needed     Religion: Religion/Spirituality Are You A Religious Person?: Yes How Might This Affect Treatment?: Will not affect treatment  Leisure/Recreation: Leisure / Recreation Do You Have Hobbies?: Yes Leisure and Hobbies: video games, listening to music and watching movies  Exercise/Diet: Exercise/Diet Do You Exercise?: No Have You Gained or Lost A Significant Amount of Weight in the Past Six Months?: No Do You Follow a Special Diet?: No Do You Have Any  Trouble Sleeping?: Yes Explanation of Sleeping Difficulties: 4 hours nightly   CCA Employment/Education Employment/Work Situation: Employment / Work Situation Employment Situation: Unemployed Patient's Job has Been Impacted by Current Illness: No Has Patient ever Been in Equities trader?: No  Education: Education Is Patient Currently Attending School?: No Last Grade Completed: 10 Did You Product manager?: No Did You Have An Individualized Education Program (IIEP): No Did You Have Any Difficulty At School?: No Patient's Education Has Been Impacted by Current Illness: No   CCA Family/Childhood History Family and Relationship History: Family history Marital status: Single Does patient have children?: No  Childhood History:  Childhood History By whom was/is the patient raised?: Grandparents Did patient suffer any verbal/emotional/physical/sexual abuse as a child?: No Did patient suffer from severe childhood neglect?: No Has patient ever been sexually abused/assaulted/raped as an adolescent or adult?: No Was the patient ever a victim of a crime or a disaster?: No Witnessed domestic violence?: No Has patient been affected by domestic  violence as an adult?: No       CCA Substance Use Alcohol/Drug Use: Alcohol / Drug Use Pain Medications: None Prescriptions: None Over the Counter: Tylenol, benedryl History of alcohol / drug use?: Yes Longest period of sobriety (when/how long): Unable to Quantify Negative Consequences of Use: Financial, Legal, Personal relationships, Work / School Withdrawal Symptoms: Irritability Substance #1 Name of Substance 1: crack cocaine 1 - Age of First Use: 15 1 - Amount (size/oz): 2.5 grams 1 - Frequency: daily 1 - Method of Aquiring: unable to assess Substance #2 Name of Substance 2: alcohol 2 - Age of First Use: 15 2 - Amount (size/oz): 3x  40 ounces 2 - Frequency: daily 2 - Method of Aquiring: unable to assess Substance #3 Name of  Substance 3: meth 3 - Age of First Use: 15 3 - Amount (size/oz): "hit or two" 3 - Frequency: daily 3 - Method of Aquiring: unable to assess                   ASAM's:  Six Dimensions of Multidimensional Assessment  Dimension 1:  Acute Intoxication and/or Withdrawal Potential:   Dimension 1:  Description of individual's past and current experiences of substance use and withdrawal: Ongoing usage.  Dimension 2:  Biomedical Conditions and Complications:   Dimension 2:  Description of patient's biomedical conditions and  complications: None reported.  Dimension 3:  Emotional, Behavioral, or Cognitive Conditions and Complications:  Dimension 3:  Description of emotional, behavioral, or cognitive conditions and complications: SI with plan and worsening depression.  Dimension 4:  Readiness to Change:  Dimension 4:  Description of Readiness to Change criteria: Patient is seeking treatment.  Dimension 5:  Relapse, Continued use, or Continued Problem Potential:  Dimension 5:  Relapse, continued use, or continued problem potential critiera description: Continued usage.  Dimension 6:  Recovery/Living Environment:  Dimension 6:  Recovery/Iiving environment criteria description: Supported, lives with girlfriend.  ASAM Severity Score: ASAM's Severity Rating Score: 10  ASAM Recommended Level of Treatment: ASAM Recommended Level of Treatment: Level III Residential Treatment   Substance use Disorder (SUD) Substance Use Disorder (SUD)  Checklist Symptoms of Substance Use: Continued use despite having a persistent/recurrent physical/psychological problem caused/exacerbated by use, Continued use despite persistent or recurrent social, interpersonal problems, caused or exacerbated by use, Substance(s) often taken in larger amounts or over longer times than was intended, Social, occupational, recreational activities given up or reduced due to use, Presence of craving or strong urge to use, Persistent desire or  unsuccessful efforts to cut down or control use, Evidence of tolerance  Recommendations for Services/Supports/Treatments: Recommendations for Services/Supports/Treatments Recommendations For Services/Supports/Treatments: Medication Management, Inpatient Hospitalization, Individual Therapy, Other (Comment) (Patient declined outpatient therapy at this time)  Disposition Recommendation per psychiatric provider:  Recommends admission to [facility].   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

## 2023-11-21 NOTE — ED Provider Notes (Signed)
 Facility Based Crisis Admission H&P  Date: 11/22/23 Patient Name: Mark Hammond MRN: [id number] Chief Complaint: need detox from alcohol and cocaine  Diagnoses:  Final diagnoses:  Alcohol abuse  Polysubstance abuse (HCC)  Schizoaffective disorder, bipolar type (HCC)  H/O medication noncompliance    HPI: Mark Hammond, 32 y/o male, with a history of MDD, schizoaffective disorder, polysubstance abuse, alcohol intoxication, intentional overdose.  Presented to [facility] voluntarily.  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  According to the patient he consume 3, 40 ounce beer and other liquor.  Patient reports he also used 2-1/2 g of cocaine has been using daily.  Patient stated he is unemployed, lives with his girlfriend.  Patient stated he was last hospitalized at [facility] for alcohol abuse and cocaine.  I review of patient records show multiple visits to different locations for the same issues.  Face-to-face observation of patient, patient is alert and oriented x 4, speech is clear, maintain eye contact.  Patient is a bit disheveled, very nonchalant when talking.  Patient denies SI, HI, AVH or paranoia at this time.  Patient reports using alcohol and cocaine and methamphetamines.  Patient denies access to guns denied wanting to hurt himself or others.  At this time patient does not seem to be influenced by internal stimuli.  Does not show any sign of distress at this time.  Writer discussed with patient the need for compliance with the detox program patient in agreement.  PHQ-9 was completed patient scored a 22 severe depression  Recommend FBC admission.  PHQ 2-9:  Flowsheet Row ED from 11/21/2023 in [facility]  Thoughts that you would be better off dead, or of hurting yourself in some way Nearly every day  PHQ-9 Total Score 22       Flowsheet Row ED from 11/21/2023 in [facility] Most recent reading at 11/22/2023 12:50 AM ED from 11/21/2023 in [facility] Most recent reading at 11/21/2023  7:45 PM ED from 11/21/2023 in [facility] Emergency Department at [facility] Most recent reading at 11/21/2023 11:06 AM  C-SSRS RISK CATEGORY No Risk High Risk No Risk         Total Time spent with patient: 30 minutes  Musculoskeletal  Strength & Muscle Tone: within normal limits Gait & Station: normal Patient leans: N/A  Psychiatric Specialty Exam  Presentation General Appearance:  Disheveled  Eye Contact: Good  Speech: Clear and Coherent  Speech Volume: Normal  Handedness: Right   Mood and Affect  Mood: Anxious  Affect: Congruent   Thought Process  Thought Processes: Coherent  Descriptions of Associations:Intact  Orientation:Full (Time, Place and Person)  Thought Content:Logical  Diagnosis of Schizophrenia or Schizoaffective disorder in past: No  Duration of Psychotic Symptoms: Less than six months  Hallucinations:Hallucinations: None  Ideas of Reference:None  Suicidal Thoughts:Suicidal Thoughts: No  Homicidal Thoughts:Homicidal Thoughts: No   Sensorium  Memory: Immediate Fair  Judgment: Poor  Insight: Fair   Chartered certified accountant: Fair  Attention Span: Fair  Recall: Good  Fund of Knowledge: Good  Language: Good   Psychomotor Activity  Psychomotor Activity: Psychomotor Activity: Normal   Assets  Assets: Desire for Improvement; Resilience; Vocational/Educational   Sleep  Sleep: Sleep: Fair Number of Hours of Sleep: 6   Nutritional Assessment (For OBS and FBC admissions only) Has the patient had a weight loss or gain of 10 pounds or  more in the last 3 months?: No Has the patient had a decrease in food intake/or appetite?: No Does the patient have dental problems?: No Does the patient have eating habits or behaviors that may be indicators of an  eating disorder including binging or inducing vomiting?: No Has the patient recently lost weight without trying?: 0 Has the patient been eating poorly because of a decreased appetite?: 0 Malnutrition Screening Tool Score: 0    Physical Exam HENT:     Head: Normocephalic.     Nose: Nose normal.  Eyes:     Pupils: Pupils are equal, round, and reactive to light.  Cardiovascular:     Rate and Rhythm: Normal rate.  Pulmonary:     Effort: Respiratory distress present.  Musculoskeletal:        General: Normal range of motion.     Cervical back: Normal range of motion.  Skin:    General: Skin is warm.  Neurological:     General: No focal deficit present.     Mental Status: He is alert.  Psychiatric:        Mood and Affect: Mood normal.        Behavior: Behavior normal.        Thought Content: Thought content normal.        Judgment: Judgment normal.    Review of Systems  Constitutional: Negative.   HENT: Negative.    Eyes: Negative.   Respiratory: Negative.    Cardiovascular: Negative.   Gastrointestinal: Negative.   Genitourinary: Negative.   Musculoskeletal: Negative.   Skin: Negative.   Neurological: Negative.   Psychiatric/Behavioral:  Positive for depression and substance abuse. The patient is nervous/anxious.     Blood pressure 127/89, pulse 72, temperature 98 F (36.7 C), resp. rate 18, SpO2 100%. There is no height or weight on file to calculate BMI.  Past Psychiatric History: polysubstance abuse, alcohol abuse, schizoaffective dis.    Is the patient at risk to self? No  Has the patient been a risk to self in the past 6 months? Yes .    Has the patient been a risk to self within the distant past? Yes   Is the patient a risk to others? No   Has the patient been a risk to others in the past 6 months? No   Has the patient been a risk to others within the distant past? No   Past Medical History: see chart Family History: unknown  Social History: cocaine,  meth  Last Labs:  Admission on 11/21/2023  Component Date Value Ref Range Status   WBC 11/21/2023 5.6  4.0 - 10.5 K/uL Final   RBC 11/21/2023 4.14 (L)  4.22 - 5.81 MIL/uL Final   Hemoglobin 11/21/2023 13.1  13.0 - 17.0 g/dL Final   HCT 02/72/5366 38.8 (L)  39.0 - 52.0 % Final   MCV 11/21/2023 93.7  80.0 - 100.0 fL Final   MCH 11/21/2023 31.6  26.0 - 34.0 pg Final   MCHC 11/21/2023 33.8  30.0 - 36.0 g/dL Final   RDW 44/12/4740 13.2  11.5 - 15.5 % Final   Platelets 11/21/2023 298  150 - 400 K/uL Final   nRBC 11/21/2023 0.0  0.0 - 0.2 % Final   Neutrophils Relative % 11/21/2023 83  % Final   Neutro Abs 11/21/2023 4.6  1.7 - 7.7 K/uL Final   Lymphocytes Relative 11/21/2023 14  % Final   Lymphs Abs 11/21/2023 0.8  0.7 - 4.0 K/uL Final  Monocytes Relative 11/21/2023 3  % Final   Monocytes Absolute 11/21/2023 0.2  0.1 - 1.0 K/uL Final   Eosinophils Relative 11/21/2023 0  % Final   Eosinophils Absolute 11/21/2023 0.0  0.0 - 0.5 K/uL Final   Basophils Relative 11/21/2023 0  % Final   Basophils Absolute 11/21/2023 0.0  0.0 - 0.1 K/uL Final   Immature Granulocytes 11/21/2023 0  % Final   Abs Immature Granulocytes 11/21/2023 0.01  0.00 - 0.07 K/uL Final   Performed at [facility], [address], [place], [place] [address]   Sodium 11/21/2023 135  135 - 145 mmol/L Final   Potassium 11/21/2023 4.2  3.5 - 5.1 mmol/L Final   Chloride 11/21/2023 99  98 - 111 mmol/L Final   CO2 11/21/2023 26  22 - 32 mmol/L Final   Glucose, Bld 11/21/2023 162 (H)  70 - 99 mg/dL Final   Glucose reference range applies only to samples taken after fasting for at least 8 hours.   BUN 11/21/2023 7  6 - 20 mg/dL Final   Creatinine, Ser 11/21/2023 0.63  0.61 - 1.24 mg/dL Final   Calcium 60/45/4098 8.9  8.9 - 10.3 mg/dL Final   Total Protein 11/91/4782 8.1  6.5 - 8.1 g/dL Final   Albumin 95/62/1308 2.8 (L)  3.5 - 5.0 g/dL Final   AST 65/78/4696 91 (H)  15 - 41 U/L Final   ALT 11/21/2023 55 (H)  0 - 44 U/L  Final   Alkaline Phosphatase 11/21/2023 87  38 - 126 U/L Final   Total Bilirubin 11/21/2023 0.8  0.0 - 1.2 mg/dL Final   GFR, Estimated 11/21/2023 >60  >60 mL/min Final   Comment: (NOTE) Calculated using the CKD-EPI Creatinine Equation (2021)    Anion gap 11/21/2023 10  5 - 15 Final   Performed at [facility], [address] [place], [place] [address]   Alcohol, Ethyl (B) 11/21/2023 <10  <10 mg/dL Final   Comment: (NOTE) Lowest detectable limit for serum alcohol is 10 mg/dL.  For medical purposes only. Performed at [facility], [address], [place], [place] [address]    TSH 11/21/2023 0.365  0.350 - 4.500 uIU/mL Final   Comment: Performed by a 3rd Generation assay with a functional sensitivity of <=0.01 uIU/mL. Performed at [facility], [address] [place], [place] [address]   Admission on 11/19/2023, Discharged on 11/20/2023  Component Date Value Ref Range Status   Sodium 11/19/2023 138  135 - 145 mmol/L Final   Potassium 11/19/2023 3.4 (L)  3.5 - 5.1 mmol/L Final   Chloride 11/19/2023 103  98 - 111 mmol/L Final   CO2 11/19/2023 26  22 - 32 mmol/L Final   Glucose, Bld 11/19/2023 123 (H)  70 - 99 mg/dL Final   Glucose reference range applies only to samples taken after fasting for at least 8 hours.   BUN 11/19/2023 <5 (L)  6 - 20 mg/dL Final   Creatinine, Ser 11/19/2023 0.50 (L)  0.61 - 1.24 mg/dL Final   Calcium 72/53/6644 8.2 (L)  8.9 - 10.3 mg/dL Final   GFR, Estimated 11/19/2023 >60  >60 mL/min Final   Comment: (NOTE) Calculated using the CKD-EPI Creatinine Equation (2021)    Anion gap 11/19/2023 9  5 - 15 Final   Performed at [facility], [address] [place], [place] [address]   WBC 11/19/2023 8.4  4.0 - 10.5 K/uL Final   RBC 11/19/2023  4.00 (L)  4.22 - 5.81 MIL/uL Final   Hemoglobin 11/19/2023 12.6 (L)  13.0 - 17.0 g/dL Final   HCT 16/07/9603 37.7 (L)  39.0 - 52.0 % Final   MCV 11/19/2023 94.3  80.0 - 100.0 fL Final    MCH 11/19/2023 31.5  26.0 - 34.0 pg Final   MCHC 11/19/2023 33.4  30.0 - 36.0 g/dL Final   RDW 54/06/8118 13.2  11.5 - 15.5 % Final   Platelets 11/19/2023 267  150 - 400 K/uL Final   nRBC 11/19/2023 0.0  0.0 - 0.2 % Final   Neutrophils Relative % 11/19/2023 65  % Final   Neutro Abs 11/19/2023 5.5  1.7 - 7.7 K/uL Final   Lymphocytes Relative 11/19/2023 22  % Final   Lymphs Abs 11/19/2023 1.9  0.7 - 4.0 K/uL Final   Monocytes Relative 11/19/2023 11  % Final   Monocytes Absolute 11/19/2023 0.9  0.1 - 1.0 K/uL Final   Eosinophils Relative 11/19/2023 1  % Final   Eosinophils Absolute 11/19/2023 0.1  0.0 - 0.5 K/uL Final   Basophils Relative 11/19/2023 1  % Final   Basophils Absolute 11/19/2023 0.1  0.0 - 0.1 K/uL Final   Immature Granulocytes 11/19/2023 0  % Final   Abs Immature Granulocytes 11/19/2023 0.03  0.00 - 0.07 K/uL Final   Performed at [facility], [address] [place], [place] [address]   Sed Rate 11/19/2023 77 (H)  0 - 16 mm/hr Final   Performed at [facility], [address], [place], [place] [address]   CRP 11/19/2023 4.2 (H)  <1.0 mg/dL Final   Performed at [facility], [address] [place], [place] [address]   Sodium 11/19/2023 141  135 - 145 mmol/L Final   Potassium 11/19/2023 3.5  3.5 - 5.1 mmol/L Final   Chloride 11/19/2023 101  98 - 111 mmol/L Final   BUN 11/19/2023 <3 (L)  6 - 20 mg/dL Final   Creatinine, Ser 11/19/2023 0.70  0.61 - 1.24 mg/dL Final   Glucose, Bld 78/46/9629 126 (H)  70 - 99 mg/dL Final   Glucose reference range applies only to samples taken after fasting for at least 8 hours.   Calcium, Mark 11/19/2023 1.05 (L)  1.15 - 1.40 mmol/L Final   TCO2 11/19/2023 27  22 - 32 mmol/L Final   Hemoglobin 11/19/2023 12.9 (L)  13.0 - 17.0 g/dL Final   HCT 52/84/1324 38.0 (L)  39.0 - 52.0 % Final   SARS Coronavirus 2 by RT PCR 11/19/2023 NEGATIVE  NEGATIVE Final   Comment: (NOTE) SARS-CoV-2 target nucleic  acids are NOT DETECTED.  The SARS-CoV-2 RNA is generally detectable in upper respiratory specimens during the acute phase of infection. The lowest concentration of SARS-CoV-2 viral copies this assay can detect is 138 copies/mL. A negative result does not preclude SARS-Cov-2 infection and should not be used as the sole basis for treatment or other patient management decisions. A negative result may occur with  improper specimen collection/handling, submission of specimen other than nasopharyngeal swab, presence of viral mutation(s) within the areas targeted by this assay, and inadequate number of viral copies(<138 copies/mL). A negative result must be combined with clinical observations, patient history, and epidemiological information. The expected result is Negative.  Fact Sheet for Patients:  BloggerCourse.com  Fact Sheet for Healthcare Providers:  SeriousBroker.it  This test is no  t yet approved or cleared by the [place] and  has been authorized for detection and/or diagnosis of SARS-CoV-2 by FDA under an Emergency Use Authorization (EUA). This EUA will remain  in effect (meaning this test can be used) for the duration of the COVID-19 declaration under Section 564(b)(1) of the Act, 21 U.S.C.section 360bbb-3(b)(1), unless the authorization is terminated  or revoked sooner.       Influenza A by PCR 11/19/2023 NEGATIVE  NEGATIVE Final   Influenza B by PCR 11/19/2023 NEGATIVE  NEGATIVE Final   Comment: (NOTE) The Xpert Xpress SARS-CoV-2/FLU/RSV plus assay is intended as an aid in the diagnosis of influenza from Nasopharyngeal swab specimens and should not be used as a sole basis for treatment. Nasal washings and aspirates are unacceptable for Xpert Xpress SARS-CoV-2/FLU/RSV testing.  Fact Sheet for Patients: BloggerCourse.com  Fact Sheet for Healthcare  Providers: SeriousBroker.it  This test is not yet approved or cleared by the [place] FDA and has been authorized for detection and/or diagnosis of SARS-CoV-2 by FDA under an Emergency Use Authorization (EUA). This EUA will remain in effect (meaning this test can be used) for the duration of the COVID-19 declaration under Section 564(b)(1) of the Act, 21 U.S.C. section 360bbb-3(b)(1), unless the authorization is terminated or revoked.     Resp Syncytial Virus by PCR 11/19/2023 NEGATIVE  NEGATIVE Final   Comment: (NOTE) Fact Sheet for Patients: BloggerCourse.com  Fact Sheet for Healthcare Providers: SeriousBroker.it  This test is not yet approved or cleared by the [place] FDA and has been authorized for detection and/or diagnosis of SARS-CoV-2 by FDA under an Emergency Use Authorization (EUA). This EUA will remain in effect (meaning this test can be used) for the duration of the COVID-19 declaration under Section 564(b)(1) of the Act, 21 U.S.C. section 360bbb-3(b)(1), unless the authorization is terminated or revoked.  Performed at [facility], [address], [place], [place] [address]   Admission on 11/09/2023, Discharged on 11/09/2023  Component Date Value Ref Range Status   Color, Urine 11/09/2023 AMBER (A)  YELLOW Final   BIOCHEMICALS MAY BE AFFECTED BY COLOR   APPearance 11/09/2023 CLEAR  CLEAR Final   Specific Gravity, Urine 11/09/2023 1.036 (H)  1.005 - 1.030 Final   pH 11/09/2023 5.0  5.0 - 8.0 Final   Glucose, UA 11/09/2023 NEGATIVE  NEGATIVE mg/dL Final   Hgb urine dipstick 11/09/2023 NEGATIVE  NEGATIVE Final   Bilirubin Urine 11/09/2023 SMALL (A)  NEGATIVE Final   Ketones, ur 11/09/2023 NEGATIVE  NEGATIVE mg/dL Final   Protein, ur 60/45/4098 30 (A)  NEGATIVE mg/dL Final   Nitrite 11/91/4782 NEGATIVE  NEGATIVE Final   Leukocytes,Ua 11/09/2023 NEGATIVE   NEGATIVE Final   RBC / HPF 11/09/2023 0-5  0 - 5 RBC/hpf Final   WBC, UA 11/09/2023 0-5  0 - 5 WBC/hpf Final   Bacteria, UA 11/09/2023 NONE SEEN  NONE SEEN Final   Squamous Epithelial / HPF 11/09/2023 0-5  0 - 5 /HPF Final   Mucus 11/09/2023 PRESENT   Final   Performed at [facility], [address], [place], [place] [address]   Opiates 11/09/2023 POSITIVE (A)  NONE DETECTED Final   Cocaine 11/09/2023 POSITIVE (A)  NONE DETECTED Final   Benzodiazepines 11/09/2023 POSITIVE (A)  NONE DETECTED Final   Amphetamines 11/09/2023 NONE DETECTED  NONE DETECTED Final   Tetrahydrocannabinol 11/09/2023 POSITIVE (A)  NONE DETECTED Final   Barbiturates 11/09/2023 NONE DETECTED  NONE DETECTED Final  Comment: (NOTE) DRUG SCREEN FOR MEDICAL PURPOSES ONLY.  IF CONFIRMATION IS NEEDED FOR ANY PURPOSE, NOTIFY LAB WITHIN 5 DAYS.  LOWEST DETECTABLE LIMITS FOR URINE DRUG SCREEN Drug Class                     Cutoff (ng/mL) Amphetamine and metabolites    1000 Barbiturate and metabolites    200 Benzodiazepine                 200 Opiates and metabolites        300 Cocaine and metabolites        300 THC                            50 Performed at [facility], [address] [place], [place] [address]   Admission on 08/10/2023, Discharged on 08/14/2023  Component Date Value Ref Range Status   Sodium 08/10/2023 137  135 - 145 mmol/L Final   Potassium 08/10/2023 3.4 (L)  3.5 - 5.1 mmol/L Final   Chloride 08/10/2023 100  98 - 111 mmol/L Final   CO2 08/10/2023 25  22 - 32 mmol/L Final   Glucose, Bld 08/10/2023 109 (H)  70 - 99 mg/dL Final   Glucose reference range applies only to samples taken after fasting for at least 8 hours.   BUN 08/10/2023 11  6 - 20 mg/dL Final   Creatinine, Ser 08/10/2023 0.71  0.61 - 1.24 mg/dL Final   Calcium 60/45/4098 8.8 (L)  8.9 - 10.3 mg/dL Final   Total Protein 11/91/4782 7.9  6.5 - 8.1 g/dL Final   Albumin 95/62/1308 3.8  3.5 - 5.0  g/dL Final   AST 65/78/4696 466 (H)  15 - 41 U/L Final   ALT 08/10/2023 264 (H)  0 - 44 U/L Final   Alkaline Phosphatase 08/10/2023 112  38 - 126 U/L Final   Total Bilirubin 08/10/2023 1.2 (H)  <1.2 mg/dL Final   GFR, Estimated 08/10/2023 >60  >60 mL/min Final   Comment: (NOTE) Calculated using the CKD-EPI Creatinine Equation (2021)    Anion gap 08/10/2023 12  5 - 15 Final   Performed at [facility], [address] [place], [place] [address]   Alcohol, Ethyl (B) 08/10/2023 144 (H)  <10 mg/dL Final   Comment: (NOTE) Lowest detectable limit for serum alcohol is 10 mg/dL.  For medical purposes only. Performed at [facility], [address] [place], [place] [address]    Salicylate Lvl 08/10/2023 <7.0 (L)  7.0 - 30.0 mg/dL Final   Performed at [facility], [address], [place], [place] [address]   Acetaminophen (Tylenol), Serum 08/10/2023 <10 (L)  10 - 30 ug/mL Final   Comment: (NOTE) Therapeutic concentrations vary significantly. A range of 10-30 ug/mL  may be an effective concentration for many patients. However, some  are best treated at concentrations outside of this range. Acetaminophen concentrations >150 ug/mL at 4 hours after ingestion  and >50 ug/mL at 12 hours after ingestion are often associated with  toxic reactions.  Performed at [facility], [address], [place], [place] [address]    WBC 08/10/2023 8.0  4.0 - 10.5 K/uL Final   RBC 08/10/2023 4.13 (L)  4.22 - 5.81 MIL/uL Final   Hemoglobin 08/10/2023 13.8  13.0 - 17.0 g/dL Final   HCT 64/40/3474 39.4  39.0 - 52.0 % Final  MCV 08/10/2023 95.4  80.0 - 100.0 fL Final   MCH 08/10/2023 33.4  26.0 - 34.0 pg Final   MCHC 08/10/2023 35.0  30.0 - 36.0 g/dL Final   RDW 16/07/9603 13.1  11.5 - 15.5 % Final   Platelets 08/10/2023 226  150 - 400 K/uL Final   nRBC 08/10/2023 0.0  0.0 - 0.2 % Final   Performed at [facility], [address], [place], [place] [address]   Opiates 08/10/2023 NONE DETECTED  NONE DETECTED Final   Cocaine 08/10/2023 POSITIVE (A)  NONE DETECTED Final   Benzodiazepines 08/10/2023 NONE DETECTED  NONE DETECTED Final   Amphetamines 08/10/2023 POSITIVE (A)  NONE DETECTED Final   Tetrahydrocannabinol 08/10/2023 POSITIVE (A)  NONE DETECTED Final   Barbiturates 08/10/2023 NONE DETECTED  NONE DETECTED Final   Comment: (NOTE) DRUG SCREEN FOR MEDICAL PURPOSES ONLY.  IF CONFIRMATION IS NEEDED FOR ANY PURPOSE, NOTIFY LAB WITHIN 5 DAYS.  LOWEST DETECTABLE LIMITS FOR URINE DRUG SCREEN Drug Class                     Cutoff (ng/mL) Amphetamine and metabolites    1000 Barbiturate and metabolites    200 Benzodiazepine                 200 Opiates and metabolites        300 Cocaine and metabolites        300 THC                            50 Performed at [facility], [address] [place], [place] [address]    Total CK 08/10/2023 461 (H)  49 - 397 U/L Final   Performed at [facility], [address] [place], [place] [address]   Hepatitis B Surface Ag 08/11/2023 NON REACTIVE  NON REACTIVE Final   HCV Ab 08/11/2023 Reactive (A)  NON REACTIVE Final   Comment: (NOTE) The CDC recommends that a Reactive HCV antibody result be followed up  with a HCV Nucleic Acid Amplification test.     Hep A IgM 08/11/2023 NON REACTIVE  NON REACTIVE Final   Hep B C IgM 08/11/2023 NON REACTIVE  NON REACTIVE Final   Performed at [facility], [address] [place], [place] [address]   Total Protein 08/12/2023 7.4  6.5 - 8.1 g/dL Final   Albumin 86/57/8469 3.2 (L)  3.5 - 5.0 g/dL Final   AST 62/95/2841 381 (H)  15 - 41 U/L Final   ALT 08/12/2023 247 (H)  0 - 44 U/L Final   Alkaline Phosphatase 08/12/2023 97  38 - 126 U/L Final   Total Bilirubin 08/12/2023 1.0  <1.2 mg/dL Final   Bilirubin, Direct 08/12/2023 0.2  0.0 - 0.2 mg/dL Final   Indirect  Bilirubin 08/12/2023 0.8  0.3 - 0.9 mg/dL Final   Performed at [facility], [address] [place], [place] [address]   Total CK 08/12/2023 41 (L)  49 - 397 U/L Final   Performed at [facility], [address], [place], [place] [address]   Ammonia 08/12/2023 48 (H)  9 - 35 umol/L Final   Performed at [facility], [address], [place], [place] [address]  Admission on 08/04/2023, Discharged on 08/07/2023  Component Date Value Ref Range Status   Amphetamines, Urine 08/04/2023 Negative  Cutoff=1000 ng/mL Final  Amphetamine test includes Amphetamine and Methamphetamine.   Barbiturate, Ur 08/04/2023 Negative  Cutoff=300 ng/mL Final   Benzodiazepine Quant, Ur 08/04/2023 Negative  Cutoff=300 ng/mL Final   Cannabinoid Quant, Ur 08/04/2023 Negative  Cutoff=50 ng/mL Final   Cocaine (Metab.) 08/04/2023 See Final Results  Cutoff=300 ng/mL Final   Opiate Quant, Ur 08/04/2023 Negative  Cutoff=300 ng/mL Final   Opiate test includes Codeine and Morphine only.   Phencyclidine, Ur 08/04/2023 Negative  Cutoff=25 ng/mL Final   Methadone Screen, Urine 08/04/2023 Negative  Cutoff=300 ng/mL Final   Propoxyphene, Urine 08/04/2023 Negative  Cutoff=300 ng/mL Final   Ethanol U, Cloretta Ned 08/04/2023 See Final Results  Cutoff=0.020 % Final   Comment: (NOTE) Performed At: [facility] [address] [place], [place] [id number] Avis Epley PhD NW:[id number]    WBC 08/04/2023 5.8  4.0 - 10.5 K/uL Final   RBC 08/04/2023 3.93 (L)  4.22 - 5.81 MIL/uL Final   Hemoglobin 08/04/2023 12.9 (L)  13.0 - 17.0 g/dL Final   HCT 57/84/6962 36.4 (L)  39.0 - 52.0 % Final   MCV 08/04/2023 92.6  80.0 - 100.0 fL Final   MCH 08/04/2023 32.8  26.0 - 34.0 pg Final   MCHC 08/04/2023 35.4  30.0 - 36.0 g/dL Final   RDW 95/28/4132 12.7  11.5 - 15.5 % Final   Platelets 08/04/2023 147 (L)  150 - 400 K/uL Final   Comment: SPECIMEN CHECKED FOR CLOTS REPEATED TO VERIFY PLATELET  COUNT CONFIRMED BY SMEAR    nRBC 08/04/2023 0.0  0.0 - 0.2 % Final   Neutrophils Relative % 08/04/2023 76  % Final   Neutro Abs 08/04/2023 4.4  1.7 - 7.7 K/uL Final   Lymphocytes Relative 08/04/2023 16  % Final   Lymphs Abs 08/04/2023 0.9  0.7 - 4.0 K/uL Final   Monocytes Relative 08/04/2023 6  % Final   Monocytes Absolute 08/04/2023 0.3  0.1 - 1.0 K/uL Final   Eosinophils Relative 08/04/2023 0  % Final   Eosinophils Absolute 08/04/2023 0.0  0.0 - 0.5 K/uL Final   Basophils Relative 08/04/2023 1  % Final   Basophils Absolute 08/04/2023 0.0  0.0 - 0.1 K/uL Final   Immature Granulocytes 08/04/2023 1  % Final   Abs Immature Granulocytes 08/04/2023 0.04  0.00 - 0.07 K/uL Final   Performed at [facility], [address] [place], [place] [address]   Sodium 08/04/2023 134 (L)  135 - 145 mmol/L Final   Potassium 08/04/2023 3.0 (L)  3.5 - 5.1 mmol/L Final   Chloride 08/04/2023 95 (L)  98 - 111 mmol/L Final   CO2 08/04/2023 24  22 - 32 mmol/L Final   Glucose, Bld 08/04/2023 115 (H)  70 - 99 mg/dL Final   Glucose reference range applies only to samples taken after fasting for at least 8 hours.   BUN 08/04/2023 5 (L)  6 - 20 mg/dL Final   Creatinine, Ser 08/04/2023 0.56 (L)  0.61 - 1.24 mg/dL Final   Calcium 27/25/3664 8.2 (L)  8.9 - 10.3 mg/dL Final   Total Protein 40/34/7425 7.7  6.5 - 8.1 g/dL Final   Albumin 95/63/8756 3.6  3.5 - 5.0 g/dL Final   AST 43/32/9518 205 (H)  15 - 41 U/L Final   ALT 08/04/2023 99 (H)  0 - 44 U/L Final   Alkaline Phosphatase 08/04/2023 98  38 - 126 U/L Final   Total Bilirubin 08/04/2023 1.0  0.3 - 1.2 mg/dL Final   GFR, Estimated 08/04/2023 >  60  >60 mL/min Final   Comment: (NOTE) Calculated using the CKD-EPI Creatinine Equation (2021)    Anion gap 08/04/2023 15  5 - 15 Final   Performed at [facility], [address], [place], [place] [address]   Hgb A1c MFr Bld 08/04/2023 5.4  4.8 - 5.6 % Final   Comment: (NOTE) Pre diabetes:           5.7%-6.4%  Diabetes:              >6.4%  Glycemic control for   <7.0% adults with diabetes    Mean Plasma Glucose 08/04/2023 108.28  mg/dL Final   Performed at [facility], [address] [place], [place] [address]   TSH 08/04/2023 0.613  0.350 - 4.500 uIU/mL Final   Comment: Performed by a 3rd Generation assay with a functional sensitivity of <=0.01 uIU/mL. Performed at [facility], [address] [place], [place] [address]    Neisseria Gonorrhea 08/04/2023 Negative   Final   Chlamydia 08/04/2023 Negative   Final   Comment 08/04/2023 Normal Reference Ranger Chlamydia - Negative   Final   Comment 08/04/2023 Normal Reference Range Neisseria Gonorrhea - Negative   Final   RPR Ser Ql 08/04/2023 NON REACTIVE  NON REACTIVE Final   Performed at [facility], [address] [place], [place] [address]   HIV-1 P24 Antigen - HIV24 08/04/2023 NON REACTIVE  NON REACTIVE Final   Comment: (NOTE) Detection of p24 may be inhibited by biotin in the sample, causing false negative results in acute infection.    HIV 1/2 Antibodies 08/04/2023 NON REACTIVE  NON REACTIVE Final   Interpretation (HIV Ag Ab) 08/04/2023 A non reactive test result means that HIV 1 or HIV 2 antibodies and HIV 1 p24 antigen were not detected in the specimen.   Final   Performed at [facility], [address] [place], [place] [address]   Hepatitis B Surface Ag 08/04/2023 NON REACTIVE  NON REACTIVE Final   HCV Ab 08/04/2023 Reactive (A)  NON REACTIVE Final   Comment: (NOTE) The CDC recommends that a Reactive HCV antibody result be followed up  with a HCV Nucleic Acid Amplification test.     Hep A IgM 08/04/2023 NON REACTIVE  NON REACTIVE Final   Hep B C IgM 08/04/2023 NON REACTIVE  NON REACTIVE Final   Performed at [facility], [address] [place], [place] [address]   POC Amphetamine UR 08/04/2023 None Detected  NONE DETECTED (Cut Off Level 1000 ng/mL) Final   POC Secobarbital (BAR)  08/04/2023 None Detected  NONE DETECTED (Cut Off Level 300 ng/mL) Final   POC Buprenorphine (BUP) 08/04/2023 None Detected  NONE DETECTED (Cut Off Level 10 ng/mL) Final   POC Oxazepam (BZO) 08/04/2023 None Detected  NONE DETECTED (Cut Off Level 300 ng/mL) Final   POC Cocaine UR 08/04/2023 Positive (A)  NONE DETECTED (Cut Off Level 300 ng/mL) Final   POC Methamphetamine UR 08/04/2023 None Detected  NONE DETECTED (Cut Off Level 1000 ng/mL) Final   POC Morphine 08/04/2023 None Detected  NONE DETECTED (Cut Off Level 300 ng/mL) Final   POC Methadone UR 08/04/2023 None Detected  NONE DETECTED (Cut Off Level 300 ng/mL) Final   POC Oxycodone UR 08/04/2023 None Detected  NONE DETECTED (Cut Off Level 100 ng/mL) Final   POC Marijuana UR 08/04/2023 Positive (A)  NONE DETECTED (Cut Off Level 50 ng/mL) Final   Cocaine Metab  Quant, Ur 08/04/2023 Positive (A)  Cutoff=300 Final   Benzoylecgonine GC/MS Conf 08/04/2023 35,080  Cutoff=150 ng/mL Final   Comment: (NOTE) Performed At: [facility] [address] [place], [place] [id number] Avis Epley PhD [id number]    Ethanol, Urine 08/04/2023 FID) (A)  Cutoff=0.020 Final   Comment: Positive Confirmation performed by Gas Chromatography (GC    Ethanol, Ur - Confirmation 08/04/2023 0.099  Cutoff=0.020 % Final   Comment: (NOTE) GLUCOSE NEGATIVE Performed At: [facility] [address] [place], [place] [id number] Avis Epley PhD GM:[id number]   Admission on 06/15/2023, Discharged on 06/16/2023  Component Date Value Ref Range Status   WBC 06/15/2023 5.8  4.0 - 10.5 K/uL Final   RBC 06/15/2023 4.61  4.22 - 5.81 MIL/uL Final   Hemoglobin 06/15/2023 15.5  13.0 - 17.0 g/dL Final   HCT 44/12/4740 46.0  39.0 - 52.0 % Final   MCV 06/15/2023 99.8  80.0 - 100.0 fL Final   MCH 06/15/2023 33.6  26.0 - 34.0 pg Final   MCHC 06/15/2023 33.7  30.0 - 36.0 g/dL Final   RDW 59/56/3875 13.3  11.5 - 15.5 % Final   Platelets 06/15/2023 244  150 - 400 K/uL Final    nRBC 06/15/2023 0.0  0.0 - 0.2 % Final   Neutrophils Relative % 06/15/2023 56  % Final   Neutro Abs 06/15/2023 3.3  1.7 - 7.7 K/uL Final   Lymphocytes Relative 06/15/2023 33  % Final   Lymphs Abs 06/15/2023 1.9  0.7 - 4.0 K/uL Final   Monocytes Relative 06/15/2023 6  % Final   Monocytes Absolute 06/15/2023 0.4  0.1 - 1.0 K/uL Final   Eosinophils Relative 06/15/2023 2  % Final   Eosinophils Absolute 06/15/2023 0.1  0.0 - 0.5 K/uL Final   Basophils Relative 06/15/2023 3  % Final   Basophils Absolute 06/15/2023 0.2 (H)  0.0 - 0.1 K/uL Final   Immature Granulocytes 06/15/2023 0  % Final   Abs Immature Granulocytes 06/15/2023 0.01  0.00 - 0.07 K/uL Final   Performed at [facility], [address] [place], [place] [address]   Sodium 06/15/2023 140  135 - 145 mmol/L Final   Potassium 06/15/2023 3.8  3.5 - 5.1 mmol/L Final   Chloride 06/15/2023 103  98 - 111 mmol/L Final   CO2 06/15/2023 28  22 - 32 mmol/L Final   Glucose, Bld 06/15/2023 108 (H)  70 - 99 mg/dL Final   Glucose reference range applies only to samples taken after fasting for at least 8 hours.   BUN 06/15/2023 5 (L)  6 - 20 mg/dL Final   Creatinine, Ser 06/15/2023 0.65  0.61 - 1.24 mg/dL Final   Calcium 95/18/8416 8.3 (L)  8.9 - 10.3 mg/dL Final   Total Protein 60/63/0160 8.6 (H)  6.5 - 8.1 g/dL Final   Albumin 10/93/2355 3.6  3.5 - 5.0 g/dL Final   AST 73/22/0254 296 (H)  15 - 41 U/L Final   ALT 06/15/2023 205 (H)  0 - 44 U/L Final   Alkaline Phosphatase 06/15/2023 145 (H)  38 - 126 U/L Final   Total Bilirubin 06/15/2023 0.4  0.3 - 1.2 mg/dL Final   GFR, Estimated 06/15/2023 >60  >60 mL/min Final   Comment: (NOTE) Calculated using the CKD-EPI Creatinine Equation (2021)    Anion gap 06/15/2023 9  5 - 15 Final   Performed at [facility], [address], [place], [place] [address]  Lipase 06/15/2023 27  11 - 51 U/L Final   Performed at [facility], [address], [place], [place] [address]   Specimen Source 06/16/2023 URINE, CLEAN CATCH   Final   Color, Urine 06/16/2023 YELLOW  YELLOW Final   APPearance 06/16/2023 HAZY (A)  CLEAR Final   Specific Gravity, Urine 06/16/2023 1.015  1.005 - 1.030 Final   pH 06/16/2023 6.0  5.0 - 8.0 Final   Glucose, UA 06/16/2023 NEGATIVE  NEGATIVE mg/dL Final   Hgb urine dipstick 06/16/2023 NEGATIVE  NEGATIVE Final   Bilirubin Urine 06/16/2023 NEGATIVE  NEGATIVE Final   Ketones, ur 06/16/2023 NEGATIVE  NEGATIVE mg/dL Final   Protein, ur 60/45/4098 NEGATIVE  NEGATIVE mg/dL Final   Nitrite 11/91/4782 NEGATIVE  NEGATIVE Final   Leukocytes,Ua 06/16/2023 NEGATIVE  NEGATIVE Final   RBC / HPF 06/16/2023 0-5  0 - 5 RBC/hpf Final   WBC, UA 06/16/2023 0-5  0 - 5 WBC/hpf Final   Comment:        Reflex urine culture not performed if WBC <=10, OR if Squamous epithelial cells >5. If Squamous epithelial cells >5 suggest recollection.    Bacteria, UA 06/16/2023 NONE SEEN  NONE SEEN Final   Squamous Epithelial / HPF 06/16/2023 0-5  0 - 5 /HPF Final   Mucus 06/16/2023 PRESENT   Final   Performed at [facility], [address] [place], [place] [address]   Alcohol, Ethyl (B) 06/15/2023 288 (H)  <10 mg/dL Final   Comment: (NOTE) Lowest detectable limit for serum alcohol is 10 mg/dL.  For medical purposes only. Performed at [facility], [address] [place], [place] [address]    Salicylate Lvl 06/15/2023 <7.0 (L)  7.0 - 30.0 mg/dL Final   Performed at [facility], [address], [place], [place] [address]   Acetaminophen (Tylenol), Serum 06/15/2023 <10 (L)  10 - 30 ug/mL Final   Comment: (NOTE) Therapeutic concentrations vary significantly. A range of 10-30 ug/mL  may be an effective concentration for many patients. However, some  are best treated at concentrations outside of this range. Acetaminophen concentrations >150 ug/mL at 4 hours after ingestion  and >50  ug/mL at 12 hours after ingestion are often associated with  toxic reactions.  Performed at [facility], [address] [place], [place] [address]    Opiates 06/16/2023 NONE DETECTED  NONE DETECTED Final   Cocaine 06/16/2023 POSITIVE (A)  NONE DETECTED Final   Benzodiazepines 06/16/2023 NONE DETECTED  NONE DETECTED Final   Amphetamines 06/16/2023 NONE DETECTED  NONE DETECTED Final   Tetrahydrocannabinol 06/16/2023 POSITIVE (A)  NONE DETECTED Final   Barbiturates 06/16/2023 NONE DETECTED  NONE DETECTED Final   Comment: (NOTE) DRUG SCREEN FOR MEDICAL PURPOSES ONLY.  IF CONFIRMATION IS NEEDED FOR ANY PURPOSE, NOTIFY LAB WITHIN 5 DAYS.  LOWEST DETECTABLE LIMITS FOR URINE DRUG SCREEN Drug Class                     Cutoff (ng/mL) Amphetamine and metabolites    1000 Barbiturate and metabolites    200 Benzodiazepine                 200 Opiates and metabolites        300 Cocaine and metabolites        300 THC  50 Performed at [facility], [address], [place], [place] [address]   Admission on 06/09/2023, Discharged on 06/10/2023  Component Date Value Ref Range Status   Lipase 06/09/2023 38  11 - 51 U/L Final   Performed at [facility], [address] [place], [place] [address]   Sodium 06/09/2023 137  135 - 145 mmol/L Final   Potassium 06/09/2023 3.6  3.5 - 5.1 mmol/L Final   Chloride 06/09/2023 100  98 - 111 mmol/L Final   CO2 06/09/2023 25  22 - 32 mmol/L Final   Glucose, Bld 06/09/2023 140 (H)  70 - 99 mg/dL Final   Glucose reference range applies only to samples taken after fasting for at least 8 hours.   BUN 06/09/2023 14  6 - 20 mg/dL Final   Creatinine, Ser 06/09/2023 0.75  0.61 - 1.24 mg/dL Final   Calcium 57/84/6962 8.6 (L)  8.9 - 10.3 mg/dL Final   Total Protein 95/28/4132 8.4 (H)  6.5 - 8.1 g/dL Final   Albumin 44/10/270 3.6  3.5 - 5.0 g/dL Final   AST 53/66/4403 222 (H)  15 - 41  U/L Final   ALT 06/09/2023 176 (H)  0 - 44 U/L Final   Alkaline Phosphatase 06/09/2023 183 (H)  38 - 126 U/L Final   Total Bilirubin 06/09/2023 0.7  0.3 - 1.2 mg/dL Final   GFR, Estimated 06/09/2023 >60  >60 mL/min Final   Comment: (NOTE) Calculated using the CKD-EPI Creatinine Equation (2021)    Anion gap 06/09/2023 12  5 - 15 Final   Performed at [facility], [address], [place], [place] [address]   WBC 06/09/2023 10.3  4.0 - 10.5 K/uL Final   RBC 06/09/2023 4.30  4.22 - 5.81 MIL/uL Final   Hemoglobin 06/09/2023 14.7  13.0 - 17.0 g/dL Final   HCT 95/63/8756 42.1  39.0 - 52.0 % Final   MCV 06/09/2023 97.9  80.0 - 100.0 fL Final   MCH 06/09/2023 34.2 (H)  26.0 - 34.0 pg Final   MCHC 06/09/2023 34.9  30.0 - 36.0 g/dL Final   RDW 43/32/9518 12.8  11.5 - 15.5 % Final   Platelets 06/09/2023 315  150 - 400 K/uL Final   nRBC 06/09/2023 0.0  0.0 - 0.2 % Final   Performed at [facility], [address], [place], [place] [address]   Color, Urine 06/10/2023 YELLOW  YELLOW Final   APPearance 06/10/2023 CLEAR  CLEAR Final   Specific Gravity, Urine 06/10/2023 1.010  1.005 - 1.030 Final   pH 06/10/2023 5.0  5.0 - 8.0 Final   Glucose, UA 06/10/2023 NEGATIVE  NEGATIVE mg/dL Final   Hgb urine dipstick 06/10/2023 NEGATIVE  NEGATIVE Final   Bilirubin Urine 06/10/2023 NEGATIVE  NEGATIVE Final   Ketones, ur 06/10/2023 NEGATIVE  NEGATIVE mg/dL Final   Protein, ur 04/01/1600 NEGATIVE  NEGATIVE mg/dL Final   Nitrite 09/32/3557 NEGATIVE  NEGATIVE Final   Leukocytes,Ua 06/10/2023 NEGATIVE  NEGATIVE Final   Performed at [facility], [address], [place], [place] [address]  Admission on 06/09/2023, Discharged on 06/09/2023  Component Date Value Ref Range Status   Opiates 06/09/2023 NONE DETECTED  NONE DETECTED Final   Cocaine 06/09/2023 POSITIVE (A)  NONE DETECTED Final   Benzodiazepines 06/09/2023 NONE DETECTED  NONE DETECTED Final    Amphetamines 06/09/2023 NONE DETECTED  NONE DETECTED Final   Tetrahydrocannabinol 06/09/2023 POSITIVE (A)  NONE DETECTED Final   Barbiturates 06/09/2023  NONE DETECTED  NONE DETECTED Final   Comment: (NOTE) DRUG SCREEN FOR MEDICAL PURPOSES ONLY.  IF CONFIRMATION IS NEEDED FOR ANY PURPOSE, NOTIFY LAB WITHIN 5 DAYS.  LOWEST DETECTABLE LIMITS FOR URINE DRUG SCREEN Drug Class                     Cutoff (ng/mL) Amphetamine and metabolites    1000 Barbiturate and metabolites    200 Benzodiazepine                 200 Opiates and metabolites        300 Cocaine and metabolites        300 THC                            50 Performed at [facility], [address] [place], [place] [address]    Color, Urine 06/09/2023 YELLOW  YELLOW Final   APPearance 06/09/2023 CLEAR  CLEAR Final   Specific Gravity, Urine 06/09/2023 1.016  1.005 - 1.030 Final   pH 06/09/2023 8.0  5.0 - 8.0 Final   Glucose, UA 06/09/2023 NEGATIVE  NEGATIVE mg/dL Final   Hgb urine dipstick 06/09/2023 NEGATIVE  NEGATIVE Final   Bilirubin Urine 06/09/2023 NEGATIVE  NEGATIVE Final   Ketones, ur 06/09/2023 NEGATIVE  NEGATIVE mg/dL Final   Protein, ur 60/45/4098 NEGATIVE  NEGATIVE mg/dL Final   Nitrite 11/91/4782 NEGATIVE  NEGATIVE Final   Leukocytes,Ua 06/09/2023 NEGATIVE  NEGATIVE Final   Performed at [facility], [address], [place], [place] [address]  Admission on 06/03/2023, Discharged on 06/05/2023  Component Date Value Ref Range Status   WBC 06/03/2023 6.4  4.0 - 10.5 K/uL Final   RBC 06/03/2023 3.88 (L)  4.22 - 5.81 MIL/uL Final   Hemoglobin 06/03/2023 13.3  13.0 - 17.0 g/dL Final   HCT 30/86/5784 38.0 (L)  39.0 - 52.0 % Final   MCV 06/03/2023 97.9  80.0 - 100.0 fL Final   MCH 06/03/2023 34.3 (H)  26.0 - 34.0 pg Final   MCHC 06/03/2023 35.0  30.0 - 36.0 g/dL Final   RDW 69/62/9528 13.0  11.5 - 15.5 % Final   Platelets 06/03/2023 233  150 - 400 K/uL Final   nRBC 06/03/2023  0.0  0.0 - 0.2 % Final   Neutrophils Relative % 06/03/2023 49  % Final   Neutro Abs 06/03/2023 3.2  1.7 - 7.7 K/uL Final   Lymphocytes Relative 06/03/2023 40  % Final   Lymphs Abs 06/03/2023 2.6  0.7 - 4.0 K/uL Final   Monocytes Relative 06/03/2023 7  % Final   Monocytes Absolute 06/03/2023 0.4  0.1 - 1.0 K/uL Final   Eosinophils Relative 06/03/2023 2  % Final   Eosinophils Absolute 06/03/2023 0.1  0.0 - 0.5 K/uL Final   Basophils Relative 06/03/2023 2  % Final   Basophils Absolute 06/03/2023 0.1  0.0 - 0.1 K/uL Final   Immature Granulocytes 06/03/2023 0  % Final   Abs Immature Granulocytes 06/03/2023 0.02  0.00 - 0.07 K/uL Final   Performed at [facility], [address] [place], [place] [address]   Sodium 06/03/2023 140  135 - 145 mmol/L Final   Potassium 06/03/2023 3.4 (L)  3.5 - 5.1 mmol/L Final   Chloride 06/03/2023 104  98 - 111 mmol/L Final   CO2 06/03/2023 26  22 - 32 mmol/L Final   Glucose, Bld 06/03/2023 112 (H)  70 - 99 mg/dL Final   Glucose reference range applies only to samples taken after fasting for at least 8 hours.   BUN 06/03/2023 5 (L)  6 - 20 mg/dL Final   Creatinine, Ser 06/03/2023 1.04  0.61 - 1.24 mg/dL Final   Calcium 25/42/7062 8.3 (L)  8.9 - 10.3 mg/dL Final   Total Protein 37/62/8315 7.0  6.5 - 8.1 g/dL Final   Albumin 17/61/6073 3.2 (L)  3.5 - 5.0 g/dL Final   AST 71/03/2693 258 (H)  15 - 41 U/L Final   ALT 06/03/2023 186 (H)  0 - 44 U/L Final   Alkaline Phosphatase 06/03/2023 107  38 - 126 U/L Final   Total Bilirubin 06/03/2023 0.8  0.3 - 1.2 mg/dL Final   GFR, Estimated 06/03/2023 >60  >60 mL/min Final   Comment: (NOTE) Calculated using the CKD-EPI Creatinine Equation (2021)    Anion gap 06/03/2023 10  5 - 15 Final   Performed at [facility], [address], [place], [place] [address]   Alcohol, Ethyl (B) 06/03/2023 285 (H)  <10 mg/dL Final   Comment: (NOTE) Lowest detectable limit for serum alcohol is 10 mg/dL.  For medical  purposes only. Performed at [facility], [address], [place], [place] [address]    POC Amphetamine UR 06/03/2023 None Detected  NONE DETECTED (Cut Off Level 1000 ng/mL) Final   POC Secobarbital (BAR) 06/03/2023 None Detected  NONE DETECTED (Cut Off Level 300 ng/mL) Final   POC Buprenorphine (BUP) 06/03/2023 None Detected  NONE DETECTED (Cut Off Level 10 ng/mL) Final   POC Oxazepam (BZO) 06/03/2023 None Detected  NONE DETECTED (Cut Off Level 300 ng/mL) Final   POC Cocaine UR 06/03/2023 Positive (A)  NONE DETECTED (Cut Off Level 300 ng/mL) Final   POC Methamphetamine UR 06/03/2023 None Detected  NONE DETECTED (Cut Off Level 1000 ng/mL) Final   POC Morphine 06/03/2023 Positive (A)  NONE DETECTED (Cut Off Level 300 ng/mL) Final   POC Methadone UR 06/03/2023 None Detected  NONE DETECTED (Cut Off Level 300 ng/mL) Final   POC Oxycodone UR 06/03/2023 None Detected  NONE DETECTED (Cut Off Level 100 ng/mL) Final   POC Marijuana UR 06/03/2023 Positive (A)  NONE DETECTED (Cut Off Level 50 ng/mL) Final  Admission on 05/28/2023, Discharged on 05/28/2023  Component Date Value Ref Range Status   WBC 05/28/2023 11.2 (H)  4.0 - 10.5 K/uL Final   RBC 05/28/2023 4.18 (L)  4.22 - 5.81 MIL/uL Final   Hemoglobin 05/28/2023 14.3  13.0 - 17.0 g/dL Final   HCT 09/38/1829 41.5  39.0 - 52.0 % Final   MCV 05/28/2023 99.3  80.0 - 100.0 fL Final   MCH 05/28/2023 34.2 (H)  26.0 - 34.0 pg Final   MCHC 05/28/2023 34.5  30.0 - 36.0 g/dL Final   RDW 93/71/6967 13.2  11.5 - 15.5 % Final   Platelets 05/28/2023 244  150 - 400 K/uL Final   nRBC 05/28/2023 0.0  0.0 - 0.2 % Final   Neutrophils Relative % 05/28/2023 65  % Final   Neutro Abs 05/28/2023 7.3  1.7 - 7.7 K/uL Final   Lymphocytes Relative 05/28/2023 27  % Final   Lymphs Abs 05/28/2023 3.0  0.7 - 4.0 K/uL Final   Monocytes Relative 05/28/2023 6  % Final   Monocytes Absolute 05/28/2023 0.7  0.1 - 1.0 K/uL Final   Eosinophils Relative 05/28/2023 1  % Final    Eosinophils Absolute 05/28/2023 0.1  0.0 - 0.5 K/uL Final   Basophils Relative 05/28/2023 1  % Final   Basophils Absolute 05/28/2023 0.1  0.0 - 0.1 K/uL Final   Immature Granulocytes 05/28/2023 0  % Final   Abs Immature Granulocytes 05/28/2023 0.04  0.00 - 0.07 K/uL Final   Performed at [facility], [address], [place], [place] [address]   Sodium 05/28/2023 139  135 - 145 mmol/L Final   Potassium 05/28/2023 3.1 (L)  3.5 - 5.1 mmol/L Final   Chloride 05/28/2023 106  98 - 111 mmol/L Final   CO2 05/28/2023 22  22 - 32 mmol/L Final   Glucose, Bld 05/28/2023 100 (H)  70 - 99 mg/dL Final   Glucose reference range applies only to samples taken after fasting for at least 8 hours.   BUN 05/28/2023 6  6 - 20 mg/dL Final   Creatinine, Ser 05/28/2023 0.70  0.61 - 1.24 mg/dL Final   Calcium 60/45/4098 8.0 (L)  8.9 - 10.3 mg/dL Final   GFR, Estimated 05/28/2023 >60  >60 mL/min Final   Comment: (NOTE) Calculated using the CKD-EPI Creatinine Equation (2021)    Anion gap 05/28/2023 11  5 - 15 Final   Performed at [facility], [address], [place], [place] [address]   Alcohol, Ethyl (B) 05/28/2023 279 (H)  <10 mg/dL Final   Comment: (NOTE) Lowest detectable limit for serum alcohol is 10 mg/dL.  For medical purposes only. Performed at [facility], [address], [place], [place] [address]    Opiates 05/28/2023 POSITIVE (A)  NONE DETECTED Final   Cocaine 05/28/2023 POSITIVE (A)  NONE DETECTED Final   Benzodiazepines 05/28/2023 NONE DETECTED  NONE DETECTED Final   Amphetamines 05/28/2023 POSITIVE (A)  NONE DETECTED Final   Tetrahydrocannabinol 05/28/2023 POSITIVE (A)  NONE DETECTED Final   Barbiturates 05/28/2023 NONE DETECTED  NONE DETECTED Final   Comment: (NOTE) DRUG SCREEN FOR MEDICAL PURPOSES ONLY.  IF CONFIRMATION IS NEEDED FOR ANY PURPOSE, NOTIFY LAB WITHIN 5 DAYS.  LOWEST DETECTABLE LIMITS FOR URINE DRUG SCREEN Drug Class                     Cutoff  (ng/mL) Amphetamine and metabolites    1000 Barbiturate and metabolites    200 Benzodiazepine                 200 Opiates and metabolites        300 Cocaine and metabolites        300 THC                            50 Performed at [facility], [address] [place], [place] [address]   There may be more visits with results that are not included.    Allergies: Codeine  Medications:  Facility Ordered Medications  Medication   [COMPLETED] dexamethasone (DECADRON) injection 10 mg   [COMPLETED] ibuprofen (ADVIL) tablet 800 mg   acetaminophen (TYLENOL) tablet 650 mg   alum & mag hydroxide-simeth (MAALOX/MYLANTA) 200-200-20 MG/5ML suspension 30 mL   magnesium hydroxide (MILK OF MAGNESIA) suspension 30 mL   dicyclomine (BENTYL) tablet 20 mg   hydrOXYzine (ATARAX) tablet 25 mg   loperamide (IMODIUM) capsule 2-4 mg   methocarbamol (ROBAXIN) tablet 500 mg   naproxen (NAPROSYN) tablet 500 mg   ondansetron (ZOFRAN-ODT) disintegrating tablet 4 mg   cloNIDine (CATAPRES) tablet 0.1 mg  Followed by   Melene Muller ON 11/23/2023] cloNIDine (CATAPRES) tablet 0.1 mg   Followed by   Melene Muller ON 11/26/2023] cloNIDine (CATAPRES) tablet 0.1 mg   OLANZapine zydis (ZYPREXA) disintegrating tablet 5 mg   OLANZapine (ZYPREXA) injection 5 mg   OLANZapine (ZYPREXA) injection 10 mg   PTA Medications  Medication Sig   nicotine (NICODERM CQ - DOSED IN MG/24 HOURS) 21 mg/24hr patch Place 1 patch (21 mg total) onto the skin daily as needed (nicotine cravings).   melatonin 3 MG TABS tablet Take 1 tablet (3 mg total) by mouth at bedtime as needed. (Patient taking differently: Take 3 mg by mouth at bedtime as needed (for sleep).)   nicotine polacrilex (NICORETTE) 2 MG gum Take 1 each (2 mg total) by mouth as needed for smoking cessation.   naloxone (NARCAN) nasal spray 4 mg/0.1 mL Place 1 spray into the nose once as needed (for an opioid crisis).   ADVIL 200 MG CAPS Take 1-2 capsules (200-400 mg total) by mouth  every 6 (six) hours as needed (for pain or headaches).   methocarbamol (ROBAXIN) 500 MG tablet Take 1 tablet (500 mg total) by mouth every 8 (eight) hours as needed.    Long Term Goals: Improvement in symptoms so as ready for discharge  Short Term Goals: Patient will verbalize feelings in meetings with treatment team members., Patient will attend at least of 50% of the groups daily., Pt will complete the PHQ9 on admission, day 3 and discharge., Patient will participate in completing the [place] Suicide Severity Rating Scale, Patient will score a low risk of violence for 24 hours prior to discharge, and Patient will take medications as prescribed daily.  Medical Decision Making  Inpatient FBC  Lab Orders         SARS Coronavirus 2 by RT PCR (hospital order, performed in [facility] hospital lab) *cepheid single result test* Anterior Nasal Swab         CBC with Differential/Platelet         Comprehensive metabolic panel         Ethanol         TSH         POCT Urine Drug Screen - (I-Screen)      Meds ordered this encounter  Medications   acetaminophen (TYLENOL) tablet 650 mg   alum & mag hydroxide-simeth (MAALOX/MYLANTA) 200-200-20 MG/5ML suspension 30 mL   magnesium hydroxide (MILK OF MAGNESIA) suspension 30 mL   dicyclomine (BENTYL) tablet 20 mg   hydrOXYzine (ATARAX) tablet 25 mg   loperamide (IMODIUM) capsule 2-4 mg   methocarbamol (ROBAXIN) tablet 500 mg   naproxen (NAPROSYN) tablet 500 mg   ondansetron (ZOFRAN-ODT) disintegrating tablet 4 mg   FOLLOWED BY Linked Order Group    cloNIDine (CATAPRES) tablet 0.1 mg    cloNIDine (CATAPRES) tablet 0.1 mg    cloNIDine (CATAPRES) tablet 0.1 mg   OLANZapine zydis (ZYPREXA) disintegrating tablet 5 mg   OLANZapine (ZYPREXA) injection 5 mg   OLANZapine (ZYPREXA) injection 10 mg     Recommendations  Based on my evaluation the patient does not appear to have an emergency medical condition.  Sindy Guadeloupe, NP 11/22/23  5:14 AM

## 2023-11-21 NOTE — ED Triage Notes (Signed)
 C/o left leg pain/spasm x 1 month. Able to bear some weight. Seen at [facility] for same multiple times.

## 2023-11-21 NOTE — Progress Notes (Signed)
   11/21/23 1922  [facility] Triage Screening (Walk-ins at [facility] only)  How Did You Hear About [place]? Self  What Is the Reason for Your Visit/Call Today? Pt presents to [facility] as a voluntary walk-in, unaccompanied with complaint of SI, with no plan or intent, AH and substance abuse treatment/detox. Pt reports using crack, alcohol and meth yesterday. Pt reports using crack and alcohol on a daily basis and only Meth ocassionally. Pt reports experiencing symptoms of shakes and cold sweats at this time. Pt reports history of  Bipolar, MDD, Schizophrenia and intentional overdose in December of 2024. Pt denies taking psychotropic medications at this time. Pt reports experiencing AH while sitting in the lobby, but denies at this moment during triage. Pt reports that he hears muffled sounds, voices telling him he should get high, etc. Pt currently denies HI,VH.  How Long Has This Been Causing You Problems? > than 6 months  Have You Recently Had Any Thoughts About Hurting Yourself? Yes  How long ago did you have thoughts about hurting yourself? earlier today  Are You Planning to Commit Suicide/Harm Yourself At This time? No  Have you Recently Had Thoughts About Hurting Someone [name]? No  Are You Planning To Harm Someone At This Time? No  Physical Abuse Denies  Verbal Abuse Denies  Sexual Abuse Denies  Exploitation of patient/patient's resources Denies  Self-Neglect Yes, present (Comment)  Are you currently experiencing any auditory, visual or other hallucinations? Yes  Please explain the hallucinations you are currently experiencing: AH that he describes as muffled sounds and voices telling him he should get high  Have You Used Any Alcohol or Drugs in the Past 24 Hours? Yes  What Did You Use and How Much? THC, crack (2 1/2 grams) alcohol (3-40oz beers, fifth of liquor and 16oz of Mike[employer] Hard Lemonade)  Do you have any current medical co-morbidities that require immediate attention? No  Clinician description of patient  physical appearance/behavior: calm,cooperative, unclean clothing  What Do You Feel Would Help You the Most Today? Treatment for Depression or other mood problem;Alcohol or Drug Use Treatment  If access to [facility] was not available, would you have sought care in the Emergency Department? No  Determination of Need Urgent (48 hours)  Options For Referral Other: Comment;[facility] Urgent Care;Facility-Based Crisis;Chemical Dependency Intensive Outpatient Therapy (CDIOP);Outpatient Therapy  Determination of Need filed? Yes

## 2023-11-21 NOTE — Discharge Instructions (Signed)
 You have been seen today for your complaint of left leg pain. Your discharge medications include the medications that were previously sent for you.  Try to pick these medications up to help with your symptoms. Follow up with: [facility] to continue monitoring your symptoms Please seek immediate medical care if you develop any of the following symptoms: You have trouble breathing. You have trouble swallowing. You have muscle pain along with a stiff neck, fever, and vomiting. You have severe muscle weakness, or you cannot move part of your body. You are urinating less, or you have dark, bloody, or discolored urine. You have redness or swelling at the site of the muscle pain. At this time there does not appear to be the presence of an emergent medical condition, however there is always the potential for conditions to change. Please read and follow the below instructions.  Do not take your medicine if  develop an itchy rash, swelling in your mouth or lips, or difficulty breathing; call 911 and seek immediate emergency medical attention if this occurs.  You may review your lab tests and imaging results in their entirety on your MyChart account.  Please discuss all results of fully with your primary care provider and other specialist at your follow-up visit.  Note: Portions of this text may have been transcribed using voice recognition software. Every effort was made to ensure accuracy; however, inadvertent computerized transcription errors may still be present.

## 2023-11-22 DIAGNOSIS — F1010 Alcohol abuse, uncomplicated: Secondary | ICD-10-CM | POA: Diagnosis not present

## 2023-11-22 LAB — COMPREHENSIVE METABOLIC PANEL
ALT: 55 U/L — ABNORMAL HIGH (ref 0–44)
AST: 91 U/L — ABNORMAL HIGH (ref 15–41)
Albumin: 2.8 g/dL — ABNORMAL LOW (ref 3.5–5.0)
Alkaline Phosphatase: 87 U/L (ref 38–126)
Anion gap: 10 (ref 5–15)
BUN: 7 mg/dL (ref 6–20)
CO2: 26 mmol/L (ref 22–32)
Calcium: 8.9 mg/dL (ref 8.9–10.3)
Chloride: 99 mmol/L (ref 98–111)
Creatinine, Ser: 0.63 mg/dL (ref 0.61–1.24)
GFR, Estimated: >60 mL/min (ref >60)
Glucose, Bld: 162 mg/dL — ABNORMAL HIGH (ref 70–99)
Potassium: 4.2 mmol/L (ref 3.5–5.1)
Sodium: 135 mmol/L (ref 135–145)
Total Bilirubin: 0.8 mg/dL (ref 0.0–1.2)
Total Protein: 8.1 g/dL (ref 6.5–8.1)

## 2023-11-22 LAB — ETHANOL: Alcohol, Ethyl (B): <10 mg/dL (ref <10)

## 2023-11-22 LAB — CBC WITH DIFFERENTIAL/PLATELET
Abs Immature Granulocytes: 0.01 10*3/uL (ref 0.00–0.07)
Basophils Absolute: 0 10*3/uL (ref 0.0–0.1)
Basophils Relative: 0 %
Eosinophils Absolute: 0 10*3/uL (ref 0.0–0.5)
Eosinophils Relative: 0 %
HCT: 38.8 % — ABNORMAL LOW (ref 39.0–52.0)
Hemoglobin: 13.1 g/dL (ref 13.0–17.0)
Immature Granulocytes: 0 %
Lymphocytes Relative: 14 %
Lymphs Abs: 0.8 10*3/uL (ref 0.7–4.0)
MCH: 31.6 pg (ref 26.0–34.0)
MCHC: 33.8 g/dL (ref 30.0–36.0)
MCV: 93.7 fL (ref 80.0–100.0)
Monocytes Absolute: 0.2 10*3/uL (ref 0.1–1.0)
Monocytes Relative: 3 %
Neutro Abs: 4.6 10*3/uL (ref 1.7–7.7)
Neutrophils Relative %: 83 %
Platelets: 298 10*3/uL (ref 150–400)
RBC: 4.14 MIL/uL — ABNORMAL LOW (ref 4.22–5.81)
RDW: 13.2 % (ref 11.5–15.5)
WBC: 5.6 10*3/uL (ref 4.0–10.5)
nRBC: 0 % (ref 0.0–0.2)

## 2023-11-22 LAB — POCT URINE DRUG SCREEN - MANUAL ENTRY (I-SCREEN)
POC Amphetamine UR: NOT DETECTED
POC Buprenorphine (BUP): NOT DETECTED
POC Cocaine UR: POSITIVE — AB
POC Marijuana UR: POSITIVE — AB
POC Methadone UR: NOT DETECTED
POC Methamphetamine UR: NOT DETECTED
POC Morphine: NOT DETECTED
POC Oxazepam (BZO): POSITIVE — AB
POC Oxycodone UR: NOT DETECTED
POC Secobarbital (BAR): NOT DETECTED

## 2023-11-22 LAB — TSH: TSH: 0.365 u[IU]/mL (ref 0.350–4.500)

## 2023-11-22 MED ORDER — ADULT MULTIVITAMIN W/MINERALS CH
1.0000 | ORAL_TABLET | Freq: Every day | ORAL | Status: DC
  Administered 2023-11-22 – 2023-11-26 (×5): 1 via ORAL
  Filled 2023-11-22 (×5): qty 1

## 2023-11-22 MED ORDER — CHLORDIAZEPOXIDE HCL 25 MG PO CAPS
25.0000 mg | ORAL_CAPSULE | ORAL | Status: AC
  Administered 2023-11-24 (×2): 25 mg via ORAL
  Filled 2023-11-22 (×2): qty 1

## 2023-11-22 MED ORDER — CHLORDIAZEPOXIDE HCL 25 MG PO CAPS: 25.0000 mg | ORAL_CAPSULE | Freq: Four times a day (QID) | ORAL | Status: AC | PRN

## 2023-11-22 MED ORDER — CHLORDIAZEPOXIDE HCL 25 MG PO CAPS
25.0000 mg | ORAL_CAPSULE | Freq: Four times a day (QID) | ORAL | Status: AC
  Administered 2023-11-22 (×4): 25 mg via ORAL
  Filled 2023-11-22 (×4): qty 1

## 2023-11-22 MED ORDER — CHLORDIAZEPOXIDE HCL 25 MG PO CAPS
25.0000 mg | ORAL_CAPSULE | Freq: Every day | ORAL | Status: AC
  Administered 2023-11-25: 25 mg via ORAL
  Filled 2023-11-22: qty 1

## 2023-11-22 MED ORDER — THIAMINE HCL 100 MG/ML IJ SOLN: 100.0000 mg | Freq: Once | INTRAMUSCULAR | Status: DC

## 2023-11-22 MED ORDER — CHLORDIAZEPOXIDE HCL 25 MG PO CAPS
25.0000 mg | ORAL_CAPSULE | Freq: Three times a day (TID) | ORAL | Status: AC
  Administered 2023-11-23 (×3): 25 mg via ORAL
  Filled 2023-11-22 (×3): qty 1

## 2023-11-22 NOTE — ED Provider Notes (Signed)
 Behavioral Health Progress Note  Date and Time: 11/22/2023 11:14 AM Name: Mark Hammond MRN:  [id number]  Subjective: "I feel sluggish and moody"   Mark Hammond, 33 y.o., male patient seen face to face by this provider and chart reviewed by Dr. Woodroe Mode on 11/22/2023.   HPI: Mark Hammond, 33 y/o male, with a history of MDD, schizoaffective disorder, polysubstance abuse, alcohol intoxication, intentional overdose.  Presented to [facility] voluntarily.  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  According to the patient he consume 3, 40 ounce beer and other liquor.  Patient reports he also used 2-1/2 g of cocaine has been using daily.  Patient stated he is unemployed, lives with his girlfriend.  Patient stated he was last hospitalized at [facility] for alcohol abuse and cocaine.  I review of patient records show multiple visits to different locations for the same issues.   Assessment: On evaluation today, the patient was observed lying in bed. He appears to be in no acute distress.  The patient endorses feeling "moody," describing it as a mix of low mood and anxiety. He reports that he typically experiences this mood shift during the first few days of detox. Despite this, he expresses motivation to enter inpatient rehabilitation and complete the program this time. He acknowledges previous rehab admissions that he did not complete. When asked what is different this time and what his main motivation is, he states, "I'm doing it for myself this time." He notes that on previous occasions, he felt somewhat "forced" into rehab.  The patient denies a history of seizures related to alcohol withdrawal or delirium tremens. However, he endorses mild tremors and abdominal pain at this time. He states that his last use of alcohol, as well as cocaine and methamphetamine, was two days ago.  He is alert, oriented 4, calm, cooperative, and attentive. His mood is depressed with  a congruent affect. Speech and behavior are normal. Objectively, there is no evidence of psychosis, mania, or delusional thinking. His thought process is coherent and goal-directed, with no signs of distractibility or preoccupation.  The patient endorses a prior suicide attempt by intentional overdose on fentanyl in December 2025. However, he denies current suicidal, self-harm, or homicidal ideation. He also denies psychotic symptoms and paranoia. He answers questions appropriately.  Diagnosis:  Final diagnoses:  Alcohol abuse  Polysubstance abuse (HCC)  Schizoaffective disorder, bipolar type (HCC)  H/O medication noncompliance    Total Time spent with patient: 30 minutes  Past Psychiatric History: Pt reports a history of MDD, Bipolar Disorder & schizophrenia Past Medical History: Denies significant medical history. Family Psychiatric  History: None reported Social History: Patient resides with his girlfriend and both are currently unemployed.   Sleep: Fair  Appetite:  Fair  Current Medications:  Current Facility-Administered Medications  Medication Dose Route Frequency Provider Last Rate Last Admin   acetaminophen (TYLENOL) tablet 650 mg  650 mg Oral Q6H PRN Sindy Guadeloupe, NP       alum & mag hydroxide-simeth (MAALOX/MYLANTA) 200-200-20 MG/5ML suspension 30 mL  30 mL Oral Q4H PRN Sindy Guadeloupe, NP       chlordiazePOXIDE (LIBRIUM) capsule 25 mg  25 mg Oral Q6H PRN Starkes-Perry, Juel Burrow, FNP       chlordiazePOXIDE (LIBRIUM) capsule 25 mg  25 mg Oral QID Maryagnes Amos, FNP   25 mg at 11/22/23 1012   Followed by   Melene Muller ON 11/23/2023] chlordiazePOXIDE (LIBRIUM) capsule 25 mg  25 mg Oral TID Maryagnes Amos, FNP       Followed by   Melene Muller ON 11/24/2023] chlordiazePOXIDE (LIBRIUM) capsule 25 mg  25 mg Oral BH-qamhs Starkes-Perry, Juel Burrow, FNP       Followed by   Melene Muller ON 11/25/2023] chlordiazePOXIDE (LIBRIUM) capsule 25 mg  25 mg Oral Daily Starkes-Perry, Juel Burrow, FNP        dicyclomine (BENTYL) tablet 20 mg  20 mg Oral Q6H PRN Sindy Guadeloupe, NP       hydrOXYzine (ATARAX) tablet 25 mg  25 mg Oral Q6H PRN Sindy Guadeloupe, NP       loperamide (IMODIUM) capsule 2-4 mg  2-4 mg Oral PRN Sindy Guadeloupe, NP       magnesium hydroxide (MILK OF MAGNESIA) suspension 30 mL  30 mL Oral Daily PRN Sindy Guadeloupe, NP       methocarbamol (ROBAXIN) tablet 500 mg  500 mg Oral Q8H PRN Sindy Guadeloupe, NP       multivitamin with minerals tablet 1 tablet  1 tablet Oral Daily Maryagnes Amos, FNP   1 tablet at 11/22/23 1012   naproxen (NAPROSYN) tablet 500 mg  500 mg Oral BID PRN Sindy Guadeloupe, NP       OLANZapine (ZYPREXA) injection 10 mg  10 mg Intramuscular TID PRN Sindy Guadeloupe, NP       OLANZapine (ZYPREXA) injection 5 mg  5 mg Intramuscular TID PRN Sindy Guadeloupe, NP       OLANZapine zydis (ZYPREXA) disintegrating tablet 5 mg  5 mg Oral TID PRN Sindy Guadeloupe, NP       ondansetron (ZOFRAN-ODT) disintegrating tablet 4 mg  4 mg Oral Q6H PRN Sindy Guadeloupe, NP       thiamine (VITAMIN B1) injection 100 mg  100 mg Intramuscular Once Maryagnes Amos, FNP       Current Outpatient Medications  Medication Sig Dispense Refill   naloxone (NARCAN) nasal spray 4 mg/0.1 mL Place 1 spray into the nose once as needed (for an opioid crisis).     OVER THE COUNTER MEDICATION Take 1,000 mg by mouth every 6 (six) hours as needed (For pain). Tylenol Extra Strength Rapid Release Gels (Acetaminophen 500mg )     ADVIL 200 MG CAPS Take 1-2 capsules (200-400 mg total) by mouth every 6 (six) hours as needed (for pain or headaches). (Patient not taking: Reported on 11/22/2023) 40 capsule 0   methocarbamol (ROBAXIN) 500 MG tablet Take 1 tablet (500 mg total) by mouth every 8 (eight) hours as needed. (Patient not taking: Reported on 11/22/2023) 8 tablet 0    Labs  Lab Results:  Admission on 11/21/2023  Component Date Value Ref Range Status   WBC 11/21/2023 5.6  4.0 - 10.5 K/uL Final   RBC 11/21/2023  4.14 (L)  4.22 - 5.81 MIL/uL Final   Hemoglobin 11/21/2023 13.1  13.0 - 17.0 g/dL Final   HCT 82/95/6213 38.8 (L)  39.0 - 52.0 % Final   MCV 11/21/2023 93.7  80.0 - 100.0 fL Final   MCH 11/21/2023 31.6  26.0 - 34.0 pg Final   MCHC 11/21/2023 33.8  30.0 - 36.0 g/dL Final   RDW 08/65/7846 13.2  11.5 - 15.5 % Final   Platelets 11/21/2023 298  150 - 400 K/uL Final   nRBC 11/21/2023 0.0  0.0 - 0.2 % Final   Neutrophils Relative % 11/21/2023 83  % Final   Neutro Abs 11/21/2023 4.6  1.7 - 7.7 K/uL Final   Lymphocytes Relative  11/21/2023 14  % Final   Lymphs Abs 11/21/2023 0.8  0.7 - 4.0 K/uL Final   Monocytes Relative 11/21/2023 3  % Final   Monocytes Absolute 11/21/2023 0.2  0.1 - 1.0 K/uL Final   Eosinophils Relative 11/21/2023 0  % Final   Eosinophils Absolute 11/21/2023 0.0  0.0 - 0.5 K/uL Final   Basophils Relative 11/21/2023 0  % Final   Basophils Absolute 11/21/2023 0.0  0.0 - 0.1 K/uL Final   Immature Granulocytes 11/21/2023 0  % Final   Abs Immature Granulocytes 11/21/2023 0.01  0.00 - 0.07 K/uL Final   Performed at [facility], [address] [place], [place] [address]   Sodium 11/21/2023 135  135 - 145 mmol/L Final   Potassium 11/21/2023 4.2  3.5 - 5.1 mmol/L Final   Chloride 11/21/2023 99  98 - 111 mmol/L Final   CO2 11/21/2023 26  22 - 32 mmol/L Final   Glucose, Bld 11/21/2023 162 (H)  70 - 99 mg/dL Final   Glucose reference range applies only to samples taken after fasting for at least 8 hours.   BUN 11/21/2023 7  6 - 20 mg/dL Final   Creatinine, Ser 11/21/2023 0.63  0.61 - 1.24 mg/dL Final   Calcium 40/98/1191 8.9  8.9 - 10.3 mg/dL Final   Total Protein 47/82/9562 8.1  6.5 - 8.1 g/dL Final   Albumin 13/05/6577 2.8 (L)  3.5 - 5.0 g/dL Final   AST 46/96/2952 91 (H)  15 - 41 U/L Final   ALT 11/21/2023 55 (H)  0 - 44 U/L Final   Alkaline Phosphatase 11/21/2023 87  38 - 126 U/L Final   Total Bilirubin 11/21/2023 0.8  0.0 - 1.2 mg/dL Final   GFR, Estimated 11/21/2023  >60  >60 mL/min Final   Comment: (NOTE) Calculated using the CKD-EPI Creatinine Equation (2021)    Anion gap 11/21/2023 10  5 - 15 Final   Performed at [facility], [address], [place], [place] [address]   Alcohol, Ethyl (B) 11/21/2023 <10  <10 mg/dL Final   Comment: (NOTE) Lowest detectable limit for serum alcohol is 10 mg/dL.  For medical purposes only. Performed at [facility], [address] [place], [place] [address]    TSH 11/21/2023 0.365  0.350 - 4.500 uIU/mL Final   Comment: Performed by a 3rd Generation assay with a functional sensitivity of <=0.01 uIU/mL. Performed at [facility], [address], [place], [place] [address]    POC Amphetamine UR 11/22/2023 None Detected  NONE DETECTED (Cut Off Level 1000 ng/mL) Final   POC Secobarbital (BAR) 11/22/2023 None Detected  NONE DETECTED (Cut Off Level 300 ng/mL) Final   POC Buprenorphine (BUP) 11/22/2023 None Detected  NONE DETECTED (Cut Off Level 10 ng/mL) Final   POC Oxazepam (BZO) 11/22/2023 Positive (A)  NONE DETECTED (Cut Off Level 300 ng/mL) Final   POC Cocaine UR 11/22/2023 Positive (A)  NONE DETECTED (Cut Off Level 300 ng/mL) Final   POC Methamphetamine UR 11/22/2023 None Detected  NONE DETECTED (Cut Off Level 1000 ng/mL) Final   POC Morphine 11/22/2023 None Detected  NONE DETECTED (Cut Off Level 300 ng/mL) Final   POC Methadone UR 11/22/2023 None Detected  NONE DETECTED (Cut Off Level 300 ng/mL) Final   POC Oxycodone UR 11/22/2023 None Detected  NONE DETECTED (Cut Off Level 100 ng/mL) Final   POC Marijuana UR 11/22/2023 Positive (A)  NONE DETECTED (Cut Off Level 50 ng/mL) Final  Admission on 11/19/2023, Discharged on 11/20/2023  Component Date Value Ref Range Status   Sodium 11/19/2023 138  135 - 145 mmol/L Final   Potassium 11/19/2023 3.4 (L)  3.5 - 5.1 mmol/L Final   Chloride 11/19/2023 103  98 - 111 mmol/L Final   CO2 11/19/2023 26  22 - 32 mmol/L Final   Glucose, Bld 11/19/2023 123 (H)  70  - 99 mg/dL Final   Glucose reference range applies only to samples taken after fasting for at least 8 hours.   BUN 11/19/2023 <5 (L)  6 - 20 mg/dL Final   Creatinine, Ser 11/19/2023 0.50 (L)  0.61 - 1.24 mg/dL Final   Calcium 16/07/9603 8.2 (L)  8.9 - 10.3 mg/dL Final   GFR, Estimated 11/19/2023 >60  >60 mL/min Final   Comment: (NOTE) Calculated using the CKD-EPI Creatinine Equation (2021)    Anion gap 11/19/2023 9  5 - 15 Final   Performed at [facility], [address] [place], [place] [address]   WBC 11/19/2023 8.4  4.0 - 10.5 K/uL Final   RBC 11/19/2023 4.00 (L)  4.22 - 5.81 MIL/uL Final   Hemoglobin 11/19/2023 12.6 (L)  13.0 - 17.0 g/dL Final   HCT 11/91/4782 37.7 (L)  39.0 - 52.0 % Final   MCV 11/19/2023 94.3  80.0 - 100.0 fL Final   MCH 11/19/2023 31.5  26.0 - 34.0 pg Final   MCHC 11/19/2023 33.4  30.0 - 36.0 g/dL Final   RDW 95/62/1308 13.2  11.5 - 15.5 % Final   Platelets 11/19/2023 267  150 - 400 K/uL Final   nRBC 11/19/2023 0.0  0.0 - 0.2 % Final   Neutrophils Relative % 11/19/2023 65  % Final   Neutro Abs 11/19/2023 5.5  1.7 - 7.7 K/uL Final   Lymphocytes Relative 11/19/2023 22  % Final   Lymphs Abs 11/19/2023 1.9  0.7 - 4.0 K/uL Final   Monocytes Relative 11/19/2023 11  % Final   Monocytes Absolute 11/19/2023 0.9  0.1 - 1.0 K/uL Final   Eosinophils Relative 11/19/2023 1  % Final   Eosinophils Absolute 11/19/2023 0.1  0.0 - 0.5 K/uL Final   Basophils Relative 11/19/2023 1  % Final   Basophils Absolute 11/19/2023 0.1  0.0 - 0.1 K/uL Final   Immature Granulocytes 11/19/2023 0  % Final   Abs Immature Granulocytes 11/19/2023 0.03  0.00 - 0.07 K/uL Final   Performed at [facility], [address] [place], [place] [address]   Sed Rate 11/19/2023 77 (H)  0 - 16 mm/hr Final   Performed at [facility], [address] [place], [place] [address]   CRP 11/19/2023 4.2 (H)  <1.0 mg/dL Final   Performed at [facility], [address], [place], [place] [address]   Sodium 11/19/2023 141  135 - 145 mmol/L Final   Potassium 11/19/2023 3.5  3.5 - 5.1 mmol/L Final   Chloride 11/19/2023 101  98 - 111 mmol/L Final   BUN 11/19/2023 <3 (L)  6 - 20 mg/dL Final   Creatinine, Ser 11/19/2023 0.70  0.61 - 1.24 mg/dL Final   Glucose, Bld 32/44/0102 126 (H)  70 - 99 mg/dL Final   Glucose reference range applies only to samples taken after fasting for at least 8 hours.   Calcium, Ion 11/19/2023 1.05 (L)  1.15 - 1.40 mmol/L Final   TCO2 11/19/2023 27  22 - 32 mmol/L Final   Hemoglobin 11/19/2023 12.9 (  L)  13.0 - 17.0 g/dL Final   HCT 16/07/9603 38.0 (L)  39.0 - 52.0 % Final   SARS Coronavirus 2 by RT PCR 11/19/2023 NEGATIVE  NEGATIVE Final   Comment: (NOTE) SARS-CoV-2 target nucleic acids are NOT DETECTED.  The SARS-CoV-2 RNA is generally detectable in upper respiratory specimens during the acute phase of infection. The lowest concentration of SARS-CoV-2 viral copies this assay can detect is 138 copies/mL. A negative result does not preclude SARS-Cov-2 infection and should not be used as the sole basis for treatment or other patient management decisions. A negative result may occur with  improper specimen collection/handling, submission of specimen other than nasopharyngeal swab, presence of viral mutation(s) within the areas targeted by this assay, and inadequate number of viral copies(<138 copies/mL). A negative result must be combined with clinical observations, patient history, and epidemiological information. The expected result is Negative.  Fact Sheet for Patients:  BloggerCourse.com  Fact Sheet for Healthcare Providers:  SeriousBroker.it  This test is no                          t yet approved or cleared by the [place] FDA and  has been authorized for detection and/or diagnosis of SARS-CoV-2 by FDA under an Emergency Use Authorization (EUA).  This EUA will remain  in effect (meaning this test can be used) for the duration of the COVID-19 declaration under Section 564(b)(1) of the Act, 21 U.S.C.section 360bbb-3(b)(1), unless the authorization is terminated  or revoked sooner.       Influenza A by PCR 11/19/2023 NEGATIVE  NEGATIVE Final   Influenza B by PCR 11/19/2023 NEGATIVE  NEGATIVE Final   Comment: (NOTE) The Xpert Xpress SARS-CoV-2/FLU/RSV plus assay is intended as an aid in the diagnosis of influenza from Nasopharyngeal swab specimens and should not be used as a sole basis for treatment. Nasal washings and aspirates are unacceptable for Xpert Xpress SARS-CoV-2/FLU/RSV testing.  Fact Sheet for Patients: BloggerCourse.com  Fact Sheet for Healthcare Providers: SeriousBroker.it  This test is not yet approved or cleared by the [place] FDA and has been authorized for detection and/or diagnosis of SARS-CoV-2 by FDA under an Emergency Use Authorization (EUA). This EUA will remain in effect (meaning this test can be used) for the duration of the COVID-19 declaration under Section 564(b)(1) of the Act, 21 U.S.C. section 360bbb-3(b)(1), unless the authorization is terminated or revoked.     Resp Syncytial Virus by PCR 11/19/2023 NEGATIVE  NEGATIVE Final   Comment: (NOTE) Fact Sheet for Patients: BloggerCourse.com  Fact Sheet for Healthcare Providers: SeriousBroker.it  This test is not yet approved or cleared by the [place] FDA and has been authorized for detection and/or diagnosis of SARS-CoV-2 by FDA under an Emergency Use Authorization (EUA). This EUA will remain in effect (meaning this test can be used) for the duration of the COVID-19 declaration under Section 564(b)(1) of the Act, 21 U.S.C. section 360bbb-3(b)(1), unless the authorization is terminated or revoked.  Performed at [facility], [address] [place], [place] [address]   Admission on 11/09/2023, Discharged on 11/09/2023  Component Date Value Ref Range Status   Color, Urine 11/09/2023 AMBER (A)  YELLOW Final   BIOCHEMICALS MAY BE AFFECTED BY COLOR   APPearance 11/09/2023 CLEAR  CLEAR Final   Specific Gravity, Urine 11/09/2023 1.036 (H)  1.005 - 1.030 Final   pH 11/09/2023 5.0  5.0 - 8.0 Final   Glucose, UA 11/09/2023  NEGATIVE  NEGATIVE mg/dL Final   Hgb urine dipstick 11/09/2023 NEGATIVE  NEGATIVE Final   Bilirubin Urine 11/09/2023 SMALL (A)  NEGATIVE Final   Ketones, ur 11/09/2023 NEGATIVE  NEGATIVE mg/dL Final   Protein, ur 16/07/9603 30 (A)  NEGATIVE mg/dL Final   Nitrite 54/06/8118 NEGATIVE  NEGATIVE Final   Leukocytes,Ua 11/09/2023 NEGATIVE  NEGATIVE Final   RBC / HPF 11/09/2023 0-5  0 - 5 RBC/hpf Final   WBC, UA 11/09/2023 0-5  0 - 5 WBC/hpf Final   Bacteria, UA 11/09/2023 NONE SEEN  NONE SEEN Final   Squamous Epithelial / HPF 11/09/2023 0-5  0 - 5 /HPF Final   Mucus 11/09/2023 PRESENT   Final   Performed at [facility], [address], [place], [place] [address]   Opiates 11/09/2023 POSITIVE (A)  NONE DETECTED Final   Cocaine 11/09/2023 POSITIVE (A)  NONE DETECTED Final   Benzodiazepines 11/09/2023 POSITIVE (A)  NONE DETECTED Final   Amphetamines 11/09/2023 NONE DETECTED  NONE DETECTED Final   Tetrahydrocannabinol 11/09/2023 POSITIVE (A)  NONE DETECTED Final   Barbiturates 11/09/2023 NONE DETECTED  NONE DETECTED Final   Comment: (NOTE) DRUG SCREEN FOR MEDICAL PURPOSES ONLY.  IF CONFIRMATION IS NEEDED FOR ANY PURPOSE, NOTIFY LAB WITHIN 5 DAYS.  LOWEST DETECTABLE LIMITS FOR URINE DRUG SCREEN Drug Class                     Cutoff (ng/mL) Amphetamine and metabolites    1000 Barbiturate and metabolites    200 Benzodiazepine                 200 Opiates and metabolites        300 Cocaine and metabolites        300 THC                            50 Performed at  [facility], [address], [place], [place] [address]   Admission on 08/10/2023, Discharged on 08/14/2023  Component Date Value Ref Range Status   Sodium 08/10/2023 137  135 - 145 mmol/L Final   Potassium 08/10/2023 3.4 (L)  3.5 - 5.1 mmol/L Final   Chloride 08/10/2023 100  98 - 111 mmol/L Final   CO2 08/10/2023 25  22 - 32 mmol/L Final   Glucose, Bld 08/10/2023 109 (H)  70 - 99 mg/dL Final   Glucose reference range applies only to samples taken after fasting for at least 8 hours.   BUN 08/10/2023 11  6 - 20 mg/dL Final   Creatinine, Ser 08/10/2023 0.71  0.61 - 1.24 mg/dL Final   Calcium 30/86/5784 8.8 (L)  8.9 - 10.3 mg/dL Final   Total Protein 69/62/9528 7.9  6.5 - 8.1 g/dL Final   Albumin 41/32/4401 3.8  3.5 - 5.0 g/dL Final   AST 02/72/5366 466 (H)  15 - 41 U/L Final   ALT 08/10/2023 264 (H)  0 - 44 U/L Final   Alkaline Phosphatase 08/10/2023 112  38 - 126 U/L Final   Total Bilirubin 08/10/2023 1.2 (H)  <1.2 mg/dL Final   GFR, Estimated 08/10/2023 >60  >60 mL/min Final   Comment: (NOTE) Calculated using the CKD-EPI Creatinine Equation (2021)    Anion gap 08/10/2023 12  5 - 15 Final   Performed at [facility], [address], [place], [place] [address]   Alcohol, Ethyl (B) 08/10/2023 144 (H)  <10  mg/dL Final   Comment: (NOTE) Lowest detectable limit for serum alcohol is 10 mg/dL.  For medical purposes only. Performed at [facility], [address], [place], [place] [address]    Salicylate Lvl 08/10/2023 <7.0 (L)  7.0 - 30.0 mg/dL Final   Performed at [facility], [address], [place], [place] [address]   Acetaminophen (Tylenol), Serum 08/10/2023 <10 (L)  10 - 30 ug/mL Final   Comment: (NOTE) Therapeutic concentrations vary significantly. A range of 10-30 ug/mL  may be an effective concentration for many patients. However, some  are best treated at concentrations outside of this  range. Acetaminophen concentrations >150 ug/mL at 4 hours after ingestion  and >50 ug/mL at 12 hours after ingestion are often associated with  toxic reactions.  Performed at [facility], [address], [place], [place] [address]    WBC 08/10/2023 8.0  4.0 - 10.5 K/uL Final   RBC 08/10/2023 4.13 (L)  4.22 - 5.81 MIL/uL Final   Hemoglobin 08/10/2023 13.8  13.0 - 17.0 g/dL Final   HCT 96/29/5284 39.4  39.0 - 52.0 % Final   MCV 08/10/2023 95.4  80.0 - 100.0 fL Final   MCH 08/10/2023 33.4  26.0 - 34.0 pg Final   MCHC 08/10/2023 35.0  30.0 - 36.0 g/dL Final   RDW 13/24/4010 13.1  11.5 - 15.5 % Final   Platelets 08/10/2023 226  150 - 400 K/uL Final   nRBC 08/10/2023 0.0  0.0 - 0.2 % Final   Performed at [facility], [address] [place], [place] [address]   Opiates 08/10/2023 NONE DETECTED  NONE DETECTED Final   Cocaine 08/10/2023 POSITIVE (A)  NONE DETECTED Final   Benzodiazepines 08/10/2023 NONE DETECTED  NONE DETECTED Final   Amphetamines 08/10/2023 POSITIVE (A)  NONE DETECTED Final   Tetrahydrocannabinol 08/10/2023 POSITIVE (A)  NONE DETECTED Final   Barbiturates 08/10/2023 NONE DETECTED  NONE DETECTED Final   Comment: (NOTE) DRUG SCREEN FOR MEDICAL PURPOSES ONLY.  IF CONFIRMATION IS NEEDED FOR ANY PURPOSE, NOTIFY LAB WITHIN 5 DAYS.  LOWEST DETECTABLE LIMITS FOR URINE DRUG SCREEN Drug Class                     Cutoff (ng/mL) Amphetamine and metabolites    1000 Barbiturate and metabolites    200 Benzodiazepine                 200 Opiates and metabolites        300 Cocaine and metabolites        300 THC                            50 Performed at [facility], [address] [place], [place] [address]    Total CK 08/10/2023 461 (H)  49 - 397 U/L Final   Performed at [facility], [address] [place], [place] [address]   Hepatitis B Surface Ag 08/11/2023 NON REACTIVE  NON REACTIVE Final    HCV Ab 08/11/2023 Reactive (A)  NON REACTIVE Final   Comment: (NOTE) The CDC recommends that a Reactive HCV antibody result be followed up  with a HCV Nucleic Acid Amplification test.     Hep A IgM 08/11/2023 NON REACTIVE  NON REACTIVE Final   Hep B C IgM 08/11/2023 NON REACTIVE  NON REACTIVE Final   Performed at [facility], [address], [place], [place] [address]   Total Protein 08/12/2023 7.4  6.5 - 8.1 g/dL Final   Albumin 60/45/4098 3.2 (L)  3.5 - 5.0 g/dL Final   AST 11/91/4782 381 (H)  15 - 41 U/L Final   ALT 08/12/2023 247 (H)  0 - 44 U/L Final   Alkaline Phosphatase 08/12/2023 97  38 - 126 U/L Final   Total Bilirubin 08/12/2023 1.0  <1.2 mg/dL Final   Bilirubin, Direct 08/12/2023 0.2  0.0 - 0.2 mg/dL Final   Indirect Bilirubin 08/12/2023 0.8  0.3 - 0.9 mg/dL Final   Performed at [facility], [address] [place], [place] [address]   Total CK 08/12/2023 41 (L)  49 - 397 U/L Final   Performed at [facility], [address], [place], [place] [address]   Ammonia 08/12/2023 48 (H)  9 - 35 umol/L Final   Performed at [facility], [address], [place], [place] [address]  Admission on 08/04/2023, Discharged on 08/07/2023  Component Date Value Ref Range Status   Amphetamines, Urine 08/04/2023 Negative  Cutoff=1000 ng/mL Final   Amphetamine test includes Amphetamine and Methamphetamine.   Barbiturate, Ur 08/04/2023 Negative  Cutoff=300 ng/mL Final   Benzodiazepine Quant, Ur 08/04/2023 Negative  Cutoff=300 ng/mL Final   Cannabinoid Quant, Ur 08/04/2023 Negative  Cutoff=50 ng/mL Final   Cocaine (Metab.) 08/04/2023 See Final Results  Cutoff=300 ng/mL Final   Opiate Quant, Ur 08/04/2023 Negative  Cutoff=300 ng/mL Final   Opiate test includes Codeine and Morphine only.   Phencyclidine, Ur 08/04/2023 Negative  Cutoff=25 ng/mL Final   Methadone Screen, Urine 08/04/2023 Negative  Cutoff=300 ng/mL Final    Propoxyphene, Urine 08/04/2023 Negative  Cutoff=300 ng/mL Final   Ethanol U, Cloretta Ned 08/04/2023 See Final Results  Cutoff=0.020 % Final   Comment: (NOTE) Performed At: [facility] [address] [place], [place] [id number] Avis Epley PhD [id number]    WBC 08/04/2023 5.8  4.0 - 10.5 K/uL Final   RBC 08/04/2023 3.93 (L)  4.22 - 5.81 MIL/uL Final   Hemoglobin 08/04/2023 12.9 (L)  13.0 - 17.0 g/dL Final   HCT 64/40/3474 36.4 (L)  39.0 - 52.0 % Final   MCV 08/04/2023 92.6  80.0 - 100.0 fL Final   MCH 08/04/2023 32.8  26.0 - 34.0 pg Final   MCHC 08/04/2023 35.4  30.0 - 36.0 g/dL Final   RDW 25/95/6387 12.7  11.5 - 15.5 % Final   Platelets 08/04/2023 147 (L)  150 - 400 K/uL Final   Comment: SPECIMEN CHECKED FOR CLOTS REPEATED TO VERIFY PLATELET COUNT CONFIRMED BY SMEAR    nRBC 08/04/2023 0.0  0.0 - 0.2 % Final   Neutrophils Relative % 08/04/2023 76  % Final   Neutro Abs 08/04/2023 4.4  1.7 - 7.7 K/uL Final   Lymphocytes Relative 08/04/2023 16  % Final   Lymphs Abs 08/04/2023 0.9  0.7 - 4.0 K/uL Final   Monocytes Relative 08/04/2023 6  % Final   Monocytes Absolute 08/04/2023 0.3  0.1 - 1.0 K/uL Final   Eosinophils Relative 08/04/2023 0  % Final   Eosinophils Absolute 08/04/2023 0.0  0.0 - 0.5 K/uL Final   Basophils Relative 08/04/2023 1  % Final   Basophils Absolute 08/04/2023 0.0  0.0 - 0.1 K/uL Final   Immature Granulocytes 08/04/2023 1  % Final   Abs Immature Granulocytes 08/04/2023 0.04  0.00 - 0.07 K/uL Final   Performed at [facility], [address], [place], [place] [address]   Sodium 08/04/2023 134 (L)  135 - 145 mmol/L Final   Potassium 08/04/2023 3.0 (L)  3.5 - 5.1 mmol/L Final   Chloride 08/04/2023 95 (L)  98 - 111 mmol/L Final   CO2 08/04/2023 24  22 - 32 mmol/L Final   Glucose, Bld 08/04/2023 115 (H)  70 - 99 mg/dL Final   Glucose reference range applies only to samples taken after fasting for at least 8 hours.   BUN 08/04/2023 5 (L)  6 - 20 mg/dL Final    Creatinine, Ser 08/04/2023 0.56 (L)  0.61 - 1.24 mg/dL Final   Calcium 19/14/7829 8.2 (L)  8.9 - 10.3 mg/dL Final   Total Protein 56/21/3086 7.7  6.5 - 8.1 g/dL Final   Albumin 57/84/6962 3.6  3.5 - 5.0 g/dL Final   AST 95/28/4132 205 (H)  15 - 41 U/L Final   ALT 08/04/2023 99 (H)  0 - 44 U/L Final   Alkaline Phosphatase 08/04/2023 98  38 - 126 U/L Final   Total Bilirubin 08/04/2023 1.0  0.3 - 1.2 mg/dL Final   GFR, Estimated 08/04/2023 >60  >60 mL/min Final   Comment: (NOTE) Calculated using the CKD-EPI Creatinine Equation (2021)    Anion gap 08/04/2023 15  5 - 15 Final   Performed at [facility], [address] [place], [place] [address]   Hgb A1c MFr Bld 08/04/2023 5.4  4.8 - 5.6 % Final   Comment: (NOTE) Pre diabetes:          5.7%-6.4%  Diabetes:              >6.4%  Glycemic control for   <7.0% adults with diabetes    Mean Plasma Glucose 08/04/2023 108.28  mg/dL Final   Performed at [facility], [address] [place], [place] [address]   TSH 08/04/2023 0.613  0.350 - 4.500 uIU/mL Final   Comment: Performed by a 3rd Generation assay with a functional sensitivity of <=0.01 uIU/mL. Performed at [facility], [address], [place], [place] [address]    Neisseria Gonorrhea 08/04/2023 Negative   Final   Chlamydia 08/04/2023 Negative   Final   Comment 08/04/2023 Normal Reference Ranger Chlamydia - Negative   Final   Comment 08/04/2023 Normal Reference Range Neisseria Gonorrhea - Negative   Final   RPR Ser Ql 08/04/2023 NON REACTIVE  NON REACTIVE Final   Performed at [facility], [address] [place], [place] [address]   HIV-1 P24 Antigen - HIV24 08/04/2023 NON REACTIVE  NON REACTIVE Final   Comment: (NOTE) Detection of p24 may be inhibited by biotin in the sample, causing false negative results in acute infection.    HIV 1/2 Antibodies 08/04/2023 NON REACTIVE  NON REACTIVE Final   Interpretation (HIV Ag Ab) 08/04/2023 A non reactive test  result means that HIV 1 or HIV 2 antibodies and HIV 1 p24 antigen were not detected in the specimen.   Final   Performed at [facility], [address], [place], [place] [address]   Hepatitis B Surface Ag 08/04/2023 NON REACTIVE  NON REACTIVE Final   HCV Ab 08/04/2023 Reactive (A)  NON REACTIVE Final   Comment: (NOTE) The CDC recommends that a Reactive HCV antibody result be followed up  with a HCV Nucleic Acid Amplification test.     Hep A IgM 08/04/2023 NON REACTIVE  NON REACTIVE Final   Hep  B C IgM 08/04/2023 NON REACTIVE  NON REACTIVE Final   Performed at [facility], [address] [place], [place] [address]   POC Amphetamine UR 08/04/2023 None Detected  NONE DETECTED (Cut Off Level 1000 ng/mL) Final   POC Secobarbital (BAR) 08/04/2023 None Detected  NONE DETECTED (Cut Off Level 300 ng/mL) Final   POC Buprenorphine (BUP) 08/04/2023 None Detected  NONE DETECTED (Cut Off Level 10 ng/mL) Final   POC Oxazepam (BZO) 08/04/2023 None Detected  NONE DETECTED (Cut Off Level 300 ng/mL) Final   POC Cocaine UR 08/04/2023 Positive (A)  NONE DETECTED (Cut Off Level 300 ng/mL) Final   POC Methamphetamine UR 08/04/2023 None Detected  NONE DETECTED (Cut Off Level 1000 ng/mL) Final   POC Morphine 08/04/2023 None Detected  NONE DETECTED (Cut Off Level 300 ng/mL) Final   POC Methadone UR 08/04/2023 None Detected  NONE DETECTED (Cut Off Level 300 ng/mL) Final   POC Oxycodone UR 08/04/2023 None Detected  NONE DETECTED (Cut Off Level 100 ng/mL) Final   POC Marijuana UR 08/04/2023 Positive (A)  NONE DETECTED (Cut Off Level 50 ng/mL) Final   Cocaine Metab Quant, Ur 08/04/2023 Positive (A)  Cutoff=300 Final   Benzoylecgonine GC/MS Conf 08/04/2023 35,080  Cutoff=150 ng/mL Final   Comment: (NOTE) Performed At: [facility] [address] [place], [place] [id number] Avis Epley PhD [id number]    Ethanol, Urine 08/04/2023 FID) (A)  Cutoff=0.020 Final   Comment: Positive Confirmation  performed by Gas Chromatography (GC    Ethanol, Ur - Confirmation 08/04/2023 0.099  Cutoff=0.020 % Final   Comment: (NOTE) GLUCOSE NEGATIVE Performed At: [facility] [address] [place], [place] [id number] Avis Epley PhD UY:[id number]   Admission on 06/15/2023, Discharged on 06/16/2023  Component Date Value Ref Range Status   WBC 06/15/2023 5.8  4.0 - 10.5 K/uL Final   RBC 06/15/2023 4.61  4.22 - 5.81 MIL/uL Final   Hemoglobin 06/15/2023 15.5  13.0 - 17.0 g/dL Final   HCT 63/87/5643 46.0  39.0 - 52.0 % Final   MCV 06/15/2023 99.8  80.0 - 100.0 fL Final   MCH 06/15/2023 33.6  26.0 - 34.0 pg Final   MCHC 06/15/2023 33.7  30.0 - 36.0 g/dL Final   RDW 32/95/1884 13.3  11.5 - 15.5 % Final   Platelets 06/15/2023 244  150 - 400 K/uL Final   nRBC 06/15/2023 0.0  0.0 - 0.2 % Final   Neutrophils Relative % 06/15/2023 56  % Final   Neutro Abs 06/15/2023 3.3  1.7 - 7.7 K/uL Final   Lymphocytes Relative 06/15/2023 33  % Final   Lymphs Abs 06/15/2023 1.9  0.7 - 4.0 K/uL Final   Monocytes Relative 06/15/2023 6  % Final   Monocytes Absolute 06/15/2023 0.4  0.1 - 1.0 K/uL Final   Eosinophils Relative 06/15/2023 2  % Final   Eosinophils Absolute 06/15/2023 0.1  0.0 - 0.5 K/uL Final   Basophils Relative 06/15/2023 3  % Final   Basophils Absolute 06/15/2023 0.2 (H)  0.0 - 0.1 K/uL Final   Immature Granulocytes 06/15/2023 0  % Final   Abs Immature Granulocytes 06/15/2023 0.01  0.00 - 0.07 K/uL Final   Performed at [facility], [address] [place], [place] [address]   Sodium 06/15/2023 140  135 - 145 mmol/L Final   Potassium 06/15/2023 3.8  3.5 - 5.1 mmol/L Final   Chloride 06/15/2023 103  98 - 111 mmol/L Final  CO2 06/15/2023 28  22 - 32 mmol/L Final   Glucose, Bld 06/15/2023 108 (H)  70 - 99 mg/dL Final   Glucose reference range applies only to samples taken after fasting for at least 8 hours.   BUN 06/15/2023 5 (L)  6 - 20 mg/dL Final   Creatinine, Ser  06/15/2023 0.65  0.61 - 1.24 mg/dL Final   Calcium 16/07/9603 8.3 (L)  8.9 - 10.3 mg/dL Final   Total Protein 54/06/8118 8.6 (H)  6.5 - 8.1 g/dL Final   Albumin 14/78/2956 3.6  3.5 - 5.0 g/dL Final   AST 21/30/8657 296 (H)  15 - 41 U/L Final   ALT 06/15/2023 205 (H)  0 - 44 U/L Final   Alkaline Phosphatase 06/15/2023 145 (H)  38 - 126 U/L Final   Total Bilirubin 06/15/2023 0.4  0.3 - 1.2 mg/dL Final   GFR, Estimated 06/15/2023 >60  >60 mL/min Final   Comment: (NOTE) Calculated using the CKD-EPI Creatinine Equation (2021)    Anion gap 06/15/2023 9  5 - 15 Final   Performed at [facility], [address], [place], [place] [address]   Lipase 06/15/2023 27  11 - 51 U/L Final   Performed at [facility], [address], [place], [place] [address]   Specimen Source 06/16/2023 URINE, CLEAN CATCH   Final   Color, Urine 06/16/2023 YELLOW  YELLOW Final   APPearance 06/16/2023 HAZY (A)  CLEAR Final   Specific Gravity, Urine 06/16/2023 1.015  1.005 - 1.030 Final   pH 06/16/2023 6.0  5.0 - 8.0 Final   Glucose, UA 06/16/2023 NEGATIVE  NEGATIVE mg/dL Final   Hgb urine dipstick 06/16/2023 NEGATIVE  NEGATIVE Final   Bilirubin Urine 06/16/2023 NEGATIVE  NEGATIVE Final   Ketones, ur 06/16/2023 NEGATIVE  NEGATIVE mg/dL Final   Protein, ur 41/32/4401 NEGATIVE  NEGATIVE mg/dL Final   Nitrite 02/72/5366 NEGATIVE  NEGATIVE Final   Leukocytes,Ua 06/16/2023 NEGATIVE  NEGATIVE Final   RBC / HPF 06/16/2023 0-5  0 - 5 RBC/hpf Final   WBC, UA 06/16/2023 0-5  0 - 5 WBC/hpf Final   Comment:        Reflex urine culture not performed if WBC <=10, OR if Squamous epithelial cells >5. If Squamous epithelial cells >5 suggest recollection.    Bacteria, UA 06/16/2023 NONE SEEN  NONE SEEN Final   Squamous Epithelial / HPF 06/16/2023 0-5  0 - 5 /HPF Final   Mucus 06/16/2023 PRESENT   Final   Performed at [facility], [address], [place], [place] [address]    Alcohol, Ethyl (B) 06/15/2023 288 (H)  <10 mg/dL Final   Comment: (NOTE) Lowest detectable limit for serum alcohol is 10 mg/dL.  For medical purposes only. Performed at [facility], [address], [place], [place] [address]    Salicylate Lvl 06/15/2023 <7.0 (L)  7.0 - 30.0 mg/dL Final   Performed at [facility], [address], [place], [place] [address]   Acetaminophen (Tylenol), Serum 06/15/2023 <10 (L)  10 - 30 ug/mL Final   Comment: (NOTE) Therapeutic concentrations vary significantly. A range of 10-30 ug/mL  may be an effective concentration for many patients. However, some  are best treated at concentrations outside of this range. Acetaminophen concentrations >150 ug/mL at 4 hours after ingestion  and >50 ug/mL at 12 hours after ingestion are often associated with  toxic reactions.  Performed at [facility], [address] [place], [place] [address]    Opiates 06/16/2023 NONE DETECTED  NONE DETECTED Final   Cocaine 06/16/2023 POSITIVE (A)  NONE DETECTED Final   Benzodiazepines 06/16/2023 NONE DETECTED  NONE DETECTED Final   Amphetamines 06/16/2023 NONE DETECTED  NONE DETECTED Final   Tetrahydrocannabinol 06/16/2023 POSITIVE (A)  NONE DETECTED Final   Barbiturates 06/16/2023 NONE DETECTED  NONE DETECTED Final   Comment: (NOTE) DRUG SCREEN FOR MEDICAL PURPOSES ONLY.  IF CONFIRMATION IS NEEDED FOR ANY PURPOSE, NOTIFY LAB WITHIN 5 DAYS.  LOWEST DETECTABLE LIMITS FOR URINE DRUG SCREEN Drug Class                     Cutoff (ng/mL) Amphetamine and metabolites    1000 Barbiturate and metabolites    200 Benzodiazepine                 200 Opiates and metabolites        300 Cocaine and metabolites        300 THC                            50 Performed at [facility], [address], [place], [place] [address]   Admission on 06/09/2023, Discharged on 06/10/2023  Component Date Value Ref Range  Status   Lipase 06/09/2023 38  11 - 51 U/L Final   Performed at [facility], [address], [place], [place] [address]   Sodium 06/09/2023 137  135 - 145 mmol/L Final   Potassium 06/09/2023 3.6  3.5 - 5.1 mmol/L Final   Chloride 06/09/2023 100  98 - 111 mmol/L Final   CO2 06/09/2023 25  22 - 32 mmol/L Final   Glucose, Bld 06/09/2023 140 (H)  70 - 99 mg/dL Final   Glucose reference range applies only to samples taken after fasting for at least 8 hours.   BUN 06/09/2023 14  6 - 20 mg/dL Final   Creatinine, Ser 06/09/2023 0.75  0.61 - 1.24 mg/dL Final   Calcium 91/47/8295 8.6 (L)  8.9 - 10.3 mg/dL Final   Total Protein 62/13/0865 8.4 (H)  6.5 - 8.1 g/dL Final   Albumin 78/46/9629 3.6  3.5 - 5.0 g/dL Final   AST 52/84/1324 222 (H)  15 - 41 U/L Final   ALT 06/09/2023 176 (H)  0 - 44 U/L Final   Alkaline Phosphatase 06/09/2023 183 (H)  38 - 126 U/L Final   Total Bilirubin 06/09/2023 0.7  0.3 - 1.2 mg/dL Final   GFR, Estimated 06/09/2023 >60  >60 mL/min Final   Comment: (NOTE) Calculated using the CKD-EPI Creatinine Equation (2021)    Anion gap 06/09/2023 12  5 - 15 Final   Performed at [facility], [address] [place], [place] [address]   WBC 06/09/2023 10.3  4.0 - 10.5 K/uL Final   RBC 06/09/2023 4.30  4.22 - 5.81 MIL/uL Final   Hemoglobin 06/09/2023 14.7  13.0 - 17.0 g/dL Final   HCT 72/53/6644 42.1  39.0 - 52.0 % Final   MCV 06/09/2023 97.9  80.0 - 100.0 fL Final   MCH 06/09/2023 34.2 (H)  26.0 - 34.0 pg Final   MCHC 06/09/2023 34.9  30.0 - 36.0 g/dL Final   RDW 03/47/4259 12.8  11.5 - 15.5 % Final   Platelets 06/09/2023 315  150 - 400 K/uL Final   nRBC 06/09/2023 0.0  0.0 - 0.2 % Final  Performed at [facility], [address] [place], [place] [address]   Color, Urine 06/10/2023 YELLOW  YELLOW Final   APPearance 06/10/2023 CLEAR  CLEAR Final   Specific Gravity, Urine 06/10/2023 1.010  1.005 - 1.030 Final   pH  06/10/2023 5.0  5.0 - 8.0 Final   Glucose, UA 06/10/2023 NEGATIVE  NEGATIVE mg/dL Final   Hgb urine dipstick 06/10/2023 NEGATIVE  NEGATIVE Final   Bilirubin Urine 06/10/2023 NEGATIVE  NEGATIVE Final   Ketones, ur 06/10/2023 NEGATIVE  NEGATIVE mg/dL Final   Protein, ur 60/45/4098 NEGATIVE  NEGATIVE mg/dL Final   Nitrite 11/91/4782 NEGATIVE  NEGATIVE Final   Leukocytes,Ua 06/10/2023 NEGATIVE  NEGATIVE Final   Performed at [facility], [address] [place], [place] [address]  Admission on 06/09/2023, Discharged on 06/09/2023  Component Date Value Ref Range Status   Opiates 06/09/2023 NONE DETECTED  NONE DETECTED Final   Cocaine 06/09/2023 POSITIVE (A)  NONE DETECTED Final   Benzodiazepines 06/09/2023 NONE DETECTED  NONE DETECTED Final   Amphetamines 06/09/2023 NONE DETECTED  NONE DETECTED Final   Tetrahydrocannabinol 06/09/2023 POSITIVE (A)  NONE DETECTED Final   Barbiturates 06/09/2023 NONE DETECTED  NONE DETECTED Final   Comment: (NOTE) DRUG SCREEN FOR MEDICAL PURPOSES ONLY.  IF CONFIRMATION IS NEEDED FOR ANY PURPOSE, NOTIFY LAB WITHIN 5 DAYS.  LOWEST DETECTABLE LIMITS FOR URINE DRUG SCREEN Drug Class                     Cutoff (ng/mL) Amphetamine and metabolites    1000 Barbiturate and metabolites    200 Benzodiazepine                 200 Opiates and metabolites        300 Cocaine and metabolites        300 THC                            50 Performed at [facility], [address], [place], [place] [address]    Color, Urine 06/09/2023 YELLOW  YELLOW Final   APPearance 06/09/2023 CLEAR  CLEAR Final   Specific Gravity, Urine 06/09/2023 1.016  1.005 - 1.030 Final   pH 06/09/2023 8.0  5.0 - 8.0 Final   Glucose, UA 06/09/2023 NEGATIVE  NEGATIVE mg/dL Final   Hgb urine dipstick 06/09/2023 NEGATIVE  NEGATIVE Final   Bilirubin Urine 06/09/2023 NEGATIVE  NEGATIVE Final   Ketones, ur 06/09/2023 NEGATIVE  NEGATIVE mg/dL Final   Protein, ur  78/46/9629 NEGATIVE  NEGATIVE mg/dL Final   Nitrite 52/84/1324 NEGATIVE  NEGATIVE Final   Leukocytes,Ua 06/09/2023 NEGATIVE  NEGATIVE Final   Performed at [facility], [address], [place], [place] [address]  Admission on 06/03/2023, Discharged on 06/05/2023  Component Date Value Ref Range Status   WBC 06/03/2023 6.4  4.0 - 10.5 K/uL Final   RBC 06/03/2023 3.88 (L)  4.22 - 5.81 MIL/uL Final   Hemoglobin 06/03/2023 13.3  13.0 - 17.0 g/dL Final   HCT 72/53/6644 38.0 (L)  39.0 - 52.0 % Final   MCV 06/03/2023 97.9  80.0 - 100.0 fL Final   MCH 06/03/2023 34.3 (H)  26.0 - 34.0 pg Final   MCHC 06/03/2023 35.0  30.0 - 36.0 g/dL Final   RDW 03/47/4259 13.0  11.5 - 15.5 % Final   Platelets 06/03/2023 233  150 - 400 K/uL Final   nRBC 06/03/2023 0.0  0.0 - 0.2 % Final   Neutrophils Relative % 06/03/2023 49  % Final   Neutro Abs 06/03/2023 3.2  1.7 - 7.7 K/uL Final   Lymphocytes Relative 06/03/2023 40  % Final   Lymphs Abs 06/03/2023 2.6  0.7 - 4.0 K/uL Final   Monocytes Relative 06/03/2023 7  % Final   Monocytes Absolute 06/03/2023 0.4  0.1 - 1.0 K/uL Final   Eosinophils Relative 06/03/2023 2  % Final   Eosinophils Absolute 06/03/2023 0.1  0.0 - 0.5 K/uL Final   Basophils Relative 06/03/2023 2  % Final   Basophils Absolute 06/03/2023 0.1  0.0 - 0.1 K/uL Final   Immature Granulocytes 06/03/2023 0  % Final   Abs Immature Granulocytes 06/03/2023 0.02  0.00 - 0.07 K/uL Final   Performed at [facility], [address], [place], [place] [address]   Sodium 06/03/2023 140  135 - 145 mmol/L Final   Potassium 06/03/2023 3.4 (L)  3.5 - 5.1 mmol/L Final   Chloride 06/03/2023 104  98 - 111 mmol/L Final   CO2 06/03/2023 26  22 - 32 mmol/L Final   Glucose, Bld 06/03/2023 112 (H)  70 - 99 mg/dL Final   Glucose reference range applies only to samples taken after fasting for at least 8 hours.   BUN 06/03/2023 5 (L)  6 - 20 mg/dL Final   Creatinine, Ser 06/03/2023 1.04  0.61 - 1.24  mg/dL Final   Calcium 60/45/4098 8.3 (L)  8.9 - 10.3 mg/dL Final   Total Protein 11/91/4782 7.0  6.5 - 8.1 g/dL Final   Albumin 95/62/1308 3.2 (L)  3.5 - 5.0 g/dL Final   AST 65/78/4696 258 (H)  15 - 41 U/L Final   ALT 06/03/2023 186 (H)  0 - 44 U/L Final   Alkaline Phosphatase 06/03/2023 107  38 - 126 U/L Final   Total Bilirubin 06/03/2023 0.8  0.3 - 1.2 mg/dL Final   GFR, Estimated 06/03/2023 >60  >60 mL/min Final   Comment: (NOTE) Calculated using the CKD-EPI Creatinine Equation (2021)    Anion gap 06/03/2023 10  5 - 15 Final   Performed at [facility], [address] [place], [place] [address]   Alcohol, Ethyl (B) 06/03/2023 285 (H)  <10 mg/dL Final   Comment: (NOTE) Lowest detectable limit for serum alcohol is 10 mg/dL.  For medical purposes only. Performed at [facility], [address], [place], [place] [address]    POC Amphetamine UR 06/03/2023 None Detected  NONE DETECTED (Cut Off Level 1000 ng/mL) Final   POC Secobarbital (BAR) 06/03/2023 None Detected  NONE DETECTED (Cut Off Level 300 ng/mL) Final   POC Buprenorphine (BUP) 06/03/2023 None Detected  NONE DETECTED (Cut Off Level 10 ng/mL) Final   POC Oxazepam (BZO) 06/03/2023 None Detected  NONE DETECTED (Cut Off Level 300 ng/mL) Final   POC Cocaine UR 06/03/2023 Positive (A)  NONE DETECTED (Cut Off Level 300 ng/mL) Final   POC Methamphetamine UR 06/03/2023 None Detected  NONE DETECTED (Cut Off Level 1000 ng/mL) Final   POC Morphine 06/03/2023 Positive (A)  NONE DETECTED (Cut Off Level 300 ng/mL) Final   POC Methadone UR 06/03/2023 None Detected  NONE DETECTED (Cut Off Level 300 ng/mL) Final   POC Oxycodone UR 06/03/2023 None Detected  NONE DETECTED (Cut Off Level 100 ng/mL) Final   POC Marijuana UR 06/03/2023 Positive (A)  NONE DETECTED (Cut Off Level 50 ng/mL) Final  Admission on 05/28/2023, Discharged on 05/28/2023  Component Date Value Ref Range Status   WBC 05/28/2023 11.2 (H)  4.0 - 10.5 K/uL Final    RBC 05/28/2023 4.18 (L)  4.22 - 5.81 MIL/uL Final   Hemoglobin 05/28/2023 14.3  13.0 - 17.0 g/dL Final   HCT 16/07/9603 41.5  39.0 - 52.0 % Final   MCV 05/28/2023 99.3  80.0 - 100.0 fL Final   MCH 05/28/2023 34.2 (H)  26.0 - 34.0 pg Final   MCHC 05/28/2023 34.5  30.0 - 36.0 g/dL Final   RDW 54/06/8118 13.2  11.5 - 15.5 % Final   Platelets 05/28/2023 244  150 - 400 K/uL Final   nRBC 05/28/2023 0.0  0.0 - 0.2 % Final   Neutrophils Relative % 05/28/2023 65  % Final   Neutro Abs 05/28/2023 7.3  1.7 - 7.7 K/uL Final   Lymphocytes Relative 05/28/2023 27  % Final   Lymphs Abs 05/28/2023 3.0  0.7 - 4.0 K/uL Final   Monocytes Relative 05/28/2023 6  % Final   Monocytes Absolute 05/28/2023 0.7  0.1 - 1.0 K/uL Final   Eosinophils Relative 05/28/2023 1  % Final   Eosinophils Absolute 05/28/2023 0.1  0.0 - 0.5 K/uL Final   Basophils Relative 05/28/2023 1  % Final   Basophils Absolute 05/28/2023 0.1  0.0 - 0.1 K/uL Final   Immature Granulocytes 05/28/2023 0  % Final   Abs Immature Granulocytes 05/28/2023 0.04  0.00 - 0.07 K/uL Final   Performed at [facility], [address], [place], [place] [address]   Sodium 05/28/2023 139  135 - 145 mmol/L Final   Potassium 05/28/2023 3.1 (L)  3.5 - 5.1 mmol/L Final   Chloride 05/28/2023 106  98 - 111 mmol/L Final   CO2 05/28/2023 22  22 - 32 mmol/L Final   Glucose, Bld 05/28/2023 100 (H)  70 - 99 mg/dL Final   Glucose reference range applies only to samples taken after fasting for at least 8 hours.   BUN 05/28/2023 6  6 - 20 mg/dL Final   Creatinine, Ser 05/28/2023 0.70  0.61 - 1.24 mg/dL Final   Calcium 95/62/1308 8.0 (L)  8.9 - 10.3 mg/dL Final   GFR, Estimated 05/28/2023 >60  >60 mL/min Final   Comment: (NOTE) Calculated using the CKD-EPI Creatinine Equation (2021)    Anion gap 05/28/2023 11  5 - 15 Final   Performed at [facility], [address] [place], [place] [address]   Alcohol, Ethyl (B) 05/28/2023 279 (H)  <10 mg/dL Final    Comment: (NOTE) Lowest detectable limit for serum alcohol is 10 mg/dL.  For medical purposes only. Performed at [facility], [address], [place], [place] [address]    Opiates 05/28/2023 POSITIVE (A)  NONE DETECTED Final   Cocaine 05/28/2023 POSITIVE (A)  NONE DETECTED Final   Benzodiazepines 05/28/2023 NONE DETECTED  NONE DETECTED Final   Amphetamines 05/28/2023 POSITIVE (A)  NONE DETECTED Final   Tetrahydrocannabinol 05/28/2023 POSITIVE (A)  NONE DETECTED Final   Barbiturates 05/28/2023 NONE DETECTED  NONE DETECTED Final   Comment: (NOTE) DRUG SCREEN FOR MEDICAL PURPOSES ONLY.  IF CONFIRMATION IS NEEDED FOR ANY PURPOSE, NOTIFY LAB WITHIN 5 DAYS.  LOWEST DETECTABLE LIMITS FOR URINE DRUG SCREEN Drug Class                     Cutoff (ng/mL) Amphetamine and metabolites    1000 Barbiturate and metabolites    200 Benzodiazepine  200 Opiates and metabolites        300 Cocaine and metabolites        300 THC                            50 Performed at [facility], [address], [place], [place] [address]   There may be more visits with results that are not included.    Blood Alcohol level:  Lab Results  Component Value Date   ETH <10 11/21/2023   ETH 144 (H) 08/10/2023    Metabolic Disorder Labs: Lab Results  Component Value Date   HGBA1C 5.4 08/04/2023   MPG 108.28 08/04/2023   MPG 111 02/24/2023   Lab Results  Component Value Date   PROLACTIN 31.4 (H) 11/16/2019   Lab Results  Component Value Date   CHOL 147 02/24/2023   TRIG 96 02/24/2023   HDL 42 02/24/2023   CHOLHDL 3.5 02/24/2023   VLDL 19 02/24/2023   LDLCALC 86 02/24/2023   LDLCALC 57 01/14/2023    Therapeutic Lab Levels: No results found for: "LITHIUM" No results found for: "VALPROATE" No results found for: "CBMZ"  Physical Findings   AIMS    Flowsheet Row [facility]) [facility] Ad[facility]  (Discharged) [facility] Ad[facility] (Dis[facility]  AIMS Total Score 0 0 0      AUDIT    Flowsheet Row [facility]) [facility] Ad[facility] (Discharged[facility] Ad[facility] (Discharged) from OP [facility] ([facility]charged[facility]  Alcohol Use Disorder Identification Test Final Score (AUDIT) 26 26 13  37      PHQ2-9    Flowsheet Row ED from 11/21/2023 in [facility] ED from 06/03/2023 in [facility]  PHQ-2 Total Score 6 0  PHQ-9 Total Score 22 --      Flowsheet Row ED from 11/21/2023 in [facility] Most recent reading at 11/22/2023 12:50 AM ED from 11/21/2023 in [facility] Most recent reading at 11/21/2023  7:45 PM ED from 11/21/2023 in [facility] Department at [facility] Most recent reading at 11/21/2023 11:06 AM  C-SSRS RISK CATEGORY No Risk High Risk No Risk        Musculoskeletal  Strength & Muscle Tone: within normal limits Gait & Station: normal Patient leans: Right  Psychiatric Specialty Exam  Presentation  General Appearance:  Appropriate for Environment; Fairly Groomed  Eye Contact: Fair  Speech: Clear and Coherent; Normal Rate  Speech Volume: Normal  Handedness: Right   Mood and Affect  Mood: depression, anxiety  Affect: Congruent   Thought Process  Thought Processes: Coherent; Linear  Descriptions of Associations:Intact  Orientation:Full (Time, Place and Person)  Thought Content:Logical  Diagnosis of Schizophrenia or Schizoaffective disorder in past: No  Duration of  Psychotic Symptoms: Less than six months   Hallucinations:Hallucinations: None  Ideas of Reference:None  Suicidal Thoughts:Suicidal Thoughts: No  Homicidal Thoughts:Homicidal Thoughts: No   Sensorium  Memory: Immediate Fair; Recent Fair; Remote Fair  Judgment: Fair  Insight: Fair   Chartered certified accountant: Fair  Attention Span: Fair  Recall: [employer]  of Knowledge: Fair  Language: Fair   Lexicographer Activity: Psychomotor Activity: Normal   Assets  Assets: Communication Skills; Desire for Improvement; Physical Health; Resilience   Sleep  Sleep: Sleep: Fair Number of Hours of Sleep: 6   Nutritional Assessment (For [facility] and [facility] admissions only) Has the patient had a weight loss or gain of 10 pounds or more in the last 3 months?: No Has the patient had a decrease in food intake/or appetite?: No Does the patient have dental problems?: No Does the patient have eating habits or behaviors that may be indicators of an eating disorder including binging or inducing vomiting?: No Has the patient recently lost weight without trying?: 0 Has the patient been eating poorly because of a decreased appetite?: 0 Malnutrition Screening Tool Score: 0    Physical Exam  Physical Exam Vitals and nursing note reviewed.  Constitutional:      General: He is not in acute distress. HENT:     Nose: No congestion.  Cardiovascular:     Rate and Rhythm: Normal rate.  Pulmonary:     Effort: No respiratory distress.  Neurological:     Mental Status: He is alert.  Psychiatric:        Attention and Perception: He does not perceive auditory or visual hallucinations.        Mood and Affect: Mood is anxious and depressed.        Behavior: Behavior normal. Behavior is not agitated or aggressive.        Thought Content: Thought content normal. Thought content is not paranoid or delusional. Thought content does not include homicidal or suicidal  ideation.        Judgment: Judgment normal.    Review of Systems  Constitutional:  Negative for chills and fever.  Cardiovascular:  Negative for chest pain and palpitations.  Gastrointestinal:  Positive for abdominal pain. Negative for nausea and vomiting.  Neurological:  Positive for tremors. Negative for dizziness and headaches.  Psychiatric/Behavioral:  Positive for depression and substance abuse. Negative for hallucinations and suicidal ideas. The patient is nervous/anxious.   All other systems reviewed and are negative.  Blood pressure 110/79, pulse 69, temperature 98.3 F (36.8 C), temperature source Oral, resp. rate 18, SpO2 100%. There is no height or weight on file to calculate BMI.  Treatment Plan Summary: Daily contact with patient to assess and evaluate symptoms and progress in treatment  #Alcohol Use Disorder -initiate CIWA protocol with chlordiazepoxide taper -Thiamine injection 100 mg once then multivitamin po daily  #History of Opoid Use - COWS protocol  PRNs -Acetaminophen 650 mg as needed q6hrs for mild pain -Maalox/Mylanta 30 ml as needed q4hrs for indigestion -dicyclomine 20 mg as needed q6hrs for abdominal cramping -hydroxyzine 25 mg as needed for anxiety -loperamide 2-4 mg as needed diarrhea -Magnesium Hydroxide suspension 30 ml as needed for constipation -Naproxen tab 500 mg BID as needed aching/pain -Methocarbamol tab 500mg  as need for muscle spasms  -Ondansetron disintegrating tab 4 mg q6hrs as needed for nausea  Labs reviewed and determined to be within normal limtis. UDS positive for BZD(received ativan while in the ED on 02/16). Albumin is low, receommend increase in protein intake.  Midely elevated AST/ALT-follow up with PCP, likely will return to normal following cessation of alcohol and illicit substances. UDS positive for BZD, Cocaine and THC.   Disposition: Patient meet criteria for inpatient rehab. LCSW to assist with placement.    Maryagnes Amos, FNP 11/22/2023 11:14 AM

## 2023-11-22 NOTE — ED Provider Notes (Incomplete)
Behavioral Health Progress Note  Date and Time: 11/22/2023 9:44 AM Name: Mark Hammond MRN:  [id number]  Subjective: "I feel sluggish and moody"   Mark Hammond, 33 y.o., male patient seen face to face by this provider and chart reviewed by Dr. Woodroe Mode on 11/22/2023.   HPI: Mark Hammond, 33 y/o male, with a history of MDD, schizoaffective disorder, polysubstance abuse, alcohol intoxication, intentional overdose.  Presented to [facility] voluntarily.  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  According to the patient he consume 3, 40 ounce beer and other liquor.  Patient reports he also used 2-1/2 g of cocaine has been using daily.  Patient stated he is unemployed, lives with his girlfriend.  Patient stated he was last hospitalized at [facility] for alcohol abuse and cocaine.  I review of patient records show multiple visits to different locations for the same issues.   Assessment: On evaluation today, the patient was observed lying in bed. He appears to be in no acute distress.  The patient endorses feeling "moody," describing it as a mix of low mood and anxiety. He reports that he typically experiences this mood shift during the first few days of detox. Despite this, he expresses motivation to enter inpatient rehabilitation and complete the program this time. He acknowledges previous rehab admissions that he did not complete. When asked what is different this time and what his main motivation is, he states, "I'm doing it for myself this time." He notes that on previous occasions, he felt somewhat "forced" into rehab.  The patient denies a history of seizures related to alcohol withdrawal or delirium tremens. However, he endorses mild tremors and abdominal pain at this time. He states that his last use of alcohol, as well as cocaine and methamphetamine, was two days ago.  He is alert, oriented 4, calm, cooperative, and attentive. His mood is depressed with a  congruent affect. Speech and behavior are normal. Objectively, there is no evidence of psychosis, mania, or delusional thinking. His thought process is coherent and goal-directed, with no signs of distractibility or preoccupation.  The patient endorses a prior suicide attempt by intentional overdose on fentanyl in December 2025. However, he denies current suicidal, self-harm, or homicidal ideation. He also denies psychotic symptoms and paranoia. He answers questions appropriately.  Diagnosis:  Final diagnoses:  Alcohol abuse  Polysubstance abuse (HCC)  Schizoaffective disorder, bipolar type (HCC)  H/O medication noncompliance    Total Time spent with patient: 30 minutes  Past Psychiatric History: Pt reports a history of MDD, Bipolar Disorder & schizophrenia Past Medical History: Denies significant medical history. Family Psychiatric  History: None reported Social History: Patient resides with his girlfriend and both are currently unemployed.   Sleep: Fair  Appetite:  Fair  Current Medications:  Current Facility-Administered Medications  Medication Dose Route Frequency Provider Last Rate Last Admin   acetaminophen (TYLENOL) tablet 650 mg  650 mg Oral Q6H PRN Sindy Guadeloupe, NP       alum & mag hydroxide-simeth (MAALOX/MYLANTA) 200-200-20 MG/5ML suspension 30 mL  30 mL Oral Q4H PRN Sindy Guadeloupe, NP       chlordiazePOXIDE (LIBRIUM) capsule 25 mg  25 mg Oral Q6H PRN Starkes-Perry, Juel Burrow, FNP       chlordiazePOXIDE (LIBRIUM) capsule 25 mg  25 mg Oral QID Maryagnes Amos, FNP       Followed by   Melene Muller ON 11/23/2023] chlordiazePOXIDE (LIBRIUM) capsule 25 mg  25 mg  Oral TID Maryagnes Amos, FNP       Followed by   Melene Muller ON 11/24/2023] chlordiazePOXIDE (LIBRIUM) capsule 25 mg  25 mg Oral BH-qamhs Starkes-Perry, Juel Burrow, FNP       Followed by   Melene Muller ON 11/25/2023] chlordiazePOXIDE (LIBRIUM) capsule 25 mg  25 mg Oral Daily Starkes-Perry, Juel Burrow, FNP       dicyclomine  (BENTYL) tablet 20 mg  20 mg Oral Q6H PRN Sindy Guadeloupe, NP       hydrOXYzine (ATARAX) tablet 25 mg  25 mg Oral Q6H PRN Sindy Guadeloupe, NP       loperamide (IMODIUM) capsule 2-4 mg  2-4 mg Oral PRN Sindy Guadeloupe, NP       magnesium hydroxide (MILK OF MAGNESIA) suspension 30 mL  30 mL Oral Daily PRN Sindy Guadeloupe, NP       methocarbamol (ROBAXIN) tablet 500 mg  500 mg Oral Q8H PRN Sindy Guadeloupe, NP       multivitamin with minerals tablet 1 tablet  1 tablet Oral Daily Starkes-Perry, Juel Burrow, FNP       naproxen (NAPROSYN) tablet 500 mg  500 mg Oral BID PRN Sindy Guadeloupe, NP       OLANZapine (ZYPREXA) injection 10 mg  10 mg Intramuscular TID PRN Sindy Guadeloupe, NP       OLANZapine (ZYPREXA) injection 5 mg  5 mg Intramuscular TID PRN Sindy Guadeloupe, NP       OLANZapine zydis (ZYPREXA) disintegrating tablet 5 mg  5 mg Oral TID PRN Sindy Guadeloupe, NP       ondansetron (ZOFRAN-ODT) disintegrating tablet 4 mg  4 mg Oral Q6H PRN Sindy Guadeloupe, NP       thiamine (VITAMIN B1) injection 100 mg  100 mg Intramuscular Once Maryagnes Amos, FNP       Current Outpatient Medications  Medication Sig Dispense Refill   naloxone (NARCAN) nasal spray 4 mg/0.1 mL Place 1 spray into the nose once as needed (for an opioid crisis).     OVER THE COUNTER MEDICATION Take 1,000 mg by mouth every 6 (six) hours as needed (For pain). Tylenol Extra Strength Rapid Release Gels (Acetaminophen 500mg )     ADVIL 200 MG CAPS Take 1-2 capsules (200-400 mg total) by mouth every 6 (six) hours as needed (for pain or headaches). (Patient not taking: Reported on 11/22/2023) 40 capsule 0   methocarbamol (ROBAXIN) 500 MG tablet Take 1 tablet (500 mg total) by mouth every 8 (eight) hours as needed. (Patient not taking: Reported on 11/22/2023) 8 tablet 0    Labs  Lab Results:  Admission on 11/21/2023  Component Date Value Ref Range Status   WBC 11/21/2023 5.6  4.0 - 10.5 K/uL Final   RBC 11/21/2023 4.14 (L)  4.22 - 5.81 MIL/uL Final    Hemoglobin 11/21/2023 13.1  13.0 - 17.0 g/dL Final   HCT 16/07/9603 38.8 (L)  39.0 - 52.0 % Final   MCV 11/21/2023 93.7  80.0 - 100.0 fL Final   MCH 11/21/2023 31.6  26.0 - 34.0 pg Final   MCHC 11/21/2023 33.8  30.0 - 36.0 g/dL Final   RDW 54/06/8118 13.2  11.5 - 15.5 % Final   Platelets 11/21/2023 298  150 - 400 K/uL Final   nRBC 11/21/2023 0.0  0.0 - 0.2 % Final   Neutrophils Relative % 11/21/2023 83  % Final   Neutro Abs 11/21/2023 4.6  1.7 - 7.7 K/uL Final   Lymphocytes Relative 11/21/2023 14  % Final  Lymphs Abs 11/21/2023 0.8  0.7 - 4.0 K/uL Final   Monocytes Relative 11/21/2023 3  % Final   Monocytes Absolute 11/21/2023 0.2  0.1 - 1.0 K/uL Final   Eosinophils Relative 11/21/2023 0  % Final   Eosinophils Absolute 11/21/2023 0.0  0.0 - 0.5 K/uL Final   Basophils Relative 11/21/2023 0  % Final   Basophils Absolute 11/21/2023 0.0  0.0 - 0.1 K/uL Final   Immature Granulocytes 11/21/2023 0  % Final   Abs Immature Granulocytes 11/21/2023 0.01  0.00 - 0.07 K/uL Final   Performed at [facility], [address], [place], [place] [address]   Sodium 11/21/2023 135  135 - 145 mmol/L Final   Potassium 11/21/2023 4.2  3.5 - 5.1 mmol/L Final   Chloride 11/21/2023 99  98 - 111 mmol/L Final   CO2 11/21/2023 26  22 - 32 mmol/L Final   Glucose, Bld 11/21/2023 162 (H)  70 - 99 mg/dL Final   Glucose reference range applies only to samples taken after fasting for at least 8 hours.   BUN 11/21/2023 7  6 - 20 mg/dL Final   Creatinine, Ser 11/21/2023 0.63  0.61 - 1.24 mg/dL Final   Calcium 60/45/4098 8.9  8.9 - 10.3 mg/dL Final   Total Protein 11/91/4782 8.1  6.5 - 8.1 g/dL Final   Albumin 95/62/1308 2.8 (L)  3.5 - 5.0 g/dL Final   AST 65/78/4696 91 (H)  15 - 41 U/L Final   ALT 11/21/2023 55 (H)  0 - 44 U/L Final   Alkaline Phosphatase 11/21/2023 87  38 - 126 U/L Final   Total Bilirubin 11/21/2023 0.8  0.0 - 1.2 mg/dL Final   GFR, Estimated 11/21/2023 >60  >60 mL/min Final   Comment:  (NOTE) Calculated using the CKD-EPI Creatinine Equation (2021)    Anion gap 11/21/2023 10  5 - 15 Final   Performed at [facility], [address] [place], [place] [address]   Alcohol, Ethyl (B) 11/21/2023 <10  <10 mg/dL Final   Comment: (NOTE) Lowest detectable limit for serum alcohol is 10 mg/dL.  For medical purposes only. Performed at [facility], [address] [place], [place] [address]    TSH 11/21/2023 0.365  0.350 - 4.500 uIU/mL Final   Comment: Performed by a 3rd Generation assay with a functional sensitivity of <=0.01 uIU/mL. Performed at [facility], [address] [place], [place] [address]    POC Amphetamine UR 11/22/2023 None Detected  NONE DETECTED (Cut Off Level 1000 ng/mL) Final   POC Secobarbital (BAR) 11/22/2023 None Detected  NONE DETECTED (Cut Off Level 300 ng/mL) Final   POC Buprenorphine (BUP) 11/22/2023 None Detected  NONE DETECTED (Cut Off Level 10 ng/mL) Final   POC Oxazepam (BZO) 11/22/2023 Positive (A)  NONE DETECTED (Cut Off Level 300 ng/mL) Final   POC Cocaine UR 11/22/2023 Positive (A)  NONE DETECTED (Cut Off Level 300 ng/mL) Final   POC Methamphetamine UR 11/22/2023 None Detected  NONE DETECTED (Cut Off Level 1000 ng/mL) Final   POC Morphine 11/22/2023 None Detected  NONE DETECTED (Cut Off Level 300 ng/mL) Final   POC Methadone UR 11/22/2023 None Detected  NONE DETECTED (Cut Off Level 300 ng/mL) Final   POC Oxycodone UR 11/22/2023 None Detected  NONE DETECTED (Cut Off Level 100 ng/mL) Final   POC Marijuana UR 11/22/2023 Positive (A)  NONE DETECTED (Cut Off Level 50 ng/mL) Final  Admission on 11/19/2023, Discharged on 11/20/2023  Component Date Value Ref Range Status   Sodium 11/19/2023 138  135 - 145 mmol/L Final   Potassium 11/19/2023 3.4 (L)  3.5 - 5.1 mmol/L Final   Chloride 11/19/2023 103  98 - 111 mmol/L Final   CO2 11/19/2023 26  22 - 32 mmol/L Final   Glucose, Bld 11/19/2023 123 (H)  70 - 99 mg/dL Final   Glucose  reference range applies only to samples taken after fasting for at least 8 hours.   BUN 11/19/2023 <5 (L)  6 - 20 mg/dL Final   Creatinine, Ser 11/19/2023 0.50 (L)  0.61 - 1.24 mg/dL Final   Calcium 95/62/1308 8.2 (L)  8.9 - 10.3 mg/dL Final   GFR, Estimated 11/19/2023 >60  >60 mL/min Final   Comment: (NOTE) Calculated using the CKD-EPI Creatinine Equation (2021)    Anion gap 11/19/2023 9  5 - 15 Final   Performed at [facility], [address], [place], [place] [address]   WBC 11/19/2023 8.4  4.0 - 10.5 K/uL Final   RBC 11/19/2023 4.00 (L)  4.22 - 5.81 MIL/uL Final   Hemoglobin 11/19/2023 12.6 (L)  13.0 - 17.0 g/dL Final   HCT 69/62/9528 37.7 (L)  39.0 - 52.0 % Final   MCV 11/19/2023 94.3  80.0 - 100.0 fL Final   MCH 11/19/2023 31.5  26.0 - 34.0 pg Final   MCHC 11/19/2023 33.4  30.0 - 36.0 g/dL Final   RDW 41/32/4401 13.2  11.5 - 15.5 % Final   Platelets 11/19/2023 267  150 - 400 K/uL Final   nRBC 11/19/2023 0.0  0.0 - 0.2 % Final   Neutrophils Relative % 11/19/2023 65  % Final   Neutro Abs 11/19/2023 5.5  1.7 - 7.7 K/uL Final   Lymphocytes Relative 11/19/2023 22  % Final   Lymphs Abs 11/19/2023 1.9  0.7 - 4.0 K/uL Final   Monocytes Relative 11/19/2023 11  % Final   Monocytes Absolute 11/19/2023 0.9  0.1 - 1.0 K/uL Final   Eosinophils Relative 11/19/2023 1  % Final   Eosinophils Absolute 11/19/2023 0.1  0.0 - 0.5 K/uL Final   Basophils Relative 11/19/2023 1  % Final   Basophils Absolute 11/19/2023 0.1  0.0 - 0.1 K/uL Final   Immature Granulocytes 11/19/2023 0  % Final   Abs Immature Granulocytes 11/19/2023 0.03  0.00 - 0.07 K/uL Final   Performed at [facility], [address] [place], [place] [address]   Sed Rate 11/19/2023 77 (H)  0 - 16 mm/hr Final   Performed at [facility], [address] [place], [place] [address]   CRP 11/19/2023 4.2 (H)  <1.0 mg/dL Final   Performed at [facility], [address],  [place], [place] [address]   Sodium 11/19/2023 141  135 - 145 mmol/L Final   Potassium 11/19/2023 3.5  3.5 - 5.1 mmol/L Final   Chloride 11/19/2023 101  98 - 111 mmol/L Final   BUN 11/19/2023 <3 (L)  6 - 20 mg/dL Final   Creatinine, Ser 11/19/2023 0.70  0.61 - 1.24 mg/dL Final   Glucose, Bld 25/95/6387 126 (H)  70 - 99 mg/dL Final   Glucose reference range applies only to samples taken after fasting for at least 8 hours.   Calcium, Ion 11/19/2023 1.05 (L)  1.15 - 1.40 mmol/L Final   TCO2 11/19/2023 27  22 - 32 mmol/L Final   Hemoglobin 11/19/2023 12.9 (L)  13.0 - 17.0 g/dL Final  HCT 11/19/2023 38.0 (L)  39.0 - 52.0 % Final   SARS Coronavirus 2 by RT PCR 11/19/2023 NEGATIVE  NEGATIVE Final   Comment: (NOTE) SARS-CoV-2 target nucleic acids are NOT DETECTED.  The SARS-CoV-2 RNA is generally detectable in upper respiratory specimens during the acute phase of infection. The lowest concentration of SARS-CoV-2 viral copies this assay can detect is 138 copies/mL. A negative result does not preclude SARS-Cov-2 infection and should not be used as the sole basis for treatment or other patient management decisions. A negative result may occur with  improper specimen collection/handling, submission of specimen other than nasopharyngeal swab, presence of viral mutation(s) within the areas targeted by this assay, and inadequate number of viral copies(<138 copies/mL). A negative result must be combined with clinical observations, patient history, and epidemiological information. The expected result is Negative.  Fact Sheet for Patients:  BloggerCourse.com  Fact Sheet for Healthcare Providers:  SeriousBroker.it  This test is no                          t yet approved or cleared by the [place] FDA and  has been authorized for detection and/or diagnosis of SARS-CoV-2 by FDA under an Emergency Use Authorization (EUA). This EUA will remain  in  effect (meaning this test can be used) for the duration of the COVID-19 declaration under Section 564(b)(1) of the Act, 21 U.S.C.section 360bbb-3(b)(1), unless the authorization is terminated  or revoked sooner.       Influenza A by PCR 11/19/2023 NEGATIVE  NEGATIVE Final   Influenza B by PCR 11/19/2023 NEGATIVE  NEGATIVE Final   Comment: (NOTE) The Xpert Xpress SARS-CoV-2/FLU/RSV plus assay is intended as an aid in the diagnosis of influenza from Nasopharyngeal swab specimens and should not be used as a sole basis for treatment. Nasal washings and aspirates are unacceptable for Xpert Xpress SARS-CoV-2/FLU/RSV testing.  Fact Sheet for Patients: BloggerCourse.com  Fact Sheet for Healthcare Providers: SeriousBroker.it  This test is not yet approved or cleared by the [place] FDA and has been authorized for detection and/or diagnosis of SARS-CoV-2 by FDA under an Emergency Use Authorization (EUA). This EUA will remain in effect (meaning this test can be used) for the duration of the COVID-19 declaration under Section 564(b)(1) of the Act, 21 U.S.C. section 360bbb-3(b)(1), unless the authorization is terminated or revoked.     Resp Syncytial Virus by PCR 11/19/2023 NEGATIVE  NEGATIVE Final   Comment: (NOTE) Fact Sheet for Patients: BloggerCourse.com  Fact Sheet for Healthcare Providers: SeriousBroker.it  This test is not yet approved or cleared by the [place] FDA and has been authorized for detection and/or diagnosis of SARS-CoV-2 by FDA under an Emergency Use Authorization (EUA). This EUA will remain in effect (meaning this test can be used) for the duration of the COVID-19 declaration under Section 564(b)(1) of the Act, 21 U.S.C. section 360bbb-3(b)(1), unless the authorization is terminated or revoked.  Performed at [facility], [address] [place], [place] [address]   Admission on 11/09/2023, Discharged on 11/09/2023  Component Date Value Ref Range Status   Color, Urine 11/09/2023 AMBER (A)  YELLOW Final   BIOCHEMICALS MAY BE AFFECTED BY COLOR   APPearance 11/09/2023 CLEAR  CLEAR Final   Specific Gravity, Urine 11/09/2023 1.036 (H)  1.005 - 1.030 Final   pH 11/09/2023 5.0  5.0 - 8.0 Final   Glucose, UA 11/09/2023 NEGATIVE  NEGATIVE mg/dL Final   Hgb urine  dipstick 11/09/2023 NEGATIVE  NEGATIVE Final   Bilirubin Urine 11/09/2023 SMALL (A)  NEGATIVE Final   Ketones, ur 11/09/2023 NEGATIVE  NEGATIVE mg/dL Final   Protein, ur 16/07/9603 30 (A)  NEGATIVE mg/dL Final   Nitrite 54/06/8118 NEGATIVE  NEGATIVE Final   Leukocytes,Ua 11/09/2023 NEGATIVE  NEGATIVE Final   RBC / HPF 11/09/2023 0-5  0 - 5 RBC/hpf Final   WBC, UA 11/09/2023 0-5  0 - 5 WBC/hpf Final   Bacteria, UA 11/09/2023 NONE SEEN  NONE SEEN Final   Squamous Epithelial / HPF 11/09/2023 0-5  0 - 5 /HPF Final   Mucus 11/09/2023 PRESENT   Final   Performed at [facility], [address], [place], [place] [address]   Opiates 11/09/2023 POSITIVE (A)  NONE DETECTED Final   Cocaine 11/09/2023 POSITIVE (A)  NONE DETECTED Final   Benzodiazepines 11/09/2023 POSITIVE (A)  NONE DETECTED Final   Amphetamines 11/09/2023 NONE DETECTED  NONE DETECTED Final   Tetrahydrocannabinol 11/09/2023 POSITIVE (A)  NONE DETECTED Final   Barbiturates 11/09/2023 NONE DETECTED  NONE DETECTED Final   Comment: (NOTE) DRUG SCREEN FOR MEDICAL PURPOSES ONLY.  IF CONFIRMATION IS NEEDED FOR ANY PURPOSE, NOTIFY LAB WITHIN 5 DAYS.  LOWEST DETECTABLE LIMITS FOR URINE DRUG SCREEN Drug Class                     Cutoff (ng/mL) Amphetamine and metabolites    1000 Barbiturate and metabolites    200 Benzodiazepine                 200 Opiates and metabolites        300 Cocaine and metabolites        300 THC                            50 Performed at [facility],  [address], [place], [place] [address]   Admission on 08/10/2023, Discharged on 08/14/2023  Component Date Value Ref Range Status   Sodium 08/10/2023 137  135 - 145 mmol/L Final   Potassium 08/10/2023 3.4 (L)  3.5 - 5.1 mmol/L Final   Chloride 08/10/2023 100  98 - 111 mmol/L Final   CO2 08/10/2023 25  22 - 32 mmol/L Final   Glucose, Bld 08/10/2023 109 (H)  70 - 99 mg/dL Final   Glucose reference range applies only to samples taken after fasting for at least 8 hours.   BUN 08/10/2023 11  6 - 20 mg/dL Final   Creatinine, Ser 08/10/2023 0.71  0.61 - 1.24 mg/dL Final   Calcium 30/86/5784 8.8 (L)  8.9 - 10.3 mg/dL Final   Total Protein 69/62/9528 7.9  6.5 - 8.1 g/dL Final   Albumin 41/32/4401 3.8  3.5 - 5.0 g/dL Final   AST 02/72/5366 466 (H)  15 - 41 U/L Final   ALT 08/10/2023 264 (H)  0 - 44 U/L Final   Alkaline Phosphatase 08/10/2023 112  38 - 126 U/L Final   Total Bilirubin 08/10/2023 1.2 (H)  <1.2 mg/dL Final   GFR, Estimated 08/10/2023 >60  >60 mL/min Final   Comment: (NOTE) Calculated using the CKD-EPI Creatinine Equation (2021)    Anion gap 08/10/2023 12  5 - 15 Final   Performed at [facility], [address] [place], [place] [address]   Alcohol, Ethyl (B) 08/10/2023 144 (H)  <10 mg/dL Final   Comment: (NOTE) Lowest detectable limit  for serum alcohol is 10 mg/dL.  For medical purposes only. Performed at [facility], [address], [place], [place] [address]    Salicylate Lvl 08/10/2023 <7.0 (L)  7.0 - 30.0 mg/dL Final   Performed at [facility], [address] [place], [place] [address]   Acetaminophen (Tylenol), Serum 08/10/2023 <10 (L)  10 - 30 ug/mL Final   Comment: (NOTE) Therapeutic concentrations vary significantly. A range of 10-30 ug/mL  may be an effective concentration for many patients. However, some  are best treated at concentrations outside of this range. Acetaminophen concentrations >150  ug/mL at 4 hours after ingestion  and >50 ug/mL at 12 hours after ingestion are often associated with  toxic reactions.  Performed at [facility], [address], [place], [place] [address]    WBC 08/10/2023 8.0  4.0 - 10.5 K/uL Final   RBC 08/10/2023 4.13 (L)  4.22 - 5.81 MIL/uL Final   Hemoglobin 08/10/2023 13.8  13.0 - 17.0 g/dL Final   HCT 91/47/8295 39.4  39.0 - 52.0 % Final   MCV 08/10/2023 95.4  80.0 - 100.0 fL Final   MCH 08/10/2023 33.4  26.0 - 34.0 pg Final   MCHC 08/10/2023 35.0  30.0 - 36.0 g/dL Final   RDW 62/13/0865 13.1  11.5 - 15.5 % Final   Platelets 08/10/2023 226  150 - 400 K/uL Final   nRBC 08/10/2023 0.0  0.0 - 0.2 % Final   Performed at [facility], [address], [place], [place] [address]   Opiates 08/10/2023 NONE DETECTED  NONE DETECTED Final   Cocaine 08/10/2023 POSITIVE (A)  NONE DETECTED Final   Benzodiazepines 08/10/2023 NONE DETECTED  NONE DETECTED Final   Amphetamines 08/10/2023 POSITIVE (A)  NONE DETECTED Final   Tetrahydrocannabinol 08/10/2023 POSITIVE (A)  NONE DETECTED Final   Barbiturates 08/10/2023 NONE DETECTED  NONE DETECTED Final   Comment: (NOTE) DRUG SCREEN FOR MEDICAL PURPOSES ONLY.  IF CONFIRMATION IS NEEDED FOR ANY PURPOSE, NOTIFY LAB WITHIN 5 DAYS.  LOWEST DETECTABLE LIMITS FOR URINE DRUG SCREEN Drug Class                     Cutoff (ng/mL) Amphetamine and metabolites    1000 Barbiturate and metabolites    200 Benzodiazepine                 200 Opiates and metabolites        300 Cocaine and metabolites        300 THC                            50 Performed at [facility], [address] [place], [place] [address]    Total CK 08/10/2023 461 (H)  49 - 397 U/L Final   Performed at [facility], [address], [place], [place] [address]   Hepatitis B Surface Ag 08/11/2023 NON REACTIVE  NON REACTIVE Final   HCV Ab 08/11/2023 Reactive (A)  NON REACTIVE  Final   Comment: (NOTE) The CDC recommends that a Reactive HCV antibody result be followed up  with a HCV Nucleic Acid Amplification test.     Hep A IgM 08/11/2023 NON REACTIVE  NON REACTIVE Final   Hep B C IgM 08/11/2023 NON REACTIVE  NON REACTIVE Final   Performed at [facility], [address], [place], [place] [address]  Total Protein 08/12/2023 7.4  6.5 - 8.1 g/dL Final   Albumin 60/45/4098 3.2 (L)  3.5 - 5.0 g/dL Final   AST 11/91/4782 381 (H)  15 - 41 U/L Final   ALT 08/12/2023 247 (H)  0 - 44 U/L Final   Alkaline Phosphatase 08/12/2023 97  38 - 126 U/L Final   Total Bilirubin 08/12/2023 1.0  <1.2 mg/dL Final   Bilirubin, Direct 08/12/2023 0.2  0.0 - 0.2 mg/dL Final   Indirect Bilirubin 08/12/2023 0.8  0.3 - 0.9 mg/dL Final   Performed at [facility], [address], [place], [place] [address]   Total CK 08/12/2023 41 (L)  49 - 397 U/L Final   Performed at [facility], [address], [place], [place] [address]   Ammonia 08/12/2023 48 (H)  9 - 35 umol/L Final   Performed at [facility], [address], [place], [place] [address]  Admission on 08/04/2023, Discharged on 08/07/2023  Component Date Value Ref Range Status   Amphetamines, Urine 08/04/2023 Negative  Cutoff=1000 ng/mL Final   Amphetamine test includes Amphetamine and Methamphetamine.   Barbiturate, Ur 08/04/2023 Negative  Cutoff=300 ng/mL Final   Benzodiazepine Quant, Ur 08/04/2023 Negative  Cutoff=300 ng/mL Final   Cannabinoid Quant, Ur 08/04/2023 Negative  Cutoff=50 ng/mL Final   Cocaine (Metab.) 08/04/2023 See Final Results  Cutoff=300 ng/mL Final   Opiate Quant, Ur 08/04/2023 Negative  Cutoff=300 ng/mL Final   Opiate test includes Codeine and Morphine only.   Phencyclidine, Ur 08/04/2023 Negative  Cutoff=25 ng/mL Final   Methadone Screen, Urine 08/04/2023 Negative  Cutoff=300 ng/mL Final   Propoxyphene, Urine 08/04/2023 Negative  Cutoff=300 ng/mL  Final   Ethanol U, Cloretta Ned 08/04/2023 See Final Results  Cutoff=0.020 % Final   Comment: (NOTE) Performed At: [facility] [address] [place], [place] [id number] Avis Epley PhD [id number]    WBC 08/04/2023 5.8  4.0 - 10.5 K/uL Final   RBC 08/04/2023 3.93 (L)  4.22 - 5.81 MIL/uL Final   Hemoglobin 08/04/2023 12.9 (L)  13.0 - 17.0 g/dL Final   HCT 64/40/3474 36.4 (L)  39.0 - 52.0 % Final   MCV 08/04/2023 92.6  80.0 - 100.0 fL Final   MCH 08/04/2023 32.8  26.0 - 34.0 pg Final   MCHC 08/04/2023 35.4  30.0 - 36.0 g/dL Final   RDW 25/95/6387 12.7  11.5 - 15.5 % Final   Platelets 08/04/2023 147 (L)  150 - 400 K/uL Final   Comment: SPECIMEN CHECKED FOR CLOTS REPEATED TO VERIFY PLATELET COUNT CONFIRMED BY SMEAR    nRBC 08/04/2023 0.0  0.0 - 0.2 % Final   Neutrophils Relative % 08/04/2023 76  % Final   Neutro Abs 08/04/2023 4.4  1.7 - 7.7 K/uL Final   Lymphocytes Relative 08/04/2023 16  % Final   Lymphs Abs 08/04/2023 0.9  0.7 - 4.0 K/uL Final   Monocytes Relative 08/04/2023 6  % Final   Monocytes Absolute 08/04/2023 0.3  0.1 - 1.0 K/uL Final   Eosinophils Relative 08/04/2023 0  % Final   Eosinophils Absolute 08/04/2023 0.0  0.0 - 0.5 K/uL Final   Basophils Relative 08/04/2023 1  % Final   Basophils Absolute 08/04/2023 0.0  0.0 - 0.1 K/uL Final   Immature Granulocytes 08/04/2023 1  % Final   Abs Immature Granulocytes 08/04/2023 0.04  0.00 - 0.07 K/uL Final   Performed at [facility], [address] [place], [place] [address]  Sodium 08/04/2023 134 (L)  135 - 145 mmol/L Final   Potassium 08/04/2023 3.0 (L)  3.5 - 5.1 mmol/L Final   Chloride 08/04/2023 95 (L)  98 - 111 mmol/L Final   CO2 08/04/2023 24  22 - 32 mmol/L Final   Glucose, Bld 08/04/2023 115 (H)  70 - 99 mg/dL Final   Glucose reference range applies only to samples taken after fasting for at least 8 hours.   BUN 08/04/2023 5 (L)  6 - 20 mg/dL Final   Creatinine, Ser 08/04/2023 0.56 (L)  0.61 - 1.24 mg/dL  Final   Calcium 16/07/9603 8.2 (L)  8.9 - 10.3 mg/dL Final   Total Protein 54/06/8118 7.7  6.5 - 8.1 g/dL Final   Albumin 14/78/2956 3.6  3.5 - 5.0 g/dL Final   AST 21/30/8657 205 (H)  15 - 41 U/L Final   ALT 08/04/2023 99 (H)  0 - 44 U/L Final   Alkaline Phosphatase 08/04/2023 98  38 - 126 U/L Final   Total Bilirubin 08/04/2023 1.0  0.3 - 1.2 mg/dL Final   GFR, Estimated 08/04/2023 >60  >60 mL/min Final   Comment: (NOTE) Calculated using the CKD-EPI Creatinine Equation (2021)    Anion gap 08/04/2023 15  5 - 15 Final   Performed at [facility], [address] [place], [place] [address]   Hgb A1c MFr Bld 08/04/2023 5.4  4.8 - 5.6 % Final   Comment: (NOTE) Pre diabetes:          5.7%-6.4%  Diabetes:              >6.4%  Glycemic control for   <7.0% adults with diabetes    Mean Plasma Glucose 08/04/2023 108.28  mg/dL Final   Performed at [facility], [address] [place], [place] [address]   TSH 08/04/2023 0.613  0.350 - 4.500 uIU/mL Final   Comment: Performed by a 3rd Generation assay with a functional sensitivity of <=0.01 uIU/mL. Performed at [facility], [address], [place], [place] [address]    Neisseria Gonorrhea 08/04/2023 Negative   Final   Chlamydia 08/04/2023 Negative   Final   Comment 08/04/2023 Normal Reference Ranger Chlamydia - Negative   Final   Comment 08/04/2023 Normal Reference Range Neisseria Gonorrhea - Negative   Final   RPR Ser Ql 08/04/2023 NON REACTIVE  NON REACTIVE Final   Performed at [facility], [address], [place], [place] [address]   HIV-1 P24 Antigen - HIV24 08/04/2023 NON REACTIVE  NON REACTIVE Final   Comment: (NOTE) Detection of p24 may be inhibited by biotin in the sample, causing false negative results in acute infection.    HIV 1/2 Antibodies 08/04/2023 NON REACTIVE  NON REACTIVE Final   Interpretation (HIV Ag Ab) 08/04/2023 A non reactive test result means that HIV 1 or HIV 2 antibodies and HIV 1 p24  antigen were not detected in the specimen.   Final   Performed at [facility], [address] [place], [place] [address]   Hepatitis B Surface Ag 08/04/2023 NON REACTIVE  NON REACTIVE Final   HCV Ab 08/04/2023 Reactive (A)  NON REACTIVE Final   Comment: (NOTE) The CDC recommends that a Reactive HCV antibody result be followed up  with a HCV Nucleic Acid Amplification test.     Hep A IgM 08/04/2023 NON REACTIVE  NON REACTIVE Final   Hep B C IgM 08/04/2023 NON REACTIVE  NON REACTIVE Final  Performed at [facility], [address] [place], [place] [address]   POC Amphetamine UR 08/04/2023 None Detected  NONE DETECTED (Cut Off Level 1000 ng/mL) Final   POC Secobarbital (BAR) 08/04/2023 None Detected  NONE DETECTED (Cut Off Level 300 ng/mL) Final   POC Buprenorphine (BUP) 08/04/2023 None Detected  NONE DETECTED (Cut Off Level 10 ng/mL) Final   POC Oxazepam (BZO) 08/04/2023 None Detected  NONE DETECTED (Cut Off Level 300 ng/mL) Final   POC Cocaine UR 08/04/2023 Positive (A)  NONE DETECTED (Cut Off Level 300 ng/mL) Final   POC Methamphetamine UR 08/04/2023 None Detected  NONE DETECTED (Cut Off Level 1000 ng/mL) Final   POC Morphine 08/04/2023 None Detected  NONE DETECTED (Cut Off Level 300 ng/mL) Final   POC Methadone UR 08/04/2023 None Detected  NONE DETECTED (Cut Off Level 300 ng/mL) Final   POC Oxycodone UR 08/04/2023 None Detected  NONE DETECTED (Cut Off Level 100 ng/mL) Final   POC Marijuana UR 08/04/2023 Positive (A)  NONE DETECTED (Cut Off Level 50 ng/mL) Final   Cocaine Metab Quant, Ur 08/04/2023 Positive (A)  Cutoff=300 Final   Benzoylecgonine GC/MS Conf 08/04/2023 35,080  Cutoff=150 ng/mL Final   Comment: (NOTE) Performed At: [facility] [address] [place], [place] [id number] Avis Epley PhD [id number]    Ethanol, Urine 08/04/2023 FID) (A)  Cutoff=0.020 Final   Comment: Positive Confirmation performed by Gas Chromatography (GC    Ethanol, Ur -  Confirmation 08/04/2023 0.099  Cutoff=0.020 % Final   Comment: (NOTE) GLUCOSE NEGATIVE Performed At: [facility] [address] [place], [place] [id number] Avis Epley PhD [id number]   Admission on 06/15/2023, Discharged on 06/16/2023  Component Date Value Ref Range Status   WBC 06/15/2023 5.8  4.0 - 10.5 K/uL Final   RBC 06/15/2023 4.61  4.22 - 5.81 MIL/uL Final   Hemoglobin 06/15/2023 15.5  13.0 - 17.0 g/dL Final   HCT 64/33/2951 46.0  39.0 - 52.0 % Final   MCV 06/15/2023 99.8  80.0 - 100.0 fL Final   MCH 06/15/2023 33.6  26.0 - 34.0 pg Final   MCHC 06/15/2023 33.7  30.0 - 36.0 g/dL Final   RDW 88/41/6606 13.3  11.5 - 15.5 % Final   Platelets 06/15/2023 244  150 - 400 K/uL Final   nRBC 06/15/2023 0.0  0.0 - 0.2 % Final   Neutrophils Relative % 06/15/2023 56  % Final   Neutro Abs 06/15/2023 3.3  1.7 - 7.7 K/uL Final   Lymphocytes Relative 06/15/2023 33  % Final   Lymphs Abs 06/15/2023 1.9  0.7 - 4.0 K/uL Final   Monocytes Relative 06/15/2023 6  % Final   Monocytes Absolute 06/15/2023 0.4  0.1 - 1.0 K/uL Final   Eosinophils Relative 06/15/2023 2  % Final   Eosinophils Absolute 06/15/2023 0.1  0.0 - 0.5 K/uL Final   Basophils Relative 06/15/2023 3  % Final   Basophils Absolute 06/15/2023 0.2 (H)  0.0 - 0.1 K/uL Final   Immature Granulocytes 06/15/2023 0  % Final   Abs Immature Granulocytes 06/15/2023 0.01  0.00 - 0.07 K/uL Final   Performed at [facility], [address] [place], [place] [address]   Sodium 06/15/2023 140  135 - 145 mmol/L Final   Potassium 06/15/2023 3.8  3.5 - 5.1 mmol/L Final   Chloride 06/15/2023 103  98 - 111 mmol/L Final   CO2 06/15/2023 28  22 - 32 mmol/L Final   Glucose,  Bld 06/15/2023 108 (H)  70 - 99 mg/dL Final   Glucose reference range applies only to samples taken after fasting for at least 8 hours.   BUN 06/15/2023 5 (L)  6 - 20 mg/dL Final   Creatinine, Ser 06/15/2023 0.65  0.61 - 1.24 mg/dL Final   Calcium 13/24/4010  8.3 (L)  8.9 - 10.3 mg/dL Final   Total Protein 27/25/3664 8.6 (H)  6.5 - 8.1 g/dL Final   Albumin 40/34/7425 3.6  3.5 - 5.0 g/dL Final   AST 95/63/8756 296 (H)  15 - 41 U/L Final   ALT 06/15/2023 205 (H)  0 - 44 U/L Final   Alkaline Phosphatase 06/15/2023 145 (H)  38 - 126 U/L Final   Total Bilirubin 06/15/2023 0.4  0.3 - 1.2 mg/dL Final   GFR, Estimated 06/15/2023 >60  >60 mL/min Final   Comment: (NOTE) Calculated using the CKD-EPI Creatinine Equation (2021)    Anion gap 06/15/2023 9  5 - 15 Final   Performed at [facility], [address] [place], [place] [address]   Lipase 06/15/2023 27  11 - 51 U/L Final   Performed at [facility], [address] [place], [place] [address]   Specimen Source 06/16/2023 URINE, CLEAN CATCH   Final   Color, Urine 06/16/2023 YELLOW  YELLOW Final   APPearance 06/16/2023 HAZY (A)  CLEAR Final   Specific Gravity, Urine 06/16/2023 1.015  1.005 - 1.030 Final   pH 06/16/2023 6.0  5.0 - 8.0 Final   Glucose, UA 06/16/2023 NEGATIVE  NEGATIVE mg/dL Final   Hgb urine dipstick 06/16/2023 NEGATIVE  NEGATIVE Final   Bilirubin Urine 06/16/2023 NEGATIVE  NEGATIVE Final   Ketones, ur 06/16/2023 NEGATIVE  NEGATIVE mg/dL Final   Protein, ur 16/60/6301 NEGATIVE  NEGATIVE mg/dL Final   Nitrite 60/07/9322 NEGATIVE  NEGATIVE Final   Leukocytes,Ua 06/16/2023 NEGATIVE  NEGATIVE Final   RBC / HPF 06/16/2023 0-5  0 - 5 RBC/hpf Final   WBC, UA 06/16/2023 0-5  0 - 5 WBC/hpf Final   Comment:        Reflex urine culture not performed if WBC <=10, OR if Squamous epithelial cells >5. If Squamous epithelial cells >5 suggest recollection.    Bacteria, UA 06/16/2023 NONE SEEN  NONE SEEN Final   Squamous Epithelial / HPF 06/16/2023 0-5  0 - 5 /HPF Final   Mucus 06/16/2023 PRESENT   Final   Performed at [facility], [address] [place], [place] [address]   Alcohol, Ethyl (B) 06/15/2023 288 (H)  <10 mg/dL Final    Comment: (NOTE) Lowest detectable limit for serum alcohol is 10 mg/dL.  For medical purposes only. Performed at [facility], [address], [place], [place] [address]    Salicylate Lvl 06/15/2023 <7.0 (L)  7.0 - 30.0 mg/dL Final   Performed at [facility], [address] [place], [place] [address]   Acetaminophen (Tylenol), Serum 06/15/2023 <10 (L)  10 - 30 ug/mL Final   Comment: (NOTE) Therapeutic concentrations vary significantly. A range of 10-30 ug/mL  may be an effective concentration for many patients. However, some  are best treated at concentrations outside of this range. Acetaminophen concentrations >150 ug/mL at 4 hours after ingestion  and >50 ug/mL at 12 hours after ingestion are often associated with  toxic reactions.  Performed at [facility], [address] [place], [place] [address]    Opiates 06/16/2023 NONE DETECTED  NONE DETECTED Final   Cocaine 06/16/2023 POSITIVE (A)  NONE DETECTED Final   Benzodiazepines 06/16/2023 NONE DETECTED  NONE DETECTED Final   Amphetamines 06/16/2023 NONE DETECTED  NONE DETECTED Final   Tetrahydrocannabinol 06/16/2023 POSITIVE (A)  NONE DETECTED Final   Barbiturates 06/16/2023 NONE DETECTED  NONE DETECTED Final   Comment: (NOTE) DRUG SCREEN FOR MEDICAL PURPOSES ONLY.  IF CONFIRMATION IS NEEDED FOR ANY PURPOSE, NOTIFY LAB WITHIN 5 DAYS.  LOWEST DETECTABLE LIMITS FOR URINE DRUG SCREEN Drug Class                     Cutoff (ng/mL) Amphetamine and metabolites    1000 Barbiturate and metabolites    200 Benzodiazepine                 200 Opiates and metabolites        300 Cocaine and metabolites        300 THC                            50 Performed at [facility], [address], [place], [place] [address]   Admission on 06/09/2023, Discharged on 06/10/2023  Component Date Value Ref Range Status   Lipase 06/09/2023 38  11 - 51 U/L Final    Performed at [facility], [address] [place], [place] [address]   Sodium 06/09/2023 137  135 - 145 mmol/L Final   Potassium 06/09/2023 3.6  3.5 - 5.1 mmol/L Final   Chloride 06/09/2023 100  98 - 111 mmol/L Final   CO2 06/09/2023 25  22 - 32 mmol/L Final   Glucose, Bld 06/09/2023 140 (H)  70 - 99 mg/dL Final   Glucose reference range applies only to samples taken after fasting for at least 8 hours.   BUN 06/09/2023 14  6 - 20 mg/dL Final   Creatinine, Ser 06/09/2023 0.75  0.61 - 1.24 mg/dL Final   Calcium 09/81/1914 8.6 (L)  8.9 - 10.3 mg/dL Final   Total Protein 78/29/5621 8.4 (H)  6.5 - 8.1 g/dL Final   Albumin 30/86/5784 3.6  3.5 - 5.0 g/dL Final   AST 69/62/9528 222 (H)  15 - 41 U/L Final   ALT 06/09/2023 176 (H)  0 - 44 U/L Final   Alkaline Phosphatase 06/09/2023 183 (H)  38 - 126 U/L Final   Total Bilirubin 06/09/2023 0.7  0.3 - 1.2 mg/dL Final   GFR, Estimated 06/09/2023 >60  >60 mL/min Final   Comment: (NOTE) Calculated using the CKD-EPI Creatinine Equation (2021)    Anion gap 06/09/2023 12  5 - 15 Final   Performed at [facility], [address] [place], [place] [address]   WBC 06/09/2023 10.3  4.0 - 10.5 K/uL Final   RBC 06/09/2023 4.30  4.22 - 5.81 MIL/uL Final   Hemoglobin 06/09/2023 14.7  13.0 - 17.0 g/dL Final   HCT 40/07/2724 42.1  39.0 - 52.0 % Final   MCV 06/09/2023 97.9  80.0 - 100.0 fL Final   MCH 06/09/2023 34.2 (H)  26.0 - 34.0 pg Final   MCHC 06/09/2023 34.9  30.0 - 36.0 g/dL Final   RDW 36/64/4034 12.8  11.5 - 15.5 % Final   Platelets 06/09/2023 315  150 - 400 K/uL Final   nRBC 06/09/2023 0.0  0.0 - 0.2 % Final   Performed at [facility], [address] [place],  [place] [address]   Color, Urine 06/10/2023 YELLOW  YELLOW Final   APPearance 06/10/2023 CLEAR  CLEAR Final   Specific Gravity, Urine 06/10/2023 1.010  1.005 - 1.030 Final   pH 06/10/2023 5.0  5.0 - 8.0 Final   Glucose, UA 06/10/2023  NEGATIVE  NEGATIVE mg/dL Final   Hgb urine dipstick 06/10/2023 NEGATIVE  NEGATIVE Final   Bilirubin Urine 06/10/2023 NEGATIVE  NEGATIVE Final   Ketones, ur 06/10/2023 NEGATIVE  NEGATIVE mg/dL Final   Protein, ur 84/69/6295 NEGATIVE  NEGATIVE mg/dL Final   Nitrite 28/41/3244 NEGATIVE  NEGATIVE Final   Leukocytes,Ua 06/10/2023 NEGATIVE  NEGATIVE Final   Performed at [facility], [address] [place], [place] [address]  Admission on 06/09/2023, Discharged on 06/09/2023  Component Date Value Ref Range Status   Opiates 06/09/2023 NONE DETECTED  NONE DETECTED Final   Cocaine 06/09/2023 POSITIVE (A)  NONE DETECTED Final   Benzodiazepines 06/09/2023 NONE DETECTED  NONE DETECTED Final   Amphetamines 06/09/2023 NONE DETECTED  NONE DETECTED Final   Tetrahydrocannabinol 06/09/2023 POSITIVE (A)  NONE DETECTED Final   Barbiturates 06/09/2023 NONE DETECTED  NONE DETECTED Final   Comment: (NOTE) DRUG SCREEN FOR MEDICAL PURPOSES ONLY.  IF CONFIRMATION IS NEEDED FOR ANY PURPOSE, NOTIFY LAB WITHIN 5 DAYS.  LOWEST DETECTABLE LIMITS FOR URINE DRUG SCREEN Drug Class                     Cutoff (ng/mL) Amphetamine and metabolites    1000 Barbiturate and metabolites    200 Benzodiazepine                 200 Opiates and metabolites        300 Cocaine and metabolites        300 THC                            50 Performed at [facility], [address], [place], [place] [address]    Color, Urine 06/09/2023 YELLOW  YELLOW Final   APPearance 06/09/2023 CLEAR  CLEAR Final   Specific Gravity, Urine 06/09/2023 1.016  1.005 - 1.030 Final   pH 06/09/2023 8.0  5.0 - 8.0 Final   Glucose, UA 06/09/2023 NEGATIVE  NEGATIVE mg/dL Final   Hgb urine dipstick 06/09/2023 NEGATIVE  NEGATIVE Final   Bilirubin Urine 06/09/2023 NEGATIVE  NEGATIVE Final   Ketones, ur 06/09/2023 NEGATIVE  NEGATIVE mg/dL Final   Protein, ur 44/12/4740 NEGATIVE  NEGATIVE mg/dL Final   Nitrite  59/56/3875 NEGATIVE  NEGATIVE Final   Leukocytes,Ua 06/09/2023 NEGATIVE  NEGATIVE Final   Performed at [facility], [address], [place], [place] [address]  Admission on 06/03/2023, Discharged on 06/05/2023  Component Date Value Ref Range Status   WBC 06/03/2023 6.4  4.0 - 10.5 K/uL Final   RBC 06/03/2023 3.88 (L)  4.22 - 5.81 MIL/uL Final   Hemoglobin 06/03/2023 13.3  13.0 - 17.0 g/dL Final   HCT 95/18/8416 38.0 (L)  39.0 - 52.0 % Final   MCV 06/03/2023 97.9  80.0 - 100.0 fL Final   MCH 06/03/2023 34.3 (H)  26.0 - 34.0 pg Final   MCHC 06/03/2023 35.0  30.0 - 36.0 g/dL Final   RDW 60/63/0160 13.0  11.5 - 15.5 % Final   Platelets 06/03/2023 233  150 - 400 K/uL Final   nRBC 06/03/2023 0.0  0.0 - 0.2 % Final   Neutrophils Relative %  06/03/2023 49  % Final   Neutro Abs 06/03/2023 3.2  1.7 - 7.7 K/uL Final   Lymphocytes Relative 06/03/2023 40  % Final   Lymphs Abs 06/03/2023 2.6  0.7 - 4.0 K/uL Final   Monocytes Relative 06/03/2023 7  % Final   Monocytes Absolute 06/03/2023 0.4  0.1 - 1.0 K/uL Final   Eosinophils Relative 06/03/2023 2  % Final   Eosinophils Absolute 06/03/2023 0.1  0.0 - 0.5 K/uL Final   Basophils Relative 06/03/2023 2  % Final   Basophils Absolute 06/03/2023 0.1  0.0 - 0.1 K/uL Final   Immature Granulocytes 06/03/2023 0  % Final   Abs Immature Granulocytes 06/03/2023 0.02  0.00 - 0.07 K/uL Final   Performed at [facility], [address] [place], [place] [address]   Sodium 06/03/2023 140  135 - 145 mmol/L Final   Potassium 06/03/2023 3.4 (L)  3.5 - 5.1 mmol/L Final   Chloride 06/03/2023 104  98 - 111 mmol/L Final   CO2 06/03/2023 26  22 - 32 mmol/L Final   Glucose, Bld 06/03/2023 112 (H)  70 - 99 mg/dL Final   Glucose reference range applies only to samples taken after fasting for at least 8 hours.   BUN 06/03/2023 5 (L)  6 - 20 mg/dL Final   Creatinine, Ser 06/03/2023 1.04  0.61 - 1.24 mg/dL Final   Calcium 32/95/1884 8.3 (L)  8.9 -  10.3 mg/dL Final   Total Protein 16/60/6301 7.0  6.5 - 8.1 g/dL Final   Albumin 60/07/9322 3.2 (L)  3.5 - 5.0 g/dL Final   AST 55/73/2202 258 (H)  15 - 41 U/L Final   ALT 06/03/2023 186 (H)  0 - 44 U/L Final   Alkaline Phosphatase 06/03/2023 107  38 - 126 U/L Final   Total Bilirubin 06/03/2023 0.8  0.3 - 1.2 mg/dL Final   GFR, Estimated 06/03/2023 >60  >60 mL/min Final   Comment: (NOTE) Calculated using the CKD-EPI Creatinine Equation (2021)    Anion gap 06/03/2023 10  5 - 15 Final   Performed at [facility], [address], [place], [place] [address]   Alcohol, Ethyl (B) 06/03/2023 285 (H)  <10 mg/dL Final   Comment: (NOTE) Lowest detectable limit for serum alcohol is 10 mg/dL.  For medical purposes only. Performed at [facility], [address] [place], [place] [address]    POC Amphetamine UR 06/03/2023 None Detected  NONE DETECTED (Cut Off Level 1000 ng/mL) Final   POC Secobarbital (BAR) 06/03/2023 None Detected  NONE DETECTED (Cut Off Level 300 ng/mL) Final   POC Buprenorphine (BUP) 06/03/2023 None Detected  NONE DETECTED (Cut Off Level 10 ng/mL) Final   POC Oxazepam (BZO) 06/03/2023 None Detected  NONE DETECTED (Cut Off Level 300 ng/mL) Final   POC Cocaine UR 06/03/2023 Positive (A)  NONE DETECTED (Cut Off Level 300 ng/mL) Final   POC Methamphetamine UR 06/03/2023 None Detected  NONE DETECTED (Cut Off Level 1000 ng/mL) Final   POC Morphine 06/03/2023 Positive (A)  NONE DETECTED (Cut Off Level 300 ng/mL) Final   POC Methadone UR 06/03/2023 None Detected  NONE DETECTED (Cut Off Level 300 ng/mL) Final   POC Oxycodone UR 06/03/2023 None Detected  NONE DETECTED (Cut Off Level 100 ng/mL) Final   POC Marijuana UR 06/03/2023 Positive (A)  NONE DETECTED (Cut Off Level 50 ng/mL) Final  Admission on 05/28/2023, Discharged on 05/28/2023  Component Date Value Ref Range Status   WBC  05/28/2023 11.2 (H)  4.0 - 10.5 K/uL Final   RBC 05/28/2023 4.18 (L)  4.22 - 5.81 MIL/uL Final    Hemoglobin 05/28/2023 14.3  13.0 - 17.0 g/dL Final   HCT 72/53/6644 41.5  39.0 - 52.0 % Final   MCV 05/28/2023 99.3  80.0 - 100.0 fL Final   MCH 05/28/2023 34.2 (H)  26.0 - 34.0 pg Final   MCHC 05/28/2023 34.5  30.0 - 36.0 g/dL Final   RDW 03/47/4259 13.2  11.5 - 15.5 % Final   Platelets 05/28/2023 244  150 - 400 K/uL Final   nRBC 05/28/2023 0.0  0.0 - 0.2 % Final   Neutrophils Relative % 05/28/2023 65  % Final   Neutro Abs 05/28/2023 7.3  1.7 - 7.7 K/uL Final   Lymphocytes Relative 05/28/2023 27  % Final   Lymphs Abs 05/28/2023 3.0  0.7 - 4.0 K/uL Final   Monocytes Relative 05/28/2023 6  % Final   Monocytes Absolute 05/28/2023 0.7  0.1 - 1.0 K/uL Final   Eosinophils Relative 05/28/2023 1  % Final   Eosinophils Absolute 05/28/2023 0.1  0.0 - 0.5 K/uL Final   Basophils Relative 05/28/2023 1  % Final   Basophils Absolute 05/28/2023 0.1  0.0 - 0.1 K/uL Final   Immature Granulocytes 05/28/2023 0  % Final   Abs Immature Granulocytes 05/28/2023 0.04  0.00 - 0.07 K/uL Final   Performed at [facility], [address] [place], [place] [address]   Sodium 05/28/2023 139  135 - 145 mmol/L Final   Potassium 05/28/2023 3.1 (L)  3.5 - 5.1 mmol/L Final   Chloride 05/28/2023 106  98 - 111 mmol/L Final   CO2 05/28/2023 22  22 - 32 mmol/L Final   Glucose, Bld 05/28/2023 100 (H)  70 - 99 mg/dL Final   Glucose reference range applies only to samples taken after fasting for at least 8 hours.   BUN 05/28/2023 6  6 - 20 mg/dL Final   Creatinine, Ser 05/28/2023 0.70  0.61 - 1.24 mg/dL Final   Calcium 56/43/3295 8.0 (L)  8.9 - 10.3 mg/dL Final   GFR, Estimated 05/28/2023 >60  >60 mL/min Final   Comment: (NOTE) Calculated using the CKD-EPI Creatinine Equation (2021)    Anion gap 05/28/2023 11  5 - 15 Final   Performed at [facility], [address] [place], [place] [address]   Alcohol, Ethyl (B) 05/28/2023 279 (H)  <10 mg/dL Final   Comment: (NOTE) Lowest detectable limit for serum  alcohol is 10 mg/dL.  For medical purposes only. Performed at [facility], [address], [place], [place] [address]    Opiates 05/28/2023 POSITIVE (A)  NONE DETECTED Final   Cocaine 05/28/2023 POSITIVE (A)  NONE DETECTED Final   Benzodiazepines 05/28/2023 NONE DETECTED  NONE DETECTED Final   Amphetamines 05/28/2023 POSITIVE (A)  NONE DETECTED Final   Tetrahydrocannabinol 05/28/2023 POSITIVE (A)  NONE DETECTED Final   Barbiturates 05/28/2023 NONE DETECTED  NONE DETECTED Final   Comment: (NOTE) DRUG SCREEN FOR MEDICAL PURPOSES ONLY.  IF CONFIRMATION IS NEEDED FOR ANY PURPOSE, NOTIFY LAB WITHIN 5 DAYS.  LOWEST DETECTABLE LIMITS FOR URINE DRUG SCREEN Drug Class                     Cutoff (ng/mL) Amphetamine and metabolites    1000 Barbiturate and metabolites    200 Benzodiazepine  200 Opiates and metabolites        300 Cocaine and metabolites        300 THC                            50 Performed at [facility], [address] [place], [place] [address]   There may be more visits with results that are not included.    Blood Alcohol level:  Lab Results  Component Value Date   ETH <10 11/21/2023   ETH 144 (H) 08/10/2023    Metabolic Disorder Labs: Lab Results  Component Value Date   HGBA1C 5.4 08/04/2023   MPG 108.28 08/04/2023   MPG 111 02/24/2023   Lab Results  Component Value Date   PROLACTIN 31.4 (H) 11/16/2019   Lab Results  Component Value Date   CHOL 147 02/24/2023   TRIG 96 02/24/2023   HDL 42 02/24/2023   CHOLHDL 3.5 02/24/2023   VLDL 19 02/24/2023   LDLCALC 86 02/24/2023   LDLCALC 57 01/14/2023    Therapeutic Lab Levels: No results found for: "LITHIUM" No results found for: "VALPROATE" No results found for: "CBMZ"  Physical Findings   AIMS    Flowsheet Row [facility]) [facility]) [facility]  AIMS Total Score 0 0 0      AUDIT    Flowsheet Row [facility]) [facility] Ad[facility] (Discharged[facility] Ad[facility] (Discharged) from [facility] ([facility]charged[facility]  Alcohol Use Disorder Identification Test Final Score (AUDIT) 26 26 13  37      PHQ2-9    Flowsheet Row ED from 11/21/2023 in [name] ED from 06/03/2023 in [facility]  PHQ-2 Total Score 6 0  PHQ-9 Total Score 22 --      Flowsheet Row ED from 11/21/2023 in [name] Most recent reading at 11/22/2023 12:50 AM ED from 11/21/2023 in [facility] Most recent reading at 11/21/2023  7:45 PM ED from 11/21/2023 in [facility] Emergency Department at [facility] Most recent reading at 11/21/2023 11:06 AM  C-SSRS RISK CATEGORY No Risk High Risk No Risk        Musculoskeletal  Strength & Muscle Tone: within normal limits Gait & Station: normal Patient leans: Right  Psychiatric Specialty Exam  Presentation  General Appearance:  Appropriate for Environment; Fairly Groomed  Eye Contact: Fair  Speech: Clear and Coherent; Normal Rate  Speech Volume: Normal  Handedness: Right   Mood and Affect  Mood: depression, anxiety  Affect: Congruent   Thought Process  Thought Processes: Coherent; Linear  Descriptions of Associations:Intact  Orientation:Full (Time, Place and Person)  Thought Content:Logical  Diagnosis of Schizophrenia or Schizoaffective disorder in past: No  Duration of Psychotic Symptoms: Less than six months    Hallucinations:Hallucinations: None  Ideas of Reference:None  Suicidal Thoughts:Suicidal Thoughts: No  Homicidal Thoughts:Homicidal Thoughts: No   Sensorium  Memory: Immediate Fair; Recent Fair; Remote Fair  Judgment: Fair  Insight: Fair   Chartered certified accountant: Fair  Attention Span: Fair  Recall: [employer]  of Knowledge: Fair  Language: Fair   Lexicographer Activity: Psychomotor Activity: Normal   Assets  Assets: Communication Skills; Desire for Improvement; Physical Health; Resilience   Sleep  Sleep: Sleep: Fair Number of Hours of Sleep: 6   Nutritional Assessment (For [facility] and [facility] admissions only) Has the patient had a weight loss or gain of 10 pounds or more in the last 3 months?: No Has the patient had a decrease in food intake/or appetite?: No Does the patient have dental problems?: No Does the patient have eating habits or behaviors that may be indicators of an eating disorder including binging or inducing vomiting?: No Has the patient recently lost weight without trying?: 0 Has the patient been eating poorly because of a decreased appetite?: 0 Malnutrition Screening Tool Score: 0    Physical Exam  Physical Exam Vitals and nursing note reviewed.  Constitutional:      General: He is not in acute distress. HENT:     Nose: No congestion.  Cardiovascular:     Rate and Rhythm: Normal rate.  Pulmonary:     Effort: No respiratory distress.  Neurological:     Mental Status: He is alert.  Psychiatric:        Attention and Perception: He does not perceive auditory or visual hallucinations.        Mood and Affect: Mood is anxious and depressed.        Behavior: Behavior is not agitated or aggressive.        Thought Content: Thought content is not paranoid or delusional. Thought content does not include homicidal or suicidal ideation.    Review of Systems  Constitutional:  Negative for chills and fever.   Cardiovascular:  Negative for chest pain and palpitations.  Gastrointestinal:  Positive for abdominal pain. Negative for nausea and vomiting.  Neurological:  Positive for tremors. Negative for dizziness and headaches.  Psychiatric/Behavioral:  Positive for depression and substance abuse. Negative for hallucinations and suicidal ideas. The patient is nervous/anxious.    Blood pressure 110/79, pulse 69, temperature 98.3 F (36.8 C), temperature source Oral, resp. rate 18, SpO2 100%. There is no height or weight on file to calculate BMI.  Treatment Plan Summary: Daily contact with patient to assess and evaluate symptoms and progress in treatment  #Alcohol Use Disorder -initiate CIWA protocol with chlordiazepoxide taper -Thiamine injection 100 mg once then multivitamin po daily  #History of Opoid Use - COWS protocol  PRNs -Acetaminophen 650 mg as needed q6hrs for mild pain -Maalox/Mylanta 30 ml as needed q4hrs for indigestion -dicyclomine 20 mg as needed q6hrs for abdominal cramping -hydroxyzine 25 mg as needed for anxiety -loperamide 2-4 mg as needed diarrhea -Magnesium Hydroxide suspension 30 ml as needed for constipation -Naproxen tab 500 mg BID as needed aching/pain -Methocarbamol tab 500mg  as need for muscle spasms  -Ondansetron disintegrating tab 4 mg q6hrs as needed for nausea  Disposition: Patient meet criteria for inpatient rehab. LCSW to assist with placement.    Dyanne Carrel, RN 11/22/2023 9:44 AM

## 2023-11-22 NOTE — ED Notes (Signed)
 Patient is sleeping. Respirations equal and unlabored, skin warm and dry. No change in assessment or acuity. Routine safety checks conducted according to facility protocol. Will continue to monitor for safety.

## 2023-11-22 NOTE — Group Note (Signed)
 Group Topic: Communication  Group Date: 11/22/2023 Start Time: 2000 End Time: 2030 Facilitators: Rae Lips B  Department: [facility]  Number of Participants: 6  Group Focus: abuse issues, activities of daily living skills, check in, clarity of thought, communication, community group, and coping skills Treatment Modality:  Leisure Development and Spiritual Interventions utilized were group exercise, story telling, and support Purpose: enhance coping skills, express feelings, increase insight, regain self-worth, and reinforce self-care  Name: Mark Hammond Date of Birth: [birth year]  MR: [id number]    Level of Participation: PT DID NOT ATTEND GROUP Quality of Participation: cooperative Interactions with others: gave feedback Mood/Affect: appropriate Triggers (if applicable): NA Cognition: coherent/clear Progress: None Response: NA Plan: patient will be encouraged to attend groups.   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

## 2023-11-22 NOTE — Care Management (Signed)
  [facility] Care Management    Writer referred patient to [facility], [place] and [place]

## 2023-11-22 NOTE — ED Notes (Signed)
 Patient resting quietly in bed with eyes closed. Respirations equal and unlabored, skin warm and dry, NAD. Routine safety checks conducted according to facility protocol. Will continue to monitor for safety.

## 2023-11-23 DIAGNOSIS — F250 Schizoaffective disorder, bipolar type: Secondary | ICD-10-CM | POA: Diagnosis not present

## 2023-11-23 DIAGNOSIS — Z91148 Patient's other noncompliance with medication regimen for other reason: Secondary | ICD-10-CM | POA: Diagnosis not present

## 2023-11-23 DIAGNOSIS — F1910 Other psychoactive substance abuse, uncomplicated: Secondary | ICD-10-CM | POA: Diagnosis not present

## 2023-11-23 DIAGNOSIS — F1010 Alcohol abuse, uncomplicated: Secondary | ICD-10-CM | POA: Diagnosis not present

## 2023-11-23 MED ORDER — TRAZODONE HCL 50 MG PO TABS
50.0000 mg | ORAL_TABLET | Freq: Every evening | ORAL | Status: DC | PRN
  Administered 2023-11-23 – 2023-11-26 (×4): 50 mg via ORAL
  Filled 2023-11-23: qty 14
  Filled 2023-11-23 (×4): qty 1
  Filled 2023-11-23: qty 14

## 2023-11-23 NOTE — Group Note (Signed)
 Group Topic: Relapse and Recovery  Group Date: 11/23/2023 Start Time: 1245 End Time: 1330 Facilitators: Jenean Lindau, RN  Department: [facility]  Number of Participants: 10  Group Focus: acceptance, affirmation, chemical dependency education, chemical dependency issues, clarity of thought, and coping skills Treatment Modality:  Behavior Modification Therapy Interventions utilized were exploration, patient education, problem solving, and reality testing Purpose: enhance coping skills, explore maladaptive thinking, express feelings, express irrational fears, improve communication skills, increase insight, regain self-worth, reinforce self-care, and relapse prevention strategies  Name: Mark Hammond Date of Birth: March 19, 1991  MR: [id number]    Level of Participation:  Did not attend Quality of Participation:   Interactions with others:   Mood/Affect:   Triggers (if applicable):   Cognition:   Progress: None Response:   Plan: follow-up needed  Patients Problems:  Patient Active Problem List   Diagnosis Date Noted   Polysubstance abuse (HCC) 11/21/2023   Alcohol use disorder 08/04/2023   Stimulant use disorder 08/04/2023   Opioid use disorder 08/04/2023   Tobacco use disorder 08/04/2023   Cannabis use disorder 08/04/2023   Hepatitis C 03/01/2023   Anxiety state 01/14/2023   Insomnia 01/14/2023   Schizoaffective disorder (HCC) 01/13/2023   Schizophrenia (HCC) 11/15/2019   Polysubstance (including opioids) dependence, daily use (HCC)    Substance induced mood disorder (HCC) 10/24/2019   Methamphetamine use disorder, 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

## 2023-11-23 NOTE — ED Notes (Signed)
 Patient was having trouble ambulating and provider ordered a walker for him which he was given.  It is for his use while in the facility.

## 2023-11-23 NOTE — ED Notes (Addendum)
 Pt was found in his room crying, saying his leg hurt so pain, he got his left hip to knee nerve damaged. Pt denies injecting any substance in the leg but believes the nerves are damaged as a result of he being homeless and coiling himself to sleep on the same position for months. Medication has been administered by writer to help with pt pain. Writer asked pt if he want to the go to the ER, pt says no, and that whenever he goes to the ER with the leg damage pain, he is told there is nothing wrong with the pain. Pt is advised to notify staff when he need support from [place].

## 2023-11-23 NOTE — ED Notes (Signed)
 Patient is sleeping. Respirations equal and unlabored, skin warm and dry. No change in assessment or acuity. Routine safety checks conducted according to facility protocol. Will continue to monitor for safety.

## 2023-11-23 NOTE — Care Management (Signed)
 [facility] Care Management   Per Marcelino Duster, patient was accepted to [facility] on Monday 11-26-2022 at 9am.   [address] [place], [place]  [phone]

## 2023-11-23 NOTE — Group Note (Signed)
 Group Topic: Overcoming Obstacles  Group Date: 11/23/2023 Start Time: 1217 End Time: 1245 Facilitators: Vonzell Schlatter B  Department: [facility]  Number of Participants: 7  Group Focus: communication and daily focus Treatment Modality:  Psychoeducation Interventions utilized were patient education and problem solving Purpose: increase insight and reinforce self-care  Name: Mark Hammond Date of Birth: 10-Mar-1991  MR: [id number]    Level of Participation: Did not attend group Quality of Participation:  Interactions with others:  Mood/Affect:  Triggers (if applicable):  Cognition:  Progress:  Response: complained of being in pain Plan: follow-up needed  Patients Problems:  Patient Active Problem List   Diagnosis Date Noted   Polysubstance abuse (HCC) 11/21/2023   Alcohol use disorder 08/04/2023   Stimulant use disorder 08/04/2023   Opioid use disorder 08/04/2023   Tobacco use disorder 08/04/2023   Cannabis use disorder 08/04/2023   Hepatitis C 03/01/2023   Anxiety state 01/14/2023   Insomnia 01/14/2023   Schizoaffective disorder (HCC) 01/13/2023   Schizophrenia (HCC) 11/15/2019   Polysubstance (including opioids) dependence, daily use (HCC)    Substance induced mood disorder (HCC) 10/24/2019   Methamphetamine use disorder, 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

## 2023-11-23 NOTE — ED Provider Notes (Signed)
 Behavioral Health Progress Note  Date and Time: 11/23/2023 6:47 PM Name: Mark Hammond MRN:  [id number]  Subjective: "I am a fu**ing tired and in pain."   Mark Hammond, 33 y.o., male patient seen face to face by this provider and chart reviewed by Dr. Woodroe Mode on 11/22/2023.   HPI: Mark Hammond, 33 y/o male, with a history of MDD, schizoaffective disorder, polysubstance abuse, alcohol intoxication, intentional overdose.  Presented to [facility] voluntarily.  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  According to the patient he consume 3, 40 ounce beer and other liquor.  Patient reports he also used 2-1/2 g of cocaine has been using daily.  Patient stated he is unemployed, lives with his girlfriend.  Patient stated he was last hospitalized at [facility] for alcohol abuse and cocaine.  I review of patient records show multiple visits to different locations for the same issues.   Assessment: On evaluation today, the patient was observed lying in bed. He appears to be in no acute distress but very irritable. Patient was seen today for re-evaluation. Nursing reports patient continues to be irritable but has improved from yesterday. He is currently compliant with medication. The patient reports no side effects from medications. Current symptoms being addressed include: mood instability. On exam examination, the patient was sleeping and difficult to arouse. Patient eventually woke up but limited answers, and did not appear to be forthcoming with answers. Patient is eating and sleeping well. He reports that his leg is in pain, and he reports that the medication did provider mild relief. Patient denies SI/HI/AVH. The patient is currently on agitation protocol for aggression/psychosis/mania.    Diagnosis:  Final diagnoses:  Alcohol abuse  Polysubstance abuse (HCC)  Schizoaffective disorder, bipolar type (HCC)  H/O medication noncompliance    Total Time spent with  patient: 30 minutes  Past Psychiatric History: Pt reports a history of MDD, Bipolar Disorder & schizophrenia Past Medical History: Denies significant medical history. Family Psychiatric  History: None reported Social History: Patient resides with his girlfriend and both are currently unemployed.   Sleep: Fair  Appetite:  Fair  Current Medications:  Current Facility-Administered Medications  Medication Dose Route Frequency Provider Last Rate Last Admin   acetaminophen (TYLENOL) tablet 650 mg  650 mg Oral Q6H PRN Sindy Guadeloupe, NP       alum & mag hydroxide-simeth (MAALOX/MYLANTA) 200-200-20 MG/5ML suspension 30 mL  30 mL Oral Q4H PRN Sindy Guadeloupe, NP       chlordiazePOXIDE (LIBRIUM) capsule 25 mg  25 mg Oral Q6H PRN Starkes-Perry, Juel Burrow, FNP       chlordiazePOXIDE (LIBRIUM) capsule 25 mg  25 mg Oral TID Maryagnes Amos, FNP   25 mg at 11/23/23 1515   Followed by   Melene Muller ON 11/24/2023] chlordiazePOXIDE (LIBRIUM) capsule 25 mg  25 mg Oral BH-qamhs Starkes-Perry, Juel Burrow, FNP       Followed by   Melene Muller ON 11/25/2023] chlordiazePOXIDE (LIBRIUM) capsule 25 mg  25 mg Oral Daily Starkes-Perry, Juel Burrow, FNP       dicyclomine (BENTYL) tablet 20 mg  20 mg Oral Q6H PRN Sindy Guadeloupe, NP   20 mg at 11/22/23 1507   hydrOXYzine (ATARAX) tablet 25 mg  25 mg Oral Q6H PRN Sindy Guadeloupe, NP   25 mg at 11/23/23 0932   loperamide (IMODIUM) capsule 2-4 mg  2-4 mg Oral PRN Sindy Guadeloupe, NP       magnesium hydroxide (  MILK OF MAGNESIA) suspension 30 mL  30 mL Oral Daily PRN Sindy Guadeloupe, NP       methocarbamol (ROBAXIN) tablet 500 mg  500 mg Oral Q8H PRN Sindy Guadeloupe, NP   500 mg at 11/23/23 0932   multivitamin with minerals tablet 1 tablet  1 tablet Oral Daily Maryagnes Amos, FNP   1 tablet at 11/23/23 0930   naproxen (NAPROSYN) tablet 500 mg  500 mg Oral BID PRN Sindy Guadeloupe, NP   500 mg at 11/23/23 0932   OLANZapine (ZYPREXA) injection 10 mg  10 mg Intramuscular TID PRN Sindy Guadeloupe, NP        OLANZapine (ZYPREXA) injection 5 mg  5 mg Intramuscular TID PRN Sindy Guadeloupe, NP       OLANZapine zydis (ZYPREXA) disintegrating tablet 5 mg  5 mg Oral TID PRN Sindy Guadeloupe, NP       ondansetron (ZOFRAN-ODT) disintegrating tablet 4 mg  4 mg Oral Q6H PRN Sindy Guadeloupe, NP       thiamine (VITAMIN B1) injection 100 mg  100 mg Intramuscular Once Maryagnes Amos, FNP       Current Outpatient Medications  Medication Sig Dispense Refill   naloxone (NARCAN) nasal spray 4 mg/0.1 mL Place 1 spray into the nose once as needed (for an opioid crisis).     OVER THE COUNTER MEDICATION Take 1,000 mg by mouth every 6 (six) hours as needed (For pain). Tylenol Extra Strength Rapid Release Gels (Acetaminophen 500mg )     ADVIL 200 MG CAPS Take 1-2 capsules (200-400 mg total) by mouth every 6 (six) hours as needed (for pain or headaches). (Patient not taking: Reported on 11/22/2023) 40 capsule 0   methocarbamol (ROBAXIN) 500 MG tablet Take 1 tablet (500 mg total) by mouth every 8 (eight) hours as needed. (Patient not taking: Reported on 11/22/2023) 8 tablet 0    Labs  Lab Results:  Admission on 11/21/2023  Component Date Value Ref Range Status   WBC 11/21/2023 5.6  4.0 - 10.5 K/uL Final   RBC 11/21/2023 4.14 (L)  4.22 - 5.81 MIL/uL Final   Hemoglobin 11/21/2023 13.1  13.0 - 17.0 g/dL Final   HCT 82/95/6213 38.8 (L)  39.0 - 52.0 % Final   MCV 11/21/2023 93.7  80.0 - 100.0 fL Final   MCH 11/21/2023 31.6  26.0 - 34.0 pg Final   MCHC 11/21/2023 33.8  30.0 - 36.0 g/dL Final   RDW 08/65/7846 13.2  11.5 - 15.5 % Final   Platelets 11/21/2023 298  150 - 400 K/uL Final   nRBC 11/21/2023 0.0  0.0 - 0.2 % Final   Neutrophils Relative % 11/21/2023 83  % Final   Neutro Abs 11/21/2023 4.6  1.7 - 7.7 K/uL Final   Lymphocytes Relative 11/21/2023 14  % Final   Lymphs Abs 11/21/2023 0.8  0.7 - 4.0 K/uL Final   Monocytes Relative 11/21/2023 3  % Final   Monocytes Absolute 11/21/2023 0.2  0.1 - 1.0 K/uL Final    Eosinophils Relative 11/21/2023 0  % Final   Eosinophils Absolute 11/21/2023 0.0  0.0 - 0.5 K/uL Final   Basophils Relative 11/21/2023 0  % Final   Basophils Absolute 11/21/2023 0.0  0.0 - 0.1 K/uL Final   Immature Granulocytes 11/21/2023 0  % Final   Abs Immature Granulocytes 11/21/2023 0.01  0.00 - 0.07 K/uL Final   Performed at [facility], [address] [place], [place] [address]   Sodium 11/21/2023 135  135 - 145 mmol/L Final   Potassium 11/21/2023 4.2  3.5 - 5.1 mmol/L Final   Chloride 11/21/2023 99  98 - 111 mmol/L Final   CO2 11/21/2023 26  22 - 32 mmol/L Final   Glucose, Bld 11/21/2023 162 (H)  70 - 99 mg/dL Final   Glucose reference range applies only to samples taken after fasting for at least 8 hours.   BUN 11/21/2023 7  6 - 20 mg/dL Final   Creatinine, Ser 11/21/2023 0.63  0.61 - 1.24 mg/dL Final   Calcium 40/34/7425 8.9  8.9 - 10.3 mg/dL Final   Total Protein 95/63/8756 8.1  6.5 - 8.1 g/dL Final   Albumin 43/32/9518 2.8 (L)  3.5 - 5.0 g/dL Final   AST 84/16/6063 91 (H)  15 - 41 U/L Final   ALT 11/21/2023 55 (H)  0 - 44 U/L Final   Alkaline Phosphatase 11/21/2023 87  38 - 126 U/L Final   Total Bilirubin 11/21/2023 0.8  0.0 - 1.2 mg/dL Final   GFR, Estimated 11/21/2023 >60  >60 mL/min Final   Comment: (NOTE) Calculated using the CKD-EPI Creatinine Equation (2021)    Anion gap 11/21/2023 10  5 - 15 Final   Performed at [facility], [address] [place], [place] [address]   Alcohol, Ethyl (B) 11/21/2023 <10  <10 mg/dL Final   Comment: (NOTE) Lowest detectable limit for serum alcohol is 10 mg/dL.  For medical purposes only. Performed at [facility], [address], [place], [place] [address]    TSH 11/21/2023 0.365  0.350 - 4.500 uIU/mL Final   Comment: Performed by a 3rd Generation assay with a functional sensitivity of <=0.01 uIU/mL. Performed at [facility], [address], [place], [place] [address]    POC Amphetamine UR 11/22/2023  None Detected  NONE DETECTED (Cut Off Level 1000 ng/mL) Final   POC Secobarbital (BAR) 11/22/2023 None Detected  NONE DETECTED (Cut Off Level 300 ng/mL) Final   POC Buprenorphine (BUP) 11/22/2023 None Detected  NONE DETECTED (Cut Off Level 10 ng/mL) Final   POC Oxazepam (BZO) 11/22/2023 Positive (A)  NONE DETECTED (Cut Off Level 300 ng/mL) Final   POC Cocaine UR 11/22/2023 Positive (A)  NONE DETECTED (Cut Off Level 300 ng/mL) Final   POC Methamphetamine UR 11/22/2023 None Detected  NONE DETECTED (Cut Off Level 1000 ng/mL) Final   POC Morphine 11/22/2023 None Detected  NONE DETECTED (Cut Off Level 300 ng/mL) Final   POC Methadone UR 11/22/2023 None Detected  NONE DETECTED (Cut Off Level 300 ng/mL) Final   POC Oxycodone UR 11/22/2023 None Detected  NONE DETECTED (Cut Off Level 100 ng/mL) Final   POC Marijuana UR 11/22/2023 Positive (A)  NONE DETECTED (Cut Off Level 50 ng/mL) Final  Admission on 11/19/2023, Discharged on 11/20/2023  Component Date Value Ref Range Status   Sodium 11/19/2023 138  135 - 145 mmol/L Final   Potassium 11/19/2023 3.4 (L)  3.5 - 5.1 mmol/L Final   Chloride 11/19/2023 103  98 - 111 mmol/L Final   CO2 11/19/2023 26  22 - 32 mmol/L Final   Glucose, Bld 11/19/2023 123 (H)  70 - 99 mg/dL Final   Glucose reference range applies only to samples taken after fasting for at least 8 hours.   BUN 11/19/2023 <5 (L)  6 - 20 mg/dL Final   Creatinine, Ser 11/19/2023 0.50 (L)  0.61 - 1.24 mg/dL Final   Calcium 20/25/4270 8.2 (L)  8.9 - 10.3  mg/dL Final   GFR, Estimated 11/19/2023 >60  >60 mL/min Final   Comment: (NOTE) Calculated using the CKD-EPI Creatinine Equation (2021)    Anion gap 11/19/2023 9  5 - 15 Final   Performed at [facility], [address] [place], [place] [address]   WBC 11/19/2023 8.4  4.0 - 10.5 K/uL Final   RBC 11/19/2023 4.00 (L)  4.22 - 5.81 MIL/uL Final   Hemoglobin 11/19/2023 12.6 (L)  13.0 - 17.0 g/dL Final   HCT 14/78/2956 37.7 (L)   39.0 - 52.0 % Final   MCV 11/19/2023 94.3  80.0 - 100.0 fL Final   MCH 11/19/2023 31.5  26.0 - 34.0 pg Final   MCHC 11/19/2023 33.4  30.0 - 36.0 g/dL Final   RDW 21/30/8657 13.2  11.5 - 15.5 % Final   Platelets 11/19/2023 267  150 - 400 K/uL Final   nRBC 11/19/2023 0.0  0.0 - 0.2 % Final   Neutrophils Relative % 11/19/2023 65  % Final   Neutro Abs 11/19/2023 5.5  1.7 - 7.7 K/uL Final   Lymphocytes Relative 11/19/2023 22  % Final   Lymphs Abs 11/19/2023 1.9  0.7 - 4.0 K/uL Final   Monocytes Relative 11/19/2023 11  % Final   Monocytes Absolute 11/19/2023 0.9  0.1 - 1.0 K/uL Final   Eosinophils Relative 11/19/2023 1  % Final   Eosinophils Absolute 11/19/2023 0.1  0.0 - 0.5 K/uL Final   Basophils Relative 11/19/2023 1  % Final   Basophils Absolute 11/19/2023 0.1  0.0 - 0.1 K/uL Final   Immature Granulocytes 11/19/2023 0  % Final   Abs Immature Granulocytes 11/19/2023 0.03  0.00 - 0.07 K/uL Final   Performed at [facility], [address] [place], [place] [address]   Sed Rate 11/19/2023 77 (H)  0 - 16 mm/hr Final   Performed at [facility], [address] [place], [place] [address]   CRP 11/19/2023 4.2 (H)  <1.0 mg/dL Final   Performed at [facility], [address], [place], [place] [address]   Sodium 11/19/2023 141  135 - 145 mmol/L Final   Potassium 11/19/2023 3.5  3.5 - 5.1 mmol/L Final   Chloride 11/19/2023 101  98 - 111 mmol/L Final   BUN 11/19/2023 <3 (L)  6 - 20 mg/dL Final   Creatinine, Ser 11/19/2023 0.70  0.61 - 1.24 mg/dL Final   Glucose, Bld 40/07/2724 126 (H)  70 - 99 mg/dL Final   Glucose reference range applies only to samples taken after fasting for at least 8 hours.   Calcium, Ion 11/19/2023 1.05 (L)  1.15 - 1.40 mmol/L Final   TCO2 11/19/2023 27  22 - 32 mmol/L Final   Hemoglobin 11/19/2023 12.9 (L)  13.0 - 17.0 g/dL Final   HCT 36/64/4034 38.0 (L)  39.0 - 52.0 % Final   SARS Coronavirus 2 by RT PCR 11/19/2023 NEGATIVE   NEGATIVE Final   Comment: (NOTE) SARS-CoV-2 target nucleic acids are NOT DETECTED.  The SARS-CoV-2 RNA is generally detectable in upper respiratory specimens during the acute phase of infection. The lowest concentration of SARS-CoV-2 viral copies this assay can detect is 138 copies/mL. A negative result does not preclude SARS-Cov-2 infection and should not be used as the sole basis for treatment or other patient management decisions. A negative result may occur with  improper specimen collection/handling, submission of specimen other than nasopharyngeal swab, presence of viral mutation(s) within the areas targeted  by this assay, and inadequate number of viral copies(<138 copies/mL). A negative result must be combined with clinical observations, patient history, and epidemiological information. The expected result is Negative.  Fact Sheet for Patients:  BloggerCourse.com  Fact Sheet for Healthcare Providers:  SeriousBroker.it  This test is no                          t yet approved or cleared by the [place] FDA and  has been authorized for detection and/or diagnosis of SARS-CoV-2 by FDA under an Emergency Use Authorization (EUA). This EUA will remain  in effect (meaning this test can be used) for the duration of the COVID-19 declaration under Section 564(b)(1) of the Act, 21 U.S.C.section 360bbb-3(b)(1), unless the authorization is terminated  or revoked sooner.       Influenza A by PCR 11/19/2023 NEGATIVE  NEGATIVE Final   Influenza B by PCR 11/19/2023 NEGATIVE  NEGATIVE Final   Comment: (NOTE) The Xpert Xpress SARS-CoV-2/FLU/RSV plus assay is intended as an aid in the diagnosis of influenza from Nasopharyngeal swab specimens and should not be used as a sole basis for treatment. Nasal washings and aspirates are unacceptable for Xpert Xpress SARS-CoV-2/FLU/RSV testing.  Fact Sheet for  Patients: BloggerCourse.com  Fact Sheet for Healthcare Providers: SeriousBroker.it  This test is not yet approved or cleared by the [place] FDA and has been authorized for detection and/or diagnosis of SARS-CoV-2 by FDA under an Emergency Use Authorization (EUA). This EUA will remain in effect (meaning this test can be used) for the duration of the COVID-19 declaration under Section 564(b)(1) of the Act, 21 U.S.C. section 360bbb-3(b)(1), unless the authorization is terminated or revoked.     Resp Syncytial Virus by PCR 11/19/2023 NEGATIVE  NEGATIVE Final   Comment: (NOTE) Fact Sheet for Patients: BloggerCourse.com  Fact Sheet for Healthcare Providers: SeriousBroker.it  This test is not yet approved or cleared by the [place] FDA and has been authorized for detection and/or diagnosis of SARS-CoV-2 by FDA under an Emergency Use Authorization (EUA). This EUA will remain in effect (meaning this test can be used) for the duration of the COVID-19 declaration under Section 564(b)(1) of the Act, 21 U.S.C. section 360bbb-3(b)(1), unless the authorization is terminated or revoked.  Performed at [facility], [address], [place], [place] [address]   Admission on 11/09/2023, Discharged on 11/09/2023  Component Date Value Ref Range Status   Color, Urine 11/09/2023 AMBER (A)  YELLOW Final   BIOCHEMICALS MAY BE AFFECTED BY COLOR   APPearance 11/09/2023 CLEAR  CLEAR Final   Specific Gravity, Urine 11/09/2023 1.036 (H)  1.005 - 1.030 Final   pH 11/09/2023 5.0  5.0 - 8.0 Final   Glucose, UA 11/09/2023 NEGATIVE  NEGATIVE mg/dL Final   Hgb urine dipstick 11/09/2023 NEGATIVE  NEGATIVE Final   Bilirubin Urine 11/09/2023 SMALL (A)  NEGATIVE Final   Ketones, ur 11/09/2023 NEGATIVE  NEGATIVE mg/dL Final   Protein, ur 60/45/4098 30 (A)  NEGATIVE mg/dL Final   Nitrite  11/91/4782 NEGATIVE  NEGATIVE Final   Leukocytes,Ua 11/09/2023 NEGATIVE  NEGATIVE Final   RBC / HPF 11/09/2023 0-5  0 - 5 RBC/hpf Final   WBC, UA 11/09/2023 0-5  0 - 5 WBC/hpf Final   Bacteria, UA 11/09/2023 NONE SEEN  NONE SEEN Final   Squamous Epithelial / HPF 11/09/2023 0-5  0 - 5 /HPF Final   Mucus 11/09/2023 PRESENT   Final   Performed  at [facility], [address] [place], [place] [address]   Opiates 11/09/2023 POSITIVE (A)  NONE DETECTED Final   Cocaine 11/09/2023 POSITIVE (A)  NONE DETECTED Final   Benzodiazepines 11/09/2023 POSITIVE (A)  NONE DETECTED Final   Amphetamines 11/09/2023 NONE DETECTED  NONE DETECTED Final   Tetrahydrocannabinol 11/09/2023 POSITIVE (A)  NONE DETECTED Final   Barbiturates 11/09/2023 NONE DETECTED  NONE DETECTED Final   Comment: (NOTE) DRUG SCREEN FOR MEDICAL PURPOSES ONLY.  IF CONFIRMATION IS NEEDED FOR ANY PURPOSE, NOTIFY LAB WITHIN 5 DAYS.  LOWEST DETECTABLE LIMITS FOR URINE DRUG SCREEN Drug Class                     Cutoff (ng/mL) Amphetamine and metabolites    1000 Barbiturate and metabolites    200 Benzodiazepine                 200 Opiates and metabolites        300 Cocaine and metabolites        300 THC                            50 Performed at [facility], [address], [place], [place] [address]   Admission on 08/10/2023, Discharged on 08/14/2023  Component Date Value Ref Range Status   Sodium 08/10/2023 137  135 - 145 mmol/L Final   Potassium 08/10/2023 3.4 (L)  3.5 - 5.1 mmol/L Final   Chloride 08/10/2023 100  98 - 111 mmol/L Final   CO2 08/10/2023 25  22 - 32 mmol/L Final   Glucose, Bld 08/10/2023 109 (H)  70 - 99 mg/dL Final   Glucose reference range applies only to samples taken after fasting for at least 8 hours.   BUN 08/10/2023 11  6 - 20 mg/dL Final   Creatinine, Ser 08/10/2023 0.71  0.61 - 1.24 mg/dL Final   Calcium 09/81/1914 8.8 (L)  8.9 - 10.3 mg/dL Final   Total Protein  08/10/2023 7.9  6.5 - 8.1 g/dL Final   Albumin 78/29/5621 3.8  3.5 - 5.0 g/dL Final   AST 30/86/5784 466 (H)  15 - 41 U/L Final   ALT 08/10/2023 264 (H)  0 - 44 U/L Final   Alkaline Phosphatase 08/10/2023 112  38 - 126 U/L Final   Total Bilirubin 08/10/2023 1.2 (H)  <1.2 mg/dL Final   GFR, Estimated 08/10/2023 >60  >60 mL/min Final   Comment: (NOTE) Calculated using the CKD-EPI Creatinine Equation (2021)    Anion gap 08/10/2023 12  5 - 15 Final   Performed at [facility], [address], [place], [place] [address]   Alcohol, Ethyl (B) 08/10/2023 144 (H)  <10 mg/dL Final   Comment: (NOTE) Lowest detectable limit for serum alcohol is 10 mg/dL.  For medical purposes only. Performed at [facility], [address], [place], [place] [address]    Salicylate Lvl 08/10/2023 <7.0 (L)  7.0 - 30.0 mg/dL Final   Performed at [facility], [address] [place], [place] [address]   Acetaminophen (Tylenol), Serum 08/10/2023 <10 (L)  10 - 30 ug/mL Final   Comment: (NOTE) Therapeutic concentrations vary significantly. A range of 10-30 ug/mL  may be an effective concentration for many patients. However, some  are best treated at concentrations outside of this range. Acetaminophen concentrations >150 ug/mL at 4 hours after ingestion  and >50 ug/mL  at 12 hours after ingestion are often associated with  toxic reactions.  Performed at [facility], [address], [place], [place] [address]    WBC 08/10/2023 8.0  4.0 - 10.5 K/uL Final   RBC 08/10/2023 4.13 (L)  4.22 - 5.81 MIL/uL Final   Hemoglobin 08/10/2023 13.8  13.0 - 17.0 g/dL Final   HCT 62/13/0865 39.4  39.0 - 52.0 % Final   MCV 08/10/2023 95.4  80.0 - 100.0 fL Final   MCH 08/10/2023 33.4  26.0 - 34.0 pg Final   MCHC 08/10/2023 35.0  30.0 - 36.0 g/dL Final   RDW 78/46/9629 13.1  11.5 - 15.5 % Final   Platelets 08/10/2023 226  150 - 400 K/uL Final   nRBC  08/10/2023 0.0  0.0 - 0.2 % Final   Performed at [facility], [address], [place], [place] [address]   Opiates 08/10/2023 NONE DETECTED  NONE DETECTED Final   Cocaine 08/10/2023 POSITIVE (A)  NONE DETECTED Final   Benzodiazepines 08/10/2023 NONE DETECTED  NONE DETECTED Final   Amphetamines 08/10/2023 POSITIVE (A)  NONE DETECTED Final   Tetrahydrocannabinol 08/10/2023 POSITIVE (A)  NONE DETECTED Final   Barbiturates 08/10/2023 NONE DETECTED  NONE DETECTED Final   Comment: (NOTE) DRUG SCREEN FOR MEDICAL PURPOSES ONLY.  IF CONFIRMATION IS NEEDED FOR ANY PURPOSE, NOTIFY LAB WITHIN 5 DAYS.  LOWEST DETECTABLE LIMITS FOR URINE DRUG SCREEN Drug Class                     Cutoff (ng/mL) Amphetamine and metabolites    1000 Barbiturate and metabolites    200 Benzodiazepine                 200 Opiates and metabolites        300 Cocaine and metabolites        300 THC                            50 Performed at [facility], [address] [place], [place] [address]    Total CK 08/10/2023 461 (H)  49 - 397 U/L Final   Performed at [facility], [address] [place], [place] [address]   Hepatitis B Surface Ag 08/11/2023 NON REACTIVE  NON REACTIVE Final   HCV Ab 08/11/2023 Reactive (A)  NON REACTIVE Final   Comment: (NOTE) The CDC recommends that a Reactive HCV antibody result be followed up  with a HCV Nucleic Acid Amplification test.     Hep A IgM 08/11/2023 NON REACTIVE  NON REACTIVE Final   Hep B C IgM 08/11/2023 NON REACTIVE  NON REACTIVE Final   Performed at [facility], [address] [place], [place] [address]   Total Protein 08/12/2023 7.4  6.5 - 8.1 g/dL Final   Albumin 40/34/7425 3.2 (L)  3.5 - 5.0 g/dL Final   AST 95/63/8756 381 (H)  15 - 41 U/L Final   ALT 08/12/2023 247 (H)  0 - 44 U/L Final   Alkaline Phosphatase 08/12/2023 97  38 - 126 U/L Final   Total Bilirubin 08/12/2023 1.0  <1.2 mg/dL Final    Bilirubin, Direct 08/12/2023 0.2  0.0 - 0.2 mg/dL Final   Indirect Bilirubin 08/12/2023 0.8  0.3 - 0.9 mg/dL Final   Performed at [facility], [address] [place], [place] [address]   Total CK 08/12/2023 41 (  L)  49 - 397 U/L Final   Performed at [facility], [address], [place], [place] [address]   Ammonia 08/12/2023 48 (H)  9 - 35 umol/L Final   Performed at [facility], [address], [place], [place] [address]  Admission on 08/04/2023, Discharged on 08/07/2023  Component Date Value Ref Range Status   Amphetamines, Urine 08/04/2023 Negative  Cutoff=1000 ng/mL Final   Amphetamine test includes Amphetamine and Methamphetamine.   Barbiturate, Ur 08/04/2023 Negative  Cutoff=300 ng/mL Final   Benzodiazepine Quant, Ur 08/04/2023 Negative  Cutoff=300 ng/mL Final   Cannabinoid Quant, Ur 08/04/2023 Negative  Cutoff=50 ng/mL Final   Cocaine (Metab.) 08/04/2023 See Final Results  Cutoff=300 ng/mL Final   Opiate Quant, Ur 08/04/2023 Negative  Cutoff=300 ng/mL Final   Opiate test includes Codeine and Morphine only.   Phencyclidine, Ur 08/04/2023 Negative  Cutoff=25 ng/mL Final   Methadone Screen, Urine 08/04/2023 Negative  Cutoff=300 ng/mL Final   Propoxyphene, Urine 08/04/2023 Negative  Cutoff=300 ng/mL Final   Ethanol U, Cloretta Ned 08/04/2023 See Final Results  Cutoff=0.020 % Final   Comment: (NOTE) Performed At: [facility] [id number] [place], [place] [id number] Avis Epley PhD ON:[id number]    WBC 08/04/2023 5.8  4.0 - 10.5 K/uL Final   RBC 08/04/2023 3.93 (L)  4.22 - 5.81 MIL/uL Final   Hemoglobin 08/04/2023 12.9 (L)  13.0 - 17.0 g/dL Final   HCT 44/10/270 36.4 (L)  39.0 - 52.0 % Final   MCV 08/04/2023 92.6  80.0 - 100.0 fL Final   MCH 08/04/2023 32.8  26.0 - 34.0 pg Final   MCHC 08/04/2023 35.4  30.0 - 36.0 g/dL Final   RDW 53/66/4403 12.7  11.5 - 15.5 % Final   Platelets 08/04/2023 147 (L)  150 - 400 K/uL Final    Comment: SPECIMEN CHECKED FOR CLOTS REPEATED TO VERIFY PLATELET COUNT CONFIRMED BY SMEAR    nRBC 08/04/2023 0.0  0.0 - 0.2 % Final   Neutrophils Relative % 08/04/2023 76  % Final   Neutro Abs 08/04/2023 4.4  1.7 - 7.7 K/uL Final   Lymphocytes Relative 08/04/2023 16  % Final   Lymphs Abs 08/04/2023 0.9  0.7 - 4.0 K/uL Final   Monocytes Relative 08/04/2023 6  % Final   Monocytes Absolute 08/04/2023 0.3  0.1 - 1.0 K/uL Final   Eosinophils Relative 08/04/2023 0  % Final   Eosinophils Absolute 08/04/2023 0.0  0.0 - 0.5 K/uL Final   Basophils Relative 08/04/2023 1  % Final   Basophils Absolute 08/04/2023 0.0  0.0 - 0.1 K/uL Final   Immature Granulocytes 08/04/2023 1  % Final   Abs Immature Granulocytes 08/04/2023 0.04  0.00 - 0.07 K/uL Final   Performed at [facility], [address] [place], [place] [address]   Sodium 08/04/2023 134 (L)  135 - 145 mmol/L Final   Potassium 08/04/2023 3.0 (L)  3.5 - 5.1 mmol/L Final   Chloride 08/04/2023 95 (L)  98 - 111 mmol/L Final   CO2 08/04/2023 24  22 - 32 mmol/L Final   Glucose, Bld 08/04/2023 115 (H)  70 - 99 mg/dL Final   Glucose reference range applies only to samples taken after fasting for at least 8 hours.   BUN 08/04/2023 5 (L)  6 - 20 mg/dL Final   Creatinine, Ser 08/04/2023 0.56 (L)  0.61 - 1.24 mg/dL Final   Calcium 95/63/8756 8.2 (L)  8.9 - 10.3 mg/dL Final  Total Protein 08/04/2023 7.7  6.5 - 8.1 g/dL Final   Albumin 54/06/8118 3.6  3.5 - 5.0 g/dL Final   AST 14/78/2956 205 (H)  15 - 41 U/L Final   ALT 08/04/2023 99 (H)  0 - 44 U/L Final   Alkaline Phosphatase 08/04/2023 98  38 - 126 U/L Final   Total Bilirubin 08/04/2023 1.0  0.3 - 1.2 mg/dL Final   GFR, Estimated 08/04/2023 >60  >60 mL/min Final   Comment: (NOTE) Calculated using the CKD-EPI Creatinine Equation (2021)    Anion gap 08/04/2023 15  5 - 15 Final   Performed at [facility], [address] [place], [place] [address]   Hgb A1c MFr Bld 08/04/2023 5.4  4.8  - 5.6 % Final   Comment: (NOTE) Pre diabetes:          5.7%-6.4%  Diabetes:              >6.4%  Glycemic control for   <7.0% adults with diabetes    Mean Plasma Glucose 08/04/2023 108.28  mg/dL Final   Performed at [facility], [address] [place], [place] [address]   TSH 08/04/2023 0.613  0.350 - 4.500 uIU/mL Final   Comment: Performed by a 3rd Generation assay with a functional sensitivity of <=0.01 uIU/mL. Performed at [facility], [address] [place] [address], [place], [place] [address]    Neisseria Gonorrhea 08/04/2023 Negative   Final   Chlamydia 08/04/2023 Negative   Final   Comment 08/04/2023 Normal Reference Ranger Chlamydia - Negative   Final   Comment 08/04/2023 Normal Reference Range Neisseria Gonorrhea - Negative   Final   RPR Ser Ql 08/04/2023 NON REACTIVE  NON REACTIVE Final   Performed at [facility], [address] [place], [place] [address]   HIV-1 P24 Antigen - HIV24 08/04/2023 NON REACTIVE  NON REACTIVE Final   Comment: (NOTE) Detection of p24 may be inhibited by biotin in the sample, causing false negative results in acute infection.    HIV 1/2 Antibodies 08/04/2023 NON REACTIVE  NON REACTIVE Final   Interpretation (HIV Ag Ab) 08/04/2023 A non reactive test result means that HIV 1 or HIV 2 antibodies and HIV 1 p24 antigen were not detected in the specimen.   Final   Performed at [facility], [address] [place], [place] [address]   Hepatitis B Surface Ag 08/04/2023 NON REACTIVE  NON REACTIVE Final   HCV Ab 08/04/2023 Reactive (A)  NON REACTIVE Final   Comment: (NOTE) The CDC recommends that a Reactive HCV antibody result be followed up  with a HCV Nucleic Acid Amplification test.     Hep A IgM 08/04/2023 NON REACTIVE  NON REACTIVE Final   Hep B C IgM 08/04/2023 NON REACTIVE  NON REACTIVE Final   Performed at [facility], [address] [place], [place] [address]   POC Amphetamine UR 08/04/2023 None Detected  NONE DETECTED (Cut  Off Level 1000 ng/mL) Final   POC Secobarbital (BAR) 08/04/2023 None Detected  NONE DETECTED (Cut Off Level 300 ng/mL) Final   POC Buprenorphine (BUP) 08/04/2023 None Detected  NONE DETECTED (Cut Off Level 10 ng/mL) Final   POC Oxazepam (BZO) 08/04/2023 None Detected  NONE DETECTED (Cut Off Level 300 ng/mL) Final   POC Cocaine UR 08/04/2023 Positive (A)  NONE DETECTED (Cut Off Level 300 ng/mL) Final   POC Methamphetamine UR 08/04/2023 None Detected  NONE DETECTED (Cut Off Level  1000 ng/mL) Final   POC Morphine 08/04/2023 None Detected  NONE DETECTED (Cut Off Level 300 ng/mL) Final   POC Methadone UR 08/04/2023 None Detected  NONE DETECTED (Cut Off Level 300 ng/mL) Final   POC Oxycodone UR 08/04/2023 None Detected  NONE DETECTED (Cut Off Level 100 ng/mL) Final   POC Marijuana UR 08/04/2023 Positive (A)  NONE DETECTED (Cut Off Level 50 ng/mL) Final   Cocaine Metab Quant, Ur 08/04/2023 Positive (A)  Cutoff=300 Final   Benzoylecgonine GC/MS Conf 08/04/2023 35,080  Cutoff=150 ng/mL Final   Comment: (NOTE) Performed At: [facility] [address] [place], [place] [id number] Avis Epley PhD [id number]    Ethanol, Urine 08/04/2023 FID) (A)  Cutoff=0.020 Final   Comment: Positive Confirmation performed by Gas Chromatography (GC    Ethanol, Ur - Confirmation 08/04/2023 0.099  Cutoff=0.020 % Final   Comment: (NOTE) GLUCOSE NEGATIVE Performed At: [facility] [address] [place], [place] [id number] Avis Epley PhD [id number]   Admission on 06/15/2023, Discharged on 06/16/2023  Component Date Value Ref Range Status   WBC 06/15/2023 5.8  4.0 - 10.5 K/uL Final   RBC 06/15/2023 4.61  4.22 - 5.81 MIL/uL Final   Hemoglobin 06/15/2023 15.5  13.0 - 17.0 g/dL Final   HCT 40/07/2724 46.0  39.0 - 52.0 % Final   MCV 06/15/2023 99.8  80.0 - 100.0 fL Final   MCH 06/15/2023 33.6  26.0 - 34.0 pg Final   MCHC 06/15/2023 33.7  30.0 - 36.0 g/dL Final   RDW 36/64/4034 13.3  11.5 - 15.5 % Final    Platelets 06/15/2023 244  150 - 400 K/uL Final   nRBC 06/15/2023 0.0  0.0 - 0.2 % Final   Neutrophils Relative % 06/15/2023 56  % Final   Neutro Abs 06/15/2023 3.3  1.7 - 7.7 K/uL Final   Lymphocytes Relative 06/15/2023 33  % Final   Lymphs Abs 06/15/2023 1.9  0.7 - 4.0 K/uL Final   Monocytes Relative 06/15/2023 6  % Final   Monocytes Absolute 06/15/2023 0.4  0.1 - 1.0 K/uL Final   Eosinophils Relative 06/15/2023 2  % Final   Eosinophils Absolute 06/15/2023 0.1  0.0 - 0.5 K/uL Final   Basophils Relative 06/15/2023 3  % Final   Basophils Absolute 06/15/2023 0.2 (H)  0.0 - 0.1 K/uL Final   Immature Granulocytes 06/15/2023 0  % Final   Abs Immature Granulocytes 06/15/2023 0.01  0.00 - 0.07 K/uL Final   Performed at [facility], [address], [place], [place] [address]   Sodium 06/15/2023 140  135 - 145 mmol/L Final   Potassium 06/15/2023 3.8  3.5 - 5.1 mmol/L Final   Chloride 06/15/2023 103  98 - 111 mmol/L Final   CO2 06/15/2023 28  22 - 32 mmol/L Final   Glucose, Bld 06/15/2023 108 (H)  70 - 99 mg/dL Final   Glucose reference range applies only to samples taken after fasting for at least 8 hours.   BUN 06/15/2023 5 (L)  6 - 20 mg/dL Final   Creatinine, Ser 06/15/2023 0.65  0.61 - 1.24 mg/dL Final   Calcium 56/38/7564 8.3 (L)  8.9 - 10.3 mg/dL Final   Total Protein 33/29/5188 8.6 (H)  6.5 - 8.1 g/dL Final   Albumin 41/66/0630 3.6  3.5 - 5.0 g/dL Final   AST 16/10/930 296 (H)  15 - 41 U/L Final   ALT 06/15/2023 205 (H)  0 - 44 U/L Final  Alkaline Phosphatase 06/15/2023 145 (H)  38 - 126 U/L Final   Total Bilirubin 06/15/2023 0.4  0.3 - 1.2 mg/dL Final   GFR, Estimated 06/15/2023 >60  >60 mL/min Final   Comment: (NOTE) Calculated using the CKD-EPI Creatinine Equation (2021)    Anion gap 06/15/2023 9  5 - 15 Final   Performed at [facility], [address], [place], [place] [address]   Lipase 06/15/2023 27  11 - 51 U/L Final   Performed  at [facility], [address], [place], [place] [address]   Specimen Source 06/16/2023 URINE, CLEAN CATCH   Final   Color, Urine 06/16/2023 YELLOW  YELLOW Final   APPearance 06/16/2023 HAZY (A)  CLEAR Final   Specific Gravity, Urine 06/16/2023 1.015  1.005 - 1.030 Final   pH 06/16/2023 6.0  5.0 - 8.0 Final   Glucose, UA 06/16/2023 NEGATIVE  NEGATIVE mg/dL Final   Hgb urine dipstick 06/16/2023 NEGATIVE  NEGATIVE Final   Bilirubin Urine 06/16/2023 NEGATIVE  NEGATIVE Final   Ketones, ur 06/16/2023 NEGATIVE  NEGATIVE mg/dL Final   Protein, ur 84/13/2440 NEGATIVE  NEGATIVE mg/dL Final   Nitrite 07/30/2535 NEGATIVE  NEGATIVE Final   Leukocytes,Ua 06/16/2023 NEGATIVE  NEGATIVE Final   RBC / HPF 06/16/2023 0-5  0 - 5 RBC/hpf Final   WBC, UA 06/16/2023 0-5  0 - 5 WBC/hpf Final   Comment:        Reflex urine culture not performed if WBC <=10, OR if Squamous epithelial cells >5. If Squamous epithelial cells >5 suggest recollection.    Bacteria, UA 06/16/2023 NONE SEEN  NONE SEEN Final   Squamous Epithelial / HPF 06/16/2023 0-5  0 - 5 /HPF Final   Mucus 06/16/2023 PRESENT   Final   Performed at [facility], [address], [place], [place] [address]   Alcohol, Ethyl (B) 06/15/2023 288 (H)  <10 mg/dL Final   Comment: (NOTE) Lowest detectable limit for serum alcohol is 10 mg/dL.  For medical purposes only. Performed at [facility], [address], [place], [place] [address]    Salicylate Lvl 06/15/2023 <7.0 (L)  7.0 - 30.0 mg/dL Final   Performed at [facility], [address], [place], [place] [address]   Acetaminophen (Tylenol), Serum 06/15/2023 <10 (L)  10 - 30 ug/mL Final   Comment: (NOTE) Therapeutic concentrations vary significantly. A range of 10-30 ug/mL  may be an effective concentration for many patients. However, some  are best treated at concentrations outside of this range. Acetaminophen  concentrations >150 ug/mL at 4 hours after ingestion  and >50 ug/mL at 12 hours after ingestion are often associated with  toxic reactions.  Performed at [facility], [address], [place], [place] [address]    Opiates 06/16/2023 NONE DETECTED  NONE DETECTED Final   Cocaine 06/16/2023 POSITIVE (A)  NONE DETECTED Final   Benzodiazepines 06/16/2023 NONE DETECTED  NONE DETECTED Final   Amphetamines 06/16/2023 NONE DETECTED  NONE DETECTED Final   Tetrahydrocannabinol 06/16/2023 POSITIVE (A)  NONE DETECTED Final   Barbiturates 06/16/2023 NONE DETECTED  NONE DETECTED Final   Comment: (NOTE) DRUG SCREEN FOR MEDICAL PURPOSES ONLY.  IF CONFIRMATION IS NEEDED FOR ANY PURPOSE, NOTIFY LAB WITHIN 5 DAYS.  LOWEST DETECTABLE LIMITS FOR URINE DRUG SCREEN Drug Class                     Cutoff (ng/mL) Amphetamine and metabolites  1000 Barbiturate and metabolites    200 Benzodiazepine                 200 Opiates and metabolites        300 Cocaine and metabolites        300 THC                            50 Performed at [facility], [address], [place], [place] [address]   Admission on 06/09/2023, Discharged on 06/10/2023  Component Date Value Ref Range Status   Lipase 06/09/2023 38  11 - 51 U/L Final   Performed at [facility], [address], [place], [place] [address]   Sodium 06/09/2023 137  135 - 145 mmol/L Final   Potassium 06/09/2023 3.6  3.5 - 5.1 mmol/L Final   Chloride 06/09/2023 100  98 - 111 mmol/L Final   CO2 06/09/2023 25  22 - 32 mmol/L Final   Glucose, Bld 06/09/2023 140 (H)  70 - 99 mg/dL Final   Glucose reference range applies only to samples taken after fasting for at least 8 hours.   BUN 06/09/2023 14  6 - 20 mg/dL Final   Creatinine, Ser 06/09/2023 0.75  0.61 - 1.24 mg/dL Final   Calcium 82/95/6213 8.6 (L)  8.9 - 10.3 mg/dL Final   Total Protein 08/65/7846 8.4 (H)  6.5 - 8.1 g/dL Final   Albumin  96/29/5284 3.6  3.5 - 5.0 g/dL Final   AST 13/24/4010 222 (H)  15 - 41 U/L Final   ALT 06/09/2023 176 (H)  0 - 44 U/L Final   Alkaline Phosphatase 06/09/2023 183 (H)  38 - 126 U/L Final   Total Bilirubin 06/09/2023 0.7  0.3 - 1.2 mg/dL Final   GFR, Estimated 06/09/2023 >60  >60 mL/min Final   Comment: (NOTE) Calculated using the CKD-EPI Creatinine Equation (2021)    Anion gap 06/09/2023 12  5 - 15 Final   Performed at [facility], [address] [place], [place] [address]   WBC 06/09/2023 10.3  4.0 - 10.5 K/uL Final   RBC 06/09/2023 4.30  4.22 - 5.81 MIL/uL Final   Hemoglobin 06/09/2023 14.7  13.0 - 17.0 g/dL Final   HCT 66/44/0347 42.1  39.0 - 52.0 % Final   MCV 06/09/2023 97.9  80.0 - 100.0 fL Final   MCH 06/09/2023 34.2 (H)  26.0 - 34.0 pg Final   MCHC 06/09/2023 34.9  30.0 - 36.0 g/dL Final   RDW 42/59/5638 12.8  11.5 - 15.5 % Final   Platelets 06/09/2023 315  150 - 400 K/uL Final   nRBC 06/09/2023 0.0  0.0 - 0.2 % Final   Performed at [facility], [address] [place], [place] [address]   Color, Urine 06/10/2023 YELLOW  YELLOW Final   APPearance 06/10/2023 CLEAR  CLEAR Final   Specific Gravity, Urine 06/10/2023 1.010  1.005 - 1.030 Final   pH 06/10/2023 5.0  5.0 - 8.0 Final   Glucose, UA 06/10/2023 NEGATIVE  NEGATIVE mg/dL Final   Hgb urine dipstick 06/10/2023 NEGATIVE  NEGATIVE Final   Bilirubin Urine 06/10/2023 NEGATIVE  NEGATIVE Final   Ketones, ur 06/10/2023 NEGATIVE  NEGATIVE mg/dL Final   Protein, ur 32/95/1884 NEGATIVE  NEGATIVE mg/dL Final   Nitrite 16/60/6301 NEGATIVE  NEGATIVE Final   Leukocytes,Ua 06/10/2023 NEGATIVE  NEGATIVE Final   Performed at [facility],  [address], [place], [place] [address]  Admission on 06/09/2023, Discharged on 06/09/2023  Component Date Value Ref Range Status   Opiates 06/09/2023 NONE DETECTED  NONE DETECTED Final   Cocaine 06/09/2023 POSITIVE (A)  NONE DETECTED Final    Benzodiazepines 06/09/2023 NONE DETECTED  NONE DETECTED Final   Amphetamines 06/09/2023 NONE DETECTED  NONE DETECTED Final   Tetrahydrocannabinol 06/09/2023 POSITIVE (A)  NONE DETECTED Final   Barbiturates 06/09/2023 NONE DETECTED  NONE DETECTED Final   Comment: (NOTE) DRUG SCREEN FOR MEDICAL PURPOSES ONLY.  IF CONFIRMATION IS NEEDED FOR ANY PURPOSE, NOTIFY LAB WITHIN 5 DAYS.  LOWEST DETECTABLE LIMITS FOR URINE DRUG SCREEN Drug Class                     Cutoff (ng/mL) Amphetamine and metabolites    1000 Barbiturate and metabolites    200 Benzodiazepine                 200 Opiates and metabolites        300 Cocaine and metabolites        300 THC                            50 Performed at [facility], [address], [place], [place] [address]    Color, Urine 06/09/2023 YELLOW  YELLOW Final   APPearance 06/09/2023 CLEAR  CLEAR Final   Specific Gravity, Urine 06/09/2023 1.016  1.005 - 1.030 Final   pH 06/09/2023 8.0  5.0 - 8.0 Final   Glucose, UA 06/09/2023 NEGATIVE  NEGATIVE mg/dL Final   Hgb urine dipstick 06/09/2023 NEGATIVE  NEGATIVE Final   Bilirubin Urine 06/09/2023 NEGATIVE  NEGATIVE Final   Ketones, ur 06/09/2023 NEGATIVE  NEGATIVE mg/dL Final   Protein, ur 30/86/5784 NEGATIVE  NEGATIVE mg/dL Final   Nitrite 69/62/9528 NEGATIVE  NEGATIVE Final   Leukocytes,Ua 06/09/2023 NEGATIVE  NEGATIVE Final   Performed at [facility], [address], [place], [place] [address]  Admission on 06/03/2023, Discharged on 06/05/2023  Component Date Value Ref Range Status   WBC 06/03/2023 6.4  4.0 - 10.5 K/uL Final   RBC 06/03/2023 3.88 (L)  4.22 - 5.81 MIL/uL Final   Hemoglobin 06/03/2023 13.3  13.0 - 17.0 g/dL Final   HCT 40/07/2724 38.0 (L)  39.0 - 52.0 % Final   MCV 06/03/2023 97.9  80.0 - 100.0 fL Final   MCH 06/03/2023 34.3 (H)  26.0 - 34.0 pg Final   MCHC 06/03/2023 35.0  30.0 - 36.0 g/dL Final   RDW 36/64/4034 13.0  11.5 - 15.5 % Final    Platelets 06/03/2023 233  150 - 400 K/uL Final   nRBC 06/03/2023 0.0  0.0 - 0.2 % Final   Neutrophils Relative % 06/03/2023 49  % Final   Neutro Abs 06/03/2023 3.2  1.7 - 7.7 K/uL Final   Lymphocytes Relative 06/03/2023 40  % Final   Lymphs Abs 06/03/2023 2.6  0.7 - 4.0 K/uL Final   Monocytes Relative 06/03/2023 7  % Final   Monocytes Absolute 06/03/2023 0.4  0.1 - 1.0 K/uL Final   Eosinophils Relative 06/03/2023 2  % Final   Eosinophils Absolute 06/03/2023 0.1  0.0 - 0.5 K/uL Final   Basophils Relative 06/03/2023 2  % Final   Basophils Absolute 06/03/2023 0.1  0.0 - 0.1 K/uL Final   Immature Granulocytes 06/03/2023 0  % Final   Abs Immature  Granulocytes 06/03/2023 0.02  0.00 - 0.07 K/uL Final   Performed at [facility], [address], [place], [place] [address]   Sodium 06/03/2023 140  135 - 145 mmol/L Final   Potassium 06/03/2023 3.4 (L)  3.5 - 5.1 mmol/L Final   Chloride 06/03/2023 104  98 - 111 mmol/L Final   CO2 06/03/2023 26  22 - 32 mmol/L Final   Glucose, Bld 06/03/2023 112 (H)  70 - 99 mg/dL Final   Glucose reference range applies only to samples taken after fasting for at least 8 hours.   BUN 06/03/2023 5 (L)  6 - 20 mg/dL Final   Creatinine, Ser 06/03/2023 1.04  0.61 - 1.24 mg/dL Final   Calcium 54/06/8118 8.3 (L)  8.9 - 10.3 mg/dL Final   Total Protein 14/78/2956 7.0  6.5 - 8.1 g/dL Final   Albumin 21/30/8657 3.2 (L)  3.5 - 5.0 g/dL Final   AST 84/69/6295 258 (H)  15 - 41 U/L Final   ALT 06/03/2023 186 (H)  0 - 44 U/L Final   Alkaline Phosphatase 06/03/2023 107  38 - 126 U/L Final   Total Bilirubin 06/03/2023 0.8  0.3 - 1.2 mg/dL Final   GFR, Estimated 06/03/2023 >60  >60 mL/min Final   Comment: (NOTE) Calculated using the CKD-EPI Creatinine Equation (2021)    Anion gap 06/03/2023 10  5 - 15 Final   Performed at [facility], [address] [place], [place] [address]   Alcohol, Ethyl (B) 06/03/2023 285 (H)  <10 mg/dL Final   Comment: (NOTE) Lowest  detectable limit for serum alcohol is 10 mg/dL.  For medical purposes only. Performed at [facility], [address] [place], [place] [address]    POC Amphetamine UR 06/03/2023 None Detected  NONE DETECTED (Cut Off Level 1000 ng/mL) Final   POC Secobarbital (BAR) 06/03/2023 None Detected  NONE DETECTED (Cut Off Level 300 ng/mL) Final   POC Buprenorphine (BUP) 06/03/2023 None Detected  NONE DETECTED (Cut Off Level 10 ng/mL) Final   POC Oxazepam (BZO) 06/03/2023 None Detected  NONE DETECTED (Cut Off Level 300 ng/mL) Final   POC Cocaine UR 06/03/2023 Positive (A)  NONE DETECTED (Cut Off Level 300 ng/mL) Final   POC Methamphetamine UR 06/03/2023 None Detected  NONE DETECTED (Cut Off Level 1000 ng/mL) Final   POC Morphine 06/03/2023 Positive (A)  NONE DETECTED (Cut Off Level 300 ng/mL) Final   POC Methadone UR 06/03/2023 None Detected  NONE DETECTED (Cut Off Level 300 ng/mL) Final   POC Oxycodone UR 06/03/2023 None Detected  NONE DETECTED (Cut Off Level 100 ng/mL) Final   POC Marijuana UR 06/03/2023 Positive (A)  NONE DETECTED (Cut Off Level 50 ng/mL) Final  Admission on 05/28/2023, Discharged on 05/28/2023  Component Date Value Ref Range Status   WBC 05/28/2023 11.2 (H)  4.0 - 10.5 K/uL Final   RBC 05/28/2023 4.18 (L)  4.22 - 5.81 MIL/uL Final   Hemoglobin 05/28/2023 14.3  13.0 - 17.0 g/dL Final   HCT 02/72/5366 41.5  39.0 - 52.0 % Final   MCV 05/28/2023 99.3  80.0 - 100.0 fL Final   MCH 05/28/2023 34.2 (H)  26.0 - 34.0 pg Final   MCHC 05/28/2023 34.5  30.0 - 36.0 g/dL Final   RDW 44/12/4740 13.2  11.5 - 15.5 % Final   Platelets 05/28/2023 244  150 - 400 K/uL Final   nRBC 05/28/2023 0.0  0.0 - 0.2 % Final   Neutrophils Relative %  05/28/2023 65  % Final   Neutro Abs 05/28/2023 7.3  1.7 - 7.7 K/uL Final   Lymphocytes Relative 05/28/2023 27  % Final   Lymphs Abs 05/28/2023 3.0  0.7 - 4.0 K/uL Final   Monocytes Relative 05/28/2023 6  % Final   Monocytes Absolute 05/28/2023 0.7  0.1 -  1.0 K/uL Final   Eosinophils Relative 05/28/2023 1  % Final   Eosinophils Absolute 05/28/2023 0.1  0.0 - 0.5 K/uL Final   Basophils Relative 05/28/2023 1  % Final   Basophils Absolute 05/28/2023 0.1  0.0 - 0.1 K/uL Final   Immature Granulocytes 05/28/2023 0  % Final   Abs Immature Granulocytes 05/28/2023 0.04  0.00 - 0.07 K/uL Final   Performed at [facility], [address], [place], [place] [address]   Sodium 05/28/2023 139  135 - 145 mmol/L Final   Potassium 05/28/2023 3.1 (L)  3.5 - 5.1 mmol/L Final   Chloride 05/28/2023 106  98 - 111 mmol/L Final   CO2 05/28/2023 22  22 - 32 mmol/L Final   Glucose, Bld 05/28/2023 100 (H)  70 - 99 mg/dL Final   Glucose reference range applies only to samples taken after fasting for at least 8 hours.   BUN 05/28/2023 6  6 - 20 mg/dL Final   Creatinine, Ser 05/28/2023 0.70  0.61 - 1.24 mg/dL Final   Calcium 86/57/8469 8.0 (L)  8.9 - 10.3 mg/dL Final   GFR, Estimated 05/28/2023 >60  >60 mL/min Final   Comment: (NOTE) Calculated using the CKD-EPI Creatinine Equation (2021)    Anion gap 05/28/2023 11  5 - 15 Final   Performed at [facility], [address] [place], [place] [address]   Alcohol, Ethyl (B) 05/28/2023 279 (H)  <10 mg/dL Final   Comment: (NOTE) Lowest detectable limit for serum alcohol is 10 mg/dL.  For medical purposes only. Performed at [facility], [address], [place], [place] [address]    Opiates 05/28/2023 POSITIVE (A)  NONE DETECTED Final   Cocaine 05/28/2023 POSITIVE (A)  NONE DETECTED Final   Benzodiazepines 05/28/2023 NONE DETECTED  NONE DETECTED Final   Amphetamines 05/28/2023 POSITIVE (A)  NONE DETECTED Final   Tetrahydrocannabinol 05/28/2023 POSITIVE (A)  NONE DETECTED Final   Barbiturates 05/28/2023 NONE DETECTED  NONE DETECTED Final   Comment: (NOTE) DRUG SCREEN FOR MEDICAL PURPOSES ONLY.  IF CONFIRMATION IS NEEDED FOR ANY PURPOSE, NOTIFY LAB WITHIN 5 DAYS.  LOWEST DETECTABLE LIMITS FOR  URINE DRUG SCREEN Drug Class                     Cutoff (ng/mL) Amphetamine and metabolites    1000 Barbiturate and metabolites    200 Benzodiazepine                 200 Opiates and metabolites        300 Cocaine and metabolites        300 THC                            50 Performed at [facility], [address], [place], [place] [address]   There may be more visits with results that are not included.    Blood Alcohol level:  Lab Results  Component Value Date   ETH <10 11/21/2023   ETH 144 (H) 08/10/2023    Metabolic Disorder Labs: Lab Results  Component  Value Date   HGBA1C 5.4 08/04/2023   MPG 108.28 08/04/2023   MPG 111 02/24/2023   Lab Results  Component Value Date   PROLACTIN 31.4 (H) 11/16/2019   Lab Results  Component Value Date   CHOL 147 02/24/2023   TRIG 96 02/24/2023   HDL 42 02/24/2023   CHOLHDL 3.5 02/24/2023   VLDL 19 02/24/2023   LDLCALC 86 02/24/2023   LDLCALC 57 01/14/2023    Therapeutic Lab Levels: No results found for: "LITHIUM" No results found for: "VALPROATE" No results found for: "CBMZ"  Physical Findings   AIMS    Flowsheet Row [facility]) [facility] Ad[facility] (Discharged) [facility] Ad[facility] (Discharged) [facility]  AIMS Total Score 0 0 0      AUDIT    Flowsheet Row [facility]) [facility] Ad[facility] (Discharged[facility] Ad[facility] (Discharged) from [facility]charged[facility]  Alcohol Use Disorder Identification Test Final Score (AUDIT) 26 26 13  37      PHQ2-9    Flowsheet Row ED from 11/21/2023 in [facility] ED from 06/03/2023 in [facility]  PHQ-2 Total Score 6 0  PHQ-9 Total Score 22 --      Flowsheet Row ED from 11/21/2023 in [facility] Most recent reading at 11/22/2023 12:50 AM ED from 11/21/2023 in [facility] Most recent reading at 11/21/2023  7:45 PM ED from 11/21/2023 in [facility] at [facility] Most recent reading at 11/21/2023 11:06 AM  C-SSRS RISK CATEGORY No Risk High Risk No Risk        Musculoskeletal  Strength & Muscle Tone: within normal limits Gait & Station: normal Patient leans: Right  Psychiatric Specialty Exam  Presentation  General Appearance:  Appropriate for Environment; Fairly Groomed  Eye Contact: Fair  Speech: Clear and Coherent; Normal Rate  Speech Volume: Normal  Handedness: Right   Mood and Affect  Mood: depression, anxiety  Affect: Congruent   Thought Process  Thought Processes: Coherent; Linear  Descriptions of Associations:Intact  Orientation:Full (Time, Place and Person)  Thought Content:Logical  Diagnosis of Schizophrenia or Schizoaffective disorder in past: No  Duration of Psychotic Symptoms: Less than six months   Hallucinations:Hallucinations: None  Ideas of Reference:None  Suicidal Thoughts:Suicidal Thoughts: No  Homicidal Thoughts:Homicidal Thoughts: No   Sensorium  Memory: Immediate Fair; Recent Fair; Remote Fair  Judgment: Fair  Insight: Fair   Art therapist  Concentration: Fair  Attention Span: Fair  Recall: [employer] of Knowledge: Fair  Language: Fair   Psychomotor Activity  Psychomotor Activity: No data recorded   Assets  Assets: Communication Skills; Desire for Improvement; Physical Health; Resilience   Sleep  Sleep: Sleep: Fair Number of Hours of Sleep: 6   Nutritional Assessment (For [facility] and [facility] admissions only) Has  the patient had a weight loss or gain of 10 pounds or more in the last 3 months?: No Has the patient had a decrease in food intake/or appetite?: No Does the patient have dental problems?: No Does the patient have eating habits or behaviors that may be indicators of an eating disorder including binging or inducing vomiting?: No  Has the patient recently lost weight without trying?: 0 Has the patient been eating poorly because of a decreased appetite?: 0 Malnutrition Screening Tool Score: 0    Physical Exam  Physical Exam Vitals and nursing note reviewed.  Constitutional:      General: He is not in acute distress. HENT:     Nose: No congestion.  Cardiovascular:     Rate and Rhythm: Normal rate.  Pulmonary:     Effort: No respiratory distress.  Neurological:     Mental Status: He is alert.  Psychiatric:        Attention and Perception: He does not perceive auditory or visual hallucinations.        Mood and Affect: Mood is anxious and depressed.        Behavior: Behavior normal. Behavior is not agitated or aggressive.        Thought Content: Thought content normal. Thought content is not paranoid or delusional. Thought content does not include homicidal or suicidal ideation.        Judgment: Judgment normal.    Review of Systems  Constitutional:  Negative for chills and fever.  Cardiovascular:  Negative for chest pain and palpitations.  Gastrointestinal:  Negative for abdominal pain, nausea and vomiting.  Musculoskeletal:  Positive for joint pain.  Neurological:  Negative for dizziness, tremors and headaches.  Psychiatric/Behavioral:  Positive for depression and substance abuse. Negative for hallucinations and suicidal ideas. The patient is nervous/anxious.   All other systems reviewed and are negative.  Blood pressure 132/82, pulse 96, temperature 99.6 F (37.6 C), temperature source Oral, resp. rate 17, SpO2 100%. There is no height or weight on file to calculate  BMI.  Treatment Plan Summary: Daily contact with patient to assess and evaluate symptoms and progress in treatment  #Alcohol Use Disorder -initiate CIWA protocol with chlordiazepoxide taper -Thiamine injection 100 mg once then multivitamin po daily  #History of Opoid Use - COWS protocol  PRNs -Acetaminophen 650 mg as needed q6hrs for mild pain -Maalox/Mylanta 30 ml as needed q4hrs for indigestion -dicyclomine 20 mg as needed q6hrs for abdominal cramping -hydroxyzine 25 mg as needed for anxiety -loperamide 2-4 mg as needed diarrhea -Magnesium Hydroxide suspension 30 ml as needed for constipation -Naproxen tab 500 mg BID as needed aching/pain -Methocarbamol tab 500mg  as need for muscle spasms  -Ondansetron disintegrating tab 4 mg q6hrs as needed for nausea  Labs reviewed and determined to be within normal limtis. UDS positive for BZD(received ativan while in the ED on 02/16). Albumin is low, receommend increase in protein intake.  Midely elevated AST/ALT-follow up with PCP, likely will return to normal following cessation of alcohol and illicit substances. UDS positive for BZD, Cocaine and THC.   Disposition: Patient meet criteria for inpatient rehab. LCSW has secured placement at [facility].    Maryagnes Amos, FNP 11/23/2023 6:48 PM

## 2023-11-23 NOTE — Group Note (Signed)
 Group Topic: Communication  Group Date: 11/23/2023 Start Time: 2200 End Time: 2300 Facilitators: Rae Lips B  Department: [facility]  Number of Participants: 5  Group Focus: check in, communication, and daily focus Treatment Modality:  Individual Therapy Interventions utilized were exploration, leisure development, problem solving, story telling, and support Purpose: express feelings and increase insight  Name: Mark Hammond Date of Birth: [birth year]  MR: [id number]    Level of Participation:patient did not attend group  Quality of Participation: cooperative Interactions with others: gave feedback Mood/Affect: appropriate Triggers (if applicable): n/a Cognition: coherent/clear Progress: None Response n/a Plan: patient will be encouraged to to keep attending group  Patients Problems:  Patient Active Problem List   Diagnosis Date Noted   Polysubstance abuse (HCC) 11/21/2023   Alcohol use disorder 08/04/2023   Stimulant use disorder 08/04/2023   Opioid use disorder 08/04/2023   Tobacco use disorder 08/04/2023   Cannabis use disorder 08/04/2023   Hepatitis C 03/01/2023   Anxiety state 01/14/2023   Insomnia 01/14/2023   Schizoaffective disorder (HCC) 01/13/2023   Schizophrenia (HCC) 11/15/2019   Polysubstance (including opioids) dependence, daily use (HCC)    Substance induced mood disorder (HCC) 10/24/2019   Methamphetamine use disorder, 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

## 2023-11-23 NOTE — ED Notes (Signed)
 Patient was awake to eat breakfast and then returned to room to rest.  No complaints or withdrawal.  Patient walks with care.  Will monitor.

## 2023-11-24 DIAGNOSIS — F1010 Alcohol abuse, uncomplicated: Secondary | ICD-10-CM | POA: Diagnosis not present

## 2023-11-24 MED ORDER — GABAPENTIN 300 MG PO CAPS
600.0000 mg | ORAL_CAPSULE | Freq: Three times a day (TID) | ORAL | Status: DC
  Administered 2023-11-24 – 2023-11-26 (×7): 600 mg via ORAL
  Filled 2023-11-24 (×3): qty 2
  Filled 2023-11-24: qty 84
  Filled 2023-11-24 (×2): qty 2
  Filled 2023-11-24: qty 84
  Filled 2023-11-24 (×2): qty 2

## 2023-11-24 MED ORDER — GABAPENTIN 300 MG PO CAPS
300.0000 mg | ORAL_CAPSULE | ORAL | Status: AC
  Administered 2023-11-24: 300 mg via ORAL
  Filled 2023-11-24: qty 1

## 2023-11-24 MED ORDER — DULOXETINE HCL 30 MG PO CPEP
30.0000 mg | ORAL_CAPSULE | Freq: Every day | ORAL | Status: DC
Start: 2023-11-24 — End: 2023-11-27
  Administered 2023-11-24 – 2023-11-26 (×3): 30 mg via ORAL
  Filled 2023-11-24: qty 14
  Filled 2023-11-24: qty 1
  Filled 2023-11-24: qty 14
  Filled 2023-11-24 (×2): qty 1

## 2023-11-24 MED ORDER — NICOTINE 7 MG/24HR TD PT24
7.0000 mg | MEDICATED_PATCH | Freq: Every day | TRANSDERMAL | Status: DC
  Administered 2023-11-24: 7 mg via TRANSDERMAL
  Filled 2023-11-24: qty 14
  Filled 2023-11-24 (×2): qty 1
  Filled 2023-11-24: qty 14

## 2023-11-24 MED ORDER — GABAPENTIN 300 MG PO CAPS
300.0000 mg | ORAL_CAPSULE | Freq: Three times a day (TID) | ORAL | Status: DC
  Administered 2023-11-24 (×2): 300 mg via ORAL
  Filled 2023-11-24: qty 42
  Filled 2023-11-24 (×2): qty 1

## 2023-11-24 MED ORDER — FERROUS SULFATE 325 (65 FE) MG PO TABS
325.0000 mg | ORAL_TABLET | Freq: Every day | ORAL | Status: DC
  Filled 2023-11-24: qty 14

## 2023-11-24 NOTE — ED Notes (Signed)
 Patient A&Ox4. Reports thoughts to harm self when asked, denies plan. Provider notified. Denies intent/thoughts to harm others. Denies A/VH. Reports constant 9/10 aching pain to Right leg. No acute distress noted. Support and encouragement provided. Routine safety checks conducted according to facility protocol. Encouraged patient to notify staff if thoughts of harm toward self or others arise. Endorses safety. Patient verbalized understanding and agreement. Will continue to monitor for safety.

## 2023-11-24 NOTE — ED Notes (Signed)
 Provider notified of elevated blood pressure. Medication ordered and administered.

## 2023-11-24 NOTE — ED Provider Notes (Signed)
 I reassessed patient this evening who stated gabapentin was taking the edge off of his pain.  However, after ambulating to the dining room and back to his room, he did report increased pain.  He declined emergency room evaluation.  Will increase his gabapentin to 600 mg 3 times daily and continue to reassess.

## 2023-11-24 NOTE — Group Note (Signed)
 Group Topic: Relapse and Recovery  Group Date: 11/24/2023 Start Time: 1430 End Time: 1530 Facilitators: Gardiner Barefoot, RN  Department: [facility]  Number of Participants: 10  Group Focus: anxiety, chemical dependency education, and chemical dependency issues Treatment Modality:  Patient-Centered Therapy Interventions utilized were confrontation, exploration, patient education, and problem solving Purpose: enhance coping skills, explore maladaptive thinking, express feelings, express irrational fears, improve communication skills, increase insight, regain self-worth, reinforce self-care, relapse prevention strategies, and trigger / craving management  Name: Mark Hammond Date of Birth: Jun 29, 1991  MR: [id number]    Level of Participation: Did not attend Quality of Participation:  Interactions with others:  Mood/Affect:  Triggers (if applicable):  Cognition:  Progress:  Response: Plan:   Patients Problems:  Patient Active Problem List   Diagnosis Date Noted   Polysubstance abuse (HCC) 11/21/2023   Alcohol use disorder 08/04/2023   Stimulant use disorder 08/04/2023   Opioid use disorder 08/04/2023   Tobacco use disorder 08/04/2023   Cannabis use disorder 08/04/2023   Hepatitis C 03/01/2023   Anxiety state 01/14/2023   Insomnia 01/14/2023   Schizoaffective disorder (HCC) 01/13/2023   Schizophrenia (HCC) 11/15/2019   Polysubstance (including opioids) dependence, daily use (HCC)    Substance induced mood disorder (HCC) 10/24/2019   Methamphetamine use disorder, 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

## 2023-11-24 NOTE — [facility] Group Notes (Signed)
SPIRITUALITY GROUP NOTE  Pt attended spirituality group facilitated by Chaplain Kamden Stanislaw, MDIv, [facility].  Group Description:  Group focused on topic of hope.  Patients participated in facilitated discussion around topic, connecting with one another around experiences and definitions for hope.  Group members engaged with visual explorer photos, reflecting on what hope looks like for them today.  Group engaged in discussion around how their definitions of hope are present today in hospital.   Modalities: Psycho-social ed, Adlerian, Narrative, MI Patient Progress:  DID NOT ATTEND 

## 2023-11-24 NOTE — ED Provider Notes (Addendum)
 Behavioral Health Progress Note  Date and Time: 11/24/2023 4:25 PM Name: Mark Hammond MRN:  [id number]  Subjective: "I am feeling suicidal because of my pain"   Mark Hammond, 33 y.o., male patient seen face to face by this provider on 11/24/23.   HPI: Mark Hammond, 33 y/o male, with a history of MDD, schizoaffective disorder, polysubstance abuse, alcohol intoxication, intentional overdose.  Presented to [facility] voluntarily.  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  According to the patient he consume 3, 40 ounce beer and other liquor.  Patient reports he also used 2-1/2 g of cocaine has been using daily.  Patient stated he is unemployed, lives with his girlfriend.  Patient stated he was last hospitalized at [facility] for alcohol abuse and cocaine.  A review of patient records show multiple visits to different locations for the same issues.   Assessment: On evaluation today, the patient was observed lying in bed.  He makes limited eye contact and is irritable.  Patient rates his depression 7/10 and anxiety 8/10 with 10 being the worst.  He states that he continues to have withdrawal symptoms of alternating cold and hot sweats.  He denies any cravings for substances other than requesting a NicoDerm patch.  He endorses smoking 3 cigarettes a day.  He reports that he has been able to eat and sleep "a little".  Patient describes his pain started in his lower back and radiating towards his left testicle.  He denies any changes in appearance or size to his left testicle.  I have reviewed his trying completed in the emergency department on 11/20/2023 which was within normal limits.  Patient states it feels like "nerve pain" he is open to a trial Cymbalta and gabapentin for mood disorder, mood stabilization pain relief.  In reviewing his labs, patient is noted to have glucose and mild normocytic anemia.  Previously had lipids that were within acceptable range.  As  patient is on Zyprexa, he is agreeable to hemoglobin A1c, as well as repeat CMP due to AST/ALT abnormality, along with an iron profile and B12 level as anemia. The patient reports no side effects from current medications.  His suicidal thoughts are passive, and he specifically denies any active HI, or AVH.  And does not appear to be responding to internal stimuli.  He is able to contract for safety while at the facility based crisis unit.  Current symptoms being addressed include: mood instability. The patient is currently on agitation protocol for aggression/psychosis/mania.    Diagnosis:  Final diagnoses:  Alcohol abuse  Polysubstance abuse (HCC)  Schizoaffective disorder, bipolar type (HCC)  H/O medication noncompliance   Total Time Spent in Direct Patient Care:  I personally spent 35 minutes on the unit in direct patient care. The direct patient care time included face-to-face time with the patient, reviewing the patient's chart, communicating with other professionals, and coordinating care. Greater than 50% of this time was spent in counseling or coordinating care with the patient regarding goals of hospitalization, psycho-education, and discharge planning needs.  Past Psychiatric History: Pt reports a history of MDD, Bipolar Disorder & schizophrenia Past Medical History: Denies significant medical history. Family Psychiatric  History: None reported Social History: Patient resides with his girlfriend and both are currently unemployed.   Sleep: Fair  Appetite:  Fair  Current Medications:  Current Facility-Administered Medications  Medication Dose Route Frequency Provider Last Rate Last Admin   acetaminophen (TYLENOL) tablet  650 mg  650 mg Oral Q6H PRN Sindy Guadeloupe, NP   650 mg at 11/24/23 0920   alum & mag hydroxide-simeth (MAALOX/MYLANTA) 200-200-20 MG/5ML suspension 30 mL  30 mL Oral Q4H PRN Sindy Guadeloupe, NP       chlordiazePOXIDE (LIBRIUM) capsule 25 mg  25 mg Oral Q6H PRN  Maryagnes Amos, FNP       chlordiazePOXIDE (LIBRIUM) capsule 25 mg  25 mg Oral BH-qamhs Maryagnes Amos, FNP   25 mg at 11/24/23 0920   Followed by   Melene Muller ON 11/25/2023] chlordiazePOXIDE (LIBRIUM) capsule 25 mg  25 mg Oral Daily Starkes-Perry, Juel Burrow, FNP       dicyclomine (BENTYL) tablet 20 mg  20 mg Oral Q6H PRN Sindy Guadeloupe, NP   20 mg at 11/22/23 1507   DULoxetine (CYMBALTA) DR capsule 30 mg  30 mg Oral Daily Mariel Craft, MD   30 mg at 11/24/23 1135   gabapentin (NEURONTIN) capsule 300 mg  300 mg Oral TID Mariel Craft, MD   300 mg at 11/24/23 1135   hydrOXYzine (ATARAX) tablet 25 mg  25 mg Oral Q6H PRN Sindy Guadeloupe, NP   25 mg at 11/23/23 0932   loperamide (IMODIUM) capsule 2-4 mg  2-4 mg Oral PRN Sindy Guadeloupe, NP       magnesium hydroxide (MILK OF MAGNESIA) suspension 30 mL  30 mL Oral Daily PRN Sindy Guadeloupe, NP       methocarbamol (ROBAXIN) tablet 500 mg  500 mg Oral Q8H PRN Sindy Guadeloupe, NP   500 mg at 11/23/23 1939   multivitamin with minerals tablet 1 tablet  1 tablet Oral Daily Maryagnes Amos, FNP   1 tablet at 11/24/23 0920   naproxen (NAPROSYN) tablet 500 mg  500 mg Oral BID PRN Sindy Guadeloupe, NP   500 mg at 11/24/23 0920   nicotine (NICODERM CQ - dosed in mg/24 hr) patch 7 mg  7 mg Transdermal Daily Mariel Craft, MD   7 mg at 11/24/23 1136   OLANZapine (ZYPREXA) injection 10 mg  10 mg Intramuscular TID PRN Sindy Guadeloupe, NP       OLANZapine (ZYPREXA) injection 5 mg  5 mg Intramuscular TID PRN Sindy Guadeloupe, NP       OLANZapine zydis (ZYPREXA) disintegrating tablet 5 mg  5 mg Oral TID PRN Sindy Guadeloupe, NP       ondansetron (ZOFRAN-ODT) disintegrating tablet 4 mg  4 mg Oral Q6H PRN Sindy Guadeloupe, NP       thiamine (VITAMIN B1) injection 100 mg  100 mg Intramuscular Once Maryagnes Amos, FNP       traZODone (DESYREL) tablet 50 mg  50 mg Oral QHS PRN Onuoha, Chinwendu V, NP   50 mg at 11/23/23 2134   Current Outpatient Medications   Medication Sig Dispense Refill   naloxone (NARCAN) nasal spray 4 mg/0.1 mL Place 1 spray into the nose once as needed (for an opioid crisis).     OVER THE COUNTER MEDICATION Take 1,000 mg by mouth every 6 (six) hours as needed (For pain). Tylenol Extra Strength Rapid Release Gels (Acetaminophen 500mg )     ADVIL 200 MG CAPS Take 1-2 capsules (200-400 mg total) by mouth every 6 (six) hours as needed (for pain or headaches). (Patient not taking: Reported on 11/22/2023) 40 capsule 0   methocarbamol (ROBAXIN) 500 MG tablet Take 1 tablet (500 mg total) by mouth every 8 (eight) hours as needed. (Patient not taking: Reported on  11/22/2023) 8 tablet 0    Labs  Lab Results:  Admission on 11/21/2023  Component Date Value Ref Range Status   WBC 11/21/2023 5.6  4.0 - 10.5 K/uL Final   RBC 11/21/2023 4.14 (L)  4.22 - 5.81 MIL/uL Final   Hemoglobin 11/21/2023 13.1  13.0 - 17.0 g/dL Final   HCT 96/01/5408 38.8 (L)  39.0 - 52.0 % Final   MCV 11/21/2023 93.7  80.0 - 100.0 fL Final   MCH 11/21/2023 31.6  26.0 - 34.0 pg Final   MCHC 11/21/2023 33.8  30.0 - 36.0 g/dL Final   RDW 81/19/1478 13.2  11.5 - 15.5 % Final   Platelets 11/21/2023 298  150 - 400 K/uL Final   nRBC 11/21/2023 0.0  0.0 - 0.2 % Final   Neutrophils Relative % 11/21/2023 83  % Final   Neutro Abs 11/21/2023 4.6  1.7 - 7.7 K/uL Final   Lymphocytes Relative 11/21/2023 14  % Final   Lymphs Abs 11/21/2023 0.8  0.7 - 4.0 K/uL Final   Monocytes Relative 11/21/2023 3  % Final   Monocytes Absolute 11/21/2023 0.2  0.1 - 1.0 K/uL Final   Eosinophils Relative 11/21/2023 0  % Final   Eosinophils Absolute 11/21/2023 0.0  0.0 - 0.5 K/uL Final   Basophils Relative 11/21/2023 0  % Final   Basophils Absolute 11/21/2023 0.0  0.0 - 0.1 K/uL Final   Immature Granulocytes 11/21/2023 0  % Final   Abs Immature Granulocytes 11/21/2023 0.01  0.00 - 0.07 K/uL Final   Performed at [facility], [address] [place], [place] [address]   Sodium 11/21/2023  135  135 - 145 mmol/L Final   Potassium 11/21/2023 4.2  3.5 - 5.1 mmol/L Final   Chloride 11/21/2023 99  98 - 111 mmol/L Final   CO2 11/21/2023 26  22 - 32 mmol/L Final   Glucose, Bld 11/21/2023 162 (H)  70 - 99 mg/dL Final   Glucose reference range applies only to samples taken after fasting for at least 8 hours.   BUN 11/21/2023 7  6 - 20 mg/dL Final   Creatinine, Ser 11/21/2023 0.63  0.61 - 1.24 mg/dL Final   Calcium 13/05/6577 8.9  8.9 - 10.3 mg/dL Final   Total Protein 46/96/2952 8.1  6.5 - 8.1 g/dL Final   Albumin 84/13/2440 2.8 (L)  3.5 - 5.0 g/dL Final   AST 07/30/2535 91 (H)  15 - 41 U/L Final   ALT 11/21/2023 55 (H)  0 - 44 U/L Final   Alkaline Phosphatase 11/21/2023 87  38 - 126 U/L Final   Total Bilirubin 11/21/2023 0.8  0.0 - 1.2 mg/dL Final   GFR, Estimated 11/21/2023 >60  >60 mL/min Final   Comment: (NOTE) Calculated using the CKD-EPI Creatinine Equation (2021)    Anion gap 11/21/2023 10  5 - 15 Final   Performed at [facility], [address], [place], [place] [address]   Alcohol, Ethyl (B) 11/21/2023 <10  <10 mg/dL Final   Comment: (NOTE) Lowest detectable limit for serum alcohol is 10 mg/dL.  For medical purposes only. Performed at [facility], [address] [place], [place] [address]    TSH 11/21/2023 0.365  0.350 - 4.500 uIU/mL Final   Comment: Performed by a 3rd Generation assay with a functional sensitivity of <=0.01 uIU/mL. Performed at [facility], [address] [place], [place] [address]    POC Amphetamine UR 11/22/2023 None Detected  NONE  DETECTED (Cut Off Level 1000 ng/mL) Final   POC Secobarbital (BAR) 11/22/2023 None Detected  NONE DETECTED (Cut Off Level 300 ng/mL) Final   POC Buprenorphine (BUP) 11/22/2023 None Detected  NONE DETECTED (Cut Off Level 10 ng/mL) Final   POC Oxazepam (BZO) 11/22/2023 Positive (A)  NONE DETECTED (Cut Off Level 300 ng/mL) Final   POC Cocaine UR 11/22/2023 Positive (A)  NONE DETECTED (Cut Off Level  300 ng/mL) Final   POC Methamphetamine UR 11/22/2023 None Detected  NONE DETECTED (Cut Off Level 1000 ng/mL) Final   POC Morphine 11/22/2023 None Detected  NONE DETECTED (Cut Off Level 300 ng/mL) Final   POC Methadone UR 11/22/2023 None Detected  NONE DETECTED (Cut Off Level 300 ng/mL) Final   POC Oxycodone UR 11/22/2023 None Detected  NONE DETECTED (Cut Off Level 100 ng/mL) Final   POC Marijuana UR 11/22/2023 Positive (A)  NONE DETECTED (Cut Off Level 50 ng/mL) Final  Admission on 11/19/2023, Discharged on 11/20/2023  Component Date Value Ref Range Status   Sodium 11/19/2023 138  135 - 145 mmol/L Final   Potassium 11/19/2023 3.4 (L)  3.5 - 5.1 mmol/L Final   Chloride 11/19/2023 103  98 - 111 mmol/L Final   CO2 11/19/2023 26  22 - 32 mmol/L Final   Glucose, Bld 11/19/2023 123 (H)  70 - 99 mg/dL Final   Glucose reference range applies only to samples taken after fasting for at least 8 hours.   BUN 11/19/2023 <5 (L)  6 - 20 mg/dL Final   Creatinine, Ser 11/19/2023 0.50 (L)  0.61 - 1.24 mg/dL Final   Calcium 82/95/6213 8.2 (L)  8.9 - 10.3 mg/dL Final   GFR, Estimated 11/19/2023 >60  >60 mL/min Final   Comment: (NOTE) Calculated using the CKD-EPI Creatinine Equation (2021)    Anion gap 11/19/2023 9  5 - 15 Final   Performed at [facility], [address] [place], [place] [address]   WBC 11/19/2023 8.4  4.0 - 10.5 K/uL Final   RBC 11/19/2023 4.00 (L)  4.22 - 5.81 MIL/uL Final   Hemoglobin 11/19/2023 12.6 (L)  13.0 - 17.0 g/dL Final   HCT 84/69/6295 37.7 (L)  39.0 - 52.0 % Final   MCV 11/19/2023 94.3  80.0 - 100.0 fL Final   MCH 11/19/2023 31.5  26.0 - 34.0 pg Final   MCHC 11/19/2023 33.4  30.0 - 36.0 g/dL Final   RDW 28/41/3244 13.2  11.5 - 15.5 % Final   Platelets 11/19/2023 267  150 - 400 K/uL Final   nRBC 11/19/2023 0.0  0.0 - 0.2 % Final   Neutrophils Relative % 11/19/2023 65  % Final   Neutro Abs 11/19/2023 5.5  1.7 - 7.7 K/uL Final   Lymphocytes Relative  11/19/2023 22  % Final   Lymphs Abs 11/19/2023 1.9  0.7 - 4.0 K/uL Final   Monocytes Relative 11/19/2023 11  % Final   Monocytes Absolute 11/19/2023 0.9  0.1 - 1.0 K/uL Final   Eosinophils Relative 11/19/2023 1  % Final   Eosinophils Absolute 11/19/2023 0.1  0.0 - 0.5 K/uL Final   Basophils Relative 11/19/2023 1  % Final   Basophils Absolute 11/19/2023 0.1  0.0 - 0.1 K/uL Final   Immature Granulocytes 11/19/2023 0  % Final   Abs Immature Granulocytes 11/19/2023 0.03  0.00 - 0.07 K/uL Final   Performed at [facility], [address], [place], [place] [address]   Sed Rate 11/19/2023 77 (H)  0 -  16 mm/hr Final   Performed at [facility], [address], [place], [place] [address]   CRP 11/19/2023 4.2 (H)  <1.0 mg/dL Final   Performed at [facility], [address], [place], [place] [address]   Sodium 11/19/2023 141  135 - 145 mmol/L Final   Potassium 11/19/2023 3.5  3.5 - 5.1 mmol/L Final   Chloride 11/19/2023 101  98 - 111 mmol/L Final   BUN 11/19/2023 <3 (L)  6 - 20 mg/dL Final   Creatinine, Ser 11/19/2023 0.70  0.61 - 1.24 mg/dL Final   Glucose, Bld 29/56/2130 126 (H)  70 - 99 mg/dL Final   Glucose reference range applies only to samples taken after fasting for at least 8 hours.   Calcium, Ion 11/19/2023 1.05 (L)  1.15 - 1.40 mmol/L Final   TCO2 11/19/2023 27  22 - 32 mmol/L Final   Hemoglobin 11/19/2023 12.9 (L)  13.0 - 17.0 g/dL Final   HCT 86/57/8469 38.0 (L)  39.0 - 52.0 % Final   SARS Coronavirus 2 by RT PCR 11/19/2023 NEGATIVE  NEGATIVE Final   Comment: (NOTE) SARS-CoV-2 target nucleic acids are NOT DETECTED.  The SARS-CoV-2 RNA is generally detectable in upper respiratory specimens during the acute phase of infection. The lowest concentration of SARS-CoV-2 viral copies this assay can detect is 138 copies/mL. A negative result does not preclude SARS-Cov-2 infection and should not be used as the sole basis for treatment or other  patient management decisions. A negative result may occur with  improper specimen collection/handling, submission of specimen other than nasopharyngeal swab, presence of viral mutation(s) within the areas targeted by this assay, and inadequate number of viral copies(<138 copies/mL). A negative result must be combined with clinical observations, patient history, and epidemiological information. The expected result is Negative.  Fact Sheet for Patients:  BloggerCourse.com  Fact Sheet for Healthcare Providers:  SeriousBroker.it  This test is no                          t yet approved or cleared by the [place] FDA and  has been authorized for detection and/or diagnosis of SARS-CoV-2 by FDA under an Emergency Use Authorization (EUA). This EUA will remain  in effect (meaning this test can be used) for the duration of the COVID-19 declaration under Section 564(b)(1) of the Act, 21 U.S.C.section 360bbb-3(b)(1), unless the authorization is terminated  or revoked sooner.       Influenza A by PCR 11/19/2023 NEGATIVE  NEGATIVE Final   Influenza B by PCR 11/19/2023 NEGATIVE  NEGATIVE Final   Comment: (NOTE) The Xpert Xpress SARS-CoV-2/FLU/RSV plus assay is intended as an aid in the diagnosis of influenza from Nasopharyngeal swab specimens and should not be used as a sole basis for treatment. Nasal washings and aspirates are unacceptable for Xpert Xpress SARS-CoV-2/FLU/RSV testing.  Fact Sheet for Patients: BloggerCourse.com  Fact Sheet for Healthcare Providers: SeriousBroker.it  This test is not yet approved or cleared by the [place] FDA and has been authorized for detection and/or diagnosis of SARS-CoV-2 by FDA under an Emergency Use Authorization (EUA). This EUA will remain in effect (meaning this test can be used) for the duration of the COVID-19 declaration under Section  564(b)(1) of the Act, 21 U.S.C. section 360bbb-3(b)(1), unless the authorization is terminated or revoked.     Resp Syncytial Virus by PCR 11/19/2023 NEGATIVE  NEGATIVE Final   Comment: (NOTE) Fact Sheet for Patients:  BloggerCourse.com  Fact Sheet for Healthcare Providers: SeriousBroker.it  This test is not yet approved or cleared by the [place] FDA and has been authorized for detection and/or diagnosis of SARS-CoV-2 by FDA under an Emergency Use Authorization (EUA). This EUA will remain in effect (meaning this test can be used) for the duration of the COVID-19 declaration under Section 564(b)(1) of the Act, 21 U.S.C. section 360bbb-3(b)(1), unless the authorization is terminated or revoked.  Performed at [facility], [address], [place], [place] [address]   Admission on 11/09/2023, Discharged on 11/09/2023  Component Date Value Ref Range Status   Color, Urine 11/09/2023 AMBER (A)  YELLOW Final   BIOCHEMICALS MAY BE AFFECTED BY COLOR   APPearance 11/09/2023 CLEAR  CLEAR Final   Specific Gravity, Urine 11/09/2023 1.036 (H)  1.005 - 1.030 Final   pH 11/09/2023 5.0  5.0 - 8.0 Final   Glucose, UA 11/09/2023 NEGATIVE  NEGATIVE mg/dL Final   Hgb urine dipstick 11/09/2023 NEGATIVE  NEGATIVE Final   Bilirubin Urine 11/09/2023 SMALL (A)  NEGATIVE Final   Ketones, ur 11/09/2023 NEGATIVE  NEGATIVE mg/dL Final   Protein, ur 29/56/2130 30 (A)  NEGATIVE mg/dL Final   Nitrite 86/57/8469 NEGATIVE  NEGATIVE Final   Leukocytes,Ua 11/09/2023 NEGATIVE  NEGATIVE Final   RBC / HPF 11/09/2023 0-5  0 - 5 RBC/hpf Final   WBC, UA 11/09/2023 0-5  0 - 5 WBC/hpf Final   Bacteria, UA 11/09/2023 NONE SEEN  NONE SEEN Final   Squamous Epithelial / HPF 11/09/2023 0-5  0 - 5 /HPF Final   Mucus 11/09/2023 PRESENT   Final   Performed at [facility], [address] [place], [place] [address]   Opiates 11/09/2023  POSITIVE (A)  NONE DETECTED Final   Cocaine 11/09/2023 POSITIVE (A)  NONE DETECTED Final   Benzodiazepines 11/09/2023 POSITIVE (A)  NONE DETECTED Final   Amphetamines 11/09/2023 NONE DETECTED  NONE DETECTED Final   Tetrahydrocannabinol 11/09/2023 POSITIVE (A)  NONE DETECTED Final   Barbiturates 11/09/2023 NONE DETECTED  NONE DETECTED Final   Comment: (NOTE) DRUG SCREEN FOR MEDICAL PURPOSES ONLY.  IF CONFIRMATION IS NEEDED FOR ANY PURPOSE, NOTIFY LAB WITHIN 5 DAYS.  LOWEST DETECTABLE LIMITS FOR URINE DRUG SCREEN Drug Class                     Cutoff (ng/mL) Amphetamine and metabolites    1000 Barbiturate and metabolites    200 Benzodiazepine                 200 Opiates and metabolites        300 Cocaine and metabolites        300 THC                            50 Performed at [facility], [address] [place], [place] [address]   Admission on 08/10/2023, Discharged on 08/14/2023  Component Date Value Ref Range Status   Sodium 08/10/2023 137  135 - 145 mmol/L Final   Potassium 08/10/2023 3.4 (L)  3.5 - 5.1 mmol/L Final   Chloride 08/10/2023 100  98 - 111 mmol/L Final   CO2 08/10/2023 25  22 - 32 mmol/L Final   Glucose, Bld 08/10/2023 109 (H)  70 - 99 mg/dL Final   Glucose reference range applies only to samples taken after fasting for at least 8 hours.   BUN 08/10/2023  11  6 - 20 mg/dL Final   Creatinine, Ser 08/10/2023 0.71  0.61 - 1.24 mg/dL Final   Calcium 16/07/9603 8.8 (L)  8.9 - 10.3 mg/dL Final   Total Protein 54/06/8118 7.9  6.5 - 8.1 g/dL Final   Albumin 14/78/2956 3.8  3.5 - 5.0 g/dL Final   AST 21/30/8657 466 (H)  15 - 41 U/L Final   ALT 08/10/2023 264 (H)  0 - 44 U/L Final   Alkaline Phosphatase 08/10/2023 112  38 - 126 U/L Final   Total Bilirubin 08/10/2023 1.2 (H)  <1.2 mg/dL Final   GFR, Estimated 08/10/2023 >60  >60 mL/min Final   Comment: (NOTE) Calculated using the CKD-EPI Creatinine Equation (2021)    Anion gap 08/10/2023 12  5 - 15  Final   Performed at [facility], [address] [place], [place] [address]   Alcohol, Ethyl (B) 08/10/2023 144 (H)  <10 mg/dL Final   Comment: (NOTE) Lowest detectable limit for serum alcohol is 10 mg/dL.  For medical purposes only. Performed at [facility], [address], [place], [place] [address]    Salicylate Lvl 08/10/2023 <7.0 (L)  7.0 - 30.0 mg/dL Final   Performed at [facility], [address] [place], [place] [address]   Acetaminophen (Tylenol), Serum 08/10/2023 <10 (L)  10 - 30 ug/mL Final   Comment: (NOTE) Therapeutic concentrations vary significantly. A range of 10-30 ug/mL  may be an effective concentration for many patients. However, some  are best treated at concentrations outside of this range. Acetaminophen concentrations >150 ug/mL at 4 hours after ingestion  and >50 ug/mL at 12 hours after ingestion are often associated with  toxic reactions.  Performed at [facility], [address] [place], [place] [address]    WBC 08/10/2023 8.0  4.0 - 10.5 K/uL Final   RBC 08/10/2023 4.13 (L)  4.22 - 5.81 MIL/uL Final   Hemoglobin 08/10/2023 13.8  13.0 - 17.0 g/dL Final   HCT 72/53/6644 39.4  39.0 - 52.0 % Final   MCV 08/10/2023 95.4  80.0 - 100.0 fL Final   MCH 08/10/2023 33.4  26.0 - 34.0 pg Final   MCHC 08/10/2023 35.0  30.0 - 36.0 g/dL Final   RDW 03/47/4259 13.1  11.5 - 15.5 % Final   Platelets 08/10/2023 226  150 - 400 K/uL Final   nRBC 08/10/2023 0.0  0.0 - 0.2 % Final   Performed at [facility], [address] [place], [place] [address]   Opiates 08/10/2023 NONE DETECTED  NONE DETECTED Final   Cocaine 08/10/2023 POSITIVE (A)  NONE DETECTED Final   Benzodiazepines 08/10/2023 NONE DETECTED  NONE DETECTED Final   Amphetamines 08/10/2023 POSITIVE (A)  NONE DETECTED Final   Tetrahydrocannabinol 08/10/2023 POSITIVE (A)  NONE DETECTED Final   Barbiturates  08/10/2023 NONE DETECTED  NONE DETECTED Final   Comment: (NOTE) DRUG SCREEN FOR MEDICAL PURPOSES ONLY.  IF CONFIRMATION IS NEEDED FOR ANY PURPOSE, NOTIFY LAB WITHIN 5 DAYS.  LOWEST DETECTABLE LIMITS FOR URINE DRUG SCREEN Drug Class                     Cutoff (ng/mL) Amphetamine and metabolites    1000 Barbiturate and metabolites    200 Benzodiazepine                 200 Opiates and metabolites        300 Cocaine and metabolites  300 THC                            50 Performed at [facility], [address] [place], [place] [address]    Total CK 08/10/2023 461 (H)  49 - 397 U/L Final   Performed at [facility], [address], [place], [place] [address]   Hepatitis B Surface Ag 08/11/2023 NON REACTIVE  NON REACTIVE Final   HCV Ab 08/11/2023 Reactive (A)  NON REACTIVE Final   Comment: (NOTE) The CDC recommends that a Reactive HCV antibody result be followed up  with a HCV Nucleic Acid Amplification test.     Hep A IgM 08/11/2023 NON REACTIVE  NON REACTIVE Final   Hep B C IgM 08/11/2023 NON REACTIVE  NON REACTIVE Final   Performed at [facility], [address], [place], [place] [address]   Total Protein 08/12/2023 7.4  6.5 - 8.1 g/dL Final   Albumin 91/47/8295 3.2 (L)  3.5 - 5.0 g/dL Final   AST 62/13/0865 381 (H)  15 - 41 U/L Final   ALT 08/12/2023 247 (H)  0 - 44 U/L Final   Alkaline Phosphatase 08/12/2023 97  38 - 126 U/L Final   Total Bilirubin 08/12/2023 1.0  <1.2 mg/dL Final   Bilirubin, Direct 08/12/2023 0.2  0.0 - 0.2 mg/dL Final   Indirect Bilirubin 08/12/2023 0.8  0.3 - 0.9 mg/dL Final   Performed at [facility], [address] [place], [place] [address]   Total CK 08/12/2023 41 (L)  49 - 397 U/L Final   Performed at [facility], [address], [place], [place] [address]   Ammonia 08/12/2023 48 (H)  9 - 35 umol/L Final   Performed at [facility], [address], [place], [place] [address]  Admission on 08/04/2023, Discharged on 08/07/2023  Component Date Value Ref Range Status   Amphetamines, Urine 08/04/2023 Negative  Cutoff=1000 ng/mL Final   Amphetamine test includes Amphetamine and Methamphetamine.   Barbiturate, Ur 08/04/2023 Negative  Cutoff=300 ng/mL Final   Benzodiazepine Quant, Ur 08/04/2023 Negative  Cutoff=300 ng/mL Final   Cannabinoid Quant, Ur 08/04/2023 Negative  Cutoff=50 ng/mL Final   Cocaine (Metab.) 08/04/2023 See Final Results  Cutoff=300 ng/mL Final   Opiate Quant, Ur 08/04/2023 Negative  Cutoff=300 ng/mL Final   Opiate test includes Codeine and Morphine only.   Phencyclidine, Ur 08/04/2023 Negative  Cutoff=25 ng/mL Final   Methadone Screen, Urine 08/04/2023 Negative  Cutoff=300 ng/mL Final   Propoxyphene, Urine 08/04/2023 Negative  Cutoff=300 ng/mL Final   Ethanol UCloretta Ned 08/04/2023 See Final Results  Cutoff=0.020 % Final   Comment: (NOTE) Performed At: [facility] [address] [place], [place] [id number] Avis Epley PhD [id number]    WBC 08/04/2023 5.8  4.0 - 10.5 K/uL Final   RBC 08/04/2023 3.93 (L)  4.22 - 5.81 MIL/uL Final   Hemoglobin 08/04/2023 12.9 (L)  13.0 - 17.0 g/dL Final   HCT 95/63/8756 36.4 (L)  39.0 - 52.0 % Final   MCV 08/04/2023 92.6  80.0 - 100.0 fL Final   MCH 08/04/2023 32.8  26.0 - 34.0 pg Final   MCHC 08/04/2023 35.4  30.0 - 36.0 g/dL Final   RDW 43/32/9518 12.7  11.5 - 15.5 % Final   Platelets 08/04/2023 147 (L)  150 - 400 K/uL Final   Comment: SPECIMEN CHECKED FOR CLOTS REPEATED  TO VERIFY PLATELET COUNT CONFIRMED BY SMEAR    nRBC 08/04/2023 0.0  0.0 - 0.2 % Final   Neutrophils Relative % 08/04/2023 76  % Final   Neutro Abs 08/04/2023 4.4  1.7 - 7.7 K/uL Final   Lymphocytes Relative 08/04/2023 16  % Final   Lymphs Abs 08/04/2023 0.9  0.7 - 4.0 K/uL Final   Monocytes Relative 08/04/2023 6  % Final   Monocytes Absolute 08/04/2023 0.3  0.1 - 1.0 K/uL Final   Eosinophils  Relative 08/04/2023 0  % Final   Eosinophils Absolute 08/04/2023 0.0  0.0 - 0.5 K/uL Final   Basophils Relative 08/04/2023 1  % Final   Basophils Absolute 08/04/2023 0.0  0.0 - 0.1 K/uL Final   Immature Granulocytes 08/04/2023 1  % Final   Abs Immature Granulocytes 08/04/2023 0.04  0.00 - 0.07 K/uL Final   Performed at [facility], [address], [place], [place] [address]   Sodium 08/04/2023 134 (L)  135 - 145 mmol/L Final   Potassium 08/04/2023 3.0 (L)  3.5 - 5.1 mmol/L Final   Chloride 08/04/2023 95 (L)  98 - 111 mmol/L Final   CO2 08/04/2023 24  22 - 32 mmol/L Final   Glucose, Bld 08/04/2023 115 (H)  70 - 99 mg/dL Final   Glucose reference range applies only to samples taken after fasting for at least 8 hours.   BUN 08/04/2023 5 (L)  6 - 20 mg/dL Final   Creatinine, Ser 08/04/2023 0.56 (L)  0.61 - 1.24 mg/dL Final   Calcium 60/45/4098 8.2 (L)  8.9 - 10.3 mg/dL Final   Total Protein 11/91/4782 7.7  6.5 - 8.1 g/dL Final   Albumin 95/62/1308 3.6  3.5 - 5.0 g/dL Final   AST 65/78/4696 205 (H)  15 - 41 U/L Final   ALT 08/04/2023 99 (H)  0 - 44 U/L Final   Alkaline Phosphatase 08/04/2023 98  38 - 126 U/L Final   Total Bilirubin 08/04/2023 1.0  0.3 - 1.2 mg/dL Final   GFR, Estimated 08/04/2023 >60  >60 mL/min Final   Comment: (NOTE) Calculated using the CKD-EPI Creatinine Equation (2021)    Anion gap 08/04/2023 15  5 - 15 Final   Performed at [facility], [address] [place], [place] [address]   Hgb A1c MFr Bld 08/04/2023 5.4  4.8 - 5.6 % Final   Comment: (NOTE) Pre diabetes:          5.7%-6.4%  Diabetes:              >6.4%  Glycemic control for   <7.0% adults with diabetes    Mean Plasma Glucose 08/04/2023 108.28  mg/dL Final   Performed at [facility], [address], [place], [place] [address]   TSH 08/04/2023 0.613  0.350 - 4.500 uIU/mL Final   Comment: Performed by a 3rd Generation assay with a functional sensitivity of <=0.01 uIU/mL. Performed at  [facility], [address], [place], [place] [address]    Neisseria Gonorrhea 08/04/2023 Negative   Final   Chlamydia 08/04/2023 Negative   Final   Comment 08/04/2023 Normal Reference Ranger Chlamydia - Negative   Final   Comment 08/04/2023 Normal Reference Range Neisseria Gonorrhea - Negative   Final   RPR Ser Ql 08/04/2023 NON REACTIVE  NON REACTIVE Final   Performed at [facility], [address], [place], [place] [address]   HIV-1 P24 Antigen - HIV24 08/04/2023 NON  REACTIVE  NON REACTIVE Final   Comment: (NOTE) Detection of p24 may be inhibited by biotin in the sample, causing false negative results in acute infection.    HIV 1/2 Antibodies 08/04/2023 NON REACTIVE  NON REACTIVE Final   Interpretation (HIV Ag Ab) 08/04/2023 A non reactive test result means that HIV 1 or HIV 2 antibodies and HIV 1 p24 antigen were not detected in the specimen.   Final   Performed at [facility], [address] [place], [place] [address]   Hepatitis B Surface Ag 08/04/2023 NON REACTIVE  NON REACTIVE Final   HCV Ab 08/04/2023 Reactive (A)  NON REACTIVE Final   Comment: (NOTE) The CDC recommends that a Reactive HCV antibody result be followed up  with a HCV Nucleic Acid Amplification test.     Hep A IgM 08/04/2023 NON REACTIVE  NON REACTIVE Final   Hep B C IgM 08/04/2023 NON REACTIVE  NON REACTIVE Final   Performed at [facility], [address], [place], [place] [address]   POC Amphetamine UR 08/04/2023 None Detected  NONE DETECTED (Cut Off Level 1000 ng/mL) Final   POC Secobarbital (BAR) 08/04/2023 None Detected  NONE DETECTED (Cut Off Level 300 ng/mL) Final   POC Buprenorphine (BUP) 08/04/2023 None Detected  NONE DETECTED (Cut Off Level 10 ng/mL) Final   POC Oxazepam (BZO) 08/04/2023 None Detected  NONE DETECTED (Cut Off Level 300 ng/mL) Final   POC Cocaine UR 08/04/2023 Positive (A)  NONE DETECTED (Cut Off Level 300 ng/mL) Final   POC Methamphetamine UR 08/04/2023 None  Detected  NONE DETECTED (Cut Off Level 1000 ng/mL) Final   POC Morphine 08/04/2023 None Detected  NONE DETECTED (Cut Off Level 300 ng/mL) Final   POC Methadone UR 08/04/2023 None Detected  NONE DETECTED (Cut Off Level 300 ng/mL) Final   POC Oxycodone UR 08/04/2023 None Detected  NONE DETECTED (Cut Off Level 100 ng/mL) Final   POC Marijuana UR 08/04/2023 Positive (A)  NONE DETECTED (Cut Off Level 50 ng/mL) Final   Cocaine Metab Quant, Ur 08/04/2023 Positive (A)  Cutoff=300 Final   Benzoylecgonine GC/MS Conf 08/04/2023 35,080  Cutoff=150 ng/mL Final   Comment: (NOTE) Performed At: [facility] [address] [place], [place] [id number] Avis Epley PhD [id number]    Ethanol, Urine 08/04/2023 FID) (A)  Cutoff=0.020 Final   Comment: Positive Confirmation performed by Gas Chromatography (GC    Ethanol, Ur - Confirmation 08/04/2023 0.099  Cutoff=0.020 % Final   Comment: (NOTE) GLUCOSE NEGATIVE Performed At: [facility] [address] [place], [place] [id number] Avis Epley PhD [id number]   Admission on 06/15/2023, Discharged on 06/16/2023  Component Date Value Ref Range Status   WBC 06/15/2023 5.8  4.0 - 10.5 K/uL Final   RBC 06/15/2023 4.61  4.22 - 5.81 MIL/uL Final   Hemoglobin 06/15/2023 15.5  13.0 - 17.0 g/dL Final   HCT 34/74/2595 46.0  39.0 - 52.0 % Final   MCV 06/15/2023 99.8  80.0 - 100.0 fL Final   MCH 06/15/2023 33.6  26.0 - 34.0 pg Final   MCHC 06/15/2023 33.7  30.0 - 36.0 g/dL Final   RDW 63/87/5643 13.3  11.5 - 15.5 % Final   Platelets 06/15/2023 244  150 - 400 K/uL Final   nRBC 06/15/2023 0.0  0.0 - 0.2 % Final   Neutrophils Relative % 06/15/2023 56  % Final   Neutro Abs 06/15/2023 3.3  1.7 - 7.7 K/uL Final   Lymphocytes  Relative 06/15/2023 33  % Final   Lymphs Abs 06/15/2023 1.9  0.7 - 4.0 K/uL Final   Monocytes Relative 06/15/2023 6  % Final   Monocytes Absolute 06/15/2023 0.4  0.1 - 1.0 K/uL Final   Eosinophils Relative 06/15/2023 2  % Final    Eosinophils Absolute 06/15/2023 0.1  0.0 - 0.5 K/uL Final   Basophils Relative 06/15/2023 3  % Final   Basophils Absolute 06/15/2023 0.2 (H)  0.0 - 0.1 K/uL Final   Immature Granulocytes 06/15/2023 0  % Final   Abs Immature Granulocytes 06/15/2023 0.01  0.00 - 0.07 K/uL Final   Performed at [facility], [address], [place], [place] [address]   Sodium 06/15/2023 140  135 - 145 mmol/L Final   Potassium 06/15/2023 3.8  3.5 - 5.1 mmol/L Final   Chloride 06/15/2023 103  98 - 111 mmol/L Final   CO2 06/15/2023 28  22 - 32 mmol/L Final   Glucose, Bld 06/15/2023 108 (H)  70 - 99 mg/dL Final   Glucose reference range applies only to samples taken after fasting for at least 8 hours.   BUN 06/15/2023 5 (L)  6 - 20 mg/dL Final   Creatinine, Ser 06/15/2023 0.65  0.61 - 1.24 mg/dL Final   Calcium 57/84/6962 8.3 (L)  8.9 - 10.3 mg/dL Final   Total Protein 95/28/4132 8.6 (H)  6.5 - 8.1 g/dL Final   Albumin 44/10/270 3.6  3.5 - 5.0 g/dL Final   AST 53/66/4403 296 (H)  15 - 41 U/L Final   ALT 06/15/2023 205 (H)  0 - 44 U/L Final   Alkaline Phosphatase 06/15/2023 145 (H)  38 - 126 U/L Final   Total Bilirubin 06/15/2023 0.4  0.3 - 1.2 mg/dL Final   GFR, Estimated 06/15/2023 >60  >60 mL/min Final   Comment: (NOTE) Calculated using the CKD-EPI Creatinine Equation (2021)    Anion gap 06/15/2023 9  5 - 15 Final   Performed at [facility], [address] [place], [place] [address]   Lipase 06/15/2023 27  11 - 51 U/L Final   Performed at [facility], [address] [place], [place] [address]   Specimen Source 06/16/2023 URINE, CLEAN CATCH   Final   Color, Urine 06/16/2023 YELLOW  YELLOW Final   APPearance 06/16/2023 HAZY (A)  CLEAR Final   Specific Gravity, Urine 06/16/2023 1.015  1.005 - 1.030 Final   pH 06/16/2023 6.0  5.0 - 8.0 Final   Glucose, UA 06/16/2023 NEGATIVE  NEGATIVE mg/dL Final   Hgb urine dipstick 06/16/2023 NEGATIVE  NEGATIVE  Final   Bilirubin Urine 06/16/2023 NEGATIVE  NEGATIVE Final   Ketones, ur 06/16/2023 NEGATIVE  NEGATIVE mg/dL Final   Protein, ur 75/64/3329 NEGATIVE  NEGATIVE mg/dL Final   Nitrite 51/88/4166 NEGATIVE  NEGATIVE Final   Leukocytes,Ua 06/16/2023 NEGATIVE  NEGATIVE Final   RBC / HPF 06/16/2023 0-5  0 - 5 RBC/hpf Final   WBC, UA 06/16/2023 0-5  0 - 5 WBC/hpf Final   Comment:        Reflex urine culture not performed if WBC <=10, OR if Squamous epithelial cells >5. If Squamous epithelial cells >5 suggest recollection.    Bacteria, UA 06/16/2023 NONE SEEN  NONE SEEN Final   Squamous Epithelial / HPF 06/16/2023 0-5  0 - 5 /HPF Final   Mucus 06/16/2023 PRESENT   Final   Performed at [facility], [address], [place], [place] [address]  Alcohol, Ethyl (B) 06/15/2023 288 (H)  <10 mg/dL Final   Comment: (NOTE) Lowest detectable limit for serum alcohol is 10 mg/dL.  For medical purposes only. Performed at [facility], [address] [place], [place] [address]    Salicylate Lvl 06/15/2023 <7.0 (L)  7.0 - 30.0 mg/dL Final   Performed at [facility], [address], [place], [place] [address]   Acetaminophen (Tylenol), Serum 06/15/2023 <10 (L)  10 - 30 ug/mL Final   Comment: (NOTE) Therapeutic concentrations vary significantly. A range of 10-30 ug/mL  may be an effective concentration for many patients. However, some  are best treated at concentrations outside of this range. Acetaminophen concentrations >150 ug/mL at 4 hours after ingestion  and >50 ug/mL at 12 hours after ingestion are often associated with  toxic reactions.  Performed at [facility], [address] [place], [place] [address]    Opiates 06/16/2023 NONE DETECTED  NONE DETECTED Final   Cocaine 06/16/2023 POSITIVE (A)  NONE DETECTED Final   Benzodiazepines 06/16/2023 NONE DETECTED  NONE DETECTED Final   Amphetamines 06/16/2023 NONE  DETECTED  NONE DETECTED Final   Tetrahydrocannabinol 06/16/2023 POSITIVE (A)  NONE DETECTED Final   Barbiturates 06/16/2023 NONE DETECTED  NONE DETECTED Final   Comment: (NOTE) DRUG SCREEN FOR MEDICAL PURPOSES ONLY.  IF CONFIRMATION IS NEEDED FOR ANY PURPOSE, NOTIFY LAB WITHIN 5 DAYS.  LOWEST DETECTABLE LIMITS FOR URINE DRUG SCREEN Drug Class                     Cutoff (ng/mL) Amphetamine and metabolites    1000 Barbiturate and metabolites    200 Benzodiazepine                 200 Opiates and metabolites        300 Cocaine and metabolites        300 THC                            50 Performed at [facility], [address], [place], [place] [address]   Admission on 06/09/2023, Discharged on 06/10/2023  Component Date Value Ref Range Status   Lipase 06/09/2023 38  11 - 51 U/L Final   Performed at [facility], [address], [place], [place] [address]   Sodium 06/09/2023 137  135 - 145 mmol/L Final   Potassium 06/09/2023 3.6  3.5 - 5.1 mmol/L Final   Chloride 06/09/2023 100  98 - 111 mmol/L Final   CO2 06/09/2023 25  22 - 32 mmol/L Final   Glucose, Bld 06/09/2023 140 (H)  70 - 99 mg/dL Final   Glucose reference range applies only to samples taken after fasting for at least 8 hours.   BUN 06/09/2023 14  6 - 20 mg/dL Final   Creatinine, Ser 06/09/2023 0.75  0.61 - 1.24 mg/dL Final   Calcium 34/74/2595 8.6 (L)  8.9 - 10.3 mg/dL Final   Total Protein 63/87/5643 8.4 (H)  6.5 - 8.1 g/dL Final   Albumin 32/95/1884 3.6  3.5 - 5.0 g/dL Final   AST 16/60/6301 222 (H)  15 - 41 U/L Final   ALT 06/09/2023 176 (H)  0 - 44 U/L Final   Alkaline Phosphatase 06/09/2023 183 (H)  38 - 126 U/L Final   Total Bilirubin 06/09/2023 0.7  0.3 - 1.2 mg/dL Final   GFR, Estimated 06/09/2023 >60  >  60 mL/min Final   Comment: (NOTE) Calculated using the CKD-EPI Creatinine Equation (2021)    Anion gap 06/09/2023 12  5 - 15 Final   Performed at [facility], [address] [place], [place] [address]   WBC 06/09/2023 10.3  4.0 - 10.5 K/uL Final   RBC 06/09/2023 4.30  4.22 - 5.81 MIL/uL Final   Hemoglobin 06/09/2023 14.7  13.0 - 17.0 g/dL Final   HCT 62/95/2841 42.1  39.0 - 52.0 % Final   MCV 06/09/2023 97.9  80.0 - 100.0 fL Final   MCH 06/09/2023 34.2 (H)  26.0 - 34.0 pg Final   MCHC 06/09/2023 34.9  30.0 - 36.0 g/dL Final   RDW 32/44/0102 12.8  11.5 - 15.5 % Final   Platelets 06/09/2023 315  150 - 400 K/uL Final   nRBC 06/09/2023 0.0  0.0 - 0.2 % Final   Performed at [facility], [address], [place], [place] [address]   Color, Urine 06/10/2023 YELLOW  YELLOW Final   APPearance 06/10/2023 CLEAR  CLEAR Final   Specific Gravity, Urine 06/10/2023 1.010  1.005 - 1.030 Final   pH 06/10/2023 5.0  5.0 - 8.0 Final   Glucose, UA 06/10/2023 NEGATIVE  NEGATIVE mg/dL Final   Hgb urine dipstick 06/10/2023 NEGATIVE  NEGATIVE Final   Bilirubin Urine 06/10/2023 NEGATIVE  NEGATIVE Final   Ketones, ur 06/10/2023 NEGATIVE  NEGATIVE mg/dL Final   Protein, ur 64/40/3474 NEGATIVE  NEGATIVE mg/dL Final   Nitrite 25/95/6387 NEGATIVE  NEGATIVE Final   Leukocytes,Ua 06/10/2023 NEGATIVE  NEGATIVE Final   Performed at [facility], [address] [place], [place] [address]  Admission on 06/09/2023, Discharged on 06/09/2023  Component Date Value Ref Range Status   Opiates 06/09/2023 NONE DETECTED  NONE DETECTED Final   Cocaine 06/09/2023 POSITIVE (A)  NONE DETECTED Final   Benzodiazepines 06/09/2023 NONE DETECTED  NONE DETECTED Final   Amphetamines 06/09/2023 NONE DETECTED  NONE DETECTED Final   Tetrahydrocannabinol 06/09/2023 POSITIVE (A)  NONE DETECTED Final   Barbiturates 06/09/2023 NONE DETECTED  NONE DETECTED Final   Comment: (NOTE) DRUG SCREEN FOR MEDICAL PURPOSES ONLY.  IF CONFIRMATION IS NEEDED FOR ANY PURPOSE, NOTIFY LAB WITHIN 5 DAYS.  LOWEST DETECTABLE LIMITS FOR URINE DRUG SCREEN Drug Class                      Cutoff (ng/mL) Amphetamine and metabolites    1000 Barbiturate and metabolites    200 Benzodiazepine                 200 Opiates and metabolites        300 Cocaine and metabolites        300 THC                            50 Performed at [facility], [address], [place], [place] [address]    Color, Urine 06/09/2023 YELLOW  YELLOW Final   APPearance 06/09/2023 CLEAR  CLEAR Final   Specific Gravity, Urine 06/09/2023 1.016  1.005 - 1.030 Final   pH 06/09/2023 8.0  5.0 - 8.0 Final   Glucose, UA 06/09/2023 NEGATIVE  NEGATIVE mg/dL Final   Hgb urine dipstick 06/09/2023 NEGATIVE  NEGATIVE Final   Bilirubin Urine 06/09/2023 NEGATIVE  NEGATIVE Final   Ketones, ur 06/09/2023 NEGATIVE  NEGATIVE mg/dL Final   Protein, ur 84/16/6063 NEGATIVE  NEGATIVE  mg/dL Final   Nitrite 91/47/8295 NEGATIVE  NEGATIVE Final   Leukocytes,Ua 06/09/2023 NEGATIVE  NEGATIVE Final   Performed at [facility], [address], [place], [place] [address]  Admission on 06/03/2023, Discharged on 06/05/2023  Component Date Value Ref Range Status   WBC 06/03/2023 6.4  4.0 - 10.5 K/uL Final   RBC 06/03/2023 3.88 (L)  4.22 - 5.81 MIL/uL Final   Hemoglobin 06/03/2023 13.3  13.0 - 17.0 g/dL Final   HCT 86/57/8469 38.0 (L)  39.0 - 52.0 % Final   MCV 06/03/2023 97.9  80.0 - 100.0 fL Final   MCH 06/03/2023 34.3 (H)  26.0 - 34.0 pg Final   MCHC 06/03/2023 35.0  30.0 - 36.0 g/dL Final   RDW 62/95/2841 13.0  11.5 - 15.5 % Final   Platelets 06/03/2023 233  150 - 400 K/uL Final   nRBC 06/03/2023 0.0  0.0 - 0.2 % Final   Neutrophils Relative % 06/03/2023 49  % Final   Neutro Abs 06/03/2023 3.2  1.7 - 7.7 K/uL Final   Lymphocytes Relative 06/03/2023 40  % Final   Lymphs Abs 06/03/2023 2.6  0.7 - 4.0 K/uL Final   Monocytes Relative 06/03/2023 7  % Final   Monocytes Absolute 06/03/2023 0.4  0.1 - 1.0 K/uL Final   Eosinophils Relative 06/03/2023 2  % Final   Eosinophils  Absolute 06/03/2023 0.1  0.0 - 0.5 K/uL Final   Basophils Relative 06/03/2023 2  % Final   Basophils Absolute 06/03/2023 0.1  0.0 - 0.1 K/uL Final   Immature Granulocytes 06/03/2023 0  % Final   Abs Immature Granulocytes 06/03/2023 0.02  0.00 - 0.07 K/uL Final   Performed at [facility], [address], [place], [place] [address]   Sodium 06/03/2023 140  135 - 145 mmol/L Final   Potassium 06/03/2023 3.4 (L)  3.5 - 5.1 mmol/L Final   Chloride 06/03/2023 104  98 - 111 mmol/L Final   CO2 06/03/2023 26  22 - 32 mmol/L Final   Glucose, Bld 06/03/2023 112 (H)  70 - 99 mg/dL Final   Glucose reference range applies only to samples taken after fasting for at least 8 hours.   BUN 06/03/2023 5 (L)  6 - 20 mg/dL Final   Creatinine, Ser 06/03/2023 1.04  0.61 - 1.24 mg/dL Final   Calcium 07/30/2535 8.3 (L)  8.9 - 10.3 mg/dL Final   Total Protein 64/40/3474 7.0  6.5 - 8.1 g/dL Final   Albumin 25/95/6387 3.2 (L)  3.5 - 5.0 g/dL Final   AST 56/43/3295 258 (H)  15 - 41 U/L Final   ALT 06/03/2023 186 (H)  0 - 44 U/L Final   Alkaline Phosphatase 06/03/2023 107  38 - 126 U/L Final   Total Bilirubin 06/03/2023 0.8  0.3 - 1.2 mg/dL Final   GFR, Estimated 06/03/2023 >60  >60 mL/min Final   Comment: (NOTE) Calculated using the CKD-EPI Creatinine Equation (2021)    Anion gap 06/03/2023 10  5 - 15 Final   Performed at [facility], [address], [place], [place] [address]   Alcohol, Ethyl (B) 06/03/2023 285 (H)  <10 mg/dL Final   Comment: (NOTE) Lowest detectable limit for serum alcohol is 10 mg/dL.  For medical purposes only. Performed at [facility], [address] [place], [place] [address]    POC Amphetamine UR 06/03/2023 None Detected  NONE DETECTED (Cut Off Level 1000 ng/mL) Final   POC Secobarbital (  BAR) 06/03/2023 None Detected  NONE DETECTED (Cut Off Level 300 ng/mL) Final   POC Buprenorphine (BUP) 06/03/2023 None Detected  NONE DETECTED (Cut Off Level 10 ng/mL) Final   POC  Oxazepam (BZO) 06/03/2023 None Detected  NONE DETECTED (Cut Off Level 300 ng/mL) Final   POC Cocaine UR 06/03/2023 Positive (A)  NONE DETECTED (Cut Off Level 300 ng/mL) Final   POC Methamphetamine UR 06/03/2023 None Detected  NONE DETECTED (Cut Off Level 1000 ng/mL) Final   POC Morphine 06/03/2023 Positive (A)  NONE DETECTED (Cut Off Level 300 ng/mL) Final   POC Methadone UR 06/03/2023 None Detected  NONE DETECTED (Cut Off Level 300 ng/mL) Final   POC Oxycodone UR 06/03/2023 None Detected  NONE DETECTED (Cut Off Level 100 ng/mL) Final   POC Marijuana UR 06/03/2023 Positive (A)  NONE DETECTED (Cut Off Level 50 ng/mL) Final  Admission on 05/28/2023, Discharged on 05/28/2023  Component Date Value Ref Range Status   WBC 05/28/2023 11.2 (H)  4.0 - 10.5 K/uL Final   RBC 05/28/2023 4.18 (L)  4.22 - 5.81 MIL/uL Final   Hemoglobin 05/28/2023 14.3  13.0 - 17.0 g/dL Final   HCT 16/07/9603 41.5  39.0 - 52.0 % Final   MCV 05/28/2023 99.3  80.0 - 100.0 fL Final   MCH 05/28/2023 34.2 (H)  26.0 - 34.0 pg Final   MCHC 05/28/2023 34.5  30.0 - 36.0 g/dL Final   RDW 54/06/8118 13.2  11.5 - 15.5 % Final   Platelets 05/28/2023 244  150 - 400 K/uL Final   nRBC 05/28/2023 0.0  0.0 - 0.2 % Final   Neutrophils Relative % 05/28/2023 65  % Final   Neutro Abs 05/28/2023 7.3  1.7 - 7.7 K/uL Final   Lymphocytes Relative 05/28/2023 27  % Final   Lymphs Abs 05/28/2023 3.0  0.7 - 4.0 K/uL Final   Monocytes Relative 05/28/2023 6  % Final   Monocytes Absolute 05/28/2023 0.7  0.1 - 1.0 K/uL Final   Eosinophils Relative 05/28/2023 1  % Final   Eosinophils Absolute 05/28/2023 0.1  0.0 - 0.5 K/uL Final   Basophils Relative 05/28/2023 1  % Final   Basophils Absolute 05/28/2023 0.1  0.0 - 0.1 K/uL Final   Immature Granulocytes 05/28/2023 0  % Final   Abs Immature Granulocytes 05/28/2023 0.04  0.00 - 0.07 K/uL Final   Performed at [facility], [address], [place], [place] [address]   Sodium 05/28/2023 139  135 - 145  mmol/L Final   Potassium 05/28/2023 3.1 (L)  3.5 - 5.1 mmol/L Final   Chloride 05/28/2023 106  98 - 111 mmol/L Final   CO2 05/28/2023 22  22 - 32 mmol/L Final   Glucose, Bld 05/28/2023 100 (H)  70 - 99 mg/dL Final   Glucose reference range applies only to samples taken after fasting for at least 8 hours.   BUN 05/28/2023 6  6 - 20 mg/dL Final   Creatinine, Ser 05/28/2023 0.70  0.61 - 1.24 mg/dL Final   Calcium 95/62/1308 8.0 (L)  8.9 - 10.3 mg/dL Final   GFR, Estimated 05/28/2023 >60  >60 mL/min Final   Comment: (NOTE) Calculated using the CKD-EPI Creatinine Equation (2021)    Anion gap 05/28/2023 11  5 - 15 Final   Performed at [facility], [address] [place], [place] [address]   Alcohol, Ethyl (B) 05/28/2023 279 (H)  <10 mg/dL Final   Comment: (NOTE) Lowest detectable limit for serum alcohol  is 10 mg/dL.  For medical purposes only. Performed at [facility], [address], [place], [place] [address]    Opiates 05/28/2023 POSITIVE (A)  NONE DETECTED Final   Cocaine 05/28/2023 POSITIVE (A)  NONE DETECTED Final   Benzodiazepines 05/28/2023 NONE DETECTED  NONE DETECTED Final   Amphetamines 05/28/2023 POSITIVE (A)  NONE DETECTED Final   Tetrahydrocannabinol 05/28/2023 POSITIVE (A)  NONE DETECTED Final   Barbiturates 05/28/2023 NONE DETECTED  NONE DETECTED Final   Comment: (NOTE) DRUG SCREEN FOR MEDICAL PURPOSES ONLY.  IF CONFIRMATION IS NEEDED FOR ANY PURPOSE, NOTIFY LAB WITHIN 5 DAYS.  LOWEST DETECTABLE LIMITS FOR URINE DRUG SCREEN Drug Class                     Cutoff (ng/mL) Amphetamine and metabolites    1000 Barbiturate and metabolites    200 Benzodiazepine                 200 Opiates and metabolites        300 Cocaine and metabolites        300 THC                            50 Performed at [facility], [address], [place], [place] [address]   There may be more visits with results that are not included.    Blood Alcohol level:  Lab  Results  Component Value Date   ETH <10 11/21/2023   ETH 144 (H) 08/10/2023    Metabolic Disorder Labs: Lab Results  Component Value Date   HGBA1C 5.4 08/04/2023   MPG 108.28 08/04/2023   MPG 111 02/24/2023   Lab Results  Component Value Date   PROLACTIN 31.4 (H) 11/16/2019   Lab Results  Component Value Date   CHOL 147 02/24/2023   TRIG 96 02/24/2023   HDL 42 02/24/2023   CHOLHDL 3.5 02/24/2023   VLDL 19 02/24/2023   LDLCALC 86 02/24/2023   LDLCALC 57 01/14/2023    Therapeutic Lab Levels: No results found for: "LITHIUM" No results found for: "VALPROATE" No results found for: "CBMZ"  Physical Findings   AIMS    Flowsheet Row [facility]) [facility] Ad[facility] (Discharged) [facility] Ad[facility] (Discharged[facility]  AIMS Total Score 0 0 0      AUDIT    Flowsheet Row [facility]) [facility] Ad[facility] (Discharged[facility] Admission (Discharged[facility] INPATIENT ADULT 500B Ad[facility] ([facility]  Alcohol Use Disorder Identification Test Final Score (AUDIT) 26 26 13  37      PHQ2-9    Flowsheet Row ED from 11/21/2023 in [facility] ED from 06/03/2023 in [facility]  PHQ-2 Total Score 6 0  PHQ-9 Total Score 22 --      Flowsheet Row ED from 11/21/2023 in [facility] Most recent reading at 11/22/2023 12:50 AM ED from 11/21/2023 in [facility] Most recent reading at 11/21/2023  7:45 PM ED from 11/21/2023 in [facility] at [facility] Most  recent reading at 11/21/2023 11:06 AM  C-SSRS  RISK CATEGORY No Risk High Risk No Risk       CLINICAL DATA:  Initial evaluation for acute low back pain, history of IVDU.   EXAM 11/20/2023: MRI LUMBAR SPINE WITHOUT AND WITH CONTRAST   IMPRESSION: Normal MRI of the lumbar spine. No evidence for acute infection or other abnormality. No significant disc pathology, stenosis, or evidence for neural impingement.       Musculoskeletal  Strength & Muscle Tone: within normal limits Gait & Station: normal Patient leans: Right  Psychiatric Specialty Exam  Presentation  General Appearance:  Appropriate for Environment; Fairly Groomed  Eye Contact: Minimal  Speech: Clear and Coherent  Speech Volume: Normal  Handedness: Right   Mood and Affect  Mood: depression, anxiety, irritable  Affect: Congruent   Thought Process  Thought Processes: Coherent; Linear  Descriptions of Associations:Intact  Orientation:Full (Time, Place and Person)  Thought Content:Logical  Diagnosis of Schizophrenia or Schizoaffective disorder in past: No  Duration of Psychotic Symptoms: N/A   Hallucinations:Hallucinations: None   Ideas of Reference:None  Suicidal Thoughts:Suicidal Thoughts: Yes, Passive SI Passive Intent and/or Plan: Without Intent; Without Plan; Without Means to Carry Out; Without Access to Means   Homicidal Thoughts:Homicidal Thoughts: No    Sensorium  Memory: Immediate Fair; Recent Fair; Remote Fair  Judgment: Fair  Insight: Fair   Art therapist  Concentration: Fair  Attention Span: Fair  Recall: [employer] of Knowledge: Fair  Language: Fair   Psychomotor Activity  Psychomotor Activity: Psychomotor Activity: Decreased    Assets  Assets: Communication Skills; Desire for Improvement; Physical Health; Resilience   Sleep  Sleep: Sleep: Poor    No data recorded   Physical Exam  Physical Exam Vitals and nursing note  reviewed.  Constitutional:      General: He is not in acute distress. HENT:     Nose: No congestion.  Cardiovascular:     Rate and Rhythm: Normal rate.  Pulmonary:     Effort: No respiratory distress.  Neurological:     Mental Status: He is alert.  Psychiatric:        Attention and Perception: He does not perceive auditory or visual hallucinations.        Mood and Affect: Mood is anxious and depressed.        Behavior: Behavior normal. Behavior is not agitated or aggressive.        Thought Content: Thought content normal. Thought content is not paranoid or delusional. Thought content does not include homicidal or suicidal ideation.        Judgment: Judgment normal.    Review of Systems  Constitutional:  Negative for chills and fever.  Cardiovascular:  Negative for chest pain and palpitations.  Gastrointestinal:  Negative for abdominal pain, nausea and vomiting.  Musculoskeletal:  Positive for back pain and joint pain.  Neurological:  Negative for dizziness, tremors and headaches.  Psychiatric/Behavioral:  Positive for depression, substance abuse and suicidal ideas (passive). Negative for hallucinations and memory loss. The patient is nervous/anxious and has insomnia.   All other systems reviewed and are negative.  Blood pressure 122/73, pulse 98, temperature 99.6 F (37.6 C), temperature source Oral, resp. rate 17, SpO2 96%. There is no height or weight on file to calculate BMI.  Treatment Plan Summary: Daily contact with patient to assess and evaluate symptoms and progress in treatment  #Alcohol Use Disorder -Continue CIWA protocol with chlordiazepoxide taper -Thiamine injection 100 mg once then multivitamin po daily  # Depression and mood instability --  Start Cymbalta 30 mg daily today for depression with benefit to decrease neuropathic pain. -- Start gabapentin 300 mg 3 times daily for mood stabilization and neuropathic pain             -- Patient made aware that this  medication may not be accepted to be continued at his residential substance use program.  Nicotine use disorder Patient reports 3 cigarettes a day. -- Start NicoDerm CQ patch 7 mg every 24 hours  #History of Opoid Use - COWS protocol  PRNs -Acetaminophen 650 mg as needed q6hrs for mild pain -Maalox/Mylanta 30 ml as needed q4hrs for indigestion -dicyclomine 20 mg as needed q6hrs for abdominal cramping -hydroxyzine 25 mg as needed for anxiety -loperamide 2-4 mg as needed diarrhea -Magnesium Hydroxide suspension 30 ml as needed for constipation -Naproxen tab 500 mg BID as needed aching/pain -Methocarbamol tab 500mg  as need for muscle spasms  -Ondansetron disintegrating tab 4 mg q6hrs as needed for nausea  Labs reviewed. UDS positive for BZD(received ativan while in the ED on 02/16). Albumin is low, recommend increase in protein intake.  Mildly elevated AST/ALT-follow up with PCP, likely will return to normal following cessation of alcohol and illicit substances. UDS positive for BZD, Cocaine and THC.   Repeat labs on 11/25/2023 CMP for reevaluation of liver function; hemoglobin A1c in the setting of elevated blood sugars; iron profile and vitamin B12 for further evaluation of what appears to be a normocytic anemia but suspect may have deficiency or B12 deficiency  Disposition: Patient meets criteria for inpatient rehab. LCSW has secured placement at [facility].    Mariel Craft, MD 11/24/2023 4:25 PM

## 2023-11-24 NOTE — Group Note (Signed)
 Group Topic: Fears and Unhealthy Coping Skills  Group Date: 11/24/2023 Start Time: 2100 End Time: 2200 Facilitators: Rae Lips B  Department: [facility]  Number of Participants: 4  Group Focus: abuse issues, acceptance, activities of daily living skills, anxiety, chemical dependency education, chemical dependency issues, clarity of thought, co-dependency, communication, community group, coping skills, daily focus, depression, diagnosis education, dual diagnosis, family, and feeling awareness/expression Treatment Modality:  Individual Therapy, Leisure Counsellor, Psychoeducation, Optician, dispensing, Solution-Focused Therapy, and Spiritual Interventions utilized were leisure development, patient education, problem solving, story telling, and support Purpose: express feelings, express irrational fears, improve communication skills, increase insight, and regain self-worth  Name: Mark Hammond Date of Birth: 06-12-1991  MR: [id number]    Level of Participation: PT DID NOT ATTEND GROUP Quality of Participation: cooperative Interactions with others: gave feedback Mood/Affect: appropriate Triggers (if applicable): NA Cognition: coherent/clear Progress: None Response: NA Plan: patient will be encouraged to go to groups.   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

## 2023-11-24 NOTE — Group Note (Signed)
 Group Topic: Social Support  Group Date: 11/24/2023 Start Time: 1000 End Time: 1045 Facilitators: Loyce Dys, NT  Department: [facility]  Number of Participants: 2  Group Focus: goals/reality orientation Treatment Modality:  Patient-Centered Therapy Interventions utilized were support Purpose: relapse prevention strategies  Name: Mark Hammond Date of Birth: 1991/04/04  MR: [id number]    Level of Participation: PT did not attend Quality of Participation:  Interactions with others:  Mood/Affect:  Triggers (if applicable):  Cognition:  Progress:  Response:  Plan: follow-up needed  Patients Problems:  Patient Active Problem List   Diagnosis Date Noted   Polysubstance abuse (HCC) 11/21/2023   Alcohol use disorder 08/04/2023   Stimulant use disorder 08/04/2023   Opioid use disorder 08/04/2023   Tobacco use disorder 08/04/2023   Cannabis use disorder 08/04/2023   Hepatitis C 03/01/2023   Anxiety state 01/14/2023   Insomnia 01/14/2023   Schizoaffective disorder (HCC) 01/13/2023   Schizophrenia (HCC) 11/15/2019   Polysubstance (including opioids) dependence, daily use (HCC)    Substance induced mood disorder (HCC) 10/24/2019   Methamphetamine use disorder, 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

## 2023-11-24 NOTE — ED Notes (Signed)
 Patient is sleeping. Respirations equal and unlabored, skin warm and dry. No change in assessment or acuity. Routine safety checks conducted according to facility protocol. Will continue to monitor for safety.

## 2023-11-25 DIAGNOSIS — Z91148 Patient's other noncompliance with medication regimen for other reason: Secondary | ICD-10-CM | POA: Diagnosis not present

## 2023-11-25 DIAGNOSIS — T50906S Underdosing of unspecified drugs, medicaments and biological substances, sequela: Secondary | ICD-10-CM | POA: Diagnosis not present

## 2023-11-25 DIAGNOSIS — F1910 Other psychoactive substance abuse, uncomplicated: Secondary | ICD-10-CM | POA: Diagnosis present

## 2023-11-25 DIAGNOSIS — Z9151 Personal history of suicidal behavior: Secondary | ICD-10-CM | POA: Diagnosis not present

## 2023-11-25 DIAGNOSIS — F1510 Other stimulant abuse, uncomplicated: Secondary | ICD-10-CM | POA: Diagnosis not present

## 2023-11-25 DIAGNOSIS — R45851 Suicidal ideations: Secondary | ICD-10-CM | POA: Diagnosis present

## 2023-11-25 DIAGNOSIS — F1010 Alcohol abuse, uncomplicated: Secondary | ICD-10-CM | POA: Diagnosis not present

## 2023-11-25 DIAGNOSIS — F250 Schizoaffective disorder, bipolar type: Secondary | ICD-10-CM | POA: Insufficient documentation

## 2023-11-25 DIAGNOSIS — F1410 Cocaine abuse, uncomplicated: Secondary | ICD-10-CM | POA: Diagnosis not present

## 2023-11-25 DIAGNOSIS — Z560 Unemployment, unspecified: Secondary | ICD-10-CM | POA: Insufficient documentation

## 2023-11-25 LAB — COMPREHENSIVE METABOLIC PANEL
ALT: 58 U/L — ABNORMAL HIGH (ref 0–44)
AST: 81 U/L — ABNORMAL HIGH (ref 15–41)
Albumin: 2.3 g/dL — ABNORMAL LOW (ref 3.5–5.0)
Alkaline Phosphatase: 84 U/L (ref 38–126)
Anion gap: 8 (ref 5–15)
BUN: 9 mg/dL (ref 6–20)
CO2: 27 mmol/L (ref 22–32)
Calcium: 8.6 mg/dL — ABNORMAL LOW (ref 8.9–10.3)
Chloride: 101 mmol/L (ref 98–111)
Creatinine, Ser: 0.68 mg/dL (ref 0.61–1.24)
GFR, Estimated: >60 mL/min (ref >60)
Glucose, Bld: 108 mg/dL — ABNORMAL HIGH (ref 70–99)
Potassium: 4.2 mmol/L (ref 3.5–5.1)
Sodium: 136 mmol/L (ref 135–145)
Total Bilirubin: 0.7 mg/dL (ref 0.0–1.2)
Total Protein: 7 g/dL (ref 6.5–8.1)

## 2023-11-25 LAB — IRON AND TIBC
Iron: 21 ug/dL — ABNORMAL LOW (ref 45–182)
Saturation Ratios: 8 % — ABNORMAL LOW (ref 17.9–39.5)
TIBC: 280 ug/dL (ref 250–450)
UIBC: 259 ug/dL

## 2023-11-25 LAB — HEMOGLOBIN A1C
Hgb A1c MFr Bld: 5.9 % — ABNORMAL HIGH (ref 4.8–5.6)
Mean Plasma Glucose: 122.63 mg/dL

## 2023-11-25 LAB — VITAMIN B12: Vitamin B-12: 549 pg/mL (ref 180–914)

## 2023-11-25 LAB — SARS CORONAVIRUS 2 BY RT PCR: SARS Coronavirus 2 by RT PCR: NEGATIVE

## 2023-11-25 LAB — FERRITIN: Ferritin: 217 ng/mL (ref 24–336)

## 2023-11-25 NOTE — ED Notes (Signed)
 Patient A&Ox4. Denies intent/thoughts to harm self/others when asked. Denies A/VH. Patient reports 7/10 aching pain to left leg when asked. No acute distress noted. Support and encouragement provided. Routine safety checks conducted according to facility protocol. Encouraged patient to notify staff if thoughts of harm toward self or others arise. Endorses safety. Patient verbalized understanding and agreement. Will continue to monitor for safety.

## 2023-11-25 NOTE — ED Notes (Signed)
 PRN Tylenol and Naproxen given due to patient reports of pain in legs rating 7/10. Medication administered with no complications. Environment secured, safety checks in place per facility policy.

## 2023-11-25 NOTE — ED Notes (Signed)
Pt observed/assessed in room sleeping. RR even and unlabored, appearing in no noted distress. Environmental check complete, will continue to monitor for safety 

## 2023-11-25 NOTE — ED Notes (Signed)
 DASH called for specimen pickup

## 2023-11-25 NOTE — Group Note (Signed)
 Group Topic: Relapse and Recovery  Group Date: 11/25/2023 Start Time: 2000 End Time: 2100 Facilitators: Rae Lips B  Department: [facility]  Number of Participants: 3  Group Focus: abuse issues, activities of daily living skills, chemical dependency education, and chemical dependency issues Treatment Modality:  Psychoeducation Interventions utilized were leisure development, patient education, problem solving, story telling, and support Purpose: enhance coping skills and relapse prevention strategies  Name: Mark Hammond Date of Birth: [birth year]  MR: [id number]    Level of Participation: PT DID NOT GO TO GROUPS Quality of Participation: cooperative Interactions with others: gave feedback Mood/Affect: appropriate Triggers (if applicable): NA Cognition: coherent/clear Progress: None Response: NA Plan: patient will be encouraged to go to groups.  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

## 2023-11-25 NOTE — ED Notes (Signed)
 Patient in bedroom in no apparent acute distress, calm and collected. Environment secured. Safety checks in place according to facility policy.

## 2023-11-25 NOTE — ED Notes (Signed)
 Patient observed/assessed in room in bed appearing in no immediate distress resting peacefully. Q15 minute checks continued by MHT and nursing staff. Will continue to monitor and support.

## 2023-11-25 NOTE — ED Notes (Signed)
 Pt isolates to room. Denies c/o withdrawal symptoms . Voices c/o chronic back pain. Voices passive SI but contracts  for safety. Will continue to monitor for safety

## 2023-11-25 NOTE — ED Provider Notes (Signed)
 Behavioral Health Progress Note  Date and Time: 11/25/2023 2:00 PM Name: Mark Hammond MRN:  [id number]  Subjective:   The patient is a 33 year old male with a history of polysubstance use and numerous previous psychiatric hospitalizations.  He presented to the [facility] behavioral urgent care requesting help with alcohol, cocaine, and methamphetamine use.  He reported passive suicidal thoughts at that time.  He was admitted to the facility based crisis.  He is scheduled to discharge to [facility] residential treatment on Monday.  The patient denies auditory/visual hallucinations.  The patient reports good mood, appetite, and sleep. They deny suicidal and homicidal thoughts. The patient denies side effects from their medications. The patient denies experiencing any withdrawal symptoms.   Diagnosis:  Final diagnoses:  Alcohol abuse  H/O medication noncompliance    Total Time spent with patient: 15 minutes  Past Psychiatric History: See H&P and above Past Medical History: See H&P and above Family History: See H&P and above Family Psychiatric  History: See H&P and above Social History: See H&P and above  Additional Social History:  none  Sleep: fair  Appetite:  fair  Current Medications:  Current Facility-Administered Medications  Medication Dose Route Frequency Provider Last Rate Last Admin   acetaminophen (TYLENOL) tablet 650 mg  650 mg Oral Q6H PRN Sindy Guadeloupe, NP   650 mg at 11/25/23 0944   alum & mag hydroxide-simeth (MAALOX/MYLANTA) 200-200-20 MG/5ML suspension 30 mL  30 mL Oral Q4H PRN Sindy Guadeloupe, NP       dicyclomine (BENTYL) tablet 20 mg  20 mg Oral Q6H PRN Sindy Guadeloupe, NP   20 mg at 11/22/23 1507   DULoxetine (CYMBALTA) DR capsule 30 mg  30 mg Oral Daily Mariel Craft, MD   30 mg at 11/25/23 0944   gabapentin (NEURONTIN) capsule 600 mg  600 mg Oral TID Mariel Craft, MD   600 mg at 11/25/23 0944   hydrOXYzine (ATARAX) tablet 25 mg  25 mg Oral Q6H PRN  Sindy Guadeloupe, NP   25 mg at 11/24/23 2219   loperamide (IMODIUM) capsule 2-4 mg  2-4 mg Oral PRN Sindy Guadeloupe, NP       magnesium hydroxide (MILK OF MAGNESIA) suspension 30 mL  30 mL Oral Daily PRN Sindy Guadeloupe, NP       methocarbamol (ROBAXIN) tablet 500 mg  500 mg Oral Q8H PRN Sindy Guadeloupe, NP   500 mg at 11/25/23 1230   multivitamin with minerals tablet 1 tablet  1 tablet Oral Daily Maryagnes Amos, FNP   1 tablet at 11/25/23 0944   naproxen (NAPROSYN) tablet 500 mg  500 mg Oral BID PRN Sindy Guadeloupe, NP   500 mg at 11/25/23 0941   nicotine (NICODERM CQ - dosed in mg/24 hr) patch 7 mg  7 mg Transdermal Daily Mariel Craft, MD   7 mg at 11/24/23 1136   OLANZapine (ZYPREXA) injection 10 mg  10 mg Intramuscular TID PRN Sindy Guadeloupe, NP       OLANZapine (ZYPREXA) injection 5 mg  5 mg Intramuscular TID PRN Sindy Guadeloupe, NP       OLANZapine zydis (ZYPREXA) disintegrating tablet 5 mg  5 mg Oral TID PRN Sindy Guadeloupe, NP       ondansetron (ZOFRAN-ODT) disintegrating tablet 4 mg  4 mg Oral Q6H PRN Sindy Guadeloupe, NP       thiamine (VITAMIN B1) injection 100 mg  100 mg Intramuscular Once Maryagnes Amos, FNP       traZODone (  DESYREL) tablet 50 mg  50 mg Oral QHS PRN Onuoha, Chinwendu V, NP   50 mg at 11/24/23 2219   Current Outpatient Medications  Medication Sig Dispense Refill   naloxone (NARCAN) nasal spray 4 mg/0.1 mL Place 1 spray into the nose once as needed (for an opioid crisis).     OVER THE COUNTER MEDICATION Take 1,000 mg by mouth every 6 (six) hours as needed (For pain). Tylenol Extra Strength Rapid Release Gels (Acetaminophen 500mg )     ADVIL 200 MG CAPS Take 1-2 capsules (200-400 mg total) by mouth every 6 (six) hours as needed (for pain or headaches). (Patient not taking: Reported on 11/22/2023) 40 capsule 0   methocarbamol (ROBAXIN) 500 MG tablet Take 1 tablet (500 mg total) by mouth every 8 (eight) hours as needed. (Patient not taking: Reported on 11/22/2023) 8 tablet  0    Labs  Lab Results:  Admission on 11/21/2023  Component Date Value Ref Range Status   WBC 11/21/2023 5.6  4.0 - 10.5 K/uL Final   RBC 11/21/2023 4.14 (L)  4.22 - 5.81 MIL/uL Final   Hemoglobin 11/21/2023 13.1  13.0 - 17.0 g/dL Final   HCT 16/07/9603 38.8 (L)  39.0 - 52.0 % Final   MCV 11/21/2023 93.7  80.0 - 100.0 fL Final   MCH 11/21/2023 31.6  26.0 - 34.0 pg Final   MCHC 11/21/2023 33.8  30.0 - 36.0 g/dL Final   RDW 54/06/8118 13.2  11.5 - 15.5 % Final   Platelets 11/21/2023 298  150 - 400 K/uL Final   nRBC 11/21/2023 0.0  0.0 - 0.2 % Final   Neutrophils Relative % 11/21/2023 83  % Final   Neutro Abs 11/21/2023 4.6  1.7 - 7.7 K/uL Final   Lymphocytes Relative 11/21/2023 14  % Final   Lymphs Abs 11/21/2023 0.8  0.7 - 4.0 K/uL Final   Monocytes Relative 11/21/2023 3  % Final   Monocytes Absolute 11/21/2023 0.2  0.1 - 1.0 K/uL Final   Eosinophils Relative 11/21/2023 0  % Final   Eosinophils Absolute 11/21/2023 0.0  0.0 - 0.5 K/uL Final   Basophils Relative 11/21/2023 0  % Final   Basophils Absolute 11/21/2023 0.0  0.0 - 0.1 K/uL Final   Immature Granulocytes 11/21/2023 0  % Final   Abs Immature Granulocytes 11/21/2023 0.01  0.00 - 0.07 K/uL Final   Performed at [facility], [address], [place], [place] [address]   Sodium 11/21/2023 135  135 - 145 mmol/L Final   Potassium 11/21/2023 4.2  3.5 - 5.1 mmol/L Final   Chloride 11/21/2023 99  98 - 111 mmol/L Final   CO2 11/21/2023 26  22 - 32 mmol/L Final   Glucose, Bld 11/21/2023 162 (H)  70 - 99 mg/dL Final   Glucose reference range applies only to samples taken after fasting for at least 8 hours.   BUN 11/21/2023 7  6 - 20 mg/dL Final   Creatinine, Ser 11/21/2023 0.63  0.61 - 1.24 mg/dL Final   Calcium 95/62/1308 8.9  8.9 - 10.3 mg/dL Final   Total Protein 65/78/4696 8.1  6.5 - 8.1 g/dL Final   Albumin 29/52/8413 2.8 (L)  3.5 - 5.0 g/dL Final   AST 24/40/1027 91 (H)  15 - 41 U/L Final   ALT 11/21/2023 55 (H)  0 - 44  U/L Final   Alkaline Phosphatase 11/21/2023 87  38 - 126 U/L Final   Total Bilirubin 11/21/2023 0.8  0.0 - 1.2  mg/dL Final   GFR, Estimated 11/21/2023 >60  >60 mL/min Final   Comment: (NOTE) Calculated using the CKD-EPI Creatinine Equation (2021)    Anion gap 11/21/2023 10  5 - 15 Final   Performed at [facility], [address] [place], [place] [address]   Alcohol, Ethyl (B) 11/21/2023 <10  <10 mg/dL Final   Comment: (NOTE) Lowest detectable limit for serum alcohol is 10 mg/dL.  For medical purposes only. Performed at [facility], [address] [place], [place] [address]    TSH 11/21/2023 0.365  0.350 - 4.500 uIU/mL Final   Comment: Performed by a 3rd Generation assay with a functional sensitivity of <=0.01 uIU/mL. Performed at [facility], [address] [place], [place] [address]    POC Amphetamine UR 11/22/2023 None Detected  NONE DETECTED (Cut Off Level 1000 ng/mL) Final   POC Secobarbital (BAR) 11/22/2023 None Detected  NONE DETECTED (Cut Off Level 300 ng/mL) Final   POC Buprenorphine (BUP) 11/22/2023 None Detected  NONE DETECTED (Cut Off Level 10 ng/mL) Final   POC Oxazepam (BZO) 11/22/2023 Positive (A)  NONE DETECTED (Cut Off Level 300 ng/mL) Final   POC Cocaine UR 11/22/2023 Positive (A)  NONE DETECTED (Cut Off Level 300 ng/mL) Final   POC Methamphetamine UR 11/22/2023 None Detected  NONE DETECTED (Cut Off Level 1000 ng/mL) Final   POC Morphine 11/22/2023 None Detected  NONE DETECTED (Cut Off Level 300 ng/mL) Final   POC Methadone UR 11/22/2023 None Detected  NONE DETECTED (Cut Off Level 300 ng/mL) Final   POC Oxycodone UR 11/22/2023 None Detected  NONE DETECTED (Cut Off Level 100 ng/mL) Final   POC Marijuana UR 11/22/2023 Positive (A)  NONE DETECTED (Cut Off Level 50 ng/mL) Final   Hgb A1c MFr Bld 11/25/2023 5.9 (H)  4.8 - 5.6 % Final   Comment: (NOTE) Pre diabetes:          5.7%-6.4%  Diabetes:              >6.4%  Glycemic control for    <7.0% adults with diabetes    Mean Plasma Glucose 11/25/2023 122.63  mg/dL Final   Performed at [facility], [address] [place], [place] [address]   Sodium 11/25/2023 136  135 - 145 mmol/L Final   Potassium 11/25/2023 4.2  3.5 - 5.1 mmol/L Final   Chloride 11/25/2023 101  98 - 111 mmol/L Final   CO2 11/25/2023 27  22 - 32 mmol/L Final   Glucose, Bld 11/25/2023 108 (H)  70 - 99 mg/dL Final   Glucose reference range applies only to samples taken after fasting for at least 8 hours.   BUN 11/25/2023 9  6 - 20 mg/dL Final   Creatinine, Ser 11/25/2023 0.68  0.61 - 1.24 mg/dL Final   Calcium 29/56/2130 8.6 (L)  8.9 - 10.3 mg/dL Final   Total Protein 86/57/8469 7.0  6.5 - 8.1 g/dL Final   Albumin 62/95/2841 2.3 (L)  3.5 - 5.0 g/dL Final   AST 32/44/0102 81 (H)  15 - 41 U/L Final   ALT 11/25/2023 58 (H)  0 - 44 U/L Final   Alkaline Phosphatase 11/25/2023 84  38 - 126 U/L Final   Total Bilirubin 11/25/2023 0.7  0.0 - 1.2 mg/dL Final   GFR, Estimated 11/25/2023 >60  >60 mL/min Final   Comment: (NOTE) Calculated using the CKD-EPI Creatinine Equation (2021)    Anion gap 11/25/2023 8  5 - 15  Final   Performed at [facility], [address], [place], [place] [address]   Iron 11/25/2023 21 (L)  45 - 182 ug/dL Final   TIBC 19/14/7829 280  250 - 450 ug/dL Final   Saturation Ratios 11/25/2023 8 (L)  17.9 - 39.5 % Final   UIBC 11/25/2023 259  ug/dL Final   Performed at [facility], [address] [place], [place] [address]   Vitamin B-12 11/25/2023 549  180 - 914 pg/mL Final   Comment: (NOTE) This assay is not validated for testing neonatal or myeloproliferative syndrome specimens for Vitamin B12 levels. Performed at [facility] [place] [facility], [address], [place], [place] [address]    Ferritin 11/25/2023 217  24 - 336 ng/mL Final   Performed at [facility], [address] [place], [place] [address]  Admission on 11/19/2023, Discharged on 11/20/2023  Component Date  Value Ref Range Status   Sodium 11/19/2023 138  135 - 145 mmol/L Final   Potassium 11/19/2023 3.4 (L)  3.5 - 5.1 mmol/L Final   Chloride 11/19/2023 103  98 - 111 mmol/L Final   CO2 11/19/2023 26  22 - 32 mmol/L Final   Glucose, Bld 11/19/2023 123 (H)  70 - 99 mg/dL Final   Glucose reference range applies only to samples taken after fasting for at least 8 hours.   BUN 11/19/2023 <5 (L)  6 - 20 mg/dL Final   Creatinine, Ser 11/19/2023 0.50 (L)  0.61 - 1.24 mg/dL Final   Calcium 29/52/8413 8.2 (L)  8.9 - 10.3 mg/dL Final   GFR, Estimated 11/19/2023 >60  >60 mL/min Final   Comment: (NOTE) Calculated using the CKD-EPI Creatinine Equation (2021)    Anion gap 11/19/2023 9  5 - 15 Final   Performed at [facility], [address] [place], [place] [address]   WBC 11/19/2023 8.4  4.0 - 10.5 K/uL Final   RBC 11/19/2023 4.00 (L)  4.22 - 5.81 MIL/uL Final   Hemoglobin 11/19/2023 12.6 (L)  13.0 - 17.0 g/dL Final   HCT 02/72/5366 37.7 (L)  39.0 - 52.0 % Final   MCV 11/19/2023 94.3  80.0 - 100.0 fL Final   MCH 11/19/2023 31.5  26.0 - 34.0 pg Final   MCHC 11/19/2023 33.4  30.0 - 36.0 g/dL Final   RDW 44/12/4740 13.2  11.5 - 15.5 % Final   Platelets 11/19/2023 267  150 - 400 K/uL Final   nRBC 11/19/2023 0.0  0.0 - 0.2 % Final   Neutrophils Relative % 11/19/2023 65  % Final   Neutro Abs 11/19/2023 5.5  1.7 - 7.7 K/uL Final   Lymphocytes Relative 11/19/2023 22  % Final   Lymphs Abs 11/19/2023 1.9  0.7 - 4.0 K/uL Final   Monocytes Relative 11/19/2023 11  % Final   Monocytes Absolute 11/19/2023 0.9  0.1 - 1.0 K/uL Final   Eosinophils Relative 11/19/2023 1  % Final   Eosinophils Absolute 11/19/2023 0.1  0.0 - 0.5 K/uL Final   Basophils Relative 11/19/2023 1  % Final   Basophils Absolute 11/19/2023 0.1  0.0 - 0.1 K/uL Final   Immature Granulocytes 11/19/2023 0  % Final   Abs Immature Granulocytes 11/19/2023 0.03  0.00 - 0.07 K/uL Final   Performed at [facility],  [address], [place], [place] [address]   Sed Rate 11/19/2023 77 (H)  0 - 16 mm/hr Final   Performed at [facility],  [address] [place], [place] [address]   CRP 11/19/2023 4.2 (H)  <1.0 mg/dL Final   Performed at [facility], [address] [place], [place] [address]   Sodium 11/19/2023 141  135 - 145 mmol/L Final   Potassium 11/19/2023 3.5  3.5 - 5.1 mmol/L Final   Chloride 11/19/2023 101  98 - 111 mmol/L Final   BUN 11/19/2023 <3 (L)  6 - 20 mg/dL Final   Creatinine, Ser 11/19/2023 0.70  0.61 - 1.24 mg/dL Final   Glucose, Bld 91/47/8295 126 (H)  70 - 99 mg/dL Final   Glucose reference range applies only to samples taken after fasting for at least 8 hours.   Calcium, Ion 11/19/2023 1.05 (L)  1.15 - 1.40 mmol/L Final   TCO2 11/19/2023 27  22 - 32 mmol/L Final   Hemoglobin 11/19/2023 12.9 (L)  13.0 - 17.0 g/dL Final   HCT 62/13/0865 38.0 (L)  39.0 - 52.0 % Final   SARS Coronavirus 2 by RT PCR 11/19/2023 NEGATIVE  NEGATIVE Final   Comment: (NOTE) SARS-CoV-2 target nucleic acids are NOT DETECTED.  The SARS-CoV-2 RNA is generally detectable in upper respiratory specimens during the acute phase of infection. The lowest concentration of SARS-CoV-2 viral copies this assay can detect is 138 copies/mL. A negative result does not preclude SARS-Cov-2 infection and should not be used as the sole basis for treatment or other patient management decisions. A negative result may occur with  improper specimen collection/handling, submission of specimen other than nasopharyngeal swab, presence of viral mutation(s) within the areas targeted by this assay, and inadequate number of viral copies(<138 copies/mL). A negative result must be combined with clinical observations, patient history, and epidemiological information. The expected result is Negative.  Fact Sheet for Patients:  BloggerCourse.com  Fact Sheet for Healthcare Providers:   SeriousBroker.it  This test is no                          t yet approved or cleared by the [place] FDA and  has been authorized for detection and/or diagnosis of SARS-CoV-2 by FDA under an Emergency Use Authorization (EUA). This EUA will remain  in effect (meaning this test can be used) for the duration of the COVID-19 declaration under Section 564(b)(1) of the Act, 21 U.S.C.section 360bbb-3(b)(1), unless the authorization is terminated  or revoked sooner.       Influenza A by PCR 11/19/2023 NEGATIVE  NEGATIVE Final   Influenza B by PCR 11/19/2023 NEGATIVE  NEGATIVE Final   Comment: (NOTE) The Xpert Xpress SARS-CoV-2/FLU/RSV plus assay is intended as an aid in the diagnosis of influenza from Nasopharyngeal swab specimens and should not be used as a sole basis for treatment. Nasal washings and aspirates are unacceptable for Xpert Xpress SARS-CoV-2/FLU/RSV testing.  Fact Sheet for Patients: BloggerCourse.com  Fact Sheet for Healthcare Providers: SeriousBroker.it  This test is not yet approved or cleared by the [place] FDA and has been authorized for detection and/or diagnosis of SARS-CoV-2 by FDA under an Emergency Use Authorization (EUA). This EUA will remain in effect (meaning this test can be used) for the duration of the COVID-19 declaration under Section 564(b)(1) of the Act, 21 U.S.C. section 360bbb-3(b)(1), unless the authorization is terminated or revoked.     Resp Syncytial Virus by PCR 11/19/2023 NEGATIVE  NEGATIVE Final   Comment: (NOTE) Fact Sheet for Patients: BloggerCourse.com  Fact Sheet for Healthcare Providers: SeriousBroker.it  This test is  not yet approved or cleared by the [place] and has been authorized for detection and/or diagnosis of SARS-CoV-2 by FDA under an Emergency Use Authorization (EUA). This EUA  will remain in effect (meaning this test can be used) for the duration of the COVID-19 declaration under Section 564(b)(1) of the Act, 21 U.S.C. section 360bbb-3(b)(1), unless the authorization is terminated or revoked.  Performed at [facility], [address] [place], [place] [address]   Admission on 11/09/2023, Discharged on 11/09/2023  Component Date Value Ref Range Status   Color, Urine 11/09/2023 AMBER (A)  YELLOW Final   BIOCHEMICALS MAY BE AFFECTED BY COLOR   APPearance 11/09/2023 CLEAR  CLEAR Final   Specific Gravity, Urine 11/09/2023 1.036 (H)  1.005 - 1.030 Final   pH 11/09/2023 5.0  5.0 - 8.0 Final   Glucose, UA 11/09/2023 NEGATIVE  NEGATIVE mg/dL Final   Hgb urine dipstick 11/09/2023 NEGATIVE  NEGATIVE Final   Bilirubin Urine 11/09/2023 SMALL (A)  NEGATIVE Final   Ketones, ur 11/09/2023 NEGATIVE  NEGATIVE mg/dL Final   Protein, ur 60/45/4098 30 (A)  NEGATIVE mg/dL Final   Nitrite 11/91/4782 NEGATIVE  NEGATIVE Final   Leukocytes,Ua 11/09/2023 NEGATIVE  NEGATIVE Final   RBC / HPF 11/09/2023 0-5  0 - 5 RBC/hpf Final   WBC, UA 11/09/2023 0-5  0 - 5 WBC/hpf Final   Bacteria, UA 11/09/2023 NONE SEEN  NONE SEEN Final   Squamous Epithelial / HPF 11/09/2023 0-5  0 - 5 /HPF Final   Mucus 11/09/2023 PRESENT   Final   Performed at [facility], [address], [place], [place] [address]   Opiates 11/09/2023 POSITIVE (A)  NONE DETECTED Final   Cocaine 11/09/2023 POSITIVE (A)  NONE DETECTED Final   Benzodiazepines 11/09/2023 POSITIVE (A)  NONE DETECTED Final   Amphetamines 11/09/2023 NONE DETECTED  NONE DETECTED Final   Tetrahydrocannabinol 11/09/2023 POSITIVE (A)  NONE DETECTED Final   Barbiturates 11/09/2023 NONE DETECTED  NONE DETECTED Final   Comment: (NOTE) DRUG SCREEN FOR MEDICAL PURPOSES ONLY.  IF CONFIRMATION IS NEEDED FOR ANY PURPOSE, NOTIFY LAB WITHIN 5 DAYS.  LOWEST DETECTABLE LIMITS FOR URINE DRUG SCREEN Drug Class                      Cutoff (ng/mL) Amphetamine and metabolites    1000 Barbiturate and metabolites    200 Benzodiazepine                 200 Opiates and metabolites        300 Cocaine and metabolites        300 THC                            50 Performed at [facility], [address] [place], [place] [address]   Admission on 08/10/2023, Discharged on 08/14/2023  Component Date Value Ref Range Status   Sodium 08/10/2023 137  135 - 145 mmol/L Final   Potassium 08/10/2023 3.4 (L)  3.5 - 5.1 mmol/L Final   Chloride 08/10/2023 100  98 - 111 mmol/L Final   CO2 08/10/2023 25  22 - 32 mmol/L Final   Glucose, Bld 08/10/2023 109 (H)  70 - 99 mg/dL Final   Glucose reference range applies only to samples taken after fasting for at least 8 hours.   BUN 08/10/2023 11  6 - 20 mg/dL Final   Creatinine, Ser  08/10/2023 0.71  0.61 - 1.24 mg/dL Final   Calcium 40/98/1191 8.8 (L)  8.9 - 10.3 mg/dL Final   Total Protein 47/82/9562 7.9  6.5 - 8.1 g/dL Final   Albumin 13/05/6577 3.8  3.5 - 5.0 g/dL Final   AST 46/96/2952 466 (H)  15 - 41 U/L Final   ALT 08/10/2023 264 (H)  0 - 44 U/L Final   Alkaline Phosphatase 08/10/2023 112  38 - 126 U/L Final   Total Bilirubin 08/10/2023 1.2 (H)  <1.2 mg/dL Final   GFR, Estimated 08/10/2023 >60  >60 mL/min Final   Comment: (NOTE) Calculated using the CKD-EPI Creatinine Equation (2021)    Anion gap 08/10/2023 12  5 - 15 Final   Performed at [facility], [address] [place], [place] [address]   Alcohol, Ethyl (B) 08/10/2023 144 (H)  <10 mg/dL Final   Comment: (NOTE) Lowest detectable limit for serum alcohol is 10 mg/dL.  For medical purposes only. Performed at [facility], [address], [place], [place] [address]    Salicylate Lvl 08/10/2023 <7.0 (L)  7.0 - 30.0 mg/dL Final   Performed at [facility], [address], [place], [place] [address]   Acetaminophen (Tylenol), Serum  08/10/2023 <10 (L)  10 - 30 ug/mL Final   Comment: (NOTE) Therapeutic concentrations vary significantly. A range of 10-30 ug/mL  may be an effective concentration for many patients. However, some  are best treated at concentrations outside of this range. Acetaminophen concentrations >150 ug/mL at 4 hours after ingestion  and >50 ug/mL at 12 hours after ingestion are often associated with  toxic reactions.  Performed at [facility], [address], [place], [place] [address]    WBC 08/10/2023 8.0  4.0 - 10.5 K/uL Final   RBC 08/10/2023 4.13 (L)  4.22 - 5.81 MIL/uL Final   Hemoglobin 08/10/2023 13.8  13.0 - 17.0 g/dL Final   HCT 34/74/2595 39.4  39.0 - 52.0 % Final   MCV 08/10/2023 95.4  80.0 - 100.0 fL Final   MCH 08/10/2023 33.4  26.0 - 34.0 pg Final   MCHC 08/10/2023 35.0  30.0 - 36.0 g/dL Final   RDW 63/87/5643 13.1  11.5 - 15.5 % Final   Platelets 08/10/2023 226  150 - 400 K/uL Final   nRBC 08/10/2023 0.0  0.0 - 0.2 % Final   Performed at [facility], [address], [place], [place] [address]   Opiates 08/10/2023 NONE DETECTED  NONE DETECTED Final   Cocaine 08/10/2023 POSITIVE (A)  NONE DETECTED Final   Benzodiazepines 08/10/2023 NONE DETECTED  NONE DETECTED Final   Amphetamines 08/10/2023 POSITIVE (A)  NONE DETECTED Final   Tetrahydrocannabinol 08/10/2023 POSITIVE (A)  NONE DETECTED Final   Barbiturates 08/10/2023 NONE DETECTED  NONE DETECTED Final   Comment: (NOTE) DRUG SCREEN FOR MEDICAL PURPOSES ONLY.  IF CONFIRMATION IS NEEDED FOR ANY PURPOSE, NOTIFY LAB WITHIN 5 DAYS.  LOWEST DETECTABLE LIMITS FOR URINE DRUG SCREEN Drug Class                     Cutoff (ng/mL) Amphetamine and metabolites    1000 Barbiturate and metabolites    200 Benzodiazepine                 200 Opiates and metabolites        300 Cocaine and metabolites        300 THC  50 Performed at [facility], [address], [place], [place] [address]    Total CK 08/10/2023 461 (H)  49 - 397 U/L Final   Performed at [facility], [address], [place], [place] [address]   Hepatitis B Surface Ag 08/11/2023 NON REACTIVE  NON REACTIVE Final   HCV Ab 08/11/2023 Reactive (A)  NON REACTIVE Final   Comment: (NOTE) The CDC recommends that a Reactive HCV antibody result be followed up  with a HCV Nucleic Acid Amplification test.     Hep A IgM 08/11/2023 NON REACTIVE  NON REACTIVE Final   Hep B C IgM 08/11/2023 NON REACTIVE  NON REACTIVE Final   Performed at [facility], [address], [place], [place] [address]   Total Protein 08/12/2023 7.4  6.5 - 8.1 g/dL Final   Albumin 91/47/8295 3.2 (L)  3.5 - 5.0 g/dL Final   AST 62/13/0865 381 (H)  15 - 41 U/L Final   ALT 08/12/2023 247 (H)  0 - 44 U/L Final   Alkaline Phosphatase 08/12/2023 97  38 - 126 U/L Final   Total Bilirubin 08/12/2023 1.0  <1.2 mg/dL Final   Bilirubin, Direct 08/12/2023 0.2  0.0 - 0.2 mg/dL Final   Indirect Bilirubin 08/12/2023 0.8  0.3 - 0.9 mg/dL Final   Performed at [facility], [address], [place], [place] [address]   Total CK 08/12/2023 41 (L)  49 - 397 U/L Final   Performed at [facility], [address], [place], [place] [address]   Ammonia 08/12/2023 48 (H)  9 - 35 umol/L Final   Performed at [facility], [address] [place], [place] [address]  Admission on 08/04/2023, Discharged on 08/07/2023  Component Date Value Ref Range Status   Amphetamines, Urine 08/04/2023 Negative  Cutoff=1000 ng/mL Final   Amphetamine test includes Amphetamine and Methamphetamine.   Barbiturate, Ur 08/04/2023 Negative  Cutoff=300 ng/mL Final   Benzodiazepine Quant, Ur 08/04/2023 Negative  Cutoff=300 ng/mL Final   Cannabinoid Quant, Ur 08/04/2023 Negative  Cutoff=50 ng/mL Final   Cocaine (Metab.) 08/04/2023 See Final Results  Cutoff=300 ng/mL Final   Opiate  Quant, Ur 08/04/2023 Negative  Cutoff=300 ng/mL Final   Opiate test includes Codeine and Morphine only.   Phencyclidine, Ur 08/04/2023 Negative  Cutoff=25 ng/mL Final   Methadone Screen, Urine 08/04/2023 Negative  Cutoff=300 ng/mL Final   Propoxyphene, Urine 08/04/2023 Negative  Cutoff=300 ng/mL Final   Ethanol U, Cloretta Ned 08/04/2023 See Final Results  Cutoff=0.020 % Final   Comment: (NOTE) Performed At: [facility] [address] [place], [place] [id number] Avis Epley PhD [id number]    WBC 08/04/2023 5.8  4.0 - 10.5 K/uL Final   RBC 08/04/2023 3.93 (L)  4.22 - 5.81 MIL/uL Final   Hemoglobin 08/04/2023 12.9 (L)  13.0 - 17.0 g/dL Final   HCT 95/63/8756 36.4 (L)  39.0 - 52.0 % Final   MCV 08/04/2023 92.6  80.0 - 100.0 fL Final   MCH 08/04/2023 32.8  26.0 - 34.0 pg Final   MCHC 08/04/2023 35.4  30.0 - 36.0 g/dL Final   RDW 43/32/9518 12.7  11.5 - 15.5 % Final   Platelets 08/04/2023 147 (L)  150 - 400 K/uL Final   Comment: SPECIMEN CHECKED FOR CLOTS REPEATED TO VERIFY PLATELET COUNT CONFIRMED BY SMEAR    nRBC 08/04/2023 0.0  0.0 - 0.2 % Final   Neutrophils Relative % 08/04/2023 76  %  Final   Neutro Abs 08/04/2023 4.4  1.7 - 7.7 K/uL Final   Lymphocytes Relative 08/04/2023 16  % Final   Lymphs Abs 08/04/2023 0.9  0.7 - 4.0 K/uL Final   Monocytes Relative 08/04/2023 6  % Final   Monocytes Absolute 08/04/2023 0.3  0.1 - 1.0 K/uL Final   Eosinophils Relative 08/04/2023 0  % Final   Eosinophils Absolute 08/04/2023 0.0  0.0 - 0.5 K/uL Final   Basophils Relative 08/04/2023 1  % Final   Basophils Absolute 08/04/2023 0.0  0.0 - 0.1 K/uL Final   Immature Granulocytes 08/04/2023 1  % Final   Abs Immature Granulocytes 08/04/2023 0.04  0.00 - 0.07 K/uL Final   Performed at [facility], [address], [place], [place] [address]   Sodium 08/04/2023 134 (L)  135 - 145 mmol/L Final   Potassium 08/04/2023 3.0 (L)  3.5 - 5.1 mmol/L Final   Chloride 08/04/2023 95 (L)  98 - 111 mmol/L  Final   CO2 08/04/2023 24  22 - 32 mmol/L Final   Glucose, Bld 08/04/2023 115 (H)  70 - 99 mg/dL Final   Glucose reference range applies only to samples taken after fasting for at least 8 hours.   BUN 08/04/2023 5 (L)  6 - 20 mg/dL Final   Creatinine, Ser 08/04/2023 0.56 (L)  0.61 - 1.24 mg/dL Final   Calcium 60/45/4098 8.2 (L)  8.9 - 10.3 mg/dL Final   Total Protein 11/91/4782 7.7  6.5 - 8.1 g/dL Final   Albumin 95/62/1308 3.6  3.5 - 5.0 g/dL Final   AST 65/78/4696 205 (H)  15 - 41 U/L Final   ALT 08/04/2023 99 (H)  0 - 44 U/L Final   Alkaline Phosphatase 08/04/2023 98  38 - 126 U/L Final   Total Bilirubin 08/04/2023 1.0  0.3 - 1.2 mg/dL Final   GFR, Estimated 08/04/2023 >60  >60 mL/min Final   Comment: (NOTE) Calculated using the CKD-EPI Creatinine Equation (2021)    Anion gap 08/04/2023 15  5 - 15 Final   Performed at [facility], [address] [place], [place] [address]   Hgb A1c MFr Bld 08/04/2023 5.4  4.8 - 5.6 % Final   Comment: (NOTE) Pre diabetes:          5.7%-6.4%  Diabetes:              >6.4%  Glycemic control for   <7.0% adults with diabetes    Mean Plasma Glucose 08/04/2023 108.28  mg/dL Final   Performed at [facility], [address] [place], [place] [address]   TSH 08/04/2023 0.613  0.350 - 4.500 uIU/mL Final   Comment: Performed by a 3rd Generation assay with a functional sensitivity of <=0.01 uIU/mL. Performed at [facility], [address] [place], [place] [address]    Neisseria Gonorrhea 08/04/2023 Negative   Final   Chlamydia 08/04/2023 Negative   Final   Comment 08/04/2023 Normal Reference Ranger Chlamydia - Negative   Final   Comment 08/04/2023 Normal Reference Range Neisseria Gonorrhea - Negative   Final   RPR Ser Ql 08/04/2023 NON REACTIVE  NON REACTIVE Final   Performed at [facility], [address] [place], [place] [address]   HIV-1 P24 Antigen - HIV24 08/04/2023 NON REACTIVE  NON REACTIVE Final   Comment:  (NOTE) Detection of p24 may be inhibited by biotin in the sample, causing false negative results in acute infection.  HIV 1/2 Antibodies 08/04/2023 NON REACTIVE  NON REACTIVE Final   Interpretation (HIV Ag Ab) 08/04/2023 A non reactive test result means that HIV 1 or HIV 2 antibodies and HIV 1 p24 antigen were not detected in the specimen.   Final   Performed at [facility], [address], [place], [place] [address]   Hepatitis B Surface Ag 08/04/2023 NON REACTIVE  NON REACTIVE Final   HCV Ab 08/04/2023 Reactive (A)  NON REACTIVE Final   Comment: (NOTE) The CDC recommends that a Reactive HCV antibody result be followed up  with a HCV Nucleic Acid Amplification test.     Hep A IgM 08/04/2023 NON REACTIVE  NON REACTIVE Final   Hep B C IgM 08/04/2023 NON REACTIVE  NON REACTIVE Final   Performed at [facility], [address], [place], [place] [address]   POC Amphetamine UR 08/04/2023 None Detected  NONE DETECTED (Cut Off Level 1000 ng/mL) Final   POC Secobarbital (BAR) 08/04/2023 None Detected  NONE DETECTED (Cut Off Level 300 ng/mL) Final   POC Buprenorphine (BUP) 08/04/2023 None Detected  NONE DETECTED (Cut Off Level 10 ng/mL) Final   POC Oxazepam (BZO) 08/04/2023 None Detected  NONE DETECTED (Cut Off Level 300 ng/mL) Final   POC Cocaine UR 08/04/2023 Positive (A)  NONE DETECTED (Cut Off Level 300 ng/mL) Final   POC Methamphetamine UR 08/04/2023 None Detected  NONE DETECTED (Cut Off Level 1000 ng/mL) Final   POC Morphine 08/04/2023 None Detected  NONE DETECTED (Cut Off Level 300 ng/mL) Final   POC Methadone UR 08/04/2023 None Detected  NONE DETECTED (Cut Off Level 300 ng/mL) Final   POC Oxycodone UR 08/04/2023 None Detected  NONE DETECTED (Cut Off Level 100 ng/mL) Final   POC Marijuana UR 08/04/2023 Positive (A)  NONE DETECTED (Cut Off Level 50 ng/mL) Final   Cocaine Metab Quant, Ur 08/04/2023 Positive (A)  Cutoff=300 Final   Benzoylecgonine GC/MS Conf 08/04/2023 35,080   Cutoff=150 ng/mL Final   Comment: (NOTE) Performed At: [facility] [address] [place], [place] [id number] Avis Epley PhD [id number]    Ethanol, Urine 08/04/2023 FID) (A)  Cutoff=0.020 Final   Comment: Positive Confirmation performed by Gas Chromatography (GC    Ethanol, Ur - Confirmation 08/04/2023 0.099  Cutoff=0.020 % Final   Comment: (NOTE) GLUCOSE NEGATIVE Performed At: [facility] [address] [place], [place] [id number] Avis Epley PhD [id number]   Admission on 06/15/2023, Discharged on 06/16/2023  Component Date Value Ref Range Status   WBC 06/15/2023 5.8  4.0 - 10.5 K/uL Final   RBC 06/15/2023 4.61  4.22 - 5.81 MIL/uL Final   Hemoglobin 06/15/2023 15.5  13.0 - 17.0 g/dL Final   HCT 72/53/6644 46.0  39.0 - 52.0 % Final   MCV 06/15/2023 99.8  80.0 - 100.0 fL Final   MCH 06/15/2023 33.6  26.0 - 34.0 pg Final   MCHC 06/15/2023 33.7  30.0 - 36.0 g/dL Final   RDW 03/47/4259 13.3  11.5 - 15.5 % Final   Platelets 06/15/2023 244  150 - 400 K/uL Final   nRBC 06/15/2023 0.0  0.0 - 0.2 % Final   Neutrophils Relative % 06/15/2023 56  % Final   Neutro Abs 06/15/2023 3.3  1.7 - 7.7 K/uL Final   Lymphocytes Relative 06/15/2023 33  % Final   Lymphs Abs 06/15/2023 1.9  0.7 - 4.0 K/uL Final   Monocytes Relative 06/15/2023 6  % Final   Monocytes  Absolute 06/15/2023 0.4  0.1 - 1.0 K/uL Final   Eosinophils Relative 06/15/2023 2  % Final   Eosinophils Absolute 06/15/2023 0.1  0.0 - 0.5 K/uL Final   Basophils Relative 06/15/2023 3  % Final   Basophils Absolute 06/15/2023 0.2 (H)  0.0 - 0.1 K/uL Final   Immature Granulocytes 06/15/2023 0  % Final   Abs Immature Granulocytes 06/15/2023 0.01  0.00 - 0.07 K/uL Final   Performed at [facility], [address], [place], [place] [address]   Sodium 06/15/2023 140  135 - 145 mmol/L Final   Potassium 06/15/2023 3.8  3.5 - 5.1 mmol/L Final   Chloride 06/15/2023 103  98 - 111 mmol/L Final   CO2 06/15/2023  28  22 - 32 mmol/L Final   Glucose, Bld 06/15/2023 108 (H)  70 - 99 mg/dL Final   Glucose reference range applies only to samples taken after fasting for at least 8 hours.   BUN 06/15/2023 5 (L)  6 - 20 mg/dL Final   Creatinine, Ser 06/15/2023 0.65  0.61 - 1.24 mg/dL Final   Calcium 21/30/8657 8.3 (L)  8.9 - 10.3 mg/dL Final   Total Protein 84/69/6295 8.6 (H)  6.5 - 8.1 g/dL Final   Albumin 28/41/3244 3.6  3.5 - 5.0 g/dL Final   AST 10/05/7251 296 (H)  15 - 41 U/L Final   ALT 06/15/2023 205 (H)  0 - 44 U/L Final   Alkaline Phosphatase 06/15/2023 145 (H)  38 - 126 U/L Final   Total Bilirubin 06/15/2023 0.4  0.3 - 1.2 mg/dL Final   GFR, Estimated 06/15/2023 >60  >60 mL/min Final   Comment: (NOTE) Calculated using the CKD-EPI Creatinine Equation (2021)    Anion gap 06/15/2023 9  5 - 15 Final   Performed at [facility], [address], [place], [place] [address]   Lipase 06/15/2023 27  11 - 51 U/L Final   Performed at [facility], [address] [place], [place] [address]   Specimen Source 06/16/2023 URINE, CLEAN CATCH   Final   Color, Urine 06/16/2023 YELLOW  YELLOW Final   APPearance 06/16/2023 HAZY (A)  CLEAR Final   Specific Gravity, Urine 06/16/2023 1.015  1.005 - 1.030 Final   pH 06/16/2023 6.0  5.0 - 8.0 Final   Glucose, UA 06/16/2023 NEGATIVE  NEGATIVE mg/dL Final   Hgb urine dipstick 06/16/2023 NEGATIVE  NEGATIVE Final   Bilirubin Urine 06/16/2023 NEGATIVE  NEGATIVE Final   Ketones, ur 06/16/2023 NEGATIVE  NEGATIVE mg/dL Final   Protein, ur 59/56/3875 NEGATIVE  NEGATIVE mg/dL Final   Nitrite 64/33/2951 NEGATIVE  NEGATIVE Final   Leukocytes,Ua 06/16/2023 NEGATIVE  NEGATIVE Final   RBC / HPF 06/16/2023 0-5  0 - 5 RBC/hpf Final   WBC, UA 06/16/2023 0-5  0 - 5 WBC/hpf Final   Comment:        Reflex urine culture not performed if WBC <=10, OR if Squamous epithelial cells >5. If Squamous epithelial cells >5 suggest recollection.     Bacteria, UA 06/16/2023 NONE SEEN  NONE SEEN Final   Squamous Epithelial / HPF 06/16/2023 0-5  0 - 5 /HPF Final   Mucus 06/16/2023 PRESENT   Final   Performed at [facility], [address] [place], [place] [address]   Alcohol, Ethyl (B) 06/15/2023 288 (H)  <10 mg/dL Final   Comment: (NOTE) Lowest detectable limit for serum alcohol is 10 mg/dL.  For medical purposes only. Performed  at [facility], [address] [place], [place] [address]    Salicylate Lvl 06/15/2023 <7.0 (L)  7.0 - 30.0 mg/dL Final   Performed at [facility], [address], [place], [place] [address]   Acetaminophen (Tylenol), Serum 06/15/2023 <10 (L)  10 - 30 ug/mL Final   Comment: (NOTE) Therapeutic concentrations vary significantly. A range of 10-30 ug/mL  may be an effective concentration for many patients. However, some  are best treated at concentrations outside of this range. Acetaminophen concentrations >150 ug/mL at 4 hours after ingestion  and >50 ug/mL at 12 hours after ingestion are often associated with  toxic reactions.  Performed at [facility], [address], [place], [place] [address]    Opiates 06/16/2023 NONE DETECTED  NONE DETECTED Final   Cocaine 06/16/2023 POSITIVE (A)  NONE DETECTED Final   Benzodiazepines 06/16/2023 NONE DETECTED  NONE DETECTED Final   Amphetamines 06/16/2023 NONE DETECTED  NONE DETECTED Final   Tetrahydrocannabinol 06/16/2023 POSITIVE (A)  NONE DETECTED Final   Barbiturates 06/16/2023 NONE DETECTED  NONE DETECTED Final   Comment: (NOTE) DRUG SCREEN FOR MEDICAL PURPOSES ONLY.  IF CONFIRMATION IS NEEDED FOR ANY PURPOSE, NOTIFY LAB WITHIN 5 DAYS.  LOWEST DETECTABLE LIMITS FOR URINE DRUG SCREEN Drug Class                     Cutoff (ng/mL) Amphetamine and metabolites    1000 Barbiturate and metabolites    200 Benzodiazepine                 200 Opiates and metabolites         300 Cocaine and metabolites        300 THC                            50 Performed at [facility], [address] [place], [place] [address]   Admission on 06/09/2023, Discharged on 06/10/2023  Component Date Value Ref Range Status   Lipase 06/09/2023 38  11 - 51 U/L Final   Performed at [facility], [address] [place], [place] [address]   Sodium 06/09/2023 137  135 - 145 mmol/L Final   Potassium 06/09/2023 3.6  3.5 - 5.1 mmol/L Final   Chloride 06/09/2023 100  98 - 111 mmol/L Final   CO2 06/09/2023 25  22 - 32 mmol/L Final   Glucose, Bld 06/09/2023 140 (H)  70 - 99 mg/dL Final   Glucose reference range applies only to samples taken after fasting for at least 8 hours.   BUN 06/09/2023 14  6 - 20 mg/dL Final   Creatinine, Ser 06/09/2023 0.75  0.61 - 1.24 mg/dL Final   Calcium 24/40/1027 8.6 (L)  8.9 - 10.3 mg/dL Final   Total Protein 25/36/6440 8.4 (H)  6.5 - 8.1 g/dL Final   Albumin 34/74/2595 3.6  3.5 - 5.0 g/dL Final   AST 63/87/5643 222 (H)  15 - 41 U/L Final   ALT 06/09/2023 176 (H)  0 - 44 U/L Final   Alkaline Phosphatase 06/09/2023 183 (H)  38 - 126 U/L Final   Total Bilirubin 06/09/2023 0.7  0.3 - 1.2 mg/dL Final   GFR, Estimated 06/09/2023 >60  >60 mL/min Final   Comment: (NOTE) Calculated using the CKD-EPI Creatinine Equation (2021)    Anion gap 06/09/2023 12  5 - 15 Final   Performed  at [facility], [address], [place], [place] [address]   WBC 06/09/2023 10.3  4.0 - 10.5 K/uL Final   RBC 06/09/2023 4.30  4.22 - 5.81 MIL/uL Final   Hemoglobin 06/09/2023 14.7  13.0 - 17.0 g/dL Final   HCT 60/45/4098 42.1  39.0 - 52.0 % Final   MCV 06/09/2023 97.9  80.0 - 100.0 fL Final   MCH 06/09/2023 34.2 (H)  26.0 - 34.0 pg Final   MCHC 06/09/2023 34.9  30.0 - 36.0 g/dL Final   RDW 11/91/4782 12.8  11.5 - 15.5 % Final   Platelets 06/09/2023 315  150 - 400 K/uL Final   nRBC 06/09/2023 0.0  0.0 - 0.2 % Final    Performed at [facility], [address] [place], [place] [address]   Color, Urine 06/10/2023 YELLOW  YELLOW Final   APPearance 06/10/2023 CLEAR  CLEAR Final   Specific Gravity, Urine 06/10/2023 1.010  1.005 - 1.030 Final   pH 06/10/2023 5.0  5.0 - 8.0 Final   Glucose, UA 06/10/2023 NEGATIVE  NEGATIVE mg/dL Final   Hgb urine dipstick 06/10/2023 NEGATIVE  NEGATIVE Final   Bilirubin Urine 06/10/2023 NEGATIVE  NEGATIVE Final   Ketones, ur 06/10/2023 NEGATIVE  NEGATIVE mg/dL Final   Protein, ur 30/86/5784 NEGATIVE  NEGATIVE mg/dL Final   Nitrite 69/62/9528 NEGATIVE  NEGATIVE Final   Leukocytes,Ua 06/10/2023 NEGATIVE  NEGATIVE Final   Performed at [facility], [address], [place], [place] [address]  Admission on 06/09/2023, Discharged on 06/09/2023  Component Date Value Ref Range Status   Opiates 06/09/2023 NONE DETECTED  NONE DETECTED Final   Cocaine 06/09/2023 POSITIVE (A)  NONE DETECTED Final   Benzodiazepines 06/09/2023 NONE DETECTED  NONE DETECTED Final   Amphetamines 06/09/2023 NONE DETECTED  NONE DETECTED Final   Tetrahydrocannabinol 06/09/2023 POSITIVE (A)  NONE DETECTED Final   Barbiturates 06/09/2023 NONE DETECTED  NONE DETECTED Final   Comment: (NOTE) DRUG SCREEN FOR MEDICAL PURPOSES ONLY.  IF CONFIRMATION IS NEEDED FOR ANY PURPOSE, NOTIFY LAB WITHIN 5 DAYS.  LOWEST DETECTABLE LIMITS FOR URINE DRUG SCREEN Drug Class                     Cutoff (ng/mL) Amphetamine and metabolites    1000 Barbiturate and metabolites    200 Benzodiazepine                 200 Opiates and metabolites        300 Cocaine and metabolites        300 THC                            50 Performed at [facility], [address] [place], [place] [address]    Color, Urine 06/09/2023 YELLOW  YELLOW Final   APPearance 06/09/2023 CLEAR  CLEAR Final   Specific Gravity, Urine 06/09/2023 1.016  1.005 - 1.030 Final   pH 06/09/2023 8.0  5.0 -  8.0 Final   Glucose, UA 06/09/2023 NEGATIVE  NEGATIVE mg/dL Final   Hgb urine dipstick 06/09/2023 NEGATIVE  NEGATIVE Final   Bilirubin Urine 06/09/2023 NEGATIVE  NEGATIVE Final   Ketones, ur 06/09/2023 NEGATIVE  NEGATIVE mg/dL Final   Protein, ur 72/53/6644 NEGATIVE  NEGATIVE mg/dL Final   Nitrite 03/47/4259 NEGATIVE  NEGATIVE Final   Leukocytes,Ua 06/09/2023 NEGATIVE  NEGATIVE Final   Performed at [facility], [address] [place], [place] [address]  Admission on 06/03/2023, Discharged on 06/05/2023  Component Date Value Ref Range Status   WBC 06/03/2023 6.4  4.0 - 10.5 K/uL Final   RBC 06/03/2023 3.88 (L)  4.22 - 5.81 MIL/uL Final   Hemoglobin 06/03/2023 13.3  13.0 - 17.0 g/dL Final   HCT 86/57/8469 38.0 (L)  39.0 - 52.0 % Final   MCV 06/03/2023 97.9  80.0 - 100.0 fL Final   MCH 06/03/2023 34.3 (H)  26.0 - 34.0 pg Final   MCHC 06/03/2023 35.0  30.0 - 36.0 g/dL Final   RDW 62/95/2841 13.0  11.5 - 15.5 % Final   Platelets 06/03/2023 233  150 - 400 K/uL Final   nRBC 06/03/2023 0.0  0.0 - 0.2 % Final   Neutrophils Relative % 06/03/2023 49  % Final   Neutro Abs 06/03/2023 3.2  1.7 - 7.7 K/uL Final   Lymphocytes Relative 06/03/2023 40  % Final   Lymphs Abs 06/03/2023 2.6  0.7 - 4.0 K/uL Final   Monocytes Relative 06/03/2023 7  % Final   Monocytes Absolute 06/03/2023 0.4  0.1 - 1.0 K/uL Final   Eosinophils Relative 06/03/2023 2  % Final   Eosinophils Absolute 06/03/2023 0.1  0.0 - 0.5 K/uL Final   Basophils Relative 06/03/2023 2  % Final   Basophils Absolute 06/03/2023 0.1  0.0 - 0.1 K/uL Final   Immature Granulocytes 06/03/2023 0  % Final   Abs Immature Granulocytes 06/03/2023 0.02  0.00 - 0.07 K/uL Final   Performed at [facility], [address], [place], [place] [address]   Sodium 06/03/2023 140  135 - 145 mmol/L Final   Potassium 06/03/2023 3.4 (L)  3.5 - 5.1 mmol/L Final   Chloride 06/03/2023 104  98 - 111 mmol/L Final   CO2 06/03/2023 26  22 - 32  mmol/L Final   Glucose, Bld 06/03/2023 112 (H)  70 - 99 mg/dL Final   Glucose reference range applies only to samples taken after fasting for at least 8 hours.   BUN 06/03/2023 5 (L)  6 - 20 mg/dL Final   Creatinine, Ser 06/03/2023 1.04  0.61 - 1.24 mg/dL Final   Calcium 07/30/2535 8.3 (L)  8.9 - 10.3 mg/dL Final   Total Protein 64/40/3474 7.0  6.5 - 8.1 g/dL Final   Albumin 25/95/6387 3.2 (L)  3.5 - 5.0 g/dL Final   AST 56/43/3295 258 (H)  15 - 41 U/L Final   ALT 06/03/2023 186 (H)  0 - 44 U/L Final   Alkaline Phosphatase 06/03/2023 107  38 - 126 U/L Final   Total Bilirubin 06/03/2023 0.8  0.3 - 1.2 mg/dL Final   GFR, Estimated 06/03/2023 >60  >60 mL/min Final   Comment: (NOTE) Calculated using the CKD-EPI Creatinine Equation (2021)    Anion gap 06/03/2023 10  5 - 15 Final   Performed at [facility], [address] [place], [place] [address]   Alcohol, Ethyl (B) 06/03/2023 285 (H)  <10 mg/dL Final   Comment: (NOTE) Lowest detectable limit for serum alcohol is 10 mg/dL.  For medical purposes only. Performed at [facility], [address], [place], [place] [address]    POC Amphetamine UR 06/03/2023 None Detected  NONE DETECTED (Cut Off Level 1000 ng/mL) Final   POC Secobarbital (BAR) 06/03/2023 None Detected  NONE DETECTED (Cut Off Level 300 ng/mL) Final   POC Buprenorphine (BUP) 06/03/2023 None Detected  NONE DETECTED (Cut Off Level 10 ng/mL)  Final   POC Oxazepam (BZO) 06/03/2023 None Detected  NONE DETECTED (Cut Off Level 300 ng/mL) Final   POC Cocaine UR 06/03/2023 Positive (A)  NONE DETECTED (Cut Off Level 300 ng/mL) Final   POC Methamphetamine UR 06/03/2023 None Detected  NONE DETECTED (Cut Off Level 1000 ng/mL) Final   POC Morphine 06/03/2023 Positive (A)  NONE DETECTED (Cut Off Level 300 ng/mL) Final   POC Methadone UR 06/03/2023 None Detected  NONE DETECTED (Cut Off Level 300 ng/mL) Final   POC Oxycodone UR 06/03/2023 None Detected  NONE DETECTED (Cut Off Level  100 ng/mL) Final   POC Marijuana UR 06/03/2023 Positive (A)  NONE DETECTED (Cut Off Level 50 ng/mL) Final  Admission on 05/28/2023, Discharged on 05/28/2023  Component Date Value Ref Range Status   WBC 05/28/2023 11.2 (H)  4.0 - 10.5 K/uL Final   RBC 05/28/2023 4.18 (L)  4.22 - 5.81 MIL/uL Final   Hemoglobin 05/28/2023 14.3  13.0 - 17.0 g/dL Final   HCT 83/66/2947 41.5  39.0 - 52.0 % Final   MCV 05/28/2023 99.3  80.0 - 100.0 fL Final   MCH 05/28/2023 34.2 (H)  26.0 - 34.0 pg Final   MCHC 05/28/2023 34.5  30.0 - 36.0 g/dL Final   RDW 65/46/5035 13.2  11.5 - 15.5 % Final   Platelets 05/28/2023 244  150 - 400 K/uL Final   nRBC 05/28/2023 0.0  0.0 - 0.2 % Final   Neutrophils Relative % 05/28/2023 65  % Final   Neutro Abs 05/28/2023 7.3  1.7 - 7.7 K/uL Final   Lymphocytes Relative 05/28/2023 27  % Final   Lymphs Abs 05/28/2023 3.0  0.7 - 4.0 K/uL Final   Monocytes Relative 05/28/2023 6  % Final   Monocytes Absolute 05/28/2023 0.7  0.1 - 1.0 K/uL Final   Eosinophils Relative 05/28/2023 1  % Final   Eosinophils Absolute 05/28/2023 0.1  0.0 - 0.5 K/uL Final   Basophils Relative 05/28/2023 1  % Final   Basophils Absolute 05/28/2023 0.1  0.0 - 0.1 K/uL Final   Immature Granulocytes 05/28/2023 0  % Final   Abs Immature Granulocytes 05/28/2023 0.04  0.00 - 0.07 K/uL Final   Performed at [facility], [address] [place], [place] [address]   Sodium 05/28/2023 139  135 - 145 mmol/L Final   Potassium 05/28/2023 3.1 (L)  3.5 - 5.1 mmol/L Final   Chloride 05/28/2023 106  98 - 111 mmol/L Final   CO2 05/28/2023 22  22 - 32 mmol/L Final   Glucose, Bld 05/28/2023 100 (H)  70 - 99 mg/dL Final   Glucose reference range applies only to samples taken after fasting for at least 8 hours.   BUN 05/28/2023 6  6 - 20 mg/dL Final   Creatinine, Ser 05/28/2023 0.70  0.61 - 1.24 mg/dL Final   Calcium 12/75/1700 8.0 (L)  8.9 - 10.3 mg/dL Final   GFR, Estimated 05/28/2023 >60  >60 mL/min Final   Comment:  (NOTE) Calculated using the CKD-EPI Creatinine Equation (2021)    Anion gap 05/28/2023 11  5 - 15 Final   Performed at [facility], [address], [place], [place] [address]   Alcohol, Ethyl (B) 05/28/2023 279 (H)  <10 mg/dL Final   Comment: (NOTE) Lowest detectable limit for serum alcohol is 10 mg/dL.  For medical purposes only. Performed at [facility], [address] [place], [place] [address]    Opiates 05/28/2023 POSITIVE (A)  NONE DETECTED Final   Cocaine 05/28/2023 POSITIVE (A)  NONE DETECTED Final   Benzodiazepines 05/28/2023 NONE DETECTED  NONE DETECTED Final   Amphetamines 05/28/2023 POSITIVE (A)  NONE DETECTED Final   Tetrahydrocannabinol 05/28/2023 POSITIVE (A)  NONE DETECTED Final   Barbiturates 05/28/2023 NONE DETECTED  NONE DETECTED Final   Comment: (NOTE) DRUG SCREEN FOR MEDICAL PURPOSES ONLY.  IF CONFIRMATION IS NEEDED FOR ANY PURPOSE, NOTIFY LAB WITHIN 5 DAYS.  LOWEST DETECTABLE LIMITS FOR URINE DRUG SCREEN Drug Class                     Cutoff (ng/mL) Amphetamine and metabolites    1000 Barbiturate and metabolites    200 Benzodiazepine                 200 Opiates and metabolites        300 Cocaine and metabolites        300 THC                            50 Performed at [facility], [address], [place], [place] [address]   There may be more visits with results that are not included.    Blood Alcohol level:  Lab Results  Component Value Date   ETH <10 11/21/2023   ETH 144 (H) 08/10/2023    Metabolic Disorder Labs: Lab Results  Component Value Date   HGBA1C 5.9 (H) 11/25/2023   MPG 122.63 11/25/2023   MPG 108.28 08/04/2023   Lab Results  Component Value Date   PROLACTIN 31.4 (H) 11/16/2019   Lab Results  Component Value Date   CHOL 147 02/24/2023   TRIG 96 02/24/2023   HDL 42 02/24/2023   CHOLHDL 3.5 02/24/2023   VLDL 19 02/24/2023   LDLCALC 86 02/24/2023   LDLCALC 57 01/14/2023    Therapeutic Lab  Levels: No results found for: "LITHIUM" No results found for: "VALPROATE" No results found for: "CBMZ"  Physical Findings   AIMS    Flowsheet Row [facility]) [facility] (Discharged) [facility] Ad[facility] (Discharged[facility]  AIMS Total Score 0 0 0      AUDIT    Flowsheet Row [facility]) [facility] Ad[facility] (Discharged[facility] Ad[facility] (Discharged) from OP [facility]Discharged[facility]  Alcohol Use Disorder Identification Test Final Score (AUDIT) 26 26 13  37      PHQ2-9    Flowsheet Row ED from 11/21/2023 in [facility] ED from 06/03/2023 in [facility]  PHQ-2 Total Score 2 0  PHQ-9 Total Score 8 --      Flowsheet Row ED from 11/21/2023 in [facility] Most recent reading at 11/22/2023 12:50 AM ED from 11/21/2023 in [facility] Most recent reading at 11/21/2023  7:45 PM ED from 11/21/2023 in [facility] Emergency Department at [facility] Most recent reading at 11/21/2023 11:06 AM  C-SSRS RISK CATEGORY No Risk High Risk No Risk        Psychiatric Specialty Exam: Physical Exam Constitutional:      Appearance: the patient  is not toxic-appearing.  Pulmonary:     Effort: Pulmonary effort is normal.  Neurological:     General: No focal deficit present.     Mental Status: the patient is alert and oriented to person, place, and time.   Review of Systems  Respiratory:  Negative for shortness of breath.   Cardiovascular:  Negative for chest  pain.  Gastrointestinal:  Negative for abdominal pain, constipation, diarrhea, nausea and vomiting.  Neurological:  Negative for headaches.      BP 130/70   Pulse 97   Temp 98.9 F (37.2 C) (Oral)   Resp 16   SpO2 99%   General Appearance: Fairly Groomed  Eye Contact:  Good  Speech:  Clear and Coherent  Volume:  Normal  Mood:  Euthymic  Affect:  Congruent  Thought Process:  Coherent  Orientation:  Full (Time, Place, and Person)  Thought Content: Logical   Suicidal Thoughts:  No  Homicidal Thoughts:  No  Memory:  Immediate;   Good  Judgement:  fair  Insight:  fair  Psychomotor Activity:  Normal  Concentration:  Concentration: Good  Recall:  Good  Fund of Knowledge: Good  Language: Good  Akathisia:  No  Handed:  not assessed  AIMS (if indicated): not done  Assets:  Communication Skills Desire for Improvement Leisure Time Physical Health  ADL's:  Intact  Cognition: WNL  Sleep:  Fair    Treatment Plan Summary: Daily contact with patient to assess and evaluate symptoms and progress in treatment and Medication management.  Polysubstance use - CIWA, Librium taper - Continue gabapentin at increased dose of 600 mg 3 times daily.  Previous provider made patient aware that gabapentin may not be accepted by residential rehab. - Continue Cymbalta 30 mg daily  Carlyn Reichert, MD 11/25/2023 2:00 PM

## 2023-11-25 NOTE — Group Note (Signed)
 Group Topic: Communication  Group Date: 11/25/2023 Start Time: 1030 End Time: 1100 Facilitators: Ninfa Linden, NT +3 MHT 2 Department: [facility]  Number of Participants: No one attended Group Group Focus: substance abuse education Treatment Modality:  Psychoeducation Interventions utilized were problem solving Purpose: improve communication skills  Name: Mark Hammond Date of Birth: 1991-08-13  MR: [id number]    Level of Participation: Patient did not attend group Quality of Participation: N/A Interactions with others: N/A Mood/Affect: N/A Triggers (if applicable): N/A Cognition: N/A Progress: N/A Response: N/A Plan: N/A  Patients Problems:  Patient Active Problem List   Diagnosis Date Noted   Polysubstance abuse (HCC) 11/21/2023   Alcohol use disorder 08/04/2023   Stimulant use disorder 08/04/2023   Opioid use disorder 08/04/2023   Tobacco use disorder 08/04/2023   Cannabis use disorder 08/04/2023   Hepatitis C 03/01/2023   Anxiety state 01/14/2023   Insomnia 01/14/2023   Schizoaffective disorder (HCC) 01/13/2023   Schizophrenia (HCC) 11/15/2019   Polysubstance (including opioids) dependence, daily use (HCC)    Substance induced mood disorder (HCC) 10/24/2019   Methamphetamine use disorder, 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

## 2023-11-26 DIAGNOSIS — F1010 Alcohol abuse, uncomplicated: Secondary | ICD-10-CM | POA: Diagnosis not present

## 2023-11-26 MED ORDER — HYDROXYZINE HCL 25 MG PO TABS: 25.0000 mg | ORAL_TABLET | Freq: Four times a day (QID) | ORAL | 0 refills | Status: DC | PRN

## 2023-11-26 MED ORDER — TRAZODONE HCL 50 MG PO TABS: 50.0000 mg | ORAL_TABLET | Freq: Every evening | ORAL | 0 refills | Status: DC | PRN

## 2023-11-26 MED ORDER — NICOTINE 7 MG/24HR TD PT24: 7.0000 mg | MEDICATED_PATCH | Freq: Every day | TRANSDERMAL | 0 refills | Status: DC

## 2023-11-26 MED ORDER — ACETAMINOPHEN 325 MG PO TABS: 650.0000 mg | ORAL_TABLET | Freq: Four times a day (QID) | ORAL | Status: DC | PRN

## 2023-11-26 MED ORDER — GABAPENTIN 300 MG PO CAPS: 600.0000 mg | ORAL_CAPSULE | Freq: Three times a day (TID) | ORAL | 0 refills | Status: DC

## 2023-11-26 MED ORDER — DULOXETINE HCL 30 MG PO CPEP: 30.0000 mg | ORAL_CAPSULE | Freq: Every day | ORAL | 0 refills | Status: DC

## 2023-11-26 NOTE — ED Notes (Signed)
 Patient observed/assessed in room in bed appearing in no immediate distress resting peacefully. Q15 minute checks continued by MHT and nursing staff. Will continue to monitor and support.

## 2023-11-26 NOTE — ED Notes (Signed)
 Patient resting with eyes closed in no apparent acute distress. Respirations even and unlabored. Environment secured. Safety checks in place according to facility policy.

## 2023-11-26 NOTE — ED Notes (Signed)
 Patient sitting in dayroom interacting with peers. Patient given meal, currently eating lunch. No acute distress noted. No concerns voiced. Informed patient to notify staff with any needs or assistance. Patient verbalized understanding or agreement. Safety checks in place per facility policy.

## 2023-11-26 NOTE — ED Notes (Signed)
 Pt isolates to room. Denies SI/HI/AVH. Denies c/o withdrawal symptoms. Voices c/o chronic pain. Ambulates with walker. No noted distress. Will continue to monitor for safety

## 2023-11-26 NOTE — ED Notes (Signed)
Pt observed/assessed in room sleeping. RR even and unlabored, appearing in no noted distress. Environmental check complete, will continue to monitor for safety 

## 2023-11-26 NOTE — Group Note (Signed)
 Group Topic: Recovery Basics  Group Date: 11/26/2023 Start Time: 1610 End Time: 1620 Facilitators: Paige Monarrez, Jacklynn Barnacle, RN  Department: [facility]  Number of Participants: 3  Group Focus: safety plan Treatment Modality:  Individual Therapy Interventions utilized were assignment Purpose: Create a suicide safety plan to have for discharge  Name: Mark Hammond Date of Birth: 1991-02-05  MR: [id number]    Level of Participation: Did not attend Quality of Participation: Did not attend Interactions with others: Did not attend Mood/Affect: Did not attend Triggers (if applicable): None identified Cognition: Did not attend Progress: None Response: Did not attend Plan: patient will be encouraged to attend future groups  Patients Problems:  Patient Active Problem List   Diagnosis Date Noted   Polysubstance abuse (HCC) 11/21/2023   Alcohol use disorder 08/04/2023   Stimulant use disorder 08/04/2023   Opioid use disorder 08/04/2023   Tobacco use disorder 08/04/2023   Cannabis use disorder 08/04/2023   Hepatitis C 03/01/2023   Anxiety state 01/14/2023   Insomnia 01/14/2023   Schizoaffective disorder (HCC) 01/13/2023   Schizophrenia (HCC) 11/15/2019   Polysubstance (including opioids) dependence, daily use (HCC)    Substance induced mood disorder (HCC) 10/24/2019   Methamphetamine use disorder, 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

## 2023-11-26 NOTE — ED Provider Notes (Addendum)
 FBC/OBS ASAP Discharge Summary  Date and Time: 11/26/2023 2:40 PM  Name: Mark Hammond  MRN:  [id number]   Discharge Diagnoses:  Final diagnoses:  Alcohol abuse  H/O medication noncompliance    Subjective:  The patient is a 33 year old male with a history of polysubstance use and numerous previous psychiatric hospitalizations. He presented to the [facility] behavioral urgent care requesting help with alcohol, cocaine, and methamphetamine use. He reported passive suicidal thoughts at that time. He was admitted to the facility based crisis.  Stay Summary:  The patient exhibited some improvement in mood and affect, and consistently denied suicidal thoughts before discharge.  He is scheduled for discharge on 2/24 in the morning, to [facility] residential.  The patient denies auditory/visual hallucinations.  The patient reports good mood, appetite, and sleep. They deny suicidal and homicidal thoughts. The patient denies side effects from their medications.  Review of systems as below. The patient denies experiencing any withdrawal symptoms.   Total Time spent with patient: 20 minutes  Past Psychiatric History: as above Past Medical History: as above Family History: none Family Psychiatric History: none Social History: as above Tobacco Cessation:  A prescription for an FDA-approved tobacco cessation medication provided at discharge   Current Medications:  Current Facility-Administered Medications  Medication Dose Route Frequency Provider Last Rate Last Admin   acetaminophen (TYLENOL) tablet 650 mg  650 mg Oral Q6H PRN Sindy Guadeloupe, NP   650 mg at 11/26/23 0801   alum & mag hydroxide-simeth (MAALOX/MYLANTA) 200-200-20 MG/5ML suspension 30 mL  30 mL Oral Q4H PRN Sindy Guadeloupe, NP       dicyclomine (BENTYL) tablet 20 mg  20 mg Oral Q6H PRN Sindy Guadeloupe, NP   20 mg at 11/22/23 1507   DULoxetine (CYMBALTA) DR capsule 30 mg  30 mg Oral Daily Mariel Craft, MD   30 mg at 11/26/23 1020    gabapentin (NEURONTIN) capsule 600 mg  600 mg Oral TID Mariel Craft, MD   600 mg at 11/26/23 1020   hydrOXYzine (ATARAX) tablet 25 mg  25 mg Oral Q6H PRN Sindy Guadeloupe, NP   25 mg at 11/26/23 1314   loperamide (IMODIUM) capsule 2-4 mg  2-4 mg Oral PRN Sindy Guadeloupe, NP       magnesium hydroxide (MILK OF MAGNESIA) suspension 30 mL  30 mL Oral Daily PRN Sindy Guadeloupe, NP       methocarbamol (ROBAXIN) tablet 500 mg  500 mg Oral Q8H PRN Sindy Guadeloupe, NP   500 mg at 11/26/23 0543   multivitamin with minerals tablet 1 tablet  1 tablet Oral Daily Maryagnes Amos, FNP   1 tablet at 11/26/23 1020   naproxen (NAPROSYN) tablet 500 mg  500 mg Oral BID PRN Sindy Guadeloupe, NP   500 mg at 11/26/23 0544   nicotine (NICODERM CQ - dosed in mg/24 hr) patch 7 mg  7 mg Transdermal Daily Mariel Craft, MD   7 mg at 11/24/23 1136   OLANZapine (ZYPREXA) injection 10 mg  10 mg Intramuscular TID PRN Sindy Guadeloupe, NP       OLANZapine (ZYPREXA) injection 5 mg  5 mg Intramuscular TID PRN Sindy Guadeloupe, NP       OLANZapine zydis (ZYPREXA) disintegrating tablet 5 mg  5 mg Oral TID PRN Sindy Guadeloupe, NP       ondansetron (ZOFRAN-ODT) disintegrating tablet 4 mg  4 mg Oral Q6H PRN Sindy Guadeloupe, NP       thiamine (VITAMIN B1) injection 100  mg  100 mg Intramuscular Once Maryagnes Amos, FNP       traZODone (DESYREL) tablet 50 mg  50 mg Oral QHS PRN Onuoha, Chinwendu V, NP   50 mg at 11/25/23 2149   Current Outpatient Medications  Medication Sig Dispense Refill   acetaminophen (TYLENOL) 325 MG tablet Take 2 tablets (650 mg total) by mouth every 6 (six) hours as needed for mild pain (pain score 1-3).     [START ON 11/27/2023] DULoxetine (CYMBALTA) 30 MG capsule Take 1 capsule (30 mg total) by mouth daily. 30 capsule 0   gabapentin (NEURONTIN) 300 MG capsule Take 2 capsules (600 mg total) by mouth 3 (three) times daily. 180 capsule 0   hydrOXYzine (ATARAX) 25 MG tablet Take 1 tablet (25 mg total) by mouth every 6  (six) hours as needed for anxiety. 30 tablet 0   [START ON 11/27/2023] nicotine (NICODERM CQ - DOSED IN MG/24 HR) 7 mg/24hr patch Place 1 patch (7 mg total) onto the skin daily. 28 patch 0   traZODone (DESYREL) 50 MG tablet Take 1 tablet (50 mg total) by mouth at bedtime as needed for sleep. 30 tablet 0    PTA Medications:  Facility Ordered Medications  Medication   [COMPLETED] dexamethasone (DECADRON) injection 10 mg   [COMPLETED] ibuprofen (ADVIL) tablet 800 mg   acetaminophen (TYLENOL) tablet 650 mg   alum & mag hydroxide-simeth (MAALOX/MYLANTA) 200-200-20 MG/5ML suspension 30 mL   magnesium hydroxide (MILK OF MAGNESIA) suspension 30 mL   dicyclomine (BENTYL) tablet 20 mg   hydrOXYzine (ATARAX) tablet 25 mg   loperamide (IMODIUM) capsule 2-4 mg   methocarbamol (ROBAXIN) tablet 500 mg   naproxen (NAPROSYN) tablet 500 mg   ondansetron (ZOFRAN-ODT) disintegrating tablet 4 mg   OLANZapine zydis (ZYPREXA) disintegrating tablet 5 mg   OLANZapine (ZYPREXA) injection 5 mg   OLANZapine (ZYPREXA) injection 10 mg   thiamine (VITAMIN B1) injection 100 mg   multivitamin with minerals tablet 1 tablet   [EXPIRED] chlordiazePOXIDE (LIBRIUM) capsule 25 mg   [COMPLETED] chlordiazePOXIDE (LIBRIUM) capsule 25 mg   Followed by   [COMPLETED] chlordiazePOXIDE (LIBRIUM) capsule 25 mg   Followed by   [COMPLETED] chlordiazePOXIDE (LIBRIUM) capsule 25 mg   Followed by   [COMPLETED] chlordiazePOXIDE (LIBRIUM) capsule 25 mg   traZODone (DESYREL) tablet 50 mg   DULoxetine (CYMBALTA) DR capsule 30 mg   nicotine (NICODERM CQ - dosed in mg/24 hr) patch 7 mg   [COMPLETED] gabapentin (NEURONTIN) capsule 300 mg   gabapentin (NEURONTIN) capsule 600 mg   PTA Medications  Medication Sig   [START ON 11/27/2023] DULoxetine (CYMBALTA) 30 MG capsule Take 1 capsule (30 mg total) by mouth daily.   gabapentin (NEURONTIN) 300 MG capsule Take 2 capsules (600 mg total) by mouth 3 (three) times daily.   hydrOXYzine  (ATARAX) 25 MG tablet Take 1 tablet (25 mg total) by mouth every 6 (six) hours as needed for anxiety.   [START ON 11/27/2023] nicotine (NICODERM CQ - DOSED IN MG/24 HR) 7 mg/24hr patch Place 1 patch (7 mg total) onto the skin daily.   traZODone (DESYREL) 50 MG tablet Take 1 tablet (50 mg total) by mouth at bedtime as needed for sleep.   acetaminophen (TYLENOL) 325 MG tablet Take 2 tablets (650 mg total) by mouth every 6 (six) hours as needed for mild pain (pain score 1-3).       11/25/2023    1:57 PM 11/21/2023   10:43 PM 06/05/2023   10:14 AM  Depression screen PHQ 2/9  Decreased Interest 1 3 0  Down, Depressed, Hopeless 1 3 0  PHQ - 2 Score 2 6 0  Altered sleeping 1 2   Tired, decreased energy 1 2   Change in appetite 1 2   Feeling bad or failure about yourself  1 3   Trouble concentrating 1 1   Moving slowly or fidgety/restless 1 3   Suicidal thoughts 0 3   PHQ-9 Score 8 22     Flowsheet Row ED from 11/21/2023 in [facility] Most recent reading at 11/22/2023 12:50 AM ED from 11/21/2023 in [facility] Most recent reading at 11/21/2023  7:45 PM ED from 11/21/2023 in [facility] Emergency Department at [facility] Most recent reading at 11/21/2023 11:06 AM  C-SSRS RISK CATEGORY No Risk High Risk No Risk      Musculoskeletal  Strength & Muscle Tone: within normal limits Gait & Station: normal Patient leans: N/A  Psychiatric Specialty Exam  Presentation General Appearance: Appropriate for Environment  Eye Contact:Fair  Speech:Clear and Coherent  Speech Volume:Normal  Handedness:-- (not assessed)   Mood and Affect  Mood:Euthymic  Affect:Congruent   Thought Process  Thought Processes:Coherent; Linear  Descriptions of Associations:Intact  Orientation:Full (Time, Place and Person)  Thought Content:Logical    Hallucinations:Hallucinations: None  Ideas of Reference:None  Suicidal Thoughts:Suicidal  Thoughts: No  Homicidal Thoughts:Homicidal Thoughts: No   Sensorium  Memory:Immediate Fair; Recent Fair; Remote Fair  Judgment:Fair  Insight:Fair   Executive Functions  Concentration:Fair  Attention Span:Fair  Recall:Fair  Fund of Knowledge:Fair  Language:Fair   Psychomotor Activity  Psychomotor Activity:Psychomotor Activity: Normal   Assets  Assets:Communication Skills; Resilience   Sleep  Sleep:Sleep: Fair    Physical Exam Constitutional:      Appearance: the patient is not toxic-appearing.  Pulmonary:     Effort: Pulmonary effort is normal.  Neurological:     General: No focal deficit present.     Mental Status: the patient is alert and oriented to person, place, and time.   Review of Systems  Respiratory:  Negative for shortness of breath.   Cardiovascular:  Negative for chest pain.  Gastrointestinal:  Negative for abdominal pain, constipation, diarrhea, nausea and vomiting.  Neurological:  Negative for headaches.    BP 118/60 (BP Location: Left Arm)   Pulse 88   Temp 97.9 F (36.6 C) (Oral)   Resp 20   SpO2 98%   Demographic Factors:  None pertinent   Loss Factors: NA   Historical Factors: NA   Risk Reduction Factors:   Positive social support, Positive therapeutic relationship, and Positive coping skills or problem solving skills   Continued Clinical Symptoms:  Substance use    Cognitive Features That Contribute To Risk:  None     Suicide Risk:  Mild   Plan Of Care/Follow-up recommendations:  Activity as tolerated. Diet as recommended by PCP. Keep all scheduled follow-up appointments as recommended.   Patient is instructed to take all prescribed medications as recommended. Report any side effects or adverse reactions to your outpatient psychiatrist. Patient is instructed to abstain from alcohol and illegal drugs while on prescription medications. In the event of worsening symptoms, patient is instructed to call the crisis  hotline, 911, or go to the nearest emergency department for evaluation and treatment.     Patient is also instructed prior to discharge to: Take all medications as prescribed by mental healthcare provider. Report any adverse effects and or reactions  from the medicines to outpatient provider promptly. Patient has been instructed & cautioned: To not engage in alcohol and or illegal drug use while on prescription medicines. In the event of worsening symptoms,  patient is instructed to call the crisis hotline, 911 and or go to the nearest ED for appropriate evaluation and treatment of symptoms. To follow-up with primary care provider for other medical issues, concerns and or health care needs    Disposition: to [facility]   Carlyn Reichert, MD PGY-3

## 2023-11-26 NOTE — ED Notes (Signed)
 PRN Atarax given due to patient reports of anxiety. Medication administered with no complications. Environment secured, safety checks in place per facility policy.

## 2023-11-26 NOTE — ED Notes (Signed)
 Writer heard yell from nurses' station. Writer approached patient who was in room standing at bedside. Patient stated they were the one who had yelled. Writer inquired about pain level as patient was scoring an 8 on the FACES pain scale. Patient states they are in "a lot of pain" today rating the pain 10/10. Writer administered PRN Tylenol as other medications had already been given earlier this AM. Medication administered with no complications. Patient states they're having a "bad pain day today" and is using walker to ambulate, patient ambulation is slow and unsteady. Environment secured, safety checks in place per facility policy.

## 2023-11-26 NOTE — Discharge Instructions (Signed)
Patient is instructed prior to discharge to:  Take all medications as prescribed by his/her mental healthcare provider. Report any adverse effects and or reactions from the medicines to his/her outpatient provider promptly. Keep all scheduled appointments, to ensure that you are getting refills on time and to avoid any interruption in your medication.  If you are unable to keep an appointment call to reschedule.  Be sure to follow-up with resources and follow-up appointments provided.  Patient has been instructed & cautioned: To not engage in alcohol and or illegal drug use while on prescription medicines. In the event of worsening symptoms, patient is instructed to call the crisis hotline, 911 and or go to the nearest ED for appropriate evaluation and treatment of symptoms. To follow-up with his/her primary care provider for your other medical issues, concerns and or health care needs.    

## 2023-11-26 NOTE — ED Notes (Signed)
 Patient alert & oriented x4. Denies intent to harm self or others when asked. Denies A/VH. Patient reported pain rating 10/10 in leg, PRN Tylenol administered and pain reassessed, pain score 6/10. No acute distress noted. Scheduled and PRN medications administered with no complications. Support and encouragement provided. Routine safety checks conducted per facility protocol. Encouraged patient to notify staff if any thoughts of harm towards self or others arise. Patient verbalizes understanding and agreement.

## 2023-11-26 NOTE — ED Notes (Signed)
 PRN Naproxen given due to patient reports of generalized aches rating 6/10. Medication administered with no complications. Environment secured, safety checks in place per facility policy.

## 2023-11-27 ENCOUNTER — Other Ambulatory Visit: Payer: Self-pay

## 2023-11-27 ENCOUNTER — Encounter (HOSPITAL_COMMUNITY): Payer: Self-pay

## 2023-11-27 ENCOUNTER — Emergency Department (HOSPITAL_COMMUNITY)
Admission: EM | Admit: 2023-11-27 | Discharge: 2023-11-28 | Disposition: A | Discharge status: Home / Self Care | Payer: 59 | Attending: Emergency Medicine | Admitting: Emergency Medicine

## 2023-11-27 ENCOUNTER — Emergency Department (HOSPITAL_COMMUNITY): Payer: 59

## 2023-11-27 DIAGNOSIS — F1010 Alcohol abuse, uncomplicated: Secondary | ICD-10-CM | POA: Insufficient documentation

## 2023-11-27 DIAGNOSIS — D72829 Elevated white blood cell count, unspecified: Secondary | ICD-10-CM | POA: Insufficient documentation

## 2023-11-27 DIAGNOSIS — N50812 Left testicular pain: Secondary | ICD-10-CM | POA: Insufficient documentation

## 2023-11-27 DIAGNOSIS — M79605 Pain in left leg: Secondary | ICD-10-CM | POA: Diagnosis present

## 2023-11-27 DIAGNOSIS — R45851 Suicidal ideations: Secondary | ICD-10-CM | POA: Insufficient documentation

## 2023-11-27 DIAGNOSIS — Y906 Blood alcohol level of 120-199 mg/100 ml: Secondary | ICD-10-CM | POA: Insufficient documentation

## 2023-11-27 DIAGNOSIS — Z5900 Homelessness unspecified: Secondary | ICD-10-CM | POA: Insufficient documentation

## 2023-11-27 LAB — ACETAMINOPHEN LEVEL: Acetaminophen (Tylenol), Serum: 10 ug/mL (ref 10–30)

## 2023-11-27 LAB — CBC
HCT: 38.4 % — ABNORMAL LOW (ref 39.0–52.0)
Hemoglobin: 12.8 g/dL — ABNORMAL LOW (ref 13.0–17.0)
MCH: 31.1 pg (ref 26.0–34.0)
MCHC: 33.3 g/dL (ref 30.0–36.0)
MCV: 93.2 fL (ref 80.0–100.0)
Platelets: 375 10*3/uL (ref 150–400)
RBC: 4.12 MIL/uL — ABNORMAL LOW (ref 4.22–5.81)
RDW: 13.4 % (ref 11.5–15.5)
WBC: 13.5 10*3/uL — ABNORMAL HIGH (ref 4.0–10.5)
nRBC: 0 % (ref 0.0–0.2)

## 2023-11-27 LAB — ETHANOL: Alcohol, Ethyl (B): 127 mg/dL — ABNORMAL HIGH (ref <10)

## 2023-11-27 LAB — URINALYSIS, ROUTINE W REFLEX MICROSCOPIC
Bilirubin Urine: NEGATIVE
Glucose, UA: NEGATIVE mg/dL
Hgb urine dipstick: NEGATIVE
Ketones, ur: NEGATIVE mg/dL
Leukocytes,Ua: NEGATIVE
Nitrite: NEGATIVE
Protein, ur: NEGATIVE mg/dL
Specific Gravity, Urine: 1.02 (ref 1.005–1.030)
pH: 5 (ref 5.0–8.0)

## 2023-11-27 LAB — SALICYLATE LEVEL: Salicylate Lvl: <7 mg/dL — ABNORMAL LOW (ref 7.0–30.0)

## 2023-11-27 LAB — BASIC METABOLIC PANEL
Anion gap: 16 — ABNORMAL HIGH (ref 5–15)
BUN: 15 mg/dL (ref 6–20)
CO2: 21 mmol/L — ABNORMAL LOW (ref 22–32)
Calcium: 9.5 mg/dL (ref 8.9–10.3)
Chloride: 95 mmol/L — ABNORMAL LOW (ref 98–111)
Creatinine, Ser: 0.86 mg/dL (ref 0.61–1.24)
GFR, Estimated: >60 mL/min (ref >60)
Glucose, Bld: 113 mg/dL — ABNORMAL HIGH (ref 70–99)
Potassium: 4.3 mmol/L (ref 3.5–5.1)
Sodium: 132 mmol/L — ABNORMAL LOW (ref 135–145)

## 2023-11-27 LAB — RAPID URINE DRUG SCREEN, HOSP PERFORMED
Amphetamines: NOT DETECTED
Barbiturates: NOT DETECTED
Benzodiazepines: POSITIVE — AB
Cocaine: POSITIVE — AB
Opiates: POSITIVE — AB
Tetrahydrocannabinol: POSITIVE — AB

## 2023-11-27 MED ORDER — KETOROLAC TROMETHAMINE 15 MG/ML IJ SOLN
15.0000 mg | Freq: Once | INTRAMUSCULAR | Status: AC
  Administered 2023-11-28: 15 mg via INTRAMUSCULAR
  Filled 2023-11-27: qty 1

## 2023-11-27 MED ORDER — METHOCARBAMOL 500 MG PO TABS
500.0000 mg | ORAL_TABLET | Freq: Once | ORAL | Status: AC
  Administered 2023-11-28: 500 mg via ORAL
  Filled 2023-11-27: qty 1

## 2023-11-27 NOTE — ED Notes (Addendum)
 Pt pending discharge to [facility].  A&O x 4.  Pending transport via [employer].

## 2023-11-27 NOTE — ED Notes (Signed)
 [employer] at bedside to transport to to [facility].

## 2023-11-27 NOTE — [facility] Counselor (Signed)
 TTS Note:   Date: 11/27/2023 Time: 21:03  Patient was deferred to [facility] for telehealth services. The intake coordinator, Leotis Shames, [phone], will provide updates regarding the availability of the [facility] provider to initiate the telehealth session.   The patient's care team has been informed and provided with necessary updates. Should any questions arise regarding the telepsych services with [facility], or if further updates or concerns need to be addressed, please contact the intake coordinator for assistance.

## 2023-11-27 NOTE — ED Provider Notes (Incomplete)
 [place] EMERGENCY DEPARTMENT AT [facility] Provider Note   CSN: [id number] Arrival date & time: 11/27/23  2017     History {Add pertinent medical, surgical, social history, OB history to HPI:1} Chief Complaint  Patient presents with  . Suicidal  . Leg Pain  . Testicle Pain    Mark Hammond is a 33 y.o. male.  Patient presents to the emergency department voluntarily via EMS complaining of suicidal ideations without plan, left leg pain for approximately 1 month, left-sided testicular pain for 2 days, alcohol use earlier today.  Patient has past medical history significant for major depressive disorder, methamphetamine use disorder, alcohol use disorder, substance-induced mood disorder, polysubstance dependence, schizophrenia, schizoaffective disorder, bipolar 1 disorder.  He states he is feeling suicidal because he continues to have pain in his left leg.  His left leg has been evaluated multiple times in the emergency department.  He has had previous relief with Robaxin.  He denies any known injury to the area.  He does endorse some left-sided low back pain which he states began after the leg pain began.  He denies any IV drug use over the past year.  The patient had a negative MRI of the lumbar spine on February 16.  As to the patient's testicular pain he states this began suddenly.  He states it feels as if something is squeezing his left chest.  He denies any swelling.  He denies any penile discharge.  He denies any concern for STIs at this time and declines testing.  The patient is ambulatory upon arrival.  He denies abdominal pain, nausea or vomiting, fever, urinary symptoms   Leg Pain Testicle Pain       Home Medications Prior to Admission medications   Medication Sig Start Date End Date Taking? Authorizing Provider  acetaminophen (TYLENOL) 325 MG tablet Take 2 tablets (650 mg total) by mouth every 6 (six) hours as needed for mild pain (pain score 1-3). 11/26/23    Mark Reichert, MD  DULoxetine (CYMBALTA) 30 MG capsule Take 1 capsule (30 mg total) by mouth daily. 11/27/23   Mark Reichert, MD  gabapentin (NEURONTIN) 300 MG capsule Take 2 capsules (600 mg total) by mouth 3 (three) times daily. 11/26/23   Mark Reichert, MD  hydrOXYzine (ATARAX) 25 MG tablet Take 1 tablet (25 mg total) by mouth every 6 (six) hours as needed for anxiety. 11/26/23   Mark Reichert, MD  nicotine (NICODERM CQ - DOSED IN MG/24 HR) 7 mg/24hr patch Place 1 patch (7 mg total) onto the skin daily. 11/27/23   Mark Reichert, MD  traZODone (DESYREL) 50 MG tablet Take 1 tablet (50 mg total) by mouth at bedtime as needed for sleep. 11/26/23   Mark Reichert, MD      Allergies    Codeine    Review of Systems   Review of Systems  Genitourinary:  Positive for testicular pain.    Physical Exam Updated Vital Signs BP 107/65   Pulse (!) 110   Temp 98.9 F (37.2 C)   Resp 18   Ht 5\' 4"  (1.626 m)   Wt 61.7 kg   SpO2 95%   BMI 23.34 kg/m  Physical Exam Vitals and nursing note reviewed.  Constitutional:      General: He is not in acute distress.    Appearance: He is well-developed.  HENT:     Head: Normocephalic and atraumatic.  Eyes:     Conjunctiva/sclera: Conjunctivae normal.  Cardiovascular:     Rate  and Rhythm: Normal rate and regular rhythm.     Heart sounds: No murmur heard. Pulmonary:     Effort: Pulmonary effort is normal. No respiratory distress.     Breath sounds: Normal breath sounds.  Abdominal:     Palpations: Abdomen is soft.     Tenderness: There is no abdominal tenderness.  Genitourinary:    Comments: Deferred Musculoskeletal:        General: Tenderness present. No swelling, deformity or signs of injury.     Cervical back: Neck supple.     Comments: Abductor strength 4/5 bilaterally, point tenderness over the left sided IT band. No tenderness to palpation of the spine, palpation of low back  Skin:    General: Skin is warm and dry.     Capillary  Refill: Capillary refill takes less than 2 seconds.  Neurological:     Mental Status: He is alert and oriented to person, place, and time.     ED Results / Procedures / Treatments   Labs (all labs ordered are listed, but only abnormal results are displayed) Labs Reviewed  CBC - Abnormal; Notable for the following components:      Result Value   WBC 13.5 (*)    RBC 4.12 (*)    Hemoglobin 12.8 (*)    HCT 38.4 (*)    All other components within normal limits  BASIC METABOLIC PANEL - Abnormal; Notable for the following components:   Sodium 132 (*)    Chloride 95 (*)    CO2 21 (*)    Glucose, Bld 113 (*)    Anion gap 16 (*)    All other components within normal limits  SALICYLATE LEVEL - Abnormal; Notable for the following components:   Salicylate Lvl <7.0 (*)    All other components within normal limits  ETHANOL - Abnormal; Notable for the following components:   Alcohol, Ethyl (B) 127 (*)    All other components within normal limits  RAPID URINE DRUG SCREEN, HOSP PERFORMED - Abnormal; Notable for the following components:   Opiates POSITIVE (*)    Cocaine POSITIVE (*)    Benzodiazepines POSITIVE (*)    Tetrahydrocannabinol POSITIVE (*)    All other components within normal limits  URINALYSIS, ROUTINE W REFLEX MICROSCOPIC - Abnormal; Notable for the following components:   Color, Urine AMBER (*)    All other components within normal limits  ACETAMINOPHEN LEVEL  GC/CHLAMYDIA PROBE AMP ([place]) NOT AT [facility]    EKG None  Radiology No results found.  Procedures Procedures  {Document cardiac monitor, telemetry assessment procedure when appropriate:1}  Medications Ordered in ED Medications  methocarbamol (ROBAXIN) tablet 500 mg (has no administration in time range)  ketorolac (TORADOL) 15 MG/ML injection 15 mg (has no administration in time range)    ED Course/ Medical Decision Making/ A&P   {   Click here for ABCD2, HEART and other calculatorsREFRESH Note  before signing :1}                              Medical Decision Making Amount and/or Complexity of Data Reviewed Radiology: ordered.  Risk Prescription drug management.   ***  {Document critical care time when appropriate:1} {Document review of labs and clinical decision tools ie heart score, Chads2Vasc2 etc:1}  {Document your independent review of radiology images, and any outside records:1} {Document your discussion with family members, caretakers, and with consultants:1} {Document social determinants of health affecting  pt's care:1} {Document your decision making why or why not admission, treatments were needed:1} Final Clinical Impression(s) / ED Diagnoses Final diagnoses:  None    Rx / DC Orders ED Discharge Orders     None

## 2023-11-27 NOTE — ED Triage Notes (Signed)
 Per EMS  SI Left leg pain Ongoing for 1 month Appt with Neurologist tomorrow for f/u Left testicle pain Ongoing 2 days Alcohol use today

## 2023-11-27 NOTE — ED Provider Notes (Signed)
 [facility] AT [facility] Provider Note   CSN: [id number] Arrival date & time: 11/27/23  2017     History  Chief Complaint  Patient presents with   Suicidal   Leg Pain   Testicle Pain    Mark Hammond is a 33 y.o. male.  Patient presents to the emergency department voluntarily via EMS complaining of suicidal ideations without plan, left leg pain for approximately 1 month, left-sided testicular pain for 2 days, alcohol use earlier today.  Patient has past medical history significant for major depressive disorder, methamphetamine use disorder, alcohol use disorder, substance-induced mood disorder, polysubstance dependence, schizophrenia, schizoaffective disorder, bipolar 1 disorder.  He states he is feeling suicidal because he continues to have pain in his left leg.  His left leg has been evaluated multiple times in the emergency department.  He has had previous relief with Robaxin.  He denies any known injury to the area.  He does endorse some left-sided low back pain which he states began after the leg pain began.  He denies any IV drug use over the past year.  The patient had a negative MRI of the lumbar spine on February 16.  As to the patient's testicular pain he states this began suddenly.  He states it feels as if something is squeezing his left chest.  He denies any swelling.  He denies any penile discharge.  He denies any concern for STIs at this time and declines testing.  The patient is ambulatory upon arrival.  He denies abdominal pain, nausea or vomiting, fever, urinary symptoms   Leg Pain Testicle Pain       Home Medications Prior to Admission medications   Medication Sig Start Date End Date Taking? Authorizing Provider  acetaminophen (TYLENOL) 325 MG tablet Take 2 tablets (650 mg total) by mouth every 6 (six) hours as needed for mild pain (pain score 1-3). 11/26/23  Yes Carlyn Reichert, MD  DULoxetine (CYMBALTA) 30 MG capsule Take 1 capsule (30  mg total) by mouth daily. 11/27/23  Yes Carlyn Reichert, MD  gabapentin (NEURONTIN) 300 MG capsule Take 2 capsules (600 mg total) by mouth 3 (three) times daily. 11/26/23  Yes Carlyn Reichert, MD  hydrOXYzine (ATARAX) 25 MG tablet Take 1 tablet (25 mg total) by mouth every 6 (six) hours as needed for anxiety. 11/26/23  Yes Carlyn Reichert, MD  naproxen sodium (ALEVE) 220 MG tablet Take 220 mg by mouth.   Yes [provider]  nicotine (NICODERM CQ - DOSED IN MG/24 HR) 7 mg/24hr patch Place 1 patch (7 mg total) onto the skin daily. 11/27/23  Yes Carlyn Reichert, MD  traZODone (DESYREL) 50 MG tablet Take 1 tablet (50 mg total) by mouth at bedtime as needed for sleep. 11/26/23  Yes Carlyn Reichert, MD      Allergies    Codeine    Review of Systems   Review of Systems  Genitourinary:  Positive for testicular pain.    Physical Exam Updated Vital Signs BP 107/65   Pulse (!) 110   Temp 98.9 F (37.2 C)   Resp 18   Ht 5\' 4"  (1.626 m)   Wt 61.7 kg   SpO2 95%   BMI 23.34 kg/m  Physical Exam Vitals and nursing note reviewed.  Constitutional:      General: He is not in acute distress.    Appearance: He is well-developed.  HENT:     Head: Normocephalic and atraumatic.  Eyes:  Conjunctiva/sclera: Conjunctivae normal.  Cardiovascular:     Rate and Rhythm: Normal rate and regular rhythm.     Heart sounds: No murmur heard. Pulmonary:     Effort: Pulmonary effort is normal. No respiratory distress.     Breath sounds: Normal breath sounds.  Abdominal:     Palpations: Abdomen is soft.     Tenderness: There is no abdominal tenderness.  Genitourinary:    Comments: Deferred Musculoskeletal:        General: Tenderness present. No swelling, deformity or signs of injury.     Cervical back: Neck supple.     Comments: Abductor strength 4/5 bilaterally, point tenderness over the left sided IT band. No tenderness to palpation of the spine, palpation of low back  Skin:    General: Skin is  warm and dry.     Capillary Refill: Capillary refill takes less than 2 seconds.  Neurological:     Mental Status: He is alert and oriented to person, place, and time.     ED Results / Procedures / Treatments   Labs (all labs ordered are listed, but only abnormal results are displayed) Labs Reviewed  CBC - Abnormal; Notable for the following components:      Result Value   WBC 13.5 (*)    RBC 4.12 (*)    Hemoglobin 12.8 (*)    HCT 38.4 (*)    All other components within normal limits  BASIC METABOLIC PANEL - Abnormal; Notable for the following components:   Sodium 132 (*)    Chloride 95 (*)    CO2 21 (*)    Glucose, Bld 113 (*)    Anion gap 16 (*)    All other components within normal limits  SALICYLATE LEVEL - Abnormal; Notable for the following components:   Salicylate Lvl <7.0 (*)    All other components within normal limits  ETHANOL - Abnormal; Notable for the following components:   Alcohol, Ethyl (B) 127 (*)    All other components within normal limits  RAPID URINE DRUG SCREEN, HOSP PERFORMED - Abnormal; Notable for the following components:   Opiates POSITIVE (*)    Cocaine POSITIVE (*)    Benzodiazepines POSITIVE (*)    Tetrahydrocannabinol POSITIVE (*)    All other components within normal limits  URINALYSIS, ROUTINE W REFLEX MICROSCOPIC - Abnormal; Notable for the following components:   Color, Urine AMBER (*)    All other components within normal limits  ACETAMINOPHEN LEVEL  GC/CHLAMYDIA PROBE AMP ([place]) NOT AT [facility]    EKG None  Radiology DG Femur Min 2 Views Left Result Date: 11/28/2023 CLINICAL DATA:  Left mid thigh pain EXAM: LEFT FEMUR 2 VIEWS COMPARISON:  None Available. FINDINGS: There is no evidence of fracture or other focal bone lesions. Soft tissues are unremarkable. IMPRESSION: Negative. Electronically Signed   By: Charlett Nose M.D.   On: 11/28/2023 00:24   US SCROTUM W/DOPPLER Result Date: 11/28/2023 CLINICAL DATA:  Left testicle pain  EXAM: SCROTAL ULTRASOUND DOPPLER ULTRASOUND OF THE TESTICLES TECHNIQUE: Complete ultrasound examination of the testicles, epididymis, and other scrotal structures was performed. Color and spectral Doppler ultrasound were also utilized to evaluate blood flow to the testicles. COMPARISON:  04/26/2017 FINDINGS: Right testicle Measurements: 3.4 x 2.0 x 2.3 cm. No mass or microlithiasis visualized. Left testicle Measurements: 3.1 x 2.0 x 2.3 cm. No mass or microlithiasis visualized. Right epididymis:  Normal in size and appearance. Left epididymis:  Normal in size and appearance. Hydrocele:  Small  left hydrocele. Varicocele:  None visualized. Pulsed Doppler interrogation of both testes demonstrates normal low resistance arterial and venous waveforms bilaterally. IMPRESSION: No testicular abnormality or evidence of torsion. Small left hydrocele. Electronically Signed   By: Charlett Nose M.D.   On: 11/28/2023 00:17    Procedures Procedures    Medications Ordered in ED Medications  methocarbamol (ROBAXIN) tablet 500 mg (500 mg Oral Given 11/28/23 0027)  ketorolac (TORADOL) 15 MG/ML injection 15 mg (15 mg Intramuscular Given 11/28/23 0027)    ED Course/ Medical Decision Making/ A&P                                 Medical Decision Making Amount and/or Complexity of Data Reviewed Radiology: ordered.  Risk Prescription drug management.   This patient presents to the ED for concern of SI, testicle pain, leg pain, this involves an extensive number of treatment options, and is a complaint that carries with it a high risk of complications and morbidity.  The differential diagnosis includes epididymitis, orchitis, torsion, others for testicle pain.  Differential for leg pain includes IT band pain, fracture, dislocation, radiculopathy, others.   Co morbidities that complicate the patient evaluation  Major depressive disorder, substance abuse disorder   Additional history obtained:  Additional history  obtained from EMS External records from outside source obtained and reviewed including behavioral health notes from yesterday, patient discharged to [facility] this morning.  At that time patient had passive thoughts of SI but denied SI as of discharge this morning. MRI results of lumbar spine from earlier in February with no acute findings   Lab Tests:  I Ordered, and personally interpreted labs.  The pertinent results include: Ethanol 127, UDS positive for opiates, cocaine, benzodiazepines, THC.  Mild leukocytosis with a white count of 13,500   Imaging Studies ordered:  I ordered imaging studies including plain films of the left femur, scrotal ultrasound with Doppler for torsion rule out I independently visualized and interpreted imaging which showed no sign of torsion, epididymitis, other acute abnormality on ultrasound.  No acute findings on plain films of the femur I agree with the radiologist interpretation   Consultations Obtained:  I requested consultation with the telepsychiatry service   Problem List / ED Course / Critical interventions / Medication management   I ordered medication including Toradol for inflammation, Robaxin for pain Reevaluation of the patient after these medicines showed that the patient improved I have reviewed the patients home medicines and have made adjustments as needed   Social Determinants of Health:  Patient is homeless   Test / Admission - Considered:  No acute findings to explain patient's chronic leg pain or his new onset testicular pain, UA grossly unremarkable.  GC chlamydia probe pending.  I do not plan to start antibiotics at this time with no penile discharge, normal ultrasound of the testicles, and normal-appearing urine.  Unclear etiology of patient's testicle pain but no signs of torsion or epididymitis.  Will provide referral to urology.  Patient has weak abductors and tenderness along the left-sided IT band.  Question IT band pain  as cause of patient's symptoms.  Tenderness is pinpoint, not consistent with radiculopathy.  Plain films are unremarkable.  MRI of lumbar spine from 2 weeks ago was unremarkable showing no significant nerve impingement at that time.  Plan to have patient follow-up with orthopedics as needed.  Disposition pending recommendations of telepsychiatry service at this time.  Patient is here voluntarily and was cleared by psychiatry yesterday.  Do not feel patient meets IVC criteria at this current time.  Patient care discussed with telepsychiatry provider.  Patient cleared for outpatient follow-up.  Recommend resending IVC.  IVC rescinded.  Discussed plan with patient.  Patient to be discharged home with resource guide for local shelters.  Patient provided with crutches to assist with ambulation.  Prescription provided for ibuprofen and Robaxin.  Follow-up information provided for orthopedics and urology as needed.  Return precautions provided.         Final Clinical Impression(s) / ED Diagnoses Final diagnoses:  Suicidal ideation  Homeless  Left leg pain  Pain in left testicle    Rx / DC Orders ED Discharge Orders     None         Pamala Duffel 11/28/23 0249    Shon Baton, MD 11/28/23 [phone]

## 2023-11-27 NOTE — ED Provider Triage Note (Signed)
 Emergency Medicine Provider Triage Evaluation Note  Ash Mcelwain , a 33 y.o. male  was evaluated in triage.  Pt complains of SI, left testicle pain, left leg pain.  Patient reports that he is suicidal due to his left leg pain.  Reports left leg pain has been ongoing for quite some time.  Set to see neurology tomorrow for this.  Reports that he plans on cutting his wrists due to his pain.  Denies HI, AVH.  Also complaining of left testicle swelling.  Endorsing dysuria without penile discharge.  Denies concern for STI  Review of Systems  Positive:  Negative:   Physical Exam  BP 107/65   Pulse (!) 110   Temp 98.9 F (37.2 C)   Resp 18   Ht 5\' 4"  (1.626 m)   Wt 61.7 kg   SpO2 95%   BMI 23.34 kg/m  Gen:   Awake, no distress   Resp:  Normal effort  MSK:   Moves extremities without difficulty  Other:    Medical Decision Making  Medically screening exam initiated at 9:17 PM.  Appropriate orders placed.  Manas Hickling was informed that the remainder of the evaluation will be completed by another provider, this initial triage assessment does not replace that evaluation, and the importance of remaining in the ED until their evaluation is complete.  IVC report filled out   Clent Ridges 11/27/23 2118

## 2023-11-27 NOTE — ED Notes (Signed)
 Patient is sleeping. Respirations equal and unlabored, skin warm and dry. No change in assessment or acuity. Routine safety checks conducted according to facility protocol. Will continue to monitor for safety.

## 2023-11-28 DIAGNOSIS — F4321 Adjustment disorder with depressed mood: Secondary | ICD-10-CM

## 2023-11-28 DIAGNOSIS — M79605 Pain in left leg: Secondary | ICD-10-CM | POA: Diagnosis not present

## 2023-11-28 LAB — GC/CHLAMYDIA PROBE AMP (~~LOC~~) NOT AT ARMC
Chlamydia: NEGATIVE
Comment: NEGATIVE
Comment: NORMAL
Neisseria Gonorrhea: NEGATIVE

## 2023-11-28 NOTE — Progress Notes (Signed)
 Orthopedic Tech Progress Note Patient Details:  Mark Hammond 1991/03/07 [id number]  Ortho Devices Type of Ortho Device: Crutches Ortho Device/Splint Interventions: Ordered, Adjustment, Application  I taught the pt how to use the crutches then the pt got up and walked good with them. Post Interventions Patient Tolerated: Well Instructions Provided: Care of device, Adjustment of device  [name] Post 11/28/2023, 2:42 AM

## 2023-11-28 NOTE — Consult Note (Signed)
 [facility] Consult Note  Patient Name: Mark Hammond MRN: [id number] DOB: 17-Dec-1990 DATE OF Consult: 11/28/2023  PRIMARY PSYCHIATRIC DIAGNOSES Adjustment disorder with depressed mood; Homelessness; Polysubstance use disorder by present history  Based on my current evaluation and assessment of the patient, he is a 33 y.o. male with suicidal ideations with various plans in the context of acute stressor (frustration in aftermath of being turned away from residential dual diagnosis facility). However, since patient has been in the emergency department, has calmed and processed his thoughts and emotions, he denies suicidal and homicidal intent. Throughout observation in the emergency department, per primary team, the patient has been behaviorally appropriate, compliant with cares, and has not required emergent psychotropic medications or seclusion and/or restraint. The patient's presentation is consistent with Adjustment disorder with depressed mood; Homelessness; Polysubstance use disorder by present history. Therefore, patient does not meet criteria for an intensive inpatient psychiatric hospitalization.  RECOMMENDATIONS  Medication recommendations:  Risks, benefits, side effects and alternatives to treatments reviewed:  -Continue home psychotropic regimen (recommend performing a medication reconciliation with patient's pharmacy to ascertain accuracy of reported regimen): per chart documentation, trazodone 50 mg at bedtime as needed for insomnia, hydroxyzine 25 mg every 6 hours as needed for anxiety, duloxetine 30 mg daily for chronic pain, anxiety and depression  As needed medications to manage patient's acute symptoms while in hospital care: QTc is 478 ms as of 11/2023 -Maximize utilization of verbal de-escalation techniques, if attempts are unsuccessful and patient poses a threat to self and others: Consider olanzapine (Zyprexa) 2.5 mg to 5 mg PO/IM with diphenhydramine 25 mg to 50 mg PO/IM every  6 hours as needed for severe agitation. Would offer patient the option of taking PO medication first, but if patient refuses then may administer IM medication as a last resort. Would not exceed 20 mg of olanzapine within a 24-hour period. Avoid co-administering intramuscular olanzapine with intravenous benzodiazepine, as giving both medications concurrently is associated with respiratory depression.   Non-Medication recommendations:  -Recommend consultation with social services to help patient navigate resources for shelter and access to food and weather appropriate clothing -Recommend engagement in outpatient chemical dependency treatment -Recommend engagement in outpatient mental health services for psychotropic medication management and therapy  -Recommend regular follow-up with primary care provider; consider outpatient workup for mood dysregulation to include vitamin B12, thyroid and vitamin D studies to name a few -Safety planning with strict return precautions to the ED if patient is at imminent risk to self or others in the future   Observation recommendations:  If agitated, recommend 1:1 observation  Is inpatient psychiatric hospitalization recommended for this patient? No (Explain why): patient is not at imminent risk to self or others at this time  Follow-Up Telepsychiatry C/L services: We will sign off for now. Please re-consult our service if needed for any concerning changes in the patient's condition, discharge planning, or questions.  Communication: Treatment team members (and family members if applicable) who were involved in treatment/care discussions and planning, and with whom we spoke or engaged with via secure text/chat, include the following: primary provider and RN  Thank you for involving [place] in the care of this patient. If you have any additional questions or concerns, please call [phone] and ask for me or the provider on-call.  Total time spent in this encounter was  60 minutes with greater than 50% of time spent in counseling and coordination of care.   TELEPSYCHIATRY ATTESTATION & CONSENT  As the provider for this telehealth  consult, I attest that I verified the patient's identity using two separate identifiers, introduced myself to the patient, provided my credentials, disclosed my location, and performed this encounter via a HIPAA-compliant, real-time, face-to-face, two-way, interactive audio and video platform and with the full consent and agreement of the patient (or guardian as applicable.)  Patient physical location: [id number]/H011C. Telehealth provider physical location: home office in state of [place].  Video start time: 0015 (Central Time) Video end time: 0035 (Central Time)  IDENTIFYING DATA  Mark Hammond is a 33 y.o. year-old male for whom a psychiatric consultation has been ordered by the primary provider. The patient was identified using two separate identifiers.  CHIEF COMPLAINT/REASON FOR CONSULT  Suicidal thoughts   HISTORY OF PRESENT ILLNESS (HPI)  I evaluated the patient today face-to-face via secure, HIPAA-compliant telepsychiatric connection, and at the request of the primary treatment team. The reason for the telepsychiatric consultation is that the patient is a 33 year old male with a documented history of major depressive disorder, schizoaffective disorder, polysubstance use disorder who initially presented to [facility] voluntarily given passive suicidal ideations and hope to access medically assisted detox from alcohol, cocaine and methamphetamine. Patient was slated for transfer for [facility] residential for dual diagnosis treatment of underlying mental health and substance use concerns on 11/27/23, which did occur. However, patient returns to the ED on 11/27/23 about 12 hours later with suicidal ideations with multiple plans to strangle self with "sheets" or cut wrists. Primary team is seeking psychotropic medication recommendations, safety evaluation  to determine appropriateness for more intensive psychiatric services and diagnostic clarity as to the patient's presentation.   During one-on-one evaluation with this provider, patient was alert and oriented to self and generally to location and situation. The patient did not appear to be inappropriately internally preoccupied; patient's thought process was linear and organized. Patient asserted that when he presented to [facility] he was refused entry into programming given that he could not ambulate without an assistive device. He explained that this news was very difficult for patient to tolerate, especially in the context of his homelessness. Patient admitted that he misrepresented his housing situation to the [facility] team, explaining that he has been separated from his girlfriend for over a year. As such, they have not been living together. Instead, patient has been squatting in an abandoned house that does not have any utilities. He explained that he can have access to water from time to time so that he can shower, but this is not consistent access. He reported that he has difficulties accessing resources to meet his basic needs such as weather appropriate clothing and food. He asserted that he is exasperated with his chronic leg pain impairing his mobility and his homelessness with very little to no psychosocial supports. Patient explained that it is these circumstances that cause him to have suicidal ideations with various voiced plans. However, he is amenable to engage with social services to explore resources to meet his basic needs, of which his homelessness is his most pressing concern. Patient denies suicidal and homicidal intent; he is future oriented to work with primary team to have resources to meet his basic needs for shelter, food and appropriate clothing.   PAST PSYCHIATRIC HISTORY  Inpatient psychiatric treatment: per chart documentation, history of multiple previous inpatient  psychiatric hospitalizations  Outpatient mental health treatment: per patient, denies  Current home psychotropic medications: per chart documentation, trazodone 50 mg at bedtime as needed for insomnia, hydroxyzine 25 mg every 6 hours as  needed for anxiety, duloxetine 30 mg daily for chronic pain, anxiety and depression  Suicide attempts: per chart documentation, yes  Trauma history: patient did not assert current concerns for trauma or exploitation  Otherwise as per HPI above.  PAST MEDICAL HISTORY  Past Medical History:  Diagnosis Date   Alcoholic gastritis    Bipolar 1 disorder (HCC)    Dental abscess    Depression    Drug abuse (HCC)    ETOH abuse    Hallucination    Schizo affective schizophrenia [facility])      HOME MEDICATIONS  Facility Ordered Medications  Medication   [COMPLETED] methocarbamol (ROBAXIN) tablet 500 mg   [COMPLETED] ketorolac (TORADOL) 15 MG/ML injection 15 mg   PTA Medications  Medication Sig   DULoxetine (CYMBALTA) 30 MG capsule Take 1 capsule (30 mg total) by mouth daily.   gabapentin (NEURONTIN) 300 MG capsule Take 2 capsules (600 mg total) by mouth 3 (three) times daily.   hydrOXYzine (ATARAX) 25 MG tablet Take 1 tablet (25 mg total) by mouth every 6 (six) hours as needed for anxiety.   nicotine (NICODERM CQ - DOSED IN MG/24 HR) 7 mg/24hr patch Place 1 patch (7 mg total) onto the skin daily.   traZODone (DESYREL) 50 MG tablet Take 1 tablet (50 mg total) by mouth at bedtime as needed for sleep.   acetaminophen (TYLENOL) 325 MG tablet Take 2 tablets (650 mg total) by mouth every 6 (six) hours as needed for mild pain (pain score 1-3).    ALLERGIES  Allergies  Allergen Reactions   Codeine Other (See Comments)    Patient states parents told him he was allergic at a young age.    SOCIAL & SUBSTANCE USE HISTORY  Social History   Socioeconomic History   Marital status: Single    Spouse name: Not on file   Number of children: Not on file   Years of  education: Not on file   Highest education level: Not on file  Occupational History   Not on file  Tobacco Use   Smoking status: Every Day    Current packs/day: 1.00    Types: Cigarettes    Passive exposure: Current   Smokeless tobacco: Never  Vaping Use   Vaping status: Every Day   Substances: THC  Substance and Sexual Activity   Alcohol use: Yes    Comment: 4-5x 40oz beers daily   Drug use: Yes    Frequency: 7.0 times per week    Types: "Crack" cocaine, Heroin, MDMA (Ecstacy), Marijuana, Cocaine    Comment: last cocaine use 2/24   Sexual activity: Yes  Other Topics Concern   Not on file  Social History Narrative   Pt lives in [place] with ex-girlfriend.  Pt is not followed by an outpatient psychiatric provider.   Social Drivers of Corporate investment banker Strain: Not on file  Food Insecurity: Food Insecurity Present (11/22/2023)   Hunger Vital Sign    Worried About Running Out of Food in the Last Year: Often true    Ran Out of Food in the Last Year: Often true  Transportation Needs: Unmet Transportation Needs (11/22/2023)   PRAPARE - Administrator, Civil Service (Medical): Yes    Lack of Transportation (Non-Medical): Yes  Physical Activity: Not on file  Stress: Not on file  Social Connections: Not on file   Social History   Tobacco Use  Smoking Status Every Day   Current packs/day: 1.00   Types:  Cigarettes   Passive exposure: Current  Smokeless Tobacco Never   Social History   Substance and Sexual Activity  Alcohol Use Yes   Comment: 4-5x 40oz beers daily   Social History   Substance and Sexual Activity  Drug Use Yes   Frequency: 7.0 times per week   Types: "Crack" cocaine, Heroin, MDMA (Ecstacy), Marijuana, Cocaine   Comment: last cocaine use 2/24    FAMILY HISTORY  Family History  Problem Relation Age of Onset   Stroke Other    Family Psychiatric History (if known):  none disclosed   MENTAL STATUS EXAM (MSE)  Mental Status  Exam: General Appearance:  hospital gown  Orientation:  Full (Time, Place, and Person)  Memory:  Immediate;   Fair Recent;   Fair Remote;   Fair  Concentration:  Concentration: Fair and Attention Span: Fair  Recall:  Fair  Attention  Fair  Eye Contact:  Fair  Speech:   coherent  Language:  Fair  Volume:  Normal  Mood: "okay"  Affect:  Congruent  Thought Process:  Coherent  Thought Content:  Logical  Suicidal Thoughts:  passive suicidal thoughts   Homicidal Thoughts:  No  Judgement:  Fair  Insight:  Fair  Psychomotor Activity:   Fair  Akathisia:  No  Fund of Knowledge:  Fair    Assets:  Desire for Improvement  Cognition:  WNL  ADL's:  Impaired  AIMS (if indicated):       VITALS  Blood pressure 107/65, pulse (!) 110, temperature 98.9 F (37.2 C), resp. rate 18, height 5\' 4"  (1.626 m), weight 61.7 kg, SpO2 95%.  LABS  Admission on 11/27/2023  Component Date Value Ref Range Status   WBC 11/27/2023 13.5 (H)  4.0 - 10.5 K/uL Final   RBC 11/27/2023 4.12 (L)  4.22 - 5.81 MIL/uL Final   Hemoglobin 11/27/2023 12.8 (L)  13.0 - 17.0 g/dL Final   HCT 03/47/4259 38.4 (L)  39.0 - 52.0 % Final   MCV 11/27/2023 93.2  80.0 - 100.0 fL Final   MCH 11/27/2023 31.1  26.0 - 34.0 pg Final   MCHC 11/27/2023 33.3  30.0 - 36.0 g/dL Final   RDW 56/38/7564 13.4  11.5 - 15.5 % Final   Platelets 11/27/2023 375  150 - 400 K/uL Final   nRBC 11/27/2023 0.0  0.0 - 0.2 % Final   Performed at [facility], [address], [place], [place] [address]   Sodium 11/27/2023 132 (L)  135 - 145 mmol/L Final   Potassium 11/27/2023 4.3  3.5 - 5.1 mmol/L Final   Chloride 11/27/2023 95 (L)  98 - 111 mmol/L Final   CO2 11/27/2023 21 (L)  22 - 32 mmol/L Final   Glucose, Bld 11/27/2023 113 (H)  70 - 99 mg/dL Final   Glucose reference range applies only to samples taken after fasting for at least 8 hours.   BUN 11/27/2023 15  6 - 20 mg/dL Final   Creatinine, Ser 11/27/2023 0.86  0.61 - 1.24 mg/dL Final    Calcium 18/84/1660 9.5  8.9 - 10.3 mg/dL Final   GFR, Estimated 11/27/2023 >60  >60 mL/min Final   Comment: (NOTE) Calculated using the CKD-EPI Creatinine Equation (2021)    Anion gap 11/27/2023 16 (H)  5 - 15 Final   Performed at [facility], [address], [place], [place] [address]   Salicylate Lvl 11/27/2023 <7.0 (L)  7.0 - 30.0 mg/dL Final   Performed at [facility], [address], [place], [place] [address]   Acetaminophen (Tylenol), Serum 11/27/2023 10  10 - 30 ug/mL Final   Comment: (NOTE) Therapeutic concentrations vary significantly. A range of 10-30 ug/mL  may be an effective concentration for many patients. However, some  are best treated at concentrations outside of this range. Acetaminophen concentrations >150 ug/mL at 4 hours after ingestion  and >50 ug/mL at 12 hours after ingestion are often associated with  toxic reactions.  Performed at [facility], [address], [place], [place] [address]    Alcohol, Ethyl (B) 11/27/2023 127 (H)  <10 mg/dL Final   Comment: (NOTE) Lowest detectable limit for serum alcohol is 10 mg/dL.  For medical purposes only. Performed at [facility], [address] [place], [place] [address]    Opiates 11/27/2023 POSITIVE (A)  NONE DETECTED Final   Cocaine 11/27/2023 POSITIVE (A)  NONE DETECTED Final   Benzodiazepines 11/27/2023 POSITIVE (A)  NONE DETECTED Final   Amphetamines 11/27/2023 NONE DETECTED  NONE DETECTED Final   Tetrahydrocannabinol 11/27/2023 POSITIVE (A)  NONE DETECTED Final   Barbiturates 11/27/2023 NONE DETECTED  NONE DETECTED Final   Comment: (NOTE) DRUG SCREEN FOR MEDICAL PURPOSES ONLY.  IF CONFIRMATION IS NEEDED FOR ANY PURPOSE, NOTIFY LAB WITHIN 5 DAYS.  LOWEST DETECTABLE LIMITS FOR URINE DRUG SCREEN Drug Class                     Cutoff (ng/mL) Amphetamine and metabolites    1000 Barbiturate and metabolites    200 Benzodiazepine                 200 Opiates and metabolites         300 Cocaine and metabolites        300 THC                            50 Performed at [facility] Lab, [address] [place], [place] [address]    Color, Urine 11/27/2023 AMBER (A)  YELLOW Final   BIOCHEMICALS MAY BE AFFECTED BY COLOR   APPearance 11/27/2023 CLEAR  CLEAR Final   Specific Gravity, Urine 11/27/2023 1.020  1.005 - 1.030 Final   pH 11/27/2023 5.0  5.0 - 8.0 Final   Glucose, UA 11/27/2023 NEGATIVE  NEGATIVE mg/dL Final   Hgb urine dipstick 11/27/2023 NEGATIVE  NEGATIVE Final   Bilirubin Urine 11/27/2023 NEGATIVE  NEGATIVE Final   Ketones, ur 11/27/2023 NEGATIVE  NEGATIVE mg/dL Final   Protein, ur 41/32/4401 NEGATIVE  NEGATIVE mg/dL Final   Nitrite 02/72/5366 NEGATIVE  NEGATIVE Final   Leukocytes,Ua 11/27/2023 NEGATIVE  NEGATIVE Final   Performed at [facility], [address] [place], [place] [address]    PSYCHIATRIC REVIEW OF SYSTEMS (ROS)  ROS: Notable for the following relevant positive findings: Review of Systems  Psychiatric/Behavioral:  Positive for depression, substance abuse and suicidal ideas. Negative for hallucinations and memory loss. The patient is nervous/anxious and has insomnia.     Additional findings:      Musculoskeletal: No abnormal movements observed      Gait & Station: Laying/Sitting      Pain Screening: Present - mild to moderate      Nutrition & Dental Concerns: none disclosed   RISK FORMULATION/ASSESSMENT  Is the patient experiencing any suicidal or homicidal ideations: Yes       Explain if yes: male with suicidal  ideations with various plans in the context of acute stressor (frustration in aftermath of being turned away from residential dual diagnosis facility). However, since patient has been in the emergency department, has calmed and processed his thoughts and emotions, he denies suicidal and homicidal intent Protective factors considered for safety management: Patient is not endorsing current suicidal and homicidal intent,  future orientation, willingness to engage in mental health treatment  Risk factors/concerns considered for safety management:  Prior attempt Depression Substance abuse/dependence Physical illness/chronic pain Barriers to accessing treatment Male gender Unmarried  Is there a safety management plan with the patient and treatment team to minimize risk factors and promote protective factors: Yes           Explain: Safety planning with strict return precautions to the ED if patient is at imminent risk to self or others in the future  Is crisis care placement or psychiatric hospitalization recommended: No     Based on my current evaluation and risk assessment, patient is determined at this time to be at:  Moderate Risk  *RISK ASSESSMENT Risk assessment is a dynamic process; it is possible that this patient's condition, and risk level, may change. This should be re-evaluated and managed over time as appropriate. Please re-consult psychiatric consult services if additional assistance is needed in terms of risk assessment and management. If your team decides to discharge this patient, please advise the patient how to best access emergency psychiatric services, or to call 911, if their condition worsens or they feel unsafe in any way.   Rodena Medin, MD Telepsychiatry Consult Services

## 2023-11-28 NOTE — ED Notes (Signed)
 IVC: [id number] ORIGINAL IN RED FOLDER 3 COPIES ON CLIPBOARD IN ORANGE COPY IN MEDICAL RECORDS DRAWER

## 2023-11-28 NOTE — ED Notes (Signed)
 Patient was given a [place] Sandwich bag with graham crackers and peanut butter and cup of coke.

## 2023-11-28 NOTE — Discharge Instructions (Addendum)
 Your workup tonight was reassuring. I recommend follow up with urology for your testicle pain and follow up with orthopedic surgery for your leg pain. You may use the crutches to help with ambulation. I have prescribed a muscle relaxant to help with the leg pain. I also prescribed ibuprofen for pain. I've attached a resource list for local shelters. I have also placed a referral to social work to reach out to you about available community resources.  If you develop any life threatening symptoms please return to the emergency department.

## 2023-12-04 ENCOUNTER — Emergency Department (HOSPITAL_COMMUNITY)
Admission: EM | Admit: 2023-12-04 | Discharge: 2023-12-04 | Disposition: A | Discharge status: Home / Self Care | Payer: MEDICAID | Attending: Emergency Medicine | Admitting: Emergency Medicine

## 2023-12-04 DIAGNOSIS — M79605 Pain in left leg: Secondary | ICD-10-CM | POA: Diagnosis present

## 2023-12-04 LAB — URINALYSIS, ROUTINE W REFLEX MICROSCOPIC
Bilirubin Urine: NEGATIVE
Glucose, UA: NEGATIVE mg/dL
Hgb urine dipstick: NEGATIVE
Ketones, ur: NEGATIVE mg/dL
Leukocytes,Ua: NEGATIVE
Nitrite: NEGATIVE
Protein, ur: NEGATIVE mg/dL
Specific Gravity, Urine: 1.029 (ref 1.005–1.030)
pH: 5 (ref 5.0–8.0)

## 2023-12-04 MED ORDER — METHYLPREDNISOLONE SODIUM SUCC 125 MG IJ SOLR
125.0000 mg | Freq: Once | INTRAMUSCULAR | Status: AC
  Administered 2023-12-04: 125 mg via INTRAMUSCULAR
  Filled 2023-12-04: qty 2

## 2023-12-04 MED ORDER — ACETAMINOPHEN 325 MG PO TABS
650.0000 mg | ORAL_TABLET | Freq: Once | ORAL | Status: AC
  Administered 2023-12-04: 650 mg via ORAL
  Filled 2023-12-04: qty 2

## 2023-12-04 MED ORDER — GABAPENTIN 300 MG PO CAPS
300.0000 mg | ORAL_CAPSULE | Freq: Once | ORAL | Status: AC
  Administered 2023-12-04: 300 mg via ORAL
  Filled 2023-12-04: qty 1

## 2023-12-04 MED ORDER — PREDNISONE 20 MG PO TABS: 40.0000 mg | ORAL_TABLET | Freq: Every day | ORAL | 0 refills | Status: DC

## 2023-12-04 MED ORDER — KETOROLAC TROMETHAMINE 15 MG/ML IJ SOLN
15.0000 mg | Freq: Once | INTRAMUSCULAR | Status: AC
  Administered 2023-12-04: 15 mg via INTRAMUSCULAR
  Filled 2023-12-04: qty 1

## 2023-12-04 NOTE — ED Notes (Signed)
 Patient discharged bus pass given upon departure

## 2023-12-04 NOTE — Discharge Instructions (Signed)
 Follow-up with the count listed above.  If you have emergent symptoms return to the emergency room.  Otherwise take the prednisone as prescribed for you starting tomorrow.
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
## 2023-12-04 NOTE — ED Triage Notes (Addendum)
 Pt reports from [employer], pt c/o left groin/thigh/leg pain, hx of the same, seen last week for it , no fracture noted. EMS reports right ankle swelling. Pt reports out of meds for schizophrena x 1 week.   As RN

## 2023-12-06 ENCOUNTER — Ambulatory Visit (HOSPITAL_COMMUNITY)
Admission: EM | Admit: 2023-12-06 | Discharge: 2023-12-08 | Payer: MEDICAID | Attending: Psychiatry | Admitting: Psychiatry

## 2023-12-06 DIAGNOSIS — F1410 Cocaine abuse, uncomplicated: Secondary | ICD-10-CM | POA: Diagnosis not present

## 2023-12-06 DIAGNOSIS — F10230 Alcohol dependence with withdrawal, uncomplicated: Secondary | ICD-10-CM | POA: Insufficient documentation

## 2023-12-06 DIAGNOSIS — F259 Schizoaffective disorder, unspecified: Secondary | ICD-10-CM | POA: Insufficient documentation

## 2023-12-06 DIAGNOSIS — F3289 Other specified depressive episodes: Secondary | ICD-10-CM

## 2023-12-06 DIAGNOSIS — Z9151 Personal history of suicidal behavior: Secondary | ICD-10-CM | POA: Insufficient documentation

## 2023-12-06 DIAGNOSIS — F1510 Other stimulant abuse, uncomplicated: Secondary | ICD-10-CM | POA: Insufficient documentation

## 2023-12-06 DIAGNOSIS — F1110 Opioid abuse, uncomplicated: Secondary | ICD-10-CM | POA: Diagnosis not present

## 2023-12-06 DIAGNOSIS — F1914 Other psychoactive substance abuse with psychoactive substance-induced mood disorder: Secondary | ICD-10-CM | POA: Insufficient documentation

## 2023-12-06 DIAGNOSIS — F1910 Other psychoactive substance abuse, uncomplicated: Secondary | ICD-10-CM

## 2023-12-06 DIAGNOSIS — F319 Bipolar disorder, unspecified: Secondary | ICD-10-CM | POA: Insufficient documentation

## 2023-12-06 DIAGNOSIS — Z560 Unemployment, unspecified: Secondary | ICD-10-CM | POA: Insufficient documentation

## 2023-12-06 LAB — POCT URINE DRUG SCREEN - MANUAL ENTRY (I-SCREEN)
POC Amphetamine UR: NOT DETECTED
POC Buprenorphine (BUP): NOT DETECTED
POC Cocaine UR: POSITIVE — AB
POC Marijuana UR: POSITIVE — AB
POC Methadone UR: NOT DETECTED
POC Methamphetamine UR: NOT DETECTED
POC Morphine: NOT DETECTED
POC Oxazepam (BZO): POSITIVE — AB
POC Oxycodone UR: NOT DETECTED
POC Secobarbital (BAR): NOT DETECTED

## 2023-12-06 LAB — ETHANOL: Alcohol, Ethyl (B): <10 mg/dL (ref <10)

## 2023-12-06 MED ORDER — ACETAMINOPHEN 325 MG PO TABS
650.0000 mg | ORAL_TABLET | Freq: Four times a day (QID) | ORAL | Status: DC | PRN
  Administered 2023-12-07: 650 mg via ORAL
  Filled 2023-12-06: qty 2

## 2023-12-06 MED ORDER — LOPERAMIDE HCL 2 MG PO CAPS: 2.0000 mg | ORAL_CAPSULE | ORAL | Status: DC | PRN

## 2023-12-06 MED ORDER — CLONIDINE HCL 0.1 MG PO TABS: 0.1000 mg | ORAL_TABLET | ORAL | Status: DC

## 2023-12-06 MED ORDER — METHOCARBAMOL 500 MG PO TABS
500.0000 mg | ORAL_TABLET | Freq: Three times a day (TID) | ORAL | Status: DC | PRN
  Administered 2023-12-06 – 2023-12-07 (×2): 500 mg via ORAL
  Filled 2023-12-06 (×2): qty 1

## 2023-12-06 MED ORDER — OLANZAPINE 10 MG IM SOLR: 5.0000 mg | Freq: Three times a day (TID) | INTRAMUSCULAR | Status: DC | PRN

## 2023-12-06 MED ORDER — HYDROXYZINE HCL 25 MG PO TABS
25.0000 mg | ORAL_TABLET | Freq: Four times a day (QID) | ORAL | Status: DC | PRN
  Administered 2023-12-06: 25 mg via ORAL
  Filled 2023-12-06: qty 1

## 2023-12-06 MED ORDER — ALUM & MAG HYDROXIDE-SIMETH 200-200-20 MG/5ML PO SUSP: 30.0000 mL | ORAL | Status: DC | PRN

## 2023-12-06 MED ORDER — MAGNESIUM HYDROXIDE 400 MG/5ML PO SUSP: 30.0000 mL | Freq: Every day | ORAL | Status: DC | PRN

## 2023-12-06 MED ORDER — OLANZAPINE 10 MG IM SOLR: 10.0000 mg | Freq: Three times a day (TID) | INTRAMUSCULAR | Status: DC | PRN

## 2023-12-06 MED ORDER — OLANZAPINE 5 MG PO TBDP: 5.0000 mg | ORAL_TABLET | Freq: Three times a day (TID) | ORAL | Status: DC | PRN

## 2023-12-06 MED ORDER — CLONIDINE HCL 0.1 MG PO TABS
0.1000 mg | ORAL_TABLET | Freq: Four times a day (QID) | ORAL | Status: DC
  Administered 2023-12-06: 0.1 mg via ORAL
  Filled 2023-12-06 (×2): qty 1

## 2023-12-06 MED ORDER — NAPROXEN 500 MG PO TABS
500.0000 mg | ORAL_TABLET | Freq: Two times a day (BID) | ORAL | Status: DC | PRN
  Administered 2023-12-06 – 2023-12-07 (×2): 500 mg via ORAL
  Filled 2023-12-06 (×2): qty 1

## 2023-12-06 MED ORDER — ONDANSETRON 4 MG PO TBDP: 4.0000 mg | ORAL_TABLET | Freq: Four times a day (QID) | ORAL | Status: DC | PRN

## 2023-12-06 MED ORDER — CLONIDINE HCL 0.1 MG PO TABS: 0.1000 mg | ORAL_TABLET | Freq: Every day | ORAL | Status: DC

## 2023-12-06 MED ORDER — DICYCLOMINE HCL 20 MG PO TABS: 20.0000 mg | ORAL_TABLET | Freq: Four times a day (QID) | ORAL | Status: DC | PRN
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
## 2023-12-06 NOTE — BH Assessment (Incomplete)
 Comprehensive Clinical Assessment (CCA) Note  12/06/2023 Mark Hammond [id number]  Disposition: Mark Guadeloupe, NP, recommends observation for safety and stabilization with psych reassessment in the AM.   The patient demonstrates the following risk factors for suicide: Chronic risk factors for suicide include: {Chronic Risk Factors for Suicide:30414011}. Acute risk factors for suicide include: {Acute Risk Factors for NWGNFAO:13086578}. Protective factors for this patient include: {Protective Factors for Suicide IONG:29528413}. Considering these factors, the overall suicide risk at this point appears to be {Desc; low/moderate/high:110033}. Patient {ACTION; IS/IS KGM:01027253} appropriate for outpatient follow up.  Mark Hammond is a 33 year old male presenting as a voluntary walk-in to [facility] due to SI with plan to overdose on pills and substance abuse treatment/detox. Patient has history of Bipolar, MDD, Schizophrenia and intentional overdose in December of 2024. Patient denied HI.    Patient admits to [facility] with plan. Patient reports that he currently uses 2.5 grams of crack cocaine daily and that he 1st used when he was 33 years old. Patient reports that he currently uses 3x 40 ounces daily and that he 1st used when he was 33 years old. Patient reported that he also currently uses "a hit or two" of methamphetamines daily and that he 1st used when he was 33 years old. Patient reports withdrawals of shakes and sweats. Patient reports auditory hallucinations of command voices telling him to "get high". Patient denied visual hallucinations. Patient reported history of cutting himself on the left arm and on his belly 2 weeks ago, stating "it was out of anger, I hadn't thought it through". Patient reports worsening depressive symptoms. Patient reports 4 hours nightly sleep and poor appetite.    Patient does not have a psychiatrist or therapist. Patient is not prescribed any psych medications. Patient was inpatient  at [facility] on last week. Patient reported last psych hospitalization was Fall 2024 at [facility].    Patient resides with girlfriend and has no kids. Patient is currently unemployed. Patient denied access to guns. Patient was cooperative during assessment. Patient seeking detox from alcohol, cocaine and pain pills. Patient unable to contract to safety.     Chief Complaint:  Chief Complaint  Patient presents with  . Addiction Problem   Visit Diagnosis:  Alcohol Abuse Major depressive disorder    CCA Screening, Triage and Referral (STR)  Patient Reported Information How did you hear about [place]? Self  What Is the Reason for Your Visit/Call Today? Mark Hammond is a 33 year old male presenting to [facility]. Pt was just discharged fro [facility] last week. Pt reports he is here seeking detox again for the same issue as last time. Pt reports he has no where to live and has no money. Pt mentions he has been drinking for several years and drinks daily. Pt mentions he steals alcohol at the store so that he can "get drunk". Pt mentions he drank a 5th of liquor yesterday and took his prescribed medication at the same time (oxy). Pt endorses suicidal thoughts at this time and a plan to overdose. Pt reports he feels like he is withdrawing from alcohol at this time. Pt denies srug use, Hi and Avh.  How Long Has This Been Causing You Problems? 1-6 months  What Do You Feel Would Help You the Most Today? Alcohol or Drug Use Treatment; Housing Assistance   Have You Recently Had Any Thoughts About Hurting Yourself? Yes  Are You Planning to Commit Suicide/Harm Yourself At This time? Yes   Flowsheet Row ED  from 12/06/2023 in [facility] ED from 12/04/2023 in [facility] Emergency Department at [facility] ED from 11/27/2023 in [facility] Emergency Department at [facility]  C-SSRS RISK CATEGORY High Risk High Risk High Risk       Have you Recently Had Thoughts  About Hurting Someone [name]? No  Are You Planning to Harm Someone at This Time? No  Explanation: n/a   Have You Used Any Alcohol or Drugs in the Past 24 Hours? Yes  How Long Ago Did You Use Drugs or Alcohol? No data recorded What Did You Use and How Much? 5th of hard liquor   Do You Currently Have a Therapist/Psychiatrist? No  Name of Therapist/Psychiatrist:    Have You Been Recently Discharged From Any Office Practice or Programs? Yes  Explanation of Discharge From Practice/Program: discharged from [facility] on last week.     CCA Screening Triage Referral Assessment Type of Contact: Face-to-Face  Telemedicine Service Delivery:   Is this Initial or Reassessment?   Date Telepsych consult ordered in [facility]:    Time Telepsych consult ordered in [facility]:    Location of Assessment: [facility] Assessment Services  Provider Location: [facility] Assessment Services   Collateral Involvement: none reported   Does Patient Have a Automotive engineer Guardian? No  Legal Guardian Contact Information: n/a  Copy of Legal Guardianship Form: -- (n/a)  Legal Guardian Notified of Arrival: -- (n/a)  Legal Guardian Notified of Pending Discharge: -- (n/a)  If Minor and Not Living with Parent(s), Who has Custody? n/a  Is CPS involved or ever been involved? Never  Is APS involved or ever been involved? Never   Patient Determined To Be At Risk for Harm To Self or Others Based on Review of Patient Reported Information or Presenting Complaint? Yes, for Self-Harm  Method: Plan with intent and identified person  Availability of Means: In hand or used  Intent: Clearly intends on inflicting harm that could cause death  Notification Required: No need or identified person  Additional Information for Danger to Others Potential: -- (n/a)  Additional Comments for Danger to Others Potential: n/a  Are There Guns or Other Weapons in Your Home? No  Types of Guns/Weapons: n/a  Are These Weapons Safely  Secured?                            -- (n/a)  Who Could Verify You Are Able To Have These Secured: n/a  Do You Have any Outstanding Charges, Pending Court Dates, Parole/Probation? none reported  Contacted To Inform of Risk of Harm To Self or Others: -- (n/a)    Does Patient Present under Involuntary Commitment? No    [place] of Residence: [place]   Patient Currently Receiving the Following Services: Not Receiving Services   Determination of Need: Urgent (48 hours)   Options For Referral: Facility-Based Crisis     CCA Biopsychosocial Patient Reported Schizophrenia/Schizoaffective Diagnosis in Past: No   Strengths: Self-awareness   Mental Health Symptoms Depression:  Difficulty Concentrating; Change in energy/activity; Hopelessness; Sleep (too much or little); Irritability; Fatigue   Duration of Depressive symptoms: Duration of Depressive Symptoms: Greater than two weeks   Mania:  Racing thoughts; Recklessness   Anxiety:   Difficulty concentrating; Sleep; Tension; Worrying; Restlessness   Psychosis:  Hallucinations   Duration of Psychotic symptoms: Duration of Psychotic Symptoms: Less than six months   Trauma:  N/A   Obsessions:  N/A   Compulsions:  None   Inattention:  None   Hyperactivity/Impulsivity:  Fidgets with hands/feet; Feeling of restlessness   Oppositional/Defiant Behaviors:  None   Emotional Irregularity:  Chronic feelings of emptiness; Transient, stress-related paranoia/disassociation   Other Mood/Personality Symptoms:  None Reported    Mental Status Exam Appearance and self-care  Stature:  Small   Weight:  Average weight   Clothing:  Casual (Scrubs)   Grooming:  Normal   Cosmetic use:  None   Posture/gait:  Normal   Motor activity:  Not Remarkable   Sensorium  Attention:  Normal   Concentration:  Normal   Orientation:  X5   Recall/memory:  Normal   Affect and Mood  Affect:  Appropriate; Depressed; Anxious   Mood:   Depressed; Anxious   Relating  Eye contact:  Normal   Facial expression:  Anxious; Responsive   Attitude toward examiner:  Cooperative   Thought and Language  Speech flow: Normal   Thought content:  Appropriate to Mood and Circumstances   Preoccupation:  None   Hallucinations:  Auditory; Command (Comment); Visual (Voices telling him to get high.)   Organization:  Patent examiner of Knowledge:  Average   Intelligence:  Average   Abstraction:  Normal   Judgement:  Impaired   Reality Testing:  Adequate   Insight:  Fair   Decision Making:  Impulsive   Social Functioning  Social Maturity:  Impulsive   Social Judgement:  Heedless; Impropriety   Stress  Stressors:  Housing; Surveyor, quantity; Relationship   Coping Ability:  Exhausted; Deficient supports; Overwhelmed   Skill Deficits:  Self-control; Decision making   Supports:  Family; Support needed     Religion: Religion/Spirituality Are You A Religious Person?: Yes How Might This Affect Treatment?: Will not affect treatment  Leisure/Recreation: Leisure / Recreation Do You Have Hobbies?: Yes Leisure and Hobbies: video games, listening to music and watching movies  Exercise/Diet: Exercise/Diet Do You Exercise?: No Have You Gained or Lost A Significant Amount of Weight in the Past Six Months?: No Do You Follow a Special Diet?: No Do You Have Any Trouble Sleeping?: Yes Explanation of Sleeping Difficulties: 4 hours nightly   CCA Employment/Education Employment/Work Situation: Employment / Work Situation Employment Situation: Unemployed Patient's Job has Been Impacted by Current Illness: No Has Patient ever Been in Equities trader?: No  Education: Education Is Patient Currently Attending School?: No Last Grade Completed: 10 Did You Product manager?: No Did You Have An Individualized Education Program (IIEP): No Did You Have Any Difficulty At [employer]?: No Patient's Education Has Been  Impacted by Current Illness: No   CCA Family/Childhood History Family and Relationship History: Family history Marital status: Single Does patient have children?: No  Childhood History:  Childhood History By whom was/is the patient raised?: Grandparents Did patient suffer any verbal/emotional/physical/sexual abuse as a child?: No Did patient suffer from severe childhood neglect?: No Has patient ever been sexually abused/assaulted/raped as an adolescent or adult?: No Was the patient ever a victim of a crime or a disaster?: No Witnessed domestic violence?: No Has patient been affected by domestic violence as an adult?: No       CCA Substance Use Alcohol/Drug Use: Alcohol / Drug Use Pain Medications: see MAR Prescriptions: see MAR Over the Counter: see MAR History of alcohol / drug use?: Yes Longest period of sobriety (when/how long): Unable to Quantify Negative Consequences of Use: Financial, Legal, Personal relationships, Work / School Withdrawal Symptoms: Irritability Substance #  1 Name of Substance 1: crack/cocaine 1 - Age of First Use: 15 1 - Amount (size/oz): 2.5 grams 1 - Frequency: daily 1 - Method of Aquiring: uta Substance #2 Name of Substance 2: alcohol 2 - Age of First Use: 15 2 - Amount (size/oz): 3x  40 ounces 2 - Frequency: daily 2 - Method of Aquiring: uta Substance #3 Name of Substance 3: meth 3 - Age of First Use: 15 3 - Amount (size/oz): "hit or two" 3 - Frequency: daily 3 - Method of Aquiring: uta                   ASAM's:  Six Dimensions of Multidimensional Assessment  Dimension 1:  Acute Intoxication and/or Withdrawal Potential:   Dimension 1:  Description of individual's past and current experiences of substance use and withdrawal: Ongoing usage.  Dimension 2:  Biomedical Conditions and Complications:   Dimension 2:  Description of patient's biomedical conditions and  complications: None reported.  Dimension 3:  Emotional,  Behavioral, or Cognitive Conditions and Complications:  Dimension 3:  Description of emotional, behavioral, or cognitive conditions and complications: SI with plan and worsening depression.  Dimension 4:  Readiness to Change:  Dimension 4:  Description of Readiness to Change criteria: Patient is seeking treatment.  Dimension 5:  Relapse, Continued use, or Continued Problem Potential:  Dimension 5:  Relapse, continued use, or continued problem potential critiera description: Continued usage.  Dimension 6:  Recovery/Living Environment:  Dimension 6:  Recovery/Iiving environment criteria description: Supported, lives with girlfriend.  ASAM Severity Score: ASAM's Severity Rating Score: 10  ASAM Recommended Level of Treatment: ASAM Recommended Level of Treatment: Level III Residential Treatment   Substance use Disorder (SUD) Substance Use Disorder (SUD)  Checklist Symptoms of Substance Use: Continued use despite having a persistent/recurrent physical/psychological problem caused/exacerbated by use, Continued use despite persistent or recurrent social, interpersonal problems, caused or exacerbated by use, Substance(s) often taken in larger amounts or over longer times than was intended, Social, occupational, recreational activities given up or reduced due to use, Presence of craving or strong urge to use, Persistent desire or unsuccessful efforts to cut down or control use, Evidence of tolerance  Recommendations for Services/Supports/Treatments: Recommendations for Services/Supports/Treatments Recommendations For Services/Supports/Treatments: Medication Management, Individual Therapy, Other (Comment), Facility Based Crisis  Disposition Recommendation per psychiatric provider: {CHLmaccldispo:31820}   DSM5 Diagnoses: Patient Active Problem List   Diagnosis Date Noted  . Polysubstance abuse (HCC) 11/21/2023  . Alcohol use disorder 08/04/2023  . Stimulant use disorder 08/04/2023  . Opioid use disorder  08/04/2023  . Tobacco use disorder 08/04/2023  . Cannabis use disorder 08/04/2023  . Hepatitis C 03/01/2023  . Anxiety state 01/14/2023  . Insomnia 01/14/2023  . Schizoaffective disorder (HCC) 01/13/2023  . Schizophrenia (HCC) 11/15/2019  . Polysubstance (including opioids) dependence, daily use (HCC)   . Substance induced mood disorder (HCC) 10/24/2019  . 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 Alcohol use disorder, severe, dependence (HCC) 08/24/2019  . 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 MDD (major depressive disorder) 03/06/2019     Referrals to Alternative Service(s): Referred to Alternative Service(s):   Place:   Date:   Time:    Referred to Alternative Service(s):   Place:   Date:   Time:    Referred to Alternative Service(s):   Place:   Date:   Time:    Referred to Alternative Service(s):   Place:   Date:   Time:  Burnetta Sabin, [facility]

## 2023-12-06 NOTE — ED Notes (Signed)
 Patient admitted to [facility] seeking alcohol detox. On arrival A/OX4. C/o Left leg pain and uses crutches .Stated he felt suicidal and have a plan of overdosing. Denies HI & AVH. Offered him something to eat, meds given and made comfortable in bed. Will keep monitoring for safety.

## 2023-12-06 NOTE — ED Notes (Signed)
 Patient observed/assessed in bed/chair resting quietly appearing in no distress and verbalizing no complaints at this time. Will continue to monitor.

## 2023-12-06 NOTE — ED Provider Notes (Signed)
 [facility] Continuous Assessment Admission H&P  Date: 12/06/23 Patient Name: Mark Hammond MRN: [id number] Chief Complaint: need rehab from crack and pain medications  Diagnoses:  Final diagnoses:  Polysubstance abuse (HCC)  Opioid abuse (HCC)  Other depression    HPI: Mark Hammond, 33 y/o male with a history of schizoaffective disorder, polysubstance abuse, MDD, alcohol intoxication, intentional overdose.  Presented to [facility] voluntarily.  Per the patient he needs to get rehab for crack cocaine, opioid abuse, and also alcohol according to the patient he last used yesterday.  Per the patient he is trying to get into [facility] but they will not accept him until he comes here.  I review of patient records show patient was last seen here at [facility] 2 weeks ago and after being here he was discharged and was supposed to follow up with outpatient services but he did not.  According to patient he is currently not seeing a psychiatrist or therapist, lives with his girlfriend currently unemployed.  Face-to-face observation of patient, patient is alert and oriented x 4, speech is clear, maintain eye contact.  Patient is very nonchalant when talking as if he does not care about anything.  Patient denies SI, HI, AVH or paranoia.  Reports crack cocaine use and also opioid medication use within the last 24 hours.  Patient does not seem to be in any immediate distress at this time does not seem to be a risk to himself or others.  Writer discussed with patient that he needs to stick with the program whenever he is admitted because he keeps doing it over and over after being discharged.  Patient is in agreement with plan of care.  Recommend observation  Total Time spent with patient: 20 minutes  Musculoskeletal  Strength & Muscle Tone: within normal limits Gait & Station: normal Patient leans: N/A  Psychiatric Specialty Exam  Presentation General Appearance:  Casual  Eye Contact: Good  Speech: Clear  and Coherent  Speech Volume: Normal  Handedness: Right   Mood and Affect  Mood: Anxious  Affect: Congruent   Thought Process  Thought Processes: Coherent; Linear  Descriptions of Associations:Intact  Orientation:Full (Time, Place and Person)  Thought Content:Logical  Diagnosis of Schizophrenia or Schizoaffective disorder in past: No   Hallucinations:Hallucinations: None  Ideas of Reference:None  Suicidal Thoughts:Suicidal Thoughts: No  Homicidal Thoughts:Homicidal Thoughts: No   Sensorium  Memory: Immediate Fair  Judgment: Fair  Insight: Fair   Art therapist  Concentration: Fair  Attention Span: Fair  Recall: Fair  Fund of Knowledge: Fair  Language: Fair   Psychomotor Activity  Psychomotor Activity: Psychomotor Activity: Normal   Assets  Assets: Desire for Improvement; Resilience; Social Support   Sleep  Sleep: Sleep: Fair Number of Hours of Sleep: 6   Nutritional Assessment (For OBS and FBC admissions only) Has the patient had a weight loss or gain of 10 pounds or more in the last 3 months?: No Has the patient had a decrease in food intake/or appetite?: No Does the patient have dental problems?: No Does the patient have eating habits or behaviors that may be indicators of an eating disorder including binging or inducing vomiting?: No Has the patient recently lost weight without trying?: 0 Has the patient been eating poorly because of a decreased appetite?: 0 Malnutrition Screening Tool Score: 0    Physical Exam HENT:     Head: Normocephalic.     Nose: Nose normal.  Eyes:     Pupils: Pupils are equal, round,  and reactive to light.  Cardiovascular:     Rate and Rhythm: Normal rate.  Pulmonary:     Effort: Pulmonary effort is normal.  Musculoskeletal:        General: Normal range of motion.     Cervical back: Normal range of motion.  Skin:    General: Skin is warm.  Neurological:     General: No focal  deficit present.     Mental Status: He is alert.  Psychiatric:        Mood and Affect: Mood normal.        Behavior: Behavior normal.        Thought Content: Thought content normal.        Judgment: Judgment normal.    Review of Systems  Constitutional: Negative.   HENT: Negative.    Eyes: Negative.   Respiratory: Negative.    Cardiovascular: Negative.   Gastrointestinal: Negative.   Genitourinary: Negative.   Musculoskeletal: Negative.   Skin: Negative.   Neurological: Negative.   Psychiatric/Behavioral:  Positive for depression and substance abuse. The patient is nervous/anxious.     Blood pressure 131/66, pulse 99, resp. rate 20, SpO2 99%. There is no height or weight on file to calculate BMI.  Past Psychiatric History: Polysubstance abuse, cocaine, schizoaffective disorder, intentional overdose, alcohol abuse,  Is the patient at risk to self? No  Has the patient been a risk to self in the past 6 months? No .    Has the patient been a risk to self within the distant past? No   Is the patient a risk to others? No   Has the patient been a risk to others in the past 6 months? No   Has the patient been a risk to others within the distant past? No   Past Medical History: See chart  Family History: Unknown  Social History: Opioid abuse, alcohol, crack cocaine  Last Labs:  Admission on 12/04/2023, Discharged on 12/04/2023  Component Date Value Ref Range Status   Color, Urine 12/04/2023 YELLOW  YELLOW Final   APPearance 12/04/2023 CLEAR  CLEAR Final   Specific Gravity, Urine 12/04/2023 1.029  1.005 - 1.030 Final   pH 12/04/2023 5.0  5.0 - 8.0 Final   Glucose, UA 12/04/2023 NEGATIVE  NEGATIVE mg/dL Final   Hgb urine dipstick 12/04/2023 NEGATIVE  NEGATIVE Final   Bilirubin Urine 12/04/2023 NEGATIVE  NEGATIVE Final   Ketones, ur 12/04/2023 NEGATIVE  NEGATIVE mg/dL Final   Protein, ur 09/81/1914 NEGATIVE  NEGATIVE mg/dL Final   Nitrite 78/29/5621 NEGATIVE  NEGATIVE Final    Leukocytes,Ua 12/04/2023 NEGATIVE  NEGATIVE Final   Performed at [facility], [address], [place], [place] [address]  Admission on 11/27/2023, Discharged on 11/28/2023  Component Date Value Ref Range Status   WBC 11/27/2023 13.5 (H)  4.0 - 10.5 K/uL Final   RBC 11/27/2023 4.12 (L)  4.22 - 5.81 MIL/uL Final   Hemoglobin 11/27/2023 12.8 (L)  13.0 - 17.0 g/dL Final   HCT 78/46/9629 38.4 (L)  39.0 - 52.0 % Final   MCV 11/27/2023 93.2  80.0 - 100.0 fL Final   MCH 11/27/2023 31.1  26.0 - 34.0 pg Final   MCHC 11/27/2023 33.3  30.0 - 36.0 g/dL Final   RDW 52/84/1324 13.4  11.5 - 15.5 % Final   Platelets 11/27/2023 375  150 - 400 K/uL Final   nRBC 11/27/2023 0.0  0.0 - 0.2 % Final   Performed at [facility], [address] [place], [place] [address]   Sodium 11/27/2023 132 (L)  135 - 145 mmol/L Final   Potassium 11/27/2023 4.3  3.5 - 5.1 mmol/L Final   Chloride 11/27/2023 95 (L)  98 - 111 mmol/L Final   CO2 11/27/2023 21 (L)  22 - 32 mmol/L Final   Glucose, Bld 11/27/2023 113 (H)  70 - 99 mg/dL Final   Glucose reference range applies only to samples taken after fasting for at least 8 hours.   BUN 11/27/2023 15  6 - 20 mg/dL Final   Creatinine, Ser 11/27/2023 0.86  0.61 - 1.24 mg/dL Final   Calcium 21/30/8657 9.5  8.9 - 10.3 mg/dL Final   GFR, Estimated 11/27/2023 >60  >60 mL/min Final   Comment: (NOTE) Calculated using the CKD-EPI Creatinine Equation (2021)    Anion gap 11/27/2023 16 (H)  5 - 15 Final   Performed at [facility], [address], [place], [place] [address]   Salicylate Lvl 11/27/2023 <7.0 (L)  7.0 - 30.0 mg/dL Final   Performed at [facility], [address] [place], [place] [address]   Acetaminophen (Tylenol), Serum 11/27/2023 10  10 - 30 ug/mL Final   Comment: (NOTE) Therapeutic concentrations vary significantly. A range of 10-30 ug/mL  may be an effective concentration for many patients. However, some  are best treated at  concentrations outside of this range. Acetaminophen concentrations >150 ug/mL at 4 hours after ingestion  and >50 ug/mL at 12 hours after ingestion are often associated with  toxic reactions.  Performed at [facility], [address], [place], [place] [address]    Alcohol, Ethyl (B) 11/27/2023 127 (H)  <10 mg/dL Final   Comment: (NOTE) Lowest detectable limit for serum alcohol is 10 mg/dL.  For medical purposes only. Performed at [facility], [address], [place], [place] [address]    Opiates 11/27/2023 POSITIVE (A)  NONE DETECTED Final   Cocaine 11/27/2023 POSITIVE (A)  NONE DETECTED Final   Benzodiazepines 11/27/2023 POSITIVE (A)  NONE DETECTED Final   Amphetamines 11/27/2023 NONE DETECTED  NONE DETECTED Final   Tetrahydrocannabinol 11/27/2023 POSITIVE (A)  NONE DETECTED Final   Barbiturates 11/27/2023 NONE DETECTED  NONE DETECTED Final   Comment: (NOTE) DRUG SCREEN FOR MEDICAL PURPOSES ONLY.  IF CONFIRMATION IS NEEDED FOR ANY PURPOSE, NOTIFY LAB WITHIN 5 DAYS.  LOWEST DETECTABLE LIMITS FOR URINE DRUG SCREEN Drug Class                     Cutoff (ng/mL) Amphetamine and metabolites    1000 Barbiturate and metabolites    200 Benzodiazepine                 200 Opiates and metabolites        300 Cocaine and metabolites        300 THC                            50 Performed at [facility], [address], [place], [place] [address]    Color, Urine 11/27/2023 AMBER (A)  YELLOW Final   BIOCHEMICALS MAY BE AFFECTED BY COLOR   APPearance 11/27/2023 CLEAR  CLEAR Final   Specific Gravity, Urine 11/27/2023 1.020  1.005 - 1.030 Final   pH 11/27/2023 5.0  5.0 - 8.0 Final   Glucose, UA 11/27/2023 NEGATIVE  NEGATIVE mg/dL Final   Hgb urine  dipstick 11/27/2023 NEGATIVE  NEGATIVE Final   Bilirubin Urine 11/27/2023 NEGATIVE  NEGATIVE Final   Ketones, ur 11/27/2023 NEGATIVE  NEGATIVE mg/dL Final   Protein, ur 40/98/1191 NEGATIVE  NEGATIVE mg/dL Final   Nitrite  47/82/9562 NEGATIVE  NEGATIVE Final   Leukocytes,Ua 11/27/2023 NEGATIVE  NEGATIVE Final   Performed at [facility], [address] [place], [place] [address]   Neisseria Gonorrhea 11/27/2023 Negative   Final   Chlamydia 11/27/2023 Negative   Final   Comment 11/27/2023 Normal Reference Ranger Chlamydia - Negative   Final   Comment 11/27/2023 Normal Reference Range Neisseria Gonorrhea - Negative   Final  Admission on 11/21/2023, Discharged on 11/27/2023  Component Date Value Ref Range Status   SARS Coronavirus 2 by RT PCR 11/25/2023 NEGATIVE  NEGATIVE Final   Performed at [facility], [address], [place], [place] [address]   WBC 11/21/2023 5.6  4.0 - 10.5 K/uL Final   RBC 11/21/2023 4.14 (L)  4.22 - 5.81 MIL/uL Final   Hemoglobin 11/21/2023 13.1  13.0 - 17.0 g/dL Final   HCT 96/29/5284 38.8 (L)  39.0 - 52.0 % Final   MCV 11/21/2023 93.7  80.0 - 100.0 fL Final   MCH 11/21/2023 31.6  26.0 - 34.0 pg Final   MCHC 11/21/2023 33.8  30.0 - 36.0 g/dL Final   RDW 13/24/4010 13.2  11.5 - 15.5 % Final   Platelets 11/21/2023 298  150 - 400 K/uL Final   nRBC 11/21/2023 0.0  0.0 - 0.2 % Final   Neutrophils Relative % 11/21/2023 83  % Final   Neutro Abs 11/21/2023 4.6  1.7 - 7.7 K/uL Final   Lymphocytes Relative 11/21/2023 14  % Final   Lymphs Abs 11/21/2023 0.8  0.7 - 4.0 K/uL Final   Monocytes Relative 11/21/2023 3  % Final   Monocytes Absolute 11/21/2023 0.2  0.1 - 1.0 K/uL Final   Eosinophils Relative 11/21/2023 0  % Final   Eosinophils Absolute 11/21/2023 0.0  0.0 - 0.5 K/uL Final   Basophils Relative 11/21/2023 0  % Final   Basophils Absolute 11/21/2023 0.0  0.0 - 0.1 K/uL Final   Immature Granulocytes 11/21/2023 0  % Final   Abs Immature Granulocytes 11/21/2023 0.01  0.00 - 0.07 K/uL Final   Performed at [facility], [address], [place], [place] [address]   Sodium 11/21/2023 135  135 - 145 mmol/L Final   Potassium 11/21/2023 4.2  3.5 - 5.1 mmol/L Final    Chloride 11/21/2023 99  98 - 111 mmol/L Final   CO2 11/21/2023 26  22 - 32 mmol/L Final   Glucose, Bld 11/21/2023 162 (H)  70 - 99 mg/dL Final   Glucose reference range applies only to samples taken after fasting for at least 8 hours.   BUN 11/21/2023 7  6 - 20 mg/dL Final   Creatinine, Ser 11/21/2023 0.63  0.61 - 1.24 mg/dL Final   Calcium 66/44/0347 8.9  8.9 - 10.3 mg/dL Final   Total Protein 42/59/5638 8.1  6.5 - 8.1 g/dL Final   Albumin 75/64/3329 2.8 (L)  3.5 - 5.0 g/dL Final   AST 51/88/4166 91 (H)  15 - 41 U/L Final   ALT 11/21/2023 55 (H)  0 - 44 U/L Final   Alkaline Phosphatase 11/21/2023 87  38 - 126 U/L Final   Total Bilirubin 11/21/2023 0.8  0.0 - 1.2 mg/dL Final   GFR, Estimated 11/21/2023 >60  >60 mL/min Final  Comment: (NOTE) Calculated using the CKD-EPI Creatinine Equation (2021)    Anion gap 11/21/2023 10  5 - 15 Final   Performed at [facility], [address] [place], [place] [address]   Alcohol, Ethyl (B) 11/21/2023 <10  <10 mg/dL Final   Comment: (NOTE) Lowest detectable limit for serum alcohol is 10 mg/dL.  For medical purposes only. Performed at [facility], [address] [place], [place] [address]    TSH 11/21/2023 0.365  0.350 - 4.500 uIU/mL Final   Comment: Performed by a 3rd Generation assay with a functional sensitivity of <=0.01 uIU/mL. Performed at [facility], [address], [place], [place] [address]    POC Amphetamine UR 11/22/2023 None Detected  NONE DETECTED (Cut Off Level 1000 ng/mL) Final   POC Secobarbital (BAR) 11/22/2023 None Detected  NONE DETECTED (Cut Off Level 300 ng/mL) Final   POC Buprenorphine (BUP) 11/22/2023 None Detected  NONE DETECTED (Cut Off Level 10 ng/mL) Final   POC Oxazepam (BZO) 11/22/2023 Positive (A)  NONE DETECTED (Cut Off Level 300 ng/mL) Final   POC Cocaine UR 11/22/2023 Positive (A)  NONE DETECTED (Cut Off Level 300 ng/mL) Final   POC Methamphetamine UR 11/22/2023 None Detected  NONE DETECTED  (Cut Off Level 1000 ng/mL) Final   POC Morphine 11/22/2023 None Detected  NONE DETECTED (Cut Off Level 300 ng/mL) Final   POC Methadone UR 11/22/2023 None Detected  NONE DETECTED (Cut Off Level 300 ng/mL) Final   POC Oxycodone UR 11/22/2023 None Detected  NONE DETECTED (Cut Off Level 100 ng/mL) Final   POC Marijuana UR 11/22/2023 Positive (A)  NONE DETECTED (Cut Off Level 50 ng/mL) Final   Hgb A1c MFr Bld 11/25/2023 5.9 (H)  4.8 - 5.6 % Final   Comment: (NOTE) Pre diabetes:          5.7%-6.4%  Diabetes:              >6.4%  Glycemic control for   <7.0% adults with diabetes    Mean Plasma Glucose 11/25/2023 122.63  mg/dL Final   Performed at [facility], [address], [place], [place] [address]   Sodium 11/25/2023 136  135 - 145 mmol/L Final   Potassium 11/25/2023 4.2  3.5 - 5.1 mmol/L Final   Chloride 11/25/2023 101  98 - 111 mmol/L Final   CO2 11/25/2023 27  22 - 32 mmol/L Final   Glucose, Bld 11/25/2023 108 (H)  70 - 99 mg/dL Final   Glucose reference range applies only to samples taken after fasting for at least 8 hours.   BUN 11/25/2023 9  6 - 20 mg/dL Final   Creatinine, Ser 11/25/2023 0.68  0.61 - 1.24 mg/dL Final   Calcium 29/56/2130 8.6 (L)  8.9 - 10.3 mg/dL Final   Total Protein 86/57/8469 7.0  6.5 - 8.1 g/dL Final   Albumin 62/95/2841 2.3 (L)  3.5 - 5.0 g/dL Final   AST 32/44/0102 81 (H)  15 - 41 U/L Final   ALT 11/25/2023 58 (H)  0 - 44 U/L Final   Alkaline Phosphatase 11/25/2023 84  38 - 126 U/L Final   Total Bilirubin 11/25/2023 0.7  0.0 - 1.2 mg/dL Final   GFR, Estimated 11/25/2023 >60  >60 mL/min Final   Comment: (NOTE) Calculated using the CKD-EPI Creatinine Equation (2021)    Anion gap 11/25/2023 8  5 - 15 Final   Performed at [facility], [address], [place],  [place] [address]   Iron 11/25/2023 21 (L)  45 - 182 ug/dL Final   TIBC 60/45/4098 280  250 - 450 ug/dL Final   Saturation Ratios 11/25/2023 8 (L)  17.9 - 39.5 % Final   UIBC  11/25/2023 259  ug/dL Final   Performed at [facility], [address], [place], [place] [address]   Vitamin B-12 11/25/2023 549  180 - 914 pg/mL Final   Comment: (NOTE) This assay is not validated for testing neonatal or myeloproliferative syndrome specimens for Vitamin B12 levels. Performed at [facility], [address] [place], [place] [address]    Ferritin 11/25/2023 217  24 - 336 ng/mL Final   Performed at [facility], [address] [place], [place] [address]  Admission on 11/19/2023, Discharged on 11/20/2023  Component Date Value Ref Range Status   Sodium 11/19/2023 138  135 - 145 mmol/L Final   Potassium 11/19/2023 3.4 (L)  3.5 - 5.1 mmol/L Final   Chloride 11/19/2023 103  98 - 111 mmol/L Final   CO2 11/19/2023 26  22 - 32 mmol/L Final   Glucose, Bld 11/19/2023 123 (H)  70 - 99 mg/dL Final   Glucose reference range applies only to samples taken after fasting for at least 8 hours.   BUN 11/19/2023 <5 (L)  6 - 20 mg/dL Final   Creatinine, Ser 11/19/2023 0.50 (L)  0.61 - 1.24 mg/dL Final   Calcium 86/57/8469 8.2 (L)  8.9 - 10.3 mg/dL Final   GFR, Estimated 11/19/2023 >60  >60 mL/min Final   Comment: (NOTE) Calculated using the CKD-EPI Creatinine Equation (2021)    Anion gap 11/19/2023 9  5 - 15 Final   Performed at [facility], [address], [place], [place] [address]   WBC 11/19/2023 8.4  4.0 - 10.5 K/uL Final   RBC 11/19/2023 4.00 (L)  4.22 - 5.81 MIL/uL Final   Hemoglobin 11/19/2023 12.6 (L)  13.0 - 17.0 g/dL Final   HCT 84/13/2440 37.7 (L)  39.0 - 52.0 % Final   MCV 11/19/2023 94.3  80.0 - 100.0 fL Final   MCH 11/19/2023 31.5  26.0 - 34.0 pg Final   MCHC 11/19/2023 33.4  30.0 - 36.0 g/dL Final   RDW 07/30/2535 13.2  11.5 - 15.5 % Final   Platelets 11/19/2023 267  150 - 400 K/uL Final   nRBC 11/19/2023 0.0  0.0 - 0.2 % Final   Neutrophils Relative % 11/19/2023 65  % Final   Neutro Abs 11/19/2023 5.5  1.7 - 7.7 K/uL Final    Lymphocytes Relative 11/19/2023 22  % Final   Lymphs Abs 11/19/2023 1.9  0.7 - 4.0 K/uL Final   Monocytes Relative 11/19/2023 11  % Final   Monocytes Absolute 11/19/2023 0.9  0.1 - 1.0 K/uL Final   Eosinophils Relative 11/19/2023 1  % Final   Eosinophils Absolute 11/19/2023 0.1  0.0 - 0.5 K/uL Final   Basophils Relative 11/19/2023 1  % Final   Basophils Absolute 11/19/2023 0.1  0.0 - 0.1 K/uL Final   Immature Granulocytes 11/19/2023 0  % Final   Abs Immature Granulocytes 11/19/2023 0.03  0.00 - 0.07 K/uL Final   Performed at [facility], [address] [place], [place] [address]   Sed Rate 11/19/2023 77 (H)  0 - 16 mm/hr Final   Performed at [facility], [address], [place], [place] [address]   CRP 11/19/2023 4.2 (H)  <  1.0 mg/dL Final   Performed at [facility], [address] [place], [place] [address]   Sodium 11/19/2023 141  135 - 145 mmol/L Final   Potassium 11/19/2023 3.5  3.5 - 5.1 mmol/L Final   Chloride 11/19/2023 101  98 - 111 mmol/L Final   BUN 11/19/2023 <3 (L)  6 - 20 mg/dL Final   Creatinine, Ser 11/19/2023 0.70  0.61 - 1.24 mg/dL Final   Glucose, Bld 84/69/6295 126 (H)  70 - 99 mg/dL Final   Glucose reference range applies only to samples taken after fasting for at least 8 hours.   Calcium, Ion 11/19/2023 1.05 (L)  1.15 - 1.40 mmol/L Final   TCO2 11/19/2023 27  22 - 32 mmol/L Final   Hemoglobin 11/19/2023 12.9 (L)  13.0 - 17.0 g/dL Final   HCT 28/41/3244 38.0 (L)  39.0 - 52.0 % Final   SARS Coronavirus 2 by RT PCR 11/19/2023 NEGATIVE  NEGATIVE Final   Comment: (NOTE) SARS-CoV-2 target nucleic acids are NOT DETECTED.  The SARS-CoV-2 RNA is generally detectable in upper respiratory specimens during the acute phase of infection. The lowest concentration of SARS-CoV-2 viral copies this assay can detect is 138 copies/mL. A negative result does not preclude SARS-Cov-2 infection and should not be used as the sole basis for  treatment or other patient management decisions. A negative result may occur with  improper specimen collection/handling, submission of specimen other than nasopharyngeal swab, presence of viral mutation(s) within the areas targeted by this assay, and inadequate number of viral copies(<138 copies/mL). A negative result must be combined with clinical observations, patient history, and epidemiological information. The expected result is Negative.  Fact Sheet for Patients:  BloggerCourse.com  Fact Sheet for Healthcare Providers:  SeriousBroker.it  This test is no                          t yet approved or cleared by the [place] FDA and  has been authorized for detection and/or diagnosis of SARS-CoV-2 by FDA under an Emergency Use Authorization (EUA). This EUA will remain  in effect (meaning this test can be used) for the duration of the COVID-19 declaration under Section 564(b)(1) of the Act, 21 U.S.C.section 360bbb-3(b)(1), unless the authorization is terminated  or revoked sooner.       Influenza A by PCR 11/19/2023 NEGATIVE  NEGATIVE Final   Influenza B by PCR 11/19/2023 NEGATIVE  NEGATIVE Final   Comment: (NOTE) The Xpert Xpress SARS-CoV-2/FLU/RSV plus assay is intended as an aid in the diagnosis of influenza from Nasopharyngeal swab specimens and should not be used as a sole basis for treatment. Nasal washings and aspirates are unacceptable for Xpert Xpress SARS-CoV-2/FLU/RSV testing.  Fact Sheet for Patients: BloggerCourse.com  Fact Sheet for Healthcare Providers: SeriousBroker.it  This test is not yet approved or cleared by the [place] FDA and has been authorized for detection and/or diagnosis of SARS-CoV-2 by FDA under an Emergency Use Authorization (EUA). This EUA will remain in effect (meaning this test can be used) for the duration of the COVID-19  declaration under Section 564(b)(1) of the Act, 21 U.S.C. section 360bbb-3(b)(1), unless the authorization is terminated or revoked.     Resp Syncytial Virus by PCR 11/19/2023 NEGATIVE  NEGATIVE Final   Comment: (NOTE) Fact Sheet for Patients: BloggerCourse.com  Fact Sheet for Healthcare Providers: SeriousBroker.it  This test is not yet approved or cleared by the [place] FDA and has been  authorized for detection and/or diagnosis of SARS-CoV-2 by FDA under an Emergency Use Authorization (EUA). This EUA will remain in effect (meaning this test can be used) for the duration of the COVID-19 declaration under Section 564(b)(1) of the Act, 21 U.S.C. section 360bbb-3(b)(1), unless the authorization is terminated or revoked.  Performed at [facility], [address] [place], [place] [address]   Admission on 11/09/2023, Discharged on 11/09/2023  Component Date Value Ref Range Status   Color, Urine 11/09/2023 AMBER (A)  YELLOW Final   BIOCHEMICALS MAY BE AFFECTED BY COLOR   APPearance 11/09/2023 CLEAR  CLEAR Final   Specific Gravity, Urine 11/09/2023 1.036 (H)  1.005 - 1.030 Final   pH 11/09/2023 5.0  5.0 - 8.0 Final   Glucose, UA 11/09/2023 NEGATIVE  NEGATIVE mg/dL Final   Hgb urine dipstick 11/09/2023 NEGATIVE  NEGATIVE Final   Bilirubin Urine 11/09/2023 SMALL (A)  NEGATIVE Final   Ketones, ur 11/09/2023 NEGATIVE  NEGATIVE mg/dL Final   Protein, ur 60/45/4098 30 (A)  NEGATIVE mg/dL Final   Nitrite 11/91/4782 NEGATIVE  NEGATIVE Final   Leukocytes,Ua 11/09/2023 NEGATIVE  NEGATIVE Final   RBC / HPF 11/09/2023 0-5  0 - 5 RBC/hpf Final   WBC, UA 11/09/2023 0-5  0 - 5 WBC/hpf Final   Bacteria, UA 11/09/2023 NONE SEEN  NONE SEEN Final   Squamous Epithelial / HPF 11/09/2023 0-5  0 - 5 /HPF Final   Mucus 11/09/2023 PRESENT   Final   Performed at [facility], [address] [place], [place]  [address]   Opiates 11/09/2023 POSITIVE (A)  NONE DETECTED Final   Cocaine 11/09/2023 POSITIVE (A)  NONE DETECTED Final   Benzodiazepines 11/09/2023 POSITIVE (A)  NONE DETECTED Final   Amphetamines 11/09/2023 NONE DETECTED  NONE DETECTED Final   Tetrahydrocannabinol 11/09/2023 POSITIVE (A)  NONE DETECTED Final   Barbiturates 11/09/2023 NONE DETECTED  NONE DETECTED Final   Comment: (NOTE) DRUG SCREEN FOR MEDICAL PURPOSES ONLY.  IF CONFIRMATION IS NEEDED FOR ANY PURPOSE, NOTIFY LAB WITHIN 5 DAYS.  LOWEST DETECTABLE LIMITS FOR URINE DRUG SCREEN Drug Class                     Cutoff (ng/mL) Amphetamine and metabolites    1000 Barbiturate and metabolites    200 Benzodiazepine                 200 Opiates and metabolites        300 Cocaine and metabolites        300 THC                            50 Performed at [facility], [address], [place], [place] [address]   Admission on 08/10/2023, Discharged on 08/14/2023  Component Date Value Ref Range Status   Sodium 08/10/2023 137  135 - 145 mmol/L Final   Potassium 08/10/2023 3.4 (L)  3.5 - 5.1 mmol/L Final   Chloride 08/10/2023 100  98 - 111 mmol/L Final   CO2 08/10/2023 25  22 - 32 mmol/L Final   Glucose, Bld 08/10/2023 109 (H)  70 - 99 mg/dL Final   Glucose reference range applies only to samples taken after fasting for at least 8 hours.   BUN 08/10/2023 11  6 - 20 mg/dL Final   Creatinine, Ser 08/10/2023 0.71  0.61 - 1.24 mg/dL Final   Calcium 78/46/9629 8.8 (L)  8.9 - 10.3 mg/dL Final   Total Protein 40/98/1191 7.9  6.5 - 8.1 g/dL Final   Albumin 47/82/9562 3.8  3.5 - 5.0 g/dL Final   AST 13/05/6577 466 (H)  15 - 41 U/L Final   ALT 08/10/2023 264 (H)  0 - 44 U/L Final   Alkaline Phosphatase 08/10/2023 112  38 - 126 U/L Final   Total Bilirubin 08/10/2023 1.2 (H)  <1.2 mg/dL Final   GFR, Estimated 08/10/2023 >60  >60 mL/min Final   Comment: (NOTE) Calculated using the CKD-EPI Creatinine Equation (2021)     Anion gap 08/10/2023 12  5 - 15 Final   Performed at [facility], [address], [place], [place] [address]   Alcohol, Ethyl (B) 08/10/2023 144 (H)  <10 mg/dL Final   Comment: (NOTE) Lowest detectable limit for serum alcohol is 10 mg/dL.  For medical purposes only. Performed at [facility], [address] [place], [place] [address]    Salicylate Lvl 08/10/2023 <7.0 (L)  7.0 - 30.0 mg/dL Final   Performed at [facility], [address] [place], [place] [address]   Acetaminophen (Tylenol), Serum 08/10/2023 <10 (L)  10 - 30 ug/mL Final   Comment: (NOTE) Therapeutic concentrations vary significantly. A range of 10-30 ug/mL  may be an effective concentration for many patients. However, some  are best treated at concentrations outside of this range. Acetaminophen concentrations >150 ug/mL at 4 hours after ingestion  and >50 ug/mL at 12 hours after ingestion are often associated with  toxic reactions.  Performed at [facility], [address], [place], [place] [address]    WBC 08/10/2023 8.0  4.0 - 10.5 K/uL Final   RBC 08/10/2023 4.13 (L)  4.22 - 5.81 MIL/uL Final   Hemoglobin 08/10/2023 13.8  13.0 - 17.0 g/dL Final   HCT 25/36/6440 39.4  39.0 - 52.0 % Final   MCV 08/10/2023 95.4  80.0 - 100.0 fL Final   MCH 08/10/2023 33.4  26.0 - 34.0 pg Final   MCHC 08/10/2023 35.0  30.0 - 36.0 g/dL Final   RDW 34/74/2595 13.1  11.5 - 15.5 % Final   Platelets 08/10/2023 226  150 - 400 K/uL Final   nRBC 08/10/2023 0.0  0.0 - 0.2 % Final   Performed at [facility], [address] [place], [place] [address]   Opiates 08/10/2023 NONE DETECTED  NONE DETECTED Final   Cocaine 08/10/2023 POSITIVE (A)  NONE DETECTED Final   Benzodiazepines 08/10/2023 NONE DETECTED  NONE DETECTED Final   Amphetamines 08/10/2023 POSITIVE (A)  NONE DETECTED Final   Tetrahydrocannabinol 08/10/2023 POSITIVE (A)  NONE DETECTED  Final   Barbiturates 08/10/2023 NONE DETECTED  NONE DETECTED Final   Comment: (NOTE) DRUG SCREEN FOR MEDICAL PURPOSES ONLY.  IF CONFIRMATION IS NEEDED FOR ANY PURPOSE, NOTIFY LAB WITHIN 5 DAYS.  LOWEST DETECTABLE LIMITS FOR URINE DRUG SCREEN Drug Class                     Cutoff (ng/mL) Amphetamine and metabolites    1000 Barbiturate and metabolites    200 Benzodiazepine                 200 Opiates and metabolites        300 Cocaine and metabolites        300 THC  50 Performed at [facility], [address], [place], [place] [address]    Total CK 08/10/2023 461 (H)  49 - 397 U/L Final   Performed at [facility], [address], [place], [place] [address]   Hepatitis B Surface Ag 08/11/2023 NON REACTIVE  NON REACTIVE Final   HCV Ab 08/11/2023 Reactive (A)  NON REACTIVE Final   Comment: (NOTE) The CDC recommends that a Reactive HCV antibody result be followed up  with a HCV Nucleic Acid Amplification test.     Hep A IgM 08/11/2023 NON REACTIVE  NON REACTIVE Final   Hep B C IgM 08/11/2023 NON REACTIVE  NON REACTIVE Final   Performed at [facility], [address], [place], [place] [address]   Total Protein 08/12/2023 7.4  6.5 - 8.1 g/dL Final   Albumin 91/47/8295 3.2 (L)  3.5 - 5.0 g/dL Final   AST 62/13/0865 381 (H)  15 - 41 U/L Final   ALT 08/12/2023 247 (H)  0 - 44 U/L Final   Alkaline Phosphatase 08/12/2023 97  38 - 126 U/L Final   Total Bilirubin 08/12/2023 1.0  <1.2 mg/dL Final   Bilirubin, Direct 08/12/2023 0.2  0.0 - 0.2 mg/dL Final   Indirect Bilirubin 08/12/2023 0.8  0.3 - 0.9 mg/dL Final   Performed at [facility], [address], [place], [place] [address]   Total CK 08/12/2023 41 (L)  49 - 397 U/L Final   Performed at [facility], [address], [place], [place] [address]   Ammonia 08/12/2023 48 (H)  9 - 35 umol/L Final   Performed at [facility], [address], [place], [place] [address]  Admission on 08/04/2023, Discharged on 08/07/2023  Component Date Value Ref Range Status   Amphetamines, Urine 08/04/2023 Negative  Cutoff=1000 ng/mL Final   Amphetamine test includes Amphetamine and Methamphetamine.   Barbiturate, Ur 08/04/2023 Negative  Cutoff=300 ng/mL Final   Benzodiazepine Quant, Ur 08/04/2023 Negative  Cutoff=300 ng/mL Final   Cannabinoid Quant, Ur 08/04/2023 Negative  Cutoff=50 ng/mL Final   Cocaine (Metab.) 08/04/2023 See Final Results  Cutoff=300 ng/mL Final   Opiate Quant, Ur 08/04/2023 Negative  Cutoff=300 ng/mL Final   Opiate test includes Codeine and Morphine only.   Phencyclidine, Ur 08/04/2023 Negative  Cutoff=25 ng/mL Final   Methadone Screen, Urine 08/04/2023 Negative  Cutoff=300 ng/mL Final   Propoxyphene, Urine 08/04/2023 Negative  Cutoff=300 ng/mL Final   Ethanol U, Cloretta Ned 08/04/2023 See Final Results  Cutoff=0.020 % Final   Comment: (NOTE) Performed At: [facility] [address] [place], [place] [id number] Avis Epley PhD [id number]    WBC 08/04/2023 5.8  4.0 - 10.5 K/uL Final   RBC 08/04/2023 3.93 (L)  4.22 - 5.81 MIL/uL Final   Hemoglobin 08/04/2023 12.9 (L)  13.0 - 17.0 g/dL Final   HCT 95/63/8756 36.4 (L)  39.0 - 52.0 % Final   MCV 08/04/2023 92.6  80.0 - 100.0 fL Final   MCH 08/04/2023 32.8  26.0 - 34.0 pg Final   MCHC 08/04/2023 35.4  30.0 - 36.0 g/dL Final   RDW 43/32/9518 12.7  11.5 - 15.5 % Final   Platelets 08/04/2023 147 (L)  150 - 400 K/uL Final   Comment: SPECIMEN CHECKED FOR CLOTS REPEATED TO VERIFY PLATELET COUNT CONFIRMED BY SMEAR    nRBC 08/04/2023 0.0  0.0 - 0.2 % Final   Neutrophils Relative % 08/04/2023 76  % Final  Neutro Abs 08/04/2023 4.4  1.7 - 7.7 K/uL Final   Lymphocytes Relative 08/04/2023 16  % Final   Lymphs Abs 08/04/2023 0.9  0.7 - 4.0 K/uL Final   Monocytes Relative 08/04/2023 6  % Final   Monocytes Absolute 08/04/2023 0.3  0.1 - 1.0  K/uL Final   Eosinophils Relative 08/04/2023 0  % Final   Eosinophils Absolute 08/04/2023 0.0  0.0 - 0.5 K/uL Final   Basophils Relative 08/04/2023 1  % Final   Basophils Absolute 08/04/2023 0.0  0.0 - 0.1 K/uL Final   Immature Granulocytes 08/04/2023 1  % Final   Abs Immature Granulocytes 08/04/2023 0.04  0.00 - 0.07 K/uL Final   Performed at [facility], [address], [place], [place] [address]   Sodium 08/04/2023 134 (L)  135 - 145 mmol/L Final   Potassium 08/04/2023 3.0 (L)  3.5 - 5.1 mmol/L Final   Chloride 08/04/2023 95 (L)  98 - 111 mmol/L Final   CO2 08/04/2023 24  22 - 32 mmol/L Final   Glucose, Bld 08/04/2023 115 (H)  70 - 99 mg/dL Final   Glucose reference range applies only to samples taken after fasting for at least 8 hours.   BUN 08/04/2023 5 (L)  6 - 20 mg/dL Final   Creatinine, Ser 08/04/2023 0.56 (L)  0.61 - 1.24 mg/dL Final   Calcium 19/14/7829 8.2 (L)  8.9 - 10.3 mg/dL Final   Total Protein 56/21/3086 7.7  6.5 - 8.1 g/dL Final   Albumin 57/84/6962 3.6  3.5 - 5.0 g/dL Final   AST 95/28/4132 205 (H)  15 - 41 U/L Final   ALT 08/04/2023 99 (H)  0 - 44 U/L Final   Alkaline Phosphatase 08/04/2023 98  38 - 126 U/L Final   Total Bilirubin 08/04/2023 1.0  0.3 - 1.2 mg/dL Final   GFR, Estimated 08/04/2023 >60  >60 mL/min Final   Comment: (NOTE) Calculated using the CKD-EPI Creatinine Equation (2021)    Anion gap 08/04/2023 15  5 - 15 Final   Performed at [facility], [address] [place], [place] [address]   Hgb A1c MFr Bld 08/04/2023 5.4  4.8 - 5.6 % Final   Comment: (NOTE) Pre diabetes:          5.7%-6.4%  Diabetes:              >6.4%  Glycemic control for   <7.0% adults with diabetes    Mean Plasma Glucose 08/04/2023 108.28  mg/dL Final   Performed at [facility], [address], [place], [place] [address]   TSH 08/04/2023 0.613  0.350 - 4.500 uIU/mL Final   Comment: Performed by a 3rd Generation assay with a functional sensitivity of  <=0.01 uIU/mL. Performed at [facility], [address] [place], [place] [address]    Neisseria Gonorrhea 08/04/2023 Negative   Final   Chlamydia 08/04/2023 Negative   Final   Comment 08/04/2023 Normal Reference Ranger Chlamydia - Negative   Final   Comment 08/04/2023 Normal Reference Range Neisseria Gonorrhea - Negative   Final   RPR Ser Ql 08/04/2023 NON REACTIVE  NON REACTIVE Final   Performed at [facility], [address] [place], [place] [address]   HIV-1 P24 Antigen - HIV24 08/04/2023 NON REACTIVE  NON REACTIVE Final   Comment: (NOTE) Detection of p24 may be inhibited by biotin in the sample, causing false negative results in acute infection.    HIV  1/2 Antibodies 08/04/2023 NON REACTIVE  NON REACTIVE Final   Interpretation (HIV Ag Ab) 08/04/2023 A non reactive test result means that HIV 1 or HIV 2 antibodies and HIV 1 p24 antigen were not detected in the specimen.   Final   Performed at [facility], [address], [place], [place] [address]   Hepatitis B Surface Ag 08/04/2023 NON REACTIVE  NON REACTIVE Final   HCV Ab 08/04/2023 Reactive (A)  NON REACTIVE Final   Comment: (NOTE) The CDC recommends that a Reactive HCV antibody result be followed up  with a HCV Nucleic Acid Amplification test.     Hep A IgM 08/04/2023 NON REACTIVE  NON REACTIVE Final   Hep B C IgM 08/04/2023 NON REACTIVE  NON REACTIVE Final   Performed at [facility], [address] [place] [address] [place], [place] [address]   POC Amphetamine UR 08/04/2023 None Detected  NONE DETECTED (Cut Off Level 1000 ng/mL) Final   POC Secobarbital (BAR) 08/04/2023 None Detected  NONE DETECTED (Cut Off Level 300 ng/mL) Final   POC Buprenorphine (BUP) 08/04/2023 None Detected  NONE DETECTED (Cut Off Level 10 ng/mL) Final   POC Oxazepam (BZO) 08/04/2023 None Detected  NONE DETECTED (Cut Off Level 300 ng/mL) Final   POC Cocaine UR 08/04/2023 Positive (A)  NONE DETECTED (Cut Off Level 300 ng/mL) Final   POC  Methamphetamine UR 08/04/2023 None Detected  NONE DETECTED (Cut Off Level 1000 ng/mL) Final   POC Morphine 08/04/2023 None Detected  NONE DETECTED (Cut Off Level 300 ng/mL) Final   POC Methadone UR 08/04/2023 None Detected  NONE DETECTED (Cut Off Level 300 ng/mL) Final   POC Oxycodone UR 08/04/2023 None Detected  NONE DETECTED (Cut Off Level 100 ng/mL) Final   POC Marijuana UR 08/04/2023 Positive (A)  NONE DETECTED (Cut Off Level 50 ng/mL) Final   Cocaine Metab Quant, Ur 08/04/2023 Positive (A)  Cutoff=300 Final   Benzoylecgonine GC/MS Conf 08/04/2023 35,080  Cutoff=150 ng/mL Final   Comment: (NOTE) Performed At: [facility] [address] [place], [place] [id number] Avis Epley PhD [id number]    Ethanol, Urine 08/04/2023 FID) (A)  Cutoff=0.020 Final   Comment: Positive Confirmation performed by Gas Chromatography (GC    Ethanol, Ur - Confirmation 08/04/2023 0.099  Cutoff=0.020 % Final   Comment: (NOTE) GLUCOSE NEGATIVE Performed At: [facility] [address] [place], [place] [id number] Avis Epley PhD [id number]   Admission on 06/15/2023, Discharged on 06/16/2023  Component Date Value Ref Range Status   WBC 06/15/2023 5.8  4.0 - 10.5 K/uL Final   RBC 06/15/2023 4.61  4.22 - 5.81 MIL/uL Final   Hemoglobin 06/15/2023 15.5  13.0 - 17.0 g/dL Final   HCT 72/53/6644 46.0  39.0 - 52.0 % Final   MCV 06/15/2023 99.8  80.0 - 100.0 fL Final   MCH 06/15/2023 33.6  26.0 - 34.0 pg Final   MCHC 06/15/2023 33.7  30.0 - 36.0 g/dL Final   RDW 03/47/4259 13.3  11.5 - 15.5 % Final   Platelets 06/15/2023 244  150 - 400 K/uL Final   nRBC 06/15/2023 0.0  0.0 - 0.2 % Final   Neutrophils Relative % 06/15/2023 56  % Final   Neutro Abs 06/15/2023 3.3  1.7 - 7.7 K/uL Final   Lymphocytes Relative 06/15/2023 33  % Final   Lymphs Abs 06/15/2023 1.9  0.7 - 4.0 K/uL Final   Monocytes Relative 06/15/2023 6  % Final   Monocytes Absolute  06/15/2023 0.4  0.1 - 1.0 K/uL Final   Eosinophils  Relative 06/15/2023 2  % Final   Eosinophils Absolute 06/15/2023 0.1  0.0 - 0.5 K/uL Final   Basophils Relative 06/15/2023 3  % Final   Basophils Absolute 06/15/2023 0.2 (H)  0.0 - 0.1 K/uL Final   Immature Granulocytes 06/15/2023 0  % Final   Abs Immature Granulocytes 06/15/2023 0.01  0.00 - 0.07 K/uL Final   Performed at [facility], [address], [place], [place] [address]   Sodium 06/15/2023 140  135 - 145 mmol/L Final   Potassium 06/15/2023 3.8  3.5 - 5.1 mmol/L Final   Chloride 06/15/2023 103  98 - 111 mmol/L Final   CO2 06/15/2023 28  22 - 32 mmol/L Final   Glucose, Bld 06/15/2023 108 (H)  70 - 99 mg/dL Final   Glucose reference range applies only to samples taken after fasting for at least 8 hours.   BUN 06/15/2023 5 (L)  6 - 20 mg/dL Final   Creatinine, Ser 06/15/2023 0.65  0.61 - 1.24 mg/dL Final   Calcium 96/29/5284 8.3 (L)  8.9 - 10.3 mg/dL Final   Total Protein 13/24/4010 8.6 (H)  6.5 - 8.1 g/dL Final   Albumin 27/25/3664 3.6  3.5 - 5.0 g/dL Final   AST 40/34/7425 296 (H)  15 - 41 U/L Final   ALT 06/15/2023 205 (H)  0 - 44 U/L Final   Alkaline Phosphatase 06/15/2023 145 (H)  38 - 126 U/L Final   Total Bilirubin 06/15/2023 0.4  0.3 - 1.2 mg/dL Final   GFR, Estimated 06/15/2023 >60  >60 mL/min Final   Comment: (NOTE) Calculated using the CKD-EPI Creatinine Equation (2021)    Anion gap 06/15/2023 9  5 - 15 Final   Performed at [facility], [address], [place], [place] [address]   Lipase 06/15/2023 27  11 - 51 U/L Final   Performed at [facility], [address], [place], [place] [address]   Specimen Source 06/16/2023 URINE, CLEAN CATCH   Final   Color, Urine 06/16/2023 YELLOW  YELLOW Final   APPearance 06/16/2023 HAZY (A)  CLEAR Final   Specific Gravity, Urine 06/16/2023 1.015  1.005 - 1.030 Final   pH 06/16/2023 6.0  5.0 - 8.0 Final   Glucose, UA 06/16/2023 NEGATIVE  NEGATIVE mg/dL Final   Hgb urine  dipstick 06/16/2023 NEGATIVE  NEGATIVE Final   Bilirubin Urine 06/16/2023 NEGATIVE  NEGATIVE Final   Ketones, ur 06/16/2023 NEGATIVE  NEGATIVE mg/dL Final   Protein, ur 32/95/1884 NEGATIVE  NEGATIVE mg/dL Final   Nitrite 16/60/6301 NEGATIVE  NEGATIVE Final   Leukocytes,Ua 06/16/2023 NEGATIVE  NEGATIVE Final   RBC / HPF 06/16/2023 0-5  0 - 5 RBC/hpf Final   WBC, UA 06/16/2023 0-5  0 - 5 WBC/hpf Final   Comment:        Reflex urine culture not performed if WBC <=10, OR if Squamous epithelial cells >5. If Squamous epithelial cells >5 suggest recollection.    Bacteria, UA 06/16/2023 NONE SEEN  NONE SEEN Final   Squamous Epithelial / HPF 06/16/2023 0-5  0 - 5 /HPF Final   Mucus 06/16/2023 PRESENT   Final   Performed at [facility], [address] [place], [place] [address]   Alcohol, Ethyl (B) 06/15/2023 288 (H)  <10 mg/dL Final   Comment: (NOTE) Lowest detectable limit for serum alcohol is 10 mg/dL.  For medical purposes only. Performed at  [facility], [address] [place], [place] [address]    Salicylate Lvl 06/15/2023 <7.0 (L)  7.0 - 30.0 mg/dL Final   Performed at [facility], [address], [place], [place] [address]   Acetaminophen (Tylenol), Serum 06/15/2023 <10 (L)  10 - 30 ug/mL Final   Comment: (NOTE) Therapeutic concentrations vary significantly. A range of 10-30 ug/mL  may be an effective concentration for many patients. However, some  are best treated at concentrations outside of this range. Acetaminophen concentrations >150 ug/mL at 4 hours after ingestion  and >50 ug/mL at 12 hours after ingestion are often associated with  toxic reactions.  Performed at [facility], [address] [place], [place] [address]    Opiates 06/16/2023 NONE DETECTED  NONE DETECTED Final   Cocaine 06/16/2023 POSITIVE (A)  NONE DETECTED Final   Benzodiazepines 06/16/2023 NONE DETECTED  NONE DETECTED Final    Amphetamines 06/16/2023 NONE DETECTED  NONE DETECTED Final   Tetrahydrocannabinol 06/16/2023 POSITIVE (A)  NONE DETECTED Final   Barbiturates 06/16/2023 NONE DETECTED  NONE DETECTED Final   Comment: (NOTE) DRUG SCREEN FOR MEDICAL PURPOSES ONLY.  IF CONFIRMATION IS NEEDED FOR ANY PURPOSE, NOTIFY LAB WITHIN 5 DAYS.  LOWEST DETECTABLE LIMITS FOR URINE DRUG SCREEN Drug Class                     Cutoff (ng/mL) Amphetamine and metabolites    1000 Barbiturate and metabolites    200 Benzodiazepine                 200 Opiates and metabolites        300 Cocaine and metabolites        300 THC                            50 Performed at [facility], [address], [place], [place] [address]   Admission on 06/09/2023, Discharged on 06/10/2023  Component Date Value Ref Range Status   Lipase 06/09/2023 38  11 - 51 U/L Final   Performed at [facility], [address] [place], [place] [address]   Sodium 06/09/2023 137  135 - 145 mmol/L Final   Potassium 06/09/2023 3.6  3.5 - 5.1 mmol/L Final   Chloride 06/09/2023 100  98 - 111 mmol/L Final   CO2 06/09/2023 25  22 - 32 mmol/L Final   Glucose, Bld 06/09/2023 140 (H)  70 - 99 mg/dL Final   Glucose reference range applies only to samples taken after fasting for at least 8 hours.   BUN 06/09/2023 14  6 - 20 mg/dL Final   Creatinine, Ser 06/09/2023 0.75  0.61 - 1.24 mg/dL Final   Calcium 13/05/6577 8.6 (L)  8.9 - 10.3 mg/dL Final   Total Protein 46/96/2952 8.4 (H)  6.5 - 8.1 g/dL Final   Albumin 84/13/2440 3.6  3.5 - 5.0 g/dL Final   AST 07/30/2535 222 (H)  15 - 41 U/L Final   ALT 06/09/2023 176 (H)  0 - 44 U/L Final   Alkaline Phosphatase 06/09/2023 183 (H)  38 - 126 U/L Final   Total Bilirubin 06/09/2023 0.7  0.3 - 1.2 mg/dL Final   GFR, Estimated 06/09/2023 >60  >60 mL/min Final   Comment: (NOTE) Calculated using the CKD-EPI Creatinine Equation (2021)    Anion gap 06/09/2023 12  5 - 15 Final    Performed  at [facility], [address] [place], [place] [address]   WBC 06/09/2023 10.3  4.0 - 10.5 K/uL Final   RBC 06/09/2023 4.30  4.22 - 5.81 MIL/uL Final   Hemoglobin 06/09/2023 14.7  13.0 - 17.0 g/dL Final   HCT 60/45/4098 42.1  39.0 - 52.0 % Final   MCV 06/09/2023 97.9  80.0 - 100.0 fL Final   MCH 06/09/2023 34.2 (H)  26.0 - 34.0 pg Final   MCHC 06/09/2023 34.9  30.0 - 36.0 g/dL Final   RDW 11/91/4782 12.8  11.5 - 15.5 % Final   Platelets 06/09/2023 315  150 - 400 K/uL Final   nRBC 06/09/2023 0.0  0.0 - 0.2 % Final   Performed at [facility], [address], [place], [place] [address]   Color, Urine 06/10/2023 YELLOW  YELLOW Final   APPearance 06/10/2023 CLEAR  CLEAR Final   Specific Gravity, Urine 06/10/2023 1.010  1.005 - 1.030 Final   pH 06/10/2023 5.0  5.0 - 8.0 Final   Glucose, UA 06/10/2023 NEGATIVE  NEGATIVE mg/dL Final   Hgb urine dipstick 06/10/2023 NEGATIVE  NEGATIVE Final   Bilirubin Urine 06/10/2023 NEGATIVE  NEGATIVE Final   Ketones, ur 06/10/2023 NEGATIVE  NEGATIVE mg/dL Final   Protein, ur 30/86/5784 NEGATIVE  NEGATIVE mg/dL Final   Nitrite 69/62/9528 NEGATIVE  NEGATIVE Final   Leukocytes,Ua 06/10/2023 NEGATIVE  NEGATIVE Final   Performed at [facility], [address], [place], [place] [address]  Admission on 06/09/2023, Discharged on 06/09/2023  Component Date Value Ref Range Status   Opiates 06/09/2023 NONE DETECTED  NONE DETECTED Final   Cocaine 06/09/2023 POSITIVE (A)  NONE DETECTED Final   Benzodiazepines 06/09/2023 NONE DETECTED  NONE DETECTED Final   Amphetamines 06/09/2023 NONE DETECTED  NONE DETECTED Final   Tetrahydrocannabinol 06/09/2023 POSITIVE (A)  NONE DETECTED Final   Barbiturates 06/09/2023 NONE DETECTED  NONE DETECTED Final   Comment: (NOTE) DRUG SCREEN FOR MEDICAL PURPOSES ONLY.  IF CONFIRMATION IS NEEDED FOR ANY PURPOSE, NOTIFY LAB WITHIN 5 DAYS.  LOWEST DETECTABLE  LIMITS FOR URINE DRUG SCREEN Drug Class                     Cutoff (ng/mL) Amphetamine and metabolites    1000 Barbiturate and metabolites    200 Benzodiazepine                 200 Opiates and metabolites        300 Cocaine and metabolites        300 THC                            50 Performed at [facility], [address] [place], [place] [address]    Color, Urine 06/09/2023 YELLOW  YELLOW Final   APPearance 06/09/2023 CLEAR  CLEAR Final   Specific Gravity, Urine 06/09/2023 1.016  1.005 - 1.030 Final   pH 06/09/2023 8.0  5.0 - 8.0 Final   Glucose, UA 06/09/2023 NEGATIVE  NEGATIVE mg/dL Final   Hgb urine dipstick 06/09/2023 NEGATIVE  NEGATIVE Final   Bilirubin Urine 06/09/2023 NEGATIVE  NEGATIVE Final   Ketones, ur 06/09/2023 NEGATIVE  NEGATIVE mg/dL Final   Protein, ur 72/53/6644 NEGATIVE  NEGATIVE mg/dL Final   Nitrite 03/47/4259 NEGATIVE  NEGATIVE Final   Leukocytes,Ua 06/09/2023 NEGATIVE  NEGATIVE Final   Performed at [facility], [address]  [place], [place] [address]  There may be more visits with results that are not included.    Allergies: Codeine  Medications:  PTA Medications  Medication Sig   DULoxetine (CYMBALTA) 30 MG capsule Take 1 capsule (30 mg total) by mouth daily.   gabapentin (NEURONTIN) 300 MG capsule Take 2 capsules (600 mg total) by mouth 3 (three) times daily.   hydrOXYzine (ATARAX) 25 MG tablet Take 1 tablet (25 mg total) by mouth every 6 (six) hours as needed for anxiety.   nicotine (NICODERM CQ - DOSED IN MG/24 HR) 7 mg/24hr patch Place 1 patch (7 mg total) onto the skin daily.   traZODone (DESYREL) 50 MG tablet Take 1 tablet (50 mg total) by mouth at bedtime as needed for sleep.   acetaminophen (TYLENOL) 325 MG tablet Take 2 tablets (650 mg total) by mouth every 6 (six) hours as needed for mild pain (pain score 1-3).   naproxen sodium (ALEVE) 220 MG tablet Take 220 mg by mouth.   predniSONE (DELTASONE) 20  MG tablet Take 2 tablets (40 mg total) by mouth daily with breakfast for 5 days.      Medical Decision Making  Observation unit    Recommendations  Based on my evaluation the patient does not appear to have an emergency medical condition.  Sindy Guadeloupe, NP 12/06/23  8:21 PM

## 2023-12-07 MED ORDER — LORAZEPAM 1 MG PO TABS: 1.0000 mg | ORAL_TABLET | Freq: Four times a day (QID) | ORAL | Status: DC | PRN

## 2023-12-07 MED ORDER — ONDANSETRON 4 MG PO TBDP
4.0000 mg | ORAL_TABLET | Freq: Four times a day (QID) | ORAL | Status: DC | PRN
  Administered 2023-12-07: 4 mg via ORAL
  Filled 2023-12-07: qty 1

## 2023-12-07 MED ORDER — THIAMINE MONONITRATE 100 MG PO TABS: 100.0000 mg | ORAL_TABLET | Freq: Every day | ORAL | Status: DC

## 2023-12-07 MED ORDER — THIAMINE HCL 100 MG/ML IJ SOLN
100.0000 mg | Freq: Once | INTRAMUSCULAR | Status: DC
  Filled 2023-12-07: qty 2

## 2023-12-07 MED ORDER — ADULT MULTIVITAMIN W/MINERALS CH
1.0000 | ORAL_TABLET | Freq: Every day | ORAL | Status: DC
  Administered 2023-12-07: 1 via ORAL
  Filled 2023-12-07: qty 1

## 2023-12-07 MED ORDER — GABAPENTIN 400 MG PO CAPS
400.0000 mg | ORAL_CAPSULE | Freq: Three times a day (TID) | ORAL | Status: DC
  Administered 2023-12-07 (×2): 400 mg via ORAL
  Filled 2023-12-07 (×2): qty 1

## 2023-12-07 NOTE — ED Provider Notes (Addendum)
 FBC/OBS ASAP Discharge Summary  Date and Time: 12/07/2023 5:50 PM  Name: Mark Hammond  MRN:  [id number]   Discharge Diagnoses:  Final diagnoses:  Polysubstance abuse (HCC)  Opioid abuse (HCC)  Other depression    Subjective: Mark Hammond 33 y.o., male patient presented to [facility] as a voluntary walk in on 12/06/23 with complaints of suicidal ideations and wanting to detox from alcohol and crack. PPH of MDD, Polysubstance use d/o, Schizoaffective d/o, suicide attempt and Substance Induced mood d/o. Medical hx of chronic left leg pain for which he was prescribed Gabapentin and Naproxen. He has been medically assessed multiple times for this leg pain. Mark Hammond, is seen face to face by this provider, consulted with Dr. Woodroe Mode and chart reviewed on 12/07/23.    On evaluation Mark Hammond reports "I was having suicidal thoughts last night when I came in but not now. I still feel depressed and down though. And I do want to get detox still and hopefull go to a residential program after that."  Patient denies current suicidal ideations but does endorse depressive symptoms including: Sadness, hopelessness, helplessness, and trouble sleeping. He reports having withdrawal symptoms including nausea, pain and "feeling shaky" no tremor observed at time of assessment. We discussed plan to remain in observation until a bed opens at [facility], which pt agrees to. UDS resulted positive for cocaine, benzodiazepines, and THC.   During evaluation Mark Hammond is lying in bed ,in no acute distress.  He is alert & oriented x 4, calm, cooperative and attentive for this assessment.  His mood is depressed with congruent affect.  He has normal speech, and behavior.  Objectively there is no evidence of psychosis/mania or delusional thinking. Pt does not appear to be responding to internal or external stimuli.  Patient is able to converse coherently, goal directed thoughts, no distractibility, or pre-occupation.  He also denies  suicidal/self-harm/homicidal ideation, psychosis, and paranoia.  Patient answered question appropriately.    Stay Summary: 12/06/2023   Mark Guadeloupe, NP                 Mark Hammond, 33 y/o male with a history of schizoaffective disorder, polysubstance abuse, MDD, alcohol intoxication, intentional overdose.  Presented to [facility] voluntarily.  Per the patient he needs to get rehab for crack cocaine, opioid abuse, and also alcohol according to the patient he last used yesterday.  Per the patient he is trying to get into [facility] but they will not accept him until he comes here.  I review of patient records show patient was last seen here at [facility] 2 weeks ago and after being here he was discharged and was supposed to follow up with outpatient services but he did not.  According to patient he is currently not seeing a psychiatrist or therapist, lives with his girlfriend currently unemployed.Face-to-face observation of patient, patient is alert and oriented x 4, speech is clear, maintain eye contact.  Patient is very nonchalant when talking as if he does not care about anything.  Patient denies SI, HI, AVH or paranoia.  Reports crack cocaine use and also opioid medication use within the last 24 hours.  Patient does not seem to be in any immediate distress at this time does not seem to be a risk to himself or others.  Writer discussed with patient that he needs to stick with the program whenever he is admitted because he keeps doing it over and over after being discharged.  Patient is in agreement with  plan of care.Recommend observation.  Total Time spent with patient: 20 minutes  Past Psychiatric History: MDD, Polysubstance use d/o, schizoaffective d/o, suicide attempt and substance induced mood d/o.  Past Medical History: Left leg pain  Family History: No reported family history Family Psychiatric  History: No reported family psych history Social History: Pt currently lives with girlfriend and is unemployed.     Additional Social History:  Pain Medications: see MAR Prescriptions: see MAR Over the Counter: see MAR History of alcohol / drug use?: Yes Longest period of sobriety (when/how long): Unable to Quantify Negative Consequences of Use: Financial, Legal, Personal relationships, Work / School Withdrawal Symptoms: Irritability Name of Substance 1: crack/cocaine 1 - Age of First Use: 15 1 - Amount (size/oz): 2.5 grams 1 - Frequency: daily 1 - Method of Aquiring: uta Name of Substance 2: alcohol 2 - Age of First Use: 15 2 - Amount (size/oz): 3x  40 ounces 2 - Frequency: daily 2 - Method of Aquiring: uta Name of Substance 3: meth 3 - Age of First Use: 15 3 - Amount (size/oz): "hit or two" 3 - Frequency: daily 3 - Method of Aquiring: uta Tobacco Cessation:  A prescription for an FDA-approved tobacco cessation medication provided at discharge  Current Medications:  Current Facility-Administered Medications  Medication Dose Route Frequency Provider Last Rate Last Admin   acetaminophen (TYLENOL) tablet 650 mg  650 mg Oral Q6H PRN Mark Guadeloupe, NP   650 mg at 12/07/23 1118   alum & mag hydroxide-simeth (MAALOX/MYLANTA) 200-200-20 MG/5ML suspension 30 mL  30 mL Oral Q4H PRN Mark Guadeloupe, NP       dicyclomine (BENTYL) tablet 20 mg  20 mg Oral Q6H PRN Mark Guadeloupe, NP       gabapentin (NEURONTIN) capsule 400 mg  400 mg Oral TID Gaylyn Rong C, NP   400 mg at 12/07/23 1607   hydrOXYzine (ATARAX) tablet 25 mg  25 mg Oral Q6H PRN Mark Guadeloupe, NP   25 mg at 12/06/23 2153   loperamide (IMODIUM) capsule 2-4 mg  2-4 mg Oral PRN Mark Guadeloupe, NP       LORazepam (ATIVAN) tablet 1 mg  1 mg Oral Q6H PRN Howie Ill, NP       magnesium hydroxide (MILK OF MAGNESIA) suspension 30 mL  30 mL Oral Daily PRN Mark Guadeloupe, NP       methocarbamol (ROBAXIN) tablet 500 mg  500 mg Oral Q8H PRN Mark Guadeloupe, NP   500 mg at 12/07/23 1607   multivitamin with minerals tablet 1 tablet  1 tablet Oral Daily  Gaylyn Rong C, NP   1 tablet at 12/07/23 1101   naproxen (NAPROSYN) tablet 500 mg  500 mg Oral BID PRN Mark Guadeloupe, NP   500 mg at 12/07/23 1649   OLANZapine (ZYPREXA) injection 10 mg  10 mg Intramuscular TID PRN Mark Guadeloupe, NP       OLANZapine (ZYPREXA) injection 5 mg  5 mg Intramuscular TID PRN Mark Guadeloupe, NP       OLANZapine zydis (ZYPREXA) disintegrating tablet 5 mg  5 mg Oral TID PRN Mark Guadeloupe, NP       ondansetron (ZOFRAN-ODT) disintegrating tablet 4 mg  4 mg Oral Q6H PRN Gaylyn Rong C, NP   4 mg at 12/07/23 1650   thiamine (VITAMIN B1) injection 100 mg  100 mg Intramuscular Once Howie Ill, NP       [START ON 12/08/2023] thiamine (VITAMIN B1) tablet 100 mg  100 mg  Oral Daily Howie Ill, NP       Current Outpatient Medications  Medication Sig Dispense Refill   acetaminophen (TYLENOL) 325 MG tablet Take 2 tablets (650 mg total) by mouth every 6 (six) hours as needed for mild pain (pain score 1-3).     gabapentin (NEURONTIN) 300 MG capsule Take 2 capsules (600 mg total) by mouth 3 (three) times daily. 180 capsule 0   hydrOXYzine (ATARAX) 25 MG tablet Take 1 tablet (25 mg total) by mouth every 6 (six) hours as needed for anxiety. 30 tablet 0   naproxen sodium (ALEVE) 220 MG tablet Take 220 mg by mouth.     nicotine (NICODERM CQ - DOSED IN MG/24 HR) 7 mg/24hr patch Place 1 patch (7 mg total) onto the skin daily. 28 patch 0   predniSONE (DELTASONE) 20 MG tablet Take 2 tablets (40 mg total) by mouth daily with breakfast for 5 days. 10 tablet 0   traZODone (DESYREL) 50 MG tablet Take 1 tablet (50 mg total) by mouth at bedtime as needed for sleep. 30 tablet 0   DULoxetine (CYMBALTA) 30 MG capsule Take 1 capsule (30 mg total) by mouth daily. (Patient not taking: Reported on 12/07/2023) 30 capsule 0    PTA Medications:  Facility Ordered Medications  Medication   acetaminophen (TYLENOL) tablet 650 mg   alum & mag hydroxide-simeth (MAALOX/MYLANTA) 200-200-20 MG/5ML suspension  30 mL   magnesium hydroxide (MILK OF MAGNESIA) suspension 30 mL   dicyclomine (BENTYL) tablet 20 mg   hydrOXYzine (ATARAX) tablet 25 mg   loperamide (IMODIUM) capsule 2-4 mg   methocarbamol (ROBAXIN) tablet 500 mg   naproxen (NAPROSYN) tablet 500 mg   OLANZapine zydis (ZYPREXA) disintegrating tablet 5 mg   OLANZapine (ZYPREXA) injection 5 mg   OLANZapine (ZYPREXA) injection 10 mg   thiamine (VITAMIN B1) injection 100 mg   [START ON 12/08/2023] thiamine (VITAMIN B1) tablet 100 mg   multivitamin with minerals tablet 1 tablet   LORazepam (ATIVAN) tablet 1 mg   ondansetron (ZOFRAN-ODT) disintegrating tablet 4 mg   gabapentin (NEURONTIN) capsule 400 mg   PTA Medications  Medication Sig   gabapentin (NEURONTIN) 300 MG capsule Take 2 capsules (600 mg total) by mouth 3 (three) times daily.   hydrOXYzine (ATARAX) 25 MG tablet Take 1 tablet (25 mg total) by mouth every 6 (six) hours as needed for anxiety.   nicotine (NICODERM CQ - DOSED IN MG/24 HR) 7 mg/24hr patch Place 1 patch (7 mg total) onto the skin daily.   traZODone (DESYREL) 50 MG tablet Take 1 tablet (50 mg total) by mouth at bedtime as needed for sleep.   acetaminophen (TYLENOL) 325 MG tablet Take 2 tablets (650 mg total) by mouth every 6 (six) hours as needed for mild pain (pain score 1-3).   naproxen sodium (ALEVE) 220 MG tablet Take 220 mg by mouth.   predniSONE (DELTASONE) 20 MG tablet Take 2 tablets (40 mg total) by mouth daily with breakfast for 5 days.   DULoxetine (CYMBALTA) 30 MG capsule Take 1 capsule (30 mg total) by mouth daily. (Patient not taking: Reported on 12/07/2023)       11/25/2023    1:57 PM 11/21/2023   10:43 PM 06/05/2023   10:14 AM  Depression screen PHQ 2/9  Decreased Interest 1 3 0  Down, Depressed, Hopeless 1 3 0  PHQ - 2 Score 2 6 0  Altered sleeping 1 2   Tired, decreased energy 1 2   Change in appetite  1 2   Feeling bad or failure about yourself  1 3   Trouble concentrating 1 1   Moving slowly or  fidgety/restless 1 3   Suicidal thoughts 0 3   PHQ-9 Score 8 22     Flowsheet Row ED from 12/06/2023 in [facility] ED from 12/04/2023 in [facility] Emergency Department at [facility] ED from 11/27/2023 in [facility] Emergency Department at [facility]  C-SSRS RISK CATEGORY No Risk High Risk High Risk       Musculoskeletal  Strength & Muscle Tone: within normal limits Gait & Station:  Pt currently using walker due to leg pain.  Patient leans: N/A  Psychiatric Specialty Exam  Presentation  General Appearance:  Appropriate for Environment  Eye Contact: Good  Speech: Clear and Coherent  Speech Volume: Normal  Handedness: Right   Mood and Affect  Mood: Depressed  Affect: Depressed   Thought Process  Thought Processes: Coherent; Linear  Descriptions of Associations:Intact  Orientation:Full (Time, Place and Person)  Thought Content:WDL  Diagnosis of Schizophrenia or Schizoaffective disorder in past: No    Hallucinations:Hallucinations: None  Ideas of Reference:None  Suicidal Thoughts:Suicidal Thoughts: No  Homicidal Thoughts:Homicidal Thoughts: No   Sensorium  Memory: Immediate Good; Recent Good  Judgment: Fair  Insight: Fair   Art therapist  Concentration: Fair  Attention Span: Fair  Recall: Fair  Fund of Knowledge: Fair  Language: Good   Psychomotor Activity  Psychomotor Activity: Psychomotor Activity: Normal   Assets  Assets: Desire for Improvement; Housing; Physical Health; Social Support; Advertising copywriter; Manufacturing systems engineer; Resilience   Sleep  Sleep: Sleep: Fair Number of Hours of Sleep: 6   Nutritional Assessment (For OBS and FBC admissions only) Has the patient had a weight loss or gain of 10 pounds or more in the last 3 months?: No Has the patient had a decrease in food intake/or appetite?: No Does the patient have dental problems?: No Does the  patient have eating habits or behaviors that may be indicators of an eating disorder including binging or inducing vomiting?: No Has the patient recently lost weight without trying?: 0 Has the patient been eating poorly because of a decreased appetite?: 0 Malnutrition Screening Tool Score: 0    Physical Exam  Physical Exam Vitals and nursing note reviewed.  HENT:     Head: Normocephalic.     Nose: Nose normal.  Eyes:     Extraocular Movements: Extraocular movements intact.  Cardiovascular:     Rate and Rhythm: Normal rate.  Pulmonary:     Effort: Pulmonary effort is normal.  Musculoskeletal:     Cervical back: Normal range of motion.     Comments: Pain in left leg  Neurological:     General: No focal deficit present.     Mental Status: He is alert and oriented to person, place, and time.    Review of Systems  Constitutional: Negative.   HENT: Negative.    Eyes: Negative.   Respiratory: Negative.    Cardiovascular: Negative.   Gastrointestinal: Negative.   Genitourinary: Negative.   Musculoskeletal:        Pain in left leg  Neurological: Negative.   Endo/Heme/Allergies: Negative.   Psychiatric/Behavioral:  Positive for depression and substance abuse.    Blood pressure 105/69, pulse 73, temperature 98 F (36.7 C), temperature source Oral, resp. rate 18, SpO2 95%. There is no height or weight on file to calculate BMI.   Plan Of Care/Follow-up recommendations:  Pt  is appropriate for treatment at [facility] for detox. UDS + cocaine, benzodiazepines and THC. Pt also endorses depressed mood with passive suicidal ideations as recent as yesterday. He agrees to wait in observation until bed at [facility] becomes available. Pt remains voluntary at this time.    New Orders Placed:  -Pt was restarted on Gabapentin 400 mg TID for nerve pain in left leg.  -COWS discontinued and initiated CIWA protocol to target alcohol and benzodiazapine withdrawal symptoms.  -Admission  labs ordered Lab Orders         SARS Coronavirus 2 by RT PCR (hospital order, performed in [facility] hospital lab) *cepheid single result test* Anterior Nasal Swab         Ethanol         CBC with Differential         Comprehensive metabolic panel         Lipid panel         Magnesium         TSH         Hemoglobin A1c         POCT Urine Drug Screen - (I-Screen)       Disposition: Pt to be transferred to [facility].   Howie Ill, NP 12/07/2023, 5:50 PM

## 2023-12-07 NOTE — ED Notes (Signed)
 Patient observed/assessed in bed/chair resting quietly appearing in no distress and verbalizing no complaints at this time. Will continue to monitor.

## 2023-12-07 NOTE — Discharge Instructions (Signed)
 Transfer to [facility]

## 2023-12-07 NOTE — ED Notes (Signed)
 Patient resting in lounger. Calm, collected, no physical complaints at this time. Patient in no apparent acute distress. Environment secured. Safety checks in place per facility protocol.

## 2023-12-07 NOTE — ED Notes (Signed)
 Patient resting in lounger with eyes closed, respirations even and unlabored. Patient in no apparent acute distress. Environment secured. Safety checks in place per facility protocol.

## 2023-12-08 ENCOUNTER — Other Ambulatory Visit (HOSPITAL_COMMUNITY)
Admission: EM | Admit: 2023-12-08 | Discharge: 2023-12-12 | Disposition: A | Discharge status: Home / Self Care | Payer: MEDICAID | Attending: Psychiatry | Admitting: Psychiatry

## 2023-12-08 DIAGNOSIS — F1994 Other psychoactive substance use, unspecified with psychoactive substance-induced mood disorder: Secondary | ICD-10-CM

## 2023-12-08 DIAGNOSIS — R262 Difficulty in walking, not elsewhere classified: Secondary | ICD-10-CM | POA: Insufficient documentation

## 2023-12-08 DIAGNOSIS — F1920 Other psychoactive substance dependence, uncomplicated: Secondary | ICD-10-CM

## 2023-12-08 DIAGNOSIS — F1929 Other psychoactive substance dependence with unspecified psychoactive substance-induced disorder: Secondary | ICD-10-CM | POA: Insufficient documentation

## 2023-12-08 DIAGNOSIS — F1510 Other stimulant abuse, uncomplicated: Secondary | ICD-10-CM | POA: Diagnosis not present

## 2023-12-08 DIAGNOSIS — F1120 Opioid dependence, uncomplicated: Secondary | ICD-10-CM

## 2023-12-08 DIAGNOSIS — G8921 Chronic pain due to trauma: Secondary | ICD-10-CM | POA: Diagnosis not present

## 2023-12-08 DIAGNOSIS — Z5900 Homelessness unspecified: Secondary | ICD-10-CM | POA: Insufficient documentation

## 2023-12-08 DIAGNOSIS — F1090 Alcohol use, unspecified, uncomplicated: Secondary | ICD-10-CM

## 2023-12-08 DIAGNOSIS — F332 Major depressive disorder, recurrent severe without psychotic features: Secondary | ICD-10-CM | POA: Insufficient documentation

## 2023-12-08 DIAGNOSIS — F1910 Other psychoactive substance abuse, uncomplicated: Secondary | ICD-10-CM | POA: Diagnosis present

## 2023-12-08 DIAGNOSIS — F1590 Other stimulant use, unspecified, uncomplicated: Secondary | ICD-10-CM

## 2023-12-08 DIAGNOSIS — F17200 Nicotine dependence, unspecified, uncomplicated: Secondary | ICD-10-CM | POA: Insufficient documentation

## 2023-12-08 LAB — COMPREHENSIVE METABOLIC PANEL
ALT: 55 U/L — ABNORMAL HIGH (ref 0–44)
AST: 101 U/L — ABNORMAL HIGH (ref 15–41)
Albumin: 2.3 g/dL — ABNORMAL LOW (ref 3.5–5.0)
Alkaline Phosphatase: 125 U/L (ref 38–126)
Anion gap: 12 (ref 5–15)
BUN: 9 mg/dL (ref 6–20)
CO2: 27 mmol/L (ref 22–32)
Calcium: 8.7 mg/dL — ABNORMAL LOW (ref 8.9–10.3)
Chloride: 100 mmol/L (ref 98–111)
Creatinine, Ser: 0.6 mg/dL — ABNORMAL LOW (ref 0.61–1.24)
GFR, Estimated: >60 mL/min (ref >60)
Glucose, Bld: 114 mg/dL — ABNORMAL HIGH (ref 70–99)
Potassium: 4 mmol/L (ref 3.5–5.1)
Sodium: 139 mmol/L (ref 135–145)
Total Bilirubin: 0.4 mg/dL (ref 0.0–1.2)
Total Protein: 6.2 g/dL — ABNORMAL LOW (ref 6.5–8.1)

## 2023-12-08 LAB — CBC WITH DIFFERENTIAL/PLATELET
Abs Immature Granulocytes: 0.02 10*3/uL (ref 0.00–0.07)
Basophils Absolute: 0.1 10*3/uL (ref 0.0–0.1)
Basophils Relative: 1 %
Eosinophils Absolute: 0.1 10*3/uL (ref 0.0–0.5)
Eosinophils Relative: 3 %
HCT: 32.6 % — ABNORMAL LOW (ref 39.0–52.0)
Hemoglobin: 11.2 g/dL — ABNORMAL LOW (ref 13.0–17.0)
Immature Granulocytes: 1 %
Lymphocytes Relative: 27 %
Lymphs Abs: 1.2 10*3/uL (ref 0.7–4.0)
MCH: 31.7 pg (ref 26.0–34.0)
MCHC: 34.4 g/dL (ref 30.0–36.0)
MCV: 92.4 fL (ref 80.0–100.0)
Monocytes Absolute: 0.4 10*3/uL (ref 0.1–1.0)
Monocytes Relative: 9 %
Neutro Abs: 2.6 10*3/uL (ref 1.7–7.7)
Neutrophils Relative %: 59 %
Platelets: 280 10*3/uL (ref 150–400)
RBC: 3.53 MIL/uL — ABNORMAL LOW (ref 4.22–5.81)
RDW: 13.9 % (ref 11.5–15.5)
WBC: 4.4 10*3/uL (ref 4.0–10.5)
nRBC: 0 % (ref 0.0–0.2)

## 2023-12-08 LAB — RAPID URINE DRUG SCREEN, HOSP PERFORMED
Amphetamines: NOT DETECTED
Barbiturates: NOT DETECTED
Benzodiazepines: POSITIVE — AB
Cocaine: NOT DETECTED
Opiates: NOT DETECTED
Tetrahydrocannabinol: POSITIVE — AB

## 2023-12-08 LAB — LIPID PANEL
Cholesterol: 96 mg/dL (ref 0–200)
HDL: 34 mg/dL — ABNORMAL LOW (ref >40)
LDL Cholesterol: 51 mg/dL (ref 0–99)
Total CHOL/HDL Ratio: 2.8 ratio
Triglycerides: 55 mg/dL (ref <150)
VLDL: 11 mg/dL (ref 0–40)

## 2023-12-08 LAB — HEMOGLOBIN A1C
Hgb A1c MFr Bld: 6 % — ABNORMAL HIGH (ref 4.8–5.6)
Mean Plasma Glucose: 125.5 mg/dL

## 2023-12-08 LAB — MAGNESIUM: Magnesium: 1.7 mg/dL (ref 1.7–2.4)

## 2023-12-08 LAB — TSH: TSH: 0.714 u[IU]/mL (ref 0.350–4.500)

## 2023-12-08 MED ORDER — HYDROXYZINE HCL 25 MG PO TABS
25.0000 mg | ORAL_TABLET | Freq: Four times a day (QID) | ORAL | Status: AC | PRN
  Administered 2023-12-10: 25 mg via ORAL
  Filled 2023-12-08: qty 1

## 2023-12-08 MED ORDER — METHOCARBAMOL 500 MG PO TABS
500.0000 mg | ORAL_TABLET | Freq: Three times a day (TID) | ORAL | Status: DC | PRN
  Administered 2023-12-08 – 2023-12-09 (×3): 500 mg via ORAL
  Filled 2023-12-08 (×3): qty 1

## 2023-12-08 MED ORDER — ADULT MULTIVITAMIN W/MINERALS CH
1.0000 | ORAL_TABLET | Freq: Every day | ORAL | Status: DC
  Administered 2023-12-08 – 2023-12-12 (×5): 1 via ORAL
  Filled 2023-12-08 (×5): qty 1

## 2023-12-08 MED ORDER — DICYCLOMINE HCL 20 MG PO TABS
20.0000 mg | ORAL_TABLET | Freq: Four times a day (QID) | ORAL | Status: DC | PRN
  Administered 2023-12-10 (×2): 20 mg via ORAL
  Filled 2023-12-08 (×2): qty 1

## 2023-12-08 MED ORDER — CLONIDINE HCL 0.1 MG PO TABS: 0.1000 mg | ORAL_TABLET | Freq: Every day | ORAL | Status: DC

## 2023-12-08 MED ORDER — LORAZEPAM 2 MG/ML IJ SOLN: 2.0000 mg | Freq: Three times a day (TID) | INTRAMUSCULAR | Status: DC | PRN

## 2023-12-08 MED ORDER — NAPROXEN 500 MG PO TABS
500.0000 mg | ORAL_TABLET | Freq: Two times a day (BID) | ORAL | Status: DC | PRN
  Administered 2023-12-09 – 2023-12-11 (×4): 500 mg via ORAL
  Filled 2023-12-08 (×4): qty 1

## 2023-12-08 MED ORDER — THIAMINE MONONITRATE 100 MG PO TABS
100.0000 mg | ORAL_TABLET | Freq: Every day | ORAL | Status: DC
  Administered 2023-12-08 – 2023-12-12 (×5): 100 mg via ORAL
  Filled 2023-12-08 (×5): qty 1

## 2023-12-08 MED ORDER — GABAPENTIN 400 MG PO CAPS
400.0000 mg | ORAL_CAPSULE | Freq: Three times a day (TID) | ORAL | Status: DC
Start: 2023-12-08 — End: 2023-12-12
  Administered 2023-12-08 – 2023-12-12 (×12): 400 mg via ORAL
  Filled 2023-12-08 (×3): qty 1
  Filled 2023-12-08: qty 4
  Filled 2023-12-08 (×9): qty 1

## 2023-12-08 MED ORDER — MAGNESIUM HYDROXIDE 400 MG/5ML PO SUSP: 30.0000 mL | Freq: Every day | ORAL | Status: DC | PRN

## 2023-12-08 MED ORDER — DIPHENHYDRAMINE HCL 50 MG/ML IJ SOLN: 50.0000 mg | Freq: Three times a day (TID) | INTRAMUSCULAR | Status: DC | PRN

## 2023-12-08 MED ORDER — HALOPERIDOL 5 MG PO TABS: 5.0000 mg | ORAL_TABLET | Freq: Three times a day (TID) | ORAL | Status: DC | PRN

## 2023-12-08 MED ORDER — NICOTINE POLACRILEX 2 MG MT GUM
2.0000 mg | CHEWING_GUM | OROMUCOSAL | Status: DC | PRN
Start: 2023-12-08 — End: 2023-12-12

## 2023-12-08 MED ORDER — DIPHENHYDRAMINE HCL 50 MG PO CAPS: 50.0000 mg | ORAL_CAPSULE | Freq: Three times a day (TID) | ORAL | Status: DC | PRN

## 2023-12-08 MED ORDER — LORAZEPAM 1 MG PO TABS: 1.0000 mg | ORAL_TABLET | Freq: Two times a day (BID) | ORAL | Status: DC

## 2023-12-08 MED ORDER — CLONIDINE HCL 0.1 MG PO TABS
0.1000 mg | ORAL_TABLET | Freq: Four times a day (QID) | ORAL | Status: DC
  Administered 2023-12-08: 0.1 mg via ORAL
  Filled 2023-12-08: qty 1

## 2023-12-08 MED ORDER — TRAZODONE HCL 50 MG PO TABS
50.0000 mg | ORAL_TABLET | Freq: Every evening | ORAL | Status: DC | PRN
  Administered 2023-12-08 – 2023-12-11 (×4): 50 mg via ORAL
  Filled 2023-12-08 (×4): qty 1

## 2023-12-08 MED ORDER — LORAZEPAM 1 MG PO TABS: 1.0000 mg | ORAL_TABLET | Freq: Every day | ORAL | Status: DC

## 2023-12-08 MED ORDER — LORAZEPAM 1 MG PO TABS: 1.0000 mg | ORAL_TABLET | Freq: Three times a day (TID) | ORAL | Status: DC

## 2023-12-08 MED ORDER — LOPERAMIDE HCL 2 MG PO CAPS: 2.0000 mg | ORAL_CAPSULE | ORAL | Status: AC | PRN

## 2023-12-08 MED ORDER — HALOPERIDOL LACTATE 5 MG/ML IJ SOLN: 5.0000 mg | Freq: Three times a day (TID) | INTRAMUSCULAR | Status: DC | PRN

## 2023-12-08 MED ORDER — LORAZEPAM 1 MG PO TABS
1.0000 mg | ORAL_TABLET | Freq: Four times a day (QID) | ORAL | Status: DC
  Administered 2023-12-08: 1 mg via ORAL
  Filled 2023-12-08: qty 1

## 2023-12-08 MED ORDER — THIAMINE HCL 100 MG/ML IJ SOLN
100.0000 mg | Freq: Once | INTRAMUSCULAR | Status: AC
  Administered 2023-12-08: 100 mg via INTRAMUSCULAR
  Filled 2023-12-08: qty 2

## 2023-12-08 MED ORDER — ALUM & MAG HYDROXIDE-SIMETH 200-200-20 MG/5ML PO SUSP: 30.0000 mL | ORAL | Status: DC | PRN

## 2023-12-08 MED ORDER — HALOPERIDOL LACTATE 5 MG/ML IJ SOLN: 10.0000 mg | Freq: Three times a day (TID) | INTRAMUSCULAR | Status: DC | PRN

## 2023-12-08 MED ORDER — CLONIDINE HCL 0.1 MG PO TABS: 0.1000 mg | ORAL_TABLET | ORAL | Status: DC

## 2023-12-08 MED ORDER — LORAZEPAM 1 MG PO TABS: 1.0000 mg | ORAL_TABLET | Freq: Four times a day (QID) | ORAL | Status: AC | PRN

## 2023-12-08 MED ORDER — ACETAMINOPHEN 325 MG PO TABS
650.0000 mg | ORAL_TABLET | Freq: Four times a day (QID) | ORAL | Status: DC | PRN
  Administered 2023-12-08 – 2023-12-12 (×5): 650 mg via ORAL
  Filled 2023-12-08 (×5): qty 2

## 2023-12-08 MED ORDER — ONDANSETRON 4 MG PO TBDP: 4.0000 mg | ORAL_TABLET | Freq: Four times a day (QID) | ORAL | Status: AC | PRN

## 2023-12-08 NOTE — ED Notes (Signed)
 Patient is resting in bed with eyes closed without any distress noted. Staff will continue to monitor safety and for changes in condition.

## 2023-12-08 NOTE — ED Notes (Signed)
 Patient arrived to the unit with walker and assisted by MHT without any distress noted. He is wearing his personal shirt and paper scrubs. He reports anxiety 4, depression 8, and denies SIHI and AVH. Staff will continue to monitor safety and for changes in condition.

## 2023-12-08 NOTE — Group Note (Signed)
 Group Topic: Recovery Basics  Group Date: 12/08/2023 Start Time: 1000 End Time: 1040 Facilitators: Vicki Mallet, NT  Department: [facility]  Number of Participants: 8  Group Focus: goals/reality orientation Treatment Modality:  Psychoeducation Interventions utilized were patient education Purpose: reinforce self-care  Name: Teandre Hamre Date of Birth: 1990/11/04  MR: [id number]    Level of Participation: DID NOT ATTEND Quality of Participation:  Interactions with others:  Mood/Affect:  Triggers (if applicable):  Cognition:  Progress:  Response:  Plan: follow-up needed  Patients Problems:  Patient Active Problem List   Diagnosis Date Noted   Polysubstance abuse (HCC) 11/21/2023   Alcohol use disorder 08/04/2023   Stimulant use disorder 08/04/2023   Opioid use disorder 08/04/2023   Tobacco use disorder 08/04/2023   Cannabis use disorder 08/04/2023   Hepatitis C 03/01/2023   Anxiety state 01/14/2023   Insomnia 01/14/2023   Schizoaffective disorder (HCC) 01/13/2023   Schizophrenia (HCC) 11/15/2019   Polysubstance (including opioids) dependence, daily use (HCC)    Substance induced mood disorder (HCC) 10/24/2019   Methamphetamine use disorder, 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

## 2023-12-08 NOTE — Progress Notes (Signed)
 Pt is awake, alert and oriented X3. Pt complained of left leg pain. No signs of acute distress noted. Pt is ambulating with a walker. PRN Acetaminophen and scheduled meds administered per order. Pt denies current SI/HI/AVH, plan or intent. Staff will monitor for pt's safety.

## 2023-12-08 NOTE — Group Note (Signed)
 Group Topic: Core Beliefs  Group Date: 12/08/2023 Start Time: 0145 End Time: 0245 Facilitators: Loleta Dicker, LCSW  Department: [facility]  Number of Participants: 5  Group Focus: check in and clarity of thought Treatment Modality:  Cognitive Behavioral Therapy Interventions utilized were exploration, story telling, and support Purpose: improve communication skills  Name: Mark Hammond Date of Birth: 26-Apr-1991  MR: [id number]    Level of Participation: Patient did not participate in group on today. Patient has been encouraged to participate in all programming on the unit.   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

## 2023-12-08 NOTE — Tx Team (Signed)
 LCSW and Resident met with patient to assess current mood, affect, physical state, and inquire about needs/goals while here in [facility] and after discharge. Patient reports he presented due to needing to detox and then be reconsidered for [facility] program. Patient reports having a recent admission at the [facility], and reports he was discharged to [facility] but not accepted due to not having his own assistive device. Patient reports he immediately left their facility and returned to use. Patient reports he has been using opioids, cocaine, alcohol, and marijuana. Patient reports he has been using said substances since the age of 44. Patient denies any IV drugs use and reports he snorts about $40 of cocaine per day. Patient reports he drinks a 5th of liquor, a 40 ounce beer, two 16 ounce beers, and about two cartons of wine daily. Patient reports daily marijuana use stating he helps him cut down on his other substance use. Patient reports he takes about 4 oxy pills per day and the dosage is unknown. Patient reports the only treatment he has ever had was here at the [facility] a few weeks ago. Patient denies having any outpatient follow up. Patient denies any legal charges or upcoming court dates. Patient reports he is not working or receiving any income at this time. Patient reports he lives at home with his girlfriend and reports having support from her. Patient reports current SI with thoughts to overdose. Patient then reported "I keep trying but I keep being brought back here". Patient has had three prior suicide attempts with the latest being in August of last year. Patient reports he attempted to overdose on Fentanyl. Patient reports his prior attempts to overdose happened around the age of 49 and 28. Brief counseling was provided to the patient and he was informed that [facility] would not accept him being actively suicidal. Patient reports that is the reason why he is at the [facility], to get his thoughts handled before  he goes. Patient reports an interest in being considered at facilities other than [facility] as needed. Patient aware that LCSW will send referrals out for review and will follow up to provide updates as received. Patient expressed understanding and appreciation of LCSW assistance. No other needs were reported at this time by patient.   Referral has been sent to [facility] and [facility] for review. LCSW will continue to follow and provide support to patient while on [facility]BC unit.   [name], LCSW Clinical Social Worker [facility] Ph: [phone]

## 2023-12-08 NOTE — Group Note (Signed)
 Group Topic: Positive Affirmations  Group Date: 12/08/2023 Start Time: 0800 End Time: 2100 Facilitators: Quinn Axe, NT  Department: [facility]  Number of Participants: 8  Group Focus: affirmation Treatment Modality:  Cognitive Behavioral Therapy and Spiritual Interventions utilized were clarification and story telling Purpose: enhance coping skills, express feelings, increase insight, regain self-worth, and reinforce self-care  Name: Mark Hammond Date of Birth: 26-Oct-1990  MR: [id number]    Level of Participation: not present Quality of Participation: n/a Interactions with others: n/a Mood/Affect: n/a Triggers (if applicable): n/a Cognition: n/a Progress: None Response: n/a Plan: patient will be encouraged to attend all scheduled activities on the unit  Patients Problems:  Patient Active Problem List   Diagnosis Date Noted   Polysubstance abuse (HCC) 11/21/2023   Alcohol use disorder 08/04/2023   Stimulant use disorder 08/04/2023   Opioid use disorder 08/04/2023   Tobacco use disorder 08/04/2023   Cannabis use disorder 08/04/2023   Hepatitis C 03/01/2023   Anxiety state 01/14/2023   Insomnia 01/14/2023   Schizoaffective disorder (HCC) 01/13/2023   Schizophrenia (HCC) 11/15/2019   Polysubstance (including opioids) dependence, daily use (HCC)    Substance induced mood disorder (HCC) 10/24/2019   Methamphetamine use disorder, 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

## 2023-12-08 NOTE — ED Provider Notes (Signed)
 Facility Based Crisis Admission H&P  Date: 12/08/23 Patient Name: Mark Hammond MRN: [id number] Chief Complaint: Depression, substance use  Diagnoses:  Final diagnoses:  Polysubstance (including opioids) dependence, daily use (HCC)  Stimulant use disorder  Tobacco use disorder  Substance induced mood disorder (HCC)  Severe episode of recurrent major depressive disorder, without psychotic features (HCC)  Chronic pain due to trauma  Ambulatory dysfunction  Alcohol use disorder   Mark Hammond is a 33 yo male with a history of polysubstance use disorder (alcohol, tobacco cocaine, [employer], and cannabis), schizoaffective disorder, self-reported bipolar disorder who initially presented to the [facility] on 12/06/2023 voluntarily for complaints of suicidal ideations and requesting detox from ongoing alcohol, cocaine, and cannabis use.  Patient expressed interest in transitioning to a residential rehabilitation program and was admitted to [facility] on 12/08/2023 to complete detox.  . Medical hx of chronic left leg pain for which he was prescribed Gabapentin and Naproxen. He has been medically assessed multiple times for this leg pain.  Patient was discharged from the [facility] back in February 2025 and reports relapsing on substances shortly after discharge.  HPI:  Patient reports he has had ongoing polysubstance use since he was 33 years old, reports consuming substantial amounts of alcohol on a daily basis, daily use of cannabis, and spending approximately $40 daily on crack cocaine.  He denies any IV drug use.  He only notes 1 week period of sobriety during his last hospitalization at [facility], otherwise does not note any significant period of sobriety.  Concurrent with his substance use, he reports a history of chronic suicidal ideations, along with 3 prior suicide attempts.  Patient reports that his most recent attempt was last year in August 2024, where he reports attempting to overdose on fentanyl.  He denies any plan or  intent currently, contracts for safety on the unit.  Patient also does not note any significant motivator to complete detox and move forward with residential rehabilitation, he does note that he is tired of his ongoing substance use.  He identifies social support in the form of his girlfriend and uncle.  He lives with his girlfriend in [place], reports his partner is supportive and is not using any substances.  He does not have any children.  He reports no prior legal history or upcoming court dates.  He is amenable to exploring other residential rehabilitation programs other than [facility].  A major stressor the patient identifies is a chronic left leg pain that has made it difficult CV review of him to ambulate, requires walker or crutches to complete his ADLs.  He reports that his functional limitations, severely impact his mental health.  He identifies no injury or trauma to his leg when his symptoms started. He has been urinating night.  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 Anxiety Symptoms Not formally assessed Trauma Symptoms Not formally assessed Psychosis Symptoms Denied AVH, paranoia, delusional thought processes  Substance Use Hx: Alcohol: started at age 89. 1/5th and 4 40 oz, 2 16 oz, and 2 cartons a  wine daily.  [facility].  Dneies seizures or DTs Tobacco: 3 cigarretes daily, started ta age 79 Cannabis:1 bowl of weed a day, smokes it daily - slows his  usage of other, appetite Cocaine: smokes, $40 daily Methamphetamines: denies Opiates (fentanyl / heroin): oxycodone and percocets, depending on what is available to him . Reports using approximately 4 pills daily.  IVDU: denies  Past Psychiatric Hx: No current outpatient psychiatric follow-up Patient reports being previously diagnosed with bipolar disorder and depression. No current psychotropic medications Unable to recall  Past Medical History: PCP:No Medical Dx: Chronic unspecified left leg injury Allergies: codeine Trauma:Denies Seizures:Denies  Family Medical History: Unknown to pt  Family Psychiatric History: Unknown to pt  Social History: Patient lives in [place] with girlfriend Social Support: girlfriend Occupational ZO:[id number] Children:denies   PHQ 2-9:  Flowsheet Row ED from 12/08/20[facility] in [facility] ED from 11/21/2023 in [facility]  Thoughts that you would be better off dead, or of hurting yourself in some way More than half the days Not at all  PHQ-9 Total Score 20 8       Flowsheet Row ED from 12/08/2023 in [facility] ED from 12/06/2023 in [facility] ED from 12/04/2023 in [facility] Emergency Department at [facility]  C-SSRS RISK CATEGORY No Risk No Risk High Risk       Screenings    Flowsheet Row Most Recent Value  CIWA-Ar Total 7  COWS Total Score 8       Total Time spent with patient: 1.5 hours  Psychiatric Specialty Exam  Presentation General Appearance:  Disheveled (Malodorous)  Eye Contact: Good  Speech: Clear and Coherent; Normal Rate  Speech Volume: Decreased  Handedness: -- (not assessed)   Mood and Affect  Mood: Depressed; Irritable  Affect: Congruent   Thought Process  Thought Processes: Linear  Descriptions of Associations:Intact  Orientation:None  Thought Content:Logical  Diagnosis of  Schizophrenia or Schizoaffective disorder in past: Yes   Hallucinations:Hallucinations: None  Ideas of Reference:None  Suicidal Thoughts:Suicidal Thoughts: Yes, Passive SI Passive Intent and/or Plan: Without Intent; Without Plan; Without Access to Means; Without Means to Carry Out  Homicidal Thoughts:Homicidal Thoughts: No   Sensorium  Memory: Immediate Fair; Recent Fair; Remote Fair  Judgment: Poor  Insight: Poor   Executive Functions  Concentration: Fair  Attention Span: Fair  Recall: [employer] of Knowledge: Fair  Language: Fair   Psychomotor Activity  Psychomotor Activity: Psychomotor Activity: Normal   Assets  Assets: Desire for Improvement; Resilience; Communication Skills   Sleep  Sleep: Sleep: Fair Number of Hours of Sleep: 6   Nutritional Assessment (For [facility] and [facility] admissions only) Has the patient had a weight loss or gain of 10 pounds or more in the last 3 months?: No Has the patient had a decrease in food intake/or appetite?: No Does the patient have dental problems?: No Does the patient have eating habits or behaviors that may be indicators of an eating disorder including binging or inducing vomiting?: No Has the patient recently lost weight without trying?: 0 Has the patient been eating poorly because of a decreased appetite?: 0 Malnutrition Screening Tool Score: 0    Physical Exam Vitals and nursing note reviewed.  Constitutional:      General: He is not in acute distress.    Appearance: He is not ill-appearing.  HENT:     Head: Normocephalic and atraumatic.  Eyes:  Extraocular Movements: Extraocular movements intact.     Conjunctiva/sclera: Conjunctivae normal.  Pulmonary:     Effort: Pulmonary effort is normal. No respiratory distress.  Skin:    General: Skin is warm and dry.  Neurological:     General: No focal deficit present.    Review of Systems  All other systems reviewed and are negative.   Blood  pressure 127/85, pulse 83, temperature 97.8 F (36.6 C), temperature source Oral, resp. rate 18, SpO2 100%. There is no height or weight on file to calculate BMI.    Last Labs:  Admission on 12/06/2023, Discharged on 12/08/2023  Component Date Value Ref Range Status   Alcohol, Ethyl (B) 12/06/2023 <10  <10 mg/dL Final   Comment: (NOTE) Lowest detectable limit for serum alcohol is 10 mg/dL.  For medical purposes only. Performed at [facility], [address], [place], [place] [address]    POC Amphetamine UR 12/06/2023 None Detected  NONE DETECTED (Cut Off Level 1000 ng/mL) Final   POC Secobarbital (BAR) 12/06/2023 None Detected  NONE DETECTED (Cut Off Level 300 ng/mL) Final   POC Buprenorphine (BUP) 12/06/2023 None Detected  NONE DETECTED (Cut Off Level 10 ng/mL) Final   POC Oxazepam (BZO) 12/06/2023 Positive (A)  NONE DETECTED (Cut Off Level 300 ng/mL) Final   POC Cocaine UR 12/06/2023 Positive (A)  NONE DETECTED (Cut Off Level 300 ng/mL) Final   POC Methamphetamine UR 12/06/2023 None Detected  NONE DETECTED (Cut Off Level 1000 ng/mL) Final   POC Morphine 12/06/2023 None Detected  NONE DETECTED (Cut Off Level 300 ng/mL) Final   POC Methadone UR 12/06/2023 None Detected  NONE DETECTED (Cut Off Level 300 ng/mL) Final   POC Oxycodone UR 12/06/2023 None Detected  NONE DETECTED (Cut Off Level 100 ng/mL) Final   POC Marijuana UR 12/06/2023 Positive (A)  NONE DETECTED (Cut Off Level 50 ng/mL) Final   WBC 12/08/2023 4.4  4.0 - 10.5 K/uL Final   RBC 12/08/2023 3.53 (L)  4.22 - 5.81 MIL/uL Final   Hemoglobin 12/08/2023 11.2 (L)  13.0 - 17.0 g/dL Final   HCT 60/45/4098 32.6 (L)  39.0 - 52.0 % Final   MCV 12/08/2023 92.4  80.0 - 100.0 fL Final   MCH 12/08/2023 31.7  26.0 - 34.0 pg Final   MCHC 12/08/2023 34.4  30.0 - 36.0 g/dL Final   RDW 11/91/4782 13.9  11.5 - 15.5 % Final   Platelets 12/08/2023 280  150 - 400 K/uL Final   nRBC 12/08/2023 0.0  0.0 - 0.2 % Final   Neutrophils Relative  % 12/08/2023 59  % Final   Neutro Abs 12/08/2023 2.6  1.7 - 7.7 K/uL Final   Lymphocytes Relative 12/08/2023 27  % Final   Lymphs Abs 12/08/2023 1.2  0.7 - 4.0 K/uL Final   Monocytes Relative 12/08/2023 9  % Final   Monocytes Absolute 12/08/2023 0.4  0.1 - 1.0 K/uL Final   Eosinophils Relative 12/08/2023 3  % Final   Eosinophils Absolute 12/08/2023 0.1  0.0 - 0.5 K/uL Final   Basophils Relative 12/08/2023 1  % Final   Basophils Absolute 12/08/2023 0.1  0.0 - 0.1 K/uL Final   Immature Granulocytes 12/08/2023 1  % Final   Abs Immature Granulocytes 12/08/2023 0.02  0.00 - 0.07 K/uL Final   Performed at [facility], [address] [place], [place] [address]   Sodium 12/08/2023 139  135 - 145 mmol/L Final   Potassium 12/08/2023 4.0  3.5 -  5.1 mmol/L Final   Chloride 12/08/2023 100  98 - 111 mmol/L Final   CO2 12/08/2023 27  22 - 32 mmol/L Final   Glucose, Bld 12/08/2023 114 (H)  70 - 99 mg/dL Final   Glucose reference range applies only to samples taken after fasting for at least 8 hours.   BUN 12/08/2023 9  6 - 20 mg/dL Final   Creatinine, Ser 12/08/2023 0.60 (L)  0.61 - 1.24 mg/dL Final   Calcium 96/29/5284 8.7 (L)  8.9 - 10.3 mg/dL Final   Total Protein 13/24/4010 6.2 (L)  6.5 - 8.1 g/dL Final   Albumin 27/25/3664 2.3 (L)  3.5 - 5.0 g/dL Final   AST 40/34/7425 101 (H)  15 - 41 U/L Final   ALT 12/08/2023 55 (H)  0 - 44 U/L Final   Alkaline Phosphatase 12/08/2023 125  38 - 126 U/L Final   Total Bilirubin 12/08/2023 0.4  0.0 - 1.2 mg/dL Final   GFR, Estimated 12/08/2023 >60  >60 mL/min Final   Comment: (NOTE) Calculated using the CKD-EPI Creatinine Equation (2021)    Anion gap 12/08/2023 12  5 - 15 Final   Performed at [facility], [address] [place], [place] [address]   Cholesterol 12/08/2023 96  0 - 200 mg/dL Final   Triglycerides 75/64/3329 55  <150 mg/dL Final   HDL 51/88/4166 34 (L)  >40 mg/dL Final   Total CHOL/HDL Ratio 12/08/2023 2.8  RATIO Final   VLDL  12/08/2023 11  0 - 40 mg/dL Final   LDL Cholesterol 12/08/2023 51  0 - 99 mg/dL Final   Comment:        Total Cholesterol/HDL:CHD Risk Coronary Heart Disease Risk Table                     Men   Women  1/2 Average Risk   3.4   3.3  Average Risk       5.0   4.4  2 X Average Risk   9.6   7.1  3 X Average Risk  23.4   11.0        Use the calculated Patient Ratio above and the CHD Risk Table to determine the patient's CHD Risk.        ATP III CLASSIFICATION (LDL):  <100     mg/dL   Optimal  063-016  mg/dL   Near or Above                    Optimal  130-159  mg/dL   Borderline  010-932  mg/dL   High  >355     mg/dL   Very High Performed at [facility], [address], [place], [place] [address]    Magnesium 12/08/2023 1.7  1.7 - 2.4 mg/dL Final   Performed at [facility], [address], [place], [place] [address]   TSH 12/08/2023 0.714  0.350 - 4.500 uIU/mL Final   Comment: Performed by a 3rd Generation assay with a functional sensitivity of <=0.01 uIU/mL. Performed at [facility], [address] [place], [place] [address]    Hgb A1c MFr Bld 12/08/2023 6.0 (H)  4.8 - 5.6 % Final   Comment: (NOTE) Pre diabetes:          5.7%-6.4%  Diabetes:              >6.4%  Glycemic control for   <7.0% adults with diabetes  Mean Plasma Glucose 12/08/2023 125.5  mg/dL Final   Performed at [facility], [address], [place], [place] [address]  Admission on 12/04/2023, Discharged on 12/04/2023  Component Date Value Ref Range Status   Color, Urine 12/04/2023 YELLOW  YELLOW Final   APPearance 12/04/2023 CLEAR  CLEAR Final   Specific Gravity, Urine 12/04/2023 1.029  1.005 - 1.030 Final   pH 12/04/2023 5.0  5.0 - 8.0 Final   Glucose, UA 12/04/2023 NEGATIVE  NEGATIVE mg/dL Final   Hgb urine dipstick 12/04/2023 NEGATIVE  NEGATIVE Final   Bilirubin Urine 12/04/2023 NEGATIVE  NEGATIVE Final   Ketones, ur 12/04/2023 NEGATIVE  NEGATIVE mg/dL Final   Protein, ur  60/45/4098 NEGATIVE  NEGATIVE mg/dL Final   Nitrite 11/91/4782 NEGATIVE  NEGATIVE Final   Leukocytes,Ua 12/04/2023 NEGATIVE  NEGATIVE Final   Performed at [facility], [address] [place], [place] [address]  Admission on 11/27/2023, Discharged on 11/28/2023  Component Date Value Ref Range Status   WBC 11/27/2023 13.5 (H)  4.0 - 10.5 K/uL Final   RBC 11/27/2023 4.12 (L)  4.22 - 5.81 MIL/uL Final   Hemoglobin 11/27/2023 12.8 (L)  13.0 - 17.0 g/dL Final   HCT 30/86/5784 38.4 (L)  39.0 - 52.0 % Final   MCV 11/27/2023 93.2  80.0 - 100.0 fL Final   MCH 11/27/2023 31.1  26.0 - 34.0 pg Final   MCHC 11/27/2023 33.3  30.0 - 36.0 g/dL Final   RDW 69/62/9528 13.4  11.5 - 15.5 % Final   Platelets 11/27/2023 375  150 - 400 K/uL Final   nRBC 11/27/2023 0.0  0.0 - 0.2 % Final   Performed at [facility], [address], [place], [place] [address]   Sodium 11/27/2023 132 (L)  135 - 145 mmol/L Final   Potassium 11/27/2023 4.3  3.5 - 5.1 mmol/L Final   Chloride 11/27/2023 95 (L)  98 - 111 mmol/L Final   CO2 11/27/2023 21 (L)  22 - 32 mmol/L Final   Glucose, Bld 11/27/2023 113 (H)  70 - 99 mg/dL Final   Glucose reference range applies only to samples taken after fasting for at least 8 hours.   BUN 11/27/2023 15  6 - 20 mg/dL Final   Creatinine, Ser 11/27/2023 0.86  0.61 - 1.24 mg/dL Final   Calcium 40/07/2724 9.5  8.9 - 10.3 mg/dL Final   GFR, Estimated 11/27/2023 >60  >60 mL/min Final   Comment: (NOTE) Calculated using the CKD-EPI Creatinine Equation (2021)    Anion gap 11/27/2023 16 (H)  5 - 15 Final   Performed at [facility], [address], [place], [place] [address]   Salicylate Lvl 11/27/2023 <7.0 (L)  7.0 - 30.0 mg/dL Final   Performed at [facility], [address] [place], [place] [address]   Acetaminophen (Tylenol), Serum 11/27/2023 10  10 - 30 ug/mL Final   Comment: (NOTE) Therapeutic concentrations vary significantly. A range of 10-30 ug/mL   may be an effective concentration for many patients. However, some  are best treated at concentrations outside of this range. Acetaminophen concentrations >150 ug/mL at 4 hours after ingestion  and >50 ug/mL at 12 hours after ingestion are often associated with  toxic reactions.  Performed at [facility], [address], [place], [place] [address]    Alcohol, Ethyl (B) 11/27/2023 127 (H)  <10 mg/dL Final   Comment: (NOTE) Lowest detectable limit for serum alcohol is 10  mg/dL.  For medical purposes only. Performed at [facility], [address] [place], [place] [address]    Opiates 11/27/2023 POSITIVE (A)  NONE DETECTED Final   Cocaine 11/27/2023 POSITIVE (A)  NONE DETECTED Final   Benzodiazepines 11/27/2023 POSITIVE (A)  NONE DETECTED Final   Amphetamines 11/27/2023 NONE DETECTED  NONE DETECTED Final   Tetrahydrocannabinol 11/27/2023 POSITIVE (A)  NONE DETECTED Final   Barbiturates 11/27/2023 NONE DETECTED  NONE DETECTED Final   Comment: (NOTE) DRUG SCREEN FOR MEDICAL PURPOSES ONLY.  IF CONFIRMATION IS NEEDED FOR ANY PURPOSE, NOTIFY LAB WITHIN 5 DAYS.  LOWEST DETECTABLE LIMITS FOR URINE DRUG SCREEN Drug Class                     Cutoff (ng/mL) Amphetamine and metabolites    1000 Barbiturate and metabolites    200 Benzodiazepine                 200 Opiates and metabolites        300 Cocaine and metabolites        300 THC                            50 Performed at [facility], [address], [place], [place] [address]    Color, Urine 11/27/2023 AMBER (A)  YELLOW Final   BIOCHEMICALS MAY BE AFFECTED BY COLOR   APPearance 11/27/2023 CLEAR  CLEAR Final   Specific Gravity, Urine 11/27/2023 1.020  1.005 - 1.030 Final   pH 11/27/2023 5.0  5.0 - 8.0 Final   Glucose, UA 11/27/2023 NEGATIVE  NEGATIVE mg/dL Final   Hgb urine dipstick 11/27/2023 NEGATIVE  NEGATIVE Final   Bilirubin Urine 11/27/2023 NEGATIVE  NEGATIVE Final   Ketones, ur 11/27/2023 NEGATIVE   NEGATIVE mg/dL Final   Protein, ur 84/69/6295 NEGATIVE  NEGATIVE mg/dL Final   Nitrite 28/41/3244 NEGATIVE  NEGATIVE Final   Leukocytes,Ua 11/27/2023 NEGATIVE  NEGATIVE Final   Performed at [facility], [address] [place], [place] [address]   Neisseria Gonorrhea 11/27/2023 Negative   Final   Chlamydia 11/27/2023 Negative   Final   Comment 11/27/2023 Normal Reference Ranger Chlamydia - Negative   Final   Comment 11/27/2023 Normal Reference Range Neisseria Gonorrhea - Negative   Final  Admission on 11/21/2023, Discharged on 11/27/2023  Component Date Value Ref Range Status   SARS Coronavirus 2 by RT PCR 11/25/2023 NEGATIVE  NEGATIVE Final   Performed at [facility], [address] [place], [place] [address]   WBC 11/21/2023 5.6  4.0 - 10.5 K/uL Final   RBC 11/21/2023 4.14 (L)  4.22 - 5.81 MIL/uL Final   Hemoglobin 11/21/2023 13.1  13.0 - 17.0 g/dL Final   HCT 44/12/4740 38.8 (L)  39.0 - 52.0 % Final   MCV 11/21/2023 93.7  80.0 - 100.0 fL Final   MCH 11/21/2023 31.6  26.0 - 34.0 pg Final   MCHC 11/21/2023 33.8  30.0 - 36.0 g/dL Final   RDW 59/56/3875 13.2  11.5 - 15.5 % Final   Platelets 11/21/2023 298  150 - 400 K/uL Final   nRBC 11/21/2023 0.0  0.0 - 0.2 % Final   Neutrophils Relative % 11/21/2023 83  % Final   Neutro Abs 11/21/2023 4.6  1.7 - 7.7 K/uL Final   Lymphocytes Relative 11/21/2023 14  % Final   Lymphs Abs 11/21/2023 0.8  0.7 - 4.0  K/uL Final   Monocytes Relative 11/21/2023 3  % Final   Monocytes Absolute 11/21/2023 0.2  0.1 - 1.0 K/uL Final   Eosinophils Relative 11/21/2023 0  % Final   Eosinophils Absolute 11/21/2023 0.0  0.0 - 0.5 K/uL Final   Basophils Relative 11/21/2023 0  % Final   Basophils Absolute 11/21/2023 0.0  0.0 - 0.1 K/uL Final   Immature Granulocytes 11/21/2023 0  % Final   Abs Immature Granulocytes 11/21/2023 0.01  0.00 - 0.07 K/uL Final   Performed at [facility], [address] [place], [place] [address]   Sodium 11/21/2023  135  135 - 145 mmol/L Final   Potassium 11/21/2023 4.2  3.5 - 5.1 mmol/L Final   Chloride 11/21/2023 99  98 - 111 mmol/L Final   CO2 11/21/2023 26  22 - 32 mmol/L Final   Glucose, Bld 11/21/2023 162 (H)  70 - 99 mg/dL Final   Glucose reference range applies only to samples taken after fasting for at least 8 hours.   BUN 11/21/2023 7  6 - 20 mg/dL Final   Creatinine, Ser 11/21/2023 0.63  0.61 - 1.24 mg/dL Final   Calcium 84/13/2440 8.9  8.9 - 10.3 mg/dL Final   Total Protein 07/30/2535 8.1  6.5 - 8.1 g/dL Final   Albumin 64/40/3474 2.8 (L)  3.5 - 5.0 g/dL Final   AST 25/95/6387 91 (H)  15 - 41 U/L Final   ALT 11/21/2023 55 (H)  0 - 44 U/L Final   Alkaline Phosphatase 11/21/2023 87  38 - 126 U/L Final   Total Bilirubin 11/21/2023 0.8  0.0 - 1.2 mg/dL Final   GFR, Estimated 11/21/2023 >60  >60 mL/min Final   Comment: (NOTE) Calculated using the CKD-EPI Creatinine Equation (2021)    Anion gap 11/21/2023 10  5 - 15 Final   Performed at [facility], [address] [place], [place] [address]   Alcohol, Ethyl (B) 11/21/2023 <10  <10 mg/dL Final   Comment: (NOTE) Lowest detectable limit for serum alcohol is 10 mg/dL.  For medical purposes only. Performed at [facility], [address], [place], [place] [address]    TSH 11/21/2023 0.365  0.350 - 4.500 uIU/mL Final   Comment: Performed by a 3rd Generation assay with a functional sensitivity of <=0.01 uIU/mL. Performed at [facility], [address] [place], [place] [address]    POC Amphetamine UR 11/22/2023 None Detected  NONE DETECTED (Cut Off Level 1000 ng/mL) Final   POC Secobarbital (BAR) 11/22/2023 None Detected  NONE DETECTED (Cut Off Level 300 ng/mL) Final   POC Buprenorphine (BUP) 11/22/2023 None Detected  NONE DETECTED (Cut Off Level 10 ng/mL) Final   POC Oxazepam (BZO) 11/22/2023 Positive (A)  NONE DETECTED (Cut Off Level 300 ng/mL) Final   POC Cocaine UR 11/22/2023 Positive (A)  NONE DETECTED (Cut Off Level  300 ng/mL) Final   POC Methamphetamine UR 11/22/2023 None Detected  NONE DETECTED (Cut Off Level 1000 ng/mL) Final   POC Morphine 11/22/2023 None Detected  NONE DETECTED (Cut Off Level 300 ng/mL) Final   POC Methadone UR 11/22/2023 None Detected  NONE DETECTED (Cut Off Level 300 ng/mL) Final   POC Oxycodone UR 11/22/2023 None Detected  NONE DETECTED (Cut Off Level 100 ng/mL) Final   POC Marijuana UR 11/22/2023 Positive (A)  NONE DETECTED (Cut Off Level 50 ng/mL) Final   Hgb A1c MFr Bld 11/25/2023 5.9 (H)  4.8 - 5.6 % Final  Comment: (NOTE) Pre diabetes:          5.7%-6.4%  Diabetes:              >6.4%  Glycemic control for   <7.0% adults with diabetes    Mean Plasma Glucose 11/25/2023 122.63  mg/dL Final   Performed at [facility], [address], [place], [place] [address]   Sodium 11/25/2023 136  135 - 145 mmol/L Final   Potassium 11/25/2023 4.2  3.5 - 5.1 mmol/L Final   Chloride 11/25/2023 101  98 - 111 mmol/L Final   CO2 11/25/2023 27  22 - 32 mmol/L Final   Glucose, Bld 11/25/2023 108 (H)  70 - 99 mg/dL Final   Glucose reference range applies only to samples taken after fasting for at least 8 hours.   BUN 11/25/2023 9  6 - 20 mg/dL Final   Creatinine, Ser 11/25/2023 0.68  0.61 - 1.24 mg/dL Final   Calcium 60/45/4098 8.6 (L)  8.9 - 10.3 mg/dL Final   Total Protein 11/91/4782 7.0  6.5 - 8.1 g/dL Final   Albumin 95/62/1308 2.3 (L)  3.5 - 5.0 g/dL Final   AST 65/78/4696 81 (H)  15 - 41 U/L Final   ALT 11/25/2023 58 (H)  0 - 44 U/L Final   Alkaline Phosphatase 11/25/2023 84  38 - 126 U/L Final   Total Bilirubin 11/25/2023 0.7  0.0 - 1.2 mg/dL Final   GFR, Estimated 11/25/2023 >60  >60 mL/min Final   Comment: (NOTE) Calculated using the CKD-EPI Creatinine Equation (2021)    Anion gap 11/25/2023 8  5 - 15 Final   Performed at [facility], [address] [place], [place] [address]   Iron 11/25/2023 21 (L)  45 - 182 ug/dL Final   TIBC 41/32/4401 280  250 - 450 ug/dL  Final   Saturation Ratios 11/25/2023 8 (L)  17.9 - 39.5 % Final   UIBC 11/25/2023 259  ug/dL Final   Performed at [facility], [address], [place], [place] [address]   Vitamin B-12 11/25/2023 549  180 - 914 pg/mL Final   Comment: (NOTE) This assay is not validated for testing neonatal or myeloproliferative syndrome specimens for Vitamin B12 levels. Performed at [facility], [address] [place], [place] [address]    Ferritin 11/25/2023 217  24 - 336 ng/mL Final   Performed at [facility], [address] [place], [place] [address]  Admission on 11/19/2023, Discharged on 11/20/2023  Component Date Value Ref Range Status   Sodium 11/19/2023 138  135 - 145 mmol/L Final   Potassium 11/19/2023 3.4 (L)  3.5 - 5.1 mmol/L Final   Chloride 11/19/2023 103  98 - 111 mmol/L Final   CO2 11/19/2023 26  22 - 32 mmol/L Final   Glucose, Bld 11/19/2023 123 (H)  70 - 99 mg/dL Final   Glucose reference range applies only to samples taken after fasting for at least 8 hours.   BUN 11/19/2023 <5 (L)  6 - 20 mg/dL Final   Creatinine, Ser 11/19/2023 0.50 (L)  0.61 - 1.24 mg/dL Final   Calcium 25/95/6387 8.2 (L)  8.9 - 10.3 mg/dL Final   GFR, Estimated 11/19/2023 >60  >60 mL/min Final   Comment: (NOTE) Calculated using the CKD-EPI Creatinine Equation (2021)    Anion gap 11/19/2023 9  5 - 15 Final   Performed at [facility], [address] [place], [place]  [address]   WBC 11/19/2023 8.4  4.0 - 10.5 K/uL Final   RBC 11/19/2023 4.00 (L)  4.22 - 5.81 MIL/uL Final   Hemoglobin 11/19/2023 12.6 (L)  13.0 - 17.0 g/dL Final   HCT 91/47/8295 37.7 (L)  39.0 - 52.0 % Final   MCV 11/19/2023 94.3  80.0 - 100.0 fL Final   MCH 11/19/2023 31.5  26.0 - 34.0 pg Final   MCHC 11/19/2023 33.4  30.0 - 36.0 g/dL Final   RDW 62/13/0865 13.2  11.5 - 15.5 % Final   Platelets 11/19/2023 267  150 - 400 K/uL Final   nRBC 11/19/2023 0.0  0.0 - 0.2 % Final   Neutrophils Relative %  11/19/2023 65  % Final   Neutro Abs 11/19/2023 5.5  1.7 - 7.7 K/uL Final   Lymphocytes Relative 11/19/2023 22  % Final   Lymphs Abs 11/19/2023 1.9  0.7 - 4.0 K/uL Final   Monocytes Relative 11/19/2023 11  % Final   Monocytes Absolute 11/19/2023 0.9  0.1 - 1.0 K/uL Final   Eosinophils Relative 11/19/2023 1  % Final   Eosinophils Absolute 11/19/2023 0.1  0.0 - 0.5 K/uL Final   Basophils Relative 11/19/2023 1  % Final   Basophils Absolute 11/19/2023 0.1  0.0 - 0.1 K/uL Final   Immature Granulocytes 11/19/2023 0  % Final   Abs Immature Granulocytes 11/19/2023 0.03  0.00 - 0.07 K/uL Final   Performed at [facility], [address], [place], [place] [address]   Sed Rate 11/19/2023 77 (H)  0 - 16 mm/hr Final   Performed at [facility], [address] [place], [place] [address]   CRP 11/19/2023 4.2 (H)  <1.0 mg/dL Final   Performed at [facility], [address], [place], [place] [address]   Sodium 11/19/2023 141  135 - 145 mmol/L Final   Potassium 11/19/2023 3.5  3.5 - 5.1 mmol/L Final   Chloride 11/19/2023 101  98 - 111 mmol/L Final   BUN 11/19/2023 <3 (L)  6 - 20 mg/dL Final   Creatinine, Ser 11/19/2023 0.70  0.61 - 1.24 mg/dL Final   Glucose, Bld 44/10/270 126 (H)  70 - 99 mg/dL Final   Glucose reference range applies only to samples taken after fasting for at least 8 hours.   Calcium, Ion 11/19/2023 1.05 (L)  1.15 - 1.40 mmol/L Final   TCO2 11/19/2023 27  22 - 32 mmol/L Final   Hemoglobin 11/19/2023 12.9 (L)  13.0 - 17.0 g/dL Final   HCT 53/66/4403 38.0 (L)  39.0 - 52.0 % Final   SARS Coronavirus 2 by RT PCR 11/19/2023 NEGATIVE  NEGATIVE Final   Comment: (NOTE) SARS-CoV-2 target nucleic acids are NOT DETECTED.  The SARS-CoV-2 RNA is generally detectable in upper respiratory specimens during the acute phase of infection. The lowest concentration of SARS-CoV-2 viral copies this assay can detect is 138 copies/mL. A negative result does not  preclude SARS-Cov-2 infection and should not be used as the sole basis for treatment or other patient management decisions. A negative result may occur with  improper specimen collection/handling, submission of specimen other than nasopharyngeal swab, presence of viral mutation(s) within the areas targeted by this assay, and inadequate number of viral copies(<138 copies/mL). A negative result must be combined with clinical observations, patient history, and epidemiological information. The expected result is Negative.  Fact Sheet for Patients:  BloggerCourse.com  Fact Sheet for Healthcare Providers:  SeriousBroker.it  This test is no  t yet approved or cleared by the [place] and  has been authorized for detection and/or diagnosis of SARS-CoV-2 by FDA under an Emergency Use Authorization (EUA). This EUA will remain  in effect (meaning this test can be used) for the duration of the COVID-19 declaration under Section 564(b)(1) of the Act, 21 U.S.C.section 360bbb-3(b)(1), unless the authorization is terminated  or revoked sooner.       Influenza A by PCR 11/19/2023 NEGATIVE  NEGATIVE Final   Influenza B by PCR 11/19/2023 NEGATIVE  NEGATIVE Final   Comment: (NOTE) The Xpert Xpress SARS-CoV-2/FLU/RSV plus assay is intended as an aid in the diagnosis of influenza from Nasopharyngeal swab specimens and should not be used as a sole basis for treatment. Nasal washings and aspirates are unacceptable for Xpert Xpress SARS-CoV-2/FLU/RSV testing.  Fact Sheet for Patients: BloggerCourse.com  Fact Sheet for Healthcare Providers: SeriousBroker.it  This test is not yet approved or cleared by the [place] FDA and has been authorized for detection and/or diagnosis of SARS-CoV-2 by FDA under an Emergency Use Authorization (EUA). This EUA will remain in  effect (meaning this test can be used) for the duration of the COVID-19 declaration under Section 564(b)(1) of the Act, 21 U.S.C. section 360bbb-3(b)(1), unless the authorization is terminated or revoked.     Resp Syncytial Virus by PCR 11/19/2023 NEGATIVE  NEGATIVE Final   Comment: (NOTE) Fact Sheet for Patients: BloggerCourse.com  Fact Sheet for Healthcare Providers: SeriousBroker.it  This test is not yet approved or cleared by the [place] FDA and has been authorized for detection and/or diagnosis of SARS-CoV-2 by FDA under an Emergency Use Authorization (EUA). This EUA will remain in effect (meaning this test can be used) for the duration of the COVID-19 declaration under Section 564(b)(1) of the Act, 21 U.S.C. section 360bbb-3(b)(1), unless the authorization is terminated or revoked.  Performed at [facility], [address], [place], [place] [address]   Admission on 11/09/2023, Discharged on 11/09/2023  Component Date Value Ref Range Status   Color, Urine 11/09/2023 AMBER (A)  YELLOW Final   BIOCHEMICALS MAY BE AFFECTED BY COLOR   APPearance 11/09/2023 CLEAR  CLEAR Final   Specific Gravity, Urine 11/09/2023 1.036 (H)  1.005 - 1.030 Final   pH 11/09/2023 5.0  5.0 - 8.0 Final   Glucose, UA 11/09/2023 NEGATIVE  NEGATIVE mg/dL Final   Hgb urine dipstick 11/09/2023 NEGATIVE  NEGATIVE Final   Bilirubin Urine 11/09/2023 SMALL (A)  NEGATIVE Final   Ketones, ur 11/09/2023 NEGATIVE  NEGATIVE mg/dL Final   Protein, ur 60/45/4098 30 (A)  NEGATIVE mg/dL Final   Nitrite 11/91/4782 NEGATIVE  NEGATIVE Final   Leukocytes,Ua 11/09/2023 NEGATIVE  NEGATIVE Final   RBC / HPF 11/09/2023 0-5  0 - 5 RBC/hpf Final   WBC, UA 11/09/2023 0-5  0 - 5 WBC/hpf Final   Bacteria, UA 11/09/2023 NONE SEEN  NONE SEEN Final   Squamous Epithelial / HPF 11/09/2023 0-5  0 - 5 /HPF Final   Mucus 11/09/2023 PRESENT   Final   Performed  at [facility], [address], [place], [place] [address]   Opiates 11/09/2023 POSITIVE (A)  NONE DETECTED Final   Cocaine 11/09/2023 POSITIVE (A)  NONE DETECTED Final   Benzodiazepines 11/09/2023 POSITIVE (A)  NONE DETECTED Final   Amphetamines 11/09/2023 NONE DETECTED  NONE DETECTED Final   Tetrahydrocannabinol 11/09/2023 POSITIVE (A)  NONE DETECTED Final   Barbiturates 11/09/2023 NONE DETECTED  NONE DETECTED Final  Comment: (NOTE) DRUG SCREEN FOR MEDICAL PURPOSES ONLY.  IF CONFIRMATION IS NEEDED FOR ANY PURPOSE, NOTIFY LAB WITHIN 5 DAYS.  LOWEST DETECTABLE LIMITS FOR URINE DRUG SCREEN Drug Class                     Cutoff (ng/mL) Amphetamine and metabolites    1000 Barbiturate and metabolites    200 Benzodiazepine                 200 Opiates and metabolites        300 Cocaine and metabolites        300 THC                            50 Performed at [facility], [address], [place], [place] [address]   Admission on 08/10/2023, Discharged on 08/14/2023  Component Date Value Ref Range Status   Sodium 08/10/2023 137  135 - 145 mmol/L Final   Potassium 08/10/2023 3.4 (L)  3.5 - 5.1 mmol/L Final   Chloride 08/10/2023 100  98 - 111 mmol/L Final   CO2 08/10/2023 25  22 - 32 mmol/L Final   Glucose, Bld 08/10/2023 109 (H)  70 - 99 mg/dL Final   Glucose reference range applies only to samples taken after fasting for at least 8 hours.   BUN 08/10/2023 11  6 - 20 mg/dL Final   Creatinine, Ser 08/10/2023 0.71  0.61 - 1.24 mg/dL Final   Calcium 51/11/5850 8.8 (L)  8.9 - 10.3 mg/dL Final   Total Protein 77/82/4235 7.9  6.5 - 8.1 g/dL Final   Albumin 36/14/4315 3.8  3.5 - 5.0 g/dL Final   AST 40/05/6760 466 (H)  15 - 41 U/L Final   ALT 08/10/2023 264 (H)  0 - 44 U/L Final   Alkaline Phosphatase 08/10/2023 112  38 - 126 U/L Final   Total Bilirubin 08/10/2023 1.2 (H)  <1.2 mg/dL Final   GFR, Estimated 08/10/2023 >60  >60 mL/min Final   Comment:  (NOTE) Calculated using the CKD-EPI Creatinine Equation (2021)    Anion gap 08/10/2023 12  5 - 15 Final   Performed at [facility], [address] [place], [place] [address]   Alcohol, Ethyl (B) 08/10/2023 144 (H)  <10 mg/dL Final   Comment: (NOTE) Lowest detectable limit for serum alcohol is 10 mg/dL.  For medical purposes only. Performed at [facility], [address], [place], [place] [address]    Salicylate Lvl 08/10/2023 <7.0 (L)  7.0 - 30.0 mg/dL Final   Performed at [facility], [address] [place], [place] [address]   Acetaminophen (Tylenol), Serum 08/10/2023 <10 (L)  10 - 30 ug/mL Final   Comment: (NOTE) Therapeutic concentrations vary significantly. A range of 10-30 ug/mL  may be an effective concentration for many patients. However, some  are best treated at concentrations outside of this range. Acetaminophen concentrations >150 ug/mL at 4 hours after ingestion  and >50 ug/mL at 12 hours after ingestion are often associated with  toxic reactions.  Performed at [facility], [address], [place], [place] [address]    WBC 08/10/2023 8.0  4.0 - 10.5 K/uL Final   RBC 08/10/2023 4.13 (L)  4.22 - 5.81 MIL/uL Final   Hemoglobin 08/10/2023 13.8  13.0 - 17.0 g/dL Final   HCT 25/02/3975 39.4  39.0 - 52.0 % Final  MCV 08/10/2023 95.4  80.0 - 100.0 fL Final   MCH 08/10/2023 33.4  26.0 - 34.0 pg Final   MCHC 08/10/2023 35.0  30.0 - 36.0 g/dL Final   RDW 40/98/1191 13.1  11.5 - 15.5 % Final   Platelets 08/10/2023 226  150 - 400 K/uL Final   nRBC 08/10/2023 0.0  0.0 - 0.2 % Final   Performed at [facility], [address], [place], [place] [address]   Opiates 08/10/2023 NONE DETECTED  NONE DETECTED Final   Cocaine 08/10/2023 POSITIVE (A)  NONE DETECTED Final   Benzodiazepines 08/10/2023 NONE DETECTED  NONE DETECTED Final   Amphetamines 08/10/2023 POSITIVE (A)  NONE DETECTED  Final   Tetrahydrocannabinol 08/10/2023 POSITIVE (A)  NONE DETECTED Final   Barbiturates 08/10/2023 NONE DETECTED  NONE DETECTED Final   Comment: (NOTE) DRUG SCREEN FOR MEDICAL PURPOSES ONLY.  IF CONFIRMATION IS NEEDED FOR ANY PURPOSE, NOTIFY LAB WITHIN 5 DAYS.  LOWEST DETECTABLE LIMITS FOR URINE DRUG SCREEN Drug Class                     Cutoff (ng/mL) Amphetamine and metabolites    1000 Barbiturate and metabolites    200 Benzodiazepine                 200 Opiates and metabolites        300 Cocaine and metabolites        300 THC                            50 Performed at [facility], [address] [place], [place] [address]    Total CK 08/10/2023 461 (H)  49 - 397 U/L Final   Performed at [facility], [address], [place], [place] [address]   Hepatitis B Surface Ag 08/11/2023 NON REACTIVE  NON REACTIVE Final   HCV Ab 08/11/2023 Reactive (A)  NON REACTIVE Final   Comment: (NOTE) The CDC recommends that a Reactive HCV antibody result be followed up  with a HCV Nucleic Acid Amplification test.     Hep A IgM 08/11/2023 NON REACTIVE  NON REACTIVE Final   Hep B C IgM 08/11/2023 NON REACTIVE  NON REACTIVE Final   Performed at [facility], [address] [place], [place] [address]   Total Protein 08/12/2023 7.4  6.5 - 8.1 g/dL Final   Albumin 29/52/8413 3.2 (L)  3.5 - 5.0 g/dL Final   AST 24/40/1027 381 (H)  15 - 41 U/L Final   ALT 08/12/2023 247 (H)  0 - 44 U/L Final   Alkaline Phosphatase 08/12/2023 97  38 - 126 U/L Final   Total Bilirubin 08/12/2023 1.0  <1.2 mg/dL Final   Bilirubin, Direct 08/12/2023 0.2  0.0 - 0.2 mg/dL Final   Indirect Bilirubin 08/12/2023 0.8  0.3 - 0.9 mg/dL Final   Performed at [facility], [address] [place], [place] [address]   Total CK 08/12/2023 41 (L)  49 - 397 U/L Final   Performed at [facility], [address], [place], [place] [address]   Ammonia  08/12/2023 48 (H)  9 - 35 umol/L Final   Performed at [facility], [address] [place], [place] [address]  Admission on 08/04/2023, Discharged on 08/07/2023  Component Date Value Ref Range Status   Amphetamines, Urine 08/04/2023 Negative  Cutoff=1000 ng/mL Final  Amphetamine test includes Amphetamine and Methamphetamine.   Barbiturate, Ur 08/04/2023 Negative  Cutoff=300 ng/mL Final   Benzodiazepine Quant, Ur 08/04/2023 Negative  Cutoff=300 ng/mL Final   Cannabinoid Quant, Ur 08/04/2023 Negative  Cutoff=50 ng/mL Final   Cocaine (Metab.) 08/04/2023 See Final Results  Cutoff=300 ng/mL Final   Opiate Quant, Ur 08/04/2023 Negative  Cutoff=300 ng/mL Final   Opiate test includes Codeine and Morphine only.   Phencyclidine, Ur 08/04/2023 Negative  Cutoff=25 ng/mL Final   Methadone Screen, Urine 08/04/2023 Negative  Cutoff=300 ng/mL Final   Propoxyphene, Urine 08/04/2023 Negative  Cutoff=300 ng/mL Final   Ethanol U, Cloretta Ned 08/04/2023 See Final Results  Cutoff=0.020 % Final   Comment: (NOTE) Performed At: [facility] [address] [place], [place] [id number] Avis Epley PhD [id number]    WBC 08/04/2023 5.8  4.0 - 10.5 K/uL Final   RBC 08/04/2023 3.93 (L)  4.22 - 5.81 MIL/uL Final   Hemoglobin 08/04/2023 12.9 (L)  13.0 - 17.0 g/dL Final   HCT 95/62/1308 36.4 (L)  39.0 - 52.0 % Final   MCV 08/04/2023 92.6  80.0 - 100.0 fL Final   MCH 08/04/2023 32.8  26.0 - 34.0 pg Final   MCHC 08/04/2023 35.4  30.0 - 36.0 g/dL Final   RDW 65/78/4696 12.7  11.5 - 15.5 % Final   Platelets 08/04/2023 147 (L)  150 - 400 K/uL Final   Comment: SPECIMEN CHECKED FOR CLOTS REPEATED TO VERIFY PLATELET COUNT CONFIRMED BY SMEAR    nRBC 08/04/2023 0.0  0.0 - 0.2 % Final   Neutrophils Relative % 08/04/2023 76  % Final   Neutro Abs 08/04/2023 4.4  1.7 - 7.7 K/uL Final   Lymphocytes Relative 08/04/2023 16  % Final   Lymphs Abs 08/04/2023 0.9  0.7 - 4.0 K/uL Final   Monocytes Relative  08/04/2023 6  % Final   Monocytes Absolute 08/04/2023 0.3  0.1 - 1.0 K/uL Final   Eosinophils Relative 08/04/2023 0  % Final   Eosinophils Absolute 08/04/2023 0.0  0.0 - 0.5 K/uL Final   Basophils Relative 08/04/2023 1  % Final   Basophils Absolute 08/04/2023 0.0  0.0 - 0.1 K/uL Final   Immature Granulocytes 08/04/2023 1  % Final   Abs Immature Granulocytes 08/04/2023 0.04  0.00 - 0.07 K/uL Final   Performed at [facility], [address] [place], [place] [address]   Sodium 08/04/2023 134 (L)  135 - 145 mmol/L Final   Potassium 08/04/2023 3.0 (L)  3.5 - 5.1 mmol/L Final   Chloride 08/04/2023 95 (L)  98 - 111 mmol/L Final   CO2 08/04/2023 24  22 - 32 mmol/L Final   Glucose, Bld 08/04/2023 115 (H)  70 - 99 mg/dL Final   Glucose reference range applies only to samples taken after fasting for at least 8 hours.   BUN 08/04/2023 5 (L)  6 - 20 mg/dL Final   Creatinine, Ser 08/04/2023 0.56 (L)  0.61 - 1.24 mg/dL Final   Calcium 41/32/4401 8.2 (L)  8.9 - 10.3 mg/dL Final   Total Protein 02/72/5366 7.7  6.5 - 8.1 g/dL Final   Albumin 44/12/4740 3.6  3.5 - 5.0 g/dL Final   AST 59/56/3875 205 (H)  15 - 41 U/L Final   ALT 08/04/2023 99 (H)  0 - 44 U/L Final   Alkaline Phosphatase 08/04/2023 98  38 - 126 U/L Final   Total Bilirubin 08/04/2023 1.0  0.3 - 1.2 mg/dL Final   GFR, Estimated 08/04/2023 >  60  >60 mL/min Final   Comment: (NOTE) Calculated using the CKD-EPI Creatinine Equation (2021)    Anion gap 08/04/2023 15  5 - 15 Final   Performed at [facility], [address], [place], [place] [address]   Hgb A1c MFr Bld 08/04/2023 5.4  4.8 - 5.6 % Final   Comment: (NOTE) Pre diabetes:          5.7%-6.4%  Diabetes:              >6.4%  Glycemic control for   <7.0% adults with diabetes    Mean Plasma Glucose 08/04/2023 108.28  mg/dL Final   Performed at [facility], [address] [place], [place] [address]   TSH 08/04/2023 0.613  0.350 - 4.500 uIU/mL Final   Comment:  Performed by a 3rd Generation assay with a functional sensitivity of <=0.01 uIU/mL. Performed at [facility], [address], [place], [place] [address]    Neisseria Gonorrhea 08/04/2023 Negative   Final   Chlamydia 08/04/2023 Negative   Final   Comment 08/04/2023 Normal Reference Ranger Chlamydia - Negative   Final   Comment 08/04/2023 Normal Reference Range Neisseria Gonorrhea - Negative   Final   RPR Ser Ql 08/04/2023 NON REACTIVE  NON REACTIVE Final   Performed at [facility], [address], [place], [place] [address]   HIV-1 P24 Antigen - HIV24 08/04/2023 NON REACTIVE  NON REACTIVE Final   Comment: (NOTE) Detection of p24 may be inhibited by biotin in the sample, causing false negative results in acute infection.    HIV 1/2 Antibodies 08/04/2023 NON REACTIVE  NON REACTIVE Final   Interpretation (HIV Ag Ab) 08/04/2023 A non reactive test result means that HIV 1 or HIV 2 antibodies and HIV 1 p24 antigen were not detected in the specimen.   Final   Performed at [facility], [address] [place], [place] [address]   Hepatitis B Surface Ag 08/04/2023 NON REACTIVE  NON REACTIVE Final   HCV Ab 08/04/2023 Reactive (A)  NON REACTIVE Final   Comment: (NOTE) The CDC recommends that a Reactive HCV antibody result be followed up  with a HCV Nucleic Acid Amplification test.     Hep A IgM 08/04/2023 NON REACTIVE  NON REACTIVE Final   Hep B C IgM 08/04/2023 NON REACTIVE  NON REACTIVE Final   Performed at [facility], [address], [place], [place] [address]   POC Amphetamine UR 08/04/2023 None Detected  NONE DETECTED (Cut Off Level 1000 ng/mL) Final   POC Secobarbital (BAR) 08/04/2023 None Detected  NONE DETECTED (Cut Off Level 300 ng/mL) Final   POC Buprenorphine (BUP) 08/04/2023 None Detected  NONE DETECTED (Cut Off Level 10 ng/mL) Final   POC Oxazepam (BZO) 08/04/2023 None Detected  NONE DETECTED (Cut Off Level 300 ng/mL) Final   POC Cocaine UR 08/04/2023  Positive (A)  NONE DETECTED (Cut Off Level 300 ng/mL) Final   POC Methamphetamine UR 08/04/2023 None Detected  NONE DETECTED (Cut Off Level 1000 ng/mL) Final   POC Morphine 08/04/2023 None Detected  NONE DETECTED (Cut Off Level 300 ng/mL) Final   POC Methadone UR 08/04/2023 None Detected  NONE DETECTED (Cut Off Level 300 ng/mL) Final   POC Oxycodone UR 08/04/2023 None Detected  NONE DETECTED (Cut Off Level 100 ng/mL) Final   POC Marijuana UR 08/04/2023 Positive (A)  NONE DETECTED (Cut Off Level 50 ng/mL) Final   Cocaine Metab  Quant, Ur 08/04/2023 Positive (A)  Cutoff=300 Final   Benzoylecgonine GC/MS Conf 08/04/2023 35,080  Cutoff=150 ng/mL Final   Comment: (NOTE) Performed At: [facility] [address] [place], [place] [id number] Avis Epley PhD [id number]    Ethanol, Urine 08/04/2023 FID) (A)  Cutoff=0.020 Final   Comment: Positive Confirmation performed by Gas Chromatography (GC    Ethanol, Ur - Confirmation 08/04/2023 0.099  Cutoff=0.020 % Final   Comment: (NOTE) GLUCOSE NEGATIVE Performed At: [facility] [address] [place], [place] [id number] Avis Epley PhD [id number]   Admission on 06/15/2023, Discharged on 06/16/2023  Component Date Value Ref Range Status   WBC 06/15/2023 5.8  4.0 - 10.5 K/uL Final   RBC 06/15/2023 4.61  4.22 - 5.81 MIL/uL Final   Hemoglobin 06/15/2023 15.5  13.0 - 17.0 g/dL Final   HCT 55/73/2202 46.0  39.0 - 52.0 % Final   MCV 06/15/2023 99.8  80.0 - 100.0 fL Final   MCH 06/15/2023 33.6  26.0 - 34.0 pg Final   MCHC 06/15/2023 33.7  30.0 - 36.0 g/dL Final   RDW 54/27/0623 13.3  11.5 - 15.5 % Final   Platelets 06/15/2023 244  150 - 400 K/uL Final   nRBC 06/15/2023 0.0  0.0 - 0.2 % Final   Neutrophils Relative % 06/15/2023 56  % Final   Neutro Abs 06/15/2023 3.3  1.7 - 7.7 K/uL Final   Lymphocytes Relative 06/15/2023 33  % Final   Lymphs Abs 06/15/2023 1.9  0.7 - 4.0 K/uL Final   Monocytes Relative 06/15/2023 6  % Final   Monocytes  Absolute 06/15/2023 0.4  0.1 - 1.0 K/uL Final   Eosinophils Relative 06/15/2023 2  % Final   Eosinophils Absolute 06/15/2023 0.1  0.0 - 0.5 K/uL Final   Basophils Relative 06/15/2023 3  % Final   Basophils Absolute 06/15/2023 0.2 (H)  0.0 - 0.1 K/uL Final   Immature Granulocytes 06/15/2023 0  % Final   Abs Immature Granulocytes 06/15/2023 0.01  0.00 - 0.07 K/uL Final   Performed at [facility], [address], [place], [place] [address]   Sodium 06/15/2023 140  135 - 145 mmol/L Final   Potassium 06/15/2023 3.8  3.5 - 5.1 mmol/L Final   Chloride 06/15/2023 103  98 - 111 mmol/L Final   CO2 06/15/2023 28  22 - 32 mmol/L Final   Glucose, Bld 06/15/2023 108 (H)  70 - 99 mg/dL Final   Glucose reference range applies only to samples taken after fasting for at least 8 hours.   BUN 06/15/2023 5 (L)  6 - 20 mg/dL Final   Creatinine, Ser 06/15/2023 0.65  0.61 - 1.24 mg/dL Final   Calcium 15/17/6160 8.3 (L)  8.9 - 10.3 mg/dL Final   Total Protein 73/71/0626 8.6 (H)  6.5 - 8.1 g/dL Final   Albumin 94/85/4627 3.6  3.5 - 5.0 g/dL Final   AST 03/50/0938 296 (H)  15 - 41 U/L Final   ALT 06/15/2023 205 (H)  0 - 44 U/L Final   Alkaline Phosphatase 06/15/2023 145 (H)  38 - 126 U/L Final   Total Bilirubin 06/15/2023 0.4  0.3 - 1.2 mg/dL Final   GFR, Estimated 06/15/2023 >60  >60 mL/min Final   Comment: (NOTE) Calculated using the CKD-EPI Creatinine Equation (2021)    Anion gap 06/15/2023 9  5 - 15 Final   Performed at [facility], [address] [place], [place] [address]  Lipase 06/15/2023 27  11 - 51 U/L Final   Performed at [facility], [address], [place], [place] [address]   Specimen Source 06/16/2023 URINE, CLEAN CATCH   Final   Color, Urine 06/16/2023 YELLOW  YELLOW Final   APPearance 06/16/2023 HAZY (A)  CLEAR Final   Specific Gravity, Urine 06/16/2023 1.015  1.005 - 1.030 Final   pH 06/16/2023 6.0  5.0 - 8.0 Final   Glucose, UA  06/16/2023 NEGATIVE  NEGATIVE mg/dL Final   Hgb urine dipstick 06/16/2023 NEGATIVE  NEGATIVE Final   Bilirubin Urine 06/16/2023 NEGATIVE  NEGATIVE Final   Ketones, ur 06/16/2023 NEGATIVE  NEGATIVE mg/dL Final   Protein, ur 47/82/9562 NEGATIVE  NEGATIVE mg/dL Final   Nitrite 13/05/6577 NEGATIVE  NEGATIVE Final   Leukocytes,Ua 06/16/2023 NEGATIVE  NEGATIVE Final   RBC / HPF 06/16/2023 0-5  0 - 5 RBC/hpf Final   WBC, UA 06/16/2023 0-5  0 - 5 WBC/hpf Final   Comment:        Reflex urine culture not performed if WBC <=10, OR if Squamous epithelial cells >5. If Squamous epithelial cells >5 suggest recollection.    Bacteria, UA 06/16/2023 NONE SEEN  NONE SEEN Final   Squamous Epithelial / HPF 06/16/2023 0-5  0 - 5 /HPF Final   Mucus 06/16/2023 PRESENT   Final   Performed at [facility], [address] [place], [place] [address]   Alcohol, Ethyl (B) 06/15/2023 288 (H)  <10 mg/dL Final   Comment: (NOTE) Lowest detectable limit for serum alcohol is 10 mg/dL.  For medical purposes only. Performed at [facility], [address], [place], [place] [address]    Salicylate Lvl 06/15/2023 <7.0 (L)  7.0 - 30.0 mg/dL Final   Performed at [facility], [address] [place], [place] [address]   Acetaminophen (Tylenol), Serum 06/15/2023 <10 (L)  10 - 30 ug/mL Final   Comment: (NOTE) Therapeutic concentrations vary significantly. A range of 10-30 ug/mL  may be an effective concentration for many patients. However, some  are best treated at concentrations outside of this range. Acetaminophen concentrations >150 ug/mL at 4 hours after ingestion  and >50 ug/mL at 12 hours after ingestion are often associated with  toxic reactions.  Performed at [facility], [address] [place], [place] [address]    Opiates 06/16/2023 NONE DETECTED  NONE DETECTED Final   Cocaine 06/16/2023 POSITIVE (A)  NONE DETECTED Final    Benzodiazepines 06/16/2023 NONE DETECTED  NONE DETECTED Final   Amphetamines 06/16/2023 NONE DETECTED  NONE DETECTED Final   Tetrahydrocannabinol 06/16/2023 POSITIVE (A)  NONE DETECTED Final   Barbiturates 06/16/2023 NONE DETECTED  NONE DETECTED Final   Comment: (NOTE) DRUG SCREEN FOR MEDICAL PURPOSES ONLY.  IF CONFIRMATION IS NEEDED FOR ANY PURPOSE, NOTIFY LAB WITHIN 5 DAYS.  LOWEST DETECTABLE LIMITS FOR URINE DRUG SCREEN Drug Class                     Cutoff (ng/mL) Amphetamine and metabolites    1000 Barbiturate and metabolites    200 Benzodiazepine                 200 Opiates and metabolites        300 Cocaine and metabolites        300 THC  50 Performed at [facility], [address], [place], [place] [address]     Allergies: Codeine  Medications:  Facility Ordered Medications  Medication   acetaminophen (TYLENOL) tablet 650 mg   alum & mag hydroxide-simeth (MAALOX/MYLANTA) 200-200-20 MG/5ML suspension 30 mL   magnesium hydroxide (MILK OF MAGNESIA) suspension 30 mL   thiamine (VITAMIN B1) tablet 100 mg   multivitamin with minerals tablet 1 tablet   LORazepam (ATIVAN) tablet 1 mg   hydrOXYzine (ATARAX) tablet 25 mg   loperamide (IMODIUM) capsule 2-4 mg   ondansetron (ZOFRAN-ODT) disintegrating tablet 4 mg   haloperidol (HALDOL) tablet 5 mg   And   diphenhydrAMINE (BENADRYL) capsule 50 mg   haloperidol lactate (HALDOL) injection 5 mg   And   diphenhydrAMINE (BENADRYL) injection 50 mg   And   LORazepam (ATIVAN) injection 2 mg   haloperidol lactate (HALDOL) injection 10 mg   And   diphenhydrAMINE (BENADRYL) injection 50 mg   And   LORazepam (ATIVAN) injection 2 mg   traZODone (DESYREL) tablet 50 mg   gabapentin (NEURONTIN) capsule 400 mg   nicotine polacrilex (NICORETTE) gum 2 mg   dicyclomine (BENTYL) tablet 20 mg   methocarbamol (ROBAXIN) tablet 500 mg   naproxen (NAPROSYN) tablet 500 mg   cloNIDine (CATAPRES)  tablet 0.1 mg   Followed by   Melene Muller ON 12/10/2023] cloNIDine (CATAPRES) tablet 0.1 mg   Followed by   Melene Muller ON 12/12/2023] cloNIDine (CATAPRES) tablet 0.1 mg   [COMPLETED] thiamine (VITAMIN B1) injection 100 mg   LORazepam (ATIVAN) tablet 1 mg   Followed by   Melene Muller ON 12/09/2023] LORazepam (ATIVAN) tablet 1 mg   Followed by   Melene Muller ON 12/10/2023] LORazepam (ATIVAN) tablet 1 mg   Followed by   Melene Muller ON 12/11/2023] LORazepam (ATIVAN) tablet 1 mg   PTA Medications  Medication Sig   DULoxetine (CYMBALTA) 30 MG capsule Take 1 capsule (30 mg total) by mouth daily. (Patient not taking: Reported on 12/07/2023)   gabapentin (NEURONTIN) 300 MG capsule Take 2 capsules (600 mg total) by mouth 3 (three) times daily.   hydrOXYzine (ATARAX) 25 MG tablet Take 1 tablet (25 mg total) by mouth every 6 (six) hours as needed for anxiety.   nicotine (NICODERM CQ - DOSED IN MG/24 HR) 7 mg/24hr patch Place 1 patch (7 mg total) onto the skin daily.   traZODone (DESYREL) 50 MG tablet Take 1 tablet (50 mg total) by mouth at bedtime as needed for sleep.   acetaminophen (TYLENOL) 325 MG tablet Take 2 tablets (650 mg total) by mouth every 6 (six) hours as needed for mild pain (pain score 1-3).   naproxen sodium (ALEVE) 220 MG tablet Take 220 mg by mouth.   predniSONE (DELTASONE) 20 MG tablet Take 2 tablets (40 mg total) by mouth daily with breakfast for 5 days.    Long Term Goals: Improvement in symptoms so as ready for discharge  Short Term Goals: Patient will verbalize feelings in meetings with treatment team members., Patient will attend at least of 50% of the groups daily., and Pt will complete the PHQ9 on admission, day 3 and discharge.  Medical Decision Making  Pt is appropriate for treatment at [facility] for [facility].  UDS + cocaine, benzodiazepines and THC.     New Orders Placed:  -Continued gabapentin 400 mg TID for nerve pain in left leg.    Polysubstance abuse -COWS with clonidine  taper -CIWA with Ativan taper, low threshold to discontinue  taper and continue with symptom triggered therapy.  Will monitor CIWA's for today   Lab and imaging reviewed and include:      SARS Coronavirus 2 by RT PCR (hospital order, performed in [facility] hospital lab) *cepheid single result test* Anterior Nasal Swab         Ethanol         CBC with Differential         Comprehensive metabolic panel         Lipid panel         Magnesium         TSH         Hemoglobin A1c         POCT Urine Drug Screen - (I-Screen)      Recommendations  Based on my evaluation the patient does not appear to have an emergency medical condition.  Lorri Frederick, MD 12/08/23  1:41 PM

## 2023-12-08 NOTE — [facility] Group Notes (Signed)
 SPIRITUALITY GROUP NOTE  Spirituality group facilitated by Wilkie Aye, MDiv, [facility].  Group Description: Group focused on topic of hope. Patients participated in facilitated discussion around topic, connecting with one another around experiences and definitions for hope. Group members engaged with visual explorer photos, reflecting on what hope looks like for them today. Group engaged in discussion around how their definitions of hope are present today in hospital.  Modalities: Psycho-social ed, Adlerian, Narrative, MI  Patient Progress: DID NOT ATTEND

## 2023-12-08 NOTE — Progress Notes (Signed)
 Pt self isolated in his room but woke up for meals. Pt refused his PM Gabapentin. No distress noted or concerns voiced. Staff will monitor for pt's safety.

## 2023-12-09 DIAGNOSIS — G8921 Chronic pain due to trauma: Secondary | ICD-10-CM | POA: Diagnosis not present

## 2023-12-09 DIAGNOSIS — R262 Difficulty in walking, not elsewhere classified: Secondary | ICD-10-CM | POA: Diagnosis not present

## 2023-12-09 DIAGNOSIS — F1510 Other stimulant abuse, uncomplicated: Secondary | ICD-10-CM | POA: Diagnosis not present

## 2023-12-09 DIAGNOSIS — F1929 Other psychoactive substance dependence with unspecified psychoactive substance-induced disorder: Secondary | ICD-10-CM | POA: Diagnosis not present

## 2023-12-09 NOTE — ED Notes (Signed)
 Patient resting with eyes closed in no apparent acute distress. Respirations even and unlabored. Environment secured. Safety checks in place according to facility policy.

## 2023-12-09 NOTE — Group Note (Signed)
 Group Topic: Relaxation  Group Date: 12/09/2023 Start Time: 1000 End Time: 1100 Facilitators: Londell Moh, NT  Department: [facility]  Number of Participants: 10  Group Focus: art therapy and check in Treatment Modality:  Art Therapy and Psychoeducation Interventions utilized were group exercise Purpose: enhance coping skills  Name: Mark Hammond Date of Birth: Apr 11, 1991  MR: [id number]    Level of Participation: Pt did not attend group.  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

## 2023-12-09 NOTE — ED Notes (Signed)
 Patient is in the bedroom sleeping. NAD. Respirations even and unlabored. Will monitor for safety.

## 2023-12-09 NOTE — Group Note (Signed)
 Group Topic: Relapse and Recovery  Group Date: 12/09/2023 Start Time: 2005 End Time: 2100 Facilitators: Darin Engels  Department: [facility]  Number of Participants: 4  Group Focus: acceptance, coping skills, goals/reality orientation, relapse prevention, self-awareness, and substance abuse education Treatment Modality:  Leisure Development Interventions utilized were reminiscence, story telling, and support Purpose: enhance coping skills, express feelings, increase insight, relapse prevention strategies, and trigger / craving management  Name: Mark Hammond Date of Birth: 1991-06-05  MR: [id number]    Level of Participation: pt did not attend group  Quality of Participation: pt did not attend group  Interactions with others: pt had positive interactions with staff at bedside  Mood/Affect: appropriate and positive Triggers (if applicable): n/a Cognition: coherent/clear Progress: Gaining insight Response: pt has limited lower extremity mobility and uses a walker, he declined going to group due to pain associated with mobility.  Plan: patient will be encouraged to try to attend group.   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

## 2023-12-09 NOTE — ED Notes (Signed)
 Patient sitting in dayroom interacting with peers, patient is eating lunch. No acute distress noted. No concerns voiced. Informed patient to notify staff with any needs or assistance. Patient verbalized understanding or agreement. Safety checks in place per facility policy.

## 2023-12-09 NOTE — ED Notes (Signed)
 Pt was provided lunch

## 2023-12-09 NOTE — ED Notes (Signed)
 Patient is resting in bed with eyes closed, even unlabored breathing. No s/s of discomfort. Patient will be continued to be monitored for safety per protocol and for changes in condition.

## 2023-12-09 NOTE — ED Notes (Signed)
 Pt was provided dinner.

## 2023-12-09 NOTE — ED Provider Notes (Signed)
 Facility Based Crisis Progress Note  Date: 12/09/23 Patient Name: Mark Hammond MRN: [id number] Chief Complaint: Depression, substance use  Diagnoses:  Final diagnoses:  Polysubstance (including opioids) dependence, daily use (HCC)  Stimulant use disorder  Tobacco use disorder  Substance induced mood disorder (HCC)  Severe episode of recurrent major depressive disorder, without psychotic features (HCC)  Chronic pain due to trauma  Ambulatory dysfunction  Alcohol use disorder   Mark Hammond is a 33 yo male with a history of polysubstance use disorder (alcohol, tobacco cocaine, [employer], and cannabis), schizoaffective disorder, self-reported bipolar disorder who initially presented to the [facility] on 12/06/2023 voluntarily for complaints of suicidal ideations and requesting detox from ongoing alcohol, cocaine, and cannabis use.  Patient expressed interest in transitioning to a residential rehabilitation program and was admitted to [facility] on 12/08/2023 to complete detox.  . Medical hx of chronic left leg pain for which he was prescribed Gabapentin and Naproxen. He has been medically assessed multiple times for this leg pain.  Patient was discharged from the [facility] back in February 2025 and reports relapsing on substances shortly after discharge.  Interval history Patient seen and assessed at bedside.  Denies SI/HI/AVH.  Reports eating and sleeping fairly well.  Reports not being able to go to [facility] due to not having his own assistance devices to help him with walking.  He reports that he now has crutches so he would be able to go to [facility].  He was not officially admitted to [facility] so he should still be able to qualify for it.  He was amenable to continue medication as prescribed and states that they are helpful for management of his anxiety.  He reports minimal withdrawal symptoms at this time.  Psychiatric ROS Mood Symptoms 66-month history of depressed mood, anhedonia, fatigue, diminished concentration,  feelings of worthlessness/hopelessness/guilt, chronic SI without intent or plan Manic Symptoms Reports prior diagnosis of bipolar 1 disorder, with prior manic episodes, although patient is unable to distinguish true periods of expansive energy or mood outside of substance use since he has never had a significant period of sobriety. Anxiety Symptoms Not formally assessed Trauma Symptoms Not formally assessed Psychosis Symptoms Denied AVH, paranoia, delusional thought processes  Substance Use Hx: Alcohol: started at age 72. 1/5th and 4 40 oz, 2 16 oz, and 2 cartons a  wine daily.  [facility] at [facility] time.  Dneies seizures or DTs Tobacco: 3 cigarretes daily, started ta age 33 Cannabis:1 bowl of weed a day, smokes it daily - slows his usage of other, appetite Cocaine: smokes, $40 daily Methamphetamines: denies Opiates (fentanyl / heroin): oxycodone and percocets, depending on what is available to him . Reports using approximately 4 pills daily.  IVDU: denies  Past Psychiatric Hx: No current outpatient psychiatric follow-up Patient reports being previously diagnosed with bipolar disorder and depression. No current psychotropic medications Unable to recall  Past Medical History: PCP:No Medical Dx: Chronic unspecified left leg injury Allergies: codeine Trauma:Denies Seizures:Denies  Family Medical History: Unknown to pt  Family Psychiatric History: Unknown to pt  Social History: Patient lives in [place] with girlfriend Social Support: girlfriend Occupational MV:[id number] Children:denies   PHQ 2-9:  Flowsheet Row ED from 12/08/2023 in [facility] ED from 11/21/2023 in [facility]  Thoughts that you would be better off dead, or of hurting yourself in some way More than half the days Not at all  PHQ-9 Total Score 20 8  Flowsheet Row ED from 12/08/2023 in [facility] ED from  12/06/2023 in [facility] ED from 12/04/2023 in [facility] Emergency Department at [facility]  C-SSRS RISK CATEGORY No Risk No Risk High Risk       Screenings    Flowsheet Row Most Recent Value  CIWA-Ar Total 3  COWS Total Score 8       Total Time spent with patient: 1.5 hours  Psychiatric Specialty Exam  Presentation General Appearance:  Disheveled (Malodorous)  Eye Contact: Good  Speech: Clear and Coherent; Normal Rate  Speech Volume: Decreased  Handedness: -- (not assessed)   Mood and Affect  Mood: Depressed; Irritable  Affect: Congruent   Thought Process  Thought Processes: Linear  Descriptions of Associations:Intact  Orientation:None  Thought Content:Logical  Diagnosis of Schizophrenia or Schizoaffective disorder in past: Yes   Hallucinations:Hallucinations: None  Ideas of Reference:None  Suicidal Thoughts:Suicidal Thoughts: Yes, Passive SI Passive Intent and/or Plan: Without Intent; Without Plan; Without Access to Means; Without Means to Carry Out  Homicidal Thoughts:Homicidal Thoughts: No   Sensorium  Memory: Immediate Fair; Recent Fair; Remote Fair  Judgment: Poor  Insight: Poor   Executive Functions  Concentration: Fair  Attention Span: Fair  Recall: [employer] of Knowledge: Fair  Language: Fair   Psychomotor Activity  Psychomotor Activity: Psychomotor Activity: Normal   Assets  Assets: Desire for Improvement; Resilience; Communication Skills   Sleep  Sleep: Sleep: Fair   Nutritional Assessment (For [facility] and [facility] admissions only) Has the patient had a weight loss or gain of 10 pounds or more in the last 3 months?: No Has the patient had a decrease in food intake/or appetite?: No Does the patient have dental problems?: No Does the patient have eating habits or behaviors that may be indicators of an eating disorder including binging or inducing vomiting?: No Has the  patient recently lost weight without trying?: 0 Has the patient been eating poorly because of a decreased appetite?: 0 Malnutrition Screening Tool Score: 0    Physical Exam Vitals and nursing note reviewed.  Constitutional:      General: He is not in acute distress.    Appearance: He is not ill-appearing.  HENT:     Head: Normocephalic and atraumatic.  Eyes:     Extraocular Movements: Extraocular movements intact.     Conjunctiva/sclera: Conjunctivae normal.  Pulmonary:     Effort: Pulmonary effort is normal. No respiratory distress.  Skin:    General: Skin is warm and dry.  Neurological:     General: No focal deficit present.    Review of Systems  All other systems reviewed and are negative.   Blood pressure 120/88, pulse 71, temperature 98.5 F (36.9 C), temperature source Oral, resp. rate 18, SpO2 100%. There is no height or weight on file to calculate BMI.    Last Labs:  Admission on 12/08/2023  Component Date Value Ref Range Status   Opiates 12/08/2023 NONE DETECTED  NONE DETECTED Final   Cocaine 12/08/2023 NONE DETECTED  NONE DETECTED Final   Benzodiazepines 12/08/2023 POSITIVE (A)  NONE DETECTED Final   Amphetamines 12/08/2023 NONE DETECTED  NONE DETECTED Final   Tetrahydrocannabinol 12/08/2023 POSITIVE (A)  NONE DETECTED Final   Barbiturates 12/08/2023 NONE DETECTED  NONE DETECTED Final   Comment: (NOTE) DRUG SCREEN FOR MEDICAL PURPOSES ONLY.  IF CONFIRMATION IS NEEDED FOR ANY PURPOSE, NOTIFY LAB WITHIN 5 DAYS.  LOWEST DETECTABLE LIMITS FOR URINE DRUG SCREEN Drug  Class                     Cutoff (ng/mL) Amphetamine and metabolites    1000 Barbiturate and metabolites    200 Benzodiazepine                 200 Opiates and metabolites        300 Cocaine and metabolites        300 THC                            50 Performed at [facility] Lab, [address] [place], [place] [address]   Admission on 12/06/2023, Discharged on 12/08/2023  Component  Date Value Ref Range Status   Alcohol, Ethyl (B) 12/06/2023 <10  <10 mg/dL Final   Comment: (NOTE) Lowest detectable limit for serum alcohol is 10 mg/dL.  For medical purposes only. Performed at [facility] Lab, [address], [place], [place] [address]    POC Amphetamine UR 12/06/2023 None Detected  NONE DETECTED (Cut Off Level 1000 ng/mL) Final   POC Secobarbital (BAR) 12/06/2023 None Detected  NONE DETECTED (Cut Off Level 300 ng/mL) Final   POC Buprenorphine (BUP) 12/06/2023 None Detected  NONE DETECTED (Cut Off Level 10 ng/mL) Final   POC Oxazepam (BZO) 12/06/2023 Positive (A)  NONE DETECTED (Cut Off Level 300 ng/mL) Final   POC Cocaine UR 12/06/2023 Positive (A)  NONE DETECTED (Cut Off Level 300 ng/mL) Final   POC Methamphetamine UR 12/06/2023 None Detected  NONE DETECTED (Cut Off Level 1000 ng/mL) Final   POC Morphine 12/06/2023 None Detected  NONE DETECTED (Cut Off Level 300 ng/mL) Final   POC Methadone UR 12/06/2023 None Detected  NONE DETECTED (Cut Off Level 300 ng/mL) Final   POC Oxycodone UR 12/06/2023 None Detected  NONE DETECTED (Cut Off Level 100 ng/mL) Final   POC Marijuana UR 12/06/2023 Positive (A)  NONE DETECTED (Cut Off Level 50 ng/mL) Final   WBC 12/08/2023 4.4  4.0 - 10.5 K/uL Final   RBC 12/08/2023 3.53 (L)  4.22 - 5.81 MIL/uL Final   Hemoglobin 12/08/2023 11.2 (L)  13.0 - 17.0 g/dL Final   HCT 09/81/1914 32.6 (L)  39.0 - 52.0 % Final   MCV 12/08/2023 92.4  80.0 - 100.0 fL Final   MCH 12/08/2023 31.7  26.0 - 34.0 pg Final   MCHC 12/08/2023 34.4  30.0 - 36.0 g/dL Final   RDW 78/29/5621 13.9  11.5 - 15.5 % Final   Platelets 12/08/2023 280  150 - 400 K/uL Final   nRBC 12/08/2023 0.0  0.0 - 0.2 % Final   Neutrophils Relative % 12/08/2023 59  % Final   Neutro Abs 12/08/2023 2.6  1.7 - 7.7 K/uL Final   Lymphocytes Relative 12/08/2023 27  % Final   Lymphs Abs 12/08/2023 1.2  0.7 - 4.0 K/uL Final   Monocytes Relative 12/08/2023 9  % Final   Monocytes Absolute  12/08/2023 0.4  0.1 - 1.0 K/uL Final   Eosinophils Relative 12/08/2023 3  % Final   Eosinophils Absolute 12/08/2023 0.1  0.0 - 0.5 K/uL Final   Basophils Relative 12/08/2023 1  % Final   Basophils Absolute 12/08/2023 0.1  0.0 - 0.1 K/uL Final   Immature Granulocytes 12/08/2023 1  % Final   Abs Immature Granulocytes 12/08/2023 0.02  0.00 - 0.07 K/uL Final   Performed at [name], [address] [place], [place] [address]   Sodium 12/08/2023 139  135 - 145 mmol/L Final   Potassium 12/08/2023 4.0  3.5 - 5.1 mmol/L Final   Chloride 12/08/2023 100  98 - 111 mmol/L Final   CO2 12/08/2023 27  22 - 32 mmol/L Final   Glucose, Bld 12/08/2023 114 (H)  70 - 99 mg/dL Final   Glucose reference range applies only to samples taken after fasting for at least 8 hours.   BUN 12/08/2023 9  6 - 20 mg/dL Final   Creatinine, Ser 12/08/2023 0.60 (L)  0.61 - 1.24 mg/dL Final   Calcium 09/81/1914 8.7 (L)  8.9 - 10.3 mg/dL Final   Total Protein 78/29/5621 6.2 (L)  6.5 - 8.1 g/dL Final   Albumin 30/86/5784 2.3 (L)  3.5 - 5.0 g/dL Final   AST 69/62/9528 101 (H)  15 - 41 U/L Final   ALT 12/08/2023 55 (H)  0 - 44 U/L Final   Alkaline Phosphatase 12/08/2023 125  38 - 126 U/L Final   Total Bilirubin 12/08/2023 0.4  0.0 - 1.2 mg/dL Final   GFR, Estimated 12/08/2023 >60  >60 mL/min Final   Comment: (NOTE) Calculated using the CKD-EPI Creatinine Equation (2021)    Anion gap 12/08/2023 12  5 - 15 Final   Performed at [facility] Lab, [address], [place], [place] [address]   Cholesterol 12/08/2023 96  0 - 200 mg/dL Final   Triglycerides 40/07/2724 55  <150 mg/dL Final   HDL 36/64/4034 34 (L)  >40 mg/dL Final   Total CHOL/HDL Ratio 12/08/2023 2.8  RATIO Final   VLDL 12/08/2023 11  0 - 40 mg/dL Final   LDL Cholesterol 12/08/2023 51  0 - 99 mg/dL Final   Comment:        Total Cholesterol/HDL:CHD Risk Coronary Heart Disease Risk Table                     Men   Women  1/2 Average Risk   3.4   3.3   Average Risk       5.0   4.4  2 X Average Risk   9.6   7.1  3 X Average Risk  23.4   11.0        Use the calculated Patient Ratio above and the CHD Risk Table to determine the patient's CHD Risk.        ATP III CLASSIFICATION (LDL):  <100     mg/dL   Optimal  742-595  mg/dL   Near or Above                    Optimal  130-159  mg/dL   Borderline  638-756  mg/dL   High  >433     mg/dL   Very High Performed at [facility] Lab, [address], [place], [place] [address]    Magnesium 12/08/2023 1.7  1.7 - 2.4 mg/dL Final   Performed at [facility], [address], [place], [place] [address]   TSH 12/08/2023 0.714  0.350 - 4.500 uIU/mL Final   Comment: Performed by a 3rd Generation assay with a functional sensitivity of <=0.01 uIU/mL. Performed at [facility], [address], [place], [place] [address]    Hgb A1c MFr Bld 12/08/2023 6.0 (H)  4.8 - 5.6 % Final   Comment: (NOTE) Pre diabetes:          5.7%-6.4%  Diabetes:              >  6.4%  Glycemic control for   <7.0% adults with diabetes    Mean Plasma Glucose 12/08/2023 125.5  mg/dL Final   Performed at [facility], [address], [place], [place] [address]  Admission on 12/04/2023, Discharged on 12/04/2023  Component Date Value Ref Range Status   Color, Urine 12/04/2023 YELLOW  YELLOW Final   APPearance 12/04/2023 CLEAR  CLEAR Final   Specific Gravity, Urine 12/04/2023 1.029  1.005 - 1.030 Final   pH 12/04/2023 5.0  5.0 - 8.0 Final   Glucose, UA 12/04/2023 NEGATIVE  NEGATIVE mg/dL Final   Hgb urine dipstick 12/04/2023 NEGATIVE  NEGATIVE Final   Bilirubin Urine 12/04/2023 NEGATIVE  NEGATIVE Final   Ketones, ur 12/04/2023 NEGATIVE  NEGATIVE mg/dL Final   Protein, ur 57/84/6962 NEGATIVE  NEGATIVE mg/dL Final   Nitrite 95/28/4132 NEGATIVE  NEGATIVE Final   Leukocytes,Ua 12/04/2023 NEGATIVE  NEGATIVE Final   Performed at [facility], [address], [place], [place] [address]   Admission on 11/27/2023, Discharged on 11/28/2023  Component Date Value Ref Range Status   WBC 11/27/2023 13.5 (H)  4.0 - 10.5 K/uL Final   RBC 11/27/2023 4.12 (L)  4.22 - 5.81 MIL/uL Final   Hemoglobin 11/27/2023 12.8 (L)  13.0 - 17.0 g/dL Final   HCT 27/25/3664 38.4 (L)  39.0 - 52.0 % Final   MCV 11/27/2023 93.2  80.0 - 100.0 fL Final   MCH 11/27/2023 31.1  26.0 - 34.0 pg Final   MCHC 11/27/2023 33.3  30.0 - 36.0 g/dL Final   RDW 40/34/7425 13.4  11.5 - 15.5 % Final   Platelets 11/27/2023 375  150 - 400 K/uL Final   nRBC 11/27/2023 0.0  0.0 - 0.2 % Final   Performed at [facility], [address], [place], [place] [address]   Sodium 11/27/2023 132 (L)  135 - 145 mmol/L Final   Potassium 11/27/2023 4.3  3.5 - 5.1 mmol/L Final   Chloride 11/27/2023 95 (L)  98 - 111 mmol/L Final   CO2 11/27/2023 21 (L)  22 - 32 mmol/L Final   Glucose, Bld 11/27/2023 113 (H)  70 - 99 mg/dL Final   Glucose reference range applies only to samples taken after fasting for at least 8 hours.   BUN 11/27/2023 15  6 - 20 mg/dL Final   Creatinine, Ser 11/27/2023 0.86  0.61 - 1.24 mg/dL Final   Calcium 75/64/3329 9.5  8.9 - 10.3 mg/dL Final   GFR, Estimated 11/27/2023 >60  >60 mL/min Final   Comment: (NOTE) Calculated using the CKD-EPI Creatinine Equation (2021)    Anion gap 11/27/2023 16 (H)  5 - 15 Final   Performed at [facility], [address] [place], [place] [address]   Salicylate Lvl 11/27/2023 <7.0 (L)  7.0 - 30.0 mg/dL Final   Performed at [facility], [address] [place], [place] [address]   Acetaminophen (Tylenol), Serum 11/27/2023 10  10 - 30 ug/mL Final   Comment: (NOTE) Therapeutic concentrations vary significantly. A range of 10-30 ug/mL  may be an effective concentration for many patients. However, some  are best treated at concentrations outside of this range. Acetaminophen concentrations >150 ug/mL at 4 hours after ingestion  and >50 ug/mL at 12 hours after  ingestion are often associated with  toxic reactions.  Performed at [facility], [address] [place], [place] [address]    Alcohol, Ethyl (B) 11/27/2023 127 (H)  <10  mg/dL Final   Comment: (NOTE) Lowest detectable limit for serum alcohol is 10 mg/dL.  For medical purposes only. Performed at [facility], [address], [place], [place] [address]    Opiates 11/27/2023 POSITIVE (A)  NONE DETECTED Final   Cocaine 11/27/2023 POSITIVE (A)  NONE DETECTED Final   Benzodiazepines 11/27/2023 POSITIVE (A)  NONE DETECTED Final   Amphetamines 11/27/2023 NONE DETECTED  NONE DETECTED Final   Tetrahydrocannabinol 11/27/2023 POSITIVE (A)  NONE DETECTED Final   Barbiturates 11/27/2023 NONE DETECTED  NONE DETECTED Final   Comment: (NOTE) DRUG SCREEN FOR MEDICAL PURPOSES ONLY.  IF CONFIRMATION IS NEEDED FOR ANY PURPOSE, NOTIFY LAB WITHIN 5 DAYS.  LOWEST DETECTABLE LIMITS FOR URINE DRUG SCREEN Drug Class                     Cutoff (ng/mL) Amphetamine and metabolites    1000 Barbiturate and metabolites    200 Benzodiazepine                 200 Opiates and metabolites        300 Cocaine and metabolites        300 THC                            50 Performed at [facility] Lab, [address] [place], [place] [address]    Color, Urine 11/27/2023 AMBER (A)  YELLOW Final   BIOCHEMICALS MAY BE AFFECTED BY COLOR   APPearance 11/27/2023 CLEAR  CLEAR Final   Specific Gravity, Urine 11/27/2023 1.020  1.005 - 1.030 Final   pH 11/27/2023 5.0  5.0 - 8.0 Final   Glucose, UA 11/27/2023 NEGATIVE  NEGATIVE mg/dL Final   Hgb urine dipstick 11/27/2023 NEGATIVE  NEGATIVE Final   Bilirubin Urine 11/27/2023 NEGATIVE  NEGATIVE Final   Ketones, ur 11/27/2023 NEGATIVE  NEGATIVE mg/dL Final   Protein, ur 09/81/1914 NEGATIVE  NEGATIVE mg/dL Final   Nitrite 78/29/5621 NEGATIVE  NEGATIVE Final   Leukocytes,Ua 11/27/2023 NEGATIVE  NEGATIVE Final   Performed at [facility], [address], [place], [place] [address]   Neisseria Gonorrhea 11/27/2023 Negative   Final   Chlamydia 11/27/2023 Negative   Final   Comment 11/27/2023 Normal Reference Ranger Chlamydia - Negative   Final   Comment 11/27/2023 Normal Reference Range Neisseria Gonorrhea - Negative   Final  Admission on 11/21/2023, Discharged on 11/27/2023  Component Date Value Ref Range Status   SARS Coronavirus 2 by RT PCR 11/25/2023 NEGATIVE  NEGATIVE Final   Performed at [facility] Lab, [address] [place], [place] [address]   WBC 11/21/2023 5.6  4.0 - 10.5 K/uL Final   RBC 11/21/2023 4.14 (L)  4.22 - 5.81 MIL/uL Final   Hemoglobin 11/21/2023 13.1  13.0 - 17.0 g/dL Final   HCT 62/95/2841 38.8 (L)  39.0 - 52.0 % Final   MCV 11/21/2023 93.7  80.0 - 100.0 fL Final   MCH 11/21/2023 31.6  26.0 - 34.0 pg Final   MCHC 11/21/2023 33.8  30.0 - 36.0 g/dL Final   RDW 32/44/0102 13.2  11.5 - 15.5 % Final   Platelets 11/21/2023 298  150 - 400 K/uL Final   nRBC 11/21/2023 0.0  0.0 - 0.2 % Final   Neutrophils Relative % 11/21/2023 83  % Final   Neutro Abs 11/21/2023 4.6  1.7 - 7.7 K/uL Final   Lymphocytes Relative 11/21/2023  14  % Final   Lymphs Abs 11/21/2023 0.8  0.7 - 4.0 K/uL Final   Monocytes Relative 11/21/2023 3  % Final   Monocytes Absolute 11/21/2023 0.2  0.1 - 1.0 K/uL Final   Eosinophils Relative 11/21/2023 0  % Final   Eosinophils Absolute 11/21/2023 0.0  0.0 - 0.5 K/uL Final   Basophils Relative 11/21/2023 0  % Final   Basophils Absolute 11/21/2023 0.0  0.0 - 0.1 K/uL Final   Immature Granulocytes 11/21/2023 0  % Final   Abs Immature Granulocytes 11/21/2023 0.01  0.00 - 0.07 K/uL Final   Performed at [facility], [address], [place], [place] [address]   Sodium 11/21/2023 135  135 - 145 mmol/L Final   Potassium 11/21/2023 4.2  3.5 - 5.1 mmol/L Final   Chloride 11/21/2023 99  98 - 111 mmol/L Final   CO2 11/21/2023 26  22 - 32 mmol/L Final   Glucose, Bld 11/21/2023 162 (H)  70 - 99 mg/dL Final    Glucose reference range applies only to samples taken after fasting for at least 8 hours.   BUN 11/21/2023 7  6 - 20 mg/dL Final   Creatinine, Ser 11/21/2023 0.63  0.61 - 1.24 mg/dL Final   Calcium 60/45/4098 8.9  8.9 - 10.3 mg/dL Final   Total Protein 11/91/4782 8.1  6.5 - 8.1 g/dL Final   Albumin 95/62/1308 2.8 (L)  3.5 - 5.0 g/dL Final   AST 65/78/4696 91 (H)  15 - 41 U/L Final   ALT 11/21/2023 55 (H)  0 - 44 U/L Final   Alkaline Phosphatase 11/21/2023 87  38 - 126 U/L Final   Total Bilirubin 11/21/2023 0.8  0.0 - 1.2 mg/dL Final   GFR, Estimated 11/21/2023 >60  >60 mL/min Final   Comment: (NOTE) Calculated using the CKD-EPI Creatinine Equation (2021)    Anion gap 11/21/2023 10  5 - 15 Final   Performed at [facility], [address], [place], [place] [address]   Alcohol, Ethyl (B) 11/21/2023 <10  <10 mg/dL Final   Comment: (NOTE) Lowest detectable limit for serum alcohol is 10 mg/dL.  For medical purposes only. Performed at [facility] Lab, [address], [place], [place] [address]    TSH 11/21/2023 0.365  0.350 - 4.500 uIU/mL Final   Comment: Performed by a 3rd Generation assay with a functional sensitivity of <=0.01 uIU/mL. Performed at [facility] Lab, [address], [place], [place] [address]    POC Amphetamine UR 11/22/2023 None Detected  NONE DETECTED (Cut Off Level 1000 ng/mL) Final   POC Secobarbital (BAR) 11/22/2023 None Detected  NONE DETECTED (Cut Off Level 300 ng/mL) Final   POC Buprenorphine (BUP) 11/22/2023 None Detected  NONE DETECTED (Cut Off Level 10 ng/mL) Final   POC Oxazepam (BZO) 11/22/2023 Positive (A)  NONE DETECTED (Cut Off Level 300 ng/mL) Final   POC Cocaine UR 11/22/2023 Positive (A)  NONE DETECTED (Cut Off Level 300 ng/mL) Final   POC Methamphetamine UR 11/22/2023 None Detected  NONE DETECTED (Cut Off Level 1000 ng/mL) Final   POC Morphine 11/22/2023 None Detected  NONE DETECTED (Cut Off Level 300 ng/mL) Final   POC Methadone UR  11/22/2023 None Detected  NONE DETECTED (Cut Off Level 300 ng/mL) Final   POC Oxycodone UR 11/22/2023 None Detected  NONE DETECTED (Cut Off Level 100 ng/mL) Final   POC Marijuana UR 11/22/2023 Positive (A)  NONE DETECTED (Cut Off Level 50 ng/mL) Final  Hgb A1c MFr Bld 11/25/2023 5.9 (H)  4.8 - 5.6 % Final   Comment: (NOTE) Pre diabetes:          5.7%-6.4%  Diabetes:              >6.4%  Glycemic control for   <7.0% adults with diabetes    Mean Plasma Glucose 11/25/2023 122.63  mg/dL Final   Performed at [facility] Lab, [address], [place], [place] [address]   Sodium 11/25/2023 136  135 - 145 mmol/L Final   Potassium 11/25/2023 4.2  3.5 - 5.1 mmol/L Final   Chloride 11/25/2023 101  98 - 111 mmol/L Final   CO2 11/25/2023 27  22 - 32 mmol/L Final   Glucose, Bld 11/25/2023 108 (H)  70 - 99 mg/dL Final   Glucose reference range applies only to samples taken after fasting for at least 8 hours.   BUN 11/25/2023 9  6 - 20 mg/dL Final   Creatinine, Ser 11/25/2023 0.68  0.61 - 1.24 mg/dL Final   Calcium 60/45/4098 8.6 (L)  8.9 - 10.3 mg/dL Final   Total Protein 11/91/4782 7.0  6.5 - 8.1 g/dL Final   Albumin 95/62/1308 2.3 (L)  3.5 - 5.0 g/dL Final   AST 65/78/4696 81 (H)  15 - 41 U/L Final   ALT 11/25/2023 58 (H)  0 - 44 U/L Final   Alkaline Phosphatase 11/25/2023 84  38 - 126 U/L Final   Total Bilirubin 11/25/2023 0.7  0.0 - 1.2 mg/dL Final   GFR, Estimated 11/25/2023 >60  >60 mL/min Final   Comment: (NOTE) Calculated using the CKD-EPI Creatinine Equation (2021)    Anion gap 11/25/2023 8  5 - 15 Final   Performed at [facility] Lab, [address] [place], [place] [address]   Iron 11/25/2023 21 (L)  45 - 182 ug/dL Final   TIBC 41/32/4401 280  250 - 450 ug/dL Final   Saturation Ratios 11/25/2023 8 (L)  17.9 - 39.5 % Final   UIBC 11/25/2023 259  ug/dL Final   Performed at [facility] Lab, [address], [place], [place] [address]   Vitamin B-12 11/25/2023 549  180 - 914  pg/mL Final   Comment: (NOTE) This assay is not validated for testing neonatal or myeloproliferative syndrome specimens for Vitamin B12 levels. Performed at [facility] Lab, [address] [address] [address], [place], [place] [address]    Ferritin 11/25/2023 217  24 - 336 ng/mL Final   Performed at [facility], [address], [place], [place] [address]  Admission on 11/19/2023, Discharged on 11/20/2023  Component Date Value Ref Range Status   Sodium 11/19/2023 138  135 - 145 mmol/L Final   Potassium 11/19/2023 3.4 (L)  3.5 - 5.1 mmol/L Final   Chloride 11/19/2023 103  98 - 111 mmol/L Final   CO2 11/19/2023 26  22 - 32 mmol/L Final   Glucose, Bld 11/19/2023 123 (H)  70 - 99 mg/dL Final   Glucose reference range applies only to samples taken after fasting for at least 8 hours.   BUN 11/19/2023 <5 (L)  6 - 20 mg/dL Final   Creatinine, Ser 11/19/2023 0.50 (L)  0.61 - 1.24 mg/dL Final   Calcium 25/95/6387 8.2 (L)  8.9 - 10.3 mg/dL Final   GFR, Estimated 11/19/2023 >60  >60 mL/min Final   Comment: (NOTE) Calculated using the CKD-EPI Creatinine Equation (2021)    Anion gap 11/19/2023 9  5 - 15  Final   Performed at [facility], [address] [place], [place] [address]   WBC 11/19/2023 8.4  4.0 - 10.5 K/uL Final   RBC 11/19/2023 4.00 (L)  4.22 - 5.81 MIL/uL Final   Hemoglobin 11/19/2023 12.6 (L)  13.0 - 17.0 g/dL Final   HCT 29/56/2130 37.7 (L)  39.0 - 52.0 % Final   MCV 11/19/2023 94.3  80.0 - 100.0 fL Final   MCH 11/19/2023 31.5  26.0 - 34.0 pg Final   MCHC 11/19/2023 33.4  30.0 - 36.0 g/dL Final   RDW 86/57/8469 13.2  11.5 - 15.5 % Final   Platelets 11/19/2023 267  150 - 400 K/uL Final   nRBC 11/19/2023 0.0  0.0 - 0.2 % Final   Neutrophils Relative % 11/19/2023 65  % Final   Neutro Abs 11/19/2023 5.5  1.7 - 7.7 K/uL Final   Lymphocytes Relative 11/19/2023 22  % Final   Lymphs Abs 11/19/2023 1.9  0.7 - 4.0 K/uL Final   Monocytes Relative 11/19/2023 11  % Final    Monocytes Absolute 11/19/2023 0.9  0.1 - 1.0 K/uL Final   Eosinophils Relative 11/19/2023 1  % Final   Eosinophils Absolute 11/19/2023 0.1  0.0 - 0.5 K/uL Final   Basophils Relative 11/19/2023 1  % Final   Basophils Absolute 11/19/2023 0.1  0.0 - 0.1 K/uL Final   Immature Granulocytes 11/19/2023 0  % Final   Abs Immature Granulocytes 11/19/2023 0.03  0.00 - 0.07 K/uL Final   Performed at [facility], [address], [place], [place] [address]   Sed Rate 11/19/2023 77 (H)  0 - 16 mm/hr Final   Performed at [facility], [address], [place], [place] [address]   CRP 11/19/2023 4.2 (H)  <1.0 mg/dL Final   Performed at [facility], [address], [place], [place] [address]   Sodium 11/19/2023 141  135 - 145 mmol/L Final   Potassium 11/19/2023 3.5  3.5 - 5.1 mmol/L Final   Chloride 11/19/2023 101  98 - 111 mmol/L Final   BUN 11/19/2023 <3 (L)  6 - 20 mg/dL Final   Creatinine, Ser 11/19/2023 0.70  0.61 - 1.24 mg/dL Final   Glucose, Bld 27/25/3664 126 (H)  70 - 99 mg/dL Final   Glucose reference range applies only to samples taken after fasting for at least 8 hours.   Calcium, Ion 11/19/2023 1.05 (L)  1.15 - 1.40 mmol/L Final   TCO2 11/19/2023 27  22 - 32 mmol/L Final   Hemoglobin 11/19/2023 12.9 (L)  13.0 - 17.0 g/dL Final   HCT 40/34/7425 38.0 (L)  39.0 - 52.0 % Final   SARS Coronavirus 2 by RT PCR 11/19/2023 NEGATIVE  NEGATIVE Final   Comment: (NOTE) SARS-CoV-2 target nucleic acids are NOT DETECTED.  The SARS-CoV-2 RNA is generally detectable in upper respiratory specimens during the acute phase of infection. The lowest concentration of SARS-CoV-2 viral copies this assay can detect is 138 copies/mL. A negative result does not preclude SARS-Cov-2 infection and should not be used as the sole basis for treatment or other patient management decisions. A negative result may occur with  improper specimen collection/handling, submission of  specimen other than nasopharyngeal swab, presence of viral mutation(s) within the areas targeted by this assay, and inadequate number of viral copies(<138 copies/mL). A negative result must be combined with clinical observations, patient history, and epidemiological information. The expected result is Negative.  Fact Sheet for Patients:  BloggerCourse.com  Fact Sheet for Healthcare Providers:  SeriousBroker.it  This test is no                          t yet approved or cleared by the [place] FDA and  has been authorized for detection and/or diagnosis of SARS-CoV-2 by FDA under an Emergency Use Authorization (EUA). This EUA will remain  in effect (meaning this test can be used) for the duration of the COVID-19 declaration under Section 564(b)(1) of the Act, 21 U.S.C.section 360bbb-3(b)(1), unless the authorization is terminated  or revoked sooner.       Influenza A by PCR 11/19/2023 NEGATIVE  NEGATIVE Final   Influenza B by PCR 11/19/2023 NEGATIVE  NEGATIVE Final   Comment: (NOTE) The Xpert Xpress SARS-CoV-2/FLU/RSV plus assay is intended as an aid in the diagnosis of influenza from Nasopharyngeal swab specimens and should not be used as a sole basis for treatment. Nasal washings and aspirates are unacceptable for Xpert Xpress SARS-CoV-2/FLU/RSV testing.  Fact Sheet for Patients: BloggerCourse.com  Fact Sheet for Healthcare Providers: SeriousBroker.it  This test is not yet approved or cleared by the [place] FDA and has been authorized for detection and/or diagnosis of SARS-CoV-2 by FDA under an Emergency Use Authorization (EUA). This EUA will remain in effect (meaning this test can be used) for the duration of the COVID-19 declaration under Section 564(b)(1) of the Act, 21 U.S.C. section 360bbb-3(b)(1), unless the authorization is terminated or revoked.     Resp  Syncytial Virus by PCR 11/19/2023 NEGATIVE  NEGATIVE Final   Comment: (NOTE) Fact Sheet for Patients: BloggerCourse.com  Fact Sheet for Healthcare Providers: SeriousBroker.it  This test is not yet approved or cleared by the [place] FDA and has been authorized for detection and/or diagnosis of SARS-CoV-2 by FDA under an Emergency Use Authorization (EUA). This EUA will remain in effect (meaning this test can be used) for the duration of the COVID-19 declaration under Section 564(b)(1) of the Act, 21 U.S.C. section 360bbb-3(b)(1), unless the authorization is terminated or revoked.  Performed at [facility], [address], [place], [place] [address]   Admission on 11/09/2023, Discharged on 11/09/2023  Component Date Value Ref Range Status   Color, Urine 11/09/2023 AMBER (A)  YELLOW Final   BIOCHEMICALS MAY BE AFFECTED BY COLOR   APPearance 11/09/2023 CLEAR  CLEAR Final   Specific Gravity, Urine 11/09/2023 1.036 (H)  1.005 - 1.030 Final   pH 11/09/2023 5.0  5.0 - 8.0 Final   Glucose, UA 11/09/2023 NEGATIVE  NEGATIVE mg/dL Final   Hgb urine dipstick 11/09/2023 NEGATIVE  NEGATIVE Final   Bilirubin Urine 11/09/2023 SMALL (A)  NEGATIVE Final   Ketones, ur 11/09/2023 NEGATIVE  NEGATIVE mg/dL Final   Protein, ur 62/13/0865 30 (A)  NEGATIVE mg/dL Final   Nitrite 78/46/9629 NEGATIVE  NEGATIVE Final   Leukocytes,Ua 11/09/2023 NEGATIVE  NEGATIVE Final   RBC / HPF 11/09/2023 0-5  0 - 5 RBC/hpf Final   WBC, UA 11/09/2023 0-5  0 - 5 WBC/hpf Final   Bacteria, UA 11/09/2023 NONE SEEN  NONE SEEN Final   Squamous Epithelial / HPF 11/09/2023 0-5  0 - 5 /HPF Final   Mucus 11/09/2023 PRESENT   Final   Performed at [facility], [address] [place], [place] [address]   Opiates 11/09/2023 POSITIVE (A)  NONE DETECTED Final   Cocaine 11/09/2023 POSITIVE (A)  NONE DETECTED Final   Benzodiazepines 11/09/2023  POSITIVE (A)  NONE DETECTED Final   Amphetamines 11/09/2023 NONE DETECTED  NONE DETECTED Final   Tetrahydrocannabinol 11/09/2023 POSITIVE (A)  NONE DETECTED Final   Barbiturates 11/09/2023 NONE DETECTED  NONE DETECTED Final   Comment: (NOTE) DRUG SCREEN FOR MEDICAL PURPOSES ONLY.  IF CONFIRMATION IS NEEDED FOR ANY PURPOSE, NOTIFY LAB WITHIN 5 DAYS.  LOWEST DETECTABLE LIMITS FOR URINE DRUG SCREEN Drug Class                     Cutoff (ng/mL) Amphetamine and metabolites    1000 Barbiturate and metabolites    200 Benzodiazepine                 200 Opiates and metabolites        300 Cocaine and metabolites        300 THC                            50 Performed at [facility], [address], [place], [place] [address]   Admission on 08/10/2023, Discharged on 08/14/2023  Component Date Value Ref Range Status   Sodium 08/10/2023 137  135 - 145 mmol/L Final   Potassium 08/10/2023 3.4 (L)  3.5 - 5.1 mmol/L Final   Chloride 08/10/2023 100  98 - 111 mmol/L Final   CO2 08/10/2023 25  22 - 32 mmol/L Final   Glucose, Bld 08/10/2023 109 (H)  70 - 99 mg/dL Final   Glucose reference range applies only to samples taken after fasting for at least 8 hours.   BUN 08/10/2023 11  6 - 20 mg/dL Final   Creatinine, Ser 08/10/2023 0.71  0.61 - 1.24 mg/dL Final   Calcium 98/08/9146 8.8 (L)  8.9 - 10.3 mg/dL Final   Total Protein 82/95/6213 7.9  6.5 - 8.1 g/dL Final   Albumin 08/65/7846 3.8  3.5 - 5.0 g/dL Final   AST 96/29/5284 466 (H)  15 - 41 U/L Final   ALT 08/10/2023 264 (H)  0 - 44 U/L Final   Alkaline Phosphatase 08/10/2023 112  38 - 126 U/L Final   Total Bilirubin 08/10/2023 1.2 (H)  <1.2 mg/dL Final   GFR, Estimated 08/10/2023 >60  >60 mL/min Final   Comment: (NOTE) Calculated using the CKD-EPI Creatinine Equation (2021)    Anion gap 08/10/2023 12  5 - 15 Final   Performed at [facility], [address] [place], [place] [address]   Alcohol, Ethyl (B)  08/10/2023 144 (H)  <10 mg/dL Final   Comment: (NOTE) Lowest detectable limit for serum alcohol is 10 mg/dL.  For medical purposes only. Performed at [facility], [address], [place], [place] [address]    Salicylate Lvl 08/10/2023 <7.0 (L)  7.0 - 30.0 mg/dL Final   Performed at [facility], [address] [place], [place] [address]   Acetaminophen (Tylenol), Serum 08/10/2023 <10 (L)  10 - 30 ug/mL Final   Comment: (NOTE) Therapeutic concentrations vary significantly. A range of 10-30 ug/mL  may be an effective concentration for many patients. However, some  are best treated at concentrations outside of this range. Acetaminophen concentrations >150 ug/mL at 4 hours after ingestion  and >50 ug/mL at 12 hours after ingestion are often associated with  toxic reactions.  Performed at [facility], [address] [place], [place] [address]    WBC 08/10/2023 8.0  4.0 - 10.5  K/uL Final   RBC 08/10/2023 4.13 (L)  4.22 - 5.81 MIL/uL Final   Hemoglobin 08/10/2023 13.8  13.0 - 17.0 g/dL Final   HCT 16/07/9603 39.4  39.0 - 52.0 % Final   MCV 08/10/2023 95.4  80.0 - 100.0 fL Final   MCH 08/10/2023 33.4  26.0 - 34.0 pg Final   MCHC 08/10/2023 35.0  30.0 - 36.0 g/dL Final   RDW 54/06/8118 13.1  11.5 - 15.5 % Final   Platelets 08/10/2023 226  150 - 400 K/uL Final   nRBC 08/10/2023 0.0  0.0 - 0.2 % Final   Performed at [facility], [address], [place], [place] [address]   Opiates 08/10/2023 NONE DETECTED  NONE DETECTED Final   Cocaine 08/10/2023 POSITIVE (A)  NONE DETECTED Final   Benzodiazepines 08/10/2023 NONE DETECTED  NONE DETECTED Final   Amphetamines 08/10/2023 POSITIVE (A)  NONE DETECTED Final   Tetrahydrocannabinol 08/10/2023 POSITIVE (A)  NONE DETECTED Final   Barbiturates 08/10/2023 NONE DETECTED  NONE DETECTED Final   Comment: (NOTE) DRUG SCREEN FOR MEDICAL PURPOSES ONLY.  IF CONFIRMATION IS  NEEDED FOR ANY PURPOSE, NOTIFY LAB WITHIN 5 DAYS.  LOWEST DETECTABLE LIMITS FOR URINE DRUG SCREEN Drug Class                     Cutoff (ng/mL) Amphetamine and metabolites    1000 Barbiturate and metabolites    200 Benzodiazepine                 200 Opiates and metabolites        300 Cocaine and metabolites        300 THC                            50 Performed at [facility], [address], [place], [place] [address]    Total CK 08/10/2023 461 (H)  49 - 397 U/L Final   Performed at [facility], [address], [place], [place] [address]   Hepatitis B Surface Ag 08/11/2023 NON REACTIVE  NON REACTIVE Final   HCV Ab 08/11/2023 Reactive (A)  NON REACTIVE Final   Comment: (NOTE) The CDC recommends that a Reactive HCV antibody result be followed up  with a HCV Nucleic Acid Amplification test.     Hep A IgM 08/11/2023 NON REACTIVE  NON REACTIVE Final   Hep B C IgM 08/11/2023 NON REACTIVE  NON REACTIVE Final   Performed at [facility], [address] [place], [place] [address]   Total Protein 08/12/2023 7.4  6.5 - 8.1 g/dL Final   Albumin 62/95/2841 3.2 (L)  3.5 - 5.0 g/dL Final   AST 32/44/0102 381 (H)  15 - 41 U/L Final   ALT 08/12/2023 247 (H)  0 - 44 U/L Final   Alkaline Phosphatase 08/12/2023 97  38 - 126 U/L Final   Total Bilirubin 08/12/2023 1.0  <1.2 mg/dL Final   Bilirubin, Direct 08/12/2023 0.2  0.0 - 0.2 mg/dL Final   Indirect Bilirubin 08/12/2023 0.8  0.3 - 0.9 mg/dL Final   Performed at [facility], [address] [place], [place] [address]   Total CK 08/12/2023 41 (L)  49 - 397 U/L Final   Performed at [facility], [address] [place], [place] [address]   Ammonia 08/12/2023 48 (H)  9 - 35 umol/L Final   Performed  at [facility], [address], [place], [place] [address]  Admission on 08/04/2023, Discharged on 08/07/2023  Component Date Value Ref Range  Status   Amphetamines, Urine 08/04/2023 Negative  Cutoff=1000 ng/mL Final   Amphetamine test includes Amphetamine and Methamphetamine.   Barbiturate, Ur 08/04/2023 Negative  Cutoff=300 ng/mL Final   Benzodiazepine Quant, Ur 08/04/2023 Negative  Cutoff=300 ng/mL Final   Cannabinoid Quant, Ur 08/04/2023 Negative  Cutoff=50 ng/mL Final   Cocaine (Metab.) 08/04/2023 See Final Results  Cutoff=300 ng/mL Final   Opiate Quant, Ur 08/04/2023 Negative  Cutoff=300 ng/mL Final   Opiate test includes Codeine and Morphine only.   Phencyclidine, Ur 08/04/2023 Negative  Cutoff=25 ng/mL Final   Methadone Screen, Urine 08/04/2023 Negative  Cutoff=300 ng/mL Final   Propoxyphene, Urine 08/04/2023 Negative  Cutoff=300 ng/mL Final   Ethanol U, Cloretta Ned 08/04/2023 See Final Results  Cutoff=0.020 % Final   Comment: (NOTE) Performed At: [facility] [address] [place], [place] [id number] Avis Epley PhD [id number]    WBC 08/04/2023 5.8  4.0 - 10.5 K/uL Final   RBC 08/04/2023 3.93 (L)  4.22 - 5.81 MIL/uL Final   Hemoglobin 08/04/2023 12.9 (L)  13.0 - 17.0 g/dL Final   HCT 66/03/3015 36.4 (L)  39.0 - 52.0 % Final   MCV 08/04/2023 92.6  80.0 - 100.0 fL Final   MCH 08/04/2023 32.8  26.0 - 34.0 pg Final   MCHC 08/04/2023 35.4  30.0 - 36.0 g/dL Final   RDW 10/11/3233 12.7  11.5 - 15.5 % Final   Platelets 08/04/2023 147 (L)  150 - 400 K/uL Final   Comment: SPECIMEN CHECKED FOR CLOTS REPEATED TO VERIFY PLATELET COUNT CONFIRMED BY SMEAR    nRBC 08/04/2023 0.0  0.0 - 0.2 % Final   Neutrophils Relative % 08/04/2023 76  % Final   Neutro Abs 08/04/2023 4.4  1.7 - 7.7 K/uL Final   Lymphocytes Relative 08/04/2023 16  % Final   Lymphs Abs 08/04/2023 0.9  0.7 - 4.0 K/uL Final   Monocytes Relative 08/04/2023 6  % Final   Monocytes Absolute 08/04/2023 0.3  0.1 - 1.0 K/uL Final   Eosinophils Relative 08/04/2023 0  % Final   Eosinophils Absolute 08/04/2023 0.0  0.0 - 0.5 K/uL Final   Basophils Relative 08/04/2023 1   % Final   Basophils Absolute 08/04/2023 0.0  0.0 - 0.1 K/uL Final   Immature Granulocytes 08/04/2023 1  % Final   Abs Immature Granulocytes 08/04/2023 0.04  0.00 - 0.07 K/uL Final   Performed at [facility], [address], [place], [place] [address]   Sodium 08/04/2023 134 (L)  135 - 145 mmol/L Final   Potassium 08/04/2023 3.0 (L)  3.5 - 5.1 mmol/L Final   Chloride 08/04/2023 95 (L)  98 - 111 mmol/L Final   CO2 08/04/2023 24  22 - 32 mmol/L Final   Glucose, Bld 08/04/2023 115 (H)  70 - 99 mg/dL Final   Glucose reference range applies only to samples taken after fasting for at least 8 hours.   BUN 08/04/2023 5 (L)  6 - 20 mg/dL Final   Creatinine, Ser 08/04/2023 0.56 (L)  0.61 - 1.24 mg/dL Final   Calcium 02/54/2706 8.2 (L)  8.9 - 10.3 mg/dL Final   Total Protein 23/76/2831 7.7  6.5 - 8.1 g/dL Final   Albumin 51/76/1607 3.6  3.5 - 5.0 g/dL Final   AST 37/07/6268 205 (H)  15 - 41 U/L Final  ALT 08/04/2023 99 (H)  0 - 44 U/L Final   Alkaline Phosphatase 08/04/2023 98  38 - 126 U/L Final   Total Bilirubin 08/04/2023 1.0  0.3 - 1.2 mg/dL Final   GFR, Estimated 08/04/2023 >60  >60 mL/min Final   Comment: (NOTE) Calculated using the CKD-EPI Creatinine Equation (2021)    Anion gap 08/04/2023 15  5 - 15 Final   Performed at [facility], [address] [place], [place] [address]   Hgb A1c MFr Bld 08/04/2023 5.4  4.8 - 5.6 % Final   Comment: (NOTE) Pre diabetes:          5.7%-6.4%  Diabetes:              >6.4%  Glycemic control for   <7.0% adults with diabetes    Mean Plasma Glucose 08/04/2023 108.28  mg/dL Final   Performed at [facility], [address], [place], [place] [address]   TSH 08/04/2023 0.613  0.350 - 4.500 uIU/mL Final   Comment: Performed by a 3rd Generation assay with a functional sensitivity of <=0.01 uIU/mL. Performed at [facility], [address], [place], [place] [address]    Neisseria Gonorrhea 08/04/2023 Negative   Final    Chlamydia 08/04/2023 Negative   Final   Comment 08/04/2023 Normal Reference [place] Chlamydia - Negative   Final   Comment 08/04/2023 Normal Reference Range Neisseria Gonorrhea - Negative   Final   RPR Ser Ql 08/04/2023 NON REACTIVE  NON REACTIVE Final   Performed at [facility], [address], [place], [place] [address]   HIV-1 P24 Antigen - HIV24 08/04/2023 NON REACTIVE  NON REACTIVE Final   Comment: (NOTE) Detection of p24 may be inhibited by biotin in the sample, causing false negative results in acute infection.    HIV 1/2 Antibodies 08/04/2023 NON REACTIVE  NON REACTIVE Final   Interpretation (HIV Ag Ab) 08/04/2023 A non reactive test result means that HIV 1 or HIV 2 antibodies and HIV 1 p24 antigen were not detected in the specimen.   Final   Performed at [facility], [address], [place], [place] [address]   Hepatitis B Surface Ag 08/04/2023 NON REACTIVE  NON REACTIVE Final   HCV Ab 08/04/2023 Reactive (A)  NON REACTIVE Final   Comment: (NOTE) The CDC recommends that a Reactive HCV antibody result be followed up  with a HCV Nucleic Acid Amplification test.     Hep A IgM 08/04/2023 NON REACTIVE  NON REACTIVE Final   Hep B C IgM 08/04/2023 NON REACTIVE  NON REACTIVE Final   Performed at [facility], [address] [place], [place] [address]   POC Amphetamine UR 08/04/2023 None Detected  NONE DETECTED (Cut Off Level 1000 ng/mL) Final   POC Secobarbital (BAR) 08/04/2023 None Detected  NONE DETECTED (Cut Off Level 300 ng/mL) Final   POC Buprenorphine (BUP) 08/04/2023 None Detected  NONE DETECTED (Cut Off Level 10 ng/mL) Final   POC Oxazepam (BZO) 08/04/2023 None Detected  NONE DETECTED (Cut Off Level 300 ng/mL) Final   POC Cocaine UR 08/04/2023 Positive (A)  NONE DETECTED (Cut Off Level 300 ng/mL) Final   POC Methamphetamine UR 08/04/2023 None Detected  NONE DETECTED (Cut Off Level 1000 ng/mL) Final   POC Morphine 08/04/2023 None Detected  NONE DETECTED (Cut  Off Level 300 ng/mL) Final   POC Methadone UR 08/04/2023 None Detected  NONE DETECTED (Cut Off Level 300 ng/mL)  Final   POC Oxycodone UR 08/04/2023 None Detected  NONE DETECTED (Cut Off Level 100 ng/mL) Final   POC Marijuana UR 08/04/2023 Positive (A)  NONE DETECTED (Cut Off Level 50 ng/mL) Final   Cocaine Metab Quant, Ur 08/04/2023 Positive (A)  Cutoff=300 Final   Benzoylecgonine GC/MS Conf 08/04/2023 35,080  Cutoff=150 ng/mL Final   Comment: (NOTE) Performed At: [facility] [address] [place], [place] [id number] Avis Epley PhD [id number]    Ethanol, Urine 08/04/2023 FID) (A)  Cutoff=0.020 Final   Comment: Positive Confirmation performed by Gas Chromatography (GC    Ethanol, Ur - Confirmation 08/04/2023 0.099  Cutoff=0.020 % Final   Comment: (NOTE) GLUCOSE NEGATIVE Performed At: [facility] [address] [place], [place] [id number] Avis Epley PhD [id number]   Admission on 06/15/2023, Discharged on 06/16/2023  Component Date Value Ref Range Status   WBC 06/15/2023 5.8  4.0 - 10.5 K/uL Final   RBC 06/15/2023 4.61  4.22 - 5.81 MIL/uL Final   Hemoglobin 06/15/2023 15.5  13.0 - 17.0 g/dL Final   HCT 47/42/5956 46.0  39.0 - 52.0 % Final   MCV 06/15/2023 99.8  80.0 - 100.0 fL Final   MCH 06/15/2023 33.6  26.0 - 34.0 pg Final   MCHC 06/15/2023 33.7  30.0 - 36.0 g/dL Final   RDW 38/75/6433 13.3  11.5 - 15.5 % Final   Platelets 06/15/2023 244  150 - 400 K/uL Final   nRBC 06/15/2023 0.0  0.0 - 0.2 % Final   Neutrophils Relative % 06/15/2023 56  % Final   Neutro Abs 06/15/2023 3.3  1.7 - 7.7 K/uL Final   Lymphocytes Relative 06/15/2023 33  % Final   Lymphs Abs 06/15/2023 1.9  0.7 - 4.0 K/uL Final   Monocytes Relative 06/15/2023 6  % Final   Monocytes Absolute 06/15/2023 0.4  0.1 - 1.0 K/uL Final   Eosinophils Relative 06/15/2023 2  % Final   Eosinophils Absolute 06/15/2023 0.1  0.0 - 0.5 K/uL Final   Basophils Relative 06/15/2023 3  % Final   Basophils Absolute  06/15/2023 0.2 (H)  0.0 - 0.1 K/uL Final   Immature Granulocytes 06/15/2023 0  % Final   Abs Immature Granulocytes 06/15/2023 0.01  0.00 - 0.07 K/uL Final   Performed at [facility], [address], [place], [place] [address]   Sodium 06/15/2023 140  135 - 145 mmol/L Final   Potassium 06/15/2023 3.8  3.5 - 5.1 mmol/L Final   Chloride 06/15/2023 103  98 - 111 mmol/L Final   CO2 06/15/2023 28  22 - 32 mmol/L Final   Glucose, Bld 06/15/2023 108 (H)  70 - 99 mg/dL Final   Glucose reference range applies only to samples taken after fasting for at least 8 hours.   BUN 06/15/2023 5 (L)  6 - 20 mg/dL Final   Creatinine, Ser 06/15/2023 0.65  0.61 - 1.24 mg/dL Final   Calcium 84/16/6063 8.3 (L)  8.9 - 10.3 mg/dL Final   Total Protein 01/60/1093 8.6 (H)  6.5 - 8.1 g/dL Final   Albumin 23/55/7322 3.6  3.5 - 5.0 g/dL Final   AST 02/54/2706 296 (H)  15 - 41 U/L Final   ALT 06/15/2023 205 (H)  0 - 44 U/L Final   Alkaline Phosphatase 06/15/2023 145 (H)  38 - 126 U/L Final   Total Bilirubin 06/15/2023 0.4  0.3 - 1.2 mg/dL Final   GFR, Estimated 06/15/2023 >60  >60 mL/min Final  Comment: (NOTE) Calculated using the CKD-EPI Creatinine Equation (2021)    Anion gap 06/15/2023 9  5 - 15 Final   Performed at [facility], [address] [place], [place] [address]   Lipase 06/15/2023 27  11 - 51 U/L Final   Performed at [facility], [address], [place], [place] [address]   Specimen Source 06/16/2023 URINE, CLEAN CATCH   Final   Color, Urine 06/16/2023 YELLOW  YELLOW Final   APPearance 06/16/2023 HAZY (A)  CLEAR Final   Specific Gravity, Urine 06/16/2023 1.015  1.005 - 1.030 Final   pH 06/16/2023 6.0  5.0 - 8.0 Final   Glucose, UA 06/16/2023 NEGATIVE  NEGATIVE mg/dL Final   Hgb urine dipstick 06/16/2023 NEGATIVE  NEGATIVE Final   Bilirubin Urine 06/16/2023 NEGATIVE  NEGATIVE Final   Ketones, ur 06/16/2023 NEGATIVE  NEGATIVE mg/dL Final    Protein, ur 52/84/1324 NEGATIVE  NEGATIVE mg/dL Final   Nitrite 40/07/2724 NEGATIVE  NEGATIVE Final   Leukocytes,Ua 06/16/2023 NEGATIVE  NEGATIVE Final   RBC / HPF 06/16/2023 0-5  0 - 5 RBC/hpf Final   WBC, UA 06/16/2023 0-5  0 - 5 WBC/hpf Final   Comment:        Reflex urine culture not performed if WBC <=10, OR if Squamous epithelial cells >5. If Squamous epithelial cells >5 suggest recollection.    Bacteria, UA 06/16/2023 NONE SEEN  NONE SEEN Final   Squamous Epithelial / HPF 06/16/2023 0-5  0 - 5 /HPF Final   Mucus 06/16/2023 PRESENT   Final   Performed at [facility], [address] [place], [place] [address]   Alcohol, Ethyl (B) 06/15/2023 288 (H)  <10 mg/dL Final   Comment: (NOTE) Lowest detectable limit for serum alcohol is 10 mg/dL.  For medical purposes only. Performed at [facility], [address] [place], [place] [address]    Salicylate Lvl 06/15/2023 <7.0 (L)  7.0 - 30.0 mg/dL Final   Performed at [facility], [address], [place], [place] [address]   Acetaminophen (Tylenol), Serum 06/15/2023 <10 (L)  10 - 30 ug/mL Final   Comment: (NOTE) Therapeutic concentrations vary significantly. A range of 10-30 ug/mL  may be an effective concentration for many patients. However, some  are best treated at concentrations outside of this range. Acetaminophen concentrations >150 ug/mL at 4 hours after ingestion  and >50 ug/mL at 12 hours after ingestion are often associated with  toxic reactions.  Performed at [facility], [address] [place], [place] [address]    Opiates 06/16/2023 NONE DETECTED  NONE DETECTED Final   Cocaine 06/16/2023 POSITIVE (A)  NONE DETECTED Final   Benzodiazepines 06/16/2023 NONE DETECTED  NONE DETECTED Final   Amphetamines 06/16/2023 NONE DETECTED  NONE DETECTED Final   Tetrahydrocannabinol 06/16/2023 POSITIVE (A)  NONE DETECTED Final   Barbiturates 06/16/2023  NONE DETECTED  NONE DETECTED Final   Comment: (NOTE) DRUG SCREEN FOR MEDICAL PURPOSES ONLY.  IF CONFIRMATION IS NEEDED FOR ANY PURPOSE, NOTIFY LAB WITHIN 5 DAYS.  LOWEST DETECTABLE LIMITS FOR URINE DRUG SCREEN Drug Class                     Cutoff (ng/mL) Amphetamine and metabolites    1000 Barbiturate and metabolites    200 Benzodiazepine                 200 Opiates and metabolites  300 Cocaine and metabolites        300 THC                            50 Performed at [facility], [address] [place], [place] [address]   There may be more visits with results that are not included.    Allergies: Codeine  Medications:  Facility Ordered Medications  Medication   acetaminophen (TYLENOL) tablet 650 mg   alum & mag hydroxide-simeth (MAALOX/MYLANTA) 200-200-20 MG/5ML suspension 30 mL   magnesium hydroxide (MILK OF MAGNESIA) suspension 30 mL   thiamine (VITAMIN B1) tablet 100 mg   multivitamin with minerals tablet 1 tablet   LORazepam (ATIVAN) tablet 1 mg   hydrOXYzine (ATARAX) tablet 25 mg   loperamide (IMODIUM) capsule 2-4 mg   ondansetron (ZOFRAN-ODT) disintegrating tablet 4 mg   haloperidol (HALDOL) tablet 5 mg   And   diphenhydrAMINE (BENADRYL) capsule 50 mg   haloperidol lactate (HALDOL) injection 5 mg   And   diphenhydrAMINE (BENADRYL) injection 50 mg   And   LORazepam (ATIVAN) injection 2 mg   haloperidol lactate (HALDOL) injection 10 mg   And   diphenhydrAMINE (BENADRYL) injection 50 mg   And   LORazepam (ATIVAN) injection 2 mg   traZODone (DESYREL) tablet 50 mg   gabapentin (NEURONTIN) capsule 400 mg   nicotine polacrilex (NICORETTE) gum 2 mg   dicyclomine (BENTYL) tablet 20 mg   methocarbamol (ROBAXIN) tablet 500 mg   naproxen (NAPROSYN) tablet 500 mg   [COMPLETED] thiamine (VITAMIN B1) injection 100 mg   PTA Medications  Medication Sig   DULoxetine (CYMBALTA) 30 MG capsule Take 1 capsule (30 mg total) by mouth daily.  (Patient not taking: Reported on 12/07/2023)   gabapentin (NEURONTIN) 300 MG capsule Take 2 capsules (600 mg total) by mouth 3 (three) times daily.   hydrOXYzine (ATARAX) 25 MG tablet Take 1 tablet (25 mg total) by mouth every 6 (six) hours as needed for anxiety.   nicotine (NICODERM CQ - DOSED IN MG/24 HR) 7 mg/24hr patch Place 1 patch (7 mg total) onto the skin daily.   traZODone (DESYREL) 50 MG tablet Take 1 tablet (50 mg total) by mouth at bedtime as needed for sleep.   acetaminophen (TYLENOL) 325 MG tablet Take 2 tablets (650 mg total) by mouth every 6 (six) hours as needed for mild pain (pain score 1-3).   naproxen sodium (ALEVE) 220 MG tablet Take 220 mg by mouth.   predniSONE (DELTASONE) 20 MG tablet Take 2 tablets (40 mg total) by mouth daily with breakfast for 5 days.    Long Term Goals: Improvement in symptoms so as ready for discharge  Short Term Goals: Patient will verbalize feelings in meetings with treatment team members., Patient will attend at least of 50% of the groups daily., and Pt will complete the PHQ9 on admission, day 3 and discharge.  Medical Decision Making  Pt is appropriate for treatment at [facility] for [facility].  UDS + cocaine, benzodiazepines and THC.    Polysubstance abuse -COWS with clonidine taper -CIWA with Ativan -Continue gabapentin 400 mg 3 times daily  Normocytic anemia -Could be iron deficiency anemia or hemodilution  Lab and imaging reviewed and include:      SARS Coronavirus 2 by RT PCR (hospital order, performed in [facility] hospital lab) *cepheid single result test* Anterior Nasal Swab  Ethanol         CBC with Differential         Comprehensive metabolic panel         Lipid panel         Magnesium         TSH         Hemoglobin A1c         POCT Urine Drug Screen - (I-Screen)      Recommendations  Based on my evaluation the patient does not appear to have an emergency medical condition.  Park Pope, MD 12/09/23   2:16 PM

## 2023-12-09 NOTE — ED Notes (Signed)
 PRN tylenol, methocarbamol, and naproxen given due to patient reports of pain and generalized discomfort. Medication administered with no complications. Environment secured, safety checks in place per facility policy.

## 2023-12-09 NOTE — ED Notes (Signed)
 Pt was provided breakfast.

## 2023-12-09 NOTE — ED Notes (Signed)
 Patient alert & oriented x4. Denies intent to harm self or others when asked. Denies A/VH. Patient reports pain rating 8/10, PRN Tylenol and Robaxin administered, pain reassessed 2/10. Scheduled and PRN medications administered with no complications. No acute distress noted. Support and encouragement provided. Routine safety checks conducted per facility protocol. Encouraged patient to notify staff if any thoughts of harm towards self or others arise. Patient verbalizes understanding and agreement.

## 2023-12-10 DIAGNOSIS — R262 Difficulty in walking, not elsewhere classified: Secondary | ICD-10-CM | POA: Diagnosis not present

## 2023-12-10 DIAGNOSIS — F1510 Other stimulant abuse, uncomplicated: Secondary | ICD-10-CM | POA: Diagnosis not present

## 2023-12-10 DIAGNOSIS — G8921 Chronic pain due to trauma: Secondary | ICD-10-CM | POA: Diagnosis not present

## 2023-12-10 DIAGNOSIS — F1929 Other psychoactive substance dependence with unspecified psychoactive substance-induced disorder: Secondary | ICD-10-CM | POA: Diagnosis not present

## 2023-12-10 NOTE — ED Notes (Signed)
 Pt walking down male hallway at this time w/ walker. Pt in NAD, calm and cooperative. Will continue to monitor.

## 2023-12-10 NOTE — ED Notes (Signed)
Pt sleeping at this time   Rise and fall of chest noted

## 2023-12-10 NOTE — Group Note (Signed)
 Group Topic: Recovery Basics  Group Date: 12/10/2023 Start Time: 2000 End Time: 2030 Facilitators: Rae Lips B  Department: [facility]  Number of Participants: 3  Group Focus: abuse issues Treatment Modality:  Individual Therapy and Leisure Development Interventions utilized were patient education and support Purpose: enhance coping skills, express feelings, increase insight, and trigger / craving management  Name: Mark Hammond Date of Birth: 1991/05/18  MR: [id number]    Level of Participation: patient didn't attend Quality of Participation: cooperative Interactions with others: n/a Mood/Affect: appropriate Triggers (if applicable): n/a Cognition: coherent/clear Progress: None Response: n/a Plan: patient will be encouraged to come to group  Patients Problems:  Patient Active Problem List   Diagnosis Date Noted   Polysubstance abuse (HCC) 11/21/2023   Alcohol use disorder 08/04/2023   Stimulant use disorder 08/04/2023   Opioid use disorder 08/04/2023   Tobacco use disorder 08/04/2023   Cannabis use disorder 08/04/2023   Hepatitis C 03/01/2023   Anxiety state 01/14/2023   Insomnia 01/14/2023   Schizoaffective disorder (HCC) 01/13/2023   Schizophrenia (HCC) 11/15/2019   Polysubstance (including opioids) dependence, daily use (HCC)    Substance induced mood disorder (HCC) 10/24/2019   Methamphetamine use disorder, 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

## 2023-12-10 NOTE — Group Note (Signed)
 Group Topic: Relaxation  Group Date: 12/10/2023 Start Time: 1625 End Time: 1655 Facilitators: Evelina Bucy, RN  Department: [facility]  Number of Participants: 5  Group Focus: check in, communication, relaxation, and social skills Treatment Modality:  Interpersonal Therapy Interventions utilized were group exercise and story telling Purpose: improve communication skills  Name: Mark Hammond Date of Birth: [birth year]  MR: [id number]    Level of Participation: active Quality of Participation: attentive, cooperative, and engaged Interactions with others: gave feedback Mood/Affect: appropriate Triggers (if applicable):  Cognition: coherent/clear Progress: Gaining insight Response:  Plan: follow-up needed  Patients Problems:  Patient Active Problem List   Diagnosis Date Noted   Polysubstance abuse (HCC) 11/21/2023   Alcohol use disorder 08/04/2023   Stimulant use disorder 08/04/2023   Opioid use disorder 08/04/2023   Tobacco use disorder 08/04/2023   Cannabis use disorder 08/04/2023   Hepatitis C 03/01/2023   Anxiety state 01/14/2023   Insomnia 01/14/2023   Schizoaffective disorder (HCC) 01/13/2023   Schizophrenia (HCC) 11/15/2019   Polysubstance (including opioids) dependence, daily use (HCC)    Substance induced mood disorder (HCC) 10/24/2019   Methamphetamine use disorder, 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

## 2023-12-10 NOTE — ED Notes (Signed)
 Pt is in his room resting in bed. Pt denies SI/HI/AVH. No acute distress noted. Will continue to monitor for safety.

## 2023-12-10 NOTE — ED Notes (Signed)
 Patient given bentyl and naproxen for pain in left leg/hip

## 2023-12-10 NOTE — ED Notes (Signed)
 Patient is resting in bed with eyes closed, even unlabored breathing. No s/s of discomfort. Patient will be continued to be monitored for safety per protocol and for changes in condition.

## 2023-12-10 NOTE — ED Notes (Signed)
 Pt in bedroom sleeping at this time.

## 2023-12-10 NOTE — ED Provider Notes (Signed)
 Facility Based Crisis Progress Note  Date: 12/10/23 Patient Name: Mark Hammond MRN: [id number] Chief Complaint: Depression, substance use  Diagnoses:  Final diagnoses:  Polysubstance (including opioids) dependence, daily use (HCC)  Stimulant use disorder  Tobacco use disorder  Substance induced mood disorder (HCC)  Severe episode of recurrent major depressive disorder, without psychotic features (HCC)  Chronic pain due to trauma  Ambulatory dysfunction  Alcohol use disorder   Mark Hammond is a 33 yo male with a history of polysubstance use disorder (alcohol, tobacco cocaine, [employer], and cannabis), schizoaffective disorder, self-reported bipolar disorder who initially presented to the [facility] on 12/06/2023 voluntarily for complaints of suicidal ideations and requesting detox from ongoing alcohol, cocaine, and cannabis use.  Patient expressed interest in transitioning to a residential rehabilitation program and was admitted to [facility] on 12/08/2023 to complete detox.  . Medical hx of chronic left leg pain for which he was prescribed Gabapentin and Naproxen. He has been medically assessed multiple times for this leg pain.  Patient was discharged from the [facility] back in February 2025 and reports relapsing on substances shortly after discharge.  Interval history Patient seen and assessed at bedside.  Denies SI/HI/AVH.  Reports eating and sleeping fairly well.  Reports minimal withdrawal symptoms of mild tremor but otherwise no acute complaints at this time.  Reports eating and sleeping well.  Reports that gabapentin seems to help with his leg pain which now has migrated lower left leg to his left mid thigh per patient.  He is uncertain what is causing this leg pain but it can sometimes be pulsing or constant in nature.   Psychiatric ROS Mood Symptoms 60-month history of depressed mood, anhedonia, fatigue, diminished concentration, feelings of worthlessness/hopelessness/guilt, chronic SI without intent or  plan Manic Symptoms Reports prior diagnosis of bipolar 1 disorder, with prior manic episodes, although patient is unable to distinguish true periods of expansive energy or mood outside of substance use since he has never had a significant period of sobriety. Anxiety Symptoms Not formally assessed Trauma Symptoms Not formally assessed Psychosis Symptoms Denied AVH, paranoia, delusional thought processes  Substance Use Hx: Alcohol: started at age 65. 1/5th and 4 40 oz, 2 16 oz, and 2 cartons a  wine daily.  1 week of sobriety at [facility] last time.  Dneies seizures or DTs Tobacco: 3 cigarretes daily, started ta age 16 Cannabis:1 bowl of weed a day, smokes it daily - slows his usage of other, appetite Cocaine: smokes, $40 daily Methamphetamines: denies Opiates (fentanyl / heroin): oxycodone and percocets, depending on what is available to him . Reports using approximately 4 pills daily.  IVDU: denies  Past Psychiatric Hx: No current outpatient psychiatric follow-up Patient reports being previously diagnosed with bipolar disorder and depression. No current psychotropic medications Unable to recall  Past Medical History: PCP:No Medical Dx: Chronic unspecified left leg injury Allergies: codeine Trauma:Denies Seizures:Denies  Family Medical History: Unknown to pt  Family Psychiatric History: Unknown to pt  Social History: Patient lives in [place] with girlfriend Social Support: girlfriend Occupational YQ:[id number] Children:denies   PHQ 2-9:  Flowsheet Row ED from 12/08/2023 in [facility] ED from 11/21/2023 in [name]  Thoughts that you would be better off dead, or of hurting yourself in some way More than half the days Not at all  PHQ-9 Total Score 20 8       Flowsheet Row ED from 12/08/2023 in [facility] ED from 12/06/2023 in [facility] ED from 12/04/2023 in [facility] Emergency Department at [facility]  C-SSRS RISK CATEGORY No Risk No Risk High Risk       Screenings    Flowsheet Row Most Recent Value  CIWA-Ar Total 0  COWS Total Score 7       Total Time spent with patient: 1.5 hours  Psychiatric Specialty Exam  Presentation General Appearance:  Disheveled (Malodorous)  Eye Contact: Good  Speech: Clear and Coherent; Normal Rate  Speech Volume: Decreased  Handedness: -- (not assessed)   Mood and Affect  Mood: Depressed; Irritable  Affect: Congruent   Thought Process  Thought Processes: Linear  Descriptions of Associations:Intact  Orientation:None  Thought Content:Logical  Diagnosis of Schizophrenia or Schizoaffective disorder in past: Yes   Hallucinations:No data recorded  Ideas of Reference:None  Suicidal Thoughts:No data recorded  Homicidal Thoughts:No data recorded   Sensorium  Memory: Immediate Fair; Recent Fair; Remote Fair  Judgment: Poor  Insight: Poor   Executive Functions  Concentration: Fair  Attention Span: Fair  Recall: [employer] of Knowledge: Fair  Language: Fair   Psychomotor Activity  Psychomotor Activity: No data recorded   Assets  Assets: Desire for Improvement; Resilience; Communication Skills   Sleep  Sleep: No data recorded   No data recorded   Physical Exam Vitals and nursing note reviewed.  Constitutional:      General: He is not in acute distress.    Appearance: He is not ill-appearing.  HENT:     Head: Normocephalic and atraumatic.  Eyes:     Extraocular Movements: Extraocular movements intact.     Conjunctiva/sclera: Conjunctivae normal.  Pulmonary:     Effort: Pulmonary effort is normal. No respiratory distress.  Skin:    General: Skin is warm and dry.  Neurological:     General: No focal deficit present.    Review of Systems  All other systems reviewed and are negative.   Blood pressure 94/61, pulse 69,  temperature 97.7 F (36.5 C), temperature source Oral, resp. rate 18, SpO2 99%. There is no height or weight on file to calculate BMI.    Last Labs:  Admission on 12/08/2023  Component Date Value Ref Range Status   Opiates 12/08/2023 NONE DETECTED  NONE DETECTED Final   Cocaine 12/08/2023 NONE DETECTED  NONE DETECTED Final   Benzodiazepines 12/08/2023 POSITIVE (A)  NONE DETECTED Final   Amphetamines 12/08/2023 NONE DETECTED  NONE DETECTED Final   Tetrahydrocannabinol 12/08/2023 POSITIVE (A)  NONE DETECTED Final   Barbiturates 12/08/2023 NONE DETECTED  NONE DETECTED Final   Comment: (NOTE) DRUG SCREEN FOR MEDICAL PURPOSES ONLY.  IF CONFIRMATION IS NEEDED FOR ANY PURPOSE, NOTIFY LAB WITHIN 5 DAYS.  LOWEST DETECTABLE LIMITS FOR URINE DRUG SCREEN Drug Class                     Cutoff (ng/mL) Amphetamine and metabolites    1000 Barbiturate and metabolites    200 Benzodiazepine                 200 Opiates and metabolites        300 Cocaine and metabolites        300 THC                            50 Performed at [facility] Lab, [address], [place], [place] [address]   Admission on 12/06/2023, Discharged on  12/08/2023  Component Date Value Ref Range Status   Alcohol, Ethyl (B) 12/06/2023 <10  <10 mg/dL Final   Comment: (NOTE) Lowest detectable limit for serum alcohol is 10 mg/dL.  For medical purposes only. Performed at [facility] Lab, [address], [place], [place] [address]    POC Amphetamine UR 12/06/2023 None Detected  NONE DETECTED (Cut Off Level 1000 ng/mL) Final   POC Secobarbital (BAR) 12/06/2023 None Detected  NONE DETECTED (Cut Off Level 300 ng/mL) Final   POC Buprenorphine (BUP) 12/06/2023 None Detected  NONE DETECTED (Cut Off Level 10 ng/mL) Final   POC Oxazepam (BZO) 12/06/2023 Positive (A)  NONE DETECTED (Cut Off Level 300 ng/mL) Final   POC Cocaine UR 12/06/2023 Positive (A)  NONE DETECTED (Cut Off Level 300 ng/mL) Final   POC Methamphetamine UR  12/06/2023 None Detected  NONE DETECTED (Cut Off Level 1000 ng/mL) Final   POC Morphine 12/06/2023 None Detected  NONE DETECTED (Cut Off Level 300 ng/mL) Final   POC Methadone UR 12/06/2023 None Detected  NONE DETECTED (Cut Off Level 300 ng/mL) Final   POC Oxycodone UR 12/06/2023 None Detected  NONE DETECTED (Cut Off Level 100 ng/mL) Final   POC Marijuana UR 12/06/2023 Positive (A)  NONE DETECTED (Cut Off Level 50 ng/mL) Final   WBC 12/08/2023 4.4  4.0 - 10.5 K/uL Final   RBC 12/08/2023 3.53 (L)  4.22 - 5.81 MIL/uL Final   Hemoglobin 12/08/2023 11.2 (L)  13.0 - 17.0 g/dL Final   HCT 60/45/4098 32.6 (L)  39.0 - 52.0 % Final   MCV 12/08/2023 92.4  80.0 - 100.0 fL Final   MCH 12/08/2023 31.7  26.0 - 34.0 pg Final   MCHC 12/08/2023 34.4  30.0 - 36.0 g/dL Final   RDW 11/91/4782 13.9  11.5 - 15.5 % Final   Platelets 12/08/2023 280  150 - 400 K/uL Final   nRBC 12/08/2023 0.0  0.0 - 0.2 % Final   Neutrophils Relative % 12/08/2023 59  % Final   Neutro Abs 12/08/2023 2.6  1.7 - 7.7 K/uL Final   Lymphocytes Relative 12/08/2023 27  % Final   Lymphs Abs 12/08/2023 1.2  0.7 - 4.0 K/uL Final   Monocytes Relative 12/08/2023 9  % Final   Monocytes Absolute 12/08/2023 0.4  0.1 - 1.0 K/uL Final   Eosinophils Relative 12/08/2023 3  % Final   Eosinophils Absolute 12/08/2023 0.1  0.0 - 0.5 K/uL Final   Basophils Relative 12/08/2023 1  % Final   Basophils Absolute 12/08/2023 0.1  0.0 - 0.1 K/uL Final   Immature Granulocytes 12/08/2023 1  % Final   Abs Immature Granulocytes 12/08/2023 0.02  0.00 - 0.07 K/uL Final   Performed at [facility] Lab, [address], [place], [place] [address]   Sodium 12/08/2023 139  135 - 145 mmol/L Final   Potassium 12/08/2023 4.0  3.5 - 5.1 mmol/L Final   Chloride 12/08/2023 100  98 - 111 mmol/L Final   CO2 12/08/2023 27  22 - 32 mmol/L Final   Glucose, Bld 12/08/2023 114 (H)  70 - 99 mg/dL Final   Glucose reference range applies only to samples taken after fasting for at  least 8 hours.   BUN 12/08/2023 9  6 - 20 mg/dL Final   Creatinine, Ser 12/08/2023 0.60 (L)  0.61 - 1.24 mg/dL Final   Calcium 30/86/5784 8.7 (L)  8.9 - 10.3 mg/dL Final   Total Protein 69/62/9528 6.2 (L)  6.5 - 8.1 g/dL  Final   Albumin 12/08/2023 2.3 (L)  3.5 - 5.0 g/dL Final   AST 82/95/6213 101 (H)  15 - 41 U/L Final   ALT 12/08/2023 55 (H)  0 - 44 U/L Final   Alkaline Phosphatase 12/08/2023 125  38 - 126 U/L Final   Total Bilirubin 12/08/2023 0.4  0.0 - 1.2 mg/dL Final   GFR, Estimated 12/08/2023 >60  >60 mL/min Final   Comment: (NOTE) Calculated using the CKD-EPI Creatinine Equation (2021)    Anion gap 12/08/2023 12  5 - 15 Final   Performed at [facility] Lab, [address], [place], [place] [address]   Cholesterol 12/08/2023 96  0 - 200 mg/dL Final   Triglycerides 84/69/6295 55  <150 mg/dL Final   HDL 28/41/3244 34 (L)  >40 mg/dL Final   Total CHOL/HDL Ratio 12/08/2023 2.8  RATIO Final   VLDL 12/08/2023 11  0 - 40 mg/dL Final   LDL Cholesterol 12/08/2023 51  0 - 99 mg/dL Final   Comment:        Total Cholesterol/HDL:CHD Risk Coronary Heart Disease Risk Table                     Men   Women  1/2 Average Risk   3.4   3.3  Average Risk       5.0   4.4  2 X Average Risk   9.6   7.1  3 X Average Risk  23.4   11.0        Use the calculated Patient Ratio above and the CHD Risk Table to determine the patient's CHD Risk.        ATP III CLASSIFICATION (LDL):  <100     mg/dL   Optimal  010-272  mg/dL   Near or Above                    Optimal  130-159  mg/dL   Borderline  536-644  mg/dL   High  >034     mg/dL   Very High Performed at [facility] Lab, [address] [place], [place] [address]    Magnesium 12/08/2023 1.7  1.7 - 2.4 mg/dL Final   Performed at [facility] Lab, [address], [place], [place] [address]   TSH 12/08/2023 0.714  0.350 - 4.500 uIU/mL Final   Comment: Performed by a 3rd Generation assay with a functional sensitivity of <=0.01  uIU/mL. Performed at [facility] Lab, [address] [place], [place] [address]    Hgb A1c MFr Bld 12/08/2023 6.0 (H)  4.8 - 5.6 % Final   Comment: (NOTE) Pre diabetes:          5.7%-6.4%  Diabetes:              >6.4%  Glycemic control for   <7.0% adults with diabetes    Mean Plasma Glucose 12/08/2023 125.5  mg/dL Final   Performed at [facility] Lab, [address], [place], [place] [address]  Admission on 12/04/2023, Discharged on 12/04/2023  Component Date Value Ref Range Status   Color, Urine 12/04/2023 YELLOW  YELLOW Final   APPearance 12/04/2023 CLEAR  CLEAR Final   Specific Gravity, Urine 12/04/2023 1.029  1.005 - 1.030 Final   pH 12/04/2023 5.0  5.0 - 8.0 Final   Glucose, UA 12/04/2023 NEGATIVE  NEGATIVE mg/dL Final   Hgb urine dipstick 12/04/2023 NEGATIVE  NEGATIVE Final   Bilirubin Urine 12/04/2023  NEGATIVE  NEGATIVE Final   Ketones, ur 12/04/2023 NEGATIVE  NEGATIVE mg/dL Final   Protein, ur 30/86/5784 NEGATIVE  NEGATIVE mg/dL Final   Nitrite 69/62/9528 NEGATIVE  NEGATIVE Final   Leukocytes,Ua 12/04/2023 NEGATIVE  NEGATIVE Final   Performed at [facility], [address] [place], [place] [address]  Admission on 11/27/2023, Discharged on 11/28/2023  Component Date Value Ref Range Status   WBC 11/27/2023 13.5 (H)  4.0 - 10.5 K/uL Final   RBC 11/27/2023 4.12 (L)  4.22 - 5.81 MIL/uL Final   Hemoglobin 11/27/2023 12.8 (L)  13.0 - 17.0 g/dL Final   HCT 40/07/2724 38.4 (L)  39.0 - 52.0 % Final   MCV 11/27/2023 93.2  80.0 - 100.0 fL Final   MCH 11/27/2023 31.1  26.0 - 34.0 pg Final   MCHC 11/27/2023 33.3  30.0 - 36.0 g/dL Final   RDW 36/64/4034 13.4  11.5 - 15.5 % Final   Platelets 11/27/2023 375  150 - 400 K/uL Final   nRBC 11/27/2023 0.0  0.0 - 0.2 % Final   Performed at [facility] Lab, [address] [place], [place] [address]   Sodium 11/27/2023 132 (L)  135 - 145 mmol/L Final   Potassium 11/27/2023 4.3  3.5 - 5.1 mmol/L Final    Chloride 11/27/2023 95 (L)  98 - 111 mmol/L Final   CO2 11/27/2023 21 (L)  22 - 32 mmol/L Final   Glucose, Bld 11/27/2023 113 (H)  70 - 99 mg/dL Final   Glucose reference range applies only to samples taken after fasting for at least 8 hours.   BUN 11/27/2023 15  6 - 20 mg/dL Final   Creatinine, Ser 11/27/2023 0.86  0.61 - 1.24 mg/dL Final   Calcium 56/38/7564 9.5  8.9 - 10.3 mg/dL Final   GFR, Estimated 11/27/2023 >60  >60 mL/min Final   Comment: (NOTE) Calculated using the CKD-EPI Creatinine Equation (2021)    Anion gap 11/27/2023 16 (H)  5 - 15 Final   Performed at [facility] Lab, [address], [place], [place] [address]   Salicylate Lvl 11/27/2023 <7.0 (L)  7.0 - 30.0 mg/dL Final   Performed at [facility], [address] [place], [place] [address]   Acetaminophen (Tylenol), Serum 11/27/2023 10  10 - 30 ug/mL Final   Comment: (NOTE) Therapeutic concentrations vary significantly. A range of 10-30 ug/mL  may be an effective concentration for many patients. However, some  are best treated at concentrations outside of this range. Acetaminophen concentrations >150 ug/mL at 4 hours after ingestion  and >50 ug/mL at 12 hours after ingestion are often associated with  toxic reactions.  Performed at [facility], [address], [place], [place] [address]    Alcohol, Ethyl (B) 11/27/2023 127 (H)  <10 mg/dL Final   Comment: (NOTE) Lowest detectable limit for serum alcohol is 10 mg/dL.  For medical purposes only. Performed at [facility], [address], [place], [place] [address]    Opiates 11/27/2023 POSITIVE (A)  NONE DETECTED Final   Cocaine 11/27/2023 POSITIVE (A)  NONE DETECTED Final   Benzodiazepines 11/27/2023 POSITIVE (A)  NONE DETECTED Final   Amphetamines 11/27/2023 NONE DETECTED  NONE DETECTED Final   Tetrahydrocannabinol 11/27/2023 POSITIVE (A)  NONE DETECTED Final   Barbiturates 11/27/2023 NONE DETECTED  NONE DETECTED Final   Comment:  (NOTE) DRUG SCREEN FOR MEDICAL PURPOSES ONLY.  IF CONFIRMATION IS NEEDED FOR ANY PURPOSE, NOTIFY LAB WITHIN  5 DAYS.  LOWEST DETECTABLE LIMITS FOR URINE DRUG SCREEN Drug Class                     Cutoff (ng/mL) Amphetamine and metabolites    1000 Barbiturate and metabolites    200 Benzodiazepine                 200 Opiates and metabolites        300 Cocaine and metabolites        300 THC                            50 Performed at [facility] Lab, [address] [place], [place] [address]    Color, Urine 11/27/2023 AMBER (A)  YELLOW Final   BIOCHEMICALS MAY BE AFFECTED BY COLOR   APPearance 11/27/2023 CLEAR  CLEAR Final   Specific Gravity, Urine 11/27/2023 1.020  1.005 - 1.030 Final   pH 11/27/2023 5.0  5.0 - 8.0 Final   Glucose, UA 11/27/2023 NEGATIVE  NEGATIVE mg/dL Final   Hgb urine dipstick 11/27/2023 NEGATIVE  NEGATIVE Final   Bilirubin Urine 11/27/2023 NEGATIVE  NEGATIVE Final   Ketones, ur 11/27/2023 NEGATIVE  NEGATIVE mg/dL Final   Protein, ur 65/78/4696 NEGATIVE  NEGATIVE mg/dL Final   Nitrite 29/52/8413 NEGATIVE  NEGATIVE Final   Leukocytes,Ua 11/27/2023 NEGATIVE  NEGATIVE Final   Performed at [facility], [address] [place], [place] [address]   Neisseria Gonorrhea 11/27/2023 Negative   Final   Chlamydia 11/27/2023 Negative   Final   Comment 11/27/2023 Normal Reference Ranger Chlamydia - Negative   Final   Comment 11/27/2023 Normal Reference Range Neisseria Gonorrhea - Negative   Final  Admission on 11/21/2023, Discharged on 11/27/2023  Component Date Value Ref Range Status   SARS Coronavirus 2 by RT PCR 11/25/2023 NEGATIVE  NEGATIVE Final   Performed at [facility], [address] [place], [place] [address]   WBC 11/21/2023 5.6  4.0 - 10.5 K/uL Final   RBC 11/21/2023 4.14 (L)  4.22 - 5.81 MIL/uL Final   Hemoglobin 11/21/2023 13.1  13.0 - 17.0 g/dL Final   HCT 36/64/4034 38.8 (L)  39.0 - 52.0 % Final   MCV 11/21/2023 93.7  80.0 - 100.0 fL  Final   MCH 11/21/2023 31.6  26.0 - 34.0 pg Final   MCHC 11/21/2023 33.8  30.0 - 36.0 g/dL Final   RDW 74/25/9563 13.2  11.5 - 15.5 % Final   Platelets 11/21/2023 298  150 - 400 K/uL Final   nRBC 11/21/2023 0.0  0.0 - 0.2 % Final   Neutrophils Relative % 11/21/2023 83  % Final   Neutro Abs 11/21/2023 4.6  1.7 - 7.7 K/uL Final   Lymphocytes Relative 11/21/2023 14  % Final   Lymphs Abs 11/21/2023 0.8  0.7 - 4.0 K/uL Final   Monocytes Relative 11/21/2023 3  % Final   Monocytes Absolute 11/21/2023 0.2  0.1 - 1.0 K/uL Final   Eosinophils Relative 11/21/2023 0  % Final   Eosinophils Absolute 11/21/2023 0.0  0.0 - 0.5 K/uL Final   Basophils Relative 11/21/2023 0  % Final   Basophils Absolute 11/21/2023 0.0  0.0 - 0.1 K/uL Final   Immature Granulocytes 11/21/2023 0  % Final   Abs Immature Granulocytes 11/21/2023 0.01  0.00 - 0.07 K/uL Final   Performed at [facility] Lab, [address] [place], [place]  [address]   Sodium 11/21/2023 135  135 - 145 mmol/L Final   Potassium 11/21/2023 4.2  3.5 - 5.1 mmol/L Final   Chloride 11/21/2023 99  98 - 111 mmol/L Final   CO2 11/21/2023 26  22 - 32 mmol/L Final   Glucose, Bld 11/21/2023 162 (H)  70 - 99 mg/dL Final   Glucose reference range applies only to samples taken after fasting for at least 8 hours.   BUN 11/21/2023 7  6 - 20 mg/dL Final   Creatinine, Ser 11/21/2023 0.63  0.61 - 1.24 mg/dL Final   Calcium 60/45/4098 8.9  8.9 - 10.3 mg/dL Final   Total Protein 11/91/4782 8.1  6.5 - 8.1 g/dL Final   Albumin 95/62/1308 2.8 (L)  3.5 - 5.0 g/dL Final   AST 65/78/4696 91 (H)  15 - 41 U/L Final   ALT 11/21/2023 55 (H)  0 - 44 U/L Final   Alkaline Phosphatase 11/21/2023 87  38 - 126 U/L Final   Total Bilirubin 11/21/2023 0.8  0.0 - 1.2 mg/dL Final   GFR, Estimated 11/21/2023 >60  >60 mL/min Final   Comment: (NOTE) Calculated using the CKD-EPI Creatinine Equation (2021)    Anion gap 11/21/2023 10  5 - 15 Final   Performed at [facility]  Lab, [address], [place], [place] [address]   Alcohol, Ethyl (B) 11/21/2023 <10  <10 mg/dL Final   Comment: (NOTE) Lowest detectable limit for serum alcohol is 10 mg/dL.  For medical purposes only. Performed at [facility] Lab, [address], [place], [place] [address]    TSH 11/21/2023 0.365  0.350 - 4.500 uIU/mL Final   Comment: Performed by a 3rd Generation assay with a functional sensitivity of <=0.01 uIU/mL. Performed at [facility] Lab, [address], [place], [place] [address]    POC Amphetamine UR 11/22/2023 None Detected  NONE DETECTED (Cut Off Level 1000 ng/mL) Final   POC Secobarbital (BAR) 11/22/2023 None Detected  NONE DETECTED (Cut Off Level 300 ng/mL) Final   POC Buprenorphine (BUP) 11/22/2023 None Detected  NONE DETECTED (Cut Off Level 10 ng/mL) Final   POC Oxazepam (BZO) 11/22/2023 Positive (A)  NONE DETECTED (Cut Off Level 300 ng/mL) Final   POC Cocaine UR 11/22/2023 Positive (A)  NONE DETECTED (Cut Off Level 300 ng/mL) Final   POC Methamphetamine UR 11/22/2023 None Detected  NONE DETECTED (Cut Off Level 1000 ng/mL) Final   POC Morphine 11/22/2023 None Detected  NONE DETECTED (Cut Off Level 300 ng/mL) Final   POC Methadone UR 11/22/2023 None Detected  NONE DETECTED (Cut Off Level 300 ng/mL) Final   POC Oxycodone UR 11/22/2023 None Detected  NONE DETECTED (Cut Off Level 100 ng/mL) Final   POC Marijuana UR 11/22/2023 Positive (A)  NONE DETECTED (Cut Off Level 50 ng/mL) Final   Hgb A1c MFr Bld 11/25/2023 5.9 (H)  4.8 - 5.6 % Final   Comment: (NOTE) Pre diabetes:          5.7%-6.4%  Diabetes:              >6.4%  Glycemic control for   <7.0% adults with diabetes    Mean Plasma Glucose 11/25/2023 122.63  mg/dL Final   Performed at [facility] Lab, [address], [place], [place] [address]   Sodium 11/25/2023 136  135 - 145 mmol/L Final   Potassium 11/25/2023 4.2  3.5 - 5.1 mmol/L Final   Chloride 11/25/2023 101  98 - 111 mmol/L Final  CO2 11/25/2023 27  22  - 32 mmol/L Final   Glucose, Bld 11/25/2023 108 (H)  70 - 99 mg/dL Final   Glucose reference range applies only to samples taken after fasting for at least 8 hours.   BUN 11/25/2023 9  6 - 20 mg/dL Final   Creatinine, Ser 11/25/2023 0.68  0.61 - 1.24 mg/dL Final   Calcium 84/69/6295 8.6 (L)  8.9 - 10.3 mg/dL Final   Total Protein 28/41/3244 7.0  6.5 - 8.1 g/dL Final   Albumin 10/05/7251 2.3 (L)  3.5 - 5.0 g/dL Final   AST 66/44/0347 81 (H)  15 - 41 U/L Final   ALT 11/25/2023 58 (H)  0 - 44 U/L Final   Alkaline Phosphatase 11/25/2023 84  38 - 126 U/L Final   Total Bilirubin 11/25/2023 0.7  0.0 - 1.2 mg/dL Final   GFR, Estimated 11/25/2023 >60  >60 mL/min Final   Comment: (NOTE) Calculated using the CKD-EPI Creatinine Equation (2021)    Anion gap 11/25/2023 8  5 - 15 Final   Performed at [facility], [address] [place], [place] [address]   Iron 11/25/2023 21 (L)  45 - 182 ug/dL Final   TIBC 63/87/5643 280  250 - 450 ug/dL Final   Saturation Ratios 11/25/2023 8 (L)  17.9 - 39.5 % Final   UIBC 11/25/2023 259  ug/dL Final   Performed at [facility] Lab, [address], [place], [place] [address]   Vitamin B-12 11/25/2023 549  180 - 914 pg/mL Final   Comment: (NOTE) This assay is not validated for testing neonatal or myeloproliferative syndrome specimens for Vitamin B12 levels. Performed at [facility], [address], [place], [place] [address]    Ferritin 11/25/2023 217  24 - 336 ng/mL Final   Performed at [facility], [address], [place], [place] [address]  Admission on 11/19/2023, Discharged on 11/20/2023  Component Date Value Ref Range Status   Sodium 11/19/2023 138  135 - 145 mmol/L Final   Potassium 11/19/2023 3.4 (L)  3.5 - 5.1 mmol/L Final   Chloride 11/19/2023 103  98 - 111 mmol/L Final   CO2 11/19/2023 26  22 - 32 mmol/L Final   Glucose, Bld 11/19/2023 123 (H)  70 - 99 mg/dL Final   Glucose reference range applies only to samples taken  after fasting for at least 8 hours.   BUN 11/19/2023 <5 (L)  6 - 20 mg/dL Final   Creatinine, Ser 11/19/2023 0.50 (L)  0.61 - 1.24 mg/dL Final   Calcium 16/10/930 8.2 (L)  8.9 - 10.3 mg/dL Final   GFR, Estimated 11/19/2023 >60  >60 mL/min Final   Comment: (NOTE) Calculated using the CKD-EPI Creatinine Equation (2021)    Anion gap 11/19/2023 9  5 - 15 Final   Performed at [facility], [address] [place], [place] [address]   WBC 11/19/2023 8.4  4.0 - 10.5 K/uL Final   RBC 11/19/2023 4.00 (L)  4.22 - 5.81 MIL/uL Final   Hemoglobin 11/19/2023 12.6 (L)  13.0 - 17.0 g/dL Final   HCT 22/11/5425 37.7 (L)  39.0 - 52.0 % Final   MCV 11/19/2023 94.3  80.0 - 100.0 fL Final   MCH 11/19/2023 31.5  26.0 - 34.0 pg Final   MCHC 11/19/2023 33.4  30.0 - 36.0 g/dL Final   RDW 03/26/7627 13.2  11.5 - 15.5 % Final   Platelets 11/19/2023 267  150 -  400 K/uL Final   nRBC 11/19/2023 0.0  0.0 - 0.2 % Final   Neutrophils Relative % 11/19/2023 65  % Final   Neutro Abs 11/19/2023 5.5  1.7 - 7.7 K/uL Final   Lymphocytes Relative 11/19/2023 22  % Final   Lymphs Abs 11/19/2023 1.9  0.7 - 4.0 K/uL Final   Monocytes Relative 11/19/2023 11  % Final   Monocytes Absolute 11/19/2023 0.9  0.1 - 1.0 K/uL Final   Eosinophils Relative 11/19/2023 1  % Final   Eosinophils Absolute 11/19/2023 0.1  0.0 - 0.5 K/uL Final   Basophils Relative 11/19/2023 1  % Final   Basophils Absolute 11/19/2023 0.1  0.0 - 0.1 K/uL Final   Immature Granulocytes 11/19/2023 0  % Final   Abs Immature Granulocytes 11/19/2023 0.03  0.00 - 0.07 K/uL Final   Performed at [facility], [address], [place], [place] [address]   Sed Rate 11/19/2023 77 (H)  0 - 16 mm/hr Final   Performed at [facility], [address] [place], [place] [address]   CRP 11/19/2023 4.2 (H)  <1.0 mg/dL Final   Performed at [facility], [address], [place], [place] [address]   Sodium 11/19/2023 141   135 - 145 mmol/L Final   Potassium 11/19/2023 3.5  3.5 - 5.1 mmol/L Final   Chloride 11/19/2023 101  98 - 111 mmol/L Final   BUN 11/19/2023 <3 (L)  6 - 20 mg/dL Final   Creatinine, Ser 11/19/2023 0.70  0.61 - 1.24 mg/dL Final   Glucose, Bld 91/47/8295 126 (H)  70 - 99 mg/dL Final   Glucose reference range applies only to samples taken after fasting for at least 8 hours.   Calcium, Ion 11/19/2023 1.05 (L)  1.15 - 1.40 mmol/L Final   TCO2 11/19/2023 27  22 - 32 mmol/L Final   Hemoglobin 11/19/2023 12.9 (L)  13.0 - 17.0 g/dL Final   HCT 62/13/0865 38.0 (L)  39.0 - 52.0 % Final   SARS Coronavirus 2 by RT PCR 11/19/2023 NEGATIVE  NEGATIVE Final   Comment: (NOTE) SARS-CoV-2 target nucleic acids are NOT DETECTED.  The SARS-CoV-2 RNA is generally detectable in upper respiratory specimens during the acute phase of infection. The lowest concentration of SARS-CoV-2 viral copies this assay can detect is 138 copies/mL. A negative result does not preclude SARS-Cov-2 infection and should not be used as the sole basis for treatment or other patient management decisions. A negative result may occur with  improper specimen collection/handling, submission of specimen other than nasopharyngeal swab, presence of viral mutation(s) within the areas targeted by this assay, and inadequate number of viral copies(<138 copies/mL). A negative result must be combined with clinical observations, patient history, and epidemiological information. The expected result is Negative.  Fact Sheet for Patients:  BloggerCourse.com  Fact Sheet for Healthcare Providers:  SeriousBroker.it  This test is no                          t yet approved or cleared by the [place] FDA and  has been authorized for detection and/or diagnosis of SARS-CoV-2 by FDA under an Emergency Use Authorization (EUA). This EUA will remain  in effect (meaning this test can be used) for the  duration of the COVID-19 declaration under Section 564(b)(1) of the Act, 21 U.S.C.section 360bbb-3(b)(1), unless the authorization is terminated  or revoked sooner.       Influenza A  by PCR 11/19/2023 NEGATIVE  NEGATIVE Final   Influenza B by PCR 11/19/2023 NEGATIVE  NEGATIVE Final   Comment: (NOTE) The Xpert Xpress SARS-CoV-2/FLU/RSV plus assay is intended as an aid in the diagnosis of influenza from Nasopharyngeal swab specimens and should not be used as a sole basis for treatment. Nasal washings and aspirates are unacceptable for Xpert Xpress SARS-CoV-2/FLU/RSV testing.  Fact Sheet for Patients: BloggerCourse.com  Fact Sheet for Healthcare Providers: SeriousBroker.it  This test is not yet approved or cleared by the [place] FDA and has been authorized for detection and/or diagnosis of SARS-CoV-2 by FDA under an Emergency Use Authorization (EUA). This EUA will remain in effect (meaning this test can be used) for the duration of the COVID-19 declaration under Section 564(b)(1) of the Act, 21 U.S.C. section 360bbb-3(b)(1), unless the authorization is terminated or revoked.     Resp Syncytial Virus by PCR 11/19/2023 NEGATIVE  NEGATIVE Final   Comment: (NOTE) Fact Sheet for Patients: BloggerCourse.com  Fact Sheet for Healthcare Providers: SeriousBroker.it  This test is not yet approved or cleared by the [place] FDA and has been authorized for detection and/or diagnosis of SARS-CoV-2 by FDA under an Emergency Use Authorization (EUA). This EUA will remain in effect (meaning this test can be used) for the duration of the COVID-19 declaration under Section 564(b)(1) of the Act, 21 U.S.C. section 360bbb-3(b)(1), unless the authorization is terminated or revoked.  Performed at [facility], [address], [place], [place] [address]   Admission on  11/09/2023, Discharged on 11/09/2023  Component Date Value Ref Range Status   Color, Urine 11/09/2023 AMBER (A)  YELLOW Final   BIOCHEMICALS MAY BE AFFECTED BY COLOR   APPearance 11/09/2023 CLEAR  CLEAR Final   Specific Gravity, Urine 11/09/2023 1.036 (H)  1.005 - 1.030 Final   pH 11/09/2023 5.0  5.0 - 8.0 Final   Glucose, UA 11/09/2023 NEGATIVE  NEGATIVE mg/dL Final   Hgb urine dipstick 11/09/2023 NEGATIVE  NEGATIVE Final   Bilirubin Urine 11/09/2023 SMALL (A)  NEGATIVE Final   Ketones, ur 11/09/2023 NEGATIVE  NEGATIVE mg/dL Final   Protein, ur 40/98/1191 30 (A)  NEGATIVE mg/dL Final   Nitrite 47/82/9562 NEGATIVE  NEGATIVE Final   Leukocytes,Ua 11/09/2023 NEGATIVE  NEGATIVE Final   RBC / HPF 11/09/2023 0-5  0 - 5 RBC/hpf Final   WBC, UA 11/09/2023 0-5  0 - 5 WBC/hpf Final   Bacteria, UA 11/09/2023 NONE SEEN  NONE SEEN Final   Squamous Epithelial / HPF 11/09/2023 0-5  0 - 5 /HPF Final   Mucus 11/09/2023 PRESENT   Final   Performed at [facility], [address], [place], [place] [address]   Opiates 11/09/2023 POSITIVE (A)  NONE DETECTED Final   Cocaine 11/09/2023 POSITIVE (A)  NONE DETECTED Final   Benzodiazepines 11/09/2023 POSITIVE (A)  NONE DETECTED Final   Amphetamines 11/09/2023 NONE DETECTED  NONE DETECTED Final   Tetrahydrocannabinol 11/09/2023 POSITIVE (A)  NONE DETECTED Final   Barbiturates 11/09/2023 NONE DETECTED  NONE DETECTED Final   Comment: (NOTE) DRUG SCREEN FOR MEDICAL PURPOSES ONLY.  IF CONFIRMATION IS NEEDED FOR ANY PURPOSE, NOTIFY LAB WITHIN 5 DAYS.  LOWEST DETECTABLE LIMITS FOR URINE DRUG SCREEN Drug Class                     Cutoff (ng/mL) Amphetamine and metabolites    1000 Barbiturate and metabolites    200 Benzodiazepine  200 Opiates and metabolites        300 Cocaine and metabolites        300 THC                            50 Performed at [facility], [address], [place], [place]  [address]   Admission on 08/10/2023, Discharged on 08/14/2023  Component Date Value Ref Range Status   Sodium 08/10/2023 137  135 - 145 mmol/L Final   Potassium 08/10/2023 3.4 (L)  3.5 - 5.1 mmol/L Final   Chloride 08/10/2023 100  98 - 111 mmol/L Final   CO2 08/10/2023 25  22 - 32 mmol/L Final   Glucose, Bld 08/10/2023 109 (H)  70 - 99 mg/dL Final   Glucose reference range applies only to samples taken after fasting for at least 8 hours.   BUN 08/10/2023 11  6 - 20 mg/dL Final   Creatinine, Ser 08/10/2023 0.71  0.61 - 1.24 mg/dL Final   Calcium 08/65/7846 8.8 (L)  8.9 - 10.3 mg/dL Final   Total Protein 96/29/5284 7.9  6.5 - 8.1 g/dL Final   Albumin 13/24/4010 3.8  3.5 - 5.0 g/dL Final   AST 27/25/3664 466 (H)  15 - 41 U/L Final   ALT 08/10/2023 264 (H)  0 - 44 U/L Final   Alkaline Phosphatase 08/10/2023 112  38 - 126 U/L Final   Total Bilirubin 08/10/2023 1.2 (H)  <1.2 mg/dL Final   GFR, Estimated 08/10/2023 >60  >60 mL/min Final   Comment: (NOTE) Calculated using the CKD-EPI Creatinine Equation (2021)    Anion gap 08/10/2023 12  5 - 15 Final   Performed at [facility], [address], [place], [place] [address]   Alcohol, Ethyl (B) 08/10/2023 144 (H)  <10 mg/dL Final   Comment: (NOTE) Lowest detectable limit for serum alcohol is 10 mg/dL.  For medical purposes only. Performed at [facility], [address], [place], [place] [address]    Salicylate Lvl 08/10/2023 <7.0 (L)  7.0 - 30.0 mg/dL Final   Performed at [facility], [address], [place], [place] [address]   Acetaminophen (Tylenol), Serum 08/10/2023 <10 (L)  10 - 30 ug/mL Final   Comment: (NOTE) Therapeutic concentrations vary significantly. A range of 10-30 ug/mL  may be an effective concentration for many patients. However, some  are best treated at concentrations outside of this range. Acetaminophen concentrations >150 ug/mL at 4 hours after ingestion  and  >50 ug/mL at 12 hours after ingestion are often associated with  toxic reactions.  Performed at [facility], [address], [place], [place] [address]    WBC 08/10/2023 8.0  4.0 - 10.5 K/uL Final   RBC 08/10/2023 4.13 (L)  4.22 - 5.81 MIL/uL Final   Hemoglobin 08/10/2023 13.8  13.0 - 17.0 g/dL Final   HCT 95/18/8416 39.4  39.0 - 52.0 % Final   MCV 08/10/2023 95.4  80.0 - 100.0 fL Final   MCH 08/10/2023 33.4  26.0 - 34.0 pg Final   MCHC 08/10/2023 35.0  30.0 - 36.0 g/dL Final   RDW 60/63/0160 13.1  11.5 - 15.5 % Final   Platelets 08/10/2023 226  150 - 400 K/uL Final   nRBC 08/10/2023 0.0  0.0 - 0.2 % Final   Performed at [facility], [address] [place], [place] [address]   Opiates 08/10/2023  NONE DETECTED  NONE DETECTED Final   Cocaine 08/10/2023 POSITIVE (A)  NONE DETECTED Final   Benzodiazepines 08/10/2023 NONE DETECTED  NONE DETECTED Final   Amphetamines 08/10/2023 POSITIVE (A)  NONE DETECTED Final   Tetrahydrocannabinol 08/10/2023 POSITIVE (A)  NONE DETECTED Final   Barbiturates 08/10/2023 NONE DETECTED  NONE DETECTED Final   Comment: (NOTE) DRUG SCREEN FOR MEDICAL PURPOSES ONLY.  IF CONFIRMATION IS NEEDED FOR ANY PURPOSE, NOTIFY LAB WITHIN 5 DAYS.  LOWEST DETECTABLE LIMITS FOR URINE DRUG SCREEN Drug Class                     Cutoff (ng/mL) Amphetamine and metabolites    1000 Barbiturate and metabolites    200 Benzodiazepine                 200 Opiates and metabolites        300 Cocaine and metabolites        300 THC                            50 Performed at [name], [address], [place], [place] [address]    Total CK 08/10/2023 461 (H)  49 - 397 U/L Final   Performed at [facility], [address] [place], [place] [address]   Hepatitis B Surface Ag 08/11/2023 NON REACTIVE  NON REACTIVE Final   HCV Ab 08/11/2023 Reactive (A)  NON REACTIVE Final   Comment: (NOTE) The CDC  recommends that a Reactive HCV antibody result be followed up  with a HCV Nucleic Acid Amplification test.     Hep A IgM 08/11/2023 NON REACTIVE  NON REACTIVE Final   Hep B C IgM 08/11/2023 NON REACTIVE  NON REACTIVE Final   Performed at [facility], [address], [place], [place] [address]   Total Protein 08/12/2023 7.4  6.5 - 8.1 g/dL Final   Albumin 21/30/8657 3.2 (L)  3.5 - 5.0 g/dL Final   AST 84/69/6295 381 (H)  15 - 41 U/L Final   ALT 08/12/2023 247 (H)  0 - 44 U/L Final   Alkaline Phosphatase 08/12/2023 97  38 - 126 U/L Final   Total Bilirubin 08/12/2023 1.0  <1.2 mg/dL Final   Bilirubin, Direct 08/12/2023 0.2  0.0 - 0.2 mg/dL Final   Indirect Bilirubin 08/12/2023 0.8  0.3 - 0.9 mg/dL Final   Performed at [facility], [address], [place], [place] [address]   Total CK 08/12/2023 41 (L)  49 - 397 U/L Final   Performed at [facility], [address] [place], [place] [address]   Ammonia 08/12/2023 48 (H)  9 - 35 umol/L Final   Performed at [facility], [address] [place], [place] [address]  Admission on 08/04/2023, Discharged on 08/07/2023  Component Date Value Ref Range Status   Amphetamines, Urine 08/04/2023 Negative  Cutoff=1000 ng/mL Final   Amphetamine test includes Amphetamine and Methamphetamine.   Barbiturate, Ur 08/04/2023 Negative  Cutoff=300 ng/mL Final   Benzodiazepine Quant, Ur 08/04/2023 Negative  Cutoff=300 ng/mL Final   Cannabinoid Quant, Ur 08/04/2023 Negative  Cutoff=50 ng/mL Final   Cocaine (Metab.) 08/04/2023 See Final Results  Cutoff=300 ng/mL Final   Opiate Quant, Ur 08/04/2023 Negative  Cutoff=300 ng/mL Final   Opiate test includes Codeine and Morphine only.   Phencyclidine, Ur 08/04/2023 Negative  Cutoff=25 ng/mL Final   Methadone  Screen, Urine 08/04/2023 Negative  Cutoff=300 ng/mL Final   Propoxyphene, Urine 08/04/2023 Negative  Cutoff=300 ng/mL Final   Ethanol U, Cloretta Ned  08/04/2023 See Final Results  Cutoff=0.020 % Final   Comment: (NOTE) Performed At: [facility] [address] [place], [place] [id number] Avis Epley PhD [id number]    WBC 08/04/2023 5.8  4.0 - 10.5 K/uL Final   RBC 08/04/2023 3.93 (L)  4.22 - 5.81 MIL/uL Final   Hemoglobin 08/04/2023 12.9 (L)  13.0 - 17.0 g/dL Final   HCT 13/05/6577 36.4 (L)  39.0 - 52.0 % Final   MCV 08/04/2023 92.6  80.0 - 100.0 fL Final   MCH 08/04/2023 32.8  26.0 - 34.0 pg Final   MCHC 08/04/2023 35.4  30.0 - 36.0 g/dL Final   RDW 46/96/2952 12.7  11.5 - 15.5 % Final   Platelets 08/04/2023 147 (L)  150 - 400 K/uL Final   Comment: SPECIMEN CHECKED FOR CLOTS REPEATED TO VERIFY PLATELET COUNT CONFIRMED BY SMEAR    nRBC 08/04/2023 0.0  0.0 - 0.2 % Final   Neutrophils Relative % 08/04/2023 76  % Final   Neutro Abs 08/04/2023 4.4  1.7 - 7.7 K/uL Final   Lymphocytes Relative 08/04/2023 16  % Final   Lymphs Abs 08/04/2023 0.9  0.7 - 4.0 K/uL Final   Monocytes Relative 08/04/2023 6  % Final   Monocytes Absolute 08/04/2023 0.3  0.1 - 1.0 K/uL Final   Eosinophils Relative 08/04/2023 0  % Final   Eosinophils Absolute 08/04/2023 0.0  0.0 - 0.5 K/uL Final   Basophils Relative 08/04/2023 1  % Final   Basophils Absolute 08/04/2023 0.0  0.0 - 0.1 K/uL Final   Immature Granulocytes 08/04/2023 1  % Final   Abs Immature Granulocytes 08/04/2023 0.04  0.00 - 0.07 K/uL Final   Performed at [facility], [address], [place], [place] [address]   Sodium 08/04/2023 134 (L)  135 - 145 mmol/L Final   Potassium 08/04/2023 3.0 (L)  3.5 - 5.1 mmol/L Final   Chloride 08/04/2023 95 (L)  98 - 111 mmol/L Final   CO2 08/04/2023 24  22 - 32 mmol/L Final   Glucose, Bld 08/04/2023 115 (H)  70 - 99 mg/dL Final   Glucose reference range applies only to samples taken after fasting for at least 8 hours.   BUN 08/04/2023 5 (L)  6 - 20 mg/dL Final   Creatinine, Ser 08/04/2023 0.56 (L)  0.61 - 1.24 mg/dL Final   Calcium 44/10/270  8.2 (L)  8.9 - 10.3 mg/dL Final   Total Protein 53/66/4403 7.7  6.5 - 8.1 g/dL Final   Albumin 47/42/5956 3.6  3.5 - 5.0 g/dL Final   AST 38/75/6433 205 (H)  15 - 41 U/L Final   ALT 08/04/2023 99 (H)  0 - 44 U/L Final   Alkaline Phosphatase 08/04/2023 98  38 - 126 U/L Final   Total Bilirubin 08/04/2023 1.0  0.3 - 1.2 mg/dL Final   GFR, Estimated 08/04/2023 >60  >60 mL/min Final   Comment: (NOTE) Calculated using the CKD-EPI Creatinine Equation (2021)    Anion gap 08/04/2023 15  5 - 15 Final   Performed at [facility], [address], [place], [place] [address]   Hgb A1c MFr Bld 08/04/2023 5.4  4.8 - 5.6 % Final   Comment: (NOTE) Pre diabetes:          5.7%-6.4%  Diabetes:              >  6.4%  Glycemic control for   <7.0% adults with diabetes    Mean Plasma Glucose 08/04/2023 108.28  mg/dL Final   Performed at [facility], [address] [place], [place] [address]   TSH 08/04/2023 0.613  0.350 - 4.500 uIU/mL Final   Comment: Performed by a 3rd Generation assay with a functional sensitivity of <=0.01 uIU/mL. Performed at [facility], [address] [place], [place] [address]    Neisseria Gonorrhea 08/04/2023 Negative   Final   Chlamydia 08/04/2023 Negative   Final   Comment 08/04/2023 Normal Reference Ranger Chlamydia - Negative   Final   Comment 08/04/2023 Normal Reference Range Neisseria Gonorrhea - Negative   Final   RPR Ser Ql 08/04/2023 NON REACTIVE  NON REACTIVE Final   Performed at [facility], [address], [place], [place] [address]   HIV-1 P24 Antigen - HIV24 08/04/2023 NON REACTIVE  NON REACTIVE Final   Comment: (NOTE) Detection of p24 may be inhibited by biotin in the sample, causing false negative results in acute infection.    HIV 1/2 Antibodies 08/04/2023 NON REACTIVE  NON REACTIVE Final   Interpretation (HIV Ag Ab) 08/04/2023 A non reactive test result means that HIV 1 or HIV 2 antibodies and HIV 1 p24 antigen were not detected in  the specimen.   Final   Performed at [facility], [address] [place], [place] [address]   Hepatitis B Surface Ag 08/04/2023 NON REACTIVE  NON REACTIVE Final   HCV Ab 08/04/2023 Reactive (A)  NON REACTIVE Final   Comment: (NOTE) The CDC recommends that a Reactive HCV antibody result be followed up  with a HCV Nucleic Acid Amplification test.     Hep A IgM 08/04/2023 NON REACTIVE  NON REACTIVE Final   Hep B C IgM 08/04/2023 NON REACTIVE  NON REACTIVE Final   Performed at [facility], [address], [place], [place] [address]   POC Amphetamine UR 08/04/2023 None Detected  NONE DETECTED (Cut Off Level 1000 ng/mL) Final   POC Secobarbital (BAR) 08/04/2023 None Detected  NONE DETECTED (Cut Off Level 300 ng/mL) Final   POC Buprenorphine (BUP) 08/04/2023 None Detected  NONE DETECTED (Cut Off Level 10 ng/mL) Final   POC Oxazepam (BZO) 08/04/2023 None Detected  NONE DETECTED (Cut Off Level 300 ng/mL) Final   POC Cocaine UR 08/04/2023 Positive (A)  NONE DETECTED (Cut Off Level 300 ng/mL) Final   POC Methamphetamine UR 08/04/2023 None Detected  NONE DETECTED (Cut Off Level 1000 ng/mL) Final   POC Morphine 08/04/2023 None Detected  NONE DETECTED (Cut Off Level 300 ng/mL) Final   POC Methadone UR 08/04/2023 None Detected  NONE DETECTED (Cut Off Level 300 ng/mL) Final   POC Oxycodone UR 08/04/2023 None Detected  NONE DETECTED (Cut Off Level 100 ng/mL) Final   POC Marijuana UR 08/04/2023 Positive (A)  NONE DETECTED (Cut Off Level 50 ng/mL) Final   Cocaine Metab Quant, Ur 08/04/2023 Positive (A)  Cutoff=300 Final   Benzoylecgonine GC/MS Conf 08/04/2023 35,080  Cutoff=150 ng/mL Final   Comment: (NOTE) Performed At: [facility] [address] [place], [place] [id number] Avis Epley PhD YT:[id number]    Ethanol, Urine 08/04/2023 FID) (A)  Cutoff=0.020 Final   Comment: Positive Confirmation performed by Gas Chromatography (GC    Ethanol, Ur - Confirmation 08/04/2023 0.099   Cutoff=0.020 % Final   Comment: (NOTE) GLUCOSE NEGATIVE Performed At: [facility] [address] [place], [place] [id number] Avis Epley PhD [id number]   Admission on 06/15/2023, Discharged on 06/16/2023  Component Date Value Ref Range Status   WBC 06/15/2023 5.8  4.0 - 10.5 K/uL Final   RBC 06/15/2023 4.61  4.22 - 5.81 MIL/uL Final   Hemoglobin 06/15/2023 15.5  13.0 - 17.0 g/dL Final   HCT 95/62/1308 46.0  39.0 - 52.0 % Final   MCV 06/15/2023 99.8  80.0 - 100.0 fL Final   MCH 06/15/2023 33.6  26.0 - 34.0 pg Final   MCHC 06/15/2023 33.7  30.0 - 36.0 g/dL Final   RDW 65/78/4696 13.3  11.5 - 15.5 % Final   Platelets 06/15/2023 244  150 - 400 K/uL Final   nRBC 06/15/2023 0.0  0.0 - 0.2 % Final   Neutrophils Relative % 06/15/2023 56  % Final   Neutro Abs 06/15/2023 3.3  1.7 - 7.7 K/uL Final   Lymphocytes Relative 06/15/2023 33  % Final   Lymphs Abs 06/15/2023 1.9  0.7 - 4.0 K/uL Final   Monocytes Relative 06/15/2023 6  % Final   Monocytes Absolute 06/15/2023 0.4  0.1 - 1.0 K/uL Final   Eosinophils Relative 06/15/2023 2  % Final   Eosinophils Absolute 06/15/2023 0.1  0.0 - 0.5 K/uL Final   Basophils Relative 06/15/2023 3  % Final   Basophils Absolute 06/15/2023 0.2 (H)  0.0 - 0.1 K/uL Final   Immature Granulocytes 06/15/2023 0  % Final   Abs Immature Granulocytes 06/15/2023 0.01  0.00 - 0.07 K/uL Final   Performed at [facility], [address] [place], [place] [address]   Sodium 06/15/2023 140  135 - 145 mmol/L Final   Potassium 06/15/2023 3.8  3.5 - 5.1 mmol/L Final   Chloride 06/15/2023 103  98 - 111 mmol/L Final   CO2 06/15/2023 28  22 - 32 mmol/L Final   Glucose, Bld 06/15/2023 108 (H)  70 - 99 mg/dL Final   Glucose reference range applies only to samples taken after fasting for at least 8 hours.   BUN 06/15/2023 5 (L)  6 - 20 mg/dL Final   Creatinine, Ser 06/15/2023 0.65  0.61 - 1.24 mg/dL Final   Calcium 41/32/4401 8.3 (L)  8.9 - 10.3 mg/dL Final    Total Protein 06/15/2023 8.6 (H)  6.5 - 8.1 g/dL Final   Albumin 02/72/5366 3.6  3.5 - 5.0 g/dL Final   AST 44/12/4740 296 (H)  15 - 41 U/L Final   ALT 06/15/2023 205 (H)  0 - 44 U/L Final   Alkaline Phosphatase 06/15/2023 145 (H)  38 - 126 U/L Final   Total Bilirubin 06/15/2023 0.4  0.3 - 1.2 mg/dL Final   GFR, Estimated 06/15/2023 >60  >60 mL/min Final   Comment: (NOTE) Calculated using the CKD-EPI Creatinine Equation (2021)    Anion gap 06/15/2023 9  5 - 15 Final   Performed at [facility], [address] [place], [place] [address]   Lipase 06/15/2023 27  11 - 51 U/L Final   Performed at [facility], [address], [place], [place] [address]   Specimen Source 06/16/2023 URINE, CLEAN CATCH   Final   Color, Urine 06/16/2023 YELLOW  YELLOW Final   APPearance 06/16/2023 HAZY (A)  CLEAR Final   Specific Gravity, Urine 06/16/2023 1.015  1.005 - 1.030 Final   pH 06/16/2023 6.0  5.0 - 8.0 Final   Glucose, UA 06/16/2023 NEGATIVE  NEGATIVE mg/dL Final   Hgb  urine dipstick 06/16/2023 NEGATIVE  NEGATIVE Final   Bilirubin Urine 06/16/2023 NEGATIVE  NEGATIVE Final   Ketones, ur 06/16/2023 NEGATIVE  NEGATIVE mg/dL Final   Protein, ur 95/62/1308 NEGATIVE  NEGATIVE mg/dL Final   Nitrite 65/78/4696 NEGATIVE  NEGATIVE Final   Leukocytes,Ua 06/16/2023 NEGATIVE  NEGATIVE Final   RBC / HPF 06/16/2023 0-5  0 - 5 RBC/hpf Final   WBC, UA 06/16/2023 0-5  0 - 5 WBC/hpf Final   Comment:        Reflex urine culture not performed if WBC <=10, OR if Squamous epithelial cells >5. If Squamous epithelial cells >5 suggest recollection.    Bacteria, UA 06/16/2023 NONE SEEN  NONE SEEN Final   Squamous Epithelial / HPF 06/16/2023 0-5  0 - 5 /HPF Final   Mucus 06/16/2023 PRESENT   Final   Performed at [facility], [address] [place], [place] [address]   Alcohol, Ethyl (B) 06/15/2023 288 (H)  <10 mg/dL Final   Comment: (NOTE) Lowest detectable  limit for serum alcohol is 10 mg/dL.  For medical purposes only. Performed at [facility], [address], [place], [place] [address]    Salicylate Lvl 06/15/2023 <7.0 (L)  7.0 - 30.0 mg/dL Final   Performed at [facility], [address] [place], [place] [address]   Acetaminophen (Tylenol), Serum 06/15/2023 <10 (L)  10 - 30 ug/mL Final   Comment: (NOTE) Therapeutic concentrations vary significantly. A range of 10-30 ug/mL  may be an effective concentration for many patients. However, some  are best treated at concentrations outside of this range. Acetaminophen concentrations >150 ug/mL at 4 hours after ingestion  and >50 ug/mL at 12 hours after ingestion are often associated with  toxic reactions.  Performed at [facility], [address] [place], [place] [address]    Opiates 06/16/2023 NONE DETECTED  NONE DETECTED Final   Cocaine 06/16/2023 POSITIVE (A)  NONE DETECTED Final   Benzodiazepines 06/16/2023 NONE DETECTED  NONE DETECTED Final   Amphetamines 06/16/2023 NONE DETECTED  NONE DETECTED Final   Tetrahydrocannabinol 06/16/2023 POSITIVE (A)  NONE DETECTED Final   Barbiturates 06/16/2023 NONE DETECTED  NONE DETECTED Final   Comment: (NOTE) DRUG SCREEN FOR MEDICAL PURPOSES ONLY.  IF CONFIRMATION IS NEEDED FOR ANY PURPOSE, NOTIFY LAB WITHIN 5 DAYS.  LOWEST DETECTABLE LIMITS FOR URINE DRUG SCREEN Drug Class                     Cutoff (ng/mL) Amphetamine and metabolites    1000 Barbiturate and metabolites    200 Benzodiazepine                 200 Opiates and metabolites        300 Cocaine and metabolites        300 THC                            50 Performed at [facility], [address] [place], [place] [address]   There may be more visits with results that are not included.    Allergies: Codeine  Medications:  Facility Ordered Medications  Medication   acetaminophen (TYLENOL)  tablet 650 mg   alum & mag hydroxide-simeth (MAALOX/MYLANTA) 200-200-20 MG/5ML suspension 30 mL   magnesium hydroxide (MILK OF MAGNESIA) suspension 30 mL   thiamine (VITAMIN B1) tablet 100 mg   multivitamin with minerals tablet 1  tablet   LORazepam (ATIVAN) tablet 1 mg   hydrOXYzine (ATARAX) tablet 25 mg   loperamide (IMODIUM) capsule 2-4 mg   ondansetron (ZOFRAN-ODT) disintegrating tablet 4 mg   haloperidol (HALDOL) tablet 5 mg   And   diphenhydrAMINE (BENADRYL) capsule 50 mg   haloperidol lactate (HALDOL) injection 5 mg   And   diphenhydrAMINE (BENADRYL) injection 50 mg   And   LORazepam (ATIVAN) injection 2 mg   haloperidol lactate (HALDOL) injection 10 mg   And   diphenhydrAMINE (BENADRYL) injection 50 mg   And   LORazepam (ATIVAN) injection 2 mg   traZODone (DESYREL) tablet 50 mg   gabapentin (NEURONTIN) capsule 400 mg   nicotine polacrilex (NICORETTE) gum 2 mg   dicyclomine (BENTYL) tablet 20 mg   methocarbamol (ROBAXIN) tablet 500 mg   naproxen (NAPROSYN) tablet 500 mg   [COMPLETED] thiamine (VITAMIN B1) injection 100 mg   PTA Medications  Medication Sig   DULoxetine (CYMBALTA) 30 MG capsule Take 1 capsule (30 mg total) by mouth daily. (Patient not taking: Reported on 12/07/2023)   gabapentin (NEURONTIN) 300 MG capsule Take 2 capsules (600 mg total) by mouth 3 (three) times daily.   hydrOXYzine (ATARAX) 25 MG tablet Take 1 tablet (25 mg total) by mouth every 6 (six) hours as needed for anxiety.   nicotine (NICODERM CQ - DOSED IN MG/24 HR) 7 mg/24hr patch Place 1 patch (7 mg total) onto the skin daily.   traZODone (DESYREL) 50 MG tablet Take 1 tablet (50 mg total) by mouth at bedtime as needed for sleep.   acetaminophen (TYLENOL) 325 MG tablet Take 2 tablets (650 mg total) by mouth every 6 (six) hours as needed for mild pain (pain score 1-3).   naproxen sodium (ALEVE) 220 MG tablet Take 220 mg by mouth.   predniSONE (DELTASONE) 20 MG tablet Take 2 tablets (40 mg total) by  mouth daily with breakfast for 5 days.    Long Term Goals: Improvement in symptoms so as ready for discharge  Short Term Goals: Patient will verbalize feelings in meetings with treatment team members., Patient will attend at least of 50% of the groups daily., and Pt will complete the PHQ9 on admission, day 3 and discharge.  Medical Decision Making  Pt is appropriate for treatment at [facility] Crisis [facility] for detox.  UDS + cocaine, benzodiazepines and THC.    Polysubstance abuse -COWS with clonidine taper -CIWA with Ativan -Continue gabapentin 400 mg 3 times daily  Normocytic anemia -Could be iron deficiency anemia or hemodilution  Lab and imaging reviewed and include:      SARS Coronavirus 2 by RT PCR (hospital order, performed in [facility] hospital lab) *cepheid single result test* Anterior Nasal Swab         Ethanol         CBC with Differential         Comprehensive metabolic panel         Lipid panel         Magnesium         TSH         Hemoglobin A1c         POCT Urine Drug Screen - (I-Screen)      Recommendations  Based on my evaluation the patient does not appear to have an emergency medical condition.  Park Pope, MD 12/10/23  2:51 PM

## 2023-12-10 NOTE — ED Notes (Signed)
 Patient is resting in bed with eyes closed and aroused to name being called. He is wearing his personal clothing. He c/o generalized pain 7 and denies SIHI, AVH and anxiety and depression. He reports feeling paranoid and agitation for unknown reason. Patient will be continued to be monitored for safety per protocol and for changes in condition.

## 2023-12-11 DIAGNOSIS — G8921 Chronic pain due to trauma: Secondary | ICD-10-CM | POA: Diagnosis not present

## 2023-12-11 DIAGNOSIS — R262 Difficulty in walking, not elsewhere classified: Secondary | ICD-10-CM | POA: Diagnosis not present

## 2023-12-11 DIAGNOSIS — F1510 Other stimulant abuse, uncomplicated: Secondary | ICD-10-CM | POA: Diagnosis not present

## 2023-12-11 DIAGNOSIS — F1929 Other psychoactive substance dependence with unspecified psychoactive substance-induced disorder: Secondary | ICD-10-CM | POA: Diagnosis not present

## 2023-12-11 NOTE — ED Notes (Signed)
 Pt was provided dinner.

## 2023-12-11 NOTE — Group Note (Signed)
 Group Topic: Recovery Basics  Group Date: 12/11/2023 Start Time: 1000 End Time: 1100 Facilitators: Londell Moh, NT  Department: [facility]  Number of Participants: 7  Group Focus: check in Treatment Modality:  Psychoeducation Interventions utilized were other AA Purpose: enhance coping skills, explore maladaptive thinking, express feelings, and increase insight  Name: Mark Hammond Date of Birth: 1991/02/09  MR: [id number]    Level of Participation: Pt did not attend group Patients Problems:  Patient Active Problem List   Diagnosis Date Noted   Polysubstance abuse (HCC) 11/21/2023   Alcohol use disorder 08/04/2023   Stimulant use disorder 08/04/2023   Opioid use disorder 08/04/2023   Tobacco use disorder 08/04/2023   Cannabis use disorder 08/04/2023   Hepatitis C 03/01/2023   Anxiety state 01/14/2023   Insomnia 01/14/2023   Schizoaffective disorder (HCC) 01/13/2023   Schizophrenia (HCC) 11/15/2019   Polysubstance (including opioids) dependence, daily use (HCC)    Substance induced mood disorder (HCC) 10/24/2019   Methamphetamine use disorder, 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

## 2023-12-11 NOTE — Discharge Instructions (Signed)
 [facility] [address], [place], [address] [phone] phone  New Patient Assessment/Therapy Walk-Ins:  Monday and Wednesday: 8 am until slots are full. Every 1st and 2nd Fridays of the month: 1 pm - 5 pm.  NO ASSESSMENT/THERAPY WALK-INS ON TUESDAYS OR THURSDAYS  New Patient Assessment/Medication Management Walk-Ins:  Monday - Friday:  8 am - 11 am.  For all walk-ins, we ask that you arrive by 7:30 am because patients will be seen in the order of arrival.  Availability is limited; therefore, you may not be seen on the same day that you walk-in.  Our goal is to serve and meet the needs of our community to the best of our [place] ability.  SUBSTANCE USE TREATMENT for Medicaid and State Funded/IPRS  [facility] [address], [place], [address] [phone] phone NOTE: [facility] is no longer offering IOP services.  Serves those who are low-income or have no insurance.  [facility] [address] [place], [place], [address] [phone] phone [phone] fax NOTE: Does have Substance Abuse-Intensive Outpatient Program [facility]) as well as transitional housing if eligible.  [facility] [address] [place], [place], [address] [phone] phone [phone] fax  [facility] [phone] [place], [place], [address] [phone] phone [phone] fax  HALFWAY HOUSES:  [facility] [phone]  [employer].oxfordvacancies.com  12 STEP PROGRAMS:  Alcoholics Anonymous of [place] Software[employer]alet.be  Narcotics Anonymous of [place] [employer]ct.dk  Al-Anon of [place], [place] www.[place]alanon.org/find-meetings.html  Nar-Anon https://nar-anon.org/find-a-meetin  List of Residential placements:   [facility] in [place]: [phone]  [facility]: [phone]  [place], [place]  [phone]: Male and male facility; 30-day program: (uninsured and Medicaid such as Laurena Bering, [place], [place], partners)  [facility]: [phone]; men and women's facility; 28 days; Can have Medicaid tailored plan Tour manager or Partners)  [facility]: [phone] Karoline Caldwell or Larita Fife; 28 day program; must be fully detox; tailored Medicaid or no insurance  [address] in [place], [place]; [phone]; 28 day all males program; no insurance accepted  [facility] Referral in [place]: Gabriel Rung [phone] (no insurance or Medicaid only); 90 days; outpatient services but provide housing in apartments [place]  [facility] Admission: [phone]: Patient must complete phone screening for placement: [place], [place]; 6 month program; uninsured, Medicaid, and [employer].   [facility]: no insurance required; [phone]  [facility]: [phone]; Intake: Molly Maduro; Must fill out application online; Alecia Lemming Delay [phone] x [address] [facility] in [place], [place]: [phone]; Admissions Coordinators Mr. Maurine Minister or Barron Alvine; 90 day program.  [facility]: [place], [place] [phone]; Co-Ed 9 month to a year program; Online application; Men entry fee is $500 (6-53months);  [employer]: [address] [place], [place] [address]; no fee or insurance required; minimum of 2 years; Highly structured; work based; Intake Coordinator is Thayer Ohm [phone]  [facility] in [place], [place]: [phone]; Fax number is [phone]; website: www.[facility].org; Requires 3-6 page autobiography; 2 year program (18 months and then 25month transitional housing); Admission fee is $300; no insurance needed; work Automotive engineer in [place], [place]: [employer] Desk Staff: Danise Edge [phone]: They have a Men's Regenerations Program 6-39months. Free program; There is an initial $300 fee however, they are willing to work  with patients regarding that. Application is online.  [facility]: Admissions [phone] Doran Heater ext [phone]; Any 7-90 day program is out of pocket; 12  month program is free of charge; there is a $275 entry fee; Patient is responsible for own transportation

## 2023-12-11 NOTE — ED Notes (Signed)
 Pt was provided breakfast.

## 2023-12-11 NOTE — ED Notes (Signed)
 Patient is resting in bed awake without any distress noted. He denies SIHI, AVH, and reports anxiety and depression 7. He is wearing paper scrubs and reports sleeping okay with having night mares. He denies withdrawal s/s. Staff will continue to monitor safety and for changes in condition.

## 2023-12-11 NOTE — Group Note (Signed)
 Group Topic: Positive Affirmations  Group Date: 12/11/2023 Start Time: 1210 End Time: 1230 Facilitators: Jenean Lindau, RN  Department: [facility]  Number of Participants: 7  Group Focus: affirmation Treatment Modality:  Behavior Modification Therapy Interventions utilized were confrontation, exploration, and patient education Purpose: enhance coping skills, explore maladaptive thinking, express feelings, express irrational fears, improve communication skills, increase insight, regain self-worth, reinforce self-care, and relapse prevention strategies  Name: Mark Hammond Date of Birth: June 07, 1991  MR: [id number]    Level of Participation: moderate Quality of Participation: attentive and cooperative Interactions with others: gave feedback Mood/Affect: appropriate Triggers (if applicable):   Cognition: coherent/clear Progress: Moderate Response:   Plan: follow-up needed  Patients Problems:  Patient Active Problem List   Diagnosis Date Noted   Polysubstance abuse (HCC) 11/21/2023   Alcohol use disorder 08/04/2023   Stimulant use disorder 08/04/2023   Opioid use disorder 08/04/2023   Tobacco use disorder 08/04/2023   Cannabis use disorder 08/04/2023   Hepatitis C 03/01/2023   Anxiety state 01/14/2023   Insomnia 01/14/2023   Schizoaffective disorder (HCC) 01/13/2023   Schizophrenia (HCC) 11/15/2019   Polysubstance (including opioids) dependence, daily use (HCC)    Substance induced mood disorder (HCC) 10/24/2019   Methamphetamine use disorder, 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

## 2023-12-11 NOTE — ED Notes (Signed)
 Patient is sleeping. Respirations equal and unlabored, skin warm and dry. No change in assessment or acuity. Routine safety checks conducted according to facility protocol. Will continue to monitor for safety.

## 2023-12-11 NOTE — Discharge Planning (Signed)
 LCSW received phone call from patient's uncle [name] [phone] regarding disposition plan. Uncle reports patient informed him that he would be discharging on tomorrow and needed a place to stay. Uncle reports he does not have living arrangements for the patient, and reports feeling bad about the patient returning to the streets. Uncle reports due to the patient's condition is he concerned for the patient's safety at this time. Uncle reports the patient also informed him about a neurology appt and uncle wanted to know if this was inpatient or patient services. LCSW advised uncle that it was likely for outpatient services. Further questions directed to MD as LCSW could provide limited information. Uncle reports he is willing to support the patient if he is going inpatient somewhere, however he is not able to bring the patient to his home due to his work schedule. Uncle reports he has had conversations with the patient and would like to support him with continued recovery. Uncle made aware that referrals have been sent to [facility] and [facility] for review. Uncle reports he has spoke with Tameika in admissions at [facility], and was informed that they would not be able to accept him with any assistive device. LCSW awaiting update from Admissions. Uncle aware that phone screen has been requested by [facility]. Once update can be provided, LCSW will follow up at a later date. No other needs were reported by uncle at this time. LCSW will continue to follow and provide support to patient while on [facility] unit.   [name], LCSW Clinical Social Worker [place] [facility] Ph: [phone]

## 2023-12-11 NOTE — Group Note (Signed)
 Group Topic: Social Support  Group Date: 12/11/2023 Start Time: 2100 End Time: 2130 Facilitators: Rae Lips B  Department: [facility]  Number of Participants: 5  Group Focus: acceptance, activities of daily living skills, check in, communication, community group, daily focus, and family Treatment Modality:  Individual Therapy Interventions utilized were leisure development, story telling, and support Purpose: express feelings  Name: Mark Hammond Date of Birth: 11/09/1990  MR: [id number]    Level of Participation:Pt did not attend group Quality of Participation: attentive and cooperative Interactions with others: gave feedback Mood/Affect: appropriate Triggers (if applicable): NA Cognition: coherent/clear Progress: None Response: NA Plan: patient will be encouraged to go to groups.   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

## 2023-12-11 NOTE — Discharge Planning (Signed)
 LCSW attempted to get in contact with Mark Hammond at [facility] regarding patient referral, however received no answer. LCSW left voice message at 10:34am requesting phone call back. LCSW followed up with [facility] and was informed by Levon Hedger in Admissions that patient would need to call in to complete phone screening for consideration. Contact information has been provided to the patient for follow up and he reports he will call around lunch time. LCSW was also informed by RN that patient's friend called requesting information regarding plan for patient as he knows the patient will not get accepted to [facility] even with his own equipment. Patient provided LCSW with permission to speak with his uncle. [name] [phone]. LCSW attempted to contact uncle, however received no answer. LCSW left non emergency voice message requesting phone call back. No other needs to report at this time. Updates will be provided as received.   [name], LCSW Clinical Social Worker [place] [facility] Ph: [phone]

## 2023-12-11 NOTE — ED Notes (Signed)
 Pt was provided lunch

## 2023-12-11 NOTE — ED Provider Notes (Signed)
 Facility Based Crisis Progress Note  Date: 12/11/23 Patient Name: Mark Hammond MRN: [id number] Chief Complaint: Depression, substance use  Diagnoses:  Final diagnoses:  Polysubstance (including opioids) dependence, daily use (HCC)  Stimulant use disorder  Tobacco use disorder  Substance induced mood disorder (HCC)  Severe episode of recurrent major depressive disorder, without psychotic features (HCC)  Chronic pain due to trauma  Ambulatory dysfunction  Alcohol use disorder   Mark Hammond is a 33 yo male with a history of polysubstance use disorder (alcohol, tobacco cocaine, [employer], and cannabis), schizoaffective disorder, self-reported bipolar disorder who initially presented to the [facility] on 12/06/2023 voluntarily for complaints of suicidal ideations and requesting detox from ongoing alcohol, cocaine, and cannabis use.  Patient expressed interest in transitioning to a residential rehabilitation program and was admitted to [facility] on 12/08/2023 to complete detox.  . Medical hx of chronic left leg pain for which he was prescribed Gabapentin and Naproxen. He has been medically assessed multiple times for this leg pain.  Patient was discharged from the [facility] back in February 2025 and reports relapsing on substances shortly after discharge.  Interval history Patient evaluated at bedside this morning.  Patient is laying down in bed, reporting blurry vision this morning.  Later during the interview, he reports his blurry vision has resolved.  He reports adequate sleep and appetite.  He reports having breakfast this morning.  He reports his cravings continue to be present today.  He denies SI/HI/AVH.  Patient is interested in taking something for his ongoing depression, reports previously trialing Depakote when he was a teenager.  When discussing the risks and benefit of taking Depakote, he later declined starting this medication as it would require frequent laboratory testing for levels and  LFTs.  Psychiatric ROS Mood Symptoms 28-month history of depressed mood, anhedonia, fatigue, diminished concentration, feelings of worthlessness/hopelessness/guilt, chronic SI without intent or plan Manic Symptoms updated 3/10 Reports prior diagnosis of bipolar 1 disorder, with prior manic episodes characterized by periods of expansive energy or mood, pressured speech, racing thoughts, impulsivity.  Patient reports he experienced a manic episode during a 42-month period of sobriety while he was in jail. Anxiety Symptoms Not formally assessed Trauma Symptoms Not formally assessed Psychosis Symptoms Denied AVH, paranoia, delusional thought processes  Substance Use Hx: Alcohol: started at age 49. 1/5th and 4 40 oz, 2 16 oz, and 2 cartons a  wine daily.  [facility]ri[facility] at [facility] time.  Dneies seizures or DTs Tobacco: 3 cigarretes daily, started ta age 59 Cannabis:1 bowl of weed a day, smokes it daily - slows his usage of other, appetite Cocaine: smokes, $40 daily Methamphetamines: denies Opiates (fentanyl / heroin): oxycodone and percocets, depending on what is available to him . Reports using approximately 4 pills daily.  IVDU: denies  Past Psychiatric Hx: No current outpatient psychiatric follow-up Patient reports being previously diagnosed with bipolar disorder and depression. No current psychotropic medications Unable to recall  Past Medical History: PCP:No Medical Dx: Chronic unspecified left leg injury Allergies: codeine Trauma:Denies Seizures:Denies  Family Medical History: Unknown to pt  Family Psychiatric History: Unknown to pt  Social History: Patient lives in [place] with girlfriend Social Support: girlfriend Occupational WU:[id number] Children:denies   PHQ 2-9:  Flowsheet Row ED from 12/08/2023 in [facility] ED from 11/21/2023 in [facility]  Thoughts that you would be better off dead, or of hurting  yourself in some way More than half the days Not at all  PHQ-9 Total Score 20 8       Flowsheet Row ED from 12/08/2023 in [name] ED from 12/06/2023 in [facility] ED from 12/04/2023 in [facility] Emergency Department at [facility]  C-SSRS RISK CATEGORY No Risk No Risk High Risk       Screenings    Flowsheet Row Most Recent Value  CIWA-Ar Total 0  COWS Total Score 4       Total Time spent with patient: 1.5 hours  Psychiatric Specialty Exam  Presentation General Appearance:  Casual; Disheveled  Eye Contact: Minimal  Speech: Clear and Coherent; Normal Rate  Speech Volume: Normal  Handedness: -- (not assessed)   Mood and Affect  Mood: -- ("Sad")  Affect: Congruent; Depressed   Thought Process  Thought Processes: Linear  Descriptions of Associations:Intact  Orientation:None  Thought Content:Logical  Diagnosis of Schizophrenia or Schizoaffective disorder in past: No   Hallucinations:Hallucinations: None   Ideas of Reference:None  Suicidal Thoughts:Suicidal Thoughts: No   Homicidal Thoughts:Homicidal Thoughts: No    Sensorium  Memory: Immediate Good; Recent Good; Remote Good  Judgment: Fair  Insight: Lacking   Executive Functions  Concentration: Fair  Attention Span: Fair  Recall: Fair  Fund of Knowledge: Fair  Language: Fair   Psychomotor Activity  Psychomotor Activity: Psychomotor Activity: Normal    Assets  Assets: Desire for Improvement; Resilience; Communication Skills   Sleep  Sleep: Sleep: Good   Physical Exam Vitals and nursing note reviewed.  Constitutional:      General: He is not in acute distress.    Appearance: He is not ill-appearing.  HENT:     Head: Normocephalic and atraumatic.  Eyes:     Extraocular Movements: Extraocular movements intact.     Conjunctiva/sclera: Conjunctivae normal.  Pulmonary:     Effort:  Pulmonary effort is normal. No respiratory distress.  Skin:    General: Skin is warm and dry.  Neurological:     General: No focal deficit present.    Review of Systems  All other systems reviewed and are negative.   Blood pressure (!) 98/53, pulse 62, temperature 97.8 F (36.6 C), temperature source Oral, resp. rate 16, SpO2 99%. There is no height or weight on file to calculate BMI.    Last Labs:  Admission on 12/08/2023  Component Date Value Ref Range Status   Opiates 12/08/2023 NONE DETECTED  NONE DETECTED Final   Cocaine 12/08/2023 NONE DETECTED  NONE DETECTED Final   Benzodiazepines 12/08/2023 POSITIVE (A)  NONE DETECTED Final   Amphetamines 12/08/2023 NONE DETECTED  NONE DETECTED Final   Tetrahydrocannabinol 12/08/2023 POSITIVE (A)  NONE DETECTED Final   Barbiturates 12/08/2023 NONE DETECTED  NONE DETECTED Final   Comment: (NOTE) DRUG SCREEN FOR MEDICAL PURPOSES ONLY.  IF CONFIRMATION IS NEEDED FOR ANY PURPOSE, NOTIFY LAB WITHIN 5 DAYS.  LOWEST DETECTABLE LIMITS FOR URINE DRUG SCREEN Drug Class                     Cutoff (ng/mL) Amphetamine and metabolites    1000 Barbiturate and metabolites    200 Benzodiazepine                 200 Opiates and metabolites        300 Cocaine and metabolites        300 THC  50 Performed at [facility] Lab, [address], [place], [place] [address]   Admission on 12/06/2023, Discharged on 12/08/2023  Component Date Value Ref Range Status   Alcohol, Ethyl (B) 12/06/2023 <10  <10 mg/dL Final   Comment: (NOTE) Lowest detectable limit for serum alcohol is 10 mg/dL.  For medical purposes only. Performed at [facility], [address], [place], [place] [address]    POC Amphetamine UR 12/06/2023 None Detected  NONE DETECTED (Cut Off Level 1000 ng/mL) Final   POC Secobarbital (BAR) 12/06/2023 None Detected  NONE DETECTED (Cut Off Level 300 ng/mL) Final   POC Buprenorphine (BUP) 12/06/2023 None  Detected  NONE DETECTED (Cut Off Level 10 ng/mL) Final   POC Oxazepam (BZO) 12/06/2023 Positive (A)  NONE DETECTED (Cut Off Level 300 ng/mL) Final   POC Cocaine UR 12/06/2023 Positive (A)  NONE DETECTED (Cut Off Level 300 ng/mL) Final   POC Methamphetamine UR 12/06/2023 None Detected  NONE DETECTED (Cut Off Level 1000 ng/mL) Final   POC Morphine 12/06/2023 None Detected  NONE DETECTED (Cut Off Level 300 ng/mL) Final   POC Methadone UR 12/06/2023 None Detected  NONE DETECTED (Cut Off Level 300 ng/mL) Final   POC Oxycodone UR 12/06/2023 None Detected  NONE DETECTED (Cut Off Level 100 ng/mL) Final   POC Marijuana UR 12/06/2023 Positive (A)  NONE DETECTED (Cut Off Level 50 ng/mL) Final   WBC 12/08/2023 4.4  4.0 - 10.5 K/uL Final   RBC 12/08/2023 3.53 (L)  4.22 - 5.81 MIL/uL Final   Hemoglobin 12/08/2023 11.2 (L)  13.0 - 17.0 g/dL Final   HCT 09/81/1914 32.6 (L)  39.0 - 52.0 % Final   MCV 12/08/2023 92.4  80.0 - 100.0 fL Final   MCH 12/08/2023 31.7  26.0 - 34.0 pg Final   MCHC 12/08/2023 34.4  30.0 - 36.0 g/dL Final   RDW 78/29/5621 13.9  11.5 - 15.5 % Final   Platelets 12/08/2023 280  150 - 400 K/uL Final   nRBC 12/08/2023 0.0  0.0 - 0.2 % Final   Neutrophils Relative % 12/08/2023 59  % Final   Neutro Abs 12/08/2023 2.6  1.7 - 7.7 K/uL Final   Lymphocytes Relative 12/08/2023 27  % Final   Lymphs Abs 12/08/2023 1.2  0.7 - 4.0 K/uL Final   Monocytes Relative 12/08/2023 9  % Final   Monocytes Absolute 12/08/2023 0.4  0.1 - 1.0 K/uL Final   Eosinophils Relative 12/08/2023 3  % Final   Eosinophils Absolute 12/08/2023 0.1  0.0 - 0.5 K/uL Final   Basophils Relative 12/08/2023 1  % Final   Basophils Absolute 12/08/2023 0.1  0.0 - 0.1 K/uL Final   Immature Granulocytes 12/08/2023 1  % Final   Abs Immature Granulocytes 12/08/2023 0.02  0.00 - 0.07 K/uL Final   Performed at [facility] Lab, [address], [place], [place] [address]   Sodium 12/08/2023 139  135 - 145 mmol/L Final   Potassium  12/08/2023 4.0  3.5 - 5.1 mmol/L Final   Chloride 12/08/2023 100  98 - 111 mmol/L Final   CO2 12/08/2023 27  22 - 32 mmol/L Final   Glucose, Bld 12/08/2023 114 (H)  70 - 99 mg/dL Final   Glucose reference range applies only to samples taken after fasting for at least 8 hours.   BUN 12/08/2023 9  6 - 20 mg/dL Final   Creatinine, Ser 12/08/2023 0.60 (L)  0.61 - 1.24 mg/dL Final   Calcium  12/08/2023 8.7 (L)  8.9 - 10.3 mg/dL Final   Total Protein 21/30/8657 6.2 (L)  6.5 - 8.1 g/dL Final   Albumin 84/69/6295 2.3 (L)  3.5 - 5.0 g/dL Final   AST 28/41/3244 101 (H)  15 - 41 U/L Final   ALT 12/08/2023 55 (H)  0 - 44 U/L Final   Alkaline Phosphatase 12/08/2023 125  38 - 126 U/L Final   Total Bilirubin 12/08/2023 0.4  0.0 - 1.2 mg/dL Final   GFR, Estimated 12/08/2023 >60  >60 mL/min Final   Comment: (NOTE) Calculated using the CKD-EPI Creatinine Equation (2021)    Anion gap 12/08/2023 12  5 - 15 Final   Performed at [name], [address] [place], [place] [address]   Cholesterol 12/08/2023 96  0 - 200 mg/dL Final   Triglycerides 25/36/6440 55  <150 mg/dL Final   HDL 34/74/2595 34 (L)  >40 mg/dL Final   Total CHOL/HDL Ratio 12/08/2023 2.8  RATIO Final   VLDL 12/08/2023 11  0 - 40 mg/dL Final   LDL Cholesterol 12/08/2023 51  0 - 99 mg/dL Final   Comment:        Total Cholesterol/HDL:CHD Risk Coronary Heart Disease Risk Table                     Men   Women  1/2 Average Risk   3.4   3.3  Average Risk       5.0   4.4  2 X Average Risk   9.6   7.1  3 X Average Risk  23.4   11.0        Use the calculated Patient Ratio above and the CHD Risk Table to determine the patient's CHD Risk.        ATP III CLASSIFICATION (LDL):  <100     mg/dL   Optimal  638-756  mg/dL   Near or Above                    Optimal  130-159  mg/dL   Borderline  433-295  mg/dL   High  >188     mg/dL   Very High Performed at [facility], [address] [place], [place] [address]    Magnesium  12/08/2023 1.7  1.7 - 2.4 mg/dL Final   Performed at [facility] Lab, [address] [place], [place] [address]   TSH 12/08/2023 0.714  0.350 - 4.500 uIU/mL Final   Comment: Performed by a 3rd Generation assay with a functional sensitivity of <=0.01 uIU/mL. Performed at [facility], [address] [place], [place] [address]    Hgb A1c MFr Bld 12/08/2023 6.0 (H)  4.8 - 5.6 % Final   Comment: (NOTE) Pre diabetes:          5.7%-6.4%  Diabetes:              >6.4%  Glycemic control for   <7.0% adults with diabetes    Mean Plasma Glucose 12/08/2023 125.5  mg/dL Final   Performed at [facility], [address], [place], [place] [address]  Admission on 12/04/2023, Discharged on 12/04/2023  Component Date Value Ref Range Status   Color, Urine 12/04/2023 YELLOW  YELLOW Final   APPearance 12/04/2023 CLEAR  CLEAR Final   Specific Gravity, Urine 12/04/2023 1.029  1.005 - 1.030 Final   pH 12/04/2023 5.0  5.0 - 8.0 Final   Glucose, UA  12/04/2023 NEGATIVE  NEGATIVE mg/dL Final   Hgb urine dipstick 12/04/2023 NEGATIVE  NEGATIVE Final   Bilirubin Urine 12/04/2023 NEGATIVE  NEGATIVE Final   Ketones, ur 12/04/2023 NEGATIVE  NEGATIVE mg/dL Final   Protein, ur 16/07/9603 NEGATIVE  NEGATIVE mg/dL Final   Nitrite 54/06/8118 NEGATIVE  NEGATIVE Final   Leukocytes,Ua 12/04/2023 NEGATIVE  NEGATIVE Final   Performed at [facility], [address], [place], [place] [address]  Admission on 11/27/2023, Discharged on 11/28/2023  Component Date Value Ref Range Status   WBC 11/27/2023 13.5 (H)  4.0 - 10.5 K/uL Final   RBC 11/27/2023 4.12 (L)  4.22 - 5.81 MIL/uL Final   Hemoglobin 11/27/2023 12.8 (L)  13.0 - 17.0 g/dL Final   HCT 95/62/1308 38.4 (L)  39.0 - 52.0 % Final   MCV 11/27/2023 93.2  80.0 - 100.0 fL Final   MCH 11/27/2023 31.1  26.0 - 34.0 pg Final   MCHC 11/27/2023 33.3  30.0 - 36.0 g/dL Final   RDW 65/78/4696 13.4  11.5 - 15.5 % Final   Platelets 11/27/2023 375   150 - 400 K/uL Final   nRBC 11/27/2023 0.0  0.0 - 0.2 % Final   Performed at [facility] Lab, [address], [place], [place] [address]   Sodium 11/27/2023 132 (L)  135 - 145 mmol/L Final   Potassium 11/27/2023 4.3  3.5 - 5.1 mmol/L Final   Chloride 11/27/2023 95 (L)  98 - 111 mmol/L Final   CO2 11/27/2023 21 (L)  22 - 32 mmol/L Final   Glucose, Bld 11/27/2023 113 (H)  70 - 99 mg/dL Final   Glucose reference range applies only to samples taken after fasting for at least 8 hours.   BUN 11/27/2023 15  6 - 20 mg/dL Final   Creatinine, Ser 11/27/2023 0.86  0.61 - 1.24 mg/dL Final   Calcium 41/32/4401 9.5  8.9 - 10.3 mg/dL Final   GFR, Estimated 11/27/2023 >60  >60 mL/min Final   Comment: (NOTE) Calculated using the CKD-EPI Creatinine Equation (2021)    Anion gap 11/27/2023 16 (H)  5 - 15 Final   Performed at [facility], [address] [place], [place] [address]   Salicylate Lvl 11/27/2023 <7.0 (L)  7.0 - 30.0 mg/dL Final   Performed at [facility], [address] [place], [place] [address]   Acetaminophen (Tylenol), Serum 11/27/2023 10  10 - 30 ug/mL Final   Comment: (NOTE) Therapeutic concentrations vary significantly. A range of 10-30 ug/mL  may be an effective concentration for many patients. However, some  are best treated at concentrations outside of this range. Acetaminophen concentrations >150 ug/mL at 4 hours after ingestion  and >50 ug/mL at 12 hours after ingestion are often associated with  toxic reactions.  Performed at [facility] Lab, [address], [place], [place] [address]    Alcohol, Ethyl (B) 11/27/2023 127 (H)  <10 mg/dL Final   Comment: (NOTE) Lowest detectable limit for serum alcohol is 10 mg/dL.  For medical purposes only. Performed at [facility], [address], [place], [place] [address]    Opiates 11/27/2023 POSITIVE (A)  NONE DETECTED Final   Cocaine 11/27/2023 POSITIVE (A)  NONE DETECTED Final   Benzodiazepines  11/27/2023 POSITIVE (A)  NONE DETECTED Final   Amphetamines 11/27/2023 NONE DETECTED  NONE DETECTED Final   Tetrahydrocannabinol 11/27/2023 POSITIVE (A)  NONE DETECTED Final   Barbiturates 11/27/2023 NONE DETECTED  NONE DETECTED Final  Comment: (NOTE) DRUG SCREEN FOR MEDICAL PURPOSES ONLY.  IF CONFIRMATION IS NEEDED FOR ANY PURPOSE, NOTIFY LAB WITHIN 5 DAYS.  LOWEST DETECTABLE LIMITS FOR URINE DRUG SCREEN Drug Class                     Cutoff (ng/mL) Amphetamine and metabolites    1000 Barbiturate and metabolites    200 Benzodiazepine                 200 Opiates and metabolites        300 Cocaine and metabolites        300 THC                            50 Performed at [facility], [address], [place], [place] [address]    Color, Urine 11/27/2023 AMBER (A)  YELLOW Final   BIOCHEMICALS MAY BE AFFECTED BY COLOR   APPearance 11/27/2023 CLEAR  CLEAR Final   Specific Gravity, Urine 11/27/2023 1.020  1.005 - 1.030 Final   pH 11/27/2023 5.0  5.0 - 8.0 Final   Glucose, UA 11/27/2023 NEGATIVE  NEGATIVE mg/dL Final   Hgb urine dipstick 11/27/2023 NEGATIVE  NEGATIVE Final   Bilirubin Urine 11/27/2023 NEGATIVE  NEGATIVE Final   Ketones, ur 11/27/2023 NEGATIVE  NEGATIVE mg/dL Final   Protein, ur 08/65/7846 NEGATIVE  NEGATIVE mg/dL Final   Nitrite 96/29/5284 NEGATIVE  NEGATIVE Final   Leukocytes,Ua 11/27/2023 NEGATIVE  NEGATIVE Final   Performed at [facility], [address] [place], [place] [address]   Neisseria Gonorrhea 11/27/2023 Negative   Final   Chlamydia 11/27/2023 Negative   Final   Comment 11/27/2023 Normal Reference Ranger Chlamydia - Negative   Final   Comment 11/27/2023 Normal Reference Range Neisseria Gonorrhea - Negative   Final  Admission on 11/21/2023, Discharged on 11/27/2023  Component Date Value Ref Range Status   SARS Coronavirus 2 by RT PCR 11/25/2023 NEGATIVE  NEGATIVE Final   Performed at [facility] Lab, [address] [place], [place]  [address]   WBC 11/21/2023 5.6  4.0 - 10.5 K/uL Final   RBC 11/21/2023 4.14 (L)  4.22 - 5.81 MIL/uL Final   Hemoglobin 11/21/2023 13.1  13.0 - 17.0 g/dL Final   HCT 25/36/6440 38.8 (L)  39.0 - 52.0 % Final   MCV 11/21/2023 93.7  80.0 - 100.0 fL Final   MCH 11/21/2023 31.6  26.0 - 34.0 pg Final   MCHC 11/21/2023 33.8  30.0 - 36.0 g/dL Final   RDW 34/74/2595 13.2  11.5 - 15.5 % Final   Platelets 11/21/2023 298  150 - 400 K/uL Final   nRBC 11/21/2023 0.0  0.0 - 0.2 % Final   Neutrophils Relative % 11/21/2023 83  % Final   Neutro Abs 11/21/2023 4.6  1.7 - 7.7 K/uL Final   Lymphocytes Relative 11/21/2023 14  % Final   Lymphs Abs 11/21/2023 0.8  0.7 - 4.0 K/uL Final   Monocytes Relative 11/21/2023 3  % Final   Monocytes Absolute 11/21/2023 0.2  0.1 - 1.0 K/uL Final   Eosinophils Relative 11/21/2023 0  % Final   Eosinophils Absolute 11/21/2023 0.0  0.0 - 0.5 K/uL Final   Basophils Relative 11/21/2023 0  % Final   Basophils Absolute 11/21/2023 0.0  0.0 - 0.1 K/uL Final   Immature Granulocytes 11/21/2023 0  % Final   Abs Immature Granulocytes 11/21/2023 0.01  0.00 - 0.07 K/uL Final   Performed at [facility], [address], [place], [place] [address]   Sodium 11/21/2023 135  135 - 145 mmol/L Final   Potassium 11/21/2023 4.2  3.5 - 5.1 mmol/L Final   Chloride 11/21/2023 99  98 - 111 mmol/L Final   CO2 11/21/2023 26  22 - 32 mmol/L Final   Glucose, Bld 11/21/2023 162 (H)  70 - 99 mg/dL Final   Glucose reference range applies only to samples taken after fasting for at least 8 hours.   BUN 11/21/2023 7  6 - 20 mg/dL Final   Creatinine, Ser 11/21/2023 0.63  0.61 - 1.24 mg/dL Final   Calcium 96/29/5284 8.9  8.9 - 10.3 mg/dL Final   Total Protein 13/24/4010 8.1  6.5 - 8.1 g/dL Final   Albumin 27/25/3664 2.8 (L)  3.5 - 5.0 g/dL Final   AST 40/34/7425 91 (H)  15 - 41 U/L Final   ALT 11/21/2023 55 (H)  0 - 44 U/L Final   Alkaline Phosphatase 11/21/2023 87  38 - 126 U/L Final   Total Bilirubin  11/21/2023 0.8  0.0 - 1.2 mg/dL Final   GFR, Estimated 11/21/2023 >60  >60 mL/min Final   Comment: (NOTE) Calculated using the CKD-EPI Creatinine Equation (2021)    Anion gap 11/21/2023 10  5 - 15 Final   Performed at [facility] Lab, [address], [place], [place] [address]   Alcohol, Ethyl (B) 11/21/2023 <10  <10 mg/dL Final   Comment: (NOTE) Lowest detectable limit for serum alcohol is 10 mg/dL.  For medical purposes only. Performed at [facility], [address] [place], [place] [address]    TSH 11/21/2023 0.365  0.350 - 4.500 uIU/mL Final   Comment: Performed by a 3rd Generation assay with a functional sensitivity of <=0.01 uIU/mL. Performed at [facility] Lab, [address], [place], [place] [address]    POC Amphetamine UR 11/22/2023 None Detected  NONE DETECTED (Cut Off Level 1000 ng/mL) Final   POC Secobarbital (BAR) 11/22/2023 None Detected  NONE DETECTED (Cut Off Level 300 ng/mL) Final   POC Buprenorphine (BUP) 11/22/2023 None Detected  NONE DETECTED (Cut Off Level 10 ng/mL) Final   POC Oxazepam (BZO) 11/22/2023 Positive (A)  NONE DETECTED (Cut Off Level 300 ng/mL) Final   POC Cocaine UR 11/22/2023 Positive (A)  NONE DETECTED (Cut Off Level 300 ng/mL) Final   POC Methamphetamine UR 11/22/2023 None Detected  NONE DETECTED (Cut Off Level 1000 ng/mL) Final   POC Morphine 11/22/2023 None Detected  NONE DETECTED (Cut Off Level 300 ng/mL) Final   POC Methadone UR 11/22/2023 None Detected  NONE DETECTED (Cut Off Level 300 ng/mL) Final   POC Oxycodone UR 11/22/2023 None Detected  NONE DETECTED (Cut Off Level 100 ng/mL) Final   POC Marijuana UR 11/22/2023 Positive (A)  NONE DETECTED (Cut Off Level 50 ng/mL) Final   Hgb A1c MFr Bld 11/25/2023 5.9 (H)  4.8 - 5.6 % Final   Comment: (NOTE) Pre diabetes:          5.7%-6.4%  Diabetes:              >6.4%  Glycemic control for   <7.0% adults with diabetes    Mean Plasma Glucose 11/25/2023 122.63  mg/dL Final    Performed at [facility] Lab, [address], [place], [place] [address]   Sodium 11/25/2023 136  135 - 145 mmol/L Final   Potassium  11/25/2023 4.2  3.5 - 5.1 mmol/L Final   Chloride 11/25/2023 101  98 - 111 mmol/L Final   CO2 11/25/2023 27  22 - 32 mmol/L Final   Glucose, Bld 11/25/2023 108 (H)  70 - 99 mg/dL Final   Glucose reference range applies only to samples taken after fasting for at least 8 hours.   BUN 11/25/2023 9  6 - 20 mg/dL Final   Creatinine, Ser 11/25/2023 0.68  0.61 - 1.24 mg/dL Final   Calcium 08/65/7846 8.6 (L)  8.9 - 10.3 mg/dL Final   Total Protein 96/29/5284 7.0  6.5 - 8.1 g/dL Final   Albumin 13/24/4010 2.3 (L)  3.5 - 5.0 g/dL Final   AST 27/25/3664 81 (H)  15 - 41 U/L Final   ALT 11/25/2023 58 (H)  0 - 44 U/L Final   Alkaline Phosphatase 11/25/2023 84  38 - 126 U/L Final   Total Bilirubin 11/25/2023 0.7  0.0 - 1.2 mg/dL Final   GFR, Estimated 11/25/2023 >60  >60 mL/min Final   Comment: (NOTE) Calculated using the CKD-EPI Creatinine Equation (2021)    Anion gap 11/25/2023 8  5 - 15 Final   Performed at [facility] Lab, [address] [place], [place] [address]   Iron 11/25/2023 21 (L)  45 - 182 ug/dL Final   TIBC 42/59/5638 280  250 - 450 ug/dL Final   Saturation Ratios 11/25/2023 8 (L)  17.9 - 39.5 % Final   UIBC 11/25/2023 259  ug/dL Final   Performed at [facility] Lab, [address] [place], [place] [address]   Vitamin B-12 11/25/2023 549  180 - 914 pg/mL Final   Comment: (NOTE) This assay is not validated for testing neonatal or myeloproliferative syndrome specimens for Vitamin B12 levels. Performed at [facility] Lab, [address] [place], [place] [address]    Ferritin 11/25/2023 217  24 - 336 ng/mL Final   Performed at [facility] Lab, [address], [place], [place] [address]  Admission on 11/19/2023, Discharged on 11/20/2023  Component Date Value Ref Range Status   Sodium 11/19/2023 138  135 - 145 mmol/L Final   Potassium  11/19/2023 3.4 (L)  3.5 - 5.1 mmol/L Final   Chloride 11/19/2023 103  98 - 111 mmol/L Final   CO2 11/19/2023 26  22 - 32 mmol/L Final   Glucose, Bld 11/19/2023 123 (H)  70 - 99 mg/dL Final   Glucose reference range applies only to samples taken after fasting for at least 8 hours.   BUN 11/19/2023 <5 (L)  6 - 20 mg/dL Final   Creatinine, Ser 11/19/2023 0.50 (L)  0.61 - 1.24 mg/dL Final   Calcium 60/63/0160 8.2 (L)  8.9 - 10.3 mg/dL Final   GFR, Estimated 11/19/2023 >60  >60 mL/min Final   Comment: (NOTE) Calculated using the CKD-EPI Creatinine Equation (2021)    Anion gap 11/19/2023 9  5 - 15 Final   Performed at [facility], [address], [place], [place] [address]   WBC 11/19/2023 8.4  4.0 - 10.5 K/uL Final   RBC 11/19/2023 4.00 (L)  4.22 - 5.81 MIL/uL Final   Hemoglobin 11/19/2023 12.6 (L)  13.0 - 17.0 g/dL Final   HCT 35/57/3220 37.7 (L)  39.0 - 52.0 % Final   MCV 11/19/2023 94.3  80.0 - 100.0 fL Final   MCH 11/19/2023 31.5  26.0 - 34.0 pg Final   MCHC 11/19/2023 33.4  30.0 - 36.0 g/dL  Final   RDW 11/19/2023 13.2  11.5 - 15.5 % Final   Platelets 11/19/2023 267  150 - 400 K/uL Final   nRBC 11/19/2023 0.0  0.0 - 0.2 % Final   Neutrophils Relative % 11/19/2023 65  % Final   Neutro Abs 11/19/2023 5.5  1.7 - 7.7 K/uL Final   Lymphocytes Relative 11/19/2023 22  % Final   Lymphs Abs 11/19/2023 1.9  0.7 - 4.0 K/uL Final   Monocytes Relative 11/19/2023 11  % Final   Monocytes Absolute 11/19/2023 0.9  0.1 - 1.0 K/uL Final   Eosinophils Relative 11/19/2023 1  % Final   Eosinophils Absolute 11/19/2023 0.1  0.0 - 0.5 K/uL Final   Basophils Relative 11/19/2023 1  % Final   Basophils Absolute 11/19/2023 0.1  0.0 - 0.1 K/uL Final   Immature Granulocytes 11/19/2023 0  % Final   Abs Immature Granulocytes 11/19/2023 0.03  0.00 - 0.07 K/uL Final   Performed at [facility], [address], [place], [place] [address]   Sed Rate 11/19/2023 77 (H)  0 - 16  mm/hr Final   Performed at [facility], [address], [place], [place] [address]   CRP 11/19/2023 4.2 (H)  <1.0 mg/dL Final   Performed at [facility], [address], [place], [place] [address]   Sodium 11/19/2023 141  135 - 145 mmol/L Final   Potassium 11/19/2023 3.5  3.5 - 5.1 mmol/L Final   Chloride 11/19/2023 101  98 - 111 mmol/L Final   BUN 11/19/2023 <3 (L)  6 - 20 mg/dL Final   Creatinine, Ser 11/19/2023 0.70  0.61 - 1.24 mg/dL Final   Glucose, Bld 13/05/6577 126 (H)  70 - 99 mg/dL Final   Glucose reference range applies only to samples taken after fasting for at least 8 hours.   Calcium, Ion 11/19/2023 1.05 (L)  1.15 - 1.40 mmol/L Final   TCO2 11/19/2023 27  22 - 32 mmol/L Final   Hemoglobin 11/19/2023 12.9 (L)  13.0 - 17.0 g/dL Final   HCT 46/96/2952 38.0 (L)  39.0 - 52.0 % Final   SARS Coronavirus 2 by RT PCR 11/19/2023 NEGATIVE  NEGATIVE Final   Comment: (NOTE) SARS-CoV-2 target nucleic acids are NOT DETECTED.  The SARS-CoV-2 RNA is generally detectable in upper respiratory specimens during the acute phase of infection. The lowest concentration of SARS-CoV-2 viral copies this assay can detect is 138 copies/mL. A negative result does not preclude SARS-Cov-2 infection and should not be used as the sole basis for treatment or other patient management decisions. A negative result may occur with  improper specimen collection/handling, submission of specimen other than nasopharyngeal swab, presence of viral mutation(s) within the areas targeted by this assay, and inadequate number of viral copies(<138 copies/mL). A negative result must be combined with clinical observations, patient history, and epidemiological information. The expected result is Negative.  Fact Sheet for Patients:  BloggerCourse.com  Fact Sheet for Healthcare Providers:  SeriousBroker.it  This test is no                           t yet approved or cleared by the [place] FDA and  has been authorized for detection and/or diagnosis of SARS-CoV-2 by FDA under an Emergency Use Authorization (EUA). This EUA will remain  in effect (meaning this test can be used) for the duration of the COVID-19 declaration under Section 564(b)(1) of the Act,  21 U.S.C.section 360bbb-3(b)(1), unless the authorization is terminated  or revoked sooner.       Influenza A by PCR 11/19/2023 NEGATIVE  NEGATIVE Final   Influenza B by PCR 11/19/2023 NEGATIVE  NEGATIVE Final   Comment: (NOTE) The Xpert Xpress SARS-CoV-2/FLU/RSV plus assay is intended as an aid in the diagnosis of influenza from Nasopharyngeal swab specimens and should not be used as a sole basis for treatment. Nasal washings and aspirates are unacceptable for Xpert Xpress SARS-CoV-2/FLU/RSV testing.  Fact Sheet for Patients: BloggerCourse.com  Fact Sheet for Healthcare Providers: SeriousBroker.it  This test is not yet approved or cleared by the [place] FDA and has been authorized for detection and/or diagnosis of SARS-CoV-2 by FDA under an Emergency Use Authorization (EUA). This EUA will remain in effect (meaning this test can be used) for the duration of the COVID-19 declaration under Section 564(b)(1) of the Act, 21 U.S.C. section 360bbb-3(b)(1), unless the authorization is terminated or revoked.     Resp Syncytial Virus by PCR 11/19/2023 NEGATIVE  NEGATIVE Final   Comment: (NOTE) Fact Sheet for Patients: BloggerCourse.com  Fact Sheet for Healthcare Providers: SeriousBroker.it  This test is not yet approved or cleared by the [place] FDA and has been authorized for detection and/or diagnosis of SARS-CoV-2 by FDA under an Emergency Use Authorization (EUA). This EUA will remain in effect (meaning this test can be used) for the duration of  the COVID-19 declaration under Section 564(b)(1) of the Act, 21 U.S.C. section 360bbb-3(b)(1), unless the authorization is terminated or revoked.  Performed at [facility] [address] [place], [place] [address]   Admission on 11/09/2023, Discharged on 11/09/2023  Component Date Value Ref Range Status   Color, Urine 11/09/2023 AMBER (A)  YELLOW Final   BIOCHEMICALS MAY BE AFFECTED BY COLOR   APPearance 11/09/2023 CLEAR  CLEAR Final   Specific Gravity, Urine 11/09/2023 1.036 (H)  1.005 - 1.030 Final   pH 11/09/2023 5.0  5.0 - 8.0 Final   Glucose, UA 11/09/2023 NEGATIVE  NEGATIVE mg/dL Final   Hgb urine dipstick 11/09/2023 NEGATIVE  NEGATIVE Final   Bilirubin Urine 11/09/2023 SMALL (A)  NEGATIVE Final   Ketones, ur 11/09/2023 NEGATIVE  NEGATIVE mg/dL Final   Protein, ur 91/47/8295 30 (A)  NEGATIVE mg/dL Final   Nitrite 62/13/0865 NEGATIVE  NEGATIVE Final   Leukocytes,Ua 11/09/2023 NEGATIVE  NEGATIVE Final   RBC / HPF 11/09/2023 0-5  0 - 5 RBC/hpf Final   WBC, UA 11/09/2023 0-5  0 - 5 WBC/hpf Final   Bacteria, UA 11/09/2023 NONE SEEN  NONE SEEN Final   Squamous Epithelial / HPF 11/09/2023 0-5  0 - 5 /HPF Final   Mucus 11/09/2023 PRESENT   Final   Performed at [facility], [address], [place], [place] [address]   Opiates 11/09/2023 POSITIVE (A)  NONE DETECTED Final   Cocaine 11/09/2023 POSITIVE (A)  NONE DETECTED Final   Benzodiazepines 11/09/2023 POSITIVE (A)  NONE DETECTED Final   Amphetamines 11/09/2023 NONE DETECTED  NONE DETECTED Final   Tetrahydrocannabinol 11/09/2023 POSITIVE (A)  NONE DETECTED Final   Barbiturates 11/09/2023 NONE DETECTED  NONE DETECTED Final   Comment: (NOTE) DRUG SCREEN FOR MEDICAL PURPOSES ONLY.  IF CONFIRMATION IS NEEDED FOR ANY PURPOSE, NOTIFY LAB WITHIN 5 DAYS.  LOWEST DETECTABLE LIMITS FOR URINE DRUG SCREEN Drug Class                     Cutoff (ng/mL) Amphetamine and metabolites  1000 Barbiturate and metabolites    200 Benzodiazepine                 200 Opiates and metabolites        300 Cocaine and metabolites        300 THC                            50 Performed at [facility], [address] [place], [place] [address]   Admission on 08/10/2023, Discharged on 08/14/2023  Component Date Value Ref Range Status   Sodium 08/10/2023 137  135 - 145 mmol/L Final   Potassium 08/10/2023 3.4 (L)  3.5 - 5.1 mmol/L Final   Chloride 08/10/2023 100  98 - 111 mmol/L Final   CO2 08/10/2023 25  22 - 32 mmol/L Final   Glucose, Bld 08/10/2023 109 (H)  70 - 99 mg/dL Final   Glucose reference range applies only to samples taken after fasting for at least 8 hours.   BUN 08/10/2023 11  6 - 20 mg/dL Final   Creatinine, Ser 08/10/2023 0.71  0.61 - 1.24 mg/dL Final   Calcium 91/47/8295 8.8 (L)  8.9 - 10.3 mg/dL Final   Total Protein 62/13/0865 7.9  6.5 - 8.1 g/dL Final   Albumin 78/46/9629 3.8  3.5 - 5.0 g/dL Final   AST 52/84/1324 466 (H)  15 - 41 U/L Final   ALT 08/10/2023 264 (H)  0 - 44 U/L Final   Alkaline Phosphatase 08/10/2023 112  38 - 126 U/L Final   Total Bilirubin 08/10/2023 1.2 (H)  <1.2 mg/dL Final   GFR, Estimated 08/10/2023 >60  >60 mL/min Final   Comment: (NOTE) Calculated using the CKD-EPI Creatinine Equation (2021)    Anion gap 08/10/2023 12  5 - 15 Final   Performed at [facility], [address], [place], [place] [address]   Alcohol, Ethyl (B) 08/10/2023 144 (H)  <10 mg/dL Final   Comment: (NOTE) Lowest detectable limit for serum alcohol is 10 mg/dL.  For medical purposes only. Performed at [facility], [address], [place], [place] [address]    Salicylate Lvl 08/10/2023 <7.0 (L)  7.0 - 30.0 mg/dL Final   Performed at [facility], [address], [place], [place] [address]   Acetaminophen (Tylenol), Serum 08/10/2023 <10 (L)  10 - 30 ug/mL Final   Comment:  (NOTE) Therapeutic concentrations vary significantly. A range of 10-30 ug/mL  may be an effective concentration for many patients. However, some  are best treated at concentrations outside of this range. Acetaminophen concentrations >150 ug/mL at 4 hours after ingestion  and >50 ug/mL at 12 hours after ingestion are often associated with  toxic reactions.  Performed at [facility], [address] [place], [place] [address]    WBC 08/10/2023 8.0  4.0 - 10.5 K/uL Final   RBC 08/10/2023 4.13 (L)  4.22 - 5.81 MIL/uL Final   Hemoglobin 08/10/2023 13.8  13.0 - 17.0 g/dL Final   HCT 95/63/8756 39.4  39.0 - 52.0 % Final   MCV 08/10/2023 95.4  80.0 - 100.0 fL Final   MCH 08/10/2023 33.4  26.0 - 34.0 pg Final   MCHC 08/10/2023 35.0  30.0 - 36.0 g/dL Final   RDW 43/32/9518 13.1  11.5 - 15.5 % Final   Platelets 08/10/2023 226  150 - 400 K/uL Final   nRBC 08/10/2023 0.0  0.0 - 0.2 % Final   Performed at [facility], [address] [place], [place] [address]   Opiates 08/10/2023 NONE DETECTED  NONE DETECTED Final   Cocaine 08/10/2023 POSITIVE (A)  NONE DETECTED Final   Benzodiazepines 08/10/2023 NONE DETECTED  NONE DETECTED Final   Amphetamines 08/10/2023 POSITIVE (A)  NONE DETECTED Final   Tetrahydrocannabinol 08/10/2023 POSITIVE (A)  NONE DETECTED Final   Barbiturates 08/10/2023 NONE DETECTED  NONE DETECTED Final   Comment: (NOTE) DRUG SCREEN FOR MEDICAL PURPOSES ONLY.  IF CONFIRMATION IS NEEDED FOR ANY PURPOSE, NOTIFY LAB WITHIN 5 DAYS.  LOWEST DETECTABLE LIMITS FOR URINE DRUG SCREEN Drug Class                     Cutoff (ng/mL) Amphetamine and metabolites    1000 Barbiturate and metabolites    200 Benzodiazepine                 200 Opiates and metabolites        300 Cocaine and metabolites        300 THC                            50 Performed at [facility], [address] [place], [place] [address]    Total CK  08/10/2023 461 (H)  49 - 397 U/L Final   Performed at [facility], [address], [place], [place] [address]   Hepatitis B Surface Ag 08/11/2023 NON REACTIVE  NON REACTIVE Final   HCV Ab 08/11/2023 Reactive (A)  NON REACTIVE Final   Comment: (NOTE) The CDC recommends that a Reactive HCV antibody result be followed up  with a HCV Nucleic Acid Amplification test.     Hep A IgM 08/11/2023 NON REACTIVE  NON REACTIVE Final   Hep B C IgM 08/11/2023 NON REACTIVE  NON REACTIVE Final   Performed at [facility], [address] [place], [place] [address]   Total Protein 08/12/2023 7.4  6.5 - 8.1 g/dL Final   Albumin 40/34/7425 3.2 (L)  3.5 - 5.0 g/dL Final   AST 95/63/8756 381 (H)  15 - 41 U/L Final   ALT 08/12/2023 247 (H)  0 - 44 U/L Final   Alkaline Phosphatase 08/12/2023 97  38 - 126 U/L Final   Total Bilirubin 08/12/2023 1.0  <1.2 mg/dL Final   Bilirubin, Direct 08/12/2023 0.2  0.0 - 0.2 mg/dL Final   Indirect Bilirubin 08/12/2023 0.8  0.3 - 0.9 mg/dL Final   Performed at [facility], [address], [place], [place] [address]   Total CK 08/12/2023 41 (L)  49 - 397 U/L Final   Performed at [facility], [address] [place], [place] [address]   Ammonia 08/12/2023 48 (H)  9 - 35 umol/L Final   Performed at [facility], [address] [place], [place] [address]  Admission on 08/04/2023, Discharged on 08/07/2023  Component Date Value Ref Range Status   Amphetamines, Urine 08/04/2023 Negative  Cutoff=1000 ng/mL Final   Amphetamine test includes Amphetamine and Methamphetamine.   Barbiturate, Ur 08/04/2023 Negative  Cutoff=300 ng/mL Final   Benzodiazepine Quant, Ur 08/04/2023 Negative  Cutoff=300 ng/mL Final   Cannabinoid Quant, Ur 08/04/2023 Negative  Cutoff=50 ng/mL Final   Cocaine (Metab.) 08/04/2023 See Final Results  Cutoff=300 ng/mL Final   Opiate Quant, Ur 08/04/2023 Negative  Cutoff=300  ng/mL  Final   Opiate test includes Codeine and Morphine only.   Phencyclidine, Ur 08/04/2023 Negative  Cutoff=25 ng/mL Final   Methadone Screen, Urine 08/04/2023 Negative  Cutoff=300 ng/mL Final   Propoxyphene, Urine 08/04/2023 Negative  Cutoff=300 ng/mL Final   Ethanol U, Cloretta Ned 08/04/2023 See Final Results  Cutoff=0.020 % Final   Comment: (NOTE) Performed At: [facility] [address] [place], [place] [id number] Avis Epley PhD [id number]    WBC 08/04/2023 5.8  4.0 - 10.5 K/uL Final   RBC 08/04/2023 3.93 (L)  4.22 - 5.81 MIL/uL Final   Hemoglobin 08/04/2023 12.9 (L)  13.0 - 17.0 g/dL Final   HCT 95/62/1308 36.4 (L)  39.0 - 52.0 % Final   MCV 08/04/2023 92.6  80.0 - 100.0 fL Final   MCH 08/04/2023 32.8  26.0 - 34.0 pg Final   MCHC 08/04/2023 35.4  30.0 - 36.0 g/dL Final   RDW 65/78/4696 12.7  11.5 - 15.5 % Final   Platelets 08/04/2023 147 (L)  150 - 400 K/uL Final   Comment: SPECIMEN CHECKED FOR CLOTS REPEATED TO VERIFY PLATELET COUNT CONFIRMED BY SMEAR    nRBC 08/04/2023 0.0  0.0 - 0.2 % Final   Neutrophils Relative % 08/04/2023 76  % Final   Neutro Abs 08/04/2023 4.4  1.7 - 7.7 K/uL Final   Lymphocytes Relative 08/04/2023 16  % Final   Lymphs Abs 08/04/2023 0.9  0.7 - 4.0 K/uL Final   Monocytes Relative 08/04/2023 6  % Final   Monocytes Absolute 08/04/2023 0.3  0.1 - 1.0 K/uL Final   Eosinophils Relative 08/04/2023 0  % Final   Eosinophils Absolute 08/04/2023 0.0  0.0 - 0.5 K/uL Final   Basophils Relative 08/04/2023 1  % Final   Basophils Absolute 08/04/2023 0.0  0.0 - 0.1 K/uL Final   Immature Granulocytes 08/04/2023 1  % Final   Abs Immature Granulocytes 08/04/2023 0.04  0.00 - 0.07 K/uL Final   Performed at [facility], [address] [place], [place] [address]   Sodium 08/04/2023 134 (L)  135 - 145 mmol/L Final   Potassium 08/04/2023 3.0 (L)  3.5 - 5.1 mmol/L Final   Chloride 08/04/2023 95 (L)  98 - 111 mmol/L Final   CO2 08/04/2023 24  22 - 32 mmol/L Final    Glucose, Bld 08/04/2023 115 (H)  70 - 99 mg/dL Final   Glucose reference range applies only to samples taken after fasting for at least 8 hours.   BUN 08/04/2023 5 (L)  6 - 20 mg/dL Final   Creatinine, Ser 08/04/2023 0.56 (L)  0.61 - 1.24 mg/dL Final   Calcium 41/32/4401 8.2 (L)  8.9 - 10.3 mg/dL Final   Total Protein 02/72/5366 7.7  6.5 - 8.1 g/dL Final   Albumin 44/12/4740 3.6  3.5 - 5.0 g/dL Final   AST 59/56/3875 205 (H)  15 - 41 U/L Final   ALT 08/04/2023 99 (H)  0 - 44 U/L Final   Alkaline Phosphatase 08/04/2023 98  38 - 126 U/L Final   Total Bilirubin 08/04/2023 1.0  0.3 - 1.2 mg/dL Final   GFR, Estimated 08/04/2023 >60  >60 mL/min Final   Comment: (NOTE) Calculated using the CKD-EPI Creatinine Equation (2021)    Anion gap 08/04/2023 15  5 - 15 Final   Performed at [facility], [address], [place], [place] [address]   Hgb A1c MFr Bld 08/04/2023 5.4  4.8 - 5.6 % Final  Comment: (NOTE) Pre diabetes:          5.7%-6.4%  Diabetes:              >6.4%  Glycemic control for   <7.0% adults with diabetes    Mean Plasma Glucose 08/04/2023 108.28  mg/dL Final   Performed at [facility], [address] [place], [place] [address]   TSH 08/04/2023 0.613  0.350 - 4.500 uIU/mL Final   Comment: Performed by a 3rd Generation assay with a functional sensitivity of <=0.01 uIU/mL. Performed at [facility], [address] [place], [place] [address]    Neisseria Gonorrhea 08/04/2023 Negative   Final   Chlamydia 08/04/2023 Negative   Final   Comment 08/04/2023 Normal Reference [place] Chlamydia - Negative   Final   Comment 08/04/2023 Normal Reference Range Neisseria Gonorrhea - Negative   Final   RPR Ser Ql 08/04/2023 NON REACTIVE  NON REACTIVE Final   Performed at [facility], [address] [place], [place] [address]   HIV-1 P24 Antigen - HIV24 08/04/2023 NON REACTIVE  NON REACTIVE Final   Comment: (NOTE) Detection of p24 may be inhibited by biotin in the  sample, causing false negative results in acute infection.    HIV 1/2 Antibodies 08/04/2023 NON REACTIVE  NON REACTIVE Final   Interpretation (HIV Ag Ab) 08/04/2023 A non reactive test result means that HIV 1 or HIV 2 antibodies and HIV 1 p24 antigen were not detected in the specimen.   Final   Performed at [facility], [address] [place], [place] [address]   Hepatitis B Surface Ag 08/04/2023 NON REACTIVE  NON REACTIVE Final   HCV Ab 08/04/2023 Reactive (A)  NON REACTIVE Final   Comment: (NOTE) The CDC recommends that a Reactive HCV antibody result be followed up  with a HCV Nucleic Acid Amplification test.     Hep A IgM 08/04/2023 NON REACTIVE  NON REACTIVE Final   Hep B C IgM 08/04/2023 NON REACTIVE  NON REACTIVE Final   Performed at [facility], [address] [place], [place] [address]   POC Amphetamine UR 08/04/2023 None Detected  NONE DETECTED (Cut Off Level 1000 ng/mL) Final   POC Secobarbital (BAR) 08/04/2023 None Detected  NONE DETECTED (Cut Off Level 300 ng/mL) Final   POC Buprenorphine (BUP) 08/04/2023 None Detected  NONE DETECTED (Cut Off Level 10 ng/mL) Final   POC Oxazepam (BZO) 08/04/2023 None Detected  NONE DETECTED (Cut Off Level 300 ng/mL) Final   POC Cocaine UR 08/04/2023 Positive (A)  NONE DETECTED (Cut Off Level 300 ng/mL) Final   POC Methamphetamine UR 08/04/2023 None Detected  NONE DETECTED (Cut Off Level 1000 ng/mL) Final   POC Morphine 08/04/2023 None Detected  NONE DETECTED (Cut Off Level 300 ng/mL) Final   POC Methadone UR 08/04/2023 None Detected  NONE DETECTED (Cut Off Level 300 ng/mL) Final   POC Oxycodone UR 08/04/2023 None Detected  NONE DETECTED (Cut Off Level 100 ng/mL) Final   POC Marijuana UR 08/04/2023 Positive (A)  NONE DETECTED (Cut Off Level 50 ng/mL) Final   Cocaine Metab Quant, Ur 08/04/2023 Positive (A)  Cutoff=300 Final   Benzoylecgonine GC/MS Conf 08/04/2023 35,080  Cutoff=150 ng/mL Final   Comment: (NOTE) Performed At: [facility] [address] [place], [place] [id number] Avis Epley PhD [id number]    Ethanol, Urine 08/04/2023 FID) (A)  Cutoff=0.020 Final   Comment: Positive Confirmation performed by Gas  Chromatography (GC    Ethanol, Ur - Confirmation 08/04/2023 0.099  Cutoff=0.020 % Final   Comment: (NOTE) GLUCOSE NEGATIVE Performed At: [facility] [address] [place], [place] [id number] Avis Epley PhD [id number]   Admission on 06/15/2023, Discharged on 06/16/2023  Component Date Value Ref Range Status   WBC 06/15/2023 5.8  4.0 - 10.5 K/uL Final   RBC 06/15/2023 4.61  4.22 - 5.81 MIL/uL Final   Hemoglobin 06/15/2023 15.5  13.0 - 17.0 g/dL Final   HCT 27/25/3664 46.0  39.0 - 52.0 % Final   MCV 06/15/2023 99.8  80.0 - 100.0 fL Final   MCH 06/15/2023 33.6  26.0 - 34.0 pg Final   MCHC 06/15/2023 33.7  30.0 - 36.0 g/dL Final   RDW 40/34/7425 13.3  11.5 - 15.5 % Final   Platelets 06/15/2023 244  150 - 400 K/uL Final   nRBC 06/15/2023 0.0  0.0 - 0.2 % Final   Neutrophils Relative % 06/15/2023 56  % Final   Neutro Abs 06/15/2023 3.3  1.7 - 7.7 K/uL Final   Lymphocytes Relative 06/15/2023 33  % Final   Lymphs Abs 06/15/2023 1.9  0.7 - 4.0 K/uL Final   Monocytes Relative 06/15/2023 6  % Final   Monocytes Absolute 06/15/2023 0.4  0.1 - 1.0 K/uL Final   Eosinophils Relative 06/15/2023 2  % Final   Eosinophils Absolute 06/15/2023 0.1  0.0 - 0.5 K/uL Final   Basophils Relative 06/15/2023 3  % Final   Basophils Absolute 06/15/2023 0.2 (H)  0.0 - 0.1 K/uL Final   Immature Granulocytes 06/15/2023 0  % Final   Abs Immature Granulocytes 06/15/2023 0.01  0.00 - 0.07 K/uL Final   Performed at [facility], [address], [place], [place] [address]   Sodium 06/15/2023 140  135 - 145 mmol/L Final   Potassium 06/15/2023 3.8  3.5 - 5.1 mmol/L Final   Chloride 06/15/2023 103  98 - 111 mmol/L Final   CO2 06/15/2023 28  22 - 32 mmol/L Final   Glucose, Bld 06/15/2023 108 (H)   70 - 99 mg/dL Final   Glucose reference range applies only to samples taken after fasting for at least 8 hours.   BUN 06/15/2023 5 (L)  6 - 20 mg/dL Final   Creatinine, Ser 06/15/2023 0.65  0.61 - 1.24 mg/dL Final   Calcium 75/64/3329 8.3 (L)  8.9 - 10.3 mg/dL Final   Total Protein 51/88/4166 8.6 (H)  6.5 - 8.1 g/dL Final   Albumin 04/01/1600 3.6  3.5 - 5.0 g/dL Final   AST 09/32/3557 296 (H)  15 - 41 U/L Final   ALT 06/15/2023 205 (H)  0 - 44 U/L Final   Alkaline Phosphatase 06/15/2023 145 (H)  38 - 126 U/L Final   Total Bilirubin 06/15/2023 0.4  0.3 - 1.2 mg/dL Final   GFR, Estimated 06/15/2023 >60  >60 mL/min Final   Comment: (NOTE) Calculated using the CKD-EPI Creatinine Equation (2021)    Anion gap 06/15/2023 9  5 - 15 Final   Performed at [facility], [address], [place], [place] [address]   Lipase 06/15/2023 27  11 - 51 U/L Final   Performed at [facility], [address], [place], [place] [address]   Specimen Source 06/16/2023 URINE, CLEAN CATCH   Final   Color, Urine 06/16/2023 YELLOW  YELLOW Final   APPearance 06/16/2023 HAZY (A)  CLEAR Final   Specific Gravity, Urine 06/16/2023  1.015  1.005 - 1.030 Final   pH 06/16/2023 6.0  5.0 - 8.0 Final   Glucose, UA 06/16/2023 NEGATIVE  NEGATIVE mg/dL Final   Hgb urine dipstick 06/16/2023 NEGATIVE  NEGATIVE Final   Bilirubin Urine 06/16/2023 NEGATIVE  NEGATIVE Final   Ketones, ur 06/16/2023 NEGATIVE  NEGATIVE mg/dL Final   Protein, ur 14/78/2956 NEGATIVE  NEGATIVE mg/dL Final   Nitrite 21/30/8657 NEGATIVE  NEGATIVE Final   Leukocytes,Ua 06/16/2023 NEGATIVE  NEGATIVE Final   RBC / HPF 06/16/2023 0-5  0 - 5 RBC/hpf Final   WBC, UA 06/16/2023 0-5  0 - 5 WBC/hpf Final   Comment:        Reflex urine culture not performed if WBC <=10, OR if Squamous epithelial cells >5. If Squamous epithelial cells >5 suggest recollection.    Bacteria, UA 06/16/2023 NONE SEEN  NONE SEEN Final   Squamous  Epithelial / HPF 06/16/2023 0-5  0 - 5 /HPF Final   Mucus 06/16/2023 PRESENT   Final   Performed at [facility], [address], [place], [place] [address]   Alcohol, Ethyl (B) 06/15/2023 288 (H)  <10 mg/dL Final   Comment: (NOTE) Lowest detectable limit for serum alcohol is 10 mg/dL.  For medical purposes only. Performed at [facility], [address], [place], [place] [address]    Salicylate Lvl 06/15/2023 <7.0 (L)  7.0 - 30.0 mg/dL Final   Performed at [facility], [address] [place], [place] [address]   Acetaminophen (Tylenol), Serum 06/15/2023 <10 (L)  10 - 30 ug/mL Final   Comment: (NOTE) Therapeutic concentrations vary significantly. A range of 10-30 ug/mL  may be an effective concentration for many patients. However, some  are best treated at concentrations outside of this range. Acetaminophen concentrations >150 ug/mL at 4 hours after ingestion  and >50 ug/mL at 12 hours after ingestion are often associated with  toxic reactions.  Performed at [facility], [address] [place], [place] [address]    Opiates 06/16/2023 NONE DETECTED  NONE DETECTED Final   Cocaine 06/16/2023 POSITIVE (A)  NONE DETECTED Final   Benzodiazepines 06/16/2023 NONE DETECTED  NONE DETECTED Final   Amphetamines 06/16/2023 NONE DETECTED  NONE DETECTED Final   Tetrahydrocannabinol 06/16/2023 POSITIVE (A)  NONE DETECTED Final   Barbiturates 06/16/2023 NONE DETECTED  NONE DETECTED Final   Comment: (NOTE) DRUG SCREEN FOR MEDICAL PURPOSES ONLY.  IF CONFIRMATION IS NEEDED FOR ANY PURPOSE, NOTIFY LAB WITHIN 5 DAYS.  LOWEST DETECTABLE LIMITS FOR URINE DRUG SCREEN Drug Class                     Cutoff (ng/mL) Amphetamine and metabolites    1000 Barbiturate and metabolites    200 Benzodiazepine                 200 Opiates and metabolites        300 Cocaine and metabolites        300 THC                             50 Performed at [facility], [address], [place], [place] [address]   There may be more visits with results that are not included.    Allergies: Codeine  Medications:  Facility Ordered Medications  Medication   acetaminophen (TYLENOL) tablet 650 mg   alum & mag hydroxide-simeth (MAALOX/MYLANTA) 200-200-20  MG/5ML suspension 30 mL   magnesium hydroxide (MILK OF MAGNESIA) suspension 30 mL   thiamine (VITAMIN B1) tablet 100 mg   multivitamin with minerals tablet 1 tablet   [EXPIRED] LORazepam (ATIVAN) tablet 1 mg   [EXPIRED] hydrOXYzine (ATARAX) tablet 25 mg   [EXPIRED] loperamide (IMODIUM) capsule 2-4 mg   [EXPIRED] ondansetron (ZOFRAN-ODT) disintegrating tablet 4 mg   haloperidol (HALDOL) tablet 5 mg   And   diphenhydrAMINE (BENADRYL) capsule 50 mg   haloperidol lactate (HALDOL) injection 5 mg   And   diphenhydrAMINE (BENADRYL) injection 50 mg   And   LORazepam (ATIVAN) injection 2 mg   haloperidol lactate (HALDOL) injection 10 mg   And   diphenhydrAMINE (BENADRYL) injection 50 mg   And   LORazepam (ATIVAN) injection 2 mg   traZODone (DESYREL) tablet 50 mg   gabapentin (NEURONTIN) capsule 400 mg   nicotine polacrilex (NICORETTE) gum 2 mg   dicyclomine (BENTYL) tablet 20 mg   methocarbamol (ROBAXIN) tablet 500 mg   naproxen (NAPROSYN) tablet 500 mg   [COMPLETED] thiamine (VITAMIN B1) injection 100 mg   PTA Medications  Medication Sig   DULoxetine (CYMBALTA) 30 MG capsule Take 1 capsule (30 mg total) by mouth daily. (Patient not taking: Reported on 12/07/2023)   gabapentin (NEURONTIN) 300 MG capsule Take 2 capsules (600 mg total) by mouth 3 (three) times daily.   hydrOXYzine (ATARAX) 25 MG tablet Take 1 tablet (25 mg total) by mouth every 6 (six) hours as needed for anxiety.   nicotine (NICODERM CQ - DOSED IN MG/24 HR) 7 mg/24hr patch Place 1 patch (7 mg total) onto the skin daily.   traZODone (DESYREL) 50 MG tablet Take 1 tablet (50 mg total) by  mouth at bedtime as needed for sleep.   acetaminophen (TYLENOL) 325 MG tablet Take 2 tablets (650 mg total) by mouth every 6 (six) hours as needed for mild pain (pain score 1-3).   naproxen sodium (ALEVE) 220 MG tablet Take 220 mg by mouth.   [EXPIRED] predniSONE (DELTASONE) 20 MG tablet Take 2 tablets (40 mg total) by mouth daily with breakfast for 5 days.    Long Term Goals: Improvement in symptoms so as ready for discharge  Short Term Goals: Patient will verbalize feelings in meetings with treatment team members., Patient will attend at least of 50% of the groups daily., and Pt will complete the PHQ9 on admission, day 3 and discharge.  Medical Decision Making  Pt is appropriate for treatment at [facility] for detox.  UDS + cocaine, benzodiazepines and THC.   Substance induced mood disorder Bipolar I disorder, current episode depressed -start Seorquel 100 mg nightly - stop trazodone  Polysubstance abuse -COWS with clonidine taper -CIWA with Ativan -Continue gabapentin 400 mg 3 times daily  Normocytic anemia -Could be iron deficiency anemia or hemodilution  Lab and imaging reviewed and include:      SARS Coronavirus 2 by RT PCR (hospital order, performed in [facility] hospital lab) *cepheid single result test* Anterior Nasal Swab         Ethanol         CBC with Differential         Comprehensive metabolic panel         Lipid panel         Magnesium         TSH         Hemoglobin A1c         POCT Urine  Drug Screen - (I-Screen)      Recommendations  Based on my evaluation the patient does not appear to have an emergency medical condition.  Lorri Frederick, MD 12/11/23  12:06 PM

## 2023-12-12 ENCOUNTER — Other Ambulatory Visit: Payer: Self-pay

## 2023-12-12 DIAGNOSIS — R262 Difficulty in walking, not elsewhere classified: Secondary | ICD-10-CM | POA: Diagnosis not present

## 2023-12-12 DIAGNOSIS — F1510 Other stimulant abuse, uncomplicated: Secondary | ICD-10-CM | POA: Diagnosis not present

## 2023-12-12 DIAGNOSIS — G8921 Chronic pain due to trauma: Secondary | ICD-10-CM | POA: Diagnosis not present

## 2023-12-12 DIAGNOSIS — F1929 Other psychoactive substance dependence with unspecified psychoactive substance-induced disorder: Secondary | ICD-10-CM | POA: Diagnosis not present

## 2023-12-12 MED ORDER — NICOTINE POLACRILEX 2 MG MT GUM: 2.0000 mg | CHEWING_GUM | OROMUCOSAL | 0 refills | Status: DC | PRN

## 2023-12-12 MED ORDER — QUETIAPINE FUMARATE 100 MG PO TABS: 100.0000 mg | ORAL_TABLET | Freq: Every day | ORAL | 0 refills | Status: DC

## 2023-12-12 MED ORDER — VITAMIN B-1 100 MG PO TABS: 100.0000 mg | ORAL_TABLET | Freq: Every day | ORAL | Status: DC

## 2023-12-12 MED ORDER — ADULT MULTIVITAMIN W/MINERALS CH: 1.0000 | ORAL_TABLET | Freq: Every day | ORAL | Status: DC

## 2023-12-12 MED ORDER — GABAPENTIN 300 MG PO CAPS: 600.0000 mg | ORAL_CAPSULE | Freq: Three times a day (TID) | ORAL | 0 refills | Status: DC

## 2023-12-12 NOTE — ED Notes (Signed)
 Patient is resting in bed with eyes closed without any distress. Staff will continue to monitor safety and for changes in condition.

## 2023-12-12 NOTE — ED Provider Notes (Signed)
 [facility]/OBS ASAP Discharge Summary  Date and Time: 12/12/2023 10:34 AM  Name: Mark Hammond  MRN:  [id number]   Discharge Diagnoses:  Final diagnoses:  Polysubstance (including opioids) dependence, daily use (HCC)  Stimulant use disorder  Tobacco use disorder  Substance induced mood disorder (HCC)  Severe episode of recurrent major depressive disorder, without psychotic features (HCC)  Chronic pain due to trauma  Ambulatory dysfunction  Alcohol use disorder    Subjective:  On day of discharge patient was interviewed, reports he is doing well.  Rates his depression 3/10, 10 being most severe.  He denies any suicidal ideations, homicidal ideations, or hallucinations.  He reports no cravings or withdrawal symptoms on day of discharge.  He agrees to have a prescription of Seroquel 100 mg be given at discharge, with risk and benefits explained, and with patient instructed to discontinue use if any adverse effects are noted.  He is also amenable to having outpatient follow-up arranged at [facility] for medication management.  On day of discharge, he reports he is motivated to arrange some sort of living situation with his aunt and uncle.  He reports that if this does not work he will speak with his ex girlfriend to see if he can stay with her.  He reports that if this does not work out, he will present himself to [facility] this evening for shelter.  Stay Summary:  Mark Hammond is a 33 yo male with a history of polysubstance use disorder (alcohol, tobacco cocaine, [employer], and cannabis), schizoaffective disorder, self-reported bipolar disorder who initially presented to the [facility] on 12/06/2023 voluntarily for complaints of suicidal ideations and requesting detox from ongoing alcohol, cocaine, and cannabis use.  Patient expressed interest in transitioning to a residential rehabilitation program and was admitted to [facility] on 12/08/2023 to complete detox. dMedical hx of chronic left leg pain for which he was  prescribed Gabapentin and Naproxen. He has been medically assessed multiple times for this leg pain.  Patient was discharged from the [facility] back in February 2025 and reports relapsing on substances shortly after discharge.  During the patient's hospitalization at the [facility], patient had extensive initial psychiatric evaluation, with daily follow-up assessments focused on detoxification management.  Patient was discharged on the following medications: Seroquel 100 mg nightly for bipolar depression Gabapentin 300 mg 3 times daily for neuropathic pain and chronic pain associated with leg injury, with added benefit of sleep and anxiolytic   Patient's care was discussed during the interdisciplinary team meeting every day during the hospitalization.  The patient denies having side effects to prescribed psychiatric medication.  Gradually, patient started adjusting to milieu. The patient was evaluated each day by a clinical provider to ascertain response to treatment. Improvement was noted by the patient's report of decreasing symptoms, improved sleep and appetite, affect, medication tolerance, behavior, and participation in unit programming.  Patient was asked each day to complete a self inventory noting mood, mental status, pain, new symptoms, anxiety and concerns.    Symptoms were reported as significantly decreased or resolved completely by discharge.   On day of discharge, the patient reports that their mood is stable. The patient denied having suicidal thoughts for more than 48 hours prior to discharge.  Patient denies having homicidal thoughts.  Patient denies having auditory hallucinations.  Patient denies any visual hallucinations or other symptoms of psychosis. The patient was motivated to continue taking medication with a goal of continued improvement in mental health.   The patient reported that their withdrawal symptoms  and cravings responded well to the detox regimen, with overall benefit from  the detox program. Supportive psychotherapy was provided, and the patient participated in regular group therapy sessions focused on managing cravings and withdrawal. Coping skills, problem-solving, and relaxation techniques were also part of the program's therapeutic interventions.  Labs were reviewed with the patient, and abnormal results were discussed with the patient.  The patient is able to verbalize their individual safety plan to this provider.  # It is recommended to the patient to continue psychiatric medications as prescribed, after discharge from the hospital.    # It is recommended to the patient to follow up with their outpatient psychiatric provider and PCP.  # It was discussed with the patient, the impact of alcohol, drugs, tobacco have been there overall psychiatric and medical wellbeing, and total abstinence from substance use was recommended.  # Prescriptions provided or sent directly to preferred pharmacy at discharge. Patient agreeable to plan. Given opportunity to ask questions. Appears to feel comfortable with discharge.    # In the event of worsening symptoms, the patient is instructed to call the crisis hotline, 911 and or go to the nearest ED for appropriate evaluation and treatment of symptoms. To follow-up with primary care provider for other medical issues, concerns and or health care needs     Total Time spent with patient: 1.5 hours  Substance Use Hx: Alcohol: started at age 37. 1/5th and 4 40 oz, 2 16 oz, and 2 cartons a  wine daily.  1 week of sobriety at [facility] last time.  Dneies seizures or DTs Tobacco: 3 cigarretes daily, started ta age 89 Cannabis:1 bowl of weed a day, smokes it daily - slows his usage of other, appetite Cocaine: smokes, $40 daily Methamphetamines: denies Opiates (fentanyl / heroin): oxycodone and percocets, depending on what is available to him . Reports using approximately 4 pills daily.  IVDU: denies   Past Psychiatric Hx: No  current outpatient psychiatric follow-up Patient reports being previously diagnosed with bipolar disorder and depression. No current psychotropic medications Unable to recall   Past Medical History: PCP:No Medical Dx: Chronic unspecified left leg injury Allergies: codeine Trauma:Denies Seizures:Denies   Family Medical History: Unknown to pt   Family Psychiatric History: Unknown to pt   Social History: Patient lives in [place] with girlfriend Social Support: girlfriend Occupational JX:[id number] Children:denies  Current Medications:  Current Facility-Administered Medications  Medication Dose Route Frequency Provider Last Rate Last Admin   acetaminophen (TYLENOL) tablet 650 mg  650 mg Oral Q6H PRN Howie Ill, NP   650 mg at 12/12/23 0939   alum & mag hydroxide-simeth (MAALOX/MYLANTA) 200-200-20 MG/5ML suspension 30 mL  30 mL Oral Q4H PRN Howie Ill, NP       dicyclomine (BENTYL) tablet 20 mg  20 mg Oral Q6H PRN Carrion-Carrero, Karle Starch, MD   20 mg at 12/10/23 2124   haloperidol (HALDOL) tablet 5 mg  5 mg Oral TID PRN Howie Ill, NP       And   diphenhydrAMINE (BENADRYL) capsule 50 mg  50 mg Oral TID PRN Howie Ill, NP       haloperidol lactate (HALDOL) injection 5 mg  5 mg Intramuscular TID PRN Howie Ill, NP       And   diphenhydrAMINE (BENADRYL) injection 50 mg  50 mg Intramuscular TID PRN Howie Ill, NP       And   LORazepam (ATIVAN) injection 2 mg  2 mg Intramuscular TID  PRN Howie Ill, NP       haloperidol lactate (HALDOL) injection 10 mg  10 mg Intramuscular TID PRN Howie Ill, NP       And   diphenhydrAMINE (BENADRYL) injection 50 mg  50 mg Intramuscular TID PRN Howie Ill, NP       And   LORazepam (ATIVAN) injection 2 mg  2 mg Intramuscular TID PRN Howie Ill, NP       gabapentin (NEURONTIN) capsule 400 mg  400 mg Oral TID Gaylyn Rong C, NP   400 mg at 12/12/23 1610   magnesium hydroxide (MILK OF MAGNESIA) suspension 30  mL  30 mL Oral Daily PRN Howie Ill, NP       methocarbamol (ROBAXIN) tablet 500 mg  500 mg Oral Q8H PRN Carrion-Carrero, Karle Starch, MD   500 mg at 12/09/23 1736   multivitamin with minerals tablet 1 tablet  1 tablet Oral Daily Gaylyn Rong C, NP   1 tablet at 12/12/23 9604   naproxen (NAPROSYN) tablet 500 mg  500 mg Oral BID PRN Lorri Frederick, MD   500 mg at 12/11/23 2143   nicotine polacrilex (NICORETTE) gum 2 mg  2 mg Oral PRN Carrion-Carrero, Karle Starch, MD       thiamine (VITAMIN B1) tablet 100 mg  100 mg Oral Daily Gaylyn Rong C, NP   100 mg at 12/12/23 0939   traZODone (DESYREL) tablet 50 mg  50 mg Oral QHS PRN Howie Ill, NP   50 mg at 12/11/23 2143   Current Outpatient Medications  Medication Sig Dispense Refill   acetaminophen (TYLENOL) 325 MG tablet Take 2 tablets (650 mg total) by mouth every 6 (six) hours as needed for mild pain (pain score 1-3).     DULoxetine (CYMBALTA) 30 MG capsule Take 1 capsule (30 mg total) by mouth daily. (Patient not taking: Reported on 12/07/2023) 30 capsule 0   gabapentin (NEURONTIN) 300 MG capsule Take 2 capsules (600 mg total) by mouth 3 (three) times daily. 180 capsule 0   hydrOXYzine (ATARAX) 25 MG tablet Take 1 tablet (25 mg total) by mouth every 6 (six) hours as needed for anxiety. 30 tablet 0   naproxen sodium (ALEVE) 220 MG tablet Take 220 mg by mouth.     nicotine (NICODERM CQ - DOSED IN MG/24 HR) 7 mg/24hr patch Place 1 patch (7 mg total) onto the skin daily. 28 patch 0   traZODone (DESYREL) 50 MG tablet Take 1 tablet (50 mg total) by mouth at bedtime as needed for sleep. 30 tablet 0    PTA Medications:  Facility Ordered Medications  Medication   acetaminophen (TYLENOL) tablet 650 mg   alum & mag hydroxide-simeth (MAALOX/MYLANTA) 200-200-20 MG/5ML suspension 30 mL   magnesium hydroxide (MILK OF MAGNESIA) suspension 30 mL   thiamine (VITAMIN B1) tablet 100 mg   multivitamin with minerals tablet 1 tablet   [EXPIRED]  LORazepam (ATIVAN) tablet 1 mg   [EXPIRED] hydrOXYzine (ATARAX) tablet 25 mg   [EXPIRED] loperamide (IMODIUM) capsule 2-4 mg   [EXPIRED] ondansetron (ZOFRAN-ODT) disintegrating tablet 4 mg   haloperidol (HALDOL) tablet 5 mg   And   diphenhydrAMINE (BENADRYL) capsule 50 mg   haloperidol lactate (HALDOL) injection 5 mg   And   diphenhydrAMINE (BENADRYL) injection 50 mg   And   LORazepam (ATIVAN) injection 2 mg   haloperidol lactate (HALDOL) injection 10 mg   And   diphenhydrAMINE (BENADRYL) injection 50 mg   And  LORazepam (ATIVAN) injection 2 mg   traZODone (DESYREL) tablet 50 mg   gabapentin (NEURONTIN) capsule 400 mg   nicotine polacrilex (NICORETTE) gum 2 mg   dicyclomine (BENTYL) tablet 20 mg   methocarbamol (ROBAXIN) tablet 500 mg   naproxen (NAPROSYN) tablet 500 mg   [COMPLETED] thiamine (VITAMIN B1) injection 100 mg   PTA Medications  Medication Sig   DULoxetine (CYMBALTA) 30 MG capsule Take 1 capsule (30 mg total) by mouth daily. (Patient not taking: Reported on 12/07/2023)   gabapentin (NEURONTIN) 300 MG capsule Take 2 capsules (600 mg total) by mouth 3 (three) times daily.   hydrOXYzine (ATARAX) 25 MG tablet Take 1 tablet (25 mg total) by mouth every 6 (six) hours as needed for anxiety.   nicotine (NICODERM CQ - DOSED IN MG/24 HR) 7 mg/24hr patch Place 1 patch (7 mg total) onto the skin daily.   traZODone (DESYREL) 50 MG tablet Take 1 tablet (50 mg total) by mouth at bedtime as needed for sleep.   acetaminophen (TYLENOL) 325 MG tablet Take 2 tablets (650 mg total) by mouth every 6 (six) hours as needed for mild pain (pain score 1-3).   naproxen sodium (ALEVE) 220 MG tablet Take 220 mg by mouth.   [EXPIRED] predniSONE (DELTASONE) 20 MG tablet Take 2 tablets (40 mg total) by mouth daily with breakfast for 5 days.       12/11/2023   12:40 PM 12/08/2023    1:21 PM 11/25/2023    1:57 PM  Depression screen PHQ 2/9  Decreased Interest 1 2 1   Down, Depressed, Hopeless 1 2 1    PHQ - 2 Score 2 4 2   Altered sleeping 2 3 1   Tired, decreased energy 1 2 1   Change in appetite 1 1 1   Feeling bad or failure about yourself  2 3 1   Trouble concentrating 1 3 1   Moving slowly or fidgety/restless 2 2 1   Suicidal thoughts 0 2 0  PHQ-9 Score 11 20 8   Difficult doing work/chores Somewhat difficult Very difficult     Flowsheet Row ED from 12/08/2023 in [facility] ED from 12/06/2023 in [facility] ED from 12/04/2023 in [facility] Emergency Department at [facility]  C-SSRS RISK CATEGORY No Risk No Risk High Risk       Musculoskeletal  Strength & Muscle Tone: within normal limits Gait & Station: normal Patient leans: N/A  Psychiatric Specialty Exam  Presentation  General Appearance:  Casual; Disheveled  Eye Contact: Minimal  Speech: Clear and Coherent; Normal Rate  Speech Volume: Normal  Handedness: -- (not assessed)   Mood and Affect  Mood: "Fine"  Affect: Congruent; Depressed   Thought Process  Thought Processes: Linear  Descriptions of Associations:Intact  Orientation:None  Thought Content:Logical  Diagnosis of Schizophrenia or Schizoaffective disorder in past: No    Hallucinations:Hallucinations: None  Ideas of Reference:None  Suicidal Thoughts:Suicidal Thoughts: No  Homicidal Thoughts:Homicidal Thoughts: No   Sensorium  Memory: Immediate Good; Recent Good; Remote Good  Judgment: Fair  Insight: Lacking   Executive Functions  Concentration: Fair  Attention Span: Fair  Recall: Fair  Fund of Knowledge: Fair  Language: Fair   Psychomotor Activity  Psychomotor Activity: Psychomotor Activity: Normal   Assets  Assets: Desire for Improvement; Resilience; Communication Skills   Sleep  Sleep: Sleep: Good   Nutritional Assessment (For OBS and [facility] admissions only) Has the patient had a weight loss or gain of 10 pounds or more in the  last 3  months?: No Has the patient had a decrease in food intake/or appetite?: No Does the patient have dental problems?: No Does the patient have eating habits or behaviors that may be indicators of an eating disorder including binging or inducing vomiting?: No Has the patient recently lost weight without trying?: 0 Has the patient been eating poorly because of a decreased appetite?: 0 Malnutrition Screening Tool Score: 0    Physical Exam  Physical Exam Vitals and nursing note reviewed.  Constitutional:      General: He is not in acute distress.    Appearance: He is not ill-appearing.  HENT:     Head: Normocephalic and atraumatic.  Eyes:     Extraocular Movements: Extraocular movements intact.     Conjunctiva/sclera: Conjunctivae normal.  Pulmonary:     Effort: Pulmonary effort is normal. No respiratory distress.  Skin:    General: Skin is warm and dry.  Neurological:     General: No focal deficit present.    Review of Systems  All other systems reviewed and are negative.  Blood pressure 93/63, pulse 73, temperature 97.9 F (36.6 C), temperature source Oral, resp. rate 18, SpO2 98%. There is no height or weight on file to calculate BMI.  Demographic Factors:  Male, Low socioeconomic status, and Unemployed  Loss Factors: Loss of significant relationship and Financial problems/change in socioeconomic status  Historical Factors: Impulsivity  Risk Reduction Factors:   Positive therapeutic relationship  Continued Clinical Symptoms:  Alcohol/Substance Abuse/Dependencies Unstable or Poor Therapeutic Relationship  Cognitive Features That Contribute To Risk:  None    Suicide Risk:  Mild:  Suicidal ideation of limited frequency, intensity, duration, and specificity.  There are no identifiable plans, no associated intent, mild dysphoria and related symptoms, good self-control (both objective and subjective assessment), few other risk factors, and identifiable protective factors,  including available and accessible social support.  Plan Of Care/Follow-up recommendations:  Activity: as tolerated  Diet: heart healthy  Other: -Follow-up with your outpatient psychiatric provider -instructions on appointment date, time, and address (location) are provided to you in discharge paperwork.  -Take your psychiatric medications as prescribed at discharge - instructions are provided to you in the discharge paperwork  -Follow-up with outpatient primary care doctor and other specialists -for management of preventative medicine and chronic medical disease  -- Referral to neurology was submitted on discharge for patient's unspecified left leg injury with ambulatory dysfunction.  -Testing: Follow-up with outpatient provider for abnormal lab results:   Lab Results  Component Value Date   HGBA1C 6.0 (H) 12/08/2023   HGBA1C 5.9 (H) 11/25/2023   HGBA1C 5.4 08/04/2023       Latest Ref Rng & Units 12/08/2023   12:00 AM 11/25/2023    6:11 AM 11/21/2023   11:30 PM  Hepatic Function  Total Protein 6.5 - 8.1 g/dL 6.2  7.0  8.1   Albumin 3.5 - 5.0 g/dL 2.3  2.3  2.8   AST 15 - 41 U/L 101  81  91   ALT 0 - 44 U/L 55  58  55   Alk Phosphatase 38 - 126 U/L 125  84  87   Total Bilirubin 0.0 - 1.2 mg/dL 0.4  0.7  0.8      -If you are prescribed an atypical antipsychotic medication, we recommend that your outpatient psychiatrist follow routine screening for side effects within 3 months of discharge, including monitoring: AIMS scale, height, weight, blood pressure, fasting lipid panel, HbA1c, and fasting blood  sugar.   -Recommend total abstinence from alcohol, tobacco, and other illicit drug use at discharge.   -If your psychiatric symptoms recur, worsen, or if you have side effects to your psychiatric medications, call your outpatient psychiatric provider, 911, [phone] or go to the nearest emergency department.  -If suicidal thoughts occur, immediately call your outpatient psychiatric  provider, 911, [phone] or go to the nearest emergency department.   Disposition: Patient is currently unhoused, requested to be discharged in order to arrange staying with his relatives, with backup plan including either staying with ex-girlfriend or presenting himself to [facility] at discharge.  Lorri Frederick, MD 12/12/2023, 10:34 AM

## 2023-12-12 NOTE — Discharge Planning (Signed)
 LCSW received update that patient is requesting to be discharged on today. LCSW went and spoke with patient at bedside. Patient reports he no longer wants to be considered for placement at [facility]. Patient reports his plan is to discharge and follow up with a neurologist and then have his updated clinicals sent to [facility] for review. Patient reports he has already spoken with his Earlie Lou regarding that plan and patient reports uncle will likely be picking him up today. LCSW explored if LCSW could follow up with Uncle to confirm plan and patient declined. Patient reports he will take care of everything himself. LCSW expressed understanding. Update provided to MD. Patient to discharge with outpatient resources in place. No other needs to report. LCSW to sign off. Please inform if further LCSW needs arise prior to discharge.   [name], LCSW Clinical Social Worker [facility] Ph: [phone]

## 2023-12-12 NOTE — ED Notes (Signed)
 Pt just woke, ambulating to the restroom w/walker. Is now sitting in group room. No s/sx of distress. No further concerns.

## 2023-12-12 NOTE — ED Notes (Addendum)
 Pt leaving now, ambulatory, using walker but will switch over to his own crutches once he gets to locker. Belongings form signed. Vitals stable. Dc paperwork given, along with med scrips and a bus ticket. No concerns upon DC. No s/sx of distress.

## 2023-12-13 ENCOUNTER — Other Ambulatory Visit: Payer: Self-pay

## 2023-12-13 ENCOUNTER — Emergency Department (HOSPITAL_COMMUNITY)
Admission: EM | Admit: 2023-12-13 | Discharge: 2023-12-15 | Disposition: A | Discharge status: Home / Self Care | Payer: MEDICAID | Attending: Emergency Medicine | Admitting: Emergency Medicine

## 2023-12-13 ENCOUNTER — Encounter (HOSPITAL_COMMUNITY): Payer: Self-pay

## 2023-12-13 DIAGNOSIS — Z79899 Other long term (current) drug therapy: Secondary | ICD-10-CM | POA: Diagnosis not present

## 2023-12-13 DIAGNOSIS — F1920 Other psychoactive substance dependence, uncomplicated: Secondary | ICD-10-CM

## 2023-12-13 DIAGNOSIS — F250 Schizoaffective disorder, bipolar type: Secondary | ICD-10-CM | POA: Diagnosis not present

## 2023-12-13 DIAGNOSIS — R45851 Suicidal ideations: Secondary | ICD-10-CM

## 2023-12-13 DIAGNOSIS — Z5900 Homelessness unspecified: Secondary | ICD-10-CM | POA: Diagnosis not present

## 2023-12-13 DIAGNOSIS — F329 Major depressive disorder, single episode, unspecified: Secondary | ICD-10-CM | POA: Diagnosis present

## 2023-12-13 DIAGNOSIS — F259 Schizoaffective disorder, unspecified: Secondary | ICD-10-CM | POA: Diagnosis present

## 2023-12-13 LAB — CBC WITH DIFFERENTIAL/PLATELET
Abs Immature Granulocytes: 0.04 10*3/uL (ref 0.00–0.07)
Basophils Absolute: 0.1 10*3/uL (ref 0.0–0.1)
Basophils Relative: 1 %
Eosinophils Absolute: 0.2 10*3/uL (ref 0.0–0.5)
Eosinophils Relative: 1 %
HCT: 39.2 % (ref 39.0–52.0)
Hemoglobin: 12.8 g/dL — ABNORMAL LOW (ref 13.0–17.0)
Immature Granulocytes: 0 %
Lymphocytes Relative: 25 %
Lymphs Abs: 2.6 10*3/uL (ref 0.7–4.0)
MCH: 30.7 pg (ref 26.0–34.0)
MCHC: 32.7 g/dL (ref 30.0–36.0)
MCV: 94 fL (ref 80.0–100.0)
Monocytes Absolute: 1.2 10*3/uL — ABNORMAL HIGH (ref 0.1–1.0)
Monocytes Relative: 11 %
Neutro Abs: 6.4 10*3/uL (ref 1.7–7.7)
Neutrophils Relative %: 62 %
Platelets: 360 10*3/uL (ref 150–400)
RBC: 4.17 MIL/uL — ABNORMAL LOW (ref 4.22–5.81)
RDW: 14.2 % (ref 11.5–15.5)
WBC: 10.4 10*3/uL (ref 4.0–10.5)
nRBC: 0 % (ref 0.0–0.2)

## 2023-12-13 LAB — RAPID URINE DRUG SCREEN, HOSP PERFORMED
Amphetamines: NOT DETECTED
Barbiturates: NOT DETECTED
Benzodiazepines: POSITIVE — AB
Cocaine: POSITIVE — AB
Opiates: POSITIVE — AB
Tetrahydrocannabinol: POSITIVE — AB

## 2023-12-13 LAB — COMPREHENSIVE METABOLIC PANEL
ALT: 118 U/L — ABNORMAL HIGH (ref 0–44)
AST: 155 U/L — ABNORMAL HIGH (ref 15–41)
Albumin: 2.9 g/dL — ABNORMAL LOW (ref 3.5–5.0)
Alkaline Phosphatase: 128 U/L — ABNORMAL HIGH (ref 38–126)
Anion gap: 10 (ref 5–15)
BUN: 11 mg/dL (ref 6–20)
CO2: 23 mmol/L (ref 22–32)
Calcium: 8.8 mg/dL — ABNORMAL LOW (ref 8.9–10.3)
Chloride: 104 mmol/L (ref 98–111)
Creatinine, Ser: 0.62 mg/dL (ref 0.61–1.24)
GFR, Estimated: >60 mL/min (ref >60)
Glucose, Bld: 107 mg/dL — ABNORMAL HIGH (ref 70–99)
Potassium: 3.9 mmol/L (ref 3.5–5.1)
Sodium: 137 mmol/L (ref 135–145)
Total Bilirubin: 0.4 mg/dL (ref 0.0–1.2)
Total Protein: 8.1 g/dL (ref 6.5–8.1)

## 2023-12-13 LAB — ETHANOL: Alcohol, Ethyl (B): 204 mg/dL — ABNORMAL HIGH (ref <10)

## 2023-12-13 LAB — ACETAMINOPHEN LEVEL: Acetaminophen (Tylenol), Serum: 11 ug/mL (ref 10–30)

## 2023-12-13 LAB — SALICYLATE LEVEL: Salicylate Lvl: <7 mg/dL — ABNORMAL LOW (ref 7.0–30.0)

## 2023-12-13 MED ORDER — LORAZEPAM 2 MG/ML IJ SOLN: 2.0000 mg | Freq: Four times a day (QID) | INTRAMUSCULAR | Status: DC | PRN

## 2023-12-13 MED ORDER — QUETIAPINE FUMARATE 100 MG PO TABS
100.0000 mg | ORAL_TABLET | Freq: Every day | ORAL | Status: DC
  Administered 2023-12-13 – 2023-12-14 (×2): 100 mg via ORAL
  Filled 2023-12-13 (×2): qty 1

## 2023-12-13 MED ORDER — ZIPRASIDONE MESYLATE 20 MG IM SOLR: 10.0000 mg | Freq: Four times a day (QID) | INTRAMUSCULAR | Status: DC | PRN

## 2023-12-13 MED ORDER — DIPHENHYDRAMINE HCL 50 MG/ML IJ SOLN: 50.0000 mg | Freq: Four times a day (QID) | INTRAMUSCULAR | Status: DC | PRN

## 2023-12-13 MED ORDER — GABAPENTIN 300 MG PO CAPS
300.0000 mg | ORAL_CAPSULE | Freq: Three times a day (TID) | ORAL | Status: DC
  Administered 2023-12-13 – 2023-12-15 (×5): 300 mg via ORAL
  Filled 2023-12-13 (×5): qty 1

## 2023-12-13 MED ORDER — HYDROXYZINE HCL 25 MG PO TABS
50.0000 mg | ORAL_TABLET | Freq: Four times a day (QID) | ORAL | Status: DC | PRN
  Administered 2023-12-14: 50 mg via ORAL
  Filled 2023-12-13: qty 2

## 2023-12-13 MED ORDER — ACETAMINOPHEN 325 MG PO TABS
650.0000 mg | ORAL_TABLET | Freq: Four times a day (QID) | ORAL | Status: DC | PRN
  Administered 2023-12-13 – 2023-12-15 (×4): 650 mg via ORAL
  Filled 2023-12-13 (×4): qty 2

## 2023-12-13 NOTE — ED Notes (Signed)
 Pt moved to room 13 for tele-psych consult with sitter at bedside.

## 2023-12-13 NOTE — [facility] Counselor (Signed)
 TTS Note:    @ 7:10 PM-Patient was deferred to [facility] Telehealth Coordinator, Lauren, at [phone]. The [facility] Care Coordinator will notify the patient's care team when the [facility] provider is ready to initiate the telehealth assessment. If there are any questions, please reach out to the [facility] Coordinator.

## 2023-12-13 NOTE — ED Provider Notes (Signed)
 [facility]ERG[facility] DEPARTMENT AT [facility] Provider Note   CSN: [id number] Arrival date & time: 12/13/23  1715     History  Chief Complaint  Patient presents with   IVC    Mark Hammond is a 33 y.o. male history of schizophrenia, here presenting with involuntary commitment.  Patient states that he is homeless and he apparently told someone that he wants to just lay down and never wake up.  When I talked to him, he states that "I am here because I am involuntarily committed and I am supposed to stay for 72 hours."  Patient admits to drinking alcohol.  Patient was noted to take amphetamines but denies it to me.  Patient has seen psychiatry multiple times last month.  The history is provided by the patient.       Home Medications Prior to Admission medications   Medication Sig Start Date End Date Taking? Authorizing Provider  acetaminophen (TYLENOL) 325 MG tablet Take 2 tablets (650 mg total) by mouth every 6 (six) hours as needed for mild pain (pain score 1-3). 11/26/23   Carlyn Reichert, MD  gabapentin (NEURONTIN) 300 MG capsule Take 2 capsules (600 mg total) by mouth 3 (three) times daily. 12/12/23 01/11/24  Carrion-Carrero, Karle Starch, MD  hydrOXYzine (ATARAX) 25 MG tablet Take 1 tablet (25 mg total) by mouth every 6 (six) hours as needed for anxiety. 11/26/23   Carlyn Reichert, MD  Multiple Vitamin (MULTIVITAMIN WITH MINERALS) TABS tablet Take 1 tablet by mouth daily. 12/13/23   Carrion-Carrero, Karle Starch, MD  nicotine polacrilex (NICORETTE) 2 MG gum Take 1 each (2 mg total) by mouth as needed for smoking cessation. 12/12/23   Carrion-Carrero, Karle Starch, MD  QUEtiapine (SEROQUEL) 100 MG tablet Take 1 tablet (100 mg total) by mouth at bedtime. 12/12/23   Carrion-Carrero, Karle Starch, MD  thiamine (VITAMIN B-1) 100 MG tablet Take 1 tablet (100 mg total) by mouth daily. 12/13/23   Carrion-Carrero, Karle Starch, MD      Allergies    Codeine    Review of Systems   Review of Systems   Psychiatric/Behavioral:  Positive for agitation.   All other systems reviewed and are negative.   Physical Exam Updated Vital Signs BP 99/62   Pulse 97   Temp 98.6 F (37 C) (Oral)   Resp 16   Ht 5\' 4"  (1.626 m)   Wt 62 kg   SpO2 97%   BMI 23.46 kg/m  Physical Exam Vitals and nursing note reviewed.  Constitutional:      Comments: Chronically ill and disheveled.  Patient walking with crutches but able to bear weight on both legs  HENT:     Head: Normocephalic.     Nose: Nose normal.     Mouth/Throat:     Mouth: Mucous membranes are moist.  Eyes:     Extraocular Movements: Extraocular movements intact.     Pupils: Pupils are equal, round, and reactive to light.  Cardiovascular:     Rate and Rhythm: Normal rate and regular rhythm.     Pulses: Normal pulses.     Heart sounds: Normal heart sounds.  Pulmonary:     Effort: Pulmonary effort is normal.     Breath sounds: Normal breath sounds.  Abdominal:     General: Abdomen is flat.     Palpations: Abdomen is soft.  Musculoskeletal:        General: Normal range of motion.     Cervical back: Normal range of motion and neck supple.  Skin:    General: Skin is warm.     Capillary Refill: Capillary refill takes less than 2 seconds.  Neurological:     General: No focal deficit present.     Mental Status: He is alert and oriented to person, place, and time.  Psychiatric:     Comments: Withdrawn and refuses to answer questions     ED Results / Procedures / Treatments   Labs (all labs ordered are listed, but only abnormal results are displayed) Labs Reviewed  CBC WITH DIFFERENTIAL/PLATELET - Abnormal; Notable for the following components:      Result Value   RBC 4.17 (*)    Hemoglobin 12.8 (*)    Monocytes Absolute 1.2 (*)    All other components within normal limits  ETHANOL - Abnormal; Notable for the following components:   Alcohol, Ethyl (B) 204 (*)    All other components within normal limits  SALICYLATE LEVEL -  Abnormal; Notable for the following components:   Salicylate Lvl <7.0 (*)    All other components within normal limits  RAPID URINE DRUG SCREEN, HOSP PERFORMED - Abnormal; Notable for the following components:   Opiates POSITIVE (*)    Cocaine POSITIVE (*)    Benzodiazepines POSITIVE (*)    Tetrahydrocannabinol POSITIVE (*)    All other components within normal limits  ACETAMINOPHEN LEVEL  COMPREHENSIVE METABOLIC PANEL    EKG None  Radiology No results found.  Procedures Procedures    Medications Ordered in ED Medications - No data to display  ED Course/ Medical Decision Making/ A&P                                 Medical Decision Making Mark Hammond is a 33 y.o. male here presenting with suicidal ideation.  Patient has history of schizophrenia and not taking his meds.  Patient is IVC prior to arrival.  I filled out first exam.  I reviewed patient's labs and patient's alcohol level is 200 and AST and ALT are elevated consistent with alcohol abuse.  Patient's UDS is also positive for cocaine and opiates and benzos and marijuana.  Patient is medically cleared for psych eval  11:23 PM Psych recommend inpatient admission   Amount and/or Complexity of Data Reviewed Labs: ordered.  Risk OTC drugs.    Final Clinical Impression(s) / ED Diagnoses Final diagnoses:  None    Rx / DC Orders ED Discharge Orders     None         Charlynne Pander, MD 12/13/23 2323

## 2023-12-13 NOTE — ED Triage Notes (Addendum)
 Patient Mark Hammond under IVC. Said "I dont know why the fuck I am here. Read the papers. Read the chart." Complaining of left knee pain. Patient said we are bothering him by asking him all these questions. Irritable and cussing on arrival. Patient is not taking his medications for schizophrenia or bipolar. Said he wants to go to sleep and not wake up.

## 2023-12-13 NOTE — Consult Note (Signed)
 [facility] Consult Note  Patient Name: Crandall Harvel MRN: [id number] DOB: 1991-04-17 DATE OF Consult: 12/13/2023  PRIMARY PSYCHIATRIC DIAGNOSES   1.  Schizoaffective Disorder, Bipolar type 2.  Polysubstance Dependence   RECOMMENDATIONS  Recommendations: Medication recommendations: Continue on meds that were prescribed at Crisis Unit: Seroquel 100 mg at bedtime for mood sx's and hallucinations; Neurontin, 300 mg tid for neuropathic pain and anxiety.  PRN's:  Hydroxyzine 50 mg q6h PRN moderate anxiety; Geodon 10 mg IM PRN q6h and Ativan 2 mg q6h PRN and Benadryl, 50 mg IM q6h PRN severe agitation/aggression Non-Medication/therapeutic recommendations: Continue with matter-of-fact emotional support while in ED, pending transfer; Continue with suicide precautions until can be admitted to Psychiatry Is inpatient psychiatric hospitalization recommended for this patient? Yes (Explain why): Patient has had a couple of hospitalizations at the Crisis Unit over the past weeks, getting out of most recent stay just yesterday.  Has continued with suicidal ideation and persecutory auditory hallucinations, and patient did attempt to overdose on Fentanyl in late December because of these symptoms.  Suicidal thoughts related to the chronic pain that he is having that is felt to be a sequela of his chronic drug usage.  Given his continuing suicidal thoughts, with plan to overdose, plus his psychotic sx's, meets criteria for IVC, and I support his being involuntarily committed for assessment and treatment Follow-Up Telepsychiatry C/L services: We will sign off for now. Please re-consult our service if needed for any concerning changes in the patient's condition, discharge planning, or questions. Communication: Treatment team members (and family members if applicable) who were involved in treatment/care discussions and planning, and with whom we spoke or engaged with via secure text/chat, include the following: Sent  secure message to Dr. Silverio Lay, ED attending, and staff, outlining above recommendations  Thank you for involving [place] in the care of this patient. If you have any additional questions or concerns, please call [phone] and ask for the provider on-call.   TELEPSYCHIATRY ATTESTATION & CONSENT   As the provider for this telehealth consult, I attest that I verified the patient's identity using two separate identifiers, introduced myself to the patient, provided my credentials, disclosed my location, and performed this encounter via a HIPAA-compliant, real-time, face-to-face, two-way, interactive audio and video platform and with the full consent and agreement of the patient (or guardian as applicable.)   Patient physical location: [facility]. Telehealth provider physical location: home office in state of [place].  Video start time: 2110h EDT  Video end time: 2130h EDT  Total time spent in this encounter was 30 minutes, including record review, clinical interview, behavior observations, discussion of impressions and recommendations (including medications and hospitalization), and consultation/communication with relevant parties   IDENTIFYING DATA  Mark Hammond is a 33 y.o. year-old male for whom a psychiatric consultation has been ordered by the primary provider. The patient was identified using two separate identifiers.  CHIEF COMPLAINT/REASON FOR CONSULT   I just want to give up and die.  I don't think this pain is going to get any better, and the doctors say they can't help me.   HISTORY OF PRESENT ILLNESS (HPI)  The patient presents with suicidal ideation and depression, having been picked up by authorities on an IVC and brought to the ED.  Patient states that he has struggled with schizoaffective disorder since he was teen, with two suicide attempts in his teens.  Since then, has begun abusing multiple different drugs, including opiates, cocaine, and methamphetamine.  Over past year,  has  developed neuropathic pain that has been persistent and severe, and doctors suspect it is a chronic sequela of his drug usage.  Patient so overwhelmed by pain, OD's on Fentanyl in December.  Pain has only persisted since then.  Has had a couple of admissions to [facility] over the past weeks, and he was just discharged from there yesterday, with recommendations to have him on Seroquel and Neurontin.  Patient did stay with his uncle last night, but admitted to uncle his suicidal thoughts and plan, and thus IVC taken out and patient brought the ED.  Patient denies homicidal ideation.  Has persecutory auditory hallucinations (likely a combo of both schizoaffective disorder and drug intoxication) that urge him to die.  UDS positive for several substances.   PAST PSYCHIATRIC HISTORY   Otherwise as per HPI above.  PAST MEDICAL HISTORY  Past Medical History:  Diagnosis Date   Alcoholic gastritis    Bipolar 1 disorder (HCC)    Dental abscess    Depression    Drug abuse (HCC)    ETOH abuse    Hallucination    Schizo affective schizophrenia [facility])      HOME MEDICATIONS  Facility Ordered Medications  Medication   acetaminophen (TYLENOL) tablet 650 mg   QUEtiapine (SEROQUEL) tablet 100 mg   gabapentin (NEURONTIN) capsule 300 mg   hydrOXYzine (ATARAX) tablet 50 mg   ziprasidone (GEODON) injection 10 mg   LORazepam (ATIVAN) injection 2 mg   diphenhydrAMINE (BENADRYL) injection 50 mg   PTA Medications  Medication Sig   hydrOXYzine (ATARAX) 25 MG tablet Take 1 tablet (25 mg total) by mouth every 6 (six) hours as needed for anxiety.   acetaminophen (TYLENOL) 325 MG tablet Take 2 tablets (650 mg total) by mouth every 6 (six) hours as needed for mild pain (pain score 1-3).   QUEtiapine (SEROQUEL) 100 MG tablet Take 1 tablet (100 mg total) by mouth at bedtime.   thiamine (VITAMIN B-1) 100 MG tablet Take 1 tablet (100 mg total) by mouth daily.   Multiple Vitamin (MULTIVITAMIN WITH MINERALS) TABS  tablet Take 1 tablet by mouth daily.   nicotine polacrilex (NICORETTE) 2 MG gum Take 1 each (2 mg total) by mouth as needed for smoking cessation.   gabapentin (NEURONTIN) 300 MG capsule Take 2 capsules (600 mg total) by mouth 3 (three) times daily.     ALLERGIES  Allergies  Allergen Reactions   Codeine Other (See Comments)    Patient states parents told him he was allergic at a young age.    SOCIAL & SUBSTANCE USE HISTORY  Social History   Socioeconomic History   Marital status: Single    Spouse name: Not on file   Number of children: Not on file   Years of education: Not on file   Highest education level: Not on file  Occupational History   Not on file  Tobacco Use   Smoking status: Every Day    Current packs/day: 1.00    Types: Cigarettes    Passive exposure: Current   Smokeless tobacco: Never  Vaping Use   Vaping status: Every Day   Substances: THC  Substance and Sexual Activity   Alcohol use: Yes    Comment: 4-5x 40oz beers daily   Drug use: Yes    Frequency: 7.0 times per week    Types: "Crack" cocaine, Heroin, MDMA (Ecstacy), Marijuana, Cocaine    Comment: last cocaine use 2/24   Sexual activity: Yes  Other Topics Concern  Not on file  Social History Narrative   Pt lives in [place] with ex-girlfriend.  Pt is not followed by an outpatient psychiatric provider.   Social Drivers of Corporate investment banker Strain: Not on file  Food Insecurity: No Food Insecurity (12/08/2023)   Hunger Vital Sign    Worried About Running Out of Food in the Last Year: Never true    Ran Out of Food in the Last Year: Never true  Recent Concern: Food Insecurity - Food Insecurity Present (12/06/2023)   Hunger Vital Sign    Worried About Programme researcher, broadcasting/film/video in the Last Year: Often true    Ran Out of Food in the Last Year: Often true  Transportation Needs: Unmet Transportation Needs (12/08/2023)   PRAPARE - Administrator, Civil Service (Medical): Yes    Lack of  Transportation (Non-Medical): Yes  Physical Activity: Not on file  Stress: Not on file  Social Connections: Not on file   Social History   Tobacco Use  Smoking Status Every Day   Current packs/day: 1.00   Types: Cigarettes   Passive exposure: Current  Smokeless Tobacco Never   Social History   Substance and Sexual Activity  Alcohol Use Yes   Comment: 4-5x 40oz beers daily   Social History   Substance and Sexual Activity  Drug Use Yes   Frequency: 7.0 times per week   Types: "Crack" cocaine, Heroin, MDMA (Ecstacy), Marijuana, Cocaine   Comment: last cocaine use 2/24    .  FAMILY HISTORY  Family History  Problem Relation Age of Onset   Stroke Other    Family Psychiatric History (if known):  Patient did not report  MENTAL STATUS EXAM (MSE)  Mental Status Exam: General Appearance: Disheveled  Orientation:  Full (Time, Place, and Person)  Memory:  Immediate;   Fair Recent;   Fair Remote;   Fair  Concentration:  Concentration: Fair and Attention Span: Fair  Recall:  Fair  Attention  Fair  Eye Contact:  Fair  Speech:  Clear and Coherent and Normal Rate  Language:  Good  Volume:  Normal  Mood: I just don't care if I live.  I'd rather be dead because of this pain that won't go away  Affect:  Depressed and Flat  Thought Process:  Coherent  Thought Content:  Hallucinations: Command:  auditory  Suicidal Thoughts:  Yes.  with intent/plan  Homicidal Thoughts:  No  Judgement:  Impaired  Insight:  Shallow  Psychomotor Activity:  Normal  Akathisia:  Negative  Fund of Knowledge:  Fair    Assets:  Communication Skills Desire for Improvement Resilience  Cognition:  WNL  ADL's:  Impaired because of his chronic pain, and needs help with self-care  AIMS (if indicated):       VITALS  Blood pressure 99/62, pulse 97, temperature 98.6 F (37 C), temperature source Oral, resp. rate 16, height 5\' 4"  (1.626 m), weight 62 kg, SpO2 97%.  LABS  Admission on 12/13/2023   Component Date Value Ref Range Status   WBC 12/13/2023 10.4  4.0 - 10.5 K/uL Final   RBC 12/13/2023 4.17 (L)  4.22 - 5.81 MIL/uL Final   Hemoglobin 12/13/2023 12.8 (L)  13.0 - 17.0 g/dL Final   HCT 78/29/5621 39.2  39.0 - 52.0 % Final   MCV 12/13/2023 94.0  80.0 - 100.0 fL Final   MCH 12/13/2023 30.7  26.0 - 34.0 pg Final   MCHC 12/13/2023 32.7  30.0 - 36.0  g/dL Final   RDW 98/08/9146 14.2  11.5 - 15.5 % Final   Platelets 12/13/2023 360  150 - 400 K/uL Final   nRBC 12/13/2023 0.0  0.0 - 0.2 % Final   Neutrophils Relative % 12/13/2023 62  % Final   Neutro Abs 12/13/2023 6.4  1.7 - 7.7 K/uL Final   Lymphocytes Relative 12/13/2023 25  % Final   Lymphs Abs 12/13/2023 2.6  0.7 - 4.0 K/uL Final   Monocytes Relative 12/13/2023 11  % Final   Monocytes Absolute 12/13/2023 1.2 (H)  0.1 - 1.0 K/uL Final   Eosinophils Relative 12/13/2023 1  % Final   Eosinophils Absolute 12/13/2023 0.2  0.0 - 0.5 K/uL Final   Basophils Relative 12/13/2023 1  % Final   Basophils Absolute 12/13/2023 0.1  0.0 - 0.1 K/uL Final   Immature Granulocytes 12/13/2023 0  % Final   Abs Immature Granulocytes 12/13/2023 0.04  0.00 - 0.07 K/uL Final   Performed at [facility], [address], [place], [place] [address]   Sodium 12/13/2023 137  135 - 145 mmol/L Final   Potassium 12/13/2023 3.9  3.5 - 5.1 mmol/L Final   Chloride 12/13/2023 104  98 - 111 mmol/L Final   CO2 12/13/2023 23  22 - 32 mmol/L Final   Glucose, Bld 12/13/2023 107 (H)  70 - 99 mg/dL Final   Glucose reference range applies only to samples taken after fasting for at least 8 hours.   BUN 12/13/2023 11  6 - 20 mg/dL Final   Creatinine, Ser 12/13/2023 0.62  0.61 - 1.24 mg/dL Final   Calcium 21/30/8657 8.8 (L)  8.9 - 10.3 mg/dL Final   Total Protein 84/69/6295 8.1  6.5 - 8.1 g/dL Final   Albumin 28/41/3244 2.9 (L)  3.5 - 5.0 g/dL Final   AST 10/05/7251 155 (H)  15 - 41 U/L Final   ALT 12/13/2023 118 (H)  0 - 44 U/L Final   Alkaline  Phosphatase 12/13/2023 128 (H)  38 - 126 U/L Final   Total Bilirubin 12/13/2023 0.4  0.0 - 1.2 mg/dL Final   GFR, Estimated 12/13/2023 >60  >60 mL/min Final   Comment: (NOTE) Calculated using the CKD-EPI Creatinine Equation (2021)    Anion gap 12/13/2023 10  5 - 15 Final   Performed at [facility], [address] [place], [place] [address]   Alcohol, Ethyl (B) 12/13/2023 204 (H)  <10 mg/dL Final   Comment: (NOTE) Lowest detectable limit for serum alcohol is 10 mg/dL.  For medical purposes only. Performed at [facility], [address] [place], [place] [address]    Salicylate Lvl 12/13/2023 <7.0 (L)  7.0 - 30.0 mg/dL Final   Performed at [facility], [address] [place], [place] [address]   Acetaminophen (Tylenol), Serum 12/13/2023 11  10 - 30 ug/mL Final   Comment: (NOTE) Therapeutic concentrations vary significantly. A range of 10-30 ug/mL  may be an effective concentration for many patients. However, some  are best treated at concentrations outside of this range. Acetaminophen concentrations >150 ug/mL at 4 hours after ingestion  and >50 ug/mL at 12 hours after ingestion are often associated with  toxic reactions.  Performed at [facility], [address] [place], [place] [address]    Opiates 12/13/2023 POSITIVE (A)  NONE DETECTED Final   Cocaine 12/13/2023 POSITIVE (A)  NONE DETECTED Final   Benzodiazepines 12/13/2023 POSITIVE (A)  NONE DETECTED Final  Amphetamines 12/13/2023 NONE DETECTED  NONE DETECTED Final   Tetrahydrocannabinol 12/13/2023 POSITIVE (A)  NONE DETECTED Final   Barbiturates 12/13/2023 NONE DETECTED  NONE DETECTED Final   Comment: (NOTE) DRUG SCREEN FOR MEDICAL PURPOSES ONLY.  IF CONFIRMATION IS NEEDED FOR ANY PURPOSE, NOTIFY LAB WITHIN 5 DAYS.  LOWEST DETECTABLE LIMITS FOR URINE DRUG SCREEN Drug Class                     Cutoff (ng/mL) Amphetamine and metabolites     1000 Barbiturate and metabolites    200 Benzodiazepine                 200 Opiates and metabolites        300 Cocaine and metabolites        300 THC                            50 Performed at [facility], [address] [place], [place] [address]     PSYCHIATRIC REVIEW OF SYSTEMS (ROS)  ROS: Notable for the following relevant positive findings: Review of Systems  Constitutional: Negative.   HENT: Negative.    Eyes: Negative.   Respiratory: Negative.    Cardiovascular: Negative.   Gastrointestinal: Negative.   Genitourinary: Negative.   Musculoskeletal: Negative.   Skin: Negative.   Neurological:  Positive for sensory change and focal weakness.  Psychiatric/Behavioral:  Positive for depression, hallucinations, substance abuse and suicidal ideas. The patient is nervous/anxious.     Additional findings:      Musculoskeletal: No abnormal movements observed      Gait & Station: Laying/Sitting      Pain Screening: Present - mild to moderate      Nutrition & Dental Concerns: Reviewed   RISK FORMULATION/ASSESSMENT  Is the patient experiencing any suicidal or homicidal ideations: Yes       Explain if yes: Patient expresses history of chronic suicidal ideation, but worsening over past several months because of increasing neuropathic pain in lower extremities and buttocks.  Attempted overdose in late December with Fentanyl because of these symptoms.  Protective factors considered for safety management:   Patient does have some local support (stayed with uncle last night), but can still relapse into drug usage, increasing risks of suicide, given his hallucinations and ideation  Risk factors/concerns considered for safety management:  Prior attempt Depression Substance abuse/dependence Physical illness/chronic pain Recent loss Access to lethal means Hopelessness Impulsivity Isolation Barriers to accessing treatment Male gender Unmarried  Is there a  safety management plan with the patient and treatment team to minimize risk factors and promote protective factors: No           Explain: Patient does not have reliable support in the community  Is crisis care placement or psychiatric hospitalization recommended: Yes     Based on my current evaluation and risk assessment, patient is determined at this time to be at:  High risk  *RISK ASSESSMENT Risk assessment is a dynamic process; it is possible that this patient's condition, and risk level, may change. This should be re-evaluated and managed over time as appropriate. Please re-consult psychiatric consult services if additional assistance is needed in terms of risk assessment and management. If your team decides to discharge this patient, please advise the patient how to best access emergency psychiatric services, or to call 911, if their condition worsens or they feel unsafe in any way.  Ezekiel Slocumb, MD Telepsychiatry Consult Services

## 2023-12-14 DIAGNOSIS — F1920 Other psychoactive substance dependence, uncomplicated: Secondary | ICD-10-CM

## 2023-12-14 MED ORDER — IBUPROFEN 200 MG PO TABS
600.0000 mg | ORAL_TABLET | Freq: Four times a day (QID) | ORAL | Status: DC | PRN
  Administered 2023-12-15: 600 mg via ORAL
  Filled 2023-12-14: qty 3

## 2023-12-14 MED ORDER — ACETAMINOPHEN 325 MG PO TABS
650.0000 mg | ORAL_TABLET | Freq: Once | ORAL | Status: AC
Start: 2023-12-14 — End: 2023-12-14
  Administered 2023-12-14: 650 mg via ORAL
  Filled 2023-12-14: qty 2

## 2023-12-14 MED ORDER — TRAMADOL HCL 50 MG PO TABS
50.0000 mg | ORAL_TABLET | Freq: Once | ORAL | Status: AC
  Administered 2023-12-14: 50 mg via ORAL
  Filled 2023-12-14: qty 1

## 2023-12-14 MED ORDER — DIPHENHYDRAMINE-ZINC ACETATE 2-0.1 % EX CREA
TOPICAL_CREAM | Freq: Every day | CUTANEOUS | Status: DC | PRN
  Filled 2023-12-14: qty 28

## 2023-12-14 NOTE — ED Notes (Addendum)
 Spoke with [facility], patient states he can not walk without crutches and painful to walk stating that he also has nerve damage being followed by providers since January.  At this time unable to accept admission to [facility] per nurse.

## 2023-12-14 NOTE — Progress Notes (Signed)
 LCSW Progress Note  [id number]   Mark Hammond  12/14/2023  2:08 PM  Description:   Inpatient Psychiatric Referral  Patient was recommended inpatient per [name] NP. There are no available beds at [facility], per [facility] [name]. Patient was referred to the following out of network facilities:   Destination  Service Provider Address Phone [facility] [address] [place] [address] [phone]  [facility] [address] [place] [address] [phone]  [facility] [address] [place] [address] [phone]  [facility] [address] [place], [place] [address] [phone]  [facility] [address] [place] [address] [phone]  [facility] [address] [place] [address] [phone]  [facility] [address] [place] [address] [phone]  [facility] [address] [place] [address] [phone]  [facility] [address], [place] [address] [phone]  [facility] [address] [name] [place] [phone]  [facility] [address] [place] [address] [phone]  [facility] [address], [place] [address] [phone]      Situation ongoing, CSW to continue following and update chart as more information becomes available.      [name], MSW, LCSW  12/14/2023 2:08 PM

## 2023-12-14 NOTE — Consult Note (Signed)
 [facility] Consult Follow-up  Patient Name: .Koty Anctil  MRN: [id number]  DOB: Aug 15, 1991  Consult Order details:  Orders (From admission, onward)     Start     Ordered   12/13/23 1743  CONSULT TO CALL [facility] TEAM       Ordering Provider: Charlynne Pander, MD  Provider:  (Not yet assigned)  Question Answer Comment  Place call to: ACT   Reason for Consult Admit      12/13/23 1742             Mode of Visit: In person    Psychiatry Consult Evaluation  Service Date: December 14, 2023 LOS:  LOS: 0 days  Chief Complaint suicidal ideation and depression  Primary Psychiatric Diagnoses  Schizoaffective disorder 2.  Polysubstance abuse 3.  Suicidal ideations  Assessment  Erminio Nygard is a 33 y.o. male admitted: Presented to the ED on 12/13/2023  5:16 PM for suicidal ideations and depression. He carries the psychiatric diagnoses of schizoaffective disorder, polysubstance abuse and anxiety and has a past medical history of neuropathy of left leg.    [name], 33 y.o., male patient seen face to face by this provider, consulted with Dr. Enedina Finner; and chart reviewed on 12/14/23.  On evaluation Jaree Trinka reports that he has been using illicit substances due to his depression about the way his life has turned out.  Patient states that he recently has been using alcohol, stating that he drinks about 4-40 ounce beers a day, also states that he has been occasionally using methamphetamines, crack cocaine, and fentanyl.  He states that he has developed neuropathy in his left leg, states that the pain has been chronic and severe, states he has a hard time getting around like he used to which is also contributing to his depression and suicidal thoughts.  The patient states that he currently lives with his uncle off and on, states that he was recently sent to [facility] in December, states he was locked up in jail January 12 for a few days due to trespassing.  States that he feels he needs to  be admitted to an inpatient facility to keep himself safe from drugs, and to keep himself safe from himself.  During evaluation patient is sitting up in gurney and appears to be in no distress.  He is alert and oriented x 4, cooperative, his mood is pleasant, patient also has a sadness about him, affect is congruent with mood.  He has normal speech and appropriate behavior. Objectively there is no evidence of psychosis/mania or delusional thinking.  Patient is able to converse coherently, goal directed thoughts, no distractibility, or pre-occupation.  Patient continues to endorse suicidal thoughts with a plan to overdose with pills or to just drink himself to death with alcohol.  He denies homicidal ideation, psychosis, and paranoia.  Patient answered question appropriately.      Diagnoses:  Active Hospital problems: Principal Problem:   Polysubstance dependence (HCC) Active Problems:   MDD (major depressive disorder)    Plan   ## Psychiatric Medication Recommendations:  Continue patient on home medications  ## Medical Decision Making Capacity: Not specifically addressed in this encounter  ## Further Work-up:  -- Continue to monitor patient's liver function test EKG, While pt on Qtc prolonging medications, please monitor & replete K+ to 4 and Mg2+ to 2, or UDS -- most recent EKG on 12/04/23 had QtC of 478.  Ordered an updated EKG on patient due to patient QTc  being 4 7810 days ago -- Pertinent labwork reviewed earlier this admission includes: CBC, CMP, UDS, UA, EKG   ## Disposition:-- We recommend inpatient psychiatric hospitalization after medical hospitalization. Patient has been involuntarily committed on 12/13/23.   ## Behavioral / Environmental: -To minimize splitting of staff, assign one staff person to communicate all information from the team when feasible. or Utilize compassion and acknowledge the patient's experiences while setting clear and realistic expectations for  care.    ## Safety and Observation Level:  - Based on my clinical evaluation, I estimate the patient to be at low risk of self harm in the current setting. - At this time, we recommend  routine. This decision is based on my review of the chart including patient's history and current presentation, interview of the patient, mental status examination, and consideration of suicide risk including evaluating suicidal ideation, plan, intent, suicidal or self-harm behaviors, risk factors, and protective factors. This judgment is based on our ability to directly address suicide risk, implement suicide prevention strategies, and develop a safety plan while the patient is in the clinical setting. Please contact our team if there is a concern that risk level has changed.  CSSR Risk Category:C-SSRS RISK CATEGORY: No Risk  Suicide Risk Assessment: Patient has following modifiable risk factors for suicide: active suicidal ideation, recklessness, and medication noncompliance, which we are addressing by recommending inpatient psychiatric admission. Patient has following non-modifiable or demographic risk factors for suicide: male gender, history of suicide attempt, history of self harm behavior, and psychiatric hospitalization Patient has the following protective factors against suicide: Supportive friends  Thank you for this consult request. Recommendations have been communicated to the primary team.  We will recommend inpatient psychiatric admission and continue to follow patient at this time.   Alona Bene, PMHNP       History of Present Illness  Relevant Aspects of Hospital ED Course:  Admitted on 12/13/2023 for suicidal ideation and depression.  Patient Report:  [name], 33 y.o., male patient seen face to face by this provider, consulted with Dr. Enedina Finner; and chart reviewed on 12/14/23.  On evaluation Dong Nimmons reports that he has been using illicit substances due to his depression about the  way his life has turned out.  Patient states that he recently has been using alcohol, stating that he drinks about 4-40 ounce beers a day, also states that he has been occasionally using methamphetamines, crack cocaine, and fentanyl.  He states that he has developed neuropathy in his left leg, states that the pain has been chronic and severe, states he has a hard time getting around like he used to which is also contributing to his depression and suicidal thoughts.  The patient states that he currently lives with his uncle off and on, states that he was recently sent to [facility] in December, states he was locked up in jail January 12 for a few days due to trespassing.  States that he feels he needs to be admitted to an inpatient facility to keep himself safe from drugs, and to keep himself safe from himself.  During evaluation patient is sitting up in gurney and appears to be in no distress.  He is alert and oriented x 4, cooperative, his mood is pleasant, patient also has a sadness about him, affect is congruent with mood.  He has normal speech and appropriate behavior. Objectively there is no evidence of psychosis/mania or delusional thinking.  Patient is able to converse coherently, goal directed thoughts, no  distractibility, or pre-occupation.  Patient continues to endorse suicidal thoughts with a plan to overdose with pills or to just drink himself to death with alcohol.  He denies homicidal ideation, psychosis, and paranoia.   Psych ROS:  Depression: Endorses Anxiety: Endorses Mania (lifetime and current): Denies Psychosis: (lifetime and current): Denies  Collateral information:  None at this time  ROS   Psychiatric and Social History  Psychiatric History:  Information collected from patient and chart  Prev Dx/Sx: Depression and polysubstance abuse Current Psych Provider: None Home Meds (current): See above Previous Med Trials: Unknown Therapy: None  Prior Psych Hospitalization:  Yes Prior Self Harm: Yes Prior Violence: Yes  Family Psych History: Denies Family Hx suicide: Denies  Social History:  Developmental Hx: Deferred Educational Hx: Patient graduated high school Occupational Hx: Unemployed Legal Hx: Denies Living Situation: Lives with uncle off and on Spiritual Hx: Yes Access to weapons/lethal means: Denies  Substance History Alcohol: Yes Type of alcohol beer and liquor Last Drink yesterday Number of drinks per day 4 History of alcohol withdrawal seizures denies History of DT's denies Tobacco: Yes Illicit drugs: Yes Prescription drug abuse: Yes Rehab hx: Yes  Exam Findings  Physical Exam:  Vital Signs:  Temp:  [97.9 F (36.6 C)-99.3 F (37.4 C)] 99.3 F (37.4 C) (03/13 1656) Pulse Rate:  [76-105] 76 (03/13 1656) Resp:  [16-18] 16 (03/13 1656) BP: (106-113)/(57-69) 113/69 (03/13 1656) SpO2:  [98 %-100 %] 100 % (03/13 1656) Blood pressure 113/69, pulse 76, temperature 99.3 F (37.4 C), temperature source Oral, resp. rate 16, height 5\' 4"  (1.626 m), weight 62 kg, SpO2 100%. Body mass index is 23.46 kg/m.  Physical Exam  Mental Status Exam: General Appearance: Disheveled  Orientation:  Full (Time, Place, and Person)  Memory:  Immediate;   Fair Remote;   Fair  Concentration:  Concentration: Fair and Attention Span: Fair  Recall:  Fair  Attention  Fair  Eye Contact:  Good  Speech:  Clear and Coherent  Language:  Good  Volume:  Normal  Mood: Depressed  Affect: Appropriate  Thought Process: Coherent  Thought Content: Logical  Suicidal Thoughts: Yes with plan  Homicidal Thoughts: No  Judgement: Poor  Insight: Poor  Psychomotor Activity: Normal  Akathisia: No  Fund of Knowledge: Fair      Assets:  Manufacturing systems engineer Desire for Improvement Social Support  Cognition:  WNL  ADL's:  Intact  AIMS (if indicated):        Other History   These have been pulled in through the EMR, reviewed, and updated if appropriate.   Family History:  The patient's family history includes Stroke in an other family member.  Medical History: Past Medical History:  Diagnosis Date   Alcoholic gastritis    Bipolar 1 disorder (HCC)    Dental abscess    Depression    Drug abuse (HCC)    ETOH abuse    Hallucination    Schizo affective schizophrenia [facility])     Surgical History: History reviewed. No pertinent surgical history.   Medications:   Current Facility-Administered Medications:    acetaminophen (TYLENOL) tablet 650 mg, 650 mg, Oral, Q6H PRN, Charlynne Pander, MD, 650 mg at 12/14/23 1322   diphenhydrAMINE (BENADRYL) injection 50 mg, 50 mg, Intramuscular, Q6H PRN, Deaton, Thresa Ross, MD   diphenhydrAMINE-zinc acetate (BENADRYL) 2-0.1 % cream, , Topical, Daily PRN, Lorre Nick, MD   gabapentin (NEURONTIN) capsule 300 mg, 300 mg, Oral, TID, Deaton, Thresa Ross, MD, 300 mg at 12/14/23  1506   hydrOXYzine (ATARAX) tablet 50 mg, 50 mg, Oral, Q6H PRN, Deaton, Thresa Ross, MD   LORazepam (ATIVAN) injection 2 mg, 2 mg, Intramuscular, Q6H PRN, Deaton, Thresa Ross, MD   QUEtiapine (SEROQUEL) tablet 100 mg, 100 mg, Oral, QHS, Deaton, Thresa Ross, MD, 100 mg at 12/13/23 2137   ziprasidone (GEODON) injection 10 mg, 10 mg, Intramuscular, Q6H PRN, Deaton, Thresa Ross, MD  Current Outpatient Medications:    acetaminophen (TYLENOL) 325 MG tablet, Take 2 tablets (650 mg total) by mouth every 6 (six) hours as needed for mild pain (pain score 1-3)., Disp: , Rfl:    gabapentin (NEURONTIN) 300 MG capsule, Take 2 capsules (600 mg total) by mouth 3 (three) times daily., Disp: 180 capsule, Rfl: 0   hydrOXYzine (ATARAX) 25 MG tablet, Take 1 tablet (25 mg total) by mouth every 6 (six) hours as needed for anxiety., Disp: 30 tablet, Rfl: 0   Multiple Vitamin (MULTIVITAMIN WITH MINERALS) TABS tablet, Take 1 tablet by mouth daily., Disp: , Rfl:    nicotine polacrilex (NICORETTE) 2 MG gum, Take 1 each (2 mg total) by mouth as needed for smoking cessation.,  Disp: 100 tablet, Rfl: 0   QUEtiapine (SEROQUEL) 100 MG tablet, Take 1 tablet (100 mg total) by mouth at bedtime., Disp: 30 tablet, Rfl: 0   thiamine (VITAMIN B-1) 100 MG tablet, Take 1 tablet (100 mg total) by mouth daily., Disp: , Rfl:   Allergies: Allergies  Allergen Reactions   Codeine Other (See Comments)    Patient states parents told him he was allergic at a young age.    Alona Bene, PMHNP

## 2023-12-14 NOTE — ED Notes (Signed)
 Placed patients belonings from the floor by the desk to the cabinet where [address] A cabinet. Crutches beside printer with arm bracelet attached.

## 2023-12-14 NOTE — ED Notes (Signed)
 Spoke with Pervis Hocking RN to give report, confirms they can receive pt as long as he is confident in his ability to transfer himself in and out of wheelchair. Pt agreeable to this. Report given.

## 2023-12-14 NOTE — ED Notes (Signed)
 Pt provided with urinal

## 2023-12-14 NOTE — Progress Notes (Signed)
 Pt has been accepted to [facility] TOMORROW 12/15/2023 Bed assignment: Main campus  Pt meets inpatient criteria per: [name] NP  Attending Physician will be Loni Beckwith, MD  Report can be called to: [phone] (this is a pager, please leave call-back number when giving report)  Pt can arrive after 8 AM  Care Team Notified: Alona Bene, PMHNP Jacquelynn Cree NT,  Hollice Espy RN    [place] Ailene Royal LCSW-A   12/14/2023 2:19 PM

## 2023-12-14 NOTE — ED Notes (Signed)
 Spoke with sheriff's office to arrange transport, they are unable to transport today due to staffing issues so pt has been placed on list for transport first thing in the morning. Called [facility] [place] and left message on pager line to let them know.

## 2023-12-14 NOTE — ED Provider Notes (Signed)
 Emergency Medicine Observation Re-evaluation Note  Mark Hammond is a 33 y.o. male, seen on rounds today.  Pt initially presented to the ED for complaints of IVC Currently, the patient is resting comfortably.  Physical Exam  BP (!) 106/57 (BP Location: Left Arm)   Pulse (!) 105   Temp 98.5 F (36.9 C) (Oral)   Resp 16   Ht 5\' 4"  (1.626 m)   Wt 62 kg   SpO2 98%   BMI 23.46 kg/m  Physical Exam Vitals and nursing note reviewed.  Constitutional:      General: He is not in acute distress.    Appearance: He is well-developed.  HENT:     Head: Normocephalic and atraumatic.  Eyes:     Conjunctiva/sclera: Conjunctivae normal.  Cardiovascular:     Rate and Rhythm: Normal rate and regular rhythm.     Heart sounds: No murmur heard. Pulmonary:     Effort: Pulmonary effort is normal. No respiratory distress.  Musculoskeletal:        General: No swelling.     Cervical back: Neck supple.  Skin:    General: Skin is warm and dry.  Neurological:     Mental Status: He is alert.  Psychiatric:        Mood and Affect: Mood normal.      ED Course / MDM  EKG:   I have reviewed the labs performed to date as well as medications administered while in observation.  Recent changes in the last 24 hours include TTS evaluation, recommending inpatient placement.  Plan  Current plan is for psychiatric placement.    Glendora Score, MD 12/14/23 [phone]

## 2023-12-15 ENCOUNTER — Other Ambulatory Visit: Payer: Self-pay

## 2023-12-15 ENCOUNTER — Emergency Department (HOSPITAL_COMMUNITY): Payer: MEDICAID

## 2023-12-15 NOTE — ED Notes (Signed)
 Patient resting in bed breathing eyes closed

## 2023-12-15 NOTE — ED Notes (Signed)
 Patient resting in bed breathing eyes closed with sitter at bedside

## 2023-12-15 NOTE — ED Notes (Signed)
 Report given to Burnis Medin

## 2023-12-15 NOTE — ED Notes (Signed)
 Talked with [facility] department patient is on list for transport.

## 2023-12-15 NOTE — ED Notes (Signed)
 Paged [facility] waiting for call back

## 2024-01-05 ENCOUNTER — Ambulatory Visit (HOSPITAL_COMMUNITY)
Admission: EM | Admit: 2024-01-05 | Discharge: 2024-01-05 | Disposition: A | Discharge status: Home / Self Care | Payer: MEDICAID | Attending: Family | Admitting: Family

## 2024-01-05 ENCOUNTER — Emergency Department (HOSPITAL_COMMUNITY)
Admission: EM | Admit: 2024-01-05 | Discharge: 2024-01-06 | Disposition: A | Discharge status: Home / Self Care | Payer: MEDICAID | Attending: Emergency Medicine | Admitting: Emergency Medicine

## 2024-01-05 ENCOUNTER — Encounter (HOSPITAL_COMMUNITY): Payer: Self-pay

## 2024-01-05 ENCOUNTER — Other Ambulatory Visit: Payer: Self-pay

## 2024-01-05 DIAGNOSIS — R609 Edema, unspecified: Secondary | ICD-10-CM

## 2024-01-05 DIAGNOSIS — M79605 Pain in left leg: Secondary | ICD-10-CM | POA: Diagnosis present

## 2024-01-05 DIAGNOSIS — R202 Paresthesia of skin: Secondary | ICD-10-CM | POA: Diagnosis not present

## 2024-01-05 DIAGNOSIS — M79652 Pain in left thigh: Secondary | ICD-10-CM | POA: Diagnosis not present

## 2024-01-05 DIAGNOSIS — M25552 Pain in left hip: Secondary | ICD-10-CM | POA: Diagnosis not present

## 2024-01-05 DIAGNOSIS — R600 Localized edema: Secondary | ICD-10-CM | POA: Insufficient documentation

## 2024-01-05 DIAGNOSIS — F1920 Other psychoactive substance dependence, uncomplicated: Secondary | ICD-10-CM

## 2024-01-05 DIAGNOSIS — F259 Schizoaffective disorder, unspecified: Secondary | ICD-10-CM | POA: Insufficient documentation

## 2024-01-05 DIAGNOSIS — Z91148 Patient's other noncompliance with medication regimen for other reason: Secondary | ICD-10-CM | POA: Insufficient documentation

## 2024-01-05 DIAGNOSIS — Z5901 Sheltered homelessness: Secondary | ICD-10-CM | POA: Insufficient documentation

## 2024-01-05 DIAGNOSIS — Z5900 Homelessness unspecified: Secondary | ICD-10-CM

## 2024-01-05 LAB — CBG MONITORING, ED: Glucose-Capillary: 113 mg/dL — ABNORMAL HIGH (ref 70–99)

## 2024-01-05 MED ORDER — CYCLOBENZAPRINE HCL 10 MG PO TABS
5.0000 mg | ORAL_TABLET | Freq: Once | ORAL | Status: AC
  Administered 2024-01-05: 5 mg via ORAL
  Filled 2024-01-05: qty 1

## 2024-01-05 MED ORDER — KETOROLAC TROMETHAMINE 30 MG/ML IJ SOLN
30.0000 mg | Freq: Once | INTRAMUSCULAR | Status: AC
  Administered 2024-01-05: 30 mg via INTRAMUSCULAR
  Filled 2024-01-05: qty 1

## 2024-01-05 NOTE — ED Provider Notes (Signed)
 Behavioral Health Urgent Care Medical Screening Exam  Patient Name: Mark Hammond MRN: [id number] Date of Evaluation: 01/05/24 Chief Complaint:   Diagnosis:  Final diagnoses:  Polysubstance (including opioids) dependence, daily use (HCC)  Homeless    History of Present illness: Mark Hammond is a 33 y.o. male.  Patient presents voluntarily to [facility] behavioral health for walk-in assessment.  Patient is assessed by this nurse practitioner face-to-face.  Patient prefers that he has "uncle" previous therapeutic caregiver, Mark Hammond remain present during assessment.  Patient is reclined in assessment area.  Patient is alert and oriented, pleasant and cooperative during assessment.  Patient presents with euthymic mood, congruent affect.  Patient is appropriately groomed, fair eye contact.    [place] reports relapsed on illicit opiates and alcohol yesterday after 28 days sobriety.  2 episodes of vomiting.  Patient states "used $20 worth" of hydrocodone, 4 tablets, and 2 forty-ounce beers on yesterday.  Patient discharged from [facility] behavioral health facility based crisis on 12/12/2023.  Remained abstinent until yesterday.  Denies substance use aside from opioids and alcohol.  [facility] and caregiver, Mark Hammond, report they are currently working alongside [employer] coordination and attempt to place Mark Hammond in an assisted living type facility.  Mark Hammond reports recently homeless.  Mark Hammond briefly residing with Mark Hammond however unable to continue to reside at [place] home.  Mark Hammond reportedly attempted to seek shelter at 2 homeless shelters today, patient reportedly unable to reside at shelter related to his need for a walker to assist with ambulation.  Patient reports his left leg became weak on January 16, no injury, has required walker since that time. Upcoming appts with orthopedic provider and neurology.   Mark Hammond states "can I just stay here while I wait for assisted living, can I do detox  again?"  Explained detox not required at this time given 28-day sobriety, patient and caregiver verbalize understanding.  Per chart review patient diagnoses include bipolar 1 disorder, major depressive disorder, schizophrenia, schizoaffective disorder and polysubstance use disorders.  Mark Hammond is not linked with outpatient psychiatry currently.  Reports currently in process of seeking new outpatient psychiatry provider.  Not compliant with medications currently.  Reports history of multiple previous inpatient psychiatric hospitalizations.  09/2023 behavioral health admission at [facility] 01/2023 and  02/2023 behavioral health admission [place].   Patient denies suicidal and homicidal ideation.  He easily contracts verbally for safety at this time.  He denies auditory and visual hallucinations.  There is no evidence of delusional thought content no indication that patient is responding to internal stimuli.  Patient recently homeless and staying with a previous caregiver briefly.  He denies access to weapons.  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   Patient offered support and encouragement.   Patient and family are educated and verbalize understanding of mental health resources and other crisis services in the community. They are instructed to call 911 and present to the nearest emergency room should patient experience any suicidal/homicidal ideation, auditory/visual/hallucinations, or detrimental worsening of mental health condition.       Flowsheet Row ED from 01/05/2024 in [facility] ED from 12/13/2023 in [facility] Emergency Department at [facility] ED from 12/08/2023 in [facility]  C-SSRS RISK CATEGORY No Risk No Risk No Risk       Psychiatric Specialty Exam  Presentation  General Appearance:Casual  Eye Contact:Good  Speech:Clear and Coherent; Normal  Rate  Speech Volume:Normal  Handedness:Right   Mood and Affect  Mood: Euthymic  Affect: Appropriate; Congruent   Thought Process  Thought Processes: Coherent; Goal Directed; Linear  Descriptions of Associations:Intact  Orientation:Full (Time, Place and Person)  Thought Content:Logical; WDL  Diagnosis of Schizophrenia or Schizoaffective disorder in past: No   Hallucinations:None whispers and voices denies any today  Ideas of Reference:None  Suicidal Thoughts:No Without Plan Without Intent; Without Plan; Without Access to Means; Without Means to Carry Out  Homicidal Thoughts:No Without Intent; Without Plan (contracts for safety)   Sensorium  Memory: Immediate Good; Recent Good  Judgment: Fair  Insight: Fair   Executive Functions  Concentration: Good  Attention Span: Good  Recall: Good  Fund of Knowledge: Good  Language: Good   Psychomotor Activity  Psychomotor Activity: Normal   Assets  Assets: Communication Skills; Desire for Improvement; Financial Resources/Insurance; Leisure Time; Resilience; Social Support   Sleep  Sleep: Good  Number of hours:  6   Physical Exam: Physical Exam Vitals and nursing note reviewed.  Constitutional:      Appearance: Normal appearance. He is well-developed.  HENT:     Head: Normocephalic.     Nose: Nose normal.  Cardiovascular:     Rate and Rhythm: Normal rate.  Pulmonary:     Effort: Pulmonary effort is normal.  Musculoskeletal:        General: Normal range of motion.     Cervical back: Normal range of motion.  Skin:    General: Skin is warm and dry.  Neurological:     Mental Status: He is alert and oriented to person, place, and time.  Psychiatric:        Attention and Perception: Attention and perception normal.        Mood and Affect: Mood and affect normal.        Speech: Speech normal.        Behavior: Behavior normal. Behavior is cooperative.        Thought Content:  Thought content normal.        Cognition and Memory: Cognition and memory normal.    Review of Systems  Constitutional: Negative.   HENT: Negative.    Eyes: Negative.   Respiratory: Negative.    Cardiovascular: Negative.   Gastrointestinal: Negative.   Genitourinary: Negative.   Musculoskeletal:  Positive for myalgias.       Left leg pain/weakness began 10/19/2023, no injury reported, ambulates with walker  Skin: Negative.   Neurological: Negative.   Psychiatric/Behavioral:  Positive for substance abuse.    Blood pressure 96/64, pulse 100, temperature 97.8 F (36.6 C), temperature source Oral, resp. rate 18, SpO2 99%. There is no height or weight on file to calculate BMI.  Musculoskeletal: Strength & Muscle Tone: decreased Gait & Station:  ambulates with walker Patient leans: N/A   [facility] MSE Discharge Disposition for Follow up and Recommendations: Based on my evaluation the patient does not appear to have an emergency medical condition and can be discharged with resources and follow up care in outpatient services for Medication Management, Substance Abuse Intensive Outpatient Program, and Individual Therapy Patient reviewed with attending psychiatrist, Dr. Loleta Chance. Follow-up with outpatient psychiatry, resources provided.  Follow-up with primary care, neurology and orthopedic providers as scheduled. Follow-up with substance use treatment resources provided.  Lenard Lance, FNP 01/05/2024, 5:20 PM

## 2024-01-05 NOTE — Progress Notes (Signed)
   01/05/24 1540  [facility] Triage Screening (Walk-ins at [facility] only)  How Did You Hear About [place]? Family/Friend  What Is the Reason for Your Visit/Call Today? Pt is a 33 year old male presenting to [facility] accompanied with his uncle. Pt reports he is here seeking detox again for the same issue as last time. Pt reports that he relapsed on pain pills and alcohol yesterday. Pt reports that he had 2- 40 oz beers and mixed it with pain pills.  Pt reports that he was sober for 23 days. Pt is currently living with his uncle. Pt mentions he has been drinking for several years and drinks daily. Pt reports he feels like he is withdrawing from alcohol at this time. Pt denies SI, Hi and Avh.  How Long Has This Been Causing You Problems? 1 wk - 1 month  Have You Recently Had Any Thoughts About Hurting Yourself? No  How long ago did you have thoughts about hurting yourself? Denies SI  Are You Planning to Commit Suicide/Harm Yourself At This time? No  Have you Recently Had Thoughts About Hurting Someone [name]? No  Are You Planning To Harm Someone At This Time? No  Explanation: Denies HI  Physical Abuse Denies  Verbal Abuse Denies  Sexual Abuse Denies  Exploitation of patient/patient's resources Denies  Self-Neglect Denies  Are you currently experiencing any auditory, visual or other hallucinations? No  Please explain the hallucinations you are currently experiencing: Denies AVH  Have You Used Any Alcohol or Drugs in the Past 24 Hours? Yes  What Did You Use and How Much? Pain Pills and 2 - 40oz beers  Do you have any current medical co-morbidities that require immediate attention? No  Clinician description of patient physical appearance/behavior: Pt was cooperative  What Do You Feel Would Help You the Most Today? Alcohol or Drug Use Treatment  If access to [facility] was not available, would you have sought care in the Emergency Department? Yes  Determination of Need Routine (7 days)  Options For Referral  Chemical Dependency Intensive Outpatient Therapy (CDIOP);Facility-Based Crisis;Outpatient Therapy    Flowsheet Row ED from 01/05/2024 in [facility] ED from 12/13/2023 in [facility] Emergency Department at [facility] ED from 12/08/2023 in [facility]  C-SSRS RISK CATEGORY No Risk No Risk No Risk

## 2024-01-05 NOTE — Discharge Instructions (Addendum)

## 2024-01-05 NOTE — ED Triage Notes (Signed)
 Pt endorses drinking beer earlier around 2pm, denies drug use.

## 2024-01-05 NOTE — ED Provider Notes (Signed)
 [facility] EMERGENCY DEPARTMENT AT [facility] Provider Note   CSN: [id number] Arrival date & time: 01/05/24  2200     History  Chief Complaint  Patient presents with   Leg Pain   Weakness    Mark Hammond is a 33 y.o. male.  HPI     This is a 33 year old male who presents with leg pain left greater than right.  This is a chronic issue.  States that has been ongoing since January.  Denies any injury at that time.  States that he has pain mostly on the left thigh and into the buttock.  He is now walking with a walker and has also walk with crutches.  Patient is also complaining of tingling in the fingers.  History of polysubstance abuse reports drinking beer earlier today.  Was also previously seen at behavioral health urgent care.  Previous workup as he has been seen for the same complaint multiple times includes an MRI of the lumbar spine in February.  Home Medications Prior to Admission medications   Medication Sig Start Date End Date Taking? Authorizing Provider  methylPREDNISolone (MEDROL DOSEPAK) 4 MG TBPK tablet Take as directed on packet 01/06/24  Yes Ajanay Farve, Mayer Masker, MD  acetaminophen (TYLENOL) 325 MG tablet Take 2 tablets (650 mg total) by mouth every 6 (six) hours as needed for mild pain (pain score 1-3). 11/26/23   Carlyn Reichert, MD  gabapentin (NEURONTIN) 300 MG capsule Take 2 capsules (600 mg total) by mouth 3 (three) times daily. 12/12/23 01/11/24  Carrion-Carrero, Karle Starch, MD  hydrOXYzine (ATARAX) 25 MG tablet Take 1 tablet (25 mg total) by mouth every 6 (six) hours as needed for anxiety. 11/26/23   Carlyn Reichert, MD  Multiple Vitamin (MULTIVITAMIN WITH MINERALS) TABS tablet Take 1 tablet by mouth daily. 12/13/23   Carrion-Carrero, Karle Starch, MD  nicotine polacrilex (NICORETTE) 2 MG gum Take 1 each (2 mg total) by mouth as needed for smoking cessation. 12/12/23   Carrion-Carrero, Karle Starch, MD  QUEtiapine (SEROQUEL) 100 MG tablet Take 1 tablet (100 mg total) by  mouth at bedtime. 12/12/23   Carrion-Carrero, Karle Starch, MD  thiamine (VITAMIN B-1) 100 MG tablet Take 1 tablet (100 mg total) by mouth daily. 12/13/23   Carrion-Carrero, Karle Starch, MD      Allergies    Codeine    Review of Systems   Review of Systems  Constitutional:  Negative for fever.  Musculoskeletal:        Leg pain  Neurological:  Positive for numbness. Negative for weakness.  All other systems reviewed and are negative.   Physical Exam Updated Vital Signs BP 104/67   Pulse 95   Temp 98.4 F (36.9 C) (Oral)   Resp 18   Ht 1.626 m (5\' 4" )   Wt 61.7 kg   SpO2 98%   BMI 23.34 kg/m  Physical Exam Vitals and nursing note reviewed.  Constitutional:      Appearance: He is well-developed.     Comments: Disheveled appearing but nontoxic  HENT:     Head: Normocephalic and atraumatic.  Eyes:     Pupils: Pupils are equal, round, and reactive to light.  Cardiovascular:     Rate and Rhythm: Normal rate and regular rhythm.     Heart sounds: No murmur heard. Pulmonary:     Effort: Pulmonary effort is normal. No respiratory distress.  Abdominal:     Palpations: Abdomen is soft.     Tenderness: There is no abdominal tenderness. There is no rebound.  Musculoskeletal:     Cervical back: Neck supple.     Comments: Pain with range of motion of the left hip, patient will not cooperate with exam secondary to pain.  He has 2+ DP pulses bilaterally, flexion and extension of the feet intact, no clonus  Lymphadenopathy:     Cervical: No cervical adenopathy.  Skin:    General: Skin is warm and dry.  Neurological:     Mental Status: He is alert and oriented to person, place, and time.  Psychiatric:        Mood and Affect: Mood normal.     ED Results / Procedures / Treatments   Labs (all labs ordered are listed, but only abnormal results are displayed) Labs Reviewed  BASIC METABOLIC PANEL WITH GFR - Abnormal; Notable for the following components:      Result Value   Sodium 131 (*)     Potassium 3.4 (*)    Chloride 96 (*)    Glucose, Bld 118 (*)    Creatinine, Ser 0.51 (*)    Calcium 8.6 (*)    All other components within normal limits  CBC - Abnormal; Notable for the following components:   RBC 3.56 (*)    Hemoglobin 10.7 (*)    HCT 32.3 (*)    All other components within normal limits  CBG MONITORING, ED - Abnormal; Notable for the following components:   Glucose-Capillary 113 (*)    All other components within normal limits  URINALYSIS, ROUTINE W REFLEX MICROSCOPIC  CK    EKG None  Radiology MR LUMBAR SPINE WO CONTRAST Result Date: 01/06/2024 CLINICAL DATA:  33 year old male with neurologic deficit, bilateral lower extremity pain, upper extremity numbness. Weakness. Vomiting. Diaphoresis. Study was planned without and with contrast, but the patient declined contrast administration. EXAM: MRI LUMBAR SPINE WITHOUT CONTRAST TECHNIQUE: Multiplanar, multisequence MR imaging of the lumbar spine was performed. No intravenous contrast was administered. COMPARISON:  MRI lumbar spine 11/20/2023. FINDINGS: Unfortunately the axial imaging today is substantially motion degraded, especially the axial T2, and is largely nondiagnostic. Segmentation: Lumbar segmentation appears to be normal as designated previously. Alignment:  Stable lumbar lordosis.  No spondylolisthesis. Vertebrae: Stable vertebral height. Visible bone marrow signal is within normal limits. No marrow edema or evidence of acute osseous abnormality. Conus medullaris and cauda equina: Conus appears normal at the T12-L1 level on sagittal images. Visible lower thoracic spinal cord appears to remain normal. Paraspinal and other soft tissues: Abnormal but nonspecific patchy bilateral lower lumbar paraspinal muscle STIR hyperintensity (series 7, images 7 and 13), L4 and L5 levels predominantly. The visible psoas muscle seem to remain normal on the sagittal images today and no paraspinal fluid collection is evident. Grossly  negative visible abdominal viscera. Disc levels: Not evaluated on the axial imaging today due to motion artifact. On sagittal images the disc spaces appears stable and within normal limits. No disc herniation, spinal stenosis or neural foraminal stenosis is identified. IMPRESSION: 1. Limited evaluation today due to largely nondiagnostic axial imaging. 2. New abnormal lower lumbar erector spinae muscle signal which could be inflammatory versus posttraumatic muscle edema. No paraspinal fluid collection is evident. 3. Otherwise sagittal noncontrast lumbar spine images appears stable to that in February, normal for age. Electronically Signed   By: Odessa Fleming M.D.   On: 01/06/2024 04:40   MR BRAIN WO CONTRAST Result Date: 01/06/2024 CLINICAL DATA:  33 year old male with neurologic deficit, bilateral lower extremity pain, upper extremity numbness. Weakness. Vomiting. Diaphoresis. Exam was planned  to be without and with contrast, but the patient declined IV contrast administration. EXAM: MRI HEAD WITHOUT CONTRAST TECHNIQUE: Multiplanar, multiecho pulse sequences of the brain and surrounding structures were obtained without intravenous contrast. COMPARISON:  Head CT 06/15/2023 and earlier. FINDINGS: Brain: Normal cerebral volume. No restricted diffusion to suggest acute infarction. No midline shift, mass effect, evidence of mass lesion, ventriculomegaly, extra-axial collection or acute intracranial hemorrhage. Cervicomedullary junction and pituitary are within normal limits. Wallace Cullens and white matter signal is within normal limits throughout the brain. No cortical encephalomalacia or chronic cerebral blood products identified. Deep gray nuclei, brainstem and cerebellum appear normal. Vascular: Major intracranial vascular flow voids are preserved. There is mild generalized intracranial artery tortuosity. Skull and upper cervical spine: Negative visible cervical spine. Visualized bone marrow signal is within normal limits.  Sinuses/Orbits: Coronal T2 weighted imaging not obtained. Orbits and paranasal sinuses appear unremarkable. Other: Mastoids are well aerated. Grossly normal visible internal auditory structures. Visible scalp and face appear negative. IMPRESSION: Normal noncontrast MRI appearance of the brain. Electronically Signed   By: Odessa Fleming M.D.   On: 01/06/2024 04:34    Procedures Procedures    Medications Ordered in ED Medications  ketorolac (TORADOL) 30 MG/ML injection 30 mg (30 mg Intramuscular Given 01/05/24 2356)  cyclobenzaprine (FLEXERIL) tablet 5 mg (5 mg Oral Given 01/05/24 2357)    ED Course/ Medical Decision Making/ A&P Clinical Course as of 01/06/24 0503  Sat Jan 06, 2024  0052 Per nursing, patient ambulating mostly with walker.  Would not bear much weight on the left leg.  Some improvement with pain medication now 4 out of 10.  No obvious foot drop. [CH]    Clinical Course User Index [CH] Ruthanne Mcneish, Mayer Masker, MD                                 Medical Decision Making Amount and/or Complexity of Data Reviewed Labs: ordered. Radiology: ordered.  Risk Prescription drug management.   This patient presents to the ED for concern of leg pain, this involves an extensive number of treatment options, and is a complaint that carries with it a high risk of complications and morbidity.  I considered the following differential and admission for this acute, potentially life threatening condition.  The differential diagnosis includes muscle strain, sciatica, DVT, injury, MS  MDM:    This is a 33 year old male who presents primarily with ongoing leg pain.  He is nontoxic-appearing and vital signs are reassuring.  Very limited physical exam secondary to pain.  He is currently walking with a walker.  Reports that he has follow-up with neurology.  Had an MRI in February but has had progressive worsening of symptoms.  No physical exams findings consistent with cauda equina although again quite limited.   Some of his symptoms are suggestive of sciatica but would anticipate that symptoms would have improved.  Patient is also reporting some paresthesias of the fingers.  Repeat MRI of the head and lumbar spine obtained.  Patient is at risk for epidural abscess or other spinal infection given history of drug use.  Basic lab work is largely reassuring.  MRIs are negative with the exception of some abnormal signal in the spinal muscle bilaterally.  This is of unclear significance.  Will give a Medrol Dosepak which would both treat sciatica and inflammation within the muscle.  Recommend follow-up with neurology.  (Labs, imaging, consults)  Labs: I Ordered, and  personally interpreted labs.  The pertinent results include: CBC, BMP, urinalysis  Imaging Studies ordered: I ordered imaging studies including MRI brain, MRI lumbar spine I independently visualized and interpreted imaging. I agree with the radiologist interpretation  Additional history obtained from chart review.  External records from outside source obtained and reviewed including prior evaluations  Cardiac Monitoring: The patient was maintained on a cardiac monitor.  If on the cardiac monitor, I personally viewed and interpreted the cardiac monitored which showed an underlying rhythm of: Sinus  Reevaluation: After the interventions noted above, I reevaluated the patient and found that they have :stayed the same  Social Determinants of Health:  homeless  Disposition: Discharge  Co morbidities that complicate the patient evaluation  Past Medical History:  Diagnosis Date   Alcoholic gastritis    Bipolar 1 disorder (HCC)    Dental abscess    Depression    Drug abuse (HCC)    ETOH abuse    Hallucination    Schizo affective schizophrenia (HCC)      Medicines Meds ordered this encounter  Medications   ketorolac (TORADOL) 30 MG/ML injection 30 mg   cyclobenzaprine (FLEXERIL) tablet 5 mg   methylPREDNISolone (MEDROL DOSEPAK) 4 MG  TBPK tablet    Sig: Take as directed on packet    Dispense:  21 tablet    Refill:  0    I have reviewed the patients home medicines and have made adjustments as needed  Problem List / ED Course: Problem List Items Addressed This Visit   None Visit Diagnoses       Left leg pain    -  Primary     Edema of skeletal muscle                       Final Clinical Impression(s) / ED Diagnoses Final diagnoses:  Left leg pain  Edema of skeletal muscle    Rx / DC Orders ED Discharge Orders          Ordered    methylPREDNISolone (MEDROL DOSEPAK) 4 MG TBPK tablet        01/06/24 0501              [name], Mayer Masker, MD 01/06/24 0505

## 2024-01-05 NOTE — ED Notes (Signed)
 This RN and Theodoro Grist went out to speak with pt and visitor. They were very adamant about him not being dc'd, stating that he needs the detox treatment. Staff explained he did not meet criteria for FBC. Provided resources via AVS paperwork. Pt is stable at this time and is leaving now.

## 2024-01-05 NOTE — ED Triage Notes (Signed)
 Pt reports bilateral leg pain since January 16th, seen here for same. Worse the past 2 days. Patient also reports numbness in 2 fingers on each hand radiating into wrists since using walker. Pt diaphoretic in triage. Pt reports no BM in 4 days. Pt endorses weakness. Denies CP, SOB, fever. 1 episode vomiting last night, denies nausea at this time. Pt awake and alert, A&Ox4. HR 130 in triage.

## 2024-01-06 ENCOUNTER — Emergency Department (HOSPITAL_COMMUNITY): Payer: MEDICAID

## 2024-01-06 LAB — BASIC METABOLIC PANEL WITH GFR
Anion gap: 9 (ref 5–15)
BUN: 7 mg/dL (ref 6–20)
CO2: 26 mmol/L (ref 22–32)
Calcium: 8.6 mg/dL — ABNORMAL LOW (ref 8.9–10.3)
Chloride: 96 mmol/L — ABNORMAL LOW (ref 98–111)
Creatinine, Ser: 0.51 mg/dL — ABNORMAL LOW (ref 0.61–1.24)
GFR, Estimated: >60 mL/min (ref >60)
Glucose, Bld: 118 mg/dL — ABNORMAL HIGH (ref 70–99)
Potassium: 3.4 mmol/L — ABNORMAL LOW (ref 3.5–5.1)
Sodium: 131 mmol/L — ABNORMAL LOW (ref 135–145)

## 2024-01-06 LAB — URINALYSIS, ROUTINE W REFLEX MICROSCOPIC
Bilirubin Urine: NEGATIVE
Glucose, UA: NEGATIVE mg/dL
Hgb urine dipstick: NEGATIVE
Ketones, ur: NEGATIVE mg/dL
Leukocytes,Ua: NEGATIVE
Nitrite: NEGATIVE
Protein, ur: NEGATIVE mg/dL
Specific Gravity, Urine: 1.013 (ref 1.005–1.030)
pH: 5 (ref 5.0–8.0)

## 2024-01-06 LAB — CBC
HCT: 32.3 % — ABNORMAL LOW (ref 39.0–52.0)
Hemoglobin: 10.7 g/dL — ABNORMAL LOW (ref 13.0–17.0)
MCH: 30.1 pg (ref 26.0–34.0)
MCHC: 33.1 g/dL (ref 30.0–36.0)
MCV: 90.7 fL (ref 80.0–100.0)
Platelets: 349 10*3/uL (ref 150–400)
RBC: 3.56 MIL/uL — ABNORMAL LOW (ref 4.22–5.81)
RDW: 13.1 % (ref 11.5–15.5)
WBC: 8.9 10*3/uL (ref 4.0–10.5)
nRBC: 0 % (ref 0.0–0.2)

## 2024-01-06 LAB — CK: Total CK: 116 U/L (ref 49–397)

## 2024-01-06 MED ORDER — METHYLPREDNISOLONE 4 MG PO TBPK: ORAL_TABLET | ORAL | 0 refills | Status: DC

## 2024-01-06 NOTE — ED Notes (Signed)
 Pt ambulated in hallway with walker and standby assistance. Pt mainly bearing weigh on right leg, holding up the left while walking. States pain has improved since medication but his leg is still tingling and weak, rates pain a 4/10. C. Horton made aware.

## 2024-01-06 NOTE — Discharge Instructions (Addendum)
 You were seen today for ongoing leg pain.  Your MRIs again are largely reassuring.  You do have some evidence of maybe some inflammation in the muscles of the lower back.  Take the Medrol Dosepak as prescribed and follow-up with neurology as scheduled.

## 2024-01-12 ENCOUNTER — Other Ambulatory Visit (HOSPITAL_COMMUNITY): Payer: Self-pay

## 2024-01-12 ENCOUNTER — Emergency Department (HOSPITAL_COMMUNITY): Admission: EM | Admit: 2024-01-12 | Discharge: 2024-01-14 | Payer: MEDICAID

## 2024-01-12 ENCOUNTER — Emergency Department (HOSPITAL_COMMUNITY)
Admission: EM | Admit: 2024-01-12 | Discharge: 2024-01-12 | Disposition: A | Discharge status: Home / Self Care | Payer: MEDICAID | Attending: Emergency Medicine | Admitting: Emergency Medicine

## 2024-01-12 ENCOUNTER — Other Ambulatory Visit: Payer: Self-pay

## 2024-01-12 ENCOUNTER — Encounter (HOSPITAL_COMMUNITY): Payer: Self-pay | Admitting: Emergency Medicine

## 2024-01-12 DIAGNOSIS — R440 Auditory hallucinations: Secondary | ICD-10-CM | POA: Insufficient documentation

## 2024-01-12 DIAGNOSIS — R45851 Suicidal ideations: Secondary | ICD-10-CM | POA: Insufficient documentation

## 2024-01-12 DIAGNOSIS — M549 Dorsalgia, unspecified: Secondary | ICD-10-CM | POA: Insufficient documentation

## 2024-01-12 DIAGNOSIS — F1010 Alcohol abuse, uncomplicated: Secondary | ICD-10-CM | POA: Insufficient documentation

## 2024-01-12 DIAGNOSIS — F1090 Alcohol use, unspecified, uncomplicated: Secondary | ICD-10-CM | POA: Diagnosis present

## 2024-01-12 DIAGNOSIS — Z5900 Homelessness unspecified: Secondary | ICD-10-CM | POA: Insufficient documentation

## 2024-01-12 DIAGNOSIS — F259 Schizoaffective disorder, unspecified: Secondary | ICD-10-CM | POA: Diagnosis present

## 2024-01-12 DIAGNOSIS — M79605 Pain in left leg: Secondary | ICD-10-CM | POA: Diagnosis present

## 2024-01-12 DIAGNOSIS — F1520 Other stimulant dependence, uncomplicated: Secondary | ICD-10-CM

## 2024-01-12 DIAGNOSIS — F1510 Other stimulant abuse, uncomplicated: Secondary | ICD-10-CM | POA: Insufficient documentation

## 2024-01-12 LAB — CBC WITH DIFFERENTIAL/PLATELET
Abs Immature Granulocytes: 0.04 10*3/uL (ref 0.00–0.07)
Basophils Absolute: 0.1 10*3/uL (ref 0.0–0.1)
Basophils Relative: 1 %
Eosinophils Absolute: 0.1 10*3/uL (ref 0.0–0.5)
Eosinophils Relative: 1 %
HCT: 36.1 % — ABNORMAL LOW (ref 39.0–52.0)
Hemoglobin: 11.7 g/dL — ABNORMAL LOW (ref 13.0–17.0)
Immature Granulocytes: 0 %
Lymphocytes Relative: 16 %
Lymphs Abs: 1.8 10*3/uL (ref 0.7–4.0)
MCH: 29.3 pg (ref 26.0–34.0)
MCHC: 32.4 g/dL (ref 30.0–36.0)
MCV: 90.5 fL (ref 80.0–100.0)
Monocytes Absolute: 0.7 10*3/uL (ref 0.1–1.0)
Monocytes Relative: 6 %
Neutro Abs: 8.6 10*3/uL — ABNORMAL HIGH (ref 1.7–7.7)
Neutrophils Relative %: 76 %
Platelets: 477 10*3/uL — ABNORMAL HIGH (ref 150–400)
RBC: 3.99 MIL/uL — ABNORMAL LOW (ref 4.22–5.81)
RDW: 13.6 % (ref 11.5–15.5)
WBC: 11.3 10*3/uL — ABNORMAL HIGH (ref 4.0–10.5)
nRBC: 0 % (ref 0.0–0.2)

## 2024-01-12 LAB — COMPREHENSIVE METABOLIC PANEL WITH GFR
ALT: 48 U/L — ABNORMAL HIGH (ref 0–44)
AST: 49 U/L — ABNORMAL HIGH (ref 15–41)
Albumin: 2.8 g/dL — ABNORMAL LOW (ref 3.5–5.0)
Alkaline Phosphatase: 112 U/L (ref 38–126)
Anion gap: 12 (ref 5–15)
BUN: 8 mg/dL (ref 6–20)
CO2: 26 mmol/L (ref 22–32)
Calcium: 8.5 mg/dL — ABNORMAL LOW (ref 8.9–10.3)
Chloride: 96 mmol/L — ABNORMAL LOW (ref 98–111)
Creatinine, Ser: 0.6 mg/dL — ABNORMAL LOW (ref 0.61–1.24)
GFR, Estimated: >60 mL/min (ref >60)
Glucose, Bld: 171 mg/dL — ABNORMAL HIGH (ref 70–99)
Potassium: 2.9 mmol/L — ABNORMAL LOW (ref 3.5–5.1)
Sodium: 134 mmol/L — ABNORMAL LOW (ref 135–145)
Total Bilirubin: 0.5 mg/dL (ref 0.0–1.2)
Total Protein: 7.5 g/dL (ref 6.5–8.1)

## 2024-01-12 LAB — RAPID URINE DRUG SCREEN, HOSP PERFORMED
Amphetamines: NOT DETECTED
Barbiturates: NOT DETECTED
Benzodiazepines: NOT DETECTED
Cocaine: POSITIVE — AB
Opiates: POSITIVE — AB
Tetrahydrocannabinol: POSITIVE — AB

## 2024-01-12 LAB — ETHANOL: Alcohol, Ethyl (B): 194 mg/dL — ABNORMAL HIGH (ref <10)

## 2024-01-12 MED ORDER — KETOROLAC TROMETHAMINE 15 MG/ML IJ SOLN
15.0000 mg | Freq: Once | INTRAMUSCULAR | Status: AC
  Administered 2024-01-12: 15 mg via INTRAMUSCULAR
  Filled 2024-01-12: qty 1

## 2024-01-12 MED ORDER — IBUPROFEN 800 MG PO TABS
800.0000 mg | ORAL_TABLET | Freq: Four times a day (QID) | ORAL | Status: DC | PRN
  Administered 2024-01-12 – 2024-01-13 (×3): 800 mg via ORAL
  Filled 2024-01-12 (×3): qty 1

## 2024-01-12 MED ORDER — ACETAMINOPHEN 325 MG PO TABS
650.0000 mg | ORAL_TABLET | Freq: Once | ORAL | Status: AC
Start: 2024-01-12 — End: 2024-01-12
  Administered 2024-01-12: 650 mg via ORAL
  Filled 2024-01-12: qty 2

## 2024-01-12 MED ORDER — CYCLOBENZAPRINE HCL 10 MG PO TABS
5.0000 mg | ORAL_TABLET | Freq: Once | ORAL | Status: AC
  Administered 2024-01-12: 5 mg via ORAL
  Filled 2024-01-12: qty 1

## 2024-01-12 MED ORDER — POTASSIUM CHLORIDE CRYS ER 20 MEQ PO TBCR
40.0000 meq | EXTENDED_RELEASE_TABLET | Freq: Once | ORAL | Status: AC
  Administered 2024-01-12: 40 meq via ORAL
  Filled 2024-01-12: qty 2

## 2024-01-12 MED ORDER — ACETAMINOPHEN 500 MG PO TABS
500.0000 mg | ORAL_TABLET | Freq: Four times a day (QID) | ORAL | 0 refills | Status: DC | PRN
  Filled 2024-01-12: qty 30, 8d supply, fill #0

## 2024-01-12 MED ORDER — PREDNISONE 20 MG PO TABS
40.0000 mg | ORAL_TABLET | Freq: Every day | ORAL | 0 refills | Status: DC
  Filled 2024-01-12: qty 10, 5d supply, fill #0

## 2024-01-12 NOTE — ED Triage Notes (Signed)
 Pt c/o auditory/visual hallucinations. Started new meds 5 days ago. Also c/o L leg ain since January. [facility]en [facility] for [facility].  VSS Denies Si/HI

## 2024-01-12 NOTE — ED Provider Notes (Signed)
 [place] EMERGENCY DEPARTMENT AT [facility] Provider Note   CSN: [id number] Arrival date & time: 01/12/24  1952     History  Chief Complaint  Patient presents with   Hallucinations    Mark Hammond is a 33 y.o. male.  33 year old male with past medical history of homelessness and schizoaffective disorder presenting to the emergency department today with concern for suicidal ideations.  The patient was just seen at [facility] emergency department for leg pain which is more of a chronic issue for him now.  He did not mention this there.  He states that he has been having voices in his head and that they have been telling him to kill himself.  He states that he was thinking earlier that he would overdose but does not have anything to overdose on.  He came to the ER for further evaluation regarding this.  He denies any homicidal ideations.  Denies any visual hallucinations.  He is here voluntarily.        Home Medications Prior to Admission medications   Medication Sig Start Date End Date Taking? Authorizing Provider  acetaminophen (TYLENOL) 500 MG tablet Take 1 tablet (500 mg total) by mouth every 6 (six) hours as needed. 01/12/24   Barrett, Horald Chestnut, PA-C  gabapentin (NEURONTIN) 300 MG capsule Take 2 capsules (600 mg total) by mouth 3 (three) times daily. 12/12/23 01/11/24  Carrion-Carrero, Karle Starch, MD  hydrOXYzine (ATARAX) 25 MG tablet Take 1 tablet (25 mg total) by mouth every 6 (six) hours as needed for anxiety. 11/26/23   Carlyn Reichert, MD  methylPREDNISolone (MEDROL DOSEPAK) 4 MG TBPK tablet Take as directed on packet 01/06/24   Horton, Mayer Masker, MD  Multiple Vitamin (MULTIVITAMIN WITH MINERALS) TABS tablet Take 1 tablet by mouth daily. 12/13/23   Carrion-Carrero, Karle Starch, MD  nicotine polacrilex (NICORETTE) 2 MG gum Take 1 each (2 mg total) by mouth as needed for smoking cessation. 12/12/23   Carrion-Carrero, Karle Starch, MD  predniSONE (DELTASONE) 20 MG tablet Take 2 tablets  (40 mg total) by mouth daily. 01/12/24   Barrett, Horald Chestnut, PA-C  QUEtiapine (SEROQUEL) 100 MG tablet Take 1 tablet (100 mg total) by mouth at bedtime. 12/12/23   Carrion-Carrero, Karle Starch, MD  thiamine (VITAMIN B-1) 100 MG tablet Take 1 tablet (100 mg total) by mouth daily. 12/13/23   Carrion-Carrero, Karle Starch, MD      Allergies    Codeine    Review of Systems   Review of Systems  Psychiatric/Behavioral:  Positive for hallucinations and suicidal ideas.   All other systems reviewed and are negative.   Physical Exam Updated Vital Signs BP 111/69 (BP Location: Left Arm)   Pulse (!) 109   Resp 20   SpO2 99%  Physical Exam Vitals and nursing note reviewed.   Gen: NAD Eyes: PERRL, EOMI HEENT: no oropharyngeal swelling Neck: trachea midline Resp: clear to auscultation bilaterally Card: RRR, no murmurs, rubs, or gallops Abd: nontender, nondistended Extremities: no calf tenderness, no edema Vascular: 2+ radial pulses bilaterally, 2+ DP pulses bilaterally Skin: no rashes Psyc: Odd affect but does not appear to be reacting to any internal stimuli   ED Results / Procedures / Treatments   Labs (all labs ordered are listed, but only abnormal results are displayed) Labs Reviewed  COMPREHENSIVE METABOLIC PANEL WITH GFR - Abnormal; Notable for the following components:      Result Value   Sodium 134 (*)    Potassium 2.9 (*)    Chloride 96 (*)  Glucose, Bld 171 (*)    Creatinine, Ser 0.60 (*)    Calcium 8.5 (*)    Albumin 2.8 (*)    AST 49 (*)    ALT 48 (*)    All other components within normal limits  ETHANOL - Abnormal; Notable for the following components:   Alcohol, Ethyl (B) 194 (*)    All other components within normal limits  RAPID URINE DRUG SCREEN, HOSP PERFORMED - Abnormal; Notable for the following components:   Opiates POSITIVE (*)    Cocaine POSITIVE (*)    Tetrahydrocannabinol POSITIVE (*)    All other components within normal limits  CBC WITH  DIFFERENTIAL/PLATELET - Abnormal; Notable for the following components:   WBC 11.3 (*)    RBC 3.99 (*)    Hemoglobin 11.7 (*)    HCT 36.1 (*)    Platelets 477 (*)    Neutro Abs 8.6 (*)    All other components within normal limits    EKG None  Radiology No results found.  Procedures Procedures    Medications Ordered in ED Medications  potassium chloride SA (KLOR-CON M) CR tablet 40 mEq (has no administration in time range)  ibuprofen (ADVIL) tablet 800 mg (has no administration in time range)    ED Course/ Medical Decision Making/ A&P                                 Medical Decision Making 32 year old male with past medical history of homelessness, substance abuse, and schizoaffective disorder presenting to the emergency department today with auditory hallucinations and reported suicidal ideations.  I will further evaluate the patient here with labs and have our psych crisis team speak with the patient.  He is here voluntarily.  I will reevaluate for ultimate disposition.  The patient's potassium is low and he is given oral potassium.  He is medically cleared for psychiatric evaluation.  Amount and/or Complexity of Data Reviewed Labs: ordered.  Risk Prescription drug management.           Final Clinical Impression(s) / ED Diagnoses Final diagnoses:  Auditory hallucination    Rx / DC Orders ED Discharge Orders     None         Durwin Glaze, MD 01/12/24 2151

## 2024-01-12 NOTE — ED Notes (Signed)
 Pt changed into burgundy srcrubs. Black bookbag plus pt belonging bag stored in cabinet labeled Hall [address] with pt stickers on each bag.

## 2024-01-12 NOTE — ED Notes (Signed)
 Drink and [place] sandwich given.

## 2024-01-12 NOTE — Discharge Instructions (Addendum)
 Follow up with primary care doctor.  You can take Tylenol 1000 mg up to 4 times a day. Since you are not able to pick up steroids I have resent the prednisone.  Please take for 5 days.  You may use ice and heat over the area of pain.  I have sent lidocaine patches to your pharmacy which you can take as prescribed.

## 2024-01-12 NOTE — ED Provider Notes (Signed)
 [place] EMERGENCY DEPARTMENT AT [facility] Provider Note   CSN: [id number] Arrival date & time: 01/12/24  1208     History  Chief Complaint  Patient presents with   Leg Pain    Mark Hammond is a 33 y.o. male.  With past medical history of major depressive disorder, alcohol use disorder, substance use, hepatitis C presenting to emergency room with complaint of leg pain.  Patient is coming in from EMS.  He is homeless.  He reports when he woke up this morning the pain in his left leg was worse than normal.  Does have a history of similar thing.  He reports it feels the same.  He is requesting something for pain and something to eat.  He reports the pain is worse when he tries to move. Denies IVDU. Denies fevers or chills. Denies saddle anesthesia, loss of bowel or bladder. No new injury, trauma or fall.    Leg Pain Associated symptoms: back pain        Home Medications Prior to Admission medications   Medication Sig Start Date End Date Taking? Authorizing Provider  acetaminophen (TYLENOL) 325 MG tablet Take 2 tablets (650 mg total) by mouth every 6 (six) hours as needed for mild pain (pain score 1-3). 11/26/23   Carlyn Reichert, MD  gabapentin (NEURONTIN) 300 MG capsule Take 2 capsules (600 mg total) by mouth 3 (three) times daily. 12/12/23 01/11/24  Carrion-Carrero, Karle Starch, MD  hydrOXYzine (ATARAX) 25 MG tablet Take 1 tablet (25 mg total) by mouth every 6 (six) hours as needed for anxiety. 11/26/23   Carlyn Reichert, MD  methylPREDNISolone (MEDROL DOSEPAK) 4 MG TBPK tablet Take as directed on packet 01/06/24   Horton, Mayer Masker, MD  Multiple Vitamin (MULTIVITAMIN WITH MINERALS) TABS tablet Take 1 tablet by mouth daily. 12/13/23   Carrion-Carrero, Karle Starch, MD  nicotine polacrilex (NICORETTE) 2 MG gum Take 1 each (2 mg total) by mouth as needed for smoking cessation. 12/12/23   Carrion-Carrero, Karle Starch, MD  QUEtiapine (SEROQUEL) 100 MG tablet Take 1 tablet (100 mg total) by  mouth at bedtime. 12/12/23   Carrion-Carrero, Karle Starch, MD  thiamine (VITAMIN B-1) 100 MG tablet Take 1 tablet (100 mg total) by mouth daily. 12/13/23   Carrion-Carrero, Karle Starch, MD      Allergies    Codeine    Review of Systems   Review of Systems  Musculoskeletal:  Positive for back pain.    Physical Exam Updated Vital Signs BP 112/67 (BP Location: Right Arm)   Pulse 81   Temp 97.6 F (36.4 C) (Oral)   Resp 18   Ht 5\' 4"  (1.626 m)   Wt 63.5 kg   SpO2 100%   BMI 24.03 kg/m  Physical Exam Vitals and nursing note reviewed.  Constitutional:      General: He is not in acute distress.    Appearance: He is not toxic-appearing.  HENT:     Head: Normocephalic and atraumatic.  Eyes:     General: No scleral icterus.    Conjunctiva/sclera: Conjunctivae normal.  Cardiovascular:     Rate and Rhythm: Normal rate and regular rhythm.     Pulses: Normal pulses.     Heart sounds: Normal heart sounds.  Pulmonary:     Effort: Pulmonary effort is normal. No respiratory distress.     Breath sounds: Normal breath sounds.  Abdominal:     General: Abdomen is flat. Bowel sounds are normal.     Palpations: Abdomen is soft.  Tenderness: There is no abdominal tenderness.  Musculoskeletal:     Right lower leg: No edema.     Left lower leg: No edema.     Comments: Strong dorsal pedal pulse equal bilaterally.  Strong dorsiflexion and plantarflexion of bilateral feet.  Unable to comply with remaining physical exam secondary to pain.  Sensation is intact.  Skin:    General: Skin is warm and dry.     Findings: No lesion.  Neurological:     General: No focal deficit present.     Mental Status: He is alert and oriented to person, place, and time. Mental status is at baseline.     ED Results / Procedures / Treatments   Labs (all labs ordered are listed, but only abnormal results are displayed) Labs Reviewed - No data to display  EKG None  Radiology No results  found.  Procedures Procedures    Medications Ordered in ED Medications - No data to display  ED Course/ Medical Decision Making/ A&P Clinical Course as of 01/12/24 1439  Fri Jan 12, 2024  1422 Patient reassessed and feeling better. Requesting something to eat and drink. He is stable for discharge. He was never able to pick up steriods from prior visits, I will resend to [facility] pharmacy.  [JB]    Clinical Course User Index [JB] Javon Hupfer, Horald Chestnut, PA-C                                 Medical Decision Making Risk OTC drugs. Prescription drug management.   This patient presents to the ED for concern of back pain, this involves an extensive number of treatment options, and is a complaint that carries with it a high risk of complications and morbidity.  The differential diagnosis includes musculoskeletal, abscess, cauda equina, muscle strain, DVT, fracture   Co morbidities that complicate the patient evaluation  Drug use    Additional history obtained:  Additional history obtained from 01/05/24 ED visit for similar... In which patient presented with similar complaint.    Imaging Studies ordered:  Considered, however, had prior recent MR 01/05/24 without significant change in symptoms. No new injury, trauma or fall thus do not feel repeat imaging is needed at this time. Patient denies IVDU and reports he has not done IV drug since prior MR.     Problem List / ED Course / Critical interventions / Medication management  Patient presenting with ongoing left leg pain.  He is hemodynamically stable and well-appearing although appears disheveled.  I do have limited physical exam here secondary to pain.  He does have strong dorsiflexion and plantarflexion.  He denies history of IV drug use but has history of drug [place].  He denies any change in symptoms since prior MRI.  Prior MRI did not show any obvious abscess of the spinal cord. Denies recent injury trauma or fall. No obvious deformity or  swelling of lower extremity. No red flag symptoms associated with back pain. Will give pain control and reassess.  Patient feeling better after medication.  He reports improvement of pain.  He is able to move extremity.  Requesting something to eat and discharge.  Feel he is appropriate for discharge as he is hemodynamically stable and overall well-appearing.   I ordered medication including Toradol, Flexeril.  Reevaluation of the patient after these medicines showed that the patient improved I have reviewed the patients home medicines and have made adjustments as  needed   Plan  F/u w/ PCP in 2-3d to ensure resolution of sx.  Patient was given return precautions. Patient stable for discharge at this time.  Patient educated on sx/dx and verbalized understanding of plan. Return to ER w/ new or worsening sx.          Final Clinical Impression(s) / ED Diagnoses Final diagnoses:  Left leg pain    Rx / DC Orders ED Discharge Orders          Ordered    predniSONE (DELTASONE) 20 MG tablet  Daily        01/12/24 1425    acetaminophen (TYLENOL) 500 MG tablet  Every 6 hours PRN        01/12/24 1425              Barnaby Rippeon, Horald Chestnut, PA-C 01/12/24 1441    Rondel Baton, MD 01/12/24 1743

## 2024-01-12 NOTE — ED Triage Notes (Signed)
 Patient presents to ED [facility] from outside location , states he was sleeping outside and when he woke up c/o having pain in his left leg and hurts to walk, hx. Of same.

## 2024-01-13 DIAGNOSIS — F1520 Other stimulant dependence, uncomplicated: Secondary | ICD-10-CM

## 2024-01-13 LAB — COMPREHENSIVE METABOLIC PANEL WITH GFR
ALT: 43 U/L (ref 0–44)
AST: 41 U/L (ref 15–41)
Albumin: 2.6 g/dL — ABNORMAL LOW (ref 3.5–5.0)
Alkaline Phosphatase: 125 U/L (ref 38–126)
Anion gap: 7 (ref 5–15)
BUN: 8 mg/dL (ref 6–20)
CO2: 28 mmol/L (ref 22–32)
Calcium: 7.9 mg/dL — ABNORMAL LOW (ref 8.9–10.3)
Chloride: 100 mmol/L (ref 98–111)
Creatinine, Ser: 0.34 mg/dL — ABNORMAL LOW (ref 0.61–1.24)
GFR, Estimated: >60 mL/min (ref >60)
Glucose, Bld: 106 mg/dL — ABNORMAL HIGH (ref 70–99)
Potassium: 3.4 mmol/L — ABNORMAL LOW (ref 3.5–5.1)
Sodium: 135 mmol/L (ref 135–145)
Total Bilirubin: 0.3 mg/dL (ref 0.0–1.2)
Total Protein: 7.1 g/dL (ref 6.5–8.1)

## 2024-01-13 MED ORDER — THIAMINE MONONITRATE 100 MG PO TABS
100.0000 mg | ORAL_TABLET | Freq: Every day | ORAL | Status: AC
  Administered 2024-01-13: 100 mg via ORAL

## 2024-01-13 MED ORDER — ONDANSETRON 4 MG PO TBDP: 4.0000 mg | ORAL_TABLET | Freq: Four times a day (QID) | ORAL | Status: DC | PRN

## 2024-01-13 MED ORDER — TRAZODONE HCL 100 MG PO TABS
50.0000 mg | ORAL_TABLET | Freq: Every day | ORAL | Status: DC
  Administered 2024-01-13: 50 mg via ORAL
  Filled 2024-01-13: qty 1

## 2024-01-13 MED ORDER — THIAMINE MONONITRATE 100 MG PO TABS
100.0000 mg | ORAL_TABLET | Freq: Every day | ORAL | Status: DC
  Filled 2024-01-13: qty 1

## 2024-01-13 MED ORDER — HYDROXYZINE HCL 25 MG PO TABS
25.0000 mg | ORAL_TABLET | Freq: Four times a day (QID) | ORAL | Status: DC | PRN
  Administered 2024-01-13: 25 mg via ORAL
  Filled 2024-01-13: qty 1

## 2024-01-13 MED ORDER — LOPERAMIDE HCL 2 MG PO CAPS: 2.0000 mg | ORAL_CAPSULE | ORAL | Status: DC | PRN

## 2024-01-13 MED ORDER — THIAMINE HCL 100 MG/ML IJ SOLN: 100.0000 mg | Freq: Once | INTRAMUSCULAR | Status: DC

## 2024-01-13 MED ORDER — LORAZEPAM 1 MG PO TABS: 1.0000 mg | ORAL_TABLET | Freq: Four times a day (QID) | ORAL | Status: DC | PRN

## 2024-01-13 MED ORDER — NICOTINE POLACRILEX 2 MG MT GUM
2.0000 mg | CHEWING_GUM | OROMUCOSAL | Status: DC
  Administered 2024-01-13 (×2): 2 mg via ORAL
  Filled 2024-01-13 (×2): qty 1

## 2024-01-13 MED ORDER — ADULT MULTIVITAMIN W/MINERALS CH
1.0000 | ORAL_TABLET | Freq: Every day | ORAL | Status: DC
  Administered 2024-01-13: 1 via ORAL
  Filled 2024-01-13: qty 1

## 2024-01-13 NOTE — BH Assessment (Signed)
 Comprehensive Clinical Assessment (CCA) Note  01/13/2024 Mark Hammond [id number]  Disposition: [name], NP recommends pt to be observed and reassessed by psychiatry. Disposition discussed with Sabrina Crandall, RN via secure message.   The patient demonstrates the following risk factors for suicide: Chronic risk factors for suicide include: psychiatric disorder of schizoaffective Disorder (HCC), substance use disorder, and previous suicide attempts Pt reports one previous suicide attempt . Acute risk factors for suicide include: unemployment, social withdrawal/isolation, and Passively suicidal . Protective factors for this patient include:  No risk . Considering these factors, the overall suicide risk at this point appears to be moderate. Patient is not appropriate for outpatient follow up.  Mark Hammond is a 33 year old male who presents voluntary and unaccompanied to [facility] Emergency Department. Clinician asked the pt, "what brought you to the hospital?" Pt reports, "just voices" (substance induced.) Pt reports he came to [facility] earlier but came back because of the voices. Per pt, with the voices it's "prepare, plan and execute." Pt reports, today the voices were telling him "don't worry about nothing, don't worry about the consequences." Pt reports, he's suicidal with no plan, intent or means. Pt denies, HI, self-injurious behaviors and access to weapons.   Pt reports, drinking one tall beer and two short beers yesterday. Per pt, he smoked $5 in Meth two and half days ago. Per pt, taking two hits of Marijuana. Pt's BAL was 194 at 2110. Pt's UDS is positive for Opiates, Cocaine and Marijuana. Pt reports, he's addicted to his pain pills including Gabapentin and his sleeping medications (starts with a "Q.") Pt reports, the hospital prescribes his medications but is linked to OPT resources (medication management and/or counseling.)  Pt reports, he came in to because he wants to go to  detox at [facility]. Pt reports, he passed the phone interview for [facility] but doesn't recall why he did not go. Pt reports, he can't go to [facility], then said he probably can go to [facility].   Pt presents laying on his side in a hospital bed shirtless with his jeans low showing his lower abdomen. Pt had a book bag in his bed with no shoes on. Pt's mood was depressed, calm. Pt's affect was congruent. Pt's insight was fair. Pt' judgement was poor. Clinician discussed the three possible dispositions (discharged with OPT resources, observe/reassess by psychiatry or inpatient treatment) in detail. Pt is homeless for 7 months due to eviction. Pt reports, he sleeps at [employer] at times and is trying to move in with his uncle but his uncle girlfriend hasn't agreed with the move.   Chief Complaint:  Chief Complaint  Patient presents with   Hallucinations   Visit Diagnosis: Schizoaffective Disorder (HCC)                             Amphetamine-type substance use Disorder, severe.                             Alcohol use Disorder, severe.  CCA Screening, Triage and Referral (STR)  Patient Reported Information How did you hear about us ? Legal System  What Is the Reason for Your Visit/Call Today? Pt reports, hearing voices (substance induced) to hurt himself. Pt reports, passive suicidal ideations with no plan, intent or means. Pt denies, HI, self-injurious behaviors and access to weapons.  How Long Has This Been Causing You Problems? <  Week  What Do You Feel Would Help You the Most Today? Alcohol or Drug Use Treatment; Housing Assistance   Have You Recently Had Any Thoughts About Hurting Yourself? Yes  Are You Planning to Commit Suicide/Harm Yourself At This time? No   Flowsheet Row ED from 01/12/2024 in [facility] Emergency Department at [facility] Most recent reading at 01/12/2024 10:00 PM ED from 01/12/2024 in [facility] Emergency Department at [facility] Most recent reading at  01/12/2024 12:41 PM ED from 01/05/2024 in [facility] Emergency Department at [facility] Most recent reading at 01/05/2024 10:17 PM  C-SSRS RISK CATEGORY No Risk No Risk No Risk       Have you Recently Had Thoughts About Hurting Someone Mark Hammond? No  Are You Planning to Harm Someone at This Time? No  Explanation: None.   Have You Used Any Alcohol or Drugs in the Past 24 Hours? Yes  How Long Ago Did You Use Drugs or Alcohol? Yesterday (01/11/2024). What Did You Use and How Much? Pt reports, drinking one tall beer and two short beers yesterday. Per pt, he smoked $5 in Meth two and half days ago. Per pt, taking two hits of Marijuana. Pt reports, he's addicted to his pain pills including Gabapentin and his sleeping medications (starts with a "Q.")   Do You Currently Have a Therapist/Psychiatrist? No  Name of Therapist/Psychiatrist:  NA  Have You Been Recently Discharged From Any Office Practice or Programs? No  Explanation of Discharge From Practice/Program: NA     CCA Screening Triage Referral Assessment Type of Contact: Face-to-Face  Telemedicine Service Delivery:   Is this Initial or Reassessment?   Date Telepsych consult ordered in [facility]:    Time Telepsych consult ordered in [facility]:    Location of Assessment: [facility] ED  Provider Location: [facility] Assessment Services   Collateral Involvement: None.   Does Patient Have a Automotive engineer Guardian? No  Legal Guardian Contact Information: Pt is his own guardian.  Copy of Legal Guardianship Form: -- (Pt is his own guardian.)  Legal Guardian Notified of Arrival: -- (Pt is his own guardian.)  Legal Guardian Notified of Pending Discharge: -- (Pt is his own guardian.)  If Minor and Not Living with Parent(s), Who has Custody? Pt is an adult.  Is CPS involved or ever been involved? Never  Is APS involved or ever been involved? Never   Patient Determined To Be At Risk for Harm To Self or Others Based on Review of Patient  Reported Information or Presenting Complaint? Yes, for Self-Harm  Method: No Plan  Availability of Means: No access or NA  Intent: Vague intent or NA  Notification Required: No need or identified person  Additional Information for Danger to Others Potential: -- (NA)  Additional Comments for Danger to Others Potential: None.  Are There Guns or Other Weapons in Your Home? No  Types of Guns/Weapons: Pt denies, access to weapons.  Are These Weapons Safely Secured?                            -- (NA)  Who Could Verify You Are Able To Have These Secured: NA  Do You Have any Outstanding Charges, Pending Court Dates, Parole/Probation? Pt denies, legal involvement.  Contacted To Inform of Risk of Harm To Self or Others: Other: Comment (NA)    Does Patient Present under Involuntary Commitment? No    [place] of Residence:  [place]   Patient Currently Receiving the Following Services: Not Receiving Services   Determination of Need: Urgent (48 hours)   Options For Referral: Facility-Based Crisis; Inpatient Hospitalization; BH Urgent Care     CCA Biopsychosocial Patient Reported Schizophrenia/Schizoaffective Diagnosis in Past: Yes   Strengths: Pt is seeking help.   Mental Health Symptoms Depression:  Hopelessness; Fatigue; Tearfulness; Sleep (too much or little) (Isolation.)   Duration of Depressive symptoms: Duration of Depressive Symptoms: Greater than two weeks   Mania:  None   Anxiety:   Fatigue; Worrying; Restlessness   Psychosis:  Hallucinations   Duration of Psychotic symptoms: Duration of Psychotic Symptoms: Greater than six months   Trauma:  N/A   Obsessions:  N/A   Compulsions:  None   Inattention:  Forgetful   Hyperactivity/Impulsivity:  Feeling of restlessness   Oppositional/Defiant Behaviors:  None   Emotional Irregularity:  Recurrent suicidal behaviors/gestures/threats   Other Mood/Personality Symptoms:  NA    Mental Status  Exam Appearance and self-care  Stature:  Small   Weight:  Average weight   Clothing:  Disheveled (Pt is shirtless with his pants low showing his lower abdomen.)   Grooming:  Neglected   Cosmetic use:  None   Posture/gait:  Normal   Motor activity:  Restless   Sensorium  Attention:  Normal   Concentration:  Normal   Orientation:  X5   Recall/memory:  Normal   Affect and Mood  Affect:  Congruent   Mood:  Other (Comment); Depressed (Calm.)   Relating  Eye contact:  Normal   Facial expression:  Responsive   Attitude toward examiner:  Cooperative   Thought and Language  Speech flow: Normal   Thought content:  Appropriate to Mood and Circumstances   Preoccupation:  None   Hallucinations:  Auditory; Command (Comment) (Voices telling him to kill himself.)   Organization:  Patent examiner of Knowledge:  Average   Intelligence:  Average   Abstraction:  Normal   Judgement:  Poor   Reality Testing:  Adequate   Insight:  Fair   Decision Making:  Impulsive   Social Functioning  Social Maturity:  Isolates   Social Judgement:  "Street Smart"   Stress  Stressors:  Housing; Other (Comment) (Pt reports, "my future, medications and recovery.")   Coping Ability:  Deficient supports; Overwhelmed; Exhausted   Skill Deficits:  Self-control; Self-care; Decision making; Responsibility; Interpersonal   Supports:  Support needed; Family     Religion: Religion/Spirituality Are You A Religious Person?: No How Might This Affect Treatment?: NA  Leisure/Recreation: Leisure / Recreation Do You Have Hobbies?: Yes Leisure and Hobbies: Video games, music and YouTube.  Exercise/Diet: Exercise/Diet Do You Exercise?: No Have You Gained or Lost A Significant Amount of Weight in the Past Six Months?: No Do You Follow a Special Diet?: No Do You Have Any Trouble Sleeping?: Yes Explanation of Sleeping Difficulties: Pt takes sleep  medications.   CCA Employment/Education Employment/Work Situation: Employment / Work Situation Employment Situation: Unemployed Patient's Job has Been Impacted by Current Illness: No Has Patient ever Been in Equities trader?: No  Education: Education Is Patient Currently Attending School?: No Last Grade Completed: 10 Did You Product manager?: No Did You Have An Individualized Education Program (IIEP): No Did You Have Any Difficulty At School?: No Patient's Education Has Been Impacted by Current Illness: No   CCA Family/Childhood History Family and Relationship History: Family history Marital status: Single Does patient have children?: No  Childhood  History:  Childhood History By whom was/is the patient raised?: Grandparents, Other (Comment) (Aunt.) Did patient suffer any verbal/emotional/physical/sexual abuse as a child?: No Did patient suffer from severe childhood neglect?: No Has patient ever been sexually abused/assaulted/raped as an adolescent or adult?: No Was the patient ever a victim of a crime or a disaster?: No Witnessed domestic violence?: No Has patient been affected by domestic violence as an adult?: No   CCA Substance Use Alcohol/Drug Use: Alcohol / Drug Use Pain Medications: See MAR Prescriptions: See MAR Over the Counter: See MAR History of alcohol / drug use?: Yes Longest period of sobriety (when/how long): UTA Negative Consequences of Use: Financial Withdrawal Symptoms: None Substance #1 Name of Substance 1: Alcohol. 1 - Age of First Use: 14. 1 - Amount (size/oz): Pt reports, drinking one tall beer and two short beers yesterday. 1 - Frequency: Everyday. 1 - Duration: Ongoing. 1 - Last Use / Amount: 01/11/2024. 1 - Method of Aquiring: Panhandling. 1- Route of Use: Oral. Substance #2 Name of Substance 2: Methamphetamines. 2 - Age of First Use: 14. 2 - Amount (size/oz): Pt reports, smoking $5 of Methamphetamines two and half days ago. 2 -  Frequency: Per pt, 1-3 times per month. 2 - Duration: Ongoing. 2 - Last Use / Amount: Two and half days ago. 2 - Method of Aquiring: Panhandling. 2 - Route of Substance Use: Smoke.    ASAM's:  Six Dimensions of Multidimensional Assessment  Dimension 1:  Acute Intoxication and/or Withdrawal Potential:   Dimension 1:  Description of individual's past and current experiences of substance use and withdrawal: None.  Dimension 2:  Biomedical Conditions and Complications:   Dimension 2:  Description of patient's biomedical conditions and  complications: NA  Dimension 3:  Emotional, Behavioral, or Cognitive Conditions and Complications:  Dimension 3:  Description of emotional, behavioral, or cognitive conditions and complications: Pt is passively suicidal. Pt hears voices (substance induced) to hurt himself.  Dimension 4:  Readiness to Change:  Dimension 4:  Description of Readiness to Change criteria: Pt reports, wanting to detox at [facility].  Dimension 5:  Relapse, Continued use, or Continued Problem Potential:  Dimension 5:  Relapse, continued use, or continued problem potential critiera description: Pt has ongoing usage since he was 14. Pt reports, he was approved to go to [facility] but doesn't recall why he did not go.  Dimension 6:  Recovery/Living Environment:  Dimension 6:  Recovery/Iiving environment criteria description: Pt is homeless for 7 months due to eviction. Pt reports, he sleeps at [employer] at times and is trying to move in with his uncle but his uncle girlfriend hasn't agreed with the move.  ASAM Severity Score: ASAM's Severity Rating Score: 8  ASAM Recommended Level of Treatment: ASAM Recommended Level of Treatment: Level III Residential Treatment   Substance use Disorder (SUD) Substance Use Disorder (SUD)  Checklist Symptoms of Substance Use: Social, occupational, recreational activities given up or reduced due to use, Substance(s) often taken in larger amounts or over longer times than  was intended, Continued use despite having a persistent/recurrent physical/psychological problem caused/exacerbated by use, Continued use despite persistent or recurrent social, interpersonal problems, caused or exacerbated by use, Evidence of tolerance  Recommendations for Services/Supports/Treatments: Recommendations for Services/Supports/Treatments Recommendations For Services/Supports/Treatments: Medication Management, Individual Therapy, Facility Based Crisis, IOP (Intensive Outpatient Program), Partial Hospitalization  Disposition Recommendation per psychiatric provider: Pt to be observed and reassessed by psychiatry   DSM5 Diagnoses: Patient Active Problem List   Diagnosis Date Noted  Polysubstance dependence (HCC) 12/13/2023   Polysubstance abuse (HCC) 11/21/2023   Alcohol use disorder 08/04/2023   Stimulant use disorder 08/04/2023   Opioid use disorder 08/04/2023   Tobacco use disorder 08/04/2023   Cannabis use disorder 08/04/2023   Hepatitis C 03/01/2023   Anxiety state 01/14/2023   Insomnia 01/14/2023   Schizoaffective disorder (HCC) 01/13/2023   Schizophrenia (HCC) 11/15/2019   Polysubstance (including opioids) dependence, daily use (HCC)    Substance induced mood disorder (HCC) 10/24/2019   Methamphetamine use disorder, 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     Referrals to Alternative Service(s): Referred to Alternative Service(s):   Place:   Date:   Time:    Referred to Alternative Service(s):   Place:   Date:   Time:    Referred to Alternative Service(s):   Place:   Date:   Time:    Referred to Alternative Service(s):   Place:   Date:   Time:     Rosi Converse, [facility] Comprehensive Clinical Assessment (CCA) Screening, Triage and Referral Note  01/13/2024 Mark Hammond [id number]  Chief Complaint:   Chief Complaint  Patient presents with   Hallucinations   Visit Diagnosis:   Patient Reported Information How did you hear about us ? Legal System  What Is the Reason for Your Visit/Call Today? Pt reports, hearing voices (substance induced) to hurt himself. Pt reports, passive suicidal ideations with no plan, intent or means. Pt denies, HI, self-injurious behaviors and access to weapons.  How Long Has This Been Causing You Problems? <Week  What Do You Feel Would Help You the Most Today? Alcohol or Drug Use Treatment; Housing Assistance   Have You Recently Had Any Thoughts About Hurting Yourself? Yes  Are You Planning to Commit Suicide/Harm Yourself At This time? No   Have you Recently Had Thoughts About Hurting Someone Mark Hammond? No  Are You Planning to Harm Someone at This Time? No  Explanation: None.   Have You Used Any Alcohol or Drugs in the Past 24 Hours? Yes  How Long Ago Did You Use Drugs or Alcohol? Yesterday (01/11/2024). What Did You Use and How Much? Pt reports, drinking one tall beer and two short beers yesterday. Per pt, he smoked $5 in Meth two and half days ago. Per pt, taking two hits of Marijuana. Pt reports, he's addicted to his pain pills including Gabapentin and his sleeping medications (starts with a "Q.")   Do You Currently Have a Therapist/Psychiatrist? No  Name of Therapist/Psychiatrist: NA   Have You Been Recently Discharged From Any Office Practice or Programs? No  Explanation of Discharge From Practice/Program:    CCA Screening Triage Referral Assessment Type of Contact: Face-to-Face  Telemedicine Service Delivery:   Is this Initial or Reassessment?   Date Telepsych consult ordered in [facility]:    Time Telepsych consult ordered in [facility]:    Location of Assessment: [facility]  Provider Location: [facility] Assessment Services    Collateral Involvement: None.   Does Patient Have a Automotive engineer Guardian? No. Name and Contact of Legal Guardian:  Pt is his own guardian.  If Minor and Not Living with Parent(s), Who has Custody? Pt is an adult.  Is CPS involved or ever been involved? Never  Is APS involved or ever been involved? Never   Patient Determined To Be At Risk for Harm To  Self or Others Based on Review of Patient Reported Information or Presenting Complaint? Yes, for Self-Harm  Method: No Plan  Availability of Means: No access or NA  Intent: Vague intent or NA  Notification Required: No need or identified person  Additional Information for Danger to Others Potential: -- (NA)  Additional Comments for Danger to Others Potential: None.  Are There Guns or Other Weapons in Your Home? No  Types of Guns/Weapons: Pt denies, access to weapons.  Are These Weapons Safely Secured?                            -- (NA)  Who Could Verify You Are Able To Have These Secured: NA  Do You Have any Outstanding Charges, Pending Court Dates, Parole/Probation? Pt denies, legal involvement.  Contacted To Inform of Risk of Harm To Self or Others: Other: Comment (NA)   Does Patient Present under Involuntary Commitment? No    [place] of Residence: [place]   Patient Currently Receiving the Following Services: Not Receiving Services   Determination of Need: Urgent (48 hours)   Options For Referral: Facility-Based Crisis; Inpatient Hospitalization; [facility]   Disposition Recommendation per psychiatric provider: Pt to be observed and reassessed by psychiatry.  Rosi Converse, LCMHC   Mark Mataya D Kashmere Daywalt, MS, [facility], [facility] Triage Specialist [phone]

## 2024-01-13 NOTE — Consult Note (Signed)
 [facility] Psychiatric Consult Initial  Patient Name: .Mark Hammond  MRN: [id number]  DOB: 1991/02/28  Consult Order details:  Orders (From admission, onward)     Start     Ordered   01/12/24 2319  CONSULT TO CALL ACT TEAM       Ordering Provider: Carin Charleston, MD  Provider:  (Not yet assigned)  Question:  Reason for Consult?  Answer:  Psych consult   01/12/24 2319   01/12/24 2021  CONSULT TO CALL ACT TEAM       Ordering Provider: Carin Charleston, MD  Provider:  (Not yet assigned)  Question:  Reason for Consult?  Answer:  SI, auditory hallucinations   01/12/24 2020             Mode of Visit: In person    Psychiatry Consult Evaluation  Service Date: January 13, 2024 LOS:  LOS: 0 days  Chief Complaint  " I want to go to detox"   Primary Psychiatric Diagnoses  Amphetamine use disorder Alcohol use disorder Schizoaffective disorder  Assessment  Anuj Summons is a 33 y.o. male admitted: Presented to the ED on 01/12/2024  7:57 PM for polysubstance abuse, with voices complaining of suicidal ideations. He carries the psychiatric diagnoses of polysubstance abuse, and schizoaffective disorder and has a past medical history of chronic left leg pain.    Per Chart and CCA Review: "Teran Knittle is a 33 year old male who presents voluntary and unaccompanied to [facility] Emergency Department. Due to "voices" (substance induced.) Pt reports he came to [facility] earlier but came back because of the voices. Per pt, with the voices it's "prepare, plan and execute." Pt reports, today the voices were telling him "don't worry about nothing, don't worry about the consequences." Pt reports, he's suicidal with no plan, intent or means. Pt denies, HI, self-injurious behaviors and access to weapons.   Pt reports, drinking one tall beer and two short beers yesterday. Per pt, he smoked $5 in Meth two and half days ago. Per pt, taking two hits of Marijuana. Pt's BAL was 194 on arrival. Pt's UDS is positive  for Opiates, Cocaine and Marijuana. Pt reports, he's addicted to his pain pills including Gabapentin and his sleeping medications (starts with a "Q.") Pt reports, the hospital prescribes his medications but is linked to OPT resources (medication management and/or counseling.)  Pt reports, he came in to because he wants to go to detox at [facility]. Pt reports, he passed the phone interview for [facility] but doesn't recall why he did not go. Pt reports, he can't go to [facility], then said he probably can go to [facility].    Pt presents laying on his side, facing provider, with the blankets covered to his chin, he appears to be in no acute distress.  Pt's mood was depressed, calm. Pt's affect was congruent. Pt's insight was fair. Pt' judgement was poor. Pt reports, he sleeps at [employer] at times and is trying to move in with his uncle but his uncle girlfriend hasn't agreed with the move."  Please see plan below for detailed recommendations.   Diagnoses:  Active Hospital problems: Principal Problem:   Amphetamine use disorder, severe (HCC) Active Problems:   Schizoaffective disorder (HCC)   Alcohol use disorder    Plan   ## Psychiatric Medication Recommendations:  CIWA protocol Ativan detox as needed Nicorette gum for nicotine dependence  ## Medical Decision Making Capacity: Not specifically addressed in this encounter  ## Further Work-up:  --  EKG and a repeat CMP  EKG or UDS -- most recent EKG on 12/18/2023 had QtC of 439 -- Pertinent labwork reviewed earlier this admission includes: CBC, CMP, EKG, UDS   ## Disposition:--We recommend facility based crisis treatment (FBC) at [facility] for detox.  Patient is voluntary.  Patient has been accepted to [facility] at 1450.  ## Behavioral / Environmental: -To minimize splitting of staff, assign one staff person to communicate all information from the team when feasible. or Utilize compassion and acknowledge the patient's experiences while setting clear and realistic  expectations for care.    ## Safety and Observation Level:  - Based on my clinical evaluation, I estimate the patient to be at low risk of self harm in the current setting. - At this time, we recommend  routine. This decision is based on my review of the chart including patient's history and current presentation, interview of the patient, mental status examination, and consideration of suicide risk including evaluating suicidal ideation, plan, intent, suicidal or self-harm behaviors, risk factors, and protective factors. This judgment is based on our ability to directly address suicide risk, implement suicide prevention strategies, and develop a safety plan while the patient is in the clinical setting. Please contact our team if there is a concern that risk level has changed.  CSSR Risk Category:C-SSRS RISK CATEGORY: Moderate Risk  Suicide Risk Assessment: Patient has following modifiable risk factors for suicide: recklessness, which we are addressing by recommending FBC detox treatment. Patient has following non-modifiable or demographic risk factors for suicide: male gender, history of self harm behavior, and psychiatric hospitalization Patient has the following protective factors against suicide: Supportive family  Thank you for this consult request. Recommendations have been communicated to the primary team.  We will recommend the patient go to facility based crisis for detox at this time.   Chandra Come, PMHNP       History of Present Illness  Relevant Aspects of Hospital ED Course:  Admitted on 01/12/2024 for polysubstance abuse, suicidal ideations and hallucinations.  Patient Report:  Per Chart and CCA Review: "Kc Summerson is a 33 year old male who presents voluntary and unaccompanied to [facility] Emergency Department. Due to "voices" (substance induced.) Pt reports he came to [facility] earlier but came back because of the voices. Per pt, with the voices it's "prepare, plan  and execute." Pt reports, today the voices were telling him "don't worry about nothing, don't worry about the consequences." Pt reports, he's suicidal with no plan, intent or means. Pt denies, HI, self-injurious behaviors and access to weapons.   Pt reports, drinking one tall beer and two short beers yesterday. Per pt, he smoked $5 in Meth two and half days ago. Per pt, taking two hits of Marijuana. Pt's BAL was 194 on arrival. Pt's UDS is positive for Opiates, Cocaine and Marijuana. Pt reports, he's addicted to his pain pills including Gabapentin and his sleeping medications (starts with a "Q.") Pt reports, the hospital prescribes his medications but is linked to OPT resources (medication management and/or counseling.)  Pt reports, he came in to because he wants to go to detox at [facility]. Pt reports, he passed the phone interview for [facility] but doesn't recall why he did not go. Pt reports, he can't go to [facility], then said he probably can go to [facility].    Pt presents laying on his side, facing provider, with the blankets covered to his chin, he appears to be in no acute distress.  Pt's mood  was depressed, calm. Pt's affect was congruent. Pt's insight was fair. Pt' judgement was poor. Pt reports, he sleeps at [employer] at times and is trying to move in with his uncle but his uncle girlfriend hasn't agreed with the move."  Please see plan below for detailed recommendations.   Psych ROS:  Depression: Endorses Anxiety: Endorses Mania (lifetime and current): Denies Psychosis: (lifetime and current): Yes  Collateral information:  Contacted none, attempted to contact Merian Stamps, patient's "uncle " caregiver, no answer left a HIPAA compliant voicemail.  Review of Systems  Psychiatric/Behavioral:  Positive for depression, hallucinations and substance abuse.      Psychiatric and Social History  Psychiatric History:  Information collected from patient and chart review  Prev Dx/Sx: Schizoaffective  disorder, polysubstance abuse, anxiety Current Psych Provider: None Home Meds (current): Yes Previous Med Trials: Yes Therapy: None  Prior Psych Hospitalization: Yes Prior Self Harm: Yes Prior Violence: Denies  Family Psych History: Denies Family Hx suicide: Denies  Social History:  Developmental Hx: Deferred Educational Hx: Patient did not graduate high school Occupational Hx: Unemployed Legal Hx: Yes Living Situation: Homeless Spiritual Hx: Yes Access to weapons/lethal means: Denies  Substance History Alcohol: Yes Type of alcohol any Last Drink unknown Number of drinks per day unknown History of alcohol withdrawal seizures denies History of DT's denies Tobacco: Yes Illicit drugs: Yes Prescription drug abuse: Past history Rehab hx: Yes  Exam Findings  Physical Exam:  Vital Signs:  Temp:  [98.3 F (36.8 C)] 98.3 F (36.8 C) (04/12 0925) Pulse Rate:  [81-110] 81 (04/12 0925) Resp:  [16-20] 16 (04/12 0925) BP: (107-117)/(57-69) 107/67 (04/12 0925) SpO2:  [97 %-100 %] 100 % (04/12 0925) Blood pressure 107/67, pulse 81, temperature 98.3 F (36.8 C), temperature source Oral, resp. rate 16, SpO2 100%. There is no height or weight on file to calculate BMI.  Physical Exam Vitals and nursing note reviewed. Exam conducted with a chaperone present.  Neurological:     Mental Status: He is alert.  Psychiatric:        Attention and Perception: Attention normal.        Mood and Affect: Mood is anxious. Affect is flat.        Speech: Speech normal.        Behavior: Behavior is cooperative.        Thought Content: Thought content includes suicidal ideation.        Cognition and Memory: Memory normal.        Judgment: Judgment is impulsive.     Mental Status Exam: General Appearance: Disheveled  Orientation:  Full (Time, Place, and Person)  Memory:  Immediate;   Fair Remote;   Fair  Concentration:  Concentration: Fair and Attention Span: Fair  Recall:  Fair   Attention  Fair  Eye Contact:  Fair  Speech:  Clear and Coherent  Language:  Fair  Volume:  Normal  Mood: sad  Affect:  Flat  Thought Process:  Coherent  Thought Content:  WDL  Suicidal Thoughts:  Yes.  without intent/plan  Homicidal Thoughts:  No  Judgement:  Impaired  Insight:  Lacking  Psychomotor Activity:  Normal  Akathisia:  N/A  Fund of Knowledge:  Fair      Assets:  Manufacturing systems engineer Desire for Improvement Social Support  Cognition:  WNL  ADL's:  Intact  AIMS (if indicated):        Other History   These have been pulled in through the EMR, reviewed, and updated  if appropriate.  Family History:  The patient's family history includes Stroke in an other family member.  Medical History: Past Medical History:  Diagnosis Date   Alcoholic gastritis    Bipolar 1 disorder (HCC)    Dental abscess    Depression    Drug abuse (HCC)    ETOH abuse    Hallucination    Schizo affective schizophrenia [facility])     Surgical History: History reviewed. No pertinent surgical history.   Medications:   Current Facility-Administered Medications:    ibuprofen (ADVIL) tablet 800 mg, 800 mg, Oral, Q6H PRN, Carin Charleston, MD, 800 mg at 01/12/24 2208  Current Outpatient Medications:    acetaminophen (TYLENOL) 500 MG tablet, Take 1 tablet (500 mg total) by mouth every 6 (six) hours as needed. (Patient taking differently: Take 500 mg by mouth every 6 (six) hours as needed for mild pain (pain score 1-3) or headache.), Disp: 30 tablet, Rfl: 0   gabapentin (NEURONTIN) 300 MG capsule, Take 2 capsules (600 mg total) by mouth 3 (three) times daily. (Patient taking differently: Take 300-600 mg by mouth 2 (two) times daily.), Disp: 180 capsule, Rfl: 0   hydrOXYzine (ATARAX) 25 MG tablet, Take 1 tablet (25 mg total) by mouth every 6 (six) hours as needed for anxiety., Disp: 30 tablet, Rfl: 0   methylPREDNISolone (MEDROL DOSEPAK) 4 MG TBPK tablet, Take as directed on packet (Patient taking  differently: Take 4-24 mg by mouth See admin instructions. Take 24 mg by mouth with food on day one, then decrease by 4 mg once daily until finished), Disp: 21 tablet, Rfl: 0   Nicotine (NICODERM CQ TD), Place 1 patch onto the skin daily as needed (for smoking cessation while hospitalized)., Disp: , Rfl:    QUEtiapine (SEROQUEL) 100 MG tablet, Take 1 tablet (100 mg total) by mouth at bedtime. (Patient taking differently: Take 100 mg by mouth at bedtime as needed (for sleep).), Disp: 30 tablet, Rfl: 0   traZODone (DESYREL) 50 MG tablet, Take 50 mg by mouth at bedtime as needed for sleep., Disp: , Rfl:    Multiple Vitamin (MULTIVITAMIN WITH MINERALS) TABS tablet, Take 1 tablet by mouth daily. (Patient not taking: Reported on 01/13/2024), Disp: , Rfl:    nicotine polacrilex (NICORETTE) 2 MG gum, Take 1 each (2 mg total) by mouth as needed for smoking cessation. (Patient not taking: Reported on 01/13/2024), Disp: 100 tablet, Rfl: 0   predniSONE (DELTASONE) 20 MG tablet, Take 2 tablets (40 mg total) by mouth daily. (Patient not taking: Reported on 01/13/2024), Disp: 10 tablet, Rfl: 0   thiamine (VITAMIN B-1) 100 MG tablet, Take 1 tablet (100 mg total) by mouth daily. (Patient not taking: Reported on 01/13/2024), Disp: , Rfl:   Allergies: Allergies  Allergen Reactions   Codeine Other (See Comments)    Patient states parents told him he was allergic at a young age.    Shataya Winkles MOTLEY-MANGRUM, PMHNP

## 2024-01-13 NOTE — ED Provider Notes (Signed)
 Emergency Medicine Observation Re-evaluation Note  Quintell Bonnin is a 33 y.o. male, seen on rounds today.  Pt initially presented to the ED for complaints of Hallucinations Currently, the patient is asleep in his exam bed without agitation.  Physical Exam  BP (!) 117/57 (BP Location: Left Arm)   Pulse (!) 110   Resp 20   SpO2 97%  Physical Exam General: Resting without distress Cardiac: Not tachycardic on last vitals Lungs: Symmetric rise and fall of chest wall sleeping Psych: No agitation at this time  ED Course / MDM  EKG:   I have reviewed the labs performed to date as well as medications administered while in observation.  Recent changes in the last 24 hours include none reported by overnight nursing.  Plan  Current plan is for awaiting TTS evaluation and recommendations.    Danett Palazzo, Marine Sia, MD 01/13/24 [phone]

## 2024-01-13 NOTE — Progress Notes (Addendum)
 LCSW Progress Note:  MRN: [id number]  Mark Hammond 01/13/2024 10:56 AM  [name], NP recommends pt to be observed and reassessed by psychiatry. Patient to re-assessed, awaiting disposition updates from the [facility] provider.   Updates: Per [facility] provider, Sina Dudley, NP, patient meets criteria for admission to the [facility]. Per [facility] AC, Bevin Bucks, RN, we can accept to [name] Facility Based Crisis pending the above. Attending MD Augusta Blizzard. pt must sign a voluntary consent and fax that to [phone] and send original consent with pt at transfer if not using ECONSENT. RN TO RN report must be called to [phone]. DX Amphetamine Use D.O. Alcohol use d.o. Schizoaffective D.O.

## 2024-01-13 NOTE — ED Notes (Signed)
 Report received from [facility] RN.

## 2024-01-14 ENCOUNTER — Other Ambulatory Visit (HOSPITAL_COMMUNITY)
Admission: EM | Admit: 2024-01-14 | Discharge: 2024-01-17 | Disposition: A | Discharge status: Home / Self Care | Payer: MEDICAID | Attending: Psychiatry | Admitting: Psychiatry

## 2024-01-14 DIAGNOSIS — F1010 Alcohol abuse, uncomplicated: Secondary | ICD-10-CM | POA: Diagnosis not present

## 2024-01-14 DIAGNOSIS — E876 Hypokalemia: Secondary | ICD-10-CM | POA: Insufficient documentation

## 2024-01-14 DIAGNOSIS — F1110 Opioid abuse, uncomplicated: Secondary | ICD-10-CM | POA: Insufficient documentation

## 2024-01-14 DIAGNOSIS — F1520 Other stimulant dependence, uncomplicated: Secondary | ICD-10-CM | POA: Diagnosis present

## 2024-01-14 LAB — SARS CORONAVIRUS 2 BY RT PCR: SARS Coronavirus 2 by RT PCR: NEGATIVE

## 2024-01-14 MED ORDER — DIPHENHYDRAMINE HCL 50 MG/ML IJ SOLN: 50.0000 mg | Freq: Three times a day (TID) | INTRAMUSCULAR | Status: DC | PRN

## 2024-01-14 MED ORDER — LORAZEPAM 2 MG/ML IJ SOLN: 2.0000 mg | Freq: Three times a day (TID) | INTRAMUSCULAR | Status: DC | PRN

## 2024-01-14 MED ORDER — GABAPENTIN 300 MG PO CAPS
300.0000 mg | ORAL_CAPSULE | Freq: Two times a day (BID) | ORAL | Status: DC
  Administered 2024-01-14 – 2024-01-17 (×7): 300 mg via ORAL
  Filled 2024-01-14 (×7): qty 1

## 2024-01-14 MED ORDER — TRAZODONE HCL 50 MG PO TABS
50.0000 mg | ORAL_TABLET | Freq: Every evening | ORAL | Status: DC | PRN
  Administered 2024-01-14 – 2024-01-16 (×4): 50 mg via ORAL
  Filled 2024-01-14 (×4): qty 1

## 2024-01-14 MED ORDER — DIPHENHYDRAMINE HCL 50 MG PO CAPS: 50.0000 mg | ORAL_CAPSULE | Freq: Three times a day (TID) | ORAL | Status: DC | PRN

## 2024-01-14 MED ORDER — HALOPERIDOL 5 MG PO TABS: 5.0000 mg | ORAL_TABLET | Freq: Three times a day (TID) | ORAL | Status: DC | PRN

## 2024-01-14 MED ORDER — ONDANSETRON 4 MG PO TBDP: 4.0000 mg | ORAL_TABLET | Freq: Four times a day (QID) | ORAL | Status: AC | PRN

## 2024-01-14 MED ORDER — NAPROXEN 500 MG PO TABS
500.0000 mg | ORAL_TABLET | Freq: Two times a day (BID) | ORAL | Status: DC | PRN
  Administered 2024-01-14 – 2024-01-16 (×3): 500 mg via ORAL
  Filled 2024-01-14 (×3): qty 1

## 2024-01-14 MED ORDER — THIAMINE MONONITRATE 100 MG PO TABS
100.0000 mg | ORAL_TABLET | Freq: Every day | ORAL | Status: DC
  Administered 2024-01-14 – 2024-01-17 (×4): 100 mg via ORAL
  Filled 2024-01-14 (×3): qty 1

## 2024-01-14 MED ORDER — FERROUS SULFATE 325 (65 FE) MG PO TABS
325.0000 mg | ORAL_TABLET | Freq: Every day | ORAL | Status: DC
  Administered 2024-01-15 – 2024-01-17 (×3): 325 mg via ORAL
  Filled 2024-01-14 (×4): qty 1

## 2024-01-14 MED ORDER — HALOPERIDOL LACTATE 5 MG/ML IJ SOLN: 10.0000 mg | Freq: Three times a day (TID) | INTRAMUSCULAR | Status: DC | PRN

## 2024-01-14 MED ORDER — DIPHENHYDRAMINE-ZINC ACETATE 2-0.1 % EX CREA
TOPICAL_CREAM | Freq: Two times a day (BID) | CUTANEOUS | Status: DC | PRN
  Filled 2024-01-14: qty 28

## 2024-01-14 MED ORDER — TRAZODONE HCL 50 MG PO TABS: 50.0000 mg | ORAL_TABLET | Freq: Every evening | ORAL | Status: DC | PRN

## 2024-01-14 MED ORDER — NICOTINE 14 MG/24HR TD PT24
14.0000 mg | MEDICATED_PATCH | Freq: Every day | TRANSDERMAL | Status: DC
  Administered 2024-01-14 – 2024-01-17 (×3): 14 mg via TRANSDERMAL
  Filled 2024-01-14 (×4): qty 1

## 2024-01-14 MED ORDER — HYDROXYZINE HCL 25 MG PO TABS: 25.0000 mg | ORAL_TABLET | Freq: Three times a day (TID) | ORAL | Status: DC | PRN

## 2024-01-14 MED ORDER — LORAZEPAM 1 MG PO TABS: 1.0000 mg | ORAL_TABLET | Freq: Four times a day (QID) | ORAL | Status: AC | PRN

## 2024-01-14 MED ORDER — LOPERAMIDE HCL 2 MG PO CAPS: 2.0000 mg | ORAL_CAPSULE | ORAL | Status: AC | PRN

## 2024-01-14 MED ORDER — ACETAMINOPHEN 325 MG PO TABS
650.0000 mg | ORAL_TABLET | Freq: Four times a day (QID) | ORAL | Status: DC | PRN
  Administered 2024-01-14 – 2024-01-16 (×3): 650 mg via ORAL
  Filled 2024-01-14 (×2): qty 2

## 2024-01-14 MED ORDER — DICYCLOMINE HCL 20 MG PO TABS
20.0000 mg | ORAL_TABLET | Freq: Four times a day (QID) | ORAL | Status: DC | PRN
  Administered 2024-01-16: 20 mg via ORAL
  Filled 2024-01-14: qty 1

## 2024-01-14 MED ORDER — MAGNESIUM HYDROXIDE 400 MG/5ML PO SUSP: 30.0000 mL | Freq: Every day | ORAL | Status: DC | PRN

## 2024-01-14 MED ORDER — POTASSIUM CHLORIDE CRYS ER 20 MEQ PO TBCR
40.0000 meq | EXTENDED_RELEASE_TABLET | Freq: Once | ORAL | Status: AC
  Administered 2024-01-14: 40 meq via ORAL
  Filled 2024-01-14: qty 2

## 2024-01-14 MED ORDER — HALOPERIDOL LACTATE 5 MG/ML IJ SOLN: 5.0000 mg | Freq: Three times a day (TID) | INTRAMUSCULAR | Status: DC | PRN

## 2024-01-14 MED ORDER — ADULT MULTIVITAMIN W/MINERALS CH
1.0000 | ORAL_TABLET | Freq: Every day | ORAL | Status: DC
  Administered 2024-01-14 – 2024-01-17 (×4): 1 via ORAL
  Filled 2024-01-14 (×4): qty 1

## 2024-01-14 MED ORDER — CYCLOBENZAPRINE HCL 10 MG PO TABS
5.0000 mg | ORAL_TABLET | Freq: Three times a day (TID) | ORAL | Status: DC | PRN
  Administered 2024-01-14 – 2024-01-15 (×2): 5 mg via ORAL
  Filled 2024-01-14 (×2): qty 1

## 2024-01-14 MED ORDER — NICOTINE POLACRILEX 2 MG MT GUM
2.0000 mg | CHEWING_GUM | OROMUCOSAL | Status: DC
  Administered 2024-01-14 – 2024-01-15 (×3): 2 mg via ORAL
  Filled 2024-01-14 (×5): qty 1

## 2024-01-14 MED ORDER — ALUM & MAG HYDROXIDE-SIMETH 200-200-20 MG/5ML PO SUSP: 30.0000 mL | ORAL | Status: DC | PRN

## 2024-01-14 NOTE — ED Provider Notes (Signed)
 Blood pressure 110/67, pulse 74, temperature 98.4 F (36.9 C), temperature source Oral, resp. rate 14, SpO2 100%.   In short, Mark Hammond is a 33 y.o. male with a chief complaint of Hallucinations .  Refer to the original H&P for additional details.  01:00 AM  Safe transport here for transportation. EMTALA completed by Dr. Zackowski. Reassessment completed by me. Stable for transfer.     Roberts Ching, MD 01/14/24 [phone]

## 2024-01-14 NOTE — ED Provider Notes (Signed)
 Facility Based Crisis Progress Note  Date: 01/14/24 Patient Name: Mark Hammond MRN: [id number] Chief Complaint: Depression, substance use  Diagnoses:  Final diagnoses:  Hypokalemia   Mark Hammond is a 33 yo male with a history of polysubstance use disorder (alcohol, tobacco cocaine, opiates, and cannabis), schizoaffective disorder, self-reported bipolar disorder who initially presented to the [facility] on 01/12/24 voluntarily for complaints of psychosis and requesting detox from ongoing alcohol, cocaine, and cannabis use.  Patient expressed interest in transitioning to a residential rehabilitation program and was admitted to [facility] on 01/14/24 to complete detox.  . Medical hx of chronic left leg pain for which he was prescribed Gabapentin and Flexeril. He has been medically assessed multiple times for this leg pain.  Patient was discharged from the [facility] back in March 2025 and reports relapsing on substances shortly after discharge.  Interval history Patient seen and assessed at bedside.  Denies SI/HI/AVH.  Reports struggling with substance use. Reports being unable to recall what happened that led to him relapsing. Reports minimal withdrawal symptoms at this time. Reports leg pain has been bothering him and he has been on flexeril before and that has been helpful.   Psychiatric ROS Mood Symptoms 38-month history of depressed mood, anhedonia, fatigue, diminished concentration, feelings of worthlessness/hopelessness/guilt, chronic SI without intent or plan Manic Symptoms Reports prior diagnosis of bipolar 1 disorder, with prior manic episodes, although patient is unable to distinguish true periods of expansive energy or mood outside of substance use since he has never had a significant period of sobriety. Anxiety Symptoms Not formally assessed Trauma Symptoms Not formally assessed Psychosis Symptoms Denied AVH, paranoia, delusional thought processes  Substance Use Hx: Alcohol: started at age 67. 1/5th  and 4 40 oz, 2 16 oz, and 2 cartons a  wine daily.  [facility] week [facility] sobriety at [facility] last time.  Dneies seizures or DTs Tobacco: 3 cigarretes daily, started ta age 72 Cannabis:1 bowl of weed a day, smokes it daily - slows his usage of other, appetite Cocaine: smokes, $40 daily Methamphetamines: denies Opiates (fentanyl / heroin): oxycodone and percocets, depending on what is available to him . Reports using approximately 4 pills daily.  IVDU: denies  Past Psychiatric Hx: No current outpatient psychiatric follow-up Patient reports being previously diagnosed with bipolar disorder and depression. No current psychotropic medications Unable to recall  Past Medical History: PCP:No Medical Dx: Chronic unspecified left leg injury Allergies: codeine Trauma:Denies Seizures:Denies  Family Medical History: Unknown to pt  Family Psychiatric History: Unknown to pt  Social History: Patient lives in [place] with girlfriend Social Support: girlfriend Occupational WU:[id number] Children:denies   PHQ 2-9:  Flowsheet Row ED from 01/14/2024 in [facility] ED from 12/08/2023 in [facility] ED from 11/21/2023 in [facility]  Thoughts that you would be better off dead, or of hurting yourself in some way Several days Not at all Not at all  PHQ-9 Total Score 14 0 8       Flowsheet Row ED from 01/14/2024 in [facility] Most recent reading at 01/14/2024 10:55 AM ED from 01/12/2024 in [facility] Emergency Department at [facility] Most recent reading at 01/13/2024 12:33 AM ED from 01/12/2024 in [facility] Emergency Department at [facility] Most recent reading at 01/12/2024 12:41 PM  C-SSRS RISK CATEGORY No Risk Moderate Risk No Risk       Screenings    Flowsheet Row Most Recent Value  CIWA-Ar  Total 2       Total Time spent with patient: 1.5 hours  Psychiatric Specialty  Exam  Presentation General Appearance:  Casual  Eye Contact: Good  Speech: Clear and Coherent  Speech Volume: Normal  Handedness: Right   Mood and Affect  Mood: Depressed  Affect: Congruent   Thought Process  Thought Processes: Coherent; Goal Directed  Descriptions of Associations:Intact  Orientation:Full (Time, Place and Person)  Thought Content:WDL  Diagnosis of Schizophrenia or Schizoaffective disorder in past: Yes   Hallucinations:Hallucinations: Auditory Description of Auditory Hallucinations: "muffled sounds"   Ideas of Reference:None  Suicidal Thoughts:Suicidal Thoughts: No   Homicidal Thoughts:Homicidal Thoughts: No    Sensorium  Memory: Immediate Good; Recent Good; Remote Good  Judgment: Fair  Insight: Good   Executive Functions  Concentration: Good  Attention Span: Good  Recall: Good  Fund of Knowledge: Good  Language: Good   Psychomotor Activity  Psychomotor Activity: Psychomotor Activity: Normal    Assets  Assets: Communication Skills; Desire for Improvement; Physical Health   Sleep  Sleep: Sleep: Fair Number of Hours of Sleep: 6    Nutritional Assessment (For OBS and [facility] admissions only) Has the patient had a weight loss or gain of 10 pounds or more in the last 3 months?: No Has the patient had a decrease in food intake/or appetite?: No Does the patient have dental problems?: No Does the patient have eating habits or behaviors that may be indicators of an eating disorder including binging or inducing vomiting?: No Has the patient recently lost weight without trying?: 0 Has the patient been eating poorly because of a decreased appetite?: 0 Malnutrition Screening Tool Score: 0     Physical Exam Vitals and nursing note reviewed.  Constitutional:      General: He is not in acute distress.    Appearance: He is not ill-appearing.  HENT:     Head: Normocephalic and atraumatic.  Eyes:      Extraocular Movements: Extraocular movements intact.     Conjunctiva/sclera: Conjunctivae normal.  Pulmonary:     Effort: Pulmonary effort is normal. No respiratory distress.  Skin:    General: Skin is warm and dry.  Neurological:     General: No focal deficit present.    Review of Systems  All other systems reviewed and are negative.   Blood pressure 116/75, pulse 88, temperature 98.5 F (36.9 C), temperature source Oral, resp. rate 16, SpO2 98%. There is no height or weight on file to calculate BMI.    Last Labs:  Admission on 01/12/2024, Discharged on 01/14/2024  Component Date Value Ref Range Status   Sodium 01/12/2024 134 (L)  135 - 145 mmol/L Final   Potassium 01/12/2024 2.9 (L)  3.5 - 5.1 mmol/L Final   Chloride 01/12/2024 96 (L)  98 - 111 mmol/L Final   CO2 01/12/2024 26  22 - 32 mmol/L Final   Glucose, Bld 01/12/2024 171 (H)  70 - 99 mg/dL Final   Glucose reference range applies only to samples taken after fasting for at least 8 hours.   BUN 01/12/2024 8  6 - 20 mg/dL Final   Creatinine, Ser 01/12/2024 0.60 (L)  0.61 - 1.24 mg/dL Final   Calcium 16/07/9603 8.5 (L)  8.9 - 10.3 mg/dL Final   Total Protein 54/06/8118 7.5  6.5 - 8.1 g/dL Final   Albumin 14/78/2956 2.8 (L)  3.5 - 5.0 g/dL Final   AST 21/30/8657 49 (H)  15 - 41 U/L Final   ALT 01/12/2024  48 (H)  0 - 44 U/L Final   Alkaline Phosphatase 01/12/2024 112  38 - 126 U/L Final   Total Bilirubin 01/12/2024 0.5  0.0 - 1.2 mg/dL Final   GFR, Estimated 01/12/2024 >60  >60 mL/min Final   Comment: (NOTE) Calculated using the CKD-EPI Creatinine Equation (2021)    Anion gap 01/12/2024 12  5 - 15 Final   Performed at [facility], [address] [place], [place] [address]   Alcohol, Ethyl (B) 01/12/2024 194 (H)  <10 mg/dL Final   Comment: (NOTE) Lowest detectable limit for serum alcohol is 10 mg/dL.  For medical purposes only. Performed at [facility], [address] [place], [place] [address]    Opiates 01/12/2024 POSITIVE (A)  NONE DETECTED Final   Cocaine 01/12/2024 POSITIVE (A)  NONE DETECTED Final   Benzodiazepines 01/12/2024 NONE DETECTED  NONE DETECTED Final   Amphetamines 01/12/2024 NONE DETECTED  NONE DETECTED Final   Tetrahydrocannabinol 01/12/2024 POSITIVE (A)  NONE DETECTED Final   Barbiturates 01/12/2024 NONE DETECTED  NONE DETECTED Final   Comment: (NOTE) DRUG SCREEN FOR MEDICAL PURPOSES ONLY.  IF CONFIRMATION IS NEEDED FOR ANY PURPOSE, NOTIFY LAB WITHIN 5 DAYS.  LOWEST DETECTABLE LIMITS FOR URINE DRUG SCREEN Drug Class                     Cutoff (ng/mL) Amphetamine and metabolites    1000 Barbiturate and metabolites    200 Benzodiazepine                 200 Opiates and metabolites        300 Cocaine and metabolites        300 THC                            50 Performed at [facility], [address] [place], [place] [address]    WBC 01/12/2024 11.3 (H)  4.0 - 10.5 K/uL Final   RBC 01/12/2024 3.99 (L)  4.22 - 5.81 MIL/uL Final   Hemoglobin 01/12/2024 11.7 (L)  13.0 - 17.0 g/dL Final   HCT 03/47/4259 36.1 (L)  39.0 - 52.0 % Final   MCV 01/12/2024 90.5  80.0 - 100.0 fL Final   MCH 01/12/2024 29.3  26.0 - 34.0 pg Final   MCHC 01/12/2024 32.4  30.0 - 36.0 g/dL Final   RDW 56/38/7564 13.6  11.5 - 15.5 % Final   Platelets 01/12/2024 477 (H)  150 - 400 K/uL Final   nRBC 01/12/2024 0.0  0.0 - 0.2 % Final   Neutrophils Relative % 01/12/2024 76  % Final   Neutro Abs 01/12/2024 8.6 (H)  1.7 - 7.7 K/uL Final   Lymphocytes Relative 01/12/2024 16  % Final   Lymphs Abs 01/12/2024 1.8  0.7 - 4.0 K/uL Final   Monocytes Relative 01/12/2024 6  % Final   Monocytes Absolute 01/12/2024 0.7  0.1 - 1.0 K/uL Final   Eosinophils Relative 01/12/2024 1  % Final   Eosinophils Absolute 01/12/2024 0.1  0.0 - 0.5 K/uL Final   Basophils Relative 01/12/2024 1  % Final   Basophils Absolute 01/12/2024 0.1  0.0 - 0.1 K/uL Final    Immature Granulocytes 01/12/2024 0  % Final   Abs Immature Granulocytes 01/12/2024 0.04  0.00 - 0.07 K/uL Final   Performed at [facility], [address], [place], [place] [address]   Sodium  01/13/2024 135  135 - 145 mmol/L Final   Potassium 01/13/2024 3.4 (L)  3.5 - 5.1 mmol/L Final   Chloride 01/13/2024 100  98 - 111 mmol/L Final   CO2 01/13/2024 28  22 - 32 mmol/L Final   Glucose, Bld 01/13/2024 106 (H)  70 - 99 mg/dL Final   Glucose reference range applies only to samples taken after fasting for at least 8 hours.   BUN 01/13/2024 8  6 - 20 mg/dL Final   Creatinine, Ser 01/13/2024 0.34 (L)  0.61 - 1.24 mg/dL Final   Calcium 16/07/9603 7.9 (L)  8.9 - 10.3 mg/dL Final   Total Protein 54/06/8118 7.1  6.5 - 8.1 g/dL Final   Albumin 14/78/2956 2.6 (L)  3.5 - 5.0 g/dL Final   AST 21/30/8657 41  15 - 41 U/L Final   ALT 01/13/2024 43  0 - 44 U/L Final   Alkaline Phosphatase 01/13/2024 125  38 - 126 U/L Final   Total Bilirubin 01/13/2024 0.3  0.0 - 1.2 mg/dL Final   GFR, Estimated 01/13/2024 >60  >60 mL/min Final   Comment: (NOTE) Calculated using the CKD-EPI Creatinine Equation (2021)    Anion gap 01/13/2024 7  5 - 15 Final   Performed at [facility], [address], [place], [place] [address]  Admission on 01/05/2024, Discharged on 01/06/2024  Component Date Value Ref Range Status   Sodium 01/05/2024 131 (L)  135 - 145 mmol/L Final   Potassium 01/05/2024 3.4 (L)  3.5 - 5.1 mmol/L Final   Chloride 01/05/2024 96 (L)  98 - 111 mmol/L Final   CO2 01/05/2024 26  22 - 32 mmol/L Final   Glucose, Bld 01/05/2024 118 (H)  70 - 99 mg/dL Final   Glucose reference range applies only to samples taken after fasting for at least 8 hours.   BUN 01/05/2024 7  6 - 20 mg/dL Final   Creatinine, Ser 01/05/2024 0.51 (L)  0.61 - 1.24 mg/dL Final   Calcium 29/52/8413 8.6 (L)  8.9 - 10.3 mg/dL Final   GFR, Estimated 01/05/2024 >60  >60 mL/min Final   Comment:  (NOTE) Calculated using the CKD-EPI Creatinine Equation (2021)    Anion gap 01/05/2024 9  5 - 15 Final   Performed at [facility], [address] [place], [place] [address]   WBC 01/05/2024 8.9  4.0 - 10.5 K/uL Final   RBC 01/05/2024 3.56 (L)  4.22 - 5.81 MIL/uL Final   Hemoglobin 01/05/2024 10.7 (L)  13.0 - 17.0 g/dL Final   HCT 02/72/5366 32.3 (L)  39.0 - 52.0 % Final   MCV 01/05/2024 90.7  80.0 - 100.0 fL Final   MCH 01/05/2024 30.1  26.0 - 34.0 pg Final   MCHC 01/05/2024 33.1  30.0 - 36.0 g/dL Final   RDW 44/12/4740 13.1  11.5 - 15.5 % Final   Platelets 01/05/2024 349  150 - 400 K/uL Final   nRBC 01/05/2024 0.0  0.0 - 0.2 % Final   Performed at [facility], [address] [place], [place] [address]   Color, Urine 01/06/2024 YELLOW  YELLOW Final   APPearance 01/06/2024 CLEAR  CLEAR Final   Specific Gravity, Urine 01/06/2024 1.013  1.005 - 1.030 Final   pH 01/06/2024 5.0  5.0 - 8.0 Final   Glucose, UA 01/06/2024 NEGATIVE  NEGATIVE mg/dL Final   Hgb urine dipstick 01/06/2024 NEGATIVE  NEGATIVE Final   Bilirubin Urine 01/06/2024 NEGATIVE  NEGATIVE Final  Ketones, ur 01/06/2024 NEGATIVE  NEGATIVE mg/dL Final   Protein, ur 66/44/0347 NEGATIVE  NEGATIVE mg/dL Final   Nitrite 42/59/5638 NEGATIVE  NEGATIVE Final   Leukocytes,Ua 01/06/2024 NEGATIVE  NEGATIVE Final   Performed at [facility], [address] [place], [place] [address]   Glucose-Capillary 01/05/2024 113 (H)  70 - 99 mg/dL Final   Glucose reference range applies only to samples taken after fasting for at least 8 hours.   Total CK 01/05/2024 116  49 - 397 U/L Final   Performed at [facility], [address] [place], [place] [address]  Admission on 12/13/2023, Discharged on 12/15/2023  Component Date Value Ref Range Status   WBC 12/13/2023 10.4  4.0 - 10.5 K/uL Final   RBC 12/13/2023 4.17 (L)  4.22 - 5.81 MIL/uL Final   Hemoglobin 12/13/2023  12.8 (L)  13.0 - 17.0 g/dL Final   HCT 88/41/6606 39.2  39.0 - 52.0 % Final   MCV 12/13/2023 94.0  80.0 - 100.0 fL Final   MCH 12/13/2023 30.7  26.0 - 34.0 pg Final   MCHC 12/13/2023 32.7  30.0 - 36.0 g/dL Final   RDW 30/16/0109 14.2  11.5 - 15.5 % Final   Platelets 12/13/2023 360  150 - 400 K/uL Final   nRBC 12/13/2023 0.0  0.0 - 0.2 % Final   Neutrophils Relative % 12/13/2023 62  % Final   Neutro Abs 12/13/2023 6.4  1.7 - 7.7 K/uL Final   Lymphocytes Relative 12/13/2023 25  % Final   Lymphs Abs 12/13/2023 2.6  0.7 - 4.0 K/uL Final   Monocytes Relative 12/13/2023 11  % Final   Monocytes Absolute 12/13/2023 1.2 (H)  0.1 - 1.0 K/uL Final   Eosinophils Relative 12/13/2023 1  % Final   Eosinophils Absolute 12/13/2023 0.2  0.0 - 0.5 K/uL Final   Basophils Relative 12/13/2023 1  % Final   Basophils Absolute 12/13/2023 0.1  0.0 - 0.1 K/uL Final   Immature Granulocytes 12/13/2023 0  % Final   Abs Immature Granulocytes 12/13/2023 0.04  0.00 - 0.07 K/uL Final   Performed at [facility], [address] [place], [place] [address]   Sodium 12/13/2023 137  135 - 145 mmol/L Final   Potassium 12/13/2023 3.9  3.5 - 5.1 mmol/L Final   Chloride 12/13/2023 104  98 - 111 mmol/L Final   CO2 12/13/2023 23  22 - 32 mmol/L Final   Glucose, Bld 12/13/2023 107 (H)  70 - 99 mg/dL Final   Glucose reference range applies only to samples taken after fasting for at least 8 hours.   BUN 12/13/2023 11  6 - 20 mg/dL Final   Creatinine, Ser 12/13/2023 0.62  0.61 - 1.24 mg/dL Final   Calcium 73/22/0254 8.8 (L)  8.9 - 10.3 mg/dL Final   Total Protein 27/03/2375 8.1  6.5 - 8.1 g/dL Final   Albumin 28/31/5176 2.9 (L)  3.5 - 5.0 g/dL Final   AST 16/04/3709 155 (H)  15 - 41 U/L Final   ALT 12/13/2023 118 (H)  0 - 44 U/L Final   Alkaline Phosphatase 12/13/2023 128 (H)  38 - 126 U/L Final   Total Bilirubin 12/13/2023 0.4  0.0 - 1.2 mg/dL Final   GFR, Estimated 12/13/2023 >60  >60 mL/min Final    Comment: (NOTE) Calculated using the CKD-EPI Creatinine Equation (2021)    Anion gap 12/13/2023 10  5 - 15 Final   Performed at [facility],  [address], [place], [place] [address]   Alcohol, Ethyl (B) 12/13/2023 204 (H)  <10 mg/dL Final   Comment: (NOTE) Lowest detectable limit for serum alcohol is 10 mg/dL.  For medical purposes only. Performed at [facility], [address] [place], [place] [address]    Salicylate Lvl 12/13/2023 <7.0 (L)  7.0 - 30.0 mg/dL Final   Performed at [facility], [address] [place], [place] [address]   Acetaminophen (Tylenol), Serum 12/13/2023 11  10 - 30 ug/mL Final   Comment: (NOTE) Therapeutic concentrations vary significantly. A range of 10-30 ug/mL  may be an effective concentration for many patients. However, some  are best treated at concentrations outside of this range. Acetaminophen concentrations >150 ug/mL at 4 hours after ingestion  and >50 ug/mL at 12 hours after ingestion are often associated with  toxic reactions.  Performed at [facility], [address] [place], [place] [address]    Opiates 12/13/2023 POSITIVE (A)  NONE DETECTED Final   Cocaine 12/13/2023 POSITIVE (A)  NONE DETECTED Final   Benzodiazepines 12/13/2023 POSITIVE (A)  NONE DETECTED Final   Amphetamines 12/13/2023 NONE DETECTED  NONE DETECTED Final   Tetrahydrocannabinol 12/13/2023 POSITIVE (A)  NONE DETECTED Final   Barbiturates 12/13/2023 NONE DETECTED  NONE DETECTED Final   Comment: (NOTE) DRUG SCREEN FOR MEDICAL PURPOSES ONLY.  IF CONFIRMATION IS NEEDED FOR ANY PURPOSE, NOTIFY LAB WITHIN 5 DAYS.  LOWEST DETECTABLE LIMITS FOR URINE DRUG SCREEN Drug Class                     Cutoff (ng/mL) Amphetamine and metabolites    1000 Barbiturate and metabolites    200 Benzodiazepine                 200 Opiates and metabolites        300 Cocaine and metabolites        300 THC                             50 Performed at [facility], [address] [place], [place] [address]   Admission on 12/08/2023, Discharged on 12/12/2023  Component Date Value Ref Range Status   Opiates 12/08/2023 NONE DETECTED  NONE DETECTED Final   Cocaine 12/08/2023 NONE DETECTED  NONE DETECTED Final   Benzodiazepines 12/08/2023 POSITIVE (A)  NONE DETECTED Final   Amphetamines 12/08/2023 NONE DETECTED  NONE DETECTED Final   Tetrahydrocannabinol 12/08/2023 POSITIVE (A)  NONE DETECTED Final   Barbiturates 12/08/2023 NONE DETECTED  NONE DETECTED Final   Comment: (NOTE) DRUG SCREEN FOR MEDICAL PURPOSES ONLY.  IF CONFIRMATION IS NEEDED FOR ANY PURPOSE, NOTIFY LAB WITHIN 5 DAYS.  LOWEST DETECTABLE LIMITS FOR URINE DRUG SCREEN Drug Class                     Cutoff (ng/mL) Amphetamine and metabolites    1000 Barbiturate and metabolites    200 Benzodiazepine                 200 Opiates and metabolites        300 Cocaine and metabolites        300 THC                            50 Performed at [facility], [address] [place], [place] [address]   Admission on 12/06/2023, Discharged on 12/08/2023  Component Date Value Ref Range Status   Alcohol, Ethyl (B) 12/06/2023 <10  <10 mg/dL Final   Comment: (NOTE) Lowest detectable limit for serum alcohol is 10 mg/dL.  For medical purposes only. Performed at [facility], [address] [place], [place] [address]    POC Amphetamine UR 12/06/2023 None Detected  NONE DETECTED (Cut Off Level 1000 ng/mL) Final   POC Secobarbital (BAR) 12/06/2023 None Detected  NONE DETECTED (Cut Off Level 300 ng/mL) Final   POC Buprenorphine (BUP) 12/06/2023 None Detected  NONE DETECTED (Cut Off Level 10 ng/mL) Final   POC Oxazepam (BZO) 12/06/2023 Positive (A)  NONE DETECTED (Cut Off Level 300 ng/mL) Final   POC Cocaine UR 12/06/2023 Positive (A)  NONE DETECTED (Cut Off Level 300 ng/mL) Final   POC Methamphetamine UR 12/06/2023  None Detected  NONE DETECTED (Cut Off Level 1000 ng/mL) Final   POC Morphine 12/06/2023 None Detected  NONE DETECTED (Cut Off Level 300 ng/mL) Final   POC Methadone UR 12/06/2023 None Detected  NONE DETECTED (Cut Off Level 300 ng/mL) Final   POC Oxycodone UR 12/06/2023 None Detected  NONE DETECTED (Cut Off Level 100 ng/mL) Final   POC Marijuana UR 12/06/2023 Positive (A)  NONE DETECTED (Cut Off Level 50 ng/mL) Final   WBC 12/08/2023 4.4  4.0 - 10.5 K/uL Final   RBC 12/08/2023 3.53 (L)  4.22 - 5.81 MIL/uL Final   Hemoglobin 12/08/2023 11.2 (L)  13.0 - 17.0 g/dL Final   HCT 09/81/1914 32.6 (L)  39.0 - 52.0 % Final   MCV 12/08/2023 92.4  80.0 - 100.0 fL Final   MCH 12/08/2023 31.7  26.0 - 34.0 pg Final   MCHC 12/08/2023 34.4  30.0 - 36.0 g/dL Final   RDW 78/29/5621 13.9  11.5 - 15.5 % Final   Platelets 12/08/2023 280  150 - 400 K/uL Final   nRBC 12/08/2023 0.0  0.0 - 0.2 % Final   Neutrophils Relative % 12/08/2023 59  % Final   Neutro Abs 12/08/2023 2.6  1.7 - 7.7 K/uL Final   Lymphocytes Relative 12/08/2023 27  % Final   Lymphs Abs 12/08/2023 1.2  0.7 - 4.0 K/uL Final   Monocytes Relative 12/08/2023 9  % Final   Monocytes Absolute 12/08/2023 0.4  0.1 - 1.0 K/uL Final   Eosinophils Relative 12/08/2023 3  % Final   Eosinophils Absolute 12/08/2023 0.1  0.0 - 0.5 K/uL Final   Basophils Relative 12/08/2023 1  % Final   Basophils Absolute 12/08/2023 0.1  0.0 - 0.1 K/uL Final   Immature Granulocytes 12/08/2023 1  % Final   Abs Immature Granulocytes 12/08/2023 0.02  0.00 - 0.07 K/uL Final   Performed at [facility], [address] [place], [place] [address]   Sodium 12/08/2023 139  135 - 145 mmol/L Final   Potassium 12/08/2023 4.0  3.5 - 5.1 mmol/L Final   Chloride 12/08/2023 100  98 - 111 mmol/L Final   CO2 12/08/2023 27  22 - 32 mmol/L Final   Glucose, Bld 12/08/2023 114 (H)  70 - 99 mg/dL Final   Glucose reference range applies only to samples taken after fasting for at least 8  hours.   BUN 12/08/2023 9  6 - 20 mg/dL Final   Creatinine, Ser 12/08/2023 0.60 (L)  0.61 - 1.24 mg/dL Final   Calcium 78/46/9629 8.7 (L)  8.9 - 10.3 mg/dL Final  Total Protein 12/08/2023 6.2 (L)  6.5 - 8.1 g/dL Final   Albumin 16/07/9603 2.3 (L)  3.5 - 5.0 g/dL Final   AST 54/06/8118 101 (H)  15 - 41 U/L Final   ALT 12/08/2023 55 (H)  0 - 44 U/L Final   Alkaline Phosphatase 12/08/2023 125  38 - 126 U/L Final   Total Bilirubin 12/08/2023 0.4  0.0 - 1.2 mg/dL Final   GFR, Estimated 12/08/2023 >60  >60 mL/min Final   Comment: (NOTE) Calculated using the CKD-EPI Creatinine Equation (2021)    Anion gap 12/08/2023 12  5 - 15 Final   Performed at [facility], [address] [place], [place] [address]   Cholesterol 12/08/2023 96  0 - 200 mg/dL Final   Triglycerides 95/62/1308 55  <150 mg/dL Final   HDL 65/78/4696 34 (L)  >40 mg/dL Final   Total CHOL/HDL Ratio 12/08/2023 2.8  RATIO Final   VLDL 12/08/2023 11  0 - 40 mg/dL Final   LDL Cholesterol 12/08/2023 51  0 - 99 mg/dL Final   Comment:        Total Cholesterol/HDL:CHD Risk Coronary Heart Disease Risk Table                     Men   Women  1/2 Average Risk   3.4   3.3  Average Risk       5.0   4.4  2 X Average Risk   9.6   7.1  3 X Average Risk  23.4   11.0        Use the calculated Patient Ratio above and the CHD Risk Table to determine the patient's CHD Risk.        ATP III CLASSIFICATION (LDL):  <100     mg/dL   Optimal  295-284  mg/dL   Near or Above                    Optimal  130-159  mg/dL   Borderline  132-440  mg/dL   High  >102     mg/dL   Very High Performed at [facility], [address], [place], [place] [address]    Magnesium 12/08/2023 1.7  1.7 - 2.4 mg/dL Final   Performed at [facility], [address], [place], [place] [address]   TSH 12/08/2023 0.714  0.350 - 4.500 uIU/mL Final   Comment: Performed by a 3rd Generation assay with a functional sensitivity of <=0.01 uIU/mL. Performed  at [facility], [address] [place], [place] [address]    Hgb A1c MFr Bld 12/08/2023 6.0 (H)  4.8 - 5.6 % Final   Comment: (NOTE) Pre diabetes:          5.7%-6.4%  Diabetes:              >6.4%  Glycemic control for   <7.0% adults with diabetes    Mean Plasma Glucose 12/08/2023 125.5  mg/dL Final   Performed at [facility], [address], [place], [place] [address]  Admission on 12/04/2023, Discharged on 12/04/2023  Component Date Value Ref Range Status   Color, Urine 12/04/2023 YELLOW  YELLOW Final   APPearance 12/04/2023 CLEAR  CLEAR Final   Specific Gravity, Urine 12/04/2023 1.029  1.005 - 1.030 Final   pH 12/04/2023 5.0  5.0 - 8.0 Final   Glucose, UA 12/04/2023 NEGATIVE  NEGATIVE mg/dL Final   Hgb urine dipstick  12/04/2023 NEGATIVE  NEGATIVE Final   Bilirubin Urine 12/04/2023 NEGATIVE  NEGATIVE Final   Ketones, ur 12/04/2023 NEGATIVE  NEGATIVE mg/dL Final   Protein, ur 95/28/4132 NEGATIVE  NEGATIVE mg/dL Final   Nitrite 44/10/270 NEGATIVE  NEGATIVE Final   Leukocytes,Ua 12/04/2023 NEGATIVE  NEGATIVE Final   Performed at [facility], [address] [place], [place] [address]  Admission on 11/27/2023, Discharged on 11/28/2023  Component Date Value Ref Range Status   WBC 11/27/2023 13.5 (H)  4.0 - 10.5 K/uL Final   RBC 11/27/2023 4.12 (L)  4.22 - 5.81 MIL/uL Final   Hemoglobin 11/27/2023 12.8 (L)  13.0 - 17.0 g/dL Final   HCT 40/34/7425 38.4 (L)  39.0 - 52.0 % Final   MCV 11/27/2023 93.2  80.0 - 100.0 fL Final   MCH 11/27/2023 31.1  26.0 - 34.0 pg Final   MCHC 11/27/2023 33.3  30.0 - 36.0 g/dL Final   RDW 95/63/8756 13.4  11.5 - 15.5 % Final   Platelets 11/27/2023 375  150 - 400 K/uL Final   nRBC 11/27/2023 0.0  0.0 - 0.2 % Final   Performed at [facility], [address], [place], [place] [address]   Sodium 11/27/2023 132 (L)  135 - 145 mmol/L Final   Potassium 11/27/2023 4.3  3.5 - 5.1 mmol/L Final   Chloride 11/27/2023 95  (L)  98 - 111 mmol/L Final   CO2 11/27/2023 21 (L)  22 - 32 mmol/L Final   Glucose, Bld 11/27/2023 113 (H)  70 - 99 mg/dL Final   Glucose reference range applies only to samples taken after fasting for at least 8 hours.   BUN 11/27/2023 15  6 - 20 mg/dL Final   Creatinine, Ser 11/27/2023 0.86  0.61 - 1.24 mg/dL Final   Calcium 51/88/4166 9.5  8.9 - 10.3 mg/dL Final   GFR, Estimated 11/27/2023 >60  >60 mL/min Final   Comment: (NOTE) Calculated using the CKD-EPI Creatinine Equation (2021)    Anion gap 11/27/2023 16 (H)  5 - 15 Final   Performed at [facility], [address], [place], [place] [address]   Salicylate Lvl 11/27/2023 <7.0 (L)  7.0 - 30.0 mg/dL Final   Performed at [facility], [address] [place], [place] [address]   Acetaminophen (Tylenol), Serum 11/27/2023 10  10 - 30 ug/mL Final   Comment: (NOTE) Therapeutic concentrations vary significantly. A range of 10-30 ug/mL  may be an effective concentration for many patients. However, some  are best treated at concentrations outside of this range. Acetaminophen concentrations >150 ug/mL at 4 hours after ingestion  and >50 ug/mL at 12 hours after ingestion are often associated with  toxic reactions.  Performed at [facility], [address] [place], [place] [address]    Alcohol, Ethyl (B) 11/27/2023 127 (H)  <10 mg/dL Final   Comment: (NOTE) Lowest detectable limit for serum alcohol is 10 mg/dL.  For medical purposes only. Performed at [facility], [address], [place], [place] [address]    Opiates 11/27/2023 POSITIVE (A)  NONE DETECTED Final   Cocaine 11/27/2023 POSITIVE (A)  NONE DETECTED Final   Benzodiazepines 11/27/2023 POSITIVE (A)  NONE DETECTED Final   Amphetamines 11/27/2023 NONE DETECTED  NONE DETECTED Final   Tetrahydrocannabinol 11/27/2023 POSITIVE (A)  NONE DETECTED Final   Barbiturates 11/27/2023 NONE DETECTED  NONE DETECTED Final   Comment: (NOTE) DRUG SCREEN FOR  MEDICAL PURPOSES ONLY.  IF CONFIRMATION IS NEEDED FOR ANY PURPOSE, NOTIFY LAB WITHIN 5 DAYS.  LOWEST DETECTABLE LIMITS FOR URINE DRUG SCREEN Drug Class                     Cutoff (ng/mL) Amphetamine and metabolites    1000 Barbiturate and metabolites    200 Benzodiazepine                 200 Opiates and metabolites        300 Cocaine and metabolites        300 THC                            50 Performed at [facility], [address] [place], [place] [address]    Color, Urine 11/27/2023 AMBER (A)  YELLOW Final   BIOCHEMICALS MAY BE AFFECTED BY COLOR   APPearance 11/27/2023 CLEAR  CLEAR Final   Specific Gravity, Urine 11/27/2023 1.020  1.005 - 1.030 Final   pH 11/27/2023 5.0  5.0 - 8.0 Final   Glucose, UA 11/27/2023 NEGATIVE  NEGATIVE mg/dL Final   Hgb urine dipstick 11/27/2023 NEGATIVE  NEGATIVE Final   Bilirubin Urine 11/27/2023 NEGATIVE  NEGATIVE Final   Ketones, ur 11/27/2023 NEGATIVE  NEGATIVE mg/dL Final   Protein, ur 60/45/4098 NEGATIVE  NEGATIVE mg/dL Final   Nitrite 11/91/4782 NEGATIVE  NEGATIVE Final   Leukocytes,Ua 11/27/2023 NEGATIVE  NEGATIVE Final   Performed at [facility], [address], [place], [place] [address]   Neisseria Gonorrhea 11/27/2023 Negative   Final   Chlamydia 11/27/2023 Negative   Final   Comment 11/27/2023 Normal Reference Ranger Chlamydia - Negative   Final   Comment 11/27/2023 Normal Reference Range Neisseria Gonorrhea - Negative   Final  Admission on 11/21/2023, Discharged on 11/27/2023  Component Date Value Ref Range Status   SARS Coronavirus 2 by RT PCR 11/25/2023 NEGATIVE  NEGATIVE Final   Performed at [facility], [address], [place], [place] [address]   WBC 11/21/2023 5.6  4.0 - 10.5 K/uL Final   RBC 11/21/2023 4.14 (L)  4.22 - 5.81 MIL/uL Final   Hemoglobin 11/21/2023 13.1  13.0 - 17.0 g/dL Final   HCT 78/46/9629 38.8 (L)  39.0 - 52.0 % Final   MCV 11/21/2023 93.7  80.0 - 100.0 fL Final   MCH 11/21/2023  31.6  26.0 - 34.0 pg Final   MCHC 11/21/2023 33.8  30.0 - 36.0 g/dL Final   RDW 52/84/1324 13.2  11.5 - 15.5 % Final   Platelets 11/21/2023 298  150 - 400 K/uL Final   nRBC 11/21/2023 0.0  0.0 - 0.2 % Final   Neutrophils Relative % 11/21/2023 83  % Final   Neutro Abs 11/21/2023 4.6  1.7 - 7.7 K/uL Final   Lymphocytes Relative 11/21/2023 14  % Final   Lymphs Abs 11/21/2023 0.8  0.7 - 4.0 K/uL Final   Monocytes Relative 11/21/2023 3  % Final   Monocytes Absolute 11/21/2023 0.2  0.1 - 1.0 K/uL Final   Eosinophils Relative 11/21/2023 0  % Final   Eosinophils Absolute 11/21/2023 0.0  0.0 - 0.5 K/uL Final   Basophils Relative 11/21/2023 0  % Final   Basophils Absolute 11/21/2023 0.0  0.0 - 0.1 K/uL Final   Immature Granulocytes 11/21/2023 0  % Final   Abs Immature Granulocytes 11/21/2023 0.01  0.00 - 0.07 K/uL Final   Performed at  [facility], [address] [place], [place], [place] [address]   Sodium 11/21/2023 135  135 - 145 mmol/L Final   Potassium 11/21/2023 4.2  3.5 - 5.1 mmol/L Final   Chloride 11/21/2023 99  98 - 111 mmol/L Final   CO2 11/21/2023 26  22 - 32 mmol/L Final   Glucose, Bld 11/21/2023 162 (H)  70 - 99 mg/dL Final   Glucose reference range applies only to samples taken after fasting for at least 8 hours.   BUN 11/21/2023 7  6 - 20 mg/dL Final   Creatinine, Ser 11/21/2023 0.63  0.61 - 1.24 mg/dL Final   Calcium 60/45/4098 8.9  8.9 - 10.3 mg/dL Final   Total Protein 11/91/4782 8.1  6.5 - 8.1 g/dL Final   Albumin 95/62/1308 2.8 (L)  3.5 - 5.0 g/dL Final   AST 65/78/4696 91 (H)  15 - 41 U/L Final   ALT 11/21/2023 55 (H)  0 - 44 U/L Final   Alkaline Phosphatase 11/21/2023 87  38 - 126 U/L Final   Total Bilirubin 11/21/2023 0.8  0.0 - 1.2 mg/dL Final   GFR, Estimated 11/21/2023 >60  >60 mL/min Final   Comment: (NOTE) Calculated using the CKD-EPI Creatinine Equation (2021)    Anion gap 11/21/2023 10  5 - 15 Final   Performed at [facility], [address]  [place], [place] [address]   Alcohol, Ethyl (B) 11/21/2023 <10  <10 mg/dL Final   Comment: (NOTE) Lowest detectable limit for serum alcohol is 10 mg/dL.  For medical purposes only. Performed at [facility], [address] [place], [place] [address]    TSH 11/21/2023 0.365  0.350 - 4.500 uIU/mL Final   Comment: Performed by a 3rd Generation assay with a functional sensitivity of <=0.01 uIU/mL. Performed at [facility], [address] [place], [place] [address]    POC Amphetamine UR 11/22/2023 None Detected  NONE DETECTED (Cut Off Level 1000 ng/mL) Final   POC Secobarbital (BAR) 11/22/2023 None Detected  NONE DETECTED (Cut Off Level 300 ng/mL) Final   POC Buprenorphine (BUP) 11/22/2023 None Detected  NONE DETECTED (Cut Off Level 10 ng/mL) Final   POC Oxazepam (BZO) 11/22/2023 Positive (A)  NONE DETECTED (Cut Off Level 300 ng/mL) Final   POC Cocaine UR 11/22/2023 Positive (A)  NONE DETECTED (Cut Off Level 300 ng/mL) Final   POC Methamphetamine UR 11/22/2023 None Detected  NONE DETECTED (Cut Off Level 1000 ng/mL) Final   POC Morphine 11/22/2023 None Detected  NONE DETECTED (Cut Off Level 300 ng/mL) Final   POC Methadone UR 11/22/2023 None Detected  NONE DETECTED (Cut Off Level 300 ng/mL) Final   POC Oxycodone UR 11/22/2023 None Detected  NONE DETECTED (Cut Off Level 100 ng/mL) Final   POC Marijuana UR 11/22/2023 Positive (A)  NONE DETECTED (Cut Off Level 50 ng/mL) Final   Hgb A1c MFr Bld 11/25/2023 5.9 (H)  4.8 - 5.6 % Final   Comment: (NOTE) Pre diabetes:          5.7%-6.4%  Diabetes:              >6.4%  Glycemic control for   <7.0% adults with diabetes    Mean Plasma Glucose 11/25/2023 122.63  mg/dL Final   Performed at [facility], [address] [place], [place] [address]   Sodium 11/25/2023 136  135 - 145 mmol/L Final   Potassium 11/25/2023 4.2  3.5 - 5.1 mmol/L Final  Chloride 11/25/2023 101  98 - 111 mmol/L Final   CO2 11/25/2023 27  22 - 32 mmol/L Final    Glucose, Bld 11/25/2023 108 (H)  70 - 99 mg/dL Final   Glucose reference range applies only to samples taken after fasting for at least 8 hours.   BUN 11/25/2023 9  6 - 20 mg/dL Final   Creatinine, Ser 11/25/2023 0.68  0.61 - 1.24 mg/dL Final   Calcium 36/64/4034 8.6 (L)  8.9 - 10.3 mg/dL Final   Total Protein 74/25/9563 7.0  6.5 - 8.1 g/dL Final   Albumin 87/56/4332 2.3 (L)  3.5 - 5.0 g/dL Final   AST 95/18/8416 81 (H)  15 - 41 U/L Final   ALT 11/25/2023 58 (H)  0 - 44 U/L Final   Alkaline Phosphatase 11/25/2023 84  38 - 126 U/L Final   Total Bilirubin 11/25/2023 0.7  0.0 - 1.2 mg/dL Final   GFR, Estimated 11/25/2023 >60  >60 mL/min Final   Comment: (NOTE) Calculated using the CKD-EPI Creatinine Equation (2021)    Anion gap 11/25/2023 8  5 - 15 Final   Performed at [facility], [address] [place], [place] [address]   Iron 11/25/2023 21 (L)  45 - 182 ug/dL Final   TIBC 16/10/930 280  250 - 450 ug/dL Final   Saturation Ratios 11/25/2023 8 (L)  17.9 - 39.5 % Final   UIBC 11/25/2023 259  ug/dL Final   Performed at [facility], [address], [place], [place] [address]   Vitamin B-12 11/25/2023 549  180 - 914 pg/mL Final   Comment: (NOTE) This assay is not validated for testing neonatal or myeloproliferative syndrome specimens for Vitamin B12 levels. Performed at [facility], [address] [place], [place] [address]    Ferritin 11/25/2023 217  24 - 336 ng/mL Final   Performed at [facility], [address], [place], [place] [address]  Admission on 11/19/2023, Discharged on 11/20/2023  Component Date Value Ref Range Status   Sodium 11/19/2023 138  135 - 145 mmol/L Final   Potassium 11/19/2023 3.4 (L)  3.5 - 5.1 mmol/L Final   Chloride 11/19/2023 103  98 - 111 mmol/L Final   CO2 11/19/2023 26  22 - 32 mmol/L Final   Glucose, Bld 11/19/2023 123 (H)  70 - 99 mg/dL Final   Glucose reference range applies only to samples taken after fasting for at  least 8 hours.   BUN 11/19/2023 <5 (L)  6 - 20 mg/dL Final   Creatinine, Ser 11/19/2023 0.50 (L)  0.61 - 1.24 mg/dL Final   Calcium 23/76/2831 8.2 (L)  8.9 - 10.3 mg/dL Final   GFR, Estimated 11/19/2023 >60  >60 mL/min Final   Comment: (NOTE) Calculated using the CKD-EPI Creatinine Equation (2021)    Anion gap 11/19/2023 9  5 - 15 Final   Performed at [facility], [address] [place], [place] [address]   WBC 11/19/2023 8.4  4.0 - 10.5 K/uL Final   RBC 11/19/2023 4.00 (L)  4.22 - 5.81 MIL/uL Final   Hemoglobin 11/19/2023 12.6 (L)  13.0 - 17.0 g/dL Final   HCT 60/73/7106 37.7 (L)  39.0 - 52.0 % Final   MCV 11/19/2023 94.3  80.0 - 100.0 fL Final   MCH 11/19/2023 31.5  26.0 - 34.0 pg Final   MCHC 11/19/2023 33.4  30.0 - 36.0 g/dL Final   RDW 26/94/8546 13.2  11.5 -  15.5 % Final   Platelets 11/19/2023 267  150 - 400 K/uL Final   nRBC 11/19/2023 0.0  0.0 - 0.2 % Final   Neutrophils Relative % 11/19/2023 65  % Final   Neutro Abs 11/19/2023 5.5  1.7 - 7.7 K/uL Final   Lymphocytes Relative 11/19/2023 22  % Final   Lymphs Abs 11/19/2023 1.9  0.7 - 4.0 K/uL Final   Monocytes Relative 11/19/2023 11  % Final   Monocytes Absolute 11/19/2023 0.9  0.1 - 1.0 K/uL Final   Eosinophils Relative 11/19/2023 1  % Final   Eosinophils Absolute 11/19/2023 0.1  0.0 - 0.5 K/uL Final   Basophils Relative 11/19/2023 1  % Final   Basophils Absolute 11/19/2023 0.1  0.0 - 0.1 K/uL Final   Immature Granulocytes 11/19/2023 0  % Final   Abs Immature Granulocytes 11/19/2023 0.03  0.00 - 0.07 K/uL Final   Performed at [facility], [address] [place], [place] [address]   Sed Rate 11/19/2023 77 (H)  0 - 16 mm/hr Final   Performed at [facility], [address] [place], [place] [address]   CRP 11/19/2023 4.2 (H)  <1.0 mg/dL Final   Performed at [facility], [address], [place], [place] [address]   Sodium 11/19/2023 141  135 - 145 mmol/L  Final   Potassium 11/19/2023 3.5  3.5 - 5.1 mmol/L Final   Chloride 11/19/2023 101  98 - 111 mmol/L Final   BUN 11/19/2023 <3 (L)  6 - 20 mg/dL Final   Creatinine, Ser 11/19/2023 0.70  0.61 - 1.24 mg/dL Final   Glucose, Bld 21/30/8657 126 (H)  70 - 99 mg/dL Final   Glucose reference range applies only to samples taken after fasting for at least 8 hours.   Calcium, Ion 11/19/2023 1.05 (L)  1.15 - 1.40 mmol/L Final   TCO2 11/19/2023 27  22 - 32 mmol/L Final   Hemoglobin 11/19/2023 12.9 (L)  13.0 - 17.0 g/dL Final   HCT 84/69/6295 38.0 (L)  39.0 - 52.0 % Final   SARS Coronavirus 2 by RT PCR 11/19/2023 NEGATIVE  NEGATIVE Final   Comment: (NOTE) SARS-CoV-2 target nucleic acids are NOT DETECTED.  The SARS-CoV-2 RNA is generally detectable in upper respiratory specimens during the acute phase of infection. The lowest concentration of SARS-CoV-2 viral copies this assay can detect is 138 copies/mL. A negative result does not preclude SARS-Cov-2 infection and should not be used as the sole basis for treatment or other patient management decisions. A negative result may occur with  improper specimen collection/handling, submission of specimen other than nasopharyngeal swab, presence of viral mutation(s) within the areas targeted by this assay, and inadequate number of viral copies(<138 copies/mL). A negative result must be combined with clinical observations, patient history, and epidemiological information. The expected result is Negative.  Fact Sheet for Patients:  BloggerCourse.com  Fact Sheet for Healthcare Providers:  SeriousBroker.it  This test is no                          t yet approved or cleared by the [place]  FDA and  has been authorized for detection and/or diagnosis of SARS-CoV-2 by FDA under an Emergency Use Authorization (EUA). This EUA will remain  in effect (meaning this test can be used) for the duration of  the COVID-19 declaration under Section 564(b)(1) of the Act, 21 U.S.C.section 360bbb-3(b)(1), unless the authorization is terminated  or revoked sooner.       Influenza A by PCR 11/19/2023 NEGATIVE  NEGATIVE Final   Influenza B by PCR 11/19/2023 NEGATIVE  NEGATIVE Final   Comment: (NOTE) The Xpert Xpress SARS-CoV-2/FLU/RSV plus assay is intended as an aid in the diagnosis of influenza from Nasopharyngeal swab specimens and should not be used as a sole basis for treatment. Nasal washings and aspirates are unacceptable for Xpert Xpress SARS-CoV-2/FLU/RSV testing.  Fact Sheet for Patients: BloggerCourse.com  Fact Sheet for Healthcare Providers: SeriousBroker.it  This test is not yet approved or cleared by the [place]  FDA and has been authorized for detection and/or diagnosis of SARS-CoV-2 by FDA under an Emergency Use Authorization (EUA). This EUA will remain in effect (meaning this test can be used) for the duration of the COVID-19 declaration under Section 564(b)(1) of the Act, 21 U.S.C. section 360bbb-3(b)(1), unless the authorization is terminated or revoked.     Resp Syncytial Virus by PCR 11/19/2023 NEGATIVE  NEGATIVE Final   Comment: (NOTE) Fact Sheet for Patients: BloggerCourse.com  Fact Sheet for Healthcare Providers: SeriousBroker.it  This test is not yet approved or cleared by the [place]  FDA and has been authorized for detection and/or diagnosis of SARS-CoV-2 by FDA under an Emergency Use Authorization (EUA). This EUA will remain in effect (meaning this test can be used) for the duration of the COVID-19 declaration under Section 564(b)(1) of the Act, 21 U.S.C. section 360bbb-3(b)(1), unless the authorization is terminated or revoked.  Performed at [facility], [address], [place], [place] [address]   Admission on 11/09/2023,  Discharged on 11/09/2023  Component Date Value Ref Range Status   Color, Urine 11/09/2023 AMBER (A)  YELLOW Final   BIOCHEMICALS MAY BE AFFECTED BY COLOR   APPearance 11/09/2023 CLEAR  CLEAR Final   Specific Gravity, Urine 11/09/2023 1.036 (H)  1.005 - 1.030 Final   pH 11/09/2023 5.0  5.0 - 8.0 Final   Glucose, UA 11/09/2023 NEGATIVE  NEGATIVE mg/dL Final   Hgb urine dipstick 11/09/2023 NEGATIVE  NEGATIVE Final   Bilirubin Urine 11/09/2023 SMALL (A)  NEGATIVE Final   Ketones, ur 11/09/2023 NEGATIVE  NEGATIVE mg/dL Final   Protein, ur 11/91/4782 30 (A)  NEGATIVE mg/dL Final   Nitrite 95/62/1308 NEGATIVE  NEGATIVE Final   Leukocytes,Ua 11/09/2023 NEGATIVE  NEGATIVE Final   RBC / HPF 11/09/2023 0-5  0 - 5 RBC/hpf Final   WBC, UA 11/09/2023 0-5  0 - 5 WBC/hpf Final   Bacteria, UA 11/09/2023 NONE SEEN  NONE SEEN Final   Squamous Epithelial / HPF 11/09/2023 0-5  0 - 5 /HPF Final   Mucus 11/09/2023 PRESENT   Final   Performed at [facility], [address] [place], [place] [address]   Opiates 11/09/2023 POSITIVE (A)  NONE DETECTED Final   Cocaine 11/09/2023 POSITIVE (A)  NONE DETECTED Final   Benzodiazepines 11/09/2023 POSITIVE (A)  NONE DETECTED Final   Amphetamines 11/09/2023 NONE DETECTED  NONE DETECTED Final   Tetrahydrocannabinol 11/09/2023 POSITIVE (A)  NONE DETECTED Final   Barbiturates 11/09/2023 NONE DETECTED  NONE DETECTED Final   Comment: (NOTE) DRUG SCREEN FOR MEDICAL PURPOSES ONLY.  IF CONFIRMATION IS NEEDED FOR ANY PURPOSE, NOTIFY LAB WITHIN 5 DAYS.  LOWEST DETECTABLE LIMITS FOR URINE DRUG SCREEN Drug Class                     Cutoff (ng/mL) Amphetamine and metabolites    1000 Barbiturate and metabolites  200 Benzodiazepine                 200 Opiates and metabolites        300 Cocaine and metabolites        300 THC                            50 Performed at [facility], [address], [place], [place] [address]   There  may be more visits with results that are not included.    Allergies: Codeine  Medications:  Facility Ordered Medications  Medication   [COMPLETED] thiamine (VITAMIN B1) tablet 100 mg   LORazepam (ATIVAN) tablet 1 mg   loperamide (IMODIUM) capsule 2-4 mg   multivitamin with minerals tablet 1 tablet   nicotine polacrilex (NICORETTE) gum 2 mg   ondansetron (ZOFRAN-ODT) disintegrating tablet 4 mg   thiamine (VITAMIN B1) tablet 100 mg   acetaminophen (TYLENOL) tablet 650 mg   alum & mag hydroxide-simeth (MAALOX/MYLANTA) 200-200-20 MG/5ML suspension 30 mL   magnesium hydroxide (MILK OF MAGNESIA) suspension 30 mL   haloperidol (HALDOL) tablet 5 mg   And   diphenhydrAMINE (BENADRYL) capsule 50 mg   haloperidol lactate (HALDOL) injection 5 mg   And   diphenhydrAMINE (BENADRYL) injection 50 mg   And   LORazepam (ATIVAN) injection 2 mg   haloperidol lactate (HALDOL) injection 10 mg   And   diphenhydrAMINE (BENADRYL) injection 50 mg   And   LORazepam (ATIVAN) injection 2 mg   hydrOXYzine (ATARAX) tablet 25 mg   diphenhydrAMINE-zinc acetate (BENADRYL) 2-0.1 % cream   gabapentin (NEURONTIN) capsule 300 mg   nicotine (NICODERM CQ - dosed in mg/24 hours) patch 14 mg   traZODone (DESYREL) tablet 50 mg   [COMPLETED] potassium chloride SA (KLOR-CON M) CR tablet 40 mEq   [START ON 01/15/2024] ferrous sulfate EC tablet 325 mg   PTA Medications  Medication Sig   hydrOXYzine (ATARAX) 25 MG tablet Take 1 tablet (25 mg total) by mouth every 6 (six) hours as needed for anxiety.   QUEtiapine (SEROQUEL) 100 MG tablet Take 1 tablet (100 mg total) by mouth at bedtime. (Patient taking differently: Take 100 mg by mouth at bedtime as needed (for sleep).)   thiamine (VITAMIN B-1) 100 MG tablet Take 1 tablet (100 mg total) by mouth daily. (Patient not taking: Reported on 01/13/2024)   Multiple Vitamin (MULTIVITAMIN WITH MINERALS) TABS tablet Take 1 tablet by mouth daily. (Patient not taking: Reported on  01/13/2024)   nicotine polacrilex (NICORETTE) 2 MG gum Take 1 each (2 mg total) by mouth as needed for smoking cessation. (Patient not taking: Reported on 01/13/2024)   gabapentin (NEURONTIN) 300 MG capsule Take 2 capsules (600 mg total) by mouth 3 (three) times daily. (Patient taking differently: Take 300-600 mg by mouth 2 (two) times daily.)   methylPREDNISolone (MEDROL DOSEPAK) 4 MG TBPK tablet Take as directed on packet (Patient taking differently: Take 4-24 mg by mouth See admin instructions. Take 24 mg by mouth with food on day one, then decrease by 4 mg once daily until finished)   predniSONE (DELTASONE) 20 MG tablet Take 2 tablets (40 mg total) by mouth daily. (Patient not taking: Reported on 01/13/2024)   acetaminophen (TYLENOL) 500 MG tablet Take 1 tablet (500 mg total) by mouth every 6 (six) hours as needed. (Patient taking differently: Take 500 mg by mouth every 6 (six) hours as needed for mild pain (  pain score 1-3) or headache.)   Nicotine (NICODERM CQ TD) Place 1 patch onto the skin daily as needed (for smoking cessation while hospitalized).   traZODone (DESYREL) 50 MG tablet Take 50 mg by mouth at bedtime as needed for sleep.    Long Term Goals: Improvement in symptoms so as ready for discharge  Short Term Goals: Patient will verbalize feelings in meetings with treatment team members., Patient will attend at least of 50% of the groups daily., and Pt will complete the PHQ9 on admission, day 3 and discharge.  Medical Decision Making  Pt is appropriate for treatment at [facility] for detox.  UDS + cocaine, benzodiazepines and THC.    Stimulant Use Disorder Cannabis Use Disorder Nicotine Use Disorder Opiate abuse -COWS with clonidine prn -CIWA with Ativan -MVI/Thiamine -Continue gabapentin 400 mg 3 times daily  Iron Deficiency anemia -ferrous sulfate 325 mg daily -monitor for constipation  Leg Pain -gabapentin 300 mg bid -flexeril 5 mg tid prn  Lab and  imaging reviewed and include:      Ethanol         CBC with Differential         Comprehensive metabolic panel         Lipid panel         Magnesium         TSH         Hemoglobin A1c         POCT Urine Drug Screen - (I-Screen)      Recommendations  Based on my evaluation the patient does not appear to have an emergency medical condition.  Augusta Blizzard, MD 01/14/24  12:29 PM

## 2024-01-14 NOTE — ED Notes (Signed)
 Patient alert & oriented x4. Denies intent to harm self or others when asked. Denies A/VH. Patient reports chronic pain in L leg rating 8/10. No acute distress noted. Support and encouragement provided. Scheduled medications administered with no complications, scheduled NRT held per patient request. Routine safety checks conducted per facility protocol. Encouraged patient to notify staff if any thoughts of harm towards self or others arise. Patient verbalizes understanding and agreement.

## 2024-01-14 NOTE — ED Notes (Signed)
 Patient resting with eyes closed in no apparent acute distress. Respirations even and unlabored. Environment secured. Safety checks in place according to facility policy.

## 2024-01-14 NOTE — ED Notes (Signed)
 Pt Patient observed/assessed at bedside lying in bed. Patient alert and oriented to self and location.  Patient endorses pain of 8/10 from hip pain. Pt was observed itching and  medication was administered to help soothe the itching.He denies A/V/H. He denies having any thoughts/plan of self harm and harm towards others. Fluid and snack offered. Patient states that appetite has been good throughout the day. Verbalizes no further complaints at this time. Will continue to monitor and support.

## 2024-01-14 NOTE — ED Notes (Addendum)
 Pt transferred from [facility] to [facility] to get help with Amphetamine, Cocaine, THC and Alcohol abuse. Admission process completed. Pt is oriented to the unit and provided with meal. Pt currently denies SI/HI/AVH. During skin assessment, rashes where located on pt back, hands, thighs, ankle and chest and pt reports they are itchy. Pt reports he developed the rash whiles in [facility] last two days, NP Ene notified about the rash. Will continue to monitor for safety and provide support.

## 2024-01-14 NOTE — ED Provider Notes (Addendum)
 Facility Based Crisis Admission H&P  Date: 01/14/24 Patient Name: Mark Hammond MRN: [id number] Chief Complaint: hearing voices and I need drug treatment  Diagnoses:  Final diagnoses:  None    HPI: Mark Hammond is a 33 y.o male with a history of schizoaffective disorder and polysubstance use disorder, including alcohol, opioids, methamphetamine, tobacco, cocaine, mushrooms, and cannabis. He initially presented to the [facility] reporting auditory hallucinations and seeking treatment for substance abuse. After evaluation, he was recommended for admission to the [facility]. Patient was seen face-to-face upon arrival at [facility]. The patient reports a recent relapse triggered by significant life stressors. He endorses depressed mood and reports left leg pain, which he rates as 8/10 in intensity. This pain reportedly began on 10/19/2023, and he is currently followed by both orthopedic and neurology services. The patient admits to using substances daily as a means of coping with emotional distress and physical discomfort, although he is unable to quantify his substance use accurately. He reports his last alcoholic drink was two days ago and denies any history of alcohol withdrawal seizures or delirium tremens. According to documentation, "the patient consumed one tall beer and two short beers the day prior to admission, smoked $5 worth of methamphetamine approximately two and a half days ago, and took two hits of marijuana." Pt's blood alcohol level upon arrival was 194, and urine drug screening was positive for opiates, cocaine, and marijuana. He denies current suicidal or homicidal ideation, paranoia, or visual hallucinations but endorses auditory hallucinations described as "muffled sounds," which he associates primarily with substance use.  patient was seen face-to-face, and his chart was reviewed by this NP. He is cooperative, alert and oriented X4. His mood is depressed, with a congruent affect. Thought process was  linear and goal directed. He denied suicidal or homicidal ideation, as well as visual hallucinations and paranoia. However, he endorsed auditory hallucinations, describing them as muffled sounds that tend to occur in the context of active substance use. Insight and judgment are impaired in the context of ongoing substance use. Memory appeared grossly intact, though the patient had difficulty recalling the quantity of substances consumed. His speech was spontaneous and of normal rate and tone. Psychomotor activity was unremarkable.  PHQ 2-9:  Flowsheet Row ED from 01/14/2024 in [facility] ED from 12/08/2023 in [facility] ED from 11/21/2023 in [facility]  Thoughts that you would be better off dead, or of hurting yourself in some way Several days Not at all Not at all  PHQ-9 Total Score 14 0 8       Flowsheet Row ED from 01/14/2024 in [facility] Most recent reading at 01/14/2024  3:38 AM ED from 01/12/2024 in [facility] Emergency Department at [facility] Most recent reading at 01/13/2024 12:33 AM ED from 01/12/2024 in [facility] Emergency Department at [facility] Most recent reading at 01/12/2024 12:41 PM  C-SSRS RISK CATEGORY Moderate Risk Moderate Risk No Risk         Total Time spent with patient: 30 minutes  Musculoskeletal  Strength & Muscle Tone: within normal limits Gait & Station: normal Patient leans: Right  Psychiatric Specialty Exam  Presentation General Appearance:  Casual  Eye Contact: Good  Speech: Clear and Coherent  Speech Volume: Normal  Handedness: Right   Mood and Affect  Mood: Depressed  Affect: Congruent   Thought Process  Thought Processes: Coherent; Goal Directed  Descriptions of Associations:Intact  Orientation:Full (  Time, Place and Person)  Thought Content:WDL  Diagnosis of Schizophrenia or Schizoaffective  disorder in past: Yes  Duration of Psychotic Symptoms: Greater than six months  Hallucinations:Hallucinations: Auditory Description of Auditory Hallucinations: "muffled sounds"  Ideas of Reference:None  Suicidal Thoughts:Suicidal Thoughts: No  Homicidal Thoughts:Homicidal Thoughts: No   Sensorium  Memory: Immediate Good; Recent Good; Remote Good  Judgment: Fair  Insight: Good   Executive Functions  Concentration: Good  Attention Span: Good  Recall: Good  Fund of Knowledge: Good  Language: Good   Psychomotor Activity  Psychomotor Activity: Psychomotor Activity: Normal   Assets  Assets: Communication Skills; Desire for Improvement; Physical Health   Sleep  Sleep: Sleep: Fair Number of Hours of Sleep: 6   Nutritional Assessment (For OBS and FBC admissions only) Has the patient had a weight loss or gain of 10 pounds or more in the last 3 months?: No Has the patient had a decrease in food intake/or appetite?: No Does the patient have dental problems?: No Does the patient have eating habits or behaviors that may be indicators of an eating disorder including binging or inducing vomiting?: No Has the patient recently lost weight without trying?: 0 Has the patient been eating poorly because of a decreased appetite?: 0 Malnutrition Screening Tool Score: 0    Physical Exam Vitals and nursing note reviewed.  Constitutional:      General: He is not in acute distress.    Appearance: He is well-developed.  HENT:     Head: Normocephalic and atraumatic.  Eyes:     Conjunctiva/sclera: Conjunctivae normal.  Cardiovascular:     Rate and Rhythm: Normal rate.  Pulmonary:     Effort: Pulmonary effort is normal.  Abdominal:     Tenderness: There is no abdominal tenderness.  Musculoskeletal:        General: Tenderness (left leg pain) present.     Cervical back: Normal range of motion.  Neurological:     Mental Status: He is alert and oriented to person,  place, and time.  Psychiatric:        Attention and Perception: Attention normal.        Mood and Affect: Mood is depressed.        Speech: Speech normal.        Behavior: Behavior normal. Behavior is cooperative.        Thought Content: Thought content normal.    Review of Systems  Constitutional: Negative.   HENT: Negative.    Eyes: Negative.   Respiratory: Negative.    Cardiovascular: Negative.   Gastrointestinal: Negative.   Genitourinary: Negative.   Musculoskeletal: Negative.   Skin: Negative.   Neurological: Negative.   Endo/Heme/Allergies: Negative.   Psychiatric/Behavioral:  Positive for depression, hallucinations and substance abuse. The patient is nervous/anxious.     Blood pressure 109/75, pulse 95, temperature 98.2 F (36.8 C), temperature source Oral, resp. rate 15, SpO2 97%. There is no height or weight on file to calculate BMI.  Past Psychiatric History: schizoaffective disorder and polysubstance use disorder, ( alcohol, opioids, methamphetamine, tobacco, cocaine, mushrooms, and cannabis)   Is the patient at risk to self? No  Has the patient been a risk to self in the past 6 months? No .    Has the patient been a risk to self within the distant past? Yes   Is the patient a risk to others? No   Has the patient been a risk to others in the past 6 months? No   Has the  patient been a risk to others within the distant past? No   Past Medical History:  Past Medical History:  Diagnosis Date   Alcoholic gastritis    Bipolar 1 disorder (HCC)    Dental abscess    Depression    Drug abuse (HCC)    ETOH abuse    Hallucination    Schizo affective schizophrenia (HCC)     Family History:  Family History  Problem Relation Age of Onset   Stroke Other     Social History:  Social History   Tobacco Use   Smoking status: Every Day    Current packs/day: 1.00    Types: Cigarettes    Passive exposure: Current   Smokeless tobacco: Never  Vaping Use   Vaping  status: Every Day   Substances: THC  Substance Use Topics   Alcohol use: Yes    Comment: 4-5x 40oz beers daily   Drug use: Yes    Frequency: 7.0 times per week    Types: "Crack" cocaine, Heroin, MDMA (Ecstacy), Marijuana, Cocaine    Comment: last cocaine use 2/24     Last Labs:  Admission on 01/12/2024, Discharged on 01/14/2024  Component Date Value Ref Range Status   Sodium 01/12/2024 134 (L)  135 - 145 mmol/L Final   Potassium 01/12/2024 2.9 (L)  3.5 - 5.1 mmol/L Final   Chloride 01/12/2024 96 (L)  98 - 111 mmol/L Final   CO2 01/12/2024 26  22 - 32 mmol/L Final   Glucose, Bld 01/12/2024 171 (H)  70 - 99 mg/dL Final   Glucose reference range applies only to samples taken after fasting for at least 8 hours.   BUN 01/12/2024 8  6 - 20 mg/dL Final   Creatinine, Ser 01/12/2024 0.60 (L)  0.61 - 1.24 mg/dL Final   Calcium 16/07/9603 8.5 (L)  8.9 - 10.3 mg/dL Final   Total Protein 54/06/8118 7.5  6.5 - 8.1 g/dL Final   Albumin 14/78/2956 2.8 (L)  3.5 - 5.0 g/dL Final   AST 21/30/8657 49 (H)  15 - 41 U/L Final   ALT 01/12/2024 48 (H)  0 - 44 U/L Final   Alkaline Phosphatase 01/12/2024 112  38 - 126 U/L Final   Total Bilirubin 01/12/2024 0.5  0.0 - 1.2 mg/dL Final   GFR, Estimated 01/12/2024 >60  >60 mL/min Final   Comment: (NOTE) Calculated using the CKD-EPI Creatinine Equation (2021)    Anion gap 01/12/2024 12  5 - 15 Final   Performed at [facility], [address], [place], [place] [address]   Alcohol, Ethyl (B) 01/12/2024 194 (H)  <10 mg/dL Final   Comment: (NOTE) Lowest detectable limit for serum alcohol is 10 mg/dL.  For medical purposes only. Performed at [facility], [address] [place], [place] [address]    Opiates 01/12/2024 POSITIVE (A)  NONE DETECTED Final   Cocaine 01/12/2024 POSITIVE (A)  NONE DETECTED Final   Benzodiazepines 01/12/2024 NONE DETECTED  NONE DETECTED Final   Amphetamines 01/12/2024 NONE DETECTED  NONE  DETECTED Final   Tetrahydrocannabinol 01/12/2024 POSITIVE (A)  NONE DETECTED Final   Barbiturates 01/12/2024 NONE DETECTED  NONE DETECTED Final   Comment: (NOTE) DRUG SCREEN FOR MEDICAL PURPOSES ONLY.  IF CONFIRMATION IS NEEDED FOR ANY PURPOSE, NOTIFY LAB WITHIN 5 DAYS.  LOWEST DETECTABLE LIMITS FOR URINE DRUG SCREEN Drug Class                     Cutoff (  ng/mL) Amphetamine and metabolites    1000 Barbiturate and metabolites    200 Benzodiazepine                 200 Opiates and metabolites        300 Cocaine and metabolites        300 THC                            50 Performed at [facility], [address] [place], [place] [address]    WBC 01/12/2024 11.3 (H)  4.0 - 10.5 K/uL Final   RBC 01/12/2024 3.99 (L)  4.22 - 5.81 MIL/uL Final   Hemoglobin 01/12/2024 11.7 (L)  13.0 - 17.0 g/dL Final   HCT 36/64/4034 36.1 (L)  39.0 - 52.0 % Final   MCV 01/12/2024 90.5  80.0 - 100.0 fL Final   MCH 01/12/2024 29.3  26.0 - 34.0 pg Final   MCHC 01/12/2024 32.4  30.0 - 36.0 g/dL Final   RDW 74/25/9563 13.6  11.5 - 15.5 % Final   Platelets 01/12/2024 477 (H)  150 - 400 K/uL Final   nRBC 01/12/2024 0.0  0.0 - 0.2 % Final   Neutrophils Relative % 01/12/2024 76  % Final   Neutro Abs 01/12/2024 8.6 (H)  1.7 - 7.7 K/uL Final   Lymphocytes Relative 01/12/2024 16  % Final   Lymphs Abs 01/12/2024 1.8  0.7 - 4.0 K/uL Final   Monocytes Relative 01/12/2024 6  % Final   Monocytes Absolute 01/12/2024 0.7  0.1 - 1.0 K/uL Final   Eosinophils Relative 01/12/2024 1  % Final   Eosinophils Absolute 01/12/2024 0.1  0.0 - 0.5 K/uL Final   Basophils Relative 01/12/2024 1  % Final   Basophils Absolute 01/12/2024 0.1  0.0 - 0.1 K/uL Final   Immature Granulocytes 01/12/2024 0  % Final   Abs Immature Granulocytes 01/12/2024 0.04  0.00 - 0.07 K/uL Final   Performed at [facility], [address], [place], [place] [address]   Sodium 01/13/2024 135  135 - 145 mmol/L Final    Potassium 01/13/2024 3.4 (L)  3.5 - 5.1 mmol/L Final   Chloride 01/13/2024 100  98 - 111 mmol/L Final   CO2 01/13/2024 28  22 - 32 mmol/L Final   Glucose, Bld 01/13/2024 106 (H)  70 - 99 mg/dL Final   Glucose reference range applies only to samples taken after fasting for at least 8 hours.   BUN 01/13/2024 8  6 - 20 mg/dL Final   Creatinine, Ser 01/13/2024 0.34 (L)  0.61 - 1.24 mg/dL Final   Calcium 33/29/5188 7.9 (L)  8.9 - 10.3 mg/dL Final   Total Protein 41/66/0630 7.1  6.5 - 8.1 g/dL Final   Albumin 16/10/930 2.6 (L)  3.5 - 5.0 g/dL Final   AST 35/57/3220 41  15 - 41 U/L Final   ALT 01/13/2024 43  0 - 44 U/L Final   Alkaline Phosphatase 01/13/2024 125  38 - 126 U/L Final   Total Bilirubin 01/13/2024 0.3  0.0 - 1.2 mg/dL Final   GFR, Estimated 01/13/2024 >60  >60 mL/min Final   Comment: (NOTE) Calculated using the CKD-EPI Creatinine Equation (2021)    Anion gap 01/13/2024 7  5 - 15 Final   Performed at [facility], [address], [place], [place] [address]  Admission on 01/05/2024, Discharged on 01/06/2024  Component Date Value Ref Range  Status   Sodium 01/05/2024 131 (L)  135 - 145 mmol/L Final   Potassium 01/05/2024 3.4 (L)  3.5 - 5.1 mmol/L Final   Chloride 01/05/2024 96 (L)  98 - 111 mmol/L Final   CO2 01/05/2024 26  22 - 32 mmol/L Final   Glucose, Bld 01/05/2024 118 (H)  70 - 99 mg/dL Final   Glucose reference range applies only to samples taken after fasting for at least 8 hours.   BUN 01/05/2024 7  6 - 20 mg/dL Final   Creatinine, Ser 01/05/2024 0.51 (L)  0.61 - 1.24 mg/dL Final   Calcium 16/07/9603 8.6 (L)  8.9 - 10.3 mg/dL Final   GFR, Estimated 01/05/2024 >60  >60 mL/min Final   Comment: (NOTE) Calculated using the CKD-EPI Creatinine Equation (2021)    Anion gap 01/05/2024 9  5 - 15 Final   Performed at [facility], [address] [place], [place] [address]   WBC 01/05/2024 8.9  4.0 - 10.5 K/uL Final   RBC 01/05/2024 3.56  (L)  4.22 - 5.81 MIL/uL Final   Hemoglobin 01/05/2024 10.7 (L)  13.0 - 17.0 g/dL Final   HCT 11/91/4782 32.3 (L)  39.0 - 52.0 % Final   MCV 01/05/2024 90.7  80.0 - 100.0 fL Final   MCH 01/05/2024 30.1  26.0 - 34.0 pg Final   MCHC 01/05/2024 33.1  30.0 - 36.0 g/dL Final   RDW 95/62/1308 13.1  11.5 - 15.5 % Final   Platelets 01/05/2024 349  150 - 400 K/uL Final   nRBC 01/05/2024 0.0  0.0 - 0.2 % Final   Performed at [facility], [address], [place], [place] [address]   Color, Urine 01/06/2024 YELLOW  YELLOW Final   APPearance 01/06/2024 CLEAR  CLEAR Final   Specific Gravity, Urine 01/06/2024 1.013  1.005 - 1.030 Final   pH 01/06/2024 5.0  5.0 - 8.0 Final   Glucose, UA 01/06/2024 NEGATIVE  NEGATIVE mg/dL Final   Hgb urine dipstick 01/06/2024 NEGATIVE  NEGATIVE Final   Bilirubin Urine 01/06/2024 NEGATIVE  NEGATIVE Final   Ketones, ur 01/06/2024 NEGATIVE  NEGATIVE mg/dL Final   Protein, ur 69/62/9528 NEGATIVE  NEGATIVE mg/dL Final   Nitrite 41/32/4401 NEGATIVE  NEGATIVE Final   Leukocytes,Ua 01/06/2024 NEGATIVE  NEGATIVE Final   Performed at [facility], [address], [place], [place] [address]   Glucose-Capillary 01/05/2024 113 (H)  70 - 99 mg/dL Final   Glucose reference range applies only to samples taken after fasting for at least 8 hours.   Total CK 01/05/2024 116  49 - 397 U/L Final   Performed at [facility], [address] [place], [place] [address]  Admission on 12/13/2023, Discharged on 12/15/2023  Component Date Value Ref Range Status   WBC 12/13/2023 10.4  4.0 - 10.5 K/uL Final   RBC 12/13/2023 4.17 (L)  4.22 - 5.81 MIL/uL Final   Hemoglobin 12/13/2023 12.8 (L)  13.0 - 17.0 g/dL Final   HCT 03/47/4259 39.2  39.0 - 52.0 % Final   MCV 12/13/2023 94.0  80.0 - 100.0 fL Final   MCH 12/13/2023 30.7  26.0 - 34.0 pg Final   MCHC 12/13/2023 32.7  30.0 - 36.0 g/dL Final   RDW 56/38/7564 14.2  11.5 - 15.5 % Final    Platelets 12/13/2023 360  150 - 400 K/uL Final   nRBC 12/13/2023 0.0  0.0 - 0.2 % Final   Neutrophils Relative % 12/13/2023 62  %  Final   Neutro Abs 12/13/2023 6.4  1.7 - 7.7 K/uL Final   Lymphocytes Relative 12/13/2023 25  % Final   Lymphs Abs 12/13/2023 2.6  0.7 - 4.0 K/uL Final   Monocytes Relative 12/13/2023 11  % Final   Monocytes Absolute 12/13/2023 1.2 (H)  0.1 - 1.0 K/uL Final   Eosinophils Relative 12/13/2023 1  % Final   Eosinophils Absolute 12/13/2023 0.2  0.0 - 0.5 K/uL Final   Basophils Relative 12/13/2023 1  % Final   Basophils Absolute 12/13/2023 0.1  0.0 - 0.1 K/uL Final   Immature Granulocytes 12/13/2023 0  % Final   Abs Immature Granulocytes 12/13/2023 0.04  0.00 - 0.07 K/uL Final   Performed at [facility], [address], [place], [place] [address]   Sodium 12/13/2023 137  135 - 145 mmol/L Final   Potassium 12/13/2023 3.9  3.5 - 5.1 mmol/L Final   Chloride 12/13/2023 104  98 - 111 mmol/L Final   CO2 12/13/2023 23  22 - 32 mmol/L Final   Glucose, Bld 12/13/2023 107 (H)  70 - 99 mg/dL Final   Glucose reference range applies only to samples taken after fasting for at least 8 hours.   BUN 12/13/2023 11  6 - 20 mg/dL Final   Creatinine, Ser 12/13/2023 0.62  0.61 - 1.24 mg/dL Final   Calcium 60/45/4098 8.8 (L)  8.9 - 10.3 mg/dL Final   Total Protein 11/91/4782 8.1  6.5 - 8.1 g/dL Final   Albumin 95/62/1308 2.9 (L)  3.5 - 5.0 g/dL Final   AST 65/78/4696 155 (H)  15 - 41 U/L Final   ALT 12/13/2023 118 (H)  0 - 44 U/L Final   Alkaline Phosphatase 12/13/2023 128 (H)  38 - 126 U/L Final   Total Bilirubin 12/13/2023 0.4  0.0 - 1.2 mg/dL Final   GFR, Estimated 12/13/2023 >60  >60 mL/min Final   Comment: (NOTE) Calculated using the CKD-EPI Creatinine Equation (2021)    Anion gap 12/13/2023 10  5 - 15 Final   Performed at [facility], [address], [place], [place] [address]   Alcohol, Ethyl (B) 12/13/2023 204 (H)  <10 mg/dL Final    Comment: (NOTE) Lowest detectable limit for serum alcohol is 10 mg/dL.  For medical purposes only. Performed at [facility], [address] [place], [place] [address]    Salicylate Lvl 12/13/2023 <7.0 (L)  7.0 - 30.0 mg/dL Final   Performed at [facility], [address] [place], [place] [address]   Acetaminophen (Tylenol), Serum 12/13/2023 11  10 - 30 ug/mL Final   Comment: (NOTE) Therapeutic concentrations vary significantly. A range of 10-30 ug/mL  may be an effective concentration for many patients. However, some  are best treated at concentrations outside of this range. Acetaminophen concentrations >150 ug/mL at 4 hours after ingestion  and >50 ug/mL at 12 hours after ingestion are often associated with  toxic reactions.  Performed at [facility], [address], [place], [place] [address]    Opiates 12/13/2023 POSITIVE (A)  NONE DETECTED Final   Cocaine 12/13/2023 POSITIVE (A)  NONE DETECTED Final   Benzodiazepines 12/13/2023 POSITIVE (A)  NONE DETECTED Final   Amphetamines 12/13/2023 NONE DETECTED  NONE DETECTED Final   Tetrahydrocannabinol 12/13/2023 POSITIVE (A)  NONE DETECTED Final   Barbiturates 12/13/2023 NONE DETECTED  NONE DETECTED Final   Comment: (NOTE) DRUG SCREEN FOR MEDICAL PURPOSES ONLY.  IF CONFIRMATION IS NEEDED  FOR ANY PURPOSE, NOTIFY LAB WITHIN 5 DAYS.  LOWEST DETECTABLE LIMITS FOR URINE DRUG SCREEN Drug Class                     Cutoff (ng/mL) Amphetamine and metabolites    1000 Barbiturate and metabolites    200 Benzodiazepine                 200 Opiates and metabolites        300 Cocaine and metabolites        300 THC                            50 Performed at [facility], [address], [place], [place] [address]   Admission on 12/08/2023, Discharged on 12/12/2023  Component Date Value Ref Range Status   Opiates 12/08/2023 NONE DETECTED  NONE DETECTED Final    Cocaine 12/08/2023 NONE DETECTED  NONE DETECTED Final   Benzodiazepines 12/08/2023 POSITIVE (A)  NONE DETECTED Final   Amphetamines 12/08/2023 NONE DETECTED  NONE DETECTED Final   Tetrahydrocannabinol 12/08/2023 POSITIVE (A)  NONE DETECTED Final   Barbiturates 12/08/2023 NONE DETECTED  NONE DETECTED Final   Comment: (NOTE) DRUG SCREEN FOR MEDICAL PURPOSES ONLY.  IF CONFIRMATION IS NEEDED FOR ANY PURPOSE, NOTIFY LAB WITHIN 5 DAYS.  LOWEST DETECTABLE LIMITS FOR URINE DRUG SCREEN Drug Class                     Cutoff (ng/mL) Amphetamine and metabolites    1000 Barbiturate and metabolites    200 Benzodiazepine                 200 Opiates and metabolites        300 Cocaine and metabolites        300 THC                            50 Performed at [facility], [address], [place], [place] [address]   Admission on 12/06/2023, Discharged on 12/08/2023  Component Date Value Ref Range Status   Alcohol, Ethyl (B) 12/06/2023 <10  <10 mg/dL Final   Comment: (NOTE) Lowest detectable limit for serum alcohol is 10 mg/dL.  For medical purposes only. Performed at [facility], [address] [place], [place] [address]    POC Amphetamine UR 12/06/2023 None Detected  NONE DETECTED (Cut Off Level 1000 ng/mL) Final   POC Secobarbital (BAR) 12/06/2023 None Detected  NONE DETECTED (Cut Off Level 300 ng/mL) Final   POC Buprenorphine (BUP) 12/06/2023 None Detected  NONE DETECTED (Cut Off Level 10 ng/mL) Final   POC Oxazepam (BZO) 12/06/2023 Positive (A)  NONE DETECTED (Cut Off Level 300 ng/mL) Final   POC Cocaine UR 12/06/2023 Positive (A)  NONE DETECTED (Cut Off Level 300 ng/mL) Final   POC Methamphetamine UR 12/06/2023 None Detected  NONE DETECTED (Cut Off Level 1000 ng/mL) Final   POC Morphine 12/06/2023 None Detected  NONE DETECTED (Cut Off Level 300 ng/mL) Final   POC Methadone UR 12/06/2023 None Detected  NONE DETECTED (Cut Off Level 300 ng/mL) Final   POC Oxycodone UR  12/06/2023 None Detected  NONE DETECTED (Cut Off Level 100 ng/mL) Final   POC Marijuana UR 12/06/2023 Positive (A)  NONE DETECTED (Cut Off Level 50 ng/mL) Final   WBC 12/08/2023 4.4  4.0 - 10.5  K/uL Final   RBC 12/08/2023 3.53 (L)  4.22 - 5.81 MIL/uL Final   Hemoglobin 12/08/2023 11.2 (L)  13.0 - 17.0 g/dL Final   HCT 78/29/5621 32.6 (L)  39.0 - 52.0 % Final   MCV 12/08/2023 92.4  80.0 - 100.0 fL Final   MCH 12/08/2023 31.7  26.0 - 34.0 pg Final   MCHC 12/08/2023 34.4  30.0 - 36.0 g/dL Final   RDW 30/86/5784 13.9  11.5 - 15.5 % Final   Platelets 12/08/2023 280  150 - 400 K/uL Final   nRBC 12/08/2023 0.0  0.0 - 0.2 % Final   Neutrophils Relative % 12/08/2023 59  % Final   Neutro Abs 12/08/2023 2.6  1.7 - 7.7 K/uL Final   Lymphocytes Relative 12/08/2023 27  % Final   Lymphs Abs 12/08/2023 1.2  0.7 - 4.0 K/uL Final   Monocytes Relative 12/08/2023 9  % Final   Monocytes Absolute 12/08/2023 0.4  0.1 - 1.0 K/uL Final   Eosinophils Relative 12/08/2023 3  % Final   Eosinophils Absolute 12/08/2023 0.1  0.0 - 0.5 K/uL Final   Basophils Relative 12/08/2023 1  % Final   Basophils Absolute 12/08/2023 0.1  0.0 - 0.1 K/uL Final   Immature Granulocytes 12/08/2023 1  % Final   Abs Immature Granulocytes 12/08/2023 0.02  0.00 - 0.07 K/uL Final   Performed at [facility], [address], [place], [place] [address]   Sodium 12/08/2023 139  135 - 145 mmol/L Final   Potassium 12/08/2023 4.0  3.5 - 5.1 mmol/L Final   Chloride 12/08/2023 100  98 - 111 mmol/L Final   CO2 12/08/2023 27  22 - 32 mmol/L Final   Glucose, Bld 12/08/2023 114 (H)  70 - 99 mg/dL Final   Glucose reference range applies only to samples taken after fasting for at least 8 hours.   BUN 12/08/2023 9  6 - 20 mg/dL Final   Creatinine, Ser 12/08/2023 0.60 (L)  0.61 - 1.24 mg/dL Final   Calcium 52/84/1324 8.7 (L)  8.9 - 10.3 mg/dL Final   Total Protein 40/07/2724 6.2 (L)  6.5 - 8.1 g/dL Final   Albumin 36/64/4034 2.3 (L)  3.5 - 5.0  g/dL Final   AST 74/25/9563 101 (H)  15 - 41 U/L Final   ALT 12/08/2023 55 (H)  0 - 44 U/L Final   Alkaline Phosphatase 12/08/2023 125  38 - 126 U/L Final   Total Bilirubin 12/08/2023 0.4  0.0 - 1.2 mg/dL Final   GFR, Estimated 12/08/2023 >60  >60 mL/min Final   Comment: (NOTE) Calculated using the CKD-EPI Creatinine Equation (2021)    Anion gap 12/08/2023 12  5 - 15 Final   Performed at [facility], [address] [place], [place] [address]   Cholesterol 12/08/2023 96  0 - 200 mg/dL Final   Triglycerides 33/29/5188 55  <150 mg/dL Final   HDL 41/66/0630 34 (L)  >40 mg/dL Final   Total CHOL/HDL Ratio 12/08/2023 2.8  RATIO Final   VLDL 12/08/2023 11  0 - 40 mg/dL Final   LDL Cholesterol 12/08/2023 51  0 - 99 mg/dL Final   Comment:        Total Cholesterol/HDL:CHD Risk Coronary Heart Disease Risk Table                     Men   Women  1/2 Average Risk   3.4   3.3  Average Risk  5.0   4.4  2 X Average Risk   9.6   7.1  3 X Average Risk  23.4   11.0        Use the calculated Patient Ratio above and the CHD Risk Table to determine the patient's CHD Risk.        ATP III CLASSIFICATION (LDL):  <100     mg/dL   Optimal  161-096  mg/dL   Near or Above                    Optimal  130-159  mg/dL   Borderline  045-409  mg/dL   High  >811     mg/dL   Very High Performed at [facility], [address] [place], [place] [address]    Magnesium 12/08/2023 1.7  1.7 - 2.4 mg/dL Final   Performed at [facility], [address] [place], [place] [address]   TSH 12/08/2023 0.714  0.350 - 4.500 uIU/mL Final   Comment: Performed by a 3rd Generation assay with a functional sensitivity of <=0.01 uIU/mL. Performed at [facility], [address] [place], [place] [address]    Hgb A1c MFr Bld 12/08/2023 6.0 (H)  4.8 - 5.6 % Final   Comment: (NOTE) Pre diabetes:          5.7%-6.4%  Diabetes:              >6.4%  Glycemic control for   <7.0% adults with diabetes     Mean Plasma Glucose 12/08/2023 125.5  mg/dL Final   Performed at [facility], [address] [place], [place] [address]  Admission on 12/04/2023, Discharged on 12/04/2023  Component Date Value Ref Range Status   Color, Urine 12/04/2023 YELLOW  YELLOW Final   APPearance 12/04/2023 CLEAR  CLEAR Final   Specific Gravity, Urine 12/04/2023 1.029  1.005 - 1.030 Final   pH 12/04/2023 5.0  5.0 - 8.0 Final   Glucose, UA 12/04/2023 NEGATIVE  NEGATIVE mg/dL Final   Hgb urine dipstick 12/04/2023 NEGATIVE  NEGATIVE Final   Bilirubin Urine 12/04/2023 NEGATIVE  NEGATIVE Final   Ketones, ur 12/04/2023 NEGATIVE  NEGATIVE mg/dL Final   Protein, ur 96/29/5284 NEGATIVE  NEGATIVE mg/dL Final   Nitrite 13/24/4010 NEGATIVE  NEGATIVE Final   Leukocytes,Ua 12/04/2023 NEGATIVE  NEGATIVE Final   Performed at [facility], [address] [place], [place] [address]  Admission on 11/27/2023, Discharged on 11/28/2023  Component Date Value Ref Range Status   WBC 11/27/2023 13.5 (H)  4.0 - 10.5 K/uL Final   RBC 11/27/2023 4.12 (L)  4.22 - 5.81 MIL/uL Final   Hemoglobin 11/27/2023 12.8 (L)  13.0 - 17.0 g/dL Final   HCT 66/44/0347 38.4 (L)  39.0 - 52.0 % Final   MCV 11/27/2023 93.2  80.0 - 100.0 fL Final   MCH 11/27/2023 31.1  26.0 - 34.0 pg Final   MCHC 11/27/2023 33.3  30.0 - 36.0 g/dL Final   RDW 42/59/5638 13.4  11.5 - 15.5 % Final   Platelets 11/27/2023 375  150 - 400 K/uL Final   nRBC 11/27/2023 0.0  0.0 - 0.2 % Final   Performed at [facility], [address] [place], [place] [address]   Sodium 11/27/2023 132 (L)  135 - 145 mmol/L Final   Potassium 11/27/2023 4.3  3.5 - 5.1 mmol/L Final   Chloride 11/27/2023 95 (L)  98 - 111 mmol/L Final  CO2 11/27/2023 21 (L)  22 - 32 mmol/L Final   Glucose, Bld 11/27/2023 113 (H)  70 - 99 mg/dL Final   Glucose reference range applies only to samples taken after fasting for at least 8 hours.   BUN 11/27/2023 15  6 - 20 mg/dL Final    Creatinine, Ser 11/27/2023 0.86  0.61 - 1.24 mg/dL Final   Calcium 40/98/1191 9.5  8.9 - 10.3 mg/dL Final   GFR, Estimated 11/27/2023 >60  >60 mL/min Final   Comment: (NOTE) Calculated using the CKD-EPI Creatinine Equation (2021)    Anion gap 11/27/2023 16 (H)  5 - 15 Final   Performed at [facility], [address], [place], [place] [address]   Salicylate Lvl 11/27/2023 <7.0 (L)  7.0 - 30.0 mg/dL Final   Performed at [facility], [address] [place], [place] [address]   Acetaminophen (Tylenol), Serum 11/27/2023 10  10 - 30 ug/mL Final   Comment: (NOTE) Therapeutic concentrations vary significantly. A range of 10-30 ug/mL  may be an effective concentration for many patients. However, some  are best treated at concentrations outside of this range. Acetaminophen concentrations >150 ug/mL at 4 hours after ingestion  and >50 ug/mL at 12 hours after ingestion are often associated with  toxic reactions.  Performed at [facility], [address] [place], [place] [address]    Alcohol, Ethyl (B) 11/27/2023 127 (H)  <10 mg/dL Final   Comment: (NOTE) Lowest detectable limit for serum alcohol is 10 mg/dL.  For medical purposes only. Performed at [facility], [address], [place], [place] [address]    Opiates 11/27/2023 POSITIVE (A)  NONE DETECTED Final   Cocaine 11/27/2023 POSITIVE (A)  NONE DETECTED Final   Benzodiazepines 11/27/2023 POSITIVE (A)  NONE DETECTED Final   Amphetamines 11/27/2023 NONE DETECTED  NONE DETECTED Final   Tetrahydrocannabinol 11/27/2023 POSITIVE (A)  NONE DETECTED Final   Barbiturates 11/27/2023 NONE DETECTED  NONE DETECTED Final   Comment: (NOTE) DRUG SCREEN FOR MEDICAL PURPOSES ONLY.  IF CONFIRMATION IS NEEDED FOR ANY PURPOSE, NOTIFY LAB WITHIN 5 DAYS.  LOWEST DETECTABLE LIMITS FOR URINE DRUG SCREEN Drug Class                     Cutoff (ng/mL) Amphetamine and metabolites    1000 Barbiturate and metabolites     200 Benzodiazepine                 200 Opiates and metabolites        300 Cocaine and metabolites        300 THC                            50 Performed at [facility], [address], [place], [place] [address]    Color, Urine 11/27/2023 AMBER (A)  YELLOW Final   BIOCHEMICALS MAY BE AFFECTED BY COLOR   APPearance 11/27/2023 CLEAR  CLEAR Final   Specific Gravity, Urine 11/27/2023 1.020  1.005 - 1.030 Final   pH 11/27/2023 5.0  5.0 - 8.0 Final   Glucose, UA 11/27/2023 NEGATIVE  NEGATIVE mg/dL Final   Hgb urine dipstick 11/27/2023 NEGATIVE  NEGATIVE Final   Bilirubin Urine 11/27/2023 NEGATIVE  NEGATIVE Final   Ketones, ur 11/27/2023 NEGATIVE  NEGATIVE mg/dL Final   Protein, ur 41/32/4401 NEGATIVE  NEGATIVE mg/dL Final   Nitrite 02/72/5366 NEGATIVE  NEGATIVE  Final   Leukocytes,Ua 11/27/2023 NEGATIVE  NEGATIVE Final   Performed at [facility], [address] [place], [place] [address]   Neisseria Gonorrhea 11/27/2023 Negative   Final   Chlamydia 11/27/2023 Negative   Final   Comment 11/27/2023 Normal Reference Ranger Chlamydia - Negative   Final   Comment 11/27/2023 Normal Reference Range Neisseria Gonorrhea - Negative   Final  Admission on 11/21/2023, Discharged on 11/27/2023  Component Date Value Ref Range Status   SARS Coronavirus 2 by RT PCR 11/25/2023 NEGATIVE  NEGATIVE Final   Performed at [facility], [address] [place], [place] [address]   WBC 11/21/2023 5.6  4.0 - 10.5 K/uL Final   RBC 11/21/2023 4.14 (L)  4.22 - 5.81 MIL/uL Final   Hemoglobin 11/21/2023 13.1  13.0 - 17.0 g/dL Final   HCT 27/25/3664 38.8 (L)  39.0 - 52.0 % Final   MCV 11/21/2023 93.7  80.0 - 100.0 fL Final   MCH 11/21/2023 31.6  26.0 - 34.0 pg Final   MCHC 11/21/2023 33.8  30.0 - 36.0 g/dL Final   RDW 40/34/7425 13.2  11.5 - 15.5 % Final   Platelets 11/21/2023 298  150 - 400 K/uL Final   nRBC 11/21/2023 0.0  0.0 - 0.2 % Final   Neutrophils Relative % 11/21/2023 83  % Final    Neutro Abs 11/21/2023 4.6  1.7 - 7.7 K/uL Final   Lymphocytes Relative 11/21/2023 14  % Final   Lymphs Abs 11/21/2023 0.8  0.7 - 4.0 K/uL Final   Monocytes Relative 11/21/2023 3  % Final   Monocytes Absolute 11/21/2023 0.2  0.1 - 1.0 K/uL Final   Eosinophils Relative 11/21/2023 0  % Final   Eosinophils Absolute 11/21/2023 0.0  0.0 - 0.5 K/uL Final   Basophils Relative 11/21/2023 0  % Final   Basophils Absolute 11/21/2023 0.0  0.0 - 0.1 K/uL Final   Immature Granulocytes 11/21/2023 0  % Final   Abs Immature Granulocytes 11/21/2023 0.01  0.00 - 0.07 K/uL Final   Performed at [facility], [address], [place], [place] [address]   Sodium 11/21/2023 135  135 - 145 mmol/L Final   Potassium 11/21/2023 4.2  3.5 - 5.1 mmol/L Final   Chloride 11/21/2023 99  98 - 111 mmol/L Final   CO2 11/21/2023 26  22 - 32 mmol/L Final   Glucose, Bld 11/21/2023 162 (H)  70 - 99 mg/dL Final   Glucose reference range applies only to samples taken after fasting for at least 8 hours.   BUN 11/21/2023 7  6 - 20 mg/dL Final   Creatinine, Ser 11/21/2023 0.63  0.61 - 1.24 mg/dL Final   Calcium 75/64/3329 8.9  8.9 - 10.3 mg/dL Final   Total Protein 51/88/4166 8.1  6.5 - 8.1 g/dL Final   Albumin 04/01/1600 2.8 (L)  3.5 - 5.0 g/dL Final   AST 09/32/3557 91 (H)  15 - 41 U/L Final   ALT 11/21/2023 55 (H)  0 - 44 U/L Final   Alkaline Phosphatase 11/21/2023 87  38 - 126 U/L Final   Total Bilirubin 11/21/2023 0.8  0.0 - 1.2 mg/dL Final   GFR, Estimated 11/21/2023 >60  >60 mL/min Final   Comment: (NOTE) Calculated using the CKD-EPI Creatinine Equation (2021)    Anion gap 11/21/2023 10  5 - 15 Final   Performed at [facility], [address], [place], [place] [address]   Alcohol, Ethyl (B) 11/21/2023 <  10  <10 mg/dL Final   Comment: (NOTE) Lowest detectable limit for serum alcohol is 10 mg/dL.  For medical purposes only. Performed at [facility], [address] [place], [place] [address]    TSH  11/21/2023 0.365  0.350 - 4.500 uIU/mL Final   Comment: Performed by a 3rd Generation assay with a functional sensitivity of <=0.01 uIU/mL. Performed at [facility], [address], [place], [place] [address]    POC Amphetamine UR 11/22/2023 None Detected  NONE DETECTED (Cut Off Level 1000 ng/mL) Final   POC Secobarbital (BAR) 11/22/2023 None Detected  NONE DETECTED (Cut Off Level 300 ng/mL) Final   POC Buprenorphine (BUP) 11/22/2023 None Detected  NONE DETECTED (Cut Off Level 10 ng/mL) Final   POC Oxazepam (BZO) 11/22/2023 Positive (A)  NONE DETECTED (Cut Off Level 300 ng/mL) Final   POC Cocaine UR 11/22/2023 Positive (A)  NONE DETECTED (Cut Off Level 300 ng/mL) Final   POC Methamphetamine UR 11/22/2023 None Detected  NONE DETECTED (Cut Off Level 1000 ng/mL) Final   POC Morphine 11/22/2023 None Detected  NONE DETECTED (Cut Off Level 300 ng/mL) Final   POC Methadone UR 11/22/2023 None Detected  NONE DETECTED (Cut Off Level 300 ng/mL) Final   POC Oxycodone UR 11/22/2023 None Detected  NONE DETECTED (Cut Off Level 100 ng/mL) Final   POC Marijuana UR 11/22/2023 Positive (A)  NONE DETECTED (Cut Off Level 50 ng/mL) Final   Hgb A1c MFr Bld 11/25/2023 5.9 (H)  4.8 - 5.6 % Final   Comment: (NOTE) Pre diabetes:          5.7%-6.4%  Diabetes:              >6.4%  Glycemic control for   <7.0% adults with diabetes    Mean Plasma Glucose 11/25/2023 122.63  mg/dL Final   Performed at [facility], [address], [place], [place] [address]   Sodium 11/25/2023 136  135 - 145 mmol/L Final   Potassium 11/25/2023 4.2  3.5 - 5.1 mmol/L Final   Chloride 11/25/2023 101  98 - 111 mmol/L Final   CO2 11/25/2023 27  22 - 32 mmol/L Final   Glucose, Bld 11/25/2023 108 (H)  70 - 99 mg/dL Final   Glucose reference range applies only to samples taken after fasting for at least 8 hours.   BUN 11/25/2023 9  6 - 20 mg/dL Final   Creatinine, Ser 11/25/2023 0.68  0.61 - 1.24 mg/dL Final   Calcium 91/47/8295  8.6 (L)  8.9 - 10.3 mg/dL Final   Total Protein 62/13/0865 7.0  6.5 - 8.1 g/dL Final   Albumin 78/46/9629 2.3 (L)  3.5 - 5.0 g/dL Final   AST 52/84/1324 81 (H)  15 - 41 U/L Final   ALT 11/25/2023 58 (H)  0 - 44 U/L Final   Alkaline Phosphatase 11/25/2023 84  38 - 126 U/L Final   Total Bilirubin 11/25/2023 0.7  0.0 - 1.2 mg/dL Final   GFR, Estimated 11/25/2023 >60  >60 mL/min Final   Comment: (NOTE) Calculated using the CKD-EPI Creatinine Equation (2021)    Anion gap 11/25/2023 8  5 - 15 Final   Performed at [facility], [address] [place], [place] [address]   Iron 11/25/2023 21 (L)  45 - 182 ug/dL Final   TIBC 72/53/6644 280  250 - 450 ug/dL Final   Saturation Ratios 11/25/2023 8 (L)  17.9 - 39.5 % Final   UIBC  11/25/2023 259  ug/dL Final   Performed at [facility], [address] [place], [place] [address]   Vitamin B-12 11/25/2023 549  180 - 914 pg/mL Final   Comment: (NOTE) This assay is not validated for testing neonatal or myeloproliferative syndrome specimens for Vitamin B12 levels. Performed at [facility], [address] [place], [place] [address]    Ferritin 11/25/2023 217  24 - 336 ng/mL Final   Performed at [facility], [address], [place], [place] [address]  Admission on 11/19/2023, Discharged on 11/20/2023  Component Date Value Ref Range Status   Sodium 11/19/2023 138  135 - 145 mmol/L Final   Potassium 11/19/2023 3.4 (L)  3.5 - 5.1 mmol/L Final   Chloride 11/19/2023 103  98 - 111 mmol/L Final   CO2 11/19/2023 26  22 - 32 mmol/L Final   Glucose, Bld 11/19/2023 123 (H)  70 - 99 mg/dL Final   Glucose reference range applies only to samples taken after fasting for at least 8 hours.   BUN 11/19/2023 <5 (L)  6 - 20 mg/dL Final   Creatinine, Ser 11/19/2023 0.50 (L)  0.61 - 1.24 mg/dL Final   Calcium 95/63/8756 8.2 (L)  8.9 - 10.3 mg/dL Final   GFR, Estimated 11/19/2023 >60  >60 mL/min Final   Comment: (NOTE) Calculated using the  CKD-EPI Creatinine Equation (2021)    Anion gap 11/19/2023 9  5 - 15 Final   Performed at [facility], [address] [place], [place] [address]   WBC 11/19/2023 8.4  4.0 - 10.5 K/uL Final   RBC 11/19/2023 4.00 (L)  4.22 - 5.81 MIL/uL Final   Hemoglobin 11/19/2023 12.6 (L)  13.0 - 17.0 g/dL Final   HCT 51/88/4166 37.7 (L)  39.0 - 52.0 % Final   MCV 11/19/2023 94.3  80.0 - 100.0 fL Final   MCH 11/19/2023 31.5  26.0 - 34.0 pg Final   MCHC 11/19/2023 33.4  30.0 - 36.0 g/dL Final   RDW 04/01/1600 13.2  11.5 - 15.5 % Final   Platelets 11/19/2023 267  150 - 400 K/uL Final   nRBC 11/19/2023 0.0  0.0 - 0.2 % Final   Neutrophils Relative % 11/19/2023 65  % Final   Neutro Abs 11/19/2023 5.5  1.7 - 7.7 K/uL Final   Lymphocytes Relative 11/19/2023 22  % Final   Lymphs Abs 11/19/2023 1.9  0.7 - 4.0 K/uL Final   Monocytes Relative 11/19/2023 11  % Final   Monocytes Absolute 11/19/2023 0.9  0.1 - 1.0 K/uL Final   Eosinophils Relative 11/19/2023 1  % Final   Eosinophils Absolute 11/19/2023 0.1  0.0 - 0.5 K/uL Final   Basophils Relative 11/19/2023 1  % Final   Basophils Absolute 11/19/2023 0.1  0.0 - 0.1 K/uL Final   Immature Granulocytes 11/19/2023 0  % Final   Abs Immature Granulocytes 11/19/2023 0.03  0.00 - 0.07 K/uL Final   Performed at [facility], [address], [place], [place] [address]   Sed Rate 11/19/2023 77 (H)  0 - 16 mm/hr Final   Performed at [facility], [address] [place], [place] [address]   CRP 11/19/2023 4.2 (H)  <1.0 mg/dL Final   Performed at [facility], [address], [place], [place] [address]   Sodium 11/19/2023 141  135 - 145 mmol/L Final   Potassium 11/19/2023 3.5  3.5 - 5.1 mmol/L Final  Chloride 11/19/2023 101  98 - 111 mmol/L Final   BUN 11/19/2023 <3 (L)  6 - 20 mg/dL Final   Creatinine, Ser 11/19/2023 0.70  0.61 - 1.24 mg/dL Final   Glucose, Bld 60/45/4098 126 (H)  70 - 99 mg/dL Final    Glucose reference range applies only to samples taken after fasting for at least 8 hours.   Calcium, Ion 11/19/2023 1.05 (L)  1.15 - 1.40 mmol/L Final   TCO2 11/19/2023 27  22 - 32 mmol/L Final   Hemoglobin 11/19/2023 12.9 (L)  13.0 - 17.0 g/dL Final   HCT 11/91/4782 38.0 (L)  39.0 - 52.0 % Final   SARS Coronavirus 2 by RT PCR 11/19/2023 NEGATIVE  NEGATIVE Final   Comment: (NOTE) SARS-CoV-2 target nucleic acids are NOT DETECTED.  The SARS-CoV-2 RNA is generally detectable in upper respiratory specimens during the acute phase of infection. The lowest concentration of SARS-CoV-2 viral copies this assay can detect is 138 copies/mL. A negative result does not preclude SARS-Cov-2 infection and should not be used as the sole basis for treatment or other patient management decisions. A negative result may occur with  improper specimen collection/handling, submission of specimen other than nasopharyngeal swab, presence of viral mutation(s) within the areas targeted by this assay, and inadequate number of viral copies(<138 copies/mL). A negative result must be combined with clinical observations, patient history, and epidemiological information. The expected result is Negative.  Fact Sheet for Patients:  BloggerCourse.com  Fact Sheet for Healthcare Providers:  SeriousBroker.it  This test is no                          t yet approved or cleared by the [place]  FDA and  has been authorized for detection and/or diagnosis of SARS-CoV-2 by FDA under an Emergency Use Authorization (EUA). This EUA will remain  in effect (meaning this test can be used) for the duration of the COVID-19 declaration under Section 564(b)(1) of the Act, 21 U.S.C.section 360bbb-3(b)(1), unless the authorization is terminated  or revoked sooner.       Influenza A by PCR 11/19/2023 NEGATIVE  NEGATIVE Final   Influenza B by PCR 11/19/2023 NEGATIVE  NEGATIVE Final    Comment: (NOTE) The Xpert Xpress SARS-CoV-2/FLU/RSV plus assay is intended as an aid in the diagnosis of influenza from Nasopharyngeal swab specimens and should not be used as a sole basis for treatment. Nasal washings and aspirates are unacceptable for Xpert Xpress SARS-CoV-2/FLU/RSV testing.  Fact Sheet for Patients: BloggerCourse.com  Fact Sheet for Healthcare Providers: SeriousBroker.it  This test is not yet approved or cleared by the [place]  FDA and has been authorized for detection and/or diagnosis of SARS-CoV-2 by FDA under an Emergency Use Authorization (EUA). This EUA will remain in effect (meaning this test can be used) for the duration of the COVID-19 declaration under Section 564(b)(1) of the Act, 21 U.S.C. section 360bbb-3(b)(1), unless the authorization is terminated or revoked.     Resp Syncytial Virus by PCR 11/19/2023 NEGATIVE  NEGATIVE Final   Comment: (NOTE) Fact Sheet for Patients: BloggerCourse.com  Fact Sheet for Healthcare Providers: SeriousBroker.it  This test is not yet approved or cleared by the [place]  FDA and has been authorized for detection and/or diagnosis of SARS-CoV-2 by FDA under an Emergency Use Authorization (EUA). This EUA will remain in effect (meaning this test can be used) for the duration of the COVID-19 declaration under Section 564(b)(1) of the Act, 21 U.S.C.  section 360bbb-3(b)(1), unless the authorization is terminated or revoked.  Performed at [facility], [address] [place] [address], [place], [place] [address]   Admission on 11/09/2023, Discharged on 11/09/2023  Component Date Value Ref Range Status   Color, Urine 11/09/2023 AMBER (A)  YELLOW Final   BIOCHEMICALS MAY BE AFFECTED BY COLOR   APPearance 11/09/2023 CLEAR  CLEAR Final   Specific Gravity, Urine 11/09/2023 1.036 (H)  1.005 - 1.030 Final   pH  11/09/2023 5.0  5.0 - 8.0 Final   Glucose, UA 11/09/2023 NEGATIVE  NEGATIVE mg/dL Final   Hgb urine dipstick 11/09/2023 NEGATIVE  NEGATIVE Final   Bilirubin Urine 11/09/2023 SMALL (A)  NEGATIVE Final   Ketones, ur 11/09/2023 NEGATIVE  NEGATIVE mg/dL Final   Protein, ur 69/62/9528 30 (A)  NEGATIVE mg/dL Final   Nitrite 41/32/4401 NEGATIVE  NEGATIVE Final   Leukocytes,Ua 11/09/2023 NEGATIVE  NEGATIVE Final   RBC / HPF 11/09/2023 0-5  0 - 5 RBC/hpf Final   WBC, UA 11/09/2023 0-5  0 - 5 WBC/hpf Final   Bacteria, UA 11/09/2023 NONE SEEN  NONE SEEN Final   Squamous Epithelial / HPF 11/09/2023 0-5  0 - 5 /HPF Final   Mucus 11/09/2023 PRESENT   Final   Performed at [facility], [address], [place], [place] [address]   Opiates 11/09/2023 POSITIVE (A)  NONE DETECTED Final   Cocaine 11/09/2023 POSITIVE (A)  NONE DETECTED Final   Benzodiazepines 11/09/2023 POSITIVE (A)  NONE DETECTED Final   Amphetamines 11/09/2023 NONE DETECTED  NONE DETECTED Final   Tetrahydrocannabinol 11/09/2023 POSITIVE (A)  NONE DETECTED Final   Barbiturates 11/09/2023 NONE DETECTED  NONE DETECTED Final   Comment: (NOTE) DRUG SCREEN FOR MEDICAL PURPOSES ONLY.  IF CONFIRMATION IS NEEDED FOR ANY PURPOSE, NOTIFY LAB WITHIN 5 DAYS.  LOWEST DETECTABLE LIMITS FOR URINE DRUG SCREEN Drug Class                     Cutoff (ng/mL) Amphetamine and metabolites    1000 Barbiturate and metabolites    200 Benzodiazepine                 200 Opiates and metabolites        300 Cocaine and metabolites        300 THC                            50 Performed at [facility], [address], [place], [place] [address]   There may be more visits with results that are not included.    Allergies: Codeine  Medications:  Facility Ordered Medications  Medication   [COMPLETED] thiamine (VITAMIN B1) tablet 100 mg   PTA Medications  Medication Sig   hydrOXYzine (ATARAX) 25 MG tablet Take 1 tablet  (25 mg total) by mouth every 6 (six) hours as needed for anxiety.   QUEtiapine (SEROQUEL) 100 MG tablet Take 1 tablet (100 mg total) by mouth at bedtime. (Patient taking differently: Take 100 mg by mouth at bedtime as needed (for sleep).)   thiamine (VITAMIN B-1) 100 MG tablet Take 1 tablet (100 mg total) by mouth daily. (Patient not taking: Reported on 01/13/2024)   Multiple Vitamin (MULTIVITAMIN WITH MINERALS) TABS tablet Take 1 tablet by mouth daily. (Patient not taking: Reported on 01/13/2024)   nicotine polacrilex (NICORETTE) 2 MG gum Take 1 each (2 mg total) by mouth as needed for smoking  cessation. (Patient not taking: Reported on 01/13/2024)   gabapentin (NEURONTIN) 300 MG capsule Take 2 capsules (600 mg total) by mouth 3 (three) times daily. (Patient taking differently: Take 300-600 mg by mouth 2 (two) times daily.)   methylPREDNISolone (MEDROL DOSEPAK) 4 MG TBPK tablet Take as directed on packet (Patient taking differently: Take 4-24 mg by mouth See admin instructions. Take 24 mg by mouth with food on day one, then decrease by 4 mg once daily until finished)   predniSONE (DELTASONE) 20 MG tablet Take 2 tablets (40 mg total) by mouth daily. (Patient not taking: Reported on 01/13/2024)   acetaminophen (TYLENOL) 500 MG tablet Take 1 tablet (500 mg total) by mouth every 6 (six) hours as needed. (Patient taking differently: Take 500 mg by mouth every 6 (six) hours as needed for mild pain (pain score 1-3) or headache.)   Nicotine (NICODERM CQ TD) Place 1 patch onto the skin daily as needed (for smoking cessation while hospitalized).   traZODone (DESYREL) 50 MG tablet Take 50 mg by mouth at bedtime as needed for sleep.    Long Term Goals: Improvement in symptoms so as ready for discharge  Short Term Goals: Patient will verbalize feelings in meetings with treatment team members., Patient will attend at least of 50% of the groups daily., Pt will complete the PHQ9 on admission, day 3 and discharge.,  Patient will participate in completing the [place] Suicide Severity Rating Scale, Patient will score a low risk of violence for 24 hours prior to discharge, and Patient will take medications as prescribed daily.  Medical Decision Making  Patient will be admitted to [facility] for stabilization.  -Ciwa protocol ordered  Initiate CIWA protocol -lorazepam 1 mg every 6 hours prn for CIWA >10 -thiamine 100 mg daily for nutritional supplementation -hydroxyzine 25 mg every 6 hours prn for anxiety, CIWA < or = 10 -ondansetron 4 mg ODT every 6 hours prn nausea/vomiting -loperamide 2-4 mg capsule prn diarrhea or loose stools  .    Recommendations  Based on my evaluation the patient does not appear to have an emergency medical condition.  Doreatha Gamer, NP 01/14/24  3:45 AM

## 2024-01-14 NOTE — Group Note (Signed)
 Group Topic: Decisional Balance/Substance Abuse  Group Date: 01/14/2024 Start Time: 1930 End Time: 2000 Facilitators: Renetta Carter, NT  Department: [facility]  Number of Participants: 5  Group Focus: coping skills Treatment Modality:  Skills Training Interventions utilized were group exercise Purpose: express feelings  Name: Mark Hammond Date of Birth: 1991/07/03  MR: [id number]    Level of Participation: pt did not attend group Quality of Participation: pt did not attend group Interactions with others: pt did not attend group Mood/Affect: pt did not attend group Triggers (if applicable): pt did not attend group Cognition: pt did not attend group Progress:pt did not attend group  Response: pt did not attend group Plan: pt did not attend group  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   Polysubstance dependence (HCC) 12/13/2023   Polysubstance abuse (HCC) 11/21/2023   Alcohol use disorder 08/04/2023   Stimulant use disorder 08/04/2023   Opioid use disorder 08/04/2023   Tobacco use disorder 08/04/2023   Cannabis use disorder 08/04/2023   Hepatitis C 03/01/2023   Anxiety state 01/14/2023   Insomnia 01/14/2023   Schizoaffective disorder (HCC) 01/13/2023   Schizophrenia (HCC) 11/15/2019   Polysubstance (including opioids) dependence, daily use (HCC)    Substance induced mood disorder (HCC) 10/24/2019   Methamphetamine use disorder, 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

## 2024-01-14 NOTE — ED Notes (Signed)
 Patient is sleeping. Respirations equal and unlabored, skin warm and dry. No change in assessment or acuity. Routine safety checks conducted according to facility protocol. Will continue to monitor for safety.

## 2024-01-14 NOTE — Group Note (Signed)
 Group Topic: Wellness  Group Date: 01/14/2024 Start Time: 1000 End Time: 1030 Facilitators: Dee Farber D, NT  Department: [facility]  Number of Participants: 10  Group Focus: relapse prevention, relaxation, self-awareness, social skills, and substance abuse education Treatment Modality:  Psychoeducation Interventions utilized were support Purpose: enhance coping skills, increase insight, reinforce self-care, and relapse prevention strategies  Name: Mark Hammond Date of Birth: 22-Oct-1990  MR: [id number]    Level of Participation: moderate Quality of Participation: cooperative Interactions with others: gave feedback Mood/Affect: appropriate Triggers (if applicable): N/A Cognition: concrete Progress: Significant Response: N/A Plan: follow-up needed  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   Polysubstance dependence (HCC) 12/13/2023   Polysubstance abuse (HCC) 11/21/2023   Alcohol use disorder 08/04/2023   Stimulant use disorder 08/04/2023   Opioid use disorder 08/04/2023   Tobacco use disorder 08/04/2023   Cannabis use disorder 08/04/2023   Hepatitis C 03/01/2023   Anxiety state 01/14/2023   Insomnia 01/14/2023   Schizoaffective disorder (HCC) 01/13/2023   Schizophrenia (HCC) 11/15/2019   Polysubstance (including opioids) dependence, daily use (HCC)    Substance induced mood disorder (HCC) 10/24/2019   Methamphetamine use disorder, 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

## 2024-01-15 DIAGNOSIS — F1010 Alcohol abuse, uncomplicated: Secondary | ICD-10-CM | POA: Diagnosis not present

## 2024-01-15 DIAGNOSIS — F1110 Opioid abuse, uncomplicated: Secondary | ICD-10-CM | POA: Diagnosis not present

## 2024-01-15 DIAGNOSIS — E876 Hypokalemia: Secondary | ICD-10-CM | POA: Diagnosis not present

## 2024-01-15 MED ORDER — CYCLOBENZAPRINE HCL 10 MG PO TABS
10.0000 mg | ORAL_TABLET | Freq: Three times a day (TID) | ORAL | Status: DC | PRN
  Administered 2024-01-16 – 2024-01-17 (×3): 10 mg via ORAL
  Filled 2024-01-15 (×3): qty 1

## 2024-01-15 NOTE — ED Notes (Signed)
 Patient is sleeping. Respirations equal and unlabored, skin warm and dry. No change in assessment or acuity. Routine safety checks conducted according to facility protocol. Will continue to monitor for safety.

## 2024-01-15 NOTE — Care Management (Signed)
 [facility] Care Management   Writer referred patient to the CD-IOP program.

## 2024-01-15 NOTE — Group Note (Signed)
 Group Topic: Social Support  Group Date: 01/15/2024 Start Time: 2015 End Time: 2030 Facilitators: Soila Dunnings  Department: [facility]  Number of Participants: 4  Group Focus: acceptance and coping skills Treatment Modality:  Leisure Development Interventions utilized were leisure development Purpose: enhance coping skills and relapse prevention strategies  Name: Mark Hammond Date of Birth: 1991-07-10  MR: [id number]    Level of Participation: pt did not attend group  Quality of Participation: pt did not attend group  Interactions with others: pt did not attend group  Mood/Affect: appropriate and positive Triggers (if applicable): n/a Cognition: coherent/clear Progress: Gaining insight Response: pt uses assistive devices to walk, he stated it is painful for him to sit in chairs for longer periods of time  Plan: patient will be encouraged to attend groups  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   Polysubstance dependence (HCC) 12/13/2023   Polysubstance abuse (HCC) 11/21/2023   Alcohol use disorder 08/04/2023   Stimulant use disorder 08/04/2023   Opioid use disorder 08/04/2023   Tobacco use disorder 08/04/2023   Cannabis use disorder 08/04/2023   Hepatitis C 03/01/2023   Anxiety state 01/14/2023   Insomnia 01/14/2023   Schizoaffective disorder (HCC) 01/13/2023   Schizophrenia (HCC) 11/15/2019   Polysubstance (including opioids) dependence, daily use (HCC)    Substance induced mood disorder (HCC) 10/24/2019   Methamphetamine use disorder, 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

## 2024-01-15 NOTE — Group Note (Signed)
 Group Topic: Change and Accountability  Group Date: 01/15/2024 Start Time: 1015 End Time: 1055 Facilitators: Brantley Caldwell B  Department: [facility]  Number of Participants: 6  Group Focus: daily focus and self-awareness Treatment Modality:  Psychoeducation Interventions utilized were patient education and problem solving Purpose: relapse prevention strategies  Name: Mark Hammond Date of Birth: 05/20/1991  MR: [id number]    Level of Participation: moderate Quality of Participation: cooperative Interactions with others: restless, moody Mood/Affect: flat and restless Triggers (if applicable): N/A Cognition: coherent/clear Progress: Moderate Response: N/A Plan: follow-up needed  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   Polysubstance dependence (HCC) 12/13/2023   Polysubstance abuse (HCC) 11/21/2023   Alcohol use disorder 08/04/2023   Stimulant use disorder 08/04/2023   Opioid use disorder 08/04/2023   Tobacco use disorder 08/04/2023   Cannabis use disorder 08/04/2023   Hepatitis C 03/01/2023   Anxiety state 01/14/2023   Insomnia 01/14/2023   Schizoaffective disorder (HCC) 01/13/2023   Schizophrenia (HCC) 11/15/2019   Polysubstance (including opioids) dependence, daily use (HCC)    Substance induced mood disorder (HCC) 10/24/2019   Methamphetamine use disorder, 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

## 2024-01-15 NOTE — ED Provider Notes (Addendum)
 Facility Based Crisis Progress Note  Date: 01/15/24 Patient Name: Mark Hammond MRN: [id number] Chief Complaint: Depression, substance use  Diagnoses:  Final diagnoses:  Hypokalemia  Alcohol abuse  Opiate abuse, continuous (HCC)   Mark Hammond is a 33 yo male with a history of polysubstance use disorder (alcohol, tobacco cocaine, opiates, and cannabis), schizoaffective disorder, self-reported bipolar disorder who initially presented to the [facility] on 01/12/24 voluntarily for complaints of psychosis and requesting detox from ongoing alcohol, cocaine, and cannabis use.    Interval history Patient seen in the milieu using his walker, no acute distress. Patient reports feeling "okay" today. Patient reports good sleep and fair appetite. Regarding withdrawal symptoms, he reports cold and heat intolerance. Regarding cravings, he reports alcohol cravings. Patient denies current SI, HI, and AVH. He requests his gabapentin be increased and I discussed that we will increase the PRN flexeril for his leg. Regarding discharge plans, he plans on returning back to his uncle's house. He states his main triggers are people who negatively influence him so his plan is to avoid those people when he returns. He is interested in [facility] and I discussed that we will get him connected.    Substance Use Hx: Alcohol: started at age 5. 1/5th and 4 40 oz, 2 16 oz, and 2 cartons a  wine daily.  [facility] week [facility] sobriety at [facility] time.  Dneies seizures or DTs Tobacco: 3 cigarretes daily, started ta age 47 Cannabis:1 bowl of weed a day, smokes it daily - slows his usage of other, appetite Cocaine: smokes, $40 daily Methamphetamines: denies Opiates (fentanyl / heroin): oxycodone and percocets, depending on what is available to him . Reports using approximately 4 pills daily.  IVDU: denies  Past Psychiatric Hx: No current outpatient psychiatric follow-up Patient reports being previously diagnosed with bipolar disorder and  depression. No current psychotropic medications Unable to recall  Past Medical History: PCP:No Medical Dx: Chronic unspecified left leg injury Allergies: codeine Trauma:Denies Seizures:Denies  Family Medical History: Unknown to pt  Family Psychiatric History: Unknown to pt  Social History: Patient lives in [place] with girlfriend Social Support: girlfriend Occupational WU:[id number] Children:denies   PHQ 2-9:  Flowsheet Row ED from 01/14/2024 in [facility] ED from 12/08/2023 in [facility] ED from 11/21/2023 in [facility]  Thoughts that you would be better off dead, or of hurting yourself in some way Several days Not at all Not at all  PHQ-9 Total Score 14 0 8       Flowsheet Row ED from 01/14/2024 in [facility] Most recent reading at 01/14/2024 10:55 AM ED from 01/12/2024 in [facility] Emergency Department at [facility] Most recent reading at 01/13/2024 12:33 AM ED from 01/12/2024 in [facility] Emergency Department at [facility] Most recent reading at 01/12/2024 12:41 PM  C-SSRS RISK CATEGORY No Risk Moderate Risk No Risk       Screenings    Flowsheet Row Most Recent Value  CIWA-Ar Total 3  COWS Total Score 7       Psychiatric Specialty Exam  Presentation General Appearance:  Appropriate for Environment; Fairly Groomed  Eye Contact: Fair  Speech: Clear and Coherent; Normal Rate  Speech Volume: Normal  Handedness: Right   Mood and Affect  Mood: Depressed  Affect: Congruent; Appropriate; Restricted   Thought Process  Thought Processes: Coherent  Descriptions of Associations:Intact  Orientation:Full (Time, Place and Person)  Thought Content:Logical  Diagnosis of Schizophrenia or Schizoaffective disorder in past: No   Hallucinations:Hallucinations: None   Ideas of Reference:None  Suicidal Thoughts:Suicidal  Thoughts: No   Homicidal Thoughts:Homicidal Thoughts: No    Sensorium  Memory: Remote Good  Judgment: Impaired  Insight: Fair   Art therapist  Concentration: Good  Attention Span: Good  Recall: Good  Fund of Knowledge: Good  Language: Good   Psychomotor Activity  Psychomotor Activity: Psychomotor Activity: Normal    Assets  Assets: Communication Skills; Resilience; Social Support   Sleep  Sleep: Sleep: Good Number of Hours of Sleep: 6    Nutritional Assessment (For OBS and [facility] admissions only) Has the patient had a weight loss or gain of 10 pounds or more in the last 3 months?: No Has the patient had a decrease in food intake/or appetite?: No Does the patient have dental problems?: No Does the patient have eating habits or behaviors that may be indicators of an eating disorder including binging or inducing vomiting?: No Has the patient recently lost weight without trying?: 0 Has the patient been eating poorly because of a decreased appetite?: 0 Malnutrition Screening Tool Score: 0     Physical Exam Vitals and nursing note reviewed.  Constitutional:      General: He is not in acute distress.    Appearance: He is not ill-appearing.  HENT:     Head: Normocephalic and atraumatic.  Eyes:     Extraocular Movements: Extraocular movements intact.     Conjunctiva/sclera: Conjunctivae normal.  Pulmonary:     Effort: Pulmonary effort is normal. No respiratory distress.  Skin:    General: Skin is warm and dry.  Neurological:     General: No focal deficit present.    Review of Systems  All other systems reviewed and are negative.   Blood pressure (!) 94/56, pulse 72, temperature 98.3 F (36.8 C), temperature source Oral, resp. rate 16, SpO2 98%. There is no height or weight on file to calculate BMI.    Last Labs:  Admission on 01/14/2024  Component Date Value Ref Range Status   SARS Coronavirus 2 by RT PCR 01/14/2024 NEGATIVE   NEGATIVE Final   Performed at [facility], [address], [place], [place] [address]  Admission on 01/12/2024, Discharged on 01/14/2024  Component Date Value Ref Range Status   Sodium 01/12/2024 134 (L)  135 - 145 mmol/L Final   Potassium 01/12/2024 2.9 (L)  3.5 - 5.1 mmol/L Final   Chloride 01/12/2024 96 (L)  98 - 111 mmol/L Final   CO2 01/12/2024 26  22 - 32 mmol/L Final   Glucose, Bld 01/12/2024 171 (H)  70 - 99 mg/dL Final   Glucose reference range applies only to samples taken after fasting for at least 8 hours.   BUN 01/12/2024 8  6 - 20 mg/dL Final   Creatinine, Ser 01/12/2024 0.60 (L)  0.61 - 1.24 mg/dL Final   Calcium 60/45/4098 8.5 (L)  8.9 - 10.3 mg/dL Final   Total Protein 11/91/4782 7.5  6.5 - 8.1 g/dL Final   Albumin 95/62/1308 2.8 (L)  3.5 - 5.0 g/dL Final   AST 65/78/4696 49 (H)  15 - 41 U/L Final   ALT 01/12/2024 48 (H)  0 - 44 U/L Final   Alkaline Phosphatase 01/12/2024 112  38 - 126 U/L Final   Total Bilirubin 01/12/2024 0.5  0.0 - 1.2 mg/dL Final   GFR, Estimated 01/12/2024 >60  >60 mL/min Final   Comment: (NOTE) Calculated using  the CKD-EPI Creatinine Equation (2021)    Anion gap 01/12/2024 12  5 - 15 Final   Performed at [facility], [address] [place], [place] [address]   Alcohol, Ethyl (B) 01/12/2024 194 (H)  <10 mg/dL Final   Comment: (NOTE) Lowest detectable limit for serum alcohol is 10 mg/dL.  For medical purposes only. Performed at [facility], [address] [place], [place] [address]    Opiates 01/12/2024 POSITIVE (A)  NONE DETECTED Final   Cocaine 01/12/2024 POSITIVE (A)  NONE DETECTED Final   Benzodiazepines 01/12/2024 NONE DETECTED  NONE DETECTED Final   Amphetamines 01/12/2024 NONE DETECTED  NONE DETECTED Final   Tetrahydrocannabinol 01/12/2024 POSITIVE (A)  NONE DETECTED Final   Barbiturates 01/12/2024 NONE DETECTED  NONE DETECTED Final   Comment: (NOTE) DRUG SCREEN FOR MEDICAL  PURPOSES ONLY.  IF CONFIRMATION IS NEEDED FOR ANY PURPOSE, NOTIFY LAB WITHIN 5 DAYS.  LOWEST DETECTABLE LIMITS FOR URINE DRUG SCREEN Drug Class                     Cutoff (ng/mL) Amphetamine and metabolites    1000 Barbiturate and metabolites    200 Benzodiazepine                 200 Opiates and metabolites        300 Cocaine and metabolites        300 THC                            50 Performed at [facility], [address] [place], [place] [address]    WBC 01/12/2024 11.3 (H)  4.0 - 10.5 K/uL Final   RBC 01/12/2024 3.99 (L)  4.22 - 5.81 MIL/uL Final   Hemoglobin 01/12/2024 11.7 (L)  13.0 - 17.0 g/dL Final   HCT 91/47/8295 36.1 (L)  39.0 - 52.0 % Final   MCV 01/12/2024 90.5  80.0 - 100.0 fL Final   MCH 01/12/2024 29.3  26.0 - 34.0 pg Final   MCHC 01/12/2024 32.4  30.0 - 36.0 g/dL Final   RDW 62/13/0865 13.6  11.5 - 15.5 % Final   Platelets 01/12/2024 477 (H)  150 - 400 K/uL Final   nRBC 01/12/2024 0.0  0.0 - 0.2 % Final   Neutrophils Relative % 01/12/2024 76  % Final   Neutro Abs 01/12/2024 8.6 (H)  1.7 - 7.7 K/uL Final   Lymphocytes Relative 01/12/2024 16  % Final   Lymphs Abs 01/12/2024 1.8  0.7 - 4.0 K/uL Final   Monocytes Relative 01/12/2024 6  % Final   Monocytes Absolute 01/12/2024 0.7  0.1 - 1.0 K/uL Final   Eosinophils Relative 01/12/2024 1  % Final   Eosinophils Absolute 01/12/2024 0.1  0.0 - 0.5 K/uL Final   Basophils Relative 01/12/2024 1  % Final   Basophils Absolute 01/12/2024 0.1  0.0 - 0.1 K/uL Final   Immature Granulocytes 01/12/2024 0  % Final   Abs Immature Granulocytes 01/12/2024 0.04  0.00 - 0.07 K/uL Final   Performed at [facility], [address], [place], [place] [address]   Sodium 01/13/2024 135  135 - 145 mmol/L Final   Potassium 01/13/2024 3.4 (L)  3.5 - 5.1 mmol/L Final   Chloride 01/13/2024 100  98 - 111 mmol/L Final   CO2 01/13/2024 28  22 - 32 mmol/L Final   Glucose, Bld 01/13/2024 106 (H)  70 - 99  mg/dL Final   Glucose reference range applies only to samples taken after fasting for at least 8 hours.   BUN 01/13/2024 8  6 - 20 mg/dL Final   Creatinine, Ser 01/13/2024 0.34 (L)  0.61 - 1.24 mg/dL Final   Calcium 40/98/1191 7.9 (L)  8.9 - 10.3 mg/dL Final   Total Protein 47/82/9562 7.1  6.5 - 8.1 g/dL Final   Albumin 13/05/6577 2.6 (L)  3.5 - 5.0 g/dL Final   AST 46/96/2952 41  15 - 41 U/L Final   ALT 01/13/2024 43  0 - 44 U/L Final   Alkaline Phosphatase 01/13/2024 125  38 - 126 U/L Final   Total Bilirubin 01/13/2024 0.3  0.0 - 1.2 mg/dL Final   GFR, Estimated 01/13/2024 >60  >60 mL/min Final   Comment: (NOTE) Calculated using the CKD-EPI Creatinine Equation (2021)    Anion gap 01/13/2024 7  5 - 15 Final   Performed at [facility], [address], [place], [place] [address]  Admission on 01/05/2024, Discharged on 01/06/2024  Component Date Value Ref Range Status   Sodium 01/05/2024 131 (L)  135 - 145 mmol/L Final   Potassium 01/05/2024 3.4 (L)  3.5 - 5.1 mmol/L Final   Chloride 01/05/2024 96 (L)  98 - 111 mmol/L Final   CO2 01/05/2024 26  22 - 32 mmol/L Final   Glucose, Bld 01/05/2024 118 (H)  70 - 99 mg/dL Final   Glucose reference range applies only to samples taken after fasting for at least 8 hours.   BUN 01/05/2024 7  6 - 20 mg/dL Final   Creatinine, Ser 01/05/2024 0.51 (L)  0.61 - 1.24 mg/dL Final   Calcium 44/10/270 8.6 (L)  8.9 - 10.3 mg/dL Final   GFR, Estimated 01/05/2024 >60  >60 mL/min Final   Comment: (NOTE) Calculated using the CKD-EPI Creatinine Equation (2021)    Anion gap 01/05/2024 9  5 - 15 Final   Performed at [facility], [address], [place], [place] [address]   WBC 01/05/2024 8.9  4.0 - 10.5 K/uL Final   RBC 01/05/2024 3.56 (L)  4.22 - 5.81 MIL/uL Final   Hemoglobin 01/05/2024 10.7 (L)  13.0 - 17.0 g/dL Final   HCT 40/34/7425 32.3 (L)  39.0 - 52.0 % Final   MCV 01/05/2024 90.7  80.0 - 100.0 fL Final   MCH  01/05/2024 30.1  26.0 - 34.0 pg Final   MCHC 01/05/2024 33.1  30.0 - 36.0 g/dL Final   RDW 95/63/8756 13.1  11.5 - 15.5 % Final   Platelets 01/05/2024 349  150 - 400 K/uL Final   nRBC 01/05/2024 0.0  0.0 - 0.2 % Final   Performed at [facility], [address] [place], [place] [address]   Color, Urine 01/06/2024 YELLOW  YELLOW Final   APPearance 01/06/2024 CLEAR  CLEAR Final   Specific Gravity, Urine 01/06/2024 1.013  1.005 - 1.030 Final   pH 01/06/2024 5.0  5.0 - 8.0 Final   Glucose, UA 01/06/2024 NEGATIVE  NEGATIVE mg/dL Final   Hgb urine dipstick 01/06/2024 NEGATIVE  NEGATIVE Final   Bilirubin Urine 01/06/2024 NEGATIVE  NEGATIVE Final   Ketones, ur 01/06/2024 NEGATIVE  NEGATIVE mg/dL Final   Protein, ur 51/88/4166 NEGATIVE  NEGATIVE mg/dL Final   Nitrite 04/01/1600 NEGATIVE  NEGATIVE Final   Leukocytes,Ua 01/06/2024 NEGATIVE  NEGATIVE Final   Performed at [facility], [address], [place], [place] [address]  Glucose-Capillary 01/05/2024 113 (H)  70 - 99 mg/dL Final   Glucose reference range applies only to samples taken after fasting for at least 8 hours.   Total CK 01/05/2024 116  49 - 397 U/L Final   Performed at [facility], [address] [place], [place] [address]  Admission on 12/13/2023, Discharged on 12/15/2023  Component Date Value Ref Range Status   WBC 12/13/2023 10.4  4.0 - 10.5 K/uL Final   RBC 12/13/2023 4.17 (L)  4.22 - 5.81 MIL/uL Final   Hemoglobin 12/13/2023 12.8 (L)  13.0 - 17.0 g/dL Final   HCT 95/62/1308 39.2  39.0 - 52.0 % Final   MCV 12/13/2023 94.0  80.0 - 100.0 fL Final   MCH 12/13/2023 30.7  26.0 - 34.0 pg Final   MCHC 12/13/2023 32.7  30.0 - 36.0 g/dL Final   RDW 65/78/4696 14.2  11.5 - 15.5 % Final   Platelets 12/13/2023 360  150 - 400 K/uL Final   nRBC 12/13/2023 0.0  0.0 - 0.2 % Final   Neutrophils Relative % 12/13/2023 62  % Final   Neutro Abs 12/13/2023 6.4  1.7 - 7.7 K/uL Final    Lymphocytes Relative 12/13/2023 25  % Final   Lymphs Abs 12/13/2023 2.6  0.7 - 4.0 K/uL Final   Monocytes Relative 12/13/2023 11  % Final   Monocytes Absolute 12/13/2023 1.2 (H)  0.1 - 1.0 K/uL Final   Eosinophils Relative 12/13/2023 1  % Final   Eosinophils Absolute 12/13/2023 0.2  0.0 - 0.5 K/uL Final   Basophils Relative 12/13/2023 1  % Final   Basophils Absolute 12/13/2023 0.1  0.0 - 0.1 K/uL Final   Immature Granulocytes 12/13/2023 0  % Final   Abs Immature Granulocytes 12/13/2023 0.04  0.00 - 0.07 K/uL Final   Performed at [facility], [address], [place], [place] [address]   Sodium 12/13/2023 137  135 - 145 mmol/L Final   Potassium 12/13/2023 3.9  3.5 - 5.1 mmol/L Final   Chloride 12/13/2023 104  98 - 111 mmol/L Final   CO2 12/13/2023 23  22 - 32 mmol/L Final   Glucose, Bld 12/13/2023 107 (H)  70 - 99 mg/dL Final   Glucose reference range applies only to samples taken after fasting for at least 8 hours.   BUN 12/13/2023 11  6 - 20 mg/dL Final   Creatinine, Ser 12/13/2023 0.62  0.61 - 1.24 mg/dL Final   Calcium 41/32/4401 8.8 (L)  8.9 - 10.3 mg/dL Final   Total Protein 02/72/5366 8.1  6.5 - 8.1 g/dL Final   Albumin 44/12/4740 2.9 (L)  3.5 - 5.0 g/dL Final   AST 59/56/3875 155 (H)  15 - 41 U/L Final   ALT 12/13/2023 118 (H)  0 - 44 U/L Final   Alkaline Phosphatase 12/13/2023 128 (H)  38 - 126 U/L Final   Total Bilirubin 12/13/2023 0.4  0.0 - 1.2 mg/dL Final   GFR, Estimated 12/13/2023 >60  >60 mL/min Final   Comment: (NOTE) Calculated using the CKD-EPI Creatinine Equation (2021)    Anion gap 12/13/2023 10  5 - 15 Final   Performed at [facility], [address], [place], [place] [address]   Alcohol, Ethyl (B) 12/13/2023 204 (H)  <10 mg/dL Final   Comment: (NOTE) Lowest detectable limit for serum alcohol is 10 mg/dL.  For medical purposes only. Performed at [facility], [address] [place], [place]  [address]  Salicylate Lvl 12/13/2023 <7.0 (L)  7.0 - 30.0 mg/dL Final   Performed at [facility], [address] [place], [place] [address]   Acetaminophen (Tylenol), Serum 12/13/2023 11  10 - 30 ug/mL Final   Comment: (NOTE) Therapeutic concentrations vary significantly. A range of 10-30 ug/mL  may be an effective concentration for many patients. However, some  are best treated at concentrations outside of this range. Acetaminophen concentrations >150 ug/mL at 4 hours after ingestion  and >50 ug/mL at 12 hours after ingestion are often associated with  toxic reactions.  Performed at [facility], [address], [place], [place] [address]    Opiates 12/13/2023 POSITIVE (A)  NONE DETECTED Final   Cocaine 12/13/2023 POSITIVE (A)  NONE DETECTED Final   Benzodiazepines 12/13/2023 POSITIVE (A)  NONE DETECTED Final   Amphetamines 12/13/2023 NONE DETECTED  NONE DETECTED Final   Tetrahydrocannabinol 12/13/2023 POSITIVE (A)  NONE DETECTED Final   Barbiturates 12/13/2023 NONE DETECTED  NONE DETECTED Final   Comment: (NOTE) DRUG SCREEN FOR MEDICAL PURPOSES ONLY.  IF CONFIRMATION IS NEEDED FOR ANY PURPOSE, NOTIFY LAB WITHIN 5 DAYS.  LOWEST DETECTABLE LIMITS FOR URINE DRUG SCREEN Drug Class                     Cutoff (ng/mL) Amphetamine and metabolites    1000 Barbiturate and metabolites    200 Benzodiazepine                 200 Opiates and metabolites        300 Cocaine and metabolites        300 THC                            50 Performed at [facility], [address], [place], [place] [address]   Admission on 12/08/2023, Discharged on 12/12/2023  Component Date Value Ref Range Status   Opiates 12/08/2023 NONE DETECTED  NONE DETECTED Final   Cocaine 12/08/2023 NONE DETECTED  NONE DETECTED Final   Benzodiazepines 12/08/2023 POSITIVE (A)  NONE DETECTED Final   Amphetamines 12/08/2023 NONE DETECTED  NONE DETECTED Final    Tetrahydrocannabinol 12/08/2023 POSITIVE (A)  NONE DETECTED Final   Barbiturates 12/08/2023 NONE DETECTED  NONE DETECTED Final   Comment: (NOTE) DRUG SCREEN FOR MEDICAL PURPOSES ONLY.  IF CONFIRMATION IS NEEDED FOR ANY PURPOSE, NOTIFY LAB WITHIN 5 DAYS.  LOWEST DETECTABLE LIMITS FOR URINE DRUG SCREEN Drug Class                     Cutoff (ng/mL) Amphetamine and metabolites    1000 Barbiturate and metabolites    200 Benzodiazepine                 200 Opiates and metabolites        300 Cocaine and metabolites        300 THC                            50 Performed at [facility], [address], [place], [place] [address]   Admission on 12/06/2023, Discharged on 12/08/2023  Component Date Value Ref Range Status   Alcohol, Ethyl (B) 12/06/2023 <10  <10 mg/dL Final   Comment: (NOTE) Lowest detectable limit for serum alcohol is 10 mg/dL.  For medical purposes only. Performed at [facility], [address], [place], [place] [address]    POC Amphetamine UR 12/06/2023 None Detected  NONE DETECTED (Cut Off Level 1000 ng/mL) Final   POC Secobarbital (BAR) 12/06/2023 None Detected  NONE DETECTED (Cut Off Level 300 ng/mL) Final   POC Buprenorphine (BUP) 12/06/2023 None Detected  NONE DETECTED (Cut Off Level 10 ng/mL) Final   POC Oxazepam (BZO) 12/06/2023 Positive (A)  NONE DETECTED (Cut Off Level 300 ng/mL) Final   POC Cocaine UR 12/06/2023 Positive (A)  NONE DETECTED (Cut Off Level 300 ng/mL) Final   POC Methamphetamine UR 12/06/2023 None Detected  NONE DETECTED (Cut Off Level 1000 ng/mL) Final   POC Morphine 12/06/2023 None Detected  NONE DETECTED (Cut Off Level 300 ng/mL) Final   POC Methadone UR 12/06/2023 None Detected  NONE DETECTED (Cut Off Level 300 ng/mL) Final   POC Oxycodone UR 12/06/2023 None Detected  NONE DETECTED (Cut Off Level 100 ng/mL) Final   POC Marijuana UR 12/06/2023 Positive (A)  NONE DETECTED (Cut Off Level 50 ng/mL) Final   WBC 12/08/2023 4.4  4.0 -  10.5 K/uL Final   RBC 12/08/2023 3.53 (L)  4.22 - 5.81 MIL/uL Final   Hemoglobin 12/08/2023 11.2 (L)  13.0 - 17.0 g/dL Final   HCT 91/47/8295 32.6 (L)  39.0 - 52.0 % Final   MCV 12/08/2023 92.4  80.0 - 100.0 fL Final   MCH 12/08/2023 31.7  26.0 - 34.0 pg Final   MCHC 12/08/2023 34.4  30.0 - 36.0 g/dL Final   RDW 62/13/0865 13.9  11.5 - 15.5 % Final   Platelets 12/08/2023 280  150 - 400 K/uL Final   nRBC 12/08/2023 0.0  0.0 - 0.2 % Final   Neutrophils Relative % 12/08/2023 59  % Final   Neutro Abs 12/08/2023 2.6  1.7 - 7.7 K/uL Final   Lymphocytes Relative 12/08/2023 27  % Final   Lymphs Abs 12/08/2023 1.2  0.7 - 4.0 K/uL Final   Monocytes Relative 12/08/2023 9  % Final   Monocytes Absolute 12/08/2023 0.4  0.1 - 1.0 K/uL Final   Eosinophils Relative 12/08/2023 3  % Final   Eosinophils Absolute 12/08/2023 0.1  0.0 - 0.5 K/uL Final   Basophils Relative 12/08/2023 1  % Final   Basophils Absolute 12/08/2023 0.1  0.0 - 0.1 K/uL Final   Immature Granulocytes 12/08/2023 1  % Final   Abs Immature Granulocytes 12/08/2023 0.02  0.00 - 0.07 K/uL Final   Performed at [facility], [address], [place], [place] [address]   Sodium 12/08/2023 139  135 - 145 mmol/L Final   Potassium 12/08/2023 4.0  3.5 - 5.1 mmol/L Final   Chloride 12/08/2023 100  98 - 111 mmol/L Final   CO2 12/08/2023 27  22 - 32 mmol/L Final   Glucose, Bld 12/08/2023 114 (H)  70 - 99 mg/dL Final   Glucose reference range applies only to samples taken after fasting for at least 8 hours.   BUN 12/08/2023 9  6 - 20 mg/dL Final   Creatinine, Ser 12/08/2023 0.60 (L)  0.61 - 1.24 mg/dL Final   Calcium 62/95/2841 8.7 (L)  8.9 - 10.3 mg/dL Final   Total Protein 32/44/0102 6.2 (L)  6.5 - 8.1 g/dL Final   Albumin 72/53/6644 2.3 (L)  3.5 - 5.0 g/dL Final   AST 03/47/4259 101 (H)  15 - 41 U/L Final   ALT 12/08/2023 55 (H)  0 - 44 U/L Final   Alkaline Phosphatase 12/08/2023 125  38 - 126 U/L Final   Total Bilirubin 12/08/2023 0.4   0.0 - 1.2 mg/dL Final   GFR, Estimated 12/08/2023 >60  >60 mL/min Final   Comment: (NOTE) Calculated using the CKD-EPI Creatinine Equation (2021)    Anion gap 12/08/2023 12  5 - 15 Final   Performed at [facility], [address] [place], [place] [address]   Cholesterol 12/08/2023 96  0 - 200 mg/dL Final   Triglycerides 84/69/6295 55  <150 mg/dL Final   HDL 28/41/3244 34 (L)  >40 mg/dL Final   Total CHOL/HDL Ratio 12/08/2023 2.8  RATIO Final   VLDL 12/08/2023 11  0 - 40 mg/dL Final   LDL Cholesterol 12/08/2023 51  0 - 99 mg/dL Final   Comment:        Total Cholesterol/HDL:CHD Risk Coronary Heart Disease Risk Table                     Men   Women  1/2 Average Risk   3.4   3.3  Average Risk       5.0   4.4  2 X Average Risk   9.6   7.1  3 X Average Risk  23.4   11.0        Use the calculated Patient Ratio above and the CHD Risk Table to determine the patient's CHD Risk.        ATP III CLASSIFICATION (LDL):  <100     mg/dL   Optimal  010-272  mg/dL   Near or Above                    Optimal  130-159  mg/dL   Borderline  536-644  mg/dL   High  >034     mg/dL   Very High Performed at [facility], [address] [place], [place] [address]    Magnesium 12/08/2023 1.7  1.7 - 2.4 mg/dL Final   Performed at [facility], [address], [place], [place] [address]   TSH 12/08/2023 0.714  0.350 - 4.500 uIU/mL Final   Comment: Performed by a 3rd Generation assay with a functional sensitivity of <=0.01 uIU/mL. Performed at [facility], [address], [place], [place] [address]    Hgb A1c MFr Bld 12/08/2023 6.0 (H)  4.8 - 5.6 % Final   Comment: (NOTE) Pre diabetes:          5.7%-6.4%  Diabetes:              >6.4%  Glycemic control for   <7.0% adults with diabetes    Mean Plasma Glucose 12/08/2023 125.5  mg/dL Final   Performed at [facility], [address] [place], [place] [address]  Admission on 12/04/2023, Discharged on 12/04/2023   Component Date Value Ref Range Status   Color, Urine 12/04/2023 YELLOW  YELLOW Final   APPearance 12/04/2023 CLEAR  CLEAR Final   Specific Gravity, Urine 12/04/2023 1.029  1.005 - 1.030 Final   pH 12/04/2023 5.0  5.0 - 8.0 Final   Glucose, UA 12/04/2023 NEGATIVE  NEGATIVE mg/dL Final   Hgb urine dipstick 12/04/2023 NEGATIVE  NEGATIVE Final   Bilirubin Urine 12/04/2023 NEGATIVE  NEGATIVE Final   Ketones, ur 12/04/2023 NEGATIVE  NEGATIVE mg/dL Final   Protein, ur 84/16/6063 NEGATIVE  NEGATIVE mg/dL Final   Nitrite 01/60/1093 NEGATIVE  NEGATIVE Final   Leukocytes,Ua 12/04/2023 NEGATIVE  NEGATIVE Final   Performed  at [facility], [address], [place], [place] [address]  Admission on 11/27/2023, Discharged on 11/28/2023  Component Date Value Ref Range Status   WBC 11/27/2023 13.5 (H)  4.0 - 10.5 K/uL Final   RBC 11/27/2023 4.12 (L)  4.22 - 5.81 MIL/uL Final   Hemoglobin 11/27/2023 12.8 (L)  13.0 - 17.0 g/dL Final   HCT 43/32/9518 38.4 (L)  39.0 - 52.0 % Final   MCV 11/27/2023 93.2  80.0 - 100.0 fL Final   MCH 11/27/2023 31.1  26.0 - 34.0 pg Final   MCHC 11/27/2023 33.3  30.0 - 36.0 g/dL Final   RDW 84/16/6063 13.4  11.5 - 15.5 % Final   Platelets 11/27/2023 375  150 - 400 K/uL Final   nRBC 11/27/2023 0.0  0.0 - 0.2 % Final   Performed at [facility], [address], [place], [place] [address]   Sodium 11/27/2023 132 (L)  135 - 145 mmol/L Final   Potassium 11/27/2023 4.3  3.5 - 5.1 mmol/L Final   Chloride 11/27/2023 95 (L)  98 - 111 mmol/L Final   CO2 11/27/2023 21 (L)  22 - 32 mmol/L Final   Glucose, Bld 11/27/2023 113 (H)  70 - 99 mg/dL Final   Glucose reference range applies only to samples taken after fasting for at least 8 hours.   BUN 11/27/2023 15  6 - 20 mg/dL Final   Creatinine, Ser 11/27/2023 0.86  0.61 - 1.24 mg/dL Final   Calcium 09/32/3557 9.5  8.9 - 10.3 mg/dL Final   GFR, Estimated 11/27/2023 >60  >60 mL/min Final   Comment:  (NOTE) Calculated using the CKD-EPI Creatinine Equation (2021)    Anion gap 11/27/2023 16 (H)  5 - 15 Final   Performed at [facility], [address], [place], [place] [address]   Salicylate Lvl 11/27/2023 <7.0 (L)  7.0 - 30.0 mg/dL Final   Performed at [facility], [address], [place], [place] [address]   Acetaminophen (Tylenol), Serum 11/27/2023 10  10 - 30 ug/mL Final   Comment: (NOTE) Therapeutic concentrations vary significantly. A range of 10-30 ug/mL  may be an effective concentration for many patients. However, some  are best treated at concentrations outside of this range. Acetaminophen concentrations >150 ug/mL at 4 hours after ingestion  and >50 ug/mL at 12 hours after ingestion are often associated with  toxic reactions.  Performed at [facility], [address] [place], [place] [address]    Alcohol, Ethyl (B) 11/27/2023 127 (H)  <10 mg/dL Final   Comment: (NOTE) Lowest detectable limit for serum alcohol is 10 mg/dL.  For medical purposes only. Performed at [facility], [address], [place], [place] [address]    Opiates 11/27/2023 POSITIVE (A)  NONE DETECTED Final   Cocaine 11/27/2023 POSITIVE (A)  NONE DETECTED Final   Benzodiazepines 11/27/2023 POSITIVE (A)  NONE DETECTED Final   Amphetamines 11/27/2023 NONE DETECTED  NONE DETECTED Final   Tetrahydrocannabinol 11/27/2023 POSITIVE (A)  NONE DETECTED Final   Barbiturates 11/27/2023 NONE DETECTED  NONE DETECTED Final   Comment: (NOTE) DRUG SCREEN FOR MEDICAL PURPOSES ONLY.  IF CONFIRMATION IS NEEDED FOR ANY PURPOSE, NOTIFY LAB WITHIN 5 DAYS.  LOWEST DETECTABLE LIMITS FOR URINE DRUG SCREEN Drug Class                     Cutoff (ng/mL) Amphetamine and metabolites    1000 Barbiturate and metabolites  200 Benzodiazepine                 200 Opiates and metabolites        300 Cocaine and metabolites        300 THC                            50 Performed at [facility], [address] [place], [place] [address]    Color, Urine 11/27/2023 AMBER (A)  YELLOW Final   BIOCHEMICALS MAY BE AFFECTED BY COLOR   APPearance 11/27/2023 CLEAR  CLEAR Final   Specific Gravity, Urine 11/27/2023 1.020  1.005 - 1.030 Final   pH 11/27/2023 5.0  5.0 - 8.0 Final   Glucose, UA 11/27/2023 NEGATIVE  NEGATIVE mg/dL Final   Hgb urine dipstick 11/27/2023 NEGATIVE  NEGATIVE Final   Bilirubin Urine 11/27/2023 NEGATIVE  NEGATIVE Final   Ketones, ur 11/27/2023 NEGATIVE  NEGATIVE mg/dL Final   Protein, ur 19/14/7829 NEGATIVE  NEGATIVE mg/dL Final   Nitrite 56/21/3086 NEGATIVE  NEGATIVE Final   Leukocytes,Ua 11/27/2023 NEGATIVE  NEGATIVE Final   Performed at [facility], [address] [place], [place] [address]   Neisseria Gonorrhea 11/27/2023 Negative   Final   Chlamydia 11/27/2023 Negative   Final   Comment 11/27/2023 Normal Reference Ranger Chlamydia - Negative   Final   Comment 11/27/2023 Normal Reference Range Neisseria Gonorrhea - Negative   Final  Admission on 11/21/2023, Discharged on 11/27/2023  Component Date Value Ref Range Status   SARS Coronavirus 2 by RT PCR 11/25/2023 NEGATIVE  NEGATIVE Final   Performed at [facility], [address] [place], [place] [address]   WBC 11/21/2023 5.6  4.0 - 10.5 K/uL Final   RBC 11/21/2023 4.14 (L)  4.22 - 5.81 MIL/uL Final   Hemoglobin 11/21/2023 13.1  13.0 - 17.0 g/dL Final   HCT 28/41/3244 38.8 (L)  39.0 - 52.0 % Final   MCV 11/21/2023 93.7  80.0 - 100.0 fL Final   MCH 11/21/2023 31.6  26.0 - 34.0 pg Final   MCHC 11/21/2023 33.8  30.0 - 36.0 g/dL Final   RDW 10/05/7251 13.2  11.5 - 15.5 % Final   Platelets 11/21/2023 298  150 - 400 K/uL Final   nRBC 11/21/2023 0.0  0.0 - 0.2 % Final   Neutrophils Relative % 11/21/2023 83  % Final   Neutro Abs 11/21/2023 4.6  1.7 - 7.7 K/uL Final   Lymphocytes Relative 11/21/2023 14  % Final   Lymphs Abs 11/21/2023 0.8  0.7 - 4.0 K/uL Final   Monocytes Relative 11/21/2023 3  %  Final   Monocytes Absolute 11/21/2023 0.2  0.1 - 1.0 K/uL Final   Eosinophils Relative 11/21/2023 0  % Final   Eosinophils Absolute 11/21/2023 0.0  0.0 - 0.5 K/uL Final   Basophils Relative 11/21/2023 0  % Final   Basophils Absolute 11/21/2023 0.0  0.0 - 0.1 K/uL Final   Immature Granulocytes 11/21/2023 0  % Final   Abs Immature Granulocytes 11/21/2023 0.01  0.00 - 0.07 K/uL Final   Performed at [facility], [address], [place], [place] [address]   Sodium 11/21/2023 135  135 - 145 mmol/L Final   Potassium 11/21/2023 4.2  3.5 - 5.1 mmol/L Final   Chloride 11/21/2023 99  98 - 111 mmol/L Final   CO2 11/21/2023 26  22 - 32 mmol/L Final  Glucose, Bld 11/21/2023 162 (H)  70 - 99 mg/dL Final   Glucose reference range applies only to samples taken after fasting for at least 8 hours.   BUN 11/21/2023 7  6 - 20 mg/dL Final   Creatinine, Ser 11/21/2023 0.63  0.61 - 1.24 mg/dL Final   Calcium 16/07/9603 8.9  8.9 - 10.3 mg/dL Final   Total Protein 54/06/8118 8.1  6.5 - 8.1 g/dL Final   Albumin 14/78/2956 2.8 (L)  3.5 - 5.0 g/dL Final   AST 21/30/8657 91 (H)  15 - 41 U/L Final   ALT 11/21/2023 55 (H)  0 - 44 U/L Final   Alkaline Phosphatase 11/21/2023 87  38 - 126 U/L Final   Total Bilirubin 11/21/2023 0.8  0.0 - 1.2 mg/dL Final   GFR, Estimated 11/21/2023 >60  >60 mL/min Final   Comment: (NOTE) Calculated using the CKD-EPI Creatinine Equation (2021)    Anion gap 11/21/2023 10  5 - 15 Final   Performed at [facility], [address], [place], [place] [address]   Alcohol, Ethyl (B) 11/21/2023 <10  <10 mg/dL Final   Comment: (NOTE) Lowest detectable limit for serum alcohol is 10 mg/dL.  For medical purposes only. Performed at [facility], [address] [place], [place] [address]    TSH 11/21/2023 0.365  0.350 - 4.500 uIU/mL Final   Comment: Performed by a 3rd Generation assay with a functional sensitivity of <=0.01 uIU/mL. Performed at [facility], [address] [place], [place] [address]    POC Amphetamine UR 11/22/2023 None Detected  NONE DETECTED (Cut Off Level 1000 ng/mL) Final   POC Secobarbital (BAR) 11/22/2023 None Detected  NONE DETECTED (Cut Off Level 300 ng/mL) Final   POC Buprenorphine (BUP) 11/22/2023 None Detected  NONE DETECTED (Cut Off Level 10 ng/mL) Final   POC Oxazepam (BZO) 11/22/2023 Positive (A)  NONE DETECTED (Cut Off Level 300 ng/mL) Final   POC Cocaine UR 11/22/2023 Positive (A)  NONE DETECTED (Cut Off Level 300 ng/mL) Final   POC Methamphetamine UR 11/22/2023 None Detected  NONE DETECTED (Cut Off Level 1000 ng/mL) Final   POC Morphine 11/22/2023 None Detected  NONE DETECTED (Cut Off Level 300 ng/mL) Final   POC Methadone UR 11/22/2023 None Detected  NONE DETECTED (Cut Off Level 300 ng/mL) Final   POC Oxycodone UR 11/22/2023 None Detected  NONE DETECTED (Cut Off Level 100 ng/mL) Final   POC Marijuana UR 11/22/2023 Positive (A)  NONE DETECTED (Cut Off Level 50 ng/mL) Final   Hgb A1c MFr Bld 11/25/2023 5.9 (H)  4.8 - 5.6 % Final   Comment: (NOTE) Pre diabetes:          5.7%-6.4%  Diabetes:              >6.4%  Glycemic control for   <7.0% adults with diabetes    Mean Plasma Glucose 11/25/2023 122.63  mg/dL Final   Performed at [facility], [address], [place], [place] [address]   Sodium 11/25/2023 136  135 - 145 mmol/L Final   Potassium 11/25/2023 4.2  3.5 - 5.1 mmol/L Final   Chloride 11/25/2023 101  98 - 111 mmol/L Final   CO2 11/25/2023 27  22 - 32 mmol/L Final   Glucose, Bld 11/25/2023 108 (H)  70 - 99 mg/dL Final   Glucose reference range applies only to samples taken after fasting for at least 8 hours.   BUN 11/25/2023 9  6 -  20 mg/dL Final   Creatinine, Ser 11/25/2023 0.68  0.61 - 1.24 mg/dL Final   Calcium 21/30/8657 8.6 (L)  8.9 - 10.3 mg/dL Final   Total Protein 84/69/6295 7.0  6.5 - 8.1 g/dL Final   Albumin 28/41/3244 2.3 (L)  3.5 - 5.0 g/dL Final   AST 10/05/7251 81 (H)  15 - 41 U/L Final    ALT 11/25/2023 58 (H)  0 - 44 U/L Final   Alkaline Phosphatase 11/25/2023 84  38 - 126 U/L Final   Total Bilirubin 11/25/2023 0.7  0.0 - 1.2 mg/dL Final   GFR, Estimated 11/25/2023 >60  >60 mL/min Final   Comment: (NOTE) Calculated using the CKD-EPI Creatinine Equation (2021)    Anion gap 11/25/2023 8  5 - 15 Final   Performed at [facility], [address], [place], [place] [address]   Iron 11/25/2023 21 (L)  45 - 182 ug/dL Final   TIBC 34/74/2595 280  250 - 450 ug/dL Final   Saturation Ratios 11/25/2023 8 (L)  17.9 - 39.5 % Final   UIBC 11/25/2023 259  ug/dL Final   Performed at [name], [address] [place], [place] [address]   Vitamin B-12 11/25/2023 549  180 - 914 pg/mL Final   Comment: (NOTE) This assay is not validated for testing neonatal or myeloproliferative syndrome specimens for Vitamin B12 levels. Performed at [facility], [address] [place], [place] [address]    Ferritin 11/25/2023 217  24 - 336 ng/mL Final   Performed at [facility], [address] [place], [place] [address]  Admission on 11/19/2023, Discharged on 11/20/2023  Component Date Value Ref Range Status   Sodium 11/19/2023 138  135 - 145 mmol/L Final   Potassium 11/19/2023 3.4 (L)  3.5 - 5.1 mmol/L Final   Chloride 11/19/2023 103  98 - 111 mmol/L Final   CO2 11/19/2023 26  22 - 32 mmol/L Final   Glucose, Bld 11/19/2023 123 (H)  70 - 99 mg/dL Final   Glucose reference range applies only to samples taken after fasting for at least 8 hours.   BUN 11/19/2023 <5 (L)  6 - 20 mg/dL Final   Creatinine, Ser 11/19/2023 0.50 (L)  0.61 - 1.24 mg/dL Final   Calcium 41/66/0630 8.2 (L)  8.9 - 10.3 mg/dL Final   GFR, Estimated 11/19/2023 >60  >60 mL/min Final   Comment: (NOTE) Calculated using the CKD-EPI Creatinine Equation (2021)    Anion gap 11/19/2023 9  5 - 15 Final   Performed at [facility], [address] [place], [place] [address]   WBC 11/19/2023  8.4  4.0 - 10.5 K/uL Final   RBC 11/19/2023 4.00 (L)  4.22 - 5.81 MIL/uL Final   Hemoglobin 11/19/2023 12.6 (L)  13.0 - 17.0 g/dL Final   HCT 93/23/5573 37.7 (L)  39.0 - 52.0 % Final   MCV 11/19/2023 94.3  80.0 - 100.0 fL Final   MCH 11/19/2023 31.5  26.0 - 34.0 pg Final   MCHC 11/19/2023 33.4  30.0 - 36.0 g/dL Final   RDW 22/11/5425 13.2  11.5 - 15.5 % Final   Platelets 11/19/2023 267  150 - 400 K/uL Final   nRBC 11/19/2023 0.0  0.0 - 0.2 % Final   Neutrophils Relative % 11/19/2023 65  % Final   Neutro Abs 11/19/2023 5.5  1.7 - 7.7 K/uL Final   Lymphocytes Relative 11/19/2023 22  % Final  Lymphs Abs 11/19/2023 1.9  0.7 - 4.0 K/uL Final   Monocytes Relative 11/19/2023 11  % Final   Monocytes Absolute 11/19/2023 0.9  0.1 - 1.0 K/uL Final   Eosinophils Relative 11/19/2023 1  % Final   Eosinophils Absolute 11/19/2023 0.1  0.0 - 0.5 K/uL Final   Basophils Relative 11/19/2023 1  % Final   Basophils Absolute 11/19/2023 0.1  0.0 - 0.1 K/uL Final   Immature Granulocytes 11/19/2023 0  % Final   Abs Immature Granulocytes 11/19/2023 0.03  0.00 - 0.07 K/uL Final   Performed at [facility], [address] [place], [place] [address]   Sed Rate 11/19/2023 77 (H)  0 - 16 mm/hr Final   Performed at [facility], [address], [place], [place] [address]   CRP 11/19/2023 4.2 (H)  <1.0 mg/dL Final   Performed at [facility], [address], [place], [place] [address]   Sodium 11/19/2023 141  135 - 145 mmol/L Final   Potassium 11/19/2023 3.5  3.5 - 5.1 mmol/L Final   Chloride 11/19/2023 101  98 - 111 mmol/L Final   BUN 11/19/2023 <3 (L)  6 - 20 mg/dL Final   Creatinine, Ser 11/19/2023 0.70  0.61 - 1.24 mg/dL Final   Glucose, Bld 62/13/0865 126 (H)  70 - 99 mg/dL Final   Glucose reference range applies only to samples taken after fasting for at least 8 hours.   Calcium, Ion 11/19/2023 1.05 (L)  1.15 - 1.40 mmol/L Final   TCO2 11/19/2023 27  22 - 32  mmol/L Final   Hemoglobin 11/19/2023 12.9 (L)  13.0 - 17.0 g/dL Final   HCT 78/46/9629 38.0 (L)  39.0 - 52.0 % Final   SARS Coronavirus 2 by RT PCR 11/19/2023 NEGATIVE  NEGATIVE Final   Comment: (NOTE) SARS-CoV-2 target nucleic acids are NOT DETECTED.  The SARS-CoV-2 RNA is generally detectable in upper respiratory specimens during the acute phase of infection. The lowest concentration of SARS-CoV-2 viral copies this assay can detect is 138 copies/mL. A negative result does not preclude SARS-Cov-2 infection and should not be used as the sole basis for treatment or other patient management decisions. A negative result may occur with  improper specimen collection/handling, submission of specimen other than nasopharyngeal swab, presence of viral mutation(s) within the areas targeted by this assay, and inadequate number of viral copies(<138 copies/mL). A negative result must be combined with clinical observations, patient history, and epidemiological information. The expected result is Negative.  Fact Sheet for Patients:  BloggerCourse.com  Fact Sheet for Healthcare Providers:  SeriousBroker.it  This test is no                          t yet approved or cleared by the [place]  FDA and  has been authorized for detection and/or diagnosis of SARS-CoV-2 by FDA under an Emergency Use Authorization (EUA). This EUA will remain  in effect (meaning this test can be used) for the duration of the COVID-19 declaration under Section 564(b)(1) of the Act, 21 U.S.C.section 360bbb-3(b)(1), unless the authorization is terminated  or revoked sooner.       Influenza A by PCR 11/19/2023 NEGATIVE  NEGATIVE Final   Influenza B by PCR 11/19/2023 NEGATIVE  NEGATIVE Final   Comment: (NOTE) The Xpert Xpress SARS-CoV-2/FLU/RSV plus assay is intended as an aid in the diagnosis of influenza from Nasopharyngeal swab specimens and should not be used as  a  sole basis for treatment. Nasal washings and aspirates are unacceptable for Xpert Xpress SARS-CoV-2/FLU/RSV testing.  Fact Sheet for Patients: BloggerCourse.com  Fact Sheet for Healthcare Providers: SeriousBroker.it  This test is not yet approved or cleared by the [place] FDA and has been authorized for detection and/or diagnosis of SARS-CoV-2 by FDA under an Emergency Use Authorization (EUA). This EUA will remain in effect (meaning this test can be used) for the duration of the COVID-19 declaration under Section 564(b)(1) of the Act, 21 U.S.C. section 360bbb-3(b)(1), unless the authorization is terminated or revoked.     Resp Syncytial Virus by PCR 11/19/2023 NEGATIVE  NEGATIVE Final   Comment: (NOTE) Fact Sheet for Patients: BloggerCourse.com  Fact Sheet for Healthcare Providers: SeriousBroker.it  This test is not yet approved or cleared by the [place] FDA and has been authorized for detection and/or diagnosis of SARS-CoV-2 by FDA under an Emergency Use Authorization (EUA). This EUA will remain in effect (meaning this test can be used) for the duration of the COVID-19 declaration under Section 564(b)(1) of the Act, 21 U.S.C. section 360bbb-3(b)(1), unless the authorization is terminated or revoked.  Performed at [facility], [address] [place], [place] [address]   There may be more visits with results that are not included.    Allergies: Codeine  Medications:  Facility Ordered Medications  Medication   [COMPLETED] thiamine (VITAMIN B1) tablet 100 mg   LORazepam (ATIVAN) tablet 1 mg   loperamide (IMODIUM) capsule 2-4 mg   multivitamin with minerals tablet 1 tablet   nicotine polacrilex (NICORETTE) gum 2 mg   ondansetron (ZOFRAN-ODT) disintegrating tablet 4 mg   thiamine (VITAMIN B1) tablet 100 mg   acetaminophen (TYLENOL) tablet  650 mg   alum & mag hydroxide-simeth (MAALOX/MYLANTA) 200-200-20 MG/5ML suspension 30 mL   magnesium hydroxide (MILK OF MAGNESIA) suspension 30 mL   haloperidol (HALDOL) tablet 5 mg   And   diphenhydrAMINE (BENADRYL) capsule 50 mg   haloperidol lactate (HALDOL) injection 5 mg   And   diphenhydrAMINE (BENADRYL) injection 50 mg   And   LORazepam (ATIVAN) injection 2 mg   haloperidol lactate (HALDOL) injection 10 mg   And   diphenhydrAMINE (BENADRYL) injection 50 mg   And   LORazepam (ATIVAN) injection 2 mg   hydrOXYzine (ATARAX) tablet 25 mg   diphenhydrAMINE-zinc acetate (BENADRYL) 2-0.1 % cream   gabapentin (NEURONTIN) capsule 300 mg   nicotine (NICODERM CQ - dosed in mg/24 hours) patch 14 mg   [COMPLETED] potassium chloride SA (KLOR-CON M) CR tablet 40 mEq   ferrous sulfate tablet 325 mg   traZODone (DESYREL) tablet 50 mg   dicyclomine (BENTYL) tablet 20 mg   naproxen (NAPROSYN) tablet 500 mg   cyclobenzaprine (FLEXERIL) tablet 10 mg   PTA Medications  Medication Sig   hydrOXYzine (ATARAX) 25 MG tablet Take 1 tablet (25 mg total) by mouth every 6 (six) hours as needed for anxiety.   QUEtiapine (SEROQUEL) 100 MG tablet Take 1 tablet (100 mg total) by mouth at bedtime. (Patient taking differently: Take 100 mg by mouth at bedtime as needed (for sleep).)   thiamine (VITAMIN B-1) 100 MG tablet Take 1 tablet (100 mg total) by mouth daily. (Patient not taking: Reported on 01/13/2024)   Multiple Vitamin (MULTIVITAMIN WITH MINERALS) TABS tablet Take 1 tablet by mouth daily. (Patient not taking: Reported on 01/13/2024)   nicotine polacrilex (NICORETTE) 2 MG gum Take 1 each (2 mg total) by mouth as needed for  smoking cessation. (Patient not taking: Reported on 01/13/2024)   gabapentin (NEURONTIN) 300 MG capsule Take 2 capsules (600 mg total) by mouth 3 (three) times daily. (Patient taking differently: Take 300-600 mg by mouth 2 (two) times daily.)   methylPREDNISolone (MEDROL DOSEPAK) 4 MG TBPK  tablet Take as directed on packet (Patient taking differently: Take 4-24 mg by mouth See admin instructions. Take 24 mg by mouth with food on day one, then decrease by 4 mg once daily until finished)   predniSONE (DELTASONE) 20 MG tablet Take 2 tablets (40 mg total) by mouth daily. (Patient not taking: Reported on 01/13/2024)   acetaminophen (TYLENOL) 500 MG tablet Take 1 tablet (500 mg total) by mouth every 6 (six) hours as needed. (Patient taking differently: Take 500 mg by mouth every 6 (six) hours as needed for mild pain (pain score 1-3) or headache.)   Nicotine (NICODERM CQ TD) Place 1 patch onto the skin daily as needed (for smoking cessation while hospitalized).   traZODone (DESYREL) 50 MG tablet Take 50 mg by mouth at bedtime as needed for sleep.    Long Term Goals: Improvement in symptoms so as ready for discharge  Short Term Goals: Patient will verbalize feelings in meetings with treatment team members., Patient will attend at least of 50% of the groups daily., and Pt will complete the PHQ9 on admission, day 3 and discharge.  Medical Decision Making  Pt is appropriate for treatment at [facility] Crisis [facility] for detox.  UDS + cocaine, benzodiazepines and THC.    Stimulant Use Disorder Cannabis Use Disorder Nicotine Use Disorder Opiate abuse -COWS monitoring- most recent score is 7 -CIWA with Ativan PRN-most recent score is 3 -MVI/Thiamine  Iron Deficiency anemia -ferrous sulfate 325 mg daily -monitor for constipation  Leg Pain -gabapentin 300 mg bid -flexeril 10 mg tid prn  Dispo: pending CDIOP   Joice Nares, MD 01/15/24  10:55 AM

## 2024-01-15 NOTE — ED Notes (Signed)
 Patient is awake and alert on unit.  Presently attending an AA meeting.  Patient is in visible pain with limited movement in left leg, hip, thigh.  Patient using walker to help ambulate around unit.  Patient is generally pleasant on approach.  NO signs of withdrawal at this time.  Patient makes his needs known.  Will monitor.

## 2024-01-15 NOTE — ED Notes (Addendum)
 BP 94/96. Encoraging fluids. NP Shalon notified

## 2024-01-15 NOTE — Group Note (Signed)
 Group Topic: Relapse and Recovery  Group Date: 01/15/2024 Start Time: 1300 End Time: 1330 Facilitators: Arlan Belling, RN  Department: [facility]  Number of Participants: 7  Group Focus: chemical dependency education and chemical dependency issues Treatment Modality:  Behavior Modification Therapy Interventions utilized were clarification, confrontation, exploration, group exercise, and patient education Purpose: explore maladaptive thinking, express feelings, express irrational fears, improve communication skills, increase insight, regain self-worth, and reinforce self-care   Name: Kenai Fluegel Date of Birth: 1991-08-18  MR: [id number]    Level of Participation: moderate Quality of Participation: attentive Interactions with others: gave feedback Mood/Affect: appropriate Triggers (if applicable):   Cognition: coherent/clear Progress: Gaining insight Response:   Plan: follow-up needed  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   Polysubstance dependence (HCC) 12/13/2023   Polysubstance abuse (HCC) 11/21/2023   Alcohol use disorder 08/04/2023   Stimulant use disorder 08/04/2023   Opioid use disorder 08/04/2023   Tobacco use disorder 08/04/2023   Cannabis use disorder 08/04/2023   Hepatitis C 03/01/2023   Anxiety state 01/14/2023   Insomnia 01/14/2023   Schizoaffective disorder (HCC) 01/13/2023   Schizophrenia (HCC) 11/15/2019   Polysubstance (including opioids) dependence, daily use (HCC)    Substance induced mood disorder (HCC) 10/24/2019   Methamphetamine use disorder, 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

## 2024-01-15 NOTE — ED Notes (Signed)
 Pt is in his room resting in bed. Pt denies SI/HI/AVH. Pt endorses leg pain of 7/10. Pt  verbalizes agreement to take pain medication.No acute distress noted. Will continue to monitor for safety.

## 2024-01-16 DIAGNOSIS — E876 Hypokalemia: Secondary | ICD-10-CM | POA: Diagnosis not present

## 2024-01-16 DIAGNOSIS — F1010 Alcohol abuse, uncomplicated: Secondary | ICD-10-CM | POA: Diagnosis not present

## 2024-01-16 DIAGNOSIS — F1110 Opioid abuse, uncomplicated: Secondary | ICD-10-CM | POA: Diagnosis not present

## 2024-01-16 LAB — BASIC METABOLIC PANEL WITH GFR
Anion gap: 9 (ref 5–15)
BUN: 5 mg/dL — ABNORMAL LOW (ref 6–20)
CO2: 27 mmol/L (ref 22–32)
Calcium: 9.2 mg/dL (ref 8.9–10.3)
Chloride: 100 mmol/L (ref 98–111)
Creatinine, Ser: 0.64 mg/dL (ref 0.61–1.24)
GFR, Estimated: >60 mL/min (ref >60)
Glucose, Bld: 99 mg/dL (ref 70–99)
Potassium: 4 mmol/L (ref 3.5–5.1)
Sodium: 136 mmol/L (ref 135–145)

## 2024-01-16 NOTE — Group Note (Signed)
 Group Topic: Fears and Unhealthy Coping Skills  Group Date: 01/16/2024 Start Time: 1200 End Time: 1215 Facilitators: Arlan Belling, RN  Department: [facility]  Number of Participants: 8  Group Focus: check in Treatment Modality:  Behavior Modification Therapy Interventions utilized were exploration, group exercise, problem solving, and support Purpose: enhance coping skills, explore maladaptive thinking, express feelings, express irrational fears, improve communication skills, increase insight, regain self-worth, and reinforce self-care  Name: Mark Hammond Date of Birth: 1991/08/07  MR: [id number]    Level of Participation: minimal Quality of Participation: attentive Interactions with others: gave feedback Mood/Affect: appropriate Triggers (if applicable):   Cognition: coherent/clear Progress: Gaining insight Response:   Plan: follow-up needed  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   Polysubstance dependence (HCC) 12/13/2023   Polysubstance abuse (HCC) 11/21/2023   Alcohol use disorder 08/04/2023   Stimulant use disorder 08/04/2023   Opioid use disorder 08/04/2023   Tobacco use disorder 08/04/2023   Cannabis use disorder 08/04/2023   Hepatitis C 03/01/2023   Anxiety state 01/14/2023   Insomnia 01/14/2023   Schizoaffective disorder (HCC) 01/13/2023   Schizophrenia (HCC) 11/15/2019   Polysubstance (including opioids) dependence, daily use (HCC)    Substance induced mood disorder (HCC) 10/24/2019   Methamphetamine use disorder, 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

## 2024-01-16 NOTE — Group Note (Signed)
 Group Topic: Social Support  Group Date: 01/16/2024 Start Time: 2000 End Time: 2030 Facilitators: Wendall Halls B  Department: [facility]  Number of Participants: 1  Group Focus: check in Treatment Modality:  Leisure Development Interventions utilized were leisure development Purpose: express feelings, improve communication skills, increase insight, and regain self-worth  Name: Mark Hammond Date of Birth: Jun 21, 1991  MR: [id number]    Level of Participation: active Quality of Participation: attentive and cooperative Interactions with others: gave feedback Mood/Affect: appropriate Triggers (if applicable): NA Cognition: coherent/clear Progress: Gaining insight Response: NA Plan: patient will be encouraged to keep going to groups.   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   Polysubstance dependence (HCC) 12/13/2023   Polysubstance abuse (HCC) 11/21/2023   Alcohol use disorder 08/04/2023   Stimulant use disorder 08/04/2023   Opioid use disorder 08/04/2023   Tobacco use disorder 08/04/2023   Cannabis use disorder 08/04/2023   Hepatitis C 03/01/2023   Anxiety state 01/14/2023   Insomnia 01/14/2023   Schizoaffective disorder (HCC) 01/13/2023   Schizophrenia (HCC) 11/15/2019   Polysubstance (including opioids) dependence, daily use (HCC)    Substance induced mood disorder (HCC) 10/24/2019   Methamphetamine use disorder, 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

## 2024-01-16 NOTE — ED Notes (Signed)
 Pt presented lying in bed.   Blunted affect.   Denied SI/HI and A/V hallucinations.  Denied withdrawal symptoms at present.  Pt is using a walker to assist with moblization.  No falls thus far this shift. Q 15 observations for safety in place

## 2024-01-16 NOTE — ED Notes (Signed)
 Patient is sleeping. Respirations equal and unlabored, skin warm and dry. No change in assessment or acuity. Routine safety checks conducted according to facility protocol. Will continue to monitor for safety.

## 2024-01-16 NOTE — ED Notes (Signed)
 Naproxen given for hip pain #7.

## 2024-01-16 NOTE — ED Provider Notes (Signed)
 Facility Based Crisis Progress Note  Date: 01/16/24 Patient Name: Raziel Koenigs MRN: [id number] Chief Complaint: Depression, substance use  Diagnoses:  Final diagnoses:  Hypokalemia  Alcohol abuse  Opiate abuse, continuous (HCC)   Misty Rago is a 33 yo male with a history of polysubstance use disorder (alcohol, tobacco cocaine, opiates, and cannabis), schizoaffective disorder, self-reported bipolar disorder who initially presented to the [facility] on 01/12/24 voluntarily for complaints of psychosis and requesting detox from ongoing alcohol, cocaine, and cannabis use.    Interval history Patient seen in his room resting in bed, no acute distress. Patient reports feeling "good" today. Patient reports poor sleep due to leg pain. I encouraged patient to ask for flexeril before bed if he notices leg pain prior to going to bed. He reports going to group sessions yesterday and states it went well.  He reports good appetite. Regarding withdrawal symptoms, he denies. Regarding cravings, he reports cravings to alcohol and says "the groups made me want to drink more". I discussed how he told me yesterday that the reason he relapsed was due to drinking excessively. Patient denies current SI, HI, and AVH. Regarding discharge plans, he plans to return to his uncle's house tomorrow and follow-up with [facility] on Monday.    Substance Use Hx: Alcohol: started at age 33. 1/5th and 4 40 oz, 2 16 oz, and 2 cartons a  wine daily.  [facility] week [facility] sobriety at [facility] time.  Dneies seizures or DTs Tobacco: 3 cigarretes daily, started ta age 28 Cannabis:1 bowl of weed a day, smokes it daily - slows his usage of other, appetite Cocaine: smokes, $40 daily Methamphetamines: denies Opiates (fentanyl / heroin): oxycodone and percocets, depending on what is available to him . Reports using approximately 4 pills daily.  IVDU: denies  Past Psychiatric Hx: No current outpatient psychiatric follow-up Patient reports being  previously diagnosed with bipolar disorder and depression. No current psychotropic medications Unable to recall  Past Medical History: PCP:No Medical Dx: Chronic unspecified left leg injury Allergies: codeine Trauma:Denies Seizures:Denies  Family Medical History: Unknown to pt  Family Psychiatric History: Unknown to pt  Social History: Patient lives in [place] with girlfriend Social Support: girlfriend Occupational NW:[id number] Children:denies   PHQ 2-9:  Flowsheet Row ED from 01/14/2024 in [facility] ED from 12/08/2023 in [facility] ED from 11/21/2023 in [facility]  Thoughts that you would be better off dead, or of hurting yourself in some way Several days Not at all Not at all  PHQ-9 Total Score 14 0 8       Flowsheet Row ED from 01/14/2024 in [facility] Most recent reading at 01/14/2024 10:55 AM ED from 01/12/2024 in [facility] [place] Emergency Department at [facility] Most recent reading at 01/13/2024 12:33 AM ED from 01/12/2024 in [facility] Emergency Department at [facility] Most recent reading at 01/12/2024 12:41 PM  C-SSRS RISK CATEGORY No Risk Moderate Risk No Risk       Screenings    Flowsheet Row Most Recent Value  CIWA-Ar Total 0  COWS Total Score 1       Psychiatric Specialty Exam  Presentation General Appearance:  Appropriate for Environment; Fairly Groomed  Eye Contact: Fair  Speech: Clear and Coherent; Normal Rate  Speech Volume: Normal  Handedness: Right   Mood and Affect  Mood: Euthymic  Affect: Non-Congruent; Depressed   Thought Process  Thought Processes: Coherent; Linear  Descriptions  of Associations:Intact  Orientation:Full (Time, Place and Person)  Thought Content:Logical; WDL  Diagnosis of Schizophrenia or Schizoaffective disorder in past: No   Hallucinations:Hallucinations: None   Ideas  of Reference:None  Suicidal Thoughts:Suicidal Thoughts: No   Homicidal Thoughts:Homicidal Thoughts: No    Sensorium  Memory: Remote Good  Judgment: Fair  Insight: Fair   Art therapist  Concentration: Good  Attention Span: Good  Recall: Good  Fund of Knowledge: Good  Language: Good   Psychomotor Activity  Psychomotor Activity: Psychomotor Activity: Normal    Assets  Assets: Communication Skills; Resilience   Sleep  Sleep: Sleep: Fair    Physical Exam Vitals and nursing note reviewed.  Constitutional:      General: He is not in acute distress.    Appearance: He is not ill-appearing.  HENT:     Head: Normocephalic and atraumatic.  Eyes:     Extraocular Movements: Extraocular movements intact.     Conjunctiva/sclera: Conjunctivae normal.  Pulmonary:     Effort: Pulmonary effort is normal. No respiratory distress.  Skin:    General: Skin is warm and dry.  Neurological:     General: No focal deficit present.    Review of Systems  Constitutional:  Negative for chills and fever.  Gastrointestinal:  Negative for nausea and vomiting.  Musculoskeletal:  Positive for joint pain.  Neurological:  Negative for tremors.  All other systems reviewed and are negative.   Blood pressure 99/68, pulse 64, temperature 97.7 F (36.5 C), temperature source Oral, resp. rate 18, SpO2 100%. There is no height or weight on file to calculate BMI.    Last Labs:  Admission on 01/14/2024  Component Date Value Ref Range Status   SARS Coronavirus 2 by RT PCR 01/14/2024 NEGATIVE  NEGATIVE Final   Performed at [facility], [address] [place], [place] [address]   Sodium 01/16/2024 136  135 - 145 mmol/L Final   Potassium 01/16/2024 4.0  3.5 - 5.1 mmol/L Final   Chloride 01/16/2024 100  98 - 111 mmol/L Final   CO2 01/16/2024 27  22 - 32 mmol/L Final   Glucose, Bld 01/16/2024 99  70 - 99 mg/dL Final   Glucose reference range applies only to  samples taken after fasting for at least 8 hours.   BUN 01/16/2024 5 (L)  6 - 20 mg/dL Final   Creatinine, Ser 01/16/2024 0.64  0.61 - 1.24 mg/dL Final   Calcium 60/45/4098 9.2  8.9 - 10.3 mg/dL Final   GFR, Estimated 01/16/2024 >60  >60 mL/min Final   Comment: (NOTE) Calculated using the CKD-EPI Creatinine Equation (2021)    Anion gap 01/16/2024 9  5 - 15 Final   Performed at [facility], [address] [place], [place] [address]  Admission on 01/12/2024, Discharged on 01/14/2024  Component Date Value Ref Range Status   Sodium 01/12/2024 134 (L)  135 - 145 mmol/L Final   Potassium 01/12/2024 2.9 (L)  3.5 - 5.1 mmol/L Final   Chloride 01/12/2024 96 (L)  98 - 111 mmol/L Final   CO2 01/12/2024 26  22 - 32 mmol/L Final   Glucose, Bld 01/12/2024 171 (H)  70 - 99 mg/dL Final   Glucose reference range applies only to samples taken after fasting for at least 8 hours.   BUN 01/12/2024 8  6 - 20 mg/dL Final   Creatinine, Ser 01/12/2024 0.60 (L)  0.61 - 1.24 mg/dL Final   Calcium 78/29/5621 8.5 (L)  8.9 -  10.3 mg/dL Final   Total Protein 16/07/9603 7.5  6.5 - 8.1 g/dL Final   Albumin 54/06/8118 2.8 (L)  3.5 - 5.0 g/dL Final   AST 14/78/2956 49 (H)  15 - 41 U/L Final   ALT 01/12/2024 48 (H)  0 - 44 U/L Final   Alkaline Phosphatase 01/12/2024 112  38 - 126 U/L Final   Total Bilirubin 01/12/2024 0.5  0.0 - 1.2 mg/dL Final   GFR, Estimated 01/12/2024 >60  >60 mL/min Final   Comment: (NOTE) Calculated using the CKD-EPI Creatinine Equation (2021)    Anion gap 01/12/2024 12  5 - 15 Final   Performed at [facility], [address], [place], [place] [address]   Alcohol, Ethyl (B) 01/12/2024 194 (H)  <10 mg/dL Final   Comment: (NOTE) Lowest detectable limit for serum alcohol is 10 mg/dL.  For medical purposes only. Performed at [facility], [address] [place], [place] [address]    Opiates 01/12/2024 POSITIVE (A)  NONE DETECTED Final   Cocaine  01/12/2024 POSITIVE (A)  NONE DETECTED Final   Benzodiazepines 01/12/2024 NONE DETECTED  NONE DETECTED Final   Amphetamines 01/12/2024 NONE DETECTED  NONE DETECTED Final   Tetrahydrocannabinol 01/12/2024 POSITIVE (A)  NONE DETECTED Final   Barbiturates 01/12/2024 NONE DETECTED  NONE DETECTED Final   Comment: (NOTE) DRUG SCREEN FOR MEDICAL PURPOSES ONLY.  IF CONFIRMATION IS NEEDED FOR ANY PURPOSE, NOTIFY LAB WITHIN 5 DAYS.  LOWEST DETECTABLE LIMITS FOR URINE DRUG SCREEN Drug Class                     Cutoff (ng/mL) Amphetamine and metabolites    1000 Barbiturate and metabolites    200 Benzodiazepine                 200 Opiates and metabolites        300 Cocaine and metabolites        300 THC                            50 Performed at [facility], [address], [place], [place] [address]    WBC 01/12/2024 11.3 (H)  4.0 - 10.5 K/uL Final   RBC 01/12/2024 3.99 (L)  4.22 - 5.81 MIL/uL Final   Hemoglobin 01/12/2024 11.7 (L)  13.0 - 17.0 g/dL Final   HCT 52/84/1324 36.1 (L)  39.0 - 52.0 % Final   MCV 01/12/2024 90.5  80.0 - 100.0 fL Final   MCH 01/12/2024 29.3  26.0 - 34.0 pg Final   MCHC 01/12/2024 32.4  30.0 - 36.0 g/dL Final   RDW 40/07/2724 13.6  11.5 - 15.5 % Final   Platelets 01/12/2024 477 (H)  150 - 400 K/uL Final   nRBC 01/12/2024 0.0  0.0 - 0.2 % Final   Neutrophils Relative % 01/12/2024 76  % Final   Neutro Abs 01/12/2024 8.6 (H)  1.7 - 7.7 K/uL Final   Lymphocytes Relative 01/12/2024 16  % Final   Lymphs Abs 01/12/2024 1.8  0.7 - 4.0 K/uL Final   Monocytes Relative 01/12/2024 6  % Final   Monocytes Absolute 01/12/2024 0.7  0.1 - 1.0 K/uL Final   Eosinophils Relative 01/12/2024 1  % Final   Eosinophils Absolute 01/12/2024 0.1  0.0 - 0.5 K/uL Final   Basophils Relative 01/12/2024 1  % Final   Basophils Absolute 01/12/2024 0.1  0.0 - 0.1  K/uL Final   Immature Granulocytes 01/12/2024 0  % Final   Abs Immature Granulocytes 01/12/2024 0.04  0.00 - 0.07  K/uL Final   Performed at [facility], [address], [place], [place] [address]   Sodium 01/13/2024 135  135 - 145 mmol/L Final   Potassium 01/13/2024 3.4 (L)  3.5 - 5.1 mmol/L Final   Chloride 01/13/2024 100  98 - 111 mmol/L Final   CO2 01/13/2024 28  22 - 32 mmol/L Final   Glucose, Bld 01/13/2024 106 (H)  70 - 99 mg/dL Final   Glucose reference range applies only to samples taken after fasting for at least 8 hours.   BUN 01/13/2024 8  6 - 20 mg/dL Final   Creatinine, Ser 01/13/2024 0.34 (L)  0.61 - 1.24 mg/dL Final   Calcium 60/45/4098 7.9 (L)  8.9 - 10.3 mg/dL Final   Total Protein 11/91/4782 7.1  6.5 - 8.1 g/dL Final   Albumin 95/62/1308 2.6 (L)  3.5 - 5.0 g/dL Final   AST 65/78/4696 41  15 - 41 U/L Final   ALT 01/13/2024 43  0 - 44 U/L Final   Alkaline Phosphatase 01/13/2024 125  38 - 126 U/L Final   Total Bilirubin 01/13/2024 0.3  0.0 - 1.2 mg/dL Final   GFR, Estimated 01/13/2024 >60  >60 mL/min Final   Comment: (NOTE) Calculated using the CKD-EPI Creatinine Equation (2021)    Anion gap 01/13/2024 7  5 - 15 Final   Performed at [facility], [address] [place], [place] [address]  Admission on 01/05/2024, Discharged on 01/06/2024  Component Date Value Ref Range Status   Sodium 01/05/2024 131 (L)  135 - 145 mmol/L Final   Potassium 01/05/2024 3.4 (L)  3.5 - 5.1 mmol/L Final   Chloride 01/05/2024 96 (L)  98 - 111 mmol/L Final   CO2 01/05/2024 26  22 - 32 mmol/L Final   Glucose, Bld 01/05/2024 118 (H)  70 - 99 mg/dL Final   Glucose reference range applies only to samples taken after fasting for at least 8 hours.   BUN 01/05/2024 7  6 - 20 mg/dL Final   Creatinine, Ser 01/05/2024 0.51 (L)  0.61 - 1.24 mg/dL Final   Calcium 41/32/4401 8.6 (L)  8.9 - 10.3 mg/dL Final   GFR, Estimated 01/05/2024 >60  >60 mL/min Final   Comment: (NOTE) Calculated using the CKD-EPI Creatinine Equation (2021)    Anion gap 01/05/2024 9  5 - 15 Final    Performed at [facility], [address] [place], [place] [address]   WBC 01/05/2024 8.9  4.0 - 10.5 K/uL Final   RBC 01/05/2024 3.56 (L)  4.22 - 5.81 MIL/uL Final   Hemoglobin 01/05/2024 10.7 (L)  13.0 - 17.0 g/dL Final   HCT 36/64/4034 32.3 (L)  39.0 - 52.0 % Final   MCV 01/05/2024 90.7  80.0 - 100.0 fL Final   MCH 01/05/2024 30.1  26.0 - 34.0 pg Final   MCHC 01/05/2024 33.1  30.0 - 36.0 g/dL Final   RDW 74/25/9563 13.1  11.5 - 15.5 % Final   Platelets 01/05/2024 349  150 - 400 K/uL Final   nRBC 01/05/2024 0.0  0.0 - 0.2 % Final   Performed at [facility], [address], [place], [place] [address]   Color, Urine 01/06/2024 YELLOW  YELLOW Final   APPearance 01/06/2024 CLEAR  CLEAR Final   Specific Gravity, Urine 01/06/2024 1.013  1.005 -  1.030 Final   pH 01/06/2024 5.0  5.0 - 8.0 Final   Glucose, UA 01/06/2024 NEGATIVE  NEGATIVE mg/dL Final   Hgb urine dipstick 01/06/2024 NEGATIVE  NEGATIVE Final   Bilirubin Urine 01/06/2024 NEGATIVE  NEGATIVE Final   Ketones, ur 01/06/2024 NEGATIVE  NEGATIVE mg/dL Final   Protein, ur 55/73/2202 NEGATIVE  NEGATIVE mg/dL Final   Nitrite 54/27/0623 NEGATIVE  NEGATIVE Final   Leukocytes,Ua 01/06/2024 NEGATIVE  NEGATIVE Final   Performed at [facility], [address], [place], [place] [address]   Glucose-Capillary 01/05/2024 113 (H)  70 - 99 mg/dL Final   Glucose reference range applies only to samples taken after fasting for at least 8 hours.   Total CK 01/05/2024 116  49 - 397 U/L Final   Performed at [facility], [address] [place], [place] [address]  Admission on 12/13/2023, Discharged on 12/15/2023  Component Date Value Ref Range Status   WBC 12/13/2023 10.4  4.0 - 10.5 K/uL Final   RBC 12/13/2023 4.17 (L)  4.22 - 5.81 MIL/uL Final   Hemoglobin 12/13/2023 12.8 (L)  13.0 - 17.0 g/dL Final   HCT 16/04/3709 39.2  39.0 - 52.0 % Final   MCV 12/13/2023 94.0  80.0 -  100.0 fL Final   MCH 12/13/2023 30.7  26.0 - 34.0 pg Final   MCHC 12/13/2023 32.7  30.0 - 36.0 g/dL Final   RDW 62/69/4854 14.2  11.5 - 15.5 % Final   Platelets 12/13/2023 360  150 - 400 K/uL Final   nRBC 12/13/2023 0.0  0.0 - 0.2 % Final   Neutrophils Relative % 12/13/2023 62  % Final   Neutro Abs 12/13/2023 6.4  1.7 - 7.7 K/uL Final   Lymphocytes Relative 12/13/2023 25  % Final   Lymphs Abs 12/13/2023 2.6  0.7 - 4.0 K/uL Final   Monocytes Relative 12/13/2023 11  % Final   Monocytes Absolute 12/13/2023 1.2 (H)  0.1 - 1.0 K/uL Final   Eosinophils Relative 12/13/2023 1  % Final   Eosinophils Absolute 12/13/2023 0.2  0.0 - 0.5 K/uL Final   Basophils Relative 12/13/2023 1  % Final   Basophils Absolute 12/13/2023 0.1  0.0 - 0.1 K/uL Final   Immature Granulocytes 12/13/2023 0  % Final   Abs Immature Granulocytes 12/13/2023 0.04  0.00 - 0.07 K/uL Final   Performed at [facility], [address], [place], [place] [address]   Sodium 12/13/2023 137  135 - 145 mmol/L Final   Potassium 12/13/2023 3.9  3.5 - 5.1 mmol/L Final   Chloride 12/13/2023 104  98 - 111 mmol/L Final   CO2 12/13/2023 23  22 - 32 mmol/L Final   Glucose, Bld 12/13/2023 107 (H)  70 - 99 mg/dL Final   Glucose reference range applies only to samples taken after fasting for at least 8 hours.   BUN 12/13/2023 11  6 - 20 mg/dL Final   Creatinine, Ser 12/13/2023 0.62  0.61 - 1.24 mg/dL Final   Calcium 50/06/3817 8.8 (L)  8.9 - 10.3 mg/dL Final   Total Protein 29/93/7169 8.1  6.5 - 8.1 g/dL Final   Albumin 67/89/3810 2.9 (L)  3.5 - 5.0 g/dL Final   AST 17/51/0258 155 (H)  15 - 41 U/L Final   ALT 12/13/2023 118 (H)  0 - 44 U/L Final   Alkaline Phosphatase 12/13/2023 128 (H)  38 - 126 U/L Final   Total Bilirubin 12/13/2023 0.4  0.0 - 1.2 mg/dL  Final   GFR, Estimated 12/13/2023 >60  >60 mL/min Final   Comment: (NOTE) Calculated using the CKD-EPI Creatinine Equation (2021)    Anion gap 12/13/2023 10  5 - 15  Final   Performed at [facility], [address], [place], [place] [address]   Alcohol, Ethyl (B) 12/13/2023 204 (H)  <10 mg/dL Final   Comment: (NOTE) Lowest detectable limit for serum alcohol is 10 mg/dL.  For medical purposes only. Performed at [facility], [address], [place], [place] [address]    Salicylate Lvl 12/13/2023 <7.0 (L)  7.0 - 30.0 mg/dL Final   Performed at [facility], [address], [place], [place] [address]   Acetaminophen (Tylenol), Serum 12/13/2023 11  10 - 30 ug/mL Final   Comment: (NOTE) Therapeutic concentrations vary significantly. A range of 10-30 ug/mL  may be an effective concentration for many patients. However, some  are best treated at concentrations outside of this range. Acetaminophen concentrations >150 ug/mL at 4 hours after ingestion  and >50 ug/mL at 12 hours after ingestion are often associated with  toxic reactions.  Performed at [facility], [address], [place], [place] [address]    Opiates 12/13/2023 POSITIVE (A)  NONE DETECTED Final   Cocaine 12/13/2023 POSITIVE (A)  NONE DETECTED Final   Benzodiazepines 12/13/2023 POSITIVE (A)  NONE DETECTED Final   Amphetamines 12/13/2023 NONE DETECTED  NONE DETECTED Final   Tetrahydrocannabinol 12/13/2023 POSITIVE (A)  NONE DETECTED Final   Barbiturates 12/13/2023 NONE DETECTED  NONE DETECTED Final   Comment: (NOTE) DRUG SCREEN FOR MEDICAL PURPOSES ONLY.  IF CONFIRMATION IS NEEDED FOR ANY PURPOSE, NOTIFY LAB WITHIN 5 DAYS.  LOWEST DETECTABLE LIMITS FOR URINE DRUG SCREEN Drug Class                     Cutoff (ng/mL) Amphetamine and metabolites    1000 Barbiturate and metabolites    200 Benzodiazepine                 200 Opiates and metabolites        300 Cocaine and metabolites        300 THC                            50 Performed at [facility], [address] [place], [place]  [address]   Admission on 12/08/2023, Discharged on 12/12/2023  Component Date Value Ref Range Status   Opiates 12/08/2023 NONE DETECTED  NONE DETECTED Final   Cocaine 12/08/2023 NONE DETECTED  NONE DETECTED Final   Benzodiazepines 12/08/2023 POSITIVE (A)  NONE DETECTED Final   Amphetamines 12/08/2023 NONE DETECTED  NONE DETECTED Final   Tetrahydrocannabinol 12/08/2023 POSITIVE (A)  NONE DETECTED Final   Barbiturates 12/08/2023 NONE DETECTED  NONE DETECTED Final   Comment: (NOTE) DRUG SCREEN FOR MEDICAL PURPOSES ONLY.  IF CONFIRMATION IS NEEDED FOR ANY PURPOSE, NOTIFY LAB WITHIN 5 DAYS.  LOWEST DETECTABLE LIMITS FOR URINE DRUG SCREEN Drug Class                     Cutoff (ng/mL) Amphetamine and metabolites    1000 Barbiturate and metabolites    200 Benzodiazepine                 200 Opiates and metabolites        300 Cocaine and metabolites  300 THC                            50 Performed at [facility], [address] [place], [place] [address]   Admission on 12/06/2023, Discharged on 12/08/2023  Component Date Value Ref Range Status   Alcohol, Ethyl (B) 12/06/2023 <10  <10 mg/dL Final   Comment: (NOTE) Lowest detectable limit for serum alcohol is 10 mg/dL.  For medical purposes only. Performed at [facility], [address] [place], [place] [address]    POC Amphetamine UR 12/06/2023 None Detected  NONE DETECTED (Cut Off Level 1000 ng/mL) Final   POC Secobarbital (BAR) 12/06/2023 None Detected  NONE DETECTED (Cut Off Level 300 ng/mL) Final   POC Buprenorphine (BUP) 12/06/2023 None Detected  NONE DETECTED (Cut Off Level 10 ng/mL) Final   POC Oxazepam (BZO) 12/06/2023 Positive (A)  NONE DETECTED (Cut Off Level 300 ng/mL) Final   POC Cocaine UR 12/06/2023 Positive (A)  NONE DETECTED (Cut Off Level 300 ng/mL) Final   POC Methamphetamine UR 12/06/2023 None Detected  NONE DETECTED (Cut Off Level 1000 ng/mL) Final   POC Morphine 12/06/2023 None Detected  NONE  DETECTED (Cut Off Level 300 ng/mL) Final   POC Methadone UR 12/06/2023 None Detected  NONE DETECTED (Cut Off Level 300 ng/mL) Final   POC Oxycodone UR 12/06/2023 None Detected  NONE DETECTED (Cut Off Level 100 ng/mL) Final   POC Marijuana UR 12/06/2023 Positive (A)  NONE DETECTED (Cut Off Level 50 ng/mL) Final   WBC 12/08/2023 4.4  4.0 - 10.5 K/uL Final   RBC 12/08/2023 3.53 (L)  4.22 - 5.81 MIL/uL Final   Hemoglobin 12/08/2023 11.2 (L)  13.0 - 17.0 g/dL Final   HCT 82/95/6213 32.6 (L)  39.0 - 52.0 % Final   MCV 12/08/2023 92.4  80.0 - 100.0 fL Final   MCH 12/08/2023 31.7  26.0 - 34.0 pg Final   MCHC 12/08/2023 34.4  30.0 - 36.0 g/dL Final   RDW 08/65/7846 13.9  11.5 - 15.5 % Final   Platelets 12/08/2023 280  150 - 400 K/uL Final   nRBC 12/08/2023 0.0  0.0 - 0.2 % Final   Neutrophils Relative % 12/08/2023 59  % Final   Neutro Abs 12/08/2023 2.6  1.7 - 7.7 K/uL Final   Lymphocytes Relative 12/08/2023 27  % Final   Lymphs Abs 12/08/2023 1.2  0.7 - 4.0 K/uL Final   Monocytes Relative 12/08/2023 9  % Final   Monocytes Absolute 12/08/2023 0.4  0.1 - 1.0 K/uL Final   Eosinophils Relative 12/08/2023 3  % Final   Eosinophils Absolute 12/08/2023 0.1  0.0 - 0.5 K/uL Final   Basophils Relative 12/08/2023 1  % Final   Basophils Absolute 12/08/2023 0.1  0.0 - 0.1 K/uL Final   Immature Granulocytes 12/08/2023 1  % Final   Abs Immature Granulocytes 12/08/2023 0.02  0.00 - 0.07 K/uL Final   Performed at [facility], [address], [place], [place] [address]   Sodium 12/08/2023 139  135 - 145 mmol/L Final   Potassium 12/08/2023 4.0  3.5 - 5.1 mmol/L Final   Chloride 12/08/2023 100  98 - 111 mmol/L Final   CO2 12/08/2023 27  22 - 32 mmol/L Final   Glucose, Bld 12/08/2023 114 (H)  70 - 99 mg/dL Final   Glucose reference range applies only to samples taken after fasting for at least 8  hours.   BUN 12/08/2023 9  6 - 20 mg/dL Final   Creatinine, Ser 12/08/2023 0.60 (L)  0.61 - 1.24 mg/dL Final    Calcium 96/01/5408 8.7 (L)  8.9 - 10.3 mg/dL Final   Total Protein 81/19/1478 6.2 (L)  6.5 - 8.1 g/dL Final   Albumin 29/56/2130 2.3 (L)  3.5 - 5.0 g/dL Final   AST 86/57/8469 101 (H)  15 - 41 U/L Final   ALT 12/08/2023 55 (H)  0 - 44 U/L Final   Alkaline Phosphatase 12/08/2023 125  38 - 126 U/L Final   Total Bilirubin 12/08/2023 0.4  0.0 - 1.2 mg/dL Final   GFR, Estimated 12/08/2023 >60  >60 mL/min Final   Comment: (NOTE) Calculated using the CKD-EPI Creatinine Equation (2021)    Anion gap 12/08/2023 12  5 - 15 Final   Performed at [facility], [address], [place], [place] [address]   Cholesterol 12/08/2023 96  0 - 200 mg/dL Final   Triglycerides 84/13/2440 55  <150 mg/dL Final   HDL 07/30/2535 34 (L)  >40 mg/dL Final   Total CHOL/HDL Ratio 12/08/2023 2.8  RATIO Final   VLDL 12/08/2023 11  0 - 40 mg/dL Final   LDL Cholesterol 12/08/2023 51  0 - 99 mg/dL Final   Comment:        Total Cholesterol/HDL:CHD Risk Coronary Heart Disease Risk Table                     Men   Women  1/2 Average Risk   3.4   3.3  Average Risk       5.0   4.4  2 X Average Risk   9.6   7.1  3 X Average Risk  23.4   11.0        Use the calculated Patient Ratio above and the CHD Risk Table to determine the patient's CHD Risk.        ATP III CLASSIFICATION (LDL):  <100     mg/dL   Optimal  644-034  mg/dL   Near or Above                    Optimal  130-159  mg/dL   Borderline  742-595  mg/dL   High  >638     mg/dL   Very High Performed at [facility], [address], [place], [place] [address]    Magnesium 12/08/2023 1.7  1.7 - 2.4 mg/dL Final   Performed at [facility], [address] [place], [place] [address]   TSH 12/08/2023 0.714  0.350 - 4.500 uIU/mL Final   Comment: Performed by a 3rd Generation assay with a functional sensitivity of <=0.01 uIU/mL. Performed at [facility], [address], [place], [place] [address]    Hgb A1c MFr Bld 12/08/2023 6.0 (H)  4.8 -  5.6 % Final   Comment: (NOTE) Pre diabetes:          5.7%-6.4%  Diabetes:              >6.4%  Glycemic control for   <7.0% adults with diabetes    Mean Plasma Glucose 12/08/2023 125.5  mg/dL Final   Performed at [facility], [address], [place], [place] [address]  Admission on 12/04/2023, Discharged on 12/04/2023  Component Date Value Ref Range Status   Color, Urine 12/04/2023 YELLOW  YELLOW Final   APPearance 12/04/2023 CLEAR  CLEAR Final   Specific Gravity, Urine 12/04/2023 1.029  1.005 - 1.030 Final   pH 12/04/2023 5.0  5.0 - 8.0 Final   Glucose, UA 12/04/2023 NEGATIVE  NEGATIVE mg/dL Final   Hgb urine dipstick 12/04/2023 NEGATIVE  NEGATIVE Final   Bilirubin Urine 12/04/2023 NEGATIVE  NEGATIVE Final   Ketones, ur 12/04/2023 NEGATIVE  NEGATIVE mg/dL Final   Protein, ur 16/07/9603 NEGATIVE  NEGATIVE mg/dL Final   Nitrite 54/06/8118 NEGATIVE  NEGATIVE Final   Leukocytes,Ua 12/04/2023 NEGATIVE  NEGATIVE Final   Performed at [facility], [address] [place], [place] [address]  Admission on 11/27/2023, Discharged on 11/28/2023  Component Date Value Ref Range Status   WBC 11/27/2023 13.5 (H)  4.0 - 10.5 K/uL Final   RBC 11/27/2023 4.12 (L)  4.22 - 5.81 MIL/uL Final   Hemoglobin 11/27/2023 12.8 (L)  13.0 - 17.0 g/dL Final   HCT 95/62/1308 38.4 (L)  39.0 - 52.0 % Final   MCV 11/27/2023 93.2  80.0 - 100.0 fL Final   MCH 11/27/2023 31.1  26.0 - 34.0 pg Final   MCHC 11/27/2023 33.3  30.0 - 36.0 g/dL Final   RDW 65/78/4696 13.4  11.5 - 15.5 % Final   Platelets 11/27/2023 375  150 - 400 K/uL Final   nRBC 11/27/2023 0.0  0.0 - 0.2 % Final   Performed at [facility], [address], [place], [place] [address]   Sodium 11/27/2023 132 (L)  135 - 145 mmol/L Final   Potassium 11/27/2023 4.3  3.5 - 5.1 mmol/L Final   Chloride 11/27/2023 95 (L)  98 - 111 mmol/L Final   CO2 11/27/2023 21 (L)  22 - 32 mmol/L Final   Glucose, Bld 11/27/2023 113 (H)  70 -  99 mg/dL Final   Glucose reference range applies only to samples taken after fasting for at least 8 hours.   BUN 11/27/2023 15  6 - 20 mg/dL Final   Creatinine, Ser 11/27/2023 0.86  0.61 - 1.24 mg/dL Final   Calcium 41/32/4401 9.5  8.9 - 10.3 mg/dL Final   GFR, Estimated 11/27/2023 >60  >60 mL/min Final   Comment: (NOTE) Calculated using the CKD-EPI Creatinine Equation (2021)    Anion gap 11/27/2023 16 (H)  5 - 15 Final   Performed at [facility], [address], [place], [place] [address]   Salicylate Lvl 11/27/2023 <7.0 (L)  7.0 - 30.0 mg/dL Final   Performed at [facility], [address], [place], [place] [address]   Acetaminophen (Tylenol), Serum 11/27/2023 10  10 - 30 ug/mL Final   Comment: (NOTE) Therapeutic concentrations vary significantly. A range of 10-30 ug/mL  may be an effective concentration for many patients. However, some  are best treated at concentrations outside of this range. Acetaminophen concentrations >150 ug/mL at 4 hours after ingestion  and >50 ug/mL at 12 hours after ingestion are often associated with  toxic reactions.  Performed at [facility], [address] [place], [place] [address]    Alcohol, Ethyl (B) 11/27/2023 127 (H)  <10 mg/dL Final   Comment: (NOTE) Lowest detectable limit for serum alcohol is 10 mg/dL.  For medical purposes only. Performed at [facility], [address] [place], [place] [address]    Opiates 11/27/2023 POSITIVE (A)  NONE DETECTED Final   Cocaine 11/27/2023 POSITIVE (A)  NONE DETECTED Final   Benzodiazepines 11/27/2023 POSITIVE (A)  NONE DETECTED Final   Amphetamines  11/27/2023 NONE DETECTED  NONE DETECTED Final   Tetrahydrocannabinol 11/27/2023 POSITIVE (A)  NONE DETECTED Final   Barbiturates 11/27/2023 NONE DETECTED  NONE DETECTED Final   Comment: (NOTE) DRUG SCREEN FOR MEDICAL PURPOSES ONLY.  IF CONFIRMATION IS NEEDED FOR ANY PURPOSE, NOTIFY LAB WITHIN 5 DAYS.  LOWEST DETECTABLE  LIMITS FOR URINE DRUG SCREEN Drug Class                     Cutoff (ng/mL) Amphetamine and metabolites    1000 Barbiturate and metabolites    200 Benzodiazepine                 200 Opiates and metabolites        300 Cocaine and metabolites        300 THC                            50 Performed at [facility], [address] [place], [place] [address]    Color, Urine 11/27/2023 AMBER (A)  YELLOW Final   BIOCHEMICALS MAY BE AFFECTED BY COLOR   APPearance 11/27/2023 CLEAR  CLEAR Final   Specific Gravity, Urine 11/27/2023 1.020  1.005 - 1.030 Final   pH 11/27/2023 5.0  5.0 - 8.0 Final   Glucose, UA 11/27/2023 NEGATIVE  NEGATIVE mg/dL Final   Hgb urine dipstick 11/27/2023 NEGATIVE  NEGATIVE Final   Bilirubin Urine 11/27/2023 NEGATIVE  NEGATIVE Final   Ketones, ur 11/27/2023 NEGATIVE  NEGATIVE mg/dL Final   Protein, ur 60/45/4098 NEGATIVE  NEGATIVE mg/dL Final   Nitrite 11/91/4782 NEGATIVE  NEGATIVE Final   Leukocytes,Ua 11/27/2023 NEGATIVE  NEGATIVE Final   Performed at [facility], [address] [place], [place] [address]   Neisseria Gonorrhea 11/27/2023 Negative   Final   Chlamydia 11/27/2023 Negative   Final   Comment 11/27/2023 Normal Reference Ranger Chlamydia - Negative   Final   Comment 11/27/2023 Normal Reference Range Neisseria Gonorrhea - Negative   Final  Admission on 11/21/2023, Discharged on 11/27/2023  Component Date Value Ref Range Status   SARS Coronavirus 2 by RT PCR 11/25/2023 NEGATIVE  NEGATIVE Final   Performed at [facility], [address] [place], [place] [address]   WBC 11/21/2023 5.6  4.0 - 10.5 K/uL Final   RBC 11/21/2023 4.14 (L)  4.22 - 5.81 MIL/uL Final   Hemoglobin 11/21/2023 13.1  13.0 - 17.0 g/dL Final   HCT 78/46/9629 38.8 (L)  39.0 - 52.0 % Final   MCV 11/21/2023 93.7  80.0 - 100.0 fL Final   MCH 11/21/2023 31.6  26.0 - 34.0 pg Final   MCHC 11/21/2023 33.8  30.0 - 36.0 g/dL Final   RDW 52/84/1324 13.2  11.5 - 15.5 % Final    Platelets 11/21/2023 298  150 - 400 K/uL Final   nRBC 11/21/2023 0.0  0.0 - 0.2 % Final   Neutrophils Relative % 11/21/2023 83  % Final   Neutro Abs 11/21/2023 4.6  1.7 - 7.7 K/uL Final   Lymphocytes Relative 11/21/2023 14  % Final   Lymphs Abs 11/21/2023 0.8  0.7 - 4.0 K/uL Final   Monocytes Relative 11/21/2023 3  % Final   Monocytes Absolute 11/21/2023 0.2  0.1 - 1.0 K/uL Final   Eosinophils Relative 11/21/2023 0  % Final   Eosinophils Absolute 11/21/2023 0.0  0.0 - 0.5 K/uL Final   Basophils Relative 11/21/2023 0  %  Final   Basophils Absolute 11/21/2023 0.0  0.0 - 0.1 K/uL Final   Immature Granulocytes 11/21/2023 0  % Final   Abs Immature Granulocytes 11/21/2023 0.01  0.00 - 0.07 K/uL Final   Performed at [facility], [address], [place], [place] [address]   Sodium 11/21/2023 135  135 - 145 mmol/L Final   Potassium 11/21/2023 4.2  3.5 - 5.1 mmol/L Final   Chloride 11/21/2023 99  98 - 111 mmol/L Final   CO2 11/21/2023 26  22 - 32 mmol/L Final   Glucose, Bld 11/21/2023 162 (H)  70 - 99 mg/dL Final   Glucose reference range applies only to samples taken after fasting for at least 8 hours.   BUN 11/21/2023 7  6 - 20 mg/dL Final   Creatinine, Ser 11/21/2023 0.63  0.61 - 1.24 mg/dL Final   Calcium 60/45/4098 8.9  8.9 - 10.3 mg/dL Final   Total Protein 11/91/4782 8.1  6.5 - 8.1 g/dL Final   Albumin 95/62/1308 2.8 (L)  3.5 - 5.0 g/dL Final   AST 65/78/4696 91 (H)  15 - 41 U/L Final   ALT 11/21/2023 55 (H)  0 - 44 U/L Final   Alkaline Phosphatase 11/21/2023 87  38 - 126 U/L Final   Total Bilirubin 11/21/2023 0.8  0.0 - 1.2 mg/dL Final   GFR, Estimated 11/21/2023 >60  >60 mL/min Final   Comment: (NOTE) Calculated using the CKD-EPI Creatinine Equation (2021)    Anion gap 11/21/2023 10  5 - 15 Final   Performed at [facility], [address] [place], [place] [address]   Alcohol, Ethyl (B) 11/21/2023 <10  <10 mg/dL Final   Comment: (NOTE) Lowest detectable limit for  serum alcohol is 10 mg/dL.  For medical purposes only. Performed at [facility], [address] [place], [place] [address]    TSH 11/21/2023 0.365  0.350 - 4.500 uIU/mL Final   Comment: Performed by a 3rd Generation assay with a functional sensitivity of <=0.01 uIU/mL. Performed at [facility], [address], [place], [place] [address]    POC Amphetamine UR 11/22/2023 None Detected  NONE DETECTED (Cut Off Level 1000 ng/mL) Final   POC Secobarbital (BAR) 11/22/2023 None Detected  NONE DETECTED (Cut Off Level 300 ng/mL) Final   POC Buprenorphine (BUP) 11/22/2023 None Detected  NONE DETECTED (Cut Off Level 10 ng/mL) Final   POC Oxazepam (BZO) 11/22/2023 Positive (A)  NONE DETECTED (Cut Off Level 300 ng/mL) Final   POC Cocaine UR 11/22/2023 Positive (A)  NONE DETECTED (Cut Off Level 300 ng/mL) Final   POC Methamphetamine UR 11/22/2023 None Detected  NONE DETECTED (Cut Off Level 1000 ng/mL) Final   POC Morphine 11/22/2023 None Detected  NONE DETECTED (Cut Off Level 300 ng/mL) Final   POC Methadone UR 11/22/2023 None Detected  NONE DETECTED (Cut Off Level 300 ng/mL) Final   POC Oxycodone UR 11/22/2023 None Detected  NONE DETECTED (Cut Off Level 100 ng/mL) Final   POC Marijuana UR 11/22/2023 Positive (A)  NONE DETECTED (Cut Off Level 50 ng/mL) Final   Hgb A1c MFr Bld 11/25/2023 5.9 (H)  4.8 - 5.6 % Final   Comment: (NOTE) Pre diabetes:          5.7%-6.4%  Diabetes:              >6.4%  Glycemic control for   <7.0% adults with diabetes    Mean Plasma Glucose 11/25/2023 122.63  mg/dL Final  Performed at [facility], [address], [place], [place] [address]   Sodium 11/25/2023 136  135 - 145 mmol/L Final   Potassium 11/25/2023 4.2  3.5 - 5.1 mmol/L Final   Chloride 11/25/2023 101  98 - 111 mmol/L Final   CO2 11/25/2023 27  22 - 32 mmol/L Final   Glucose, Bld 11/25/2023 108 (H)  70 - 99 mg/dL Final   Glucose reference range applies only to samples taken after fasting  for at least 8 hours.   BUN 11/25/2023 9  6 - 20 mg/dL Final   Creatinine, Ser 11/25/2023 0.68  0.61 - 1.24 mg/dL Final   Calcium 81/19/1478 8.6 (L)  8.9 - 10.3 mg/dL Final   Total Protein 29/56/2130 7.0  6.5 - 8.1 g/dL Final   Albumin 86/57/8469 2.3 (L)  3.5 - 5.0 g/dL Final   AST 62/95/2841 81 (H)  15 - 41 U/L Final   ALT 11/25/2023 58 (H)  0 - 44 U/L Final   Alkaline Phosphatase 11/25/2023 84  38 - 126 U/L Final   Total Bilirubin 11/25/2023 0.7  0.0 - 1.2 mg/dL Final   GFR, Estimated 11/25/2023 >60  >60 mL/min Final   Comment: (NOTE) Calculated using the CKD-EPI Creatinine Equation (2021)    Anion gap 11/25/2023 8  5 - 15 Final   Performed at [facility], [address], [place], [place] [address]   Iron 11/25/2023 21 (L)  45 - 182 ug/dL Final   TIBC 07/30/2535 280  250 - 450 ug/dL Final   Saturation Ratios 11/25/2023 8 (L)  17.9 - 39.5 % Final   UIBC 11/25/2023 259  ug/dL Final   Performed at [facility], [address], [place], [place] [address]   Vitamin B-12 11/25/2023 549  180 - 914 pg/mL Final   Comment: (NOTE) This assay is not validated for testing neonatal or myeloproliferative syndrome specimens for Vitamin B12 levels. Performed at [facility], [address] [place], [place] [address]    Ferritin 11/25/2023 217  24 - 336 ng/mL Final   Performed at [facility], [address], [place], [place] [address]  Admission on 11/19/2023, Discharged on 11/20/2023  Component Date Value Ref Range Status   Sodium 11/19/2023 138  135 - 145 mmol/L Final   Potassium 11/19/2023 3.4 (L)  3.5 - 5.1 mmol/L Final   Chloride 11/19/2023 103  98 - 111 mmol/L Final   CO2 11/19/2023 26  22 - 32 mmol/L Final   Glucose, Bld 11/19/2023 123 (H)  70 - 99 mg/dL Final   Glucose reference range applies only to samples taken after fasting for at least 8 hours.   BUN 11/19/2023 <5 (L)  6 - 20 mg/dL Final   Creatinine, Ser 11/19/2023 0.50 (L)  0.61 - 1.24 mg/dL Final    Calcium 75/64/3329 8.2 (L)  8.9 - 10.3 mg/dL Final   GFR, Estimated 11/19/2023 >60  >60 mL/min Final   Comment: (NOTE) Calculated using the CKD-EPI Creatinine Equation (2021)    Anion gap 11/19/2023 9  5 - 15 Final   Performed at [facility], [address] [place], [place] [address]   WBC 11/19/2023 8.4  4.0 - 10.5 K/uL Final   RBC 11/19/2023 4.00 (L)  4.22 - 5.81 MIL/uL Final   Hemoglobin 11/19/2023 12.6 (L)  13.0 - 17.0 g/dL Final   HCT 16/60/6301 37.7 (L)  39.0 - 52.0 % Final   MCV 11/19/2023  94.3  80.0 - 100.0 fL Final   MCH 11/19/2023 31.5  26.0 - 34.0 pg Final   MCHC 11/19/2023 33.4  30.0 - 36.0 g/dL Final   RDW 16/07/9603 13.2  11.5 - 15.5 % Final   Platelets 11/19/2023 267  150 - 400 K/uL Final   nRBC 11/19/2023 0.0  0.0 - 0.2 % Final   Neutrophils Relative % 11/19/2023 65  % Final   Neutro Abs 11/19/2023 5.5  1.7 - 7.7 K/uL Final   Lymphocytes Relative 11/19/2023 22  % Final   Lymphs Abs 11/19/2023 1.9  0.7 - 4.0 K/uL Final   Monocytes Relative 11/19/2023 11  % Final   Monocytes Absolute 11/19/2023 0.9  0.1 - 1.0 K/uL Final   Eosinophils Relative 11/19/2023 1  % Final   Eosinophils Absolute 11/19/2023 0.1  0.0 - 0.5 K/uL Final   Basophils Relative 11/19/2023 1  % Final   Basophils Absolute 11/19/2023 0.1  0.0 - 0.1 K/uL Final   Immature Granulocytes 11/19/2023 0  % Final   Abs Immature Granulocytes 11/19/2023 0.03  0.00 - 0.07 K/uL Final   Performed at [facility], [address], [place], [place] [address]   Sed Rate 11/19/2023 77 (H)  0 - 16 mm/hr Final   Performed at [facility], [address] [place], [place] [address]   CRP 11/19/2023 4.2 (H)  <1.0 mg/dL Final   Performed at [facility], [address], [place], [place] [address]   Sodium 11/19/2023 141  135 - 145 mmol/L Final   Potassium 11/19/2023 3.5  3.5 - 5.1 mmol/L Final   Chloride 11/19/2023 101  98 - 111 mmol/L Final   BUN 11/19/2023 <3  (L)  6 - 20 mg/dL Final   Creatinine, Ser 11/19/2023 0.70  0.61 - 1.24 mg/dL Final   Glucose, Bld 62/13/0865 126 (H)  70 - 99 mg/dL Final   Glucose reference range applies only to samples taken after fasting for at least 8 hours.   Calcium, Ion 11/19/2023 1.05 (L)  1.15 - 1.40 mmol/L Final   TCO2 11/19/2023 27  22 - 32 mmol/L Final   Hemoglobin 11/19/2023 12.9 (L)  13.0 - 17.0 g/dL Final   HCT 78/46/9629 38.0 (L)  39.0 - 52.0 % Final   SARS Coronavirus 2 by RT PCR 11/19/2023 NEGATIVE  NEGATIVE Final   Comment: (NOTE) SARS-CoV-2 target nucleic acids are NOT DETECTED.  The SARS-CoV-2 RNA is generally detectable in upper respiratory specimens during the acute phase of infection. The lowest concentration of SARS-CoV-2 viral copies this assay can detect is 138 copies/mL. A negative result does not preclude SARS-Cov-2 infection and should not be used as the sole basis for treatment or other patient management decisions. A negative result may occur with  improper specimen collection/handling, submission of specimen other than nasopharyngeal swab, presence of viral mutation(s) within the areas targeted by this assay, and inadequate number of viral copies(<138 copies/mL). A negative result must be combined with clinical observations, patient history, and epidemiological information. The expected result is Negative.  Fact Sheet for Patients:  BloggerCourse.com  Fact Sheet for Healthcare Providers:  SeriousBroker.it  This test is no                          t yet approved or cleared by the [place] FDA and  has been authorized for detection and/or diagnosis of SARS-CoV-2 by FDA under an Emergency Use Authorization (  EUA). This EUA will remain  in effect (meaning this test can be used) for the duration of the COVID-19 declaration under Section 564(b)(1) of the Act, 21 U.S.C.section 360bbb-3(b)(1), unless the authorization is terminated   or revoked sooner.       Influenza A by PCR 11/19/2023 NEGATIVE  NEGATIVE Final   Influenza B by PCR 11/19/2023 NEGATIVE  NEGATIVE Final   Comment: (NOTE) The Xpert Xpress SARS-CoV-2/FLU/RSV plus assay is intended as an aid in the diagnosis of influenza from Nasopharyngeal swab specimens and should not be used as a sole basis for treatment. Nasal washings and aspirates are unacceptable for Xpert Xpress SARS-CoV-2/FLU/RSV testing.  Fact Sheet for Patients: BloggerCourse.com  Fact Sheet for Healthcare Providers: SeriousBroker.it  This test is not yet approved or cleared by the [place]  FDA and has been authorized for detection and/or diagnosis of SARS-CoV-2 by FDA under an Emergency Use Authorization (EUA). This EUA will remain in effect (meaning this test can be used) for the duration of the COVID-19 declaration under Section 564(b)(1) of the Act, 21 U.S.C. section 360bbb-3(b)(1), unless the authorization is terminated or revoked.     Resp Syncytial Virus by PCR 11/19/2023 NEGATIVE  NEGATIVE Final   Comment: (NOTE) Fact Sheet for Patients: BloggerCourse.com  Fact Sheet for Healthcare Providers: SeriousBroker.it  This test is not yet approved or cleared by the [place]  FDA and has been authorized for detection and/or diagnosis of SARS-CoV-2 by FDA under an Emergency Use Authorization (EUA). This EUA will remain in effect (meaning this test can be used) for the duration of the COVID-19 declaration under Section 564(b)(1) of the Act, 21 U.S.C. section 360bbb-3(b)(1), unless the authorization is terminated or revoked.  Performed at [facility], [address] [place], [place] [address]   There may be more visits with results that are not included.    Allergies: Codeine  Medications:  Facility Ordered Medications  Medication   [COMPLETED]  thiamine (VITAMIN B1) tablet 100 mg   LORazepam (ATIVAN) tablet 1 mg   loperamide (IMODIUM) capsule 2-4 mg   multivitamin with minerals tablet 1 tablet   ondansetron (ZOFRAN-ODT) disintegrating tablet 4 mg   thiamine (VITAMIN B1) tablet 100 mg   acetaminophen (TYLENOL) tablet 650 mg   alum & mag hydroxide-simeth (MAALOX/MYLANTA) 200-200-20 MG/5ML suspension 30 mL   magnesium hydroxide (MILK OF MAGNESIA) suspension 30 mL   haloperidol (HALDOL) tablet 5 mg   And   diphenhydrAMINE (BENADRYL) capsule 50 mg   haloperidol lactate (HALDOL) injection 5 mg   And   diphenhydrAMINE (BENADRYL) injection 50 mg   And   LORazepam (ATIVAN) injection 2 mg   haloperidol lactate (HALDOL) injection 10 mg   And   diphenhydrAMINE (BENADRYL) injection 50 mg   And   LORazepam (ATIVAN) injection 2 mg   hydrOXYzine (ATARAX) tablet 25 mg   diphenhydrAMINE-zinc acetate (BENADRYL) 2-0.1 % cream   gabapentin (NEURONTIN) capsule 300 mg   nicotine (NICODERM CQ - dosed in mg/24 hours) patch 14 mg   [COMPLETED] potassium chloride SA (KLOR-CON M) CR tablet 40 mEq   ferrous sulfate tablet 325 mg   traZODone (DESYREL) tablet 50 mg   dicyclomine (BENTYL) tablet 20 mg   naproxen (NAPROSYN) tablet 500 mg   cyclobenzaprine (FLEXERIL) tablet 10 mg   PTA Medications  Medication Sig   hydrOXYzine (ATARAX) 25 MG tablet Take 1 tablet (25 mg total) by mouth every 6 (six) hours as needed for anxiety.   QUEtiapine (SEROQUEL) 100 MG tablet  Take 1 tablet (100 mg total) by mouth at bedtime. (Patient taking differently: Take 100 mg by mouth at bedtime as needed (for sleep).)   thiamine (VITAMIN B-1) 100 MG tablet Take 1 tablet (100 mg total) by mouth daily. (Patient not taking: Reported on 01/13/2024)   Multiple Vitamin (MULTIVITAMIN WITH MINERALS) TABS tablet Take 1 tablet by mouth daily. (Patient not taking: Reported on 01/13/2024)   nicotine polacrilex (NICORETTE) 2 MG gum Take 1 each (2 mg total) by mouth as needed for smoking  cessation. (Patient not taking: Reported on 01/13/2024)   gabapentin (NEURONTIN) 300 MG capsule Take 2 capsules (600 mg total) by mouth 3 (three) times daily. (Patient taking differently: Take 300-600 mg by mouth 2 (two) times daily.)   methylPREDNISolone (MEDROL DOSEPAK) 4 MG TBPK tablet Take as directed on packet (Patient taking differently: Take 4-24 mg by mouth See admin instructions. Take 24 mg by mouth with food on day one, then decrease by 4 mg once daily until finished)   predniSONE (DELTASONE) 20 MG tablet Take 2 tablets (40 mg total) by mouth daily. (Patient not taking: Reported on 01/13/2024)   acetaminophen (TYLENOL) 500 MG tablet Take 1 tablet (500 mg total) by mouth every 6 (six) hours as needed. (Patient taking differently: Take 500 mg by mouth every 6 (six) hours as needed for mild pain (pain score 1-3) or headache.)   Nicotine (NICODERM CQ TD) Place 1 patch onto the skin daily as needed (for smoking cessation while hospitalized).   traZODone (DESYREL) 50 MG tablet Take 50 mg by mouth at bedtime as needed for sleep.    Long Term Goals: Improvement in symptoms so as ready for discharge  Short Term Goals: Patient will verbalize feelings in meetings with treatment team members., Patient will attend at least of 50% of the groups daily., and Pt will complete the PHQ9 on admission, day 3 and discharge.  Medical Decision Making  Pt is appropriate for treatment at [facility] for detox.  UDS + cocaine, benzodiazepines and THC.    Stimulant Use Disorder Cannabis Use Disorder Nicotine Use Disorder Opiate abuse -COWS monitoring- most recent score is 1 -CIWA with Ativan PRN-most recent score is 0 -MVI/Thiamine  Iron Deficiency anemia -ferrous sulfate 325 mg daily -monitor for constipation  Leg Pain -gabapentin 300 mg bid -flexeril 10 mg tid prn  Dispo: home on 4/16 with CDIOP appointment   Joice Nares, MD 01/16/24  9:40 AM

## 2024-01-16 NOTE — ED Notes (Signed)
 Patient did not want to eat dinner.  His pulse was elevated at 117 Dr. Genita Keys aware.  No new orders.  Patient stable.  NAD.

## 2024-01-16 NOTE — ED Notes (Signed)
 Pt is in his room resting in bed. Looking forward to his discharge tomorrow to go make arrangements to go see a neurologist. Pt denies SI/HI/AVH. No acute distress noted. Will continue to monitor for safety.

## 2024-01-17 ENCOUNTER — Other Ambulatory Visit (HOSPITAL_COMMUNITY): Payer: Self-pay

## 2024-01-17 DIAGNOSIS — E876 Hypokalemia: Secondary | ICD-10-CM | POA: Diagnosis not present

## 2024-01-17 DIAGNOSIS — F1010 Alcohol abuse, uncomplicated: Secondary | ICD-10-CM | POA: Diagnosis not present

## 2024-01-17 DIAGNOSIS — F1110 Opioid abuse, uncomplicated: Secondary | ICD-10-CM | POA: Diagnosis not present

## 2024-01-17 MED ORDER — GABAPENTIN 300 MG PO CAPS: 300.0000 mg | ORAL_CAPSULE | Freq: Two times a day (BID) | ORAL | 0 refills | Status: DC

## 2024-01-17 MED ORDER — NICOTINE 14 MG/24HR TD PT24: 14.0000 mg | MEDICATED_PATCH | Freq: Every day | TRANSDERMAL | 0 refills | Status: DC

## 2024-01-17 MED ORDER — FERROUS SULFATE 325 (65 FE) MG PO TABS: 325.0000 mg | ORAL_TABLET | Freq: Every day | ORAL | 0 refills | Status: DC

## 2024-01-17 MED ORDER — TRAZODONE HCL 50 MG PO TABS: 50.0000 mg | ORAL_TABLET | Freq: Every evening | ORAL | 0 refills | Status: DC | PRN

## 2024-01-17 NOTE — ED Notes (Signed)
 Patient discharged with CDIOP per MD order. After Visit Summary (AVS) printed and given to patient, as well as printed prescriptions. AVS reviewed with patient and all questions fully answered. Patient discharged in no acute distress, A& O x4 and ambulatory. Patient denied SI/HI, A/VH upon discharge. Patient verbalized understanding of all discharge instructions explained by staff, including follow up appointments, RX's and safety plan. Patient mood fair. Patient belongings returned to patient from locker #30 complete and intact. Patient escorted to lobby via staff for transport to destination. Safety maintained.

## 2024-01-17 NOTE — ED Notes (Addendum)
 BP is 90/52. will encourage fluids. Gatorade administered. NP Chinwedu notified. Recheck in 1 hour.

## 2024-01-17 NOTE — ED Provider Notes (Signed)
 FBC/OBS ASAP Discharge Summary  Date and Time: 01/17/2024 9:23 AM  Name: Mark Hammond  MRN:  [id number]   Discharge Diagnoses:  Final diagnoses:  Hypokalemia  Alcohol abuse  Opiate abuse, continuous (HCC)   Mark Hammond is a 33 yo male with a history of polysubstance use disorder (alcohol, tobacco cocaine, opiates, and cannabis), schizoaffective disorder, self-reported bipolar disorder who initially presented to the [facility] on 01/12/24 voluntarily for complaints of psychosis and requesting detox from ongoing alcohol, cocaine, and cannabis use.   Subjective:   Patient was seen on the unit, no acute distress.  Mood: Good  Sleep: Good  Appetite: Good  Denied active and passive SI, HI, AVH, paranoia  Aware of [facility], 988, 911  Review of Systems  Constitutional:  Negative for chills and fever.  Respiratory:  Negative for shortness of breath.   Cardiovascular:  Negative for chest pain and palpitations.  Gastrointestinal:  Negative for nausea and vomiting.  Musculoskeletal:  Positive for joint pain.  Neurological:  Negative for headaches.     Stay Summary:  Admission date: 01/13/2024 Discharge date: 01/17/2024   Total duration of encounter: 3 days  The patient was evaluated each day by a clinical provider to ascertain response to treatment. Improvement was noted by the patient's report of decreasing symptoms, improved sleep and appetite, affect, medication tolerance, behavior, and participation in unit programming.  Patient was asked each day to complete a self inventory noting mood, mental status, pain, new symptoms, anxiety and concerns.  The patient's medications were managed with the following directions: -continued home gabapentin -start trazodone  Patient responded well to medication and being in a therapeutic and supportive environment. Positive and appropriate behavior was noted and the patient was motivated for recovery. The patient worked closely with the treatment team  and case manager to develop a discharge plan with appropriate goals. Coping skills, problem solving as well as relaxation therapies were also part of the unit programming. Patient has denied SI and HI for over 48 hours.    Total Time spent with patient: 1 hour  Past Psychiatric History: see H&P Past Medical History: see H&P Family History: see H&P Family Psychiatric History: see H&P Social History: see H&P Tobacco Cessation:  A prescription for an FDA-approved tobacco cessation medication provided at discharge  Current Medications:  Current Facility-Administered Medications  Medication Dose Route Frequency Provider Last Rate Last Admin   acetaminophen (TYLENOL) tablet 650 mg  650 mg Oral Q6H PRN Motley-Mangrum, Jadeka A, PMHNP   650 mg at 01/16/24 0257   alum & mag hydroxide-simeth (MAALOX/MYLANTA) 200-200-20 MG/5ML suspension 30 mL  30 mL Oral Q4H PRN Motley-Mangrum, Jadeka A, PMHNP       cyclobenzaprine (FLEXERIL) tablet 10 mg  10 mg Oral TID PRN Kizzie Ide B, MD   10 mg at 01/16/24 2114   dicyclomine (BENTYL) tablet 20 mg  20 mg Oral Q6H PRN Park Pope, MD   20 mg at 01/16/24 2114   haloperidol (HALDOL) tablet 5 mg  5 mg Oral TID PRN Motley-Mangrum, Jadeka A, PMHNP       And   diphenhydrAMINE (BENADRYL) capsule 50 mg  50 mg Oral TID PRN Motley-Mangrum, Jadeka A, PMHNP       haloperidol lactate (HALDOL) injection 5 mg  5 mg Intramuscular TID PRN Motley-Mangrum, Jadeka A, PMHNP       And   diphenhydrAMINE (BENADRYL) injection 50 mg  50 mg Intramuscular TID PRN Motley-Mangrum, Ezra Sites, PMHNP  And   LORazepam (ATIVAN) injection 2 mg  2 mg Intramuscular TID PRN Motley-Mangrum, Jadeka A, PMHNP       haloperidol lactate (HALDOL) injection 10 mg  10 mg Intramuscular TID PRN Motley-Mangrum, Jadeka A, PMHNP       And   diphenhydrAMINE (BENADRYL) injection 50 mg  50 mg Intramuscular TID PRN Motley-Mangrum, Jadeka A, PMHNP       And   LORazepam (ATIVAN) injection 2 mg  2 mg  Intramuscular TID PRN Motley-Mangrum, Jadeka A, PMHNP       diphenhydrAMINE-zinc acetate (BENADRYL) 2-0.1 % cream   Topical BID PRN Ajibola, Ene A, NP   Given at 01/14/24 2006   ferrous sulfate tablet 325 mg  325 mg Oral Q breakfast Park Pope, MD   325 mg at 01/16/24 0913   gabapentin (NEURONTIN) capsule 300 mg  300 mg Oral BID Park Pope, MD   300 mg at 01/16/24 2114   hydrOXYzine (ATARAX) tablet 25 mg  25 mg Oral TID PRN Motley-Mangrum, Jadeka A, PMHNP       magnesium hydroxide (MILK OF MAGNESIA) suspension 30 mL  30 mL Oral Daily PRN Motley-Mangrum, Jadeka A, PMHNP       multivitamin with minerals tablet 1 tablet  1 tablet Oral Daily Motley-Mangrum, Jadeka A, PMHNP   1 tablet at 01/16/24 0913   naproxen (NAPROSYN) tablet 500 mg  500 mg Oral BID PRN Park Pope, MD   500 mg at 01/16/24 1612   nicotine (NICODERM CQ - dosed in mg/24 hours) patch 14 mg  14 mg Transdermal Daily Park Pope, MD   14 mg at 01/15/24 0933   thiamine (VITAMIN B1) tablet 100 mg  100 mg Oral Daily Motley-Mangrum, Jadeka A, PMHNP   100 mg at 01/16/24 0916   traZODone (DESYREL) tablet 50 mg  50 mg Oral QHS PRN,MR X 1 Park Pope, MD   50 mg at 01/16/24 2116   Current Outpatient Medications  Medication Sig Dispense Refill   ferrous sulfate 325 (65 FE) MG tablet Take 1 tablet (325 mg total) by mouth daily with breakfast. 30 tablet 0   gabapentin (NEURONTIN) 300 MG capsule Take 1 capsule (300 mg total) by mouth 2 (two) times daily. 60 capsule 0   Multiple Vitamin (MULTIVITAMIN WITH MINERALS) TABS tablet Take 1 tablet by mouth daily. (Patient not taking: Reported on 01/13/2024)     nicotine (NICODERM CQ) 14 mg/24hr patch Place 1 patch (14 mg total) onto the skin daily. 28 patch 0   thiamine (VITAMIN B-1) 100 MG tablet Take 1 tablet (100 mg total) by mouth daily. (Patient not taking: Reported on 01/13/2024)     traZODone (DESYREL) 50 MG tablet Take 1 tablet (50 mg total) by mouth at bedtime as needed and may repeat dose one time if  needed for sleep. 30 tablet 0   PTA Medications:  Facility Ordered Medications  Medication   [COMPLETED] thiamine (VITAMIN B1) tablet 100 mg   [EXPIRED] LORazepam (ATIVAN) tablet 1 mg   [EXPIRED] loperamide (IMODIUM) capsule 2-4 mg   multivitamin with minerals tablet 1 tablet   [EXPIRED] ondansetron (ZOFRAN-ODT) disintegrating tablet 4 mg   thiamine (VITAMIN B1) tablet 100 mg   acetaminophen (TYLENOL) tablet 650 mg   alum & mag hydroxide-simeth (MAALOX/MYLANTA) 200-200-20 MG/5ML suspension 30 mL   magnesium hydroxide (MILK OF MAGNESIA) suspension 30 mL   haloperidol (HALDOL) tablet 5 mg   And   diphenhydrAMINE (BENADRYL) capsule 50 mg   haloperidol lactate (HALDOL) injection 5  mg   And   diphenhydrAMINE (BENADRYL) injection 50 mg   And   LORazepam (ATIVAN) injection 2 mg   haloperidol lactate (HALDOL) injection 10 mg   And   diphenhydrAMINE (BENADRYL) injection 50 mg   And   LORazepam (ATIVAN) injection 2 mg   hydrOXYzine (ATARAX) tablet 25 mg   diphenhydrAMINE-zinc acetate (BENADRYL) 2-0.1 % cream   gabapentin (NEURONTIN) capsule 300 mg   nicotine (NICODERM CQ - dosed in mg/24 hours) patch 14 mg   [COMPLETED] potassium chloride SA (KLOR-CON M) CR tablet 40 mEq   ferrous sulfate tablet 325 mg   traZODone (DESYREL) tablet 50 mg   dicyclomine (BENTYL) tablet 20 mg   naproxen (NAPROSYN) tablet 500 mg   cyclobenzaprine (FLEXERIL) tablet 10 mg   PTA Medications  Medication Sig   thiamine (VITAMIN B-1) 100 MG tablet Take 1 tablet (100 mg total) by mouth daily. (Patient not taking: Reported on 01/13/2024)   Multiple Vitamin (MULTIVITAMIN WITH MINERALS) TABS tablet Take 1 tablet by mouth daily. (Patient not taking: Reported on 01/13/2024)   nicotine (NICODERM CQ) 14 mg/24hr patch Place 1 patch (14 mg total) onto the skin daily.   traZODone (DESYREL) 50 MG tablet Take 1 tablet (50 mg total) by mouth at bedtime as needed and may repeat dose one time if needed for sleep.   ferrous  sulfate 325 (65 FE) MG tablet Take 1 tablet (325 mg total) by mouth daily with breakfast.   gabapentin (NEURONTIN) 300 MG capsule Take 1 capsule (300 mg total) by mouth 2 (two) times daily.      01/14/2024    3:38 AM 12/12/2023   10:39 AM 12/11/2023   12:40 PM  Depression screen PHQ 2/9  Decreased Interest 2 0 1  Down, Depressed, Hopeless 2 0 1  PHQ - 2 Score 4 0 2  Altered sleeping 1 0 2  Tired, decreased energy 1 0 1  Change in appetite 0 0 1  Feeling bad or failure about yourself  3 0 2  Trouble concentrating 2 0 1  Moving slowly or fidgety/restless 2 0 2  Suicidal thoughts 1 0 0  PHQ-9 Score 14 0 11  Difficult doing work/chores Very difficult Not difficult at all Somewhat difficult   Flowsheet Row ED from 01/14/2024 in [facility] Most recent reading at 01/14/2024 10:55 AM ED from 01/12/2024 in [facility] Emergency Department at [facility] Most recent reading at 01/13/2024 12:33 AM ED from 01/12/2024 in [facility] Emergency Department at [facility] Most recent reading at 01/12/2024 12:41 PM  C-SSRS RISK CATEGORY No Risk Moderate Risk No Risk      Musculoskeletal  Strength & Muscle Tone: within normal limits Gait & Station: normal Patient leans: N/A   Psychiatric Specialty Exam  Presentation General Appearance:Appropriate for Environment, Disheveled Eye Contact:Fair Speech:Clear and Coherent, Normal Rate Volume:Normal Handedness:Right  Mood and Affect  Mood:Euthymic Affect:Congruent, Appropriate  Thought Process  Thought Process:Coherent Descriptions of Associations:Intact  Thought Content Suicidal Thoughts:No Homicidal Thoughts:No Hallucinations:None Ideas of Reference:None Thought Content:Logical  Sensorium  Memory:Remote Good Judgment:Fair Insight:Fair  Executive Functions  Orientation:Full (Time, Place and Person) Language:Good Concentration:Good Attention:Good Recall:Good Fund of  Knowledge:Good  Psychomotor Activity  Psychomotor Activity:Psychomotor Activity: Normal  Assets  Assets:Communication Skills, Resilience, Social Support  Sleep  Quality:Good  Physical Exam  Physical Exam Vitals reviewed.  Constitutional:      Appearance: Normal appearance.  HENT:     Head: Normocephalic and atraumatic.  Cardiovascular:  Rate and Rhythm: Normal rate.  Pulmonary:     Effort: Pulmonary effort is normal.  Musculoskeletal:     Comments: Has a walker  Neurological:     General: No focal deficit present.     Mental Status: He is alert and oriented to person, place, and time.    Blood pressure (!) 104/58, pulse 89, temperature 98.1 F (36.7 C), temperature source Oral, resp. rate 19, SpO2 99%. There is no height or weight on file to calculate BMI.  Demographic Factors:  Male  Loss Factors: NA  Historical Factors: Impulsivity  Risk Reduction Factors:   Living with another person, especially a relative and Positive social support  Continued Clinical Symptoms:  Alcohol/Substance Abuse/Dependencies  Cognitive Features That Contribute To Risk:  Closed-mindedness    Suicide Risk:  Mild:  Suicidal ideation of limited frequency, intensity, duration, and specificity.  There are no identifiable plans, no associated intent, mild dysphoria and related symptoms, good self-control (both objective and subjective assessment), few other risk factors, and identifiable protective factors, including available and accessible social support.   Plan Of Care/Follow-up recommendations:  Activity as tolerated. Diet as recommended by PCP. Keep all scheduled follow-up appointments as recommended.  Patient is instructed to take all prescribed medications as recommended. Report any side effects or adverse reactions to your outpatient psychiatrist. Patient is instructed to abstain from alcohol and illegal drugs while on prescription medications. In the event of worsening  symptoms, patient is instructed to call the crisis hotline, 911, or go to the nearest emergency department for evaluation and treatment.  Prescriptions given at discharge. Patient agreeable to plan. Given opportunity to ask questions. Appears to feel comfortable with discharge.  Patient is also instructed prior to discharge to: Take all medications as prescribed by mental healthcare provider. Report any adverse effects and or reactions from the medicines to outpatient provider promptly. Patient has been instructed & cautioned: To not engage in alcohol and or illegal drug use while on prescription medicines. In the event of worsening symptoms,  patient is instructed to call the crisis hotline, 911 and or go to the nearest ED for appropriate evaluation and treatment of symptoms. To follow-up with primary care provider for other medical issues, concerns and or health care needs  The patient was evaluated each day by a clinical provider to ascertain response to treatment. Improvement was noted by the patient's report of decreasing symptoms, improved sleep and appetite, affect, medication tolerance, behavior, and participation in unit programming.  Patient was asked each day to complete a self inventory noting mood, mental status, pain, new symptoms, anxiety and concerns.  Patient responded well to medication and being in a therapeutic and supportive environment. Positive and appropriate behavior was noted and the patient was motivated for recovery. The patient worked closely with the treatment team and case manager to develop a discharge plan with appropriate goals. Coping skills, problem solving as well as relaxation therapies were also part of the unit programming.  By the day of discharge patient was in much improved condition than upon admission.  Symptoms were reported as significantly decreased or resolved completely. The patient was motivated to continue taking medication with a goal of continued  improvement in mental health.    Disposition:  Home with CDIOP on 4/21  Lance Muss, MD Psych Resident, PGY-2

## 2024-01-17 NOTE — ED Notes (Signed)
 Patient alert & oriented x4. Denies intent to harm self or others when asked. Denies A/VH. Patient reported chronic pain in L leg rating 8/10, PRN medication given and now rating 7/10. Patient reports it had lessened further but has went back up due to ambulating. No acute distress noted. Support and encouragement provided. Patient planned to discharge in next few minutes, no concerns regarding this plan. Routine safety checks conducted per facility protocol. Encouraged patient to notify staff if any thoughts of harm towards self or others arise. Patient verbalizes understanding and agreement.

## 2024-01-17 NOTE — Discharge Instructions (Addendum)
 Please go to your CDIOP appointment on 4/21 at 9 AM at the following address: [address] [place], [place] [address]  Follow-up recommendations:  Activity:  Normal, as tolerated Diet:  Per PCP recommendation  Patient is instructed prior to discharge to: Take all medications as prescribed by his mental healthcare provider. Report any adverse effects and/or reactions from the medicines to his outpatient provider promptly. Patient has been instructed & cautioned: To not engage in alcohol and or illegal drug use while on prescription medicines.  In the event of worsening symptoms, patient is instructed to call the crisis hotline at 988, 911 and or go to the nearest ED for appropriate evaluation and treatment of symptoms. To follow-up with his primary care provider for your other medical issues, concerns and or health care needs.

## 2024-01-17 NOTE — ED Notes (Signed)
 Patient was provided breakfast

## 2024-01-17 NOTE — ED Notes (Signed)
 Patient is sleeping. Respirations equal and unlabored, skin warm and dry. No change in assessment or acuity. Routine safety checks conducted according to facility protocol. Will continue to monitor for safety.

## 2024-01-17 NOTE — ED Notes (Signed)
 Patient resting with eyes closed in no apparent acute distress. Respirations even and unlabored. Environment secured. Safety checks in place according to facility policy.

## 2024-01-19 ENCOUNTER — Encounter (HOSPITAL_COMMUNITY): Payer: Self-pay | Admitting: Emergency Medicine

## 2024-01-19 ENCOUNTER — Other Ambulatory Visit: Payer: Self-pay

## 2024-01-19 ENCOUNTER — Emergency Department (HOSPITAL_COMMUNITY)
Admission: EM | Admit: 2024-01-19 | Discharge: 2024-01-19 | Disposition: A | Discharge status: Home / Self Care | Payer: MEDICAID | Attending: Emergency Medicine | Admitting: Emergency Medicine

## 2024-01-19 DIAGNOSIS — M79605 Pain in left leg: Secondary | ICD-10-CM | POA: Insufficient documentation

## 2024-01-19 NOTE — ED Provider Triage Note (Signed)
 Emergency Medicine Provider Triage Evaluation Note  Mark Hammond , a 33 y.o. male  was evaluated in triage.  Pt complains of left-sided leg pain.  Pain is been ongoing since January.  He has had multiple MRIs which have been nondiagnostic.  Patient came in tonight requesting a shot due to inability to tolerate pain.  He does have an appointment with neurology scheduled for next week.  He denies any new injuries or complaints.  Review of Systems  Positive:  Negative:   Physical Exam  BP 100/62 (BP Location: Left Arm)   Pulse (!) 108   Temp 97.8 F (36.6 C) (Oral)   Resp 16   Ht 5\' 4"  (1.626 m)   Wt 63.5 kg   SpO2 98%   BMI 24.03 kg/m  Gen:   Awake, no distress   Resp:  Normal effort  MSK:   Moves extremities without difficulty  Other:    Medical Decision Making  Medically screening exam initiated at 4:17 AM.  Appropriate orders placed.  Mark Hammond was informed that the remainder of the evaluation will be completed by another provider, this initial triage assessment does not replace that evaluation, and the importance of remaining in the ED until their evaluation is complete.     Elisa Guest, [place] 01/19/24 [phone]

## 2024-01-19 NOTE — Discharge Instructions (Signed)
 As we discussed, you can increase your gabapentin  to 300 mg 3 times daily. If easier, you can take 2 tablets in the morning and one in the evening.   Keep your appointment with neurology for the 24th.

## 2024-01-19 NOTE — ED Provider Notes (Signed)
  [facility] Provider Note   CSN: [id number] Arrival date & time: 01/19/24  4098     History {Add pertinent medical, surgical, social history, OB history to HPI:1} Chief Complaint  Patient presents with   Leg Pain    Robey Massmann is a 33 y.o. male.  Left leg pain  The history is provided by the patient. No language interpreter was used.  Leg Pain      Home Medications Prior to Admission medications   Medication Sig Start Date End Date Taking? Authorizing Provider  ferrous sulfate  325 (65 FE) MG tablet Take 1 tablet (325 mg total) by mouth daily with breakfast. 01/17/24   Joice Nares, MD  gabapentin  (NEURONTIN ) 300 MG capsule Take 1 capsule (300 mg total) by mouth 2 (two) times daily. 01/17/24   Hoang, Daniela B, MD  Multiple Vitamin (MULTIVITAMIN WITH MINERALS) TABS tablet Take 1 tablet by mouth daily. Patient not taking: Reported on 01/13/2024 12/13/23   Baltazar Bonier, MD  nicotine  (NICODERM CQ ) 14 mg/24hr patch Place 1 patch (14 mg total) onto the skin daily. 01/17/24   Joice Nares, MD  thiamine  (VITAMIN B-1) 100 MG tablet Take 1 tablet (100 mg total) by mouth daily. Patient not taking: Reported on 01/13/2024 12/13/23   Baltazar Bonier, MD  traZODone  (DESYREL ) 50 MG tablet Take 1 tablet (50 mg total) by mouth at bedtime as needed and may repeat dose one time if needed for sleep. 01/17/24   Joice Nares, MD      Allergies    Codeine    Review of Systems   Review of Systems  Physical Exam Updated Vital Signs BP 110/74 (BP Location: Right Arm)   Pulse (!) 109   Temp 98.2 F (36.8 C) (Oral)   Resp 18   Ht 5\' 4"  (1.626 m)   Wt 63.5 kg   SpO2 97%   BMI 24.03 kg/m  Physical Exam  ED Results / Procedures / Treatments   Labs (all labs ordered are listed, but only abnormal results are displayed) Labs Reviewed - No data to display  EKG None  Radiology No results  found.  Procedures Procedures  {Document cardiac monitor, telemetry assessment procedure when appropriate:1}  Medications Ordered in ED Medications - No data to display  ED Course/ Medical Decision Making/ A&P   {   Click here for ABCD2, HEART and other calculatorsREFRESH Note before signing :1}                              Medical Decision Making  ***  {Document critical care time when appropriate:1} {Document review of labs and clinical decision tools ie heart score, Chads2Vasc2 etc:1}  {Document your independent review of radiology images, and any outside records:1} {Document your discussion with family members, caretakers, and with consultants:1} {Document social determinants of health affecting pt's care:1} {Document your decision making why or why not admission, treatments were needed:1} Final Clinical Impression(s) / ED Diagnoses Final diagnoses:  None    Rx / DC Orders ED Discharge Orders     None

## 2024-01-19 NOTE — ED Triage Notes (Signed)
 Patient BIB EMS for evaluation of L leg pain.  Hx of same since January.  Reports pain became severe tonight and he was unable to tolerate pain.  Has a neurology appointment scheduled to be seen for pain "next week."  No reports of new injuries

## 2024-01-20 ENCOUNTER — Emergency Department (HOSPITAL_COMMUNITY)
Admission: EM | Admit: 2024-01-20 | Discharge: 2024-01-21 | Disposition: A | Discharge status: Other Acute Inpatient Hospital | Payer: MEDICAID | Attending: Emergency Medicine | Admitting: Emergency Medicine

## 2024-01-20 ENCOUNTER — Encounter (HOSPITAL_COMMUNITY): Payer: Self-pay | Admitting: *Deleted

## 2024-01-20 ENCOUNTER — Other Ambulatory Visit: Payer: Self-pay

## 2024-01-20 DIAGNOSIS — F1020 Alcohol dependence, uncomplicated: Secondary | ICD-10-CM | POA: Diagnosis not present

## 2024-01-20 DIAGNOSIS — F1420 Cocaine dependence, uncomplicated: Secondary | ICD-10-CM | POA: Diagnosis not present

## 2024-01-20 DIAGNOSIS — F1994 Other psychoactive substance use, unspecified with psychoactive substance-induced mood disorder: Secondary | ICD-10-CM | POA: Diagnosis not present

## 2024-01-20 DIAGNOSIS — R45851 Suicidal ideations: Secondary | ICD-10-CM | POA: Insufficient documentation

## 2024-01-20 DIAGNOSIS — F1910 Other psychoactive substance abuse, uncomplicated: Secondary | ICD-10-CM | POA: Diagnosis not present

## 2024-01-20 DIAGNOSIS — F250 Schizoaffective disorder, bipolar type: Secondary | ICD-10-CM | POA: Diagnosis present

## 2024-01-20 DIAGNOSIS — G8929 Other chronic pain: Secondary | ICD-10-CM | POA: Diagnosis not present

## 2024-01-20 DIAGNOSIS — R45850 Homicidal ideations: Secondary | ICD-10-CM | POA: Diagnosis not present

## 2024-01-20 DIAGNOSIS — M79605 Pain in left leg: Secondary | ICD-10-CM | POA: Diagnosis not present

## 2024-01-20 DIAGNOSIS — Z79899 Other long term (current) drug therapy: Secondary | ICD-10-CM | POA: Diagnosis not present

## 2024-01-20 DIAGNOSIS — R451 Restlessness and agitation: Secondary | ICD-10-CM | POA: Diagnosis not present

## 2024-01-20 DIAGNOSIS — F1520 Other stimulant dependence, uncomplicated: Secondary | ICD-10-CM | POA: Insufficient documentation

## 2024-01-20 LAB — COMPREHENSIVE METABOLIC PANEL WITH GFR
ALT: 86 U/L — ABNORMAL HIGH (ref 0–44)
AST: 90 U/L — ABNORMAL HIGH (ref 15–41)
Albumin: 3.2 g/dL — ABNORMAL LOW (ref 3.5–5.0)
Alkaline Phosphatase: 128 U/L — ABNORMAL HIGH (ref 38–126)
Anion gap: 11 (ref 5–15)
BUN: <5 mg/dL — ABNORMAL LOW (ref 6–20)
CO2: 27 mmol/L (ref 22–32)
Calcium: 8.4 mg/dL — ABNORMAL LOW (ref 8.9–10.3)
Chloride: 101 mmol/L (ref 98–111)
Creatinine, Ser: 0.51 mg/dL — ABNORMAL LOW (ref 0.61–1.24)
GFR, Estimated: >60 mL/min (ref >60)
Glucose, Bld: 112 mg/dL — ABNORMAL HIGH (ref 70–99)
Potassium: 3.4 mmol/L — ABNORMAL LOW (ref 3.5–5.1)
Sodium: 139 mmol/L (ref 135–145)
Total Bilirubin: 0.3 mg/dL (ref 0.0–1.2)
Total Protein: 7.9 g/dL (ref 6.5–8.1)

## 2024-01-20 LAB — RAPID URINE DRUG SCREEN, HOSP PERFORMED
Amphetamines: NOT DETECTED
Barbiturates: NOT DETECTED
Benzodiazepines: NOT DETECTED
Cocaine: POSITIVE — AB
Opiates: NOT DETECTED
Tetrahydrocannabinol: NOT DETECTED

## 2024-01-20 LAB — CBC WITH DIFFERENTIAL/PLATELET
Abs Immature Granulocytes: 0.03 10*3/uL (ref 0.00–0.07)
Basophils Absolute: 0.1 10*3/uL (ref 0.0–0.1)
Basophils Relative: 1 %
Eosinophils Absolute: 0 10*3/uL (ref 0.0–0.5)
Eosinophils Relative: 0 %
HCT: 37.7 % — ABNORMAL LOW (ref 39.0–52.0)
Hemoglobin: 12.4 g/dL — ABNORMAL LOW (ref 13.0–17.0)
Immature Granulocytes: 0 %
Lymphocytes Relative: 25 %
Lymphs Abs: 2.9 10*3/uL (ref 0.7–4.0)
MCH: 29.5 pg (ref 26.0–34.0)
MCHC: 32.9 g/dL (ref 30.0–36.0)
MCV: 89.8 fL (ref 80.0–100.0)
Monocytes Absolute: 1 10*3/uL (ref 0.1–1.0)
Monocytes Relative: 9 %
Neutro Abs: 7.6 10*3/uL (ref 1.7–7.7)
Neutrophils Relative %: 65 %
Platelets: 344 10*3/uL (ref 150–400)
RBC: 4.2 MIL/uL — ABNORMAL LOW (ref 4.22–5.81)
RDW: 14.6 % (ref 11.5–15.5)
WBC: 11.6 10*3/uL — ABNORMAL HIGH (ref 4.0–10.5)
nRBC: 0 % (ref 0.0–0.2)

## 2024-01-20 LAB — ETHANOL: Alcohol, Ethyl (B): 255 mg/dL — ABNORMAL HIGH (ref <10)

## 2024-01-20 MED ORDER — THIAMINE MONONITRATE 100 MG PO TABS
100.0000 mg | ORAL_TABLET | Freq: Every day | ORAL | Status: DC
Start: 2024-01-21 — End: 2024-01-21

## 2024-01-20 MED ORDER — NICOTINE 14 MG/24HR TD PT24: 14.0000 mg | MEDICATED_PATCH | Freq: Every day | TRANSDERMAL | Status: DC

## 2024-01-20 MED ORDER — LACTATED RINGERS IV BOLUS
1000.0000 mL | Freq: Once | INTRAVENOUS | Status: AC
  Administered 2024-01-20: 1000 mL via INTRAVENOUS

## 2024-01-20 MED ORDER — ACETAMINOPHEN 325 MG PO TABS
650.0000 mg | ORAL_TABLET | Freq: Once | ORAL | Status: AC
  Administered 2024-01-20: 650 mg via ORAL
  Filled 2024-01-20: qty 2

## 2024-01-20 MED ORDER — FERROUS SULFATE 325 (65 FE) MG PO TABS: 325.0000 mg | ORAL_TABLET | Freq: Every day | ORAL | Status: DC

## 2024-01-20 MED ORDER — TRAZODONE HCL 100 MG PO TABS: 50.0000 mg | ORAL_TABLET | Freq: Every evening | ORAL | Status: DC | PRN

## 2024-01-20 MED ORDER — GABAPENTIN 300 MG PO CAPS
300.0000 mg | ORAL_CAPSULE | Freq: Two times a day (BID) | ORAL | Status: DC
  Administered 2024-01-21: 300 mg via ORAL
  Filled 2024-01-20: qty 1

## 2024-01-20 NOTE — ED Triage Notes (Signed)
 Pt says that he has to come to the hospital every week for his leg pain. He says that he is mainly here for SI and HI thoughts, not as "much the homicidal as the suicidal".  He says he would overdose on fentanyl. Pt says its just the "thoughts in my head that make me have homicidal thoughts in my head, but I would never act on that, if anything I would act on the suicidal"

## 2024-01-20 NOTE — ED Triage Notes (Signed)
 Pt arrives via [facility] from [place] [employer]. Cocaine /meth use 2  hours ago, went to [employer] to buy beer to cope with leg pain.  Upper left leg pain since January. SI/HI- SI plan would be for fentanyl overdose, but unable to find any at this time. No HI plans. 156 palpated, hr 130, cbg 164,

## 2024-01-20 NOTE — ED Provider Notes (Signed)
 Mark Hammond Provider Note   CSN: [id number] Arrival date & time: 01/20/24  2130     History {Add pertinent medical, surgical, social history, OB history to HPI:1} Chief Complaint  Patient presents with   Suicidal   Leg Pain    Mark Hammond is a 33 y.o. male.   Leg Pain    Patient has a history of schizoaffective schizophrenia, bipolar disorder, drug abuse, alcohol abuse.  Patient also has a history of chronic leg pain.  Patient states he has had chronic left leg pain ongoing at least for several months now.  He has been evaluated in the emergency department a number of times for this condition.  Patient has had plain film x-rays that did not show any acute abnormality.  He has had MRIs of the lumbar spine that did not show any acute abnormality.  Patient did have an MRI on April 5 that showed some inflammatory changes around the lower lumbar erector spinae muscle.  Patient presents ED today because of suicidal ideation.  He admits to using meth today.  He reports feeling suicidal.  Thinking of overdosing on fentanyl .  Patient has had transient thoughts of homicide but does not intend to harm anyone.  [place] [facility]  Home Medications Prior to Admission medications   Medication Sig Start Date End Date Taking? Authorizing Provider  ferrous sulfate  325 (65 FE) MG tablet Take 1 tablet (325 mg total) by mouth daily with breakfast. 01/17/24   Joice Nares, MD  gabapentin  (NEURONTIN ) 300 MG capsule Take 1 capsule (300 mg total) by mouth 2 (two) times daily. 01/17/24   Hoang, Daniela B, MD  Multiple Vitamin (MULTIVITAMIN WITH MINERALS) TABS tablet Take 1 tablet by mouth daily. Patient not taking: Reported on 01/13/2024 12/13/23   Baltazar Bonier, MD  nicotine  (NICODERM CQ ) 14 mg/24hr patch Place 1 patch (14 mg total) onto the skin daily. 01/17/24   Joice Nares, MD  thiamine  (VITAMIN B-1) 100 MG tablet  Take 1 tablet (100 mg total) by mouth daily. Patient not taking: Reported on 01/13/2024 12/13/23   Baltazar Bonier, MD  traZODone  (DESYREL ) 50 MG tablet Take 1 tablet (50 mg total) by mouth at bedtime as needed and may repeat dose one time if needed for sleep. 01/17/24   Joice Nares, MD      Allergies    Codeine    Review of Systems   Review of Systems  Physical Exam Updated Vital Signs BP 122/69   Pulse (!) 121   Temp 98.2 F (36.8 C)   Resp 16   SpO2 100%  Physical Exam Vitals and nursing note reviewed.  Constitutional:      Appearance: He is well-developed. He is not diaphoretic.  HENT:     Head: Normocephalic and atraumatic.     Right Ear: External ear normal.     Left Ear: External ear normal.  Eyes:     General: No scleral icterus.       Right eye: No discharge.        Left eye: No discharge.     Conjunctiva/sclera: Conjunctivae normal.  Neck:     Trachea: No tracheal deviation.  Cardiovascular:     Rate and Rhythm: Regular rhythm. Tachycardia present.  Pulmonary:     Effort: Pulmonary effort is normal. No respiratory distress.     Breath sounds: Normal breath sounds. No stridor. No wheezing or rales.  Abdominal:     General: Bowel sounds are normal. There is no distension.     Palpations: Abdomen is soft.     Tenderness: There is no abdominal tenderness. There is no guarding or rebound.  Musculoskeletal:        General: Tenderness present. No deformity.     Cervical back: Neck supple.     Comments: Mild tenderness palpation left calf, no edema no erythema, normal pulses  Skin:    General: Skin is warm and dry.     Findings: No rash.  Neurological:     General: No focal deficit present.     Mental Status: He is alert.     Cranial Nerves: No cranial nerve deficit, dysarthria or facial asymmetry.     Sensory: No sensory deficit.     Motor: No abnormal muscle tone or seizure activity.     Coordination: Coordination normal.  Psychiatric:         Mood and Affect: Mood normal.     ED Results / Procedures / Treatments   Labs (all labs ordered are listed, but only abnormal results are displayed) Labs Reviewed - No data to display  EKG EKG Interpretation Date/Time:  Saturday January 20 2024 21:44:12 EDT Ventricular Rate:  117 PR Interval:  136 QRS Duration:  87 QT Interval:  317 QTC Calculation: 443 R Axis:   62  Text Interpretation: Sinus tachycardia Since last tracing rate faster Confirmed by Trish Furl [phone]) on 01/20/2024 9:59:00 PM  Radiology No results found.  Procedures Procedures  {Document cardiac monitor, telemetry assessment procedure when appropriate:1}  Medications Ordered in ED Medications  lactated ringers  bolus 1,000 mL (has no administration in time range)  acetaminophen  (TYLENOL ) tablet 650 mg (has no administration in time range)    ED Course/ Medical Decision Making/ A&P   {   Click here for ABCD2, HEART and other calculatorsREFRESH Note before signing :1}                              Medical Decision Making Amount and/or Complexity of Data Reviewed Labs: ordered.  Risk OTC drugs.   ***  {Document critical care time when appropriate:1} {Document review of labs and clinical decision tools ie heart score, Chads2Vasc2 etc:1}  {Document your independent review of radiology images, and any outside records:1} {Document your discussion with family members, caretakers, and with consultants:1} {Document social determinants of health affecting pt's care:1} {Document your decision making why or why not admission, treatments were needed:1} Final Clinical Impression(s) / ED Diagnoses Final diagnoses:  None    Rx / DC Orders ED Discharge Orders     None

## 2024-01-21 ENCOUNTER — Other Ambulatory Visit: Payer: Self-pay

## 2024-01-21 ENCOUNTER — Ambulatory Visit (HOSPITAL_COMMUNITY)
Admission: EM | Admit: 2024-01-21 | Discharge: 2024-01-22 | Disposition: A | Discharge status: Home / Self Care | Payer: MEDICAID | Attending: Psychiatry | Admitting: Psychiatry

## 2024-01-21 DIAGNOSIS — Z79899 Other long term (current) drug therapy: Secondary | ICD-10-CM | POA: Insufficient documentation

## 2024-01-21 DIAGNOSIS — R45850 Homicidal ideations: Secondary | ICD-10-CM | POA: Insufficient documentation

## 2024-01-21 DIAGNOSIS — F1020 Alcohol dependence, uncomplicated: Secondary | ICD-10-CM | POA: Diagnosis not present

## 2024-01-21 DIAGNOSIS — F1910 Other psychoactive substance abuse, uncomplicated: Secondary | ICD-10-CM | POA: Insufficient documentation

## 2024-01-21 DIAGNOSIS — F1420 Cocaine dependence, uncomplicated: Secondary | ICD-10-CM | POA: Diagnosis not present

## 2024-01-21 DIAGNOSIS — M79605 Pain in left leg: Secondary | ICD-10-CM | POA: Insufficient documentation

## 2024-01-21 DIAGNOSIS — F250 Schizoaffective disorder, bipolar type: Secondary | ICD-10-CM | POA: Insufficient documentation

## 2024-01-21 DIAGNOSIS — R45851 Suicidal ideations: Secondary | ICD-10-CM

## 2024-01-21 DIAGNOSIS — F1520 Other stimulant dependence, uncomplicated: Secondary | ICD-10-CM | POA: Diagnosis not present

## 2024-01-21 MED ORDER — DIPHENHYDRAMINE HCL 50 MG/ML IJ SOLN: 50.0000 mg | Freq: Three times a day (TID) | INTRAMUSCULAR | Status: DC | PRN

## 2024-01-21 MED ORDER — LORAZEPAM 1 MG PO TABS: 1.0000 mg | ORAL_TABLET | Freq: Every day | ORAL | Status: DC

## 2024-01-21 MED ORDER — HALOPERIDOL 5 MG PO TABS: 5.0000 mg | ORAL_TABLET | Freq: Three times a day (TID) | ORAL | Status: DC | PRN

## 2024-01-21 MED ORDER — LORAZEPAM 1 MG PO TABS
1.0000 mg | ORAL_TABLET | Freq: Four times a day (QID) | ORAL | Status: AC
  Administered 2024-01-21 (×4): 1 mg via ORAL
  Filled 2024-01-21 (×4): qty 1

## 2024-01-21 MED ORDER — THIAMINE HCL 100 MG/ML IJ SOLN
100.0000 mg | Freq: Once | INTRAMUSCULAR | Status: AC
  Administered 2024-01-21: 100 mg via INTRAMUSCULAR
  Filled 2024-01-21: qty 2

## 2024-01-21 MED ORDER — LORAZEPAM 1 MG PO TABS
1.0000 mg | ORAL_TABLET | Freq: Three times a day (TID) | ORAL | Status: DC
  Administered 2024-01-22: 1 mg via ORAL
  Filled 2024-01-21: qty 1

## 2024-01-21 MED ORDER — LORAZEPAM 2 MG/ML IJ SOLN: 2.0000 mg | Freq: Three times a day (TID) | INTRAMUSCULAR | Status: DC | PRN

## 2024-01-21 MED ORDER — LACTATED RINGERS IV BOLUS
1000.0000 mL | Freq: Once | INTRAVENOUS | Status: AC
  Administered 2024-01-21: 1000 mL via INTRAVENOUS

## 2024-01-21 MED ORDER — IBUPROFEN 600 MG PO TABS
600.0000 mg | ORAL_TABLET | Freq: Four times a day (QID) | ORAL | Status: DC | PRN
  Administered 2024-01-21 – 2024-01-22 (×2): 600 mg via ORAL
  Filled 2024-01-21 (×2): qty 1

## 2024-01-21 MED ORDER — TRAZODONE HCL 50 MG PO TABS
50.0000 mg | ORAL_TABLET | Freq: Every evening | ORAL | Status: DC | PRN
  Administered 2024-01-21: 50 mg via ORAL
  Filled 2024-01-21: qty 1

## 2024-01-21 MED ORDER — MAGNESIUM HYDROXIDE 400 MG/5ML PO SUSP: 30.0000 mL | Freq: Every day | ORAL | Status: DC | PRN

## 2024-01-21 MED ORDER — ALUM & MAG HYDROXIDE-SIMETH 200-200-20 MG/5ML PO SUSP: 30.0000 mL | ORAL | Status: DC | PRN

## 2024-01-21 MED ORDER — HYDROXYZINE HCL 25 MG PO TABS: 25.0000 mg | ORAL_TABLET | Freq: Four times a day (QID) | ORAL | Status: DC | PRN

## 2024-01-21 MED ORDER — LORAZEPAM 1 MG PO TABS: 1.0000 mg | ORAL_TABLET | Freq: Four times a day (QID) | ORAL | Status: DC | PRN

## 2024-01-21 MED ORDER — DIPHENHYDRAMINE HCL 50 MG PO CAPS: 50.0000 mg | ORAL_CAPSULE | Freq: Three times a day (TID) | ORAL | Status: DC | PRN

## 2024-01-21 MED ORDER — HYDROXYZINE HCL 25 MG PO TABS: 25.0000 mg | ORAL_TABLET | Freq: Three times a day (TID) | ORAL | Status: DC | PRN

## 2024-01-21 MED ORDER — ONDANSETRON 4 MG PO TBDP: 4.0000 mg | ORAL_TABLET | Freq: Four times a day (QID) | ORAL | Status: DC | PRN

## 2024-01-21 MED ORDER — HALOPERIDOL LACTATE 5 MG/ML IJ SOLN: 10.0000 mg | Freq: Three times a day (TID) | INTRAMUSCULAR | Status: DC | PRN

## 2024-01-21 MED ORDER — THIAMINE MONONITRATE 100 MG PO TABS
100.0000 mg | ORAL_TABLET | Freq: Every day | ORAL | Status: DC
  Administered 2024-01-22: 100 mg via ORAL
  Filled 2024-01-21: qty 1

## 2024-01-21 MED ORDER — LACTATED RINGERS IV BOLUS: 1000.0000 mL | Freq: Once | INTRAVENOUS | Status: DC

## 2024-01-21 MED ORDER — LORAZEPAM 1 MG PO TABS: 1.0000 mg | ORAL_TABLET | Freq: Two times a day (BID) | ORAL | Status: DC

## 2024-01-21 MED ORDER — ADULT MULTIVITAMIN W/MINERALS CH
1.0000 | ORAL_TABLET | Freq: Every day | ORAL | Status: DC
  Administered 2024-01-21 – 2024-01-22 (×2): 1 via ORAL
  Filled 2024-01-21 (×2): qty 1

## 2024-01-21 MED ORDER — ACETAMINOPHEN 325 MG PO TABS
650.0000 mg | ORAL_TABLET | Freq: Four times a day (QID) | ORAL | Status: DC | PRN
  Administered 2024-01-21 – 2024-01-22 (×4): 650 mg via ORAL
  Filled 2024-01-21 (×5): qty 2

## 2024-01-21 MED ORDER — LOPERAMIDE HCL 2 MG PO CAPS: 2.0000 mg | ORAL_CAPSULE | ORAL | Status: DC | PRN

## 2024-01-21 MED ORDER — HALOPERIDOL LACTATE 5 MG/ML IJ SOLN: 5.0000 mg | Freq: Three times a day (TID) | INTRAMUSCULAR | Status: DC | PRN

## 2024-01-21 NOTE — Progress Notes (Addendum)
 Mark Hammond is a 33 year old male patient with a past psychiatric history significant for schizoaffective disorder, bipolar type, alcohol use disorder and polysubstance abuse (cocaine  methamphetamine abuse, cannabis and alcohol) who presented early this morning on 4/20 around 2:19 AM to the [facility] reporting suicidal ideations with a plan to overdose on fentanyl. He was seen by Cicero Crawley, NP and was recommended for overnight observation.  During rounds this morning, the patient tells me that he suddenly started having suicidal thoughts after he had an argument with his uncle yesterday about his physical health and drug addiction. He reported feeling like he wanted to overdose on fentanyl. However, he denies access to Fentanyl. He reports abusing drugs and drinking alcohol since age 40 years old. He reports using crack every day, on average $48 worth, method of use is smoking and reports that he last used yesterday. He reports using methamphetamine "once in a blue moon," method of use is smoking and reports that he last used 2 days ago. He reports drinking alcohol every day, on average 3 to 4 (40) ounces per day and reports that he last consumed alcohol yesterday.  He reports withdrawal symptoms of a little vomiting, headache, generalized body pain, nausea, and sweats. He was started on an Ativan  taper protocol on arrival for alcohol withdrawal. He continues to report ongoing pain to his left leg. He reports that the doctors have not been able to find a cause for his leg pain and that he has a neurology appointment that he would like to make it to on Thursday January 25, 2024. Ibuprofen  600 mg every 6 hours was added for pain. Patient also reports ambulating with a walker due to leg pain. Patient denies homicidal ideations. He denies auditory or visual hallucinations. There is no objective evidence that the patient is currently responding to internal or external stimuli. He  reports poor sleep due to leg pain. He reports a good appetite. He reports feeling depressed his whole life and states that his depression got worse yesterday after the argument. He endorses depressive symptoms of sadness, hopelessness, guilt, and agitation. He denies medication side effects.  Plan: This patient was recommended for overnight observation this am. Patient could benefit [facility] to continue substance abuse treatment and once stabilized patient would be able to make it to his neurology appointment on January 25, 2024. Patient also has an appointment with Thomasena Fleming on Monday, 4/21 for CDIOP at 9:00 AM.

## 2024-01-21 NOTE — ED Notes (Addendum)
 Speaking with NP Patrice now.

## 2024-01-21 NOTE — ED Notes (Signed)
 This RN Print production planner for ride to [employer].

## 2024-01-21 NOTE — BH Assessment (Signed)
 Comprehensive Clinical Assessment (CCA) Note  01/21/2024 Mark Hammond [id number]  DISPOSITION: Gave clinical report to Mark Alpers, NP who recommended Pt be transferred to [facility] for continuous assessment. Mark Hammond, [facility] confirmed bed availability. Notified Dr. Dolores Hammond and Mark Kirsten, RN of recommendation.  The patient demonstrates the following risk factors for suicide: Chronic risk factors for suicide include: psychiatric disorder of schizoaffective disorder, substance use disorder, and previous suicide attempts by overdose . Acute risk factors for suicide include: unemployment, social withdrawal/isolation, and loss (financial, interpersonal, professional). Protective factors for this patient include:  none identified . Considering these factors, the overall suicide risk at this point appears to be high. Patient is not appropriate for outpatient follow up.  Pt is a 33 year old single male who presents unaccompanied to [facility] reporting suicidal ideation, homicidal ideation, substance use, and leg pain. Per medical record, Pt has a history of schizoaffective disorder, bipolar type, alcohol use disorder and a history of using cocaine , methamphetamines, and cannabis. Pt says he felt today, which triggered suicidal ideation with plan to overdose on fentanyl . Pt says he has attempted suicide in the past by overdosing on fentanyl . He describes vague thoughts of harming others earlier today but denies current thoughts, plan, or intent to harm anyone. He denies history of violence. Pt says he is experiencing auditory hallucinations of mumbled voices that he cannot understand. He describes his mood as "half and half", explaining that sometimes he feels happy and sometimes he feels stressed. He acknowledges symptoms including crying spells, social withdrawal, loss of interest in usual pleasures, fatigue, irritability, decreased concentration, and feelings of guilt, worthlessness and  hopelessness.   Pt reports drinking 48 ounces of wine plus 120 ounces of beer daily, with last use 01/20/2024. He reports smoking approximately $40 worth of crack daily, with last use 01/19/2024. He reports smoking approximately $10 worth of methamphetamines daily, last use 01/19/2024. He says he smokes 1-2 blunts of marijuana "occasionally." He denies other substance use. Alcohol level is 255 and drug screen is positive for cocaine . Pt says he experiences withdrawal symptoms when coming off substances including tremors, body pain, sweating, chills, and anxiety.  Pt identifies "family matters" as his primary stressor. He says he typically relies on his uncle for support and housing but they are having conflicts currently. Pt says his uncle disagrees with how Pt manages his life. Pt says because of this conflict he has no support and is currently homeless. Pt is unemployed and says he applied for disability last month. He denies history of abuse. He denies legal problems. He denies access to firearms.  Pt says he has no outpatient mental health providers. Pt was in [facility] 04/12-04/16/2025 and says he is taking prescribed medications daily (see MAR). Pt has been psychiatrically hospitalized several times in the past and says he spent 3 weeks at [facility] in February 2025. Pt has frequent presentations to emergency departments with mental health and substance use symptoms.  Pt is covered by a blanket, alert and oriented x4. Pt speaks in a clear tone, at moderate volume and normal pace. Motor behavior appears normal. Eye contact is good. Pt's mood is mildly depressed and affect is congruent with mood. Thought process is coherent and relevant. There is no indication he is currently responding to internal stimuli or experiencing delusional thought content. He is calm and cooperative. He is requesting inpatient psychiatric treatment but does not want to be re-admitted to [facility].  Chief Complaint:  Chief  Complaint  Patient  presents with   Suicidal   Leg Pain   Visit Diagnosis:  F25.0 Schizoaffective disorder, Bipolar type F10.20 Alcohol use disorder, Severe F14.20 Cocaine  use disorder, Severe F15.20 Amphetamine-type substance use disorder, Severe   CCA Screening, Triage and Referral (STR)  Patient Reported Information How did you hear about us ? Self  What Is the Reason for Your Visit/Call Today? Pt has diagnosis of schizoaffective disorder and substance use. He reports current suicidal ideation with plan to overdose on fentanyl . He says he has been drinking alcohol daily and smoke methamphetamines and cocaine . He also report leg pain.  How Long Has This Been Causing You Problems? > than 6 months  What Do You Feel Would Help You the Most Today? Alcohol or Drug Use Treatment; Treatment for Depression or other mood problem   Have You Recently Had Any Thoughts About Hurting Yourself? Yes  Are You Planning to Commit Suicide/Harm Yourself At This time? Yes   Flowsheet Row ED from 01/20/2024 in [facility] Department at [facility] ED from 01/19/2024 in [facility] Emergency Department at [facility] ED from 01/14/2024 in [facility]  C-SSRS RISK CATEGORY High Risk No Risk No Risk       Have you Recently Had Thoughts About Hurting Someone Mark Hammond? Yes  Are You Planning to Harm Someone at This Time? No  Explanation: Pt reports suicidal ideation with plan to overdose on fentanyl . He denies current homicidal ideation and had vague HI earlier today.   Have You Used Any Alcohol or Drugs in the Past 24 Hours? Yes  How Long Ago Did You Use Drugs or Alcohol? 01/19/2024  What Did You Use and How Much? $10 worth of methamphetamines and $40 worth of crack   Do You Currently Have a Therapist/Psychiatrist? No  Name of Therapist/Psychiatrist:    Have You Been Recently Discharged From Any Office Practice or Programs? Yes  Explanation  of Discharge From Practice/Program: Discharged from [facility] 01/17/2024     CCA Screening Triage Referral Assessment Type of Contact: Tele-Assessment  Telemedicine Service Delivery: Telemedicine service delivery: This service was provided via telemedicine using a 2-way, interactive audio and video technology  Is this Initial or Reassessment? Is this Initial or Reassessment?: Initial Assessment  Date Telepsych consult ordered in [facility]:  Date Telepsych consult ordered in [facility]: 01/20/24  Time Telepsych consult ordered in [facility]:  Time Telepsych consult ordered in [facility]: 2343  Location of Assessment: [facility] ED  Provider Location: [facility] Assessment Services   Collateral Involvement: Medical record   Does Patient Have a Automotive engineer Guardian? No  Legal Guardian Contact Information: Pt does not have a legal guardian  Copy of Legal Guardianship Form: -- (Pt does not have a legal guardian)  Legal Guardian Notified of Arrival: -- (Pt does not have a legal guardian)  Legal Guardian Notified of Pending Discharge: -- (Pt does not have a legal guardian)  If Minor and Not Living with Parent(s), Who has Custody? Pt is an adult  Is CPS involved or ever been involved? Never  Is APS involved or ever been involved? Never   Patient Determined To Be At Risk for Harm To Self or Others Based on Review of Patient Reported Information or Presenting Complaint? Yes, for Self-Harm (Pt reports suicidal ideation with plan to overdose on fentanyl . He denies current homicidal ideation and had vague HI earlier today.)  Method: Plan with intent and identified person (Pt reports suicidal ideation with plan to overdose on  fentanyl . He denies current homicidal ideation and had vague HI earlier today.)  Availability of Means: Has close by (Pt reports suicidal ideation with plan to overdose on fentanyl . He denies current homicidal ideation and had vague HI earlier today.)  Intent: Clearly intends on inflicting harm  that could cause death (Pt reports suicidal ideation with plan to overdose on fentanyl . He denies current homicidal ideation and had vague HI earlier today.)  Notification Required: No need or identified person  Additional Information for Danger to Others Potential: -- (NA)  Additional Comments for Danger to Others Potential: None  Are There Guns or Other Weapons in Your Home? No  Types of Guns/Weapons: Pt denies access to firearms  Are These Weapons Safely Secured?                            -- (Pt denies access to firearms)  Who Could Verify You Are Able To Have These Secured: Pt denies access to firearms  Do You Have any Outstanding Charges, Pending Court Dates, Parole/Probation? Pt denies current legal problems.  Contacted To Inform of Risk of Harm To Self or Others: Other: Comment (NA)    Does Patient Present under Involuntary Commitment? No    [place] of Residence: [place]   Patient Currently Receiving the Following Services: Medication Management   Determination of Need: Emergent (2 hours)   Options For Referral: Inpatient Hospitalization; [facility] Urgent Care; Medication Management; Outpatient Therapy     CCA Biopsychosocial Patient Reported Schizophrenia/Schizoaffective Diagnosis in Past: Yes   Strengths: Pt states he is willing to participate in treatment.   Mental Health Symptoms Depression:  Change in energy/activity; Difficulty Concentrating; Fatigue; Hopelessness; Irritability; Worthlessness   Duration of Depressive symptoms: Duration of Depressive Symptoms: Greater than two weeks   Mania:  None   Anxiety:   Difficulty concentrating; Fatigue; Irritability; Restlessness; Tension; Worrying   Psychosis:  Hallucinations (Pt reports hearing voices that mumble)   Duration of Psychotic symptoms: Duration of Psychotic Symptoms: Greater than six months   Trauma:  None   Obsessions:  None   Compulsions:  None   Inattention:  N/A    Hyperactivity/Impulsivity:  N/A   Oppositional/Defiant Behaviors:  None   Emotional Irregularity:  None   Other Mood/Personality Symptoms:  None noted    Mental Status Exam Appearance and self-care  Stature:  Small   Weight:  Average weight   Clothing:  Casual   Grooming:  Normal   Cosmetic use:  None   Posture/gait:  Normal   Motor activity:  Not Remarkable   Sensorium  Attention:  Normal   Concentration:  Normal   Orientation:  X5   Recall/memory:  Normal   Affect and Mood  Affect:  Depressed   Mood:  Depressed   Relating  Eye contact:  Normal   Facial expression:  Responsive   Attitude toward examiner:  Cooperative   Thought and Language  Speech flow: Normal   Thought content:  Appropriate to Mood and Circumstances   Preoccupation:  None   Hallucinations:  Auditory   Organization:  Patent examiner of Knowledge:  Average   Intelligence:  Average   Abstraction:  Normal   Judgement:  Fair   Dance movement psychotherapist:  Adequate   Insight:  Lacking   Decision Making:  Location manager   Social Functioning  Social Maturity:  Irresponsible   Social Judgement:  "Street Smart"   Stress  Stressors:  Family conflict; Illness; Housing; Financial   Coping Ability:  Exhausted   Skill Deficits:  None   Supports:  Support needed     Religion: Religion/Spirituality Are You A Religious Person?: No How Might This Affect Treatment?: NA  Leisure/Recreation: Leisure / Recreation Do You Have Hobbies?: Yes Leisure and Hobbies: Music, writing, video games, watching movies  Exercise/Diet: Exercise/Diet Do You Exercise?: No Have You Gained or Lost A Significant Amount of Weight in the Past Six Months?: No Do You Follow a Special Diet?: No Do You Have Any Trouble Sleeping?: No Explanation of Sleeping Difficulties: Pt reports sleeping 6-7 hours per night with medication.   CCA Employment/Education Employment/Work  Situation: Employment / Work Situation Employment Situation: Unemployed Patient's Job has Been Impacted by Current Illness: No Has Patient ever Been in Equities trader?: No  Education: Education Is Patient Currently Attending School?: No Last Grade Completed: 10 Did You Product manager?: No Did You Have An Individualized Education Program (IIEP): No Did You Have Any Difficulty At [employer]?: No Patient's Education Has Been Impacted by Current Illness: No   CCA Family/Childhood History Family and Relationship History: Family history Marital status: Single Does patient have children?: No  Childhood History:  Childhood History By whom was/is the patient raised?: Other (Comment) (Raised by aunts and grandmother) Did patient suffer any verbal/emotional/physical/sexual abuse as a child?: No Did patient suffer from severe childhood neglect?: No Has patient ever been sexually abused/assaulted/raped as an adolescent or adult?: No Was the patient ever a victim of a crime or a disaster?: No Witnessed domestic violence?: No Has patient been affected by domestic violence as an adult?: No       CCA Substance Use Alcohol/Drug Use: Alcohol / Drug Use Pain Medications: Denies abuse Prescriptions: Denies abuse Over the Counter: Denies abuse History of alcohol / drug use?: Yes Longest period of sobriety (when/how long): Unknown Negative Consequences of Use: Financial, Personal relationships, Work / School Withdrawal Symptoms: Tremors, Fever / Chills, Irritability Substance #1 Name of Substance 1: Alcohol 1 - Age of First Use: Adolescent 1 - Amount (size/oz): 48 ounces of wine + 160 ounces of beer 1 - Frequency: Daily 1 - Duration: Ongoing 1 - Last Use / Amount: 01/19/2024 1 - Method of Aquiring: Store purchase 1- Route of Use: Oral ingestion Substance #2 Name of Substance 2: Cocaine  (crack) 2 - Age of First Use: Unknown 2 - Amount (size/oz): $40 worth 2 - Frequency: Daily 2 -  Duration: Ongoing 2 - Last Use / Amount: 01/19/2024 2 - Method of Aquiring: Unknown 2 - Route of Substance Use: Smoke inhalation Substance #3 Name of Substance 3: Methamphetamines 3 - Age of First Use: Unknown 3 - Amount (size/oz): $10 worth 3 - Frequency: Daily 3 - Duration: Ongoing 3 - Last Use / Amount: 01/19/2024 3 - Method of Aquiring: Unknown 3 - Route of Substance Use: Smoke inhalation Substance #4 Name of Substance 4: Marijuana 4 - Age of First Use: Unknown 4 - Amount (size/oz): 1-2 blunts 4 - Frequency: 2-3 times per month 4 - Duration: Ongoing 4 - Last Use / Amount: 01/19/2024 4 - Method of Aquiring: Unknown 4 - Route of Substance Use: Smoke inhalation                 ASAM's:  Six Dimensions of Multidimensional Assessment  Dimension 1:  Acute Intoxication and/or Withdrawal Potential:   Dimension 1:  Description of individual's past and current experiences of substance use and withdrawal: Pt reports daily use of  alcohol, cocaine , and methamphetamines  Dimension 2:  Biomedical Conditions and Complications:   Dimension 2:  Description of patient's biomedical conditions and  complications: Injured leg  Dimension 3:  Emotional, Behavioral, or Cognitive Conditions and Complications:  Dimension 3:  Description of emotional, behavioral, or cognitive conditions and complications: Pt is diagnosed with schizoaffective disorder and reports current suicidal ideation with plan  Dimension 4:  Readiness to Change:  Dimension 4:  Description of Readiness to Change criteria: Pt does not identify substance use as a problem  Dimension 5:  Relapse, Continued use, or Continued Problem Potential:  Dimension 5:  Relapse, continued use, or continued problem potential critiera description: Pt does not identify substance use as a problem  Dimension 6:  Recovery/Living Environment:  Dimension 6:  Recovery/Iiving environment criteria description: Pt is homeless  ASAM Severity Score: ASAM's  Severity Rating Score: 12  ASAM Recommended Level of Treatment: ASAM Recommended Level of Treatment: Level III Residential Treatment   Substance use Disorder (SUD) Substance Use Disorder (SUD)  Checklist Symptoms of Substance Use: Continued use despite having a persistent/recurrent physical/psychological problem caused/exacerbated by use, Continued use despite persistent or recurrent social, interpersonal problems, caused or exacerbated by use, Large amounts of time spent to obtain, use or recover from the substance(s), Persistent desire or unsuccessful efforts to cut down or control use, Presence of craving or strong urge to use, Recurrent use that results in a failure to fulfill major role obligations (work, school, home), Repeated use in physically hazardous situations, Social, occupational, recreational activities given up or reduced due to use, Substance(s) often taken in larger amounts or over longer times than was intended  Recommendations for Services/Supports/Treatments: Recommendations for Services/Supports/Treatments Recommendations For Services/Supports/Treatments: Residential-Level 1  Disposition Recommendation per psychiatric provider: We recommend transfer to [facility].   DSM5 Diagnoses: Patient Active Problem List   Diagnosis Date Noted   Amphetamine use disorder, 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   Polysubstance dependence (HCC) 12/13/2023   Polysubstance abuse (HCC) 11/21/2023   Alcohol use disorder 08/04/2023   Stimulant use disorder 08/04/2023   Opioid use disorder 08/04/2023   Tobacco use disorder 08/04/2023   Cannabis use disorder 08/04/2023   Hepatitis C 03/01/2023   Anxiety state 01/14/2023   Insomnia 01/14/2023   Schizoaffective disorder (HCC) 01/13/2023   Schizophrenia (HCC) 11/15/2019   Polysubstance (including opioids) dependence, daily use (HCC)    Substance induced mood disorder (HCC) 10/24/2019   Methamphetamine use disorder, 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

## 2024-01-21 NOTE — ED Notes (Signed)
 Pt sleeping@this  time breathing even and unlabored will continue to monitor for safety

## 2024-01-21 NOTE — ED Notes (Signed)
 Pt observed and assessed sleeping on recliner breathing even and unlabored will continue to monitor for safety

## 2024-01-21 NOTE — ED Notes (Signed)
 Pt is currently asleep, resp are even and unlabored. There are no apparent s/sx of distress. No further concerns at this time.

## 2024-01-21 NOTE — ED Notes (Signed)
 Pt a transfer from [facility], presents with SI, plan to overdose on Fentanyl.  Pt admitted to [facility] approx. 3 days ago.  Pt calm & cooperative at present. Pt ambulates with assist of walker.  Comfort measures given.  Monitoring for safety.

## 2024-01-21 NOTE — ED Notes (Signed)
 Pt resting in bed, eating dinner. No s/sx of distress. No further concerns at this time.

## 2024-01-21 NOTE — Progress Notes (Signed)
   01/21/24 0310  [facility] Triage Screening (Walk-ins at [facility] only)  How Did You Hear About Us ? Hospital Discharge  What Is the Reason for Your Visit/Call Today? Pt arrived via safe transport for [facility] with complaints of SI, HI, and auditory hallucinations  How Long Has This Been Causing You Problems? <Week  Have You Recently Had Any Thoughts About Hurting Yourself? Yes  How long ago did you have thoughts about hurting yourself? Currently  Are You Planning to Commit Suicide/Harm Yourself At This time? No  Have you Recently Had Thoughts About Hurting Someone Marigene Shoulder? Yes  How long ago did you have thoughts of harming others? Currently  Are You Planning To Harm Someone At This Time? No  Physical Abuse Denies  Verbal Abuse Denies  Sexual Abuse Denies  Exploitation of patient/patient's resources Denies  Are you currently experiencing any auditory, visual or other hallucinations? Yes  Please explain the hallucinations you are currently experiencing: Audiotry command hallucinations that are influencing his HI and SI  Have You Used Any Alcohol or Drugs in the Past 24 Hours? Yes  What Did You Use and How Much? Crack/Cocaine  and Methamphetamine  Do you have any current medical co-morbidities that require immediate attention? No  Clinician description of patient physical appearance/behavior: In discomfort due to his leg pain  but cooperative  Determination of Need Urgent (48 hours)  Options For Referral [facility] Urgent Care;Inpatient Hospitalization;Facility-Based Crisis;Other: Comment  Determination of Need filed? Yes

## 2024-01-21 NOTE — ED Notes (Signed)
 Pt awake, eating lunch. Sitting in bed, no s/sx of distress. No further concerns.

## 2024-01-21 NOTE — ED Provider Notes (Signed)
 [facility] Continuous Assessment Admission H&P  Date: 01/21/24 Patient Name: Mark Hammond MRN: [id number] Chief Complaint: "I've been feeling suicidal and using a lot--I'm not okay."  Diagnoses:  Final diagnoses:  None    HPI: The patient is a 33 year old single male who presents unaccompanied to [facility] with reported concerns of suicidal ideation, homicidal ideation, substance use, and complaints of leg pain. He has a known psychiatric history that includes schizoaffective disorder, bipolar type, alcohol use disorder, and polysubstance use involving cocaine , methamphetamines, and cannabis.  Earlier today, the patient reports he experienced a fall, which he states triggered suicidal ideation with a plan to overdose on fentanyl. He acknowledges a past suicide attempt via fentanyl overdose. He describes vague homicidal thoughts earlier in the day but currently denies any intent, plan, or ongoing thoughts of harm toward others. He also denies any history of violence. He endorses auditory hallucinations, characterized as indistinct, mumbled voices that he is unable to interpret.  He reports consuming approximately 48 ounces of wine and 120 ounces of beer today and endorses daily use of crack cocaine  (approximately $40 worth) and amphetamines (approximately $10 worth). He has no current outpatient psychiatric or substance use providers.  The patient was recently admitted to [facility] from April 12 to April 16 and states he is currently taking his prescribed medications. He has a history of frequent psychiatric hospitalizations, including a three-week stay at [facility] 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  Total Time spent with patient: 30 minutes  Musculoskeletal  Strength & Muscle Tone: within normal limits Gait & Station: normal Patient leans: N/A  Psychiatric Specialty Exam  Presentation General  Appearance:  Appropriate for Environment; Disheveled  Eye Contact: Fair  Speech: Clear and Coherent; Normal Rate  Speech Volume: Normal  Handedness: Right   Mood and Affect  Mood: Euthymic  Affect: Congruent; Appropriate   Thought Process  Thought Processes: Coherent  Descriptions of Associations:Intact  Orientation:Full (Time, Place and Person)  Thought Content:Logical  Diagnosis of Schizophrenia or Schizoaffective disorder in past: Yes  Duration of Psychotic Symptoms: Greater than six months  Hallucinations:No data recorded Ideas of Reference:None  Suicidal Thoughts:No data recorded Homicidal Thoughts:No data recorded  Sensorium  Memory: Remote Good  Judgment: Fair  Insight: Fair   Art therapist  Concentration: Good  Attention Span: Good  Recall: Good  Fund of Knowledge: Good  Language: Good   Psychomotor Activity  Psychomotor Activity:No data recorded  Assets  Assets: Communication Skills; Resilience; Social Support   Sleep  Sleep:No data recorded  No data recorded  Physical Exam ROS  Blood pressure 125/81, pulse (!) 120, temperature 98.5 F (36.9 C), temperature source Oral, resp. rate 20, SpO2 99%. There is no height or weight on file to calculate BMI.  Past Psychiatric History: Polysubstance abuse   Is the patient at risk to self? Yes  Has the patient been a risk to self in the past 6 months? Yes .    Has the patient been a risk to self within the distant past? Yes   Is the patient a risk to others? No   Has the patient been a risk to others in the past 6 months? No   Has the patient been a risk to others within the distant past? No   Past Medical History:  Past Medical History:  Diagnosis Date   Alcoholic gastritis    Bipolar 1 disorder (HCC)  Dental abscess    Depression    Drug abuse (HCC)    ETOH abuse    Hallucination    Schizo affective schizophrenia (HCC)      Family History:  Family  History  Problem Relation Age of Onset   Stroke Other      Social History:  Social History   Socioeconomic History   Marital status: Single    Spouse name: Not on file   Number of children: Not on file   Years of education: Not on file   Highest education level: Not on file  Occupational History   Not on file  Tobacco Use   Smoking status: Every Day    Current packs/day: 1.00    Types: Cigarettes    Passive exposure: Current   Smokeless tobacco: Never  Vaping Use   Vaping status: Every Day   Substances: THC  Substance and Sexual Activity   Alcohol use: Yes    Comment: 4-5x 40oz beers daily   Drug use: Yes    Frequency: 7.0 times per week    Types: "Crack" cocaine , Heroin, MDMA (Ecstacy), Marijuana, Cocaine     Comment: last cocaine  use 2/24   Sexual activity: Yes  Other Topics Concern   Not on file  Social History Narrative   Pt lives in [place] with ex-girlfriend.  Pt is not followed by an outpatient psychiatric provider.   Social Drivers of Corporate investment banker Strain: Not on file  Food Insecurity: No Food Insecurity (01/14/2024)   Hunger Vital Sign    Worried About Running Out of Food in the Last Year: Never true    Ran Out of Food in the Last Year: Never true  Recent Concern: Food Insecurity - Food Insecurity Present (12/06/2023)   Hunger Vital Sign    Worried About Programme researcher, broadcasting/film/video in the Last Year: Often true    Ran Out of Food in the Last Year: Often true  Transportation Needs: Unmet Transportation Needs (01/14/2024)   PRAPARE - Administrator, Civil Service (Medical): No    Lack of Transportation (Non-Medical): Yes  Physical Activity: Not on file  Stress: Not on file  Social Connections: Not on file  Intimate Partner Violence: Not At Risk (01/14/2024)   Humiliation, Afraid, Rape, and Kick questionnaire    Fear of Current or Ex-Partner: No    Emotionally Abused: No    Physically Abused: No    Sexually Abused: No     Last Labs:   Admission on 01/20/2024, Discharged on 01/21/2024  Component Date Value Ref Range Status   Sodium 01/20/2024 139  135 - 145 mmol/L Final   Potassium 01/20/2024 3.4 (L)  3.5 - 5.1 mmol/L Final   Chloride 01/20/2024 101  98 - 111 mmol/L Final   CO2 01/20/2024 27  22 - 32 mmol/L Final   Glucose, Bld 01/20/2024 112 (H)  70 - 99 mg/dL Final   Glucose reference range applies only to samples taken after fasting for at least 8 hours.   BUN 01/20/2024 <5 (L)  6 - 20 mg/dL Final   Creatinine, Ser 01/20/2024 0.51 (L)  0.61 - 1.24 mg/dL Final   Calcium 57/84/6962 8.4 (L)  8.9 - 10.3 mg/dL Final   Total Protein 95/28/4132 7.9  6.5 - 8.1 g/dL Final   Albumin  01/20/2024 3.2 (L)  3.5 - 5.0 g/dL Final   AST 44/10/270 90 (H)  15 - 41 U/L Final   ALT 01/20/2024 86 (H)  0 -  44 U/L Final   Alkaline Phosphatase 01/20/2024 128 (H)  38 - 126 U/L Final   Total Bilirubin 01/20/2024 0.3  0.0 - 1.2 mg/dL Final   GFR, Estimated 01/20/2024 >60  >60 mL/min Final   Comment: (NOTE) Calculated using the CKD-EPI Creatinine Equation (2021)    Anion gap 01/20/2024 11  5 - 15 Final   Performed at [facility], [address] [place], [place] [address]   Alcohol, Ethyl (B) 01/20/2024 255 (H)  <10 mg/dL Final   Comment: (NOTE) For medical purposes only. Performed at [facility], [address] [place], [place] [address]    Opiates 01/20/2024 NONE DETECTED  NONE DETECTED Final   Cocaine  01/20/2024 POSITIVE (A)  NONE DETECTED Final   Benzodiazepines 01/20/2024 NONE DETECTED  NONE DETECTED Final   Amphetamines 01/20/2024 NONE DETECTED  NONE DETECTED Final   Tetrahydrocannabinol 01/20/2024 NONE DETECTED  NONE DETECTED Final   Barbiturates 01/20/2024 NONE DETECTED  NONE DETECTED Final   Comment: (NOTE) DRUG SCREEN FOR MEDICAL PURPOSES ONLY.  IF CONFIRMATION IS NEEDED FOR ANY PURPOSE, NOTIFY LAB WITHIN 5 DAYS.  LOWEST DETECTABLE LIMITS FOR URINE DRUG SCREEN Drug Class                      Cutoff (ng/mL) Amphetamine and metabolites    1000 Barbiturate and metabolites    200 Benzodiazepine                 200 Opiates and metabolites        300 Cocaine  and metabolites        300 THC                            50 Performed at [facility], [address], [place], [place] [address]    WBC 01/20/2024 11.6 (H)  4.0 - 10.5 K/uL Final   RBC 01/20/2024 4.20 (L)  4.22 - 5.81 MIL/uL Final   Hemoglobin 01/20/2024 12.4 (L)  13.0 - 17.0 g/dL Final   HCT 24/40/1027 37.7 (L)  39.0 - 52.0 % Final   MCV 01/20/2024 89.8  80.0 - 100.0 fL Final   MCH 01/20/2024 29.5  26.0 - 34.0 pg Final   MCHC 01/20/2024 32.9  30.0 - 36.0 g/dL Final   RDW 25/36/6440 14.6  11.5 - 15.5 % Final   Platelets 01/20/2024 344  150 - 400 K/uL Final   nRBC 01/20/2024 0.0  0.0 - 0.2 % Final   Neutrophils Relative % 01/20/2024 65  % Final   Neutro Abs 01/20/2024 7.6  1.7 - 7.7 K/uL Final   Lymphocytes Relative 01/20/2024 25  % Final   Lymphs Abs 01/20/2024 2.9  0.7 - 4.0 K/uL Final   Monocytes Relative 01/20/2024 9  % Final   Monocytes Absolute 01/20/2024 1.0  0.1 - 1.0 K/uL Final   Eosinophils Relative 01/20/2024 0  % Final   Eosinophils Absolute 01/20/2024 0.0  0.0 - 0.5 K/uL Final   Basophils Relative 01/20/2024 1  % Final   Basophils Absolute 01/20/2024 0.1  0.0 - 0.1 K/uL Final   Immature Granulocytes 01/20/2024 0  % Final   Abs Immature Granulocytes 01/20/2024 0.03  0.00 - 0.07 K/uL Final   Performed at [facility], [address], [place], [place] [address]  Admission on 01/14/2024, Discharged on 01/17/2024  Component Date Value Ref Range Status   SARS Coronavirus 2 by  RT PCR 01/14/2024 NEGATIVE  NEGATIVE Final   Performed at [facility], [address], [place], [place] [address]   Sodium 01/16/2024 136  135 - 145 mmol/L Final   Potassium 01/16/2024 4.0  3.5 - 5.1 mmol/L Final   Chloride 01/16/2024 100  98 - 111 mmol/L Final   CO2 01/16/2024 27   22 - 32 mmol/L Final   Glucose, Bld 01/16/2024 99  70 - 99 mg/dL Final   Glucose reference range applies only to samples taken after fasting for at least 8 hours.   BUN 01/16/2024 5 (L)  6 - 20 mg/dL Final   Creatinine, Ser 01/16/2024 0.64  0.61 - 1.24 mg/dL Final   Calcium 19/14/7829 9.2  8.9 - 10.3 mg/dL Final   GFR, Estimated 01/16/2024 >60  >60 mL/min Final   Comment: (NOTE) Calculated using the CKD-EPI Creatinine Equation (2021)    Anion gap 01/16/2024 9  5 - 15 Final   Performed at [facility], [address] [place], [place] [address]  Admission on 01/12/2024, Discharged on 01/14/2024  Component Date Value Ref Range Status   Sodium 01/12/2024 134 (L)  135 - 145 mmol/L Final   Potassium 01/12/2024 2.9 (L)  3.5 - 5.1 mmol/L Final   Chloride 01/12/2024 96 (L)  98 - 111 mmol/L Final   CO2 01/12/2024 26  22 - 32 mmol/L Final   Glucose, Bld 01/12/2024 171 (H)  70 - 99 mg/dL Final   Glucose reference range applies only to samples taken after fasting for at least 8 hours.   BUN 01/12/2024 8  6 - 20 mg/dL Final   Creatinine, Ser 01/12/2024 0.60 (L)  0.61 - 1.24 mg/dL Final   Calcium 08/65/7846 8.5 (L)  8.9 - 10.3 mg/dL Final   Total Protein 96/29/5284 7.5  6.5 - 8.1 g/dL Final   Albumin  01/12/2024 2.8 (L)  3.5 - 5.0 g/dL Final   AST 13/24/4010 49 (H)  15 - 41 U/L Final   ALT 01/12/2024 48 (H)  0 - 44 U/L Final   Alkaline Phosphatase 01/12/2024 112  38 - 126 U/L Final   Total Bilirubin 01/12/2024 0.5  0.0 - 1.2 mg/dL Final   GFR, Estimated 01/12/2024 >60  >60 mL/min Final   Comment: (NOTE) Calculated using the CKD-EPI Creatinine Equation (2021)    Anion gap 01/12/2024 12  5 - 15 Final   Performed at [facility], [address], [place], [place] [address]   Alcohol, Ethyl (B) 01/12/2024 194 (H)  <10 mg/dL Final   Comment: (NOTE) Lowest detectable limit for serum alcohol is 10 mg/dL.  For medical purposes only. Performed at [facility], [address] [place], [place] [address]    Opiates 01/12/2024 POSITIVE (A)  NONE DETECTED Final   Cocaine  01/12/2024 POSITIVE (A)  NONE DETECTED Final   Benzodiazepines 01/12/2024 NONE DETECTED  NONE DETECTED Final   Amphetamines 01/12/2024 NONE DETECTED  NONE DETECTED Final   Tetrahydrocannabinol 01/12/2024 POSITIVE (A)  NONE DETECTED Final   Barbiturates 01/12/2024 NONE DETECTED  NONE DETECTED Final   Comment: (NOTE) DRUG SCREEN FOR MEDICAL PURPOSES ONLY.  IF CONFIRMATION IS NEEDED FOR ANY PURPOSE, NOTIFY LAB WITHIN 5 DAYS.  LOWEST DETECTABLE LIMITS FOR URINE DRUG SCREEN Drug Class                     Cutoff (ng/mL) Amphetamine and metabolites    1000 Barbiturate and metabolites    200  Benzodiazepine                 200 Opiates and metabolites        300 Cocaine  and metabolites        300 THC                            50 Performed at [facility], [address] [place], [place] [address]    WBC 01/12/2024 11.3 (H)  4.0 - 10.5 K/uL Final   RBC 01/12/2024 3.99 (L)  4.22 - 5.81 MIL/uL Final   Hemoglobin 01/12/2024 11.7 (L)  13.0 - 17.0 g/dL Final   HCT 60/45/4098 36.1 (L)  39.0 - 52.0 % Final   MCV 01/12/2024 90.5  80.0 - 100.0 fL Final   MCH 01/12/2024 29.3  26.0 - 34.0 pg Final   MCHC 01/12/2024 32.4  30.0 - 36.0 g/dL Final   RDW 11/91/4782 13.6  11.5 - 15.5 % Final   Platelets 01/12/2024 477 (H)  150 - 400 K/uL Final   nRBC 01/12/2024 0.0  0.0 - 0.2 % Final   Neutrophils Relative % 01/12/2024 76  % Final   Neutro Abs 01/12/2024 8.6 (H)  1.7 - 7.7 K/uL Final   Lymphocytes Relative 01/12/2024 16  % Final   Lymphs Abs 01/12/2024 1.8  0.7 - 4.0 K/uL Final   Monocytes Relative 01/12/2024 6  % Final   Monocytes Absolute 01/12/2024 0.7  0.1 - 1.0 K/uL Final   Eosinophils Relative 01/12/2024 1  % Final   Eosinophils Absolute 01/12/2024 0.1  0.0 - 0.5 K/uL Final   Basophils Relative 01/12/2024 1  % Final   Basophils Absolute 01/12/2024 0.1   0.0 - 0.1 K/uL Final   Immature Granulocytes 01/12/2024 0  % Final   Abs Immature Granulocytes 01/12/2024 0.04  0.00 - 0.07 K/uL Final   Performed at [facility], [address] [place], [place] [address]   Sodium 01/13/2024 135  135 - 145 mmol/L Final   Potassium 01/13/2024 3.4 (L)  3.5 - 5.1 mmol/L Final   Chloride 01/13/2024 100  98 - 111 mmol/L Final   CO2 01/13/2024 28  22 - 32 mmol/L Final   Glucose, Bld 01/13/2024 106 (H)  70 - 99 mg/dL Final   Glucose reference range applies only to samples taken after fasting for at least 8 hours.   BUN 01/13/2024 8  6 - 20 mg/dL Final   Creatinine, Ser 01/13/2024 0.34 (L)  0.61 - 1.24 mg/dL Final   Calcium 30/86/5784 7.9 (L)  8.9 - 10.3 mg/dL Final   Total Protein 69/62/9528 7.1  6.5 - 8.1 g/dL Final   Albumin  01/13/2024 2.6 (L)  3.5 - 5.0 g/dL Final   AST 41/32/4401 41  15 - 41 U/L Final   ALT 01/13/2024 43  0 - 44 U/L Final   Alkaline Phosphatase 01/13/2024 125  38 - 126 U/L Final   Total Bilirubin 01/13/2024 0.3  0.0 - 1.2 mg/dL Final   GFR, Estimated 01/13/2024 >60  >60 mL/min Final   Comment: (NOTE) Calculated using the CKD-EPI Creatinine Equation (2021)    Anion gap 01/13/2024 7  5 - 15 Final   Performed at [facility], [address], [place], [place] [address]  Admission on 01/05/2024, Discharged on 01/06/2024  Component Date Value Ref Range Status   Sodium 01/05/2024 131 (L)  135 - 145 mmol/L Final  Potassium 01/05/2024 3.4 (L)  3.5 - 5.1 mmol/L Final   Chloride 01/05/2024 96 (L)  98 - 111 mmol/L Final   CO2 01/05/2024 26  22 - 32 mmol/L Final   Glucose, Bld 01/05/2024 118 (H)  70 - 99 mg/dL Final   Glucose reference range applies only to samples taken after fasting for at least 8 hours.   BUN 01/05/2024 7  6 - 20 mg/dL Final   Creatinine, Ser 01/05/2024 0.51 (L)  0.61 - 1.24 mg/dL Final   Calcium 16/07/9603 8.6 (L)  8.9 - 10.3 mg/dL Final   GFR, Estimated 01/05/2024 >60  >60 mL/min  Final   Comment: (NOTE) Calculated using the CKD-EPI Creatinine Equation (2021)    Anion gap 01/05/2024 9  5 - 15 Final   Performed at [facility], [address] [place], [place] [address]   WBC 01/05/2024 8.9  4.0 - 10.5 K/uL Final   RBC 01/05/2024 3.56 (L)  4.22 - 5.81 MIL/uL Final   Hemoglobin 01/05/2024 10.7 (L)  13.0 - 17.0 g/dL Final   HCT 11/91/4782 32.3 (L)  39.0 - 52.0 % Final   MCV 01/05/2024 90.7  80.0 - 100.0 fL Final   MCH 01/05/2024 30.1  26.0 - 34.0 pg Final   MCHC 01/05/2024 33.1  30.0 - 36.0 g/dL Final   RDW 95/62/1308 13.1  11.5 - 15.5 % Final   Platelets 01/05/2024 349  150 - 400 K/uL Final   nRBC 01/05/2024 0.0  0.0 - 0.2 % Final   Performed at [facility], [address] [place], [place] [address]   Color, Urine 01/06/2024 YELLOW  YELLOW Final   APPearance 01/06/2024 CLEAR  CLEAR Final   Specific Gravity, Urine 01/06/2024 1.013  1.005 - 1.030 Final   pH 01/06/2024 5.0  5.0 - 8.0 Final   Glucose, UA 01/06/2024 NEGATIVE  NEGATIVE mg/dL Final   Hgb urine dipstick 01/06/2024 NEGATIVE  NEGATIVE Final   Bilirubin Urine 01/06/2024 NEGATIVE  NEGATIVE Final   Ketones, ur 01/06/2024 NEGATIVE  NEGATIVE mg/dL Final   Protein, ur 69/62/9528 NEGATIVE  NEGATIVE mg/dL Final   Nitrite 41/32/4401 NEGATIVE  NEGATIVE Final   Leukocytes,Ua 01/06/2024 NEGATIVE  NEGATIVE Final   Performed at [facility], [address] [place], [place] [address]   Glucose-Capillary 01/05/2024 113 (H)  70 - 99 mg/dL Final   Glucose reference range applies only to samples taken after fasting for at least 8 hours.   Total CK 01/05/2024 116  49 - 397 U/L Final   Performed at [facility], [address] [place], [place] [address]  Admission on 12/13/2023, Discharged on 12/15/2023  Component Date Value Ref Range Status   WBC 12/13/2023 10.4  4.0 - 10.5 K/uL Final   RBC 12/13/2023 4.17 (L)  4.22 - 5.81 MIL/uL Final    Hemoglobin 12/13/2023 12.8 (L)  13.0 - 17.0 g/dL Final   HCT 03/47/4259 39.2  39.0 - 52.0 % Final   MCV 12/13/2023 94.0  80.0 - 100.0 fL Final   MCH 12/13/2023 30.7  26.0 - 34.0 pg Final   MCHC 12/13/2023 32.7  30.0 - 36.0 g/dL Final   RDW 56/38/7564 14.2  11.5 - 15.5 % Final   Platelets 12/13/2023 360  150 - 400 K/uL Final   nRBC 12/13/2023 0.0  0.0 - 0.2 % Final   Neutrophils Relative % 12/13/2023 62  % Final   Neutro Abs 12/13/2023 6.4  1.7 - 7.7 K/uL Final  Lymphocytes Relative 12/13/2023 25  % Final   Lymphs Abs 12/13/2023 2.6  0.7 - 4.0 K/uL Final   Monocytes Relative 12/13/2023 11  % Final   Monocytes Absolute 12/13/2023 1.2 (H)  0.1 - 1.0 K/uL Final   Eosinophils Relative 12/13/2023 1  % Final   Eosinophils Absolute 12/13/2023 0.2  0.0 - 0.5 K/uL Final   Basophils Relative 12/13/2023 1  % Final   Basophils Absolute 12/13/2023 0.1  0.0 - 0.1 K/uL Final   Immature Granulocytes 12/13/2023 0  % Final   Abs Immature Granulocytes 12/13/2023 0.04  0.00 - 0.07 K/uL Final   Performed at [facility], [address], [place], [place] [address]   Sodium 12/13/2023 137  135 - 145 mmol/L Final   Potassium 12/13/2023 3.9  3.5 - 5.1 mmol/L Final   Chloride 12/13/2023 104  98 - 111 mmol/L Final   CO2 12/13/2023 23  22 - 32 mmol/L Final   Glucose, Bld 12/13/2023 107 (H)  70 - 99 mg/dL Final   Glucose reference range applies only to samples taken after fasting for at least 8 hours.   BUN 12/13/2023 11  6 - 20 mg/dL Final   Creatinine, Ser 12/13/2023 0.62  0.61 - 1.24 mg/dL Final   Calcium 19/14/7829 8.8 (L)  8.9 - 10.3 mg/dL Final   Total Protein 56/21/3086 8.1  6.5 - 8.1 g/dL Final   Albumin  12/13/2023 2.9 (L)  3.5 - 5.0 g/dL Final   AST 57/84/6962 155 (H)  15 - 41 U/L Final   ALT 12/13/2023 118 (H)  0 - 44 U/L Final   Alkaline Phosphatase 12/13/2023 128 (H)  38 - 126 U/L Final   Total Bilirubin 12/13/2023 0.4  0.0 - 1.2 mg/dL Final   GFR, Estimated 12/13/2023 >60  >60  mL/min Final   Comment: (NOTE) Calculated using the CKD-EPI Creatinine Equation (2021)    Anion gap 12/13/2023 10  5 - 15 Final   Performed at [facility], [address], [place], [place] [address]   Alcohol, Ethyl (B) 12/13/2023 204 (H)  <10 mg/dL Final   Comment: (NOTE) Lowest detectable limit for serum alcohol is 10 mg/dL.  For medical purposes only. Performed at [facility], [address], [place], [place] [address]    Salicylate Lvl 12/13/2023 <7.0 (L)  7.0 - 30.0 mg/dL Final   Performed at [facility], [address] [place], [place] [address]   Acetaminophen  (Tylenol ), Serum 12/13/2023 11  10 - 30 ug/mL Final   Comment: (NOTE) Therapeutic concentrations vary significantly. A range of 10-30 ug/mL  may be an effective concentration for many patients. However, some  are best treated at concentrations outside of this range. Acetaminophen  concentrations >150 ug/mL at 4 hours after ingestion  and >50 ug/mL at 12 hours after ingestion are often associated with  toxic reactions.  Performed at [facility], [address] [place], [place] [address]    Opiates 12/13/2023 POSITIVE (A)  NONE DETECTED Final   Cocaine  12/13/2023 POSITIVE (A)  NONE DETECTED Final   Benzodiazepines 12/13/2023 POSITIVE (A)  NONE DETECTED Final   Amphetamines 12/13/2023 NONE DETECTED  NONE DETECTED Final   Tetrahydrocannabinol 12/13/2023 POSITIVE (A)  NONE DETECTED Final   Barbiturates 12/13/2023 NONE DETECTED  NONE DETECTED Final   Comment: (NOTE) DRUG SCREEN FOR MEDICAL PURPOSES ONLY.  IF CONFIRMATION IS NEEDED FOR ANY PURPOSE, NOTIFY LAB WITHIN 5 DAYS.  LOWEST DETECTABLE LIMITS FOR URINE DRUG  SCREEN Drug Class                     Cutoff (ng/mL) Amphetamine and metabolites    1000 Barbiturate and metabolites    200 Benzodiazepine                 200 Opiates and metabolites        300 Cocaine  and metabolites         300 THC                            50 Performed at [facility], [address] [place], [place] [address]   Admission on 12/08/2023, Discharged on 12/12/2023  Component Date Value Ref Range Status   Opiates 12/08/2023 NONE DETECTED  NONE DETECTED Final   Cocaine  12/08/2023 NONE DETECTED  NONE DETECTED Final   Benzodiazepines 12/08/2023 POSITIVE (A)  NONE DETECTED Final   Amphetamines 12/08/2023 NONE DETECTED  NONE DETECTED Final   Tetrahydrocannabinol 12/08/2023 POSITIVE (A)  NONE DETECTED Final   Barbiturates 12/08/2023 NONE DETECTED  NONE DETECTED Final   Comment: (NOTE) DRUG SCREEN FOR MEDICAL PURPOSES ONLY.  IF CONFIRMATION IS NEEDED FOR ANY PURPOSE, NOTIFY LAB WITHIN 5 DAYS.  LOWEST DETECTABLE LIMITS FOR URINE DRUG SCREEN Drug Class                     Cutoff (ng/mL) Amphetamine and metabolites    1000 Barbiturate and metabolites    200 Benzodiazepine                 200 Opiates and metabolites        300 Cocaine  and metabolites        300 THC                            50 Performed at [facility], [address] [place], [place] [address]   Admission on 12/06/2023, Discharged on 12/08/2023  Component Date Value Ref Range Status   Alcohol, Ethyl (B) 12/06/2023 <10  <10 mg/dL Final   Comment: (NOTE) Lowest detectable limit for serum alcohol is 10 mg/dL.  For medical purposes only. Performed at [facility], [address] [place], [place] [address]    POC Amphetamine UR 12/06/2023 None Detected  NONE DETECTED (Cut Off Level 1000 ng/mL) Final   POC Secobarbital (BAR) 12/06/2023 None Detected  NONE DETECTED (Cut Off Level 300 ng/mL) Final   POC Buprenorphine (BUP) 12/06/2023 None Detected  NONE DETECTED (Cut Off Level 10 ng/mL) Final   POC Oxazepam (BZO) 12/06/2023 Positive (A)  NONE DETECTED (Cut Off Level 300 ng/mL) Final   POC Cocaine  UR 12/06/2023 Positive (A)  NONE DETECTED (Cut Off Level 300 ng/mL) Final   POC Methamphetamine  UR 12/06/2023 None Detected  NONE DETECTED (Cut Off Level 1000 ng/mL) Final   POC Morphine  12/06/2023 None Detected  NONE DETECTED (Cut Off Level 300 ng/mL) Final   POC Methadone UR 12/06/2023 None Detected  NONE DETECTED (Cut Off Level 300 ng/mL) Final   POC Oxycodone  UR 12/06/2023 None Detected  NONE DETECTED (Cut Off Level 100 ng/mL) Final   POC Marijuana UR 12/06/2023 Positive (A)  NONE DETECTED (Cut Off Level 50 ng/mL) Final   WBC 12/08/2023 4.4  4.0 - 10.5 K/uL Final   RBC 12/08/2023 3.53 (L)  4.22 - 5.81 MIL/uL Final  Hemoglobin 12/08/2023 11.2 (L)  13.0 - 17.0 g/dL Final   HCT 40/98/1191 32.6 (L)  39.0 - 52.0 % Final   MCV 12/08/2023 92.4  80.0 - 100.0 fL Final   MCH 12/08/2023 31.7  26.0 - 34.0 pg Final   MCHC 12/08/2023 34.4  30.0 - 36.0 g/dL Final   RDW 47/82/9562 13.9  11.5 - 15.5 % Final   Platelets 12/08/2023 280  150 - 400 K/uL Final   nRBC 12/08/2023 0.0  0.0 - 0.2 % Final   Neutrophils Relative % 12/08/2023 59  % Final   Neutro Abs 12/08/2023 2.6  1.7 - 7.7 K/uL Final   Lymphocytes Relative 12/08/2023 27  % Final   Lymphs Abs 12/08/2023 1.2  0.7 - 4.0 K/uL Final   Monocytes Relative 12/08/2023 9  % Final   Monocytes Absolute 12/08/2023 0.4  0.1 - 1.0 K/uL Final   Eosinophils Relative 12/08/2023 3  % Final   Eosinophils Absolute 12/08/2023 0.1  0.0 - 0.5 K/uL Final   Basophils Relative 12/08/2023 1  % Final   Basophils Absolute 12/08/2023 0.1  0.0 - 0.1 K/uL Final   Immature Granulocytes 12/08/2023 1  % Final   Abs Immature Granulocytes 12/08/2023 0.02  0.00 - 0.07 K/uL Final   Performed at [facility], [address], [place], [place] [address]   Sodium 12/08/2023 139  135 - 145 mmol/L Final   Potassium 12/08/2023 4.0  3.5 - 5.1 mmol/L Final   Chloride 12/08/2023 100  98 - 111 mmol/L Final   CO2 12/08/2023 27  22 - 32 mmol/L Final   Glucose, Bld 12/08/2023 114 (H)  70 - 99 mg/dL Final   Glucose reference range applies only to samples taken after fasting for  at least 8 hours.   BUN 12/08/2023 9  6 - 20 mg/dL Final   Creatinine, Ser 12/08/2023 0.60 (L)  0.61 - 1.24 mg/dL Final   Calcium 57/84/6962 8.7 (L)  8.9 - 10.3 mg/dL Final   Total Protein 95/28/4132 6.2 (L)  6.5 - 8.1 g/dL Final   Albumin  12/08/2023 2.3 (L)  3.5 - 5.0 g/dL Final   AST 44/10/270 101 (H)  15 - 41 U/L Final   ALT 12/08/2023 55 (H)  0 - 44 U/L Final   Alkaline Phosphatase 12/08/2023 125  38 - 126 U/L Final   Total Bilirubin 12/08/2023 0.4  0.0 - 1.2 mg/dL Final   GFR, Estimated 12/08/2023 >60  >60 mL/min Final   Comment: (NOTE) Calculated using the CKD-EPI Creatinine Equation (2021)    Anion gap 12/08/2023 12  5 - 15 Final   Performed at [facility], [address], [place], [place] [address]   Cholesterol 12/08/2023 96  0 - 200 mg/dL Final   Triglycerides 40/34/7425 55  <150 mg/dL Final   HDL 95/63/8756 34 (L)  >40 mg/dL Final   Total CHOL/HDL Ratio 12/08/2023 2.8  RATIO Final   VLDL 12/08/2023 11  0 - 40 mg/dL Final   LDL Cholesterol 12/08/2023 51  0 - 99 mg/dL Final   Comment:        Total Cholesterol/HDL:CHD Risk Coronary Heart Disease Risk Table                     Men   Women  1/2 Average Risk   3.4   3.3  Average Risk       5.0   4.4  2 X Average Risk   9.6  7.1  3 X Average Risk  23.4   11.0        Use the calculated Patient Ratio above and the CHD Risk Table to determine the patient's CHD Risk.        ATP III CLASSIFICATION (LDL):  <100     mg/dL   Optimal  841-324  mg/dL   Near or Above                    Optimal  130-159  mg/dL   Borderline  401-027  mg/dL   High  >253     mg/dL   Very High Performed at [facility], [address] [place], [place] [address]    Magnesium  12/08/2023 1.7  1.7 - 2.4 mg/dL Final   Performed at [facility], [address], [place], [place] [address]   TSH 12/08/2023 0.714  0.350 - 4.500 uIU/mL Final   Comment: Performed by a 3rd Generation assay with a functional sensitivity of <=0.01  uIU/mL. Performed at [facility], [address], [place], [place] [address]    Hgb A1c MFr Bld 12/08/2023 6.0 (H)  4.8 - 5.6 % Final   Comment: (NOTE) Pre diabetes:          5.7%-6.4%  Diabetes:              >6.4%  Glycemic control for   <7.0% adults with diabetes    Mean Plasma Glucose 12/08/2023 125.5  mg/dL Final   Performed at [facility], [address], [place], [place] [address]  Admission on 12/04/2023, Discharged on 12/04/2023  Component Date Value Ref Range Status   Color, Urine 12/04/2023 YELLOW  YELLOW Final   APPearance 12/04/2023 CLEAR  CLEAR Final   Specific Gravity, Urine 12/04/2023 1.029  1.005 - 1.030 Final   pH 12/04/2023 5.0  5.0 - 8.0 Final   Glucose, UA 12/04/2023 NEGATIVE  NEGATIVE mg/dL Final   Hgb urine dipstick 12/04/2023 NEGATIVE  NEGATIVE Final   Bilirubin Urine 12/04/2023 NEGATIVE  NEGATIVE Final   Ketones, ur 12/04/2023 NEGATIVE  NEGATIVE mg/dL Final   Protein, ur 33/29/5188 NEGATIVE  NEGATIVE mg/dL Final   Nitrite 41/66/0630 NEGATIVE  NEGATIVE Final   Leukocytes,Ua 12/04/2023 NEGATIVE  NEGATIVE Final   Performed at [facility], [address] [place], [place] [address]  Admission on 11/27/2023, Discharged on 11/28/2023  Component Date Value Ref Range Status   WBC 11/27/2023 13.5 (H)  4.0 - 10.5 K/uL Final   RBC 11/27/2023 4.12 (L)  4.22 - 5.81 MIL/uL Final   Hemoglobin 11/27/2023 12.8 (L)  13.0 - 17.0 g/dL Final   HCT 93/23/5573 38.4 (L)  39.0 - 52.0 % Final   MCV 11/27/2023 93.2  80.0 - 100.0 fL Final   MCH 11/27/2023 31.1  26.0 - 34.0 pg Final   MCHC 11/27/2023 33.3  30.0 - 36.0 g/dL Final   RDW 22/11/5425 13.4  11.5 - 15.5 % Final   Platelets 11/27/2023 375  150 - 400 K/uL Final   nRBC 11/27/2023 0.0  0.0 - 0.2 % Final   Performed at [facility], [address] [place], [place] [address]   Sodium 11/27/2023 132 (L)  135 - 145 mmol/L Final   Potassium 11/27/2023 4.3  3.5 - 5.1 mmol/L Final    Chloride 11/27/2023 95 (L)  98 - 111 mmol/L Final   CO2 11/27/2023 21 (L)  22 - 32 mmol/L Final  Glucose, Bld 11/27/2023 113 (H)  70 - 99 mg/dL Final   Glucose reference range applies only to samples taken after fasting for at least 8 hours.   BUN 11/27/2023 15  6 - 20 mg/dL Final   Creatinine, Ser 11/27/2023 0.86  0.61 - 1.24 mg/dL Final   Calcium 69/62/9528 9.5  8.9 - 10.3 mg/dL Final   GFR, Estimated 11/27/2023 >60  >60 mL/min Final   Comment: (NOTE) Calculated using the CKD-EPI Creatinine Equation (2021)    Anion gap 11/27/2023 16 (H)  5 - 15 Final   Performed at [facility], [address], [place], [place] [address]   Salicylate Lvl 11/27/2023 <7.0 (L)  7.0 - 30.0 mg/dL Final   Performed at [facility], [address] [place], [place] [address]   Acetaminophen  (Tylenol ), Serum 11/27/2023 10  10 - 30 ug/mL Final   Comment: (NOTE) Therapeutic concentrations vary significantly. A range of 10-30 ug/mL  may be an effective concentration for many patients. However, some  are best treated at concentrations outside of this range. Acetaminophen  concentrations >150 ug/mL at 4 hours after ingestion  and >50 ug/mL at 12 hours after ingestion are often associated with  toxic reactions.  Performed at [facility], [address], [place], [place] [address]    Alcohol, Ethyl (B) 11/27/2023 127 (H)  <10 mg/dL Final   Comment: (NOTE) Lowest detectable limit for serum alcohol is 10 mg/dL.  For medical purposes only. Performed at [facility], [address], [place], [place] [address]    Opiates 11/27/2023 POSITIVE (A)  NONE DETECTED Final   Cocaine  11/27/2023 POSITIVE (A)  NONE DETECTED Final   Benzodiazepines 11/27/2023 POSITIVE (A)  NONE DETECTED Final   Amphetamines 11/27/2023 NONE DETECTED  NONE DETECTED Final   Tetrahydrocannabinol 11/27/2023 POSITIVE (A)  NONE DETECTED Final   Barbiturates 11/27/2023 NONE DETECTED  NONE DETECTED Final   Comment:  (NOTE) DRUG SCREEN FOR MEDICAL PURPOSES ONLY.  IF CONFIRMATION IS NEEDED FOR ANY PURPOSE, NOTIFY LAB WITHIN 5 DAYS.  LOWEST DETECTABLE LIMITS FOR URINE DRUG SCREEN Drug Class                     Cutoff (ng/mL) Amphetamine and metabolites    1000 Barbiturate and metabolites    200 Benzodiazepine                 200 Opiates and metabolites        300 Cocaine  and metabolites        300 THC                            50 Performed at [facility], [address] [place], [place] [address]    Color, Urine 11/27/2023 AMBER (A)  YELLOW Final   BIOCHEMICALS MAY BE AFFECTED BY COLOR   APPearance 11/27/2023 CLEAR  CLEAR Final   Specific Gravity, Urine 11/27/2023 1.020  1.005 - 1.030 Final   pH 11/27/2023 5.0  5.0 - 8.0 Final   Glucose, UA 11/27/2023 NEGATIVE  NEGATIVE mg/dL Final   Hgb urine dipstick 11/27/2023 NEGATIVE  NEGATIVE Final   Bilirubin Urine 11/27/2023 NEGATIVE  NEGATIVE Final   Ketones, ur 11/27/2023 NEGATIVE  NEGATIVE mg/dL Final   Protein, ur 95/63/8756 NEGATIVE  NEGATIVE mg/dL Final   Nitrite 43/32/9518 NEGATIVE  NEGATIVE Final   Leukocytes,Ua 11/27/2023 NEGATIVE  NEGATIVE Final   Performed at  [facility], [address], [place], [place] [address]   Neisseria Gonorrhea 11/27/2023 Negative   Final   Chlamydia 11/27/2023 Negative   Final   Comment 11/27/2023 Normal Reference Ranger Chlamydia - Negative   Final   Comment 11/27/2023 Normal Reference Range Neisseria Gonorrhea - Negative   Final  Admission on 11/21/2023, Discharged on 11/27/2023  Component Date Value Ref Range Status   SARS Coronavirus 2 by RT PCR 11/25/2023 NEGATIVE  NEGATIVE Final   Performed at [facility], [address] [place], [place] [address]   WBC 11/21/2023 5.6  4.0 - 10.5 K/uL Final   RBC 11/21/2023 4.14 (L)  4.22 - 5.81 MIL/uL Final   Hemoglobin 11/21/2023 13.1  13.0 - 17.0 g/dL Final   HCT 09/81/1914 38.8 (L)  39.0 - 52.0 % Final   MCV 11/21/2023 93.7  80.0 - 100.0 fL  Final   MCH 11/21/2023 31.6  26.0 - 34.0 pg Final   MCHC 11/21/2023 33.8  30.0 - 36.0 g/dL Final   RDW 78/29/5621 13.2  11.5 - 15.5 % Final   Platelets 11/21/2023 298  150 - 400 K/uL Final   nRBC 11/21/2023 0.0  0.0 - 0.2 % Final   Neutrophils Relative % 11/21/2023 83  % Final   Neutro Abs 11/21/2023 4.6  1.7 - 7.7 K/uL Final   Lymphocytes Relative 11/21/2023 14  % Final   Lymphs Abs 11/21/2023 0.8  0.7 - 4.0 K/uL Final   Monocytes Relative 11/21/2023 3  % Final   Monocytes Absolute 11/21/2023 0.2  0.1 - 1.0 K/uL Final   Eosinophils Relative 11/21/2023 0  % Final   Eosinophils Absolute 11/21/2023 0.0  0.0 - 0.5 K/uL Final   Basophils Relative 11/21/2023 0  % Final   Basophils Absolute 11/21/2023 0.0  0.0 - 0.1 K/uL Final   Immature Granulocytes 11/21/2023 0  % Final   Abs Immature Granulocytes 11/21/2023 0.01  0.00 - 0.07 K/uL Final   Performed at [facility], [address], [place], [place] [address]   Sodium 11/21/2023 135  135 - 145 mmol/L Final   Potassium 11/21/2023 4.2  3.5 - 5.1 mmol/L Final   Chloride 11/21/2023 99  98 - 111 mmol/L Final   CO2 11/21/2023 26  22 - 32 mmol/L Final   Glucose, Bld 11/21/2023 162 (H)  70 - 99 mg/dL Final   Glucose reference range applies only to samples taken after fasting for at least 8 hours.   BUN 11/21/2023 7  6 - 20 mg/dL Final   Creatinine, Ser 11/21/2023 0.63  0.61 - 1.24 mg/dL Final   Calcium 78/46/9629 8.9  8.9 - 10.3 mg/dL Final   Total Protein 52/84/1324 8.1  6.5 - 8.1 g/dL Final   Albumin 40/07/2724 2.8 (L)  3.5 - 5.0 g/dL Final   AST 36/64/4034 91 (H)  15 - 41 U/L Final   ALT 11/21/2023 55 (H)  0 - 44 U/L Final   Alkaline Phosphatase 11/21/2023 87  38 - 126 U/L Final   Total Bilirubin 11/21/2023 0.8  0.0 - 1.2 mg/dL Final   GFR, Estimated 11/21/2023 >60  >60 mL/min Final   Comment: (NOTE) Calculated using the CKD-EPI Creatinine Equation (2021)    Anion gap 11/21/2023 10  5 - 15 Final   Performed at [facility], [address] [place], [place] [address]   Alcohol, Ethyl (B) 11/21/2023 <10  <10 mg/dL Final   Comment: (NOTE) Lowest detectable limit  for serum alcohol is 10 mg/dL.  For medical purposes only. Performed at [facility], [address] [place], [place] [address]    TSH 11/21/2023 0.365  0.350 - 4.500 uIU/mL Final   Comment: Performed by a 3rd Generation assay with a functional sensitivity of <=0.01 uIU/mL. Performed at [facility], [address], [place], [place] [address]    POC Amphetamine UR 11/22/2023 None Detected  NONE DETECTED (Cut Off Level 1000 ng/mL) Final   POC Secobarbital (BAR) 11/22/2023 None Detected  NONE DETECTED (Cut Off Level 300 ng/mL) Final   POC Buprenorphine (BUP) 11/22/2023 None Detected  NONE DETECTED (Cut Off Level 10 ng/mL) Final   POC Oxazepam (BZO) 11/22/2023 Positive (A)  NONE DETECTED (Cut Off Level 300 ng/mL) Final   POC Cocaine  UR 11/22/2023 Positive (A)  NONE DETECTED (Cut Off Level 300 ng/mL) Final   POC Methamphetamine UR 11/22/2023 None Detected  NONE DETECTED (Cut Off Level 1000 ng/mL) Final   POC Morphine 11/22/2023 None Detected  NONE DETECTED (Cut Off Level 300 ng/mL) Final   POC Methadone UR 11/22/2023 None Detected  NONE DETECTED (Cut Off Level 300 ng/mL) Final   POC Oxycodone UR 11/22/2023 None Detected  NONE DETECTED (Cut Off Level 100 ng/mL) Final   POC Marijuana UR 11/22/2023 Positive (A)  NONE DETECTED (Cut Off Level 50 ng/mL) Final   Hgb A1c MFr Bld 11/25/2023 5.9 (H)  4.8 - 5.6 % Final   Comment: (NOTE) Pre diabetes:          5.7%-6.4%  Diabetes:              >6.4%  Glycemic control for   <7.0% adults with diabetes    Mean Plasma Glucose 11/25/2023 122.63  mg/dL Final   Performed at [facility], [address] [place], [place] [address]   Sodium 11/25/2023 136  135 - 145 mmol/L Final   Potassium 11/25/2023 4.2  3.5 - 5.1 mmol/L Final   Chloride 11/25/2023 101  98 - 111 mmol/L Final   CO2 11/25/2023 27  22  - 32 mmol/L Final   Glucose, Bld 11/25/2023 108 (H)  70 - 99 mg/dL Final   Glucose reference range applies only to samples taken after fasting for at least 8 hours.   BUN 11/25/2023 9  6 - 20 mg/dL Final   Creatinine, Ser 11/25/2023 0.68  0.61 - 1.24 mg/dL Final   Calcium 91/47/8295 8.6 (L)  8.9 - 10.3 mg/dL Final   Total Protein 62/13/0865 7.0  6.5 - 8.1 g/dL Final   Albumin 78/46/9629 2.3 (L)  3.5 - 5.0 g/dL Final   AST 52/84/1324 81 (H)  15 - 41 U/L Final   ALT 11/25/2023 58 (H)  0 - 44 U/L Final   Alkaline Phosphatase 11/25/2023 84  38 - 126 U/L Final   Total Bilirubin 11/25/2023 0.7  0.0 - 1.2 mg/dL Final   GFR, Estimated 11/25/2023 >60  >60 mL/min Final   Comment: (NOTE) Calculated using the CKD-EPI Creatinine Equation (2021)    Anion gap 11/25/2023 8  5 - 15 Final   Performed at [facility], [address] [place], [place] [address]   Iron 11/25/2023 21 (L)  45 - 182 ug/dL Final   TIBC 72/53/6644 280  250 - 450 ug/dL Final   Saturation Ratios 11/25/2023 8 (L)  17.9 - 39.5 % Final   UIBC 11/25/2023 259  ug/dL Final   Performed at [facility],  [address], [place], [place] [address]   Vitamin B-12 11/25/2023 549  180 - 914 pg/mL Final   Comment: (NOTE) This assay is not validated for testing neonatal or myeloproliferative syndrome specimens for Vitamin B12 levels. Performed at [facility], [address] [place], [place] [address]    Ferritin 11/25/2023 217  24 - 336 ng/mL Final   Performed at [facility], [address] [place], [place] [address]  There may be more visits with results that are not included.    Allergies: Codeine  Medications:  Facility Ordered Medications  Medication   [COMPLETED] lactated ringers  bolus 1,000 mL   [COMPLETED] lactated ringers  bolus 1,000 mL   thiamine  (VITAMIN B1) injection 100 mg   [START ON 01/22/2024] thiamine  (VITAMIN B1) tablet 100 mg   multivitamin with minerals tablet 1 tablet   LORazepam   (ATIVAN ) tablet 1 mg   hydrOXYzine  (ATARAX ) tablet 25 mg   loperamide  (IMODIUM ) capsule 2-4 mg   ondansetron  (ZOFRAN -ODT) disintegrating tablet 4 mg   LORazepam  (ATIVAN ) tablet 1 mg   Followed by   Cecily Cohen ON 01/22/2024] LORazepam  (ATIVAN ) tablet 1 mg   Followed by   Cecily Cohen ON 01/23/2024] LORazepam  (ATIVAN ) tablet 1 mg   Followed by   Cecily Cohen ON 01/24/2024] LORazepam  (ATIVAN ) tablet 1 mg   acetaminophen  (TYLENOL ) tablet 650 mg   alum & mag hydroxide-simeth (MAALOX/MYLANTA) 200-200-20 MG/5ML suspension 30 mL   magnesium  hydroxide (MILK OF MAGNESIA) suspension 30 mL   haloperidol  lactate (HALDOL ) injection 5 mg   And   diphenhydrAMINE  (BENADRYL ) injection 50 mg   And   LORazepam  (ATIVAN ) injection 2 mg   haloperidol  (HALDOL ) tablet 5 mg   And   diphenhydrAMINE  (BENADRYL ) capsule 50 mg   haloperidol  lactate (HALDOL ) injection 10 mg   And   diphenhydrAMINE  (BENADRYL ) injection 50 mg   And   LORazepam  (ATIVAN ) injection 2 mg   hydrOXYzine  (ATARAX ) tablet 25 mg   traZODone  (DESYREL ) tablet 50 mg   PTA Medications  Medication Sig   thiamine  (VITAMIN B-1) 100 MG tablet Take 1 tablet (100 mg total) by mouth daily. (Patient not taking: Reported on 01/13/2024)   Multiple Vitamin (MULTIVITAMIN WITH MINERALS) TABS tablet Take 1 tablet by mouth daily. (Patient not taking: Reported on 01/13/2024)   nicotine  (NICODERM CQ ) 14 mg/24hr patch Place 1 patch (14 mg total) onto the skin daily.   traZODone  (DESYREL ) 50 MG tablet Take 1 tablet (50 mg total) by mouth at bedtime as needed and may repeat dose one time if needed for sleep.   ferrous sulfate  325 (65 FE) MG tablet Take 1 tablet (325 mg total) by mouth daily with breakfast.   gabapentin  (NEURONTIN ) 300 MG capsule Take 1 capsule (300 mg total) by mouth 2 (two) times daily.      Medical Decision Making  Patient will be monitored overnight and placed on CIWA protocol for potential alcohol withdrawals    Recommendations  Based on my evaluation  the patient does not appear to have an emergency medical condition. Patient to be reassessed in the am  Al Alias, NP 01/21/24  3:34 AM

## 2024-01-22 ENCOUNTER — Emergency Department (HOSPITAL_COMMUNITY)
Admission: EM | Admit: 2024-01-22 | Discharge: 2024-01-23 | Disposition: A | Discharge status: Home / Self Care | Payer: MEDICAID | Attending: Emergency Medicine | Admitting: Emergency Medicine

## 2024-01-22 ENCOUNTER — Encounter (HOSPITAL_COMMUNITY): Payer: Self-pay | Admitting: Emergency Medicine

## 2024-01-22 ENCOUNTER — Ambulatory Visit (HOSPITAL_COMMUNITY): Payer: MEDICAID

## 2024-01-22 DIAGNOSIS — R451 Restlessness and agitation: Secondary | ICD-10-CM | POA: Insufficient documentation

## 2024-01-22 DIAGNOSIS — F1994 Other psychoactive substance use, unspecified with psychoactive substance-induced mood disorder: Secondary | ICD-10-CM | POA: Diagnosis not present

## 2024-01-22 DIAGNOSIS — Y905 Blood alcohol level of 100-119 mg/100 ml: Secondary | ICD-10-CM | POA: Diagnosis not present

## 2024-01-22 DIAGNOSIS — M79604 Pain in right leg: Secondary | ICD-10-CM | POA: Diagnosis present

## 2024-01-22 DIAGNOSIS — F99 Mental disorder, not otherwise specified: Secondary | ICD-10-CM

## 2024-01-22 DIAGNOSIS — R45851 Suicidal ideations: Secondary | ICD-10-CM

## 2024-01-22 DIAGNOSIS — F1914 Other psychoactive substance abuse with psychoactive substance-induced mood disorder: Secondary | ICD-10-CM | POA: Diagnosis not present

## 2024-01-22 DIAGNOSIS — M79605 Pain in left leg: Secondary | ICD-10-CM | POA: Diagnosis not present

## 2024-01-22 DIAGNOSIS — Z79899 Other long term (current) drug therapy: Secondary | ICD-10-CM | POA: Diagnosis not present

## 2024-01-22 LAB — COMPREHENSIVE METABOLIC PANEL WITH GFR
ALT: 75 U/L — ABNORMAL HIGH (ref 0–44)
AST: 83 U/L — ABNORMAL HIGH (ref 15–41)
Albumin: 3 g/dL — ABNORMAL LOW (ref 3.5–5.0)
Alkaline Phosphatase: 123 U/L (ref 38–126)
Anion gap: 12 (ref 5–15)
BUN: 6 mg/dL (ref 6–20)
CO2: 22 mmol/L (ref 22–32)
Calcium: 8.7 mg/dL — ABNORMAL LOW (ref 8.9–10.3)
Chloride: 101 mmol/L (ref 98–111)
Creatinine, Ser: 0.74 mg/dL (ref 0.61–1.24)
GFR, Estimated: >60 mL/min (ref >60)
Glucose, Bld: 147 mg/dL — ABNORMAL HIGH (ref 70–99)
Potassium: 3.2 mmol/L — ABNORMAL LOW (ref 3.5–5.1)
Sodium: 135 mmol/L (ref 135–145)
Total Bilirubin: 0.5 mg/dL (ref 0.0–1.2)
Total Protein: 7.9 g/dL (ref 6.5–8.1)

## 2024-01-22 LAB — CBC
HCT: 36.3 % — ABNORMAL LOW (ref 39.0–52.0)
Hemoglobin: 11.9 g/dL — ABNORMAL LOW (ref 13.0–17.0)
MCH: 29.2 pg (ref 26.0–34.0)
MCHC: 32.8 g/dL (ref 30.0–36.0)
MCV: 89.2 fL (ref 80.0–100.0)
Platelets: 328 10*3/uL (ref 150–400)
RBC: 4.07 MIL/uL — ABNORMAL LOW (ref 4.22–5.81)
RDW: 14.6 % (ref 11.5–15.5)
WBC: 10.4 10*3/uL (ref 4.0–10.5)
nRBC: 0 % (ref 0.0–0.2)

## 2024-01-22 LAB — RAPID URINE DRUG SCREEN, HOSP PERFORMED
Amphetamines: NOT DETECTED
Barbiturates: NOT DETECTED
Benzodiazepines: POSITIVE — AB
Cocaine: POSITIVE — AB
Opiates: NOT DETECTED
Tetrahydrocannabinol: POSITIVE — AB

## 2024-01-22 LAB — SALICYLATE LEVEL: Salicylate Lvl: <7 mg/dL — ABNORMAL LOW (ref 7.0–30.0)

## 2024-01-22 LAB — ACETAMINOPHEN LEVEL: Acetaminophen (Tylenol), Serum: 71 ug/mL — ABNORMAL HIGH (ref 10–30)

## 2024-01-22 LAB — ETHANOL: Alcohol, Ethyl (B): 107 mg/dL — ABNORMAL HIGH (ref <10)

## 2024-01-22 NOTE — ED Triage Notes (Signed)
 PT states he wants to IVC himself for suicidal thoughts denies a current plan. Pt is tired of going to [facility] they just prescribe new medications that aren't working. PT wants to go somewhere that he doesn't have to talk to a lot of people and doesn't want his family to know where he is going.

## 2024-01-22 NOTE — ED Provider Triage Note (Signed)
 Emergency Medicine Provider Triage Evaluation Note  Eunice Oldaker , a 33 y.o. male  was evaluated in triage.  Pt complains of wanting to kill himself by OD on fentanyl.  Patient reports that he has been dealing with suicidal ideations for the past several days, he was seen recently in our emergency department, he reports that every time he goes to be hooked a give him his same home medications that are not working.  He endorses crack cocaine  use earlier today.  He denies any alcohol use.  He is limping when I first noted him, he has documented left leg pain with multiple evaluations in the emergency department since February of this year, reports that the pain is not different than his baseline..  Review of Systems  Positive: Suicidal ideation Negative: Homicidal ideation, AVH  Physical Exam  BP 104/72 (BP Location: Left Arm)   Pulse (!) 110   Temp 98.3 F (36.8 C)   Resp 16   SpO2 96%  Gen:   Awake, no distress   Resp:  Normal effort  MSK:   Moves extremities without difficulty  Other:  Mild tachycardia  Medical Decision Making  Medically screening exam initiated at 10:53 PM.  Appropriate orders placed.  Jaelon Gatley was informed that the remainder of the evaluation will be completed by another provider, this initial triage assessment does not replace that evaluation, and the importance of remaining in the ED until their evaluation is complete.  Workup initiated in triage Here voluntarily, does not need IVC at this time   Nelly Banco, Kirby Peoples 01/22/24 2255

## 2024-01-22 NOTE — Discharge Instructions (Addendum)
 Discharge recommendations:   Medications: Patient is to take medications as prescribed. The patient or patient's guardian is to contact a medical professional and/or outpatient provider to address any new side effects that develop. The patient or the patient's guardian should update outpatient providers of any new medications and/or medication changes.    Outpatient Follow up: Please review list of outpatient resources for psychiatry and counseling. Please follow up with your primary care provider for all medical related needs.    Therapy: We recommend that patient participate in individual therapy to address mental health concerns.   Atypical antipsychotics: If you are prescribed an atypical antipsychotic, it is recommended that your height, weight, BMI, blood pressure, fasting lipid panel, and fasting blood sugar be monitored by your outpatient providers.  Safety:   The following safety precautions should be taken:   No sharp objects. This includes scissors, razors, scrapers, and putty knives.   Chemicals should be removed and locked up.   Medications should be removed and locked up.   Weapons should be removed and locked up. This includes firearms, knives and instruments that can be used to cause injury.   The patient should abstain from use of illicit substances/drugs and abuse of any medications.  If symptoms worsen or do not continue to improve or if the patient becomes actively suicidal or homicidal then it is recommended that the patient return to the closest hospital emergency department, the [facility], or call 911 for further evaluation and treatment. National Suicide Prevention Lifeline 1-800-SUICIDE or 437-023-8796.  About 988 988 offers 24/7 access to trained crisis counselors who can help people experiencing mental health-related distress. People can call or text 988 or chat 988lifeline.org for themselves or if they are worried about a  loved one who may need crisis support. Substance Abuse Treatment Programs  Intensive Outpatient Programs [facility]     [address]      [place], [place]                   [phone]       [facility] [address] [place] [address] [place], [place] [phone]  [facility]     (Inpatient and outpatient)     [address]           [phone]    [facility] [phone] (Suboxone and Methadone)  [address]      [place], [place] [address]      [phone]       [address] [place], [place] [phone]  [facility] (Outpatient/Inpatient, [facility])    (insurance only) [phone]             [facility] (Groups & Residential) [place], [place] [phone]     [facility]     [address]     [place], [place]      [phone]       [facility] (for caregivers and family) [phone] [address] Laurell Joseph[address] [place], [place] [phone]      Residential Treatment Programs [facility]      [address] [place], [place] [address]  [phone]       [facility] [address], [place], [place] [address] [phone]  [facility]        [phone]       [facility] [phone]  [facility] ([facility]  [address]                                         [place], [place]                                                [phone] or [phone]                               [facility] [address] [place], [address] [phone]  [facility]    [address]      [place], [place]     [phone]       [facility] [address] [place], [place] [phone]  [facility]   [address] [place], [place] [address]     [phone]      Admissions: 8am-3pm M-F  [facility] ([facility]) [address] [place], [place] [phone]  [facility]: Residential Program [phone] Days)   [place], [place]      [phone] or [phone]     [facility]: [facility] [place], [place] (Walk in Hours over the weekend or by referral)  [facility] [address] [employer], [place], [place] [address] [phone]  Crisis Mobile: [facility]:  [phone] (for crisis response 24 hours a day) [facility] Hotline:      (917)679-2467 Outpatient Psychiatry and Counseling  [facility]: Mobile Crisis Management 24 hours:  [phone]  [facility] of the [employer] sliding scale fee and walk in schedule: M-F 8am-12pm/1pm-3pm [address]  [place], [place] [address] [phone]  [facility] [address] [place], [place] [address] [phone]  [facility] ([facility] [employer])- new patient walk-in appointments available Monday - Friday 8am -3pm.          [address] [place], [place] [address] [phone] or crisis line- [phone]  [facility] Outpatient Services/ Intensive Outpatient Therapy Program [address] [place], [place] [address] [phone]  [facility]                  Crisis Services      [phone] [address]     [place], [place] [address]                 [facility]   [facility] [phone]. [address] [place], [place] [address]   [facility]          [address]  [place], [place] [address]       [phone]  [facility] [address] [place], [place] [address] [phone]  [facility]    [address]    [place], [place] [address]     [phone]       Andee Poles, MD     [address]     [place] [address]     [phone]       [facility] [address]  [place] [address]  [facility]     [address] [place], [place]      [phone]       [facility] Eulogio Ditch, MD [address] [place], [place] [address] [phone]  [facility]     [address]     [place], [place] [address]     [phone]       [facility] [address] [place], [place] [address] [phone] Resources for Temporary Residential Assistance/Crisis Centers  [facility]) M-F 8am-3pm   [address] [place], [place] [address]   [phone] Services include: laundry, barbering, support groups, case management, phone  & computer access, showers, AA/NA mtgs, mental health/substance abuse nurse, job skills class, disability information, VA assistance, spiritual classes, etc.   HOMELESS SHELTERS  [facility]     [facility]   [address] [place]     [phone]              [employer] (women and children)       [address] [place], [place] [address] [phone] [email or web address] for application and process Application Required  [facility]   [address]    [place] [address]     [phone]                    [facility] [place] [address] [place], [place] [address] [phone] application appt.) Application Required  [facility] (women only)    [address]     [place], [place] [address]     [phone]      Intake starts 6pm daily Need valid ID, SSC, & Police report [employer] [address] [place], [place] [phone] Application Required  [employer] (men only)     [address]      [place], [place]     [phone]       [facility] (Pregnant women only) [address] [place],  [place] [phone]  [facility]      [address]      [place], [place] [address]     [phone]             [facility] [address] [place], [place] [phone] 90 day commitment/SA/Application process  [facility](men only)     [address]     [place], [place]     [phone]       Check-in at [facility] [address] [place], [place] [address] [phone] Men/Women/Women and Children must be there by 7 pm  [facility] [place], [place] [phone]

## 2024-01-22 NOTE — ED Notes (Signed)
 Patient A&Ox4. Has been calm, cooperative, and appropriate with peers. He denies intent to harm self/others. Denies A/VH. Patient endorses chronic pain in his left leg (See MAR). He denies withdrawal symptoms.. No acute distress observed. Routine safety checks conducted according to facility protocol. Patient agreed to notify staff should thoughts of harm toward self or others arise. We will continue to monitor for safety.

## 2024-01-22 NOTE — ED Provider Notes (Signed)
 FBC/OBS ASAP Discharge Summary  Date and Time: 01/22/2024 10:38 AM  Name: Mark Hammond  MRN:  [id number]   Discharge Diagnoses:  Final diagnoses:  Polysubstance abuse [facility])  HPI on admission 01/21/2024 Mark Core NP "The patient is a 33 year old single male who [facility]ac[facility] with reported concerns of suicidal ideation, homicidal ideation, substance use, and complaints of leg pain. He has a known psychiatric history that includes schizoaffective disorder, bipolar type, alcohol use disorder, and polysubstance use involving cocaine , methamphetamines, and cannabis."   [name], 33 y.o., male patient seen face to face by this provider, consulted with Dr. Docia Freeman; and chart reviewed on 01/22/24.    Subjective:   Patient seen face to face by this provider and medical student Brendolyn Callas.   Agree with assessment note, per Brendolyn Callas  "On assessment, patient presented much better compared to his state upon admission. He was alert and oriented to person, place, time, and situation and presented at baseline after having time to metabolize the substances he had used. He has been eating, hydrating, and sleeping well with improved energy levels. During our discussion, he explained that his current episode/need for care was stressor and substance-induced (methamphetamine, crack, and alcohol) after talking to his unsupportive uncle about his health and drug use. Over the last 24 hours, he has denied any SI, HI, or AVH. He was not very anxious, but showed signs of mild depression. Given his improvement, patient requested to be discharged today and was not interested in inpatient care. He looks forward to meeting with a different uncle to talk about his next steps to improve his personal situation outside of the hospital. He also looks forward to meeting his neurologist soon to address his left leg pain. In terms of next steps for the patient, we discussed the patient's desire to  slowly cut down his alcohol and drug use, but patient was not interested in inpatient services. Patient looked forward to continuing his plan from his most recent [facility] hospitalization. He also said that he was going to avoid family members who have been unsupportive of his situation, instead turning to family members who have been actively helping him out for emotional and social support. Patient has been compliant with his medications, stating they work great without side effects. He had no other concerns. Overall, patient presents much improved since the time of admission with goal-directed behavior upon discharge".   In addition to denying any suicidal or homicidal ideations.  Patient is able to verbally contract for safety.  He denies any intent, plan, or access to means to end his life or anyone else's.  He denies access to firearms/weapons.  He contributes his suicidal and homicidal statements upon admission due to his substance use.  He currently does not appear manic, psychotic, paranoid, or delusional.  At this time [name] is educated and verbalizes understanding of mental health resources and other crisis services in the community. He is instructed to call 911 and present to the nearest emergency room should he experience any suicidal/homicidal ideation, auditory/visual/hallucinations, or detrimental worsening of His mental health condition.    Stay Summary:   Upon completion of this admission the Mark Hammond was both mentally and medically stable for discharge denying suicidal/homicidal ideation, auditory/visual/tactile hallucinations, delusional thoughts and paranoia.   He agrees to follow-up with outpatient psychiatric resources and follow-up appointments.  He is looking forward to his neurology appointment on 01/25/2024 with [facility] related to his leg pain.  In addition he states he has a intake appointment with [facility] program.  Reached out to Samuel Crock to confirm.  Inpatient psychiatric admission and [facility] admission was offered to the patient and he declined.  Patient continues to request to be discharged and states his uncle will pick him up upon discharge.    Total Time spent with patient: 15 minutes  Past Psychiatric History: see H&P Past Medical History: See H&P Family History: see H&P Family Psychiatric History: see H&P Social History: see H&P  Tobacco Cessation:  N/A, patient does not currently use tobacco products  Current Medications:  Current Facility-Administered Medications  Medication Dose Route Frequency Provider Last Rate Last Admin   acetaminophen  (TYLENOL ) tablet 650 mg  650 mg Oral Q6H PRN Al Alias, NP   650 mg at 01/22/24 0528   alum & mag hydroxide-simeth (MAALOX/MYLANTA) 200-200-20 MG/5ML suspension 30 mL  30 mL Oral Q4H PRN Al Alias, NP       haloperidol  (HALDOL ) tablet 5 mg  5 mg Oral TID PRN Al Alias, NP       And   diphenhydrAMINE  (BENADRYL ) capsule 50 mg  50 mg Oral TID PRN Al Alias, NP       haloperidol  lactate (HALDOL ) injection 5 mg  5 mg Intramuscular TID PRN Al Alias, NP       And   diphenhydrAMINE  (BENADRYL ) injection 50 mg  50 mg Intramuscular TID PRN Al Alias, NP       And   LORazepam  (ATIVAN ) injection 2 mg  2 mg Intramuscular TID PRN Al Alias, NP       haloperidol  lactate (HALDOL ) injection 10 mg  10 mg Intramuscular TID PRN Al Alias, NP       And   diphenhydrAMINE  (BENADRYL ) injection 50 mg  50 mg Intramuscular TID PRN Al Alias, NP       And   LORazepam  (ATIVAN ) injection 2 mg  2 mg Intramuscular TID PRN Al Alias, NP       hydrOXYzine  (ATARAX ) tablet 25 mg  25 mg Oral Q6H PRN Al Alias, NP       hydrOXYzine  (ATARAX ) tablet 25 mg  25 mg Oral TID PRN Al Alias, NP       ibuprofen  (ADVIL ) tablet 600 mg  600 mg Oral Q6H PRN White, Patrice L, NP   600 mg at 01/22/24 1023   loperamide   (IMODIUM ) capsule 2-4 mg  2-4 mg Oral PRN Al Alias, NP       LORazepam  (ATIVAN ) tablet 1 mg  1 mg Oral Q6H PRN Al Alias, NP       LORazepam  (ATIVAN ) tablet 1 mg  1 mg Oral TID Al Alias, NP   1 mg at 01/22/24 1023   Followed by   Cecily Cohen ON 01/23/2024] LORazepam  (ATIVAN ) tablet 1 mg  1 mg Oral BID Al Alias, NP       Followed by   Cecily Cohen ON 01/24/2024] LORazepam  (ATIVAN ) tablet 1 mg  1 mg Oral Daily Dixon, [name] M, NP       magnesium  hydroxide (MILK OF MAGNESIA) suspension 30 mL  30 mL Oral Daily PRN Al Alias, NP       multivitamin with minerals tablet 1 tablet  1 tablet Oral Daily Dixon, [name] M, NP   1 tablet at 01/22/24 1024   ondansetron  (ZOFRAN -ODT) disintegrating tablet 4 mg  4 mg Oral  Q6H PRN Al Alias, NP       thiamine  (VITAMIN B1) tablet 100 mg  100 mg Oral Daily Kermit Ped, [name] M, NP   100 mg at 01/22/24 1024   traZODone  (DESYREL ) tablet 50 mg  50 mg Oral QHS PRN Al Alias, NP   50 mg at 01/21/24 2112   Current Outpatient Medications  Medication Sig Dispense Refill   ferrous sulfate  325 (65 FE) MG tablet Take 1 tablet (325 mg total) by mouth daily with breakfast. 30 tablet 0   gabapentin  (NEURONTIN ) 300 MG capsule Take 1 capsule (300 mg total) by mouth 2 (two) times daily. 60 capsule 0   nicotine  (NICODERM CQ ) 14 mg/24hr patch Place 1 patch (14 mg total) onto the skin daily. 28 patch 0   traZODone  (DESYREL ) 50 MG tablet Take 1 tablet (50 mg total) by mouth at bedtime as needed and may repeat dose one time if needed for sleep. 30 tablet 0    PTA Medications:  Facility Ordered Medications  Medication   [COMPLETED] lactated ringers  bolus 1,000 mL   [COMPLETED] lactated ringers  bolus 1,000 mL   [COMPLETED] thiamine  (VITAMIN B1) injection 100 mg   thiamine  (VITAMIN B1) tablet 100 mg   multivitamin with minerals tablet 1 tablet   LORazepam  (ATIVAN ) tablet 1 mg   hydrOXYzine  (ATARAX ) tablet 25 mg   loperamide  (IMODIUM ) capsule 2-4  mg   ondansetron  (ZOFRAN -ODT) disintegrating tablet 4 mg   [COMPLETED] LORazepam  (ATIVAN ) tablet 1 mg   Followed by   LORazepam  (ATIVAN ) tablet 1 mg   Followed by   Cecily Cohen ON 01/23/2024] LORazepam  (ATIVAN ) tablet 1 mg   Followed by   Cecily Cohen ON 01/24/2024] LORazepam  (ATIVAN ) tablet 1 mg   acetaminophen  (TYLENOL ) tablet 650 mg   alum & mag hydroxide-simeth (MAALOX/MYLANTA) 200-200-20 MG/5ML suspension 30 mL   magnesium  hydroxide (MILK OF MAGNESIA) suspension 30 mL   haloperidol  lactate (HALDOL ) injection 5 mg   And   diphenhydrAMINE  (BENADRYL ) injection 50 mg   And   LORazepam  (ATIVAN ) injection 2 mg   haloperidol  (HALDOL ) tablet 5 mg   And   diphenhydrAMINE  (BENADRYL ) capsule 50 mg   haloperidol  lactate (HALDOL ) injection 10 mg   And   diphenhydrAMINE  (BENADRYL ) injection 50 mg   And   LORazepam  (ATIVAN ) injection 2 mg   hydrOXYzine  (ATARAX ) tablet 25 mg   traZODone  (DESYREL ) tablet 50 mg   ibuprofen  (ADVIL ) tablet 600 mg   PTA Medications  Medication Sig   nicotine  (NICODERM CQ ) 14 mg/24hr patch Place 1 patch (14 mg total) onto the skin daily.   traZODone  (DESYREL ) 50 MG tablet Take 1 tablet (50 mg total) by mouth at bedtime as needed and may repeat dose one time if needed for sleep.   ferrous sulfate  325 (65 FE) MG tablet Take 1 tablet (325 mg total) by mouth daily with breakfast.   gabapentin  (NEURONTIN ) 300 MG capsule Take 1 capsule (300 mg total) by mouth 2 (two) times daily.       01/17/2024    9:24 AM 01/14/2024    3:38 AM 12/12/2023   10:39 AM  Depression screen PHQ 2/9  Decreased Interest 0 2 0  Down, Depressed, Hopeless 0 2 0  PHQ - 2 Score 0 4 0  Altered sleeping  1 0  Tired, decreased energy  1 0  Change in appetite  0 0  Feeling bad or failure about yourself   3 0  Trouble concentrating  2 0  Moving slowly  or fidgety/restless  2 0  Suicidal thoughts  1 0  PHQ-9 Score  14 0  Difficult doing work/chores  Very difficult Not difficult at all    Flowsheet  Row ED from 01/21/2024 in [facility] ED from [facility]/20/2024 in [facility] Department at [facility] ED from 01/19/2024 in [facility] Emergency Department at [facility]  C-SSRS RISK CATEGORY High Risk High Risk No Risk       Musculoskeletal  Strength & Muscle Tone: within normal limits Gait & Station: normal Patient leans: N/A  Psychiatric Specialty Exam  Presentation  General Appearance:  Appropriate for Environment; Disheveled  Eye Contact: Fair  Speech: Clear and Coherent; Normal Rate  Speech Volume: Normal  Handedness: Right   Mood and Affect  Mood: Euthymic  Affect: Congruent; Appropriate   Thought Process  Thought Processes: Coherent  Descriptions of Associations:Intact  Orientation:Full (Time, Place and Person)  Thought Content:Logical  Diagnosis of Schizophrenia or Schizoaffective disorder in past: Yes  Duration of Psychotic Symptoms: Greater than six months   Hallucinations:No data recorded Ideas of Reference:None  Suicidal Thoughts:No data recorded Homicidal Thoughts:No data recorded  Sensorium  Memory: Remote Good  Judgment: Fair  Insight: Fair   Art therapist  Concentration: Good  Attention Span: Good  Recall: Good  Fund of Knowledge: Good  Language: Good   Psychomotor Activity  Psychomotor Activity:No data recorded  Assets  Assets: Communication Skills; Resilience; Social Support   Sleep  Sleep:No data recorded  No data recorded  Physical Exam  Physical Exam Constitutional:      Appearance: Normal appearance.  Eyes:     General:        Right eye: No discharge.        Left eye: No discharge.  Cardiovascular:     Rate and Rhythm: Normal rate.  Pulmonary:     Effort: Pulmonary effort is normal. No respiratory distress.  Musculoskeletal:        General: Normal range of motion.     Cervical back: Normal range of motion.  Skin:    Coloration:  Skin is not jaundiced or pale.  Neurological:     Mental Status: He is alert and oriented to person, place, and time.  Psychiatric:        Attention and Perception: Attention and perception normal.        Mood and Affect: Mood is anxious.        Speech: Speech normal.        Behavior: Behavior normal. Behavior is cooperative.        Thought Content: Thought content normal.        Cognition and Memory: Cognition normal.        Judgment: Judgment is impulsive.    Review of Systems  Constitutional:  Negative for chills and fever.  HENT:  Negative for hearing loss.   Respiratory:  Negative for cough and shortness of breath.   Musculoskeletal:  Positive for joint pain (knee pain).  Neurological:  Negative for tremors.  Psychiatric/Behavioral:  Positive for substance abuse. The patient is nervous/anxious.    Blood pressure 114/68, pulse 94, temperature 98.3 F (36.8 C), temperature source Oral, resp. rate 18, SpO2 98%. There is no height or weight on file to calculate BMI.  Demographic Factors:  Male, Adolescent or young adult, Low socioeconomic status, and Unemployed  Loss Factors: Financial problems/change in socioeconomic status  Historical Factors: Impulsivity  Risk Reduction Factors:   Sense of responsibility  to family, Living with another person, especially a relative, Positive social support, Positive therapeutic relationship, and Positive coping skills or problem solving skills  Continued Clinical Symptoms:  Alcohol/Substance Abuse/Dependencies  Cognitive Features That Contribute To Risk:  None    Suicide Risk:  Minimal: No identifiable suicidal ideation.  Patients presenting with no risk factors but with morbid ruminations; may be classified as minimal risk based on the severity of the depressive symptoms  Plan Of Care/Follow-up recommendations:  Activity:  as tolerated  Diet:  regular   Disposition:   Discharge patient   Provided outpatient psychiatric  resources for medication management, therapy, and substance abuse treatment options.   Costella Dirks, NP 01/22/2024, 10:38 AM

## 2024-01-22 NOTE — ED Triage Notes (Signed)
 Pt states smoked crack 1 time today and had 3 16 oz beers and a carton of wine.

## 2024-01-22 NOTE — ED Notes (Signed)
 Pt sleeping@this  time breathing even and unlabored will continue to monitor for safety

## 2024-01-22 NOTE — ED Notes (Signed)
 Mark Hammond has been redirected to put his shirt on while on the unit with females on several occasions.  He has been non compliant and ignoring staff about this matter and laid down while putting the blanket over his head.

## 2024-01-22 NOTE — ED Notes (Addendum)
 Patient alert and oriented X 3. Discharged in no acute distress.  AVS given and pt verbalized understanding of  follow-up. Belongings returned from secured locker. They were escorted to lobby via staff for transport home by city bus as a bus pass was provided. Safety maintained

## 2024-01-23 ENCOUNTER — Emergency Department (HOSPITAL_COMMUNITY)
Admission: EM | Admit: 2024-01-23 | Discharge: 2024-01-24 | Disposition: A | Discharge status: Home / Self Care | Payer: MEDICAID | Source: Home / Self Care | Attending: Emergency Medicine | Admitting: Emergency Medicine

## 2024-01-23 ENCOUNTER — Encounter (HOSPITAL_COMMUNITY): Payer: Self-pay

## 2024-01-23 ENCOUNTER — Other Ambulatory Visit: Payer: Self-pay

## 2024-01-23 DIAGNOSIS — F1994 Other psychoactive substance use, unspecified with psychoactive substance-induced mood disorder: Secondary | ICD-10-CM

## 2024-01-23 DIAGNOSIS — M79605 Pain in left leg: Secondary | ICD-10-CM | POA: Insufficient documentation

## 2024-01-23 DIAGNOSIS — M79604 Pain in right leg: Secondary | ICD-10-CM | POA: Insufficient documentation

## 2024-01-23 LAB — HEPATIC FUNCTION PANEL
ALT: 70 U/L — ABNORMAL HIGH (ref 0–44)
AST: 77 U/L — ABNORMAL HIGH (ref 15–41)
Albumin: 2.9 g/dL — ABNORMAL LOW (ref 3.5–5.0)
Alkaline Phosphatase: 114 U/L (ref 38–126)
Bilirubin, Direct: 0.1 mg/dL (ref 0.0–0.2)
Indirect Bilirubin: 0.6 mg/dL (ref 0.3–0.9)
Total Bilirubin: 0.7 mg/dL (ref 0.0–1.2)
Total Protein: 7.7 g/dL (ref 6.5–8.1)

## 2024-01-23 LAB — ACETAMINOPHEN LEVEL: Acetaminophen (Tylenol), Serum: 17 ug/mL (ref 10–30)

## 2024-01-23 MED ORDER — GABAPENTIN 300 MG PO CAPS
300.0000 mg | ORAL_CAPSULE | Freq: Once | ORAL | Status: AC
  Administered 2024-01-23: 300 mg via ORAL
  Filled 2024-01-23: qty 1

## 2024-01-23 MED ORDER — CYCLOBENZAPRINE HCL 10 MG PO TABS
5.0000 mg | ORAL_TABLET | Freq: Once | ORAL | Status: AC
  Administered 2024-01-23: 5 mg via ORAL
  Filled 2024-01-23: qty 1

## 2024-01-23 MED ORDER — KETOROLAC TROMETHAMINE 15 MG/ML IJ SOLN
15.0000 mg | Freq: Once | INTRAMUSCULAR | Status: AC
  Administered 2024-01-23: 15 mg via INTRAMUSCULAR
  Filled 2024-01-23: qty 1

## 2024-01-23 NOTE — ED Triage Notes (Signed)
 Pt brought in by EMS for bilateral lower extremity pain for past 4 months. Appointment with neurologist on Thursday. Difficulty ambulating. A&Ox4.  Hx of alcohol/drug abuse. HR 114, Resp 20, 99% RA, Glucose - 150. Pt took 2 gapapentin 45 minutes ago. Pt states pain is the same just relentless.

## 2024-01-23 NOTE — ED Notes (Signed)
 Pt belong placed in locker 1 in ED purple zone

## 2024-01-23 NOTE — ED Notes (Signed)
Patient belongings returned to him.

## 2024-01-23 NOTE — ED Provider Notes (Signed)
 [facility] EMERGENCY DEPARTMENT AT [facility] Provider Note   CSN: [id number] Arrival date & time: 01/22/24  2205     History  Chief Complaint  Patient presents with   Suicidal    Mark Hammond is a 33 y.o. male history of bipolar, polysubstance and EtOH abuse, schizoaffective disorder presented reporting he wanted to IV see himself for SI as his medications were not working.  This was expressed in triage.  However during my examination, patient adamantly denies any SI or HI.  He states he just want to get away from his family.  During psych consult, patient additionally denied any SI/HI.  Patient does report that he has been taking quite a bit of Tylenol  recently for pain but not intentionally to hurt himself.  Levels were noted to be elevated here.  He denies any abdominal pain or vomiting.  HPI    Past Medical History:  Diagnosis Date   Alcoholic gastritis    Bipolar 1 disorder (HCC)    Dental abscess    Depression    Drug abuse (HCC)    ETOH abuse    Hallucination    Schizo affective schizophrenia (HCC)      Home Medications Prior to Admission medications   Medication Sig Start Date End Date Taking? Authorizing Provider  ferrous sulfate  325 (65 FE) MG tablet Take 1 tablet (325 mg total) by mouth daily with breakfast. 01/17/24   Joice Nares, MD  gabapentin  (NEURONTIN ) 300 MG capsule Take 1 capsule (300 mg total) by mouth 2 (two) times daily. 01/17/24   Joice Nares, MD  nicotine  (NICODERM CQ ) 14 mg/24hr patch Place 1 patch (14 mg total) onto the skin daily. 01/17/24   Joice Nares, MD  traZODone  (DESYREL ) 50 MG tablet Take 1 tablet (50 mg total) by mouth at bedtime as needed and may repeat dose one time if needed for sleep. 01/17/24   Joice Nares, MD      Allergies    Codeine    Review of Systems   Review of Systems  Psychiatric/Behavioral:  Positive for agitation.     Physical Exam Updated Vital Signs BP 114/77 (BP Location: Left Arm)    Pulse 73   Temp 98 F (36.7 C) (Oral)   Resp 14   Ht 5\' 4"  (1.626 m)   Wt 63.5 kg   SpO2 100%   BMI 24.03 kg/m  Physical Exam Vitals and nursing note reviewed.  Constitutional:      General: He is not in acute distress.    Appearance: He is well-developed.  HENT:     Head: Normocephalic and atraumatic.  Eyes:     Conjunctiva/sclera: Conjunctivae normal.  Cardiovascular:     Rate and Rhythm: Normal rate and regular rhythm.     Heart sounds: No murmur heard. Pulmonary:     Effort: Pulmonary effort is normal. No respiratory distress.     Breath sounds: Normal breath sounds.  Abdominal:     Palpations: Abdomen is soft.     Tenderness: There is no abdominal tenderness.  Musculoskeletal:        General: No swelling.     Cervical back: Neck supple.  Skin:    General: Skin is warm and dry.     Capillary Refill: Capillary refill takes less than 2 seconds.  Neurological:     Mental Status: He is alert.  Psychiatric:        Mood and Affect: Mood normal.  Thought Content: Thought content normal.        Judgment: Judgment normal.     ED Results / Procedures / Treatments   Labs (all labs ordered are listed, but only abnormal results are displayed) Labs Reviewed  COMPREHENSIVE METABOLIC PANEL WITH GFR - Abnormal; Notable for the following components:      Result Value   Potassium 3.2 (*)    Glucose, Bld 147 (*)    Calcium 8.7 (*)    Albumin 3.0 (*)    AST 83 (*)    ALT 75 (*)    All other components within normal limits  ETHANOL - Abnormal; Notable for the following components:   Alcohol, Ethyl (B) 107 (*)    All other components within normal limits  SALICYLATE LEVEL - Abnormal; Notable for the following components:   Salicylate Lvl <7.0 (*)    All other components within normal limits  ACETAMINOPHEN  LEVEL - Abnormal; Notable for the following components:   Acetaminophen  (Tylenol ), Serum 71 (*)    All other components within normal limits  CBC - Abnormal;  Notable for the following components:   RBC 4.07 (*)    Hemoglobin 11.9 (*)    HCT 36.3 (*)    All other components within normal limits  RAPID URINE DRUG SCREEN, HOSP PERFORMED - Abnormal; Notable for the following components:   Cocaine  POSITIVE (*)    Benzodiazepines POSITIVE (*)    Tetrahydrocannabinol POSITIVE (*)    All other components within normal limits  HEPATIC FUNCTION PANEL - Abnormal; Notable for the following components:   Albumin 2.9 (*)    AST 77 (*)    ALT 70 (*)    All other components within normal limits  ACETAMINOPHEN  LEVEL    EKG None  Radiology No results found.  Procedures Procedures    Medications Ordered in ED Medications  ketorolac  (TORADOL ) 15 MG/ML injection 15 mg (has no administration in time range)  gabapentin  (NEURONTIN ) capsule 300 mg (300 mg Oral Given 01/23/24 1024)  cyclobenzaprine  (FLEXERIL ) tablet 5 mg (5 mg Oral Given 01/23/24 1024)    ED Course/ Medical Decision Making/ A&P                                 Medical Decision Making Amount and/or Complexity of Data Reviewed Labs: ordered.   This patient presents to the ED with chief complaint(s) of agitation with family.  The complaint involves an extensive differential diagnosis and also carries with it a high risk of complications and morbidity.   pertinent past medical history as listed in HPI  The differential diagnosis includes  SI/HI, psych, intoxication  Additional history obtained:  Records reviewed Care Everywhere/External Records  Initial Assessment:   Hemodynamically stable, nontoxic-appearing patient presenting reporting SI in triage but adamantly denying any during my examination.  States she was just agitated at his family wanted to get away from them.  Patient is cleared from a psych standpoint.  Noted to have elevated acetaminophen  levels to 71.  States he has been taking a lot of Tylenol  for leg pain that was approximately 13 hours ago.  By the time patient was  back to her room and levels rechecked.  Similar levels were back to 17 and transaminitis improved.  Noted to be positive for cocaine , benzos and cannabis.  Patient cleared from medical standpoint.  Independent ECG interpretation:  none  Independent labs interpretation:  The following labs were  independently interpreted:  CBC without significant abnormality, CMP with mild hypokalemia, transaminitis 83 and 75 AST/ALT, ethanol to 107, salicylate within normal limits, acetaminophen  level initially to 71, recheck down to 70  Independent visualization and interpretation of imaging: none  Treatment and Reassessment: Patient had received Flexeril  and gabapentin  earlier for leg pain additionally given Toradol   Consultations obtained:   Psych Roise Cleaver NP cleared from a psychiatric standpoint  Disposition:   Patient be discharged home.  Provided follow-up with PCP.  Provided PCP and psychiatric resources.  Encouraged to use Tylenol  appropriately as instructed on bottle. The patient has been appropriately medically screened and/or stabilized in the ED. I have low suspicion for any other emergent medical condition which would require further screening, evaluation or treatment in the ED or require inpatient management. At time of discharge the patient is hemodynamically stable and in no acute distress. I have discussed work-up results and diagnosis with patient and answered all questions. Patient is agreeable with discharge plan. We discussed strict return precautions for returning to the emergency department and they verbalized understanding.     Social Determinants of Health:   Patient's impaired access to primary care  increases the complexity of managing their presentation  This note was dictated with voice recognition software.  Despite best efforts at proofreading, errors may have occurred which can change the documentation meaning.          Final Clinical Impression(s) / ED  Diagnoses Final diagnoses:  Psychiatric complaint    Rx / DC Orders ED Discharge Orders     None         Stanton Earthly 01/23/24 1151    Burnette Carte, MD 01/23/24 1555

## 2024-01-23 NOTE — ED Notes (Signed)
 Patient resting in bed, no s/s of distress, called kitchen for breakfast tray.

## 2024-01-23 NOTE — ED Notes (Signed)
 Extra SST & Lav drawn

## 2024-01-23 NOTE — Discharge Instructions (Signed)
 Outpatient psychiatric Services  Walk in hours for medication management Monday, Wednesday, Thursday, and Friday from 8:00 AM to 11:00 AM Recommend arriving by by 7:30 AM.  It is first come first serve.    Walk in hours for therapy intake Monday and Wednesday only 8:00 AM to 11:00 AM Encouraged to arrive by 7:30 AM.  It is first come first serve   Inpatient patient psychiatric services The [facility] offers comprehensive behavioral heath care services for mental health and substance abuse treatment.  Social work can also assist with referral to or getting you into a rehabilitation program short or long term  [facility]) Monday - Friday 8am - 3pm          Sat & [place] - 2pm [address] [place]  [place], [place] [address]   [phone]     www.interactiveresourcecenter.org IRC offers among other critical resources: showers, laundry, barbershop, phone bank, mailroom, computer lab, medical clinic, gardens and a bike maintenance area.   AREA SHELTERS  [facility] [employer]  (Men & women) [address] [place] [phone]  [facility] (Men/women/families) [address] [place] [phone]   [facility] (Families with children) [phone] [address]  [place] [phone]   [facility] (Domestic Violence Shelter) [address] [place]  [address]  [place] [phone]   [facility] (Children ages 66-17) [address] [place]  [address]  [place] [phone]   [facility]   (Women & children) [address] [place] [phone]   [facility] (Women/substance abuse) [address] [place] [address]  [place] [phone]   [facility] (Men) [address]   [phone]   [facility] (Men) [address]  [place] [phone]  [employer] (Women) [phone]  [place] [phone]   [facility] (Single women & women with  children) [address]  [place] [phone]  [facility] (Men/women/families) [address] [phone]    [facility]   (Domestic Violence shelter) [address]  [place] [phone]   [facility] (Men & women) [address]  [place] [phone]  [facility] (Men) [address]  [employer] [phone]   [facility] (Men) [address]  [place] [phone]   Holiday representative (Single women & families) [address]  [place] [phone]  [facility] (Men/women & families) [address]  [place] [phone]    If you are at risk of losing your housing (throughout [facility]) call the Housing Hotline at 6017390202. You may also contact 2-1-1, a FREE service of the [address] that provides information about many resources including housing. Dial 211, or visit online at PooledIncome.pl. [facility]:  [facility] of [place], contact Tasheem Elms [phone] (men only)                          [place] in [place]:  90 day homeless program for women and men;                             contact Rev. Chambers [phone]  [facility]:  [facility]:  men/women/children [phone]  [facility]:  Shelter in [place], Pastor Kivett [phone]                       [employer] in [place], Braden Caddy [phone]   [facility]:  [facility]risis Council for Abused Women, [phone]; (women and children)  [facility]:  [employer], men/women/children; [phone]                           [facility] in [place], [phone]; substance abuse halfway house for men              [facility]; 4 bedroom house in [facility] for homeless women, contact Aniceto Barley [phone]               [facility] in [place], [phone] (women and children)               [facility], for abused women and  children, 24 hour crisis line, [phone], Donnamae Gaba  [facility], halfway house for women, [place], [phone]  [facility]:  [facility], [phone]; open Mon-Thurs from HBZ16 - March 15 when temp is below 32 degrees                              [facility]; [place], [phone]; cell [phone]; open 24/7  [facility]:  Outreach for [place] - Thomasina Fletcher - [phone]  [place]:  transitional housing for women and children; Stanton Earthly [phone] Shelter List:  [facility] [place]) [address] [place], [place]  Phone: [phone]  [facility] [address], [place], [place] [address] Phone: [phone]  [facility] (Women only) [address]Margarita Shear [place], [place] [address] Phone: [phone]  [facility] [address], [place] [address] Phone: [phone]  [facility]: [phone]. [address] [place], [place] [address] Phone: [phone]  [facility]  [address] [place], [place] [address] (Check in at 6:00PM for placement at a local shelter) Phone: [phone] Interactive Resource Center  Hours Monday - Friday: Services: 8:00AM - 3:00PM Offices: 8:00AM - 5:00PM  Physical Address [address] [place], [place] [address]   Please use this address for [facility] Mailing Address [address] [address] [place], [place] [address]  [facility] helps people reconnect This is a safe place to rest, take care of basic needs and access the services and community that make all the difference. Our guests come to the [facility] to take a class, do laundry, meet with a case manager or to get their mail. Sometimes they just need to sit in our dayroom and enjoy a conversation.  Here you will find everything from shower facilities to a computer lab, a mail  room, classrooms and meeting spaces.  The IRC helps people reconnect with their own lives and with the community at large.  A caring community setting One of the most exciting aspects of the IRC is that so many individuals and organizations in the community are a part of the everyday experience. Whether it's a hair stylist or law firm offering services right in-house, our partners make the [facility] a truly interactive resource center where services are brought to our guests. The IRC brings together a comprehensive community of talented people who not only want to help solve problems, but also to be a part of our guests' lives.  Integrated Care We take a person-centered approach to assistance that includes: Case management [address] [employer]  clinic Mental health nurse Referrals  Fundamental Services We start with necessities: Showers and hygiene supplies [employer] access Mailing addresses and mailboxes Replacement IDs Onsite barbershop Storage lockers [place] winter warming center  Self-Sufficiency We connect our guests with: Skilled trade classes Job skills classes Resume and jobs application assistance Interview training GED Academic librarian

## 2024-01-23 NOTE — Consult Note (Signed)
 [facility] Psychiatric Consult Initial  Patient Name: .Mark Hammond  MRN: [id number]  DOB: [birth year]  Consult Order details:  Orders (From admission, onward)     Start     Ordered   01/22/24 2254  CONSULT TO CALL ACT TEAM       Ordering Provider: Nelly Banco, PA-C  Provider:  (Not yet assigned)  Question:  Reason for Consult?  Answer:  Psych consult   01/22/24 2253             Mode of Visit: In person    Psychiatry Consult Evaluation  Service Date: January 23, 2024 LOS:  LOS: 0 days  Chief Complaint "I'm good to go now"  Primary Psychiatric Diagnoses  Substance induced mood disorder 2.   3.    Assessment  Mark Hammond is a 33 y.o. male admitted: Presented to the EDfor 01/22/2024 10:14 PM for passive suicidal ideations following crack and aclohol use. He carries the psychiatric diagnoses of MDD and has a past medical history of  leg pain and neuropathy.    On initial examination, patient is pleasant, denies SI/HI/AVH, and is now requesting discharge. Please see plan below for detailed recommendations.   Diagnoses:  Active Hospital problems: Principal Problem:   Substance induced mood disorder (HCC)    Plan   ## Psychiatric Medication Recommendations:  Continue current medications, no changes made  ## Medical Decision Making Capacity: Not specifically addressed in this encounter    ## Disposition:-- There are no psychiatric contraindications to discharge at this time  ## Behavioral / Environmental: - No specific recommendations at this time.     ## Safety and Observation Level:  - Based on my clinical evaluation, I estimate the patient to be at low risk of self harm in the current setting. - At this time, we recommend  routine. This decision is based on my review of the chart including patient's history and current presentation, interview of the patient, mental status examination, and consideration of suicide risk including evaluating suicidal ideation,  plan, intent, suicidal or self-harm behaviors, risk factors, and protective factors. This judgment is based on our ability to directly address suicide risk, implement suicide prevention strategies, and develop a safety plan while the patient is in the clinical setting. Please contact our team if there is a concern that risk level has changed.  CSSR Risk Category:C-SSRS RISK CATEGORY: High Risk  Suicide Risk Assessment: Patient has following modifiable risk factors for suicide: social isolation, which we are addressing by resources provided in AVS. Patient has following non-modifiable or demographic risk factors for suicide: male gender Patient has the following protective factors against suicide: Access to outpatient mental health care, Supportive family, Supportive friends, and Cultural, spiritual, or religious beliefs that discourage suicide  Thank you for this consult request. Recommendations have been communicated to the primary team.  We will psych clear for discharge at this time.   Mark Cleaver, NP       History of Present Illness  Relevant Aspects of Hospital ED Course:  Pt was recently at [facility] from 4/20-4/21 for passive suicidal ideations and substance use. He was offered inpatient treatment, but after one night decided he felt better, denied SI/HI/AVH and requested to discharge instead. He was discharged yesterday around noon, and then presented to [facility] around 2000 reporting crack and alcohol use, as well as passive SI. TTS consult placed. Pt has had over 10 ED/UC visits in the month of April.   Patient Report:  Patient  seen this morning at [facility] ED for face-to-face psychiatric evaluation.  Patient stated upon discharge from [facility] yesterday he did not go to his uncles house like he should and instead relapsed on alcohol and crack.  After he relapsed he realized he did the wrong thing became very depressed had passive suicidal ideations and presented to [facility] ED.  Patient  stated he had nowhere to go and his leg was hurting as well so that is why he came to the ED.  Today he is feeling much better and feels like he can stick to his plan.  His plan is to go to his uncles house here in [place], he already spoke to him on the phone and his uncle is aware he is coming to stay with him for a while.  Patient denies any current suicidal ideations.  Denies homicidal ideations.  Denies any access to weapons or firearms.  Denies AVH.   In addition to denying any suicidal or homicidal ideations.  Patient is able to verbally contract for safety.  He denies any intent, plan, or access to means to end his life or anyone else's.  He denies access to firearms/weapons.  He contributes his suicidal and homicidal statements upon admission due to his substance use.  He currently does not appear manic, psychotic, paranoid, or delusional.   I offered patient inpatient psychiatric treatment or substance abuse treatment, however he declined. While future psychiatric events cannot be accurately predicted, the patient does not currently require further acute psychiatric care and does not currently meet [place]  involuntary commitment criteria. It is recommended that the patient continue treatment in outpatient care. A follow up plan and crisis plan are in place, have been discussed with the patient, and the patient agrees to the plan at time of discharge.    At this time Mark Hammond is educated and verbalizes understanding of mental health resources and other crisis services in the community. He is instructed to call 911 and present to the nearest emergency room should he experience any suicidal/homicidal ideation, auditory/visual/hallucinations, or detrimental worsening of His mental health condition.    Review of Systems  Psychiatric/Behavioral:  Positive for substance abuse.   All other systems reviewed and are negative.    Psychiatric and Social History  Psychiatric History:   Information collected from patient and chart review   Prev Dx/Sx: Schizoaffective disorder, polysubstance abuse, anxiety Current Psych Provider: None Home Meds (current): Yes Previous Med Trials: Yes Therapy: None   Prior Psych Hospitalization: Yes Prior Self Harm: Yes Prior Violence: Denies   Family Psych History: Denies Family Hx suicide: Denies   Social History:  Developmental Hx: Deferred Educational Hx: Patient did not graduate high school Occupational Hx: Unemployed Legal Hx: Yes Living Situation: Homeless Spiritual Hx: Yes Access to weapons/lethal means: Denies   Substance History Alcohol: Yes Type of alcohol any Last Drink unknown Number of drinks per day unknown History of alcohol withdrawal seizures denies History of DT's denies Tobacco: Yes Illicit drugs: Yes Prescription drug abuse: Past history Rehab hx: Yes  Exam Findings  Physical Exam:  Vital Signs:  Temp:  [98 F (36.7 C)-98.3 F (36.8 C)] 98 F (36.7 C) (04/22 1109) Pulse Rate:  [73-110] 73 (04/22 1109) Resp:  [14-16] 14 (04/22 1109) BP: (104-114)/(72-77) 114/77 (04/22 1109) SpO2:  [96 %-100 %] 100 % (04/22 1109) Weight:  [63.5 kg] 63.5 kg (04/22 1106) Blood pressure 114/77, pulse 73, temperature 98 F (36.7 C), temperature source Oral, resp. rate 14,  height 5\' 4"  (1.626 m), weight 63.5 kg, SpO2 100%. Body mass index is 24.03 kg/m.  Physical Exam Vitals and nursing note reviewed.  Neurological:     Mental Status: He is alert and oriented to person, place, and time.     Mental Status Exam: General Appearance: Well Groomed  Orientation:  Full (Time, Place, and Person)  Memory:  Immediate;   Good Recent;   Good  Concentration:  Concentration: Good  Recall:  Good  Attention  Good  Eye Contact:  Good  Speech:  Clear and Coherent  Language:  Good  Volume:  Normal  Mood: "much better today"  Affect:  Appropriate and Congruent  Thought Process:  Coherent  Thought Content:  WDL   Suicidal Thoughts:  No  Homicidal Thoughts:  No  Judgement:  Fair  Insight:  Fair  Psychomotor Activity:  Normal  Akathisia:  No  Fund of Knowledge:  Fair      Assets:  Communication Skills Desire for Improvement Leisure Time Physical Health Resilience  Cognition:  WNL  ADL's:  Intact  AIMS (if indicated):        Other History   These have been pulled in through the EMR, reviewed, and updated if appropriate.  Family History:  The patient's family history includes Stroke in an other family member.  Medical History: Past Medical History:  Diagnosis Date   Alcoholic gastritis    Bipolar 1 disorder (HCC)    Dental abscess    Depression    Drug abuse (HCC)    ETOH abuse    Hallucination    Schizo affective schizophrenia [facility])     Surgical History: History reviewed. No pertinent surgical history.   Medications:  No current facility-administered medications for this encounter.  Current Outpatient Medications:    ferrous sulfate  325 (65 FE) MG tablet, Take 1 tablet (325 mg total) by mouth daily with breakfast., Disp: 30 tablet, Rfl: 0   gabapentin  (NEURONTIN ) 300 MG capsule, Take 1 capsule (300 mg total) by mouth 2 (two) times daily., Disp: 60 capsule, Rfl: 0   nicotine  (NICODERM CQ ) 14 mg/24hr patch, Place 1 patch (14 mg total) onto the skin daily., Disp: 28 patch, Rfl: 0   traZODone  (DESYREL ) 50 MG tablet, Take 1 tablet (50 mg total) by mouth at bedtime as needed and may repeat dose one time if needed for sleep., Disp: 30 tablet, Rfl: 0  Allergies: Allergies  Allergen Reactions   Codeine Other (See Comments)    Patient states parents told him he was allergic at a young age.    Mark Cleaver, NP

## 2024-01-23 NOTE — ED Notes (Signed)
 Called house coverage for sitter and notified charge nurse for a sitter.

## 2024-01-24 MED ORDER — ACETAMINOPHEN 325 MG PO TABS
650.0000 mg | ORAL_TABLET | Freq: Once | ORAL | Status: AC
Start: 2024-01-24 — End: 2024-01-24
  Administered 2024-01-24: 650 mg via ORAL
  Filled 2024-01-24: qty 2

## 2024-01-24 NOTE — ED Provider Notes (Signed)
 [place] EMERGENCY DEPARTMENT AT [facility] Provider Note   CSN: [id number] Arrival date & time: 01/23/24  2021     History  Chief Complaint  Patient presents with   Leg Pain    Mark Hammond is a 33 y.o. male with past medical history significant for depression, methamphetamine use, alcohol disorder, schizophrenia, frequent emergency department visits who presents with concern for bilateral lower extremity pain for the last 4 months.  He was seen at [facility] just last night.  Patient reports the pain is the same as it has been but is just relentless.  He denies any suicidal or homicidal ideation at this time.   Leg Pain      Home Medications Prior to Admission medications   Medication Sig Start Date End Date Taking? Authorizing Provider  ferrous sulfate  325 (65 FE) MG tablet Take 1 tablet (325 mg total) by mouth daily with breakfast. 01/17/24   Joice Nares, MD  gabapentin  (NEURONTIN ) 300 MG capsule Take 1 capsule (300 mg total) by mouth 2 (two) times daily. 01/17/24   Joice Nares, MD  nicotine  (NICODERM CQ ) 14 mg/24hr patch Place 1 patch (14 mg total) onto the skin daily. 01/17/24   Joice Nares, MD  traZODone  (DESYREL ) 50 MG tablet Take 1 tablet (50 mg total) by mouth at bedtime as needed and may repeat dose one time if needed for sleep. 01/17/24   Joice Nares, MD      Allergies    Codeine    Review of Systems   Review of Systems  All other systems reviewed and are negative.   Physical Exam Updated Vital Signs BP (!) 142/75 (BP Location: Left Arm)   Pulse (!) 111   Temp 98.7 F (37.1 C) (Oral)   Resp 18   SpO2 94%  Physical Exam Vitals and nursing note reviewed.  Constitutional:      General: He is not in acute distress.    Appearance: Normal appearance.  HENT:     Head: Normocephalic and atraumatic.  Eyes:     General:        Right eye: No discharge.        Left eye: No discharge.  Cardiovascular:     Rate and Rhythm: Normal  rate and regular rhythm.  Pulmonary:     Effort: Pulmonary effort is normal. No respiratory distress.  Musculoskeletal:        General: No deformity.     Comments: Patient with intact strength of bilateral lower extremities, when attempting to ambulate he does have a limp, but he has no step-off, deformities throughout.  Suspect likely an exacerbation of his known and chronic pain.  Skin:    General: Skin is warm and dry.  Neurological:     Mental Status: He is alert and oriented to person, place, and time.  Psychiatric:        Mood and Affect: Mood normal.        Behavior: Behavior normal.     ED Results / Procedures / Treatments   Labs (all labs ordered are listed, but only abnormal results are displayed) Labs Reviewed - No data to display  EKG None  Radiology No results found.  Procedures Procedures    Medications Ordered in ED Medications  acetaminophen  (TYLENOL ) tablet 650 mg (has no administration in time range)    ED Course/ Medical Decision Making/ A&P  Medical Decision Making  This is an overall well-appearing 33 year old male with past medical history significant for schizophrenia, alcohol abuse, malingering who presents with concern for ongoing leg pain.  He was seen and evaluated just yesterday for the same symptoms.  Discussed with patient that given he is neurovascularly intact throughout on exam I do not think that his presentation tonight warrants additional imaging workup or evaluation, discussed we can give him some medicine for pain, otherwise plan to discharge with outpatient follow-up as already scheduled. Patient understands and agrees to plan.  He is mildly tachycardic on arrival, pulse of 111, improved when I assessed the patient, pulse 92.  He denies any suicidal or homicidal ideations.  Not tremulous on my exam, low clinical suspicion for acute alcohol withdrawal at this time. Final Clinical Impression(s) / ED  Diagnoses Final diagnoses:  None    Rx / DC Orders ED Discharge Orders     None         Stefan Edge 01/24/24 0217    Lindle Rhea, MD 01/24/24 [phone]

## 2024-01-24 NOTE — Discharge Instructions (Signed)
 Please use Tylenol  or ibuprofen  for pain.  You may use 600 mg ibuprofen  every 6 hours or 1000 mg of Tylenol  every 6 hours.  You may choose to alternate between the 2.  This would be most effective.  Not to exceed 4 g of Tylenol  within 24 hours.  Not to exceed 3200 mg ibuprofen  24 hours.  Please follow-up with an outpatient provider regarding your ongoing leg pain.

## 2024-01-25 ENCOUNTER — Encounter: Payer: Self-pay | Admitting: Diagnostic Neuroimaging

## 2024-01-25 ENCOUNTER — Ambulatory Visit: Payer: MEDICAID | Admitting: Diagnostic Neuroimaging

## 2024-01-25 VITALS — BP 136/86 | HR 107 | Ht 63.0 in | Wt 128.0 lb

## 2024-01-25 DIAGNOSIS — M79605 Pain in left leg: Secondary | ICD-10-CM | POA: Diagnosis not present

## 2024-01-25 DIAGNOSIS — L03116 Cellulitis of left lower limb: Secondary | ICD-10-CM | POA: Diagnosis not present

## 2024-01-25 NOTE — Patient Instructions (Signed)
  LEFT THIGH PAIN / NUMBNESS (since 10/19/23) - also left foot swelling, redness, calor (now concern for cellulitis of left foot; also abnormal STIR signal in the lumbar paraspinal muscles) - check MRI left hip / left femur - check BLE u/s (rule out DVT) - consider ER evaluation to expedite workup, especially since he does not have PCP

## 2024-01-25 NOTE — Progress Notes (Unsigned)
 [facility]  PATIENT: Mark Hammond DOB: 02-09-1991  REFERRING CLINICIAN: Zouev, Dmitri, MD HISTORY FROM: patient  REASON FOR VISIT: new consult   HISTORICAL  CHIEF COMPLAINT:  Chief Complaint  Patient presents with   Numbness    Rm 7 with uncle Mark Hammond Pt is well, reports he has been having tingling and shocking pain on L leg since Jan 16th. He is unable to bare weight on that leg.   He is taking more Gabapentin  than prescribed, he takes 2 AM, NOON and PM as well as Tylenol .     HISTORY OF PRESENT ILLNESS:   33 year old male here for evaluation of left leg pain.  Symptoms started on October 19, 2023 when he was incarcerated.  He woke up 1 day and felt pain in his left thigh.  This is progressively worsened over time.  Eventually he was released from jail.  He started to have ongoing pain mainly from his left hip down to his left knee.  Went to the emergency room several times had different imaging studies but no specific cause was found.  He was recommended to follow-up outpatient.  Which he does not have a primary care physician.  Also has history of polysubstance abuse including alcohol, crack cocaine , methamphetamine.    REVIEW OF SYSTEMS: Full 14 system review of systems performed and negative with exception of: as per HPI.  ALLERGIES: Allergies  Allergen Reactions   Codeine Other (See Comments)    Patient states parents told him he was allergic at a young age.    HOME MEDICATIONS: Outpatient Medications Prior to Visit  Medication Sig Dispense Refill   acetaminophen  (TYLENOL ) 500 MG tablet Take 500 mg by mouth every 6 (six) hours as needed.     ferrous sulfate  325 (65 FE) MG tablet Take 1 tablet (325 mg total) by mouth daily with breakfast. 30 tablet 0   gabapentin  (NEURONTIN ) 300 MG capsule Take 1 capsule (300 mg total) by mouth 2 (two) times daily. 60 capsule 0   traZODone  (DESYREL ) 50 MG tablet Take 1 tablet (50 mg total) by mouth at bedtime as needed  and may repeat dose one time if needed for sleep. 30 tablet 0   nicotine  (NICODERM CQ ) 14 mg/24hr patch Place 1 patch (14 mg total) onto the skin daily. (Patient not taking: Reported on 01/25/2024) 28 patch 0   No facility-administered medications prior to visit.    PAST MEDICAL HISTORY: Past Medical History:  Diagnosis Date   Alcoholic gastritis    Bipolar 1 disorder (HCC)    Dental abscess    Depression    Drug abuse (HCC)    ETOH abuse    Hallucination    Schizo affective schizophrenia (HCC)     PAST SURGICAL HISTORY: History reviewed. No pertinent surgical history.  FAMILY HISTORY: Family History  Problem Relation Age of Onset   Stroke Other     SOCIAL HISTORY: Social History   Socioeconomic History   Marital status: Single    Spouse name: Not on file   Number of children: Not on file   Years of education: Not on file   Highest education level: Not on file  Occupational History   Not on file  Tobacco Use   Smoking status: Every Day    Current packs/day: 1.00    Types: Cigarettes    Passive exposure: Current   Smokeless tobacco: Never  Vaping Use   Vaping status: Every Day   Substances: THC  Substance and Sexual  Activity   Alcohol use: Yes    Comment: 4-5x 40oz beers daily   Drug use: Yes    Frequency: 7.0 times per week    Types: "Crack" cocaine , Heroin, MDMA (Ecstacy), Marijuana, Cocaine     Comment: 01/22/24   Sexual activity: Yes  Other Topics Concern   Not on file  Social History Narrative   Pt lives in [place] with ex-girlfriend.  Pt is not followed by an outpatient psychiatric provider.   Social Drivers of Corporate investment banker Strain: Not on file  Food Insecurity: No Food Insecurity (01/21/2024)   Hunger Vital Sign    Worried About Running Out of Food in the Last Year: Never true    Ran Out of Food in the Last Year: Never true  Recent Concern: Food Insecurity - Food Insecurity Present (12/06/2023)   Hunger Vital Sign    Worried About  Programme researcher, broadcasting/film/video in the Last Year: Often true    Ran Out of Food in the Last Year: Often true  Transportation Needs: Unmet Transportation Needs (01/21/2024)   PRAPARE - Administrator, Civil Service (Medical): No    Lack of Transportation (Non-Medical): Yes  Physical Activity: Not on file  Stress: Not on file  Social Connections: Not on file  Intimate Partner Violence: Not At Risk (01/21/2024)   Humiliation, Afraid, Rape, and Kick questionnaire    Fear of Current or Ex-Partner: No    Emotionally Abused: No    Physically Abused: No    Sexually Abused: No     PHYSICAL EXAM  GENERAL EXAM/CONSTITUTIONAL: Vitals:  Vitals:   01/25/24 1549  BP: 136/86  Pulse: (!) 107  Weight: 128 lb (58.1 kg)  Height: 5\' 3"  (1.6 m)   Body mass index is 22.67 kg/m. Wt Readings from Last 3 Encounters:  01/25/24 128 lb (58.1 kg)  01/23/24 139 lb 15.9 oz (63.5 kg)  01/19/24 140 lb (63.5 kg)   Patient is in no distress; FRAIL APPEARING; DISHEVELED; neck is supple  CARDIOVASCULAR: Examination of carotid arteries is normal; no carotid bruits Regular rate and rhythm, no murmurs Examination of peripheral vascular system by observation and palpation is normal  EYES: Ophthalmoscopic exam of optic discs and posterior segments is normal; no papilledema or hemorrhages No results found.  MUSCULOSKELETAL: Gait, strength, tone, movements noted in Neurologic exam below  NEUROLOGIC: MENTAL STATUS:      No data to display         awake, alert, oriented to person, place and time recent and remote memory intact normal attention and concentration language fluent, comprehension intact, naming intact fund of knowledge appropriate  CRANIAL NERVE:  2nd - no papilledema on fundoscopic exam 2nd, 3rd, 4th, 6th - pupils equal and reactive to light, visual fields full to confrontation, extraocular muscles intact, no nystagmus 5th - facial sensation symmetric 7th - facial strength  symmetric 8th - hearing intact 9th - palate elevates symmetrically, uvula midline 11th - shoulder shrug symmetric 12th - tongue protrusion midline  MOTOR:  normal bulk and tone, full strength in the BUE, BLE LEFT FOOT SWOLLEN, RED, WARM LEFT LEG HIP FLEXION 1-2/5; KNEE EXT 3, KNEE FLEX 4, DF 4+ MULTIPLE CUTS, SCRAPS ON FEET AND LEGS  SENSORY:  normal and symmetric to light touch, temperature, vibration  COORDINATION:  finger-nose-finger, fine finger movements normal  REFLEXES:  deep tendon reflexes present and symmetric; DECR IN LEFT KNEE  GAIT/STATION:  ANTALGIC GAIT    DIAGNOSTIC DATA (LABS, IMAGING, TESTING) -  I reviewed patient records, labs, notes, testing and imaging myself where available.  Lab Results  Component Value Date   WBC 10.4 01/22/2024   HGB 11.9 (L) 01/22/2024   HCT 36.3 (L) 01/22/2024   MCV 89.2 01/22/2024   PLT 328 01/22/2024      Component Value Date/Time   NA 135 01/22/2024 2242   K 3.2 (L) 01/22/2024 2242   CL 101 01/22/2024 2242   CO2 22 01/22/2024 2242   GLUCOSE 147 (H) 01/22/2024 2242   BUN 6 01/22/2024 2242   CREATININE 0.74 01/22/2024 2242   CALCIUM 8.7 (L) 01/22/2024 2242   PROT 7.7 01/23/2024 1022   ALBUMIN 2.9 (L) 01/23/2024 1022   AST 77 (H) 01/23/2024 1022   ALT 70 (H) 01/23/2024 1022   ALKPHOS 114 01/23/2024 1022   BILITOT 0.7 01/23/2024 1022   GFRNONAA >60 01/22/2024 2242   GFRAA >60 02/02/2020 2155   Lab Results  Component Value Date   CHOL 96 12/08/2023   HDL 34 (L) 12/08/2023   LDLCALC 51 12/08/2023   TRIG 55 12/08/2023   CHOLHDL 2.8 12/08/2023   Lab Results  Component Value Date   HGBA1C 6.0 (H) 12/08/2023   Lab Results  Component Value Date   VITAMINB12 549 11/25/2023   Lab Results  Component Value Date   TSH 0.714 12/08/2023    11/19/2023 Normal MRI of the lumbar spine. No evidence for acute infection or other abnormality. No significant disc pathology, stenosis, or evidence for neural  impingement.   01/06/24 MRI brain [I reviewed images myself and agree with interpretation. -VRP]  - Normal noncontrast MRI appearance of the brain.    01/06/24 MRI lumbar [I reviewed images myself and agree with interpretation. -VRP]  1. Limited evaluation today due to largely nondiagnostic axial imaging. 2. New abnormal lower lumbar erector spinae muscle signal which could be inflammatory versus posttraumatic muscle edema. No paraspinal fluid collection is evident. 3. Otherwise sagittal noncontrast lumbar spine images appears stable to that in February, normal for age.     ASSESSMENT AND PLAN  33 y.o. year old male here with:   Dx:  1. Left leg pain   2. Cellulitis of left foot       PLAN:  LEFT THIGH PAIN / NUMBNESS (since 10/19/23) - also left foot swelling, redness, calor (now concern for cellulitis of left foot; also abnormal STIR signal in the lumbar paraspinal muscles) - check MRI left femur - check BLE u/s (rule out DVT) - recommended ER evaluation to expedite workup, especially since he does not have PCP; otherwise needs ASAP medical evaluation with new PCP   Orders Placed This Encounter  Procedures   US  Venous Img Lower Bilateral (DVT)   MR FEMUR LEFT W WO CONTRAST   No follow-ups on file.    Omega Bible, MD 01/26/2024, 2:14 PM Certified in Neurology, Neurophysiology and Neuroimaging  [facility] [address], [address] [place], [place] [address] [phone]

## 2024-01-26 ENCOUNTER — Emergency Department (HOSPITAL_COMMUNITY): Payer: MEDICAID

## 2024-01-26 ENCOUNTER — Encounter (HOSPITAL_COMMUNITY): Payer: Self-pay | Admitting: *Deleted

## 2024-01-26 ENCOUNTER — Other Ambulatory Visit: Payer: Self-pay

## 2024-01-26 ENCOUNTER — Encounter (HOSPITAL_COMMUNITY): Payer: Self-pay

## 2024-01-26 ENCOUNTER — Emergency Department (HOSPITAL_COMMUNITY)
Admission: EM | Admit: 2024-01-26 | Discharge: 2024-01-26 | Disposition: A | Discharge status: Home / Self Care | Payer: MEDICAID | Attending: Emergency Medicine | Admitting: Emergency Medicine

## 2024-01-26 ENCOUNTER — Emergency Department (HOSPITAL_COMMUNITY)
Admission: EM | Admit: 2024-01-26 | Discharge: 2024-01-27 | Disposition: A | Discharge status: Home / Self Care | Payer: MEDICAID | Attending: Emergency Medicine | Admitting: Emergency Medicine

## 2024-01-26 DIAGNOSIS — Y92410 Unspecified street and highway as the place of occurrence of the external cause: Secondary | ICD-10-CM | POA: Diagnosis not present

## 2024-01-26 DIAGNOSIS — Z765 Malingerer [conscious simulation]: Secondary | ICD-10-CM | POA: Insufficient documentation

## 2024-01-26 DIAGNOSIS — R45851 Suicidal ideations: Secondary | ICD-10-CM | POA: Insufficient documentation

## 2024-01-26 DIAGNOSIS — R0981 Nasal congestion: Secondary | ICD-10-CM | POA: Insufficient documentation

## 2024-01-26 DIAGNOSIS — S91201A Unspecified open wound of right great toe with damage to nail, initial encounter: Secondary | ICD-10-CM | POA: Insufficient documentation

## 2024-01-26 DIAGNOSIS — Y903 Blood alcohol level of 60-79 mg/100 ml: Secondary | ICD-10-CM | POA: Insufficient documentation

## 2024-01-26 DIAGNOSIS — R059 Cough, unspecified: Secondary | ICD-10-CM | POA: Insufficient documentation

## 2024-01-26 DIAGNOSIS — W228XXA Striking against or struck by other objects, initial encounter: Secondary | ICD-10-CM | POA: Diagnosis not present

## 2024-01-26 DIAGNOSIS — Y9301 Activity, walking, marching and hiking: Secondary | ICD-10-CM | POA: Insufficient documentation

## 2024-01-26 DIAGNOSIS — Z23 Encounter for immunization: Secondary | ICD-10-CM | POA: Insufficient documentation

## 2024-01-26 DIAGNOSIS — M79674 Pain in right toe(s): Secondary | ICD-10-CM

## 2024-01-26 DIAGNOSIS — R509 Fever, unspecified: Secondary | ICD-10-CM | POA: Insufficient documentation

## 2024-01-26 DIAGNOSIS — D72829 Elevated white blood cell count, unspecified: Secondary | ICD-10-CM | POA: Insufficient documentation

## 2024-01-26 LAB — CBC WITH DIFFERENTIAL/PLATELET
Abs Immature Granulocytes: 0.03 10*3/uL (ref 0.00–0.07)
Basophils Absolute: 0.1 10*3/uL (ref 0.0–0.1)
Basophils Relative: 1 %
Eosinophils Absolute: 0 10*3/uL (ref 0.0–0.5)
Eosinophils Relative: 0 %
HCT: 33.5 % — ABNORMAL LOW (ref 39.0–52.0)
Hemoglobin: 11.1 g/dL — ABNORMAL LOW (ref 13.0–17.0)
Immature Granulocytes: 0 %
Lymphocytes Relative: 19 %
Lymphs Abs: 2.1 10*3/uL (ref 0.7–4.0)
MCH: 29.6 pg (ref 26.0–34.0)
MCHC: 33.1 g/dL (ref 30.0–36.0)
MCV: 89.3 fL (ref 80.0–100.0)
Monocytes Absolute: 1 10*3/uL (ref 0.1–1.0)
Monocytes Relative: 9 %
Neutro Abs: 8 10*3/uL — ABNORMAL HIGH (ref 1.7–7.7)
Neutrophils Relative %: 71 %
Platelets: 367 10*3/uL (ref 150–400)
RBC: 3.75 MIL/uL — ABNORMAL LOW (ref 4.22–5.81)
RDW: 15 % (ref 11.5–15.5)
WBC: 11.2 10*3/uL — ABNORMAL HIGH (ref 4.0–10.5)
nRBC: 0 % (ref 0.0–0.2)

## 2024-01-26 LAB — URINALYSIS, ROUTINE W REFLEX MICROSCOPIC
Bacteria, UA: NONE SEEN
Bilirubin Urine: NEGATIVE
Glucose, UA: NEGATIVE mg/dL
Ketones, ur: NEGATIVE mg/dL
Leukocytes,Ua: NEGATIVE
Nitrite: NEGATIVE
Protein, ur: NEGATIVE mg/dL
Specific Gravity, Urine: 1.012 (ref 1.005–1.030)
pH: 5 (ref 5.0–8.0)

## 2024-01-26 MED ORDER — TETANUS-DIPHTH-ACELL PERTUSSIS 5-2.5-18.5 LF-MCG/0.5 IM SUSY
0.5000 mL | PREFILLED_SYRINGE | Freq: Once | INTRAMUSCULAR | Status: AC
  Administered 2024-01-26: 0.5 mL via INTRAMUSCULAR
  Filled 2024-01-26: qty 0.5

## 2024-01-26 NOTE — ED Notes (Signed)
 According to the pa  the pt will be worked up medically before a decision is made about behavorial

## 2024-01-26 NOTE — ED Notes (Signed)
 Pt assisted in getting his clothes on and gathering his belongings. At that time, pt refused to leave the ED. Security was called and escorted the pt out via wheelchair.

## 2024-01-26 NOTE — ED Triage Notes (Signed)
 Pt BIB EMS from [facility]. Pt stubbed his right foot big toe while walking across the street.

## 2024-01-26 NOTE — ED Triage Notes (Signed)
 The pt reports that he was just ejected from [facility].  He reports feeling  suicidal and he stumped his rt great toe  he also reported that when he reported being suicidal that's when they had him  taken off the property

## 2024-01-26 NOTE — ED Provider Notes (Signed)
 [facility] Provider Note   CSN: [id number] Arrival date & time: 01/26/24  1833     History  Chief Complaint  Patient presents with   Foot Pain    Mark Hammond is a 33 y.o. male.  33 year old male here today after he stubbed his right great toe while walking across the street.   Foot Pain       Home Medications Prior to Admission medications   Medication Sig Start Date End Date Taking? Authorizing Provider  acetaminophen  (TYLENOL ) 500 MG tablet Take 500 mg by mouth every 6 (six) hours as needed.    [provider]  ferrous sulfate  325 (65 FE) MG tablet Take 1 tablet (325 mg total) by mouth daily with breakfast. 01/17/24   Joice Nares, MD  gabapentin  (NEURONTIN ) 300 MG capsule Take 1 capsule (300 mg total) by mouth 2 (two) times daily. 01/17/24   Joice Nares, MD  traZODone  (DESYREL ) 50 MG tablet Take 1 tablet (50 mg total) by mouth at bedtime as needed and may repeat dose one time if needed for sleep. 01/17/24   Joice Nares, MD      Allergies    Codeine    Review of Systems   Review of Systems  Physical Exam Updated Vital Signs BP (!) 143/95 (BP Location: Left Arm)   Pulse (!) 118   Temp 98.4 F (36.9 C) (Oral)   Resp 17   Ht 5\' 3"  (1.6 m)   Wt 56.7 kg   SpO2 99%   BMI 22.14 kg/m  Physical Exam Vitals reviewed.  Constitutional:      Appearance: He is not ill-appearing.  Musculoskeletal:        General: Normal range of motion.     Comments: Patient with a slight avulsion of the nail of the great toe on the right.  There is no subungual hematoma.  Neurological:     General: No focal deficit present.     Mental Status: He is alert.     ED Results / Procedures / Treatments   Labs (all labs ordered are listed, but only abnormal results are displayed) Labs Reviewed - No data to display  EKG None  Radiology DG Foot 2 Views Right Result Date: 01/26/2024 CLINICAL DATA:  Right foot pain  EXAM: RIGHT FOOT - 2 VIEW COMPARISON:  None Available. FINDINGS: There is no evidence of fracture or dislocation. There is no evidence of arthropathy or other focal bone abnormality. Soft tissues are unremarkable. IMPRESSION: Negative. Electronically Signed   By: Worthy Heads M.D.   On: 01/26/2024 21:08    Procedures Procedures    Medications Ordered in ED Medications  Tdap (BOOSTRIX ) injection 0.5 mL (has no administration in time range)    ED Course/ Medical Decision Making/ A&P                                 Medical Decision Making 33 year old male here today after he stubbed his right toe.  Plan -minor avulsion of the great toe.  No intervention required.  Plain films negative for fracture.  Tdap provided.  Patient requesting MRI and blood work for his outpatient neurologist.  Splane to the patient that this was not what we did in the emergency room.  I reviewed the patient's most recent ED visits.  Reviewed his medications.  This patient's health care is complicated by the following  social determinants of health-housing insecurity.  Amount and/or Complexity of Data Reviewed Radiology: ordered.  Risk Prescription drug management.           Final Clinical Impression(s) / ED Diagnoses Final diagnoses:  Pain of right great toe    Rx / DC Orders ED Discharge Orders     None         Mark Baker, DO 01/26/24 2139

## 2024-01-27 ENCOUNTER — Emergency Department (HOSPITAL_COMMUNITY): Payer: MEDICAID

## 2024-01-27 LAB — RAPID URINE DRUG SCREEN, HOSP PERFORMED
Amphetamines: NOT DETECTED
Barbiturates: NOT DETECTED
Benzodiazepines: NOT DETECTED
Cocaine: POSITIVE — AB
Opiates: NOT DETECTED
Tetrahydrocannabinol: NOT DETECTED

## 2024-01-27 LAB — COMPREHENSIVE METABOLIC PANEL WITH GFR
ALT: 60 U/L — ABNORMAL HIGH (ref 0–44)
AST: 90 U/L — ABNORMAL HIGH (ref 15–41)
Albumin: 2.9 g/dL — ABNORMAL LOW (ref 3.5–5.0)
Alkaline Phosphatase: 116 U/L (ref 38–126)
Anion gap: 12 (ref 5–15)
BUN: 5 mg/dL — ABNORMAL LOW (ref 6–20)
CO2: 25 mmol/L (ref 22–32)
Calcium: 8.1 mg/dL — ABNORMAL LOW (ref 8.9–10.3)
Chloride: 95 mmol/L — ABNORMAL LOW (ref 98–111)
Creatinine, Ser: 0.48 mg/dL — ABNORMAL LOW (ref 0.61–1.24)
GFR, Estimated: >60 mL/min (ref >60)
Glucose, Bld: 121 mg/dL — ABNORMAL HIGH (ref 70–99)
Potassium: 3.6 mmol/L (ref 3.5–5.1)
Sodium: 132 mmol/L — ABNORMAL LOW (ref 135–145)
Total Bilirubin: 0.4 mg/dL (ref 0.0–1.2)
Total Protein: 7.3 g/dL (ref 6.5–8.1)

## 2024-01-27 LAB — ETHANOL: Alcohol, Ethyl (B): 74 mg/dL — ABNORMAL HIGH (ref <15)

## 2024-01-27 LAB — RESP PANEL BY RT-PCR (RSV, FLU A&B, COVID)  RVPGX2
Influenza A by PCR: NEGATIVE
Influenza B by PCR: NEGATIVE
Resp Syncytial Virus by PCR: NEGATIVE
SARS Coronavirus 2 by RT PCR: NEGATIVE

## 2024-01-27 NOTE — ED Provider Notes (Signed)
 [place] EMERGENCY DEPARTMENT AT [facility] Provider Note  CSN: [id number] Arrival date & time: 01/26/24  2245   History  Chief Complaint  Patient presents with   Fever   Mark Hammond is a 33 y.o. male who presents emergency department for his 22nd visit to the ED this year.  He is states that he was kicked out of [facility] tonight "after telling them I was suicidal". Per chart review, patient was evaluated for toe injury, but security was called when he refused to leave the ER.   Per chart review, patient frequently reports suicidality when having challenges with housing, after evaluation for his chronic leg pain.  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  Does endorse congestion and cough for the last few days but denies any shortness of breath, chest pain, nausea vomiting abdominal pain diarrhea fevers or chills in the outpatient setting.  Patient is very calm cooperative and eating exam at time my evaluation.  HPI     Home Medications Prior to Admission medications   Medication Sig Start Date End Date Taking? Authorizing Provider  acetaminophen  (TYLENOL ) 500 MG tablet Take 500 mg by mouth every 6 (six) hours as needed.    [provider]  ferrous sulfate  325 (65 FE) MG tablet Take 1 tablet (325 mg total) by mouth daily with breakfast. 01/17/24   Joice Nares, MD  gabapentin  (NEURONTIN ) 300 MG capsule Take 1 capsule (300 mg total) by mouth 2 (two) times daily. 01/17/24   Joice Nares, MD  traZODone  (DESYREL ) 50 MG tablet Take 1 tablet (50 mg total) by mouth at bedtime as needed and may repeat dose one time if needed for sleep. 01/17/24   Joice Nares, MD      Allergies    Codeine    Review of Systems   Review of Systems  Psychiatric/Behavioral:  Positive for suicidal ideas.     Physical Exam Updated Vital Signs BP 112/64 (BP Location: Left Arm)   Pulse (!) 103   Temp 98.8  F (37.1 C) (Oral)   Resp 18   Ht 5\' 3"  (1.6 m)   Wt 56.7 kg   SpO2 100%   BMI 22.14 kg/m  Physical Exam Vitals and nursing note reviewed.  Constitutional:      Appearance: He is not ill-appearing or toxic-appearing.     Comments: Well-appearing  HENT:     Head: Normocephalic and atraumatic.     Nose: Congestion present.     Mouth/Throat:     Mouth: Mucous membranes are moist.     Pharynx: No oropharyngeal exudate or posterior oropharyngeal erythema.  Eyes:     General:        Right eye: No discharge.        Left eye: No discharge.     Conjunctiva/sclera: Conjunctivae normal.  Cardiovascular:     Rate and Rhythm: Normal rate and regular rhythm.     Pulses: Normal pulses.     Heart sounds: Normal heart sounds. No murmur heard. Pulmonary:     Effort: Pulmonary effort is normal. No respiratory distress.     Breath sounds: Normal breath sounds. No wheezing or rales.  Abdominal:     General: Bowel sounds are normal. There is no distension.     Palpations: Abdomen is soft.     Tenderness: There is no abdominal tenderness. There is no guarding or  rebound.  Musculoskeletal:        General: No deformity.     Cervical back: Neck supple.     Right lower leg: No edema.     Left lower leg: No edema.       Feet:  Skin:    General: Skin is warm and dry.     Capillary Refill: Capillary refill takes less than 2 seconds.  Neurological:     General: No focal deficit present.     Mental Status: He is alert and oriented to person, place, and time. Mental status is at baseline.  Psychiatric:        Attention and Perception: Attention normal.        Mood and Affect: Mood normal.        Speech: Speech normal.        Behavior: Behavior normal. Behavior is cooperative.        Thought Content: Thought content includes suicidal ideation. Thought content does not include homicidal ideation. Thought content does not include suicidal plan.     Comments: Does not appear to be responding to  internal stimuli.     1`ED Results / Procedures / Treatments   Labs (all labs ordered are listed, but only abnormal results are displayed) Labs Reviewed  COMPREHENSIVE METABOLIC PANEL WITH GFR - Abnormal; Notable for the following components:      Result Value   Sodium 132 (*)    Chloride 95 (*)    Glucose, Bld 121 (*)    BUN 5 (*)    Creatinine, Ser 0.48 (*)    Calcium 8.1 (*)    Albumin 2.9 (*)    AST 90 (*)    ALT 60 (*)    All other components within normal limits  ETHANOL - Abnormal; Notable for the following components:   Alcohol, Ethyl (B) 74 (*)    All other components within normal limits  RAPID URINE DRUG SCREEN, HOSP PERFORMED - Abnormal; Notable for the following components:   Cocaine  POSITIVE (*)    All other components within normal limits  CBC WITH DIFFERENTIAL/PLATELET - Abnormal; Notable for the following components:   WBC 11.2 (*)    RBC 3.75 (*)    Hemoglobin 11.1 (*)    HCT 33.5 (*)    Neutro Abs 8.0 (*)    All other components within normal limits  URINALYSIS, ROUTINE W REFLEX MICROSCOPIC - Abnormal; Notable for the following components:   Hgb urine dipstick SMALL (*)    All other components within normal limits  RESP PANEL BY RT-PCR (RSV, FLU A&B, COVID)  [id number]    EKG EKG Interpretation Date/Time:  Friday January 26 2024 23:01:37 EDT Ventricular Rate:  128 PR Interval:  124 QRS Duration:  78 QT Interval:  314 QTC Calculation: 458 R Axis:   59  Text Interpretation: Sinus tachycardia Otherwise normal ECG Confirmed by Kelsey Patricia [phone]) on 01/26/2024 11:37:42 PM  Radiology DG Chest 1 View Result Date: 01/27/2024 CLINICAL DATA:  Suicidal ideation EXAM: CHEST  1 VIEW COMPARISON:  03/05/2023 FINDINGS: Lungs are clear.  No pleural effusion or pneumothorax. The heart is normal in size. IMPRESSION: No acute cardiopulmonary disease. Electronically Signed   By: Zadie Herter M.D.   On: 01/27/2024 00:13   DG Foot 2 Views Right Result Date:  01/26/2024 CLINICAL DATA:  Right foot pain EXAM: RIGHT FOOT - 2 VIEW COMPARISON:  None Available. FINDINGS: There is no evidence of fracture or dislocation. There is no evidence of  arthropathy or other focal bone abnormality. Soft tissues are unremarkable. IMPRESSION: Negative. Electronically Signed   By: Worthy Heads M.D.   On: 01/26/2024 21:08    Procedures Procedures    Medications Ordered in ED Medications - No data to display  ED Course/ Medical Decision Making/ A&P                                 Medical Decision Making 33 year old male reports passive suicidality.  Febrile and tachycardic on intake, cardiopulmonary abdominal exams unremarkable.  Patient with nasal congestion.  Neurovascular intact, very well-appearing.  He is not showing to internal stimuli at this time, calm and cooperative.    Amount and/or Complexity of Data Reviewed Labs:     Details: CBC with mild leukocytosis of 11, anemia near patient's baseline.  CMP with mild hyponatremia 132, transaminitis with AST/ALT 90/60 near patient's baseline.  UA with small hemoglobin and positive for cocaine , otherwise unremarkable.         Radiology:     Details: Chest x-ray negative for acute cardiopulmonary disease.  ECG/medicine tests:     Details:  EKG with sinus tach.   At time of my arrival to the bedside patient's fever and tachycardia have completely resolved.  Normal temp orally.  Well-appearing.Patient is calm, cooperative, and resting comfortably.  High clinical suspicion for malingering behavior given patient's frequent ED visits for similar complaints of passive suicidality following ED visits for acute on chronic pain.  Clinical concern for emergent psychiatric crisis that would warrant further ED workup and emergent psychiatric evaluation is exceedingly low.  Clinical picture most consistent with malingering for secondary gain of safe housing.  Patient may follow-up in the behavioral health urgent care  outpatient as needed.  No further workup warranted in the ER at this time.  In regard to patient's fever and tachycardia intake, suspect viral syndrome given patient's reported congestion and nonproductive cough.  Reyce  voiced understanding of his medical evaluation and treatment plan. Each of their questions answered to their expressed satisfaction.  Return precautions were given.  Patient is well-appearing, stable, and was discharged in good condition.  This chart was dictated using voice recognition software, Dragon. Despite the best efforts of this provider to proofread and correct errors, errors may still occur which can change documentation meaning.         Final Clinical Impression(s) / ED Diagnoses Final diagnoses:  None    Rx / DC Orders ED Discharge Orders     None         Arlyne Lame 01/27/24 0255    Lindle Rhea, MD 01/27/24 [phone]

## 2024-01-27 NOTE — Discharge Instructions (Addendum)
 You are seen in the ER today for your suicidal thoughts.  Please follow-up as needed with the behavioral health urgent care listed below.  Return to the ER with any new severe symptoms.

## 2024-01-31 ENCOUNTER — Emergency Department (HOSPITAL_COMMUNITY): Payer: MEDICAID

## 2024-01-31 ENCOUNTER — Inpatient Hospital Stay (HOSPITAL_COMMUNITY)
Admission: EM | Admit: 2024-01-31 | Discharge: 2024-02-16 | DRG: 871 | Disposition: A | Discharge status: Home / Self Care | Payer: MEDICAID | Attending: Internal Medicine | Admitting: Internal Medicine

## 2024-01-31 ENCOUNTER — Encounter (HOSPITAL_COMMUNITY): Payer: Self-pay

## 2024-01-31 ENCOUNTER — Other Ambulatory Visit: Payer: Self-pay

## 2024-01-31 DIAGNOSIS — K651 Peritoneal abscess: Secondary | ICD-10-CM | POA: Diagnosis not present

## 2024-01-31 DIAGNOSIS — F17210 Nicotine dependence, cigarettes, uncomplicated: Secondary | ICD-10-CM | POA: Diagnosis present

## 2024-01-31 DIAGNOSIS — E871 Hypo-osmolality and hyponatremia: Secondary | ICD-10-CM | POA: Diagnosis present

## 2024-01-31 DIAGNOSIS — F1510 Other stimulant abuse, uncomplicated: Secondary | ICD-10-CM | POA: Diagnosis present

## 2024-01-31 DIAGNOSIS — F1410 Cocaine abuse, uncomplicated: Secondary | ICD-10-CM | POA: Diagnosis present

## 2024-01-31 DIAGNOSIS — F259 Schizoaffective disorder, unspecified: Secondary | ICD-10-CM | POA: Diagnosis present

## 2024-01-31 DIAGNOSIS — L03116 Cellulitis of left lower limb: Secondary | ICD-10-CM

## 2024-01-31 DIAGNOSIS — N50812 Left testicular pain: Secondary | ICD-10-CM | POA: Diagnosis present

## 2024-01-31 DIAGNOSIS — R6521 Severe sepsis with septic shock: Secondary | ICD-10-CM | POA: Diagnosis not present

## 2024-01-31 DIAGNOSIS — Z6823 Body mass index (BMI) 23.0-23.9, adult: Secondary | ICD-10-CM

## 2024-01-31 DIAGNOSIS — F05 Delirium due to known physiological condition: Secondary | ICD-10-CM | POA: Diagnosis not present

## 2024-01-31 DIAGNOSIS — R6520 Severe sepsis without septic shock: Secondary | ICD-10-CM

## 2024-01-31 DIAGNOSIS — I38 Endocarditis, valve unspecified: Secondary | ICD-10-CM | POA: Diagnosis not present

## 2024-01-31 DIAGNOSIS — F319 Bipolar disorder, unspecified: Secondary | ICD-10-CM | POA: Diagnosis present

## 2024-01-31 DIAGNOSIS — M60052 Infective myositis, left thigh: Secondary | ICD-10-CM | POA: Diagnosis present

## 2024-01-31 DIAGNOSIS — G9341 Metabolic encephalopathy: Secondary | ICD-10-CM | POA: Diagnosis not present

## 2024-01-31 DIAGNOSIS — F1210 Cannabis abuse, uncomplicated: Secondary | ICD-10-CM | POA: Diagnosis present

## 2024-01-31 DIAGNOSIS — Z5901 Sheltered homelessness: Secondary | ICD-10-CM

## 2024-01-31 DIAGNOSIS — E440 Moderate protein-calorie malnutrition: Secondary | ICD-10-CM | POA: Diagnosis present

## 2024-01-31 DIAGNOSIS — M86652 Other chronic osteomyelitis, left thigh: Secondary | ICD-10-CM | POA: Diagnosis present

## 2024-01-31 DIAGNOSIS — A419 Sepsis, unspecified organism: Principal | ICD-10-CM

## 2024-01-31 DIAGNOSIS — B9561 Methicillin susceptible Staphylococcus aureus infection as the cause of diseases classified elsewhere: Secondary | ICD-10-CM | POA: Diagnosis present

## 2024-01-31 DIAGNOSIS — K7010 Alcoholic hepatitis without ascites: Secondary | ICD-10-CM | POA: Diagnosis present

## 2024-01-31 DIAGNOSIS — J189 Pneumonia, unspecified organism: Secondary | ICD-10-CM | POA: Diagnosis present

## 2024-01-31 DIAGNOSIS — R7401 Elevation of levels of liver transaminase levels: Secondary | ICD-10-CM | POA: Diagnosis not present

## 2024-01-31 DIAGNOSIS — R531 Weakness: Secondary | ICD-10-CM | POA: Diagnosis not present

## 2024-01-31 DIAGNOSIS — E872 Acidosis, unspecified: Secondary | ICD-10-CM | POA: Diagnosis present

## 2024-01-31 DIAGNOSIS — B356 Tinea cruris: Secondary | ICD-10-CM | POA: Diagnosis present

## 2024-01-31 DIAGNOSIS — E876 Hypokalemia: Secondary | ICD-10-CM | POA: Diagnosis present

## 2024-01-31 DIAGNOSIS — F1010 Alcohol abuse, uncomplicated: Secondary | ICD-10-CM | POA: Diagnosis not present

## 2024-01-31 DIAGNOSIS — M009 Pyogenic arthritis, unspecified: Secondary | ICD-10-CM | POA: Diagnosis present

## 2024-01-31 DIAGNOSIS — F1910 Other psychoactive substance abuse, uncomplicated: Secondary | ICD-10-CM | POA: Diagnosis not present

## 2024-01-31 DIAGNOSIS — D638 Anemia in other chronic diseases classified elsewhere: Secondary | ICD-10-CM

## 2024-01-31 DIAGNOSIS — W03XXXA Other fall on same level due to collision with another person, initial encounter: Secondary | ICD-10-CM | POA: Diagnosis present

## 2024-01-31 DIAGNOSIS — F10239 Alcohol dependence with withdrawal, unspecified: Secondary | ICD-10-CM | POA: Diagnosis not present

## 2024-01-31 DIAGNOSIS — Z885 Allergy status to narcotic agent status: Secondary | ICD-10-CM

## 2024-01-31 DIAGNOSIS — Z79899 Other long term (current) drug therapy: Secondary | ICD-10-CM

## 2024-01-31 DIAGNOSIS — F10931 Alcohol use, unspecified with withdrawal delirium: Secondary | ICD-10-CM | POA: Diagnosis not present

## 2024-01-31 DIAGNOSIS — M00852 Arthritis due to other bacteria, left hip: Secondary | ICD-10-CM | POA: Diagnosis not present

## 2024-01-31 DIAGNOSIS — B372 Candidiasis of skin and nail: Secondary | ICD-10-CM | POA: Diagnosis not present

## 2024-01-31 DIAGNOSIS — Z823 Family history of stroke: Secondary | ICD-10-CM

## 2024-01-31 LAB — URINALYSIS, ROUTINE W REFLEX MICROSCOPIC
Bacteria, UA: NONE SEEN
Bilirubin Urine: NEGATIVE
Glucose, UA: NEGATIVE mg/dL
Hgb urine dipstick: NEGATIVE
Ketones, ur: 80 mg/dL — AB
Leukocytes,Ua: NEGATIVE
Nitrite: NEGATIVE
Protein, ur: 30 mg/dL — AB
Specific Gravity, Urine: 1.018 (ref 1.005–1.030)
pH: 5 (ref 5.0–8.0)

## 2024-01-31 LAB — COMPREHENSIVE METABOLIC PANEL WITH GFR
ALT: 61 U/L — ABNORMAL HIGH (ref 0–44)
AST: 116 U/L — ABNORMAL HIGH (ref 15–41)
Albumin: 3.1 g/dL — ABNORMAL LOW (ref 3.5–5.0)
Alkaline Phosphatase: 141 U/L — ABNORMAL HIGH (ref 38–126)
Anion gap: 17 — ABNORMAL HIGH (ref 5–15)
BUN: 8 mg/dL (ref 6–20)
CO2: 23 mmol/L (ref 22–32)
Calcium: 9 mg/dL (ref 8.9–10.3)
Chloride: 93 mmol/L — ABNORMAL LOW (ref 98–111)
Creatinine, Ser: 0.57 mg/dL — ABNORMAL LOW (ref 0.61–1.24)
GFR, Estimated: >60 mL/min (ref >60)
Glucose, Bld: 97 mg/dL (ref 70–99)
Potassium: 3.3 mmol/L — ABNORMAL LOW (ref 3.5–5.1)
Sodium: 133 mmol/L — ABNORMAL LOW (ref 135–145)
Total Bilirubin: 1.4 mg/dL — ABNORMAL HIGH (ref 0.0–1.2)
Total Protein: 8.4 g/dL — ABNORMAL HIGH (ref 6.5–8.1)

## 2024-01-31 LAB — CBC WITH DIFFERENTIAL/PLATELET
Abs Immature Granulocytes: 0.05 10*3/uL (ref 0.00–0.07)
Basophils Absolute: 0.1 10*3/uL (ref 0.0–0.1)
Basophils Relative: 1 %
Eosinophils Absolute: 0 10*3/uL (ref 0.0–0.5)
Eosinophils Relative: 0 %
HCT: 32.9 % — ABNORMAL LOW (ref 39.0–52.0)
Hemoglobin: 11 g/dL — ABNORMAL LOW (ref 13.0–17.0)
Immature Granulocytes: 0 %
Lymphocytes Relative: 14 %
Lymphs Abs: 1.8 10*3/uL (ref 0.7–4.0)
MCH: 29.5 pg (ref 26.0–34.0)
MCHC: 33.4 g/dL (ref 30.0–36.0)
MCV: 88.2 fL (ref 80.0–100.0)
Monocytes Absolute: 1.2 10*3/uL — ABNORMAL HIGH (ref 0.1–1.0)
Monocytes Relative: 9 %
Neutro Abs: 10.3 10*3/uL — ABNORMAL HIGH (ref 1.7–7.7)
Neutrophils Relative %: 76 %
Platelets: 497 10*3/uL — ABNORMAL HIGH (ref 150–400)
RBC: 3.73 MIL/uL — ABNORMAL LOW (ref 4.22–5.81)
RDW: 15.1 % (ref 11.5–15.5)
WBC: 13.5 10*3/uL — ABNORMAL HIGH (ref 4.0–10.5)
nRBC: 0 % (ref 0.0–0.2)

## 2024-01-31 LAB — RAPID URINE DRUG SCREEN, HOSP PERFORMED
Amphetamines: POSITIVE — AB
Barbiturates: NOT DETECTED
Benzodiazepines: NOT DETECTED
Cocaine: POSITIVE — AB
Opiates: POSITIVE — AB
Tetrahydrocannabinol: POSITIVE — AB

## 2024-01-31 LAB — C-REACTIVE PROTEIN: CRP: 21 mg/dL — ABNORMAL HIGH (ref <1.0)

## 2024-01-31 LAB — I-STAT CG4 LACTIC ACID, ED
Lactic Acid, Venous: 2.8 mmol/L (ref 0.5–1.9)
Lactic Acid, Venous: 1.1 mmol/L (ref 0.5–1.9)

## 2024-01-31 LAB — LIPASE, BLOOD: Lipase: 18 U/L (ref 11–51)

## 2024-01-31 LAB — MAGNESIUM: Magnesium: 1.7 mg/dL (ref 1.7–2.4)

## 2024-01-31 LAB — SEDIMENTATION RATE: Sed Rate: 77 mm/h — ABNORMAL HIGH (ref 0–16)

## 2024-01-31 LAB — CK: Total CK: 305 U/L (ref 49–397)

## 2024-01-31 LAB — ETHANOL: Alcohol, Ethyl (B): <15 mg/dL (ref <15)

## 2024-01-31 MED ORDER — SODIUM CHLORIDE 0.9 % IV BOLUS
1000.0000 mL | Freq: Once | INTRAVENOUS | Status: AC
  Administered 2024-01-31: 1000 mL via INTRAVENOUS

## 2024-01-31 MED ORDER — LORAZEPAM 1 MG PO TABS
1.0000 mg | ORAL_TABLET | ORAL | Status: AC | PRN
  Administered 2024-02-01: 2 mg via ORAL
  Filled 2024-01-31: qty 2

## 2024-01-31 MED ORDER — NALOXONE HCL 0.4 MG/ML IJ SOLN: 0.4000 mg | INTRAMUSCULAR | Status: DC | PRN

## 2024-01-31 MED ORDER — LACTATED RINGERS IV SOLN: INTRAVENOUS | Status: AC

## 2024-01-31 MED ORDER — ACETAMINOPHEN 500 MG PO TABS
1000.0000 mg | ORAL_TABLET | Freq: Once | ORAL | Status: AC
  Administered 2024-01-31: 1000 mg via ORAL
  Filled 2024-01-31: qty 2

## 2024-01-31 MED ORDER — ACETAMINOPHEN 325 MG PO TABS
650.0000 mg | ORAL_TABLET | Freq: Four times a day (QID) | ORAL | Status: DC | PRN
  Administered 2024-02-01 – 2024-02-16 (×11): 650 mg via ORAL
  Filled 2024-01-31 (×12): qty 2

## 2024-01-31 MED ORDER — THIAMINE HCL 100 MG/ML IJ SOLN
100.0000 mg | Freq: Every day | INTRAMUSCULAR | Status: DC
  Administered 2024-01-31 – 2024-02-07 (×8): 100 mg via INTRAVENOUS
  Filled 2024-01-31 (×8): qty 2

## 2024-01-31 MED ORDER — SODIUM CHLORIDE 0.9 % IV SOLN
1.0000 g | INTRAVENOUS | Status: DC
  Administered 2024-02-01 – 2024-02-04 (×4): 1 g via INTRAVENOUS
  Filled 2024-01-31 (×6): qty 10

## 2024-01-31 MED ORDER — MAGNESIUM SULFATE IN D5W 1-5 GM/100ML-% IV SOLN
1.0000 g | Freq: Once | INTRAVENOUS | Status: AC
  Administered 2024-02-01: 1 g via INTRAVENOUS
  Filled 2024-01-31: qty 100

## 2024-01-31 MED ORDER — CYCLOBENZAPRINE HCL 10 MG PO TABS
5.0000 mg | ORAL_TABLET | Freq: Once | ORAL | Status: AC
  Administered 2024-01-31: 5 mg via ORAL
  Filled 2024-01-31: qty 1

## 2024-01-31 MED ORDER — LORAZEPAM 2 MG/ML IJ SOLN
1.0000 mg | Freq: Once | INTRAMUSCULAR | Status: AC
  Administered 2024-01-31: 1 mg via INTRAVENOUS
  Filled 2024-01-31: qty 1

## 2024-01-31 MED ORDER — LORAZEPAM 0.5 MG PO TABS
0.5000 mg | ORAL_TABLET | Freq: Four times a day (QID) | ORAL | Status: DC | PRN
  Administered 2024-01-31: 0.5 mg via ORAL
  Filled 2024-01-31: qty 1

## 2024-01-31 MED ORDER — HYDROMORPHONE HCL 1 MG/ML IJ SOLN
0.5000 mg | INTRAMUSCULAR | Status: DC | PRN
  Administered 2024-01-31 – 2024-02-03 (×15): 0.5 mg via INTRAVENOUS
  Filled 2024-01-31 (×14): qty 1
  Filled 2024-01-31: qty 0.5
  Filled 2024-01-31: qty 1

## 2024-01-31 MED ORDER — ACETAMINOPHEN 650 MG RE SUPP
650.0000 mg | Freq: Four times a day (QID) | RECTAL | Status: DC | PRN
  Administered 2024-02-04: 650 mg via RECTAL
  Filled 2024-01-31 (×2): qty 1

## 2024-01-31 MED ORDER — SODIUM CHLORIDE 0.9 % IV SOLN
2.0000 g | Freq: Once | INTRAVENOUS | Status: AC
  Administered 2024-01-31: 2 g via INTRAVENOUS
  Filled 2024-01-31: qty 12.5

## 2024-01-31 MED ORDER — FERROUS SULFATE 325 (65 FE) MG PO TABS
325.0000 mg | ORAL_TABLET | Freq: Every day | ORAL | Status: DC
  Administered 2024-02-01 – 2024-02-16 (×16): 325 mg via ORAL
  Filled 2024-01-31 (×16): qty 1

## 2024-01-31 MED ORDER — VANCOMYCIN HCL IN DEXTROSE 1-5 GM/200ML-% IV SOLN
1000.0000 mg | Freq: Two times a day (BID) | INTRAVENOUS | Status: DC
  Administered 2024-02-01 – 2024-02-06 (×11): 1000 mg via INTRAVENOUS
  Filled 2024-01-31 (×11): qty 200

## 2024-01-31 MED ORDER — POTASSIUM CHLORIDE 10 MEQ/100ML IV SOLN
10.0000 meq | INTRAVENOUS | Status: AC
  Administered 2024-02-01 (×4): 10 meq via INTRAVENOUS
  Filled 2024-01-31 (×4): qty 100

## 2024-01-31 MED ORDER — NICOTINE 21 MG/24HR TD PT24: 21.0000 mg | MEDICATED_PATCH | Freq: Every day | TRANSDERMAL | Status: DC | PRN

## 2024-01-31 MED ORDER — ADULT MULTIVITAMIN W/MINERALS CH
1.0000 | ORAL_TABLET | Freq: Every day | ORAL | Status: DC
  Administered 2024-02-01 – 2024-02-16 (×16): 1 via ORAL
  Filled 2024-01-31 (×16): qty 1

## 2024-01-31 MED ORDER — LORAZEPAM 2 MG/ML IJ SOLN
1.0000 mg | INTRAMUSCULAR | Status: AC | PRN
  Administered 2024-01-31: 4 mg via INTRAVENOUS
  Administered 2024-02-01 (×4): 2 mg via INTRAVENOUS
  Administered 2024-02-01 (×2): 1 mg via INTRAVENOUS
  Administered 2024-02-01: 2 mg via INTRAVENOUS
  Administered 2024-02-01: 3 mg via INTRAVENOUS
  Administered 2024-02-02 (×4): 2 mg via INTRAVENOUS
  Administered 2024-02-02: 4 mg via INTRAVENOUS
  Administered 2024-02-02: 2 mg via INTRAVENOUS
  Administered 2024-02-02: 4 mg via INTRAVENOUS
  Administered 2024-02-03: 3 mg via INTRAVENOUS
  Administered 2024-02-03: 4 mg via INTRAVENOUS
  Administered 2024-02-03: 2 mg via INTRAVENOUS
  Administered 2024-02-03: 4 mg via INTRAVENOUS
  Filled 2024-01-31: qty 1
  Filled 2024-01-31 (×2): qty 2
  Filled 2024-01-31 (×3): qty 1
  Filled 2024-01-31: qty 2
  Filled 2024-01-31 (×2): qty 1
  Filled 2024-01-31 (×3): qty 2
  Filled 2024-01-31: qty 1
  Filled 2024-01-31 (×2): qty 2
  Filled 2024-01-31 (×5): qty 1

## 2024-01-31 MED ORDER — FOLIC ACID 1 MG PO TABS
1.0000 mg | ORAL_TABLET | Freq: Every day | ORAL | Status: DC
  Administered 2024-02-01 – 2024-02-16 (×16): 1 mg via ORAL
  Filled 2024-01-31 (×16): qty 1

## 2024-01-31 MED ORDER — MELATONIN 3 MG PO TABS
3.0000 mg | ORAL_TABLET | Freq: Every evening | ORAL | Status: DC | PRN
  Administered 2024-02-02 – 2024-02-13 (×4): 3 mg via ORAL
  Filled 2024-01-31 (×4): qty 1

## 2024-01-31 MED ORDER — POTASSIUM CHLORIDE CRYS ER 20 MEQ PO TBCR: 40.0000 meq | EXTENDED_RELEASE_TABLET | Freq: Once | ORAL | Status: DC

## 2024-01-31 MED ORDER — MORPHINE SULFATE (PF) 4 MG/ML IV SOLN
4.0000 mg | Freq: Once | INTRAVENOUS | Status: AC
  Administered 2024-01-31: 4 mg via INTRAVENOUS
  Filled 2024-01-31: qty 1

## 2024-01-31 MED ORDER — VANCOMYCIN HCL IN DEXTROSE 1-5 GM/200ML-% IV SOLN
1000.0000 mg | Freq: Once | INTRAVENOUS | Status: AC
  Administered 2024-01-31: 1000 mg via INTRAVENOUS
  Filled 2024-01-31: qty 200

## 2024-01-31 MED ORDER — ONDANSETRON HCL 4 MG/2ML IJ SOLN
4.0000 mg | Freq: Four times a day (QID) | INTRAMUSCULAR | Status: DC | PRN
  Filled 2024-01-31: qty 2

## 2024-01-31 MED ORDER — QUETIAPINE FUMARATE 100 MG PO TABS
100.0000 mg | ORAL_TABLET | Freq: Every day | ORAL | Status: DC
  Administered 2024-01-31 – 2024-02-09 (×9): 100 mg via ORAL
  Filled 2024-01-31 (×9): qty 1

## 2024-01-31 NOTE — ED Notes (Signed)
 Pt instructed to keep arm straight due to PIV in [facility] to help IVF run. Will provided 2nd bolus following 1st bolus completion.

## 2024-01-31 NOTE — H&P (Signed)
 History and Physical      Mark Hammond RUE:[id number] DOB: Jun 02, 1991 DOA: 01/31/2024; DOS: 01/31/2024  PCP: Patient, No Pcp Per (will further assess) Patient coming from: home   I have personally briefly reviewed patient's old medical records in [facility]  Chief Complaint: Left foot pain  HPI: Mark Hammond is a 33 y.o. male with medical history significant for bipolar disorder, polysubstance abuse, chronic alcohol abuse, chronic left thigh pain, chronic left thigh numbness, chronic transaminitis, who is admitted to [facility] on 01/31/2024 with suspected severe sepsis due to cellulitis of the left foot after presenting from home to [facility] ED complaining of left foot pain.   The following history is obtained from the patient as well as via my discussions with the EDP and via chart review.  The patient has been spearing seeing pain, numbness, paresthesias involving the left lower extremity in distribution between the left hip and the left knee since January 2025.  For evaluation of such, he has undergone MRI of the level lumbar spine in February 2025, which showed no evidence of acute process, in addition to MRI of the brain and February 2025, which also showed no evidence of acute process.  Additionally, for evaluation of his pain, numbness, paresthesias, he was recently evaluated by Dr. Salli Crawley Of outpatient neurology on 01/25/2024 who recommended further evaluation via MRI of the left femur.  This was ordered by Dr. Salli Crawley , although imaging has not yet occurred.  There was not a recommendation at that time for muscle biopsy.  The patient presents this evening complaining of erythema, pain, swelling, increased warmth to touch involving the left foot over the course the last week.  Denies any recent overt trauma to the left foot.  He also denies any associated subjective fever, chills, rigors, or generalized myalgias.  No new numbness or paresthesias involving the left lower  extremity no recent cough, shortness of breath or any recent chest pain.  The patient reports that in the setting of his more recent left foot erythema, tenderness that he is experiencing difficulty with ambulation due to associated poor pain control.  He has a history of polysubstance abuse, including cocaine  as well as amphetamines.  He also has a documented history of chronic alcohol abuse.  His medical history also includes chronic transaminitis dating back to 2021, with most recent prior liver enzymes checked on 01/26/2024, which were notable for the following findings at that time: Alkaline phosphatase 116, total bilirubin 0.4, AST 90, ALT 60.   ED Course:  Vital signs in the ED were notable for the following: Temperature max 99.9; heart rates initially in the 130s, subsequent decrease in the 120s following interval IV fluids, as further quantified below; systolic blood pressures in the 120s to 160s; respiratory rate 18-21, oxygen saturation 97 to 100% on room air.  Labs were notable for the following: CMP was notable for the following: Potassium 3.3, bicarbonate 23, creatinine 0.97, glucose 97, alkaline phosphatase 141, total bilirubin 1.4, AST 116, ALT 61.  Lipase 18.  Total CPK 305.  ESR 77.  CRP has been ordered, Therazole currently pending.  Lactic acid initially 2.8, 3 value trending down to 1.1.  CBC notable for the following: Lipid cell count 13,500 with 76% neutrophils, hemoglobin 11 associated Neuraceq/Norocarp properties, platelet count 497.  Urinalysis and urinary drug screen have been ordered, with results currently pending.  Serum ethanol level less than 15.  Blood cultures x 2 were collected prior to initiation of IV antibiotics.  Per my interpretation, EKG in ED demonstrated the following: Sinus tachycardia with heart rate 136, normal intervals, no evidence of T wave or ST changes, including evidence of ST elevation.  Imaging in the ED, per corresponding formal radiology read, was  notable for the following: MRI of the left femur with without contrast was ordered by EDP, with the study currently pending.  While in the ED, the following were administered: Acetaminophen  1 g p.o. x 1 dose, Flexeril  5 mg p.o. x 1 dose, morphine 4 mg IV x 1 dose, cefepime, IV vancomycin, normal saline x 2 L bolus, Ativan  0.5 mg p.o. x 1 dose.  Subsequently, the patient was admitted for further evaluation management of suspected severe sepsis due to cellulitis of the left foot, with presenting labs also notable for hypokalemia.    Review of Systems: As per HPI otherwise 10 point review of systems negative.   Past Medical History:  Diagnosis Date   Alcoholic gastritis    Bipolar 1 disorder (HCC)    Dental abscess    Depression    Drug abuse (HCC)    ETOH abuse    Hallucination    Schizo affective schizophrenia (HCC)     History reviewed. No pertinent surgical history.  Social History:  reports that he has been smoking cigarettes. He has been exposed to tobacco smoke. He has never used smokeless tobacco. He reports current alcohol use. He reports current drug use. Frequency: 7.00 times per week. Drugs: "Crack" cocaine , Heroin, MDMA (Ecstacy), Marijuana, and Cocaine .   Allergies  Allergen Reactions   Codeine Other (See Comments)    Patient states parents told him he was allergic at a young age.    Family History  Problem Relation Age of Onset   Stroke Other     Family history reviewed and not pertinent    Prior to Admission medications   Medication Sig Start Date End Date Taking? Authorizing Provider  acetaminophen  (TYLENOL ) 500 MG tablet Take 500 mg by mouth every 6 (six) hours as needed.    [provider]  ferrous sulfate  325 (65 FE) MG tablet Take 1 tablet (325 mg total) by mouth daily with breakfast. 01/17/24   Joice Nares, MD  gabapentin  (NEURONTIN ) 300 MG capsule Take 1 capsule (300 mg total) by mouth 2 (two) times daily. 01/17/24   Joice Nares, MD   traZODone  (DESYREL ) 50 MG tablet Take 1 tablet (50 mg total) by mouth at bedtime as needed and may repeat dose one time if needed for sleep. 01/17/24   Joice Nares, MD     Objective    Physical Exam: Vitals:   01/31/24 2004 01/31/24 2021 01/31/24 2030 01/31/24 2109  BP: (!) 161/63  124/77   Pulse: (!) 138  (!) 120   Resp: 18  (!) 21   Temp:    99.9 F (37.7 C)  TempSrc:    Oral  SpO2: 98% 98% 100%     General: appears to be stated age; somnolent Skin: warm, dry, erythema over the dorsal aspect of the left fingers warmth , in the absence of crepitus  Head:  AT/[place] Mouth:  Oral mucosa membranes appear dry, normal dentition Neck: supple; trachea midline Heart: Tachycardic, but regular; did not appreciate any M/R/G Lungs: CTAB, did not appreciate any wheezes, rales, or rhonchi Abdomen: + BS; soft, ND, NT Vascular: 2+ pedal pulses b/l; 2+ radial pulses b/l Extremities:  no muscle wasting; left foot erythema, tenderness, increased warmth, as further detailed above  Neuro: Diminished sensation specifically proximal aspect of the left upper extremity; current for pain control involving the left lower extremity, suspected associated strength was deferred at this time.     Labs on Admission: I have personally reviewed following labs and imaging studies  CBC: Recent Labs  Lab 01/26/24 2341 01/31/24 1934  WBC 11.2* 13.5*  NEUTROABS 8.0* 10.3*  HGB 11.1* 11.0*  HCT 33.5* 32.9*  MCV 89.3 88.2  PLT 367 497*   Basic Metabolic Panel: Recent Labs  Lab 01/26/24 2341 01/31/24 1934  NA 132* 133*  K 3.6 3.3*  CL 95* 93*  CO2 25 23  GLUCOSE 121* 97  BUN 5* 8  CREATININE 0.48* 0.57*  CALCIUM 8.1* 9.0   GFR: Estimated Creatinine Clearance: 106.3 mL/min (A) (by C-G formula based on SCr of 0.57 mg/dL (L)). Liver Function Tests: Recent Labs  Lab 01/26/24 2341 01/31/24 1934  AST 90* 116*  ALT 60* 61*  ALKPHOS 116 141*  BILITOT 0.4 1.4*  PROT 7.3 8.4*  ALBUMIN 2.9*  3.1*   Recent Labs  Lab 01/31/24 1934  LIPASE 18   No results for input(s): "AMMONIA" in the last 168 hours. Coagulation Profile: No results for input(s): "INR", "PROTIME" in the last 168 hours. Cardiac Enzymes: Recent Labs  Lab 01/31/24 1934  CKTOTAL 305   BNP (last 3 results) No results for input(s): "PROBNP" in the last 8760 hours. HbA1C: No results for input(s): "HGBA1C" in the last 72 hours. CBG: No results for input(s): "GLUCAP" in the last 168 hours. Lipid Profile: No results for input(s): "CHOL", "HDL", "LDLCALC", "TRIG", "CHOLHDL", "LDLDIRECT" in the last 72 hours. Thyroid  Function Tests: No results for input(s): "TSH", "T4TOTAL", "FREET4", "T3FREE", "THYROIDAB" in the last 72 hours. Anemia Panel: No results for input(s): "VITAMINB12", "FOLATE", "FERRITIN", "TIBC", "IRON", "RETICCTPCT" in the last 72 hours. Urine analysis:    Component Value Date/Time   COLORURINE YELLOW 01/26/2024 2342   APPEARANCEUR CLEAR 01/26/2024 2342   LABSPEC 1.012 01/26/2024 2342   PHURINE 5.0 01/26/2024 2342   GLUCOSEU NEGATIVE 01/26/2024 2342   HGBUR SMALL (A) 01/26/2024 2342   BILIRUBINUR NEGATIVE 01/26/2024 2342   BILIRUBINUR negative 09/05/2019 1004   KETONESUR NEGATIVE 01/26/2024 2342   PROTEINUR NEGATIVE 01/26/2024 2342   UROBILINOGEN 0.2 09/05/2019 1004   NITRITE NEGATIVE 01/26/2024 2342   LEUKOCYTESUR NEGATIVE 01/26/2024 2342    Radiological Exams on Admission: No results found.    Assessment/Plan    Principal Problem:   Cellulitis of left lower extremity Active Problems:   Polysubstance abuse (HCC)   Severe sepsis (HCC)   Hypokalemia   Bipolar 1 disorder (HCC)   Chronic alcohol abuse   Anemia of chronic disease   Transaminitis    #) Severe sepsis due to cellulitis of the left foot: Suspected on the basis of 1 week of progressive left foot erythema, tenderness, swelling, increased warmth.  Multiple small abrasions noted to be associated with a left lower  extremity, representing potential portal of entry.  Relative to his chronic left lower extremity numbness, paresthesias, no overt evidence of any acute sensory changes.  No evidence of crepitus to suggest underlying necrotizing fasciitis.  Will further assess for any evidence of subcutaneous gas or osteomyelitis by checking plain films of the left foot.  He has noted to have a mild elevation in his presenting ESR, CRP currently pending.  Of note, total CPK is not elevated, as quantified above.  SIRS criteria met via leukocytosis with neutrophilic predominance, tachycardia, tachypnea. Lactic acid level: Initially  2.8, with repeat value trending down to 1.1.. Of note, given the associated presence of suspected end organ damage in the form of concominant presenting lactic acidosis, criteria are met for pt's sepsis to be considered severe in nature. However, in the absence of lactic acid level that is greater than or equal to 4.0, and in the absence of any associated hypotension refractory to IVF's, there are no indications for administration of a 30 mL/kg IVF bolus at this time.   Additional ED work-up/management notable for: Blood cultures x 2 were collected prior to initiation of IV vancomycin and cefepime. Will continue IV vancomycin, and de-escalate cefepime slightly to Rocephin .  No e/o additional infectious process at this time, however urinalysis is currently pending at this time.  Plan: CBC w/ diff and CMP in AM.  Follow for results of blood cx's x 2. Abx: Continue IV vancomycin.  Start Rocephin , as above.  Lactated Ringer 's at 125 cc/h x 12 hours.  Check plain films of the left foot, as above.  Follow-up for results of CRP.  Prn IV Dilaudid.  Monitor on telemetry.  Follow-up result urinalysis.  Check chest x-ray.  As needed acetaminophen  for fever.                   #) Hypokalemia: presenting potassium level noted to be  3.3.    Plan: monitor on tele. KCl 40 meq iv over 4 hours.  Add-on serum mag level. CMP, mag level in the AM.                    #) Chronic left thigh pain/numbness/paresthesias: Documented history of such, dating back to January 2025, for which the patient has been following with outpatient neurology, he recommended MRI of the left femur, as above.  As MRI of the left femur has not yet been performed as an outpatient, EDP is ordered this imaging study this evening.  Etiology of these chronic symptoms is not entirely clear at this time, noting prior MRI of the lumbar spine as well as MRI of the brain from February 2025, which showed no evidence of acute process.  Will also attempt additional chart review, including review of the results from will recent MRI of the lumbar spine, which was performed during the first week of April 2025.  Plan: Follow-up for results of MRI of the left femur, as above.                  #) Bipolar disorder: Documented history of such, and scheduled Seroquel  nightly as an outpatient.  Plan: Resume home Seroquel .                  #) Polysubstance abuse: Documented history of such, including history of use of cocaine  and amphetamines.  Urinary drug screen has been ordered, with result currently pending.  Plan: Follow-up for result of urinary drug screen.                 #) Chronic Alcohol Abuse: Documented history of chronic alcohol abuse, with documentation that, at baseline, the patient consumes a 4 count of 40 ounce beers per day.  Timing of most recent alcohol consumption is not entirely clear at this time, although presenting serum ethanol level is nonelevated.  It appears, based on this history, that he is at increased risk for ensuing development of alcohol withdrawal. Will initiate CIWA protocol at this time. Close monitoring for development of evidence of alcohol withdrawal, including close  attention to trend in vital signs, as well as close monitoring of  electrolytes, as described below.   Plan: counseled the patient on the importance of reduction in alcohol consumption. Consult to transition of care team placed. Close monitoring of ensuing BP and HR via routine VS. Symptoms-based CIWA protocol with prn Ativan  ordered. Seizure precautions. Telemetry.  Recheck magnesium  level in the morning.  Check serum phosphorus level. Repeat CMP in the morning. Check INR. UDS.  Daily folic acid , multivitamin, and thiamine  supplementation, first dose of thiamine  supplementation to occur now.                   #) Anemia of chronic disease: Documented history of such, a/w with baseline hgb range 11-13, with presenting hgb consistent with this range, in the absence of any overt evidence of active bleed.     Plan: Repeat CBC in the morning.  Check PTT, INR.                    #) Chronic transaminitis: Documented history of such, with mild chronic elevation in alkaline phosphatase, AST, and ALT noted, with presenting corresponding values appearing consistent with mild baseline elevation, including most recent prior liver enzymes checked on 01/26/2024, as further quantified above, with suspicion for contribution from his history of chronic alcohol abuse.  There is mild interval elevation in his total bilirubin but in the absence of any overt new abdominal discomfort.  Source of patient's sepsis appears to be a cellulitis involving his left foot.  Will continue to trend liver enzymes, with consideration for potential abdominal imaging if further interval increase in liver enzymes, including total bilirubin.  Will also attempt to further qualify by checking direct bilirubin level.  Urinary drug screen is currently pending.  Of note, total CPK checked this evening was found to be nonelevated, as further quantified above.  Plan: Repeat CMP in the morning.  Check direct bilirubin level.  Follow-up result urinary drug screen.  Check PTT,  INR.      DVT prophylaxis: SCD's   Code Status: Full code Family Communication: none Disposition Plan: Per Rounding Team Consults called: none;  Admission status: inpatient;     I SPENT GREATER THAN 75  MINUTES IN CLINICAL CARE TIME/MEDICAL DECISION-MAKING IN COMPLETING THIS ADMISSION.     Gattis Kass Numan Zylstra DO [facility] From 7PM - 7AM   01/31/2024, 9:52 PM
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
## 2024-01-31 NOTE — Progress Notes (Signed)
 Pharmacy Antibiotic Note  Mark Hammond is a 33 y.o. male admitted on 01/31/2024 with cellulitis.  Pharmacy has been consulted for vancomycin dosing.  1st dose given in the ED  Plan: Vancomycin 1gm IV q12h (AUC 528.8, Scr used 0.8, TBW) Follow renal function,cultures and clinical course     Temp (24hrs), Avg:99.5 F (37.5 C), Min:99 F (37.2 C), Max:99.9 F (37.7 C)  Recent Labs  Lab 01/26/24 2341 01/31/24 1934 01/31/24 1937 01/31/24 2116  WBC 11.2* 13.5*  --   --   CREATININE 0.48* 0.57*  --   --   LATICACIDVEN  --   --  2.8* 1.1    Estimated Creatinine Clearance: 106.3 mL/min (A) (by C-G formula based on SCr of 0.57 mg/dL (L)).    Allergies  Allergen Reactions   Codeine Other (See Comments)    Patient states parents told him he was allergic at a young age.   Fish Allergy Other (See Comments)    Patient does not wish to eat ANY fish- reaction unclear   Shellfish Allergy Other (See Comments)    Patient does not wish to eat ANY shellfish- reaction unclear    Antimicrobials this admission: 4/30 cefepime x 1 4/30 Vanc >> 5/1 CTX >>  Dose adjustments this admission:   Microbiology results: 4/30 BCx:   Thank you for allowing pharmacy to be a part of this patient's care.  Beau Bound RPh 01/31/2024, 10:18 PM

## 2024-01-31 NOTE — ED Notes (Signed)
 Pt removing self from monitoring devices. Informed pt to please keep them on to monitor vitals. Pt continues to do this despite educating pt.

## 2024-01-31 NOTE — ED Triage Notes (Signed)
 Pt came in for generalized pain (leg, back, testicle, arms), leg swelling, and sun burn for the past couple of days.

## 2024-01-31 NOTE — ED Notes (Signed)
 Pt has a lactic critical of 2.79. Provider and RN was made aware.

## 2024-02-01 ENCOUNTER — Inpatient Hospital Stay (HOSPITAL_COMMUNITY): Payer: MEDICAID

## 2024-02-01 DIAGNOSIS — D638 Anemia in other chronic diseases classified elsewhere: Secondary | ICD-10-CM | POA: Diagnosis not present

## 2024-02-01 DIAGNOSIS — F319 Bipolar disorder, unspecified: Secondary | ICD-10-CM | POA: Diagnosis not present

## 2024-02-01 DIAGNOSIS — L03116 Cellulitis of left lower limb: Secondary | ICD-10-CM | POA: Diagnosis not present

## 2024-02-01 DIAGNOSIS — A419 Sepsis, unspecified organism: Secondary | ICD-10-CM | POA: Diagnosis not present

## 2024-02-01 DIAGNOSIS — F10931 Alcohol use, unspecified with withdrawal delirium: Secondary | ICD-10-CM

## 2024-02-01 LAB — COMPREHENSIVE METABOLIC PANEL WITH GFR
ALT: 45 U/L — ABNORMAL HIGH (ref 0–44)
AST: 77 U/L — ABNORMAL HIGH (ref 15–41)
Albumin: 2.2 g/dL — ABNORMAL LOW (ref 3.5–5.0)
Alkaline Phosphatase: 101 U/L (ref 38–126)
Anion gap: 10 (ref 5–15)
BUN: 6 mg/dL (ref 6–20)
CO2: 23 mmol/L (ref 22–32)
Calcium: 7.4 mg/dL — ABNORMAL LOW (ref 8.9–10.3)
Chloride: 96 mmol/L — ABNORMAL LOW (ref 98–111)
Creatinine, Ser: 0.47 mg/dL — ABNORMAL LOW (ref 0.61–1.24)
GFR, Estimated: >60 mL/min (ref >60)
Glucose, Bld: 237 mg/dL — ABNORMAL HIGH (ref 70–99)
Potassium: 3.3 mmol/L — ABNORMAL LOW (ref 3.5–5.1)
Sodium: 129 mmol/L — ABNORMAL LOW (ref 135–145)
Total Bilirubin: 1.2 mg/dL (ref 0.0–1.2)
Total Protein: 6.2 g/dL — ABNORMAL LOW (ref 6.5–8.1)

## 2024-02-01 LAB — CBC WITH DIFFERENTIAL/PLATELET
Abs Immature Granulocytes: 0.03 10*3/uL (ref 0.00–0.07)
Basophils Absolute: 0 10*3/uL (ref 0.0–0.1)
Basophils Relative: 0 %
Eosinophils Absolute: 0 10*3/uL (ref 0.0–0.5)
Eosinophils Relative: 0 %
HCT: 30.4 % — ABNORMAL LOW (ref 39.0–52.0)
Hemoglobin: 9.8 g/dL — ABNORMAL LOW (ref 13.0–17.0)
Immature Granulocytes: 0 %
Lymphocytes Relative: 9 %
Lymphs Abs: 0.8 10*3/uL (ref 0.7–4.0)
MCH: 29.4 pg (ref 26.0–34.0)
MCHC: 32.2 g/dL (ref 30.0–36.0)
MCV: 91.3 fL (ref 80.0–100.0)
Monocytes Absolute: 0.7 10*3/uL (ref 0.1–1.0)
Monocytes Relative: 9 %
Neutro Abs: 6.6 10*3/uL (ref 1.7–7.7)
Neutrophils Relative %: 82 %
Platelets: 304 10*3/uL (ref 150–400)
RBC: 3.33 MIL/uL — ABNORMAL LOW (ref 4.22–5.81)
RDW: 15 % (ref 11.5–15.5)
WBC: 8.1 10*3/uL (ref 4.0–10.5)
nRBC: 0 % (ref 0.0–0.2)

## 2024-02-01 LAB — PROTIME-INR
INR: 1.4 — ABNORMAL HIGH (ref 0.8–1.2)
Prothrombin Time: 17.3 s — ABNORMAL HIGH (ref 11.4–15.2)

## 2024-02-01 LAB — APTT: aPTT: 41 s — ABNORMAL HIGH (ref 24–36)

## 2024-02-01 LAB — PHOSPHORUS: Phosphorus: 2.4 mg/dL — ABNORMAL LOW (ref 2.5–4.6)

## 2024-02-01 LAB — MAGNESIUM: Magnesium: 1.6 mg/dL — ABNORMAL LOW (ref 1.7–2.4)

## 2024-02-01 LAB — MRSA NEXT GEN BY PCR, NASAL: MRSA by PCR Next Gen: NOT DETECTED

## 2024-02-01 LAB — BILIRUBIN, DIRECT: Bilirubin, Direct: 0.5 mg/dL — ABNORMAL HIGH (ref 0.0–0.2)

## 2024-02-01 MED ORDER — MAGNESIUM OXIDE -MG SUPPLEMENT 400 (240 MG) MG PO TABS
400.0000 mg | ORAL_TABLET | Freq: Two times a day (BID) | ORAL | Status: AC
  Administered 2024-02-01 – 2024-02-02 (×4): 400 mg via ORAL
  Filled 2024-02-01 (×4): qty 1

## 2024-02-01 MED ORDER — POTASSIUM CHLORIDE IN NACL 20-0.9 MEQ/L-% IV SOLN
INTRAVENOUS | Status: AC
  Filled 2024-02-01 (×2): qty 1000

## 2024-02-01 MED ORDER — LACTATED RINGERS IV BOLUS
500.0000 mL | Freq: Once | INTRAVENOUS | Status: AC
  Administered 2024-02-01: 500 mL via INTRAVENOUS

## 2024-02-01 MED ORDER — POTASSIUM CHLORIDE 20 MEQ PO PACK
40.0000 meq | PACK | Freq: Two times a day (BID) | ORAL | Status: AC
  Administered 2024-02-01 – 2024-02-02 (×3): 40 meq via ORAL
  Filled 2024-02-01 (×4): qty 2

## 2024-02-01 MED ORDER — CHLORHEXIDINE GLUCONATE CLOTH 2 % EX PADS
6.0000 | MEDICATED_PAD | Freq: Every day | CUTANEOUS | Status: DC
  Administered 2024-02-01 – 2024-02-03 (×3): 6 via TOPICAL

## 2024-02-01 MED ORDER — POTASSIUM PHOSPHATES 15 MMOLE/5ML IV SOLN
30.0000 mmol | Freq: Once | INTRAVENOUS | Status: AC
  Administered 2024-02-01: 30 mmol via INTRAVENOUS
  Filled 2024-02-01: qty 10

## 2024-02-01 MED ORDER — METOPROLOL TARTRATE 5 MG/5ML IV SOLN
5.0000 mg | Freq: Once | INTRAVENOUS | Status: AC
  Administered 2024-02-01: 5 mg via INTRAVENOUS
  Filled 2024-02-01: qty 5

## 2024-02-01 NOTE — Progress Notes (Signed)
 Patient transferred from ED, care assumed. Patient lethargic, speech slurred and refusing care, labs and tele monitor. Ativan  administered per CIWA protocol. LLE image added to chart for wound. Patient to be transferred to SDU, report called.

## 2024-02-01 NOTE — Plan of Care (Signed)

## 2024-02-01 NOTE — Progress Notes (Addendum)
 Progress Note   Patient: Mark Hammond ZOX:[id number] DOB: [birth year] DOA: 01/31/2024     1 DOS: the patient was seen and examined on 02/01/2024   Brief hospital course: Mark Hammond is a 33 y.o. male with medical history significant for bipolar disorder, polysubstance abuse, chronic alcohol abuse, chronic left thigh pain, chronic left thigh numbness, chronic transaminitis, who is admitted to [facility] on 01/31/2024 with suspected severe sepsis due to cellulitis of the left foot after presenting from home to [facility] ED complaining of left foot pain.   Patient is admitted for evaluation of severe sepsis due to cellulitis, electrolyte abnormalities, alcohol withdrawal.   Assessment and Plan: Severe sepsis-patient has fever, tachycardia, tachypnea, lactic acid 2.8 initially, abnormal liver function Left leg cellulitis-he has left leg, foot erythema, swelling and tenderness Right lower lobe pneumonia-chest x-ray finding. Patient will be continued on vancomycin , Rocephin . Continue gentle IV fluids with electrolyte repletion. Continue to monitor vitals closely. Unable to obtain MRI left foot due to his poor mental status. Plan to get it tomorrow. Got Abd xray, Skull xray for MRI clearance as we are unable to contact family. Given his unstable vitals, need for close CIWA protocol I will transfer him to stepdown unit.  Alcohol withdrawal syndrome- Patient is placed on CIWA protocol with Ativan . Continue to monitor vitals closely. Continue thiamine , folate and multivitamin.  Electrolyte abnormalities Hypokalemia-continue oral repletion. Fluids with IV kcl. Hypomagnesemia-mag sulfate IV, mag oxide oral daily ordered. Hypophosphatemia-IV potassium phosphate once ordered. Hyponatremia-continue to monitor sodium trend.  Gentle IV hydration. Monitor daily electrolytes.  Chronic alcoholic hepatitis- Abnormal LFTs.  AST 77, ALT 45, trending down. Bili 0.5 INR 1.4, PT 17.3 Continue to trend  LFTs.  Polysubstance abuse- Urine drug screen positive for amphetamines, opiates, cocaine , cannabis. He will need substance abuse counseling once more alert and awake.  Anemia of chronic disease- Hemoglobin stable.  Baseline hemoglobin around 10-12. Iron profile 11/25/2023 low.  Continue iron supplements. No active bleeding. Continue to monitor hemoglobin daily.    Patient home meds will be resumed once he is more alert, awake. Out of bed to chair. Incentive spirometry. Nursing supportive care. Fall, aspiration precautions. Diet:  Diet Orders (From admission, onward)     Start     Ordered   01/31/24 2147  Diet regular Room service appropriate? Yes; Fluid consistency: Thin  Diet effective now       Question Answer Comment  Room service appropriate? Yes   Fluid consistency: Thin      01/31/24 2147           DVT prophylaxis: SCDs Start: 01/31/24 2147  Level of care: Progressive   Code Status: Full Code  Subjective: Patient is seen and examined today morning. He is restless, agitated. Does not follow commands. RN at bedside states, he ate few bites. Currently under CIWA protocol.   Physical Exam: Vitals:   02/01/24 1307 02/01/24 1400 02/01/24 1533 02/01/24 1614  BP: 135/79 128/81 138/89 138/89  Pulse: (!) 126 (!) 132 (!) 128 (!) 125  Resp: 18  19   Temp:   (!) 101.1 F (38.4 C)   TempSrc:   Oral   SpO2: 100%  97%     General - Young ill looking male, restless, agitated HEENT - PERRLA, EOMI, atraumatic head, non tender sinuses. Lung - Clear, basal rales, diffuse rhonchi, no wheezes. Heart - S1, S2 heard, no murmurs, rubs, left> right leg edema, redness noted. Abdomen - Soft, non tender, bowel sounds good Neuro -  lethargic, restless at times, unable to do full neuro exam. Skin - Warm and dry.  Data Reviewed:      Latest Ref Rng & Units 02/01/2024    4:42 AM 01/31/2024    7:34 PM 01/26/2024   11:41 PM  CBC  WBC 4.0 - 10.5 K/uL 8.1  13.5  11.2   Hemoglobin  13.0 - 17.0 g/dL 9.8  16.1  09.6   Hematocrit 39.0 - 52.0 % 30.4  32.9  33.5   Platelets 150 - 400 K/uL 304  497  367       Latest Ref Rng & Units 02/01/2024    4:42 AM 01/31/2024    7:34 PM 01/26/2024   11:41 PM  BMP  Glucose 70 - 99 mg/dL 045  97  409   BUN 6 - 20 mg/dL 6  8  5    Creatinine 0.61 - 1.24 mg/dL 8.11  9.14  7.82   Sodium 135 - 145 mmol/L 129  133  132   Potassium 3.5 - 5.1 mmol/L 3.3  3.3  3.6   Chloride 98 - 111 mmol/L 96  93  95   CO2 22 - 32 mmol/L 23  23  25    Calcium 8.9 - 10.3 mg/dL 7.4  9.0  8.1    DG Skull 1-3 Views Result Date: 02/01/2024 CLINICAL DATA:  Encounter for imaging to screen for metal prior to MRI EXAM: SKULL - 1-3 VIEW COMPARISON:  None Available. FINDINGS: There is no evidence of skull fracture or other focal bone lesions. No definite metallic foreign body is noted. IMPRESSION: Negative. Electronically Signed   By: Rosalene Colon M.D.   On: 02/01/2024 12:33   DG Abd 1 View Result Date: 02/01/2024 CLINICAL DATA:  Pre MRI screen EXAM: ABDOMEN - 1 VIEW COMPARISON:  None Available. FINDINGS: Decubitus views of the abdomen and pelvis to the pubic symphysis demonstrate no radiodense foreign bodies. IMPRESSION: No radiodense foreign body identified in the imaged areas. Electronically Signed   By: Deboraha Fallow M.D.   On: 02/01/2024 12:28   DG Chest Port 1 View Result Date: 02/01/2024 EXAM: 1 VIEW XRAY OF THE CHEST 02/01/2024 12:55:50 AM COMPARISON: 05/28/2023 CLINICAL HISTORY: [id number] Sepsis (HCC) [id number]. Per chart: Reason for exam: acute left foot pain and cellulitis ; Best obtainable due to patient condition, pt unable to roll on back as well as unable to move legs. Pt was not awake during exam after multiple attempts to wake patient up. Acute left foot pain and cellulitis. FINDINGS: LUNGS AND PLEURA: Mild patchy right lower lobe opacity suspicious for pneumonia. No pulmonary edema. No pleural effusion. No pneumothorax. HEART AND MEDIASTINUM: No acute  abnormality of the cardiac and mediastinal silhouettes. BONES AND SOFT TISSUES: No acute osseous abnormality. IMPRESSION: 1. Mild patchy right lower lobe opacity, suspicious for pneumonia. Electronically signed by: Zadie Herter MD 02/01/2024 01:02 AM EDT RP Workstation: [id number]   DG Foot 2 Views Left Result Date: 02/01/2024 EXAM: 2 VIEW(S) XRAY OF THE LEFT FOOT 02/01/2024 12:53:06 AM COMPARISON: None available. CLINICAL HISTORY: Acute left foot pain and cellulitis. Best obtainable due to patient condition, pt unable to roll on back as well as move legs. Pt was not awake during exam after multiple attempts to wake patient up. FINDINGS: BONES AND JOINTS: No acute fracture or focal osseous lesion. No joint dislocation. SOFT TISSUES: Mild dorsal soft tissue swelling. IMPRESSION: 1. No fracture or dislocation. Electronically signed by: Zadie Herter MD 02/01/2024 01:01 AM EDT RP Workstation: [id number]  Family Communication: Unable to connect to uncle listed as emergency contact by myself and radiology staff.  Disposition: Status is: Inpatient Remains inpatient appropriate because: Left leg cellulitis, IV antibiotics, electrolyte abnormalities, CIWA protocol  Planned Discharge Destination: Rehab    MDM level 3- Patient is sick with severe sepsis, alcohol withdrawal, electrolyte abnormalities. Patient need close hemodynamic, neurologic, telemetry, electrolyte monitoring. He is at high risk for sudden clinical deterioration, need SDU monitoring.  Author: Aisha Hove, MD 02/01/2024 4:50 PM Secure chat 7am to 7pm For on call review [email or web address].

## 2024-02-02 ENCOUNTER — Encounter (HOSPITAL_COMMUNITY): Payer: Self-pay

## 2024-02-02 DIAGNOSIS — L03116 Cellulitis of left lower limb: Secondary | ICD-10-CM | POA: Diagnosis not present

## 2024-02-02 LAB — CBC
HCT: 34 % — ABNORMAL LOW (ref 39.0–52.0)
Hemoglobin: 10.8 g/dL — ABNORMAL LOW (ref 13.0–17.0)
MCH: 29.2 pg (ref 26.0–34.0)
MCHC: 31.8 g/dL (ref 30.0–36.0)
MCV: 91.9 fL (ref 80.0–100.0)
Platelets: 266 10*3/uL (ref 150–400)
RBC: 3.7 MIL/uL — ABNORMAL LOW (ref 4.22–5.81)
RDW: 14.9 % (ref 11.5–15.5)
WBC: 8.4 10*3/uL (ref 4.0–10.5)
nRBC: 0 % (ref 0.0–0.2)

## 2024-02-02 LAB — PHOSPHORUS: Phosphorus: 2.8 mg/dL (ref 2.5–4.6)

## 2024-02-02 LAB — COMPREHENSIVE METABOLIC PANEL WITH GFR
ALT: 47 U/L — ABNORMAL HIGH (ref 0–44)
AST: 87 U/L — ABNORMAL HIGH (ref 15–41)
Albumin: 2.4 g/dL — ABNORMAL LOW (ref 3.5–5.0)
Alkaline Phosphatase: 107 U/L (ref 38–126)
Anion gap: 9 (ref 5–15)
BUN: <5 mg/dL — ABNORMAL LOW (ref 6–20)
CO2: 27 mmol/L (ref 22–32)
Calcium: 8 mg/dL — ABNORMAL LOW (ref 8.9–10.3)
Chloride: 97 mmol/L — ABNORMAL LOW (ref 98–111)
Creatinine, Ser: 0.42 mg/dL — ABNORMAL LOW (ref 0.61–1.24)
GFR, Estimated: >60 mL/min (ref >60)
Glucose, Bld: 103 mg/dL — ABNORMAL HIGH (ref 70–99)
Potassium: 3.4 mmol/L — ABNORMAL LOW (ref 3.5–5.1)
Sodium: 133 mmol/L — ABNORMAL LOW (ref 135–145)
Total Bilirubin: 1.2 mg/dL (ref 0.0–1.2)
Total Protein: 6.5 g/dL (ref 6.5–8.1)

## 2024-02-02 LAB — MAGNESIUM: Magnesium: 1.5 mg/dL — ABNORMAL LOW (ref 1.7–2.4)

## 2024-02-02 MED ORDER — CHLORDIAZEPOXIDE HCL 25 MG PO CAPS
25.0000 mg | ORAL_CAPSULE | Freq: Four times a day (QID) | ORAL | Status: AC
  Administered 2024-02-02 – 2024-02-03 (×4): 25 mg via ORAL
  Filled 2024-02-02 (×4): qty 1

## 2024-02-02 MED ORDER — PNEUMOCOCCAL 20-VAL CONJ VACC 0.5 ML IM SUSY: 0.5000 mL | PREFILLED_SYRINGE | INTRAMUSCULAR | Status: DC | PRN

## 2024-02-02 NOTE — Progress Notes (Signed)
 Progress Note   Patient: Mark Hammond GNF:[id number] DOB: 1990/11/02 DOA: 01/31/2024     2 DOS: the patient was seen and examined on 02/02/2024   Brief hospital course: Patient is a 33 year old male with past medical history significant for bipolar disorder, polysubstance abuse, chronic alcohol abuse, chronic left thigh pain, chronic left thigh numbness, and chronic transaminitis.  Patient was admitted to [facility] on 01/31/2024 with suspected severe sepsis due to cellulitis of the left foot after presenting from home to [facility] ED complaining of left foot pain.   Patient is admitted for evaluation of severe sepsis due to cellulitis, electrolyte abnormalities, alcohol withdrawal.   02/02/2024: Patient seen.  Left lower extremity cellulitis seems to be improving.  Patient remains tachycardic, with heart rate in the 120s.  Will add Librium  25 Mg p.o. 4 times daily x 4 doses only.  Continue CIWA Protocol.  Assessment and Plan: Severe sepsis: -Patient has left lower extremity cellulitis. - Cellulitis is improving.   -Continue vancomycin  and Rocephin .   -Continue to monitor vitals closely. Pursue MRI of left foot when possible.    Alcohol withdrawal syndrome: Continue CIWA protocol. Librium  25 Mg p.o. 4 times daily x 4 doses and stop.   Continue to monitor vitals closely. Continue thiamine , folate and multivitamin.  Electrolyte abnormalities (Hypokalemia and hypomagnesemia): -Potassium of 3.4 today and magnesium  of 1.5. - Continue KCl supplements. - Will administer IV magnesium  4 g x 1 dose. - Will continue to monitor electrolytes.    Chronic alcoholic hepatitis: Abnormal LFTs.  AST 87and   ALT 47. Continue to monitor LFTs.  Polysubstance abuse: Urine drug screen positive for amphetamines, opiates, cocaine , cannabis.  Anemia of chronic disease: Hemoglobin stable.  Baseline hemoglobin around 10-12. Iron profile 11/25/2023 low.  Continue iron supplements. No active  bleeding. Continue to monitor hemoglobin for sleep.     Diet:  Diet Orders (From admission, onward)     Start     Ordered   01/31/24 2147  Diet regular Room service appropriate? Yes; Fluid consistency: Thin  Diet effective now       Question Answer Comment  Room service appropriate? Yes   Fluid consistency: Thin      01/31/24 2147           DVT prophylaxis: SCDs Start: 01/31/24 2147  Level of care: Stepdown   Code Status: Full Code  Subjective: Patient is seen and examined today morning. He is restless, agitated. Does not follow commands. RN at bedside states, he ate few bites. Currently under CIWA protocol.   Physical Exam: Vitals:   02/02/24 1000 02/02/24 1100 02/02/24 1109 02/02/24 1137  BP: 124/78 130/87 130/87 130/87  Pulse: (!) 133  (!) 122 (!) 119  Resp:      Temp:      TempSrc:      SpO2:        General -not in any distress.  No fully awake.  Easily arousable.  HEENT -patient is pale.  No jaundice.   Lung - Clear to auscultation. Heart - S1, S2 heard, tachycardic.   Abdomen - Soft, non tender. Neuro -remains somnolent/sleepy. Extremities: Left lower extremity cellulitis is improving.  Data Reviewed:      Latest Ref Rng & Units 02/02/2024    3:13 AM 02/01/2024    4:42 AM 01/31/2024    7:34 PM  CBC  WBC 4.0 - 10.5 K/uL 8.4  8.1  13.5   Hemoglobin 13.0 - 17.0 g/dL 84.6  9.8  11.0  Hematocrit 39.0 - 52.0 % 34.0  30.4  32.9   Platelets 150 - 400 K/uL 266  304  497       Latest Ref Rng & Units 02/02/2024    3:13 AM 02/01/2024    4:42 AM 01/31/2024    7:34 PM  BMP  Glucose 70 - 99 mg/dL 604  540  97   BUN 6 - 20 mg/dL 5  6  8    Creatinine 0.61 - 1.24 mg/dL 9.81  1.91  4.78   Sodium 135 - 145 mmol/L 133  129  133   Potassium 3.5 - 5.1 mmol/L 3.4  3.3  3.3   Chloride 98 - 111 mmol/L 97  96  93   CO2 22 - 32 mmol/L 27  23  23    Calcium 8.9 - 10.3 mg/dL 8.0  7.4  9.0    DG Skull 1-3 Views Result Date: 02/01/2024 CLINICAL DATA:  Encounter for imaging  to screen for metal prior to MRI EXAM: SKULL - 1-3 VIEW COMPARISON:  None Available. FINDINGS: There is no evidence of skull fracture or other focal bone lesions. No definite metallic foreign body is noted. IMPRESSION: Negative. Electronically Signed   By: Rosalene Colon M.D.   On: 02/01/2024 12:33   DG Abd 1 View Result Date: 02/01/2024 CLINICAL DATA:  Pre MRI screen EXAM: ABDOMEN - 1 VIEW COMPARISON:  None Available. FINDINGS: Decubitus views of the abdomen and pelvis to the pubic symphysis demonstrate no radiodense foreign bodies. IMPRESSION: No radiodense foreign body identified in the imaged areas. Electronically Signed   By: Deboraha Fallow M.D.   On: 02/01/2024 12:28   DG Chest Port 1 View Result Date: 02/01/2024 EXAM: 1 VIEW XRAY OF THE CHEST 02/01/2024 12:55:50 AM COMPARISON: 05/28/2023 CLINICAL HISTORY: [id number] Sepsis (HCC) [id number]. Per chart: Reason for exam: acute left foot pain and cellulitis ; Best obtainable due to patient condition, pt unable to roll on back as well as unable to move legs. Pt was not awake during exam after multiple attempts to wake patient up. Acute left foot pain and cellulitis. FINDINGS: LUNGS AND PLEURA: Mild patchy right lower lobe opacity suspicious for pneumonia. No pulmonary edema. No pleural effusion. No pneumothorax. HEART AND MEDIASTINUM: No acute abnormality of the cardiac and mediastinal silhouettes. BONES AND SOFT TISSUES: No acute osseous abnormality. IMPRESSION: 1. Mild patchy right lower lobe opacity, suspicious for pneumonia. Electronically signed by: Zadie Herter MD 02/01/2024 01:02 AM EDT RP Workstation: [id number]   DG Foot 2 Views Left Result Date: 02/01/2024 EXAM: 2 VIEW(S) XRAY OF THE LEFT FOOT 02/01/2024 12:53:06 AM COMPARISON: None available. CLINICAL HISTORY: Acute left foot pain and cellulitis. Best obtainable due to patient condition, pt unable to roll on back as well as move legs. Pt was not awake during exam after multiple attempts to wake  patient up. FINDINGS: BONES AND JOINTS: No acute fracture or focal osseous lesion. No joint dislocation. SOFT TISSUES: Mild dorsal soft tissue swelling. IMPRESSION: 1. No fracture or dislocation. Electronically signed by: Zadie Herter MD 02/01/2024 01:01 AM EDT RP Workstation: [id number]    Family Communication: Unable to connect to uncle listed as emergency contact by myself and radiology staff.  Disposition: Status is: Inpatient Remains inpatient appropriate because: Left leg cellulitis, IV antibiotics, electrolyte abnormalities, CIWA protocol  Planned Discharge Destination: Rehab    MDM level 3- Patient is sick with severe sepsis, alcohol withdrawal, electrolyte abnormalities. Patient need close hemodynamic, neurologic, telemetry, electrolyte monitoring. He is at high risk  for sudden clinical deterioration, need SDU monitoring.  Time spent: 55 minutes.  Author: Doroteo Gasmen, MD 02/02/2024 11:49 AM Secure chat 7am to 7pm For on call review [email or web address].

## 2024-02-02 NOTE — Plan of Care (Signed)
  Problem: Health Behavior/Discharge Planning: Goal: Ability to manage health-related needs will improve Outcome: Progressing   Problem: Clinical Measurements: Goal: Ability to maintain clinical measurements within normal limits will improve Outcome: Progressing Goal: Will remain free from infection Outcome: Progressing Goal: Diagnostic test results will improve Outcome: Progressing Goal: Respiratory complications will improve Outcome: Progressing Goal: Cardiovascular complication will be avoided Outcome: Progressing   Problem: Nutrition: Goal: Adequate nutrition will be maintained Outcome: Progressing   

## 2024-02-02 NOTE — Progress Notes (Signed)
   02/02/24 1106  TOC Brief Assessment  Insurance and Status Reviewed  Patient has primary care physician Yes  Home environment has been reviewed home  Prior level of function: independent  Prior/Current Home Services No current home services  Social Drivers of Health Review SDOH reviewed no interventions necessary  Readmission risk has been reviewed Yes  Transition of care needs transition of care needs identified, TOC will continue to follow

## 2024-02-03 ENCOUNTER — Inpatient Hospital Stay (HOSPITAL_COMMUNITY): Payer: MEDICAID

## 2024-02-03 DIAGNOSIS — L03116 Cellulitis of left lower limb: Secondary | ICD-10-CM | POA: Diagnosis not present

## 2024-02-03 LAB — CBC WITH DIFFERENTIAL/PLATELET
Abs Immature Granulocytes: 0.03 10*3/uL (ref 0.00–0.07)
Basophils Absolute: 0.1 10*3/uL (ref 0.0–0.1)
Basophils Relative: 1 %
Eosinophils Absolute: 0.1 10*3/uL (ref 0.0–0.5)
Eosinophils Relative: 1 %
HCT: 30.9 % — ABNORMAL LOW (ref 39.0–52.0)
Hemoglobin: 9.3 g/dL — ABNORMAL LOW (ref 13.0–17.0)
Immature Granulocytes: 0 %
Lymphocytes Relative: 25 %
Lymphs Abs: 1.7 10*3/uL (ref 0.7–4.0)
MCH: 28.8 pg (ref 26.0–34.0)
MCHC: 30.1 g/dL (ref 30.0–36.0)
MCV: 95.7 fL (ref 80.0–100.0)
Monocytes Absolute: 0.7 10*3/uL (ref 0.1–1.0)
Monocytes Relative: 11 %
Neutro Abs: 4.2 10*3/uL (ref 1.7–7.7)
Neutrophils Relative %: 62 %
Platelets: 232 10*3/uL (ref 150–400)
RBC: 3.23 MIL/uL — ABNORMAL LOW (ref 4.22–5.81)
RDW: 14.8 % (ref 11.5–15.5)
WBC: 6.7 10*3/uL (ref 4.0–10.5)
nRBC: 0 % (ref 0.0–0.2)

## 2024-02-03 LAB — COMPREHENSIVE METABOLIC PANEL WITH GFR
ALT: 40 U/L (ref 0–44)
AST: 68 U/L — ABNORMAL HIGH (ref 15–41)
Albumin: 2.1 g/dL — ABNORMAL LOW (ref 3.5–5.0)
Alkaline Phosphatase: 86 U/L (ref 38–126)
Anion gap: 11 (ref 5–15)
BUN: 6 mg/dL (ref 6–20)
CO2: 24 mmol/L (ref 22–32)
Calcium: 8.1 mg/dL — ABNORMAL LOW (ref 8.9–10.3)
Chloride: 100 mmol/L (ref 98–111)
Creatinine, Ser: 0.56 mg/dL — ABNORMAL LOW (ref 0.61–1.24)
GFR, Estimated: >60 mL/min (ref >60)
Glucose, Bld: 99 mg/dL (ref 70–99)
Potassium: 3.7 mmol/L (ref 3.5–5.1)
Sodium: 135 mmol/L (ref 135–145)
Total Bilirubin: 0.8 mg/dL (ref 0.0–1.2)
Total Protein: 5.7 g/dL — ABNORMAL LOW (ref 6.5–8.1)

## 2024-02-03 LAB — MAGNESIUM: Magnesium: 1.7 mg/dL (ref 1.7–2.4)

## 2024-02-03 LAB — PHOSPHORUS: Phosphorus: 3.9 mg/dL (ref 2.5–4.6)

## 2024-02-03 MED ORDER — CARMEX CLASSIC LIP BALM EX OINT
TOPICAL_OINTMENT | CUTANEOUS | Status: DC | PRN
  Filled 2024-02-03: qty 10

## 2024-02-03 MED ORDER — CHLORDIAZEPOXIDE HCL 25 MG PO CAPS
25.0000 mg | ORAL_CAPSULE | Freq: Three times a day (TID) | ORAL | Status: AC
  Administered 2024-02-03 (×3): 25 mg via ORAL
  Filled 2024-02-03 (×3): qty 1

## 2024-02-03 MED ORDER — GADOBUTROL 1 MMOL/ML IV SOLN
5.0000 mL | Freq: Once | INTRAVENOUS | Status: AC | PRN
  Administered 2024-02-03: 5 mL via INTRAVENOUS

## 2024-02-03 NOTE — Progress Notes (Signed)
 PT Cancellation Note  Patient Details Name: Mark Hammond MRN: [id number] DOB: July 25, 1991   Cancelled Treatment:    Reason Eval/Treat Not Completed: Patient not medically ready per RN. Will check back 5/4. Abelina Hoes PT Acute Rehabilitation Services Office [phone]     Dareen Ebbing 02/03/2024, 9:53 AM

## 2024-02-03 NOTE — Progress Notes (Signed)
 PROGRESS NOTE  Mark Hammond  DOB: 02/19/1991  PCP: Patient, No Pcp Per [facility]:[id number]  DOA: 01/31/2024  LOS: 3 days  Hospital Day: 4  Brief narrative: Mark Hammond is a 33 y.o. male with PMH significant for polysubstance abuse including alcohol, tobacco, cocaine , heroin, MDMA, marijuana, bipolar disorder, schizoaffective schizophrenia 4/30 patient presented to ED with complaint of left foot pain, swelling, warmth.. Workup in the ED showed left foot cellulitis with tachycardia, leukocytosis, lactic acidosis. Also had electrolyte abnormalities and alcohol withdrawal symptoms. Admitted to [facility]  Subjective: Patient was seen and examined this morning. Young African-American male.  Drowsy under the influence of sedative. Has gross swelling of left lower extremity compared to right.  I see cellulitis in the left knee and not in the left leg or foot. Chart reviewed No open had a fever of 102.8 last night.  Otherwise no fever in last 24-hour duration. Tachycardic to 100s this morning, blood pressure in the low normal range, breathing on room air Most recent labs from this morning with WBC count normal, hemoglobin 9.3, platelet normal, BUN/creatinine normal, AST ALT improving, alk phos and bilirubin normal  Assessment and plan: Severe sepsis - POA Left lower extremity cellulitis On exam this morning has gross swelling of left lower extremity compared to right.  I see cellulitis in the left knee and not in the left leg or foot. He had fever of 102.8 last night. I see an MRI left femur ordered at admission. ??  Not done Currently on IV Rocephin  and IV vancomycin  WBC count, lactic acid level normalized Recent Labs  Lab 01/31/24 1934 01/31/24 1937 01/31/24 2116 02/01/24 0442 02/02/24 0313 02/03/24 0324  WBC 13.5*  --   --  8.1 8.4 6.7  LATICACIDVEN  --  2.8* 1.1  --   --   --    Alcohol withdrawal symptoms Chronic alcoholism Unable to quantify to me how much he drinks  currently on CIWA  protocol Received Librium  25 mg every 6 hours yesterday.  Tapered to 25 mg every 8 hours today.  Continue as needed Ativan . Continue thiamine , folate and multivitamin.  Hypokalemia/hypomagnesemia/hypophosphatemia Levels improved with replacement.  Trend as below Recent Labs  Lab 01/31/24 1934 01/31/24 2154 02/01/24 0442 02/02/24 0313 02/03/24 0324  K 3.3*  --  3.3* 3.4* 3.7  MG  --  1.7 1.6* 1.5* 1.7  PHOS  --   --  2.4* 2.8 3.9   Chronic alcoholic hepatitis LFTs improving. Recent Labs  Lab 01/31/24 1934 02/01/24 0442 02/02/24 0313 02/03/24 0324  AST 116* 77* 87* 68*  ALT 61* 45* 47* 40  ALKPHOS 141* 101 107 86  BILITOT 1.4* 1.2 1.2 0.8  PROT 8.4* 6.2* 6.5 5.7*  ALBUMIN 3.1* 2.2* 2.4* 2.1*   Polysubstance abuse Urine drug screen positive for amphetamines, opiates, cocaine , cannabis.   Anemia of chronic disease Hemoglobin stable.  Baseline hemoglobin around 10-12. Continue iron supplements. No active bleeding. Recent Labs    11/25/23 0611 11/27/23 2104 01/26/24 2341 01/31/24 1934 02/01/24 0442 02/02/24 0313 02/03/24 0324  HGB  --    < > 11.1* 11.0* 9.8* 10.8* 9.3*  MCV  --    < > 89.3 88.2 91.3 91.9 95.7  VITAMINB12 549  --   --   --   --   --   --   FERRITIN 217  --   --   --   --   --   --   TIBC 280  --   --   --   --   --   --  IRON 21*  --   --   --   --   --   --    < > = values in this interval not displayed.   Mobility: PT/OT eval  Goals of care   Code Status: Full Code     DVT prophylaxis:  SCDs Start: 01/31/24 2147   Antimicrobials: IV Rocephin , IV vancomycin  Fluid: None Consultants: None Family Communication: None at bedside  Status: Inpatient Level of care:  Stepdown   Patient is from: Home Needs to continue in-hospital care: Needs further workup including MRI Anticipated d/c to: Home after improvement      Diet:  Diet Order             Diet regular Room service appropriate? Yes; Fluid consistency: Thin  Diet effective  now                   Scheduled Meds:  chlordiazePOXIDE   25 mg Oral TID   Chlorhexidine  Gluconate Cloth  6 each Topical Daily   ferrous sulfate   325 mg Oral Q breakfast   folic acid   1 mg Oral Daily   multivitamin with minerals  1 tablet Oral Daily   potassium chloride   40 mEq Oral BID   QUEtiapine   100 mg Oral QHS   thiamine  (VITAMIN B1) injection  100 mg Intravenous Daily    PRN meds: acetaminophen  **OR** acetaminophen , HYDROmorphone  (DILAUDID ) injection, LORazepam  **OR** LORazepam , melatonin, naLOXone  (NARCAN )  injection, nicotine , ondansetron  (ZOFRAN ) IV, pneumococcal 20-valent conjugate vaccine   Infusions:   cefTRIAXone  (ROCEPHIN )  IV Stopped (02/03/24 0455)   vancomycin  200 mL/hr at 02/03/24 0847    Antimicrobials: Anti-infectives (From admission, onward)    Start     Dose/Rate Route Frequency Ordered Stop   02/01/24 1000  vancomycin  (VANCOCIN ) IVPB 1000 mg/200 mL premix        1,000 mg 200 mL/hr over 60 Minutes Intravenous Every 12 hours 01/31/24 2208     02/01/24 0400  cefTRIAXone  (ROCEPHIN ) 1 g in sodium chloride  0.9 % 100 mL IVPB        1 g 200 mL/hr over 30 Minutes Intravenous Every 24 hours 01/31/24 2151     01/31/24 1945  vancomycin  (VANCOCIN ) IVPB 1000 mg/200 mL premix        1,000 mg 200 mL/hr over 60 Minutes Intravenous  Once 01/31/24 1940 01/31/24 2346   01/31/24 1945  ceFEPIme  (MAXIPIME ) 2 g in sodium chloride  0.9 % 100 mL IVPB        2 g 200 mL/hr over 30 Minutes Intravenous  Once 01/31/24 1940 01/31/24 2042       Objective: Vitals:   02/03/24 0800 02/03/24 0900  BP: (!) 108/54 (!) 145/86  Pulse: (!) 114 (!) 120  Resp: 20   Temp: 99.1 F (37.3 C)   SpO2: 97%     Intake/Output Summary (Last 24 hours) at 02/03/2024 0933 Last data filed at 02/03/2024 0847 Gross per 24 hour  Intake 1815.36 ml  Output 1550 ml  Net 265.36 ml   Filed Weights   02/03/24 0428  Weight: 55.8 kg   Weight change:  Body mass index is 21.79 kg/m.   Physical  Exam: General exam: Young African-American male.  Not in his chest Skin: No rashes, lesions or ulcers. HEENT: Atraumatic, normocephalic, no obvious bleeding Lungs: Clear to auscultation bilaterally,  CVS: Tachycardic, S1, S2, no murmur,   GI/Abd: Soft, nontender, nondistended, bowel sound present,   CNS: Drowsy from the effect of sedative.  Soft  voice Psychiatry: Sad affect Extremities: No pedal edema, no calf tenderness, left knee and distal thigh area red, swollen, demarcation noted but hard to know progression because of dark complexion skin.  Data Review: I have personally reviewed the laboratory data and studies available.  F/u labs ordered Unresulted Labs (From admission, onward)     Start     Ordered   02/03/24 0600  CBC with Differential/Platelet  Once,   R        02/03/24 0600   02/03/24 0500  Renal function panel  Daily,   R     Question:  Specimen collection method  Answer:  Lab=Lab collect   02/02/24 1207   02/03/24 0500  CBC with Differential/Platelet  Tomorrow morning,   R       Question:  Specimen collection method  Answer:  Lab=Lab collect   02/02/24 1207   02/02/24 0500  Comprehensive metabolic panel  Daily,   R     Question:  Specimen collection method  Answer:  Lab=Lab collect   02/01/24 2213   02/02/24 0500  CBC  Daily,   R     Question:  Specimen collection method  Answer:  Lab=Lab collect   02/01/24 2213   02/02/24 0500  Magnesium   Daily,   R     Question:  Specimen collection method  Answer:  Lab=Lab collect   02/01/24 2216   02/02/24 0500  Phosphorus  Daily,   R     Question:  Specimen collection method  Answer:  Lab=Lab collect   02/01/24 2216            Signed, Hoyt Macleod, MD [facility] 02/03/2024

## 2024-02-03 NOTE — Plan of Care (Signed)
  Problem: Clinical Measurements: Goal: Ability to maintain clinical measurements within normal limits will improve Outcome: Progressing Goal: Will remain free from infection Outcome: Progressing Goal: Diagnostic test results will improve Outcome: Progressing   Problem: Nutrition: Goal: Adequate nutrition will be maintained Outcome: Progressing   Problem: Activity: Goal: Risk for activity intolerance will decrease Outcome: Not Progressing   Problem: Coping: Goal: Level of anxiety will decrease Outcome: Not Progressing

## 2024-02-03 NOTE — Progress Notes (Addendum)
 MRI femur reviewed. Has evidence of osteomyelitis and multiple small abscesses in possible septic arthritis of left knee.  Already on IV Rocephin  and IV vancomycin . Consulted orthopedics Dr. Hermina Loosen. He will see the patient as a consult.  Also recommended transfer to [facility] to be seen by Dr. Julio Ohm.Mark Hammond

## 2024-02-04 ENCOUNTER — Inpatient Hospital Stay (HOSPITAL_COMMUNITY): Payer: MEDICAID

## 2024-02-04 DIAGNOSIS — M00852 Arthritis due to other bacteria, left hip: Secondary | ICD-10-CM | POA: Diagnosis not present

## 2024-02-04 DIAGNOSIS — L03116 Cellulitis of left lower limb: Secondary | ICD-10-CM | POA: Diagnosis not present

## 2024-02-04 DIAGNOSIS — F1910 Other psychoactive substance abuse, uncomplicated: Secondary | ICD-10-CM | POA: Diagnosis not present

## 2024-02-04 DIAGNOSIS — A419 Sepsis, unspecified organism: Secondary | ICD-10-CM | POA: Diagnosis not present

## 2024-02-04 DIAGNOSIS — R6520 Severe sepsis without septic shock: Secondary | ICD-10-CM | POA: Diagnosis not present

## 2024-02-04 LAB — TYPE AND SCREEN
ABO/RH(D): O POS
Antibody Screen: NEGATIVE

## 2024-02-04 LAB — CBC WITH DIFFERENTIAL/PLATELET
Abs Immature Granulocytes: 0.03 10*3/uL (ref 0.00–0.07)
Basophils Absolute: 0 10*3/uL (ref 0.0–0.1)
Basophils Relative: 1 %
Eosinophils Absolute: 0.1 10*3/uL (ref 0.0–0.5)
Eosinophils Relative: 3 %
HCT: 27.7 % — ABNORMAL LOW (ref 39.0–52.0)
Hemoglobin: 8.9 g/dL — ABNORMAL LOW (ref 13.0–17.0)
Immature Granulocytes: 1 %
Lymphocytes Relative: 28 %
Lymphs Abs: 1.1 10*3/uL (ref 0.7–4.0)
MCH: 28.6 pg (ref 26.0–34.0)
MCHC: 32.1 g/dL (ref 30.0–36.0)
MCV: 89.1 fL (ref 80.0–100.0)
Monocytes Absolute: 0.6 10*3/uL (ref 0.1–1.0)
Monocytes Relative: 14 %
Neutro Abs: 2.2 10*3/uL (ref 1.7–7.7)
Neutrophils Relative %: 53 %
Platelets: 214 10*3/uL (ref 150–400)
RBC: 3.11 MIL/uL — ABNORMAL LOW (ref 4.22–5.81)
RDW: 14.6 % (ref 11.5–15.5)
WBC: 4.1 10*3/uL (ref 4.0–10.5)
nRBC: 0 % (ref 0.0–0.2)

## 2024-02-04 LAB — COMPREHENSIVE METABOLIC PANEL WITH GFR
ALT: 61 U/L — ABNORMAL HIGH (ref 0–44)
AST: 143 U/L — ABNORMAL HIGH (ref 15–41)
Albumin: 1.9 g/dL — ABNORMAL LOW (ref 3.5–5.0)
Alkaline Phosphatase: 99 U/L (ref 38–126)
Anion gap: 10 (ref 5–15)
BUN: <5 mg/dL — ABNORMAL LOW (ref 6–20)
CO2: 26 mmol/L (ref 22–32)
Calcium: 7.7 mg/dL — ABNORMAL LOW (ref 8.9–10.3)
Chloride: 97 mmol/L — ABNORMAL LOW (ref 98–111)
Creatinine, Ser: 0.55 mg/dL — ABNORMAL LOW (ref 0.61–1.24)
GFR, Estimated: >60 mL/min (ref >60)
Glucose, Bld: 109 mg/dL — ABNORMAL HIGH (ref 70–99)
Potassium: 3.2 mmol/L — ABNORMAL LOW (ref 3.5–5.1)
Sodium: 133 mmol/L — ABNORMAL LOW (ref 135–145)
Total Bilirubin: 0.8 mg/dL (ref 0.0–1.2)
Total Protein: 5.7 g/dL — ABNORMAL LOW (ref 6.5–8.1)

## 2024-02-04 LAB — LACTIC ACID, PLASMA
Lactic Acid, Venous: 1.1 mmol/L (ref 0.5–1.9)
Lactic Acid, Venous: 1.2 mmol/L (ref 0.5–1.9)

## 2024-02-04 LAB — C-REACTIVE PROTEIN: CRP: 10.6 mg/dL — ABNORMAL HIGH (ref <1.0)

## 2024-02-04 LAB — PHOSPHORUS: Phosphorus: 3.4 mg/dL (ref 2.5–4.6)

## 2024-02-04 LAB — MAGNESIUM: Magnesium: 1.6 mg/dL — ABNORMAL LOW (ref 1.7–2.4)

## 2024-02-04 LAB — PROCALCITONIN: Procalcitonin: 0.27 ng/mL

## 2024-02-04 LAB — ABO/RH: ABO/RH(D): O POS

## 2024-02-04 LAB — PROTIME-INR
INR: 1.2 (ref 0.8–1.2)
Prothrombin Time: 15.3 s — ABNORMAL HIGH (ref 11.4–15.2)

## 2024-02-04 MED ORDER — PANTOPRAZOLE SODIUM 40 MG PO TBEC
40.0000 mg | DELAYED_RELEASE_TABLET | Freq: Every day | ORAL | Status: DC
  Administered 2024-02-04 – 2024-02-16 (×13): 40 mg via ORAL
  Filled 2024-02-04 (×14): qty 1

## 2024-02-04 MED ORDER — LACTATED RINGERS IV SOLN: INTRAVENOUS | Status: AC

## 2024-02-04 MED ORDER — HEPARIN SODIUM (PORCINE) 5000 UNIT/ML IJ SOLN
5000.0000 [IU] | Freq: Three times a day (TID) | INTRAMUSCULAR | Status: DC
  Administered 2024-02-04: 5000 [IU] via SUBCUTANEOUS
  Filled 2024-02-04: qty 1

## 2024-02-04 MED ORDER — LACTATED RINGERS IV BOLUS: 1000.0000 mL | Freq: Once | INTRAVENOUS | Status: DC

## 2024-02-04 MED ORDER — METRONIDAZOLE 500 MG PO TABS
500.0000 mg | ORAL_TABLET | Freq: Two times a day (BID) | ORAL | Status: DC
  Administered 2024-02-04: 500 mg via ORAL
  Filled 2024-02-04: qty 1

## 2024-02-04 MED ORDER — LACTATED RINGERS IV BOLUS
1000.0000 mL | Freq: Once | INTRAVENOUS | Status: AC
  Administered 2024-02-04: 1000 mL via INTRAVENOUS

## 2024-02-04 MED ORDER — PIPERACILLIN-TAZOBACTAM 3.375 G IVPB: 3.3750 g | Freq: Three times a day (TID) | INTRAVENOUS | Status: DC

## 2024-02-04 MED ORDER — METRONIDAZOLE 500 MG/100ML IV SOLN
500.0000 mg | Freq: Two times a day (BID) | INTRAVENOUS | Status: DC
  Administered 2024-02-05 – 2024-02-06 (×4): 500 mg via INTRAVENOUS
  Filled 2024-02-04 (×3): qty 100

## 2024-02-04 MED ORDER — HEPARIN SODIUM (PORCINE) 5000 UNIT/ML IJ SOLN
5000.0000 [IU] | Freq: Three times a day (TID) | INTRAMUSCULAR | Status: DC
  Administered 2024-02-04 – 2024-02-06 (×7): 5000 [IU] via SUBCUTANEOUS
  Filled 2024-02-04 (×8): qty 1

## 2024-02-04 MED ORDER — MORPHINE SULFATE (PF) 2 MG/ML IV SOLN
1.0000 mg | INTRAVENOUS | Status: DC | PRN
  Administered 2024-02-04 – 2024-02-09 (×21): 1 mg via INTRAVENOUS
  Filled 2024-02-04 (×22): qty 1

## 2024-02-04 MED ORDER — PIPERACILLIN-TAZOBACTAM 3.375 G IVPB
3.3750 g | Freq: Three times a day (TID) | INTRAVENOUS | Status: DC
  Administered 2024-02-04: 3.375 g via INTRAVENOUS
  Filled 2024-02-04: qty 50

## 2024-02-04 MED ORDER — SODIUM CHLORIDE 0.9 % IV SOLN
2.0000 g | INTRAVENOUS | Status: DC
  Administered 2024-02-04 – 2024-02-07 (×4): 2 g via INTRAVENOUS
  Filled 2024-02-04 (×4): qty 20

## 2024-02-04 MED ORDER — MAGNESIUM SULFATE 2 GM/50ML IV SOLN
2.0000 g | Freq: Once | INTRAVENOUS | Status: AC
  Administered 2024-02-04: 2 g via INTRAVENOUS
  Filled 2024-02-04: qty 50

## 2024-02-04 MED ORDER — IOHEXOL 350 MG/ML SOLN
75.0000 mL | Freq: Once | INTRAVENOUS | Status: AC | PRN
  Administered 2024-02-04: 75 mL via INTRAVENOUS

## 2024-02-04 NOTE — Consult Note (Signed)
 [facility]    Date of Admission:  01/31/2024           Reason for Consult: left hip osteo    Principal Problem:   Cellulitis of left lower extremity Active Problems:   Polysubstance abuse (HCC)   Severe sepsis (HCC)   Hypokalemia   Bipolar 1 disorder (HCC)   Chronic alcohol abuse   Anemia of chronic disease   Transaminitis   Assessment: 33 year old male with bipolar disorder, polysubstance abuse, paresthesia between left thigh and knee since July 2025 presented due to worsening left foot pain.  Started on vancomycin  and ceftriaxone .  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  Antibiotic management, consider IR engagement for aspiration of hip - CT pelvis showed proximal thigh cellulitis intramuscular abscesses. -Left foot with mild swelling, xray no acute abnormailities - Pt did not volunteer any additional history during interview today Recommendations:  -Engage IR for aspiration hip - Add metronidazole - Continue vancomycin  and ceftriaxone , discontinue pip-tazo - Not a home IV antibiotic candidate given drug use.. 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 male with past medical history of bipolar disorder, polysubstance abuse, chronic alcohol abuse, chronic left thigh pain, chronic left thigh numbness, chronic transaminitis was admitted to [facility] on 4/30 for sepsis due  to cellulitis of left foot.  He was having left hip, left knee paresthesias since January 2025.  Underwent MRI lumbar spine showed no evidence of acute process.  Seen by outpatient neurology recommended MRI of the left femur.  He had increased pain and swelling of left foot, admitted for sepsis secondary to cellulitis of the left foot. MRI of left femur with and without contrast showed left hip and proximal femur septic arthritis/osteomyelitis.  Multiple diffuse enhancement throughout left thigh suspicious for abscesses.  CT pelvis showed left proximal thigh cellulitis with intramuscular abscess.Orthopedics engaged recommended pelvic scan resulted as above.  No indication for arthrotomy.  Could have radiology aspirated anterior hip fluid Patient has been on ceftriaxone  and vancomycin . Review of Systems: Review of Systems  All other systems reviewed and are negative.   Past Medical History:  Diagnosis Date   Alcoholic gastritis    Bipolar 1 disorder (HCC)    Dental abscess    Depression    Drug abuse (HCC)    ETOH abuse    Hallucination    Schizo affective schizophrenia (HCC)     Social History   Tobacco Use   Smoking status: Every Day    Current packs/day: 1.00    Types: Cigarettes    Passive exposure: Current   Smokeless tobacco: Never  Vaping Use   Vaping status: Every Day   Substances: THC  Substance Use Topics   Alcohol use: Yes    Comment: 4-5x 40oz beers daily   Drug use: Yes    Frequency: 7.0 times per week    Types: "Crack" cocaine , Heroin, MDMA (Ecstacy), Marijuana, Cocaine     Comment: 01/22/24  Family History  Problem Relation Age of Onset   Stroke Other    Scheduled Meds:  ferrous sulfate   325 mg Oral Q breakfast   folic acid   1 mg Oral Daily   heparin injection (subcutaneous)  5,000 Units Subcutaneous Q8H   multivitamin with minerals  1 tablet Oral Daily   pantoprazole  40 mg Oral Daily   QUEtiapine   100 mg Oral QHS   thiamine  (VITAMIN B1) injection   100 mg Intravenous Daily   Continuous Infusions:  lactated ringers  125 mL/hr at 02/04/24 0840   piperacillin-tazobactam (ZOSYN)  IV     vancomycin  1,000 mg (02/04/24 0845)   PRN Meds:.acetaminophen  **OR** acetaminophen , lip balm, melatonin, naLOXone  (NARCAN )  injection, nicotine , ondansetron  (ZOFRAN ) IV, pneumococcal 20-valent conjugate vaccine Allergies  Allergen Reactions   Codeine Other (See Comments)    Patient states parents told him he was allergic at a young age.   Fish Allergy Other (See Comments)    Patient does not wish to eat ANY fish- reaction unclear   Shellfish Allergy Other (See Comments)    Patient does not wish to eat ANY shellfish- reaction unclear    OBJECTIVE: Blood pressure 108/69, pulse 89, temperature 98.8 F (37.1 C), temperature source Oral, resp. rate 17, weight 56 kg, SpO2 96%.  Physical Exam Constitutional:      General: He is not in acute distress.    Appearance: He is normal weight. He is not toxic-appearing.  HENT:     Head: Normocephalic and atraumatic.     Right Ear: External ear normal.     Left Ear: External ear normal.     Nose: No congestion or rhinorrhea.     Mouth/Throat:     Mouth: Mucous membranes are moist.     Pharynx: Oropharynx is clear.  Eyes:     Extraocular Movements: Extraocular movements intact.     Conjunctiva/sclera: Conjunctivae normal.     Pupils: Pupils are equal, round, and reactive to light.  Cardiovascular:     Rate and Rhythm: Normal rate and regular rhythm.     Heart sounds: No murmur heard.    No friction rub. No gallop.  Pulmonary:     Effort: Pulmonary effort is normal.     Breath sounds: Normal breath sounds.  Abdominal:     General: Abdomen is flat. Bowel sounds are normal.     Palpations: Abdomen is soft.  Musculoskeletal:        General: No swelling.     Cervical back: Normal range of motion and neck supple.  Skin:    General: Skin is warm and dry.  Neurological:     General: No focal deficit  present.     Mental Status: He is oriented to person, place, and time.  Psychiatric:        Mood and Affect: Mood normal.     Lab Results Lab Results  Component Value Date   WBC 4.1 02/04/2024   HGB 8.9 (L) 02/04/2024   HCT 27.7 (L) 02/04/2024   MCV 89.1 02/04/2024   PLT 214 02/04/2024    Lab Results  Component Value Date   CREATININE 0.56 (L) 02/03/2024   BUN 6 02/03/2024   NA 135 02/03/2024   K 3.7 02/03/2024   CL 100 02/03/2024   CO2 24 02/03/2024    Lab Results  Component Value Date   ALT 40 02/03/2024   AST 68 (H) 02/03/2024   ALKPHOS 86 02/03/2024   BILITOT 0.8 02/03/2024  Orlie Bjornstad, MD [facility] 02/04/2024, 12:13 PM

## 2024-02-04 NOTE — Consult Note (Signed)
 NAME:  Mark Hammond, MRN:  [id number], DOB:  1991/05/02, LOS: 4 ADMISSION DATE:  01/31/2024, CONSULTATION DATE:  5/4 REFERRING MD:  Zelda Hickman, CHIEF COMPLAINT:  septic shock,    History of Present Illness:  33 year old male patient with a significant psychological and polysubstance abuse history, listed below.  Was admitted on 4/30 at [facility] with cellulitis of the left foot, and significant left hip discomfort with swelling and warmth.  Was placed on broad-spectrum antibiotics, diagnostic evaluation concerning for septic arthritis and osteomyelitis of the left hip and proximal femur.  Transferred to [facility] from [place] on 5/4.  During the early a.m. hours found to be more encephalopathic, as well as hypotensive.  Was seen by the internal medicine, on initial evaluation was obtunded with systolic blood pressure in low 90s.  He was given 3 L of crystalloid with improvement in blood pressure, and slight improvement in mental status. Now been seen by orthopedic surgery, who have recommended aspiration of left hip fluid collection as well as further imaging.  Currently no indication for surgical intervention.  Critical care was asked to evaluate given concern about progressive sepsis and high risk of clinical decompensation Pertinent  Medical History  Polysubstance, alcohol, tobacco, heroin, MDMA, marijuana, bipolar disease, schizoaffective disorder  Significant Hospital Events: Including procedures, antibiotic start and stop dates in addition to other pertinent events   4/30 admitted: cultures sent. Abx started (cefepime  and vanc) 5/1 concern about withdrawal placed on CIWA multiple electrolyte abnormalities 5/2 remained tachycardic heart rate in the 120s continue to be treated for withdrawal with Librium  protocol clinically left foot seem to be improving 5/3 swelling of the left knee, fever, tachypnea remained on ceftriaxone  and vancomycin , MRI reordered.  MRI showed evidence of  osteomyelitis with also possible fluid collection in the left knee.  Orthopedic surgery was called and recommended transfer to [facility] 5/4 ongoing encephalopathy hypotension treated with IV crystalloid.  Seen by orthopedics felt no surgical intervention needed at this time but recommended aspiration of left anterior hip fluid collection.  Also agreed with further evaluation of scanning the pelvis by CT scan.  Pulmonary/critical care asked to evaluate with concern for worsening septic shock ceftriaxone  widened to Zosyn  Interim History / Subjective:  Lying in bed, no acute distress but does report he gets a little short of breath talking he is easily agitated  Objective   Blood pressure 106/60, pulse 95, temperature 98.8 F (37.1 C), temperature source Oral, resp. rate (!) 22, weight 56 kg, SpO2 96%.        Intake/Output Summary (Last 24 hours) at 02/04/2024 1103 Last data filed at 02/04/2024 0600 Gross per 24 hour  Intake 801.94 ml  Output 1800 ml  Net -998.06 ml   Filed Weights   02/03/24 0428 02/04/24 0450  Weight: 55.8 kg 56 kg    Examination: General: Lying in bed no acute distress but is encephalopathic, slow to respond easily agitated  HENT: Normocephalic poor dentition mucous membranes moist no JVD Lungs: Clear to auscultation diminished in the bases no accessory use on room air but endorses shortness of breath with talking Cardiovascular: Regular rate and rhythm with holosystolic heart murmur Abdomen: Soft not tender Extremities: The left knee and thigh are painful, swelling not clearly evident on my exam Neuro: He is awake, oriented x 1-2 but easily agitated and confrontational no clear focal deficits appreciated reports generalized pain GU: Due to void  Resolved Hospital Problem list     Assessment &  Plan:  Severe sepsis secondary to 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, femoral consistent with septic arthritis, cellulitis of the proximal left thigh  intramuscular abscess,- Responded to volume resuscitation, lactate negative Plan Continue current antibiotics, currently day #5 vancomycin , day #1 Zosyn, had been on ceftriaxone  Keep him euvolemic now that he is status post volume resuscitation Follow-up pending cultures, to date negative IR has been consulted for possible needle aspiration of left thigh fluid collection Infectious diseases been consulted At this point without lactic acidosis tachycardia or hemodynamic instability no indication for transfer to the intensive care that being said this could certainly change We will go ahead and obtain echocardiogram to rule out endocarditis although cultures have been negative  Dyspnea. Plan Obtain chest x-ray Careful with narcotics Pulse oximetry  Acute metabolic encephalopathy secondary to sepsis.  Further complicated by significant psychological disorder including bipolar disease and schizoaffective disorder.  He also has a history of polysubstance abuse and alcohol abuse.  He is now day #5 hospitalization making delirium more likely than withdrawal Plan Continuing supportive care Pain management but careful with narcotics He is status post Librium  taper I agree with this, focus is now been changed to delirium  3 with continuing his home Seroquel  He should be outside the window for withdrawal, and has completed Librium  taper so no rush to resume his Neurontin  unless we feel like this will help with pain control  Anemia of critical illness without evidence of bleeding.  Also probably an element of hemodilution Plan Trend CBC Transfuse for active bleeding or hemoglobin less than 7  He currently does not need transfer to the intensive care, however he continues to remain at high risk for clinical decompensation.  Would continue current antibiotics, agree with left thigh fluid collection sampling.  Have written for echocardiogram.  Chest x-ray pending.  Will review the chest x-ray, but for  now nothing additional to add Critical care will be available as needed, please call back should he decline clinically  Best Practice (right click and "Reselect all SmartList Selections" daily)   Diet/type: NPO w/ oral meds DVT prophylaxis prophylactic heparin  Pressure ulcer(s): N/A GI prophylaxis: N/A and PPI Lines: N/A Foley:  N/A Code Status:  full code Last date of multidisciplinary goals of care discussion [pending ]  Labs   CBC: Recent Labs  Lab 01/31/24 1934 02/01/24 0442 02/02/24 0313 02/03/24 0324 02/04/24 0632  WBC 13.5* 8.1 8.4 6.7 4.1  NEUTROABS 10.3* 6.6  --  4.2 2.2  HGB 11.0* 9.8* 10.8* 9.3* 8.9*  HCT 32.9* 30.4* 34.0* 30.9* 27.7*  MCV 88.2 91.3 91.9 95.7 89.1  PLT 497* 304 266 232 214    Basic Metabolic Panel: Recent Labs  Lab 01/31/24 1934 01/31/24 2154 02/01/24 0442 02/02/24 0313 02/03/24 0324  NA 133*  --  129* 133* 135  K 3.3*  --  3.3* 3.4* 3.7  CL 93*  --  96* 97* 100  CO2 23  --  23 27 24   GLUCOSE 97  --  237* 103* 99  BUN 8  --  6 <5* 6  CREATININE 0.57*  --  0.47* 0.42* 0.56*  CALCIUM 9.0  --  7.4* 8.0* 8.1*  MG  --  1.7 1.6* 1.5* 1.7  PHOS  --   --  2.4* 2.8 3.9   GFR: Estimated Creatinine Clearance: 105 mL/min (A) (by C-G formula based on SCr of 0.56 mg/dL (L)). Recent Labs  Lab 01/31/24 1937 01/31/24 2116 02/01/24 0442 02/02/24 0313 02/03/24  0454 02/04/24 0632 02/04/24 0916  PROCALCITON  --   --   --   --   --  0.27  --   WBC  --   --  8.1 8.4 6.7 4.1  --   LATICACIDVEN 2.8* 1.1  --   --   --   --  1.1    Liver Function Tests: Recent Labs  Lab 01/31/24 1934 02/01/24 0442 02/02/24 0313 02/03/24 0324  AST 116* 77* 87* 68*  ALT 61* 45* 47* 40  ALKPHOS 141* 101 107 86  BILITOT 1.4* 1.2 1.2 0.8  PROT 8.4* 6.2* 6.5 5.7*  ALBUMIN 3.1* 2.2* 2.4* 2.1*   Recent Labs  Lab 01/31/24 1934  LIPASE 18   No results for input(s): "AMMONIA" in the last 168 hours.  ABG    Component Value Date/Time   TCO2 27 11/19/2023  2201     Coagulation Profile: Recent Labs  Lab 02/01/24 0442 02/04/24 0632  INR 1.4* 1.2    Cardiac Enzymes: Recent Labs  Lab 01/31/24 1934  CKTOTAL 305    HbA1C: Hgb A1c MFr Bld  Date/Time Value Ref Range Status  12/08/2023 12:00 AM 6.0 (H) 4.8 - 5.6 % Final    Comment:    (NOTE) Pre diabetes:          5.7%-6.4%  Diabetes:              >6.4%  Glycemic control for   <7.0% adults with diabetes   11/25/2023 06:11 AM 5.9 (H) 4.8 - 5.6 % Final    Comment:    (NOTE) Pre diabetes:          5.7%-6.4%  Diabetes:              >6.4%  Glycemic control for   <7.0% adults with diabetes     CBG: No results for input(s): "GLUCAP" in the last 168 hours.  Review of Systems:   Delirium making him unable to participate in ROS  Past Medical History:  He,  has a past medical history of Alcoholic gastritis, Bipolar 1 disorder (HCC), Dental abscess, Depression, Drug abuse (HCC), ETOH abuse, Hallucination, and Schizo affective schizophrenia (HCC).   Surgical History:  History reviewed. No pertinent surgical history.   Social History:   reports that he has been smoking cigarettes. He has been exposed to tobacco smoke. He has never used smokeless tobacco. He reports current alcohol use. He reports current drug use. Frequency: 7.00 times per week. Drugs: "Crack" cocaine , Heroin, MDMA (Ecstacy), Marijuana, and Cocaine .   Family History:  His family history includes Stroke in an other family member.   Allergies Allergies  Allergen Reactions   Codeine Other (See Comments)    Patient states parents told him he was allergic at a young age.   Fish Allergy Other (See Comments)    Patient does not wish to eat ANY fish- reaction unclear   Shellfish Allergy Other (See Comments)    Patient does not wish to eat ANY shellfish- reaction unclear     Home Medications  Prior to Admission medications   Medication Sig Start Date End Date Taking? Authorizing Provider  acetaminophen   (TYLENOL ) 500 MG tablet Take 500 mg by mouth every 6 (six) hours as needed (for pain).   Yes [provider]  hydrOXYzine  (ATARAX ) 25 MG tablet Take 25 mg by mouth every 6 (six) hours as needed for anxiety (or sleep).   Yes [provider]  Ibuprofen  200 MG CAPS Take 200-400 mg  by mouth every 6 (six) hours as needed (for pain).   Yes [provider]  naloxone  (NARCAN ) nasal spray 4 mg/0.1 mL Place 1 spray into the nose See admin instructions. USE 1 SPRAY INTO ONE NOSTRIL ONCE FOR 1 DOSE, THEN ANOTHER SPRAY MAY BE GIVEN INTO THE OTHER NOSTRIL EVERY 2-3 MINUTES UNTIL THE RECIPIENT RESPONDS   Yes [provider]  Nicotine  (NICODERM CQ  TD) Place 1 patch onto the skin daily as needed (for smoking cessation).   Yes [provider]  QUEtiapine  (SEROQUEL ) 100 MG tablet Take 100 mg by mouth at bedtime.   Yes [provider]  traZODone  (DESYREL ) 50 MG tablet Take 1 tablet (50 mg total) by mouth at bedtime as needed and may repeat dose one time if needed for sleep. 01/17/24  Yes Joice Nares, MD  ferrous sulfate  325 (65 FE) MG tablet Take 1 tablet (325 mg total) by mouth daily with breakfast. 01/17/24   Joice Nares, MD  gabapentin  (NEURONTIN ) 300 MG capsule Take 1 capsule (300 mg total) by mouth 2 (two) times daily. Patient not taking: Reported on 01/31/2024 01/17/24   Joice Nares, MD    Critical care time: 32 min

## 2024-02-04 NOTE — Evaluation (Signed)
 Occupational Therapy Evaluation Patient Details Name: Mark Hammond MRN: [id number] DOB: 18-Jun-1991 Today's Date: 02/04/2024   History of Present Illness   Pt is 33 yo presenting to [facility] ED on 4/30 due to L leg pain for several months. MRI of lumbar spine was negative. 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 Pmh: Alcoholic gastritis, BPD, Depression, drug abuse, ETOH abuse, hallucination, schizo affective schizophrenia.     Clinical Impressions Pt questionable historian, reports walking long distances with his rollator, occasionally stays at a shelter but primarily was living on the street. Pt currently with impaired cognition and impulsivity, needing up to mod A for ADLs, max +2 for bed mobility and min-mod A+2 for standing at EOB. Pt saturated urine upon arrival due to failed primofit, pt able to stand long enough to get bed sheets changed, then becoming agitated with therapist's cues to return to bed. Pt presenting with impairments listed below, will follow acutely. Patient will benefit from continued inpatient follow up therapy, <3 hours/day to maximize safety/ind with ADL/functional mobility.      If plan is discharge home, recommend the following:   Two people to help with walking and/or transfers;A lot of help with bathing/dressing/bathroom;Assistance with cooking/housework;Direct supervision/assist for medications management;Direct supervision/assist for financial management;Assist for transportation;Help with stairs or ramp for entrance     Functional Status Assessment   Patient has had a recent decline in their functional status and demonstrates the ability to make significant improvements in function in a reasonable and predictable amount of time.     Equipment Recommendations   Wheelchair (measurements OT);Wheelchair cushion (measurements  OT)     Recommendations for Other Services   PT consult     Precautions/Restrictions   Precautions Precautions: Fall Recall of Precautions/Restrictions: Impaired Restrictions Weight Bearing Restrictions Per Provider Order: No     Mobility Bed Mobility Overal bed mobility: Needs Assistance Bed Mobility: Supine to Sit, Sit to Supine     Supine to sit: +2 for physical assistance, Max assist, +2 for safety/equipment Sit to supine: Max assist, +2 for physical assistance, +2 for safety/equipment, Total assist   General bed mobility comments: Pt attempting to stand and "crawl" into bed to lay down down on his R side, despite cues from PT/OT to lay down on L side and roll to R side.    Transfers Overall transfer level: Needs assistance Equipment used: Rolling walker (2 wheels), 2 person hand held assist Transfers: Sit to/from Stand Sit to Stand: Mod assist, Min assist, +2 physical assistance           General transfer comment: pt reaching and grabbing onto OT/PT vs using RW, does not follow cues for hand positioning for safe transfers      Balance Overall balance assessment: Needs assistance Sitting-balance support: Bilateral upper extremity supported, Feet supported, Feet unsupported Sitting balance-Leahy Scale: Poor Sitting balance - Comments: Initially Max A for balance sitting EOB. With pt feet on floor close SBA with intermittent 2x Max A to prevent posterior LOB Postural control: Posterior lean Standing balance support: Bilateral upper extremity supported Standing balance-Leahy Scale: Poor Standing balance comment: Pt requries external support                           ADL either performed or assessed with clinical judgement   ADL Overall ADL's : Needs  assistance/impaired Eating/Feeding: Set up;Sitting   Grooming: Set up;Sitting   Upper Body Bathing: Minimal assistance;Sitting   Lower Body Bathing: Moderate assistance   Upper Body Dressing :  Minimal assistance   Lower Body Dressing: Moderate assistance   Toilet Transfer: Moderate assistance;+2 for physical assistance   Toileting- Clothing Manipulation and Hygiene: Moderate assistance Toileting - Clothing Manipulation Details (indicate cue type and reason): using urinal seated EOB     Functional mobility during ADLs: Moderate assistance       Vision   Additional Comments: will further assess     Perception Perception: Not tested       Praxis Praxis: Not tested       Pertinent Vitals/Pain Pain Assessment Pain Assessment: Faces Pain Score: 8  Faces Pain Scale: Hurts whole lot Pain Location: LLE with movement/touch Pain Descriptors / Indicators: Grimacing, Guarding Pain Intervention(s): Limited activity within patient's tolerance, Monitored during session, Repositioned     Extremity/Trunk Assessment Upper Extremity Assessment Upper Extremity Assessment: Generalized weakness   Lower Extremity Assessment Lower Extremity Assessment: Defer to PT evaluation LLE Deficits / Details: Pain in the LLE with movement/WB LLE: Unable to fully assess due to pain   Cervical / Trunk Assessment Cervical / Trunk Assessment: Normal   Communication Communication Communication: Impaired Factors Affecting Communication: Reduced clarity of speech   Cognition Arousal: Alert Behavior During Therapy: Agitated, Impulsive, Lability Cognition: No family/caregiver present to determine baseline             OT - Cognition Comments: pt  unaware he is at the hospital, mod cues to follow directions, follows inconsistently and impulsive with movement, states it is April 4th                 Following commands: Impaired Following commands impaired: Follows one step commands inconsistently     Cueing  General Comments   Cueing Techniques: Verbal cues;Gestural cues;Tactile cues;Visual cues  VSS   Exercises     Shoulder Instructions      Home Living Family/patient  expects to be discharged to:: Shelter/Homeless                                        Prior Functioning/Environment Prior Level of Function : Independent/Modified Independent;Patient poor historian/Family not available             Mobility Comments: Pt has rollator. Unclear how long he has been using it or in what way he has been transporting himself. ADLs Comments: Unclear. Pt is a poor historian.    OT Problem List: Decreased strength;Decreased range of motion;Decreased activity tolerance;Impaired balance (sitting and/or standing);Decreased cognition;Decreased safety awareness;Cardiopulmonary status limiting activity;Decreased knowledge of precautions   OT Treatment/Interventions: Self-care/ADL training;Therapeutic exercise;Energy conservation;DME and/or AE instruction;Therapeutic activities;Patient/family education;Balance training      OT Goals(Current goals can be found in the care plan section)   Acute Rehab OT Goals Patient Stated Goal: none stated OT Goal Formulation: With patient Time For Goal Achievement: 02/18/24 Potential to Achieve Goals: Good ADL Goals Pt Will Perform Upper Body Dressing: sitting;with contact guard assist Pt Will Perform Lower Body Dressing: with contact guard assist;sitting/lateral leans;sit to/from stand Pt Will Transfer to Toilet: with contact guard assist;ambulating;regular height toilet   OT Frequency:  Min 2X/week    Co-evaluation PT/OT/SLP Co-Evaluation/Treatment: Yes Reason for Co-Treatment: Necessary to address cognition/behavior during functional activity;For patient/therapist safety;To address functional/ADL transfers PT goals addressed during session:  Mobility/safety with mobility;Balance OT goals addressed during session: ADL's and self-care;Strengthening/ROM      AM-PAC OT "6 Clicks" Daily Activity     Outcome Measure Help from another person eating meals?: A Little Help from another person taking care of  personal grooming?: A Little Help from another person toileting, which includes using toliet, bedpan, or urinal?: A Lot Help from another person bathing (including washing, rinsing, drying)?: A Lot Help from another person to put on and taking off regular upper body clothing?: A Lot Help from another person to put on and taking off regular lower body clothing?: A Lot 6 Click Score: 14   End of Session Equipment Utilized During Treatment: Rolling walker (2 wheels) Nurse Communication: Mobility status  Activity Tolerance: Patient tolerated treatment well Patient left: in bed;with bed alarm set;with call bell/phone within reach  OT Visit Diagnosis: Unsteadiness on feet (R26.81);Other abnormalities of gait and mobility (R26.89);Muscle weakness (generalized) (M62.81)                Time: 1478-2956 OT Time Calculation (min): 27 min Charges:  OT General Charges $OT Visit: 1 Visit OT Evaluation $OT Eval Moderate Complexity: 1 Mod  Caydon Feasel K, OTD, OTR/L SecureChat Preferred Acute Rehab [phone]   Latera Mclin K Koonce 02/04/2024, 4:11 PM

## 2024-02-04 NOTE — Plan of Care (Signed)

## 2024-02-04 NOTE — Plan of Care (Signed)

## 2024-02-04 NOTE — Evaluation (Signed)
 Physical Therapy Evaluation Patient Details Name: Mark Hammond MRN: [id number] DOB: [birth year] Today's Date: 02/04/2024  History of Present Illness  Pt is 33 yo presenting to [facility] ED on 4/30 due to L leg pain for several months. MRI of lumbar spine was negative. 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 Pmh: Alcoholic gastritis, BPD, Depression, drug abuse, ETOH abuse, hallucination, schizo affective schizophrenia.  Clinical Impression  Pt is presenting below baseline level of functioning most likely though baseline was unclear. Pt reports he is homeless and using rollator in some fashion prior to hospitalization. Pt currently is Total A for bed mobility, 2 person Mod a to Min A for sit to stand and unable to progress gait. Pt is significantly limited by cognition and pain at this time. Due to pt current functional status, home set up and available assistance at home recommending skilled physical therapy services < 3 hours/day in order to address strength, balance and functional mobility to decrease risk for falls, injury, immobility, skin break down and re-hospitalization.          If plan is discharge home, recommend the following: Two people to help with walking and/or transfers;Assist for transportation;Assistance with cooking/housework;Supervision due to cognitive status     Equipment Recommendations Wheelchair cushion (measurements PT);Wheelchair (measurements PT)     Functional Status Assessment Patient has had a recent decline in their functional status and demonstrates the ability to make significant improvements in function in a reasonable and predictable amount of time.     Precautions / Restrictions Precautions Precautions: Fall Recall of Precautions/Restrictions: Impaired Restrictions Weight Bearing Restrictions Per Provider Order: No       Mobility  Bed Mobility Overal bed mobility: Needs Assistance Bed Mobility: Supine to Sit, Sit to Supine     Supine to sit: +2 for physical assistance, Max assist, +2 for safety/equipment Sit to supine: Max assist, +2 for physical assistance, +2 for safety/equipment, Total assist   General bed mobility comments: Pt was instructed on correct sequencing for rolling to get to sitting and pt continued to try to pull up from supine into long sitting in the bed. When pt was instructed to lay back down pt kept attempting to stan and perform a 360 turn to lay down on the R side; pt was instructed not to do this and initially assisted back into sitting. Pt started to get agitated and again stood and started forcing a turn getting tangled in his IV line despite maximum heavy multi modal cues to stop. Pt then layed on his R side on the very edge of the bed and requires physical therapys/occupational therapist assist to keep from falling on the floor. Pt was yelling that his leg hurt but was unable despite multi modal cues to understand he needed to scoot onto the bed to prevent fall. Eventually pt was able to slowly moved medially onto the bed to prevent a fall with Maximum/total A.    Transfers Overall transfer level: Needs assistance Equipment used: Rolling walker (2 wheels), 2 person hand held assist Transfers: Sit to/from Stand Sit to Stand: Mod assist, Min assist, +2 physical assistance           General transfer comment: 2 person Mod A sit to stand with RW and 2 person Min A with HHA. Pt then started trying to take steps moving his trunk L over BOS but  not moving his feet with heavy lean to the L.    Ambulation/Gait   General Gait Details: unable due to safety and poor command following.     Balance Overall balance assessment: Needs assistance Sitting-balance support: Bilateral upper extremity supported, Feet supported, Feet unsupported Sitting balance-Leahy Scale: Poor Sitting  balance - Comments: Initially Max A for balance sitting EOB. With pt feet on floor close SBA with intermittent 2x Max A to prevent posterior LOB Postural control: Posterior lean Standing balance support: Bilateral upper extremity supported Standing balance-Leahy Scale: Poor Standing balance comment: Pt requries external support         Pertinent Vitals/Pain Pain Assessment Pain Assessment: Faces Faces Pain Scale: Hurts whole lot Breathing: occasional labored breathing, short period of hyperventilation Negative Vocalization: occasional moan/groan, low speech, negative/disapproving quality Facial Expression: facial grimacing Body Language: rigid, fists clenched, knees up, pushing/pulling away, strikes out Consolability: unable to console, distract or reassure PAINAD Score: 8 Pain Location: LLE with movement/touch Pain Descriptors / Indicators: Grimacing, Guarding Pain Intervention(s): Limited activity within patient's tolerance, Monitored during session    Home Living Family/patient expects to be discharged to:: Other (Comment) (Pt is homeless)          Prior Function Prior Level of Function : Independent/Modified Independent;Patient poor historian/Family not available     Mobility Comments: Pt has rollator. Unclear how long he has been using it or in what way he has been transporting himself. ADLs Comments: Unclear. Pt is a poor historian.     Extremity/Trunk Assessment   Upper Extremity Assessment Upper Extremity Assessment: Defer to OT evaluation    Lower Extremity Assessment Lower Extremity Assessment: LLE deficits/detail LLE Deficits / Details: Pain in the LLE with movement/WB LLE: Unable to fully assess due to pain    Cervical / Trunk Assessment Cervical / Trunk Assessment: Normal  Communication   Communication Communication: Impaired Factors Affecting Communication: Reduced clarity of speech    Cognition Arousal: Alert Behavior During Therapy: Agitated,  Impulsive, Lability   PT - Cognitive impairments: No family/caregiver present to determine baseline, Orientation, Awareness, Memory, Attention, Initiation, Sequencing, Problem solving, Safety/Judgement   Orientation impairments: Person         PT - Cognition Comments: Pt very impulsive and labile. Unsafe Following commands: Impaired Following commands impaired: Follows one step commands inconsistently     Cueing Cueing Techniques: Verbal cues, Gestural cues, Tactile cues, Visual cues            Assessment/Plan    PT Assessment Patient needs continued PT services  PT Problem List Decreased strength;Decreased activity tolerance;Decreased balance;Decreased mobility;Decreased safety awareness;Decreased cognition;Pain       PT Treatment Interventions DME instruction;Balance training;Stair training;Gait training;Functional mobility training;Therapeutic activities;Therapeutic exercise;Patient/family education    PT Goals (Current goals can be found in the Care Plan section)  Acute Rehab PT Goals PT Goal Formulation: Patient unable to participate in goal setting Time For Goal Achievement: 02/18/24 Potential to Achieve Goals: Fair    Frequency Min 2X/week     Co-evaluation PT/OT/SLP Co-Evaluation/Treatment: Yes Reason for Co-Treatment: Necessary to address cognition/behavior during functional activity;For patient/therapist safety;To address functional/ADL transfers PT goals addressed during session: Mobility/safety with mobility;Balance OT goals addressed during session: ADL's and self-care;Strengthening/ROM       AM-PAC PT "6 Clicks" Mobility  Outcome Measure Help needed turning from your back to your side while in a flat bed without using bedrails?: Total Help needed moving from lying on your back to sitting on the side of a flat bed  without using bedrails?: Total Help needed moving to and from a bed to a chair (including a wheelchair)?: Total Help needed standing up from  a chair using your arms (e.g., wheelchair or bedside chair)?: Total Help needed to walk in hospital room?: Total Help needed climbing 3-5 steps with a railing? : Total 6 Click Score: 6    End of Session   Activity Tolerance: Patient limited by pain;Other (comment) (limited by cognition) Patient left: in bed;with call bell/phone within reach;with bed alarm set Nurse Communication: Mobility status PT Visit Diagnosis: Unsteadiness on feet (R26.81);Other abnormalities of gait and mobility (R26.89);Pain Pain - Right/Left: Left Pain - part of body: Hip;Knee    Time: 4132-4401 PT Time Calculation (min) (ACUTE ONLY): 27 min   Charges:   PT Evaluation $PT Eval Low Complexity: 1 Low   PT General Charges $$ ACUTE PT VISIT: 1 Visit         Sloan Duncans, DPT, CLT  Acute Rehabilitation Services Office: [phone] (Secure chat preferred)   Jenice Mitts 02/04/2024, 3:28 PM

## 2024-02-04 NOTE — Progress Notes (Signed)
 Pharmacy Antibiotic Note  Mark Hammond is a 33 y.o. male admitted on 01/31/2024 with cellulitis.  MRI femur highly suspicious for septic arthritis and osteomyelitis of the left hip and proximal left femur. Also suspicious for multiple abscesses. Pharmacy has been consulted for vancomycin  and zosyn dosing.  Patient transferred [facility] today. 24hr tmax 103.50F with no leukocytosis. STAT MRI pelvis has been ordered for further evaluation.  Plan: Start piperacillin-tazobactam 3.375mg  IV Q8H Continue vancomycin  1g IV Q12H (eAUC 533, Scr < 0.8) Plan to obtain vancomycin  levels tomorrow evening Follow renal function,cultures and clinical course  Weight: 56 kg (123 lb 7.3 oz)  Temp (24hrs), Avg:100.6 F (38.1 C), Min:98.3 F (36.8 C), Max:103.1 F (39.5 C)  Recent Labs  Lab 01/31/24 1934 01/31/24 1937 01/31/24 2116 02/01/24 0442 02/02/24 0313 02/03/24 0324 02/04/24 0632  WBC 13.5*  --   --  8.1 8.4 6.7 4.1  CREATININE 0.57*  --   --  0.47* 0.42* 0.56*  --   LATICACIDVEN  --  2.8* 1.1  --   --   --   --     Estimated Creatinine Clearance: 105 mL/min (A) (by C-G formula based on SCr of 0.56 mg/dL (L)).    Allergies  Allergen Reactions   Codeine Other (See Comments)    Patient states parents told him he was allergic at a young age.   Fish Allergy Other (See Comments)    Patient does not wish to eat ANY fish- reaction unclear   Shellfish Allergy Other (See Comments)    Patient does not wish to eat ANY shellfish- reaction unclear    Antimicrobials this admission: 4/30 cefepime  x 1 4/30 vancomycin  > 5/1 ceftriaxone  >5/4 5/4 Zosyn >  Dose adjustments this admission:   Microbiology results: 4/30 BCx: ngtd4 5/1 BCx: ngtd3  Abelina Abide, PharmD PGY1 Pharmacy Resident 02/04/2024 8:34 AM

## 2024-02-04 NOTE — Progress Notes (Addendum)
 PROGRESS NOTE                                                                                                                                                                                                             Patient Demographics:    Mark Hammond, is a 33 y.o. male, DOB - [birth year], QIO:[id number]  Outpatient Primary MD for the patient is Patient, No Pcp Per    LOS - 4  Admit date - 01/31/2024    Chief Complaint  Patient presents with   Leg Swelling   Generalized Pain       Brief Narrative (HPI from H&P)   33 y.o. male with PMH significant for polysubstance abuse including alcohol, tobacco, cocaine , heroin, MDMA, marijuana, bipolar disorder, schizoaffective schizophrenia, 4/30 patient presented to ED with complaint of left foot pain, swelling, warmth.  Further workup suggestive of diffuse septic arthritis of the left hip, proximal femur along with left hip and possible pelvic abscess.  He is currently septic.  Transferred to my care on 02/04/2024.    Subjective:    Mark Hammond today has, No headache, No chest pain, No abdominal pain - No Nausea, No new weakness tingling or numbness, no shortness of breath but is feeling diffusely weak, ongoing pain in the left thigh and hip.   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  Currently on vancomycin  and Rocephin , will broaden antibiotics from Rocephin  to Zosyn continue vancomycin , cultures pending, check lactic acid right now, initiate IV fluid bolus as he is hypotensive and appears toxic, continue maintenance.  Obtain CT of the pelvis with IV contrast to evaluate the extent of his fluid collection and abscess, case discussed with Dr. Julio Ohm, request IR to possibly drain his left hip/pelvic abscess, discussed with PCCM they will see the patient shortly, will also obtain ID input to titrate antibiotics as needed.  He is quite  sick.  Will monitor closely.  HX of polysubstance abuse including ongoing alcohol abuse, ongoing cocaine , amphetamine, cannabis abuse.  Counseled to abstain from all.  On CIWA protocol.   Anemia of chronic disease.  Monitor.  Type screen and check INR.       Condition - Extremely Guarded  Family Communication  :  None present  Code Status :  Full  Consults  :  Ortho, ID, PCCM, IR  PUD Prophylaxis :  PPI   Procedures  :     MRI - 1. Findings are highly suspicious for septic arthritis and osteomyelitis of the left hip and proximal left femur. The proximal extent of the pelvic involvement is not imaged. The sacroiliac joints are not included. 2. Diffuse edema and enhancement throughout the proximal left thigh musculature with multiple irregular peripherally enhancing fluid collections anteriorly in the proximal left thigh, largest superficial and lateral to the proximal rectus femoris muscle, suspicious for abscesses. 3. Incomplete visualization of the proximal extension of inflammation into the pelvis along the iliopsoas and obturator internus muscles. 4. Asymmetric subcutaneous edema within the visualized left pelvis and left thigh. 5. Recommend further evaluation of the pelvis (with CT or MRI) to better evaluate the proximal extent of the infection.      Disposition Plan  :    Status is: Inpatient   DVT Prophylaxis  :    SCDs Start: 01/31/24 2147  Lab Results  Component Value Date   PLT 214 02/04/2024    Diet :  Diet Order     None        Inpatient Medications  Scheduled Meds:  ferrous sulfate   325 mg Oral Q breakfast   folic acid   1 mg Oral Daily   multivitamin with minerals  1 tablet Oral Daily   QUEtiapine   100 mg Oral QHS   thiamine  (VITAMIN B1) injection  100 mg Intravenous Daily   Continuous Infusions:  lactated ringers      lactated ringers  125 mL/hr at 02/04/24 0840   piperacillin-tazobactam (ZOSYN)  IV     vancomycin  1,000 mg (02/04/24 0845)   PRN  Meds:.acetaminophen  **OR** acetaminophen , lip balm, melatonin, naLOXone  (NARCAN )  injection, nicotine , ondansetron  (ZOFRAN ) IV, pneumococcal 20-valent conjugate vaccine  Antibiotics  :    Anti-infectives (From admission, onward)    Start     Dose/Rate Route Frequency Ordered Stop   02/04/24 0915  piperacillin-tazobactam (ZOSYN) IVPB 3.375 g        3.375 g 12.5 mL/hr over 240 Minutes Intravenous Every 8 hours 02/04/24 0826     02/01/24 1000  vancomycin  (VANCOCIN ) IVPB 1000 mg/200 mL premix        1,000 mg 200 mL/hr over 60 Minutes Intravenous Every 12 hours 01/31/24 2208     02/01/24 0400  cefTRIAXone  (ROCEPHIN ) 1 g in sodium chloride  0.9 % 100 mL IVPB  Status:  Discontinued        1 g 200 mL/hr over 30 Minutes Intravenous Every 24 hours 01/31/24 2151 02/04/24 0824   01/31/24 1945  vancomycin  (VANCOCIN ) IVPB 1000 mg/200 mL premix        1,000 mg 200 mL/hr over 60 Minutes Intravenous  Once 01/31/24 1940 01/31/24 2346   01/31/24 1945  ceFEPIme  (MAXIPIME ) 2 g in sodium chloride  0.9 % 100 mL IVPB        2 g 200 mL/hr over 30 Minutes Intravenous  Once 01/31/24 1940 01/31/24 2042         Objective:   Vitals:   02/04/24 0450 02/04/24 0500 02/04/24 0800 02/04/24 0834  BP:   92/66   Pulse:  (!) 104 (!) 109   Resp:  19 (!) 22 20  Temp:   (!) 102.1 F (38.9 C)   TempSrc:   Oral   SpO2:  97% 98%   Weight: 56 kg       Wt Readings from Last 3  Encounters:  02/04/24 56 kg  01/26/24 56.7 kg  01/26/24 56.7 kg     Intake/Output Summary (Last 24 hours) at 02/04/2024 0848 Last data filed at 02/04/2024 0600 Gross per 24 hour  Intake 1104.22 ml  Output 1800 ml  Net -695.78 ml     Physical Exam  Awake but appears extremely weak, no focal deficits, left knee and thigh red swollen and warm [place].AT,PERRAL Supple Neck, No JVD,   Symmetrical Chest wall movement, Good air movement bilaterally, CTAB RRR,No Gallops,Rubs or new Murmurs,  +ve B.Sounds, Abd Soft, No tenderness,        Data  Review:    Recent Labs  Lab 01/31/24 1934 02/01/24 0442 02/02/24 0313 02/03/24 0324 02/04/24 0632  WBC 13.5* 8.1 8.4 6.7 4.1  HGB 11.0* 9.8* 10.8* 9.3* 8.9*  HCT 32.9* 30.4* 34.0* 30.9* 27.7*  PLT 497* 304 266 232 214  MCV 88.2 91.3 91.9 95.7 89.1  MCH 29.5 29.4 29.2 28.8 28.6  MCHC 33.4 32.2 31.8 30.1 32.1  RDW 15.1 15.0 14.9 14.8 14.6  LYMPHSABS 1.8 0.8  --  1.7 1.1  MONOABS 1.2* 0.7  --  0.7 0.6  EOSABS 0.0 0.0  --  0.1 0.1  BASOSABS 0.1 0.0  --  0.1 0.0    Recent Labs  Lab 01/31/24 1934 01/31/24 1935 01/31/24 1937 01/31/24 2116 01/31/24 2154 02/01/24 0442 02/02/24 0313 02/03/24 0324 02/04/24 0632  NA 133*  --   --   --   --  129* 133* 135  --   K 3.3*  --   --   --   --  3.3* 3.4* 3.7  --   CL 93*  --   --   --   --  96* 97* 100  --   CO2 23  --   --   --   --  23 27 24   --   ANIONGAP 17*  --   --   --   --  10 9 11   --   GLUCOSE 97  --   --   --   --  237* 103* 99  --   BUN 8  --   --   --   --  6 <5* 6  --   CREATININE 0.57*  --   --   --   --  0.47* 0.42* 0.56*  --   AST 116*  --   --   --   --  77* 87* 68*  --   ALT 61*  --   --   --   --  45* 47* 40  --   ALKPHOS 141*  --   --   --   --  101 107 86  --   BILITOT 1.4*  --   --   --   --  1.2 1.2 0.8  --   ALBUMIN 3.1*  --   --   --   --  2.2* 2.4* 2.1*  --   CRP  --  21.0*  --   --   --   --   --   --  10.6*  PROCALCITON  --   --   --   --   --   --   --   --  0.27  LATICACIDVEN  --   --  2.8* 1.1  --   --   --   --   --   INR  --   --   --   --   --  1.4*  --   --  1.2  MG  --   --   --   --  1.7 1.6* 1.5* 1.7  --   PHOS  --   --   --   --   --  2.4* 2.8 3.9  --   CALCIUM 9.0  --   --   --   --  7.4* 8.0* 8.1*  --       Recent Labs  Lab 01/31/24 1934 01/31/24 1935 01/31/24 1937 01/31/24 2116 01/31/24 2154 02/01/24 0442 02/02/24 0313 02/03/24 0324 02/04/24 0632  CRP  --  21.0*  --   --   --   --   --   --  10.6*  PROCALCITON  --   --   --   --   --   --   --   --  0.27  LATICACIDVEN  --    --  2.8* 1.1  --   --   --   --   --   INR  --   --   --   --   --  1.4*  --   --  1.2  MG  --   --   --   --  1.7 1.6* 1.5* 1.7  --   CALCIUM 9.0  --   --   --   --  7.4* 8.0* 8.1*  --     --------------------------------------------------------------------------------------------------------------- Lab Results  Component Value Date   CHOL 96 12/08/2023   HDL 34 (L) 12/08/2023   LDLCALC 51 12/08/2023   TRIG 55 12/08/2023   CHOLHDL 2.8 12/08/2023    Lab Results  Component Value Date   HGBA1C 6.0 (H) 12/08/2023   No results for input(s): "TSH", "T4TOTAL", "FREET4", "T3FREE", "THYROIDAB" in the last 72 hours. No results for input(s): "VITAMINB12", "FOLATE", "FERRITIN", "TIBC", "IRON", "RETICCTPCT" in the last 72 hours. ------------------------------------------------------------------------------------------------------------------ Cardiac Enzymes No results for input(s): "CKMB", "TROPONINI", "MYOGLOBIN" in the last 168 hours.  Invalid input(s): "CK"  Micro Results Recent Results (from the past 240 hours)  Resp panel by RT-PCR (RSV, Flu A&B, Covid) Anterior Nasal Swab     Status: None   Collection Time: 01/26/24 11:40 PM   Specimen: Anterior Nasal Swab  Result Value Ref Range Status   SARS Coronavirus 2 by RT PCR NEGATIVE NEGATIVE Final   Influenza A by PCR NEGATIVE NEGATIVE Final   Influenza B by PCR NEGATIVE NEGATIVE Final    Comment: (NOTE) The Xpert Xpress SARS-CoV-2/FLU/RSV plus assay is intended as an aid in the diagnosis of influenza from Nasopharyngeal swab specimens and should not be used as a sole basis for treatment. Nasal washings and aspirates are unacceptable for Xpert Xpress SARS-CoV-2/FLU/RSV testing.  Fact Sheet for Patients: BloggerCourse.com  Fact Sheet for Healthcare Providers: SeriousBroker.it  This test is not yet approved or cleared by the [place]  FDA and has been authorized for detection  and/or diagnosis of SARS-CoV-2 by FDA under an Emergency Use Authorization (EUA). This EUA will remain in effect (meaning this test can be used) for the duration of the COVID-19 declaration under Section 564(b)(1) of the Act, 21 U.S.C. section 360bbb-3(b)(1), unless the authorization is terminated or revoked.     Resp Syncytial Virus by PCR NEGATIVE NEGATIVE Final    Comment: (NOTE) Fact Sheet for Patients: BloggerCourse.com  Fact Sheet for Healthcare Providers: SeriousBroker.it  This test is not yet approved or cleared by the [place]  FDA and has been authorized for detection  and/or diagnosis of SARS-CoV-2 by FDA under an Emergency Use Authorization (EUA). This EUA will remain in effect (meaning this test can be used) for the duration of the COVID-19 declaration under Section 564(b)(1) of the Act, 21 U.S.C. section 360bbb-3(b)(1), unless the authorization is terminated or revoked.  Performed at [facility], [address] [place], [place] [address]   Blood culture (routine x 2)     Status: None (Preliminary result)   Collection Time: 01/31/24  8:00 PM   Specimen: BLOOD  Result Value Ref Range Status   Specimen Description   Final    BLOOD RIGHT ANTECUBITAL Performed at [facility], [address] [place], [place] [address]    Special Requests   Final    BOTTLES DRAWN AEROBIC AND ANAEROBIC Blood Culture results may not be optimal due to an inadequate volume of blood received in culture bottles Performed at [facility], [address] [place], [place] [address]    Culture   Final    NO GROWTH 4 DAYS Performed at [facility], [address], [place], [place] [address]    Report Status PENDING  Incomplete  Blood culture (routine x 2)     Status: None (Preliminary result)   Collection Time: 02/01/24  6:40 PM   Specimen: BLOOD LEFT ARM  Result Value Ref Range Status    Specimen Description   Final    BLOOD LEFT ARM Performed at [facility], [address], [place], [place] [address]    Special Requests   Final    BOTTLES DRAWN AEROBIC AND ANAEROBIC Blood Culture adequate volume Performed at [facility], [address] [place], [place] [address]    Culture   Final    NO GROWTH 3 DAYS Performed at [facility], [address] [place], [place] [address]    Report Status PENDING  Incomplete  MRSA Next Gen by PCR, Nasal     Status: None   Collection Time: 02/01/24  7:05 PM   Specimen: Nasal Mucosa; Nasal Swab  Result Value Ref Range Status   MRSA by PCR Next Gen NOT DETECTED NOT DETECTED Final    Comment: (NOTE) The GeneXpert MRSA Assay (FDA approved for NASAL specimens only), is one component of a comprehensive MRSA colonization surveillance program. It is not intended to diagnose MRSA infection nor to guide or monitor treatment for MRSA infections. Test performance is not FDA approved in patients less than 94 years old. Performed at [facility], [address], [place], [place] [address]     Radiology Report MR FEMUR LEFT W WO CONTRAST Result Date: 02/03/2024 CLINICAL DATA:  Acute limping.  Thigh pain.  Infection suspected. EXAM: MR OF THE LEFT LOWER EXTREMITY WITHOUT AND WITH CONTRAST TECHNIQUE: Multiplanar, multisequence MR imaging of the left thigh was performed both before and after administration of intravenous contrast. CONTRAST:  5mL GADAVIST  GADOBUTROL  1 MMOL/ML IV SOLN COMPARISON:  None recent. Left femur radiographs 11/27/2023. Pelvic CT 02/11/2022. FINDINGS: Bones/Joint/Cartilage The proximal left femur and left acetabulum are grossly abnormal and appeared normal on the radiographs performed 11/27/2023. There is diffuse replacement of the normal T1 marrow signal within the acetabulum, left femoral head and neck with cortical destruction and superior displacement of the femoral head.  There is associated diffusely increased T2 marrow signal and enhancement following contrast. Marrow changes extend into the proximal left femoral diaphysis. There is a moderate size irregular hip joint  effusion with synovial enhancement following contrast. Findings are highly suspicious for septic arthritis and osteomyelitis. The distal left femur appears unremarkable. The right hip and visualized right femur appear normal. The symphysis pubis is intact. The sacroiliac joints are incompletely visualized. Ligaments No significant ligamentous abnormalities identified. Muscles and Tendons Diffuse edema and enhancement throughout the proximal thigh musculature, including the iliacus and obturator internus muscles. There are multiple irregular peripherally enhancing fluid collections anteriorly in the proximal left thigh, largest superficial and lateral to the proximal rectus femoris muscle, measuring 3.3 x 2.3 x 4.1 cm. There are other smaller fluid collections anterior to the left hip and within the gluteus medius and minimus muscles. Edema extends into the mid to distal left thigh musculature without associated fluid collection distally. The quadriceps tendon is intact. Soft tissues Asymmetric subcutaneous edema within the visualized left pelvis and left thigh. Mild subcutaneous enhancement lateral to the left thigh. Incomplete visualization of the proximal extent of inflammation into the pelvis along the iliopsoas and obturator internus muscles. IMPRESSION: 1. Findings are highly suspicious for septic arthritis and osteomyelitis of the left hip and proximal left femur. The proximal extent of the pelvic involvement is not imaged. The sacroiliac joints are not included. 2. Diffuse edema and enhancement throughout the proximal left thigh musculature with multiple irregular peripherally enhancing fluid collections anteriorly in the proximal left thigh, largest superficial and lateral to the proximal rectus femoris  muscle, suspicious for abscesses. 3. Incomplete visualization of the proximal extension of inflammation into the pelvis along the iliopsoas and obturator internus muscles. 4. Asymmetric subcutaneous edema within the visualized left pelvis and left thigh. 5. Recommend further evaluation of the pelvis (with CT or MRI) to better evaluate the proximal extent of the infection. 6. These results will be called to the ordering clinician or representative by the Radiologist Assistant, and communication documented in the PACS or [employer]. Electronically Signed   By: Elmon Hagedorn M.D.   On: 02/03/2024 13:17     Signature  -   Lynnwood Sauer M.D on 02/04/2024 at 8:48 AM   -  To page go to [email or web address]

## 2024-02-04 NOTE — Consult Note (Signed)
 ORTHOPAEDIC CONSULTATION  REQUESTING PHYSICIAN: Cala Castleman, MD  Chief Complaint: Left leg pain.  HPI: Mark Hammond is a 33 y.o. male who presents with patient states he has had left leg pain for several months.  Patient has presented to the emergency room multiple times for the leg pain.  Patient has had an MRI scan of the lumbar spine February 16 and April 5.  With no evidence of infection or cord compression.  Past Medical History:  Diagnosis Date   Alcoholic gastritis    Bipolar 1 disorder (HCC)    Dental abscess    Depression    Drug abuse (HCC)    ETOH abuse    Hallucination    Schizo affective schizophrenia (HCC)    History reviewed. No pertinent surgical history. Social History   Socioeconomic History   Marital status: Single    Spouse name: Not on file   Number of children: Not on file   Years of education: Not on file   Highest education level: Not on file  Occupational History   Not on file  Tobacco Use   Smoking status: Every Day    Current packs/day: 1.00    Types: Cigarettes    Passive exposure: Current   Smokeless tobacco: Never  Vaping Use   Vaping status: Every Day   Substances: THC  Substance and Sexual Activity   Alcohol use: Yes    Comment: 4-5x 40oz beers daily   Drug use: Yes    Frequency: 7.0 times per week    Types: "Crack" cocaine , Heroin, MDMA (Ecstacy), Marijuana, Cocaine     Comment: 01/22/24   Sexual activity: Yes  Other Topics Concern   Not on file  Social History Narrative   Pt lives in [place] with ex-girlfriend.  Pt is not followed by an outpatient psychiatric provider.   Social Drivers of Corporate investment banker Strain: Not on file  Food Insecurity: Patient Unable To Answer (02/02/2024)   Hunger Vital Sign    Worried About Running Out of Food in the Last Year: Patient unable to answer    Ran Out of Food in the Last Year: Patient unable to answer  Recent Concern: Food Insecurity - Food Insecurity Present  (12/06/2023)   Hunger Vital Sign    Worried About Running Out of Food in the Last Year: Often true    Ran Out of Food in the Last Year: Often true  Transportation Needs: Patient Unable To Answer (02/02/2024)   PRAPARE - Administrator, Civil Service (Medical): Patient unable to answer    Lack of Transportation (Non-Medical): Patient unable to answer  Recent Concern: Transportation Needs - Unmet Transportation Needs (01/21/2024)   PRAPARE - Administrator, Civil Service (Medical): No    Lack of Transportation (Non-Medical): Yes  Physical Activity: Not on file  Stress: Not on file  Social Connections: Not on file   Family History  Problem Relation Age of Onset   Stroke Other    - negative except otherwise stated in the family history section Allergies  Allergen Reactions   Codeine Other (See Comments)    Patient states parents told him he was allergic at a young age.   Fish Allergy Other (See Comments)    Patient does not wish to eat ANY fish- reaction unclear   Shellfish Allergy Other (See Comments)    Patient does not wish to eat ANY shellfish- reaction unclear   Prior to Admission medications  Medication Sig Start Date End Date Taking? Authorizing Provider  acetaminophen  (TYLENOL ) 500 MG tablet Take 500 mg by mouth every 6 (six) hours as needed (for pain).   Yes [provider]  hydrOXYzine  (ATARAX ) 25 MG tablet Take 25 mg by mouth every 6 (six) hours as needed for anxiety (or sleep).   Yes [provider]  Ibuprofen  200 MG CAPS Take 200-400 mg by mouth every 6 (six) hours as needed (for pain).   Yes [provider]  naloxone  (NARCAN ) nasal spray 4 mg/0.1 mL Place 1 spray into the nose See admin instructions. USE 1 SPRAY INTO ONE NOSTRIL ONCE FOR 1 DOSE, THEN ANOTHER SPRAY MAY BE GIVEN INTO THE OTHER NOSTRIL EVERY 2-3 MINUTES UNTIL THE RECIPIENT RESPONDS   Yes [provider]  Nicotine  (NICODERM CQ  TD) Place 1 patch onto the  skin daily as needed (for smoking cessation).   Yes [provider]  QUEtiapine  (SEROQUEL ) 100 MG tablet Take 100 mg by mouth at bedtime.   Yes [provider]  traZODone  (DESYREL ) 50 MG tablet Take 1 tablet (50 mg total) by mouth at bedtime as needed and may repeat dose one time if needed for sleep. 01/17/24  Yes Joice Nares, MD  ferrous sulfate  325 (65 FE) MG tablet Take 1 tablet (325 mg total) by mouth daily with breakfast. 01/17/24   Joice Nares, MD  gabapentin  (NEURONTIN ) 300 MG capsule Take 1 capsule (300 mg total) by mouth 2 (two) times daily. Patient not taking: Reported on 01/31/2024 01/17/24   Joice Nares, MD   MR FEMUR LEFT W WO CONTRAST Result Date: 02/03/2024 CLINICAL DATA:  Acute limping.  Thigh pain.  Infection suspected. EXAM: MR OF THE LEFT LOWER EXTREMITY WITHOUT AND WITH CONTRAST TECHNIQUE: Multiplanar, multisequence MR imaging of the left thigh was performed both before and after administration of intravenous contrast. CONTRAST:  5mL GADAVIST  GADOBUTROL  1 MMOL/ML IV SOLN COMPARISON:  None recent. Left femur radiographs 11/27/2023. Pelvic CT 02/11/2022. FINDINGS: Bones/Joint/Cartilage The proximal left femur and left acetabulum are grossly abnormal and appeared normal on the radiographs performed 11/27/2023. There is diffuse replacement of the normal T1 marrow signal within the acetabulum, left femoral head and neck with cortical destruction and superior displacement of the femoral head. There is associated diffusely increased T2 marrow signal and enhancement following contrast. Marrow changes extend into the proximal left femoral diaphysis. There is a moderate size irregular hip joint effusion with synovial enhancement following contrast. Findings are highly suspicious for septic arthritis and osteomyelitis. The distal left femur appears unremarkable. The right hip and visualized right femur appear normal. The symphysis pubis is intact. The sacroiliac joints  are incompletely visualized. Ligaments No significant ligamentous abnormalities identified. Muscles and Tendons Diffuse edema and enhancement throughout the proximal thigh musculature, including the iliacus and obturator internus muscles. There are multiple irregular peripherally enhancing fluid collections anteriorly in the proximal left thigh, largest superficial and lateral to the proximal rectus femoris muscle, measuring 3.3 x 2.3 x 4.1 cm. There are other smaller fluid collections anterior to the left hip and within the gluteus medius and minimus muscles. Edema extends into the mid to distal left thigh musculature without associated fluid collection distally. The quadriceps tendon is intact. Soft tissues Asymmetric subcutaneous edema within the visualized left pelvis and left thigh. Mild subcutaneous enhancement lateral to the left thigh. Incomplete visualization of the proximal extent of inflammation into the pelvis along the iliopsoas and obturator internus muscles. IMPRESSION: 1. Findings are highly  suspicious for septic arthritis and osteomyelitis of the left hip and proximal left femur. The proximal extent of the pelvic involvement is not imaged. The sacroiliac joints are not included. 2. Diffuse edema and enhancement throughout the proximal left thigh musculature with multiple irregular peripherally enhancing fluid collections anteriorly in the proximal left thigh, largest superficial and lateral to the proximal rectus femoris muscle, suspicious for abscesses. 3. Incomplete visualization of the proximal extension of inflammation into the pelvis along the iliopsoas and obturator internus muscles. 4. Asymmetric subcutaneous edema within the visualized left pelvis and left thigh. 5. Recommend further evaluation of the pelvis (with CT or MRI) to better evaluate the proximal extent of the infection. 6. These results will be called to the ordering clinician or representative by the Radiologist Assistant, and  communication documented in the PACS or [employer]. Electronically Signed   By: Elmon Hagedorn M.D.   On: 02/03/2024 13:17   - pertinent xrays, CT, MRI studies were reviewed and independently interpreted  Positive ROS: All other systems have been reviewed and were otherwise negative with the exception of those mentioned in the HPI and as above.  Physical Exam: General: Alert, no acute distress Psychiatric: Patient is competent for consent with normal mood and affect Lymphatic: No axillary or cervical lymphadenopathy Cardiovascular: No pedal edema Respiratory: No cyanosis, no use of accessory musculature GI: No organomegaly, abdomen is soft and non-tender    Images:  @ENCIMAGES @  Labs:  Lab Results  Component Value Date   HGBA1C 6.0 (H) 12/08/2023   HGBA1C 5.9 (H) 11/25/2023   HGBA1C 5.4 08/04/2023   ESRSEDRATE 77 (H) 01/31/2024   ESRSEDRATE 77 (H) 11/19/2023   CRP 10.6 (H) 02/04/2024   CRP 21.0 (H) 01/31/2024   CRP 4.2 (H) 11/19/2023   REPTSTATUS PENDING 02/01/2024   CULT  02/01/2024    NO GROWTH 3 DAYS Performed at [facility], [address], [place], [place] [address]     Lab Results  Component Value Date   ALBUMIN 2.1 (L) 02/03/2024   ALBUMIN 2.4 (L) 02/02/2024   ALBUMIN 2.2 (L) 02/01/2024        Latest Ref Rng & Units 02/04/2024    6:32 AM 02/03/2024    3:24 AM 02/02/2024    3:13 AM  CBC EXTENDED  WBC 4.0 - 10.5 K/uL 4.1  6.7  8.4   RBC 4.22 - 5.81 MIL/uL 3.11  3.23  3.70   Hemoglobin 13.0 - 17.0 g/dL 8.9  9.3  19.1   HCT 47.8 - 52.0 % 27.7  30.9  34.0   Platelets 150 - 400 K/uL 214  232  266   NEUT# 1.7 - 7.7 K/uL 2.2  4.2    Lymph# 0.7 - 4.0 K/uL 1.1  1.7      Neurologic: Patient does not have protective sensation bilateral lower extremities.   MUSCULOSKELETAL:   Skin: Examination patient does not have pain to palpation around the left hip there is no warmth or redness.  Patient does have warmth and redness around the left knee which  is tender to palpation.  Palpation of the left knee reproduces his symptoms.  Review of the MRI scan does show chronic osteomyelitis of the acetabulum and femoral head.  There is a fluid collection anteriorly to the hip with the appearance of an abscess.  The MRI scan that extends down to the knee shows a mild effusion without any bone abnormalities no evidence of osteomyelitis of the knee.  White cell count  4.1 with hemoglobin of 8.9.  White cell count was maximally elevated at 13.5 on April 30 and is slowly decreased to 4.1.  There is no elevated neutrophil count.  No active motor function of the left lower extremity.  Sed rate was 77 on April 30.  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  Plan: Plan: I agree with scanning the pelvis.  For the hip and knee I would continue IV antibiotics.  No indication for arthrotomy of the left knee at this time.  No indication for arthrotomy of the hip.    Could have radiology aspirate the anterior hip fluid collection to decompress and obtain cultures for targeted antibiotics.  Thank you for the consult and the opportunity to see Mr. Yeudiel Faine, MD [facility] [phone] 9:19 AM

## 2024-02-05 ENCOUNTER — Inpatient Hospital Stay (HOSPITAL_COMMUNITY): Payer: MEDICAID

## 2024-02-05 DIAGNOSIS — L03116 Cellulitis of left lower limb: Secondary | ICD-10-CM | POA: Diagnosis not present

## 2024-02-05 DIAGNOSIS — I38 Endocarditis, valve unspecified: Secondary | ICD-10-CM

## 2024-02-05 LAB — HEPATITIS B SURFACE ANTIGEN: Hepatitis B Surface Ag: NONREACTIVE

## 2024-02-05 LAB — CBC WITH DIFFERENTIAL/PLATELET
Abs Immature Granulocytes: 0.02 10*3/uL (ref 0.00–0.07)
Basophils Absolute: 0 10*3/uL (ref 0.0–0.1)
Basophils Relative: 1 %
Eosinophils Absolute: 0.2 10*3/uL (ref 0.0–0.5)
Eosinophils Relative: 4 %
HCT: 28.2 % — ABNORMAL LOW (ref 39.0–52.0)
Hemoglobin: 9.3 g/dL — ABNORMAL LOW (ref 13.0–17.0)
Immature Granulocytes: 0 %
Lymphocytes Relative: 26 %
Lymphs Abs: 1.2 10*3/uL (ref 0.7–4.0)
MCH: 29.3 pg (ref 26.0–34.0)
MCHC: 33 g/dL (ref 30.0–36.0)
MCV: 89 fL (ref 80.0–100.0)
Monocytes Absolute: 0.6 10*3/uL (ref 0.1–1.0)
Monocytes Relative: 13 %
Neutro Abs: 2.5 10*3/uL (ref 1.7–7.7)
Neutrophils Relative %: 56 %
Platelets: 241 10*3/uL (ref 150–400)
RBC: 3.17 MIL/uL — ABNORMAL LOW (ref 4.22–5.81)
RDW: 14.2 % (ref 11.5–15.5)
WBC: 4.5 10*3/uL (ref 4.0–10.5)
nRBC: 0 % (ref 0.0–0.2)

## 2024-02-05 LAB — COMPREHENSIVE METABOLIC PANEL WITH GFR
ALT: 60 U/L — ABNORMAL HIGH (ref 0–44)
AST: 119 U/L — ABNORMAL HIGH (ref 15–41)
Albumin: 1.8 g/dL — ABNORMAL LOW (ref 3.5–5.0)
Alkaline Phosphatase: 92 U/L (ref 38–126)
Anion gap: 8 (ref 5–15)
BUN: <5 mg/dL — ABNORMAL LOW (ref 6–20)
CO2: 26 mmol/L (ref 22–32)
Calcium: 7.7 mg/dL — ABNORMAL LOW (ref 8.9–10.3)
Chloride: 101 mmol/L (ref 98–111)
Creatinine, Ser: 0.51 mg/dL — ABNORMAL LOW (ref 0.61–1.24)
GFR, Estimated: >60 mL/min (ref >60)
Glucose, Bld: 103 mg/dL — ABNORMAL HIGH (ref 70–99)
Potassium: 3.2 mmol/L — ABNORMAL LOW (ref 3.5–5.1)
Sodium: 135 mmol/L (ref 135–145)
Total Bilirubin: 0.7 mg/dL (ref 0.0–1.2)
Total Protein: 5.7 g/dL — ABNORMAL LOW (ref 6.5–8.1)

## 2024-02-05 LAB — ECHOCARDIOGRAM COMPLETE
AR max vel: 3.42 cm2
AV Area VTI: 3.62 cm2
AV Area mean vel: 3.76 cm2
AV Mean grad: 3 mmHg
AV Peak grad: 8.3 mmHg
Ao pk vel: 1.44 m/s
Area-P 1/2: 4.21 cm2
Calc EF: 66.8 %
S' Lateral: 3.6 cm
Single Plane A2C EF: 73.8 %
Single Plane A4C EF: 56 %
Weight: 2088.2 [oz_av]

## 2024-02-05 LAB — CULTURE, BLOOD (ROUTINE X 2): Culture: NO GROWTH

## 2024-02-05 LAB — PROCALCITONIN: Procalcitonin: 0.27 ng/mL

## 2024-02-05 LAB — HIV ANTIBODY (ROUTINE TESTING W REFLEX): HIV Screen 4th Generation wRfx: NONREACTIVE

## 2024-02-05 LAB — HEPATITIS B SURFACE ANTIBODY,QUALITATIVE: Hep B S Ab: REACTIVE — AB

## 2024-02-05 LAB — HEPATITIS A ANTIBODY, TOTAL: hep A Total Ab: NONREACTIVE

## 2024-02-05 LAB — C-REACTIVE PROTEIN: CRP: 8.5 mg/dL — ABNORMAL HIGH (ref <1.0)

## 2024-02-05 LAB — MAGNESIUM: Magnesium: 1.6 mg/dL — ABNORMAL LOW (ref 1.7–2.4)

## 2024-02-05 MED ORDER — LIDOCAINE HCL (PF) 1 % IJ SOLN
10.0000 mL | Freq: Once | INTRAMUSCULAR | Status: AC
  Administered 2024-02-05: 10 mL via INTRADERMAL

## 2024-02-05 MED ORDER — POTASSIUM CHLORIDE 10 MEQ/100ML IV SOLN
INTRAVENOUS | Status: AC
  Filled 2024-02-05: qty 100

## 2024-02-05 MED ORDER — POLYETHYLENE GLYCOL 3350 17 G PO PACK
17.0000 g | PACK | Freq: Two times a day (BID) | ORAL | Status: DC
  Administered 2024-02-05 – 2024-02-11 (×3): 17 g via ORAL
  Filled 2024-02-05 (×20): qty 1

## 2024-02-05 MED ORDER — MAGNESIUM SULFATE 2 GM/50ML IV SOLN
2.0000 g | Freq: Once | INTRAVENOUS | Status: AC
  Administered 2024-02-05: 2 g via INTRAVENOUS
  Filled 2024-02-05: qty 50

## 2024-02-05 MED ORDER — MAGNESIUM HYDROXIDE 400 MG/5ML PO SUSP
30.0000 mL | Freq: Two times a day (BID) | ORAL | Status: AC
  Administered 2024-02-05 (×2): 30 mL via ORAL
  Filled 2024-02-05 (×2): qty 30

## 2024-02-05 MED ORDER — DOCUSATE SODIUM 100 MG PO CAPS
200.0000 mg | ORAL_CAPSULE | Freq: Two times a day (BID) | ORAL | Status: DC
  Administered 2024-02-05 – 2024-02-14 (×5): 200 mg via ORAL
  Filled 2024-02-05 (×21): qty 2

## 2024-02-05 MED ORDER — POTASSIUM CHLORIDE 10 MEQ/100ML IV SOLN
10.0000 meq | INTRAVENOUS | Status: AC
  Administered 2024-02-05 (×4): 10 meq via INTRAVENOUS
  Filled 2024-02-05 (×3): qty 100

## 2024-02-05 MED ORDER — LACTATED RINGERS IV SOLN: INTRAVENOUS | Status: AC

## 2024-02-05 NOTE — TOC CM/SW Note (Signed)
 Transition of Care [facility]) - Inpatient Brief Assessment   Patient Details  Name: Mark Hammond MRN: [id number] Date of Birth: [birth year]  Transition of Care [facility]) CM/SW Contact:    Jannice Mends, LCSW Phone Number: 02/05/2024, 12:55 PM   Clinical Narrative: Patient transferred to 5W with history of homelessness and substance use. Resources provided per prior TOC SW. SNF recommended, however many barriers are in place including lack of Disability for payment and patient will remain in the hospital for treatment.     Transition of Care Asessment: Insurance and Status: Insurance coverage has been reviewed Patient has primary care physician: Yes Home environment has been reviewed: home Prior level of function:: independent Prior/Current Home Services: No current home services Social Drivers of Health Review: SDOH reviewed no interventions necessary Readmission risk has been reviewed: Yes Transition of care needs: transition of care needs identified, TOC will continue to follow

## 2024-02-05 NOTE — Progress Notes (Signed)
 PROGRESS NOTE                                                                                                                                                                                                             Patient Demographics:    Mark Hammond, is a 33 y.o. male, DOB - 1991-04-13, ONG:[id number]  Outpatient Primary MD for the patient is Patient, No Pcp Per    LOS - 5  Admit date - 01/31/2024    Chief Complaint  Patient presents with   Leg Swelling   Generalized Pain       Brief Narrative (HPI from H&P)   33 y.o. male with PMH significant for polysubstance abuse including alcohol, tobacco, cocaine , heroin, MDMA, marijuana, bipolar disorder, schizoaffective schizophrenia, 4/30 patient presented to ED with complaint of left foot pain, swelling, warmth.  Further workup suggestive of diffuse septic arthritis of the left hip, proximal femur along with left hip and possible pelvic abscess.  He is currently septic.  Transferred to my care on 02/04/2024.    Subjective:   Patient in bed awake and feels better, no headache or chest pain, left still has left thigh pain and pain around his left knee, no fevers last night.  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  Currently on Vancomycin  and Rocephin , orthopedics, PCCM and ID following, sepsis pathophysiology considerably improved, CT pelvis done Case discussed with Dr. Julio Ohm, patient has a left thigh soft tissue infection, knee per Dr. Julio Ohm looks stable, IR will try and drain left thigh abscess on 02/05/2024.  Continue gentle IV fluids, follow cultures and monitor clinically.  Echocardiogram pending.  HX of polysubstance abuse including ongoing alcohol abuse, ongoing cocaine , amphetamine, cannabis abuse.  Counseled to abstain from all.  On CIWA protocol.   Hypomagnesemia and hypokalemia.  Replaced.    Anemia of  chronic disease.  Monitor.  Type screen and check INR.       Condition - Extremely Guarded  Family Communication  :  None present  Code Status :  Full  Consults  :  Ortho, ID, PCCM, IR  PUD Prophylaxis :  PPI   Procedures  :     CT pelvis -  Left proximal thigh cellulitis with an intramuscular  abscess  MRI - 1. Findings are highly suspicious for septic arthritis and osteomyelitis of the left hip and proximal left femur. The proximal extent of the pelvic involvement is not imaged. The sacroiliac joints are not included. 2. Diffuse edema and enhancement throughout the proximal left thigh musculature with multiple irregular peripherally enhancing fluid collections anteriorly in the proximal left thigh, largest superficial and lateral to the proximal rectus femoris muscle, suspicious for abscesses. 3. Incomplete visualization of the proximal extension of inflammation into the pelvis along the iliopsoas and obturator internus muscles. 4. Asymmetric subcutaneous edema within the visualized left pelvis and left thigh. 5. Recommend further evaluation of the pelvis (with CT or MRI) to better evaluate the proximal extent of the infection.      Disposition Plan  :    Status is: Inpatient   DVT Prophylaxis  :    heparin injection 5,000 Units Start: 02/04/24 2000 SCDs Start: 01/31/24 2147  Lab Results  Component Value Date   PLT 241 02/05/2024    Diet :  Diet Order             DIET SOFT Fluid consistency: Thin  Diet effective now                    Inpatient Medications  Scheduled Meds:  ferrous sulfate   325 mg Oral Q breakfast   folic acid   1 mg Oral Daily   heparin injection (subcutaneous)  5,000 Units Subcutaneous Q8H   multivitamin with minerals  1 tablet Oral Daily   pantoprazole  40 mg Oral Daily   QUEtiapine   100 mg Oral QHS   thiamine  (VITAMIN B1) injection  100 mg Intravenous Daily   Continuous Infusions:  cefTRIAXone  (ROCEPHIN )  IV 2 g (02/04/24 2059)    lactated ringers      magnesium  sulfate bolus IVPB     metronidazole 500 mg (02/05/24 0013)   potassium chloride  10 mEq (02/05/24 0758)   vancomycin  1,000 mg (02/04/24 2225)   PRN Meds:.acetaminophen  **OR** acetaminophen , lip balm, melatonin, morphine  injection, naLOXone  (NARCAN )  injection, nicotine , ondansetron  (ZOFRAN ) IV, pneumococcal 20-valent conjugate vaccine  Antibiotics  :    Anti-infectives (From admission, onward)    Start     Dose/Rate Route Frequency Ordered Stop   02/04/24 2215  metroNIDAZOLE (FLAGYL) IVPB 500 mg        500 mg 100 mL/hr over 60 Minutes Intravenous 2 times daily 02/04/24 2118     02/04/24 2000  piperacillin-tazobactam (ZOSYN) IVPB 3.375 g  Status:  Discontinued        3.375 g 12.5 mL/hr over 240 Minutes Intravenous Every 8 hours 02/04/24 1142 02/04/24 1225   02/04/24 2000  cefTRIAXone  (ROCEPHIN ) 2 g in sodium chloride  0.9 % 100 mL IVPB        2 g 200 mL/hr over 30 Minutes Intravenous Every 24 hours 02/04/24 1225     02/04/24 1315  metroNIDAZOLE (FLAGYL) tablet 500 mg  Status:  Discontinued        500 mg Oral Every 12 hours 02/04/24 1225 02/04/24 2118   02/04/24 0915  piperacillin-tazobactam (ZOSYN) IVPB 3.375 g  Status:  Discontinued        3.375 g 12.5 mL/hr over 240 Minutes Intravenous Every 8 hours 02/04/24 0826 02/04/24 1142   02/01/24 1000  vancomycin  (VANCOCIN ) IVPB 1000 mg/200 mL premix        1,000 mg 200 mL/hr over 60 Minutes Intravenous Every 12 hours 01/31/24 2208     02/01/24 0400  cefTRIAXone  (ROCEPHIN ) 1 g in sodium chloride  0.9 % 100 mL IVPB  Status:  Discontinued        1 g 200 mL/hr over 30 Minutes Intravenous Every 24 hours 01/31/24 2151 02/04/24 0824   01/31/24 1945  vancomycin  (VANCOCIN ) IVPB 1000 mg/200 mL premix        1,000 mg 200 mL/hr over 60 Minutes Intravenous  Once 01/31/24 1940 01/31/24 2346   01/31/24 1945  ceFEPIme  (MAXIPIME ) 2 g in sodium chloride  0.9 % 100 mL IVPB        2 g 200 mL/hr over 30 Minutes Intravenous  Once  01/31/24 1940 01/31/24 2042         Objective:   Vitals:   02/05/24 0440 02/05/24 0445 02/05/24 0447 02/05/24 0753  BP:  99/64 (!) 108/58 104/64  Pulse:  81  88  Resp:  19 17 17   Temp:  98.4 F (36.9 C)  98.2 F (36.8 C)  TempSrc:  Oral    SpO2:  98%  100%  Weight: 59.2 kg       Wt Readings from Last 3 Encounters:  02/05/24 59.2 kg  01/26/24 56.7 kg  01/26/24 56.7 kg     Intake/Output Summary (Last 24 hours) at 02/05/2024 0917 Last data filed at 02/05/2024 0440 Gross per 24 hour  Intake 1400 ml  Output 2600 ml  Net -1200 ml     Physical Exam  Awake- alert, no focal deficits, left knee and thigh red swollen and warm [place].AT,PERRAL Supple Neck, No JVD,   Symmetrical Chest wall movement, Good air movement bilaterally, CTAB RRR,No Gallops,Rubs or new Murmurs,  +ve B.Sounds, Abd Soft, No tenderness,        Data Review:    Recent Labs  Lab 01/31/24 1934 02/01/24 0442 02/02/24 0313 02/03/24 0324 02/04/24 0632 02/05/24 0019  WBC 13.5* 8.1 8.4 6.7 4.1 4.5  HGB 11.0* 9.8* 10.8* 9.3* 8.9* 9.3*  HCT 32.9* 30.4* 34.0* 30.9* 27.7* 28.2*  PLT 497* 304 266 232 214 241  MCV 88.2 91.3 91.9 95.7 89.1 89.0  MCH 29.5 29.4 29.2 28.8 28.6 29.3  MCHC 33.4 32.2 31.8 30.1 32.1 33.0  RDW 15.1 15.0 14.9 14.8 14.6 14.2  LYMPHSABS 1.8 0.8  --  1.7 1.1 1.2  MONOABS 1.2* 0.7  --  0.7 0.6 0.6  EOSABS 0.0 0.0  --  0.1 0.1 0.2  BASOSABS 0.1 0.0  --  0.1 0.0 0.0    Recent Labs  Lab 01/31/24 1935 01/31/24 1937 01/31/24 2116 01/31/24 2154 02/01/24 0442 02/02/24 0313 02/03/24 0324 02/04/24 0632 02/04/24 0916 02/04/24 1147 02/05/24 0019  NA  --   --   --   --  129* 133* 135 133*  --   --  135  K  --   --   --   --  3.3* 3.4* 3.7 3.2*  --   --  3.2*  CL  --   --   --   --  96* 97* 100 97*  --   --  101  CO2  --   --   --   --  23 27 24 26   --   --  26  ANIONGAP  --   --   --   --  10 9 11 10   --   --  8  GLUCOSE  --   --   --   --  237* 103* 99 109*  --   --  103*  BUN  --    --   --   --  6 <5* 6 <5*  --   --  <5*  CREATININE  --   --   --   --  0.47* 0.42* 0.56* 0.55*  --   --  0.51*  AST  --   --   --   --  77* 87* 68* 143*  --   --  119*  ALT  --   --   --   --  45* 47* 40 61*  --   --  60*  ALKPHOS  --   --   --   --  101 107 86 99  --   --  92  BILITOT  --   --   --   --  1.2 1.2 0.8 0.8  --   --  0.7  ALBUMIN  --   --   --   --  2.2* 2.4* 2.1* 1.9*  --   --  1.8*  CRP 21.0*  --   --   --   --   --   --  10.6*  --   --  8.5*  PROCALCITON  --   --   --   --   --   --   --  0.27  --   --  0.27  LATICACIDVEN  --  2.8* 1.1  --   --   --   --   --  1.1 1.2  --   INR  --   --   --   --  1.4*  --   --  1.2  --   --   --   MG  --   --   --    < > 1.6* 1.5* 1.7 1.6*  --   --  1.6*  PHOS  --   --   --   --  2.4* 2.8 3.9 3.4  --   --   --   CALCIUM  --   --   --   --  7.4* 8.0* 8.1* 7.7*  --   --  7.7*   < > = values in this interval not displayed.      Recent Labs  Lab 01/31/24 1935 01/31/24 1937 01/31/24 2116 01/31/24 2154 02/01/24 0442 02/02/24 0313 02/03/24 0324 02/04/24 1610 02/04/24 0916 02/04/24 1147 02/05/24 0019  CRP 21.0*  --   --   --   --   --   --  10.6*  --   --  8.5*  PROCALCITON  --   --   --   --   --   --   --  0.27  --   --  0.27  LATICACIDVEN  --  2.8* 1.1  --   --   --   --   --  1.1 1.2  --   INR  --   --   --   --  1.4*  --   --  1.2  --   --   --   MG  --   --   --    < > 1.6* 1.5* 1.7 1.6*  --   --  1.6*  CALCIUM  --   --   --   --  7.4* 8.0* 8.1* 7.7*  --   --  7.7*   < > = values in this interval not displayed.    --------------------------------------------------------------------------------------------------------------- Lab Results  Component Value Date   CHOL 96 12/08/2023   HDL 34 (L) 12/08/2023  LDLCALC 51 12/08/2023   TRIG 55 12/08/2023   CHOLHDL 2.8 12/08/2023    Lab Results  Component Value Date   HGBA1C 6.0 (H) 12/08/2023   No results for input(s): "TSH", "T4TOTAL", "FREET4", "T3FREE", "THYROIDAB"  in the last 72 hours. No results for input(s): "VITAMINB12", "FOLATE", "FERRITIN", "TIBC", "IRON", "RETICCTPCT" in the last 72 hours. ------------------------------------------------------------------------------------------------------------------ Cardiac Enzymes No results for input(s): "CKMB", "TROPONINI", "MYOGLOBIN" in the last 168 hours.  Invalid input(s): "CK"  Micro Results Recent Results (from the past 240 hours)  Resp panel by RT-PCR (RSV, Flu A&B, Covid) Anterior Nasal Swab     Status: None   Collection Time: 01/26/24 11:40 PM   Specimen: Anterior Nasal Swab  Result Value Ref Range Status   SARS Coronavirus 2 by RT PCR NEGATIVE NEGATIVE Final   Influenza A by PCR NEGATIVE NEGATIVE Final   Influenza B by PCR NEGATIVE NEGATIVE Final    Comment: (NOTE) The Xpert Xpress SARS-CoV-2/FLU/RSV plus assay is intended as an aid in the diagnosis of influenza from Nasopharyngeal swab specimens and should not be used as a sole basis for treatment. Nasal washings and aspirates are unacceptable for Xpert Xpress SARS-CoV-2/FLU/RSV testing.  Fact Sheet for Patients: BloggerCourse.com  Fact Sheet for Healthcare Providers: SeriousBroker.it  This test is not yet approved or cleared by the [place]  FDA and has been authorized for detection and/or diagnosis of SARS-CoV-2 by FDA under an Emergency Use Authorization (EUA). This EUA will remain in effect (meaning this test can be used) for the duration of the COVID-19 declaration under Section 564(b)(1) of the Act, 21 U.S.C. section 360bbb-3(b)(1), unless the authorization is terminated or revoked.     Resp Syncytial Virus by PCR NEGATIVE NEGATIVE Final    Comment: (NOTE) Fact Sheet for Patients: BloggerCourse.com  Fact Sheet for Healthcare Providers: SeriousBroker.it  This test is not yet approved or cleared by the [place]  FDA  and has been authorized for detection and/or diagnosis of SARS-CoV-2 by FDA under an Emergency Use Authorization (EUA). This EUA will remain in effect (meaning this test can be used) for the duration of the COVID-19 declaration under Section 564(b)(1) of the Act, 21 U.S.C. section 360bbb-3(b)(1), unless the authorization is terminated or revoked.  Performed at [facility], [address] [place], [place] [address]   Blood culture (routine x 2)     Status: None   Collection Time: 01/31/24  8:00 PM   Specimen: BLOOD  Result Value Ref Range Status   Specimen Description   Final    BLOOD RIGHT ANTECUBITAL Performed at [facility], [address] [place], [place] [address]    Special Requests   Final    BOTTLES DRAWN AEROBIC AND ANAEROBIC Blood Culture results may not be optimal due to an inadequate volume of blood received in culture bottles Performed at [facility], [address] [place], [place] [address]    Culture   Final    NO GROWTH 5 DAYS Performed at [facility], [address] [place], [place] [address]    Report Status 02/05/2024 FINAL  Final  Blood culture (routine x 2)     Status: None (Preliminary result)   Collection Time: 02/01/24  6:40 PM   Specimen: BLOOD LEFT ARM  Result Value Ref Range Status   Specimen Description   Final    BLOOD LEFT ARM Performed at [facility], [address], [place], [place] [address]    Special Requests  Final    BOTTLES DRAWN AEROBIC AND ANAEROBIC Blood Culture adequate volume Performed at [facility], [address], [place], [place] [address]    Culture   Final    NO GROWTH 4 DAYS Performed at [facility], [address], [place], [place] [address]    Report Status PENDING  Incomplete  MRSA Next Gen by PCR, Nasal     Status: None   Collection Time: 02/01/24  7:05 PM   Specimen: Nasal Mucosa; Nasal Swab  Result Value Ref Range Status    MRSA by PCR Next Gen NOT DETECTED NOT DETECTED Final    Comment: (NOTE) The GeneXpert MRSA Assay (FDA approved for NASAL specimens only), is one component of a comprehensive MRSA colonization surveillance program. It is not intended to diagnose MRSA infection nor to guide or monitor treatment for MRSA infections. Test performance is not FDA approved in patients less than 19 years old. Performed at [facility], [address] [place], [place] [address]     Radiology Report DG Chest Port 1 View Result Date: 02/04/2024 CLINICAL DATA:  Shortness of breath. EXAM: PORTABLE CHEST 1 VIEW COMPARISON:  Chest radiograph dated 02/01/2024. FINDINGS: No focal consolidation, pleural effusion or pneumothorax. The cardiac silhouette is within normal limits. No acute osseous pathology. IMPRESSION: No active disease. Electronically Signed   By: Angus Bark M.D.   On: 02/04/2024 13:40   CT PELVIS W CONTRAST Result Date: 02/04/2024 CLINICAL DATA:  Leg swelling and pain. EXAM: CT PELVIS WITH CONTRAST TECHNIQUE: Multidetector CT imaging of the pelvis was performed using the standard protocol following the bolus administration of intravenous contrast. RADIATION DOSE REDUCTION: This exam was performed according to the departmental dose-optimization program which includes automated exposure control, adjustment of the mA and/or kV according to patient size and/or use of iterative reconstruction technique. CONTRAST:  75mL OMNIPAQUE  IOHEXOL  350 MG/ML SOLN COMPARISON:  05/28/2023. FINDINGS: Urinary Tract:  No abnormality visualized. Bowel: Unremarkable visualized pelvic bowel loops. Normal appendix. Vascular/Lymphatic: No pathologically enlarged lymph nodes. No significant vascular abnormality seen. Reproductive:  Unremarkable prostate. Other: Left proximal thigh diffuse subcutaneous edema and skin thickening consistent with cellulitis. No subcutaneous air to indicate necrotizing fasciitis and no  evidence of Ludwig's angina. Musculoskeletal: Intramuscular rim enhancing fluid collection consistent with a 4 cm abscess in the proximal left thigh. IMPRESSION: Left proximal thigh cellulitis with an intramuscular abscess. Electronically Signed   By: Sydell Eva M.D.   On: 02/04/2024 10:14   MR FEMUR LEFT W WO CONTRAST Result Date: 02/03/2024 CLINICAL DATA:  Acute limping.  Thigh pain.  Infection suspected. EXAM: MR OF THE LEFT LOWER EXTREMITY WITHOUT AND WITH CONTRAST TECHNIQUE: Multiplanar, multisequence MR imaging of the left thigh was performed both before and after administration of intravenous contrast. CONTRAST:  5mL GADAVIST  GADOBUTROL  1 MMOL/ML IV SOLN COMPARISON:  None recent. Left femur radiographs 11/27/2023. Pelvic CT 02/11/2022. FINDINGS: Bones/Joint/Cartilage The proximal left femur and left acetabulum are grossly abnormal and appeared normal on the radiographs performed 11/27/2023. There is diffuse replacement of the normal T1 marrow signal within the acetabulum, left femoral head and neck with cortical destruction and superior displacement of the femoral head. There is associated diffusely increased T2 marrow signal and enhancement following contrast. Marrow changes extend into the proximal left femoral diaphysis. There is a moderate size irregular hip joint effusion with synovial enhancement following contrast. Findings are highly suspicious for septic arthritis and osteomyelitis. The distal left femur appears unremarkable. The right hip  and visualized right femur appear normal. The symphysis pubis is intact. The sacroiliac joints are incompletely visualized. Ligaments No significant ligamentous abnormalities identified. Muscles and Tendons Diffuse edema and enhancement throughout the proximal thigh musculature, including the iliacus and obturator internus muscles. There are multiple irregular peripherally enhancing fluid collections anteriorly in the proximal left thigh, largest  superficial and lateral to the proximal rectus femoris muscle, measuring 3.3 x 2.3 x 4.1 cm. There are other smaller fluid collections anterior to the left hip and within the gluteus medius and minimus muscles. Edema extends into the mid to distal left thigh musculature without associated fluid collection distally. The quadriceps tendon is intact. Soft tissues Asymmetric subcutaneous edema within the visualized left pelvis and left thigh. Mild subcutaneous enhancement lateral to the left thigh. Incomplete visualization of the proximal extent of inflammation into the pelvis along the iliopsoas and obturator internus muscles. IMPRESSION: 1. Findings are highly suspicious for septic arthritis and osteomyelitis of the left hip and proximal left femur. The proximal extent of the pelvic involvement is not imaged. The sacroiliac joints are not included. 2. Diffuse edema and enhancement throughout the proximal left thigh musculature with multiple irregular peripherally enhancing fluid collections anteriorly in the proximal left thigh, largest superficial and lateral to the proximal rectus femoris muscle, suspicious for abscesses. 3. Incomplete visualization of the proximal extension of inflammation into the pelvis along the iliopsoas and obturator internus muscles. 4. Asymmetric subcutaneous edema within the visualized left pelvis and left thigh. 5. Recommend further evaluation of the pelvis (with CT or MRI) to better evaluate the proximal extent of the infection. 6. These results will be called to the ordering clinician or representative by the Radiologist Assistant, and communication documented in the PACS or [employer]. Electronically Signed   By: Elmon Hagedorn M.D.   On: 02/03/2024 13:17     Signature  -   Lynnwood Sauer M.D on 02/05/2024 at 9:17 AM   -  To page go to [email or web address]

## 2024-02-05 NOTE — Progress Notes (Signed)
 MEDICATION-RELATED CONSULT NOTE   IR Procedure Consult - Anticoagulant/Antiplatelet PTA/Inpatient Med List Review by Pharmacist    Procedure: Left thigh aspiration    Completed: ~09:20am  Post-Procedural bleeding risk per IR MD assessment:  low  Antithrombotic medications on inpatient or PTA profile prior to procedure:    - heparin 5000 units SQ q8hrs; last dose given 0437 on 5/5 am   Recommended restart time per IR Post-Procedure Guidelines:   - at least 4 hours after procedure, or at next standard dose time  Other considerations:   no complications noted  Plan:     Resume Heparin 5000 units SQ q8hrs at 2pm today.  Adolphus Akin, RPh 02/05/2024 10:04 AM

## 2024-02-05 NOTE — Plan of Care (Addendum)

## 2024-02-05 NOTE — Progress Notes (Signed)
 Patient Status: [name] - In-pt  Assessment and Plan: Left thigh fluid collection Patient with left thigh soft tissue collection concerning for abscess.  IR consulted for aspiration.  Case reviewed and approved by Dr. Jinx Mourning with local anesthestic.   Risks and benefits of aspiration was discussed with the patient and/or patient's family including, but not limited to bleeding, infection, damage to adjacent structures or low yield requiring additional tests.  All of the questions were answered and there is agreement to proceed.  Consent signed and in chart.  ______________________________________________________________________   History of Present Illness: Mark Hammond is a 33 y.o. male with history of bipolar disorder, polysubstance abuse, paresthesias found to have left femur septic arthritis/osteomyelitis with left anterior thigh abscess.  IR consulted for aspiration.   Allergies and medications reviewed.   Review of Systems: A 12 point ROS discussed and pertinent positives are indicated in the HPI above.  All other systems are negative.  Review of Systems  Constitutional:  Negative for fatigue and fever.  Respiratory:  Negative for cough and shortness of breath.   Cardiovascular:  Negative for chest pain.  Gastrointestinal:  Negative for abdominal pain, nausea and vomiting.  Musculoskeletal:  Negative for back pain.  Psychiatric/Behavioral:  Negative for behavioral problems and confusion.     Vital Signs: BP 104/64   Pulse 88   Temp 98.2 F (36.8 C)   Resp 17   Wt 130 lb 8.2 oz (59.2 kg)   SpO2 100%   BMI 23.12 kg/m   Physical Exam Vitals and nursing note reviewed.  Constitutional:      General: He is not in acute distress.    Appearance: Normal appearance. He is not ill-appearing.  HENT:     Mouth/Throat:     Mouth: Mucous membranes are moist.     Pharynx: Oropharynx is clear.  Cardiovascular:     Rate and Rhythm: Normal rate and regular rhythm.   Pulmonary:     Effort: Pulmonary effort is normal. No respiratory distress.     Breath sounds: Normal breath sounds.  Abdominal:     General: Abdomen is flat. There is no distension.     Palpations: Abdomen is soft.  Skin:    General: Skin is warm and dry.  Neurological:     General: No focal deficit present.     Mental Status: He is alert and oriented to person, place, and time. Mental status is at baseline.  Psychiatric:        Mood and Affect: Mood normal.        Behavior: Behavior normal.        Thought Content: Thought content normal.        Judgment: Judgment normal.      Imaging reviewed.   Labs:  COAGS: Recent Labs    02/01/24 0442 02/04/24 0632  INR 1.4* 1.2  APTT 41*  --     BMP: Recent Labs    02/02/24 0313 02/03/24 0324 02/04/24 0632 02/05/24 0019  NA 133* 135 133* 135  K 3.4* 3.7 3.2* 3.2*  CL 97* 100 97* 101  CO2 27 24 26 26   GLUCOSE 103* 99 109* 103*  BUN <5* 6 <5* <5*  CALCIUM 8.0* 8.1* 7.7* 7.7*  CREATININE 0.42* 0.56* 0.55* 0.51*  GFRNONAA >60 >60 >60 >60       Electronically Signed: Lillian Rein, PA 02/05/2024, 8:43 AM   I spent a total of 15 minutes in face to face in clinical consultation, greater than  50% of which was counseling/coordinating care for thigh abscess, left.

## 2024-02-05 NOTE — Procedures (Signed)
 Procedure:  US   Left thigh aspiration  Blood Loss: Minimal  Findings: 18mL thick browninsh fluid obtained.  Complications:  None immediately apparent  Operator: Zettie Hillock, MD

## 2024-02-05 NOTE — Progress Notes (Signed)
*  PRELIMINARY RESULTS* Echocardiogram 2D Echocardiogram has been performed.  Mark Hammond 02/05/2024, 11:53 AM

## 2024-02-06 DIAGNOSIS — R531 Weakness: Secondary | ICD-10-CM

## 2024-02-06 DIAGNOSIS — M009 Pyogenic arthritis, unspecified: Secondary | ICD-10-CM | POA: Diagnosis not present

## 2024-02-06 DIAGNOSIS — K651 Peritoneal abscess: Secondary | ICD-10-CM | POA: Diagnosis not present

## 2024-02-06 DIAGNOSIS — L03116 Cellulitis of left lower limb: Secondary | ICD-10-CM | POA: Diagnosis not present

## 2024-02-06 DIAGNOSIS — M00852 Arthritis due to other bacteria, left hip: Secondary | ICD-10-CM | POA: Diagnosis not present

## 2024-02-06 DIAGNOSIS — F1910 Other psychoactive substance abuse, uncomplicated: Secondary | ICD-10-CM | POA: Diagnosis not present

## 2024-02-06 LAB — COMPREHENSIVE METABOLIC PANEL WITH GFR
ALT: 58 U/L — ABNORMAL HIGH (ref 0–44)
AST: 108 U/L — ABNORMAL HIGH (ref 15–41)
Albumin: 2 g/dL — ABNORMAL LOW (ref 3.5–5.0)
Alkaline Phosphatase: 110 U/L (ref 38–126)
Anion gap: 7 (ref 5–15)
BUN: <5 mg/dL — ABNORMAL LOW (ref 6–20)
CO2: 25 mmol/L (ref 22–32)
Calcium: 7.8 mg/dL — ABNORMAL LOW (ref 8.9–10.3)
Chloride: 104 mmol/L (ref 98–111)
Creatinine, Ser: 0.52 mg/dL — ABNORMAL LOW (ref 0.61–1.24)
GFR, Estimated: >60 mL/min (ref >60)
Glucose, Bld: 101 mg/dL — ABNORMAL HIGH (ref 70–99)
Potassium: 3.3 mmol/L — ABNORMAL LOW (ref 3.5–5.1)
Sodium: 136 mmol/L (ref 135–145)
Total Bilirubin: 0.6 mg/dL (ref 0.0–1.2)
Total Protein: 5.9 g/dL — ABNORMAL LOW (ref 6.5–8.1)

## 2024-02-06 LAB — MAGNESIUM: Magnesium: 1.7 mg/dL (ref 1.7–2.4)

## 2024-02-06 LAB — CULTURE, BLOOD (ROUTINE X 2)
Culture: NO GROWTH
Special Requests: ADEQUATE

## 2024-02-06 LAB — CBC WITH DIFFERENTIAL/PLATELET
Abs Immature Granulocytes: 0.02 10*3/uL (ref 0.00–0.07)
Basophils Absolute: 0 10*3/uL (ref 0.0–0.1)
Basophils Relative: 1 %
Eosinophils Absolute: 0.3 10*3/uL (ref 0.0–0.5)
Eosinophils Relative: 8 %
HCT: 29.3 % — ABNORMAL LOW (ref 39.0–52.0)
Hemoglobin: 9.6 g/dL — ABNORMAL LOW (ref 13.0–17.0)
Immature Granulocytes: 1 %
Lymphocytes Relative: 33 %
Lymphs Abs: 1.1 10*3/uL (ref 0.7–4.0)
MCH: 28.9 pg (ref 26.0–34.0)
MCHC: 32.8 g/dL (ref 30.0–36.0)
MCV: 88.3 fL (ref 80.0–100.0)
Monocytes Absolute: 0.4 10*3/uL (ref 0.1–1.0)
Monocytes Relative: 11 %
Neutro Abs: 1.6 10*3/uL — ABNORMAL LOW (ref 1.7–7.7)
Neutrophils Relative %: 46 %
Platelets: 259 10*3/uL (ref 150–400)
RBC: 3.32 MIL/uL — ABNORMAL LOW (ref 4.22–5.81)
RDW: 14.4 % (ref 11.5–15.5)
WBC: 3.5 10*3/uL — ABNORMAL LOW (ref 4.0–10.5)
nRBC: 0 % (ref 0.0–0.2)

## 2024-02-06 LAB — HCV RNA QUANT
HCV Quantitative Log: 6.778 {Log_IU}/mL (ref >1.70)
HCV Quantitative: 6000000 [IU]/mL (ref >50)

## 2024-02-06 LAB — VANCOMYCIN, TROUGH: Vancomycin Tr: 6 ug/mL — ABNORMAL LOW (ref 15–20)

## 2024-02-06 LAB — C-REACTIVE PROTEIN: CRP: 5 mg/dL — ABNORMAL HIGH (ref <1.0)

## 2024-02-06 LAB — HEPATITIS B CORE ANTIBODY, TOTAL: HEP B CORE AB: NEGATIVE

## 2024-02-06 LAB — PROCALCITONIN: Procalcitonin: 0.32 ng/mL

## 2024-02-06 LAB — VANCOMYCIN, PEAK
Vancomycin Pk: 36 ug/mL (ref 30–40)
Vancomycin Pk: 22 ug/mL — ABNORMAL LOW (ref 30–40)

## 2024-02-06 MED ORDER — VANCOMYCIN HCL 1500 MG/300ML IV SOLN
1500.0000 mg | Freq: Two times a day (BID) | INTRAVENOUS | Status: DC
  Filled 2024-02-06: qty 300

## 2024-02-06 MED ORDER — HALOPERIDOL LACTATE 5 MG/ML IJ SOLN: 2.0000 mg | Freq: Four times a day (QID) | INTRAMUSCULAR | Status: DC | PRN

## 2024-02-06 MED ORDER — METRONIDAZOLE 500 MG PO TABS
500.0000 mg | ORAL_TABLET | Freq: Two times a day (BID) | ORAL | Status: DC
  Administered 2024-02-06 – 2024-02-08 (×4): 500 mg via ORAL
  Filled 2024-02-06 (×4): qty 1

## 2024-02-06 MED ORDER — LORAZEPAM 2 MG/ML IJ SOLN
1.0000 mg | Freq: Two times a day (BID) | INTRAMUSCULAR | Status: DC | PRN
  Administered 2024-02-06 – 2024-02-10 (×5): 1 mg via INTRAVENOUS
  Filled 2024-02-06 (×5): qty 1

## 2024-02-06 MED ORDER — POTASSIUM CHLORIDE CRYS ER 20 MEQ PO TBCR
40.0000 meq | EXTENDED_RELEASE_TABLET | Freq: Once | ORAL | Status: AC
Start: 2024-02-06 — End: 2024-02-06
  Administered 2024-02-06: 40 meq via ORAL
  Filled 2024-02-06: qty 2

## 2024-02-06 MED ORDER — MAGNESIUM SULFATE 2 GM/50ML IV SOLN
2.0000 g | Freq: Once | INTRAVENOUS | Status: AC
  Administered 2024-02-06: 2 g via INTRAVENOUS
  Filled 2024-02-06: qty 50

## 2024-02-06 MED ORDER — VANCOMYCIN HCL IN DEXTROSE 1-5 GM/200ML-% IV SOLN
1000.0000 mg | Freq: Three times a day (TID) | INTRAVENOUS | Status: DC
  Administered 2024-02-06 – 2024-02-08 (×6): 1000 mg via INTRAVENOUS
  Filled 2024-02-06 (×6): qty 200

## 2024-02-06 NOTE — Progress Notes (Signed)
 [facility]  Date of Admission:  01/31/2024   Total days of inpatient antibiotics 6  Principal Problem:   Cellulitis of left lower extremity Active Problems:   Polysubstance abuse (HCC)   Severe sepsis (HCC)   Hypokalemia   Bipolar 1 disorder (HCC)   Chronic alcohol abuse   Anemia of chronic disease   Transaminitis          Assessment: 33 year old male with bipolar disorder, polysubstance abuse, paresthesia between left thigh and knee since July 2025 presented due to worsening left foot pain.  Started on vancomycin  and ceftriaxone .  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  Antibiotic management, consider IR engagement for aspiration of hip - CT pelvis showed proximal thigh cellulitis intramuscular abscesses. -Left foot with mild swelling, xray no acute abnormailities -On 5/25 patient underwent ultrasound-guided aspiration left anterior hip interventional radiology.  Noted the collection most consistent with abscess at 18 mL fluid was obtained.  Sent for cultures. Recommendations: -Continue vancomycin , ceftriaxone  and metronidazole -Follow aspirate cultures - Not a home IV antibiotic candidate given drug use.. Denies IVDA  #Hepatitis C antibody positive -Hepatitis C viral load pending - Hep B immune - HIV negative -Hepatitis B nonimmune, okay to vaccinate   Evaluation of this patient requires complex antimicrobial therapy evaluation and counseling + isolation needs for disease transmission risk assessment and mitigation   Microbiology:   Antibiotics: Metronidazole 5/4-present Vancomycin  ceftriaxone  4/30-present Cultures: Blood 4/30 no growth SUBJECTIVE: Resting in bed. NO new compalints Interval:  Afebrile overnight. Wbc 3.5k  Review of Systems: Review of Systems  All other systems reviewed and are negative.    Scheduled Meds:  docusate sodium  200 mg Oral BID   ferrous sulfate   325 mg Oral Q breakfast   folic acid   1 mg Oral Daily   heparin injection (subcutaneous)  5,000 Units Subcutaneous Q8H   metroNIDAZOLE  500 mg Oral Q12H   multivitamin with minerals  1 tablet Oral Daily   pantoprazole  40 mg Oral Daily   polyethylene glycol  17 g Oral BID   QUEtiapine   100 mg Oral QHS   thiamine  (VITAMIN B1) injection  100 mg Intravenous Daily   Continuous Infusions:  cefTRIAXone  (ROCEPHIN )  IV 2 g (02/05/24 2012)   vancomycin      PRN Meds:.acetaminophen  **OR** acetaminophen , lip balm, melatonin, morphine  injection, naLOXone  (NARCAN )  injection, nicotine , ondansetron  (ZOFRAN ) IV, pneumococcal 20-valent conjugate vaccine Allergies  Allergen Reactions   Codeine Other (See Comments)    Patient states parents told him he was allergic at a young age.   Fish Allergy Other (See Comments)    Patient does not wish to eat ANY fish- reaction unclear   Shellfish Allergy Other (See Comments)    Patient does not wish to eat ANY shellfish- reaction unclear    OBJECTIVE: Vitals:   02/06/24 0500 02/06/24 0738 02/06/24 0812 02/06/24 1340  BP:  (!) 95/56 101/60 103/62  Pulse:  81 66   Resp:  19 14 15   Temp:  98.1 F (36.7 C) 98 F (36.7 C)   TempSrc:   Axillary   SpO2:      Weight: 58.3 kg      Body mass index is 22.77 kg/m.  Physical Exam Constitutional:  General: He is not in acute distress.    Appearance: He is normal weight. He is not toxic-appearing.  HENT:     Head: Normocephalic and atraumatic.     Right Ear: External ear normal.     Left Ear: External ear normal.     Nose: No congestion or rhinorrhea.     Mouth/Throat:     Mouth: Mucous membranes are moist.     Pharynx: Oropharynx is clear.  Eyes:     Extraocular Movements: Extraocular movements intact.      Conjunctiva/sclera: Conjunctivae normal.     Pupils: Pupils are equal, round, and reactive to light.  Cardiovascular:     Rate and Rhythm: Normal rate and regular rhythm.     Heart sounds: No murmur heard.    No friction rub. No gallop.  Pulmonary:     Effort: Pulmonary effort is normal.     Breath sounds: Normal breath sounds.  Abdominal:     General: Abdomen is flat. Bowel sounds are normal.     Palpations: Abdomen is soft.  Musculoskeletal:        General: No swelling. Normal range of motion.     Cervical back: Normal range of motion and neck supple.  Skin:    General: Skin is warm and dry.  Neurological:     General: No focal deficit present.     Mental Status: He is oriented to person, place, and time.  Psychiatric:        Mood and Affect: Mood normal.       Lab Results Lab Results  Component Value Date   WBC 3.5 (L) 02/06/2024   HGB 9.6 (L) 02/06/2024   HCT 29.3 (L) 02/06/2024   MCV 88.3 02/06/2024   PLT 259 02/06/2024    Lab Results  Component Value Date   CREATININE 0.52 (L) 02/06/2024   BUN <5 (L) 02/06/2024   NA 136 02/06/2024   K 3.3 (L) 02/06/2024   CL 104 02/06/2024   CO2 25 02/06/2024    Lab Results  Component Value Date   ALT 58 (H) 02/06/2024   AST 108 (H) 02/06/2024   ALKPHOS 110 02/06/2024   BILITOT 0.6 02/06/2024        Orlie Bjornstad, MD [facility] 02/06/2024, 3:35 PM

## 2024-02-06 NOTE — Progress Notes (Addendum)
 Pharmacy Antibiotic Note  Mark Hammond is a 33 y.o. male admitted on 01/31/2024 with cellulitis.  MRI femur highly suspicious for septic arthritis and osteomyelitis of the left hip and proximal left femur. Also suspicious for multiple abscesses. Pharmacy has been consulted for vancomycin  and zosyn dosing.   Plan: Start piperacillin-tazobactam 3.375mg  IV Q8H Increase to vancomycin  1g IV Q8H (eAUC 505, Scr < 0.8) Follow renal function,cultures and clinical course  Weight: 58.3 kg (128 lb 8.5 oz)  Temp (24hrs), Avg:98.1 F (36.7 C), Min:98 F (36.7 C), Max:98.2 F (36.8 C)  Recent Labs  Lab 01/31/24 1937 01/31/24 2116 02/01/24 0442 02/02/24 0313 02/03/24 0324 02/04/24 8413 02/04/24 0916 02/04/24 1147 02/05/24 0019 02/06/24 0005 02/06/24 0227 02/06/24 0749 02/06/24 1038  WBC  --   --    < > 8.4 6.7 4.1  --   --  4.5  --   --  3.5*  --   CREATININE  --   --    < > 0.42* 0.56* 0.55*  --   --  0.51*  --   --  0.52*  --   LATICACIDVEN 2.8* 1.1  --   --   --   --  1.1 1.2  --   --   --   --   --   VANCOTROUGH  --   --   --   --   --   --   --   --   --   --   --   --  6*  VANCOPEAK  --   --   --   --   --   --   --   --   --  36 22*  --   --    < > = values in this interval not displayed.    Estimated Creatinine Clearance: 106.7 mL/min (A) (by C-G formula based on SCr of 0.52 mg/dL (L)).    Allergies  Allergen Reactions   Codeine Other (See Comments)    Patient states parents told him he was allergic at a young age.   Fish Allergy Other (See Comments)    Patient does not wish to eat ANY fish- reaction unclear   Shellfish Allergy Other (See Comments)    Patient does not wish to eat ANY shellfish- reaction unclear    Antimicrobials this admission: 4/30 cefepime  x 1 4/30 vancomycin  > 5/1 ceftriaxone  >5/4 5/4 Zosyn >  Dose adjustments this admission: 5/6 AUC 338, below goal range. On vancomycin  1 G IV q12 hours  Microbiology results: 4/30 BCx: ngtd4 5/1 BCx:  ngtd3  Mohammed Andrew, PharmD Clinical Pharmacist 02/06/2024 1:49 PM Please check AMION for all [facility] Pharmacy numbers

## 2024-02-06 NOTE — Progress Notes (Signed)
 PROGRESS NOTE                                                                                                                                                                                                             Patient Demographics:    Mark Hammond, is a 33 y.o. male, DOB - 15-Jun-1991, ZOX:[id number]  Outpatient Primary MD for the patient is Patient, No Pcp Per    LOS - 6  Admit date - 01/31/2024    Chief Complaint  Patient presents with   Leg Swelling   Generalized Pain       Brief Narrative (HPI from H&P)   33 y.o. male with PMH significant for polysubstance abuse including alcohol, tobacco, cocaine , heroin, MDMA, marijuana, bipolar disorder, schizoaffective schizophrenia, 4/30 patient presented to ED with complaint of left foot pain, swelling, warmth.  Further workup suggestive of diffuse septic arthritis of the left hip, proximal femur along with left hip and possible pelvic abscess.  He is currently septic.  Transferred to my care on 02/04/2024.    Subjective:   Seen in bed, denies any headache, no abdominal or chest pain, left leg pain improved but weakness persists, no shortness of breath.   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  Currently on Vancomycin  and Rocephin , orthopedics, PCCM and ID following, sepsis pathophysiology considerably improved, CT pelvis done Case discussed with Dr. Julio Ohm, patient has a left thigh soft tissue infection, knee per Dr. Julio Ohm looks stable, no event drainage of his left thigh abscess by IR on 02/05/2024 cultures pending, echo nonacute, continues to have left leg weakness despite pain improving will request neurology to evaluate as well.    HX of polysubstance abuse including ongoing alcohol abuse, ongoing cocaine , amphetamine, cannabis abuse.  Counseled to abstain from all.  On CIWA protocol.   Hypomagnesemia and hypokalemia.  Replaced.     Anemia of chronic disease.  Monitor.  Type screen and check INR.       Condition - Extremely Guarded  Family Communication  :  None present  Code Status :  Full  Consults  :  Ortho, ID, PCCM, IR, neurology  PUD Prophylaxis :  PPI   Procedures  :     Left anterior thigh abscess aspiration by IR on 02/05/2024.  Echo -  1. Left ventricular ejection fraction, by estimation, is 60 to 65%. The left ventricle has normal function. The left ventricle has no regional wall motion abnormalities. Left ventricular diastolic parameters were normal.   2. Right ventricular systolic function is normal. The right ventricular size is normal. Tricuspid regurgitation signal is inadequate for assessing PA pressure.   3. The mitral valve is grossly normal. No evidence of mitral valve regurgitation. No evidence of mitral stenosis.   4. The aortic valve was not well visualized. There is mild calcification of the aortic valve. There is mild thickening of the aortic valve. Aortic valve regurgitation is not visualized. No aortic stenosis is present.   5. The inferior vena cava is normal in size with greater than 50% respiratory variability, suggesting right atrial pressure of 3 mmHg.   CT pelvis -  Left proximal thigh cellulitis with an intramuscular abscess  MRI - 1. Findings are highly suspicious for septic arthritis and osteomyelitis of the left hip and proximal left femur. The proximal extent of the pelvic involvement is not imaged. The sacroiliac joints are not included. 2. Diffuse edema and enhancement throughout the proximal left thigh musculature with multiple irregular peripherally enhancing fluid collections anteriorly in the proximal left thigh, largest superficial and lateral to the proximal rectus femoris muscle, suspicious for abscesses. 3. Incomplete visualization of the proximal extension of inflammation into the pelvis along the iliopsoas and obturator internus muscles. 4. Asymmetric subcutaneous  edema within the visualized left pelvis and left thigh. 5. Recommend further evaluation of the pelvis (with CT or MRI) to better evaluate the proximal extent of the infection.      Disposition Plan  :    Status is: Inpatient   DVT Prophylaxis  :    heparin injection 5,000 Units Start: 02/04/24 2000 SCDs Start: 01/31/24 2147  Lab Results  Component Value Date   PLT 241 02/05/2024    Diet :  Diet Order             DIET SOFT Fluid consistency: Thin  Diet effective now                    Inpatient Medications  Scheduled Meds:  docusate sodium  200 mg Oral BID   ferrous sulfate   325 mg Oral Q breakfast   folic acid   1 mg Oral Daily   heparin injection (subcutaneous)  5,000 Units Subcutaneous Q8H   multivitamin with minerals  1 tablet Oral Daily   pantoprazole  40 mg Oral Daily   polyethylene glycol  17 g Oral BID   QUEtiapine   100 mg Oral QHS   thiamine  (VITAMIN B1) injection  100 mg Intravenous Daily   Continuous Infusions:  cefTRIAXone  (ROCEPHIN )  IV 2 g (02/05/24 2012)   lactated ringers  75 mL/hr at 02/06/24 0053   metronidazole 500 mg (02/05/24 2228)   vancomycin  1,000 mg (02/05/24 2340)   PRN Meds:.acetaminophen  **OR** acetaminophen , lip balm, melatonin, morphine  injection, naLOXone  (NARCAN )  injection, nicotine , ondansetron  (ZOFRAN ) IV, pneumococcal 20-valent conjugate vaccine  Antibiotics  :    Anti-infectives (From admission, onward)    Start     Dose/Rate Route Frequency Ordered Stop   02/04/24 2215  metroNIDAZOLE (FLAGYL) IVPB 500 mg        500 mg 100 mL/hr over 60 Minutes Intravenous 2 times daily 02/04/24 2118     02/04/24 2000  piperacillin-tazobactam (ZOSYN) IVPB 3.375 g  Status:  Discontinued        3.375  g 12.5 mL/hr over 240 Minutes Intravenous Every 8 hours 02/04/24 1142 02/04/24 1225   02/04/24 2000  cefTRIAXone  (ROCEPHIN ) 2 g in sodium chloride  0.9 % 100 mL IVPB        2 g 200 mL/hr over 30 Minutes Intravenous Every 24 hours 02/04/24  1225     02/04/24 1315  metroNIDAZOLE (FLAGYL) tablet 500 mg  Status:  Discontinued        500 mg Oral Every 12 hours 02/04/24 1225 02/04/24 2118   02/04/24 0915  piperacillin-tazobactam (ZOSYN) IVPB 3.375 g  Status:  Discontinued        3.375 g 12.5 mL/hr over 240 Minutes Intravenous Every 8 hours 02/04/24 0826 02/04/24 1142   02/01/24 1000  vancomycin  (VANCOCIN ) IVPB 1000 mg/200 mL premix        1,000 mg 200 mL/hr over 60 Minutes Intravenous Every 12 hours 01/31/24 2208     02/01/24 0400  cefTRIAXone  (ROCEPHIN ) 1 g in sodium chloride  0.9 % 100 mL IVPB  Status:  Discontinued        1 g 200 mL/hr over 30 Minutes Intravenous Every 24 hours 01/31/24 2151 02/04/24 0824   01/31/24 1945  vancomycin  (VANCOCIN ) IVPB 1000 mg/200 mL premix        1,000 mg 200 mL/hr over 60 Minutes Intravenous  Once 01/31/24 1940 01/31/24 2346   01/31/24 1945  ceFEPIme  (MAXIPIME ) 2 g in sodium chloride  0.9 % 100 mL IVPB        2 g 200 mL/hr over 30 Minutes Intravenous  Once 01/31/24 1940 01/31/24 2042         Objective:   Vitals:   02/05/24 1600 02/05/24 1941 02/06/24 0027 02/06/24 0500  BP: 112/73 122/81 (!) 100/53   Pulse: 78     Resp: (!) 22  18   Temp: 98.2 F (36.8 C)     TempSrc:  Oral Oral   SpO2:   100%   Weight:    58.3 kg    Wt Readings from Last 3 Encounters:  02/06/24 58.3 kg  01/26/24 56.7 kg  01/26/24 56.7 kg     Intake/Output Summary (Last 24 hours) at 02/06/2024 0737 Last data filed at 02/06/2024 0631 Gross per 24 hour  Intake 1073 ml  Output 4476 ml  Net -3403 ml     Physical Exam  Awake- alert, left knee and thigh laying and redness has improved, left hip and leg weakness as compared to the right [place].AT,PERRAL Supple Neck, No JVD,   Symmetrical Chest wall movement, Good air movement bilaterally, CTAB RRR,No Gallops,Rubs or new Murmurs,  +ve B.Sounds, Abd Soft, No tenderness,        Data Review:    Recent Labs  Lab 01/31/24 1934 02/01/24 0442 02/02/24 0313  02/03/24 0324 02/04/24 0632 02/05/24 0019  WBC 13.5* 8.1 8.4 6.7 4.1 4.5  HGB 11.0* 9.8* 10.8* 9.3* 8.9* 9.3*  HCT 32.9* 30.4* 34.0* 30.9* 27.7* 28.2*  PLT 497* 304 266 232 214 241  MCV 88.2 91.3 91.9 95.7 89.1 89.0  MCH 29.5 29.4 29.2 28.8 28.6 29.3  MCHC 33.4 32.2 31.8 30.1 32.1 33.0  RDW 15.1 15.0 14.9 14.8 14.6 14.2  LYMPHSABS 1.8 0.8  --  1.7 1.1 1.2  MONOABS 1.2* 0.7  --  0.7 0.6 0.6  EOSABS 0.0 0.0  --  0.1 0.1 0.2  BASOSABS 0.1 0.0  --  0.1 0.0 0.0    Recent Labs  Lab 01/31/24 1935 01/31/24 1937 01/31/24 2116 01/31/24 2154 02/01/24 4098  02/02/24 9562 02/03/24 0324 02/04/24 1308 02/04/24 0916 02/04/24 1147 02/05/24 0019 02/06/24 0418  NA  --   --   --   --  129* 133* 135 133*  --   --  135  --   K  --   --   --   --  3.3* 3.4* 3.7 3.2*  --   --  3.2*  --   CL  --   --   --   --  96* 97* 100 97*  --   --  101  --   CO2  --   --   --   --  23 27 24 26   --   --  26  --   ANIONGAP  --   --   --   --  10 9 11 10   --   --  8  --   GLUCOSE  --   --   --   --  237* 103* 99 109*  --   --  103*  --   BUN  --   --   --   --  6 <5* 6 <5*  --   --  <5*  --   CREATININE  --   --   --   --  0.47* 0.42* 0.56* 0.55*  --   --  0.51*  --   AST  --   --   --   --  77* 87* 68* 143*  --   --  119*  --   ALT  --   --   --   --  45* 47* 40 61*  --   --  60*  --   ALKPHOS  --   --   --   --  101 107 86 99  --   --  92  --   BILITOT  --   --   --   --  1.2 1.2 0.8 0.8  --   --  0.7  --   ALBUMIN  --   --   --   --  2.2* 2.4* 2.1* 1.9*  --   --  1.8*  --   CRP 21.0*  --   --   --   --   --   --  10.6*  --   --  8.5* 5.0*  PROCALCITON  --   --   --   --   --   --   --  0.27  --   --  0.27  --   LATICACIDVEN  --  2.8* 1.1  --   --   --   --   --  1.1 1.2  --   --   INR  --   --   --   --  1.4*  --   --  1.2  --   --   --   --   MG  --   --   --    < > 1.6* 1.5* 1.7 1.6*  --   --  1.6*  --   PHOS  --   --   --   --  2.4* 2.8 3.9 3.4  --   --   --   --   CALCIUM  --   --   --   --  7.4*  8.0* 8.1* 7.7*  --   --  7.7*  --    < > = values  in this interval not displayed.      Recent Labs  Lab 01/31/24 1935 01/31/24 1937 01/31/24 2116 01/31/24 2154 02/01/24 0442 02/02/24 0313 02/03/24 0324 02/04/24 1610 02/04/24 0916 02/04/24 1147 02/05/24 0019 02/06/24 0418  CRP 21.0*  --   --   --   --   --   --  10.6*  --   --  8.5* 5.0*  PROCALCITON  --   --   --   --   --   --   --  0.27  --   --  0.27  --   LATICACIDVEN  --  2.8* 1.1  --   --   --   --   --  1.1 1.2  --   --   INR  --   --   --   --  1.4*  --   --  1.2  --   --   --   --   MG  --   --   --    < > 1.6* 1.5* 1.7 1.6*  --   --  1.6*  --   CALCIUM  --   --   --   --  7.4* 8.0* 8.1* 7.7*  --   --  7.7*  --    < > = values in this interval not displayed.    --------------------------------------------------------------------------------------------------------------- Lab Results  Component Value Date   CHOL 96 12/08/2023   HDL 34 (L) 12/08/2023   LDLCALC 51 12/08/2023   TRIG 55 12/08/2023   CHOLHDL 2.8 12/08/2023    Lab Results  Component Value Date   HGBA1C 6.0 (H) 12/08/2023   No results for input(s): "TSH", "T4TOTAL", "FREET4", "T3FREE", "THYROIDAB" in the last 72 hours. No results for input(s): "VITAMINB12", "FOLATE", "FERRITIN", "TIBC", "IRON", "RETICCTPCT" in the last 72 hours. ------------------------------------------------------------------------------------------------------------------ Cardiac Enzymes No results for input(s): "CKMB", "TROPONINI", "MYOGLOBIN" in the last 168 hours.  Invalid input(s): "CK"  Micro Results Recent Results (from the past 240 hours)  Blood culture (routine x 2)     Status: None   Collection Time: 01/31/24  8:00 PM   Specimen: BLOOD  Result Value Ref Range Status   Specimen Description   Final    BLOOD RIGHT ANTECUBITAL Performed at [facility], [address], [place], [place] [address]    Special Requests   Final    BOTTLES DRAWN  AEROBIC AND ANAEROBIC Blood Culture results may not be optimal due to an inadequate volume of blood received in culture bottles Performed at [facility], [address], [place], [place] [address]    Culture   Final    NO GROWTH 5 DAYS Performed at [facility], [address] [place], [place] [address]    Report Status 02/05/2024 FINAL  Final  Blood culture (routine x 2)     Status: None (Preliminary result)   Collection Time: 02/01/24  6:40 PM   Specimen: BLOOD LEFT ARM  Result Value Ref Range Status   Specimen Description   Final    BLOOD LEFT ARM Performed at [facility], [address], [place], [place] [address]    Special Requests   Final    BOTTLES DRAWN AEROBIC AND ANAEROBIC Blood Culture adequate volume Performed at [facility], [address] [place], [place] [address]    Culture   Final    NO GROWTH 4 DAYS Performed at [facility], [address], [place], [place] [address]    Report Status PENDING  Incomplete  MRSA Next Gen by PCR, Nasal     Status: None   Collection Time: 02/01/24  7:05 PM   Specimen: Nasal Mucosa; Nasal Swab  Result Value Ref Range Status   MRSA by PCR Next Gen NOT DETECTED NOT DETECTED Final    Comment: (NOTE) The GeneXpert MRSA Assay (FDA approved for NASAL specimens only), is one component of a comprehensive MRSA colonization surveillance program. It is not intended to diagnose MRSA infection nor to guide or monitor treatment for MRSA infections. Test performance is not FDA approved in patients less than 38 years old. Performed at [facility], [address] [place], [place] [address]   Aerobic/Anaerobic Culture w Gram Stain (surgical/deep wound)     Status: None (Preliminary result)   Collection Time: 02/05/24  9:31 AM   Specimen: Abscess  Result Value Ref Range Status   Specimen Description ABSCESS  Final   Special Requests NONE  Final   Gram Stain    Final    ABUNDANT WBC PRESENT, PREDOMINANTLY PMN NO ORGANISMS SEEN Performed at [facility], [address], [place], [place] [address]    Culture PENDING  Incomplete   Report Status PENDING  Incomplete    Radiology Report ECHOCARDIOGRAM COMPLETE Result Date: 02/05/2024    ECHOCARDIOGRAM REPORT   Patient Name:   Farrell Stiteler Date of Exam: 02/05/2024 Medical Rec #:  [id number]   Height:       63.0 in Accession #:    [id number]  Weight:       130.5 lb Date of Birth:  1991/08/07   BSA:          1.613 m Patient Age:    32 years    BP:           108/58 mmHg Patient Gender: M           HR:           84 bpm. Exam Location:  Inpatient Procedure: 2D Echo, Color Doppler and Cardiac Doppler (Both Spectral and Color            Flow Doppler were utilized during procedure). Indications:    Endocarditis  History:        Patient has no prior history of Echocardiogram examinations.  Sonographer:    Andrena Bang Referring Phys: Hadley Leu  Sonographer Comments: Pt laying on right side due to infection in left leg and bottom. IMPRESSIONS  1. Left ventricular ejection fraction, by estimation, is 60 to 65%. The left ventricle has normal function. The left ventricle has no regional wall motion abnormalities. Left ventricular diastolic parameters were normal.  2. Right ventricular systolic function is normal. The right ventricular size is normal. Tricuspid regurgitation signal is inadequate for assessing PA pressure.  3. The mitral valve is grossly normal. No evidence of mitral valve regurgitation. No evidence of mitral stenosis.  4. The aortic valve was not well visualized. There is mild calcification of the aortic valve. There is mild thickening of the aortic valve. Aortic valve regurgitation is not visualized. No aortic stenosis is present.  5. The inferior vena cava is normal in size with greater than 50% respiratory variability, suggesting right atrial pressure of 3 mmHg. Comparison(s): No prior Echocardiogram.  Conclusion(s)/Recommendation(s): No evidence of valvular vegetations on this transthoracic echocardiogram. Consider a transesophageal echocardiogram to exclude infective endocarditis if clinically indicated. FINDINGS  Left Ventricle: Left ventricular ejection  fraction, by estimation, is 60 to 65%. The left ventricle has normal function. The left ventricle has no regional wall motion abnormalities. The left ventricular internal cavity size was normal in size. There is  no left ventricular hypertrophy. Left ventricular diastolic parameters were normal. Right Ventricle: The right ventricular size is normal. No increase in right ventricular wall thickness. Right ventricular systolic function is normal. Tricuspid regurgitation signal is inadequate for assessing PA pressure. Left Atrium: Left atrial size was normal in size. Right Atrium: Right atrial size was normal in size. Pericardium: There is no evidence of pericardial effusion. Mitral Valve: The mitral valve is grossly normal. No evidence of mitral valve regurgitation. No evidence of mitral valve stenosis. Tricuspid Valve: The tricuspid valve is normal in structure. Tricuspid valve regurgitation is not demonstrated. No evidence of tricuspid stenosis. Aortic Valve: The aortic valve was not well visualized. There is mild calcification of the aortic valve. There is mild thickening of the aortic valve. Aortic valve regurgitation is not visualized. No aortic stenosis is present. Aortic valve mean gradient  measures 3.0 mmHg. Aortic valve peak gradient measures 8.3 mmHg. Aortic valve area, by VTI measures 3.62 cm. Pulmonic Valve: The pulmonic valve was not well visualized. Pulmonic valve regurgitation is not visualized. No evidence of pulmonic stenosis. Aorta: The aortic root and ascending aorta are structurally normal, with no evidence of dilitation. Venous: The inferior vena cava is normal in size with greater than 50% respiratory variability, suggesting right atrial  pressure of 3 mmHg. IAS/Shunts: The atrial septum is grossly normal.  LEFT VENTRICLE PLAX 2D LVIDd:         4.50 cm     Diastology LVIDs:         3.60 cm     LV e' medial:    11.00 cm/s LV PW:         0.90 cm     LV E/e' medial:  8.6 LV IVS:        1.10 cm     LV e' lateral:   15.90 cm/s LVOT diam:     2.10 cm     LV E/e' lateral: 6.0 LV SV:         81 LV SV Index:   50 LVOT Area:     3.46 cm  LV Volumes (MOD) LV vol d, MOD A2C: 68.4 ml LV vol d, MOD A4C: 83.1 ml LV vol s, MOD A2C: 17.9 ml LV vol s, MOD A4C: 36.6 ml LV SV MOD A2C:     50.5 ml LV SV MOD A4C:     83.1 ml LV SV MOD BP:      51.5 ml RIGHT VENTRICLE RV S prime:     8.92 cm/s TAPSE (M-mode): 1.6 cm LEFT ATRIUM             Index LA Vol (A2C):   15.7 ml 9.73 ml/m LA Vol (A4C):   26.4 ml 16.37 ml/m LA Biplane Vol: 21.3 ml 13.21 ml/m  AORTIC VALVE AV Area (Vmax):    3.42 cm AV Area (Vmean):   3.76 cm AV Area (VTI):     3.62 cm AV Vmax:           144.00 cm/s AV Vmean:          77.100 cm/s AV VTI:            0.225 m AV Peak Grad:      8.3 mmHg AV Mean Grad:      3.0 mmHg  LVOT Vmax:         142.00 cm/s LVOT Vmean:        83.800 cm/s LVOT VTI:          0.235 m LVOT/AV VTI ratio: 1.04  AORTA Ao Asc diam: 2.70 cm MITRAL VALVE MV Area (PHT): 4.21 cm    SHUNTS MV Decel Time: 180 msec    Systemic VTI:  0.24 m MV E velocity: 94.90 cm/s  Systemic Diam: 2.10 cm MV A velocity: 71.80 cm/s MV E/A ratio:  1.32 Sheryle Donning MD Electronically signed by Sheryle Donning MD Signature Date/Time: 02/05/2024/5:20:12 PM    Final    US  FINE NEEDLE ASP 1ST LESION Result Date: 02/05/2024 INDICATION: Showed arthritis left hip with MRI demonstrating a focal fluid collection in the anterior thigh EXAM: Ultrasound-guided fluid aspiration MEDICATIONS: None. ANESTHESIA/SEDATION: 7 mL of 1% lidocaine  COMPLICATIONS: None immediate. PROCEDURE: Informed written consent was obtained from the patient after a thorough discussion of the procedural risks, benefits and  alternatives. All questions were addressed. Maximal Sterile Barrier Technique was utilized including caps, mask, sterile gowns, sterile gloves, sterile drape, hand hygiene and skin antiseptic. A timeout was performed prior to the initiation of the procedure. The proximal left anterior thigh was evaluated with ultrasound demonstrating the irregular fluid collection most consistent with soft tissue abscess. Ultrasound images were obtained the skin was marked. Skin was then prepped and draped in usual sterile fashion. Local lidocaine  was then infiltrated into the access site. A small incision was made and then a Yueh needle 7 cm in length was then advanced from the skin to the abscess pocket under ultrasound guidance. Approximately 18 mL of thick fluid was obtained and sent to pathology for culture. The abscess pocket was completely collapsed with no significant residual fluid remaining. The needle was removed. Sterile dressing was applied. IMPRESSION: Satisfactory ultrasound aspiration of a left anterior thigh fluid collection most consistent with abscess. Cultures obtained. Electronically Signed   By: Susan Ensign   On: 02/05/2024 10:09   DG Chest Port 1 View Result Date: 02/04/2024 CLINICAL DATA:  Shortness of breath. EXAM: PORTABLE CHEST 1 VIEW COMPARISON:  Chest radiograph dated 02/01/2024. FINDINGS: No focal consolidation, pleural effusion or pneumothorax. The cardiac silhouette is within normal limits. No acute osseous pathology. IMPRESSION: No active disease. Electronically Signed   By: Angus Bark M.D.   On: 02/04/2024 13:40   CT PELVIS W CONTRAST Result Date: 02/04/2024 CLINICAL DATA:  Leg swelling and pain. EXAM: CT PELVIS WITH CONTRAST TECHNIQUE: Multidetector CT imaging of the pelvis was performed using the standard protocol following the bolus administration of intravenous contrast. RADIATION DOSE REDUCTION: This exam was performed according to the departmental dose-optimization program which  includes automated exposure control, adjustment of the mA and/or kV according to patient size and/or use of iterative reconstruction technique. CONTRAST:  75mL OMNIPAQUE  IOHEXOL  350 MG/ML SOLN COMPARISON:  05/28/2023. FINDINGS: Urinary Tract:  No abnormality visualized. Bowel: Unremarkable visualized pelvic bowel loops. Normal appendix. Vascular/Lymphatic: No pathologically enlarged lymph nodes. No significant vascular abnormality seen. Reproductive:  Unremarkable prostate. Other: Left proximal thigh diffuse subcutaneous edema and skin thickening consistent with cellulitis. No subcutaneous air to indicate necrotizing fasciitis and no evidence of Ludwig's angina. Musculoskeletal: Intramuscular rim enhancing fluid collection consistent with a 4 cm abscess in the proximal left thigh. IMPRESSION: Left proximal thigh cellulitis with an intramuscular abscess. Electronically Signed   By: Sydell Eva M.D.   On: 02/04/2024 10:14     Signature  -  Lynnwood Sauer M.D on 02/06/2024 at 7:37 AM   -  To page go to [email or web address]

## 2024-02-06 NOTE — Progress Notes (Signed)
 Physical Therapy Treatment Patient Details Name: Mark Hammond MRN: [id number] DOB: Sep 19, 1991 Today's Date: 02/06/2024   History of Present Illness Pt is 33 yo presenting to [facility] ED on 4/30 due to L leg pain for several months. MRI of lumbar spine was negative. 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 Drainage of his left thigh abscess by IR on 02/05/2024 Pmh: Alcoholic gastritis, BPD, Depression, drug abuse, ETOH abuse, hallucination, schizo affective schizophrenia.    PT Comments  Looks much better from initial physical therapy evaluation. Following commands appropriately, not impulsive, eager to mobilize with therapist. Required min assist for bed mobility and transfer. Close CGA with gait, opted to use RW rather than rollator after practicing with both, as RW provided more stability and a ability to offset load from LLE. VSS throughout. Patient will continue to benefit from skilled physical therapy services to further improve independence with functional mobility.     If plan is discharge home, recommend the following: Assist for transportation;Supervision due to cognitive status;A little help with walking and/or transfers;A little help with bathing/dressing/bathroom   Can travel by private Theme park manager cushion (measurements PT);Wheelchair (measurements PT) (pending progress)    Recommendations for Other Services       Precautions / Restrictions Precautions Precautions: Fall Recall of Precautions/Restrictions: Impaired Restrictions Weight Bearing Restrictions Per Provider Order: No     Mobility  Bed Mobility Overal bed mobility: Needs Assistance Bed Mobility: Supine to Sit, Sit to Supine     Supine to sit: Min assist Sit to supine: Min assist   General bed mobility comments: Min assist for pt to pull  through therapist's hand and rise to EOB. Min assist support for LLE into bed. Cues for technique. Guarded and slow due to LLE pain with movement.    Transfers Overall transfer level: Needs assistance Equipment used: Rolling walker (2 wheels), Rollator (4 wheels) Transfers: Sit to/from Stand Sit to Stand: Min assist           General transfer comment: Min assist practiced with rollator and RW. Pt more stable with RW.    Ambulation/Gait Ambulation/Gait assistance: Contact guard assist Gait Distance (Feet): 18 Feet Assistive device: Rolling walker (2 wheels), Rollator (4 wheels) Gait Pattern/deviations: Step-to pattern, Decreased stance time - left, Decreased stride length, Decreased dorsiflexion - left, Decreased weight shift to left, Antalgic Gait velocity: dec Gait velocity interpretation: <1.31 ft/sec, indicative of household ambulator   General Gait Details: Minimal to no WB tolerated on LLE. Transitioned from rollator to RW which provided significant improvement in support and stability. Tolerated short distance in room with education for sequencing and RW use.   Stairs             Wheelchair Mobility     Tilt Bed    Modified Rankin (Stroke Patients Only)       Balance Overall balance assessment: Needs assistance Sitting-balance support: Feet supported Sitting balance-Leahy Scale: Fair Sitting balance - Comments: Leans to Rt due to LLE pain Postural control: Right lateral lean Standing balance support: Bilateral upper extremity supported Standing balance-Leahy Scale: Poor Standing balance comment: Pt requries external support                            Communication Communication Communication: No apparent difficulties  Cognition Arousal: Alert Behavior During Therapy: WFL for tasks assessed/performed   PT - Cognitive impairments: No family/caregiver present to determine baseline, Problem solving, Safety/Judgement                          Following commands: Impaired Following commands impaired: Follows multi-step commands inconsistently    Cueing Cueing Techniques: Verbal cues, Gestural cues, Tactile cues  Exercises      General Comments General comments (skin integrity, edema, etc.): VSS      Pertinent Vitals/Pain Pain Assessment Pain Assessment: Faces Faces Pain Scale: Hurts little more Pain Location: LLE Pain Descriptors / Indicators: Grimacing, Guarding, Aching Pain Intervention(s): Limited activity within patient's tolerance, Monitored during session, Repositioned    Home Living                          Prior Function            PT Goals (current goals can now be found in the care plan section) Acute Rehab PT Goals Patient Stated Goal: Get well PT Goal Formulation: With patient Time For Goal Achievement: 02/18/24 Potential to Achieve Goals: Fair Progress towards PT goals: Progressing toward goals    Frequency    Min 2X/week      PT Plan      Co-evaluation              AM-PAC PT "6 Clicks" Mobility   Outcome Measure  Help needed turning from your back to your side while in a flat bed without using bedrails?: A Little Help needed moving from lying on your back to sitting on the side of a flat bed without using bedrails?: A Little Help needed moving to and from a bed to a chair (including a wheelchair)?: A Little Help needed standing up from a chair using your arms (e.g., wheelchair or bedside chair)?: A Little Help needed to walk in hospital room?: A Little Help needed climbing 3-5 steps with a railing? : Total 6 Click Score: 16    End of Session Equipment Utilized During Treatment: Gait belt Activity Tolerance: Patient tolerated treatment well Patient left: in bed;with call bell/phone within reach;with bed alarm set Nurse Communication: Mobility status PT Visit Diagnosis: Unsteadiness on feet (R26.81);Other abnormalities of gait and mobility  (R26.89);Pain Pain - Right/Left: Left Pain - part of body: Hip;Knee     Time: 8119-1478 PT Time Calculation (min) (ACUTE ONLY): 14 min  Charges:    $Gait Training: 8-22 mins PT General Charges $$ ACUTE PT VISIT: 1 Visit                     Jory Ng, PT, DPT [facility]  Rehabilitation Services Physical Therapist Office: [phone] Website: [email or web address]    Alinda Irani 02/06/2024, 4:01 PM

## 2024-02-06 NOTE — Progress Notes (Signed)
 Pt requesting to be have continuous pulse oximetry discontinued. Notified MD Zelda Hickman. MD d/c continuous pulse oximetry.

## 2024-02-06 NOTE — Plan of Care (Signed)

## 2024-02-06 NOTE — Consult Note (Signed)
 NEUROLOGY CONSULT NOTE   Date of service: Feb 06, 2024 Patient Name: Mark Hammond MRN:  [id number] DOB:  24-Aug-1991 Chief Complaint: "left leg weakness " Requesting Provider: Cala Castleman, MD  History of Present Illness  Mark Hammond is a 33 y.o. male with hx of polysubstance abuse (alcohol, cocaine , heroin, MDMA, marijuana and tobacco), bipolar disorder and schizoaffective disorder who presented to the ED on 4/30 with complaint of left foot pain and swelling and warmth.  Workup was suggestive of diffuse septic arthritis of the left hip, proximal femur along with left hip and possible pelvic abscess. He recently underwent left abscess drainage on 5/4. Neurology was consulted in the setting of continued left lower extremity weakness.  On chart review, patient was evaluated Dr. Salli Crawley, for ongoing left thigh pain/numbness on 01/25/24. He was advised to get further imaging and report to the ER for further evaluation.   On assessment today, patient reports left lower extremity pain extends from the hip to knee.  Pain is a 6 out of 10. Reports symptoms have been ongoing since January. Associated symptoms include leg weakness and occasional paresthesia. Symptoms have limited physical activity (walking and running). Patient denies any trauma or accidents to site.    ROS  Comprehensive ROS performed and pertinent positives documented in HPI  Past History   Past Medical History:  Diagnosis Date   Alcoholic gastritis    Bipolar 1 disorder (HCC)    Dental abscess    Depression    Drug abuse (HCC)    ETOH abuse    Hallucination    Schizo affective schizophrenia (HCC)     History reviewed. No pertinent surgical history.  Family History: Family History  Problem Relation Age of Onset   Stroke Other     Social History  reports that he has been smoking cigarettes. He has been exposed to tobacco smoke. He has never used smokeless tobacco. He reports current alcohol use. He reports current  drug use. Frequency: 7.00 times per week. Drugs: "Crack" cocaine , Heroin, MDMA (Ecstacy), Marijuana, and Cocaine .  Allergies  Allergen Reactions   Codeine Other (See Comments)    Patient states parents told him he was allergic at a young age.   Fish Allergy Other (See Comments)    Patient does not wish to eat ANY fish- reaction unclear   Shellfish Allergy Other (See Comments)    Patient does not wish to eat ANY shellfish- reaction unclear    Medications   Current Facility-Administered Medications:    acetaminophen  (TYLENOL ) tablet 650 mg, 650 mg, Oral, Q6H PRN, 650 mg at 02/04/24 1321 **OR** acetaminophen  (TYLENOL ) suppository 650 mg, 650 mg, Rectal, Q6H PRN, Howerter, Justin B, DO, 650 mg at 02/04/24 2102   cefTRIAXone  (ROCEPHIN ) 2 g in sodium chloride  0.9 % 100 mL IVPB, 2 g, Intravenous, Q24H, Orlie Bjornstad, MD, Last Rate: 200 mL/hr at 02/05/24 2012, 2 g at 02/05/24 2012   docusate sodium (COLACE) capsule 200 mg, 200 mg, Oral, BID, Singh, Prashant K, MD, 200 mg at 02/05/24 1605   ferrous sulfate  tablet 325 mg, 325 mg, Oral, Q breakfast, Howerter, Justin B, DO, 325 mg at 02/06/24 7829   folic acid  (FOLVITE ) tablet 1 mg, 1 mg, Oral, Daily, Howerter, Justin B, DO, 1 mg at 02/06/24 0835   heparin injection 5,000 Units, 5,000 Units, Subcutaneous, Q8H, Singh, Prashant K, MD, 5,000 Units at 02/06/24 0622   lactated ringers  infusion, , Intravenous, Continuous, Singh, Prashant K, MD, Last Rate: 75 mL/hr at 02/06/24 0053,  New Bag at 02/06/24 0053   lip balm (CARMEX) ointment, , Topical, PRN, Dahal, Binaya, MD   melatonin tablet 3 mg, 3 mg, Oral, QHS PRN, Howerter, Justin B, DO, 3 mg at 02/02/24 2114   metroNIDAZOLE (FLAGYL) IVPB 500 mg, 500 mg, Intravenous, BID, Bary Boss, DO, Last Rate: 100 mL/hr at 02/06/24 0849, 500 mg at 02/06/24 0849   morphine  (PF) 2 MG/ML injection 1 mg, 1 mg, Intravenous, Q3H PRN, Singh, Prashant K, MD, 1 mg at 02/06/24 1660   multivitamin with minerals tablet 1  tablet, 1 tablet, Oral, Daily, Howerter, Justin B, DO, 1 tablet at 02/06/24 6301   naloxone  (NARCAN ) injection 0.4 mg, 0.4 mg, Intravenous, PRN, Howerter, Justin B, DO   nicotine  (NICODERM CQ  - dosed in mg/24 hours) patch 21 mg, 21 mg, Transdermal, Daily PRN, Howerter, Justin B, DO   ondansetron  (ZOFRAN ) injection 4 mg, 4 mg, Intravenous, Q6H PRN, Howerter, Justin B, DO   pantoprazole (PROTONIX) EC tablet 40 mg, 40 mg, Oral, Daily, Singh, Prashant K, MD, 40 mg at 02/06/24 6010   pneumococcal 20-valent conjugate vaccine (PREVNAR 20) injection 0.5 mL, 0.5 mL, Intramuscular, Prior to discharge, Fonnie Iba I, MD   polyethylene glycol (MIRALAX  / GLYCOLAX ) packet 17 g, 17 g, Oral, BID, Singh, Prashant K, MD, 17 g at 02/05/24 1605   QUEtiapine  (SEROQUEL ) tablet 100 mg, 100 mg, Oral, QHS, Howerter, Justin B, DO, 100 mg at 02/05/24 2231   thiamine  (VITAMIN B1) injection 100 mg, 100 mg, Intravenous, Daily, Howerter, Justin B, DO, 100 mg at 02/06/24 0835   vancomycin  (VANCOCIN ) IVPB 1000 mg/200 mL premix, 1,000 mg, Intravenous, Q12H, Donnajean Fuse, RPH, Last Rate: 200 mL/hr at 02/05/24 2340, 1,000 mg at 02/05/24 2340  Vitals   Vitals:   02/06/24 0027 02/06/24 0500 02/06/24 0738 02/06/24 0812  BP: (!) 100/53  (!) 95/56 101/60  Pulse:   81 66  Resp: 18  19 14   Temp:   98.1 F (36.7 C) 98 F (36.7 C)  TempSrc: Oral   Axillary  SpO2: 100%     Weight:  58.3 kg      Body mass index is 22.77 kg/m.  Physical Exam   Constitutional: Skinny male, laying on right hip in bed Psych: Affect appropriate to situation.  Eyes: No scleral injection.  HENT: No OP obstruction.  Head: Normocephalic.  Cardiovascular: Normal rate.  Respiratory: Effort normal, non-labored breathing.  GI: Soft.  No distension. There is no tenderness.  Skin: WDI.   Neurologic Examination   Neurological Exam:  Mental status/Cognition:  Alert, oriented x 4.  Eyes Open, eye contact appropriate,  Pt has normal  attention. Pt has normal concentration. Pt is able to follow simple step commands. Pt has normal comprehension.  Speech/language: Patient does not have dysarthria, normal volume, normal fluency  Cranial nerves:   CN II Pupils equal and reactive to light, pt blinks to threat bilaterally.   CN III,IV,VI EOM intact, no gaze preference or deviation, no nystagmus   CN V normal sensation in V1, V2, and V3 segments bilaterally   CN VII no asymmetry, no nasolabial fold flattening   CN VIII Makes eye contact to speech.   CN IX & X normal palatal elevation, no uvular deviation   CN XI 5/5 head turn and 5/5 shoulder shrug bilaterally   CN XII midline tongue protrusion    Motor:  Muscle tone normal.  Assessed strength through coaching and encouragement.  Strength Testing Left Right  Head turn 5/5  5/5  Shoulder shrug (C5) 5/5 5/5  Hand grip (C8-T1) 5/5 5/5  Deltoid Up (C5-C6) 5/5 5/5  Deltoid Down (C5-C6) 5/5 5/5  Push (C7) 5/5 5/5  Pull (C5-C6) 5/5 5/5  Thigh Raise (L4-S2) 3/5 5/5  Knee Extension (L3-L4) 5/5 5/5  Knee Flexion (L5-S1) 5/5 5/5  Ankle Dorsiflexion (L4-L5) 5/5 5/5  Ankle Plantarflexion (S1-S2) 5/5 5/5        Sensation: Intact to light touch and equal bilaterally  Coordination/Complex Motor:  - Gait: Deferred   Labs/Imaging/Neurodiagnostic studies   CBC:  Recent Labs  Lab 02/08/24 0019 02/06/24 0749  WBC 4.5 3.5*  NEUTROABS 2.5 1.6*  HGB 9.3* 9.6*  HCT 28.2* 29.3*  MCV 89.0 88.3  PLT 241 259   Basic Metabolic Panel:  Lab Results  Component Value Date   NA 136 02/06/2024   K 3.3 (L) 02/06/2024   CO2 25 02/06/2024   GLUCOSE 101 (H) 02/06/2024   BUN <5 (L) 02/06/2024   CREATININE 0.52 (L) 02/06/2024   CALCIUM 7.8 (L) 02/06/2024   GFRNONAA >60 02/06/2024   GFRAA >60 02/02/2020   Lipid Panel:  Lab Results  Component Value Date   LDLCALC 51 12/08/2023   HgbA1c:  Lab Results  Component Value Date   HGBA1C 6.0 (H) 12/08/2023   Urine Drug Screen:      Component Value Date/Time   LABOPIA POSITIVE (A) 01/31/2024 2221   COCAINSCRNUR POSITIVE (A) 01/31/2024 2221   COCAINSCRNUR See Final Results 08/04/2023 0941   LABBENZ NONE DETECTED 01/31/2024 2221   AMPHETMU POSITIVE (A) 01/31/2024 2221   THCU POSITIVE (A) 01/31/2024 2221   LABBARB NONE DETECTED 01/31/2024 2221    Alcohol Level     Component Value Date/Time   ETH <15 01/31/2024 1934   INR  Lab Results  Component Value Date   INR 1.2 02/04/2024   APTT  Lab Results  Component Value Date   APTT 41 (H) 02/01/2024   AED levels: No results found for: "PHENYTOIN", "ZONISAMIDE", "LAMOTRIGINE", "LEVETIRACETA"  IMAGING   MR Brain 01/06/24 Normal noncontrast MRI appearance of the brain.   MR Lumbar Spine wo contrast (01/06/24)  1. Limited evaluation today due to largely nondiagnostic axial imaging.   2. New abnormal lower lumbar erector spinae muscle signal which could be inflammatory versus posttraumatic muscle edema. No paraspinal fluid collection is evident.   3. Otherwise sagittal noncontrast lumbar spine images appears stable to that in February, normal for age.    ASSESSMENT  Tharun Puck is a 33 y.o. male with hx of polysubstance abuse (alcohol, cocaine , heroin, MDMA, marijuana and tobacco), bipolar disorder and schizoaffective disorder who presented to the ED on 4/30 with complaint of left foot pain and swelling and warmth.  Workup was suggestive of diffuse septic arthritis of the left hip, proximal femur along with left hip and possible pelvic abscess. He recently underwent left abscess drainage on 5/4. Neurology was consulted in the setting of continued left lower extremity weakness.  After assessment of the patient, suspect he is exhibiting guarding behavior/hesitancy secondary to pain. Low suspicion for nerve impingement given intact sensation throughout left lower extremity. Patient will need nerve conduction studies performed in outpatient setting for further workup of  weakness. Neurology will sign off.  RECOMMENDATIONS   - Restart Home Gabapentin  300 mg twice daily to optimize pain control  - Continue to follow up outpatient with Dr. Salli Crawley - Recommend EMG nerve conduction study in outpatient setting  - neurology inpatient team will signoff. Please feel  free to contact us  with any questions or concerns. ______________________________________________________________________  Signed, DONOVAN RAINEY, MD [facility] PGY-1   NEUROHOSPITALIST ADDENDUM Performed a face to face diagnostic evaluation.   I have reviewed the contents of history and physical exam as documented by PA/ARNP/Resident and agree with above documentation.  I have discussed and formulated the above plan as documented. Edits to the note have been made as needed.  Shterna Laramee, MD [facility] [phone]   If 7pm to 7am, please call on call as listed on AMION.

## 2024-02-07 DIAGNOSIS — L03116 Cellulitis of left lower limb: Secondary | ICD-10-CM | POA: Diagnosis not present

## 2024-02-07 LAB — CBC WITH DIFFERENTIAL/PLATELET
Abs Immature Granulocytes: 0.03 10*3/uL (ref 0.00–0.07)
Basophils Absolute: 0 10*3/uL (ref 0.0–0.1)
Basophils Relative: 1 %
Eosinophils Absolute: 0.3 10*3/uL (ref 0.0–0.5)
Eosinophils Relative: 6 %
HCT: 30 % — ABNORMAL LOW (ref 39.0–52.0)
Hemoglobin: 9.5 g/dL — ABNORMAL LOW (ref 13.0–17.0)
Immature Granulocytes: 1 %
Lymphocytes Relative: 33 %
Lymphs Abs: 1.4 10*3/uL (ref 0.7–4.0)
MCH: 28.2 pg (ref 26.0–34.0)
MCHC: 31.7 g/dL (ref 30.0–36.0)
MCV: 89 fL (ref 80.0–100.0)
Monocytes Absolute: 0.4 10*3/uL (ref 0.1–1.0)
Monocytes Relative: 10 %
Neutro Abs: 2.1 10*3/uL (ref 1.7–7.7)
Neutrophils Relative %: 49 %
Platelets: 316 10*3/uL (ref 150–400)
RBC: 3.37 MIL/uL — ABNORMAL LOW (ref 4.22–5.81)
RDW: 14.6 % (ref 11.5–15.5)
WBC: 4.2 10*3/uL (ref 4.0–10.5)
nRBC: 0 % (ref 0.0–0.2)

## 2024-02-07 LAB — COMPREHENSIVE METABOLIC PANEL WITH GFR
ALT: 63 U/L — ABNORMAL HIGH (ref 0–44)
AST: 115 U/L — ABNORMAL HIGH (ref 15–41)
Albumin: 2.1 g/dL — ABNORMAL LOW (ref 3.5–5.0)
Alkaline Phosphatase: 132 U/L — ABNORMAL HIGH (ref 38–126)
Anion gap: 7 (ref 5–15)
BUN: <5 mg/dL — ABNORMAL LOW (ref 6–20)
CO2: 26 mmol/L (ref 22–32)
Calcium: 8.4 mg/dL — ABNORMAL LOW (ref 8.9–10.3)
Chloride: 103 mmol/L (ref 98–111)
Creatinine, Ser: 0.61 mg/dL (ref 0.61–1.24)
GFR, Estimated: >60 mL/min (ref >60)
Glucose, Bld: 104 mg/dL — ABNORMAL HIGH (ref 70–99)
Potassium: 3.8 mmol/L (ref 3.5–5.1)
Sodium: 136 mmol/L (ref 135–145)
Total Bilirubin: 0.4 mg/dL (ref 0.0–1.2)
Total Protein: 6.5 g/dL (ref 6.5–8.1)

## 2024-02-07 LAB — CORTISOL: Cortisol, Plasma: 8.3 ug/dL

## 2024-02-07 LAB — TSH: TSH: 2.633 u[IU]/mL (ref 0.350–4.500)

## 2024-02-07 LAB — VANCOMYCIN, TROUGH: Vancomycin Tr: 15 ug/mL (ref 15–20)

## 2024-02-07 LAB — CYTOLOGY - NON PAP

## 2024-02-07 LAB — PROCALCITONIN: Procalcitonin: 0.28 ng/mL

## 2024-02-07 LAB — MAGNESIUM: Magnesium: 1.6 mg/dL — ABNORMAL LOW (ref 1.7–2.4)

## 2024-02-07 LAB — C-REACTIVE PROTEIN: CRP: 3 mg/dL — ABNORMAL HIGH (ref <1.0)

## 2024-02-07 LAB — T4, FREE: Free T4: 1.17 ng/dL — ABNORMAL HIGH (ref 0.61–1.12)

## 2024-02-07 MED ORDER — ENOXAPARIN SODIUM 40 MG/0.4ML IJ SOSY
40.0000 mg | PREFILLED_SYRINGE | Freq: Every day | INTRAMUSCULAR | Status: DC
  Administered 2024-02-07 – 2024-02-11 (×4): 40 mg via SUBCUTANEOUS
  Filled 2024-02-07 (×6): qty 0.4

## 2024-02-07 MED ORDER — THIAMINE MONONITRATE 100 MG PO TABS
100.0000 mg | ORAL_TABLET | Freq: Every day | ORAL | Status: DC
  Administered 2024-02-08 – 2024-02-16 (×9): 100 mg via ORAL
  Filled 2024-02-07 (×9): qty 1

## 2024-02-07 MED ORDER — MIDODRINE HCL 5 MG PO TABS
5.0000 mg | ORAL_TABLET | Freq: Three times a day (TID) | ORAL | Status: DC
  Administered 2024-02-07 – 2024-02-10 (×12): 5 mg via ORAL
  Filled 2024-02-07 (×13): qty 1

## 2024-02-07 MED ORDER — MAGNESIUM SULFATE 2 GM/50ML IV SOLN
2.0000 g | Freq: Once | INTRAVENOUS | Status: AC
  Administered 2024-02-07: 2 g via INTRAVENOUS
  Filled 2024-02-07: qty 50

## 2024-02-07 NOTE — Progress Notes (Signed)
 OT Cancellation Note  Patient Details Name: Mark Hammond MRN: [id number] DOB: 08/26/1991   Cancelled Treatment:    Reason Eval/Treat Not Completed: Patient declined, no reason specified Pt sleeping on entry and requested OT follow up in PM as able.   [name] 02/07/2024, 10:55 AM

## 2024-02-07 NOTE — Progress Notes (Signed)
 Occupational Therapy Treatment Patient Details Name: Mark Hammond MRN: [id number] DOB: 11/06/1990 Today's Date: 02/07/2024   History of present illness Pt is 33 yo presenting to [facility] ED on 4/30 due to L leg pain for several months. MRI of lumbar spine was negative. 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 Drainage of his left thigh abscess by IR on 02/05/2024 Pmh: Alcoholic gastritis, BPD, Depression, drug abuse, ETOH abuse, hallucination, schizo affective schizophrenia.   OT comments  Pt making good progress towards OT goals. Focused session on strategies for LB dressing and brief hallway mobility using RW. Pt remains limited by L hip/upper LE pain hindering WB tolerance and flexibility for LB ADLs. Pt with goal to mobilize without RW; attempted but up to Mod A needed to maintain balance w/ this attempt. Pt may benefit from AE education for LB ADL and further progression of standing tolerance in next sessions.       If plan is discharge home, recommend the following:  A lot of help with bathing/dressing/bathroom;Assistance with cooking/housework;Direct supervision/assist for medications management;Assist for transportation;Help with stairs or ramp for entrance;A little help with walking and/or transfers   Equipment Recommendations  Other (comment) (TBD pending progress)    Recommendations for Other Services      Precautions / Restrictions Precautions Precautions: Fall Recall of Precautions/Restrictions: Impaired Restrictions Weight Bearing Restrictions Per Provider Order: No       Mobility Bed Mobility Overal bed mobility: Needs Assistance Bed Mobility: Supine to Sit     Supine to sit: Min assist     General bed mobility comments: handheld assist pull trunk upwards, use of bedrail    Transfers Overall transfer level: Needs assistance Equipment  used: Rolling walker (2 wheels) Transfers: Sit to/from Stand Sit to Stand: Contact guard assist                 Balance Overall balance assessment: Needs assistance Sitting-balance support: Feet supported Sitting balance-Leahy Scale: Fair     Standing balance support: Bilateral upper extremity supported Standing balance-Leahy Scale: Poor                             ADL either performed or assessed with clinical judgement   ADL Overall ADL's : Needs assistance/impaired                     Lower Body Dressing: Moderate assistance;Sit to/from stand;Sitting/lateral leans Lower Body Dressing Details (indicate cue type and reason): Able to don R sock bringing foot to self. Attempted to cross LE and bend to feet for L sock mgmt but tearful due to pain with this. OT assisted with L sock - discussed other clothing mgmt with pt mostly wearing crocs, reportedly used crutch to don pants around foot prior to admission and says this took 30 min             Functional mobility during ADLs: Contact guard assist;Rolling walker (2 wheels);Moderate assistance (CGA with RW, Mod A with attempt with handheld assist) General ADL Comments: Facilitated mobility in hallway with RW- discussed gradual progression of WB tolerance, LB ADL strategies and pt goals (to be able to walk without walker). Encouarged pt to sit EOB for meals with tray table on L side of bed as pt reportedly ate spaghetti supine in bed today.  Extremity/Trunk Assessment Upper Extremity Assessment Upper Extremity Assessment: Overall WFL for tasks assessed;Right hand dominant   Lower Extremity Assessment Lower Extremity Assessment: Defer to PT evaluation        Vision   Vision Assessment?: No apparent visual deficits   Perception     Praxis     Communication Communication Communication: No apparent difficulties   Cognition Arousal: Alert Behavior During Therapy: WFL for tasks  assessed/performed Cognition: No family/caregiver present to determine baseline             OT - Cognition Comments: likely at baseline, minor cues needed for problem solving and safety but much improved from initial eval                 Following commands: Impaired Following commands impaired: Follows multi-step commands inconsistently      Cueing   Cueing Techniques: Verbal cues, Gestural cues, Tactile cues  Exercises      Shoulder Instructions       General Comments VSS    Pertinent Vitals/ Pain       Pain Assessment Pain Assessment: Faces Faces Pain Scale: Hurts even more Pain Location: LLE with full WB, L hip/thigh with hip flexion or external rotation Pain Descriptors / Indicators: Grimacing, Guarding, Aching, Crying Pain Intervention(s): Monitored during session, Limited activity within patient's tolerance  Home Living                                          Prior Functioning/Environment              Frequency  Min 2X/week        Progress Toward Goals  OT Goals(current goals can now be found in the care plan section)  Progress towards OT goals: Progressing toward goals  Acute Rehab OT Goals Patient Stated Goal: walk without a walker OT Goal Formulation: With patient Time For Goal Achievement: 02/18/24 Potential to Achieve Goals: Good ADL Goals Pt Will Perform Upper Body Dressing: sitting;with contact guard assist Pt Will Perform Lower Body Dressing: with contact guard assist;sitting/lateral leans;sit to/from stand Pt Will Transfer to Toilet: with contact guard assist;ambulating;regular height toilet  Plan      Co-evaluation                 AM-PAC OT "6 Clicks" Daily Activity     Outcome Measure   Help from another person eating meals?: A Little Help from another person taking care of personal grooming?: A Little Help from another person toileting, which includes using toliet, bedpan, or urinal?: A  Lot Help from another person bathing (including washing, rinsing, drying)?: A Lot Help from another person to put on and taking off regular upper body clothing?: A Lot Help from another person to put on and taking off regular lower body clothing?: A Lot 6 Click Score: 14    End of Session Equipment Utilized During Treatment: Rolling walker (2 wheels);Gait belt  OT Visit Diagnosis: Unsteadiness on feet (R26.81);Other abnormalities of gait and mobility (R26.89);Muscle weakness (generalized) (M62.81)   Activity Tolerance Patient tolerated treatment well   Patient Left in bed;with call bell/phone within reach   Nurse Communication Mobility status;Other (comment) (encouraged meals EOB)        Time: 2956-2130 OT Time Calculation (min): 26 min  Charges: OT General Charges $OT Visit: 1 Visit OT Treatments $Self Care/Home Management : 8-22 mins $Therapeutic Activity: 8-22  mins  Lawrence Pretty, OTR/L Acute Rehab Services Office: [phone]   Annabella Barr 02/07/2024, 2:11 PM

## 2024-02-07 NOTE — Progress Notes (Addendum)
 PROGRESS NOTE                                                                                                                                                                                                             Patient Demographics:    Mark Hammond, is a 33 y.o. male, DOB - 06-Nov-1990, ZOX:[id number]  Outpatient Primary MD for the patient is Patient, No Pcp Per    LOS - 7  Admit date - 01/31/2024    Chief Complaint  Patient presents with   Leg Swelling   Generalized Pain       Brief Narrative (HPI from H&P)   33 y.o. male with PMH significant for polysubstance abuse including alcohol, tobacco, cocaine , heroin, MDMA, marijuana, bipolar disorder, schizoaffective schizophrenia, 4/30 patient presented to ED with complaint of left foot pain, swelling, warmth.  Further workup suggestive of diffuse septic arthritis of the left hip, proximal femur along with left hip and possible pelvic abscess.  He is currently septic.  Transferred to my care on 02/04/2024.    Subjective:   Patient in bed, appears comfortable, denies any headache, no fever, no chest pain or pressure, no shortness of breath , no abdominal pain. No new focal weakness.    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  Currently on Vancomycin  and Rocephin , orthopedics, PCCM and ID following, sepsis pathophysiology considerably improved, CT pelvis done Case discussed with Dr. Julio Ohm, patient has a left thigh soft tissue infection, knee per Dr. Julio Ohm looks stable, no event drainage of his left thigh abscess by IR on 02/05/2024 cultures pending-prelim growing Staph , echo nonacute, continues to have left leg weakness despite pain improving will request neurology to evaluate as well.    HX of polysubstance abuse including ongoing alcohol abuse, ongoing cocaine , amphetamine, cannabis abuse.  Counseled to abstain from all.  On CIWA  protocol.   Hypomagnesemia and hypokalemia.  Replaced.    Anemia of chronic disease.  Monitor.  Type screen and check INR.  Left lower extremity weakness.  Mostly guarding from pain, seen by neurology, recent L-spine imaging outpatient stable.  Continue supportive care.  PT OT.  Outpatient neuro follow-up.       Condition - Extremely Guarded  Family Communication  :  None present  Code Status :  Full  Consults  :  Ortho, ID, PCCM, IR, neurology  PUD Prophylaxis :  PPI   Procedures  :     Left anterior thigh abscess aspiration by IR on 02/05/2024.    Echo -  1. Left ventricular ejection fraction, by estimation, is 60 to 65%. The left ventricle has normal function. The left ventricle has no regional wall motion abnormalities. Left ventricular diastolic parameters were normal.   2. Right ventricular systolic function is normal. The right ventricular size is normal. Tricuspid regurgitation signal is inadequate for assessing PA pressure.   3. The mitral valve is grossly normal. No evidence of mitral valve regurgitation. No evidence of mitral stenosis.   4. The aortic valve was not well visualized. There is mild calcification of the aortic valve. There is mild thickening of the aortic valve. Aortic valve regurgitation is not visualized. No aortic stenosis is present.   5. The inferior vena cava is normal in size with greater than 50% respiratory variability, suggesting right atrial pressure of 3 mmHg.   CT pelvis -  Left proximal thigh cellulitis with an intramuscular abscess  MRI - 1. Findings are highly suspicious for septic arthritis and osteomyelitis of the left hip and proximal left femur. The proximal extent of the pelvic involvement is not imaged. The sacroiliac joints are not included. 2. Diffuse edema and enhancement throughout the proximal left thigh musculature with multiple irregular peripherally enhancing fluid collections anteriorly in the proximal left thigh, largest  superficial and lateral to the proximal rectus femoris muscle, suspicious for abscesses. 3. Incomplete visualization of the proximal extension of inflammation into the pelvis along the iliopsoas and obturator internus muscles. 4. Asymmetric subcutaneous edema within the visualized left pelvis and left thigh. 5. Recommend further evaluation of the pelvis (with CT or MRI) to better evaluate the proximal extent of the infection.      Disposition Plan  :    Status is: Inpatient   DVT Prophylaxis  :    enoxaparin (LOVENOX) injection 40 mg Start: 02/07/24 1115 SCDs Start: 01/31/24 2147  Lab Results  Component Value Date   PLT 316 02/07/2024    Diet :  Diet Order             DIET SOFT Fluid consistency: Thin  Diet effective now                    Inpatient Medications  Scheduled Meds:  docusate sodium  200 mg Oral BID   enoxaparin (LOVENOX) injection  40 mg Subcutaneous Daily   ferrous sulfate   325 mg Oral Q breakfast   folic acid   1 mg Oral Daily   metroNIDAZOLE  500 mg Oral Q12H   midodrine  5 mg Oral TID WC   multivitamin with minerals  1 tablet Oral Daily   pantoprazole  40 mg Oral Daily   polyethylene glycol  17 g Oral BID   QUEtiapine   100 mg Oral QHS   thiamine   100 mg Oral Daily   Continuous Infusions:  cefTRIAXone  (ROCEPHIN )  IV 2 g (02/06/24 1959)   magnesium  sulfate bolus IVPB     vancomycin  1,000 mg (02/07/24 0531)   PRN Meds:.acetaminophen  **OR** acetaminophen , haloperidol  lactate, lip balm, LORazepam , melatonin, morphine  injection, naLOXone  (NARCAN )  injection, nicotine , ondansetron  (ZOFRAN ) IV, pneumococcal 20-valent conjugate vaccine    Objective:   Vitals:   02/07/24 0025 02/07/24 0450 02/07/24 0500 02/07/24 0826  BP: (!) 96/56 (!) 96/51  (!) 102/58  Pulse:  Resp: 18 18    Temp: 98.2 F (36.8 C) 97.7 F (36.5 C)  98.2 F (36.8 C)  TempSrc: Axillary Oral  Oral  SpO2:      Weight:   56.4 kg     Wt Readings from Last 3 Encounters:   02/07/24 56.4 kg  01/26/24 56.7 kg  01/26/24 56.7 kg     Intake/Output Summary (Last 24 hours) at 02/07/2024 1028 Last data filed at 02/07/2024 1000 Gross per 24 hour  Intake 500 ml  Output 3100 ml  Net -2600 ml     Physical Exam  Awake- alert, left knee and thigh laying and redness has improved, left hip and leg weakness as compared to the right [place].AT,PERRAL Supple Neck, No JVD,   Symmetrical Chest wall movement, Good air movement bilaterally, CTAB RRR,No Gallops,Rubs or new Murmurs,  +ve B.Sounds, Abd Soft, No tenderness,        Data Review:    Recent Labs  Lab 02/03/24 0324 02/04/24 0632 02/05/24 0019 02/06/24 0749 02/07/24 0445  WBC 6.7 4.1 4.5 3.5* 4.2  HGB 9.3* 8.9* 9.3* 9.6* 9.5*  HCT 30.9* 27.7* 28.2* 29.3* 30.0*  PLT 232 214 241 259 316  MCV 95.7 89.1 89.0 88.3 89.0  MCH 28.8 28.6 29.3 28.9 28.2  MCHC 30.1 32.1 33.0 32.8 31.7  RDW 14.8 14.6 14.2 14.4 14.6  LYMPHSABS 1.7 1.1 1.2 1.1 1.4  MONOABS 0.7 0.6 0.6 0.4 0.4  EOSABS 0.1 0.1 0.2 0.3 0.3  BASOSABS 0.1 0.0 0.0 0.0 0.0    Recent Labs  Lab 01/31/24 1935 01/31/24 1937 01/31/24 2116 01/31/24 2154 02/01/24 0442 02/02/24 0313 02/03/24 0324 02/04/24 1610 02/04/24 9604 02/04/24 1147 02/05/24 0019 02/06/24 0418 02/06/24 0749 02/07/24 0445  NA  --   --   --   --  129* 133* 135 133*  --   --  135  --  136 136  K  --   --   --   --  3.3* 3.4* 3.7 3.2*  --   --  3.2*  --  3.3* 3.8  CL  --   --   --   --  96* 97* 100 97*  --   --  101  --  104 103  CO2  --   --   --   --  23 27 24 26   --   --  26  --  25 26  ANIONGAP  --   --   --   --  10 9 11 10   --   --  8  --  7 7  GLUCOSE  --   --   --   --  237* 103* 99 109*  --   --  103*  --  101* 104*  BUN  --   --   --   --  6 <5* 6 <5*  --   --  <5*  --  <5* <5*  CREATININE  --   --   --   --  0.47* 0.42* 0.56* 0.55*  --   --  0.51*  --  0.52* 0.61  AST  --   --   --   --  77* 87* 68* 143*  --   --  119*  --  108* 115*  ALT  --   --   --   --  45* 47*  40 61*  --   --  60*  --  58* 63*  ALKPHOS  --   --   --   --  101 107 86 99  --   --  92  --  110 132*  BILITOT  --   --   --   --  1.2 1.2 0.8 0.8  --   --  0.7  --  0.6 0.4  ALBUMIN  --   --   --   --  2.2* 2.4* 2.1* 1.9*  --   --  1.8*  --  2.0* 2.1*  CRP 21.0*  --   --   --   --   --   --  10.6*  --   --  8.5* 5.0*  --  3.0*  PROCALCITON  --   --   --   --   --   --   --  0.27  --   --  0.27  --  0.32 0.28  LATICACIDVEN  --  2.8* 1.1  --   --   --   --   --  1.1 1.2  --   --   --   --   INR  --   --   --   --  1.4*  --   --  1.2  --   --   --   --   --   --   TSH  --   --   --   --   --   --   --   --   --   --   --   --   --  2.633  MG  --   --   --    < > 1.6* 1.5* 1.7 1.6*  --   --  1.6*  --  1.7 1.6*  PHOS  --   --   --   --  2.4* 2.8 3.9 3.4  --   --   --   --   --   --   CALCIUM  --   --   --   --  7.4* 8.0* 8.1* 7.7*  --   --  7.7*  --  7.8* 8.4*   < > = values in this interval not displayed.      Recent Labs  Lab 01/31/24 1935 01/31/24 1937 01/31/24 2116 01/31/24 2154 02/01/24 0442 02/02/24 0313 02/03/24 0324 02/04/24 2130 02/04/24 8657 02/04/24 1147 02/05/24 0019 02/06/24 0418 02/06/24 0749 02/07/24 0445  CRP 21.0*  --   --   --   --   --   --  10.6*  --   --  8.5* 5.0*  --  3.0*  PROCALCITON  --   --   --   --   --   --   --  0.27  --   --  0.27  --  0.32 0.28  LATICACIDVEN  --  2.8* 1.1  --   --   --   --   --  1.1 1.2  --   --   --   --   INR  --   --   --   --  1.4*  --   --  1.2  --   --   --   --   --   --   TSH  --   --   --   --   --   --   --   --   --   --   --   --   --  2.633  MG  --   --   --    < >  1.6*   < > 1.7 1.6*  --   --  1.6*  --  1.7 1.6*  CALCIUM  --   --   --   --  7.4*   < > 8.1* 7.7*  --   --  7.7*  --  7.8* 8.4*   < > = values in this interval not displayed.    --------------------------------------------------------------------------------------------------------------- Lab Results  Component Value Date   CHOL 96 12/08/2023   HDL  34 (L) 12/08/2023   LDLCALC 51 12/08/2023   TRIG 55 12/08/2023   CHOLHDL 2.8 12/08/2023    Lab Results  Component Value Date   HGBA1C 6.0 (H) 12/08/2023   Recent Labs    02/07/24 0445  TSH 2.633  FREET4 1.17*   No results for input(s): "VITAMINB12", "FOLATE", "FERRITIN", "TIBC", "IRON", "RETICCTPCT" in the last 72 hours. ------------------------------------------------------------------------------------------------------------------ Cardiac Enzymes No results for input(s): "CKMB", "TROPONINI", "MYOGLOBIN" in the last 168 hours.  Invalid input(s): "CK"  Micro Results Recent Results (from the past 240 hours)  Blood culture (routine x 2)     Status: None   Collection Time: 01/31/24  8:00 PM   Specimen: BLOOD  Result Value Ref Range Status   Specimen Description   Final    BLOOD RIGHT ANTECUBITAL Performed at [facility], [address], [place], [place] [address]    Special Requests   Final    BOTTLES DRAWN AEROBIC AND ANAEROBIC Blood Culture results may not be optimal due to an inadequate volume of blood received in culture bottles Performed at [facility], [address], [place], [place] [address]    Culture   Final    NO GROWTH 5 DAYS Performed at [facility], [address] [place], [place] [address]    Report Status 02/05/2024 FINAL  Final  Blood culture (routine x 2)     Status: None   Collection Time: 02/01/24  6:40 PM   Specimen: BLOOD LEFT ARM  Result Value Ref Range Status   Specimen Description   Final    BLOOD LEFT ARM Performed at [facility], [address], [place], [place] [address]    Special Requests   Final    BOTTLES DRAWN AEROBIC AND ANAEROBIC Blood Culture adequate volume Performed at [facility], [address] [place], [place] [address]    Culture   Final    NO GROWTH 5 DAYS Performed at [facility], [address] [place] [address], [place], [place] [address]    Report  Status 02/06/2024 FINAL  Final  MRSA Next Gen by PCR, Nasal     Status: None   Collection Time: 02/01/24  7:05 PM   Specimen: Nasal Mucosa; Nasal Swab  Result Value Ref Range Status   MRSA by PCR Next Gen NOT DETECTED NOT DETECTED Final    Comment: (NOTE) The GeneXpert MRSA Assay (FDA approved for NASAL specimens only), is one component of a comprehensive MRSA colonization surveillance program. It is not intended to diagnose MRSA infection nor to guide or monitor treatment for MRSA infections. Test performance is not FDA approved in patients less than 22 years old. Performed at [facility], [address] [place], [place] [address]   Aerobic/Anaerobic Culture w Gram Stain (surgical/deep wound)     Status: None (Preliminary result)   Collection Time: 02/05/24  9:31 AM   Specimen: Abscess  Result Value Ref Range Status   Specimen Description ABSCESS  Final  Special Requests NONE  Final   Gram Stain   Final    ABUNDANT WBC PRESENT, PREDOMINANTLY PMN NO ORGANISMS SEEN    Culture   Final    RARE STAPHYLOCOCCUS AUREUS SUSCEPTIBILITIES TO FOLLOW Performed at [facility], [address], [place], [place] [address]    Report Status PENDING  Incomplete    Radiology Report ECHOCARDIOGRAM COMPLETE Result Date: 02/05/2024    ECHOCARDIOGRAM REPORT   Patient Name:   Tej Klindt Date of Exam: 02/05/2024 Medical Rec #:  [id number]   Height:       63.0 in Accession #:    [id number]  Weight:       130.5 lb Date of Birth:  02-20-1991   BSA:          1.613 m Patient Age:    32 years    BP:           108/58 mmHg Patient Gender: M           HR:           84 bpm. Exam Location:  Inpatient Procedure: 2D Echo, Color Doppler and Cardiac Doppler (Both Spectral and Color            Flow Doppler were utilized during procedure). Indications:    Endocarditis  History:        Patient has no prior history of Echocardiogram examinations.  Sonographer:    Andrena Bang Referring Phys: Hadley Leu  Sonographer Comments: Pt laying on right side due to infection in left leg and bottom. IMPRESSIONS  1. Left ventricular ejection fraction, by estimation, is 60 to 65%. The left ventricle has normal function. The left ventricle has no regional wall motion abnormalities. Left ventricular diastolic parameters were normal.  2. Right ventricular systolic function is normal. The right ventricular size is normal. Tricuspid regurgitation signal is inadequate for assessing PA pressure.  3. The mitral valve is grossly normal. No evidence of mitral valve regurgitation. No evidence of mitral stenosis.  4. The aortic valve was not well visualized. There is mild calcification of the aortic valve. There is mild thickening of the aortic valve. Aortic valve regurgitation is not visualized. No aortic stenosis is present.  5. The inferior vena cava is normal in size with greater than 50% respiratory variability, suggesting right atrial pressure of 3 mmHg. Comparison(s): No prior Echocardiogram. Conclusion(s)/Recommendation(s): No evidence of valvular vegetations on this transthoracic echocardiogram. Consider a transesophageal echocardiogram to exclude infective endocarditis if clinically indicated. FINDINGS  Left Ventricle: Left ventricular ejection fraction, by estimation, is 60 to 65%. The left ventricle has normal function. The left ventricle has no regional wall motion abnormalities. The left ventricular internal cavity size was normal in size. There is  no left ventricular hypertrophy. Left ventricular diastolic parameters were normal. Right Ventricle: The right ventricular size is normal. No increase in right ventricular wall thickness. Right ventricular systolic function is normal. Tricuspid regurgitation signal is inadequate for assessing PA pressure. Left Atrium: Left atrial size was normal in size. Right Atrium: Right atrial size was normal in size. Pericardium: There is no evidence of pericardial effusion. Mitral  Valve: The mitral valve is grossly normal. No evidence of mitral valve regurgitation. No evidence of mitral valve stenosis. Tricuspid Valve: The tricuspid valve is normal in structure. Tricuspid valve regurgitation is not demonstrated. No evidence of tricuspid stenosis. Aortic Valve: The aortic valve was not well visualized. There is mild calcification of the aortic valve. There is mild  thickening of the aortic valve. Aortic valve regurgitation is not visualized. No aortic stenosis is present. Aortic valve mean gradient  measures 3.0 mmHg. Aortic valve peak gradient measures 8.3 mmHg. Aortic valve area, by VTI measures 3.62 cm. Pulmonic Valve: The pulmonic valve was not well visualized. Pulmonic valve regurgitation is not visualized. No evidence of pulmonic stenosis. Aorta: The aortic root and ascending aorta are structurally normal, with no evidence of dilitation. Venous: The inferior vena cava is normal in size with greater than 50% respiratory variability, suggesting right atrial pressure of 3 mmHg. IAS/Shunts: The atrial septum is grossly normal.  LEFT VENTRICLE PLAX 2D LVIDd:         4.50 cm     Diastology LVIDs:         3.60 cm     LV e' medial:    11.00 cm/s LV PW:         0.90 cm     LV E/e' medial:  8.6 LV IVS:        1.10 cm     LV e' lateral:   15.90 cm/s LVOT diam:     2.10 cm     LV E/e' lateral: 6.0 LV SV:         81 LV SV Index:   50 LVOT Area:     3.46 cm  LV Volumes (MOD) LV vol d, MOD A2C: 68.4 ml LV vol d, MOD A4C: 83.1 ml LV vol s, MOD A2C: 17.9 ml LV vol s, MOD A4C: 36.6 ml LV SV MOD A2C:     50.5 ml LV SV MOD A4C:     83.1 ml LV SV MOD BP:      51.5 ml RIGHT VENTRICLE RV S prime:     8.92 cm/s TAPSE (M-mode): 1.6 cm LEFT ATRIUM             Index LA Vol (A2C):   15.7 ml 9.73 ml/m LA Vol (A4C):   26.4 ml 16.37 ml/m LA Biplane Vol: 21.3 ml 13.21 ml/m  AORTIC VALVE AV Area (Vmax):    3.42 cm AV Area (Vmean):   3.76 cm AV Area (VTI):     3.62 cm AV Vmax:           144.00 cm/s AV Vmean:           77.100 cm/s AV VTI:            0.225 m AV Peak Grad:      8.3 mmHg AV Mean Grad:      3.0 mmHg LVOT Vmax:         142.00 cm/s LVOT Vmean:        83.800 cm/s LVOT VTI:          0.235 m LVOT/AV VTI ratio: 1.04  AORTA Ao Asc diam: 2.70 cm MITRAL VALVE MV Area (PHT): 4.21 cm    SHUNTS MV Decel Time: 180 msec    Systemic VTI:  0.24 m MV E velocity: 94.90 cm/s  Systemic Diam: 2.10 cm MV A velocity: 71.80 cm/s MV E/A ratio:  1.32 Sheryle Donning MD Electronically signed by Sheryle Donning MD Signature Date/Time: 02/05/2024/5:20:12 PM    Final      Signature  -   Lynnwood Sauer M.D on 02/07/2024 at 10:28 AM   -  To page go to [email or web address]

## 2024-02-07 NOTE — TOC CM/SW Note (Signed)
 Transition of Care [facility]) - Inpatient Brief Assessment   Patient Details  Name: Mark Hammond MRN: [id number] Date of Birth: 03-Jul-1991  [facility] CM/SW Contact:    Jannice Mends, LCSW Phone Number: 02/07/2024, 9:02 AM   Clinical Narrative: TOC continuing to follow for medical progression.   Transition of Care Asessment: Insurance and Status: Insurance coverage has been reviewed Patient has primary care physician: Yes Home environment has been reviewed: home Prior level of function:: independent Prior/Current Home Services: No current home services Social Drivers of Health Review: SDOH reviewed no interventions necessary Readmission risk has been reviewed: Yes Transition of care needs: transition of care needs identified, TOC will continue to follow

## 2024-02-07 NOTE — Plan of Care (Signed)
  Problem: Education: °Goal: Knowledge of General Education information will improve °Description: Including pain rating scale, medication(s)/side effects and non-pharmacologic comfort measures °Outcome: Progressing °  °Problem: Clinical Measurements: °Goal: Will remain free from infection °Outcome: Progressing °Goal: Respiratory complications will improve °Outcome: Progressing °Goal: Cardiovascular complication will be avoided °Outcome: Progressing °  °Problem: Coping: °Goal: Level of anxiety will decrease °Outcome: Progressing °  °

## 2024-02-07 NOTE — Plan of Care (Signed)

## 2024-02-08 ENCOUNTER — Inpatient Hospital Stay (HOSPITAL_COMMUNITY): Payer: MEDICAID

## 2024-02-08 DIAGNOSIS — F1910 Other psychoactive substance abuse, uncomplicated: Secondary | ICD-10-CM | POA: Diagnosis not present

## 2024-02-08 DIAGNOSIS — M00852 Arthritis due to other bacteria, left hip: Secondary | ICD-10-CM | POA: Diagnosis not present

## 2024-02-08 DIAGNOSIS — L03116 Cellulitis of left lower limb: Secondary | ICD-10-CM | POA: Diagnosis not present

## 2024-02-08 LAB — COMPREHENSIVE METABOLIC PANEL WITH GFR
ALT: 56 U/L — ABNORMAL HIGH (ref 0–44)
AST: 91 U/L — ABNORMAL HIGH (ref 15–41)
Albumin: 2.3 g/dL — ABNORMAL LOW (ref 3.5–5.0)
Alkaline Phosphatase: 115 U/L (ref 38–126)
Anion gap: 12 (ref 5–15)
BUN: <5 mg/dL — ABNORMAL LOW (ref 6–20)
CO2: 23 mmol/L (ref 22–32)
Calcium: 8.5 mg/dL — ABNORMAL LOW (ref 8.9–10.3)
Chloride: 100 mmol/L (ref 98–111)
Creatinine, Ser: 0.53 mg/dL — ABNORMAL LOW (ref 0.61–1.24)
GFR, Estimated: >60 mL/min (ref >60)
Glucose, Bld: 101 mg/dL — ABNORMAL HIGH (ref 70–99)
Potassium: 3.7 mmol/L (ref 3.5–5.1)
Sodium: 135 mmol/L (ref 135–145)
Total Bilirubin: 0.5 mg/dL (ref 0.0–1.2)
Total Protein: 6.8 g/dL (ref 6.5–8.1)

## 2024-02-08 LAB — PROCALCITONIN: Procalcitonin: 0.12 ng/mL

## 2024-02-08 LAB — CBC WITH DIFFERENTIAL/PLATELET
Abs Immature Granulocytes: 0.05 10*3/uL (ref 0.00–0.07)
Basophils Absolute: 0.1 10*3/uL (ref 0.0–0.1)
Basophils Relative: 1 %
Eosinophils Absolute: 0.2 10*3/uL (ref 0.0–0.5)
Eosinophils Relative: 4 %
HCT: 32 % — ABNORMAL LOW (ref 39.0–52.0)
Hemoglobin: 10.3 g/dL — ABNORMAL LOW (ref 13.0–17.0)
Immature Granulocytes: 1 %
Lymphocytes Relative: 25 %
Lymphs Abs: 1.6 10*3/uL (ref 0.7–4.0)
MCH: 28.9 pg (ref 26.0–34.0)
MCHC: 32.2 g/dL (ref 30.0–36.0)
MCV: 89.9 fL (ref 80.0–100.0)
Monocytes Absolute: 0.6 10*3/uL (ref 0.1–1.0)
Monocytes Relative: 9 %
Neutro Abs: 3.7 10*3/uL (ref 1.7–7.7)
Neutrophils Relative %: 60 %
Platelets: 352 10*3/uL (ref 150–400)
RBC: 3.56 MIL/uL — ABNORMAL LOW (ref 4.22–5.81)
RDW: 14.7 % (ref 11.5–15.5)
WBC: 6.3 10*3/uL (ref 4.0–10.5)
nRBC: 0 % (ref 0.0–0.2)

## 2024-02-08 LAB — CORTISOL: Cortisol, Plasma: 10.5 ug/dL

## 2024-02-08 LAB — C-REACTIVE PROTEIN: CRP: 1.5 mg/dL — ABNORMAL HIGH (ref <1.0)

## 2024-02-08 LAB — MAGNESIUM: Magnesium: 1.7 mg/dL (ref 1.7–2.4)

## 2024-02-08 LAB — T4, FREE: Free T4: 1.03 ng/dL (ref 0.61–1.12)

## 2024-02-08 LAB — TSH: TSH: 3.042 u[IU]/mL (ref 0.350–4.500)

## 2024-02-08 MED ORDER — LACTATED RINGERS IV BOLUS
1000.0000 mL | Freq: Once | INTRAVENOUS | Status: AC
Start: 2024-02-08 — End: 2024-02-08
  Administered 2024-02-08: 1000 mL via INTRAVENOUS

## 2024-02-08 MED ORDER — IOHEXOL 350 MG/ML SOLN
75.0000 mL | Freq: Once | INTRAVENOUS | Status: AC | PRN
  Administered 2024-02-08: 75 mL via INTRAVENOUS

## 2024-02-08 MED ORDER — CEFAZOLIN SODIUM-DEXTROSE 2-4 GM/100ML-% IV SOLN
2.0000 g | Freq: Three times a day (TID) | INTRAVENOUS | Status: DC
  Administered 2024-02-08 – 2024-02-16 (×22): 2 g via INTRAVENOUS
  Filled 2024-02-08 (×23): qty 100

## 2024-02-08 MED ORDER — LACTATED RINGERS IV SOLN: INTRAVENOUS | Status: AC

## 2024-02-08 NOTE — Progress Notes (Addendum)
 PROGRESS NOTE                                                                                                                                                                                                             Patient Demographics:    Mark Hammond, is a 33 y.o. male, DOB - [birth year], ZOX:[id number]  Outpatient Primary MD for the patient is Patient, No Pcp Per    LOS - 8  Admit date - 01/31/2024    Chief Complaint  Patient presents with   Leg Swelling   Generalized Pain       Brief Narrative (HPI from H&P)   33 y.o. male with PMH significant for polysubstance abuse including alcohol, tobacco, cocaine , heroin, MDMA, marijuana, bipolar disorder, schizoaffective schizophrenia, 4/30 patient presented to ED with complaint of left foot pain, swelling, warmth.  Further workup suggestive of diffuse septic arthritis of the left hip, proximal femur along with left hip and possible pelvic abscess.  He is currently septic.  Transferred to my care on 02/04/2024.    Subjective:   Patient in bed, appears comfortable, denies any headache, no fever, no chest pain or pressure, no shortness of breath , no abdominal pain. No new focal weakness.   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  Currently on Vancomycin  and Rocephin , orthopedics, PCCM and ID following, sepsis pathophysiology considerably improved, CT pelvis done Case discussed with Dr. Julio Ohm, patient has a left thigh soft tissue infection, knee per Dr. Julio Ohm looks stable, no event drainage of his left thigh abscess by IR on 02/05/2024 cultures pending-prelim growing Staph , echo nonacute, continues to have left leg weakness despite pain improving will request neurology to evaluate as well.    HX of polysubstance abuse including ongoing alcohol abuse, ongoing cocaine , amphetamine, cannabis abuse.  Counseled to abstain from all.   Hypomagnesemia  and hypokalemia.  Replaced.    Anemia of chronic disease.  Monitor.  Type screen and check INR.  Left lower extremity weakness.  Mostly guarding from pain, seen by neurology, recent L-spine imaging outpatient stable.  Continue supportive care.  PT OT.  Outpatient neuro follow-up.  Hypotension.  Hydrate, midodrine, check baseline TSH and cortisol.  Does not appear septic or toxic at this time.  Condition - Extremely Guarded  Family Communication  :  None present  Code Status :  Full  Consults  :  Ortho, ID, PCCM, IR, neurology  PUD Prophylaxis :  PPI   Procedures  :     Left anterior thigh abscess aspiration by IR on 02/05/2024.    Echo -  1. Left ventricular ejection fraction, by estimation, is 60 to 65%. The left ventricle has normal function. The left ventricle has no regional wall motion abnormalities. Left ventricular diastolic parameters were normal.   2. Right ventricular systolic function is normal. The right ventricular size is normal. Tricuspid regurgitation signal is inadequate for assessing PA pressure.   3. The mitral valve is grossly normal. No evidence of mitral valve regurgitation. No evidence of mitral stenosis.   4. The aortic valve was not well visualized. There is mild calcification of the aortic valve. There is mild thickening of the aortic valve. Aortic valve regurgitation is not visualized. No aortic stenosis is present.   5. The inferior vena cava is normal in size with greater than 50% respiratory variability, suggesting right atrial pressure of 3 mmHg.   CT pelvis -  Left proximal thigh cellulitis with an intramuscular abscess  MRI - 1. Findings are highly suspicious for septic arthritis and osteomyelitis of the left hip and proximal left femur. The proximal extent of the pelvic involvement is not imaged. The sacroiliac joints are not included. 2. Diffuse edema and enhancement throughout the proximal left thigh musculature with multiple irregular  peripherally enhancing fluid collections anteriorly in the proximal left thigh, largest superficial and lateral to the proximal rectus femoris muscle, suspicious for abscesses. 3. Incomplete visualization of the proximal extension of inflammation into the pelvis along the iliopsoas and obturator internus muscles. 4. Asymmetric subcutaneous edema within the visualized left pelvis and left thigh. 5. Recommend further evaluation of the pelvis (with CT or MRI) to better evaluate the proximal extent of the infection.      Disposition Plan  :    Status is: Inpatient   DVT Prophylaxis  :    enoxaparin (LOVENOX) injection 40 mg Start: 02/07/24 1115 SCDs Start: 01/31/24 2147  Lab Results  Component Value Date   PLT 352 02/08/2024    Diet :  Diet Order             Diet regular Room service appropriate? Yes; Fluid consistency: Thin  Diet effective now                    Inpatient Medications  Scheduled Meds:  docusate sodium  200 mg Oral BID   enoxaparin (LOVENOX) injection  40 mg Subcutaneous Daily   ferrous sulfate   325 mg Oral Q breakfast   folic acid   1 mg Oral Daily   metroNIDAZOLE  500 mg Oral Q12H   midodrine  5 mg Oral TID WC   multivitamin with minerals  1 tablet Oral Daily   pantoprazole  40 mg Oral Daily   polyethylene glycol  17 g Oral BID   QUEtiapine   100 mg Oral QHS   thiamine   100 mg Oral Daily   Continuous Infusions:  cefTRIAXone  (ROCEPHIN )  IV 2 g (02/07/24 2120)   lactated ringers  100 mL/hr at 02/08/24 0731   vancomycin  1,000 mg (02/08/24 0606)   PRN Meds:.acetaminophen  **OR** acetaminophen , haloperidol  lactate, lip balm, LORazepam , melatonin, morphine  injection, naLOXone  (NARCAN )  injection, nicotine , ondansetron  (ZOFRAN ) IV, pneumococcal 20-valent conjugate vaccine    Objective:   Vitals:  02/07/24 1940 02/07/24 2018 02/08/24 0017 02/08/24 0614  BP: 116/70 116/70 (!) 89/52 (!) 88/49  Pulse: 84 88 90 78  Resp: 17  18   Temp:   98.5 F (36.9 C)  98 F (36.7 C)  TempSrc:   Oral Oral  SpO2:   99% 99%  Weight:    56.5 kg    Wt Readings from Last 3 Encounters:  02/08/24 56.5 kg  01/26/24 56.7 kg  01/26/24 56.7 kg     Intake/Output Summary (Last 24 hours) at 02/08/2024 0820 Last data filed at 02/08/2024 0600 Gross per 24 hour  Intake 480 ml  Output 2400 ml  Net -1920 ml     Physical Exam  Awake- alert, left knee and thigh laying and redness has improved, left hip and leg weakness as compared to the right [place].AT,PERRAL Supple Neck, No JVD,   Symmetrical Chest wall movement, Good air movement bilaterally, CTAB RRR,No Gallops,Rubs or new Murmurs,  +ve B.Sounds, Abd Soft, No tenderness,        Data Review:    Recent Labs  Lab 02/04/24 0632 02/05/24 0019 02/06/24 0749 02/07/24 0445 02/08/24 0506  WBC 4.1 4.5 3.5* 4.2 6.3  HGB 8.9* 9.3* 9.6* 9.5* 10.3*  HCT 27.7* 28.2* 29.3* 30.0* 32.0*  PLT 214 241 259 316 352  MCV 89.1 89.0 88.3 89.0 89.9  MCH 28.6 29.3 28.9 28.2 28.9  MCHC 32.1 33.0 32.8 31.7 32.2  RDW 14.6 14.2 14.4 14.6 14.7  LYMPHSABS 1.1 1.2 1.1 1.4 1.6  MONOABS 0.6 0.6 0.4 0.4 0.6  EOSABS 0.1 0.2 0.3 0.3 0.2  BASOSABS 0.0 0.0 0.0 0.0 0.1    Recent Labs  Lab 02/02/24 0313 02/03/24 0324 02/04/24 6213 02/04/24 0916 02/04/24 1147 02/05/24 0019 02/06/24 0418 02/06/24 0749 02/07/24 0445 02/08/24 0506  NA 133* 135 133*  --   --  135  --  136 136 135  K 3.4* 3.7 3.2*  --   --  3.2*  --  3.3* 3.8 3.7  CL 97* 100 97*  --   --  101  --  104 103 100  CO2 27 24 26   --   --  26  --  25 26 23   ANIONGAP 9 11 10   --   --  8  --  7 7 12   GLUCOSE 103* 99 109*  --   --  103*  --  101* 104* 101*  BUN <5* 6 <5*  --   --  <5*  --  <5* <5* <5*  CREATININE 0.42* 0.56* 0.55*  --   --  0.51*  --  0.52* 0.61 0.53*  AST 87* 68* 143*  --   --  119*  --  108* 115* 91*  ALT 47* 40 61*  --   --  60*  --  58* 63* 56*  ALKPHOS 107 86 99  --   --  92  --  110 132* 115  BILITOT 1.2 0.8 0.8  --   --  0.7  --  0.6 0.4 0.5   ALBUMIN 2.4* 2.1* 1.9*  --   --  1.8*  --  2.0* 2.1* 2.3*  CRP  --   --  10.6*  --   --  8.5* 5.0*  --  3.0* 1.5*  PROCALCITON  --   --  0.27  --   --  0.27  --  0.32 0.28 0.12  LATICACIDVEN  --   --   --  1.1 1.2  --   --   --   --   --  INR  --   --  1.2  --   --   --   --   --   --   --   TSH  --   --   --   --   --   --   --   --  2.633  --   MG 1.5* 1.7 1.6*  --   --  1.6*  --  1.7 1.6* 1.7  PHOS 2.8 3.9 3.4  --   --   --   --   --   --   --   CALCIUM 8.0* 8.1* 7.7*  --   --  7.7*  --  7.8* 8.4* 8.5*      Recent Labs  Lab 02/04/24 1610 02/04/24 0916 02/04/24 1147 02/05/24 0019 02/06/24 0418 02/06/24 0749 02/07/24 0445 02/08/24 0506  CRP 10.6*  --   --  8.5* 5.0*  --  3.0* 1.5*  PROCALCITON 0.27  --   --  0.27  --  0.32 0.28 0.12  LATICACIDVEN  --  1.1 1.2  --   --   --   --   --   INR 1.2  --   --   --   --   --   --   --   TSH  --   --   --   --   --   --  2.633  --   MG 1.6*  --   --  1.6*  --  1.7 1.6* 1.7  CALCIUM 7.7*  --   --  7.7*  --  7.8* 8.4* 8.5*    --------------------------------------------------------------------------------------------------------------- Lab Results  Component Value Date   CHOL 96 12/08/2023   HDL 34 (L) 12/08/2023   LDLCALC 51 12/08/2023   TRIG 55 12/08/2023   CHOLHDL 2.8 12/08/2023    Lab Results  Component Value Date   HGBA1C 6.0 (H) 12/08/2023   Recent Labs    02/07/24 0445  TSH 2.633  FREET4 1.17*   No results for input(s): "VITAMINB12", "FOLATE", "FERRITIN", "TIBC", "IRON", "RETICCTPCT" in the last 72 hours. ------------------------------------------------------------------------------------------------------------------ Cardiac Enzymes No results for input(s): "CKMB", "TROPONINI", "MYOGLOBIN" in the last 168 hours.  Invalid input(s): "CK"  Micro Results Recent Results (from the past 240 hours)  Blood culture (routine x 2)     Status: None   Collection Time: 01/31/24  8:00 PM   Specimen: BLOOD  Result Value  Ref Range Status   Specimen Description   Final    BLOOD RIGHT ANTECUBITAL Performed at [facility], [address] [place], [place] [address]    Special Requests   Final    BOTTLES DRAWN AEROBIC AND ANAEROBIC Blood Culture results may not be optimal due to an inadequate volume of blood received in culture bottles Performed at [facility], [address] [place], [place] [address]    Culture   Final    NO GROWTH 5 DAYS Performed at [facility], [address], [place], [place] [address]    Report Status 02/05/2024 FINAL  Final  Blood culture (routine x 2)     Status: None   Collection Time: 02/01/24  6:40 PM   Specimen: BLOOD LEFT ARM  Result Value Ref Range Status   Specimen Description   Final    BLOOD LEFT ARM Performed at [facility], [address], [place], [place] [address]    Special Requests   Final  BOTTLES DRAWN AEROBIC AND ANAEROBIC Blood Culture adequate volume Performed at [facility], [address], [place], [place] [address]    Culture   Final    NO GROWTH 5 DAYS Performed at [facility], [address] [place], [place] [address]    Report Status 02/06/2024 FINAL  Final  MRSA Next Gen by PCR, Nasal     Status: None   Collection Time: 02/01/24  7:05 PM   Specimen: Nasal Mucosa; Nasal Swab  Result Value Ref Range Status   MRSA by PCR Next Gen NOT DETECTED NOT DETECTED Final    Comment: (NOTE) The GeneXpert MRSA Assay (FDA approved for NASAL specimens only), is one component of a comprehensive MRSA colonization surveillance program. It is not intended to diagnose MRSA infection nor to guide or monitor treatment for MRSA infections. Test performance is not FDA approved in patients less than 80 years old. Performed at [facility], [address], [place], [place] [address]   Aerobic/Anaerobic Culture w Gram Stain (surgical/deep wound)     Status: None  (Preliminary result)   Collection Time: 02/05/24  9:31 AM   Specimen: Abscess  Result Value Ref Range Status   Specimen Description ABSCESS  Final   Special Requests NONE  Final   Gram Stain   Final    ABUNDANT WBC PRESENT, PREDOMINANTLY PMN NO ORGANISMS SEEN Performed at [facility], [address], [place], [place] [address]    Culture   Final    RARE STAPHYLOCOCCUS AUREUS SUSCEPTIBILITIES TO FOLLOW NO ANAEROBES ISOLATED; CULTURE IN PROGRESS FOR 5 DAYS    Report Status PENDING  Incomplete    Radiology Report No results found.    Signature  -   Lynnwood Sauer M.D on 02/08/2024 at 8:20 AM   -  To page go to [email or web address]

## 2024-02-08 NOTE — Plan of Care (Signed)
 Pt has rested quietly throughout the night with no distress noted. Alert and oriented. On room air. SR on the monitor. Voids per urinal. Medicated for pain once with relief noted. Medicated for anxiety once with relief noted. No other complaints voiced.     Problem: Education: Goal: Knowledge of General Education information will improve Description: Including pain rating scale, medication(s)/side effects and non-pharmacologic comfort measures Outcome: Progressing   Problem: Clinical Measurements: Goal: Respiratory complications will improve Outcome: Progressing Goal: Cardiovascular complication will be avoided Outcome: Progressing   Problem: Pain Managment: Goal: General experience of comfort will improve and/or be controlled Outcome: Progressing

## 2024-02-08 NOTE — Progress Notes (Signed)
 Physical Therapy Treatment Patient Details Name: Mark Hammond MRN: [id number] DOB: 04/20/1991 Today's Date: 02/08/2024   History of Present Illness Pt is 33 yo presenting to [facility] ED on 4/30 due to L leg pain for several months. MRI of lumbar spine was negative. 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 Drainage of his left thigh abscess by IR on 02/05/2024 Pmh: Alcoholic gastritis, BPD, Depression, drug abuse, ETOH abuse, hallucination, schizo affective schizophrenia.    PT Comments  Tolerated treatment well, motivated to improve and mobilize. Reviewed RW use for gait, showing good carry over with sequencing from last visit. Still tolerating minimal WB through LLE and cannot place heel on ground. CGA for safety with gait, no loss of balance, distance up to 75 feet this date. Reviewed LE exercises. Patient will continue to benefit from skilled physical therapy services to further improve independence with functional mobility.     If plan is discharge home, recommend the following: Assist for transportation;Supervision due to cognitive status;A little help with walking and/or transfers;A little help with bathing/dressing/bathroom   Can travel by private Theme park manager cushion (measurements PT);Wheelchair (measurements PT) (pending progress)    Recommendations for Other Services       Precautions / Restrictions Precautions Precautions: Fall Recall of Precautions/Restrictions: Intact Restrictions Weight Bearing Restrictions Per Provider Order: No     Mobility  Bed Mobility Overal bed mobility: Needs Assistance Bed Mobility: Supine to Sit     Supine to sit: Min assist Sit to supine: Min assist   General bed mobility comments: Min assist to help with LLE movement. Pulls through therapist's hand to rise to EOB.     Transfers Overall transfer level: Needs assistance Equipment used: Rolling walker (2 wheels) Transfers: Sit to/from Stand Sit to Stand: Contact guard assist           General transfer comment: CGA for safety from bed, supervision from toilet with rail. RW for support upon standing, only tolerated TTWB on LLE, cannot lower heel to floor.    Ambulation/Gait Ambulation/Gait assistance: Contact guard assist Gait Distance (Feet): 75 Feet Assistive device: Rolling walker (2 wheels) Gait Pattern/deviations: Step-to pattern, Decreased stance time - left, Decreased stride length, Decreased dorsiflexion - left, Decreased weight shift to left, Antalgic Gait velocity: dec Gait velocity interpretation: <1.31 ft/sec, indicative of household ambulator   General Gait Details: Good recall of sequencing taught prior visit. Still antalgic with minimal WB tolerated through LLE. Unable to place heel on ground. Cues for technique and RW placement to maximize safety. RW a bit low but pt prefers this height.   Stairs             Wheelchair Mobility     Tilt Bed    Modified Rankin (Stroke Patients Only)       Balance Overall balance assessment: Needs assistance Sitting-balance support: Feet supported Sitting balance-Leahy Scale: Fair   Postural control: Right lateral lean Standing balance support: Bilateral upper extremity supported Standing balance-Leahy Scale: Poor Standing balance comment: Pt requries external support                            Communication Communication Communication: No apparent difficulties  Cognition Arousal: Alert Behavior During Therapy: WFL for tasks assessed/performed   PT - Cognitive impairments:  No apparent impairments                         Following commands: Impaired Following commands impaired: Follows multi-step commands inconsistently    Cueing Cueing Techniques: Verbal cues, Gestural cues  Exercises Other  Exercises Other Exercises: Calf stretch with bed sheet 3x30" knee extended. Quad sets x 10    General Comments General comments (skin integrity, edema, etc.): VSS      Pertinent Vitals/Pain Pain Assessment Pain Assessment: Faces Faces Pain Scale: Hurts even more Pain Location: LLE with movement Pain Descriptors / Indicators: Grimacing, Guarding, Aching Pain Intervention(s): Limited activity within patient's tolerance, Monitored during session, Repositioned    Home Living                          Prior Function            PT Goals (current goals can now be found in the care plan section) Acute Rehab PT Goals Patient Stated Goal: Get well PT Goal Formulation: With patient Time For Goal Achievement: 02/18/24 Potential to Achieve Goals: Fair Progress towards PT goals: Progressing toward goals    Frequency    Min 2X/week      PT Plan      Co-evaluation              AM-PAC PT "6 Clicks" Mobility   Outcome Measure  Help needed turning from your back to your side while in a flat bed without using bedrails?: A Little Help needed moving from lying on your back to sitting on the side of a flat bed without using bedrails?: A Little Help needed moving to and from a bed to a chair (including a wheelchair)?: A Little Help needed standing up from a chair using your arms (e.g., wheelchair or bedside chair)?: A Little Help needed to walk in hospital room?: A Little Help needed climbing 3-5 steps with a railing? : Total 6 Click Score: 16    End of Session Equipment Utilized During Treatment: Gait belt Activity Tolerance: Patient tolerated treatment well Patient left: in bed;with call bell/phone within reach;with bed alarm set Nurse Communication: Mobility status PT Visit Diagnosis: Unsteadiness on feet (R26.81);Other abnormalities of gait and mobility (R26.89);Pain Pain - Right/Left: Left Pain - part of body: Hip;Knee     Time: 7253-6644 PT Time  Calculation (min) (ACUTE ONLY): 29 min  Charges:    $Gait Training: 8-22 mins $Therapeutic Activity: 8-22 mins PT General Charges $$ ACUTE PT VISIT: 1 Visit                     Jory Ng, PT, DPT [facility]  Rehabilitation Services Physical Therapist Office: [phone] Website: [email or web address]    Alinda Irani 02/08/2024, 3:16 PM

## 2024-02-08 NOTE — Progress Notes (Signed)
 Pharmacy Antibiotic Note  Mark Hammond is a 33 y.o. male admitted on 01/31/2024 with cellulitis.  MRI femur highly suspicious for septic arthritis and osteomyelitis of the left hip and proximal left femur. Also suspicious for multiple abscesses. Pharmacy has been consulted for vancomycin  dosing.  Leg abscess is growing out staph aureus with sens pending. Vanc trough came back at 15. Will cont vanc for now. If sens come back MRSA, will likely transition to daptomycin.    Plan: Cont vancomycin  1g IV Q8H (eAUC 505, Scr < 0.8) Follow renal function,cultures and clinical course  Weight: 56.5 kg (124 lb 9 oz)  Temp (24hrs), Avg:98.4 F (36.9 C), Min:98 F (36.7 C), Max:98.9 F (37.2 C)  Recent Labs  Lab 02/04/24 0632 02/04/24 0916 02/04/24 1147 02/05/24 0019 02/06/24 0005 02/06/24 0227 02/06/24 0749 02/06/24 1038 02/07/24 0445 02/07/24 2217 02/08/24 0506  WBC 4.1  --   --  4.5  --   --  3.5*  --  4.2  --  6.3  CREATININE 0.55*  --   --  0.51*  --   --  0.52*  --  0.61  --  0.53*  LATICACIDVEN  --  1.1 1.2  --   --   --   --   --   --   --   --   VANCOTROUGH  --   --   --   --   --   --   --  6*  --  15  --   VANCOPEAK  --   --   --   --  36 22*  --   --   --   --   --     Estimated Creatinine Clearance: 105.9 mL/min (A) (by C-G formula based on SCr of 0.53 mg/dL (L)).    Allergies  Allergen Reactions   Codeine Other (See Comments)    Patient states parents told him he was allergic at a young age.   Fish Allergy Other (See Comments)    Patient does not wish to eat ANY fish- reaction unclear   Shellfish Allergy Other (See Comments)    Patient does not wish to eat ANY shellfish- reaction unclear    Antimicrobials this admission: 4/30 cefepime  x 1 4/30 vancomycin  > 5/1 ceftriaxone  > 5/4 Zosyn x1 5/4 flagyl>>  Dose adjustments this admission: 5/6 AUC 338, below goal range. On vancomycin  1 G IV q12 hours  Microbiology results: 4/30 blood: ngF 5/1 MRSA PCR: neg 5/5  surgical (L leg abscess): SA 5/5 hep C RNA+  Ivery Marking, PharmD, BCIDP, AAHIVP, CPP Infectious Disease Pharmacist 02/08/2024 9:29 AM

## 2024-02-09 DIAGNOSIS — L03116 Cellulitis of left lower limb: Secondary | ICD-10-CM | POA: Diagnosis not present

## 2024-02-09 LAB — CBC WITH DIFFERENTIAL/PLATELET
Abs Immature Granulocytes: 0.06 10*3/uL (ref 0.00–0.07)
Basophils Absolute: 0.1 10*3/uL (ref 0.0–0.1)
Basophils Relative: 1 %
Eosinophils Absolute: 0.2 10*3/uL (ref 0.0–0.5)
Eosinophils Relative: 3 %
HCT: 30.2 % — ABNORMAL LOW (ref 39.0–52.0)
Hemoglobin: 9.8 g/dL — ABNORMAL LOW (ref 13.0–17.0)
Immature Granulocytes: 1 %
Lymphocytes Relative: 29 %
Lymphs Abs: 2.1 10*3/uL (ref 0.7–4.0)
MCH: 29.2 pg (ref 26.0–34.0)
MCHC: 32.5 g/dL (ref 30.0–36.0)
MCV: 89.9 fL (ref 80.0–100.0)
Monocytes Absolute: 0.7 10*3/uL (ref 0.1–1.0)
Monocytes Relative: 10 %
Neutro Abs: 4 10*3/uL (ref 1.7–7.7)
Neutrophils Relative %: 56 %
Platelets: 332 10*3/uL (ref 150–400)
RBC: 3.36 MIL/uL — ABNORMAL LOW (ref 4.22–5.81)
RDW: 15.2 % (ref 11.5–15.5)
WBC: 7.1 10*3/uL (ref 4.0–10.5)
nRBC: 0 % (ref 0.0–0.2)

## 2024-02-09 LAB — URINALYSIS, W/ REFLEX TO CULTURE (INFECTION SUSPECTED)
Bacteria, UA: NONE SEEN
Bilirubin Urine: NEGATIVE
Glucose, UA: NEGATIVE mg/dL
Hgb urine dipstick: NEGATIVE
Ketones, ur: NEGATIVE mg/dL
Leukocytes,Ua: NEGATIVE
Nitrite: NEGATIVE
Protein, ur: NEGATIVE mg/dL
Specific Gravity, Urine: 1.008 (ref 1.005–1.030)
pH: 6 (ref 5.0–8.0)

## 2024-02-09 LAB — COMPREHENSIVE METABOLIC PANEL WITH GFR
ALT: 49 U/L — ABNORMAL HIGH (ref 0–44)
AST: 80 U/L — ABNORMAL HIGH (ref 15–41)
Albumin: 2.2 g/dL — ABNORMAL LOW (ref 3.5–5.0)
Alkaline Phosphatase: 117 U/L (ref 38–126)
Anion gap: 11 (ref 5–15)
BUN: <5 mg/dL — ABNORMAL LOW (ref 6–20)
CO2: 24 mmol/L (ref 22–32)
Calcium: 8.5 mg/dL — ABNORMAL LOW (ref 8.9–10.3)
Chloride: 100 mmol/L (ref 98–111)
Creatinine, Ser: 0.61 mg/dL (ref 0.61–1.24)
GFR, Estimated: >60 mL/min (ref >60)
Glucose, Bld: 100 mg/dL — ABNORMAL HIGH (ref 70–99)
Potassium: 3.4 mmol/L — ABNORMAL LOW (ref 3.5–5.1)
Sodium: 135 mmol/L (ref 135–145)
Total Bilirubin: 0.4 mg/dL (ref 0.0–1.2)
Total Protein: 6.5 g/dL (ref 6.5–8.1)

## 2024-02-09 LAB — PROCALCITONIN: Procalcitonin: <0.1 ng/mL

## 2024-02-09 MED ORDER — LACTATED RINGERS IV SOLN: INTRAVENOUS | Status: AC

## 2024-02-09 MED ORDER — POTASSIUM CHLORIDE CRYS ER 20 MEQ PO TBCR
40.0000 meq | EXTENDED_RELEASE_TABLET | Freq: Two times a day (BID) | ORAL | Status: AC
  Administered 2024-02-09 (×2): 40 meq via ORAL
  Filled 2024-02-09 (×2): qty 2

## 2024-02-09 NOTE — Progress Notes (Signed)
 PROGRESS NOTE                                                                                                                                                                                                             Patient Demographics:    Mark Hammond, is a 33 y.o. male, DOB - [birth year], VWU:[id number]  Outpatient Primary MD for the patient is Patient, No Pcp Per    LOS - 9  Admit date - 01/31/2024    Chief Complaint  Patient presents with   Leg Swelling   Generalized Pain       Brief Narrative (HPI from H&P)   33 y.o. male with PMH significant for polysubstance abuse including alcohol, tobacco, cocaine , heroin, MDMA, marijuana, bipolar disorder, schizoaffective schizophrenia, 4/30 patient presented to ED with complaint of left foot pain, swelling, warmth.  Further workup suggestive of diffuse septic arthritis of the left hip, proximal femur along with left hip and possible pelvic abscess.  He is currently septic.  Transferred to my care on 02/04/2024.    Subjective:   Patient in bed, appears comfortable, denies any headache, no fever, no chest pain or pressure, no shortness of breath , no abdominal pain. No new focal weakness.   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  Currently on Vancomycin  and Rocephin , orthopedics, PCCM and ID following, sepsis pathophysiology considerably improved, CT pelvis done Case discussed with Dr. Julio Ohm, patient has a left thigh soft tissue infection, knee per Dr. Julio Ohm looks stable, no event drainage of his left thigh abscess by IR on 02/05/2024 wound abscess culture growing MSSA, echo nonacute, continues to have left leg weakness despite pain improving will request neurology to evaluate as well.    ID on board, due to his underlying osteomyelitis which is chronic patient has been placed on 6 weeks of IV cefazolin  with stop date on 03/17/2024, ID appointment  on 02/01/2024, poor candidate for PICC line as he is homeless and history of drug abuse.  HX of polysubstance abuse including ongoing alcohol abuse, ongoing cocaine , amphetamine, cannabis abuse.  Counseled to abstain from all.  On CIWA protocol.   Hypomagnesemia and hypokalemia.  Replaced.    Anemia of chronic disease.  Monitor.  Type screen and check INR.  Left lower extremity weakness.  Mostly guarding from pain, seen by neurology, recent L-spine imaging outpatient stable.  Continue supportive care.  PT OT.  Outpatient neuro follow-up.       Condition - Extremely Guarded  Family Communication  :  None present  Code Status :  Full  Consults  :  Ortho, ID, PCCM, IR, neurology  PUD Prophylaxis :  PPI   Procedures  :     Left anterior thigh abscess aspiration by IR on 02/05/2024.    Echo -  1. Left ventricular ejection fraction, by estimation, is 60 to 65%. The left ventricle has normal function. The left ventricle has no regional wall motion abnormalities. Left ventricular diastolic parameters were normal.   2. Right ventricular systolic function is normal. The right ventricular size is normal. Tricuspid regurgitation signal is inadequate for assessing PA pressure.   3. The mitral valve is grossly normal. No evidence of mitral valve regurgitation. No evidence of mitral stenosis.   4. The aortic valve was not well visualized. There is mild calcification of the aortic valve. There is mild thickening of the aortic valve. Aortic valve regurgitation is not visualized. No aortic stenosis is present.   5. The inferior vena cava is normal in size with greater than 50% respiratory variability, suggesting right atrial pressure of 3 mmHg.   CT pelvis -  Left proximal thigh cellulitis with an intramuscular abscess  MRI - 1. Findings are highly suspicious for septic arthritis and osteomyelitis of the left hip and proximal left femur. The proximal extent of the pelvic involvement is not imaged. The  sacroiliac joints are not included. 2. Diffuse edema and enhancement throughout the proximal left thigh musculature with multiple irregular peripherally enhancing fluid collections anteriorly in the proximal left thigh, largest superficial and lateral to the proximal rectus femoris muscle, suspicious for abscesses. 3. Incomplete visualization of the proximal extension of inflammation into the pelvis along the iliopsoas and obturator internus muscles. 4. Asymmetric subcutaneous edema within the visualized left pelvis and left thigh. 5. Recommend further evaluation of the pelvis (with CT or MRI) to better evaluate the proximal extent of the infection.      Disposition Plan  :    Status is: Inpatient   DVT Prophylaxis  :    enoxaparin  (LOVENOX ) injection 40 mg Start: 02/07/24 1115 SCDs Start: 01/31/24 2147  Lab Results  Component Value Date   PLT 332 02/09/2024    Diet :  Diet Order             Diet regular Room service appropriate? Yes; Fluid consistency: Thin  Diet effective now                    Inpatient Medications  Scheduled Meds:  docusate sodium   200 mg Oral BID   enoxaparin  (LOVENOX ) injection  40 mg Subcutaneous Daily   ferrous sulfate   325 mg Oral Q breakfast   folic acid   1 mg Oral Daily   midodrine   5 mg Oral TID WC   multivitamin with minerals  1 tablet Oral Daily   pantoprazole   40 mg Oral Daily   polyethylene glycol  17 g Oral BID   potassium chloride   40 mEq Oral BID   QUEtiapine   100 mg Oral QHS   thiamine   100 mg Oral Daily   Continuous Infusions:   ceFAZolin  (ANCEF ) IV 2 g (02/09/24 0525)   PRN Meds:.acetaminophen  **OR** acetaminophen , haloperidol  lactate, lip balm, LORazepam , melatonin, morphine  injection, naLOXone  (NARCAN )  injection, nicotine , ondansetron  (  ZOFRAN ) IV, pneumococcal 20-valent conjugate vaccine    Objective:   Vitals:   02/09/24 0200 02/09/24 0300 02/09/24 0318 02/09/24 0558  BP:   (!) 92/52   Pulse:      Resp: 18 18 18 16    Temp:   98.3 F (36.8 C)   TempSrc:   Oral   SpO2:   99%   Weight:    48.2 kg    Wt Readings from Last 3 Encounters:  02/09/24 48.2 kg  01/26/24 56.7 kg  01/26/24 56.7 kg     Intake/Output Summary (Last 24 hours) at 02/09/2024 1010 Last data filed at 02/09/2024 0529 Gross per 24 hour  Intake 600 ml  Output 2251 ml  Net -1651 ml     Physical Exam  Awake- alert, left knee and thigh laying and redness has improved, left hip and leg weakness as compared to the right [place].AT,PERRAL Supple Neck, No JVD,   Symmetrical Chest wall movement, Good air movement bilaterally, CTAB RRR,No Gallops,Rubs or new Murmurs,  +ve B.Sounds, Abd Soft, No tenderness,        Data Review:    Recent Labs  Lab 02/05/24 0019 02/06/24 0749 02/07/24 0445 02/08/24 0506 02/09/24 0634  WBC 4.5 3.5* 4.2 6.3 7.1  HGB 9.3* 9.6* 9.5* 10.3* 9.8*  HCT 28.2* 29.3* 30.0* 32.0* 30.2*  PLT 241 259 316 352 332  MCV 89.0 88.3 89.0 89.9 89.9  MCH 29.3 28.9 28.2 28.9 29.2  MCHC 33.0 32.8 31.7 32.2 32.5  RDW 14.2 14.4 14.6 14.7 15.2  LYMPHSABS 1.2 1.1 1.4 1.6 2.1  MONOABS 0.6 0.4 0.4 0.6 0.7  EOSABS 0.2 0.3 0.3 0.2 0.2  BASOSABS 0.0 0.0 0.0 0.1 0.1    Recent Labs  Lab 02/03/24 0324 02/04/24 8657 02/04/24 0916 02/04/24 1147 02/05/24 0019 02/06/24 0418 02/06/24 0749 02/07/24 0445 02/08/24 0506 02/08/24 0822 02/09/24 0634  NA 135 133*  --   --  135  --  136 136 135  --  135  K 3.7 3.2*  --   --  3.2*  --  3.3* 3.8 3.7  --  3.4*  CL 100 97*  --   --  101  --  104 103 100  --  100  CO2 24 26  --   --  26  --  25 26 23   --  24  ANIONGAP 11 10  --   --  8  --  7 7 12   --  11  GLUCOSE 99 109*  --   --  103*  --  101* 104* 101*  --  100*  BUN 6 <5*  --   --  <5*  --  <5* <5* <5*  --  <5*  CREATININE 0.56* 0.55*  --   --  0.51*  --  0.52* 0.61 0.53*  --  0.61  AST 68* 143*  --   --  119*  --  108* 115* 91*  --  80*  ALT 40 61*  --   --  60*  --  58* 63* 56*  --  49*  ALKPHOS 86 99  --   --  92  --   110 132* 115  --  117  BILITOT 0.8 0.8  --   --  0.7  --  0.6 0.4 0.5  --  0.4  ALBUMIN 2.1* 1.9*  --   --  1.8*  --  2.0* 2.1* 2.3*  --  2.2*  CRP  --  10.6*  --   --  8.5* 5.0*  --  3.0* 1.5*  --   --   PROCALCITON  --  0.27  --   --  0.27  --  0.32 0.28 0.12  --   --   LATICACIDVEN  --   --  1.1 1.2  --   --   --   --   --   --   --   INR  --  1.2  --   --   --   --   --   --   --   --   --   TSH  --   --   --   --   --   --   --  2.633  --  3.042  --   MG 1.7 1.6*  --   --  1.6*  --  1.7 1.6* 1.7  --   --   PHOS 3.9 3.4  --   --   --   --   --   --   --   --   --   CALCIUM 8.1* 7.7*  --   --  7.7*  --  7.8* 8.4* 8.5*  --  8.5*      Recent Labs  Lab 02/04/24 0632 02/04/24 0916 02/04/24 1147 02/05/24 0019 02/06/24 0418 02/06/24 0749 02/07/24 0445 02/08/24 0506 02/08/24 0822 02/09/24 0634  CRP 10.6*  --   --  8.5* 5.0*  --  3.0* 1.5*  --   --   PROCALCITON 0.27  --   --  0.27  --  0.32 0.28 0.12  --   --   LATICACIDVEN  --  1.1 1.2  --   --   --   --   --   --   --   INR 1.2  --   --   --   --   --   --   --   --   --   TSH  --   --   --   --   --   --  2.633  --  3.042  --   MG 1.6*  --   --  1.6*  --  1.7 1.6* 1.7  --   --   CALCIUM 7.7*  --   --  7.7*  --  7.8* 8.4* 8.5*  --  8.5*    --------------------------------------------------------------------------------------------------------------- Lab Results  Component Value Date   CHOL 96 12/08/2023   HDL 34 (L) 12/08/2023   LDLCALC 51 12/08/2023   TRIG 55 12/08/2023   CHOLHDL 2.8 12/08/2023    Lab Results  Component Value Date   HGBA1C 6.0 (H) 12/08/2023   Recent Labs    02/08/24 0822 02/08/24 1056  TSH 3.042  --   FREET4  --  1.03   No results for input(s): "VITAMINB12", "FOLATE", "FERRITIN", "TIBC", "IRON", "RETICCTPCT" in the last 72 hours. ------------------------------------------------------------------------------------------------------------------ Cardiac Enzymes No results for input(s): "CKMB",  "TROPONINI", "MYOGLOBIN" in the last 168 hours.  Invalid input(s): "CK"    Radiology Report CT FEMUR LEFT W CONTRAST Result Date: 02/08/2024 CLINICAL DATA:  Soft tissue infection suspected, thigh, no prior imaging EXAM: CT OF THE LOWER LEFT EXTREMITY WITH CONTRAST TECHNIQUE: Multidetector CT imaging of the lower left extremity was performed according to the standard protocol following intravenous contrast administration. RADIATION DOSE REDUCTION: This exam was performed according to the departmental dose-optimization program which includes automated exposure control, adjustment of  the mA and/or kV according to patient size and/or use of iterative reconstruction technique. CONTRAST:  75mL OMNIPAQUE  IOHEXOL  350 MG/ML SOLN COMPARISON:  Prior MRI 02/03/2024 and CT 02/04/2024. FINDINGS: Bones/Joint/Cartilage Again noted are changes of septic arthritis and osteomyelitis within the left hip, acetabulum, and proximal left femur, unchanged since prior MRI and CT. Ligaments Suboptimally assessed by CT. Muscles and Tendons Decreasing size of the intramuscular fluid collection in the upper left rectus femoris muscle now measuring 2.1 x 1.0 cm compared to 4.1 x 2.7 cm on prior CT. No visible extension into the pelvis. Soft tissues As above IMPRESSION: Continued changes of osteomyelitis and septic arthritis surrounded the left hip and involving the acetabula and proximal left femur. Intramuscular abscess in the upper rectus femorals muscle has decreased in size, now 2.1 x 1.0 cm compared to 4.1 x 2.7 cm previously. Electronically Signed   By: Janeece Mechanic M.D.   On: 02/08/2024 18:42   DG Chest Port 1 View Result Date: 02/08/2024 CLINICAL DATA:  Shortness of breath. EXAM: PORTABLE CHEST 1 VIEW COMPARISON:  AP chest 02/04/2024, CT chest, abdomen, and pelvis 05/28/2023 FINDINGS: Cardiac silhouette and mediastinal contours are within limits. The lungs are clear. No pleural effusion pneumothorax. Minimal dextrocurvature of the  upper thoracic spine, unchanged from prior. IMPRESSION: No acute cardiopulmonary disease process. Electronically Signed   By: Bertina Broccoli M.D.   On: 02/08/2024 14:25     Signature  -   Lynnwood Sauer M.D on 02/09/2024 at 10:10 AM   -  To page go to [email or web address]

## 2024-02-09 NOTE — TOC CM/SW Note (Signed)
 Transition of Care [facility]) - Inpatient Brief Assessment   Patient Details  Name: Mark Hammond MRN: [id number] Date of Birth: June 29, 1991  [facility]) CM/SW Contact:    Jannice Mends, LCSW Phone Number: 02/09/2024, 4:26 PM   Clinical Narrative: CSW met with patient to provide community housing and substance use resources. Patient very pleasant and he shared he had been staying at a church prior to admission but he is not comfortable returning due to it being a drug environment that is tempting for him. There is another church [place] that he is going to look into that he used to go to that may be better. He will require assistance with transport at discharge as he usually uses the bus when he can get a pass.    Transition of Care Asessment: Insurance and Status: Insurance coverage has been reviewed Patient has primary care physician: Yes Home environment has been reviewed: home Prior level of function:: independent Prior/Current Home Services: No current home services Social Drivers of Health Review: SDOH reviewed no interventions necessary Readmission risk has been reviewed: Yes Transition of care needs: transition of care needs identified, TOC will continue to follow

## 2024-02-09 NOTE — Progress Notes (Signed)
 Physical Therapy Treatment Patient Details Name: Mark Hammond MRN: [id number] DOB: 1991/07/02 Today's Date: 02/09/2024   History of Present Illness Pt is 33 yo presenting to [facility] ED on 4/30 due to L leg pain for several months. MRI of lumbar spine was negative. 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 Drainage of his left thigh abscess by IR on 02/05/2024 Pmh: Alcoholic gastritis, BPD, Depression, drug abuse, ETOH abuse, hallucination, schizo affective schizophrenia.    PT Comments   Patient is progressing well towards physical therapy goals, ambulating up to 100 feet this afternoon. Still unable to fully bear weight through LLE but managing RW well at a supervision level today. Still requires  up to min assist for bed mobility due to pain and guarding. Stable with transfers while using RW. Patient will continue to benefit from skilled physical therapy services to further improve independence with functional mobility.      If plan is discharge home, recommend the following: Assist for transportation;Supervision due to cognitive status;A little help with walking and/or transfers;A little help with bathing/dressing/bathroom   Can travel by private Theme park manager cushion (measurements PT);Wheelchair (measurements PT) (pending progress)    Recommendations for Other Services       Precautions / Restrictions Precautions Precautions: Fall Recall of Precautions/Restrictions: Intact Restrictions Weight Bearing Restrictions Per Provider Order: No     Mobility  Bed Mobility Overal bed mobility: Needs Assistance Bed Mobility: Supine to Sit     Supine to sit: Contact guard Sit to supine: Min assist   General bed mobility comments: Able to rise with CGA, very slow and guarded. Min assist for BIL LEs into bed, guarded by  pain.    Transfers Overall transfer level: Needs assistance Equipment used: Rolling walker (2 wheels) Transfers: Sit to/from Stand Sit to Stand: Supervision           General transfer comment: Supervision for safety, minimal WB through LLE. Stable upon standing with RW for support.    Ambulation/Gait Ambulation/Gait assistance: Supervision Gait Distance (Feet): 100 Feet Assistive device: Rolling walker (2 wheels) Gait Pattern/deviations: Step-to pattern, Decreased stance time - left, Decreased stride length, Decreased dorsiflexion - left, Decreased weight shift to left, Antalgic Gait velocity: dec Gait velocity interpretation: <1.31 ft/sec, indicative of household ambulator   General Gait Details: Lightly touching left forefoot to ground. Good recall of sequencing. Supervision for safety without overt LOB. Slow and guarded throughout. Cues for safety and step placement to maximize support within RW.   Stairs             Wheelchair Mobility     Tilt Bed    Modified Rankin (Stroke Patients Only)       Balance Overall balance assessment: Needs assistance Sitting-balance support: Feet supported Sitting balance-Leahy Scale: Fair     Standing balance support: Bilateral upper extremity supported Standing balance-Leahy Scale: Poor Standing balance comment: Pt requries external support                            Communication Communication Communication: No apparent difficulties  Cognition Arousal: Alert Behavior During Therapy: WFL for tasks assessed/performed   PT - Cognitive impairments: No apparent impairments  Following commands: Impaired Following commands impaired: Follows multi-step commands inconsistently    Cueing Cueing Techniques: Verbal cues, Gestural cues  Exercises      General Comments General comments (skin integrity, edema, etc.): VSS      Pertinent Vitals/Pain Pain Assessment Pain  Assessment: Faces Faces Pain Scale: Hurts even more Pain Location: LLE with movement. Lt groin area Pain Descriptors / Indicators: Grimacing, Guarding, Aching Pain Intervention(s): Monitored during session, Repositioned    Home Living                          Prior Function            PT Goals (current goals can now be found in the care plan section) Acute Rehab PT Goals Patient Stated Goal: Get well PT Goal Formulation: With patient Time For Goal Achievement: 02/18/24 Potential to Achieve Goals: Fair Progress towards PT goals: Progressing toward goals    Frequency    Min 2X/week      PT Plan      Co-evaluation              AM-PAC PT "6 Clicks" Mobility   Outcome Measure  Help needed turning from your back to your side while in a flat bed without using bedrails?: A Little Help needed moving from lying on your back to sitting on the side of a flat bed without using bedrails?: A Little Help needed moving to and from a bed to a chair (including a wheelchair)?: A Little Help needed standing up from a chair using your arms (e.g., wheelchair or bedside chair)?: A Little Help needed to walk in hospital room?: A Little Help needed climbing 3-5 steps with a railing? : Total 6 Click Score: 16    End of Session Equipment Utilized During Treatment: Gait belt Activity Tolerance: Patient tolerated treatment well Patient left: in bed;with call bell/phone within reach;with bed alarm set;with nursing/sitter in room Nurse Communication: Mobility status PT Visit Diagnosis: Unsteadiness on feet (R26.81);Other abnormalities of gait and mobility (R26.89);Pain Pain - Right/Left: Left Pain - part of body: Hip     Time: 1610-9604 PT Time Calculation (min) (ACUTE ONLY): 13 min  Charges:    $Gait Training: 8-22 mins PT General Charges $$ ACUTE PT VISIT: 1 Visit                     Jory Ng, PT, DPT [facility]  Rehabilitation Services Physical  Therapist Office: [phone] Website: [email or web address]    Alinda Irani 02/09/2024, 4:35 PM

## 2024-02-09 NOTE — Progress Notes (Signed)
 ID Brief note -Remains afebrile(tmax 100.5 yesterday), suspect aspirated - transition to po cefadroxil 1gm bid on discharge to complete 6 weeks from aspiration eot 6/15. ID f/u on 5/21.  -Communicated to primary -ID will sign off

## 2024-02-09 NOTE — Plan of Care (Signed)
 Pt has rested quietly throughout the night with no distress noted. Alert and oriented. On room air. SR on the monitor. Voids per urinal. Medicated for pain once with relief noted. No other complaints voiced.     Problem: Education: Goal: Knowledge of General Education information will improve Description: Including pain rating scale, medication(s)/side effects and non-pharmacologic comfort measures Outcome: Progressing   Problem: Health Behavior/Discharge Planning: Goal: Ability to manage health-related needs will improve Outcome: Progressing   Problem: Clinical Measurements: Goal: Respiratory complications will improve Outcome: Progressing Goal: Cardiovascular complication will be avoided Outcome: Progressing   Problem: Coping: Goal: Level of anxiety will decrease Outcome: Progressing   Problem: Pain Managment: Goal: General experience of comfort will improve and/or be controlled Outcome: Progressing

## 2024-02-09 NOTE — Progress Notes (Signed)
 [facility]  Date of Admission:  01/31/2024   Total days of inpatient antibiotics 6  Principal Problem:   Cellulitis of left lower extremity Active Problems:   Polysubstance abuse (HCC)   Severe sepsis (HCC)   Hypokalemia   Bipolar 1 disorder (HCC)   Chronic alcohol abuse   Anemia of chronic disease   Transaminitis          Assessment: 33 year old male with bipolar disorder, polysubstance abuse, paresthesia between left thigh and knee since July 2025 presented due to worsening left foot pain.  Started on vancomycin  and ceftriaxone .  Found to have: #Left hip/femur cellulitis with osteomyelitis and septic arthritis #Active drug use->not a home iv abx candidate - MRI left femur showed septic arthritis/osteomyelitis of the left hip, proximal femur, abscesses throughout left anterior thigh - Orthopedics engaged recommended scanning pelvis.  Antibiotic management, consider IR engagement for aspiration of hip - CT pelvis showed proximal thigh cellulitis intramuscular abscesses. -Left foot with mild swelling, xray no acute abnormailities -On 5/25 patient underwent ultrasound-guided aspiration left anterior hip interventional radiology.  Noted the collection most consistent with abscess at 18 mL fluid was obtained.  Sent for cultures. Recommendations: -Start cefazolin . D/c vanc+ctx+ metro -Follow aspirate cultures(MSSA so far) - transition to po cefadroxil 1gm bid on discharge to complete 6 weeks from aspiration eot 6/15. ID f/u on 5/21.  -temp of 100.5 today, suspect aspiration.blood Cx drawn.    #Hepatitis C antibody positive -Hepatitis C viral 53mill->manage outpatient - Hep B immune - HIV negative -Hepatitis B nonimmune, okay to vaccinate   Evaluation of this patient requires complex antimicrobial therapy evaluation and counseling + isolation needs for disease transmission risk assessment and mitigation   Microbiology:    Antibiotics: Metronidazole  5/4-present Vancomycin  ceftriaxone  4/30-present Cultures: Blood 4/30 no growth SUBJECTIVE: Resting in bed. N0 new compalints Interval: Afebrile overnight.   Review of Systems: Review of Systems  All other systems reviewed and are negative.    Scheduled Meds:  docusate sodium   200 mg Oral BID   enoxaparin  (LOVENOX ) injection  40 mg Subcutaneous Daily   ferrous sulfate   325 mg Oral Q breakfast   folic acid   1 mg Oral Daily   midodrine   5 mg Oral TID WC   multivitamin with minerals  1 tablet Oral Daily   pantoprazole   40 mg Oral Daily   polyethylene glycol  17 g Oral BID   QUEtiapine   100 mg Oral QHS   thiamine   100 mg Oral Daily   Continuous Infusions:   ceFAZolin  (ANCEF ) IV 2 g (02/08/24 2132)   PRN Meds:.acetaminophen  **OR** acetaminophen , haloperidol  lactate, lip balm, LORazepam , melatonin, morphine  injection, naLOXone  (NARCAN )  injection, nicotine , ondansetron  (ZOFRAN ) IV, pneumococcal 20-valent conjugate vaccine Allergies  Allergen Reactions   Codeine Other (See Comments)    Patient states parents told him he was allergic at a young age.   Fish Allergy Other (See Comments)    Patient does not wish to eat ANY fish- reaction unclear   Shellfish Allergy Other (See Comments)    Patient does not wish to eat ANY shellfish- reaction unclear    OBJECTIVE: Vitals:   02/09/24 0001 02/09/24 0200 02/09/24 0300 02/09/24 0318  BP: 106/60   (!) 92/52  Pulse: 99     Resp: 20 18 18 18   Temp: 99.4 F (37.4 C)   98.3 F (36.8 C)  TempSrc: Oral   Oral  SpO2:    99%  Weight:       Body mass index is 22.06 kg/m.  Physical Exam Constitutional:      General: He is not in acute distress.    Appearance: He is normal weight. He is not toxic-appearing.  HENT:     Head: Normocephalic and atraumatic.     Right Ear: External ear normal.     Left Ear: External ear normal.     Nose: No congestion or rhinorrhea.     Mouth/Throat:     Mouth: Mucous  membranes are moist.     Pharynx: Oropharynx is clear.  Eyes:     Extraocular Movements: Extraocular movements intact.     Conjunctiva/sclera: Conjunctivae normal.     Pupils: Pupils are equal, round, and reactive to light.  Cardiovascular:     Rate and Rhythm: Normal rate and regular rhythm.     Heart sounds: No murmur heard.    No friction rub. No gallop.  Pulmonary:     Effort: Pulmonary effort is normal.     Breath sounds: Normal breath sounds.  Abdominal:     General: Abdomen is flat. Bowel sounds are normal.     Palpations: Abdomen is soft.  Musculoskeletal:        General: No swelling. Normal range of motion.     Cervical back: Normal range of motion and neck supple.  Skin:    General: Skin is warm and dry.  Neurological:     General: No focal deficit present.     Mental Status: He is oriented to person, place, and time.  Psychiatric:        Mood and Affect: Mood normal.       Lab Results Lab Results  Component Value Date   WBC 6.3 02/08/2024   HGB 10.3 (L) 02/08/2024   HCT 32.0 (L) 02/08/2024   MCV 89.9 02/08/2024   PLT 352 02/08/2024    Lab Results  Component Value Date   CREATININE 0.53 (L) 02/08/2024   BUN <5 (L) 02/08/2024   NA 135 02/08/2024   K 3.7 02/08/2024   CL 100 02/08/2024   CO2 23 02/08/2024    Lab Results  Component Value Date   ALT 56 (H) 02/08/2024   AST 91 (H) 02/08/2024   ALKPHOS 115 02/08/2024   BILITOT 0.5 02/08/2024        Orlie Bjornstad, MD [facility] 02/09/2024, 5:07 AM

## 2024-02-10 DIAGNOSIS — L03116 Cellulitis of left lower limb: Secondary | ICD-10-CM | POA: Diagnosis not present

## 2024-02-10 LAB — AEROBIC/ANAEROBIC CULTURE W GRAM STAIN (SURGICAL/DEEP WOUND)

## 2024-02-10 MED ORDER — QUETIAPINE FUMARATE 50 MG PO TABS
50.0000 mg | ORAL_TABLET | Freq: Every day | ORAL | Status: DC
  Administered 2024-02-10 – 2024-02-15 (×6): 50 mg via ORAL
  Filled 2024-02-10 (×6): qty 1

## 2024-02-10 MED ORDER — HYDROCODONE-ACETAMINOPHEN 5-325 MG PO TABS
1.0000 | ORAL_TABLET | Freq: Four times a day (QID) | ORAL | Status: DC | PRN
  Administered 2024-02-10 – 2024-02-16 (×22): 1 via ORAL
  Filled 2024-02-10 (×22): qty 1

## 2024-02-10 NOTE — Plan of Care (Signed)
 Pt has rested quietly throughout the night with no distress noted. Alert and oriented. On room air. Pt is not on tele monitor. Voids per urinal. Medicated twice for pain with relief noted. Given ativan  for anxiety with relief noted. No other complaints voiced.     Problem: Education: Goal: Knowledge of General Education information will improve Description: Including pain rating scale, medication(s)/side effects and non-pharmacologic comfort measures Outcome: Progressing   Problem: Health Behavior/Discharge Planning: Goal: Ability to manage health-related needs will improve Outcome: Progressing   Problem: Clinical Measurements: Goal: Respiratory complications will improve Outcome: Progressing Goal: Cardiovascular complication will be avoided Outcome: Progressing   Problem: Pain Managment: Goal: General experience of comfort will improve and/or be controlled Outcome: Progressing

## 2024-02-10 NOTE — Plan of Care (Signed)
  Problem: Education: Goal: Knowledge of General Education information will improve Description: Including pain rating scale, medication(s)/side effects and non-pharmacologic comfort measures Outcome: Progressing   Problem: Health Behavior/Discharge Planning: Goal: Ability to manage health-related needs will improve Outcome: Progressing   Problem: Clinical Measurements: Goal: Will remain free from infection Outcome: Progressing Goal: Diagnostic test results will improve Outcome: Progressing   Problem: Activity: Goal: Risk for activity intolerance will decrease Outcome: Progressing   Problem: Coping: Goal: Level of anxiety will decrease Outcome: Progressing   

## 2024-02-10 NOTE — Progress Notes (Signed)
 PROGRESS NOTE                                                                                                                                                                                                             Patient Demographics:    Mark Hammond, is a 33 y.o. male, DOB - Aug 15, 1991, XLK:[id number]  Outpatient Primary MD for the patient is Patient, No Pcp Per    LOS - 10  Admit date - 01/31/2024    Chief Complaint  Patient presents with   Leg Swelling   Generalized Pain       Brief Narrative (HPI from H&P)   33 y.o. male with PMH significant for polysubstance abuse including alcohol, tobacco, cocaine , heroin, MDMA, marijuana, bipolar disorder, schizoaffective schizophrenia, 4/30 patient presented to ED with complaint of left foot pain, swelling, warmth.  Further workup suggestive of diffuse septic arthritis of the left hip, proximal femur along with left hip and possible pelvic abscess.  He is currently septic.  Transferred to my care on 02/04/2024.    Subjective:   Patient in bed, appears comfortable, denies any headache, no fever, no chest pain or pressure, no shortness of breath , no abdominal pain. No new focal weakness.    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  Currently on Vancomycin  and Rocephin , orthopedics, PCCM and ID following, sepsis pathophysiology considerably improved, CT pelvis done Case discussed with Dr. Julio Ohm, patient has a left thigh soft tissue infection, knee per Dr. Julio Ohm looks stable, no event drainage of his left thigh abscess by IR on 02/05/2024 wound abscess culture growing MSSA, echo nonacute, continues to have left leg weakness despite pain improving will request neurology to evaluate as well.    ID on board, due to his underlying osteomyelitis which is chronic patient has been placed on 6 weeks of IV cefazolin  with stop date on 03/17/2024, ID  appointment on 02/01/2024, poor candidate for PICC line as he is homeless and history of drug abuse.  Upon DC PO cefadroxil 1gm bid on discharge to complete 6 weeks from aspiration eot 6/15. ID f/u on 5/21.  HX of polysubstance abuse including ongoing alcohol abuse, ongoing cocaine , amphetamine, cannabis abuse.  Counseled to abstain from all.  On CIWA protocol.   Hypomagnesemia and hypokalemia.  Replaced.    Anemia of chronic disease.  Monitor.  Type screen and check INR.  Left lower extremity weakness.  Mostly guarding from pain, seen by neurology, recent L-spine imaging outpatient stable.  Continue supportive care.  PT OT.  Outpatient neuro follow-up.       Condition - Extremely Guarded  Family Communication  :  None present  Code Status :  Full  Consults  :  Ortho, ID, PCCM, IR, neurology  PUD Prophylaxis :  PPI   Procedures  :     Left anterior thigh abscess aspiration by IR on 02/05/2024.    Echo -  1. Left ventricular ejection fraction, by estimation, is 60 to 65%. The left ventricle has normal function. The left ventricle has no regional wall motion abnormalities. Left ventricular diastolic parameters were normal.   2. Right ventricular systolic function is normal. The right ventricular size is normal. Tricuspid regurgitation signal is inadequate for assessing PA pressure.   3. The mitral valve is grossly normal. No evidence of mitral valve regurgitation. No evidence of mitral stenosis.   4. The aortic valve was not well visualized. There is mild calcification of the aortic valve. There is mild thickening of the aortic valve. Aortic valve regurgitation is not visualized. No aortic stenosis is present.   5. The inferior vena cava is normal in size with greater than 50% respiratory variability, suggesting right atrial pressure of 3 mmHg.   CT pelvis -  Left proximal thigh cellulitis with an intramuscular abscess  MRI - 1. Findings are highly suspicious for septic arthritis and  osteomyelitis of the left hip and proximal left femur. The proximal extent of the pelvic involvement is not imaged. The sacroiliac joints are not included. 2. Diffuse edema and enhancement throughout the proximal left thigh musculature with multiple irregular peripherally enhancing fluid collections anteriorly in the proximal left thigh, largest superficial and lateral to the proximal rectus femoris muscle, suspicious for abscesses. 3. Incomplete visualization of the proximal extension of inflammation into the pelvis along the iliopsoas and obturator internus muscles. 4. Asymmetric subcutaneous edema within the visualized left pelvis and left thigh. 5. Recommend further evaluation of the pelvis (with CT or MRI) to better evaluate the proximal extent of the infection.      Disposition Plan  :    Status is: Inpatient   DVT Prophylaxis  :    enoxaparin  (LOVENOX ) injection 40 mg Start: 02/07/24 1115 SCDs Start: 01/31/24 2147  Lab Results  Component Value Date   PLT 332 02/09/2024    Diet :  Diet Order             Diet regular Room service appropriate? Yes; Fluid consistency: Thin  Diet effective now                    Inpatient Medications  Scheduled Meds:  docusate sodium   200 mg Oral BID   enoxaparin  (LOVENOX ) injection  40 mg Subcutaneous Daily   ferrous sulfate   325 mg Oral Q breakfast   folic acid   1 mg Oral Daily   midodrine   5 mg Oral TID WC   multivitamin with minerals  1 tablet Oral Daily   pantoprazole   40 mg Oral Daily   polyethylene glycol  17 g Oral BID   QUEtiapine   50 mg Oral QHS   thiamine   100 mg Oral Daily   Continuous Infusions:   ceFAZolin  (ANCEF ) IV 2 g (02/10/24 0546)   lactated ringers  75 mL/hr at  02/09/24 1657   PRN Meds:.acetaminophen  **OR** acetaminophen , haloperidol  lactate, HYDROcodone -acetaminophen , lip balm, LORazepam , melatonin, naLOXone  (NARCAN )  injection, nicotine , ondansetron  (ZOFRAN ) IV, pneumococcal 20-valent conjugate vaccine     Objective:   Vitals:   02/09/24 0558 02/09/24 1036 02/09/24 2003 02/09/24 2329  BP:  (!) 102/56 (!) 103/50 (!) 97/56  Pulse:   70 75  Resp: 16  18 18   Temp:   99.4 F (37.4 C) 98 F (36.7 C)  TempSrc:   Oral Oral  SpO2:   100% 100%  Weight: 48.2 kg       Wt Readings from Last 3 Encounters:  02/09/24 48.2 kg  01/26/24 56.7 kg  01/26/24 56.7 kg     Intake/Output Summary (Last 24 hours) at 02/10/2024 0922 Last data filed at 02/10/2024 0549 Gross per 24 hour  Intake 960 ml  Output 1300 ml  Net -340 ml     Physical Exam  Awake- alert, left knee and thigh laying and redness has improved, left hip and leg weakness as compared to the right [place].AT,PERRAL Supple Neck, No JVD,   Symmetrical Chest wall movement, Good air movement bilaterally, CTAB RRR,No Gallops,Rubs or new Murmurs,  +ve B.Sounds, Abd Soft, No tenderness,        Data Review:    Recent Labs  Lab 02/05/24 0019 02/06/24 0749 02/07/24 0445 02/08/24 0506 02/09/24 0634  WBC 4.5 3.5* 4.2 6.3 7.1  HGB 9.3* 9.6* 9.5* 10.3* 9.8*  HCT 28.2* 29.3* 30.0* 32.0* 30.2*  PLT 241 259 316 352 332  MCV 89.0 88.3 89.0 89.9 89.9  MCH 29.3 28.9 28.2 28.9 29.2  MCHC 33.0 32.8 31.7 32.2 32.5  RDW 14.2 14.4 14.6 14.7 15.2  LYMPHSABS 1.2 1.1 1.4 1.6 2.1  MONOABS 0.6 0.4 0.4 0.6 0.7  EOSABS 0.2 0.3 0.3 0.2 0.2  BASOSABS 0.0 0.0 0.0 0.1 0.1    Recent Labs  Lab 02/04/24 0632 02/04/24 0916 02/04/24 1147 02/05/24 0019 02/06/24 0418 02/06/24 0749 02/07/24 0445 02/08/24 0506 02/08/24 0822 02/09/24 0634  NA 133*  --   --  135  --  136 136 135  --  135  K 3.2*  --   --  3.2*  --  3.3* 3.8 3.7  --  3.4*  CL 97*  --   --  101  --  104 103 100  --  100  CO2 26  --   --  26  --  25 26 23   --  24  ANIONGAP 10  --   --  8  --  7 7 12   --  11  GLUCOSE 109*  --   --  103*  --  101* 104* 101*  --  100*  BUN <5*  --   --  <5*  --  <5* <5* <5*  --  <5*  CREATININE 0.55*  --   --  0.51*  --  0.52* 0.61 0.53*  --  0.61  AST 143*   --   --  119*  --  108* 115* 91*  --  80*  ALT 61*  --   --  60*  --  58* 63* 56*  --  49*  ALKPHOS 99  --   --  92  --  110 132* 115  --  117  BILITOT 0.8  --   --  0.7  --  0.6 0.4 0.5  --  0.4  ALBUMIN 1.9*  --   --  1.8*  --  2.0*  2.1* 2.3*  --  2.2*  CRP 10.6*  --   --  8.5* 5.0*  --  3.0* 1.5*  --   --   PROCALCITON 0.27  --   --  0.27  --  0.32 0.28 0.12  --  <0.10  LATICACIDVEN  --  1.1 1.2  --   --   --   --   --   --   --   INR 1.2  --   --   --   --   --   --   --   --   --   TSH  --   --   --   --   --   --  2.633  --  3.042  --   MG 1.6*  --   --  1.6*  --  1.7 1.6* 1.7  --   --   PHOS 3.4  --   --   --   --   --   --   --   --   --   CALCIUM 7.7*  --   --  7.7*  --  7.8* 8.4* 8.5*  --  8.5*      Recent Labs  Lab 02/04/24 0632 02/04/24 0916 02/04/24 1147 02/05/24 0019 02/06/24 0418 02/06/24 0749 02/07/24 0445 02/08/24 0506 02/08/24 0822 02/09/24 0634  CRP 10.6*  --   --  8.5* 5.0*  --  3.0* 1.5*  --   --   PROCALCITON 0.27  --   --  0.27  --  0.32 0.28 0.12  --  <0.10  LATICACIDVEN  --  1.1 1.2  --   --   --   --   --   --   --   INR 1.2  --   --   --   --   --   --   --   --   --   TSH  --   --   --   --   --   --  2.633  --  3.042  --   MG 1.6*  --   --  1.6*  --  1.7 1.6* 1.7  --   --   CALCIUM 7.7*  --   --  7.7*  --  7.8* 8.4* 8.5*  --  8.5*    --------------------------------------------------------------------------------------------------------------- Lab Results  Component Value Date   CHOL 96 12/08/2023   HDL 34 (L) 12/08/2023   LDLCALC 51 12/08/2023   TRIG 55 12/08/2023   CHOLHDL 2.8 12/08/2023    Lab Results  Component Value Date   HGBA1C 6.0 (H) 12/08/2023   Recent Labs    02/08/24 0822 02/08/24 1056  TSH 3.042  --   FREET4  --  1.03   No results for input(s): "VITAMINB12", "FOLATE", "FERRITIN", "TIBC", "IRON", "RETICCTPCT" in the last 72  hours. ------------------------------------------------------------------------------------------------------------------ Cardiac Enzymes No results for input(s): "CKMB", "TROPONINI", "MYOGLOBIN" in the last 168 hours.  Invalid input(s): "CK"    Radiology Report CT FEMUR LEFT W CONTRAST Result Date: 02/08/2024 CLINICAL DATA:  Soft tissue infection suspected, thigh, no prior imaging EXAM: CT OF THE LOWER LEFT EXTREMITY WITH CONTRAST TECHNIQUE: Multidetector CT imaging of the lower left extremity was performed according to the standard protocol following intravenous contrast administration. RADIATION DOSE REDUCTION: This exam was performed according to the departmental dose-optimization program which includes automated exposure control, adjustment of the mA and/or kV according to patient size and/or use of  iterative reconstruction technique. CONTRAST:  75mL OMNIPAQUE  IOHEXOL  350 MG/ML SOLN COMPARISON:  Prior MRI 02/03/2024 and CT 02/04/2024. FINDINGS: Bones/Joint/Cartilage Again noted are changes of septic arthritis and osteomyelitis within the left hip, acetabulum, and proximal left femur, unchanged since prior MRI and CT. Ligaments Suboptimally assessed by CT. Muscles and Tendons Decreasing size of the intramuscular fluid collection in the upper left rectus femoris muscle now measuring 2.1 x 1.0 cm compared to 4.1 x 2.7 cm on prior CT. No visible extension into the pelvis. Soft tissues As above IMPRESSION: Continued changes of osteomyelitis and septic arthritis surrounded the left hip and involving the acetabula and proximal left femur. Intramuscular abscess in the upper rectus femorals muscle has decreased in size, now 2.1 x 1.0 cm compared to 4.1 x 2.7 cm previously. Electronically Signed   By: Janeece Mechanic M.D.   On: 02/08/2024 18:42   DG Chest Port 1 View Result Date: 02/08/2024 CLINICAL DATA:  Shortness of breath. EXAM: PORTABLE CHEST 1 VIEW COMPARISON:  AP chest 02/04/2024, CT chest, abdomen, and  pelvis 05/28/2023 FINDINGS: Cardiac silhouette and mediastinal contours are within limits. The lungs are clear. No pleural effusion pneumothorax. Minimal dextrocurvature of the upper thoracic spine, unchanged from prior. IMPRESSION: No acute cardiopulmonary disease process. Electronically Signed   By: Bertina Broccoli M.D.   On: 02/08/2024 14:25     Signature  -   Lynnwood Sauer M.D on 02/10/2024 at 9:22 AM   -  To page go to [email or web address]

## 2024-02-11 DIAGNOSIS — L03116 Cellulitis of left lower limb: Secondary | ICD-10-CM | POA: Diagnosis not present

## 2024-02-11 MED ORDER — MIDODRINE HCL 5 MG PO TABS
10.0000 mg | ORAL_TABLET | Freq: Three times a day (TID) | ORAL | Status: DC
  Administered 2024-02-11 – 2024-02-16 (×16): 10 mg via ORAL
  Filled 2024-02-11 (×16): qty 2

## 2024-02-11 MED ORDER — SODIUM CHLORIDE 0.9 % IV BOLUS
500.0000 mL | Freq: Once | INTRAVENOUS | Status: AC
  Administered 2024-02-11: 500 mL via INTRAVENOUS

## 2024-02-11 MED ORDER — ALBUMIN HUMAN 25 % IV SOLN
50.0000 g | Freq: Two times a day (BID) | INTRAVENOUS | Status: AC
  Administered 2024-02-11: 50 g via INTRAVENOUS
  Administered 2024-02-11: 12.5 g via INTRAVENOUS
  Administered 2024-02-12 – 2024-02-13 (×2): 50 g via INTRAVENOUS
  Filled 2024-02-11 (×4): qty 200

## 2024-02-11 MED ORDER — PROSOURCE PLUS PO LIQD
30.0000 mL | Freq: Two times a day (BID) | ORAL | Status: DC
  Administered 2024-02-11 – 2024-02-16 (×8): 30 mL via ORAL
  Filled 2024-02-11 (×9): qty 30

## 2024-02-11 MED ORDER — LORAZEPAM 2 MG/ML IJ SOLN: 0.5000 mg | Freq: Two times a day (BID) | INTRAMUSCULAR | Status: DC | PRN

## 2024-02-11 NOTE — Progress Notes (Signed)
   02/11/24 0227  Vitals  BP (!) 84/45  MAP (mmHg) (!) 57  BP Location Left Arm  BP Method Automatic  Patient Position (if appropriate) Lying  MEWS COLOR  MEWS Score Color Green  MEWS Score  MEWS Temp 0  MEWS Systolic 1  MEWS Pulse 0  MEWS RR 0  MEWS LOC 0  MEWS Score 1  Provider Notification  Provider Name/Title V.Rathore,MD  Date Provider Notified 02/11/24  Time Provider Notified 0227  Method of Notification Page  Notification Reason Other (Comment) (BP 84/45)  Provider response See new orders (NS Bolus)  Date of Provider Response 02/11/24  Time of Provider Response [phone]

## 2024-02-11 NOTE — Progress Notes (Signed)
 PROGRESS NOTE                                                                                                                                                                                                             Patient Demographics:    Mark Hammond, is a 33 y.o. male, DOB - Jan 17, 1991, ZOX:[id number]  Outpatient Primary MD for the patient is Patient, No Pcp Per    LOS - 11  Admit date - 01/31/2024    Chief Complaint  Patient presents with   Leg Swelling   Generalized Pain       Brief Narrative (HPI from H&P)   33 y.o. male with PMH significant for polysubstance abuse including alcohol, tobacco, cocaine , heroin, MDMA, marijuana, bipolar disorder, schizoaffective schizophrenia, 4/30 patient presented to ED with complaint of left foot pain, swelling, warmth.  Further workup suggestive of diffuse septic arthritis of the left hip, proximal femur along with left hip and possible pelvic abscess.  He is currently septic.  Transferred to my care on 02/04/2024.    Subjective:   Patient in bed, appears comfortable, denies any headache, no fever, no chest pain or pressure, no shortness of breath , no abdominal pain. No new focal weakness.   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  Currently on Vancomycin  and Rocephin , orthopedics, PCCM and ID following, sepsis pathophysiology considerably improved, CT pelvis done Case discussed with Dr. Julio Ohm, patient has a left thigh soft tissue infection, knee per Dr. Julio Ohm looks stable, no event drainage of his left thigh abscess by IR on 02/05/2024 wound abscess culture growing MSSA, echo nonacute, continues to have left leg weakness despite pain improving will request neurology to evaluate as well.    ID on board, due to his underlying osteomyelitis which is chronic patient has been placed on 6 weeks of IV cefazolin  with stop date on 03/17/2024, ID  appointment on 02/01/2024, poor candidate for PICC line as he is homeless and history of drug abuse.  Upon DC PO cefadroxil 1gm bid on discharge to complete 6 weeks from aspiration eot 6/15. ID f/u on 5/21.  HX of polysubstance abuse including ongoing alcohol abuse, ongoing cocaine , amphetamine, cannabis abuse.  Counseled to abstain from all.  On CIWA protocol.   Hypomagnesemia and hypokalemia.  Replaced.  Anemia of chronic disease.  Monitor.  Type screen and check INR.  Mild asymptomatic hypotension, moderate PCM.  Stable TSH and random cortisol, reduce narcotics, has been hydrated, placed on protein supplementation and monitor.  On midodrine .  Asymptomatic.    Left lower extremity weakness.  Mostly guarding from pain, seen by neurology, recent L-spine imaging outpatient stable.  Continue supportive care.  PT OT.  Outpatient neuro follow-up.       Condition - Extremely Guarded  Family Communication  :  None present  Code Status :  Full  Consults  :  Ortho, ID, PCCM, IR, neurology  PUD Prophylaxis :  PPI   Procedures  :     Left anterior thigh abscess aspiration by IR on 02/05/2024.    Echo -  1. Left ventricular ejection fraction, by estimation, is 60 to 65%. The left ventricle has normal function. The left ventricle has no regional wall motion abnormalities. Left ventricular diastolic parameters were normal.   2. Right ventricular systolic function is normal. The right ventricular size is normal. Tricuspid regurgitation signal is inadequate for assessing PA pressure.   3. The mitral valve is grossly normal. No evidence of mitral valve regurgitation. No evidence of mitral stenosis.   4. The aortic valve was not well visualized. There is mild calcification of the aortic valve. There is mild thickening of the aortic valve. Aortic valve regurgitation is not visualized. No aortic stenosis is present.   5. The inferior vena cava is normal in size with greater than 50% respiratory  variability, suggesting right atrial pressure of 3 mmHg.   CT pelvis -  Left proximal thigh cellulitis with an intramuscular abscess  MRI - 1. Findings are highly suspicious for septic arthritis and osteomyelitis of the left hip and proximal left femur. The proximal extent of the pelvic involvement is not imaged. The sacroiliac joints are not included. 2. Diffuse edema and enhancement throughout the proximal left thigh musculature with multiple irregular peripherally enhancing fluid collections anteriorly in the proximal left thigh, largest superficial and lateral to the proximal rectus femoris muscle, suspicious for abscesses. 3. Incomplete visualization of the proximal extension of inflammation into the pelvis along the iliopsoas and obturator internus muscles. 4. Asymmetric subcutaneous edema within the visualized left pelvis and left thigh. 5. Recommend further evaluation of the pelvis (with CT or MRI) to better evaluate the proximal extent of the infection.      Disposition Plan  :    Status is: Inpatient   DVT Prophylaxis  :    enoxaparin  (LOVENOX ) injection 40 mg Start: 02/07/24 1115 SCDs Start: 01/31/24 2147  Lab Results  Component Value Date   PLT 332 02/09/2024    Diet :  Diet Order             Diet regular Room service appropriate? Yes; Fluid consistency: Thin  Diet effective now                    Inpatient Medications  Scheduled Meds:  (feeding supplement) PROSource Plus  30 mL Oral BID BM   docusate sodium   200 mg Oral BID   enoxaparin  (LOVENOX ) injection  40 mg Subcutaneous Daily   ferrous sulfate   325 mg Oral Q breakfast   folic acid   1 mg Oral Daily   midodrine   10 mg Oral TID WC   multivitamin with minerals  1 tablet Oral Daily   pantoprazole   40 mg Oral Daily   polyethylene glycol  17 g  Oral BID   QUEtiapine   50 mg Oral QHS   thiamine   100 mg Oral Daily   Continuous Infusions:  albumin human      ceFAZolin  (ANCEF ) IV 2 g (02/11/24 0526)   PRN  Meds:.acetaminophen  **OR** acetaminophen , haloperidol  lactate, HYDROcodone -acetaminophen , lip balm, LORazepam , melatonin, naLOXone  (NARCAN )  injection, nicotine , ondansetron  (ZOFRAN ) IV, pneumococcal 20-valent conjugate vaccine    Objective:   Vitals:   02/11/24 0349 02/11/24 0400 02/11/24 0500 02/11/24 0524  BP: (!) 88/48 (!) 91/56  (!) 93/56  Pulse:      Resp:      Temp:      TempSrc:      SpO2:      Weight:   51.4 kg     Wt Readings from Last 3 Encounters:  02/11/24 51.4 kg  01/26/24 56.7 kg  01/26/24 56.7 kg     Intake/Output Summary (Last 24 hours) at 02/11/2024 0831 Last data filed at 02/11/2024 0600 Gross per 24 hour  Intake --  Output 1450 ml  Net -1450 ml     Physical Exam  Awake- alert, left knee and thigh laying and redness has improved, left hip and leg weakness as compared to the right [place].AT,PERRAL Supple Neck, No JVD,   Symmetrical Chest wall movement, Good air movement bilaterally, CTAB RRR,No Gallops,Rubs or new Murmurs,  +ve B.Sounds, Abd Soft, No tenderness,        Data Review:    Recent Labs  Lab 02/05/24 0019 02/06/24 0749 02/07/24 0445 02/08/24 0506 02/09/24 0634  WBC 4.5 3.5* 4.2 6.3 7.1  HGB 9.3* 9.6* 9.5* 10.3* 9.8*  HCT 28.2* 29.3* 30.0* 32.0* 30.2*  PLT 241 259 316 352 332  MCV 89.0 88.3 89.0 89.9 89.9  MCH 29.3 28.9 28.2 28.9 29.2  MCHC 33.0 32.8 31.7 32.2 32.5  RDW 14.2 14.4 14.6 14.7 15.2  LYMPHSABS 1.2 1.1 1.4 1.6 2.1  MONOABS 0.6 0.4 0.4 0.6 0.7  EOSABS 0.2 0.3 0.3 0.2 0.2  BASOSABS 0.0 0.0 0.0 0.1 0.1    Recent Labs  Lab 02/04/24 0916 02/04/24 1147 02/05/24 0019 02/06/24 0418 02/06/24 0749 02/07/24 0445 02/08/24 0506 02/08/24 0822 02/09/24 0634  NA  --   --  135  --  136 136 135  --  135  K  --   --  3.2*  --  3.3* 3.8 3.7  --  3.4*  CL  --   --  101  --  104 103 100  --  100  CO2  --   --  26  --  25 26 23   --  24  ANIONGAP  --   --  8  --  7 7 12   --  11  GLUCOSE  --   --  103*  --  101* 104* 101*  --   100*  BUN  --   --  <5*  --  <5* <5* <5*  --  <5*  CREATININE  --   --  0.51*  --  0.52* 0.61 0.53*  --  0.61  AST  --   --  119*  --  108* 115* 91*  --  80*  ALT  --   --  60*  --  58* 63* 56*  --  49*  ALKPHOS  --   --  92  --  110 132* 115  --  117  BILITOT  --   --  0.7  --  0.6 0.4 0.5  --  0.4  ALBUMIN  --   --  1.8*  --  2.0* 2.1* 2.3*  --  2.2*  CRP  --   --  8.5* 5.0*  --  3.0* 1.5*  --   --   PROCALCITON  --   --  0.27  --  0.32 0.28 0.12  --  <0.10  LATICACIDVEN 1.1 1.2  --   --   --   --   --   --   --   TSH  --   --   --   --   --  2.633  --  3.042  --   MG  --   --  1.6*  --  1.7 1.6* 1.7  --   --   CALCIUM  --   --  7.7*  --  7.8* 8.4* 8.5*  --  8.5*      Recent Labs  Lab 02/04/24 0916 02/04/24 1147 02/05/24 0019 02/06/24 0418 02/06/24 0749 02/07/24 0445 02/08/24 0506 02/08/24 0822 02/09/24 0634  CRP  --   --  8.5* 5.0*  --  3.0* 1.5*  --   --   PROCALCITON  --   --  0.27  --  0.32 0.28 0.12  --  <0.10  LATICACIDVEN 1.1 1.2  --   --   --   --   --   --   --   TSH  --   --   --   --   --  2.633  --  3.042  --   MG  --   --  1.6*  --  1.7 1.6* 1.7  --   --   CALCIUM  --   --  7.7*  --  7.8* 8.4* 8.5*  --  8.5*    --------------------------------------------------------------------------------------------------------------- Lab Results  Component Value Date   CHOL 96 12/08/2023   HDL 34 (L) 12/08/2023   LDLCALC 51 12/08/2023   TRIG 55 12/08/2023   CHOLHDL 2.8 12/08/2023    Lab Results  Component Value Date   HGBA1C 6.0 (H) 12/08/2023   Recent Labs    02/08/24 1056  FREET4 1.03   No results for input(s): "VITAMINB12", "FOLATE", "FERRITIN", "TIBC", "IRON", "RETICCTPCT" in the last 72 hours. ------------------------------------------------------------------------------------------------------------------ Cardiac Enzymes No results for input(s): "CKMB", "TROPONINI", "MYOGLOBIN" in the last 168 hours.  Invalid input(s): "CK"    Radiology  Report No results found.    Signature  -   Lynnwood Sauer M.D on 02/11/2024 at 8:31 AM   -  To page go to [email or web address]

## 2024-02-12 DIAGNOSIS — L03116 Cellulitis of left lower limb: Secondary | ICD-10-CM | POA: Diagnosis not present

## 2024-02-12 LAB — PHOSPHORUS: Phosphorus: 3.8 mg/dL (ref 2.5–4.6)

## 2024-02-12 LAB — CBC WITH DIFFERENTIAL/PLATELET
Abs Immature Granulocytes: 0.02 10*3/uL (ref 0.00–0.07)
Basophils Absolute: 0.1 10*3/uL (ref 0.0–0.1)
Basophils Relative: 3 %
Eosinophils Absolute: 0.2 10*3/uL (ref 0.0–0.5)
Eosinophils Relative: 3 %
HCT: 32.1 % — ABNORMAL LOW (ref 39.0–52.0)
Hemoglobin: 10 g/dL — ABNORMAL LOW (ref 13.0–17.0)
Immature Granulocytes: 0 %
Lymphocytes Relative: 37 %
Lymphs Abs: 1.9 10*3/uL (ref 0.7–4.0)
MCH: 28.7 pg (ref 26.0–34.0)
MCHC: 31.2 g/dL (ref 30.0–36.0)
MCV: 92.2 fL (ref 80.0–100.0)
Monocytes Absolute: 0.4 10*3/uL (ref 0.1–1.0)
Monocytes Relative: 8 %
Neutro Abs: 2.5 10*3/uL (ref 1.7–7.7)
Neutrophils Relative %: 49 %
Platelets: 375 10*3/uL (ref 150–400)
RBC: 3.48 MIL/uL — ABNORMAL LOW (ref 4.22–5.81)
RDW: 15.4 % (ref 11.5–15.5)
WBC: 5.2 10*3/uL (ref 4.0–10.5)
nRBC: 0 % (ref 0.0–0.2)

## 2024-02-12 LAB — COMPREHENSIVE METABOLIC PANEL WITH GFR
ALT: 40 U/L (ref 0–44)
AST: 84 U/L — ABNORMAL HIGH (ref 15–41)
Albumin: 3.7 g/dL (ref 3.5–5.0)
Alkaline Phosphatase: 111 U/L (ref 38–126)
Anion gap: 14 (ref 5–15)
BUN: 8 mg/dL (ref 6–20)
CO2: 25 mmol/L (ref 22–32)
Calcium: 9.1 mg/dL (ref 8.9–10.3)
Chloride: 100 mmol/L (ref 98–111)
Creatinine, Ser: 0.64 mg/dL (ref 0.61–1.24)
GFR, Estimated: >60 mL/min (ref >60)
Glucose, Bld: 115 mg/dL — ABNORMAL HIGH (ref 70–99)
Potassium: 3.3 mmol/L — ABNORMAL LOW (ref 3.5–5.1)
Sodium: 139 mmol/L (ref 135–145)
Total Bilirubin: 0.4 mg/dL (ref 0.0–1.2)
Total Protein: 7.5 g/dL (ref 6.5–8.1)

## 2024-02-12 LAB — C-REACTIVE PROTEIN: CRP: 0.8 mg/dL (ref <1.0)

## 2024-02-12 LAB — MAGNESIUM: Magnesium: 1.5 mg/dL — ABNORMAL LOW (ref 1.7–2.4)

## 2024-02-12 LAB — PROCALCITONIN: Procalcitonin: <0.1 ng/mL

## 2024-02-12 MED ORDER — MAGNESIUM SULFATE 4 GM/100ML IV SOLN
4.0000 g | Freq: Once | INTRAVENOUS | Status: AC
  Administered 2024-02-12: 4 g via INTRAVENOUS
  Filled 2024-02-12: qty 100

## 2024-02-12 MED ORDER — RIVAROXABAN 10 MG PO TABS
10.0000 mg | ORAL_TABLET | Freq: Every day | ORAL | Status: DC
  Administered 2024-02-12 – 2024-02-16 (×5): 10 mg via ORAL
  Filled 2024-02-12 (×5): qty 1

## 2024-02-12 MED ORDER — POTASSIUM CHLORIDE CRYS ER 20 MEQ PO TBCR
40.0000 meq | EXTENDED_RELEASE_TABLET | Freq: Two times a day (BID) | ORAL | Status: AC
  Administered 2024-02-12 (×2): 40 meq via ORAL
  Filled 2024-02-12 (×2): qty 2

## 2024-02-12 MED ORDER — KETOCONAZOLE 2 % EX CREA
TOPICAL_CREAM | Freq: Every day | CUTANEOUS | Status: DC
  Filled 2024-02-12: qty 15

## 2024-02-12 MED ORDER — NYSTATIN 100000 UNIT/GM EX POWD
Freq: Three times a day (TID) | CUTANEOUS | Status: DC
  Filled 2024-02-12: qty 15

## 2024-02-12 NOTE — Progress Notes (Signed)
 PROGRESS NOTE                                                                                                                                                                                                             Patient Demographics:    Mark Hammond, is a 33 y.o. male, DOB - 1991/07/08, ZOX:[id number]  Outpatient Primary MD for the patient is Patient, No Pcp Per    LOS - 12  Admit date - 01/31/2024    Chief Complaint  Patient presents with   Leg Swelling   Generalized Pain       Brief Narrative (HPI from H&P)   33 y.o. male with PMH significant for polysubstance abuse including alcohol, tobacco, cocaine , heroin, MDMA, marijuana, bipolar disorder, schizoaffective schizophrenia, 4/30 patient presented to ED with complaint of left foot pain, swelling, warmth.  Further workup suggestive of diffuse septic arthritis of the left hip, proximal femur along with left hip and possible pelvic abscess.  He is currently septic.  Transferred to my care on 02/04/2024.    Subjective:   Patient in bed, appears comfortable, denies any headache, no fever, no chest pain or pressure, no shortness of breath , no abdominal pain. No focal weakness.   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  Currently on Vancomycin  and Rocephin , orthopedics, PCCM and ID following, sepsis pathophysiology considerably improved, CT pelvis done Case discussed with Dr. Julio Ohm, patient has a left thigh soft tissue infection, knee per Dr. Julio Ohm looks stable, no event drainage of his left thigh abscess by IR on 02/05/2024 wound abscess culture growing MSSA, echo nonacute, continues to have left leg weakness despite pain improving will request neurology to evaluate as well.    ID on board, due to his underlying osteomyelitis which is chronic patient has been placed on 6 weeks of IV cefazolin  with stop date on 03/17/2024, ID appointment on  02/01/2024, poor candidate for PICC line as he is homeless and history of drug abuse.  Upon DC PO cefadroxil 1gm bid on discharge to complete 6 weeks from aspiration eot 6/15. ID f/u on 5/21.  HX of polysubstance abuse including ongoing alcohol abuse, ongoing cocaine , amphetamine, cannabis abuse.  Counseled to abstain from all.  On CIWA protocol.   Hypomagnesemia and hypokalemia.  Replaced.  Anemia of chronic disease.  Monitor.  Type screen and check INR.  Mild asymptomatic hypotension, moderate PCM.  Stable TSH and random cortisol, reduce narcotics, has been hydrated, placed on protein supplementation and monitor.  On midodrine .  Asymptomatic.    Left lower extremity weakness.  Mostly guarding from pain, seen by neurology, recent L-spine imaging outpatient stable.  Continue supportive care.  PT OT.  Outpatient neuro follow-up.  Weakness and deconditioning.  Does not want to sit in the chair, extremely reluctant to move or do any activity, soils on himself, now developing groin fungal infection.  Requested to stay clean, sit up in the chair, apply nystatin powder 3 times a day.       Condition - Extremely Guarded  Family Communication  :  None present  Code Status :  Full  Consults  :  Ortho, ID, PCCM, IR, neurology  PUD Prophylaxis :  PPI   Procedures  :     Left anterior thigh abscess aspiration by IR on 02/05/2024.    Echo -  1. Left ventricular ejection fraction, by estimation, is 60 to 65%. The left ventricle has normal function. The left ventricle has no regional wall motion abnormalities. Left ventricular diastolic parameters were normal.   2. Right ventricular systolic function is normal. The right ventricular size is normal. Tricuspid regurgitation signal is inadequate for assessing PA pressure.   3. The mitral valve is grossly normal. No evidence of mitral valve regurgitation. No evidence of mitral stenosis.   4. The aortic valve was not well visualized. There is mild  calcification of the aortic valve. There is mild thickening of the aortic valve. Aortic valve regurgitation is not visualized. No aortic stenosis is present.   5. The inferior vena cava is normal in size with greater than 50% respiratory variability, suggesting right atrial pressure of 3 mmHg.   CT pelvis -  Left proximal thigh cellulitis with an intramuscular abscess  MRI - 1. Findings are highly suspicious for septic arthritis and osteomyelitis of the left hip and proximal left femur. The proximal extent of the pelvic involvement is not imaged. The sacroiliac joints are not included. 2. Diffuse edema and enhancement throughout the proximal left thigh musculature with multiple irregular peripherally enhancing fluid collections anteriorly in the proximal left thigh, largest superficial and lateral to the proximal rectus femoris muscle, suspicious for abscesses. 3. Incomplete visualization of the proximal extension of inflammation into the pelvis along the iliopsoas and obturator internus muscles. 4. Asymmetric subcutaneous edema within the visualized left pelvis and left thigh. 5. Recommend further evaluation of the pelvis (with CT or MRI) to better evaluate the proximal extent of the infection.      Disposition Plan  :    Status is: Inpatient   DVT Prophylaxis  :    enoxaparin  (LOVENOX ) injection 40 mg Start: 02/07/24 1115 SCDs Start: 01/31/24 2147  Lab Results  Component Value Date   PLT 332 02/09/2024    Diet :  Diet Order             Diet regular Room service appropriate? Yes; Fluid consistency: Thin  Diet effective now                    Inpatient Medications  Scheduled Meds:  (feeding supplement) PROSource Plus  30 mL Oral BID BM   docusate sodium   200 mg Oral BID   enoxaparin  (LOVENOX ) injection  40 mg Subcutaneous Daily   ferrous sulfate   325 mg  Oral Q breakfast   folic acid   1 mg Oral Daily   midodrine   10 mg Oral TID WC   multivitamin with minerals  1 tablet  Oral Daily   nystatin   Topical TID   pantoprazole   40 mg Oral Daily   polyethylene glycol  17 g Oral BID   QUEtiapine   50 mg Oral QHS   thiamine   100 mg Oral Daily   Continuous Infusions:  albumin human 50 g (02/11/24 2231)    ceFAZolin  (ANCEF ) IV 2 g (02/12/24 0635)   PRN Meds:.acetaminophen  **OR** acetaminophen , haloperidol  lactate, HYDROcodone -acetaminophen , lip balm, LORazepam , melatonin, naLOXone  (NARCAN )  injection, nicotine , ondansetron  (ZOFRAN ) IV, pneumococcal 20-valent conjugate vaccine    Objective:   Vitals:   02/11/24 1556 02/11/24 2143 02/12/24 0500 02/12/24 0729  BP: 100/60 (!) 102/57  (!) 100/55  Pulse: (!) 58   83  Resp: 18 18  19   Temp: 98.6 F (37 C) 99.8 F (37.7 C)  97.8 F (36.6 C)  TempSrc: Oral Oral  Oral  SpO2:    99%  Weight:   44.6 kg     Wt Readings from Last 3 Encounters:  02/12/24 44.6 kg  01/26/24 56.7 kg  01/26/24 56.7 kg     Intake/Output Summary (Last 24 hours) at 02/12/2024 0813 Last data filed at 02/12/2024 2956 Gross per 24 hour  Intake --  Output 2000 ml  Net -2000 ml     Physical Exam  Awake- alert, left knee and thigh laying and redness has improved, left hip and leg weakness as compared to the right [place].AT,PERRAL Supple Neck, No JVD,   Symmetrical Chest wall movement, Good air movement bilaterally, CTAB RRR,No Gallops,Rubs or new Murmurs,  +ve B.Sounds, Abd Soft, No tenderness,        Data Review:    Recent Labs  Lab 02/06/24 0749 02/07/24 0445 02/08/24 0506 02/09/24 0634  WBC 3.5* 4.2 6.3 7.1  HGB 9.6* 9.5* 10.3* 9.8*  HCT 29.3* 30.0* 32.0* 30.2*  PLT 259 316 352 332  MCV 88.3 89.0 89.9 89.9  MCH 28.9 28.2 28.9 29.2  MCHC 32.8 31.7 32.2 32.5  RDW 14.4 14.6 14.7 15.2  LYMPHSABS 1.1 1.4 1.6 2.1  MONOABS 0.4 0.4 0.6 0.7  EOSABS 0.3 0.3 0.2 0.2  BASOSABS 0.0 0.0 0.1 0.1    Recent Labs  Lab 02/06/24 0418 02/06/24 0749 02/07/24 0445 02/08/24 0506 02/08/24 0822 02/09/24 0634  NA  --  136 136 135   --  135  K  --  3.3* 3.8 3.7  --  3.4*  CL  --  104 103 100  --  100  CO2  --  25 26 23   --  24  ANIONGAP  --  7 7 12   --  11  GLUCOSE  --  101* 104* 101*  --  100*  BUN  --  <5* <5* <5*  --  <5*  CREATININE  --  0.52* 0.61 0.53*  --  0.61  AST  --  108* 115* 91*  --  80*  ALT  --  58* 63* 56*  --  49*  ALKPHOS  --  110 132* 115  --  117  BILITOT  --  0.6 0.4 0.5  --  0.4  ALBUMIN  --  2.0* 2.1* 2.3*  --  2.2*  CRP 5.0*  --  3.0* 1.5*  --   --   PROCALCITON  --  0.32 0.28 0.12  --  <0.10  TSH  --   --  2.633  --  3.042  --   MG  --  1.7 1.6* 1.7  --   --   CALCIUM  --  7.8* 8.4* 8.5*  --  8.5*      Recent Labs  Lab 02/06/24 0418 02/06/24 0749 02/07/24 0445 02/08/24 0506 02/08/24 0822 02/09/24 0634  CRP 5.0*  --  3.0* 1.5*  --   --   PROCALCITON  --  0.32 0.28 0.12  --  <0.10  TSH  --   --  2.633  --  3.042  --   MG  --  1.7 1.6* 1.7  --   --   CALCIUM  --  7.8* 8.4* 8.5*  --  8.5*    --------------------------------------------------------------------------------------------------------------- Lab Results  Component Value Date   CHOL 96 12/08/2023   HDL 34 (L) 12/08/2023   LDLCALC 51 12/08/2023   TRIG 55 12/08/2023   CHOLHDL 2.8 12/08/2023    Lab Results  Component Value Date   HGBA1C 6.0 (H) 12/08/2023   No results for input(s): "TSH", "T4TOTAL", "FREET4", "T3FREE", "THYROIDAB" in the last 72 hours.  No results for input(s): "VITAMINB12", "FOLATE", "FERRITIN", "TIBC", "IRON", "RETICCTPCT" in the last 72 hours. ------------------------------------------------------------------------------------------------------------------ Cardiac Enzymes No results for input(s): "CKMB", "TROPONINI", "MYOGLOBIN" in the last 168 hours.  Invalid input(s): "CK"    Radiology Report No results found.    Signature  -   Lynnwood Sauer M.D on 02/12/2024 at 8:13 AM   -  To page go to [email or web address]

## 2024-02-12 NOTE — Progress Notes (Signed)
 Occupational Therapy Treatment Patient Details Name: Mark Hammond MRN: [id number] DOB: 02-14-1991 Today's Date: 02/12/2024   History of present illness Pt is 33 yo presenting to [facility] ED on 4/30 due to L leg pain for several months. MRI of lumbar spine was negative. 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 Drainage of his left thigh abscess by IR on 02/05/2024 Pmh: Alcoholic gastritis, BPD, Depression, drug abuse, ETOH abuse, hallucination, schizo affective schizophrenia.   OT comments  Pt making steady progress towards OT goals. Session focused on UE HEP education (with theraband and handout provided) and AE for LB dressing. With education, pt able to return demo pants/sock mgmt with AE; issued reacher for pt use. Plan to trial other ways to manage socks in next session and continued work on WB tolerance through LLE (limited due to pain).       If plan is discharge home, recommend the following:  Assistance with cooking/housework;Direct supervision/assist for medications management;Assist for transportation;Help with stairs or ramp for entrance;A little help with bathing/dressing/bathroom   Equipment Recommendations  Other (comment) (RW)    Recommendations for Other Services      Precautions / Restrictions Precautions Precautions: Fall Restrictions Weight Bearing Restrictions Per Provider Order: No       Mobility Bed Mobility Overal bed mobility: Modified Independent Bed Mobility: Supine to Sit           General bed mobility comments: increased effort but pt able to manage without assist    Transfers Overall transfer level: Needs assistance Equipment used: None Transfers: Sit to/from Stand Sit to Stand: Supervision           General transfer comment: able to stand at bedside without AD to pull pants above waist. still limited LLE WB  noted     Balance Overall balance assessment: Needs assistance Sitting-balance support: Feet supported Sitting balance-Leahy Scale: Good     Standing balance support: Bilateral upper extremity supported, No upper extremity supported Standing balance-Leahy Scale: Fair                             ADL either performed or assessed with clinical judgement   ADL Overall ADL's : Needs assistance/impaired                 Upper Body Dressing : Set up;Bed level Upper Body Dressing Details (indicate cue type and reason): to don hospital gown Lower Body Dressing: Contact guard assist;Supervision/safety;With adaptive equipment;Sitting/lateral leans;Sit to/from stand Lower Body Dressing Details (indicate cue type and reason): Educated on use of AE for LB dressing tasks with pt able to easily return demo use of reacher to don paper scrub pants. pt able to don R sock without AE but educated on use of sock aide for L foot with cues for technique. Pt able to use reacher to straighten/pull up this sock. Issued reacher for pt personal use. May need to problem solve other ways to manage socks without need for AE for this due to pt baseline housing insecurity.               General ADL Comments: Pt does endorse pain when attempting to flex hip for sock mgmt but then able to demo squat to floor w/o observed pain. Reinforced EOB for meals with pt reporting he has done that. Educated on  UE HEP with handout using theraband    Extremity/Trunk Assessment Upper Extremity Assessment Upper Extremity Assessment: Overall WFL for tasks assessed;Right hand dominant   Lower Extremity Assessment Lower Extremity Assessment: Defer to PT evaluation        Vision   Vision Assessment?: No apparent visual deficits   Perception     Praxis     Communication Communication Communication: No apparent difficulties   Cognition Arousal: Alert Behavior During Therapy: WFL for tasks  assessed/performed Cognition: No family/caregiver present to determine baseline             OT - Cognition Comments: likely at baseline, minor cues needed for problem solving and safety but much improved from initial eval                 Following commands: Intact        Cueing   Cueing Techniques: Verbal cues, Gestural cues  Exercises Exercises: General Upper Extremity General Exercises - Upper Extremity Shoulder Flexion: Strengthening, Both, 5 reps, Seated, Theraband Theraband Level (Shoulder Flexion): Level 2 (Red) Shoulder Horizontal ABduction: Strengthening, Both, 5 reps, Seated, Theraband Theraband Level (Shoulder Horizontal Abduction): Level 2 (Red) Elbow Flexion: Strengthening, 5 reps, Both, Seated, Theraband Theraband Level (Elbow Flexion): Level 2 (Red) Elbow Extension: Strengthening, Both, 5 reps, Seated, Theraband Theraband Level (Elbow Extension): Level 2 (Red)    Shoulder Instructions       General Comments Pt reported IV uncomfortable and called out to desk earlier but pt reported he ended up pulling out IV- RN aware.    Pertinent Vitals/ Pain       Pain Assessment Pain Assessment: Faces Faces Pain Scale: Hurts even more Pain Location: L hip with flexion, external rotation Pain Descriptors / Indicators: Grimacing, Guarding, Aching Pain Intervention(s): Monitored during session  Home Living                                          Prior Functioning/Environment              Frequency  Min 2X/week        Progress Toward Goals  OT Goals(current goals can now be found in the care plan section)  Progress towards OT goals: Progressing toward goals  Acute Rehab OT Goals Patient Stated Goal: pain control OT Goal Formulation: With patient Time For Goal Achievement: 02/18/24 Potential to Achieve Goals: Good ADL Goals Pt Will Perform Upper Body Dressing: sitting;with contact guard assist Pt Will Perform Lower Body  Dressing: with contact guard assist;sitting/lateral leans;sit to/from stand Pt Will Transfer to Toilet: with contact guard assist;ambulating;regular height toilet  Plan      Co-evaluation                 AM-PAC OT "6 Clicks" Daily Activity     Outcome Measure   Help from another person eating meals?: None Help from another person taking care of personal grooming?: A Little Help from another person toileting, which includes using toliet, bedpan, or urinal?: A Lot Help from another person bathing (including washing, rinsing, drying)?: A Lot Help from another person to put on and taking off regular upper body clothing?: A Little Help from another person to put on and taking off regular lower body clothing?: A Little 6 Click Score: 17    End of Session    OT Visit Diagnosis: Unsteadiness on feet (R26.81);Other abnormalities of gait  and mobility (R26.89);Muscle weakness (generalized) (M62.81)   Activity Tolerance Patient tolerated treatment well;Patient limited by pain   Patient Left in bed;with call bell/phone within reach   Nurse Communication Mobility status;Other (comment) (IV)        Time: 1308-6578 OT Time Calculation (min): 28 min  Charges: OT General Charges $OT Visit: 1 Visit OT Treatments $Self Care/Home Management : 8-22 mins $Therapeutic Exercise: 8-22 mins  Lawrence Pretty, OTR/L Acute Rehab Services Office: [phone]   Annabella Barr 02/12/2024, 2:06 PM

## 2024-02-12 NOTE — Progress Notes (Signed)
 Pt pulled out is IV cath and did not finish his albumin and Mg.   I had the IV team come in and place another.  Pt stated IV team came to attempt and IV but he could not turn on his left d/t pain and wanted pain meds first. IV team left w/o IV access.   Pain meds given  Notified MD.

## 2024-02-12 NOTE — Progress Notes (Signed)
 Notified MD pt refused his stool softeners and lovenox  this morning. Looks like he has refused a couple of times this admission.   MD ordered PO Eliquis

## 2024-02-12 NOTE — Progress Notes (Signed)
 RN reporting patient having groin rash.  See image.  Appears to be intertrigo.  Keep area clean and dry.  Ketoconazole cream ordered.

## 2024-02-12 NOTE — Plan of Care (Signed)

## 2024-02-13 DIAGNOSIS — L03116 Cellulitis of left lower limb: Secondary | ICD-10-CM | POA: Diagnosis not present

## 2024-02-13 LAB — CULTURE, BLOOD (ROUTINE X 2)
Culture: NO GROWTH
Culture: NO GROWTH
Special Requests: ADEQUATE
Special Requests: ADEQUATE

## 2024-02-13 LAB — BASIC METABOLIC PANEL WITH GFR
Anion gap: 9 (ref 5–15)
BUN: 10 mg/dL (ref 6–20)
CO2: 24 mmol/L (ref 22–32)
Calcium: 9.8 mg/dL (ref 8.9–10.3)
Chloride: 103 mmol/L (ref 98–111)
Creatinine, Ser: 0.55 mg/dL — ABNORMAL LOW (ref 0.61–1.24)
GFR, Estimated: >60 mL/min (ref >60)
Glucose, Bld: 94 mg/dL (ref 70–99)
Potassium: 4.1 mmol/L (ref 3.5–5.1)
Sodium: 136 mmol/L (ref 135–145)

## 2024-02-13 LAB — MAGNESIUM: Magnesium: 2 mg/dL (ref 1.7–2.4)

## 2024-02-13 NOTE — Progress Notes (Signed)
 PROGRESS NOTE                                                                                                                                                                                                             Patient Demographics:    Mark Hammond, is a 33 y.o. male, DOB - 02-Oct-1991, UEA:[id number]  Outpatient Primary MD for the patient is Patient, No Pcp Per    LOS - 13  Admit date - 01/31/2024    Chief Complaint  Patient presents with   Leg Swelling   Generalized Pain       Brief Narrative (HPI from H&P)   33 y.o. male with PMH significant for polysubstance abuse including alcohol, tobacco, cocaine , heroin, MDMA, marijuana, bipolar disorder, schizoaffective schizophrenia, 4/30 patient presented to ED with complaint of left foot pain, swelling, warmth.  Further workup suggestive of diffuse septic arthritis of the left hip, proximal femur along with left hip and possible pelvic abscess.  He is currently septic.  Transferred to my care on 02/04/2024.    Subjective:   Patient in bed, appears comfortable, denies any headache, no fever, no chest pain or pressure, no shortness of breath , no abdominal pain. No new focal weakness.   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  Currently on Vancomycin  and Rocephin , orthopedics, PCCM and ID following, sepsis pathophysiology considerably improved, CT pelvis done Case discussed with Dr. Julio Ohm, patient has a left thigh soft tissue infection, knee per Dr. Julio Ohm looks stable, no event drainage of his left thigh abscess by IR on 02/05/2024 wound abscess culture growing MSSA, echo nonacute, continues to have left leg weakness despite pain improving will request neurology to evaluate as well.    ID on board, per ID continue IV antibiotics with IV cefazolin  while he is here.  Upon DC PO cefadroxil 1gm bid on discharge to complete 6 weeks from ABX eot  6/15. ID f/u on 5/21.  HX of polysubstance abuse including ongoing alcohol abuse, ongoing cocaine , amphetamine, cannabis abuse.  Counseled to abstain from all.  On CIWA protocol.   Hypomagnesemia and hypokalemia.  Replaced.    Anemia of chronic disease.  Monitor.  Type screen and check INR.  Mild asymptomatic hypotension, moderate PCM.  Stable TSH and random cortisol, reduce narcotics,  has been hydrated, placed on protein supplementation and monitor.  On midodrine .  Asymptomatic.    Left lower extremity weakness.  Mostly guarding from pain, seen by neurology, recent L-spine imaging outpatient stable.  Continue supportive care.  PT OT.  Outpatient neuro follow-up.  Weakness and deconditioning.  Does not want to sit in the chair, extremely reluctant to move or do any activity, soils on himself, now developing groin fungal infection.  Requested to stay clean, sit up in the chair, apply nystatin powder 3 times a day.       Condition - Extremely Guarded  Family Communication  :  None present  Code Status :  Full  Consults  :  Ortho, ID, PCCM, IR, neurology  PUD Prophylaxis :  PPI   Procedures  :     Left anterior thigh abscess aspiration by IR on 02/05/2024.    Echo -  1. Left ventricular ejection fraction, by estimation, is 60 to 65%. The left ventricle has normal function. The left ventricle has no regional wall motion abnormalities. Left ventricular diastolic parameters were normal.   2. Right ventricular systolic function is normal. The right ventricular size is normal. Tricuspid regurgitation signal is inadequate for assessing PA pressure.   3. The mitral valve is grossly normal. No evidence of mitral valve regurgitation. No evidence of mitral stenosis.   4. The aortic valve was not well visualized. There is mild calcification of the aortic valve. There is mild thickening of the aortic valve. Aortic valve regurgitation is not visualized. No aortic stenosis is present.   5. The  inferior vena cava is normal in size with greater than 50% respiratory variability, suggesting right atrial pressure of 3 mmHg.   CT pelvis -  Left proximal thigh cellulitis with an intramuscular abscess  MRI - 1. Findings are highly suspicious for septic arthritis and osteomyelitis of the left hip and proximal left femur. The proximal extent of the pelvic involvement is not imaged. The sacroiliac joints are not included. 2. Diffuse edema and enhancement throughout the proximal left thigh musculature with multiple irregular peripherally enhancing fluid collections anteriorly in the proximal left thigh, largest superficial and lateral to the proximal rectus femoris muscle, suspicious for abscesses. 3. Incomplete visualization of the proximal extension of inflammation into the pelvis along the iliopsoas and obturator internus muscles. 4. Asymmetric subcutaneous edema within the visualized left pelvis and left thigh. 5. Recommend further evaluation of the pelvis (with CT or MRI) to better evaluate the proximal extent of the infection.      Disposition Plan  :    Status is: Inpatient   DVT Prophylaxis  :    rivaroxaban (XARELTO) tablet 10 mg Start: 02/12/24 1115 SCDs Start: 01/31/24 2147 rivaroxaban (XARELTO) tablet 10 mg  Lab Results  Component Value Date   PLT 375 02/12/2024    Diet :  Diet Order             Diet regular Room service appropriate? Yes; Fluid consistency: Thin  Diet effective now                    Inpatient Medications  Scheduled Meds:  (feeding supplement) PROSource Plus  30 mL Oral BID BM   docusate sodium   200 mg Oral BID   ferrous sulfate   325 mg Oral Q breakfast   folic acid   1 mg Oral Daily   midodrine   10 mg Oral TID WC   multivitamin with minerals  1 tablet Oral Daily  nystatin   Topical TID   pantoprazole   40 mg Oral Daily   polyethylene glycol  17 g Oral BID   QUEtiapine   50 mg Oral QHS   rivaroxaban  10 mg Oral Daily   thiamine   100 mg Oral  Daily   Continuous Infusions:   ceFAZolin  (ANCEF ) IV 2 g (02/12/24 2304)   PRN Meds:.acetaminophen  **OR** acetaminophen , haloperidol  lactate, HYDROcodone -acetaminophen , lip balm, LORazepam , melatonin, naLOXone  (NARCAN )  injection, nicotine , ondansetron  (ZOFRAN ) IV, pneumococcal 20-valent conjugate vaccine    Objective:   Vitals:   02/12/24 2038 02/12/24 2304 02/13/24 0354 02/13/24 0816  BP: 107/66 104/62  (!) 107/56  Pulse: 74 64 74   Resp: 18 18 (P) 18   Temp: 99.2 F (37.3 C) 99.7 F (37.6 C) (P) 98.7 F (37.1 C) 97.8 F (36.6 C)  TempSrc: Oral Oral (P) Oral Oral  SpO2: 98% 99% (P) 99% 99%  Weight:        Wt Readings from Last 3 Encounters:  02/12/24 44.6 kg  01/26/24 56.7 kg  01/26/24 56.7 kg     Intake/Output Summary (Last 24 hours) at 02/13/2024 0945 Last data filed at 02/13/2024 8295 Gross per 24 hour  Intake 1462.83 ml  Output 2350 ml  Net -887.17 ml     Physical Exam  Awake- alert, left knee and thigh laying and redness has improved, left hip and leg weakness as compared to the right [place].AT,PERRAL Supple Neck, No JVD,   Symmetrical Chest wall movement, Good air movement bilaterally, CTAB RRR,No Gallops,Rubs or new Murmurs,  +ve B.Sounds, Abd Soft, No tenderness,        Data Review:    Recent Labs  Lab 02/07/24 0445 02/08/24 0506 02/09/24 0634 02/12/24 0801  WBC 4.2 6.3 7.1 5.2  HGB 9.5* 10.3* 9.8* 10.0*  HCT 30.0* 32.0* 30.2* 32.1*  PLT 316 352 332 375  MCV 89.0 89.9 89.9 92.2  MCH 28.2 28.9 29.2 28.7  MCHC 31.7 32.2 32.5 31.2  RDW 14.6 14.7 15.2 15.4  LYMPHSABS 1.4 1.6 2.1 1.9  MONOABS 0.4 0.6 0.7 0.4  EOSABS 0.3 0.2 0.2 0.2  BASOSABS 0.0 0.1 0.1 0.1    Recent Labs  Lab 02/07/24 0445 02/08/24 0506 02/08/24 0822 02/09/24 0634 02/12/24 0801 02/12/24 0826 02/13/24 0436  NA 136 135  --  135  --  139 136  K 3.8 3.7  --  3.4*  --  3.3* 4.1  CL 103 100  --  100  --  100 103  CO2 26 23  --  24  --  25 24  ANIONGAP 7 12  --  11  --   14 9  GLUCOSE 104* 101*  --  100*  --  115* 94  BUN <5* <5*  --  <5*  --  8 10  CREATININE 0.61 0.53*  --  0.61  --  0.64 0.55*  AST 115* 91*  --  80*  --  84*  --   ALT 63* 56*  --  49*  --  40  --   ALKPHOS 132* 115  --  117  --  111  --   BILITOT 0.4 0.5  --  0.4  --  0.4  --   ALBUMIN 2.1* 2.3*  --  2.2*  --  3.7  --   CRP 3.0* 1.5*  --   --  0.8  --   --   PROCALCITON 0.28 0.12  --  <0.10 <0.10  --   --  TSH 2.633  --  3.042  --   --   --   --   MG 1.6* 1.7  --   --  1.5*  --  2.0  PHOS  --   --   --   --  3.8  --   --   CALCIUM 8.4* 8.5*  --  8.5*  --  9.1 9.8      Recent Labs  Lab 02/07/24 0445 02/08/24 0506 02/08/24 0822 02/09/24 0634 02/12/24 0801 02/12/24 0826 02/13/24 0436  CRP 3.0* 1.5*  --   --  0.8  --   --   PROCALCITON 0.28 0.12  --  <0.10 <0.10  --   --   TSH 2.633  --  3.042  --   --   --   --   MG 1.6* 1.7  --   --  1.5*  --  2.0  CALCIUM 8.4* 8.5*  --  8.5*  --  9.1 9.8    --------------------------------------------------------------------------------------------------------------- Lab Results  Component Value Date   CHOL 96 12/08/2023   HDL 34 (L) 12/08/2023   LDLCALC 51 12/08/2023   TRIG 55 12/08/2023   CHOLHDL 2.8 12/08/2023    Lab Results  Component Value Date   HGBA1C 6.0 (H) 12/08/2023   No results for input(s): "TSH", "T4TOTAL", "FREET4", "T3FREE", "THYROIDAB" in the last 72 hours.  No results for input(s): "VITAMINB12", "FOLATE", "FERRITIN", "TIBC", "IRON", "RETICCTPCT" in the last 72 hours. ------------------------------------------------------------------------------------------------------------------ Cardiac Enzymes No results for input(s): "CKMB", "TROPONINI", "MYOGLOBIN" in the last 168 hours.  Invalid input(s): "CK"    Radiology Report No results found.    Signature  -   Lynnwood Sauer M.D on 02/13/2024 at 9:45 AM   -  To page go to [email or web address]

## 2024-02-13 NOTE — Progress Notes (Signed)
 Physical Therapy Treatment Patient Details Name: Mark Hammond MRN: [id number] DOB: [birth year] Today's Date: 02/13/2024   History of Present Illness Pt is 33 yo presenting to [facility] ED on 4/30 due to L leg pain for several months. MRI of lumbar spine was negative. 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 Drainage of his left thigh abscess by IR on 02/05/2024 Pmh: Alcoholic gastritis, BPD, Depression, drug abuse, ETOH abuse, hallucination, schizo affective schizophrenia.    PT Comments  Making steady progress towards functional goals. Emphasis on quality of gait, improving gait symmetry and WB tolerance through LLE which is slowly improving but still limited. Encouraged continued WB through LLE to increase ROM as tolerated, frequent OOB mobility with staff, and achilles stretch when in bed. Could consider PRAFO boot as well. Mod I with bed mobility, supervision with transfer and gait, RW for support. Patient will continue to benefit from skilled physical therapy services to further improve independence with functional mobility.     If plan is discharge home, recommend the following: Assist for transportation;Supervision due to cognitive status;A little help with walking and/or transfers;A little help with bathing/dressing/bathroom   Can travel by private Theme park manager cushion (measurements PT);Wheelchair (measurements PT) (pending progress)    Recommendations for Other Services       Precautions / Restrictions Precautions Precautions: Fall Recall of Precautions/Restrictions: Intact Restrictions Weight Bearing Restrictions Per Provider Order: No     Mobility  Bed Mobility Overal bed mobility: Modified Independent             General bed mobility comments: increased effort but pt able to manage without assist     Transfers Overall transfer level: Needs assistance Equipment used: Rolling walker (2 wheels) Transfers: Sit to/from Stand Sit to Stand: Supervision           General transfer comment: Supervision for safety, minimal WB through LLE. Cues for technique for safety, and to gradually increase WB through LLE as tolerated.    Ambulation/Gait Ambulation/Gait assistance: Supervision Gait Distance (Feet): 100 Feet Assistive device: Rolling walker (2 wheels) Gait Pattern/deviations: Step-to pattern, Decreased stance time - left, Decreased stride length, Decreased dorsiflexion - left, Decreased weight shift to left, Antalgic Gait velocity: dec Gait velocity interpretation: <1.31 ft/sec, indicative of household ambulator   General Gait Details: Working on symmetry of gait, Lt heel strike and gradually increasing heel flat, still limited by pain. He was able to take several steps with great effort and discomfort with heel flat through mid stance, but heavily reliant on RW for support. Supervision for safety.   Stairs             Wheelchair Mobility     Tilt Bed    Modified Rankin (Stroke Patients Only)       Balance Overall balance assessment: Needs assistance Sitting-balance support: Feet supported Sitting balance-Leahy Scale: Good Sitting balance - Comments: Sitting more symmetrically today, able to bear weight through Lt hip.   Standing balance support: Single extremity supported, During functional activity Standing balance-Leahy Scale: Poor Standing balance comment: Pt requries external support                            Communication Communication Communication: No apparent difficulties  Cognition Arousal: Alert Behavior During Therapy: [facility] for  tasks assessed/performed   PT - Cognitive impairments: No apparent impairments                         Following commands: Intact Following commands impaired: Follows multi-step commands  inconsistently    Cueing Cueing Techniques: Verbal cues, Gestural cues  Exercises Other Exercises Other Exercises: States he has been consistent with LLE ROM reviewed last visit.    General Comments        Pertinent Vitals/Pain Pain Assessment Pain Assessment: Faces Faces Pain Scale: Hurts little more Pain Location: L hip with flexion, external rotation Pain Descriptors / Indicators: Grimacing, Guarding, Aching Pain Intervention(s): Monitored during session, Repositioned, Limited activity within patient's tolerance    Home Living                          Prior Function            PT Goals (current goals can now be found in the care plan section) Acute Rehab PT Goals Patient Stated Goal: Get well PT Goal Formulation: With patient Time For Goal Achievement: 02/18/24 Potential to Achieve Goals: Fair Progress towards PT goals: Progressing toward goals    Frequency    Min 2X/week      PT Plan      Co-evaluation              AM-PAC PT "6 Clicks" Mobility   Outcome Measure  Help needed turning from your back to your side while in a flat bed without using bedrails?: A Little Help needed moving from lying on your back to sitting on the side of a flat bed without using bedrails?: A Little Help needed moving to and from a bed to a chair (including a wheelchair)?: A Little Help needed standing up from a chair using your arms (e.g., wheelchair or bedside chair)?: A Little Help needed to walk in hospital room?: A Little Help needed climbing 3-5 steps with a railing? : Total 6 Click Score: 16    End of Session Equipment Utilized During Treatment: Gait belt Activity Tolerance: Patient tolerated treatment well Patient left: in bed;with call bell/phone within reach;with bed alarm set Nurse Communication: Mobility status (Requests shower) PT Visit Diagnosis: Unsteadiness on feet (R26.81);Other abnormalities of gait and mobility (R26.89);Pain Pain -  Right/Left: Left Pain - part of body: Hip     Time: 8295-6213 PT Time Calculation (min) (ACUTE ONLY): 20 min  Charges:    $Gait Training: 8-22 mins PT General Charges $$ ACUTE PT VISIT: 1 Visit                     Jory Ng, PT, DPT [facility]  Rehabilitation Services Physical Therapist Office: [phone] Website: [email or web address]    Alinda Irani 02/13/2024, 4:25 PM

## 2024-02-13 NOTE — TOC Progression Note (Signed)
 Transition of Care [facility]) - Progression Note    Patient Details  Name: Mark Hammond MRN: [id number] Date of Birth: 07/03/1991  Transition of Care [facility]) CM/SW Contact  Jannice Mends, LCSW Phone Number: 02/13/2024, 12:15 PM  Clinical Narrative:    Patient remains in hospital for medical treatment.    Expected Discharge Plan: Home/Self Care Barriers to Discharge: Continued Medical Work up, Homeless with medical needs  Expected Discharge Plan and Services                                               Social Determinants of Health (SDOH) Interventions SDOH Screenings   Food Insecurity: Patient Unable To Answer (02/02/2024)  Recent Concern: Food Insecurity - Food Insecurity Present (12/06/2023)  Housing: Patient Unable To Answer (02/02/2024)  Transportation Needs: Patient Unable To Answer (02/02/2024)  Recent Concern: Transportation Needs - Unmet Transportation Needs (01/21/2024)  Utilities: Patient Unable To Answer (02/02/2024)  Alcohol Screen: High Risk (02/23/2023)  Depression (PHQ2-9): Low Risk  (01/17/2024)  Recent Concern: Depression (PHQ2-9) - High Risk (01/14/2024)  Tobacco Use: High Risk (01/31/2024)    Readmission Risk Interventions    02/02/2024   11:06 AM  Readmission Risk Prevention Plan  Transportation Screening Complete  Medication Review (RN Care Manager) Complete  PCP or Specialist appointment within 3-5 days of discharge Complete  HRI or Home Care Consult Complete  SW Recovery Care/Counseling Consult Complete  Palliative Care Screening Not Applicable  Skilled Nursing Facility Not Applicable

## 2024-02-13 NOTE — Plan of Care (Signed)

## 2024-02-14 NOTE — Progress Notes (Signed)
 PROGRESS NOTE                                                                                                                                                                                                             Patient Demographics:    Mark Hammond, is a 33 y.o. male, DOB - Feb 06, 1991, NGE:[id number]  Outpatient Primary MD for the patient is Patient, No Pcp Per    LOS - 14  Admit date - 01/31/2024    Chief Complaint  Patient presents with   Leg Swelling   Generalized Pain       Brief Narrative (HPI from H&P)   33 y.o. male with PMH significant for polysubstance abuse including alcohol, tobacco, cocaine , heroin, MDMA, marijuana, bipolar disorder, schizoaffective schizophrenia, 4/30 patient presented to ED with complaint of left foot pain, swelling, warmth.  Further workup suggestive of diffuse septic arthritis of the left hip, proximal femur along with left hip and possible pelvic abscess.  He is currently septic.  Transferred to my care on 02/04/2024.    Subjective:   Patient in bed, patient appears comfortable, no apparent distress, he denies any new complaints today .   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   -Currently on Vancomycin  and Rocephin , orthopedics, PCCM and ID following, sepsis pathophysiology considerably improved, CT pelvis done Case discussed with Dr. Julio Ohm, patient has a left thigh soft tissue infection, knee per Dr. Julio Ohm looks stable, no event drainage of his left thigh abscess by IR on 02/05/2024 wound abscess culture growing MSSA, echo nonacute, continues to have left leg weakness despite pain improving will request neurology to evaluate as well.    ID on board, per ID continue IV antibiotics with IV cefazolin  while he is here.  Upon DC PO cefadroxil 1gm bid on discharge to complete 6 weeks from ABX eot 6/15. ID f/u on 5/21.  HX of polysubstance abuse  including ongoing alcohol abuse, ongoing cocaine , amphetamine, cannabis abuse.  Counseled to abstain from all.  On CIWA protocol.   Hypomagnesemia and hypokalemia.  Replaced.    Anemia of chronic disease.  Monitor.  Type screen and check INR.  Mild asymptomatic hypotension, moderate PCM.  Stable TSH and random cortisol, reduce narcotics, has been hydrated, placed on protein supplementation and monitor.  On midodrine .  Asymptomatic.    Left lower extremity weakness.  Mostly guarding from pain, seen by neurology, recent L-spine imaging outpatient stable.  Continue supportive care.  PT OT.  Outpatient neuro follow-up.  Weakness and deconditioning.  Does not want to sit in the chair, extremely reluctant to move or do any activity, soils on himself, now developing groin fungal infection.  Requested to stay clean, sit up in the chair, apply nystatin powder 3 times a day.  Discussed with staff, encouraged for patient to stay dry, sit on the chair and ambulate with therapy       Condition - Extremely Guarded  Family Communication  :  None present  Code Status :  Full  Consults  :  Ortho, ID, PCCM, IR, neurology  PUD Prophylaxis :  PPI   Procedures  :     Left anterior thigh abscess aspiration by IR on 02/05/2024.    Echo -  1. Left ventricular ejection fraction, by estimation, is 60 to 65%. The left ventricle has normal function. The left ventricle has no regional wall motion abnormalities. Left ventricular diastolic parameters were normal.   2. Right ventricular systolic function is normal. The right ventricular size is normal. Tricuspid regurgitation signal is inadequate for assessing PA pressure.   3. The mitral valve is grossly normal. No evidence of mitral valve regurgitation. No evidence of mitral stenosis.   4. The aortic valve was not well visualized. There is mild calcification of the aortic valve. There is mild thickening of the aortic valve. Aortic valve regurgitation is not  visualized. No aortic stenosis is present.   5. The inferior vena cava is normal in size with greater than 50% respiratory variability, suggesting right atrial pressure of 3 mmHg.   CT pelvis -  Left proximal thigh cellulitis with an intramuscular abscess  MRI - 1. Findings are highly suspicious for septic arthritis and osteomyelitis of the left hip and proximal left femur. The proximal extent of the pelvic involvement is not imaged. The sacroiliac joints are not included. 2. Diffuse edema and enhancement throughout the proximal left thigh musculature with multiple irregular peripherally enhancing fluid collections anteriorly in the proximal left thigh, largest superficial and lateral to the proximal rectus femoris muscle, suspicious for abscesses. 3. Incomplete visualization of the proximal extension of inflammation into the pelvis along the iliopsoas and obturator internus muscles. 4. Asymmetric subcutaneous edema within the visualized left pelvis and left thigh. 5. Recommend further evaluation of the pelvis (with CT or MRI) to better evaluate the proximal extent of the infection.      Disposition Plan  :    Status is: Inpatient   DVT Prophylaxis  :    rivaroxaban (XARELTO) tablet 10 mg Start: 02/12/24 1115 SCDs Start: 01/31/24 2147 rivaroxaban (XARELTO) tablet 10 mg  Lab Results  Component Value Date   PLT 375 02/12/2024    Diet :  Diet Order             Diet regular Room service appropriate? Yes; Fluid consistency: Thin  Diet effective now                    Inpatient Medications  Scheduled Meds:  (feeding supplement) PROSource Plus  30 mL Oral BID BM   docusate sodium   200 mg Oral BID   ferrous sulfate   325 mg Oral Q breakfast   folic acid   1 mg Oral Daily   midodrine   10 mg Oral TID WC  multivitamin with minerals  1 tablet Oral Daily   nystatin   Topical TID   pantoprazole   40 mg Oral Daily   polyethylene glycol  17 g Oral BID   QUEtiapine   50 mg Oral QHS    rivaroxaban  10 mg Oral Daily   thiamine   100 mg Oral Daily   Continuous Infusions:   ceFAZolin  (ANCEF ) IV Stopped (02/14/24 0643)   PRN Meds:.acetaminophen  **OR** acetaminophen , haloperidol  lactate, HYDROcodone -acetaminophen , lip balm, LORazepam , melatonin, naLOXone  (NARCAN )  injection, nicotine , ondansetron  (ZOFRAN ) IV, pneumococcal 20-valent conjugate vaccine    Objective:   Vitals:   02/14/24 0415 02/14/24 0500 02/14/24 0737 02/14/24 1145  BP: (!) 97/56  (!) 101/50 (!) 96/54  Pulse: 66  67 60  Resp: 16  18 18   Temp: 97.8 F (36.6 C)  98.2 F (36.8 C) 98 F (36.7 C)  TempSrc: Oral  Oral Oral  SpO2: 99%  98%   Weight:  56.7 kg    Height: 5\' 3"  (1.6 m)       Wt Readings from Last 3 Encounters:  02/14/24 56.7 kg  01/26/24 56.7 kg  01/26/24 56.7 kg     Intake/Output Summary (Last 24 hours) at 02/14/2024 1340 Last data filed at 02/14/2024 0650 Gross per 24 hour  Intake 1620 ml  Output 1550 ml  Net 70 ml     Physical Exam   Awake Alert, Oriented X 3, frail Symmetrical Chest wall movement, Good air movement bilaterally, CTAB RRR,No Gallops,Rubs or new Murmurs, No Parasternal Heave +ve B.Sounds, Abd Soft, No tenderness, No rebound - guarding or rigidity. No Cyanosis, Clubbing or edema, left knee redness and erythema has improved      Data Review:    Recent Labs  Lab 02/08/24 0506 02/09/24 0634 02/12/24 0801  WBC 6.3 7.1 5.2  HGB 10.3* 9.8* 10.0*  HCT 32.0* 30.2* 32.1*  PLT 352 332 375  MCV 89.9 89.9 92.2  MCH 28.9 29.2 28.7  MCHC 32.2 32.5 31.2  RDW 14.7 15.2 15.4  LYMPHSABS 1.6 2.1 1.9  MONOABS 0.6 0.7 0.4  EOSABS 0.2 0.2 0.2  BASOSABS 0.1 0.1 0.1    Recent Labs  Lab 02/08/24 0506 02/08/24 0822 02/09/24 0634 02/12/24 0801 02/12/24 0826 02/13/24 0436  NA 135  --  135  --  139 136  K 3.7  --  3.4*  --  3.3* 4.1  CL 100  --  100  --  100 103  CO2 23  --  24  --  25 24  ANIONGAP 12  --  11  --  14 9  GLUCOSE 101*  --  100*  --  115* 94  BUN  <5*  --  <5*  --  8 10  CREATININE 0.53*  --  0.61  --  0.64 0.55*  AST 91*  --  80*  --  84*  --   ALT 56*  --  49*  --  40  --   ALKPHOS 115  --  117  --  111  --   BILITOT 0.5  --  0.4  --  0.4  --   ALBUMIN 2.3*  --  2.2*  --  3.7  --   CRP 1.5*  --   --  0.8  --   --   PROCALCITON 0.12  --  <0.10 <0.10  --   --   TSH  --  3.042  --   --   --   --  MG 1.7  --   --  1.5*  --  2.0  PHOS  --   --   --  3.8  --   --   CALCIUM 8.5*  --  8.5*  --  9.1 9.8      Recent Labs  Lab 02/08/24 0506 02/08/24 0822 02/09/24 0634 02/12/24 0801 02/12/24 0826 02/13/24 0436  CRP 1.5*  --   --  0.8  --   --   PROCALCITON 0.12  --  <0.10 <0.10  --   --   TSH  --  3.042  --   --   --   --   MG 1.7  --   --  1.5*  --  2.0  CALCIUM 8.5*  --  8.5*  --  9.1 9.8    --------------------------------------------------------------------------------------------------------------- Lab Results  Component Value Date   CHOL 96 12/08/2023   HDL 34 (L) 12/08/2023   LDLCALC 51 12/08/2023   TRIG 55 12/08/2023   CHOLHDL 2.8 12/08/2023    Lab Results  Component Value Date   HGBA1C 6.0 (H) 12/08/2023   No results for input(s): "TSH", "T4TOTAL", "FREET4", "T3FREE", "THYROIDAB" in the last 72 hours.  No results for input(s): "VITAMINB12", "FOLATE", "FERRITIN", "TIBC", "IRON", "RETICCTPCT" in the last 72 hours. ------------------------------------------------------------------------------------------------------------------ Cardiac Enzymes No results for input(s): "CKMB", "TROPONINI", "MYOGLOBIN" in the last 168 hours.  Invalid input(s): "CK"    Radiology Report No results found.    Signature  -   Seena Dadds M.D on 02/14/2024 at 1:40 PM   -  To page go to [email or web address]

## 2024-02-14 NOTE — Plan of Care (Signed)

## 2024-02-15 NOTE — TOC Progression Note (Signed)
 Transition of Care [facility]) - Progression Note    Patient Details  Name: Mark Hammond MRN: [id number] Date of Birth: 22-Dec-1990  Transition of Care [facility]) CM/SW Contact  Jannice Mends, LCSW Phone Number: 02/15/2024, 3:28 PM  Clinical Narrative:    TOC continuing to follow for discharge needs.    Expected Discharge Plan: Home/Self Care Barriers to Discharge: Continued Medical Work up, Homeless with medical needs  Expected Discharge Plan and Services                                               Social Determinants of Health (SDOH) Interventions SDOH Screenings   Food Insecurity: Patient Unable To Answer (02/02/2024)  Recent Concern: Food Insecurity - Food Insecurity Present (12/06/2023)  Housing: Patient Unable To Answer (02/02/2024)  Transportation Needs: Patient Unable To Answer (02/02/2024)  Recent Concern: Transportation Needs - Unmet Transportation Needs (01/21/2024)  Utilities: Patient Unable To Answer (02/02/2024)  Alcohol Screen: High Risk (02/23/2023)  Depression (PHQ2-9): Low Risk  (01/17/2024)  Recent Concern: Depression (PHQ2-9) - High Risk (01/14/2024)  Tobacco Use: High Risk (01/31/2024)    Readmission Risk Interventions    02/02/2024   11:06 AM  Readmission Risk Prevention Plan  Transportation Screening Complete  Medication Review (RN Care Manager) Complete  PCP or Specialist appointment within 3-5 days of discharge Complete  HRI or Home Care Consult Complete  SW Recovery Care/Counseling Consult Complete  Palliative Care Screening Not Applicable  Skilled Nursing Facility Not Applicable

## 2024-02-15 NOTE — Progress Notes (Signed)
 PROGRESS NOTE                                                                                                                                                                                                             Patient Demographics:    Mark Hammond, is a 33 y.o. male, DOB - [birth year], ZOX:[id number]  Outpatient Primary MD for the patient is Patient, No Pcp Per    LOS - 15  Admit date - 01/31/2024    Chief Complaint  Patient presents with   Leg Swelling   Generalized Pain       Brief Narrative (HPI from H&P)   33 y.o. male with PMH significant for polysubstance abuse including alcohol, tobacco, cocaine , heroin, MDMA, marijuana, bipolar disorder, schizoaffective schizophrenia, 4/30 patient presented to ED with complaint of left foot pain, swelling, warmth.  Further workup suggestive of diffuse septic arthritis of the left hip, proximal femur along with left hip and possible pelvic abscess.  He is currently septic.  Transferred to my care on 02/04/2024.    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   -Currently on Vancomycin  and Rocephin , orthopedics, PCCM and ID following, sepsis pathophysiology considerably improved, CT pelvis done Case discussed with Dr. Julio Ohm, patient has a left thigh soft tissue infection, knee per Dr. Julio Ohm looks stable, no event drainage of his left thigh abscess by IR on 02/05/2024 wound abscess culture growing MSSA, echo nonacute, continues to have left leg weakness despite pain improving, worked with PT today, will order [facility] boot .    ID on board, per ID continue IV antibiotics with IV cefazolin  while he is here.  Upon DC PO cefadroxil 1gm bid on discharge to complete 6 weeks from ABX eot 6/15. ID f/u on  5/21.  HX of polysubstance abuse including ongoing alcohol abuse, ongoing cocaine , amphetamine, cannabis abuse.  Counseled to abstain from all.  On CIWA protocol.   Hypomagnesemia and hypokalemia.  Replaced.    Anemia of chronic disease.  Monitor.  Type screen and check INR.  Mild asymptomatic hypotension, moderate PCM.  Stable TSH and random cortisol, reduce narcotics, has been hydrated,  placed on protein supplementation and monitor.  On midodrine .  Asymptomatic.    Left lower extremity weakness.  Mostly guarding from pain, seen by neurology, recent L-spine imaging outpatient stable.  Continue supportive care.  PT OT.  Outpatient neuro follow-up.  Weakness and deconditioning.  Does not want to sit in the chair, extremely reluctant to move or do any activity, soils on himself, now developing groin fungal infection.  Requested to stay clean, sit up in the chair, apply nystatin powder 3 times a day.  Discussed with staff, encouraged for patient to stay dry, sit on the chair and ambulate with therapy       Condition - Extremely Guarded  Family Communication  :  None present  Code Status :  Full  Consults  :  Ortho, ID, PCCM, IR, neurology  PUD Prophylaxis :  PPI   Procedures  :     Left anterior thigh abscess aspiration by IR on 02/05/2024.    Echo -  1. Left ventricular ejection fraction, by estimation, is 60 to 65%. The left ventricle has normal function. The left ventricle has no regional wall motion abnormalities. Left ventricular diastolic parameters were normal.   2. Right ventricular systolic function is normal. The right ventricular size is normal. Tricuspid regurgitation signal is inadequate for assessing PA pressure.   3. The mitral valve is grossly normal. No evidence of mitral valve regurgitation. No evidence of mitral stenosis.   4. The aortic valve was not well visualized. There is mild calcification of the aortic valve. There is mild thickening of the aortic valve. Aortic  valve regurgitation is not visualized. No aortic stenosis is present.   5. The inferior vena cava is normal in size with greater than 50% respiratory variability, suggesting right atrial pressure of 3 mmHg.   CT pelvis -  Left proximal thigh cellulitis with an intramuscular abscess  MRI - 1. Findings are highly suspicious for septic arthritis and osteomyelitis of the left hip and proximal left femur. The proximal extent of the pelvic involvement is not imaged. The sacroiliac joints are not included. 2. Diffuse edema and enhancement throughout the proximal left thigh musculature with multiple irregular peripherally enhancing fluid collections anteriorly in the proximal left thigh, largest superficial and lateral to the proximal rectus femoris muscle, suspicious for abscesses. 3. Incomplete visualization of the proximal extension of inflammation into the pelvis along the iliopsoas and obturator internus muscles. 4. Asymmetric subcutaneous edema within the visualized left pelvis and left thigh. 5. Recommend further evaluation of the pelvis (with CT or MRI) to better evaluate the proximal extent of the infection.      Disposition Plan  :    Status is: Inpatient   DVT Prophylaxis  :    rivaroxaban (XARELTO) tablet 10 mg Start: 02/12/24 1115 SCDs Start: 01/31/24 2147 rivaroxaban (XARELTO) tablet 10 mg  Lab Results  Component Value Date   PLT 375 02/12/2024    Diet :  Diet Order             Diet regular Room service appropriate? Yes; Fluid consistency: Thin  Diet effective now                    Inpatient Medications  Scheduled Meds:  (feeding supplement) PROSource Plus  30 mL Oral BID BM   docusate sodium   200 mg Oral BID   ferrous sulfate   325 mg Oral Q breakfast   folic acid   1 mg Oral Daily   midodrine   10 mg Oral TID WC   multivitamin with minerals  1 tablet Oral Daily   nystatin   Topical TID   pantoprazole   40 mg Oral Daily   polyethylene glycol  17 g Oral BID    QUEtiapine   50 mg Oral QHS   rivaroxaban  10 mg Oral Daily   thiamine   100 mg Oral Daily   Continuous Infusions:   ceFAZolin  (ANCEF ) IV 2 g (02/15/24 1348)   PRN Meds:.acetaminophen  **OR** acetaminophen , haloperidol  lactate, HYDROcodone -acetaminophen , lip balm, LORazepam , melatonin, naLOXone  (NARCAN )  injection, nicotine , ondansetron  (ZOFRAN ) IV, pneumococcal 20-valent conjugate vaccine    Objective:   Vitals:   02/15/24 0328 02/15/24 0500 02/15/24 0728 02/15/24 0729  BP: (!) 98/53   (!) 96/55  Pulse: 62     Resp: 18     Temp: 98 F (36.7 C)  98.2 F (36.8 C) 98.2 F (36.8 C)  TempSrc: Oral  Oral Oral  SpO2: 100%   100%  Weight:  57.3 kg    Height:        Wt Readings from Last 3 Encounters:  02/15/24 57.3 kg  01/26/24 56.7 kg  01/26/24 56.7 kg     Intake/Output Summary (Last 24 hours) at 02/15/2024 1419 Last data filed at 02/15/2024 0534 Gross per 24 hour  Intake 480 ml  Output 400 ml  Net 80 ml     Physical Exam   Awake Alert, Oriented X 3, No new F.N deficits, Normal affect Symmetrical Chest wall movement, Good air movement bilaterally, CTAB RRR,No Gallops,Rubs or new Murmurs, No Parasternal Heave +ve B.Sounds, Abd Soft, No tenderness, No rebound - guarding or rigidity. No Cyanosis, Clubbing or edema, left knee redness and erythema has improved      Data Review:    Recent Labs  Lab 02/09/24 0634 02/12/24 0801  WBC 7.1 5.2  HGB 9.8* 10.0*  HCT 30.2* 32.1*  PLT 332 375  MCV 89.9 92.2  MCH 29.2 28.7  MCHC 32.5 31.2  RDW 15.2 15.4  LYMPHSABS 2.1 1.9  MONOABS 0.7 0.4  EOSABS 0.2 0.2  BASOSABS 0.1 0.1    Recent Labs  Lab 02/09/24 0634 02/12/24 0801 02/12/24 0826 02/13/24 0436  NA 135  --  139 136  K 3.4*  --  3.3* 4.1  CL 100  --  100 103  CO2 24  --  25 24  ANIONGAP 11  --  14 9  GLUCOSE 100*  --  115* 94  BUN <5*  --  8 10  CREATININE 0.61  --  0.64 0.55*  AST 80*  --  84*  --   ALT 49*  --  40  --   ALKPHOS 117  --  111  --    BILITOT 0.4  --  0.4  --   ALBUMIN 2.2*  --  3.7  --   CRP  --  0.8  --   --   PROCALCITON <0.10 <0.10  --   --   MG  --  1.5*  --  2.0  PHOS  --  3.8  --   --   CALCIUM 8.5*  --  9.1 9.8      Recent Labs  Lab 02/09/24 0634 02/12/24 0801 02/12/24 0826 02/13/24 0436  CRP  --  0.8  --   --   PROCALCITON <0.10 <0.10  --   --   MG  --  1.5*  --  2.0  CALCIUM 8.5*  --  9.1 9.8    ---------------------------------------------------------------------------------------------------------------  Lab Results  Component Value Date   CHOL 96 12/08/2023   HDL 34 (L) 12/08/2023   LDLCALC 51 12/08/2023   TRIG 55 12/08/2023   CHOLHDL 2.8 12/08/2023    Lab Results  Component Value Date   HGBA1C 6.0 (H) 12/08/2023   No results for input(s): "TSH", "T4TOTAL", "FREET4", "T3FREE", "THYROIDAB" in the last 72 hours.  No results for input(s): "VITAMINB12", "FOLATE", "FERRITIN", "TIBC", "IRON", "RETICCTPCT" in the last 72 hours. ------------------------------------------------------------------------------------------------------------------ Cardiac Enzymes No results for input(s): "CKMB", "TROPONINI", "MYOGLOBIN" in the last 168 hours.  Invalid input(s): "CK"    Radiology Report No results found.    Signature  -   Seena Dadds M.D on 02/15/2024 at 2:19 PM   -  To page go to [email or web address]

## 2024-02-15 NOTE — Progress Notes (Signed)
 Orthopedic Tech Progress Note Patient Details:  Mark Hammond [birth year] [id number]  Ortho Devices Type of Ortho Device: Prafo boot/shoe Ortho Device/Splint Location: LLE Ortho Device/Splint Interventions: Ordered, Application, Adjustment   Post Interventions Patient Tolerated: Fair Instructions Provided: Care of device, Adjustment of device  Mark Hammond 02/15/2024, 5:02 PM

## 2024-02-15 NOTE — Plan of Care (Signed)
 Pt has rested quietly throughout the night with no distress noted. Alert and oriented. On room air. Pt is not on tele monitor. Voids per urinal. Medicated for pain twice with relief noted. No other complaints voiced.     Problem: Education: Goal: Knowledge of General Education information will improve Description: Including pain rating scale, medication(s)/side effects and non-pharmacologic comfort measures Outcome: Progressing   Problem: Health Behavior/Discharge Planning: Goal: Ability to manage health-related needs will improve Outcome: Progressing   Problem: Clinical Measurements: Goal: Respiratory complications will improve Outcome: Progressing Goal: Cardiovascular complication will be avoided Outcome: Progressing   Problem: Coping: Goal: Level of anxiety will decrease Outcome: Progressing   Problem: Pain Managment: Goal: General experience of comfort will improve and/or be controlled Outcome: Progressing

## 2024-02-15 NOTE — Progress Notes (Signed)
 Physical Therapy Treatment Patient Details Name: Mark Hammond MRN: [id number] DOB: 09/09/1991 Today's Date: 02/15/2024   History of Present Illness Pt is 33 yo presenting to [facility] ED on 4/30 due to L leg pain for several months. MRI of lumbar spine was negative. 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 Drainage of his left thigh abscess by IR on 02/05/2024 Pmh: Alcoholic gastritis, BPD, Depression, drug abuse, ETOH abuse, hallucination, schizo affective schizophrenia.    PT Comments  Making progress, gradually increasing distance and WB tolerance through LLE, but still quite limited due to pain and prolonged period of time walking on his forefoot. Further encouragement for frequent ankle dorsiflexion exercises AROM and PROM, WB as tolerated with Rw for support. PRAFO to help increase prolonged stretch periods when resting. Patient will continue to benefit from skilled physical therapy services to further improve independence with functional mobility.     If plan is discharge home, recommend the following: Assist for transportation;Supervision due to cognitive status;A little help with walking and/or transfers;A little help with bathing/dressing/bathroom   Can travel by private Theme park manager cushion (measurements PT);Wheelchair (measurements PT) (pending progress)    Recommendations for Other Services       Precautions / Restrictions Precautions Precautions: Fall Recall of Precautions/Restrictions: Intact Restrictions Weight Bearing Restrictions Per Provider Order: No     Mobility  Bed Mobility Overal bed mobility: Modified Independent                  Transfers Overall transfer level: Needs assistance Equipment used: Rolling walker (2 wheels) Transfers: Sit to/from Stand Sit to Stand: Supervision            General transfer comment: Supervision for safety, RW for support, stable with device, no physical assist required.    Ambulation/Gait Ambulation/Gait assistance: Supervision Gait Distance (Feet): 130 Feet Assistive device: Rolling walker (2 wheels) Gait Pattern/deviations: Step-to pattern, Decreased stance time - left, Decreased stride length, Decreased dorsiflexion - left, Decreased weight shift to left, Antalgic Gait velocity: dec Gait velocity interpretation: <1.31 ft/sec, indicative of household ambulator   General Gait Details: Continued efforts to increase gait symmetry, with Lt heel strike, foot flat in mid stance, and improving weight bearing through LLE. Still requires RW to unload comfortably. No LOB. Supervision for safety.   Stairs             Wheelchair Mobility     Tilt Bed    Modified Rankin (Stroke Patients Only)       Balance Overall balance assessment: Needs assistance Sitting-balance support: Feet supported Sitting balance-Leahy Scale: Good Sitting balance - Comments: Sitting more symmetrically today, able to bear weight through Lt hip.   Standing balance support: During functional activity, No upper extremity supported Standing balance-Leahy Scale: Fair Standing balance comment: Can stand on RLE without LOB, Lt toe touching.                            Communication Communication Communication: No apparent difficulties  Cognition Arousal: Alert Behavior During Therapy: WFL for tasks assessed/performed   PT - Cognitive impairments: No apparent impairments                         Following commands: Intact  Cueing Cueing Techniques: Verbal cues, Gestural cues  Exercises Other Exercises Other Exercises: Reviewed AROM ankle DF, PROM DF, knee extended and flexed. Educated on [employer] use - requested order from MD.    General Comments        Pertinent Vitals/Pain Pain Assessment Pain Assessment:  Faces Faces Pain Scale: Hurts little more Pain Location: Lt groin, back of knee when weight bearing Pain Descriptors / Indicators: Grimacing, Guarding, Aching Pain Intervention(s): Monitored during session, Repositioned    Home Living                          Prior Function            PT Goals (current goals can now be found in the care plan section) Acute Rehab PT Goals Patient Stated Goal: Get well PT Goal Formulation: With patient Time For Goal Achievement: 02/18/24 Potential to Achieve Goals: Fair Progress towards PT goals: Progressing toward goals    Frequency    Min 2X/week      PT Plan      Co-evaluation              AM-PAC PT "6 Clicks" Mobility   Outcome Measure  Help needed turning from your back to your side while in a flat bed without using bedrails?: None Help needed moving from lying on your back to sitting on the side of a flat bed without using bedrails?: None Help needed moving to and from a bed to a chair (including a wheelchair)?: A Little Help needed standing up from a chair using your arms (e.g., wheelchair or bedside chair)?: A Little Help needed to walk in hospital room?: A Little Help needed climbing 3-5 steps with a railing? : Total 6 Click Score: 18    End of Session Equipment Utilized During Treatment: Gait belt Activity Tolerance: Patient tolerated treatment well Patient left: in bed;with call bell/phone within reach;with bed alarm set   PT Visit Diagnosis: Unsteadiness on feet (R26.81);Other abnormalities of gait and mobility (R26.89);Pain Pain - Right/Left: Left Pain - part of body: Hip     Time: 1610-9604 PT Time Calculation (min) (ACUTE ONLY): 36 min  Charges:    $Gait Training: 23-37 mins PT General Charges $$ ACUTE PT VISIT: 1 Visit                     Jory Ng, PT, DPT [facility]  Rehabilitation Services Physical Therapist Office: [phone] Website: [email or web address]    Alinda Irani 02/15/2024, 2:27 PM

## 2024-02-16 ENCOUNTER — Other Ambulatory Visit (HOSPITAL_COMMUNITY): Payer: Self-pay

## 2024-02-16 MED ORDER — HYDROCODONE-ACETAMINOPHEN 5-325 MG PO TABS
1.0000 | ORAL_TABLET | Freq: Four times a day (QID) | ORAL | 0 refills | Status: DC | PRN
Start: 2024-02-16 — End: 2024-03-25
  Filled 2024-02-16: qty 15, 4d supply, fill #0

## 2024-02-16 MED ORDER — PROSOURCE PLUS PO LIQD: 30.0000 mL | Freq: Two times a day (BID) | ORAL | Status: DC

## 2024-02-16 MED ORDER — NYSTATIN 100000 UNIT/GM EX POWD
Freq: Three times a day (TID) | CUTANEOUS | 0 refills | Status: DC
  Filled 2024-02-16: qty 15, 7d supply, fill #0

## 2024-02-16 MED ORDER — ADULT MULTIVITAMIN W/MINERALS CH
1.0000 | ORAL_TABLET | Freq: Every day | ORAL | Status: DC
Start: 2024-02-16 — End: 2024-02-20

## 2024-02-16 MED ORDER — QUETIAPINE FUMARATE 50 MG PO TABS
50.0000 mg | ORAL_TABLET | Freq: Every day | ORAL | 0 refills | Status: DC
  Filled 2024-02-16: qty 30, 30d supply, fill #0

## 2024-02-16 MED ORDER — CEFADROXIL 500 MG PO CAPS
1000.0000 mg | ORAL_CAPSULE | Freq: Two times a day (BID) | ORAL | 0 refills | Status: DC
  Filled 2024-02-16: qty 120, 30d supply, fill #0

## 2024-02-16 MED ORDER — ACETAMINOPHEN 325 MG PO TABS: 325.0000 mg | ORAL_TABLET | Freq: Four times a day (QID) | ORAL | Status: DC | PRN

## 2024-02-16 MED ORDER — VITAMIN B-1 100 MG PO TABS: 100.0000 mg | ORAL_TABLET | Freq: Every day | ORAL | Status: DC

## 2024-02-16 MED ORDER — MIDODRINE HCL 10 MG PO TABS
10.0000 mg | ORAL_TABLET | Freq: Two times a day (BID) | ORAL | 0 refills | Status: DC
  Filled 2024-02-16: qty 60, 30d supply, fill #0

## 2024-02-16 MED ORDER — FOLIC ACID 1 MG PO TABS: 1.0000 mg | ORAL_TABLET | Freq: Every day | ORAL | Status: DC

## 2024-02-16 MED ORDER — CEFADROXIL 500 MG PO CAPS
1000.0000 mg | ORAL_CAPSULE | Freq: Two times a day (BID) | ORAL | 0 refills | Status: DC
  Filled 2024-02-16: qty 96, 24d supply, fill #0

## 2024-02-16 NOTE — Discharge Summary (Signed)
 Physician Discharge Summary  Mark Hammond ZOX:[id number] DOB: [birth year] DOA: 01/31/2024  PCP: Patient, No Pcp Per  Admit date: 01/31/2024 Discharge date: 02/16/2024  Admitted From: (Homeless, living in a shelter) Disposition:  (Same)  Recommendations for Outpatient Follow-up:  Follow up with PCP scheduled appointment, and to follow with ID on scheduled appointment 5/16 Please obtain BMP/CBC in one week   Equipment/Devices: Walker and Prafoe boot    Diet recommendation: regular  Brief/Interim Summary:  33 y.o. male with PMH significant for polysubstance abuse including alcohol, tobacco, cocaine , heroin, MDMA, marijuana, bipolar disorder, schizoaffective schizophrenia, 4/30 patient presented to ED with complaint of left foot pain, swelling, warmth. Further workup suggestive of diffuse septic arthritis of the left hip, proximal femur along with left hip and possible pelvic abscess.  This post aspiration, please see discussion below.  Sepsis due to left hip septic arthritis and osteomyelitis, left thigh abscess .   - Started empirically on broad-spectrum antibiotics, patient was seen by orthopedics, PCCM and ID, sepsis pathophysiology has resolved , patient has a left thigh soft tissue infection, knee per Dr. Julio Ohm looks stable, On 5/25 patient underwent ultrasound-guided aspiration left anterior hip interventional radiology. Noted the collection most consistent with abscess at 18 mL fluid was obtained.  Aspirate growing MSSA,  - Antibiotics management per ID, he was on IV cefazolin  during hospital stay, upon discharge he was transition to oral regiment per recommendation, transition to po cefadroxil 1gm bid on discharge to complete 6 weeks from aspiration eot 6/15. ID f/u on 5/21.     HX of polysubstance abuse including ongoing alcohol abuse, ongoing cocaine , amphetamine, cannabis abuse.  Counseled to abstain from all.  On CIWA protocol in hospital stay, no dense of withdrawals on discharge    Hypomagnesemia and hypokalemia.  Replaced.     Anemia of chronic disease.    Mild asymptomatic hypotension, moderate PCM.  Stable TSH and random cortisol, , has been hydrated, placed on protein supplementation and monitor.  Started on low-dose midodrine .       Left lower extremity weakness.  Mostly guarding from pain, seen by neurology, recent L-spine imaging outpatient stable.  Continue supportive care.  PT OT.  Outpatient neuro follow-up.he was provided [facility] boot.   Skin candidiasis - Improved with nystatin powder  Bipolar disorder/schizoaffective disorder-continue with Seroquel    Discharge Diagnoses:  Principal Problem:   Cellulitis of left lower extremity Active Problems:   Polysubstance abuse (HCC)   Severe sepsis (HCC)   Hypokalemia   Bipolar 1 disorder (HCC)   Chronic alcohol abuse   Anemia of chronic disease   Transaminitis    Discharge Instructions  Discharge Instructions     Diet - low sodium heart healthy   Complete by: As directed    Discharge instructions   Complete by: As directed    Follow with Primary MD Patient, and ID on a scheduled appointment printed on your AVS   Diet: regular   On your next visit with your primary care physician please Get Medicines reviewed and adjusted.   Please request your Prim.MD to go over all Hospital Tests and Procedure/Radiological results at the follow up, please get all Hospital records sent to your Prim MD by signing hospital release before you go home.   If you experience worsening of your admission symptoms, develop shortness of breath, life threatening emergency, suicidal or homicidal thoughts you must seek medical attention immediately by calling 911 or calling your MD immediately  if symptoms less severe.  You Must read  complete instructions/literature along with all the possible adverse reactions/side effects for all the Medicines you take and that have been prescribed to you. Take any new Medicines after  you have completely understood and accpet all the possible adverse reactions/side effects.   Do not drive, operating heavy machinery, perform activities at heights, swimming or participation in water  activities or provide baby sitting services if your were admitted for syncope or siezures until you have seen by Primary MD or a Neurologist and advised to do so again.  Do not drive when taking Pain medications.    Do not take more than prescribed Pain, Sleep and Anxiety Medications  Special Instructions: If you have smoked or chewed Tobacco  in the last 2 yrs please stop smoking, stop any regular Alcohol  and or any Recreational drug use.  Wear Seat belts while driving.   Please note  You were cared for by a hospitalist during your hospital stay. If you have any questions about your discharge medications or the care you received while you were in the hospital after you are discharged, you can call the unit and asked to speak with the hospitalist on call if the hospitalist that took care of you is not available. Once you are discharged, your primary care physician will handle any further medical issues. Please note that NO REFILLS for any discharge medications will be authorized once you are discharged, as it is imperative that you return to your primary care physician (or establish a relationship with a primary care physician if you do not have one) for your aftercare needs so that they can reassess your need for medications and monitor your lab values.   Increase activity slowly   Complete by: As directed    No wound care   Complete by: As directed       Allergies as of 02/16/2024       Reactions   Codeine Other (See Comments)   Patient states parents told him he was allergic at a young age.   Fish Allergy Other (See Comments)   Patient does not wish to eat ANY fish- reaction unclear   Shellfish Allergy Other (See Comments)   Patient does not wish to eat ANY shellfish- reaction unclear         Medication List     STOP taking these medications    gabapentin  300 MG capsule Commonly known as: NEURONTIN    Ibuprofen  200 MG Caps   traZODone  50 MG tablet Commonly known as: DESYREL        TAKE these medications    (feeding supplement) PROSource Plus liquid Take 30 mLs by mouth 2 (two) times daily between meals.   acetaminophen  325 MG tablet Commonly known as: TYLENOL  Take 1 tablet (325 mg total) by mouth every 6 (six) hours as needed for mild pain (pain score 1-3) (or Fever >/= 101). What changed:  medication strength how much to take reasons to take this   cefadroxil 500 MG capsule Commonly known as: DURICEF Take 2 capsules (1,000 mg total) by mouth 2 (two) times daily.   ferrous sulfate  325 (65 FE) MG tablet Take 1 tablet (325 mg total) by mouth daily with breakfast.   folic acid  1 MG tablet Commonly known as: FOLVITE  Take 1 tablet (1 mg total) by mouth daily.   HYDROcodone -acetaminophen  5-325 MG tablet Commonly known as: NORCO/VICODIN Take 1 tablet by mouth every 6 (six) hours as needed for up to 5 days for severe pain (pain score 7-10) or moderate  pain (pain score 4-6).   hydrOXYzine  25 MG tablet Commonly known as: ATARAX  Take 25 mg by mouth every 6 (six) hours as needed for anxiety (or sleep).   midodrine  10 MG tablet Commonly known as: PROAMATINE  Take 1 tablet (10 mg total) by mouth 2 (two) times daily with a meal.   multivitamin with minerals Tabs tablet Take 1 tablet by mouth daily.   naloxone  4 MG/0.1ML Liqd nasal spray kit Commonly known as: NARCAN  Place 1 spray into the nose See admin instructions. USE 1 SPRAY INTO ONE NOSTRIL ONCE FOR 1 DOSE, THEN ANOTHER SPRAY MAY BE GIVEN INTO THE OTHER NOSTRIL EVERY 2-3 MINUTES UNTIL THE RECIPIENT RESPONDS   NICODERM CQ  TD Place 1 patch onto the skin daily as needed (for smoking cessation).   nystatin powder Commonly known as: MYCOSTATIN/NYSTOP Apply topically 3 (three) times daily.    QUEtiapine  50 MG tablet Commonly known as: SEROQUEL  Take 1 tablet (50 mg total) by mouth at bedtime. What changed:  medication strength how much to take   thiamine  100 MG tablet Commonly known as: Vitamin B-1 Take 1 tablet (100 mg total) by mouth daily.        Follow-up Information     Marius Siemens, NP Follow up.   Specialty: Internal Medicine Why: TIME : 9:10 AM   PLEASE ARRIVE AT 8:30 AM DATE : Janean Meals  PLEASE BRING ALL MEDICATION , ID and  INS CARD Contact information: [address] [place] [address] [phone]         Orlie Bjornstad, MD Follow up on 02/21/2024.   Specialty: Infectious Diseases Why: 02/21/2024 11:30 AM Orlie Bjornstad, MD [facility] - [facility] Dept Of [facility] Contact information: [address] [place], [address] [place] [address] [phone]                Allergies  Allergen Reactions   Codeine Other (See Comments)    Patient states parents told him he was allergic at a young age.   Fish Allergy Other (See Comments)    Patient does not wish to eat ANY fish- reaction unclear   Shellfish Allergy Other (See Comments)    Patient does not wish to eat ANY shellfish- reaction unclear    Consultations: ID, PCCM, orthopedic   Procedures/Studies: CT FEMUR LEFT W CONTRAST Result Date: 02/08/2024 CLINICAL DATA:  Soft tissue infection suspected, thigh, no prior imaging EXAM: CT OF THE LOWER LEFT EXTREMITY WITH CONTRAST TECHNIQUE: Multidetector CT imaging of the lower left extremity was performed according to the standard protocol following intravenous contrast administration. RADIATION DOSE REDUCTION: This exam was performed according to the departmental dose-optimization program which includes automated exposure control, adjustment of the mA and/or kV according to patient size and/or use of iterative reconstruction technique. CONTRAST:  75mL OMNIPAQUE  IOHEXOL  350 MG/ML SOLN  COMPARISON:  Prior MRI 02/03/2024 and CT 02/04/2024. FINDINGS: Bones/Joint/Cartilage Again noted are changes of septic arthritis and osteomyelitis within the left hip, acetabulum, and proximal left femur, unchanged since prior MRI and CT. Ligaments Suboptimally assessed by CT. Muscles and Tendons Decreasing size of the intramuscular fluid collection in the upper left rectus femoris muscle now measuring 2.1 x 1.0 cm compared to 4.1 x 2.7 cm on prior CT. No visible extension into the pelvis. Soft tissues As above IMPRESSION: Continued changes of osteomyelitis and septic arthritis surrounded the left hip and involving the acetabula and proximal left femur. Intramuscular abscess in the upper rectus  femorals muscle has decreased in size, now 2.1 x 1.0 cm compared to 4.1 x 2.7 cm previously. Electronically Signed   By: Janeece Mechanic M.D.   On: 02/08/2024 18:42   DG Chest Port 1 View Result Date: 02/08/2024 CLINICAL DATA:  Shortness of breath. EXAM: PORTABLE CHEST 1 VIEW COMPARISON:  AP chest 02/04/2024, CT chest, abdomen, and pelvis 05/28/2023 FINDINGS: Cardiac silhouette and mediastinal contours are within limits. The lungs are clear. No pleural effusion pneumothorax. Minimal dextrocurvature of the upper thoracic spine, unchanged from prior. IMPRESSION: No acute cardiopulmonary disease process. Electronically Signed   By: Bertina Broccoli M.D.   On: 02/08/2024 14:25   ECHOCARDIOGRAM COMPLETE Result Date: 02/05/2024    ECHOCARDIOGRAM REPORT   Patient Name:   Tydarrius Gammell Date of Exam: 02/05/2024 Medical Rec #:  [id number]   Height:       63.0 in Accession #:    [id number]  Weight:       130.5 lb Date of Birth:  01/04/1991   BSA:          1.613 m Patient Age:    32 years    BP:           108/58 mmHg Patient Gender: M           HR:           84 bpm. Exam Location:  Inpatient Procedure: 2D Echo, Color Doppler and Cardiac Doppler (Both Spectral and Color            Flow Doppler were utilized during procedure). Indications:     Endocarditis  History:        Patient has no prior history of Echocardiogram examinations.  Sonographer:    Andrena Bang Referring Phys: Hadley Leu  Sonographer Comments: Pt laying on right side due to infection in left leg and bottom. IMPRESSIONS  1. Left ventricular ejection fraction, by estimation, is 60 to 65%. The left ventricle has normal function. The left ventricle has no regional wall motion abnormalities. Left ventricular diastolic parameters were normal.  2. Right ventricular systolic function is normal. The right ventricular size is normal. Tricuspid regurgitation signal is inadequate for assessing PA pressure.  3. The mitral valve is grossly normal. No evidence of mitral valve regurgitation. No evidence of mitral stenosis.  4. The aortic valve was not well visualized. There is mild calcification of the aortic valve. There is mild thickening of the aortic valve. Aortic valve regurgitation is not visualized. No aortic stenosis is present.  5. The inferior vena cava is normal in size with greater than 50% respiratory variability, suggesting right atrial pressure of 3 mmHg. Comparison(s): No prior Echocardiogram. Conclusion(s)/Recommendation(s): No evidence of valvular vegetations on this transthoracic echocardiogram. Consider a transesophageal echocardiogram to exclude infective endocarditis if clinically indicated. FINDINGS  Left Ventricle: Left ventricular ejection fraction, by estimation, is 60 to 65%. The left ventricle has normal function. The left ventricle has no regional wall motion abnormalities. The left ventricular internal cavity size was normal in size. There is  no left ventricular hypertrophy. Left ventricular diastolic parameters were normal. Right Ventricle: The right ventricular size is normal. No increase in right ventricular wall thickness. Right ventricular systolic function is normal. Tricuspid regurgitation signal is inadequate for assessing PA pressure. Left Atrium: Left atrial  size was normal in size. Right Atrium: Right atrial size was normal in size. Pericardium: There is no evidence of pericardial effusion. Mitral Valve: The mitral valve is grossly normal. No evidence of mitral  valve regurgitation. No evidence of mitral valve stenosis. Tricuspid Valve: The tricuspid valve is normal in structure. Tricuspid valve regurgitation is not demonstrated. No evidence of tricuspid stenosis. Aortic Valve: The aortic valve was not well visualized. There is mild calcification of the aortic valve. There is mild thickening of the aortic valve. Aortic valve regurgitation is not visualized. No aortic stenosis is present. Aortic valve mean gradient  measures 3.0 mmHg. Aortic valve peak gradient measures 8.3 mmHg. Aortic valve area, by VTI measures 3.62 cm. Pulmonic Valve: The pulmonic valve was not well visualized. Pulmonic valve regurgitation is not visualized. No evidence of pulmonic stenosis. Aorta: The aortic root and ascending aorta are structurally normal, with no evidence of dilitation. Venous: The inferior vena cava is normal in size with greater than 50% respiratory variability, suggesting right atrial pressure of 3 mmHg. IAS/Shunts: The atrial septum is grossly normal.  LEFT VENTRICLE PLAX 2D LVIDd:         4.50 cm     Diastology LVIDs:         3.60 cm     LV e' medial:    11.00 cm/s LV PW:         0.90 cm     LV E/e' medial:  8.6 LV IVS:        1.10 cm     LV e' lateral:   15.90 cm/s LVOT diam:     2.10 cm     LV E/e' lateral: 6.0 LV SV:         81 LV SV Index:   50 LVOT Area:     3.46 cm  LV Volumes (MOD) LV vol d, MOD A2C: 68.4 ml LV vol d, MOD A4C: 83.1 ml LV vol s, MOD A2C: 17.9 ml LV vol s, MOD A4C: 36.6 ml LV SV MOD A2C:     50.5 ml LV SV MOD A4C:     83.1 ml LV SV MOD BP:      51.5 ml RIGHT VENTRICLE RV S prime:     8.92 cm/s TAPSE (M-mode): 1.6 cm LEFT ATRIUM             Index LA Vol (A2C):   15.7 ml 9.73 ml/m LA Vol (A4C):   26.4 ml 16.37 ml/m LA Biplane Vol: 21.3 ml 13.21 ml/m   AORTIC VALVE AV Area (Vmax):    3.42 cm AV Area (Vmean):   3.76 cm AV Area (VTI):     3.62 cm AV Vmax:           144.00 cm/s AV Vmean:          77.100 cm/s AV VTI:            0.225 m AV Peak Grad:      8.3 mmHg AV Mean Grad:      3.0 mmHg LVOT Vmax:         142.00 cm/s LVOT Vmean:        83.800 cm/s LVOT VTI:          0.235 m LVOT/AV VTI ratio: 1.04  AORTA Ao Asc diam: 2.70 cm MITRAL VALVE MV Area (PHT): 4.21 cm    SHUNTS MV Decel Time: 180 msec    Systemic VTI:  0.24 m MV E velocity: 94.90 cm/s  Systemic Diam: 2.10 cm MV A velocity: 71.80 cm/s MV E/A ratio:  1.32 Sheryle Donning MD Electronically signed by Sheryle Donning MD Signature Date/Time: 02/05/2024/5:20:12 PM    Final    US  FINE  NEEDLE ASP 1ST LESION Result Date: 02/05/2024 INDICATION: Showed arthritis left hip with MRI demonstrating a focal fluid collection in the anterior thigh EXAM: Ultrasound-guided fluid aspiration MEDICATIONS: None. ANESTHESIA/SEDATION: 7 mL of 1% lidocaine  COMPLICATIONS: None immediate. PROCEDURE: Informed written consent was obtained from the patient after a thorough discussion of the procedural risks, benefits and alternatives. All questions were addressed. Maximal Sterile Barrier Technique was utilized including caps, mask, sterile gowns, sterile gloves, sterile drape, hand hygiene and skin antiseptic. A timeout was performed prior to the initiation of the procedure. The proximal left anterior thigh was evaluated with ultrasound demonstrating the irregular fluid collection most consistent with soft tissue abscess. Ultrasound images were obtained the skin was marked. Skin was then prepped and draped in usual sterile fashion. Local lidocaine  was then infiltrated into the access site. A small incision was made and then a Yueh needle 7 cm in length was then advanced from the skin to the abscess pocket under ultrasound guidance. Approximately 18 mL of thick fluid was obtained and sent to pathology for culture. The  abscess pocket was completely collapsed with no significant residual fluid remaining. The needle was removed. Sterile dressing was applied. IMPRESSION: Satisfactory ultrasound aspiration of a left anterior thigh fluid collection most consistent with abscess. Cultures obtained. Electronically Signed   By: Susan Ensign   On: 02/05/2024 10:09   DG Chest Port 1 View Result Date: 02/04/2024 CLINICAL DATA:  Shortness of breath. EXAM: PORTABLE CHEST 1 VIEW COMPARISON:  Chest radiograph dated 02/01/2024. FINDINGS: No focal consolidation, pleural effusion or pneumothorax. The cardiac silhouette is within normal limits. No acute osseous pathology. IMPRESSION: No active disease. Electronically Signed   By: Angus Bark M.D.   On: 02/04/2024 13:40   CT PELVIS W CONTRAST Result Date: 02/04/2024 CLINICAL DATA:  Leg swelling and pain. EXAM: CT PELVIS WITH CONTRAST TECHNIQUE: Multidetector CT imaging of the pelvis was performed using the standard protocol following the bolus administration of intravenous contrast. RADIATION DOSE REDUCTION: This exam was performed according to the departmental dose-optimization program which includes automated exposure control, adjustment of the mA and/or kV according to patient size and/or use of iterative reconstruction technique. CONTRAST:  75mL OMNIPAQUE  IOHEXOL  350 MG/ML SOLN COMPARISON:  05/28/2023. FINDINGS: Urinary Tract:  No abnormality visualized. Bowel: Unremarkable visualized pelvic bowel loops. Normal appendix. Vascular/Lymphatic: No pathologically enlarged lymph nodes. No significant vascular abnormality seen. Reproductive:  Unremarkable prostate. Other: Left proximal thigh diffuse subcutaneous edema and skin thickening consistent with cellulitis. No subcutaneous air to indicate necrotizing fasciitis and no evidence of Ludwig's angina. Musculoskeletal: Intramuscular rim enhancing fluid collection consistent with a 4 cm abscess in the proximal left thigh. IMPRESSION: Left  proximal thigh cellulitis with an intramuscular abscess. Electronically Signed   By: Sydell Eva M.D.   On: 02/04/2024 10:14   MR FEMUR LEFT W WO CONTRAST Result Date: 02/03/2024 CLINICAL DATA:  Acute limping.  Thigh pain.  Infection suspected. EXAM: MR OF THE LEFT LOWER EXTREMITY WITHOUT AND WITH CONTRAST TECHNIQUE: Multiplanar, multisequence MR imaging of the left thigh was performed both before and after administration of intravenous contrast. CONTRAST:  5mL GADAVIST  GADOBUTROL  1 MMOL/ML IV SOLN COMPARISON:  None recent. Left femur radiographs 11/27/2023. Pelvic CT 02/11/2022. FINDINGS: Bones/Joint/Cartilage The proximal left femur and left acetabulum are grossly abnormal and appeared normal on the radiographs performed 11/27/2023. There is diffuse replacement of the normal T1 marrow signal within the acetabulum, left femoral head and neck with cortical destruction and superior displacement of the femoral head. There  is associated diffusely increased T2 marrow signal and enhancement following contrast. Marrow changes extend into the proximal left femoral diaphysis. There is a moderate size irregular hip joint effusion with synovial enhancement following contrast. Findings are highly suspicious for septic arthritis and osteomyelitis. The distal left femur appears unremarkable. The right hip and visualized right femur appear normal. The symphysis pubis is intact. The sacroiliac joints are incompletely visualized. Ligaments No significant ligamentous abnormalities identified. Muscles and Tendons Diffuse edema and enhancement throughout the proximal thigh musculature, including the iliacus and obturator internus muscles. There are multiple irregular peripherally enhancing fluid collections anteriorly in the proximal left thigh, largest superficial and lateral to the proximal rectus femoris muscle, measuring 3.3 x 2.3 x 4.1 cm. There are other smaller fluid collections anterior to the left hip and within the  gluteus medius and minimus muscles. Edema extends into the mid to distal left thigh musculature without associated fluid collection distally. The quadriceps tendon is intact. Soft tissues Asymmetric subcutaneous edema within the visualized left pelvis and left thigh. Mild subcutaneous enhancement lateral to the left thigh. Incomplete visualization of the proximal extent of inflammation into the pelvis along the iliopsoas and obturator internus muscles. IMPRESSION: 1. Findings are highly suspicious for septic arthritis and osteomyelitis of the left hip and proximal left femur. The proximal extent of the pelvic involvement is not imaged. The sacroiliac joints are not included. 2. Diffuse edema and enhancement throughout the proximal left thigh musculature with multiple irregular peripherally enhancing fluid collections anteriorly in the proximal left thigh, largest superficial and lateral to the proximal rectus femoris muscle, suspicious for abscesses. 3. Incomplete visualization of the proximal extension of inflammation into the pelvis along the iliopsoas and obturator internus muscles. 4. Asymmetric subcutaneous edema within the visualized left pelvis and left thigh. 5. Recommend further evaluation of the pelvis (with CT or MRI) to better evaluate the proximal extent of the infection. 6. These results will be called to the ordering clinician or representative by the Radiologist Assistant, and communication documented in the PACS or [employer]. Electronically Signed   By: Elmon Hagedorn M.D.   On: 02/03/2024 13:17   DG Skull 1-3 Views Result Date: 02/01/2024 CLINICAL DATA:  Encounter for imaging to screen for metal prior to MRI EXAM: SKULL - 1-3 VIEW COMPARISON:  None Available. FINDINGS: There is no evidence of skull fracture or other focal bone lesions. No definite metallic foreign body is noted. IMPRESSION: Negative. Electronically Signed   By: Rosalene Colon M.D.   On: 02/01/2024 12:33   DG Abd 1  View Result Date: 02/01/2024 CLINICAL DATA:  Pre MRI screen EXAM: ABDOMEN - 1 VIEW COMPARISON:  None Available. FINDINGS: Decubitus views of the abdomen and pelvis to the pubic symphysis demonstrate no radiodense foreign bodies. IMPRESSION: No radiodense foreign body identified in the imaged areas. Electronically Signed   By: Deboraha Fallow M.D.   On: 02/01/2024 12:28   DG Chest Port 1 View Result Date: 02/01/2024 EXAM: 1 VIEW XRAY OF THE CHEST 02/01/2024 12:55:50 AM COMPARISON: 05/28/2023 CLINICAL HISTORY: [id number] Sepsis (HCC) [id number]. Per chart: Reason for exam: acute left foot pain and cellulitis ; Best obtainable due to patient condition, pt unable to roll on back as well as unable to move legs. Pt was not awake during exam after multiple attempts to wake patient up. Acute left foot pain and cellulitis. FINDINGS: LUNGS AND PLEURA: Mild patchy right lower lobe opacity suspicious for pneumonia. No pulmonary edema. No pleural effusion. No pneumothorax. HEART  AND MEDIASTINUM: No acute abnormality of the cardiac and mediastinal silhouettes. BONES AND SOFT TISSUES: No acute osseous abnormality. IMPRESSION: 1. Mild patchy right lower lobe opacity, suspicious for pneumonia. Electronically signed by: Zadie Herter MD 02/01/2024 01:02 AM EDT RP Workstation: [id number]   DG Foot 2 Views Left Result Date: 02/01/2024 EXAM: 2 VIEW(S) XRAY OF THE LEFT FOOT 02/01/2024 12:53:06 AM COMPARISON: None available. CLINICAL HISTORY: Acute left foot pain and cellulitis. Best obtainable due to patient condition, pt unable to roll on back as well as move legs. Pt was not awake during exam after multiple attempts to wake patient up. FINDINGS: BONES AND JOINTS: No acute fracture or focal osseous lesion. No joint dislocation. SOFT TISSUES: Mild dorsal soft tissue swelling. IMPRESSION: 1. No fracture or dislocation. Electronically signed by: Zadie Herter MD 02/01/2024 01:01 AM EDT RP Workstation: [id number]   DG Chest 1  View Result Date: 01/27/2024 CLINICAL DATA:  Suicidal ideation EXAM: CHEST  1 VIEW COMPARISON:  03/05/2023 FINDINGS: Lungs are clear.  No pleural effusion or pneumothorax. The heart is normal in size. IMPRESSION: No acute cardiopulmonary disease. Electronically Signed   By: Zadie Herter M.D.   On: 01/27/2024 00:13   DG Foot 2 Views Right Result Date: 01/26/2024 CLINICAL DATA:  Right foot pain EXAM: RIGHT FOOT - 2 VIEW COMPARISON:  None Available. FINDINGS: There is no evidence of fracture or dislocation. There is no evidence of arthropathy or other focal bone abnormality. Soft tissues are unremarkable. IMPRESSION: Negative. Electronically Signed   By: Worthy Heads M.D.   On: 01/26/2024 21:08      Subjective: No significant events overnight as discussed with staff, he denies any complaints today  Discharge Exam: Vitals:   02/16/24 0327 02/16/24 0800  BP: (!) 97/47 (!) 92/47  Pulse:  75  Resp: 18 18  Temp: 98.3 F (36.8 C) 97.6 F (36.4 C)  SpO2: 100% 100%   Vitals:   02/15/24 2327 02/16/24 0327 02/16/24 0409 02/16/24 0800  BP: (!) 103/56 (!) 97/47  (!) 92/47  Pulse:    75  Resp: 18 18  18   Temp: 98 F (36.7 C) 98.3 F (36.8 C)  97.6 F (36.4 C)  TempSrc: Oral Oral  Oral  SpO2: 100% 100%  100%  Weight:   56.3 kg   Height:        General: Pt is alert, awake, not in acute distress Cardiovascular: RRR, S1/S2 +, no rubs, no gallops Respiratory: CTA bilaterally, no wheezing, no rhonchi Abdominal: Soft, NT, ND, bowel sounds + Extremities: no edema, no cyanosis, chronic left lower extremity weakness    The results of significant diagnostics from this hospitalization (including imaging, microbiology, ancillary and laboratory) are listed below for reference.     Microbiology: Recent Results (from the past 240 hours)  Culture, blood (Routine X 2) w Reflex to ID Panel     Status: None   Collection Time: 02/08/24  3:38 PM   Specimen: BLOOD RIGHT HAND  Result Value Ref  Range Status   Specimen Description BLOOD RIGHT HAND  Final   Special Requests   Final    BOTTLES DRAWN AEROBIC AND ANAEROBIC Blood Culture adequate volume   Culture   Final    NO GROWTH 5 DAYS Performed at [facility], [address], [place], [place] [address]    Report Status 02/13/2024 FINAL  Final  Culture, blood (Routine X 2) w Reflex to ID Panel     Status: None   Collection Time:  02/08/24  3:39 PM   Specimen: BLOOD RIGHT ARM  Result Value Ref Range Status   Specimen Description BLOOD RIGHT ARM  Final   Special Requests   Final    BOTTLES DRAWN AEROBIC AND ANAEROBIC Blood Culture adequate volume   Culture   Final    NO GROWTH 5 DAYS Performed at [facility], [address] [place], [place] [address]    Report Status 02/13/2024 FINAL  Final     Labs: BNP (last 3 results) No results for input(s): "BNP" in the last 8760 hours. Basic Metabolic Panel: Recent Labs  Lab 02/12/24 0801 02/12/24 0826 02/13/24 0436  NA  --  139 136  K  --  3.3* 4.1  CL  --  100 103  CO2  --  25 24  GLUCOSE  --  115* 94  BUN  --  8 10  CREATININE  --  0.64 0.55*  CALCIUM  --  9.1 9.8  MG 1.5*  --  2.0  PHOS 3.8  --   --    Liver Function Tests: Recent Labs  Lab 02/12/24 0826  AST 84*  ALT 40  ALKPHOS 111  BILITOT 0.4  PROT 7.5  ALBUMIN 3.7   No results for input(s): "LIPASE", "AMYLASE" in the last 168 hours. No results for input(s): "AMMONIA" in the last 168 hours. CBC: Recent Labs  Lab 02/12/24 0801  WBC 5.2  NEUTROABS 2.5  HGB 10.0*  HCT 32.1*  MCV 92.2  PLT 375   Cardiac Enzymes: No results for input(s): "CKTOTAL", "CKMB", "CKMBINDEX", "TROPONINI" in the last 168 hours. BNP: Invalid input(s): "POCBNP" CBG: No results for input(s): "GLUCAP" in the last 168 hours. D-Dimer No results for input(s): "DDIMER" in the last 72 hours. Hgb A1c No results for input(s): "HGBA1C" in the last 72 hours. Lipid Profile No results for input(s): "CHOL", "HDL",  "LDLCALC", "TRIG", "CHOLHDL", "LDLDIRECT" in the last 72 hours. Thyroid  function studies No results for input(s): "TSH", "T4TOTAL", "T3FREE", "THYROIDAB" in the last 72 hours.  Invalid input(s): "FREET3" Anemia work up No results for input(s): "VITAMINB12", "FOLATE", "FERRITIN", "TIBC", "IRON", "RETICCTPCT" in the last 72 hours. Urinalysis    Component Value Date/Time   COLORURINE STRAW (A) 02/09/2024 1658   APPEARANCEUR CLEAR 02/09/2024 1658   LABSPEC 1.008 02/09/2024 1658   PHURINE 6.0 02/09/2024 1658   GLUCOSEU NEGATIVE 02/09/2024 1658   HGBUR NEGATIVE 02/09/2024 1658   BILIRUBINUR NEGATIVE 02/09/2024 1658   BILIRUBINUR negative 09/05/2019 1004   KETONESUR NEGATIVE 02/09/2024 1658   PROTEINUR NEGATIVE 02/09/2024 1658   UROBILINOGEN 0.2 09/05/2019 1004   NITRITE NEGATIVE 02/09/2024 1658   LEUKOCYTESUR NEGATIVE 02/09/2024 1658   Sepsis Labs Recent Labs  Lab 02/12/24 0801  WBC 5.2   Microbiology Recent Results (from the past 240 hours)  Culture, blood (Routine X 2) w Reflex to ID Panel     Status: None   Collection Time: 02/08/24  3:38 PM   Specimen: BLOOD RIGHT HAND  Result Value Ref Range Status   Specimen Description BLOOD RIGHT HAND  Final   Special Requests   Final    BOTTLES DRAWN AEROBIC AND ANAEROBIC Blood Culture adequate volume   Culture   Final    NO GROWTH 5 DAYS Performed at [facility], [address] [place], [place] [address]    Report Status 02/13/2024 FINAL  Final  Culture, blood (Routine X 2) w Reflex to ID Panel     Status: None   Collection  Time: 02/08/24  3:39 PM   Specimen: BLOOD RIGHT ARM  Result Value Ref Range Status   Specimen Description BLOOD RIGHT ARM  Final   Special Requests   Final    BOTTLES DRAWN AEROBIC AND ANAEROBIC Blood Culture adequate volume   Culture   Final    NO GROWTH 5 DAYS Performed at [facility], [address], [place], [place] [address]    Report Status 02/13/2024 FINAL  Final     Time  coordinating discharge: Over 30 minutes  SIGNED:   Seena Dadds, MD  [facility] 02/16/2024, 11:16 AM Pager   If 7PM-7AM, please contact night-coverage [email or web address] Password [email or web address]

## 2024-02-16 NOTE — TOC Transition Note (Signed)
 Transition of Care [facility]) - Discharge Note   Patient Details  Name: Mark Hammond MRN: [id number] Date of Birth: [birth year]  Transition [facility] Care [facility]) CM/SW Contact:  Eusebio High, RN Phone Number: 02/16/2024, 9:32 AM   Clinical Narrative:     Patient will DC to home. Rolling walker provided Bus Pass needed and RNCM will provide. Patient will follow up as directed on AVS       Barriers to Discharge: Continued Medical Work up, Homeless with medical needs   Patient Goals and CMS Choice            Discharge Placement                       Discharge Plan and Services Additional resources added to the After Visit Summary for                                       Social Drivers of Health (SDOH) Interventions SDOH Screenings   Food Insecurity: Patient Unable To Answer (02/02/2024)  Recent Concern: Food Insecurity - Food Insecurity Present (12/06/2023)  Housing: Patient Unable To Answer (02/02/2024)  Transportation Needs: Patient Unable To Answer (02/02/2024)  Recent Concern: Transportation Needs - Unmet Transportation Needs (01/21/2024)  Utilities: Patient Unable To Answer (02/02/2024)  Alcohol Screen: High Risk (02/23/2023)  Depression (PHQ2-9): Low Risk  (01/17/2024)  Recent Concern: Depression (PHQ2-9) - High Risk (01/14/2024)  Tobacco Use: High Risk (01/31/2024)     Readmission Risk Interventions    02/02/2024   11:06 AM  Readmission Risk Prevention Plan  Transportation Screening Complete  Medication Review (RN Care Manager) Complete  PCP or Specialist appointment within 3-5 days of discharge Complete  HRI or Home Care Consult Complete  SW Recovery Care/Counseling Consult Complete  Palliative Care Screening Not Applicable  Skilled Nursing Facility Not Applicable

## 2024-02-16 NOTE — Progress Notes (Addendum)
 Mark Hammond to be D/C'd home per MD order. Instructions provided by Raiford Bunnell, RN. Declined pneumonia vaccine. Skin clean and dry. TOC medications picked up form pharmacy and delivered to patient. Walker delivered and sent home with patient. IV catheter discontinued intact. Site without signs and symptoms of complications. Dressing and pressure applied.  An After Visit Summary was printed and given to the patient.  Patient escorted via [facility], and D/C'ed to discharge lounge. Bus pass provided. [name]  02/16/2024

## 2024-02-19 ENCOUNTER — Other Ambulatory Visit: Payer: Self-pay

## 2024-02-19 ENCOUNTER — Inpatient Hospital Stay (HOSPITAL_COMMUNITY)
Admission: EM | Admit: 2024-02-19 | Discharge: 2024-03-25 | DRG: 469 | Disposition: A | Discharge status: Home / Self Care | Payer: MEDICAID | Attending: Internal Medicine | Admitting: Internal Medicine

## 2024-02-19 ENCOUNTER — Emergency Department (HOSPITAL_COMMUNITY)
Admission: EM | Admit: 2024-02-19 | Discharge: 2024-02-19 | Disposition: A | Discharge status: Home / Self Care | Payer: MEDICAID | Source: Home / Self Care | Attending: Emergency Medicine | Admitting: Emergency Medicine

## 2024-02-19 ENCOUNTER — Emergency Department (HOSPITAL_COMMUNITY): Payer: MEDICAID

## 2024-02-19 DIAGNOSIS — Z792 Long term (current) use of antibiotics: Secondary | ICD-10-CM

## 2024-02-19 DIAGNOSIS — M869 Osteomyelitis, unspecified: Secondary | ICD-10-CM

## 2024-02-19 DIAGNOSIS — F1410 Cocaine abuse, uncomplicated: Secondary | ICD-10-CM | POA: Diagnosis present

## 2024-02-19 DIAGNOSIS — F259 Schizoaffective disorder, unspecified: Secondary | ICD-10-CM | POA: Diagnosis present

## 2024-02-19 DIAGNOSIS — L299 Pruritus, unspecified: Secondary | ICD-10-CM | POA: Diagnosis present

## 2024-02-19 DIAGNOSIS — Z79899 Other long term (current) drug therapy: Secondary | ICD-10-CM

## 2024-02-19 DIAGNOSIS — Z885 Allergy status to narcotic agent status: Secondary | ICD-10-CM

## 2024-02-19 DIAGNOSIS — Z823 Family history of stroke: Secondary | ICD-10-CM

## 2024-02-19 DIAGNOSIS — J9601 Acute respiratory failure with hypoxia: Secondary | ICD-10-CM | POA: Diagnosis present

## 2024-02-19 DIAGNOSIS — F17290 Nicotine dependence, other tobacco product, uncomplicated: Secondary | ICD-10-CM | POA: Diagnosis present

## 2024-02-19 DIAGNOSIS — R7401 Elevation of levels of liver transaminase levels: Secondary | ICD-10-CM | POA: Diagnosis present

## 2024-02-19 DIAGNOSIS — F17210 Nicotine dependence, cigarettes, uncomplicated: Secondary | ICD-10-CM | POA: Diagnosis present

## 2024-02-19 DIAGNOSIS — L02416 Cutaneous abscess of left lower limb: Secondary | ICD-10-CM | POA: Diagnosis present

## 2024-02-19 DIAGNOSIS — T401X1A Poisoning by heroin, accidental (unintentional), initial encounter: Secondary | ICD-10-CM | POA: Diagnosis present

## 2024-02-19 DIAGNOSIS — L03116 Cellulitis of left lower limb: Principal | ICD-10-CM | POA: Diagnosis present

## 2024-02-19 DIAGNOSIS — M86152 Other acute osteomyelitis, left femur: Principal | ICD-10-CM | POA: Diagnosis present

## 2024-02-19 DIAGNOSIS — Z5900 Homelessness unspecified: Secondary | ICD-10-CM

## 2024-02-19 DIAGNOSIS — F1510 Other stimulant abuse, uncomplicated: Secondary | ICD-10-CM | POA: Diagnosis present

## 2024-02-19 DIAGNOSIS — L0390 Cellulitis, unspecified: Secondary | ICD-10-CM | POA: Diagnosis present

## 2024-02-19 DIAGNOSIS — Z5941 Food insecurity: Secondary | ICD-10-CM

## 2024-02-19 DIAGNOSIS — K2920 Alcoholic gastritis without bleeding: Secondary | ICD-10-CM | POA: Diagnosis present

## 2024-02-19 DIAGNOSIS — E872 Acidosis, unspecified: Secondary | ICD-10-CM | POA: Diagnosis present

## 2024-02-19 DIAGNOSIS — T402X1A Poisoning by other opioids, accidental (unintentional), initial encounter: Secondary | ICD-10-CM | POA: Diagnosis present

## 2024-02-19 DIAGNOSIS — B182 Chronic viral hepatitis C: Secondary | ICD-10-CM | POA: Diagnosis present

## 2024-02-19 DIAGNOSIS — E876 Hypokalemia: Secondary | ICD-10-CM | POA: Diagnosis present

## 2024-02-19 DIAGNOSIS — F1210 Cannabis abuse, uncomplicated: Secondary | ICD-10-CM | POA: Diagnosis present

## 2024-02-19 DIAGNOSIS — F1010 Alcohol abuse, uncomplicated: Secondary | ICD-10-CM | POA: Diagnosis present

## 2024-02-19 DIAGNOSIS — T50901A Poisoning by unspecified drugs, medicaments and biological substances, accidental (unintentional), initial encounter: Secondary | ICD-10-CM | POA: Insufficient documentation

## 2024-02-19 DIAGNOSIS — F319 Bipolar disorder, unspecified: Secondary | ICD-10-CM | POA: Diagnosis present

## 2024-02-19 DIAGNOSIS — F1120 Opioid dependence, uncomplicated: Secondary | ICD-10-CM | POA: Diagnosis present

## 2024-02-19 DIAGNOSIS — M00052 Staphylococcal arthritis, left hip: Secondary | ICD-10-CM | POA: Diagnosis present

## 2024-02-19 DIAGNOSIS — D638 Anemia in other chronic diseases classified elsewhere: Secondary | ICD-10-CM | POA: Diagnosis present

## 2024-02-19 DIAGNOSIS — M25572 Pain in left ankle and joints of left foot: Secondary | ICD-10-CM | POA: Diagnosis present

## 2024-02-19 DIAGNOSIS — M8668 Other chronic osteomyelitis, other site: Secondary | ICD-10-CM | POA: Diagnosis present

## 2024-02-19 DIAGNOSIS — B9561 Methicillin susceptible Staphylococcus aureus infection as the cause of diseases classified elsewhere: Secondary | ICD-10-CM | POA: Diagnosis present

## 2024-02-19 DIAGNOSIS — Z91013 Allergy to seafood: Secondary | ICD-10-CM

## 2024-02-19 DIAGNOSIS — F1920 Other psychoactive substance dependence, uncomplicated: Secondary | ICD-10-CM | POA: Diagnosis present

## 2024-02-19 DIAGNOSIS — Z96642 Presence of left artificial hip joint: Secondary | ICD-10-CM | POA: Diagnosis present

## 2024-02-19 DIAGNOSIS — Z5982 Transportation insecurity: Secondary | ICD-10-CM

## 2024-02-19 HISTORY — DX: Essential (primary) hypertension: I10

## 2024-02-19 HISTORY — DX: Other psychoactive substance abuse, uncomplicated: F19.10

## 2024-02-19 LAB — URINALYSIS, W/ REFLEX TO CULTURE (INFECTION SUSPECTED)
Bilirubin Urine: NEGATIVE
Glucose, UA: NEGATIVE mg/dL
Hgb urine dipstick: NEGATIVE
Ketones, ur: NEGATIVE mg/dL
Leukocytes,Ua: NEGATIVE
Nitrite: NEGATIVE
Protein, ur: 30 mg/dL — AB
Specific Gravity, Urine: 1.009 (ref 1.005–1.030)
pH: 5 (ref 5.0–8.0)

## 2024-02-19 LAB — CBG MONITORING, ED: Glucose-Capillary: 114 mg/dL — ABNORMAL HIGH (ref 70–99)

## 2024-02-19 MED ORDER — NALOXONE HCL 4 MG/0.1ML NA LIQD: NASAL | 0 refills | Status: DC

## 2024-02-19 MED ORDER — LACTATED RINGERS IV BOLUS
1000.0000 mL | Freq: Once | INTRAVENOUS | Status: AC
  Administered 2024-02-19: 1000 mL via INTRAVENOUS

## 2024-02-19 MED ORDER — ONDANSETRON 4 MG PO TBDP
4.0000 mg | ORAL_TABLET | Freq: Once | ORAL | Status: AC
  Administered 2024-02-19: 4 mg via ORAL
  Filled 2024-02-19: qty 1

## 2024-02-19 NOTE — ED Triage Notes (Signed)
 Pt bibgcems found unresponsive with pinpoint pupils. Fire gave 4mg  of narcan , ems bagged for 10 minutes. Pt admits to meth and trying heroin today.  100 hr 132 cbg Bp 130/90 100% ra

## 2024-02-19 NOTE — Discharge Instructions (Signed)
 Mark Hammond:  Thank you for allowing us  to take care of you today.  We hope you begin feeling better soon. You were seen today for drug overdose.   To-Do:  Please follow-up with your primary doctor within the next 2-3 days. It is important that you review any labs or imaging results (if any) that you had today with them. Your preliminary imaging results (if any) are attached. Please return to the Emergency Department or call 911 if you experience chest pain, shortness of breath, severe pain, severe fever, altered mental status, or have any reason to think that you need emergency medical care.  Thank you again.  Hope you feel better soon.  Arminda Landmark, MD Department of Emergency Medicine

## 2024-02-19 NOTE — ED Provider Notes (Signed)
 [place] EMERGENC[facility] DEPARTMENT AT [facility] Provider Note  History  Chief Complaint:  Drug Overdose  The history is provided by the patient.  Drug Overdose    Mark Hammond is a 33 y.o. male with a history of bipolar 1, drug use, ethanol use who presents emergency department after apparent drug overdose.  EMS found the patient unresponsive with pinpoint pupils.  Patient was given 4 mg of Narcan  by fire.  EMS did have to perform bag valve ventilation for approximately 10 minutes.  He reports to using ICE and heroin today.   Past Medical History:  Diagnosis Date   Alcoholic gastritis    Bipolar 1 disorder (HCC)    Dental abscess    Depression    Drug abuse (HCC)    ETOH abuse    Hallucination    Schizo affective schizophrenia (HCC)     No past surgical history on file.  Family History  Problem Relation Age of Onset   Stroke Other     Social History   Tobacco Use   Smoking status: Every Day    Current packs/day: 1.00    Types: Cigarettes    Passive exposure: Current   Smokeless tobacco: Never  Vaping Use   Vaping status: Every Day   Substances: THC  Substance Use Topics   Alcohol use: Yes    Comment: 4-5x 40oz beers daily   Drug use: Yes    Frequency: 7.0 times per week    Types: "Crack" cocaine , Heroin, MDMA (Ecstacy), Marijuana, Cocaine     Comment: 01/22/24    Review of Systems  Review of Systems   Reviewed and documented in HPI if pertinent.   Physical Exam   ED Triage Vitals  Encounter Vitals Group     BP 02/19/24 1913 129/87     Systolic BP Percentile --      Diastolic BP Percentile --      Pulse Rate 02/19/24 1913 (!) 119     Resp 02/19/24 1913 14     Temp 02/19/24 1913 97.8 F (36.6 C)     Temp Source 02/19/24 1913 Axillary     SpO2 02/19/24 1913 100 %     Weight --      Height --      Head Circumference --      Peak Flow --      Pain Score 02/19/24 1920 10     Pain Loc --      Pain Education --      Exclude from Growth Chart  --      Physical Exam Vitals and nursing note reviewed.  Constitutional:      General: He is not in acute distress.    Appearance: He is well-developed.  HENT:     Head: Normocephalic and atraumatic.  Eyes:     Conjunctiva/sclera: Conjunctivae normal.  Cardiovascular:     Rate and Rhythm: Normal rate and regular rhythm.     Heart sounds: No murmur heard. Pulmonary:     Effort: Pulmonary effort is normal. No respiratory distress.     Breath sounds: Normal breath sounds.  Abdominal:     Palpations: Abdomen is soft.     Tenderness: There is no abdominal tenderness.  Musculoskeletal:        General: No swelling.     Cervical back: Neck supple.  Feet:     Comments: Dorsum of the left foot is swollen and erythema Skin:    General: Skin is warm and  dry.     Capillary Refill: Capillary refill takes less than 2 seconds.  Neurological:     Mental Status: He is alert.  Psychiatric:        Mood and Affect: Mood normal.      Procedures   Procedures  ED Course - Medical Decision Making  Brief Overview Mark Hammond is a 33 y.o. male who presents as per above.  I have reviewed the nursing documentation for past medical history, family history, and social history and agree.  I have reviewed the patient's vital signs. There are no abnormalities.  Initial Differential Diagnoses: I am primarily concerned for opioid overdose, health maintenance screening.  Therapies: These medications and interventions were provided for the patient while in the ED.  Medications  ondansetron  (ZOFRAN -ODT) disintegrating tablet 4 mg (4 mg Oral Given 02/19/24 1940)  lactated ringers  bolus 1,000 mL (1,000 mLs Intravenous New Bag/Given 02/19/24 2030)    Testing Results: On my interpretation labs are significant for : UA without infection Gonorrhea/committee pending  On my interpretation imaging is significant for: Foot x-ray without fracture  EKG Interpretation Date/Time:  Monday Feb 19 2024  19:20:06 EDT Ventricular Rate:  119 PR Interval:  136 QRS Duration:  93 QT Interval:  333 QTC Calculation: 469 R Axis:   74  Text Interpretation: Sinus tachycardia Borderline prolonged QT interval Otherwise no significant change Confirmed by Celesta Coke ([id number]) on 02/19/2024 7:28:52 PM   See the EMR for full details regarding lab and imaging results.   Medical Decision Making 33 year old male who presents emergency department for presumed opioid overdose.  The patient was given Narcan  and he became responsive.  On exam patient does have a swollen left foot.  He reports that it is hurting.  He is unsure if he had any trauma.  He states he uses ice and heroin.  Lungs good auscultation bilaterally.  Patient is saturating well on room air.  Patient was placed on end-tidal and cardiac monitoring.  We will observe the patient here in the emergency department for recurrence of bradypnea.  Do not feel the chest x-ray is warranted currently.  Patient is not hypoxic.  Blood glucose en route was normal.  Patient does have an elevated pulse to 119.  This is likely in the setting of overdose.  We will monitor and if persistent we will further evaluate.  EKG reveals sinus tachycardia without ischemic change.  Patient did request urine testing because he felt like he had a UTI.  This resulted negative.  Patient also requested gonorrhea and chlamydia testing.  This is still pending.  If positive we will call patient.  Left foot x-ray was without fracture.  Do not feel this represents cellulitis.  Patient does not use IV drugs.  He is afebrile.  He is not warm to the touch.  Mildly tender to palpation.  No tenderness of the ankle.  Patient was observed on end-tidal here in the emergency department for approximately 3 hours.  Patient did not have resurgence of symptoms.  Therefore I feel that he is stable for discharge.  Patient will be discharged with a Narcan  prescription.  Patient declines resources as  he states he got them the other day.  Problems Addressed: Accidental drug overdose, initial encounter: acute illness or injury that poses a threat to life or bodily functions  Amount and/or Complexity of Data Reviewed Radiology: ordered.  Risk Prescription drug management.     ### All radiography studies, electrocardiograms, and laboratory data were personally  reviewed by me and incorporated into my medical decision making. Impression   1. Accidental drug overdose, initial encounter      Note: Engineer, civil (consulting) software was used in the creation of this note.     Arminda Landmark, MD 02/19/24 2220    Celesta Coke K, DO 02/19/24 2320

## 2024-02-20 ENCOUNTER — Other Ambulatory Visit: Payer: Self-pay

## 2024-02-20 ENCOUNTER — Emergency Department (HOSPITAL_COMMUNITY): Payer: MEDICAID

## 2024-02-20 ENCOUNTER — Encounter (HOSPITAL_COMMUNITY): Payer: Self-pay

## 2024-02-20 DIAGNOSIS — F259 Schizoaffective disorder, unspecified: Secondary | ICD-10-CM | POA: Diagnosis present

## 2024-02-20 DIAGNOSIS — Z96642 Presence of left artificial hip joint: Secondary | ICD-10-CM | POA: Diagnosis present

## 2024-02-20 DIAGNOSIS — F1010 Alcohol abuse, uncomplicated: Secondary | ICD-10-CM | POA: Diagnosis present

## 2024-02-20 DIAGNOSIS — B9561 Methicillin susceptible Staphylococcus aureus infection as the cause of diseases classified elsewhere: Secondary | ICD-10-CM | POA: Diagnosis not present

## 2024-02-20 DIAGNOSIS — M86152 Other acute osteomyelitis, left femur: Secondary | ICD-10-CM | POA: Diagnosis present

## 2024-02-20 DIAGNOSIS — M00052 Staphylococcal arthritis, left hip: Secondary | ICD-10-CM | POA: Diagnosis present

## 2024-02-20 DIAGNOSIS — T402X1A Poisoning by other opioids, accidental (unintentional), initial encounter: Secondary | ICD-10-CM | POA: Diagnosis present

## 2024-02-20 DIAGNOSIS — F17210 Nicotine dependence, cigarettes, uncomplicated: Secondary | ICD-10-CM | POA: Diagnosis present

## 2024-02-20 DIAGNOSIS — M86052 Acute hematogenous osteomyelitis, left femur: Secondary | ICD-10-CM

## 2024-02-20 DIAGNOSIS — F319 Bipolar disorder, unspecified: Secondary | ICD-10-CM | POA: Diagnosis present

## 2024-02-20 DIAGNOSIS — Z885 Allergy status to narcotic agent status: Secondary | ICD-10-CM | POA: Diagnosis not present

## 2024-02-20 DIAGNOSIS — I10 Essential (primary) hypertension: Secondary | ICD-10-CM | POA: Diagnosis not present

## 2024-02-20 DIAGNOSIS — L02416 Cutaneous abscess of left lower limb: Secondary | ICD-10-CM | POA: Diagnosis present

## 2024-02-20 DIAGNOSIS — M8668 Other chronic osteomyelitis, other site: Secondary | ICD-10-CM | POA: Diagnosis present

## 2024-02-20 DIAGNOSIS — E876 Hypokalemia: Secondary | ICD-10-CM | POA: Diagnosis present

## 2024-02-20 DIAGNOSIS — Z79899 Other long term (current) drug therapy: Secondary | ICD-10-CM | POA: Diagnosis not present

## 2024-02-20 DIAGNOSIS — F1920 Other psychoactive substance dependence, uncomplicated: Secondary | ICD-10-CM | POA: Diagnosis not present

## 2024-02-20 DIAGNOSIS — L03116 Cellulitis of left lower limb: Secondary | ICD-10-CM

## 2024-02-20 DIAGNOSIS — T401X1A Poisoning by heroin, accidental (unintentional), initial encounter: Secondary | ICD-10-CM | POA: Diagnosis present

## 2024-02-20 DIAGNOSIS — F1510 Other stimulant abuse, uncomplicated: Secondary | ICD-10-CM | POA: Diagnosis present

## 2024-02-20 DIAGNOSIS — B182 Chronic viral hepatitis C: Secondary | ICD-10-CM | POA: Diagnosis present

## 2024-02-20 DIAGNOSIS — J9601 Acute respiratory failure with hypoxia: Secondary | ICD-10-CM | POA: Diagnosis present

## 2024-02-20 DIAGNOSIS — M009 Pyogenic arthritis, unspecified: Secondary | ICD-10-CM | POA: Diagnosis not present

## 2024-02-20 DIAGNOSIS — E872 Acidosis, unspecified: Secondary | ICD-10-CM | POA: Diagnosis present

## 2024-02-20 DIAGNOSIS — M869 Osteomyelitis, unspecified: Secondary | ICD-10-CM | POA: Diagnosis not present

## 2024-02-20 DIAGNOSIS — D638 Anemia in other chronic diseases classified elsewhere: Secondary | ICD-10-CM | POA: Diagnosis present

## 2024-02-20 DIAGNOSIS — L0390 Cellulitis, unspecified: Secondary | ICD-10-CM | POA: Insufficient documentation

## 2024-02-20 DIAGNOSIS — F1410 Cocaine abuse, uncomplicated: Secondary | ICD-10-CM | POA: Diagnosis present

## 2024-02-20 DIAGNOSIS — Z5900 Homelessness unspecified: Secondary | ICD-10-CM | POA: Diagnosis not present

## 2024-02-20 DIAGNOSIS — R011 Cardiac murmur, unspecified: Secondary | ICD-10-CM | POA: Diagnosis not present

## 2024-02-20 DIAGNOSIS — F1120 Opioid dependence, uncomplicated: Secondary | ICD-10-CM | POA: Diagnosis present

## 2024-02-20 LAB — CBC WITH DIFFERENTIAL/PLATELET
Abs Immature Granulocytes: 0.06 10*3/uL (ref 0.00–0.07)
Basophils Absolute: 0.3 10*3/uL — ABNORMAL HIGH (ref 0.0–0.1)
Basophils Relative: 2 %
Eosinophils Absolute: 0 10*3/uL (ref 0.0–0.5)
Eosinophils Relative: 0 %
HCT: 35.7 % — ABNORMAL LOW (ref 39.0–52.0)
Hemoglobin: 11.6 g/dL — ABNORMAL LOW (ref 13.0–17.0)
Immature Granulocytes: 0 %
Lymphocytes Relative: 14 %
Lymphs Abs: 2.3 10*3/uL (ref 0.7–4.0)
MCH: 29.1 pg (ref 26.0–34.0)
MCHC: 32.5 g/dL (ref 30.0–36.0)
MCV: 89.7 fL (ref 80.0–100.0)
Monocytes Absolute: 1.1 10*3/uL — ABNORMAL HIGH (ref 0.1–1.0)
Monocytes Relative: 7 %
Neutro Abs: 12.8 10*3/uL — ABNORMAL HIGH (ref 1.7–7.7)
Neutrophils Relative %: 77 %
Platelets: 488 10*3/uL — ABNORMAL HIGH (ref 150–400)
RBC: 3.98 MIL/uL — ABNORMAL LOW (ref 4.22–5.81)
RDW: 15.6 % — ABNORMAL HIGH (ref 11.5–15.5)
WBC: 16.5 10*3/uL — ABNORMAL HIGH (ref 4.0–10.5)
nRBC: 0 % (ref 0.0–0.2)

## 2024-02-20 LAB — RAPID URINE DRUG SCREEN, HOSP PERFORMED
Amphetamines: POSITIVE — AB
Barbiturates: NOT DETECTED
Benzodiazepines: POSITIVE — AB
Cocaine: POSITIVE — AB
Opiates: POSITIVE — AB
Tetrahydrocannabinol: NOT DETECTED

## 2024-02-20 LAB — COMPREHENSIVE METABOLIC PANEL WITH GFR
ALT: 26 U/L (ref 0–44)
AST: 48 U/L — ABNORMAL HIGH (ref 15–41)
Albumin: 4.1 g/dL (ref 3.5–5.0)
Alkaline Phosphatase: 102 U/L (ref 38–126)
Anion gap: 12 (ref 5–15)
BUN: <5 mg/dL — ABNORMAL LOW (ref 6–20)
CO2: 27 mmol/L (ref 22–32)
Calcium: 9.1 mg/dL (ref 8.9–10.3)
Chloride: 97 mmol/L — ABNORMAL LOW (ref 98–111)
Creatinine, Ser: 0.56 mg/dL — ABNORMAL LOW (ref 0.61–1.24)
GFR, Estimated: >60 mL/min (ref >60)
Glucose, Bld: 145 mg/dL — ABNORMAL HIGH (ref 70–99)
Potassium: 3.3 mmol/L — ABNORMAL LOW (ref 3.5–5.1)
Sodium: 136 mmol/L (ref 135–145)
Total Bilirubin: 1 mg/dL (ref 0.0–1.2)
Total Protein: 8.8 g/dL — ABNORMAL HIGH (ref 6.5–8.1)

## 2024-02-20 LAB — GC/CHLAMYDIA PROBE AMP (~~LOC~~) NOT AT ARMC
Chlamydia: NEGATIVE
Comment: NEGATIVE
Comment: NORMAL
Neisseria Gonorrhea: NEGATIVE

## 2024-02-20 LAB — I-STAT CG4 LACTIC ACID, ED: Lactic Acid, Venous: 2 mmol/L (ref 0.5–1.9)

## 2024-02-20 LAB — MAGNESIUM: Magnesium: 1.5 mg/dL — ABNORMAL LOW (ref 1.7–2.4)

## 2024-02-20 LAB — LACTIC ACID, PLASMA: Lactic Acid, Venous: 1.6 mmol/L (ref 0.5–1.9)

## 2024-02-20 MED ORDER — SODIUM CHLORIDE 0.9 % IV SOLN
2.0000 g | Freq: Once | INTRAVENOUS | Status: AC
  Administered 2024-02-20: 2 g via INTRAVENOUS
  Filled 2024-02-20: qty 20

## 2024-02-20 MED ORDER — HYDROXYZINE HCL 25 MG PO TABS
25.0000 mg | ORAL_TABLET | Freq: Four times a day (QID) | ORAL | Status: DC | PRN
  Administered 2024-02-20 – 2024-03-16 (×11): 25 mg via ORAL
  Filled 2024-02-20 (×10): qty 1

## 2024-02-20 MED ORDER — ADULT MULTIVITAMIN W/MINERALS CH
1.0000 | ORAL_TABLET | Freq: Every day | ORAL | Status: DC
  Administered 2024-02-20 – 2024-03-25 (×30): 1 via ORAL
  Filled 2024-02-20 (×31): qty 1

## 2024-02-20 MED ORDER — SODIUM CHLORIDE 0.9 % IV SOLN: 2.0000 g | INTRAVENOUS | Status: DC

## 2024-02-20 MED ORDER — MORPHINE SULFATE (PF) 4 MG/ML IV SOLN
4.0000 mg | Freq: Once | INTRAVENOUS | Status: AC
  Administered 2024-02-20: 4 mg via INTRAVENOUS

## 2024-02-20 MED ORDER — LORAZEPAM 2 MG/ML IJ SOLN
1.0000 mg | INTRAMUSCULAR | Status: AC | PRN
  Administered 2024-02-20: 2 mg via INTRAVENOUS

## 2024-02-20 MED ORDER — FOLIC ACID 1 MG PO TABS
1.0000 mg | ORAL_TABLET | Freq: Every day | ORAL | Status: DC
  Administered 2024-02-20 – 2024-03-25 (×32): 1 mg via ORAL
  Filled 2024-02-20 (×33): qty 1

## 2024-02-20 MED ORDER — DIPHENHYDRAMINE HCL 50 MG/ML IJ SOLN
25.0000 mg | Freq: Once | INTRAMUSCULAR | Status: AC
  Administered 2024-02-20: 25 mg via INTRAVENOUS

## 2024-02-20 MED ORDER — ONDANSETRON HCL 4 MG/2ML IJ SOLN
4.0000 mg | Freq: Four times a day (QID) | INTRAMUSCULAR | Status: DC | PRN
  Administered 2024-02-23: 4 mg via INTRAVENOUS
  Filled 2024-02-20: qty 2

## 2024-02-20 MED ORDER — NICOTINE POLACRILEX 2 MG MT GUM: 2.0000 mg | CHEWING_GUM | OROMUCOSAL | Status: DC | PRN

## 2024-02-20 MED ORDER — NICOTINE 14 MG/24HR TD PT24
14.0000 mg | MEDICATED_PATCH | Freq: Every day | TRANSDERMAL | Status: DC
  Administered 2024-02-20 – 2024-03-25 (×33): 14 mg via TRANSDERMAL
  Filled 2024-02-20 (×34): qty 1

## 2024-02-20 MED ORDER — QUETIAPINE FUMARATE 25 MG PO TABS
50.0000 mg | ORAL_TABLET | Freq: Every day | ORAL | Status: DC
  Administered 2024-02-20 – 2024-03-24 (×30): 50 mg via ORAL
  Filled 2024-02-20 (×35): qty 2

## 2024-02-20 MED ORDER — POTASSIUM CHLORIDE CRYS ER 20 MEQ PO TBCR
20.0000 meq | EXTENDED_RELEASE_TABLET | Freq: Once | ORAL | Status: AC
  Administered 2024-02-20: 20 meq via ORAL
  Filled 2024-02-20: qty 1

## 2024-02-20 MED ORDER — VANCOMYCIN HCL IN DEXTROSE 1-5 GM/200ML-% IV SOLN
1000.0000 mg | Freq: Once | INTRAVENOUS | Status: AC
  Administered 2024-02-20: 1000 mg via INTRAVENOUS

## 2024-02-20 MED ORDER — HYDROCODONE-ACETAMINOPHEN 5-325 MG PO TABS
1.0000 | ORAL_TABLET | Freq: Four times a day (QID) | ORAL | Status: DC | PRN
  Administered 2024-02-20 – 2024-02-23 (×11): 1 via ORAL
  Filled 2024-02-20 (×11): qty 1

## 2024-02-20 MED ORDER — THIAMINE MONONITRATE 100 MG PO TABS
100.0000 mg | ORAL_TABLET | Freq: Every day | ORAL | Status: DC
  Administered 2024-02-20 – 2024-03-25 (×32): 100 mg via ORAL
  Filled 2024-02-20 (×33): qty 1

## 2024-02-20 MED ORDER — LORAZEPAM 1 MG PO TABS
1.0000 mg | ORAL_TABLET | ORAL | Status: AC | PRN
  Administered 2024-02-22: 1 mg via ORAL
  Filled 2024-02-20: qty 1

## 2024-02-20 MED ORDER — NYSTATIN 100000 UNIT/GM EX POWD
Freq: Three times a day (TID) | CUTANEOUS | Status: DC
  Filled 2024-02-20 (×2): qty 15

## 2024-02-20 MED ORDER — HYDROXYZINE HCL 25 MG PO TABS: 25.0000 mg | ORAL_TABLET | Freq: Four times a day (QID) | ORAL | Status: DC | PRN

## 2024-02-20 MED ORDER — MAGNESIUM SULFATE 2 GM/50ML IV SOLN
2.0000 g | Freq: Once | INTRAVENOUS | Status: AC
  Administered 2024-02-20: 2 g via INTRAVENOUS

## 2024-02-20 MED ORDER — ACETAMINOPHEN 325 MG PO TABS: 650.0000 mg | ORAL_TABLET | Freq: Four times a day (QID) | ORAL | Status: DC | PRN

## 2024-02-20 MED ORDER — ENOXAPARIN SODIUM 40 MG/0.4ML IJ SOSY
40.0000 mg | PREFILLED_SYRINGE | Freq: Every day | INTRAMUSCULAR | Status: DC
  Administered 2024-02-20 – 2024-02-21 (×2): 40 mg via SUBCUTANEOUS
  Filled 2024-02-20 (×2): qty 0.4

## 2024-02-20 MED ORDER — ACETAMINOPHEN 650 MG RE SUPP: 650.0000 mg | Freq: Four times a day (QID) | RECTAL | Status: DC | PRN

## 2024-02-20 MED ORDER — POLYETHYLENE GLYCOL 3350 17 G PO PACK
17.0000 g | PACK | Freq: Every day | ORAL | Status: DC | PRN
  Administered 2024-02-20: 17 g via ORAL

## 2024-02-20 MED ORDER — VANCOMYCIN HCL IN DEXTROSE 1-5 GM/200ML-% IV SOLN
1000.0000 mg | INTRAVENOUS | Status: DC
  Administered 2024-02-20 (×2): 1000 mg via INTRAVENOUS
  Filled 2024-02-20: qty 200

## 2024-02-20 MED ORDER — ONDANSETRON HCL 4 MG PO TABS: 4.0000 mg | ORAL_TABLET | Freq: Four times a day (QID) | ORAL | Status: DC | PRN

## 2024-02-20 MED ORDER — THIAMINE HCL 100 MG/ML IJ SOLN: 100.0000 mg | Freq: Every day | INTRAMUSCULAR | Status: DC

## 2024-02-20 NOTE — Progress Notes (Signed)
 Transition of Care [facility]) - Inpatient Brief Assessment   Patient Details  Name: Mark Hammond MRN: [id number] Date of Birth: 01-22-1991  [facility]) CM/SW Contact:    Dane Dung, RN Phone Number: 02/20/2024, 3:22 PM   Clinical Narrative: CM noted patient admitted for cellulitis of lower extremity.  Patient with recent history of amphetamine use.  Resource included in the AVs regarding OP counseling for substance abuse.  Follow up placed in the AVs for patient to call and schedule a hospital follow up in the next 7-10 days.   Transition of Care Asessment: Insurance and Status: (P) Insurance coverage has been reviewed Patient has primary care physician: (P) Yes Home environment has been reviewed: (P) from home Prior level of function:: (P) Independent Prior/Current Home Services: (P) No current home services Social Drivers of Health Review: (P) SDOH reviewed needs interventions Readmission risk has been reviewed: (P) Yes Transition of care needs: (P) transition of care needs identified, TOC will continue to follow

## 2024-02-20 NOTE — ED Provider Notes (Signed)
 [facility]-EM[facility]GENCY DEPT [facility] Emergency Department Provider Note MRN:  [id number]  Arrival date & time: 02/20/24     Chief Complaint   Leg Pain   History of Present Illness   Mark Hammond is a 33 y.o. year-old male with a history of bipolar disorder, substance use disorder presenting to the ED with chief complaint of leg pain.  Left foot and lower leg redness swelling and pain for the past few days.  Explains that he was recently admitted to the hospital and had a leg infection that required drainage of the infection.  He was also with fever and sepsis.  He denies fever currently.  Review of Systems  A thorough review of systems was obtained and all systems are negative except as noted in the HPI and PMH.   Patient's Health History    Past Medical History:  Diagnosis Date   Alcoholic gastritis    Bipolar 1 disorder (HCC)    Dental abscess    Depression    Drug abuse (HCC)    ETOH abuse    Hallucination    Schizo affective schizophrenia (HCC)     History reviewed. No pertinent surgical history.  Family History  Problem Relation Age of Onset   Stroke Other     Social History   Socioeconomic History   Marital status: Single    Spouse name: Not on file   Number of children: Not on file   Years of education: Not on file   Highest education level: Not on file  Occupational History   Not on file  Tobacco Use   Smoking status: Every Day    Current packs/day: 1.00    Types: Cigarettes    Passive exposure: Current   Smokeless tobacco: Never  Vaping Use   Vaping status: Every Day   Substances: THC  Substance and Sexual Activity   Alcohol use: Yes    Comment: 4-5x 40oz beers daily   Drug use: Yes    Frequency: 7.0 times per week    Types: "Crack" cocaine , Heroin, MDMA (Ecstacy), Marijuana, Cocaine     Comment: 01/22/24   Sexual activity: Yes  Other Topics Concern   Not on file  Social History Narrative   Pt lives in [place] with ex-girlfriend.  Pt is not  followed by an outpatient psychiatric provider.   Social Drivers of Corporate investment banker Strain: Not on file  Food Insecurity: Patient Unable To Answer (02/02/2024)   Hunger Vital Sign    Worried About Running Out of Food in the Last Year: Patient unable to answer    Ran Out of Food in the Last Year: Patient unable to answer  Recent Concern: Food Insecurity - Food Insecurity Present (12/06/2023)   Hunger Vital Sign    Worried About Running Out of Food in the Last Year: Often true    Ran Out of Food in the Last Year: Often true  Transportation Needs: Patient Unable To Answer (02/02/2024)   PRAPARE - Administrator, Civil Service (Medical): Patient unable to answer    Lack of Transportation (Non-Medical): Patient unable to answer  Recent Concern: Transportation Needs - Unmet Transportation Needs (01/21/2024)   PRAPARE - Administrator, Civil Service (Medical): No    Lack of Transportation (Non-Medical): Yes  Physical Activity: Not on file  Stress: Not on file  Social Connections: Not on file  Intimate Partner Violence: Patient Unable To Answer (02/02/2024)   Humiliation, Afraid, Rape, and Kick questionnaire  Fear of Current or Ex-Partner: Patient unable to answer    Emotionally Abused: Patient unable to answer    Physically Abused: Patient unable to answer    Sexually Abused: Patient unable to answer     Physical Exam   Vitals:   02/19/24 2359 02/20/24 0525  BP: 138/85 119/71  Pulse: (!) 117 100  Resp: 18 18  Temp: 98.5 F (36.9 C) 98.2 F (36.8 C)  SpO2: 94% 98%    CONSTITUTIONAL: Well-appearing, NAD NEURO/PSYCH:  Alert and oriented x 3, no focal deficits EYES:  eyes equal and reactive ENT/NECK:  no LAD, no JVD CARDIO: Regular rate, well-perfused, normal S1 and S2 PULM:  CTAB no wheezing or rhonchi GI/GU:  non-distended, non-tender MSK/SPINE:  No gross deformities, no edema SKIN: Left foot and ankle redness and increased warmth, tender to  palpation   *Additional and/or pertinent findings included in MDM below  Diagnostic and Interventional Summary    EKG Interpretation Date/Time:    Ventricular Rate:    PR Interval:    QRS Duration:    QT Interval:    QTC Calculation:   R Axis:      Text Interpretation:         Labs Reviewed  COMPREHENSIVE METABOLIC PANEL WITH GFR - Abnormal; Notable for the following components:      Result Value   Potassium 3.3 (*)    Chloride 97 (*)    Glucose, Bld 145 (*)    BUN <5 (*)    Creatinine, Ser 0.56 (*)    Total Protein 8.8 (*)    AST 48 (*)    All other components within normal limits  CBC WITH DIFFERENTIAL/PLATELET - Abnormal; Notable for the following components:   WBC 16.5 (*)    RBC 3.98 (*)    Hemoglobin 11.6 (*)    HCT 35.7 (*)    RDW 15.6 (*)    Platelets 488 (*)    Neutro Abs 12.8 (*)    Monocytes Absolute 1.1 (*)    Basophils Absolute 0.3 (*)    All other components within normal limits  I-STAT CG4 LACTIC ACID, ED - Abnormal; Notable for the following components:   Lactic Acid, Venous 2.0 (*)    All other components within normal limits  CULTURE, BLOOD (ROUTINE X 2)  CULTURE, BLOOD (ROUTINE X 2)  I-STAT CG4 LACTIC ACID, ED    DG Foot Complete Left  Final Result    DG Tibia/Fibula Left  Final Result      Medications  morphine  (PF) 4 MG/ML injection 4 mg (has no administration in time range)  diphenhydrAMINE  (BENADRYL ) injection 25 mg (has no administration in time range)  vancomycin  (VANCOCIN ) IVPB 1000 mg/200 mL premix (has no administration in time range)  cefTRIAXone  (ROCEPHIN ) 2 g in sodium chloride  0.9 % 100 mL IVPB (0 g Intravenous Stopped 02/20/24 0600)     Procedures  /  Critical Care Procedures  ED Course and Medical Decision Making  Initial Impression and Ddx Recent admission for sepsis and septic arthritis of the left hip, did not have redness or infection to the lower leg or foot during that admission, now presenting with what  appears to be cellulitis of the lower leg.  Afebrile, tachycardic, will need antibiotics and admission.  Unclear if imaging is indicated at this time, will obtain plain film.  Past medical/surgical history that increases complexity of ED encounter: History of septic arthritis  Interpretation of Diagnostics I personally reviewed the Laboratory Testing and  my interpretation is as follows: Leukocytosis    Patient Reassessment and Ultimate Disposition/Management     Admission to medicine.  Patient management required discussion with the following services or consulting groups:  Hospitalist Service  Complexity of Problems Addressed Acute illness or injury that poses threat of life of bodily function  Additional Data Reviewed and Analyzed Further history obtained from: Prior labs/imaging results  Additional Factors Impacting ED Encounter Risk Consideration of hospitalization  Merrick Abe. Harless Lien, MD [facility] Emergency Medicine [facility] [email or web address]  Final Clinical Impressions(s) / ED Diagnoses     ICD-10-CM   1. Cellulitis of left lower extremity  L03.116       ED Discharge Orders     None        Discharge Instructions Discussed with and Provided to Patient:   Discharge Instructions   None      Edson Graces, MD 02/20/24 [phone]

## 2024-02-20 NOTE — Progress Notes (Signed)
 ED Pharmacy Antibiotic Sign Off An antibiotic consult was received from an ED provider for Vancomycin per pharmacy dosing for cellulitis. A chart review was completed to assess appropriateness.   The following one time order(s) were placed:  Vancomycin 1000 mg IV x 1  Further antibiotic and/or antibiotic pharmacy consults should be ordered by the admitting provider if indicated.   Abran Duke, PharmD, BCPS Clinical Pharmacist Phone: [phone]
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
## 2024-02-20 NOTE — Progress Notes (Signed)
 Pharmacy Antibiotic Note  Mark Hammond is a 33 y.o. male admitted on 02/19/2024 with complaint of leg pain.  Pharmacy has been consulted for vancomycin  dosing. The patient was recently discharged from the hospital (5/16) and returned 5/19 after being found unresponsive and admitting to several days of drug binging. Patient was previously therapeutic on vancomycin  1G q8, renal function stable.  Plan: Vancomycin  1000mg  IV q8 hours Order levels as appropriate   Height: 5\' 3"  (160 cm) Weight: 56.3 kg (124 lb 1.9 oz) IBW/kg (Calculated) : 56.9  Temp (24hrs), Avg:98.5 F (36.9 C), Min:97.8 F (36.6 C), Max:99.5 F (37.5 C)  Recent Labs  Lab 02/20/24 0019 02/20/24 0028  WBC 16.5*  --   CREATININE 0.56*  --   LATICACIDVEN  --  2.0*    Estimated Creatinine Clearance: 105.6 mL/min (A) (by C-G formula based on SCr of 0.56 mg/dL (L)).    Allergies  Allergen Reactions   Codeine Other (See Comments)    Patient states parents told him he was allergic at a young age.   Fish Allergy Other (See Comments)    Patient does not wish to eat ANY fish- reaction unclear   Shellfish Allergy Other (See Comments)    Patient does not wish to eat ANY shellfish- reaction unclear    Antimicrobials this admission: 5/19 vancomycin  >   Dose adjustments this admission: 5/6 AUC 338, below goal range. On vancomycin  1 G IV q12 hours 5/8 VT 15   Microbiology results: 4/30 blood: ngF 5/1 MRSA PCR: neg 5/5 surgical (L leg abscess): SA 5/5 hep C RNA+ 5/20 Bcx  Thank you for allowing pharmacy to be a part of this patient's care.  Angelo Kennedy Dedria Endres 02/20/2024 9:27 AM

## 2024-02-20 NOTE — ED Triage Notes (Signed)
 Pt recently discharged from ED for different complaint. Pt states that he thinks he has cellulitis in his left leg/foot.  10/10 pain. Pt states that he did not address it with previous provider. Left foot noted to be red, edematous and warm to touch.
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
## 2024-02-21 ENCOUNTER — Inpatient Hospital Stay: Payer: MEDICAID | Admitting: Internal Medicine

## 2024-02-21 ENCOUNTER — Inpatient Hospital Stay (HOSPITAL_COMMUNITY): Payer: MEDICAID

## 2024-02-21 DIAGNOSIS — D638 Anemia in other chronic diseases classified elsewhere: Secondary | ICD-10-CM

## 2024-02-21 DIAGNOSIS — L03116 Cellulitis of left lower limb: Secondary | ICD-10-CM | POA: Diagnosis not present

## 2024-02-21 LAB — SEDIMENTATION RATE: Sed Rate: 30 mm/h — ABNORMAL HIGH (ref 0–16)

## 2024-02-21 LAB — COMPREHENSIVE METABOLIC PANEL WITH GFR
ALT: 22 U/L (ref 0–44)
AST: 41 U/L (ref 15–41)
Albumin: 3.3 g/dL — ABNORMAL LOW (ref 3.5–5.0)
Alkaline Phosphatase: 103 U/L (ref 38–126)
Anion gap: 11 (ref 5–15)
BUN: 6 mg/dL (ref 6–20)
CO2: 29 mmol/L (ref 22–32)
Calcium: 9 mg/dL (ref 8.9–10.3)
Chloride: 96 mmol/L — ABNORMAL LOW (ref 98–111)
Creatinine, Ser: 0.54 mg/dL — ABNORMAL LOW (ref 0.61–1.24)
GFR, Estimated: >60 mL/min (ref >60)
Glucose, Bld: 114 mg/dL — ABNORMAL HIGH (ref 70–99)
Potassium: 3 mmol/L — ABNORMAL LOW (ref 3.5–5.1)
Sodium: 136 mmol/L (ref 135–145)
Total Bilirubin: 0.6 mg/dL (ref 0.0–1.2)
Total Protein: 6.9 g/dL (ref 6.5–8.1)

## 2024-02-21 LAB — CBC
HCT: 31.4 % — ABNORMAL LOW (ref 39.0–52.0)
Hemoglobin: 10.3 g/dL — ABNORMAL LOW (ref 13.0–17.0)
MCH: 29.3 pg (ref 26.0–34.0)
MCHC: 32.8 g/dL (ref 30.0–36.0)
MCV: 89.2 fL (ref 80.0–100.0)
Platelets: 242 10*3/uL (ref 150–400)
RBC: 3.52 MIL/uL — ABNORMAL LOW (ref 4.22–5.81)
RDW: 15.1 % (ref 11.5–15.5)
WBC: 5.3 10*3/uL (ref 4.0–10.5)
nRBC: 0 % (ref 0.0–0.2)

## 2024-02-21 LAB — MISC LABCORP TEST (SEND OUT): Labcorp test code: 83935

## 2024-02-21 LAB — RPR: RPR Ser Ql: NONREACTIVE

## 2024-02-21 LAB — MAGNESIUM: Magnesium: 1.6 mg/dL — ABNORMAL LOW (ref 1.7–2.4)

## 2024-02-21 LAB — C-REACTIVE PROTEIN: CRP: 8.5 mg/dL — ABNORMAL HIGH (ref <1.0)

## 2024-02-21 MED ORDER — MAGNESIUM SULFATE 2 GM/50ML IV SOLN
2.0000 g | Freq: Once | INTRAVENOUS | Status: AC
  Administered 2024-02-21: 2 g via INTRAVENOUS
  Filled 2024-02-21: qty 50

## 2024-02-21 MED ORDER — GADOBUTROL 1 MMOL/ML IV SOLN
5.0000 mL | Freq: Once | INTRAVENOUS | Status: AC | PRN
  Administered 2024-02-21: 5 mL via INTRAVENOUS

## 2024-02-21 MED ORDER — CEFAZOLIN SODIUM-DEXTROSE 2-4 GM/100ML-% IV SOLN
2.0000 g | Freq: Three times a day (TID) | INTRAVENOUS | Status: AC
  Administered 2024-02-21 – 2024-03-19 (×80): 2 g via INTRAVENOUS
  Filled 2024-02-21 (×82): qty 100

## 2024-02-21 MED ORDER — MAGNESIUM OXIDE -MG SUPPLEMENT 400 (240 MG) MG PO TABS
400.0000 mg | ORAL_TABLET | Freq: Two times a day (BID) | ORAL | Status: DC
  Administered 2024-02-21 – 2024-03-25 (×62): 400 mg via ORAL
  Filled 2024-02-21 (×66): qty 1

## 2024-02-21 MED ORDER — POTASSIUM CHLORIDE 10 MEQ/100ML IV SOLN
INTRAVENOUS | Status: AC
  Filled 2024-02-21: qty 100

## 2024-02-21 MED ORDER — POTASSIUM CHLORIDE CRYS ER 20 MEQ PO TBCR
40.0000 meq | EXTENDED_RELEASE_TABLET | Freq: Once | ORAL | Status: DC
  Filled 2024-02-21: qty 2

## 2024-02-21 MED ORDER — POTASSIUM CHLORIDE 10 MEQ/100ML IV SOLN
10.0000 meq | INTRAVENOUS | Status: AC
  Administered 2024-02-21 (×2): 10 meq via INTRAVENOUS
  Filled 2024-02-21 (×2): qty 100

## 2024-02-21 NOTE — Progress Notes (Signed)
 BRIEF ID UPDATE:   MRI still pending for left hip and ankle. I have a feeling we may need to reengage with ortho team for reconsideration of surgery if MRI shows fluid re accumulation or effusion to the ankle.   Will continue cefazolin  IV for now CRP is up from earlier this month as trended below.   Sed Rate (mm/hr)  Date Value  02/21/2024 30 (H)  01/31/2024 77 (H)  11/19/2023 77 (H)   CRP (mg/dL)  Date Value  16/07/9603 8.5 (H)  02/12/2024 0.8  02/08/2024 1.5 (H)   Will check in with him tomorrow after we have more information to update him with.    Gibson Kurtz, MSN, NP-C [facility] for Infectious Disease [facility]  [email or web address] Pager: [phone] Office: [phone] [facility] Main Line: [phone] *Secure Chat Communication Welcome

## 2024-02-21 NOTE — Plan of Care (Signed)
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
## 2024-02-21 NOTE — Progress Notes (Signed)
 Psychosocial Progressive/Outcome: ANOx4, calm and cooperative   Pain/Comfort Progression/Outcome: Pain managed with PRN medication   Clinical Progression/Outcome: Adequate fluid and diet intake  Independent in positioning  X1 assist w/ walker  Voids to urinal or BR No BM reported  CIWA protocol maintained  Opiote withdrawal protocol maintained  Slept in between care Maintained safety

## 2024-02-22 DIAGNOSIS — M869 Osteomyelitis, unspecified: Secondary | ICD-10-CM

## 2024-02-22 DIAGNOSIS — L03116 Cellulitis of left lower limb: Secondary | ICD-10-CM | POA: Diagnosis not present

## 2024-02-22 LAB — CBC
HCT: 31.3 % — ABNORMAL LOW (ref 39.0–52.0)
Hemoglobin: 10 g/dL — ABNORMAL LOW (ref 13.0–17.0)
MCH: 28.8 pg (ref 26.0–34.0)
MCHC: 31.9 g/dL (ref 30.0–36.0)
MCV: 90.2 fL (ref 80.0–100.0)
Platelets: 248 10*3/uL (ref 150–400)
RBC: 3.47 MIL/uL — ABNORMAL LOW (ref 4.22–5.81)
RDW: 15 % (ref 11.5–15.5)
WBC: 3.5 10*3/uL — ABNORMAL LOW (ref 4.0–10.5)
nRBC: 0 % (ref 0.0–0.2)

## 2024-02-22 LAB — BASIC METABOLIC PANEL WITH GFR
Anion gap: 10 (ref 5–15)
BUN: <5 mg/dL — ABNORMAL LOW (ref 6–20)
CO2: 28 mmol/L (ref 22–32)
Calcium: 9.3 mg/dL (ref 8.9–10.3)
Chloride: 103 mmol/L (ref 98–111)
Creatinine, Ser: 0.54 mg/dL — ABNORMAL LOW (ref 0.61–1.24)
GFR, Estimated: >60 mL/min (ref >60)
Glucose, Bld: 93 mg/dL (ref 70–99)
Potassium: 3.5 mmol/L (ref 3.5–5.1)
Sodium: 141 mmol/L (ref 135–145)

## 2024-02-22 LAB — MAGNESIUM: Magnesium: 1.9 mg/dL (ref 1.7–2.4)

## 2024-02-22 MED ORDER — POTASSIUM CHLORIDE CRYS ER 20 MEQ PO TBCR
40.0000 meq | EXTENDED_RELEASE_TABLET | Freq: Once | ORAL | Status: AC
  Administered 2024-02-22: 40 meq via ORAL
  Filled 2024-02-22: qty 2

## 2024-02-22 NOTE — Consult Note (Addendum)
 ORTHOPAEDIC CONSULTATION  REQUESTING PHYSICIAN: Pokhrel, Amador Bad, MD  Chief Complaint: Left hip septic arthritis, osteomyelitis  HPI: Mark Hammond is a 33 yo male bipolar, ivdu recent admission with left leg pain on mri showing left hip septic arthritis/femoral head s/p ir drainage, discharged on cefadroxil  after about 10 days induction iv abx now returned with ongoing left hip, ankle pain unable to bear weight and losing his oral abx.  He is homeless.  Ortho consulted.  Past Medical History:  Diagnosis Date   Alcoholic gastritis    Bipolar 1 disorder (HCC)    Dental abscess    Depression    Drug abuse (HCC)    ETOH abuse    Hallucination    Schizo affective schizophrenia (HCC)    History reviewed. No pertinent surgical history. Social History   Socioeconomic History   Marital status: Single    Spouse name: Not on file   Number of children: Not on file   Years of education: Not on file   Highest education level: Not on file  Occupational History   Not on file  Tobacco Use   Smoking status: Every Day    Current packs/day: 1.00    Types: Cigarettes    Passive exposure: Current   Smokeless tobacco: Never  Vaping Use   Vaping status: Every Day   Substances: THC  Substance and Sexual Activity   Alcohol use: Yes    Comment: 4-5x 40oz beers daily   Drug use: Yes    Frequency: 7.0 times per week    Types: "Crack" cocaine , Heroin, MDMA (Ecstacy), Marijuana, Cocaine     Comment: 01/22/24   Sexual activity: Yes  Other Topics Concern   Not on file  Social History Narrative   Pt lives in [place] with ex-girlfriend.  Pt is not followed by an outpatient psychiatric provider.   Social Drivers of Corporate investment banker Strain: Not on file  Food Insecurity: Food Insecurity Present (02/20/2024)   Hunger Vital Sign    Worried About Running Out of Food in the Last Year: Often true    Ran Out of Food in the Last Year: Often true  Transportation Needs: Unmet Transportation Needs  (02/20/2024)   PRAPARE - Administrator, Civil Service (Medical): Yes    Lack of Transportation (Non-Medical): Yes  Physical Activity: Not on file  Stress: Not on file  Social Connections: Unknown (02/20/2024)   Social Connection and Isolation Panel [NHANES]    Frequency of Communication with Friends and Family: Once a week    Frequency of Social Gatherings with Friends and Family: More than three times a week    Attends Religious Services: More than 4 times per year    Active Member of [employer] or Organizations: Patient declined    Attends Engineer, structural: Patient declined    Marital Status: Never married   Family History  Problem Relation Age of Onset   Stroke Other    - negative except otherwise stated in the family history section Allergies  Allergen Reactions   Codeine Other (See Comments)    Patient states parents told him he was allergic at a young age.   Fish Allergy Other (See Comments)    Patient does not wish to eat ANY fish- reaction unclear   Shellfish Allergy Other (See Comments)    Patient does not wish to eat ANY shellfish- reaction unclear   Prior to Admission medications   Medication Sig Start Date End Date Taking? Authorizing Provider  acetaminophen  (TYLENOL ) 500 MG tablet Take 1,000-1,500 mg by mouth every 6 (six) hours as needed for mild pain (pain score 1-3) or headache.   Yes [provider]  ferrous sulfate  325 (65 FE) MG tablet Take 1 tablet (325 mg total) by mouth daily with breakfast. Patient not taking: Reported on 02/20/2024 01/17/24   Joice Nares, MD  folic acid  (FOLVITE ) 1 MG tablet Take 1 tablet (1 mg total) by mouth daily. Patient not taking: Reported on 02/20/2024 02/16/24   Elgergawy, Ardia Kraft, MD  hydrOXYzine  (ATARAX ) 25 MG tablet Take 25 mg by mouth every 6 (six) hours as needed for anxiety (or sleep). Patient not taking: Reported on 02/20/2024    [provider]  midodrine  (PROAMATINE ) 10 MG tablet Take  1 tablet (10 mg total) by mouth 2 (two) times daily with a meal. Patient not taking: Reported on 02/20/2024 02/16/24   Elgergawy, Ardia Kraft, MD  naloxone  (NARCAN ) nasal spray 4 mg/0.1 mL If suspected opioid overdose insert into nostril and follow package instructions. Patient not taking: Reported on 02/20/2024 02/19/24   Arminda Landmark, MD  Nutritional Supplements (,FEEDING SUPPLEMENT, PROSOURCE PLUS) liquid Take 30 mLs by mouth 2 (two) times daily between meals. Patient not taking: Reported on 02/20/2024 02/16/24   Elgergawy, Ardia Kraft, MD  nystatin  (MYCOSTATIN /NYSTOP ) powder Apply topically 3 (three) times daily. Patient not taking: Reported on 02/20/2024 02/16/24   Elgergawy, Ardia Kraft, MD  QUEtiapine  (SEROQUEL ) 50 MG tablet Take 1 tablet (50 mg total) by mouth at bedtime. Patient not taking: Reported on 02/20/2024 02/16/24   Elgergawy, Ardia Kraft, MD  thiamine  (VITAMIN B-1) 100 MG tablet Take 1 tablet (100 mg total) by mouth daily. Patient not taking: Reported on 02/20/2024 02/16/24   Elgergawy, Ardia Kraft, MD   MR HIP LEFT W WO CONTRAST Result Date: 02/21/2024 CLINICAL DATA:  Reassess for recanalized a diffusion a progression of known infection. Increasing left hip pain. EXAM: MRI OF THE LEFT HIP WITHOUT AND WITH CONTRAST TECHNIQUE: Multiplanar, multisequence MR imaging was performed both before and after administration of intravenous contrast. CONTRAST:  5mL GADAVIST  GADOBUTROL  1 MMOL/ML IV SOLN COMPARISON:  CT left femur 02/08/2024, MRI left femur 02/03/2024, CT pelvis 02/04/2024 FINDINGS: Bones/joints/cartilage There is again high-grade marrow edema and enhancement throughout the left femoral head and neck extending into the greater trochanter. There is moderate erosion of the superior left femoral head which appears mildly worsened from 02/03/2024 MRI. There is a moderate left femoroacetabular joint effusion with moderate synovial thickening, similar to prior. There is linear decreased T1 and increased T2  signal suspicious for a pathologic fracture from the weakened bone within the proximal left femoral neck, extending to the posterior cortex (coronal series 7 images 16 through 21 and axial series 4 images 25 through 27). In retrospect, the early start of an acute fracture was likely present on 02/03/2024, however this is worsened from prior. No sacroiliac, pubic symphysis joint effusion. Minimal right femoroacetabular joint effusion is unchanged from 02/03/2024. No internal complexity to specifically suggest right hip septic arthritis. Ligaments No significant ligamentous abnormality is seen. Muscles and tendons. There is diffuse edema throughout the left gluteus minimus and medius muscles, mildly improved from 02/03/2024. The prior lobular walled-off abscesses within the deep aspect of the left gluteus minimus and medius muscles on 02/03/2024 are no longer seen. The previously seen abscess at the lateral border of the proximal rectus femoris muscle on 02/03/2024 MRI is no longer visualized. However, there is complex, rim enhancing fluid that  is new from prior bordering the anterior aspect of the left femoroacetabular joint (sagittal series 6, image 13, axial series 4, image 30, axial series 19 image 27, coronal series 7, image 11. This is concerning for a rim enhancing abscess measuring up to 3.1 x 2.7 x 3.5 cm (transverse by AP by craniocaudal) versus infected ganglion extending anteriorly from the inferior femoroacetabular joint space, new from prior. There is moderate edema throughout the left adductor musculature, slightly improved from 02/03/2024. There is again moderate fluid deep to the left iliacus muscle, as on 02/03/2024. There is a small amount of simple appearing fluid with a thin wall extending just medial to the distal iliopsoas tendon insertion (axial series 4, image 40 and coronal series 2, image 13 which is minimally increased from 02/03/2024 and likely incidental. Soft tissues Interval improvement  in edema within the left operator internus muscle (axial series 4, image 28). No fluid collection or rim enhancing abscess is seen within the visualized portions of the intrapelvic soft tissues. IMPRESSION: Compared to 02/03/2024: 1. Persistent high-grade marrow edema and enhancement throughout the left femoral head and neck extending into the greater trochanter. There is moderate erosion of the superior left femoral head which appears mildly worsened from 02/03/2024 MRI. There is a moderate left femoroacetabular joint effusion with moderate synovial thickening, similar to prior. Findings are consistent with left hip septic arthritis and mildly worsening proximal left femoral osteomyelitis. 2. There is linear decreased T1 and increased T2 signal suspicious for a pathologic fracture from the osteomyelitis weakened bone. This nondisplaced fracture extends through the mid to posterior aspect of the proximal left femoral neck to the posterior cortex. In retrospect, the early start of an acute fracture was likely present on 02/03/2024, however this is worsened from prior. 3. There is complex, rim enhancing fluid that is new from prior bordering the anterior aspect of the left femoroacetabular joint. This is concerning for a rim enhancing abscess versus infected ganglion extending anteriorly from the inferior femoroacetabular joint space measuring up to 3.5 cm, new from prior. 4. The prior lobular walled-off abscesses within the deep aspect of the left gluteus minimus and medius muscles on 02/03/2024 are no longer seen. The previously seen abscess at the lateral border of the proximal rectus femoris muscle on 02/03/2024 MRI is no longer visualized. 5. There is moderate edema throughout the left adductor musculature, slightly improved from 02/03/2024. There is again moderate fluid deep to the left iliacus muscle, as on 02/03/2024. Electronically Signed   By: Bertina Broccoli M.D.   On: 02/21/2024 14:29   MR ANKLE LEFT W WO  CONTRAST Result Date: 02/21/2024 CLINICAL DATA:  Evaluate for septic joint. Extreme pain. Known left hip septic arthritis. EXAM: MRI OF THE LEFT ANKLE WITHOUT AND WITH CONTRAST TECHNIQUE: Multiplanar, multisequence MR imaging of the ankle was performed before and after the administration of intravenous contrast. CONTRAST:  5mL GADAVIST  GADOBUTROL  1 MMOL/ML IV SOLN COMPARISON:  Left tibia and fibula and left foot radiographs 02/20/2024 FINDINGS: TENDONS Peroneal: Minimal peroneus longus and brevis tenosynovitis. There is a "chevron configuration" of the peroneus brevis tendon starting at the level of the distal fibular epiphysis and involving an approximate 1.5 cm length of the tendon, a mild partial-thickness longitudinal tear. Posteromedial: Mild posterior tibial tenosynovitis. The flexor digitorum longus and flexor hallucis longus tendons are intact. Anterior: The tibialis anterior, extensor hallucis longus, and extensor digitorum longus tendons are intact. Achilles: Intact. Plantar Fascia: Intact. LIGAMENTS Lateral: The anterior and posterior talofibular, anterior and  posterior tibiofibular, and calcaneofibular ligaments are intact. Medial: The tibiotalar deep deltoid and tibial spring ligaments are intact. CARTILAGE Ankle Joint: Mild thinning of the anterior aspect of the mid transverse dimension of the talar dome cartilage with mild subchondral increased T2 signal (coronal series 15, images 19 and 20). No tibiotalar joint effusion. Subtalar Joints/Sinus Tarsi: Mild partial-thickness thinning of the posterior aspect of the posterior subtalar joint. Fat is preserved within the sinus tarsi. Bones: There is mild marrow edema within the posterior aspect of the medial malleolus, likely stress related/reactive. Other: There is mild subcutaneous fat edema and swelling of the dorsal midfoot and lateral ankle. No rim enhancing abscess is seen. IMPRESSION: IMPRESSION 1. Mild subcutaneous fat edema and swelling of the  dorsal midfoot and lateral ankle. No rim enhancing abscess is seen. 2. Mild posterior tibial tenosynovitis. MRI cannot determine the presence or absence of infection within the tenosynovitis fluid, however no complicating features are seen to specifically suggest infectious tenosynovitis. 3. Mild partial-thickness longitudinal tear of the peroneus brevis tendon starting at the level of the distal fibular epiphysis and involving an approximate 1.5 cm length of the tendon. Minimal peroneus longus and brevis tenosynovitis. 4. Mild marrow edema within the posterior aspect of the medial malleolus, likely stress related/reactive. 5. Mild thinning of the anterior aspect of the mid transverse dimension of the talar dome cartilage with mild subchondral increased T2 signal. No tibiotalar joint effusion. Electronically Signed   By: Bertina Broccoli M.D.   On: 02/21/2024 14:02   - pertinent xrays, CT, MRI studies were reviewed and independently interpreted  Positive ROS: All other systems have been reviewed and were otherwise negative with the exception of those mentioned in the HPI and as above.  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 - discussed with patient that if  the infection can be eradicated, he would be a candidate for reimplantation with TH[facility] down the road but also possibility of removal of hip spacer and girdlestone procedure if infection cannot be eradicated - he understands that the girdlestone procedure is fairly functionally limited - he is tentatively scheduled for tomorrow - NPO after midnight  Thank you for the consult and the opportunity to see Mr. Mark Hammond. Claria Crofts, MD [facility] 2:57 PM

## 2024-02-22 NOTE — Plan of Care (Signed)

## 2024-02-22 NOTE — Progress Notes (Signed)
 [facility]  Date of Admission:  02/19/2024       Abx: 5/19-c cefazolin    ASSESSMENT: 33 yo male bipolar, ivdu recent admission with left leg pain on mri showing left hip septic arthritis/femoral head om s/p ir drainage, discharged on cefadroxil  after about 10 days induction iv abx now returned with ongoing left hip, ankle pain unable to bear weight and losing his oral abx   5/20 blood cx negative 5/08 bcx negative 5/05 left hip aspirate cx mssa (s doxy, bactrim)   Discussed repeat mri hip finding with patient. Discussed with ortho Dr Julio Ohm who will discuss with his partner about girdle stone procedure and I&D  Mri hip also suggest left femoral neck pathologic fracture  Left ankle mri appears to be without septic arthritis/om (edema medial malleolus bone reactive vs early om); finding mostly suggest tenosynovitis     PLAN: Appreciate ortho input Continue cefazolin  Maintain standard isolation precaution Discussed with primary team      Principal Problem:   Cellulitis Active Problems:   Polysubstance (including opioids) dependence, daily use (HCC)   Cellulitis of left lower extremity   Anemia of chronic disease   Transaminitis   Allergies  Allergen Reactions   Codeine Other (See Comments)    Patient states parents told him he was allergic at a young age.   Fish Allergy Other (See Comments)    Patient does not wish to eat ANY fish- reaction unclear   Shellfish Allergy Other (See Comments)    Patient does not wish to eat ANY shellfish- reaction unclear    Scheduled Meds:  enoxaparin  (LOVENOX ) injection  40 mg Subcutaneous Daily   folic acid   1 mg Oral Daily   magnesium  oxide  400 mg Oral BID   multivitamin with minerals  1 tablet Oral Daily   nicotine   14 mg Transdermal Daily   nystatin    Topical TID   potassium chloride   40 mEq Oral Once   QUEtiapine   50 mg Oral QHS   thiamine   100 mg Oral Daily   Continuous  Infusions:   ceFAZolin  (ANCEF ) IV 2 g (02/22/24 0629)   PRN Meds:.acetaminophen  **OR** acetaminophen , HYDROcodone -acetaminophen , hydrOXYzine , LORazepam  **OR** LORazepam , nicotine  polacrilex, ondansetron  **OR** ondansetron  (ZOFRAN ) IV, polyethylene glycol   SUBJECTIVE: Moderate-severe pain still left hip Afebrile No n/v/diarrhea    Review of Systems: ROS All other ROS was negative, except mentioned above     OBJECTIVE: Vitals:   02/21/24 1735 02/21/24 2043 02/22/24 0500 02/22/24 0832  BP: 118/72 117/63 120/69 120/75  Pulse: 75 67 61 75  Resp: 18 18 18 18   Temp: 99.4 F (37.4 C) 99 F (37.2 C) 98 F (36.7 C) (!) 97.5 F (36.4 C)  TempSrc:  Oral Oral   SpO2: 100% 99% 100% 100%  Weight:      Height:       Body mass index is 21.99 kg/m.  Physical Exam General/constitutional: no distress, pleasant HEENT: Normocephalic, PER, Conj Clear, EOMI, Oropharynx clear Neck supple CV: rrr no mrg Lungs: clear to auscultation, normal respiratory effort Abd: Soft, Nontender Ext: no edema Skin: No Rash Neuro: nonfocal MSK: left ankle/foot mild swelling/tender/erythematous; left hip rom very limited and tender to palpation/rom  Lab Results Lab Results  Component Value Date   WBC 3.5 (L) 02/22/2024   HGB 10.0 (L) 02/22/2024   HCT 31.3 (L) 02/22/2024   MCV 90.2 02/22/2024   PLT 248 02/22/2024    Lab Results  Component Value Date   CREATININE 0.54 (L) 02/22/2024   BUN <5 (L) 02/22/2024   NA 141 02/22/2024   K 3.5 02/22/2024   CL 103 02/22/2024   CO2 28 02/22/2024    Lab Results  Component Value Date   ALT 22 02/21/2024   AST 41 02/21/2024   ALKPHOS 103 02/21/2024   BILITOT 0.6 02/21/2024      Microbiology: Recent Results (from the past 240 hours)  Culture, blood (Routine X 2) w Reflex to ID Panel     Status: None (Preliminary result)   Collection Time: 02/20/24  7:37 AM   Specimen: BLOOD LEFT HAND  Result Value Ref Range Status   Specimen Description BLOOD  LEFT HAND  Final   Special Requests   Final    BOTTLES DRAWN AEROBIC AND ANAEROBIC Blood Culture adequate volume   Culture   Final    NO GROWTH 2 DAYS Performed at [facility], [address], [place], [place] [address]    Report Status PENDING  Incomplete  Culture, blood (Routine X 2) w Reflex to ID Panel     Status: None (Preliminary result)   Collection Time: 02/20/24  7:45 AM   Specimen: BLOOD LEFT HAND  Result Value Ref Range Status   Specimen Description BLOOD LEFT HAND  Final   Special Requests   Final    BOTTLES DRAWN AEROBIC AND ANAEROBIC Blood Culture adequate volume   Culture   Final    NO GROWTH 2 DAYS Performed at [facility], [address], [place], [place] [address]    Report Status PENDING  Incomplete     Serology:   Imaging: If present, new imagings (plain films, ct scans, and mri) have been personally visualized and interpreted; radiology reports have been reviewed. Decision making incorporated into the Impression / Recommendations.  5/21 left ankle mri wwo contrast 1. Mild subcutaneous fat edema and swelling of the dorsal midfoot and lateral ankle. No rim enhancing abscess is seen. 2. Mild posterior tibial tenosynovitis. MRI cannot determine the presence or absence of infection within the tenosynovitis fluid, however no complicating features are seen to specifically suggest infectious tenosynovitis. 3. Mild partial-thickness longitudinal tear of the peroneus brevis tendon starting at the level of the distal fibular epiphysis and involving an approximate 1.5 cm length of the tendon. Minimal peroneus longus and brevis tenosynovitis. 4. Mild marrow edema within the posterior aspect of the medial malleolus, likely stress related/reactive. 5. Mild thinning of the anterior aspect of the mid transverse dimension of the talar dome cartilage with mild subchondral increased T2 signal. No tibiotalar joint effusion.   5/21 mri left hip wwo  contrast Compared to 02/03/2024:   1. Persistent high-grade marrow edema and enhancement throughout the left femoral head and neck extending into the greater trochanter. There is moderate erosion of the superior left femoral head which appears mildly worsened from 02/03/2024 MRI. There is a moderate left femoroacetabular joint effusion with moderate synovial thickening, similar to prior. Findings are consistent with left hip septic arthritis and mildly worsening proximal left femoral osteomyelitis. 2. There is linear decreased T1 and increased T2 signal suspicious for a pathologic fracture from the osteomyelitis weakened bone. This nondisplaced fracture extends through the mid to posterior aspect of the proximal left femoral neck to the posterior cortex. In retrospect, the early start of an acute fracture was likely present on 02/03/2024, however this is worsened from prior. 3. There is complex, rim enhancing fluid that is new  from prior bordering the anterior aspect of the left femoroacetabular joint. This is concerning for a rim enhancing abscess versus infected ganglion extending anteriorly from the inferior femoroacetabular joint space measuring up to 3.5 cm, new from prior. 4. The prior lobular walled-off abscesses within the deep aspect of the left gluteus minimus and medius muscles on 02/03/2024 are no longer seen. The previously seen abscess at the lateral border of the proximal rectus femoris muscle on 02/03/2024 MRI is no longer visualized. 5. There is moderate edema throughout the left adductor musculature, slightly improved from 02/03/2024. There is again moderate fluid deep to the left iliacus muscle, as on 02/03/2024.    Jamesetta Mcbride, MD [facility] [phone] pager    02/22/2024, 1:14 PM
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
## 2024-02-23 ENCOUNTER — Inpatient Hospital Stay (HOSPITAL_COMMUNITY): Payer: MEDICAID | Admitting: Certified Registered"

## 2024-02-23 ENCOUNTER — Inpatient Hospital Stay (HOSPITAL_COMMUNITY): Payer: MEDICAID

## 2024-02-23 ENCOUNTER — Other Ambulatory Visit: Payer: Self-pay

## 2024-02-23 ENCOUNTER — Encounter (HOSPITAL_COMMUNITY): Payer: Self-pay | Admitting: Internal Medicine

## 2024-02-23 ENCOUNTER — Encounter (HOSPITAL_COMMUNITY)
Admission: EM | Disposition: A | Discharge status: Home / Self Care | Payer: Self-pay | Source: Home / Self Care | Attending: Internal Medicine

## 2024-02-23 DIAGNOSIS — M869 Osteomyelitis, unspecified: Secondary | ICD-10-CM | POA: Diagnosis not present

## 2024-02-23 DIAGNOSIS — L03116 Cellulitis of left lower limb: Secondary | ICD-10-CM | POA: Diagnosis not present

## 2024-02-23 DIAGNOSIS — M009 Pyogenic arthritis, unspecified: Secondary | ICD-10-CM

## 2024-02-23 DIAGNOSIS — I10 Essential (primary) hypertension: Secondary | ICD-10-CM | POA: Diagnosis not present

## 2024-02-23 DIAGNOSIS — F17210 Nicotine dependence, cigarettes, uncomplicated: Secondary | ICD-10-CM | POA: Diagnosis not present

## 2024-02-23 HISTORY — PX: TOTAL HIP ARTHROPLASTY: SHX124

## 2024-02-23 LAB — COMPREHENSIVE METABOLIC PANEL WITH GFR
ALT: 27 U/L (ref 0–44)
AST: 48 U/L — ABNORMAL HIGH (ref 15–41)
Albumin: 3.3 g/dL — ABNORMAL LOW (ref 3.5–5.0)
Alkaline Phosphatase: 100 U/L (ref 38–126)
Anion gap: 7 (ref 5–15)
BUN: 7 mg/dL (ref 6–20)
CO2: 28 mmol/L (ref 22–32)
Calcium: 9.2 mg/dL (ref 8.9–10.3)
Chloride: 102 mmol/L (ref 98–111)
Creatinine, Ser: 0.51 mg/dL — ABNORMAL LOW (ref 0.61–1.24)
GFR, Estimated: >60 mL/min (ref >60)
Glucose, Bld: 99 mg/dL (ref 70–99)
Potassium: 3.6 mmol/L (ref 3.5–5.1)
Sodium: 137 mmol/L (ref 135–145)
Total Bilirubin: 0.3 mg/dL (ref 0.0–1.2)
Total Protein: 7.4 g/dL (ref 6.5–8.1)

## 2024-02-23 LAB — CBC
HCT: 31.7 % — ABNORMAL LOW (ref 39.0–52.0)
Hemoglobin: 10.5 g/dL — ABNORMAL LOW (ref 13.0–17.0)
MCH: 29.7 pg (ref 26.0–34.0)
MCHC: 33.1 g/dL (ref 30.0–36.0)
MCV: 89.5 fL (ref 80.0–100.0)
Platelets: 306 10*3/uL (ref 150–400)
RBC: 3.54 MIL/uL — ABNORMAL LOW (ref 4.22–5.81)
RDW: 14.8 % (ref 11.5–15.5)
WBC: 4.2 10*3/uL (ref 4.0–10.5)
nRBC: 0 % (ref 0.0–0.2)

## 2024-02-23 LAB — SURGICAL PCR SCREEN
MRSA, PCR: NEGATIVE
Staphylococcus aureus: NEGATIVE

## 2024-02-23 LAB — MAGNESIUM: Magnesium: 1.8 mg/dL (ref 1.7–2.4)

## 2024-02-23 SURGERY — ARTHROPLASTY, HIP, TOTAL,POSTERIOR APPROACH
Anesthesia: General | Site: Hip | Laterality: Left

## 2024-02-23 MED ORDER — BUPIVACAINE-MELOXICAM ER 400-12 MG/14ML IJ SOLN
INTRAMUSCULAR | Status: AC
  Filled 2024-02-23: qty 1

## 2024-02-23 MED ORDER — OXYCODONE HCL 5 MG/5ML PO SOLN: 5.0000 mg | Freq: Once | ORAL | Status: DC | PRN

## 2024-02-23 MED ORDER — FENTANYL CITRATE (PF) 250 MCG/5ML IJ SOLN
INTRAMUSCULAR | Status: AC
  Filled 2024-02-23: qty 5

## 2024-02-23 MED ORDER — ACETAMINOPHEN 500 MG PO TABS
500.0000 mg | ORAL_TABLET | Freq: Four times a day (QID) | ORAL | Status: AC
  Administered 2024-02-23 – 2024-02-24 (×4): 500 mg via ORAL
  Filled 2024-02-23 (×4): qty 1

## 2024-02-23 MED ORDER — TRANEXAMIC ACID-NACL 1000-0.7 MG/100ML-% IV SOLN
1000.0000 mg | Freq: Once | INTRAVENOUS | Status: AC
  Administered 2024-02-23: 1000 mg via INTRAVENOUS
  Filled 2024-02-23: qty 100

## 2024-02-23 MED ORDER — IRRISEPT - 450ML BOTTLE WITH 0.05% CHG IN STERILE WATER, USP 99.95% OPTIME
TOPICAL | Status: DC | PRN
  Administered 2024-02-23: 450 mL

## 2024-02-23 MED ORDER — ORAL CARE MOUTH RINSE: 15.0000 mL | Freq: Once | OROMUCOSAL | Status: AC

## 2024-02-23 MED ORDER — VANCOMYCIN HCL 1000 MG IV SOLR
INTRAVENOUS | Status: AC
  Filled 2024-02-23: qty 20

## 2024-02-23 MED ORDER — LIDOCAINE 2% (20 MG/ML) 5 ML SYRINGE
INTRAMUSCULAR | Status: AC
  Filled 2024-02-23: qty 5

## 2024-02-23 MED ORDER — MAGNESIUM CITRATE PO SOLN: 1.0000 | Freq: Once | ORAL | Status: DC | PRN

## 2024-02-23 MED ORDER — FENTANYL CITRATE (PF) 100 MCG/2ML IJ SOLN
INTRAMUSCULAR | Status: AC
  Filled 2024-02-23: qty 2

## 2024-02-23 MED ORDER — 0.9 % SODIUM CHLORIDE (POUR BTL) OPTIME
TOPICAL | Status: DC | PRN
  Administered 2024-02-23: 1000 mL

## 2024-02-23 MED ORDER — ACETAMINOPHEN 500 MG PO TABS
ORAL_TABLET | ORAL | Status: AC
  Filled 2024-02-23: qty 2

## 2024-02-23 MED ORDER — GABAPENTIN 300 MG PO CAPS
ORAL_CAPSULE | ORAL | Status: AC
Start: 2024-02-23 — End: 2024-02-24
  Filled 2024-02-23: qty 1

## 2024-02-23 MED ORDER — VANCOMYCIN HCL 1000 MG IV SOLR
INTRAVENOUS | Status: DC | PRN
  Administered 2024-02-23: 4000 mg via TOPICAL

## 2024-02-23 MED ORDER — DEXMEDETOMIDINE HCL IN NACL 80 MCG/20ML IV SOLN
INTRAVENOUS | Status: DC | PRN
  Administered 2024-02-23: 12 ug via INTRAVENOUS
  Administered 2024-02-23: 8 ug via INTRAVENOUS

## 2024-02-23 MED ORDER — PROPOFOL 10 MG/ML IV BOLUS
INTRAVENOUS | Status: AC
  Filled 2024-02-23: qty 20

## 2024-02-23 MED ORDER — MIDAZOLAM HCL 2 MG/2ML IJ SOLN
INTRAMUSCULAR | Status: DC | PRN
Start: 2024-02-23 — End: 2024-02-23
  Administered 2024-02-23: 2 mg via INTRAVENOUS

## 2024-02-23 MED ORDER — CELECOXIB 200 MG PO CAPS
ORAL_CAPSULE | ORAL | Status: AC
  Filled 2024-02-23: qty 1

## 2024-02-23 MED ORDER — BUPIVACAINE-EPINEPHRINE (PF) 0.25% -1:200000 IJ SOLN
INTRAMUSCULAR | Status: AC
  Filled 2024-02-23: qty 30

## 2024-02-23 MED ORDER — ONDANSETRON HCL 4 MG/2ML IJ SOLN
4.0000 mg | Freq: Four times a day (QID) | INTRAMUSCULAR | Status: DC | PRN
  Administered 2024-02-24 – 2024-03-08 (×2): 4 mg via INTRAVENOUS
  Filled 2024-02-23 (×2): qty 2

## 2024-02-23 MED ORDER — HYDROMORPHONE HCL 1 MG/ML IJ SOLN
INTRAMUSCULAR | Status: AC
  Filled 2024-02-23: qty 0.5

## 2024-02-23 MED ORDER — POLYETHYLENE GLYCOL 3350 17 G PO PACK
17.0000 g | PACK | Freq: Every day | ORAL | Status: DC | PRN
  Administered 2024-02-24 – 2024-03-03 (×2): 17 g via ORAL
  Filled 2024-02-23 (×2): qty 1

## 2024-02-23 MED ORDER — HYDROCODONE-ACETAMINOPHEN 5-325 MG PO TABS
1.0000 | ORAL_TABLET | ORAL | Status: DC | PRN
  Administered 2024-02-24 – 2024-03-08 (×12): 2 via ORAL
  Filled 2024-02-23 (×12): qty 2

## 2024-02-23 MED ORDER — SODIUM CHLORIDE 0.9 % IR SOLN
Status: DC | PRN
  Administered 2024-02-23: 1000 mL
  Administered 2024-02-23: 3000 mL

## 2024-02-23 MED ORDER — PHENYLEPHRINE 80 MCG/ML (10ML) SYRINGE FOR IV PUSH (FOR BLOOD PRESSURE SUPPORT)
PREFILLED_SYRINGE | INTRAVENOUS | Status: AC
  Filled 2024-02-23: qty 10

## 2024-02-23 MED ORDER — CHLORHEXIDINE GLUCONATE 0.12 % MT SOLN
OROMUCOSAL | Status: AC
  Filled 2024-02-23: qty 15

## 2024-02-23 MED ORDER — LACTATED RINGERS IV SOLN: INTRAVENOUS | Status: DC

## 2024-02-23 MED ORDER — METHOCARBAMOL 1000 MG/10ML IJ SOLN
500.0000 mg | Freq: Four times a day (QID) | INTRAMUSCULAR | Status: DC | PRN
  Administered 2024-03-09: 500 mg via INTRAVENOUS
  Filled 2024-02-23: qty 10

## 2024-02-23 MED ORDER — GABAPENTIN 300 MG PO CAPS
300.0000 mg | ORAL_CAPSULE | ORAL | Status: AC
  Administered 2024-02-23: 300 mg via ORAL

## 2024-02-23 MED ORDER — MEPERIDINE HCL 25 MG/ML IJ SOLN: 6.2500 mg | INTRAMUSCULAR | Status: DC | PRN

## 2024-02-23 MED ORDER — PHENYLEPHRINE 80 MCG/ML (10ML) SYRINGE FOR IV PUSH (FOR BLOOD PRESSURE SUPPORT)
PREFILLED_SYRINGE | INTRAVENOUS | Status: DC | PRN
  Administered 2024-02-23 (×2): 80 ug via INTRAVENOUS

## 2024-02-23 MED ORDER — METOCLOPRAMIDE HCL 5 MG PO TABS: 5.0000 mg | ORAL_TABLET | Freq: Three times a day (TID) | ORAL | Status: DC | PRN

## 2024-02-23 MED ORDER — PRONTOSAN WOUND IRRIGATION OPTIME
TOPICAL | Status: DC | PRN
Start: 2024-02-23 — End: 2024-02-23
  Administered 2024-02-23: 1

## 2024-02-23 MED ORDER — METHOCARBAMOL 500 MG PO TABS
500.0000 mg | ORAL_TABLET | Freq: Four times a day (QID) | ORAL | Status: DC | PRN
  Administered 2024-03-04 – 2024-03-24 (×44): 500 mg via ORAL
  Filled 2024-02-23 (×51): qty 1

## 2024-02-23 MED ORDER — FENTANYL CITRATE (PF) 100 MCG/2ML IJ SOLN
25.0000 ug | INTRAMUSCULAR | Status: DC | PRN
  Administered 2024-02-23 (×2): 50 ug via INTRAVENOUS

## 2024-02-23 MED ORDER — EPHEDRINE 5 MG/ML INJ
INTRAVENOUS | Status: AC
  Filled 2024-02-23: qty 5

## 2024-02-23 MED ORDER — MIDAZOLAM HCL 2 MG/2ML IJ SOLN
INTRAMUSCULAR | Status: AC
  Filled 2024-02-23: qty 2

## 2024-02-23 MED ORDER — FENTANYL CITRATE (PF) 250 MCG/5ML IJ SOLN
INTRAMUSCULAR | Status: DC | PRN
  Administered 2024-02-23 (×2): 100 ug via INTRAVENOUS
  Administered 2024-02-23: 50 ug via INTRAVENOUS

## 2024-02-23 MED ORDER — METOCLOPRAMIDE HCL 5 MG/ML IJ SOLN: 5.0000 mg | Freq: Three times a day (TID) | INTRAMUSCULAR | Status: DC | PRN

## 2024-02-23 MED ORDER — ACETAMINOPHEN 500 MG PO TABS
1000.0000 mg | ORAL_TABLET | Freq: Once | ORAL | Status: AC
  Administered 2024-02-23: 1000 mg via ORAL

## 2024-02-23 MED ORDER — CELECOXIB 200 MG PO CAPS
200.0000 mg | ORAL_CAPSULE | Freq: Once | ORAL | Status: AC
  Administered 2024-02-23: 200 mg via ORAL

## 2024-02-23 MED ORDER — ONDANSETRON HCL 4 MG/2ML IJ SOLN
INTRAMUSCULAR | Status: DC | PRN
  Administered 2024-02-23: 4 mg via INTRAVENOUS

## 2024-02-23 MED ORDER — TRANEXAMIC ACID 1000 MG/10ML IV SOLN
2000.0000 mg | Freq: Once | INTRAVENOUS | Status: AC
  Administered 2024-02-23: 2000 mg via TOPICAL
  Filled 2024-02-23 (×2): qty 20

## 2024-02-23 MED ORDER — ACETAMINOPHEN 325 MG PO TABS: 325.0000 mg | ORAL_TABLET | Freq: Four times a day (QID) | ORAL | Status: DC | PRN

## 2024-02-23 MED ORDER — ALBUMIN HUMAN 5 % IV SOLN: INTRAVENOUS | Status: DC | PRN

## 2024-02-23 MED ORDER — OXYCODONE HCL 5 MG PO TABS: 5.0000 mg | ORAL_TABLET | Freq: Once | ORAL | Status: DC | PRN

## 2024-02-23 MED ORDER — KETOROLAC TROMETHAMINE 15 MG/ML IJ SOLN
15.0000 mg | Freq: Four times a day (QID) | INTRAMUSCULAR | Status: AC
  Administered 2024-02-23 – 2024-02-24 (×4): 15 mg via INTRAVENOUS
  Filled 2024-02-23 (×4): qty 1

## 2024-02-23 MED ORDER — HYDROMORPHONE HCL 1 MG/ML IJ SOLN
INTRAMUSCULAR | Status: DC | PRN
  Administered 2024-02-23: .5 mg via INTRAVENOUS

## 2024-02-23 MED ORDER — ASPIRIN 81 MG PO TBEC
81.0000 mg | DELAYED_RELEASE_TABLET | Freq: Two times a day (BID) | ORAL | Status: DC
  Administered 2024-02-23 – 2024-03-25 (×58): 81 mg via ORAL
  Filled 2024-02-23 (×62): qty 1

## 2024-02-23 MED ORDER — ROCURONIUM BROMIDE 10 MG/ML (PF) SYRINGE
PREFILLED_SYRINGE | INTRAVENOUS | Status: DC | PRN
Start: 2024-02-23 — End: 2024-02-23
  Administered 2024-02-23: 30 mg via INTRAVENOUS
  Administered 2024-02-23: 70 mg via INTRAVENOUS

## 2024-02-23 MED ORDER — SUGAMMADEX SODIUM 200 MG/2ML IV SOLN
INTRAVENOUS | Status: DC | PRN
  Administered 2024-02-23: 150 mg via INTRAVENOUS

## 2024-02-23 MED ORDER — DEXAMETHASONE SODIUM PHOSPHATE 10 MG/ML IJ SOLN
INTRAMUSCULAR | Status: DC | PRN
  Administered 2024-02-23: 10 mg via INTRAVENOUS

## 2024-02-23 MED ORDER — DIPHENHYDRAMINE HCL 12.5 MG/5ML PO ELIX: 25.0000 mg | ORAL_SOLUTION | ORAL | Status: DC | PRN

## 2024-02-23 MED ORDER — MORPHINE SULFATE (PF) 2 MG/ML IV SOLN
0.5000 mg | INTRAVENOUS | Status: DC | PRN
  Administered 2024-02-24 – 2024-02-25 (×6): 1 mg via INTRAVENOUS
  Administered 2024-02-26: 0.5 mg via INTRAVENOUS
  Administered 2024-02-26 – 2024-03-02 (×15): 1 mg via INTRAVENOUS
  Administered 2024-03-02 (×3): 0.5 mg via INTRAVENOUS
  Administered 2024-03-03 (×2): 1 mg via INTRAVENOUS
  Administered 2024-03-03: 0.5 mg via INTRAVENOUS
  Administered 2024-03-03 – 2024-03-09 (×24): 1 mg via INTRAVENOUS
  Filled 2024-02-23 (×53): qty 1

## 2024-02-23 MED ORDER — ONDANSETRON HCL 4 MG/2ML IJ SOLN: 4.0000 mg | Freq: Once | INTRAMUSCULAR | Status: DC | PRN

## 2024-02-23 MED ORDER — DEXAMETHASONE SODIUM PHOSPHATE 10 MG/ML IJ SOLN
INTRAMUSCULAR | Status: AC
  Filled 2024-02-23: qty 1

## 2024-02-23 MED ORDER — PROPOFOL 10 MG/ML IV BOLUS
INTRAVENOUS | Status: DC | PRN
  Administered 2024-02-23: 120 mg via INTRAVENOUS

## 2024-02-23 MED ORDER — DOCUSATE SODIUM 100 MG PO CAPS
100.0000 mg | ORAL_CAPSULE | Freq: Two times a day (BID) | ORAL | Status: DC
  Administered 2024-02-24 – 2024-03-25 (×44): 100 mg via ORAL
  Filled 2024-02-23 (×57): qty 1

## 2024-02-23 MED ORDER — SORBITOL 70 % SOLN
30.0000 mL | Freq: Every day | Status: DC | PRN
  Administered 2024-02-28: 30 mL via ORAL
  Filled 2024-02-23 (×2): qty 30

## 2024-02-23 MED ORDER — ROCURONIUM BROMIDE 10 MG/ML (PF) SYRINGE
PREFILLED_SYRINGE | INTRAVENOUS | Status: AC
  Filled 2024-02-23: qty 10

## 2024-02-23 MED ORDER — CHLORHEXIDINE GLUCONATE 0.12 % MT SOLN
15.0000 mL | Freq: Once | OROMUCOSAL | Status: AC
  Administered 2024-02-23: 15 mL via OROMUCOSAL

## 2024-02-23 MED ORDER — HYDROCODONE-ACETAMINOPHEN 7.5-325 MG PO TABS
1.0000 | ORAL_TABLET | ORAL | Status: DC | PRN
  Administered 2024-02-24 – 2024-02-27 (×3): 2 via ORAL
  Administered 2024-02-27: 1 via ORAL
  Administered 2024-02-27 – 2024-02-28 (×5): 2 via ORAL
  Administered 2024-02-28: 1 via ORAL
  Administered 2024-02-29 – 2024-03-04 (×21): 2 via ORAL
  Administered 2024-03-04 – 2024-03-05 (×2): 1 via ORAL
  Administered 2024-03-05 (×2): 2 via ORAL
  Administered 2024-03-05: 1 via ORAL
  Administered 2024-03-06 – 2024-03-07 (×6): 2 via ORAL
  Administered 2024-03-07: 1 via ORAL
  Administered 2024-03-07 – 2024-03-10 (×13): 2 via ORAL
  Filled 2024-02-23: qty 2
  Filled 2024-02-23: qty 1
  Filled 2024-02-23 (×42): qty 2
  Filled 2024-02-23: qty 1
  Filled 2024-02-23 (×12): qty 2

## 2024-02-23 MED ORDER — VANCOMYCIN HCL 1000 MG IV SOLR
INTRAVENOUS | Status: AC
  Filled 2024-02-23: qty 60

## 2024-02-23 MED ORDER — BUPIVACAINE-MELOXICAM ER 200-6 MG/7ML IJ SOLN
INTRAMUSCULAR | Status: AC
  Filled 2024-02-23: qty 1

## 2024-02-23 MED ORDER — ONDANSETRON HCL 4 MG/2ML IJ SOLN
INTRAMUSCULAR | Status: AC
  Filled 2024-02-23: qty 2

## 2024-02-23 MED ORDER — LIDOCAINE HCL (PF) 2 % IJ SOLN
INTRAMUSCULAR | Status: DC | PRN
  Administered 2024-02-23: 80 mg via INTRADERMAL

## 2024-02-23 MED ORDER — ONDANSETRON HCL 4 MG PO TABS
4.0000 mg | ORAL_TABLET | Freq: Four times a day (QID) | ORAL | Status: DC | PRN
  Administered 2024-02-29: 4 mg via ORAL
  Filled 2024-02-23: qty 1

## 2024-02-23 SURGICAL SUPPLY — 76 items
BAG COUNTER SPONGE SURGICOUNT (BAG) ×1 IMPLANT
BAG DECANTER FOR FLEXI CONT (MISCELLANEOUS) IMPLANT
BLADE CLIPPER SURG (BLADE) ×1 IMPLANT
BLADE SAW THK.89X75X18XSGTL (BLADE) IMPLANT
BRUSH FEMORAL CANAL (MISCELLANEOUS) IMPLANT
CANISTER WOUNDNEG PRESSURE 500 (CANNISTER) IMPLANT
CEMENT BONE SIMPLEX SPEEDSET (Cement) IMPLANT
CNTNR URN SCR LID CUP LEK RST (MISCELLANEOUS) IMPLANT
COVER SURGICAL LIGHT HANDLE (MISCELLANEOUS) ×1 IMPLANT
DRAPE DERMATAC (DRAPES) IMPLANT
DRAPE HALF SHEET 40X57 (DRAPES) ×2 IMPLANT
DRAPE HIP W/POCKET STRL (MISCELLANEOUS) ×1 IMPLANT
DRAPE INCISE IOBAN 66X45 STRL (DRAPES) ×1 IMPLANT
DRAPE INCISE IOBAN 85X60 (DRAPES) IMPLANT
DRAPE SURG ORHT 6 SPLT 77X108 (DRAPES) ×2 IMPLANT
DRAPE U-SHAPE 47X51 STRL (DRAPES) ×1 IMPLANT
DRESSING PEEL AND PLC PRVNA 13 (GAUZE/BANDAGES/DRESSINGS) IMPLANT
DRSG AQUACEL AG ADV 3.5X10 (GAUZE/BANDAGES/DRESSINGS) IMPLANT
DRSG AQUACEL AG ADV 3.5X14 (GAUZE/BANDAGES/DRESSINGS) IMPLANT
DURAPREP 26ML APPLICATOR (WOUND CARE) ×3 IMPLANT
ELECT BLADE 6.5 EXT (BLADE) IMPLANT
ELECT CAUTERY BLADE 6.4 (BLADE) IMPLANT
ELECT PENCIL ROCKER SW 15FT (MISCELLANEOUS) IMPLANT
ELECTRODE BLDE 4.0 EZ CLN MEGD (MISCELLANEOUS) IMPLANT
ELECTRODE REM PT RTRN 9FT ADLT (ELECTROSURGICAL) ×1 IMPLANT
EVACUATOR 1/8 PVC DRAIN (DRAIN) IMPLANT
FILTER STRAW FLUID ASPIR (MISCELLANEOUS) ×1 IMPLANT
GLOVE BIOGEL PI IND STRL 7.0 (GLOVE) ×2 IMPLANT
GLOVE BIOGEL PI IND STRL 7.5 (GLOVE) ×3 IMPLANT
GLOVE ECLIPSE 6.5 STRL STRAW (GLOVE) ×2 IMPLANT
GLOVE SKINSENSE STRL SZ7.5 (GLOVE) ×1 IMPLANT
GLOVE SURG SYN 7.5 E (GLOVE) ×2 IMPLANT
GLOVE SURG SYN 7.5 PF PI (GLOVE) ×2 IMPLANT
GLOVE SURG UNDER POLY LF SZ7 (GLOVE) ×19 IMPLANT
GLOVE SURG UNDER POLY LF SZ7.5 (GLOVE) ×2 IMPLANT
GOWN TOGA ZIPPER T7+ PEEL AWAY (MISCELLANEOUS) ×2 IMPLANT
HEAD FEM CMT REMEDY SM 46 (Head) IMPLANT
HOOD PEEL AWAY T7 (MISCELLANEOUS) ×1 IMPLANT
KIT BASIN OR (CUSTOM PROCEDURE TRAY) ×1 IMPLANT
KIT TURNOVER KIT B (KITS) ×1 IMPLANT
LAVAGE JET IRRISEPT WOUND (IRRIGATION / IRRIGATOR) IMPLANT
MANIFOLD NEPTUNE II (INSTRUMENTS) ×1 IMPLANT
MARKER SKIN DUAL TIP RULER LAB (MISCELLANEOUS) IMPLANT
NDL SPNL 18GX3.5 QUINCKE PK (NEEDLE) ×1 IMPLANT
NEEDLE SPNL 18GX3.5 QUINCKE PK (NEEDLE) ×1 IMPLANT
NS IRRIG 1000ML POUR BTL (IV SOLUTION) ×1 IMPLANT
PACK TOTAL JOINT (CUSTOM PROCEDURE TRAY) ×1 IMPLANT
PAD ARMBOARD POSITIONER FOAM (MISCELLANEOUS) ×2 IMPLANT
PASSER SUT SWANSON 36MM LOOP (INSTRUMENTS) ×1 IMPLANT
PILLOW ABDUCTION MEDIUM (MISCELLANEOUS) IMPLANT
RETRIEVER SUT HEWSON (MISCELLANEOUS) IMPLANT
SET HNDPC FAN SPRY TIP SCT (DISPOSABLE) IMPLANT
SOLUTION PRONTOSAN WOUND 350ML (IRRIGATION / IRRIGATOR) IMPLANT
SPONGE T-LAP 18X18 ~~LOC~~+RFID (SPONGE) IMPLANT
STAPLER VISISTAT 35W (STAPLE) IMPLANT
STEM FEM REMEDY SM 227 (Stem) IMPLANT
SUT ETHIBOND NAB CT1 #1 30IN (SUTURE) ×1 IMPLANT
SUT ETHILON 2 0 PSLX (SUTURE) IMPLANT
SUT MNCRL AB 3-0 PS2 27 (SUTURE) IMPLANT
SUT MON AB 5-0 PS2 18 (SUTURE) IMPLANT
SUT PDS AB 0 CT 36 (SUTURE) ×1 IMPLANT
SUT PDS AB 1 CT 36 (SUTURE) ×1 IMPLANT
SUT STRATAFIX 1PDS 45CM VIOLET (SUTURE) IMPLANT
SUT VIC AB 1 CT1 27XBRD ANTBC (SUTURE) ×2 IMPLANT
SUT VIC AB 2-0 CT1 TAPERPNT 27 (SUTURE) ×2 IMPLANT
SWAB COLLECTION DEVICE MRSA (MISCELLANEOUS) IMPLANT
SWAB CULTURE ESWAB REG 1ML (MISCELLANEOUS) IMPLANT
SYR 30ML LL (SYRINGE) IMPLANT
SYR 50ML LL SCALE MARK (SYRINGE) ×1 IMPLANT
SYR TB 1ML LUER SLIP (SYRINGE) ×1 IMPLANT
TOWEL GREEN STERILE (TOWEL DISPOSABLE) ×1 IMPLANT
TOWEL GREEN STERILE FF (TOWEL DISPOSABLE) ×1 IMPLANT
TOWER CARTRIDGE SMART MIX (DISPOSABLE) IMPLANT
TRAY FOL W/BAG SLVR 16FR STRL (SET/KITS/TRAYS/PACK) IMPLANT
TUBE SUCT ARGYLE STRL (TUBING) ×1 IMPLANT
WATER STERILE IRR 1000ML POUR (IV SOLUTION) ×3 IMPLANT

## 2024-02-23 NOTE — Op Note (Signed)
 ARTHROPLASTY, HIP, TOTAL,POSTERIOR APPROACH  Procedure Note Mark Hammond   [id number]  Pre-op Diagnosis: left hip osteomyelitis, septic arthritis     Post-op Diagnosis: same   Operative Procedures  Resection of left femoral head, placement of antibiotic hip spacer Incision and drainage of left hip abscess and septic arthritis Application of incisional VAC left hip  Personnel  Surgeon(s): Wes Hamman, MD  Assistant: April Green, RNFA   Anesthesia: general  Prosthesis:  Femur: Osteoremedy hip spacer, size small, 46 mm monopolar head  Date of Service: 02/23/2024  Indication: 33 y.o. year old male with a history of left hip septic arthritis and chronic osteomyelitis that has failed conservative management. After risk and benefits a resection arthroplasty were explained, the patient elected to proceed with after voicing understanding.  Procedure:  After informed consent was obtained and understanding of the risk were voiced including but not limited to bleeding, infection, damage to surrounding structures including nerves and vessels, blood clots, leg length inequality, dislocation and the failure to achieve desired results, the operative extremity was marked with verbal confirmation of the patient in the holding area.   The patient was then brought to the operating room and transported to the operating room table and placed in the lateral decubitus position on a peg board with all bony prominences well padded.  The operative limb was then prepped and draped in the usual sterile fashion and preoperative antibiotics were administered.  A time out was performed prior to the start of surgery confirming the correct extremity, preoperative antibiotic administration, as well as team members, implants and instruments available for the case. Correct surgical site was also confirmed with preoperative radiographs. A standard posterior approach to the hip was performed.  The IT band was incised in  line with the incision.  The underlying trochanteric bursa was chronically inflamed consistent with chronic infection.  The bursa was excised sharply.  The posterior hip structures were distorted due to chronic infection.  The piriformis and the short external rotators were unrecognizable.  They were excised along with the posterior capsule due to chronic infection.  The hip was dislocated and the femoral neck cut was made.  The intra-articular fluid and synovitis were cultured.  There was significant remodeling and destruction of the femoral head.  The acetabulum was exposed and labrum resected.  The anterior acetabular abscess that corresponded to the preoperative MRI was decompressed with a tonsil.  Sharp excisional debridement was performed circumferentially around the acetabulum as well as within the acetabulum with a combination of rongeurs and curette.  The medial wall was deficient therefore I decided not to use an acetabular reamer.   The acetabulum was debrided down to bleeding bone.  We then turned our attention to femoral debridement.  A box osteotome followed by a canal finder were used to gain entry into the canal.  A lateralizer was used to ream and debride the femoral canal and the proximal femur.  We then broached up to a size 3.  This corresponded to a size small spacer stem.  The trial was inserted and the hip was reduced and taken through range of motion.  Leg lengths are equal clinically.  Hip was stable to 90 degrees of flexion and 45 degrees of internal rotation.  The hip was then dislocated and the trial spacer was removed.  We then thoroughly irrigated both the femur and the acetabulum with normal saline.  The spacer was then assembled on the back table and the cement was  vacuum mixed.  The cement was placed around the neck of the spacer and around the calcar to help maintain version.  Once the cement had hardened we reduced the hip without any difficulty.  Again the hip was thoroughly  irrigated.  4 g of vancomycin  powder was placed in the joint deep to the fascia.  The fascia was closed with a running #1 strata fix suture.  Subcutaneous tissue was then irrigated with Irrisept.  2 g of vancomycin  powder was placed in the subcutaneous space.  Usual layered closure was performed with 2-0 Monocryl and staples.  Incisional VAC was placed on top.  Sterile dressings were applied.  Abduction pillow was placed.  Patient tolerated the procedure well had no immediate complications. All sponge, needle, and instrument counts were correct at the end of the case.  Position: lateral decubitus   Complications: none.  Time Out: performed   Drains/Packing: iVAC  Estimated blood loss: 800 cc  Returned to Recovery Room: in good condition.   Antibiotics: yes   Mechanical VTE (DVT) Prophylaxis: sequential compression devices, TED thigh-high  Chemical VTE (DVT) Prophylaxis: aspirin  Fluid Replacement  Crystalloid: see record Blood: none  FFP: none   Specimens Removed: 1 to pathology   Sponge and Instrument Count Correct? yes   PACU: portable radiograph - low AP pelvis  Admission: inpatient status, start PT & OT POD#1  Plan/RTC: Return in 2 weeks for wound check.  Weight Bearing/Load Lower Extremity: touchdown weight bearing Posterior hip precautions  N. Claria Crofts, MD [facility] 5:28 PM

## 2024-02-23 NOTE — Anesthesia Procedure Notes (Signed)
 Procedure Name: Intubation Date/Time: 02/23/2024 2:01 PM  Performed by: Bennett Brass, CRNAPre-anesthesia Checklist: Patient identified, Emergency Drugs available, Suction available and Patient being monitored Patient Re-evaluated:Patient Re-evaluated prior to induction Oxygen Delivery Method: Circle system utilized Preoxygenation: Pre-oxygenation with 100% oxygen Induction Type: IV induction Ventilation: Mask ventilation without difficulty Laryngoscope Size: Miller and 2 Grade View: Grade I Tube type: Oral Tube size: 7.0 mm Number of attempts: 1 Airway Equipment and Method: Stylet and Oral airway Placement Confirmation: ETT inserted through vocal cords under direct vision, positive ETCO2 and breath sounds checked- equal and bilateral Secured at: 22 cm Tube secured with: Tape Dental Injury: Teeth and Oropharynx as per pre-operative assessment  Comments: Pt unable to lay on back. Induction on right side, Dr. Barbaraann Levo intubated x 1

## 2024-02-23 NOTE — H&P (Signed)

## 2024-02-23 NOTE — Anesthesia Preprocedure Evaluation (Addendum)
 Anesthesia Evaluation  Patient identified by MRN, date of birth, ID band Patient awake    Reviewed: Allergy & Precautions, H&P , NPO status , Patient's Chart, lab work & pertinent test results  Airway Mallampati: II  TM Distance: >3 FB Neck ROM: Full    Dental no notable dental hx.    Pulmonary neg pulmonary ROS, Current Smoker   Pulmonary exam normal breath sounds clear to auscultation       Cardiovascular Exercise Tolerance: Good hypertension, negative cardio ROS Normal cardiovascular exam Rhythm:Regular Rate:Normal  ECHO 5/25   1. Left ventricular ejection fraction, by estimation, is 60 to 65%. The  left ventricle has normal function. The left ventricle has no regional  wall motion abnormalities. Left ventricular diastolic parameters were  normal.   2. Right ventricular systolic function is normal. The right ventricular  size is normal. Tricuspid regurgitation signal is inadequate for assessing  PA pressure.   3. The mitral valve is grossly normal. No evidence of mitral valve  regurgitation. No evidence of mitral stenosis.   4. The aortic valve was not well visualized. There is mild calcification  of the aortic valve. There is mild thickening of the aortic valve. Aortic  valve regurgitation is not visualized. No aortic stenosis is present.   5. The inferior vena cava is normal in size with greater than 50%  respiratory variability, suggesting right atrial pressure of 3 mmHg.     Neuro/Psych  PSYCHIATRIC DISORDERS Anxiety Depression Bipolar Disorder Schizophrenia  negative neurological ROS  negative psych ROS   GI/Hepatic negative GI ROS, Neg liver ROS,,,(+)     substance abuse  alcohol use and marijuana use, Hepatitis -, C  Endo/Other  negative endocrine ROS    Renal/GU negative Renal ROS  negative genitourinary   Musculoskeletal negative musculoskeletal ROS (+)  narcotic dependent  Abdominal    Peds negative pediatric ROS (+)  Hematology negative hematology ROS (+) Blood dyscrasia, anemia   Anesthesia Other Findings   Reproductive/Obstetrics negative OB ROS                             Anesthesia Physical Anesthesia Plan  ASA: 3  Anesthesia Plan: General   Post-op Pain Management: Minimal or no pain anticipated, Tylenol  PO (pre-op)*, Celebrex PO (pre-op)* and Dilaudid  IV   Induction: Intravenous  PONV Risk Score and Plan: 1 and Ondansetron  and Treatment may vary due to age or medical condition  Airway Management Planned: Oral ETT and LMA  Additional Equipment: None  Intra-op Plan:   Post-operative Plan: Extubation in OR  Informed Consent: I have reviewed the patients History and Physical, chart, labs and discussed the procedure including the risks, benefits and alternatives for the proposed anesthesia with the patient or authorized representative who has indicated his/her understanding and acceptance.       Plan Discussed with: Anesthesiologist and CRNA  Anesthesia Plan Comments:         Anesthesia Quick Evaluation

## 2024-02-23 NOTE — Plan of Care (Signed)

## 2024-02-23 NOTE — Transfer of Care (Signed)
 Immediate Anesthesia Transfer of Care Note  Patient: Mark Hammond  Procedure(s) Performed: ARTHROPLASTY, HIP, TOTAL,POSTERIOR APPROACH (Left: Hip)  Patient Location: PACU  Anesthesia Type:General  Level of Consciousness: awake, alert , and oriented  Airway & Oxygen Therapy: Patient Spontanous Breathing  Post-op Assessment: Report given to RN and Post -op Vital signs reviewed and stable  Post vital signs: Reviewed and stable  Last Vitals:  Vitals Value Taken Time  BP 117/98 02/23/24 1600  Temp 36.4 C 02/23/24 1600  Pulse 117 02/23/24 1604  Resp 13 02/23/24 1604  SpO2 100 % 02/23/24 1604  Vitals shown include unfiled device data.  Last Pain:  Vitals:   02/23/24 1603  TempSrc:   PainSc: 10-Worst pain ever         Complications: No notable events documented.

## 2024-02-23 NOTE — Progress Notes (Signed)
 Patient refused VS this am.

## 2024-02-23 NOTE — Progress Notes (Addendum)
 Called patient this morning to notify him that surgery is planned for today.  He was hoping to do it at a later time but I stressed the importance of getting infection under control asap.  He is agreeable to proceed.  Continue NPO.  Sidonie Drape, MD [facility] 9:28 AM
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
## 2024-02-23 NOTE — Progress Notes (Signed)
 Psychosocial Progressive/Outcome: ANOx4, calm and cooperative    Pain/Comfort Progression/Outcome: Pain treated with PRN medication    Clinical Progression/Outcome: Adequate fluid and diet intake  Independent in positioning  X1 assist w/ walker  Voids to urinal or BR No BM reported  CIWA protocol maintained  Opiote withdrawal protocol maintained  Woundvac maintained  Dressing D/C/I Slept in between care Maintained safety

## 2024-02-24 DIAGNOSIS — M869 Osteomyelitis, unspecified: Secondary | ICD-10-CM | POA: Diagnosis not present

## 2024-02-24 LAB — BASIC METABOLIC PANEL WITH GFR
Anion gap: 10 (ref 5–15)
BUN: 9 mg/dL (ref 6–20)
CO2: 26 mmol/L (ref 22–32)
Calcium: 9 mg/dL (ref 8.9–10.3)
Chloride: 101 mmol/L (ref 98–111)
Creatinine, Ser: 0.7 mg/dL (ref 0.61–1.24)
GFR, Estimated: >60 mL/min (ref >60)
Glucose, Bld: 184 mg/dL — ABNORMAL HIGH (ref 70–99)
Potassium: 3.6 mmol/L (ref 3.5–5.1)
Sodium: 137 mmol/L (ref 135–145)

## 2024-02-24 LAB — CBC
HCT: 22.5 % — ABNORMAL LOW (ref 39.0–52.0)
Hemoglobin: 7.6 g/dL — ABNORMAL LOW (ref 13.0–17.0)
MCH: 30 pg (ref 26.0–34.0)
MCHC: 33.8 g/dL (ref 30.0–36.0)
MCV: 88.9 fL (ref 80.0–100.0)
Platelets: 429 10*3/uL — ABNORMAL HIGH (ref 150–400)
RBC: 2.53 MIL/uL — ABNORMAL LOW (ref 4.22–5.81)
RDW: 15.1 % (ref 11.5–15.5)
WBC: 13.8 10*3/uL — ABNORMAL HIGH (ref 4.0–10.5)
nRBC: 0 % (ref 0.0–0.2)

## 2024-02-24 LAB — QUANTIFERON-TB GOLD PLUS (RQFGPL)
QuantiFERON Mitogen Value: >10 [IU]/mL
QuantiFERON Nil Value: 0.13 [IU]/mL
QuantiFERON TB1 Ag Value: 0.12 [IU]/mL
QuantiFERON TB2 Ag Value: 0.12 [IU]/mL

## 2024-02-24 LAB — QUANTIFERON-TB GOLD PLUS: QuantiFERON-TB Gold Plus: NEGATIVE

## 2024-02-24 LAB — HEPATITIS C GENOTYPE

## 2024-02-24 LAB — MAGNESIUM: Magnesium: 1.6 mg/dL — ABNORMAL LOW (ref 1.7–2.4)

## 2024-02-24 LAB — PHOSPHORUS: Phosphorus: 3.8 mg/dL (ref 2.5–4.6)
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
## 2024-02-24 NOTE — Plan of Care (Signed)

## 2024-02-24 NOTE — Progress Notes (Signed)
 PT Cancellation Note  Patient Details Name: Mark Hammond MRN: [id number] DOB: 10/01/1991   Cancelled Treatment:    Reason Eval/Treat Not Completed: Patient declined, no reason specified.   Pura Browns [facility] 02/24/2024, 12:48 PM Angelina Kempf PT Acute [employer] [phone]

## 2024-02-24 NOTE — Progress Notes (Signed)
 PROGRESS NOTE    Mark Hammond  WJX:[id number] DOB: 06-14-1991 DOA: 02/19/2024 PCP: Patient, No Pcp Per    Hospital course: 33 year old M polysubstance use/IVDU was recently discharged on 5/16 after treatment for septic left hip arthritis.  Patient was readmitted with cellulitis of the left foot ankle.  Patient underwent left hip arthroplasty for chronic septic arthritis and osteomyelitis with collapse of femoral head with failed IR drainage and outpatient antibiotic treatment  Subjective: Patient feels he is doing okay.  His main concern is hiccups which she has had intermittently for the past hours.  Otherwise he feels his pain is well-controlled.  Patient denies any red or black stools as far as he is aware.  Exam:  General: Comfortable patient lying in bed with intermittent hiccups Eyes: sclera anicteric, conjuctiva mild injection bilaterally CVS: S1-S2, regular  Respiratory:  decreased air entry bilaterally secondary to decreased inspiratory effort, rales at bases  GI: NABS, soft, NT  LE: Warm and well-perfused Neuro: A/O x 3,  grossly nonfocal.   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 Patient is status post left THA with placement of antibiotic spacer POD #1 Pain management per orthopedics, pain is well-controlled per his report  Decreasing H&H Hemoglobin decreased to 7.6 down from 10.5 yesterday  Likely postop blood loss Patient is hemodynamically stable Repeat H&H tomorrow and transfuse if warranted  Left foot cellulitis Continue cefazolin  today #4  Bipolar disorder Continue Seroquel   Respiratory failure secondary polysubstance abuse/heroin overdose--now resolved Presently on room air with no shortness of breath    DVT prophylaxis: Aspirin 81 twice daily per orthopedics Code Status: Full Family Communication: None today Disposition: TBD                 Diet Orders (From admission, onward)      Start     Ordered   02/23/24 2029  Diet regular Room service appropriate? Yes; Fluid consistency: Thin  Diet effective now       Question Answer Comment  Room service appropriate? Yes   Fluid consistency: Thin      02/23/24 2028            Objective: Vitals:   02/24/24 0744 02/24/24 0900 02/24/24 1237 02/24/24 1625  BP: 119/64 119/64 (!) 103/90 110/66  Pulse: 93 93 88 95  Resp:      Temp: 98.3 F (36.8 C)  97.9 F (36.6 C) 98.1 F (36.7 C)  TempSrc:   Oral   SpO2: 99%  100% 100%  Weight:      Height:        Intake/Output Summary (Last 24 hours) at 02/24/2024 1846 Last data filed at 02/24/2024 1539 Gross per 24 hour  Intake --  Output 2900 ml  Net -2900 ml   Filed Weights   02/20/24 0010 02/23/24 1155  Weight: 56.3 kg 59 kg    Scheduled Meds:  aspirin EC  81 mg Oral BID   docusate sodium   100 mg Oral BID   folic acid   1 mg Oral Daily   magnesium  oxide  400 mg Oral BID   multivitamin with minerals  1 tablet Oral Daily   nicotine   14 mg Transdermal Daily   nystatin    Topical TID   QUEtiapine   50 mg Oral QHS   thiamine   100 mg Oral Daily   Continuous Infusions:   ceFAZolin  (ANCEF ) IV 2 g (02/24/24 1405)    Nutritional status     Body  mass index is 23.03 kg/m.  Data Reviewed:   CBC: Recent Labs  Lab 02/20/24 0019 02/21/24 0749 02/22/24 0631 02/23/24 0728 02/24/24 0802  WBC 16.5* 5.3 3.5* 4.2 13.8*  NEUTROABS 12.8*  --   --   --   --   HGB 11.6* 10.3* 10.0* 10.5* 7.6*  HCT 35.7* 31.4* 31.3* 31.7* 22.5*  MCV 89.7 89.2 90.2 89.5 88.9  PLT 488* 242 248 306 429*   Basic Metabolic Panel: Recent Labs  Lab 02/20/24 0019 02/21/24 0749 02/22/24 0631 02/23/24 0728 02/24/24 0802  NA 136 136 141 137 137  K 3.3* 3.0* 3.5 3.6 3.6  CL 97* 96* 103 102 101  CO2 27 29 28 28 26   GLUCOSE 145* 114* 93 99 184*  BUN <5* 6 <5* 7 9  CREATININE 0.56* 0.54* 0.54* 0.51* 0.70  CALCIUM 9.1 9.0 9.3 9.2 9.0  MG 1.5* 1.6* 1.9 1.8 1.6*  PHOS  --   --   --   --   3.8   GFR: Estimated Creatinine Clearance: 106.7 mL/min (by C-G formula based on SCr of 0.7 mg/dL). Liver Function Tests: Recent Labs  Lab 02/20/24 0019 02/21/24 0749 02/23/24 0728  AST 48* 41 48*  ALT 26 22 27   ALKPHOS 102 103 100  BILITOT 1.0 0.6 0.3  PROT 8.8* 6.9 7.4  ALBUMIN  4.1 3.3* 3.3*   No results for input(s): "LIPASE", "AMYLASE" in the last 168 hours. No results for input(s): "AMMONIA" in the last 168 hours. Coagulation Profile: No results for input(s): "INR", "PROTIME" in the last 168 hours. Cardiac Enzymes: No results for input(s): "CKTOTAL", "CKMB", "CKMBINDEX", "TROPONINI" in the last 168 hours. BNP (last 3 results) No results for input(s): "PROBNP" in the last 8760 hours. HbA1C: No results for input(s): "HGBA1C" in the last 72 hours. CBG: Recent Labs  Lab 02/19/24 2120  GLUCAP 114*   Lipid Profile: No results for input(s): "CHOL", "HDL", "LDLCALC", "TRIG", "CHOLHDL", "LDLDIRECT" in the last 72 hours. Thyroid  Function Tests: No results for input(s): "TSH", "T4TOTAL", "FREET4", "T3FREE", "THYROIDAB" in the last 72 hours. Anemia Panel: No results for input(s): "VITAMINB12", "FOLATE", "FERRITIN", "TIBC", "IRON", "RETICCTPCT" in the last 72 hours. Sepsis Labs: Recent Labs  Lab 02/20/24 0028 02/20/24 0906  LATICACIDVEN 2.0* 1.6    Recent Results (from the past 240 hours)  Culture, blood (Routine X 2) w Reflex to ID Panel     Status: None (Preliminary result)   Collection Time: 02/20/24  7:37 AM   Specimen: BLOOD LEFT HAND  Result Value Ref Range Status   Specimen Description BLOOD LEFT HAND  Final   Special Requests   Final    BOTTLES DRAWN AEROBIC AND ANAEROBIC Blood Culture adequate volume   Culture   Final    NO GROWTH 4 DAYS Performed at [facility], [address], [place], [place] [address]    Report Status PENDING  Incomplete  Culture, blood (Routine X 2) w Reflex to ID Panel     Status: None (Preliminary result)   Collection Time:  02/20/24  7:45 AM   Specimen: BLOOD LEFT HAND  Result Value Ref Range Status   Specimen Description BLOOD LEFT HAND  Final   Special Requests   Final    BOTTLES DRAWN AEROBIC AND ANAEROBIC Blood Culture adequate volume   Culture   Final    NO GROWTH 4 DAYS Performed at [facility], [address], [place], [place] [address]    Report Status PENDING  Incomplete  Surgical pcr screen     Status: None   Collection Time: 02/23/24 12:04 PM   Specimen: Nasal Mucosa; Nasal Swab  Result Value Ref Range Status   MRSA, PCR NEGATIVE NEGATIVE Final   Staphylococcus aureus NEGATIVE NEGATIVE Final    Comment: (NOTE) The Xpert SA Assay (FDA approved for NASAL specimens in patients 3 years of age and older), is one component of a comprehensive surveillance program. It is not intended to diagnose infection nor to guide or monitor treatment. Performed at [facility], [address], [place], [place] [address]   Aerobic/Anaerobic Culture w Gram Stain (surgical/deep wound)     Status: None (Preliminary result)   Collection Time: 02/23/24  2:34 PM   Specimen: Path Tissue  Result Value Ref Range Status   Specimen Description TISSUE  Final   Special Requests left hip fluid  Final   Gram Stain NO WBC SEEN NO ORGANISMS SEEN   Final   Culture   Final    NO GROWTH < 24 HOURS Performed at [facility], [address], [place], [place] [address]    Report Status PENDING  Incomplete  Aerobic/Anaerobic Culture w Gram Stain (surgical/deep wound)     Status: None (Preliminary result)   Collection Time: 02/23/24  2:49 PM   Specimen: Path fluid; Body Fluid  Result Value Ref Range Status   Specimen Description SYNOVIAL LEFT HIP TISSUE  Final   Special Requests SAMPLE B  Final   Gram Stain   Final    NO WBC SEEN NO ORGANISMS SEEN Performed at [facility], [address], [place], [place] [address]    Culture PENDING  Incomplete   Report Status PENDING  Incomplete          Radiology Studies: DG Pelvis Portable Result Date: 02/24/2024 CLINICAL DATA:  Postoperative images of ORIF left hip. Best possible images as patient could not lay flat or straight now legs at this time EXAM: PORTABLE PELVIS 1-2 VIEWS COMPARISON:  CT 02/04/2024 FINDINGS: Postoperative change of left hip hemiarthroplasty. Soft tissue edema and gas in the lateral left flank. Cutaneous staples. IMPRESSION: Expected postoperative change of left hip arthroplasty. Electronically Signed   By: Rozell Cornet M.D.   On: 02/24/2024 00:42           LOS: 4 days   Time spent= 35 mins    Magdalene School, MD [facility]  If 7PM-7AM, please contact night-coverage  02/24/2024, 6:46 PM

## 2024-02-24 NOTE — Progress Notes (Signed)
 Subjective: 1 Day Post-Op Procedure(s) (LRB): ARTHROPLASTY, HIP, TOTAL,POSTERIOR APPROACH (Left) Patient reports pain as 8 on 0-10 scale.  Asking for stronger pain medication.   Objective: Vital signs in last 24 hours: Temp:  [97.6 F (36.4 C)-98.9 F (37.2 C)] 98.3 F (36.8 C) (05/24 0744) Pulse Rate:  [69-118] 93 (05/24 0900) Resp:  [16-21] 18 (05/24 0420) BP: (109-130)/(64-107) 119/64 (05/24 0900) SpO2:  [98 %-100 %] 99 % (05/24 0744) Weight:  [59 kg] 59 kg (05/23 1155)  Intake/Output from previous day: 05/23 0701 - 05/24 0700 In: 1200 [I.V.:700; IV Piggyback:500] Out: 3300 [Urine:2600; Blood:700] Intake/Output this shift: No intake/output data recorded.  Recent Labs    02/22/24 0631 02/23/24 0728 02/24/24 0802  HGB 10.0* 10.5* 7.6*   Recent Labs    02/23/24 0728 02/24/24 0802  WBC 4.2 13.8*  RBC 3.54* 2.53*  HCT 31.7* 22.5*  PLT 306 429*   Recent Labs    02/23/24 0728 02/24/24 0802  NA 137 137  K 3.6 3.6  CL 102 101  CO2 28 26  BUN 7 9  CREATININE 0.51* 0.70  GLUCOSE 99 184*  CALCIUM 9.2 9.0   No results for input(s): "LABPT", "INR" in the last 72 hours.  Left hip: Wound VAC in place canister empty. Left lower extremity calf supple dorsiflexion plantarflexion intact.   Assessment/Plan: 1 Day Post-Op Procedure(s) (LRB): ARTHROPLASTY, HIP, TOTAL,POSTERIOR APPROACH (Left) Up with therapy Touch down weight bearing left lower extremity On Aspirin for DVT prophylaxis       Kaysa Roulhac 02/24/2024, 11:03 AM

## 2024-02-24 NOTE — Hospital Course (Addendum)
 33 year old M polysubstance use/IVDU was recently discharged on 5/16 after treatment for septic left hip arthritis.  Patient was readmitted with cellulitis of the left foot ankle.  Patient underwent left hip arthroplasty for chronic septic arthritis and osteomyelitis with collapse of femoral head with failed IR drainage and outpatient antibiotic treatment.     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 Wound vac removed by ortho. Initial recommendation for 2-4 weeks of IV cefazolin  with transition to p.o. upon discharge.   - Repeat echo 6/10 unremarkable. ID follow up advised IV cefazolin  till 6/18 and then switch to oral cefadroxil  1gm bid for several weeks with ID follow up outpatient. - Has outpatient follow-up appointment with ID scheduled for 04/15/2024 with Dr. Luiz - [facility] being pursued; no narcotics allowed; will taper norco off - Technically medically stable for discharge once disposition confirmed   Normocytic anemia Hemoglobin stable s/p 1 unit PRBCs on 5/27.  No active bleeding appreciated.    Left foot cellulitis Antibiotics as above.   Bipolar disorder Continue Seroquel .   Acute hypoxic respiratory failure secondary to opioid overdose-resolved Resolved.  Now on room air.   Polysubstance abuse: Discontinued CIWA protocol as he has been inpatient more than 20 days. Advised to quit illicit drugs. Planning possible rehab

## 2024-02-24 NOTE — Anesthesia Postprocedure Evaluation (Signed)
 Anesthesia Post Note  Patient: Mark Hammond  Procedure(s) Performed: ARTHROPLASTY, HIP, TOTAL,POSTERIOR APPROACH (Left: Hip)     Patient location during evaluation: PACU Anesthesia Type: General Level of consciousness: awake and alert Pain management: pain level controlled Vital Signs Assessment: post-procedure vital signs reviewed and stable Respiratory status: spontaneous breathing, nonlabored ventilation, respiratory function stable and patient connected to nasal cannula oxygen Cardiovascular status: blood pressure returned to baseline and stable Postop Assessment: no apparent nausea or vomiting Anesthetic complications: no   No notable events documented.  Last Vitals:  Vitals:   02/23/24 2027 02/24/24 0420  BP: 128/80 109/71  Pulse: (!) 104 84  Resp: 18 18  Temp: 37.2 C 36.9 C  SpO2: 100% 100%    Last Pain:  Vitals:   02/24/24 0559  TempSrc:   PainSc: 10-Worst pain ever                 Tagen Milby

## 2024-02-25 DIAGNOSIS — M869 Osteomyelitis, unspecified: Secondary | ICD-10-CM | POA: Diagnosis not present

## 2024-02-25 LAB — CBC
HCT: 21.3 % — ABNORMAL LOW (ref 39.0–52.0)
HCT: 24.2 % — ABNORMAL LOW (ref 39.0–52.0)
Hemoglobin: 6.9 g/dL — CL (ref 13.0–17.0)
Hemoglobin: 7.9 g/dL — ABNORMAL LOW (ref 13.0–17.0)
MCH: 29.4 pg (ref 26.0–34.0)
MCH: 29.2 pg (ref 26.0–34.0)
MCHC: 32.4 g/dL (ref 30.0–36.0)
MCHC: 32.6 g/dL (ref 30.0–36.0)
MCV: 90.6 fL (ref 80.0–100.0)
MCV: 89.3 fL (ref 80.0–100.0)
Platelets: 266 10*3/uL (ref 150–400)
Platelets: 263 10*3/uL (ref 150–400)
RBC: 2.35 MIL/uL — ABNORMAL LOW (ref 4.22–5.81)
RBC: 2.71 MIL/uL — ABNORMAL LOW (ref 4.22–5.81)
RDW: 15.4 % (ref 11.5–15.5)
RDW: 14.9 % (ref 11.5–15.5)
WBC: 6.2 10*3/uL (ref 4.0–10.5)
WBC: 6.4 10*3/uL (ref 4.0–10.5)
nRBC: 0 % (ref 0.0–0.2)
nRBC: 0 % (ref 0.0–0.2)

## 2024-02-25 LAB — CULTURE, BLOOD (ROUTINE X 2)
Culture: NO GROWTH
Culture: NO GROWTH
Special Requests: ADEQUATE
Special Requests: ADEQUATE

## 2024-02-25 LAB — PREPARE RBC (CROSSMATCH)

## 2024-02-25 MED ORDER — SODIUM CHLORIDE 0.9% IV SOLUTION: Freq: Once | INTRAVENOUS | Status: AC

## 2024-02-25 MED ORDER — MAGNESIUM SULFATE 2 GM/50ML IV SOLN
2.0000 g | Freq: Once | INTRAVENOUS | Status: AC
  Administered 2024-02-25: 2 g via INTRAVENOUS
  Filled 2024-02-25: qty 50

## 2024-02-25 NOTE — Progress Notes (Signed)
 Nurse stated IV blood infusion at 0907, no s/s of reaction noted, vital signs with in normal limits. Pt tolerating blood transfusion well.

## 2024-02-25 NOTE — Consult Note (Signed)
 Patient is 33 year old male with history of ? bipolar disorder, multiple substance abuse (Amphetamine, BZD, Opiates, cocaine ) and  chronic alcohol abuse, chronic left thigh pain, chronic left thigh numbness, chronic transaminitis, Hep C antibody (+) who was admitted after drug overdose. Psychiatry consult was placed for "Patient screened positive for [place] suicide screen." On approach by this Clinical research associate, patient states he has never requested to talk to a psychiatrist and he was never informed by any of his treating doctor that he would be speaking to a psychiatrist. Patient was receiving blood transfusion during the encounter, he states he was tired and asked writer to leave his room. Of note, patient denies suicidal or homicidal ideation, intent or plan. Psychiatric evaluation would be deferred for now as patient is not interested. Please re-consult as needed. Thank you for this consult.  Arlie Lain, MD.  Attending psychiatrist.

## 2024-02-25 NOTE — Evaluation (Signed)
 Physical Therapy Evaluation Patient Details Name: Mark Hammond MRN: [id number] DOB: 13-Oct-1990 Today's Date: 02/25/2024  History of Present Illness  Pt is a 33 y/o male presenting with ongoing L lower extremity pain in setting of MSSA, cellulitis, osteomyelitis and septic arthritis.Underwent resection of left femoral head, placement of antibiotic hip spacer (posterior approach) on 5/23 w/ I&D of L hip abscess and application of wound vac. Recent admission 4/30-5/16 for osteomyelitis and cellulitis of LLE w/ hip aspiration and discharged on PO antibiotics. ED visit 5/19 for OD. PMH: lcoholic gastritis, BPD, Depression, drug abuse, ETOH abuse, hallucination, schizo affective schizophrenia.  Clinical Impression  Pt admitted with above diagnosis and presents to PT with functional limitations due to deficits listed below (See PT problem list). Pt needs skilled PT to maximize independence and safety. Pt agreeable and asking to amb to the bathroom. Pt with significant pain in lt hip and he reports he tried to get up unassisted prior to my arrival using rolling bedside table and he slipped and sat back down on the be rapidly. He does well maintaining his limited wt bearing with amb but I do not think he truly fully understands his hip precautions. Reviewed precautions and reemphasized if he is lying on his side he must have a pillow or blankets between knees. Currently isn't ready to mobilize community distances. Pt has mentioned wanting to go to a drug rehab. Unsure of what options he has at dc.            If plan is discharge home, recommend the following: Assist for transportation   Can travel by private vehicle        Equipment Recommendations Rolling walker (2 wheels);Wheelchair (measurements PT);Wheelchair cushion (measurements PT)  Recommendations for Other Services       Functional Status Assessment Patient has had a recent decline in their functional status and demonstrates the ability to  make significant improvements in function in a reasonable and predictable amount of time.     Precautions / Restrictions Precautions Precautions: Fall;Posterior Hip Precaution Booklet Issued: Yes (comment) Recall of Precautions/Restrictions: Impaired Precaution/Restrictions Comments: Pt does not fully grasp posterior hip precautions as far as functionally moving. LLE wound vac Restrictions Weight Bearing Restrictions Per Provider Order: Yes LLE Weight Bearing Per Provider Order: Touchdown weight bearing      Mobility  Bed Mobility Overal bed mobility: Needs Assistance Bed Mobility: Sidelying to Sit, Sit to Sidelying   Sidelying to sit: Modified independent (Device/Increase time), HOB elevated, Used rails     Sit to sidelying: Modified independent (Device/Increase time), HOB elevated, Used rails General bed mobility comments: Instructed pt to use pillow between knees when performing to follow hip precautions    Transfers Overall transfer level: Needs assistance Equipment used: Rolling walker (2 wheels) Transfers: Sit to/from Stand Sit to Stand: Min assist           General transfer comment: Assist for support due to pain. Verbal/tactile cues to follow hip precautions with pt tending to not place LLE out in front to decr hip flexion and weight bearing    Ambulation/Gait Ambulation/Gait assistance: Contact guard assist Gait Distance (Feet): 12 Feet (x 2) Assistive device: Rolling walker (2 wheels) Gait Pattern/deviations:  (hop to) Gait velocity: decr Gait velocity interpretation: <1.31 ft/sec, indicative of household ambulator   General Gait Details: Assist for safety and for VAC. Pt with good maintainence of weight bearing  Careers information officer  Tilt Bed    Modified Rankin (Stroke Patients Only)       Balance Overall balance assessment: No apparent balance deficits (not formally assessed)                                            Pertinent Vitals/Pain Pain Assessment Pain Assessment: Faces Faces Pain Scale: Hurts whole lot Pain Location: lt hip Pain Descriptors / Indicators: Moaning, Guarding, Grimacing Pain Intervention(s): Limited activity within patient's tolerance, Monitored during session, Repositioned    Home Living Family/patient expects to be discharged to:: Shelter/Homeless                   Additional Comments: currently homeless; reports desire to discharge to a substance abuse inpatient rehab facility to stay clean at discharge    Prior Function Prior Level of Function : Independent/Modified Independent             Mobility Comments: pt was using rollator for mobility in community, limited WB due to pain ADLs Comments: Reports was managing ADLs though difficult.     Extremity/Trunk Assessment   Upper Extremity Assessment Upper Extremity Assessment: Defer to OT evaluation    Lower Extremity Assessment Lower Extremity Assessment: LLE deficits/detail LLE Deficits / Details: limited by pain and hip precautions    Cervical / Trunk Assessment Cervical / Trunk Assessment: Normal  Communication   Communication Communication: No apparent difficulties    Cognition Arousal: Alert Behavior During Therapy: WFL for tasks assessed/performed   PT - Cognitive impairments: Safety/Judgement                       PT - Cognition Comments: Pt doesn't seem to truly understand his hip precautions. Pt also reports prior to my arrival he tried to get up on his own using rolling bedside table to get to his walker. Following commands: Impaired Following commands impaired: Only follows one step commands consistently     Cueing Cueing Techniques: Verbal cues, Gestural cues, Tactile cues, Visual cues     General Comments      Exercises     Assessment/Plan    PT Assessment Patient needs continued PT services  PT Problem List Decreased strength;Decreased range of  motion;Decreased activity tolerance;Decreased mobility;Decreased safety awareness;Decreased knowledge of precautions;Pain       PT Treatment Interventions DME instruction;Gait training;Functional mobility training;Therapeutic activities;Therapeutic exercise;Patient/family education    PT Goals (Current goals can be found in the Care Plan section)  Acute Rehab PT Goals Patient Stated Goal: decr pain PT Goal Formulation: With patient Time For Goal Achievement: 03/10/24 Potential to Achieve Goals: Fair Additional Goals Additional Goal #1: Pt will verbalize and demonstrate posterior hip precautions.    Frequency Min 2X/week     Co-evaluation               AM-PAC PT "6 Clicks" Mobility  Outcome Measure Help needed turning from your back to your side while in a flat bed without using bedrails?: None Help needed moving from lying on your back to sitting on the side of a flat bed without using bedrails?: None Help needed moving to and from a bed to a chair (including a wheelchair)?: A Little Help needed standing up from a chair using your arms (e.g., wheelchair or bedside chair)?: A Little Help needed to walk in hospital room?: A Little Help needed climbing  3-5 steps with a railing? : Total 6 Click Score: 18    End of Session   Activity Tolerance: Patient limited by pain Patient left: in bed;with call bell/phone within reach;with bed alarm set Nurse Communication: Mobility status;Other (comment) (pt's report that he slipped getting up unassisted and sat down on bed hard on his lt hip) PT Visit Diagnosis: Other abnormalities of gait and mobility (R26.89);Pain Pain - Right/Left: Left Pain - part of body: Hip    Time: 1457-1520 PT Time Calculation (min) (ACUTE ONLY): 23 min   Charges:   PT Evaluation $PT Eval Moderate Complexity: 1 Mod PT Treatments $Gait Training: 8-22 mins PT General Charges $$ ACUTE PT VISIT: 1 Visit         [facility] PT Acute Rehabilitation  Services Office [phone]   Pura Browns [facility] 02/25/2024, 5:14 PM

## 2024-02-25 NOTE — Progress Notes (Signed)
 Patient completed blood transfusion at 1207, no adverse reaction noted, Pt lying in bed with eyes open with even unlabored respirations, skin is warm and dry to the touch. Pt monitored during the shift, no immediate concerns noted, ongoing assessment with no changes observed at this time.

## 2024-02-25 NOTE — Progress Notes (Signed)
 Subjective: 2 Days Post-Op Procedure(s) (LRB): ARTHROPLASTY, HIP, TOTAL,POSTERIOR APPROACH (Left) Patient reports pain as moderate, but states pain medications are overall controlling his pain. Denies any dizziness this AM. Patient declined PT yesterday.   Objective: Vital signs in last 24 hours: Temp:  [97.9 F (36.6 C)-100.2 F (37.9 C)] 100.2 F (37.9 C) (05/25 0513) Pulse Rate:  [65-96] 65 (05/25 0513) Resp:  [16-18] 16 (05/25 0513) BP: (99-119)/(48-90) 110/60 (05/25 0513) SpO2:  [100 %] 100 % (05/25 0513)  Intake/Output from previous day: 05/24 0701 - 05/25 0700 In: -  Out: 800 [Urine:300; Emesis/NG output:500] Intake/Output this shift: No intake/output data recorded.  Recent Labs    02/23/24 0728 02/24/24 0802 02/25/24 0729  HGB 10.5* 7.6* 6.9*   Recent Labs    02/24/24 0802 02/25/24 0729  WBC 13.8* 6.2  RBC 2.53* 2.35*  HCT 22.5* 21.3*  PLT 429* 266   Recent Labs    02/23/24 0728 02/24/24 0802  NA 137 137  K 3.6 3.6  CL 102 101  CO2 28 26  BUN 7 9  CREATININE 0.51* 0.70  GLUCOSE 99 184*  CALCIUM 9.2 9.0   No results for input(s): "LABPT", "INR" in the last 72 hours.  Incision: Wound vac in place canister empty  Compartment soft about left hip.  Incision area clean and dry . No erythema.    Assessment/Plan: 2 Days Post-Op Procedure(s) (LRB): ARTHROPLASTY, HIP, TOTAL,POSTERIOR APPROACH (Left) Up with therapy Touch down weight bearing left lower extremity.  Acute on chronic anemia exacerbated by surgical intervention, Hgb now 6.9 . Vitals stable . May benefit from transfusion . Will defer to Hospitalist .  On ASA for DVT prophylaxis      Bj Morlock 02/25/2024, 8:07 AM

## 2024-02-25 NOTE — Progress Notes (Addendum)
 PROGRESS NOTE    Mark Hammond  ZOX:[id number] DOB: 05-28-1991 DOA: 02/19/2024 PCP: Patient, No Pcp Per    Hospital course: 33 year old M polysubstance use/IVDU was recently discharged on 5/16 after treatment for septic left hip arthritis.  Patient was readmitted with cellulitis of the left foot ankle.  Patient underwent left hip arthroplasty for chronic septic arthritis and osteomyelitis with collapse of femoral head with failed IR drainage and outpatient antibiotic treatment  Subjective:  Patient without new complaints.  Understands why he needs a blood transfusion and is agreeable.  Hiccups have resolved.  Exam:  General: Comfortable patient lying in bed in NAD Eyes: sclera anicteric, conjuctiva mild injection bilaterally CVS: S1-S2, regular  Respiratory:  decreased air entry bilaterally secondary to decreased inspiratory effort, rales at bases  GI: NABS, soft, NT  LE: Warm and well-perfused Neuro: A/O x 3,  grossly nonfocal.   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 Patient is status post left THA with placement of antibiotic spacer POD #2 Pain management per orthopedics, pain is well-controlled per his report  Decreasing H&H Hemoglobin decreased to 6.9 this morning, down from 7.6 yesterday  Patient is hemodynamically stable Will transfused 1 U PRBC and follow H/H  Hypomagnesemia Will replete and recheck  Left foot cellulitis Continue cefazolin  today #5  Bipolar disorder Continue Seroquel   Respiratory failure secondary polysubstance abuse/heroin overdose--now resolved Presently on room air with no shortness of breath    DVT prophylaxis: Aspirin 81 twice daily per orthopedics Code Status: Full Family Communication: None today Disposition: TBD        Diet Orders (From admission, onward)     Start     Ordered   02/23/24 2029  Diet regular Room service appropriate? Yes; Fluid consistency: Thin  Diet  effective now       Question Answer Comment  Room service appropriate? Yes   Fluid consistency: Thin      02/23/24 2028            Objective: Vitals:   02/25/24 1200 02/25/24 1207 02/25/24 1237 02/25/24 1635  BP: 104/68 104/68 104/68 (!) 112/58  Pulse: 61 61 61 86  Resp: 18 18 18 18   Temp: 99.8 F (37.7 C) 99.6 F (37.6 C) 99.8 F (37.7 C) 98.4 F (36.9 C)  TempSrc: Oral Oral Oral Oral  SpO2:  100% 100% 100%  Weight:      Height:        Intake/Output Summary (Last 24 hours) at 02/25/2024 1718 Last data filed at 02/25/2024 1237 Gross per 24 hour  Intake 354 ml  Output 200 ml  Net 154 ml   Filed Weights   02/20/24 0010 02/23/24 1155  Weight: 56.3 kg 59 kg    Scheduled Meds:  aspirin EC  81 mg Oral BID   docusate sodium   100 mg Oral BID   folic acid   1 mg Oral Daily   magnesium  oxide  400 mg Oral BID   multivitamin with minerals  1 tablet Oral Daily   nicotine   14 mg Transdermal Daily   nystatin    Topical TID   QUEtiapine   50 mg Oral QHS   thiamine   100 mg Oral Daily   Continuous Infusions:   ceFAZolin  (ANCEF ) IV 2 g (02/25/24 1315)   magnesium  sulfate bolus IVPB      Nutritional status     Body mass index is 23.03 kg/m.  Data Reviewed:   CBC: Recent Labs  Lab 02/20/24  9604 02/21/24 0749 02/22/24 0631 02/23/24 0728 02/24/24 0802 02/25/24 0729 02/25/24 1624  WBC 16.5*   < > 3.5* 4.2 13.8* 6.2 6.4  NEUTROABS 12.8*  --   --   --   --   --   --   HGB 11.6*   < > 10.0* 10.5* 7.6* 6.9* 7.9*  HCT 35.7*   < > 31.3* 31.7* 22.5* 21.3* 24.2*  MCV 89.7   < > 90.2 89.5 88.9 90.6 89.3  PLT 488*   < > 248 306 429* 266 263   < > = values in this interval not displayed.   Basic Metabolic Panel: Recent Labs  Lab 02/20/24 0019 02/21/24 0749 02/22/24 0631 02/23/24 0728 02/24/24 0802  NA 136 136 141 137 137  K 3.3* 3.0* 3.5 3.6 3.6  CL 97* 96* 103 102 101  CO2 27 29 28 28 26   GLUCOSE 145* 114* 93 99 184*  BUN <5* 6 <5* 7 9  CREATININE 0.56*  0.54* 0.54* 0.51* 0.70  CALCIUM 9.1 9.0 9.3 9.2 9.0  MG 1.5* 1.6* 1.9 1.8 1.6*  PHOS  --   --   --   --  3.8   GFR: Estimated Creatinine Clearance: 106.7 mL/min (by C-G formula based on SCr of 0.7 mg/dL). Liver Function Tests: Recent Labs  Lab 02/20/24 0019 02/21/24 0749 02/23/24 0728  AST 48* 41 48*  ALT 26 22 27   ALKPHOS 102 103 100  BILITOT 1.0 0.6 0.3  PROT 8.8* 6.9 7.4  ALBUMIN  4.1 3.3* 3.3*   No results for input(s): "LIPASE", "AMYLASE" in the last 168 hours. No results for input(s): "AMMONIA" in the last 168 hours. Coagulation Profile: No results for input(s): "INR", "PROTIME" in the last 168 hours. Cardiac Enzymes: No results for input(s): "CKTOTAL", "CKMB", "CKMBINDEX", "TROPONINI" in the last 168 hours. BNP (last 3 results) No results for input(s): "PROBNP" in the last 8760 hours. HbA1C: No results for input(s): "HGBA1C" in the last 72 hours. CBG: Recent Labs  Lab 02/19/24 2120  GLUCAP 114*   Lipid Profile: No results for input(s): "CHOL", "HDL", "LDLCALC", "TRIG", "CHOLHDL", "LDLDIRECT" in the last 72 hours. Thyroid  Function Tests: No results for input(s): "TSH", "T4TOTAL", "FREET4", "T3FREE", "THYROIDAB" in the last 72 hours. Anemia Panel: No results for input(s): "VITAMINB12", "FOLATE", "FERRITIN", "TIBC", "IRON", "RETICCTPCT" in the last 72 hours. Sepsis Labs: Recent Labs  Lab 02/20/24 0028 02/20/24 0906  LATICACIDVEN 2.0* 1.6    Recent Results (from the past 240 hours)  Culture, blood (Routine X 2) w Reflex to ID Panel     Status: None   Collection Time: 02/20/24  7:37 AM   Specimen: BLOOD LEFT HAND  Result Value Ref Range Status   Specimen Description BLOOD LEFT HAND  Final   Special Requests   Final    BOTTLES DRAWN AEROBIC AND ANAEROBIC Blood Culture adequate volume   Culture   Final    NO GROWTH 5 DAYS Performed at [facility], [address], [place], [place] [address]    Report Status 02/25/2024 FINAL  Final  Culture, blood  (Routine X 2) w Reflex to ID Panel     Status: None   Collection Time: 02/20/24  7:45 AM   Specimen: BLOOD LEFT HAND  Result Value Ref Range Status   Specimen Description BLOOD LEFT HAND  Final   Special Requests   Final    BOTTLES DRAWN AEROBIC AND ANAEROBIC Blood Culture adequate volume   Culture   Final  NO GROWTH 5 DAYS Performed at [facility], [address] [place], [place] [address]    Report Status 02/25/2024 FINAL  Final  Surgical pcr screen     Status: None   Collection Time: 02/23/24 12:04 PM   Specimen: Nasal Mucosa; Nasal Swab  Result Value Ref Range Status   MRSA, PCR NEGATIVE NEGATIVE Final   Staphylococcus aureus NEGATIVE NEGATIVE Final    Comment: (NOTE) The Xpert SA Assay (FDA approved for NASAL specimens in patients 26 years of age and older), is one component of a comprehensive surveillance program. It is not intended to diagnose infection nor to guide or monitor treatment. Performed at [facility], [address], [place], [place] [address]   Aerobic/Anaerobic Culture w Gram Stain (surgical/deep wound)     Status: None (Preliminary result)   Collection Time: 02/23/24  2:34 PM   Specimen: Path Tissue  Result Value Ref Range Status   Specimen Description TISSUE  Final   Special Requests left hip fluid  Final   Gram Stain NO WBC SEEN NO ORGANISMS SEEN   Final   Culture   Final    NO GROWTH 2 DAYS NO ANAEROBES ISOLATED; CULTURE IN PROGRESS FOR 5 DAYS Performed at [facility], [address] [place], [place] [address]    Report Status PENDING  Incomplete  Aerobic/Anaerobic Culture w Gram Stain (surgical/deep wound)     Status: None (Preliminary result)   Collection Time: 02/23/24  2:49 PM   Specimen: Path fluid; Body Fluid  Result Value Ref Range Status   Specimen Description SYNOVIAL LEFT HIP TISSUE  Final   Special Requests SAMPLE B  Final   Gram Stain NO WBC SEEN NO ORGANISMS SEEN   Final   Culture   Final    NO GROWTH 2  DAYS NO ANAEROBES ISOLATED; CULTURE IN PROGRESS FOR 5 DAYS Performed at [facility], [address] [place], [place] [address]    Report Status PENDING  Incomplete         Radiology Studies: DG Pelvis Portable Result Date: 02/24/2024 CLINICAL DATA:  Postoperative images of ORIF left hip. Best possible images as patient could not lay flat or straight now legs at this time EXAM: PORTABLE PELVIS 1-2 VIEWS COMPARISON:  CT 02/04/2024 FINDINGS: Postoperative change of left hip hemiarthroplasty. Soft tissue edema and gas in the lateral left flank. Cutaneous staples. IMPRESSION: Expected postoperative change of left hip arthroplasty. Electronically Signed   By: Rozell Cornet M.D.   On: 02/24/2024 00:42           LOS: 5 days   Time spent= 35 mins    Magdalene School, MD [facility]  If 7PM-7AM, please contact night-coverage  02/25/2024, 5:18 PM

## 2024-02-26 DIAGNOSIS — M869 Osteomyelitis, unspecified: Secondary | ICD-10-CM | POA: Diagnosis not present

## 2024-02-26 LAB — MAGNESIUM: Magnesium: 2.2 mg/dL (ref 1.7–2.4)

## 2024-02-26 LAB — CBC
HCT: 25 % — ABNORMAL LOW (ref 39.0–52.0)
Hemoglobin: 8.2 g/dL — ABNORMAL LOW (ref 13.0–17.0)
MCH: 29.4 pg (ref 26.0–34.0)
MCHC: 32.8 g/dL (ref 30.0–36.0)
MCV: 89.6 fL (ref 80.0–100.0)
Platelets: 267 10*3/uL (ref 150–400)
RBC: 2.79 MIL/uL — ABNORMAL LOW (ref 4.22–5.81)
RDW: 15 % (ref 11.5–15.5)
WBC: 6.2 10*3/uL (ref 4.0–10.5)
nRBC: 0 % (ref 0.0–0.2)

## 2024-02-26 LAB — TYPE AND SCREEN
ABO/RH(D): O POS
Antibody Screen: NEGATIVE
Unit division: 0

## 2024-02-26 LAB — BPAM RBC
Blood Product Expiration Date: 202506202359
ISSUE DATE / TIME: 202505250841
Unit Type and Rh: 5100

## 2024-02-26 MED ORDER — HYDROCODONE-ACETAMINOPHEN 7.5-325 MG PO TABS
1.0000 | ORAL_TABLET | ORAL | Status: AC
  Administered 2024-02-26: 1 via ORAL
  Filled 2024-02-26: qty 1

## 2024-02-26 NOTE — TOC Progression Note (Signed)
 Transition of Care [facility]) - Progression Note    Patient Details  Name: Mark Hammond MRN: [id number] Date of Birth: Mar 13, 1991  Transition of Care [facility]) CM/SW Contact  Mark Dung, RN Phone Number: 02/26/2024, 4:22 PM  Clinical Narrative:    CM met with the patient at the bedside to discuss TOC needs.  [place] and does not have family/friends to stay with if discharged home from the hospital.  Patient states that he has an ex-wife and uncle and states that neither of them will offer him shelter.  Patient has history of recent drug use and SNF placement is not an option for this patient due to patient's recent drug use/payor source and age.  Patient was provided with Inpatient Drug rehabilitation resource but I explained to the patient that these facilities will not offer inpatient stay until patient is independent of ADL's and without use of walker.  I called and spoke with patient's uncle, Merian Stamps and he states that he is not able to offer shelter to the patient and patient does not have any other family/friends able to offer shelter.  Patient's uncle states that CM could call and speak with patient's Medicaid caseworker - Arlan Belling [phone].  I will call him tomorrow.  CM and MSW will continue to follow the patient for safe discharge plan.   Patient is homeless with no availability to income nor place to stay after discharge at this time.            Expected Discharge Plan and Services                                               Social Determinants of Health (SDOH) Interventions SDOH Screenings   Food Insecurity: Food Insecurity Present (02/20/2024)  Housing: High Risk (02/20/2024)  Transportation Needs: Unmet Transportation Needs (02/20/2024)  Utilities: At Risk (02/20/2024)  Alcohol Screen: High Risk (02/23/2023)  Depression (PHQ2-9): Low Risk  (01/17/2024)  Recent Concern: Depression  (PHQ2-9) - High Risk (01/14/2024)  Social Connections: Unknown (02/20/2024)  Tobacco Use: High Risk (02/23/2024)    Readmission Risk Interventions    02/26/2024    4:21 PM 02/20/2024    3:21 PM 02/02/2024   11:06 AM  Readmission Risk Prevention Plan  Transportation Screening Complete Complete Complete  Medication Review Oceanographer) Complete Complete Complete  PCP or Specialist appointment within 3-5 days of discharge Complete Complete Complete  HRI or Home Care Consult Complete Complete Complete  SW Recovery Care/Counseling Consult Not Complete Complete Complete  Palliative Care Screening Not Applicable Not Applicable Not Applicable  Skilled Nursing Facility Not Applicable Not Applicable Not Applicable

## 2024-02-26 NOTE — Progress Notes (Signed)
 PROGRESS NOTE    Mark Hammond  EXB:[id number] DOB: 1991/03/01 DOA: 02/19/2024 PCP: Patient, No Pcp Per    Hospital course: 33 year old M polysubstance use/IVDU was recently discharged on 5/16 after treatment for septic left hip arthritis.  Patient was readmitted with cellulitis of the left foot ankle.  Patient underwent left hip arthroplasty for chronic septic arthritis and osteomyelitis with collapse of femoral head with failed IR drainage and outpatient antibiotic treatment.  He is presently getting treatment for foot cellulitis.  Subjective:  Patient fell against the bed and hit it with his thigh when he tried to go to bathroom yesterday, c/o increased hip pain today. No injury directly to hip.   Exam:  General: Comfortable patient lying in bed in NAD Eyes: sclera anicteric, conjuctiva mild injection bilaterally CVS: S1-S2, regular  Respiratory:  decreased air entry bilaterally secondary to decreased inspiratory effort, rales at bases  GI: NABS, soft, NT  LE: Warm and well-perfused Neuro: A/O x 3,  grossly nonfocal.   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 Patient is status post left THA with placement of antibiotic spacer POD #3 Pain management per orthopedics Given increased pain after hitting his thigh while trying to get up yesterday, will provide 1 extra pill of hydrocodone .  I will be important for patient to start mobilizing although patient is reluctant.  Prescient ongoing PT involvement and treatment.  Decreasing H&H Appropriate increase in Hg after tranfusion yesterday, now 8.2 up from 6.9 Patient is hemodynamically stable  Hypomagnesemia Normalized with repletion   Left foot cellulitis Continue cefazolin  today #6 Will need to clarify with ID whether he needs to continue 2 weeks of antibiotics as previously discussed as he has now had a hip replacement and no longer has chronic septic arthritis.  Bipolar  disorder Continue Seroquel   Respiratory failure secondary polysubstance abuse/heroin overdose--now resolved Presently on room air with no shortness of breath    DVT prophylaxis: Aspirin 81 twice daily per orthopedics Code Status: Full Family Communication: None today Disposition: TBD--TOC actively working on this, patient is homeless and appears to have a very low pain threshold.  Not sure how well he would rehab since he is not eligible to go to SNF and has a reluctance to move around due to pain..        Diet Orders (From admission, onward)     Start     Ordered   02/23/24 2029  Diet regular Room service appropriate? Yes; Fluid consistency: Thin  Diet effective now       Question Answer Comment  Room service appropriate? Yes   Fluid consistency: Thin      02/23/24 2028            Objective: Vitals:   02/26/24 0800 02/26/24 0802 02/26/24 1200 02/26/24 1621  BP: 118/71 118/71 103/75 116/67  Pulse: 84 84 82 70  Resp:  19  19  Temp:  98.5 F (36.9 C)  98.6 F (37 C)  TempSrc:    Oral  SpO2:  99%  100%  Weight:      Height:        Intake/Output Summary (Last 24 hours) at 02/26/2024 1654 Last data filed at 02/26/2024 0601 Gross per 24 hour  Intake --  Output 1500 ml  Net -1500 ml   Filed Weights   02/20/24 0010 02/23/24 1155  Weight: 56.3 kg 59 kg    Scheduled Meds:  aspirin EC  81 mg Oral  BID   docusate sodium   100 mg Oral BID   folic acid   1 mg Oral Daily   magnesium  oxide  400 mg Oral BID   multivitamin with minerals  1 tablet Oral Daily   nicotine   14 mg Transdermal Daily   nystatin    Topical TID   QUEtiapine   50 mg Oral QHS   thiamine   100 mg Oral Daily   Continuous Infusions:   ceFAZolin  (ANCEF ) IV 2 g (02/26/24 1453)    Nutritional status     Body mass index is 23.03 kg/m.  Data Reviewed:   CBC: Recent Labs  Lab 02/20/24 0019 02/21/24 0749 02/23/24 0728 02/24/24 0802 02/25/24 0729 02/25/24 1624 02/26/24 0531  WBC 16.5*   <  > 4.2 13.8* 6.2 6.4 6.2  NEUTROABS 12.8*  --   --   --   --   --   --   HGB 11.6*   < > 10.5* 7.6* 6.9* 7.9* 8.2*  HCT 35.7*   < > 31.7* 22.5* 21.3* 24.2* 25.0*  MCV 89.7   < > 89.5 88.9 90.6 89.3 89.6  PLT 488*   < > 306 429* 266 263 267   < > = values in this interval not displayed.   Basic Metabolic Panel: Recent Labs  Lab 02/20/24 0019 02/21/24 0749 02/22/24 0631 02/23/24 0728 02/24/24 0802 02/26/24 0531  NA 136 136 141 137 137  --   K 3.3* 3.0* 3.5 3.6 3.6  --   CL 97* 96* 103 102 101  --   CO2 27 29 28 28 26   --   GLUCOSE 145* 114* 93 99 184*  --   BUN <5* 6 <5* 7 9  --   CREATININE 0.56* 0.54* 0.54* 0.51* 0.70  --   CALCIUM 9.1 9.0 9.3 9.2 9.0  --   MG 1.5* 1.6* 1.9 1.8 1.6* 2.2  PHOS  --   --   --   --  3.8  --    GFR: Estimated Creatinine Clearance: 106.7 mL/min (by C-G formula based on SCr of 0.7 mg/dL). Liver Function Tests: Recent Labs  Lab 02/20/24 0019 02/21/24 0749 02/23/24 0728  AST 48* 41 48*  ALT 26 22 27   ALKPHOS 102 103 100  BILITOT 1.0 0.6 0.3  PROT 8.8* 6.9 7.4  ALBUMIN  4.1 3.3* 3.3*   No results for input(s): "LIPASE", "AMYLASE" in the last 168 hours. No results for input(s): "AMMONIA" in the last 168 hours. Coagulation Profile: No results for input(s): "INR", "PROTIME" in the last 168 hours. Cardiac Enzymes: No results for input(s): "CKTOTAL", "CKMB", "CKMBINDEX", "TROPONINI" in the last 168 hours. BNP (last 3 results) No results for input(s): "PROBNP" in the last 8760 hours. HbA1C: No results for input(s): "HGBA1C" in the last 72 hours. CBG: Recent Labs  Lab 02/19/24 2120  GLUCAP 114*   Lipid Profile: No results for input(s): "CHOL", "HDL", "LDLCALC", "TRIG", "CHOLHDL", "LDLDIRECT" in the last 72 hours. Thyroid  Function Tests: No results for input(s): "TSH", "T4TOTAL", "FREET4", "T3FREE", "THYROIDAB" in the last 72 hours. Anemia Panel: No results for input(s): "VITAMINB12", "FOLATE", "FERRITIN", "TIBC", "IRON", "RETICCTPCT" in  the last 72 hours. Sepsis Labs: Recent Labs  Lab 02/20/24 0028 02/20/24 0906  LATICACIDVEN 2.0* 1.6    Recent Results (from the past 240 hours)  Culture, blood (Routine X 2) w Reflex to ID Panel     Status: None   Collection Time: 02/20/24  7:37 AM   Specimen: BLOOD LEFT HAND  Result Value Ref  Range Status   Specimen Description BLOOD LEFT HAND  Final   Special Requests   Final    BOTTLES DRAWN AEROBIC AND ANAEROBIC Blood Culture adequate volume   Culture   Final    NO GROWTH 5 DAYS Performed at [facility], [address], [place], [place] [address]    Report Status 02/25/2024 FINAL  Final  Culture, blood (Routine X 2) w Reflex to ID Panel     Status: None   Collection Time: 02/20/24  7:45 AM   Specimen: BLOOD LEFT HAND  Result Value Ref Range Status   Specimen Description BLOOD LEFT HAND  Final   Special Requests   Final    BOTTLES DRAWN AEROBIC AND ANAEROBIC Blood Culture adequate volume   Culture   Final    NO GROWTH 5 DAYS Performed at [facility], [address] [place], [place] [address]    Report Status 02/25/2024 FINAL  Final  Surgical pcr screen     Status: None   Collection Time: 02/23/24 12:04 PM   Specimen: Nasal Mucosa; Nasal Swab  Result Value Ref Range Status   MRSA, PCR NEGATIVE NEGATIVE Final   Staphylococcus aureus NEGATIVE NEGATIVE Final    Comment: (NOTE) The Xpert SA Assay (FDA approved for NASAL specimens in patients 7 years of age and older), is one component of a comprehensive surveillance program. It is not intended to diagnose infection nor to guide or monitor treatment. Performed at [facility], [address], [place], [place] [address]   Aerobic/Anaerobic Culture w Gram Stain (surgical/deep wound)     Status: None (Preliminary result)   Collection Time: 02/23/24  2:34 PM   Specimen: Path Tissue  Result Value Ref Range Status   Specimen Description TISSUE  Final   Special Requests left hip fluid  Final   Gram  Stain NO WBC SEEN NO ORGANISMS SEEN   Final   Culture   Final    NO GROWTH 3 DAYS NO ANAEROBES ISOLATED; CULTURE IN PROGRESS FOR 5 DAYS Performed at [facility], [address] [place], [place] [address]    Report Status PENDING  Incomplete  Aerobic/Anaerobic Culture w Gram Stain (surgical/deep wound)     Status: None (Preliminary result)   Collection Time: 02/23/24  2:49 PM   Specimen: Path fluid; Body Fluid  Result Value Ref Range Status   Specimen Description SYNOVIAL LEFT HIP TISSUE  Final   Special Requests SAMPLE B  Final   Gram Stain NO WBC SEEN NO ORGANISMS SEEN   Final   Culture   Final    NO GROWTH 3 DAYS NO ANAEROBES ISOLATED; CULTURE IN PROGRESS FOR 5 DAYS Performed at [facility], [address], [place], [place] [address]    Report Status PENDING  Incomplete         Radiology Studies: No results found.         LOS: 6 days   Time spent= 35 mins    Magdalene School, MD [facility]  If 7PM-7AM, please contact night-coverage  02/26/2024, 4:54 PM

## 2024-02-26 NOTE — Progress Notes (Signed)
 Mobility Specialist: Progress Note   02/26/24 1438  Mobility  Activity Ambulated with assistance in hallway  Level of Assistance Contact guard assist, steadying assist  Assistive Device Front wheel walker  Distance Ambulated (ft) 45 ft  LLE Weight Bearing Per Provider Order TWB  Activity Response Tolerated well  Mobility Referral Yes  Mobility visit 1 Mobility  Mobility Specialist Start Time (ACUTE ONLY) 1310  Mobility Specialist Stop Time (ACUTE ONLY) 1330  Mobility Specialist Time Calculation (min) (ACUTE ONLY) 20 min    Pt received in bed, agreeable to mobility session. MinA for bed mobility to assist BLEs back on to bed, SV for supine>sit. Pt offloads L hip while sitting EOB d/t pain from WB. Educated on hip and WB precautions; pt unable to ambulate with TWB at this time d/t pain and opted to do NWB this session. Ambulated a few feet into the hallway with CG. Left in bed with a blanket between his knees. All needs met, call bell in reach.   Deloria Fetch Mobility Specialist Please contact via SecureChat or Rehab office at [phone]

## 2024-02-27 ENCOUNTER — Encounter (HOSPITAL_COMMUNITY): Payer: Self-pay | Admitting: Orthopaedic Surgery

## 2024-02-27 DIAGNOSIS — F1920 Other psychoactive substance dependence, uncomplicated: Secondary | ICD-10-CM

## 2024-02-27 DIAGNOSIS — F1120 Opioid dependence, uncomplicated: Secondary | ICD-10-CM

## 2024-02-27 DIAGNOSIS — M86152 Other acute osteomyelitis, left femur: Secondary | ICD-10-CM | POA: Diagnosis not present

## 2024-02-27 DIAGNOSIS — L03116 Cellulitis of left lower limb: Secondary | ICD-10-CM | POA: Diagnosis not present

## 2024-02-27 DIAGNOSIS — R7401 Elevation of levels of liver transaminase levels: Secondary | ICD-10-CM

## 2024-02-27 DIAGNOSIS — M869 Osteomyelitis, unspecified: Secondary | ICD-10-CM | POA: Diagnosis not present

## 2024-02-27 DIAGNOSIS — D638 Anemia in other chronic diseases classified elsewhere: Secondary | ICD-10-CM | POA: Diagnosis not present

## 2024-02-27 LAB — CBC
HCT: 28 % — ABNORMAL LOW (ref 39.0–52.0)
Hemoglobin: 9.1 g/dL — ABNORMAL LOW (ref 13.0–17.0)
MCH: 29.3 pg (ref 26.0–34.0)
MCHC: 32.5 g/dL (ref 30.0–36.0)
MCV: 90 fL (ref 80.0–100.0)
Platelets: 344 10*3/uL (ref 150–400)
RBC: 3.11 MIL/uL — ABNORMAL LOW (ref 4.22–5.81)
RDW: 15.1 % (ref 11.5–15.5)
WBC: 7.6 10*3/uL (ref 4.0–10.5)
nRBC: 0 % (ref 0.0–0.2)

## 2024-02-27 LAB — BASIC METABOLIC PANEL WITH GFR
Anion gap: 10 (ref 5–15)
BUN: 8 mg/dL (ref 6–20)
CO2: 27 mmol/L (ref 22–32)
Calcium: 9.4 mg/dL (ref 8.9–10.3)
Chloride: 99 mmol/L (ref 98–111)
Creatinine, Ser: 0.54 mg/dL — ABNORMAL LOW (ref 0.61–1.24)
GFR, Estimated: >60 mL/min (ref >60)
Glucose, Bld: 101 mg/dL — ABNORMAL HIGH (ref 70–99)
Potassium: 3.9 mmol/L (ref 3.5–5.1)
Sodium: 136 mmol/L (ref 135–145)

## 2024-02-27 NOTE — Progress Notes (Signed)
 Occupational Therapy Treatment Patient Details Name: Mark Hammond MRN: [id number] DOB: Feb 20, 1991 Today's Date: 02/27/2024   History of present illness Pt is a 33 y/o male presenting with ongoing L lower extremity pain in setting of MSSA, cellulitis, osteomyelitis and septic arthritis.Underwent resection of left femoral head, placement of antibiotic hip spacer (posterior approach) on 5/23 w/ I&D of L hip abscess and application of wound vac. Recent admission 4/30-5/16 for osteomyelitis and cellulitis of LLE w/ hip aspiration and discharged on PO antibiotics. ED visit 5/19 for OD. PMH: lcoholic gastritis, BPD, Depression, drug abuse, ETOH abuse, hallucination, schizo affective schizophrenia.   OT comments  Pt c/o significant pain to L hip with activity. Pt able to perform bed mobility with mod I, increased time due to pain, able to maintain precautions. Pt needs mod A for LB dressing, instructed on use of sock aide/reacher but unable to don/doff LLE sock/shoe due to pain. Pt CGA for mobility with RW, ambulated 50 feet with increased time, able to maintain TDWB precautions. Pt would benefit from continued acute OT to maximize independence with ADLs, no OT follow up needed.       If plan is discharge home, recommend the following:  Assistance with cooking/housework;Direct supervision/assist for medications management;Assist for transportation;Help with stairs or ramp for entrance;A little help with bathing/dressing/bathroom   Equipment Recommendations  Other (comment) (RW)    Recommendations for Other Services      Precautions / Restrictions Precautions Precautions: Fall;Posterior Hip Precaution Booklet Issued: Yes (comment) Recall of Precautions/Restrictions: Impaired Precaution/Restrictions Comments: Pt able to verbalize 1/3 precautions Restrictions Weight Bearing Restrictions Per Provider Order: Yes LLE Weight Bearing Per Provider Order: Touchdown weight bearing       Mobility Bed  Mobility Overal bed mobility: Needs Assistance Bed Mobility: Sidelying to Sit, Sit to Sidelying   Sidelying to sit: Modified independent (Device/Increase time), HOB elevated, Used rails     Sit to sidelying: Min assist, HOB elevated, Used rails General bed mobility comments: min A to assist return to bed for LLE, able to sit up using RLE to assist LLE to sit EOB.    Transfers Overall transfer level: Needs assistance Equipment used: Rolling walker (2 wheels) Transfers: Sit to/from Stand, Bed to chair/wheelchair/BSC Sit to Stand: Contact guard assist     Step pivot transfers: Contact guard assist     General transfer comment: CGA using RW     Balance Overall balance assessment: Needs assistance Sitting-balance support: No upper extremity supported, Feet supported Sitting balance-Leahy Scale: Fair Sitting balance - Comments: sits EOB with pressure of L bottom due to pain Postural control: Right lateral lean Standing balance support: During functional activity Standing balance-Leahy Scale: Fair Standing balance comment: Can stand on RLE without LOB, Lt toe touching.                           ADL either performed or assessed with clinical judgement   ADL Overall ADL's : Needs assistance/impaired Eating/Feeding: Independent   Grooming: Set up;Sitting           Upper Body Dressing : Set up;Sitting   Lower Body Dressing: Moderate assistance;Sitting/lateral leans;Sit to/from stand   Toilet Transfer: Ambulation;Contact guard assist   Toileting- Clothing Manipulation and Hygiene: Supervision/safety;Sit to/from stand;Sitting/lateral lean       Functional mobility during ADLs: Contact guard assist;Rolling walker (2 wheels) General ADL Comments: Pt set up/CGA for most ADLs, needs mod A for LB dressing/bathing due to hip precautions. instructed  on use of sock aide/reacher but unable to don LLE sock due to pain when moving LLE.    Extremity/Trunk Assessment Upper  Extremity Assessment Upper Extremity Assessment: Overall WFL for tasks assessed            Vision       Perception     Praxis     Communication Communication Communication: No apparent difficulties   Cognition Arousal: Alert Behavior During Therapy: WFL for tasks assessed/performed Cognition: No apparent impairments             OT - Cognition Comments: Pt able to fully participate, pleasant and conversational, recited 1/3 precautions.                 Following commands: Intact        Cueing   Cueing Techniques: Verbal cues  Exercises      Shoulder Instructions       General Comments      Pertinent Vitals/ Pain       Pain Assessment Pain Assessment: Faces Faces Pain Scale: Hurts whole lot Pain Location: lt hip Pain Descriptors / Indicators: Moaning, Guarding, Grimacing Pain Intervention(s): Monitored during session, Limited activity within patient's tolerance, Patient requesting pain meds-RN notified  Home Living                                          Prior Functioning/Environment              Frequency  Min 2X/week        Progress Toward Goals  OT Goals(current goals can now be found in the care plan section)     Acute Rehab OT Goals Patient Stated Goal: to manage pain OT Goal Formulation: With patient Time For Goal Achievement: 03/09/24 Potential to Achieve Goals: Good  Plan      Co-evaluation                 AM-PAC OT "6 Clicks" Daily Activity     Outcome Measure   Help from another person eating meals?: None Help from another person taking care of personal grooming?: A Little Help from another person toileting, which includes using toliet, bedpan, or urinal?: A Little Help from another person bathing (including washing, rinsing, drying)?: A Little Help from another person to put on and taking off regular upper body clothing?: A Little Help from another person to put on and taking off  regular lower body clothing?: A Lot 6 Click Score: 18    End of Session Equipment Utilized During Treatment: Rolling walker (2 wheels)  OT Visit Diagnosis: Unsteadiness on feet (R26.81);Other abnormalities of gait and mobility (R26.89);Muscle weakness (generalized) (M62.81)   Activity Tolerance Patient tolerated treatment well   Patient Left in bed;with call bell/phone within reach;with bed alarm set   Nurse Communication Mobility status        Time: 1610-9604 OT Time Calculation (min): 25 min  Charges: OT General Charges $OT Visit: 1 Visit OT Treatments $Self Care/Home Management : 8-22 mins $Therapeutic Activity: 8-22 mins  Liana Camerer, OTR/L   Scherry Curtis 02/27/2024, 12:59 PM

## 2024-02-27 NOTE — Progress Notes (Signed)
 Physical Therapy Treatment Patient Details Name: Mark Hammond MRN: [id number] DOB: 1991/09/16 Today's Date: 02/27/2024   History of Present Illness Pt is a 33 y/o male presenting with ongoing L lower extremity pain in setting of MSSA, cellulitis, osteomyelitis and septic arthritis. Underwent resection of left femoral head, placement of antibiotic hip spacer (posterior approach) on 5/23 w/ I&D of L hip abscess and application of wound vac. Recent admission 4/30-5/16 for osteomyelitis and cellulitis of LLE w/ hip aspiration and discharged on PO antibiotics. ED visit 5/19 for OD. PMH: lcoholic gastritis, BPD, Depression, drug abuse, ETOH abuse, hallucination, schizo affective schizophrenia.    PT Comments  Pt received in R sidelying, pt c/o fatigue and reluctant to mobilize as he just had pain meds and wanting to rest. After discussion, pt agreeable to bed level session for education on L posterior hip precautions including modification of resting posture to better accommodate LLE support to maintain his precautions and LLE HEP for sidelying vs supine ROM as tolerated within his precs. Also requesting nursing unit staff obtain him a recliner chair for his room and pressure relief cushion for his chair, discussed with Licensed conveyancer. Pt continues to benefit from PT services to progress toward functional mobility goals, pt currently difficult to place and would benefit from support from a family or friend upon DC due to precautions and mobility limitations.     If plan is discharge home, recommend the following: Assist for transportation   Can travel by private vehicle        Equipment Recommendations  Rolling walker (2 wheels);Wheelchair (measurements PT);Wheelchair cushion (measurements PT)    Recommendations for Other Services       Precautions / Restrictions Precautions Precautions: Fall;Posterior Hip Precaution Booklet Issued: Yes (comment) Recall of Precautions/Restrictions:  Impaired Precaution/Restrictions Comments: Pt able to verbalize 2/3 precs with some hints Required Braces or Orthoses: Other Brace Other Brace: hip abd pillow in room if pt able to tolerate supine position Restrictions Weight Bearing Restrictions Per Provider Order: Yes LLE Weight Bearing Per Provider Order: Touchdown weight bearing     Mobility  Bed Mobility Overal bed mobility: Needs Assistance             General bed mobility comments: pt defers, only agreeable to minimal repositioning in sidelying    Transfers                   General transfer comment: pt defers    Ambulation/Gait                   Stairs             Wheelchair Mobility     Tilt Bed    Modified Rankin (Stroke Patients Only)       Balance Overall balance assessment: Needs assistance Sitting-balance support: No upper extremity supported, Feet supported   Sitting balance - Comments: pt defers       Standing balance comment: pt defers                            Communication Communication Communication: No apparent difficulties  Cognition Arousal: Alert Behavior During Therapy: WFL for tasks assessed/performed                           PT - Cognition Comments: Pt with slightly improved recall of precs but needs reminders while mobilizing. Pt very limited participation today due  to c/o pain and just had pain meds, and fatigue after working with OT. Discussed with pt attempting to try getting OOB to chair following date if a recliner can be located for his room (none in room during session). Discussed getting him a geo pressure relief cushion to put in his chair for comfort, pt agreeable. Following commands: Intact      Cueing Cueing Techniques: Verbal cues  Exercises Other Exercises Other Exercises: Reviewed AROM ankle DF/PF in sidelying (when limb supported with pillow) and knee flex/ext in sidelying if not internally rotating at the hip,  handout provided to reinforce these along with supine SAQ on LLE and supine QS/GS if pt able to tolerate supine posture (pt currently defers to try)    General Comments General comments (skin integrity, edema, etc.): pt assisted to reposition LLE with another pillow so it is better supported and to prevent increased hip IR or adduction, pt receptive to this. Also placed pillow behind his back between rail and pt back to prevent accidental rolling on his back while pt resting      Pertinent Vitals/Pain Pain Assessment Pain Assessment: Faces Faces Pain Scale: Hurts little more Pain Location: lt hip at rest (pt defers EOB/OOB) Pain Descriptors / Indicators: Guarding, Grimacing Pain Intervention(s): Limited activity within patient's tolerance, Monitored during session, Premedicated before session, Repositioned    Home Living                          Prior Function            PT Goals (current goals can now be found in the care plan section) Acute Rehab PT Goals Patient Stated Goal: decr pain PT Goal Formulation: With patient Time For Goal Achievement: 03/10/24 Progress towards PT goals: Progressing toward goals    Frequency    Min 2X/week      PT Plan      Co-evaluation              AM-PAC PT "6 Clicks" Mobility   Outcome Measure  Help needed turning from your back to your side while in a flat bed without using bedrails?: None Help needed moving from lying on your back to sitting on the side of a flat bed without using bedrails?: None Help needed moving to and from a bed to a chair (including a wheelchair)?: A Little Help needed standing up from a chair using your arms (e.g., wheelchair or bedside chair)?: A Little Help needed to walk in hospital room?: A Little Help needed climbing 3-5 steps with a railing? : Total 6 Click Score: 18    End of Session   Activity Tolerance: Patient limited by pain;Patient limited by fatigue Patient left: in bed;with  call bell/phone within reach;with bed alarm set;Other (comment) (sidelying toward his R) Nurse Communication: Mobility status;Other (comment) (pt needs geomat chair cusion and recliner for his room Training and development officer asked to get a chair ordered for him, none in room)) PT Visit Diagnosis: Other abnormalities of gait and mobility (R26.89);Pain Pain - Right/Left: Left Pain - part of body: Hip     Time: 8295-6213 PT Time Calculation (min) (ACUTE ONLY): 17 min  Charges:    $Therapeutic Exercise: 8-22 mins PT General Charges $$ ACUTE PT VISIT: 1 Visit                     Kahlyn Shippey P., PTA Acute Rehabilitation Services Secure Chat Preferred 9a-5:30pm Office: [phone]  Mariel Shope Dorsey Authement 02/27/2024, 7:07 PM

## 2024-02-27 NOTE — Progress Notes (Signed)
 PROGRESS NOTE    Mark Hammond  NWG:[id number] DOB: 03/03/1991 DOA: 02/19/2024 PCP: Patient, No Pcp Per    Hospital course: 33 year old M polysubstance use/IVDU was recently discharged on 5/16 after treatment for septic left hip arthritis.  Patient was readmitted with cellulitis of the left foot ankle.  Patient underwent left hip arthroplasty for chronic septic arthritis and osteomyelitis with collapse of femoral head with failed IR drainage and outpatient antibiotic treatment.  He is presently getting treatment for foot cellulitis.  5/27: Hemodynamically stable, will be difficult to place for SNF with history of polysubstance abuse and on Medicaid.  Patient wants to go to rehab for his substance abuse.  Discussed with ID and will continue antibiotics for total of 6 weeks.  Subjective: Patient continued to have pain with ambulation.  Asking to help him find a substance abuse rehab place  Exam: General.  Malnourished gentleman, in no acute distress. Pulmonary.  Lungs clear bilaterally, normal respiratory effort. CV.  Regular rate and rhythm, no JVD, rub or murmur. Abdomen.  Soft, nontender, nondistended, BS positive. CNS.  Alert and oriented .  No focal neurologic deficit. Extremities.  No edema, no cyanosis, pulses intact and symmetrical. Psychiatry.  Judgment and insight appears normal.   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  Decreasing H&H Appropriate increase in Hg after tranfusion yesterday, now 9.1 up from 6.9 Patient is hemodynamically stable  Hypomagnesemia Normalized with repletion   Left foot cellulitis Continue cefazolin  today #7  Bipolar disorder Continue Seroquel   Respiratory failure secondary polysubstance abuse/heroin overdose--now resolved Presently on  room air with no shortness of breath   DVT prophylaxis: Aspirin 81 twice daily per orthopedics Code Status: Full Family Communication: None today Disposition: TBD--TOC actively working on this, patient is homeless and appears to have a very low pain threshold.  Not sure how well he would rehab since he is not eligible to go to SNF and has a reluctance to move around due to pain..   Diet Orders (From admission, onward)     Start     Ordered   02/23/24 2029  Diet regular Room service appropriate? Yes; Fluid consistency: Thin  Diet effective now       Question Answer Comment  Room service appropriate? Yes   Fluid consistency: Thin      02/23/24 2028            Objective: Vitals:   02/26/24 2032 02/27/24 0506 02/27/24 0826 02/27/24 1236  BP: 138/67 110/74 114/66 120/84  Pulse: 83 76 78 80  Resp: 18 18 17 17   Temp: 98.4 F (36.9 C) 97.9 F (36.6 C) 98.1 F (36.7 C) 98.2 F (36.8 C)  TempSrc:      SpO2: 100% 100% 100% 100%  Weight:      Height:        Intake/Output Summary (Last 24 hours) at 02/27/2024 1533 Last data filed at 02/27/2024 1200 Gross per 24 hour  Intake --  Output 500 ml  Net -500 ml   Filed Weights   02/20/24 0010 02/23/24 1155  Weight: 56.3 kg 59 kg    Scheduled Meds:  aspirin EC  81 mg Oral BID   docusate sodium   100 mg Oral BID   folic acid   1 mg Oral Daily   magnesium  oxide  400 mg  Oral BID   multivitamin with minerals  1 tablet Oral Daily   nicotine   14 mg Transdermal Daily   nystatin    Topical TID   QUEtiapine   50 mg Oral QHS   thiamine   100 mg Oral Daily   Continuous Infusions:   ceFAZolin  (ANCEF ) IV 2 g (02/27/24 1303)    Nutritional status     Body mass index is 23.03 kg/m.  Data Reviewed:   CBC: Recent Labs  Lab 02/24/24 0802 02/25/24 0729 02/25/24 1624 02/26/24 0531 02/27/24 0455  WBC 13.8* 6.2 6.4 6.2 7.6  HGB 7.6* 6.9* 7.9* 8.2* 9.1*  HCT 22.5* 21.3* 24.2* 25.0* 28.0*  MCV 88.9 90.6 89.3 89.6 90.0  PLT 429*  266 263 267 344   Basic Metabolic Panel: Recent Labs  Lab 02/21/24 0749 02/22/24 0631 02/23/24 0728 02/24/24 0802 02/26/24 0531 02/27/24 0455  NA 136 141 137 137  --  136  K 3.0* 3.5 3.6 3.6  --  3.9  CL 96* 103 102 101  --  99  CO2 29 28 28 26   --  27  GLUCOSE 114* 93 99 184*  --  101*  BUN 6 <5* 7 9  --  8  CREATININE 0.54* 0.54* 0.51* 0.70  --  0.54*  CALCIUM 9.0 9.3 9.2 9.0  --  9.4  MG 1.6* 1.9 1.8 1.6* 2.2  --   PHOS  --   --   --  3.8  --   --    GFR: Estimated Creatinine Clearance: 106.7 mL/min (A) (by C-G formula based on SCr of 0.54 mg/dL (L)). Liver Function Tests: Recent Labs  Lab 02/21/24 0749 02/23/24 0728  AST 41 48*  ALT 22 27  ALKPHOS 103 100  BILITOT 0.6 0.3  PROT 6.9 7.4  ALBUMIN  3.3* 3.3*   No results for input(s): "LIPASE", "AMYLASE" in the last 168 hours. No results for input(s): "AMMONIA" in the last 168 hours. Coagulation Profile: No results for input(s): "INR", "PROTIME" in the last 168 hours. Cardiac Enzymes: No results for input(s): "CKTOTAL", "CKMB", "CKMBINDEX", "TROPONINI" in the last 168 hours. BNP (last 3 results) No results for input(s): "PROBNP" in the last 8760 hours. HbA1C: No results for input(s): "HGBA1C" in the last 72 hours. CBG: No results for input(s): "GLUCAP" in the last 168 hours.  Lipid Profile: No results for input(s): "CHOL", "HDL", "LDLCALC", "TRIG", "CHOLHDL", "LDLDIRECT" in the last 72 hours. Thyroid  Function Tests: No results for input(s): "TSH", "T4TOTAL", "FREET4", "T3FREE", "THYROIDAB" in the last 72 hours. Anemia Panel: No results for input(s): "VITAMINB12", "FOLATE", "FERRITIN", "TIBC", "IRON", "RETICCTPCT" in the last 72 hours. Sepsis Labs: No results for input(s): "PROCALCITON", "LATICACIDVEN" in the last 168 hours.   Recent Results (from the past 240 hours)  Culture, blood (Routine X 2) w Reflex to ID Panel     Status: None   Collection Time: 02/20/24  7:37 AM   Specimen: BLOOD LEFT HAND  Result  Value Ref Range Status   Specimen Description BLOOD LEFT HAND  Final   Special Requests   Final    BOTTLES DRAWN AEROBIC AND ANAEROBIC Blood Culture adequate volume   Culture   Final    NO GROWTH 5 DAYS Performed at [facility], [address] [place], [place] [address]    Report Status 02/25/2024 FINAL  Final  Culture, blood (Routine X 2) w Reflex to ID Panel     Status: None   Collection Time: 02/20/24  7:45 AM   Specimen:  BLOOD LEFT HAND  Result Value Ref Range Status   Specimen Description BLOOD LEFT HAND  Final   Special Requests   Final    BOTTLES DRAWN AEROBIC AND ANAEROBIC Blood Culture adequate volume   Culture   Final    NO GROWTH 5 DAYS Performed at [facility], [address], [place], [place] [address]    Report Status 02/25/2024 FINAL  Final  Surgical pcr screen     Status: None   Collection Time: 02/23/24 12:04 PM   Specimen: Nasal Mucosa; Nasal Swab  Result Value Ref Range Status   MRSA, PCR NEGATIVE NEGATIVE Final   Staphylococcus aureus NEGATIVE NEGATIVE Final    Comment: (NOTE) The Xpert SA Assay (FDA approved for NASAL specimens in patients 41 years of age and older), is one component of a comprehensive surveillance program. It is not intended to diagnose infection nor to guide or monitor treatment. Performed at [facility], [address] [place], [place] [address]   Aerobic/Anaerobic Culture w Gram Stain (surgical/deep wound)     Status: None (Preliminary result)   Collection Time: 02/23/24  2:34 PM   Specimen: Path Tissue  Result Value Ref Range Status   Specimen Description TISSUE  Final   Special Requests left hip fluid  Final   Gram Stain NO WBC SEEN NO ORGANISMS SEEN   Final   Culture   Final    NO GROWTH 4 DAYS NO ANAEROBES ISOLATED; CULTURE IN PROGRESS FOR 5 DAYS Performed at [facility], [address], [place], [place] [address]    Report Status PENDING  Incomplete  Aerobic/Anaerobic Culture w Gram Stain  (surgical/deep wound)     Status: None (Preliminary result)   Collection Time: 02/23/24  2:49 PM   Specimen: Path fluid; Body Fluid  Result Value Ref Range Status   Specimen Description SYNOVIAL LEFT HIP TISSUE  Final   Special Requests SAMPLE B  Final   Gram Stain NO WBC SEEN NO ORGANISMS SEEN   Final   Culture   Final    NO GROWTH 4 DAYS NO ANAEROBES ISOLATED; CULTURE IN PROGRESS FOR 5 DAYS Performed at [facility], [address], [place], [place] [address]    Report Status PENDING  Incomplete     Radiology Studies: No results found.    LOS: 7 days   Time spent= 45 mins  This record has been created using Conservation officer, historic buildings. Errors have been sought and corrected,but may not always be located. Such creation errors do not reflect on the standard of care.   Luna Salinas, MD [facility]  If 7PM-7AM, please contact night-coverage  02/27/2024, 3:33 PM

## 2024-02-27 NOTE — TOC Progression Note (Signed)
 Transition of Care [facility]) - Progression Note    Patient Details  Name: Mark Hammond MRN: [id number] Date of Birth: [birth year]  [facility] CM/SW Contact  Dane Dung, RN Phone Number: 02/27/2024, 9:12 AM  Clinical Narrative:    CM called and left a voicemail message with patient's [employer] SW - Marvin Locklear [phone],  I called and spoke with [facility] and they are unable to offer a bed to the patient due to patient's payor source.  I called and left a voicemail message with [facility] with Warren Haber, SW [phone].        Expected Discharge Plan and Services                                               Social Determinants of Health (SDOH) Interventions SDOH Screenings   Food Insecurity: Food Insecurity Present (02/20/2024)  Housing: High Risk (02/20/2024)  Transportation Needs: Unmet Transportation Needs (02/20/2024)  Utilities: At Risk (02/20/2024)  Alcohol Screen: High Risk (02/23/2023)  Depression (PHQ2-9): Low Risk  (01/17/2024)  Recent Concern: Depression (PHQ2-9) - High Risk (01/14/2024)  Social Connections: Unknown (02/20/2024)  Tobacco Use: High Risk (02/23/2024)    Readmission Risk Interventions    02/26/2024    4:21 PM 02/20/2024    3:21 PM 02/02/2024   11:06 AM  Readmission Risk Prevention Plan  Transportation Screening Complete Complete Complete  Medication Review Oceanographer) Complete Complete Complete  PCP or Specialist appointment within 3-5 days of discharge Complete Complete Complete  HRI or Home Care Consult Complete Complete Complete  SW Recovery Care/Counseling Consult Not Complete Complete Complete  Palliative Care Screening Not Applicable Not Applicable Not Applicable  Skilled Nursing Facility Not Applicable Not Applicable Not Applicable

## 2024-02-27 NOTE — Progress Notes (Signed)
 [facility]  Date of Admission:  02/19/2024       Abx: 5/19-c cefazolin    ASSESSMENT: 33 yo male bipolar, ivdu recent admission with left leg pain on mri showing left hip septic arthritis/femoral head om s/p ir drainage, discharged on cefadroxil  after about 10 days induction iv abx now returned with ongoing left hip, ankle pain unable to bear weight and losing his oral abx   5/20 blood cx negative 5/08 bcx negative 5/05 left hip aspirate cx mssa (s doxy, bactrim)   Discussed repeat mri hip finding with patient. Discussed with ortho Dr Julio Ohm who will discuss with his partner about girdle stone procedure and I&D  Mri hip also suggest left femoral neck pathologic fracture  Left ankle mri appears to be without septic arthritis/om (edema medial malleolus bone reactive vs early om); finding mostly suggest tenosynovitis   --------- 5/27 assessment S/p 5/23 resection left femoral head, placement of abx hip spacer, I&D of left hip abscess/septic arthritis and application incisional wound vac Pain improving No new focal pain Left distal lower ext pain also much better  Will need at least 6 weeks abx therapy for osteomyelitis/septic arthritis  PLAN: Continue iv cefazolin  while in house Starting clock for abx at this time will be 02/23/24; plan 6 weeks Would suggest 2-4 weeks iv cefazolin  here and then transition to oral abx after that on discharge Patient is not an opat candidate Weekly cbc, cmp, crp Please reengage ID toward end of first week of June 2025 Maintain standard isolation precaution Discussed with primary team      Principal Problem:   Osteomyelitis of left hip (HCC) Active Problems:   Polysubstance (including opioids) dependence, daily use (HCC)   Cellulitis of left lower extremity   Anemia of chronic disease   Transaminitis   Cellulitis   Allergies  Allergen Reactions   Codeine Other (See Comments)    Patient states  parents told him he was allergic at a young age.   Fish Allergy Other (See Comments)    Patient does not wish to eat ANY fish- reaction unclear   Shellfish Allergy Other (See Comments)    Patient does not wish to eat ANY shellfish- reaction unclear    Scheduled Meds:  aspirin EC  81 mg Oral BID   docusate sodium   100 mg Oral BID   folic acid   1 mg Oral Daily   magnesium  oxide  400 mg Oral BID   multivitamin with minerals  1 tablet Oral Daily   nicotine   14 mg Transdermal Daily   nystatin    Topical TID   QUEtiapine   50 mg Oral QHS   thiamine   100 mg Oral Daily   Continuous Infusions:   ceFAZolin  (ANCEF ) IV 2 g (02/27/24 1303)   PRN Meds:.acetaminophen , diphenhydrAMINE , HYDROcodone -acetaminophen , HYDROcodone -acetaminophen , hydrOXYzine , magnesium  citrate, methocarbamol  **OR** methocarbamol  (ROBAXIN ) injection, metoCLOPramide **OR** metoCLOPramide (REGLAN) injection, morphine  injection, nicotine  polacrilex, ondansetron  **OR** ondansetron  (ZOFRAN ) IV, polyethylene glycol, sorbitol   SUBJECTIVE: Moderate-severe pain still left hip Afebrile No n/v/diarrhea    Review of Systems: ROS All other ROS was negative, except mentioned above     OBJECTIVE: Vitals:   02/26/24 2032 02/27/24 0506 02/27/24 0826 02/27/24 1236  BP: 138/67 110/74 114/66 120/84  Pulse: 83 76 78 80  Resp: 18 18 17 17   Temp: 98.4 F (36.9 C) 97.9 F (36.6 C) 98.1 F (36.7 C) 98.2 F (36.8 C)  TempSrc:      SpO2:  100% 100% 100% 100%  Weight:      Height:       Body mass index is 23.03 kg/m.  Physical Exam General/constitutional: no distress, pleasant HEENT: Normocephalic, PER, Conj Clear, EOMI, Oropharynx clear Neck supple CV: rrr no mrg Lungs: clear to auscultation, normal respiratory effort Abd: Soft, Nontender Ext: no edema Skin/msk: left hip incision site wound vac functioning; nontender and no erythema distal left lower ext/ankle/foot  Lab Results Lab Results  Component Value Date   WBC  7.6 02/27/2024   HGB 9.1 (L) 02/27/2024   HCT 28.0 (L) 02/27/2024   MCV 90.0 02/27/2024   PLT 344 02/27/2024    Lab Results  Component Value Date   CREATININE 0.54 (L) 02/27/2024   BUN 8 02/27/2024   NA 136 02/27/2024   K 3.9 02/27/2024   CL 99 02/27/2024   CO2 27 02/27/2024    Lab Results  Component Value Date   ALT 27 02/23/2024   AST 48 (H) 02/23/2024   ALKPHOS 100 02/23/2024   BILITOT 0.3 02/23/2024      Microbiology: Recent Results (from the past 240 hours)  Culture, blood (Routine X 2) w Reflex to ID Panel     Status: None   Collection Time: 02/20/24  7:37 AM   Specimen: BLOOD LEFT HAND  Result Value Ref Range Status   Specimen Description BLOOD LEFT HAND  Final   Special Requests   Final    BOTTLES DRAWN AEROBIC AND ANAEROBIC Blood Culture adequate volume   Culture   Final    NO GROWTH 5 DAYS Performed at [facility], [address], [place], [place] [address]    Report Status 02/25/2024 FINAL  Final  Culture, blood (Routine X 2) w Reflex to ID Panel     Status: None   Collection Time: 02/20/24  7:45 AM   Specimen: BLOOD LEFT HAND  Result Value Ref Range Status   Specimen Description BLOOD LEFT HAND  Final   Special Requests   Final    BOTTLES DRAWN AEROBIC AND ANAEROBIC Blood Culture adequate volume   Culture   Final    NO GROWTH 5 DAYS Performed at [facility], [address] [place], [place] [address]    Report Status 02/25/2024 FINAL  Final  Surgical pcr screen     Status: None   Collection Time: 02/23/24 12:04 PM   Specimen: Nasal Mucosa; Nasal Swab  Result Value Ref Range Status   MRSA, PCR NEGATIVE NEGATIVE Final   Staphylococcus aureus NEGATIVE NEGATIVE Final    Comment: (NOTE) The Xpert SA Assay (FDA approved for NASAL specimens in patients 7 years of age and older), is one component of a comprehensive surveillance program. It is not intended to diagnose infection nor to guide or monitor treatment. Performed at [facility], [address] [place], [place] [address]   Aerobic/Anaerobic Culture w Gram Stain (surgical/deep wound)     Status: None (Preliminary result)   Collection Time: 02/23/24  2:34 PM   Specimen: Path Tissue  Result Value Ref Range Status   Specimen Description TISSUE  Final   Special Requests left hip fluid  Final   Gram Stain NO WBC SEEN NO ORGANISMS SEEN   Final   Culture   Final    NO GROWTH 4 DAYS NO ANAEROBES ISOLATED; CULTURE IN PROGRESS FOR 5 DAYS Performed at [facility], [address] [place], [place] [address]    Report Status  PENDING  Incomplete  Aerobic/Anaerobic Culture w Gram Stain (surgical/deep wound)     Status: None (Preliminary result)   Collection Time: 02/23/24  2:49 PM   Specimen: Path fluid; Body Fluid  Result Value Ref Range Status   Specimen Description SYNOVIAL LEFT HIP TISSUE  Final   Special Requests SAMPLE B  Final   Gram Stain NO WBC SEEN NO ORGANISMS SEEN   Final   Culture   Final    NO GROWTH 4 DAYS NO ANAEROBES ISOLATED; CULTURE IN PROGRESS FOR 5 DAYS Performed at [facility], [address], [place], [place] [address]    Report Status PENDING  Incomplete     Serology:   Imaging: If present, new imagings (plain films, ct scans, and mri) have been personally visualized and interpreted; radiology reports have been reviewed. Decision making incorporated into the Impression / Recommendations.  5/21 left ankle mri wwo contrast 1. Mild subcutaneous fat edema and swelling of the dorsal midfoot and lateral ankle. No rim enhancing abscess is seen. 2. Mild posterior tibial tenosynovitis. MRI cannot determine the presence or absence of infection within the tenosynovitis fluid, however no complicating features are seen to specifically suggest infectious tenosynovitis. 3. Mild partial-thickness longitudinal tear of the peroneus brevis tendon starting at the level of the distal fibular epiphysis and involving an approximate  1.5 cm length of the tendon. Minimal peroneus longus and brevis tenosynovitis. 4. Mild marrow edema within the posterior aspect of the medial malleolus, likely stress related/reactive. 5. Mild thinning of the anterior aspect of the mid transverse dimension of the talar dome cartilage with mild subchondral increased T2 signal. No tibiotalar joint effusion.   5/21 mri left hip wwo contrast Compared to 02/03/2024:   1. Persistent high-grade marrow edema and enhancement throughout the left femoral head and neck extending into the greater trochanter. There is moderate erosion of the superior left femoral head which appears mildly worsened from 02/03/2024 MRI. There is a moderate left femoroacetabular joint effusion with moderate synovial thickening, similar to prior. Findings are consistent with left hip septic arthritis and mildly worsening proximal left femoral osteomyelitis. 2. There is linear decreased T1 and increased T2 signal suspicious for a pathologic fracture from the osteomyelitis weakened bone. This nondisplaced fracture extends through the mid to posterior aspect of the proximal left femoral neck to the posterior cortex. In retrospect, the early start of an acute fracture was likely present on 02/03/2024, however this is worsened from prior. 3. There is complex, rim enhancing fluid that is new from prior bordering the anterior aspect of the left femoroacetabular joint. This is concerning for a rim enhancing abscess versus infected ganglion extending anteriorly from the inferior femoroacetabular joint space measuring up to 3.5 cm, new from prior. 4. The prior lobular walled-off abscesses within the deep aspect of the left gluteus minimus and medius muscles on 02/03/2024 are no longer seen. The previously seen abscess at the lateral border of the proximal rectus femoris muscle on 02/03/2024 MRI is no longer visualized. 5. There is moderate edema throughout the left adductor  musculature, slightly improved from 02/03/2024. There is again moderate fluid deep to the left iliacus muscle, as on 02/03/2024.    Jamesetta Mcbride, MD [facility] [phone] pager    02/27/2024, 4:59 PM

## 2024-02-27 NOTE — Plan of Care (Signed)

## 2024-02-28 ENCOUNTER — Encounter (HOSPITAL_COMMUNITY): Payer: Self-pay | Admitting: Orthopaedic Surgery

## 2024-02-28 DIAGNOSIS — L03116 Cellulitis of left lower limb: Secondary | ICD-10-CM | POA: Diagnosis not present

## 2024-02-28 DIAGNOSIS — D638 Anemia in other chronic diseases classified elsewhere: Secondary | ICD-10-CM | POA: Diagnosis not present

## 2024-02-28 DIAGNOSIS — F1120 Opioid dependence, uncomplicated: Secondary | ICD-10-CM | POA: Diagnosis not present

## 2024-02-28 DIAGNOSIS — M869 Osteomyelitis, unspecified: Secondary | ICD-10-CM | POA: Diagnosis not present

## 2024-02-28 LAB — CBC WITH DIFFERENTIAL/PLATELET
Abs Immature Granulocytes: 0.05 10*3/uL (ref 0.00–0.07)
Basophils Absolute: 0.1 10*3/uL (ref 0.0–0.1)
Basophils Relative: 2 %
Eosinophils Absolute: 0.5 10*3/uL (ref 0.0–0.5)
Eosinophils Relative: 8 %
HCT: 27.2 % — ABNORMAL LOW (ref 39.0–52.0)
Hemoglobin: 9.2 g/dL — ABNORMAL LOW (ref 13.0–17.0)
Immature Granulocytes: 1 %
Lymphocytes Relative: 35 %
Lymphs Abs: 2.5 10*3/uL (ref 0.7–4.0)
MCH: 30.2 pg (ref 26.0–34.0)
MCHC: 33.8 g/dL (ref 30.0–36.0)
MCV: 89.2 fL (ref 80.0–100.0)
Monocytes Absolute: 0.8 10*3/uL (ref 0.1–1.0)
Monocytes Relative: 12 %
Neutro Abs: 3 10*3/uL (ref 1.7–7.7)
Neutrophils Relative %: 42 %
Platelets: 333 10*3/uL (ref 150–400)
RBC: 3.05 MIL/uL — ABNORMAL LOW (ref 4.22–5.81)
RDW: 15.3 % (ref 11.5–15.5)
WBC: 7.1 10*3/uL (ref 4.0–10.5)
nRBC: 0 % (ref 0.0–0.2)

## 2024-02-28 LAB — COMPREHENSIVE METABOLIC PANEL WITH GFR
ALT: 23 U/L (ref 0–44)
AST: 51 U/L — ABNORMAL HIGH (ref 15–41)
Albumin: 3.6 g/dL (ref 3.5–5.0)
Alkaline Phosphatase: 113 U/L (ref 38–126)
Anion gap: 8 (ref 5–15)
BUN: 10 mg/dL (ref 6–20)
CO2: 27 mmol/L (ref 22–32)
Calcium: 9.4 mg/dL (ref 8.9–10.3)
Chloride: 100 mmol/L (ref 98–111)
Creatinine, Ser: 0.53 mg/dL — ABNORMAL LOW (ref 0.61–1.24)
GFR, Estimated: >60 mL/min (ref >60)
Glucose, Bld: 102 mg/dL — ABNORMAL HIGH (ref 70–99)
Potassium: 4.3 mmol/L (ref 3.5–5.1)
Sodium: 135 mmol/L (ref 135–145)
Total Bilirubin: 0.6 mg/dL (ref 0.0–1.2)
Total Protein: 7.5 g/dL (ref 6.5–8.1)

## 2024-02-28 LAB — SEDIMENTATION RATE: Sed Rate: 32 mm/h — ABNORMAL HIGH (ref 0–16)

## 2024-02-28 LAB — AEROBIC/ANAEROBIC CULTURE W GRAM STAIN (SURGICAL/DEEP WOUND)
Culture: NO GROWTH
Gram Stain: NONE SEEN
Gram Stain: NONE SEEN

## 2024-02-28 LAB — C-REACTIVE PROTEIN: CRP: 0.7 mg/dL (ref <1.0)

## 2024-02-28 NOTE — Progress Notes (Signed)
 Physical Therapy Treatment Patient Details Name: Mark Hammond MRN: [id number] DOB: 11/12/1990 Today's Date: 02/28/2024   History of Present Illness Pt is a 33 y/o male presenting with ongoing L lower extremity pain in setting of MSSA, cellulitis, osteomyelitis and septic arthritis. Underwent resection of left femoral head, placement of antibiotic hip spacer (posterior approach) on 5/23 w/ I&D of L hip abscess and application of wound vac. Recent admission 4/30-5/16 for osteomyelitis and cellulitis of LLE w/ hip aspiration and discharged on PO antibiotics. ED visit 5/19 for OD. PMH: lcoholic gastritis, BPD, Depression, drug abuse, ETOH abuse, hallucination, schizo affective schizophrenia.    PT Comments  Pt received in sidelying to his R after RN premedicated him with IV pain meds. Pt with much improved activity tolerance this date with chair follow for safety for household distance gait trial. Pt able to tolerate sitting in chair with pressure relief cushion in place and mobility specialist agreeable to assist him back to bed in <30 mins per pt request on "building tolerance" for sitting. Pt needing up to CGA for safety to perform functional mobility tasks. Pt continues to benefit from PT services to progress toward functional mobility goals.     If plan is discharge home, recommend the following: Assist for transportation   Can travel by private vehicle        Equipment Recommendations  Rolling walker (2 wheels);Wheelchair (measurements PT);Wheelchair cushion (measurements PT)    Recommendations for Other Services       Precautions / Restrictions Precautions Precautions: Fall;Posterior Hip Precaution Booklet Issued: Yes (comment) Recall of Precautions/Restrictions: Impaired Precaution/Restrictions Comments: Pt able to verbalize 2/3 precautions Required Braces or Orthoses: Other Brace Other Brace: hip abd pillow in room if pt able to tolerate supine position Restrictions Weight Bearing  Restrictions Per Provider Order: Yes LLE Weight Bearing Per Provider Order: Touchdown weight bearing     Mobility  Bed Mobility Overal bed mobility: Needs Assistance Bed Mobility: Sidelying to Sit, Sit to Sidelying   Sidelying to sit: Modified independent (Device/Increase time)       General bed mobility comments: to R EOB, increased time to perform, pt keeping pillow between his knees while advancing hips in sidelying to prevent crossing limbs. Cues to avoid hip IR.    Transfers Overall transfer level: Needs assistance Equipment used: Rolling walker (2 wheels) Transfers: Sit to/from Stand, Bed to chair/wheelchair/BSC Sit to Stand: Contact guard assist           General transfer comment: CGA using RW; cues not to abandon RW prior to sitting and for safe UE placement/LLE placement with stand>sit    Ambulation/Gait Ambulation/Gait assistance: Contact guard assist, +2 safety/equipment (chair follow) Gait Distance (Feet): 50 Feet (x2 with standing break halfway through) Assistive device: Rolling walker (2 wheels) Gait Pattern/deviations: Trunk flexed       General Gait Details: Assist for safety and for VAC. Hop-to pattern with good ability to keep LLE precs, pt not able to tolerate LLE TDWB so maintains L NWB. HR to 130 bpm with exertion, SpO2 poor signal due to grip on RW but once seated SpO2 reading 99% on RA in chair. Cues for pursed-lip breathing/not to hold his breath, a few brief standing breaks and one longer standing break halfway through. chair follow for safety/line assist but pt did not need to sit prior to return to his room.   Stairs             Wheelchair Mobility     Tilt Bed  Modified Rankin (Stroke Patients Only)       Balance Overall balance assessment: Needs assistance Sitting-balance support: No upper extremity supported, Feet supported, Single extremity supported Sitting balance-Leahy Scale: Fair Sitting balance - Comments: prefers UE  support due to L hip pain Postural control: Right lateral lean (due to L hip pain at EOB/in chair) Standing balance support: During functional activity Standing balance-Leahy Scale: Poor Standing balance comment: Can stand on RLE without LOB, Lt toe touching vs NWB with RW needed; poor without AD                            Communication Communication Communication: No apparent difficulties  Cognition Arousal: Alert Behavior During Therapy: WFL for tasks assessed/performed   PT - Cognitive impairments: Safety/Judgement, Problem solving, Sequencing, Memory                       PT - Cognition Comments: Pt with slightly improved recall of precs but needs intermittent reminders while mobilizing. Pt initially reluctant to mobilize due to time of day and pain, but after RN premedication and discussion with PTA/mobility, pt agreeable to attempt and puts forth good effort. Following commands: Intact      Cueing Cueing Techniques: Verbal cues  Exercises Other Exercises Other Exercises: seated LLE AROM: LAQ (partial ROM, pt guarding due to L knee pain), encouraged ankle pumps and knee extension when resting to prevent contracture, pt demos back a few reps.    General Comments General comments (skin integrity, edema, etc.): geomat cushion in his chair for pressure relief, ice pack over L groin/hip      Pertinent Vitals/Pain Pain Assessment Pain Assessment: 0-10 Pain Score: 7  Pain Location: lt hip in sitting, increased pain in L knee and thigh with hopping and bed mobility Pain Descriptors / Indicators: Guarding, Grimacing, Discomfort, Sore, Throbbing Pain Intervention(s): Limited activity within patient's tolerance, Monitored during session, Premedicated before session, Repositioned, Patient requesting pain meds-RN notified, Ice applied    Home Living                          Prior Function            PT Goals (current goals can now be found in the  care plan section) Acute Rehab PT Goals Patient Stated Goal: decr pain and to be more independent so I can go to substance abuse rehab PT Goal Formulation: With patient Time For Goal Achievement: 03/10/24 Progress towards PT goals: Progressing toward goals    Frequency    Min 2X/week      PT Plan      Co-evaluation              AM-PAC PT "6 Clicks" Mobility   Outcome Measure  Help needed turning from your back to your side while in a flat bed without using bedrails?: None Help needed moving from lying on your back to sitting on the side of a flat bed without using bedrails?: A Little Help needed moving to and from a bed to a chair (including a wheelchair)?: A Little Help needed standing up from a chair using your arms (e.g., wheelchair or bedside chair)?: A Little Help needed to walk in hospital room?: A Little Help needed climbing 3-5 steps with a railing? : Total 6 Click Score: 17    End of Session Equipment Utilized During Treatment: Gait belt Activity Tolerance: Patient  tolerated treatment well;Other (comment) (after premedication) Patient left: in chair;with call bell/phone within reach;with chair alarm set Nurse Communication: Mobility status;Patient requests pain meds;Other (comment);Weight bearing status (pt needs grey cord for chair alarm, only has chair pad connected at this time) PT Visit Diagnosis: Other abnormalities of gait and mobility (R26.89);Pain Pain - Right/Left: Left Pain - part of body: Hip     Time: 1610-9604 PT Time Calculation (min) (ACUTE ONLY): 31 min  Charges:    $Gait Training: 8-22 mins $Therapeutic Activity: 8-22 mins PT General Charges $$ ACUTE PT VISIT: 1 Visit                     Obed Samek P., PTA Acute Rehabilitation Services Secure Chat Preferred 9a-5:30pm Office: [phone]    Mariel Shope [facility] 02/28/2024, 10:53 AM

## 2024-02-28 NOTE — Progress Notes (Signed)
 Mobility Specialist: Progress Note   02/28/24 1221  Mobility  Activity Transferred from chair to bed  Level of Assistance Standby assist, set-up cues, supervision of patient - no hands on  Assistive Device Front wheel walker  LLE Weight Bearing Per Provider Order TWB  Activity Response Tolerated well  Mobility Referral Yes  Mobility visit 1 Mobility  Mobility Specialist Start Time (ACUTE ONLY) 1045  Mobility Specialist Stop Time (ACUTE ONLY) 1055  Mobility Specialist Time Calculation (min) (ACUTE ONLY) 10 min    Pt received in chair, requesting assistance to get back to bed. SV for STS and stand pivot to bed. MinA for bed mobility to assist BLEs. C/o L hip pain while backing up to the bed and while transitioning from sit>supine. Left in bed with all needs met, call bell in reach.   Deloria Fetch Mobility Specialist Please contact via SecureChat or Rehab office at [phone]

## 2024-02-28 NOTE — Hospital Course (Addendum)
 33 year old M polysubstance use/IVDU was recently discharged on 5/16 after treatment for septic left hip arthritis.  Patient was readmitted with cellulitis of the left foot ankle.  Patient underwent left hip arthroplasty for chronic septic arthritis and osteomyelitis with collapse of femoral head with failed IR drainage and outpatient antibiotic treatment.  He is presently getting treatment for foot cellulitis.   5/27: Hemodynamically stable, will be difficult to place for SNF with history of polysubstance abuse and on Medicaid.  Patient wants to go to rehab for his substance abuse.  Discussed with ID and will continue antibiotics for total of 6 weeks.   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  Wound vac since Dc'd.  Followed closely infectious disease.  Plan for 6 weeks antibiotic course initiating 5/23.  Initial recommendation for 2-4 weeks of IV cefazolin  with transition to p.o. upon discharge.  Plan to reengage ID toward end of first week of June 2025.    Normocytic anemia - Hemoglobin stable s/p 1 unit PRBCs on 5/27.  No active bleeding appreciated.  Will repeat CBC in am.     Left foot cellulitis - Antibiotics as above.   Bipolar disorder - Seroquel  on board.   Acute hypoxic respiratory failure secondary to opioid overdose - Resolved.  Now on room air.

## 2024-02-28 NOTE — TOC Progression Note (Signed)
 Transition of Care [facility]) - Progression Note    Patient Details  Name: Mark Hammond MRN: [id number] Date of Birth: Mar 21, 1991  [facility] CM/SW Contact  Dane Dung, RN Phone Number: 02/28/2024, 2:40 PM  Clinical Narrative:    CM will continue to follow the patient - per attending MD note - patient will remain inpatient for IV antibiotics for the next 2-4 weeks since patient is not a candidate for OPAT due to recent drug use history/homeless.    CM and MSW will continue to follow the patient needs.        Expected Discharge Plan and Services                                               Social Determinants of Health (SDOH) Interventions SDOH Screenings   Food Insecurity: Food Insecurity Present (02/20/2024)  Housing: High Risk (02/20/2024)  Transportation Needs: Unmet Transportation Needs (02/20/2024)  Utilities: At Risk (02/20/2024)  Alcohol Screen: High Risk (02/23/2023)  Depression (PHQ2-9): Low Risk  (01/17/2024)  Recent Concern: Depression (PHQ2-9) - High Risk (01/14/2024)  Social Connections: Unknown (02/20/2024)  Tobacco Use: High Risk (02/23/2024)    Readmission Risk Interventions    02/26/2024    4:21 PM 02/20/2024    3:21 PM 02/02/2024   11:06 AM  Readmission Risk Prevention Plan  Transportation Screening Complete Complete Complete  Medication Review Oceanographer) Complete Complete Complete  PCP or Specialist appointment within 3-5 days of discharge Complete Complete Complete  HRI or Home Care Consult Complete Complete Complete  SW Recovery Care/Counseling Consult Not Complete Complete Complete  Palliative Care Screening Not Applicable Not Applicable Not Applicable  Skilled Nursing Facility Not Applicable Not Applicable Not Applicable

## 2024-02-28 NOTE — Progress Notes (Signed)
 Progress Note   Patient: Mark Hammond GNF:[id number] DOB: 1991-04-02 DOA: 02/19/2024     8 DOS: the patient was seen and examined on 02/28/2024   Brief hospital course:  33 year old M polysubstance use/IVDU was recently discharged on 5/16 after treatment for septic left hip arthritis.  Patient was readmitted with cellulitis of the left foot ankle.  Patient underwent left hip arthroplasty for chronic septic arthritis and osteomyelitis with collapse of femoral head with failed IR drainage and outpatient antibiotic treatment.  He is presently getting treatment for foot cellulitis.   5/27: Hemodynamically stable, will be difficult to place for SNF with history of polysubstance abuse and on Medicaid.  Patient wants to go to rehab for his substance abuse.  Discussed with ID and will continue antibiotics for total of 6 weeks.  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  Followed closely by Ortho and infectious disease.  Plan for 6 weeks antibiotic course initiating 5/23.  Initial recommendation for 2-4 weeks of IV cefazolin  with transition to p.o. upon discharge.  Normocytic anemia - Hemoglobin trending upward, 9.2 this morning.  S/p 1 unit PRBCs on 5/27.  Left foot cellulitis - Antibiotics as above.  Bipolar disorder - Seroquel  on board.  Acute hypoxic respiratory failure secondary to opioid overdose - Resolved.  Now on room air.   Subjective: Patient resting comfortably.  Admitting to left hip pain which is to be expected.  Denies any shortness of breath, chest pain, nausea, vomiting, abdominal pain.  Physical Exam: Vitals:   02/27/24 1236 02/27/24 2059 02/28/24 0621 02/28/24 0824  BP: 120/84 110/71 113/73 107/62  Pulse: 80 92 87 98  Resp: 17 16 16 16   Temp: 98.2 F (36.8 C) 98.9 F (37.2 C) 98.2 F (36.8 C) 98.5 F (36.9 C)  TempSrc:    Oral   SpO2: 100% 95% 100%   Weight:      Height:        GENERAL:  Alert, pleasant, no acute distress, thin HEENT:  EOMI CARDIOVASCULAR:  RRR, no murmurs appreciated RESPIRATORY:  Clear to auscultation, no wheezing, rales, or rhonchi GASTROINTESTINAL:  Soft, nontender, nondistended EXTREMITIES: Left hip wound VAC NEURO:  No new focal deficits appreciated SKIN:  No rashes noted PSYCH:  Appropriate mood and affect     Data Reviewed:  No new imaging to review  Previous records (including but not limited to H&P, progress notes, nursing notes, TOC management) were reviewed in assessment of this patient.  Labs: CBC: Recent Labs  Lab 02/25/24 0729 02/25/24 1624 02/26/24 0531 02/27/24 0455 02/28/24 0055  WBC 6.2 6.4 6.2 7.6 7.1  NEUTROABS  --   --   --   --  3.0  HGB 6.9* 7.9* 8.2* 9.1* 9.2*  HCT 21.3* 24.2* 25.0* 28.0* 27.2*  MCV 90.6 89.3 89.6 90.0 89.2  PLT 266 263 267 344 333   Basic Metabolic Panel: Recent Labs  Lab 02/22/24 0631 02/23/24 0728 02/24/24 0802 02/26/24 0531 02/27/24 0455 02/28/24 0611  NA 141 137 137  --  136 135  K 3.5 3.6 3.6  --  3.9 4.3  CL 103 102 101  --  99 100  CO2 28 28 26   --  27 27  GLUCOSE 93 99 184*  --  101* 102*  BUN <5* 7 9  --  8 10  CREATININE 0.54* 0.51* 0.70  --  0.54* 0.53*  CALCIUM 9.3 9.2 9.0  --  9.4 9.4  MG 1.9 1.8 1.6* 2.2  --   --   PHOS  --   --  3.8  --   --   --    Liver Function Tests: Recent Labs  Lab 02/23/24 0728 02/28/24 0611  AST 48* 51*  ALT 27 23  ALKPHOS 100 113  BILITOT 0.3 0.6  PROT 7.4 7.5  ALBUMIN  3.3* 3.6   CBG: No results for input(s): "GLUCAP" in the last 168 hours.  Scheduled Meds:  aspirin  EC  81 mg Oral BID   docusate sodium   100 mg Oral BID   folic acid   1 mg Oral Daily   magnesium  oxide  400 mg Oral BID   multivitamin with minerals  1 tablet Oral Daily   nicotine   14 mg Transdermal Daily   nystatin    Topical TID   QUEtiapine   50 mg Oral QHS   thiamine   100 mg Oral Daily    Continuous Infusions:   ceFAZolin  (ANCEF ) IV 2 g (02/28/24 0608)   PRN Meds:.acetaminophen , diphenhydrAMINE , HYDROcodone -acetaminophen , HYDROcodone -acetaminophen , hydrOXYzine , magnesium  citrate, methocarbamol  **OR** methocarbamol  (ROBAXIN ) injection, metoCLOPramide  **OR** metoCLOPramide  (REGLAN ) injection, morphine  injection, nicotine  polacrilex, ondansetron  **OR** ondansetron  (ZOFRAN ) IV, polyethylene glycol, sorbitol   Family Communication: None at bedside  Disposition: Status is: Inpatient Remains inpatient appropriate because: Septic arthritis/osteomyelitis     Time spent: 36 minutes  Length of stay: 8 days  Author: Jodeane Mulligan, DO 02/28/2024 10:54 AM  For on call review [email or web address].

## 2024-02-28 NOTE — Plan of Care (Signed)

## 2024-02-29 DIAGNOSIS — L03116 Cellulitis of left lower limb: Secondary | ICD-10-CM | POA: Diagnosis not present

## 2024-02-29 DIAGNOSIS — M869 Osteomyelitis, unspecified: Secondary | ICD-10-CM | POA: Diagnosis not present

## 2024-02-29 DIAGNOSIS — D638 Anemia in other chronic diseases classified elsewhere: Secondary | ICD-10-CM | POA: Diagnosis not present

## 2024-02-29 LAB — CBC
HCT: 29.4 % — ABNORMAL LOW (ref 39.0–52.0)
Hemoglobin: 9.7 g/dL — ABNORMAL LOW (ref 13.0–17.0)
MCH: 29.6 pg (ref 26.0–34.0)
MCHC: 33 g/dL (ref 30.0–36.0)
MCV: 89.6 fL (ref 80.0–100.0)
Platelets: 418 10*3/uL — ABNORMAL HIGH (ref 150–400)
RBC: 3.28 MIL/uL — ABNORMAL LOW (ref 4.22–5.81)
RDW: 15.1 % (ref 11.5–15.5)
WBC: 7.3 10*3/uL (ref 4.0–10.5)
nRBC: 0 % (ref 0.0–0.2)

## 2024-02-29 LAB — BASIC METABOLIC PANEL WITH GFR
Anion gap: 9 (ref 5–15)
BUN: 11 mg/dL (ref 6–20)
CO2: 24 mmol/L (ref 22–32)
Calcium: 9.1 mg/dL (ref 8.9–10.3)
Chloride: 99 mmol/L (ref 98–111)
Creatinine, Ser: 0.56 mg/dL — ABNORMAL LOW (ref 0.61–1.24)
GFR, Estimated: >60 mL/min (ref >60)
Glucose, Bld: 170 mg/dL — ABNORMAL HIGH (ref 70–99)
Potassium: 3.6 mmol/L (ref 3.5–5.1)
Sodium: 132 mmol/L — ABNORMAL LOW (ref 135–145)

## 2024-02-29 LAB — MAGNESIUM: Magnesium: 1.7 mg/dL (ref 1.7–2.4)

## 2024-02-29 NOTE — Progress Notes (Signed)
 Occupational Therapy Treatment Patient Details Name: Mark Hammond MRN: [id number] DOB: 1991-05-17 Today's Date: 02/29/2024   History of present illness Pt is a 33 y/o male presenting with ongoing L lower extremity pain in setting of MSSA, cellulitis, osteomyelitis and septic arthritis. Underwent resection of left femoral head, placement of antibiotic hip spacer (posterior approach) on 5/23 w/ I&D of L hip abscess and application of wound vac. Recent admission 4/30-5/16 for osteomyelitis and cellulitis of LLE w/ hip aspiration and discharged on PO antibiotics. ED visit 5/19 for OD. PMH: lcoholic gastritis, BPD, Depression, drug abuse, ETOH abuse, hallucination, schizo affective schizophrenia.   OT comments  Pt c/o moderate pain to LLE, reports heard "popping/snapping" earlier when on the toilet and in bed sometimes with moderate pain. Pt eager to participate and perform OOB activities. Pt completed bed mobility without physical assist using RLE to guide LLE safely on/off bed. Pt able to position and scoot along bedside mod I. Pt able to recite 2/3 precautions, able to maintain during activities. Pt ambulated 125 feet with RW at CGA, no rest breaks. Pt continues to do well and improve with mobility, continues to need help with LB dressing to LLE due to pain and weakness. Will continue to see acutely to progress as able.       If plan is discharge home, recommend the following:  Assistance with cooking/housework;Direct supervision/assist for medications management;Assist for transportation;Help with stairs or ramp for entrance;A little help with bathing/dressing/bathroom   Equipment Recommendations  Other (comment) (TBD)    Recommendations for Other Services      Precautions / Restrictions Precautions Precautions: Fall;Posterior Hip Precaution Booklet Issued: Yes (comment) Recall of Precautions/Restrictions: Impaired Precaution/Restrictions Comments: Pt able to verbalize 2/3 precautions Required  Braces or Orthoses: Other Brace Other Brace: hip abd pillow in room if pt able to tolerate supine position Restrictions Weight Bearing Restrictions Per Provider Order: Yes LLE Weight Bearing Per Provider Order: Touchdown weight bearing       Mobility Bed Mobility Overal bed mobility: Needs Assistance Bed Mobility: Supine to Sit, Sit to Supine     Supine to sit: Supervision Sit to supine: Supervision   General bed mobility comments: supervision for safety, Pt uses RLE to lift LLE and maintain safe position for bed mobility    Transfers Overall transfer level: Needs assistance Equipment used: Rolling walker (2 wheels) Transfers: Sit to/from Stand, Bed to chair/wheelchair/BSC Sit to Stand: Contact guard assist     Step pivot transfers: Contact guard assist     General transfer comment: CGA for safety, no LOB, able to stand with one hand supported reaching/leaning for [facility] controls without LOB     Balance Overall balance assessment: Needs assistance Sitting-balance support: No upper extremity supported, Feet supported Sitting balance-Leahy Scale: Good     Standing balance support: Single extremity supported, During functional activity Standing balance-Leahy Scale: Fair Standing balance comment: Pt able to stand  with one hand on RW leaning towards wall to reach items without LOB                           ADL either performed or assessed with clinical judgement   ADL Overall ADL's : Needs assistance/impaired Eating/Feeding: Independent   Grooming: Set up;Sitting       Lower Body Bathing: Minimal assistance;Sitting/lateral leans;Sit to/from stand   Upper Body Dressing : Set up;Sitting   Lower Body Dressing: Moderate assistance;Sitting/lateral leans;Sit to/from stand   Toilet Transfer: Ambulation;Contact guard assist  Toileting- Clothing Manipulation and Hygiene: Supervision/safety;Sit to/from stand;Sitting/lateral lean       Functional mobility  during ADLs: Contact guard assist;Rolling walker (2 wheels) General ADL Comments: Pt doin well, less physical assist needed today than initial eval.    Extremity/Trunk Assessment Upper Extremity Assessment Upper Extremity Assessment: Overall WFL for tasks assessed            Vision       Perception     Praxis     Communication Communication Communication: No apparent difficulties   Cognition Arousal: Alert Behavior During Therapy: WFL for tasks assessed/performed Cognition: No apparent impairments             OT - Cognition Comments: Pt able to fully participate, pleasant and conversational, recited 2/3 precautions.                 Following commands: Intact        Cueing   Cueing Techniques: Verbal cues  Exercises Exercises: Other exercises Other Exercises Other Exercises: generalized BUE green theraband exercises    Shoulder Instructions       General Comments      Pertinent Vitals/ Pain       Pain Assessment Pain Assessment: Faces Faces Pain Scale: Hurts even more Pain Location: lt hip in sitting, increased pain in L knee and thigh with hopping and bed mobility Pain Descriptors / Indicators: Guarding, Grimacing, Discomfort, Sore, Throbbing Pain Intervention(s): Monitored during session  Home Living                                          Prior Functioning/Environment              Frequency  Min 2X/week        Progress Toward Goals  OT Goals(current goals can now be found in the care plan section)  Progress towards OT goals: Progressing toward goals  Acute Rehab OT Goals Patient Stated Goal: to manage pain OT Goal Formulation: With patient Time For Goal Achievement: 03/09/24 Potential to Achieve Goals: Good ADL Goals Pt Will Perform Lower Body Bathing: with modified independence;sit to/from stand;sitting/lateral leans;with adaptive equipment Pt Will Perform Upper Body Dressing: sitting;with contact guard  assist Pt Will Perform Lower Body Dressing: with modified independence;sitting/lateral leans;with adaptive equipment;sit to/from stand Pt Will Transfer to Toilet: with contact guard assist;ambulating;regular height toilet Additional ADL Goal #1: Pt to manage ADLs/mobility > 10 min standing without seated rest break  Plan      Co-evaluation                 AM-PAC OT "6 Clicks" Daily Activity     Outcome Measure   Help from another person eating meals?: None Help from another person taking care of personal grooming?: A Little Help from another person toileting, which includes using toliet, bedpan, or urinal?: A Little Help from another person bathing (including washing, rinsing, drying)?: A Little Help from another person to put on and taking off regular upper body clothing?: A Little Help from another person to put on and taking off regular lower body clothing?: A Lot 6 Click Score: 18    End of Session Equipment Utilized During Treatment: Gait belt;Rolling walker (2 wheels)  OT Visit Diagnosis: Unsteadiness on feet (R26.81);Other abnormalities of gait and mobility (R26.89);Muscle weakness (generalized) (M62.81)   Activity Tolerance Patient tolerated treatment well   Patient Left in  bed;with call bell/phone within reach   Nurse Communication Mobility status        Time: 6433-2951 OT Time Calculation (min): 16 min  Charges: OT General Charges $OT Visit: 1 Visit OT Treatments $Therapeutic Activity: 8-22 mins  [address] OTR/L   Scherry Curtis 02/29/2024, 1:44 PM

## 2024-02-29 NOTE — Plan of Care (Signed)

## 2024-02-29 NOTE — Plan of Care (Signed)

## 2024-02-29 NOTE — TOC Progression Note (Addendum)
 Transition of Care [facility]) - Progression Note    Patient Details  Name: Mark Hammond MRN: [id number] Date of Birth: May 13, 1991  [facility] CM/SW Contact  Hoang Pettingill A [place], LCSW Phone Number: 02/29/2024, 4:05 PM  Clinical Narrative:      CSW reached out to the following facilities for possible SUD residential treatment.   [facility]- Phone:[phone]. Fax:[phone]. CSW spoke with Joe in admissions. Said pt would not be able to have a cane or other DME equipment and need to be able to walk unassisted for at least one mile. Possible MAT treatment if opioid usage has been within 6 months. Needs application and clinicals emailed or faxed for review to be accepted. Wait list of 2-3 weeks  [facility]- Phone:  [phone]. Fax:[phone] Spoke with Jen in admissions. She said pt can have DME, walker or cane. Requested send clinical information a few more days post-surgery for their team to review, estimate mid-next week. Will need to complete pre-screen once clinicals have been sent. In-network with pt's insurance. 1 week wait list.     3. [facility]. Left vm with admissions, CSW to follow up again when able.    TOC will continue to follow.   Expected Discharge Plan and Services                                               Social Determinants of Health (SDOH) Interventions SDOH Screenings   Food Insecurity: Food Insecurity Present (02/20/2024)  Housing: High Risk (02/20/2024)  Transportation Needs: Unmet Transportation Needs (02/20/2024)  Utilities: At Risk (02/20/2024)  Alcohol Screen: High Risk (02/23/2023)  Depression (PHQ2-9): Low Risk  (01/17/2024)  Recent Concern: Depression (PHQ2-9) - High Risk (01/14/2024)  Social Connections: Unknown (02/20/2024)  Tobacco Use: High Risk (02/23/2024)    Readmission Risk Interventions    02/26/2024    4:21 PM 02/20/2024    3:21 PM 02/02/2024   11:06 AM  Readmission Risk Prevention Plan  Transportation Screening Complete Complete  Complete  Medication Review Ocean[facility]) Complete Complete Complete  PCP or Specialist appointment within 3-5 days of discharge Complete Complete Complete  HRI or Home Care Consult Complete Complete Complete  SW Recovery Care/Counseling Consult Not Complete Complete Complete  Palliative Care Screening Not Applicable Not Applicable Not Applicable  Skilled Nursing Facility Not Applicable Not Applicable Not Applicable

## 2024-02-29 NOTE — Progress Notes (Signed)
 Mobility Specialist: Progress Note   02/29/24 1206  Mobility  Activity Ambulated with assistance to bathroom  Level of Assistance Standby assist, set-up cues, supervision of patient - no hands on  Assistive Device Front wheel walker  Distance Ambulated (ft) 15 ft  LLE Weight Bearing Per Provider Order TWB  Activity Response Tolerated well  Mobility Referral Yes  Mobility visit 1 Mobility  Mobility Specialist Start Time (ACUTE ONLY) 1042  Mobility Specialist Stop Time (ACUTE ONLY) 1050  Mobility Specialist Time Calculation (min) (ACUTE ONLY) 8 min    Pt received in bed, declined mobility session but requesting assistance to BR. SV throughout. In a lot of pain this morning throughout ambulation and while trying to sit on elevated seat over the commode. Stated his L hip felt like it was "popping and snapping" when he shifted weight on it. Left on commode with all needs met, call bell in reach.   Deloria Fetch Mobility Specialist Please contact via SecureChat or Rehab office at [phone]

## 2024-02-29 NOTE — Progress Notes (Signed)
 Progress Note   Patient: Mark Hammond ZOX:[id number] DOB: 22-Aug-1991 DOA: 02/19/2024     9 DOS: the patient was seen and examined on 02/29/2024   Brief hospital course:  33 year old M polysubstance use/IVDU was recently discharged on 5/16 after treatment for septic left hip arthritis.  Patient was readmitted with cellulitis of the left foot ankle.  Patient underwent left hip arthroplasty for chronic septic arthritis and osteomyelitis with collapse of femoral head with failed IR drainage and outpatient antibiotic treatment.  He is presently getting treatment for foot cellulitis.   5/27: Hemodynamically stable, will be difficult to place for SNF with history of polysubstance abuse and on Medicaid.  Patient wants to go to rehab for his substance abuse.  Discussed with ID and will continue antibiotics for total of 6 weeks.   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  Followed closely by Ortho and infectious disease.  Plan for 6 weeks antibiotic course initiating 5/23.  Initial recommendation for 2-4 weeks of IV cefazolin  with transition to p.o. upon discharge.  Plan to reengage ID toward end of first week of June 2025    Normocytic anemia - Hemoglobin trending upward, 9.2 this morning.  S/p 1 unit PRBCs on 5/27.   Left foot cellulitis - Antibiotics as above.   Bipolar disorder - Seroquel  on board.   Acute hypoxic respiratory failure secondary to opioid overdose - Resolved.  Now on room air.   Subjective: Patient resting comfortably. Admitting to left hip pain which is to be expected. Denies any shortness of breath, chest pain, nausea, vomiting, abdominal pain. Noted fever 100.8 yesterday, but nothing since.   Physical Exam: Vitals:   02/28/24 1642 02/28/24 2017 02/29/24 0552 02/29/24 0909  BP: 112/73 107/76 104/68 107/66  Pulse: 83 87 97 92  Resp: 16  16 16 18   Temp: (!) 100.8 F (38.2 C) 99.3 F (37.4 C) 99.5 F (37.5 C) 98.4 F (36.9 C)  TempSrc: Oral Oral Oral   SpO2: 100% 100% 100% 99%  Weight:      Height:        GENERAL:  Alert, pleasant, no acute distress, thin HEENT:  EOMI CARDIOVASCULAR:  RRR, no murmurs appreciated RESPIRATORY:  Clear to auscultation, no wheezing, rales, or rhonchi GASTROINTESTINAL:  Soft, nontender, nondistended EXTREMITIES: Left hip wound VAC NEURO:  No new focal deficits appreciated SKIN:  No rashes noted PSYCH:  Appropriate mood and affect    Data Reviewed:  No new imaging to review  Previous records (including but not limited to H&P, progress notes, nursing notes, TOC management) were reviewed in assessment of this patient.  Labs: CBC: Recent Labs  Lab 02/25/24 1624 02/26/24 0531 02/27/24 0455 02/28/24 0055 02/29/24 0732  WBC 6.4 6.2 7.6 7.1 7.3  NEUTROABS  --   --   --  3.0  --   HGB 7.9* 8.2* 9.1* 9.2* 9.7*  HCT 24.2* 25.0* 28.0* 27.2* 29.4*  MCV 89.3 89.6 90.0 89.2 89.6  PLT 263 267 344 333 418*   Basic Metabolic Panel: Recent Labs  Lab 02/23/24 0728 02/24/24 0802 02/26/24 0531 02/27/24 0455 02/28/24 0611 02/29/24 0732  NA 137 137  --  136 135 132*  K 3.6 3.6  --  3.9 4.3 3.6  CL 102 101  --  99 100 99  CO2 28 26  --  27 27 24   GLUCOSE 99 184*  --  101* 102* 170*  BUN 7 9  --  8 10 11   CREATININE 0.51* 0.70  --  0.54* 0.53* 0.56*  CALCIUM 9.2 9.0  --  9.4 9.4 9.1  MG 1.8 1.6* 2.2  --   --  1.7  PHOS  --  3.8  --   --   --   --    Liver Function Tests: Recent Labs  Lab 02/23/24 0728 02/28/24 0611  AST 48* 51*  ALT 27 23  ALKPHOS 100 113  BILITOT 0.3 0.6  PROT 7.4 7.5  ALBUMIN  3.3* 3.6   CBG: No results for input(s): "GLUCAP" in the last 168 hours.  Scheduled Meds:  aspirin EC  81 mg Oral BID   docusate sodium   100 mg Oral BID   folic acid   1 mg Oral Daily   magnesium  oxide  400 mg Oral BID   multivitamin with minerals  1 tablet Oral Daily    nicotine   14 mg Transdermal Daily   nystatin    Topical TID   QUEtiapine   50 mg Oral QHS   thiamine   100 mg Oral Daily   Continuous Infusions:   ceFAZolin  (ANCEF ) IV 2 g (02/29/24 0522)   PRN Meds:.acetaminophen , diphenhydrAMINE , HYDROcodone -acetaminophen , HYDROcodone -acetaminophen , hydrOXYzine , magnesium  citrate, methocarbamol  **OR** methocarbamol  (ROBAXIN ) injection, metoCLOPramide **OR** metoCLOPramide (REGLAN) injection, morphine  injection, nicotine  polacrilex, ondansetron  **OR** ondansetron  (ZOFRAN ) IV, polyethylene glycol, sorbitol  Family Communication: None at bedside  Disposition: Status is: Inpatient Remains inpatient appropriate because: Osteomyelitis requiring IV antibiotics     Time spent: 34 minutes  Length of stay: 9 days  Author: Jodeane Mulligan, DO 02/29/2024 10:34 AM  For on call review [email or web address].

## 2024-03-01 DIAGNOSIS — L03116 Cellulitis of left lower limb: Secondary | ICD-10-CM | POA: Diagnosis not present

## 2024-03-01 DIAGNOSIS — M869 Osteomyelitis, unspecified: Secondary | ICD-10-CM | POA: Diagnosis not present

## 2024-03-01 DIAGNOSIS — D638 Anemia in other chronic diseases classified elsewhere: Secondary | ICD-10-CM | POA: Diagnosis not present

## 2024-03-01 LAB — CBC
HCT: 28.3 % — ABNORMAL LOW (ref 39.0–52.0)
Hemoglobin: 9.2 g/dL — ABNORMAL LOW (ref 13.0–17.0)
MCH: 29.2 pg (ref 26.0–34.0)
MCHC: 32.5 g/dL (ref 30.0–36.0)
MCV: 89.8 fL (ref 80.0–100.0)
Platelets: 362 10*3/uL (ref 150–400)
RBC: 3.15 MIL/uL — ABNORMAL LOW (ref 4.22–5.81)
RDW: 15.1 % (ref 11.5–15.5)
WBC: 5.9 10*3/uL (ref 4.0–10.5)
nRBC: 0 % (ref 0.0–0.2)

## 2024-03-01 LAB — BASIC METABOLIC PANEL WITH GFR
Anion gap: 10 (ref 5–15)
BUN: 11 mg/dL (ref 6–20)
CO2: 27 mmol/L (ref 22–32)
Calcium: 9.3 mg/dL (ref 8.9–10.3)
Chloride: 99 mmol/L (ref 98–111)
Creatinine, Ser: 0.57 mg/dL — ABNORMAL LOW (ref 0.61–1.24)
GFR, Estimated: >60 mL/min (ref >60)
Glucose, Bld: 149 mg/dL — ABNORMAL HIGH (ref 70–99)
Potassium: 3.3 mmol/L — ABNORMAL LOW (ref 3.5–5.1)
Sodium: 136 mmol/L (ref 135–145)

## 2024-03-01 MED ORDER — POTASSIUM CHLORIDE 20 MEQ PO PACK
40.0000 meq | PACK | Freq: Two times a day (BID) | ORAL | Status: AC
  Administered 2024-03-01 (×2): 40 meq via ORAL
  Filled 2024-03-01 (×2): qty 2

## 2024-03-01 NOTE — Plan of Care (Signed)

## 2024-03-01 NOTE — Progress Notes (Signed)
 Progress Note   Patient: Mark Hammond ZOX:[id number] DOB: [birth year] DOA: 02/19/2024     10 DOS: the patient was seen and examined on 03/01/2024   Brief hospital course:  33 year old M polysubstance use/IVDU was recently discharged on 5/16 after treatment for septic left hip arthritis.  Patient was readmitted with cellulitis of the left foot ankle.  Patient underwent left hip arthroplasty for chronic septic arthritis and osteomyelitis with collapse of femoral head with failed IR drainage and outpatient antibiotic treatment.  He is presently getting treatment for foot cellulitis.   5/27: Hemodynamically stable, will be difficult to place for SNF with history of polysubstance abuse and on Medicaid.  Patient wants to go to rehab for his substance abuse.  Discussed with ID and will continue antibiotics for total of 6 weeks.   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  Followed closely by Ortho and infectious disease.  Plan for 6 weeks antibiotic course initiating 5/23.  Initial recommendation for 2-4 weeks of IV cefazolin  with transition to p.o. upon discharge.  Plan to reengage ID toward end of first week of June 2025    Normocytic anemia - Hemoglobin stable s/p 1 unit PRBCs on 5/27.  No active bleeding appreciated.   Left foot cellulitis - Antibiotics as above.   Bipolar disorder - Seroquel  on board.   Acute hypoxic respiratory failure secondary to opioid overdose - Resolved.  Now on room air.   Subjective: Patient resting comfortably this morning.  Reports yesterday night he worked well with physical therapy.  Had some pain with his hip as to be expected.  Physical Exam: Vitals:   02/29/24 1417 02/29/24 1929 03/01/24 0507 03/01/24 0842  BP: 123/86 120/71 106/64 113/64  Pulse: 93 88 77 85  Resp: 19 16 18 16   Temp: 98.7 F (37.1 C) 99.4 F (37.4  C) 97.9 F (36.6 C) 98 F (36.7 C)  TempSrc:  Oral    SpO2: 99% 99% 99% 98%  Weight:      Height:        GENERAL:  Alert, pleasant, no acute distress, thin HEENT:  EOMI CARDIOVASCULAR:  RRR, no murmurs appreciated RESPIRATORY:  Clear to auscultation, no wheezing, rales, or rhonchi GASTROINTESTINAL:  Soft, nontender, nondistended EXTREMITIES: Left hip wound VAC NEURO:  No new focal deficits appreciated SKIN:  No rashes noted PSYCH:  Appropriate mood and affect    Data Reviewed:  No new imaging to review  Previous records (including but not limited to H&P, progress notes, nursing notes, TOC management) were reviewed in assessment of this patient.  Labs: CBC: Recent Labs  Lab 02/26/24 0531 02/27/24 0455 02/28/24 0055 02/29/24 0732 03/01/24 0813  WBC 6.2 7.6 7.1 7.3 5.9  NEUTROABS  --   --  3.0  --   --   HGB 8.2* 9.1* 9.2* 9.7* 9.2*  HCT 25.0* 28.0* 27.2* 29.4* 28.3*  MCV 89.6 90.0 89.2 89.6 89.8  PLT 267 344 333 418* 362   Basic Metabolic Panel: Recent Labs  Lab 02/24/24 0802 02/26/24 0531 02/27/24 0455 02/28/24 0611 02/29/24 0732 03/01/24 0813  NA 137  --  136 135 132* 136  K 3.6  --  3.9 4.3 3.6 3.3*  CL 101  --  99 100 99 99  CO2 26  --  27 27 24 27   GLUCOSE 184*  --  101* 102* 170* 149*  BUN  9  --  8 10 11 11   CREATININE 0.70  --  0.54* 0.53* 0.56* 0.57*  CALCIUM 9.0  --  9.4 9.4 9.1 9.3  MG 1.6* 2.2  --   --  1.7  --   PHOS 3.8  --   --   --   --   --    Liver Function Tests: Recent Labs  Lab 02/28/24 0611  AST 51*  ALT 23  ALKPHOS 113  BILITOT 0.6  PROT 7.5  ALBUMIN  3.6   CBG: No results for input(s): "GLUCAP" in the last 168 hours.  Scheduled Meds:  aspirin  EC  81 mg Oral BID   docusate sodium   100 mg Oral BID   folic acid   1 mg Oral Daily   magnesium  oxide  400 mg Oral BID   multivitamin with minerals  1 tablet Oral Daily   nicotine   14 mg Transdermal Daily   nystatin    Topical TID   potassium chloride   40 mEq Oral BID    QUEtiapine   50 mg Oral QHS   thiamine   100 mg Oral Daily   Continuous Infusions:   ceFAZolin  (ANCEF ) IV 2 g (03/01/24 0510)   PRN Meds:.acetaminophen , diphenhydrAMINE , HYDROcodone -acetaminophen , HYDROcodone -acetaminophen , hydrOXYzine , magnesium  citrate, methocarbamol  **OR** methocarbamol  (ROBAXIN ) injection, metoCLOPramide  **OR** metoCLOPramide  (REGLAN ) injection, morphine  injection, nicotine  polacrilex, ondansetron  **OR** ondansetron  (ZOFRAN ) IV, polyethylene glycol, sorbitol   Family Communication: None at bedside  Disposition: Status is: Inpatient Remains inpatient appropriate because: Osteomyelitis requiring IV antibiotics     Time spent: 32 minutes  Length of stay: 10 days  Author: Jodeane Mulligan, DO 03/01/2024 10:33 AM  For on call review [email or web address].

## 2024-03-01 NOTE — Progress Notes (Signed)
 Physical Therapy Treatment Patient Details Name: Mark Hammond MRN: [id number] DOB: 12/27/1990 Today's Date: 03/01/2024   History of Present Illness 33 y/o male adm 02/19/24 with ongoing LLE pain with MSSA, cellulitis, osteomyelitis and septic arthritis. 5/23 resection of left femoral head, placement of antibiotic hip spacer (posterior approach) with I&D adn VAC. PMhx: 4/30-5/16 for osteomyelitis and cellulitis of LLE w/ hip aspiration. ED visit 5/19 for OD. Alcoholic gastritis, BPD, Depression, drug abuse, ETOH abuse, hallucination, schizo affective schizophrenia.    PT Comments  Pt pleasant and very willing to mobilize. Pt utilizing UB to move LLE and position with pt unable to tolerate supine due to positioning of wound VAC but in sidelying does not maintain appropriate position for precautions with education for pillow/padding to decrease internal rotation of LLE at rest. Pt with improving gait tolerance able to walk 150' with RW. Pt with tight LLE IT band and educated for supine knee extension as well as HEP to promote quad strength and knee extension, pt limited by pain.     If plan is discharge home, recommend the following: Assist for transportation;Assistance with cooking/housework   Can travel by private Scientist, research (medical) walker (2 wheels);Wheelchair (measurements PT);Wheelchair cushion (measurements PT)    Recommendations for Other Services       Precautions / Restrictions Precautions Precautions: Fall;Posterior Hip Recall of Precautions/Restrictions: Impaired Precaution/Restrictions Comments: Pt able to verbalize 3/3 precautions but right sidelying on arrival with only folded pad between knees and educated for positioning Restrictions LLE Weight Bearing Per Provider Order: Touchdown weight bearing     Mobility  Bed Mobility Overal bed mobility: Needs Assistance Bed Mobility: Supine to Sit     Supine to sit: Supervision     General bed  mobility comments: supervision for safety, Pt uses RLE to lift LLE and pillow placed between knees to promote safe position    Transfers Overall transfer level: Needs assistance   Transfers: Sit to/from Stand Sit to Stand: Contact guard assist           General transfer comment: CGA for safety, no LOB, able to stand with one hand supported when repositioning RW to fit around [facility] at bathroom    Ambulation/Gait Ambulation/Gait assistance: Contact guard assist Gait Distance (Feet): 150 Feet Assistive device: Rolling walker (2 wheels) Gait Pattern/deviations: Step-to pattern   Gait velocity interpretation: 1.31 - 2.62 ft/sec, indicative of limited community ambulator   General Gait Details: pt with NWB LLE throughout and good use and control of RW. Pt able to progress gait distance with cues for direction   Stairs             Wheelchair Mobility     Tilt Bed    Modified Rankin (Stroke Patients Only)       Balance Overall balance assessment: Needs assistance Sitting-balance support: No upper extremity supported, Feet supported Sitting balance-Leahy Scale: Good Sitting balance - Comments: prefers UE support due to L hip pain   Standing balance support: Single extremity supported, During functional activity, Bilateral upper extremity supported Standing balance-Leahy Scale: Poor Standing balance comment: UE support due to NWB on LLE                            Communication Communication Communication: No apparent difficulties  Cognition Arousal: Alert Behavior During Therapy: WFL for tasks assessed/performed   PT - Cognitive impairments: Safety/Judgement, Problem solving, Memory  PT - Cognition Comments: pt able to recall precautions but remains with limited ability to apply them during mobility needing cues for safety and positioning. pt with significant tendency for internal rotation Following commands:  Intact Following commands impaired: Only follows one step commands consistently    Cueing Cueing Techniques: Verbal cues  Exercises General Exercises - Lower Extremity Short Arc Quad: AAROM, Left, 10 reps, Seated    General Comments        Pertinent Vitals/Pain Pain Assessment Pain Assessment: 0-10 Pain Score: 7  Pain Location: left hip after mobility and knee extension Pain Descriptors / Indicators: Guarding, Grimacing, Sore, Throbbing Pain Intervention(s): Monitored during session, Premedicated before session, Repositioned, Patient requesting pain meds-RN notified, Limited activity within patient's tolerance    Home Living                          Prior Function            PT Goals (current goals can now be found in the care plan section) Progress towards PT goals: Progressing toward goals    Frequency    Min 2X/week      PT Plan      Co-evaluation              AM-PAC PT "6 Clicks" Mobility   Outcome Measure  Help needed turning from your back to your side while in a flat bed without using bedrails?: None Help needed moving from lying on your back to sitting on the side of a flat bed without using bedrails?: A Little Help needed moving to and from a bed to a chair (including a wheelchair)?: A Little Help needed standing up from a chair using your arms (e.g., wheelchair or bedside chair)?: A Little Help needed to walk in hospital room?: A Little Help needed climbing 3-5 steps with a railing? : A Lot 6 Click Score: 18    End of Session   Activity Tolerance: Patient tolerated treatment well Patient left: in chair;with call bell/phone within reach Nurse Communication: Mobility status;Patient requests pain meds PT Visit Diagnosis: Other abnormalities of gait and mobility (R26.89);Pain     Time: 6237-6283 PT Time Calculation (min) (ACUTE ONLY): 27 min  Charges:    $Gait Training: 8-22 mins $Therapeutic Activity: 8-22 mins PT General  Charges $$ ACUTE PT VISIT: 1 Visit                     Annis Baseman, PT Acute Rehabilitation Services Office: [phone]    Jackey Mary Carah Barrientes 03/01/2024, 1:07 PM

## 2024-03-02 DIAGNOSIS — L03116 Cellulitis of left lower limb: Secondary | ICD-10-CM | POA: Diagnosis not present

## 2024-03-02 DIAGNOSIS — D638 Anemia in other chronic diseases classified elsewhere: Secondary | ICD-10-CM | POA: Diagnosis not present

## 2024-03-02 DIAGNOSIS — M869 Osteomyelitis, unspecified: Secondary | ICD-10-CM | POA: Diagnosis not present

## 2024-03-02 LAB — COMPREHENSIVE METABOLIC PANEL WITH GFR
ALT: 22 U/L (ref 0–44)
AST: 51 U/L — ABNORMAL HIGH (ref 15–41)
Albumin: 3.7 g/dL (ref 3.5–5.0)
Alkaline Phosphatase: 124 U/L (ref 38–126)
Anion gap: 9 (ref 5–15)
BUN: 8 mg/dL (ref 6–20)
CO2: 26 mmol/L (ref 22–32)
Calcium: 9 mg/dL (ref 8.9–10.3)
Chloride: 100 mmol/L (ref 98–111)
Creatinine, Ser: 0.62 mg/dL (ref 0.61–1.24)
GFR, Estimated: >60 mL/min (ref >60)
Glucose, Bld: 108 mg/dL — ABNORMAL HIGH (ref 70–99)
Potassium: 3.6 mmol/L (ref 3.5–5.1)
Sodium: 135 mmol/L (ref 135–145)
Total Bilirubin: 0.6 mg/dL (ref 0.0–1.2)
Total Protein: 7.8 g/dL (ref 6.5–8.1)

## 2024-03-02 LAB — CBC
HCT: 30.1 % — ABNORMAL LOW (ref 39.0–52.0)
Hemoglobin: 9.8 g/dL — ABNORMAL LOW (ref 13.0–17.0)
MCH: 29.8 pg (ref 26.0–34.0)
MCHC: 32.6 g/dL (ref 30.0–36.0)
MCV: 91.5 fL (ref 80.0–100.0)
Platelets: 391 10*3/uL (ref 150–400)
RBC: 3.29 MIL/uL — ABNORMAL LOW (ref 4.22–5.81)
RDW: 14.9 % (ref 11.5–15.5)
WBC: 8.1 10*3/uL (ref 4.0–10.5)
nRBC: 0 % (ref 0.0–0.2)

## 2024-03-02 LAB — MAGNESIUM: Magnesium: 1.7 mg/dL (ref 1.7–2.4)

## 2024-03-02 NOTE — Plan of Care (Signed)

## 2024-03-02 NOTE — Progress Notes (Signed)
 IVT consult for difficult piv Stick:  At bedside; introduced self; pt laying on R side. Previous PIV was on left arm. Assessed RLFA, no suitable veins;veins small and deep. Pt refused piv to upper arm. Pt stated " If I get uncomfortable with the IV , I will just rip it out"; I educated pt on importance of piv access, for ABT and informed him I can look at his L arm ,As IV nurse was about to assess,pt stated he cannot lay on his back and stated: " I wish you motherfuckers, dont work tomorrow," Just leave it, I will wait for day shift. I stated to pt, 'I'm sorry he feels this way and that we nuirses are here to help him get better." I left the room and notified unit RN of pt refusal and his statements. Will inform Day shift IV nurse.

## 2024-03-02 NOTE — Progress Notes (Signed)
 OT Cancellation Note  Patient Details Name: Mark Hammond MRN: [id number] DOB: 17-Nov-1990   Cancelled Treatment:    Reason Eval/Treat Not Completed: Pain limiting ability to participate;Other (comment) 2 attempts to see pt. Today for skilled OT treatment session.  1st attempt pt. Receiving pain meds so rn requested to try back later.  2nd attempt pt. Declines eob oob participation including review of A/E stating he knows how to use it.  States he will try tomorrow.  Will check back as schedule allows.    Leory Rands [place]-COTA/L  03/02/2024, 1:17 PM

## 2024-03-02 NOTE — Plan of Care (Signed)
  Problem: Education: Goal: Knowledge of General Education information will improve Description: Including pain rating scale, medication(s)/side effects and non-pharmacologic comfort measures 03/02/2024 1901 by Mauro Sox, LPN Outcome: Progressing 03/02/2024 1900 by Mauro Sox, LPN Outcome: Progressing   Problem: Health Behavior/Discharge Planning: Goal: Ability to manage health-related needs will improve 03/02/2024 1901 by Mauro Sox, LPN Outcome: Progressing 03/02/2024 1900 by Mauro Sox, LPN Outcome: Progressing   Problem: Clinical Measurements: Goal: Ability to maintain clinical measurements within normal limits will improve 03/02/2024 1901 by Mauro Sox, LPN Outcome: Progressing 03/02/2024 1900 by Mauro Sox, LPN Outcome: Progressing Goal: Will remain free from infection 03/02/2024 1901 by Mauro Sox, LPN Outcome: Progressing 03/02/2024 1900 by Mauro Sox, LPN Outcome: Progressing Goal: Diagnostic test results will improve 03/02/2024 1901 by Mauro Sox, LPN Outcome: Progressing 03/02/2024 1900 by Mauro Sox, LPN Outcome: Progressing Goal: Respiratory complications will improve 03/02/2024 1901 by Mauro Sox, LPN Outcome: Progressing 03/02/2024 1900 by Mauro Sox, LPN Outcome: Progressing Goal: Cardiovascular complication will be avoided 03/02/2024 1901 by Mauro Sox, LPN Outcome: Progressing 03/02/2024 1900 by Mauro Sox, LPN Outcome: Progressing   Problem: Activity: Goal: Risk for activity intolerance will decrease 03/02/2024 1901 by Mauro Sox, LPN Outcome: Progressing 03/02/2024 1900 by Mauro Sox, LPN Outcome: Progressing   Problem: Nutrition: Goal: Adequate nutrition will be maintained 03/02/2024 1901 by Mauro Sox, LPN Outcome: Progressing 03/02/2024 1900 by Mauro Sox, LPN Outcome: Progressing   Problem: Coping: Goal: Level of anxiety will decrease 03/02/2024 1901 by Mauro Sox, LPN Outcome:  Progressing 03/02/2024 1900 by Mauro Sox, LPN Outcome: Progressing   Problem: Elimination: Goal: Will not experience complications related to bowel motility 03/02/2024 1901 by Mauro Sox, LPN Outcome: Progressing 03/02/2024 1900 by Mauro Sox, LPN Outcome: Progressing Goal: Will not experience complications related to urinary retention 03/02/2024 1901 by Mauro Sox, LPN Outcome: Progressing 03/02/2024 1900 by Mauro Sox, LPN Outcome: Progressing   Problem: Pain Managment: Goal: General experience of comfort will improve and/or be controlled 03/02/2024 1901 by Mauro Sox, LPN Outcome: Progressing 03/02/2024 1900 by Mauro Sox, LPN Outcome: Progressing   Problem: Safety: Goal: Ability to remain free from injury will improve 03/02/2024 1901 by Mauro Sox, LPN Outcome: Progressing 03/02/2024 1900 by Mauro Sox, LPN Outcome: Progressing   Problem: Skin Integrity: Goal: Risk for impaired skin integrity will decrease 03/02/2024 1901 by Mauro Sox, LPN Outcome: Progressing 03/02/2024 1900 by Mauro Sox, LPN Outcome: Progressing

## 2024-03-02 NOTE — Plan of Care (Signed)
  Problem: Education: Goal: Knowledge of General Education information will improve Description: Including pain rating scale, medication(s)/side effects and non-pharmacologic comfort measures 03/02/2024 1901 by Mauro Sox, LPN Outcome: Progressing 03/02/2024 1901 by Mauro Sox, LPN Outcome: Progressing 03/02/2024 1900 by Mauro Sox, LPN Outcome: Progressing   Problem: Health Behavior/Discharge Planning: Goal: Ability to manage health-related needs will improve 03/02/2024 1901 by Mauro Sox, LPN Outcome: Progressing 03/02/2024 1901 by Mauro Sox, LPN Outcome: Progressing 03/02/2024 1900 by Mauro Sox, LPN Outcome: Progressing   Problem: Clinical Measurements: Goal: Ability to maintain clinical measurements within normal limits will improve 03/02/2024 1901 by Mauro Sox, LPN Outcome: Progressing 03/02/2024 1901 by Mauro Sox, LPN Outcome: Progressing 03/02/2024 1900 by Mauro Sox, LPN Outcome: Progressing Goal: Will remain free from infection 03/02/2024 1901 by Mauro Sox, LPN Outcome: Progressing 03/02/2024 1901 by Mauro Sox, LPN Outcome: Progressing 03/02/2024 1900 by Mauro Sox, LPN Outcome: Progressing Goal: Diagnostic test results will improve 03/02/2024 1901 by Mauro Sox, LPN Outcome: Progressing 03/02/2024 1901 by Mauro Sox, LPN Outcome: Progressing 03/02/2024 1900 by Mauro Sox, LPN Outcome: Progressing Goal: Respiratory complications will improve 03/02/2024 1901 by Mauro Sox, LPN Outcome: Progressing 03/02/2024 1901 by Mauro Sox, LPN Outcome: Progressing 03/02/2024 1900 by Mauro Sox, LPN Outcome: Progressing Goal: Cardiovascular complication will be avoided 03/02/2024 1901 by Mauro Sox, LPN Outcome: Progressing 03/02/2024 1901 by Mauro Sox, LPN Outcome: Progressing 03/02/2024 1900 by Mauro Sox, LPN Outcome: Progressing   Problem: Activity: Goal: Risk for activity intolerance will  decrease 03/02/2024 1901 by Mauro Sox, LPN Outcome: Progressing 03/02/2024 1901 by Mauro Sox, LPN Outcome: Progressing 03/02/2024 1900 by Mauro Sox, LPN Outcome: Progressing   Problem: Nutrition: Goal: Adequate nutrition will be maintained 03/02/2024 1901 by Mauro Sox, LPN Outcome: Progressing 03/02/2024 1901 by Mauro Sox, LPN Outcome: Progressing 03/02/2024 1900 by Mauro Sox, LPN Outcome: Progressing   Problem: Coping: Goal: Level of anxiety will decrease 03/02/2024 1901 by Mauro Sox, LPN Outcome: Progressing 03/02/2024 1901 by Mauro Sox, LPN Outcome: Progressing 03/02/2024 1900 by Mauro Sox, LPN Outcome: Progressing   Problem: Elimination: Goal: Will not experience complications related to bowel motility 03/02/2024 1901 by Mauro Sox, LPN Outcome: Progressing 03/02/2024 1901 by Mauro Sox, LPN Outcome: Progressing 03/02/2024 1900 by Mauro Sox, LPN Outcome: Progressing Goal: Will not experience complications related to urinary retention 03/02/2024 1901 by Mauro Sox, LPN Outcome: Progressing 03/02/2024 1901 by Mauro Sox, LPN Outcome: Progressing 03/02/2024 1900 by Mauro Sox, LPN Outcome: Progressing   Problem: Pain Managment: Goal: General experience of comfort will improve and/or be controlled 03/02/2024 1901 by Mauro Sox, LPN Outcome: Progressing 03/02/2024 1901 by Mauro Sox, LPN Outcome: Progressing 03/02/2024 1900 by Mauro Sox, LPN Outcome: Progressing   Problem: Safety: Goal: Ability to remain free from injury will improve 03/02/2024 1901 by Mauro Sox, LPN Outcome: Progressing 03/02/2024 1901 by Mauro Sox, LPN Outcome: Progressing 03/02/2024 1900 by Mauro Sox, LPN Outcome: Progressing   Problem: Skin Integrity: Goal: Risk for impaired skin integrity will decrease 03/02/2024 1901 by Mauro Sox, LPN Outcome: Progressing 03/02/2024 1901 by Mauro Sox, LPN Outcome:  Progressing 03/02/2024 1900 by Mauro Sox, LPN Outcome: Progressing

## 2024-03-02 NOTE — Plan of Care (Incomplete)

## 2024-03-02 NOTE — Progress Notes (Signed)
 Progress Note   Patient: Mark Hammond WUJ:[id number] DOB: January 01, 1991 DOA: 02/19/2024  DOS: the patient was seen and examined on 03/02/2024   Brief hospital course:  33 year old M polysubstance use/IVDU was recently discharged on 5/16 after treatment for septic left hip arthritis.  Patient was readmitted with cellulitis of the left foot ankle.  Patient underwent left hip arthroplasty for chronic septic arthritis and osteomyelitis with collapse of femoral head with failed IR drainage and outpatient antibiotic treatment.  He is presently getting treatment for foot cellulitis.   5/27: Hemodynamically stable, will be difficult to place for SNF with history of polysubstance abuse and on Medicaid.  Patient wants to go to rehab for his substance abuse.  Discussed with ID and will continue antibiotics for total of 6 weeks.   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  Followed closely by Ortho and infectious disease.  Plan for 6 weeks antibiotic course initiating 5/23.  Initial recommendation for 2-4 weeks of IV cefazolin  with transition to p.o. upon discharge.  Plan to reengage ID toward end of first week of June 2025    Normocytic anemia - Hemoglobin stable s/p 1 unit PRBCs on 5/27.  No active bleeding appreciated.   Left foot cellulitis - Antibiotics as above.   Bipolar disorder - Seroquel  on board.   Acute hypoxic respiratory failure secondary to opioid overdose - Resolved.  Now on room air.   Subjective: Patient resting comfortably this morning.  Apparently had lost IV access overnight.  With attempting to gain peripheral IV access this morning.  Otherwise no complaints.  Denies any fever, chills, chest pain, nausea, vomiting, abdominal pain.  Wound VAC in place.  Pain in the left hip as to be expected.  Physical Exam:  Vitals:   03/01/24 0842 03/01/24  1931 03/02/24 0407 03/02/24 0833  BP: 113/64 111/73 135/80 113/66  Pulse: 85 80 (!) 107 (!) 102  Resp: 16 18 20 17   Temp: 98 F (36.7 C) 98.4 F (36.9 C) 98.2 F (36.8 C) 98.5 F (36.9 C)  TempSrc:  Oral Oral   SpO2: 98% 100% 100% 99%  Weight:      Height:        GENERAL:  Alert, pleasant, no acute distress, thin HEENT:  EOMI CARDIOVASCULAR:  RRR, no murmurs appreciated RESPIRATORY:  Clear to auscultation, no wheezing, rales, or rhonchi GASTROINTESTINAL:  Soft, nontender, nondistended EXTREMITIES: Left hip wound VAC NEURO:  No new focal deficits appreciated SKIN:  No rashes noted PSYCH:  Appropriate mood and affect    Data Reviewed:  There are no new results to review at this time.  Previous records (including but not limited to H&P, progress notes, nursing notes, TOC management) were reviewed in assessment of this patient.  Labs: CBC: Recent Labs  Lab 02/27/24 0455 02/28/24 0055 02/29/24 0732 03/01/24 0813 03/02/24 0424  WBC 7.6 7.1 7.3 5.9 8.1  NEUTROABS  --  3.0  --   --   --   HGB 9.1* 9.2* 9.7* 9.2* 9.8*  HCT 28.0* 27.2* 29.4* 28.3* 30.1*  MCV 90.0 89.2 89.6 89.8 91.5  PLT 344 333 418* 362 391   Basic Metabolic Panel: Recent Labs  Lab 02/26/24 0531 02/27/24 0455 02/28/24 0611 02/29/24 0732 03/01/24 0813 03/02/24 0424  NA  --  136 135 132* 136 135  K  --  3.9 4.3 3.6 3.3* 3.6  CL  --  99 100 99 99 100  CO2  --  27 27 24 27 26   GLUCOSE  --  101* 102* 170* 149* 108*  BUN  --  8 10 11 11 8   CREATININE  --  0.54* 0.53* 0.56* 0.57* 0.62  CALCIUM  --  9.4 9.4 9.1 9.3 9.0  MG 2.2  --   --  1.7  --  1.7   Liver Function Tests: Recent Labs  Lab 02/28/24 0611 03/02/24 0424  AST 51* 51*  ALT 23 22  ALKPHOS 113 124  BILITOT 0.6 0.6  PROT 7.5 7.8  ALBUMIN  3.6 3.7   CBG: No results for input(s): "GLUCAP" in the last 168 hours.  Scheduled Meds:  aspirin  EC  81 mg Oral BID   docusate sodium   100 mg Oral BID   folic acid   1 mg Oral Daily    magnesium  oxide  400 mg Oral BID   multivitamin with minerals  1 tablet Oral Daily   nicotine   14 mg Transdermal Daily   nystatin    Topical TID   QUEtiapine   50 mg Oral QHS   thiamine   100 mg Oral Daily   Continuous Infusions:   ceFAZolin  (ANCEF ) IV 2 g (03/02/24 0856)   PRN Meds:.acetaminophen , diphenhydrAMINE , HYDROcodone -acetaminophen , HYDROcodone -acetaminophen , hydrOXYzine , magnesium  citrate, methocarbamol  **OR** methocarbamol  (ROBAXIN ) injection, metoCLOPramide  **OR** metoCLOPramide  (REGLAN ) injection, morphine  injection, nicotine  polacrilex, ondansetron  **OR** ondansetron  (ZOFRAN ) IV, polyethylene glycol, sorbitol   Family Communication: None at bedside  Disposition: Status is: Inpatient Remains inpatient appropriate because: Requiring IV antibiotics     Time spent: 31 minutes  Length of inpatient stay: 11 days  Author: Jodeane Mulligan, DO 03/02/2024 12:51 PM  For on call review [email or web address].

## 2024-03-03 DIAGNOSIS — M869 Osteomyelitis, unspecified: Secondary | ICD-10-CM | POA: Diagnosis not present

## 2024-03-03 NOTE — Progress Notes (Signed)
 Progress Note   Patient: Mark Hammond RUE:[id number] DOB: 10/02/1991 DOA: 02/19/2024  DOS: the patient was seen and examined on 03/03/2024   Brief hospital course:  33 year old M polysubstance use/IVDU was recently discharged on 5/16 after treatment for septic left hip arthritis.  Patient was readmitted with cellulitis of the left foot ankle.  Patient underwent left hip arthroplasty for chronic septic arthritis and osteomyelitis with collapse of femoral head with failed IR drainage and outpatient antibiotic treatment.  He is presently getting treatment for foot cellulitis.   5/27: Hemodynamically stable, will be difficult to place for SNF with history of polysubstance abuse and on Medicaid.  Patient wants to go to rehab for his substance abuse.  Discussed with ID and will continue antibiotics for total of 6 weeks.   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  Followed closely by Ortho and infectious disease.  Plan for 6 weeks antibiotic course initiating 5/23.  Initial recommendation for 2-4 weeks of IV cefazolin  with transition to p.o. upon discharge.  Plan to reengage ID toward end of first week of June 2025    Normocytic anemia - Hemoglobin stable s/p 1 unit PRBCs on 5/27.  No active bleeding appreciated.   Left foot cellulitis - Antibiotics as above.   Bipolar disorder - Seroquel  on board.   Acute hypoxic respiratory failure secondary to opioid overdose - Resolved.  Now on room air.   Subjective:  Patient resting comfortably this morning.  Otherwise no complaints.  Denies any fever, chills, chest pain, nausea, vomiting, abdominal pain.  Wound VAC in place.    Physical Exam:  Vitals:   03/02/24 1640 03/02/24 2058 03/03/24 0548 03/03/24 0815  BP: (!) 114/57 108/62 104/63 122/72  Pulse: 93 86 75 94  Resp: 18 18 18 19   Temp: 98.3 F (36.8 C)  99.1 F (37.3 C) 98.2 F (36.8 C) 98.1 F (36.7 C)  TempSrc:  Oral Oral Oral  SpO2: 100% 100% 100% 99%  Weight:      Height:        GENERAL:  Alert, pleasant, no acute distress, thin HEENT:  EOMI CARDIOVASCULAR:  RRR, no murmurs appreciated RESPIRATORY:  Clear to auscultation, no wheezing, rales, or rhonchi GASTROINTESTINAL:  Soft, nontender, nondistended EXTREMITIES: Left hip wound VAC NEURO:  No new focal deficits appreciated SKIN:  No rashes noted PSYCH:  Appropriate mood and affect    Data Reviewed:  There are no new results to review at this time.  Previous records (including but not limited to H&P, progress notes, nursing notes, TOC management) were reviewed in assessment of this patient.  Labs: CBC: Recent Labs  Lab 02/27/24 0455 02/28/24 0055 02/29/24 0732 03/01/24 0813 03/02/24 0424  WBC 7.6 7.1 7.3 5.9 8.1  NEUTROABS  --  3.0  --   --   --   HGB 9.1* 9.2* 9.7* 9.2* 9.8*  HCT 28.0* 27.2* 29.4* 28.3* 30.1*  MCV 90.0 89.2 89.6 89.8 91.5  PLT 344 333 418* 362 391   Basic Metabolic Panel: Recent Labs  Lab 02/26/24 0531 02/27/24 0455 02/28/24 0611 02/29/24 0732 03/01/24 0813 03/02/24 0424  NA  --  136 135 132* 136 135  K  --  3.9 4.3 3.6 3.3* 3.6  CL  --  99 100 99 99 100  CO2  --  27 27 24 27 26   GLUCOSE  --  101* 102*  170* 149* 108*  BUN  --  8 10 11 11 8   CREATININE  --  0.54* 0.53* 0.56* 0.57* 0.62  CALCIUM  --  9.4 9.4 9.1 9.3 9.0  MG 2.2  --   --  1.7  --  1.7   Liver Function Tests: Recent Labs  Lab 02/28/24 0611 03/02/24 0424  AST 51* 51*  ALT 23 22  ALKPHOS 113 124  BILITOT 0.6 0.6  PROT 7.5 7.8  ALBUMIN  3.6 3.7   CBG: No results for input(s): "GLUCAP" in the last 168 hours.  Scheduled Meds:  aspirin  EC  81 mg Oral BID   docusate sodium   100 mg Oral BID   folic acid   1 mg Oral Daily   magnesium  oxide  400 mg Oral BID   multivitamin with minerals  1 tablet Oral Daily   nicotine   14 mg Transdermal Daily   nystatin     Topical TID   QUEtiapine   50 mg Oral QHS   thiamine   100 mg Oral Daily   Continuous Infusions:   ceFAZolin  (ANCEF ) IV 2 g (03/03/24 0544)   PRN Meds:.acetaminophen , diphenhydrAMINE , HYDROcodone -acetaminophen , HYDROcodone -acetaminophen , hydrOXYzine , magnesium  citrate, methocarbamol  **OR** methocarbamol  (ROBAXIN ) injection, metoCLOPramide  **OR** metoCLOPramide  (REGLAN ) injection, morphine  injection, nicotine  polacrilex, ondansetron  **OR** ondansetron  (ZOFRAN ) IV, polyethylene glycol, sorbitol   Family Communication: none  Disposition: Status is: Inpatient Remains inpatient appropriate because: osteomyelitis     Time spent: 25 minutes  Length of inpatient stay: 12 days  Author: Jodeane Mulligan, DO 03/03/2024 1:37 PM  For on call review [email or web address].

## 2024-03-03 NOTE — Plan of Care (Signed)

## 2024-03-04 DIAGNOSIS — M869 Osteomyelitis, unspecified: Secondary | ICD-10-CM | POA: Diagnosis not present

## 2024-03-04 DIAGNOSIS — D638 Anemia in other chronic diseases classified elsewhere: Secondary | ICD-10-CM | POA: Diagnosis not present

## 2024-03-04 NOTE — Progress Notes (Signed)
 Mobility Specialist: Progress Note   03/04/24 1239  Mobility  Activity Ambulated with assistance in hallway  Level of Assistance Standby assist, set-up cues, supervision of patient - no hands on  Assistive Device Front wheel walker  Distance Ambulated (ft) 150 ft  LLE Weight Bearing Per Provider Order TWB  Activity Response Tolerated well  Mobility Referral Yes  Mobility visit 1 Mobility  Mobility Specialist Start Time (ACUTE ONLY) 1055  Mobility Specialist Stop Time (ACUTE ONLY) 1112  Mobility Specialist Time Calculation (min) (ACUTE ONLY) 17 min    Pt received in bed, agreeable to mobility session. SV throughout. L hip in still very painful with mobility, pt still unable to fully lean on hip upon sitting EOB or WB through LLE during ambulation. Ambulated around the loop and returned to room without fault. Left in bed with pillow between his knees with all needs met, call bell in reach.   Deloria Fetch Mobility Specialist Please contact via SecureChat or Rehab office at [phone]

## 2024-03-04 NOTE — Plan of Care (Signed)
   Problem: Education: Goal: Knowledge of General Education information will improve Description Including pain rating scale, medication(s)/side effects and non-pharmacologic comfort measures Outcome: Progressing

## 2024-03-04 NOTE — Progress Notes (Signed)
 Progress Note   Patient: Mark Hammond VWU:[id number] DOB: Jul 04, 1991 DOA: 02/19/2024  DOS: the patient was seen and examined on 03/04/2024   Brief hospital course:  33 year old M polysubstance use/IVDU was recently discharged on 5/16 after treatment for septic left hip arthritis.  Patient was readmitted with cellulitis of the left foot ankle.  Patient underwent left hip arthroplasty for chronic septic arthritis and osteomyelitis with collapse of femoral head with failed IR drainage and outpatient antibiotic treatment.  He is presently getting treatment for foot cellulitis.   5/27: Hemodynamically stable, will be difficult to place for SNF with history of polysubstance abuse and on Medicaid.  Patient wants to go to rehab for his substance abuse.  Discussed with ID and will continue antibiotics for total of 6 weeks.   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  Followed closely by Ortho and infectious disease.  Plan for 6 weeks antibiotic course initiating 5/23.  Initial recommendation for 2-4 weeks of IV cefazolin  with transition to p.o. upon discharge.  Plan to reengage ID toward end of first week of June 2025    Normocytic anemia - Hemoglobin stable s/p 1 unit PRBCs on 5/27.  No active bleeding appreciated.   Left foot cellulitis - Antibiotics as above.   Bipolar disorder - Seroquel  on board.   Acute hypoxic respiratory failure secondary to opioid overdose - Resolved.  Now on room air.   Subjective: Patient lying comfortably on his side.  No acute events overnight.  Denies any fever, chills, shortness of breath, chest pain, nausea, vomiting, abdominal pain.  Physical Exam:  Vitals:   03/03/24 0815 03/03/24 1649 03/03/24 2123 03/04/24 0837  BP: 122/72 107/60 108/69 (!) 105/59  Pulse: 94 79 86 86  Resp: 19 19 18 18   Temp: 98.1 F (36.7 C) 98.5 F  (36.9 C) 98.6 F (37 C) 98.3 F (36.8 C)  TempSrc: Oral Oral Oral Oral  SpO2: 99% 100% 100% 99%  Weight:      Height:        GENERAL:  Alert, pleasant, no acute distress, thin HEENT:  EOMI CARDIOVASCULAR:  RRR, no murmurs appreciated RESPIRATORY:  Clear to auscultation, no wheezing, rales, or rhonchi GASTROINTESTINAL:  Soft, nontender, nondistended EXTREMITIES: Left hip wound VAC NEURO:  No new focal deficits appreciated SKIN:  No rashes noted PSYCH:  Appropriate mood and affect    Data Reviewed:  There are no new results to review at this time.  Previous records (including but not limited to H&P, progress notes, nursing notes, TOC management) were reviewed in assessment of this patient.  Labs: CBC: Recent Labs  Lab 02/27/24 0455 02/28/24 0055 02/29/24 0732 03/01/24 0813 03/02/24 0424  WBC 7.6 7.1 7.3 5.9 8.1  NEUTROABS  --  3.0  --   --   --   HGB 9.1* 9.2* 9.7* 9.2* 9.8*  HCT 28.0* 27.2* 29.4* 28.3* 30.1*  MCV 90.0 89.2 89.6 89.8 91.5  PLT 344 333 418* 362 391   Basic Metabolic Panel: Recent Labs  Lab 02/27/24 0455 02/28/24 0611 02/29/24 0732 03/01/24 0813 03/02/24 0424  NA 136 135 132* 136 135  K 3.9 4.3 3.6 3.3* 3.6  CL 99 100 99 99 100  CO2 27 27 24 27 26   GLUCOSE 101* 102* 170* 149* 108*  BUN 8 10 11 11 8   CREATININE 0.54* 0.53* 0.56* 0.57* 0.62  CALCIUM 9.4  9.4 9.1 9.3 9.0  MG  --   --  1.7  --  1.7   Liver Function Tests: Recent Labs  Lab 02/28/24 0611 03/02/24 0424  AST 51* 51*  ALT 23 22  ALKPHOS 113 124  BILITOT 0.6 0.6  PROT 7.5 7.8  ALBUMIN  3.6 3.7   CBG: No results for input(s): "GLUCAP" in the last 168 hours.  Scheduled Meds:  aspirin  EC  81 mg Oral BID   docusate sodium   100 mg Oral BID   folic acid   1 mg Oral Daily   magnesium  oxide  400 mg Oral BID   multivitamin with minerals  1 tablet Oral Daily   nicotine   14 mg Transdermal Daily   nystatin    Topical TID   QUEtiapine   50 mg Oral QHS   thiamine   100 mg Oral Daily    Continuous Infusions:   ceFAZolin  (ANCEF ) IV 2 g (03/04/24 0603)   PRN Meds:.acetaminophen , diphenhydrAMINE , HYDROcodone -acetaminophen , HYDROcodone -acetaminophen , hydrOXYzine , magnesium  citrate, methocarbamol  **OR** methocarbamol  (ROBAXIN ) injection, metoCLOPramide  **OR** metoCLOPramide  (REGLAN ) injection, morphine  injection, nicotine  polacrilex, ondansetron  **OR** ondansetron  (ZOFRAN ) IV, polyethylene glycol, sorbitol   Family Communication: None at bedside  Disposition: Status is: Inpatient Remains inpatient appropriate because: Left hip osteo with IV antibiotics     Time spent: 23 minutes  Length of inpatient stay: 13 days  Author: Jodeane Mulligan, DO 03/04/2024 12:46 PM  For on call review [email or web address].

## 2024-03-04 NOTE — Progress Notes (Signed)
 Occupational Therapy Treatment Patient Details Name: Mark Hammond MRN: [id number] DOB: [birth year] Today's Date: 03/04/2024   History of present illness 33 y/o male adm 02/19/24 with ongoing LLE pain with MSSA, cellulitis, osteomyelitis and septic arthritis. 5/23 resection of left femoral head, placement of antibiotic hip spacer (posterior approach) with I&D adn VAC. PMhx: 4/30-5/16 for osteomyelitis and cellulitis of LLE w/ hip aspiration. ED visit 5/19 for OD. Alcoholic gastritis, BPD, Depression, drug abuse, ETOH abuse, hallucination, schizo affective schizophrenia.   OT comments  Pt progressing towards goals this session, states posterior hip precautions without cues and adheres to WB precautions well. Pt overall supervision for bed mobility and transfers. Adjusted pt's 3in1 higher for easier transfer as pt needs incr time to lower self to [facility] and heavily leans to R to offload L hip in sitting. Pt presenting with impairments listed below, will follow acutely. Continue to anticipate no OT follow up needs at d/c.       If plan is discharge home, recommend the following:  Assistance with cooking/housework;Direct supervision/assist for medications management;Assist for transportation;Help with stairs or ramp for entrance;A little help with bathing/dressing/bathroom   Equipment Recommendations  BSC/3in1;Other (comment) (RW)    Recommendations for Other Services PT consult    Precautions / Restrictions Precautions Precautions: Fall;Posterior Hip Precaution/Restrictions Comments: verbalizes 3/3 precautions Restrictions Weight Bearing Restrictions Per Provider Order: Yes LLE Weight Bearing Per Provider Order: Touchdown weight bearing       Mobility Bed Mobility Overal bed mobility: Needs Assistance Bed Mobility: Supine to Sit   Sidelying to sit: Supervision       General bed mobility comments: cues to keep pillow between legs during bed mobility to prevent IR/crossing of legs     Transfers Overall transfer level: Needs assistance Equipment used: Rolling walker (2 wheels) Transfers: Sit to/from Stand Sit to Stand: Supervision                 Balance Overall balance assessment: Needs assistance Sitting-balance support: No upper extremity supported, Feet supported Sitting balance-Leahy Scale: Good     Standing balance support: During functional activity, Reliant on assistive device for balance Standing balance-Leahy Scale: Good Standing balance comment: able to let go and stand with unilateral UE support                           ADL either performed or assessed with clinical judgement   ADL Overall ADL's : Needs assistance/impaired                         Toilet Transfer: Supervision/safety;Ambulation;Regular Toilet;BSC/3in1 Statistician Details (indicate cue type and reason): wtih 3in1 over commode         Functional mobility during ADLs: Contact guard assist;Rolling walker (2 wheels)      Extremity/Trunk Assessment Upper Extremity Assessment Upper Extremity Assessment: Overall WFL for tasks assessed   Lower Extremity Assessment Lower Extremity Assessment: Defer to PT evaluation        Vision   Vision Assessment?: No apparent visual deficits   Perception Perception Perception: Not tested   Praxis Praxis Praxis: Not tested   Communication Communication Communication: No apparent difficulties   Cognition Arousal: Alert Behavior During Therapy: WFL for tasks assessed/performed Cognition: No apparent impairments                               Following commands: Intact  Cueing   Cueing Techniques: Verbal cues  Exercises      Shoulder Instructions       General Comments VSS    Pertinent Vitals/ Pain       Pain Assessment Pain Assessment: Faces Pain Score: 6  Faces Pain Scale: Hurts even more Pain Location: left hip after mobility and knee extension Pain Descriptors /  Indicators: Guarding, Grimacing, Sore, Throbbing Pain Intervention(s): Limited activity within patient's tolerance, Monitored during session, Repositioned  Home Living                                          Prior Functioning/Environment              Frequency  Min 2X/week        Progress Toward Goals  OT Goals(current goals can now be found in the care plan section)  Progress towards OT goals: Progressing toward goals  Acute Rehab OT Goals Patient Stated Goal: to take a shower OT Goal Formulation: With patient Time For Goal Achievement: 03/09/24 Potential to Achieve Goals: Good ADL Goals Pt Will Perform Lower Body Bathing: with modified independence;sit to/from stand;sitting/lateral leans;with adaptive equipment Pt Will Perform Upper Body Dressing: sitting;with contact guard assist Pt Will Perform Lower Body Dressing: with modified independence;sitting/lateral leans;with adaptive equipment;sit to/from stand Pt Will Transfer to Toilet: with contact guard assist;ambulating;regular height toilet Additional ADL Goal #1: Pt to manage ADLs/mobility > 10 min standing without seated rest break  Plan      Co-evaluation                 AM-PAC OT "6 Clicks" Daily Activity     Outcome Measure   Help from another person eating meals?: None Help from another person taking care of personal grooming?: A Little Help from another person toileting, which includes using toliet, bedpan, or urinal?: A Little Help from another person bathing (including washing, rinsing, drying)?: A Little Help from another person to put on and taking off regular upper body clothing?: A Little Help from another person to put on and taking off regular lower body clothing?: A Lot 6 Click Score: 18    End of Session Equipment Utilized During Treatment: Rolling walker (2 wheels)  OT Visit Diagnosis: Unsteadiness on feet (R26.81);Other abnormalities of gait and mobility  (R26.89);Muscle weakness (generalized) (M62.81)   Activity Tolerance Patient tolerated treatment well   Patient Left in bed;with call bell/phone within reach;with bed alarm set   Nurse Communication Mobility status;Patient requests pain meds        Time: 4098-1191 OT Time Calculation (min): 21 min  Charges: OT General Charges $OT Visit: 1 Visit OT Treatments $Self Care/Home Management : 8-22 mins  Jhayla Podgorski K, OTD, OTR/L SecureChat Preferred Acute Rehab [phone]   Antionette Kirks 03/04/2024, 4:36 PM

## 2024-03-05 DIAGNOSIS — M869 Osteomyelitis, unspecified: Secondary | ICD-10-CM | POA: Diagnosis not present

## 2024-03-05 DIAGNOSIS — F1120 Opioid dependence, uncomplicated: Secondary | ICD-10-CM | POA: Diagnosis not present

## 2024-03-05 DIAGNOSIS — L03116 Cellulitis of left lower limb: Secondary | ICD-10-CM | POA: Diagnosis not present

## 2024-03-05 DIAGNOSIS — D638 Anemia in other chronic diseases classified elsewhere: Secondary | ICD-10-CM | POA: Diagnosis not present

## 2024-03-05 NOTE — Progress Notes (Signed)
 Mobility Specialist: Progress Note   03/05/24 1400  Mobility  Activity Ambulated with assistance in hallway  Level of Assistance Standby assist, set-up cues, supervision of patient - no hands on  Assistive Device Front wheel walker  Distance Ambulated (ft) 100 ft  LLE Weight Bearing Per Provider Order TWB  Activity Response Tolerated well  Mobility Referral Yes  Mobility visit 1 Mobility  Mobility Specialist Start Time (ACUTE ONLY) 1150  Mobility Specialist Stop Time (ACUTE ONLY) 1207  Mobility Specialist Time Calculation (min) (ACUTE ONLY) 17 min    Pt received in bed, agreeable to mobility session. SV throughout. Still unable to WB through LLE during ambulation. Distance limited d/t LLE pain. Returned to room without fault. Left in bed with pillow between his knees with all needs met, call bell in reach.   Deloria Fetch Mobility Specialist Please contact via SecureChat or Rehab office at [phone]

## 2024-03-05 NOTE — Progress Notes (Signed)
 Progress Note   Patient: Mark Hammond WUJ:[id number] DOB: 01-25-1991 DOA: 02/19/2024  DOS: the patient was seen and examined on 03/05/2024   Brief hospital course:  33 year old M polysubstance use/IVDU was recently discharged on 5/16 after treatment for septic left hip arthritis.  Patient was readmitted with cellulitis of the left foot ankle.  Patient underwent left hip arthroplasty for chronic septic arthritis and osteomyelitis with collapse of femoral head with failed IR drainage and outpatient antibiotic treatment.  He is presently getting treatment for foot cellulitis.   5/27: Hemodynamically stable, will be difficult to place for SNF with history of polysubstance abuse and on Medicaid.  Patient wants to go to rehab for his substance abuse.  Discussed with ID and will continue antibiotics for total of 6 weeks.   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  Wound vac since Dc'd.  Followed closely infectious disease.  Plan for 6 weeks antibiotic course initiating 5/23.  Initial recommendation for 2-4 weeks of IV cefazolin  with transition to p.o. upon discharge.  Plan to reengage ID toward end of first week of June 2025.    Normocytic anemia - Hemoglobin stable s/p 1 unit PRBCs on 5/27.  No active bleeding appreciated.  Will repeat CBC in am.     Left foot cellulitis - Antibiotics as above.   Bipolar disorder - Seroquel  on board.   Acute hypoxic respiratory failure secondary to opioid overdose - Resolved.  Now on room air.   Subjective: Patient feeling well this morning.  Denies any fever, chills, chest pain, nausea, vomiting, abdominal pain.  Denies improvement in his left hip pain stating he does not need to put a pillow in between his knees as often.  Physical Exam:  Vitals:   03/04/24 0837 03/04/24 1948 03/05/24 0449 03/05/24 0739  BP: (!)  105/59 127/76 112/71 (!) 103/58  Pulse: 86 75 (!) 104 88  Resp: 18 20 16 18   Temp: 98.3 F (36.8 C) 98.6 F (37 C) 98.4 F (36.9 C) 98.6 F (37 C)  TempSrc: Oral  Oral   SpO2: 99% 100% 100% 100%  Weight:      Height:        GENERAL:  Alert, pleasant, no acute distress, thin HEENT:  EOMI CARDIOVASCULAR:  RRR, no murmurs appreciated RESPIRATORY:  Clear to auscultation, no wheezing, rales, or rhonchi GASTROINTESTINAL:  Soft, nontender, nondistended EXTREMITIES: Bandage over left hip NEURO:  No new focal deficits appreciated SKIN:  No rashes noted PSYCH:  Appropriate mood and affect    Data Reviewed:  No new imaging to review  Previous records (including but not limited to H&P, progress notes, nursing notes, TOC management) were reviewed in assessment of this patient.  Labs: CBC: Recent Labs  Lab 02/28/24 0055 02/29/24 0732 03/01/24 0813 03/02/24 0424  WBC 7.1 7.3 5.9 8.1  NEUTROABS 3.0  --   --   --   HGB 9.2* 9.7* 9.2* 9.8*  HCT 27.2* 29.4* 28.3* 30.1*  MCV 89.2 89.6 89.8 91.5  PLT 333 418* 362 391   Basic Metabolic Panel: Recent Labs  Lab 02/28/24 0611 02/29/24 0732 03/01/24 0813 03/02/24 0424  NA 135 132* 136 135  K 4.3 3.6 3.3* 3.6  CL 100 99 99 100  CO2 27 24 27 26   GLUCOSE 102* 170* 149* 108*  BUN 10 11 11 8   CREATININE 0.53* 0.56* 0.57* 0.62  CALCIUM 9.4 9.1 9.3 9.0  MG  --  1.7  --  1.7   Liver Function Tests: Recent Labs  Lab 02/28/24 0611 03/02/24 0424  AST 51* 51*  ALT 23 22  ALKPHOS 113 124  BILITOT 0.6 0.6  PROT 7.5 7.8  ALBUMIN  3.6 3.7   CBG: No results for input(s): "GLUCAP" in the last 168 hours.  Scheduled Meds:  aspirin  EC  81 mg Oral BID   docusate sodium   100 mg Oral BID   folic acid   1 mg Oral Daily   magnesium  oxide  400 mg Oral BID   multivitamin with minerals  1 tablet Oral Daily   nicotine   14 mg Transdermal Daily   nystatin    Topical TID   QUEtiapine   50 mg Oral QHS   thiamine   100 mg Oral Daily    Continuous Infusions:   ceFAZolin  (ANCEF ) IV 2 g (03/05/24 1242)   PRN Meds:.acetaminophen , diphenhydrAMINE , HYDROcodone -acetaminophen , HYDROcodone -acetaminophen , hydrOXYzine , magnesium  citrate, methocarbamol  **OR** methocarbamol  (ROBAXIN ) injection, metoCLOPramide  **OR** metoCLOPramide  (REGLAN ) injection, morphine  injection, nicotine  polacrilex, ondansetron  **OR** ondansetron  (ZOFRAN ) IV, polyethylene glycol, sorbitol   Family Communication: None at bedside  Disposition: Status is: Inpatient Remains inpatient appropriate because: Osteomyelitis requiring IV antibiotics     Time spent: 34 minutes  Length of inpatient stay: 14 days  Author: Jodeane Mulligan, DO 03/05/2024 1:03 PM  For on call review [email or web address].

## 2024-03-05 NOTE — Plan of Care (Signed)

## 2024-03-06 DIAGNOSIS — M869 Osteomyelitis, unspecified: Secondary | ICD-10-CM | POA: Diagnosis not present

## 2024-03-06 DIAGNOSIS — D638 Anemia in other chronic diseases classified elsewhere: Secondary | ICD-10-CM | POA: Diagnosis not present

## 2024-03-06 DIAGNOSIS — F1120 Opioid dependence, uncomplicated: Secondary | ICD-10-CM | POA: Diagnosis not present

## 2024-03-06 DIAGNOSIS — L03116 Cellulitis of left lower limb: Secondary | ICD-10-CM | POA: Diagnosis not present

## 2024-03-06 LAB — COMPREHENSIVE METABOLIC PANEL WITH GFR
ALT: 18 U/L (ref 0–44)
AST: 45 U/L — ABNORMAL HIGH (ref 15–41)
Albumin: 3.3 g/dL — ABNORMAL LOW (ref 3.5–5.0)
Alkaline Phosphatase: 121 U/L (ref 38–126)
Anion gap: 6 (ref 5–15)
BUN: 7 mg/dL (ref 6–20)
CO2: 30 mmol/L (ref 22–32)
Calcium: 9.5 mg/dL (ref 8.9–10.3)
Chloride: 99 mmol/L (ref 98–111)
Creatinine, Ser: 0.49 mg/dL — ABNORMAL LOW (ref 0.61–1.24)
GFR, Estimated: >60 mL/min (ref >60)
Glucose, Bld: 102 mg/dL — ABNORMAL HIGH (ref 70–99)
Potassium: 3.8 mmol/L (ref 3.5–5.1)
Sodium: 135 mmol/L (ref 135–145)
Total Bilirubin: 0.6 mg/dL (ref 0.0–1.2)
Total Protein: 7.4 g/dL (ref 6.5–8.1)

## 2024-03-06 LAB — C-REACTIVE PROTEIN: CRP: 0.9 mg/dL (ref <1.0)

## 2024-03-06 LAB — CBC
HCT: 28.7 % — ABNORMAL LOW (ref 39.0–52.0)
Hemoglobin: 9.2 g/dL — ABNORMAL LOW (ref 13.0–17.0)
MCH: 29.8 pg (ref 26.0–34.0)
MCHC: 32.1 g/dL (ref 30.0–36.0)
MCV: 92.9 fL (ref 80.0–100.0)
Platelets: 311 10*3/uL (ref 150–400)
RBC: 3.09 MIL/uL — ABNORMAL LOW (ref 4.22–5.81)
RDW: 14.6 % (ref 11.5–15.5)
WBC: 4.7 10*3/uL (ref 4.0–10.5)
nRBC: 0 % (ref 0.0–0.2)

## 2024-03-06 LAB — SEDIMENTATION RATE: Sed Rate: 44 mm/h — ABNORMAL HIGH (ref 0–16)

## 2024-03-06 NOTE — Plan of Care (Signed)

## 2024-03-06 NOTE — Progress Notes (Signed)
 Physical Therapy Treatment Patient Details Name: Mark Hammond MRN: [id number] DOB: [birth year] Today's Date: 03/06/2024   History of Present Illness 33 y/o male adm 02/19/24 with ongoing LLE pain with MSSA, cellulitis, osteomyelitis and septic arthritis. 5/23 resection of left femoral head, placement of antibiotic hip spacer (posterior approach) with I&D and VAC. PMhx: 4/30-5/16 for osteomyelitis and cellulitis of LLE w/ hip aspiration. ED visit 5/19 for OD. Alcoholic gastritis, BPD, Depression, drug abuse, ETOH abuse, hallucination, schizo affective schizophrenia.    PT Comments  Pt received sitting EOB with visitor (uncle) present, pt agreeable and motivated to participate in transfer and gait training with RW support. Pt needing up to Supervision for transfers, gait and bed mobility with mod cues for LLE hip precautions when rolling and performing bed mobility. Plan to bring platform step to simulate curb step-ups and for UE/LE strengthening next session. Pt is worried about DC back to the streets and relapsing if he does not get placement at a drug rehab facility. Pt continues to benefit from PT services to progress toward functional mobility goals.     If plan is discharge home, recommend the following: Assist for transportation;Assistance with cooking/housework   Can travel by private Scientist, research (medical) walker (2 wheels);Wheelchair (measurements PT);Wheelchair cushion (measurements PT)    Recommendations for Other Services       Precautions / Restrictions Precautions Precautions: Fall;Posterior Hip Precaution Booklet Issued: Yes (comment) Recall of Precautions/Restrictions: Impaired Precaution/Restrictions Comments: with bed mobility pt tending to break 2/3 precs Other Brace: hip abd pillow in room if pt able to tolerate supine position Restrictions Weight Bearing Restrictions Per Provider Order: Yes LLE Weight Bearing Per Provider Order: Touchdown  weight bearing     Mobility  Bed Mobility Overal bed mobility: Needs Assistance Bed Mobility: Rolling, Sit to Sidelying, Sidelying to Sit Rolling: Supervision Sidelying to sit: Supervision     Sit to sidelying: Supervision General bed mobility comments: cues to keep pillow between legs during bed mobility to prevent IR/crossing of legs, pt initially impulsive and internally rotating his LLE prior to PTA being able to instruct him. Pt uncle present and receptive to education and pt allows pillow to be placed prior to continuing to move to other side of bed when rolling.    Transfers Overall transfer level: Needs assistance Equipment used: Rolling walker (2 wheels) Transfers: Sit to/from Stand Sit to Stand: Supervision           General transfer comment: EOB<>RW, cues for line awareness/safety.    Ambulation/Gait Ambulation/Gait assistance: Supervision Gait Distance (Feet): 155 Feet Assistive device: Rolling walker (2 wheels) Gait Pattern/deviations: Knee flexed in stance - left       General Gait Details: pt with NWB LLE throughout and good use and control of RW. pt attempting a few steps on LLE via TDWB but starts full body shaking each step due to discomfort, so did not continue after ~71ft. No LOB this date. Pt reports his palms feel sweaty, may need to apply some sort of toweling to RW handles next session for padding/comfort.   Stairs Stairs:  (plan to bring platform step to practice curb transfers next session)           Wheelchair Mobility     Tilt Bed    Modified Rankin (Stroke Patients Only)       Balance Overall balance assessment: Needs assistance Sitting-balance support: No upper extremity supported, Feet supported Sitting balance-Leahy Scale: Good  Standing balance support: During functional activity, Reliant on assistive device for balance Standing balance-Leahy Scale: Good Standing balance comment: able to let go and stand with  unilateral UE support                            Communication Communication Communication: No apparent difficulties  Cognition Arousal: Alert Behavior During Therapy: WFL for tasks assessed/performed   PT - Cognitive impairments: Safety/Judgement, Problem solving, Memory                       PT - Cognition Comments: pt able to recall precautions but remains with limited ability to apply them during mobility needing cues for safety and positioning. pt with significant tendency for internal rotation and appears irritated when PTA shows him what he needs to do and why. Pt Uncle Bambi Lever is in room and receptive to instruction given. Following commands: Intact      Cueing Cueing Techniques: Verbal cues  Exercises      General Comments General comments (skin integrity, edema, etc.): pt still has wound vac with empty canister, MD notified by case mgr no visible drainage and also notified PTA she will be making call to ortho MD. No acute s/sx distress      Pertinent Vitals/Pain Pain Assessment Pain Assessment: 0-10 Pain Score: 7  Pain Location: L hip, worst with attempt at [facility] during gait and post-exertion Pain Descriptors / Indicators: Guarding, Grimacing, Sore, Throbbing Pain Intervention(s): Monitored during session, Repositioned, Patient requesting pain meds-RN notified (offered ice pack but pt defers)    Home Living                          Prior Function            PT Goals (current goals can now be found in the care plan section) Acute Rehab PT Goals Patient Stated Goal: decr pain and to be more independent so I can go to substance abuse rehab PT Goal Formulation: With patient Time For Goal Achievement: 03/10/24 Progress towards PT goals: Progressing toward goals    Frequency    Min 2X/week      PT Plan      Co-evaluation              AM-PAC PT "6 Clicks" Mobility   Outcome Measure  Help needed turning from your back to  your side while in a flat bed without using bedrails?: A Little (due to precautions) Help needed moving from lying on your back to sitting on the side of a flat bed without using bedrails?: A Little Help needed moving to and from a bed to a chair (including a wheelchair)?: A Little Help needed standing up from a chair using your arms (e.g., wheelchair or bedside chair)?: A Little Help needed to walk in hospital room?: A Little Help needed climbing 3-5 steps with a railing? : Total 6 Click Score: 16    End of Session Equipment Utilized During Treatment: Gait belt Activity Tolerance: Patient tolerated treatment well Patient left: in bed;with call bell/phone within reach;with family/visitor present Nurse Communication: Mobility status;Patient requests pain meds PT Visit Diagnosis: Other abnormalities of gait and mobility (R26.89);Pain Pain - Right/Left: Left Pain - part of body: Hip     Time: 2956-2130 PT Time Calculation (min) (ACUTE ONLY): 14 min  Charges:    $Therapeutic Activity: 8-22 mins PT General Charges $$  ACUTE PT VISIT: 1 Visit                     Janesia Joswick P., PTA Acute Rehabilitation Services Secure Chat Preferred 9a-5:30pm Office: [phone]    Mariel Shope [facility] 03/06/2024, 3:15 PM

## 2024-03-06 NOTE — TOC Progression Note (Addendum)
 Transition of Care [facility]) - Progression Note    Patient Details  Name: Mark Hammond MRN: [id number] Date of Birth: 05-14-1991  Transition of Care [facility]) CM/SW Contact  Dane Dung, RN Phone Number: 03/06/2024, 3:12 PM  Clinical Narrative:    CM noted that patient is ambulating in the hallway with PT and wound vac still remains in place at this time.  I spoke with attending MD and asked about wound vac needs.  Ortho is following for wound vac - but no note since 5/25 from [place], [name].  I called and left a detailed voicemail for PA regarding need for wound vac or note.    Patient will need to be mobile without use of wound vac and RW before patient can find placement at inpatient drug rehabilitation facility.  Patient was homeless and living outside in the community prior to this admission and wound benefit from Inpatient drug rehabilitation post-hospitalization.  03/06/24 1530 - I spoke with Rickford Charnley, PA with Dr. Christiane Cowing and she plans to round on the patient in the morning and may likely remove the wound vac and remove the patient's stitches at the bedside.  Patient is an incisional wound vac that ortho planned to leave on post surgery until reach to remove.        Expected Discharge Plan and Services                                               Social Determinants of Health (SDOH) Interventions SDOH Screenings   Food Insecurity: Food Insecurity Present (02/20/2024)  Housing: High Risk (02/20/2024)  Transportation Needs: Unmet Transportation Needs (02/20/2024)  Utilities: At Risk (02/20/2024)  Alcohol Screen: High Risk (02/23/2023)  Depression (PHQ2-9): Low Risk  (01/17/2024)  Recent Concern: Depression (PHQ2-9) - High Risk (01/14/2024)  Social Connections: Unknown (02/20/2024)  Tobacco Use: High Risk (02/23/2024)    Readmission Risk Interventions    02/26/2024    4:21 PM 02/20/2024    3:21 PM 02/02/2024   11:06 AM  Readmission Risk Prevention Plan   Transportation Screening Complete Complete Complete  Medication Review Oceanographer) Complete Complete Complete  PCP or Specialist appointment within 3-5 days of discharge Complete Complete Complete  HRI or Home Care Consult Complete Complete Complete  SW Recovery Care/Counseling Consult Not Complete Complete Complete  Palliative Care Screening Not Applicable Not Applicable Not Applicable  Skilled Nursing Facility Not Applicable Not Applicable Not Applicable

## 2024-03-06 NOTE — Progress Notes (Signed)
 Progress Note   Patient: Mark Hammond ZOX:[id number] DOB: 15-Feb-1991 DOA: 02/19/2024     15 DOS: the patient was seen and examined on 03/06/2024   Brief hospital course: 33 year old M polysubstance use/IVDU was recently discharged on 5/16 after treatment for septic left hip arthritis.  Patient was readmitted with cellulitis of the left foot ankle.  Patient underwent left hip arthroplasty for chronic septic arthritis and osteomyelitis with collapse of femoral head with failed IR drainage and outpatient antibiotic treatment.  He is presently getting treatment for foot cellulitis.   5/27: Hemodynamically stable, will be difficult to place for SNF with history of polysubstance abuse and on Medicaid.  Patient wants to go to rehab for his substance abuse.  Discussed with ID and will continue antibiotics for total of 6 weeks.  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 Query ortho team if wound vac to be discontinued.  Plan for 6 weeks antibiotic course initiating 5/23.  Initial recommendation for 2-4 weeks of IV cefazolin  with transition to p.o. upon discharge.  Plan to reengage ID toward end of first week of June 2025.    Normocytic anemia Hemoglobin stable s/p 1 unit PRBCs on 5/27.  No active bleeding appreciated.  Follow h/h.     Left foot cellulitis Antibiotics as above.   Bipolar disorder Seroquel  on board.   Acute hypoxic respiratory failure secondary to opioid overdose Resolved.  Now on room air.      Out of bed to chair. Incentive spirometry. Nursing supportive care. Fall, aspiration precautions. Diet:  Diet Orders (From admission, onward)     Start     Ordered   02/23/24 2029  Diet regular Room service appropriate? Yes; Fluid consistency: Thin  Diet effective now       Question Answer Comment  Room service appropriate? Yes   Fluid consistency:  Thin      02/23/24 2028           DVT prophylaxis: SCDs Start: 02/23/24 2029  Level of care: Med-Surg   Code Status: Full Code  Subjective: Patient is seen and examined today morning. He is lying in bed watching his phone. Wishes to go rehab upon discharge. Denies any complaints.  Physical Exam: Vitals:   03/05/24 1648 03/05/24 1944 03/06/24 0423 03/06/24 0842  BP: 110/71 (!) 129/92 104/66 (!) 106/58  Pulse: 90 85 83 83  Resp: 18 18 18 18   Temp: 98.3 F (36.8 C) 98.8 F (37.1 C) 98.1 F (36.7 C) 98 F (36.7 C)  TempSrc:      SpO2: 100% 100% 98% 100%  Weight:      Height:        General -  Young  Caucasian male, no apparent distress HEENT - PERRLA, EOMI, atraumatic head, non tender sinuses. Lung - Clear, no rales, rhonchi, wheezes. Heart - S1, S2 heard, no murmurs, rubs, no pedal edema. Abdomen - Soft, non tender, bowel sounds good Neuro - Alert, awake and oriented x 3, non focal exam. Skin - Warm and dry.  Data Reviewed:      Latest Ref Rng & Units 03/06/2024    5:43 AM 03/02/2024    4:24 AM 03/01/2024    8:13 AM  CBC  WBC 4.0 - 10.5 K/uL 4.7  8.1  5.9   Hemoglobin 13.0 - 17.0 g/dL 9.2  9.8  9.2   Hematocrit 39.0 - 52.0 % 28.7  30.1  28.3   Platelets 150 - 400 K/uL 311  391  362       Latest Ref Rng & Units 03/06/2024    5:43 AM 03/02/2024    4:24 AM 03/01/2024    8:13 AM  BMP  Glucose 70 - 99 mg/dL 161  096  045   BUN 6 - 20 mg/dL 7  8  11    Creatinine 0.61 - 1.24 mg/dL 4.09  8.11  9.14   Sodium 135 - 145 mmol/L 135  135  136   Potassium 3.5 - 5.1 mmol/L 3.8  3.6  3.3   Chloride 98 - 111 mmol/L 99  100  99   CO2 22 - 32 mmol/L 30  26  27    Calcium 8.9 - 10.3 mg/dL 9.5  9.0  9.3    No results found.  Family Communication: Discussed with patient, he understand and agree. All questions answered.  Disposition: Status is: Inpatient Remains inpatient appropriate because: IV antibiotics, placement  Planned Discharge Destination: Rehab     Time  spent: 38 minutes  Author: Aisha Hove, MD 03/06/2024 3:08 PM Secure chat 7am to 7pm For on call review [email or web address].

## 2024-03-07 ENCOUNTER — Other Ambulatory Visit: Payer: Self-pay | Admitting: Physician Assistant

## 2024-03-07 DIAGNOSIS — L03116 Cellulitis of left lower limb: Secondary | ICD-10-CM | POA: Diagnosis not present

## 2024-03-07 DIAGNOSIS — D638 Anemia in other chronic diseases classified elsewhere: Secondary | ICD-10-CM | POA: Diagnosis not present

## 2024-03-07 DIAGNOSIS — Z96642 Presence of left artificial hip joint: Secondary | ICD-10-CM

## 2024-03-07 DIAGNOSIS — F1120 Opioid dependence, uncomplicated: Secondary | ICD-10-CM | POA: Diagnosis not present

## 2024-03-07 DIAGNOSIS — M869 Osteomyelitis, unspecified: Secondary | ICD-10-CM | POA: Diagnosis not present

## 2024-03-07 NOTE — Plan of Care (Signed)

## 2024-03-07 NOTE — Progress Notes (Signed)
 Progress Note   Patient: Mark Hammond WUJ:[id number] DOB: 01/28/1991 DOA: 02/19/2024     16 DOS: the patient was seen and examined on 03/07/2024   Brief hospital course: 33 year old M polysubstance use/IVDU was recently discharged on 5/16 after treatment for septic left hip arthritis.  Patient was readmitted with cellulitis of the left foot ankle.  Patient underwent left hip arthroplasty for chronic septic arthritis and osteomyelitis with collapse of femoral head with failed IR drainage and outpatient antibiotic treatment.  He is presently getting treatment for foot cellulitis.   5/27: Hemodynamically stable, will be difficult to place for SNF with history of polysubstance abuse and on Medicaid.  Patient wants to go to rehab for his substance abuse.  Discussed with ID and will continue antibiotics for total of 6 weeks.  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 Query ortho team if wound vac to be discontinued.  Plan for 6 weeks antibiotic course initiating 5/23.  Initial recommendation for 2-4 weeks of IV cefazolin  with transition to p.o. upon discharge.  Will call ID next week for further guidance on antibiotic regimen as it will be more than 2 weeks of IV abx.    Normocytic anemia Hemoglobin stable s/p 1 unit PRBCs on 5/27.  No active bleeding appreciated.  Follow h/h.     Left foot cellulitis Antibiotics as above.   Bipolar disorder Seroquel  on board.   Acute hypoxic respiratory failure secondary to opioid overdose Resolved.  Now on room air.      Out of bed to chair. Incentive spirometry. Nursing supportive care. Fall, aspiration precautions. Diet:  Diet Orders (From admission, onward)     Start     Ordered   02/23/24 2029  Diet regular Room service appropriate? Yes; Fluid consistency: Thin  Diet effective now       Question Answer Comment   Room service appropriate? Yes   Fluid consistency: Thin      02/23/24 2028           DVT prophylaxis: SCDs Start: 02/23/24 2029  Level of care: Med-Surg   Code Status: Full Code  Subjective: Patient is seen and examined today morning. He is lying in bed watching his phone. Wound vac removed. Denies pain, wishes rehab discharge.  Physical Exam: Vitals:   03/06/24 1601 03/06/24 2032 03/07/24 0510 03/07/24 0827  BP: 123/86 127/80 107/71 109/69  Pulse: 85 90 85 96  Resp: 18 19 18 17   Temp: 98 F (36.7 C) 98 F (36.7 C) 98.2 F (36.8 C) 97.9 F (36.6 C)  TempSrc:      SpO2: 99% 100% 100% 100%  Weight:      Height:        General -  Young  Caucasian male, no apparent distress HEENT - PERRLA, EOMI, atraumatic head, non tender sinuses. Lung - Clear, no rales, rhonchi, wheezes. Heart - S1, S2 heard, no murmurs, rubs, no pedal edema. Abdomen - Soft, non tender, bowel sounds good Neuro - Alert, awake and oriented x 3, non focal exam. Skin - Warm and dry. Left hip staples clean noted.  Data Reviewed:      Latest Ref Rng & Units 03/06/2024    5:43 AM 03/02/2024    4:24 AM 03/01/2024    8:13 AM  CBC  WBC 4.0 - 10.5 K/uL 4.7  8.1  5.9   Hemoglobin 13.0 - 17.0 g/dL 9.2  9.8  9.2   Hematocrit 39.0 - 52.0 % 28.7  30.1  28.3   Platelets 150 - 400 K/uL 311  391  362       Latest Ref Rng & Units 03/06/2024    5:43 AM 03/02/2024    4:24 AM 03/01/2024    8:13 AM  BMP  Glucose 70 - 99 mg/dL 829  562  130   BUN 6 - 20 mg/dL 7  8  11    Creatinine 0.61 - 1.24 mg/dL 8.65  7.84  6.96   Sodium 135 - 145 mmol/L 135  135  136   Potassium 3.5 - 5.1 mmol/L 3.8  3.6  3.3   Chloride 98 - 111 mmol/L 99  100  99   CO2 22 - 32 mmol/L 30  26  27    Calcium 8.9 - 10.3 mg/dL 9.5  9.0  9.3    No results found.  Family Communication: Discussed with patient, he understand and agree. All questions answered.  Disposition: Status is: Inpatient Remains inpatient appropriate because: IV  antibiotics, placement  Planned Discharge Destination: Rehab     Time spent: 36 minutes  Author: Aisha Hove, MD 03/07/2024 2:47 PM Secure chat 7am to 7pm For on call review [email or web address].

## 2024-03-07 NOTE — Progress Notes (Signed)
 Mobility Specialist: Progress Note   03/07/24 1419  Mobility  Activity Ambulated with assistance in hallway  Level of Assistance Standby assist, set-up cues, supervision of patient - no hands on  Assistive Device Front wheel walker  Distance Ambulated (ft) 400 ft  LLE Weight Bearing Per Provider Order TWB  Activity Response Tolerated well  Mobility Referral Yes  Mobility visit 1 Mobility  Mobility Specialist Start Time (ACUTE ONLY) 1138  Mobility Specialist Stop Time (ACUTE ONLY) 1156  Mobility Specialist Time Calculation (min) (ACUTE ONLY) 18 min    Pt received in bed, agreeable to mobility session. SV throughout. C/o L hip pain as well as pain and swelling in L ankle. Took 2x brief standing breaks d/t LLE pain and RLE muscle fatigue. RLE began to shake with further ambulation. Returned to room without fault. Left in bed with pillow between his knees, all needs met, call bell in reach.   Deloria Fetch Mobility Specialist Please contact via SecureChat or Rehab office at [phone]

## 2024-03-07 NOTE — Discharge Instructions (Signed)
 Touch down weight bearing to the left lower extremity. Posterior hip precautions Take blood thinners as prescribed

## 2024-03-07 NOTE — Progress Notes (Signed)
 Subjective: 13 Days Post-Op Procedure(s) (LRB): ARTHROPLASTY, HIP, TOTAL,POSTERIOR APPROACH (Left) Patient reports pain as mild.    Objective: Vital signs in last 24 hours: Temp:  [97.9 F (36.6 C)-98.2 F (36.8 C)] 97.9 F (36.6 C) (06/05 0827) Pulse Rate:  [85-96] 96 (06/05 0827) Resp:  [17-19] 17 (06/05 0827) BP: (107-127)/(69-86) 109/69 (06/05 0827) SpO2:  [99 %-100 %] 100 % (06/05 0827)  Intake/Output from previous day: 06/04 0701 - 06/05 0700 In: 2581.3 [P.O.:2300; IV Piggyback:281.3] Out: 3300 [Urine:3300] Intake/Output this shift: No intake/output data recorded.  Recent Labs    03/06/24 0543  HGB 9.2*   Recent Labs    03/06/24 0543  WBC 4.7  RBC 3.09*  HCT 28.7*  PLT 311   Recent Labs    03/06/24 0543  NA 135  K 3.8  CL 99  CO2 30  BUN 7  CREATININE 0.49*  GLUCOSE 102*  CALCIUM 9.5   No results for input(s): "LABPT", "INR" in the last 72 hours.  Neurologically intact Neurovascular intact Sensation intact distally Intact pulses distally Dorsiflexion/Plantar flexion intact Incision: dressing C/D/I No cellulitis present Compartment soft Ivac in place with good seal.  No fluid in canister  Assessment/Plan: 13 Days Post-Op Procedure(s) (LRB): ARTHROPLASTY, HIP, TOTAL,POSTERIOR APPROACH (Left) Up with therapy TDWB LLE- posterior hip precautions Ivac discontinued by me toda Order placed for staple removal.  Please apply steri strips Continue abx per primary team/ID Follow up with orthopedics in 4 weeks for xrays.     Sandie Cross 03/07/2024, 10:00 AM

## 2024-03-08 DIAGNOSIS — F1120 Opioid dependence, uncomplicated: Secondary | ICD-10-CM | POA: Diagnosis not present

## 2024-03-08 DIAGNOSIS — D638 Anemia in other chronic diseases classified elsewhere: Secondary | ICD-10-CM | POA: Diagnosis not present

## 2024-03-08 DIAGNOSIS — L03116 Cellulitis of left lower limb: Secondary | ICD-10-CM | POA: Diagnosis not present

## 2024-03-08 DIAGNOSIS — M869 Osteomyelitis, unspecified: Secondary | ICD-10-CM | POA: Diagnosis not present

## 2024-03-08 NOTE — TOC Progression Note (Signed)
 Transition of Care [facility]) - Progression Note    Patient Details  Name: Mark Hammond MRN: [id number] Date of Birth: 04/14/1991  [facility]) CM/SW Contact  Dimitrious Micciche A [place], LCSW Phone Number: 03/08/2024, 4:05 PM  Clinical Narrative:     CSW met with pt at bedside to discuss disposition plan. He is still wanting residential treatment at discharge. CSW provided 2 referral placement of [facility] and [facility] and application for him to complete and explained need for pt to be able to complete ADL's to fit criteria for admission, pt acknowledged understanding, was familiar with facilities' policy. CSW to send clinicals and referrals once completed. Awaiting pt to continue improvement with ADL's and IV Abx course before can DC to residential treatment.       TOC will continue to follow.   Expected Discharge Plan and Services                                               Social Determinants of Health (SDOH) Interventions SDOH Screenings   Food Insecurity: Food Insecurity Present (02/20/2024)  Housing: High Risk (02/20/2024)  Transportation Needs: Unmet Transportation Needs (02/20/2024)  Utilities: At Risk (02/20/2024)  Alcohol Screen: High Risk (02/23/2023)  Depression (PHQ2-9): Low Risk  (01/17/2024)  Recent Concern: Depression (PHQ2-9) - High Risk (01/14/2024)  Social Connections: Unknown (02/20/2024)  Tobacco Use: High Risk (02/23/2024)    Readmission Risk Interventions    02/26/2024    4:21 PM 02/20/2024    3:21 PM 02/02/2024   11:06 AM  Readmission Risk Prevention Plan  Transportation Screening Complete Complete Complete  Medication Review Oceanographer) Complete Complete Complete  PCP or Specialist appointment within 3-5 days of discharge Complete Complete Complete  HRI or Home Care Consult Complete Complete Complete  SW Recovery Care/Counseling Consult Not Complete Complete Complete  Palliative Care Screening Not Applicable Not Applicable Not Applicable  Skilled  Nursing Facility Not Applicable Not Applicable Not Applicable

## 2024-03-08 NOTE — Progress Notes (Signed)
 Progress Note   Patient: Mark Hammond WUJ:[id number] DOB: April 29, 1991 DOA: 02/19/2024     17 DOS: the patient was seen and examined on 03/08/2024   Brief hospital course: 33 year old M polysubstance use/IVDU was recently discharged on 5/16 after treatment for septic left hip arthritis.  Patient was readmitted with cellulitis of the left foot ankle.  Patient underwent left hip arthroplasty for chronic septic arthritis and osteomyelitis with collapse of femoral head with failed IR drainage and outpatient antibiotic treatment.  He is presently getting treatment for foot cellulitis.   5/27: Hemodynamically stable, will be difficult to place for SNF with history of polysubstance abuse and on Medicaid.  Patient wants to go to rehab for his substance abuse.  Discussed with ID and will continue antibiotics for total of 6 weeks.  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 Query ortho team if wound vac to be discontinued.  Plan for 6 weeks antibiotic course initiating 5/23.  Initial recommendation for 2-4 weeks of IV cefazolin  with transition to p.o. upon discharge.  ID follow up next week for further guidance on antibiotic regimen as it will be more than 2 weeks of IV abx.    Normocytic anemia Hemoglobin stable s/p 1 unit PRBCs on 5/27.  No active bleeding appreciated.  Follow h/h.     Left foot cellulitis Antibiotics as above.   Bipolar disorder Seroquel  on board.   Acute hypoxic respiratory failure secondary to opioid overdose Resolved.  Now on room air.      Out of bed to chair. Incentive spirometry. Nursing supportive care. Fall, aspiration precautions. Diet:  Diet Orders (From admission, onward)     Start     Ordered   02/23/24 2029  Diet regular Room service appropriate? Yes; Fluid consistency: Thin  Diet effective now       Question Answer Comment   Room service appropriate? Yes   Fluid consistency: Thin      02/23/24 2028           DVT prophylaxis: SCDs Start: 02/23/24 2029  Level of care: Med-Surg   Code Status: Full Code  Subjective: Patient is seen and examined today morning. He did not get out of bed. Left hip wound clean. Denies pain.  Physical Exam: Vitals:   03/07/24 1721 03/07/24 2038 03/08/24 0430 03/08/24 0808  BP: 108/64 126/74 (!) 99/57 111/63  Pulse: 72 70 79 (!) 102  Resp: 18 20 20 18   Temp: 98.6 F (37 C) 98.5 F (36.9 C) 98.5 F (36.9 C) 98.5 F (36.9 C)  TempSrc:      SpO2: 100% 100% 100% 99%  Weight:      Height:        General -  Young  Caucasian male, no apparent distress HEENT - PERRLA, EOMI, atraumatic head, non tender sinuses. Lung - Clear, no rales, rhonchi, wheezes. Heart - S1, S2 heard, no murmurs, rubs, no pedal edema. Abdomen - Soft, non tender, bowel sounds good Neuro - Alert, awake and oriented x 3, non focal exam. Skin - Warm and dry. Left hip surgical staples clean noted.  Data Reviewed:      Latest Ref Rng & Units 03/06/2024    5:43 AM 03/02/2024    4:24 AM 03/01/2024    8:13 AM  CBC  WBC 4.0 - 10.5 K/uL 4.7  8.1  5.9   Hemoglobin 13.0 - 17.0 g/dL 9.2  9.8  9.2   Hematocrit 39.0 - 52.0 % 28.7  30.1  28.3   Platelets 150 - 400 K/uL 311  391  362       Latest Ref Rng & Units 03/06/2024    5:43 AM 03/02/2024    4:24 AM 03/01/2024    8:13 AM  BMP  Glucose 70 - 99 mg/dL 644  034  742   BUN 6 - 20 mg/dL 7  8  11    Creatinine 0.61 - 1.24 mg/dL 5.95  6.38  7.56   Sodium 135 - 145 mmol/L 135  135  136   Potassium 3.5 - 5.1 mmol/L 3.8  3.6  3.3   Chloride 98 - 111 mmol/L 99  100  99   CO2 22 - 32 mmol/L 30  26  27    Calcium 8.9 - 10.3 mg/dL 9.5  9.0  9.3    No results found.  Family Communication: Discussed with patient, he understand and agree. All questions answered.  Disposition: Status is: Inpatient Remains inpatient appropriate because: IV antibiotics,  placement  Planned Discharge Destination: Rehab     Time spent: 37 minutes  Author: Aisha Hove, MD 03/08/2024 2:07 PM Secure chat 7am to 7pm For on call review [email or web address].

## 2024-03-08 NOTE — Progress Notes (Signed)
 Physical Therapy Treatment Patient Details Name: Mark Hammond MRN: [id number] DOB: [birth year] Today's Date: 03/08/2024   History of Present Illness 33 y/o male adm 02/19/24 with ongoing LLE pain with MSSA, cellulitis, osteomyelitis and septic arthritis. 5/23 resection of left femoral head, placement of antibiotic hip spacer (posterior approach) with I&D and VAC. PMhx: 4/30-5/16 for osteomyelitis and cellulitis of LLE w/ hip aspiration. ED visit 5/19 for OD. Alcoholic gastritis, BPD, Depression, drug abuse, ETOH abuse, hallucination, schizo affective schizophrenia.    PT Comments  Pt received in sidelying after recently mobilizing in hallway per RN, pt agreeable to therapy session with emphasis on stair negotiation to simulate curb step in room. Pt needing CGA for safety with hopping up/down platform step and good compliance with LLE TWB precs and posterior hip precs during all functional tasks this date. Pt did not need physical assist to perform bed mobility. Pt reports he recently sat in chair for 30 mins but did need assist to stand from chair after that due to pain. Patient will benefit from continued inpatient follow up therapy, <3 hours/day     If plan is discharge home, recommend the following: Assist for transportation;Assistance with cooking/housework   Can travel by private vehicle        Equipment Recommendations  Rolling walker (2 wheels);Wheelchair (measurements PT);Wheelchair cushion (measurements PT)    Recommendations for Other Services       Precautions / Restrictions Precautions Precautions: Fall;Posterior Hip Precaution Booklet Issued: Yes (comment) Recall of Precautions/Restrictions: Impaired Other Brace: hip abd pillow in room if pt able to tolerate supine position Restrictions Weight Bearing Restrictions Per Provider Order: Yes LLE Weight Bearing Per Provider Order: Touchdown weight bearing     Mobility  Bed Mobility Overal bed mobility: Modified Independent Bed  Mobility: Sidelying to Sit, Sit to Sidelying Rolling: Modified independent (Device/Increase time)       Sit to sidelying: Modified independent (Device/Increase time) General bed mobility comments: from flat bed, no cues needed after initial review of PHP    Transfers Overall transfer level: Needs assistance Equipment used: Rolling walker (2 wheels) Transfers: Sit to/from Stand Sit to Stand: Supervision           General transfer comment: EOB<>RW, cues for safer UE placement (pt pulling up on IV pole to stand but RW would be safer than IV pole on wheels)    Ambulation/Gait Ambulation/Gait assistance: Supervision Gait Distance (Feet): 15 Feet Assistive device: Rolling walker (2 wheels) Gait Pattern/deviations: Knee flexed in stance - left       General Gait Details: pt with NWB LLE throughout and good use and control of RW. Pt recently walked in hallway ad lib but agreeable to short distance at bedside to perform stair negotiation, see below   Stairs Stairs: Yes Stairs assistance: Contact guard assist Stair Management: Step to pattern, Backwards, Forwards, With walker Number of Stairs: 1 General stair comments: single 7" platform step in room x5 reps to simulate curb transfer via backward hop-up and forward hop-down, defer having pt place RW up on step due to small size of platform. Good technique after initial demo and no over LOB or buckling, pt able to tolerate slight TWB when stepping down   Wheelchair Mobility     Tilt Bed    Modified Rankin (Stroke Patients Only)       Balance Overall balance assessment: Needs assistance Sitting-balance support: No upper extremity supported, Feet supported Sitting balance-Leahy Scale: Good     Standing balance support: During functional  activity, Reliant on assistive device for balance Standing balance-Leahy Scale: Poor Standing balance comment: Poor due to L TWB status; Good with RW, poor without                             Communication Communication Communication: No apparent difficulties  Cognition Arousal: Alert Behavior During Therapy: WFL for tasks assessed/performed   PT - Cognitive impairments: No apparent impairments                       PT - Cognition Comments: Pt with improved compliance with precs today and recalls 2/3 Following commands: Intact      Cueing Cueing Techniques: Verbal cues  Exercises      General Comments General comments (skin integrity, edema, etc.): Encouraged pt to work on supine posture to build tolerance but pt reports too painful, pt in sidelying with pillow between his knees at end of session      Pertinent Vitals/Pain Pain Assessment Pain Assessment: Faces Faces Pain Scale: Hurts even more Pain Location: L hip with TDWB Pain Descriptors / Indicators: Grimacing, Guarding, Throbbing, Stabbing, Sharp, Dull Pain Intervention(s): Limited activity within patient's tolerance, Monitored during session, Premedicated before session, Repositioned (pt defers ice)    Home Living                          Prior Function            PT Goals (current goals can now be found in the care plan section) Acute Rehab PT Goals Patient Stated Goal: decr pain and to be more independent so I can go to substance abuse rehab PT Goal Formulation: With patient Time For Goal Achievement: 03/10/24 Progress towards PT goals: Progressing toward goals    Frequency    Min 2X/week      PT Plan      Co-evaluation              AM-PAC PT "6 Clicks" Mobility   Outcome Measure  Help needed turning from your back to your side while in a flat bed without using bedrails?: None Help needed moving from lying on your back to sitting on the side of a flat bed without using bedrails?: None Help needed moving to and from a bed to a chair (including a wheelchair)?: A Little Help needed standing up from a chair using your arms (e.g., wheelchair or  bedside chair)?: A Little Help needed to walk in hospital room?: A Little Help needed climbing 3-5 steps with a railing? : A Lot (w/single rail) 6 Click Score: 19    End of Session Equipment Utilized During Treatment: Gait belt Activity Tolerance: Patient tolerated treatment well;Patient limited by pain Patient left: in bed;with call bell/phone within reach;Other (comment) (bed alarm has been off today per RN, pt mobilizing with supervision/modI) Nurse Communication: Mobility status PT Visit Diagnosis: Other abnormalities of gait and mobility (R26.89);Pain Pain - Right/Left: Left Pain - part of body: Hip     Time: 6962-9528 PT Time Calculation (min) (ACUTE ONLY): 8 min  Charges:    $Gait Training: 8-22 mins PT General Charges $$ ACUTE PT VISIT: 1 Visit                     Pinchos Topel P., PTA Acute Rehabilitation Services Secure Chat Preferred 9a-5:30pm Office: [phone]    Arville Laughter 03/08/2024, 4:38 PM

## 2024-03-08 NOTE — Progress Notes (Signed)
 PT Cancellation Note  Patient Details Name: Audel Coakley MRN: [id number] DOB: 10-10-1990   Cancelled Treatment:    Reason Eval/Treat Not Completed: (P) Other (comment) (pt received on toilet in bathroom, requesting more time >5 mins, RN notified and pt encouraged to pull wall call bell when done so staff can assist him back.) Will continue efforts per PT plan of care as schedule permits.   Phoenyx Melka M Cypress Hinkson 03/08/2024, 3:12 PM

## 2024-03-08 NOTE — Progress Notes (Signed)
 Occupational Therapy Treatment Patient Details Name: Mark Hammond MRN: [id number] DOB: Feb 06, 1991 Today's Date: 03/08/2024   History of present illness 33 y/o male adm 02/19/24 with ongoing LLE pain with MSSA, cellulitis, osteomyelitis and septic arthritis. 5/23 resection of left femoral head, placement of antibiotic hip spacer (posterior approach) with I&D and VAC. PMhx: 4/30-5/16 for osteomyelitis and cellulitis of LLE w/ hip aspiration. ED visit 5/19 for OD. Alcoholic gastritis, BPD, Depression, drug abuse, ETOH abuse, hallucination, schizo affective schizophrenia.   OT comments  Pt. Seen for skilled OT treatment session.  Able to complete bed mobilty in/out of bed for standing grooming task and ambulation to/from b.room lb dressing with use of sock aide set up seated eob.  Encouragment for TWB during ambulation and pt. Was receptive and able to demo use of LE during ambulation with appropriate wbs.  Pt. Also agreeable to use of abd pillow at end of session.  Progressing well with acute OT goals.  Cont. With acute OT POC.       If plan is discharge home, recommend the following:  Assistance with cooking/housework;Direct supervision/assist for medications management;Assist for transportation;Help with stairs or ramp for entrance;A little help with bathing/dressing/bathroom   Equipment Recommendations  BSC/3in1;Other (comment)    Recommendations for Other Services      Precautions / Restrictions Precautions Precautions: Fall;Posterior Hip Recall of Precautions/Restrictions: Impaired Precaution/Restrictions Comments: with bed mobility pt tending to break 2/3 precs Other Brace: hip abd pillow in room if pt able to tolerate supine position Restrictions LLE Weight Bearing Per Provider Order: Touchdown weight bearing       Mobility Bed Mobility   Bed Mobility: Rolling, Sit to Sidelying, Sidelying to Sit Rolling: Supervision Sidelying to sit: Supervision     Sit to sidelying:  Supervision General bed mobility comments: pt. agreeable to abductor pillow placement and was in supine with abductor placed and states he will try for 30 min. rn also updated that he will need assistance removing it    Transfers Overall transfer level: Needs assistance Equipment used: Rolling walker (2 wheels) Transfers: Sit to/from Stand, Bed to chair/wheelchair/BSC, cues to attempt TWB. Pt. Able to utilize and performed short distance ambulation with TWB to/from b.room Sit to Stand: Supervision     Step pivot transfers: Contact guard assist     General transfer comment: EOB<>RW, cues for line awareness/safety.     Balance                                           ADL either performed or assessed with clinical judgement   ADL Overall ADL's : Needs assistance/impaired     Grooming: Oral care;Standing;Contact guard assist               Lower Body Dressing: Set up;Cueing for sequencing;With adaptive equipment;Sitting/lateral leans Lower Body Dressing Details (indicate cue type and reason): able to don B socks seated eob with use of sock aide Toilet Transfer: Supervision/safety;Ambulation;Regular Toilet;BSC/3in1 Statistician Details (indicate cue type and reason): stood at commode but did not need to use it         Functional mobility during ADLs: Contact guard assist;Rolling walker (2 wheels) General ADL Comments: education on undertanding pain management and that he will feel his L hip/LE with movement and needs to begin to try and sit w/o leaning and try twb to learn how the LLE is going to  feel vs. avoiding movements and use of it    Extremity/Trunk Assessment              Vision       Perception     Praxis     Communication Communication Communication: No apparent difficulties   Cognition Arousal: Alert Behavior During Therapy: WFL for tasks assessed/performed Cognition: No apparent impairments             OT - Cognition  Comments: Pt able to fully participate, pleasant and conversational, recited 3/3 precautions.                 Following commands: Intact Following commands impaired: Only follows one step commands consistently      Cueing   Cueing Techniques: Verbal cues  Exercises      Shoulder Instructions       General Comments      Pertinent Vitals/ Pain       Pain Assessment Pain Assessment: Faces Faces Pain Scale: Hurts little more Pain Location: L hip with intitial bed mobility Pain Descriptors / Indicators: Grimacing, Guarding Pain Intervention(s): Premedicated before session, Repositioned, Monitored during session, Limited activity within patient's tolerance, Patient requesting pain meds-RN notified  Home Living                                          Prior Functioning/Environment              Frequency  Min 2X/week        Progress Toward Goals  OT Goals(current goals can now be found in the care plan section)  Progress towards OT goals: Progressing toward goals     Plan      Co-evaluation                 AM-PAC OT "6 Clicks" Daily Activity     Outcome Measure   Help from another person eating meals?: None Help from another person taking care of personal grooming?: A Little Help from another person toileting, which includes using toliet, bedpan, or urinal?: A Little Help from another person bathing (including washing, rinsing, drying)?: A Little Help from another person to put on and taking off regular upper body clothing?: A Little Help from another person to put on and taking off regular lower body clothing?: A Lot 6 Click Score: 18    End of Session Equipment Utilized During Treatment: Rolling walker (2 wheels);Other (comment) (abductor pillow)  OT Visit Diagnosis: Unsteadiness on feet (R26.81);Other abnormalities of gait and mobility (R26.89);Muscle weakness (generalized) (M62.81)   Activity Tolerance Patient tolerated  treatment well   Patient Left in bed;with call bell/phone within reach;with bed alarm set   Nurse Communication Mobility status;Patient requests pain meds (reviewed he will need assistance removing abductor pillow)        Time: 0347-4259 OT Time Calculation (min): 30 min  Charges: OT General Charges $OT Visit: 1 Visit OT Treatments $Self Care/Home Management : 23-37 mins  Howell Macintosh, COTA/L Acute Rehabilitation [phone]   Leory Rands [place]-COTA/L  03/08/2024, 12:36 PM

## 2024-03-09 DIAGNOSIS — M869 Osteomyelitis, unspecified: Secondary | ICD-10-CM | POA: Diagnosis not present

## 2024-03-09 DIAGNOSIS — F1120 Opioid dependence, uncomplicated: Secondary | ICD-10-CM | POA: Diagnosis not present

## 2024-03-09 DIAGNOSIS — L03116 Cellulitis of left lower limb: Secondary | ICD-10-CM | POA: Diagnosis not present

## 2024-03-09 DIAGNOSIS — D638 Anemia in other chronic diseases classified elsewhere: Secondary | ICD-10-CM | POA: Diagnosis not present

## 2024-03-09 NOTE — Progress Notes (Signed)
 Progress Note   Patient: Mark Hammond ZOX:[id number] DOB: [birth year] DOA: 02/19/2024     18 DOS: the patient was seen and examined on 03/09/2024   Brief hospital course: 33 year old M polysubstance use/IVDU was recently discharged on 5/16 after treatment for septic left hip arthritis.  Patient was readmitted with cellulitis of the left foot ankle.  Patient underwent left hip arthroplasty for chronic septic arthritis and osteomyelitis with collapse of femoral head with failed IR drainage and outpatient antibiotic treatment.  He is presently getting treatment for foot cellulitis.   5/27: Hemodynamically stable, will be difficult to place for SNF with history of polysubstance abuse and on Medicaid.  Patient wants to go to rehab for his substance abuse.  Discussed with ID and will continue antibiotics for total of 6 weeks.  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 Query ortho team if wound vac to be discontinued.  Plan for 6 weeks antibiotic course initiating 5/23.  Initial recommendation for 2-4 weeks of IV cefazolin  with transition to p.o. upon discharge.  ID follow up next week for further guidance on antibiotic regimen as it will be more than 2 weeks of IV abx.  Patient to continue working with PT/ OT for rehab placement. TOC to work on placement once IV abx are finished.   Normocytic anemia Hemoglobin stable s/p 1 unit PRBCs on 5/27.  No active bleeding appreciated.  Follow h/h.     Left foot cellulitis Antibiotics as above.   Bipolar disorder Seroquel  on board.   Acute hypoxic respiratory failure secondary to opioid overdose Resolved.  Now on room air.      Out of bed to chair. Incentive spirometry. Nursing supportive care. Fall, aspiration precautions. Diet:  Diet Orders (From admission, onward)     Start     Ordered   02/23/24 2029  Diet  regular Room service appropriate? Yes; Fluid consistency: Thin  Diet effective now       Question Answer Comment  Room service appropriate? Yes   Fluid consistency: Thin      02/23/24 2028           DVT prophylaxis: SCDs Start: 02/23/24 2029  Level of care: Med-Surg   Code Status: Full Code  Subjective: Patient is seen and examined today morning. He is working with PT. Denies any complaints.  Physical Exam: Vitals:   03/08/24 2102 03/09/24 0409 03/09/24 0801 03/09/24 1322  BP: 106/65 (!) 96/51 102/64 105/64  Pulse: 78 70 91 83  Resp: 20 20 15 16   Temp: 98.6 F (37 C) 98.4 F (36.9 C) 98.6 F (37 C) 98.5 F (36.9 C)  TempSrc:    Oral  SpO2: 100% 99% 99% 98%  Weight:      Height:        General -  Young  Caucasian male, no apparent distress HEENT - PERRLA, EOMI, atraumatic head, non tender sinuses. Lung - Clear, no rales, rhonchi, wheezes. Heart - S1, S2 heard, no murmurs, rubs, no pedal edema. Abdomen - Soft, non tender, bowel sounds good Neuro - Alert, awake and oriented x 3, non focal exam. Skin - Warm and dry. Left hip surgical staples clean noted.  Data Reviewed:      Latest Ref Rng & Units 03/06/2024    5:43 AM 03/02/2024    4:24 AM 03/01/2024    8:13 AM  CBC  WBC 4.0 - 10.5  K/uL 4.7  8.1  5.9   Hemoglobin 13.0 - 17.0 g/dL 9.2  9.8  9.2   Hematocrit 39.0 - 52.0 % 28.7  30.1  28.3   Platelets 150 - 400 K/uL 311  391  362       Latest Ref Rng & Units 03/06/2024    5:43 AM 03/02/2024    4:24 AM 03/01/2024    8:13 AM  BMP  Glucose 70 - 99 mg/dL 865  784  696   BUN 6 - 20 mg/dL 7  8  11    Creatinine 0.61 - 1.24 mg/dL 2.95  2.84  1.32   Sodium 135 - 145 mmol/L 135  135  136   Potassium 3.5 - 5.1 mmol/L 3.8  3.6  3.3   Chloride 98 - 111 mmol/L 99  100  99   CO2 22 - 32 mmol/L 30  26  27    Calcium 8.9 - 10.3 mg/dL 9.5  9.0  9.3    No results found.  Family Communication: Discussed with patient, he understand and agree. All questions  answered.  Disposition: Status is: Inpatient Remains inpatient appropriate because: IV antibiotics, placement  Planned Discharge Destination: Rehab     Time spent: 38 minutes  Author: Aisha Hove, MD 03/09/2024 3:54 PM Secure chat 7am to 7pm For on call review [email or web address].

## 2024-03-10 DIAGNOSIS — D638 Anemia in other chronic diseases classified elsewhere: Secondary | ICD-10-CM | POA: Diagnosis not present

## 2024-03-10 DIAGNOSIS — L03116 Cellulitis of left lower limb: Secondary | ICD-10-CM | POA: Diagnosis not present

## 2024-03-10 DIAGNOSIS — F1120 Opioid dependence, uncomplicated: Secondary | ICD-10-CM | POA: Diagnosis not present

## 2024-03-10 DIAGNOSIS — M869 Osteomyelitis, unspecified: Secondary | ICD-10-CM | POA: Diagnosis not present

## 2024-03-10 MED ORDER — HYDROCODONE-ACETAMINOPHEN 7.5-325 MG PO TABS
1.0000 | ORAL_TABLET | ORAL | Status: DC | PRN
  Administered 2024-03-10 – 2024-03-20 (×39): 1 via ORAL
  Filled 2024-03-10 (×44): qty 1

## 2024-03-10 MED ORDER — HYDROCODONE-ACETAMINOPHEN 5-325 MG PO TABS
1.0000 | ORAL_TABLET | ORAL | Status: DC | PRN
  Administered 2024-03-12 – 2024-03-13 (×3): 1 via ORAL
  Filled 2024-03-10 (×3): qty 1

## 2024-03-10 NOTE — Plan of Care (Signed)

## 2024-03-10 NOTE — Progress Notes (Signed)
 Mobility Specialist Progress Note;   03/10/24 1056  Mobility  Activity Refused mobility  Mobility Specialist Start Time (ACUTE ONLY) 1056   Pt refusing mobility at this time d/t pain, although receiving pain meds ~20 min ago according to pt. Pt left in bed with all needs met.   Janit Meline Mobility Specialist Please contact via SecureChat or [employer] [phone]

## 2024-03-10 NOTE — Progress Notes (Incomplete)
 Mobility Specialist Progress Note;  Pre-mobility:  During-mobility: Post-mobility:  [employer] fast streamer Please contact via Special educational needs teacher or [employer] [phone]

## 2024-03-10 NOTE — Progress Notes (Signed)
 Progress Note   Patient: Mark Hammond YNW:[id number] DOB: 10-02-1991 DOA: 02/19/2024     19 DOS: the patient was seen and examined on 03/10/2024   Brief hospital course: 33 year old M polysubstance use/IVDU was recently discharged on 5/16 after treatment for septic left hip arthritis.  Patient was readmitted with cellulitis of the left foot ankle.  Patient underwent left hip arthroplasty for chronic septic arthritis and osteomyelitis with collapse of femoral head with failed IR drainage and outpatient antibiotic treatment.  He is presently getting treatment for foot cellulitis.   5/27: Hemodynamically stable, will be difficult to place for SNF with history of polysubstance abuse and on Medicaid.  Patient wants to go to rehab for his substance abuse.  Discussed with ID and will continue antibiotics for total of 6 weeks.  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 Query ortho team if wound vac to be discontinued.  Plan for 6 weeks antibiotic course initiating 5/23.  Initial recommendation for 2-4 weeks of IV cefazolin  with transition to p.o. upon discharge.  ID follow up next week for further guidance on antibiotic regimen as it will be more than 2 weeks of IV abx.  Decreased his pain meds to Norco 1 tab PRN, morphine  IV stopped. Patient to continue working with PT/ OT for rehab placement. TOC to work on placement once IV abx are finished.   Normocytic anemia Hemoglobin stable s/p 1 unit PRBCs on 5/27.  No active bleeding appreciated.  Follow h/h.     Left foot cellulitis Antibiotics as above.   Bipolar disorder Continue Seroquel .   Acute hypoxic respiratory failure secondary to opioid overdose Resolved.  Now on room air.  Polysubstance abuse: Discontinue CIWA protocol as he ihas been inpatient more than 20 days. Advised to quit illicit  drugs.       Out of bed to chair. Incentive spirometry. Nursing supportive care. Fall, aspiration precautions. Diet:  Diet Orders (From admission, onward)     Start     Ordered   02/23/24 2029  Diet regular Room service appropriate? Yes; Fluid consistency: Thin  Diet effective now       Question Answer Comment  Room service appropriate? Yes   Fluid consistency: Thin      02/23/24 2028           DVT prophylaxis: SCDs Start: 02/23/24 2029  Level of care: Med-Surg   Code Status: Full Code  Subjective: Patient is seen and examined today morning. He states he walked 500 ft today with PT. Denies any complaints. Advised to staop using IV pain meds.  Physical Exam: Vitals:   03/09/24 1634 03/09/24 1953 03/10/24 0533 03/10/24 0800  BP: (!) 107/59 111/60 100/61 (!) 108/51  Pulse: 62 83 83 70  Resp: 16 16 20 18   Temp: 98.7 F (37.1 C) 98.4 F (36.9 C) 98.8 F (37.1 C) 98.8 F (37.1 C)  TempSrc:  Oral Oral Oral  SpO2: 100% 100% 99% 100%  Weight:      Height:        General -  Young  Caucasian male, no apparent distress HEENT - PERRLA, EOMI, atraumatic head, non tender sinuses. Lung - Clear, no rales, rhonchi, wheezes. Heart - S1, S2 heard, no murmurs, rubs, no pedal edema. Abdomen - Soft, non tender, bowel sounds good Neuro - Alert, awake and oriented x 3, non focal exam. Skin - Warm and dry.  Left hip surgical staples clean noted.  Data Reviewed:      Latest Ref Rng & Units 03/06/2024    5:43 AM 03/02/2024    4:24 AM 03/01/2024    8:13 AM  CBC  WBC 4.0 - 10.5 K/uL 4.7  8.1  5.9   Hemoglobin 13.0 - 17.0 g/dL 9.2  9.8  9.2   Hematocrit 39.0 - 52.0 % 28.7  30.1  28.3   Platelets 150 - 400 K/uL 311  391  362       Latest Ref Rng & Units 03/06/2024    5:43 AM 03/02/2024    4:24 AM 03/01/2024    8:13 AM  BMP  Glucose 70 - 99 mg/dL 409  811  914   BUN 6 - 20 mg/dL 7  8  11    Creatinine 0.61 - 1.24 mg/dL 7.82  9.56  2.13   Sodium 135 - 145 mmol/L 135  135  136    Potassium 3.5 - 5.1 mmol/L 3.8  3.6  3.3   Chloride 98 - 111 mmol/L 99  100  99   CO2 22 - 32 mmol/L 30  26  27    Calcium 8.9 - 10.3 mg/dL 9.5  9.0  9.3    No results found.  Family Communication: Discussed with patient, he understand and agree. All questions answered.  Disposition: Status is: Inpatient Remains inpatient appropriate because: IV antibiotics, placement  Planned Discharge Destination: Rehab     Time spent: 37 minutes  Author: Aisha Hove, MD 03/10/2024 11:53 AM Secure chat 7am to 7pm For on call review [email or web address].

## 2024-03-11 DIAGNOSIS — F1120 Opioid dependence, uncomplicated: Secondary | ICD-10-CM | POA: Diagnosis not present

## 2024-03-11 DIAGNOSIS — M00052 Staphylococcal arthritis, left hip: Secondary | ICD-10-CM | POA: Diagnosis not present

## 2024-03-11 DIAGNOSIS — M869 Osteomyelitis, unspecified: Secondary | ICD-10-CM | POA: Diagnosis not present

## 2024-03-11 DIAGNOSIS — L03116 Cellulitis of left lower limb: Secondary | ICD-10-CM | POA: Diagnosis not present

## 2024-03-11 DIAGNOSIS — D638 Anemia in other chronic diseases classified elsewhere: Secondary | ICD-10-CM | POA: Diagnosis not present

## 2024-03-11 DIAGNOSIS — B9561 Methicillin susceptible Staphylococcus aureus infection as the cause of diseases classified elsewhere: Secondary | ICD-10-CM

## 2024-03-11 DIAGNOSIS — M86152 Other acute osteomyelitis, left femur: Secondary | ICD-10-CM | POA: Diagnosis not present

## 2024-03-11 NOTE — Progress Notes (Signed)
 Mobility Specialist: Progress Note   03/11/24 1230  Mobility  Activity Ambulated with assistance in hallway  Level of Assistance Standby assist, set-up cues, supervision of patient - no hands on  Assistive Device Front wheel walker  Distance Ambulated (ft) 150 ft  LLE Weight Bearing Per Provider Order TWB  Activity Response Tolerated well  Mobility Referral Yes  Mobility visit 1 Mobility  Mobility Specialist Start Time (ACUTE ONLY) 1120  Mobility Specialist Stop Time (ACUTE ONLY) 1133  Mobility Specialist Time Calculation (min) (ACUTE ONLY) 13 min    Pt received in bed, agreeable to mobility session. SV throughout. C/o L hip pain and states the feels a "popping" sensation in his hip when he tries to sit up. Able to tolerate TWB on LLE this session and maintains WB precaution well. Returned to room without fault. Left in bed with a pillow in between his knees, all needs met, call bell in reach.    Mark Hammond Mobility Specialist Please contact via SecureChat or Rehab office at [phone]

## 2024-03-11 NOTE — Progress Notes (Signed)
 Progress Note   Patient: Mark Hammond WUJ:[id number] DOB: 1991/03/21 DOA: 02/19/2024     20 DOS: the patient was seen and examined on 03/11/2024   Brief hospital course: 33 year old M polysubstance use/IVDU was recently discharged on 5/16 after treatment for septic left hip arthritis.  Patient was readmitted with cellulitis of the left foot ankle.  Patient underwent left hip arthroplasty for chronic septic arthritis and osteomyelitis with collapse of femoral head with failed IR drainage and outpatient antibiotic treatment.  He is presently getting treatment for foot cellulitis.   5/27: Hemodynamically stable, will be difficult to place for SNF with history of polysubstance abuse and on Medicaid.  Patient wants to go to rehab for his substance abuse.  Discussed with ID and will continue antibiotics for total of 6 weeks.  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 Wound vac removed by ortho. Initial recommendation for 2-4 weeks of IV cefazolin  with transition to p.o. upon discharge.   ID follow up 6/9 advised TTE for further abx recommendations.  Tapered Norco to 1 tab PRN, morphine  IV stopped. Patient to continue working with PT/ OT to go rehab for drug abuse. TOC to work on rehab once IV abx are finished.   Normocytic anemia Hemoglobin stable s/p 1 unit PRBCs on 5/27.  No active bleeding appreciated.  Follow h/h.     Left foot cellulitis Antibiotics as above.   Bipolar disorder Continue Seroquel .   Acute hypoxic respiratory failure secondary to opioid overdose Resolved.  Now on room air.  Polysubstance abuse: Discontinued CIWA protocol as he has been inpatient more than 20 days. Advised to quit illicit drugs. Rehab upon dc.       Out of bed to chair. Incentive spirometry. Nursing supportive care. Fall, aspiration precautions. Diet:  Diet Orders  (From admission, onward)     Start     Ordered   02/23/24 2029  Diet regular Room service appropriate? Yes; Fluid consistency: Thin  Diet effective now       Question Answer Comment  Room service appropriate? Yes   Fluid consistency: Thin      02/23/24 2028           DVT prophylaxis: SCDs Start: 02/23/24 2029  Level of care: Med-Surg   Code Status: Full Code  Subjective: Patient is seen and examined today morning. He walked 150 feet with PT. Denies any complaints.   Physical Exam: Vitals:   03/10/24 1705 03/10/24 2028 03/11/24 0816 03/11/24 1551  BP: 105/67 114/60 (!) 107/57 104/61  Pulse: 86 87 93 72  Resp: 19 20 20 19   Temp: 98.8 F (37.1 C) 99 F (37.2 C) 98 F (36.7 C) 98 F (36.7 C)  TempSrc: Oral  Oral Oral  SpO2: 99% 100% 100% 100%  Weight:      Height:        General -  Young  Caucasian male, no apparent distress HEENT - PERRLA, EOMI, atraumatic head, non tender sinuses. Lung - Clear, no rales, rhonchi, wheezes. Heart - S1, S2 heard, no murmurs, rubs, no pedal edema. Abdomen - Soft, non tender, bowel sounds good Neuro - Alert, awake and oriented x 3, non focal exam. Skin - Warm and dry. Left hip surgical staples clean noted.  Data Reviewed:      Latest Ref Rng & Units 03/06/2024    5:43 AM 03/02/2024    4:24 AM 03/01/2024  8:13 AM  CBC  WBC 4.0 - 10.5 K/uL 4.7  8.1  5.9   Hemoglobin 13.0 - 17.0 g/dL 9.2  9.8  9.2   Hematocrit 39.0 - 52.0 % 28.7  30.1  28.3   Platelets 150 - 400 K/uL 311  391  362       Latest Ref Rng & Units 03/06/2024    5:43 AM 03/02/2024    4:24 AM 03/01/2024    8:13 AM  BMP  Glucose 70 - 99 mg/dL 161  096  045   BUN 6 - 20 mg/dL 7  8  11    Creatinine 0.61 - 1.24 mg/dL 4.09  8.11  9.14   Sodium 135 - 145 mmol/L 135  135  136   Potassium 3.5 - 5.1 mmol/L 3.8  3.6  3.3   Chloride 98 - 111 mmol/L 99  100  99   CO2 22 - 32 mmol/L 30  26  27    Calcium 8.9 - 10.3 mg/dL 9.5  9.0  9.3    No results found.  Family  Communication: Discussed with patient, he understand and agree. All questions answered.  Disposition: Status is: Inpatient Remains inpatient appropriate because: IV antibiotics, safe dc plan  Planned Discharge Destination: Rehab     Time spent: 36 minutes  Author: Aisha Hove, MD 03/11/2024 4:00 PM Secure chat 7am to 7pm For on call review [email or web address].

## 2024-03-11 NOTE — Progress Notes (Signed)
 Physical Therapy Treatment Patient Details Name: Mark Hammond MRN: [id number] DOB: [birth year] Today's Date: 03/11/2024   History of Present Illness 33 y/o male adm 02/19/24 with ongoing LLE pain with MSSA, cellulitis, osteomyelitis and septic arthritis. 5/23 resection of left femoral head, placement of antibiotic hip spacer (posterior approach) with I&D and VAC. PMhx: 4/30-5/16 for osteomyelitis and cellulitis of LLE w/ hip aspiration. ED visit 5/19 for OD. Alcoholic gastritis, BPD, Depression, drug abuse, ETOH abuse, hallucination, schizo affective schizophrenia.    PT Comments  Pt received in sidelying with pillow between his knees, pt eager to participate in therapy session. Pt needing up to Supervision for cues for posterior hip precautions and demonstrates improved ability to tolerate LLE TWB this date with RW support. Pt reports pain is increased this date due to change in pain meds but appears to be mobilizing well. Pt continues to benefit from PT services to progress toward functional mobility goals.    If plan is discharge home, recommend the following: Assist for transportation;Assistance with cooking/housework   Can travel by private Scientist, research (medical) walker (2 wheels);Wheelchair (measurements PT);Wheelchair cushion (measurements PT)    Recommendations for Other Services       Precautions / Restrictions Precautions Precautions: Fall;Posterior Hip Precaution Booklet Issued: Yes (comment) Recall of Precautions/Restrictions: Impaired Precaution/Restrictions Comments: with bed mobility pt tending to break 1/3 precs Other Brace: hip abd pillow in room if pt able to tolerate supine position Restrictions Weight Bearing Restrictions Per Provider Order: Yes LLE Weight Bearing Per Provider Order: Touchdown weight bearing     Mobility  Bed Mobility Overal bed mobility: Needs Assistance Bed Mobility: Rolling, Sidelying to Sit Rolling: Modified  independent (Device/Increase time) Sidelying to sit: Supervision       General bed mobility comments: from flat bed, pt able to verbalize precs but needs x1 reminder due to increased L hip IR while log rolling to R EOB despite pillow between his legs    Transfers Overall transfer level: Modified independent Equipment used: Rolling walker (2 wheels) Transfers: Sit to/from Stand Sit to Stand: Modified independent (Device/Increase time)           General transfer comment: EOB<>RW, no assist needed    Ambulation/Gait Ambulation/Gait assistance: Supervision Gait Distance (Feet): 160 Feet Assistive device: Rolling walker (2 wheels) Gait Pattern/deviations: Knee flexed in stance - left Gait velocity: fair cadence for task Gait velocity interpretation: <1.8 ft/sec, indicate of risk for recurrent falls   General Gait Details: Pt able to perform LLE TDWB throughout trial this date, c/o constant L hip pain but no other acute s/sx distress and seems very consistent with maintaining L TDWB today.   Stairs             Wheelchair Mobility     Tilt Bed    Modified Rankin (Stroke Patients Only)       Balance Overall balance assessment: Needs assistance Sitting-balance support: No upper extremity supported, Feet supported Sitting balance-Leahy Scale: Good     Standing balance support: During functional activity, Reliant on assistive device for balance Standing balance-Leahy Scale: Poor Standing balance comment: Poor due to L TWB status; Good with RW, poor without                            Communication Communication Communication: No apparent difficulties  Cognition Arousal: Alert Behavior During Therapy: WFL for tasks assessed/performed   PT - Cognitive  impairments: No apparent impairments                         Following commands: Intact      Cueing Cueing Techniques: Verbal cues  Exercises Other Exercises Other Exercises: VCs for  supine vs supported sidelying (pillow between legs) knee extension and heel cord stretch, pt reports he has been working on this. PTA encouraged him to work on supine position and knee extension as much as possible to prevent L knee contracture    General Comments        Pertinent Vitals/Pain Pain Assessment Pain Assessment: Faces Faces Pain Scale: Hurts even more Pain Location: L hip with TDWB and at rest; pt reports pain is increased today since MD decreased his pain meds in the past day or two Pain Descriptors / Indicators: Grimacing, Guarding, Throbbing, Stabbing, Sharp, Dull Pain Intervention(s): Limited activity within patient's tolerance, Monitored during session, Repositioned    Home Living                          Prior Function            PT Goals (current goals can now be found in the care plan section) Acute Rehab PT Goals Patient Stated Goal: decr pain and to be more independent so I can go to substance abuse rehab PT Goal Formulation: With patient Time For Goal Achievement: 03/10/24 Progress towards PT goals: Progressing toward goals    Frequency    Min 2X/week      PT Plan      Co-evaluation              AM-PAC PT "6 Clicks" Mobility   Outcome Measure  Help needed turning from your back to your side while in a flat bed without using bedrails?: None Help needed moving from lying on your back to sitting on the side of a flat bed without using bedrails?: A Little Help needed moving to and from a bed to a chair (including a wheelchair)?: None Help needed standing up from a chair using your arms (e.g., wheelchair or bedside chair)?: None Help needed to walk in hospital room?: A Little Help needed climbing 3-5 steps with a railing? : A Little 6 Click Score: 21    End of Session Equipment Utilized During Treatment: Gait belt Activity Tolerance: Patient tolerated treatment well Patient left: in bed;with call bell/phone within reach Nurse  Communication: Mobility status PT Visit Diagnosis: Other abnormalities of gait and mobility (R26.89);Pain Pain - Right/Left: Left Pain - part of body: Hip     Time: 2956-2130 PT Time Calculation (min) (ACUTE ONLY): 15 min  Charges:    $Therapeutic Activity: 8-22 mins PT General Charges $$ ACUTE PT VISIT: 1 Visit                     Elasia Furnish P., PTA Acute Rehabilitation Services Secure Chat Preferred 9a-5:30pm Office: [phone]    Mariel Shope [facility] 03/11/2024, 5:05 PM

## 2024-03-11 NOTE — Progress Notes (Signed)
 [facility]  Date of Admission:  02/19/2024      Total days of antibiotics 20   Cefazolin     ASSESSMENT: Mark Hammond is a 33 y.o. male admitted with:   H/O MSSA Chronic Osteomyelitis of Left hip acetabulum and femoral head c/b Abscess -  S/P Drainage 5/5 with IR -  S/P Resection of Left Femoral Head, ABX Spacer 5/23 -  Cellulitis Left Ankle -  CRP has normalized, ESR mildly elevated at 44 in the setting of chronic anemia that may influence. Afebrile, normal white blood cell count now 17 days post op from hip surgery. Refused PT today with changes in pain medications but knows he needs to work through to bridge to rehab. Surgical incision is healing well on left hip.  Will repeat surface echo to ensure no changes concerning for AV infection before final recs.  Encouraged him to walk and participate in PT so we can help him be more successful at discharge.   ?Heart Murmur -  TTE on 5/5 with thickened AV and possible calcification. He was never bacteremic in review of his previous blood cultures from 4/30, 5/1 and 5/8.  Repeat surface echo to follow for any changes as we make decisions about IV antibiotic duration.   Chronic Hepatitis C -  Has positive quantitative RNA level indicating chronic infection.  Outpatient management after acute problems resolve   Disposition -  He has plans to go to rehab after this however he feels they won't take him if he cannot walk.    PLAN: Continue cefazolin   Repeat TTE with AV thickening and ?murmur.    Principal Problem:   Osteomyelitis of left hip (HCC) Active Problems:   Polysubstance (including opioids) dependence, daily use (HCC)   Cellulitis of left lower extremity   Anemia of chronic disease   Transaminitis   Cellulitis    aspirin  EC  81 mg Oral BID   docusate sodium   100 mg Oral BID   folic acid   1 mg Oral Daily   magnesium  oxide  400 mg Oral BID   multivitamin with minerals  1 tablet  Oral Daily   nicotine   14 mg Transdermal Daily   nystatin    Topical TID   QUEtiapine   50 mg Oral QHS   thiamine   100 mg Oral Daily    SUBJECTIVE: Trying to get used to new pain regimen for hip management. Going to try to get used to it.  Pain is much better since surgery. No fevers/chills. No N/V/D, no itching.  He has plans to go to drug rehab after this.   Review of Systems: Review of Systems  Constitutional:  Negative for chills and fever.  Cardiovascular:  Negative for chest pain.  Musculoskeletal:  Positive for joint pain.    Allergies  Allergen Reactions   Codeine Other (See Comments)    Patient states parents told him he was allergic at a young age.   Fish Allergy Other (See Comments)    Patient does not wish to eat ANY fish- reaction unclear   Shellfish Allergy Other (See Comments)    Patient does not wish to eat ANY shellfish- reaction unclear    OBJECTIVE: Vitals:   03/10/24 0800 03/10/24 1705 03/10/24 2028 03/11/24 0816  BP: (!) 108/51 105/67 114/60 (!) 107/57  Pulse: 70 86 87 93  Resp: 18 19 20 20   Temp: 98.8 F (37.1 C) 98.8 F (37.1 C) 99 F (37.2  C) (!) 93 F (33.9 C)  TempSrc: Oral Oral  Oral  SpO2: 100% 99% 100% 100%  Weight:      Height:       Body mass index is 23.03 kg/m.  Physical Exam Vitals reviewed.  Constitutional:      Appearance: Normal appearance. He is not ill-appearing.  HENT:     Head: Normocephalic.     Mouth/Throat:     Mouth: Mucous membranes are moist.     Pharynx: Oropharynx is clear.  Eyes:     General: No scleral icterus. Cardiovascular:     Rate and Rhythm: Normal rate.     Heart sounds: Murmur heard.  Pulmonary:     Effort: Pulmonary effort is normal.  Musculoskeletal:        General: Normal range of motion.     Cervical back: Normal range of motion.     Comments: Left hip with healing incision   Skin:    Coloration: Skin is not jaundiced or pale.  Neurological:     Mental Status: He is alert and oriented  to person, place, and time.  Psychiatric:        Mood and Affect: Mood normal.        Judgment: Judgment normal.     Lab Results Lab Results  Component Value Date   WBC 4.7 03/06/2024   HGB 9.2 (L) 03/06/2024   HCT 28.7 (L) 03/06/2024   MCV 92.9 03/06/2024   PLT 311 03/06/2024    Lab Results  Component Value Date   CREATININE 0.49 (L) 03/06/2024   BUN 7 03/06/2024   NA 135 03/06/2024   K 3.8 03/06/2024   CL 99 03/06/2024   CO2 30 03/06/2024    Lab Results  Component Value Date   ALT 18 03/06/2024   AST 45 (H) 03/06/2024   ALKPHOS 121 03/06/2024   BILITOT 0.6 03/06/2024     Microbiology: No results found for this or any previous visit (from the past 240 hours).   Gibson Kurtz, MSN, NP-C [facility]  [email or web address] Pager: [phone] Office: [phone] [facility] Main Line: [phone] *Secure Chat Communication Welcome

## 2024-03-11 NOTE — Progress Notes (Signed)
 Assume care at 0700, patient lying bed with eyes open, even unlabored respiration, skin warm and dry to the touch, call light within reach safety measures are in place.

## 2024-03-12 ENCOUNTER — Inpatient Hospital Stay (HOSPITAL_COMMUNITY): Payer: MEDICAID

## 2024-03-12 DIAGNOSIS — L03116 Cellulitis of left lower limb: Secondary | ICD-10-CM | POA: Diagnosis not present

## 2024-03-12 DIAGNOSIS — R011 Cardiac murmur, unspecified: Secondary | ICD-10-CM | POA: Diagnosis not present

## 2024-03-12 DIAGNOSIS — M86152 Other acute osteomyelitis, left femur: Secondary | ICD-10-CM | POA: Diagnosis not present

## 2024-03-12 DIAGNOSIS — B9561 Methicillin susceptible Staphylococcus aureus infection as the cause of diseases classified elsewhere: Secondary | ICD-10-CM | POA: Diagnosis not present

## 2024-03-12 DIAGNOSIS — M869 Osteomyelitis, unspecified: Secondary | ICD-10-CM | POA: Diagnosis not present

## 2024-03-12 DIAGNOSIS — M00052 Staphylococcal arthritis, left hip: Secondary | ICD-10-CM | POA: Diagnosis not present

## 2024-03-12 DIAGNOSIS — D638 Anemia in other chronic diseases classified elsewhere: Secondary | ICD-10-CM | POA: Diagnosis not present

## 2024-03-12 DIAGNOSIS — F1120 Opioid dependence, uncomplicated: Secondary | ICD-10-CM | POA: Diagnosis not present

## 2024-03-12 LAB — ECHOCARDIOGRAM COMPLETE
AR max vel: 2.07 cm2
AV Area VTI: 2.16 cm2
AV Area mean vel: 1.83 cm2
AV Mean grad: 4 mmHg
AV Peak grad: 6.2 mmHg
Ao pk vel: 1.24 m/s
Area-P 1/2: 3.56 cm2
Height: 63 in
S' Lateral: 3.2 cm
Weight: 2080 [oz_av]

## 2024-03-12 NOTE — Progress Notes (Signed)
 Occupational Therapy Treatment Patient Details Name: Mark Hammond MRN: [id number] DOB: 05/02/1991 Today's Date: 03/12/2024   History of present illness 33 y/o male adm 02/19/24 with ongoing LLE pain with MSSA, cellulitis, osteomyelitis and septic arthritis. 5/23 resection of left femoral head, placement of antibiotic hip spacer (posterior approach) with I&D and VAC. PMhx: 4/30-5/16 for osteomyelitis and cellulitis of LLE w/ hip aspiration. ED visit 5/19 for OD. Alcoholic gastritis, BPD, Depression, drug abuse, ETOH abuse, hallucination, schizo affective schizophrenia.   OT comments  Pt progressing toward goals, goals updated. Pt frustrated with potential d/c date, agreeable to hallway mobility and declines ADLs at this time. Attempted to engage pt in discussion for goal update, pt states he needs help with "everything", thorugh set up-CGA for ADL per prior notes. Pt currently performing standing transfers with supervision using RW and bed mobility independently. Pt was able to shower earlier this week, reported difficulty with reaching down toward feet, administered long handle sponge and demo'd use. Pt presenting with impairments listed below, will follow acutely. Continue to anticipate no OT follow up needs at d/c.       If plan is discharge home, recommend the following:  Assistance with cooking/housework;Direct supervision/assist for medications management;Assist for transportation;Help with stairs or ramp for entrance;A little help with bathing/dressing/bathroom   Equipment Recommendations  BSC/3in1;Other (comment);Tub/shower seat (RW)    Recommendations for Other Services PT consult    Precautions / Restrictions Precautions Precautions: Fall;Posterior Hip Precaution Booklet Issued: Yes (comment) Recall of Precautions/Restrictions: Impaired Precaution/Restrictions Comments: with bed mobility pt tending to break 1/3 precs Required Braces or Orthoses: Other Brace Other Brace: hip abd  pillow in room if pt able to tolerate supine position Restrictions Weight Bearing Restrictions Per Provider Order: Yes LLE Weight Bearing Per Provider Order: Touchdown weight bearing       Mobility Bed Mobility Overal bed mobility: Independent                  Transfers   Equipment used: Rolling walker (2 wheels) Transfers: Sit to/from Stand Sit to Stand: Supervision                 Balance Overall balance assessment: Needs assistance Sitting-balance support: No upper extremity supported, Feet supported Sitting balance-Leahy Scale: Good Sitting balance - Comments: prefers UE support due to L hip pain   Standing balance support: During functional activity, Reliant on assistive device for balance Standing balance-Leahy Scale: Poor Standing balance comment: Poor due to L TWB status; Good with RW, poor without                           ADL either performed or assessed with clinical judgement   ADL                 Lower Body Bathing Details (indicate cue type and reason): pt reports bathing with [facility] staff earlier this week, had to have assist for BLEs, administered long handle sponge for bathing         Toilet Transfer: Supervision/safety;Ambulation;Rolling walker (2 wheels) Toilet Transfer Details (indicate cue type and reason): simulated via funcitonal mobility         Functional mobility during ADLs: Supervision/safety      Extremity/Trunk Assessment Upper Extremity Assessment Upper Extremity Assessment: Overall WFL for tasks assessed   Lower Extremity Assessment Lower Extremity Assessment: Defer to PT evaluation        Vision   Vision Assessment?: No apparent visual deficits  Perception Perception Perception: Not tested   Praxis Praxis Praxis: Not tested   Communication Communication Communication: No apparent difficulties   Cognition Arousal: Alert Behavior During Therapy: WFL for tasks assessed/performed,  Agitated Cognition: No apparent impairments             OT - Cognition Comments: incr frustration due to d/c date potentially next week                 Following commands: Intact        Cueing   Cueing Techniques: Verbal cues  Exercises      Shoulder Instructions       General Comments pt frustrated with potential d/c next week, limited participation/communication    Pertinent Vitals/ Pain       Pain Assessment Pain Assessment: Faces Pain Score: 7  Faces Pain Scale: Hurts even more Pain Location: L hip with TDWB and at rest; pt reports pain is increased today since MD decreased his pain meds in the past day or two Pain Descriptors / Indicators: Grimacing, Guarding, Throbbing, Stabbing, Sharp, Dull Pain Intervention(s): Limited activity within patient's tolerance, Monitored during session, Repositioned  Home Living                                          Prior Functioning/Environment              Frequency  Min 2X/week        Progress Toward Goals  OT Goals(current goals can now be found in the care plan section)  Progress towards OT goals: Progressing toward goals  Acute Rehab OT Goals Patient Stated Goal: none stated OT Goal Formulation: With patient Time For Goal Achievement: 03/09/24 Potential to Achieve Goals: Good ADL Goals Pt Will Perform Grooming: Independently;standing;sitting Pt Will Perform Upper Body Bathing: Independently;sitting Pt Will Perform Lower Body Bathing: Independently;with adaptive equipment;sitting/lateral leans Pt Will Perform Upper Body Dressing: Independently;sitting Pt Will Perform Lower Body Dressing: Independently;sitting/lateral leans;with adaptive equipment Pt Will Transfer to Toilet: Independently;ambulating;bedside commode;regular height toilet Pt Will Perform Tub/Shower Transfer: Tub transfer;Shower transfer;Independently;ambulating;3 in 1;rolling walker;shower seat Additional ADL Goal #1:  Pt to manage ADLs/mobility > 10 min standing without seated rest break  Plan      Co-evaluation                 AM-PAC OT "6 Clicks" Daily Activity     Outcome Measure   Help from another person eating meals?: None Help from another person taking care of personal grooming?: A Little Help from another person toileting, which includes using toliet, bedpan, or urinal?: A Little Help from another person bathing (including washing, rinsing, drying)?: A Little Help from another person to put on and taking off regular upper body clothing?: A Little Help from another person to put on and taking off regular lower body clothing?: A Little 6 Click Score: 19    End of Session Equipment Utilized During Treatment: Rolling walker (2 wheels)  OT Visit Diagnosis: Unsteadiness on feet (R26.81);Other abnormalities of gait and mobility (R26.89);Muscle weakness (generalized) (M62.81)   Activity Tolerance Patient tolerated treatment well   Patient Left in bed;with call bell/phone within reach   Nurse Communication Mobility status        Time: 4098-1191 OT Time Calculation (min): 14 min  Charges: OT General Charges $OT Visit: 1 Visit OT Treatments $Therapeutic Activity: 8-22 mins  Lennell Shanks K,  OTD, OTR/L SecureChat Preferred Acute Rehab [phone]   Benedict Brain Koonce 03/12/2024, 4:21 PM

## 2024-03-12 NOTE — Progress Notes (Signed)
 [facility]  Date of Admission:  02/19/2024      Total days of antibiotics 21   Cefazolin     ASSESSMENT: Mark Hammond is a 33 y.o. male admitted with:   H/O MSSA Chronic Osteomyelitis of Left hip acetabulum and femoral head c/b Abscess -  S/P Drainage 5/5 with IR -  S/P Resection of Left Femoral Head, ABX Spacer 5/23 -  Cellulitis Left Ankle -  CRP has normalized, ESR mildly elevated at 44 in the setting of chronic anemia that may influence. Afebrile, normal white blood cell count now 18 days post op from hip surgery.  Will treat 4 weeks through 6/18 then plan to switch to oral therapy. Will need to stay inpatient for supervised IV therapy.  Encouraged him to walk and participate in PT so we can help him be more successful at discharge.   ?Heart Murmur -  TTE on 5/5 with thickened AV and possible calcification. He was never bacteremic in review of his previous blood cultures from 4/30, 5/1 and 5/8.  FU repeated surface echo for changes.   Chronic Hepatitis C -  Has positive quantitative RNA level indicating chronic infection.  Outpatient management after acute problems resolve   Disposition -  He has plans to go to rehab after this however he feels they won't take him if he cannot walk.    PLAN: Continue cefazolin  through 6/18  THEN plan to convert to cefadroxil  BID  FU TTE    Principal Problem:   Osteomyelitis of left hip (HCC) Active Problems:   Polysubstance (including opioids) dependence, daily use (HCC)   Cellulitis of left lower extremity   Anemia of chronic disease   Transaminitis   Cellulitis    aspirin  EC  81 mg Oral BID   docusate sodium   100 mg Oral BID   folic acid   1 mg Oral Daily   magnesium  oxide  400 mg Oral BID   multivitamin with minerals  1 tablet Oral Daily   nicotine   14 mg Transdermal Daily   nystatin    Topical TID   QUEtiapine   50 mg Oral QHS   thiamine   100 mg Oral Daily    SUBJECTIVE: No changes from  yesterday. Trying to deal with the pain.   Review of Systems: Review of Systems  Constitutional:  Negative for chills and fever.  Cardiovascular:  Negative for chest pain.  Musculoskeletal:  Positive for joint pain.    Allergies  Allergen Reactions   Codeine Other (See Comments)    Patient states parents told him he was allergic at a young age.   Fish Allergy Other (See Comments)    Patient does not wish to eat ANY fish- reaction unclear   Shellfish Allergy Other (See Comments)    Patient does not wish to eat ANY shellfish- reaction unclear    OBJECTIVE: Vitals:   03/12/24 0035 03/12/24 0333 03/12/24 0600 03/12/24 0746  BP: 102/60 104/63 104/69 108/63  Pulse: 72 79 79 84  Resp: 16 18    Temp:  98 F (36.7 C)  98.4 F (36.9 C)  TempSrc:      SpO2: 100% 99%  93%  Weight:      Height:       Body mass index is 23.03 kg/m.  Physical Exam Vitals reviewed.  Constitutional:      Appearance: Normal appearance. He is not ill-appearing.  HENT:     Head: Normocephalic.     Mouth/Throat:  Mouth: Mucous membranes are moist.     Pharynx: Oropharynx is clear.  Eyes:     General: No scleral icterus. Cardiovascular:     Rate and Rhythm: Normal rate.     Heart sounds: Murmur heard.  Pulmonary:     Effort: Pulmonary effort is normal.  Musculoskeletal:        General: Normal range of motion.     Cervical back: Normal range of motion.     Comments: Left hip with healing incision   Skin:    Coloration: Skin is not jaundiced or pale.  Neurological:     Mental Status: He is alert and oriented to person, place, and time.  Psychiatric:        Mood and Affect: Mood normal.        Judgment: Judgment normal.     Lab Results Lab Results  Component Value Date   WBC 4.7 03/06/2024   HGB 9.2 (L) 03/06/2024   HCT 28.7 (L) 03/06/2024   MCV 92.9 03/06/2024   PLT 311 03/06/2024    Lab Results  Component Value Date   CREATININE 0.49 (L) 03/06/2024   BUN 7 03/06/2024   NA  135 03/06/2024   K 3.8 03/06/2024   CL 99 03/06/2024   CO2 30 03/06/2024    Lab Results  Component Value Date   ALT 18 03/06/2024   AST 45 (H) 03/06/2024   ALKPHOS 121 03/06/2024   BILITOT 0.6 03/06/2024     Microbiology: No results found for this or any previous visit (from the past 240 hours).   Gibson Kurtz, MSN, NP-C [facility]  [email or web address] Pager: [phone] Office: [phone] [facility] Main Line: [phone] *Secure Chat Communication Welcome

## 2024-03-12 NOTE — Plan of Care (Signed)

## 2024-03-12 NOTE — Progress Notes (Signed)
 Progress Note   Patient: Mark Hammond ZOX:[id number] DOB: 04/14/1991 DOA: 02/19/2024     21 DOS: the patient was seen and examined on 03/12/2024   Brief hospital course: 33 year old M polysubstance use/IVDU was recently discharged on 5/16 after treatment for septic left hip arthritis.  Patient was readmitted with cellulitis of the left foot ankle.  Patient underwent left hip arthroplasty for chronic septic arthritis and osteomyelitis with collapse of femoral head with failed IR drainage and outpatient antibiotic treatment.  He is presently getting treatment for foot cellulitis.   5/27: Hemodynamically stable, will be difficult to place for SNF with history of polysubstance abuse and on Medicaid.  Patient wants to go to rehab for his substance abuse.  Discussed with ID and will continue antibiotics for total of 6 weeks.  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 Wound vac removed by ortho. Initial recommendation for 2-4 weeks of IV cefazolin  with transition to p.o. upon discharge.   ID follow up 6/9 advised TTE for further long term antibiotic recommendations.  Tapered Norco to 1 tab PRN, morphine  IV stopped. Patient to continue working with PT/ OT to go rehab for drug abuse. TOC to work on rehab once IV abx are finished.   Normocytic anemia Hemoglobin stable s/p 1 unit PRBCs on 5/27.  No active bleeding appreciated.  Follow h/h.     Left foot cellulitis Antibiotics as above.   Bipolar disorder Continue Seroquel .   Acute hypoxic respiratory failure secondary to opioid overdose Resolved.  Now on room air.  Polysubstance abuse: Discontinued CIWA protocol as he has been inpatient more than 20 days. Advised to quit illicit drugs. Rehab upon dc.       Out of bed to chair. Incentive spirometry. Nursing supportive care. Fall, aspiration  precautions. Diet:  Diet Orders (From admission, onward)     Start     Ordered   02/23/24 2029  Diet regular Room service appropriate? Yes; Fluid consistency: Thin  Diet effective now       Question Answer Comment  Room service appropriate? Yes   Fluid consistency: Thin      02/23/24 2028           DVT prophylaxis: SCDs Start: 02/23/24 2029  Level of care: Med-Surg   Code Status: Full Code  Subjective: Patient is seen and examined today morning. He walked 150 feet with PT. Denies any complaints.   Physical Exam: Vitals:   03/12/24 0035 03/12/24 0333 03/12/24 0600 03/12/24 0746  BP: 102/60 104/63 104/69 108/63  Pulse: 72 79 79 84  Resp: 16 18    Temp:  98 F (36.7 C)  98.4 F (36.9 C)  TempSrc:      SpO2: 100% 99%  93%  Weight:      Height:        General -  Young  Caucasian male, no apparent distress HEENT - PERRLA, EOMI, atraumatic head, non tender sinuses. Lung - Clear, no rales, rhonchi, wheezes. Heart - S1, S2 heard, no murmurs, rubs, no pedal edema. Abdomen - Soft, non tender, bowel sounds good Neuro - Alert, awake and oriented x 3, non focal exam. Skin - Warm and dry. Left hip surgical staples clean noted.  Data Reviewed:      Latest Ref Rng & Units 03/06/2024    5:43 AM 03/02/2024    4:24 AM 03/01/2024    8:13 AM  CBC  WBC 4.0 - 10.5 K/uL 4.7  8.1  5.9   Hemoglobin 13.0 - 17.0 g/dL 9.2  9.8  9.2   Hematocrit 39.0 - 52.0 % 28.7  30.1  28.3   Platelets 150 - 400 K/uL 311  391  362       Latest Ref Rng & Units 03/06/2024    5:43 AM 03/02/2024    4:24 AM 03/01/2024    8:13 AM  BMP  Glucose 70 - 99 mg/dL 161  096  045   BUN 6 - 20 mg/dL 7  8  11    Creatinine 0.61 - 1.24 mg/dL 4.09  8.11  9.14   Sodium 135 - 145 mmol/L 135  135  136   Potassium 3.5 - 5.1 mmol/L 3.8  3.6  3.3   Chloride 98 - 111 mmol/L 99  100  99   CO2 22 - 32 mmol/L 30  26  27    Calcium 8.9 - 10.3 mg/dL 9.5  9.0  9.3    ECHOCARDIOGRAM COMPLETE Result Date: 03/12/2024     ECHOCARDIOGRAM REPORT   Patient Name:   Mark Hammond Date of Exam: 03/12/2024 Medical Rec #:  [id number]   Height:       63.0 in Accession #:    [id number]  Weight:       130.0 lb Date of Birth:  Jan 05, 1991   BSA:          1.610 m Patient Age:    32 years    BP:           104/63 mmHg Patient Gender: M           HR:           94 bpm. Exam Location:  Inpatient Procedure: 2D Echo, Cardiac Doppler and Color Doppler (Both Spectral and Color            Flow Doppler were utilized during procedure). Indications:    Murmur  History:        Patient has prior history of Echocardiogram examinations, most                 recent 02/05/2024. Risk Factors:Hypertension.  Sonographer:    Astrid Blamer Referring Phys: [id number] Sharion Davidson DIXON  Sonographer Comments: Patient rolled on right side. IMPRESSIONS  1. Left ventricular ejection fraction, by estimation, is 60 to 65%. The left ventricle has normal function. The left ventricle has no regional wall motion abnormalities. Left ventricular diastolic parameters were normal.  2. Right ventricular systolic function is normal. The right ventricular size is normal.  3. The mitral valve is normal in structure. No evidence of mitral valve regurgitation. No evidence of mitral stenosis.  4. The aortic valve was not well visualized. Aortic valve regurgitation is not visualized. No aortic stenosis is present.  5. The inferior vena cava is normal in size with greater than 50% respiratory variability, suggesting right atrial pressure of 3 mmHg.  6. Cannot exclude a small PFO. Conclusion(s)/Recommendation(s): No evidence of valvular vegetations on this transthoracic echocardiogram. Consider a transesophageal echocardiogram to exclude infective endocarditis if clinically indicated. FINDINGS  Left Ventricle: No 3D or strain transmitted. Left ventricular ejection fraction, by estimation, is 60 to 65%. The left ventricle has normal function. The left ventricle has no regional wall motion abnormalities.  Strain was performed and the global longitudinal strain is indeterminate. The left ventricular internal cavity size was normal in size. There is no left ventricular hypertrophy. Left ventricular diastolic parameters  were normal. Right Ventricle: The right ventricular size is normal. No increase in right ventricular wall thickness. Right ventricular systolic function is normal. Left Atrium: Left atrial size was normal in size. Right Atrium: Right atrial size was normal in size. Pericardium: There is no evidence of pericardial effusion. Mitral Valve: The mitral valve is normal in structure. No evidence of mitral valve regurgitation. No evidence of mitral valve stenosis. Tricuspid Valve: The tricuspid valve is normal in structure. Tricuspid valve regurgitation is not demonstrated. No evidence of tricuspid stenosis. Aortic Valve: The aortic valve was not well visualized. Aortic valve regurgitation is not visualized. No aortic stenosis is present. Aortic valve mean gradient measures 4.0 mmHg. Aortic valve peak gradient measures 6.2 mmHg. Aortic valve area, by VTI measures 2.16 cm. Pulmonic Valve: The pulmonic valve was normal in structure. Pulmonic valve regurgitation is not visualized. No evidence of pulmonic stenosis. Aorta: The aortic root and ascending aorta are structurally normal, with no evidence of dilitation. Venous: The inferior vena cava is normal in size with greater than 50% respiratory variability, suggesting right atrial pressure of 3 mmHg. IAS/Shunts: Cannot exclude a small PFO. Additional Comments: 3D was performed not requiring image post processing on an independent workstation and was indeterminate.  LEFT VENTRICLE PLAX 2D LVIDd:         4.40 cm   Diastology LVIDs:         3.20 cm   LV e' medial:    10.00 cm/s LV PW:         0.60 cm   LV E/e' medial:  7.4 LV IVS:        0.70 cm   LV e' lateral:   17.80 cm/s LVOT diam:     1.70 cm   LV E/e' lateral: 4.2 LV SV:         35 LV SV Index:   22 LVOT Area:      2.27 cm  RIGHT VENTRICLE RV S prime:     9.90 cm/s TAPSE (M-mode): 1.6 cm LEFT ATRIUM           Index        RIGHT ATRIUM          Index LA Vol (A2C): 15.9 ml 9.87 ml/m   RA Area:     6.55 cm LA Vol (A4C): 30.1 ml 18.69 ml/m  RA Volume:   11.10 ml 6.89 ml/m  AORTIC VALVE AV Area (Vmax):    2.07 cm AV Area (Vmean):   1.83 cm AV Area (VTI):     2.16 cm AV Vmax:           124.00 cm/s AV Vmean:          94.700 cm/s AV VTI:            0.162 m AV Peak Grad:      6.2 mmHg AV Mean Grad:      4.0 mmHg LVOT Vmax:         113.00 cm/s LVOT Vmean:        76.500 cm/s LVOT VTI:          0.154 m LVOT/AV VTI ratio: 0.95 MITRAL VALVE MV Area (PHT): 3.56 cm    SHUNTS MV Decel Time: 213 msec    Systemic VTI:  0.15 m MV E velocity: 73.90 cm/s  Systemic Diam: 1.70 cm MV A velocity: 55.00 cm/s MV E/A ratio:  1.34 Gloriann Larger MD Electronically signed by Gloriann Larger MD Signature Date/Time: 03/12/2024/12:01:07 PM  Final     Family Communication: Discussed with patient, he understand and agree. All questions answered.  Disposition: Status is: Inpatient Remains inpatient appropriate because: IV antibiotics, safe dc plan  Planned Discharge Destination: Rehab     Time spent: 36 minutes  Author: Aisha Hove, MD 03/12/2024 1:24 PM Secure chat 7am to 7pm For on call review [email or web address].

## 2024-03-12 NOTE — Progress Notes (Signed)
 Mobility Specialist: Progress Note   03/12/24 1535  Mobility  Activity Ambulated with assistance in hallway  Level of Assistance Standby assist, set-up cues, supervision of patient - no hands on  Assistive Device Front wheel walker  Distance Ambulated (ft) 300 ft  LLE Weight Bearing Per Provider Order TWB  Activity Response Tolerated well  Mobility Referral Yes  Mobility visit 1 Mobility  Mobility Specialist Start Time (ACUTE ONLY) 1400  Mobility Specialist Stop Time (ACUTE ONLY) 1416  Mobility Specialist Time Calculation (min) (ACUTE ONLY) 16 min    Pt received in bed, agreeable to mobility session. SV throughout. C/o LLE pain rated 7-8/10. Maintaining WB and posterior hip precautions well. Returned to room without fault. Able to static stand at sink while linen was changed, slightly unsteady but otherwise no overt LOB. Left on EOB with food tray with all needs met, call bell in reach.    Deloria Fetch Mobility Specialist Please contact via SecureChat or Rehab office at [phone]

## 2024-03-12 NOTE — Progress Notes (Signed)
 [facility]    Date of Admission:  02/19/2024      ID: Mark Hammond is a 33 y.o. male with  MSSA left hip osteomyelitis  Principal Problem:   Osteomyelitis of left hip (HCC) Active Problems:   Polysubstance (including opioids) dependence, daily use (HCC)   Cellulitis of left lower extremity   Anemia of chronic disease   Transaminitis   Cellulitis    Subjective: Afebrile, slept well. Had TTE this morning  TTE did not show any vegetations  Medications:   aspirin  EC  81 mg Oral BID   docusate sodium   100 mg Oral BID   folic acid   1 mg Oral Daily   magnesium  oxide  400 mg Oral BID   multivitamin with minerals  1 tablet Oral Daily   nicotine   14 mg Transdermal Daily   nystatin    Topical TID   QUEtiapine   50 mg Oral QHS   thiamine   100 mg Oral Daily    Objective: Vital signs in last 24 hours: Temp:  [98 F (36.7 C)-98.4 F (36.9 C)] 98.4 F (36.9 C) (06/10 0746) Pulse Rate:  [72-84] 84 (06/10 0746) Resp:  [16-18] 18 (06/10 0333) BP: (102-108)/(60-69) 108/63 (06/10 0746) SpO2:  [93 %-100 %] 93 % (06/10 0746) Physical Exam  Constitutional: He is oriented to person, place, and time. He appears well-developed and well-nourished. No distress.  HENT:  Mouth/Throat: Oropharynx is clear and moist. No oropharyngeal exudate.  Cardiovascular: Normal rate, regular rhythm and normal heart sounds. Exam reveals no gallop and no friction rub.  No murmur heard.  Pulmonary/Chest: Effort normal and breath sounds normal. No respiratory distress. He has no wheezes.  Abdominal: Soft. Bowel sounds are normal. He exhibits no distension. There is no tenderness.  Lymphadenopathy:  He has no cervical adenopathy.  Neurological: He is alert and oriented to person, place, and time.  Skin: Skin is warm and dry. No rash noted. No erythema.  Psychiatric: He has a normal mood and affect. His behavior is normal.    Lab Results Lab Results  Component Value Date    ESRSEDRATE 44 (H) 03/06/2024     Microbiology: Reviewed   Staphylococcus aureus    MIC    CIPROFLOXACIN <=0.5 SENSI... Sensitive    CLINDAMYCIN <=0.25 SENS... Sensitive    ERYTHROMYCIN <=0.25 SENS... Sensitive    GENTAMICIN <=0.5 SENSI... Sensitive    Inducible Clindamycin NEGATIVE Sensitive    LINEZOLID 2 SENSITIVE Sensitive    OXACILLIN 0.5 SENSITIVE Sensitive    RIFAMPIN <=0.5 SENSI... Sensitive    TETRACYCLINE <=1 SENSITIVE Sensitive    TRIMETH/SULFA <=10 SENSIT... Sensitive    VANCOMYCIN  1 SENSITIVE Sensitive    Studies/Results: ECHOCARDIOGRAM COMPLETE Result Date: 03/12/2024    ECHOCARDIOGRAM REPORT   Patient Name:   Mark Hammond Date of Exam: 03/12/2024 Medical Rec #:  [id number]   Height:       63.0 in Accession #:    [id number]  Weight:       130.0 lb Date of Birth:  24-Apr-1991   BSA:          1.610 m Patient Age:    32 years    BP:           104/63 mmHg Patient Gender: M           HR:           94 bpm. Exam Location:  Inpatient Procedure: 2D Echo, Cardiac Doppler and Color Doppler (Both Spectral  and Color            Flow Doppler were utilized during procedure). Indications:    Murmur  History:        Patient has prior history of Echocardiogram examinations, most                 recent 02/05/2024. Risk Factors:Hypertension.  Sonographer:    Astrid Blamer Referring Phys: [id number] Sharion Davidson DIXON  Sonographer Comments: Patient rolled on right side. IMPRESSIONS  1. Left ventricular ejection fraction, by estimation, is 60 to 65%. The left ventricle has normal function. The left ventricle has no regional wall motion abnormalities. Left ventricular diastolic parameters were normal.  2. Right ventricular systolic function is normal. The right ventricular size is normal.  3. The mitral valve is normal in structure. No evidence of mitral valve regurgitation. No evidence of mitral stenosis.  4. The aortic valve was not well visualized. Aortic valve regurgitation is not visualized. No aortic stenosis  is present.  5. The inferior vena cava is normal in size with greater than 50% respiratory variability, suggesting right atrial pressure of 3 mmHg.  6. Cannot exclude a small PFO. Conclusion(s)/Recommendation(s): No evidence of valvular vegetations on this transthoracic echocardiogram. Consider a transesophageal echocardiogram to exclude infective endocarditis if clinically indicated. FINDINGS  Left Ventricle: No 3D or strain transmitted. Left ventricular ejection fraction, by estimation, is 60 to 65%. The left ventricle has normal function. The left ventricle has no regional wall motion abnormalities. Strain was performed and the global longitudinal strain is indeterminate. The left ventricular internal cavity size was normal in size. There is no left ventricular hypertrophy. Left ventricular diastolic parameters were normal. Right Ventricle: The right ventricular size is normal. No increase in right ventricular wall thickness. Right ventricular systolic function is normal. Left Atrium: Left atrial size was normal in size. Right Atrium: Right atrial size was normal in size. Pericardium: There is no evidence of pericardial effusion. Mitral Valve: The mitral valve is normal in structure. No evidence of mitral valve regurgitation. No evidence of mitral valve stenosis. Tricuspid Valve: The tricuspid valve is normal in structure. Tricuspid valve regurgitation is not demonstrated. No evidence of tricuspid stenosis. Aortic Valve: The aortic valve was not well visualized. Aortic valve regurgitation is not visualized. No aortic stenosis is present. Aortic valve mean gradient measures 4.0 mmHg. Aortic valve peak gradient measures 6.2 mmHg. Aortic valve area, by VTI measures 2.16 cm. Pulmonic Valve: The pulmonic valve was normal in structure. Pulmonic valve regurgitation is not visualized. No evidence of pulmonic stenosis. Aorta: The aortic root and ascending aorta are structurally normal, with no evidence of dilitation.  Venous: The inferior vena cava is normal in size with greater than 50% respiratory variability, suggesting right atrial pressure of 3 mmHg. IAS/Shunts: Cannot exclude a small PFO. Additional Comments: 3D was performed not requiring image post processing on an independent workstation and was indeterminate.  LEFT VENTRICLE PLAX 2D LVIDd:         4.40 cm   Diastology LVIDs:         3.20 cm   LV e' medial:    10.00 cm/s LV PW:         0.60 cm   LV E/e' medial:  7.4 LV IVS:        0.70 cm   LV e' lateral:   17.80 cm/s LVOT diam:     1.70 cm   LV E/e' lateral: 4.2 LV SV:  35 LV SV Index:   22 LVOT Area:     2.27 cm  RIGHT VENTRICLE RV S prime:     9.90 cm/s TAPSE (M-mode): 1.6 cm LEFT ATRIUM           Index        RIGHT ATRIUM          Index LA Vol (A2C): 15.9 ml 9.87 ml/m   RA Area:     6.55 cm LA Vol (A4C): 30.1 ml 18.69 ml/m  RA Volume:   11.10 ml 6.89 ml/m  AORTIC VALVE AV Area (Vmax):    2.07 cm AV Area (Vmean):   1.83 cm AV Area (VTI):     2.16 cm AV Vmax:           124.00 cm/s AV Vmean:          94.700 cm/s AV VTI:            0.162 m AV Peak Grad:      6.2 mmHg AV Mean Grad:      4.0 mmHg LVOT Vmax:         113.00 cm/s LVOT Vmean:        76.500 cm/s LVOT VTI:          0.154 m LVOT/AV VTI ratio: 0.95 MITRAL VALVE MV Area (PHT): 3.56 cm    SHUNTS MV Decel Time: 213 msec    Systemic VTI:  0.15 m MV E velocity: 73.90 cm/s  Systemic Diam: 1.70 cm MV A velocity: 55.00 cm/s MV E/A ratio:  1.34 Gloriann Larger MD Electronically signed by Gloriann Larger MD Signature Date/Time: 03/12/2024/12:01:07 PM    Final      Assessment/Plan: MSSA septic arthritis to left hip = plan for 4 wk of cefazolin  through 6/18, then convert to oral cefadroxil  1000mg  po bid for several wks +. Would d/c on 30 days of treatment  Continue to work with pt/ot to gain strength  Hx of substance abuse = patient trying to go to drug rehab facility after discharge   We will see back in the ID clinic in 30 days  Will  sign off  [facility] Pager: [phone]  03/12/2024, 4:57 PM

## 2024-03-13 DIAGNOSIS — M869 Osteomyelitis, unspecified: Secondary | ICD-10-CM | POA: Diagnosis not present

## 2024-03-13 DIAGNOSIS — F1120 Opioid dependence, uncomplicated: Secondary | ICD-10-CM | POA: Diagnosis not present

## 2024-03-13 DIAGNOSIS — L03116 Cellulitis of left lower limb: Secondary | ICD-10-CM | POA: Diagnosis not present

## 2024-03-13 DIAGNOSIS — D638 Anemia in other chronic diseases classified elsewhere: Secondary | ICD-10-CM | POA: Diagnosis not present

## 2024-03-13 MED ORDER — CEFADROXIL 500 MG PO CAPS
1000.0000 mg | ORAL_CAPSULE | Freq: Two times a day (BID) | ORAL | Status: DC
  Administered 2024-03-20 – 2024-03-25 (×9): 1000 mg via ORAL
  Filled 2024-03-13 (×11): qty 2

## 2024-03-13 MED ORDER — CEFADROXIL 500 MG PO CAPS: 1000.0000 mg | ORAL_CAPSULE | Freq: Two times a day (BID) | ORAL | Status: DC

## 2024-03-13 NOTE — Plan of Care (Signed)

## 2024-03-13 NOTE — Progress Notes (Signed)
 Mobility Specialist: Progress Note   03/13/24 1452  Mobility  Activity Ambulated with assistance in hallway  Level of Assistance Standby assist, set-up cues, supervision of patient - no hands on  Assistive Device Front wheel walker  Distance Ambulated (ft) 150 ft  LLE Weight Bearing Per Provider Order TWB  Activity Response Tolerated well  Mobility Referral Yes  Mobility visit 1 Mobility  Mobility Specialist Start Time (ACUTE ONLY) 1246  Mobility Specialist Stop Time (ACUTE ONLY) 1256  Mobility Specialist Time Calculation (min) (ACUTE ONLY) 10 min    Pt received in bed, agreeable to mobility session. SV throughout. Adhering to WB precaution well. Ambulated a lap in the hallway without fault. Left in bed with all needs met, call bell in reach.   Deloria Fetch Mobility Specialist Please contact via SecureChat or Rehab office at [phone]

## 2024-03-13 NOTE — Progress Notes (Signed)
 Physical Therapy Treatment Patient Details Name: Mark Hammond MRN: [id number] DOB: May 24, 1991 Today's Date: 03/13/2024   History of Present Illness 33 y/o male adm 02/19/24 with ongoing LLE pain with MSSA, cellulitis, osteomyelitis and septic arthritis. 5/23 resection of left femoral head, placement of antibiotic hip spacer (posterior approach) with I&D and VAC. PMhx: 4/30-5/16 for osteomyelitis and cellulitis of LLE w/ hip aspiration. ED visit 5/19 for OD. Alcoholic gastritis, BPD, Depression, drug abuse, ETOH abuse, hallucination, schizo affective schizophrenia.    PT Comments  Pt received in supine, agreeable to therapy session with encouragement, c/o moderate pain. Pt needing up to Supervision for cues to avoid L hip IR with transfers and mobility. Pt without acute s/sx distress other than c/o pain. Pt performs ~50% AROM with LLE LAQ attempt, does not tolerate AAROM well due to c/o pain. Pt encouraged to continue L knee extension stretches later after pain meds given to maintain L knee ROM once WB restrictions lessened. Pt defers curb step training this date, did perform well previous week however. Pt continues to benefit from PT services to progress toward functional mobility goals.    If plan is discharge home, recommend the following: Assist for transportation;Assistance with cooking/housework   Can travel by private Scientist, research (medical) walker (2 wheels);Wheelchair (measurements PT);Wheelchair cushion (measurements PT)    Recommendations for Other Services       Precautions / Restrictions Precautions Precautions: Fall;Posterior Hip Precaution Booklet Issued: Yes (comment) Recall of Precautions/Restrictions: Impaired Precaution/Restrictions Comments: with bed mobility pt tending to break 1/3 precs Required Braces or Orthoses: Other Brace Other Brace: hip abd pillow in room if pt able to tolerate supine position Restrictions Weight Bearing Restrictions  Per Provider Order: Yes LLE Weight Bearing Per Provider Order: Touchdown weight bearing     Mobility  Bed Mobility Overal bed mobility: Independent Bed Mobility: Rolling, Sidelying to Sit   Sidelying to sit: Independent       General bed mobility comments: no cues needed to EOB; pt requesting to remain sitting  EOB at end of session.    Transfers Overall transfer level: Modified independent Equipment used: Rolling walker (2 wheels) Transfers: Sit to/from Stand Sit to Stand: Modified independent (Device/Increase time)           General transfer comment: from EOB<>RW    Ambulation/Gait Ambulation/Gait assistance: Supervision Gait Distance (Feet): 150 Feet Assistive device: Rolling walker (2 wheels) Gait Pattern/deviations: Knee flexed in stance - left, Decreased dorsiflexion - left, Step-to pattern       General Gait Details: Pt able to perform LLE TDWB throughout trial this date, c/o constant L hip pain but no other acute s/sx distress and seems very consistent with maintaining L TDWB today.   Stairs         General stair comments: Offered to bring step for pt to practice curb step transfers again but he defers today.   Wheelchair Mobility     Tilt Bed    Modified Rankin (Stroke Patients Only)       Balance Overall balance assessment: Needs assistance Sitting-balance support: No upper extremity supported, Feet supported Sitting balance-Leahy Scale: Good Sitting balance - Comments: prefers UE support due to L hip pain   Standing balance support: During functional activity, Reliant on assistive device for balance Standing balance-Leahy Scale: Poor Standing balance comment: Poor due to L TWB status; Good with RW, poor without  Communication Communication Communication: No apparent difficulties  Cognition Arousal: Alert Behavior During Therapy: WFL for tasks assessed/performed, Flat affect   PT - Cognitive  impairments: No apparent impairments                       PT - Cognition Comments: Pt able to recall precs but needs reminder intermittently to avoid L hip IR Following commands: Intact      Cueing Cueing Techniques: Verbal cues  Exercises Other Exercises Other Exercises: seated LLE AROM: LAQ (some AA to improve ROM but pt does not tolerate well) x5 reps ; ROM limited due to pain/hamstring tightness with pt not able to tolerate prolonged stretching and defers PTA encouragement to allow low load/long duration knee extension stretch    General Comments        Pertinent Vitals/Pain Pain Assessment Pain Assessment: Faces Faces Pain Scale: Hurts even more Pain Location: L hip with TDWB and sitting EOB; per RN not due for meds until sometime after 4pm. Pain Descriptors / Indicators: Grimacing, Guarding, Throbbing, Stabbing, Sharp, Dull Pain Intervention(s): Limited activity within patient's tolerance, Monitored during session, Repositioned (pt defers ice pack)    Home Living                          Prior Function            PT Goals (current goals can now be found in the care plan section) Acute Rehab PT Goals Patient Stated Goal: decr pain and to be more independent so I can go to substance abuse rehab PT Goal Formulation: With patient Time For Goal Achievement: 03/10/24 Progress towards PT goals: Progressing toward goals    Frequency    Min 2X/week      PT Plan      Co-evaluation              AM-PAC PT 6 Clicks Mobility   Outcome Measure  Help needed turning from your back to your side while in a flat bed without using bedrails?: None Help needed moving from lying on your back to sitting on the side of a flat bed without using bedrails?: None Help needed moving to and from a bed to a chair (including a wheelchair)?: None Help needed standing up from a chair using your arms (e.g., wheelchair or bedside chair)?: None Help needed to walk  in hospital room?: A Little Help needed climbing 3-5 steps with a railing? : A Little 6 Click Score: 22    End of Session Equipment Utilized During Treatment: Gait belt Activity Tolerance: Patient tolerated treatment well Patient left: in bed;with call bell/phone within reach;Other (comment) (pt requesting to sit EOB, agreeable to use call bell prior to getting OOB so staff can ensure his safety.) Nurse Communication: Mobility status;Other (comment) (RN notified pt defers bed alarm and agrees to use call bell) PT Visit Diagnosis: Other abnormalities of gait and mobility (R26.89);Pain Pain - Right/Left: Left Pain - part of body: Hip     Time: 1610-9604 PT Time Calculation (min) (ACUTE ONLY): 10 min  Charges:    $Therapeutic Activity: 8-22 mins PT General Charges $$ ACUTE PT VISIT: 1 Visit                     Sophee Mckimmy P., PTA Acute Rehabilitation Services Secure Chat Preferred 9a-5:30pm Office: [phone]    Mariel Shope [facility] 03/13/2024, 3:17 PM

## 2024-03-13 NOTE — Progress Notes (Signed)
 Progress Note   Patient: Mark Hammond KGM:[id number] DOB: 1991/02/18 DOA: 02/19/2024     22 DOS: the patient was seen and examined on 03/13/2024   Brief hospital course: 33 year old M polysubstance use/IVDU was recently discharged on 5/16 after treatment for septic left hip arthritis.  Patient was readmitted with cellulitis of the left foot ankle.  Patient underwent left hip arthroplasty for chronic septic arthritis and osteomyelitis with collapse of femoral head with failed IR drainage and outpatient antibiotic treatment.  He is presently getting treatment for foot cellulitis.   5/27: Hemodynamically stable, will be difficult to place for SNF with history of polysubstance abuse and on Medicaid.  Patient wants to go to rehab for his substance abuse.  Discussed with ID and will continue antibiotics for total of 6 weeks.  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 Wound vac removed by ortho. Initial recommendation for 2-4 weeks of IV cefazolin  with transition to p.o. upon discharge.   ID follow up appreciated, TTE no vegetations. Advised IV cefazolin  till 6/18 and then switch to oral cefadroxil  1gm bid for several weeks with ID follow up. Continue Norco to 1 tab PRN, morphine  IV stopped. PT/ OT follow up, TOC to work on rehab once IV abx are finished.   Normocytic anemia Hemoglobin stable s/p 1 unit PRBCs on 5/27.  No active bleeding appreciated.    Left foot cellulitis Antibiotics as above.   Bipolar disorder Continue Seroquel .   Acute hypoxic respiratory failure secondary to opioid overdose Resolved.  Now on room air.  Polysubstance abuse: Discontinued CIWA protocol as he has been inpatient more than 20 days. Advised to quit illicit drugs. Rehab upon dc.       Out of bed to chair. Incentive spirometry. Nursing supportive care. Fall, aspiration  precautions. Diet:  Diet Orders (From admission, onward)     Start     Ordered   02/23/24 2029  Diet regular Room service appropriate? Yes; Fluid consistency: Thin  Diet effective now       Question Answer Comment  Room service appropriate? Yes   Fluid consistency: Thin      02/23/24 2028           DVT prophylaxis: SCDs Start: 02/23/24 2029  Level of care: Med-Surg   Code Status: Full Code  Subjective: Patient is seen and examined today morning. States he is in pain, did not work with PT.   Physical Exam: Vitals:   03/13/24 0344 03/13/24 0600 03/13/24 0748 03/13/24 1628  BP: (!) 98/58 100/67 (!) 102/57 (!) 98/56  Pulse: 76 73 85 66  Resp: 18  18 18   Temp: 98 F (36.7 C)  98 F (36.7 C) 98 F (36.7 C)  TempSrc:      SpO2: 100%  99% 100%  Weight:      Height:        General -  Young  Caucasian male, no apparent distress HEENT - PERRLA, EOMI, atraumatic head, non tender sinuses. Lung - Clear, no rales, rhonchi, wheezes. Heart - S1, S2 heard, no murmurs, rubs, no pedal edema. Abdomen - Soft, non tender, bowel sounds good Neuro - Alert, awake and oriented x 3, non focal exam. Skin - Warm and dry. Left hip surgical staples clean noted.  Data Reviewed:      Latest Ref Rng & Units 03/06/2024    5:43 AM 03/02/2024  4:24 AM 03/01/2024    8:13 AM  CBC  WBC 4.0 - 10.5 K/uL 4.7  8.1  5.9   Hemoglobin 13.0 - 17.0 g/dL 9.2  9.8  9.2   Hematocrit 39.0 - 52.0 % 28.7  30.1  28.3   Platelets 150 - 400 K/uL 311  391  362       Latest Ref Rng & Units 03/06/2024    5:43 AM 03/02/2024    4:24 AM 03/01/2024    8:13 AM  BMP  Glucose 70 - 99 mg/dL 952  841  324   BUN 6 - 20 mg/dL 7  8  11    Creatinine 0.61 - 1.24 mg/dL 4.01  0.27  2.53   Sodium 135 - 145 mmol/L 135  135  136   Potassium 3.5 - 5.1 mmol/L 3.8  3.6  3.3   Chloride 98 - 111 mmol/L 99  100  99   CO2 22 - 32 mmol/L 30  26  27    Calcium 8.9 - 10.3 mg/dL 9.5  9.0  9.3    ECHOCARDIOGRAM COMPLETE Result Date:  03/12/2024    ECHOCARDIOGRAM REPORT   Patient Name:   Garyson Zent Date of Exam: 03/12/2024 Medical Rec #:  [id number]   Height:       63.0 in Accession #:    [id number]  Weight:       130.0 lb Date of Birth:  1991/01/13   BSA:          1.610 m Patient Age:    32 years    BP:           104/63 mmHg Patient Gender: M           HR:           94 bpm. Exam Location:  Inpatient Procedure: 2D Echo, Cardiac Doppler and Color Doppler (Both Spectral and Color            Flow Doppler were utilized during procedure). Indications:    Murmur  History:        Patient has prior history of Echocardiogram examinations, most                 recent 02/05/2024. Risk Factors:Hypertension.  Sonographer:    Astrid Blamer Referring Phys: [id number] Sharion Davidson DIXON  Sonographer Comments: Patient rolled on right side. IMPRESSIONS  1. Left ventricular ejection fraction, by estimation, is 60 to 65%. The left ventricle has normal function. The left ventricle has no regional wall motion abnormalities. Left ventricular diastolic parameters were normal.  2. Right ventricular systolic function is normal. The right ventricular size is normal.  3. The mitral valve is normal in structure. No evidence of mitral valve regurgitation. No evidence of mitral stenosis.  4. The aortic valve was not well visualized. Aortic valve regurgitation is not visualized. No aortic stenosis is present.  5. The inferior vena cava is normal in size with greater than 50% respiratory variability, suggesting right atrial pressure of 3 mmHg.  6. Cannot exclude a small PFO. Conclusion(s)/Recommendation(s): No evidence of valvular vegetations on this transthoracic echocardiogram. Consider a transesophageal echocardiogram to exclude infective endocarditis if clinically indicated. FINDINGS  Left Ventricle: No 3D or strain transmitted. Left ventricular ejection fraction, by estimation, is 60 to 65%. The left ventricle has normal function. The left ventricle has no regional wall motion  abnormalities. Strain was performed and the global longitudinal strain is indeterminate. The left ventricular internal cavity size was normal in size. There  is no left ventricular hypertrophy. Left ventricular diastolic parameters were normal. Right Ventricle: The right ventricular size is normal. No increase in right ventricular wall thickness. Right ventricular systolic function is normal. Left Atrium: Left atrial size was normal in size. Right Atrium: Right atrial size was normal in size. Pericardium: There is no evidence of pericardial effusion. Mitral Valve: The mitral valve is normal in structure. No evidence of mitral valve regurgitation. No evidence of mitral valve stenosis. Tricuspid Valve: The tricuspid valve is normal in structure. Tricuspid valve regurgitation is not demonstrated. No evidence of tricuspid stenosis. Aortic Valve: The aortic valve was not well visualized. Aortic valve regurgitation is not visualized. No aortic stenosis is present. Aortic valve mean gradient measures 4.0 mmHg. Aortic valve peak gradient measures 6.2 mmHg. Aortic valve area, by VTI measures 2.16 cm. Pulmonic Valve: The pulmonic valve was normal in structure. Pulmonic valve regurgitation is not visualized. No evidence of pulmonic stenosis. Aorta: The aortic root and ascending aorta are structurally normal, with no evidence of dilitation. Venous: The inferior vena cava is normal in size with greater than 50% respiratory variability, suggesting right atrial pressure of 3 mmHg. IAS/Shunts: Cannot exclude a small PFO. Additional Comments: 3D was performed not requiring image post processing on an independent workstation and was indeterminate.  LEFT VENTRICLE PLAX 2D LVIDd:         4.40 cm   Diastology LVIDs:         3.20 cm   LV e' medial:    10.00 cm/s LV PW:         0.60 cm   LV E/e' medial:  7.4 LV IVS:        0.70 cm   LV e' lateral:   17.80 cm/s LVOT diam:     1.70 cm   LV E/e' lateral: 4.2 LV SV:         35 LV SV Index:    22 LVOT Area:     2.27 cm  RIGHT VENTRICLE RV S prime:     9.90 cm/s TAPSE (M-mode): 1.6 cm LEFT ATRIUM           Index        RIGHT ATRIUM          Index LA Vol (A2C): 15.9 ml 9.87 ml/m   RA Area:     6.55 cm LA Vol (A4C): 30.1 ml 18.69 ml/m  RA Volume:   11.10 ml 6.89 ml/m  AORTIC VALVE AV Area (Vmax):    2.07 cm AV Area (Vmean):   1.83 cm AV Area (VTI):     2.16 cm AV Vmax:           124.00 cm/s AV Vmean:          94.700 cm/s AV VTI:            0.162 m AV Peak Grad:      6.2 mmHg AV Mean Grad:      4.0 mmHg LVOT Vmax:         113.00 cm/s LVOT Vmean:        76.500 cm/s LVOT VTI:          0.154 m LVOT/AV VTI ratio: 0.95 MITRAL VALVE MV Area (PHT): 3.56 cm    SHUNTS MV Decel Time: 213 msec    Systemic VTI:  0.15 m MV E velocity: 73.90 cm/s  Systemic Diam: 1.70 cm MV A velocity: 55.00 cm/s MV E/A ratio:  1.34 Gloriann Larger MD Electronically signed by  Gloriann Larger MD Signature Date/Time: 03/12/2024/12:01:07 PM    Final     Family Communication: Discussed with patient, he understand and agree. All questions answered.  Disposition: Status is: Inpatient Remains inpatient appropriate because: IV antibiotics till 6/18, drug rehab program.  Planned Discharge Destination: Rehab     Time spent: 38 minutes  Author: Aisha Hove, MD 03/13/2024 6:12 PM Secure chat 7am to 7pm For on call review [email or web address].

## 2024-03-14 DIAGNOSIS — L03116 Cellulitis of left lower limb: Secondary | ICD-10-CM | POA: Diagnosis not present

## 2024-03-14 DIAGNOSIS — M869 Osteomyelitis, unspecified: Secondary | ICD-10-CM | POA: Diagnosis not present

## 2024-03-14 DIAGNOSIS — D638 Anemia in other chronic diseases classified elsewhere: Secondary | ICD-10-CM | POA: Diagnosis not present

## 2024-03-14 DIAGNOSIS — F1120 Opioid dependence, uncomplicated: Secondary | ICD-10-CM | POA: Diagnosis not present

## 2024-03-14 NOTE — Progress Notes (Signed)
 Progress Note   Patient: Mark Hammond BJY:[id number] DOB: 03-17-1991 DOA: 02/19/2024     23 DOS: the patient was seen and examined on 03/14/2024   Brief hospital course: 33 year old M polysubstance use/IVDU was recently discharged on 5/16 after treatment for septic left hip arthritis.  Patient was readmitted with cellulitis of the left foot ankle.  Patient underwent left hip arthroplasty for chronic septic arthritis and osteomyelitis with collapse of femoral head with failed IR drainage and outpatient antibiotic treatment.  He is presently getting treatment for foot cellulitis.   5/27: Hemodynamically stable, will be difficult to place for SNF with history of polysubstance abuse and on Medicaid.  Patient wants to go to rehab for his substance abuse.  Discussed with ID and will continue antibiotics for total of 6 weeks.  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 Wound vac removed by ortho. Initial recommendation for 2-4 weeks of IV cefazolin  with transition to p.o. upon discharge.   Repeat echo 6/10 unremarkable. ID follow up advised IV cefazolin  till 6/18 and then switch to oral cefadroxil  1gm bid for several weeks with ID follow up. Continue Norco to 1 tab PRN, no IV meds ordered. PT/ OT follow up, TOC to work on rehab once IV abx are finished.   Normocytic anemia Hemoglobin stable s/p 1 unit PRBCs on 5/27.  No active bleeding appreciated.    Left foot cellulitis Antibiotics as above.   Bipolar disorder Continue Seroquel .   Acute hypoxic respiratory failure secondary to opioid overdose Resolved.  Now on room air.  Polysubstance abuse: Discontinued CIWA protocol as he has been inpatient more than 20 days. Advised to quit illicit drugs. Rehab upon dc.       Out of bed to chair. Incentive spirometry. Nursing supportive care. Fall, aspiration  precautions. Diet:  Diet Orders (From admission, onward)     Start     Ordered   02/23/24 2029  Diet regular Room service appropriate? Yes; Fluid consistency: Thin  Diet effective now       Question Answer Comment  Room service appropriate? Yes   Fluid consistency: Thin      02/23/24 2028           DVT prophylaxis: SCDs Start: 02/23/24 2029  Level of care: Med-Surg   Code Status: Full Code  Subjective: Patient is seen and examined today morning. States he feels better. No complaints.   Physical Exam: Vitals:   03/13/24 1628 03/13/24 1941 03/14/24 0410 03/14/24 0751  BP: (!) 98/56 (!) 98/57 (!) 88/55 (!) 111/52  Pulse: 66 68 76 69  Resp: 18 16 16 17   Temp: 98 F (36.7 C) 98.8 F (37.1 C) 98.1 F (36.7 C) 98.6 F (37 C)  TempSrc:  Oral Oral   SpO2: 100% 100% 100% 95%  Weight:      Height:        General -  Young male, no apparent distress HEENT - PERRLA, EOMI, atraumatic head, non tender sinuses. Lung - Clear, no rales, rhonchi, wheezes. Heart - S1, S2 heard, no murmurs, rubs, no pedal edema. Abdomen - Soft, non tender, bowel sounds good Neuro - Alert, awake and oriented x 3, non focal exam. Skin - Warm and dry. Left hip surgical staples clean noted.  Data Reviewed:      Latest Ref Rng & Units 03/06/2024    5:43 AM 03/02/2024    4:24  AM 03/01/2024    8:13 AM  CBC  WBC 4.0 - 10.5 K/uL 4.7  8.1  5.9   Hemoglobin 13.0 - 17.0 g/dL 9.2  9.8  9.2   Hematocrit 39.0 - 52.0 % 28.7  30.1  28.3   Platelets 150 - 400 K/uL 311  391  362       Latest Ref Rng & Units 03/06/2024    5:43 AM 03/02/2024    4:24 AM 03/01/2024    8:13 AM  BMP  Glucose 70 - 99 mg/dL 578  469  629   BUN 6 - 20 mg/dL 7  8  11    Creatinine 0.61 - 1.24 mg/dL 5.28  4.13  2.44   Sodium 135 - 145 mmol/L 135  135  136   Potassium 3.5 - 5.1 mmol/L 3.8  3.6  3.3   Chloride 98 - 111 mmol/L 99  100  99   CO2 22 - 32 mmol/L 30  26  27    Calcium 8.9 - 10.3 mg/dL 9.5  9.0  9.3    No results  found.   Family Communication: Discussed with patient, he understand and agree. All questions answered.  Disposition: Status is: Inpatient Remains inpatient appropriate because: IV antibiotics till 6/18, drug rehab program.  Planned Discharge Destination: Rehab     Time spent: 36 minutes  Author: Aisha Hove, MD 03/14/2024 3:38 PM Secure chat 7am to 7pm For on call review [email or web address].

## 2024-03-14 NOTE — Plan of Care (Signed)

## 2024-03-14 NOTE — Progress Notes (Signed)
 Occupational Therapy Treatment Patient Details Name: Mark Hammond MRN: [id number] DOB: Aug 16, 1991 Today's Date: 03/14/2024   History of present illness 33 y/o male adm 02/19/24 with ongoing LLE pain with MSSA, cellulitis, osteomyelitis and septic arthritis. 5/23 resection of left femoral head, placement of antibiotic hip spacer (posterior approach) with I&D and VAC. PMhx: 4/30-5/16 for osteomyelitis and cellulitis of LLE w/ hip aspiration. ED visit 5/19 for OD. Alcoholic gastritis, BPD, Depression, drug abuse, ETOH abuse, hallucination, schizo affective schizophrenia.   OT comments  Pt resting comfortably in bed, increased pain to L hip and B hands with OOB activity using RW, 6/10, but tolerates well. Pt improving consistently with ADLs and mobility with RW. Pt able to complete bedside ADLs with set up using adaptive equipment, using sock aide and long-handled sponge. Pt transfers with RW well, able to throw items away and transport items with one hand supported on RW leaning L/R on RLE without LOB, arranging table and making bed mod I. Pt ambulated 350 feet with RW, c/o pain to B hands with use of RW. Pt improving well, will continue to see acutely to maximize independence with ADLs and mobility, no OT follow up expected.       If plan is discharge home, recommend the following:  Assistance with cooking/housework;Direct supervision/assist for medications management;Assist for transportation;Help with stairs or ramp for entrance;A little help with bathing/dressing/bathroom   Equipment Recommendations  BSC/3in1;Other (comment);Tub/shower seat (RW)    Recommendations for Other Services      Precautions / Restrictions Precautions Precautions: Fall;Posterior Hip Precaution Booklet Issued: Yes (comment) Recall of Precautions/Restrictions: Impaired Precaution/Restrictions Comments: with bed mobility pt tending to break 1/3 precs Required Braces or Orthoses: Other Brace Other Brace: hip abd pillow  in room if pt able to tolerate supine position Restrictions Weight Bearing Restrictions Per Provider Order: Yes LLE Weight Bearing Per Provider Order: Touchdown weight bearing       Mobility Bed Mobility Overal bed mobility: Modified Independent             General bed mobility comments: increased time    Transfers Overall transfer level: Modified independent Equipment used: Rolling walker (2 wheels) Transfers: Sit to/from Stand, Bed to chair/wheelchair/BSC Sit to Stand: Modified independent (Device/Increase time)     Step pivot transfers: Modified independent (Device/Increase time)     General transfer comment: mod I using RW, able to stand with one hand supported on RW while arranging items leaning L/R as needed.     Balance Overall balance assessment: Needs assistance Sitting-balance support: No upper extremity supported, Feet supported Sitting balance-Leahy Scale: Good     Standing balance support: Single extremity supported, During functional activity Standing balance-Leahy Scale: Fair Standing balance comment: able to stand and managei tems with one hand supported on RW, able to throw items away while standing reaching for trash, able to open/close door, no LOB                           ADL either performed or assessed with clinical judgement   ADL Overall ADL's : Needs assistance/impaired                                       General ADL Comments: set up/supervision using adaptive equipment    Extremity/Trunk Assessment Upper Extremity Assessment Upper Extremity Assessment: Overall WFL for tasks assessed  Vision       Perception     Praxis     Communication Communication Communication: No apparent difficulties   Cognition Arousal: Alert Behavior During Therapy: WFL for tasks assessed/performed, Flat affect Cognition: No apparent impairments                               Following  commands: Intact        Cueing   Cueing Techniques: Verbal cues  Exercises      Shoulder Instructions       General Comments      Pertinent Vitals/ Pain       Pain Assessment Pain Assessment: 0-10 Pain Score: 6  Pain Location: L hip with TDWB and sitting EOB, B hands when using RW Pain Descriptors / Indicators: Grimacing, Guarding, Throbbing, Stabbing, Sharp, Dull Pain Intervention(s): Monitored during session  Home Living                                          Prior Functioning/Environment              Frequency  Min 2X/week        Progress Toward Goals  OT Goals(current goals can now be found in the care plan section)  Progress towards OT goals: Progressing toward goals  Acute Rehab OT Goals Patient Stated Goal: to start WB through LLE OT Goal Formulation: With patient Time For Goal Achievement: 03/23/24 Potential to Achieve Goals: Good ADL Goals Pt Will Perform Grooming: Independently;standing;sitting Pt Will Perform Upper Body Bathing: Independently;sitting Pt Will Perform Lower Body Bathing: Independently;with adaptive equipment;sitting/lateral leans Pt Will Perform Upper Body Dressing: Independently;sitting Pt Will Perform Lower Body Dressing: Independently;sitting/lateral leans;with adaptive equipment Pt Will Transfer to Toilet: Independently;ambulating;bedside commode;regular height toilet Pt Will Perform Tub/Shower Transfer: Tub transfer;Shower transfer;Independently;ambulating;3 in 1;rolling walker;shower seat Additional ADL Goal #1: Pt to manage ADLs/mobility > 10 min standing without seated rest break  Plan      Co-evaluation                 AM-PAC OT 6 Clicks Daily Activity     Outcome Measure   Help from another person eating meals?: None Help from another person taking care of personal grooming?: A Little Help from another person toileting, which includes using toliet, bedpan, or urinal?: A Little Help  from another person bathing (including washing, rinsing, drying)?: A Little Help from another person to put on and taking off regular upper body clothing?: A Little Help from another person to put on and taking off regular lower body clothing?: A Little 6 Click Score: 19    End of Session Equipment Utilized During Treatment: Gait belt;Rolling walker (2 wheels)  OT Visit Diagnosis: Unsteadiness on feet (R26.81);Other abnormalities of gait and mobility (R26.89);Muscle weakness (generalized) (M62.81)   Activity Tolerance Patient tolerated treatment well   Patient Left in bed;with call bell/phone within reach   Nurse Communication Mobility status        Time: 1242-1300 OT Time Calculation (min): 18 min  Charges: OT General Charges $OT Visit: 1 Visit OT Treatments $Therapeutic Activity: 8-22 mins  [address] OTR/L   Scherry Curtis 03/14/2024, 1:29 PM

## 2024-03-15 ENCOUNTER — Encounter (HOSPITAL_COMMUNITY): Payer: Self-pay | Admitting: Internal Medicine

## 2024-03-15 DIAGNOSIS — F1120 Opioid dependence, uncomplicated: Secondary | ICD-10-CM | POA: Diagnosis not present

## 2024-03-15 DIAGNOSIS — L03116 Cellulitis of left lower limb: Secondary | ICD-10-CM | POA: Diagnosis not present

## 2024-03-15 DIAGNOSIS — M869 Osteomyelitis, unspecified: Secondary | ICD-10-CM | POA: Diagnosis not present

## 2024-03-15 DIAGNOSIS — D638 Anemia in other chronic diseases classified elsewhere: Secondary | ICD-10-CM | POA: Diagnosis not present

## 2024-03-15 NOTE — TOC Progression Note (Signed)
 Transition of Care [facility]) - Progression Note    Patient Details  Name: Mark Hammond MRN: [id number] Date of Birth: 08-22-1991  Transition of Care [facility]) CM/SW Contact  Lillee Mooneyhan A [place], LCSW Phone Number: 03/15/2024, 11:37 AM  Clinical Narrative:     CSW met with pt at bedside. He said that he was agreeable for CSW to send referral to [facility] for review. He said that [facility] could be a good option in the future and would keep as a resource.   CSW sent referral to [facility] for review, [phone]. CSW to follow up regarding status of application when able.    TOC will continue to follow.        Expected Discharge Plan and Services                                              Social Determinants of Health (SDOH) Interventions SDOH Screenings   Food Insecurity: Food Insecurity Present (02/20/2024)  Housing: High Risk (02/20/2024)  Transportation Needs: Unmet Transportation Needs (02/20/2024)  Utilities: At Risk (02/20/2024)  Alcohol Screen: High Risk (02/23/2023)  Depression (PHQ2-9): Low Risk  (01/17/2024)  Recent Concern: Depression (PHQ2-9) - High Risk (01/14/2024)  Social Connections: Unknown (02/20/2024)  Tobacco Use: High Risk (03/15/2024)    Readmission Risk Interventions    02/26/2024    4:21 PM 02/20/2024    3:21 PM 02/02/2024   11:06 AM  Readmission Risk Prevention Plan  Transportation Screening Complete Complete Complete  Medication Review Oceanographer) Complete Complete Complete  PCP or Specialist appointment within 3-5 days of discharge Complete Complete Complete  HRI or Home Care Consult Complete Complete Complete  SW Recovery Care/Counseling Consult Not Complete Complete Complete  Palliative Care Screening Not Applicable Not Applicable Not Applicable  Skilled Nursing Facility Not Applicable Not Applicable Not Applicable

## 2024-03-15 NOTE — Plan of Care (Signed)

## 2024-03-15 NOTE — Progress Notes (Signed)
 Progress Note   Patient: Mark Hammond [id number]:[id number] DOB: Oct 03, 1991 DOA: 02/19/2024     24 DOS: the patient was seen and examined on 03/15/2024   Brief hospital course: 33 year old M polysubstance use/IVDU was recently discharged on 5/16 after treatment for septic left hip arthritis.  Patient was readmitted with cellulitis of the left foot ankle.  Patient underwent left hip arthroplasty for chronic septic arthritis and osteomyelitis with collapse of femoral head with failed IR drainage and outpatient antibiotic treatment.  He is presently getting treatment for foot cellulitis.   5/27: Hemodynamically stable, will be difficult to place for SNF with history of polysubstance abuse and on Medicaid.  Patient wants to go to rehab for his substance abuse.  Discussed with ID and will continue antibiotics for total of 6 weeks.  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 Wound vac removed by ortho. Initial recommendation for 2-4 weeks of IV cefazolin  with transition to p.o. upon discharge.   Repeat echo 6/10 unremarkable. ID follow up advised IV cefazolin  till 6/18 and then switch to oral cefadroxil  1gm bid for several weeks with ID follow up outpatient. Tapered Norco to 1 tab PRN, no IV meds ordered. PT/ OT follow up, TOC to drug rehab place once IV abx are finished.   Normocytic anemia Hemoglobin stable s/p 1 unit PRBCs on 5/27.  No active bleeding appreciated.    Left foot cellulitis Antibiotics as above.   Bipolar disorder Continue Seroquel .   Acute hypoxic respiratory failure secondary to opioid overdose Resolved.  Now on room air.  Polysubstance abuse: Discontinued CIWA protocol as he has been inpatient more than 20 days. Advised to quit illicit drugs. Rehab upon dc.       Out of bed to chair. Incentive spirometry. Nursing supportive care. Fall,  aspiration precautions. Diet:  Diet Orders (From admission, onward)     Start     Ordered   02/23/24 2029  Diet regular Room service appropriate? Yes; Fluid consistency: Thin  Diet effective now       Question Answer Comment  Room service appropriate? Yes   Fluid consistency: Thin      02/23/24 2028           DVT prophylaxis: SCDs Start: 02/23/24 2029  Level of care: Med-Surg   Code Status: Full Code  Subjective: Patient is seen and examined today morning. States he feels better, working with PT. Has pain but bearable.   Physical Exam: Vitals:   03/14/24 1732 03/14/24 1924 03/15/24 0427 03/15/24 0756  BP: (!) 101/58 105/60 101/62 (!) 102/59  Pulse: 64 71 73 77  Resp: 17 17 17 18   Temp: 98 F (36.7 C) 98 F (36.7 C) 97.9 F (36.6 C) 98.1 F (36.7 C)  TempSrc:      SpO2: 100% 100% 100% 100%  Weight:      Height:        General -  Young male, no apparent distress HEENT - PERRLA, EOMI, atraumatic head, non tender sinuses. Lung - Clear, no rales, rhonchi, wheezes. Heart - S1, S2 heard, no murmurs, rubs, no pedal edema. Abdomen - Soft, non tender, bowel sounds good Neuro - Alert, awake and oriented x 3, non focal exam. Skin - Warm and dry. Left hip surgical staples clean noted.  Data Reviewed:      Latest Ref Rng & Units 03/06/2024    5:43 AM 03/02/2024  4:24 AM 03/01/2024    8:13 AM  CBC  WBC 4.0 - 10.5 K/uL 4.7  8.1  5.9   Hemoglobin 13.0 - 17.0 g/dL 9.2  9.8  9.2   Hematocrit 39.0 - 52.0 % 28.7  30.1  28.3   Platelets 150 - 400 K/uL 311  391  362       Latest Ref Rng & Units 03/06/2024    5:43 AM 03/02/2024    4:24 AM 03/01/2024    8:13 AM  BMP  Glucose 70 - 99 mg/dL 295  621  308   BUN 6 - 20 mg/dL 7  8  11    Creatinine 0.61 - 1.24 mg/dL 6.57  8.46  9.62   Sodium 135 - 145 mmol/L 135  135  136   Potassium 3.5 - 5.1 mmol/L 3.8  3.6  3.3   Chloride 98 - 111 mmol/L 99  100  99   CO2 22 - 32 mmol/L 30  26  27    Calcium 8.9 - 10.3 mg/dL 9.5  9.0  9.3     No results found.   Family Communication: Discussed with patient, he understand and agree. All questions answered.  Disposition: Status is: Inpatient Remains inpatient appropriate because: IV antibiotics till 6/18, drug rehab program.  Planned Discharge Destination: Rehab     Time spent: 35 minutes  Author: Aisha Hove, MD 03/15/2024 1:46 PM Secure chat 7am to 7pm For on call review [email or web address].

## 2024-03-15 NOTE — Progress Notes (Signed)
 Physical Therapy Treatment Patient Details Name: Mark Hammond MRN: [id number] DOB: 10-26-1990 Today's Date: 03/15/2024   History of Present Illness 33 y/o male adm 02/19/24 with ongoing LLE pain with MSSA, cellulitis, osteomyelitis and septic arthritis. 5/23 resection of left femoral head, placement of antibiotic hip spacer (posterior approach) with I&D and VAC. PMhx: 4/30-5/16 for osteomyelitis and cellulitis of LLE w/ hip aspiration. ED visit 5/19 for OD. Alcoholic gastritis, BPD, Depression, drug abuse, ETOH abuse, hallucination, schizo affective schizophrenia.    PT Comments  Pt reports he would like to work on gait training today, defers exercises due to pain. Pt ambulatory around unit with use of RW and cues for form. Pt concerned about LLE feeling shorter than R, PT encouraged pt keeping neutral hip and knee extension during rest and gait due to pt holding RLE in hip and knee flexion. Pt with good maintenance of TDWB LLE, and pt can verbalize posterior hip precautions but struggles with application during mobility. PT to continue to follow while acute.      If plan is discharge home, recommend the following: Assist for transportation;Assistance with cooking/housework   Can travel by private Scientist, research (medical) walker (2 wheels);Wheelchair (measurements PT);Wheelchair cushion (measurements PT)    Recommendations for Other Services       Precautions / Restrictions Precautions Precautions: Fall;Posterior Hip Precaution Booklet Issued: Yes (comment) Recall of Precautions/Restrictions: Impaired Precaution/Restrictions Comments: pt able to verbalize his hip precautions but has trouble following, especially hip adduction and IR during bed mobility. Required Braces or Orthoses: Other Brace Other Brace: hip abd pillow in room if pt able to tolerate supine position Restrictions Weight Bearing Restrictions Per Provider Order: Yes LLE Weight Bearing Per Provider  Order: Touchdown weight bearing     Mobility  Bed Mobility Overal bed mobility: Modified Independent             General bed mobility comments: increased time    Transfers Overall transfer level: Modified independent Equipment used: Rolling walker (2 wheels) Transfers: Sit to/from Stand Sit to Stand: Supervision           General transfer comment: for safety, cues for hand placement when rising and sitting.    Ambulation/Gait Ambulation/Gait assistance: Supervision Gait Distance (Feet): 200 Feet Assistive device: Rolling walker (2 wheels) Gait Pattern/deviations: Knee flexed in stance - left, Decreased dorsiflexion - left, Step-to pattern Gait velocity: decr     General Gait Details: Pt performing TDWB LLE well, cues for extending L hip to neutral and keeping L knee as close to full extension as possible to prevent hip and knee flexion contractures. additional cues for upright posture   Stairs             Wheelchair Mobility     Tilt Bed    Modified Rankin (Stroke Patients Only)       Balance Overall balance assessment: Needs assistance Sitting-balance support: No upper extremity supported, Feet supported Sitting balance-Leahy Scale: Good     Standing balance support: Single extremity supported, During functional activity Standing balance-Leahy Scale: Fair Standing balance comment: can stand statically with SL support, needs RW dynamically                            Communication Communication Communication: No apparent difficulties  Cognition Arousal: Alert Behavior During Therapy: WFL for tasks assessed/performed, Flat affect   PT - Cognitive impairments: Safety/Judgement  PT - Cognition Comments: some safety awareness and precautions difficulty, can be impulsive Following commands: Intact Following commands impaired: Only follows one step commands consistently    Cueing Cueing Techniques:  Verbal cues  Exercises      General Comments        Pertinent Vitals/Pain Pain Assessment Pain Assessment: 0-10 Pain Score: 7  Pain Location: L hip with TDWB and sitting EOB, B hands when using RW Pain Descriptors / Indicators: Grimacing, Guarding, Throbbing, Stabbing, Sharp, Dull Pain Intervention(s): Limited activity within patient's tolerance, Monitored during session, Repositioned    Home Living                          Prior Function            PT Goals (current goals can now be found in the care plan section) Acute Rehab PT Goals Patient Stated Goal: decr pain and to be more independent so I can go to substance abuse rehab PT Goal Formulation: With patient Time For Goal Achievement: 03/29/24 Potential to Achieve Goals: Fair Additional Goals Additional Goal #1: Pt will verbalize and demonstrate posterior hip precautions. Progress towards PT goals: Progressing toward goals    Frequency    Min 2X/week      PT Plan      Co-evaluation              AM-PAC PT 6 Clicks Mobility   Outcome Measure  Help needed turning from your back to your side while in a flat bed without using bedrails?: None Help needed moving from lying on your back to sitting on the side of a flat bed without using bedrails?: None Help needed moving to and from a bed to a chair (including a wheelchair)?: None Help needed standing up from a chair using your arms (e.g., wheelchair or bedside chair)?: None Help needed to walk in hospital room?: A Little Help needed climbing 3-5 steps with a railing? : A Little 6 Click Score: 22    End of Session   Activity Tolerance: Patient tolerated treatment well Patient left: in bed;with call bell/phone within reach;with bed alarm set Nurse Communication: Mobility status PT Visit Diagnosis: Other abnormalities of gait and mobility (R26.89);Pain Pain - Right/Left: Left Pain - part of body: Hip     Time: 1226-1238 PT Time  Calculation (min) (ACUTE ONLY): 12 min  Charges:    $Gait Training: 8-22 mins PT General Charges $$ ACUTE PT VISIT: 1 Visit                     Shirlene Doughty, PT DPT Acute Rehabilitation Services Secure Chat Preferred  Office [phone]    Mindy Gali E Burnadette Carrion 03/15/2024, 3:05 PM

## 2024-03-15 NOTE — Progress Notes (Signed)
 Psychosocial Progressive/Outcome: ANOx4, calm and cooperative  Pain/Comfort Progression/Outcome: Pain managed with PRN medication   Clinical Progression/Outcome: Adequate fluid and diet intake  Independent in positioning  Worked w/ PT  SBA w/ walker  Voids to urinal  No BM reported  Slept in between care Maintained safety

## 2024-03-16 DIAGNOSIS — L03116 Cellulitis of left lower limb: Secondary | ICD-10-CM | POA: Diagnosis not present

## 2024-03-16 DIAGNOSIS — M869 Osteomyelitis, unspecified: Secondary | ICD-10-CM | POA: Diagnosis not present

## 2024-03-16 NOTE — Plan of Care (Signed)

## 2024-03-16 NOTE — Progress Notes (Signed)
  Progress Note   Patient: Mark Hammond BJY:[id number] DOB: [birth year] DOA: 02/19/2024     25 DOS: the patient was seen and examined on 03/16/2024   Brief hospital course: 33 year old M polysubstance use/IVDU was recently discharged on 5/16 after treatment for septic left hip arthritis.  Patient was readmitted with cellulitis of the left foot ankle.  Patient underwent left hip arthroplasty for chronic septic arthritis and osteomyelitis with collapse of femoral head with failed IR drainage and outpatient antibiotic treatment.  He is presently getting treatment for foot cellulitis.   5/27: Hemodynamically stable, will be difficult to place for SNF with history of polysubstance abuse and on Medicaid.  Patient wants to go to rehab for his substance abuse.  Discussed with ID and will continue antibiotics for total of 6 weeks.   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 Wound vac removed by ortho. Initial recommendation for 2-4 weeks of IV cefazolin  with transition to p.o. upon discharge.   Repeat echo 6/10 unremarkable. ID follow up advised IV cefazolin  till 6/18 and then switch to oral cefadroxil  1gm bid for several weeks with ID follow up outpatient. Tapered Norco to 1 tab PRN, no IV meds ordered. PT/ OT follow up. Once IV abx are finished, planning possible inpt drug rehab program   Normocytic anemia Hemoglobin stable s/p 1 unit PRBCs on 5/27.  No active bleeding appreciated.    Left foot cellulitis Antibiotics as above.   Bipolar disorder Continue Seroquel .   Acute hypoxic respiratory failure secondary to opioid overdose Resolved.  Now on room air.   Polysubstance abuse: Discontinued CIWA protocol as he has been inpatient more than 20 days. Advised to quit illicit drugs. Rehab upon dc.         Subjective: Without complaints this AM  Physical  Exam: Vitals:   03/15/24 1532 03/15/24 1934 03/16/24 0439 03/16/24 0857  BP: 107/67 102/63 113/60 96/61  Pulse: 85 82 76 73  Resp: 18 18 18 17   Temp: 98.1 F (36.7 C) 98 F (36.7 C) 98.1 F (36.7 C) 98.2 F (36.8 C)  TempSrc:   Oral Oral  SpO2: 98% 100% 100% 100%  Weight:      Height:       General exam: Awake, laying in bed, in nad Respiratory system: Normal respiratory effort, no wheezing Cardiovascular system: regular rate, s1, s2 Gastrointestinal system: Soft, nondistended, positive BS Central nervous system: CN2-12 grossly intact, strength intact Extremities: Perfused, no clubbing Skin: Normal skin turgor, no notable skin lesions seen Psychiatry: Mood normal // no visual hallucinations   Data Reviewed:  There are no new results to review at this time.  Family Communication: Pt in room, family not at bedside  Disposition: Status is: Inpatient Remains inpatient appropriate because: severity of illness  Planned Discharge Destination: Home     Author: Cherylle Corwin, MD 03/16/2024 3:17 PM  For on call review [email or web address].

## 2024-03-17 DIAGNOSIS — M869 Osteomyelitis, unspecified: Secondary | ICD-10-CM | POA: Diagnosis not present

## 2024-03-17 DIAGNOSIS — L03116 Cellulitis of left lower limb: Secondary | ICD-10-CM | POA: Diagnosis not present

## 2024-03-17 NOTE — Plan of Care (Signed)

## 2024-03-17 NOTE — Progress Notes (Signed)
  Progress Note   Patient: Mark Hammond VWU:[id number] DOB: 01/04/1991 DOA: 02/19/2024     26 DOS: the patient was seen and examined on 03/17/2024   Brief hospital course: 33 year old M polysubstance use/IVDU was recently discharged on 5/16 after treatment for septic left hip arthritis.  Patient was readmitted with cellulitis of the left foot ankle.  Patient underwent left hip arthroplasty for chronic septic arthritis and osteomyelitis with collapse of femoral head with failed IR drainage and outpatient antibiotic treatment.  He is presently getting treatment for foot cellulitis.   5/27: Hemodynamically stable, will be difficult to place for SNF with history of polysubstance abuse and on Medicaid.  Patient wants to go to rehab for his substance abuse.  Discussed with ID and will continue antibiotics for total of 6 weeks.   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 Wound vac removed by ortho. Initial recommendation for 2-4 weeks of IV cefazolin  with transition to p.o. upon discharge.   Repeat echo 6/10 unremarkable. ID follow up advised IV cefazolin  till 6/18 and then switch to oral cefadroxil  1gm bid for several weeks with ID follow up outpatient. Tapered Norco to 1 tab PRN, no IV meds ordered. PT/ OT follow up. Once IV abx are finished, TOC is looking into possible drug rehab program   Normocytic anemia Hemoglobin stable s/p 1 unit PRBCs on 5/27.  No active bleeding appreciated.    Left foot cellulitis Antibiotics as above.   Bipolar disorder Continue Seroquel .   Acute hypoxic respiratory failure secondary to opioid overdose Resolved.  Now on room air.   Polysubstance abuse: Discontinued CIWA protocol as he has been inpatient more than 20 days. Advised to quit illicit drugs. Rehab upon dc.         Subjective: No complaints today  Physical  Exam: Vitals:   03/16/24 2000 03/17/24 0520 03/17/24 0739 03/17/24 1623  BP: 112/84 103/62 (!) 107/59 (!) 101/59  Pulse: 78 76 92 80  Resp: 18 18    Temp: (!) 97 F (36.1 C) 97.8 F (36.6 C) 98 F (36.7 C) 98 F (36.7 C)  TempSrc: Oral Oral    SpO2: 100% 100% 100% 100%  Weight:      Height:       General exam: Conversant, in no acute distress Respiratory system: normal chest rise, clear, no audible wheezing Cardiovascular system: regular rhythm, s1-s2 Gastrointestinal system: Nondistended, nontender, pos BS Central nervous system: No seizures, no tremors Extremities: No cyanosis, no joint deformities Skin: No rashes, no pallor Psychiatry: Affect normal // no auditory hallucinations   Data Reviewed:  There are no new results to review at this time.  Family Communication: Pt in room, family not at bedside  Disposition: Status is: Inpatient Remains inpatient appropriate because: severity of illness  Planned Discharge Destination: Home     Author: Cherylle Corwin, MD 03/17/2024 4:52 PM  For on call review [email or web address].

## 2024-03-18 DIAGNOSIS — M869 Osteomyelitis, unspecified: Secondary | ICD-10-CM | POA: Diagnosis not present

## 2024-03-18 DIAGNOSIS — L03116 Cellulitis of left lower limb: Secondary | ICD-10-CM | POA: Diagnosis not present

## 2024-03-18 NOTE — Progress Notes (Signed)
 Mobility Specialist: Progress Note   03/18/24 1221  Mobility  Activity Ambulated independently in hallway  Level of Assistance Modified independent, requires aide device or extra time  Assistive Device Front wheel walker  Distance Ambulated (ft) 225 ft  LLE Weight Bearing Per Provider Order TWB  Activity Response Tolerated well  Mobility Referral Yes  Mobility visit 1 Mobility  Mobility Specialist Start Time (ACUTE ONLY) 1123  Mobility Specialist Stop Time (ACUTE ONLY) 1136  Mobility Specialist Time Calculation (min) (ACUTE ONLY) 13 min    Pt received in bed sidelying on L hip, agreeable to mobility session. ModI throughout. Adhering to WB and posterior hip precautions well. Returned to room without fault. Left in bed sidelying on R hip with a pillow between his knees. All needs met, call bell in reach.   Deloria Fetch Mobility Specialist Please contact via SecureChat or Rehab office at [phone]

## 2024-03-18 NOTE — Progress Notes (Signed)
  Progress Note   Patient: Mark Hammond VWU:[id number] DOB: 1991-06-14 DOA: 02/19/2024     27 DOS: the patient was seen and examined on 03/18/2024   Brief hospital course: 33 year old M polysubstance use/IVDU was recently discharged on 5/16 after treatment for septic left hip arthritis.  Patient was readmitted with cellulitis of the left foot ankle.  Patient underwent left hip arthroplasty for chronic septic arthritis and osteomyelitis with collapse of femoral head with failed IR drainage and outpatient antibiotic treatment.  He is presently getting treatment for foot cellulitis.   5/27: Hemodynamically stable, will be difficult to place for SNF with history of polysubstance abuse and on Medicaid.  Patient wants to go to rehab for his substance abuse.  Discussed with ID and will continue antibiotics for total of 6 weeks.   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 Wound vac removed by ortho. Initial recommendation for 2-4 weeks of IV cefazolin  with transition to p.o. upon discharge.   Repeat echo 6/10 unremarkable. ID follow up advised IV cefazolin  till 6/18 and then switch to oral cefadroxil  1gm bid for several weeks with ID follow up outpatient. Tapered Norco to 1 tab PRN, no IV meds ordered. PT/ OT follow up. Once IV abx are finished, Drug rehab program search in process per TOC   Normocytic anemia Hemoglobin stable s/p 1 unit PRBCs on 5/27.  No active bleeding appreciated.    Left foot cellulitis Antibiotics as above.   Bipolar disorder Continue Seroquel .   Acute hypoxic respiratory failure secondary to opioid overdose Resolved.  Now on room air.   Polysubstance abuse: Discontinued CIWA protocol as he has been inpatient more than 20 days. Advised to quit illicit drugs. Rehab upon dc.         Subjective: Without complaints  Physical  Exam: Vitals:   03/17/24 2003 03/18/24 0448 03/18/24 0726 03/18/24 1503  BP: 102/61 (!) 110/52 (!) 95/52 102/60  Pulse: 84 69 70 69  Resp: 18 17 18 19   Temp: 98.6 F (37 C) 97.6 F (36.4 C) 98.4 F (36.9 C) 98.4 F (36.9 C)  TempSrc:      SpO2: 100% 100% 100% 100%  Weight:      Height:       General exam: Awake, laying in bed, in nad Respiratory system: Normal respiratory effort, no wheezing Cardiovascular system: regular rate, s1, s2 Gastrointestinal system: Soft, nondistended, positive BS Central nervous system: CN2-12 grossly intact, strength intact Extremities: Perfused, no clubbing Skin: Normal skin turgor, no notable skin lesions seen Psychiatry: Mood normal // no visual hallucinations   Data Reviewed:  There are no new results to review at this time.  Family Communication: Pt in room, family not at bedside  Disposition: Status is: Inpatient Remains inpatient appropriate because: severity of illness  Planned Discharge Destination: Home     Author: Cherylle Corwin, MD 03/18/2024 3:14 PM  For on call review [email or web address].

## 2024-03-18 NOTE — Plan of Care (Signed)

## 2024-03-19 ENCOUNTER — Inpatient Hospital Stay: Payer: MEDICAID | Admitting: Internal Medicine

## 2024-03-19 DIAGNOSIS — L03116 Cellulitis of left lower limb: Secondary | ICD-10-CM | POA: Diagnosis not present

## 2024-03-19 DIAGNOSIS — M869 Osteomyelitis, unspecified: Secondary | ICD-10-CM | POA: Diagnosis not present

## 2024-03-19 NOTE — Plan of Care (Signed)

## 2024-03-19 NOTE — TOC Progression Note (Signed)
 Transition of Care [facility]) - Progression Note    Patient Details  Name: Mark Hammond MRN: [id number] Date of Birth: 20-Feb-1991  [facility] CM/SW Contact  Dane Dung, RN Phone Number: 03/19/2024, 3:32 PM  Clinical Narrative:    Cm spoke with [place], MSW and she asked that RW be ordered for the patient.  I called [employer] and requested RW be delivered to the bedside.    [place], [facility] MSW is exploring options for residential treatment center for substance abuse for the patient at this time.  Patient will have IV antibiotics completed tomorrow.  Patient is homeless and was sleeping in the community prior to admission to the hospital.        Expected Discharge Plan and Services                                               Social Determinants of Health (SDOH) Interventions SDOH Screenings   Food Insecurity: Food Insecurity Present (02/20/2024)  Housing: High Risk (02/20/2024)  Transportation Needs: Unmet Transportation Needs (02/20/2024)  Utilities: At Risk (02/20/2024)  Alcohol Screen: High Risk (02/23/2023)  Depression (PHQ2-9): Low Risk  (01/17/2024)  Recent Concern: Depression (PHQ2-9) - High Risk (01/14/2024)  Social Connections: Unknown (02/20/2024)  Tobacco Use: High Risk (03/15/2024)    Readmission Risk Interventions    02/26/2024    4:21 PM 02/20/2024    3:21 PM 02/02/2024   11:06 AM  Readmission Risk Prevention Plan  Transportation Screening Complete Complete Complete  Medication Review Oceanographer) Complete Complete Complete  PCP or Specialist appointment within 3-5 days of discharge Complete Complete Complete  HRI or Home Care Consult Complete Complete Complete  SW Recovery Care/Counseling Consult Not Complete Complete Complete  Palliative Care Screening Not Applicable Not Applicable Not Applicable  Skilled Nursing Facility Not Applicable Not Applicable Not Applicable

## 2024-03-19 NOTE — Progress Notes (Signed)
  Progress Note   Patient: Mark Hammond JXB:[id number] DOB: 09/16/1991 DOA: 02/19/2024     33 DOS: the patient was seen and examined on 03/19/2024   Brief hospital course: 33 year old M polysubstance use/IVDU was recently discharged on 5/16 after treatment for septic left hip arthritis.  Patient was readmitted with cellulitis of the left foot ankle.  Patient underwent left hip arthroplasty for chronic septic arthritis and osteomyelitis with collapse of femoral head with failed IR drainage and outpatient antibiotic treatment.  He is presently getting treatment for foot cellulitis.   5/27: Hemodynamically stable, will be difficult to place for SNF with history of polysubstance abuse and on Medicaid.  Patient wants to go to rehab for his substance abuse.  Discussed with ID and will continue antibiotics for total of 6 weeks.   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 Wound vac removed by ortho. Initial recommendation for 2-4 weeks of IV cefazolin  with transition to p.o. upon discharge.   Repeat echo 6/10 unremarkable. ID follow up advised IV cefazolin  till 6/18 and then switch to oral cefadroxil  1gm bid for several weeks with ID follow up outpatient. Tapered Norco to 1 tab PRN, no IV meds ordered. PT/ OT follow up. Once IV abx are finished, planning possible inpt drug rehab program. TOC is following   Normocytic anemia Hemoglobin stable s/p 1 unit PRBCs on 5/27.  No active bleeding appreciated.    Left foot cellulitis Antibiotics as above.   Bipolar disorder Continue Seroquel .   Acute hypoxic respiratory failure secondary to opioid overdose Resolved.  Now on room air.   Polysubstance abuse: Discontinued CIWA protocol as he has been inpatient more than 20 days. Advised to quit illicit drugs. Planning possible rehab      Subjective: No complaints  today  Physical Exam: Vitals:   03/18/24 1926 03/19/24 0421 03/19/24 0718 03/19/24 0743  BP: (!) 101/55 100/60 (!) 97/57 112/61  Pulse: 76 84 89 76  Resp: 18 19 18    Temp: 98.1 F (36.7 C) 98.2 F (36.8 C) 97.8 F (36.6 C)   TempSrc:   Oral   SpO2: 100% 100%    Weight:      Height:       General exam: Conversant, in no acute distress Respiratory system: normal chest rise, clear, no audible wheezing Cardiovascular system: regular rhythm, s1-s2 Gastrointestinal system: Nondistended, nontender, pos BS Central nervous system: No seizures, no tremors Extremities: No cyanosis, no joint deformities Skin: No rashes, no pallor Psychiatry: Affect normal // no auditory hallucinations   Data Reviewed:  There are no new results to review at this time.  Family Communication: Pt in room, family not at bedside  Disposition: Status is: Inpatient Remains inpatient appropriate because: severity of illness  Planned Discharge Destination: Home     Author: Cherylle Corwin, MD 03/19/2024 3:27 PM  For on call review [email or web address].

## 2024-03-19 NOTE — Progress Notes (Signed)
 Physical Therapy Treatment Patient Details Name: Mark Hammond MRN: [id number] DOB: 1991-03-19 Today's Date: 03/19/2024   History of Present Illness 33 y/o male adm 02/19/24 with ongoing LLE pain with MSSA, cellulitis, osteomyelitis and septic arthritis. 5/23 resection of left femoral head, placement of antibiotic hip spacer (posterior approach) with I&D and VAC. PMhx: 4/30-5/16 for osteomyelitis and cellulitis of LLE w/ hip aspiration. ED visit 5/19 for OD. Alcoholic gastritis, BPD, Depression, drug abuse, ETOH abuse, hallucination, schizo affective schizophrenia.    PT Comments  Pt received sitting EOB, agreeable to therapy session and with good participation and tolerance for transfer and gait trial with RW support. Pt able to perform transfers/gait with RW at modI level, not needing physical assist or instruction on safety/sequencing this date. Reviewed LLE AROM and seated/standing L knee extension to reduce risk of contracture and pt able to demo improved L knee extension this date sitting EOB and reports he's been working on supine position and L knee extension at rest. Pt has concerns of LLE limb length discrepancy, PTA mentioned potential to use L heel lift/shoe insert once WB status relaxed to allow full WBAT if he does truly have limb length discrepancy after recent surgery. Pt continues to benefit from PT services to progress toward functional mobility goals.     If plan is discharge home, recommend the following: Assist for transportation;Assistance with cooking/housework   Can travel by private Scientist, research (medical) walker (2 wheels);Wheelchair (measurements PT);Wheelchair cushion (measurements PT)    Recommendations for Other Services       Precautions / Restrictions Precautions Precautions: Fall;Posterior Hip Precaution Booklet Issued: Yes (comment) Recall of Precautions/Restrictions: Intact Precaution/Restrictions Comments: pt able to verbalize his  hip precautions Required Braces or Orthoses: Other Brace Other Brace: hip abd pillow in room if pt able to tolerate supine position Restrictions Weight Bearing Restrictions Per Provider Order: Yes LLE Weight Bearing Per Provider Order: Touchdown weight bearing     Mobility  Bed Mobility Overal bed mobility: Independent             General bed mobility comments: received EOB, remains sitting EOB at end of session. Pt reports no difficulties this date when performing using extra pillow to prevent excessive hip IR.    Transfers Overall transfer level: Modified independent Equipment used: Rolling walker (2 wheels) Transfers: Sit to/from Stand Sit to Stand: Modified independent (Device/Increase time)           General transfer comment: to/from EOB and RW    Ambulation/Gait Ambulation/Gait assistance: Modified independent (Device/Increase time) Gait Distance (Feet): 125 Feet Assistive device: Rolling walker (2 wheels) Gait Pattern/deviations: Knee flexed in stance - left, Decreased dorsiflexion - left, Step-to pattern Gait velocity: pt variable speed wtih intermittent standing breaks to stretch LLE and rest his palms     General Gait Details: Pt performing TDWB LLE well, good RW management   Stairs             Wheelchair Mobility     Tilt Bed    Modified Rankin (Stroke Patients Only)       Balance Overall balance assessment: Needs assistance Sitting-balance support: No upper extremity supported, Feet supported Sitting balance-Leahy Scale: Good Sitting balance - Comments: able to sit unsupported drinking soda   Standing balance support: Single extremity supported, During functional activity Standing balance-Leahy Scale: Fair (with RW) Standing balance comment: can stand statically with SL support, needs RW dynamically  Communication Communication Communication: No apparent difficulties  Cognition Arousal:  Alert Behavior During Therapy: WFL for tasks assessed/performed, Flat affect   PT - Cognitive impairments: No apparent impairments                         Following commands: Intact      Cueing Cueing Techniques: Verbal cues  Exercises Other Exercises Other Exercises: seated LLE AROM: LAQ  x5 reps with improved ROM this week compared with last week    General Comments General comments (skin integrity, edema, etc.): no acute s/sx distress, VSS per chart review      Pertinent Vitals/Pain Pain Assessment Pain Assessment: Faces Faces Pain Scale: Hurts little more Pain Location: LLE hip Pain Descriptors / Indicators: Grimacing, Guarding Pain Intervention(s): Limited activity within patient's tolerance, Monitored during session, Repositioned    Home Living                          Prior Function            PT Goals (current goals can now be found in the care plan section) Acute Rehab PT Goals PT Goal Formulation: With patient Time For Goal Achievement: 03/29/24 Progress towards PT goals: Progressing toward goals    Frequency    Min 2X/week      PT Plan      Co-evaluation              AM-PAC PT 6 Clicks Mobility   Outcome Measure  Help needed turning from your back to your side while in a flat bed without using bedrails?: None Help needed moving from lying on your back to sitting on the side of a flat bed without using bedrails?: None Help needed moving to and from a bed to a chair (including a wheelchair)?: None Help needed standing up from a chair using your arms (e.g., wheelchair or bedside chair)?: None Help needed to walk in hospital room?: None Help needed climbing 3-5 steps with a railing? : A Little 6 Click Score: 23    End of Session   Activity Tolerance: Patient tolerated treatment well Patient left: in bed;with call bell/phone within reach;Other (comment) (pt sitting EOB, agreeable to try sitting 20-30 mins more after  already sitting EOB ~30 mins prior to session) Nurse Communication: Mobility status PT Visit Diagnosis: Other abnormalities of gait and mobility (R26.89);Pain Pain - Right/Left: Left Pain - part of body: Hip;Leg     Time: 1914-7829 PT Time Calculation (min) (ACUTE ONLY): 11 min  Charges:    $Gait Training: 8-22 mins PT General Charges $$ ACUTE PT VISIT: 1 Visit                     Dunbar Buras P., PTA Acute Rehabilitation Services Secure Chat Preferred 9a-5:30pm Office: [phone]    Arville Laughter 03/19/2024, 5:35 PM

## 2024-03-19 NOTE — Progress Notes (Signed)
 Occupational Therapy Treatment Patient Details Name: Mark Hammond MRN: [id number] DOB: 1991-09-28 Today's Date: 03/19/2024   History of present illness 33 y/o male adm 02/19/24 with ongoing LLE pain with MSSA, cellulitis, osteomyelitis and septic arthritis. 5/23 resection of left femoral head, placement of antibiotic hip spacer (posterior approach) with I&D and VAC. PMhx: 4/30-5/16 for osteomyelitis and cellulitis of LLE w/ hip aspiration. ED visit 5/19 for OD. Alcoholic gastritis, BPD, Depression, drug abuse, ETOH abuse, hallucination, schizo affective schizophrenia.   OT comments  Pt progressing toward goals this session, needing up to min A for ADLs (LB ADL), ind with bed mobility and supervision for transfers with RW. Pt reporting numbness in bil palms and pain in hands form pressing on RW, educated on rest, taking breaks with movement and coordination exercises for BUE. Pt presenting with impairments listed below, will follow acutely. Continue to anticipate no OT follow up needs at d/c.       If plan is discharge home, recommend the following:  Assistance with cooking/housework;Direct supervision/assist for medications management;Assist for transportation;Help with stairs or ramp for entrance;A little help with bathing/dressing/bathroom   Equipment Recommendations  BSC/3in1;Tub/shower seat;Other (comment) (RW)    Recommendations for Other Services PT consult    Precautions / Restrictions Precautions Precautions: Fall;Posterior Hip Precaution Booklet Issued: Yes (comment) Recall of Precautions/Restrictions: Impaired Precaution/Restrictions Comments: pt able to verbalize his hip precautions but has trouble following, especially hip adduction and IR during bed mobility. Required Braces or Orthoses: Other Brace Other Brace: hip abd pillow in room if pt able to tolerate supine position Restrictions Weight Bearing Restrictions Per Provider Order: Yes LLE Weight Bearing Per Provider Order:  Touchdown weight bearing       Mobility Bed Mobility Overal bed mobility: Independent                  Transfers Overall transfer level: Modified independent Equipment used: Rolling walker (2 wheels) Transfers: Sit to/from Stand Sit to Stand: Supervision                 Balance Overall balance assessment: Needs assistance Sitting-balance support: No upper extremity supported, Feet supported Sitting balance-Leahy Scale: Good Sitting balance - Comments: prefers UE support due to L hip pain   Standing balance support: Single extremity supported, During functional activity Standing balance-Leahy Scale: Fair                             ADL either performed or assessed with clinical judgement   ADL Overall ADL's : Needs assistance/impaired Eating/Feeding: Independent   Grooming: Supervision/safety;Standing Grooming Details (indicate cue type and reason): simulated via functional mobility         Upper Body Dressing : Supervision/safety;Sitting   Lower Body Dressing: Minimal assistance Lower Body Dressing Details (indicate cue type and reason): aware of how to use sock aid for LLE Toilet Transfer: Supervision/safety;Ambulation;Regular Toilet;Rolling walker (2 wheels)           Functional mobility during ADLs: Supervision/safety;Rolling walker (2 wheels)      Extremity/Trunk Assessment Upper Extremity Assessment Upper Extremity Assessment: Overall WFL for tasks assessed   Lower Extremity Assessment Lower Extremity Assessment: Defer to PT evaluation        Vision   Vision Assessment?: No apparent visual deficits   Perception Perception Perception: Not tested   Praxis Praxis Praxis: Not tested   Communication Communication Communication: No apparent difficulties   Cognition Arousal: Alert Behavior During Therapy: [facility] for  tasks assessed/performed, Flat affect Cognition: No apparent impairments                                Following commands: Intact        Cueing   Cueing Techniques: Verbal cues  Exercises      Shoulder Instructions       General Comments VSS    Pertinent Vitals/ Pain       Pain Assessment Pain Assessment: Faces Pain Score: 4  Faces Pain Scale: Hurts little more Pain Location: BUE when using RW Pain Descriptors / Indicators: Grimacing, Guarding, Throbbing, Stabbing, Sharp, Dull Pain Intervention(s): Limited activity within patient's tolerance, Monitored during session, Repositioned  Home Living                                          Prior Functioning/Environment              Frequency  Min 2X/week        Progress Toward Goals  OT Goals(current goals can now be found in the care plan section)  Progress towards OT goals: Progressing toward goals  Acute Rehab OT Goals Patient Stated Goal: did not state OT Goal Formulation: With patient Time For Goal Achievement: 03/23/24 Potential to Achieve Goals: Good ADL Goals Pt Will Perform Grooming: Independently;standing;sitting Pt Will Perform Upper Body Bathing: Independently;sitting Pt Will Perform Lower Body Bathing: Independently;with adaptive equipment;sitting/lateral leans Pt Will Perform Upper Body Dressing: Independently;sitting Pt Will Perform Lower Body Dressing: Independently;sitting/lateral leans;with adaptive equipment Pt Will Transfer to Toilet: Independently;ambulating;bedside commode;regular height toilet Pt Will Perform Tub/Shower Transfer: Tub transfer;Shower transfer;Independently;ambulating;3 in 1;rolling walker;shower seat Additional ADL Goal #1: Pt to manage ADLs/mobility > 10 min standing without seated rest break  Plan      Co-evaluation                 AM-PAC OT 6 Clicks Daily Activity     Outcome Measure   Help from another person eating meals?: None Help from another person taking care of personal grooming?: A Little Help from another person  toileting, which includes using toliet, bedpan, or urinal?: A Little Help from another person bathing (including washing, rinsing, drying)?: A Little Help from another person to put on and taking off regular upper body clothing?: A Little Help from another person to put on and taking off regular lower body clothing?: A Little 6 Click Score: 19    End of Session Equipment Utilized During Treatment: Rolling walker (2 wheels)  OT Visit Diagnosis: Unsteadiness on feet (R26.81);Other abnormalities of gait and mobility (R26.89);Muscle weakness (generalized) (M62.81)   Activity Tolerance Patient tolerated treatment well   Patient Left in bed;with call bell/phone within reach   Nurse Communication Mobility status        Time: 3086-5784 OT Time Calculation (min): 20 min  Charges: OT General Charges $OT Visit: 1 Visit OT Treatments $Self Care/Home Management : 8-22 mins  Chizuko Trine K, OTD, OTR/L SecureChat Preferred Acute Rehab [phone]   Benedict Brain Koonce 03/19/2024, 1:45 PM

## 2024-03-19 NOTE — TOC Progression Note (Addendum)
 Transition of Care [facility]) - Progression Note    Patient Details  Name: Mark Hammond MRN: [id number] Date of Birth: [birth year]  [facility] CM/SW Contact  Qualyn Oyervides A [place], LCSW Phone Number: 03/19/2024, 3:21 PM  Clinical Narrative:     Update 1635:  Pt completed screening, CSW followed up with [facility]. Requested CSW reach back out tomorrow once they had completed the approval process for possible placement bed availability date.   CSW concted [facility] regarding referral. Fax referral received, pt needs to complete prescreening. CSW provided contact information for pt to complete phone assessment, he said he would complete this afternoon.    TOC will continue to follow.        Expected Discharge Plan and Services                                               Social Determinants of Health (SDOH) Interventions SDOH Screenings   Food Insecurity: Food Insecurity Present (02/20/2024)  Housing: High Risk (02/20/2024)  Transportation Needs: Unmet Transportation Needs (02/20/2024)  Utilities: At Risk (02/20/2024)  Alcohol Screen: High Risk (02/23/2023)  Depression (PHQ2-9): Low Risk  (01/17/2024)  Recent Concern: Depression (PHQ2-9) - High Risk (01/14/2024)  Social Connections: Unknown (02/20/2024)  Tobacco Use: High Risk (03/15/2024)    Readmission Risk Interventions    02/26/2024    4:21 PM 02/20/2024    3:21 PM 02/02/2024   11:06 AM  Readmission Risk Prevention Plan  Transportation Screening Complete Complete Complete  Medication Review Oceanographer) Complete Complete Complete  PCP or Specialist appointment within 3-5 days of discharge Complete Complete Complete  HRI or Home Care Consult Complete Complete Complete  SW Recovery Care/Counseling Consult Not Complete Complete Complete  Palliative Care Screening Not Applicable Not Applicable Not Applicable  Skilled Nursing Facility Not Applicable Not Applicable Not Applicable

## 2024-03-20 DIAGNOSIS — M869 Osteomyelitis, unspecified: Secondary | ICD-10-CM | POA: Diagnosis not present

## 2024-03-20 LAB — COMPREHENSIVE METABOLIC PANEL WITH GFR
ALT: 15 U/L (ref 0–44)
AST: 39 U/L (ref 15–41)
Albumin: 3.3 g/dL — ABNORMAL LOW (ref 3.5–5.0)
Alkaline Phosphatase: 105 U/L (ref 38–126)
Anion gap: 8 (ref 5–15)
BUN: <5 mg/dL — ABNORMAL LOW (ref 6–20)
CO2: 25 mmol/L (ref 22–32)
Calcium: 8.7 mg/dL — ABNORMAL LOW (ref 8.9–10.3)
Chloride: 104 mmol/L (ref 98–111)
Creatinine, Ser: 0.6 mg/dL — ABNORMAL LOW (ref 0.61–1.24)
GFR, Estimated: >60 mL/min (ref >60)
Glucose, Bld: 97 mg/dL (ref 70–99)
Potassium: 3.8 mmol/L (ref 3.5–5.1)
Sodium: 137 mmol/L (ref 135–145)
Total Bilirubin: 0.5 mg/dL (ref 0.0–1.2)
Total Protein: 7.5 g/dL (ref 6.5–8.1)

## 2024-03-20 LAB — CBC
HCT: 30.9 % — ABNORMAL LOW (ref 39.0–52.0)
Hemoglobin: 10 g/dL — ABNORMAL LOW (ref 13.0–17.0)
MCH: 28.7 pg (ref 26.0–34.0)
MCHC: 32.4 g/dL (ref 30.0–36.0)
MCV: 88.8 fL (ref 80.0–100.0)
Platelets: 279 10*3/uL (ref 150–400)
RBC: 3.48 MIL/uL — ABNORMAL LOW (ref 4.22–5.81)
RDW: 13.2 % (ref 11.5–15.5)
WBC: 3.7 10*3/uL — ABNORMAL LOW (ref 4.0–10.5)
nRBC: 0 % (ref 0.0–0.2)

## 2024-03-20 MED ORDER — HYDROCODONE-ACETAMINOPHEN 7.5-325 MG PO TABS
1.0000 | ORAL_TABLET | Freq: Two times a day (BID) | ORAL | Status: DC | PRN
  Administered 2024-03-20 – 2024-03-25 (×9): 1 via ORAL
  Filled 2024-03-20 (×12): qty 1

## 2024-03-20 NOTE — Progress Notes (Signed)
 Progress Note    Mark Hammond   ZOX:[id number]  DOB: Mar 11, 1991  DOA: 02/19/2024     29 PCP: Mark Hammond, No Pcp Per  Initial CC: Left hip pain  Hospital Course: 33 year old M polysubstance use/IVDU was recently discharged on 5/16 after treatment for septic left hip arthritis.  Mark Hammond was readmitted with cellulitis of the left foot ankle.  Mark Hammond underwent left hip arthroplasty for chronic septic arthritis and osteomyelitis with collapse of femoral head with failed IR drainage and outpatient antibiotic treatment.     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 Wound vac removed by ortho. Initial recommendation for 2-4 weeks of IV cefazolin  with transition to p.o. upon discharge.   - Repeat echo 6/10 unremarkable. ID follow up advised IV cefazolin  till 6/18 and then switch to oral cefadroxil  1gm bid for several weeks with ID follow up outpatient. - [facility] being pursued; no narcotics allowed; will taper norco off - Technically medically stable for discharge once disposition confirmed   Normocytic anemia Hemoglobin stable s/p 1 unit PRBCs on 5/27.  No active bleeding appreciated.    Left foot cellulitis Antibiotics as above.   Bipolar disorder Continue Seroquel .   Acute hypoxic respiratory failure secondary to opioid overdose-resolved Resolved.  Now on room air.   Polysubstance abuse: Discontinued CIWA protocol as he has been inpatient more than 20 days. Advised to quit illicit drugs. Planning possible rehab  Interval History:  Resting in bed.  Cooperative and no concerns.  Pursuing [facility] at this time.    Old records reviewed in assessment of this Mark Hammond  DVT prophylaxis:  SCDs Start: 02/23/24 2029   Code Status:   Code Status: Full Code  Mobility Assessment (Last 72 Hours)     Mobility Assessment     Row Name 03/20/24 07:56:55  03/19/24 2100 03/19/24 1700 03/19/24 1300 03/19/24 0830   Does Mark Hammond have an order for bedrest or is Mark Hammond medically unstable No - Continue assessment No - Continue assessment -- -- No - Continue assessment   What is the highest level of mobility based on the progressive mobility assessment? Level 5 (Walks with assist in room/hall) - Balance while stepping forward/back and can walk in room with assist - Complete Level 5 (Walks with assist in room/hall) - Balance while stepping forward/back and can walk in room with assist - Complete Level 5 (Walks with assist in room/hall) - Balance while stepping forward/back and can walk in room with assist - Complete Level 4 (Walks with assist in room) - Balance while marching in place and cannot step forward and back - Complete Level 4 (Walks with assist in room) - Balance while marching in place and cannot step forward and back - Complete   Is the above level different from baseline mobility prior to current illness? Yes - Recommend PT order Yes - Recommend PT order -- -- Yes - Recommend PT order    Row Name 03/18/24 2100 03/18/24 0900 03/17/24 2143       Does Mark Hammond have an order for bedrest or is Mark Hammond medically unstable No - Continue assessment No - Continue assessment No - Continue assessment     What is the highest level of mobility based on the progressive mobility assessment? Level 4 (Walks with assist in room) - Balance while marching in place and cannot step forward and back - Complete Level 4 (Walks with assist  in room) - Balance while marching in place and cannot step forward and back - Complete Level 4 (Walks with assist in room) - Balance while marching in place and cannot step forward and back - Complete     Is the above level different from baseline mobility prior to current illness? Yes - Recommend PT order Yes - Recommend PT order --        Barriers to discharge: awaiting safer dispo; ?[facility] Disposition Plan:  [facility]? Status is:  Inpt  Objective: Blood pressure (!) 95/53, pulse (!) 103, temperature 98.2 F (36.8 C), resp. rate 19, height 5' 3 (1.6 m), weight 59 kg, SpO2 100%.  Examination:  Physical Exam Constitutional:      General: He is not in acute distress.    Appearance: Normal appearance.  HENT:     Head: Normocephalic and atraumatic.     Mouth/Throat:     Mouth: Mucous membranes are moist.   Eyes:     Extraocular Movements: Extraocular movements intact.    Cardiovascular:     Rate and Rhythm: Normal rate and regular rhythm.  Pulmonary:     Effort: Pulmonary effort is normal. No respiratory distress.     Breath sounds: Normal breath sounds. No wheezing.  Abdominal:     General: Bowel sounds are normal. There is no distension.     Palpations: Abdomen is soft.     Tenderness: There is no abdominal tenderness.   Musculoskeletal:        General: Normal range of motion.     Cervical back: Normal range of motion and neck supple.   Skin:    General: Skin is warm and dry.   Neurological:     General: No focal deficit present.     Mental Status: He is alert.   Psychiatric:        Mood and Affect: Mood normal.        Behavior: Behavior normal.      Consultants:  ID  Procedures:    Data Reviewed: Results for orders placed or performed during the hospital encounter of 02/19/24 (from the past 24 hours)  Comprehensive metabolic panel with GFR     Status: Abnormal   Collection Time: 03/20/24  4:15 AM  Result Value Ref Range   Sodium 137 135 - 145 mmol/L   Potassium 3.8 3.5 - 5.1 mmol/L   Chloride 104 98 - 111 mmol/L   CO2 25 22 - 32 mmol/L   Glucose, Bld 97 70 - 99 mg/dL   BUN <5 (L) 6 - 20 mg/dL   Creatinine, Ser 5.78 (L) 0.61 - 1.24 mg/dL   Calcium 8.7 (L) 8.9 - 10.3 mg/dL   Total Protein 7.5 6.5 - 8.1 g/dL   Albumin  3.3 (L) 3.5 - 5.0 g/dL   AST 39 15 - 41 U/L   ALT 15 0 - 44 U/L   Alkaline Phosphatase 105 38 - 126 U/L   Total Bilirubin 0.5 0.0 - 1.2 mg/dL   GFR, Estimated >46  >96 mL/min   Anion gap 8 5 - 15  CBC     Status: Abnormal   Collection Time: 03/20/24  4:15 AM  Result Value Ref Range   WBC 3.7 (L) 4.0 - 10.5 K/uL   RBC 3.48 (L) 4.22 - 5.81 MIL/uL   Hemoglobin 10.0 (L) 13.0 - 17.0 g/dL   HCT 29.5 (L) 28.4 - 13.2 %   MCV 88.8 80.0 - 100.0 fL   MCH 28.7 26.0 - 34.0 pg  MCHC 32.4 30.0 - 36.0 g/dL   RDW 82.9 56.2 - 13.0 %   Platelets 279 150 - 400 K/uL   nRBC 0.0 0.0 - 0.2 %    I have reviewed pertinent nursing notes, vitals, labs, and images as necessary. I have ordered labwork to follow up on as indicated.  I have reviewed the last notes from staff over past 24 hours. I have discussed Mark Hammond's care plan and test results with nursing staff, CM/SW, and other staff as appropriate.  Time spent: Greater than 50% of the 55 minute visit was spent in counseling/coordination of care for the Mark Hammond as laid out in the A&P.   LOS: 29 days   Faith Homes, MD [facility] 03/20/2024, 1:05 PM

## 2024-03-20 NOTE — Progress Notes (Signed)
 Mobility Specialist: Progress Note   03/20/24 1500  Mobility  Activity Ambulated independently in hallway  Level of Assistance Modified independent, requires aide device or extra time  Assistive Device Front wheel walker  Distance Ambulated (ft) 150 ft  LLE Weight Bearing Per Provider Order TWB  Activity Response Tolerated well  Mobility Referral Yes  Mobility visit 1 Mobility  Mobility Specialist Start Time (ACUTE ONLY) 1406  Mobility Specialist Stop Time (ACUTE ONLY) 1416  Mobility Specialist Time Calculation (min) (ACUTE ONLY) 10 min    Pt received in bed, agreeable to mobility session. ModI throughout. No complaints. Agreed to sit in the chair for a little while. Left in chair with all needs met, call bell in reach.   Deloria Fetch Mobility Specialist Please contact via SecureChat or Rehab office at [phone]

## 2024-03-20 NOTE — Plan of Care (Signed)

## 2024-03-20 NOTE — TOC Progression Note (Signed)
 Transition of Care [facility]) - Progression Note    Patient Details  Name: Mark Hammond MRN: [id number] Date of Birth: 1991-02-19  Transition of Care [facility]) CM/SW Contact  Dane Dung, RN Phone Number: 03/20/2024, 2:44 PM  Clinical Narrative:    CM spoke with [place], MSW and she spoke with [facility] for likely admission to the inpatient substance abuse facility in the next week if bed is available.  MD was made aware that patient will need to be transitioned off of his narcotic pain medication before he can be admitted to the facility.  Patient admitted to the hospital without proper clothing and shoes and patient states that he does not have a home nor belongings and will need - MSW to provide clothing and shoes prior to discharge.  Patient wears 9.5 men's shoe.  Clothing to be provided at bedside.  [employer] was called to deliver WC and RW to the bedside.  DME orders placed.         Expected Discharge Plan and Services                                               Social Determinants of Health (SDOH) Interventions SDOH Screenings   Food Insecurity: Food Insecurity Present (02/20/2024)  Housing: High Risk (02/20/2024)  Transportation Needs: Unmet Transportation Needs (02/20/2024)  Utilities: At Risk (02/20/2024)  Alcohol Screen: High Risk (02/23/2023)  Depression (PHQ2-9): Low Risk  (01/17/2024)  Recent Concern: Depression (PHQ2-9) - High Risk (01/14/2024)  Social Connections: Unknown (02/20/2024)  Tobacco Use: High Risk (03/15/2024)    Readmission Risk Interventions    02/26/2024    4:21 PM 02/20/2024    3:21 PM 02/02/2024   11:06 AM  Readmission Risk Prevention Plan  Transportation Screening Complete Complete Complete  Medication Review Oceanographer) Complete Complete Complete  PCP or Specialist appointment within 3-5 days of discharge Complete Complete Complete  HRI or Home Care Consult Complete Complete Complete  SW Recovery Care/Counseling Consult Not  Complete Complete Complete  Palliative Care Screening Not Applicable Not Applicable Not Applicable  Skilled Nursing Facility Not Applicable Not Applicable Not Applicable

## 2024-03-20 NOTE — TOC Progression Note (Addendum)
 Transition of Care [facility]) - Progression Note    Patient Details  Name: Mark Hammond MRN: [id number] Date of Birth: 1991/06/15  Transition of Care [facility]) CM/SW Contact  Davisha Linthicum A [place], LCSW Phone Number: 03/20/2024, 10:44 AM  Clinical Narrative:     Update  1228 Pt given tentative approval for acceptance to [facility]. Cannot DC with controlled substance pain medication and has to be independent with ADL's. CSW sent updated PT and progress notes to facility.  They said that he can have a wheelchair at residential treatment center. RNCM notified, assisted with obtaining DME. Provider notified regarding controlled substance pain management barrier.  CSW contacted [facility] services. They stated they would reach back out to CSW this afternoon, nursing was still reviewing pt for admission. CSW provided contact information.    TOC will continue to follow.         Expected Discharge Plan and Services                                               Social Determinants of Health (SDOH) Interventions SDOH Screenings   Food Insecurity: Food Insecurity Present (02/20/2024)  Housing: High Risk (02/20/2024)  Transportation Needs: Unmet Transportation Needs (02/20/2024)  Utilities: At Risk (02/20/2024)  Alcohol Screen: High Risk (02/23/2023)  Depression (PHQ2-9): Low Risk  (01/17/2024)  Recent Concern: Depression (PHQ2-9) - High Risk (01/14/2024)  Social Connections: Unknown (02/20/2024)  Tobacco Use: High Risk (03/15/2024)    Readmission Risk Interventions    02/26/2024    4:21 PM 02/20/2024    3:21 PM 02/02/2024   11:06 AM  Readmission Risk Prevention Plan  Transportation Screening Complete Complete Complete  Medication Review Oceanographer) Complete Complete Complete  PCP or Specialist appointment within 3-5 days of discharge Complete Complete Complete  HRI or Home Care Consult Complete Complete Complete  SW Recovery Care/Counseling Consult Not Complete Complete Complete  Palliative Care  Screening Not Applicable Not Applicable Not Applicable  Skilled Nursing Facility Not Applicable Not Applicable Not Applicable

## 2024-03-21 DIAGNOSIS — F1920 Other psychoactive substance dependence, uncomplicated: Secondary | ICD-10-CM | POA: Diagnosis not present

## 2024-03-21 DIAGNOSIS — M869 Osteomyelitis, unspecified: Secondary | ICD-10-CM | POA: Diagnosis not present

## 2024-03-21 DIAGNOSIS — F1120 Opioid dependence, uncomplicated: Secondary | ICD-10-CM | POA: Diagnosis not present

## 2024-03-21 NOTE — Plan of Care (Signed)

## 2024-03-21 NOTE — Plan of Care (Signed)
 Patient cooperative and verbalizes understanding of all instructions. No new problems identified this shift

## 2024-03-21 NOTE — Progress Notes (Signed)
 Physical Therapy Treatment Patient Details Name: Mark Hammond MRN: [id number] DOB: 11/07/1990 Today's Date: 03/21/2024   History of Present Illness 33 y/o male adm 02/19/24 with ongoing LLE pain with MSSA, cellulitis, osteomyelitis and septic arthritis. 5/23 resection of left femoral head, placement of antibiotic hip spacer (posterior approach) with I&D and VAC. PMhx: 4/30-5/16 for osteomyelitis and cellulitis of LLE w/ hip aspiration. ED visit 5/19 for OD. Alcoholic gastritis, BPD, Depression, drug abuse, ETOH abuse, hallucination, schizo affective schizophrenia.    PT Comments  Patient declined OOB mobility due to adjusting to pain regimen change and more painful today.  Did allow leg length assessment noting at least 2-3cm L leg length discrepancy due to L hip flexion contracture.  Patient in pain in supine position so did not further try to extend L hip.  Feel he could potentially benefit from heel lift though hopefully can gain ROM back in L hip over time.  PT will follow up for ambulation another day.   If plan is discharge home, recommend the following: Assist for transportation;Assistance with cooking/housework   Can travel by private Scientist, research (medical) walker (2 wheels);Wheelchair (measurements PT);Wheelchair cushion (measurements PT) (already delivered to the room)    Recommendations for Other Services       Precautions / Restrictions Precautions Precautions: Fall;Posterior Hip Other Brace: hip abd pillow in room if pt able to tolerate supine position Restrictions Weight Bearing Restrictions Per Provider Order: Yes LLE Weight Bearing Per Provider Order: Touchdown weight bearing     Mobility  Bed Mobility Overal bed mobility: Needs Assistance   Rolling: Min assist         General bed mobility comments: in supine and declined mobility this pm due to change in pain meds; but agreeable to check leg length discrepancy; pt needing A to adjust  for pelvic position for leg length assessment, though able to roll from back to L then through prone to be on R side; placed pillow between knees for comfort    Transfers                        Ambulation/Gait                   Stairs             Wheelchair Mobility     Tilt Bed    Modified Rankin (Stroke Patients Only)       Balance                                            Communication Communication Communication: No apparent difficulties  Cognition Arousal: Alert Behavior During Therapy: Flat affect   PT - Cognitive impairments: No apparent impairments                                Cueing    Exercises      General Comments General comments (skin integrity, edema, etc.): Noted L LE shorter than R about 2-3cm due to L hip flexion in flat supine position and unable to extend      Pertinent Vitals/Pain Pain Assessment Faces Pain Scale: Hurts whole lot Pain Location: LLE hip Pain Descriptors / Indicators: Grimacing, Guarding, Discomfort, Aching Pain Intervention(s): Monitored during  session, Limited activity within patient's tolerance    Home Living                          Prior Function            PT Goals (current goals can now be found in the care plan section) Progress towards PT goals: Not progressing toward goals - comment    Frequency    Min 2X/week      PT Plan      Co-evaluation              AM-PAC PT 6 Clicks Mobility   Outcome Measure  Help needed turning from your back to your side while in a flat bed without using bedrails?: A Little Help needed moving from lying on your back to sitting on the side of a flat bed without using bedrails?: None Help needed moving to and from a bed to a chair (including a wheelchair)?: None Help needed standing up from a chair using your arms (e.g., wheelchair or bedside chair)?: None Help needed to walk in hospital room?:  None Help needed climbing 3-5 steps with a railing? : A Little 6 Click Score: 22    End of Session   Activity Tolerance: Patient limited by pain Patient left: in bed   PT Visit Diagnosis: Other abnormalities of gait and mobility (R26.89);Pain Pain - Right/Left: Left Pain - part of body: Hip     Time: 1610-9604 PT Time Calculation (min) (ACUTE ONLY): 11 min  Charges:    $Therapeutic Activity: 8-22 mins PT General Charges $$ ACUTE PT VISIT: 1 Visit                     Abigail Hoff, PT Acute Rehabilitation Services [phone] 03/21/2024    Marley Simmers 03/21/2024, 5:25 PM

## 2024-03-21 NOTE — Progress Notes (Signed)
 Progress Note    Mark Hammond   ZOX:[id number]  DOB: 1991-01-19  DOA: 02/19/2024     30 PCP: Patient, No Pcp Per  Initial CC: Left hip pain  Hospital Course: 33 year old M polysubstance use/IVDU was recently discharged on 5/16 after treatment for septic left hip arthritis.  Patient was readmitted with cellulitis of the left foot ankle.  Patient underwent left hip arthroplasty for chronic septic arthritis and osteomyelitis with collapse of femoral head with failed IR drainage and outpatient antibiotic treatment.     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 Wound vac removed by ortho. Initial recommendation for 2-4 weeks of IV cefazolin  with transition to p.o. upon discharge.   - Repeat echo 6/10 unremarkable. ID follow up advised IV cefazolin  till 6/18 and then switch to oral cefadroxil  1gm bid for several weeks with ID follow up outpatient. - [facility] being pursued; no narcotics allowed; will taper norco off - Technically medically stable for discharge once disposition confirmed   Normocytic anemia Hemoglobin stable s/p 1 unit PRBCs on 5/27.  No active bleeding appreciated.    Left foot cellulitis Antibiotics as above.   Bipolar disorder Continue Seroquel .   Acute hypoxic respiratory failure secondary to opioid overdose-resolved Resolved.  Now on room air.   Polysubstance abuse: Discontinued CIWA protocol as he has been inpatient more than 20 days. Advised to quit illicit drugs. Planning possible rehab  Interval History:  He was not happy about the Norco adjustment yesterday. Discussed he is medically stable for discharge if he chooses to leave and that [facility] will not allow narcotics thus the reason for tapering off.   Old records reviewed in assessment of this patient  DVT prophylaxis:  SCDs Start: 02/23/24 2029   Code Status:   Code  Status: Full Code  Mobility Assessment (Last 72 Hours)     Mobility Assessment     Row Name 03/21/24 0809 03/20/24 2100 03/20/24 07:56:55 03/19/24 2100 03/19/24 1700   Does patient have an order for bedrest or is patient medically unstable No - Continue assessment No - Continue assessment No - Continue assessment No - Continue assessment --   What is the highest level of mobility based on the progressive mobility assessment? Level 5 (Walks with assist in room/hall) - Balance while stepping forward/back and can walk in room with assist - Complete Level 5 (Walks with assist in room/hall) - Balance while stepping forward/back and can walk in room with assist - Complete Level 5 (Walks with assist in room/hall) - Balance while stepping forward/back and can walk in room with assist - Complete Level 5 (Walks with assist in room/hall) - Balance while stepping forward/back and can walk in room with assist - Complete Level 5 (Walks with assist in room/hall) - Balance while stepping forward/back and can walk in room with assist - Complete   Is the above level different from baseline mobility prior to current illness? Yes - Recommend PT order Yes - Recommend PT order Yes - Recommend PT order Yes - Recommend PT order --    Row Name 03/19/24 1300 03/19/24 0830 03/18/24 2100       Does patient have an order for bedrest or is patient medically unstable -- No - Continue assessment No - Continue assessment     What is the highest level of mobility based on the progressive mobility assessment? Level 4 (Walks  with assist in room) - Balance while marching in place and cannot step forward and back - Complete Level 4 (Walks with assist in room) - Balance while marching in place and cannot step forward and back - Complete Level 4 (Walks with assist in room) - Balance while marching in place and cannot step forward and back - Complete     Is the above level different from baseline mobility prior to current illness? -- Yes -  Recommend PT order Yes - Recommend PT order        Barriers to discharge: awaiting safer dispo; ?[facility] Disposition Plan:  [facility]? Status is: Inpt  Objective: Blood pressure 109/61, pulse 88, temperature 98 F (36.7 C), resp. rate 19, height 5' 3 (1.6 m), weight 59 kg, SpO2 100%.  Examination:  Physical Exam Constitutional:      General: He is not in acute distress.    Appearance: Normal appearance.  HENT:     Head: Normocephalic and atraumatic.     Mouth/Throat:     Mouth: Mucous membranes are moist.   Eyes:     Extraocular Movements: Extraocular movements intact.    Cardiovascular:     Rate and Rhythm: Normal rate and regular rhythm.  Pulmonary:     Effort: Pulmonary effort is normal. No respiratory distress.     Breath sounds: Normal breath sounds. No wheezing.  Abdominal:     General: Bowel sounds are normal. There is no distension.     Palpations: Abdomen is soft.     Tenderness: There is no abdominal tenderness.   Musculoskeletal:        General: Normal range of motion.     Cervical back: Normal range of motion and neck supple.   Skin:    General: Skin is warm and dry.   Neurological:     General: No focal deficit present.     Mental Status: He is alert.   Psychiatric:        Mood and Affect: Mood normal.        Behavior: Behavior normal.      Consultants:  ID  Procedures:    Data Reviewed: No results found for this or any previous visit (from the past 24 hours).   I have reviewed pertinent nursing notes, vitals, labs, and images as necessary. I have ordered labwork to follow up on as indicated.  I have reviewed the last notes from staff over past 24 hours. I have discussed patient's care plan and test results with nursing staff, CM/SW, and other staff as appropriate.  Time spent: Greater than 50% of the 55 minute visit was spent in counseling/coordination of care for the patient as laid out in the A&P.   LOS: 30 days   Faith Homes, MD [facility] 03/21/2024, 2:32 PM

## 2024-03-22 DIAGNOSIS — F1920 Other psychoactive substance dependence, uncomplicated: Secondary | ICD-10-CM | POA: Diagnosis not present

## 2024-03-22 DIAGNOSIS — F1120 Opioid dependence, uncomplicated: Secondary | ICD-10-CM | POA: Diagnosis not present

## 2024-03-22 DIAGNOSIS — M869 Osteomyelitis, unspecified: Secondary | ICD-10-CM | POA: Diagnosis not present

## 2024-03-22 NOTE — Plan of Care (Signed)

## 2024-03-22 NOTE — Plan of Care (Signed)
 Patient progressing. Has had pain med this shift x1 per new order noted for BID pain medication to try to wean him off so that he can go to rehab without any problems.  Patient calm and able to follow commands and make needs and wants know. No problems indicated per patient.

## 2024-03-22 NOTE — Progress Notes (Signed)
 Patient given pm medications and checked to ensure he swallowed and did not pocket any.  Patient clear for meds this pm.  Ongoing assessment as needed.

## 2024-03-22 NOTE — Progress Notes (Signed)
 I just went in to meet pt, and pt grabbed a Seroquel  tablet from his bedside table drawer and quickly took it. I went through the pt's drawer, and found 5 other Seroquel  tablets that pt had been stashing. Pt also had an empty bottle of Vodka in his bedside drawer. I informed the charge RN and paged Dr. Gladstone Lamer.

## 2024-03-22 NOTE — Progress Notes (Signed)
 Progress Note    Mark Hammond   ZOX:[id number]  DOB: [birth year]  DOA: 02/19/2024     31 PCP: Patient, No Pcp Per  Initial CC: Left hip pain  Hospital Course: 33 year old M polysubstance use/IVDU was recently discharged on 5/16 after treatment for septic left hip arthritis.  Patient was readmitted with cellulitis of the left foot ankle.  Patient underwent left hip arthroplasty for chronic septic arthritis and osteomyelitis with collapse of femoral head with failed IR drainage and outpatient antibiotic treatment.     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 Wound vac removed by ortho. Initial recommendation for 2-4 weeks of IV cefazolin  with transition to p.o. upon discharge.   - Repeat echo 6/10 unremarkable. ID follow up advised IV cefazolin  till 6/18 and then switch to oral cefadroxil  1gm bid for several weeks with ID follow up outpatient. - [facility] being pursued; no narcotics allowed; will taper norco off - Technically medically stable for discharge once disposition confirmed   Normocytic anemia Hemoglobin stable s/p 1 unit PRBCs on 5/27.  No active bleeding appreciated.    Left foot cellulitis Antibiotics as above.   Bipolar disorder Continue Seroquel .   Acute hypoxic respiratory failure secondary to opioid overdose-resolved Resolved.  Now on room air.   Polysubstance abuse: Discontinued CIWA protocol as he has been inpatient more than 20 days. Advised to quit illicit drugs. Planning possible rehab  Interval History:  Seroquel  pills identified by pharmacy and found in his drawer this morning by staff.  He says he does not like to be oversedated.  Currently they are only prescribed at nighttime. Also empty bottle of vodka found.  Security searched room as well.  Patient states this bottle was from early in admission.  If any further  suspicious activity, we will need to limit visitors and might consider discharging him altogether.   Old records reviewed in assessment of this patient  DVT prophylaxis:  SCDs Start: 02/23/24 2029   Code Status:   Code Status: Full Code  Mobility Assessment (Last 72 Hours)     Mobility Assessment     Row Name 03/21/24 2130 03/21/24 1723 03/21/24 0809 03/20/24 2100 03/20/24 07:56:55   Does patient have an order for bedrest or is patient medically unstable No - Continue assessment -- No - Continue assessment No - Continue assessment No - Continue assessment   What is the highest level of mobility based on the progressive mobility assessment? Level 5 (Walks with assist in room/hall) - Balance while stepping forward/back and can walk in room with assist - Complete Level 1 (Bedfast) - Unable to balance while sitting on edge of bed Level 5 (Walks with assist in room/hall) - Balance while stepping forward/back and can walk in room with assist - Complete Level 5 (Walks with assist in room/hall) - Balance while stepping forward/back and can walk in room with assist - Complete Level 5 (Walks with assist in room/hall) - Balance while stepping forward/back and can walk in room with assist - Complete   Is the above level different from baseline mobility prior to current illness? Yes - Recommend PT order -- Yes - Recommend PT order Yes - Recommend PT order Yes - Recommend PT order    Row Name 03/19/24 2100 03/19/24 1700         Does patient have an order for bedrest or is patient medically  unstable No - Continue assessment --      What is the highest level of mobility based on the progressive mobility assessment? Level 5 (Walks with assist in room/hall) - Balance while stepping forward/back and can walk in room with assist - Complete Level 5 (Walks with assist in room/hall) - Balance while stepping forward/back and can walk in room with assist - Complete      Is the above level different from baseline  mobility prior to current illness? Yes - Recommend PT order --         Barriers to discharge: awaiting safer dispo; ?[facility] Disposition Plan:  [facility]? Status is: Inpt  Objective: Blood pressure (!) 102/57, pulse 87, temperature 97.9 F (36.6 C), resp. rate 18, height 5' 3 (1.6 m), weight 59 kg, SpO2 100%.  Examination:  Physical Exam Constitutional:      General: He is not in acute distress.    Appearance: Normal appearance.  HENT:     Head: Normocephalic and atraumatic.     Mouth/Throat:     Mouth: Mucous membranes are moist.   Eyes:     Extraocular Movements: Extraocular movements intact.    Cardiovascular:     Rate and Rhythm: Normal rate and regular rhythm.  Pulmonary:     Effort: Pulmonary effort is normal. No respiratory distress.     Breath sounds: Normal breath sounds. No wheezing.  Abdominal:     General: Bowel sounds are normal. There is no distension.     Palpations: Abdomen is soft.     Tenderness: There is no abdominal tenderness.   Musculoskeletal:        General: Normal range of motion.     Cervical back: Normal range of motion and neck supple.   Skin:    General: Skin is warm and dry.   Neurological:     General: No focal deficit present.     Mental Status: He is alert.   Psychiatric:        Mood and Affect: Mood normal.        Behavior: Behavior normal.      Consultants:  ID  Procedures:    Data Reviewed: No results found for this or any previous visit (from the past 24 hours).   I have reviewed pertinent nursing notes, vitals, labs, and images as necessary. I have ordered labwork to follow up on as indicated.  I have reviewed the last notes from staff over past 24 hours. I have discussed patient's care plan and test results with nursing staff, CM/SW, and other staff as appropriate.    LOS: 31 days   Faith Homes, MD [facility] 03/22/2024, 2:54 PM

## 2024-03-23 DIAGNOSIS — M869 Osteomyelitis, unspecified: Secondary | ICD-10-CM | POA: Diagnosis not present

## 2024-03-23 NOTE — Plan of Care (Signed)
 Patient monitored during med pass to ensure he is not pocketing his medications in mouth. Patient cooperative at this shift.  Was ambulating in hall to come to nurses station to get and icee popsicle.  Patient reported he was going to be discharged on Wednesday.

## 2024-03-23 NOTE — Plan of Care (Signed)

## 2024-03-23 NOTE — Progress Notes (Signed)
 Progress Note    Mark Hammond   FMW:[id number]  DOB: 25-Jul-1991  DOA: 02/19/2024     32 PCP: Patient, No Pcp Per  Initial CC: Left hip pain  Hospital Course: 33 year old M polysubstance use/IVDU was recently discharged on 5/16 after treatment for septic left hip arthritis.  Patient was readmitted with cellulitis of the left foot ankle.  Patient underwent left hip arthroplasty for chronic septic arthritis and osteomyelitis with collapse of femoral head with failed IR drainage and outpatient antibiotic treatment.     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 Wound vac removed by ortho. Initial recommendation for 2-4 weeks of IV cefazolin  with transition to p.o. upon discharge.   - Repeat echo 6/10 unremarkable. ID follow up advised IV cefazolin  till 6/18 and then switch to oral cefadroxil  1gm bid for several weeks with ID follow up outpatient. - [facility] being pursued; no narcotics allowed; will taper norco off - Technically medically stable for discharge once disposition confirmed   Normocytic anemia Hemoglobin stable s/p 1 unit PRBCs on 5/27.  No active bleeding appreciated.    Left foot cellulitis Antibiotics as above.   Bipolar disorder Continue Seroquel .   Acute hypoxic respiratory failure secondary to opioid overdose-resolved Resolved.  Now on room air.   Polysubstance abuse: Discontinued CIWA protocol as he has been inpatient more than 20 days. Advised to quit illicit drugs. Planning possible rehab  Interval History:  No issues overnight or since yesterday. He is still planning on staying in the hospital and going to [facility] next week   Old records reviewed in assessment of this patient  DVT prophylaxis:  SCDs Start: 02/23/24 2029   Code Status:   Code Status: Full Code  Mobility Assessment (Last 72 Hours)     Mobility Assessment      Row Name 03/23/24 0840 03/23/24 0100 03/22/24 1631 03/22/24 1100 03/21/24 2130   Does patient have an order for bedrest or is patient medically unstable No - Continue assessment No - Continue assessment No - Continue assessment No - Continue assessment No - Continue assessment   What is the highest level of mobility based on the progressive mobility assessment? Level 5 (Walks with assist in room/hall) - Balance while stepping forward/back and can walk in room with assist - Complete Level 6 (Walks independently in room and hall) - Balance while walking in room without assist - Complete Level 5 (Walks with assist in room/hall) - Balance while stepping forward/back and can walk in room with assist - Complete Level 5 (Walks with assist in room/hall) - Balance while stepping forward/back and can walk in room with assist - Complete Level 5 (Walks with assist in room/hall) - Balance while stepping forward/back and can walk in room with assist - Complete   Is the above level different from baseline mobility prior to current illness? Yes - Recommend PT order No - Consider discontinuing PT/OT Yes - Recommend PT order Yes - Recommend PT order Yes - Recommend PT order    Row Name 03/21/24 1723 03/21/24 0809 03/20/24 2100       Does patient have an order for bedrest or is patient medically unstable -- No - Continue assessment No - Continue assessment     What is the highest level of mobility based on the progressive mobility assessment? Level 1 (Bedfast) - Unable to balance while sitting on edge of  bed Level 5 (Walks with assist in room/hall) - Balance while stepping forward/back and can walk in room with assist - Complete Level 5 (Walks with assist in room/hall) - Balance while stepping forward/back and can walk in room with assist - Complete     Is the above level different from baseline mobility prior to current illness? -- Yes - Recommend PT order Yes - Recommend PT order        Barriers to discharge:  awaiting safer dispo; ?[facility] Disposition Plan:  [facility]? Status is: Inpt  Objective: Blood pressure 116/66, pulse 86, temperature 98.1 F (36.7 C), resp. rate 17, height 5' 3 (1.6 m), weight 59 kg, SpO2 98%.  Examination:  Physical Exam Constitutional:      General: He is not in acute distress.    Appearance: Normal appearance.  HENT:     Head: Normocephalic and atraumatic.     Mouth/Throat:     Mouth: Mucous membranes are moist.   Eyes:     Extraocular Movements: Extraocular movements intact.    Cardiovascular:     Rate and Rhythm: Normal rate and regular rhythm.  Pulmonary:     Effort: Pulmonary effort is normal. No respiratory distress.     Breath sounds: Normal breath sounds. No wheezing.  Abdominal:     General: Bowel sounds are normal. There is no distension.     Palpations: Abdomen is soft.     Tenderness: There is no abdominal tenderness.   Musculoskeletal:        General: Normal range of motion.     Cervical back: Normal range of motion and neck supple.   Skin:    General: Skin is warm and dry.   Neurological:     General: No focal deficit present.     Mental Status: He is alert.   Psychiatric:        Mood and Affect: Mood normal.        Behavior: Behavior normal.      Consultants:  ID  Procedures:    Data Reviewed: No results found for this or any previous visit (from the past 24 hours).   I have reviewed pertinent nursing notes, vitals, labs, and images as necessary. I have ordered labwork to follow up on as indicated.  I have reviewed the last notes from staff over past 24 hours. I have discussed patient's care plan and test results with nursing staff, CM/SW, and other staff as appropriate.    LOS: 32 days   Alm Apo, MD [facility] 03/23/2024, 1:45 PM

## 2024-03-23 NOTE — Progress Notes (Signed)
 Pt's friend came to visit.  Brought a small birthday cake and balloons.  I asked pt if he is ok with me sharing pt. Information and pt said yes, that's my uncle.  Yes, it's ok to share information with him.

## 2024-03-24 DIAGNOSIS — M869 Osteomyelitis, unspecified: Secondary | ICD-10-CM | POA: Diagnosis not present

## 2024-03-24 NOTE — Progress Notes (Signed)
 Pt's Mark Hammond, Ozell Bihari called stating pt called him and said he was being discharged 6/23 to go to an inpatient substance abuse facility.  However the previous progress notes state, while pt is technically medically stable, d/c is pending disposition/bed availability.   Pt's Uncle expressed concerned pt will attempt to leave [facility] tomorrow. Pt's Uncle stated he will call tomorrow to obtain more information regarding d/c disposition.

## 2024-03-24 NOTE — Plan of Care (Signed)

## 2024-03-24 NOTE — Progress Notes (Signed)
 Progress Note    Mark Hammond   FMW:[id number]  DOB: 13-Mar-1991  DOA: 02/19/2024     33 PCP: Patient, No Pcp Per  Initial CC: Left hip pain  Hospital Course: 33 year old M polysubstance use/IVDU was recently discharged on 5/16 after treatment for septic left hip arthritis.  Patient was readmitted with cellulitis of the left foot ankle.  Patient underwent left hip arthroplasty for chronic septic arthritis and osteomyelitis with collapse of femoral head with failed IR drainage and outpatient antibiotic treatment.     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 Wound vac removed by ortho. Initial recommendation for 2-4 weeks of IV cefazolin  with transition to p.o. upon discharge.   - Repeat echo 6/10 unremarkable. ID follow up advised IV cefazolin  till 6/18 and then switch to oral cefadroxil  1gm bid for several weeks with ID follow up outpatient. - Has outpatient follow-up appointment with ID scheduled for 04/15/2024 with Dr. Luiz - [facility] being pursued; no narcotics allowed; will taper norco off - Technically medically stable for discharge once disposition confirmed   Normocytic anemia Hemoglobin stable s/p 1 unit PRBCs on 5/27.  No active bleeding appreciated.    Left foot cellulitis Antibiotics as above.   Bipolar disorder Continue Seroquel .   Acute hypoxic respiratory failure secondary to opioid overdose-resolved Resolved.  Now on room air.   Polysubstance abuse: Discontinued CIWA protocol as he has been inpatient more than 20 days. Advised to quit illicit drugs. Planning possible rehab  Interval History:  No issues overnight or since yesterday. Telling me he wants to be discharged on Monday as he has arranged for a different rehab in [place].   Old records reviewed in assessment of this patient  DVT prophylaxis:  SCDs Start: 02/23/24  2029   Code Status:   Code Status: Full Code  Mobility Assessment (Last 72 Hours)     Mobility Assessment     Row Name 03/24/24 9287 03/23/24 2028 03/23/24 1928 03/23/24 0840 03/23/24 0100   Does patient have an order for bedrest or is patient medically unstable No - Continue assessment No - Continue assessment No - Continue assessment No - Continue assessment No - Continue assessment   What is the highest level of mobility based on the progressive mobility assessment? Level 5 (Walks with assist in room/hall) - Balance while stepping forward/back and can walk in room with assist - Complete Level 5 (Walks with assist in room/hall) - Balance while stepping forward/back and can walk in room with assist - Complete Level 5 (Walks with assist in room/hall) - Balance while stepping forward/back and can walk in room with assist - Complete Level 5 (Walks with assist in room/hall) - Balance while stepping forward/back and can walk in room with assist - Complete Level 6 (Walks independently in room and hall) - Balance while walking in room without assist - Complete   Is the above level different from baseline mobility prior to current illness? Yes - Recommend PT order Yes - Recommend PT order Yes - Recommend PT order Yes - Recommend PT order No - Consider discontinuing PT/OT    Row Name 03/22/24 1631 03/22/24 1100 03/21/24 2130 03/21/24 1723     Does patient have an order for bedrest or is patient medically unstable No - Continue assessment No - Continue assessment No - Continue assessment --    What is the highest level  of mobility based on the progressive mobility assessment? Level 5 (Walks with assist in room/hall) - Balance while stepping forward/back and can walk in room with assist - Complete Level 5 (Walks with assist in room/hall) - Balance while stepping forward/back and can walk in room with assist - Complete Level 5 (Walks with assist in room/hall) - Balance while stepping forward/back and can walk  in room with assist - Complete Level 1 (Bedfast) - Unable to balance while sitting on edge of bed    Is the above level different from baseline mobility prior to current illness? Yes - Recommend PT order Yes - Recommend PT order Yes - Recommend PT order --       Barriers to discharge: awaiting safer dispo; ?[facility] Disposition Plan:  [facility]? Status is: Inpt  Objective: Blood pressure 104/62, pulse 94, temperature 98.1 F (36.7 C), resp. rate 18, height 5' 3 (1.6 m), weight 59 kg, SpO2 100%.  Examination:  Physical Exam Constitutional:      General: He is not in acute distress.    Appearance: Normal appearance.  HENT:     Head: Normocephalic and atraumatic.     Mouth/Throat:     Mouth: Mucous membranes are moist.   Eyes:     Extraocular Movements: Extraocular movements intact.    Cardiovascular:     Rate and Rhythm: Normal rate and regular rhythm.  Pulmonary:     Effort: Pulmonary effort is normal. No respiratory distress.     Breath sounds: Normal breath sounds. No wheezing.  Abdominal:     General: Bowel sounds are normal. There is no distension.     Palpations: Abdomen is soft.     Tenderness: There is no abdominal tenderness.   Musculoskeletal:        General: Normal range of motion.     Cervical back: Normal range of motion and neck supple.   Skin:    General: Skin is warm and dry.   Neurological:     General: No focal deficit present.     Mental Status: He is alert.   Psychiatric:        Mood and Affect: Mood normal.        Behavior: Behavior normal.      Consultants:  ID  Procedures:    Data Reviewed: No results found for this or any previous visit (from the past 24 hours).   I have reviewed pertinent nursing notes, vitals, labs, and images as necessary. I have ordered labwork to follow up on as indicated.  I have reviewed the last notes from staff over past 24 hours. I have discussed patient's care plan and test results with nursing staff, CM/SW,  and other staff as appropriate.    LOS: 33 days   Alm Apo, MD [facility] 03/24/2024, 12:32 PM

## 2024-03-25 ENCOUNTER — Other Ambulatory Visit (HOSPITAL_COMMUNITY): Payer: Self-pay

## 2024-03-25 DIAGNOSIS — F1920 Other psychoactive substance dependence, uncomplicated: Secondary | ICD-10-CM | POA: Diagnosis not present

## 2024-03-25 DIAGNOSIS — F1120 Opioid dependence, uncomplicated: Secondary | ICD-10-CM | POA: Diagnosis not present

## 2024-03-25 DIAGNOSIS — M869 Osteomyelitis, unspecified: Secondary | ICD-10-CM | POA: Diagnosis not present

## 2024-03-25 MED ORDER — ACETAMINOPHEN 500 MG PO TABS
1000.0000 mg | ORAL_TABLET | Freq: Four times a day (QID) | ORAL | Status: DC | PRN
Start: 2024-03-25 — End: 2024-03-28

## 2024-03-25 MED ORDER — QUETIAPINE FUMARATE 50 MG PO TABS
50.0000 mg | ORAL_TABLET | Freq: Every day | ORAL | 1 refills | Status: DC
  Filled 2024-03-25: qty 30, 30d supply, fill #0

## 2024-03-25 MED ORDER — ASPIRIN 81 MG PO TBEC
81.0000 mg | DELAYED_RELEASE_TABLET | Freq: Every day | ORAL | Status: DC
Start: 2024-03-25 — End: 2024-03-28

## 2024-03-25 MED ORDER — CEFADROXIL 500 MG PO CAPS
1000.0000 mg | ORAL_CAPSULE | Freq: Two times a day (BID) | ORAL | 0 refills | Status: DC
  Filled 2024-03-25: qty 100, 25d supply, fill #0

## 2024-03-25 NOTE — Progress Notes (Signed)
 PT Cancellation Note  Patient Details Name: Mark Hammond MRN: [id number] DOB: [birth year]   Cancelled Treatment:    Reason Eval/Treat Not Completed: Patient declined, no reason specified; reports planning to d/c today.  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  Agreed for information on that so printed address and phone information for [facility] and educated him to call for heel lift evaluation when desired.  Pt voiced understanding.    Montie Portal 03/25/2024, 11:35 AM Micheline Portal, PT Acute Rehabilitation Services Office:[phone] 03/25/2024

## 2024-03-25 NOTE — TOC Transition Note (Addendum)
 Transition of Care [facility]) - Discharge Note   Patient Details  Name: Mark Hammond MRN: [id number] Date of Birth: 03-13-1991  [facility] CM/SW Contact:  Esteven Overfelt A [place], LCSW Phone Number: 03/25/2024, 4:38 PM   Clinical Narrative:     CSW met with pt at bedside. CSW asked regarding follow up for [facility] per previous plan for discharge. He said that he's found is own way and declined services for follow up. Pt was upset that he had to change his medication plan per guidelines are the rehab facility.  He said that his family member would be picking him up and assisting him with rehab or some other placement option in [place]. He declined to tell CSW what placement or rehab follow up he had secured.   No other needs identified at this time. TOC will sign off, please consult again if TOC needs arise.    Final next level of care: Other (comment) Barriers to Discharge: Barriers Resolved   Patient Goals and CMS Choice            Discharge Placement                       Discharge Plan and Services Additional resources added to the After Visit Summary for                                       Social Drivers of Health (SDOH) Interventions SDOH Screenings   Food Insecurity: Food Insecurity Present (02/20/2024)  Housing: High Risk (02/20/2024)  Transportation Needs: Unmet Transportation Needs (02/20/2024)  Utilities: At Risk (02/20/2024)  Alcohol Screen: High Risk (02/23/2023)  Depression (PHQ2-9): Low Risk  (01/17/2024)  Recent Concern: Depression (PHQ2-9) - High Risk (01/14/2024)  Social Connections: Unknown (02/20/2024)  Tobacco Use: High Risk (03/15/2024)     Readmission Risk Interventions    02/26/2024    4:21 PM 02/20/2024    3:21 PM 02/02/2024   11:06 AM  Readmission Risk Prevention Plan  Transportation Screening Complete Complete Complete  Medication Review Oceanographer) Complete Complete Complete  PCP or Specialist appointment within 3-5 days of  discharge Complete Complete Complete  HRI or Home Care Consult Complete Complete Complete  SW Recovery Care/Counseling Consult Not Complete Complete Complete  Palliative Care Screening Not Applicable Not Applicable Not Applicable  Skilled Nursing Facility Not Applicable Not Applicable Not Applicable

## 2024-03-25 NOTE — Plan of Care (Signed)

## 2024-03-25 NOTE — Discharge Summary (Signed)
 Physician Discharge Summary   Mark Hammond FMW:[id number] DOB: 1991/04/25 DOA: 02/19/2024  PCP: Patient, No Pcp Per  Admit date: 02/19/2024 Discharge date: 03/25/2024  Admitted From: Home Disposition:  Home; supposedly going to rehab in [place] Discharging physician: Alm Apo, MD Barriers to discharge: awaiting rehab or patient to figure out d/c plans  Recommendations at discharge: Follow up with ID 7/14; patient aware of appt   Discharge Condition: stable CODE STATUS: Full  Diet recommendation:  Diet Orders (From admission, onward)     Start     Ordered   03/25/24 0000  Diet general        03/25/24 1053   02/23/24 2029  Diet regular Room service appropriate? Yes; Fluid consistency: Thin  Diet effective now       Question Answer Comment  Room service appropriate? Yes   Fluid consistency: Thin      02/23/24 2028            Hospital Course: 33 year old M polysubstance use/IVDU was recently discharged on 5/16 after treatment for septic left hip arthritis.  Patient was readmitted with cellulitis of the left foot ankle.  Patient underwent left hip arthroplasty for chronic septic arthritis and osteomyelitis with collapse of femoral head with failed IR drainage and outpatient antibiotic treatment.     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 Wound vac removed by ortho. Initial recommendation for 2-4 weeks of IV cefazolin  with transition to p.o. upon discharge.   - Repeat echo 6/10 unremarkable. ID follow up advised IV cefazolin  till 6/18 and then switch to oral cefadroxil  1gm bid for several weeks with ID follow up outpatient. - Has outpatient follow-up appointment with ID scheduled for 04/15/2024 with Dr. Luiz - [facility] being pursued but he changed his mind and now going to a place in [place] he says; no narcotics allowed; will taper  norco off - Technically medically stable for discharge   Normocytic anemia Hemoglobin stable s/p 1 unit PRBCs on 5/27.  No active bleeding appreciated.    Left foot cellulitis Antibiotics as above.   Bipolar disorder Continue Seroquel .   Acute hypoxic respiratory failure secondary to opioid overdose-resolved Resolved.  Now on room air.   Polysubstance abuse: Discontinued CIWA protocol as he has been inpatient more than 20 days. Advised to quit illicit drugs. Planning possible rehab    The patient's acute and chronic medical conditions were treated accordingly. On day of discharge, patient was felt deemed stable for discharge. Patient/family member advised to call PCP or come back to ER if needed.   Principal Diagnosis: Osteomyelitis of left hip [facility])  Discharge Diagnoses: Active Hospital Problems   Diagnosis Date Noted   Osteomyelitis of left hip (HCC) 02/22/2024    Priority: 1.   Anemia of chronic disease 01/31/2024   Transaminitis 01/31/2024   Polysubstance (including opioids) dependence, daily use [facility])     Resolved Hospital Problems   Diagnosis Date Noted Date Resolved   Cellulitis of left lower extremity 01/31/2024 03/20/2024     Discharge Instructions     Diet general   Complete by: As directed    Increase activity slowly   Complete by: As directed    No wound care   Complete by: As directed       Allergies as of 03/25/2024       Reactions   Codeine Other (See Comments)  Patient states parents told him he was allergic at a young age.   Fish Allergy Other (See Comments)   Patient does not wish to eat ANY fish- reaction unclear   Shellfish Allergy Other (See Comments)   Patient does not wish to eat ANY shellfish- reaction unclear        Medication List     STOP taking these medications    (feeding supplement) PROSource Plus liquid   ferrous sulfate  325 (65 FE) MG tablet   folic acid  1 MG tablet Commonly known as: FOLVITE     HYDROcodone -acetaminophen  5-325 MG tablet Commonly known as: NORCO/VICODIN   hydrOXYzine  25 MG tablet Commonly known as: ATARAX    midodrine  10 MG tablet Commonly known as: PROAMATINE    naloxone  4 MG/0.1ML Liqd nasal spray kit Commonly known as: NARCAN    nystatin  powder Commonly known as: MYCOSTATIN /NYSTOP    thiamine  100 MG tablet Commonly known as: Vitamin B-1       TAKE these medications    acetaminophen  500 MG tablet Commonly known as: TYLENOL  Take 2 tablets (1,000 mg total) by mouth every 6 (six) hours as needed for mild pain (pain score 1-3) or headache. What changed: how much to take   aspirin  EC 81 MG tablet Take 1 tablet (81 mg total) by mouth daily. Swallow whole.   cefadroxil  500 MG capsule Commonly known as: DURICEF Take 2 capsules (1,000 mg total) by mouth 2 (two) times daily for 25 days.   QUEtiapine  50 MG tablet Commonly known as: SEROQUEL  Take 1 tablet (50 mg total) by mouth at bedtime.               Durable Medical Equipment  (From admission, onward)           Start     Ordered   03/20/24 1212  For home use only DME standard manual wheelchair with seat cushion  Once       Comments: Patient suffers from S/P Left hip replacement, septic arthritis of Left hip, cellulitis of left foot/ankle which impairs their ability to perform daily activities like toileting in the home.  A walker will not resolve issue with performing activities of daily living. A wheelchair will allow patient to safely perform daily activities. Patient can safely propel the wheelchair in the home or has a caregiver who can provide assistance. Length of need 12 months . Accessories: elevating leg rests (ELRs), wheel locks, extensions and anti-tippers.   03/20/24 1215   02/23/24 2029  DME Walker rolling  Once       Question:  Patient needs a walker to treat with the following condition  Answer:  History of open reduction and internal fixation (ORIF) procedure   02/23/24 2028    02/23/24 2029  DME 3 n 1  Once        02/23/24 2028   02/23/24 2029  DME Bedside commode  Once       Question:  Patient needs a bedside commode to treat with the following condition  Answer:  History of open reduction and internal fixation (ORIF) procedure   02/23/24 2028            Follow-up Information     [facility] - A Dept Of [facility] Follow up.   Specialty: Internal Medicine Why: Please call the office and schedule a hospital follow up in the next 7-10 days. Contact information: [address] [place]  [address] [phone] Additional information: [address]  [address]  [place], [place] [address]        Jule Ronal CROME, PA-C. Schedule an appointment as soon as possible for a visit in 2 week(s).   Specialty: Orthopedic Surgery Contact information: [address] [place]  [place] [address] [phone]         Care, [facility] Follow up.   Why: [employer] will provide a RW to your bedside prior to your discharge from the hospital. Contact information: [address] [place] [address] [phone]                Allergies  Allergen Reactions   Codeine Other (See Comments)    Patient states parents told him he was allergic at a young age.   Fish Allergy Other (See Comments)    Patient does not wish to eat ANY fish- reaction unclear   Shellfish Allergy Other (See Comments)    Patient does not wish to eat ANY shellfish- reaction unclear    Consultations: ID  Procedures:   Discharge Exam: BP 114/67 (BP Location: Right Arm)   Pulse 81   Temp 98.2 F (36.8 C) (Oral)   Resp 18   Ht 5' 3 (1.6 m)   Wt 59 kg   SpO2 100%   BMI 23.03 kg/m  Physical Exam Constitutional:      General: He is not in acute distress.    Appearance: Normal appearance.  HENT:     Head: Normocephalic and atraumatic.     Mouth/Throat:     Mouth: Mucous membranes are moist.   Eyes:     Extraocular  Movements: Extraocular movements intact.    Cardiovascular:     Rate and Rhythm: Normal rate and regular rhythm.  Pulmonary:     Effort: Pulmonary effort is normal. No respiratory distress.     Breath sounds: Normal breath sounds. No wheezing.  Abdominal:     General: Bowel sounds are normal. There is no distension.     Palpations: Abdomen is soft.     Tenderness: There is no abdominal tenderness.   Musculoskeletal:        General: Normal range of motion.     Cervical back: Normal range of motion and neck supple.   Skin:    General: Skin is warm and dry.   Neurological:     General: No focal deficit present.     Mental Status: He is alert.   Psychiatric:        Mood and Affect: Mood normal.        Behavior: Behavior normal.      The results of significant diagnostics from this hospitalization (including imaging, microbiology, ancillary and laboratory) are listed below for reference.   Microbiology: No results found for this or any previous visit (from the past 240 hours).   Labs: BNP (last 3 results) No results for input(s): BNP in the last 8760 hours. Basic Metabolic Panel: Recent Labs  Lab 03/20/24 0415  NA 137  K 3.8  CL 104  CO2 25  GLUCOSE 97  BUN <5*  CREATININE 0.60*  CALCIUM 8.7*   Liver Function Tests: Recent Labs  Lab 03/20/24 0415  AST 39  ALT 15  ALKPHOS 105  BILITOT 0.5  PROT 7.5  ALBUMIN  3.3*   No results for input(s): LIPASE, AMYLASE in the last 168 hours. No results for input(s): AMMONIA in the last 168 hours. CBC: Recent Labs  Lab 03/20/24 0415  WBC 3.7*  HGB 10.0*  HCT 30.9*  MCV 88.8  PLT 279   Cardiac Enzymes: No results for input(s): CKTOTAL, CKMB, CKMBINDEX, TROPONINI in the last 168 hours. BNP: Invalid input(s): POCBNP CBG: No results for input(s): GLUCAP in the last 168 hours. D-Dimer No results for input(s): DDIMER in the last 72 hours. Hgb A1c No results for input(s): HGBA1C in the last  72 hours. Lipid Profile No results for input(s): CHOL, HDL, LDLCALC, TRIG, CHOLHDL, LDLDIRECT in the last 72 hours. Thyroid  function studies No results for input(s): TSH, T4TOTAL, T3FREE, THYROIDAB in the last 72 hours.  Invalid input(s): FREET3 Anemia work up No results for input(s): VITAMINB12, FOLATE, FERRITIN, TIBC, IRON, RETICCTPCT in the last 72 hours. Urinalysis    Component Value Date/Time   COLORURINE YELLOW 02/19/2024 2127   APPEARANCEUR CLEAR 02/19/2024 2127   LABSPEC 1.009 02/19/2024 2127   PHURINE 5.0 02/19/2024 2127   GLUCOSEU NEGATIVE 02/19/2024 2127   HGBUR NEGATIVE 02/19/2024 2127   BILIRUBINUR NEGATIVE 02/19/2024 2127   BILIRUBINUR negative 09/05/2019 1004   KETONESUR NEGATIVE 02/19/2024 2127   PROTEINUR 30 (A) 02/19/2024 2127   UROBILINOGEN 0.2 09/05/2019 1004   NITRITE NEGATIVE 02/19/2024 2127   LEUKOCYTESUR NEGATIVE 02/19/2024 2127   Sepsis Labs Recent Labs  Lab 03/20/24 0415  WBC 3.7*   Microbiology No results found for this or any previous visit (from the past 240 hours).  Procedures/Studies: ECHOCARDIOGRAM COMPLETE Result Date: 03/12/2024    ECHOCARDIOGRAM REPORT   Patient Name:   Mark Hammond Date of Exam: 03/12/2024 Medical Rec #:  [id number]   Height:       63.0 in Accession #:    [id number]  Weight:       130.0 lb Date of Birth:  [birth year]   BSA:          1.610 m Patient Age:    32 years    BP:           104/63 mmHg Patient Gender: M           HR:           94 bpm. Exam Location:  Inpatient Procedure: 2D Echo, Cardiac Doppler and Color Doppler (Both Spectral and Color            Flow Doppler were utilized during procedure). Indications:    Murmur  History:        Patient has prior history of Echocardiogram examinations, most                 recent 02/05/2024. Risk Factors:Hypertension.  Sonographer:    Jayson Gaskins Referring Phys: [id number] COREAN SAILOR DIXON  Sonographer Comments: Patient rolled on right side. IMPRESSIONS  1.  Left ventricular ejection fraction, by estimation, is 60 to 65%. The left ventricle has normal function. The left ventricle has no regional wall motion abnormalities. Left ventricular diastolic parameters were normal.  2. Right ventricular systolic function is normal. The right ventricular size is normal.  3. The mitral valve is normal in structure. No evidence of mitral valve regurgitation. No evidence of mitral stenosis.  4. The aortic valve was not well visualized. Aortic valve regurgitation is not visualized. No aortic stenosis is present.  5. The inferior vena cava is normal in size with greater than 50% respiratory variability, suggesting right atrial pressure of 3 mmHg.  6. Cannot exclude a small PFO. Conclusion(s)/Recommendation(s): No evidence of valvular vegetations on this transthoracic echocardiogram. Consider a transesophageal echocardiogram to exclude infective endocarditis if clinically indicated. FINDINGS  Left Ventricle: No 3D or strain  transmitted. Left ventricular ejection fraction, by estimation, is 60 to 65%. The left ventricle has normal function. The left ventricle has no regional wall motion abnormalities. Strain was performed and the global longitudinal strain is indeterminate. The left ventricular internal cavity size was normal in size. There is no left ventricular hypertrophy. Left ventricular diastolic parameters were normal. Right Ventricle: The right ventricular size is normal. No increase in right ventricular wall thickness. Right ventricular systolic function is normal. Left Atrium: Left atrial size was normal in size. Right Atrium: Right atrial size was normal in size. Pericardium: There is no evidence of pericardial effusion. Mitral Valve: The mitral valve is normal in structure. No evidence of mitral valve regurgitation. No evidence of mitral valve stenosis. Tricuspid Valve: The tricuspid valve is normal in structure. Tricuspid valve regurgitation is not demonstrated. No evidence  of tricuspid stenosis. Aortic Valve: The aortic valve was not well visualized. Aortic valve regurgitation is not visualized. No aortic stenosis is present. Aortic valve mean gradient measures 4.0 mmHg. Aortic valve peak gradient measures 6.2 mmHg. Aortic valve area, by VTI measures 2.16 cm. Pulmonic Valve: The pulmonic valve was normal in structure. Pulmonic valve regurgitation is not visualized. No evidence of pulmonic stenosis. Aorta: The aortic root and ascending aorta are structurally normal, with no evidence of dilitation. Venous: The inferior vena cava is normal in size with greater than 50% respiratory variability, suggesting right atrial pressure of 3 mmHg. IAS/Shunts: Cannot exclude a small PFO. Additional Comments: 3D was performed not requiring image post processing on an independent workstation and was indeterminate.  LEFT VENTRICLE PLAX 2D LVIDd:         4.40 cm   Diastology LVIDs:         3.20 cm   LV e' medial:    10.00 cm/s LV PW:         0.60 cm   LV E/e' medial:  7.4 LV IVS:        0.70 cm   LV e' lateral:   17.80 cm/s LVOT diam:     1.70 cm   LV E/e' lateral: 4.2 LV SV:         35 LV SV Index:   22 LVOT Area:     2.27 cm  RIGHT VENTRICLE RV S prime:     9.90 cm/s TAPSE (M-mode): 1.6 cm LEFT ATRIUM           Index        RIGHT ATRIUM          Index LA Vol (A2C): 15.9 ml 9.87 ml/m   RA Area:     6.55 cm LA Vol (A4C): 30.1 ml 18.69 ml/m  RA Volume:   11.10 ml 6.89 ml/m  AORTIC VALVE AV Area (Vmax):    2.07 cm AV Area (Vmean):   1.83 cm AV Area (VTI):     2.16 cm AV Vmax:           124.00 cm/s AV Vmean:          94.700 cm/s AV VTI:            0.162 m AV Peak Grad:      6.2 mmHg AV Mean Grad:      4.0 mmHg LVOT Vmax:         113.00 cm/s LVOT Vmean:        76.500 cm/s LVOT VTI:          0.154 m LVOT/AV VTI ratio: 0.95 MITRAL VALVE MV  Area (PHT): 3.56 cm    SHUNTS MV Decel Time: 213 msec    Systemic VTI:  0.15 m MV E velocity: 73.90 cm/s  Systemic Diam: 1.70 cm MV A velocity: 55.00 cm/s MV E/A  ratio:  1.34 Stanly Leavens MD Electronically signed by Stanly Leavens MD Signature Date/Time: 03/12/2024/12:01:07 PM    Final      Time coordinating discharge: Over 30 minutes    Alm Apo, MD  [facility] 03/25/2024, 1:21 PM

## 2024-03-25 NOTE — Plan of Care (Signed)
  Problem: Education: Goal: Knowledge of General Education information will improve Description: Including pain rating scale, medication(s)/side effects and non-pharmacologic comfort measures Outcome: Adequate for Discharge   Problem: Health Behavior/Discharge Planning: Goal: Ability to manage health-related needs will improve Outcome: Adequate for Discharge   Problem: Clinical Measurements: Goal: Ability to maintain clinical measurements within normal limits will improve Outcome: Adequate for Discharge Goal: Will remain free from infection Outcome: Adequate for Discharge Goal: Diagnostic test results will improve Outcome: Adequate for Discharge Goal: Respiratory complications will improve Outcome: Adequate for Discharge Goal: Cardiovascular complication will be avoided Outcome: Adequate for Discharge   Problem: Activity: Goal: Risk for activity intolerance will decrease Outcome: Adequate for Discharge   Problem: Nutrition: Goal: Adequate nutrition will be maintained Outcome: Adequate for Discharge   Problem: Coping: Goal: Level of anxiety will decrease Outcome: Adequate for Discharge   Problem: Elimination: Goal: Will not experience complications related to bowel motility Outcome: Adequate for Discharge Goal: Will not experience complications related to urinary retention Outcome: Adequate for Discharge   Problem: Pain Managment: Goal: General experience of comfort will improve and/or be controlled Outcome: Adequate for Discharge   Problem: Safety: Goal: Ability to remain free from injury will improve Outcome: Adequate for Discharge   Problem: Skin Integrity: Goal: Risk for impaired skin integrity will decrease Outcome: Adequate for Discharge   Problem: Acute Rehab PT Goals(only PT should resolve) Goal: Pt Will Go Supine/Side To Sit Outcome: Adequate for Discharge Goal: Pt Will Go Sit To Supine/Side Outcome: Adequate for Discharge Goal: Patient Will Transfer  Sit To/From Stand Outcome: Adequate for Discharge Goal: Pt Will Ambulate Outcome: Adequate for Discharge Goal: PT Additional Goal #1 Outcome: Adequate for Discharge   Problem: Acute Rehab OT Goals (only OT should resolve) Goal: Pt. Will Perform Grooming Outcome: Adequate for Discharge Goal: Pt. Will Perform Upper Body Bathing Outcome: Adequate for Discharge Goal: Pt. Will Perform Lower Body Bathing Outcome: Adequate for Discharge Goal: Pt. Will Perform Upper Body Dressing Outcome: Adequate for Discharge Goal: Pt. Will Perform Lower Body Dressing Outcome: Adequate for Discharge Goal: Pt. Will Transfer To Toilet Outcome: Adequate for Discharge Goal: Pt. Will Perform Tub/Shower Transfer Outcome: Adequate for Discharge

## 2024-03-26 ENCOUNTER — Other Ambulatory Visit: Payer: Self-pay

## 2024-03-27 ENCOUNTER — Other Ambulatory Visit: Payer: Self-pay

## 2024-03-27 ENCOUNTER — Encounter (HOSPITAL_COMMUNITY): Payer: Self-pay | Admitting: Emergency Medicine

## 2024-03-27 ENCOUNTER — Emergency Department (HOSPITAL_COMMUNITY): Payer: MEDICAID

## 2024-03-27 ENCOUNTER — Emergency Department (HOSPITAL_COMMUNITY)
Admission: EM | Admit: 2024-03-27 | Discharge: 2024-03-28 | Disposition: A | Discharge status: Other Acute Inpatient Hospital | Payer: MEDICAID | Attending: Emergency Medicine | Admitting: Emergency Medicine

## 2024-03-27 ENCOUNTER — Emergency Department (HOSPITAL_COMMUNITY)
Admission: EM | Admit: 2024-03-27 | Discharge: 2024-03-27 | Disposition: A | Discharge status: Home / Self Care | Payer: MEDICAID | Attending: Emergency Medicine | Admitting: Emergency Medicine

## 2024-03-27 DIAGNOSIS — E876 Hypokalemia: Secondary | ICD-10-CM | POA: Diagnosis not present

## 2024-03-27 DIAGNOSIS — Z760 Encounter for issue of repeat prescription: Secondary | ICD-10-CM | POA: Insufficient documentation

## 2024-03-27 DIAGNOSIS — Y902 Blood alcohol level of 40-59 mg/100 ml: Secondary | ICD-10-CM | POA: Diagnosis not present

## 2024-03-27 DIAGNOSIS — Z7982 Long term (current) use of aspirin: Secondary | ICD-10-CM | POA: Insufficient documentation

## 2024-03-27 DIAGNOSIS — R1084 Generalized abdominal pain: Secondary | ICD-10-CM | POA: Insufficient documentation

## 2024-03-27 DIAGNOSIS — R112 Nausea with vomiting, unspecified: Secondary | ICD-10-CM | POA: Insufficient documentation

## 2024-03-27 DIAGNOSIS — T401X1A Poisoning by heroin, accidental (unintentional), initial encounter: Secondary | ICD-10-CM | POA: Diagnosis present

## 2024-03-27 LAB — CBC WITH DIFFERENTIAL/PLATELET
Abs Immature Granulocytes: 0.03 10*3/uL (ref 0.00–0.07)
Basophils Absolute: 0.1 10*3/uL (ref 0.0–0.1)
Basophils Relative: 1 %
Eosinophils Absolute: 0 10*3/uL (ref 0.0–0.5)
Eosinophils Relative: 0 %
HCT: 34.2 % — ABNORMAL LOW (ref 39.0–52.0)
Hemoglobin: 10.9 g/dL — ABNORMAL LOW (ref 13.0–17.0)
Immature Granulocytes: 0 %
Lymphocytes Relative: 20 %
Lymphs Abs: 1.6 10*3/uL (ref 0.7–4.0)
MCH: 28.5 pg (ref 26.0–34.0)
MCHC: 31.9 g/dL (ref 30.0–36.0)
MCV: 89.3 fL (ref 80.0–100.0)
Monocytes Absolute: 0.5 10*3/uL (ref 0.1–1.0)
Monocytes Relative: 6 %
Neutro Abs: 5.8 10*3/uL (ref 1.7–7.7)
Neutrophils Relative %: 73 %
Platelets: 318 10*3/uL (ref 150–400)
RBC: 3.83 MIL/uL — ABNORMAL LOW (ref 4.22–5.81)
RDW: 13.2 % (ref 11.5–15.5)
WBC: 8 10*3/uL (ref 4.0–10.5)
nRBC: 0 % (ref 0.0–0.2)

## 2024-03-27 LAB — RAPID URINE DRUG SCREEN, HOSP PERFORMED
Amphetamines: NOT DETECTED
Barbiturates: NOT DETECTED
Benzodiazepines: POSITIVE — AB
Cocaine: POSITIVE — AB
Opiates: POSITIVE — AB
Tetrahydrocannabinol: NOT DETECTED

## 2024-03-27 LAB — COMPREHENSIVE METABOLIC PANEL WITH GFR
ALT: 23 U/L (ref 0–44)
ALT: 22 U/L (ref 0–44)
AST: 46 U/L — ABNORMAL HIGH (ref 15–41)
AST: 43 U/L — ABNORMAL HIGH (ref 15–41)
Albumin: 4 g/dL (ref 3.5–5.0)
Albumin: 4.1 g/dL (ref 3.5–5.0)
Alkaline Phosphatase: 114 U/L (ref 38–126)
Alkaline Phosphatase: 122 U/L (ref 38–126)
Anion gap: 14 (ref 5–15)
Anion gap: 12 (ref 5–15)
BUN: 9 mg/dL (ref 6–20)
BUN: <5 mg/dL — ABNORMAL LOW (ref 6–20)
CO2: 20 mmol/L — ABNORMAL LOW (ref 22–32)
CO2: 24 mmol/L (ref 22–32)
Calcium: 9 mg/dL (ref 8.9–10.3)
Calcium: 8.5 mg/dL — ABNORMAL LOW (ref 8.9–10.3)
Chloride: 101 mmol/L (ref 98–111)
Chloride: 101 mmol/L (ref 98–111)
Creatinine, Ser: 0.84 mg/dL (ref 0.61–1.24)
Creatinine, Ser: 0.62 mg/dL (ref 0.61–1.24)
GFR, Estimated: >60 mL/min (ref >60)
GFR, Estimated: >60 mL/min (ref >60)
Glucose, Bld: 161 mg/dL — ABNORMAL HIGH (ref 70–99)
Glucose, Bld: 114 mg/dL — ABNORMAL HIGH (ref 70–99)
Potassium: 3.4 mmol/L — ABNORMAL LOW (ref 3.5–5.1)
Potassium: 3.2 mmol/L — ABNORMAL LOW (ref 3.5–5.1)
Sodium: 135 mmol/L (ref 135–145)
Sodium: 137 mmol/L (ref 135–145)
Total Bilirubin: 0.4 mg/dL (ref 0.0–1.2)
Total Bilirubin: 0.7 mg/dL (ref 0.0–1.2)
Total Protein: 8.6 g/dL — ABNORMAL HIGH (ref 6.5–8.1)
Total Protein: 8.6 g/dL — ABNORMAL HIGH (ref 6.5–8.1)

## 2024-03-27 LAB — ETHANOL
Alcohol, Ethyl (B): 48 mg/dL — ABNORMAL HIGH (ref <15)
Alcohol, Ethyl (B): 146 mg/dL — ABNORMAL HIGH (ref <15)

## 2024-03-27 LAB — ACETAMINOPHEN LEVEL
Acetaminophen (Tylenol), Serum: <10 ug/mL — ABNORMAL LOW (ref 10–30)
Acetaminophen (Tylenol), Serum: <10 ug/mL — ABNORMAL LOW (ref 10–30)

## 2024-03-27 LAB — CBC
HCT: 35 % — ABNORMAL LOW (ref 39.0–52.0)
Hemoglobin: 11 g/dL — ABNORMAL LOW (ref 13.0–17.0)
MCH: 27.8 pg (ref 26.0–34.0)
MCHC: 31.4 g/dL (ref 30.0–36.0)
MCV: 88.6 fL (ref 80.0–100.0)
Platelets: 346 10*3/uL (ref 150–400)
RBC: 3.95 MIL/uL — ABNORMAL LOW (ref 4.22–5.81)
RDW: 13.2 % (ref 11.5–15.5)
WBC: 8.9 10*3/uL (ref 4.0–10.5)
nRBC: 0 % (ref 0.0–0.2)

## 2024-03-27 LAB — SALICYLATE LEVEL
Salicylate Lvl: <7 mg/dL — ABNORMAL LOW (ref 7.0–30.0)
Salicylate Lvl: <7 mg/dL — ABNORMAL LOW (ref 7.0–30.0)

## 2024-03-27 LAB — LIPASE, BLOOD: Lipase: 27 U/L (ref 11–51)

## 2024-03-27 LAB — CBG MONITORING, ED: Glucose-Capillary: 160 mg/dL — ABNORMAL HIGH (ref 70–99)

## 2024-03-27 MED ORDER — IOHEXOL 350 MG/ML SOLN
75.0000 mL | Freq: Once | INTRAVENOUS | Status: AC | PRN
  Administered 2024-03-27: 75 mL via INTRAVENOUS

## 2024-03-27 MED ORDER — FAMOTIDINE 20 MG PO TABS: 20.0000 mg | ORAL_TABLET | Freq: Two times a day (BID) | ORAL | 0 refills | Status: DC

## 2024-03-27 MED ORDER — PANTOPRAZOLE SODIUM 40 MG IV SOLR
40.0000 mg | Freq: Once | INTRAVENOUS | Status: AC
  Administered 2024-03-27: 40 mg via INTRAVENOUS
  Filled 2024-03-27: qty 10

## 2024-03-27 MED ORDER — SODIUM CHLORIDE 0.9 % IV BOLUS
1000.0000 mL | Freq: Once | INTRAVENOUS | Status: AC
  Administered 2024-03-27: 1000 mL via INTRAVENOUS

## 2024-03-27 MED ORDER — ONDANSETRON 4 MG PO TBDP
4.0000 mg | ORAL_TABLET | Freq: Once | ORAL | Status: AC
  Administered 2024-03-27: 4 mg via ORAL
  Filled 2024-03-27: qty 1

## 2024-03-27 NOTE — ED Notes (Signed)
 All patient belonging has been returned to patient.
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
## 2024-03-27 NOTE — ED Notes (Signed)
 Patient state that he is having SI and wants to go to [facility].

## 2024-03-27 NOTE — ED Notes (Signed)
 EDP to bedside for MSE.
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
## 2024-03-27 NOTE — ED Notes (Signed)
 Pt dressed into hospital appropriate scrubs by this RN and belongings placed into 1 bag.  Contents include: 1 black pants, 1 black t shirt, 1 pair flip flops, 2 bottle sodas, 1 bottle water .

## 2024-03-27 NOTE — ED Notes (Signed)
 Patient verbalizes thoughts of SI, however patient would also like to sign himself out to go to [facility] for help, patient stated  I don't want to be stuck here for 3-4 days EDP notified.
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
## 2024-03-27 NOTE — Discharge Instructions (Signed)
 I would like for you to follow-up with [facility] today.  You may return to the emergency department for any worsening symptoms.

## 2024-03-27 NOTE — ED Triage Notes (Signed)
 Pt to ED via [facility] c/o generalized abd pain x2 hours, n/v x6.  Pt also states SI and auditory hallucinations without HI or visual hallucinations.  States plan to overdose on seroquel  and ETOH.  Pt admits to large amounts of ETOH use today, using cocaine  and heroine, and taking tylenol  with codeine around 1930.  EMS vitals 130/70 BP, 120 HR, 18 RR, 96% RA, 164 CBG.
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
## 2024-03-28 ENCOUNTER — Other Ambulatory Visit (HOSPITAL_COMMUNITY)
Admission: EM | Admit: 2024-03-28 | Discharge: 2024-03-28 | Disposition: A | Discharge status: Home / Self Care | Payer: MEDICAID | Source: Home / Self Care

## 2024-03-28 ENCOUNTER — Ambulatory Visit (INDEPENDENT_AMBULATORY_CARE_PROVIDER_SITE_OTHER): Payer: MEDICAID | Admitting: Primary Care

## 2024-03-28 ENCOUNTER — Ambulatory Visit (HOSPITAL_COMMUNITY)
Admission: EM | Admit: 2024-03-28 | Discharge: 2024-03-28 | Disposition: A | Discharge status: Other Acute Inpatient Hospital | Payer: MEDICAID | Attending: Psychiatry | Admitting: Psychiatry

## 2024-03-28 DIAGNOSIS — Z79899 Other long term (current) drug therapy: Secondary | ICD-10-CM | POA: Insufficient documentation

## 2024-03-28 DIAGNOSIS — F259 Schizoaffective disorder, unspecified: Secondary | ICD-10-CM | POA: Insufficient documentation

## 2024-03-28 DIAGNOSIS — Z72811 Adult antisocial behavior: Secondary | ICD-10-CM | POA: Insufficient documentation

## 2024-03-28 DIAGNOSIS — F1910 Other psychoactive substance abuse, uncomplicated: Secondary | ICD-10-CM | POA: Insufficient documentation

## 2024-03-28 DIAGNOSIS — F1114 Opioid abuse with opioid-induced mood disorder: Secondary | ICD-10-CM | POA: Diagnosis not present

## 2024-03-28 DIAGNOSIS — F1410 Cocaine abuse, uncomplicated: Secondary | ICD-10-CM | POA: Insufficient documentation

## 2024-03-28 DIAGNOSIS — F17200 Nicotine dependence, unspecified, uncomplicated: Secondary | ICD-10-CM | POA: Insufficient documentation

## 2024-03-28 DIAGNOSIS — F319 Bipolar disorder, unspecified: Secondary | ICD-10-CM | POA: Insufficient documentation

## 2024-03-28 DIAGNOSIS — Z8619 Personal history of other infectious and parasitic diseases: Secondary | ICD-10-CM | POA: Insufficient documentation

## 2024-03-28 DIAGNOSIS — Z7982 Long term (current) use of aspirin: Secondary | ICD-10-CM | POA: Insufficient documentation

## 2024-03-28 LAB — CBG MONITORING, ED: Glucose-Capillary: 131 mg/dL — ABNORMAL HIGH (ref 70–99)

## 2024-03-28 MED ORDER — HYDROXYZINE HCL 25 MG PO TABS
25.0000 mg | ORAL_TABLET | Freq: Four times a day (QID) | ORAL | Status: DC | PRN
  Administered 2024-03-28: 25 mg via ORAL

## 2024-03-28 MED ORDER — METHOCARBAMOL 500 MG PO TABS: 500.0000 mg | ORAL_TABLET | Freq: Three times a day (TID) | ORAL | Status: DC | PRN

## 2024-03-28 MED ORDER — ADULT MULTIVITAMIN W/MINERALS CH
1.0000 | ORAL_TABLET | Freq: Every day | ORAL | Status: DC
  Filled 2024-03-28: qty 1

## 2024-03-28 MED ORDER — NAPROXEN 500 MG PO TABS
500.0000 mg | ORAL_TABLET | Freq: Two times a day (BID) | ORAL | Status: DC | PRN
  Administered 2024-03-28 (×2): 500 mg via ORAL
  Filled 2024-03-28 (×2): qty 1

## 2024-03-28 MED ORDER — ALUM & MAG HYDROXIDE-SIMETH 200-200-20 MG/5ML PO SUSP: 30.0000 mL | ORAL | Status: DC | PRN

## 2024-03-28 MED ORDER — OLANZAPINE 5 MG PO TBDP: 5.0000 mg | ORAL_TABLET | Freq: Three times a day (TID) | ORAL | Status: DC | PRN

## 2024-03-28 MED ORDER — CLONIDINE HCL 0.1 MG PO TABS: 0.1000 mg | ORAL_TABLET | ORAL | Status: DC

## 2024-03-28 MED ORDER — CEFADROXIL 500 MG PO CAPS
1000.0000 mg | ORAL_CAPSULE | Freq: Two times a day (BID) | ORAL | Status: DC
  Administered 2024-03-28: 1000 mg via ORAL
  Filled 2024-03-28 (×3): qty 2

## 2024-03-28 MED ORDER — MAGNESIUM HYDROXIDE 400 MG/5ML PO SUSP: 30.0000 mL | Freq: Every day | ORAL | Status: DC | PRN

## 2024-03-28 MED ORDER — ACETAMINOPHEN 325 MG PO TABS: 650.0000 mg | ORAL_TABLET | Freq: Four times a day (QID) | ORAL | Status: DC | PRN

## 2024-03-28 MED ORDER — LOPERAMIDE HCL 2 MG PO CAPS
2.0000 mg | ORAL_CAPSULE | ORAL | Status: DC | PRN
  Administered 2024-03-28: 2 mg via ORAL
  Filled 2024-03-28: qty 1

## 2024-03-28 MED ORDER — QUETIAPINE FUMARATE 50 MG PO TABS: 50.0000 mg | ORAL_TABLET | Freq: Every day | ORAL | Status: DC

## 2024-03-28 MED ORDER — DICYCLOMINE HCL 20 MG PO TABS: 20.0000 mg | ORAL_TABLET | Freq: Four times a day (QID) | ORAL | Status: DC | PRN

## 2024-03-28 MED ORDER — HYDROXYZINE HCL 25 MG PO TABS
25.0000 mg | ORAL_TABLET | Freq: Four times a day (QID) | ORAL | Status: DC | PRN
  Filled 2024-03-28: qty 1

## 2024-03-28 MED ORDER — ONDANSETRON 4 MG PO TBDP
4.0000 mg | ORAL_TABLET | Freq: Four times a day (QID) | ORAL | Status: DC | PRN
  Administered 2024-03-28: 4 mg via ORAL
  Filled 2024-03-28: qty 1

## 2024-03-28 MED ORDER — CLONIDINE HCL 0.1 MG PO TABS: 0.1000 mg | ORAL_TABLET | Freq: Every day | ORAL | Status: DC

## 2024-03-28 MED ORDER — LOPERAMIDE HCL 2 MG PO CAPS: 2.0000 mg | ORAL_CAPSULE | ORAL | Status: DC | PRN

## 2024-03-28 MED ORDER — THIAMINE HCL 100 MG/ML IJ SOLN
100.0000 mg | Freq: Once | INTRAMUSCULAR | Status: AC
  Administered 2024-03-28: 100 mg via INTRAMUSCULAR
  Filled 2024-03-28: qty 2

## 2024-03-28 MED ORDER — CHLORDIAZEPOXIDE HCL 25 MG PO CAPS: 25.0000 mg | ORAL_CAPSULE | Freq: Four times a day (QID) | ORAL | Status: DC | PRN

## 2024-03-28 MED ORDER — OLANZAPINE 10 MG IM SOLR: 5.0000 mg | Freq: Three times a day (TID) | INTRAMUSCULAR | Status: DC | PRN

## 2024-03-28 MED ORDER — CEFADROXIL 500 MG PO CAPS
1000.0000 mg | ORAL_CAPSULE | Freq: Two times a day (BID) | ORAL | Status: DC
  Administered 2024-03-28: 1000 mg via ORAL
  Filled 2024-03-28 (×2): qty 2

## 2024-03-28 MED ORDER — ONDANSETRON 4 MG PO TBDP: 4.0000 mg | ORAL_TABLET | Freq: Four times a day (QID) | ORAL | Status: DC | PRN

## 2024-03-28 MED ORDER — CEFADROXIL 500 MG PO CAPS: 1000.0000 mg | ORAL_CAPSULE | Freq: Two times a day (BID) | ORAL | 0 refills | Status: AC

## 2024-03-28 MED ORDER — POTASSIUM CHLORIDE CRYS ER 20 MEQ PO TBCR
20.0000 meq | EXTENDED_RELEASE_TABLET | Freq: Once | ORAL | Status: AC
  Administered 2024-03-28: 20 meq via ORAL
  Filled 2024-03-28: qty 1

## 2024-03-28 MED ORDER — OLANZAPINE 10 MG IM SOLR: 10.0000 mg | Freq: Three times a day (TID) | INTRAMUSCULAR | Status: DC | PRN

## 2024-03-28 MED ORDER — CLONIDINE HCL 0.1 MG PO TABS
0.1000 mg | ORAL_TABLET | Freq: Four times a day (QID) | ORAL | Status: DC
  Administered 2024-03-28: 0.1 mg via ORAL
  Filled 2024-03-28 (×2): qty 1
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
## 2024-03-28 NOTE — ED Notes (Signed)
 Pt Patient observed/assessed lying down asleep in bed. Patient aroused with verbal stimulation after several attempts to which patient responded in an agitated/annoyed demeanor. Affect is flat/agitated and eye contact is minimal. Patient evasive to preliminary assessment questions. Denies S/I and H/I. Denies A/V/H.

## 2024-03-28 NOTE — Progress Notes (Signed)
 Pt escorted to [facility] by Amy [facility]HT.

## 2024-03-28 NOTE — ED Provider Notes (Signed)
 [facility] Continuous Assessment Admission H&P  Date: 03/28/24 Patient Name: Mark Hammond MRN: [id number] Chief Complaint: OD on cocaine trude   Diagnoses:  Final diagnoses:  Polysubstance abuse (HCC)  Opioid abuse with opioid-induced mood disorder [facility])    HPI: Mark Hammond, 33 y/o male with a history of polysubstance abuse which include cocaine  and heroin and opioid abuse, SI, OD.  Presented to [facility] from the ED.  A review of patient records show multiple ED visits in the last 6 months.  A review of the ED notes show patient requested to to come to [facility].  According to the patient he used crack cocaine  and heroin and showed up.  Patient is very nonchalant about it and says he does this every 2 weeks.  Patient also stated he is trying to get disability.  According to him he does not work and he just moved back in with his girlfriend.  Face-to-face observation of patient, patient is alert and oriented x 4, speech is clear, maintained minimal eye contact.  Patient does appear to be very nonchalant about the whole visit.  He currently denies SI, HI, AVH or paranoia.  Patient is chronic substance abuser.  Patient ambulates with the aid of a rolling walker. Current patient does not seem to be influence by internal stimuli.  Given patient prior history and current presentation will recommend observation unit and reassessment in the AM   Total Time spent with patient: 20 minutes  Musculoskeletal  Strength & Muscle Tone: within normal limits Gait & Station: normal Patient leans: N/A  Psychiatric Specialty Exam  Presentation General Appearance:  Appropriate for Environment  Eye Contact: Fair  Speech: Clear and Coherent  Speech Volume: Normal  Handedness: Right   Mood and Affect  Mood: Anxious; Angry  Affect: Congruent   Thought Process  Thought Processes: Coherent  Descriptions of Associations:Intact  Orientation:Full (Time, Place and Person)  Thought  Content:WDL  Diagnosis of Schizophrenia or Schizoaffective disorder in past: Yes  Duration of Psychotic Symptoms: Greater than six months  Hallucinations:Hallucinations: None  Ideas of Reference:None  Suicidal Thoughts:Suicidal Thoughts: Yes, Passive  Homicidal Thoughts:Homicidal Thoughts: No   Sensorium  Memory: Immediate Fair  Judgment: Poor  Insight: Poor   Executive Functions  Concentration: Fair  Attention Span: Fair  Recall: Good  Fund of Knowledge: Good  Language: Good   Psychomotor Activity  Psychomotor Activity: Psychomotor Activity: Normal   Assets  Assets: Desire for Improvement; Resilience; Vocational/Educational   Sleep  Sleep: Sleep: Fair Number of Hours of Sleep: 6   Nutritional Assessment (For OBS and FBC admissions only) Has the patient had a weight loss or gain of 10 pounds or more in the last 3 months?: No Has the patient had a decrease in food intake/or appetite?: No Does the patient have dental problems?: No Does the patient have eating habits or behaviors that may be indicators of an eating disorder including binging or inducing vomiting?: No Has the patient recently lost weight without trying?: 0 Has the patient been eating poorly because of a decreased appetite?: 0 Malnutrition Screening Tool Score: 0    Physical Exam HENT:     Head: Normocephalic.     Nose: Nose normal.   Eyes:     Pupils: Pupils are equal, round, and reactive to light.    Cardiovascular:     Rate and Rhythm: Normal rate.  Pulmonary:     Effort: Pulmonary effort is normal.   Musculoskeletal:        General:  Normal range of motion.     Cervical back: Normal range of motion.   Neurological:     General: No focal deficit present.     Mental Status: He is alert.   Psychiatric:        Mood and Affect: Mood normal.        Behavior: Behavior normal.        Thought Content: Thought content normal.        Judgment: Judgment normal.     Review of Systems  Constitutional: Negative.   HENT: Negative.    Eyes: Negative.   Respiratory: Negative.    Cardiovascular: Negative.   Gastrointestinal: Negative.   Genitourinary: Negative.   Musculoskeletal: Negative.   Skin: Negative.   Neurological: Negative.   Psychiatric/Behavioral:  Positive for substance abuse. The patient is nervous/anxious.     Blood pressure 115/74, pulse 94, temperature 98.5 F (36.9 C), temperature source Oral, resp. rate 18, SpO2 98%. There is no height or weight on file to calculate BMI.  Past Psychiatric History: polysubstance abuse,  SI, MDD   Is the patient at risk to self? Yes  Has the patient been a risk to self in the past 6 months? Yes .    Has the patient been a risk to self within the distant past? Yes   Is the patient a risk to others? No   Has the patient been a risk to others in the past 6 months? No   Has the patient been a risk to others within the distant past? No   Past Medical History: see chart  Family History: unknown   Social History: cocaine , heroin,  opioid,  alcohol   Last Labs:  Admission on 03/27/2024, Discharged on 03/28/2024  Component Date Value Ref Range Status   Glucose-Capillary 03/28/2024 131 (H)  70 - 99 mg/dL Final   Glucose reference range applies only to samples taken after fasting for at least 8 hours.   Sodium 03/27/2024 137  135 - 145 mmol/L Final   Potassium 03/27/2024 3.2 (L)  3.5 - 5.1 mmol/L Final   Chloride 03/27/2024 101  98 - 111 mmol/L Final   CO2 03/27/2024 24  22 - 32 mmol/L Final   Glucose, Bld 03/27/2024 114 (H)  70 - 99 mg/dL Final   Glucose reference range applies only to samples taken after fasting for at least 8 hours.   BUN 03/27/2024 <5 (L)  6 - 20 mg/dL Final   Creatinine, Ser 03/27/2024 0.62  0.61 - 1.24 mg/dL Final   Calcium 93/74/7974 8.5 (L)  8.9 - 10.3 mg/dL Final   Total Protein 93/74/7974 8.6 (H)  6.5 - 8.1 g/dL Final   Albumin  03/27/2024 4.1  3.5 - 5.0 g/dL Final    AST 93/74/7974 43 (H)  15 - 41 U/L Final   ALT 03/27/2024 22  0 - 44 U/L Final   Alkaline Phosphatase 03/27/2024 122  38 - 126 U/L Final   Total Bilirubin 03/27/2024 0.7  0.0 - 1.2 mg/dL Final   GFR, Estimated 03/27/2024 >60  >60 mL/min Final   Comment: (NOTE) Calculated using the CKD-EPI Creatinine Equation (2021)    Anion gap 03/27/2024 12  5 - 15 Final   Performed at [facility], [address] [place], [place] [address]   Salicylate Lvl 03/27/2024 <7.0 (L)  7.0 - 30.0 mg/dL Final   Performed at [facility], [address] [place], [place] [address]   Acetaminophen  (Tylenol ), Serum 03/27/2024 <  10 (L)  10 - 30 ug/mL Final   Comment: (NOTE) Therapeutic concentrations vary significantly. A range of 10-30 ug/mL  may be an effective concentration for many patients. However, some  are best treated at concentrations outside of this range. Acetaminophen  concentrations >150 ug/mL at 4 hours after ingestion  and >50 ug/mL at 12 hours after ingestion are often associated with  toxic reactions.  Performed at [facility], [address], [place], [place] [address]    Alcohol, Ethyl (B) 03/27/2024 146 (H)  <15 mg/dL Final   Comment: (NOTE) For medical purposes only. Performed at [facility], [address], [place], [place] [address]    WBC 03/27/2024 8.0  4.0 - 10.5 K/uL Final   RBC 03/27/2024 3.83 (L)  4.22 - 5.81 MIL/uL Final   Hemoglobin 03/27/2024 10.9 (L)  13.0 - 17.0 g/dL Final   HCT 93/74/7974 34.2 (L)  39.0 - 52.0 % Final   MCV 03/27/2024 89.3  80.0 - 100.0 fL Final   MCH 03/27/2024 28.5  26.0 - 34.0 pg Final   MCHC 03/27/2024 31.9  30.0 - 36.0 g/dL Final   RDW 93/74/7974 13.2  11.5 - 15.5 % Final   Platelets 03/27/2024 318  150 - 400 K/uL Final   nRBC 03/27/2024 0.0  0.0 - 0.2 % Final   Neutrophils Relative % 03/27/2024 73  % Final   Neutro Abs 03/27/2024 5.8  1.7 - 7.7 K/uL Final   Lymphocytes  Relative 03/27/2024 20  % Final   Lymphs Abs 03/27/2024 1.6  0.7 - 4.0 K/uL Final   Monocytes Relative 03/27/2024 6  % Final   Monocytes Absolute 03/27/2024 0.5  0.1 - 1.0 K/uL Final   Eosinophils Relative 03/27/2024 0  % Final   Eosinophils Absolute 03/27/2024 0.0  0.0 - 0.5 K/uL Final   Basophils Relative 03/27/2024 1  % Final   Basophils Absolute 03/27/2024 0.1  0.0 - 0.1 K/uL Final   Immature Granulocytes 03/27/2024 0  % Final   Abs Immature Granulocytes 03/27/2024 0.03  0.00 - 0.07 K/uL Final   Performed at [facility], [address] [place], [place] [address]  Admission on 03/27/2024, Discharged on 03/27/2024  Component Date Value Ref Range Status   Sodium 03/27/2024 135  135 - 145 mmol/L Final   Potassium 03/27/2024 3.4 (L)  3.5 - 5.1 mmol/L Final   Chloride 03/27/2024 101  98 - 111 mmol/L Final   CO2 03/27/2024 20 (L)  22 - 32 mmol/L Final   Glucose, Bld 03/27/2024 161 (H)  70 - 99 mg/dL Final   Glucose reference range applies only to samples taken after fasting for at least 8 hours.   BUN 03/27/2024 9  6 - 20 mg/dL Final   Creatinine, Ser 03/27/2024 0.84  0.61 - 1.24 mg/dL Final   Calcium 93/74/7974 9.0  8.9 - 10.3 mg/dL Final   Total Protein 93/74/7974 8.6 (H)  6.5 - 8.1 g/dL Final   Albumin  03/27/2024 4.0  3.5 - 5.0 g/dL Final   AST 93/74/7974 46 (H)  15 - 41 U/L Final   ALT 03/27/2024 23  0 - 44 U/L Final   Alkaline Phosphatase 03/27/2024 114  38 - 126 U/L Final   Total Bilirubin 03/27/2024 0.4  0.0 - 1.2 mg/dL Final   GFR, Estimated 03/27/2024 >60  >60 mL/min Final   Comment: (NOTE) Calculated using the CKD-EPI Creatinine Equation (2021)    Anion gap 03/27/2024 14  5 - 15  Final   Performed at [facility], [address], [place], [place] [address]   Alcohol, Ethyl (B) 03/27/2024 48 (H)  <15 mg/dL Final   Comment: (NOTE) For medical purposes only. Performed at [facility], [address], [place], [place] [address]    WBC  03/27/2024 8.9  4.0 - 10.5 K/uL Final   RBC 03/27/2024 3.95 (L)  4.22 - 5.81 MIL/uL Final   Hemoglobin 03/27/2024 11.0 (L)  13.0 - 17.0 g/dL Final   HCT 93/74/7974 35.0 (L)  39.0 - 52.0 % Final   MCV 03/27/2024 88.6  80.0 - 100.0 fL Final   MCH 03/27/2024 27.8  26.0 - 34.0 pg Final   MCHC 03/27/2024 31.4  30.0 - 36.0 g/dL Final   RDW 93/74/7974 13.2  11.5 - 15.5 % Final   Platelets 03/27/2024 346  150 - 400 K/uL Final   nRBC 03/27/2024 0.0  0.0 - 0.2 % Final   Performed at [facility], [address], [place], [place] [address]   Opiates 03/27/2024 POSITIVE (A)  NONE DETECTED Final   Cocaine  03/27/2024 POSITIVE (A)  NONE DETECTED Final   Benzodiazepines 03/27/2024 POSITIVE (A)  NONE DETECTED Final   Amphetamines 03/27/2024 NONE DETECTED  NONE DETECTED Final   Tetrahydrocannabinol 03/27/2024 NONE DETECTED  NONE DETECTED Final   Barbiturates 03/27/2024 NONE DETECTED  NONE DETECTED Final   Comment: (NOTE) DRUG SCREEN FOR MEDICAL PURPOSES ONLY.  IF CONFIRMATION IS NEEDED FOR ANY PURPOSE, NOTIFY LAB WITHIN 5 DAYS.  LOWEST DETECTABLE LIMITS FOR URINE DRUG SCREEN Drug Class                     Cutoff (ng/mL) Amphetamine and metabolites    1000 Barbiturate and metabolites    200 Benzodiazepine                 200 Opiates and metabolites        300 Cocaine  and metabolites        300 THC                            50 Performed at [facility], [address], [place], [place] [address]    Salicylate Lvl 03/27/2024 <7.0 (L)  7.0 - 30.0 mg/dL Final   Performed at [facility], [address], [place], [place] [address]   Acetaminophen  (Tylenol ), Serum 03/27/2024 <10 (L)  10 - 30 ug/mL Final   Comment: (NOTE) Therapeutic concentrations vary significantly. A range of 10-30 ug/mL  may be an effective concentration for many patients. However, some  are best treated at concentrations outside of this range. Acetaminophen  concentrations >150 ug/mL at 4 hours after ingestion   and >50 ug/mL at 12 hours after ingestion are often associated with  toxic reactions.  Performed at [facility], [address] [place], [place] [address]    Glucose-Capillary 03/27/2024 160 (H)  70 - 99 mg/dL Final   Glucose reference range applies only to samples taken after fasting for at least 8 hours.   Lipase 03/27/2024 27  11 - 51 U/L Final   Performed at [facility], [address] [place], [place] [address]  No results displayed because visit has over 200 results.    Admission on 02/19/2024, Discharged on 02/19/2024  Component Date Value Ref Range Status   Glucose-Capillary 02/19/2024 114 (H)  70 - 99 mg/dL  Final   Glucose reference range applies only to samples taken after fasting for at least 8 hours.   Specimen Source 02/19/2024 URINE, CLEAN CATCH   Final   Color, Urine 02/19/2024 YELLOW  YELLOW Final   APPearance 02/19/2024 CLEAR  CLEAR Final   Specific Gravity, Urine 02/19/2024 1.009  1.005 - 1.030 Final   pH 02/19/2024 5.0  5.0 - 8.0 Final   Glucose, UA 02/19/2024 NEGATIVE  NEGATIVE mg/dL Final   Hgb urine dipstick 02/19/2024 NEGATIVE  NEGATIVE Final   Bilirubin Urine 02/19/2024 NEGATIVE  NEGATIVE Final   Ketones, ur 02/19/2024 NEGATIVE  NEGATIVE mg/dL Final   Protein, ur 94/80/7974 30 (A)  NEGATIVE mg/dL Final   Nitrite 94/80/7974 NEGATIVE  NEGATIVE Final   Leukocytes,Ua 02/19/2024 NEGATIVE  NEGATIVE Final   RBC / HPF 02/19/2024 0-5  0 - 5 RBC/hpf Final   WBC, UA 02/19/2024 0-5  0 - 5 WBC/hpf Final   Comment:        Reflex urine culture not performed if WBC <=10, OR if Squamous epithelial cells >5. If Squamous epithelial cells >5 suggest recollection.    Bacteria, UA 02/19/2024 RARE (A)  NONE SEEN Final   Squamous Epithelial / HPF 02/19/2024 0-5  0 - 5 /HPF Final   Mucus 02/19/2024 PRESENT   Final   Hyaline Casts, UA 02/19/2024 PRESENT   Final   Granular Casts, UA 02/19/2024 PRESENT   Final   Performed at [facility], [address] [place], [place] [address]   Neisseria Gonorrhea 02/19/2024 Negative   Final   Chlamydia 02/19/2024 Negative   Final   Comment 02/19/2024 Normal Reference Ranger Chlamydia - Negative   Final   Comment 02/19/2024 Normal Reference Range Neisseria Gonorrhea - Negative   Final   [facility] test code 02/19/2024 [id number]   Final   [facility] test name 02/19/2024 [id number]   Final   Source ([facility]) 02/19/2024 SERUM   Final   Performed at [facility], [address], [place], [place] [address]   Misc [facility] result 02/19/2024 COMMENT   Final   Comment: (NOTE) Test Ordered: [id number] HIV Ab/p24 Ag with Reflex HIV Ab/p24 Ag Screen           Note:                     BN     Non Reactive                                                  Reference Range: Non Reactive                          HIV-1/HIV-2 antibodies and HIV-1 p24 antigen were NOT detected. There is no laboratory evidence of HIV infection. HIV Negative Performed At: [facility] [address] [place], [place] [id number] Jennette Shorter MD Ey:[id number]   No results displayed because visit has over 200 results.    Admission on 01/26/2024, Discharged on 01/27/2024  Component Date Value Ref Range Status   Sodium 01/26/2024 132 (L)  135 - 145 mmol/L Final   Potassium 01/26/2024 3.6  3.5 - 5.1 mmol/L Final   Chloride 01/26/2024 95 (L)  98 - 111 mmol/L Final   CO2 01/26/2024 25  22 - 32 mmol/L Final   Glucose,  Bld 01/26/2024 121 (H)  70 - 99 mg/dL Final   Glucose reference range applies only to samples taken after fasting for at least 8 hours.   BUN 01/26/2024 5 (L)  6 - 20 mg/dL Final   Creatinine, Ser 01/26/2024 0.48 (L)  0.61 - 1.24 mg/dL Final   Calcium 95/74/7974 8.1 (L)  8.9 - 10.3 mg/dL Final   Total Protein 95/74/7974 7.3  6.5 - 8.1 g/dL Final   Albumin  01/26/2024 2.9 (L)  3.5 - 5.0 g/dL Final   AST 95/74/7974 90 (H)  15 - 41 U/L Final   ALT 01/26/2024 60 (H)  0 - 44 U/L Final   Alkaline Phosphatase 01/26/2024 116  38 - 126  U/L Final   Total Bilirubin 01/26/2024 0.4  0.0 - 1.2 mg/dL Final   GFR, Estimated 01/26/2024 >60  >60 mL/min Final   Comment: (NOTE) Calculated using the CKD-EPI Creatinine Equation (2021)    Anion gap 01/26/2024 12  5 - 15 Final   Performed at [facility], [address], [place], [place] [address]   Alcohol, Ethyl (B) 01/26/2024 74 (H)  <15 mg/dL Final   Comment: Please note change in reference range. (NOTE) For medical purposes only. Performed at [facility], [address], [place], [place] [address]    Opiates 01/26/2024 NONE DETECTED  NONE DETECTED Final   Cocaine  01/26/2024 POSITIVE (A)  NONE DETECTED Final   Benzodiazepines 01/26/2024 NONE DETECTED  NONE DETECTED Final   Amphetamines 01/26/2024 NONE DETECTED  NONE DETECTED Final   Tetrahydrocannabinol 01/26/2024 NONE DETECTED  NONE DETECTED Final   Barbiturates 01/26/2024 NONE DETECTED  NONE DETECTED Final   Comment: (NOTE) DRUG SCREEN FOR MEDICAL PURPOSES ONLY.  IF CONFIRMATION IS NEEDED FOR ANY PURPOSE, NOTIFY LAB WITHIN 5 DAYS.  LOWEST DETECTABLE LIMITS FOR URINE DRUG SCREEN Drug Class                     Cutoff (ng/mL) Amphetamine and metabolites    1000 Barbiturate and metabolites    200 Benzodiazepine                 200 Opiates and metabolites        300 Cocaine  and metabolites        300 THC                            50 Performed at [facility], [address], [place], [place] [address]    WBC 01/26/2024 11.2 (H)  4.0 - 10.5 K/uL Final   RBC 01/26/2024 3.75 (L)  4.22 - 5.81 MIL/uL Final   Hemoglobin 01/26/2024 11.1 (L)  13.0 - 17.0 g/dL Final   HCT 95/74/7974 33.5 (L)  39.0 - 52.0 % Final   MCV 01/26/2024 89.3  80.0 - 100.0 fL Final   MCH 01/26/2024 29.6  26.0 - 34.0 pg Final   MCHC 01/26/2024 33.1  30.0 - 36.0 g/dL Final   RDW 95/74/7974 15.0  11.5 - 15.5 % Final   Platelets 01/26/2024 367  150 - 400 K/uL Final   nRBC 01/26/2024 0.0  0.0 - 0.2 % Final   Neutrophils Relative %  01/26/2024 71  % Final   Neutro Abs 01/26/2024 8.0 (H)  1.7 - 7.7 K/uL Final   Lymphocytes Relative 01/26/2024 19  % Final   Lymphs Abs 01/26/2024 2.1  0.7 - 4.0 K/uL Final   Monocytes Relative  01/26/2024 9  % Final   Monocytes Absolute 01/26/2024 1.0  0.1 - 1.0 K/uL Final   Eosinophils Relative 01/26/2024 0  % Final   Eosinophils Absolute 01/26/2024 0.0  0.0 - 0.5 K/uL Final   Basophils Relative 01/26/2024 1  % Final   Basophils Absolute 01/26/2024 0.1  0.0 - 0.1 K/uL Final   Immature Granulocytes 01/26/2024 0  % Final   Abs Immature Granulocytes 01/26/2024 0.03  0.00 - 0.07 K/uL Final   Performed at [facility], [address], [place], [place] [address]   SARS Coronavirus 2 by RT PCR 01/26/2024 NEGATIVE  NEGATIVE Final   Influenza A by PCR 01/26/2024 NEGATIVE  NEGATIVE Final   Influenza B by PCR 01/26/2024 NEGATIVE  NEGATIVE Final   Comment: (NOTE) The Xpert Xpress SARS-CoV-2/FLU/RSV plus assay is intended as an aid in the diagnosis of influenza from Nasopharyngeal swab specimens and should not be used as a sole basis for treatment. Nasal washings and aspirates are unacceptable for Xpert Xpress SARS-CoV-2/FLU/RSV testing.  Fact Sheet for Patients: BloggerCourse.com  Fact Sheet for Healthcare Providers: SeriousBroker.it  This test is not yet approved or cleared by the [place]  FDA and has been authorized for detection and/or diagnosis of SARS-CoV-2 by FDA under an Emergency Use Authorization (EUA). This EUA will remain in effect (meaning this test can be used) for the duration of the COVID-19 declaration under Section 564(b)(1) of the Act, 21 U.S.C. section 360bbb-3(b)(1), unless the authorization is terminated or revoked.     Resp Syncytial Virus by PCR 01/26/2024 NEGATIVE  NEGATIVE Final   Comment: (NOTE) Fact Sheet for Patients: BloggerCourse.com  Fact Sheet for Healthcare  Providers: SeriousBroker.it  This test is not yet approved or cleared by the [place]  FDA and has been authorized for detection and/or diagnosis of SARS-CoV-2 by FDA under an Emergency Use Authorization (EUA). This EUA will remain in effect (meaning this test can be used) for the duration of the COVID-19 declaration under Section 564(b)(1) of the Act, 21 U.S.C. section 360bbb-3(b)(1), unless the authorization is terminated or revoked.  Performed at [facility], [address], [place], [place] [address]    Color, Urine 01/26/2024 YELLOW  YELLOW Final   APPearance 01/26/2024 CLEAR  CLEAR Final   Specific Gravity, Urine 01/26/2024 1.012  1.005 - 1.030 Final   pH 01/26/2024 5.0  5.0 - 8.0 Final   Glucose, UA 01/26/2024 NEGATIVE  NEGATIVE mg/dL Final   Hgb urine dipstick 01/26/2024 SMALL (A)  NEGATIVE Final   Bilirubin Urine 01/26/2024 NEGATIVE  NEGATIVE Final   Ketones, ur 01/26/2024 NEGATIVE  NEGATIVE mg/dL Final   Protein, ur 95/74/7974 NEGATIVE  NEGATIVE mg/dL Final   Nitrite 95/74/7974 NEGATIVE  NEGATIVE Final   Leukocytes,Ua 01/26/2024 NEGATIVE  NEGATIVE Final   RBC / HPF 01/26/2024 0-5  0 - 5 RBC/hpf Final   WBC, UA 01/26/2024 0-5  0 - 5 WBC/hpf Final   Bacteria, UA 01/26/2024 NONE SEEN  NONE SEEN Final   Squamous Epithelial / HPF 01/26/2024 0-5  0 - 5 /HPF Final   Mucus 01/26/2024 PRESENT   Final   Performed at [facility], [address] [place], [place] [address]  Admission on 01/22/2024, Discharged on 01/23/2024  Component Date Value Ref Range Status   Sodium 01/22/2024 135  135 - 145 mmol/L Final   Potassium 01/22/2024 3.2 (L)  3.5 - 5.1 mmol/L Final   Chloride 01/22/2024 101  98 - 111 mmol/L Final  CO2 01/22/2024 22  22 - 32 mmol/L Final   Glucose, Bld 01/22/2024 147 (H)  70 - 99 mg/dL Final   Glucose reference range applies only to samples taken after fasting for at least 8 hours.   BUN 01/22/2024 6  6 - 20 mg/dL Final    Creatinine, Ser 01/22/2024 0.74  0.61 - 1.24 mg/dL Final   Calcium 95/78/7974 8.7 (L)  8.9 - 10.3 mg/dL Final   Total Protein 95/78/7974 7.9  6.5 - 8.1 g/dL Final   Albumin  01/22/2024 3.0 (L)  3.5 - 5.0 g/dL Final   AST 95/78/7974 83 (H)  15 - 41 U/L Final   ALT 01/22/2024 75 (H)  0 - 44 U/L Final   Alkaline Phosphatase 01/22/2024 123  38 - 126 U/L Final   Total Bilirubin 01/22/2024 0.5  0.0 - 1.2 mg/dL Final   GFR, Estimated 01/22/2024 >60  >60 mL/min Final   Comment: (NOTE) Calculated using the CKD-EPI Creatinine Equation (2021)    Anion gap 01/22/2024 12  5 - 15 Final   Performed at [facility], [address] [place], [place] [address]   Alcohol, Ethyl (B) 01/22/2024 107 (H)  <10 mg/dL Final   Comment: (NOTE) For medical purposes only. Performed at [facility], [address] [place], [place] [address]    Salicylate Lvl 01/22/2024 <7.0 (L)  7.0 - 30.0 mg/dL Final   Performed at [facility], [address] [place], [place] [address]   Acetaminophen  (Tylenol ), Serum 01/22/2024 71 (H)  10 - 30 ug/mL Final   Comment: (NOTE) Therapeutic concentrations vary significantly. A range of 10-30 ug/mL  may be an effective concentration for many patients. However, some  are best treated at concentrations outside of this range. Acetaminophen  concentrations >150 ug/mL at 4 hours after ingestion  and >50 ug/mL at 12 hours after ingestion are often associated with  toxic reactions.  Performed at [facility], [address], [place], [place] [address]    WBC 01/22/2024 10.4  4.0 - 10.5 K/uL Final   RBC 01/22/2024 4.07 (L)  4.22 - 5.81 MIL/uL Final   Hemoglobin 01/22/2024 11.9 (L)  13.0 - 17.0 g/dL Final   HCT 95/78/7974 36.3 (L)  39.0 - 52.0 % Final   MCV 01/22/2024 89.2  80.0 - 100.0 fL Final   MCH 01/22/2024 29.2  26.0 - 34.0 pg Final   MCHC 01/22/2024 32.8  30.0 - 36.0 g/dL Final   RDW 95/78/7974 14.6  11.5 - 15.5 % Final   Platelets 01/22/2024 328  150 -  400 K/uL Final   nRBC 01/22/2024 0.0  0.0 - 0.2 % Final   Performed at [facility], [address] [place], [place] [address]   Opiates 01/22/2024 NONE DETECTED  NONE DETECTED Final   Cocaine  01/22/2024 POSITIVE (A)  NONE DETECTED Final   Benzodiazepines 01/22/2024 POSITIVE (A)  NONE DETECTED Final   Amphetamines 01/22/2024 NONE DETECTED  NONE DETECTED Final   Tetrahydrocannabinol 01/22/2024 POSITIVE (A)  NONE DETECTED Final   Barbiturates 01/22/2024 NONE DETECTED  NONE DETECTED Final   Comment: (NOTE) DRUG SCREEN FOR MEDICAL PURPOSES ONLY.  IF CONFIRMATION IS NEEDED FOR ANY PURPOSE, NOTIFY LAB WITHIN 5 DAYS.  LOWEST DETECTABLE LIMITS FOR URINE DRUG SCREEN Drug Class                     Cutoff (ng/mL) Amphetamine and metabolites    1000 Barbiturate and metabolites  200 Benzodiazepine                 200 Opiates and metabolites        300 Cocaine  and metabolites        300 THC                            50 Performed at [facility], [address] [place], [place] [address]    Acetaminophen  (Tylenol ), Serum 01/23/2024 17  10 - 30 ug/mL Final   Comment: (NOTE) Therapeutic concentrations vary significantly. A range of 10-30 ug/mL  may be an effective concentration for many patients. However, some  are best treated at concentrations outside of this range. Acetaminophen  concentrations >150 ug/mL at 4 hours after ingestion  and >50 ug/mL at 12 hours after ingestion are often associated with  toxic reactions.  Performed at [facility], [address], [place], [place] [address]    Total Protein 01/23/2024 7.7  6.5 - 8.1 g/dL Final   Albumin  01/23/2024 2.9 (L)  3.5 - 5.0 g/dL Final   AST 95/77/7974 77 (H)  15 - 41 U/L Final   ALT 01/23/2024 70 (H)  0 - 44 U/L Final   Alkaline Phosphatase 01/23/2024 114  38 - 126 U/L Final   Total Bilirubin 01/23/2024 0.7  0.0 - 1.2 mg/dL Final   Bilirubin, Direct 01/23/2024 0.1  0.0 - 0.2 mg/dL Final   Indirect Bilirubin  01/23/2024 0.6  0.3 - 0.9 mg/dL Final   Performed at [facility], [address], [place], [place] [address]  Admission on 01/20/2024, Discharged on 01/21/2024  Component Date Value Ref Range Status   Sodium 01/20/2024 139  135 - 145 mmol/L Final   Potassium 01/20/2024 3.4 (L)  3.5 - 5.1 mmol/L Final   Chloride 01/20/2024 101  98 - 111 mmol/L Final   CO2 01/20/2024 27  22 - 32 mmol/L Final   Glucose, Bld 01/20/2024 112 (H)  70 - 99 mg/dL Final   Glucose reference range applies only to samples taken after fasting for at least 8 hours.   BUN 01/20/2024 <5 (L)  6 - 20 mg/dL Final   Creatinine, Ser 01/20/2024 0.51 (L)  0.61 - 1.24 mg/dL Final   Calcium 95/80/7974 8.4 (L)  8.9 - 10.3 mg/dL Final   Total Protein 95/80/7974 7.9  6.5 - 8.1 g/dL Final   Albumin  01/20/2024 3.2 (L)  3.5 - 5.0 g/dL Final   AST 95/80/7974 90 (H)  15 - 41 U/L Final   ALT 01/20/2024 86 (H)  0 - 44 U/L Final   Alkaline Phosphatase 01/20/2024 128 (H)  38 - 126 U/L Final   Total Bilirubin 01/20/2024 0.3  0.0 - 1.2 mg/dL Final   GFR, Estimated 01/20/2024 >60  >60 mL/min Final   Comment: (NOTE) Calculated using the CKD-EPI Creatinine Equation (2021)    Anion gap 01/20/2024 11  5 - 15 Final   Performed at [facility], [address], [place], [place] [address]   Alcohol, Ethyl (B) 01/20/2024 255 (H)  <10 mg/dL Final   Comment: (NOTE) For medical purposes only. Performed at [facility], [address] [place], [place] [address]    Opiates 01/20/2024 NONE DETECTED  NONE DETECTED Final   Cocaine  01/20/2024 POSITIVE (A)  NONE DETECTED Final   Benzodiazepines 01/20/2024 NONE DETECTED  NONE DETECTED Final   Amphetamines 01/20/2024 NONE DETECTED  NONE DETECTED Final   Tetrahydrocannabinol 01/20/2024 NONE DETECTED  NONE DETECTED Final   Barbiturates 01/20/2024 NONE DETECTED  NONE DETECTED Final   Comment: (NOTE) DRUG SCREEN FOR MEDICAL PURPOSES ONLY.  IF CONFIRMATION IS  NEEDED FOR ANY PURPOSE, NOTIFY LAB WITHIN 5 DAYS.  LOWEST DETECTABLE LIMITS FOR URINE DRUG SCREEN Drug Class                     Cutoff (ng/mL) Amphetamine and metabolites    1000 Barbiturate and metabolites    200 Benzodiazepine                 200 Opiates and metabolites        300 Cocaine  and metabolites        300 THC                            50 Performed at [facility], [address], [place], [place] [address]    WBC 01/20/2024 11.6 (H)  4.0 - 10.5 K/uL Final   RBC 01/20/2024 4.20 (L)  4.22 - 5.81 MIL/uL Final   Hemoglobin 01/20/2024 12.4 (L)  13.0 - 17.0 g/dL Final   HCT 95/80/7974 37.7 (L)  39.0 - 52.0 % Final   MCV 01/20/2024 89.8  80.0 - 100.0 fL Final   MCH 01/20/2024 29.5  26.0 - 34.0 pg Final   MCHC 01/20/2024 32.9  30.0 - 36.0 g/dL Final   RDW 95/80/7974 14.6  11.5 - 15.5 % Final   Platelets 01/20/2024 344  150 - 400 K/uL Final   nRBC 01/20/2024 0.0  0.0 - 0.2 % Final   Neutrophils Relative % 01/20/2024 65  % Final   Neutro Abs 01/20/2024 7.6  1.7 - 7.7 K/uL Final   Lymphocytes Relative 01/20/2024 25  % Final   Lymphs Abs 01/20/2024 2.9  0.7 - 4.0 K/uL Final   Monocytes Relative 01/20/2024 9  % Final   Monocytes Absolute 01/20/2024 1.0  0.1 - 1.0 K/uL Final   Eosinophils Relative 01/20/2024 0  % Final   Eosinophils Absolute 01/20/2024 0.0  0.0 - 0.5 K/uL Final   Basophils Relative 01/20/2024 1  % Final   Basophils Absolute 01/20/2024 0.1  0.0 - 0.1 K/uL Final   Immature Granulocytes 01/20/2024 0  % Final   Abs Immature Granulocytes 01/20/2024 0.03  0.00 - 0.07 K/uL Final   Performed at [facility], [address], [place], [place] [address]  Admission on 01/14/2024, Discharged on 01/17/2024  Component Date Value Ref Range Status   SARS Coronavirus 2 by RT PCR 01/14/2024 NEGATIVE  NEGATIVE Final   Performed at [facility], [address], [place], [place] [address]   Sodium 01/16/2024 136  135 - 145 mmol/L Final    Potassium 01/16/2024 4.0  3.5 - 5.1 mmol/L Final   Chloride 01/16/2024 100  98 - 111 mmol/L Final   CO2 01/16/2024 27  22 - 32 mmol/L Final   Glucose, Bld 01/16/2024 99  70 - 99 mg/dL Final   Glucose reference range applies only to samples taken after fasting for at least 8 hours.   BUN 01/16/2024 5 (L)  6 - 20 mg/dL Final   Creatinine, Ser 01/16/2024 0.64  0.61 - 1.24 mg/dL Final   Calcium 95/84/7974 9.2  8.9 - 10.3 mg/dL Final   GFR, Estimated 01/16/2024 >60  >60 mL/min Final   Comment: (NOTE) Calculated using  the CKD-EPI Creatinine Equation (2021)    Anion gap 01/16/2024 9  5 - 15 Final   Performed at [facility], [address], [place], [place] [address]  Admission on 01/12/2024, Discharged on 01/14/2024  Component Date Value Ref Range Status   Sodium 01/12/2024 134 (L)  135 - 145 mmol/L Final   Potassium 01/12/2024 2.9 (L)  3.5 - 5.1 mmol/L Final   Chloride 01/12/2024 96 (L)  98 - 111 mmol/L Final   CO2 01/12/2024 26  22 - 32 mmol/L Final   Glucose, Bld 01/12/2024 171 (H)  70 - 99 mg/dL Final   Glucose reference range applies only to samples taken after fasting for at least 8 hours.   BUN 01/12/2024 8  6 - 20 mg/dL Final   Creatinine, Ser 01/12/2024 0.60 (L)  0.61 - 1.24 mg/dL Final   Calcium 95/88/7974 8.5 (L)  8.9 - 10.3 mg/dL Final   Total Protein 95/88/7974 7.5  6.5 - 8.1 g/dL Final   Albumin  01/12/2024 2.8 (L)  3.5 - 5.0 g/dL Final   AST 95/88/7974 49 (H)  15 - 41 U/L Final   ALT 01/12/2024 48 (H)  0 - 44 U/L Final   Alkaline Phosphatase 01/12/2024 112  38 - 126 U/L Final   Total Bilirubin 01/12/2024 0.5  0.0 - 1.2 mg/dL Final   GFR, Estimated 01/12/2024 >60  >60 mL/min Final   Comment: (NOTE) Calculated using the CKD-EPI Creatinine Equation (2021)    Anion gap 01/12/2024 12  5 - 15 Final   Performed at [facility], [address] [place], [place] [address]   Alcohol, Ethyl (B) 01/12/2024 194 (H)  <10 mg/dL Final   Comment:  (NOTE) Lowest detectable limit for serum alcohol is 10 mg/dL.  For medical purposes only. Performed at [facility], [address], [place], [place] [address]    Opiates 01/12/2024 POSITIVE (A)  NONE DETECTED Final   Cocaine  01/12/2024 POSITIVE (A)  NONE DETECTED Final   Benzodiazepines 01/12/2024 NONE DETECTED  NONE DETECTED Final   Amphetamines 01/12/2024 NONE DETECTED  NONE DETECTED Final   Tetrahydrocannabinol 01/12/2024 POSITIVE (A)  NONE DETECTED Final   Barbiturates 01/12/2024 NONE DETECTED  NONE DETECTED Final   Comment: (NOTE) DRUG SCREEN FOR MEDICAL PURPOSES ONLY.  IF CONFIRMATION IS NEEDED FOR ANY PURPOSE, NOTIFY LAB WITHIN 5 DAYS.  LOWEST DETECTABLE LIMITS FOR URINE DRUG SCREEN Drug Class                     Cutoff (ng/mL) Amphetamine and metabolites    1000 Barbiturate and metabolites    200 Benzodiazepine                 200 Opiates and metabolites        300 Cocaine  and metabolites        300 THC                            50 Performed at [facility], [address], [place], [place] [address]    WBC 01/12/2024 11.3 (H)  4.0 - 10.5 K/uL Final   RBC 01/12/2024 3.99 (L)  4.22 - 5.81 MIL/uL Final   Hemoglobin 01/12/2024 11.7 (L)  13.0 - 17.0 g/dL Final   HCT 95/88/7974 36.1 (L)  39.0 - 52.0 % Final   MCV 01/12/2024 90.5  80.0 - 100.0 fL Final   MCH 01/12/2024 29.3  26.0 - 34.0 pg Final   MCHC 01/12/2024 32.4  30.0 - 36.0 g/dL Final   RDW 95/88/7974 13.6  11.5 - 15.5 % Final   Platelets 01/12/2024 477 (H)  150 - 400 K/uL Final   nRBC 01/12/2024 0.0  0.0 - 0.2 % Final   Neutrophils Relative % 01/12/2024 76  % Final   Neutro Abs 01/12/2024 8.6 (H)  1.7 - 7.7 K/uL Final   Lymphocytes Relative 01/12/2024 16  % Final   Lymphs Abs 01/12/2024 1.8  0.7 - 4.0 K/uL Final   Monocytes Relative 01/12/2024 6  % Final   Monocytes Absolute 01/12/2024 0.7  0.1 - 1.0 K/uL Final   Eosinophils Relative 01/12/2024 1  % Final   Eosinophils  Absolute 01/12/2024 0.1  0.0 - 0.5 K/uL Final   Basophils Relative 01/12/2024 1  % Final   Basophils Absolute 01/12/2024 0.1  0.0 - 0.1 K/uL Final   Immature Granulocytes 01/12/2024 0  % Final   Abs Immature Granulocytes 01/12/2024 0.04  0.00 - 0.07 K/uL Final   Performed at [facility], [address], [place], [place] [address]   Sodium 01/13/2024 135  135 - 145 mmol/L Final   Potassium 01/13/2024 3.4 (L)  3.5 - 5.1 mmol/L Final   Chloride 01/13/2024 100  98 - 111 mmol/L Final   CO2 01/13/2024 28  22 - 32 mmol/L Final   Glucose, Bld 01/13/2024 106 (H)  70 - 99 mg/dL Final   Glucose reference range applies only to samples taken after fasting for at least 8 hours.   BUN 01/13/2024 8  6 - 20 mg/dL Final   Creatinine, Ser 01/13/2024 0.34 (L)  0.61 - 1.24 mg/dL Final   Calcium 95/87/7974 7.9 (L)  8.9 - 10.3 mg/dL Final   Total Protein 95/87/7974 7.1  6.5 - 8.1 g/dL Final   Albumin  01/13/2024 2.6 (L)  3.5 - 5.0 g/dL Final   AST 95/87/7974 41  15 - 41 U/L Final   ALT 01/13/2024 43  0 - 44 U/L Final   Alkaline Phosphatase 01/13/2024 125  38 - 126 U/L Final   Total Bilirubin 01/13/2024 0.3  0.0 - 1.2 mg/dL Final   GFR, Estimated 01/13/2024 >60  >60 mL/min Final   Comment: (NOTE) Calculated using the CKD-EPI Creatinine Equation (2021)    Anion gap 01/13/2024 7  5 - 15 Final   Performed at [facility], [address], [place], [place] [address]  There may be more visits with results that are not included.    Allergies: Codeine, Fish allergy, and Shellfish allergy  Medications:  PTA Medications  Medication Sig   QUEtiapine  (SEROQUEL ) 50 MG tablet Take 1 tablet (50 mg total) by mouth at bedtime. (Patient not taking: Reported on 03/27/2024)   aspirin  EC 81 MG tablet Take 1 tablet (81 mg total) by mouth daily. Swallow whole. (Patient not taking: Reported on 03/27/2024)   acetaminophen  (TYLENOL ) 500 MG tablet Take 2 tablets (1,000 mg total) by mouth every  6 (six) hours as needed for mild pain (pain score 1-3) or headache. (Patient not taking: Reported on 03/27/2024)   famotidine  (PEPCID ) 20 MG tablet Take 1 tablet (20 mg total) by mouth 2 (two) times daily.   cefadroxil  (DURICEF) 500 MG capsule Take 2 capsules (1,000 mg total) by mouth 2 (two) times daily for 25 days.      Medical Decision Making  Observation unit    Recommendations  Based on my evaluation the patient  does not appear to have an emergency medical condition.  Gaither Pouch, NP 03/28/24  4:18 AM
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
## 2024-03-28 NOTE — ED Provider Notes (Signed)
 I attempted to assess patient this morning, patient responded with I don't know or look in the chart. He was noted to be irritable and agitated. I explained to the patient that I need to complete the assessment to determine plan of care, and he stated, you can still do your fucking job. I explained to the patient that I will not be disrespected and will give him time to calm down. Will attempt to complete assessment later today. Per nursing, patient has been disrespectful and cursing on the unit.
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
## 2024-03-28 NOTE — ED Notes (Signed)
 Pt A&Ox 4. Pt denies SI/HI/AVH. He reported withdrawal symptoms such as nausea, diarrhea, body aches, slight tremor, and increasing anxiety. On admission his CIWA is 3 and COWS is 6. PRNs administered as per NP order. Pt oriented to the unit. Pt accepted meal and beverage was given. Support and encouragement provided. Facility protocol safety checks in place. Pt encouraged to notify staff if thoughts of hurting themselves or others arise. Pt verbalized understanding and agreement.

## 2024-03-28 NOTE — ED Notes (Signed)
 Pt given two sausage biscuits and cola

## 2024-03-28 NOTE — ED Notes (Signed)
 Pt started cursing when writer went to him to administer, his medication.

## 2024-03-28 NOTE — Discharge Instructions (Signed)
 Transfer to [facility]

## 2024-03-28 NOTE — ED Notes (Signed)
 Pt escorted on unit via staff for admission to [facility]. Writer proceeded to ask pt about his ambulatory status as pt was utilizing a walker. Pt proceeded to state, Why, is there a problem with someone using a walker (rolling his eyes). I don't have time for the bullshit questions. Writer proceeded to inform pt of rules and requirements to complete admission and tx on FBC. Pt states, I don't need nobody to tell me how to act. I'm a grown ass man and I don't have time for this shit. Writer informed provider of pt's negative behaviors. Provider attempted to explain criteria for admission to [facility] since pt was voluntary status. Pt states, This isn't gonna work. Just let me go. Discharge process started. Pt states, fuckin bitches. Pt discharged and escorted to lobby via security. Belongings given to pt from locker 28. Safety maintained.

## 2024-03-28 NOTE — ED Notes (Signed)
 NP in to eval pt.  Pt sitting up in bed quietly. Was given sandwich and drink.  No distress noted at this time.

## 2024-03-29 ENCOUNTER — Emergency Department (HOSPITAL_COMMUNITY)
Admission: EM | Admit: 2024-03-29 | Discharge: 2024-03-29 | Disposition: A | Discharge status: Home / Self Care | Payer: MEDICAID | Attending: Emergency Medicine | Admitting: Emergency Medicine

## 2024-03-29 ENCOUNTER — Other Ambulatory Visit: Payer: Self-pay

## 2024-03-29 ENCOUNTER — Telehealth (INDEPENDENT_AMBULATORY_CARE_PROVIDER_SITE_OTHER): Payer: Self-pay | Admitting: Primary Care

## 2024-03-29 ENCOUNTER — Ambulatory Visit (HOSPITAL_COMMUNITY)
Admission: EM | Admit: 2024-03-29 | Discharge: 2024-03-29 | Disposition: A | Discharge status: Other Acute Inpatient Hospital | Payer: MEDICAID | Attending: Mental Health | Admitting: Mental Health

## 2024-03-29 ENCOUNTER — Emergency Department (HOSPITAL_COMMUNITY): Payer: MEDICAID

## 2024-03-29 ENCOUNTER — Encounter (HOSPITAL_COMMUNITY): Payer: Self-pay

## 2024-03-29 DIAGNOSIS — F1120 Opioid dependence, uncomplicated: Secondary | ICD-10-CM

## 2024-03-29 DIAGNOSIS — F1910 Other psychoactive substance abuse, uncomplicated: Secondary | ICD-10-CM | POA: Insufficient documentation

## 2024-03-29 DIAGNOSIS — Z5900 Homelessness unspecified: Secondary | ICD-10-CM | POA: Insufficient documentation

## 2024-03-29 DIAGNOSIS — F17200 Nicotine dependence, unspecified, uncomplicated: Secondary | ICD-10-CM | POA: Insufficient documentation

## 2024-03-29 DIAGNOSIS — R4586 Emotional lability: Secondary | ICD-10-CM | POA: Insufficient documentation

## 2024-03-29 DIAGNOSIS — F1990 Other psychoactive substance use, unspecified, uncomplicated: Secondary | ICD-10-CM

## 2024-03-29 DIAGNOSIS — Z765 Malingerer [conscious simulation]: Secondary | ICD-10-CM | POA: Insufficient documentation

## 2024-03-29 DIAGNOSIS — M25552 Pain in left hip: Secondary | ICD-10-CM | POA: Diagnosis present

## 2024-03-29 DIAGNOSIS — F259 Schizoaffective disorder, unspecified: Secondary | ICD-10-CM | POA: Insufficient documentation

## 2024-03-29 DIAGNOSIS — Z008 Encounter for other general examination: Secondary | ICD-10-CM

## 2024-03-29 DIAGNOSIS — F1920 Other psychoactive substance dependence, uncomplicated: Secondary | ICD-10-CM | POA: Insufficient documentation

## 2024-03-29 LAB — RAPID URINE DRUG SCREEN, HOSP PERFORMED
Amphetamines: NOT DETECTED
Barbiturates: NOT DETECTED
Benzodiazepines: POSITIVE — AB
Cocaine: POSITIVE — AB
Opiates: NOT DETECTED
Tetrahydrocannabinol: NOT DETECTED

## 2024-03-29 LAB — COMPREHENSIVE METABOLIC PANEL WITH GFR
ALT: 20 U/L (ref 0–44)
AST: 43 U/L — ABNORMAL HIGH (ref 15–41)
Albumin: 3.3 g/dL — ABNORMAL LOW (ref 3.5–5.0)
Alkaline Phosphatase: 107 U/L (ref 38–126)
Anion gap: 10 (ref 5–15)
BUN: 8 mg/dL (ref 6–20)
CO2: 27 mmol/L (ref 22–32)
Calcium: 8.4 mg/dL — ABNORMAL LOW (ref 8.9–10.3)
Chloride: 103 mmol/L (ref 98–111)
Creatinine, Ser: 0.55 mg/dL — ABNORMAL LOW (ref 0.61–1.24)
GFR, Estimated: >60 mL/min (ref >60)
Glucose, Bld: 101 mg/dL — ABNORMAL HIGH (ref 70–99)
Potassium: 3.2 mmol/L — ABNORMAL LOW (ref 3.5–5.1)
Sodium: 140 mmol/L (ref 135–145)
Total Bilirubin: 0.5 mg/dL (ref 0.0–1.2)
Total Protein: 7 g/dL (ref 6.5–8.1)

## 2024-03-29 LAB — CBC WITH DIFFERENTIAL/PLATELET
Abs Immature Granulocytes: 0.01 10*3/uL (ref 0.00–0.07)
Basophils Absolute: 0 10*3/uL (ref 0.0–0.1)
Basophils Relative: 1 %
Eosinophils Absolute: 0 10*3/uL (ref 0.0–0.5)
Eosinophils Relative: 1 %
HCT: 30.5 % — ABNORMAL LOW (ref 39.0–52.0)
Hemoglobin: 9.7 g/dL — ABNORMAL LOW (ref 13.0–17.0)
Immature Granulocytes: 0 %
Lymphocytes Relative: 43 %
Lymphs Abs: 1.5 10*3/uL (ref 0.7–4.0)
MCH: 28.8 pg (ref 26.0–34.0)
MCHC: 31.8 g/dL (ref 30.0–36.0)
MCV: 90.5 fL (ref 80.0–100.0)
Monocytes Absolute: 0.4 10*3/uL (ref 0.1–1.0)
Monocytes Relative: 12 %
Neutro Abs: 1.5 10*3/uL — ABNORMAL LOW (ref 1.7–7.7)
Neutrophils Relative %: 43 %
Platelets: 204 10*3/uL (ref 150–400)
RBC: 3.37 MIL/uL — ABNORMAL LOW (ref 4.22–5.81)
RDW: 13.1 % (ref 11.5–15.5)
WBC: 3.4 10*3/uL — ABNORMAL LOW (ref 4.0–10.5)
nRBC: 0 % (ref 0.0–0.2)

## 2024-03-29 LAB — ETHANOL: Alcohol, Ethyl (B): <15 mg/dL (ref <15)

## 2024-03-29 MED ORDER — ALBUTEROL (5 MG/ML) CONTINUOUS INHALATION SOLN: 15.0000 mg/h | INHALATION_SOLUTION | Freq: Once | RESPIRATORY_TRACT | Status: DC

## 2024-03-29 NOTE — ED Triage Notes (Signed)
 PT BIB [facility], from [facility] [place] for medical clearance on his left hip. PT has hx of osteomyelitis.

## 2024-03-29 NOTE — ED Provider Notes (Signed)
 [place] EMERGENCY DEPARTMENT AT [facility] Provider Note   CSN: [id number] Arrival date & time: 03/29/24  9449     Patient presents with: Medical Clearance   Mark Hammond is a 33 y.o. male who presents here from behavioral health for medical clearance related to osteomyelitis of the left hip.  He has a extensive history of opioid dependence and was currently being managed for documented history of MDD, as well as schizoaffective disorder and polysubstance abuse.  Pain increased secondary to detox from opioids.  In reading behavioral health notes, he presented as a voluntary walk-in with complaints of SI and plan to overdose on drugs.  In reviewing his note from [facility] this morning, he stated to their provider stating that he did not have SI but states that he is seeking long-term rehab for substance abuse.  Further review of prior note this morning states that they transferred him to our ED for psych hold as they believe he needs a higher level of care.   HPI     Prior to Admission medications   Medication Sig Start Date End Date Taking? Authorizing Provider  cefadroxil  (DURICEF) 500 MG capsule Take 2 capsules (1,000 mg total) by mouth 2 (two) times daily for 25 days. 03/28/24 04/22/24  Griselda Norris, MD    Allergies: Codeine, Fish allergy, and Shellfish allergy    Review of Systems  Updated Vital Signs BP 100/64   Pulse 72   Temp 97.7 F (36.5 C) (Oral)   Resp 17   SpO2 100%   Physical Exam  (all labs ordered are listed, but only abnormal results are displayed) Labs Reviewed  COMPREHENSIVE METABOLIC PANEL WITH GFR - Abnormal; Notable for the following components:      Result Value   Potassium 3.2 (*)    Glucose, Bld 101 (*)    Creatinine, Ser 0.55 (*)    Calcium 8.4 (*)    Albumin  3.3 (*)    AST 43 (*)    All other components within normal limits  RAPID URINE DRUG SCREEN, HOSP PERFORMED - Abnormal; Notable for the following components:   Cocaine  POSITIVE  (*)    Benzodiazepines POSITIVE (*)    All other components within normal limits  CBC WITH DIFFERENTIAL/PLATELET - Abnormal; Notable for the following components:   WBC 3.4 (*)    RBC 3.37 (*)    Hemoglobin 9.7 (*)    HCT 30.5 (*)    Neutro Abs 1.5 (*)    All other components within normal limits  ETHANOL    EKG: None  Radiology: DG Pelvis Portable Result Date: 03/29/2024 CLINICAL DATA:  Left hip pain EXAM: PORTABLE PELVIS 1-2 VIEWS COMPARISON:  CT from 03/27/2024 FINDINGS: Again seen are changes of a left hip hemiarthroplasty. As noted on the CT from 03/27/2024 there is expansion of the acetabulum with mild protrusio deformity. Lateral subluxation of the left hip with partial uncovering of the aspect of the femoral head is similar to the exam from 03/27/2024. No signs of acute fracture noted. IMPRESSION: 1. Status post left hip hemiarthroplasty. 2. Lateral subluxation of the left hip with partial uncovering of the aspect of the femoral head is similar to the exam from 03/27/2024, but is new when compared with 02/23/2024. Electronically Signed   By: Waddell Calk M.D.   On: 03/29/2024 07:39     Procedures   Medications Ordered in the ED - No data to display  Medical Decision Making Amount and/or Complexity of Data Reviewed Labs: ordered. Radiology: ordered.   Medical Decision Making:   Mark Hammond is a 33 y.o. male who presented to the ED today with left hip pain and needs for medical clearance detailed above.    External chart has been reviewed including records from psychiatry/behavioral health. Patient's presentation is complicated by their history of polysubstance abuse.  Complete initial physical exam performed, notably the patient  was alert and oriented in no apparent distress.  He is resting comfortably in bed upon arrival to the patient's bedside.     Reviewed and confirmed nursing documentation for past medical history, family  history, social history.    Initial Assessment:   With the patient's presentation of left hip pain and desire for substance abuse counseling, most likely diagnosis is continued polysubstance abuse along with ongoing postsurgical left hip pain.    Initial Plan:  Obtain previously ordered x-ray of the left pelvis. Screening labs including CBC and Metabolic panel to evaluate for infectious or metabolic etiology of disease.  Urinalysis with reflex culture ordered to evaluate for UTI or relevant urologic/nephrologic pathology.  Obtain ethanol as per med clearance standard. EKG to evaluate for cardiac pathology Objective evaluation as below reviewed   Initial Study Results:   Laboratory  All laboratory results reviewed without evidence of clinically relevant pathology.   Exceptions include: Reviewed CBC, white count RBCs and hemoglobin all decreased but are stable in comparison to previous lab draws over the last 6 months.,  Noted hypokalemia at 3.2, further this is stable in comparison to previous lab draws.  Radiology:  All images reviewed independently. Agree with radiology report at this time.   DG Pelvis Portable Result Date: 03/29/2024 CLINICAL DATA:  Left hip pain EXAM: PORTABLE PELVIS 1-2 VIEWS COMPARISON:  CT from 03/27/2024 FINDINGS: Again seen are changes of a left hip hemiarthroplasty. As noted on the CT from 03/27/2024 there is expansion of the acetabulum with mild protrusio deformity. Lateral subluxation of the left hip with partial uncovering of the aspect of the femoral head is similar to the exam from 03/27/2024. No signs of acute fracture noted. IMPRESSION: 1. Status post left hip hemiarthroplasty. 2. Lateral subluxation of the left hip with partial uncovering of the aspect of the femoral head is similar to the exam from 03/27/2024, but is new when compared with 02/23/2024. Electronically Signed   By: Waddell Calk M.D.   On: 03/29/2024 07:39   CT ABDOMEN PELVIS W  CONTRAST Result Date: 03/27/2024 CLINICAL DATA:  Abdominal pain. Nonlocalized. Nausea and vomiting. Diarrhea. EXAM: CT ABDOMEN AND PELVIS WITH CONTRAST TECHNIQUE: Multidetector CT imaging of the abdomen and pelvis was performed using the standard protocol following bolus administration of intravenous contrast. RADIATION DOSE REDUCTION: This exam was performed according to the departmental dose-optimization program which includes automated exposure control, adjustment of the mA and/or kV according to patient size and/or use of iterative reconstruction technique. CONTRAST:  75mL OMNIPAQUE  IOHEXOL  350 MG/ML SOLN COMPARISON:  Pelvic CT from 02/04/2024. CT chest, abdomen and pelvis from 05/28/2023. FINDINGS: Lower chest: There is mild, low attenuation wall thickening involving the distal esophagus, image 9/3. no pleural fluid or airspace consolidation. Hepatobiliary: No suspicious liver lesion. Small low-density structure along the posterior dome of right lobe of liver is too small to characterize measuring 7 mm. Gallbladder appears normal. No biliary ductal dilatation. Pancreas: Unremarkable. No pancreatic ductal dilatation or surrounding inflammatory changes. Spleen: Normal in size without focal abnormality. Adrenals/Urinary Tract: Normal adrenal  glands. No nephrolithiasis, hydronephrosis or suspicious mass. Bladder appears normal. Stomach/Bowel: Mild gastric distension. No focal abnormality. The appendix is visualized and appears normal. No pathologic dilatation of the large or small bowel loops. No bowel wall thickening or inflammation. Vascular/Lymphatic: Normal appearance of the abdominal aorta. The upper abdominal vascularity appears patent. Upper abdominal adenopathy is identified. Index gastrohepatic ligament node measures 1.3 cm, image 25/3. Previously 1.1 cm. Left perigastric lymph node measures 1.3 cm, image 24/3. Previously 0.9 cm. Peripancreatic node measures 1.2 cm, image 27/3. Previously 0.9 cm. No  pelvic or inguinal adenopathy. Reproductive: Prostate is unremarkable. Other: No ascites or focal fluid collections. No signs of pneumoperitoneum. Musculoskeletal: No acute or suspicious osseous findings. 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 There is been increased bony remodeling with expansion of the acetabular cup with mild protrusio deformity. Lateral subluxation of the left femoral head with respect to the acetabular cup is noted with uncovering of the lateral aspect of the femoral head. IMPRESSION: 1. No acute findings within the abdomen or pelvis. 2. Mild, low attenuation wall thickening involving the distal esophagus. This is nonspecific but may reflect esophagitis. 3. Upper abdominal adenopathy is identified. This is mildly progressed when compared with the examination from 05/28/2023. Differential considerations include reactive adenopathy, granulomatous inflammation or infection, lymphoproliferative disease or metastatic adenopathy. Clinical correlation is advised. 4. Signs of previous left hip arthroplasty. There has been increased bony remodeling with expansion of the acetabular cup with mild protrusion deformity. Lateral subluxation of the left femoral head with respect to the acetabular cup is noted with uncovering of the lateral aspect of the femoral head. Electronically Signed   By: Waddell Calk M.D.   On: 03/27/2024 09:15   ECHOCARDIOGRAM COMPLETE Result Date: 03/12/2024    ECHOCARDIOGRAM REPORT   Patient Name:   Mark Hammond Date of Exam: 03/12/2024 Medical Rec #:  [id number]   Height:       63.0 in Accession #:    [id number]  Weight:       130.0 lb Date of Birth:  10-Apr-1991   BSA:          1.610 m Patient Age:    32 years    BP:           104/63 mmHg Patient Gender: M           HR:           94 bpm. Exam Location:  Inpatient Procedure: 2D Echo, Cardiac Doppler and Color Doppler (Both Spectral and Color            Flow Doppler were utilized during  procedure). Indications:    Murmur  History:        Patient has prior history of Echocardiogram examinations, most                 recent 02/05/2024. Risk Factors:Hypertension.  Sonographer:    Jayson Gaskins Referring Phys: [id number] COREAN SAILOR DIXON  Sonographer Comments: Patient rolled on right side. IMPRESSIONS  1. Left ventricular ejection fraction, by estimation, is 60 to 65%. The left ventricle has normal function. The left ventricle has no regional wall motion abnormalities. Left ventricular diastolic parameters were normal.  2. Right ventricular systolic function is normal. The right ventricular size is normal.  3. The mitral valve is normal in structure. No evidence of mitral valve regurgitation. No evidence of mitral stenosis.  4. The aortic valve was not well visualized. Aortic valve regurgitation is not visualized.  No aortic stenosis is present.  5. The inferior vena cava is normal in size with greater than 50% respiratory variability, suggesting right atrial pressure of 3 mmHg.  6. Cannot exclude a small PFO. Conclusion(s)/Recommendation(s): No evidence of valvular vegetations on this transthoracic echocardiogram. Consider a transesophageal echocardiogram to exclude infective endocarditis if clinically indicated. FINDINGS  Left Ventricle: No 3D or strain transmitted. Left ventricular ejection fraction, by estimation, is 60 to 65%. The left ventricle has normal function. The left ventricle has no regional wall motion abnormalities. Strain was performed and the global longitudinal strain is indeterminate. The left ventricular internal cavity size was normal in size. There is no left ventricular hypertrophy. Left ventricular diastolic parameters were normal. Right Ventricle: The right ventricular size is normal. No increase in right ventricular wall thickness. Right ventricular systolic function is normal. Left Atrium: Left atrial size was normal in size. Right Atrium: Right atrial size was normal in size.  Pericardium: There is no evidence of pericardial effusion. Mitral Valve: The mitral valve is normal in structure. No evidence of mitral valve regurgitation. No evidence of mitral valve stenosis. Tricuspid Valve: The tricuspid valve is normal in structure. Tricuspid valve regurgitation is not demonstrated. No evidence of tricuspid stenosis. Aortic Valve: The aortic valve was not well visualized. Aortic valve regurgitation is not visualized. No aortic stenosis is present. Aortic valve mean gradient measures 4.0 mmHg. Aortic valve peak gradient measures 6.2 mmHg. Aortic valve area, by VTI measures 2.16 cm. Pulmonic Valve: The pulmonic valve was normal in structure. Pulmonic valve regurgitation is not visualized. No evidence of pulmonic stenosis. Aorta: The aortic root and ascending aorta are structurally normal, with no evidence of dilitation. Venous: The inferior vena cava is normal in size with greater than 50% respiratory variability, suggesting right atrial pressure of 3 mmHg. IAS/Shunts: Cannot exclude a small PFO. Additional Comments: 3D was performed not requiring image post processing on an independent workstation and was indeterminate.  LEFT VENTRICLE PLAX 2D LVIDd:         4.40 cm   Diastology LVIDs:         3.20 cm   LV e' medial:    10.00 cm/s LV PW:         0.60 cm   LV E/e' medial:  7.4 LV IVS:        0.70 cm   LV e' lateral:   17.80 cm/s LVOT diam:     1.70 cm   LV E/e' lateral: 4.2 LV SV:         35 LV SV Index:   22 LVOT Area:     2.27 cm  RIGHT VENTRICLE RV S prime:     9.90 cm/s TAPSE (M-mode): 1.6 cm LEFT ATRIUM           Index        RIGHT ATRIUM          Index LA Vol (A2C): 15.9 ml 9.87 ml/m   RA Area:     6.55 cm LA Vol (A4C): 30.1 ml 18.69 ml/m  RA Volume:   11.10 ml 6.89 ml/m  AORTIC VALVE AV Area (Vmax):    2.07 cm AV Area (Vmean):   1.83 cm AV Area (VTI):     2.16 cm AV Vmax:           124.00 cm/s AV Vmean:          94.700 cm/s AV VTI:            0.162  m AV Peak Grad:      6.2 mmHg  AV Mean Grad:      4.0 mmHg LVOT Vmax:         113.00 cm/s LVOT Vmean:        76.500 cm/s LVOT VTI:          0.154 m LVOT/AV VTI ratio: 0.95 MITRAL VALVE MV Area (PHT): 3.56 cm    SHUNTS MV Decel Time: 213 msec    Systemic VTI:  0.15 m MV E velocity: 73.90 cm/s  Systemic Diam: 1.70 cm MV A velocity: 55.00 cm/s MV E/A ratio:  1.34 Stanly Leavens MD Electronically signed by Stanly Leavens MD Signature Date/Time: 03/12/2024/12:01:07 PM    Final      Reassessment and Plan:   Patient to be discharged with outpatient referrals for substance abuse therapy.  This was discussed with patient, he is acceptable to this and has no further concerns at this time.       Final diagnoses:  None    ED Discharge Orders     None          Myriam Dorn BROCKS, [name] 03/29/24 1246    Charlyn Sora, MD 03/30/24 [phone]

## 2024-03-29 NOTE — ED Notes (Signed)
 Patient given sausage biscuit and soda

## 2024-03-29 NOTE — Progress Notes (Signed)
   03/29/24 0403  [facility] Triage Screening (Walk-ins at [facility] only)  How Did You Hear About Us ? Self  What Is the Reason for Your Visit/Call Today? Pt presents to [facility] as a voluntary walk-in, unaccompanied with complaint of SI, with a plan to overdose on drugs. Pt has history of MDD, methamphetamine use disorder, alcohol use disorder, substance-induced mood disorder, polysubstance abuse, schizophrenia. Pt denies taking prescribed medications at this time. Pt was admitted to [facility] yesterday and requested to be discharged, as he was also being verbally aggressive towards staff. Pt currently denies HI,AVH and substance/alcohol use.  How Long Has This Been Causing You Problems? 1 wk - 1 month  Have You Recently Had Any Thoughts About Hurting Yourself? Yes  How long ago did you have thoughts about hurting yourself? currently  Are You Planning to Commit Suicide/Harm Yourself At This time? No  Have you Recently Had Thoughts About Hurting Someone [name]? No  Are You Planning To Harm Someone At This Time? No  Explanation: pt denies  Physical Abuse Denies  Verbal Abuse Denies  Sexual Abuse Denies  Exploitation of patient/patient's resources Denies  Self-Neglect Denies  Are you currently experiencing any auditory, visual or other hallucinations? No  Have You Used Any Alcohol or Drugs in the Past 24 Hours? No  What Did You Use and How Much? pt denies  Do you have any current medical co-morbidities that require immediate attention? No (using walker due to recent hip surgery)  Clinician description of patient physical appearance/behavior: calm, cooperative, continues to utilize walker  What Do You Feel Would Help You the Most Today? Treatment for Depression or other mood problem  If access to [facility] was not available, would you have sought care in the Emergency Department? Yes  Determination of Need Urgent (48 hours)  Options For Referral Other: Comment;BH Urgent Care;Outpatient Therapy;Medication  Management;Inpatient Hospitalization  Determination of Need filed? Yes

## 2024-03-29 NOTE — BH Assessment (Signed)
 Comprehensive Clinical Assessment (CCA) Note  03/29/2024 Mark Hammond [id number]  Disposition: Mark Ivans, NP recommends pt to be transferred to [facility], seeking long term program for alcohol dependence.   The patient demonstrates the following risk factors for suicide: Chronic risk factors for suicide include: substance use disorder. Acute risk factors for suicide include: Pt reports, he has suicidal ideations on and off but not currently. Protective factors for this patient include: Pt denies SI. Considering these factors, the overall suicide risk at this point appears to be moderate. Patient is not appropriate for outpatient follow up.  Mark Hammond is a 33 year old male who presents voluntary and unaccompanied to [facility] ([facility] Clinician asked the pt, what brought you to the hospital? Pt reports, having suicidal ideations on and off but not currently. Pt reports, hearing disrespectful voices, urging him to relapse. Pt denies, HI, self-injurious behaviors.   Pt reports, he snorted Heroin twice and smoked it four times yesterday (03/28/2024). Pt reports, smoking $10 worth of Crack Cocaine  yesterday (03/28/2024). Pt denies, being linked to OPT resources (medication management and/or counseling.) Pt has previous hospital admissions. Pt was discharged from [facility] yesterday evening (03/28/2024). Per chart, leading up discharge the pt was verbally aggressive towards staff.   Pt presents alert in causal attire sitting in assessment room with his walker pushed to the side. Pt's mood was labile. Pt's affect was congruent. Pt's insight, judgement was poor.   Chief Complaint:  Chief Complaint  Patient presents with   suicidal ideation   Addiction Problem   Visit Diagnosis: Opioid abuse with opioid induced mood disorder (HCC).  CCA Screening, Triage and Referral (STR)  Patient Reported Information How did you hear about us ? Self  What Is  the Reason for Your Visit/Call Today? Pt presents to [facility] as a voluntary walk-in, unaccompanied with complaint of SI, with a plan to overdose on drugs. Pt has history of MDD, methamphetamine use disorder, alcohol use disorder, substance-induced mood disorder, polysubstance abuse, schizophrenia. Pt denies taking prescribed medications at this time. Pt was admitted to [facility] yesterday and requested to be discharged, as he was also being verbally aggressive towards staff. Pt currently denies HI,AVH and substance/alcohol use.  How Long Has This Been Causing You Problems? 1 wk - 1 month  What Do You Feel Would Help You the Most Today? Treatment for Depression or other mood problem   Have You Recently Had Any Thoughts About Hurting Yourself? Yes  Are You Planning to Commit Suicide/Harm Yourself At This time? No   Flowsheet Row ED from 03/29/2024 in [facility] ED from 03/28/2024 in [facility] ED from 03/27/2024 in [facility] Emergency Department at [facility]  C-SSRS RISK CATEGORY Moderate Risk No Risk Error: Question 6 not populated    Have you Recently Had Thoughts About Hurting Someone Mark Hammond? No  Are You Planning to Harm Someone at This Time? No  Explanation: pt denies   Have You Used Any Alcohol or Drugs in the Past 24 Hours? No  How Long Ago Did You Use Drugs or Alcohol? 03/28/2024  What Did You Use and How Much? pt denies   Do You Currently Have a Therapist/Psychiatrist? No  Name of Therapist/Psychiatrist:    Have You Been Recently Discharged From Any Office Practice or Programs? Yes  Explanation of Discharge From Practice/Program: On 03/28/2024, pt left Facility Based Crisis AMA after staff asked medical questions.     CCA  Screening Triage Referral Assessment Type of Contact: Face-to-Face  Telemedicine Service Delivery:   Is this Initial or Reassessment?   Date Telepsych consult ordered in [facility]:    Time  Telepsych consult ordered in [facility]:    Location of Assessment: [facility] Assessment Services  Provider Location: [facility] Assessment Services   Collateral Involvement: NA   Does Patient Have a Automotive engineer Guardian? No  Legal Guardian Contact Information: Pt is his own guardian.  Copy of Legal Guardianship Form: -- (Pt is his own guardian.)  Legal Guardian Notified of Arrival: -- (Pt is his own guardian.)  Legal Guardian Notified of Pending Discharge: -- (Pt is his own guardian.)  If Minor and Not Living with Parent(s), Who has Custody? Pt is his own guardian.  Is CPS involved or ever been involved? -- (Pt is his own guardian.)  Is APS involved or ever been involved? -- (Pt is his own guardian.)   Patient Determined To Be At Risk for Harm To Self or Others Based on Review of Patient Reported Information or Presenting Complaint? Yes, for Self-Harm  Method: No Plan  Availability of Means: No access or NA  Intent: Vague intent or NA  Notification Required: No need or identified person  Additional Information for Danger to Others Potential: -- (NA)  Additional Comments for Danger to Others Potential: NA  Are There Guns or Other Weapons in Your Home? No  Types of Guns/Weapons: Pt denies.  Are These Weapons Safely Secured?                            -- (NA)  Who Could Verify You Are Able To Have These Secured: NA  Do You Have any Outstanding Charges, Pending Court Dates, Parole/Probation? Pt denies.  Contacted To Inform of Risk of Harm To Self or Others: Other: Comment (NA)    Does Patient Present under Involuntary Commitment? No    [place] of Residence: [place]   Patient Currently Receiving the Following Services: Not Receiving Services   Determination of Need: Urgent (48 hours)   Options For Referral: Other: Comment; [facility] Urgent Care; Outpatient Therapy; Medication Management; Inpatient Hospitalization     CCA Biopsychosocial Patient Reported  Schizophrenia/Schizoaffective Diagnosis in Past: No   Strengths: Pt reports, he wants to go to detox and long term placement.   Mental Health Symptoms Depression:  Difficulty Concentrating; Fatigue; Hopelessness; Worthlessness; Sleep (too much or little); Increase/decrease in appetite; Tearfulness (Isolation.)   Duration of Depressive symptoms: Duration of Depressive Symptoms: N/A   Mania:  None   Anxiety:   Difficulty concentrating; Fatigue; Restlessness; Tension; Worrying; Sleep; Irritability   Psychosis:  Hallucinations   Duration of Psychotic symptoms: Duration of Psychotic Symptoms: N/A   Trauma:  None   Obsessions:  None   Compulsions:  None   Inattention:  Forgetful; Loses things   Hyperactivity/Impulsivity:  Feeling of restlessness; Fidgets with hands/feet   Oppositional/Defiant Behaviors:  Angry; Argumentative; Temper   Emotional Irregularity:  None   Other Mood/Personality Symptoms:  NA    Mental Status Exam Appearance and self-care  Stature:  Small   Weight:  Average weight   Clothing:  Casual   Grooming:  Normal   Cosmetic use:  None   Posture/gait:  Normal   Motor activity:  -- (Pt uses a walker.)   Sensorium  Attention:  Normal   Concentration:  Normal   Orientation:  X5   Recall/memory:  Normal  Affect and Mood  Affect:  Congruent   Mood:  Other (Comment) (Labile.)   Relating  Eye contact:  Normal   Facial expression:  Responsive   Attitude toward examiner:  Cooperative   Thought and Language  Speech flow: Normal   Thought content:  Appropriate to Mood and Circumstances   Preoccupation:  None   Hallucinations:  Auditory   Organization:  Patent examiner of Knowledge:  Average   Intelligence:  Average   Abstraction:  Normal   Judgement:  Poor   Reality Testing:  Adequate   Insight:  Poor   Decision Making:  Normal   Social Functioning  Social Maturity:  Impulsive; Irresponsible    Social Judgement:  Chief of Staff   Stress  Stressors:  Illness; Housing; Office manager Ability:  Exhausted   Skill Deficits:  Self-control; Responsibility   Supports:  Support needed     Religion: Religion/Spirituality Are You A Religious Person?: No How Might This Affect Treatment?: NA  Leisure/Recreation: Leisure / Recreation Do You Have Hobbies?: Yes Leisure and Hobbies: Rap, listening to music, playing video games.  Exercise/Diet: Exercise/Diet Do You Exercise?: No Have You Gained or Lost A Significant Amount of Weight in the Past Six Months?: No Do You Follow a Special Diet?: No Do You Have Any Trouble Sleeping?: Yes Explanation of Sleeping Difficulties: Pt reports, getting 2-3 hours of sleep.   CCA Employment/Education Employment/Work Situation: Employment / Work Situation Employment Situation: Unemployed Patient's Job has Been Impacted by Current Illness: No Has Patient ever Been in Equities trader?: No  Education: Education Is Patient Currently Attending School?: No Last Grade Completed: 9 Did You Product manager?: No Did You Have An Individualized Education Program (IIEP): No Did You Have Any Difficulty At [employer]?: No Patient's Education Has Been Impacted by Current Illness: No   CCA Family/Childhood History Family and Relationship History: Family history Marital status: Single Does patient have children?: No  Childhood History:  Childhood History By whom was/is the patient raised?: Grandparents Midwife.) Did patient suffer any verbal/emotional/physical/sexual abuse as a child?: No Did patient suffer from severe childhood neglect?: No Has patient ever been sexually abused/assaulted/raped as an adolescent or adult?: No Was the patient ever a victim of a crime or a disaster?: No Witnessed domestic violence?: No Has patient been affected by domestic violence as an adult?: No  CCA Substance Use Alcohol/Drug Use: Alcohol / Drug Use Pain  Medications: See MAR Prescriptions: See MAR Over the Counter: See MAR History of alcohol / drug use?: Yes Longest period of sobriety (when/how long): 1.5 months (when he was in the hospital). Negative Consequences of Use: Financial, Personal relationships, Work / School Withdrawal Symptoms: None Substance #1 Name of Substance 1: Heroin. 1 - Age of First Use: 33 1 - Amount (size/oz): Pt reports, he snorted Heroin twice and smoked it four times yesterday (03/28/2024). 1 - Frequency: Per pt, not as often. 1 - Duration: Ongoing. 1 - Last Use / Amount: 03/28/2024. 1 - Method of Aquiring: UTA 1- Route of Use: Snort, Smoke. Substance #2 Name of Substance 2: Crack Cocaine . 2 - Age of First Use: 62-24 years old. 2 - Amount (size/oz): Pt reports, smoking $10 worth of Crack Cocaine  yesterday (03/28/2024). 2 - Frequency: Ongoing. 2 - Duration: Ongoing. 2 - Last Use / Amount: 03/28/2024. 2 - Method of Aquiring: UTA 2 - Route of Substance Use: Smoke. Substance #3 Name of Substance 3: Alcohol. 3 - Age of First Use: 13-16  years old. 3 - Amount (size/oz): Pt reports, drinking a lot of wine and beer, yesterday (03/28/2024). 3 - Frequency: Ongoing. 3 - Duration: Ongoing. 3 - Last Use / Amount: 03/28/2024. 3 - Method of Aquiring: UTA 3 - Route of Substance Use: Oral.    ASAM's:  Six Dimensions of Multidimensional Assessment  Dimension 1:  Acute Intoxication and/or Withdrawal Potential:   Dimension 1:  Description of individual's past and current experiences of substance use and withdrawal: Pt has ongoing use of Crack Cocaine  and Alcohol, pt recently started using Heroin.  Dimension 2:  Biomedical Conditions and Complications:   Dimension 2:  Description of patient's biomedical conditions and  complications: Pt has Hip surgery a few months ago, uses a walker.  Dimension 3:  Emotional, Behavioral, or Cognitive Conditions and Complications:  Dimension 3:  Description of emotional, behavioral, or  cognitive conditions and complications: Per chart, pt has the following diagnoses: schizoaffective disorder, depression, Bipolar 1 disorder (HCC), Amphetamine use disorder, severe (HCC), Opioid use disorder. Pt reports, suicidal ideations that are on and off.  Dimension 4:  Readiness to Change:  Dimension 4:  Description of Readiness to Change criteria: Patient seeking long term treatment and detox.  Dimension 5:  Relapse, Continued use, or Continued Problem Potential:  Dimension 5:  Relapse, continued use, or continued problem potential critiera description: Pt has ongoing substance use.  Dimension 6:  Recovery/Living Environment:  Dimension 6:  Recovery/Iiving environment criteria description: Pt reports, he lives with his girlfrined.  ASAM Severity Score: ASAM's Severity Rating Score: 11  ASAM Recommended Level of Treatment: ASAM Recommended Level of Treatment: Level III Residential Treatment   Substance use Disorder (SUD) Substance Use Disorder (SUD)  Checklist Symptoms of Substance Use: Continued use despite having a persistent/recurrent physical/psychological problem caused/exacerbated by use, Continued use despite persistent or recurrent social, interpersonal problems, caused or exacerbated by use, Large amounts of time spent to obtain, use or recover from the substance(s), Persistent desire or unsuccessful efforts to cut down or control use, Repeated use in physically hazardous situations, Social, occupational, recreational activities given up or reduced due to use, Substance(s) often taken in larger amounts or over longer times than was intended  Recommendations for Services/Supports/Treatments: Recommendations for Services/Supports/Treatments Recommendations For Services/Supports/Treatments: Medication Management, Individual Therapy, Inpatient Hospitalization, Facility Based Crisis  Disposition Recommendation per psychiatric provider: Pt to be transferred to [facility], seeking long term program  for alcohol dependence.    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   Polysubstance dependence (HCC) 12/13/2023   Polysubstance abuse (HCC) 11/21/2023   Alcohol use disorder 08/04/2023   Stimulant use disorder 08/04/2023   Opioid use disorder 08/04/2023   Tobacco use disorder 08/04/2023   Cannabis use disorder 08/04/2023   Hepatitis C 03/01/2023   Anxiety state 01/14/2023   Insomnia 01/14/2023   Schizoaffective disorder (HCC) 01/13/2023   Schizophrenia (HCC) 11/15/2019   Polysubstance (including opioids) dependence, daily use (HCC)    Substance induced mood disorder (HCC) 10/24/2019   Methamphetamine use disorder, 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   Alcohol use disorder, severe, dependence (HCC) 08/24/2019   Mild sedative, hypnotic, 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   MDD (major depressive disorder) 03/06/2019     Referrals to Alternative Service(s): Referred to Alternative Service(s):   Place:   Date:   Time:    Referred to Alternative Service(s):   Place:   Date:   Time:    Referred to Alternative Service(s):   Place:   Date:   Time:    Referred to Alternative Service(s):   Place:   Date:   Time:     Jackson JONETTA Broach, [facility] Comprehensive Clinical Assessment (CCA) Screening, Triage and Referral Note  03/29/2024 Mark Hammond [id number]  Chief Complaint:  Chief Complaint  Patient presents with   suicidal ideation   Addiction Problem   Visit Diagnosis:   Patient Reported Information How did you hear about us ? Self  What Is the Reason for Your Visit/Call Today? Pt presents to [facility] as a voluntary  walk-in, unaccompanied with complaint of SI, with a plan to overdose on drugs. Pt has history of MDD, methamphetamine use disorder, alcohol use disorder, substance-induced mood disorder, polysubstance abuse, schizophrenia. Pt denies taking prescribed medications at this time. Pt was admitted to [facility] yesterday and requested to be discharged, as he was also being verbally aggressive towards staff. Pt currently denies HI,AVH and substance/alcohol use.  How Long Has This Been Causing You Problems? 1 wk - 1 month  What Do You Feel Would Help You the Most Today? Treatment for Depression or other mood problem   Have You Recently Had Any Thoughts About Hurting Yourself? Yes  Are You Planning to Commit Suicide/Harm Yourself At This time? No   Have you Recently Had Thoughts About Hurting Someone Mark Hammond? No  Are You Planning to Harm Someone at This Time? No  Explanation: pt denies   Have You Used Any Alcohol or Drugs in the Past 24 Hours? No  How Long Ago Did You Use Drugs or Alcohol? 03/28/2024  What Did You Use and How Much? pt denies   Do You Currently Have a Therapist/Psychiatrist? No  Name of Therapist/Psychiatrist: Pt denies any outpatient services.   Have You Been Recently Discharged From Any Office Practice or Programs? Yes  Explanation of Discharge From Practice/Program: On 03/28/2024, pt left Facility Based Crisis AMA after staff asked medical questions.    CCA Screening Triage Referral Assessment Type of Contact: Face-to-Face  Telemedicine Service Delivery:   Is this Initial or Reassessment?   Date Telepsych consult ordered in [facility]:    Time Telepsych consult ordered in [facility]:    Location of Assessment: [facility]  Provider Location: [facility]    Collateral Involvement: NA   Does Patient Have a Automotive engineer Guardian? No. Name and Contact of Legal Guardian: Pt is his own guardian.  If Minor and Not Living with Parent(s), Who has  Custody? Pt is his own guardian.  Is CPS involved or ever been involved? -- (Pt is his own guardian.)  Is APS involved or ever been involved? -- (Pt is his own guardian.)   Patient Determined To Be At Risk for Harm To Self or Others Based on Review of Patient Reported Information or Presenting Complaint? Yes, for Self-Harm  Method: No Plan  Availability of Means: No access or NA  Intent: Vague intent or NA  Notification Required: No need or identified person  Additional Information for Danger to Others Potential: -- (NA)  Additional Comments for Danger to Others Potential: NA  Are There Guns or Other Weapons in Your Home? No  Types  of Guns/Weapons: Pt denies.  Are These Weapons Safely Secured?                            -- (NA)  Who Could Verify You Are Able To Have These Secured: NA  Do You Have any Outstanding Charges, Pending Court Dates, Parole/Probation? Pt denies.  Contacted To Inform of Risk of Harm To Self or Others: Other: Comment (NA)   Does Patient Present under Involuntary Commitment? No    [place] of Residence: [place]   Patient Currently Receiving the Following Services: Not Receiving Services   Determination of Need: Urgent (48 hours)   Options For Referral: Other: Comment; [facility] Urgent Care; Outpatient Therapy; Medication Management; Inpatient Hospitalization   Disposition Recommendation per psychiatric provider: Pt to be transferred to [facility], seeking long term program for alcohol dependence.   Jackson JONETTA Broach, LCMHC   Waverly Chavarria D Romani Wilbon, MS, [facility], [facility] Triage Specialist [phone]

## 2024-03-29 NOTE — Telephone Encounter (Signed)
 Called pt reschedule missed appt. Pt did not answer and LVM.

## 2024-03-29 NOTE — Discharge Instructions (Signed)
   Discharge recommendations:  Patient is to take medications as prescribed. Please see information for follow-up appointment with psychiatry and therapy. Please follow up with your primary care provider for all medical related needs.   Therapy: We recommend that patient participate in individual therapy to address mental health concerns.  Safety:  The patient should abstain from use of illicit substances/drugs and abuse of any medications. If symptoms worsen or do not continue to improve or if the patient becomes actively suicidal or homicidal then it is recommended that the patient return to the closest hospital emergency department, the [facility], or call 911 for further evaluation and treatment. National Suicide Prevention Lifeline 1-800-SUICIDE or 343-540-1664.  About 988 988 offers 24/7 access to trained crisis counselors who can help people experiencing mental health-related distress. People can call or text 988 or chat 988lifeline.org for themselves or if they are worried about a loved one who may need crisis support.  Crisis Mobile: [facility] Altern[facility]:                     541-402-3479 (for crisis response 24 hours a day) [facility] Hotline:                                            (929) 468-9115

## 2024-03-29 NOTE — ED Provider Notes (Signed)
 Behavioral Health Urgent Care Medical Screening Exam  Patient Name: Mark Hammond MRN: [id number] Date of Evaluation: 03/29/24 Chief Complaint:  Talking to or looking at me the wrong way sets me off Diagnosis:  Final diagnoses:  Polysubstance dependence including opioid type drug, episodic abuse (HCC)  Malingering  Homelessness unspecified    History of Present illness: Mark Hammond is a 33 y.o. male a documented history of MDD, methamphetamine use disorder, alcohol use disorder, substance-induced mood disorder, polysubstance abuse, schizophrenia, schizoaffective disorder, insomnia, stimulant use disorder, bipolar 1, SI, sheltered homelessness, history of unintentional OD and antisocial behavior, who presented to [facility] with initial complaint of SI, with a plan to overdose on drugs.  Patient was seen face to face by this provider and chart reviewed. Per chart review, patient was admitted to the [facility] from [facility] yesterday. He was very antisocial while on the unit and eventually requested discharge, following his constant harassment, verbal and emotional abuse of male staff members with rants laced with profanity, explicit content and name calling.  Of note, patient has had 32 ED/UC visits in the past 6 months.   On evaluation, patient is denying SI and states I'm here for the same thing, detox, and possible assistance with preferably long-term rehab for substance abuse.  He states  saying the wrong thing or a certain look usually sets me off and that's what happened yesterday. He is not remorseful and blames staff for his behavior.  He reports concurrent use of heroine, crack cocaine , and alcohol, all last used yesterday. He reports snorting heroin twice as smoking it 4 times yesterday and drinking a lot of wine and beer.  He is currently denying SI and states it comes off and on, especially when I am withdrawing.  He denies current withdrawal symptoms. He denies HI.  He denies access  to a gun or weapons.  He endorses auditory hallucinations and when asked to explain in detail he reports just disrespectful comments towards other people.   He is not established with outpatient psychiatric services for medication management or therapy.  He reports currently living with his girlfriend.  He is interested in long-term/residential substance abuse treatment/program.  On evaluation, patient is alert, oriented x 3, calm with labile mood. Speech is clear and coherent. Pt appears casually dressed. Eye contact is good. Mood is labile, affect is congruent with mood. Thought process is goal directed and thought content is WDL. Pt denies SI/HI/VH. There is no objective indication that the patient is responding to internal stimuli. No delusions elicited during this assessment.     Discussed recommendation for transfer to [facility] for psych hold due to needing higher level of care. He is a safety concern to staff and other patients, given the open milieu in the obs unit. Patient will not be returning to the [facility] based on this visit. Recommend for SW to follow up with available resources targeting the patient's needs for residential substance abuse treatment/program.  Patient is provided with opportunity for questions.  He verbalizes understanding and is in agreement.  Flowsheet Row ED from 03/29/2024 in [facility] ED from 03/28/2024 in [facility] ED from 03/27/2024 in [facility] Emergency Department at [facility]  C-SSRS RISK CATEGORY Moderate Risk No Risk Error: Question 6 not populated    Psychiatric Specialty Exam  Presentation  General Appearance:Casual  Eye Contact:Good  Speech:Normal Rate  Speech Volume:Normal  Handedness:Right   Mood and Affect  Mood: Labile  Affect: Congruent  Thought Process  Thought Processes: Goal Directed; Coherent  Descriptions of  Associations:Intact  Orientation:Partial  Thought Content:WDL  Diagnosis of Schizophrenia or Schizoaffective disorder in past: No   Hallucinations:Auditory He reports hearing voices making disrespectful comments towards other people denies any today  Ideas of Reference:None  Suicidal Thoughts:No Without Plan Without Intent; Without Plan; Without Access to Means; Without Means to Carry Out  Homicidal Thoughts:No   Sensorium  Memory: Immediate Fair  Judgment: Poor  Insight: Poor   Executive Functions  Concentration: Good  Attention Span: Good  Recall: [employer] of Knowledge: Fair  Language: Fair   Psychomotor Activity  Psychomotor Activity: Normal   Assets  Assets: Manufacturing systems engineer; Desire for Improvement   Sleep  Sleep: Fair  Number of hours:  6   Physical Exam: Physical Exam Constitutional:      General: He is not in acute distress.    Appearance: He is not diaphoretic.  HENT:     Nose: No congestion.   Cardiovascular:     Rate and Rhythm: Normal rate.  Pulmonary:     Effort: No respiratory distress.  Chest:     Chest wall: No tenderness.   Neurological:     Mental Status: He is alert and oriented to person, place, and time.   Psychiatric:        Attention and Perception: Attention normal.        Mood and Affect: Affect is labile.        Speech: Speech normal.        Behavior: Behavior normal.    Review of Systems  Constitutional:  Negative for chills, diaphoresis and fever.  HENT:  Negative for congestion.   Eyes:  Negative for discharge.  Respiratory:  Negative for cough, shortness of breath and wheezing.   Cardiovascular:  Negative for chest pain and palpitations.  Gastrointestinal:  Negative for diarrhea, nausea and vomiting.  Neurological:  Negative for dizziness, seizures, weakness and headaches.  Psychiatric/Behavioral:  Positive for substance abuse.    Blood pressure 122/78, pulse 96, temperature 98.5  F (36.9 C), resp. rate 20, SpO2 99%. There is no height or weight on file to calculate BMI.  Musculoskeletal: Strength & Muscle Tone: decreased Gait & Station: ambulates with a walker Patient leans: ambulates with a walker   BHUC MSE Discharge Disposition for Follow up and Recommendations: Based on my observation the patient does not appear to have an emergency medical condition but is recommended for transfer to [facility] due for psych hold, due to needing a higher level of care.  He is a safety concern to staff and other patients at the [facility], given the open milieu in the obs unit and his history verbal aggression/emotional abuse and antisocial behaviors. Patient will not be returning to the [facility].  Recommend placement in psych hold and re-eval in the am for residential long term substance abuse treatment program. Recommend for SW to follow up with available resources and placement targeting the patient's needs for residential substance abuse treatment/program.   Patient denies SI/HI/AVH or paranoia.   Discharge recommendations:  Please follow up with your primary care provider for all medical related needs.   Therapy: We recommend that patient participate in individual therapy to address mental health concerns.  Medications: The patient or guardian is to contact a medical professional and/or outpatient provider to address any new side effects that develop. The patient or guardian should update outpatient providers of any new medications and/or medication changes.   Atypical antipsychotics:  If you are prescribed an atypical antipsychotic, it is recommended that your height, weight, BMI, blood pressure, fasting lipid panel, and fasting blood sugar be monitored by your outpatient providers.  Safety:  The patient should abstain from use of illicit substances/drugs and abuse of any medications. If symptoms worsen or do not continue to improve or if the patient becomes actively suicidal or  homicidal then it is recommended that the patient return to the closest hospital emergency department, the [facility], or call 911 for further evaluation and treatment. National Suicide Prevention Lifeline 1-800-SUICIDE or 510-348-1164.  About 988 988 offers 24/7 access to trained crisis counselors who can help people experiencing mental health-related distress. People can call or text 988 or chat 988lifeline.org for themselves or if they are worried about a loved one who may need crisis support.  Crisis Mobile: [employer]:                     (567)257-4872 (for crisis response 24 hours a day) [facility] Hotline:                                            (503)485-0245   I spoke with Dr. Donzetta at [facility] and the provider has agreed to accept the patient.  Patient is transferred in stable condition. EMTALA completed  Thurman LULLA Ivans, NP 03/29/2024, 5:32 AM

## 2024-03-29 NOTE — ED Notes (Signed)
 Patient upset that he was being discharge, pulled his leads and stickers off. Threw water  cup at wall. Security called for stand by. 1:10p Patient departed saying he is going to get high.

## 2024-03-29 NOTE — Discharge Instructions (Addendum)
 For her Suboxone treatment, contact the below information:  Call: [phone]  Address: [address] [place], [place] [address] Email: [email or web address]

## 2024-03-30 ENCOUNTER — Encounter (HOSPITAL_COMMUNITY): Payer: Self-pay

## 2024-03-30 ENCOUNTER — Emergency Department (HOSPITAL_COMMUNITY)
Admission: EM | Admit: 2024-03-30 | Discharge: 2024-04-01 | Disposition: A | Discharge status: Home / Self Care | Payer: MEDICAID | Attending: Emergency Medicine | Admitting: Emergency Medicine

## 2024-03-30 DIAGNOSIS — F259 Schizoaffective disorder, unspecified: Secondary | ICD-10-CM | POA: Insufficient documentation

## 2024-03-30 DIAGNOSIS — I10 Essential (primary) hypertension: Secondary | ICD-10-CM | POA: Diagnosis not present

## 2024-03-30 DIAGNOSIS — Z96642 Presence of left artificial hip joint: Secondary | ICD-10-CM | POA: Insufficient documentation

## 2024-03-30 DIAGNOSIS — T401X1A Poisoning by heroin, accidental (unintentional), initial encounter: Secondary | ICD-10-CM | POA: Diagnosis present

## 2024-03-30 DIAGNOSIS — F1020 Alcohol dependence, uncomplicated: Secondary | ICD-10-CM | POA: Diagnosis not present

## 2024-03-30 DIAGNOSIS — F1420 Cocaine dependence, uncomplicated: Secondary | ICD-10-CM | POA: Diagnosis present

## 2024-03-30 DIAGNOSIS — E876 Hypokalemia: Secondary | ICD-10-CM | POA: Diagnosis not present

## 2024-03-30 DIAGNOSIS — F1120 Opioid dependence, uncomplicated: Secondary | ICD-10-CM | POA: Diagnosis present

## 2024-03-30 DIAGNOSIS — F1920 Other psychoactive substance dependence, uncomplicated: Secondary | ICD-10-CM | POA: Diagnosis present

## 2024-03-30 DIAGNOSIS — F1994 Other psychoactive substance use, unspecified with psychoactive substance-induced mood disorder: Secondary | ICD-10-CM | POA: Diagnosis not present

## 2024-03-30 DIAGNOSIS — R45851 Suicidal ideations: Secondary | ICD-10-CM

## 2024-03-30 LAB — ETHANOL: Alcohol, Ethyl (B): 243 mg/dL — ABNORMAL HIGH (ref <15)

## 2024-03-30 LAB — COMPREHENSIVE METABOLIC PANEL WITH GFR
ALT: 25 U/L (ref 0–44)
AST: 53 U/L — ABNORMAL HIGH (ref 15–41)
Albumin: 3.5 g/dL (ref 3.5–5.0)
Alkaline Phosphatase: 103 U/L (ref 38–126)
Anion gap: 13 (ref 5–15)
BUN: 5 mg/dL — ABNORMAL LOW (ref 6–20)
CO2: 23 mmol/L (ref 22–32)
Calcium: 8.1 mg/dL — ABNORMAL LOW (ref 8.9–10.3)
Chloride: 106 mmol/L (ref 98–111)
Creatinine, Ser: 0.58 mg/dL — ABNORMAL LOW (ref 0.61–1.24)
GFR, Estimated: >60 mL/min (ref >60)
Glucose, Bld: 107 mg/dL — ABNORMAL HIGH (ref 70–99)
Potassium: 3.3 mmol/L — ABNORMAL LOW (ref 3.5–5.1)
Sodium: 142 mmol/L (ref 135–145)
Total Bilirubin: 0.4 mg/dL (ref 0.0–1.2)
Total Protein: 7.4 g/dL (ref 6.5–8.1)

## 2024-03-30 LAB — CBC WITH DIFFERENTIAL/PLATELET
Abs Immature Granulocytes: 0.03 10*3/uL (ref 0.00–0.07)
Basophils Absolute: 0.1 10*3/uL (ref 0.0–0.1)
Basophils Relative: 1 %
Eosinophils Absolute: 0.1 10*3/uL (ref 0.0–0.5)
Eosinophils Relative: 1 %
HCT: 32.1 % — ABNORMAL LOW (ref 39.0–52.0)
Hemoglobin: 10.6 g/dL — ABNORMAL LOW (ref 13.0–17.0)
Immature Granulocytes: 1 %
Lymphocytes Relative: 33 %
Lymphs Abs: 2 10*3/uL (ref 0.7–4.0)
MCH: 29 pg (ref 26.0–34.0)
MCHC: 33 g/dL (ref 30.0–36.0)
MCV: 87.7 fL (ref 80.0–100.0)
Monocytes Absolute: 0.4 10*3/uL (ref 0.1–1.0)
Monocytes Relative: 7 %
Neutro Abs: 3.5 10*3/uL (ref 1.7–7.7)
Neutrophils Relative %: 57 %
Platelets: 307 10*3/uL (ref 150–400)
RBC: 3.66 MIL/uL — ABNORMAL LOW (ref 4.22–5.81)
RDW: 13.2 % (ref 11.5–15.5)
WBC: 6 10*3/uL (ref 4.0–10.5)
nRBC: 0 % (ref 0.0–0.2)

## 2024-03-30 LAB — SALICYLATE LEVEL: Salicylate Lvl: <7 mg/dL — ABNORMAL LOW (ref 7.0–30.0)

## 2024-03-30 LAB — RAPID URINE DRUG SCREEN, HOSP PERFORMED
Amphetamines: NOT DETECTED
Barbiturates: NOT DETECTED
Benzodiazepines: POSITIVE — AB
Cocaine: POSITIVE — AB
Opiates: NOT DETECTED
Tetrahydrocannabinol: NOT DETECTED

## 2024-03-30 LAB — ACETAMINOPHEN LEVEL: Acetaminophen (Tylenol), Serum: <10 ug/mL — ABNORMAL LOW (ref 10–30)

## 2024-03-30 LAB — CBG MONITORING, ED: Glucose-Capillary: 107 mg/dL — ABNORMAL HIGH (ref 70–99)

## 2024-03-30 MED ORDER — CEFADROXIL 500 MG PO CAPS
1000.0000 mg | ORAL_CAPSULE | Freq: Two times a day (BID) | ORAL | Status: DC
  Administered 2024-03-30 – 2024-04-01 (×5): 1000 mg via ORAL
  Filled 2024-03-30 (×7): qty 2

## 2024-03-30 MED ORDER — ONDANSETRON HCL 4 MG/2ML IJ SOLN
4.0000 mg | Freq: Once | INTRAMUSCULAR | Status: AC
  Administered 2024-03-30: 4 mg via INTRAVENOUS
  Filled 2024-03-30: qty 2

## 2024-03-30 MED ORDER — PALIPERIDONE ER 6 MG PO TB24
6.0000 mg | ORAL_TABLET | Freq: Every day | ORAL | Status: DC
  Administered 2024-03-30 – 2024-03-31 (×2): 6 mg via ORAL
  Filled 2024-03-30 (×2): qty 1

## 2024-03-30 NOTE — ED Triage Notes (Addendum)
 Pt bib gcems and picked up from side of road for overdose. When ems arrived bystanders were doing compressions on patients. When ems arrived patient had bilateral radial pulses. When ems provided narcan , approximately 10 minutes later patient came too.   2mg  narcan  IM given.  116/80 114 hr  16rr  Fresh surgical site appears to be on left hip* Bystanders report patients uses walker.  *EMS reports that patient made a comment and stated he wishes people would stop bringing him back.

## 2024-03-30 NOTE — ED Notes (Signed)
 CCMD notified, pt is on monitor.

## 2024-03-30 NOTE — Discharge Instructions (Addendum)
 You were seen in the ER today for your narcotic overdose.  Fortunately your blood work and vital signs are reassuring after you received Narcan .  You may return to the behavior health urgent care for long-term placement in a rehab facility for your substance use or you may follow-up with the outpatient resources listed below. Please continue your oral antibiotics for your hip and return to the ER with any new severe symptoms.   Your Duricef prescription was sent to the [facility] at [employer].

## 2024-03-30 NOTE — Progress Notes (Signed)
 Patient has been denied by [facility] due to no appropriate beds available. Patient meets BH inpatient criteria per Efrain Patient, NP. Patient has been faxed out to the following facilities:   [facility]  [address] [place] [place] [address] [phone]  [facility]  [address], [place] [address] [phone]  [facility]  [address] [place] [address] [phone]  [facility]  [address] [place] [address] [phone]  [facility]  [address], [place] [address] [phone]  [facility]  [address] [place], [place] [address] [phone]  [facility] Patient Placement  [facility] [place] [phone]  [facility]  [address] [place] [address] [phone]  [facility]  [address] [place] [phone]  [facility]  [address], [place] [address] [phone]  [facility]  [address] [place] [address] [phone]  [facility]  [address] [place] [address] [phone]  [facility]  [address], [place] [address] [phone]  [facility]  [address] [place], [place] [address] [phone]  [facility]  [address] [place] [address] [phone]  [facility]  [address], [place] [address] [phone]  [facility]  [address] [place] [address]  [phone]   Bunnie Gallop, MSW, LCSW-A  1:14 PM 03/30/2024

## 2024-03-30 NOTE — Progress Notes (Signed)
 CSW sent additional BH information to [facility] for continued review. CSW will continue to monitor the patient to secure recommended disposition.   Korbin Notaro, MSW, LCSW-A  5:46 PM 03/30/2024

## 2024-03-30 NOTE — ED Notes (Signed)
 Provided patient with sandwhich and drink.

## 2024-03-30 NOTE — Progress Notes (Signed)
 CSW faxed additional [facility] referral information to [facility] for review. CSW will continue to monitor the patient to secure recommended disposition.    Chanz Cahall, MSW, LCSW-A  1:34 PM 03/30/2024

## 2024-03-30 NOTE — ED Provider Notes (Signed)
 [place] EMERGENCY DEPARTMENT AT [facility] Provider Note   CSN: [id number] Arrival date & time: 03/30/24  9660     Patient presents with: Drug Overdose   Mark Hammond is a 33 y.o. male presenting EMSs narcotic overdose.  Patient was found down on the side of the road, EMS was called for reported cardiac arrest.  Bystander was performing CPR at time of EMS arrival the patient did have pulses.  Was bradypnea to 4 respirations per minute, rescue breaths were administered and 2 mg of Narcan  was given in the field by fire.  Patient was assisted with rescue breaths with BVM and in the ambulance and approximately tenderness after administration of Narcan  patient became alert and agitated, aggressive verbally with paramedics.  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  He remains on oral antibiotics at this time.  Patient was post to go to inpatient rehab at time of discharge, however patient opted not to.  Has been seen in the [facility] multiple times in the interim requesting placement in substance use rehab, for heroin addiction.  Has been discharged from the behavioral urgent care center for aggressive behavior towards staff.  At time of my arrival to the bedside patient is requesting residential substance use rehab placement.  Per chart review patient was discharged yesterday from behavioral health urgent care and transferred to [facility] due to needing higher level of care but the recommendation at that time was for psych reevaluation for long-term placement substance use facility.  It is unclear why he did not undergo repeat psych eval but was provided with outpatient resources at that time.   HPI     Prior to Admission medications   Medication Sig Start Date End Date Taking? Authorizing Provider  cefadroxil  (DURICEF) 500 MG capsule Take 2  capsules (1,000 mg total) by mouth 2 (two) times daily for 25 days. 03/28/24 04/22/24  Griselda Norris, MD    Allergies: Codeine, Fish allergy, and Shellfish allergy    Review of Systems  Constitutional:  Negative for fever.  Musculoskeletal:        L hip pain, post-op    Updated Vital Signs BP 125/84 (BP Location: Right Arm)   Pulse 94   Temp 97.9 F (36.6 C) (Oral)   Resp 14   Ht 5' 5 (1.651 m)   Wt 61.2 kg   SpO2 98%   BMI 22.47 kg/m   Physical Exam Vitals and nursing note reviewed.  Constitutional:      Appearance: He is not ill-appearing or toxic-appearing.  HENT:     Head: Normocephalic and atraumatic.     Mouth/Throat:     Mouth: Mucous membranes are moist.     Pharynx: No oropharyngeal exudate or posterior oropharyngeal erythema.   Eyes:     General:        Right eye: No discharge.        Left eye: No discharge.     Conjunctiva/sclera: Conjunctivae normal.    Cardiovascular:     Rate and Rhythm: Normal rate and regular rhythm.     Pulses: Normal pulses.     Heart sounds: Normal heart sounds. No murmur heard. Pulmonary:     Effort: Pulmonary effort is normal. No respiratory distress.     Breath sounds: Normal breath sounds. No wheezing or rales.  Abdominal:     General: Bowel sounds are  normal. There is no distension.     Tenderness: There is no abdominal tenderness.   Musculoskeletal:        General: No deformity.     Cervical back: Neck supple.     Right lower leg: No edema.     Left lower leg: No edema.       Legs:   Skin:    General: Skin is warm and dry.     Capillary Refill: Capillary refill takes less than 2 seconds.   Neurological:     General: No focal deficit present.     Mental Status: He is alert and oriented to person, place, and time. Mental status is at baseline.   Psychiatric:        Mood and Affect: Mood normal.     (all labs ordered are listed, but only abnormal results are displayed) Labs Reviewed  COMPREHENSIVE  METABOLIC PANEL WITH GFR - Abnormal; Notable for the following components:      Result Value   Potassium 3.3 (*)    Glucose, Bld 107 (*)    BUN 5 (*)    Creatinine, Ser 0.58 (*)    Calcium 8.1 (*)    AST 53 (*)    All other components within normal limits  SALICYLATE LEVEL - Abnormal; Notable for the following components:   Salicylate Lvl <7.0 (*)    All other components within normal limits  ACETAMINOPHEN  LEVEL - Abnormal; Notable for the following components:   Acetaminophen  (Tylenol ), Serum <10 (*)    All other components within normal limits  ETHANOL - Abnormal; Notable for the following components:   Alcohol, Ethyl (B) 243 (*)    All other components within normal limits  RAPID URINE DRUG SCREEN, HOSP PERFORMED - Abnormal; Notable for the following components:   Cocaine  POSITIVE (*)    Benzodiazepines POSITIVE (*)    All other components within normal limits  CBC WITH DIFFERENTIAL/PLATELET - Abnormal; Notable for the following components:   RBC 3.66 (*)    Hemoglobin 10.6 (*)    HCT 32.1 (*)    All other components within normal limits  CBG MONITORING, ED - Abnormal; Notable for the following components:   Glucose-Capillary 107 (*)    All other components within normal limits    EKG: None  Radiology: DG Pelvis Portable Result Date: 03/29/2024 CLINICAL DATA:  Left hip pain EXAM: PORTABLE PELVIS 1-2 VIEWS COMPARISON:  CT from 03/27/2024 FINDINGS: Again seen are changes of a left hip hemiarthroplasty. As noted on the CT from 03/27/2024 there is expansion of the acetabulum with mild protrusio deformity. Lateral subluxation of the left hip with partial uncovering of the aspect of the femoral head is similar to the exam from 03/27/2024. No signs of acute fracture noted. IMPRESSION: 1. Status post left hip hemiarthroplasty. 2. Lateral subluxation of the left hip with partial uncovering of the aspect of the femoral head is similar to the exam from 03/27/2024, but is new when  compared with 02/23/2024. Electronically Signed   By: Waddell Calk M.D.   On: 03/29/2024 07:39     Procedures   Medications Ordered in the ED - No data to display                                  Medical Decision Making 33 year old male presents with narcotic overdose responding to Narcan .  Patient vital Psoriasin in the emergency department, patient with  GCS of 15 at time of my arrival to the bedside, well-appearing.  Amount and/or Complexity of Data Reviewed Labs: ordered.    Details: CBC without leukocytosis, hemoglobin 10.6 and patient's baseline.  CMP with hypokalemia of 3.3, very transaminitis with AST of 53. UDS + for cocaine  benzos, + ETOH for 243.    ECG/medicine tests:     Details: EKG sinus rhythm   Patient with opiate overdose, treated with Narcan  in the field, making his airway throughout his stay in the emergency department, clinical concern for emergent underlying medical concern that would warrant further ED workup and patient management is exceedingly low.  Regarding the patient's request for placement in residential substance use facility, he does not appear to be exhibiting any psychiatric emergency at this time therefore we will hold off on a emergent psych eval in the ED.  He was again provided with outpatient resources and instructed he may follow-up with the behavioral urgent care if desired for assistance with placement in substance care facility.  Dainel  voiced understanding of his medical evaluation and treatment plan. Each of their questions answered to their expressed satisfaction.  Return precautions were given.  Patient is well-appearing, stable, and was discharged in good condition.  This chart was dictated using voice recognition software, Dragon. Despite the best efforts of this provider to proofread and correct errors, errors may still occur which can change documentation meaning.      Final diagnoses:  Accidental overdose of heroin, initial  encounter [facility])    ED Discharge Orders     None          Bobette Pleasant SAUNDERS, PA-C 03/30/24 0739    Theadore Ozell HERO, MD 04/03/24 [phone]

## 2024-03-30 NOTE — ED Notes (Addendum)
 Security wanded patient for weapons.

## 2024-03-30 NOTE — ED Provider Notes (Signed)
 Plan was for discharge.  Updated by nursing, patient endorsing SI.  Upon evaluation patient is endorsing SI with plan for overdose.   Physical Exam  BP (!) 103/54 (BP Location: Right Arm)   Pulse 83   Temp 97.9 F (36.6 C) (Oral)   Resp 15   Ht 5' 5 (1.651 m)   Wt 61.2 kg   SpO2 100%   BMI 22.47 kg/m   Physical Exam Vitals and nursing note reviewed.  Constitutional:      General: He is not in acute distress.    Appearance: He is well-developed.  HENT:     Head: Normocephalic and atraumatic.   Eyes:     Conjunctiva/sclera: Conjunctivae normal.    Cardiovascular:     Rate and Rhythm: Normal rate and regular rhythm.     Heart sounds: No murmur heard. Pulmonary:     Effort: Pulmonary effort is normal. No respiratory distress.     Breath sounds: Normal breath sounds.  Abdominal:     Palpations: Abdomen is soft.     Tenderness: There is no abdominal tenderness.   Musculoskeletal:        General: No swelling.     Cervical back: Neck supple.   Skin:    General: Skin is warm and dry.     Capillary Refill: Capillary refill takes less than 2 seconds.   Neurological:     Mental Status: He is alert.     Procedures  Procedures  ED Course / MDM    Medical Decision Making Amount and/or Complexity of Data Reviewed Labs: ordered.  Risk Prescription drug management.   Appears that he was just evaluated by psych with recommendations for inpatient care.  Plan initially was for transfer to [facility].  Unclear why this did not occur.  He is medically cleared we will put in TTS consult.  He is here voluntarily.  Psych evaluated and recommended inpatient.  Will resume cefadroxil  for osteo while here.     Donnajean Lynwood DEL, PA-C 03/30/24 1541    Emil Share, DO 03/31/24 [phone]

## 2024-03-30 NOTE — ED Notes (Signed)
 PT sitting upright in bed crying, advised this paramedic that he is having thoughts of harming himself and reports he plans to re-overdose in attempt to end his life. Pt changed into paper scrubs and all belongings removed. No weapons found. Security to wand patient.   PT Aox4, GCS 15.

## 2024-03-30 NOTE — Consult Note (Signed)
 [facility] Psychiatric Consult Initial  Patient Name: .Mark Hammond  MRN: [id number]  DOB: 05/07/1991  Consult Order details:  Orders (From admission, onward)     Start     Ordered   03/30/24 0918  CONSULT TO CALL ACT TEAM       Ordering Provider: Donnajean Lynwood DEL, PA-C  Provider:  (Not yet assigned)  Question:  Place call to:  Answer:  TTS   03/30/24 0917             Mode of Visit: In person    Psychiatry Consult Evaluation  Service Date: March 30, 2024 LOS:  LOS: 0 days  Chief Complaint I want detox and treatment  Primary Psychiatric Diagnoses  Suicidal Ideation 2.  Polysubstance Abuse 3. Schizoaffective Disorder  Assessment  Mark Hammond is a 33 y.o. male admitted: Presented to the [facility] 03/30/2024  3:39 AM brought in by ambulance after being found unresponsive post overdose. Received CPR by bystanders and narcan  from EMS personnel. 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   His current presentation of wanting to overdose without substance abuse treatment is most consistent with suicidal ideation. He meets criteria for inpatient psychiatric hospitalization based on being a danger to himself.  Current outpatient psychotropic medications include none per patient and historically he has had a N/A response to these medications. He was not compliant with medications prior to admission as evidenced by patient report that he didn't continue any psychiatric medications at discharge. On initial examination, patient is cooperative with assessment. Please see plan below for detailed recommendations.   Diagnoses:  Active Hospital problems: Principal Problem:   Suicidal ideation Active Problems:   Polysubstance (including opioids) dependence, daily use (HCC)    Plan   ## Psychiatric Medication  Recommendations:  Start --paliperidone 6mg  PO Q day   ## Medical Decision Making Capacity: Not specifically addressed in this encounter  ## Further Work-up:  -- most recent EKG on 03/30/2024 had QtC of 461 -- Pertinent labwork reviewed earlier this admission includes: CBC, CMP, acetaminophen , salicylate, alcohol and UDS   ## Disposition:-- We recommend inpatient psychiatric hospitalization when medically cleared. Patient is under voluntary admission status at this time; please IVC if attempts to leave hospital.and continues to endorse suicidal ideation.    ## Behavioral / Environmental: -Utilize compassion and acknowledge the patient's experiences while setting clear and realistic expectations for care.    ## Safety and Observation Level:  - Based on my clinical evaluation, I estimate the patient to be at high risk of self harm in the current setting. - At this time, we recommend  1:1 Observation. This decision is based on my review of the chart including patient's history and current presentation, interview of the patient, mental status examination, and consideration of suicide risk including evaluating suicidal ideation, plan, intent, suicidal or self-harm behaviors, risk factors, and protective factors. This judgment is based on our ability to directly address suicide risk, implement suicide prevention strategies, and develop a safety plan while the patient is in the clinical setting. Please contact our team if there is a concern that risk level has changed.  CSSR Risk Category:C-SSRS RISK CATEGORY: Error: Q2 is Yes, you must answer 3, 4, and 5  Suicide Risk Assessment: Patient has following modifiable risk factors for suicide: active suicidal ideation, recklessness, medication noncompliance, and recent psychiatric hospitalization, which  we are addressing by recommending inpatient psychiatric hospitalization. Patient has following non-modifiable or demographic risk factors for suicide:  male gender, history of suicide attempt, history of self harm behavior, and psychiatric hospitalization Patient has the following protective factors against suicide: Access to outpatient mental health care  Thank you for this consult request. Recommendations have been communicated to the primary team.  We will continue to follow at this time.   Mark Hammond Patient, NP       History of Present Illness  Relevant Aspects of Hospital ED Course:  Admitted on 03/30/2024 brought in by ambulance after being found unresponsive post overdose. Received CPR by bystanders and narcan  from EMS personnel. 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   Patient Report:  Mark Hammond, is seen face to face by this provider, consulted with Dr. Larina; and chart reviewed on 03/30/24.  On evaluation Mark Hammond reports he needs detox and long term treatment for substance abuse.  Patient endorses regularly experiencing hallucinations although he denies at this time.  Patient states if he leaves the hospital without treatment he is going to intentionally overdose again.  Patient told EMS personnel that he wishes they would stop bringing me back.    The patient says he feels he was lied to by previous providers when he was sent from [facility] to [facility].  He is angry he was discharged and this was a precipitating event to last nights intentional overdose.     Patient has had multiple emergency room and urgent care visits and behavioral health admission in the last year for psychiatric and substance abuse diagnoses. While there is certainly a secondary gain component to his use of emergency services, patient was found unconscious due to overdose less than 24 hours ago. As long as the patient continues to endorse active suicidal ideation, the recommendation  will remain inpatient treatment for detox and psychiatric stabilization.        Patient states he lives with his girlfriend, Mark Hammond.  He says she is very supportive of him.  He would not provide permission to talk to her, but states I will call her himself tomorrow.  During evaluation Mark Hammond is laying in bed in mild distress.  He is alert & oriented x 4, calm, cooperative and attentive for this assessment.  His mood is depressed with congruent affect.  He has normal speech and behavior.  Objectively there is evidence of psychosis/mania or delusional thinking. Pt does not appear to be responding to internal or external stimuli.  Patient is able to converse coherently with goal directed thoughts; he is distractible and at times appears pre-occupied. He denies current homicidal ideation, psychosis, and paranoia; he endorses suicidal/self-harm ideation.  Patient answered questions appropriately.    I personally spent a total of 60 minutes in the care of the patient today including preparing to see the patient, getting/reviewing separately obtained history, performing a medically appropriate exam/evaluation, counseling and educating, placing orders, referring and communicating with other health care professionals, documenting clinical information in the EHR, independently interpreting results, and coordinating care.  Psych ROS:  Depression: endorses Anxiety:  endorses Mania (lifetime and current): endorses Psychosis: (lifetime and current): endorses  Review of Systems  Musculoskeletal:  Positive for joint pain.       Osteomyelitis and Left hip replacement in 02/2024 - Still painful  Psychiatric/Behavioral:  Positive for depression, substance abuse and suicidal ideas.   All other systems reviewed and are negative.    Psychiatric and Social History  Psychiatric History:  Information collected from patient and chart review  Prev Dx/Sx: Schizoaffective disorder, mdd, polysubstance abuse,  anxiety Current Psych Provider: None Home Meds (current): None Previous Med Trials: Yes Therapy: None   Prior Psych Hospitalization: Yes Prior Self Harm: Yes Prior Violence: Denies   Family Psych History: Denies Family Hx suicide: Denies   Social History:  Developmental Hx: Unknown Educational Hx: Patient did not graduate high school Occupational Hx: Unemployed Legal Hx: Yes Living Situation: Lives with girlfriend Spiritual Hx: Yes Access to weapons/lethal means: Denies   Substance History Alcohol: Yes Type of alcohol any Last Drink unknown - blood alcohol is 243 on arrival Number of drinks per day unknown History of alcohol withdrawal seizures denies History of DT's denies Tobacco: Yes Illicit drugs: Yes Prescription drug abuse: Yes Rehab hx: Yes  Exam Findings  Physical Exam:  Vital Signs:  Temp:  [97.7 F (36.5 C)-97.9 F (36.6 C)] 97.9 F (36.6 C) (06/28 0708) Pulse Rate:  [72-94] 83 (06/28 0708) Resp:  [12-17] 15 (06/28 0708) BP: (100-125)/(54-84) 103/54 (06/28 0708) SpO2:  [98 %-100 %] 100 % (06/28 0708) Weight:  [61.2 kg] 61.2 kg (06/28 0350) Blood pressure (!) 103/54, pulse 83, temperature 97.9 F (36.6 C), temperature source Oral, resp. rate 15, height 5' 5 (1.651 m), weight 61.2 kg, SpO2 100%. Body mass index is 22.47 kg/m.  Physical Exam Vitals and nursing note reviewed.   Eyes:     Pupils: Pupils are equal, round, and reactive to light.   Pulmonary:     Effort: Pulmonary effort is normal.   Skin:    General: Skin is warm.   Neurological:     Mental Status: He is alert and oriented to person, place, and time.   Psychiatric:        Attention and Perception: He is inattentive.        Mood and Affect: Mood is depressed.        Speech: Speech normal.        Behavior: Behavior is agitated. Behavior is cooperative.        Thought Content: Thought content includes suicidal ideation. Thought content includes suicidal plan.        Cognition  and Memory: Cognition and memory normal.        Judgment: Judgment is impulsive.     Mental Status Exam: General Appearance: Disheveled  Orientation:  Full (Time, Place, and Person)  Memory:  Immediate;   Good Recent;   Good Remote;   Good  Concentration:  Concentration: Fair  Recall:  Good  Attention  Fair  Eye Contact:  Minimal  Speech:  Clear and Coherent  Language:  Fair  Volume:  Normal  Mood: depressed  Affect:  Congruent  Thought Process:  Goal Directed  Thought Content:  Logical  Suicidal Thoughts:  Yes.  with intent/plan  Homicidal Thoughts:  No  Judgement:  Impaired  Insight:  Lacking  Psychomotor Activity:  Restlessness  Akathisia:  No  Fund of Knowledge:  Fair      Assets:  Communication Skills Desire for Improvement Leisure Time Social Support  Cognition:  WNL  ADL's:  Intact  AIMS (if indicated):        Other History   These have been pulled in through the EMR, reviewed, and updated if appropriate.  Family History:  The patient's family history  includes Stroke in an other family member.  Medical History: Past Medical History:  Diagnosis Date   Alcoholic gastritis    Bipolar 1 disorder (HCC)    Dental abscess    Depression    Drug abuse (HCC)    ETOH abuse    Hallucination    Hypertension    Schizo affective schizophrenia (HCC)    Substance abuse (HCC)     Surgical History: Past Surgical History:  Procedure Laterality Date   TOTAL HIP ARTHROPLASTY Left 02/23/2024   Procedure: ARTHROPLASTY, HIP, TOTAL,POSTERIOR APPROACH;  Surgeon: Jerri Kay HERO, MD;  Location: [facility] OR;  Service: Orthopedics;  Laterality: Left;  LEFT POSTERIOR HIP RESECTION ARTHROPLASTY WITH ANTIBIOTIC SPACER     Medications:  No current facility-administered medications for this encounter.  Current Outpatient Medications:    cefadroxil  (DURICEF) 500 MG capsule, Take 2 capsules (1,000 mg total) by mouth 2 (two) times daily for 25 days., Disp: 100 capsule, Rfl:  0  Allergies: Allergies  Allergen Reactions   Codeine Other (See Comments)    Patient states parents told him he was allergic at a young age.   Fish Allergy Other (See Comments)    Patient does not wish to eat ANY fish- reaction unclear   Shellfish Allergy Other (See Comments)    Patient does not wish to eat ANY shellfish- reaction unclear    Mark Hammond Patient, NP

## 2024-03-31 ENCOUNTER — Encounter (HOSPITAL_COMMUNITY): Payer: Self-pay | Admitting: Psychiatry

## 2024-03-31 DIAGNOSIS — F1020 Alcohol dependence, uncomplicated: Secondary | ICD-10-CM

## 2024-03-31 DIAGNOSIS — F1120 Opioid dependence, uncomplicated: Secondary | ICD-10-CM

## 2024-03-31 DIAGNOSIS — F1420 Cocaine dependence, uncomplicated: Secondary | ICD-10-CM | POA: Diagnosis present

## 2024-03-31 DIAGNOSIS — F1994 Other psychoactive substance use, unspecified with psychoactive substance-induced mood disorder: Secondary | ICD-10-CM

## 2024-03-31 MED ORDER — MELATONIN 3 MG PO TABS
3.0000 mg | ORAL_TABLET | Freq: Every day | ORAL | Status: DC
  Administered 2024-03-31: 3 mg via ORAL
  Filled 2024-03-31: qty 1

## 2024-03-31 NOTE — ED Notes (Signed)
 Pt ambulated to BR without assistance. Pt did use bedside table to hop himself to the BR. Offered for him to use walker, pt stated he didn't need a walker. Insisted that a walker be used next time for safety purposes as a bedside table is not sturdy or steady enough to substitute for a hospital approved ambulation assistive device. Pt verbalized understanding.

## 2024-03-31 NOTE — ED Provider Notes (Signed)
 Emergency Medicine Observation Re-evaluation Note  Mark Hammond is a 33 y.o. male, seen on rounds today.  Pt initially presented to the ED for complaints of Drug Overdose Currently, the patient is calm and cooperative.  Physical Exam  BP 100/68   Pulse 66   Temp 98.5 F (36.9 C) (Oral)   Resp 18   Ht 5' 5 (1.651 m)   Wt 61.2 kg   SpO2 99%   BMI 22.47 kg/m  Physical Exam General: Awake. Alert. No acute distress Cardiac: Regular rate rhythm Lungs: Clear to auscultation bilaterally Psych: Calm and cooperative  ED Course / MDM  EKG:EKG Interpretation Date/Time:  Saturday March 30 2024 04:11:25 EDT Ventricular Rate:  95 PR Interval:  142 QRS Duration:  88 QT Interval:  366 QTC Calculation: 461 R Axis:   61  Text Interpretation: Sinus rhythm no acute ST/T changes similar to yesterday Confirmed by Freddi Hamilton [phone]) on 03/31/2024 7:11:36 AM  I have reviewed the labs performed to date as well as medications administered while in observation.  Recent changes in the last 24 hours include no acute events.  Plan  Current plan is for continued boarding in ED awaiting psychiatric placement.    Pamella Ozell LABOR, DO 03/31/24 1251

## 2024-03-31 NOTE — ED Notes (Signed)
 IVC paperwork in progress, waiting on the findings and custody

## 2024-03-31 NOTE — ED Notes (Signed)
 IVC paperwork complete and in purple zone, expires 04/07/24, case # [id number]

## 2024-03-31 NOTE — Progress Notes (Signed)
 Patient has been denied by [facility] due to no appropriate beds available. Patient meets BH inpatient criteria per Efrain Patient, NP. Patient has been faxed out to the following facilities:   [facility]  [address] [place] [place] [address] [phone]  [facility]  [address] [place] [address] [phone]  [facility]  [address] [place] [address] [phone]  [facility]  [address] [place] [address] [phone]  [facility]  [address], [place] [address] [phone]  [facility]  [address] [place], [place] [address] [phone]  [facility] Patient Placement  [facility], [place] [phone]  [facility]  [address] [place] [address] [phone]  [facility]  [address] [place] [phone]  [facility]  [address], [place] [address] [phone]  [facility]  [address] [place] [address] [phone]  [facility]  [address] [place] [address] [phone]  [facility]  [address], [place] [address] [phone]  [facility]  [address] [place], [place] [address] [phone]  [facility]  [address] [place] [address] [phone]  [facility]  [address], [place] [address] [phone]  [facility]  [address], [place] [address]  [phone]    Bunnie Gallop, MSW, LCSW-A  9:52 AM 03/31/2024

## 2024-03-31 NOTE — Consult Note (Signed)
 [facility] Psychiatric Consult Initial  Patient Name: .Mark Hammond  MRN: [id number]  DOB: [birth year]  Consult Order details:  Orders (From admission, onward)     Start     Ordered   03/30/24 0918  CONSULT TO CALL ACT TEAM       Ordering Provider: Donnajean Lynwood DEL, PA-C  Provider:  (Not yet assigned)  Question:  Place call to:  Answer:  TTS   03/30/24 0917             Mode of Visit: In person    Psychiatry Consult Evaluation  Service Date: March 31, 2024 LOS:  LOS: 0 days  Chief Complaint I want detox and treatment  Primary Psychiatric Diagnoses   Substance-induced mood disorder Alcohol use disorder severe dependence Heroin use disorder severe Cocaine  use disorder severe dependence  R/O Malingering   Assessment  Mark Hammond is a 33 y.o. male admitted: Presented to the [facility] 03/30/2024  3:39 AM brought in by ambulance after being found unresponsive post overdose. Received CPR by bystanders and narcan  from EMS personnel. 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   His current presentation of wanting to overdose without substance abuse treatment is most consistent with suicidal ideation. He meets criteria for inpatient psychiatric hospitalization based on being a danger to himself.  Current outpatient psychotropic medications include none per patient and historically he has had a N/A response to these medications. He was not compliant with medications prior to admission as evidenced by patient report that he didn't continue any psychiatric medications at discharge. On initial examination, patient is cooperative with assessment. Please see plan below for detailed recommendations.   03/31/2024  Upon reevaluation, patient endorses that his recent overdose on illicit substances which led to this  encounter was actually unintentional, and that he only endorsed that his unintentional overdose was intentional, in hopes of obtaining temporary housing.  Patient additionally retracts previous endorsements of struggling with depression, and having suicidal thoughts, and states that he no longer has a desire to participate in mental health or substance abuse treatment, but would rather discharge to a different part of town, away from all the drugs, as he lives homeless.   Discussed with patient that given the recent events that transpired, recommendation would remain firmly at this time for inpatient mental health hospitalization, for safety and stabilization of the patient, but that during serial evaluations, until placement is found, his endorsements today of malingering would be taken into consideration, to which patient endorsed he understood.   While the patient is endorsing he lied for secondary gain, as it relates to obtaining temporary housing, the patient still had a serious overdose attempt, of which if was actually a suicide attempt, requires at the very least, additional time under observations, while patient awaits placement under IVC.  CSW team remains vigilant towards seeking disposition for the patient, though barrier of the patient not having insurance, does make things currently difficult.  Diagnoses:  Active Hospital problems: Principal Problem:   Substance induced mood disorder (HCC) Active Problems:   Alcohol use disorder, severe, dependence (HCC)   Heroin use disorder, severe (HCC)   Cocaine  use disorder, severe, dependence (HCC)    Plan   ## Psychiatric Medication Recommendations:   - Recommend discontinue paliperidone 6mg  PO Q day  ## Medical Decision Making Capacity: Not specifically addressed in this  encounter  ## Further Work-up: None at this time  ## Disposition:--Recommend inpatient mental health hospitalization under involuntary commitment  ## Behavioral  / Environmental: -Strict agitation/safety precautions   ## Safety and Observation Level:  - Based on my clinical evaluation, I estimate the patient to be at low risk of self harm in the current setting. - At this time, we recommend  1:1 Observation. This decision is based on my review of the chart including patient's history and current presentation, interview of the patient, mental status examination, and consideration of suicide risk including evaluating suicidal ideation, plan, intent, suicidal or self-harm behaviors, risk factors, and protective factors. This judgment is based on our ability to directly address suicide risk, implement suicide prevention strategies, and develop a safety plan while the patient is in the clinical setting. Please contact our team if there is a concern that risk level has changed.  CSSR Risk Category:C-SSRS RISK CATEGORY: High Risk  Suicide Risk Assessment: Patient has following modifiable risk factors for suicide: active suicidal ideation, recklessness, medication noncompliance, and recent psychiatric hospitalization, which we are addressing by recommending inpatient psychiatric hospitalization. Patient has following non-modifiable or demographic risk factors for suicide: male gender, history of suicide attempt, history of self harm behavior, and psychiatric hospitalization Patient has the following protective factors against suicide: Access to outpatient mental health care  Thank you for this consult request. Recommendations have been communicated to the primary team.  We will continue to follow at this time.   Jerel JINNY Gravely, NP       History of Present Illness  Relevant Aspects of Hospital ED Course:  Admitted on 03/30/2024 brought in by ambulance after being found unresponsive post overdose. Received CPR by bystanders and narcan  from EMS personnel. 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   Patient Report:  Mark Hammond, is seen face to face by this provider, consulted with Dr. Larina; and chart reviewed on 03/30/24.  On evaluation Gabriella Guile reports he needs detox and long term treatment for substance abuse.  Patient endorses regularly experiencing hallucinations although he denies at this time.  Patient states if he leaves the hospital without treatment he is going to intentionally overdose again.  Patient told EMS personnel that he wishes they would stop bringing me back.    The patient says he feels he was lied to by previous providers when he was sent from [facility] to [facility].  He is angry he was discharged and this was a precipitating event to last nights intentional overdose.     Patient has had multiple emergency room and urgent care visits and behavioral health admission in the last year for psychiatric and substance abuse diagnoses. While there is certainly a secondary gain component to his use of emergency services, patient was found unconscious due to overdose less than 24 hours ago. As long as the patient continues to endorse active suicidal ideation, the recommendation will remain inpatient treatment for detox and psychiatric stabilization.        Patient states he lives with his girlfriend, Clarnce.  He says she is very supportive of him.  He would not provide permission to talk to her, but states I will call her himself tomorrow.  During evaluation Massie Mees is laying in bed in mild distress.  He is alert & oriented x 4, calm, cooperative and attentive  for this assessment.  His mood is depressed with congruent affect.  He has normal speech and behavior.  Objectively there is evidence of psychosis/mania or delusional thinking. Pt does not appear to be responding to internal or external stimuli.  Patient is able to converse coherently with goal directed thoughts;  he is distractible and at times appears pre-occupied. He denies current homicidal ideation, psychosis, and paranoia; he endorses suicidal/self-harm ideation.  Patient answered questions appropriately.    I personally spent a total of 60 minutes in the care of the patient today including preparing to see the patient, getting/reviewing separately obtained history, performing a medically appropriate exam/evaluation, counseling and educating, placing orders, referring and communicating with other health care professionals, documenting clinical information in the EHR, independently interpreting results, and coordinating care.  03/31/2024  Patient seen today at the [facility] emergency department for face-to-face psychiatric reevaluation.  Upon reevaluation, patient endorses an euthymic mood with a largely congruent interpersonal style and affect, with fair eye contact.   Patient endorses no suicidal and or homicidal ideations, and then proceeds to notably share and expand, that this encounter he has never been suicidal, but rather endorsed this encounter that he was suicidal, and that his overdose attempt on illicit substances was intentional, in hopes of obtaining temporary housing and shelter, by being admitted for another inpatient mental health and substance abuse treatment stay, due to his endorsements.  Patient denies any current struggles with depression and/or anxiety, and then also expands further to state that this encounter he also lied about struggling with depression and anxiety, and then states he feels that he would be safe to discharge, and would like to be discharged to, a different part of town, away from all the drugs, as he lives homeless.   Patient endorses he has not been struggling with sleep or eating either, as well as states that since he has been here, has been able to sleep and eat well.  Patient endorses recent substance abuse just prior to coming in involved heroin, cocaine ,  and EtOH; unable to quantify how much of each of the aforementioned he used, just states, a lot.   Patient endorses that he does not have a desire to address his mental health and/or substance abuse, despite given lots of encouragement with her extensive conversation.  Patient orientation is intact, no concerns for fluctuations in consciousness.  Patient denies any auditory or visual hallucinations, and objectively, does not appear to be presenting with psychotic features.  Discussed with patient that while he gives endorsement today of malingering for secondary gain, given the recent events that transpired, recommendation would remain for inpatient mental health hospitalization, but now under involuntary commitment, though through serial evaluations while placement is being sought, this will be taken into consideration.  Patient verbalized understanding.  Chart review/nursing:   Patient has been eating and sleeping appropriately.  Patient has been compliant with medications.  Patient has not been presenting with any safety concerns.  Patient has endorsed that he wants to discharge and no longer has a desire to participate in mental health and/or substance abuse treatment.  Psych ROS:  Depression: endorses Anxiety:  endorses Mania (lifetime and current): endorses Psychosis: (lifetime and current): endorses  Review of Systems  Gastrointestinal:  Negative for abdominal pain, constipation, diarrhea, nausea and vomiting.  Musculoskeletal:  Positive for joint pain.       Osteomyelitis and Left hip replacement in 02/2024 - Still painful  Neurological:  Negative for dizziness, tremors, seizures, loss of  consciousness and headaches.  Psychiatric/Behavioral:  Positive for substance abuse (Herion, cocaine , etoh). Negative for depression, hallucinations and suicidal ideas. The patient is not nervous/anxious and does not have insomnia.   All other systems reviewed and are negative.    Psychiatric and  Social History  Psychiatric History:  Information collected from patient and chart review  Prev Dx/Sx: Schizoaffective disorder, mdd, polysubstance abuse, anxiety Current Psych Provider: None Home Meds (current): None Previous Med Trials: Yes Therapy: None   Prior Psych Hospitalization: Yes Prior Self Harm: Yes Prior Violence: Denies   Family Psych History: Denies Family Hx suicide: Denies   Social History:  Developmental Hx: Unknown Educational Hx: Patient did not graduate high school Occupational Hx: Set designer Hx: Yes Living Situation: Lives with girlfriend Spiritual Hx: Yes Access to weapons/lethal means: Denies   Substance History Alcohol: Yes Type of alcohol any Last Drink unknown - blood alcohol is 243 on arrival Number of drinks per day unknown History of alcohol withdrawal seizures denies History of DT's denies Tobacco: Yes Illicit drugs: Yes Prescription drug abuse: Yes Rehab hx: Yes  Exam Findings  Physical Exam: As below Vital Signs:  Temp:  [98.5 F (36.9 C)-98.9 F (37.2 C)] 98.5 F (36.9 C) (06/29 0745) Pulse Rate:  [66-96] 66 (06/29 0745) Resp:  [18-20] 18 (06/29 0745) BP: (100-105)/(64-68) 100/68 (06/29 0745) SpO2:  [99 %-100 %] 99 % (06/29 0745) Blood pressure 100/68, pulse 66, temperature 98.5 F (36.9 C), temperature source Oral, resp. rate 18, height 5' 5 (1.651 m), weight 61.2 kg, SpO2 99%. Body mass index is 22.47 kg/m.  Physical Exam Vitals and nursing note reviewed.  Constitutional:      General: He is not in acute distress.    Appearance: Normal appearance. He is not ill-appearing, toxic-appearing or diaphoretic.   Eyes:     Pupils: Pupils are equal, round, and reactive to light.   Pulmonary:     Effort: Pulmonary effort is normal.   Skin:    General: Skin is warm and dry.   Neurological:     Mental Status: He is alert and oriented to person, place, and time.     Motor: No weakness, tremor or seizure activity.    Psychiatric:        Attention and Perception: Attention and perception normal. He does not perceive auditory or visual hallucinations.        Mood and Affect: Mood and affect normal.        Speech: Speech normal.        Behavior: Behavior is cooperative.        Thought Content: Thought content does not include suicidal ideation.        Cognition and Memory: Cognition and memory normal.        Judgment: Judgment is impulsive and inappropriate.     Mental Status Exam: General Appearance: Casual  Orientation:  Full (Time, Place, and Person)  Memory:  Immediate;   Good Recent;   Good Remote;   Good  Concentration:  Concentration: Fair  Recall:  Good  Attention  Fair  Eye Contact:  fair  Speech:  Clear and Coherent  Language:  Fair  Volume:  Normal  Mood: I'm good now  Affect:  Congruent  Thought Process:  Goal Directed  Thought Content:  Logical  Suicidal Thoughts:  No  Homicidal Thoughts:  No  Judgement: Impulsive and inappropriate  Insight:  Lacking  Psychomotor Activity: Normal  Akathisia:  No  Fund of Knowledge:  Fair  Assets:  Communication Skills Desire for Improvement Leisure Time Social Support  Cognition:  WNL  ADL's:  Intact  AIMS (if indicated):   0     Other History   These have been pulled in through the EMR, reviewed, and updated if appropriate.  Family History:  The patient's family history includes Stroke in an other family member.  Medical History: Past Medical History:  Diagnosis Date  . Alcoholic gastritis   . Bipolar 1 disorder (HCC)   . Dental abscess   . Depression   . Drug abuse (HCC)   . ETOH abuse   . Hallucination   . Hypertension   . Schizo affective schizophrenia (HCC)   . Substance abuse [facility])     Surgical History: Past Surgical History:  Procedure Laterality Date  . TOTAL HIP ARTHROPLASTY Left 02/23/2024   Procedure: ARTHROPLASTY, HIP, TOTAL,POSTERIOR APPROACH;  Surgeon: Jerri Kay HERO, MD;  Location: [facility] OR;   Service: Orthopedics;  Laterality: Left;  LEFT POSTERIOR HIP RESECTION ARTHROPLASTY WITH ANTIBIOTIC SPACER     Medications:   Current Facility-Administered Medications:  .  cefadroxil  (DURICEF) capsule 1,000 mg, 1,000 mg, Oral, BID, Templeton, James H, PA-C, 1,000 mg at 03/31/24 0940 .  paliperidone (INVEGA) 24 hr tablet 6 mg, 6 mg, Oral, Daily, Weber, Kyra A, NP, 6 mg at 03/31/24 0940  Current Outpatient Medications:  .  cefadroxil  (DURICEF) 500 MG capsule, Take 2 capsules (1,000 mg total) by mouth 2 (two) times daily for 25 days., Disp: 100 capsule, Rfl: 0  Allergies: Allergies  Allergen Reactions  . Codeine Other (See Comments)    Patient states parents told him he was allergic at a young age.  . Fish Allergy Other (See Comments)    Patient does not wish to eat ANY fish- reaction unclear  . Shellfish Allergy Other (See Comments)    Patient does not wish to eat ANY shellfish- reaction unclear    Jerel JINNY Gravely, NP

## 2024-03-31 NOTE — ED Notes (Signed)
 Breakfast tray delivered

## 2024-04-01 MED ORDER — ACETAMINOPHEN 325 MG PO TABS
650.0000 mg | ORAL_TABLET | Freq: Once | ORAL | Status: AC
  Administered 2024-04-01: 650 mg via ORAL
  Filled 2024-04-01: qty 2

## 2024-04-01 MED ORDER — QUETIAPINE FUMARATE 100 MG PO TABS: 100.0000 mg | ORAL_TABLET | Freq: Every day | ORAL | Status: DC

## 2024-04-01 NOTE — ED Notes (Signed)
 Case Number:[id number]  IVC documents are complete and located in the purple zone.   IVC'd: 03/31/2024 IVC expires on 04/07/2024

## 2024-04-01 NOTE — Consult Note (Signed)
  Pt requesting reevaluation due to him now requesting discharge. Earlier, patient denied SI/HI/AVH but was seeking substance abuse treatment and requesting to remain in the hospital. However, he has now decided to change his mind and is requesting to leave.   Pt has been seen by [facility] service line numerous times over the past few months, and there is documented concerns for malingering and secondary gain. Pt was sent to [facility], the detox unit, on 03/28/24 but then refused to participate and requested discharge. Pt has been given follow up instructions and has not followed up with any resources provided. On this encounter patient presented with overdose, he originally stated it was intentional, but has now been saying for the last two days he lied in order to obtain temporary housing at the hospital. With patient requesting discharge today, I do not feel he meets criteria for IVC and is able to be psych cleared for discharge.   He is able to contract for safety. States he no longer wants to wait for substance abuse treatment. Denies access to weapons or firearms. During evaluation Mark Hammond is sitting in no acute distress.  He is alert, oriented x 4, calm, cooperative and attentive.  His mood is euthymic with congruent affect.  He has normal speech, and behavior.  Objectively there is no evidence of psychosis/mania or delusional thinking.  Patient is able to converse coherently, goal directed thoughts, no distractibility, or pre-occupation.  He also denies suicidal/self-harm/homicidal ideation, psychosis, and paranoia.  Patient answered question appropriately.

## 2024-04-01 NOTE — ED Provider Notes (Addendum)
 Patient cleared by behavioral health for discharge.  IVC will be rescinded   Jesscia Imm, MD 04/01/24 1459  IVC resending paperwork signed.  And patient's Duricef prescription ordered.  And sent to the [facility] on [address].   Muaz Shorey, MD 04/01/24 [phone]

## 2024-04-01 NOTE — ED Notes (Signed)
 All belongings in locker returned to patient for discharge. Pt A+Ox4, respirations even and unlabored, ambulatory with walker, and denies SI/HI/AVH at this time.

## 2024-04-01 NOTE — ED Notes (Signed)
 Ed paper work and education reviewed with pt. Pt is leaving in no new onset distress. Pt leaving for home with bus pass.

## 2024-04-01 NOTE — ED Notes (Signed)
 Ivc paperwork current waiting on abed to transfer pt

## 2024-04-01 NOTE — ED Notes (Signed)
 Meal tray at bedside.

## 2024-04-01 NOTE — ED Provider Notes (Signed)
 Emergency Medicine Observation Re-evaluation Note  Mark Hammond is a 33 y.o. male, seen on rounds today.  Pt initially presented to the ED for complaints of Drug Overdose Currently, the patient is awaiting placement possibly to [facility].  Patient evaluated for substance abuse and some questionable suicidal thoughts..  Physical Exam  BP 114/78 (BP Location: Left Arm)   Pulse 81   Temp 98.6 F (37 C) (Oral)   Resp 18   Ht 1.651 m (5' 5)   Wt 61.2 kg   SpO2 99%   BMI 22.47 kg/m  Physical Exam General: Resting comfortably Cardiac:  Lungs: No respiratory distress Psych: Baseline  ED Course / MDM  EKG:EKG Interpretation Date/Time:  Saturday March 30 2024 04:11:25 EDT Ventricular Rate:  95 PR Interval:  142 QRS Duration:  88 QT Interval:  366 QTC Calculation: 461 R Axis:   61  Text Interpretation: Sinus rhythm no acute ST/T changes similar to yesterday Confirmed by Mark Hammond [phone]) on 03/31/2024 7:11:36 AM  I have reviewed the labs performed to date as well as medications administered while in observation.  Recent changes in the last 24 hours include looking for placement at [facility].  Plan  Current plan is for placement for substance abuse and questionable suicidal thoughts.    Mark Witt, MD 04/01/24 0830

## 2024-04-01 NOTE — ED Notes (Signed)
Pt ambulatory to restroom with walker 

## 2024-04-01 NOTE — ED Notes (Signed)
 Spoke with Elenor, RN regarding possible admission to [facility]. Report given to RN, plan to follow up with [facility] regarding admission.

## 2024-04-01 NOTE — ED Notes (Signed)
 Pt states he doesn't want to be here anymore and would like to be discharged. NP aware.

## 2024-04-01 NOTE — Consult Note (Signed)
  Psychiatric Consult follow up  Patient Name: .Mark Hammond  MRN: [id number]  DOB: November 05, 1990  Consult Order details:  Orders (From admission, onward)     Start     Ordered   03/30/24 0918  CONSULT TO CALL ACT TEAM       Ordering Provider: Donnajean Lynwood DEL, PA-C  Provider:  (Not yet assigned)  Question:  Place call to:  Answer:  TTS   03/30/24 0917             Mode of Visit: In person    Psychiatry Consult Evaluation  Service Date: April 01, 2024 LOS: 4 days Chief Complaint I want detox and treatment  Primary Psychiatric Diagnoses   Substance-induced mood disorder Alcohol use disorder severe dependence Heroin use disorder severe Cocaine  use disorder severe dependence  R/O Malingering   Assessment  Mark Hammond is a 33 y.o. male admitted: Presented to the [facility] 03/30/2024  3:39 AM brought in by ambulance after being found unresponsive post overdose. Received CPR by bystanders and narcan  from EMS personnel. 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   His current presentation of wanting to overdose without substance abuse treatment is most consistent with suicidal ideation. He meets criteria for inpatient psychiatric hospitalization based on being a danger to himself.  Current outpatient psychotropic medications include none per patient and historically he has had a N/A response to these medications. He was not compliant with medications prior to admission as evidenced by patient report that he didn't continue any psychiatric medications at discharge. On initial examination, patient is cooperative with assessment. Please see plan below for detailed recommendations.   04/01/2024  Upon reevaluation, patient is pleasant and is now wanting to remain in the hospital and to continue seeking  treatment. Pt is wanting to get back on his Seroquel , and get inpatient treatment for mental health and substance abuse. Will restart Seroquel  at 100 mg at bedtime. Denies HI. Denies AVH. Does endorse withdrawal symptoms of excessive yawning.   CSW team remains vigilant towards seeking disposition for the patient, though barrier of the patient not having insurance as well as using a walker to ambulate, which does make things currently difficult. Pt is currently being reviewed by different facilities such as [facility] and [facility]. [facility] has no capacity and has declined at this time.   Diagnoses:  Active Hospital problems: Principal Problem:   Substance induced mood disorder (HCC) Active Problems:   Alcohol use disorder, severe, dependence (HCC)   Heroin use disorder, severe (HCC)   Cocaine  use disorder, severe, dependence (HCC)    Plan   ## Psychiatric Medication Recommendations:   - Start Seroquel  100 mg Qhs  ## Medical Decision Making Capacity: Not specifically addressed in this encounter  ## Further Work-up: None at this time  ## Disposition:--Recommend inpatient mental health hospitalization under involuntary commitment  ## Behavioral / Environmental: -Strict agitation/safety precautions   ## Safety and Observation Level:  - Based on my clinical evaluation, I estimate the patient to be at low risk of self harm in the current setting. - At this time, we recommend  1:1 Observation. This decision is based on my review of the chart including patient's history and current presentation, interview of the patient, mental status examination, and consideration of suicide risk including evaluating suicidal ideation, plan, intent, suicidal or self-harm behaviors,  risk factors, and protective factors. This judgment is based on our ability to directly address suicide risk, implement suicide prevention strategies, and develop a safety plan while the patient is in the clinical setting. Please  contact our team if there is a concern that risk level has changed.  CSSR Risk Category:C-SSRS RISK CATEGORY: High Risk  Suicide Risk Assessment: Patient has following modifiable risk factors for suicide: active suicidal ideation, recklessness, medication noncompliance, and recent psychiatric hospitalization, which we are addressing by recommending inpatient psychiatric hospitalization. Patient has following non-modifiable or demographic risk factors for suicide: male gender, history of suicide attempt, history of self harm behavior, and psychiatric hospitalization Patient has the following protective factors against suicide: Access to outpatient mental health care  Thank you for this consult request. Recommendations have been communicated to the primary team.  We will continue to follow at this time.   Nash Batter, NP       History of Present Illness  Relevant Aspects of Hospital ED Course:  Admitted on 03/30/2024 brought in by ambulance after being found unresponsive post overdose. Received CPR by bystanders and narcan  from EMS personnel. 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   Patient Report:  Mark Hammond, is seen face to face by this provider, consulted with Dr. Larina; and chart reviewed on 03/30/24.  On evaluation Mark Hammond reports he needs detox and long term treatment for substance abuse.  Patient endorses regularly experiencing hallucinations although he denies at this time.  Patient states if he leaves the hospital without treatment he is going to intentionally overdose again.  Patient told EMS personnel that he wishes they would stop bringing me back.    The patient says he feels he was lied to by previous providers when he was sent from [facility] to [facility].  He is angry he was discharged and  this was a precipitating event to last nights intentional overdose.     Patient has had multiple emergency room and urgent care visits and behavioral health admission in the last year for psychiatric and substance abuse diagnoses. While there is certainly a secondary gain component to his use of emergency services, patient was found unconscious due to overdose less than 24 hours ago. As long as the patient continues to endorse active suicidal ideation, the recommendation will remain inpatient treatment for detox and psychiatric stabilization.        Patient states he lives with his girlfriend, Mark Hammond.  He says she is very supportive of him.  He would not provide permission to talk to her, but states I will call her himself tomorrow.  During evaluation Mark Hammond is laying in bed in mild distress.  He is alert & oriented x 4, calm, cooperative and attentive for this assessment.  His mood is depressed with congruent affect.  He has normal speech and behavior.  Objectively there is evidence of psychosis/mania or delusional thinking. Pt does not appear to be responding to internal or external stimuli.  Patient is able to converse coherently with goal directed thoughts; he is distractible and at times appears pre-occupied. He denies current homicidal ideation, psychosis, and paranoia; he endorses suicidal/self-harm ideation.  Patient answered questions appropriately.    I personally spent a total of 60 minutes in the care of the patient today including preparing to see the patient, getting/reviewing separately obtained history,  performing a medically appropriate exam/evaluation, counseling and educating, placing orders, referring and communicating with other health care professionals, documenting clinical information in the EHR, independently interpreting results, and coordinating care.  03/31/2024  Patient seen today at the [facility] emergency department for face-to-face psychiatric reevaluation.  Upon  reevaluation, patient endorses an euthymic mood with a largely congruent interpersonal style and affect, with fair eye contact.   Patient endorses no suicidal and or homicidal ideations, and then proceeds to notably share and expand, that this encounter he has never been suicidal, but rather endorsed this encounter that he was suicidal, and that his overdose attempt on illicit substances was intentional, in hopes of obtaining temporary housing and shelter, by being admitted for another inpatient mental health and substance abuse treatment stay, due to his endorsements.  Patient denies any current struggles with depression and/or anxiety, and then also expands further to state that this encounter he also lied about struggling with depression and anxiety, and then states he feels that he would be safe to discharge, and would like to be discharged to, a different part of town, away from all the drugs, as he lives homeless.   Patient endorses he has not been struggling with sleep or eating either, as well as states that since he has been here, has been able to sleep and eat well.  Patient endorses recent substance abuse just prior to coming in involved heroin, cocaine , and EtOH; unable to quantify how much of each of the aforementioned he used, just states, a lot.   Patient endorses that he does not have a desire to address his mental health and/or substance abuse, despite given lots of encouragement with her extensive conversation.  Patient orientation is intact, no concerns for fluctuations in consciousness.  Patient denies any auditory or visual hallucinations, and objectively, does not appear to be presenting with psychotic features.  Discussed with patient that while he gives endorsement today of malingering for secondary gain, given the recent events that transpired, recommendation would remain for inpatient mental health hospitalization, but now under involuntary commitment, though through serial  evaluations while placement is being sought, this will be taken into consideration.  Patient verbalized understanding.   Psych ROS:  Depression: endorses Anxiety:  endorses Mania (lifetime and current): endorses Psychosis: (lifetime and current): endorses  Review of Systems  Gastrointestinal:  Negative for abdominal pain, constipation, diarrhea, nausea and vomiting.  Musculoskeletal:  Positive for joint pain.       Osteomyelitis and Left hip replacement in 02/2024 - Still painful  Neurological:  Negative for dizziness, tremors, seizures, loss of consciousness and headaches.  Psychiatric/Behavioral:  Positive for substance abuse (Herion, cocaine , etoh). Negative for depression, hallucinations and suicidal ideas. The patient is not nervous/anxious and does not have insomnia.   All other systems reviewed and are negative.    Psychiatric and Social History  Psychiatric History:  Information collected from patient and chart review  Prev Dx/Sx: Schizoaffective disorder, mdd, polysubstance abuse, anxiety Current Psych Provider: None Home Meds (current): None Previous Med Trials: Yes Therapy: None   Prior Psych Hospitalization: Yes Prior Self Harm: Yes Prior Violence: Denies   Family Psych History: Denies Family Hx suicide: Denies   Social History:  Developmental Hx: Unknown Educational Hx: Patient did not graduate high school Occupational Hx: Unemployed Legal Hx: Yes Living Situation: Lives with girlfriend Spiritual Hx: Yes Access to weapons/lethal means: Denies   Substance History Alcohol: Yes Type of alcohol any Last Drink unknown - blood alcohol is 243  on arrival Number of drinks per day unknown History of alcohol withdrawal seizures denies History of DT's denies Tobacco: Yes Illicit drugs: Yes Prescription drug abuse: Yes Rehab hx: Yes  Exam Findings  Physical Exam: As below Vital Signs:  Temp:  [98.1 F (36.7 C)-98.6 F (37 C)] 98.2 F (36.8 C) (06/30  0847) Pulse Rate:  [64-81] 64 (06/30 0847) Resp:  [16-18] 18 (06/30 0847) BP: (103-114)/(63-78) 104/63 (06/30 0847) SpO2:  [98 %-99 %] 99 % (06/30 0847) Blood pressure 104/63, pulse 64, temperature 98.2 F (36.8 C), temperature source Oral, resp. rate 18, height 5' 5 (1.651 m), weight 61.2 kg, SpO2 99%. Body mass index is 22.47 kg/m.  Physical Exam Vitals and nursing note reviewed.  Constitutional:      General: He is not in acute distress.    Appearance: Normal appearance. He is not ill-appearing, toxic-appearing or diaphoretic.   Eyes:     Pupils: Pupils are equal, round, and reactive to light.   Pulmonary:     Effort: Pulmonary effort is normal.   Skin:    General: Skin is warm and dry.   Neurological:     Mental Status: He is alert and oriented to person, place, and time.     Motor: No weakness, tremor or seizure activity.   Psychiatric:        Attention and Perception: Attention and perception normal. He does not perceive auditory or visual hallucinations.        Mood and Affect: Mood and affect normal.        Speech: Speech normal.        Behavior: Behavior is cooperative.        Thought Content: Thought content does not include suicidal ideation.        Cognition and Memory: Cognition and memory normal.        Judgment: Judgment is impulsive and inappropriate.     Mental Status Exam: General Appearance: Casual  Orientation:  Full (Time, Place, and Person)  Memory:  Immediate;   Good Recent;   Good Remote;   Good  Concentration:  Concentration: Fair  Recall:  Good  Attention  Fair  Eye Contact:  fair  Speech:  Clear and Coherent  Language:  Fair  Volume:  Normal  Mood: I'm good now  Affect:  Congruent  Thought Process:  Goal Directed  Thought Content:  Logical  Suicidal Thoughts:  No  Homicidal Thoughts:  No  Judgement: Impulsive and inappropriate  Insight:  Lacking  Psychomotor Activity: Normal  Akathisia:  No  Fund of Knowledge:  Fair       Assets:  Communication Skills Desire for Improvement Leisure Time Social Support  Cognition:  WNL  ADL's:  Intact  AIMS (if indicated):   0     Other History   These have been pulled in through the EMR, reviewed, and updated if appropriate.  Family History:  The patient's family history includes Stroke in an other family member.  Medical History: Past Medical History:  Diagnosis Date   Alcoholic gastritis    Bipolar 1 disorder (HCC)    Dental abscess    Depression    Drug abuse (HCC)    ETOH abuse    Hallucination    Hypertension    Schizo affective schizophrenia (HCC)    Substance abuse (HCC)     Surgical History: Past Surgical History:  Procedure Laterality Date   TOTAL HIP ARTHROPLASTY Left 02/23/2024   Procedure: ARTHROPLASTY, HIP, TOTAL,POSTERIOR APPROACH;  Surgeon:  Jerri Kay HERO, MD;  Location: [facility] OR;  Service: Orthopedics;  Laterality: Left;  LEFT POSTERIOR HIP RESECTION ARTHROPLASTY WITH ANTIBIOTIC SPACER     Medications:   Current Facility-Administered Medications:    acetaminophen  (TYLENOL ) tablet 650 mg, 650 mg, Oral, Once, Zackowski, Scott, MD   cefadroxil  (DURICEF) capsule 1,000 mg, 1,000 mg, Oral, BID, Templeton, James H, PA-C, 1,000 mg at 04/01/24 0941   melatonin tablet 3 mg, 3 mg, Oral, QHS, Garrick Charleston, MD, 3 mg at 03/31/24 2226   QUEtiapine  (SEROQUEL ) tablet 100 mg, 100 mg, Oral, QHS, Mardy Legacy, NP  Current Outpatient Medications:    cefadroxil  (DURICEF) 500 MG capsule, Take 2 capsules (1,000 mg total) by mouth 2 (two) times daily for 25 days., Disp: 100 capsule, Rfl: 0  Allergies: Allergies  Allergen Reactions   Codeine Other (See Comments)    Patient states parents told him he was allergic at a young age.   Fish Allergy Other (See Comments)    Patient does not wish to eat ANY fish- reaction unclear   Shellfish Allergy Other (See Comments)    Patient does not wish to eat ANY shellfish- reaction unclear    Legacy Mardy,  NP

## 2024-04-03 ENCOUNTER — Emergency Department (HOSPITAL_COMMUNITY): Payer: MEDICAID

## 2024-04-03 ENCOUNTER — Encounter (HOSPITAL_COMMUNITY): Payer: Self-pay | Admitting: *Deleted

## 2024-04-03 ENCOUNTER — Emergency Department (HOSPITAL_COMMUNITY)
Admission: EM | Admit: 2024-04-03 | Discharge: 2024-04-03 | Disposition: A | Discharge status: Home / Self Care | Payer: MEDICAID | Attending: Student | Admitting: Student

## 2024-04-03 ENCOUNTER — Other Ambulatory Visit: Payer: Self-pay

## 2024-04-03 DIAGNOSIS — M7061 Trochanteric bursitis, right hip: Secondary | ICD-10-CM | POA: Insufficient documentation

## 2024-04-03 DIAGNOSIS — I10 Essential (primary) hypertension: Secondary | ICD-10-CM | POA: Diagnosis not present

## 2024-04-03 DIAGNOSIS — Y9301 Activity, walking, marching and hiking: Secondary | ICD-10-CM | POA: Insufficient documentation

## 2024-04-03 DIAGNOSIS — Z96642 Presence of left artificial hip joint: Secondary | ICD-10-CM | POA: Insufficient documentation

## 2024-04-03 DIAGNOSIS — F17210 Nicotine dependence, cigarettes, uncomplicated: Secondary | ICD-10-CM | POA: Diagnosis not present

## 2024-04-03 DIAGNOSIS — M25551 Pain in right hip: Secondary | ICD-10-CM | POA: Diagnosis present

## 2024-04-03 LAB — COMPREHENSIVE METABOLIC PANEL WITH GFR
ALT: 25 U/L (ref 0–44)
AST: 52 U/L — ABNORMAL HIGH (ref 15–41)
Albumin: 3.8 g/dL (ref 3.5–5.0)
Alkaline Phosphatase: 114 U/L (ref 38–126)
Anion gap: 12 (ref 5–15)
BUN: 6 mg/dL (ref 6–20)
CO2: 24 mmol/L (ref 22–32)
Calcium: 8.9 mg/dL (ref 8.9–10.3)
Chloride: 98 mmol/L (ref 98–111)
Creatinine, Ser: 0.53 mg/dL — ABNORMAL LOW (ref 0.61–1.24)
GFR, Estimated: >60 mL/min (ref >60)
Glucose, Bld: 94 mg/dL (ref 70–99)
Potassium: 3.7 mmol/L (ref 3.5–5.1)
Sodium: 134 mmol/L — ABNORMAL LOW (ref 135–145)
Total Bilirubin: 0.6 mg/dL (ref 0.0–1.2)
Total Protein: 8.7 g/dL — ABNORMAL HIGH (ref 6.5–8.1)

## 2024-04-03 LAB — CBC WITH DIFFERENTIAL/PLATELET
Abs Immature Granulocytes: 0.02 10*3/uL (ref 0.00–0.07)
Basophils Absolute: 0.1 10*3/uL (ref 0.0–0.1)
Basophils Relative: 1 %
Eosinophils Absolute: 0 10*3/uL (ref 0.0–0.5)
Eosinophils Relative: 0 %
HCT: 34.8 % — ABNORMAL LOW (ref 39.0–52.0)
Hemoglobin: 11.1 g/dL — ABNORMAL LOW (ref 13.0–17.0)
Immature Granulocytes: 0 %
Lymphocytes Relative: 34 %
Lymphs Abs: 2.6 10*3/uL (ref 0.7–4.0)
MCH: 28 pg (ref 26.0–34.0)
MCHC: 31.9 g/dL (ref 30.0–36.0)
MCV: 87.7 fL (ref 80.0–100.0)
Monocytes Absolute: 0.9 10*3/uL (ref 0.1–1.0)
Monocytes Relative: 11 %
Neutro Abs: 4.1 10*3/uL (ref 1.7–7.7)
Neutrophils Relative %: 54 %
Platelets: 356 10*3/uL (ref 150–400)
RBC: 3.97 MIL/uL — ABNORMAL LOW (ref 4.22–5.81)
RDW: 13.4 % (ref 11.5–15.5)
WBC: 7.8 10*3/uL (ref 4.0–10.5)
nRBC: 0 % (ref 0.0–0.2)

## 2024-04-03 LAB — SEDIMENTATION RATE: Sed Rate: 30 mm/h — ABNORMAL HIGH (ref 0–16)

## 2024-04-03 LAB — C-REACTIVE PROTEIN: CRP: 1.3 mg/dL — ABNORMAL HIGH (ref <1.0)

## 2024-04-03 MED ORDER — ONDANSETRON 4 MG PO TBDP
4.0000 mg | ORAL_TABLET | Freq: Once | ORAL | Status: AC | PRN
  Administered 2024-04-03: 4 mg via ORAL
  Filled 2024-04-03: qty 1

## 2024-04-03 MED ORDER — GADOBUTROL 1 MMOL/ML IV SOLN
6.0000 mL | Freq: Once | INTRAVENOUS | Status: AC | PRN
  Administered 2024-04-03: 6 mL via INTRAVENOUS

## 2024-04-03 MED ORDER — KETOROLAC TROMETHAMINE 15 MG/ML IJ SOLN
15.0000 mg | Freq: Once | INTRAMUSCULAR | Status: AC
  Administered 2024-04-03: 15 mg via INTRAMUSCULAR
  Filled 2024-04-03: qty 1

## 2024-04-03 NOTE — ED Notes (Addendum)
 Pt vomiting after being triaged. Reports chronic abdominal pain with emesis x 11 since yesterday. Reports using heroin about 2-3 hours ago to help lessen pain

## 2024-04-03 NOTE — ED Provider Notes (Signed)
 [facility] EMERGENCY DEPARTMENT AT [facility] Provider Note  CSN: [id number] Arrival date & time: 04/03/24 0454  Chief Complaint(s) Hip Pain  HPI Mark Hammond is a 33 y.o. male with PMH polysubstance abuse, alcohol abuse, schizoaffective disorder, previous left-sided hip replacement who presents emergency room for evaluation of right hip pain.  States that he previous had cellulitis of his surgical wound and is concerned he might be developing an infection on the right side of his hip.  Has difficulty walking on the hip.  Denies fever, chest pain, shortness of breath, abdominal pain, nausea, vomiting or other systemic symptoms.   Past Medical History Past Medical History:  Diagnosis Date   Alcoholic gastritis    Bipolar 1 disorder (HCC)    Dental abscess    Depression    Drug abuse (HCC)    ETOH abuse    Hallucination    Hypertension    Schizo affective schizophrenia (HCC)    Substance abuse (HCC)    Patient Active Problem List   Diagnosis Date Noted   Cocaine  use disorder, severe, dependence (HCC) 03/31/2024   Osteomyelitis of left hip (HCC) 02/22/2024   Cellulitis 02/20/2024   Severe sepsis (HCC) 01/31/2024   Hypokalemia 01/31/2024   Chronic alcohol abuse 01/31/2024   Anemia of chronic disease 01/31/2024   Transaminitis 01/31/2024   Bipolar 1 disorder (HCC)    Amphetamine use disorder, 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   Polysubstance dependence (HCC) 12/13/2023   Polysubstance abuse (HCC) 11/21/2023   Alcohol use disorder 08/04/2023   Stimulant use disorder 08/04/2023   Heroin use disorder, severe (HCC) 08/04/2023   Tobacco use disorder 08/04/2023   Cannabis use disorder 08/04/2023   Hepatitis C 03/01/2023   Anxiety state 01/14/2023   Insomnia 01/14/2023   Schizoaffective disorder (HCC) 01/13/2023   Schizophrenia (HCC) 11/15/2019   Substance induced mood disorder (HCC) 10/24/2019   Methamphetamine use disorder, 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 Authorizing Provider  cefadroxil  (DURICEF) 500 MG capsule Take 2 capsules (1,000 mg total) by mouth 2 (two) times daily for 25 days. 03/28/24 04/22/24  Griselda Norris, MD                                                                                                                                    Past Surgical History Past Surgical History:  Procedure Laterality Date   TOTAL HIP ARTHROPLASTY Left 02/23/2024   Procedure: ARTHROPLASTY, HIP, TOTAL,POSTERIOR APPROACH;  Surgeon: Jerri Kay HERO, MD;  Location: [facility] OR;  Service: Orthopedics;  Laterality: Left;  LEFT POSTERIOR HIP RESECTION ARTHROPLASTY WITH ANTIBIOTIC SPACER   Family History Family History  Problem Relation Age of Onset   Stroke Other  Social History Social History   Tobacco Use   Smoking status: Every Day    Current packs/day: 1.00    Types: Cigarettes    Passive exposure: Current   Smokeless tobacco: Never  Vaping Use   Vaping status: Every Day   Substances: THC  Substance Use Topics   Alcohol use: Yes    Comment: 4-5x 40oz beers daily   Drug use: Yes    Frequency: 7.0 times per week    Types: Crack cocaine , Heroin, MDMA (Ecstacy), Marijuana, Cocaine     Comment: 03/27/2024   Allergies Codeine, Fish allergy, and Shellfish allergy  Review of Systems Review of Systems  Musculoskeletal:  Positive for arthralgias and myalgias.    Physical Exam Vital Signs  I have reviewed the triage vital signs BP 115/83 (BP Location: Left Arm)   Pulse (!) 108   Temp 98.7 F (37.1 C) (Oral)   Resp 16   Ht 5' 5 (1.651 m)   Wt 61.2 kg   SpO2 97%   BMI 22.47 kg/m   Physical Exam Constitutional:      General: He is not in acute distress.    Appearance: Normal  appearance.  HENT:     Head: Normocephalic and atraumatic.     Nose: No congestion or rhinorrhea.  Eyes:     General:        Right eye: No discharge.        Left eye: No discharge.     Extraocular Movements: Extraocular movements intact.     Pupils: Pupils are equal, round, and reactive to light.  Cardiovascular:     Rate and Rhythm: Normal rate and regular rhythm.     Heart sounds: No murmur heard. Pulmonary:     Effort: No respiratory distress.     Breath sounds: No wheezing or rales.  Abdominal:     General: There is no distension.     Tenderness: There is no abdominal tenderness.  Musculoskeletal:        General: Tenderness present. Normal range of motion.     Cervical back: Normal range of motion.  Skin:    General: Skin is warm and dry.  Neurological:     General: No focal deficit present.     Mental Status: He is alert.     ED Results and Treatments Labs (all labs ordered are listed, but only abnormal results are displayed) Labs Reviewed  COMPREHENSIVE METABOLIC PANEL WITH GFR  CBC WITH DIFFERENTIAL/PLATELET  SEDIMENTATION RATE  C-REACTIVE PROTEIN                                                                                                                          Radiology No results found.  Pertinent labs & imaging results that were available during my care of the patient were reviewed by me and considered in my medical decision making (see MDM for details).  Medications Ordered in ED Medications  ketorolac  (TORADOL ) 15 MG/ML injection 15 mg (has  no administration in time range)  ondansetron  (ZOFRAN -ODT) disintegrating tablet 4 mg (4 mg Oral Given 04/03/24 9487)                                                                                                                                     Procedures Procedures  (including critical care time)  Medical Decision Making / ED Course   This patient presents to the ED for concern of hip pain, this  involves an extensive number of treatment options, and is a complaint that carries with it a high risk of complications and morbidity.  The differential diagnosis includes fracture, hematoma, contusion, septic joint  MDM: Patient seen emergency room for evaluation of hip pain.  Physical exam with tenderness to palpation of the hip but no overlying erythema, warmth or wound to the hip.  X-ray imaging with no acute pathology.  CBC without significant leukocytosis, hemoglobin 11.1.  Pending completion of laboratory evaluation at time of signout.  Please see provider signout for continuation of workup.   Additional history obtained: -External records from outside source obtained and reviewed including: Chart review including previous notes, labs, imaging, consultation notes   Lab Tests: -I ordered, reviewed, and interpreted labs.   The pertinent results include:   Labs Reviewed  COMPREHENSIVE METABOLIC PANEL WITH GFR  CBC WITH DIFFERENTIAL/PLATELET  SEDIMENTATION RATE  C-REACTIVE PROTEIN     Imaging Studies ordered: I ordered imaging studies including hip x-ray I independently visualized and interpreted imaging. I agree with the radiologist interpretation   Medicines ordered and prescription drug management: Meds ordered this encounter  Medications   ondansetron  (ZOFRAN -ODT) disintegrating tablet 4 mg   ketorolac  (TORADOL ) 15 MG/ML injection 15 mg    -I have reviewed the patients home medicines and have made adjustments as needed  Critical interventions none    Cardiac Monitoring: The patient was maintained on a cardiac monitor.  I personally viewed and interpreted the cardiac monitored which showed an underlying rhythm of: NSR  Social Determinants of Health:  Factors impacting patients care include: none   Reevaluation: After the interventions noted above, I reevaluated the patient and found that they have :improved  Co morbidities that complicate the patient  evaluation  Past Medical History:  Diagnosis Date   Alcoholic gastritis    Bipolar 1 disorder (HCC)    Dental abscess    Depression    Drug abuse (HCC)    ETOH abuse    Hallucination    Hypertension    Schizo affective schizophrenia (HCC)    Substance abuse (HCC)       Dispostion: I considered admission for this patient, and disposition pending completion of laboratory evaluation.  Please see provider signout for continuation of workup.     Final Clinical Impression(s) / ED Diagnoses Final diagnoses:  None     @PCDICTATION @    Albertina Dixon, MD 04/03/24 [phone]

## 2024-04-03 NOTE — Discharge Instructions (Signed)
 Follow-up with your orthopedist.  If you develop fever, new or worsening or uncontrolled pain, or any other new/concerning symptoms then return to the ER.

## 2024-04-03 NOTE — ED Provider Notes (Signed)
 9:40 AM WBC normal. ESR mildly elevated. CRP is pending, but the nurse reports it has to go to [facility] and will take multiple hours. Patient has some mild limited ROM of hip. He tells me this is similar to when he had the left hip problems and he does have a history of substance abuse.  Will go ahead and order the MRI of the hip to rule out an obvious osteo or see if there are other signs of septic joint.  I have reviewed the results and presentation with Ortho, Dr. Genelle.  The superior migration of the hip is not concerning and likely more chronic and pretty typical for him.  There is always going to be some inflammation in with low inflammatory numbers does not feel like we need to do an arthrocentesis on the left.  The right hip pain seems to be more from right greater trochanter bursitis.  No erythema on exam and with low inflammatory numbers I doubt this is infectious.  Otherwise, from an Ortho perspective he is stable for discharge.  Will have him follow-up with outpatient Ortho and ID as scheduled.   Freddi Hamilton, MD 04/03/24 1329

## 2024-04-03 NOTE — ED Triage Notes (Signed)
 Pt arrived with ems for right hip pain. Reports recent surgery to left hip for cellulitis. Pain is similar to when he was dx with cellulitis in right hip. Requesting MRI for reassurance that I don't have cellulitis again. Ambulates with a walker

## 2024-04-08 ENCOUNTER — Emergency Department (HOSPITAL_COMMUNITY): Payer: MEDICAID

## 2024-04-08 ENCOUNTER — Emergency Department (HOSPITAL_COMMUNITY)
Admission: EM | Admit: 2024-04-08 | Discharge: 2024-04-08 | Disposition: A | Discharge status: Home / Self Care | Payer: MEDICAID | Attending: Emergency Medicine | Admitting: Emergency Medicine

## 2024-04-08 ENCOUNTER — Emergency Department (EMERGENCY_DEPARTMENT_HOSPITAL)
Admission: EM | Admit: 2024-04-08 | Discharge: 2024-04-08 | Disposition: A | Discharge status: Home / Self Care | Payer: MEDICAID | Source: Home / Self Care | Attending: Emergency Medicine | Admitting: Emergency Medicine

## 2024-04-08 ENCOUNTER — Encounter (HOSPITAL_COMMUNITY): Payer: Self-pay

## 2024-04-08 ENCOUNTER — Encounter (HOSPITAL_COMMUNITY): Payer: Self-pay | Admitting: Emergency Medicine

## 2024-04-08 ENCOUNTER — Other Ambulatory Visit: Payer: Self-pay

## 2024-04-08 DIAGNOSIS — Z5900 Homelessness unspecified: Secondary | ICD-10-CM | POA: Insufficient documentation

## 2024-04-08 DIAGNOSIS — R7401 Elevation of levels of liver transaminase levels: Secondary | ICD-10-CM | POA: Insufficient documentation

## 2024-04-08 DIAGNOSIS — R45850 Homicidal ideations: Secondary | ICD-10-CM | POA: Insufficient documentation

## 2024-04-08 DIAGNOSIS — F1910 Other psychoactive substance abuse, uncomplicated: Secondary | ICD-10-CM | POA: Insufficient documentation

## 2024-04-08 DIAGNOSIS — E876 Hypokalemia: Secondary | ICD-10-CM | POA: Insufficient documentation

## 2024-04-08 DIAGNOSIS — M79671 Pain in right foot: Secondary | ICD-10-CM | POA: Insufficient documentation

## 2024-04-08 DIAGNOSIS — I10 Essential (primary) hypertension: Secondary | ICD-10-CM | POA: Insufficient documentation

## 2024-04-08 LAB — RAPID URINE DRUG SCREEN, HOSP PERFORMED
Amphetamines: POSITIVE — AB
Barbiturates: NOT DETECTED
Benzodiazepines: POSITIVE — AB
Cocaine: POSITIVE — AB
Opiates: NOT DETECTED
Tetrahydrocannabinol: NOT DETECTED

## 2024-04-08 LAB — COMPREHENSIVE METABOLIC PANEL WITH GFR
ALT: 30 U/L (ref 0–44)
AST: 71 U/L — ABNORMAL HIGH (ref 15–41)
Albumin: 3.6 g/dL (ref 3.5–5.0)
Alkaline Phosphatase: 100 U/L (ref 38–126)
Anion gap: 17 — ABNORMAL HIGH (ref 5–15)
BUN: 5 mg/dL — ABNORMAL LOW (ref 6–20)
CO2: 22 mmol/L (ref 22–32)
Calcium: 8.6 mg/dL — ABNORMAL LOW (ref 8.9–10.3)
Chloride: 97 mmol/L — ABNORMAL LOW (ref 98–111)
Creatinine, Ser: 0.61 mg/dL (ref 0.61–1.24)
GFR, Estimated: >60 mL/min (ref >60)
Glucose, Bld: 96 mg/dL (ref 70–99)
Potassium: 3.3 mmol/L — ABNORMAL LOW (ref 3.5–5.1)
Sodium: 136 mmol/L (ref 135–145)
Total Bilirubin: 0.5 mg/dL (ref 0.0–1.2)
Total Protein: 7.9 g/dL (ref 6.5–8.1)

## 2024-04-08 LAB — CBC
HCT: 35 % — ABNORMAL LOW (ref 39.0–52.0)
Hemoglobin: 11.6 g/dL — ABNORMAL LOW (ref 13.0–17.0)
MCH: 28 pg (ref 26.0–34.0)
MCHC: 33.1 g/dL (ref 30.0–36.0)
MCV: 84.5 fL (ref 80.0–100.0)
Platelets: 407 K/uL — ABNORMAL HIGH (ref 150–400)
RBC: 4.14 MIL/uL — ABNORMAL LOW (ref 4.22–5.81)
RDW: 13.2 % (ref 11.5–15.5)
WBC: 6.8 K/uL (ref 4.0–10.5)
nRBC: 0 % (ref 0.0–0.2)

## 2024-04-08 LAB — ACETAMINOPHEN LEVEL: Acetaminophen (Tylenol), Serum: <10 ug/mL — ABNORMAL LOW (ref 10–30)

## 2024-04-08 LAB — SALICYLATE LEVEL: Salicylate Lvl: <7 mg/dL — ABNORMAL LOW (ref 7.0–30.0)

## 2024-04-08 LAB — ETHANOL: Alcohol, Ethyl (B): 100 mg/dL — ABNORMAL HIGH (ref <15)

## 2024-04-08 MED ORDER — ADULT MULTIVITAMIN W/MINERALS CH
1.0000 | ORAL_TABLET | Freq: Every day | ORAL | Status: DC
  Administered 2024-04-08: 1 via ORAL
  Filled 2024-04-08: qty 1

## 2024-04-08 MED ORDER — FOLIC ACID 1 MG PO TABS
1.0000 mg | ORAL_TABLET | Freq: Every day | ORAL | Status: DC
  Administered 2024-04-08: 1 mg via ORAL
  Filled 2024-04-08: qty 1

## 2024-04-08 MED ORDER — ACETAMINOPHEN 325 MG PO TABS
650.0000 mg | ORAL_TABLET | Freq: Once | ORAL | Status: AC
  Administered 2024-04-08: 650 mg via ORAL
  Filled 2024-04-08: qty 2

## 2024-04-08 MED ORDER — POTASSIUM CHLORIDE CRYS ER 20 MEQ PO TBCR
40.0000 meq | EXTENDED_RELEASE_TABLET | Freq: Once | ORAL | Status: AC
  Administered 2024-04-08: 40 meq via ORAL
  Filled 2024-04-08: qty 2

## 2024-04-08 MED ORDER — THIAMINE MONONITRATE 100 MG PO TABS
100.0000 mg | ORAL_TABLET | Freq: Every day | ORAL | Status: DC
  Administered 2024-04-08: 100 mg via ORAL
  Filled 2024-04-08: qty 1

## 2024-04-08 MED ORDER — IBUPROFEN 400 MG PO TABS: 400.0000 mg | ORAL_TABLET | Freq: Four times a day (QID) | ORAL | Status: DC | PRN

## 2024-04-08 MED ORDER — THIAMINE HCL 100 MG/ML IJ SOLN: 100.0000 mg | Freq: Every day | INTRAMUSCULAR | Status: DC

## 2024-04-08 NOTE — ED Notes (Signed)
 EMT Honor got patients belongings and taken to patient and changed

## 2024-04-08 NOTE — ED Triage Notes (Signed)
 Pt presents to the ED via [facility] with complaints of R foot pain that started tonight. Pt ambulates with walker. A&Ox4 at this time. Denies CP or SOB.   VS W/ EMS 150/98 18 R 99% RA 100 HR

## 2024-04-08 NOTE — ED Provider Triage Note (Signed)
 Emergency Medicine Provider Triage Evaluation Note  Mark Hammond , a 33 y.o. male  was evaluated in triage.  Pt complains of passive suicidal + homicidal thoughts.  Denies any specific plans. Endorses right foot pain and left hip pain status post surgery to left hip but states that this is not what prompted him to come to the emergency room.  When asked about cocaine , amphetamines, other recreational drug he states all of the above. No fever or chills at home.   Review of Systems  Positive: SI/HI Negative: Fever   Physical Exam  BP 133/88 (BP Location: Right Arm)   Pulse (!) 102   Temp 98.2 F (36.8 C)   Resp 16   SpO2 94%  Gen:   Awake, no distress   Resp:  Normal effort  MSK:   Moves extremities without difficulty  Other:  DP/PT pulses present in BL feet  Medical Decision Making  Medically screening exam initiated at 7:41 AM.  Appropriate orders placed.  Mark Hammond was informed that the remainder of the evaluation will be completed by another provider, this initial triage assessment does not replace that evaluation, and the importance of remaining in the ED until their evaluation is complete.  Pt medically evaluated and screened   Mark Hammond, Mark Hammond 04/08/24 [phone]

## 2024-04-08 NOTE — ED Provider Notes (Cosign Needed Addendum)
 [place] EMERGENCY DEPARTMENT AT [facility] Provider Note   CSN: [id number] Arrival date & time: 04/08/24  9294     Patient presents with: SI/HI and Foot Pain   Mark Hammond is a 33 y.o. male with PMHx bipolar 1 disorder, polysubstance use disorder, schizoaffective schizophrenia, HTN who presents to ED concern for HI.  Patient endorses feeling this way for 2-3 days.  Patient without specific plan. Patient stating that he feels like he just wants to hurt vague people who have made him upset.   Of note, patient also concerned for his chronic hip and foot pain. Patient only takes pain medication when he is given it in ED. Patient stating that no new traumas happened since recent imaging in ED.    Foot Pain       Prior to Admission medications   Medication Sig Start Date End Date Taking? Authorizing Provider  cefadroxil  (DURICEF) 500 MG capsule Take 2 capsules (1,000 mg total) by mouth 2 (two) times daily for 25 days. 03/28/24 04/22/24  Griselda Norris, MD    Allergies: Codeine, Fish allergy, and Shellfish allergy    Review of Systems  Psychiatric/Behavioral:         Homicidal ideas    Updated Vital Signs BP 116/69   Pulse 98   Temp 98.6 F (37 C)   Resp 18   Ht 5' 5 (1.651 m)   Wt 61.2 kg   SpO2 100%   BMI 22.45 kg/m   Physical Exam Vitals and nursing note reviewed.  Constitutional:      General: He is not in acute distress.    Appearance: He is not ill-appearing or toxic-appearing.  HENT:     Head: Normocephalic and atraumatic.     Mouth/Throat:     Mouth: Mucous membranes are moist.  Eyes:     General: No scleral icterus.       Right eye: No discharge.        Left eye: No discharge.     Conjunctiva/sclera: Conjunctivae normal.  Cardiovascular:     Rate and Rhythm: Normal rate and regular rhythm.     Pulses: Normal pulses.     Heart sounds: Normal heart sounds. No murmur heard. Pulmonary:     Effort: Pulmonary effort is normal. No respiratory  distress.     Breath sounds: Normal breath sounds. No wheezing, rhonchi or rales.  Abdominal:     General: Abdomen is flat.  Musculoskeletal:     Right lower leg: No edema.     Left lower leg: No edema.     Comments: +2 pedal pulses BL. BL feet without obvious swelling, erythema, or increased warmth.  Skin:    General: Skin is warm and dry.     Findings: No rash.  Neurological:     General: No focal deficit present.     Mental Status: He is alert and oriented to person, place, and time. Mental status is at baseline.     Comments: GCS 15. Speech is goal oriented. Patient moves extremities without ataxia. Clinically sober.   Psychiatric:        Mood and Affect: Mood normal.     (all labs ordered are listed, but only abnormal results are displayed) Labs Reviewed  COMPREHENSIVE METABOLIC PANEL WITH GFR - Abnormal; Notable for the following components:      Result Value   Potassium 3.3 (*)    Chloride 97 (*)    BUN 5 (*)    Calcium 8.6 (*)  AST 71 (*)    Anion gap 17 (*)    All other components within normal limits  ETHANOL - Abnormal; Notable for the following components:   Alcohol, Ethyl (B) 100 (*)    All other components within normal limits  CBC - Abnormal; Notable for the following components:   RBC 4.14 (*)    Hemoglobin 11.6 (*)    HCT 35.0 (*)    Platelets 407 (*)    All other components within normal limits  RAPID URINE DRUG SCREEN, HOSP PERFORMED - Abnormal; Notable for the following components:   Cocaine  POSITIVE (*)    Benzodiazepines POSITIVE (*)    Amphetamines POSITIVE (*)    All other components within normal limits  ACETAMINOPHEN  LEVEL - Abnormal; Notable for the following components:   Acetaminophen  (Tylenol ), Serum <10 (*)    All other components within normal limits  SALICYLATE LEVEL - Abnormal; Notable for the following components:   Salicylate Lvl <7.0 (*)    All other components within normal limits    EKG: None  Radiology: DG Foot  Complete Right Result Date: 04/08/2024 EXAM: 3 or more VIEW(S) XRAY OF THE RIGHT FOOT 04/08/2024 05:26:15 AM COMPARISON: None available. CLINICAL HISTORY: Pain. Via [facility] with complaints of right foot pain that started tonight. FINDINGS: BONES AND JOINTS: No acute fracture. No focal osseous lesion. No joint dislocation. SOFT TISSUES: The soft tissues are unremarkable. IMPRESSION: 1. No significant abnormality. Electronically signed by: Lonni Necessary MD 04/08/2024 06:14 AM EDT RP Workstation: [id number]     Procedures   Medications Ordered in the ED  thiamine  (VITAMIN B1) tablet 100 mg (100 mg Oral Given 04/08/24 1026)    Or  thiamine  (VITAMIN B1) injection 100 mg ( Intravenous See Alternative 04/08/24 1026)  folic acid  (FOLVITE ) tablet 1 mg (1 mg Oral Given 04/08/24 1025)  multivitamin with minerals tablet 1 tablet (1 tablet Oral Given 04/08/24 1026)  potassium chloride  SA (KLOR-CON  M) CR tablet 40 mEq (40 mEq Oral Given 04/08/24 1026)  acetaminophen  (TYLENOL ) tablet 650 mg (650 mg Oral Given 04/08/24 1027)                                    Medical Decision Making Amount and/or Complexity of Data Reviewed Labs: ordered.  Risk OTC drugs. Prescription drug management.   This patient presents to the ED for psych evaluation, this involves an extensive number of treatment options, and is a complaint that carries with it a high risk of complications and morbidity.  The differential diagnosis includes primary psychosis, substance-induced psychosis, mood disturbance, SI/HI.   Co morbidities that complicate the patient evaluation  bipolar 1 disorder, polysubstance use disorder, schizoaffective schizophrenia, HTN   Additional history obtained:  No PCP listed in chart   Lab Tests:  I Ordered, and personally interpreted labs.  The pertinent results include:   -UDS: Positive for cocaine , benzos, amphetamines - patient is clinically sober and tolerating PO fluids well in ED. -  Acetaminophen /salicylate: Within normal limits. - EtOH: 100 - CMP with mild hypokalemia 3.3.  AST mildly elevated at 71. Mild anion gap at 17 - appears to be d/t dehydration and recent ETOH/polysubstance use. - CBC without leukocytosis.  There is mild anemia with hemoglobin 11.6.    Problem List / ED Course / Critical interventions / Medication management  Patient presented for psychiatric evaluation. Patient is endorsing HI. On my initial exam, the pt was  linear in thought, appropriate in affect, and overall well-appearing. Vital signs reviewed and reassuring. With the patient's presentation of HI, patient warrants emergent psychiatric consultation.  Patient does not appear to be in EtOH withdrawals at this time. There was mild tachycardia at 102 BPM which resolved after PO hydration.  Patient immediately placed into ED psychiatric hold protocol including suicide precautions, elopement precautions and vital sign monitoring. TTS consulted for further evaluation once patient medically cleared. Medical screening evaluation ordered and reviewed with no obvious medical reason to postpone psychiatric evaluation. Patient is voluntary at this time. No IVC. May need to be reassessed if capacity is changing.  I ordered patient oral potassium supplementation, thiamine , folic acid , multivitamin.  Also ordered patient Tylenol  for his chronic pains. I have reviewed the patients home medicines and have made adjustments as needed. 2:42PM: TTS was able to see patient. Patient now declining HI. Psych has cleared patient. Patient normally ambulates with walker - unsure where that is currently. I will order new walker and have patient follow up with orthopedics.    Social Determinants of Health:  homelessness       Final diagnoses:  Homicidal ideation    ED Discharge Orders     None             Hoy Nidia FALCON, [place] 04/08/24 1443    Levander Houston, MD 04/11/24 [phone]

## 2024-04-08 NOTE — Discharge Instructions (Addendum)
 The x-ray of your foot looked good.  Please follow-up with your regular doctor.  If symptoms change or worsen, return to the ER.

## 2024-04-08 NOTE — ED Notes (Signed)
 Patient came back already in paper scrubs

## 2024-04-08 NOTE — ED Triage Notes (Signed)
 Pt came in via POV d/t Rt foot pain since last night, was seen for it last night. Pt also reports feeling HI towards certain people & that he was suicidal last night. Denies any specific SI plan just thoughts of SI & last used heroine, crack & ETOH last night & feels like he is withdrawing. A/Ox4, rates his foot pain 6/10 during triage.

## 2024-04-08 NOTE — Discharge Instructions (Addendum)
 You will need to follow up with orthopedics for you chronic feet and hip pain.  Based on what you have shared, a list of resources for outpatient therapy and psychiatry is provided below to get you started back on treatment.  It is imperative that you follow through with treatment within 5-7 days from the day of discharge to prevent any further risk to your safety or mental well-being.  You are not limited to the list provided.  In case of an urgent crisis, you may contact the Mobile Crisis Unit with [facility] at [phone].        Outpatient Services for Therapy and Medication Management for [facility] [address], [place], [address] [phone] phone  New Patient Assessment/Therapy Walk-ins Monday and Wednesday: 8am until slots are full. Every 1st and 2nd Friday: 1pm - 5pm  NO ASSESSMENT/THERAPY WALK-INS ON TUESDAYS OR THURSDAYS  New Patient Psychiatry/Medication Management Walk-ins Monday-Friday: 8am-11am  For all walk-ins, we ask that you arrive by 7:30am because patient will be seen in the order of arrival.  Availability is limited; therefore, you may not be seen on the same day that you walk-in.  Our goal is to serve and meet the needs of our community to the best of our ability.   [facility] [address] [place], [place], [address] [phone] phone  [facility] [address] [place], [place], [address] [phone] phone [phone] phone ([address], Anthem/Elevance, [address], Centivo, [address], [address], Healthy [place], [place], [place], [address], ConocoPhillips, [place], UHC, American Financial, [place], Out of Network)  [employer], [place] [address] [place], [place], [address] [phone] phone (Celoron, Anthem/Elevance, Sanmina-SCI Options/Carelon, BCBS, [address], [place], [place],  [place], [place], Harrah's Entertainment, [place], [place], [place], [facility])  [employer] [address] [place], [place], [address] [phone] phone (Medicaid, ask about other insurance)  [facility] [address], [address] [place], [place], [address] [phone] phone [phone] fax ([address] [place] , [place], [place], Keystone Health Choice, UHC, General Electric, Self-Pay)  Sarah Lempka [address] [place], [place], [address] [phone] phone ([address], Anthem/Elevance, [address], [address], [place], CSX Corporation, [place], [place], [place], Harrah's Entertainment, [place], Athena, [place], [facility])  [employer] [facility] - 6-8 MONTH WAIT FOR THERAPY; SOONER FOR MEDICATION MANAGEMENT [address], [address] [place], [place], [address] [phone] phone ([address], AmeriHealth [address] - [place], [address], [place], [place], Friday Health Plans, [address], BCBS Healthy [place], [place], [address], [place], [place], [place], [place], Tricare, UHC, Safeco Corporation, [place])  [facility] [address], [address] [place], [place], [address] [phone] phone  [facility] [address] [place], [place], [address] [phone] phone  [facility] [address] [place], [place], [address] [phone] phone  [facility] - THERAPY ONLY [address]  [place], [place], [address] [phone] phone  [facility], [place] [address], [place], [address] [phone] phone  [facility] [address] [place], [place], [address] [phone] phone [phone] fax  [facility] [address] [place], [place], [address] [phone] phone [phone] fax  [employer] [phone] [address] [place], [place], [address] [phone] phone  Janit Griffins [address] [place], [place], [address]  [phone]  phone  [facility]  (Adults Only) [address] [place],  [place], [address]  [phone] phone [phone] fax   Substance Abuse Treatment Programs  Intensive Outpatient Programs [facility]     [address]      [place], [place]                   [phone]       [facility] [address] [address] [place], [place] [phone]  [facility]     (Inpatient and outpatient)     [address]           [phone]    [facility] [phone] (Suboxone and Methadone)  [address]      [place], [place] [address]      [phone]       [address] [place], [place] [phone]  [facility] (Outpatient/Inpatient, Chemical)    (insurance only) [phone]             [facility] (Groups & Residential) [place], [place] [phone]     [facility]     [address]     [place], [place]      [phone]       [facility] (for caregivers and family) [phone] [place], [place] [phone]      Residential Treatment Programs [facility]      [address], [place], [place] [address]  [phone]       [facility] [address], [place], [place] [address] [phone]  [facility] [place]        [phone]       [facility] [phone]  [facility] (Addiction [facility]             [address]                                         [place], [place]                                                [phone] or [phone]                               [facility] [place] [address] [place], [address] [phone]  [facility]    [address]      [place], [place]     [phone]       [facility] [address] [place], [place] [phone]  [facility]   [address] [place], [place]  [address]     [phone]      Admissions: 8am-3pm M-F  [facility] (RTS) [address] [place], [place] [phone]  [facility] Program: Residential Program [phone] Days)   [place], [place]      [phone] or [phone]     ADATC: [place]  [facility] [place], [place] (Walk in Hours over the weekend or by referral)  [facility] [address] [place], [place], [place] [address] [phone]  Crisis Mobile: [facility]:  [phone] (for crisis response 24 hours a day) [facility] Hotline:      [phone]  Outpatient Psychiatry and Counseling  [facility]: Mobile Crisis Management 24 hours:  [phone]  [facility] of the [employer] sliding scale fee and walk in schedule: M-F 8am-12pm/1pm-3pm [address]  [place], [place] [address] [phone]  [facility] [address] [place], [place] [address] [phone]  [facility] (Formerly known as [facility] [employer])- new patient walk-in appointments available Monday - Friday 8am -3pm.          [address] [place], [place] [address] [phone] or crisis line- [phone]  [facility] Outpatient Services/ Intensive Outpatient Therapy Program [address] [place], [place] [address] [phone]  [facility] Mental Health                  Crisis Services      [phone] [address]     [place], [place] [address]                 [facility]   [facility] [phone]. [address] [place], [place] [address]   [employer] of Care          [address] [place]  [place], [place] [address]       [phone]  [facility] [address] [place], [place] [address] [phone]  [facility]    [address]    [place], [place] [address]     [phone]       Ima Anon,  MD     [address]     [place] [address]     [phone]       [facility] [address] [place] [address]  [facility]     [address]     [place], [place]      [phone]       [facility] [place], MD [address] [place], [place] [address] [phone]  [facility]     [address]     [place], [place] [address]     [phone]       [facility] [address] [place], [place] [address] [phone] Resources for Temporary Residential Assistance/Crisis Centers  DAY CENTERS [facility]) M-F 8am-3pm   [address]  [place], [place] [address]   [phone] Services include: laundry, barbering, support groups, case management, phone  & computer access, showers, AA/NA mtgs, mental health/substance abuse nurse, job skills class, disability information, VA assistance, spiritual classes, etc.   HOMELESS SHELTERS  [facility]     [facility]   [address], [place]     [phone]              [employer] (women and children)       [address] [place], [place] [address] [phone] [email or web address] for application and process Application Required  [facility]   [address]    [place] [address]     [phone]                    [facility] [address] [place], [place] [address] [phone] application appt.) Application Required  [place] (women only)    [address]     [place], [place] [address]  [phone]      Intake starts 6pm daily Need valid ID, SSC, & Police report [employer] [address] [place], [place] [phone] Application Required  [employer] (men only)     [address]      [place],  [place]     [phone]       [facility] of the [place] (Pregnant women only) [address] [place], [place] [phone]  [facility]      [address]      [place], [place] [address]     [phone]             [facility] [address] [place], [place] [phone] 90 day commitment/SA/Application process  [facility](men only)     [address]     [place], [place]     [phone]       Check-in at [facility] [address] [place], [place] [address] [phone] Men/Women/Women and Children must be there by 7 pm  [facility] [place], [place] [phone]

## 2024-04-08 NOTE — ED Provider Notes (Signed)
 [facility] Emergency Department Provider Note MRN:  [id number]  Arrival date & time: 04/08/24     Chief Complaint   Foot Pain (R)   History of Present Illness   Mark Hammond is a 33 y.o. year-old male presents to the ED with chief complaint of right foot pain.  States that the symptoms started tonight.  He reports drinking ETOH tonight.  He denies any known trauma.  Normally walks with a walker.  Had recent hip surgery.  Denies any other associated symptoms.  History of polysubstance abuse.  History provided by patient.   Review of Systems  Pertinent positive and negative review of systems noted in HPI.    Physical Exam   Vitals:   04/08/24 0445  BP: (!) 134/90  Pulse: 100  Resp: 18  Temp: 98 F (36.7 C)  SpO2: 100%    CONSTITUTIONAL: Nontoxic appearing, NAD, smells of alcohol NEURO:  Alert and oriented x 3, CN 3-12 grossly intact EYES:  eyes equal and reactive ENT/NECK:  Supple, no stridor  CARDIO:  normal rate, regular rhythm, appears well-perfused, intact distal pulses.  PULM:  No respiratory distress,  GI/GU:  non-distended,  MSK/SPINE:  No gross deformities, no edema, moves all extremities, no obvious abnormality of the right foot  SKIN:  no rash, atraumatic   *Additional and/or pertinent findings included in MDM below  Diagnostic and Interventional Summary    EKG Interpretation Date/Time:    Ventricular Rate:    PR Interval:    QRS Duration:    QT Interval:    QTC Calculation:   R Axis:      Text Interpretation:         Labs Reviewed - No data to display  DG Foot Complete Right  Final Result      Medications - No data to display   Procedures  /  Critical Care Procedures  ED Course and Medical Decision Making  I have reviewed the triage vital signs, the nursing notes, and pertinent available records from the EMR.  Social Determinants Affecting Complexity of Care: Patient has no clinically significant social  determinants affecting this chief complaint..   ED Course:    Medical Decision Making Patient here with right foot pain.  Denies trauma.  Will check x-ray of his right foot.  Plain films of the right foot are negative.  He has intact distal pulses.  I do not see any redness or swelling of the foot.  I do not think that he requires any further observation or admission.  Amount and/or Complexity of Data Reviewed Radiology: ordered.         Consultants: No consultations were needed in caring for this patient.   Treatment and Plan: I considered admission due to patient's initial presentation, but after considering the examination and diagnostic results, patient will not require admission and can be discharged with outpatient follow-up.    Final Clinical Impressions(s) / ED Diagnoses     ICD-10-CM   1. Right foot pain  M79.671       ED Discharge Orders     None         Discharge Instructions Discussed with and Provided to Patient:     Discharge Instructions      The x-ray of your foot looked good.  Please follow-up with your regular doctor.  If symptoms change or worsen, return to the ER.       Vicky Charleston, PA-C 04/08/24 0630    Palumbo, April,  MD 04/08/24 [phone]

## 2024-04-08 NOTE — Consult Note (Signed)
 [facility] Psychiatric Consult Initial  Patient Name: .Kalum Minner  MRN: [id number]  DOB: 1991-04-13  Consult Order details:  Orders (From admission, onward)     Start     Ordered   04/08/24 0931  CONSULT TO CALL ACT TEAM       Ordering Provider: Hoy Nidia FALCON, PA-C  Provider:  (Not yet assigned)  Question:  Reason for Consult?  Answer:  HI   04/08/24 0930             Mode of Visit: In person    Psychiatry Consult Evaluation  Service Date: April 08, 2024 LOS:  LOS: 0 days  Chief Complaint my legs are not working and I dont have my walker  Primary Psychiatric Diagnoses  Polysubstance abuse F19 .10 Assessment  Diante Brosky is a 33 y.o. male admitted: Presented to the EDfor 04/08/2024  7:17 AM for with complaints of feeling homicidal and chronic foot pain. 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   Patient currently presents with medical complaints of not being able to use his legs as they are weekend and losing his walker.  He is denying any SI/HI/AVH.  He is endorsing substance use but is not interested in any type of treatment.  He endorses some depression and anxiety when he is not using substances.  His current presentation is most consistent with polysubstance use disorder.  He is recommended for outpatient psychiatric resources for medication management, therapy, and substance abuse treatment. He is declining [facility] admission.  Current outpatient psychotropic medications include Seroquel  100 mg nightly and historically he reports no response to this medications.  He reports noncompliance with medications  On initial examination, patient observed sitting in his hospital bed.  He is  disheveled.  He is alert/oriented x 4, cooperative, and fairly attentive.  He has normal speech and behavior.  BAL on admission 100.  However patient does not appear impaired.  States yesterday and last night he used a lot of drugs.  He remembers using cocaine , methamphetamines, marijuana, and alcohol. He is unsure of the amounts. He remembers getting mad last night and having vague thoughts to hurt someone.  He denies having any specific person or plan in mind.  He does not even remember why he was having these thoughts and blames the thoughts on his drug use.  He is denying any type of withdrawal symptom.  He is endorsing some depression and anxiety but states it is related to his substance use.  When he is using he feels great and when he is not using his when he typically begins to feel depressed or anxious.  He had also been living with his girlfriend but because he would not stop using drugs she kicked him out.  He is currently homeless.  He is denying any suicidal or homicidal ideations.  He denies access to firearms/weapons.  He is able to verbally contract for safety.  He is denying any psychotic symptoms such as paranoia, delusional thought, auditory visual hallucinations.  He does not appear to be responding to internal/external stimuli.  Discussed substance abuse treatment with patient and he is not interested.  He does not want to quit using drugs.  He is not interested in admission to the [facility]  or residential treatment.  He is voicing concerns for his physical health.  States his legs are weekend and does not feel as though he can use them.  He also states that he is lost his walker and without his walker he cannot walk.  However patient did state that earlier he had walked to [employer].  Patient well-known to the [facility] service line.  He has presented multiple times over the past few months and there are documented concerns for malingering and secondary gain.  Patient has been given follow-up  instructions and has not followed up with any resources that have been provided.  Patient is most concerned that his walker that has been lost.  He is not interested in any type of psychiatric treatment at this time.  Discussed outpatient psychiatric resources including [facility].   Please see plan below for detailed recommendations.    Diagnoses:  Active Hospital problems: Principal Problem:   Polysubstance abuse (HCC)    Plan   ## Psychiatric Medication Recommendations:  Continue Seroquel  100 mg nightly-Home medication  ## Medical Decision Making Capacity: Not specifically addressed in this encounter  ## Further Work-up:  -- No further workup recommended at this time  --No EKG this admission.  Most recent EKG on 03/30/2024 had QtC of 461 -- Pertinent labwork reviewed earlier this admission includes: CMP-potassium 3.3, chloride 97, calcium 8.6, AST 71.  CBC RBC 4.14, hemoglobin 11.6, hematocrit 35, platelets 407.  UDS positive for amphetamines, benzodiazepines, cocaine .  BAL 100.   ## Disposition:-- There are no psychiatric contraindications to discharge at this time  ## Behavioral / Environmental: - No specific recommendations at this time.     ## Safety and Observation Level:  - Based on my clinical evaluation, I estimate the patient to be at low risk of self harm in the current setting. - At this time, we recommend  routine. This decision is based on my review of the chart including patient's history and current presentation, interview of the patient, mental status examination, and consideration of suicide risk including evaluating suicidal ideation, plan, intent, suicidal or self-harm behaviors, risk factors, and protective factors. This judgment is based on our ability to directly address suicide risk, implement suicide prevention strategies, and develop a safety plan while the patient is in the clinical setting. Please contact our team if there is a concern that risk level has  changed.  CSSR Risk Category:C-SSRS RISK CATEGORY: High Risk  Suicide Risk Assessment: Patient has following modifiable risk factors for suicide: recklessness and medication noncompliance, which we are addressing by continuing outpatient psychiatric medication of Seroquel , offering substance abuse treatment which patient has declined.. Patient has following non-modifiable or demographic risk factors for suicide: male gender, impulsivity, and substance use Patient has the following protective factors against suicide: Access to outpatient mental health care, Supportive family, Cultural, spiritual, or religious beliefs that discourage suicide, and Frustration tolerance  Thank you for this consult request. Recommendations have been communicated to the primary team.  We will recommend outpatient psychiatric resources at this time at this time.   Elveria VEAR Batter, NP       History of Present Illness  Relevant Aspects of Hospital ED Course:  Admitted on 04/08/2024 for with complaints of feeling homicidal and chronic foot pain. Of note patient presented to the [facility] ED last night with complaints  right foot pain and discharged.   Patient Report:  my legs are not working and I dont have my walker  Psych ROS:  Depression:  Endorses some depression related to his substance use Anxiety: Anxiety when he is not using Mania (lifetime and current): Denies at this time-reports a history of Psychosis: (lifetime and current): Denies at this time-reports a history of  Collateral information:  Declines for any collateral to be contacted  Review of Systems  Constitutional:  Negative for chills and fever.  Respiratory:  Negative for cough.   Musculoskeletal:  Positive for joint pain.       Foot pain ,states he can't walk  Psychiatric/Behavioral:  Positive for depression and substance abuse. The patient is nervous/anxious.      Psychiatric and Social History  Psychiatric History:  Information collected  from patient and chart review Prev Dx/Sx: Schizoaffective disorder, mdd, polysubstance abuse, anxiety Current Psych Provider: None-discussed-outpatient psychiatric services for [facility] Home Meds (current): Seroquel  100 mg nightly Previous Med Trials: Yes Therapy: None   Prior Psych Hospitalization: Yes Prior Self Harm: Denies at this time Prior Violence: Denies   Family Psych History: Denies Family Hx suicide: Denies   Social History:  Developmental Hx: Unknown Educational Hx: Patient did not graduate high school Occupational Hx: Unemployed Legal Hx: Yes Living Situation: Homeless-states he will not stop using drugs and his girlfriend kicked him out Spiritual Hx: Yes Access to weapons/lethal means: Denies  Substance History Alcohol: Endorses Type of alcohol drinks what ever he can get his hand on.  Last drink was last night.  BAL 100 on arrival. Last Drink believes his last drink was last night Number of drinks per day unsure of how many drinks he has per day History of alcohol withdrawal seizures denies History of DT's denies Tobacco: Endorses Illicit drugs: Yes Prescription drug abuse: Yes Rehab hx: This  Exam Findings  Physical Exam:  Vital Signs:  Temp:  [98 F (36.7 C)-99.1 F (37.3 C)] 98.6 F (37 C) (07/07 1209) Pulse Rate:  [89-102] 98 (07/07 1209) Resp:  [16-18] 18 (07/07 1209) BP: (116-134)/(69-90) 116/69 (07/07 1209) SpO2:  [94 %-100 %] 100 % (07/07 1209) Weight:  [61.2 kg] 61.2 kg (07/07 0836) Blood pressure 116/69, pulse 98, temperature 98.6 F (37 C), resp. rate 18, height 5' 5 (1.651 m), weight 61.2 kg, SpO2 100%. Body mass index is 22.45 kg/m.  Physical Exam Constitutional:      Appearance: Normal appearance.  Eyes:     General:        Right eye: No discharge.        Left eye: No discharge.  Cardiovascular:     Rate and Rhythm: Normal rate.  Pulmonary:     Effort: Pulmonary effort is normal. No respiratory distress.  Musculoskeletal:      Cervical back: Normal range of motion.  Neurological:     Mental Status: He is alert and oriented to person, place, and time.  Psychiatric:        Attention and Perception: Attention and perception normal.        Mood and Affect: Mood is anxious and depressed. Affect is not labile.        Speech: Speech normal.        Behavior: Behavior normal. Behavior is cooperative.        Thought Content: Thought content normal.        Cognition and Memory: Cognition normal.        Judgment: Judgment is impulsive.     Mental Status Exam: General Appearance: Disheveled  Orientation:  Full (Time, Place, and Person)  Memory:  Immediate;   Fair Recent;  Fair Remote;   Fair  Concentration:  Concentration: Good and Attention Span: Good  Recall:  Fair  Attention  Good  Eye Contact:  Good  Speech:  Clear and Coherent and Normal Rate  Language:  Good  Volume:  Normal  Mood: frustrated, I need my walker  Affect:  Congruent  Thought Process:  Coherent  Thought Content:  Logical  Suicidal Thoughts:  No  Homicidal Thoughts:  No  Judgement:  Fair  Insight:  Fair  Psychomotor Activity:  Normal  Akathisia:  No  Fund of Knowledge:  Fair      Assets:  Manufacturing systems engineer Desire for Improvement Financial Resources/Insurance Leisure Time Physical Health Resilience Social Support  Cognition:  WNL  ADL's:  Intact  AIMS (if indicated):        Other History   These have been pulled in through the EMR, reviewed, and updated if appropriate.  Family History:  The patient's family history includes Stroke in an other family member.  Medical History: Past Medical History:  Diagnosis Date   Alcoholic gastritis    Bipolar 1 disorder (HCC)    Dental abscess    Depression    Drug abuse (HCC)    ETOH abuse    Hallucination    Hypertension    Schizo affective schizophrenia (HCC)    Substance abuse (HCC)     Surgical History: Past Surgical History:  Procedure Laterality Date   TOTAL HIP  ARTHROPLASTY Left 02/23/2024   Procedure: ARTHROPLASTY, HIP, TOTAL,POSTERIOR APPROACH;  Surgeon: Jerri Kay HERO, MD;  Location: [facility] OR;  Service: Orthopedics;  Laterality: Left;  LEFT POSTERIOR HIP RESECTION ARTHROPLASTY WITH ANTIBIOTIC SPACER     Medications:   Current Facility-Administered Medications:    folic acid  (FOLVITE ) tablet 1 mg, 1 mg, Oral, Daily, Meredith, Savannah F, PA-C, 1 mg at 04/08/24 1025   ibuprofen  (ADVIL ) tablet 400 mg, 400 mg, Oral, Q6H PRN, Hoy, Savannah F, PA-C   multivitamin with minerals tablet 1 tablet, 1 tablet, Oral, Daily, Hoy Fraction F, PA-C, 1 tablet at 04/08/24 1026   thiamine  (VITAMIN B1) tablet 100 mg, 100 mg, Oral, Daily, 100 mg at 04/08/24 1026 **OR** thiamine  (VITAMIN B1) injection 100 mg, 100 mg, Intravenous, Daily, Hoy, Savannah F, PA-C  Current Outpatient Medications:    cefadroxil  (DURICEF) 500 MG capsule, Take 2 capsules (1,000 mg total) by mouth 2 (two) times daily for 25 days. (Patient not taking: Reported on 04/08/2024), Disp: 100 capsule, Rfl: 0   QUEtiapine  (SEROQUEL ) 50 MG tablet, Take 50 mg by mouth at bedtime. (Patient not taking: Reported on 04/08/2024), Disp: , Rfl:   Allergies: Allergies  Allergen Reactions   Codeine Other (See Comments)    Unknown childhood reaction   Fish Allergy Other (See Comments)    Patient does not wish to eat ANY fish- reaction unclear   Shellfish Allergy Other (See Comments)    Patient does not wish to eat ANY shellfish- reaction unclear    Elveria VEAR Batter, NP

## 2024-04-08 NOTE — ED Notes (Signed)
Pt provided with bus pass per request.

## 2024-04-09 ENCOUNTER — Emergency Department (HOSPITAL_COMMUNITY)
Admission: EM | Admit: 2024-04-09 | Discharge: 2024-04-10 | Disposition: A | Discharge status: Other Acute Inpatient Hospital | Payer: MEDICAID | Attending: Emergency Medicine | Admitting: Emergency Medicine

## 2024-04-09 ENCOUNTER — Other Ambulatory Visit: Payer: Self-pay

## 2024-04-09 ENCOUNTER — Emergency Department (HOSPITAL_COMMUNITY): Payer: MEDICAID

## 2024-04-09 ENCOUNTER — Encounter (HOSPITAL_COMMUNITY): Payer: Self-pay | Admitting: *Deleted

## 2024-04-09 DIAGNOSIS — I10 Essential (primary) hypertension: Secondary | ICD-10-CM | POA: Diagnosis not present

## 2024-04-09 DIAGNOSIS — R45851 Suicidal ideations: Secondary | ICD-10-CM | POA: Diagnosis not present

## 2024-04-09 DIAGNOSIS — Z96642 Presence of left artificial hip joint: Secondary | ICD-10-CM | POA: Insufficient documentation

## 2024-04-09 DIAGNOSIS — F250 Schizoaffective disorder, bipolar type: Secondary | ICD-10-CM | POA: Diagnosis present

## 2024-04-09 DIAGNOSIS — F319 Bipolar disorder, unspecified: Secondary | ICD-10-CM

## 2024-04-09 DIAGNOSIS — F1910 Other psychoactive substance abuse, uncomplicated: Secondary | ICD-10-CM | POA: Diagnosis present

## 2024-04-09 LAB — COMPREHENSIVE METABOLIC PANEL WITH GFR
ALT: 33 U/L (ref 0–44)
AST: 78 U/L — ABNORMAL HIGH (ref 15–41)
Albumin: 4 g/dL (ref 3.5–5.0)
Alkaline Phosphatase: 120 U/L (ref 38–126)
Anion gap: 14 (ref 5–15)
BUN: <5 mg/dL — ABNORMAL LOW (ref 6–20)
CO2: 22 mmol/L (ref 22–32)
Calcium: 8.7 mg/dL — ABNORMAL LOW (ref 8.9–10.3)
Chloride: 100 mmol/L (ref 98–111)
Creatinine, Ser: 0.59 mg/dL — ABNORMAL LOW (ref 0.61–1.24)
GFR, Estimated: >60 mL/min (ref >60)
Glucose, Bld: 94 mg/dL (ref 70–99)
Potassium: 3.2 mmol/L — ABNORMAL LOW (ref 3.5–5.1)
Sodium: 136 mmol/L (ref 135–145)
Total Bilirubin: 0.7 mg/dL (ref 0.0–1.2)
Total Protein: 8.5 g/dL — ABNORMAL HIGH (ref 6.5–8.1)

## 2024-04-09 LAB — CBC WITH DIFFERENTIAL/PLATELET
Abs Immature Granulocytes: 0.02 K/uL (ref 0.00–0.07)
Basophils Absolute: 0.1 K/uL (ref 0.0–0.1)
Basophils Relative: 2 %
Eosinophils Absolute: 0.1 K/uL (ref 0.0–0.5)
Eosinophils Relative: 1 %
HCT: 34.8 % — ABNORMAL LOW (ref 39.0–52.0)
Hemoglobin: 11.2 g/dL — ABNORMAL LOW (ref 13.0–17.0)
Immature Granulocytes: 0 %
Lymphocytes Relative: 30 %
Lymphs Abs: 2.4 K/uL (ref 0.7–4.0)
MCH: 27.5 pg (ref 26.0–34.0)
MCHC: 32.2 g/dL (ref 30.0–36.0)
MCV: 85.5 fL (ref 80.0–100.0)
Monocytes Absolute: 0.6 K/uL (ref 0.1–1.0)
Monocytes Relative: 7 %
Neutro Abs: 4.9 K/uL (ref 1.7–7.7)
Neutrophils Relative %: 60 %
Platelets: 411 K/uL — ABNORMAL HIGH (ref 150–400)
RBC: 4.07 MIL/uL — ABNORMAL LOW (ref 4.22–5.81)
RDW: 13.2 % (ref 11.5–15.5)
WBC: 8.1 K/uL (ref 4.0–10.5)
nRBC: 0 % (ref 0.0–0.2)

## 2024-04-09 LAB — RAPID URINE DRUG SCREEN, HOSP PERFORMED
Amphetamines: POSITIVE — AB
Barbiturates: NOT DETECTED
Benzodiazepines: POSITIVE — AB
Cocaine: POSITIVE — AB
Opiates: NOT DETECTED
Tetrahydrocannabinol: NOT DETECTED

## 2024-04-09 LAB — ETHANOL: Alcohol, Ethyl (B): 125 mg/dL — ABNORMAL HIGH (ref <15)

## 2024-04-09 MED ORDER — HYDROXYZINE HCL 25 MG PO TABS
25.0000 mg | ORAL_TABLET | Freq: Three times a day (TID) | ORAL | Status: DC | PRN
  Administered 2024-04-09 (×2): 25 mg via ORAL
  Filled 2024-04-09 (×2): qty 1

## 2024-04-09 MED ORDER — QUETIAPINE FUMARATE 25 MG PO TABS
12.5000 mg | ORAL_TABLET | ORAL | Status: AC
  Administered 2024-04-09: 12.5 mg via ORAL
  Filled 2024-04-09: qty 1

## 2024-04-09 MED ORDER — IBUPROFEN 800 MG PO TABS
800.0000 mg | ORAL_TABLET | Freq: Once | ORAL | Status: AC
  Administered 2024-04-09: 800 mg via ORAL
  Filled 2024-04-09: qty 1

## 2024-04-09 MED ORDER — QUETIAPINE FUMARATE 50 MG PO TABS
50.0000 mg | ORAL_TABLET | Freq: Every day | ORAL | Status: DC
  Administered 2024-04-09: 50 mg via ORAL
  Filled 2024-04-09: qty 1

## 2024-04-09 MED ORDER — OXYCODONE-ACETAMINOPHEN 5-325 MG PO TABS
1.0000 | ORAL_TABLET | Freq: Once | ORAL | Status: AC
  Administered 2024-04-09: 1 via ORAL
  Filled 2024-04-09: qty 1

## 2024-04-09 NOTE — ED Provider Notes (Addendum)
 I was called to bedside as the patient has having chronic left hip pain as well as chronic right foot pain.  Chart review shows that the patient recently had left hip surgery/replacement in May 2025 secondary to osteomyelitis.  He states since his surgery his pain has not been controlled which is one of the main driving force is for him to get high in the streets.  He is also complaining of right foot pain.  States that has been compensating for the left hip pain and now he is having right foot pain, sometimes associated with swelling.  X-ray imaging of the foot yesterday showed no acute finding.  Will plan to get x-ray imaging of the left hip and treat his pain.   Bari Roxie HERO, DO 04/09/24 1807    Bari Roxie HERO, DO 04/09/24 1836

## 2024-04-09 NOTE — ED Notes (Signed)
 Labs obtained. Comparison from <24 hrs ago in ED. EDPA in to see pt in triage. TTS ordered.

## 2024-04-09 NOTE — Progress Notes (Signed)
 04/09/2024  1600 Notified Dr MARLA. Horton that patient has some concerns regarding his hip and is requesting a CT scan.
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
## 2024-04-09 NOTE — ED Triage Notes (Addendum)
 Returns to ED. Seen 3x in last week. See recent notes. Here for SI. Hesitation marks/ abrasions noted to L wrist. No active bleeding. Alert, NAD, calm, interactive, interested, polite, cooperative, steady gait. Denies hallucinations. Last ETOH and crack use yesterday. Mentions chronic pain and diarrhea, but states, those are beside the point, I am here for MH reasons. SI protocol initiated.

## 2024-04-09 NOTE — ED Provider Notes (Signed)
  EMERGENCY DEPARTMENT AT [facility] Provider Note   CSN: [id number] Arrival date & time: 04/09/24  9295     Patient presents with: Suicidal   Mark Hammond is a 33 y.o. male.   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 Patient yesterday by TTS team, returns today as he feels I am still having mental health issues .  He reports he is also withdrawing from crack, cocaine , alcohol with his last intake yesterday.  He also reports he has multiple chronic complaints such as foot pain, diarrhea, but is here for mental health. Does not have a suicidal plan. He denies any medical reason for his visit.   The history is provided by the patient.       Prior to Admission medications   Medication Sig Start Date End Date Taking? Authorizing Provider  cefadroxil  (DURICEF) 500 MG capsule Take 2 capsules (1,000 mg total) by mouth 2 (two) times daily for 25 days. Patient not taking: Reported on 04/08/2024 03/28/24 04/22/24  Griselda Norris, MD  QUEtiapine  (SEROQUEL ) 50 MG tablet Take 50 mg by mouth at bedtime. Patient not taking: Reported on 04/08/2024    [provider]    Allergies: Codeine, Fish allergy, and Shellfish allergy    Review of Systems  Constitutional:  Negative for chills and fever.  HENT:  Negative for sore throat.   Respiratory:  Negative for shortness of breath.   Cardiovascular:  Negative for chest pain.  Gastrointestinal:  Negative for abdominal pain, nausea and vomiting.  Genitourinary:  Negative for flank pain.  Musculoskeletal:  Negative for back pain.  Psychiatric/Behavioral:  Positive for behavioral problems, self-injury and suicidal ideas.   All other systems reviewed and are negative.   Updated Vital  Signs BP 115/75   Pulse 100   Temp 98.2 F (36.8 C)   Resp 18   Ht 5' 5 (1.651 m)   Wt 60.7 kg   SpO2 98%   BMI 22.28 kg/m   Physical Exam Vitals and nursing note reviewed.  Constitutional:      Appearance: Normal appearance.  HENT:     Head: Normocephalic and atraumatic.     Mouth/Throat:     Mouth: Mucous membranes are moist.  Cardiovascular:     Rate and Rhythm: Normal rate.  Pulmonary:     Effort: Pulmonary effort is normal.  Abdominal:     General: Abdomen is flat.  Musculoskeletal:     Cervical back: Normal range of motion and neck supple.  Skin:    General: Skin is warm and dry.  Neurological:     Mental Status: He is alert and oriented to person, place, and time.     (all labs ordered are listed, but only abnormal results are displayed) Labs Reviewed  COMPREHENSIVE METABOLIC PANEL WITH GFR - Abnormal; Notable for the following components:      Result Value   Potassium 3.2 (*)    BUN <5 (*)    Creatinine, Ser 0.59 (*)    Calcium 8.7 (*)    Total Protein 8.5 (*)    AST 78 (*)    All other components within normal limits  ETHANOL - Abnormal; Notable for the following components:   Alcohol, Ethyl (B) 125 (*)    All other  components within normal limits  RAPID URINE DRUG SCREEN, HOSP PERFORMED - Abnormal; Notable for the following components:   Cocaine  POSITIVE (*)    Benzodiazepines POSITIVE (*)    Amphetamines POSITIVE (*)    All other components within normal limits  CBC WITH DIFFERENTIAL/PLATELET - Abnormal; Notable for the following components:   RBC 4.07 (*)    Hemoglobin 11.2 (*)    HCT 34.8 (*)    Platelets 411 (*)    All other components within normal limits    EKG: None  Radiology: DG Foot Complete Right Result Date: 04/08/2024 EXAM: 3 or more VIEW(S) XRAY OF THE RIGHT FOOT 04/08/2024 05:26:15 AM COMPARISON: None available. CLINICAL HISTORY: Pain. Via [facility] with complaints of right foot pain that started tonight. FINDINGS: BONES AND  JOINTS: No acute fracture. No focal osseous lesion. No joint dislocation. SOFT TISSUES: The soft tissues are unremarkable. IMPRESSION: 1. No significant abnormality. Electronically signed by: Lonni Necessary MD 04/08/2024 06:14 AM EDT RP Workstation: [id number]     Procedures   Medications Ordered in the ED  QUEtiapine  (SEROQUEL ) tablet 50 mg (has no administration in time range)  hydrOXYzine  (ATARAX ) tablet 25 mg (25 mg Oral Given 04/09/24 1017)  QUEtiapine  (SEROQUEL ) tablet 12.5 mg (12.5 mg Oral Given 04/09/24 1017)    Clinical Course as of 04/09/24 1450  Tue Apr 09, 2024  1105 COCAINE (!): POSITIVE [JS]  1105 Benzodiazepines(!): POSITIVE [JS]  1105 COCAINE (!): POSITIVE [JS]  1105 Amphetamines(!): POSITIVE [JS]    Clinical Course User Index [JS] Leighton Luster, PA-C                                 Medical Decision Making Amount and/or Complexity of Data Reviewed Labs: ordered. Decision-making details documented in ED Course.   Patient presented to the ED for the second time in less than 24 hours for mental health evaluation.  According to records patient was TTS evaluated yesterday, he was discharged afterwards.  Labs from 24 hours ago were reviewed by me and his white blood cell count was normal, ethanol level was 100, CMP did show some slight low potassium, creatinine level was within normal limits.  UDS was positive for cocaine , benzos, amphetamines.  According to records previously reviewed, does look like patient was awaiting placement for possibly [place] at the end of June.  Patient at this time without any medical complaint is cleared for further psychiatric evaluation.  CMP remains the same, CBC unchanged.  UDS is positive for cocaine , amphetamines, benzos, cocaine .  He also tells me that he might be withdrawing from alcohol, however his ethanol level on today's visit is 125.  Patient remains medically clear for psychiatric consultation.TTS placed.   2:39 PM according to  social work note, patient does meet criteria for inpatient prior Elveria Batter NP.  Patient has been medically clear and now will need psychiatric inpatient.   Portions of this note were generated with Scientist, clinical (histocompatibility and immunogenetics). Dictation errors may occur despite best attempts at proofreading.      Final diagnoses:  Suicidal ideation    ED Discharge Orders     None          Udell Mazzocco, PA-C 04/09/24 [address] Megan L, DO 04/10/24 [id number]

## 2024-04-09 NOTE — Progress Notes (Signed)
 LCSW Progress Note  [id number]   Mark Hammond  04/09/2024  1:44 PM  Description:   Inpatient Psychiatric Referral  Patient was recommended inpatient per Elveria Batter NP. There are no available beds at [facility], per [facility] Cherylynn Ernst RN. Patient was referred to the following out of network facilities:   [facility] Provider Address Phone [facility]  [address] [place] [address] [phone]  [facility] Patient Placement  [facility], [place] [phone]  [facility] [address]  [place] [address] [phone]  [facility]  [address] [place] [address] [phone]  [facility]  [address] [place], [place] [address] [phone]  [facility]  [address] [place] [address] [phone]  [facility]  [address] [place] [address] [phone]  [facility]  [address], [place] [address] [phone]  [facility] EFAX  [address] [place], [place] [phone]  [facility]  [address] [place] [address] [phone]  [facility]  [address], [place] [address] [phone]       Situation ongoing, CSW to continue following and update chart as more information becomes available.      Mark Hammond, MSW, LCSW  04/09/2024 1:44 PM

## 2024-04-09 NOTE — Progress Notes (Signed)
 Pt has been accepted to [facility] on  04/09/2024 Bed assignment: Main campus  Pt meets inpatient criteria per Elveria Batter NP   Attending Physician will be Millie Manners, MD  Report can be called to: [phone] (this is a pager, please leave call-back number when giving report)  Pt can arrive after 8 AM  Care Team Notified: Warrick Lesches, RN

## 2024-04-09 NOTE — Progress Notes (Signed)
 Inpatient Psychiatric Referral   Patient was recommended inpatient per Elveria Batter NP. There are no available beds at [facility], per [facility] Cherylynn Ernst RN. Patient was referred to the following out of network facilities:   Destination  Service Provider Request Status Address Phone Fax  [facility]  Pending - Request Sent [address], [place] [address] [phone]  [facility] Patient Placement  Pending - Request [facility], [place] [phone]  [facility] [place] [facility]  Pending - Request [place] [address] [phone]  [facility]  Pending - Request Sent [address] [place] [address] [phone]  [facility]  Pending - Request Sent [address] [place], [place] [address] [phone]  [facility]  Pending - Request Sent [address] [place] [address] [phone]  [facility]  Pending - Request Sent [address] [place] [address] [phone]  [facility]  Pending - Request Sent [address] [place] [address] [phone]  [facility]  Pending - Request Sent [address] [place] [phone]  [facility]  Pending - Request Sent [address] [place] [address] [phone]  [facility]  Pending - Request [facility] [address], [place] [address] [phone]  [facility]  Pending - Request Sent [address] [place] [address] [phone]  [facility]  Pending - Request Sent [address] [place] [address] [phone]  [facility]  Pending - Request Sent [address] [place], [place] [address] [phone]  [facility]  Pending - Request Sent [address], [place] [address] [phone]    Situation ongoing, CSW to continue following and update chart as more information becomes available.  Harrie Sofia MSW, LCSWA 04/09/2024  6:30PM

## 2024-04-10 ENCOUNTER — Emergency Department (HOSPITAL_COMMUNITY): Payer: MEDICAID

## 2024-04-10 ENCOUNTER — Telehealth: Payer: Self-pay

## 2024-04-10 MED ORDER — CEFADROXIL 500 MG PO CAPS
1000.0000 mg | ORAL_CAPSULE | Freq: Two times a day (BID) | ORAL | Status: DC
  Administered 2024-04-10: 1000 mg via ORAL
  Filled 2024-04-10 (×2): qty 2

## 2024-04-10 MED ORDER — ACETAMINOPHEN 325 MG PO TABS
975.0000 mg | ORAL_TABLET | Freq: Once | ORAL | Status: AC
  Administered 2024-04-10: 975 mg via ORAL
  Filled 2024-04-10: qty 3

## 2024-04-10 NOTE — ED Provider Notes (Signed)
  Physical Exam  BP 107/60 (BP Location: Left Arm)   Pulse 69   Temp 99.2 F (37.3 C) (Oral)   Resp 16   Ht 5' 5 (1.651 m)   Wt 60.7 kg   SpO2 100%   BMI 22.28 kg/m   Physical Exam  Procedures  Procedures  ED Course / MDM   Clinical Course as of 04/10/24 1729  Tue Apr 09, 2024  1105 COCAINE (!): POSITIVE [JS]  1105 Benzodiazepines(!): POSITIVE [JS]  1105 COCAINE (!): POSITIVE [JS]  1105 Amphetamines(!): POSITIVE [JS]    Clinical Course User Index [JS] Soto, Johana, PA-C   Medical Decision Making Amount and/or Complexity of Data Reviewed Labs: ordered. Decision-making details documented in ED Course.   Patient accepted for transfer.  Now await MRI.  Can be followed as an outpatient.  Will reevaluate prior to transfer.  Patient back from MRI.  Reevaluated.  Will transfer        Mark Lot, MD 04/10/24 1753

## 2024-04-10 NOTE — Consult Note (Signed)
 Patient expressed to nursing staff a preference to not go to [facility].  Talked with patient about the current availability of inpatient beds in this area.  Patient agreed to accept the available bed at [facility].

## 2024-04-10 NOTE — ED Provider Notes (Signed)
 Emergency Medicine Observation Re-evaluation Note  Mark Hammond is a 33 y.o. male, seen on rounds today.  Pt initially presented to the ED for complaints of Suicidal Currently, the patient is here due to SI and polysusbstnace abuse.  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 Was on IV cefazolin  until 6/18 then switch to oral abx for several weeks cefadroxil  1gm BID, 7/14 ID appointment.  Chronic left hip pain, did complain of it yesterday-_XR with postsurgical changes, increased superolateral subluxation.  Has not had fevers in the ED. No leukocytosis on labs.  Has not been taking the cefadroxil --this has been ordered.   He Physical Exam  BP 117/72 (BP Location: Left Arm)   Pulse 83   Temp 99.7 F (37.6 C) (Oral)   Resp 16   Ht 5' 5 (1.651 m)   Wt 60.7 kg   SpO2 98%   BMI 22.28 kg/m  Physical Exam General: NAD Cardiac: RR Lungs: even unlabored Psych: NA  ED Course / MDM  EKG:   I have reviewed the labs performed to date as well as medications administered while in observation.  Recent changes in the last 24 hours include arrived, XR completed.  Plan  Current plan is for inpatient--has been accepted to [facility].  Spoke with Dr. Jerri ortho regarding hip pain: Patient needs to be using walker. Has been walking on spacer and should only be using it for transfers. Toe touch weight bearing.  Do not feel repeat imaging is of benefit.   I have ordered MRI foot/ankle because of his hx of osteo and pain in this location.  He is not showing signs of sepsis, no fever, no leukocytosis and do feel that at this time it is not unreasonable for him to go to [facility] if they come to transfer him before this is done--however if he can have this evaluation while in the ED it would be prudent to obtain given his history of IVDU.  He is essentially medically cleared to go  if they are able to take him and continue outpt work up if right foot and ankle pain persists.  Needs to continue the cefadroxil  as ordered and follow up with ID as outpatient.        Dreama Longs, MD 04/10/24 [phone]

## 2024-04-10 NOTE — Discharge Instructions (Signed)
 Follow-up as an outpatient with [facility] for further workup of the infection.  Continue the antibiotics.

## 2024-04-10 NOTE — Telephone Encounter (Signed)
 Mark Hammond from [facility] called for a consult for foot pain. He is a Dr Jerri patient. Call back Dr Dreama [phone]

## 2024-04-10 NOTE — Telephone Encounter (Signed)
 This pt is at [facility] Dr. Dreama called [phone] this pt was seen for suicidal ideation and reported left hip pain. He is s/p a left THR on 02/23/2024 with Dr. Jerri. Romero yesterday shows  increased superolateral sublux femoral head. He also c/o right foot pain and the xrays were negative. Calling for consult. Sending to Dr. Jerri and assistant. Dr. Harden is on office call but this is a surgical post op of Dr. Jerri

## 2024-04-15 ENCOUNTER — Inpatient Hospital Stay: Payer: MEDICAID | Admitting: Internal Medicine

## 2024-04-15 ENCOUNTER — Telehealth: Payer: Self-pay

## 2024-04-15 NOTE — Telephone Encounter (Signed)
 Called patient to see if he would be able to make it to today's appointment. His uncle answered, he hasn't seen Sanad in a few days, but will ask him to give us  a call back if he hears from him.   Pieper Kasik, BSN, RN

## 2024-04-27 ENCOUNTER — Other Ambulatory Visit: Payer: Self-pay

## 2024-04-27 ENCOUNTER — Emergency Department (HOSPITAL_COMMUNITY)
Admission: EM | Admit: 2024-04-27 | Discharge: 2024-04-28 | Disposition: A | Discharge status: Other Acute Inpatient Hospital | Payer: MEDICAID | Attending: Emergency Medicine | Admitting: Emergency Medicine

## 2024-04-27 DIAGNOSIS — R45851 Suicidal ideations: Secondary | ICD-10-CM | POA: Diagnosis not present

## 2024-04-27 DIAGNOSIS — R45850 Homicidal ideations: Secondary | ICD-10-CM | POA: Insufficient documentation

## 2024-04-27 DIAGNOSIS — F1990 Other psychoactive substance use, unspecified, uncomplicated: Secondary | ICD-10-CM | POA: Diagnosis not present

## 2024-04-27 DIAGNOSIS — F1910 Other psychoactive substance abuse, uncomplicated: Secondary | ICD-10-CM | POA: Diagnosis not present

## 2024-04-27 DIAGNOSIS — Z96642 Presence of left artificial hip joint: Secondary | ICD-10-CM | POA: Insufficient documentation

## 2024-04-27 DIAGNOSIS — R000 Tachycardia, unspecified: Secondary | ICD-10-CM | POA: Insufficient documentation

## 2024-04-27 DIAGNOSIS — Z79899 Other long term (current) drug therapy: Secondary | ICD-10-CM | POA: Diagnosis not present

## 2024-04-27 DIAGNOSIS — F1920 Other psychoactive substance dependence, uncomplicated: Secondary | ICD-10-CM | POA: Diagnosis present

## 2024-04-27 DIAGNOSIS — R456 Violent behavior: Secondary | ICD-10-CM | POA: Diagnosis present

## 2024-04-27 DIAGNOSIS — F259 Schizoaffective disorder, unspecified: Secondary | ICD-10-CM | POA: Diagnosis present

## 2024-04-27 DIAGNOSIS — M25552 Pain in left hip: Secondary | ICD-10-CM | POA: Diagnosis not present

## 2024-04-27 DIAGNOSIS — F29 Unspecified psychosis not due to a substance or known physiological condition: Secondary | ICD-10-CM | POA: Diagnosis not present

## 2024-04-27 DIAGNOSIS — F329 Major depressive disorder, single episode, unspecified: Secondary | ICD-10-CM | POA: Diagnosis present

## 2024-04-27 LAB — CBC WITH DIFFERENTIAL/PLATELET
Abs Immature Granulocytes: 0.04 K/uL (ref 0.00–0.07)
Basophils Absolute: 0.1 K/uL (ref 0.0–0.1)
Basophils Relative: 1 %
Eosinophils Absolute: 0 K/uL (ref 0.0–0.5)
Eosinophils Relative: 0 %
HCT: 32.3 % — ABNORMAL LOW (ref 39.0–52.0)
Hemoglobin: 10.3 g/dL — ABNORMAL LOW (ref 13.0–17.0)
Immature Granulocytes: 1 %
Lymphocytes Relative: 22 %
Lymphs Abs: 1.8 K/uL (ref 0.7–4.0)
MCH: 26.3 pg (ref 26.0–34.0)
MCHC: 31.9 g/dL (ref 30.0–36.0)
MCV: 82.6 fL (ref 80.0–100.0)
Monocytes Absolute: 1.1 K/uL — ABNORMAL HIGH (ref 0.1–1.0)
Monocytes Relative: 13 %
Neutro Abs: 5.4 K/uL (ref 1.7–7.7)
Neutrophils Relative %: 63 %
Platelets: 299 K/uL (ref 150–400)
RBC: 3.91 MIL/uL — ABNORMAL LOW (ref 4.22–5.81)
RDW: 13.5 % (ref 11.5–15.5)
WBC: 8.4 K/uL (ref 4.0–10.5)
nRBC: 0 % (ref 0.0–0.2)

## 2024-04-27 LAB — BASIC METABOLIC PANEL WITH GFR
Anion gap: 9 (ref 5–15)
BUN: 13 mg/dL (ref 6–20)
CO2: 25 mmol/L (ref 22–32)
Calcium: 8.9 mg/dL (ref 8.9–10.3)
Chloride: 98 mmol/L (ref 98–111)
Creatinine, Ser: 0.52 mg/dL — ABNORMAL LOW (ref 0.61–1.24)
GFR, Estimated: >60 mL/min (ref >60)
Glucose, Bld: 128 mg/dL — ABNORMAL HIGH (ref 70–99)
Potassium: 3.6 mmol/L (ref 3.5–5.1)
Sodium: 132 mmol/L — ABNORMAL LOW (ref 135–145)

## 2024-04-27 LAB — RAPID URINE DRUG SCREEN, HOSP PERFORMED
Amphetamines: NOT DETECTED
Barbiturates: NOT DETECTED
Benzodiazepines: NOT DETECTED
Cocaine: POSITIVE — AB
Opiates: POSITIVE — AB
Tetrahydrocannabinol: POSITIVE — AB

## 2024-04-27 LAB — ETHANOL: Alcohol, Ethyl (B): <15 mg/dL (ref <15)

## 2024-04-27 MED ORDER — KETOROLAC TROMETHAMINE 15 MG/ML IJ SOLN
30.0000 mg | Freq: Once | INTRAMUSCULAR | Status: AC
  Administered 2024-04-27: 30 mg via INTRAMUSCULAR
  Filled 2024-04-27: qty 2

## 2024-04-27 MED ORDER — NICOTINE 21 MG/24HR TD PT24
21.0000 mg | MEDICATED_PATCH | Freq: Every day | TRANSDERMAL | Status: DC
  Administered 2024-04-27 – 2024-04-28 (×2): 21 mg via TRANSDERMAL
  Filled 2024-04-27 (×2): qty 1

## 2024-04-27 NOTE — ED Triage Notes (Signed)
 BIBA from [facility] for pain in left hip, radiating to his knee- recent hip replacement surgery. Pt also reports having SI/HI with aggressive outbursts the last 4 days. Last used heroin today.

## 2024-04-27 NOTE — ED Notes (Signed)
 Pt moved to room 4 for TTS assessment

## 2024-04-27 NOTE — ED Provider Notes (Signed)
 [facility] EMERGENCY DEPARTMENT AT [facility] Provider Note   CSN: [id number] Arrival date & time: 04/27/24  1513     Patient presents with: Suicidal and Hip Pain   Mark Hammond is a 33 y.o. male.   Patient is a 33 year old male with a history of schizophrenia, polysubstance abuse, alcohol abuse, intermittent heroin use with left hip replacement after septic joint from drug abuse who is presenting today with complaint of ongoing pain in his left hip since the surgery but reports that he is having homicidal and suicidal ideations, outburst of anger.  He has been drinking and using drugs over the last 3 days just to help cope with the pain he has in his hip.  He has not been taking any of his psychiatric medicines in the last day since he has been using.  He has no specific plan of who he would hurt or how he would hurt them.  He has not had anything to eat for about a day reports he has not been sleeping.  He has not had any fever, redness or drainage from his surgical site.  Last used heroin at about 11:00 this morning.  The history is provided by the patient.  Hip Pain       Prior to Admission medications   Medication Sig Start Date End Date Taking? Authorizing Provider  ARIPiprazole  (ABILIFY ) 5 MG tablet Take 5 mg by mouth daily.   Yes [provider]  QUEtiapine  (SEROQUEL ) 50 MG tablet Take 50 mg by mouth at bedtime.   Yes [provider]  sertraline  (ZOLOFT ) 50 MG tablet Take 50 mg by mouth daily.   Yes [provider]    Allergies: Fish allergy, Shellfish allergy, and Codeine    Review of Systems  Updated Vital Signs BP 133/87 (BP Location: Right Arm)   Pulse (!) 117   Temp 98 F (36.7 C) (Oral)   Resp 16   SpO2 99%   Physical Exam Vitals and nursing note reviewed.  Constitutional:      General: He is not in acute distress.    Appearance: He is well-developed.     Comments: Disheveled, dirty  HENT:     Head: Normocephalic and  atraumatic.  Eyes:     Conjunctiva/sclera: Conjunctivae normal.     Pupils: Pupils are equal, round, and reactive to light.  Cardiovascular:     Rate and Rhythm: Regular rhythm. Tachycardia present.     Heart sounds: No murmur heard. Pulmonary:     Effort: Pulmonary effort is normal. No respiratory distress.     Breath sounds: Normal breath sounds. No wheezing or rales.  Abdominal:     General: There is no distension.     Palpations: Abdomen is soft.     Tenderness: There is no abdominal tenderness. There is no guarding or rebound.  Musculoskeletal:        General: No tenderness. Normal range of motion.     Cervical back: Normal range of motion and neck supple.     Comments: Well-healed left surgical scar without any erythema, warmth.  Normal range of motion.  Skin:    General: Skin is warm and dry.     Findings: No erythema or rash.  Neurological:     Mental Status: He is alert and oriented to person, place, and time.  Psychiatric:        Attention and Perception: He is inattentive.        Speech: Speech is tangential.  Behavior: Behavior is agitated.        Thought Content: Thought content includes homicidal and suicidal ideation. Thought content does not include homicidal or suicidal plan.     (all labs ordered are listed, but only abnormal results are displayed) Labs Reviewed  CBC WITH DIFFERENTIAL/PLATELET - Abnormal; Notable for the following components:      Result Value   RBC 3.91 (*)    Hemoglobin 10.3 (*)    HCT 32.3 (*)    Monocytes Absolute 1.1 (*)    All other components within normal limits  BASIC METABOLIC PANEL WITH GFR - Abnormal; Notable for the following components:   Sodium 132 (*)    Glucose, Bld 128 (*)    Creatinine, Ser 0.52 (*)    All other components within normal limits  RAPID URINE DRUG SCREEN, HOSP PERFORMED - Abnormal; Notable for the following components:   Opiates POSITIVE (*)    Cocaine  POSITIVE (*)    Tetrahydrocannabinol  POSITIVE (*)    All other components within normal limits  ETHANOL    EKG: EKG Interpretation Date/Time:  Saturday April 27 2024 16:47:28 EDT Ventricular Rate:  105 PR Interval:  136 QRS Duration:  86 QT Interval:  348 QTC Calculation: 459 R Axis:   68  Text Interpretation: Sinus tachycardia Otherwise normal ECG When compared with ECG of 09-Apr-2024 10:26, PREVIOUS ECG IS PRESENT Confirmed by Doretha Folks ([id number]) on 04/27/2024 4:59:25 PM  Radiology: No results found.   Procedures   Medications Ordered in the ED  nicotine  (NICODERM CQ  - dosed in mg/24 hours) patch 21 mg (21 mg Transdermal Patch Applied 04/27/24 1710)  ketorolac  (TORADOL ) 15 MG/ML injection 30 mg (30 mg Intramuscular Given 04/27/24 1710)                                    Medical Decision Making Amount and/or Complexity of Data Reviewed Labs: ordered. Decision-making details documented in ED Course. ECG/medicine tests: ordered and independent interpretation performed. Decision-making details documented in ED Course.  Risk OTC drugs. Prescription drug management.   Pt with multiple medical problems and comorbidities and presenting today with a complaint that caries a high risk for morbidity and mortality.  Here today with the above complaints.  Feel that the hip pain is chronic from his recent surgery and prior septic hip however patient is complaining of suicidal and homicidal ideation has been abusing substances and has not been on his psychiatric medications.  He is inattentive but does not appear to be hallucinating at this time.  He is cooperative.  Labs were ordered but patient needs mental health evaluation.  He was also given Toradol  for pain.  Patient would be a candidate for Suboxone if he starts having opiate withdrawal symptoms but currently is not having withdrawal symptoms at this time.  6:05 PM I independently interpreted patient's labs and EKG and CBC, BMP without acute findings, alcohol is  negative and UDS is positive for opiates, cocaine  and marijuana.  EKG without acute findings.  At this time patient is medically clear to see behavioral health.     Final diagnoses:  Suicidal ideation  Pain of left hip  Polysubstance abuse [facility])  Homicidal ideation    ED Discharge Orders     None          Doretha Folks, MD 04/27/24 [phone]

## 2024-04-27 NOTE — BH Assessment (Signed)
 Comprehensive Clinical Assessment (CCA) Note  04/27/2024 Mark Hammond [id number]  Chief Complaint:  Chief Complaint  Patient presents with   Suicidal   Hip Pain  Disposition: Per Roxianne Bobbitt,NP patient is recommended for inpatient admission.Disposition SW to pursue appropriate inpatient options.  The patient demonstrates the following risk factors for suicide: Chronic risk factors for suicide include: psychiatric disorder of Schizoaffective disorder, polysubstance abuse. Acute risk factors for suicide include: unemployment, social withdrawal/isolation, and loss (financial, interpersonal, professional). Protective factors for this patient include: hope for the future. Considering these factors, the overall suicide risk at this point appears to be high. Patient is not appropriate for outpatient follow up.   Mark Hammond is a 33 year old male with a history of Schizoaffective disorder, polysubstance abuse who presents voluntarily to [facility] for an assessment. Patient states he is currently homeless and struggles with mobility due to having a hip replacement. He states he had cellulitis which resulted in the need for his hip replacement. He states the difficulty ambulating and having the use a walker has caused significant depression symptoms. Patient reports isolation, crying spells, irritability, hopelessness, loss of interest to do things they enjoy, fatigue, lack of concentration, worthlessness, decreased sleep, and decreased appetite. Patient reports passive SI and denies a plan or intent at this time. He reports HI towards people around where he was staying and reports 2 days ago he got a piece of broken glass and was going to use it to try and harm someone. He reports NSSIB by cutting on his hip today.  Patient has a hx of Substance Abuse: heroin, cocaine , alcohol and marijuana. Last use was today of cocaine , heroin and alcohol. Last use of marijuana was 2 days ago. He reports consuming about 5 beers  today but is unclear of how much of the other substances he used today. He does report daily use.Patient reports auditory and visual hallucinations, seeing faces coming out of the seats, and hearing voices telling him bad things and to get high. Patient reports paranoia feeling like people are behind him or watching him.He denies access to weapons. He denies current legal issues. He denies hx of abuse. Patient is unable to contract for safety.      Visit Diagnosis:  Suicidal Ideation Homicidal Ideation Schizoaffective disorder Polysubstance abuse    CCA Screening, Triage and Referral (STR)  Patient Reported Information How did you hear about us ? Self  What Is the Reason for Your Visit/Call Today? Mark Hammond is a 33 year old male with a history of  Schizoaffective disorder, polysubstance abuse  who presents voluntarily to [facility] for an assessment. Patient states he is currently homeless and struggles with mobility due to having a hip replacement. He states he had cellulitis which resulted in the need for his hip replacement. He states the difficulty ambulating and having the use a walker has caused significant depression symptoms. Patient reports isolation, crying spells, irritability, hopelessness, loss of interest to do things they enjoy, fatigue, lack of concentration, worthlessness, decreased sleep, and decreased appetite. Patient reports passive SI and denies a plan or intent at this time. He reports HI towards people around where he was staying and reports 2 days ago he got a piece of broken glass and was going to use it to try and harm someone. He reports NSSIB by cutting on his hip today.  Patient has a hx of Substance Abuse: heroin, cocaine , alcohol and marijuana. Last use was today of cocaine , heroin and alcohol. Last use of marijuana  was 2 days ago. He reports consuming about 5 beers today but is unclear of how much of the other substances he used today. He does report daily use.Patient  reports auditory and visual hallucinations, seeing faces coming out of the seats, and hearing voices telling him bad things and to get high. Patient reports paranoia feeling like people are behind him or watching him.He denies access to weapons. He denies current legal issues. He denies hx of abuse. Patient is unable to contract for safety.  How Long Has This Been Causing You Problems? 1 wk - 1 month  What Do You Feel Would Help You the Most Today? Treatment for Depression or other mood problem; Medication(s)   Have You Recently Had Any Thoughts About Hurting Yourself? Yes  Are You Planning to Commit Suicide/Harm Yourself At This time? Yes   Flowsheet Row ED from 04/27/2024 in [facility] Emergency Department at [facility] ED from 04/09/2024 in [facility] Emergency Department at [facility] ED from 04/08/2024 in [facility] Emergency Department at [facility]  C-SSRS RISK CATEGORY High Risk High Risk High Risk    Have you Recently Had Thoughts About Hurting Someone [name]? Yes  Are You Planning to Harm Someone at This Time? Yes  Explanation: reports 2 days ago he had a piece of glass and was going to use it harm someone   Have You Used Any Alcohol or Drugs in the Past 24 Hours? Yes  How Long Ago Did You Use Drugs or Alcohol? 03/28/2024  What Did You Use and How Much? heroin, cocaine , alcohol, today   Do You Currently Have a Therapist/Psychiatrist? No  Name of Therapist/Psychiatrist:    Have You Been Recently Discharged From Any Office Practice or Programs? Yes  Explanation of Discharge From Practice/Program: reports d/c from [facility] last week after 2 week admission     CCA Screening Triage Referral Assessment Type of Contact: Tele-Assessment  Telemedicine Service Delivery: Telemedicine service delivery: This service was provided via telemedicine using a 2-way, interactive audio and video technology  Is this Initial or Reassessment? Is this  Initial or Reassessment?: Initial Assessment  Date Telepsych consult ordered in [facility]:  Date Telepsych consult ordered in [facility]: 04/27/24  Time Telepsych consult ordered in [facility]:  Time Telepsych consult ordered in [facility]: 1606  Location of Assessment: [facility] ED  Provider Location: [facility] Assessment Services   Collateral Involvement: NA   Does Patient Have a Automotive engineer Guardian? No  Legal Guardian Contact Information: n/a  Copy of Legal Guardianship Form: -- (n/a)  Legal Guardian Notified of Arrival: -- (n/a)  Legal Guardian Notified of Pending Discharge: -- (n/a)  If Minor and Not Living with Parent(s), Who has Custody? n/a  Is CPS involved or ever been involved? Never  Is APS involved or ever been involved? Never   Patient Determined To Be At Risk for Harm To Self or Others Based on Review of Patient Reported Information or Presenting Complaint? Yes, for Harm to Others  Method: Plan without intent  Availability of Means: No access or NA  Intent: Vague intent or NA  Notification Required: No need or identified person  Additional Information for Danger to Others Potential: Previous attempts  Additional Comments for Danger to Others Potential: reports 2 days ago he had a piece of glass and was going to hurt someone  Are There Guns or Other Weapons in Your Home? No  Types of Guns/Weapons: Pt denies.  Are These Weapons Safely  Secured?                            -- (n/a)  Who Could Verify You Are Able To Have These Secured: NA  Do You Have any Outstanding Charges, Pending Court Dates, Parole/Probation? denies  Contacted To Inform of Risk of Harm To Self or Others: Other: Comment (n/a)    Does Patient Present under Involuntary Commitment? No    [place] of Residence: [place]   Patient Currently Receiving the Following Services: Not Receiving Services   Determination of Need: Urgent (48 hours)   Options For Referral: Inpatient  Hospitalization     CCA Biopsychosocial Patient Reported Schizophrenia/Schizoaffective Diagnosis in Past: Yes   Strengths: Seeking Treatment   Mental Health Symptoms Depression:  Difficulty Concentrating; Fatigue; Hopelessness; Worthlessness; Sleep (too much or little); Increase/decrease in appetite; Tearfulness; Irritability; Change in energy/activity (Isolation.)   Duration of Depressive symptoms: Duration of Depressive Symptoms: Greater than two weeks   Mania:  None   Anxiety:   Difficulty concentrating; Fatigue; Restlessness; Tension; Worrying; Sleep; Irritability   Psychosis:  Hallucinations   Duration of Psychotic symptoms: Duration of Psychotic Symptoms: Greater than six months   Trauma:  None   Obsessions:  None   Compulsions:  None   Inattention:  Forgetful; Loses things   Hyperactivity/Impulsivity:  Feeling of restlessness; Fidgets with hands/feet   Oppositional/Defiant Behaviors:  Angry; Argumentative; Temper   Emotional Irregularity:  Recurrent suicidal behaviors/gestures/threats; Potentially harmful impulsivity   Other Mood/Personality Symptoms:  NA    Mental Status Exam Appearance and self-care  Stature:  Small   Weight:  Average weight   Clothing:  -- (hospital scrubs)   Grooming:  Normal   Cosmetic use:  None   Posture/gait:  Normal   Motor activity:  Not Remarkable (Pt uses a walker.)   Sensorium  Attention:  Normal   Concentration:  Normal   Orientation:  X5   Recall/memory:  Normal   Affect and Mood  Affect:  Congruent   Mood:  Depressed   Relating  Eye contact:  Normal   Facial expression:  Responsive   Attitude toward examiner:  Cooperative   Thought and Language  Speech flow: Normal   Thought content:  Appropriate to Mood and Circumstances   Preoccupation:  None   Hallucinations:  Auditory; Visual   Organization:  Patent examiner of Knowledge:  Average   Intelligence:  Average    Abstraction:  Normal   Judgement:  Poor   Reality Testing:  Distorted; Variable   Insight:  Poor   Decision Making:  Normal   Social Functioning  Social Maturity:  Impulsive; Irresponsible   Social Judgement:  Chief of Staff   Stress  Stressors:  Housing; Illness; Transitions   Coping Ability:  Exhausted; Overwhelmed   Skill Deficits:  Self-control; Responsibility; Self-care   Supports:  Support needed     Religion: Religion/Spirituality Are You A Religious Person?: No How Might This Affect Treatment?: NA  Leisure/Recreation: Leisure / Recreation Do You Have Hobbies?: Yes Leisure and Hobbies: Rap, listening to music,  Exercise/Diet: Exercise/Diet Do You Exercise?: No Have You Gained or Lost A Significant Amount of Weight in the Past Six Months?: No Do You Follow a Special Diet?: No Do You Have Any Trouble Sleeping?: Yes Explanation of Sleeping Difficulties: Pt reports, getting 2-3 hours of sleep.   CCA Employment/Education Employment/Work Situation: Employment / Work Situation Employment Situation: Unemployed Patient's Job  has Been Impacted by Current Illness: No Has Patient ever Been in the Military?: No  Education: Education Is Patient Currently Attending School?: No Last Grade Completed: 9 Did You Attend College?: No Did You Have An Individualized Education Program (IIEP): No Did You Have Any Difficulty At School?: No Patient's Education Has Been Impacted by Current Illness: No   CCA Family/Childhood History Family and Relationship History: Family history Marital status: Single Does patient have children?: No  Childhood History:  Childhood History By whom was/is the patient raised?: Grandparents Midwife.) Did patient suffer any verbal/emotional/physical/sexual abuse as a child?: No Did patient suffer from severe childhood neglect?: No Has patient ever been sexually abused/assaulted/raped as an adolescent or adult?: No Was the patient ever a  victim of a crime or a disaster?: No Witnessed domestic violence?: No Has patient been affected by domestic violence as an adult?: No       CCA Substance Use Alcohol/Drug Use: Alcohol / Drug Use Pain Medications: See MAR Prescriptions: See MAR Over the Counter: See MAR History of alcohol / drug use?: Yes Longest period of sobriety (when/how long): 1.5 months (when he was in the hospital). Negative Consequences of Use: Financial, Personal relationships, Work / School Withdrawal Symptoms: None                         ASAM's:  Six Dimensions of Multidimensional Assessment  Dimension 1:  Acute Intoxication and/or Withdrawal Potential:   Dimension 1:  Description of individual's past and current experiences of substance use and withdrawal: Pt has ongoing use of Crack Cocaine  and Alcohol, pt recently started using Heroin.  Dimension 2:  Biomedical Conditions and Complications:   Dimension 2:  Description of patient's biomedical conditions and  complications: Per chart, pt has hip surgey on 01/31/24 - 02/16/24 for Left hip/femur cellulitis, osteomyelitis and septic arthritis, pt uses a walker.  Dimension 3:  Emotional, Behavioral, or Cognitive Conditions and Complications:  Dimension 3:  Description of emotional, behavioral, or cognitive conditions and complications: Per chart, pt has the following diagnoses: schizoaffective disorder, depression, Bipolar 1 disorder (HCC), Amphetamine use disorder, severe (HCC), Opioid use disorder. Pt reports, suicidal ideations that are on and off.  Dimension 4:  Readiness to Change:  Dimension 4:  Description of Readiness to Change criteria: Patient seeking long term treatment and detox.  Dimension 5:  Relapse, Continued use, or Continued Problem Potential:  Dimension 5:  Relapse, continued use, or continued problem potential critiera description: Pt has ongoing substance use.  Dimension 6:  Recovery/Living Environment:  Dimension 6:  Recovery/Iiving  environment criteria description: Pt reports, he lives with his girlfrined.  ASAM Severity Score: ASAM's Severity Rating Score: 11  ASAM Recommended Level of Treatment: ASAM Recommended Level of Treatment: Level III Residential Treatment   Substance use Disorder (SUD) Substance Use Disorder (SUD)  Checklist Symptoms of Substance Use: Continued use despite having a persistent/recurrent physical/psychological problem caused/exacerbated by use, Continued use despite persistent or recurrent social, interpersonal problems, caused or exacerbated by use, Large amounts of time spent to obtain, use or recover from the substance(s), Persistent desire or unsuccessful efforts to cut down or control use, Repeated use in physically hazardous situations, Social, occupational, recreational activities given up or reduced due to use, Substance(s) often taken in larger amounts or over longer times than was intended  Recommendations for Services/Supports/Treatments: Recommendations for Services/Supports/Treatments Recommendations For Services/Supports/Treatments: Medication Management, Individual Therapy, Inpatient Hospitalization, Facility Based Crisis  Disposition Recommendation per psychiatric provider:  We recommend inpatient psychiatric hospitalization when medically cleared. Patient is under voluntary admission status at this time; please IVC if attempts to leave hospital.   DSM5 Diagnoses: Patient Active Problem List   Diagnosis Date Noted   Cocaine  use disorder, severe, dependence (HCC) 03/31/2024   Osteomyelitis of left hip (HCC) 02/22/2024   Cellulitis 02/20/2024   Severe sepsis (HCC) 01/31/2024   Hypokalemia 01/31/2024   Chronic alcohol abuse 01/31/2024   Anemia of chronic disease 01/31/2024   Transaminitis 01/31/2024   Bipolar 1 disorder (HCC)    Amphetamine use disorder, 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   Polysubstance dependence (HCC) 12/13/2023   Polysubstance abuse (HCC) 11/21/2023   Alcohol use  disorder 08/04/2023   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

## 2024-04-28 DIAGNOSIS — F1990 Other psychoactive substance use, unspecified, uncomplicated: Secondary | ICD-10-CM | POA: Insufficient documentation

## 2024-04-28 DIAGNOSIS — F259 Schizoaffective disorder, unspecified: Secondary | ICD-10-CM

## 2024-04-28 DIAGNOSIS — R45851 Suicidal ideations: Secondary | ICD-10-CM

## 2024-04-28 DIAGNOSIS — R45850 Homicidal ideations: Secondary | ICD-10-CM

## 2024-04-28 MED ORDER — CLONIDINE HCL 0.1 MG PO TABS: 0.1000 mg | ORAL_TABLET | ORAL | Status: DC

## 2024-04-28 MED ORDER — HYDROXYZINE HCL 25 MG PO TABS: 25.0000 mg | ORAL_TABLET | Freq: Four times a day (QID) | ORAL | Status: DC | PRN

## 2024-04-28 MED ORDER — SERTRALINE HCL 50 MG PO TABS
50.0000 mg | ORAL_TABLET | Freq: Every day | ORAL | Status: DC
  Administered 2024-04-28: 50 mg via ORAL
  Filled 2024-04-28: qty 1

## 2024-04-28 MED ORDER — ARIPIPRAZOLE 5 MG PO TABS
5.0000 mg | ORAL_TABLET | Freq: Every day | ORAL | Status: DC
Start: 2024-04-28 — End: 2024-04-28
  Administered 2024-04-28: 5 mg via ORAL
  Filled 2024-04-28: qty 1

## 2024-04-28 MED ORDER — ONDANSETRON 4 MG PO TBDP
4.0000 mg | ORAL_TABLET | Freq: Four times a day (QID) | ORAL | Status: DC | PRN
  Administered 2024-04-28: 4 mg via ORAL
  Filled 2024-04-28: qty 1

## 2024-04-28 MED ORDER — NAPROXEN 500 MG PO TABS
500.0000 mg | ORAL_TABLET | Freq: Two times a day (BID) | ORAL | Status: DC | PRN
  Administered 2024-04-28: 500 mg via ORAL
  Filled 2024-04-28: qty 1

## 2024-04-28 MED ORDER — METHOCARBAMOL 500 MG PO TABS: 500.0000 mg | ORAL_TABLET | Freq: Three times a day (TID) | ORAL | Status: DC | PRN

## 2024-04-28 MED ORDER — DICYCLOMINE HCL 20 MG PO TABS: 20.0000 mg | ORAL_TABLET | Freq: Four times a day (QID) | ORAL | Status: DC | PRN

## 2024-04-28 MED ORDER — QUETIAPINE FUMARATE 50 MG PO TABS
50.0000 mg | ORAL_TABLET | Freq: Every day | ORAL | Status: DC
Start: 2024-04-28 — End: 2024-04-28

## 2024-04-28 MED ORDER — CLONIDINE HCL 0.1 MG PO TABS: 0.1000 mg | ORAL_TABLET | Freq: Every day | ORAL | Status: DC

## 2024-04-28 MED ORDER — CLONIDINE HCL 0.1 MG PO TABS
0.1000 mg | ORAL_TABLET | Freq: Four times a day (QID) | ORAL | Status: DC
  Administered 2024-04-28 (×2): 0.1 mg via ORAL
  Filled 2024-04-28 (×2): qty 1

## 2024-04-28 MED ORDER — LOPERAMIDE HCL 2 MG PO CAPS: 2.0000 mg | ORAL_CAPSULE | ORAL | Status: DC | PRN

## 2024-04-28 NOTE — ED Notes (Signed)
 Patient brought back to TCU. Pt wanded by security. Sitter at bedside

## 2024-04-28 NOTE — Consult Note (Signed)
 [facility] Psychiatric Consult Initial  Patient Name: .Mark Hammond  MRN: [id number]  DOB: [birth year]  Consult Order details:  Orders (From admission, onward)     Start     Ordered   04/27/24 1606  CONSULT TO CALL ACT TEAM       Ordering Provider: Doretha Folks, MD  Provider:  (Not yet assigned)  Question:  Reason for Consult?  Answer:  Psych consult   04/27/24 1606             Mode of Visit: Tele-visit Virtual Statement:TELE PSYCHIATRY ATTESTATION & CONSENT As the provider for this telehealth consult, I attest that I verified the patient's identity using two separate identifiers, introduced myself to the patient, provided my credentials, disclosed my location, and performed this encounter via a HIPAA-compliant, real-time, face-to-face, two-way, interactive audio and video platform and with the full consent and agreement of the patient (or guardian as applicable.) Patient physical location: [facility]. Telehealth provider physical location: home office in state of [name].   Video start time: 1100 Video end time: 1145    Psychiatry Consult Evaluation  Service Date: April 28, 2024 LOS:  LOS: 0 days  Chief Complaint I just don't like living like this.  Primary Psychiatric Diagnoses  Schizoaffective Disorder 2.  Polysubstance Use Disorder 3.  Suicidal Ideation 4. Homicidal Ideation  Assessment  Mark Hammond is a 33 y.o. male admitted: voluntarily Presented to the ED Per ED Provider Admission Note dated 04/27/2024@1513 :    Patient is a 33 year old male with a history of schizophrenia, polysubstance abuse, alcohol abuse, intermittent heroin use with left hip replacement after septic joint from drug abuse who is presenting today with complaint of ongoing pain in his left hip since the surgery but reports that he is having homicidal and suicidal ideations, outburst of anger.  He has been drinking and using drugs over the last 3 days just to help cope with the pain he has in his hip.   He has not been taking any of his psychiatric medicines in the last day since he has been using.  He has no specific plan of who he would hurt or how he would hurt them.  He has not had anything to eat for about a day reports he has not been sleeping.  He has not had any fever, redness or drainage from his surgical site.  Last used heroin at about 11:00 this morning.  His current presentation of depressed mood, paranoia, disorientation, suicidal and homicidal ideations is most consistent with schizoaffective disorder; on admission he endorses AVH which has since improved, suspect sx were d/t  or exacerbated by polysubstance use as his UDS was positive for opioids, cocaine  and thc.  He meets criteria for psychiatric admission  based on above.  Current outpatient psychotropic medications include abilify , quetiapine , and sertraline  and historically he has had a good response to these medications. He was non compliant with medications prior to admission as evidenced by patient reports.   On initial examination, patient is alert and oriented to self and location only; his appearance is disheveled, in hospital scrubs.  He is cooperative and willing in engage in interview. He endorses suicidal ideations and is unable/unwilling to contract for safety.  Given his hx of medication non-adherence, polysubstance usage, chronic pain and  negative social determinants of health (homelessness, limited social support, unemployment) he is a high risk for self harm for which I recommend referral for admission to restart psych medications and monitoring for mood stability.  He was recently d/c from [facility] one week prior, maybe they can review for possible readmission.   Please see plan below for detailed recommendations.    Diagnoses:  Active Hospital problems: Active Problems:   MDD (major depressive disorder)   Schizoaffective disorder (HCC)   Polysubstance dependence (HCC)   Polysubstance use disorder    Plan    ## Psychiatric Medication Recommendations:  Plan to restart home medications Aripiprazole  5mg  po daily for  Quetiapine  50mg  po at bedtime Sertraline  50mg  po daily  COWS, clonidine  protocol   ## Medical Decision Making Capacity: Not specifically addressed in this encounter  ## Further Work-up:  -- deferred to ED provider While pt on Qtc prolonging medications, please monitor & replete K+ to 4 and Mg2+ to 2 or TOC consult for substance abuse resources -- most recent EKG on 04/27/2024 had QtC of 459 -- Pertinent labwork reviewed earlier this admission includes: CMP,CBC, UDS,EKG   ## Disposition:-- We recommend inpatient psychiatric hospitalization when medically cleared. Patient is under voluntary admission status at this time; please IVC if attempts to leave hospital.  ## Behavioral / Environmental: - No specific recommendations at this time.    ## Safety and Observation Level:  - Based on my clinical evaluation, I estimate the patient to be at low risk of self harm in the current setting. - At this time, we recommend  routine. This decision is based on my review of the chart including patient's history and current presentation, interview of the patient, mental status examination, and consideration of suicide risk including evaluating suicidal ideation, plan, intent, suicidal or self-harm behaviors, risk factors, and protective factors. This judgment is based on our ability to directly address suicide risk, implement suicide prevention strategies, and develop a safety plan while the patient is in the clinical setting. Please contact our team if there is a concern that risk level has changed.  CSSR Risk Category:C-SSRS RISK CATEGORY: High Risk  Suicide Risk Assessment: Patient has following modifiable risk factors for suicide: untreated depression, social isolation, recklessness, medication noncompliance, triggering events, recent psychiatric hospitalization, and pain, medical illness (ie  new dx of cancer), which we are addressing by restarting home psych meds and referring for inpatient psych admission. Patient has following non-modifiable or demographic risk factors for suicide: male gender and psychiatric hospitalization Patient has the following protective factors against suicide: Access to outpatient mental health care  Thank you for this consult request. Recommendations have been communicated to the primary team.  We will sign off at this time.   Bernadette FORBES Barefoot, NP       History of Present Illness  Relevant Aspects of Hospital ED Course:  Admitted on 04/27/2024 for Per RN Triage note dated 04/27/2024@1518 : BIBA from [facility] for pain in left hip, radiating to his knee- recent hip replacement surgery. Pt also reports having SI/HI with aggressive outbursts the last 4 days. Last used heroin today.   Patient Report:  Patient has a hx for similar presentation, between [facility] [facility] and [name], he's been seen approximately 17 times within the past 3 months with several inpatient psychiatric admissions,most recent was [facility] in one week ago after 2 weeks admission.  Patient reports he was d/c with outpatient resources resources for follow up but states he been unable to do so.    He reports feeling better today, suicidal and homicidal ideations have improved but unable to contract for safety if discharged home today.  He does not reports  AVH today but still appears paranoid, feels he's being watched.  Suspect this is related to schizoaffective dx and polysubstance usage. He reports mounting psychosocial stressors and hip pain that's triggering his mood instability.  He admits his concerns are exacerbated by polysubstance abuse, most recently used 1/10 gram of heroine and about 5 beers prior to admission.  He reports he's doing okay but currently experiencing body aches, and fatigue, withdrawal symptoms.  He denies hx of complicated  withdrawals or alcoholic seizures withdrawals.  He does admit his withdrawals make hip pain worse.  He reports periods of sobriety when he is in the hospital or jail.  He states when he is home it's routine for him to drink alcohol, which triggers polysubstance use.  He denies pending court dates.  He reports he used to live with his girlfriend until 2 weeks ago when she asked him to leave d/t his drug usage.  He states she only used CBD and does not approve of his drug usage.   He reports taking abilify  5mg  po daily, sertraline  50mg  po daily and quetiapine  50mg  po at bedtime; last took meds 4 days ago, except for quetiapine  which he states was stolen, unknown timeframe.  He reports his combined medications usually improves his overall mood and AVH.  He does not explain reason for medication non-adherence, given his UDS positive for cocaine , opioids and thc appeas he's self medicating.  Given his hx of polysubstance use and current symptoms, he was started on COWs, clonidine  protocol; his EKG was WNL to restart his home medications.   He reports sleep and appetite are good; he's able to state what he had for breakfast.  In reviewing the plan for psychiatric admission, he is agreeable.  He reports he ambulates with a walker but can independently complete is ADLs.  He does states he needs a shower chair to bathe.     Psych ROS:  Depression: yes Anxiety:  yes Mania (lifetime and current): denies Psychosis: (lifetime and current): hx for AVH but currently denies  Collateral information:  Deferred as pt is good historian  Review of Systems  Constitutional: Negative.   HENT:  Positive for sore throat.   Eyes: Negative.   Respiratory: Negative.    Cardiovascular: Negative.   Gastrointestinal: Negative.   Genitourinary: Negative.   Musculoskeletal:  Positive for joint pain (lt hip pain, chronic follow hip replacement 02/25/2024) and myalgias (reports d/t drug withdrawals).  Neurological: Negative.    Endo/Heme/Allergies: Negative.   Psychiatric/Behavioral:  Positive for depression, substance abuse and suicidal ideas. The patient is nervous/anxious.      Psychiatric and Social History  Psychiatric History:  Information collected from patient and chart review  Prev Dx/Sx: MDD, Methamphetamine use disorder; alcohol use disorder, schizophrenia, insomnia, anxiety cannabis use disorder Current Psych Provider: unknown Home Meds (current): Abilify , quetiapine , and sertraline  Previous Med Trials: unknown Therapy: denies  Prior Psych Hospitalization: yes, many  Prior Self Harm: yes Prior Violence: denies  Family Psych History: unknown Family Hx suicide: unknown  Social History:  Developmental Hx: unknown Educational Hx: unknown Occupational Hx: unemployed Armed forces operational officer Hx: hx for incarceration but currently denies pending court dates or legal concerns Living Situation: currently homeless Spiritual Hx: unknown Access to weapons/lethal means: denies   Substance History Alcohol: yes, 5 beers (unknown size) prior to admission  Type of alcohol beer Last Drink prior to admission Number of drinks per day varies History of alcohol withdrawal seizures denies History of DT's denies Tobacco: yes Illicit drugs: heroine,  cocaine  and thc Prescription drug abuse: denies Rehab hx: yes  Exam Findings  Physical Exam:  Vital Signs:  Temp:  [98 F (36.7 C)-98.5 F (36.9 C)] 98.2 F (36.8 C) (07/27 1023) Pulse Rate:  [88-117] 90 (07/27 1023) Resp:  [16-18] 16 (07/27 1023) BP: (121-135)/(74-90) 121/74 (07/27 1023) SpO2:  [98 %-99 %] 98 % (07/27 1023) Blood pressure 121/74, pulse 90, temperature 98.2 F (36.8 C), temperature source Oral, resp. rate 16, SpO2 98%. There is no height or weight on file to calculate BMI.  Physical Exam Cardiovascular:     Rate and Rhythm: Normal rate.     Pulses: Normal pulses.  Musculoskeletal:        General: Normal range of motion.     Cervical back:  Normal range of motion.  Neurological:     Mental Status: He is alert. He is disoriented.  Psychiatric:        Attention and Perception: Attention normal.        Mood and Affect: Mood is anxious and depressed. Affect is blunt.        Speech: Speech normal.        Behavior: Behavior is cooperative.        Thought Content: Thought content is paranoid. Thought content includes homicidal and suicidal ideation. Thought content does not include homicidal or suicidal plan.        Cognition and Memory: Cognition normal.        Judgment: Judgment is impulsive and inappropriate.     Mental Status Exam: General Appearance: Disheveled and Guarded  Orientation:  Other:  oriented to person and place, unsure of date  Memory:  Immediate;   Fair Recent;   Fair Remote;   Fair  Concentration:  Concentration: Fair and Attention Span: Fair  Recall:  Fair  Attention  Fair  Eye Contact:  Fair  Speech:  Normal Rate  Language:  Good  Volume:  Normal  Mood: Depressed  Affect:  Congruent, Constricted, and Depressed  Thought Process:  Linear  Thought Content:  Illogical, Paranoid Ideation, and Rumination  Suicidal Thoughts:  Yes.  without intent/plan  Homicidal Thoughts:  Yes.  without intent/plan  Judgement:  Impaired  Insight:  Lacking  Psychomotor Activity:  Normal  Akathisia:  No  Fund of Knowledge:  Fair      Assets:  Communication Skills  Cognition:  WNL  ADL's:  Impaired  AIMS (if indicated):        Other History   These have been pulled in through the EMR, reviewed, and updated if appropriate.  Family History:  The patient's family history includes Stroke in an other family member.  Medical History: Past Medical History:  Diagnosis Date   Alcoholic gastritis    Bipolar 1 disorder (HCC)    Dental abscess    Depression    Drug abuse (HCC)    ETOH abuse    Hallucination    Hypertension    Schizo affective schizophrenia (HCC)    Substance abuse (HCC)     Surgical  History: Past Surgical History:  Procedure Laterality Date   TOTAL HIP ARTHROPLASTY Left 02/23/2024   Procedure: ARTHROPLASTY, HIP, TOTAL,POSTERIOR APPROACH;  Surgeon: Jerri Kay HERO, MD;  Location: [facility] OR;  Service: Orthopedics;  Laterality: Left;  LEFT POSTERIOR HIP RESECTION ARTHROPLASTY WITH ANTIBIOTIC SPACER     Medications:   Current Facility-Administered Medications:    ARIPiprazole  (ABILIFY ) tablet 5 mg, 5 mg, Oral, Daily, Brysin Towery E, NP, 5 mg at 04/28/24 1141  cloNIDine  (CATAPRES ) tablet 0.1 mg, 0.1 mg, Oral, QID, 0.1 mg at 04/28/24 1024 **FOLLOWED BY** [START ON 04/30/2024] cloNIDine  (CATAPRES ) tablet 0.1 mg, 0.1 mg, Oral, BH-qamhs **FOLLOWED BY** [START ON 05/02/2024] cloNIDine  (CATAPRES ) tablet 0.1 mg, 0.1 mg, Oral, QAC breakfast, Lamari Youngers E, NP   dicyclomine  (BENTYL ) tablet 20 mg, 20 mg, Oral, Q6H PRN, Taelor Waymire E, NP   hydrOXYzine  (ATARAX ) tablet 25 mg, 25 mg, Oral, Q6H PRN, Wonda Goodgame E, NP   loperamide  (IMODIUM ) capsule 2-4 mg, 2-4 mg, Oral, PRN, Regena Delucchi E, NP   methocarbamol  (ROBAXIN ) tablet 500 mg, 500 mg, Oral, Q8H PRN, Ismaeel Arvelo E, NP   naproxen  (NAPROSYN ) tablet 500 mg, 500 mg, Oral, BID PRN, Moishe Burton E, NP, 500 mg at 04/28/24 1032   nicotine  (NICODERM CQ  - dosed in mg/24 hours) patch 21 mg, 21 mg, Transdermal, Daily, Plunkett, Whitney, MD, 21 mg at 04/27/24 1710   ondansetron  (ZOFRAN -ODT) disintegrating tablet 4 mg, 4 mg, Oral, Q6H PRN, Moishe Burton E, NP, 4 mg at 04/28/24 1031   QUEtiapine  (SEROQUEL ) tablet 50 mg, 50 mg, Oral, QHS, Mikhi Athey E, NP   sertraline  (ZOLOFT ) tablet 50 mg, 50 mg, Oral, Daily, Imari Sivertsen E, NP, 50 mg at 04/28/24 1141  Current Outpatient Medications:    ARIPiprazole  (ABILIFY ) 5 MG tablet, Take 5 mg by mouth daily., Disp: , Rfl:    QUEtiapine  (SEROQUEL ) 50 MG tablet, Take 50 mg by mouth at bedtime., Disp: , Rfl:    sertraline  (ZOLOFT ) 50 MG tablet, Take 50 mg by mouth daily., Disp: , Rfl:    Allergies: Allergies  Allergen Reactions   Fish Allergy Other (See Comments)    Patient does not wish to eat ANY fish- reaction unclear   Shellfish Allergy Other (See Comments)    Patient does not wish to eat ANY shellfish- reaction unclear   Codeine Other (See Comments)    Unknown childhood reaction    Burton FORBES Moishe, NP

## 2024-04-28 NOTE — ED Notes (Signed)
 Report given to [place] at [facility]

## 2024-04-28 NOTE — ED Provider Notes (Signed)
 Emergency Medicine Observation Re-evaluation Note  Mark Hammond is a 33 y.o. male, seen on rounds today.  Pt initially presented to the ED for complaints of Suicidal and Hip Pain Currently, the patient is in room without complaint.  Physical Exam  BP (!) 135/90 (BP Location: Right Arm)   Pulse 88   Temp 98.5 F (36.9 C) (Oral)   Resp 18   SpO2 99%  Physical Exam General: Awake, alert Cardiac: Normal rate Lungs: Normal effort Psych: Mood appropriate  ED Course / MDM  EKG:EKG Interpretation Date/Time:  Saturday April 27 2024 16:47:28 EDT Ventricular Rate:  105 PR Interval:  136 QRS Duration:  86 QT Interval:  348 QTC Calculation: 459 R Axis:   68  Text Interpretation: Sinus tachycardia Otherwise normal ECG When compared with ECG of 09-Apr-2024 10:26, PREVIOUS ECG IS PRESENT Confirmed by Doretha Folks ([id number]) on 04/27/2024 4:59:25 PM  I have reviewed the labs performed to date as well as medications administered while in observation.  Recent changes in the last 24 hours include medical workup which was ultimately negative.  Initial telepsych evaluation of her recommending inpatient.  Patient without any infectious symptoms related to his right hip.  Unlikely to be a septic arthritis..  Plan  Current plan is for placement.    Mannie Pac T, DO 04/28/24 [phone]

## 2024-04-28 NOTE — ED Notes (Signed)
 [place], RN with [facility] called to accept patient.   Per [place], Accepting/Attending will be Dr. Debby Carrion. RN report to [address] [phone]. Pt can arrive at after 5:00pm today.

## 2024-04-28 NOTE — Progress Notes (Signed)
 Patient has been denied by [facility] due to no appropriate beds available. Patient meets [facility] inpatient criteria per Roxianne Olp, NP. Patient has been faxed out to the following facilities:   [facility]  [address] [place] [place] [address] [phone]  [facility]  [address] [place] [address] [phone]  [facility]  [address], [place] [address] [phone]  [facility]  [address] [place], [place] [address] [phone]  [facility] Patient Placement  [facility] [place] [phone]  [facility]  [address], [place] [address] [phone]  [facility]  [address] [place] [address] [phone]  [facility]  [address] [place], [place] [phone]  [facility]  [address] [place] [address] [phone]  [facility]  [address], [place] [address] [phone]  [facility]  [address] [place] [address] [phone]  [facility]  [address] [place] [address] [phone]  [facility]  [address] [place], [place] [address] [phone]  [facility]  [address], [place] [address] [phone]  [facility]  [address] [place] [address] [phone]  [facility]  [address], [place] [address] [phone]  [facility]  [address], [place] [address]  [phone]   Bunnie Gallop, MSW, LCSW-A  10:59 AM 04/28/2024

## 2024-05-09 ENCOUNTER — Emergency Department (HOSPITAL_COMMUNITY)
Admission: EM | Admit: 2024-05-09 | Discharge: 2024-05-09 | Disposition: A | Discharge status: Home / Self Care | Payer: MEDICAID | Attending: Emergency Medicine | Admitting: Emergency Medicine

## 2024-05-09 ENCOUNTER — Other Ambulatory Visit: Payer: Self-pay

## 2024-05-09 ENCOUNTER — Encounter (HOSPITAL_COMMUNITY): Payer: Self-pay

## 2024-05-09 ENCOUNTER — Other Ambulatory Visit (HOSPITAL_COMMUNITY): Payer: Self-pay

## 2024-05-09 DIAGNOSIS — F1193 Opioid use, unspecified with withdrawal: Secondary | ICD-10-CM

## 2024-05-09 DIAGNOSIS — F1113 Opioid abuse with withdrawal: Secondary | ICD-10-CM | POA: Diagnosis present

## 2024-05-09 DIAGNOSIS — I10 Essential (primary) hypertension: Secondary | ICD-10-CM | POA: Insufficient documentation

## 2024-05-09 DIAGNOSIS — F1910 Other psychoactive substance abuse, uncomplicated: Secondary | ICD-10-CM

## 2024-05-09 DIAGNOSIS — Z96642 Presence of left artificial hip joint: Secondary | ICD-10-CM | POA: Diagnosis not present

## 2024-05-09 LAB — CBG MONITORING, ED: Glucose-Capillary: 122 mg/dL — ABNORMAL HIGH (ref 70–99)

## 2024-05-09 MED ORDER — KETOROLAC TROMETHAMINE 15 MG/ML IJ SOLN
15.0000 mg | Freq: Once | INTRAMUSCULAR | Status: AC
  Administered 2024-05-09: 15 mg via INTRAVENOUS
  Filled 2024-05-09: qty 1

## 2024-05-09 MED ORDER — ONDANSETRON 4 MG PO TBDP
4.0000 mg | ORAL_TABLET | Freq: Three times a day (TID) | ORAL | 0 refills | Status: DC | PRN
  Filled 2024-05-09: qty 20, 7d supply, fill #0

## 2024-05-09 MED ORDER — ONDANSETRON HCL 4 MG/2ML IJ SOLN
4.0000 mg | Freq: Once | INTRAMUSCULAR | Status: AC
  Administered 2024-05-09: 4 mg via INTRAVENOUS
  Filled 2024-05-09: qty 2

## 2024-05-09 MED ORDER — LACTATED RINGERS IV BOLUS
1000.0000 mL | Freq: Once | INTRAVENOUS | Status: AC
  Administered 2024-05-09: 1000 mL via INTRAVENOUS

## 2024-05-09 NOTE — ED Triage Notes (Signed)
 Patient BIB EMS for evaluation of withdrawal symptoms. Patient reports emesis, headache, and extremity tingling for 2 hours. Patient reports use of heroin, cocaine , opioids, and alcohol. Patient A&Ox4.

## 2024-05-09 NOTE — Discharge Instructions (Addendum)
 You were seen today for withdrawal. While you were here we monitored your vitals, preformed a physical exam, and labs EKG. These were all reassuring and there is no indication for any further testing or intervention in the emergency department at this time.   Things to do:  - Follow up with your primary care provider within the next 1-2 weeks - I have added additional detox center for your consideration.  These do not require direct referral.  Please take this paperwork with you.  Return to the emergency department if you have any new or worsening symptoms including inability to eat or drink, confusion, hallucinations, vigorous shaking or sweating., or if you have any other concerns.  [facility] [address] [phone]  [facility] [address] [phone]  [facility] [address] [place] [phone]

## 2024-05-09 NOTE — ED Provider Notes (Signed)
 [place] [facility] Provider Note  MDM   HPI/ROS:  Mark Hammond is a 33 y.o. male with a medical history as below who presents with concern for heroin withdrawal.  Patient reports he has not used heroin in 2 to 3 days.  He does endorse using methamphetamine, cocaine , pain pills and alcohol yesterday.  He states he is feeling some nausea, shortness of breath, and diffuse pain that open worsening through the night.  Physical exam is notable for: - Mild tremulousness, tachycardia  On my initial evaluation, patient is:  -Vital signs stable with some mild tachycardia. Patient afebrile, hemodynamically stable, and non-toxic appearing. -Additional history obtained from chart review   Differentials include withdrawal, intoxication, infection, ACS, PE, dissection.    On physical exam patient is generally very well-appearing.  He is mildly tachycardic in the low 100s and mildly tremulous.  He does endorse hyperalgesia but has no tenderness to palpation anywhere.  He does endorse significant drug use yesterday and his symptoms may be a remnant effect however he does have a very significant drug use history and this may also include symptoms of acute withdrawal.  EKG, Zofran  and IV fluid resuscitation given.  On reassessment patient does feel improved however complaining of some itching.  Oral Benadryl  given.  On further reassessment his tachycardia has resolved and he feels much improved.  No believe he requires labs or imaging at this point.  Symptoms likely secondary to intoxication versus acute withdrawal.  He was given resources for detoxification and recovery as well as strict return precautions.  He was discharged in stable condition.   Interpretations, interventions, and the patient's course of care are documented below.    Clinical Course as of 05/09/24 0907  Thu May 09, 2024  0719 Glucose-Capillary(!): 122 [RC]  0726 EKG 12-Lead Rate 100, NSR, normal axis  and intervals.  Benign early repolarization.  Nonischemic [RC]  0907 Pulse Rate: 96 improved [RC]    Clinical Course User Index [RC] Mark Darina RAMAN, MD      Disposition:  I discussed the plan for discharge with the patient and/or their surrogate at bedside prior to discharge and they were in agreement with the plan and verbalized understanding of the return precautions provided. All questions answered to the best of my ability. Ultimately, the patient was discharged in stable condition with stable vital signs. I am reassured that they are capable of close follow up and good social support at home.   Clinical Impression:  1. Opioid withdrawal (HCC)   2. Polysubstance abuse (HCC)     Rx / DC Orders ED Discharge Orders          Ordered    ondansetron  (ZOFRAN -ODT) 4 MG disintegrating tablet  Every 8 hours PRN        05/09/24 0907            The plan for this patient was discussed with Dr. Freddi, who voiced agreement and who oversaw evaluation and treatment of this patient.   Clinical Complexity A medically appropriate history, review of systems, and physical exam was performed.  My independent interpretations of EKG, labs, and radiology are documented in the ED course above.   If decision rules were used in this patient's evaluation, they are listed below.   Click here for ABCD2, HEART and other calculatorsREFRESH Note before signing   Patient's presentation is most consistent with acute presentation with potential threat to life or bodily function.  Medical Decision Making  Amount and/or Complexity of Data Reviewed Labs:  Decision-making details documented in ED Course. ECG/medicine tests:  Decision-making details documented in ED Course.  Risk Prescription drug management.    HPI/ROS      See MDM section for pertinent HPI and ROS. A complete ROS was performed with pertinent positives/negatives noted above.   Past Medical History:  Diagnosis Date    Alcoholic gastritis    Bipolar 1 disorder (HCC)    Dental abscess    Depression    Drug abuse (HCC)    ETOH abuse    Hallucination    Hypertension    Schizo affective schizophrenia (HCC)    Substance abuse (HCC)     Past Surgical History:  Procedure Laterality Date   TOTAL HIP ARTHROPLASTY Left 02/23/2024   Procedure: ARTHROPLASTY, HIP, TOTAL,POSTERIOR APPROACH;  Surgeon: Jerri Kay HERO, MD;  Location: [facility] OR;  Service: Orthopedics;  Laterality: Left;  LEFT POSTERIOR HIP RESECTION ARTHROPLASTY WITH ANTIBIOTIC SPACER      Physical Exam   Vitals:   05/09/24 0800 05/09/24 0853 05/09/24 0856 05/09/24 0857  BP: 105/77     Pulse: (!) 108 99 96 96  Resp: 14 (!) 22 18 18   Temp:      TempSrc:      SpO2: 100% 100% 100% 100%  Weight:      Height:        Physical Exam Vitals and nursing note reviewed.  Constitutional:      General: He is not in acute distress.    Appearance: He is well-developed. He is not ill-appearing.  HENT:     Head: Normocephalic and atraumatic.  Eyes:     Conjunctiva/sclera: Conjunctivae normal.  Cardiovascular:     Rate and Rhythm: Regular rhythm. Tachycardia present.     Heart sounds: No murmur heard. Pulmonary:     Effort: Pulmonary effort is normal. No respiratory distress.     Breath sounds: Normal breath sounds.  Abdominal:     Palpations: Abdomen is soft.     Tenderness: There is no abdominal tenderness.  Musculoskeletal:        General: No swelling.     Cervical back: Neck supple.  Skin:    General: Skin is warm and dry.     Capillary Refill: Capillary refill takes less than 2 seconds.  Neurological:     Mental Status: He is alert.     Comments: Mildly tremulous  Psychiatric:        Mood and Affect: Mood normal.      Procedures   If procedures were preformed on this patient, they are listed below:  Procedures   @BBSIG @   Please note that this documentation was produced with the assistance of voice-to-text technology and may  contain errors.    Mark Darina RAMAN, MD 05/09/24 9091    Freddi Hamilton, MD 05/09/24 1004

## 2024-05-10 ENCOUNTER — Other Ambulatory Visit (HOSPITAL_COMMUNITY): Payer: Self-pay

## 2024-05-10 ENCOUNTER — Telehealth (HOSPITAL_COMMUNITY): Payer: Self-pay | Admitting: Licensed Clinical Social Worker

## 2024-05-10 ENCOUNTER — Other Ambulatory Visit (HOSPITAL_COMMUNITY)
Admission: EM | Admit: 2024-05-10 | Discharge: 2024-05-12 | Discharge status: Against Medical Advice | Payer: MEDICAID | Attending: Family Medicine | Admitting: Family Medicine

## 2024-05-10 ENCOUNTER — Ambulatory Visit (HOSPITAL_COMMUNITY)
Admission: EM | Admit: 2024-05-10 | Discharge: 2024-05-10 | Disposition: A | Discharge status: Home / Self Care | Payer: MEDICAID | Attending: Psychiatry | Admitting: Psychiatry

## 2024-05-10 DIAGNOSIS — F603 Borderline personality disorder: Secondary | ICD-10-CM | POA: Insufficient documentation

## 2024-05-10 DIAGNOSIS — F17210 Nicotine dependence, cigarettes, uncomplicated: Secondary | ICD-10-CM | POA: Insufficient documentation

## 2024-05-10 DIAGNOSIS — F10230 Alcohol dependence with withdrawal, uncomplicated: Secondary | ICD-10-CM | POA: Insufficient documentation

## 2024-05-10 DIAGNOSIS — F1410 Cocaine abuse, uncomplicated: Secondary | ICD-10-CM | POA: Insufficient documentation

## 2024-05-10 DIAGNOSIS — F1910 Other psychoactive substance abuse, uncomplicated: Secondary | ICD-10-CM

## 2024-05-10 DIAGNOSIS — Z9151 Personal history of suicidal behavior: Secondary | ICD-10-CM | POA: Insufficient documentation

## 2024-05-10 DIAGNOSIS — F1510 Other stimulant abuse, uncomplicated: Secondary | ICD-10-CM | POA: Diagnosis not present

## 2024-05-10 DIAGNOSIS — F1010 Alcohol abuse, uncomplicated: Secondary | ICD-10-CM | POA: Insufficient documentation

## 2024-05-10 DIAGNOSIS — F1120 Opioid dependence, uncomplicated: Secondary | ICD-10-CM | POA: Insufficient documentation

## 2024-05-10 DIAGNOSIS — F209 Schizophrenia, unspecified: Secondary | ICD-10-CM | POA: Insufficient documentation

## 2024-05-10 DIAGNOSIS — F259 Schizoaffective disorder, unspecified: Secondary | ICD-10-CM | POA: Insufficient documentation

## 2024-05-10 DIAGNOSIS — T50916A Underdosing of multiple unspecified drugs, medicaments and biological substances, initial encounter: Secondary | ICD-10-CM | POA: Insufficient documentation

## 2024-05-10 DIAGNOSIS — F1113 Opioid abuse with withdrawal: Secondary | ICD-10-CM | POA: Insufficient documentation

## 2024-05-10 DIAGNOSIS — F32A Depression, unspecified: Secondary | ICD-10-CM | POA: Insufficient documentation

## 2024-05-10 DIAGNOSIS — Z91128 Patient's intentional underdosing of medication regimen for other reason: Secondary | ICD-10-CM | POA: Insufficient documentation

## 2024-05-10 DIAGNOSIS — Z9152 Personal history of nonsuicidal self-harm: Secondary | ICD-10-CM | POA: Insufficient documentation

## 2024-05-10 DIAGNOSIS — F250 Schizoaffective disorder, bipolar type: Secondary | ICD-10-CM | POA: Diagnosis present

## 2024-05-10 LAB — COMPREHENSIVE METABOLIC PANEL WITH GFR
ALT: 80 U/L — ABNORMAL HIGH (ref 0–44)
AST: 87 U/L — ABNORMAL HIGH (ref 15–41)
Albumin: 3.5 g/dL (ref 3.5–5.0)
Alkaline Phosphatase: 121 U/L (ref 38–126)
Anion gap: 13 (ref 5–15)
BUN: <5 mg/dL — ABNORMAL LOW (ref 6–20)
CO2: 24 mmol/L (ref 22–32)
Calcium: 8.8 mg/dL — ABNORMAL LOW (ref 8.9–10.3)
Chloride: 101 mmol/L (ref 98–111)
Creatinine, Ser: 0.57 mg/dL — ABNORMAL LOW (ref 0.61–1.24)
GFR, Estimated: >60 mL/min (ref >60)
Glucose, Bld: 83 mg/dL (ref 70–99)
Potassium: 3.9 mmol/L (ref 3.5–5.1)
Sodium: 138 mmol/L (ref 135–145)
Total Bilirubin: 0.4 mg/dL (ref 0.0–1.2)
Total Protein: 7.7 g/dL (ref 6.5–8.1)

## 2024-05-10 LAB — CBC WITH DIFFERENTIAL/PLATELET
Abs Immature Granulocytes: 0.03 K/uL (ref 0.00–0.07)
Basophils Absolute: 0.1 K/uL (ref 0.0–0.1)
Basophils Relative: 1 %
Eosinophils Absolute: 0.1 K/uL (ref 0.0–0.5)
Eosinophils Relative: 1 %
HCT: 33.7 % — ABNORMAL LOW (ref 39.0–52.0)
Hemoglobin: 10.9 g/dL — ABNORMAL LOW (ref 13.0–17.0)
Immature Granulocytes: 0 %
Lymphocytes Relative: 37 %
Lymphs Abs: 3.1 K/uL (ref 0.7–4.0)
MCH: 26.3 pg (ref 26.0–34.0)
MCHC: 32.3 g/dL (ref 30.0–36.0)
MCV: 81.4 fL (ref 80.0–100.0)
Monocytes Absolute: 0.8 K/uL (ref 0.1–1.0)
Monocytes Relative: 10 %
Neutro Abs: 4.2 K/uL (ref 1.7–7.7)
Neutrophils Relative %: 51 %
Platelets: 302 K/uL (ref 150–400)
RBC: 4.14 MIL/uL — ABNORMAL LOW (ref 4.22–5.81)
RDW: 14.1 % (ref 11.5–15.5)
WBC: 8.3 K/uL (ref 4.0–10.5)
nRBC: 0 % (ref 0.0–0.2)

## 2024-05-10 LAB — LIPID PANEL
Cholesterol: 125 mg/dL (ref 0–200)
HDL: 48 mg/dL (ref >40)
LDL Cholesterol: 67 mg/dL (ref 0–99)
Total CHOL/HDL Ratio: 2.6 ratio
Triglycerides: 49 mg/dL (ref <150)
VLDL: 10 mg/dL (ref 0–40)

## 2024-05-10 LAB — POCT URINE DRUG SCREEN - MANUAL ENTRY (I-SCREEN)
POC Amphetamine UR: NOT DETECTED
POC Buprenorphine (BUP): NOT DETECTED
POC Cocaine UR: POSITIVE — AB
POC Marijuana UR: NOT DETECTED
POC Methadone UR: NOT DETECTED
POC Methamphetamine UR: POSITIVE — AB
POC Morphine: NOT DETECTED
POC Oxazepam (BZO): NOT DETECTED
POC Oxycodone UR: NOT DETECTED
POC Secobarbital (BAR): NOT DETECTED

## 2024-05-10 LAB — ETHANOL: Alcohol, Ethyl (B): 126 mg/dL — ABNORMAL HIGH (ref <15)

## 2024-05-10 MED ORDER — ALUM & MAG HYDROXIDE-SIMETH 200-200-20 MG/5ML PO SUSP: 30.0000 mL | ORAL | Status: DC | PRN

## 2024-05-10 MED ORDER — DIPHENHYDRAMINE HCL 50 MG/ML IJ SOLN: 50.0000 mg | Freq: Three times a day (TID) | INTRAMUSCULAR | Status: DC | PRN

## 2024-05-10 MED ORDER — DIPHENHYDRAMINE HCL 50 MG PO CAPS: 50.0000 mg | ORAL_CAPSULE | Freq: Three times a day (TID) | ORAL | Status: DC | PRN

## 2024-05-10 MED ORDER — HALOPERIDOL LACTATE 5 MG/ML IJ SOLN: 5.0000 mg | Freq: Three times a day (TID) | INTRAMUSCULAR | Status: DC | PRN

## 2024-05-10 MED ORDER — DICYCLOMINE HCL 20 MG PO TABS: 20.0000 mg | ORAL_TABLET | Freq: Four times a day (QID) | ORAL | Status: DC | PRN

## 2024-05-10 MED ORDER — LOPERAMIDE HCL 2 MG PO CAPS: 2.0000 mg | ORAL_CAPSULE | ORAL | Status: DC | PRN

## 2024-05-10 MED ORDER — QUETIAPINE FUMARATE 50 MG PO TABS
50.0000 mg | ORAL_TABLET | Freq: Every day | ORAL | Status: DC
  Administered 2024-05-10 – 2024-05-11 (×2): 50 mg via ORAL
  Filled 2024-05-10 (×2): qty 1

## 2024-05-10 MED ORDER — CLONIDINE HCL 0.1 MG PO TABS: 0.1000 mg | ORAL_TABLET | Freq: Every day | ORAL | Status: DC

## 2024-05-10 MED ORDER — ARIPIPRAZOLE 5 MG PO TABS
5.0000 mg | ORAL_TABLET | Freq: Every day | ORAL | Status: DC
  Administered 2024-05-11 – 2024-05-12 (×2): 5 mg via ORAL
  Filled 2024-05-10 (×2): qty 1

## 2024-05-10 MED ORDER — LORAZEPAM 2 MG/ML IJ SOLN: 2.0000 mg | Freq: Three times a day (TID) | INTRAMUSCULAR | Status: DC | PRN

## 2024-05-10 MED ORDER — NICOTINE 21 MG/24HR TD PT24
21.0000 mg | MEDICATED_PATCH | Freq: Every day | TRANSDERMAL | Status: DC
  Administered 2024-05-11 – 2024-05-12 (×2): 21 mg via TRANSDERMAL
  Filled 2024-05-10 (×2): qty 1

## 2024-05-10 MED ORDER — METHOCARBAMOL 500 MG PO TABS: 500.0000 mg | ORAL_TABLET | Freq: Three times a day (TID) | ORAL | Status: DC | PRN

## 2024-05-10 MED ORDER — HALOPERIDOL 5 MG PO TABS: 5.0000 mg | ORAL_TABLET | Freq: Three times a day (TID) | ORAL | Status: DC | PRN

## 2024-05-10 MED ORDER — NAPROXEN 500 MG PO TABS: 500.0000 mg | ORAL_TABLET | Freq: Two times a day (BID) | ORAL | Status: DC | PRN

## 2024-05-10 MED ORDER — HALOPERIDOL LACTATE 5 MG/ML IJ SOLN: 10.0000 mg | Freq: Three times a day (TID) | INTRAMUSCULAR | Status: DC | PRN

## 2024-05-10 MED ORDER — CHLORDIAZEPOXIDE HCL 25 MG PO CAPS: 25.0000 mg | ORAL_CAPSULE | Freq: Four times a day (QID) | ORAL | Status: DC | PRN

## 2024-05-10 MED ORDER — THIAMINE HCL 100 MG/ML IJ SOLN: 100.0000 mg | Freq: Once | INTRAMUSCULAR | Status: DC

## 2024-05-10 MED ORDER — CLONIDINE HCL 0.1 MG PO TABS
0.1000 mg | ORAL_TABLET | Freq: Four times a day (QID) | ORAL | Status: DC
  Administered 2024-05-10 – 2024-05-12 (×6): 0.1 mg via ORAL
  Filled 2024-05-10 (×7): qty 1

## 2024-05-10 MED ORDER — CHLORDIAZEPOXIDE HCL 25 MG PO CAPS: 25.0000 mg | ORAL_CAPSULE | Freq: Every day | ORAL | Status: DC

## 2024-05-10 MED ORDER — ADULT MULTIVITAMIN W/MINERALS CH
1.0000 | ORAL_TABLET | Freq: Every day | ORAL | Status: DC
  Administered 2024-05-10 – 2024-05-11 (×2): 1 via ORAL
  Filled 2024-05-10 (×2): qty 1

## 2024-05-10 MED ORDER — HYDROXYZINE HCL 25 MG PO TABS
25.0000 mg | ORAL_TABLET | Freq: Four times a day (QID) | ORAL | Status: DC | PRN
  Administered 2024-05-11: 25 mg via ORAL
  Filled 2024-05-10: qty 1

## 2024-05-10 MED ORDER — ACETAMINOPHEN 325 MG PO TABS
650.0000 mg | ORAL_TABLET | Freq: Four times a day (QID) | ORAL | Status: DC | PRN
  Filled 2024-05-10: qty 2

## 2024-05-10 MED ORDER — MAGNESIUM HYDROXIDE 400 MG/5ML PO SUSP: 30.0000 mL | Freq: Every day | ORAL | Status: DC | PRN

## 2024-05-10 MED ORDER — ACETAMINOPHEN 325 MG PO TABS
650.0000 mg | ORAL_TABLET | Freq: Four times a day (QID) | ORAL | Status: DC | PRN
  Administered 2024-05-10 – 2024-05-12 (×3): 650 mg via ORAL
  Filled 2024-05-10: qty 2

## 2024-05-10 MED ORDER — CHLORDIAZEPOXIDE HCL 25 MG PO CAPS
25.0000 mg | ORAL_CAPSULE | Freq: Three times a day (TID) | ORAL | Status: DC
  Administered 2024-05-12: 25 mg via ORAL
  Filled 2024-05-10 (×2): qty 1

## 2024-05-10 MED ORDER — ACETAMINOPHEN 325 MG PO TABS: 650.0000 mg | ORAL_TABLET | Freq: Four times a day (QID) | ORAL | Status: DC | PRN

## 2024-05-10 MED ORDER — CHLORDIAZEPOXIDE HCL 25 MG PO CAPS
25.0000 mg | ORAL_CAPSULE | Freq: Four times a day (QID) | ORAL | Status: AC
  Administered 2024-05-10 – 2024-05-11 (×5): 25 mg via ORAL
  Filled 2024-05-10 (×4): qty 1

## 2024-05-10 MED ORDER — ALUM & MAG HYDROXIDE-SIMETH 200-200-20 MG/5ML PO SUSP
30.0000 mL | ORAL | Status: DC | PRN
Start: 2024-05-10 — End: 2024-05-12

## 2024-05-10 MED ORDER — ADULT MULTIVITAMIN W/MINERALS CH
1.0000 | ORAL_TABLET | Freq: Every day | ORAL | Status: DC
  Administered 2024-05-11 – 2024-05-12 (×2): 1 via ORAL
  Filled 2024-05-10 (×2): qty 1

## 2024-05-10 MED ORDER — LORAZEPAM 1 MG PO TABS: 1.0000 mg | ORAL_TABLET | ORAL | Status: DC | PRN

## 2024-05-10 MED ORDER — SERTRALINE HCL 25 MG PO TABS
25.0000 mg | ORAL_TABLET | Freq: Once | ORAL | Status: AC
  Administered 2024-05-11: 25 mg via ORAL
  Filled 2024-05-10: qty 1

## 2024-05-10 MED ORDER — LORAZEPAM 2 MG/ML PO CONC
2.0000 mg | ORAL | Status: DC | PRN
Start: 2024-05-10 — End: 2024-05-10

## 2024-05-10 MED ORDER — OLANZAPINE 5 MG PO TBDP: 5.0000 mg | ORAL_TABLET | Freq: Three times a day (TID) | ORAL | Status: DC | PRN

## 2024-05-10 MED ORDER — CLONIDINE HCL 0.1 MG PO TABS: 0.1000 mg | ORAL_TABLET | ORAL | Status: DC

## 2024-05-10 MED ORDER — ONDANSETRON 4 MG PO TBDP: 4.0000 mg | ORAL_TABLET | Freq: Four times a day (QID) | ORAL | Status: DC | PRN

## 2024-05-10 MED ORDER — CHLORDIAZEPOXIDE HCL 25 MG PO CAPS: 25.0000 mg | ORAL_CAPSULE | ORAL | Status: DC
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
## 2024-05-10 NOTE — ED Provider Notes (Signed)
 Facility Based Crisis Admission H&P  Date: 05/10/24 Patient Name: Mark Hammond MRN: [id number] Chief Complaint: I need to be sober.  Diagnoses:  Final diagnoses:  Heroin dependence (HCC)  Alcohol abuse  Schizoaffective disorder, unspecified type (HCC)    HPI: Pt was discharged 3 days ago from an inpatient psych unit. Delight He says he has Bipolar disorder and Schizophrenia. He hears voices every day and it is worse when he is under the influence. His longest period of sobriety was when he was in jail for 6 months. He used 1/10th of a gram of heroine yesterday and 4 40 oz and a 5th of alcohol. He reports withdrawal symptoms of pain, headache, nausea, shakes, and diarrhea.  He has overdosed on Fentanyl  8 times this year. One of those was intentional and the others were accidental overdoses. He became unable to work in Jan of this year due to hip surgery and cellulitis. He has applied for disability. Girlfriend of 3 days and he are living in a hotel room.   He has been psychiatrically hospitalized 4-6 times and was first diagnosed Bipolar at age 63. He started using substances at age [age]. His mother was murdered when he was 33 years old. He says he has never grieved this loss. He did not finish HS.  PHQ 2-9:  Flowsheet Row ED from 01/14/2024 in [facility] ED from 12/08/2023 in [facility] ED from 11/21/2023 in [facility]  Thoughts that you would be better off dead, or of hurting yourself in some way Several days Not at all Not at all  PHQ-9 Total Score 14 0 8    Flowsheet Row ED from 05/10/2024 in [facility] Most recent reading at 05/10/2024 10:06 AM ED from 05/10/2024 in [facility] Most recent reading at 05/10/2024  7:38 AM ED from 05/09/2024 in [facility] Emergency Department at [facility] Most recent reading at 05/09/2024  7:24 AM  C-SSRS RISK  CATEGORY Moderate Risk Moderate Risk Error: Q3, 4, or 5 should not be populated when Q2 is No      Total Time spent with patient: 45 minutes  Musculoskeletal  Strength & Muscle Tone: not tested Gait & Station: uses walker,  Patient leans: left  Psychiatric Specialty Exam  Presentation General Appearance:  Appropriate for Environment  Eye Contact: Good  Speech: Clear and Coherent  Speech Volume: Normal  Handedness: Right   Mood and Affect  Mood: Depressed  Affect: Congruent; Depressed   Thought Process  Thought Processes: Linear; Goal Directed  Descriptions of Associations:Intact  Orientation:Full (Time, Place and Person)  Thought Content:Logical  Diagnosis of Schizophrenia or Schizoaffective disorder in past: Yes  Duration of Psychotic Symptoms: Greater than six months  Hallucinations:Hallucinations: Auditory Description of Auditory Hallucinations: daily voices reduce when he takes medication and increase when he uses substances, multiple voices, neg and positive talk about and to him  Ideas of Reference:None  Suicidal Thoughts:Suicidal Thoughts: Yes, Passive SI Passive Intent and/or Plan: Without Intent  Homicidal Thoughts:Homicidal Thoughts: No   Sensorium  Memory: Immediate Good; Recent Good; Remote Good  Judgment: Fair  Insight: Good   Executive Functions  Concentration: Good  Attention Span: Good  Recall: Good  Fund of Knowledge: Good  Language: Good   Psychomotor Activity  Psychomotor Activity: Psychomotor Activity: Normal   Assets  Assets: Communication Skills; Desire for Improvement; Resilience   Sleep  Sleep: Sleep: Fair Number of Hours  of Sleep: 6   Nutritional Assessment (For OBS and FBC admissions only) Has the patient had a weight loss or gain of 10 pounds or more in the last 3 months?: Yes Has the patient had a decrease in food intake/or appetite?: Yes Does the patient have dental problems?:  No Does the patient have eating habits or behaviors that may be indicators of an eating disorder including binging or inducing vomiting?: No Has the patient recently lost weight without trying?: 2 Has the patient been eating poorly because of a decreased appetite?: 1 Malnutrition Screening Tool Score: 3    Physical Exam Constitutional:      Appearance: Normal appearance.  HENT:     Head: Normocephalic and atraumatic.     Nose: Nose normal.     Mouth/Throat:     Mouth: Mucous membranes are moist.  Eyes:     Pupils: Pupils are equal, round, and reactive to light.  Pulmonary:     Effort: Pulmonary effort is normal.  Musculoskeletal:     Cervical back: Normal range of motion.  Neurological:     Mental Status: He is alert.     Gait: Gait abnormal.    Review of Systems  Constitutional:  Positive for malaise/fatigue. Negative for chills and fever.  Respiratory: Negative.    Cardiovascular:  Positive for chest pain.  Gastrointestinal:  Positive for diarrhea and nausea.  Musculoskeletal:  Positive for back pain, joint pain and myalgias.  Neurological:  Positive for headaches.  Psychiatric/Behavioral:  Positive for depression.     Blood pressure 132/84, pulse (!) 102, temperature 98.5 F (36.9 C), temperature source Oral, resp. rate 18, SpO2 100%. There is no height or weight on file to calculate BMI.  Past Psychiatric History: He has been psychiatrically hospitalized 4-6 times and was first diagnosed Bipolar at age 57. He started using substances at age [age]. His mother was murdered when he was 66 years old. He says he has never grieved this loss. He did not finish HS. 1 suicide attempt. Fentanyl  OD went to ICU  Is the patient at risk to self? Yes  Has the patient been a risk to self in the past 6 months? Yes .    Has the patient been a risk to self within the distant past? Yes   Is the patient a risk to others? No   Has the patient been a risk to others in the past 6 months? No    Has the patient been a risk to others within the distant past? No   Past Medical History: Pt reports he had hip surgery due to cellulitis,  Alcohol gastritis, Dental abscess, HTN Family History: Aunt mother and grandmother depression. Mother deceased-murdered Social History: His mother was murdered when he was 64 years old. He says he has never grieved this loss. He did not finish HS. He was expelled in 10th grade due to drawing graffiti in the bathroom. 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 Single, never married, no children.  Uncle, Aunt and GM has connection with.   Last Labs:  Admission on 05/10/2024, Discharged on 05/10/2024  Component Date Value Ref Range Status   WBC 05/10/2024 8.3  4.0 - 10.5 K/uL Final   RBC 05/10/2024 4.14 (L)  4.22 - 5.81 MIL/uL Final   Hemoglobin 05/10/2024 10.9 (L)  13.0 - 17.0 g/dL Final   HCT 91/91/7974 33.7 (L)  39.0 - 52.0 % Final   MCV 05/10/2024 81.4  80.0 - 100.0 fL Final  MCH 05/10/2024 26.3  26.0 - 34.0 pg Final   MCHC 05/10/2024 32.3  30.0 - 36.0 g/dL Final   RDW 91/91/7974 14.1  11.5 - 15.5 % Final   Platelets 05/10/2024 302  150 - 400 K/uL Final   nRBC 05/10/2024 0.0  0.0 - 0.2 % Final   Neutrophils Relative % 05/10/2024 51  % Final   Neutro Abs 05/10/2024 4.2  1.7 - 7.7 K/uL Final   Lymphocytes Relative 05/10/2024 37  % Final   Lymphs Abs 05/10/2024 3.1  0.7 - 4.0 K/uL Final   Monocytes Relative 05/10/2024 10  % Final   Monocytes Absolute 05/10/2024 0.8  0.1 - 1.0 K/uL Final   Eosinophils Relative 05/10/2024 1  % Final   Eosinophils Absolute 05/10/2024 0.1  0.0 - 0.5 K/uL Final   Basophils Relative 05/10/2024 1  % Final   Basophils Absolute 05/10/2024 0.1  0.0 - 0.1 K/uL Final   Immature Granulocytes 05/10/2024 0  % Final   Abs Immature Granulocytes 05/10/2024 0.03  0.00 - 0.07 K/uL Final   Performed at [facility], [address] [place], [place] [address]   Sodium 05/10/2024 138  135 - 145 mmol/L Final   Potassium  05/10/2024 3.9  3.5 - 5.1 mmol/L Final   Chloride 05/10/2024 101  98 - 111 mmol/L Final   CO2 05/10/2024 24  22 - 32 mmol/L Final   Glucose, Bld 05/10/2024 83  70 - 99 mg/dL Final   Glucose reference range applies only to samples taken after fasting for at least 8 hours.   BUN 05/10/2024 <5 (L)  6 - 20 mg/dL Final   Creatinine, Ser 05/10/2024 0.57 (L)  0.61 - 1.24 mg/dL Final   Calcium 91/91/7974 8.8 (L)  8.9 - 10.3 mg/dL Final   Total Protein 91/91/7974 7.7  6.5 - 8.1 g/dL Final   Albumin  05/10/2024 3.5  3.5 - 5.0 g/dL Final   AST 91/91/7974 87 (H)  15 - 41 U/L Final   ALT 05/10/2024 80 (H)  0 - 44 U/L Final   Alkaline Phosphatase 05/10/2024 121  38 - 126 U/L Final   Total Bilirubin 05/10/2024 0.4  0.0 - 1.2 mg/dL Final   GFR, Estimated 05/10/2024 >60  >60 mL/min Final   Comment: (NOTE) Calculated using the CKD-EPI Creatinine Equation (2021)    Anion gap 05/10/2024 13  5 - 15 Final   Performed at [facility], [address] [place], [place] [address]   Alcohol, Ethyl (B) 05/10/2024 126 (H)  <15 mg/dL Final   Comment: (NOTE) For medical purposes only. Performed at [facility], [address], [place], [place] [address]    Cholesterol 05/10/2024 125  0 - 200 mg/dL Final   Triglycerides 91/91/7974 49  <150 mg/dL Final   HDL 91/91/7974 48  >40 mg/dL Final   Total CHOL/HDL Ratio 05/10/2024 2.6  RATIO Final   VLDL 05/10/2024 10  0 - 40 mg/dL Final   LDL Cholesterol 05/10/2024 67  0 - 99 mg/dL Final   Comment:        Total Cholesterol/HDL:CHD Risk Coronary Heart Disease Risk Table                     Men   Women  1/2 Average Risk   3.4   3.3  Average Risk       5.0   4.4  2 X Average Risk   9.6   7.1  3  X Average Risk  23.4   11.0        Use the calculated Patient Ratio above and the CHD Risk Table to determine the patient's CHD Risk.        ATP III CLASSIFICATION (LDL):  <100     mg/dL   Optimal  899-870  mg/dL   Near or Above                    Optimal   130-159  mg/dL   Borderline  839-810  mg/dL   High  >809     mg/dL   Very High Performed at [facility], [address] [place], [place] [address]    POC Amphetamine UR 05/10/2024 None Detected  NONE DETECTED (Cut Off Level 1000 ng/mL) Final   POC Secobarbital (BAR) 05/10/2024 None Detected  NONE DETECTED (Cut Off Level 300 ng/mL) Final   POC Buprenorphine (BUP) 05/10/2024 None Detected  NONE DETECTED (Cut Off Level 10 ng/mL) Final   POC Oxazepam (BZO) 05/10/2024 None Detected  NONE DETECTED (Cut Off Level 300 ng/mL) Final   POC Cocaine  UR 05/10/2024 Positive (A)  NONE DETECTED (Cut Off Level 300 ng/mL) Final   POC Methamphetamine UR 05/10/2024 Positive (A)  NONE DETECTED (Cut Off Level 1000 ng/mL) Final   POC Morphine  05/10/2024 None Detected  NONE DETECTED (Cut Off Level 300 ng/mL) Final   POC Methadone UR 05/10/2024 None Detected  NONE DETECTED (Cut Off Level 300 ng/mL) Final   POC Oxycodone  UR 05/10/2024 None Detected  NONE DETECTED (Cut Off Level 100 ng/mL) Final   POC Marijuana UR 05/10/2024 None Detected  NONE DETECTED (Cut Off Level 50 ng/mL) Final  Admission on 05/09/2024, Discharged on 05/09/2024  Component Date Value Ref Range Status   Glucose-Capillary 05/09/2024 122 (H)  70 - 99 mg/dL Final   Glucose reference range applies only to samples taken after fasting for at least 8 hours.  Admission on 04/27/2024, Discharged on 04/28/2024  Component Date Value Ref Range Status   WBC 04/27/2024 8.4  4.0 - 10.5 K/uL Final   RBC 04/27/2024 3.91 (L)  4.22 - 5.81 MIL/uL Final   Hemoglobin 04/27/2024 10.3 (L)  13.0 - 17.0 g/dL Final   HCT 92/73/7974 32.3 (L)  39.0 - 52.0 % Final   MCV 04/27/2024 82.6  80.0 - 100.0 fL Final   MCH 04/27/2024 26.3  26.0 - 34.0 pg Final   MCHC 04/27/2024 31.9  30.0 - 36.0 g/dL Final   RDW 92/73/7974 13.5  11.5 - 15.5 % Final   Platelets 04/27/2024 299  150 - 400 K/uL Final   nRBC 04/27/2024 0.0  0.0 - 0.2 % Final   Neutrophils Relative % 04/27/2024  63  % Final   Neutro Abs 04/27/2024 5.4  1.7 - 7.7 K/uL Final   Lymphocytes Relative 04/27/2024 22  % Final   Lymphs Abs 04/27/2024 1.8  0.7 - 4.0 K/uL Final   Monocytes Relative 04/27/2024 13  % Final   Monocytes Absolute 04/27/2024 1.1 (H)  0.1 - 1.0 K/uL Final   Eosinophils Relative 04/27/2024 0  % Final   Eosinophils Absolute 04/27/2024 0.0  0.0 - 0.5 K/uL Final   Basophils Relative 04/27/2024 1  % Final   Basophils Absolute 04/27/2024 0.1  0.0 - 0.1 K/uL Final   Immature Granulocytes 04/27/2024 1  % Final   Abs Immature Granulocytes 04/27/2024 0.04  0.00 - 0.07 K/uL Final   Performed at [facility], [address] [place], [place] [address]   Sodium 04/27/2024 132 (L)  135 - 145 mmol/L Final   Potassium 04/27/2024 3.6  3.5 - 5.1 mmol/L Final   Chloride 04/27/2024 98  98 - 111 mmol/L Final   CO2 04/27/2024 25  22 - 32 mmol/L Final   Glucose, Bld 04/27/2024 128 (H)  70 - 99 mg/dL Final   Glucose reference range applies only to samples taken after fasting for at least 8 hours.   BUN 04/27/2024 13  6 - 20 mg/dL Final   Creatinine, Ser 04/27/2024 0.52 (L)  0.61 - 1.24 mg/dL Final   Calcium 92/73/7974 8.9  8.9 - 10.3 mg/dL Final   GFR, Estimated 04/27/2024 >60  >60 mL/min Final   Comment: (NOTE) Calculated using the CKD-EPI Creatinine Equation (2021)    Anion gap 04/27/2024 9  5 - 15 Final   Performed at [facility], [address] [place], [place] [address]   Alcohol, Ethyl (B) 04/27/2024 <15  <15 mg/dL Final   Comment: (NOTE) For medical purposes only. Performed at [facility], [address] [place], [place] [address]    Opiates 04/27/2024 POSITIVE (A)  NONE DETECTED Final   Cocaine  04/27/2024 POSITIVE (A)  NONE DETECTED Final   Benzodiazepines 04/27/2024 NONE DETECTED  NONE DETECTED Final   Amphetamines 04/27/2024 NONE DETECTED  NONE DETECTED Final   Tetrahydrocannabinol 04/27/2024 POSITIVE (A)  NONE DETECTED  Final   Barbiturates 04/27/2024 NONE DETECTED  NONE DETECTED Final   Comment: (NOTE) DRUG SCREEN FOR MEDICAL PURPOSES ONLY.  IF CONFIRMATION IS NEEDED FOR ANY PURPOSE, NOTIFY LAB WITHIN 5 DAYS.  LOWEST DETECTABLE LIMITS FOR URINE DRUG SCREEN Drug Class                     Cutoff (ng/mL) Amphetamine and metabolites    1000 Barbiturate and metabolites    200 Benzodiazepine                 200 Opiates and metabolites        300 Cocaine  and metabolites        300 THC                            50 Performed at [facility], [address], [place], [place] [address]   Admission on 04/09/2024, Discharged on 04/10/2024  Component Date Value Ref Range Status   Sodium 04/09/2024 136  135 - 145 mmol/L Final   Potassium 04/09/2024 3.2 (L)  3.5 - 5.1 mmol/L Final   Chloride 04/09/2024 100  98 - 111 mmol/L Final   CO2 04/09/2024 22  22 - 32 mmol/L Final   Glucose, Bld 04/09/2024 94  70 - 99 mg/dL Final   Glucose reference range applies only to samples taken after fasting for at least 8 hours.   BUN 04/09/2024 <5 (L)  6 - 20 mg/dL Final   Creatinine, Ser 04/09/2024 0.59 (L)  0.61 - 1.24 mg/dL Final   Calcium 92/91/7974 8.7 (L)  8.9 - 10.3 mg/dL Final   Total Protein 92/91/7974 8.5 (H)  6.5 - 8.1 g/dL Final   Albumin  04/09/2024 4.0  3.5 - 5.0 g/dL Final   AST 92/91/7974 78 (H)  15 - 41 U/L Final   ALT 04/09/2024 33  0 - 44 U/L Final   Alkaline Phosphatase 04/09/2024 120  38 - 126 U/L Final   Total Bilirubin 04/09/2024 0.7  0.0 - 1.2 mg/dL Final   GFR, Estimated 04/09/2024 >60  >60 mL/min Final   Comment: (NOTE) Calculated using the CKD-EPI Creatinine Equation (2021)    Anion gap 04/09/2024 14  5 - 15 Final   Performed at [facility], [address] [place], [place] [address]   Alcohol, Ethyl (B) 04/09/2024 125 (H)  <15 mg/dL Final   Comment: (NOTE) For medical purposes only. Performed at [facility], [address] [place], [place] [address]    Opiates 04/09/2024 NONE DETECTED  NONE DETECTED Final   Cocaine  04/09/2024 POSITIVE (A)  NONE DETECTED Final   Benzodiazepines 04/09/2024 POSITIVE (A)  NONE DETECTED Final   Amphetamines 04/09/2024 POSITIVE (A)  NONE DETECTED Final   Comment: (NOTE) Trazodone  is metabolized in vivo to several metabolites, including pharmacologically active m-CPP, which is excreted in the urine. Immunoassay screens for amphetamines and MDMA have potential cross-reactivity with these compounds and may provide false positive  results.     Tetrahydrocannabinol 04/09/2024 NONE DETECTED  NONE DETECTED Final   Barbiturates 04/09/2024 NONE DETECTED  NONE DETECTED Final   Comment: (NOTE) DRUG SCREEN FOR MEDICAL PURPOSES ONLY.  IF CONFIRMATION IS NEEDED FOR ANY PURPOSE, NOTIFY LAB WITHIN 5 DAYS.  LOWEST DETECTABLE LIMITS FOR URINE DRUG SCREEN Drug Class                     Cutoff (ng/mL) Amphetamine and metabolites    1000 Barbiturate and metabolites    200 Benzodiazepine                 200 Opiates and metabolites        300 Cocaine  and metabolites        300 THC                            50 Performed at [facility], [address], [place], [place] [address]    WBC 04/09/2024 8.1  4.0 - 10.5 K/uL Final   RBC 04/09/2024 4.07 (L)  4.22 - 5.81 MIL/uL Final   Hemoglobin 04/09/2024 11.2 (L)  13.0 - 17.0 g/dL Final   HCT 92/91/7974 34.8 (L)  39.0 - 52.0 % Final   MCV 04/09/2024 85.5  80.0 - 100.0 fL Final   MCH 04/09/2024 27.5  26.0 - 34.0 pg Final   MCHC 04/09/2024 32.2  30.0 - 36.0 g/dL Final   RDW 92/91/7974 13.2  11.5 - 15.5 % Final   Platelets 04/09/2024 411 (H)  150 - 400 K/uL Final   nRBC 04/09/2024 0.0  0.0 - 0.2 % Final   Neutrophils Relative % 04/09/2024 60  % Final   Neutro Abs 04/09/2024 4.9  1.7 - 7.7 K/uL Final   Lymphocytes Relative 04/09/2024 30  % Final   Lymphs Abs 04/09/2024 2.4  0.7 - 4.0 K/uL Final   Monocytes Relative  04/09/2024 7  % Final   Monocytes Absolute 04/09/2024 0.6  0.1 - 1.0 K/uL Final   Eosinophils Relative 04/09/2024 1  % Final   Eosinophils Absolute 04/09/2024 0.1  0.0 - 0.5 K/uL Final   Basophils Relative 04/09/2024 2  % Final   Basophils Absolute 04/09/2024 0.1  0.0 - 0.1 K/uL Final   Immature Granulocytes 04/09/2024 0  % Final   Abs Immature Granulocytes 04/09/2024 0.02  0.00 - 0.07 K/uL Final   Performed at [facility], [address] [place], [place]  [address]  Admission on 04/08/2024, Discharged on 04/08/2024  Component Date Value Ref Range Status   Sodium 04/08/2024 136  135 - 145 mmol/L Final   Potassium 04/08/2024 3.3 (L)  3.5 - 5.1 mmol/L Final   Chloride 04/08/2024 97 (L)  98 - 111 mmol/L Final   CO2 04/08/2024 22  22 - 32 mmol/L Final   Glucose, Bld 04/08/2024 96  70 - 99 mg/dL Final   Glucose reference range applies only to samples taken after fasting for at least 8 hours.   BUN 04/08/2024 5 (L)  6 - 20 mg/dL Final   Creatinine, Ser 04/08/2024 0.61  0.61 - 1.24 mg/dL Final   Calcium 92/92/7974 8.6 (L)  8.9 - 10.3 mg/dL Final   Total Protein 92/92/7974 7.9  6.5 - 8.1 g/dL Final   Albumin  04/08/2024 3.6  3.5 - 5.0 g/dL Final   AST 92/92/7974 71 (H)  15 - 41 U/L Final   ALT 04/08/2024 30  0 - 44 U/L Final   Alkaline Phosphatase 04/08/2024 100  38 - 126 U/L Final   Total Bilirubin 04/08/2024 0.5  0.0 - 1.2 mg/dL Final   GFR, Estimated 04/08/2024 >60  >60 mL/min Final   Comment: (NOTE) Calculated using the CKD-EPI Creatinine Equation (2021)    Anion gap 04/08/2024 17 (H)  5 - 15 Final   Performed at [facility], [address] [place], [place] [address]   Alcohol, Ethyl (B) 04/08/2024 100 (H)  <15 mg/dL Final   Comment: (NOTE) For medical purposes only. Performed at [facility], [address] [place], [place] [address]    WBC 04/08/2024 6.8  4.0 - 10.5 K/uL Final   RBC 04/08/2024 4.14 (L)  4.22 - 5.81 MIL/uL Final   Hemoglobin  04/08/2024 11.6 (L)  13.0 - 17.0 g/dL Final   HCT 92/92/7974 35.0 (L)  39.0 - 52.0 % Final   MCV 04/08/2024 84.5  80.0 - 100.0 fL Final   MCH 04/08/2024 28.0  26.0 - 34.0 pg Final   MCHC 04/08/2024 33.1  30.0 - 36.0 g/dL Final   RDW 92/92/7974 13.2  11.5 - 15.5 % Final   Platelets 04/08/2024 407 (H)  150 - 400 K/uL Final   nRBC 04/08/2024 0.0  0.0 - 0.2 % Final   Performed at [facility], [address], [place], [place] [address]   Opiates 04/08/2024 NONE DETECTED  NONE DETECTED Final   Cocaine  04/08/2024 POSITIVE (A)  NONE DETECTED Final   Benzodiazepines 04/08/2024 POSITIVE (A)  NONE DETECTED Final   Amphetamines 04/08/2024 POSITIVE (A)  NONE DETECTED Final   Comment: (NOTE) Trazodone  is metabolized in vivo to several metabolites, including pharmacologically active m-CPP, which is excreted in the urine. Immunoassay screens for amphetamines and MDMA have potential cross-reactivity with these compounds and may provide false positive  results.     Tetrahydrocannabinol 04/08/2024 NONE DETECTED  NONE DETECTED Final   Barbiturates 04/08/2024 NONE DETECTED  NONE DETECTED Final   Comment: (NOTE) DRUG SCREEN FOR MEDICAL PURPOSES ONLY.  IF CONFIRMATION IS NEEDED FOR ANY PURPOSE, NOTIFY LAB WITHIN 5 DAYS.  LOWEST DETECTABLE LIMITS FOR URINE DRUG SCREEN Drug Class                     Cutoff (ng/mL) Amphetamine and metabolites    1000 Barbiturate and metabolites    200 Benzodiazepine                 200 Opiates  and metabolites        300 Cocaine  and metabolites        300 THC                            50 Performed at [facility], [address], [place], [place] [address]    Acetaminophen  (Tylenol ), Serum 04/08/2024 <10 (L)  10 - 30 ug/mL Final   Comment: (NOTE) Therapeutic concentrations vary significantly. A range of 10-30 ug/mL  may be an effective concentration for many patients. However, some  are best treated at concentrations outside of this  range. Acetaminophen  concentrations >150 ug/mL at 4 hours after ingestion  and >50 ug/mL at 12 hours after ingestion are often associated with  toxic reactions.  Performed at [facility], [address], [place], [place] [address]    Salicylate Lvl 04/08/2024 <7.0 (L)  7.0 - 30.0 mg/dL Final   Performed at [facility], [address] [place], [place] [address]  Admission on 04/03/2024, Discharged on 04/03/2024  Component Date Value Ref Range Status   Sodium 04/03/2024 134 (L)  135 - 145 mmol/L Final   Potassium 04/03/2024 3.7  3.5 - 5.1 mmol/L Final   Chloride 04/03/2024 98  98 - 111 mmol/L Final   CO2 04/03/2024 24  22 - 32 mmol/L Final   Glucose, Bld 04/03/2024 94  70 - 99 mg/dL Final   Glucose reference range applies only to samples taken after fasting for at least 8 hours.   BUN 04/03/2024 6  6 - 20 mg/dL Final   Creatinine, Ser 04/03/2024 0.53 (L)  0.61 - 1.24 mg/dL Final   Calcium 92/97/7974 8.9  8.9 - 10.3 mg/dL Final   Total Protein 92/97/7974 8.7 (H)  6.5 - 8.1 g/dL Final   Albumin  04/03/2024 3.8  3.5 - 5.0 g/dL Final   AST 92/97/7974 52 (H)  15 - 41 U/L Final   ALT 04/03/2024 25  0 - 44 U/L Final   Alkaline Phosphatase 04/03/2024 114  38 - 126 U/L Final   Total Bilirubin 04/03/2024 0.6  0.0 - 1.2 mg/dL Final   GFR, Estimated 04/03/2024 >60  >60 mL/min Final   Comment: (NOTE) Calculated using the CKD-EPI Creatinine Equation (2021)    Anion gap 04/03/2024 12  5 - 15 Final   Performed at [facility], [address] [place], [place] [address]   WBC 04/03/2024 7.8  4.0 - 10.5 K/uL Final   RBC 04/03/2024 3.97 (L)  4.22 - 5.81 MIL/uL Final   Hemoglobin 04/03/2024 11.1 (L)  13.0 - 17.0 g/dL Final   HCT 92/97/7974 34.8 (L)  39.0 - 52.0 % Final   MCV 04/03/2024 87.7  80.0 - 100.0 fL Final   MCH 04/03/2024 28.0  26.0 - 34.0 pg Final   MCHC 04/03/2024 31.9  30.0 - 36.0 g/dL Final   RDW 92/97/7974 13.4  11.5 - 15.5 % Final   Platelets  04/03/2024 356  150 - 400 K/uL Final   nRBC 04/03/2024 0.0  0.0 - 0.2 % Final   Neutrophils Relative % 04/03/2024 54  % Final   Neutro Abs 04/03/2024 4.1  1.7 - 7.7 K/uL Final   Lymphocytes Relative 04/03/2024 34  % Final   Lymphs Abs 04/03/2024 2.6  0.7 - 4.0 K/uL Final   Monocytes Relative 04/03/2024 11  % Final   Monocytes Absolute 04/03/2024 0.9  0.1 - 1.0 K/uL  Final   Eosinophils Relative 04/03/2024 0  % Final   Eosinophils Absolute 04/03/2024 0.0  0.0 - 0.5 K/uL Final   Basophils Relative 04/03/2024 1  % Final   Basophils Absolute 04/03/2024 0.1  0.0 - 0.1 K/uL Final   Immature Granulocytes 04/03/2024 0  % Final   Abs Immature Granulocytes 04/03/2024 0.02  0.00 - 0.07 K/uL Final   Performed at [facility], [address], [place], [place] [address]   Sed Rate 04/03/2024 30 (H)  0 - 16 mm/hr Final   Performed at [facility], [address] [place], [place] [address]   CRP 04/03/2024 1.3 (H)  <1.0 mg/dL Final   Performed at [facility], [address] [place], [place] [address]  Admission on 03/30/2024, Discharged on 04/01/2024  Component Date Value Ref Range Status   Glucose-Capillary 03/30/2024 107 (H)  70 - 99 mg/dL Final   Glucose reference range applies only to samples taken after fasting for at least 8 hours.   Sodium 03/30/2024 142  135 - 145 mmol/L Final   Potassium 03/30/2024 3.3 (L)  3.5 - 5.1 mmol/L Final   Chloride 03/30/2024 106  98 - 111 mmol/L Final   CO2 03/30/2024 23  22 - 32 mmol/L Final   Glucose, Bld 03/30/2024 107 (H)  70 - 99 mg/dL Final   Glucose reference range applies only to samples taken after fasting for at least 8 hours.   BUN 03/30/2024 5 (L)  6 - 20 mg/dL Final   Creatinine, Ser 03/30/2024 0.58 (L)  0.61 - 1.24 mg/dL Final   Calcium 93/71/7974 8.1 (L)  8.9 - 10.3 mg/dL Final   Total Protein 93/71/7974 7.4  6.5 - 8.1 g/dL Final   Albumin  03/30/2024 3.5  3.5 - 5.0 g/dL Final   AST 93/71/7974 53 (H)  15  - 41 U/L Final   ALT 03/30/2024 25  0 - 44 U/L Final   Alkaline Phosphatase 03/30/2024 103  38 - 126 U/L Final   Total Bilirubin 03/30/2024 0.4  0.0 - 1.2 mg/dL Final   GFR, Estimated 03/30/2024 >60  >60 mL/min Final   Comment: (NOTE) Calculated using the CKD-EPI Creatinine Equation (2021)    Anion gap 03/30/2024 13  5 - 15 Final   Performed at [facility], [address] [place], [place] [address]   Salicylate Lvl 03/30/2024 <7.0 (L)  7.0 - 30.0 mg/dL Final   Performed at [facility], [address] [place], [place] [address]   Acetaminophen  (Tylenol ), Serum 03/30/2024 <10 (L)  10 - 30 ug/mL Final   Comment: (NOTE) Therapeutic concentrations vary significantly. A range of 10-30 ug/mL  may be an effective concentration for many patients. However, some  are best treated at concentrations outside of this range. Acetaminophen  concentrations >150 ug/mL at 4 hours after ingestion  and >50 ug/mL at 12 hours after ingestion are often associated with  toxic reactions.  Performed at [facility], [address] [place], [place] [address]    Alcohol, Ethyl (B) 03/30/2024 243 (H)  <15 mg/dL Final   Comment: (NOTE) For medical purposes only. Performed at [facility], [address], [place], [place] [address]    Opiates 03/30/2024 NONE DETECTED  NONE DETECTED Final   Cocaine  03/30/2024 POSITIVE (A)  NONE DETECTED Final   Benzodiazepines 03/30/2024 POSITIVE (A)  NONE DETECTED Final   Amphetamines 03/30/2024 NONE DETECTED  NONE DETECTED Final   Tetrahydrocannabinol  03/30/2024 NONE DETECTED  NONE DETECTED Final   Barbiturates 03/30/2024 NONE DETECTED  NONE DETECTED Final   Comment: (NOTE) DRUG SCREEN FOR MEDICAL PURPOSES ONLY.  IF CONFIRMATION IS NEEDED FOR ANY PURPOSE, NOTIFY LAB WITHIN 5 DAYS.  LOWEST DETECTABLE LIMITS FOR URINE DRUG SCREEN Drug Class                     Cutoff (ng/mL) Amphetamine and metabolites    1000 Barbiturate and metabolites     200 Benzodiazepine                 200 Opiates and metabolites        300 Cocaine  and metabolites        300 THC                            50 Performed at [facility], [address] [place], [place] [address]    WBC 03/30/2024 6.0  4.0 - 10.5 K/uL Final   RBC 03/30/2024 3.66 (L)  4.22 - 5.81 MIL/uL Final   Hemoglobin 03/30/2024 10.6 (L)  13.0 - 17.0 g/dL Final   HCT 93/71/7974 32.1 (L)  39.0 - 52.0 % Final   MCV 03/30/2024 87.7  80.0 - 100.0 fL Final   MCH 03/30/2024 29.0  26.0 - 34.0 pg Final   MCHC 03/30/2024 33.0  30.0 - 36.0 g/dL Final   RDW 93/71/7974 13.2  11.5 - 15.5 % Final   Platelets 03/30/2024 307  150 - 400 K/uL Final   nRBC 03/30/2024 0.0  0.0 - 0.2 % Final   Neutrophils Relative % 03/30/2024 57  % Final   Neutro Abs 03/30/2024 3.5  1.7 - 7.7 K/uL Final   Lymphocytes Relative 03/30/2024 33  % Final   Lymphs Abs 03/30/2024 2.0  0.7 - 4.0 K/uL Final   Monocytes Relative 03/30/2024 7  % Final   Monocytes Absolute 03/30/2024 0.4  0.1 - 1.0 K/uL Final   Eosinophils Relative 03/30/2024 1  % Final   Eosinophils Absolute 03/30/2024 0.1  0.0 - 0.5 K/uL Final   Basophils Relative 03/30/2024 1  % Final   Basophils Absolute 03/30/2024 0.1  0.0 - 0.1 K/uL Final   Immature Granulocytes 03/30/2024 1  % Final   Abs Immature Granulocytes 03/30/2024 0.03  0.00 - 0.07 K/uL Final   Performed at [facility], [address], [place], [place] [address]  Admission on 03/29/2024, Discharged on 03/29/2024  Component Date Value Ref Range Status   Sodium 03/29/2024 140  135 - 145 mmol/L Final   Potassium 03/29/2024 3.2 (L)  3.5 - 5.1 mmol/L Final   Chloride 03/29/2024 103  98 - 111 mmol/L Final   CO2 03/29/2024 27  22 - 32 mmol/L Final   Glucose, Bld 03/29/2024 101 (H)  70 - 99 mg/dL Final   Glucose reference range applies only to samples taken after fasting for at least 8 hours.   BUN 03/29/2024 8  6 - 20 mg/dL Final   Creatinine, Ser 03/29/2024 0.55 (L)  0.61 - 1.24 mg/dL  Final   Calcium 93/72/7974 8.4 (L)  8.9 - 10.3 mg/dL Final   Total Protein 93/72/7974 7.0  6.5 - 8.1 g/dL Final   Albumin  03/29/2024 3.3 (L)  3.5 - 5.0 g/dL Final   AST 93/72/7974 43 (H)  15 - 41 U/L Final   ALT 03/29/2024 20  0 - 44 U/L Final  Alkaline Phosphatase 03/29/2024 107  38 - 126 U/L Final   Total Bilirubin 03/29/2024 0.5  0.0 - 1.2 mg/dL Final   GFR, Estimated 03/29/2024 >60  >60 mL/min Final   Comment: (NOTE) Calculated using the CKD-EPI Creatinine Equation (2021)    Anion gap 03/29/2024 10  5 - 15 Final   Performed at [facility], [address], [place], [place] [address]   Opiates 03/29/2024 NONE DETECTED  NONE DETECTED Final   Cocaine  03/29/2024 POSITIVE (A)  NONE DETECTED Final   Benzodiazepines 03/29/2024 POSITIVE (A)  NONE DETECTED Final   Amphetamines 03/29/2024 NONE DETECTED  NONE DETECTED Final   Tetrahydrocannabinol 03/29/2024 NONE DETECTED  NONE DETECTED Final   Barbiturates 03/29/2024 NONE DETECTED  NONE DETECTED Final   Comment: (NOTE) DRUG SCREEN FOR MEDICAL PURPOSES ONLY.  IF CONFIRMATION IS NEEDED FOR ANY PURPOSE, NOTIFY LAB WITHIN 5 DAYS.  LOWEST DETECTABLE LIMITS FOR URINE DRUG SCREEN Drug Class                     Cutoff (ng/mL) Amphetamine and metabolites    1000 Barbiturate and metabolites    200 Benzodiazepine                 200 Opiates and metabolites        300 Cocaine  and metabolites        300 THC                            50 Performed at [facility], [address] [place], [place] [address]    WBC 03/29/2024 3.4 (L)  4.0 - 10.5 K/uL Final   RBC 03/29/2024 3.37 (L)  4.22 - 5.81 MIL/uL Final   Hemoglobin 03/29/2024 9.7 (L)  13.0 - 17.0 g/dL Final   HCT 93/72/7974 30.5 (L)  39.0 - 52.0 % Final   MCV 03/29/2024 90.5  80.0 - 100.0 fL Final   MCH 03/29/2024 28.8  26.0 - 34.0 pg Final   MCHC 03/29/2024 31.8  30.0 - 36.0 g/dL Final   RDW 93/72/7974 13.1  11.5 - 15.5 % Final   Platelets  03/29/2024 204  150 - 400 K/uL Final   nRBC 03/29/2024 0.0  0.0 - 0.2 % Final   Neutrophils Relative % 03/29/2024 43  % Final   Neutro Abs 03/29/2024 1.5 (L)  1.7 - 7.7 K/uL Final   Lymphocytes Relative 03/29/2024 43  % Final   Lymphs Abs 03/29/2024 1.5  0.7 - 4.0 K/uL Final   Monocytes Relative 03/29/2024 12  % Final   Monocytes Absolute 03/29/2024 0.4  0.1 - 1.0 K/uL Final   Eosinophils Relative 03/29/2024 1  % Final   Eosinophils Absolute 03/29/2024 0.0  0.0 - 0.5 K/uL Final   Basophils Relative 03/29/2024 1  % Final   Basophils Absolute 03/29/2024 0.0  0.0 - 0.1 K/uL Final   Immature Granulocytes 03/29/2024 0  % Final   Abs Immature Granulocytes 03/29/2024 0.01  0.00 - 0.07 K/uL Final   Performed at [facility], [address] [place], [place] [address]   Alcohol, Ethyl (B) 03/29/2024 <15  <15 mg/dL Final   Comment: (NOTE) For medical purposes only. Performed at [facility], [address] [place], [place] [address]   Admission on 03/27/2024, Discharged on 03/28/2024  Component Date Value Ref Range Status   Glucose-Capillary 03/28/2024 131 (H)  70 - 99 mg/dL Final  Glucose reference range applies only to samples taken after fasting for at least 8 hours.   Sodium 03/27/2024 137  135 - 145 mmol/L Final   Potassium 03/27/2024 3.2 (L)  3.5 - 5.1 mmol/L Final   Chloride 03/27/2024 101  98 - 111 mmol/L Final   CO2 03/27/2024 24  22 - 32 mmol/L Final   Glucose, Bld 03/27/2024 114 (H)  70 - 99 mg/dL Final   Glucose reference range applies only to samples taken after fasting for at least 8 hours.   BUN 03/27/2024 <5 (L)  6 - 20 mg/dL Final   Creatinine, Ser 03/27/2024 0.62  0.61 - 1.24 mg/dL Final   Calcium 93/74/7974 8.5 (L)  8.9 - 10.3 mg/dL Final   Total Protein 93/74/7974 8.6 (H)  6.5 - 8.1 g/dL Final   Albumin  03/27/2024 4.1  3.5 - 5.0 g/dL Final   AST 93/74/7974 43 (H)  15 - 41 U/L Final   ALT 03/27/2024 22  0 - 44 U/L Final   Alkaline  Phosphatase 03/27/2024 122  38 - 126 U/L Final   Total Bilirubin 03/27/2024 0.7  0.0 - 1.2 mg/dL Final   GFR, Estimated 03/27/2024 >60  >60 mL/min Final   Comment: (NOTE) Calculated using the CKD-EPI Creatinine Equation (2021)    Anion gap 03/27/2024 12  5 - 15 Final   Performed at [facility], [address] [place], [place] [address]   Salicylate Lvl 03/27/2024 <7.0 (L)  7.0 - 30.0 mg/dL Final   Performed at [facility], [address] [place], [place] [address]   Acetaminophen  (Tylenol ), Serum 03/27/2024 <10 (L)  10 - 30 ug/mL Final   Comment: (NOTE) Therapeutic concentrations vary significantly. A range of 10-30 ug/mL  may be an effective concentration for many patients. However, some  are best treated at concentrations outside of this range. Acetaminophen  concentrations >150 ug/mL at 4 hours after ingestion  and >50 ug/mL at 12 hours after ingestion are often associated with  toxic reactions.  Performed at [facility], [address], [place], [place] [address]    Alcohol, Ethyl (B) 03/27/2024 146 (H)  <15 mg/dL Final   Comment: (NOTE) For medical purposes only. Performed at [facility], [address], [place], [place] [address]    WBC 03/27/2024 8.0  4.0 - 10.5 K/uL Final   RBC 03/27/2024 3.83 (L)  4.22 - 5.81 MIL/uL Final   Hemoglobin 03/27/2024 10.9 (L)  13.0 - 17.0 g/dL Final   HCT 93/74/7974 34.2 (L)  39.0 - 52.0 % Final   MCV 03/27/2024 89.3  80.0 - 100.0 fL Final   MCH 03/27/2024 28.5  26.0 - 34.0 pg Final   MCHC 03/27/2024 31.9  30.0 - 36.0 g/dL Final   RDW 93/74/7974 13.2  11.5 - 15.5 % Final   Platelets 03/27/2024 318  150 - 400 K/uL Final   nRBC 03/27/2024 0.0  0.0 - 0.2 % Final   Neutrophils Relative % 03/27/2024 73  % Final   Neutro Abs 03/27/2024 5.8  1.7 - 7.7 K/uL Final   Lymphocytes Relative 03/27/2024 20  % Final   Lymphs Abs 03/27/2024 1.6  0.7 - 4.0 K/uL Final    Monocytes Relative 03/27/2024 6  % Final   Monocytes Absolute 03/27/2024 0.5  0.1 - 1.0 K/uL Final   Eosinophils Relative 03/27/2024 0  % Final   Eosinophils Absolute 03/27/2024 0.0  0.0 - 0.5 K/uL Final   Basophils Relative 03/27/2024  1  % Final   Basophils Absolute 03/27/2024 0.1  0.0 - 0.1 K/uL Final   Immature Granulocytes 03/27/2024 0  % Final   Abs Immature Granulocytes 03/27/2024 0.03  0.00 - 0.07 K/uL Final   Performed at [facility], [address] [place], [place] [address]  Admission on 03/27/2024, Discharged on 03/27/2024  Component Date Value Ref Range Status   Sodium 03/27/2024 135  135 - 145 mmol/L Final   Potassium 03/27/2024 3.4 (L)  3.5 - 5.1 mmol/L Final   Chloride 03/27/2024 101  98 - 111 mmol/L Final   CO2 03/27/2024 20 (L)  22 - 32 mmol/L Final   Glucose, Bld 03/27/2024 161 (H)  70 - 99 mg/dL Final   Glucose reference range applies only to samples taken after fasting for at least 8 hours.   BUN 03/27/2024 9  6 - 20 mg/dL Final   Creatinine, Ser 03/27/2024 0.84  0.61 - 1.24 mg/dL Final   Calcium 93/74/7974 9.0  8.9 - 10.3 mg/dL Final   Total Protein 93/74/7974 8.6 (H)  6.5 - 8.1 g/dL Final   Albumin  03/27/2024 4.0  3.5 - 5.0 g/dL Final   AST 93/74/7974 46 (H)  15 - 41 U/L Final   ALT 03/27/2024 23  0 - 44 U/L Final   Alkaline Phosphatase 03/27/2024 114  38 - 126 U/L Final   Total Bilirubin 03/27/2024 0.4  0.0 - 1.2 mg/dL Final   GFR, Estimated 03/27/2024 >60  >60 mL/min Final   Comment: (NOTE) Calculated using the CKD-EPI Creatinine Equation (2021)    Anion gap 03/27/2024 14  5 - 15 Final   Performed at [facility], [address], [place], [place] [address]   Alcohol, Ethyl (B) 03/27/2024 48 (H)  <15 mg/dL Final   Comment: (NOTE) For medical purposes only. Performed at [facility], [address], [place], [place] [address]    WBC 03/27/2024 8.9  4.0 - 10.5 K/uL Final   RBC 03/27/2024 3.95 (L)  4.22 - 5.81 MIL/uL Final    Hemoglobin 03/27/2024 11.0 (L)  13.0 - 17.0 g/dL Final   HCT 93/74/7974 35.0 (L)  39.0 - 52.0 % Final   MCV 03/27/2024 88.6  80.0 - 100.0 fL Final   MCH 03/27/2024 27.8  26.0 - 34.0 pg Final   MCHC 03/27/2024 31.4  30.0 - 36.0 g/dL Final   RDW 93/74/7974 13.2  11.5 - 15.5 % Final   Platelets 03/27/2024 346  150 - 400 K/uL Final   nRBC 03/27/2024 0.0  0.0 - 0.2 % Final   Performed at [facility], [address], [place], [place] [address]   Opiates 03/27/2024 POSITIVE (A)  NONE DETECTED Final   Cocaine  03/27/2024 POSITIVE (A)  NONE DETECTED Final   Benzodiazepines 03/27/2024 POSITIVE (A)  NONE DETECTED Final   Amphetamines 03/27/2024 NONE DETECTED  NONE DETECTED Final   Tetrahydrocannabinol 03/27/2024 NONE DETECTED  NONE DETECTED Final   Barbiturates 03/27/2024 NONE DETECTED  NONE DETECTED Final   Comment: (NOTE) DRUG SCREEN FOR MEDICAL PURPOSES ONLY.  IF CONFIRMATION IS NEEDED FOR ANY PURPOSE, NOTIFY LAB WITHIN 5 DAYS.  LOWEST DETECTABLE LIMITS FOR URINE DRUG SCREEN Drug Class                     Cutoff (ng/mL) Amphetamine and metabolites    1000 Barbiturate and metabolites    200 Benzodiazepine  200 Opiates and metabolites        300 Cocaine  and metabolites        300 THC                            50 Performed at [facility], [address] [place], [place] [address]    Salicylate Lvl 03/27/2024 <7.0 (L)  7.0 - 30.0 mg/dL Final   Performed at [facility], [address], [place], [place] [address]   Acetaminophen  (Tylenol ), Serum 03/27/2024 <10 (L)  10 - 30 ug/mL Final   Comment: (NOTE) Therapeutic concentrations vary significantly. A range of 10-30 ug/mL  may be an effective concentration for many patients. However, some  are best treated at concentrations outside of this range. Acetaminophen  concentrations >150 ug/mL at 4 hours after ingestion  and >50 ug/mL at 12 hours after ingestion are often associated with  toxic  reactions.  Performed at [facility], [address], [place], [place] [address]    Glucose-Capillary 03/27/2024 160 (H)  70 - 99 mg/dL Final   Glucose reference range applies only to samples taken after fasting for at least 8 hours.   Lipase 03/27/2024 27  11 - 51 U/L Final   Performed at [facility], [address] [place], [place] [address]  There may be more visits with results that are not included.    Allergies: Fish allergy, Shellfish allergy, and Codeine  Medications:  Facility Ordered Medications  Medication   acetaminophen  (TYLENOL ) tablet 650 mg   alum & mag hydroxide-simeth (MAALOX/MYLANTA) 200-200-20 MG/5ML suspension 30 mL   magnesium  hydroxide (MILK OF MAGNESIA) suspension 30 mL   haloperidol  (HALDOL ) tablet 5 mg   And   diphenhydrAMINE  (BENADRYL ) capsule 50 mg   acetaminophen  (TYLENOL ) tablet 650 mg   alum & mag hydroxide-simeth (MAALOX/MYLANTA) 200-200-20 MG/5ML suspension 30 mL   magnesium  hydroxide (MILK OF MAGNESIA) suspension 30 mL   OLANZapine  zydis (ZYPREXA ) disintegrating tablet 5 mg   haloperidol  lactate (HALDOL ) injection 5 mg   And   diphenhydrAMINE  (BENADRYL ) injection 50 mg   haloperidol  lactate (HALDOL ) injection 10 mg   And   diphenhydrAMINE  (BENADRYL ) injection 50 mg   [START ON 05/11/2024] nicotine  (NICODERM CQ  - dosed in mg/24 hours) patch 21 mg   thiamine  (VITAMIN B1) injection 100 mg   multivitamin with minerals tablet 1 tablet   chlordiazePOXIDE  (LIBRIUM ) capsule 25 mg   dicyclomine  (BENTYL ) tablet 20 mg   hydrOXYzine  (ATARAX ) tablet 25 mg   loperamide  (IMODIUM ) capsule 2-4 mg   methocarbamol  (ROBAXIN ) tablet 500 mg   naproxen  (NAPROSYN ) tablet 500 mg   ondansetron  (ZOFRAN -ODT) disintegrating tablet 4 mg   cloNIDine  (CATAPRES ) tablet 0.1 mg   Followed by   NOREEN ON 05/12/2024] cloNIDine  (CATAPRES ) tablet 0.1 mg   Followed by   NOREEN ON 05/15/2024] cloNIDine  (CATAPRES ) tablet 0.1 mg   chlordiazePOXIDE  (LIBRIUM ) capsule  25 mg   Followed by   NOREEN ON 05/11/2024] chlordiazePOXIDE  (LIBRIUM ) capsule 25 mg   Followed by   NOREEN ON 05/12/2024] chlordiazePOXIDE  (LIBRIUM ) capsule 25 mg   Followed by   NOREEN ON 05/13/2024] chlordiazePOXIDE  (LIBRIUM ) capsule 25 mg   multivitamin with minerals tablet 1 tablet   QUEtiapine  (SEROQUEL ) tablet 50 mg   [START ON 05/11/2024] ARIPiprazole  (ABILIFY ) tablet 5 mg   [START ON 05/11/2024] sertraline  (ZOLOFT ) tablet 25 mg    Long Term Goals: Improvement in symptoms so  as ready for discharge  Short Term Goals: Patient will verbalize feelings in meetings with treatment team members., Patient will attend at least of 50% of the groups daily., Pt will complete the PHQ9 on admission, day 3 and discharge., Patient will participate in completing the [place] Suicide Severity Rating Scale, and Patient will take medications as prescribed daily.  Medical Decision Making  33 year old male with a history of substance use including heroine and alcohol. He was discharged 3 days ago from inpatient psych and was taking Abilify , Sertraline  and Seroquel . He has a history of hearing diagnoses of Bipolar disorder and Schizophrenia. He says that he hears voices daily. They are made worse when he is using substances and when he does not take psychiatric medication.    Alcohol and heroine dep Librium  and Clonidine  withdrawal protocols Review and order labs not ordered RPR, HIV and f/u results  Schizoaffective disorder Restart medication that he was recently discharged with. Abilify , Seroquel  and Sertraline  Titrate doses based on response Lipid and Hemoglobin A1C  TSH, due to worsening depression and rule out hypothyroidism Lipid, standard lab ordered for all patients received an antipsychotic therapy Ethanol, standard order for all patient reporting alcohol misuse or overuse Hemoglobin A1c, standard lab ordered for all patients received an antipsychotic therapy CBC, standing to rule out any  infection, immunosuppression or anemia CMP, rule out metabolic conditions which may be contributing to mental health symptoms PRNS -Start Hydroxyzine  25 mg TID PRN for anxiety -Continue Tylenol  650 mg every 6 hours PRN for mild pain -Continue Maalox 30 mg every 4 hrs PRN for indigestion -Continue Milk of Magnesia as needed every 6 hrs for constipation -Continue Agitation protocol meds as per the MAR-See [facility] for details  F/u on EKG result    Recommendations  Based on my evaluation the patient does not appear to have an emergency medical condition.  Garvin JINNY Gaines, MD 05/10/24  3:57 PM

## 2024-05-10 NOTE — ED Notes (Signed)
 Patient admitted from [facility] to [facility] requesting assistance in detox. Calm, cooperative throughout interview process. Skin assessment completed, findings documented. Oriented to unit. Meal and drink offered. Patient alert & oriented x4. Denies intent to harm self or others when asked. Denies A/VH. Patient reports generalized pain due to withdrawal rating 5/10. At time of assessment no orders were placed to allow for PRN medication administration. Writer informed patient that medication would be brought as soon as possible to alleviate pain. Patient voiced understanding. Patient uses a walker at baseline due to chronic pain and post surgical status. No acute distress noted. Support and encouragement provided. Routine safety checks conducted per facility protocol. Encouraged patient to notify staff if any thoughts of harm towards self or others arise. Patient verbalizes understanding and agreement.

## 2024-05-10 NOTE — Group Note (Signed)
 Group Topic: Social Support  Group Date: 05/10/2024 Start Time: 1155 End Time: 1230 Facilitators: Arley Garant, Zane HERO, RN  Department: [facility]  Number of Participants: 1  Group Focus: check in and daily focus Treatment Modality:  Individual Therapy Interventions utilized were support Purpose: express feelings, increase insight, and reinforce self-care  Name: Mark Hammond Date of Birth: [birth year]  MR: [id number]    Level of Participation: active Quality of Participation: attentive and cooperative Interactions with others: gave feedback Mood/Affect: appropriate and positive Triggers (if applicable): None identified at this time Cognition: coherent/clear and logical Progress: Gaining insight Response: Patient voices that he's doing much better since last admission. 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 Despite this patient remarked on how much better he is ambulating now in comparison to his last stay. Patent voices gratitude for staff and the care they provide.  Support and encouragement provided. Plan: patient will be encouraged to continue to attend group/programming on the unit  Patients Problems:  Patient Active Problem List   Diagnosis Date Noted   Schizoaffective disorder, bipolar type (HCC) 05/10/2024   Polysubstance use disorder 04/28/2024   Cocaine  use disorder, severe, dependence (HCC) 03/31/2024   Osteomyelitis of left hip (HCC) 02/22/2024   Cellulitis 02/20/2024   Severe sepsis (HCC) 01/31/2024   Hypokalemia 01/31/2024   Chronic alcohol abuse 01/31/2024   Anemia of chronic disease 01/31/2024   Transaminitis 01/31/2024   Bipolar 1 disorder (HCC)    Amphetamine use disorder, 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   Polysubstance dependence (HCC) 12/13/2023   Polysubstance abuse (HCC) 11/21/2023   Alcohol use disorder 08/04/2023    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

## 2024-05-10 NOTE — Progress Notes (Signed)
Pt transferred to [facility]. 

## 2024-05-10 NOTE — ED Notes (Signed)
 Pt is seen in bed alert. He did not attend group. He had a snack in the dayroom. He denies SI/HI/AVH. He c/o nausea. He was offered Zofran  but denied needing it anymore. He denies other physical symptoms. He is safe on the unit at this time with Q 15 min safety checks in place.

## 2024-05-10 NOTE — BH Assessment (Addendum)
 Comprehensive Clinical Assessment (CCA) Note  05/10/2024 Mark Hammond [id number]  Disposition: Per Mark Culver, NP admission to [facility] is recommended.    The patient demonstrates the following risk factors for suicide: Chronic risk factors for suicide include: substance use disorder, previous suicide attempts undisclosed number of past attempts by OD on fantanyl, and demographic factors (male, >33 y/o). Acute risk factors for suicide include: unemployment, social withdrawal/isolation, and loss (financial, interpersonal, professional). Protective factors for this patient include: positive social support, hope for the future, and life satisfaction. Considering these factors, the overall suicide risk at this point appears to be low. Patient is appropriate for outpatient follow up, once stabilized.   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  Patient presents unaccompanied, voluntarily.  Patient states he has been experiencing withdrawal symptoms and would like to detox. Patient lists various diagnoses to include depression, Bipolar, Schizophrenia, however he reports he doesn't take his medications because I just get high and drink and push those medications aside.  He has yet to follow up with outpatient MH or SA providers as has been recommended multiple times.   Patient reports he has been using crack cocaine , meth, heroine and alcohol since he was discharged from [facility] on 05/07/2024.   Patient reports hx of NSSIB, by cutting and hx of multiple suicide attempts by fentanyl  overdose. He denies current SI and HI.  He reports ongoing AH since I was born.  He states he hears the voice of god and the devil often.  Patient is seeking detox before he goes to the beach next week with family.  He is not seeking long term residential options this visit.     Chief Complaint:  Chief Complaint  Patient presents with   Withdrawal   Schizophrenia   Depression                 Visit Diagnosis: Substance Induced Mood Disorder vs Schizoaffective Disorder                             Stimulant Use Disorder, cocaine  and meth type                              Alcohol Use Disorder, moderate   CCA Screening, Triage and Referral (STR)  Patient Reported Information How did you hear about us ? Self  What Is the Reason for Your Visit/Call Today? [name], presents to [facility] unaccompanied, voluntarily. PT states he has been experiencing withdrawal symptoms and would like to detox. PT states he has been diagnosed with schizophrenia, depression and BPD and doesn't take his medications like he is supposed to. PT admits to using crack cocaine , meth, heroine yesterday, also drinking 1/5 vodka. PT has hx of self inflicting harm (cuts on arm), multiple suicide attempts by OD'ing on fentanyl . PT states a month ago he had SI, no plan. PT states he recently had thoughts of hurting someone yesterday but none of yall here at this facility. PT endores AVH, hearing religious voices from God and [place]. PT denies HI.  How Long Has This Been Causing You Problems? > than 6 months  What Do You Feel Would Help You the Most Today? Alcohol or Drug Use Treatment; Medication(s)   Have You Recently Had  Any Thoughts About Hurting Yourself? No (PT stated a month ago SI, no plan)  Are You Planning to Commit Suicide/Harm Yourself At This time? No   Flowsheet Row ED from 05/10/2024 in [facility] ED from 05/09/2024 in [facility] Emergency Department at [facility] ED from 04/27/2024 in [facility] Emergency Department at [facility]  C-SSRS RISK CATEGORY Moderate Risk Error: Q3, 4, or 5 should not be populated when Q2 is No High Risk    Have you Recently Had Thoughts About Hurting Someone [name]? Yes  Are You Planning to Harm  Someone at This Time? No  Explanation: N/A   Have You Used Any Alcohol or Drugs in the Past 24 Hours? Yes  How Long Ago Did You Use Drugs or Alcohol? yesterday  What Did You Use and How Much? Crack cocaine  ($20 worth), meth, heroine (1/10 of a gram), liquor (vodka - 1/fifth)   Do You Currently Have a Therapist/Psychiatrist? No  Name of Therapist/Psychiatrist:    Have You Been Recently Discharged From Any Office Practice or Programs? Yes  Explanation of Discharge From Practice/Program: Just d/c from [facility] on 05/07/24     CCA Screening Triage Referral Assessment Type of Contact: Face-to-Face  Telemedicine Service Delivery:   Is this Initial or Reassessment?   Date Telepsych consult ordered in [facility]:  Date Telepsych consult ordered in [facility]: 05/10/24  Time Telepsych consult ordered in [facility]:  Time Telepsych consult ordered in [facility]: 0849  Location of Assessment: [facility] Assessment Services  Provider Location: [facility] Assessment Services   Collateral Involvement: None   Does Patient Have a Automotive engineer Guardian? No  Legal Guardian Contact Information: N/A  Copy of Legal Guardianship Form: -- (N/A)  Legal Guardian Notified of Arrival: -- (N/A)  Legal Guardian Notified of Pending Discharge: -- (N/A)  If Minor and Not Living with Parent(s), Who has Custody? N/A  Is CPS involved or ever been involved? Never  Is APS involved or ever been involved? Never   Patient Determined To Be At Risk for Harm To Self or Others Based on Review of Patient Reported Information or Presenting Complaint? No  Method: -- (N/A, no HI)  Availability of Means: -- (N/A, no HI)  Intent: -- (N/A, no HI)  Notification Required: -- (N/A, no HI)  Additional Information for Danger to Others Potential: Previous attempts  Additional Comments for Danger to Others Potential: N/A, no HI  Are There Guns or Other Weapons in Your Home? No  Types of Guns/Weapons: N/A  Are These Weapons Safely  Secured?                            -- (N/A)  Who Could Verify You Are Able To Have These Secured: N/A  Do You Have any Outstanding Charges, Pending Court Dates, Parole/Probation? N/A  Contacted To Inform of Risk of Harm To Self or Others: -- (N/A)    Does Patient Present under Involuntary Commitment? No    [place] of Residence: [place]   Patient Currently Receiving the Following Services: Not Receiving Services   Determination of Need: Urgent (48 hours)   Options For Referral: Facility-Based Crisis; BH Urgent Care     CCA Biopsychosocial Patient Reported Schizophrenia/Schizoaffective Diagnosis in Past: Yes   Strengths: Patient is seeking treatment. Reports new girlfriend is supportive.   Mental Health Symptoms Depression:  Worthlessness; Difficulty Concentrating (Isolation.)   Duration of Depressive symptoms: Duration of  Depressive Symptoms: Greater than two weeks   Mania:  None   Anxiety:   Tension; Worrying   Psychosis:  Hallucinations   Duration of Psychotic symptoms: Duration of Psychotic Symptoms: Greater than six months   Trauma:  None   Obsessions:  None   Compulsions:  None   Inattention:  N/A   Hyperactivity/Impulsivity:  N/A   Oppositional/Defiant Behaviors:  N/A   Emotional Irregularity:  None   Other Mood/Personality Symptoms:  NA    Mental Status Exam Appearance and self-care  Stature:  Small   Weight:  Average weight   Clothing:  Casual (hospital scrubs)   Grooming:  Normal   Cosmetic use:  None   Posture/gait:  Normal   Motor activity:  Not Remarkable (Pt uses a walker.)   Sensorium  Attention:  Normal   Concentration:  Normal   Orientation:  X5   Recall/memory:  Normal   Affect and Mood  Affect:  Congruent   Mood:  Depressed   Relating  Eye contact:  Normal   Facial expression:  Responsive   Attitude toward examiner:  Cooperative   Thought and Language  Speech flow: Normal   Thought content:   Appropriate to Mood and Circumstances   Preoccupation:  None   Hallucinations:  Auditory   Organization:  Patent examiner of Knowledge:  Average   Intelligence:  Average   Abstraction:  Normal   Judgement:  Fair   Dance movement psychotherapist:  Variable   Insight:  Fair   Decision Making:  Normal   Social Functioning  Social Maturity:  Impulsive; Irresponsible   Social Judgement:  Chief of Staff   Stress  Stressors:  Housing; Illness; Transitions; Financial   Coping Ability:  Exhausted; Overwhelmed   Skill Deficits:  Self-control; Responsibility   Supports:  Friends/Service system     Religion: Religion/Spirituality Are You A Religious Person?: No How Might This Affect Treatment?: NA  Leisure/Recreation: Leisure / Recreation Do You Have Hobbies?: Yes Leisure and Hobbies: Rap, listening to music,  Exercise/Diet: Exercise/Diet Do You Exercise?: No Have You Gained or Lost A Significant Amount of Weight in the Past Six Months?: No Do You Follow a Special Diet?: No Do You Have Any Trouble Sleeping?: No   CCA Employment/Education Employment/Work Situation: Employment / Work Situation Employment Situation: Unemployed Patient's Job has Been Impacted by Current Illness: No Has Patient ever Been in Equities trader?: No  Education: Education Is Patient Currently Attending School?: No Last Grade Completed: 9 Did You Product manager?: No Did You Have An Individualized Education Program (IIEP): No Did You Have Any Difficulty At [employer]?: No Patient's Education Has Been Impacted by Current Illness: No   CCA Family/Childhood History Family and Relationship History: Family history Marital status: Single Does patient have children?: No  Childhood History:  Childhood History By whom was/is the patient raised?: Grandparents Midwife.) Did patient suffer any verbal/emotional/physical/sexual abuse as a child?: No Did patient suffer from severe childhood  neglect?: No Has patient ever been sexually abused/assaulted/raped as an adolescent or adult?: No Was the patient ever a victim of a crime or a disaster?: No Witnessed domestic violence?: No Has patient been affected by domestic violence as an adult?: No       CCA Substance Use Alcohol/Drug Use: Alcohol / Drug Use Pain Medications: See MAR Prescriptions: See MAR Over the Counter: See MAR History of alcohol / drug use?: Yes Longest period of sobriety (when/how long): 1.5 months (when he was in the  hospital) a couple of mos ago. Negative Consequences of Use: Financial, Personal relationships, Work / School Withdrawal Symptoms: None Substance #1 Name of Substance 1: Heroin 1 - Age of First Use: 33 1 - Amount (size/oz): 1/10 gram 1 - Frequency: daily 1 - Duration: current episode since he left [facility] on Tues, 8/5 1 - Last Use / Amount: yesterday - 1/10 g 1 - Method of Aquiring: NA 1- Route of Use: snort, smoke Substance #2 Name of Substance 2: crack cocaine  2 - Age of First Use: 23 2 - Amount (size/oz): $20 2 - Frequency: daily 2 - Duration: since 05/07/24 2 - Method of Aquiring: NA 2 - Route of Substance Use: smoke Substance #3 Name of Substance 3: ETOH 3 - Age of First Use: 16 3 - Amount (size/oz): 4-40 oz beers 3 - Frequency: daily 3 - Duration: since relapsed 05/07/24 3 - Last Use / Amount: yesterday - amt unknown 3 - Method of Aquiring: NA 3 - Route of Substance Use: drinks                   ASAM's:  Six Dimensions of Multidimensional Assessment  Dimension 1:  Acute Intoxication and/or Withdrawal Potential:   Dimension 1:  Description of individual's past and current experiences of substance use and withdrawal: No s/s of withdrawal  Dimension 2:  Biomedical Conditions and Complications:   Dimension 2:  Description of patient's biomedical conditions and  complications: Per chart, pt has hip surgey on 01/31/24 - 02/16/24 for Left hip/femur cellulitis, osteomyelitis  and septic arthritis, pt uses a walker.  Dimension 3:  Emotional, Behavioral, or Cognitive Conditions and Complications:  Dimension 3:  Description of emotional, behavioral, or cognitive conditions and complications: Per chart, pt has the following diagnoses: schizoaffective disorder, depression, Bipolar 1 disorder (HCC), Amphetamine use disorder, severe (HCC), Opioid use disorder. Pt reports, suicidal ideations that are on and off.  Dimension 4:  Readiness to Change:  Dimension 4:  Description of Readiness to Change criteria: Patient seeking detox so he can go with family to the beach next week and be clean - low motivation for sobriety  Dimension 5:  Relapse, Continued use, or Continued Problem Potential:  Dimension 5:  Relapse, continued use, or continued problem potential critiera description: Ongoing SA issues for years - no f/u treatment when recommended  Dimension 6:  Recovery/Living Environment:  Dimension 6:  Recovery/Iiving environment criteria description: Pt reports, he lives with his girlfrined.  ASAM Severity Score: ASAM's Severity Rating Score: 11  ASAM Recommended Level of Treatment: ASAM Recommended Level of Treatment: Level III Residential Treatment   Substance use Disorder (SUD) Substance Use Disorder (SUD)  Checklist Symptoms of Substance Use: Continued use despite having a persistent/recurrent physical/psychological problem caused/exacerbated by use, Continued use despite persistent or recurrent social, interpersonal problems, caused or exacerbated by use, Large amounts of time spent to obtain, use or recover from the substance(s), Persistent desire or unsuccessful efforts to cut down or control use, Repeated use in physically hazardous situations, Social, occupational, recreational activities given up or reduced due to use, Substance(s) often taken in larger amounts or over longer times than was intended  Recommendations for Services/Supports/Treatments: Recommendations for  Services/Supports/Treatments Recommendations For Services/Supports/Treatments: Medication Management, Individual Therapy, Inpatient Hospitalization, Facility Based Crisis  Disposition Recommendation per psychiatric provider: We recommend transfer to [facility]. Admit to [facility]   DSM5 Diagnoses: Patient Active Problem List   Diagnosis Date Noted   Polysubstance use disorder 04/28/2024  Cocaine  use disorder, severe, dependence (HCC) 03/31/2024   Osteomyelitis of left hip (HCC) 02/22/2024   Cellulitis 02/20/2024   Severe sepsis (HCC) 01/31/2024   Hypokalemia 01/31/2024   Chronic alcohol abuse 01/31/2024   Anemia of chronic disease 01/31/2024   Transaminitis 01/31/2024   Bipolar 1 disorder (HCC)    Amphetamine use disorder, 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   Polysubstance dependence (HCC) 12/13/2023   Polysubstance abuse (HCC) 11/21/2023   Alcohol use disorder 08/04/2023   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

## 2024-05-10 NOTE — Group Note (Signed)
 Group Topic: Relapse and Recovery  Group Date: 05/10/2024 Start Time: 2000 End Time: 2100 Facilitators: Joan Plowman B  Department: [facility]  Number of Participants: 7  Group Focus: abuse issues, community group, and coping skills Treatment Modality:  Spiritual Interventions utilized were leisure development and patient education Purpose: express feelings  Name: Mark Hammond Date of Birth: 1990-10-19  MR: [id number]    Level of Participation: PT DID NOT ATTEND GROUP Quality of Participation: cooperative Interactions with others: gave feedback Mood/Affect: appropriate Triggers (if applicable): NA Cognition: coherent/clear Progress: None Response: NA Plan: patient will be encouraged to go to groups.   Patients Problems:  Patient Active Problem List   Diagnosis Date Noted   Schizoaffective disorder, bipolar type (HCC) 05/10/2024   Polysubstance use disorder 04/28/2024   Cocaine  use disorder, severe, dependence (HCC) 03/31/2024   Osteomyelitis of left hip (HCC) 02/22/2024   Cellulitis 02/20/2024   Severe sepsis (HCC) 01/31/2024   Hypokalemia 01/31/2024   Chronic alcohol abuse 01/31/2024   Anemia of chronic disease 01/31/2024   Transaminitis 01/31/2024   Bipolar 1 disorder (HCC)    Amphetamine use disorder, 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   Polysubstance dependence (HCC) 12/13/2023   Polysubstance abuse (HCC) 11/21/2023   Alcohol use disorder 08/04/2023   Stimulant use disorder 08/04/2023   Heroin use disorder, severe (HCC) 08/04/2023   Tobacco use disorder 08/04/2023   Cannabis use disorder 08/04/2023   Hepatitis C 03/01/2023   Anxiety state 01/14/2023   Insomnia 01/14/2023   Schizoaffective disorder (HCC) 01/13/2023   Schizophrenia (HCC) 11/15/2019   Substance induced mood disorder (HCC) 10/24/2019   Methamphetamine use disorder, 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

## 2024-05-10 NOTE — Discharge Instructions (Signed)
Admit to [facility]

## 2024-05-10 NOTE — BHH Group Notes (Signed)
 SPIRITUALITY GROUP NOTE  Spirituality group facilitated by Chaplain Chetara Kropp, MDiv, BCC.  Group Description:  Group focused on topic of hope.  Patients participated in facilitated discussion around topic, connecting with one another around experiences and definitions for hope.  Group members engaged with visual explorer photos, reflecting on what hope looks like for them today.  Group engaged in discussion around how their definitions of hope are present today in hospital.   Modalities: Psycho-social ed, Adlerian, Narrative, MI Patient Progress: DiD not attend

## 2024-05-10 NOTE — ED Provider Notes (Signed)
 [facility] Urgent Care Continuous Assessment Admission H&P  Date: 05/10/24 Patient Name: Mark Hammond MRN: [id number] Chief Complaint: Mainly here for detox from alcohol and narcotics  Diagnoses:  Final diagnoses:  Polysubstance abuse [facility])    HPI: Per triage, [name], presents to [facility] unaccompanied, voluntarily. PT states he has been experiencing withdrawal symptoms and would like to detox. PT states he has been diagnosed with schizophrenia, depression and BPD and doesn't take his medications like he is supposed to. PT admits to using crack cocaine , meth, heroine yesterday, also drinking 1/5 vodka. PT has hx of self inflicting harm (cuts on arm), multiple suicide attempts by OD'ing on fentanyl . PT states a month ago he had SI, no plan. PT states he recently had thoughts of hurting someone yesterday but none of yall here at this facility. PT endores AVH, hearing religious voices from God and [place]. PT denies HI.   Chart reviewed with attending psychiatrist, Dr Mark Hammond.  Mark Hammond is seen face-to-face in the [facility] treatment area. He is alert & oriented x4 and engages in today's evaluation. Today, pt states mainly here for detox from alcohol and narcotics. Patient states he was discharged from [facility] on 8/5 and had a return to use immediately post-discharge. He is unable to identify a trigger for return to use. He states he did not follow thru with outpatient follow up after discharge because I was too busy getting high. He does not have an outpatient psychiatric provider, outpatient therapist or outpatient substance use provider. When asked regarding outpatient support, he again attributes substance use as a barrier to outpatient care. He states he was prescribed Zoloft  and Abilify  during last inpatient hospitalization; states he has not taken either since 8/5 because I been focused on getting high. States his goal for today is to detox before I go to the beach with my family next  week. He denies suicidal ideation, intent or plan. He endorses homicidal ideation without intent or plan. HI is not towards any particular individual. Last HI was yesterday towards an individual who was making fun of him because he was using a walker. He endorses auditory hallucinations of religious voices from up above and down below. He will be admitted to the facility based crisis unit for treatment for polysubstance abuse.   Total Time spent with patient: 20 minutes  Musculoskeletal  Strength & Muscle Tone: within normal limits Gait & Station: use walker to assist with ambulation; gait unsteady w/o walker due to left leg shortening Patient leans: N/A  Psychiatric Specialty Exam  Presentation General Appearance:  Casual  Eye Contact: Good  Speech: Normal Rate  Speech Volume: Normal  Handedness: Right   Mood and Affect  Mood: Labile  Affect: Congruent   Thought Process  Thought Processes: Goal Directed; Coherent  Descriptions of Associations:Intact  Orientation:Partial  Thought Content:WDL  Diagnosis of Schizophrenia or Schizoaffective disorder in past: Yes  Duration of Psychotic Symptoms: Greater than six months  Hallucinations:No data recorded Ideas of Reference:None  Suicidal Thoughts:No data recorded Homicidal Thoughts:No data recorded  Sensorium  Memory: Immediate Fair  Judgment: Poor  Insight: Poor   Executive Functions  Concentration: Good  Attention Span: Good  Recall: Fair  Fund of Knowledge: Fair  Language: Fair   Psychomotor Activity  Psychomotor Activity:No data recorded  Assets  Assets: Communication Skills; Desire for Improvement   Sleep  Sleep:No data recorded  No data recorded  Physical Exam Vitals and nursing note reviewed.  HENT:  Head: Normocephalic.     Mouth/Throat:     Mouth: Mucous membranes are moist.  Cardiovascular:     Rate and Rhythm: Tachycardia present.  Pulmonary:      Effort: Pulmonary effort is normal.  Musculoskeletal:     Cervical back: Normal range of motion.     Comments: Uses walker to assist with ambulation. Left leg shortening. Left hip replacement 02/23/2024  Skin:    General: Skin is warm and dry.  Neurological:     Mental Status: He is alert and oriented to person, place, and time.  Psychiatric:     Comments: See H &P    Review of Systems  Constitutional:  Negative for chills and fever.  HENT:  Negative for congestion and sore throat.   Respiratory:  Negative for cough and shortness of breath.   Cardiovascular:  Negative for chest pain and palpitations.  Gastrointestinal:  Negative for diarrhea, nausea and vomiting.  Psychiatric/Behavioral:  Positive for hallucinations and substance abuse. Negative for suicidal ideas.     Blood pressure 139/85, pulse 100, temperature 99.1 F (37.3 C), temperature source Oral, resp. rate 16, SpO2 99%. There is no height or weight on file to calculate BMI.  Past Psychiatric History: Depression, Bipolar disorder, Schizophrenia, polysubstance use (methamphetamine, cocaine , heroin, alcohol) Hospitalization: [facility], [facility], [facility], [facility]  Medications: Zoloft , Abilify  Outpatient psych provider: None Outpatient therapist: None Outpatient substance use tx: None  Is the patient at risk to self? No  Has the patient been a risk to self in the past 6 months? Yes .    Has the patient been a risk to self within the distant past? Yes   Is the patient a risk to others? No   Has the patient been a risk to others in the past 6 months? No   Has the patient been a risk to others within the distant past? No   Past Medical History:  has a past medical history of Alcoholic gastritis, Bipolar 1 disorder (HCC), Dental abscess, Depression, Drug abuse (HCC), ETOH abuse, Hallucination, Hypertension, Schizo affective schizophrenia (HCC), and Substance abuse (HCC).  Past Surgical History:  Procedure Laterality Date    TOTAL HIP ARTHROPLASTY Left 02/23/2024   Procedure: ARTHROPLASTY, HIP, TOTAL,POSTERIOR APPROACH;  Surgeon: Jerri Kay HERO, MD;  Location: [facility] OR;  Service: Orthopedics;  Laterality: Left;  LEFT POSTERIOR HIP RESECTION ARTHROPLASTY WITH ANTIBIOTIC SPACER     Family History: family history includes Stroke in an other family member.   Social History: currently lives in the [place] with his girlfriend. Unemployed. No [employer]. No legal, no probation or parole. Applied for disability this month.  Substance Use History: Tobacco half ppd and nicotine  pouches (20 pouches in three hours); ETOH last use yesterday; methamphetamine-first use at age 44, use of once or twice/week last month, daily use since 8/5 of tenth of a gram via snort or smoke; heroin-first use at 33, daily use of tenth of a gram via snort or smoke; fentanyl -last use in January, 2025; denies THC; cocaine - first use at age 61, daily use of $20-60/day, last use yesterday.   Last Labs:  Admission on 05/09/2024, Discharged on 05/09/2024  Component Date Value Ref Range Status   Glucose-Capillary 05/09/2024 122 (H)  70 - 99 mg/dL Final   Glucose reference range applies only to samples taken after fasting for at least 8 hours.  Admission on 04/27/2024, Discharged on 04/28/2024  Component Date Value Ref Range Status   WBC 04/27/2024 8.4  4.0 -  10.5 K/uL Final   RBC 04/27/2024 3.91 (L)  4.22 - 5.81 MIL/uL Final   Hemoglobin 04/27/2024 10.3 (L)  13.0 - 17.0 g/dL Final   HCT 92/73/7974 32.3 (L)  39.0 - 52.0 % Final   MCV 04/27/2024 82.6  80.0 - 100.0 fL Final   MCH 04/27/2024 26.3  26.0 - 34.0 pg Final   MCHC 04/27/2024 31.9  30.0 - 36.0 g/dL Final   RDW 92/73/7974 13.5  11.5 - 15.5 % Final   Platelets 04/27/2024 299  150 - 400 K/uL Final   nRBC 04/27/2024 0.0  0.0 - 0.2 % Final   Neutrophils Relative % 04/27/2024 63  % Final   Neutro Abs 04/27/2024 5.4  1.7 - 7.7 K/uL Final   Lymphocytes Relative 04/27/2024 22  % Final   Lymphs Abs  04/27/2024 1.8  0.7 - 4.0 K/uL Final   Monocytes Relative 04/27/2024 13  % Final   Monocytes Absolute 04/27/2024 1.1 (H)  0.1 - 1.0 K/uL Final   Eosinophils Relative 04/27/2024 0  % Final   Eosinophils Absolute 04/27/2024 0.0  0.0 - 0.5 K/uL Final   Basophils Relative 04/27/2024 1  % Final   Basophils Absolute 04/27/2024 0.1  0.0 - 0.1 K/uL Final   Immature Granulocytes 04/27/2024 1  % Final   Abs Immature Granulocytes 04/27/2024 0.04  0.00 - 0.07 K/uL Final   Performed at [facility], [address] [place], [place] [address]   Sodium 04/27/2024 132 (L)  135 - 145 mmol/L Final   Potassium 04/27/2024 3.6  3.5 - 5.1 mmol/L Final   Chloride 04/27/2024 98  98 - 111 mmol/L Final   CO2 04/27/2024 25  22 - 32 mmol/L Final   Glucose, Bld 04/27/2024 128 (H)  70 - 99 mg/dL Final   Glucose reference range applies only to samples taken after fasting for at least 8 hours.   BUN 04/27/2024 13  6 - 20 mg/dL Final   Creatinine, Ser 04/27/2024 0.52 (L)  0.61 - 1.24 mg/dL Final   Calcium 92/73/7974 8.9  8.9 - 10.3 mg/dL Final   GFR, Estimated 04/27/2024 >60  >60 mL/min Final   Comment: (NOTE) Calculated using the CKD-EPI Creatinine Equation (2021)    Anion gap 04/27/2024 9  5 - 15 Final   Performed at [facility], [address], [place], [place] [address]   Alcohol, Ethyl (B) 04/27/2024 <15  <15 mg/dL Final   Comment: (NOTE) For medical purposes only. Performed at [facility], [address], [place], [place] [address]    Opiates 04/27/2024 POSITIVE (A)  NONE DETECTED Final   Cocaine  04/27/2024 POSITIVE (A)  NONE DETECTED Final   Benzodiazepines 04/27/2024 NONE DETECTED  NONE DETECTED Final   Amphetamines 04/27/2024 NONE DETECTED  NONE DETECTED Final   Tetrahydrocannabinol 04/27/2024 POSITIVE (A)  NONE DETECTED Final   Barbiturates 04/27/2024 NONE DETECTED  NONE DETECTED Final   Comment: (NOTE) DRUG SCREEN FOR MEDICAL PURPOSES ONLY.   IF CONFIRMATION IS NEEDED FOR ANY PURPOSE, NOTIFY LAB WITHIN 5 DAYS.  LOWEST DETECTABLE LIMITS FOR URINE DRUG SCREEN Drug Class                     Cutoff (ng/mL) Amphetamine and metabolites    1000 Barbiturate and metabolites    200 Benzodiazepine                 200 Opiates and metabolites        300  Cocaine  and metabolites        300 THC                            50 Performed at [facility], [address], [place], [place] [address]   Admission on 04/09/2024, Discharged on 04/10/2024  Component Date Value Ref Range Status   Sodium 04/09/2024 136  135 - 145 mmol/L Final   Potassium 04/09/2024 3.2 (L)  3.5 - 5.1 mmol/L Final   Chloride 04/09/2024 100  98 - 111 mmol/L Final   CO2 04/09/2024 22  22 - 32 mmol/L Final   Glucose, Bld 04/09/2024 94  70 - 99 mg/dL Final   Glucose reference range applies only to samples taken after fasting for at least 8 hours.   BUN 04/09/2024 <5 (L)  6 - 20 mg/dL Final   Creatinine, Ser 04/09/2024 0.59 (L)  0.61 - 1.24 mg/dL Final   Calcium 92/91/7974 8.7 (L)  8.9 - 10.3 mg/dL Final   Total Protein 92/91/7974 8.5 (H)  6.5 - 8.1 g/dL Final   Albumin  04/09/2024 4.0  3.5 - 5.0 g/dL Final   AST 92/91/7974 78 (H)  15 - 41 U/L Final   ALT 04/09/2024 33  0 - 44 U/L Final   Alkaline Phosphatase 04/09/2024 120  38 - 126 U/L Final   Total Bilirubin 04/09/2024 0.7  0.0 - 1.2 mg/dL Final   GFR, Estimated 04/09/2024 >60  >60 mL/min Final   Comment: (NOTE) Calculated using the CKD-EPI Creatinine Equation (2021)    Anion gap 04/09/2024 14  5 - 15 Final   Performed at [facility], [address], [place], [place] [address]   Alcohol, Ethyl (B) 04/09/2024 125 (H)  <15 mg/dL Final   Comment: (NOTE) For medical purposes only. Performed at [facility], [address], [place], [place] [address]    Opiates 04/09/2024 NONE DETECTED  NONE DETECTED Final   Cocaine  04/09/2024 POSITIVE (A)  NONE DETECTED  Final   Benzodiazepines 04/09/2024 POSITIVE (A)  NONE DETECTED Final   Amphetamines 04/09/2024 POSITIVE (A)  NONE DETECTED Final   Comment: (NOTE) Trazodone  is metabolized in vivo to several metabolites, including pharmacologically active m-CPP, which is excreted in the urine. Immunoassay screens for amphetamines and MDMA have potential cross-reactivity with these compounds and may provide false positive  results.     Tetrahydrocannabinol 04/09/2024 NONE DETECTED  NONE DETECTED Final   Barbiturates 04/09/2024 NONE DETECTED  NONE DETECTED Final   Comment: (NOTE) DRUG SCREEN FOR MEDICAL PURPOSES ONLY.  IF CONFIRMATION IS NEEDED FOR ANY PURPOSE, NOTIFY LAB WITHIN 5 DAYS.  LOWEST DETECTABLE LIMITS FOR URINE DRUG SCREEN Drug Class                     Cutoff (ng/mL) Amphetamine and metabolites    1000 Barbiturate and metabolites    200 Benzodiazepine                 200 Opiates and metabolites        300 Cocaine  and metabolites        300 THC                            50 Performed at [facility], [address] [place], [place] [address]    WBC 04/09/2024 8.1  4.0 - 10.5 K/uL Final  RBC 04/09/2024 4.07 (L)  4.22 - 5.81 MIL/uL Final   Hemoglobin 04/09/2024 11.2 (L)  13.0 - 17.0 g/dL Final   HCT 92/91/7974 34.8 (L)  39.0 - 52.0 % Final   MCV 04/09/2024 85.5  80.0 - 100.0 fL Final   MCH 04/09/2024 27.5  26.0 - 34.0 pg Final   MCHC 04/09/2024 32.2  30.0 - 36.0 g/dL Final   RDW 92/91/7974 13.2  11.5 - 15.5 % Final   Platelets 04/09/2024 411 (H)  150 - 400 K/uL Final   nRBC 04/09/2024 0.0  0.0 - 0.2 % Final   Neutrophils Relative % 04/09/2024 60  % Final   Neutro Abs 04/09/2024 4.9  1.7 - 7.7 K/uL Final   Lymphocytes Relative 04/09/2024 30  % Final   Lymphs Abs 04/09/2024 2.4  0.7 - 4.0 K/uL Final   Monocytes Relative 04/09/2024 7  % Final   Monocytes Absolute 04/09/2024 0.6  0.1 - 1.0 K/uL Final   Eosinophils Relative 04/09/2024 1  % Final   Eosinophils  Absolute 04/09/2024 0.1  0.0 - 0.5 K/uL Final   Basophils Relative 04/09/2024 2  % Final   Basophils Absolute 04/09/2024 0.1  0.0 - 0.1 K/uL Final   Immature Granulocytes 04/09/2024 0  % Final   Abs Immature Granulocytes 04/09/2024 0.02  0.00 - 0.07 K/uL Final   Performed at [facility], [address], [place], [place] [address]  Admission on 04/08/2024, Discharged on 04/08/2024  Component Date Value Ref Range Status   Sodium 04/08/2024 136  135 - 145 mmol/L Final   Potassium 04/08/2024 3.3 (L)  3.5 - 5.1 mmol/L Final   Chloride 04/08/2024 97 (L)  98 - 111 mmol/L Final   CO2 04/08/2024 22  22 - 32 mmol/L Final   Glucose, Bld 04/08/2024 96  70 - 99 mg/dL Final   Glucose reference range applies only to samples taken after fasting for at least 8 hours.   BUN 04/08/2024 5 (L)  6 - 20 mg/dL Final   Creatinine, Ser 04/08/2024 0.61  0.61 - 1.24 mg/dL Final   Calcium 92/92/7974 8.6 (L)  8.9 - 10.3 mg/dL Final   Total Protein 92/92/7974 7.9  6.5 - 8.1 g/dL Final   Albumin  04/08/2024 3.6  3.5 - 5.0 g/dL Final   AST 92/92/7974 71 (H)  15 - 41 U/L Final   ALT 04/08/2024 30  0 - 44 U/L Final   Alkaline Phosphatase 04/08/2024 100  38 - 126 U/L Final   Total Bilirubin 04/08/2024 0.5  0.0 - 1.2 mg/dL Final   GFR, Estimated 04/08/2024 >60  >60 mL/min Final   Comment: (NOTE) Calculated using the CKD-EPI Creatinine Equation (2021)    Anion gap 04/08/2024 17 (H)  5 - 15 Final   Performed at [facility], [address] [place], [place] [address]   Alcohol, Ethyl (B) 04/08/2024 100 (H)  <15 mg/dL Final   Comment: (NOTE) For medical purposes only. Performed at [facility], [address] [place], [place] [address]    WBC 04/08/2024 6.8  4.0 - 10.5 K/uL Final   RBC 04/08/2024 4.14 (L)  4.22 - 5.81 MIL/uL Final   Hemoglobin 04/08/2024 11.6 (L)  13.0 - 17.0 g/dL Final   HCT 92/92/7974 35.0 (L)  39.0 - 52.0 % Final   MCV 04/08/2024 84.5  80.0 - 100.0 fL Final   MCH  04/08/2024 28.0  26.0 - 34.0 pg Final   MCHC 04/08/2024 33.1  30.0 -  36.0 g/dL Final   RDW 92/92/7974 13.2  11.5 - 15.5 % Final   Platelets 04/08/2024 407 (H)  150 - 400 K/uL Final   nRBC 04/08/2024 0.0  0.0 - 0.2 % Final   Performed at [facility], [address], [place], [place] [address]   Opiates 04/08/2024 NONE DETECTED  NONE DETECTED Final   Cocaine  04/08/2024 POSITIVE (A)  NONE DETECTED Final   Benzodiazepines 04/08/2024 POSITIVE (A)  NONE DETECTED Final   Amphetamines 04/08/2024 POSITIVE (A)  NONE DETECTED Final   Comment: (NOTE) Trazodone  is metabolized in vivo to several metabolites, including pharmacologically active m-CPP, which is excreted in the urine. Immunoassay screens for amphetamines and MDMA have potential cross-reactivity with these compounds and may provide false positive  results.     Tetrahydrocannabinol 04/08/2024 NONE DETECTED  NONE DETECTED Final   Barbiturates 04/08/2024 NONE DETECTED  NONE DETECTED Final   Comment: (NOTE) DRUG SCREEN FOR MEDICAL PURPOSES ONLY.  IF CONFIRMATION IS NEEDED FOR ANY PURPOSE, NOTIFY LAB WITHIN 5 DAYS.  LOWEST DETECTABLE LIMITS FOR URINE DRUG SCREEN Drug Class                     Cutoff (ng/mL) Amphetamine and metabolites    1000 Barbiturate and metabolites    200 Benzodiazepine                 200 Opiates and metabolites        300 Cocaine  and metabolites        300 THC                            50 Performed at [facility], [address], [place], [place] [address]    Acetaminophen  (Tylenol ), Serum 04/08/2024 <10 (L)  10 - 30 ug/mL Final   Comment: (NOTE) Therapeutic concentrations vary significantly. A range of 10-30 ug/mL  may be an effective concentration for many patients. However, some  are best treated at concentrations outside of this range. Acetaminophen  concentrations >150 ug/mL at 4 hours after ingestion  and >50 ug/mL at 12 hours after ingestion are often associated with  toxic  reactions.  Performed at [facility], [address], [place], [place] [address]    Salicylate Lvl 04/08/2024 <7.0 (L)  7.0 - 30.0 mg/dL Final   Performed at [facility], [address] [place], [place] [address]  Admission on 04/03/2024, Discharged on 04/03/2024  Component Date Value Ref Range Status   Sodium 04/03/2024 134 (L)  135 - 145 mmol/L Final   Potassium 04/03/2024 3.7  3.5 - 5.1 mmol/L Final   Chloride 04/03/2024 98  98 - 111 mmol/L Final   CO2 04/03/2024 24  22 - 32 mmol/L Final   Glucose, Bld 04/03/2024 94  70 - 99 mg/dL Final   Glucose reference range applies only to samples taken after fasting for at least 8 hours.   BUN 04/03/2024 6  6 - 20 mg/dL Final   Creatinine, Ser 04/03/2024 0.53 (L)  0.61 - 1.24 mg/dL Final   Calcium 92/97/7974 8.9  8.9 - 10.3 mg/dL Final   Total Protein 92/97/7974 8.7 (H)  6.5 - 8.1 g/dL Final   Albumin  04/03/2024 3.8  3.5 - 5.0 g/dL Final   AST 92/97/7974 52 (H)  15 - 41 U/L Final   ALT 04/03/2024 25  0 - 44 U/L Final   Alkaline Phosphatase 04/03/2024 114  38 - 126 U/L Final   Total Bilirubin 04/03/2024 0.6  0.0 - 1.2 mg/dL Final   GFR, Estimated 04/03/2024 >60  >60 mL/min Final   Comment: (NOTE) Calculated using the CKD-EPI Creatinine Equation (2021)    Anion gap 04/03/2024 12  5 - 15 Final   Performed at [facility], [address] [place], [place] [address]   WBC 04/03/2024 7.8  4.0 - 10.5 K/uL Final   RBC 04/03/2024 3.97 (L)  4.22 - 5.81 MIL/uL Final   Hemoglobin 04/03/2024 11.1 (L)  13.0 - 17.0 g/dL Final   HCT 92/97/7974 34.8 (L)  39.0 - 52.0 % Final   MCV 04/03/2024 87.7  80.0 - 100.0 fL Final   MCH 04/03/2024 28.0  26.0 - 34.0 pg Final   MCHC 04/03/2024 31.9  30.0 - 36.0 g/dL Final   RDW 92/97/7974 13.4  11.5 - 15.5 % Final   Platelets 04/03/2024 356  150 - 400 K/uL Final   nRBC 04/03/2024 0.0  0.0 - 0.2 % Final   Neutrophils Relative % 04/03/2024 54  % Final   Neutro Abs 04/03/2024 4.1  1.7  - 7.7 K/uL Final   Lymphocytes Relative 04/03/2024 34  % Final   Lymphs Abs 04/03/2024 2.6  0.7 - 4.0 K/uL Final   Monocytes Relative 04/03/2024 11  % Final   Monocytes Absolute 04/03/2024 0.9  0.1 - 1.0 K/uL Final   Eosinophils Relative 04/03/2024 0  % Final   Eosinophils Absolute 04/03/2024 0.0  0.0 - 0.5 K/uL Final   Basophils Relative 04/03/2024 1  % Final   Basophils Absolute 04/03/2024 0.1  0.0 - 0.1 K/uL Final   Immature Granulocytes 04/03/2024 0  % Final   Abs Immature Granulocytes 04/03/2024 0.02  0.00 - 0.07 K/uL Final   Performed at [facility], [address] [place], [place] [address]   Sed Rate 04/03/2024 30 (H)  0 - 16 mm/hr Final   Performed at [facility], [address] [place], [place] [address]   CRP 04/03/2024 1.3 (H)  <1.0 mg/dL Final   Performed at [facility], [address], [place], [place] [address]  Admission on 03/30/2024, Discharged on 04/01/2024  Component Date Value Ref Range Status   Glucose-Capillary 03/30/2024 107 (H)  70 - 99 mg/dL Final   Glucose reference range applies only to samples taken after fasting for at least 8 hours.   Sodium 03/30/2024 142  135 - 145 mmol/L Final   Potassium 03/30/2024 3.3 (L)  3.5 - 5.1 mmol/L Final   Chloride 03/30/2024 106  98 - 111 mmol/L Final   CO2 03/30/2024 23  22 - 32 mmol/L Final   Glucose, Bld 03/30/2024 107 (H)  70 - 99 mg/dL Final   Glucose reference range applies only to samples taken after fasting for at least 8 hours.   BUN 03/30/2024 5 (L)  6 - 20 mg/dL Final   Creatinine, Ser 03/30/2024 0.58 (L)  0.61 - 1.24 mg/dL Final   Calcium 93/71/7974 8.1 (L)  8.9 - 10.3 mg/dL Final   Total Protein 93/71/7974 7.4  6.5 - 8.1 g/dL Final   Albumin  03/30/2024 3.5  3.5 - 5.0 g/dL Final   AST 93/71/7974 53 (H)  15 - 41 U/L Final   ALT 03/30/2024 25  0 - 44 U/L Final   Alkaline Phosphatase 03/30/2024 103  38 - 126 U/L Final   Total Bilirubin 03/30/2024 0.4  0.0 - 1.2  mg/dL  Final   GFR, Estimated 03/30/2024 >60  >60 mL/min Final   Comment: (NOTE) Calculated using the CKD-EPI Creatinine Equation (2021)    Anion gap 03/30/2024 13  5 - 15 Final   Performed at [facility], [address], [place], [place] [address]   Salicylate Lvl 03/30/2024 <7.0 (L)  7.0 - 30.0 mg/dL Final   Performed at [facility], [address] [place], [place] [address]   Acetaminophen  (Tylenol ), Serum 03/30/2024 <10 (L)  10 - 30 ug/mL Final   Comment: (NOTE) Therapeutic concentrations vary significantly. A range of 10-30 ug/mL  may be an effective concentration for many patients. However, some  are best treated at concentrations outside of this range. Acetaminophen  concentrations >150 ug/mL at 4 hours after ingestion  and >50 ug/mL at 12 hours after ingestion are often associated with  toxic reactions.  Performed at [facility], [address], [place], [place] [address]    Alcohol, Ethyl (B) 03/30/2024 243 (H)  <15 mg/dL Final   Comment: (NOTE) For medical purposes only. Performed at [facility], [address], [place], [place] [address]    Opiates 03/30/2024 NONE DETECTED  NONE DETECTED Final   Cocaine  03/30/2024 POSITIVE (A)  NONE DETECTED Final   Benzodiazepines 03/30/2024 POSITIVE (A)  NONE DETECTED Final   Amphetamines 03/30/2024 NONE DETECTED  NONE DETECTED Final   Tetrahydrocannabinol 03/30/2024 NONE DETECTED  NONE DETECTED Final   Barbiturates 03/30/2024 NONE DETECTED  NONE DETECTED Final   Comment: (NOTE) DRUG SCREEN FOR MEDICAL PURPOSES ONLY.  IF CONFIRMATION IS NEEDED FOR ANY PURPOSE, NOTIFY LAB WITHIN 5 DAYS.  LOWEST DETECTABLE LIMITS FOR URINE DRUG SCREEN Drug Class                     Cutoff (ng/mL) Amphetamine and metabolites    1000 Barbiturate and metabolites    200 Benzodiazepine                 200 Opiates and metabolites        300 Cocaine  and metabolites        300 THC                            50 Performed at  [facility], [address] [place], [place] [address]    WBC 03/30/2024 6.0  4.0 - 10.5 K/uL Final   RBC 03/30/2024 3.66 (L)  4.22 - 5.81 MIL/uL Final   Hemoglobin 03/30/2024 10.6 (L)  13.0 - 17.0 g/dL Final   HCT 93/71/7974 32.1 (L)  39.0 - 52.0 % Final   MCV 03/30/2024 87.7  80.0 - 100.0 fL Final   MCH 03/30/2024 29.0  26.0 - 34.0 pg Final   MCHC 03/30/2024 33.0  30.0 - 36.0 g/dL Final   RDW 93/71/7974 13.2  11.5 - 15.5 % Final   Platelets 03/30/2024 307  150 - 400 K/uL Final   nRBC 03/30/2024 0.0  0.0 - 0.2 % Final   Neutrophils Relative % 03/30/2024 57  % Final   Neutro Abs 03/30/2024 3.5  1.7 - 7.7 K/uL Final   Lymphocytes Relative 03/30/2024 33  % Final   Lymphs Abs 03/30/2024 2.0  0.7 - 4.0 K/uL Final   Monocytes Relative 03/30/2024 7  % Final   Monocytes Absolute 03/30/2024 0.4  0.1 - 1.0 K/uL Final   Eosinophils Relative 03/30/2024 1  % Final  Eosinophils Absolute 03/30/2024 0.1  0.0 - 0.5 K/uL Final   Basophils Relative 03/30/2024 1  % Final   Basophils Absolute 03/30/2024 0.1  0.0 - 0.1 K/uL Final   Immature Granulocytes 03/30/2024 1  % Final   Abs Immature Granulocytes 03/30/2024 0.03  0.00 - 0.07 K/uL Final   Performed at [facility], [address], [place], [place] [address]  Admission on 03/29/2024, Discharged on 03/29/2024  Component Date Value Ref Range Status   Sodium 03/29/2024 140  135 - 145 mmol/L Final   Potassium 03/29/2024 3.2 (L)  3.5 - 5.1 mmol/L Final   Chloride 03/29/2024 103  98 - 111 mmol/L Final   CO2 03/29/2024 27  22 - 32 mmol/L Final   Glucose, Bld 03/29/2024 101 (H)  70 - 99 mg/dL Final   Glucose reference range applies only to samples taken after fasting for at least 8 hours.   BUN 03/29/2024 8  6 - 20 mg/dL Final   Creatinine, Ser 03/29/2024 0.55 (L)  0.61 - 1.24 mg/dL Final   Calcium 93/72/7974 8.4 (L)  8.9 - 10.3 mg/dL Final   Total Protein 93/72/7974 7.0  6.5 - 8.1 g/dL Final   Albumin  03/29/2024 3.3 (L)  3.5 - 5.0 g/dL  Final   AST 93/72/7974 43 (H)  15 - 41 U/L Final   ALT 03/29/2024 20  0 - 44 U/L Final   Alkaline Phosphatase 03/29/2024 107  38 - 126 U/L Final   Total Bilirubin 03/29/2024 0.5  0.0 - 1.2 mg/dL Final   GFR, Estimated 03/29/2024 >60  >60 mL/min Final   Comment: (NOTE) Calculated using the CKD-EPI Creatinine Equation (2021)    Anion gap 03/29/2024 10  5 - 15 Final   Performed at [facility], [address], [place], [place] [address]   Opiates 03/29/2024 NONE DETECTED  NONE DETECTED Final   Cocaine  03/29/2024 POSITIVE (A)  NONE DETECTED Final   Benzodiazepines 03/29/2024 POSITIVE (A)  NONE DETECTED Final   Amphetamines 03/29/2024 NONE DETECTED  NONE DETECTED Final   Tetrahydrocannabinol 03/29/2024 NONE DETECTED  NONE DETECTED Final   Barbiturates 03/29/2024 NONE DETECTED  NONE DETECTED Final   Comment: (NOTE) DRUG SCREEN FOR MEDICAL PURPOSES ONLY.  IF CONFIRMATION IS NEEDED FOR ANY PURPOSE, NOTIFY LAB WITHIN 5 DAYS.  LOWEST DETECTABLE LIMITS FOR URINE DRUG SCREEN Drug Class                     Cutoff (ng/mL) Amphetamine and metabolites    1000 Barbiturate and metabolites    200 Benzodiazepine                 200 Opiates and metabolites        300 Cocaine  and metabolites        300 THC                            50 Performed at [facility], [address], [place], [place] [address]    WBC 03/29/2024 3.4 (L)  4.0 - 10.5 K/uL Final   RBC 03/29/2024 3.37 (L)  4.22 - 5.81 MIL/uL Final   Hemoglobin 03/29/2024 9.7 (L)  13.0 - 17.0 g/dL Final   HCT 93/72/7974 30.5 (L)  39.0 - 52.0 % Final   MCV 03/29/2024 90.5  80.0 - 100.0 fL Final   MCH 03/29/2024 28.8  26.0 - 34.0 pg Final   MCHC 03/29/2024 31.8  30.0 - 36.0 g/dL Final   RDW 93/72/7974 13.1  11.5 - 15.5 % Final   Platelets 03/29/2024 204  150 - 400 K/uL Final   nRBC 03/29/2024 0.0  0.0 - 0.2 % Final   Neutrophils Relative % 03/29/2024 43  % Final   Neutro Abs 03/29/2024 1.5 (L)  1.7 -  7.7 K/uL Final   Lymphocytes Relative 03/29/2024 43  % Final   Lymphs Abs 03/29/2024 1.5  0.7 - 4.0 K/uL Final   Monocytes Relative 03/29/2024 12  % Final   Monocytes Absolute 03/29/2024 0.4  0.1 - 1.0 K/uL Final   Eosinophils Relative 03/29/2024 1  % Final   Eosinophils Absolute 03/29/2024 0.0  0.0 - 0.5 K/uL Final   Basophils Relative 03/29/2024 1  % Final   Basophils Absolute 03/29/2024 0.0  0.0 - 0.1 K/uL Final   Immature Granulocytes 03/29/2024 0  % Final   Abs Immature Granulocytes 03/29/2024 0.01  0.00 - 0.07 K/uL Final   Performed at [facility], [address], [place], [place] [address]   Alcohol, Ethyl (B) 03/29/2024 <15  <15 mg/dL Final   Comment: (NOTE) For medical purposes only. Performed at [facility], [address] [place], [place] [address]   Admission on 03/27/2024, Discharged on 03/28/2024  Component Date Value Ref Range Status   Glucose-Capillary 03/28/2024 131 (H)  70 - 99 mg/dL Final   Glucose reference range applies only to samples taken after fasting for at least 8 hours.   Sodium 03/27/2024 137  135 - 145 mmol/L Final   Potassium 03/27/2024 3.2 (L)  3.5 - 5.1 mmol/L Final   Chloride 03/27/2024 101  98 - 111 mmol/L Final   CO2 03/27/2024 24  22 - 32 mmol/L Final   Glucose, Bld 03/27/2024 114 (H)  70 - 99 mg/dL Final   Glucose reference range applies only to samples taken after fasting for at least 8 hours.   BUN 03/27/2024 <5 (L)  6 - 20 mg/dL Final   Creatinine, Ser 03/27/2024 0.62  0.61 - 1.24 mg/dL Final   Calcium 93/74/7974 8.5 (L)  8.9 - 10.3 mg/dL Final   Total Protein 93/74/7974 8.6 (H)  6.5 - 8.1 g/dL Final   Albumin  03/27/2024 4.1  3.5 - 5.0 g/dL Final   AST 93/74/7974 43 (H)  15 - 41 U/L Final   ALT 03/27/2024 22  0 - 44 U/L Final   Alkaline Phosphatase 03/27/2024 122  38 - 126 U/L Final   Total Bilirubin 03/27/2024 0.7  0.0 - 1.2 mg/dL Final   GFR, Estimated 03/27/2024 >60  >60 mL/min Final   Comment:  (NOTE) Calculated using the CKD-EPI Creatinine Equation (2021)    Anion gap 03/27/2024 12  5 - 15 Final   Performed at [facility], [address] [place], [place] [address]   Salicylate Lvl 03/27/2024 <7.0 (L)  7.0 - 30.0 mg/dL Final   Performed at [facility], [address], [place], [place] [address]   Acetaminophen  (Tylenol ), Serum 03/27/2024 <10 (L)  10 - 30 ug/mL Final   Comment: (NOTE) Therapeutic concentrations vary significantly. A range of 10-30 ug/mL  may be an effective concentration for many patients. However, some  are best treated at concentrations outside of this range. Acetaminophen  concentrations >150 ug/mL at 4 hours after ingestion  and >50 ug/mL at 12 hours after ingestion are often associated with  toxic reactions.  Performed at [facility], [address], [place], [place] [address]    Alcohol, Ethyl (B) 03/27/2024 146 (H)  <15 mg/dL Final   Comment: (NOTE) For medical purposes only. Performed at [facility], [address] [place], [place] [address]    WBC 03/27/2024 8.0  4.0 - 10.5 K/uL Final   RBC 03/27/2024 3.83 (L)  4.22 - 5.81 MIL/uL Final   Hemoglobin 03/27/2024 10.9 (L)  13.0 - 17.0 g/dL Final   HCT 93/74/7974 34.2 (L)  39.0 - 52.0 % Final   MCV 03/27/2024 89.3  80.0 - 100.0 fL Final   MCH 03/27/2024 28.5  26.0 - 34.0 pg Final   MCHC 03/27/2024 31.9  30.0 - 36.0 g/dL Final   RDW 93/74/7974 13.2  11.5 - 15.5 % Final   Platelets 03/27/2024 318  150 - 400 K/uL Final   nRBC 03/27/2024 0.0  0.0 - 0.2 % Final   Neutrophils Relative % 03/27/2024 73  % Final   Neutro Abs 03/27/2024 5.8  1.7 - 7.7 K/uL Final   Lymphocytes Relative 03/27/2024 20  % Final   Lymphs Abs 03/27/2024 1.6  0.7 - 4.0 K/uL Final   Monocytes Relative 03/27/2024 6  % Final   Monocytes Absolute 03/27/2024 0.5  0.1 - 1.0 K/uL Final   Eosinophils Relative 03/27/2024 0  % Final   Eosinophils Absolute  03/27/2024 0.0  0.0 - 0.5 K/uL Final   Basophils Relative 03/27/2024 1  % Final   Basophils Absolute 03/27/2024 0.1  0.0 - 0.1 K/uL Final   Immature Granulocytes 03/27/2024 0  % Final   Abs Immature Granulocytes 03/27/2024 0.03  0.00 - 0.07 K/uL Final   Performed at [facility], [address], [place], [place] [address]  Admission on 03/27/2024, Discharged on 03/27/2024  Component Date Value Ref Range Status   Sodium 03/27/2024 135  135 - 145 mmol/L Final   Potassium 03/27/2024 3.4 (L)  3.5 - 5.1 mmol/L Final   Chloride 03/27/2024 101  98 - 111 mmol/L Final   CO2 03/27/2024 20 (L)  22 - 32 mmol/L Final   Glucose, Bld 03/27/2024 161 (H)  70 - 99 mg/dL Final   Glucose reference range applies only to samples taken after fasting for at least 8 hours.   BUN 03/27/2024 9  6 - 20 mg/dL Final   Creatinine, Ser 03/27/2024 0.84  0.61 - 1.24 mg/dL Final   Calcium 93/74/7974 9.0  8.9 - 10.3 mg/dL Final   Total Protein 93/74/7974 8.6 (H)  6.5 - 8.1 g/dL Final   Albumin  03/27/2024 4.0  3.5 - 5.0 g/dL Final   AST 93/74/7974 46 (H)  15 - 41 U/L Final   ALT 03/27/2024 23  0 - 44 U/L Final   Alkaline Phosphatase 03/27/2024 114  38 - 126 U/L Final   Total Bilirubin 03/27/2024 0.4  0.0 - 1.2 mg/dL Final   GFR, Estimated 03/27/2024 >60  >60 mL/min Final   Comment: (NOTE) Calculated using the CKD-EPI Creatinine Equation (2021)    Anion gap 03/27/2024 14  5 - 15 Final   Performed at [facility], [address], [place], [place] [address]   Alcohol, Ethyl (B) 03/27/2024 48 (H)  <15 mg/dL Final   Comment: (NOTE) For medical purposes only. Performed at [facility], [address] [place], [place] [address]    WBC 03/27/2024 8.9  4.0 - 10.5 K/uL Final   RBC 03/27/2024 3.95 (L)  4.22 - 5.81 MIL/uL Final   Hemoglobin  03/27/2024 11.0 (L)  13.0 - 17.0 g/dL Final   HCT 93/74/7974 35.0 (L)  39.0 - 52.0 % Final   MCV 03/27/2024 88.6  80.0 - 100.0 fL Final   MCH 03/27/2024  27.8  26.0 - 34.0 pg Final   MCHC 03/27/2024 31.4  30.0 - 36.0 g/dL Final   RDW 93/74/7974 13.2  11.5 - 15.5 % Final   Platelets 03/27/2024 346  150 - 400 K/uL Final   nRBC 03/27/2024 0.0  0.0 - 0.2 % Final   Performed at [facility], [address] [place], [place] [address]   Opiates 03/27/2024 POSITIVE (A)  NONE DETECTED Final   Cocaine  03/27/2024 POSITIVE (A)  NONE DETECTED Final   Benzodiazepines 03/27/2024 POSITIVE (A)  NONE DETECTED Final   Amphetamines 03/27/2024 NONE DETECTED  NONE DETECTED Final   Tetrahydrocannabinol 03/27/2024 NONE DETECTED  NONE DETECTED Final   Barbiturates 03/27/2024 NONE DETECTED  NONE DETECTED Final   Comment: (NOTE) DRUG SCREEN FOR MEDICAL PURPOSES ONLY.  IF CONFIRMATION IS NEEDED FOR ANY PURPOSE, NOTIFY LAB WITHIN 5 DAYS.  LOWEST DETECTABLE LIMITS FOR URINE DRUG SCREEN Drug Class                     Cutoff (ng/mL) Amphetamine and metabolites    1000 Barbiturate and metabolites    200 Benzodiazepine                 200 Opiates and metabolites        300 Cocaine  and metabolites        300 THC                            50 Performed at [facility], [address], [place], [place] [address]    Salicylate Lvl 03/27/2024 <7.0 (L)  7.0 - 30.0 mg/dL Final   Performed at [facility], [address] [place], [place] [address]   Acetaminophen  (Tylenol ), Serum 03/27/2024 <10 (L)  10 - 30 ug/mL Final   Comment: (NOTE) Therapeutic concentrations vary significantly. A range of 10-30 ug/mL  may be an effective concentration for many patients. However, some  are best treated at concentrations outside of this range. Acetaminophen  concentrations >150 ug/mL at 4 hours after ingestion  and >50 ug/mL at 12 hours after ingestion are often associated with  toxic reactions.  Performed at [facility], [address] [place], [place] [address]    Glucose-Capillary 03/27/2024 160 (H)  70 - 99 mg/dL Final   Glucose reference range  applies only to samples taken after fasting for at least 8 hours.   Lipase 03/27/2024 27  11 - 51 U/L Final   Performed at [facility], [address] [place], [place] [address]  No results displayed because visit has over 200 results.    There may be more visits with results that are not included.    Allergies: Fish allergy, Shellfish allergy, and Codeine  Medications:  Facility Ordered Medications  Medication   acetaminophen  (TYLENOL ) tablet 650 mg   alum & mag hydroxide-simeth (MAALOX/MYLANTA) 200-200-20 MG/5ML suspension 30 mL   magnesium  hydroxide (MILK OF MAGNESIA) suspension 30 mL   haloperidol  (HALDOL ) tablet 5 mg   And   diphenhydrAMINE  (BENADRYL ) capsule 50 mg   haloperidol  lactate (HALDOL ) injection 5 mg   And   diphenhydrAMINE  (BENADRYL ) injection 50 mg   And   LORazepam  (ATIVAN )  injection 2 mg   haloperidol  lactate (HALDOL ) injection 10 mg   And   diphenhydrAMINE  (BENADRYL ) injection 50 mg   And   LORazepam  (ATIVAN ) injection 2 mg   PTA Medications  Medication Sig   QUEtiapine  (SEROQUEL ) 50 MG tablet Take 50 mg by mouth at bedtime.   sertraline  (ZOLOFT ) 50 MG tablet Take 50 mg by mouth daily.   ARIPiprazole  (ABILIFY ) 5 MG tablet Take 5 mg by mouth daily.   ondansetron  (ZOFRAN -ODT) 4 MG disintegrating tablet Dissolve 1 tablet (4 mg total) by mouth every 8 (eight) hours as needed for nausea or vomiting.      Medical Decision Making  Lab Orders         CBC with Differential/Platelet         Comprehensive metabolic panel         Ethanol         Lipid panel         POCT Urine Drug Screen - (I-Screen)        Recommendations  Based on my evaluation the patient does not appear to have an emergency medical condition.  Admit to [facility]  Sherrell Culver, PMHNP-BC, FNP-BC  05/10/24  8:55 AM

## 2024-05-11 DIAGNOSIS — F1120 Opioid dependence, uncomplicated: Secondary | ICD-10-CM | POA: Diagnosis not present

## 2024-05-11 DIAGNOSIS — F1010 Alcohol abuse, uncomplicated: Secondary | ICD-10-CM | POA: Diagnosis not present

## 2024-05-11 DIAGNOSIS — F259 Schizoaffective disorder, unspecified: Secondary | ICD-10-CM | POA: Diagnosis not present

## 2024-05-11 LAB — HEPATITIS PANEL, ACUTE
HCV Ab: REACTIVE — AB
Hep A IgM: NONREACTIVE
Hep B C IgM: NONREACTIVE
Hepatitis B Surface Ag: NONREACTIVE

## 2024-05-11 LAB — HIV ANTIBODY (ROUTINE TESTING W REFLEX): HIV Screen 4th Generation wRfx: NONREACTIVE

## 2024-05-11 LAB — RPR: RPR Ser Ql: NONREACTIVE

## 2024-05-11 LAB — TSH: TSH: 0.752 u[IU]/mL (ref 0.350–4.500)

## 2024-05-11 MED ORDER — SERTRALINE HCL 50 MG PO TABS
50.0000 mg | ORAL_TABLET | Freq: Every day | ORAL | Status: DC
  Administered 2024-05-12: 50 mg via ORAL
  Filled 2024-05-11: qty 1

## 2024-05-11 NOTE — ED Notes (Signed)
 Patient resting quietly in bed with eyes closed. Unlabored breathing and rise and fall of chest observed. Q 15 min safety checks in place.

## 2024-05-11 NOTE — Group Note (Signed)
 Group Topic: Overcoming Obstacles  Group Date: 05/11/2024 Start Time: 1630 End Time: 1700 Facilitators: Myra Curtistine BROCKS, RN  Department: [facility]  Number of Participants: 3  Group Focus: daily focus, personal responsibility, and self-awareness Treatment Modality:  Psychoeducation Interventions utilized were other worksheets and patient education Purpose: increase insight  Name: Mark Hammond Date of Birth: 08/05/1991  MR: [id number]    Level of Participation:    Pt did not attend group. Quality of Participation:  Interactions with others:  Mood/Affect:  Triggers (if applicable):  Cognition:  Progress:  Response:  Plan: follow-up needed  Patients Problems:  Patient Active Problem List   Diagnosis Date Noted   Schizoaffective disorder, bipolar type (HCC) 05/10/2024   Polysubstance use disorder 04/28/2024   Cocaine  use disorder, severe, dependence (HCC) 03/31/2024   Osteomyelitis of left hip (HCC) 02/22/2024   Cellulitis 02/20/2024   Severe sepsis (HCC) 01/31/2024   Hypokalemia 01/31/2024   Chronic alcohol abuse 01/31/2024   Anemia of chronic disease 01/31/2024   Transaminitis 01/31/2024   Bipolar 1 disorder (HCC)    Amphetamine use disorder, 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   Polysubstance dependence (HCC) 12/13/2023   Polysubstance abuse (HCC) 11/21/2023   Alcohol use disorder 08/04/2023   Stimulant use disorder 08/04/2023   Heroin use disorder, severe (HCC) 08/04/2023   Tobacco use disorder 08/04/2023   Cannabis use disorder 08/04/2023   Hepatitis C 03/01/2023   Anxiety state 01/14/2023   Insomnia 01/14/2023   Schizoaffective disorder (HCC) 01/13/2023   Schizophrenia (HCC) 11/15/2019   Substance induced mood disorder (HCC) 10/24/2019   Methamphetamine use disorder, 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

## 2024-05-11 NOTE — ED Notes (Signed)
 MHT went to Pt's room to let him know it was med time he yelled I'm not getting up my fucking hand hurts RN made aware.

## 2024-05-11 NOTE — Group Note (Signed)
 Group Topic: Recovery Basics  Group Date: 05/11/2024 Start Time: 2000 End Time: 2100 Facilitators: Luvenia Mae SAUNDERS, NT  Department: [facility]  Number of Participants: 6  Group Focus: substance abuse education Treatment Modality:  Leisure Development Interventions utilized were leisure development Purpose: increase insight and relapse prevention strategies  Name: Mark Hammond Date of Birth: 29-Apr-1991  MR: [id number]    Level of Participation: active Quality of Participation: attentive and cooperative Interactions with others: Pt was attentive Mood/Affect: appropriate Triggers (if applicable): None Cognition: coherent/clear Progress: Gaining insight Response: None Plan: patient will be encouraged to continue to go to group  Patients Problems:  Patient Active Problem List   Diagnosis Date Noted   Schizoaffective disorder, bipolar type (HCC) 05/10/2024   Polysubstance use disorder 04/28/2024   Cocaine  use disorder, severe, dependence (HCC) 03/31/2024   Osteomyelitis of left hip (HCC) 02/22/2024   Cellulitis 02/20/2024   Severe sepsis (HCC) 01/31/2024   Hypokalemia 01/31/2024   Chronic alcohol abuse 01/31/2024   Anemia of chronic disease 01/31/2024   Transaminitis 01/31/2024   Bipolar 1 disorder (HCC)    Amphetamine use disorder, 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   Polysubstance dependence (HCC) 12/13/2023   Polysubstance abuse (HCC) 11/21/2023   Alcohol use disorder 08/04/2023   Stimulant use disorder 08/04/2023   Heroin use disorder, severe (HCC) 08/04/2023   Tobacco use disorder 08/04/2023   Cannabis use disorder 08/04/2023   Hepatitis C 03/01/2023   Anxiety state 01/14/2023   Insomnia 01/14/2023   Schizoaffective disorder (HCC) 01/13/2023   Schizophrenia (HCC) 11/15/2019   Substance induced mood disorder (HCC) 10/24/2019   Methamphetamine use disorder, 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

## 2024-05-11 NOTE — ED Notes (Signed)
 Patient is in the bedroom calm and sleeping. NAD Will monitor for safety.

## 2024-05-11 NOTE — ED Provider Notes (Signed)
 Behavioral Health Progress Note  Date and Time: 05/11/2024 8:29 AM Name: Mark Hammond MRN:  [id number]  Subjective:  Patient is lying in bed and reports that he is hurting. His pain he says is 8/10. Voices are good. Sleep last night was good. His mood is 4/10. He complains of withdrawal symptoms of body pain and diarrhea. He reports lip pain. He is isolating. He denies side-effects from medication.  Diagnosis:  Final diagnoses:  Heroin dependence (HCC)  Alcohol abuse  Schizoaffective disorder, unspecified type (HCC)    Total Time spent with patient: 20 minutes  Past Psychiatric History: He has been psychiatrically hospitalized 4-6 times and was first diagnosed Bipolar at age 48. He started using substances at age 65-16. His mother was murdered when he was 43 years old. He says he has never grieved this loss. He did not finish HS. 1 suicide attempt. Fentanyl  OD went to ICU. He has had 7 unintentional Ods on Fentanyl  in the last year.   Past Medical History: Pt reports he had hip surgery due to cellulitis,  Alcohol gastritis, Dental abscess, HTN Family History: Aunt mother and grandmother depression. Mother deceased-murdered Social History: His mother was murdered when he was 34 years old. He says he has never grieved this loss. He did not finish HS. He was expelled in 10th grade due to drawing graffiti in the bathroom. 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 Single, never married, no children.  Uncle, Aunt and GM has connection with.  Sleep: Good  Appetite:  Fair  Current Medications:  Current Facility-Administered Medications  Medication Dose Route Frequency Provider Last Rate Last Admin   acetaminophen  (TYLENOL ) tablet 650 mg  650 mg Oral Q6H PRN Hobson, Fran E, NP       acetaminophen  (TYLENOL ) tablet 650 mg  650 mg Oral Q6H PRN Avarie Tavano J, MD   650 mg at 05/10/24 1706   alum & mag hydroxide-simeth (MAALOX/MYLANTA) 200-200-20 MG/5ML suspension 30 mL  30 mL Oral Q4H PRN  Hobson, Fran E, NP       alum & mag hydroxide-simeth (MAALOX/MYLANTA) 200-200-20 MG/5ML suspension 30 mL  30 mL Oral Q4H PRN Tanysha Quant J, MD       ARIPiprazole  (ABILIFY ) tablet 5 mg  5 mg Oral Daily Toby Ayad J, MD       chlordiazePOXIDE  (LIBRIUM ) capsule 25 mg  25 mg Oral Q6H PRN Khilynn Borntreger J, MD       chlordiazePOXIDE  (LIBRIUM ) capsule 25 mg  25 mg Oral QID Jenea Dake J, MD   25 mg at 05/10/24 2207   Followed by   chlordiazePOXIDE  (LIBRIUM ) capsule 25 mg  25 mg Oral TID Adysen Raphael J, MD       Followed by   NOREEN ON 05/12/2024] chlordiazePOXIDE  (LIBRIUM ) capsule 25 mg  25 mg Oral BH-qamhs Ivadell Gaul, Garvin PARAS, MD       Followed by   NOREEN ON 05/13/2024] chlordiazePOXIDE  (LIBRIUM ) capsule 25 mg  25 mg Oral Daily Barbera Perritt J, MD       cloNIDine  (CATAPRES ) tablet 0.1 mg  0.1 mg Oral QID Lawrnce, Orel Hord J, MD   0.1 mg at 05/10/24 2206   Followed by   NOREEN ON 05/12/2024] cloNIDine  (CATAPRES ) tablet 0.1 mg  0.1 mg Oral BH-qamhs Veeda Virgo, Garvin PARAS, MD       Followed by   NOREEN ON 05/15/2024] cloNIDine  (CATAPRES ) tablet 0.1 mg  0.1 mg Oral QAC breakfast Riannon Mukherjee J, MD  dicyclomine  (BENTYL ) tablet 20 mg  20 mg Oral Q6H PRN Amor Packard J, MD       haloperidol  (HALDOL ) tablet 5 mg  5 mg Oral TID PRN Hobson, Fran E, NP       And   diphenhydrAMINE  (BENADRYL ) capsule 50 mg  50 mg Oral TID PRN Hobson, Fran E, NP       haloperidol  lactate (HALDOL ) injection 5 mg  5 mg Intramuscular TID PRN Lawrnce Garvin PARAS, MD       And   diphenhydrAMINE  (BENADRYL ) injection 50 mg  50 mg Intramuscular TID PRN Lucero Auzenne J, MD       haloperidol  lactate (HALDOL ) injection 10 mg  10 mg Intramuscular TID PRN Lawrnce Garvin PARAS, MD       And   diphenhydrAMINE  (BENADRYL ) injection 50 mg  50 mg Intramuscular TID PRN Hester Joslin J, MD       hydrOXYzine  (ATARAX ) tablet 25 mg  25 mg Oral Q6H PRN Deaunte Dente J, MD       loperamide  (IMODIUM ) capsule 2-4 mg   2-4 mg Oral PRN Lola Czerwonka J, MD       magnesium  hydroxide (MILK OF MAGNESIA) suspension 30 mL  30 mL Oral Daily PRN Hobson, Fran E, NP       magnesium  hydroxide (MILK OF MAGNESIA) suspension 30 mL  30 mL Oral Daily PRN Eyal Greenhaw J, MD       methocarbamol  (ROBAXIN ) tablet 500 mg  500 mg Oral Q8H PRN Sumit Branham J, MD       multivitamin with minerals tablet 1 tablet  1 tablet Oral Daily Joscelyne Renville J, MD   1 tablet at 05/10/24 1701   multivitamin with minerals tablet 1 tablet  1 tablet Oral Daily Kynslee Baham J, MD       naproxen  (NAPROSYN ) tablet 500 mg  500 mg Oral BID PRN Gustavo Dispenza J, MD       nicotine  (NICODERM CQ  - dosed in mg/24 hours) patch 21 mg  21 mg Transdermal Q0600 Kirke Breach J, MD   21 mg at 05/11/24 9472   OLANZapine  zydis (ZYPREXA ) disintegrating tablet 5 mg  5 mg Oral TID PRN Larren Copes J, MD       ondansetron  (ZOFRAN -ODT) disintegrating tablet 4 mg  4 mg Oral Q6H PRN Jalaiyah Throgmorton J, MD       QUEtiapine  (SEROQUEL ) tablet 50 mg  50 mg Oral QHS Jaspreet Hollings J, MD   50 mg at 05/10/24 2206   sertraline  (ZOLOFT ) tablet 25 mg  25 mg Oral Once Gerome Kokesh J, MD       thiamine  (VITAMIN B1) injection 100 mg  100 mg Intramuscular Once Dalvin Clipper J, MD       Current Outpatient Medications  Medication Sig Dispense Refill   ARIPiprazole  (ABILIFY ) 5 MG tablet Take 5 mg by mouth daily.     hydrOXYzine  (ATARAX ) 25 MG tablet Take 25 mg by mouth every 6 (six) hours as needed for anxiety.     QUEtiapine  (SEROQUEL ) 50 MG tablet Take 50 mg by mouth at bedtime.      Labs  Lab Results:  Admission on 05/10/2024  Component Date Value Ref Range Status   TSH 05/11/2024 0.752  0.350 - 4.500 uIU/mL Final   Comment: Performed by a 3rd Generation assay with a functional sensitivity of <=0.01 uIU/mL. Performed at [facility], [address] [place], [place] [address]   Admission on 05/10/2024, Discharged on 05/10/2024  Component  Date Value Ref Range Status   WBC 05/10/2024 8.3  4.0 - 10.5 K/uL Final   RBC 05/10/2024 4.14 (L)  4.22 - 5.81 MIL/uL Final   Hemoglobin 05/10/2024 10.9 (L)  13.0 - 17.0 g/dL Final   HCT 91/91/7974 33.7 (L)  39.0 - 52.0 % Final   MCV 05/10/2024 81.4  80.0 - 100.0 fL Final   MCH 05/10/2024 26.3  26.0 - 34.0 pg Final   MCHC 05/10/2024 32.3  30.0 - 36.0 g/dL Final   RDW 91/91/7974 14.1  11.5 - 15.5 % Final   Platelets 05/10/2024 302  150 - 400 K/uL Final   nRBC 05/10/2024 0.0  0.0 - 0.2 % Final   Neutrophils Relative % 05/10/2024 51  % Final   Neutro Abs 05/10/2024 4.2  1.7 - 7.7 K/uL Final   Lymphocytes Relative 05/10/2024 37  % Final   Lymphs Abs 05/10/2024 3.1  0.7 - 4.0 K/uL Final   Monocytes Relative 05/10/2024 10  % Final   Monocytes Absolute 05/10/2024 0.8  0.1 - 1.0 K/uL Final   Eosinophils Relative 05/10/2024 1  % Final   Eosinophils Absolute 05/10/2024 0.1  0.0 - 0.5 K/uL Final   Basophils Relative 05/10/2024 1  % Final   Basophils Absolute 05/10/2024 0.1  0.0 - 0.1 K/uL Final   Immature Granulocytes 05/10/2024 0  % Final   Abs Immature Granulocytes 05/10/2024 0.03  0.00 - 0.07 K/uL Final   Performed at [facility], [address], [place], [place] [address]   Sodium 05/10/2024 138  135 - 145 mmol/L Final   Potassium 05/10/2024 3.9  3.5 - 5.1 mmol/L Final   Chloride 05/10/2024 101  98 - 111 mmol/L Final   CO2 05/10/2024 24  22 - 32 mmol/L Final   Glucose, Bld 05/10/2024 83  70 - 99 mg/dL Final   Glucose reference range applies only to samples taken after fasting for at least 8 hours.   BUN 05/10/2024 <5 (L)  6 - 20 mg/dL Final   Creatinine, Ser 05/10/2024 0.57 (L)  0.61 - 1.24 mg/dL Final   Calcium 91/91/7974 8.8 (L)  8.9 - 10.3 mg/dL Final   Total Protein 91/91/7974 7.7  6.5 - 8.1 g/dL Final   Albumin  05/10/2024 3.5  3.5 - 5.0 g/dL Final   AST 91/91/7974 87 (H)  15 - 41 U/L Final   ALT 05/10/2024 80 (H)  0 - 44 U/L Final   Alkaline Phosphatase 05/10/2024 121  38 -  126 U/L Final   Total Bilirubin 05/10/2024 0.4  0.0 - 1.2 mg/dL Final   GFR, Estimated 05/10/2024 >60  >60 mL/min Final   Comment: (NOTE) Calculated using the CKD-EPI Creatinine Equation (2021)    Anion gap 05/10/2024 13  5 - 15 Final   Performed at [facility], [address], [place], [place] [address]   Alcohol, Ethyl (B) 05/10/2024 126 (H)  <15 mg/dL Final   Comment: (NOTE) For medical purposes only. Performed at [facility], [address], [place], [place] [address]    Cholesterol 05/10/2024 125  0 - 200 mg/dL Final   Triglycerides 91/91/7974 49  <150 mg/dL Final   HDL 91/91/7974 48  >40 mg/dL Final   Total CHOL/HDL Ratio 05/10/2024 2.6  RATIO Final   VLDL 05/10/2024 10  0 - 40 mg/dL Final   LDL Cholesterol 05/10/2024 67  0 - 99 mg/dL Final   Comment:        Total  Cholesterol/HDL:CHD Risk Coronary Heart Disease Risk Table                     Men   Women  1/2 Average Risk   3.4   3.3  Average Risk       5.0   4.4  2 X Average Risk   9.6   7.1  3 X Average Risk  23.4   11.0        Use the calculated Patient Ratio above and the CHD Risk Table to determine the patient's CHD Risk.        ATP III CLASSIFICATION (LDL):  <100     mg/dL   Optimal  899-870  mg/dL   Near or Above                    Optimal  130-159  mg/dL   Borderline  839-810  mg/dL   High  >809     mg/dL   Very High Performed at [facility], [address], [place], [place] [address]    POC Amphetamine UR 05/10/2024 None Detected  NONE DETECTED (Cut Off Level 1000 ng/mL) Final   POC Secobarbital (BAR) 05/10/2024 None Detected  NONE DETECTED (Cut Off Level 300 ng/mL) Final   POC Buprenorphine (BUP) 05/10/2024 None Detected  NONE DETECTED (Cut Off Level 10 ng/mL) Final   POC Oxazepam (BZO) 05/10/2024 None Detected  NONE DETECTED (Cut Off Level 300 ng/mL) Final   POC Cocaine  UR 05/10/2024 Positive (A)  NONE DETECTED (Cut Off Level 300 ng/mL) Final   POC Methamphetamine UR 05/10/2024  Positive (A)  NONE DETECTED (Cut Off Level 1000 ng/mL) Final   POC Morphine  05/10/2024 None Detected  NONE DETECTED (Cut Off Level 300 ng/mL) Final   POC Methadone UR 05/10/2024 None Detected  NONE DETECTED (Cut Off Level 300 ng/mL) Final   POC Oxycodone  UR 05/10/2024 None Detected  NONE DETECTED (Cut Off Level 100 ng/mL) Final   POC Marijuana UR 05/10/2024 None Detected  NONE DETECTED (Cut Off Level 50 ng/mL) Final  Admission on 05/09/2024, Discharged on 05/09/2024  Component Date Value Ref Range Status   Glucose-Capillary 05/09/2024 122 (H)  70 - 99 mg/dL Final   Glucose reference range applies only to samples taken after fasting for at least 8 hours.  Admission on 04/27/2024, Discharged on 04/28/2024  Component Date Value Ref Range Status   WBC 04/27/2024 8.4  4.0 - 10.5 K/uL Final   RBC 04/27/2024 3.91 (L)  4.22 - 5.81 MIL/uL Final   Hemoglobin 04/27/2024 10.3 (L)  13.0 - 17.0 g/dL Final   HCT 92/73/7974 32.3 (L)  39.0 - 52.0 % Final   MCV 04/27/2024 82.6  80.0 - 100.0 fL Final   MCH 04/27/2024 26.3  26.0 - 34.0 pg Final   MCHC 04/27/2024 31.9  30.0 - 36.0 g/dL Final   RDW 92/73/7974 13.5  11.5 - 15.5 % Final   Platelets 04/27/2024 299  150 - 400 K/uL Final   nRBC 04/27/2024 0.0  0.0 - 0.2 % Final   Neutrophils Relative % 04/27/2024 63  % Final   Neutro Abs 04/27/2024 5.4  1.7 - 7.7 K/uL Final   Lymphocytes Relative 04/27/2024 22  % Final   Lymphs Abs 04/27/2024 1.8  0.7 - 4.0 K/uL Final   Monocytes Relative 04/27/2024 13  % Final   Monocytes Absolute 04/27/2024 1.1 (H)  0.1 - 1.0 K/uL Final   Eosinophils Relative 04/27/2024 0  %  Final   Eosinophils Absolute 04/27/2024 0.0  0.0 - 0.5 K/uL Final   Basophils Relative 04/27/2024 1  % Final   Basophils Absolute 04/27/2024 0.1  0.0 - 0.1 K/uL Final   Immature Granulocytes 04/27/2024 1  % Final   Abs Immature Granulocytes 04/27/2024 0.04  0.00 - 0.07 K/uL Final   Performed at [facility], [address],  [place], [place] [address]   Sodium 04/27/2024 132 (L)  135 - 145 mmol/L Final   Potassium 04/27/2024 3.6  3.5 - 5.1 mmol/L Final   Chloride 04/27/2024 98  98 - 111 mmol/L Final   CO2 04/27/2024 25  22 - 32 mmol/L Final   Glucose, Bld 04/27/2024 128 (H)  70 - 99 mg/dL Final   Glucose reference range applies only to samples taken after fasting for at least 8 hours.   BUN 04/27/2024 13  6 - 20 mg/dL Final   Creatinine, Ser 04/27/2024 0.52 (L)  0.61 - 1.24 mg/dL Final   Calcium 92/73/7974 8.9  8.9 - 10.3 mg/dL Final   GFR, Estimated 04/27/2024 >60  >60 mL/min Final   Comment: (NOTE) Calculated using the CKD-EPI Creatinine Equation (2021)    Anion gap 04/27/2024 9  5 - 15 Final   Performed at [facility], [address], [place], [place] [address]   Alcohol, Ethyl (B) 04/27/2024 <15  <15 mg/dL Final   Comment: (NOTE) For medical purposes only. Performed at [facility], [address] [place], [place] [address]    Opiates 04/27/2024 POSITIVE (A)  NONE DETECTED Final   Cocaine  04/27/2024 POSITIVE (A)  NONE DETECTED Final   Benzodiazepines 04/27/2024 NONE DETECTED  NONE DETECTED Final   Amphetamines 04/27/2024 NONE DETECTED  NONE DETECTED Final   Tetrahydrocannabinol 04/27/2024 POSITIVE (A)  NONE DETECTED Final   Barbiturates 04/27/2024 NONE DETECTED  NONE DETECTED Final   Comment: (NOTE) DRUG SCREEN FOR MEDICAL PURPOSES ONLY.  IF CONFIRMATION IS NEEDED FOR ANY PURPOSE, NOTIFY LAB WITHIN 5 DAYS.  LOWEST DETECTABLE LIMITS FOR URINE DRUG SCREEN Drug Class                     Cutoff (ng/mL) Amphetamine and metabolites    1000 Barbiturate and metabolites    200 Benzodiazepine                 200 Opiates and metabolites        300 Cocaine  and metabolites        300 THC                            50 Performed at [facility], [address] [place], [place] [address]   Admission on 04/09/2024, Discharged on 04/10/2024  Component  Date Value Ref Range Status   Sodium 04/09/2024 136  135 - 145 mmol/L Final   Potassium 04/09/2024 3.2 (L)  3.5 - 5.1 mmol/L Final   Chloride 04/09/2024 100  98 - 111 mmol/L Final   CO2 04/09/2024 22  22 - 32 mmol/L Final   Glucose, Bld 04/09/2024 94  70 - 99 mg/dL Final   Glucose reference range applies only to samples taken after fasting for at least 8 hours.   BUN 04/09/2024 <5 (L)  6 - 20 mg/dL Final   Creatinine, Ser 04/09/2024 0.59 (L)  0.61 - 1.24 mg/dL Final   Calcium 92/91/7974 8.7 (L)  8.9 - 10.3 mg/dL  Final   Total Protein 04/09/2024 8.5 (H)  6.5 - 8.1 g/dL Final   Albumin  04/09/2024 4.0  3.5 - 5.0 g/dL Final   AST 92/91/7974 78 (H)  15 - 41 U/L Final   ALT 04/09/2024 33  0 - 44 U/L Final   Alkaline Phosphatase 04/09/2024 120  38 - 126 U/L Final   Total Bilirubin 04/09/2024 0.7  0.0 - 1.2 mg/dL Final   GFR, Estimated 04/09/2024 >60  >60 mL/min Final   Comment: (NOTE) Calculated using the CKD-EPI Creatinine Equation (2021)    Anion gap 04/09/2024 14  5 - 15 Final   Performed at [facility], [address] [place], [place] [address]   Alcohol, Ethyl (B) 04/09/2024 125 (H)  <15 mg/dL Final   Comment: (NOTE) For medical purposes only. Performed at [facility], [address] [place], [place] [address]    Opiates 04/09/2024 NONE DETECTED  NONE DETECTED Final   Cocaine  04/09/2024 POSITIVE (A)  NONE DETECTED Final   Benzodiazepines 04/09/2024 POSITIVE (A)  NONE DETECTED Final   Amphetamines 04/09/2024 POSITIVE (A)  NONE DETECTED Final   Comment: (NOTE) Trazodone  is metabolized in vivo to several metabolites, including pharmacologically active m-CPP, which is excreted in the urine. Immunoassay screens for amphetamines and MDMA have potential cross-reactivity with these compounds and may provide false positive  results.     Tetrahydrocannabinol 04/09/2024 NONE DETECTED  NONE DETECTED Final   Barbiturates 04/09/2024 NONE DETECTED   NONE DETECTED Final   Comment: (NOTE) DRUG SCREEN FOR MEDICAL PURPOSES ONLY.  IF CONFIRMATION IS NEEDED FOR ANY PURPOSE, NOTIFY LAB WITHIN 5 DAYS.  LOWEST DETECTABLE LIMITS FOR URINE DRUG SCREEN Drug Class                     Cutoff (ng/mL) Amphetamine and metabolites    1000 Barbiturate and metabolites    200 Benzodiazepine                 200 Opiates and metabolites        300 Cocaine  and metabolites        300 THC                            50 Performed at [facility], [address] [place], [place] [address]    WBC 04/09/2024 8.1  4.0 - 10.5 K/uL Final   RBC 04/09/2024 4.07 (L)  4.22 - 5.81 MIL/uL Final   Hemoglobin 04/09/2024 11.2 (L)  13.0 - 17.0 g/dL Final   HCT 92/91/7974 34.8 (L)  39.0 - 52.0 % Final   MCV 04/09/2024 85.5  80.0 - 100.0 fL Final   MCH 04/09/2024 27.5  26.0 - 34.0 pg Final   MCHC 04/09/2024 32.2  30.0 - 36.0 g/dL Final   RDW 92/91/7974 13.2  11.5 - 15.5 % Final   Platelets 04/09/2024 411 (H)  150 - 400 K/uL Final   nRBC 04/09/2024 0.0  0.0 - 0.2 % Final   Neutrophils Relative % 04/09/2024 60  % Final   Neutro Abs 04/09/2024 4.9  1.7 - 7.7 K/uL Final   Lymphocytes Relative 04/09/2024 30  % Final   Lymphs Abs 04/09/2024 2.4  0.7 - 4.0 K/uL Final   Monocytes Relative 04/09/2024 7  % Final   Monocytes Absolute 04/09/2024 0.6  0.1 - 1.0 K/uL Final   Eosinophils Relative 04/09/2024 1  % Final   Eosinophils  Absolute 04/09/2024 0.1  0.0 - 0.5 K/uL Final   Basophils Relative 04/09/2024 2  % Final   Basophils Absolute 04/09/2024 0.1  0.0 - 0.1 K/uL Final   Immature Granulocytes 04/09/2024 0  % Final   Abs Immature Granulocytes 04/09/2024 0.02  0.00 - 0.07 K/uL Final   Performed at [facility], [address], [place], [place] [address]  Admission on 04/08/2024, Discharged on 04/08/2024  Component Date Value Ref Range Status   Sodium 04/08/2024 136  135 - 145 mmol/L Final   Potassium 04/08/2024 3.3 (L)  3.5 - 5.1  mmol/L Final   Chloride 04/08/2024 97 (L)  98 - 111 mmol/L Final   CO2 04/08/2024 22  22 - 32 mmol/L Final   Glucose, Bld 04/08/2024 96  70 - 99 mg/dL Final   Glucose reference range applies only to samples taken after fasting for at least 8 hours.   BUN 04/08/2024 5 (L)  6 - 20 mg/dL Final   Creatinine, Ser 04/08/2024 0.61  0.61 - 1.24 mg/dL Final   Calcium 92/92/7974 8.6 (L)  8.9 - 10.3 mg/dL Final   Total Protein 92/92/7974 7.9  6.5 - 8.1 g/dL Final   Albumin  04/08/2024 3.6  3.5 - 5.0 g/dL Final   AST 92/92/7974 71 (H)  15 - 41 U/L Final   ALT 04/08/2024 30  0 - 44 U/L Final   Alkaline Phosphatase 04/08/2024 100  38 - 126 U/L Final   Total Bilirubin 04/08/2024 0.5  0.0 - 1.2 mg/dL Final   GFR, Estimated 04/08/2024 >60  >60 mL/min Final   Comment: (NOTE) Calculated using the CKD-EPI Creatinine Equation (2021)    Anion gap 04/08/2024 17 (H)  5 - 15 Final   Performed at [facility], [address] [place], [place] [address]   Alcohol, Ethyl (B) 04/08/2024 100 (H)  <15 mg/dL Final   Comment: (NOTE) For medical purposes only. Performed at [facility], [address] [place], [place] [address]    WBC 04/08/2024 6.8  4.0 - 10.5 K/uL Final   RBC 04/08/2024 4.14 (L)  4.22 - 5.81 MIL/uL Final   Hemoglobin 04/08/2024 11.6 (L)  13.0 - 17.0 g/dL Final   HCT 92/92/7974 35.0 (L)  39.0 - 52.0 % Final   MCV 04/08/2024 84.5  80.0 - 100.0 fL Final   MCH 04/08/2024 28.0  26.0 - 34.0 pg Final   MCHC 04/08/2024 33.1  30.0 - 36.0 g/dL Final   RDW 92/92/7974 13.2  11.5 - 15.5 % Final   Platelets 04/08/2024 407 (H)  150 - 400 K/uL Final   nRBC 04/08/2024 0.0  0.0 - 0.2 % Final   Performed at [facility], [address] [place], [place] [address]   Opiates 04/08/2024 NONE DETECTED  NONE DETECTED Final   Cocaine  04/08/2024 POSITIVE (A)  NONE DETECTED Final   Benzodiazepines 04/08/2024 POSITIVE (A)  NONE DETECTED Final   Amphetamines 04/08/2024 POSITIVE (A)  NONE DETECTED Final    Comment: (NOTE) Trazodone  is metabolized in vivo to several metabolites, including pharmacologically active m-CPP, which is excreted in the urine. Immunoassay screens for amphetamines and MDMA have potential cross-reactivity with these compounds and may provide false positive  results.     Tetrahydrocannabinol 04/08/2024 NONE DETECTED  NONE DETECTED Final   Barbiturates 04/08/2024 NONE DETECTED  NONE DETECTED Final   Comment: (NOTE) DRUG SCREEN FOR MEDICAL PURPOSES ONLY.  IF CONFIRMATION IS NEEDED FOR ANY PURPOSE, NOTIFY LAB WITHIN  5 DAYS.  LOWEST DETECTABLE LIMITS FOR URINE DRUG SCREEN Drug Class                     Cutoff (ng/mL) Amphetamine and metabolites    1000 Barbiturate and metabolites    200 Benzodiazepine                 200 Opiates and metabolites        300 Cocaine  and metabolites        300 THC                            50 Performed at [facility], [address], [place], [place] [address]    Acetaminophen  (Tylenol ), Serum 04/08/2024 <10 (L)  10 - 30 ug/mL Final   Comment: (NOTE) Therapeutic concentrations vary significantly. A range of 10-30 ug/mL  may be an effective concentration for many patients. However, some  are best treated at concentrations outside of this range. Acetaminophen  concentrations >150 ug/mL at 4 hours after ingestion  and >50 ug/mL at 12 hours after ingestion are often associated with  toxic reactions.  Performed at [facility], [address], [place], [place] [address]    Salicylate Lvl 04/08/2024 <7.0 (L)  7.0 - 30.0 mg/dL Final   Performed at [facility], [address] [place], [place] [address]  Admission on 04/03/2024, Discharged on 04/03/2024  Component Date Value Ref Range Status   Sodium 04/03/2024 134 (L)  135 - 145 mmol/L Final   Potassium 04/03/2024 3.7  3.5 - 5.1 mmol/L Final   Chloride 04/03/2024 98  98 - 111 mmol/L Final   CO2 04/03/2024 24  22 - 32 mmol/L Final   Glucose, Bld 04/03/2024 94   70 - 99 mg/dL Final   Glucose reference range applies only to samples taken after fasting for at least 8 hours.   BUN 04/03/2024 6  6 - 20 mg/dL Final   Creatinine, Ser 04/03/2024 0.53 (L)  0.61 - 1.24 mg/dL Final   Calcium 92/97/7974 8.9  8.9 - 10.3 mg/dL Final   Total Protein 92/97/7974 8.7 (H)  6.5 - 8.1 g/dL Final   Albumin  04/03/2024 3.8  3.5 - 5.0 g/dL Final   AST 92/97/7974 52 (H)  15 - 41 U/L Final   ALT 04/03/2024 25  0 - 44 U/L Final   Alkaline Phosphatase 04/03/2024 114  38 - 126 U/L Final   Total Bilirubin 04/03/2024 0.6  0.0 - 1.2 mg/dL Final   GFR, Estimated 04/03/2024 >60  >60 mL/min Final   Comment: (NOTE) Calculated using the CKD-EPI Creatinine Equation (2021)    Anion gap 04/03/2024 12  5 - 15 Final   Performed at [facility], [address] [place], [place] [address]   WBC 04/03/2024 7.8  4.0 - 10.5 K/uL Final   RBC 04/03/2024 3.97 (L)  4.22 - 5.81 MIL/uL Final   Hemoglobin 04/03/2024 11.1 (L)  13.0 - 17.0 g/dL Final   HCT 92/97/7974 34.8 (L)  39.0 - 52.0 % Final   MCV 04/03/2024 87.7  80.0 - 100.0 fL Final   MCH 04/03/2024 28.0  26.0 - 34.0 pg Final   MCHC 04/03/2024 31.9  30.0 - 36.0 g/dL Final   RDW 92/97/7974 13.4  11.5 - 15.5 % Final   Platelets 04/03/2024 356  150 - 400 K/uL Final   nRBC 04/03/2024 0.0  0.0 -  0.2 % Final   Neutrophils Relative % 04/03/2024 54  % Final   Neutro Abs 04/03/2024 4.1  1.7 - 7.7 K/uL Final   Lymphocytes Relative 04/03/2024 34  % Final   Lymphs Abs 04/03/2024 2.6  0.7 - 4.0 K/uL Final   Monocytes Relative 04/03/2024 11  % Final   Monocytes Absolute 04/03/2024 0.9  0.1 - 1.0 K/uL Final   Eosinophils Relative 04/03/2024 0  % Final   Eosinophils Absolute 04/03/2024 0.0  0.0 - 0.5 K/uL Final   Basophils Relative 04/03/2024 1  % Final   Basophils Absolute 04/03/2024 0.1  0.0 - 0.1 K/uL Final   Immature Granulocytes 04/03/2024 0  % Final   Abs Immature Granulocytes 04/03/2024 0.02  0.00 - 0.07 K/uL Final    Performed at [facility], [address], [place], [place] [address]   Sed Rate 04/03/2024 30 (H)  0 - 16 mm/hr Final   Performed at [facility], [address], [place], [place] [address]   CRP 04/03/2024 1.3 (H)  <1.0 mg/dL Final   Performed at [facility], [address], [place], [place] [address]  Admission on 03/30/2024, Discharged on 04/01/2024  Component Date Value Ref Range Status   Glucose-Capillary 03/30/2024 107 (H)  70 - 99 mg/dL Final   Glucose reference range applies only to samples taken after fasting for at least 8 hours.   Sodium 03/30/2024 142  135 - 145 mmol/L Final   Potassium 03/30/2024 3.3 (L)  3.5 - 5.1 mmol/L Final   Chloride 03/30/2024 106  98 - 111 mmol/L Final   CO2 03/30/2024 23  22 - 32 mmol/L Final   Glucose, Bld 03/30/2024 107 (H)  70 - 99 mg/dL Final   Glucose reference range applies only to samples taken after fasting for at least 8 hours.   BUN 03/30/2024 5 (L)  6 - 20 mg/dL Final   Creatinine, Ser 03/30/2024 0.58 (L)  0.61 - 1.24 mg/dL Final   Calcium 93/71/7974 8.1 (L)  8.9 - 10.3 mg/dL Final   Total Protein 93/71/7974 7.4  6.5 - 8.1 g/dL Final   Albumin  03/30/2024 3.5  3.5 - 5.0 g/dL Final   AST 93/71/7974 53 (H)  15 - 41 U/L Final   ALT 03/30/2024 25  0 - 44 U/L Final   Alkaline Phosphatase 03/30/2024 103  38 - 126 U/L Final   Total Bilirubin 03/30/2024 0.4  0.0 - 1.2 mg/dL Final   GFR, Estimated 03/30/2024 >60  >60 mL/min Final   Comment: (NOTE) Calculated using the CKD-EPI Creatinine Equation (2021)    Anion gap 03/30/2024 13  5 - 15 Final   Performed at [facility], [address] [place], [place] [address]   Salicylate Lvl 03/30/2024 <7.0 (L)  7.0 - 30.0 mg/dL Final   Performed at [facility], [address] [place], [place] [address]   Acetaminophen  (Tylenol ), Serum 03/30/2024 <10 (L)  10 - 30 ug/mL Final   Comment: (NOTE) Therapeutic concentrations vary significantly. A range  of 10-30 ug/mL  may be an effective concentration for many patients. However, some  are best treated at concentrations outside of this range. Acetaminophen  concentrations >150 ug/mL at 4 hours after ingestion  and >50 ug/mL at 12 hours after ingestion are often associated with  toxic reactions.  Performed at [facility], [address] [place], [place] [address]    Alcohol, Ethyl (B) 03/30/2024 243 (H)  <15  mg/dL Final   Comment: (NOTE) For medical purposes only. Performed at [facility], [address], [place], [place] [address]    Opiates 03/30/2024 NONE DETECTED  NONE DETECTED Final   Cocaine  03/30/2024 POSITIVE (A)  NONE DETECTED Final   Benzodiazepines 03/30/2024 POSITIVE (A)  NONE DETECTED Final   Amphetamines 03/30/2024 NONE DETECTED  NONE DETECTED Final   Tetrahydrocannabinol 03/30/2024 NONE DETECTED  NONE DETECTED Final   Barbiturates 03/30/2024 NONE DETECTED  NONE DETECTED Final   Comment: (NOTE) DRUG SCREEN FOR MEDICAL PURPOSES ONLY.  IF CONFIRMATION IS NEEDED FOR ANY PURPOSE, NOTIFY LAB WITHIN 5 DAYS.  LOWEST DETECTABLE LIMITS FOR URINE DRUG SCREEN Drug Class                     Cutoff (ng/mL) Amphetamine and metabolites    1000 Barbiturate and metabolites    200 Benzodiazepine                 200 Opiates and metabolites        300 Cocaine  and metabolites        300 THC                            50 Performed at [facility], [address] [place], [place] [address]    WBC 03/30/2024 6.0  4.0 - 10.5 K/uL Final   RBC 03/30/2024 3.66 (L)  4.22 - 5.81 MIL/uL Final   Hemoglobin 03/30/2024 10.6 (L)  13.0 - 17.0 g/dL Final   HCT 93/71/7974 32.1 (L)  39.0 - 52.0 % Final   MCV 03/30/2024 87.7  80.0 - 100.0 fL Final   MCH 03/30/2024 29.0  26.0 - 34.0 pg Final   MCHC 03/30/2024 33.0  30.0 - 36.0 g/dL Final   RDW 93/71/7974 13.2  11.5 - 15.5 % Final   Platelets 03/30/2024 307  150 - 400 K/uL Final   nRBC 03/30/2024 0.0  0.0 - 0.2 % Final    Neutrophils Relative % 03/30/2024 57  % Final   Neutro Abs 03/30/2024 3.5  1.7 - 7.7 K/uL Final   Lymphocytes Relative 03/30/2024 33  % Final   Lymphs Abs 03/30/2024 2.0  0.7 - 4.0 K/uL Final   Monocytes Relative 03/30/2024 7  % Final   Monocytes Absolute 03/30/2024 0.4  0.1 - 1.0 K/uL Final   Eosinophils Relative 03/30/2024 1  % Final   Eosinophils Absolute 03/30/2024 0.1  0.0 - 0.5 K/uL Final   Basophils Relative 03/30/2024 1  % Final   Basophils Absolute 03/30/2024 0.1  0.0 - 0.1 K/uL Final   Immature Granulocytes 03/30/2024 1  % Final   Abs Immature Granulocytes 03/30/2024 0.03  0.00 - 0.07 K/uL Final   Performed at [facility], [address], [place], [place] [address]  Admission on 03/29/2024, Discharged on 03/29/2024  Component Date Value Ref Range Status   Sodium 03/29/2024 140  135 - 145 mmol/L Final   Potassium 03/29/2024 3.2 (L)  3.5 - 5.1 mmol/L Final   Chloride 03/29/2024 103  98 - 111 mmol/L Final   CO2 03/29/2024 27  22 - 32 mmol/L Final   Glucose, Bld 03/29/2024 101 (H)  70 - 99 mg/dL Final   Glucose reference range applies only to samples taken after fasting for at least 8 hours.   BUN 03/29/2024 8  6 - 20 mg/dL Final   Creatinine, Ser 03/29/2024 0.55 (L)  0.61 - 1.24 mg/dL Final   Calcium 93/72/7974 8.4 (L)  8.9 - 10.3 mg/dL Final   Total Protein 93/72/7974 7.0  6.5 - 8.1 g/dL Final   Albumin  03/29/2024 3.3 (L)  3.5 - 5.0 g/dL Final   AST 93/72/7974 43 (H)  15 - 41 U/L Final   ALT 03/29/2024 20  0 - 44 U/L Final   Alkaline Phosphatase 03/29/2024 107  38 - 126 U/L Final   Total Bilirubin 03/29/2024 0.5  0.0 - 1.2 mg/dL Final   GFR, Estimated 03/29/2024 >60  >60 mL/min Final   Comment: (NOTE) Calculated using the CKD-EPI Creatinine Equation (2021)    Anion gap 03/29/2024 10  5 - 15 Final   Performed at [facility], [address], [place], [place] [address]   Opiates 03/29/2024 NONE DETECTED  NONE DETECTED Final   Cocaine  03/29/2024  POSITIVE (A)  NONE DETECTED Final   Benzodiazepines 03/29/2024 POSITIVE (A)  NONE DETECTED Final   Amphetamines 03/29/2024 NONE DETECTED  NONE DETECTED Final   Tetrahydrocannabinol 03/29/2024 NONE DETECTED  NONE DETECTED Final   Barbiturates 03/29/2024 NONE DETECTED  NONE DETECTED Final   Comment: (NOTE) DRUG SCREEN FOR MEDICAL PURPOSES ONLY.  IF CONFIRMATION IS NEEDED FOR ANY PURPOSE, NOTIFY LAB WITHIN 5 DAYS.  LOWEST DETECTABLE LIMITS FOR URINE DRUG SCREEN Drug Class                     Cutoff (ng/mL) Amphetamine and metabolites    1000 Barbiturate and metabolites    200 Benzodiazepine                 200 Opiates and metabolites        300 Cocaine  and metabolites        300 THC                            50 Performed at [facility], [address], [place], [place] [address]    WBC 03/29/2024 3.4 (L)  4.0 - 10.5 K/uL Final   RBC 03/29/2024 3.37 (L)  4.22 - 5.81 MIL/uL Final   Hemoglobin 03/29/2024 9.7 (L)  13.0 - 17.0 g/dL Final   HCT 93/72/7974 30.5 (L)  39.0 - 52.0 % Final   MCV 03/29/2024 90.5  80.0 - 100.0 fL Final   MCH 03/29/2024 28.8  26.0 - 34.0 pg Final   MCHC 03/29/2024 31.8  30.0 - 36.0 g/dL Final   RDW 93/72/7974 13.1  11.5 - 15.5 % Final   Platelets 03/29/2024 204  150 - 400 K/uL Final   nRBC 03/29/2024 0.0  0.0 - 0.2 % Final   Neutrophils Relative % 03/29/2024 43  % Final   Neutro Abs 03/29/2024 1.5 (L)  1.7 - 7.7 K/uL Final   Lymphocytes Relative 03/29/2024 43  % Final   Lymphs Abs 03/29/2024 1.5  0.7 - 4.0 K/uL Final   Monocytes Relative 03/29/2024 12  % Final   Monocytes Absolute 03/29/2024 0.4  0.1 - 1.0 K/uL Final   Eosinophils Relative 03/29/2024 1  % Final   Eosinophils Absolute 03/29/2024 0.0  0.0 - 0.5 K/uL Final   Basophils Relative 03/29/2024 1  % Final   Basophils Absolute 03/29/2024 0.0  0.0 - 0.1 K/uL Final   Immature Granulocytes 03/29/2024 0  % Final   Abs Immature Granulocytes 03/29/2024 0.01  0.00 - 0.07 K/uL Final    Performed at [facility], [address] [place], [place] [address]   Alcohol, Ethyl (B) 03/29/2024 <15  <15 mg/dL Final   Comment: (NOTE) For medical purposes only. Performed at [facility], [address] [place], [place] [address]   Admission on 03/27/2024, Discharged on 03/28/2024  Component Date Value Ref Range Status   Glucose-Capillary 03/28/2024 131 (H)  70 - 99 mg/dL Final   Glucose reference range applies only to samples taken after fasting for at least 8 hours.   Sodium 03/27/2024 137  135 - 145 mmol/L Final   Potassium 03/27/2024 3.2 (L)  3.5 - 5.1 mmol/L Final   Chloride 03/27/2024 101  98 - 111 mmol/L Final   CO2 03/27/2024 24  22 - 32 mmol/L Final   Glucose, Bld 03/27/2024 114 (H)  70 - 99 mg/dL Final   Glucose reference range applies only to samples taken after fasting for at least 8 hours.   BUN 03/27/2024 <5 (L)  6 - 20 mg/dL Final   Creatinine, Ser 03/27/2024 0.62  0.61 - 1.24 mg/dL Final   Calcium 93/74/7974 8.5 (L)  8.9 - 10.3 mg/dL Final   Total Protein 93/74/7974 8.6 (H)  6.5 - 8.1 g/dL Final   Albumin  03/27/2024 4.1  3.5 - 5.0 g/dL Final   AST 93/74/7974 43 (H)  15 - 41 U/L Final   ALT 03/27/2024 22  0 - 44 U/L Final   Alkaline Phosphatase 03/27/2024 122  38 - 126 U/L Final   Total Bilirubin 03/27/2024 0.7  0.0 - 1.2 mg/dL Final   GFR, Estimated 03/27/2024 >60  >60 mL/min Final   Comment: (NOTE) Calculated using the CKD-EPI Creatinine Equation (2021)    Anion gap 03/27/2024 12  5 - 15 Final   Performed at [facility], [address], [place], [place] [address]   Salicylate Lvl 03/27/2024 <7.0 (L)  7.0 - 30.0 mg/dL Final   Performed at [facility], [address], [place], [place] [address]   Acetaminophen  (Tylenol ), Serum 03/27/2024 <10 (L)  10 - 30 ug/mL Final   Comment: (NOTE) Therapeutic concentrations vary significantly. A range of 10-30 ug/mL  may be an effective  concentration for many patients. However, some  are best treated at concentrations outside of this range. Acetaminophen  concentrations >150 ug/mL at 4 hours after ingestion  and >50 ug/mL at 12 hours after ingestion are often associated with  toxic reactions.  Performed at [facility], [address] [place], [place] [address]    Alcohol, Ethyl (B) 03/27/2024 146 (H)  <15 mg/dL Final   Comment: (NOTE) For medical purposes only. Performed at [facility], [address], [place], [place] [address]    WBC 03/27/2024 8.0  4.0 - 10.5 K/uL Final   RBC 03/27/2024 3.83 (L)  4.22 - 5.81 MIL/uL Final   Hemoglobin 03/27/2024 10.9 (L)  13.0 - 17.0 g/dL Final   HCT 93/74/7974 34.2 (L)  39.0 - 52.0 % Final   MCV 03/27/2024 89.3  80.0 - 100.0 fL Final   MCH 03/27/2024 28.5  26.0 - 34.0 pg Final   MCHC 03/27/2024 31.9  30.0 - 36.0 g/dL Final   RDW 93/74/7974 13.2  11.5 - 15.5 % Final   Platelets 03/27/2024 318  150 - 400 K/uL Final   nRBC 03/27/2024 0.0  0.0 - 0.2 % Final   Neutrophils Relative % 03/27/2024 73  % Final   Neutro Abs 03/27/2024 5.8  1.7 - 7.7 K/uL Final  Lymphocytes Relative 03/27/2024 20  % Final   Lymphs Abs 03/27/2024 1.6  0.7 - 4.0 K/uL Final   Monocytes Relative 03/27/2024 6  % Final   Monocytes Absolute 03/27/2024 0.5  0.1 - 1.0 K/uL Final   Eosinophils Relative 03/27/2024 0  % Final   Eosinophils Absolute 03/27/2024 0.0  0.0 - 0.5 K/uL Final   Basophils Relative 03/27/2024 1  % Final   Basophils Absolute 03/27/2024 0.1  0.0 - 0.1 K/uL Final   Immature Granulocytes 03/27/2024 0  % Final   Abs Immature Granulocytes 03/27/2024 0.03  0.00 - 0.07 K/uL Final   Performed at [facility], [address], [place], [place] [address]  There may be more visits with results that are not included.    Blood Alcohol level:  Lab Results  Component Value Date   ETH 126 (H) 05/10/2024   ETH <15 04/27/2024    Metabolic  Disorder Labs: Lab Results  Component Value Date   HGBA1C 6.0 (H) 12/08/2023   MPG 125.5 12/08/2023   MPG 122.63 11/25/2023   Lab Results  Component Value Date   PROLACTIN 31.4 (H) 11/16/2019   Lab Results  Component Value Date   CHOL 125 05/10/2024   TRIG 49 05/10/2024   HDL 48 05/10/2024   CHOLHDL 2.6 05/10/2024   VLDL 10 05/10/2024   LDLCALC 67 05/10/2024   LDLCALC 51 12/08/2023    Therapeutic Lab Levels: No results found for: LITHIUM No results found for: VALPROATE No results found for: CBMZ  Physical Findings   AIMS    Flowsheet Row [facility]) [facility] (Discharged) [facility] Ad[facility] (Discharged) [facility]  AIMS Total Score 0 0 0   AUDIT    Flowsheet Row ED from 05/10/2024 in [facility] Admission (Discharged) [facility] Ad[facility] (Discharged) [facility] Admission (Discharged) from OP Visit [facility] Ad[facility] (Discharged) [facility]  Alcohol Use Disorder Identification Test Final Score (AUDIT) 31 26 26 13  37   PHQ2-9    Flowsheet Row ED from 01/14/2024 in [facility] ED from 12/08/2023 in [facility] ED from 11/21/2023 in [facility] ED from 06/03/2023 in [facility]  PHQ-2 Total Score 0 0 2 0  PHQ-9 Total Score 14 0 8 --   Flowsheet Row ED from 05/10/2024 in [facility] Most recent reading at 05/10/2024 10:06 AM ED from 05/10/2024 in [facility] Most recent reading at 05/10/2024   7:38 AM ED from 05/09/2024 in [facility] Emergency Department at [facility] Most recent reading at 05/09/2024  7:24 AM  C-SSRS RISK CATEGORY Moderate Risk Moderate Risk Error: Q3, 4, or 5 should not be populated when Q2 is No     Musculoskeletal  Strength & Muscle Tone: within normal limits Gait & Station: uses a walker Patient leans: Left  Psychiatric Specialty Exam  Presentation  General Appearance:  Appropriate for Environment  Eye Contact: Good  Speech: Clear and Coherent  Speech Volume: Normal  Handedness: Right   Mood and Affect  Mood: Depressed  Affect: Congruent; Depressed   Thought Process  Thought Processes:  Linear; Goal Directed  Descriptions of Associations:Intact  Orientation:Full (Time, Place and Person)  Thought Content:Logical  Diagnosis of Schizophrenia or Schizoaffective disorder in past: Yes  Duration of Psychotic Symptoms: Greater than six months   Hallucinations:Hallucinations: Auditory Description of Auditory Hallucinations: daily voices reduce when he takes medication and increase when he uses substances, multiple voices, neg and positive talk about and to him  Ideas of Reference:None  Suicidal Thoughts:Suicidal Thoughts: Yes, Passive SI Passive Intent and/or Plan: Without Intent  Homicidal Thoughts:Homicidal Thoughts: No   Sensorium  Memory: Immediate Good; Recent Good; Remote Good  Judgment: Fair  Insight: Good   Executive Functions  Concentration: Good  Attention Span: Good  Recall: Good  Fund of Knowledge: Good  Language: Good   Psychomotor Activity  Psychomotor Activity: Psychomotor Activity: Normal   Assets  Assets: Communication Skills; Desire for Improvement; Resilience   Sleep  Sleep: Sleep: Fair  Estimated Sleeping Duration (Last 24 Hours): 15.75-16.75 hours  Nutritional Assessment (For OBS and [facility] admissions only) Has the patient had a weight loss or gain of 10 pounds or  more in the last 3 months?: Yes Has the patient had a decrease in food intake/or appetite?: Yes Does the patient have dental problems?: No Does the patient have eating habits or behaviors that may be indicators of an eating disorder including binging or inducing vomiting?: No Has the patient recently lost weight without trying?: 2 Has the patient been eating poorly because of a decreased appetite?: 1 Malnutrition Screening Tool Score: 3    Physical Exam  Physical Exam Constitutional:      General: He is not in acute distress.    Appearance: He is normal weight. He is not toxic-appearing.  HENT:     Head: Normocephalic and atraumatic.     Right Ear: External ear normal.     Left Ear: External ear normal.     Nose: Nose normal.     Mouth/Throat:     Mouth: Mucous membranes are dry.  Eyes:     Pupils: Pupils are equal, round, and reactive to light.  Pulmonary:     Effort: Pulmonary effort is normal.  Musculoskeletal:        General: Normal range of motion.     Cervical back: Normal range of motion.  Skin:    General: Skin is warm and dry.  Neurological:     General: No focal deficit present.     Mental Status: He is alert.    Review of Systems  Constitutional:  Positive for malaise/fatigue and weight loss. Negative for chills and fever.  HENT:  Negative for hearing loss.   Cardiovascular:  Negative for chest pain.  Gastrointestinal:  Positive for diarrhea. Negative for abdominal pain.  Musculoskeletal:  Positive for myalgias.  Neurological:  Negative for dizziness.  Psychiatric/Behavioral:  Positive for depression. Negative for hallucinations and suicidal ideas. The patient is not nervous/anxious and does not have insomnia.    Blood pressure 110/79, pulse 84, temperature 98.6 F (37 C), temperature source Oral, resp. rate 17, SpO2 100%. There is no height or weight on file to calculate BMI.  Treatment Plan Summary: Daily contact with patient to assess and evaluate  symptoms and progress in treatment and Medication management 33 year old male with a history of substance use including heroine and alcohol. He was discharged 3 days ago from inpatient psych and was taking Abilify , Sertraline  and Seroquel . He has a history of hearing diagnoses of Bipolar disorder and Schizophrenia. He says that  he hears voices daily. They are made worse when he is using substances and when he does not take psychiatric medication.     Alcohol and heroine dep Librium  and Clonidine  withdrawal protocols Review and order labs not ordered RPR, HIV and f/u results   Schizoaffective disorder Restart medication that he was recently discharged with. Abilify , Seroquel  and Sertraline  Titrate doses based on response Lipid and Hemoglobin A1C   TSH, due to worsening depression and rule out hypothyroidism Lipid, standard lab ordered for all patients received an antipsychotic therapy Ethanol, standard order for all patient reporting alcohol misuse or overuse Hemoglobin A1c, standard lab ordered for all patients received an antipsychotic therapy CBC, standing to rule out any infection, immunosuppression or anemia CMP, rule out metabolic conditions which may be contributing to mental health symptoms PRNS -Start Hydroxyzine  25 mg TID PRN for anxiety -Continue Tylenol  650 mg every 6 hours PRN for mild pain -Continue Maalox 30 mg every 4 hrs PRN for indigestion -Continue Milk of Magnesia as needed every 6 hrs for constipation -Continue Agitation protocol meds as per the MAR-See [facility] for details   F/u on EKG result  Dispo to residential rehabilitation.  Garvin JINNY Gaines, MD 05/11/2024 8:29 AM

## 2024-05-11 NOTE — ED Notes (Signed)
Pt asleep with even and unlabored respirations. No distress/discomfort noted. Pt remains safe on unit. 

## 2024-05-11 NOTE — ED Notes (Signed)
 Patient is in the bedroom calm and sleeping with eyes closed. NAD Respirations even and unlabored. Will monitor for safety.

## 2024-05-12 DIAGNOSIS — F259 Schizoaffective disorder, unspecified: Secondary | ICD-10-CM | POA: Diagnosis not present

## 2024-05-12 DIAGNOSIS — F1010 Alcohol abuse, uncomplicated: Secondary | ICD-10-CM | POA: Diagnosis not present

## 2024-05-12 DIAGNOSIS — F1120 Opioid dependence, uncomplicated: Secondary | ICD-10-CM | POA: Diagnosis not present

## 2024-05-12 MED ORDER — GUAIFENESIN ER 600 MG PO TB12
600.0000 mg | ORAL_TABLET | Freq: Two times a day (BID) | ORAL | Status: DC
  Administered 2024-05-12: 600 mg via ORAL
  Filled 2024-05-12: qty 1

## 2024-05-12 NOTE — ED Notes (Signed)
 Patient is in the bedroom calm and sleeping with eyes closed. NAD.

## 2024-05-12 NOTE — ED Notes (Signed)
 PRN Tylenol  given due to patient reports of generalized pain rating 6/10. Medication administered with no complications. Environment secured, safety checks in place per facility policy.

## 2024-05-12 NOTE — ED Provider Notes (Signed)
 FBC/OBS ASAP Discharge Summary  Date and Time: 05/12/2024 12:18 PM  Name: Mark Hammond  MRN:  [id number]   Discharge Diagnoses:  Final diagnoses:  Heroin dependence (HCC)  Alcohol abuse  Schizoaffective disorder, unspecified type (HCC)    Subjective: Patient reports that his mood is irritable. He says he had a good day yesterday. His sleep was good. He is still hurting and he didn't take his meds last night because the nurse wanted him to walk down the hall and he couldn't at that time. Appetite he says is good. I wish I could leave today. He wants to go to the beach.   Stay Summary: Patient was admitted for monitoring and treatment of withdrawal symptoms. His course was uncomplicated. He continued to complain of pain. He had minimal participation in unit activities. He wished to leave early against medical advise.   Total Time spent with patient: 20 minutes  Past Psychiatric History: He has been psychiatrically hospitalized 4-6 times and was first diagnosed Bipolar at age 38. He started using substances at age 7-16. His mother was murdered when he was 76 years old. He says he has never grieved this loss. He did not finish HS. 1 suicide attempt. Fentanyl  OD went to ICU. He has had 7 unintentional Ods on Fentanyl  in the last year.   Past Medical History: Pt reports he had hip surgery due to cellulitis,  Alcohol gastritis, Dental abscess, HTN Family History: Aunt mother and grandmother depression. Mother deceased-murdered Social History: His mother was murdered when he was 77 years old. He says he has never grieved this loss. He did not finish HS. He was expelled in 10th grade due to drawing graffiti in the bathroom. 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 Single, never married, no children.  Living situation: Living with girlfriend of 3 days in a hotel.  Uncle, Aunt and GM has connection with.  Tobacco Cessation:  Prescription not provided because: pt left before this could be  provided  Current Medications:  Current Facility-Administered Medications  Medication Dose Route Frequency Provider Last Rate Last Admin   acetaminophen  (TYLENOL ) tablet 650 mg  650 mg Oral Q6H PRN Claron Rosencrans J, MD   650 mg at 05/12/24 1017   alum & mag hydroxide-simeth (MAALOX/MYLANTA) 200-200-20 MG/5ML suspension 30 mL  30 mL Oral Q4H PRN Hobson, Fran E, NP       alum & mag hydroxide-simeth (MAALOX/MYLANTA) 200-200-20 MG/5ML suspension 30 mL  30 mL Oral Q4H PRN Christena Sunderlin J, MD       ARIPiprazole  (ABILIFY ) tablet 5 mg  5 mg Oral Daily Kavon Valenza J, MD   5 mg at 05/12/24 1012   chlordiazePOXIDE  (LIBRIUM ) capsule 25 mg  25 mg Oral Q6H PRN Mishon Blubaugh J, MD       chlordiazePOXIDE  (LIBRIUM ) capsule 25 mg  25 mg Oral TID Emmi Wertheim J, MD   25 mg at 05/12/24 1015   Followed by   chlordiazePOXIDE  (LIBRIUM ) capsule 25 mg  25 mg Oral BH-qamhs Hazleigh Mccleave J, MD       Followed by   NOREEN ON 05/13/2024] chlordiazePOXIDE  (LIBRIUM ) capsule 25 mg  25 mg Oral Daily Heaton Sarin J, MD       cloNIDine  (CATAPRES ) tablet 0.1 mg  0.1 mg Oral QID Lawrnce, Dagen Beevers J, MD   0.1 mg at 05/12/24 1014   Followed by   cloNIDine  (CATAPRES ) tablet 0.1 mg  0.1 mg Oral BH-qamhs Mekaila Tarnow J, MD  Followed by   NOREEN ON 05/15/2024] cloNIDine  (CATAPRES ) tablet 0.1 mg  0.1 mg Oral QAC breakfast Sandeep Delagarza J, MD       dicyclomine  (BENTYL ) tablet 20 mg  20 mg Oral Q6H PRN Desmin Daleo J, MD       haloperidol  (HALDOL ) tablet 5 mg  5 mg Oral TID PRN Hobson, Fran E, NP       And   diphenhydrAMINE  (BENADRYL ) capsule 50 mg  50 mg Oral TID PRN Hobson, Fran E, NP       haloperidol  lactate (HALDOL ) injection 5 mg  5 mg Intramuscular TID PRN Lawrnce Garvin PARAS, MD       And   diphenhydrAMINE  (BENADRYL ) injection 50 mg  50 mg Intramuscular TID PRN Klara Stjames J, MD       haloperidol  lactate (HALDOL ) injection 10 mg  10 mg Intramuscular TID PRN Shellye Zandi J, MD        And   diphenhydrAMINE  (BENADRYL ) injection 50 mg  50 mg Intramuscular TID PRN Devantae Babe J, MD       guaiFENesin  (MUCINEX ) 12 hr tablet 600 mg  600 mg Oral BID Jerriann Schrom J, MD   600 mg at 05/12/24 1015   hydrOXYzine  (ATARAX ) tablet 25 mg  25 mg Oral Q6H PRN Arita Severtson J, MD   25 mg at 05/11/24 2151   loperamide  (IMODIUM ) capsule 2-4 mg  2-4 mg Oral PRN Colbin Jovel J, MD       magnesium  hydroxide (MILK OF MAGNESIA) suspension 30 mL  30 mL Oral Daily PRN Hobson, Fran E, NP       magnesium  hydroxide (MILK OF MAGNESIA) suspension 30 mL  30 mL Oral Daily PRN Caroll Weinheimer J, MD       methocarbamol  (ROBAXIN ) tablet 500 mg  500 mg Oral Q8H PRN Zayne Draheim J, MD       multivitamin with minerals tablet 1 tablet  1 tablet Oral Daily Alexyia Guarino J, MD   1 tablet at 05/11/24 1209   multivitamin with minerals tablet 1 tablet  1 tablet Oral Daily Breanda Greenlaw J, MD   1 tablet at 05/12/24 1012   naproxen  (NAPROSYN ) tablet 500 mg  500 mg Oral BID PRN Annice Jolly J, MD       nicotine  (NICODERM CQ  - dosed in mg/24 hours) patch 21 mg  21 mg Transdermal Q0600 Kamber Vignola J, MD   21 mg at 05/12/24 1018   OLANZapine  zydis (ZYPREXA ) disintegrating tablet 5 mg  5 mg Oral TID PRN Ponce Skillman J, MD       ondansetron  (ZOFRAN -ODT) disintegrating tablet 4 mg  4 mg Oral Q6H PRN Irish Breisch J, MD       QUEtiapine  (SEROQUEL ) tablet 50 mg  50 mg Oral QHS Illona Bulman J, MD   50 mg at 05/11/24 2151   sertraline  (ZOLOFT ) tablet 50 mg  50 mg Oral Daily Aleah Ahlgrim J, MD   50 mg at 05/12/24 1015   thiamine  (VITAMIN B1) injection 100 mg  100 mg Intramuscular Once Simone Rodenbeck J, MD       Current Outpatient Medications  Medication Sig Dispense Refill   ARIPiprazole  (ABILIFY ) 5 MG tablet Take 5 mg by mouth daily.     hydrOXYzine  (ATARAX ) 25 MG tablet Take 25 mg by mouth every 6 (six) hours as needed for anxiety.     QUEtiapine  (SEROQUEL ) 50 MG tablet Take 50 mg  by mouth at bedtime.  PTA Medications:  Facility Ordered Medications  Medication   alum & mag hydroxide-simeth (MAALOX/MYLANTA) 200-200-20 MG/5ML suspension 30 mL   magnesium  hydroxide (MILK OF MAGNESIA) suspension 30 mL   haloperidol  (HALDOL ) tablet 5 mg   And   diphenhydrAMINE  (BENADRYL ) capsule 50 mg   acetaminophen  (TYLENOL ) tablet 650 mg   alum & mag hydroxide-simeth (MAALOX/MYLANTA) 200-200-20 MG/5ML suspension 30 mL   magnesium  hydroxide (MILK OF MAGNESIA) suspension 30 mL   OLANZapine  zydis (ZYPREXA ) disintegrating tablet 5 mg   haloperidol  lactate (HALDOL ) injection 5 mg   And   diphenhydrAMINE  (BENADRYL ) injection 50 mg   haloperidol  lactate (HALDOL ) injection 10 mg   And   diphenhydrAMINE  (BENADRYL ) injection 50 mg   nicotine  (NICODERM CQ  - dosed in mg/24 hours) patch 21 mg   thiamine  (VITAMIN B1) injection 100 mg   multivitamin with minerals tablet 1 tablet   chlordiazePOXIDE  (LIBRIUM ) capsule 25 mg   dicyclomine  (BENTYL ) tablet 20 mg   hydrOXYzine  (ATARAX ) tablet 25 mg   loperamide  (IMODIUM ) capsule 2-4 mg   methocarbamol  (ROBAXIN ) tablet 500 mg   naproxen  (NAPROSYN ) tablet 500 mg   ondansetron  (ZOFRAN -ODT) disintegrating tablet 4 mg   cloNIDine  (CATAPRES ) tablet 0.1 mg   Followed by   cloNIDine  (CATAPRES ) tablet 0.1 mg   Followed by   NOREEN ON 05/15/2024] cloNIDine  (CATAPRES ) tablet 0.1 mg   [COMPLETED] chlordiazePOXIDE  (LIBRIUM ) capsule 25 mg   Followed by   chlordiazePOXIDE  (LIBRIUM ) capsule 25 mg   Followed by   chlordiazePOXIDE  (LIBRIUM ) capsule 25 mg   Followed by   NOREEN ON 05/13/2024] chlordiazePOXIDE  (LIBRIUM ) capsule 25 mg   multivitamin with minerals tablet 1 tablet   QUEtiapine  (SEROQUEL ) tablet 50 mg   ARIPiprazole  (ABILIFY ) tablet 5 mg   [COMPLETED] sertraline  (ZOLOFT ) tablet 25 mg   sertraline  (ZOLOFT ) tablet 50 mg   guaiFENesin  (MUCINEX ) 12 hr tablet 600 mg       05/12/2024   12:00 PM 05/11/2024    2:58 PM 01/17/2024    9:24 AM   Depression screen PHQ 2/9  Decreased Interest 2 2 0  Down, Depressed, Hopeless 2 2 0  PHQ - 2 Score 4 4 0  Altered sleeping 3 3   Tired, decreased energy 2 2   Change in appetite 1 1   Feeling bad or failure about yourself  2 2   Trouble concentrating 0 0   Moving slowly or fidgety/restless 0 0   Suicidal thoughts 0 0   PHQ-9 Score 12 12   Difficult doing work/chores Somewhat difficult Somewhat difficult     Flowsheet Row ED from 05/10/2024 in [facility] Most recent reading at 05/10/2024 10:06 AM ED from 05/10/2024 in [facility] Most recent reading at 05/10/2024  7:38 AM ED from 05/09/2024 in [facility] Emergency Department at [facility] Most recent reading at 05/09/2024  7:24 AM  C-SSRS RISK CATEGORY Moderate Risk Moderate Risk Error: Q3, 4, or 5 should not be populated when Q2 is No    Musculoskeletal  Strength & Muscle Tone: within normal limits Gait & Station: uses a walker Patient leans: Left  Psychiatric Specialty Exam  Presentation  General Appearance:  Casual; Appropriate for Environment (lying in bed with eyes closed mostly shirtless and a blanket)  Eye Contact: Minimal  Speech: Other (comment) (minimal responses to questions)  Speech Volume: Decreased  Handedness: Right   Mood and Affect  Mood: Irritable  Affect: Congruent   Thought Process  Thought Processes:  Coherent  Descriptions of Associations:Intact  Orientation:Full (Time, Place and Person)  Thought Content:Logical  Diagnosis of Schizophrenia or Schizoaffective disorder in past: Yes  Duration of Psychotic Symptoms: Greater than six months   Hallucinations:Hallucinations: Auditory Description of Auditory Hallucinations: constant  Ideas of Reference:None  Suicidal Thoughts:Suicidal Thoughts: No SI Passive Intent and/or Plan: Without Intent  Homicidal Thoughts:Homicidal Thoughts: No   Sensorium  Memory: Recent Good;  Immediate Good; Remote Good  Judgment: Fair  Insight: Poor; Lacking   Executive Functions  Concentration: Good  Attention Span: Good  Recall: Good  Fund of Knowledge: Good  Language: Good   Psychomotor Activity  Psychomotor Activity: Psychomotor Activity: Normal   Assets  Assets: Communication Skills   Sleep  Sleep: Sleep: Good  Estimated Sleeping Duration (Last 24 Hours): 8.50-10.75 hours  No data recorded  Physical Exam  Physical Exam HENT:     Head: Normocephalic and atraumatic.     Right Ear: External ear normal.     Left Ear: External ear normal.     Nose: Nose normal.     Mouth/Throat:     Mouth: Mucous membranes are moist.  Eyes:     Pupils: Pupils are equal, round, and reactive to light.  Pulmonary:     Effort: Pulmonary effort is normal.  Musculoskeletal:        General: Normal range of motion.     Cervical back: Normal range of motion.  Skin:    General: Skin is warm and dry.  Neurological:     General: No focal deficit present.     Mental Status: He is alert.    Review of Systems  Constitutional:  Negative for chills and fever.  HENT:  Negative for hearing loss.   Eyes:  Negative for blurred vision.  Respiratory:  Negative for cough.   Cardiovascular:  Negative for chest pain and palpitations.  Gastrointestinal:  Negative for abdominal pain, constipation, diarrhea, heartburn, nausea and vomiting.  Musculoskeletal:  Positive for myalgias.  Neurological:  Negative for dizziness and headaches.  Psychiatric/Behavioral:  Negative for depression, hallucinations, memory loss, substance abuse and suicidal ideas. The patient is not nervous/anxious and does not have insomnia.    Blood pressure 94/60, pulse 61, temperature 97.7 F (36.5 C), resp. rate 18, SpO2 99%. There is no height or weight on file to calculate BMI.  Demographic Factors:  Male, Caucasian, Low socioeconomic status, and Unemployed  Loss Factors: Decrease in  vocational status, Decline in physical health, and Financial problems/change in socioeconomic status  Historical Factors: Prior suicide attempts, Family history of mental illness or substance abuse, and Impulsivity  Risk Reduction Factors:   Living with another person, especially a relative  Continued Clinical Symptoms:  Severe Anxiety and/or Agitation Bipolar Disorder:   Depressive phase Alcohol/Substance Abuse/Dependencies Schizophrenia:   Depressive state Less than 43 years old Chronic Pain More than one psychiatric diagnosis Unstable or Poor Therapeutic Relationship Previous Psychiatric Diagnoses and Treatments Medical Diagnoses and Treatments/Surgeries  Cognitive Features That Contribute To Risk:  Closed-mindedness, Polarized thinking, and Thought constriction (tunnel vision)    Suicide Risk:  High  Plan Of Care/Follow-up recommendations:  34 year old male with a history of substance use including heroine and alcohol. He has a history of intentional and unintentional drug overdoses. He was discharged 3 days ago from inpatient psych and was taking Abilify , Sertraline  and Seroquel . He has a history of diagnoses of Bipolar disorder and Schizophrenia. He says that he hears voices daily. They are made worse when he  is using substances and when he does not take psychiatric medication.     Alcohol and heroine dep Librium  and Clonidine  withdrawal protocols Uncomplicated course of withdrawal.   Schizoaffective disorder Restart medication that he was recently discharged with. Abilify , Seroquel  and Sertraline  Titrate doses based on response Lipid and Hemoglobin A1C    EKG result Qtc 468   Dispo to residential rehabilitation. Patient wishes to leave today against medical advice. Because pt has chronic high risk suicidal behavior and frequent admissions and is without current SI or plan the writer did not believe that patient was at imminent risk to himself or others but remains  chronically at a high risk of accidental and intentional harm to himself.  Disposition: Pt is leaving AMA. He is connected to outpatient treatment.  Garvin JINNY Gaines, MD 05/12/2024, 12:18 PM

## 2024-05-12 NOTE — ED Notes (Signed)
 Patient left unit AMA. Patient belongings returned from locker #21 complete and intact. Patient denied SI/HI/AVH upon discharge. No acute distress noted. Patient mood good, thanked staff for the care he has received. Patient voiced hope for future and for upcoming trip to [facility] with uncle. Patient walked to lobby via staff. Safety maintained.

## 2024-05-12 NOTE — ED Notes (Addendum)
 Patient asleep, no CIWA or COWS assessment able to be completed at this time.

## 2024-05-12 NOTE — ED Notes (Signed)
 Patient resting with eyes closed in no apparent acute distress. Respirations even and unlabored. Environment secured. Safety checks in place according to facility policy.

## 2024-05-12 NOTE — Group Note (Unsigned)
 Group Topic: Communication  Group Date: 05/12/2024 Start Time: 0830 End Time: 0900 Facilitators: Laneta Renea POUR, NT  Department: [facility]  Number of Participants: 5  Group Focus: communication Treatment Modality:  Psychoeducation Interventions utilized were problem solving Purpose: improve communication skills   Name: Mark Hammond Date of Birth: 28-Nov-1990  MR: [id number]    Level of Participation: {THERAPIES; PSYCH GROUP PARTICIPATION OZCZO:76008} Quality of Participation: {THERAPIES; PSYCH QUALITY OF PARTICIPATION:23992} Interactions with others: {THERAPIES; PSYCH INTERACTIONS:23993} Mood/Affect: {THERAPIES; PSYCH MOOD/AFFECT:23994} Triggers (if applicable): *** Cognition: {THERAPIES; PSYCH COGNITION:23995} Progress: {THERAPIES; PSYCH PROGRESS:23997} Response: *** Plan: {THERAPIES; PSYCH EOJW:76003}  Patients Problems:  Patient Active Problem List   Diagnosis Date Noted   Schizoaffective disorder, bipolar type (HCC) 05/10/2024   Polysubstance use disorder 04/28/2024   Cocaine  use disorder, severe, dependence (HCC) 03/31/2024   Osteomyelitis of left hip (HCC) 02/22/2024   Cellulitis 02/20/2024   Severe sepsis (HCC) 01/31/2024   Hypokalemia 01/31/2024   Chronic alcohol abuse 01/31/2024   Anemia of chronic disease 01/31/2024   Transaminitis 01/31/2024   Bipolar 1 disorder (HCC)    Amphetamine use disorder, 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   Polysubstance dependence (HCC) 12/13/2023   Polysubstance abuse (HCC) 11/21/2023   Alcohol use disorder 08/04/2023   Stimulant use disorder 08/04/2023   Heroin use disorder, severe (HCC) 08/04/2023   Tobacco use disorder 08/04/2023   Cannabis use disorder 08/04/2023   Hepatitis C 03/01/2023   Anxiety state 01/14/2023   Insomnia 01/14/2023   Schizoaffective disorder (HCC) 01/13/2023   Schizophrenia (HCC) 11/15/2019   Substance induced mood disorder (HCC) 10/24/2019   Methamphetamine use disorder, 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

## 2024-05-12 NOTE — ED Notes (Signed)
 Patient alert & oriented x4. Denies intent to harm self or others when asked. Denies A/VH. Patient reported generalized pain rating a 6/10. PRN Tyelnol given, pain reassessed to be at 2/10. Patient currently showering. Hygiene supplies, fresh scrubs, and shower chair provided. Patient will be leaving AMA after shower per patient and provider. No acute distress noted. Scheduled medications administered with no complications. Support and encouragement provided. Patient observed in milieu. No inappropriate behaviors observed or reported. Routine safety checks conducted per facility protocol. Encouraged patient to notify staff if any thoughts of harm towards self or others arise. Patient verbalizes understanding and agreement.

## 2024-05-12 NOTE — ED Provider Notes (Signed)
 Behavioral Health Progress Note  Date and Time: 05/12/2024 12:15 PM Name: Mark Hammond MRN:  [id number]  Subjective:  Patient reports that his mood is irritable. He says he had a good day yesterday.  His sleep was good. He is still hurting and he didn't take his meds last night because the nurse wanted him to walk down the hall and he couldn't at that time. Appetite he says is good. I wish I could leave today. He wants to go to the beach.    Diagnosis:  Final diagnoses:  Heroin dependence (HCC)  Alcohol abuse  Schizoaffective disorder, unspecified type (HCC)    Total Time spent with patient: 20 minutes  Past Psychiatric History: He has been psychiatrically hospitalized 4-6 times and was first diagnosed Bipolar at age 35. He started using substances at age 58-16. His mother was murdered when he was 29 years old. He says he has never grieved this loss. He did not finish HS. 1 suicide attempt. Fentanyl  OD went to ICU. He has had 7 unintentional Ods on Fentanyl  in the last year.   Past Medical History: Pt reports he had hip surgery due to cellulitis,  Alcohol gastritis, Dental abscess, HTN Family History: Aunt mother and grandmother depression. Mother deceased-murdered Social History: His mother was murdered when he was 60 years old. He says he has never grieved this loss. He did not finish HS. He was expelled in 10th grade due to drawing graffiti in the bathroom. 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 Single, never married, no children.  Living situation: Living with girlfriend of 3 days in a hotel.  Uncle, Aunt and GM has connection with.    Sleep: Good  Appetite:  Good  Current Medications:  Current Facility-Administered Medications  Medication Dose Route Frequency Provider Last Rate Last Admin   acetaminophen  (TYLENOL ) tablet 650 mg  650 mg Oral Q6H PRN Dmiyah Liscano J, MD   650 mg at 05/12/24 1017   alum & mag hydroxide-simeth (MAALOX/MYLANTA) 200-200-20 MG/5ML  suspension 30 mL  30 mL Oral Q4H PRN Hobson, Fran E, NP       alum & mag hydroxide-simeth (MAALOX/MYLANTA) 200-200-20 MG/5ML suspension 30 mL  30 mL Oral Q4H PRN Lyncoln Maskell J, MD       ARIPiprazole  (ABILIFY ) tablet 5 mg  5 mg Oral Daily Delara Shepheard J, MD   5 mg at 05/12/24 1012   chlordiazePOXIDE  (LIBRIUM ) capsule 25 mg  25 mg Oral Q6H PRN Thelonious Kauffmann J, MD       chlordiazePOXIDE  (LIBRIUM ) capsule 25 mg  25 mg Oral TID Murice Barbar J, MD   25 mg at 05/12/24 1015   Followed by   chlordiazePOXIDE  (LIBRIUM ) capsule 25 mg  25 mg Oral BH-qamhs Sosie Gato J, MD       Followed by   NOREEN ON 05/13/2024] chlordiazePOXIDE  (LIBRIUM ) capsule 25 mg  25 mg Oral Daily Emeric Novinger J, MD       cloNIDine  (CATAPRES ) tablet 0.1 mg  0.1 mg Oral QID Lawrnce, Raiza Kiesel J, MD   0.1 mg at 05/12/24 1014   Followed by   cloNIDine  (CATAPRES ) tablet 0.1 mg  0.1 mg Oral BH-qamhs Renel Ende J, MD       Followed by   NOREEN ON 05/15/2024] cloNIDine  (CATAPRES ) tablet 0.1 mg  0.1 mg Oral QAC breakfast Jossilyn Benda J, MD       dicyclomine  (BENTYL ) tablet 20 mg  20 mg Oral Q6H PRN Kaiyu Mirabal J, MD  haloperidol  (HALDOL ) tablet 5 mg  5 mg Oral TID PRN Hobson, Fran E, NP       And   diphenhydrAMINE  (BENADRYL ) capsule 50 mg  50 mg Oral TID PRN Hobson, Fran E, NP       haloperidol  lactate (HALDOL ) injection 5 mg  5 mg Intramuscular TID PRN Theia Dezeeuw J, MD       And   diphenhydrAMINE  (BENADRYL ) injection 50 mg  50 mg Intramuscular TID PRN Samaria Anes J, MD       haloperidol  lactate (HALDOL ) injection 10 mg  10 mg Intramuscular TID PRN Lawrnce Garvin PARAS, MD       And   diphenhydrAMINE  (BENADRYL ) injection 50 mg  50 mg Intramuscular TID PRN Rawlins Stuard J, MD       guaiFENesin  (MUCINEX ) 12 hr tablet 600 mg  600 mg Oral BID Lawrnce, Victor Granados J, MD   600 mg at 05/12/24 1015   hydrOXYzine  (ATARAX ) tablet 25 mg  25 mg Oral Q6H PRN Tanzania Basham J, MD   25 mg at 05/11/24  2151   loperamide  (IMODIUM ) capsule 2-4 mg  2-4 mg Oral PRN Wanette Robison J, MD       magnesium  hydroxide (MILK OF MAGNESIA) suspension 30 mL  30 mL Oral Daily PRN Hobson, Fran E, NP       magnesium  hydroxide (MILK OF MAGNESIA) suspension 30 mL  30 mL Oral Daily PRN Garrette Caine J, MD       methocarbamol  (ROBAXIN ) tablet 500 mg  500 mg Oral Q8H PRN Sava Proby J, MD       multivitamin with minerals tablet 1 tablet  1 tablet Oral Daily Jewels Langone J, MD   1 tablet at 05/11/24 1209   multivitamin with minerals tablet 1 tablet  1 tablet Oral Daily Edilia Ghuman J, MD   1 tablet at 05/12/24 1012   naproxen  (NAPROSYN ) tablet 500 mg  500 mg Oral BID PRN Jejuan Scala J, MD       nicotine  (NICODERM CQ  - dosed in mg/24 hours) patch 21 mg  21 mg Transdermal Q0600 Carlei Huang J, MD   21 mg at 05/12/24 1018   OLANZapine  zydis (ZYPREXA ) disintegrating tablet 5 mg  5 mg Oral TID PRN Nicolina Hirt J, MD       ondansetron  (ZOFRAN -ODT) disintegrating tablet 4 mg  4 mg Oral Q6H PRN Feliza Diven J, MD       QUEtiapine  (SEROQUEL ) tablet 50 mg  50 mg Oral QHS Lorance Pickeral J, MD   50 mg at 05/11/24 2151   sertraline  (ZOLOFT ) tablet 50 mg  50 mg Oral Daily Dennise Raabe J, MD   50 mg at 05/12/24 1015   thiamine  (VITAMIN B1) injection 100 mg  100 mg Intramuscular Once Brad Mcgaughy J, MD       Current Outpatient Medications  Medication Sig Dispense Refill   ARIPiprazole  (ABILIFY ) 5 MG tablet Take 5 mg by mouth daily.     hydrOXYzine  (ATARAX ) 25 MG tablet Take 25 mg by mouth every 6 (six) hours as needed for anxiety.     QUEtiapine  (SEROQUEL ) 50 MG tablet Take 50 mg by mouth at bedtime.      Labs  Lab Results:  Admission on 05/10/2024, Discharged on 05/12/2024  Component Date Value Ref Range Status   RPR Ser Ql 05/11/2024 NON REACTIVE  NON REACTIVE Final   Performed at [facility], [address] [place], [place] [address]   Hepatitis B Surface  Ag 05/11/2024  NON REACTIVE  NON REACTIVE Final   HCV Ab 05/11/2024 Reactive (A)  NON REACTIVE Final   Comment: (NOTE) The CDC recommends that a Reactive HCV antibody result be followed up  with a HCV Nucleic Acid Amplification test.     Hep A IgM 05/11/2024 NON REACTIVE  NON REACTIVE Final   Hep B C IgM 05/11/2024 NON REACTIVE  NON REACTIVE Final   Performed at [facility], [address] [place], [place] [address]   HIV Screen 4th Generation wRfx 05/11/2024 Non Reactive  Non Reactive Final   Performed at [facility], [address], [place], [place] [address]   TSH 05/11/2024 0.752  0.350 - 4.500 uIU/mL Final   Comment: Performed by a 3rd Generation assay with a functional sensitivity of <=0.01 uIU/mL. Performed at [facility], [address] [place], [place] [address]   Admission on 05/10/2024, Discharged on 05/10/2024  Component Date Value Ref Range Status   WBC 05/10/2024 8.3  4.0 - 10.5 K/uL Final   RBC 05/10/2024 4.14 (L)  4.22 - 5.81 MIL/uL Final   Hemoglobin 05/10/2024 10.9 (L)  13.0 - 17.0 g/dL Final   HCT 91/91/7974 33.7 (L)  39.0 - 52.0 % Final   MCV 05/10/2024 81.4  80.0 - 100.0 fL Final   MCH 05/10/2024 26.3  26.0 - 34.0 pg Final   MCHC 05/10/2024 32.3  30.0 - 36.0 g/dL Final   RDW 91/91/7974 14.1  11.5 - 15.5 % Final   Platelets 05/10/2024 302  150 - 400 K/uL Final   nRBC 05/10/2024 0.0  0.0 - 0.2 % Final   Neutrophils Relative % 05/10/2024 51  % Final   Neutro Abs 05/10/2024 4.2  1.7 - 7.7 K/uL Final   Lymphocytes Relative 05/10/2024 37  % Final   Lymphs Abs 05/10/2024 3.1  0.7 - 4.0 K/uL Final   Monocytes Relative 05/10/2024 10  % Final   Monocytes Absolute 05/10/2024 0.8  0.1 - 1.0 K/uL Final   Eosinophils Relative 05/10/2024 1  % Final   Eosinophils Absolute 05/10/2024 0.1  0.0 - 0.5 K/uL Final   Basophils Relative 05/10/2024 1  % Final   Basophils Absolute 05/10/2024 0.1  0.0 - 0.1 K/uL Final   Immature Granulocytes 05/10/2024 0  % Final   Abs  Immature Granulocytes 05/10/2024 0.03  0.00 - 0.07 K/uL Final   Performed at [facility], [address], [place], [place] [address]   Sodium 05/10/2024 138  135 - 145 mmol/L Final   Potassium 05/10/2024 3.9  3.5 - 5.1 mmol/L Final   Chloride 05/10/2024 101  98 - 111 mmol/L Final   CO2 05/10/2024 24  22 - 32 mmol/L Final   Glucose, Bld 05/10/2024 83  70 - 99 mg/dL Final   Glucose reference range applies only to samples taken after fasting for at least 8 hours.   BUN 05/10/2024 <5 (L)  6 - 20 mg/dL Final   Creatinine, Ser 05/10/2024 0.57 (L)  0.61 - 1.24 mg/dL Final   Calcium 91/91/7974 8.8 (L)  8.9 - 10.3 mg/dL Final   Total Protein 91/91/7974 7.7  6.5 - 8.1 g/dL Final   Albumin  05/10/2024 3.5  3.5 - 5.0 g/dL Final   AST 91/91/7974 87 (H)  15 - 41 U/L Final   ALT 05/10/2024 80 (H)  0 - 44 U/L Final   Alkaline Phosphatase 05/10/2024 121  38 - 126 U/L Final   Total Bilirubin  05/10/2024 0.4  0.0 - 1.2 mg/dL Final   GFR, Estimated 05/10/2024 >60  >60 mL/min Final   Comment: (NOTE) Calculated using the CKD-EPI Creatinine Equation (2021)    Anion gap 05/10/2024 13  5 - 15 Final   Performed at [facility], [address] [place], [place] [address]   Alcohol, Ethyl (B) 05/10/2024 126 (H)  <15 mg/dL Final   Comment: (NOTE) For medical purposes only. Performed at [facility], [address] [place], [place] [address]    Cholesterol 05/10/2024 125  0 - 200 mg/dL Final   Triglycerides 91/91/7974 49  <150 mg/dL Final   HDL 91/91/7974 48  >40 mg/dL Final   Total CHOL/HDL Ratio 05/10/2024 2.6  RATIO Final   VLDL 05/10/2024 10  0 - 40 mg/dL Final   LDL Cholesterol 05/10/2024 67  0 - 99 mg/dL Final   Comment:        Total Cholesterol/HDL:CHD Risk Coronary Heart Disease Risk Table                     Men   Women  1/2 Average Risk   3.4   3.3  Average Risk       5.0   4.4  2 X Average Risk   9.6   7.1  3 X Average Risk  23.4   11.0        Use the calculated Patient  Ratio above and the CHD Risk Table to determine the patient's CHD Risk.        ATP III CLASSIFICATION (LDL):  <100     mg/dL   Optimal  899-870  mg/dL   Near or Above                    Optimal  130-159  mg/dL   Borderline  839-810  mg/dL   High  >809     mg/dL   Very High Performed at [facility], [address] [place], [place] [address]    POC Amphetamine UR 05/10/2024 None Detected  NONE DETECTED (Cut Off Level 1000 ng/mL) Final   POC Secobarbital (BAR) 05/10/2024 None Detected  NONE DETECTED (Cut Off Level 300 ng/mL) Final   POC Buprenorphine (BUP) 05/10/2024 None Detected  NONE DETECTED (Cut Off Level 10 ng/mL) Final   POC Oxazepam (BZO) 05/10/2024 None Detected  NONE DETECTED (Cut Off Level 300 ng/mL) Final   POC Cocaine  UR 05/10/2024 Positive (A)  NONE DETECTED (Cut Off Level 300 ng/mL) Final   POC Methamphetamine UR 05/10/2024 Positive (A)  NONE DETECTED (Cut Off Level 1000 ng/mL) Final   POC Morphine  05/10/2024 None Detected  NONE DETECTED (Cut Off Level 300 ng/mL) Final   POC Methadone UR 05/10/2024 None Detected  NONE DETECTED (Cut Off Level 300 ng/mL) Final   POC Oxycodone  UR 05/10/2024 None Detected  NONE DETECTED (Cut Off Level 100 ng/mL) Final   POC Marijuana UR 05/10/2024 None Detected  NONE DETECTED (Cut Off Level 50 ng/mL) Final  Admission on 05/09/2024, Discharged on 05/09/2024  Component Date Value Ref Range Status   Glucose-Capillary 05/09/2024 122 (H)  70 - 99 mg/dL Final   Glucose reference range applies only to samples taken after fasting for at least 8 hours.  Admission on 04/27/2024, Discharged on 04/28/2024  Component Date Value Ref Range Status   WBC 04/27/2024 8.4  4.0 - 10.5 K/uL Final   RBC 04/27/2024 3.91 (L)  4.22 - 5.81 MIL/uL  Final   Hemoglobin 04/27/2024 10.3 (L)  13.0 - 17.0 g/dL Final   HCT 92/73/7974 32.3 (L)  39.0 - 52.0 % Final   MCV 04/27/2024 82.6  80.0 - 100.0 fL Final   MCH 04/27/2024 26.3  26.0 - 34.0 pg Final   MCHC 04/27/2024  31.9  30.0 - 36.0 g/dL Final   RDW 92/73/7974 13.5  11.5 - 15.5 % Final   Platelets 04/27/2024 299  150 - 400 K/uL Final   nRBC 04/27/2024 0.0  0.0 - 0.2 % Final   Neutrophils Relative % 04/27/2024 63  % Final   Neutro Abs 04/27/2024 5.4  1.7 - 7.7 K/uL Final   Lymphocytes Relative 04/27/2024 22  % Final   Lymphs Abs 04/27/2024 1.8  0.7 - 4.0 K/uL Final   Monocytes Relative 04/27/2024 13  % Final   Monocytes Absolute 04/27/2024 1.1 (H)  0.1 - 1.0 K/uL Final   Eosinophils Relative 04/27/2024 0  % Final   Eosinophils Absolute 04/27/2024 0.0  0.0 - 0.5 K/uL Final   Basophils Relative 04/27/2024 1  % Final   Basophils Absolute 04/27/2024 0.1  0.0 - 0.1 K/uL Final   Immature Granulocytes 04/27/2024 1  % Final   Abs Immature Granulocytes 04/27/2024 0.04  0.00 - 0.07 K/uL Final   Performed at [facility], [address], [place], [place] [address]   Sodium 04/27/2024 132 (L)  135 - 145 mmol/L Final   Potassium 04/27/2024 3.6  3.5 - 5.1 mmol/L Final   Chloride 04/27/2024 98  98 - 111 mmol/L Final   CO2 04/27/2024 25  22 - 32 mmol/L Final   Glucose, Bld 04/27/2024 128 (H)  70 - 99 mg/dL Final   Glucose reference range applies only to samples taken after fasting for at least 8 hours.   BUN 04/27/2024 13  6 - 20 mg/dL Final   Creatinine, Ser 04/27/2024 0.52 (L)  0.61 - 1.24 mg/dL Final   Calcium 92/73/7974 8.9  8.9 - 10.3 mg/dL Final   GFR, Estimated 04/27/2024 >60  >60 mL/min Final   Comment: (NOTE) Calculated using the CKD-EPI Creatinine Equation (2021)    Anion gap 04/27/2024 9  5 - 15 Final   Performed at [facility], [address] [place], [place] [address]   Alcohol, Ethyl (B) 04/27/2024 <15  <15 mg/dL Final   Comment: (NOTE) For medical purposes only. Performed at [facility], [address], [place], [place] [address]    Opiates 04/27/2024 POSITIVE (A)  NONE DETECTED Final   Cocaine  04/27/2024 POSITIVE (A)  NONE  DETECTED Final   Benzodiazepines 04/27/2024 NONE DETECTED  NONE DETECTED Final   Amphetamines 04/27/2024 NONE DETECTED  NONE DETECTED Final   Tetrahydrocannabinol 04/27/2024 POSITIVE (A)  NONE DETECTED Final   Barbiturates 04/27/2024 NONE DETECTED  NONE DETECTED Final   Comment: (NOTE) DRUG SCREEN FOR MEDICAL PURPOSES ONLY.  IF CONFIRMATION IS NEEDED FOR ANY PURPOSE, NOTIFY LAB WITHIN 5 DAYS.  LOWEST DETECTABLE LIMITS FOR URINE DRUG SCREEN Drug Class                     Cutoff (ng/mL) Amphetamine and metabolites    1000 Barbiturate and metabolites    200 Benzodiazepine                 200 Opiates and metabolites        300 Cocaine  and metabolites        300 THC  50 Performed at [facility], [address] [place], [place] [address]   Admission on 04/09/2024, Discharged on 04/10/2024  Component Date Value Ref Range Status   Sodium 04/09/2024 136  135 - 145 mmol/L Final   Potassium 04/09/2024 3.2 (L)  3.5 - 5.1 mmol/L Final   Chloride 04/09/2024 100  98 - 111 mmol/L Final   CO2 04/09/2024 22  22 - 32 mmol/L Final   Glucose, Bld 04/09/2024 94  70 - 99 mg/dL Final   Glucose reference range applies only to samples taken after fasting for at least 8 hours.   BUN 04/09/2024 <5 (L)  6 - 20 mg/dL Final   Creatinine, Ser 04/09/2024 0.59 (L)  0.61 - 1.24 mg/dL Final   Calcium 92/91/7974 8.7 (L)  8.9 - 10.3 mg/dL Final   Total Protein 92/91/7974 8.5 (H)  6.5 - 8.1 g/dL Final   Albumin  04/09/2024 4.0  3.5 - 5.0 g/dL Final   AST 92/91/7974 78 (H)  15 - 41 U/L Final   ALT 04/09/2024 33  0 - 44 U/L Final   Alkaline Phosphatase 04/09/2024 120  38 - 126 U/L Final   Total Bilirubin 04/09/2024 0.7  0.0 - 1.2 mg/dL Final   GFR, Estimated 04/09/2024 >60  >60 mL/min Final   Comment: (NOTE) Calculated using the CKD-EPI Creatinine Equation (2021)    Anion gap 04/09/2024 14  5 - 15 Final   Performed at [facility], [address], [place], [place] [address]   Alcohol, Ethyl (B) 04/09/2024 125 (H)  <15 mg/dL Final   Comment: (NOTE) For medical purposes only. Performed at [facility], [address] [place], [place] [address]    Opiates 04/09/2024 NONE DETECTED  NONE DETECTED Final   Cocaine  04/09/2024 POSITIVE (A)  NONE DETECTED Final   Benzodiazepines 04/09/2024 POSITIVE (A)  NONE DETECTED Final   Amphetamines 04/09/2024 POSITIVE (A)  NONE DETECTED Final   Comment: (NOTE) Trazodone  is metabolized in vivo to several metabolites, including pharmacologically active m-CPP, which is excreted in the urine. Immunoassay screens for amphetamines and MDMA have potential cross-reactivity with these compounds and may provide false positive  results.     Tetrahydrocannabinol 04/09/2024 NONE DETECTED  NONE DETECTED Final   Barbiturates 04/09/2024 NONE DETECTED  NONE DETECTED Final   Comment: (NOTE) DRUG SCREEN FOR MEDICAL PURPOSES ONLY.  IF CONFIRMATION IS NEEDED FOR ANY PURPOSE, NOTIFY LAB WITHIN 5 DAYS.  LOWEST DETECTABLE LIMITS FOR URINE DRUG SCREEN Drug Class                     Cutoff (ng/mL) Amphetamine and metabolites    1000 Barbiturate and metabolites    200 Benzodiazepine                 200 Opiates and metabolites        300 Cocaine  and metabolites        300 THC                            50 Performed at [facility], [address] [place], [place] [address]    WBC 04/09/2024 8.1  4.0 - 10.5 K/uL Final   RBC 04/09/2024 4.07 (L)  4.22 - 5.81 MIL/uL Final   Hemoglobin 04/09/2024 11.2 (L)  13.0 - 17.0 g/dL Final   HCT 92/91/7974 34.8 (L)  39.0 - 52.0 % Final   MCV 04/09/2024  85.5  80.0 - 100.0 fL Final   MCH 04/09/2024 27.5  26.0 - 34.0 pg Final   MCHC 04/09/2024 32.2  30.0 - 36.0 g/dL Final   RDW 92/91/7974 13.2  11.5 - 15.5 % Final   Platelets 04/09/2024 411 (H)  150 - 400 K/uL Final   nRBC 04/09/2024 0.0  0.0 - 0.2 % Final   Neutrophils Relative %  04/09/2024 60  % Final   Neutro Abs 04/09/2024 4.9  1.7 - 7.7 K/uL Final   Lymphocytes Relative 04/09/2024 30  % Final   Lymphs Abs 04/09/2024 2.4  0.7 - 4.0 K/uL Final   Monocytes Relative 04/09/2024 7  % Final   Monocytes Absolute 04/09/2024 0.6  0.1 - 1.0 K/uL Final   Eosinophils Relative 04/09/2024 1  % Final   Eosinophils Absolute 04/09/2024 0.1  0.0 - 0.5 K/uL Final   Basophils Relative 04/09/2024 2  % Final   Basophils Absolute 04/09/2024 0.1  0.0 - 0.1 K/uL Final   Immature Granulocytes 04/09/2024 0  % Final   Abs Immature Granulocytes 04/09/2024 0.02  0.00 - 0.07 K/uL Final   Performed at [facility], [address], [place], [place] [address]  Admission on 04/08/2024, Discharged on 04/08/2024  Component Date Value Ref Range Status   Sodium 04/08/2024 136  135 - 145 mmol/L Final   Potassium 04/08/2024 3.3 (L)  3.5 - 5.1 mmol/L Final   Chloride 04/08/2024 97 (L)  98 - 111 mmol/L Final   CO2 04/08/2024 22  22 - 32 mmol/L Final   Glucose, Bld 04/08/2024 96  70 - 99 mg/dL Final   Glucose reference range applies only to samples taken after fasting for at least 8 hours.   BUN 04/08/2024 5 (L)  6 - 20 mg/dL Final   Creatinine, Ser 04/08/2024 0.61  0.61 - 1.24 mg/dL Final   Calcium 92/92/7974 8.6 (L)  8.9 - 10.3 mg/dL Final   Total Protein 92/92/7974 7.9  6.5 - 8.1 g/dL Final   Albumin  04/08/2024 3.6  3.5 - 5.0 g/dL Final   AST 92/92/7974 71 (H)  15 - 41 U/L Final   ALT 04/08/2024 30  0 - 44 U/L Final   Alkaline Phosphatase 04/08/2024 100  38 - 126 U/L Final   Total Bilirubin 04/08/2024 0.5  0.0 - 1.2 mg/dL Final   GFR, Estimated 04/08/2024 >60  >60 mL/min Final   Comment: (NOTE) Calculated using the CKD-EPI Creatinine Equation (2021)    Anion gap 04/08/2024 17 (H)  5 - 15 Final   Performed at [facility], [address] [place], [place] [address]   Alcohol, Ethyl (B) 04/08/2024 100 (H)  <15 mg/dL Final   Comment: (NOTE) For medical purposes  only. Performed at [facility], [address], [place], [place] [address]    WBC 04/08/2024 6.8  4.0 - 10.5 K/uL Final   RBC 04/08/2024 4.14 (L)  4.22 - 5.81 MIL/uL Final   Hemoglobin 04/08/2024 11.6 (L)  13.0 - 17.0 g/dL Final   HCT 92/92/7974 35.0 (L)  39.0 - 52.0 % Final   MCV 04/08/2024 84.5  80.0 - 100.0 fL Final   MCH 04/08/2024 28.0  26.0 - 34.0 pg Final   MCHC 04/08/2024 33.1  30.0 - 36.0 g/dL Final   RDW 92/92/7974 13.2  11.5 - 15.5 % Final   Platelets 04/08/2024 407 (H)  150 - 400 K/uL Final   nRBC 04/08/2024 0.0  0.0 - 0.2 % Final  Performed at [facility], [address], [place], [place] [address]   Opiates 04/08/2024 NONE DETECTED  NONE DETECTED Final   Cocaine  04/08/2024 POSITIVE (A)  NONE DETECTED Final   Benzodiazepines 04/08/2024 POSITIVE (A)  NONE DETECTED Final   Amphetamines 04/08/2024 POSITIVE (A)  NONE DETECTED Final   Comment: (NOTE) Trazodone  is metabolized in vivo to several metabolites, including pharmacologically active m-CPP, which is excreted in the urine. Immunoassay screens for amphetamines and MDMA have potential cross-reactivity with these compounds and may provide false positive  results.     Tetrahydrocannabinol 04/08/2024 NONE DETECTED  NONE DETECTED Final   Barbiturates 04/08/2024 NONE DETECTED  NONE DETECTED Final   Comment: (NOTE) DRUG SCREEN FOR MEDICAL PURPOSES ONLY.  IF CONFIRMATION IS NEEDED FOR ANY PURPOSE, NOTIFY LAB WITHIN 5 DAYS.  LOWEST DETECTABLE LIMITS FOR URINE DRUG SCREEN Drug Class                     Cutoff (ng/mL) Amphetamine and metabolites    1000 Barbiturate and metabolites    200 Benzodiazepine                 200 Opiates and metabolites        300 Cocaine  and metabolites        300 THC                            50 Performed at [facility], [address] [place], [place] [address]    Acetaminophen  (Tylenol ), Serum 04/08/2024 <10 (L)  10 - 30 ug/mL Final   Comment: (NOTE) Therapeutic  concentrations vary significantly. A range of 10-30 ug/mL  may be an effective concentration for many patients. However, some  are best treated at concentrations outside of this range. Acetaminophen  concentrations >150 ug/mL at 4 hours after ingestion  and >50 ug/mL at 12 hours after ingestion are often associated with  toxic reactions.  Performed at [facility], [address], [place], [place] [address]    Salicylate Lvl 04/08/2024 <7.0 (L)  7.0 - 30.0 mg/dL Final   Performed at [facility], [address], [place], [place] [address]  Admission on 04/03/2024, Discharged on 04/03/2024  Component Date Value Ref Range Status   Sodium 04/03/2024 134 (L)  135 - 145 mmol/L Final   Potassium 04/03/2024 3.7  3.5 - 5.1 mmol/L Final   Chloride 04/03/2024 98  98 - 111 mmol/L Final   CO2 04/03/2024 24  22 - 32 mmol/L Final   Glucose, Bld 04/03/2024 94  70 - 99 mg/dL Final   Glucose reference range applies only to samples taken after fasting for at least 8 hours.   BUN 04/03/2024 6  6 - 20 mg/dL Final   Creatinine, Ser 04/03/2024 0.53 (L)  0.61 - 1.24 mg/dL Final   Calcium 92/97/7974 8.9  8.9 - 10.3 mg/dL Final   Total Protein 92/97/7974 8.7 (H)  6.5 - 8.1 g/dL Final   Albumin  04/03/2024 3.8  3.5 - 5.0 g/dL Final   AST 92/97/7974 52 (H)  15 - 41 U/L Final   ALT 04/03/2024 25  0 - 44 U/L Final   Alkaline Phosphatase 04/03/2024 114  38 - 126 U/L Final   Total Bilirubin 04/03/2024 0.6  0.0 - 1.2 mg/dL Final   GFR, Estimated 04/03/2024 >60  >60 mL/min Final   Comment: (NOTE) Calculated using the CKD-EPI Creatinine Equation (2021)  Anion gap 04/03/2024 12  5 - 15 Final   Performed at [facility], [address] [place], [place] [address]   WBC 04/03/2024 7.8  4.0 - 10.5 K/uL Final   RBC 04/03/2024 3.97 (L)  4.22 - 5.81 MIL/uL Final   Hemoglobin 04/03/2024 11.1 (L)  13.0 - 17.0 g/dL Final   HCT 92/97/7974 34.8 (L)  39.0 - 52.0 % Final   MCV 04/03/2024 87.7   80.0 - 100.0 fL Final   MCH 04/03/2024 28.0  26.0 - 34.0 pg Final   MCHC 04/03/2024 31.9  30.0 - 36.0 g/dL Final   RDW 92/97/7974 13.4  11.5 - 15.5 % Final   Platelets 04/03/2024 356  150 - 400 K/uL Final   nRBC 04/03/2024 0.0  0.0 - 0.2 % Final   Neutrophils Relative % 04/03/2024 54  % Final   Neutro Abs 04/03/2024 4.1  1.7 - 7.7 K/uL Final   Lymphocytes Relative 04/03/2024 34  % Final   Lymphs Abs 04/03/2024 2.6  0.7 - 4.0 K/uL Final   Monocytes Relative 04/03/2024 11  % Final   Monocytes Absolute 04/03/2024 0.9  0.1 - 1.0 K/uL Final   Eosinophils Relative 04/03/2024 0  % Final   Eosinophils Absolute 04/03/2024 0.0  0.0 - 0.5 K/uL Final   Basophils Relative 04/03/2024 1  % Final   Basophils Absolute 04/03/2024 0.1  0.0 - 0.1 K/uL Final   Immature Granulocytes 04/03/2024 0  % Final   Abs Immature Granulocytes 04/03/2024 0.02  0.00 - 0.07 K/uL Final   Performed at [facility], [address] [place], [place] [address]   Sed Rate 04/03/2024 30 (H)  0 - 16 mm/hr Final   Performed at [facility], [address], [place], [place] [address]   CRP 04/03/2024 1.3 (H)  <1.0 mg/dL Final   Performed at [facility], [address], [place], [place] [address]  Admission on 03/30/2024, Discharged on 04/01/2024  Component Date Value Ref Range Status   Glucose-Capillary 03/30/2024 107 (H)  70 - 99 mg/dL Final   Glucose reference range applies only to samples taken after fasting for at least 8 hours.   Sodium 03/30/2024 142  135 - 145 mmol/L Final   Potassium 03/30/2024 3.3 (L)  3.5 - 5.1 mmol/L Final   Chloride 03/30/2024 106  98 - 111 mmol/L Final   CO2 03/30/2024 23  22 - 32 mmol/L Final   Glucose, Bld 03/30/2024 107 (H)  70 - 99 mg/dL Final   Glucose reference range applies only to samples taken after fasting for at least 8 hours.   BUN 03/30/2024 5 (L)  6 - 20 mg/dL Final   Creatinine, Ser 03/30/2024 0.58 (L)  0.61 - 1.24 mg/dL Final   Calcium  93/71/7974 8.1 (L)  8.9 - 10.3 mg/dL Final   Total Protein 93/71/7974 7.4  6.5 - 8.1 g/dL Final   Albumin  03/30/2024 3.5  3.5 - 5.0 g/dL Final   AST 93/71/7974 53 (H)  15 - 41 U/L Final   ALT 03/30/2024 25  0 - 44 U/L Final   Alkaline Phosphatase 03/30/2024 103  38 - 126 U/L Final   Total Bilirubin 03/30/2024 0.4  0.0 - 1.2 mg/dL Final   GFR, Estimated 03/30/2024 >60  >60 mL/min Final   Comment: (NOTE) Calculated using the CKD-EPI Creatinine Equation (2021)    Anion gap 03/30/2024 13  5 - 15 Final   Performed at [facility], [address], [place], [place] [address]   Salicylate Lvl 03/30/2024 <7.0 (L)  7.0 - 30.0 mg/dL Final   Performed at [facility], [address], [place], [place] [address]   Acetaminophen  (Tylenol ), Serum 03/30/2024 <10 (L)  10 - 30 ug/mL Final   Comment: (NOTE) Therapeutic concentrations vary significantly. A range of 10-30 ug/mL  may be an effective concentration for many patients. However, some  are best treated at concentrations outside of this range. Acetaminophen  concentrations >150 ug/mL at 4 hours after ingestion  and >50 ug/mL at 12 hours after ingestion are often associated with  toxic reactions.  Performed at [facility], [address] [place], [place] [address]    Alcohol, Ethyl (B) 03/30/2024 243 (H)  <15 mg/dL Final   Comment: (NOTE) For medical purposes only. Performed at [facility], [address], [place], [place] [address]    Opiates 03/30/2024 NONE DETECTED  NONE DETECTED Final   Cocaine  03/30/2024 POSITIVE (A)  NONE DETECTED Final   Benzodiazepines 03/30/2024 POSITIVE (A)  NONE DETECTED Final   Amphetamines 03/30/2024 NONE DETECTED  NONE DETECTED Final   Tetrahydrocannabinol 03/30/2024 NONE DETECTED  NONE DETECTED Final   Barbiturates 03/30/2024 NONE DETECTED  NONE DETECTED Final   Comment: (NOTE) DRUG SCREEN FOR MEDICAL PURPOSES ONLY.  IF CONFIRMATION IS NEEDED FOR ANY PURPOSE, NOTIFY LAB WITHIN 5  DAYS.  LOWEST DETECTABLE LIMITS FOR URINE DRUG SCREEN Drug Class                     Cutoff (ng/mL) Amphetamine and metabolites    1000 Barbiturate and metabolites    200 Benzodiazepine                 200 Opiates and metabolites        300 Cocaine  and metabolites        300 THC                            50 Performed at [facility], [address] [place], [place] [address]    WBC 03/30/2024 6.0  4.0 - 10.5 K/uL Final   RBC 03/30/2024 3.66 (L)  4.22 - 5.81 MIL/uL Final   Hemoglobin 03/30/2024 10.6 (L)  13.0 - 17.0 g/dL Final   HCT 93/71/7974 32.1 (L)  39.0 - 52.0 % Final   MCV 03/30/2024 87.7  80.0 - 100.0 fL Final   MCH 03/30/2024 29.0  26.0 - 34.0 pg Final   MCHC 03/30/2024 33.0  30.0 - 36.0 g/dL Final   RDW 93/71/7974 13.2  11.5 - 15.5 % Final   Platelets 03/30/2024 307  150 - 400 K/uL Final   nRBC 03/30/2024 0.0  0.0 - 0.2 % Final   Neutrophils Relative % 03/30/2024 57  % Final   Neutro Abs 03/30/2024 3.5  1.7 - 7.7 K/uL Final   Lymphocytes Relative 03/30/2024 33  % Final   Lymphs Abs 03/30/2024 2.0  0.7 - 4.0 K/uL Final   Monocytes Relative 03/30/2024 7  % Final   Monocytes Absolute 03/30/2024 0.4  0.1 - 1.0 K/uL Final   Eosinophils Relative 03/30/2024 1  % Final   Eosinophils Absolute 03/30/2024 0.1  0.0 - 0.5 K/uL Final   Basophils Relative 03/30/2024 1  % Final   Basophils Absolute 03/30/2024 0.1  0.0 - 0.1 K/uL Final   Immature Granulocytes 03/30/2024 1  % Final  Abs Immature Granulocytes 03/30/2024 0.03  0.00 - 0.07 K/uL Final   Performed at [facility], [address] [place], [place] [address]  Admission on 03/29/2024, Discharged on 03/29/2024  Component Date Value Ref Range Status   Sodium 03/29/2024 140  135 - 145 mmol/L Final   Potassium 03/29/2024 3.2 (L)  3.5 - 5.1 mmol/L Final   Chloride 03/29/2024 103  98 - 111 mmol/L Final   CO2 03/29/2024 27  22 - 32 mmol/L Final   Glucose, Bld 03/29/2024 101 (H)  70 - 99 mg/dL Final   Glucose  reference range applies only to samples taken after fasting for at least 8 hours.   BUN 03/29/2024 8  6 - 20 mg/dL Final   Creatinine, Ser 03/29/2024 0.55 (L)  0.61 - 1.24 mg/dL Final   Calcium 93/72/7974 8.4 (L)  8.9 - 10.3 mg/dL Final   Total Protein 93/72/7974 7.0  6.5 - 8.1 g/dL Final   Albumin  03/29/2024 3.3 (L)  3.5 - 5.0 g/dL Final   AST 93/72/7974 43 (H)  15 - 41 U/L Final   ALT 03/29/2024 20  0 - 44 U/L Final   Alkaline Phosphatase 03/29/2024 107  38 - 126 U/L Final   Total Bilirubin 03/29/2024 0.5  0.0 - 1.2 mg/dL Final   GFR, Estimated 03/29/2024 >60  >60 mL/min Final   Comment: (NOTE) Calculated using the CKD-EPI Creatinine Equation (2021)    Anion gap 03/29/2024 10  5 - 15 Final   Performed at [facility], [address] [place], [place] [address]   Opiates 03/29/2024 NONE DETECTED  NONE DETECTED Final   Cocaine  03/29/2024 POSITIVE (A)  NONE DETECTED Final   Benzodiazepines 03/29/2024 POSITIVE (A)  NONE DETECTED Final   Amphetamines 03/29/2024 NONE DETECTED  NONE DETECTED Final   Tetrahydrocannabinol 03/29/2024 NONE DETECTED  NONE DETECTED Final   Barbiturates 03/29/2024 NONE DETECTED  NONE DETECTED Final   Comment: (NOTE) DRUG SCREEN FOR MEDICAL PURPOSES ONLY.  IF CONFIRMATION IS NEEDED FOR ANY PURPOSE, NOTIFY LAB WITHIN 5 DAYS.  LOWEST DETECTABLE LIMITS FOR URINE DRUG SCREEN Drug Class                     Cutoff (ng/mL) Amphetamine and metabolites    1000 Barbiturate and metabolites    200 Benzodiazepine                 200 Opiates and metabolites        300 Cocaine  and metabolites        300 THC                            50 Performed at [facility], [address] [place], [place] [address]    WBC 03/29/2024 3.4 (L)  4.0 - 10.5 K/uL Final   RBC 03/29/2024 3.37 (L)  4.22 - 5.81 MIL/uL Final   Hemoglobin 03/29/2024 9.7 (L)  13.0 - 17.0 g/dL Final   HCT 93/72/7974 30.5 (L)  39.0 - 52.0 % Final   MCV 03/29/2024 90.5   80.0 - 100.0 fL Final   MCH 03/29/2024 28.8  26.0 - 34.0 pg Final   MCHC 03/29/2024 31.8  30.0 - 36.0 g/dL Final   RDW 93/72/7974 13.1  11.5 - 15.5 % Final   Platelets 03/29/2024 204  150 - 400 K/uL Final   nRBC 03/29/2024 0.0  0.0 - 0.2 % Final  Neutrophils Relative % 03/29/2024 43  % Final   Neutro Abs 03/29/2024 1.5 (L)  1.7 - 7.7 K/uL Final   Lymphocytes Relative 03/29/2024 43  % Final   Lymphs Abs 03/29/2024 1.5  0.7 - 4.0 K/uL Final   Monocytes Relative 03/29/2024 12  % Final   Monocytes Absolute 03/29/2024 0.4  0.1 - 1.0 K/uL Final   Eosinophils Relative 03/29/2024 1  % Final   Eosinophils Absolute 03/29/2024 0.0  0.0 - 0.5 K/uL Final   Basophils Relative 03/29/2024 1  % Final   Basophils Absolute 03/29/2024 0.0  0.0 - 0.1 K/uL Final   Immature Granulocytes 03/29/2024 0  % Final   Abs Immature Granulocytes 03/29/2024 0.01  0.00 - 0.07 K/uL Final   Performed at [facility], [address] [place], [place] [address]   Alcohol, Ethyl (B) 03/29/2024 <15  <15 mg/dL Final   Comment: (NOTE) For medical purposes only. Performed at [facility], [address], [place], [place] [address]   Admission on 03/27/2024, Discharged on 03/28/2024  Component Date Value Ref Range Status   Glucose-Capillary 03/28/2024 131 (H)  70 - 99 mg/dL Final   Glucose reference range applies only to samples taken after fasting for at least 8 hours.   Sodium 03/27/2024 137  135 - 145 mmol/L Final   Potassium 03/27/2024 3.2 (L)  3.5 - 5.1 mmol/L Final   Chloride 03/27/2024 101  98 - 111 mmol/L Final   CO2 03/27/2024 24  22 - 32 mmol/L Final   Glucose, Bld 03/27/2024 114 (H)  70 - 99 mg/dL Final   Glucose reference range applies only to samples taken after fasting for at least 8 hours.   BUN 03/27/2024 <5 (L)  6 - 20 mg/dL Final   Creatinine, Ser 03/27/2024 0.62  0.61 - 1.24 mg/dL Final   Calcium 93/74/7974 8.5 (L)  8.9 - 10.3 mg/dL Final   Total Protein 93/74/7974  8.6 (H)  6.5 - 8.1 g/dL Final   Albumin  03/27/2024 4.1  3.5 - 5.0 g/dL Final   AST 93/74/7974 43 (H)  15 - 41 U/L Final   ALT 03/27/2024 22  0 - 44 U/L Final   Alkaline Phosphatase 03/27/2024 122  38 - 126 U/L Final   Total Bilirubin 03/27/2024 0.7  0.0 - 1.2 mg/dL Final   GFR, Estimated 03/27/2024 >60  >60 mL/min Final   Comment: (NOTE) Calculated using the CKD-EPI Creatinine Equation (2021)    Anion gap 03/27/2024 12  5 - 15 Final   Performed at [facility], [address], [place], [place] [address]   Salicylate Lvl 03/27/2024 <7.0 (L)  7.0 - 30.0 mg/dL Final   Performed at [facility], [address] [place], [place] [address]   Acetaminophen  (Tylenol ), Serum 03/27/2024 <10 (L)  10 - 30 ug/mL Final   Comment: (NOTE) Therapeutic concentrations vary significantly. A range of 10-30 ug/mL  may be an effective concentration for many patients. However, some  are best treated at concentrations outside of this range. Acetaminophen  concentrations >150 ug/mL at 4 hours after ingestion  and >50 ug/mL at 12 hours after ingestion are often associated with  toxic reactions.  Performed at [facility], [address] [place], [place] [address]    Alcohol, Ethyl (B) 03/27/2024 146 (H)  <15 mg/dL Final   Comment: (NOTE) For medical purposes only. Performed at [facility], [address], [place], [place] [address]  WBC 03/27/2024 8.0  4.0 - 10.5 K/uL Final   RBC 03/27/2024 3.83 (L)  4.22 - 5.81 MIL/uL Final   Hemoglobin 03/27/2024 10.9 (L)  13.0 - 17.0 g/dL Final   HCT 93/74/7974 34.2 (L)  39.0 - 52.0 % Final   MCV 03/27/2024 89.3  80.0 - 100.0 fL Final   MCH 03/27/2024 28.5  26.0 - 34.0 pg Final   MCHC 03/27/2024 31.9  30.0 - 36.0 g/dL Final   RDW 93/74/7974 13.2  11.5 - 15.5 % Final   Platelets 03/27/2024 318  150 - 400 K/uL Final   nRBC 03/27/2024 0.0  0.0 - 0.2 % Final   Neutrophils Relative %  03/27/2024 73  % Final   Neutro Abs 03/27/2024 5.8  1.7 - 7.7 K/uL Final   Lymphocytes Relative 03/27/2024 20  % Final   Lymphs Abs 03/27/2024 1.6  0.7 - 4.0 K/uL Final   Monocytes Relative 03/27/2024 6  % Final   Monocytes Absolute 03/27/2024 0.5  0.1 - 1.0 K/uL Final   Eosinophils Relative 03/27/2024 0  % Final   Eosinophils Absolute 03/27/2024 0.0  0.0 - 0.5 K/uL Final   Basophils Relative 03/27/2024 1  % Final   Basophils Absolute 03/27/2024 0.1  0.0 - 0.1 K/uL Final   Immature Granulocytes 03/27/2024 0  % Final   Abs Immature Granulocytes 03/27/2024 0.03  0.00 - 0.07 K/uL Final   Performed at [facility], [address] [place], [place] [address]  There may be more visits with results that are not included.    Blood Alcohol level:  Lab Results  Component Value Date   ETH 126 (H) 05/10/2024   ETH <15 04/27/2024    Metabolic Disorder Labs: Lab Results  Component Value Date   HGBA1C 6.0 (H) 12/08/2023   MPG 125.5 12/08/2023   MPG 122.63 11/25/2023   Lab Results  Component Value Date   PROLACTIN 31.4 (H) 11/16/2019   Lab Results  Component Value Date   CHOL 125 05/10/2024   TRIG 49 05/10/2024   HDL 48 05/10/2024   CHOLHDL 2.6 05/10/2024   VLDL 10 05/10/2024   LDLCALC 67 05/10/2024   LDLCALC 51 12/08/2023    Therapeutic Lab Levels: No results found for: LITHIUM No results found for: VALPROATE No results found for: CBMZ  Physical Findings   AIMS    Flowsheet Row [facility]) [facility]) [facility] Ad[facility] (Discharged) [facility]  AIMS Total Score 0 0 0   AUDIT    Flowsheet Row ED from 05/10/2024 in [facility] Admission (Discharged) [facility] (Discharged)  [facility] Ad[facility] (Discharged) from OP Visit [facility] Ad[facility] (Discharged) [facility]  Alcohol Use Disorder Identification Test Final Score (AUDIT) 31 26 26 13  37   PHQ2-9    Flowsheet Row ED from 05/10/2024 in [facility] ED from 01/14/2024 in [facility] ED from 12/08/2023 in [facility] from 11/21/2023 in [facility] ED from 06/03/2023 in [facility]  PHQ-2 Total Score 4 0 0 2 0  PHQ-9 Total Score 12 14 0 8 --  Flowsheet Row ED from 05/10/2024 in [facility] Most recent reading at 05/10/2024 10:06 AM ED from 05/10/2024 in [facility] Most recent reading at 05/10/2024  7:38 AM ED from 05/09/2024 in [facility] Emergency Department at [facility] Most recent reading at 05/09/2024  7:24 AM  C-SSRS RISK CATEGORY Moderate Risk Moderate Risk Error: Q3, 4, or 5 should not be populated when Q2 is No     Musculoskeletal  Strength & Muscle Tone: within normal limits Gait & Station: walker Patient leans: Left  Psychiatric Specialty Exam  Presentation  General Appearance:  Casual; Appropriate for Environment (lying in bed with eyes closed mostly shirtless and a blanket)  Eye Contact: Minimal  Speech: Other (comment) (minimal responses to questions)  Speech Volume: Decreased  Handedness: Right   Mood and Affect  Mood: Irritable  Affect: Congruent   Thought Process  Thought Processes: Coherent  Descriptions of Associations:Intact  Orientation:Full (Time, Place and Person)  Thought Content:Logical  Diagnosis of Schizophrenia or Schizoaffective disorder in past: Yes  Duration of Psychotic Symptoms: Greater than six  months   Hallucinations:Hallucinations: Auditory Description of Auditory Hallucinations: constant  Ideas of Reference:None  Suicidal Thoughts:Suicidal Thoughts: No SI Passive Intent and/or Plan: Without Intent  Homicidal Thoughts:Homicidal Thoughts: No   Sensorium  Memory: Recent Good; Immediate Good; Remote Good  Judgment: Fair  Insight: Poor; Lacking   Executive Functions  Concentration: Good  Attention Span: Good  Recall: Good  Fund of Knowledge: Good  Language: Good   Psychomotor Activity  Psychomotor Activity: Psychomotor Activity: Normal   Assets  Assets: Communication Skills   Sleep  Sleep: Sleep: Good  Estimated Sleeping Duration (Last 24 Hours): 8.50-10.75 hours  No data recorded  Physical Exam  Physical Exam Constitutional:      General: He is not in acute distress.    Appearance: He is normal weight. He is not toxic-appearing.  HENT:     Head: Normocephalic and atraumatic.     Right Ear: External ear normal.     Left Ear: External ear normal.     Nose: Nose normal.     Mouth/Throat:     Mouth: Mucous membranes are moist.  Eyes:     Pupils: Pupils are equal, round, and reactive to light.  Pulmonary:     Effort: Pulmonary effort is normal.  Musculoskeletal:        General: Normal range of motion.     Cervical back: Normal range of motion.  Skin:    General: Skin is warm and dry.  Neurological:     General: No focal deficit present.     Mental Status: He is alert.     Review of Systems  Constitutional:  Positive for malaise/fatigue and weight loss. Negative for chills and fever.  HENT:  Negative for hearing loss.   Cardiovascular:  Negative for chest pain.  Gastrointestinal: Negative for diarrhea. Negative for abdominal pain.  Musculoskeletal:  Positive for myalgias.  Neurological:  Negative for dizziness.  Psychiatric/Behavioral:  Positive for depression. Negative for hallucinations and suicidal ideas. The patient  is not nervous/anxious and does not have insomnia.   Blood pressure 94/60, pulse 61, temperature 97.7 F (36.5 C), resp. rate 18, SpO2 99%. There is no height or weight on file to calculate BMI.  Treatment Plan Summary: Daily contact with patient to assess and evaluate symptoms and progress in treatment and Medication management 33 year old male with a history of substance use including heroine  and alcohol. He was discharged 3 days ago from inpatient psych and was taking Abilify , Sertraline  and Seroquel . He has a history of diagnoses of Bipolar disorder and Schizophrenia. He says that he hears voices daily. They are made worse when he is using substances and when he does not take psychiatric medication.     Alcohol and heroine dep Librium  and Clonidine  withdrawal protocols Uncomplicated course of withdrawal.   Schizoaffective disorder Restart medication that he was recently discharged with. Abilify , Seroquel  and Sertraline  Titrate doses based on response Lipid and Hemoglobin A1C    EKG result Qtc 468   Dispo to residential rehabilitation. Patient wishes to leave today against medical advice.   Garvin JINNY Gaines, MD 05/12/2024 12:15 PM

## 2024-05-13 LAB — HEMOGLOBIN A1C
Hgb A1c MFr Bld: 5.6 % (ref 4.8–5.6)
Mean Plasma Glucose: 114 mg/dL

## 2024-05-13 LAB — PROLACTIN: Prolactin: 20.5 ng/mL (ref 3.9–22.7)

## 2024-05-14 ENCOUNTER — Emergency Department (HOSPITAL_COMMUNITY)
Admission: EM | Admit: 2024-05-14 | Discharge: 2024-05-14 | Disposition: A | Discharge status: Home / Self Care | Payer: MEDICAID | Source: Home / Self Care | Attending: Student | Admitting: Student

## 2024-05-14 ENCOUNTER — Other Ambulatory Visit: Payer: Self-pay

## 2024-05-14 ENCOUNTER — Encounter (HOSPITAL_COMMUNITY): Payer: Self-pay

## 2024-05-14 ENCOUNTER — Emergency Department (HOSPITAL_COMMUNITY)
Admission: EM | Admit: 2024-05-14 | Discharge: 2024-05-14 | Disposition: A | Discharge status: Home / Self Care | Payer: MEDICAID | Attending: Emergency Medicine | Admitting: Emergency Medicine

## 2024-05-14 ENCOUNTER — Emergency Department (HOSPITAL_COMMUNITY): Payer: MEDICAID

## 2024-05-14 DIAGNOSIS — I10 Essential (primary) hypertension: Secondary | ICD-10-CM | POA: Insufficient documentation

## 2024-05-14 DIAGNOSIS — Z96642 Presence of left artificial hip joint: Secondary | ICD-10-CM | POA: Insufficient documentation

## 2024-05-14 DIAGNOSIS — H05232 Hemorrhage of left orbit: Secondary | ICD-10-CM

## 2024-05-14 DIAGNOSIS — F17210 Nicotine dependence, cigarettes, uncomplicated: Secondary | ICD-10-CM | POA: Insufficient documentation

## 2024-05-14 DIAGNOSIS — S0993XA Unspecified injury of face, initial encounter: Secondary | ICD-10-CM | POA: Insufficient documentation

## 2024-05-14 DIAGNOSIS — Y92524 Gas station as the place of occurrence of the external cause: Secondary | ICD-10-CM | POA: Insufficient documentation

## 2024-05-14 DIAGNOSIS — S0012XA Contusion of left eyelid and periocular area, initial encounter: Secondary | ICD-10-CM | POA: Insufficient documentation

## 2024-05-14 DIAGNOSIS — Z5900 Homelessness unspecified: Secondary | ICD-10-CM | POA: Insufficient documentation

## 2024-05-14 MED ORDER — ONDANSETRON 4 MG PO TBDP
4.0000 mg | ORAL_TABLET | Freq: Once | ORAL | Status: AC
  Administered 2024-05-14 (×2): 4 mg via ORAL
  Filled 2024-05-14: qty 1

## 2024-05-14 MED ORDER — HYDROCODONE-ACETAMINOPHEN 5-325 MG PO TABS
1.0000 | ORAL_TABLET | Freq: Once | ORAL | Status: AC
  Administered 2024-05-14 (×2): 1 via ORAL
  Filled 2024-05-14: qty 1

## 2024-05-14 MED ORDER — DIPHENHYDRAMINE HCL 50 MG/ML IJ SOLN
25.0000 mg | Freq: Once | INTRAMUSCULAR | Status: AC
  Administered 2024-05-14 (×2): 25 mg via INTRAVENOUS
  Filled 2024-05-14: qty 1

## 2024-05-14 MED ORDER — SODIUM CHLORIDE 0.9 % IV BOLUS
1000.0000 mL | Freq: Once | INTRAVENOUS | Status: AC
  Administered 2024-05-14 (×2): 1000 mL via INTRAVENOUS

## 2024-05-14 MED ORDER — KETOROLAC TROMETHAMINE 15 MG/ML IJ SOLN
15.0000 mg | Freq: Once | INTRAMUSCULAR | Status: AC
  Administered 2024-05-14 (×2): 15 mg via INTRAVENOUS
  Filled 2024-05-14: qty 1

## 2024-05-14 MED ORDER — PROCHLORPERAZINE EDISYLATE 10 MG/2ML IJ SOLN
10.0000 mg | Freq: Once | INTRAMUSCULAR | Status: AC
  Administered 2024-05-14 (×2): 10 mg via INTRAVENOUS
  Filled 2024-05-14: qty 2

## 2024-05-14 MED ORDER — NAPROXEN 375 MG PO TABS: 375.0000 mg | ORAL_TABLET | Freq: Two times a day (BID) | ORAL | 0 refills | Status: DC

## 2024-05-14 NOTE — ED Notes (Signed)
 Pt found sitting on the edge of the bed. Educated not to get up and was able to crawl back in bed. Given warm blankets.

## 2024-05-14 NOTE — ED Notes (Signed)
 Pt refused to let this RN update vital signs.
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
## 2024-05-14 NOTE — ED Notes (Signed)
 Pt refusing to keep any monitor cords on.

## 2024-05-14 NOTE — ED Notes (Signed)
 Pt taken to CT.

## 2024-05-14 NOTE — Discharge Instructions (Signed)
 You were evaluated in the Emergency Department and after careful evaluation, we did not find any emergent condition requiring admission or further testing in the hospital.  Your exam/testing today is overall reassuring.  Scans did not show any significant injuries.  Recommend Tylenol  and Motrin  for any lingering discomfort.  Please return to the Emergency Department if you experience any worsening of your condition.   Thank you for allowing us  to be a part of your care.

## 2024-05-14 NOTE — ED Triage Notes (Signed)
Denies LOC

## 2024-05-14 NOTE — ED Notes (Signed)
 Pt declined DC vitals
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
## 2024-05-14 NOTE — ED Notes (Signed)
 Pt found to be urinating all over the floor despite the urinal being within reach. Floor cleaned and pt educated on use of the urinal.
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
## 2024-05-22 ENCOUNTER — Ambulatory Visit (HOSPITAL_COMMUNITY): Payer: Self-pay

## 2024-05-23 ENCOUNTER — Encounter (HOSPITAL_COMMUNITY): Payer: Self-pay

## 2024-05-23 ENCOUNTER — Emergency Department (HOSPITAL_COMMUNITY)
Admission: EM | Admit: 2024-05-23 | Discharge: 2024-05-23 | Disposition: A | Discharge status: Home / Self Care | Payer: MEDICAID | Attending: Emergency Medicine | Admitting: Emergency Medicine

## 2024-05-23 ENCOUNTER — Emergency Department (HOSPITAL_COMMUNITY): Payer: MEDICAID

## 2024-05-23 DIAGNOSIS — W19XXXA Unspecified fall, initial encounter: Secondary | ICD-10-CM | POA: Insufficient documentation

## 2024-05-23 DIAGNOSIS — F419 Anxiety disorder, unspecified: Secondary | ICD-10-CM | POA: Insufficient documentation

## 2024-05-23 DIAGNOSIS — S0592XA Unspecified injury of left eye and orbit, initial encounter: Secondary | ICD-10-CM | POA: Diagnosis present

## 2024-05-23 DIAGNOSIS — S0012XA Contusion of left eyelid and periocular area, initial encounter: Secondary | ICD-10-CM | POA: Diagnosis not present

## 2024-05-23 DIAGNOSIS — F1920 Other psychoactive substance dependence, uncomplicated: Secondary | ICD-10-CM | POA: Diagnosis not present

## 2024-05-23 DIAGNOSIS — F411 Generalized anxiety disorder: Secondary | ICD-10-CM

## 2024-05-23 DIAGNOSIS — F1990 Other psychoactive substance use, unspecified, uncomplicated: Secondary | ICD-10-CM | POA: Insufficient documentation

## 2024-05-23 DIAGNOSIS — M25552 Pain in left hip: Secondary | ICD-10-CM | POA: Insufficient documentation

## 2024-05-23 DIAGNOSIS — R45850 Homicidal ideations: Secondary | ICD-10-CM | POA: Insufficient documentation

## 2024-05-23 DIAGNOSIS — R45851 Suicidal ideations: Secondary | ICD-10-CM | POA: Diagnosis not present

## 2024-05-23 DIAGNOSIS — F329 Major depressive disorder, single episode, unspecified: Secondary | ICD-10-CM | POA: Insufficient documentation

## 2024-05-23 LAB — COMPREHENSIVE METABOLIC PANEL WITH GFR
ALT: 38 U/L (ref 0–44)
AST: 63 U/L — ABNORMAL HIGH (ref 15–41)
Albumin: 3.4 g/dL — ABNORMAL LOW (ref 3.5–5.0)
Alkaline Phosphatase: 104 U/L (ref 38–126)
Anion gap: 9 (ref 5–15)
BUN: 12 mg/dL (ref 6–20)
CO2: 27 mmol/L (ref 22–32)
Calcium: 8.5 mg/dL — ABNORMAL LOW (ref 8.9–10.3)
Chloride: 101 mmol/L (ref 98–111)
Creatinine, Ser: 0.57 mg/dL — ABNORMAL LOW (ref 0.61–1.24)
GFR, Estimated: >60 mL/min (ref >60)
Glucose, Bld: 105 mg/dL — ABNORMAL HIGH (ref 70–99)
Potassium: 3.3 mmol/L — ABNORMAL LOW (ref 3.5–5.1)
Sodium: 137 mmol/L (ref 135–145)
Total Bilirubin: 0.4 mg/dL (ref 0.0–1.2)
Total Protein: 7.6 g/dL (ref 6.5–8.1)

## 2024-05-23 LAB — CBC
HCT: 34.5 % — ABNORMAL LOW (ref 39.0–52.0)
Hemoglobin: 10.6 g/dL — ABNORMAL LOW (ref 13.0–17.0)
MCH: 25.6 pg — ABNORMAL LOW (ref 26.0–34.0)
MCHC: 30.7 g/dL (ref 30.0–36.0)
MCV: 83.3 fL (ref 80.0–100.0)
Platelets: 254 K/uL (ref 150–400)
RBC: 4.14 MIL/uL — ABNORMAL LOW (ref 4.22–5.81)
RDW: 14.7 % (ref 11.5–15.5)
WBC: 6 K/uL (ref 4.0–10.5)
nRBC: 0 % (ref 0.0–0.2)

## 2024-05-23 LAB — ETHANOL: Alcohol, Ethyl (B): <15 mg/dL (ref <15)

## 2024-05-23 NOTE — Consult Note (Addendum)
 [facility] Psychiatric Consult Initial  Patient Name: .Mark Hammond  MRN: [id number]  DOB: [birth year]  Consult Order details:  Orders (From admission, onward)     Start     Ordered   05/23/24 0631  CONSULT TO CALL ACT TEAM       Ordering Provider: Vicky Charleston, PA-C  Provider:  (Not yet assigned)  Question:  Reason for Consult?  Answer:  Psych consult   05/23/24 0630   05/23/24 0631  IP CONSULT TO PSYCHIATRY       Ordering Provider: Vicky Charleston, PA-C  Provider:  (Not yet assigned)  Question:  Reason for consult:  Answer:  Medication management   05/23/24 0630             Mode of Visit: Tele-visit Location of Provider [facility]     Psychiatry Consult Evaluation  Service Date: May 23, 2024 LOS:  LOS: 0 days  Chief Complaint  I need detox   Primary Psychiatric Diagnoses  Polysubstance dependence  2.  Major Depression 3.  Anxiety State   Assessment  Mark Hammond is a 33 y.o. male admitted: Presented to the EDfor 05/23/2024  3:24 AM for pain and poly-substance abuse. He carries the psychiatric diagnoses of Bipolar disorder, polysubstance abuse and major depression and has a past medical history of  Tobacco use disorder , osteomyelitis and hypokalemia.   His current presentation of depression and irritability is most consistent with polysubstance abuse. He meets criteria for outpatient  based on multiple inpatient admission.  Current outpatient psychotropic medications include Abilify ,Seroquel , Zyprexa  and Clonidine  and  and historically he has had a non- compliant with medication response to these medications. He was non compliant with medications prior to admission as evidenced by self reported homeless. On initial examination, patient is irritable stating  I can't walk, so I am not going no where . Please see plan below for detailed recommendations. Case was staffed with MD Goil, will recommenced discharged with outpatient resources.   Diagnoses:  Active Hospital  problems: Principal Problem:   Suicidal ideation    Plan   ## Psychiatric Medication Recommendations:  Home medication   ## Medical Decision Making Capacity: Not specifically addressed in this encounter  ## Further Work-up:  --  U/A -- most recent EKG  -- Pertinent labwork reviewed earlier this admission includes:    ## Disposition:-- Plan Post Discharge/Psychiatric Care Follow-up resources [facility]   ## Behavioral / Environmental: - No specific recommendations at this time.     ## Safety and Observation Level:  - Based on my clinical evaluation, I estimate the patient to be at moderate risk of self harm in the current setting. - At this time, we recommend  routine. This decision is based on my review of the chart including patient's history and current presentation, interview of the patient, mental status examination, and consideration of suicide risk including evaluating suicidal ideation, plan, intent, suicidal or self-harm behaviors, risk factors, and protective factors. This judgment is based on our ability to directly address suicide risk, implement suicide prevention strategies, and develop a safety plan while the patient is in the clinical setting. Please contact our team if there is a concern that risk level has changed.  CSSR Risk Category:C-SSRS RISK CATEGORY: High Risk  Suicide Risk Assessment: Patient has following modifiable risk factors for suicide: untreated depression, medication noncompliance, and lack of access to outpatient mental health resources, which we are addressing by providing additional outpatient resources for substance abuse treatment to include [facility].  Patient has following non-modifiable or demographic risk factors for suicide: male gender Patient has the following protective factors against suicide: Frustration tolerance  Thank you for this consult request. Recommendations have been communicated to the primary team.  We will recommended  discharged at this time.   Staci LOISE Kerns, NP       History of Present Illness  Relevant Aspects of Hospital ED Course:  Admitted on 05/23/2024   Patient Report:  Mark Hammond 33 year old male presents to the local emergency department requesting detox and reports pain in left hip.  He has a documented history related to schizophrenia, bipolar disorder and depression.  Reports he has not taken medication as directed due to homelessness and transportation issues.  Patient was seen and evaluated via tele-health virtual platform.  Presents irritable and vague related to questions.  States he is unable to walk or access medications at this time.  This provider inquired about suicidal or homicidal ideations patient states  a little bit denied intent or plan.  States he is seeking detox facility.  No current UDS at this visit.  Has a documented history opiates, cocaine , benzodiazepines, amphetamines and marijuana.  Patient was recently accepted to the facility based crisis [facility]) was reported that patient requested to leave AGAINST MEDICAL ADVICE during stay 05/12/2024.  Mark Hammond was taken to detox facility for residential treatment for alcohol and cocaine  use.  Reports last using this morning.  Very irritable and minimal throughout this assessment.  Patient was also offered a [facility] at [facility] however, he declined.   Mark Hammond is resting in bed, face is toward the wall; he is alert/oriented x 3; calm/cooperative; and mood irritable with affect.  Patient is speaking in a clear tone at moderate volume, and normal pace; with good eye contact.  His thought process is coherent and relevant; There is no indication that he is currently responding to internal/external stimuli or experiencing delusional thought content.  Patient reports passive suicidal ideations, when offered outpatient treatment.  States  it is illegal for you to discharge me if I suicidal Patient denied psychosis, and paranoia.    Psych  ROS:  Depression: document history Anxiety:  documented history Mania (lifetime and current): N/A Psychosis: (lifetime and current): with substance use  Collateral information:    Review of Systems  Psychiatric/Behavioral:  Positive for substance abuse. Suicidal ideas: passive ideation.     Psychiatric and Social History  Psychiatric History:  Information collected from chart  Prev Dx/Sx: Polysubstance abuse, schizophrenia, depression, bipolar disorder and generalized anxiety disorder. Current Psych Provider: Denied that he was followed at Home Meds (current): Patient not compliant with Abilify , Seroquel  Previous Med Trials: See chart Therapy: Denied  Prior Psych Hospitalization: Last hospitalized at facility based crisis 810 Prior Self Harm: Multiple suicide attempts by overdosing on fentanyl  as documented in chart Prior Violence: N/A  Family Psych History:  Family Hx suicide:   Social History:  Developmental Hx:  Educational Hx:  Occupational Hx:  Legal Hx:  Living Situation: Homelessness Spiritual Hx:  Access to weapons/lethal means:   Substance History Alcohol: Last using alcohol Type of alcohol  Last Drink  Number of drinks per day  History of alcohol withdrawal seizures  History of DT's  Tobacco:  Illicit drugs: Cocaine  methamphetamine Prescription drug abuse:  Rehab hx:   Exam Findings  Physical Exam:  Vital Signs:  Temp:  [98.1 F (36.7 C)] 98.1 F (36.7 C) (08/21 0331) Pulse Rate:  [101] 101 (08/21 0331) Resp:  [  18] 18 (08/21 0331) BP: (125)/(69) 125/69 (08/21 0331) SpO2:  [94 %-96 %] 94 % (08/21 0331) Blood pressure 125/69, pulse (!) 101, temperature 98.1 F (36.7 C), temperature source Oral, resp. rate 18, SpO2 94%. There is no height or weight on file to calculate BMI.  Physical Exam  Mental Status Exam: General Appearance: Casual  Orientation:  Full (Time, Place, and Person)  Memory:  Immediate;   Good  Concentration:  Concentration:  Fair  Recall:  Fair  Attention  Fair  Eye Contact:  Fair  Speech:  Clear and Coherent  Language:  NA  Volume:  Normal  Mood: Irritable depressed  Affect:  Flat  Thought Process:  Coherent  Thought Content:  Logical  Suicidal Thoughts:  Yes.  without intent/plan  Homicidal Thoughts:  No  Judgement:  Good  Insight:  Fair  Psychomotor Activity:  Normal  Akathisia:  No  Fund of Knowledge:  Good      Assets:  Communication Skills Desire for Improvement  Cognition:  WNL  ADL's:  Intact  AIMS (if indicated):        Other History   These have been pulled in through the EMR, reviewed, and updated if appropriate.  Family History:  The patient's family history includes Stroke in an other family member.  Medical History: Past Medical History:  Diagnosis Date   Alcoholic gastritis    Bipolar 1 disorder (HCC)    Dental abscess    Depression    Drug abuse (HCC)    ETOH abuse    Hallucination    Hypertension    Schizo affective schizophrenia (HCC)    Substance abuse (HCC)     Surgical History: Past Surgical History:  Procedure Laterality Date   TOTAL HIP ARTHROPLASTY Left 02/23/2024   Procedure: ARTHROPLASTY, HIP, TOTAL,POSTERIOR APPROACH;  Surgeon: Jerri Kay HERO, MD;  Location: [facility] OR;  Service: Orthopedics;  Laterality: Left;  LEFT POSTERIOR HIP RESECTION ARTHROPLASTY WITH ANTIBIOTIC SPACER     Medications:  No current facility-administered medications for this encounter.  Current Outpatient Medications:    ARIPiprazole  (ABILIFY ) 5 MG tablet, Take 5 mg by mouth daily., Disp: , Rfl:    QUEtiapine  (SEROQUEL ) 50 MG tablet, Take 50 mg by mouth at bedtime., Disp: , Rfl:    sertraline  (ZOLOFT ) 50 MG tablet, Take 50 mg by mouth daily., Disp: , Rfl:    hydrOXYzine  (ATARAX ) 25 MG tablet, Take 25 mg by mouth every 6 (six) hours as needed for anxiety. (Patient not taking: Reported on 05/23/2024), Disp: , Rfl:    naproxen  (NAPROSYN ) 375 MG tablet, Take 1 tablet (375 mg total) by  mouth 2 (two) times daily. (Patient not taking: Reported on 05/23/2024), Disp: 20 tablet, Rfl: 0  Allergies: Allergies  Allergen Reactions   Codeine Other (See Comments)    Unknown childhood reaction   Fish Allergy Other (See Comments)    Patient does not wish to eat ANY fish- reaction unclear   Shellfish Allergy Other (See Comments)    Patient does not wish to eat ANY shellfish- reaction unclear    Staci LOISE Kerns, NP

## 2024-05-23 NOTE — ED Triage Notes (Signed)
 BIB surgery left hip 3-4 months, had a fall at 200 today. Injured left hip, report to EMS difficulty walking, Endorse Suicidal ideation,  Admitted polysubstance use and wants detox.  Calm and cooperative at triage

## 2024-05-23 NOTE — ED Provider Notes (Signed)
 Emergency Medicine Observation Re-evaluation Note  Mark Hammond is a 33 y.o. male, seen on rounds today.  Pt initially presented to the ED for complaints of No chief complaint on file. Currently, the patient is resting comfortably.  He had come in with some SI yesterday.  Patient has polysubstance use disorder.  Psychiatry team has seen the patient and cleared him.  They have given him outpatient resources.     Charlyn Sora, MD 05/23/24 1151

## 2024-05-23 NOTE — ED Notes (Signed)
 Pt gave pocket Knife to NT, said: take it before I do something with it. Knife was given to security.

## 2024-05-23 NOTE — Discharge Instructions (Addendum)
 We saw you in the ER for your mental health concerns and had our behavioral health team evaluate you. The team feels comfortable sending you home, please follow the recommendations given to you, read the instructions provided.  You may consider going to behavioral health urgent care if symptoms are progressing again. Please refrain from substance abuse. Return to the ER if your symptoms worsen.   Substance Abuse Treatment Programs  Intensive Outpatient Programs [facility]     [address]      [place], [place]                   [phone]       [facility] [address] [place] [address] [place], [place] [phone]  [facility]     (Inpatient and outpatient)     [address]           [phone]    [facility] [phone] (Suboxone and Methadone)  [address]      [place], [place] [address]      [phone]       [address] [place], [place] [phone]  [facility] (Outpatient/Inpatient, [employer])    (insurance only) [phone]             [facility] (Groups & Residential) [place], [place] [phone]     [facility]     [address]     [place], [place]      [phone]       [facility] (for caregivers and family) [phone] [address] [place], [place] [phone]      Residential Treatment Programs [facility]      [address] [place], [place] [address]  [phone]       [facility] [address], [place], [place] [address] [phone]  [facility] [place]        [phone]       [facility] [phone]  [facility] ([facility]             [address]                                         [place], [place]                                                [phone] or [phone]                               [facility] [address] [place],  [address] [phone]  [facility]    [address]      [place], [place]     [phone]       [facility] [address] [place], [place] [phone]  [facility]   [address] [place], [place] [address]     [phone]      Admissions: 8am-3pm M-F  [facility] ([facility]) [address] [place], [place] [phone]  [facility]: Residential Program [phone] Days)   [place], [place]      [phone]  or [phone]     [facility]: [facility] [place], [place] (Walk in Hours over the weekend or by referral)  [facility] [address] [place], [place], [place] [address] [phone]  Crisis Mobile: [facility]:  [phone] (for crisis response 24 hours a day) [facility] Hotline:      [phone] Outpatient Psychiatry and Counseling  [facility]: Mobile Crisis Management 24 hours:  [phone]  [facility] of the [employer] sliding scale fee and walk in schedule: M-F 8am-12pm/1pm-3pm [address]  [place], [place] [address] [phone]  [facility] [address] [place], [place] [address] [phone]  [facility] ([facility] [employer])- new patient walk-in appointments available Monday - Friday 8am -3pm.          [address] [place], [place] [address] [phone] or crisis line- [phone]  [facility] Outpatient Services/ Intensive Outpatient Therapy Program [address] [place], [place] [address] [phone]  [facility] Mental Health                  Crisis Services      [phone] [address]     [place], [place] [address]                 [facility]   [facility] [phone]. [address] [place], [place] [address]   [facility]          [address] [place]   [place], [place] [address]       [phone]  [facility] [address] [place], [place] [address] [phone]  [facility]    [address]    [place], [place] [address]     [phone]       Ima Anon, MD     [address]     [place] [address]     [phone]       [facility] [address] [place] [address]  [facility]     [address]     [place], [place]      [phone]       [facility] [place], MD [address] [place], [place] [address] [phone]  [facility]     [address]     [place], [place] [address]     [phone]       [facility] [address] [place], [place] [address] [phone] Resources for Temporary Residential Assistance/Crisis Centers  [facility] [place]) M-F 8am-3pm   [address]  [place], [place] [address]   [phone] Services include: laundry, barbering, support groups, case management, phone  & computer access, showers, AA/NA mtgs, mental health/substance abuse nurse, job skills class, disability information, VA assistance, spiritual classes, etc.   HOMELESS SHELTERS  [facility]     [facility]   [address], [place]     [phone]              [employer] (women and children)       [address] [place], [place] [address] [phone] [email or web address] for application and process Application Required  [facility]   [address]    [place] [address]  [phone]                    [facility] [place] [address] [place], [place] [address] [phone] application appt.) Application Required  [facility] (women only)    [address]     [place], [place]  [address]     [phone]      Intake starts 6pm daily Need valid ID, SSC, & Police report [employer] [address] [place], [place] [phone] Application Required  [employer] (men only)     [address]      [place], [place]     [phone]       [facility] (Pregnant women only) [address] [place], [place] [phone]  [facility]      [address]      [place], [place] [address]     [phone]             [facility] [address] [place], [place] [phone] 90 day commitment/SA/Application process  [facility](men only)     [address]     [place], [place]     [phone]       Check-in at [facility] [address] [place], [place] [address] [phone] Men/Women/Women and Children must be there by 7 pm  [facility] [place], [place] [phone]

## 2024-05-23 NOTE — ED Provider Notes (Signed)
 [facility]-EMERGENCY DEPT [facility] Emergency Department Provider Note MRN:  [id number]  Arrival date & time: 05/23/24     Chief Complaint   No chief complaint on file.   History of Present Illness   Mark Hammond is a 33 y.o. year-old male presents to the ED with chief complaint of suicidal and homicidal thoughts.  He is also requesting detox.  States that his left hip has been bothering him.  States that he had a fall around 2 AM.  States that this worsened his left hip pain.  Denies any treatments prior to arrival..  History provided by patient.   Review of Systems  Pertinent positive and negative review of systems noted in HPI.    Physical Exam   Vitals:   05/23/24 0329 05/23/24 0331  BP:  125/69  Pulse:  (!) 101  Resp:  18  Temp:  98.1 F (36.7 C)  SpO2: 96% 94%    CONSTITUTIONAL:  intoxicated-appearing, NAD NEURO:  Alert and oriented x 3, CN 3-12 grossly intact EYES:  eyes equal and reactive,  left periorbital hematoma  ENT/NECK:  Supple, no stridor  CARDIO:  mildly tachycardic, regular rhythm, appears well-perfused  PULM:  No respiratory distress, C GI/GU:  non-distended,  MSK/SPINE:  No gross deformities, no edema, moves all extremities  SKIN:  no rash, atraumatic   *Additional and/or pertinent findings included in MDM below  Diagnostic and Interventional Summary    EKG Interpretation Date/Time:    Ventricular Rate:    PR Interval:    QRS Duration:    QT Interval:    QTC Calculation:   R Axis:      Text Interpretation:         Labs Reviewed  COMPREHENSIVE METABOLIC PANEL WITH GFR - Abnormal; Notable for the following components:      Result Value   Potassium 3.3 (*)    Glucose, Bld 105 (*)    Creatinine, Ser 0.57 (*)    Calcium 8.5 (*)    Albumin  3.4 (*)    AST 63 (*)    All other components within normal limits  CBC - Abnormal; Notable for the following components:   RBC 4.14 (*)    Hemoglobin 10.6 (*)    HCT 34.5 (*)    MCH 25.6 (*)     All other components within normal limits  ETHANOL    DG Hip Unilat W or Wo Pelvis 2-3 Views Left  Final Result      Medications - No data to display   Procedures  /  Critical Care Procedures  ED Course and Medical Decision Making  I have reviewed the triage vital signs, the nursing notes, and pertinent available records from the EMR.  Social Determinants Affecting Complexity of Care: Patient has no clinically significant social determinants affecting this chief complaint..   ED Course:    Medical Decision Making Amount and/or Complexity of Data Reviewed Labs: ordered. Radiology: ordered.         Consultants: TTS consult pending.   Treatment and Plan: Patient signed out to oncoming team.    Final Clinical Impressions(s) / ED Diagnoses     ICD-10-CM   1. Polysubstance use disorder  F19.90     2. Suicidal ideations  R45.851       ED Discharge Orders     None         Discharge Instructions Discussed with and Provided to Patient:     Discharge Instructions  We saw you in the ER for your mental health concerns and had our behavioral health team evaluate you. The team feels comfortable sending you home, please follow the recommendations given to you, read the instructions provided.  You may consider going to behavioral health urgent care if symptoms are progressing again. Please refrain from substance abuse. Return to the ER if your symptoms worsen.   Substance Abuse Treatment Programs  Intensive Outpatient Programs [facility]     [address]      [place], [place]                   [phone]       [facility] [address] [place] [address] [place], [place] [phone]  [facility]     (Inpatient and outpatient)     [address]           [phone]    [facility] [phone] (Suboxone and Methadone)  [address]      [place], [place]  [address]      [phone]       [address] [place], [place] [phone]  [facility] (Outpatient/Inpatient, Chemical)    (insurance only) [phone]             [facility] (Groups & Residential) [place], [place] [phone]     [facility]     [address]     [place], [place]      [phone]       [facility] (for caregivers and family) [phone] [place], [place] [phone]      Residential Treatment Programs [facility]      [address] [place], [place] [address]  [phone]       [facility] [address], [place], [place] [address] [phone]  [facility] [place]        [phone]       [facility] [phone]  [facility] ([facility]             [address]                                         [place], [place]                                                [phone] or [phone]                               [facility] [address] [place], [address] [phone]  [facility]    [address]      [place], [place]     [phone]       [facility] [address] [place], [place] [phone]  [facility]   [address] [place], [place] [address]     [phone]      Admissions: 8am-3pm M-F  [facility] ([facility]) [address] [place], [place] [phone]  [facility]: Residential Program [phone] Days)   [place], [place]      [phone]  or [phone]     [facility]: [place]  [facility] [place], [place] (Walk in Hours over the weekend or by referral)  [facility] [address] [place], [place], [place] [address] [phone]  Crisis Mobile: [facility]:  [phone] (for crisis response 24 hours a day) [facility] Hotline:      [phone] Outpatient Psychiatry and Counseling  [facility]  [facility]: Mobile Crisis Management 24 hours:  [phone]  [facility] [employer] sliding scale fee and walk in schedule: M-F 8am-12pm/1pm-3pm [address]  [place], [place] [address] [phone]  [facility] [address] [place], [place] [address] [phone]  [facility] ([facility] [employer])- new patient walk-in appointments available Monday - Friday 8am -3pm.          [address] [place], [place] [address] [phone] or crisis line- [phone]  [facility] Services/ Intensive Outpatient Therapy Program [address] [place], [place] [address] [phone]  [facility]                  Crisis Services      [phone] [address]     [place], [place] [address]                 [facility]   [facility] [phone]. [address] [place], [place] [address]   [facility]          [address] [place]  [place], [place] [address]       [phone]  [facility] [address] [place], [place] [address] [phone]  [facility]    [address]    [place], [place] [address]     [phone]       Ima Anon, MD     [address]     [place] [address]     [phone]       [facility] [address] [place] [address]  [facility]     [address]     [place], [place]      [phone]       [facility], MD [address] [place], [place] [address] [phone]  [facility]     [address]     [place], [place] [address]     [phone]       [facility] [address] [place], [place] [address] [phone] Resources for Temporary Residential Assistance/Crisis Centers  DAY  CENTERS [facility]) M-F 8am-3pm   [address]  [place], [place] [address]   [phone] Services include: laundry, barbering, support groups, case management, phone  & computer access, showers, AA/NA mtgs, mental health/substance abuse nurse, job skills class, disability information, VA assistance, spiritual classes, etc.   HOMELESS SHELTERS  [facility]     [facility]   [address] [place]     [phone]              [employer] (women and children)       [address] [place], [place] [address] [phone] [email or web address] for application and process Application Required  [facility]   [address]    [place] [address]  [phone]                    [facility] [address] [place], [place] [address] [phone] application appt.) Application Required  [facility] (women only)    [address]     [place], [place] [address]     [phone]      Intake starts 6pm daily Need valid ID, SSC, & Police report [employer] [address] [place], [place] [phone] Application Required  [employer] (men only)     [address]      [place], [place]     [phone]       [facility] (Pregnant women only) [address] [place], [place] [phone]  [facility]      [address]      [place], [place] [address]     [phone]             [facility] [address] [place], [place] [phone] 90 day commitment/SA/Application process  [facility](men only)     [address]     [place], [place]     [phone]       Check-in at [facility] [address] [place], [place] [address] [phone] Men/Women/Women and Children must be there by 7 pm   [facility] [place], [place] [phone]                       Vicky Charleston, PA-C 05/23/24 2201    Theadore Ozell HERO, MD 05/24/24 [phone]

## 2024-05-23 NOTE — ED Notes (Signed)
 [facility] provided pt with resources for outpatient drug and alcohol treatment, shelter, food programs, and outpatient medical and mental health treatment.   Chesley Holt, [facility]  05/23/24

## 2024-06-02 ENCOUNTER — Other Ambulatory Visit: Payer: Self-pay

## 2024-06-02 ENCOUNTER — Emergency Department (HOSPITAL_COMMUNITY)
Admission: EM | Admit: 2024-06-02 | Discharge: 2024-06-02 | Disposition: A | Discharge status: Home / Self Care | Payer: MEDICAID | Attending: Emergency Medicine | Admitting: Emergency Medicine

## 2024-06-02 ENCOUNTER — Encounter (HOSPITAL_COMMUNITY): Payer: Self-pay

## 2024-06-02 ENCOUNTER — Emergency Department (HOSPITAL_COMMUNITY): Payer: MEDICAID

## 2024-06-02 DIAGNOSIS — F1420 Cocaine dependence, uncomplicated: Secondary | ICD-10-CM | POA: Diagnosis present

## 2024-06-02 DIAGNOSIS — Z720 Tobacco use: Secondary | ICD-10-CM | POA: Insufficient documentation

## 2024-06-02 DIAGNOSIS — F1910 Other psychoactive substance abuse, uncomplicated: Secondary | ICD-10-CM | POA: Insufficient documentation

## 2024-06-02 DIAGNOSIS — W01198A Fall on same level from slipping, tripping and stumbling with subsequent striking against other object, initial encounter: Secondary | ICD-10-CM | POA: Diagnosis not present

## 2024-06-02 DIAGNOSIS — Y92480 Sidewalk as the place of occurrence of the external cause: Secondary | ICD-10-CM | POA: Diagnosis not present

## 2024-06-02 DIAGNOSIS — Z765 Malingerer [conscious simulation]: Secondary | ICD-10-CM | POA: Diagnosis present

## 2024-06-02 DIAGNOSIS — Z5900 Homelessness unspecified: Secondary | ICD-10-CM | POA: Insufficient documentation

## 2024-06-02 DIAGNOSIS — I10 Essential (primary) hypertension: Secondary | ICD-10-CM | POA: Insufficient documentation

## 2024-06-02 DIAGNOSIS — W19XXXA Unspecified fall, initial encounter: Secondary | ICD-10-CM

## 2024-06-02 DIAGNOSIS — H1132 Conjunctival hemorrhage, left eye: Secondary | ICD-10-CM

## 2024-06-02 DIAGNOSIS — T401X1A Poisoning by heroin, accidental (unintentional), initial encounter: Secondary | ICD-10-CM | POA: Insufficient documentation

## 2024-06-02 DIAGNOSIS — S0990XA Unspecified injury of head, initial encounter: Secondary | ICD-10-CM | POA: Insufficient documentation

## 2024-06-02 DIAGNOSIS — S0512XD Contusion of eyeball and orbital tissues, left eye, subsequent encounter: Secondary | ICD-10-CM | POA: Insufficient documentation

## 2024-06-02 DIAGNOSIS — R6889 Other general symptoms and signs: Secondary | ICD-10-CM

## 2024-06-02 DIAGNOSIS — F1990 Other psychoactive substance use, unspecified, uncomplicated: Secondary | ICD-10-CM

## 2024-06-02 DIAGNOSIS — W19XXXD Unspecified fall, subsequent encounter: Secondary | ICD-10-CM | POA: Insufficient documentation

## 2024-06-02 DIAGNOSIS — S0001XA Abrasion of scalp, initial encounter: Secondary | ICD-10-CM

## 2024-06-02 DIAGNOSIS — F1120 Opioid dependence, uncomplicated: Secondary | ICD-10-CM | POA: Diagnosis present

## 2024-06-02 LAB — CBC
HCT: 39.3 % (ref 39.0–52.0)
Hemoglobin: 12.2 g/dL — ABNORMAL LOW (ref 13.0–17.0)
MCH: 25.1 pg — ABNORMAL LOW (ref 26.0–34.0)
MCHC: 31 g/dL (ref 30.0–36.0)
MCV: 80.7 fL (ref 80.0–100.0)
Platelets: 284 K/uL (ref 150–400)
RBC: 4.87 MIL/uL (ref 4.22–5.81)
RDW: 14.9 % (ref 11.5–15.5)
WBC: 8.7 K/uL (ref 4.0–10.5)
nRBC: 0 % (ref 0.0–0.2)

## 2024-06-02 LAB — COMPREHENSIVE METABOLIC PANEL WITH GFR
ALT: 86 U/L — ABNORMAL HIGH (ref 0–44)
AST: 155 U/L — ABNORMAL HIGH (ref 15–41)
Albumin: 3.7 g/dL (ref 3.5–5.0)
Alkaline Phosphatase: 111 U/L (ref 38–126)
Anion gap: 14 (ref 5–15)
BUN: <5 mg/dL — ABNORMAL LOW (ref 6–20)
CO2: 24 mmol/L (ref 22–32)
Calcium: 9 mg/dL (ref 8.9–10.3)
Chloride: 96 mmol/L — ABNORMAL LOW (ref 98–111)
Creatinine, Ser: 0.63 mg/dL (ref 0.61–1.24)
GFR, Estimated: >60 mL/min (ref >60)
Glucose, Bld: 123 mg/dL — ABNORMAL HIGH (ref 70–99)
Potassium: 3.4 mmol/L — ABNORMAL LOW (ref 3.5–5.1)
Sodium: 134 mmol/L — ABNORMAL LOW (ref 135–145)
Total Bilirubin: 1.1 mg/dL (ref 0.0–1.2)
Total Protein: 8.6 g/dL — ABNORMAL HIGH (ref 6.5–8.1)

## 2024-06-02 LAB — RAPID URINE DRUG SCREEN, HOSP PERFORMED
Amphetamines: POSITIVE — AB
Barbiturates: NOT DETECTED
Benzodiazepines: POSITIVE — AB
Cocaine: POSITIVE — AB
Opiates: NOT DETECTED
Tetrahydrocannabinol: POSITIVE — AB

## 2024-06-02 LAB — ETHANOL: Alcohol, Ethyl (B): <15 mg/dL (ref <15)

## 2024-06-02 MED ORDER — BACITRACIN ZINC 500 UNIT/GM EX OINT: TOPICAL_OINTMENT | Freq: Two times a day (BID) | CUTANEOUS | Status: DC

## 2024-06-02 MED ORDER — ERYTHROMYCIN 5 MG/GM OP OINT: TOPICAL_OINTMENT | OPHTHALMIC | 0 refills | Status: DC

## 2024-06-02 MED ORDER — LACTATED RINGERS IV BOLUS
1000.0000 mL | Freq: Once | INTRAVENOUS | Status: AC
  Administered 2024-06-02: 1000 mL via INTRAVENOUS

## 2024-06-02 MED ORDER — ACETAMINOPHEN 500 MG PO TABS
1000.0000 mg | ORAL_TABLET | Freq: Once | ORAL | Status: AC
  Administered 2024-06-02: 1000 mg via ORAL
  Filled 2024-06-02: qty 2

## 2024-06-02 NOTE — ED Triage Notes (Signed)
 [place]. Was at [facility] last night for overdose on heroin. C/O of withdrawals today and wanting help with getting some sleep and getting sober   EMS vitals  BP 136 palpated  HR 105 SPO2 98

## 2024-06-02 NOTE — ED Notes (Addendum)
 Pt arrives to [facility] bed dressed out, belongings placed in locker #3 by NT, wanded by security per triage RN. Charge Information systems manager ordered for Recruitment consultant

## 2024-06-02 NOTE — ED Notes (Addendum)
 Charge RN Warren ordered Recruitment consultant per patient.

## 2024-06-02 NOTE — ED Notes (Signed)
 Awaiting patient lobby.

## 2024-06-02 NOTE — ED Notes (Signed)
 Pt provided with sandwhich bag and drink.

## 2024-06-02 NOTE — ED Notes (Signed)
 Prior to discharge patient states he is unable to walk without walker. Pt states walker was where ems had found him . No walker was brought to hospital.  This RN witnessed patient walking without walker when he first arrived. MD notified and provided patient with paper script for walker.

## 2024-06-02 NOTE — Discharge Instructions (Addendum)
 Your vital signs are stable in the emergency department today, you do not show signs of being in active withdrawal, I recommend that you follow-up with the behavioral health urgent care or return to the emergency department if you begin to have more severe withdrawal symptoms.

## 2024-06-02 NOTE — ED Notes (Signed)
 Called staffing for an update on Recruitment consultant for this pt, April from staffing informed this RN that there are no sitters available

## 2024-06-02 NOTE — ED Triage Notes (Signed)
 Pt c/o SI and HI for awhile and thoughts have worsened today. Pt states he had a knife earlier but didn't use it. Pt states he does have auditory and visual hallucinations.

## 2024-06-02 NOTE — ED Notes (Signed)
 Pt verbalized understanding of discharge instructions, stated that he was going to stay away from the drugs. Pt was given bus pass that he requested.

## 2024-06-02 NOTE — Consult Note (Signed)
 [facility] Psychiatric Consult Initial  Patient Name: .Mark Hammond  MRN: [id number]  DOB: [birth year]  Consult Order details:  Orders (From admission, onward)     Start     Ordered   06/02/24 1512  CONSULT TO CALL ACT TEAM       Ordering Provider: Vicky Charleston, PA-C  Provider:  (Not yet assigned)  Question:  Reason for Consult?  Answer:  Psych consult   06/02/24 1511   06/02/24 1512  IP CONSULT TO PSYCHIATRY       Ordering Provider: Vicky Charleston, PA-C  Provider:  (Not yet assigned)  Question:  Reason for consult:  Answer:  Medication management   06/02/24 1511             Mode of Visit: In person    Psychiatry Consult Evaluation  Service Date: June 02, 2024 LOS:  LOS: 0 days  Chief Complaint: Suicidal/homicidal ideations   Primary Psychiatric Diagnoses  Malingering 2.   Polysubstance abuse (HCC)  Assessment   Mark Hammond is a 33 y.o. Hispanic male with a past psychiatric history of malingering, polysubstance abuse (I.e., methamphetamines, heroin, cocaine , EtOH, cannabis), antisocial behavior, MDD, bipolar, schizoaffective disorder, and schizophrenia, with pertinent medical comorbidities/history that includes history of cellulitis, osteomyelitis of left hip with left hip replacement 05/25, and hepatitis C, who initially presented by way of EMS this early morning 08/31 for an accidental heroin overdose and was subsequently discharged, who after being discharged a few short hours later, presented back by way of EMS from the local train station to the nearby [place] sister facility [facility] for complaints of withdrawal symptoms from illicit substance use, a need for some place to sleep, and help getting sober, who upon EDP evaluation determined patient was appropriate for discharge to follow-up with outpatient substance abuse resources, who then no less than a few short hours later, presented back to the emergency department at [facility] by way of self, for the now  current chief complaint and concern of suicidal and homicidal ideations, and experiencing auditory/visual hallucinations, who upon EDP evaluation, consulted psychiatry for specialty evaluation and recommendations.  Patient is currently medically clear, as well as voluntary, per EDP team.  Upon evaluation, patient presents with symptomology that is most consistent with malingering and polysubstance abuse.   During evaluation, patient endorses that in the context of a need for shelter and housing, patient has presented to the emergency department for secondary gain, and given previous endorsements/testimony, as a means of obtaining such secondary gain. Patient endorses severe polysubstance abuse in the form of heavily abusing amphetamines, cocaine , and heroin, of which is supported by urine drug screen, which is revealed to be positive for the aforementioned illicit substances.  From evaluation conducted, there is no evidence that the patient is an imminent risk to himself or others, decompensated into psychosis or mania, present with concerns for active intoxication on illicit substances and/or EtOH causing a concern for safety of the patient or others, presenting with concern for lack of capacity and or delirium, and/or presenting with concerns for unsafe withdrawal from illicit substances, endorsed to be utilizing heavily.  During evaluation, patient reveals that if shelter housing is able to be facilitated for him, he would like to discharge to shelter housing, versus his initial endorsed desires of seeking inpatient mental health hospitalization, as a means of secondary gain. During evaluation, it was discussed with patient that this provider is aware of a housing slot that is available for him at [facility], to which patient endorsed that he was amenable to safe discharge to stay at this sober living facility. Discussed with patient that he could additionally consider following  up with [facility] residential substance abuse program, if/when he is amenable.  Given the above, recommendation at this time is for psychiatric clearance, as well as additional recommendations listed below.  Spoke with Dr. Goli who is in agreement with recommendation for psychiatric clearance, as well as additional recommendations listed below.  Diagnoses:  Active Hospital problems: Principal Problem:   Malingering Active Problems:   Polysubstance abuse (HCC)    Plan   #Malingering #Polysubstance abuse Mark Hammond)  ## Psychiatric Recommendations:   - Recommend TOC consult for help facilitating transportation to sober living housing and or [facility] residential substance abuse treatment, if/when amenable - Recommend patient abstain from illicit substances endorsed to be utilizing - Recommend safety return precautions - Recommend patient consider close psychiatric outpatient follow-up with medication management/therapy services, if/when amenable  ## Medical Decision Making Capacity: Not specifically addressed in this encounter  ## Further Work-up: None at this time  ## Disposition:-- There are no psychiatric contraindications to discharge at this time  ## Behavioral / Environmental: -Routine agitation/safety precautions until discharge; safety return precautions upon discharge    ## Safety and Observation Level:  - Based on my clinical evaluation, I estimate the patient to be at low risk of self harm in the current setting and upon recommendation for discharge. - At this time, we recommend  routine. This decision is based on my review of the chart including patient's history and current presentation, interview of the patient, mental status examination, and consideration of suicide risk including evaluating suicidal ideation, plan, intent, suicidal or self-harm behaviors, risk factors, and protective factors. This judgment is based on our ability to directly address suicide risk, implement  suicide prevention strategies, and develop a safety plan while the patient is in the clinical setting. Please contact our team if there is a concern that risk level has changed.  CSSR Risk Category:C-SSRS RISK CATEGORY: High Risk  Suicide Risk Assessment: Patient has following modifiable risk factors for suicide: social isolation, medication noncompliance, lack of access to outpatient mental health resources, active mental illness (to encompass adhd, tbi, mania, psychosis, trauma reaction), current symptoms: anxiety/panic, insomnia, impulsivity, anhedonia, hopelessness, triggering events, recent psychiatric hospitalization, and pain, medical illness (ie new dx of cancer), which we are addressing by recommendations. Patient has following non-modifiable or demographic risk factors for suicide: male gender, history of suicide attempt, history of self harm behavior, and psychiatric hospitalization Patient has the following protective factors against suicide: Access to outpatient mental health care and Frustration tolerance  Thank you for this consult request. Recommendations have been communicated to the primary team.  We will sign off at this time.   Jerel JINNY Gravely, NP      History of Present Illness   Foye Damron is a 33 y.o. Hispanic male with a past psychiatric history of malingering, polysubstance abuse (I.e., methamphetamines, heroin, cocaine , EtOH, cannabis), antisocial behavior, MDD, bipolar, schizoaffective disorder, and schizophrenia, with pertinent medical comorbidities/history that includes history of cellulitis, osteomyelitis of left hip with left hip replacement 05/25, and hepatitis C, who initially presented by way of EMS this early morning 08/31 for an accidental heroin overdose and was subsequently discharged, who after being discharged a few short hours later, presented back by way of EMS from the local train station to the nearby [place] [facility] sister [facility] for complaints  of withdrawal symptoms from illicit substance use, a need for some place to sleep, and help getting sober, who upon EDP evaluation determined patient was appropriate for discharge to follow-up with outpatient substance abuse resources, who then no less than a few short hours later, presented back to the emergency department at [facility] by way of self, for the now current chief complaint and concern of suicidal and homicidal ideations, and experiencing auditory/visual hallucinations, who upon EDP evaluation, consulted psychiatry for specialty evaluation and recommendations.  Patient is currently medically clear, as well as voluntary, per EDP team.  Patient seen today at the [facility] emergency department for face-to-face psychiatric evaluation.  Patient is notably well-known to the behavioral health service line, as well as this provider.   Upon evaluation, patient immediately endorses that, man I know you, look, I'm just going to shoot you straight, I'm dying out here man, I'm tired of being homeless, I need help man, I need some place to get away from all these drugs, and to try to get back on my feet.   Prompted to expand, patient endorses that, I know you're not going to admit me man, so I'm just not even going to play no games, I need to get help with staying away from all of these bad individuals I do drugs with, some place that has individuals that don't do drugs and can help me do better, I'm tired of being homeless.   Prompted to clarify, patient expands and confesses at this time, that his previous endorsements of suicidal and homicidal ideations, with experiences of auditory/visual hallucinations, have been as a means of seeking help, and states, I am not going to pull any wool over your eyes man, you already know my situation, but please, can you help me do better?.  Prompted to discuss patient's desire to do better, as well as his recent substance abuse, patient endorses that he has  been heavily abusing amphetamines, cannabis, and cocaine  in various heavy amounts from whatever I can panhandle man, and endorses that specifically he feels like he would do well if he was helped with obtaining sober living housing. UDS appreciably positive for benzodiazepines, amphetamines, cocaine , and cannabis; BAL unremarkable.  Discussed with patient at this time that this provider is actually aware of an open slot available at a sober living house here in [place] called [facility], to which patient endorses at this time he would really like to pursue this, versus he states his original desire of attempting to obtain admissions.  Patient endorses formally again that he is not experiencing suicidal or homicidal ideations, as well as denies auditory or visual hallucinations, and objectively, does not appear to be presenting with psychotic features and/or mania.  Patient orientation is intact, no concerns for fluctuations in consciousness.  Patient endorses his mood formally as, I have been better man, and gives a halfhearted smile with largely neutral affect, with fair eye contact, and congruent interpersonal style.  Patient endorses that he is not taking any psychiatric medications currently, and endorses desire currently is he states do not focus on taking medications, but rather getting housing this provider has discussed with the patient.  Patient endorses he continues and remains without outpatient service utilization for his mental health and/or substance abuse.  Patient endorses no withdrawal symptomology subjectively, and upon physical examination, does not appear to be intoxicated on illicit substances and/or EtOH, and or presenting with withdrawal symptomology that is appreciable, and  the patient notably endorses he has not been using EtOH as of recently.  Discussed with patient that given his endorsements of malingering for secondary gain, as a means of obtaining housing,  recommendation would be for psychiatric clearance, but that patient could be helped facilitated in transporting to sober living housing he has been accepted to, as well as given resources and recommendations upon discharge, to which patient verbalized he was amenable to this.  Review of Systems  Psychiatric/Behavioral:  Positive for substance abuse (Polysubstance abuse, though denies ETOH use recently). Negative for depression, hallucinations and suicidal ideas. The patient is not nervous/anxious and does not have insomnia.   All other systems reviewed and are negative.    Psychiatric and Social History  Psychiatric History:  Information collected from Chart review/patient   Prev Dx/Sx: As above Current Psych Provider: None Home Meds (current): None Previous Med Trials: Multiple Therapy: None  Prior Psych Hospitalization: Multiple, most recent, 04/2024 Prior Self Harm: Yes Prior Violence: Yes  Family Psych History: None endorsed Family Hx suicide: None endorsed  Social History:  Developmental Hx: None endorsed Educational Hx: None endorsed Occupational Hx: Unemployed, utilizes Estate manager/land agent Hx: Multiple charges for drugs and IVCs Living Situation: Homeless (chronically) Spiritual Hx: None endorsed  Access to weapons/lethal means: Pocket Knife   Substance History Alcohol: Hx of abuse, none recently  Tobacco: Daily Illicit drugs: Polysubstance abuse (cocaine , meth, heroin, cannabis)  Prescription drug abuse: Hx of abuse of multiple prescribed abuse Rehab hx: Multiple, most recent 2025  Exam Findings  Physical Exam: As below  Vital Signs:  Temp:  [97.9 F (36.6 C)-98.4 F (36.9 C)] 98.4 F (36.9 C) (08/31 1337) Pulse Rate:  [93-112] 107 (08/31 1337) Resp:  [16-23] 16 (08/31 1337) BP: (116-145)/(70-98) 145/91 (08/31 1337) SpO2:  [97 %-99 %] 97 % (08/31 1337) Weight:  [61.2 kg-62.6 kg] 61.2 kg (08/31 1335) Blood pressure (!) 145/91, pulse (!) 107, temperature 98.4 F  (36.9 C), resp. rate 16, height 5' 4 (1.626 m), weight 61.2 kg, SpO2 97%. Body mass index is 23.17 kg/m.  Physical Exam Vitals and nursing note reviewed.  Constitutional:      General: He is not in acute distress.    Appearance: He is normal weight. He is not ill-appearing, toxic-appearing or diaphoretic.  Pulmonary:     Effort: Pulmonary effort is normal.  Skin:    General: Skin is warm and dry.  Neurological:     Mental Status: He is alert and oriented to person, place, and time.  Psychiatric:        Attention and Perception: Attention and perception normal. He does not perceive auditory or visual hallucinations.        Mood and Affect: Mood and affect normal.        Speech: Speech normal.        Behavior: Behavior is cooperative.        Thought Content: Thought content normal. Thought content is not paranoid or delusional. Thought content does not include homicidal or suicidal ideation.        Cognition and Memory: Cognition and memory normal.        Judgment: Judgment normal.     Mental Status Exam: General Appearance: Disheveled Hispanic male in scrubs  Orientation:  Full (Time, Place, and Person)  Memory:  Immediate;   Fair Recent;   Fair Remote;   Fair  Concentration:  Concentration: Fair and Attention Span: Fair  Recall:  Fair  Attention  Fair  Eye Contact:  Fair  Speech:  Clear and Coherent and Normal Rate  Language:  Fair  Volume:  Normal  Mood: I've been better man  Affect:  Neutral  Thought Process:  Coherent, Goal Directed, and Linear  Thought Content:  Logical  Suicidal Thoughts:  No  Homicidal Thoughts:  No  Judgement:  Intact  Insight:  Lacking, Present, and Shallow  Psychomotor Activity:  Normal  Akathisia:  No  Fund of Knowledge:  Fair      Assets:  Manufacturing systems engineer Desire for Improvement Financial Resources/Insurance Leisure Time Physical Health Resilience Talents/Skills Vocational/Educational  Cognition:  WNL  ADL's:  Intact   AIMS (if indicated):   0     Other History   These have been pulled in through the EMR, reviewed, and updated if appropriate.  Family History:  The patient's family history includes Stroke in an other family member.  Medical History: Past Medical History:  Diagnosis Date   Alcoholic gastritis    Bipolar 1 disorder (HCC)    Dental abscess    Depression    Drug abuse (HCC)    ETOH abuse    Hallucination    Hypertension    Schizo affective schizophrenia (HCC)    Substance abuse (HCC)     Surgical History: Past Surgical History:  Procedure Laterality Date   TOTAL HIP ARTHROPLASTY Left 02/23/2024   Procedure: ARTHROPLASTY, HIP, TOTAL,POSTERIOR APPROACH;  Surgeon: Jerri Kay HERO, MD;  Location: [facility] OR;  Service: Orthopedics;  Laterality: Left;  LEFT POSTERIOR HIP RESECTION ARTHROPLASTY WITH ANTIBIOTIC SPACER     Medications:  No current facility-administered medications for this encounter.  Current Outpatient Medications:    ARIPiprazole  (ABILIFY ) 5 MG tablet, Take 5 mg by mouth daily., Disp: , Rfl:    erythromycin  ophthalmic ointment, Place a 1/2 inch ribbon of ointment into the lower eyelid BID., Disp: 3.5 g, Rfl: 0   hydrOXYzine  (ATARAX ) 25 MG tablet, Take 25 mg by mouth every 6 (six) hours as needed for anxiety. (Patient not taking: Reported on 05/23/2024), Disp: , Rfl:    naproxen  (NAPROSYN ) 375 MG tablet, Take 1 tablet (375 mg total) by mouth 2 (two) times daily. (Patient not taking: Reported on 05/23/2024), Disp: 20 tablet, Rfl: 0   QUEtiapine  (SEROQUEL ) 50 MG tablet, Take 50 mg by mouth at bedtime., Disp: , Rfl:    sertraline  (ZOLOFT ) 50 MG tablet, Take 50 mg by mouth daily., Disp: , Rfl:   Allergies: Allergies  Allergen Reactions   Codeine Other (See Comments)    Unknown childhood reaction   Fish Allergy Other (See Comments)    Patient does not wish to eat ANY fish- reaction unclear   Shellfish Allergy Other (See Comments)    Patient does not wish to eat ANY  shellfish- reaction unclear    Jerel JINNY Gravely, NP

## 2024-06-02 NOTE — ED Provider Notes (Signed)
 [name] EMERGENCY DEPARTMENT AT [facility] Provider Note   CSN: [id number] Arrival date & time: 06/02/24  0501     Patient presents with: Drug Overdose   Mark Hammond is a 33 y.o. male.    Drug Overdose Pertinent negatives include no chest pain and no shortness of breath.       Prior to Admission medications   Medication Sig Start Date End Date Taking? Authorizing Provider  ARIPiprazole  (ABILIFY ) 5 MG tablet Take 5 mg by mouth daily.    [provider]  hydrOXYzine  (ATARAX ) 25 MG tablet Take 25 mg by mouth every 6 (six) hours as needed for anxiety. Patient not taking: Reported on 05/23/2024    [provider]  naproxen  (NAPROSYN ) 375 MG tablet Take 1 tablet (375 mg total) by mouth 2 (two) times daily. Patient not taking: Reported on 05/23/2024 05/14/24   Kommor, Lum, MD  QUEtiapine  (SEROQUEL ) 50 MG tablet Take 50 mg by mouth at bedtime.    [provider]  sertraline  (ZOLOFT ) 50 MG tablet Take 50 mg by mouth daily.    [provider]    Allergies: Codeine, Fish allergy, and Shellfish allergy    Review of Systems  Constitutional:  Negative for fever.  Respiratory:  Negative for cough, shortness of breath, wheezing and stridor.   Cardiovascular:  Negative for chest pain and leg swelling.  Psychiatric/Behavioral:  Negative for self-injury and suicidal ideas. The patient is not nervous/anxious.   All other systems reviewed and are negative.   Updated Vital Signs BP 121/70   Pulse (!) 112   Temp 98.1 F (36.7 C)   Resp 20   Ht 5' 4 (1.626 m)   Wt 62.6 kg   SpO2 99%   BMI 23.69 kg/m   Physical Exam Vitals and nursing note reviewed.  Constitutional:      General: He is not in acute distress.    Appearance: Normal appearance. He is well-developed. He is not diaphoretic.  HENT:     Head: Normocephalic and atraumatic.     Nose: Nose normal.  Eyes:     Conjunctiva/sclera: Conjunctivae normal.     Pupils: Pupils  are equal, round, and reactive to light.  Cardiovascular:     Rate and Rhythm: Normal rate and regular rhythm.     Pulses: Normal pulses.     Heart sounds: Normal heart sounds.  Pulmonary:     Effort: Pulmonary effort is normal.     Breath sounds: Normal breath sounds. No wheezing or rales.  Abdominal:     General: Bowel sounds are normal.     Palpations: Abdomen is soft.     Tenderness: There is no abdominal tenderness. There is no guarding or rebound.  Musculoskeletal:        General: Normal range of motion.     Cervical back: Normal range of motion and neck supple.  Skin:    General: Skin is warm and dry.     Capillary Refill: Capillary refill takes less than 2 seconds.  Neurological:     General: No focal deficit present.     Mental Status: He is alert and oriented to person, place, and time.     Deep Tendon Reflexes: Reflexes normal.  Psychiatric:        Mood and Affect: Mood normal. Mood is not depressed.        Behavior: Behavior is not agitated, withdrawn or combative.        Thought Content: Thought content  does not include suicidal ideation. Thought content does not include suicidal plan.     (all labs ordered are listed, but only abnormal results are displayed) Labs Reviewed - No data to display  EKG: None  Radiology: CT Cervical Spine Wo Contrast Result Date: 06/02/2024 CLINICAL DATA:  33 year old male status post fall. Drug use/overdose. Pain. EXAM: CT CERVICAL SPINE WITHOUT CONTRAST TECHNIQUE: Multidetector CT imaging of the cervical spine was performed without intravenous contrast. Multiplanar CT image reconstructions were also generated. RADIATION DOSE REDUCTION: This exam was performed according to the departmental dose-optimization program which includes automated exposure control, adjustment of the mA and/or kV according to patient size and/or use of iterative reconstruction technique. COMPARISON:  Head CT today.  Cervical spine CT 05/14/2024. FINDINGS:  Alignment: Stable mild straightening of lordosis. Cervicothoracic junction alignment is within normal limits. Bilateral posterior element alignment is within normal limits. Skull base and vertebrae: Bone mineralization is within normal limits. Visualized skull base is intact. No atlanto-occipital dissociation. C1 and C2 appear intact and aligned. No osseous abnormality identified. Soft tissues and spinal canal: No prevertebral fluid or swelling. No visible canal hematoma. Mildly elongated bilateral stylohyoid ligament calcification, incidental, can predispose to Eagle syndrome. Disc levels:  Negative. Upper chest: Visible upper thoracic levels appear intact. There is patchy and asymmetric lung apex ground-glass opacity greater on the left (series 4, image 100). IMPRESSION: 1. No acute traumatic injury identified in the cervical spine. 2. Asymmetric lung apex ground-glass opacity greater on the left, nonspecific but possibly noncardiogenic pulmonary edema in this setting. Electronically Signed   By: VEAR Hurst M.D.   On: 06/02/2024 05:46   CT Head Wo Contrast Result Date: 06/02/2024 CLINICAL DATA:  33 year old male status post fall. Drug use/overdose. Pain. EXAM: CT HEAD WITHOUT CONTRAST TECHNIQUE: Contiguous axial images were obtained from the base of the skull through the vertex without intravenous contrast. RADIATION DOSE REDUCTION: This exam was performed according to the departmental dose-optimization program which includes automated exposure control, adjustment of the mA and/or kV according to patient size and/or use of iterative reconstruction technique. COMPARISON:  Brain MRI 01/06/2024.  Head CT 05/14/2024. FINDINGS: Brain: Cerebral volume remains normal. No midline shift, ventriculomegaly, mass effect, evidence of mass lesion, intracranial hemorrhage or evidence of cortically based acute infarction. Gray-white matter differentiation is within normal limits throughout the brain. Vascular: No suspicious  intracranial vascular hyperdensity. Skull: Appears stable and intact. Sinuses/Orbits: Visualized paranasal sinuses and mastoids are stable and well aerated. Other: Anterior vertex scalp hematoma series 4, image 70. Underlying calvarium intact. No scalp soft tissue gas. Regressed but not fully resolved left periorbital soft tissue swelling since 05/14/2024. Globes and intraorbital soft tissues appear intact. Similar Disconjugate gaze. IMPRESSION: 1. Anterior vertex scalp hematoma. Regressed but not resolved left periorbital soft tissue swelling since 05/14/2024. No skull fracture. 2. Normal noncontrast CT appearance of the brain. Electronically Signed   By: VEAR Hurst M.D.   On: 06/02/2024 05:43     Procedures   Medications Ordered in the ED  acetaminophen  (TYLENOL ) tablet 1,000 mg (has no administration in time range)  lactated ringers  bolus 1,000 mL (1,000 mLs Intravenous New Bag/Given 06/02/24 0522)                                    Medical Decision Making Patient had a fall face forward after an accidental overdose on heroin, also crack use.tetanus up to date   Amount  and/or Complexity of Data Reviewed Independent Historian: EMS    Details: See above  Radiology: ordered and independent interpretation performed.    Details: Negative head CT  Risk OTC drugs. Risk Details: Patient reports this was an accidental overdose on several substances.  Tetanus is up to date.  Patient is awake, alert and PO challenging in the department.       Final diagnoses:  Accidental overdose of heroin, initial encounter (HCC)  Polysubstance use disorder    No signs of systemic illness or infection. The patient is nontoxic-appearing on exam and vital signs are within normal limits.  I have reviewed the triage vital signs and the nursing notes. Pertinent labs & imaging results that were available during my care of the patient were reviewed by me and considered in my medical decision making (see chart for  details). After history, exam, and medical workup I feel the patient has been appropriately medically screened and is safe for discharge home. Pertinent diagnoses were discussed with the patient. Patient was given return precautions.  ED Discharge Orders     None          Coolidge Gossard, MD 06/02/24 9441

## 2024-06-02 NOTE — ED Notes (Signed)
 [employer] has been called. Pt given belongings and discharge paperwork. Pt escorted to lobby by RN.

## 2024-06-02 NOTE — ED Notes (Addendum)
 Pt belonging's inventoried and placed in locker #3 and pt is dressed out
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
## 2024-06-02 NOTE — ED Notes (Signed)
 Pt vomited after drinking some soda

## 2024-07-11 ENCOUNTER — Emergency Department (HOSPITAL_COMMUNITY): Payer: MEDICAID

## 2024-07-11 ENCOUNTER — Emergency Department (HOSPITAL_COMMUNITY)
Admission: EM | Admit: 2024-07-11 | Discharge: 2024-07-11 | Disposition: A | Discharge status: Home / Self Care | Payer: MEDICAID

## 2024-07-11 ENCOUNTER — Encounter (HOSPITAL_COMMUNITY): Payer: Self-pay

## 2024-07-11 DIAGNOSIS — M25571 Pain in right ankle and joints of right foot: Secondary | ICD-10-CM | POA: Diagnosis present

## 2024-07-11 DIAGNOSIS — Z96642 Presence of left artificial hip joint: Secondary | ICD-10-CM | POA: Insufficient documentation

## 2024-07-11 DIAGNOSIS — I10 Essential (primary) hypertension: Secondary | ICD-10-CM | POA: Diagnosis not present

## 2024-07-11 DIAGNOSIS — X58XXXA Exposure to other specified factors, initial encounter: Secondary | ICD-10-CM | POA: Insufficient documentation
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
## 2024-07-11 NOTE — Discharge Instructions (Signed)
 You were evaluated emergency room for right ankle pain.  Your x-rays did not show any fracture.  Your exam is most consistent with a sprain.  You were fitted with an ankle brace.  You may use Tylenol  and ibuprofen  for pain.  Please call the number on the sheet to follow-up with orthopedics.

## 2024-07-13 ENCOUNTER — Emergency Department (HOSPITAL_COMMUNITY)
Admission: EM | Admit: 2024-07-13 | Discharge: 2024-07-13 | Disposition: A | Discharge status: Home / Self Care | Payer: MEDICAID | Attending: Emergency Medicine | Admitting: Emergency Medicine

## 2024-07-13 ENCOUNTER — Other Ambulatory Visit: Payer: Self-pay

## 2024-07-13 ENCOUNTER — Encounter (HOSPITAL_COMMUNITY): Payer: Self-pay | Admitting: Pharmacy Technician

## 2024-07-13 DIAGNOSIS — M25571 Pain in right ankle and joints of right foot: Secondary | ICD-10-CM | POA: Insufficient documentation

## 2024-07-13 DIAGNOSIS — R45850 Homicidal ideations: Secondary | ICD-10-CM | POA: Insufficient documentation

## 2024-07-13 DIAGNOSIS — Z765 Malingerer [conscious simulation]: Secondary | ICD-10-CM | POA: Diagnosis not present

## 2024-07-13 DIAGNOSIS — R45851 Suicidal ideations: Secondary | ICD-10-CM | POA: Insufficient documentation

## 2024-07-13 DIAGNOSIS — F1910 Other psychoactive substance abuse, uncomplicated: Secondary | ICD-10-CM | POA: Diagnosis present

## 2024-07-13 LAB — CBC
HCT: 39.7 % (ref 39.0–52.0)
Hemoglobin: 12.6 g/dL — ABNORMAL LOW (ref 13.0–17.0)
MCH: 25.6 pg — ABNORMAL LOW (ref 26.0–34.0)
MCHC: 31.7 g/dL (ref 30.0–36.0)
MCV: 80.7 fL (ref 80.0–100.0)
Platelets: 269 K/uL (ref 150–400)
RBC: 4.92 MIL/uL (ref 4.22–5.81)
RDW: 18.4 % — ABNORMAL HIGH (ref 11.5–15.5)
WBC: 5.2 K/uL (ref 4.0–10.5)
nRBC: 0 % (ref 0.0–0.2)

## 2024-07-13 LAB — COMPREHENSIVE METABOLIC PANEL WITH GFR
ALT: 74 U/L — ABNORMAL HIGH (ref 0–44)
AST: 87 U/L — ABNORMAL HIGH (ref 15–41)
Albumin: 3.3 g/dL — ABNORMAL LOW (ref 3.5–5.0)
Alkaline Phosphatase: 150 U/L — ABNORMAL HIGH (ref 38–126)
Anion gap: 12 (ref 5–15)
BUN: <5 mg/dL — ABNORMAL LOW (ref 6–20)
CO2: 25 mmol/L (ref 22–32)
Calcium: 8.6 mg/dL — ABNORMAL LOW (ref 8.9–10.3)
Chloride: 98 mmol/L (ref 98–111)
Creatinine, Ser: 0.5 mg/dL — ABNORMAL LOW (ref 0.61–1.24)
GFR, Estimated: >60 mL/min (ref >60)
Glucose, Bld: 105 mg/dL — ABNORMAL HIGH (ref 70–99)
Potassium: 3.6 mmol/L (ref 3.5–5.1)
Sodium: 135 mmol/L (ref 135–145)
Total Bilirubin: 0.6 mg/dL (ref 0.0–1.2)
Total Protein: 7.7 g/dL (ref 6.5–8.1)

## 2024-07-13 LAB — ACETAMINOPHEN LEVEL: Acetaminophen (Tylenol), Serum: <10 ug/mL — ABNORMAL LOW (ref 10–30)

## 2024-07-13 LAB — ETHANOL: Alcohol, Ethyl (B): <15 mg/dL (ref <15)

## 2024-07-13 LAB — SALICYLATE LEVEL: Salicylate Lvl: <7 mg/dL — ABNORMAL LOW (ref 7.0–30.0)

## 2024-07-13 MED ORDER — KETOROLAC TROMETHAMINE 15 MG/ML IJ SOLN
15.0000 mg | Freq: Once | INTRAMUSCULAR | Status: AC
  Administered 2024-07-13: 15 mg via INTRAMUSCULAR
  Filled 2024-07-13: qty 1

## 2024-07-13 MED ORDER — ACETAMINOPHEN 325 MG PO TABS: 650.0000 mg | ORAL_TABLET | ORAL | Status: DC | PRN
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
## 2024-07-13 NOTE — ED Notes (Signed)
 [facility] [employer] called

## 2024-07-13 NOTE — ED Provider Notes (Signed)
 Patient cleared by behavioral health.  They are recommending TOC consult for [facility].   Loletha Bertini, MD 07/13/24 1143

## 2024-07-13 NOTE — ED Notes (Signed)
Unable to provide urine

## 2024-07-13 NOTE — ED Provider Notes (Signed)
 [place] EMERGENCY DEPARTMENT AT [facility] Provider Note   CSN: [id number] Arrival date & time: 07/13/24  0715     Patient presents with: Suicidal, Homicidal, and Addiction Problem   Markice Torbert is a 33 y.o. male patient with past medical history of major depressive disorder, severe methamphetamine use disorder, severe alcohol dependence, hepatitis C, bipolar 1 presents to emergency room with complaint of suicidal ideation.  Apparently patient took a large dose of heroin and possibly did crack cocaine  yesterday to try to kill himself.  He reports he has a plan of trying to stab himself in the neck.  He reports he wants help with addiction and he is concerned he will go into alcohol withdrawal.  He does admit to drinking alcohol daily including 4 to 5 40 ounces of beer a day.    HPI     Prior to Admission medications   Medication Sig Start Date End Date Taking? Authorizing Provider  ARIPiprazole  (ABILIFY ) 5 MG tablet Take 5 mg by mouth daily.    [provider]  erythromycin  ophthalmic ointment Place a 1/2 inch ribbon of ointment into the lower eyelid BID. 06/02/24   Palumbo, April, MD  hydrOXYzine  (ATARAX ) 25 MG tablet Take 25 mg by mouth every 6 (six) hours as needed for anxiety. Patient not taking: Reported on 05/23/2024    [provider]  naproxen  (NAPROSYN ) 375 MG tablet Take 1 tablet (375 mg total) by mouth 2 (two) times daily. Patient not taking: Reported on 05/23/2024 05/14/24   Kommor, Lum, MD  QUEtiapine  (SEROQUEL ) 50 MG tablet Take 50 mg by mouth at bedtime.    [provider]  sertraline  (ZOLOFT ) 50 MG tablet Take 50 mg by mouth daily.    [provider]    Allergies: Codeine, Fish allergy, and Shellfish allergy    Review of Systems  Psychiatric/Behavioral:  Positive for agitation.     Updated Vital Signs BP 127/71 (BP Location: Left Arm)   Pulse 77   Temp 98.1 F (36.7 C)   Resp 18   SpO2 100%   Physical  Exam Vitals and nursing note reviewed.  Constitutional:      General: He is not in acute distress.    Appearance: He is not toxic-appearing.  HENT:     Head: Normocephalic and atraumatic.  Eyes:     General: No scleral icterus.    Conjunctiva/sclera: Conjunctivae normal.  Cardiovascular:     Rate and Rhythm: Normal rate and regular rhythm.     Pulses: Normal pulses.     Heart sounds: Normal heart sounds.  Pulmonary:     Effort: Pulmonary effort is normal. No respiratory distress.     Breath sounds: Normal breath sounds.  Abdominal:     General: Abdomen is flat. Bowel sounds are normal.     Palpations: Abdomen is soft.     Tenderness: There is no abdominal tenderness.  Musculoskeletal:     Comments: Right ankle pain, Neurovas intact.   Skin:    General: Skin is warm and dry.     Findings: No lesion.  Neurological:     General: No focal deficit present.     Mental Status: He is alert and oriented to person, place, and time. Mental status is at baseline.     (all labs ordered are listed, but only abnormal results are displayed) Labs Reviewed  COMPREHENSIVE METABOLIC PANEL WITH GFR - Abnormal; Notable for the following components:      Result Value  Glucose, Bld 105 (*)    BUN <5 (*)    Creatinine, Ser 0.50 (*)    Calcium 8.6 (*)    Albumin  3.3 (*)    AST 87 (*)    ALT 74 (*)    Alkaline Phosphatase 150 (*)    All other components within normal limits  CBC - Abnormal; Notable for the following components:   Hemoglobin 12.6 (*)    MCH 25.6 (*)    RDW 18.4 (*)    All other components within normal limits  ETHANOL  RAPID URINE DRUG SCREEN, HOSP PERFORMED  SALICYLATE LEVEL  ACETAMINOPHEN  LEVEL    EKG: None  Radiology: No results found.   Procedures   Medications Ordered in the ED - No data to display  Clinical Course as of 07/13/24 1024  Sat Jul 13, 2024  1024 AST(!): 87 [JB]  1024 ALT(!): 74 [JB]  1024 Alkaline Phosphatase(!): 150 [JB]  1024 Alcohol,  Ethyl (B): <15 [JB]    Clinical Course User Index [JB] Xhaiden Coombs, Warren SAILOR, PA-C                                 Medical Decision Making Amount and/or Complexity of Data Reviewed Labs: ordered. Decision-making details documented in ED Course.  Risk OTC drugs. Prescription drug management.   This patient presents to the ED for concern of SI, this involves an extensive number of treatment options, and is a complaint that carries with it a high risk of complications and morbidity.  The differential diagnosis includes medication side effect, drug abuse, suicidal ideation   Co morbidities that complicate the patient evaluation  Drug abuse, bipolar disorder, major depressive disorder, suicidal ideation   Additional history obtained:  Additional history obtained from patient was seen 2 days ago for complaint of ankle pain.  Patient had x-ray which showed no acute bony abnormality but mild lateral soft tissue swelling.   Lab Tests:  I personally interpreted labs.  The pertinent results include:   No significant leukocytosis. CMP remarkable for LFT elevation, normal total bilirubin and normal [place] kidney function.    Cardiac Monitoring: / EKG:  The patient was maintained on a cardiac monitor.  I personally viewed and interpreted the cardiac monitored which showed an underlying rhythm of: Normal sinus rhythm   Consultations Obtained:  I requested consultation with the [facility] for recommendations.   Problem List / ED Course / Critical interventions / Medication management  Patient hemodynamically stable and well-appearing.  Does report ankle pain but this was x-rayed yesterday.  He is neurovascularly intact.  He otherwise has no complaints.  I did discuss his elevated liver enzymes with him.  He does have history of substance abuse.  He does have SI with plan.  He is calm and voluntary.  Feel he would benefit from psychiatric evaluation he has medically cleared. I ordered  medication including Toradol  for pain control. Reevaluation of the patient after these medicines showed that the patient improved I have reviewed the patients home medicines and have made adjustments as needed. Patient is medically cleared to speak to behavioral health for psychiatric disposition.        Final diagnoses:  Polysubstance abuse [facility])  Suicidal ideations    ED Discharge Orders     None          Shermon Warren SAILOR, PA-C 07/13/24 1032    Tegeler, Lonni PARAS, MD 07/13/24 1511

## 2024-07-13 NOTE — ED Notes (Signed)
Security at bedside, pt wanded  

## 2024-07-13 NOTE — Discharge Instructions (Signed)
 Please adhere to safety return precautions discussed today Please consider abstaining from illicit substance use endorsed to be utilizing Please consider consider close outpatient follow-up with psychiatric outpatient medication management/therapy/substance abuse services, if/when amenable, after desire to residential substance abuse treatment program completion

## 2024-07-13 NOTE — ED Triage Notes (Signed)
 Pt bib ems with reports of SI, HI, and addiction issues. Per ems, pt took a large dose of heroin along with crack cocaine , ETOH and Marijuana yesterday in an attempt to kill himself. Pt was also carrying around a metal object and states that if someone spoke to him he was going to stab them and himself. Pt wanting help for addiction as well. VSS with ems. EMS reports pt has been calm and cooperative with them. Reports he feels like he is going into withdrawal. Also complains of R ankle pain and L leg pain. Reports he drinks 4-5 40oz beers daily.

## 2024-07-22 ENCOUNTER — Emergency Department (HOSPITAL_COMMUNITY)
Admission: EM | Admit: 2024-07-22 | Discharge: 2024-07-22 | Disposition: A | Discharge status: Home / Self Care | Payer: MEDICAID | Attending: Emergency Medicine | Admitting: Emergency Medicine

## 2024-07-22 ENCOUNTER — Other Ambulatory Visit: Payer: Self-pay

## 2024-07-22 ENCOUNTER — Emergency Department (HOSPITAL_COMMUNITY): Payer: MEDICAID

## 2024-07-22 ENCOUNTER — Encounter (HOSPITAL_COMMUNITY): Payer: Self-pay

## 2024-07-22 DIAGNOSIS — M79605 Pain in left leg: Secondary | ICD-10-CM | POA: Diagnosis present

## 2024-07-22 DIAGNOSIS — I10 Essential (primary) hypertension: Secondary | ICD-10-CM | POA: Insufficient documentation

## 2024-07-22 DIAGNOSIS — W19XXXA Unspecified fall, initial encounter: Secondary | ICD-10-CM

## 2024-07-22 DIAGNOSIS — W010XXA Fall on same level from slipping, tripping and stumbling without subsequent striking against object, initial encounter: Secondary | ICD-10-CM | POA: Diagnosis not present
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
## 2024-07-22 NOTE — ED Provider Notes (Signed)
 [place] EMERGENCY DEPARTMENT AT [facility] Provider Note   CSN: [id number] Arrival date & time: 07/22/24  9256     Patient presents with: No chief complaint on file.   Mark Hammond is a 33 y.o. male.   The history is provided by the patient and medical records. No language interpreter was used.  Fall This is a new problem. The current episode started yesterday. The problem has not changed since onset.Pertinent negatives include no chest pain, no abdominal pain, no headaches and no shortness of breath. Nothing aggravates the symptoms. Nothing relieves the symptoms. He has tried nothing for the symptoms. The treatment provided no relief.       Prior to Admission medications   Medication Sig Start Date End Date Taking? Authorizing Provider  ARIPiprazole  (ABILIFY ) 5 MG tablet Take 5 mg by mouth daily.    [provider]  erythromycin  ophthalmic ointment Place a 1/2 inch ribbon of ointment into the lower eyelid BID. 06/02/24   Palumbo, April, MD  hydrOXYzine  (ATARAX ) 25 MG tablet Take 25 mg by mouth every 6 (six) hours as needed for anxiety. Patient not taking: Reported on 05/23/2024    [provider]  naproxen  (NAPROSYN ) 375 MG tablet Take 1 tablet (375 mg total) by mouth 2 (two) times daily. Patient not taking: Reported on 05/23/2024 05/14/24   Kommor, Lum, MD  QUEtiapine  (SEROQUEL ) 50 MG tablet Take 50 mg by mouth at bedtime.    [provider]  sertraline  (ZOLOFT ) 50 MG tablet Take 50 mg by mouth daily.    [provider]    Allergies: Codeine, Fish allergy, and Shellfish allergy    Review of Systems  Constitutional:  Negative for chills, fatigue and fever.  HENT:  Negative for congestion.   Respiratory:  Negative for cough, chest tightness and shortness of breath.   Cardiovascular:  Negative for chest pain.  Gastrointestinal:  Negative for abdominal pain, constipation, diarrhea, nausea and vomiting.  Genitourinary:  Negative  for flank pain.  Musculoskeletal:  Negative for back pain and neck pain.  Skin:  Negative for rash.  Neurological:  Negative for weakness, numbness and headaches.  Psychiatric/Behavioral:  Negative for agitation.   All other systems reviewed and are negative.   Updated Vital Signs There were no vitals taken for this visit.  Physical Exam Vitals and nursing note reviewed.  Constitutional:      General: He is not in acute distress.    Appearance: He is well-developed. He is not ill-appearing, toxic-appearing or diaphoretic.  HENT:     Head: Normocephalic and atraumatic.     Nose: No congestion or rhinorrhea.     Mouth/Throat:     Pharynx: No oropharyngeal exudate.  Eyes:     Extraocular Movements: Extraocular movements intact.     Conjunctiva/sclera: Conjunctivae normal.     Pupils: Pupils are equal, round, and reactive to light.  Cardiovascular:     Rate and Rhythm: Normal rate and regular rhythm.     Heart sounds: No murmur heard. Pulmonary:     Effort: Pulmonary effort is normal. No respiratory distress.     Breath sounds: Normal breath sounds. No wheezing, rhonchi or rales.  Chest:     Chest wall: No tenderness.  Abdominal:     Palpations: Abdomen is soft.     Tenderness: There is no abdominal tenderness. There is no guarding or rebound.  Musculoskeletal:        General: Tenderness present. No swelling.  Cervical back: Neck supple. No tenderness.     Left hip: Tenderness present.     Left upper leg: Tenderness present.     Left knee: Tenderness present.     Right lower leg: No edema.     Left lower leg: No edema.       Legs:     Comments: In her nose and left hip, left thigh, and left knee.  Intact sensation strength and pulse distally.  No back tenderness or abdominal tenderness.  Skin:    General: Skin is warm and dry.     Capillary Refill: Capillary refill takes less than 2 seconds.     Findings: No erythema.  Neurological:     Mental Status: He is alert.      Sensory: No sensory deficit.     Motor: No weakness.     (all labs ordered are listed, but only abnormal results are displayed) Labs Reviewed - No data to display  EKG: None  Radiology: DG Femur Min 2 Views Left Result Date: 07/22/2024 CLINICAL DATA:  Fall. EXAM: LEFT FEMUR 2 VIEWS COMPARISON:  05/23/2024. 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 No evidence of an acute fracture. IMPRESSION: 1. No evidence of an acute fracture. 2. 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 Electronically Signed   By: Newell Eke M.D.   On: 07/22/2024 10:14   DG Knee Complete 4 Views Left Result Date: 07/22/2024 CLINICAL DATA:  Fall. EXAM: LEFT KNEE - COMPLETE 4+ VIEW COMPARISON:  None Available. FINDINGS: No acute osseous or joint abnormality.  Periarticular osteopenia. IMPRESSION: No fracture or joint effusion. Electronically Signed   By: Newell Eke M.D.   On: 07/22/2024 10:12   DG Hip Unilat W or Wo Pelvis 2-3 Views Left Result Date: 07/22/2024 CLINICAL DATA:  Fall. EXAM: DG HIP (WITH OR WITHOUT PELVIS) 2-3V LEFT COMPARISON:  05/23/2024. FINDINGS: Left hip hemiarthroplasty with chronic superior subluxation of the femoral head component. Right hip is unremarkable. No acute findings. IMPRESSION: 1. No acute findings. 2. Left hip hemiarthroplasty with chronic superior subluxation of the femoral head. Electronically Signed   By: Newell Eke M.D.   On: 07/22/2024 10:12     Procedures   Medications Ordered in the ED - No data to display                                  Medical Decision Making Amount and/or Complexity of Data Reviewed Radiology: ordered.    Mark Hammond is a 33 y.o. male with a past medical history significant for seizure affective disorder, bipolar disorder, polysubstance abuse, hypertension, depression, previous  left hip arthroplasty, and previous right ankle sprain who presents with fall and left leg pain.  According to patient, he had a mechanical fall yesterday and tripped and fell and hit his left hip.  He reports he has pain in his left hip, left thigh, and left knee but has no other injuries or complaints.  Denies any pain in his ankle or foot.  Denies any injury to his abdomen, chest, back, head, or neck.  I asked if he had any mental health or psychiatric concerns today and he tells me no and he is mainly focused on the leg injuries we will do so.  Otherwise he had no complaints and did not report fevers, chills,  chest, cough, nausea, vomiting, constipation, diarrhea, or urinary changes.  On exam: Clear.  Chest nontender.  Abdomen nontender.  Back nontender.  He has some pain and tenderness to left hip palpation as well as manipulation as well as tenderness in his left thigh and left knee.  He was able to range the left knee.  Distally had intact sensation strength and pulse.  No big laceration seen.  Otherwise patient well-appearing and resting.  Vital signs reassuring.  Based on the lack of other complaints or injuries or concerns to the patient, we will focus on the left leg injury after this fall.  Will get x-ray of the left knee, left femur, and left hip.  Will hold on back imaging given his lack of back pain and lack of any numbness or weakness in the leg.  Will hold on labs.  Given his telling me that he has no mental health concerns at this time and being psychiatrically cleared in the last few weeks, we will hold on psychiatric evaluation as he is not complaining of it now.  If imaging is reassuring, anticipate discharge for outpatient follow-up.        11:31 AM X-rays show no evidence of acute abnormality impaired to prior and no acute fracture.  Patient agrees with plan for discharge home and outpatient follow-up.  No other questions or concerns and was discharged in stable condition.       Final diagnoses:  Left leg pain  Fall, initial encounter    ED Discharge Orders     None       Clinical Impression: 1. Left leg pain   2. Fall, initial encounter     Disposition: Discharge  Condition: Good  I have discussed the results, Dx and Tx plan with the pt(& family if present). He/she/they expressed understanding and agree(s) with the plan. Discharge instructions discussed at great length. Strict return precautions discussed and pt &/or family have verbalized understanding of the instructions. No further questions at time of discharge.    New Prescriptions   No medications on file    Follow Up: [facility] [address] [place] [address] [place]  [id number] [phone] Schedule an appointment as soon as possible for a visit    your orthopedic surgeon        Jhovani Griswold, Lonni PARAS, MD 07/22/24 1132

## 2024-07-22 NOTE — Discharge Instructions (Signed)
 Your history, exam, and evaluation today did not show evidence of acute fracture or change in your chronic left hip subluxation.  No evidence of acute bony injury at this time.  I suspect soft tissue and muscle skeletal pain from a fall.  Please be careful walking and follow-up with both your primary doctor and your previous orthopedist.  If any symptoms change or worsen acutely, return to the nearest emergency department.  Please over-the-counter medications to help with your symptoms.

## 2024-07-22 NOTE — ED Notes (Signed)
 When asked by provider, patient denies having suicidal ideation.

## 2024-08-05 ENCOUNTER — Encounter: Payer: Self-pay | Admitting: Radiology

## 2024-08-08 ENCOUNTER — Emergency Department (HOSPITAL_COMMUNITY)
Admission: EM | Admit: 2024-08-08 | Discharge: 2024-08-08 | Disposition: A | Discharge status: Home / Self Care | Payer: MEDICAID | Attending: Emergency Medicine | Admitting: Emergency Medicine

## 2024-08-08 ENCOUNTER — Emergency Department (HOSPITAL_COMMUNITY): Payer: MEDICAID

## 2024-08-08 DIAGNOSIS — I10 Essential (primary) hypertension: Secondary | ICD-10-CM | POA: Insufficient documentation

## 2024-08-08 DIAGNOSIS — Z5900 Homelessness unspecified: Secondary | ICD-10-CM | POA: Insufficient documentation

## 2024-08-08 DIAGNOSIS — Y901 Blood alcohol level of 20-39 mg/100 ml: Secondary | ICD-10-CM | POA: Diagnosis not present

## 2024-08-08 DIAGNOSIS — M25552 Pain in left hip: Secondary | ICD-10-CM | POA: Insufficient documentation

## 2024-08-08 DIAGNOSIS — R7401 Elevation of levels of liver transaminase levels: Secondary | ICD-10-CM | POA: Diagnosis not present

## 2024-08-08 DIAGNOSIS — F1910 Other psychoactive substance abuse, uncomplicated: Secondary | ICD-10-CM | POA: Diagnosis not present

## 2024-08-08 DIAGNOSIS — F1990 Other psychoactive substance use, unspecified, uncomplicated: Secondary | ICD-10-CM

## 2024-08-08 LAB — CBC
HCT: 41.8 % (ref 39.0–52.0)
Hemoglobin: 13 g/dL (ref 13.0–17.0)
MCH: 25.6 pg — ABNORMAL LOW (ref 26.0–34.0)
MCHC: 31.1 g/dL (ref 30.0–36.0)
MCV: 82.3 fL (ref 80.0–100.0)
Platelets: 253 K/uL (ref 150–400)
RBC: 5.08 MIL/uL (ref 4.22–5.81)
RDW: 18.8 % — ABNORMAL HIGH (ref 11.5–15.5)
WBC: 4.6 K/uL (ref 4.0–10.5)
nRBC: 0 % (ref 0.0–0.2)

## 2024-08-08 LAB — COMPREHENSIVE METABOLIC PANEL WITH GFR
ALT: 107 U/L — ABNORMAL HIGH (ref 0–44)
AST: 188 U/L — ABNORMAL HIGH (ref 15–41)
Albumin: 4.1 g/dL (ref 3.5–5.0)
Alkaline Phosphatase: 160 U/L — ABNORMAL HIGH (ref 38–126)
Anion gap: 15 (ref 5–15)
BUN: 10 mg/dL (ref 6–20)
CO2: 24 mmol/L (ref 22–32)
Calcium: 9 mg/dL (ref 8.9–10.3)
Chloride: 99 mmol/L (ref 98–111)
Creatinine, Ser: 0.57 mg/dL — ABNORMAL LOW (ref 0.61–1.24)
GFR, Estimated: >60 mL/min (ref >60)
Glucose, Bld: 67 mg/dL — ABNORMAL LOW (ref 70–99)
Potassium: 4 mmol/L (ref 3.5–5.1)
Sodium: 138 mmol/L (ref 135–145)
Total Bilirubin: 0.5 mg/dL (ref 0.0–1.2)
Total Protein: 8.2 g/dL — ABNORMAL HIGH (ref 6.5–8.1)

## 2024-08-08 LAB — ETHANOL: Alcohol, Ethyl (B): 35 mg/dL — ABNORMAL HIGH (ref <15)
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
## 2024-08-08 NOTE — Discharge Instructions (Addendum)
 Avoid using drugs and alcohol.  Consider following up with a treatment center.  I also recommend following up with a primary care doctor to have your liver tests rechecked

## 2024-08-08 NOTE — ED Notes (Addendum)
 Pt keeps getting up coming to the nurses station asking different people for food. Pt had been told multiple times no and that his labs and x rays need to result.

## 2024-08-08 NOTE — ED Provider Notes (Signed)
 [place] EMERGENCY DEPARTMENT AT [facility] Provider Note   CSN: [id number] Arrival date & time: 08/08/24  1004     Patient presents with: Hip Pain and Psychiatric Evaluation   Mark Hammond is a 33 y.o. male.    Hip Pain     Patient has a history of schizoaffective disorder bipolar disorder depression substance abuse, hypertension.  Patient is unfortunately homeless.  He admits to using drugs and alcohol last evening.  The next thing he remembers is waking up this morning in the bushes.  Patient is not sure if how he got there.  He does not know if he fell.  Apparently a place of business called 911.  Patient is complaining of pain in his left hip and leg.  Patient denies any chest pain or shortness of breath.  No abdominal.  No headache no neck pain   Prior to Admission medications   Medication Sig Start Date End Date Taking? Authorizing Provider  ARIPiprazole  (ABILIFY ) 5 MG tablet Take 5 mg by mouth daily.    [provider]  erythromycin  ophthalmic ointment Place a 1/2 inch ribbon of ointment into the lower eyelid BID. 06/02/24   Palumbo, April, MD  hydrOXYzine  (ATARAX ) 25 MG tablet Take 25 mg by mouth every 6 (six) hours as needed for anxiety. Patient not taking: Reported on 05/23/2024    [provider]  naproxen  (NAPROSYN ) 375 MG tablet Take 1 tablet (375 mg total) by mouth 2 (two) times daily. Patient not taking: Reported on 05/23/2024 05/14/24   Kommor, Lum, MD  QUEtiapine  (SEROQUEL ) 50 MG tablet Take 50 mg by mouth at bedtime.    [provider]  sertraline  (ZOLOFT ) 50 MG tablet Take 50 mg by mouth daily.    [provider]    Allergies: Codeine, Fish allergy, and Shellfish allergy    Review of Systems  Updated Vital Signs BP 120/82   Pulse 78   Temp 97.8 F (36.6 C) (Oral)   Resp 16   SpO2 100%   Physical Exam Vitals and nursing note reviewed.  Constitutional:      Appearance: He is well-developed. He is not  diaphoretic.  HENT:     Head: Normocephalic and atraumatic.     Right Ear: External ear normal.     Left Ear: External ear normal.  Eyes:     General: No scleral icterus.       Right eye: No discharge.        Left eye: No discharge.     Conjunctiva/sclera: Conjunctivae normal.  Neck:     Trachea: No tracheal deviation.  Cardiovascular:     Rate and Rhythm: Normal rate and regular rhythm.  Pulmonary:     Effort: Pulmonary effort is normal. No respiratory distress.     Breath sounds: Normal breath sounds. No stridor. No wheezing or rales.  Abdominal:     General: Bowel sounds are normal. There is no distension.     Palpations: Abdomen is soft.     Tenderness: There is no abdominal tenderness. There is no guarding or rebound.  Musculoskeletal:        General: Tenderness present. No deformity.     Cervical back: Normal and neck supple.     Thoracic back: Normal.     Lumbar back: Normal.     Comments: Tenderness palpation upper extremities, no tenderness palpation right lower extremity, tenderness palpation right hip knee and tib-fib region, no erythema no deformity noted no abrasions or contusions  Skin:    General: Skin is warm and dry.     Findings: No rash.  Neurological:     General: No focal deficit present.     Mental Status: He is alert.     Cranial Nerves: No cranial nerve deficit, dysarthria or facial asymmetry.     Sensory: No sensory deficit.     Motor: No abnormal muscle tone or seizure activity.     Coordination: Coordination normal.  Psychiatric:        Mood and Affect: Mood normal.   No  (all labs ordered are listed, but only abnormal results are displayed) Labs Reviewed  COMPREHENSIVE METABOLIC PANEL WITH GFR - Abnormal; Notable for the following components:      Result Value   Glucose, Bld 67 (*)    Creatinine, Ser 0.57 (*)    Total Protein 8.2 (*)    AST 188 (*)    ALT 107 (*)    Alkaline Phosphatase 160 (*)    All other components within normal  limits  ETHANOL - Abnormal; Notable for the following components:   Alcohol, Ethyl (B) 35 (*)    All other components within normal limits  CBC - Abnormal; Notable for the following components:   MCH 25.6 (*)    RDW 18.8 (*)    All other components within normal limits    EKG: None  Radiology: DG Tibia/Fibula Left Result Date: 08/08/2024 EXAM: 2 VIEW(S) XRAY OF THE LEFT TIBIA AND FIBULA 08/08/2024 11:09:00 AM COMPARISON: None available. CLINICAL HISTORY: Pain, unclear trauma. FINDINGS: BONES AND JOINTS: No acute fracture. No focal osseous lesion. No joint dislocation. SOFT TISSUES: The soft tissues are unremarkable. IMPRESSION: 1. No significant abnormality. Electronically signed by: Lynwood Seip MD 08/08/2024 12:21 PM EST RP Workstation: [id number]   DG Knee 2 Views Left Result Date: 08/08/2024 EXAM: 1 OR 2 VIEW(S) XRAY OF THE LEFT KNEE 08/08/2024 11:09:00 AM COMPARISON: 07/22/2024 CLINICAL HISTORY: pain, unclear trauma FINDINGS: BONES AND JOINTS: No acute fracture. No focal osseous lesion. No joint dislocation. No significant joint effusion. No significant degenerative changes. SOFT TISSUES: The soft tissues are unremarkable. IMPRESSION: 1. No acute osseous abnormality identified. 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 CLINICAL HISTORY: pain, unclear trauma FINDINGS: BONES AND JOINTS: Status post left hip hemiarthroplasty and chronic superior subluxation of femoral component which is unchanged compared to prior exam. No acute fracture or focal osseous lesion. SOFT TISSUES: The soft tissues are unremarkable. IMPRESSION: 1. 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 Electronically signed by: Lynwood Seip MD 08/08/2024 12:16 PM EST RP Workstation: [id number]     Procedures   Medications Ordered in the ED - No data to display  Clinical Course as of 08/08/24 1246  Thu Aug 08, 2024  1220 Hip x-ray with chronic changes no acute findings [JK]  1220 Knee x-ray without acute findings [JK]  1236 cbc(!) CBC and metabolic panel unremarkable with exception of elevation of LFTs.  Patient is not having abdominal pain I suspect this is related to the alcohol use [JK]    Clinical Course User Index [JK] Randol Simmonds, MD  Medical Decision Making Amount and/or Complexity of Data Reviewed Labs: ordered. Decision-making details documented in ED Course. Radiology: ordered.    Provider Suicide Risk Assessment Note  Nursing Documentation C-SSRS RISK CATEGORY: High Risk     Suicide Risk Assessment:   Based on my clinical evaluation, I estimate the patient to be at acute low risk of self-harm in the current setting. This decision is based on my review of the chart including patient's history and current presentation, interview of the patient, mental status examination, and consideration of suicide risk including evaluating suicidal ideation, plan, intent, suicidal or self-harm behaviors, risk factors, and protective factors.  Patient has following modifiable risk factors for suicide: recklessness Patient has following non-modifiable or demographic risk factors for suicide:male gender Patient has the following protective factors against suicide: Access to outpatient mental health care and no history of suicide attempts Mitigation: Risk for suicide is being addressed by recommendations for: outpatient consult/treatment (low risk)  Patient presented to the ED primarily complaining of pain in his left hip.  Patient states he was out  using drugs and alcohol last evening.  He does not remember what exactly happened.  Patient denied any thoughts of harming himself depression or suicide to me.  His primary concern was his hip pain as well as getting something to eat and drink.  X-rays do not show any signs of serious injury.  Patient was provided something to eat and drink.  He is comfortable with discharge.    Final diagnoses:  Substance use disorder  Pain of left hip    ED Discharge Orders     None          Randol Simmonds, MD 08/08/24 1246

## 2024-08-11 ENCOUNTER — Other Ambulatory Visit: Payer: Self-pay

## 2024-08-11 ENCOUNTER — Emergency Department (HOSPITAL_COMMUNITY)
Admission: EM | Admit: 2024-08-11 | Discharge: 2024-08-11 | Disposition: A | Discharge status: Home / Self Care | Payer: MEDICAID | Attending: Emergency Medicine | Admitting: Emergency Medicine

## 2024-08-11 ENCOUNTER — Encounter (HOSPITAL_COMMUNITY): Payer: Self-pay

## 2024-08-11 DIAGNOSIS — T5191XA Toxic effect of unspecified alcohol, accidental (unintentional), initial encounter: Secondary | ICD-10-CM | POA: Insufficient documentation

## 2024-08-11 DIAGNOSIS — T405X1A Poisoning by cocaine, accidental (unintentional), initial encounter: Secondary | ICD-10-CM | POA: Insufficient documentation

## 2024-08-11 DIAGNOSIS — T40411A Poisoning by fentanyl or fentanyl analogs, accidental (unintentional), initial encounter: Secondary | ICD-10-CM | POA: Insufficient documentation

## 2024-08-11 DIAGNOSIS — T50901A Poisoning by unspecified drugs, medicaments and biological substances, accidental (unintentional), initial encounter: Secondary | ICD-10-CM

## 2024-08-11 MED ORDER — ONDANSETRON 4 MG PO TBDP
4.0000 mg | ORAL_TABLET | Freq: Once | ORAL | Status: AC
  Administered 2024-08-11: 4 mg via ORAL
  Filled 2024-08-11: qty 1

## 2024-08-11 NOTE — ED Provider Notes (Signed)
 [place] EMERGENCY DEPARTMENT AT [facility] Provider Note   CSN: [id number] Arrival date & time: 08/11/24  0434     History Chief Complaint  Patient presents with   Drug Overdose    HPI Mark Hammond is a 33 y.o. male presenting for accidental overdose.  States that he was hanging out with his friends and he just took all the drugs that they gave him.  Apparently they gave him alcohol, fentanyl , meth, cocaine .  He became unresponsive.  EMS was called as he was laying next to the road.  Patient's recorded medical, surgical, social, medication list and allergies were reviewed in the Snapshot window as part of the initial history.   Review of Systems   Review of Systems  Constitutional:  Negative for chills and fever.  HENT:  Negative for ear pain and sore throat.   Eyes:  Negative for pain and visual disturbance.  Respiratory:  Negative for cough and shortness of breath.   Cardiovascular:  Negative for chest pain and palpitations.  Gastrointestinal:  Negative for abdominal pain and vomiting.  Genitourinary:  Negative for dysuria and hematuria.  Musculoskeletal:  Negative for arthralgias and back pain.  Skin:  Negative for color change and rash.  Neurological:  Negative for seizures and syncope.  All other systems reviewed and are negative.   Physical Exam Updated Vital Signs BP 114/83 (BP Location: Right Arm)   Pulse 97   Temp 98 F (36.7 C)   Resp 14   Ht 5' 4 (1.626 m)   Wt 63.5 kg   SpO2 95%   BMI 24.03 kg/m  Physical Exam Vitals and nursing note reviewed.  Constitutional:      General: He is not in acute distress.    Appearance: He is well-developed.  HENT:     Head: Normocephalic and atraumatic.  Eyes:     Conjunctiva/sclera: Conjunctivae normal.  Cardiovascular:     Rate and Rhythm: Normal rate and regular rhythm.  Pulmonary:     Effort: Pulmonary effort is normal. No respiratory distress.  Abdominal:     General: Abdomen is flat. There is  no distension.  Musculoskeletal:        General: No swelling or deformity.  Skin:    General: Skin is warm and dry.     Capillary Refill: Capillary refill takes less than 2 seconds.  Neurological:     Mental Status: He is alert and oriented to person, place, and time. Mental status is at baseline.      ED Course/ Medical Decision Making/ A&P    Procedures Procedures   Medications Ordered in ED Medications - No data to display  Medical Decision Making:   33 year old male with accidental overdose.  Stated he was just trying to have a good time He is currently not sympathomimetic.  He is status post Narcan  an hour prior to arrival and appears to be well tolerating his airway. Will observe for 2 hours total and reassess. If maintaining awake patient was stable for discharge at that time   Disposition:  I have considered need for hospitalization, however, considering all of the above, I believe this patient is stable for discharge at this time.  Patient/family educated about specific return precautions for given chief complaint and symptoms.  Patient/family educated about follow-up with PCP.     Patient/family expressed understanding of return precautions and need for follow-up. Patient spoken to regarding all imaging and laboratory results and appropriate follow up for these results. All  education provided in verbal form with additional information in written form. Time was allowed for answering of patient questions. Patient discharged.    Emergency Department Medication Summary:   Medications - No data to display       Clinical Impression:  1. Accidental overdose, initial encounter      Data Unavailable   Final Clinical Impression(s) / ED Diagnoses Final diagnoses:  Accidental overdose, initial encounter    Rx / DC Orders ED Discharge Orders     None         Jerral Meth, MD 08/11/24 0500

## 2024-08-11 NOTE — ED Triage Notes (Signed)
 Pt BIB [facility] d/t overdose.  Bystander called EMS d/t pt being supine at the [employer].  Pt was breathing 4 BPM at the time .  EMS gave 2 mg IN @ 0405.  Pt stated he took Fentanyl , crack, meth, marijuana, alcohol.

## 2024-08-11 NOTE — Discharge Instructions (Signed)
 Stop doing drugs.  They are going to kill you as we discussed.  I prescribed Narcan  should you ever accidentally overdose in the future.  I attached a referral to behavioral urgent care.

## 2024-08-14 ENCOUNTER — Ambulatory Visit (HOSPITAL_COMMUNITY)
Admission: EM | Admit: 2024-08-14 | Discharge: 2024-08-14 | Disposition: A | Discharge status: Home / Self Care | Payer: MEDICAID | Attending: Nurse Practitioner | Admitting: Nurse Practitioner

## 2024-08-14 DIAGNOSIS — Z91148 Patient's other noncompliance with medication regimen for other reason: Secondary | ICD-10-CM | POA: Insufficient documentation

## 2024-08-14 DIAGNOSIS — Z765 Malingerer [conscious simulation]: Secondary | ICD-10-CM | POA: Insufficient documentation

## 2024-08-14 DIAGNOSIS — F1910 Other psychoactive substance abuse, uncomplicated: Secondary | ICD-10-CM | POA: Insufficient documentation

## 2024-08-14 NOTE — ED Provider Notes (Signed)
 Behavioral Health Urgent Care Medical Screening Exam  Patient Name: Mark Hammond MRN: [id number] Date of Evaluation: 08/14/24 Chief Complaint:  seeking shelter Diagnosis:  Final diagnoses:  Polysubstance abuse (HCC)  Malingering   History of Present illness: Mark Hammond is a 33 y.o. male with a history of several presentations to the ERs in the [facility] system with malingering type behaviors, who presents to the [facility] today asking for a place to stay, reporting polysubstance use over the past few days.  Assessment: During encounter with patient, he is malodorous, states that he used fentanyl  yesterday, states he graduated from the [facility] 2 times, and I am willing to do it again this time.  Patient reports that he is currently not compliant with his medication regimen, currently not seeing an outpatient mental health provider, does not have a therapist.  Escalates in agitation when writer questions why he typically does not follow through with medication management and therapy appointments when he presents to this location, and becomes belligerent stating: Get me the fuck out of here!. Pt denies SI, denies Hi, denies AVH, denies paranoia, denies any plans or intent to harm himself or any one else after discharge from the [facility].  Patient discharged with recommendations to follow up at the open access clinic here at the [facility] for continuity of mental health services. Also educated as follows: Get help right away if: You have thoughts about hurting yourself or others. Get help right away if you feel like you may hurt yourself or others, or have thoughts about taking your own life. Go to your nearest emergency room or: Call 911. Call the National Suicide Prevention Lifeline at 309-290-9835 or 988 in the [place] This is open 24 hours a day. If you're a Veteran: Call 988 and press 1. This is open 24 hours a day. Text the [employer] at  (856)623-2944. Summary Mental health is not just the absence of mental illness. It involves understanding your emotions and behaviors, and taking steps to manage them in a healthy way. If you have symptoms of mental or emotional distress, get help from family, friends, a health care provider, or a mental health professional. Practice good mental health behaviors such as stress management skills, self-calming skills, exercise, healthy sleeping and eating, and supportive relationships. This information is not intended to replace advice given to you by your health care provider. Make sure you discuss any questions you have with your health care provider.  Education provided on the fact that if experiencing worsening of psychiatry symptoms including suicidal ideations, homicidal ideations, or having auditory/visual hallucinations, etc, to call 911, 988, come back to this location, or go to the nearest ER. Pt verbalized understanding.   Patient refused to take AVS on discharge.  Flowsheet Row ED from 08/14/2024 in [facility] ED from 08/11/2024 in [facility] Emergency Department at [facility] ED from 08/08/2024 in [facility] Emergency Department at [facility]  C-SSRS RISK CATEGORY Error: Q7 should not be populated when Q6 is No No Risk High Risk    Psychiatric Specialty Exam  Presentation  General Appearance:Casual  Eye Contact:Fair  Speech:Clear and Coherent  Speech Volume:Normal  Handedness:Right   Mood and Affect  Mood: Euthymic  Affect: Congruent   Thought Process  Thought Processes: Coherent  Descriptions of Associations:Intact  Orientation:Full (Time, Place and Person)  Thought Content:WDL  Diagnosis of Schizophrenia or Schizoaffective disorder in past: Yes  Duration of Psychotic Symptoms: Greater than  six months  Hallucinations:None constant  Ideas of Reference:None  Suicidal Thoughts:No Without Intent  Homicidal  Thoughts:No   Sensorium  Memory: Immediate Fair  Judgment: Fair  Insight: Fair   Art Therapist  Concentration: Fair  Attention Span: Fair  Recall: [employer] of Knowledge: Fair  Language: Fair   Psychomotor Activity  Psychomotor Activity: Normal   Assets  Assets: Communication Skills   Sleep  Sleep: Good  Number of hours:  8   Physical Exam: Physical Exam Vitals and nursing note reviewed.  Constitutional:      Appearance: Normal appearance.  Musculoskeletal:     Cervical back: Normal range of motion.  Neurological:     General: No focal deficit present.     Mental Status: He is alert and oriented to person, place, and time.   ROS Blood pressure 125/89, pulse 98, temperature 98 F (36.7 C), temperature source Oral, resp. rate 20, SpO2 99%. There is no height or weight on file to calculate BMI.  Musculoskeletal: Strength & Muscle Tone: within normal limits Gait & Station: normal Patient leans: N/A  [facility] MSE Discharge Disposition for Follow up and Recommendations: Based on my evaluation the patient does not appear to have an emergency medical condition and can be discharged with resources and follow up care in outpatient services for Medication Management and Individual Therapy  Donia Snell, NP 08/14/2024, 10:38 AM

## 2024-08-14 NOTE — ED Notes (Signed)
 Patient discharged home. AVS provided and reviewed. Safety maintained.

## 2024-08-14 NOTE — Discharge Instructions (Signed)
 Get help right away if: You have thoughts about hurting yourself or others. Get help right away if you feel like you may hurt yourself or others, or have thoughts about taking your own life. Go to your nearest emergency room or: Call 911. Call the National Suicide Prevention Lifeline at (657) 719-9517 or 988 in the [place] This is open 24 hours a day. If you're a Veteran: Call 988 and press 1. This is open 24 hours a day. Text the [employer] at 820-704-6251. Summary Mental health is not just the absence of mental illness. It involves understanding your emotions and behaviors, and taking steps to manage them in a healthy way. If you have symptoms of mental or emotional distress, get help from family, friends, a health care provider, or a mental health professional. Practice good mental health behaviors such as stress management skills, self-calming skills, exercise, healthy sleeping and eating, and supportive relationships. This information is not intended to replace advice given to you by your health care provider. Make sure you discuss any questions you have with your health care provider.    Education provided on the fact that if experiencing worsening of psychiatry symptoms including suicidal ideations, homicidal ideations, or having auditory/visual hallucinations, etc, to call 911, 988, come back to this location, or go to the nearest ER. Pt verbalized understanding.

## 2024-08-14 NOTE — Progress Notes (Signed)
   08/14/24 0911  [facility] Triage Screening (Walk-ins at [facility] only)  What Is the Reason for Your Visit/Call Today? Mark Hammond is a 33 year old male presenting to [facility] unaccompanied. Pt states he is needing to be admitted. Per chart review, pt has a hx of malingering at [facility] in [place], here at the [facility] and has been to the ED 29 times in the past 6 months. Pt states he is homeless and needing a place to stay. Pt states he overdosed on Fentanyl  yesterday because his sister was murdered. Pt denies a plan to end his life at this time. Pt does endorse suicidal thoughts. Pt denies Substance use ibn the past 24 hours, Hi and AVH. Pt mentions that he continues to use substances when he get discharged from hospitals and is not sure how to get help. Pt is not taking medication or seeing a therapist at this time.  How Long Has This Been Causing You Problems? > than 6 months  Have You Recently Had Any Thoughts About Hurting Yourself? Yes  How long ago did you have thoughts about hurting yourself? today  Are You Planning to Commit Suicide/Harm Yourself At This time? No  Have you Recently Had Thoughts About Hurting Someone [name]? No  Are You Planning To Harm Someone At This Time? No  Physical Abuse Denies  Verbal Abuse Denies  Sexual Abuse Denies  Exploitation of patient/patient's resources Denies  Self-Neglect Denies  Possible abuse reported to: Other (Comment)  Are you currently experiencing any auditory, visual or other hallucinations? No  Have You Used Any Alcohol or Drugs in the Past 24 Hours? No  Do you have any current medical co-morbidities that require immediate attention? No  What Do You Feel Would Help You the Most Today? Medication(s)  If access to [facility] Urgent Care was not available, would you have sought care in the Emergency Department? No  Determination of Need Routine (7 days)  Options For Referral Intensive Outpatient Therapy

## 2024-08-26 ENCOUNTER — Other Ambulatory Visit (HOSPITAL_COMMUNITY)
Admission: EM | Admit: 2024-08-26 | Discharge: 2024-08-29 | Disposition: A | Discharge status: Home / Self Care | Payer: MEDICAID

## 2024-08-26 ENCOUNTER — Ambulatory Visit (HOSPITAL_COMMUNITY)
Admission: EM | Admit: 2024-08-26 | Discharge: 2024-08-26 | Disposition: A | Discharge status: Other Acute Inpatient Hospital | Payer: MEDICAID

## 2024-08-26 DIAGNOSIS — F10239 Alcohol dependence with withdrawal, unspecified: Secondary | ICD-10-CM | POA: Insufficient documentation

## 2024-08-26 DIAGNOSIS — F19282 Other psychoactive substance dependence with psychoactive substance-induced sleep disorder: Secondary | ICD-10-CM | POA: Insufficient documentation

## 2024-08-26 DIAGNOSIS — R440 Auditory hallucinations: Secondary | ICD-10-CM

## 2024-08-26 DIAGNOSIS — F1994 Other psychoactive substance use, unspecified with psychoactive substance-induced mood disorder: Secondary | ICD-10-CM

## 2024-08-26 DIAGNOSIS — F1924 Other psychoactive substance dependence with psychoactive substance-induced mood disorder: Secondary | ICD-10-CM | POA: Insufficient documentation

## 2024-08-26 DIAGNOSIS — F419 Anxiety disorder, unspecified: Secondary | ICD-10-CM | POA: Insufficient documentation

## 2024-08-26 DIAGNOSIS — G4700 Insomnia, unspecified: Secondary | ICD-10-CM | POA: Insufficient documentation

## 2024-08-26 DIAGNOSIS — F1090 Alcohol use, unspecified, uncomplicated: Secondary | ICD-10-CM

## 2024-08-26 DIAGNOSIS — R7989 Other specified abnormal findings of blood chemistry: Secondary | ICD-10-CM | POA: Insufficient documentation

## 2024-08-26 DIAGNOSIS — F1123 Opioid dependence with withdrawal: Secondary | ICD-10-CM | POA: Diagnosis not present

## 2024-08-26 DIAGNOSIS — F1220 Cannabis dependence, uncomplicated: Secondary | ICD-10-CM | POA: Diagnosis not present

## 2024-08-26 DIAGNOSIS — F1523 Other stimulant dependence with withdrawal: Secondary | ICD-10-CM | POA: Diagnosis not present

## 2024-08-26 DIAGNOSIS — F1020 Alcohol dependence, uncomplicated: Secondary | ICD-10-CM

## 2024-08-26 DIAGNOSIS — F1590 Other stimulant use, unspecified, uncomplicated: Secondary | ICD-10-CM

## 2024-08-26 DIAGNOSIS — F259 Schizoaffective disorder, unspecified: Secondary | ICD-10-CM | POA: Insufficient documentation

## 2024-08-26 DIAGNOSIS — F1114 Opioid abuse with opioid-induced mood disorder: Secondary | ICD-10-CM

## 2024-08-26 DIAGNOSIS — F1193 Opioid use, unspecified with withdrawal: Secondary | ICD-10-CM

## 2024-08-26 DIAGNOSIS — R748 Abnormal levels of other serum enzymes: Secondary | ICD-10-CM

## 2024-08-26 LAB — POCT URINE DRUG SCREEN - MANUAL ENTRY (I-SCREEN)
POC Amphetamine UR: NOT DETECTED
POC Buprenorphine (BUP): NOT DETECTED
POC Cocaine UR: POSITIVE — AB
POC Marijuana UR: POSITIVE — AB
POC Methadone UR: POSITIVE — AB
POC Methamphetamine UR: NOT DETECTED
POC Morphine: POSITIVE — AB
POC Oxazepam (BZO): POSITIVE — AB
POC Oxycodone UR: NOT DETECTED
POC Secobarbital (BAR): NOT DETECTED

## 2024-08-26 LAB — COMPREHENSIVE METABOLIC PANEL WITH GFR
ALT: 120 U/L — ABNORMAL HIGH (ref 0–44)
AST: 132 U/L — ABNORMAL HIGH (ref 15–41)
Albumin: 3.6 g/dL (ref 3.5–5.0)
Alkaline Phosphatase: 138 U/L — ABNORMAL HIGH (ref 38–126)
Anion gap: 10 (ref 5–15)
BUN: 6 mg/dL (ref 6–20)
CO2: 27 mmol/L (ref 22–32)
Calcium: 9.3 mg/dL (ref 8.9–10.3)
Chloride: 102 mmol/L (ref 98–111)
Creatinine, Ser: 0.63 mg/dL (ref 0.61–1.24)
GFR, Estimated: >60 mL/min (ref >60)
Glucose, Bld: 84 mg/dL (ref 70–99)
Potassium: 4.5 mmol/L (ref 3.5–5.1)
Sodium: 139 mmol/L (ref 135–145)
Total Bilirubin: 0.7 mg/dL (ref 0.0–1.2)
Total Protein: 7.8 g/dL (ref 6.5–8.1)

## 2024-08-26 LAB — CBC WITH DIFFERENTIAL/PLATELET
Abs Immature Granulocytes: 0.02 K/uL (ref 0.00–0.07)
Basophils Absolute: 0.1 K/uL (ref 0.0–0.1)
Basophils Relative: 1 %
Eosinophils Absolute: 0 K/uL (ref 0.0–0.5)
Eosinophils Relative: 1 %
HCT: 41.9 % (ref 39.0–52.0)
Hemoglobin: 13.3 g/dL (ref 13.0–17.0)
Immature Granulocytes: 0 %
Lymphocytes Relative: 23 %
Lymphs Abs: 1.4 K/uL (ref 0.7–4.0)
MCH: 25.9 pg — ABNORMAL LOW (ref 26.0–34.0)
MCHC: 31.7 g/dL (ref 30.0–36.0)
MCV: 81.5 fL (ref 80.0–100.0)
Monocytes Absolute: 0.7 K/uL (ref 0.1–1.0)
Monocytes Relative: 11 %
Neutro Abs: 3.8 K/uL (ref 1.7–7.7)
Neutrophils Relative %: 64 %
Platelets: 233 K/uL (ref 150–400)
RBC: 5.14 MIL/uL (ref 4.22–5.81)
RDW: 18.6 % — ABNORMAL HIGH (ref 11.5–15.5)
WBC: 5.9 K/uL (ref 4.0–10.5)
nRBC: 0 % (ref 0.0–0.2)

## 2024-08-26 LAB — ETHANOL: Alcohol, Ethyl (B): <15 mg/dL (ref <15)

## 2024-08-26 LAB — HEMOGLOBIN A1C
Hgb A1c MFr Bld: 5.6 % (ref 4.8–5.6)
Mean Plasma Glucose: 114 mg/dL

## 2024-08-26 MED ORDER — HALOPERIDOL 5 MG PO TABS: 5.0000 mg | ORAL_TABLET | Freq: Three times a day (TID) | ORAL | Status: DC | PRN

## 2024-08-26 MED ORDER — ACETAMINOPHEN 325 MG PO TABS
650.0000 mg | ORAL_TABLET | Freq: Four times a day (QID) | ORAL | Status: DC | PRN
  Administered 2024-08-27 – 2024-08-28 (×2): 650 mg via ORAL
  Filled 2024-08-26 (×2): qty 2

## 2024-08-26 MED ORDER — LOPERAMIDE HCL 2 MG PO CAPS: 2.0000 mg | ORAL_CAPSULE | ORAL | Status: DC | PRN

## 2024-08-26 MED ORDER — ACETAMINOPHEN 325 MG PO TABS
650.0000 mg | ORAL_TABLET | Freq: Two times a day (BID) | ORAL | Status: AC
  Administered 2024-08-26 (×2): 650 mg via ORAL
  Filled 2024-08-26 (×2): qty 2

## 2024-08-26 MED ORDER — DIPHENHYDRAMINE HCL 50 MG/ML IJ SOLN
50.0000 mg | Freq: Three times a day (TID) | INTRAMUSCULAR | Status: DC | PRN
Start: 2024-08-26 — End: 2024-08-29

## 2024-08-26 MED ORDER — DIPHENHYDRAMINE HCL 50 MG/ML IJ SOLN: 50.0000 mg | Freq: Three times a day (TID) | INTRAMUSCULAR | Status: DC | PRN

## 2024-08-26 MED ORDER — LORAZEPAM 2 MG/ML IJ SOLN
2.0000 mg | Freq: Three times a day (TID) | INTRAMUSCULAR | Status: DC | PRN
Start: 2024-08-26 — End: 2024-08-29

## 2024-08-26 MED ORDER — HYDROXYZINE HCL 25 MG PO TABS: 25.0000 mg | ORAL_TABLET | Freq: Three times a day (TID) | ORAL | Status: DC | PRN

## 2024-08-26 MED ORDER — ONDANSETRON 4 MG PO TBDP: 4.0000 mg | ORAL_TABLET | Freq: Four times a day (QID) | ORAL | Status: DC | PRN

## 2024-08-26 MED ORDER — QUETIAPINE FUMARATE 50 MG PO TABS
50.0000 mg | ORAL_TABLET | Freq: Two times a day (BID) | ORAL | Status: DC
  Administered 2024-08-26 – 2024-08-28 (×6): 50 mg via ORAL
  Filled 2024-08-26 (×6): qty 1

## 2024-08-26 MED ORDER — NICOTINE 21 MG/24HR TD PT24
21.0000 mg | MEDICATED_PATCH | Freq: Every day | TRANSDERMAL | Status: DC
  Filled 2024-08-26 (×2): qty 1

## 2024-08-26 MED ORDER — LORAZEPAM 2 MG/ML IJ SOLN: 2.0000 mg | Freq: Three times a day (TID) | INTRAMUSCULAR | Status: DC | PRN

## 2024-08-26 MED ORDER — MAGNESIUM HYDROXIDE 400 MG/5ML PO SUSP: 30.0000 mL | Freq: Every day | ORAL | Status: DC | PRN

## 2024-08-26 MED ORDER — ADULT MULTIVITAMIN W/MINERALS CH
1.0000 | ORAL_TABLET | Freq: Every day | ORAL | Status: DC
  Administered 2024-08-26 – 2024-08-28 (×3): 1 via ORAL
  Filled 2024-08-26 (×3): qty 1

## 2024-08-26 MED ORDER — DICYCLOMINE HCL 10 MG PO CAPS
10.0000 mg | ORAL_CAPSULE | Freq: Once | ORAL | Status: AC
  Administered 2024-08-26: 10 mg via ORAL
  Filled 2024-08-26: qty 1

## 2024-08-26 MED ORDER — ALUM & MAG HYDROXIDE-SIMETH 200-200-20 MG/5ML PO SUSP: 30.0000 mL | ORAL | Status: DC | PRN

## 2024-08-26 MED ORDER — LORAZEPAM 1 MG PO TABS: 1.0000 mg | ORAL_TABLET | ORAL | Status: DC | PRN

## 2024-08-26 MED ORDER — DM-GUAIFENESIN ER 30-600 MG PO TB12
1.0000 | ORAL_TABLET | Freq: Two times a day (BID) | ORAL | Status: AC
  Administered 2024-08-26 – 2024-08-27 (×4): 1 via ORAL
  Filled 2024-08-26 (×4): qty 1

## 2024-08-26 MED ORDER — IBUPROFEN 600 MG PO TABS
600.0000 mg | ORAL_TABLET | Freq: Two times a day (BID) | ORAL | Status: AC
  Administered 2024-08-26 (×2): 600 mg via ORAL
  Filled 2024-08-26 (×2): qty 1

## 2024-08-26 MED ORDER — DIPHENHYDRAMINE HCL 50 MG PO CAPS: 50.0000 mg | ORAL_CAPSULE | Freq: Three times a day (TID) | ORAL | Status: DC | PRN

## 2024-08-26 MED ORDER — LOPERAMIDE HCL 2 MG PO CAPS
4.0000 mg | ORAL_CAPSULE | Freq: Once | ORAL | Status: AC
  Administered 2024-08-26: 4 mg via ORAL
  Filled 2024-08-26: qty 2

## 2024-08-26 MED ORDER — HALOPERIDOL LACTATE 5 MG/ML IJ SOLN: 5.0000 mg | Freq: Three times a day (TID) | INTRAMUSCULAR | Status: DC | PRN

## 2024-08-26 MED ORDER — HALOPERIDOL LACTATE 5 MG/ML IJ SOLN: 10.0000 mg | Freq: Three times a day (TID) | INTRAMUSCULAR | Status: DC | PRN

## 2024-08-26 NOTE — ED Notes (Signed)
 RN called lab, add on lab test for A1c confirmed.

## 2024-08-26 NOTE — Care Management (Signed)
 [facility] Care Management  Writer went to the Client's room to discuss updates and to ask question regarding his treatment for after he leaves [facility].  Client reports I don't know.  Writer asked more in depth questions about residential treatment for 30 days and he responded  I don't know.  Writer informed the Client the typical amount of time to stay here is 3-5 days.  Client did not open his eyes throughout the conversation.  Client just stayed in the bed answering the questions with his eyes closed.   Writer will attempt to make contact with him later today or tomorrow morning to discuss his plan of care for after he's discharged from [facility].

## 2024-08-26 NOTE — Group Note (Signed)
 Group Topic: Wellness  Group Date: 08/26/2024 Start Time: 1200 End Time: 1230 Facilitators: Daved Tinnie HERO, RN  Department: [facility]  Number of Participants: 8  Group Focus: psychiatric education Treatment Modality:  Psychoeducation Interventions utilized were patient education Purpose: improve communication skills  Name: Mark Hammond Date of Birth: 1991/01/02  MR: [id number]    Level of Participation: pt was not admitted to unit at time of RN education group Plan: patient will be encouraged to attend future RN education groups   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   Polysubstance dependence (HCC) 12/13/2023   Polysubstance abuse (HCC) 11/21/2023   Alcohol use disorder 08/04/2023   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

## 2024-08-26 NOTE — ED Notes (Signed)
 Pt eating dinner in dayroom, no signs of distress observed.

## 2024-08-26 NOTE — Group Note (Signed)
 Group Topic: Overcoming Obstacles  Group Date: 08/26/2024 Start Time: 1300 End Time: 1345 Facilitators: Alyse Leilani LABOR, NT  Department: [facility]  Number of Participants: 9  Group Focus: chemical dependency education, chemical dependency issues, and goals/reality orientation Treatment Modality:  Skills Training Interventions utilized were group exercise, patient education, and problem solving Purpose: enhance coping skills, improve communication skills, increase insight, and relapse prevention strategies  Name: Mark Hammond Date of Birth: 1991/09/19  MR: [id number]    Pt just arrived to the unit  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   Polysubstance dependence (HCC) 12/13/2023   Polysubstance abuse (HCC) 11/21/2023   Alcohol use disorder 08/04/2023   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

## 2024-08-26 NOTE — ED Notes (Addendum)
 Pt oriented to unit, provided with lunch. Pt denies si hi avh- verbal contract for safety provided. Pt declined walker saying that he no longer uses one. Pt denies chronic pain , rather, he walks with a limp due to L leg being shorter than R r/t feb 2025 L hip replacement as a result of cellulitis.

## 2024-08-26 NOTE — ED Notes (Signed)
 RN spoke with patient A&Ox4. Denies intent to harm self/others when asked. Denies A/VH or any physical complaints when asked. Active listening, support and encouragement provided. Routine safety checks conducted according to facility protocol. Encouraged patient to notify staff if thoughts of harm toward self or others arise. Patient verbalize understanding and agreement.

## 2024-08-26 NOTE — ED Provider Notes (Signed)
 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  On the present occasion he presented to the [facility] requesting detox from substances.  He was admitted to the facility based crisis.  He reports diarrhea, cough, chest pain, shaking, temperature fluctuations, and fatigue. He reports that he was clean for a few days before having problems with his girlfriend, with whom he is currently living with. He states that he had used Codeine with Tylenol , marijuana, cocaine , and alcohol in the past few days. He believes he is currently withdrawing from alcohol. He reports drinking throughout the entire day, and does not keep count of how many drink he will have. Previous alcohol withdrawal symptoms include diarrhea, shakes, and cold sweats. He denies ever having seizures from alcohol withdrawal. He states that he had previous difficulty in this [facility] due to heroin withdrawals, which made him more irritable and unable to complete treatment. He currently does not believe his irritability will stand in the way of his treatment, and states that he will participate in treatment and group sessions.    The patient reports experiencing auditory hallucinations.  He describes these voices as spiritual warfare.  He reports sensitivity to loud noises, feeling that loud noises cause him to hear voices.  He denies command auditory hallucinations telling him to hurt himself or others. Pt denies SI, HI, visual hallucinations.     PHQ 2-9:  Flowsheet Row ED from 08/26/2024 in [facility] ED from 05/10/2024 in [facility] ED  from 01/14/2024 in [facility]  Thoughts that you would be better off dead, or of hurting yourself in some way Several days Not at all Several days  PHQ-9 Total Score 9 12 14     Flowsheet Row ED from 08/26/2024 in [facility] Most recent reading at 08/26/2024 12:46 PM ED from 08/26/2024 in [facility] Most recent reading at 08/26/2024  8:19 AM ED from 08/14/2024 in [facility] Most recent reading at 08/14/2024  9:25 AM  C-SSRS RISK CATEGORY Moderate Risk Error: Q3, 4, or 5 should not be populated when Q2 is No Error: Q7 should not be populated when Q6 is No    Screenings    Flowsheet Row Most Recent Value  CIWA-Ar Total 3    Total Time spent with patient: 20 min Psychiatric Specialty Exam: Physical Exam Constitutional:      Appearance: the patient is not toxic-appearing.  Pulmonary:     Effort: Pulmonary effort is normal.  Neurological:     General: No focal deficit present.     Mental Status: the patient is alert and oriented to person, place, and time.   Review of Systems  Respiratory:  Negative for shortness of breath.   Cardiovascular:  Negative for chest pain.  Gastrointestinal:  Negative for abdominal pain, constipation, diarrhea, nausea and vomiting.  Neurological:  Negative for headaches.      BP 118/63 (BP Location: Left Arm)   Pulse (!) 107   Temp 99.2 F (37.3 C) (Oral)   Resp 16   General Appearance: Fairly Groomed  Eye Contact:  Good  Speech:  Clear and Coherent  Volume:  Normal  Mood:  Euthymic  Affect:  Congruent  Thought Process:  Coherent  Orientation:  Full (Time, Place, and Person)  Thought Content: Logical   Suicidal Thoughts:  No  Homicidal Thoughts:  No  Memory:  Immediate;   Good  Judgement:  fair  Insight:  fair  Psychomotor Activity:  Normal  Concentration:  Concentration: Good  Recall:  Good  Fund of Knowledge: Good   Language: Good  Akathisia:  No  Handed:  not assessed  AIMS (if indicated): not done  Assets:  Communication Skills Desire for Improvement Leisure time Physical Health  ADL's:  Intact  Cognition: WNL      Blood pressure 118/63, pulse (!) 107, temperature 99.2 F (37.3 C), temperature source Oral, resp. rate 16. There is no height or weight on file to calculate BMI.  Past Psychiatric History: as above   Is the patient at risk to self? no Has the patient been a risk to self in the past 6 months? no Has the patient been a risk to self within the distant past? no Is the patient a risk to others? no Has the patient been a risk to others in the past 6 months? no Has the patient been a risk to others within the distant past? no  Past Medical History: as above Family History: as above Social History: no additional history, as above  Last Labs:  Admission on 08/26/2024, Discharged on 08/26/2024  Component Date Value Ref Range Status   POC Amphetamine UR 08/26/2024 None Detected  NONE DETECTED (Cut Off Level 1000 ng/mL) Final   POC Secobarbital (BAR) 08/26/2024 None Detected  NONE DETECTED (Cut Off Level 300 ng/mL) Final   POC Buprenorphine (BUP) 08/26/2024 None Detected  NONE DETECTED (Cut Off Level 10 ng/mL) Final   POC Oxazepam (BZO) 08/26/2024 Positive (A)  NONE DETECTED (Cut Off Level 300 ng/mL) Final   POC Cocaine  UR 08/26/2024 Positive (A)  NONE DETECTED (Cut Off Level 300 ng/mL) Final   POC Methamphetamine UR 08/26/2024 None Detected  NONE DETECTED (Cut Off Level 1000 ng/mL) Final   POC Morphine  08/26/2024 Positive (A)  NONE DETECTED (Cut Off Level 300 ng/mL) Final   POC Methadone UR 08/26/2024 Positive (A)  NONE DETECTED (Cut Off Level 300 ng/mL) Final   POC Oxycodone  UR 08/26/2024 None Detected  NONE DETECTED (Cut Off Level 100 ng/mL) Final   POC Marijuana UR 08/26/2024 Positive (A)  NONE DETECTED (Cut Off Level 50 ng/mL) Final   Sodium 08/26/2024 139  135 - 145 mmol/L  Final   Potassium 08/26/2024 4.5  3.5 - 5.1 mmol/L Final   Chloride 08/26/2024 102  98 - 111 mmol/L Final   CO2 08/26/2024 27  22 - 32 mmol/L Final   Glucose, Bld 08/26/2024 84  70 - 99 mg/dL Final   Glucose reference range applies only to samples taken after fasting for at least 8 hours.   BUN 08/26/2024 6  6 - 20 mg/dL Final   Creatinine, Ser 08/26/2024 0.63  0.61 - 1.24 mg/dL Final   Calcium 88/75/7974 9.3  8.9 - 10.3 mg/dL Final   Total Protein 88/75/7974 7.8  6.5 - 8.1 g/dL Final   Albumin  08/26/2024 3.6  3.5 - 5.0 g/dL Final   AST 88/75/7974 132 (H)  15 - 41 U/L Final   ALT 08/26/2024 120 (H)  0 - 44 U/L Final   Alkaline Phosphatase 08/26/2024 138 (H)  38 - 126 U/L Final  Total Bilirubin 08/26/2024 0.7  0.0 - 1.2 mg/dL Final   GFR, Estimated 08/26/2024 >60  >60 mL/min Final   Comment: (NOTE) Calculated using the CKD-EPI Creatinine Equation (2021)    Anion gap 08/26/2024 10  5 - 15 Final   Performed at [facility], [address] [place], [place] [address]   WBC 08/26/2024 5.9  4.0 - 10.5 K/uL Final   RBC 08/26/2024 5.14  4.22 - 5.81 MIL/uL Final   Hemoglobin 08/26/2024 13.3  13.0 - 17.0 g/dL Final   HCT 88/75/7974 41.9  39.0 - 52.0 % Final   MCV 08/26/2024 81.5  80.0 - 100.0 fL Final   MCH 08/26/2024 25.9 (L)  26.0 - 34.0 pg Final   MCHC 08/26/2024 31.7  30.0 - 36.0 g/dL Final   RDW 88/75/7974 18.6 (H)  11.5 - 15.5 % Final   Platelets 08/26/2024 233  150 - 400 K/uL Final   nRBC 08/26/2024 0.0  0.0 - 0.2 % Final   Neutrophils Relative % 08/26/2024 64  % Final   Neutro Abs 08/26/2024 3.8  1.7 - 7.7 K/uL Final   Lymphocytes Relative 08/26/2024 23  % Final   Lymphs Abs 08/26/2024 1.4  0.7 - 4.0 K/uL Final   Monocytes Relative 08/26/2024 11  % Final   Monocytes Absolute 08/26/2024 0.7  0.1 - 1.0 K/uL Final   Eosinophils Relative 08/26/2024 1  % Final   Eosinophils Absolute 08/26/2024 0.0  0.0 - 0.5 K/uL Final   Basophils Relative 08/26/2024 1  % Final   Basophils  Absolute 08/26/2024 0.1  0.0 - 0.1 K/uL Final   Immature Granulocytes 08/26/2024 0  % Final   Abs Immature Granulocytes 08/26/2024 0.02  0.00 - 0.07 K/uL Final   Performed at [facility], [address] [place], [place] [address]   Alcohol, Ethyl (B) 08/26/2024 <15  <15 mg/dL Final   Comment: (NOTE) For medical purposes only. Performed at [facility], [address] [place], [place] [address]   Admission on 08/08/2024, Discharged on 08/08/2024  Component Date Value Ref Range Status   Sodium 08/08/2024 138  135 - 145 mmol/L Final   Potassium 08/08/2024 4.0  3.5 - 5.1 mmol/L Final   Chloride 08/08/2024 99  98 - 111 mmol/L Final   CO2 08/08/2024 24  22 - 32 mmol/L Final   Glucose, Bld 08/08/2024 67 (L)  70 - 99 mg/dL Final   Glucose reference range applies only to samples taken after fasting for at least 8 hours.   BUN 08/08/2024 10  6 - 20 mg/dL Final   Creatinine, Ser 08/08/2024 0.57 (L)  0.61 - 1.24 mg/dL Final   Calcium 88/93/7974 9.0  8.9 - 10.3 mg/dL Final   Total Protein 88/93/7974 8.2 (H)  6.5 - 8.1 g/dL Final   Albumin  08/08/2024 4.1  3.5 - 5.0 g/dL Final   AST 88/93/7974 188 (H)  15 - 41 U/L Final   ALT 08/08/2024 107 (H)  0 - 44 U/L Final   Alkaline Phosphatase 08/08/2024 160 (H)  38 - 126 U/L Final   Total Bilirubin 08/08/2024 0.5  0.0 - 1.2 mg/dL Final   GFR, Estimated 08/08/2024 >60  >60 mL/min Final   Comment: (NOTE) Calculated using the CKD-EPI Creatinine Equation (2021)    Anion gap 08/08/2024 15  5 - 15 Final   Performed at [facility], [address] [place], [place] [address]   Alcohol, Ethyl (B) 08/08/2024 35 (H)  <  15 mg/dL Final   Comment: (NOTE) For medical purposes only. Performed at [facility], [address], [place], [place] [address]    WBC 08/08/2024 4.6  4.0 - 10.5 K/uL Final   RBC 08/08/2024 5.08  4.22 - 5.81 MIL/uL Final   Hemoglobin 08/08/2024 13.0  13.0 - 17.0 g/dL Final   HCT 88/93/7974  41.8  39.0 - 52.0 % Final   MCV 08/08/2024 82.3  80.0 - 100.0 fL Final   MCH 08/08/2024 25.6 (L)  26.0 - 34.0 pg Final   MCHC 08/08/2024 31.1  30.0 - 36.0 g/dL Final   RDW 88/93/7974 18.8 (H)  11.5 - 15.5 % Final   Platelets 08/08/2024 253  150 - 400 K/uL Final   nRBC 08/08/2024 0.0  0.0 - 0.2 % Final   Performed at [facility], [address], [place], [place] [address]  Admission on 07/13/2024, Discharged on 07/13/2024  Component Date Value Ref Range Status   Sodium 07/13/2024 135  135 - 145 mmol/L Final   Potassium 07/13/2024 3.6  3.5 - 5.1 mmol/L Final   Chloride 07/13/2024 98  98 - 111 mmol/L Final   CO2 07/13/2024 25  22 - 32 mmol/L Final   Glucose, Bld 07/13/2024 105 (H)  70 - 99 mg/dL Final   Glucose reference range applies only to samples taken after fasting for at least 8 hours.   BUN 07/13/2024 <5 (L)  6 - 20 mg/dL Final   Creatinine, Ser 07/13/2024 0.50 (L)  0.61 - 1.24 mg/dL Final   Calcium 89/88/7974 8.6 (L)  8.9 - 10.3 mg/dL Final   Total Protein 89/88/7974 7.7  6.5 - 8.1 g/dL Final   Albumin  07/13/2024 3.3 (L)  3.5 - 5.0 g/dL Final   AST 89/88/7974 87 (H)  15 - 41 U/L Final   ALT 07/13/2024 74 (H)  0 - 44 U/L Final   Alkaline Phosphatase 07/13/2024 150 (H)  38 - 126 U/L Final   Total Bilirubin 07/13/2024 0.6  0.0 - 1.2 mg/dL Final   GFR, Estimated 07/13/2024 >60  >60 mL/min Final   Comment: (NOTE) Calculated using the CKD-EPI Creatinine Equation (2021)    Anion gap 07/13/2024 12  5 - 15 Final   Performed at [facility], [address] [place], [place] [address]   Alcohol, Ethyl (B) 07/13/2024 <15  <15 mg/dL Final   Comment: (NOTE) For medical purposes only. Performed at [facility], [address], [place], [place] [address]    WBC 07/13/2024 5.2  4.0 - 10.5 K/uL Final   RBC 07/13/2024 4.92  4.22 - 5.81 MIL/uL Final   Hemoglobin 07/13/2024 12.6 (L)  13.0 - 17.0 g/dL Final   HCT 89/88/7974 39.7  39.0 - 52.0 % Final   MCV  07/13/2024 80.7  80.0 - 100.0 fL Final   MCH 07/13/2024 25.6 (L)  26.0 - 34.0 pg Final   MCHC 07/13/2024 31.7  30.0 - 36.0 g/dL Final   RDW 89/88/7974 18.4 (H)  11.5 - 15.5 % Final   Platelets 07/13/2024 269  150 - 400 K/uL Final   nRBC 07/13/2024 0.0  0.0 - 0.2 % Final   Performed at [facility], [address] [place], [place] [address]   Salicylate Lvl 07/13/2024 <7.0 (L)  7.0 - 30.0 mg/dL Final   Performed at [facility], [address], [place], [place] [address]   Acetaminophen  (Tylenol ), Serum 07/13/2024 <10 (L)  10 -  30 ug/mL Final   Comment: (NOTE) Therapeutic concentrations vary significantly. A range of 10-30 ug/mL  may be an effective concentration for many patients. However, some  are best treated at concentrations outside of this range. Acetaminophen  concentrations >150 ug/mL at 4 hours after ingestion  and >50 ug/mL at 12 hours after ingestion are often associated with  toxic reactions.  Performed at [facility], [address], [place], [place] [address]   Admission on 06/02/2024, Discharged on 06/02/2024  Component Date Value Ref Range Status   Sodium 06/02/2024 134 (L)  135 - 145 mmol/L Final   Potassium 06/02/2024 3.4 (L)  3.5 - 5.1 mmol/L Final   Chloride 06/02/2024 96 (L)  98 - 111 mmol/L Final   CO2 06/02/2024 24  22 - 32 mmol/L Final   Glucose, Bld 06/02/2024 123 (H)  70 - 99 mg/dL Final   Glucose reference range applies only to samples taken after fasting for at least 8 hours.   BUN 06/02/2024 <5 (L)  6 - 20 mg/dL Final   Creatinine, Ser 06/02/2024 0.63  0.61 - 1.24 mg/dL Final   Calcium 91/68/7974 9.0  8.9 - 10.3 mg/dL Final   Total Protein 91/68/7974 8.6 (H)  6.5 - 8.1 g/dL Final   Albumin  06/02/2024 3.7  3.5 - 5.0 g/dL Final   AST 91/68/7974 155 (H)  15 - 41 U/L Final   ALT 06/02/2024 86 (H)  0 - 44 U/L Final   Alkaline Phosphatase 06/02/2024 111  38 - 126 U/L Final   Total Bilirubin 06/02/2024 1.1  0.0 - 1.2 mg/dL Final   GFR,  Estimated 06/02/2024 >60  >60 mL/min Final   Comment: (NOTE) Calculated using the CKD-EPI Creatinine Equation (2021)    Anion gap 06/02/2024 14  5 - 15 Final   Performed at [facility], [address], [place], [place] [address]   Alcohol, Ethyl (B) 06/02/2024 <15  <15 mg/dL Final   Comment: (NOTE) For medical purposes only. Performed at [facility], [address] [place], [place] [address]    WBC 06/02/2024 8.7  4.0 - 10.5 K/uL Final   RBC 06/02/2024 4.87  4.22 - 5.81 MIL/uL Final   Hemoglobin 06/02/2024 12.2 (L)  13.0 - 17.0 g/dL Final   HCT 91/68/7974 39.3  39.0 - 52.0 % Final   MCV 06/02/2024 80.7  80.0 - 100.0 fL Final   MCH 06/02/2024 25.1 (L)  26.0 - 34.0 pg Final   MCHC 06/02/2024 31.0  30.0 - 36.0 g/dL Final   RDW 91/68/7974 14.9  11.5 - 15.5 % Final   Platelets 06/02/2024 284  150 - 400 K/uL Final   nRBC 06/02/2024 0.0  0.0 - 0.2 % Final   Performed at [facility], [address] [place], [place] [address]   Opiates 06/02/2024 NONE DETECTED  NONE DETECTED Final   Cocaine  06/02/2024 POSITIVE (A)  NONE DETECTED Final   Benzodiazepines 06/02/2024 POSITIVE (A)  NONE DETECTED Final   Amphetamines 06/02/2024 POSITIVE (A)  NONE DETECTED Final   Comment: (NOTE) Trazodone  is metabolized in vivo to several metabolites, including pharmacologically active m-CPP, which is excreted in the urine. Immunoassay screens for amphetamines and MDMA have potential cross-reactivity with these compounds and may provide false positive  results.     Tetrahydrocannabinol 06/02/2024 POSITIVE (A)  NONE DETECTED Final   Barbiturates 06/02/2024 NONE DETECTED  NONE DETECTED Final   Comment: (NOTE) DRUG SCREEN FOR MEDICAL PURPOSES ONLY.  IF CONFIRMATION  IS NEEDED FOR ANY PURPOSE, NOTIFY LAB WITHIN 5 DAYS.  LOWEST DETECTABLE LIMITS FOR URINE DRUG SCREEN Drug Class                     Cutoff (ng/mL) Amphetamine and metabolites    1000 Barbiturate and metabolites     200 Benzodiazepine                 200 Opiates and metabolites        300 Cocaine  and metabolites        300 THC                            50 Performed at [facility], [address], [place], [place] [address]   Admission on 05/23/2024, Discharged on 05/23/2024  Component Date Value Ref Range Status   Sodium 05/23/2024 137  135 - 145 mmol/L Final   Potassium 05/23/2024 3.3 (L)  3.5 - 5.1 mmol/L Final   Chloride 05/23/2024 101  98 - 111 mmol/L Final   CO2 05/23/2024 27  22 - 32 mmol/L Final   Glucose, Bld 05/23/2024 105 (H)  70 - 99 mg/dL Final   Glucose reference range applies only to samples taken after fasting for at least 8 hours.   BUN 05/23/2024 12  6 - 20 mg/dL Final   Creatinine, Ser 05/23/2024 0.57 (L)  0.61 - 1.24 mg/dL Final   Calcium 91/78/7974 8.5 (L)  8.9 - 10.3 mg/dL Final   Total Protein 91/78/7974 7.6  6.5 - 8.1 g/dL Final   Albumin  05/23/2024 3.4 (L)  3.5 - 5.0 g/dL Final   AST 91/78/7974 63 (H)  15 - 41 U/L Final   ALT 05/23/2024 38  0 - 44 U/L Final   Alkaline Phosphatase 05/23/2024 104  38 - 126 U/L Final   Total Bilirubin 05/23/2024 0.4  0.0 - 1.2 mg/dL Final   GFR, Estimated 05/23/2024 >60  >60 mL/min Final   Comment: (NOTE) Calculated using the CKD-EPI Creatinine Equation (2021)    Anion gap 05/23/2024 9  5 - 15 Final   Performed at [facility], [address] [place], [place] [address]   Alcohol, Ethyl (B) 05/23/2024 <15  <15 mg/dL Final   Comment: (NOTE) For medical purposes only. Performed at [facility], [address], [place], [place] [address]    WBC 05/23/2024 6.0  4.0 - 10.5 K/uL Final   RBC 05/23/2024 4.14 (L)  4.22 - 5.81 MIL/uL Final   Hemoglobin 05/23/2024 10.6 (L)  13.0 - 17.0 g/dL Final   HCT 91/78/7974 34.5 (L)  39.0 - 52.0 % Final   MCV 05/23/2024 83.3  80.0 - 100.0 fL Final   MCH 05/23/2024 25.6 (L)  26.0 - 34.0 pg Final   MCHC 05/23/2024 30.7  30.0 - 36.0 g/dL Final   RDW 91/78/7974  14.7  11.5 - 15.5 % Final   Platelets 05/23/2024 254  150 - 400 K/uL Final   nRBC 05/23/2024 0.0  0.0 - 0.2 % Final   Performed at [facility], [address] [place], [place] [address]  Admission on 05/10/2024, Discharged on 05/12/2024  Component Date Value Ref Range Status   Hgb A1c MFr Bld 05/11/2024 5.6  4.8 - 5.6 % Final   Comment: (NOTE)         Prediabetes: 5.7 - 6.4         Diabetes: >  6.4         Glycemic control for adults with diabetes: <7.0    Mean Plasma Glucose 05/11/2024 114  mg/dL Final   Comment: (NOTE) Performed At: [facility] [address] [place], [place] [id number] Jennette Shorter MD Ey:[id number]    Prolactin 05/11/2024 20.5  3.9 - 22.7 ng/mL Final   Comment: (NOTE) Performed At: [facility] [address] [place], [place] [id number] Jennette Shorter MD Ey:[id number]    RPR Ser Ql 05/11/2024 NON REACTIVE  NON REACTIVE Final   Performed at [facility], [address] [place], [place] [address]   Hepatitis B Surface Ag 05/11/2024 NON REACTIVE  NON REACTIVE Final   HCV Ab 05/11/2024 Reactive (A)  NON REACTIVE Final   Comment: (NOTE) The CDC recommends that a Reactive HCV antibody result be followed up  with a HCV Nucleic Acid Amplification test.     Hep A IgM 05/11/2024 NON REACTIVE  NON REACTIVE Final   Hep B C IgM 05/11/2024 NON REACTIVE  NON REACTIVE Final   Performed at [facility], [address], [place], [place] [address]   HIV Screen 4th Generation wRfx 05/11/2024 Non Reactive  Non Reactive Final   Performed at [facility], [address] [place], [place] [address]   TSH 05/11/2024 0.752  0.350 - 4.500 uIU/mL Final   Comment: Performed by a 3rd Generation assay with a functional sensitivity of <=0.01 uIU/mL. Performed at [facility], [address], [place], [place] [address]   Admission on 05/10/2024, Discharged on 05/10/2024  Component Date Value Ref Range Status   WBC  05/10/2024 8.3  4.0 - 10.5 K/uL Final   RBC 05/10/2024 4.14 (L)  4.22 - 5.81 MIL/uL Final   Hemoglobin 05/10/2024 10.9 (L)  13.0 - 17.0 g/dL Final   HCT 91/91/7974 33.7 (L)  39.0 - 52.0 % Final   MCV 05/10/2024 81.4  80.0 - 100.0 fL Final   MCH 05/10/2024 26.3  26.0 - 34.0 pg Final   MCHC 05/10/2024 32.3  30.0 - 36.0 g/dL Final   RDW 91/91/7974 14.1  11.5 - 15.5 % Final   Platelets 05/10/2024 302  150 - 400 K/uL Final   nRBC 05/10/2024 0.0  0.0 - 0.2 % Final   Neutrophils Relative % 05/10/2024 51  % Final   Neutro Abs 05/10/2024 4.2  1.7 - 7.7 K/uL Final   Lymphocytes Relative 05/10/2024 37  % Final   Lymphs Abs 05/10/2024 3.1  0.7 - 4.0 K/uL Final   Monocytes Relative 05/10/2024 10  % Final   Monocytes Absolute 05/10/2024 0.8  0.1 - 1.0 K/uL Final   Eosinophils Relative 05/10/2024 1  % Final   Eosinophils Absolute 05/10/2024 0.1  0.0 - 0.5 K/uL Final   Basophils Relative 05/10/2024 1  % Final   Basophils Absolute 05/10/2024 0.1  0.0 - 0.1 K/uL Final   Immature Granulocytes 05/10/2024 0  % Final   Abs Immature Granulocytes 05/10/2024 0.03  0.00 - 0.07 K/uL Final   Performed at [facility], [address], [place], [place] [address]   Sodium 05/10/2024 138  135 - 145 mmol/L Final   Potassium 05/10/2024 3.9  3.5 - 5.1 mmol/L Final   Chloride 05/10/2024 101  98 - 111 mmol/L Final   CO2 05/10/2024 24  22 - 32 mmol/L Final   Glucose, Bld 05/10/2024 83  70 - 99 mg/dL Final   Glucose reference range applies only to  samples taken after fasting for at least 8 hours.   BUN 05/10/2024 <5 (L)  6 - 20 mg/dL Final   Creatinine, Ser 05/10/2024 0.57 (L)  0.61 - 1.24 mg/dL Final   Calcium 91/91/7974 8.8 (L)  8.9 - 10.3 mg/dL Final   Total Protein 91/91/7974 7.7  6.5 - 8.1 g/dL Final   Albumin  05/10/2024 3.5  3.5 - 5.0 g/dL Final   AST 91/91/7974 87 (H)  15 - 41 U/L Final   ALT 05/10/2024 80 (H)  0 - 44 U/L Final   Alkaline Phosphatase 05/10/2024 121  38 - 126 U/L Final   Total Bilirubin  05/10/2024 0.4  0.0 - 1.2 mg/dL Final   GFR, Estimated 05/10/2024 >60  >60 mL/min Final   Comment: (NOTE) Calculated using the CKD-EPI Creatinine Equation (2021)    Anion gap 05/10/2024 13  5 - 15 Final   Performed at [facility], [address] [place], [place] [address]   Alcohol, Ethyl (B) 05/10/2024 126 (H)  <15 mg/dL Final   Comment: (NOTE) For medical purposes only. Performed at [facility], [address], [place], [place] [address]    Cholesterol 05/10/2024 125  0 - 200 mg/dL Final   Triglycerides 91/91/7974 49  <150 mg/dL Final   HDL 91/91/7974 48  >40 mg/dL Final   Total CHOL/HDL Ratio 05/10/2024 2.6  RATIO Final   VLDL 05/10/2024 10  0 - 40 mg/dL Final   LDL Cholesterol 05/10/2024 67  0 - 99 mg/dL Final   Comment:        Total Cholesterol/HDL:CHD Risk Coronary Heart Disease Risk Table                     Men   Women  1/2 Average Risk   3.4   3.3  Average Risk       5.0   4.4  2 X Average Risk   9.6   7.1  3 X Average Risk  23.4   11.0        Use the calculated Patient Ratio above and the CHD Risk Table to determine the patient's CHD Risk.        ATP III CLASSIFICATION (LDL):  <100     mg/dL   Optimal  899-870  mg/dL   Near or Above                    Optimal  130-159  mg/dL   Borderline  839-810  mg/dL   High  >809     mg/dL   Very High Performed at [facility], [address] [place], [place] [address]    POC Amphetamine UR 05/10/2024 None Detected  NONE DETECTED (Cut Off Level 1000 ng/mL) Final   POC Secobarbital (BAR) 05/10/2024 None Detected  NONE DETECTED (Cut Off Level 300 ng/mL) Final   POC Buprenorphine (BUP) 05/10/2024 None Detected  NONE DETECTED (Cut Off Level 10 ng/mL) Final   POC Oxazepam (BZO) 05/10/2024 None Detected  NONE DETECTED (Cut Off Level 300 ng/mL) Final   POC Cocaine  UR 05/10/2024 Positive (A)  NONE DETECTED (Cut Off Level 300 ng/mL) Final   POC Methamphetamine UR 05/10/2024 Positive (A)  NONE DETECTED (Cut Off  Level 1000 ng/mL) Final   POC Morphine  05/10/2024 None Detected  NONE DETECTED (Cut Off Level 300 ng/mL) Final   POC Methadone UR 05/10/2024 None Detected  NONE DETECTED (Cut Off Level 300 ng/mL) Final   POC  Oxycodone  UR 05/10/2024 None Detected  NONE DETECTED (Cut Off Level 100 ng/mL) Final   POC Marijuana UR 05/10/2024 None Detected  NONE DETECTED (Cut Off Level 50 ng/mL) Final  Admission on 05/09/2024, Discharged on 05/09/2024  Component Date Value Ref Range Status   Glucose-Capillary 05/09/2024 122 (H)  70 - 99 mg/dL Final   Glucose reference range applies only to samples taken after fasting for at least 8 hours.  Admission on 04/27/2024, Discharged on 04/28/2024  Component Date Value Ref Range Status   WBC 04/27/2024 8.4  4.0 - 10.5 K/uL Final   RBC 04/27/2024 3.91 (L)  4.22 - 5.81 MIL/uL Final   Hemoglobin 04/27/2024 10.3 (L)  13.0 - 17.0 g/dL Final   HCT 92/73/7974 32.3 (L)  39.0 - 52.0 % Final   MCV 04/27/2024 82.6  80.0 - 100.0 fL Final   MCH 04/27/2024 26.3  26.0 - 34.0 pg Final   MCHC 04/27/2024 31.9  30.0 - 36.0 g/dL Final   RDW 92/73/7974 13.5  11.5 - 15.5 % Final   Platelets 04/27/2024 299  150 - 400 K/uL Final   nRBC 04/27/2024 0.0  0.0 - 0.2 % Final   Neutrophils Relative % 04/27/2024 63  % Final   Neutro Abs 04/27/2024 5.4  1.7 - 7.7 K/uL Final   Lymphocytes Relative 04/27/2024 22  % Final   Lymphs Abs 04/27/2024 1.8  0.7 - 4.0 K/uL Final   Monocytes Relative 04/27/2024 13  % Final   Monocytes Absolute 04/27/2024 1.1 (H)  0.1 - 1.0 K/uL Final   Eosinophils Relative 04/27/2024 0  % Final   Eosinophils Absolute 04/27/2024 0.0  0.0 - 0.5 K/uL Final   Basophils Relative 04/27/2024 1  % Final   Basophils Absolute 04/27/2024 0.1  0.0 - 0.1 K/uL Final   Immature Granulocytes 04/27/2024 1  % Final   Abs Immature Granulocytes 04/27/2024 0.04  0.00 - 0.07 K/uL Final   Performed at [facility], [address] [place], [place] [address]   Sodium 04/27/2024  132 (L)  135 - 145 mmol/L Final   Potassium 04/27/2024 3.6  3.5 - 5.1 mmol/L Final   Chloride 04/27/2024 98  98 - 111 mmol/L Final   CO2 04/27/2024 25  22 - 32 mmol/L Final   Glucose, Bld 04/27/2024 128 (H)  70 - 99 mg/dL Final   Glucose reference range applies only to samples taken after fasting for at least 8 hours.   BUN 04/27/2024 13  6 - 20 mg/dL Final   Creatinine, Ser 04/27/2024 0.52 (L)  0.61 - 1.24 mg/dL Final   Calcium 92/73/7974 8.9  8.9 - 10.3 mg/dL Final   GFR, Estimated 04/27/2024 >60  >60 mL/min Final   Comment: (NOTE) Calculated using the CKD-EPI Creatinine Equation (2021)    Anion gap 04/27/2024 9  5 - 15 Final   Performed at [facility], [address], [place], [place] [address]   Alcohol, Ethyl (B) 04/27/2024 <15  <15 mg/dL Final   Comment: (NOTE) For medical purposes only. Performed at [facility], [address], [place], [place] [address]    Opiates 04/27/2024 POSITIVE (A)  NONE DETECTED Final   Cocaine  04/27/2024 POSITIVE (A)  NONE DETECTED Final   Benzodiazepines 04/27/2024 NONE DETECTED  NONE DETECTED Final   Amphetamines 04/27/2024 NONE DETECTED  NONE DETECTED Final   Tetrahydrocannabinol 04/27/2024 POSITIVE (A)  NONE DETECTED Final   Barbiturates 04/27/2024 NONE DETECTED  NONE DETECTED Final   Comment: (  NOTE) DRUG SCREEN FOR MEDICAL PURPOSES ONLY.  IF CONFIRMATION IS NEEDED FOR ANY PURPOSE, NOTIFY LAB WITHIN 5 DAYS.  LOWEST DETECTABLE LIMITS FOR URINE DRUG SCREEN Drug Class                     Cutoff (ng/mL) Amphetamine and metabolites    1000 Barbiturate and metabolites    200 Benzodiazepine                 200 Opiates and metabolites        300 Cocaine  and metabolites        300 THC                            50 Performed at [facility], [address] [place], [place] [address]   Admission on 04/09/2024, Discharged on 04/10/2024  Component Date Value Ref Range Status   Sodium  04/09/2024 136  135 - 145 mmol/L Final   Potassium 04/09/2024 3.2 (L)  3.5 - 5.1 mmol/L Final   Chloride 04/09/2024 100  98 - 111 mmol/L Final   CO2 04/09/2024 22  22 - 32 mmol/L Final   Glucose, Bld 04/09/2024 94  70 - 99 mg/dL Final   Glucose reference range applies only to samples taken after fasting for at least 8 hours.   BUN 04/09/2024 <5 (L)  6 - 20 mg/dL Final   Creatinine, Ser 04/09/2024 0.59 (L)  0.61 - 1.24 mg/dL Final   Calcium 92/91/7974 8.7 (L)  8.9 - 10.3 mg/dL Final   Total Protein 92/91/7974 8.5 (H)  6.5 - 8.1 g/dL Final   Albumin  04/09/2024 4.0  3.5 - 5.0 g/dL Final   AST 92/91/7974 78 (H)  15 - 41 U/L Final   ALT 04/09/2024 33  0 - 44 U/L Final   Alkaline Phosphatase 04/09/2024 120  38 - 126 U/L Final   Total Bilirubin 04/09/2024 0.7  0.0 - 1.2 mg/dL Final   GFR, Estimated 04/09/2024 >60  >60 mL/min Final   Comment: (NOTE) Calculated using the CKD-EPI Creatinine Equation (2021)    Anion gap 04/09/2024 14  5 - 15 Final   Performed at [facility], [address] [place], [place] [address]   Alcohol, Ethyl (B) 04/09/2024 125 (H)  <15 mg/dL Final   Comment: (NOTE) For medical purposes only. Performed at [facility], [address], [place], [place] [address]    Opiates 04/09/2024 NONE DETECTED  NONE DETECTED Final   Cocaine  04/09/2024 POSITIVE (A)  NONE DETECTED Final   Benzodiazepines 04/09/2024 POSITIVE (A)  NONE DETECTED Final   Amphetamines 04/09/2024 POSITIVE (A)  NONE DETECTED Final   Comment: (NOTE) Trazodone  is metabolized in vivo to several metabolites, including pharmacologically active m-CPP, which is excreted in the urine. Immunoassay screens for amphetamines and MDMA have potential cross-reactivity with these compounds and may provide false positive  results.     Tetrahydrocannabinol 04/09/2024 NONE DETECTED  NONE DETECTED Final   Barbiturates 04/09/2024 NONE DETECTED  NONE DETECTED Final   Comment:  (NOTE) DRUG SCREEN FOR MEDICAL PURPOSES ONLY.  IF CONFIRMATION IS NEEDED FOR ANY PURPOSE, NOTIFY LAB WITHIN 5 DAYS.  LOWEST DETECTABLE LIMITS FOR URINE DRUG SCREEN Drug Class                     Cutoff (ng/mL) Amphetamine and metabolites    1000 Barbiturate and metabolites  200 Benzodiazepine                 200 Opiates and metabolites        300 Cocaine  and metabolites        300 THC                            50 Performed at [facility], [address], [place], [place] [address]    WBC 04/09/2024 8.1  4.0 - 10.5 K/uL Final   RBC 04/09/2024 4.07 (L)  4.22 - 5.81 MIL/uL Final   Hemoglobin 04/09/2024 11.2 (L)  13.0 - 17.0 g/dL Final   HCT 92/91/7974 34.8 (L)  39.0 - 52.0 % Final   MCV 04/09/2024 85.5  80.0 - 100.0 fL Final   MCH 04/09/2024 27.5  26.0 - 34.0 pg Final   MCHC 04/09/2024 32.2  30.0 - 36.0 g/dL Final   RDW 92/91/7974 13.2  11.5 - 15.5 % Final   Platelets 04/09/2024 411 (H)  150 - 400 K/uL Final   nRBC 04/09/2024 0.0  0.0 - 0.2 % Final   Neutrophils Relative % 04/09/2024 60  % Final   Neutro Abs 04/09/2024 4.9  1.7 - 7.7 K/uL Final   Lymphocytes Relative 04/09/2024 30  % Final   Lymphs Abs 04/09/2024 2.4  0.7 - 4.0 K/uL Final   Monocytes Relative 04/09/2024 7  % Final   Monocytes Absolute 04/09/2024 0.6  0.1 - 1.0 K/uL Final   Eosinophils Relative 04/09/2024 1  % Final   Eosinophils Absolute 04/09/2024 0.1  0.0 - 0.5 K/uL Final   Basophils Relative 04/09/2024 2  % Final   Basophils Absolute 04/09/2024 0.1  0.0 - 0.1 K/uL Final   Immature Granulocytes 04/09/2024 0  % Final   Abs Immature Granulocytes 04/09/2024 0.02  0.00 - 0.07 K/uL Final   Performed at [facility], [address], [place], [place] [address]  There may be more visits with results that are not included.    Allergies: Codeine, Fish allergy, and Shellfish allergy  Medications:  Facility Ordered Medications  Medication   QUEtiapine  (SEROQUEL ) tablet 50  mg   ibuprofen  (ADVIL ) tablet 600 mg   [COMPLETED] loperamide  (IMODIUM ) capsule 4 mg   acetaminophen  (TYLENOL ) tablet 650 mg   [COMPLETED] dicyclomine  (BENTYL ) capsule 10 mg   acetaminophen  (TYLENOL ) tablet 650 mg   alum & mag hydroxide-simeth (MAALOX/MYLANTA) 200-200-20 MG/5ML suspension 30 mL   magnesium  hydroxide (MILK OF MAGNESIA) suspension 30 mL   haloperidol  (HALDOL ) tablet 5 mg   And   diphenhydrAMINE  (BENADRYL ) capsule 50 mg   haloperidol  lactate (HALDOL ) injection 5 mg   And   diphenhydrAMINE  (BENADRYL ) injection 50 mg   And   LORazepam  (ATIVAN ) injection 2 mg   haloperidol  lactate (HALDOL ) injection 10 mg   And   diphenhydrAMINE  (BENADRYL ) injection 50 mg   And   LORazepam  (ATIVAN ) injection 2 mg   hydrOXYzine  (ATARAX ) tablet 25 mg   [START ON 08/27/2024] nicotine  (NICODERM CQ  - dosed in mg/24 hours) patch 21 mg   dextromethorphan -guaiFENesin  (MUCINEX  DM) 30-600 MG per 12 hr tablet 1 tablet   LORazepam  (ATIVAN ) tablet 1 mg   multivitamin with minerals tablet 1 tablet   ondansetron  (ZOFRAN -ODT) disintegrating tablet 4 mg   loperamide  (IMODIUM ) capsule 2-4 mg   PTA Medications  Medication Sig   QUEtiapine  (SEROQUEL ) 50 MG tablet Take 50 mg by mouth at bedtime. (Patient taking  differently: Take 50 mg by mouth at bedtime as needed (Sleep).)   gabapentin  (NEURONTIN ) 300 MG capsule Take 300 mg by mouth 3 (three) times daily.   hydrOXYzine  (ATARAX ) 25 MG tablet Take 25 mg by mouth every 6 (six) hours as needed for anxiety. (Patient not taking: Reported on 05/23/2024)   naproxen  (NAPROSYN ) 375 MG tablet Take 1 tablet (375 mg total) by mouth 2 (two) times daily. (Patient not taking: Reported on 05/23/2024)   sertraline  (ZOLOFT ) 50 MG tablet Take 50 mg by mouth daily. (Patient not taking: Reported on 08/26/2024)    Long Term Goals: Improvement in symptoms so as ready for discharge  Short Term Goals: Patient will verbalize feelings in meetings with treatment team members.,  Patient will attend at least of 50% of the groups daily., Pt will complete the PHQ9 on admission, day 3 and discharge., Patient will participate in completing the [place] Suicide Severity Rating Scale, Patient will score a low risk of violence for 24 hours prior to discharge, and Patient will take medications as prescribed daily.  Medical Decision Making   Status: Voluntary  Alcohol use disorder, opioid use disorder, stimulant use disorder, currently in withdrawal; history schizoaffective disorder - CIWA with as needed Ativan  - Comfort as needed medication - Can consider Ativan  taper should this be necessary - Seroquel  50 mg twice daily targeting auditory hallucinations, anxiety, and insomnia - Residential rehab placement   Labs and EKG reviewed, unremarkable.  LFT elevation, likely hep C versus alcoholic hepatitis Patient reports productive cough and right-sided chest pain.  Afebrile, no leukocytosis.  Will continue to monitor.   Recommendations  I certify that this patient does not appear to have an emergency medical condition.  Karleen Kaufmann, MD PGY-4

## 2024-08-26 NOTE — ED Notes (Signed)
Patient sleeping with no s/s of distress.

## 2024-08-26 NOTE — Group Note (Signed)
 Group Topic: Communication  Group Date: 08/26/2024 Start Time: 1930 End Time: 2000 Facilitators: Verdon Jacqualyn BRAVO, NT  Department: [facility]  Number of Participants: 10  Group Focus: communication Treatment Modality:  Individual Therapy Interventions utilized were clarification Purpose: express feelings  Name: Mark Hammond Date of Birth: [birth year]  MR: [id number]    Level of Participation: n/a Quality of Participation: n/a Interactions with others: n/a Mood/Affect: appropriate Triggers (if applicable): n/a Cognition: n/a Progress: None Response: n/a Plan: patient will be encouraged to attend group  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   Polysubstance dependence (HCC) 12/13/2023   Polysubstance abuse (HCC) 11/21/2023   Alcohol use disorder 08/04/2023   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

## 2024-08-26 NOTE — Progress Notes (Signed)
   08/26/24 0803  [facility] Triage Screening (Walk-ins at [facility] only)  How Did You Hear About Us ? Self  What Is the Reason for Your Visit/Call Today? Boot presents back to [facility] unaccompanied. Pt states he attempted to call 911 just now on the [facility] phone because he is having pain and withdrawls. Pt states he wanted to go to the ED, but had claimed that the disbatch told him to check in here at the [facility] so he could get sent out to the ED. Pt states he is homeless and and needing a place to stay. Pt has been to the ED 28 times due to homelessness. Pt reports that he is wanting to go to the ED at this time of triage. Pt states that he used Cocaine , Weed and Pain pills in the past 24 hours (roughly a gram each). Pt also states he drank 40oz of alcohol yesterday. Pt denies Si, Hi and AVH.  How Long Has This Been Causing You Problems? > than 6 months  Have You Recently Had Any Thoughts About Hurting Yourself? No  Are You Planning to Commit Suicide/Harm Yourself At This time? No  Have you Recently Had Thoughts About Hurting Someone [name]? No  Are You Planning To Harm Someone At This Time? No  Physical Abuse Denies  Verbal Abuse Denies  Sexual Abuse Denies  Exploitation of patient/patient's resources Denies  Self-Neglect Denies  Possible abuse reported to: Other (Comment)  Are you currently experiencing any auditory, visual or other hallucinations? No  Have You Used Any Alcohol or Drugs in the Past 24 Hours? Yes  What Did You Use and How Much? Cocaine , Weed, Pain pills, Alcohol  Do you have any current medical co-morbidities that require immediate attention? No  What Do You Feel Would Help You the Most Today? Housing Assistance  If access to [facility] was not available, would you have sought care in the Emergency Department? No  Determination of Need Routine (7 days)  Options For Referral Outpatient Therapy

## 2024-08-26 NOTE — ED Provider Notes (Incomplete)
 Facility Based Crisis Admission H&P  Date: 08/26/24 Patient Name: Mark Hammond MRN: [id number] Chief Complaint: Withdrawal, wanting detox  Diagnoses:  Final diagnoses:  None    HPI: Pt is a 33 yo male with past psych history of schizophrenia, depression, and multiple substance use disorders who comes in seeking [facility] admission for detox and help with withdrawal symptoms. He reports diarrhea, cough, chest pain, shaking, temperature fluctuations, and fatigue. He reports that he was clean for a few days before having problems with his girlfriend, with whom he is currently living with. He states that he had used Codeine with Tylenol , marijuana, cocaine , and alcohol in the past few days. He believes he is currently withdrawing from alcohol. He reports drinking throughout the entire day, and does not keep count of how many drink he will have. Previous alcohol withdrawal symptoms include diarrhea, shakes, and cold sweats. He denies ever having seizures from alcohol withdrawal. He has previously visited FBCs, which he states were successful in treating him, most recently in August, 2025. He states that he had previous difficulty in this FBC due to heroin withdrawals, which made him more irritable and unable to complete treatment. He currently does not believe his irritability will stand in the way of his treatment, and states that he will participate in treatment and group sessions.   He also endorses hearing voices. He has a history of schizophrenia, and states that the voices have not changed during this withdrawal period. He reports that there are religious commands to attack spirits. During interview, there was a knock on the wall, pt stated that the voices simultaneously counted these knocks.   Pt states that he has been coughing up orange/green phlegm for the past two days. He endorses some mild difficulty with breathing.  Pt denies SI, HI, visual hallucinations.   PHQ 2-9:  Flowsheet Row ED from  05/10/2024 in [facility] ED from 01/14/2024 in [facility] ED from 12/08/2023 in [facility]  Thoughts that you would be better off dead, or of hurting yourself in some way Not at all Several days Not at all  PHQ-9 Total Score 12 14 0    Flowsheet Row ED from 08/26/2024 in [facility] ED from 08/14/2024 in [facility] ED from 08/11/2024 in [facility] Emergency Department at [facility]  C-SSRS RISK CATEGORY Error: Q3, 4, or 5 should not be populated when Q2 is No Error: Q7 should not be populated when Q6 is No No Risk      Total Time spent with patient: {Time; 15 min - 8 hours:17441}  Musculoskeletal  Strength & Muscle Tone: within normal limits Gait & Station: normal Patient leans: N/A  Psychiatric Specialty Exam  Mental Status Exam: General Appearance and Behavior: Casual, Disheveled, and pt laying on his side on assessment room couch,   Orientation:  Full (Time, Place, and Person)  Memory:  Immediate;   Good Recent;   Good Remote;   Good  Attention: Good  Eye Contact:  Good  Speech:  Clear and Coherent and Normal Rate  Language:  Good  Volume:  Normal  Mood: I'm doing poorly due to withdrawals  Affect:  Appropriate, Congruent, Full Range, and pt has low mood due to physical unwellness  Thought Process:  Coherent, Goal Directed, and Linear  Thought Content:  Logical and Hallucinations: Auditory Command:  Religious commands about fighting off spirits  Suicidal Thoughts:  No  Homicidal Thoughts:  No  Judgement:  Fair  Insight:  Good  Psychomotor Activity:  Normal  Akathisia:  No  Fund of Knowledge:  Fair Assets:  Communication Skills Desire for Improvement Housing Leisure Time  Cognition:  WNL  ADL's:  Intact    Details about paranoia, delusions, or hallucinations: Auditory hallucinations including counting knocks on  the wall, commands to attack spirits that he cannot see.    No data recorded  Physical Exam Constitutional:      General: He is not in acute distress.    Appearance: He is ill-appearing. He is not toxic-appearing.  HENT:     Head: Normocephalic and atraumatic.  Pulmonary:     Effort: Pulmonary effort is normal.     Comments: Productive cough present Neurological:     Mental Status: He is alert and oriented to person, place, and time.  Psychiatric:        Attention and Perception: Attention normal. He perceives auditory hallucinations.        Mood and Affect: Mood is depressed (Congruent affect).        Speech: Speech normal.        Behavior: Behavior is actively hallucinating. Behavior is not agitated, aggressive, hyperactive or combative. Behavior is cooperative.        Thought Content: Thought content is not paranoid. Thought content does not include homicidal or suicidal ideation.        Cognition and Memory: Cognition and memory normal.        Judgment: Judgment normal.    Review of Systems  Constitutional:  Positive for chills, diaphoresis and malaise/fatigue.  Respiratory:  Positive for cough, sputum production and shortness of breath.   Cardiovascular:  Positive for chest pain.  Gastrointestinal:  Positive for diarrhea and vomiting.  Neurological:  Negative for seizures.  Psychiatric/Behavioral:  Positive for hallucinations and substance abuse. Negative for depression and suicidal ideas. The patient is not nervous/anxious.     Blood pressure 137/77, pulse 99, temperature 98.9 F (37.2 C), temperature source Oral, resp. rate 19, SpO2 99%. There is no height or weight on file to calculate BMI.  Past Psychiatric History: MDD, methamphetamine use, alcohol use disorder, schizophrenia, schizoaffective disorder, substance induced mood disorder  Is the patient at risk to self? No  Has the patient been a risk to self in the past 6 months? Yes .    Has the patient been a risk  to self within the distant past? Yes   Is the patient a risk to others? No   Has the patient been a risk to others in the past 6 months? No   Has the patient been a risk to others within the distant past? No   Past Medical History: None Family History: Unable to assess Social History: Currently lives with girlfriend, in a house  Last Labs:  Admission on 08/08/2024, Discharged on 08/08/2024  Component Date Value Ref Range Status   Sodium 08/08/2024 138  135 - 145 mmol/L Final   Potassium 08/08/2024 4.0  3.5 - 5.1 mmol/L Final   Chloride 08/08/2024 99  98 - 111 mmol/L Final   CO2 08/08/2024 24  22 - 32 mmol/L Final   Glucose, Bld 08/08/2024 67 (L)  70 - 99 mg/dL Final   Glucose reference range applies only to samples taken after fasting for at least 8 hours.   BUN 08/08/2024 10  6 - 20 mg/dL Final   Creatinine, Ser 08/08/2024 0.57 (L)  0.61 - 1.24 mg/dL Final  Calcium 08/08/2024 9.0  8.9 - 10.3 mg/dL Final   Total Protein 88/93/7974 8.2 (H)  6.5 - 8.1 g/dL Final   Albumin  08/08/2024 4.1  3.5 - 5.0 g/dL Final   AST 88/93/7974 188 (H)  15 - 41 U/L Final   ALT 08/08/2024 107 (H)  0 - 44 U/L Final   Alkaline Phosphatase 08/08/2024 160 (H)  38 - 126 U/L Final   Total Bilirubin 08/08/2024 0.5  0.0 - 1.2 mg/dL Final   GFR, Estimated 08/08/2024 >60  >60 mL/min Final   Comment: (NOTE) Calculated using the CKD-EPI Creatinine Equation (2021)    Anion gap 08/08/2024 15  5 - 15 Final   Performed at [facility], [address], [place], [place] [address]   Alcohol, Ethyl (B) 08/08/2024 35 (H)  <15 mg/dL Final   Comment: (NOTE) For medical purposes only. Performed at [facility], [address], [place], [place] [address]    WBC 08/08/2024 4.6  4.0 - 10.5 K/uL Final   RBC 08/08/2024 5.08  4.22 - 5.81 MIL/uL Final   Hemoglobin 08/08/2024 13.0  13.0 - 17.0 g/dL Final   HCT 88/93/7974 41.8  39.0 - 52.0 % Final   MCV 08/08/2024 82.3  80.0 - 100.0 fL  Final   MCH 08/08/2024 25.6 (L)  26.0 - 34.0 pg Final   MCHC 08/08/2024 31.1  30.0 - 36.0 g/dL Final   RDW 88/93/7974 18.8 (H)  11.5 - 15.5 % Final   Platelets 08/08/2024 253  150 - 400 K/uL Final   nRBC 08/08/2024 0.0  0.0 - 0.2 % Final   Performed at [facility], [address] [place], [place] [address]  Admission on 07/13/2024, Discharged on 07/13/2024  Component Date Value Ref Range Status   Sodium 07/13/2024 135  135 - 145 mmol/L Final   Potassium 07/13/2024 3.6  3.5 - 5.1 mmol/L Final   Chloride 07/13/2024 98  98 - 111 mmol/L Final   CO2 07/13/2024 25  22 - 32 mmol/L Final   Glucose, Bld 07/13/2024 105 (H)  70 - 99 mg/dL Final   Glucose reference range applies only to samples taken after fasting for at least 8 hours.   BUN 07/13/2024 <5 (L)  6 - 20 mg/dL Final   Creatinine, Ser 07/13/2024 0.50 (L)  0.61 - 1.24 mg/dL Final   Calcium 89/88/7974 8.6 (L)  8.9 - 10.3 mg/dL Final   Total Protein 89/88/7974 7.7  6.5 - 8.1 g/dL Final   Albumin  07/13/2024 3.3 (L)  3.5 - 5.0 g/dL Final   AST 89/88/7974 87 (H)  15 - 41 U/L Final   ALT 07/13/2024 74 (H)  0 - 44 U/L Final   Alkaline Phosphatase 07/13/2024 150 (H)  38 - 126 U/L Final   Total Bilirubin 07/13/2024 0.6  0.0 - 1.2 mg/dL Final   GFR, Estimated 07/13/2024 >60  >60 mL/min Final   Comment: (NOTE) Calculated using the CKD-EPI Creatinine Equation (2021)    Anion gap 07/13/2024 12  5 - 15 Final   Performed at [facility], [address] [place], [place] [address]   Alcohol, Ethyl (B) 07/13/2024 <15  <15 mg/dL Final   Comment: (NOTE) For medical purposes only. Performed at [facility], [address], [place], [place] [address]    WBC 07/13/2024 5.2  4.0 - 10.5 K/uL Final   RBC 07/13/2024 4.92  4.22 - 5.81 MIL/uL Final   Hemoglobin 07/13/2024 12.6 (L)  13.0 - 17.0 g/dL Final   HCT 89/88/7974 39.7  39.0 - 52.0 % Final   MCV 07/13/2024 80.7  80.0 - 100.0 fL Final   MCH 07/13/2024 25.6 (L)  26.0  - 34.0 pg Final   MCHC 07/13/2024 31.7  30.0 - 36.0 g/dL Final   RDW 89/88/7974 18.4 (H)  11.5 - 15.5 % Final   Platelets 07/13/2024 269  150 - 400 K/uL Final   nRBC 07/13/2024 0.0  0.0 - 0.2 % Final   Performed at [facility], [address], [place], [place] [address]   Salicylate Lvl 07/13/2024 <7.0 (L)  7.0 - 30.0 mg/dL Final   Performed at [facility], [address], [place], [place] [address]   Acetaminophen  (Tylenol ), Serum 07/13/2024 <10 (L)  10 - 30 ug/mL Final   Comment: (NOTE) Therapeutic concentrations vary significantly. A range of 10-30 ug/mL  may be an effective concentration for many patients. However, some  are best treated at concentrations outside of this range. Acetaminophen  concentrations >150 ug/mL at 4 hours after ingestion  and >50 ug/mL at 12 hours after ingestion are often associated with  toxic reactions.  Performed at [facility], [address], [place], [place] [address]   Admission on 06/02/2024, Discharged on 06/02/2024  Component Date Value Ref Range Status   Sodium 06/02/2024 134 (L)  135 - 145 mmol/L Final   Potassium 06/02/2024 3.4 (L)  3.5 - 5.1 mmol/L Final   Chloride 06/02/2024 96 (L)  98 - 111 mmol/L Final   CO2 06/02/2024 24  22 - 32 mmol/L Final   Glucose, Bld 06/02/2024 123 (H)  70 - 99 mg/dL Final   Glucose reference range applies only to samples taken after fasting for at least 8 hours.   BUN 06/02/2024 <5 (L)  6 - 20 mg/dL Final   Creatinine, Ser 06/02/2024 0.63  0.61 - 1.24 mg/dL Final   Calcium 91/68/7974 9.0  8.9 - 10.3 mg/dL Final   Total Protein 91/68/7974 8.6 (H)  6.5 - 8.1 g/dL Final   Albumin  06/02/2024 3.7  3.5 - 5.0 g/dL Final   AST 91/68/7974 155 (H)  15 - 41 U/L Final   ALT 06/02/2024 86 (H)  0 - 44 U/L Final   Alkaline Phosphatase 06/02/2024 111  38 - 126 U/L Final   Total Bilirubin 06/02/2024 1.1  0.0 - 1.2 mg/dL Final   GFR, Estimated 06/02/2024 >60  >60 mL/min Final   Comment: (NOTE) Calculated  using the CKD-EPI Creatinine Equation (2021)    Anion gap 06/02/2024 14  5 - 15 Final   Performed at [facility], [address], [place], [place] [address]   Alcohol, Ethyl (B) 06/02/2024 <15  <15 mg/dL Final   Comment: (NOTE) For medical purposes only. Performed at [facility], [address] [place], [place] [address]    WBC 06/02/2024 8.7  4.0 - 10.5 K/uL Final   RBC 06/02/2024 4.87  4.22 - 5.81 MIL/uL Final   Hemoglobin 06/02/2024 12.2 (L)  13.0 - 17.0 g/dL Final   HCT 91/68/7974 39.3  39.0 - 52.0 % Final   MCV 06/02/2024 80.7  80.0 - 100.0 fL Final   MCH 06/02/2024 25.1 (L)  26.0 - 34.0 pg Final   MCHC 06/02/2024 31.0  30.0 - 36.0 g/dL Final   RDW 91/68/7974 14.9  11.5 - 15.5 % Final   Platelets 06/02/2024 284  150 - 400 K/uL Final   nRBC  06/02/2024 0.0  0.0 - 0.2 % Final   Performed at [facility], [address] [place], [place] [address]   Opiates 06/02/2024 NONE DETECTED  NONE DETECTED Final   Cocaine  06/02/2024 POSITIVE (A)  NONE DETECTED Final   Benzodiazepines 06/02/2024 POSITIVE (A)  NONE DETECTED Final   Amphetamines 06/02/2024 POSITIVE (A)  NONE DETECTED Final   Comment: (NOTE) Trazodone  is metabolized in vivo to several metabolites, including pharmacologically active m-CPP, which is excreted in the urine. Immunoassay screens for amphetamines and MDMA have potential cross-reactivity with these compounds and may provide false positive  results.     Tetrahydrocannabinol 06/02/2024 POSITIVE (A)  NONE DETECTED Final   Barbiturates 06/02/2024 NONE DETECTED  NONE DETECTED Final   Comment: (NOTE) DRUG SCREEN FOR MEDICAL PURPOSES ONLY.  IF CONFIRMATION IS NEEDED FOR ANY PURPOSE, NOTIFY LAB WITHIN 5 DAYS.  LOWEST DETECTABLE LIMITS FOR URINE DRUG SCREEN Drug Class                     Cutoff (ng/mL) Amphetamine and metabolites    1000 Barbiturate and metabolites    200 Benzodiazepine                 200 Opiates and metabolites         300 Cocaine  and metabolites        300 THC                            50 Performed at [facility], [address], [place], [place] [address]   Admission on 05/23/2024, Discharged on 05/23/2024  Component Date Value Ref Range Status   Sodium 05/23/2024 137  135 - 145 mmol/L Final   Potassium 05/23/2024 3.3 (L)  3.5 - 5.1 mmol/L Final   Chloride 05/23/2024 101  98 - 111 mmol/L Final   CO2 05/23/2024 27  22 - 32 mmol/L Final   Glucose, Bld 05/23/2024 105 (H)  70 - 99 mg/dL Final   Glucose reference range applies only to samples taken after fasting for at least 8 hours.   BUN 05/23/2024 12  6 - 20 mg/dL Final   Creatinine, Ser 05/23/2024 0.57 (L)  0.61 - 1.24 mg/dL Final   Calcium 91/78/7974 8.5 (L)  8.9 - 10.3 mg/dL Final   Total Protein 91/78/7974 7.6  6.5 - 8.1 g/dL Final   Albumin  05/23/2024 3.4 (L)  3.5 - 5.0 g/dL Final   AST 91/78/7974 63 (H)  15 - 41 U/L Final   ALT 05/23/2024 38  0 - 44 U/L Final   Alkaline Phosphatase 05/23/2024 104  38 - 126 U/L Final   Total Bilirubin 05/23/2024 0.4  0.0 - 1.2 mg/dL Final   GFR, Estimated 05/23/2024 >60  >60 mL/min Final   Comment: (NOTE) Calculated using the CKD-EPI Creatinine Equation (2021)    Anion gap 05/23/2024 9  5 - 15 Final   Performed at [facility], [address], [place], [place] [address]   Alcohol, Ethyl (B) 05/23/2024 <15  <15 mg/dL Final   Comment: (NOTE) For medical purposes only. Performed at [facility], [address] [place], [place] [address]    WBC 05/23/2024 6.0  4.0 - 10.5 K/uL Final   RBC 05/23/2024 4.14 (L)  4.22 - 5.81 MIL/uL Final   Hemoglobin 05/23/2024 10.6 (L)  13.0 - 17.0 g/dL Final   HCT 91/78/7974 34.5 (L)  39.0 -  52.0 % Final   MCV 05/23/2024 83.3  80.0 - 100.0 fL Final   MCH 05/23/2024 25.6 (L)  26.0 - 34.0 pg Final   MCHC 05/23/2024 30.7  30.0 - 36.0 g/dL Final   RDW 91/78/7974 14.7  11.5 - 15.5 % Final   Platelets 05/23/2024 254  150 - 400 K/uL  Final   nRBC 05/23/2024 0.0  0.0 - 0.2 % Final   Performed at [facility], [address] [place], [place] [address]  Admission on 05/10/2024, Discharged on 05/12/2024  Component Date Value Ref Range Status   Hgb A1c MFr Bld 05/11/2024 5.6  4.8 - 5.6 % Final   Comment: (NOTE)         Prediabetes: 5.7 - 6.4         Diabetes: >6.4         Glycemic control for adults with diabetes: <7.0    Mean Plasma Glucose 05/11/2024 114  mg/dL Final   Comment: (NOTE) Performed At: [facility] [address] [place], [place] [id number] Jennette Shorter MD Ey:[id number]    Prolactin 05/11/2024 20.5  3.9 - 22.7 ng/mL Final   Comment: (NOTE) Performed At: [facility] [address] [place], [place] [id number] Jennette Shorter MD Ey:[id number]    RPR Ser Ql 05/11/2024 NON REACTIVE  NON REACTIVE Final   Performed at [facility], [address] [place], [place] [address]   Hepatitis B Surface Ag 05/11/2024 NON REACTIVE  NON REACTIVE Final   HCV Ab 05/11/2024 Reactive (A)  NON REACTIVE Final   Comment: (NOTE) The CDC recommends that a Reactive HCV antibody result be followed up  with a HCV Nucleic Acid Amplification test.     Hep A IgM 05/11/2024 NON REACTIVE  NON REACTIVE Final   Hep B C IgM 05/11/2024 NON REACTIVE  NON REACTIVE Final   Performed at [facility], [address] [place], [place] [address]   HIV Screen 4th Generation wRfx 05/11/2024 Non Reactive  Non Reactive Final   Performed at [facility], [address], [place], [place] [address]   TSH 05/11/2024 0.752  0.350 - 4.500 uIU/mL Final   Comment: Performed by a 3rd Generation assay with a functional sensitivity of <=0.01 uIU/mL. Performed at [facility], [address], [place], [place] [address]   Admission on 05/10/2024, Discharged on 05/10/2024  Component Date Value Ref Range Status   WBC 05/10/2024 8.3  4.0 - 10.5 K/uL Final   RBC 05/10/2024 4.14 (L)  4.22 - 5.81  MIL/uL Final   Hemoglobin 05/10/2024 10.9 (L)  13.0 - 17.0 g/dL Final   HCT 91/91/7974 33.7 (L)  39.0 - 52.0 % Final   MCV 05/10/2024 81.4  80.0 - 100.0 fL Final   MCH 05/10/2024 26.3  26.0 - 34.0 pg Final   MCHC 05/10/2024 32.3  30.0 - 36.0 g/dL Final   RDW 91/91/7974 14.1  11.5 - 15.5 % Final   Platelets 05/10/2024 302  150 - 400 K/uL Final   nRBC 05/10/2024 0.0  0.0 - 0.2 % Final   Neutrophils Relative % 05/10/2024 51  % Final   Neutro Abs 05/10/2024 4.2  1.7 - 7.7 K/uL Final   Lymphocytes Relative 05/10/2024 37  % Final   Lymphs Abs 05/10/2024 3.1  0.7 - 4.0 K/uL Final   Monocytes Relative 05/10/2024 10  % Final   Monocytes Absolute 05/10/2024 0.8  0.1 - 1.0 K/uL Final   Eosinophils Relative  05/10/2024 1  % Final   Eosinophils Absolute 05/10/2024 0.1  0.0 - 0.5 K/uL Final   Basophils Relative 05/10/2024 1  % Final   Basophils Absolute 05/10/2024 0.1  0.0 - 0.1 K/uL Final   Immature Granulocytes 05/10/2024 0  % Final   Abs Immature Granulocytes 05/10/2024 0.03  0.00 - 0.07 K/uL Final   Performed at [facility], [address] [place], [place] [address]   Sodium 05/10/2024 138  135 - 145 mmol/L Final   Potassium 05/10/2024 3.9  3.5 - 5.1 mmol/L Final   Chloride 05/10/2024 101  98 - 111 mmol/L Final   CO2 05/10/2024 24  22 - 32 mmol/L Final   Glucose, Bld 05/10/2024 83  70 - 99 mg/dL Final   Glucose reference range applies only to samples taken after fasting for at least 8 hours.   BUN 05/10/2024 <5 (L)  6 - 20 mg/dL Final   Creatinine, Ser 05/10/2024 0.57 (L)  0.61 - 1.24 mg/dL Final   Calcium 91/91/7974 8.8 (L)  8.9 - 10.3 mg/dL Final   Total Protein 91/91/7974 7.7  6.5 - 8.1 g/dL Final   Albumin  05/10/2024 3.5  3.5 - 5.0 g/dL Final   AST 91/91/7974 87 (H)  15 - 41 U/L Final   ALT 05/10/2024 80 (H)  0 - 44 U/L Final   Alkaline Phosphatase 05/10/2024 121  38 - 126 U/L Final   Total Bilirubin 05/10/2024 0.4  0.0 - 1.2 mg/dL Final   GFR, Estimated 05/10/2024 >60  >60  mL/min Final   Comment: (NOTE) Calculated using the CKD-EPI Creatinine Equation (2021)    Anion gap 05/10/2024 13  5 - 15 Final   Performed at [facility], [address] [place], [place] [address]   Alcohol, Ethyl (B) 05/10/2024 126 (H)  <15 mg/dL Final   Comment: (NOTE) For medical purposes only. Performed at [facility], [address] [place], [place] [address]    Cholesterol 05/10/2024 125  0 - 200 mg/dL Final   Triglycerides 91/91/7974 49  <150 mg/dL Final   HDL 91/91/7974 48  >40 mg/dL Final   Total CHOL/HDL Ratio 05/10/2024 2.6  RATIO Final   VLDL 05/10/2024 10  0 - 40 mg/dL Final   LDL Cholesterol 05/10/2024 67  0 - 99 mg/dL Final   Comment:        Total Cholesterol/HDL:CHD Risk Coronary Heart Disease Risk Table                     Men   Women  1/2 Average Risk   3.4   3.3  Average Risk       5.0   4.4  2 X Average Risk   9.6   7.1  3 X Average Risk  23.4   11.0        Use the calculated Patient Ratio above and the CHD Risk Table to determine the patient's CHD Risk.        ATP III CLASSIFICATION (LDL):  <100     mg/dL   Optimal  899-870  mg/dL   Near or Above                    Optimal  130-159  mg/dL   Borderline  839-810  mg/dL   High  >809     mg/dL   Very High Performed at [facility], [address], [place], [place] [address]  POC Amphetamine UR 05/10/2024 None Detected  NONE DETECTED (Cut Off Level 1000 ng/mL) Final   POC Secobarbital (BAR) 05/10/2024 None Detected  NONE DETECTED (Cut Off Level 300 ng/mL) Final   POC Buprenorphine (BUP) 05/10/2024 None Detected  NONE DETECTED (Cut Off Level 10 ng/mL) Final   POC Oxazepam (BZO) 05/10/2024 None Detected  NONE DETECTED (Cut Off Level 300 ng/mL) Final   POC Cocaine  UR 05/10/2024 Positive (A)  NONE DETECTED (Cut Off Level 300 ng/mL) Final   POC Methamphetamine UR 05/10/2024 Positive (A)  NONE DETECTED (Cut Off Level 1000 ng/mL) Final   POC Morphine  05/10/2024 None Detected  NONE  DETECTED (Cut Off Level 300 ng/mL) Final   POC Methadone UR 05/10/2024 None Detected  NONE DETECTED (Cut Off Level 300 ng/mL) Final   POC Oxycodone  UR 05/10/2024 None Detected  NONE DETECTED (Cut Off Level 100 ng/mL) Final   POC Marijuana UR 05/10/2024 None Detected  NONE DETECTED (Cut Off Level 50 ng/mL) Final  Admission on 05/09/2024, Discharged on 05/09/2024  Component Date Value Ref Range Status   Glucose-Capillary 05/09/2024 122 (H)  70 - 99 mg/dL Final   Glucose reference range applies only to samples taken after fasting for at least 8 hours.  Admission on 04/27/2024, Discharged on 04/28/2024  Component Date Value Ref Range Status   WBC 04/27/2024 8.4  4.0 - 10.5 K/uL Final   RBC 04/27/2024 3.91 (L)  4.22 - 5.81 MIL/uL Final   Hemoglobin 04/27/2024 10.3 (L)  13.0 - 17.0 g/dL Final   HCT 92/73/7974 32.3 (L)  39.0 - 52.0 % Final   MCV 04/27/2024 82.6  80.0 - 100.0 fL Final   MCH 04/27/2024 26.3  26.0 - 34.0 pg Final   MCHC 04/27/2024 31.9  30.0 - 36.0 g/dL Final   RDW 92/73/7974 13.5  11.5 - 15.5 % Final   Platelets 04/27/2024 299  150 - 400 K/uL Final   nRBC 04/27/2024 0.0  0.0 - 0.2 % Final   Neutrophils Relative % 04/27/2024 63  % Final   Neutro Abs 04/27/2024 5.4  1.7 - 7.7 K/uL Final   Lymphocytes Relative 04/27/2024 22  % Final   Lymphs Abs 04/27/2024 1.8  0.7 - 4.0 K/uL Final   Monocytes Relative 04/27/2024 13  % Final   Monocytes Absolute 04/27/2024 1.1 (H)  0.1 - 1.0 K/uL Final   Eosinophils Relative 04/27/2024 0  % Final   Eosinophils Absolute 04/27/2024 0.0  0.0 - 0.5 K/uL Final   Basophils Relative 04/27/2024 1  % Final   Basophils Absolute 04/27/2024 0.1  0.0 - 0.1 K/uL Final   Immature Granulocytes 04/27/2024 1  % Final   Abs Immature Granulocytes 04/27/2024 0.04  0.00 - 0.07 K/uL Final   Performed at [facility], [address], [place], [place] [address]   Sodium 04/27/2024 132 (L)  135 - 145 mmol/L Final   Potassium 04/27/2024 3.6  3.5 - 5.1  mmol/L Final   Chloride 04/27/2024 98  98 - 111 mmol/L Final   CO2 04/27/2024 25  22 - 32 mmol/L Final   Glucose, Bld 04/27/2024 128 (H)  70 - 99 mg/dL Final   Glucose reference range applies only to samples taken after fasting for at least 8 hours.   BUN 04/27/2024 13  6 - 20 mg/dL Final   Creatinine, Ser 04/27/2024 0.52 (L)  0.61 - 1.24 mg/dL Final   Calcium 92/73/7974 8.9  8.9 - 10.3 mg/dL Final   GFR, Estimated 04/27/2024 >60  >  60 mL/min Final   Comment: (NOTE) Calculated using the CKD-EPI Creatinine Equation (2021)    Anion gap 04/27/2024 9  5 - 15 Final   Performed at [facility], [address] [place], [place] [address]   Alcohol, Ethyl (B) 04/27/2024 <15  <15 mg/dL Final   Comment: (NOTE) For medical purposes only. Performed at [facility], [address], [place], [place] [address]    Opiates 04/27/2024 POSITIVE (A)  NONE DETECTED Final   Cocaine  04/27/2024 POSITIVE (A)  NONE DETECTED Final   Benzodiazepines 04/27/2024 NONE DETECTED  NONE DETECTED Final   Amphetamines 04/27/2024 NONE DETECTED  NONE DETECTED Final   Tetrahydrocannabinol 04/27/2024 POSITIVE (A)  NONE DETECTED Final   Barbiturates 04/27/2024 NONE DETECTED  NONE DETECTED Final   Comment: (NOTE) DRUG SCREEN FOR MEDICAL PURPOSES ONLY.  IF CONFIRMATION IS NEEDED FOR ANY PURPOSE, NOTIFY LAB WITHIN 5 DAYS.  LOWEST DETECTABLE LIMITS FOR URINE DRUG SCREEN Drug Class                     Cutoff (ng/mL) Amphetamine and metabolites    1000 Barbiturate and metabolites    200 Benzodiazepine                 200 Opiates and metabolites        300 Cocaine  and metabolites        300 THC                            50 Performed at [facility], [address], [place], [place] [address]   Admission on 04/09/2024, Discharged on 04/10/2024  Component Date Value Ref Range Status   Sodium 04/09/2024 136  135 - 145 mmol/L Final   Potassium 04/09/2024 3.2 (L)   3.5 - 5.1 mmol/L Final   Chloride 04/09/2024 100  98 - 111 mmol/L Final   CO2 04/09/2024 22  22 - 32 mmol/L Final   Glucose, Bld 04/09/2024 94  70 - 99 mg/dL Final   Glucose reference range applies only to samples taken after fasting for at least 8 hours.   BUN 04/09/2024 <5 (L)  6 - 20 mg/dL Final   Creatinine, Ser 04/09/2024 0.59 (L)  0.61 - 1.24 mg/dL Final   Calcium 92/91/7974 8.7 (L)  8.9 - 10.3 mg/dL Final   Total Protein 92/91/7974 8.5 (H)  6.5 - 8.1 g/dL Final   Albumin  04/09/2024 4.0  3.5 - 5.0 g/dL Final   AST 92/91/7974 78 (H)  15 - 41 U/L Final   ALT 04/09/2024 33  0 - 44 U/L Final   Alkaline Phosphatase 04/09/2024 120  38 - 126 U/L Final   Total Bilirubin 04/09/2024 0.7  0.0 - 1.2 mg/dL Final   GFR, Estimated 04/09/2024 >60  >60 mL/min Final   Comment: (NOTE) Calculated using the CKD-EPI Creatinine Equation (2021)    Anion gap 04/09/2024 14  5 - 15 Final   Performed at [facility], [address], [place], [place] [address]   Alcohol, Ethyl (B) 04/09/2024 125 (H)  <15 mg/dL Final   Comment: (NOTE) For medical purposes only. Performed at [facility], [address], [place], [place] [address]    Opiates 04/09/2024 NONE DETECTED  NONE DETECTED Final   Cocaine  04/09/2024 POSITIVE (A)  NONE DETECTED Final   Benzodiazepines 04/09/2024 POSITIVE (A)  NONE DETECTED Final   Amphetamines 04/09/2024 POSITIVE (  A)  NONE DETECTED Final   Comment: (NOTE) Trazodone  is metabolized in vivo to several metabolites, including pharmacologically active m-CPP, which is excreted in the urine. Immunoassay screens for amphetamines and MDMA have potential cross-reactivity with these compounds and may provide false positive  results.     Tetrahydrocannabinol 04/09/2024 NONE DETECTED  NONE DETECTED Final   Barbiturates 04/09/2024 NONE DETECTED  NONE DETECTED Final   Comment: (NOTE) DRUG SCREEN FOR MEDICAL PURPOSES ONLY.  IF CONFIRMATION IS NEEDED FOR  ANY PURPOSE, NOTIFY LAB WITHIN 5 DAYS.  LOWEST DETECTABLE LIMITS FOR URINE DRUG SCREEN Drug Class                     Cutoff (ng/mL) Amphetamine and metabolites    1000 Barbiturate and metabolites    200 Benzodiazepine                 200 Opiates and metabolites        300 Cocaine  and metabolites        300 THC                            50 Performed at [facility], [address] [place], [place] [address]    WBC 04/09/2024 8.1  4.0 - 10.5 K/uL Final   RBC 04/09/2024 4.07 (L)  4.22 - 5.81 MIL/uL Final   Hemoglobin 04/09/2024 11.2 (L)  13.0 - 17.0 g/dL Final   HCT 92/91/7974 34.8 (L)  39.0 - 52.0 % Final   MCV 04/09/2024 85.5  80.0 - 100.0 fL Final   MCH 04/09/2024 27.5  26.0 - 34.0 pg Final   MCHC 04/09/2024 32.2  30.0 - 36.0 g/dL Final   RDW 92/91/7974 13.2  11.5 - 15.5 % Final   Platelets 04/09/2024 411 (H)  150 - 400 K/uL Final   nRBC 04/09/2024 0.0  0.0 - 0.2 % Final   Neutrophils Relative % 04/09/2024 60  % Final   Neutro Abs 04/09/2024 4.9  1.7 - 7.7 K/uL Final   Lymphocytes Relative 04/09/2024 30  % Final   Lymphs Abs 04/09/2024 2.4  0.7 - 4.0 K/uL Final   Monocytes Relative 04/09/2024 7  % Final   Monocytes Absolute 04/09/2024 0.6  0.1 - 1.0 K/uL Final   Eosinophils Relative 04/09/2024 1  % Final   Eosinophils Absolute 04/09/2024 0.1  0.0 - 0.5 K/uL Final   Basophils Relative 04/09/2024 2  % Final   Basophils Absolute 04/09/2024 0.1  0.0 - 0.1 K/uL Final   Immature Granulocytes 04/09/2024 0  % Final   Abs Immature Granulocytes 04/09/2024 0.02  0.00 - 0.07 K/uL Final   Performed at [facility], [address], [place], [place] [address]  Admission on 04/08/2024, Discharged on 04/08/2024  Component Date Value Ref Range Status   Sodium 04/08/2024 136  135 - 145 mmol/L Final   Potassium 04/08/2024 3.3 (L)  3.5 - 5.1 mmol/L Final   Chloride 04/08/2024 97 (L)  98 - 111 mmol/L Final   CO2 04/08/2024 22  22 - 32 mmol/L Final    Glucose, Bld 04/08/2024 96  70 - 99 mg/dL Final   Glucose reference range applies only to samples taken after fasting for at least 8 hours.   BUN 04/08/2024 5 (L)  6 - 20 mg/dL Final   Creatinine, Ser 04/08/2024 0.61  0.61 - 1.24 mg/dL Final   Calcium 92/92/7974 8.6 (L)  8.9 -  10.3 mg/dL Final   Total Protein 92/92/7974 7.9  6.5 - 8.1 g/dL Final   Albumin  04/08/2024 3.6  3.5 - 5.0 g/dL Final   AST 92/92/7974 71 (H)  15 - 41 U/L Final   ALT 04/08/2024 30  0 - 44 U/L Final   Alkaline Phosphatase 04/08/2024 100  38 - 126 U/L Final   Total Bilirubin 04/08/2024 0.5  0.0 - 1.2 mg/dL Final   GFR, Estimated 04/08/2024 >60  >60 mL/min Final   Comment: (NOTE) Calculated using the CKD-EPI Creatinine Equation (2021)    Anion gap 04/08/2024 17 (H)  5 - 15 Final   Performed at [facility], [address], [place], [place] [address]   Alcohol, Ethyl (B) 04/08/2024 100 (H)  <15 mg/dL Final   Comment: (NOTE) For medical purposes only. Performed at [facility], [address], [place], [place] [address]    WBC 04/08/2024 6.8  4.0 - 10.5 K/uL Final   RBC 04/08/2024 4.14 (L)  4.22 - 5.81 MIL/uL Final   Hemoglobin 04/08/2024 11.6 (L)  13.0 - 17.0 g/dL Final   HCT 92/92/7974 35.0 (L)  39.0 - 52.0 % Final   MCV 04/08/2024 84.5  80.0 - 100.0 fL Final   MCH 04/08/2024 28.0  26.0 - 34.0 pg Final   MCHC 04/08/2024 33.1  30.0 - 36.0 g/dL Final   RDW 92/92/7974 13.2  11.5 - 15.5 % Final   Platelets 04/08/2024 407 (H)  150 - 400 K/uL Final   nRBC 04/08/2024 0.0  0.0 - 0.2 % Final   Performed at [facility], [address] [place], [place] [address]   Opiates 04/08/2024 NONE DETECTED  NONE DETECTED Final   Cocaine  04/08/2024 POSITIVE (A)  NONE DETECTED Final   Benzodiazepines 04/08/2024 POSITIVE (A)  NONE DETECTED Final   Amphetamines 04/08/2024 POSITIVE (A)  NONE DETECTED Final   Comment: (NOTE) Trazodone  is metabolized in vivo to several metabolites, including pharmacologically  active m-CPP, which is excreted in the urine. Immunoassay screens for amphetamines and MDMA have potential cross-reactivity with these compounds and may provide false positive  results.     Tetrahydrocannabinol 04/08/2024 NONE DETECTED  NONE DETECTED Final   Barbiturates 04/08/2024 NONE DETECTED  NONE DETECTED Final   Comment: (NOTE) DRUG SCREEN FOR MEDICAL PURPOSES ONLY.  IF CONFIRMATION IS NEEDED FOR ANY PURPOSE, NOTIFY LAB WITHIN 5 DAYS.  LOWEST DETECTABLE LIMITS FOR URINE DRUG SCREEN Drug Class                     Cutoff (ng/mL) Amphetamine and metabolites    1000 Barbiturate and metabolites    200 Benzodiazepine                 200 Opiates and metabolites        300 Cocaine  and metabolites        300 THC                            50 Performed at [facility], [address] [place], [place] [address]    Acetaminophen  (Tylenol ), Serum 04/08/2024 <10 (L)  10 - 30 ug/mL Final   Comment: (NOTE) Therapeutic concentrations vary significantly. A range of 10-30 ug/mL  may be an effective concentration for many patients. However, some  are best treated at concentrations outside of this range. Acetaminophen  concentrations >150 ug/mL at 4 hours after ingestion  and >  50 ug/mL at 12 hours after ingestion are often associated with  toxic reactions.  Performed at [facility], [address] [place], [place] [address]    Salicylate Lvl 04/08/2024 <7.0 (L)  7.0 - 30.0 mg/dL Final   Performed at [facility], [address], [place], [place] [address]  There may be more visits with results that are not included.    Allergies: Codeine, Fish allergy, and Shellfish allergy  Medications:  PTA Medications  Medication Sig   hydrOXYzine  (ATARAX ) 25 MG tablet Take 25 mg by mouth every 6 (six) hours as needed for anxiety. (Patient not taking: Reported on 05/23/2024)   ARIPiprazole  (ABILIFY ) 5 MG tablet Take 5 mg by mouth daily.   QUEtiapine  (SEROQUEL ) 50 MG tablet  Take 50 mg by mouth at bedtime.   naproxen  (NAPROSYN ) 375 MG tablet Take 1 tablet (375 mg total) by mouth 2 (two) times daily. (Patient not taking: Reported on 05/23/2024)   sertraline  (ZOLOFT ) 50 MG tablet Take 50 mg by mouth daily.   erythromycin  ophthalmic ointment Place a 1/2 inch ribbon of ointment into the lower eyelid BID.    Long Term Goals: Improvement in symptoms so as ready for discharge  Short Term Goals: Patient will verbalize feelings in meetings with treatment team members., Patient will attend at least of 50% of the groups daily., Pt will complete the PHQ9 on admission, day 3 and discharge., Patient will participate in completing the [place] Suicide Severity Rating Scale, Patient will score a low risk of violence for 24 hours prior to discharge, and Patient will take medications as prescribed daily.  Medical Decision Making  Pt is a 33 yo male with past psychiatric history of schizophrenia, depression, and multiple substance use disorders presenting to [facility] due to alcohol withdrawals and requesting admission to [facility] for detox. He recently used multiple substances including no prescription Codeine, marijuana, cocaine , and alcohol. Pt reports multiple physical symptoms of withdrawal from alcohol including diarrhea, shaking, temperature fluctuations, and malaise. He states that he last used 1 day ago and that he will generally drink all day. He has a history of being homeless, but is currently living with his girlfriend in a house, which he can return to after treatment. He has a history of visiting [facility]BCs and behavioral health hospitals due to substance use. He has previously visited this [facility], but left early due to agitation towards nursing staff and not participating in treatments. 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 He is not currently being treated for his schizophrenia and does endorse  auditory hallucinations, including commands to attack spirits. However, there does not appear to be any imminent threat to self or others. He also reports new productive cough, chest pain, and mild difficulty breathing. Consider URI vs pneumonia. Monitor for time being, if symptoms don't improve or worsen, consider sending to ED. At this time, [facility] is appropriate for substance use and withdrawal treatment. Collect UDS.     Recommendations  Based on my evaluation the patient does not appear to have an emergency medical condition.  Karl Punch, Medical Student 08/26/24  9:53 AM

## 2024-08-26 NOTE — BH Assessment (Signed)
 Comprehensive Clinical Assessment (CCA) Note  08/26/2024 Mark Hammond [id number]  Disposition: Per Alfornia Light, DO admission to [facility] is recommended.    The patient demonstrates the following risk factors for suicide: Chronic risk factors for suicide include: psychiatric disorder of Schizoaffective Disorder vs Substance Induced Mood Disorder, substance use disorder, previous suicide attempts mult by fentanyl  OD attempts, and demographic factors (male, >25 y/o). Acute risk factors for suicide include: family or marital conflict and unemployment. Protective factors for this patient include: positive social support, responsibility to others (children, family), and hope for the future. Considering these factors, the overall suicide risk at this point appears to be low. Patient is appropriate for outpatient follow up.  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  Patient presents unaccompanied, voluntarily.  Patient states he has been experiencing withdrawal symptoms to include tremors, vomiting, chest pain and diarrhea.   Patient has multiple presentations here and at local ED's this year and he completed treatment at [facility] most recently in August 2025.  Patient states he has been admitted for inpatient psychiatric treatment multiple times, and most recently discharged from [facility] on 05/07/2024. Patient reports hx of NSSIB, by cutting and hx of multiple suicide attempts by fentanyl  overdose. He denies current SI and HI.  He reports ongoing AH since I was born.  He states he hears the voice of god and the devil often.  He has yet to follow up with outpatient MH or SA providers as has been recommended multiple times.   Patient reports the trigger to this recent relapse was problems with his girlfriend, with whom he lives.  Patient reports he has  been using crack cocaine  and alcohol for the past few months.  He struggles to quantify alcohol use, stating he does not keep count of how many drinks he has, although he does report he drinks throughout the day.   Patient is seeking detox.   Patient denies SI, HI or current AVH.    Chief Complaint:  Chief Complaint  Patient presents with   Malingering    Visit Diagnosis: Substance Induced Mood Disorder vs Schizoaffective Disorder                             Stimulant Use Disorder, cocaine  type, moderate                             Alcohol Use Disorder, severe    CCA Screening, Triage and Referral (STR)  Patient Reported Information How did you hear about us ? Self  What Is the Reason for Your Visit/Call Today? [name] presents back to [facility] unaccompanied. Pt states he attempted to call 911 just now on the [facility] phone because he is having pain and withdrawls. Pt states he wanted to go to the ED, but had claimed that the disbatch told him to check in here at the [facility] so he could get sent out to the ED. Pt states he is homeless and and needing a place to stay. Pt has been to the ED 28 times due to homelessness. Pt reports that he is wanting to go to the ED at this time of triage. Pt states that he used Cocaine , Weed and Pain pills in the past 24 hours (roughly a gram each). Pt  also states he drank 40oz of alcohol yesterday. Pt denies Si, Hi and AVH.  How Long Has This Been Causing You Problems? > than 6 months  What Do You Feel Would Help You the Most Today? Housing Assistance   Have You Recently Had Any Thoughts About Hurting Yourself? No  Are You Planning to Commit Suicide/Harm Yourself At This time? No   Flowsheet Row ED from 08/26/2024 in [facility] ED from 08/14/2024 in [facility] ED from 08/11/2024 in [facility] Emergency Department at [facility]  C-SSRS RISK CATEGORY Error: Q3, 4, or 5 should not be populated  when Q2 is No Error: Q7 should not be populated when Q6 is No No Risk    Have you Recently Had Thoughts About Hurting Someone [name]? No  Are You Planning to Harm Someone at This Time? No  Explanation: N/A   Have You Used Any Alcohol or Drugs in the Past 24 Hours? Yes  How Long Ago Did You Use Drugs or Alcohol? yesterday  What Did You Use and How Much? Cocaine , Weed, Pain pills, Alcohol   Do You Currently Have a Therapist/Psychiatrist? No  Name of Therapist/Psychiatrist:    Have You Been Recently Discharged From Any Office Practice or Programs? Yes  Explanation of Discharge From Practice/Program: D/C from [facility] on 05/07/24     CCA Screening Triage Referral Assessment Type of Contact: Face-to-Face  Telemedicine Service Delivery:   Is this Initial or Reassessment?   Date Telepsych consult ordered in [facility]:    Time Telepsych consult ordered in [facility]:    Location of Assessment: [facility] Assessment Services  Provider Location: [facility] Assessment Services   Collateral Involvement: None   Does Patient Have a Automotive Engineer Guardian? No  Legal Guardian Contact Information: N/A  Copy of Legal Guardianship Form: -- (N/A)  Legal Guardian Notified of Arrival: -- (N/A)  Legal Guardian Notified of Pending Discharge: -- (N/A)  If Minor and Not Living with Parent(s), Who has Custody? N/A  Is CPS involved or ever been involved? Never  Is APS involved or ever been involved? Never   Patient Determined To Be At Risk for Harm To Self or Others Based on Review of Patient Reported Information or Presenting Complaint? No  Method: -- (N/A, no HI)  Availability of Means: -- (N/A, no HI)  Intent: -- (N/A, no HI)  Notification Required: -- (N/A, no HI)  Additional Information for Danger to Others Potential: -- (N/A, no HI)  Additional Comments for Danger to Others Potential: N/A, no HI  Are There Guns or Other Weapons in Your Home? No  Types of Guns/Weapons: N/A  Are These  Weapons Safely Secured?                            No  Who Could Verify You Are Able To Have These Secured: N/A  Do You Have any Outstanding Charges, Pending Court Dates, Parole/Probation? N/A  Contacted To Inform of Risk of Harm To Self or Others: -- (N/A, no HI)    Does Patient Present under Involuntary Commitment? No    [place] of Residence: [place]   Patient Currently Receiving the Following Services: Not Receiving Services   Determination of Need: Urgent (48 hours)   Options For Referral: Outpatient Therapy; Facility-Based Crisis; Chemical Dependency Intensive Outpatient Therapy (CDIOP)     CCA Biopsychosocial Patient Reported Schizophrenia/Schizoaffective Diagnosis in Past: No  Strengths: Patient is seeking treatment. Reports girlfriend is supportive.   Mental Health Symptoms Depression:  Worthlessness; Difficulty Concentrating (Isolation.)   Duration of Depressive symptoms: Duration of Depressive Symptoms: Greater than two weeks   Mania:  None   Anxiety:   Tension; Worrying   Psychosis:  None   Duration of Psychotic symptoms:    Trauma:  None   Obsessions:  None   Compulsions:  None   Inattention:  N/A   Hyperactivity/Impulsivity:  N/A   Oppositional/Defiant Behaviors:  N/A   Emotional Irregularity:  None   Other Mood/Personality Symptoms:  NA    Mental Status Exam Appearance and self-care  Stature:  Small   Weight:  Average weight   Clothing:  Casual (hospital scrubs)   Grooming:  Normal   Cosmetic use:  None   Posture/gait:  Normal   Motor activity:  Not Remarkable (Pt uses a walker.)   Sensorium  Attention:  Normal   Concentration:  Normal   Orientation:  X5   Recall/memory:  Normal   Affect and Mood  Affect:  Congruent   Mood:  Depressed   Relating  Eye contact:  Normal   Facial expression:  Responsive   Attitude toward examiner:  Cooperative   Thought and Language  Speech flow: Normal   Thought  content:  Appropriate to Mood and Circumstances   Preoccupation:  None   Hallucinations:  Auditory   Organization:  Patent Examiner of Knowledge:  Average   Intelligence:  Average   Abstraction:  Normal   Judgement:  Fair   Dance Movement Psychotherapist:  Variable   Insight:  Fair   Decision Making:  Normal   Social Functioning  Social Maturity:  Impulsive; Irresponsible   Social Judgement:  Chief Of Staff   Stress  Stressors:  Transitions; Financial   Coping Ability:  Exhausted; Overwhelmed   Skill Deficits:  Self-control; Responsibility   Supports:  Friends/Service system     Religion: Religion/Spirituality Are You A Religious Person?: No How Might This Affect Treatment?: NA  Leisure/Recreation: Leisure / Recreation Do You Have Hobbies?: Yes Leisure and Hobbies: Rap, listening to music,  Exercise/Diet: Exercise/Diet Do You Exercise?: No Have You Gained or Lost A Significant Amount of Weight in the Past Six Months?: No Do You Follow a Special Diet?: No Do You Have Any Trouble Sleeping?: Yes Explanation of Sleeping Difficulties: varies   CCA Employment/Education Employment/Work Situation: Employment / Work Situation Employment Situation: Unemployed Patient's Job has Been Impacted by Current Illness: No Has Patient ever Been in Equities Trader?: No  Education: Education Is Patient Currently Attending School?: No Last Grade Completed: 9 Did You Product Manager?: No Did You Have An Individualized Education Program (IIEP): No Did You Have Any Difficulty At [employer]?: No Patient's Education Has Been Impacted by Current Illness: No   CCA Family/Childhood History Family and Relationship History: Family history Marital status: Single Does patient have children?: No  Childhood History:  Childhood History By whom was/is the patient raised?: Grandparents Midwife.) Did patient suffer any verbal/emotional/physical/sexual abuse as a child?: No Did  patient suffer from severe childhood neglect?: No Has patient ever been sexually abused/assaulted/raped as an adolescent or adult?: No Was the patient ever a victim of a crime or a disaster?: No Witnessed domestic violence?: No Has patient been affected by domestic violence as an adult?: No       CCA Substance Use Alcohol/Drug Use: Alcohol / Drug Use Pain Medications: See MAR Prescriptions: See MAR Over  the Counter: See MAR History of alcohol / drug use?: Yes Longest period of sobriety (when/how long): 1.5 months (when he was in the hospital) June 2025 Negative Consequences of Use: Financial, Personal relationships, Work / Programmer, Multimedia Withdrawal Symptoms: Diarrhea, Nausea / Vomiting, Tremors, Fever / Chills Substance #1 Name of Substance 1: ETOH 1 - Age of First Use: unknown Substance #2 Name of Substance 2: Cocaine  2 - Age of First Use: 23 2 - Amount (size/oz): $20 2 - Frequency: daily 2 - Duration: 2-3 mos 2 - Last Use / Amount: yesterday - amt unknown 2 - Method of Aquiring: NA 2 - Route of Substance Use: NA                     ASAM's:  Six Dimensions of Multidimensional Assessment  Dimension 1:  Acute Intoxication and/or Withdrawal Potential:   Dimension 1:  Description of individual's past and current experiences of substance use and withdrawal: Some mild w/d sx of nausea, diarrhea and tremors.  Dimension 2:  Biomedical Conditions and Complications:   Dimension 2:  Description of patient's biomedical conditions and  complications: Per chart, pt has hip surgey on 01/31/24 - 02/16/24 for Left hip/femur cellulitis, osteomyelitis and septic arthritis, pt uses a walker.  Dimension 3:  Emotional, Behavioral, or Cognitive Conditions and Complications:  Dimension 3:  Description of emotional, behavioral, or cognitive conditions and complications: Per chart, pt dx with schizoaffective disorder - currently untreated  Dimension 4:  Readiness to Change:  Dimension 4:  Description  of Readiness to Change criteria: Patient states he plans to engage in treatment if admitted to [facility].  Dimension 5:  Relapse, Continued use, or Continued Problem Potential:  Dimension 5:  Relapse, continued use, or continued problem potential critiera description: Ongoing SA issues for years - no f/u treatment when recommended  Dimension 6:  Recovery/Living Environment:  Dimension 6:  Recovery/Iiving environment criteria description: Pt reports, he lives with his girlfrined.  ASAM Severity Score: ASAM's Severity Rating Score: 9  ASAM Recommended Level of Treatment: ASAM Recommended Level of Treatment: Level III Residential Treatment   Substance use Disorder (SUD) Substance Use Disorder (SUD)  Checklist Symptoms of Substance Use: Continued use despite having a persistent/recurrent physical/psychological problem caused/exacerbated by use, Continued use despite persistent or recurrent social, interpersonal problems, caused or exacerbated by use, Large amounts of time spent to obtain, use or recover from the substance(s), Persistent desire or unsuccessful efforts to cut down or control use, Repeated use in physically hazardous situations, Social, occupational, recreational activities given up or reduced due to use, Substance(s) often taken in larger amounts or over longer times than was intended  Recommendations for Services/Supports/Treatments: Recommendations for Services/Supports/Treatments Recommendations For Services/Supports/Treatments: Medication Management, Individual Therapy, Inpatient Hospitalization, Facility Based Crisis  Disposition Recommendation per psychiatric provider: We recommend transfer to [facility]. 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   Polysubstance dependence (HCC) 12/13/2023   Polysubstance abuse (HCC) 11/21/2023   Alcohol use  disorder 08/04/2023   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

## 2024-08-27 DIAGNOSIS — F1523 Other stimulant dependence with withdrawal: Secondary | ICD-10-CM | POA: Diagnosis not present

## 2024-08-27 DIAGNOSIS — F1220 Cannabis dependence, uncomplicated: Secondary | ICD-10-CM | POA: Diagnosis not present

## 2024-08-27 DIAGNOSIS — F10239 Alcohol dependence with withdrawal, unspecified: Secondary | ICD-10-CM | POA: Diagnosis not present

## 2024-08-27 DIAGNOSIS — F1123 Opioid dependence with withdrawal: Secondary | ICD-10-CM | POA: Diagnosis not present

## 2024-08-27 NOTE — Group Note (Signed)
 Group Topic: Relapse and Recovery  Group Date: 08/27/2024 Start Time: 1430 End Time: 1500 Facilitators: Jany Buckwalter, Zane HERO, RN  Department: [facility]  Number of Participants: 1  Group Focus: nursing group Treatment Modality:  Individual Therapy Interventions utilized were patient education and support Purpose: express feelings and increase insight  Name: Mark Hammond Date of Birth: [birth year]  MR: [id number]    Level of Participation: minimal Quality of Participation: cooperative and passive Interactions with others: gave feedback Mood/Affect: irritable Triggers (if applicable): None identified at this time Cognition: coherent/clear and not focused Progress: Minimal Response: Patient very passive with interaction, isolative to room. Patient irritable but answers questions appropriately. No concerns voiced at this time. Denies need for agitation medications for reported irritability at this time. 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   Polysubstance dependence (HCC) 12/13/2023   Polysubstance abuse (HCC) 11/21/2023   Alcohol use disorder 08/04/2023   Stimulant use disorder 08/04/2023   Tobacco use disorder 08/04/2023   Cannabis use disorder 08/04/2023   Hepatitis C 03/01/2023   Anxiety state 01/14/2023   Insomnia 01/14/2023   Schizoaffective disorder (HCC) 01/13/2023    Schizophrenia (HCC) 11/15/2019   Substance induced mood disorder (HCC) 10/24/2019   Methamphetamine use disorder, 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

## 2024-08-27 NOTE — Care Management (Addendum)
 [facility] Care Management...  Addendum 2:02 pm  Patient will discharge to home 08/29/2024  Writer met with patient to discuss discharge planning.  Patient declined inpatient treatment  Patient was open to CD-IOP  Writer coordinated an intake appointment for 09/04/2024 @ 8:30 am  [facility]  [address] [place], [place] [phone]

## 2024-08-27 NOTE — ED Provider Notes (Signed)
 Behavioral Health Progress Note  Date and Time: 08/27/2024 4:19 PM Name: Mark Hammond MRN:  [id number]  Subjective:   The patient is a 33 year old male with a long history of multiple substance use disorders (opioid, stimulant, alcohol) who has been seen at this facility numerous times. On the present occasion he presented to the [facility] requesting detox from substances. He was admitted to the facility based crisis.   The patient denies auditory/visual hallucinations.  The patient reports good mood, appetite, and sleep. They deny suicidal and homicidal thoughts. The patient denies side effects from their medications.  Review of systems as below. The patient denies experiencing any withdrawal symptoms.   The patient is noted to be somewhat irritable.  Per chart review, he did not engage with the social worker yesterday.  It seems he did participate somewhat with [place] today.  He is scheduled for CD IOP starting on 12/3.   Diagnosis:  Final diagnoses:  Opioid use with withdrawal (HCC)  Alcohol use disorder, severe, in controlled environment (HCC)  Moderate tetrahydrocannabinol (THC) dependence (HCC)  Auditory hallucinations  Stimulant use disorder  Elevated liver enzymes  Substance induced mood disorder (HCC)    Total Time spent with patient: 15 minutes  Past Psychiatric History: See H&P and above Past Medical History: See H&P and above Family History: See H&P and above Family Psychiatric  History: See H&P and above Social History: See H&P and above  Additional Social History:  none  Sleep: fair  Appetite:  fair  Current Medications:  Current Facility-Administered Medications  Medication Dose Route Frequency Provider Last Rate Last Admin   acetaminophen  (TYLENOL ) tablet 650 mg  650 mg Oral Q6H PRN Marry Clamp, MD   650 mg at 08/27/24 0918   alum & mag hydroxide-simeth (MAALOX/MYLANTA) 200-200-20 MG/5ML suspension 30 mL  30 mL Oral Q4H PRN  Marry Clamp, MD       dextromethorphan -guaiFENesin  (MUCINEX  DM) 30-600 MG per 12 hr tablet 1 tablet  1 tablet Oral BID Marry Clamp, MD   1 tablet at 08/27/24 9081   haloperidol  (HALDOL ) tablet 5 mg  5 mg Oral TID PRN Marry Clamp, MD       And   diphenhydrAMINE  (BENADRYL ) capsule 50 mg  50 mg Oral TID PRN Marry Clamp, MD       haloperidol  lactate (HALDOL ) injection 5 mg  5 mg Intramuscular TID PRN Marry Clamp, MD       And   diphenhydrAMINE  (BENADRYL ) injection 50 mg  50 mg Intramuscular TID PRN Marry Clamp, MD       And   LORazepam  (ATIVAN ) injection 2 mg  2 mg Intramuscular TID PRN Marry Clamp, MD       haloperidol  lactate (HALDOL ) injection 10 mg  10 mg Intramuscular TID PRN Marry Clamp, MD       And   diphenhydrAMINE  (BENADRYL ) injection 50 mg  50 mg Intramuscular TID PRN Marry Clamp, MD       And   LORazepam  (ATIVAN ) injection 2 mg  2 mg Intramuscular TID PRN Marry Clamp, MD       hydrOXYzine  (ATARAX ) tablet 25 mg  25 mg Oral TID PRN Marry Clamp, MD       loperamide  (IMODIUM ) capsule 2-4 mg  2-4 mg Oral PRN Marry Clamp, MD       LORazepam  (ATIVAN ) tablet 1 mg  1 mg Oral Q4H PRN Marry Clamp, MD       magnesium  hydroxide (MILK OF MAGNESIA) suspension 30  mL  30 mL Oral Daily PRN Marry Clamp, MD       multivitamin with minerals tablet 1 tablet  1 tablet Oral Daily Marry Clamp, MD   1 tablet at 08/27/24 9078   nicotine  (NICODERM CQ  - dosed in mg/24 hours) patch 21 mg  21 mg Transdermal Q0600 Marry Clamp, MD       ondansetron  (ZOFRAN -ODT) disintegrating tablet 4 mg  4 mg Oral Q6H PRN Marry Clamp, MD       QUEtiapine  (SEROQUEL ) tablet 50 mg  50 mg Oral BID Marry Clamp, MD   50 mg at 08/27/24 9081   Current Outpatient Medications  Medication Sig Dispense Refill   ARIPiprazole  (ABILIFY ) 10 MG tablet Take 10 mg by mouth daily.     gabapentin  (NEURONTIN ) 300 MG capsule Take 300 mg by mouth 3 (three) times daily.      QUEtiapine  (SEROQUEL ) 50 MG tablet Take 50 mg by mouth at bedtime. (Patient taking differently: Take 50 mg by mouth at bedtime as needed (Sleep).)     hydrOXYzine  (ATARAX ) 25 MG tablet Take 25 mg by mouth every 6 (six) hours as needed for anxiety. (Patient not taking: Reported on 05/23/2024)     naproxen  (NAPROSYN ) 375 MG tablet Take 1 tablet (375 mg total) by mouth 2 (two) times daily. (Patient not taking: Reported on 05/23/2024) 20 tablet 0   sertraline  (ZOLOFT ) 50 MG tablet Take 50 mg by mouth daily. (Patient not taking: Reported on 08/26/2024)      Labs  Lab Results:  Admission on 08/26/2024  Component Date Value Ref Range Status   Hgb A1c MFr Bld 08/26/2024 5.6  4.8 - 5.6 % Final   Comment: (NOTE)         Prediabetes: 5.7 - 6.4         Diabetes: >6.4         Glycemic control for adults with diabetes: <7.0    Mean Plasma Glucose 08/26/2024 114  mg/dL Final   Comment: (NOTE) Performed At: [facility] [address] [place], [place] [id number] Jennette Shorter MD Ey:[id number]   Admission on 08/26/2024, Discharged on 08/26/2024  Component Date Value Ref Range Status   POC Amphetamine UR 08/26/2024 None Detected  NONE DETECTED (Cut Off Level 1000 ng/mL) Final   POC Secobarbital (BAR) 08/26/2024 None Detected  NONE DETECTED (Cut Off Level 300 ng/mL) Final   POC Buprenorphine (BUP) 08/26/2024 None Detected  NONE DETECTED (Cut Off Level 10 ng/mL) Final   POC Oxazepam (BZO) 08/26/2024 Positive (A)  NONE DETECTED (Cut Off Level 300 ng/mL) Final   POC Cocaine  UR 08/26/2024 Positive (A)  NONE DETECTED (Cut Off Level 300 ng/mL) Final   POC Methamphetamine UR 08/26/2024 None Detected  NONE DETECTED (Cut Off Level 1000 ng/mL) Final   POC Morphine  08/26/2024 Positive (A)  NONE DETECTED (Cut Off Level 300 ng/mL) Final   POC Methadone UR 08/26/2024 Positive (A)  NONE DETECTED (Cut Off Level 300 ng/mL) Final   POC Oxycodone  UR 08/26/2024 None Detected  NONE DETECTED (Cut Off Level 100 ng/mL)  Final   POC Marijuana UR 08/26/2024 Positive (A)  NONE DETECTED (Cut Off Level 50 ng/mL) Final   Sodium 08/26/2024 139  135 - 145 mmol/L Final   Potassium 08/26/2024 4.5  3.5 - 5.1 mmol/L Final   Chloride 08/26/2024 102  98 - 111 mmol/L Final   CO2 08/26/2024 27  22 - 32 mmol/L Final   Glucose, Bld 08/26/2024 84  70 - 99 mg/dL Final  Glucose reference range applies only to samples taken after fasting for at least 8 hours.   BUN 08/26/2024 6  6 - 20 mg/dL Final   Creatinine, Ser 08/26/2024 0.63  0.61 - 1.24 mg/dL Final   Calcium 88/75/7974 9.3  8.9 - 10.3 mg/dL Final   Total Protein 88/75/7974 7.8  6.5 - 8.1 g/dL Final   Albumin  08/26/2024 3.6  3.5 - 5.0 g/dL Final   AST 88/75/7974 132 (H)  15 - 41 U/L Final   ALT 08/26/2024 120 (H)  0 - 44 U/L Final   Alkaline Phosphatase 08/26/2024 138 (H)  38 - 126 U/L Final   Total Bilirubin 08/26/2024 0.7  0.0 - 1.2 mg/dL Final   GFR, Estimated 08/26/2024 >60  >60 mL/min Final   Comment: (NOTE) Calculated using the CKD-EPI Creatinine Equation (2021)    Anion gap 08/26/2024 10  5 - 15 Final   Performed at [facility], [address], [place], [place] [address]   WBC 08/26/2024 5.9  4.0 - 10.5 K/uL Final   RBC 08/26/2024 5.14  4.22 - 5.81 MIL/uL Final   Hemoglobin 08/26/2024 13.3  13.0 - 17.0 g/dL Final   HCT 88/75/7974 41.9  39.0 - 52.0 % Final   MCV 08/26/2024 81.5  80.0 - 100.0 fL Final   MCH 08/26/2024 25.9 (L)  26.0 - 34.0 pg Final   MCHC 08/26/2024 31.7  30.0 - 36.0 g/dL Final   RDW 88/75/7974 18.6 (H)  11.5 - 15.5 % Final   Platelets 08/26/2024 233  150 - 400 K/uL Final   nRBC 08/26/2024 0.0  0.0 - 0.2 % Final   Neutrophils Relative % 08/26/2024 64  % Final   Neutro Abs 08/26/2024 3.8  1.7 - 7.7 K/uL Final   Lymphocytes Relative 08/26/2024 23  % Final   Lymphs Abs 08/26/2024 1.4  0.7 - 4.0 K/uL Final   Monocytes Relative 08/26/2024 11  % Final   Monocytes Absolute 08/26/2024 0.7  0.1 - 1.0 K/uL Final   Eosinophils Relative  08/26/2024 1  % Final   Eosinophils Absolute 08/26/2024 0.0  0.0 - 0.5 K/uL Final   Basophils Relative 08/26/2024 1  % Final   Basophils Absolute 08/26/2024 0.1  0.0 - 0.1 K/uL Final   Immature Granulocytes 08/26/2024 0  % Final   Abs Immature Granulocytes 08/26/2024 0.02  0.00 - 0.07 K/uL Final   Performed at [facility], [address], [place], [place] [address]   Alcohol, Ethyl (B) 08/26/2024 <15  <15 mg/dL Final   Comment: (NOTE) For medical purposes only. Performed at [facility], [address], [place], [place] [address]   Admission on 08/08/2024, Discharged on 08/08/2024  Component Date Value Ref Range Status   Sodium 08/08/2024 138  135 - 145 mmol/L Final   Potassium 08/08/2024 4.0  3.5 - 5.1 mmol/L Final   Chloride 08/08/2024 99  98 - 111 mmol/L Final   CO2 08/08/2024 24  22 - 32 mmol/L Final   Glucose, Bld 08/08/2024 67 (L)  70 - 99 mg/dL Final   Glucose reference range applies only to samples taken after fasting for at least 8 hours.   BUN 08/08/2024 10  6 - 20 mg/dL Final   Creatinine, Ser 08/08/2024 0.57 (L)  0.61 - 1.24 mg/dL Final   Calcium 88/93/7974 9.0  8.9 - 10.3 mg/dL Final   Total Protein 88/93/7974 8.2 (H)  6.5 - 8.1 g/dL Final   Albumin  08/08/2024 4.1  3.5 - 5.0 g/dL Final   AST 88/93/7974 188 (H)  15 - 41 U/L Final   ALT 08/08/2024 107 (H)  0 - 44 U/L Final   Alkaline Phosphatase 08/08/2024 160 (H)  38 - 126 U/L Final   Total Bilirubin 08/08/2024 0.5  0.0 - 1.2 mg/dL Final   GFR, Estimated 08/08/2024 >60  >60 mL/min Final   Comment: (NOTE) Calculated using the CKD-EPI Creatinine Equation (2021)    Anion gap 08/08/2024 15  5 - 15 Final   Performed at [facility], [address] [place], [place] [address]   Alcohol, Ethyl (B) 08/08/2024 35 (H)  <15 mg/dL Final   Comment: (NOTE) For medical purposes only. Performed at [facility], [address], [place], [place] [address]    WBC 08/08/2024 4.6   4.0 - 10.5 K/uL Final   RBC 08/08/2024 5.08  4.22 - 5.81 MIL/uL Final   Hemoglobin 08/08/2024 13.0  13.0 - 17.0 g/dL Final   HCT 88/93/7974 41.8  39.0 - 52.0 % Final   MCV 08/08/2024 82.3  80.0 - 100.0 fL Final   MCH 08/08/2024 25.6 (L)  26.0 - 34.0 pg Final   MCHC 08/08/2024 31.1  30.0 - 36.0 g/dL Final   RDW 88/93/7974 18.8 (H)  11.5 - 15.5 % Final   Platelets 08/08/2024 253  150 - 400 K/uL Final   nRBC 08/08/2024 0.0  0.0 - 0.2 % Final   Performed at [facility], [address], [place], [place] [address]  Admission on 07/13/2024, Discharged on 07/13/2024  Component Date Value Ref Range Status   Sodium 07/13/2024 135  135 - 145 mmol/L Final   Potassium 07/13/2024 3.6  3.5 - 5.1 mmol/L Final   Chloride 07/13/2024 98  98 - 111 mmol/L Final   CO2 07/13/2024 25  22 - 32 mmol/L Final   Glucose, Bld 07/13/2024 105 (H)  70 - 99 mg/dL Final   Glucose reference range applies only to samples taken after fasting for at least 8 hours.   BUN 07/13/2024 <5 (L)  6 - 20 mg/dL Final   Creatinine, Ser 07/13/2024 0.50 (L)  0.61 - 1.24 mg/dL Final   Calcium 89/88/7974 8.6 (L)  8.9 - 10.3 mg/dL Final   Total Protein 89/88/7974 7.7  6.5 - 8.1 g/dL Final   Albumin  07/13/2024 3.3 (L)  3.5 - 5.0 g/dL Final   AST 89/88/7974 87 (H)  15 - 41 U/L Final   ALT 07/13/2024 74 (H)  0 - 44 U/L Final   Alkaline Phosphatase 07/13/2024 150 (H)  38 - 126 U/L Final   Total Bilirubin 07/13/2024 0.6  0.0 - 1.2 mg/dL Final   GFR, Estimated 07/13/2024 >60  >60 mL/min Final   Comment: (NOTE) Calculated using the CKD-EPI Creatinine Equation (2021)    Anion gap 07/13/2024 12  5 - 15 Final   Performed at [facility], [address] [place], [place] [address]   Alcohol, Ethyl (B) 07/13/2024 <15  <15 mg/dL Final   Comment: (NOTE) For medical purposes only. Performed at [facility], [address], [place], [place] [address]    WBC 07/13/2024 5.2  4.0 - 10.5 K/uL Final   RBC 07/13/2024  4.92  4.22 - 5.81 MIL/uL Final   Hemoglobin 07/13/2024 12.6 (L)  13.0 - 17.0 g/dL Final   HCT 89/88/7974 39.7  39.0 - 52.0 % Final   MCV 07/13/2024 80.7  80.0 - 100.0 fL Final  MCH 07/13/2024 25.6 (L)  26.0 - 34.0 pg Final   MCHC 07/13/2024 31.7  30.0 - 36.0 g/dL Final   RDW 89/88/7974 18.4 (H)  11.5 - 15.5 % Final   Platelets 07/13/2024 269  150 - 400 K/uL Final   nRBC 07/13/2024 0.0  0.0 - 0.2 % Final   Performed at [facility], [address] [place], [place] [address]   Salicylate Lvl 07/13/2024 <7.0 (L)  7.0 - 30.0 mg/dL Final   Performed at [facility], [address], [place], [place] [address]   Acetaminophen  (Tylenol ), Serum 07/13/2024 <10 (L)  10 - 30 ug/mL Final   Comment: (NOTE) Therapeutic concentrations vary significantly. A range of 10-30 ug/mL  may be an effective concentration for many patients. However, some  are best treated at concentrations outside of this range. Acetaminophen  concentrations >150 ug/mL at 4 hours after ingestion  and >50 ug/mL at 12 hours after ingestion are often associated with  toxic reactions.  Performed at [facility], [address] [place], [place] [address]   Admission on 06/02/2024, Discharged on 06/02/2024  Component Date Value Ref Range Status   Sodium 06/02/2024 134 (L)  135 - 145 mmol/L Final   Potassium 06/02/2024 3.4 (L)  3.5 - 5.1 mmol/L Final   Chloride 06/02/2024 96 (L)  98 - 111 mmol/L Final   CO2 06/02/2024 24  22 - 32 mmol/L Final   Glucose, Bld 06/02/2024 123 (H)  70 - 99 mg/dL Final   Glucose reference range applies only to samples taken after fasting for at least 8 hours.   BUN 06/02/2024 <5 (L)  6 - 20 mg/dL Final   Creatinine, Ser 06/02/2024 0.63  0.61 - 1.24 mg/dL Final   Calcium 91/68/7974 9.0  8.9 - 10.3 mg/dL Final   Total Protein 91/68/7974 8.6 (H)  6.5 - 8.1 g/dL Final   Albumin  06/02/2024 3.7  3.5 - 5.0 g/dL Final   AST 91/68/7974 155 (H)  15 - 41 U/L Final   ALT 06/02/2024 86 (H)  0 -  44 U/L Final   Alkaline Phosphatase 06/02/2024 111  38 - 126 U/L Final   Total Bilirubin 06/02/2024 1.1  0.0 - 1.2 mg/dL Final   GFR, Estimated 06/02/2024 >60  >60 mL/min Final   Comment: (NOTE) Calculated using the CKD-EPI Creatinine Equation (2021)    Anion gap 06/02/2024 14  5 - 15 Final   Performed at [facility], [address] [place], [place] [address]   Alcohol, Ethyl (B) 06/02/2024 <15  <15 mg/dL Final   Comment: (NOTE) For medical purposes only. Performed at [facility], [address] [place], [place] [address]    WBC 06/02/2024 8.7  4.0 - 10.5 K/uL Final   RBC 06/02/2024 4.87  4.22 - 5.81 MIL/uL Final   Hemoglobin 06/02/2024 12.2 (L)  13.0 - 17.0 g/dL Final   HCT 91/68/7974 39.3  39.0 - 52.0 % Final   MCV 06/02/2024 80.7  80.0 - 100.0 fL Final   MCH 06/02/2024 25.1 (L)  26.0 - 34.0 pg Final   MCHC 06/02/2024 31.0  30.0 - 36.0 g/dL Final   RDW 91/68/7974 14.9  11.5 - 15.5 % Final   Platelets 06/02/2024 284  150 - 400 K/uL Final   nRBC 06/02/2024 0.0  0.0 - 0.2 % Final   Performed at [facility], [address] [place], [place] [address]   Opiates 06/02/2024 NONE DETECTED  NONE DETECTED Final   Cocaine  06/02/2024 POSITIVE (A)  NONE DETECTED Final   Benzodiazepines 06/02/2024 POSITIVE (A)  NONE DETECTED Final   Amphetamines 06/02/2024 POSITIVE (A)  NONE DETECTED Final   Comment: (NOTE) Trazodone  is metabolized in vivo to several metabolites, including pharmacologically active m-CPP, which is excreted in the urine. Immunoassay screens for amphetamines and MDMA have potential cross-reactivity with these compounds and may provide false positive  results.     Tetrahydrocannabinol 06/02/2024 POSITIVE (A)  NONE DETECTED Final   Barbiturates 06/02/2024 NONE DETECTED  NONE DETECTED Final   Comment: (NOTE) DRUG SCREEN FOR MEDICAL PURPOSES ONLY.  IF CONFIRMATION IS NEEDED FOR ANY PURPOSE, NOTIFY LAB WITHIN 5 DAYS.  LOWEST DETECTABLE LIMITS FOR  URINE DRUG SCREEN Drug Class                     Cutoff (ng/mL) Amphetamine and metabolites    1000 Barbiturate and metabolites    200 Benzodiazepine                 200 Opiates and metabolites        300 Cocaine  and metabolites        300 THC                            50 Performed at [facility], [address] [place], [place] [address]   Admission on 05/23/2024, Discharged on 05/23/2024  Component Date Value Ref Range Status   Sodium 05/23/2024 137  135 - 145 mmol/L Final   Potassium 05/23/2024 3.3 (L)  3.5 - 5.1 mmol/L Final   Chloride 05/23/2024 101  98 - 111 mmol/L Final   CO2 05/23/2024 27  22 - 32 mmol/L Final   Glucose, Bld 05/23/2024 105 (H)  70 - 99 mg/dL Final   Glucose reference range applies only to samples taken after fasting for at least 8 hours.   BUN 05/23/2024 12  6 - 20 mg/dL Final   Creatinine, Ser 05/23/2024 0.57 (L)  0.61 - 1.24 mg/dL Final   Calcium 91/78/7974 8.5 (L)  8.9 - 10.3 mg/dL Final   Total Protein 91/78/7974 7.6  6.5 - 8.1 g/dL Final   Albumin  05/23/2024 3.4 (L)  3.5 - 5.0 g/dL Final   AST 91/78/7974 63 (H)  15 - 41 U/L Final   ALT 05/23/2024 38  0 - 44 U/L Final   Alkaline Phosphatase 05/23/2024 104  38 - 126 U/L Final   Total Bilirubin 05/23/2024 0.4  0.0 - 1.2 mg/dL Final   GFR, Estimated 05/23/2024 >60  >60 mL/min Final   Comment: (NOTE) Calculated using the CKD-EPI Creatinine Equation (2021)    Anion gap 05/23/2024 9  5 - 15 Final   Performed at [facility], [address], [place], [place] [address]   Alcohol, Ethyl (B) 05/23/2024 <15  <15 mg/dL Final   Comment: (NOTE) For medical purposes only. Performed at [facility], [address], [place], [place] [address]    WBC 05/23/2024 6.0  4.0 - 10.5 K/uL Final   RBC 05/23/2024 4.14 (L)  4.22 - 5.81 MIL/uL Final   Hemoglobin 05/23/2024 10.6 (L)  13.0 - 17.0 g/dL Final   HCT 91/78/7974 34.5 (L)  39.0 - 52.0 % Final   MCV 05/23/2024 83.3   80.0 - 100.0 fL Final   MCH 05/23/2024 25.6 (L)  26.0 - 34.0 pg Final   MCHC  05/23/2024 30.7  30.0 - 36.0 g/dL Final   RDW 91/78/7974 14.7  11.5 - 15.5 % Final   Platelets 05/23/2024 254  150 - 400 K/uL Final   nRBC 05/23/2024 0.0  0.0 - 0.2 % Final   Performed at [facility], [address], [place], [place] [address]  Admission on 05/10/2024, Discharged on 05/12/2024  Component Date Value Ref Range Status   Hgb A1c MFr Bld 05/11/2024 5.6  4.8 - 5.6 % Final   Comment: (NOTE)         Prediabetes: 5.7 - 6.4         Diabetes: >6.4         Glycemic control for adults with diabetes: <7.0    Mean Plasma Glucose 05/11/2024 114  mg/dL Final   Comment: (NOTE) Performed At: [facility] [address] [place], [place] [id number] Jennette Shorter MD Ey:[id number]    Prolactin 05/11/2024 20.5  3.9 - 22.7 ng/mL Final   Comment: (NOTE) Performed At: [facility] [address] [place], [place] [id number] Jennette Shorter MD Ey:[id number]    RPR Ser Ql 05/11/2024 NON REACTIVE  NON REACTIVE Final   Performed at [facility], [address] [place], [place] [address]   Hepatitis B Surface Ag 05/11/2024 NON REACTIVE  NON REACTIVE Final   HCV Ab 05/11/2024 Reactive (A)  NON REACTIVE Final   Comment: (NOTE) The CDC recommends that a Reactive HCV antibody result be followed up  with a HCV Nucleic Acid Amplification test.     Hep A IgM 05/11/2024 NON REACTIVE  NON REACTIVE Final   Hep B C IgM 05/11/2024 NON REACTIVE  NON REACTIVE Final   Performed at [facility], [address] [place], [place] [address]   HIV Screen 4th Generation wRfx 05/11/2024 Non Reactive  Non Reactive Final   Performed at [facility], [address], [place], [place] [address]   TSH 05/11/2024 0.752  0.350 - 4.500 uIU/mL Final   Comment: Performed by a 3rd Generation assay with a functional sensitivity of <=0.01 uIU/mL. Performed at [facility], [address], [place], [place] [address]   Admission on 05/10/2024, Discharged on 05/10/2024  Component Date Value Ref Range Status   WBC 05/10/2024 8.3  4.0 - 10.5 K/uL Final   RBC 05/10/2024 4.14 (L)  4.22 - 5.81 MIL/uL Final   Hemoglobin 05/10/2024 10.9 (L)  13.0 - 17.0 g/dL Final   HCT 91/91/7974 33.7 (L)  39.0 - 52.0 % Final   MCV 05/10/2024 81.4  80.0 - 100.0 fL Final   MCH 05/10/2024 26.3  26.0 - 34.0 pg Final   MCHC 05/10/2024 32.3  30.0 - 36.0 g/dL Final   RDW 91/91/7974 14.1  11.5 - 15.5 % Final   Platelets 05/10/2024 302  150 - 400 K/uL Final   nRBC 05/10/2024 0.0  0.0 - 0.2 % Final   Neutrophils Relative % 05/10/2024 51  % Final   Neutro Abs 05/10/2024 4.2  1.7 - 7.7 K/uL Final   Lymphocytes Relative 05/10/2024 37  % Final   Lymphs Abs 05/10/2024 3.1  0.7 - 4.0 K/uL Final   Monocytes Relative 05/10/2024 10  % Final   Monocytes Absolute 05/10/2024 0.8  0.1 - 1.0 K/uL Final   Eosinophils Relative 05/10/2024 1  % Final   Eosinophils Absolute 05/10/2024 0.1  0.0 - 0.5 K/uL Final   Basophils Relative 05/10/2024 1  % Final   Basophils Absolute  05/10/2024 0.1  0.0 - 0.1 K/uL Final   Immature Granulocytes 05/10/2024 0  % Final   Abs Immature Granulocytes 05/10/2024 0.03  0.00 - 0.07 K/uL Final   Performed at [facility], [address], [place], [place] [address]   Sodium 05/10/2024 138  135 - 145 mmol/L Final   Potassium 05/10/2024 3.9  3.5 - 5.1 mmol/L Final   Chloride 05/10/2024 101  98 - 111 mmol/L Final   CO2 05/10/2024 24  22 - 32 mmol/L Final   Glucose, Bld 05/10/2024 83  70 - 99 mg/dL Final   Glucose reference range applies only to samples taken after fasting for at least 8 hours.   BUN 05/10/2024 <5 (L)  6 - 20 mg/dL Final   Creatinine, Ser 05/10/2024 0.57 (L)  0.61 - 1.24 mg/dL Final   Calcium 91/91/7974 8.8 (L)  8.9 - 10.3 mg/dL Final   Total Protein 91/91/7974 7.7  6.5 - 8.1 g/dL Final   Albumin  05/10/2024 3.5  3.5 - 5.0 g/dL Final   AST 91/91/7974 87 (H)  15 - 41 U/L  Final   ALT 05/10/2024 80 (H)  0 - 44 U/L Final   Alkaline Phosphatase 05/10/2024 121  38 - 126 U/L Final   Total Bilirubin 05/10/2024 0.4  0.0 - 1.2 mg/dL Final   GFR, Estimated 05/10/2024 >60  >60 mL/min Final   Comment: (NOTE) Calculated using the CKD-EPI Creatinine Equation (2021)    Anion gap 05/10/2024 13  5 - 15 Final   Performed at [facility], [address], [place], [place] [address]   Alcohol, Ethyl (B) 05/10/2024 126 (H)  <15 mg/dL Final   Comment: (NOTE) For medical purposes only. Performed at [facility], [address], [place], [place] [address]    Cholesterol 05/10/2024 125  0 - 200 mg/dL Final   Triglycerides 91/91/7974 49  <150 mg/dL Final   HDL 91/91/7974 48  >40 mg/dL Final   Total CHOL/HDL Ratio 05/10/2024 2.6  RATIO Final   VLDL 05/10/2024 10  0 - 40 mg/dL Final   LDL Cholesterol 05/10/2024 67  0 - 99 mg/dL Final   Comment:        Total Cholesterol/HDL:CHD Risk Coronary Heart Disease Risk Table                     Men   Women  1/2 Average Risk   3.4   3.3  Average Risk       5.0   4.4  2 X Average Risk   9.6   7.1  3 X Average Risk  23.4   11.0        Use the calculated Patient Ratio above and the CHD Risk Table to determine the patient's CHD Risk.        ATP III CLASSIFICATION (LDL):  <100     mg/dL   Optimal  899-870  mg/dL   Near or Above                    Optimal  130-159  mg/dL   Borderline  839-810  mg/dL   High  >809     mg/dL   Very High Performed at [facility], [address] [place], [place] [address]    POC Amphetamine UR 05/10/2024 None Detected  NONE DETECTED (Cut Off Level 1000 ng/mL) Final   POC Secobarbital (BAR) 05/10/2024 None Detected  NONE DETECTED (Cut Off  Level 300 ng/mL) Final   POC Buprenorphine (BUP) 05/10/2024 None Detected  NONE DETECTED (Cut Off Level 10 ng/mL) Final   POC Oxazepam (BZO) 05/10/2024 None Detected  NONE DETECTED (Cut Off Level 300 ng/mL) Final   POC Cocaine  UR 05/10/2024 Positive  (A)  NONE DETECTED (Cut Off Level 300 ng/mL) Final   POC Methamphetamine UR 05/10/2024 Positive (A)  NONE DETECTED (Cut Off Level 1000 ng/mL) Final   POC Morphine  05/10/2024 None Detected  NONE DETECTED (Cut Off Level 300 ng/mL) Final   POC Methadone UR 05/10/2024 None Detected  NONE DETECTED (Cut Off Level 300 ng/mL) Final   POC Oxycodone  UR 05/10/2024 None Detected  NONE DETECTED (Cut Off Level 100 ng/mL) Final   POC Marijuana UR 05/10/2024 None Detected  NONE DETECTED (Cut Off Level 50 ng/mL) Final  Admission on 05/09/2024, Discharged on 05/09/2024  Component Date Value Ref Range Status   Glucose-Capillary 05/09/2024 122 (H)  70 - 99 mg/dL Final   Glucose reference range applies only to samples taken after fasting for at least 8 hours.  Admission on 04/27/2024, Discharged on 04/28/2024  Component Date Value Ref Range Status   WBC 04/27/2024 8.4  4.0 - 10.5 K/uL Final   RBC 04/27/2024 3.91 (L)  4.22 - 5.81 MIL/uL Final   Hemoglobin 04/27/2024 10.3 (L)  13.0 - 17.0 g/dL Final   HCT 92/73/7974 32.3 (L)  39.0 - 52.0 % Final   MCV 04/27/2024 82.6  80.0 - 100.0 fL Final   MCH 04/27/2024 26.3  26.0 - 34.0 pg Final   MCHC 04/27/2024 31.9  30.0 - 36.0 g/dL Final   RDW 92/73/7974 13.5  11.5 - 15.5 % Final   Platelets 04/27/2024 299  150 - 400 K/uL Final   nRBC 04/27/2024 0.0  0.0 - 0.2 % Final   Neutrophils Relative % 04/27/2024 63  % Final   Neutro Abs 04/27/2024 5.4  1.7 - 7.7 K/uL Final   Lymphocytes Relative 04/27/2024 22  % Final   Lymphs Abs 04/27/2024 1.8  0.7 - 4.0 K/uL Final   Monocytes Relative 04/27/2024 13  % Final   Monocytes Absolute 04/27/2024 1.1 (H)  0.1 - 1.0 K/uL Final   Eosinophils Relative 04/27/2024 0  % Final   Eosinophils Absolute 04/27/2024 0.0  0.0 - 0.5 K/uL Final   Basophils Relative 04/27/2024 1  % Final   Basophils Absolute 04/27/2024 0.1  0.0 - 0.1 K/uL Final   Immature Granulocytes 04/27/2024 1  % Final   Abs Immature Granulocytes 04/27/2024 0.04  0.00 - 0.07  K/uL Final   Performed at [facility], [address], [place], [place] [address]   Sodium 04/27/2024 132 (L)  135 - 145 mmol/L Final   Potassium 04/27/2024 3.6  3.5 - 5.1 mmol/L Final   Chloride 04/27/2024 98  98 - 111 mmol/L Final   CO2 04/27/2024 25  22 - 32 mmol/L Final   Glucose, Bld 04/27/2024 128 (H)  70 - 99 mg/dL Final   Glucose reference range applies only to samples taken after fasting for at least 8 hours.   BUN 04/27/2024 13  6 - 20 mg/dL Final   Creatinine, Ser 04/27/2024 0.52 (L)  0.61 - 1.24 mg/dL Final   Calcium 92/73/7974 8.9  8.9 - 10.3 mg/dL Final   GFR, Estimated 04/27/2024 >60  >60 mL/min Final   Comment: (NOTE) Calculated using the CKD-EPI Creatinine Equation (2021)    Anion gap 04/27/2024 9  5 - 15 Final  Performed at [facility], [address], [place], [place] [address]   Alcohol, Ethyl (B) 04/27/2024 <15  <15 mg/dL Final   Comment: (NOTE) For medical purposes only. Performed at [facility], [address], [address] [place], [place] [address]    Opiates 04/27/2024 POSITIVE (A)  NONE DETECTED Final   Cocaine  04/27/2024 POSITIVE (A)  NONE DETECTED Final   Benzodiazepines 04/27/2024 NONE DETECTED  NONE DETECTED Final   Amphetamines 04/27/2024 NONE DETECTED  NONE DETECTED Final   Tetrahydrocannabinol 04/27/2024 POSITIVE (A)  NONE DETECTED Final   Barbiturates 04/27/2024 NONE DETECTED  NONE DETECTED Final   Comment: (NOTE) DRUG SCREEN FOR MEDICAL PURPOSES ONLY.  IF CONFIRMATION IS NEEDED FOR ANY PURPOSE, NOTIFY LAB WITHIN 5 DAYS.  LOWEST DETECTABLE LIMITS FOR URINE DRUG SCREEN Drug Class                     Cutoff (ng/mL) Amphetamine and metabolites    1000 Barbiturate and metabolites    200 Benzodiazepine                 200 Opiates and metabolites        300 Cocaine  and metabolites        300 THC                            50 Performed at [facility], [address], [place], [place] [address]   There may be more visits with results that are not included.    Blood Alcohol level:  Lab Results  Component Value Date   ETH <15 08/26/2024   ETH 35 (H) 08/08/2024    Metabolic Disorder Labs: Lab Results  Component Value Date   HGBA1C 5.6 08/26/2024   MPG 114 08/26/2024   MPG 114 05/11/2024   Lab Results  Component Value Date   PROLACTIN 20.5 05/11/2024   PROLACTIN 31.4 (H) 11/16/2019   Lab Results  Component Value Date   CHOL 125 05/10/2024   TRIG 49 05/10/2024   HDL 48 05/10/2024   CHOLHDL 2.6 05/10/2024   VLDL 10 05/10/2024   LDLCALC 67 05/10/2024   LDLCALC 51 12/08/2023    Therapeutic Lab Levels: No results found for: LITHIUM No results found for: VALPROATE No results found for: CBMZ  Physical Findings   AIMS    Flowsheet Row [facility]) [facility]) [facility] ([facility]) [facility]  AIMS Total Score 0 0 0   AUDIT    Flowsheet Row ED from 05/10/2024 in [facility] Admission (Discharged) [facility] (Discharged) [facility] Ad[facility] (Discharged) from OP Visit [facility] (Dis[facility]) [facility]  Alcohol Use Disorder Identification Test Final Score (AUDIT) 31 26 26 13  37   PHQ2-9    Flowsheet Row ED from 08/26/2024 in [facility] ED from 05/10/2024 in [facility] ED from 01/14/2024 in [facility] ED from 12/08/2023 in [facility] ED from 11/21/2023 in [facility]  PHQ-2 Total Score 2 4 0 0 2  PHQ-9 Total  Score 9 12 14  0 8   Flowsheet Row ED from 08/26/2024 in [facility] Most recent reading at 08/26/2024 12:46 PM ED from 08/26/2024 in [facility] Most recent reading at 08/26/2024  8:19 AM ED from 08/14/2024 in [facility] Most recent reading at 08/14/2024  9:25 AM  C-SSRS RISK CATEGORY Moderate Risk Error: Q3, 4, or 5 should not be populated when Q2 is No Error: Q7 should not be populated when Q6 is No     Psychiatric Specialty Exam: Physical Exam Constitutional:      Appearance: the patient is not toxic-appearing.  Pulmonary:     Effort: Pulmonary effort is normal.  Neurological:     General: No focal deficit present.     Mental Status: the patient is alert and oriented to person, place, and time.   Review of Systems  Respiratory:  Negative for shortness of breath.   Cardiovascular:  Negative for chest pain.  Gastrointestinal:  Negative for abdominal pain, constipation, diarrhea, nausea and vomiting.  Neurological:  Negative for headaches.      BP (!) 100/57 (BP Location: Left Arm)   Pulse 64   Temp 97.8 F (36.6 C) (Oral)   Resp 16   SpO2 98%   General Appearance: Fairly Groomed  Eye Contact:  Good  Speech:  Clear and Coherent  Volume:  Normal  Mood:  Euthymic  Affect:  Congruent  Thought Process:  Coherent  Orientation:  Full (Time, Place, and Person)  Thought Content: Logical   Suicidal Thoughts:  No  Homicidal Thoughts:  No  Memory:  Immediate;   Good  Judgement:  fair  Insight:  fair  Psychomotor Activity:  Normal  Concentration:  Concentration: Good  Recall:  Good  Fund of Knowledge: Good  Language: Good  Akathisia:  No  Handed:  not assessed  AIMS (if indicated): not done  Assets:  Communication Skills Desire for Improvement Leisure Time Physical Health  ADL's:  Intact  Cognition: WNL       Treatment Plan Summary: Daily contact with patient to assess and evaluate symptoms and progress in treatment and Medication management.  Status: Voluntary   Alcohol use disorder, opioid use disorder, stimulant use disorder, currently in withdrawal; history schizoaffective disorder - CIWA with as needed Ativan  - Comfort as needed medication - Can consider Ativan  taper should this be necessary - Seroquel  50 mg twice daily targeting auditory hallucinations, anxiety, and insomnia - Will follow-up with CD IOP on 12/3     Labs and EKG reviewed, unremarkable.  LFT elevation, likely hep C versus alcoholic hepatitis Patient reports productive cough and right-sided chest pain.  Afebrile, no leukocytosis.  Will continue to monitor.    Karleen Kaufmann, MD 08/27/2024 4:19 PM

## 2024-08-27 NOTE — Discharge Instructions (Addendum)
 [facility] Care Management...  Based on the information that you have provided and the presenting issues outpatient services and resources for have been recommended.  It is imperative that you follow through with treatment recommendations within 5-7 days from the of discharge to mitigate further risk to your safety and mental well-being. A list of referrals has been provided below to get you started.  You are not limited to the list provided.  In case of an urgent crisis, you may contact the Mobile Crisis Unit with [facility] at [phone].  Writer coordinated an intake appointment for the patient on Wednesday, December 3rd, at 8:30 a.m at...  [facility] [address] on the 2nd floor  [place].   Phone number [phone]   [facility]ional [facility]IOP Program in [facility] [address] [place], [place] [address]  [phone]    [facility] [address] [place], [place] [address]  [phone]    [facility] [address]  [place], [place] [address] [phone]    [facility] [address]  [place], [place] [address] [phone]    [facility]  [address] [place] , [address] [phone]   [facility] [address] [place], [place]  [address] [phone] [facility] Pay only)   [facility] [address] [place], [name] , [address] [phone]   [facility] [address] [place], [place] , [address] [phone]   [facility] [address] [place], [place] , [address] [phone]

## 2024-08-27 NOTE — Care Management (Addendum)
 [facility] Care Management  Writer and NT met with the client to discuss discharge planning.  Client declined residential treatment. Client reports he only wants to detox.   Writer explored [facility] and he reports he's interested.  Writer sent secured chat to [facility]IOP to check for eligibility and to arrange follow up appointments.    UPDATE:  Client has an appointment scheduled with [facility] for 09/04/24 at 8:30am.  Client was provided with the written detals for his appointment time.

## 2024-08-27 NOTE — Group Note (Signed)
 Group Topic: Positive Affirmations  Group Date: 08/27/2024 Start Time: 1700 End Time: 1730 Facilitators: Elnor Keven SAILOR  Department: [facility]  Number of Participants: 7  Group Focus: affirmation Treatment Modality:  Psychoeducation Interventions utilized were support Purpose: reinforce self-care  Name: Mark Hammond Date of Birth: 01-12-1991  MR: [id number]    Level of Participation: minimal Quality of Participation: attentive Interactions with others: positive Mood/Affect: appropriate Triggers (if applicable): NA Cognition: coherent/clear Progress: Moderate Response: Discussed what affirmations are and why they can be useful. Discussed the importance of positive self-concept and shared examples of positive self affirmations.  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   Polysubstance dependence (HCC) 12/13/2023   Polysubstance abuse (HCC) 11/21/2023   Alcohol use disorder 08/04/2023   Stimulant use disorder 08/04/2023   Tobacco use disorder 08/04/2023   Cannabis use disorder 08/04/2023   Hepatitis C 03/01/2023   Anxiety state 01/14/2023   Insomnia 01/14/2023   Schizoaffective disorder (HCC) 01/13/2023   Schizophrenia (HCC) 11/15/2019   Substance induced mood disorder (HCC) 10/24/2019   Methamphetamine use disorder, 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

## 2024-08-27 NOTE — Group Note (Signed)
 Group Topic: Social Support  Group Date: 08/27/2024 Start Time: 2000 End Time: 2030 Facilitators: Joan Plowman B  Department: [facility]  Number of Participants: 1  Group Focus: goals/reality orientation Treatment Modality:  Psychoeducation Interventions utilized were leisure development Purpose: express feelings  Name: Mark Hammond Date of Birth: 04-Nov-1990  MR: [id number]    Level of Participation: active Quality of Participation: cooperative Interactions with others: gave feedback Mood/Affect: appropriate Triggers (if applicable): NA Cognition: coherent/clear Progress: Gaining insight Response: NA Plan: patient will be encouraged to keep going to groups  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   Polysubstance dependence (HCC) 12/13/2023   Polysubstance abuse (HCC) 11/21/2023   Alcohol use disorder 08/04/2023   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

## 2024-08-27 NOTE — ED Notes (Signed)
 Patient alert & oriented x4. Denies intent to harm self or others when asked. Denies A/VH. Patient reports pain 7/10 at this time, PRN Tylenol  administered alongside scheduled medications. No acute distress noted. Scheduled medications administered with no complications. Support and encouragement provided. Patient observed in milieu. No inappropriate behaviors observed or reported. Routine safety checks conducted per facility protocol. Encouraged patient to notify staff if any thoughts of harm towards self or others arise. Patient verbalizes understanding and agreement.

## 2024-08-27 NOTE — ED Notes (Signed)
Patient sleeping with no s/s of distress.

## 2024-08-27 NOTE — ED Notes (Signed)
Pt is sleeping now. No acute distress noted.

## 2024-08-28 DIAGNOSIS — F10239 Alcohol dependence with withdrawal, unspecified: Secondary | ICD-10-CM | POA: Diagnosis not present

## 2024-08-28 DIAGNOSIS — F1220 Cannabis dependence, uncomplicated: Secondary | ICD-10-CM | POA: Diagnosis not present

## 2024-08-28 DIAGNOSIS — F1123 Opioid dependence with withdrawal: Secondary | ICD-10-CM | POA: Diagnosis not present

## 2024-08-28 DIAGNOSIS — F1523 Other stimulant dependence with withdrawal: Secondary | ICD-10-CM | POA: Diagnosis not present

## 2024-08-28 NOTE — ED Notes (Signed)
 Patient is in the bedroom calm and sleeping.  NAD. Respirations even and unlabored. Will monitor for safety.

## 2024-08-28 NOTE — ED Provider Notes (Signed)
 Behavioral Health Progress Note  Date and Time: 08/28/2024 1:28 PM Name: Mark Hammond MRN:  [id number]  Subjective:   The patient is a 33 year old male with a long history of multiple substance use disorders (opioid, stimulant, alcohol) who has been seen at this facility numerous times. On the present occasion he presented to the [facility] requesting detox from substances. He was admitted to the facility based crisis.   The patient is pleasant today. He is able to discuss his discharge plan in detail: plans to stay at his uncle's house and attend [facility] initial eval on 12/3. Agreeable to discharge tomorrow morning.   The patient denies auditory/visual hallucinations.  The patient reports good mood, appetite, and sleep. They deny suicidal and homicidal thoughts. The patient denies side effects from their medications.  Review of systems as below. The patient denies experiencing any withdrawal symptoms.   Diagnosis:  Final diagnoses:  Opioid use with withdrawal (HCC)  Alcohol use disorder, severe, in controlled environment (HCC)  Moderate tetrahydrocannabinol (THC) dependence (HCC)  Auditory hallucinations  Stimulant use disorder  Elevated liver enzymes  Substance induced mood disorder (HCC)    Total Time spent with patient: 15 minutes  Past Psychiatric History: See H&P and above Past Medical History: See H&P and above Family History: See H&P and above Family Psychiatric  History: See H&P and above Social History: See H&P and above  Additional Social History:  none  Sleep: fair  Appetite:  fair  Current Medications:  Current Facility-Administered Medications  Medication Dose Route Frequency Provider Last Rate Last Admin   acetaminophen  (TYLENOL ) tablet 650 mg  650 mg Oral Q6H PRN Marry Clamp, MD   650 mg at 08/28/24 0919   alum & mag hydroxide-simeth (MAALOX/MYLANTA) 200-200-20 MG/5ML suspension 30 mL  30 mL Oral Q4H PRN Marry Clamp, MD        haloperidol  (HALDOL ) tablet 5 mg  5 mg Oral TID PRN Marry Clamp, MD       And   diphenhydrAMINE  (BENADRYL ) capsule 50 mg  50 mg Oral TID PRN Marry Clamp, MD       haloperidol  lactate (HALDOL ) injection 5 mg  5 mg Intramuscular TID PRN Marry Clamp, MD       And   diphenhydrAMINE  (BENADRYL ) injection 50 mg  50 mg Intramuscular TID PRN Marry Clamp, MD       And   LORazepam  (ATIVAN ) injection 2 mg  2 mg Intramuscular TID PRN Marry Clamp, MD       haloperidol  lactate (HALDOL ) injection 10 mg  10 mg Intramuscular TID PRN Marry Clamp, MD       And   diphenhydrAMINE  (BENADRYL ) injection 50 mg  50 mg Intramuscular TID PRN Marry Clamp, MD       And   LORazepam  (ATIVAN ) injection 2 mg  2 mg Intramuscular TID PRN Marry Clamp, MD       hydrOXYzine  (ATARAX ) tablet 25 mg  25 mg Oral TID PRN Marry Clamp, MD       loperamide  (IMODIUM ) capsule 2-4 mg  2-4 mg Oral PRN Marry Clamp, MD       LORazepam  (ATIVAN ) tablet 1 mg  1 mg Oral Q4H PRN Marry Clamp, MD       magnesium  hydroxide (MILK OF MAGNESIA) suspension 30 mL  30 mL Oral Daily PRN Marry Clamp, MD       multivitamin with minerals tablet 1 tablet  1 tablet Oral Daily Marry Clamp, MD   1 tablet at  08/28/24 0919   nicotine  (NICODERM CQ  - dosed in mg/24 hours) patch 21 mg  21 mg Transdermal Q0600 Marry Clamp, MD       ondansetron  (ZOFRAN -ODT) disintegrating tablet 4 mg  4 mg Oral Q6H PRN Marry Clamp, MD       QUEtiapine  (SEROQUEL ) tablet 50 mg  50 mg Oral BID Marry Clamp, MD   50 mg at 08/28/24 9081   Current Outpatient Medications  Medication Sig Dispense Refill   ARIPiprazole  (ABILIFY ) 10 MG tablet Take 10 mg by mouth daily.     gabapentin  (NEURONTIN ) 300 MG capsule Take 300 mg by mouth 3 (three) times daily.     QUEtiapine  (SEROQUEL ) 50 MG tablet Take 50 mg by mouth at bedtime. (Patient taking differently: Take 50 mg by mouth at bedtime as needed (Sleep).)     hydrOXYzine  (ATARAX ) 25 MG  tablet Take 25 mg by mouth every 6 (six) hours as needed for anxiety. (Patient not taking: Reported on 05/23/2024)     naproxen  (NAPROSYN ) 375 MG tablet Take 1 tablet (375 mg total) by mouth 2 (two) times daily. (Patient not taking: Reported on 05/23/2024) 20 tablet 0   sertraline  (ZOLOFT ) 50 MG tablet Take 50 mg by mouth daily. (Patient not taking: Reported on 08/26/2024)      Labs  Lab Results:  Admission on 08/26/2024  Component Date Value Ref Range Status   Hgb A1c MFr Bld 08/26/2024 5.6  4.8 - 5.6 % Final   Comment: (NOTE)         Prediabetes: 5.7 - 6.4         Diabetes: >6.4         Glycemic control for adults with diabetes: <7.0    Mean Plasma Glucose 08/26/2024 114  mg/dL Final   Comment: (NOTE) Performed At: [facility] [address] [place], [place] [id number] Jennette Shorter MD Ey:[phone]   Admission on 08/26/2024, Discharged on 08/26/2024  Component Date Value Ref Range Status   POC Amphetamine UR 08/26/2024 None Detected  NONE DETECTED (Cut Off Level 1000 ng/mL) Final   POC Secobarbital (BAR) 08/26/2024 None Detected  NONE DETECTED (Cut Off Level 300 ng/mL) Final   POC Buprenorphine (BUP) 08/26/2024 None Detected  NONE DETECTED (Cut Off Level 10 ng/mL) Final   POC Oxazepam (BZO) 08/26/2024 Positive (A)  NONE DETECTED (Cut Off Level 300 ng/mL) Final   POC Cocaine  UR 08/26/2024 Positive (A)  NONE DETECTED (Cut Off Level 300 ng/mL) Final   POC Methamphetamine UR 08/26/2024 None Detected  NONE DETECTED (Cut Off Level 1000 ng/mL) Final   POC Morphine  08/26/2024 Positive (A)  NONE DETECTED (Cut Off Level 300 ng/mL) Final   POC Methadone UR 08/26/2024 Positive (A)  NONE DETECTED (Cut Off Level 300 ng/mL) Final   POC Oxycodone  UR 08/26/2024 None Detected  NONE DETECTED (Cut Off Level 100 ng/mL) Final   POC Marijuana UR 08/26/2024 Positive (A)  NONE DETECTED (Cut Off Level 50 ng/mL) Final   Sodium 08/26/2024 139  135 - 145 mmol/L Final   Potassium 08/26/2024 4.5  3.5 -  5.1 mmol/L Final   Chloride 08/26/2024 102  98 - 111 mmol/L Final   CO2 08/26/2024 27  22 - 32 mmol/L Final   Glucose, Bld 08/26/2024 84  70 - 99 mg/dL Final   Glucose reference range applies only to samples taken after fasting for at least 8 hours.   BUN 08/26/2024 6  6 - 20 mg/dL Final   Creatinine, Ser 08/26/2024 0.63  0.61 -  1.24 mg/dL Final   Calcium 88/75/7974 9.3  8.9 - 10.3 mg/dL Final   Total Protein 88/75/7974 7.8  6.5 - 8.1 g/dL Final   Albumin  08/26/2024 3.6  3.5 - 5.0 g/dL Final   AST 88/75/7974 132 (H)  15 - 41 U/L Final   ALT 08/26/2024 120 (H)  0 - 44 U/L Final   Alkaline Phosphatase 08/26/2024 138 (H)  38 - 126 U/L Final   Total Bilirubin 08/26/2024 0.7  0.0 - 1.2 mg/dL Final   GFR, Estimated 08/26/2024 >60  >60 mL/min Final   Comment: (NOTE) Calculated using the CKD-EPI Creatinine Equation (2021)    Anion gap 08/26/2024 10  5 - 15 Final   Performed at [facility], [address], [place], [place] [address]   WBC 08/26/2024 5.9  4.0 - 10.5 K/uL Final   RBC 08/26/2024 5.14  4.22 - 5.81 MIL/uL Final   Hemoglobin 08/26/2024 13.3  13.0 - 17.0 g/dL Final   HCT 88/75/7974 41.9  39.0 - 52.0 % Final   MCV 08/26/2024 81.5  80.0 - 100.0 fL Final   MCH 08/26/2024 25.9 (L)  26.0 - 34.0 pg Final   MCHC 08/26/2024 31.7  30.0 - 36.0 g/dL Final   RDW 88/75/7974 18.6 (H)  11.5 - 15.5 % Final   Platelets 08/26/2024 233  150 - 400 K/uL Final   nRBC 08/26/2024 0.0  0.0 - 0.2 % Final   Neutrophils Relative % 08/26/2024 64  % Final   Neutro Abs 08/26/2024 3.8  1.7 - 7.7 K/uL Final   Lymphocytes Relative 08/26/2024 23  % Final   Lymphs Abs 08/26/2024 1.4  0.7 - 4.0 K/uL Final   Monocytes Relative 08/26/2024 11  % Final   Monocytes Absolute 08/26/2024 0.7  0.1 - 1.0 K/uL Final   Eosinophils Relative 08/26/2024 1  % Final   Eosinophils Absolute 08/26/2024 0.0  0.0 - 0.5 K/uL Final   Basophils Relative 08/26/2024 1  % Final   Basophils Absolute 08/26/2024 0.1  0.0 - 0.1 K/uL  Final   Immature Granulocytes 08/26/2024 0  % Final   Abs Immature Granulocytes 08/26/2024 0.02  0.00 - 0.07 K/uL Final   Performed at [facility], [address] [place], [place] [address]   Alcohol, Ethyl (B) 08/26/2024 <15  <15 mg/dL Final   Comment: (NOTE) For medical purposes only. Performed at [facility], [address] [place], [place] [address]   Admission on 08/08/2024, Discharged on 08/08/2024  Component Date Value Ref Range Status   Sodium 08/08/2024 138  135 - 145 mmol/L Final   Potassium 08/08/2024 4.0  3.5 - 5.1 mmol/L Final   Chloride 08/08/2024 99  98 - 111 mmol/L Final   CO2 08/08/2024 24  22 - 32 mmol/L Final   Glucose, Bld 08/08/2024 67 (L)  70 - 99 mg/dL Final   Glucose reference range applies only to samples taken after fasting for at least 8 hours.   BUN 08/08/2024 10  6 - 20 mg/dL Final   Creatinine, Ser 08/08/2024 0.57 (L)  0.61 - 1.24 mg/dL Final   Calcium 88/93/7974 9.0  8.9 - 10.3 mg/dL Final   Total Protein 88/93/7974 8.2 (H)  6.5 - 8.1 g/dL Final   Albumin  08/08/2024 4.1  3.5 - 5.0 g/dL Final   AST 88/93/7974 188 (H)  15 - 41 U/L Final   ALT 08/08/2024 107 (H)  0 - 44 U/L Final   Alkaline Phosphatase 08/08/2024 160 (  H)  38 - 126 U/L Final   Total Bilirubin 08/08/2024 0.5  0.0 - 1.2 mg/dL Final   GFR, Estimated 08/08/2024 >60  >60 mL/min Final   Comment: (NOTE) Calculated using the CKD-EPI Creatinine Equation (2021)    Anion gap 08/08/2024 15  5 - 15 Final   Performed at [facility], [address] [place], [place] [address]   Alcohol, Ethyl (B) 08/08/2024 35 (H)  <15 mg/dL Final   Comment: (NOTE) For medical purposes only. Performed at [facility], [address] [place], [place] [address]    WBC 08/08/2024 4.6  4.0 - 10.5 K/uL Final   RBC 08/08/2024 5.08  4.22 - 5.81 MIL/uL Final   Hemoglobin 08/08/2024 13.0  13.0 - 17.0 g/dL Final   HCT 88/93/7974 41.8  39.0 - 52.0 % Final   MCV  08/08/2024 82.3  80.0 - 100.0 fL Final   MCH 08/08/2024 25.6 (L)  26.0 - 34.0 pg Final   MCHC 08/08/2024 31.1  30.0 - 36.0 g/dL Final   RDW 88/93/7974 18.8 (H)  11.5 - 15.5 % Final   Platelets 08/08/2024 253  150 - 400 K/uL Final   nRBC 08/08/2024 0.0  0.0 - 0.2 % Final   Performed at [facility], [address], [place], [place] [address]  Admission on 07/13/2024, Discharged on 07/13/2024  Component Date Value Ref Range Status   Sodium 07/13/2024 135  135 - 145 mmol/L Final   Potassium 07/13/2024 3.6  3.5 - 5.1 mmol/L Final   Chloride 07/13/2024 98  98 - 111 mmol/L Final   CO2 07/13/2024 25  22 - 32 mmol/L Final   Glucose, Bld 07/13/2024 105 (H)  70 - 99 mg/dL Final   Glucose reference range applies only to samples taken after fasting for at least 8 hours.   BUN 07/13/2024 <5 (L)  6 - 20 mg/dL Final   Creatinine, Ser 07/13/2024 0.50 (L)  0.61 - 1.24 mg/dL Final   Calcium 89/88/7974 8.6 (L)  8.9 - 10.3 mg/dL Final   Total Protein 89/88/7974 7.7  6.5 - 8.1 g/dL Final   Albumin  07/13/2024 3.3 (L)  3.5 - 5.0 g/dL Final   AST 89/88/7974 87 (H)  15 - 41 U/L Final   ALT 07/13/2024 74 (H)  0 - 44 U/L Final   Alkaline Phosphatase 07/13/2024 150 (H)  38 - 126 U/L Final   Total Bilirubin 07/13/2024 0.6  0.0 - 1.2 mg/dL Final   GFR, Estimated 07/13/2024 >60  >60 mL/min Final   Comment: (NOTE) Calculated using the CKD-EPI Creatinine Equation (2021)    Anion gap 07/13/2024 12  5 - 15 Final   Performed at [facility], [address], [place], [place] [address]   Alcohol, Ethyl (B) 07/13/2024 <15  <15 mg/dL Final   Comment: (NOTE) For medical purposes only. Performed at [facility], [address] [place], [place] [address]    WBC 07/13/2024 5.2  4.0 - 10.5 K/uL Final   RBC 07/13/2024 4.92  4.22 - 5.81 MIL/uL Final   Hemoglobin 07/13/2024 12.6 (L)  13.0 - 17.0 g/dL Final   HCT 89/88/7974 39.7  39.0 - 52.0 % Final   MCV 07/13/2024 80.7  80.0 - 100.0 fL Final    MCH 07/13/2024 25.6 (L)  26.0 - 34.0 pg Final   MCHC 07/13/2024 31.7  30.0 - 36.0 g/dL Final   RDW 89/88/7974 18.4 (H)  11.5 - 15.5 % Final  Platelets 07/13/2024 269  150 - 400 K/uL Final   nRBC 07/13/2024 0.0  0.0 - 0.2 % Final   Performed at [facility], [address], [place], [place] [address]   Salicylate Lvl 07/13/2024 <7.0 (L)  7.0 - 30.0 mg/dL Final   Performed at [facility], [address], [place], [place] [address]   Acetaminophen  (Tylenol ), Serum 07/13/2024 <10 (L)  10 - 30 ug/mL Final   Comment: (NOTE) Therapeutic concentrations vary significantly. A range of 10-30 ug/mL  may be an effective concentration for many patients. However, some  are best treated at concentrations outside of this range. Acetaminophen  concentrations >150 ug/mL at 4 hours after ingestion  and >50 ug/mL at 12 hours after ingestion are often associated with  toxic reactions.  Performed at [facility], [address] [place], [place] [address]   Admission on 06/02/2024, Discharged on 06/02/2024  Component Date Value Ref Range Status   Sodium 06/02/2024 134 (L)  135 - 145 mmol/L Final   Potassium 06/02/2024 3.4 (L)  3.5 - 5.1 mmol/L Final   Chloride 06/02/2024 96 (L)  98 - 111 mmol/L Final   CO2 06/02/2024 24  22 - 32 mmol/L Final   Glucose, Bld 06/02/2024 123 (H)  70 - 99 mg/dL Final   Glucose reference range applies only to samples taken after fasting for at least 8 hours.   BUN 06/02/2024 <5 (L)  6 - 20 mg/dL Final   Creatinine, Ser 06/02/2024 0.63  0.61 - 1.24 mg/dL Final   Calcium 91/68/7974 9.0  8.9 - 10.3 mg/dL Final   Total Protein 91/68/7974 8.6 (H)  6.5 - 8.1 g/dL Final   Albumin  06/02/2024 3.7  3.5 - 5.0 g/dL Final   AST 91/68/7974 155 (H)  15 - 41 U/L Final   ALT 06/02/2024 86 (H)  0 - 44 U/L Final   Alkaline Phosphatase 06/02/2024 111  38 - 126 U/L Final   Total Bilirubin 06/02/2024 1.1  0.0 - 1.2 mg/dL Final   GFR, Estimated 06/02/2024 >60  >60 mL/min  Final   Comment: (NOTE) Calculated using the CKD-EPI Creatinine Equation (2021)    Anion gap 06/02/2024 14  5 - 15 Final   Performed at [facility], [address] [place], [place] [address]   Alcohol, Ethyl (B) 06/02/2024 <15  <15 mg/dL Final   Comment: (NOTE) For medical purposes only. Performed at [facility], [address], [place], [place] [address]    WBC 06/02/2024 8.7  4.0 - 10.5 K/uL Final   RBC 06/02/2024 4.87  4.22 - 5.81 MIL/uL Final   Hemoglobin 06/02/2024 12.2 (L)  13.0 - 17.0 g/dL Final   HCT 91/68/7974 39.3  39.0 - 52.0 % Final   MCV 06/02/2024 80.7  80.0 - 100.0 fL Final   MCH 06/02/2024 25.1 (L)  26.0 - 34.0 pg Final   MCHC 06/02/2024 31.0  30.0 - 36.0 g/dL Final   RDW 91/68/7974 14.9  11.5 - 15.5 % Final   Platelets 06/02/2024 284  150 - 400 K/uL Final   nRBC 06/02/2024 0.0  0.0 - 0.2 % Final   Performed at [facility], [address], [place], [place] [address]   Opiates 06/02/2024 NONE DETECTED  NONE DETECTED Final   Cocaine  06/02/2024 POSITIVE (A)  NONE DETECTED Final   Benzodiazepines 06/02/2024 POSITIVE (A)  NONE DETECTED Final   Amphetamines 06/02/2024 POSITIVE (A)  NONE DETECTED Final  Comment: (NOTE) Trazodone  is metabolized in vivo to several metabolites, including pharmacologically active m-CPP, which is excreted in the urine. Immunoassay screens for amphetamines and MDMA have potential cross-reactivity with these compounds and may provide false positive  results.     Tetrahydrocannabinol 06/02/2024 POSITIVE (A)  NONE DETECTED Final   Barbiturates 06/02/2024 NONE DETECTED  NONE DETECTED Final   Comment: (NOTE) DRUG SCREEN FOR MEDICAL PURPOSES ONLY.  IF CONFIRMATION IS NEEDED FOR ANY PURPOSE, NOTIFY LAB WITHIN 5 DAYS.  LOWEST DETECTABLE LIMITS FOR URINE DRUG SCREEN Drug Class                     Cutoff (ng/mL) Amphetamine and metabolites    1000 Barbiturate and metabolites    200 Benzodiazepine                  200 Opiates and metabolites        300 Cocaine  and metabolites        300 THC                            50 Performed at [facility], [address] [place], [place] [address]   Admission on 05/23/2024, Discharged on 05/23/2024  Component Date Value Ref Range Status   Sodium 05/23/2024 137  135 - 145 mmol/L Final   Potassium 05/23/2024 3.3 (L)  3.5 - 5.1 mmol/L Final   Chloride 05/23/2024 101  98 - 111 mmol/L Final   CO2 05/23/2024 27  22 - 32 mmol/L Final   Glucose, Bld 05/23/2024 105 (H)  70 - 99 mg/dL Final   Glucose reference range applies only to samples taken after fasting for at least 8 hours.   BUN 05/23/2024 12  6 - 20 mg/dL Final   Creatinine, Ser 05/23/2024 0.57 (L)  0.61 - 1.24 mg/dL Final   Calcium 91/78/7974 8.5 (L)  8.9 - 10.3 mg/dL Final   Total Protein 91/78/7974 7.6  6.5 - 8.1 g/dL Final   Albumin  05/23/2024 3.4 (L)  3.5 - 5.0 g/dL Final   AST 91/78/7974 63 (H)  15 - 41 U/L Final   ALT 05/23/2024 38  0 - 44 U/L Final   Alkaline Phosphatase 05/23/2024 104  38 - 126 U/L Final   Total Bilirubin 05/23/2024 0.4  0.0 - 1.2 mg/dL Final   GFR, Estimated 05/23/2024 >60  >60 mL/min Final   Comment: (NOTE) Calculated using the CKD-EPI Creatinine Equation (2021)    Anion gap 05/23/2024 9  5 - 15 Final   Performed at [facility], [address], [place], [place] [address]   Alcohol, Ethyl (B) 05/23/2024 <15  <15 mg/dL Final   Comment: (NOTE) For medical purposes only. Performed at [facility], [address] [place], [place] [address]    WBC 05/23/2024 6.0  4.0 - 10.5 K/uL Final   RBC 05/23/2024 4.14 (L)  4.22 - 5.81 MIL/uL Final   Hemoglobin 05/23/2024 10.6 (L)  13.0 - 17.0 g/dL Final   HCT 91/78/7974 34.5 (L)  39.0 - 52.0 % Final   MCV 05/23/2024 83.3  80.0 - 100.0 fL Final   MCH 05/23/2024 25.6 (L)  26.0 - 34.0 pg Final   MCHC 05/23/2024 30.7  30.0 - 36.0 g/dL Final   RDW 91/78/7974 14.7  11.5 - 15.5 % Final    Platelets 05/23/2024 254  150 - 400 K/uL Final   nRBC 05/23/2024 0.0  0.0 - 0.2 % Final   Performed at [facility], [address] [place], [place] [address]  Admission on 05/10/2024, Discharged on 05/12/2024  Component Date Value Ref Range Status   Hgb A1c MFr Bld 05/11/2024 5.6  4.8 - 5.6 % Final   Comment: (NOTE)         Prediabetes: 5.7 - 6.4         Diabetes: >6.4         Glycemic control for adults with diabetes: <7.0    Mean Plasma Glucose 05/11/2024 114  mg/dL Final   Comment: (NOTE) Performed At: [facility] [address] [place], [place] [id number] Jennette Shorter MD Ey:[phone]    Prolactin 05/11/2024 20.5  3.9 - 22.7 ng/mL Final   Comment: (NOTE) Performed At: [facility] [address] [place], [place] [id number] Jennette Shorter MD Ey:[phone]    RPR Ser Ql 05/11/2024 NON REACTIVE  NON REACTIVE Final   Performed at [facility], [address], [place], [place] [address]   Hepatitis B Surface Ag 05/11/2024 NON REACTIVE  NON REACTIVE Final   HCV Ab 05/11/2024 Reactive (A)  NON REACTIVE Final   Comment: (NOTE) The CDC recommends that a Reactive HCV antibody result be followed up  with a HCV Nucleic Acid Amplification test.     Hep A IgM 05/11/2024 NON REACTIVE  NON REACTIVE Final   Hep B C IgM 05/11/2024 NON REACTIVE  NON REACTIVE Final   Performed at [facility], [address], [place], [place] [address]   HIV Screen 4th Generation wRfx 05/11/2024 Non Reactive  Non Reactive Final   Performed at [facility], [address], [place], [place] [address]   TSH 05/11/2024 0.752  0.350 - 4.500 uIU/mL Final   Comment: Performed by a 3rd Generation assay with a functional sensitivity of <=0.01 uIU/mL. Performed at [facility], [address] [place], [place] [address]   Admission on 05/10/2024, Discharged on 05/10/2024  Component Date Value Ref Range Status   WBC 05/10/2024 8.3  4.0 - 10.5 K/uL Final    RBC 05/10/2024 4.14 (L)  4.22 - 5.81 MIL/uL Final   Hemoglobin 05/10/2024 10.9 (L)  13.0 - 17.0 g/dL Final   HCT 91/91/7974 33.7 (L)  39.0 - 52.0 % Final   MCV 05/10/2024 81.4  80.0 - 100.0 fL Final   MCH 05/10/2024 26.3  26.0 - 34.0 pg Final   MCHC 05/10/2024 32.3  30.0 - 36.0 g/dL Final   RDW 91/91/7974 14.1  11.5 - 15.5 % Final   Platelets 05/10/2024 302  150 - 400 K/uL Final   nRBC 05/10/2024 0.0  0.0 - 0.2 % Final   Neutrophils Relative % 05/10/2024 51  % Final   Neutro Abs 05/10/2024 4.2  1.7 - 7.7 K/uL Final   Lymphocytes Relative 05/10/2024 37  % Final   Lymphs Abs 05/10/2024 3.1  0.7 - 4.0 K/uL Final   Monocytes Relative 05/10/2024 10  % Final   Monocytes Absolute 05/10/2024 0.8  0.1 - 1.0 K/uL Final   Eosinophils Relative 05/10/2024 1  % Final   Eosinophils Absolute 05/10/2024 0.1  0.0 - 0.5 K/uL Final   Basophils Relative 05/10/2024 1  % Final   Basophils Absolute 05/10/2024 0.1  0.0 - 0.1 K/uL Final   Immature Granulocytes 05/10/2024 0  % Final   Abs Immature Granulocytes 05/10/2024 0.03  0.00 - 0.07 K/uL Final   Performed at [facility], [address] [place], [place] [address]   Sodium 05/10/2024 138  135 - 145 mmol/L Final   Potassium 05/10/2024 3.9  3.5 - 5.1 mmol/L Final   Chloride 05/10/2024 101  98 - 111 mmol/L Final   CO2 05/10/2024 24  22 - 32 mmol/L Final   Glucose, Bld 05/10/2024 83  70 - 99 mg/dL Final   Glucose reference range applies only to samples taken after fasting for at least 8 hours.   BUN 05/10/2024 <5 (L)  6 - 20 mg/dL Final   Creatinine, Ser 05/10/2024 0.57 (L)  0.61 - 1.24 mg/dL Final   Calcium 91/91/7974 8.8 (L)  8.9 - 10.3 mg/dL Final   Total Protein 91/91/7974 7.7  6.5 - 8.1 g/dL Final   Albumin  05/10/2024 3.5  3.5 - 5.0 g/dL Final   AST 91/91/7974 87 (H)  15 - 41 U/L Final   ALT 05/10/2024 80 (H)  0 - 44 U/L Final   Alkaline Phosphatase 05/10/2024 121  38 - 126 U/L Final   Total Bilirubin 05/10/2024 0.4  0.0 - 1.2 mg/dL Final    GFR, Estimated 05/10/2024 >60  >60 mL/min Final   Comment: (NOTE) Calculated using the CKD-EPI Creatinine Equation (2021)    Anion gap 05/10/2024 13  5 - 15 Final   Performed at [facility], [address], [place], [place] [address]   Alcohol, Ethyl (B) 05/10/2024 126 (H)  <15 mg/dL Final   Comment: (NOTE) For medical purposes only. Performed at [facility], [address] [place], [place] [address]    Cholesterol 05/10/2024 125  0 - 200 mg/dL Final   Triglycerides 91/91/7974 49  <150 mg/dL Final   HDL 91/91/7974 48  >40 mg/dL Final   Total CHOL/HDL Ratio 05/10/2024 2.6  RATIO Final   VLDL 05/10/2024 10  0 - 40 mg/dL Final   LDL Cholesterol 05/10/2024 67  0 - 99 mg/dL Final   Comment:        Total Cholesterol/HDL:CHD Risk Coronary Heart Disease Risk Table                     Men   Women  1/2 Average Risk   3.4   3.3  Average Risk       5.0   4.4  2 X Average Risk   9.6   7.1  3 X Average Risk  23.4   11.0        Use the calculated Patient Ratio above and the CHD Risk Table to determine the patient's CHD Risk.        ATP III CLASSIFICATION (LDL):  <100     mg/dL   Optimal  899-870  mg/dL   Near or Above                    Optimal  130-159  mg/dL   Borderline  839-810  mg/dL   High  >809     mg/dL   Very High Performed at [facility], [address], [place], [place] [address]    POC Amphetamine UR 05/10/2024 None Detected  NONE DETECTED (Cut Off Level 1000 ng/mL) Final   POC Secobarbital (BAR) 05/10/2024 None Detected  NONE DETECTED (Cut Off Level 300 ng/mL) Final   POC Buprenorphine (BUP) 05/10/2024 None Detected  NONE DETECTED (Cut Off Level 10 ng/mL) Final   POC Oxazepam (BZO) 05/10/2024 None Detected  NONE DETECTED (Cut Off Level 300  ng/mL) Final   POC Cocaine  UR 05/10/2024 Positive (A)  NONE DETECTED (Cut Off Level 300 ng/mL) Final   POC Methamphetamine UR 05/10/2024 Positive (A)  NONE DETECTED (Cut Off Level 1000 ng/mL) Final   POC Morphine   05/10/2024 None Detected  NONE DETECTED (Cut Off Level 300 ng/mL) Final   POC Methadone UR 05/10/2024 None Detected  NONE DETECTED (Cut Off Level 300 ng/mL) Final   POC Oxycodone  UR 05/10/2024 None Detected  NONE DETECTED (Cut Off Level 100 ng/mL) Final   POC Marijuana UR 05/10/2024 None Detected  NONE DETECTED (Cut Off Level 50 ng/mL) Final  Admission on 05/09/2024, Discharged on 05/09/2024  Component Date Value Ref Range Status   Glucose-Capillary 05/09/2024 122 (H)  70 - 99 mg/dL Final   Glucose reference range applies only to samples taken after fasting for at least 8 hours.  Admission on 04/27/2024, Discharged on 04/28/2024  Component Date Value Ref Range Status   WBC 04/27/2024 8.4  4.0 - 10.5 K/uL Final   RBC 04/27/2024 3.91 (L)  4.22 - 5.81 MIL/uL Final   Hemoglobin 04/27/2024 10.3 (L)  13.0 - 17.0 g/dL Final   HCT 92/73/7974 32.3 (L)  39.0 - 52.0 % Final   MCV 04/27/2024 82.6  80.0 - 100.0 fL Final   MCH 04/27/2024 26.3  26.0 - 34.0 pg Final   MCHC 04/27/2024 31.9  30.0 - 36.0 g/dL Final   RDW 92/73/7974 13.5  11.5 - 15.5 % Final   Platelets 04/27/2024 299  150 - 400 K/uL Final   nRBC 04/27/2024 0.0  0.0 - 0.2 % Final   Neutrophils Relative % 04/27/2024 63  % Final   Neutro Abs 04/27/2024 5.4  1.7 - 7.7 K/uL Final   Lymphocytes Relative 04/27/2024 22  % Final   Lymphs Abs 04/27/2024 1.8  0.7 - 4.0 K/uL Final   Monocytes Relative 04/27/2024 13  % Final   Monocytes Absolute 04/27/2024 1.1 (H)  0.1 - 1.0 K/uL Final   Eosinophils Relative 04/27/2024 0  % Final   Eosinophils Absolute 04/27/2024 0.0  0.0 - 0.5 K/uL Final   Basophils Relative 04/27/2024 1  % Final   Basophils Absolute 04/27/2024 0.1  0.0 - 0.1 K/uL Final   Immature Granulocytes 04/27/2024 1  % Final   Abs Immature Granulocytes 04/27/2024 0.04  0.00 - 0.07 K/uL Final   Performed at [facility], [address], [place], [place] [address]   Sodium 04/27/2024 132 (L)  135 - 145 mmol/L Final    Potassium 04/27/2024 3.6  3.5 - 5.1 mmol/L Final   Chloride 04/27/2024 98  98 - 111 mmol/L Final   CO2 04/27/2024 25  22 - 32 mmol/L Final   Glucose, Bld 04/27/2024 128 (H)  70 - 99 mg/dL Final   Glucose reference range applies only to samples taken after fasting for at least 8 hours.   BUN 04/27/2024 13  6 - 20 mg/dL Final   Creatinine, Ser 04/27/2024 0.52 (L)  0.61 - 1.24 mg/dL Final   Calcium 92/73/7974 8.9  8.9 - 10.3 mg/dL Final   GFR, Estimated 04/27/2024 >60  >60 mL/min Final   Comment: (NOTE) Calculated using the CKD-EPI Creatinine Equation (2021)    Anion gap 04/27/2024 9  5 - 15 Final   Performed at [facility], [address], [place], [place] [address]   Alcohol, Ethyl (B) 04/27/2024 <15  <15 mg/dL Final   Comment: (NOTE) For medical purposes only. Performed at [facility], [address], [place], [place] [address]    Opiates 04/27/2024 POSITIVE (A)  NONE DETECTED Final   Cocaine  04/27/2024 POSITIVE (A)  NONE DETECTED Final   Benzodiazepines 04/27/2024 NONE DETECTED  NONE DETECTED Final   Amphetamines 04/27/2024 NONE DETECTED  NONE DETECTED Final   Tetrahydrocannabinol 04/27/2024 POSITIVE (A)  NONE DETECTED Final   Barbiturates 04/27/2024 NONE DETECTED  NONE DETECTED Final   Comment: (NOTE) DRUG SCREEN FOR MEDICAL PURPOSES ONLY.  IF CONFIRMATION IS NEEDED FOR ANY PURPOSE, NOTIFY LAB WITHIN 5 DAYS.  LOWEST DETECTABLE LIMITS FOR URINE DRUG SCREEN Drug Class                     Cutoff (ng/mL) Amphetamine and metabolites    1000 Barbiturate and metabolites    200 Benzodiazepine                 200 Opiates and metabolites        300 Cocaine  and metabolites        300 THC                            50 Performed at [facility], [address] [place], [place] [address]   There may be more visits with results that are not included.    Blood Alcohol level:  Lab Results  Component Value Date   ETH <15  08/26/2024   ETH 35 (H) 08/08/2024    Metabolic Disorder Labs: Lab Results  Component Value Date   HGBA1C 5.6 08/26/2024   MPG 114 08/26/2024   MPG 114 05/11/2024   Lab Results  Component Value Date   PROLACTIN 20.5 05/11/2024   PROLACTIN 31.4 (H) 11/16/2019   Lab Results  Component Value Date   CHOL 125 05/10/2024   TRIG 49 05/10/2024   HDL 48 05/10/2024   CHOLHDL 2.6 05/10/2024   VLDL 10 05/10/2024   LDLCALC 67 05/10/2024   LDLCALC 51 12/08/2023    Therapeutic Lab Levels: No results found for: LITHIUM No results found for: VALPROATE No results found for: CBMZ  Physical Findings   AIMS    Flowsheet Row [facility]) [facility] (Dis[facility]) [facility] Ad[facility] ([facility]) [facility]  AIMS Total Score 0 0 0   AUDIT    Flowsheet Row ED from 05/10/2024 in [facility] Admission (Discharged) [facility] Ad[facility] (Discharged) [facility] Ad[facility] (Discharged) from OP Visit [facility] Ad[facility] (Discharged) [facility]  Alcohol Use Disorder Identification Test Final Score (AUDIT) 31 26 26 13  37   PHQ2-9    Flowsheet Row ED from 08/26/2024 in [facility] ED from 05[facility]10/2024 in [facility] ED from 01/14/2024 in [facility] ED from 12/08/2023 in [facility] ED from 11/21/2023 in [facility]  PHQ-2 Total Score 2 4 0 0 2  PHQ-9 Total Score 9 12 14  0 8   Flowsheet Row ED from 08/26/2024 in [facility] Most recent reading at 08/26/2024 12:46 PM ED from 08/26/2024 in [facility] Most  recent reading at 08/26/2024  8:19 AM ED from 08/14/2024 in [facility] Most recent reading at 08/14/2024  9:25 AM  C-SSRS RISK CATEGORY Moderate Risk Error: Q3, 4, or 5 should not be populated when Q2 is No Error: Q7 should not be populated when Q6 is No     Psychiatric Specialty Exam: Physical Exam Constitutional:      Appearance: the patient is not toxic-appearing.  Pulmonary:     Effort: Pulmonary effort is normal.  Neurological:     General: No focal deficit present.     Mental Status: the patient is alert and oriented to person, place, and time.   Review of Systems  Respiratory:  Negative for shortness of breath.   Cardiovascular:  Negative for chest pain.  Gastrointestinal:  Negative for abdominal pain, constipation, diarrhea, nausea and vomiting.  Neurological:  Negative for headaches.      BP 100/66   Pulse 65   Temp 98.2 F (36.8 C) (Oral)   Resp 18   SpO2 99%   General Appearance: Fairly Groomed  Eye Contact:  Good  Speech:  Clear and Coherent  Volume:  Normal  Mood:  Euthymic  Affect:  Congruent  Thought Process:  Coherent  Orientation:  Full (Time, Place, and Person)  Thought Content: Logical   Suicidal Thoughts:  No  Homicidal Thoughts:  No  Memory:  Immediate;   Good  Judgement:  fair  Insight:  fair  Psychomotor Activity:  Normal  Concentration:  Concentration: Good  Recall:  Good  Fund of Knowledge: Good  Language: Good  Akathisia:  No  Handed:  not assessed  AIMS (if indicated): not done  Assets:  Communication Skills Desire for Improvement Leisure Time Physical Health  ADL's:  Intact  Cognition: WNL      Treatment Plan Summary: Daily contact with patient to assess and evaluate symptoms and progress in treatment and Medication management.  Status: Voluntary   Alcohol use disorder, opioid  use disorder, stimulant use disorder, currently in withdrawal; history schizoaffective disorder - CIWA with as needed Ativan  - Comfort as needed medication - Seroquel  50 mg twice daily targeting auditory hallucinations, anxiety, and insomnia - Will follow-up with [facility] IOP on 12/3     Labs and EKG reviewed, unremarkable.  LFT elevation, likely hep C versus alcoholic hepatitis    Karleen Kaufmann, MD 08/28/2024 1:28 PM

## 2024-08-28 NOTE — ED Notes (Signed)
 Patient alert & oriented x4. Pt on unit throughout shift q15 checks completed. Pt denies intent to harm self or others. Denies AVH. No signs of acute distress noted. No inappropriate behaviors observed or reported. Encouraged patient to notify staff if any thoughts of harm towards self or others arise. Patient verbalizes understanding and agreement. Eating and fluid intake were adequate. Medications administered as ordered; no adverse effects noted.

## 2024-08-28 NOTE — ED Notes (Signed)
 Patient is in the bedroom calm and sleeping. NAD. Will continue to monitor for safety.

## 2024-08-28 NOTE — ED Notes (Signed)
Patient given snack.  

## 2024-08-28 NOTE — ED Notes (Signed)
 Paitent provided dinner.

## 2024-08-28 NOTE — Group Note (Signed)
 Group Topic: Recovery Basics  Group Date: 08/28/2024 Start Time: 2000 End Time: 2045 Facilitators: Nathanael Moulder, NT  Department: [facility]  Number of Participants: 8  Group Focus: relapse prevention, social skills, and substance abuse education Treatment Modality:  Psychoeducation and Skills Training Interventions utilized were exploration, reminiscence, story telling, and support Purpose: enhance coping skills, express feelings, relapse prevention strategies, and trigger / craving management  Name: Mark Hammond Date of Birth: 15-Apr-1991  MR: [id number]    Level of Participation: Didn't attend Quality of Participation: N/A Interactions with others: N/A Mood/Affect: N/A Triggers (if applicable): N/A Cognition: N/A Progress: N/A Response: N/A Plan: patient will be encouraged to attend future groups  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   Polysubstance dependence (HCC) 12/13/2023   Polysubstance abuse (HCC) 11/21/2023   Alcohol use disorder 08/04/2023   Stimulant use disorder 08/04/2023   Tobacco use disorder 08/04/2023   Cannabis use disorder 08/04/2023   Hepatitis C 03/01/2023   Anxiety state 01/14/2023   Insomnia 01/14/2023   Schizoaffective disorder (HCC) 01/13/2023   Schizophrenia (HCC) 11/15/2019   Substance induced mood disorder (HCC) 10/24/2019   Methamphetamine use disorder, 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

## 2024-08-28 NOTE — ED Notes (Signed)
 Paitent provided breakfast.

## 2024-08-28 NOTE — ED Notes (Signed)
 Patient alert, Mood appears calm. Respirations even and unlabored. No acute distress observed.  Resting in room asleep. Environment secured. Poc ongoing

## 2024-08-28 NOTE — Group Note (Signed)
 Group Topic: Relapse and Recovery  Group Date: 08/28/2024 Start Time: 1330 End Time: 1400 Facilitators: Dmarius Reeder, Zane HERO, RN; Reinhold, Comfort C, RN  Department: [facility]  Number of Participants: 1  Group Focus: nursing group Treatment Modality:  Individual Therapy Interventions utilized were patient education and support Purpose: increase insight  Name: Mark Hammond Date of Birth: [birth year]  MR: [id number]    Level of Participation: moderate Quality of Participation: cooperative Interactions with others: gave feedback Mood/Affect: appropriate Triggers (if applicable): None identified at this time Cognition: coherent/clear Progress: Gaining insight Response: Patient voices understanding of medications as administered, no concerns voiced regarding unit stay. Patient voices understanding of who to speak with in case of future concerns.  Plan: patient will be encouraged to continue to attend groups/programming on the unit.  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   Polysubstance dependence (HCC) 12/13/2023   Polysubstance abuse (HCC) 11/21/2023   Alcohol use disorder 08/04/2023   Stimulant use disorder 08/04/2023   Tobacco use disorder 08/04/2023   Cannabis use disorder 08/04/2023   Hepatitis C 03/01/2023   Anxiety state 01/14/2023   Insomnia 01/14/2023   Schizoaffective disorder (HCC) 01/13/2023   Schizophrenia (HCC) 11/15/2019   Substance induced  mood disorder (HCC) 10/24/2019   Methamphetamine use disorder, 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

## 2024-08-28 NOTE — Care Management (Signed)
 [facility] Care Management:  Writer met with the client and informed him he will be discharged tomorrow.  Client is requesting a taxi to his uncle's address which is [address] [place], [place].    Writer completed a taxi voucher and added it to the client's chart. RN will call the taxi for tomorrow morning at 10am.  Client has a CD-IOP at 8:30am at [facility].

## 2024-08-28 NOTE — ED Notes (Signed)
 Paitent provided lunch.

## 2024-08-29 DIAGNOSIS — F1220 Cannabis dependence, uncomplicated: Secondary | ICD-10-CM | POA: Diagnosis not present

## 2024-08-29 DIAGNOSIS — F10239 Alcohol dependence with withdrawal, unspecified: Secondary | ICD-10-CM | POA: Diagnosis not present

## 2024-08-29 DIAGNOSIS — F1123 Opioid dependence with withdrawal: Secondary | ICD-10-CM | POA: Diagnosis not present

## 2024-08-29 DIAGNOSIS — F1523 Other stimulant dependence with withdrawal: Secondary | ICD-10-CM | POA: Diagnosis not present

## 2024-08-29 MED ORDER — ADULT MULTIVITAMIN W/MINERALS CH: 1.0000 | ORAL_TABLET | Freq: Every day | ORAL | 0 refills | Status: AC

## 2024-08-29 MED ORDER — QUETIAPINE FUMARATE 50 MG PO TABS
50.0000 mg | ORAL_TABLET | Freq: Two times a day (BID) | ORAL | 0 refills | Status: AC
Start: 2024-08-29 — End: ?

## 2024-08-29 NOTE — ED Notes (Signed)
 Patient alert & oriented x4. Pt on unit throughout shift q15 checks completed. Pt denies intent to harm self or others. Denies AVH. No signs of acute distress noted. No inappropriate behaviors observed or reported. Encouraged patient to notify staff if any thoughts of harm towards self or others arise. Patient verbalizes understanding and agreement. Eating and fluid intake were adequate. Medications administered as ordered; no adverse effects noted. Pt discharged to home, bus pass provided. Patient verbalized understanding of discharge instructions and was provided a copy. All questions were addressed prior to discharge.

## 2024-08-29 NOTE — ED Notes (Signed)
 Patient is sleeping in the bedroom, NAD Will continue to monitor for safety.

## 2024-08-29 NOTE — ED Notes (Signed)
 Paitent provided breakfast.

## 2024-08-29 NOTE — ED Provider Notes (Signed)
 FBC/OBS ASAP Discharge Summary  Date and Time: 08/29/2024 7:37 AM  Name: Mark Hammond  MRN:  [id number]   Discharge Diagnoses:  Final diagnoses:  Opioid use with withdrawal (HCC)  Alcohol use disorder, severe, in controlled environment (HCC)  Moderate tetrahydrocannabinol (THC) dependence (HCC)  Auditory hallucinations  Stimulant use disorder  Elevated liver enzymes  Substance induced mood disorder (HCC)    Subjective: The patient is a 33 year old male with a long history of multiple substance use disorders (opioid, stimulant, alcohol) who has been seen at this facility numerous times. On the present occasion he presented to the [facility] requesting detox from substances. He was admitted to the facility based crisis.    The patient is pleasant today. He is able to discuss his discharge plan in detail: plans to stay at his uncle's house and attend [facility] initial eval on 12/3. Agreeable to discharge tomorrow morning.    The patient denies auditory/visual hallucinations.  The patient reports good mood, appetite, and sleep. They deny suicidal and homicidal thoughts. The patient denies side effects from their medications.  Review of systems as below. The patient denies experiencing any withdrawal symptoms.   Stay Summary:  The patient was admitted to the [facility]-based Crisis unit due to symptoms of acute alcohol withdrawal. Patient was restricted to ward and placed on suicide precautions. Patient was introduced to milieu activities and encouraged to participate in psycho-social groups. The plan at the time of admission was safety, stabilization and treatment.  Patient was treated with a combination of medication, therapy, and some educational activities during his unit stay.  During his hospitalization, patient was evaluated for symptoms of acute alcohol withdrawal and was place on Librium  taper to manage symptoms; also observed for mood instability, psychosis -  continued on home Seroquel  50mg  twice daily for maintenance of Schizoaffective disorder.  At the time of discharge, patient did not express any symptoms of acute alcohol withdrawal (CIWA - 0) and was able to manage his mood. Patient is interested in continuing addiction treatment at [facility].  The patient was discharged in stable condition with the following recommendations:  Medication: Continue Seroquel  50 mg daily for mood stabilization and management of psychosis. Continue multivitamins for health maintenance taking into consideration alcohol use disorder.  Follow-up: The patient is scheduled for a an intake at [facility] 12/3.  Instructions: He was advised to continue taking his medication as prescribed and to monitor for any potential side effects. Additionally, he was encouraged to maintain a regular sleep routine and avoid alcohol or illicit substances.  Support: The patient was advised to stay in contact with his family and support system, including his wife and close relatives, to ensure continued care and emotional support.  In summary, the patient has made significant progress, with resolution of withdrawal symptoms and mood improvement. He was discharged with a solid plan for continued care and follow-up to ensure ongoing stability.  Total Time spent with patient: 45 minutes  Past Psychiatric History: schizoaffective disorder, alcohol use disorder   Current Medications:  Current Facility-Administered Medications  Medication Dose Route Frequency Provider Last Rate Last Admin   acetaminophen  (TYLENOL ) tablet 650 mg  650 mg Oral Q6H PRN Marry Clamp, MD   650 mg at 08/28/24 0919   alum & mag hydroxide-simeth (MAALOX/MYLANTA) 200-200-20 MG/5ML suspension 30 mL  30 mL Oral Q4H PRN Marry Clamp, MD       haloperidol  (HALDOL ) tablet 5 mg  5 mg Oral TID PRN Marry Clamp,  MD       And   diphenhydrAMINE  (BENADRYL ) capsule 50 mg  50 mg Oral TID PRN Marry Clamp, MD        haloperidol  lactate (HALDOL ) injection 5 mg  5 mg Intramuscular TID PRN Marry Clamp, MD       And   diphenhydrAMINE  (BENADRYL ) injection 50 mg  50 mg Intramuscular TID PRN Marry Clamp, MD       And   LORazepam  (ATIVAN ) injection 2 mg  2 mg Intramuscular TID PRN Marry Clamp, MD       haloperidol  lactate (HALDOL ) injection 10 mg  10 mg Intramuscular TID PRN Marry Clamp, MD       And   diphenhydrAMINE  (BENADRYL ) injection 50 mg  50 mg Intramuscular TID PRN Marry Clamp, MD       And   LORazepam  (ATIVAN ) injection 2 mg  2 mg Intramuscular TID PRN Marry Clamp, MD       hydrOXYzine  (ATARAX ) tablet 25 mg  25 mg Oral TID PRN Marry Clamp, MD       loperamide  (IMODIUM ) capsule 2-4 mg  2-4 mg Oral PRN Marry Clamp, MD       LORazepam  (ATIVAN ) tablet 1 mg  1 mg Oral Q4H PRN Marry Clamp, MD       magnesium  hydroxide (MILK OF MAGNESIA) suspension 30 mL  30 mL Oral Daily PRN Marry Clamp, MD       multivitamin with minerals tablet 1 tablet  1 tablet Oral Daily Marry Clamp, MD   1 tablet at 08/28/24 0919   nicotine  (NICODERM CQ  - dosed in mg/24 hours) patch 21 mg  21 mg Transdermal Q0600 Marry Clamp, MD       ondansetron  (ZOFRAN -ODT) disintegrating tablet 4 mg  4 mg Oral Q6H PRN Marry Clamp, MD       QUEtiapine  (SEROQUEL ) tablet 50 mg  50 mg Oral BID Marry Clamp, MD   50 mg at 08/28/24 2111   Current Outpatient Medications  Medication Sig Dispense Refill   Multiple Vitamin (MULTIVITAMIN WITH MINERALS) TABS tablet Take 1 tablet by mouth daily. 30 tablet 0   QUEtiapine  (SEROQUEL ) 50 MG tablet Take 1 tablet (50 mg total) by mouth 2 (two) times daily. 60 tablet 0    PTA Medications:  Facility Ordered Medications  Medication   QUEtiapine  (SEROQUEL ) tablet 50 mg   [COMPLETED] ibuprofen  (ADVIL ) tablet 600 mg   [COMPLETED] loperamide  (IMODIUM ) capsule 4 mg   [COMPLETED] acetaminophen  (TYLENOL ) tablet 650 mg   [COMPLETED] dicyclomine  (BENTYL ) capsule 10 mg    acetaminophen  (TYLENOL ) tablet 650 mg   alum & mag hydroxide-simeth (MAALOX/MYLANTA) 200-200-20 MG/5ML suspension 30 mL   magnesium  hydroxide (MILK OF MAGNESIA) suspension 30 mL   haloperidol  (HALDOL ) tablet 5 mg   And   diphenhydrAMINE  (BENADRYL ) capsule 50 mg   haloperidol  lactate (HALDOL ) injection 5 mg   And   diphenhydrAMINE  (BENADRYL ) injection 50 mg   And   LORazepam  (ATIVAN ) injection 2 mg   haloperidol  lactate (HALDOL ) injection 10 mg   And   diphenhydrAMINE  (BENADRYL ) injection 50 mg   And   LORazepam  (ATIVAN ) injection 2 mg   hydrOXYzine  (ATARAX ) tablet 25 mg   nicotine  (NICODERM CQ  - dosed in mg/24 hours) patch 21 mg   [COMPLETED] dextromethorphan -guaiFENesin  (MUCINEX  DM) 30-600 MG per 12 hr tablet 1 tablet   LORazepam  (ATIVAN ) tablet 1 mg   multivitamin with minerals tablet 1 tablet   ondansetron  (ZOFRAN -ODT) disintegrating tablet 4 mg   loperamide  (IMODIUM )  capsule 2-4 mg   PTA Medications  Medication Sig   QUEtiapine  (SEROQUEL ) 50 MG tablet Take 1 tablet (50 mg total) by mouth 2 (two) times daily.   Multiple Vitamin (MULTIVITAMIN WITH MINERALS) TABS tablet Take 1 tablet by mouth daily.       08/26/2024    3:35 PM 05/12/2024   12:00 PM 05/12/2024   10:00 AM  Depression screen PHQ 2/9  Decreased Interest 1 2 2   Down, Depressed, Hopeless 1 2 2   PHQ - 2 Score 2 4 4   Altered sleeping 1 3 3   Tired, decreased energy 1 2 2   Change in appetite 1 1 1   Feeling bad or failure about yourself  1 2 2   Trouble concentrating 1 0 0  Moving slowly or fidgety/restless 1 0 0  Suicidal thoughts 1 0 0  PHQ-9 Score 9 12  12    Difficult doing work/chores  Somewhat difficult Somewhat difficult     Data saved with a previous flowsheet row definition    Flowsheet Row ED from 08/26/2024 in [facility] Most recent reading at 08/26/2024 12:46 PM ED from 08/26/2024 in [facility] Most recent reading at 08/26/2024  8:19 AM  ED from 08/14/2024 in [facility] Most recent reading at 08/14/2024  9:25 AM  C-SSRS RISK CATEGORY Moderate Risk Error: Q3, 4, or 5 should not be populated when Q2 is No Error: Q7 should not be populated when Q6 is No    Musculoskeletal  Strength & Muscle Tone: within normal limits Gait & Station: normal Patient leans: N/A  Psychiatric Specialty Exam  Presentation  General Appearance:  Casual  Eye Contact: Fair  Speech: Clear and Coherent  Speech Volume: Normal  Handedness: Right   Mood and Affect  Mood: Euthymic  Affect: Congruent   Thought Process  Thought Processes: Coherent  Descriptions of Associations:Intact  Orientation:Full (Time, Place and Person)  Thought Content:WDL  Diagnosis of Schizophrenia or Schizoaffective disorder in past: No  Duration of Psychotic Symptoms: Greater than six months   Hallucinations:No data recorded Ideas of Reference:None  Suicidal Thoughts:No data recorded Homicidal Thoughts:No data recorded  Sensorium  Memory: Immediate Fair  Judgment: Fair  Insight: Fair   Art Therapist  Concentration: Fair  Attention Span: Fair  Recall: Fair  Fund of Knowledge: Fair  Language: Fair   Psychomotor Activity  Psychomotor Activity:No data recorded  Assets  Assets: Communication Skills   Sleep  Sleep:No data recorded Estimated Sleeping Duration (Last 24 Hours): 15.75-16.50 hours  No data recorded  Physical Exam  Physical Exam ROS Blood pressure 110/71, pulse 63, temperature 97.8 F (36.6 C), temperature source Temporal, resp. rate 14, SpO2 98%. There is no height or weight on file to calculate BMI. Physical/General: alert, NAD. Skin: no rashes. HEENT:  Normocephalic, atraumatic, PERRLA.  Trunk/Extremities: no gross abnormalities evident Pulmonary: Pulmonary effort is normal.  Neuro: grossly non-focal, dyskinesias not evident, gait appears in full range. Mental  Status: He is alert.  Other Medical: N/A  Vitals and nursing note reviewed.    Review of Systems: Respiratory:  Negative for shortness of breath.   Cardiovascular:  Negative for chest pain.  Gastrointestinal:  Negative for diarrhea, nausea and vomiting.  Neurological:  Negative for dizziness and headaches.    [place] Suicide Severity Rating Scale  Wish to be dead: No  Suicidal thoughts: No                  Suicidal thoughts  with method: No                  Suicidal intent: No                  Suicide intent with specific plan: No                  Suicide behavior: No  Suicide Risk:  Minimal: No identifiable suicidal ideation.  Patients presenting with no risk factors but with morbid ruminations; may be classified as minimal risk based on the severity of the depressive symptoms  Plan Of Care/Follow-up recommendations:  Emergency Contact Plan: Patient understands to call Suicide Hotline at 42, or Mobile Crisis at [phone], call 911, or go to the nearest ER as appropriate in case of thoughts of harm to self or others, or acute onset of intolerable side effects.  Disposition: home  Neil Appl, MD 08/29/2024, 7:37 AM

## 2024-09-04 ENCOUNTER — Telehealth (HOSPITAL_COMMUNITY): Payer: Self-pay | Admitting: Licensed Clinical Social Worker

## 2024-09-04 ENCOUNTER — Ambulatory Visit (HOSPITAL_COMMUNITY): Payer: MEDICAID

## 2024-09-04 NOTE — Telephone Encounter (Signed)
 After Leemon no show's for his assessment today, the therapist attempts to reach him leaving a HIPAA-compliant voicemail.  Zell Maier, MA, LCSW, [facility], LCAS 09/04/2024

## 2024-11-05 ENCOUNTER — Other Ambulatory Visit (HOSPITAL_COMMUNITY): Payer: Self-pay
